# Patient Record
Sex: Male | Born: 1975
Health system: Southern US, Community
[De-identification: ages and names within clinical notes are randomized; demographics above are authoritative.]

## PROBLEM LIST (undated history)

## (undated) DIAGNOSIS — I10 Essential (primary) hypertension: Secondary | ICD-10-CM

## (undated) DIAGNOSIS — G4730 Sleep apnea, unspecified: Secondary | ICD-10-CM

## (undated) DIAGNOSIS — D649 Anemia, unspecified: Secondary | ICD-10-CM

## (undated) DIAGNOSIS — E038 Other specified hypothyroidism: Secondary | ICD-10-CM

## (undated) DIAGNOSIS — I509 Heart failure, unspecified: Secondary | ICD-10-CM

## (undated) DIAGNOSIS — K219 Gastro-esophageal reflux disease without esophagitis: Secondary | ICD-10-CM

## (undated) DIAGNOSIS — E119 Type 2 diabetes mellitus without complications: Secondary | ICD-10-CM

## (undated) DIAGNOSIS — N186 End stage renal disease: Secondary | ICD-10-CM

## (undated) DIAGNOSIS — I480 Paroxysmal atrial fibrillation: Secondary | ICD-10-CM

## (undated) DIAGNOSIS — Z9289 Personal history of other medical treatment: Secondary | ICD-10-CM

## (undated) DIAGNOSIS — N189 Chronic kidney disease, unspecified: Secondary | ICD-10-CM

## (undated) DIAGNOSIS — R7303 Prediabetes: Secondary | ICD-10-CM

## (undated) HISTORY — DX: Morbid (severe) obesity due to excess calories: E66.01

## (undated) HISTORY — PX: KNEE ARTHROSCOPY: SHX127

## (undated) HISTORY — DX: Chronic kidney disease, unspecified: N18.9

## (undated) HISTORY — DX: Paroxysmal atrial fibrillation: I48.0

## (undated) HISTORY — DX: Essential (primary) hypertension: I10

## (undated) HISTORY — DX: Anemia, unspecified: D64.9

## (undated) HISTORY — DX: Type 2 diabetes mellitus without complications: E11.9

## (undated) HISTORY — DX: Other specified hypothyroidism: E03.8

## (undated) HISTORY — DX: Heart failure, unspecified: I50.9

---
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
## 2000-05-05 ENCOUNTER — Encounter: Payer: Self-pay | Admitting: Emergency Medicine

## 2000-05-05 ENCOUNTER — Inpatient Hospital Stay (HOSPITAL_COMMUNITY): Admission: EM | Admit: 2000-05-05 | Discharge: 2000-05-08 | Payer: Self-pay | Admitting: Emergency Medicine

## 2003-03-04 ENCOUNTER — Encounter: Payer: Self-pay | Admitting: Emergency Medicine

## 2003-03-05 ENCOUNTER — Observation Stay (HOSPITAL_COMMUNITY): Admission: AD | Admit: 2003-03-05 | Discharge: 2003-03-05 | Payer: Self-pay | Admitting: Emergency Medicine

## 2003-03-05 ENCOUNTER — Encounter: Payer: Self-pay | Admitting: Specialist

## 2003-09-25 ENCOUNTER — Inpatient Hospital Stay (HOSPITAL_COMMUNITY): Admission: AD | Admit: 2003-09-25 | Discharge: 2003-09-27 | Payer: Self-pay | Admitting: Nephrology

## 2003-10-19 ENCOUNTER — Emergency Department (HOSPITAL_COMMUNITY): Admission: EM | Admit: 2003-10-19 | Discharge: 2003-10-19 | Payer: Self-pay | Admitting: Emergency Medicine

## 2006-06-15 ENCOUNTER — Ambulatory Visit: Payer: Self-pay | Admitting: Emergency Medicine

## 2006-06-15 ENCOUNTER — Inpatient Hospital Stay (HOSPITAL_COMMUNITY): Admission: EM | Admit: 2006-06-15 | Discharge: 2006-06-20 | Payer: Self-pay | Admitting: Emergency Medicine

## 2006-06-19 ENCOUNTER — Encounter: Payer: Self-pay | Admitting: Vascular Surgery

## 2006-06-24 ENCOUNTER — Ambulatory Visit (HOSPITAL_COMMUNITY): Admission: RE | Admit: 2006-06-24 | Discharge: 2006-06-24 | Payer: Self-pay | Admitting: Vascular Surgery

## 2006-08-31 ENCOUNTER — Ambulatory Visit: Payer: Self-pay | Admitting: Internal Medicine

## 2006-10-16 ENCOUNTER — Ambulatory Visit: Payer: Self-pay | Admitting: Surgical

## 2006-10-16 ENCOUNTER — Ambulatory Visit (HOSPITAL_COMMUNITY): Admission: RE | Admit: 2006-10-16 | Discharge: 2006-10-16 | Payer: Self-pay | Admitting: Vascular Surgery

## 2006-10-19 ENCOUNTER — Ambulatory Visit (HOSPITAL_COMMUNITY): Admission: RE | Admit: 2006-10-19 | Discharge: 2006-10-19 | Payer: Self-pay | Admitting: Vascular Surgery

## 2006-11-13 ENCOUNTER — Ambulatory Visit (HOSPITAL_COMMUNITY): Admission: RE | Admit: 2006-11-13 | Discharge: 2006-11-13 | Payer: Self-pay | Admitting: Nephrology

## 2006-12-16 ENCOUNTER — Ambulatory Visit (HOSPITAL_COMMUNITY): Admission: RE | Admit: 2006-12-16 | Discharge: 2006-12-16 | Payer: Self-pay | Admitting: Vascular Surgery

## 2006-12-16 ENCOUNTER — Ambulatory Visit: Payer: Self-pay | Admitting: Vascular Surgery

## 2007-07-31 ENCOUNTER — Ambulatory Visit (HOSPITAL_COMMUNITY): Admission: RE | Admit: 2007-07-31 | Discharge: 2007-07-31 | Payer: Self-pay | Admitting: Vascular Surgery

## 2007-07-31 ENCOUNTER — Ambulatory Visit: Payer: Self-pay | Admitting: Vascular Surgery

## 2007-08-30 ENCOUNTER — Ambulatory Visit: Payer: Self-pay | Admitting: Surgery

## 2007-09-03 ENCOUNTER — Ambulatory Visit (HOSPITAL_COMMUNITY): Admission: RE | Admit: 2007-09-03 | Discharge: 2007-09-03 | Payer: Self-pay | Admitting: Surgery

## 2007-09-03 ENCOUNTER — Ambulatory Visit: Payer: Self-pay | Admitting: Vascular Surgery

## 2007-10-11 ENCOUNTER — Ambulatory Visit (HOSPITAL_COMMUNITY): Admission: RE | Admit: 2007-10-11 | Discharge: 2007-10-11 | Payer: Self-pay | Admitting: Nephrology

## 2007-11-19 ENCOUNTER — Ambulatory Visit (HOSPITAL_COMMUNITY): Admission: RE | Admit: 2007-11-19 | Discharge: 2007-11-19 | Payer: Self-pay | Admitting: Nephrology

## 2007-11-21 ENCOUNTER — Ambulatory Visit: Payer: Self-pay | Admitting: Vascular Surgery

## 2007-11-21 ENCOUNTER — Ambulatory Visit (HOSPITAL_COMMUNITY): Admission: EM | Admit: 2007-11-21 | Discharge: 2007-11-21 | Payer: Self-pay | Admitting: Emergency Medicine

## 2008-02-19 ENCOUNTER — Ambulatory Visit: Payer: Self-pay | Admitting: Vascular Surgery

## 2008-02-19 ENCOUNTER — Ambulatory Visit (HOSPITAL_COMMUNITY): Admission: RE | Admit: 2008-02-19 | Discharge: 2008-02-19 | Payer: Self-pay | Admitting: Vascular Surgery

## 2008-03-15 ENCOUNTER — Ambulatory Visit: Payer: Self-pay | Admitting: Vascular Surgery

## 2008-03-27 ENCOUNTER — Ambulatory Visit: Payer: Self-pay | Admitting: Vascular Surgery

## 2008-03-27 ENCOUNTER — Ambulatory Visit (HOSPITAL_COMMUNITY): Admission: RE | Admit: 2008-03-27 | Discharge: 2008-03-27 | Payer: Self-pay | Admitting: *Deleted

## 2008-04-22 ENCOUNTER — Inpatient Hospital Stay (HOSPITAL_COMMUNITY): Admission: EM | Admit: 2008-04-22 | Discharge: 2008-04-26 | Payer: Self-pay | Admitting: Emergency Medicine

## 2008-05-10 ENCOUNTER — Ambulatory Visit: Payer: Self-pay | Admitting: Vascular Surgery

## 2008-09-27 ENCOUNTER — Ambulatory Visit (HOSPITAL_COMMUNITY): Admission: RE | Admit: 2008-09-27 | Discharge: 2008-09-27 | Payer: Self-pay | Admitting: Vascular Surgery

## 2008-09-27 ENCOUNTER — Ambulatory Visit: Payer: Self-pay | Admitting: Vascular Surgery

## 2008-09-28 ENCOUNTER — Ambulatory Visit (HOSPITAL_COMMUNITY): Admission: RE | Admit: 2008-09-28 | Discharge: 2008-09-28 | Payer: Self-pay | Admitting: Vascular Surgery

## 2008-12-15 ENCOUNTER — Ambulatory Visit (HOSPITAL_COMMUNITY): Admission: RE | Admit: 2008-12-15 | Discharge: 2008-12-15 | Payer: Self-pay | Admitting: Vascular Surgery

## 2008-12-15 ENCOUNTER — Ambulatory Visit: Payer: Self-pay | Admitting: Surgery

## 2008-12-18 ENCOUNTER — Ambulatory Visit (HOSPITAL_COMMUNITY): Admission: RE | Admit: 2008-12-18 | Discharge: 2008-12-18 | Payer: Self-pay | Admitting: *Deleted

## 2009-01-19 ENCOUNTER — Ambulatory Visit: Payer: Self-pay | Admitting: Vascular Surgery

## 2009-01-19 ENCOUNTER — Ambulatory Visit (HOSPITAL_COMMUNITY): Admission: RE | Admit: 2009-01-19 | Discharge: 2009-01-19 | Payer: Self-pay | Admitting: Vascular Surgery

## 2009-02-06 ENCOUNTER — Inpatient Hospital Stay (HOSPITAL_COMMUNITY): Admission: AD | Admit: 2009-02-06 | Discharge: 2009-02-10 | Payer: Self-pay | Admitting: Nephrology

## 2009-02-08 ENCOUNTER — Encounter (INDEPENDENT_AMBULATORY_CARE_PROVIDER_SITE_OTHER): Payer: Self-pay | Admitting: Nephrology

## 2009-02-08 ENCOUNTER — Ambulatory Visit: Payer: Self-pay | Admitting: Surgery

## 2009-03-22 ENCOUNTER — Ambulatory Visit (HOSPITAL_COMMUNITY): Admission: RE | Admit: 2009-03-22 | Discharge: 2009-03-22 | Payer: Self-pay | Admitting: Nephrology

## 2009-04-10 ENCOUNTER — Ambulatory Visit (HOSPITAL_COMMUNITY): Admission: RE | Admit: 2009-04-10 | Discharge: 2009-04-10 | Payer: Self-pay | Admitting: Nephrology

## 2009-05-26 ENCOUNTER — Ambulatory Visit (HOSPITAL_COMMUNITY): Admission: RE | Admit: 2009-05-26 | Discharge: 2009-05-26 | Payer: Self-pay | Admitting: Interventional Radiology

## 2009-07-30 ENCOUNTER — Ambulatory Visit (HOSPITAL_COMMUNITY): Admission: RE | Admit: 2009-07-30 | Discharge: 2009-07-30 | Payer: Self-pay | Admitting: Nephrology

## 2009-09-04 ENCOUNTER — Emergency Department (HOSPITAL_COMMUNITY): Admission: EM | Admit: 2009-09-04 | Discharge: 2009-09-04 | Payer: Self-pay | Admitting: Family Medicine

## 2009-09-28 ENCOUNTER — Ambulatory Visit (HOSPITAL_COMMUNITY): Admission: RE | Admit: 2009-09-28 | Discharge: 2009-09-28 | Payer: Self-pay | Admitting: Nephrology

## 2010-02-17 ENCOUNTER — Ambulatory Visit (HOSPITAL_COMMUNITY)
Admission: RE | Admit: 2010-02-17 | Discharge: 2010-02-17 | Payer: Self-pay | Source: Home / Self Care | Admitting: Nephrology

## 2010-06-17 ENCOUNTER — Ambulatory Visit (HOSPITAL_COMMUNITY)
Admission: RE | Admit: 2010-06-17 | Discharge: 2010-06-17 | Payer: Self-pay | Source: Home / Self Care | Attending: Vascular Surgery | Admitting: Vascular Surgery

## 2010-06-24 LAB — POCT I-STAT 4, (NA,K, GLUC, HGB,HCT)
Glucose, Bld: 92 mg/dL (ref 70–99)
HCT: 47 % (ref 39.0–52.0)
Hemoglobin: 16 g/dL (ref 13.0–17.0)
Potassium: 5.9 mEq/L — ABNORMAL HIGH (ref 3.5–5.1)
Sodium: 129 mEq/L — ABNORMAL LOW (ref 135–145)

## 2010-06-30 ENCOUNTER — Encounter: Payer: Self-pay | Admitting: Nephrology

## 2010-07-01 ENCOUNTER — Encounter: Payer: Self-pay | Admitting: Nephrology

## 2010-07-17 ENCOUNTER — Ambulatory Visit (INDEPENDENT_AMBULATORY_CARE_PROVIDER_SITE_OTHER): Payer: 59 | Admitting: Vascular Surgery

## 2010-07-17 DIAGNOSIS — Z01810 Encounter for preprocedural cardiovascular examination: Secondary | ICD-10-CM

## 2010-07-17 DIAGNOSIS — N186 End stage renal disease: Secondary | ICD-10-CM

## 2010-07-19 NOTE — Assessment & Plan Note (Signed)
OFFICE VISIT  [name], [name] DOB:  January 13, 1976                                       07/17/2010 [id number]  I saw the patient in the office today for evaluation for new access. This is a pleasant 35 year old gentleman who has chronic kidney disease secondary to hypertension.  He has been on dialysis for approximately 3 years.  He has had a previous left forearm AV graft which had several thrombectomies and then ultimately required removal for infection.  He then had an upper arm brachiocephalic fistula on the left, which ultimately failed.  He then had a right brachiocephalic fistula and underwent multiple procedures for this but it has a central venous occlusion on the right and it was felt that no further attempts in the right arm should be done because of this central venous occlusion, which has been recurrent.  Most recently he presented with an occluded right upper arm fistula and on January 9 had placement of a right femoral Diatek catheter.  In reviewing the operative report, it was noted the left internal jugular vein was occluded by a venogram intraoperatively. In addition, it sounds like there was a left brachiocephalic occlusion also.  He had for this reason a right femoral catheter placed.  Of note, he has had no recent uremic symptoms.  Specifically he denies any nausea, vomiting, palpitations, shortness of breath, anorexia or fatigue.  SOCIAL HISTORY:  He is single.  He does not have any children.  He does not use tobacco.  REVIEW OF SYSTEMS:  CARDIOVASCULAR:  He has had no chest pain, chest pressure, palpitations or arrhythmias. PULMONARY:  He has had no productive cough bronchitis, asthma or wheezing.  PHYSICAL EXAMINATION:  This is a pleasant 35 year old gentleman who appears his stated age.  Temperature is 97.5, blood pressure 139/99, heart rate is 113.  Lungs are clear bilaterally to auscultation without rales, rhonchi or  wheezing.  On cardiovascular exam, he has a regular rate and rhythm.  He has palpable femoral and pedal pulses bilaterally. Abdomen is soft and nontender with normal-pitched bowel sounds. Neurologic exam:  There is no focal weakness or paresthesias.  Exam of the extremities reveals chronically-occluded bilateral upper arm clotted fistulas.  I did review his venogram most recently by the interventional radiologist, which shows occlusion of the right brachiocephalic vein. It appeared that the left brachiocephalic vein was patent on a study from June 2000, and I cannot find any more recent studies of the left side.  I did independently interpret his vein mapping today, which shows the basilic vein on the right side appears adequate for a fistula; however, he has the known central venous occlusion on the right.  On the left side the basilic vein does not appear adequate.  In reviewing all the data, I think his only option for further access would be either a graft in the left upper arm if he does not have a central venous occlusion on the left or a thigh graft.  Before proceeding with a thigh graft, I think it is worth obtaining a central venogram to see if the left central veins are patent.  If they are, then we can schedule placement of a left upper arm AV graft.  If not, we will have to schedule him for a thigh graft.  His central venogram has been scheduled for July 22, 2010.  I will make further recommendations pending these results.    Judeth Cornfield. Scot Dock, M.D. Electronically Signed  CSD/MEDQ  D:  07/17/2010  T:  07/18/2010  Job:  [id number]  cc:   [facility]

## 2010-07-22 ENCOUNTER — Ambulatory Visit (HOSPITAL_COMMUNITY)
Admission: RE | Admit: 2010-07-22 | Discharge: 2010-07-22 | Disposition: A | Discharge status: Home / Self Care | Payer: Medicare Other | Source: Ambulatory Visit | Attending: Vascular Surgery | Admitting: Vascular Surgery

## 2010-07-22 DIAGNOSIS — I82B19 Acute embolism and thrombosis of unspecified subclavian vein: Secondary | ICD-10-CM | POA: Insufficient documentation

## 2010-07-22 DIAGNOSIS — N186 End stage renal disease: Secondary | ICD-10-CM | POA: Insufficient documentation

## 2010-07-22 DIAGNOSIS — I120 Hypertensive chronic kidney disease with stage 5 chronic kidney disease or end stage renal disease: Secondary | ICD-10-CM

## 2010-07-22 DIAGNOSIS — T82898A Other specified complication of vascular prosthetic devices, implants and grafts, initial encounter: Secondary | ICD-10-CM

## 2010-07-22 DIAGNOSIS — Z01812 Encounter for preprocedural laboratory examination: Secondary | ICD-10-CM | POA: Insufficient documentation

## 2010-07-22 LAB — POCT I-STAT, CHEM 8
Calcium, Ion: 1.06 mmol/L — ABNORMAL LOW (ref 1.12–1.32)
Creatinine, Ser: 11.3 mg/dL — ABNORMAL HIGH (ref 0.4–1.5)
Glucose, Bld: 65 mg/dL — ABNORMAL LOW (ref 70–99)
Hemoglobin: 12.6 g/dL — ABNORMAL LOW (ref 13.0–17.0)
Sodium: 139 mEq/L (ref 135–145)
TCO2: 30 mmol/L (ref 0–100)

## 2010-07-23 NOTE — Op Note (Signed)
  [name], [name]             ACCOUNT NO.:  [id number]  MEDICAL RECORD NO.:  [id number]           PATIENT TYPE:  O  LOCATION:  SDSC                         FACILITY:  [facility]  PHYSICIAN:  Judeth Cornfield. Scot Dock, M.D.DATE OF BIRTH:  1975/07/06  DATE OF PROCEDURE:  07/22/2010 DATE OF DISCHARGE:  07/22/2010                              OPERATIVE REPORT   PREOPERATIVE DIAGNOSIS:  Chronic kidney disease, rule out left central venous occlusion.  POSTOPERATIVE DIAGNOSIS:  Chronic kidney disease, rule out left central venous occlusion.  PROCEDURE:  Left upper extremity venogram and central venogram.  SURGEON:  Judeth Cornfield. Scot Dock, MD  ANESTHESIA:  None.  INDICATIONS:  This is a 35 year old gentleman with a known right central venous occlusion.  He has had failed fistulas in the left arm and before placing a left arm graft, we elected to obtain a central venogram on the left to be sure there was no central venous occlusion, otherwise the patient would require a thigh graft.  TECHNIQUE:  The patient had an IV started in the left hand.  The patient was taken to the [facility] Lab and contrast was injected through the IV in the left hand.  This demonstrated patency of the brachial vein with no demonstrable basilic vein or cephalic vein in the upper arm on the left. The subclavian vein was occluded.  There was no reconstitution of the brachiocephalic vein on the left.  CONCLUSIONS:  Occluded left subclavian vein and central veins on the left side.     Judeth Cornfield. Scot Dock, M.D.     CSD/MEDQ  D:  07/22/2010  T:  07/22/2010  Job:  [id number]  Electronically Signed by Deitra Mayo M.D. on 07/23/2010 02:20:40 PM

## 2010-07-24 NOTE — Procedures (Unsigned)
CEPHALIC VEIN MAPPING  INDICATION:  End-stage renal disease.  HISTORY: Bilateral history of arteriovenous fistulae, right and left arm. Patient is currently using his right groin area for dialysis access.  EXAM: The right cephalic vein is compressible.  Diameter measurements range from 0.45 to 0.31 cm.  The right basilic vein is compressible.  Diameter measurements range from 0.82 to 0.29 cm.  The left cephalic vein is compressible.  Diameter measurements range from 0.48 to 0.27 cm.  The left basilic vein is compressible.  Diameter measurements range from 0.55 to 0.53 cm.  See attached worksheet for all measurements.  IMPRESSION:  Patent right and left cephalic and basilic veins with diameter measurements as described above.  There is thrombus within bilateral cephalic veins from the antecubital fossa on up.  ___________________________________________ Judeth Cornfield. Scot Dock, M.D.  OD/MEDQ  D:  07/17/2010  T:  07/17/2010  Job:  [id number]

## 2010-08-09 ENCOUNTER — Inpatient Hospital Stay (HOSPITAL_COMMUNITY)
Admission: RE | Admit: 2010-08-09 | Discharge: 2010-08-10 | DRG: 675 | Disposition: A | Discharge status: Home / Self Care | Payer: Medicare Other | Source: Ambulatory Visit | Attending: Vascular Surgery | Admitting: Vascular Surgery

## 2010-08-09 DIAGNOSIS — E669 Obesity, unspecified: Secondary | ICD-10-CM | POA: Diagnosis present

## 2010-08-09 DIAGNOSIS — I120 Hypertensive chronic kidney disease with stage 5 chronic kidney disease or end stage renal disease: Secondary | ICD-10-CM

## 2010-08-09 DIAGNOSIS — N189 Chronic kidney disease, unspecified: Principal | ICD-10-CM | POA: Diagnosis present

## 2010-08-09 DIAGNOSIS — I129 Hypertensive chronic kidney disease with stage 1 through stage 4 chronic kidney disease, or unspecified chronic kidney disease: Secondary | ICD-10-CM | POA: Diagnosis present

## 2010-08-09 DIAGNOSIS — G4733 Obstructive sleep apnea (adult) (pediatric): Secondary | ICD-10-CM | POA: Diagnosis present

## 2010-08-09 DIAGNOSIS — N186 End stage renal disease: Secondary | ICD-10-CM

## 2010-08-09 HISTORY — PX: ARTERIOVENOUS GRAFT PLACEMENT: SUR1029

## 2010-08-09 LAB — SURGICAL PCR SCREEN
MRSA, PCR: NEGATIVE
Staphylococcus aureus: NEGATIVE

## 2010-08-09 LAB — POCT I-STAT 4, (NA,K, GLUC, HGB,HCT)
HCT: 39 % (ref 39.0–52.0)
Hemoglobin: 13.3 g/dL (ref 13.0–17.0)

## 2010-08-10 ENCOUNTER — Inpatient Hospital Stay (HOSPITAL_COMMUNITY)
Admission: RE | Admit: 2010-08-10 | Discharge: 2010-08-10 | Disposition: A | Discharge status: Home / Self Care | Payer: 59 | Source: Ambulatory Visit

## 2010-08-10 LAB — CBC
MCHC: 32.9 g/dL (ref 30.0–36.0)
Platelets: 225 10*3/uL (ref 150–400)
RDW: 15.3 % (ref 11.5–15.5)
WBC: 9.3 10*3/uL (ref 4.0–10.5)

## 2010-08-10 LAB — RENAL FUNCTION PANEL
Albumin: 3.2 g/dL — ABNORMAL LOW (ref 3.5–5.2)
Calcium: 9 mg/dL (ref 8.4–10.5)
Phosphorus: 6.3 mg/dL — ABNORMAL HIGH (ref 2.3–4.6)
Potassium: 5.3 mEq/L — ABNORMAL HIGH (ref 3.5–5.1)
Sodium: 137 mEq/L (ref 135–145)

## 2010-08-12 NOTE — Op Note (Signed)
  [name], [name]             ACCOUNT NO.:  [id number]  MEDICAL RECORD NO.:  [id number]           PATIENT TYPE:  I  LOCATION:  [id number]                         FACILITY:  [facility]  PHYSICIAN:  Judeth Cornfield. Scot Dock, M.D.DATE OF BIRTH:  [birth year]  DATE OF PROCEDURE:  08/09/2010 DATE OF DISCHARGE:                              OPERATIVE REPORT   PREOPERATIVE DIAGNOSIS:  Chronic kidney disease.  POSTOPERATIVE DIAGNOSIS:  Chronic kidney disease.  PROCEDURE:  Placement of a new left thigh arteriovenous graft.  SURGEON:  Judeth Cornfield. Scot Dock, MD  ASSISTANT:  Leta Baptist, PA  ANESTHESIA:  General.  TECHNIQUE:  The patient was taken to the operating room, received a general anesthetic.  The left thigh was prepped and draped in the usual sterile fashion.  The pannus was taped superiorly.  An oblique incision was made in the left groin and through this incision the common femoral artery was dissected free.  It was a descent-sized artery, had no significant plaque, and had a good pulse.  Saphenofemoral junction was then dissected free.  Using one distal counterincision, a 4-7 mm graft was tunneled in a loop fashion in the thigh and the patient was then heparinized.  The common femoral artery was clamped proximally and distally and a longitudinal arteriotomy was made.  A segment of 4 mm end of the graft was excised.  The graft was spatulated and sewn end-to-side to the common femoral artery using continuous 6-0 Prolene suture.  The graft was then pulled to the appropriate length for anastomosis to the saphenofemoral junction.  The vein was ligated distally.  I placed this attempting Satinsky clamp across the saphenofemoral junction and the vein was opened up onto the femoral vein.  The proximal valve was sharply excised.  The graft was then cut to the appropriate length, spatulated, and sewn end to side to the saphenofemoral junction using 2 continuous 6-0 Prolene sutures.  At  completion, there was an excellent thrill in the graft.  Hemostasis was obtained in the wounds and the heparin was partially reversed with protamine.  The counterincision was closed with a deep layer of 3-0 Vicryl and the skin was closed with 4-0 Vicryl.  The groin incision was closed with a deep layer of 2-0 Vicryl. Subcutaneous layer of 2-0 Vicryl and the skin was closed with 4-0 subcuticular stitch.  A sterile dressing was applied.  The patient tolerated the procedure well and was transferred to the recovery room in stable condition.  All needle and sponge counts were correct.    Judeth Cornfield. Scot Dock, M.D.    CSD/MEDQ  D:  08/09/2010  T:  08/09/2010  Job:  [id number]  Electronically Signed by Deitra Mayo M.D. on 08/12/2010 10:41:54 AM

## 2010-08-22 LAB — POTASSIUM: Potassium: 6.4 mEq/L (ref 3.5–5.1)

## 2010-08-30 LAB — CULTURE, ROUTINE-ABSCESS

## 2010-09-05 ENCOUNTER — Ambulatory Visit: Payer: 59

## 2010-09-11 LAB — POTASSIUM: Potassium: 4.9 mEq/L (ref 3.5–5.1)

## 2010-09-12 LAB — POTASSIUM

## 2010-09-13 LAB — CBC
HCT: 33.5 % — ABNORMAL LOW (ref 39.0–52.0)
HCT: 36 % — ABNORMAL LOW (ref 39.0–52.0)
Hemoglobin: 11.3 g/dL — ABNORMAL LOW (ref 13.0–17.0)
Hemoglobin: 12.1 g/dL — ABNORMAL LOW (ref 13.0–17.0)
Hemoglobin: 13.1 g/dL (ref 13.0–17.0)
MCHC: 33.6 g/dL (ref 30.0–36.0)
MCHC: 33.6 g/dL (ref 30.0–36.0)
MCV: 95.9 fL (ref 78.0–100.0)
Platelets: 196 10*3/uL (ref 150–400)
RDW: 15.7 % — ABNORMAL HIGH (ref 11.5–15.5)
RDW: 15.9 % — ABNORMAL HIGH (ref 11.5–15.5)
RDW: 16.2 % — ABNORMAL HIGH (ref 11.5–15.5)
WBC: 10.4 10*3/uL (ref 4.0–10.5)

## 2010-09-13 LAB — COMPREHENSIVE METABOLIC PANEL
CO2: 24 mEq/L (ref 19–32)
Calcium: 8.6 mg/dL (ref 8.4–10.5)
Creatinine, Ser: 17.04 mg/dL — ABNORMAL HIGH (ref 0.4–1.5)
GFR calc non Af Amer: 3 mL/min — ABNORMAL LOW (ref >60)
Glucose, Bld: 85 mg/dL (ref 70–99)
Sodium: 133 mEq/L — ABNORMAL LOW (ref 135–145)

## 2010-09-13 LAB — DIFFERENTIAL
Lymphocytes Relative: 11 % — ABNORMAL LOW (ref 12–46)
Lymphs Abs: 1.1 10*3/uL (ref 0.7–4.0)
Neutrophils Relative %: 73 % (ref 43–77)

## 2010-09-13 LAB — RENAL FUNCTION PANEL
Albumin: 3.1 g/dL — ABNORMAL LOW (ref 3.5–5.2)
BUN: 49 mg/dL — ABNORMAL HIGH (ref 6–23)
BUN: 64 mg/dL — ABNORMAL HIGH (ref 6–23)
CO2: 22 mEq/L (ref 19–32)
Calcium: 8.9 mg/dL (ref 8.4–10.5)
Chloride: 101 mEq/L (ref 96–112)
Creatinine, Ser: 14.06 mg/dL — ABNORMAL HIGH (ref 0.4–1.5)
Creatinine, Ser: 16.23 mg/dL — ABNORMAL HIGH (ref 0.4–1.5)
GFR calc Af Amer: 4 mL/min — ABNORMAL LOW (ref >60)
Glucose, Bld: 82 mg/dL (ref 70–99)

## 2010-09-13 LAB — PHOSPHORUS: Phosphorus: 6.8 mg/dL — ABNORMAL HIGH (ref 2.3–4.6)

## 2010-09-13 LAB — PROTIME-INR: INR: 1.1 (ref 0.00–1.49)

## 2010-09-14 LAB — POCT I-STAT 4, (NA,K, GLUC, HGB,HCT)
Glucose, Bld: 91 mg/dL (ref 70–99)
HCT: 50 % (ref 39.0–52.0)
Hemoglobin: 17 g/dL (ref 13.0–17.0)
Potassium: 4.4 meq/L (ref 3.5–5.1)
Sodium: 134 meq/L — ABNORMAL LOW (ref 135–145)

## 2010-09-14 LAB — RENAL FUNCTION PANEL
Albumin: 3.2 g/dL — ABNORMAL LOW (ref 3.5–5.2)
BUN: 96 mg/dL — ABNORMAL HIGH (ref 6–23)
CO2: 19 meq/L (ref 19–32)
Calcium: 9.1 mg/dL (ref 8.4–10.5)
Chloride: 98 meq/L (ref 96–112)
Creatinine, Ser: 22.04 mg/dL — ABNORMAL HIGH (ref 0.4–1.5)
GFR calc non Af Amer: 2 mL/min — ABNORMAL LOW
Glucose, Bld: 80 mg/dL (ref 70–99)
Phosphorus: 5 mg/dL — ABNORMAL HIGH (ref 2.3–4.6)
Potassium: 6.4 meq/L (ref 3.5–5.1)
Sodium: 135 meq/L (ref 135–145)

## 2010-09-14 LAB — CBC
HCT: 34.5 % — ABNORMAL LOW (ref 39.0–52.0)
MCHC: 33.8 g/dL (ref 30.0–36.0)
MCHC: 34.2 g/dL (ref 30.0–36.0)
MCV: 95 fL (ref 78.0–100.0)
Platelets: 216 10*3/uL (ref 150–400)
Platelets: 81 10*3/uL — ABNORMAL LOW (ref 150–400)
RDW: 16.1 % — ABNORMAL HIGH (ref 11.5–15.5)
RDW: 16.1 % — ABNORMAL HIGH (ref 11.5–15.5)

## 2010-09-14 LAB — PROTIME-INR: INR: 1.9 — ABNORMAL HIGH (ref 0.00–1.49)

## 2010-09-15 LAB — CATH TIP CULTURE: Culture: 10

## 2010-09-15 LAB — POCT I-STAT 4, (NA,K, GLUC, HGB,HCT)
HCT: 43 % (ref 39.0–52.0)
Hemoglobin: 14.6 g/dL (ref 13.0–17.0)
Sodium: 133 mEq/L — ABNORMAL LOW (ref 135–145)

## 2010-09-16 ENCOUNTER — Other Ambulatory Visit (HOSPITAL_COMMUNITY): Payer: Self-pay | Admitting: Nephrology

## 2010-09-16 DIAGNOSIS — N186 End stage renal disease: Secondary | ICD-10-CM

## 2010-09-18 ENCOUNTER — Ambulatory Visit (HOSPITAL_COMMUNITY)
Admission: RE | Admit: 2010-09-18 | Discharge: 2010-09-18 | Disposition: A | Discharge status: Home / Self Care | Payer: Medicare Other | Source: Ambulatory Visit | Attending: Nephrology | Admitting: Nephrology

## 2010-09-18 ENCOUNTER — Encounter (HOSPITAL_COMMUNITY): Payer: 59

## 2010-09-18 ENCOUNTER — Other Ambulatory Visit (HOSPITAL_COMMUNITY): Payer: Self-pay | Admitting: Nephrology

## 2010-09-18 DIAGNOSIS — N186 End stage renal disease: Secondary | ICD-10-CM

## 2010-09-18 DIAGNOSIS — G4730 Sleep apnea, unspecified: Secondary | ICD-10-CM | POA: Insufficient documentation

## 2010-09-18 DIAGNOSIS — T82898A Other specified complication of vascular prosthetic devices, implants and grafts, initial encounter: Secondary | ICD-10-CM | POA: Insufficient documentation

## 2010-09-18 DIAGNOSIS — Y832 Surgical operation with anastomosis, bypass or graft as the cause of abnormal reaction of the patient, or of later complication, without mention of misadventure at the time of the procedure: Secondary | ICD-10-CM | POA: Insufficient documentation

## 2010-09-18 LAB — POCT I-STAT 4, (NA,K, GLUC, HGB,HCT)
Glucose, Bld: 78 mg/dL (ref 70–99)
Potassium: 4.8 mEq/L (ref 3.5–5.1)

## 2010-09-18 MED ORDER — IOHEXOL 300 MG/ML  SOLN
100.0000 mL | Freq: Once | INTRAMUSCULAR | Status: AC | PRN
  Administered 2010-09-18: 35 mL via INTRAVENOUS

## 2010-09-20 MED ORDER — IOHEXOL 300 MG/ML  SOLN
100.0000 mL | Freq: Once | INTRAMUSCULAR | Status: AC | PRN
  Administered 2010-09-20: 40 mL via INTRAVENOUS

## 2010-09-25 ENCOUNTER — Ambulatory Visit (HOSPITAL_COMMUNITY): Payer: Medicare Other | Attending: Vascular Surgery

## 2010-09-25 DIAGNOSIS — N186 End stage renal disease: Secondary | ICD-10-CM | POA: Insufficient documentation

## 2010-09-25 DIAGNOSIS — I120 Hypertensive chronic kidney disease with stage 5 chronic kidney disease or end stage renal disease: Secondary | ICD-10-CM

## 2010-09-25 DIAGNOSIS — Z452 Encounter for adjustment and management of vascular access device: Secondary | ICD-10-CM

## 2010-10-01 ENCOUNTER — Other Ambulatory Visit (HOSPITAL_COMMUNITY): Payer: Self-pay | Admitting: Nephrology

## 2010-10-01 DIAGNOSIS — N186 End stage renal disease: Secondary | ICD-10-CM

## 2010-10-02 ENCOUNTER — Other Ambulatory Visit (HOSPITAL_COMMUNITY): Payer: Self-pay | Admitting: Nephrology

## 2010-10-02 ENCOUNTER — Ambulatory Visit (HOSPITAL_COMMUNITY)
Admission: RE | Admit: 2010-10-02 | Discharge: 2010-10-02 | Disposition: A | Discharge status: Home / Self Care | Payer: Medicare Other | Source: Ambulatory Visit | Attending: Nephrology | Admitting: Nephrology

## 2010-10-02 DIAGNOSIS — N186 End stage renal disease: Secondary | ICD-10-CM

## 2010-10-02 DIAGNOSIS — D649 Anemia, unspecified: Secondary | ICD-10-CM | POA: Insufficient documentation

## 2010-10-02 DIAGNOSIS — E669 Obesity, unspecified: Secondary | ICD-10-CM | POA: Insufficient documentation

## 2010-10-02 DIAGNOSIS — N2581 Secondary hyperparathyroidism of renal origin: Secondary | ICD-10-CM | POA: Insufficient documentation

## 2010-10-02 DIAGNOSIS — I509 Heart failure, unspecified: Secondary | ICD-10-CM | POA: Insufficient documentation

## 2010-10-02 DIAGNOSIS — I4891 Unspecified atrial fibrillation: Secondary | ICD-10-CM | POA: Insufficient documentation

## 2010-10-02 DIAGNOSIS — T82898A Other specified complication of vascular prosthetic devices, implants and grafts, initial encounter: Secondary | ICD-10-CM | POA: Insufficient documentation

## 2010-10-02 DIAGNOSIS — G4733 Obstructive sleep apnea (adult) (pediatric): Secondary | ICD-10-CM | POA: Insufficient documentation

## 2010-10-02 DIAGNOSIS — Y832 Surgical operation with anastomosis, bypass or graft as the cause of abnormal reaction of the patient, or of later complication, without mention of misadventure at the time of the procedure: Secondary | ICD-10-CM | POA: Insufficient documentation

## 2010-10-02 MED ORDER — IOHEXOL 300 MG/ML  SOLN
100.0000 mL | Freq: Once | INTRAMUSCULAR | Status: AC | PRN
  Administered 2010-10-02: 80 mL via INTRAVENOUS

## 2010-10-04 ENCOUNTER — Ambulatory Visit (HOSPITAL_COMMUNITY)
Admission: RE | Admit: 2010-10-04 | Discharge: 2010-10-04 | Disposition: A | Discharge status: Home / Self Care | Payer: Medicare Other | Source: Ambulatory Visit | Attending: Vascular Surgery | Admitting: Vascular Surgery

## 2010-10-04 ENCOUNTER — Ambulatory Visit (HOSPITAL_COMMUNITY): Payer: Medicare Other

## 2010-10-04 DIAGNOSIS — F17200 Nicotine dependence, unspecified, uncomplicated: Secondary | ICD-10-CM | POA: Insufficient documentation

## 2010-10-04 DIAGNOSIS — I120 Hypertensive chronic kidney disease with stage 5 chronic kidney disease or end stage renal disease: Secondary | ICD-10-CM | POA: Insufficient documentation

## 2010-10-04 DIAGNOSIS — I509 Heart failure, unspecified: Secondary | ICD-10-CM | POA: Insufficient documentation

## 2010-10-04 DIAGNOSIS — T82898A Other specified complication of vascular prosthetic devices, implants and grafts, initial encounter: Secondary | ICD-10-CM

## 2010-10-04 DIAGNOSIS — Y832 Surgical operation with anastomosis, bypass or graft as the cause of abnormal reaction of the patient, or of later complication, without mention of misadventure at the time of the procedure: Secondary | ICD-10-CM | POA: Insufficient documentation

## 2010-10-04 DIAGNOSIS — N186 End stage renal disease: Secondary | ICD-10-CM | POA: Insufficient documentation

## 2010-10-04 DIAGNOSIS — G4733 Obstructive sleep apnea (adult) (pediatric): Secondary | ICD-10-CM | POA: Insufficient documentation

## 2010-10-04 LAB — POCT I-STAT 4, (NA,K, GLUC, HGB,HCT)
HCT: 37 % — ABNORMAL LOW (ref 39.0–52.0)
Hemoglobin: 12.6 g/dL — ABNORMAL LOW (ref 13.0–17.0)
Potassium: 4.8 mEq/L (ref 3.5–5.1)
Sodium: 136 mEq/L (ref 135–145)

## 2010-10-05 HISTORY — PX: INSERTION OF DIALYSIS CATHETER: SHX1324

## 2010-10-06 NOTE — Op Note (Signed)
[name], [name]             ACCOUNT NO.:  [id number]  MEDICAL RECORD NO.:  [id number]           PATIENT TYPE:  LOCATION:                                 FACILITY:  PHYSICIAN:  Conrad Cane [place], MD       DATE OF BIRTH:  1975/08/14  DATE OF PROCEDURE: DATE OF DISCHARGE:                              OPERATIVE REPORT   PROCEDURE: right femoral vein cannulation under ultrasound guidance and placement of a right femoral vein tunneled dialysis catheter.  PREOPERATIVE DIAGNOSIS:  End-stage renal disease on hemodialysis and poorly functioning left thigh arteriovenous graft.  POSTOPERATIVE DIAGNOSIS:  End-stage renal disease on hemodialysis and poorly functioning left thigh arteriovenous graft.  SURGEON:  Conrad [place], MD  ANESTHESIA:  General and LMA along with local.  FINDINGS:  Tips of the tunneled dialysis catheter in the IVC/right atrium.  SPECIMENS:  None.  ESTIMATED BLOOD LOSS:  Minimal.  INDICATIONS:  This is a 35 year old patient with previous left thigh arteriovenous graft, apparently was functioning poorly, and went to Interventional Radiology yesterday for a percutaneous thrombectomy and angioplasty of the venous outflow.  Apparently, the patient continued to have poor function in this arteriovenous graft at dialysis today and was not able to complete his dialysis run.  He subsequently was sent to the hospital for placement of a tunneled dialysis catheter.  As I felt that any exposure of a recently angioplastied venous anastomosis in the left groin would be potentially dangerous given the recently angioplastied status of this left femoral vein.  The patient is aware of the risks of this procedure which include potential injury to central venous structures and possible pneumothorax.  The patient is aware of the risks and agreed to proceed forward as such.  DESCRIPTION OF THE OPERATION:  After full informed written consent was obtained from the patient, he was  brought back to the operating room and placed supine upon the operating table.  He was given IV antibiotics prior to induction.  After obtaining adequate anesthesia, he was then prepped and draped in a standard fashion for a femoral catheter placement.  Note, I had looked at his internal jugular veins prior to proceeding with this case and he was noted to have thrombosed internal jugular veins.  His right femoral vein appeared to be open.  After prepping and draping this patient in a standard fashion for a right femoral catheter placement, I interrogated the right groin under ultrasound guidance, and I cannulated the femoral vein with a 18 gauge needle and passed a wire up into the patient's right atrium/inferior vena cava junction.  At this point, I clamped the wire to the drapes.  I then made stab incisions at the cannulation site and also slightly lateral on the thigh for the exit site of this catheter.  Using a metal tunneler, I then dissected from the lateral incision to the cannulation incision and then dilated this with a dilator over this metal tunneler.  At this point, I unclamped the wire and then removed the needle and then serially dilated up to the skin tract and venotomy.  Eventually, I loaded the dilator sheath  over the wire and advanced it into the iliac vein.  At this point, then I took a 55-cm Diatek catheter and it was placed over the wire and under fluoroscopic guidance was advanced up to the level of the right atrium/inferior vena cava.  I then removed the wire and then I broke the sheath and peeled it away while holding the cuff in place. The back end of this catheter was then connected to a metal tunneler and passed through the subcutaneous tunnel and pulled back to catheter so that the cuff was within the centimeter of the exit site.  I then under fluoroscopic guidance checked the position of this catheter tips there in the right atrium/inferior vena cava.  I then  transected the back end of this catheter revealing the two lumens.  The two lumens were docked onto the two ports and then a catheter hub was then screwed into place.  I aspirated each port with no resistance and flushed without any problems. I loaded each port with heparinized saline.  At this point, the catheter was secured in place with two 3-0 nylon stitches tied to the catheter.  The groin incision was then reapproximated with a U stitch of 4-0 Vicryl.  The skin was cleaned, dried, and Dermabond was used to reinforce the groin incision.  At this point, sterile bandages were applied to the incision sites.  Each port of this catheter was then loaded with concentrated heparin (1000 unit/mL). The manufacturer recommended volumes for each port were given.  At this point, the patient was allowed to awaken with plan to discharge home.  COMPLICATIONS:  None.  CONDITION:  Stable.     Conrad [place], MD     BLC/MEDQ  D:  10/04/2010  T:  10/05/2010  Job:  [id number]  Electronically Signed by [name] MD on 10/06/2010 07:04:23 PM

## 2010-10-08 ENCOUNTER — Encounter: Payer: Self-pay | Admitting: Vascular Surgery

## 2010-10-22 NOTE — Assessment & Plan Note (Signed)
OFFICE VISIT   [name], [name]  DOB:  06-24-1975                                       05/10/2008  [id number]   The patient returns for follow-up today.  He had removal of a left  forearm AV graft several weeks ago.  On exam today, the incisions are  well-healed.  He has a palpable left radial pulse.  Hand is warm and  well-perfused.  He has no numbness or tingling.  Sutures were removed  today.  He is currently dialyzing via right-sided Diatek catheter.  He  has had 6 access procedures in the last 5 months and currently is not  interested in any other access procedures.  I discussed with him the  risk of infection with long-term catheter use, he understands this and  wishes to defer any further access procedures for right now.  He will  follow up on an as-needed basis.  Sutures were removed today.   Jessy Oto. Fields, MD  Electronically Signed   CEF/MEDQ  D:  05/10/2008  T:  05/11/2008  Job:  [id number]   cc:   [facility]

## 2010-10-22 NOTE — Op Note (Signed)
NAMEPRIEST, GARG             ACCOUNT NO.:  [id number]   MEDICAL RECORD NO.:  [id number]          PATIENT TYPE:  AMB   LOCATION:  SDS                          FACILITY:  [facility]   PHYSICIAN:  Charles E. Fields, MD  DATE OF BIRTH:  April 29, 1976   DATE OF PROCEDURE:  09/28/2008  DATE OF DISCHARGE:  09/28/2008                               OPERATIVE REPORT   PROCEDURES:  1. Ultrasound of neck  2. Left internal jugular vein Diatek catheter.   PREOPERATIVE DIAGNOSIS:  End-stage renal disease.   POSTOPERATIVE DIAGNOSIS:  End-stage renal disease.   ANESTHESIA:  Local with IV sedation.   ASSISTANT:  Nurse.   OPERATIVE FINDINGS:  A 32-cm Diatek catheter, left internal jugular  vein.   OPERATIVE DETAILS:  After obtaining informed consent, the patient was  taken to the operating room.  The patient was placed in the supine  position on the operating table.  After adequate sedation, the patient's  neck was inspected with an ultrasound.  The left internal jugular vein  was found to be widely patent with normal compressibility and  respiratory variation.  Next, the patient's entire neck and chest were  prepped and draped in usual sterile fashion.  Local anesthesia was  infiltrated over the left internal jugular vein.  Using ultrasound  guidance, the left internal jugular vein was successfully cannulated  with an introducer needle.  A 0.035 J-tip guidewire was then threaded  through the introducer needle down into the left internal jugular vein  across the innominate vein and down into the inferior vena cava under  fluoroscopic guidance.  Next, sequential 12, 14, and 16-French dilators  were placed over the guidewire down in the right atrium.  The dilator  was removed, and a 32-cm Diatek catheter was placed using a weave  technique over the guidewire through the peel-away sheath down into the  right atrium.  The peel-away sheath was then removed.  The guidewire was  also removed.   Catheters were noted to flush and draw easily.  Catheter  was tunneled subcutaneously, cut to length, and hub attached.  Catheter  was then loaded with concentrated heparin solution.  Catheter was  checked under fluoroscopy, found to have its tip in the right atrium.  No kinks throughout its course.  Catheter was sutured to the skin with  nylon sutures.  The neck insertion site was closed with Vicryl stitch.  The patient tolerated the procedure well and there were no  complications.  Instrument, sponge, and needle counts were correct at  the end of the case.  The patient was taken to the recovery room in  stable condition.      Jessy Oto. Fields, MD  Electronically Signed     CEF/MEDQ  D:  09/28/2008  T:  09/29/2008  Job:  [id number]

## 2010-10-22 NOTE — Discharge Summary (Signed)
[name], [name]             ACCOUNT NO.:  [id number]   MEDICAL RECORD NO.:  [id number]          PATIENT TYPE:  INP   LOCATION:  5525                         FACILITY:  [facility]   PHYSICIAN:  Alvin C. Florene Glen, M.D.  DATE OF BIRTH:  [birth year]   DATE OF ADMISSION:  06/15/2006  DATE OF DISCHARGE:  06/20/2006                               DISCHARGE SUMMARY   ADMITTING DIAGNOSES:  1. Malignant hypertension.  2. Chronic kidney disease stage V, secondary to hypertension.  3. Hyperkalemia.  4. Anemia of chronic disease.  5. Secondary hyperparathyroidism.  6. Dilated cardiomyopathy   DISCHARGE DIAGNOSES:  1. New end-stage renal disease secondary to hypertension now on      chronic hemodialysis.  2. Status post placement of right internal jugular Canon II dialysis      catheter, Dr. Ruta Hinds, June 16, 2006.  3. Hypertension, controlled.  4. Anemia of chronic disease.  5. Secondary hyperparathyroidism.  6. Hyperkalemia corrected with dialysis.   1. Status post bilateral upper extremity vein mapping.  2. Possible obstructive sleep apnea for outpatient sleep study.   BRIEF HISTORY:  A 35 year old black male with chronic kidney disease,  malignant uncontrolled hypertension, obesity, and medical compliance was  transferred to the emergency room from [facility] secondary to severe  hypertension and a systolic blood pressure of 220/112.  He presented to  [facility] due to worsening sore throat, shortness of breath, and blurred  vision to the left eye.  He denies fever, chills, or night sweats.  He  complains of a headache since nitroglycerin drip was started in the  emergency room.  He denies cough, hemoptysis, otorrhea, or tinnitus, but  he does have some head congestion.  The patient has not seen a physician  or taken medicines in approximately 6 months.  He is admitted now with  what appears to be end-stage renal disease, uremia, hyperkalemia, and  volume overload.   ADMISSION  LABORATORY:  White count 10,400, hemoglobin 10.7, platelets  271,000.  Sodium 132, potassium 6.6, chloride 106, CO2 21, glucose 97,  BUN 133, creatinine 17.  LFTs within normal limits.   HOSPITAL COURSE:  The patient was given Kayexalate on admission along  with IV Lasix, IV labetalol, oral clonidine, metoprolol, and Norvasc.  His blood pressure was coming down nicely and on June 16, 2006, he  underwent a right IJ Canon II dialysis catheter placement.  There were  no postoperative complications and he had dialysis postoperatively.  He  tolerated dialysis well with approximately 1.6 liters of ultrafiltrate  removed and no drop in blood pressure.  He dialyzed three consecutive  days with the lowest obtained weight of 133.4 kg down approximately 9 kg  from admission.  Blood pressures during the third dialysis treatments  were low with systolics in the [id number].  Antihypertensive medications were  adjusted and titrated downward.  His fluid overload hyperkalemia  resolved with dialysis.  Outpatient arrangements were made for chronic  hemodialysis at the [facility] every Tuesday, Thursday,  Saturday second shift.  Aranesp was given for anemia and EPO will be  continued as an  outpatient.  Intact PTH level returned at 549.4, and for  this vitamin D therapy will be initiated as an outpatient at the [facility].  Vein mapping was done of bilateral upper extremities and all  vessels appeared patent and of adequate size.  Followup will be with  CV[facility] for permanent access placement as an outpatient.   Pulmonary was consulted due to the patient's obstructive sleep apnea.  They arranged for outpatient followup with Dr. Halford Chessman, for a sleep study.   DISCHARGE MEDICATIONS:  1. Nephro-Vite vitamin one daily.  2. Metoprolol 100 mg b.i.d.  3. Clonidine 0.1 mg b.i.d.  4. Norvasc 10 mg q.h.s.  5. Calcium carbonate 500 mg, two with each meal.  6. EPO 10,000 units IV each dialysis.  7. Vitamin D  will be started as an outpatient.   DRY WEIGHT:  Estimated at 133 kg.   FOLLOWUP:  [facility] for a sleep study      Nonah Mattes, P.A.      Darrold Span. Florene Glen, M.D.  Electronically Signed    RRK/MEDQ  D:  10/01/2006  T:  10/01/2006  Job:  [id number]

## 2010-10-22 NOTE — Assessment & Plan Note (Signed)
OFFICE VISIT   NATHANIAL, RIESEN  DOB:  1976-05-11                                       08/30/2007  [id number]   REASON FOR VISIT:  Evaluate dialysis access.   HISTORY:  This is a 35 year old gentleman with end-stage renal disease,  who previously was dialyzing through a left upper arm cephalic vein  fistula, which clotted off approximately one month ago.  He is now  dialyzing through a right-sided catheter.  He comes in today for  permanent access and discussion.   PHYSICAL EXAMINATION:  Blood pressure is 138/87, pulse 68.  This is a  well-appearing, well-developed gentleman in no distress.  He is right-  handed.  He has a palpable left brachial and radial pulse and a  thrombosed left cephalic vein fistula.   I had a lengthy discussion with the patient regarding our options at  this time.  He elected to proceed with continuing dialysis through his  left, nondominant arm.  For that reason, I have discussed placing a left  forearm graft.  I will plan on tying into his brachial or basilic vein.  I discussed the risks of the procedure, including bleeding, infection,  and steal syndrome.  He is aware of this.  Patient is scheduled for a  left forearm graft on Friday, the 27th.   Eldridge Abrahams, MD  Electronically Signed   VWB/MEDQ  D:  08/30/2007  T:  08/31/2007  Job:  [id number]

## 2010-10-22 NOTE — Op Note (Signed)
[name], [name]             ACCOUNT NO.:  [id number]   MEDICAL RECORD NO.:  [id number]          PATIENT TYPE:  AMB   LOCATION:  SDS                          FACILITY:  [facility]   PHYSICIAN:  Charles E. Fields, MD  DATE OF BIRTH:  30-Sep-1975   DATE OF PROCEDURE:  03/27/2008  DATE OF DISCHARGE:                               OPERATIVE REPORT   PROCEDURES:  1. Ultrasound of neck  2. Insertion of right internal jugular vein Diatek catheter.   PREOPERATIVE DIAGNOSIS:  End-stage renal disease.   POSTOPERATIVE DIAGNOSIS:  End-stage renal disease.   ANESTHESIA:  Local with IV sedation.   ASSISTANT:  Nurse.   OPERATIVE FINDINGS:  A 23-cm Diatek catheter, right internal jugular  vein.   OPERATIVE DETAILS:  After obtaining informed consent, the patient was  taken to the operating room.  The patient was placed in the supine  position on the operating table.  After adequate sedation, the patient's  entire neck and chest were prepped and draped in the usual sterile  fashion.  Ultrasound was used to localize the right internal jugular  vein.  Local anesthesia was then infiltrated over the area of the right  internal jugular vein at the base of the neck.  An introducer needle was  then used to cannulate the right internal jugular vein under ultrasound  guidance.  Next, a 0.035 J-tip guidewire was threaded through the right  internal jugular vein, however, this would not advance easily down into  the innominate or superior vena cava system.  Therefore, the J-wire was  pulled back and a 0.035 angled Glidewire was manipulated down into the  superior vena cava and down in the inferior vena cava under fluoroscopic  guidance.  Next, a 5-French H1 catheter was threaded over the Glidewire  down into the inferior vena cava.  The Glidewire was then exchanged back  for the 0.035 J-wire.  The H1 catheter was then removed.  Next,  sequential 12, 14, 16-French dilators with peel-away sheath were  placed  over the guidewire in the right atrium.  Guidewire and dilator were  removed and a 23-cm Diatek catheter was placed through the peel-away  sheath down in the right atrium.  Catheter was then tunneled  subcutaneously, cut to length and hub attached.  The catheter was noted  to flush and draw easily.  The catheter was inspected under fluoroscopy  and found its tip to be in the right atrium, no kinks throughout its  course.  The catheter was sutured to skin with nylon sutures.  The neck  insertion site was closed with Vicryl stitch.  Catheter was then loaded  with concentrated heparin solution.  A dry sterile dressing was applied.  Catheter was again inspected, found its tip to be in the right atrium,  no kinks throughout its course.  The patient tolerated procedure well  and there were no complications.  Instrument, sponge, and needle counts  were correct at the end of the case.  The patient was taken to the  recovery room in stable condition.      Jessy Oto. Oneida Alar, MD  Electronically Signed     CEF/MEDQ  D:  03/27/2008  T:  03/28/2008  Job:  [id number]

## 2010-10-22 NOTE — Op Note (Signed)
NAMEMASONJAMES, SCHIEFFER             ACCOUNT NO.:  [id number]   MEDICAL RECORD NO.:  [id number]          PATIENT TYPE:  AMB   LOCATION:  SDS                          FACILITY:  [facility]   PHYSICIAN:  Judeth Cornfield. Scot Dock, M.D.DATE OF BIRTH:  21-May-1976   DATE OF PROCEDURE:  01/19/2009  DATE OF DISCHARGE:  01/19/2009                               OPERATIVE REPORT   PREOPERATIVE DIAGNOSIS:  Chronic kidney disease.   POSTOPERATIVE DIAGNOSIS:  Chronic kidney disease.   PROCEDURES:  1. Ultrasound-guided placement of a left internal jugular Palindrome      catheter.  2. Removal of right internal jugular Diatek.   SURGEON:  Judeth Cornfield. Scot Dock, MD   ANESTHESIA:  Local with sedation.   TECHNIQUE:  The patient was taken to the operating room and using the  ultrasound scanner, it appeared that the left IJ was patent.  The neck  and upper chest were prepped and draped in usual sterile fashion.  The  old catheter was prepped into the field.  After that, the skin was  anesthetized with 1% lidocaine and with the patient in Trendelenburg,  the left IJ was cannulated, had some difficulty in threading the wire,  although I think he was dry and was also snoring quite a bit, but I was  able to get an angled Glidewire down into the right atrium.  I exchanged  this wire for a stiffer Wholey wire and then the catheter was selected.  We selected a 28 cm Palindrome.  The sheath over the wire was dilated,  and then the dilator and sheath were advanced over the wire and the 28-  cm catheter advanced down into the right atrium.  The exit site for the  catheter was selected, and I was able to tunnel the catheter and then  positioned the catheter in the right atrium fairly far low.  One of the  ports would withdraw easily; however, the other port was not  functioning.  I twisted the catheter and then it was vice versa one port  would retract finding the other one would not.  I pulled the catheter in  several locations but ultimately had to pull the catheter way up in  order to function well.  I, therefore, elected to use a 23-cm catheter.  At the IJ site, the catheter was brought into the wound and then clamped  and divided.  The wire was advanced over the old catheter and the old  catheter was removed.  The tract over this wire was then dilated and  after the 23-cm catheter had been tunneled, the dilator and peel-away  sheath were advanced over the wire and then the wire and dilator removed  and the catheter advanced through the peel-away sheath and positioned  into the right atrium.  At this point, both ports were withdrawing  nicely.  The catheter was flushed with heparinized saline and then it  was secured at its exit site with 3-0 nylon sutures.  The IJ cannulation  site was closed with a 4-0 subcuticular stitch.  A sterile dressing was  applied.  The old catheter was  removed and pressure held for hemostasis.  All needle and sponge counts were correct.      Judeth Cornfield. Scot Dock, M.D.  Electronically Signed    CSD/MEDQ  D:  01/19/2009  T:  01/20/2009  Job:  [id number]

## 2010-10-22 NOTE — Assessment & Plan Note (Signed)
OFFICE VISIT   [name], [name]  DOB:  06-May-1976                                       03/15/2008  [id number]   The patient presents today for evaluation of his left arm AV graft with  possible infection.  He apparently had some drainage from a recent  revision site.  This was revised by Dr. Donnetta Hutching on 02/19/2008.  Culture of  this was taken which showed gram positive cocci which grew out staph  that was sensitive to vanc and oxacillin.  He has been getting Ancef  with hemodialysis.  He states that the drainage stopped over the last 24-  36 hours.  He had been draining from his arm for 10 days prior to that.   PHYSICAL EXAMINATION:  On physical exam today there is an audible bruit  in the graft.  There is a 2 mm sinus tract at the revision site just  adjacent to the antecubital region.  There is no purulent drainage.  There is no erythema.   The patient has no evidence of systemic signs of illness such as fever  at this time.   I believe the patient should continue his antibiotics for a couple more  weeks.  He will follow up with me at that time.  If the graft has more  pointing signs and develops erythema or purulent drainage despite  antibiotics we would need to consider graft removal.  Hopefully, since  this sinus tract has quit draining this is evidence that the antibiotics  may have resolved any bacterial infection.  He will follow up with me in  two weeks' time.   Jessy Oto. Fields, MD  Electronically Signed   CEF/MEDQ  D:  03/15/2008  T:  03/16/2008  Job:  [id number]   cc:   Windy Kalata, M.D.

## 2010-10-22 NOTE — Op Note (Signed)
NAMEGRANTLEY, STAN             ACCOUNT NO.:  [id number]   MEDICAL RECORD NO.:  [id number]          PATIENT TYPE:  OBV   LOCATION:  2550                         FACILITY:  [facility]   PHYSICIAN:  Nelda Severe. Kellie Simmering, M.D.  DATE OF BIRTH:  Apr 30, 1976   DATE OF PROCEDURE:  11/21/2007  DATE OF DISCHARGE:                               OPERATIVE REPORT   PREOPERATIVE DIAGNOSES:  Thrombosed arteriovenous Gore-Tex graft, left  arm with end-stage renal disease.   POSTOPERATIVE DIAGNOSES:  Thrombosed arteriovenous Gore-Tex graft, left  arm with end-stage renal disease.   OPERATION:  Thrombectomy AV Gore-Tex graft, left arm with revision of  venous end with an interposition 6-mm Gore-Tex graft more proximal  brachial vein.   SURGEON:  Nelda Severe. Kellie Simmering, MD.   FIRST ASSISTANT:  Nurse.   ANESTHESIA:  Local.   PROCEDURE:  The patient was taken to the operating room, placed in the  supine position, at which time, the left upper extremity was prepped  with Betadine scrub and solution and draped in routine sterile manner.  After infiltration of 1% Xylocaine, a longitudinal incision was made  over the venous anastomosis in the antecubital area.  The Gore-Tex to  brachial vein anastomosis was dissected free.  The patient had undergone  thrombolysis 2 days earlier unsuccessfully with residual clot in the  vein.  Transverse opening was made in the graft.  The graft itself was  easily thrombectomized, although was not completely incorporated in the  tunnel on the venous half of the loop.  There was injury to the vein and  intimal hyperplasia extending up about 4-5 cm with necessitating  dissecting the vein up more proximally.  At that point, it was 5 mm in  size.  A new piece 6-mm graft anastomosed end-to-end to the old graft.  Fogarty would easily transverse the venous system into the central  veins, and upon return, no further thrombus retrieved.  Five dilator  would easily traverse the venous system.   Graft was spatulated and  anastomosed end-to-side with 6-0 Prolene.  Clamps were then released.  There was excellent pulse and thrill in the graft.  No protamine was  given.  No heparin was given.  Wound was irrigated with saline, closed  in layers with Vicryl in subcuticular fashion.  Sterile dressing  applied.  The patient was taken to recovery room in satisfactory  condition.      Nelda Severe Kellie Simmering, M.D.  Electronically Signed     JDL/MEDQ  D:  11/21/2007  T:  11/21/2007  Job:  [id number]

## 2010-10-22 NOTE — Op Note (Signed)
[name], [name]             ACCOUNT NO.:  [id number]   MEDICAL RECORD NO.:  [id number]          PATIENT TYPE:  AMB   LOCATION:  SDS                          FACILITY:  [facility]   PHYSICIAN:  Charles E. Fields, MD  DATE OF BIRTH:  1975/08/14   DATE OF PROCEDURE:  12/18/2008  DATE OF DISCHARGE:  12/18/2008                               OPERATIVE REPORT   PROCEDURE:  1. Ultrasound of neck  2. Insertion of right internal jugular vein palindrome catheter.   PREOPERATIVE DIAGNOSIS:  End-stage renal disease.   POSTOPERATIVE DIAGNOSIS:  End-stage renal disease.   ANESTHESIA:  Local with IV sedation.   ASSISTANT:  Nurse.   FINDINGS:  A 23-cm palindrome catheter, right internal jugular vein.   OPERATIVE DETAILS:  After obtaining informed consent, the patient was  taken to the operating room.  The patient was placed in the supine  position on the operating table.  After adequate sedation, the patient's  entire neck and chest were prepped and draped in the usual sterile  fashion.  Using ultrasound, the right internal jugular vein was  identified as had normal compressibility and respiratory variation.  Local anesthesia was infiltrated over the right internal jugular vein.  An introducer needle was used to cannulate the right internal jugular  vein.  Several successful cannulations of the vein were made.  However,  the guidewire would not pass centrally.  A central venogram was obtained  which showed extravasation of contrast in the subcutaneous tissues.  Therefore, the needle was removed and an additional cannulation of the  vein was performed.  At this point, the 0.035 J-tip guidewire did thread  into the central venous system without difficulty.  I was able to  advance the J-wire down below the diaphragm into the inferior vena cava.  Next, local anesthesia was infiltrated over the left chest wall.  An 11  blade was used to make a nick in the skin laterally.  The 23-cm  palindrome  catheter was then tunneled subcutaneously up to the level of  neck insertion site.  Sequential 12, 14, 16-French dilators with peel-  away sheath were placed over the guidewire down in the right atrium.  Guidewire and dilator were removed and the 23-cm catheter was placed  through the peel-away sheath into the right atrium.  Catheter tip was  checked under fluoroscopy and found to be in the right atrium, no kinks  throughout its course.  The catheter was sutured to skin with nylon  sutures.  The neck insertion site was closed with Vicryl stitch.  Catheter was noted to flush and draw easily.  Catheter was loaded with  concentrated heparin solution.  The patient tolerated the procedure well  and there were no complications.  Instrument, sponge, and needle counts  were correct at the end of the case.  The patient was taken to the  recovery room in stable condition.     Jessy Oto. Fields, MD  Electronically Signed    CEF/MEDQ  D:  12/18/2008  T:  12/19/2008  Job:  [id number]

## 2010-10-22 NOTE — H&P (Signed)
[name], [name]             ACCOUNT NO.:  [id number]   MEDICAL RECORD NO.:  [id number]          PATIENT TYPE:  INP   LOCATION:  6713                         FACILITY:  [facility]   PHYSICIAN:  Maudie Flakes. Hassell Done, M.D.   DATE OF BIRTH:  [birth year]   DATE OF ADMISSION:  02/06/2009  DATE OF DISCHARGE:                              HISTORY & PHYSICAL   HISTORY OF PRESENT ILLNESS:  The patient is a 35 year old black man with  past medical history as mentioned below.  He was directly admitted to  the [facility] for infected left IJ catheter.  The patient  reports that he started having fevers and chills since yesterday and  also some mild to moderate tenderness over the dialysis catheter site.  The patient went to the hemodialysis center today but did not get the  dialysis as the left IJ catheter site could not giv eadequate blood flow  and was thoughtr to be infected.  Two blood cultures were drawn and the  patient received 1.5 grams of vancomycin and 2 grams of ceftazidime in  the dialysis center and was directly admitted to renal service for  further management.  The patient denies any shortness of breath, cough,  chest pain, nausea, vomiting, diarrhea, abdominal pain.  Thperm cath was  removed and a temporary catheter was placed in the R femoral vein (in  IR) prior to his coming to the floor.   PAST MEDICAL HISTORY:  1. Hypertension.  2. End-stage renal disease on hemodialysis.  3. Obesity.  4. History of paroxysmal atrial fibrillation.  5. Iron deficiency anemia.  6. History of secondary hyperparathyroidism.  7. History of sepsis secondary to nonfunctional left AV graft.  8. History of MSSA from the catheter at left IJ.   FAMILY HISTORY:  Not significant.   SOCIAL HISTORY:  Denies tobacco.  The patient smokes marijuana.   ALLERGIES:  No known drug allergies.   MEDICATIONS:  1. Ativan 25 mg once a day.  2. Metoprolol 50 mg once a day.  3. Nephro-Vite tablets 1 tablet  once a day at bedtime.   REVIEW OF SYSTEMS:  Negative except for HPI.   PHYSICAL EXAMINATION:  VITAL SIGNS:  Temperature 98.4, blood pressure  114/70, pulse rate of 85, respiratory rate 19, oxygen saturation 98% on  room air.  GENERAL:  The patient is not in any acute distress.  The patient is  currently getting hemodialysis.  LUNGS:  Clear to auscultation bilaterally.  Fine bibasilar crackles on  the posterior aspect.  HEART:  S1 and S2.  Regular rate and rhythm.  No murmurs, no rubs, no  gallops.  ABDOMEN:  Soft, nondistended, nontender.  Bowel sounds positive.  EXTREMITIES:  No pedal edema.  NEUROLOGIC:  Nonfocal.   LABORATORY EXAMINATION:  CBC:  White count 13.6, hemoglobin 11.8,  platelet count 81.   IMPRESSION:  1. Infected left internal jugular catheter.  The patient's left      internal jugular catheter has been removed.  Cultures from the      catheter tip are pending.  Blood cultures from the hemodialysis  center are pending.  We will continue vancomycin and ceftazidime      until Gram stains return.  Given the history of methicillin-      sensitive staphylococcus aureus catheter infection earlier this      month, suspect methicillin-sensitive staphylococcus aureus catheter      infection.  The patient currently has temporary right femoral vein      catheter.  The patient had recurrent arteriovenous fistula attempts      in the past, and all of them were unsuccessful.  The patient      currently has no permanent vascular access.  2. Hypertension.  The patient's blood pressure is currently well-      controlled.  3. Hyperphosphatemia.  The patient's phosphorus from the dialysis      center is 9.7.  We will start him on Renvela.  We will follow up      the renal parameters.   PLAN OF ACTION:  Awaiting culture reports.  Continue vancomycin and  ceftazidime until we get the Gram stain reports.  Continue hemodialysis  through temporary right femoral catheter.  Plan  for permcath then  permanent hemodialysis access once infection clears.      Yolonda Kida, MD  Electronically Signed      Maudie Flakes. Hassell Done, M.D.  Electronically Signed    VB/MEDQ  D:  02/06/2009  T:  02/06/2009  Job:  [id number]

## 2010-10-22 NOTE — Op Note (Signed)
NAMEJAKEL, CROUCH             ACCOUNT NO.:  [id number]   MEDICAL RECORD NO.:  [id number]          PATIENT TYPE:  OUT   LOCATION:  XRAY                         FACILITY:  [facility]   PHYSICIAN:  Rosetta Posner, M.D.    DATE OF BIRTH:  24-Nov-1975   DATE OF PROCEDURE:  02/19/2008  DATE OF DISCHARGE:                               OPERATIVE REPORT   PREOPERATIVE DIAGNOSIS:  End-stage renal disease with occluded left  forearm loop arteriovenous Gore-Tex graft.   POSTOPERATIVE DIAGNOSIS:  End-stage renal disease with occluded left  forearm loop arteriovenous Gore-Tex graft.   PROCEDURE:  Thrombectomy and revision of left forearm loop arteriovenous  Gore-Tex graft with conversion from the brachial vein to the basilic  vein of his left arteriovenous Gore-Tex graft.   SURGEON:  Rosetta Posner, MD   ASSISTANT:  Nurse.   ANESTHESIA:  LMA.   COMPLICATIONS:  None.   DISPOSITION:  To recovery room stable.   PROCEDURE IN DETAIL:  The patient was taken to the operating room and  placed in the supine position.  The left arm was prepped and draped in  the usual sterile fashion.  Incision was made over the prior antecubital  incision and carried down to isolate the venous graft.  The patient had  had a venous extension from the brachial vein at the antecubital space  to several centimeters above this.  The graft was opened near the venous  anastomosis and there was tight stenosis at the venous anastomosis and  the vein felt to be sclerotic for several centimeters past this.  The  Gore-Tex graft itself was thrombectomized.  There was some adherent  filmy mural thrombus that was removed and the arterialized plug was  removed.  There was a 4 mm-anastomosis with a 4-7 taper graft.  This was  flushed and heparinized saline and reoccluded.  Next, the ultrasound was  used to visualize the basilic vein and the patient had a large caliber  basilic vein.  This was exposed through the same incision and  was  mobilized proximally and distally.  The old venous anastomosis was  ligated.  The graft was divided and brought into approximation of the  basilic vein.  Basilic vein was occluded proximally and distally and was  opened with an #11 blade and sewn with Potts scissors.  The graft was  spatulated and sewn end-to-side to the vein with running 6-0 Prolene  suture.  Clamps were removed and there still was somewhat poor flow.  The graft was reopened through the prior revision site and some  additional filmy mural thrombus was removed.  Next, the incision was  made to expose the arterial anastomosis.  The patient did have a good  caliber brachial artery that was occluded, was exposed proximally and  distally to the aortic arterial anastomosis.  The artery was occluded  proximally and distally and the graft was opened near the arterial  anastomosis.  No thrombus was noted.  A #4 dilator was passed easily  through the arterial anastomosis.  There was a fair pulse in the native  brachial artery, although it  was a good caliber.  Incision in the graft  was closed with a running 6-0 Prolene suture.  Clamps were removed and  good thrill was noted.  The wound was irrigated with saline.  Hemostasis was obtained with electrocautery.  The patient had been given  5000 units of intravenous venous heparin.  This was not reversed.  Wounds were closed with 3-0 Vicryl in the subcutaneous and subcuticular  tissue.  Benzoin and Steri-Strips were applied.      Rosetta Posner, M.D.  Electronically Signed     TFE/MEDQ  D:  02/19/2008  T:  02/20/2008  Job:  [id number]

## 2010-10-22 NOTE — Discharge Summary (Signed)
NAMEYOLANDA, [name]             ACCOUNT NO.:  [id number]   MEDICAL RECORD NO.:  [id number]          PATIENT TYPE:  INP   LOCATION:  6733                         FACILITY:  [facility]   PHYSICIAN:  Jessy Oto. Fields, MD  DATE OF BIRTH:  22-Jan-1976   DATE OF ADMISSION:  04/22/2008  DATE OF DISCHARGE:  04/26/2008                               DISCHARGE SUMMARY   ADMISSION DIAGNOSES:  1. Nonfunctioning infected left arm arteriovenous Gore-Tex graft.  2. Sepsis most likely related to nonfunctioning infected left arm      arteriovenous Gore-Tex graft.   FINAL DISCHARGE DIAGNOSES:  1. Nonfunctioning infected left arm arteriovenous Gore-Tex graft with      culture showing methicillin-resistant Staphylococcus aureus,      treated with intravenous vancomycin.  2. Sepsis, improved.  3. End-stage renal disease, on chronic hemodialysis (__________      dialysis center).   PAST MEDICAL HISTORY:  1. Hypertension.  2. Congestive heart failure in 2001.  3. Morbid obesity.  4. History of paroxysmal atrial fibrillation.  5. Iron deficiency anemia.  6. History of medical nonadherence.  7. Secondary hypoparathyroidism.  8. Occasional marijuana use in the past.   BRIEF HISTORY:  Mr. Markowitz is a 35 year old male, who on 04/22/2008  presented to the emergency department with fevers over the previous 24  hours, also was noted to have erythema and tenderness around the left  forearm graft, which was nonfunctioning.  The patient is on chronic  hemodialysis and is currently dialyzed via a Diatek catheter.  He was  evaluated by on-call vascular surgeon, Dr. Ruta Hinds and was  febrile with temperature of 102.3.  Heart rate was in the low 100s.  It  was felt that he was becoming septic from his nonfunctioning infected  left arm arteriovenous Gore-Tex graft.  I felt he should be admitted for  removal and IV vancomycin.   CONSULTANTS:  Nephrology.   HOSPITAL COURSE:  Mr. Henegar was admitted to the  hospital on April 22, 2008.  He was taken to the operating room and underwent removal of  his left forearm arteriovenous Gore-Tex graft with patch angioplasty of  his left brachial artery by Dr. Ruta Hinds.  He was placed on IV  vancomycin and Nephrology was consulted to manage his end-stage renal  disease.  Restraints were placed intraoperatively, and over the course  of several days, they were ultimately discontinued.  He was found to  have a 2+ radial pulse postoperatively.  On April 26, 2008, he was  felt appropriate to discharge home and restraints were out and he had  been afebrile.  Heart rate was also back to the 70s.  Blood pressure  124/83, oxygen saturation 98% on room air.  His labs as on April 25, 2008, showed phosphorus of 5.2, potassium 4.6, sodium 137, glucose of  81, BUN of 62, and creatinine of 14.7.  Total protein 5.9, blood albumin  2.8, and calcium 8.8.  Liver function tests were within normal limits  other than mildly decreased alkaline phosphatase at 34.  His white count  is 8.3, hemoglobin 10.8, hematocrit  31.2, and platelet count of 243.   DISPOSITION:  Mr. Accetta was discharged home on April 26, 2008, in a  stable and improved condition.   DISCHARGE MEDICATIONS:  1. Vancomycin 250 mg IV dialysis for a total of 2 weeks.  2. Enteric-coated aspirin 325 mg p.o. daily.  3. Os-Cal 500 mg 3 tablets t.i.d. with meals.  4. Metoprolol 50 mg 1 tablet p.o. b.i.d., hold before hemodialysis.  5. Dialyvite 1 p.o. daily.  6. Percocet 5/325 mg one to two tablets p.o. q.4-6 h. p.r.n. pain.   DISCHARGE INSTRUCTIONS:  Should continue renal-appropriate diet.  Should  have a dry dressing to his left arm with draining.  Otherwise, he can  wash his arm gently with soap and water and should call us if he  develops redness or increased pain in the arm.  Otherwise, Dr. Oneida Alar  will see him in approximately 1 week for suture removal and to discuss  plans for future  permanent hemodialysis access.      Jacinta Shoe, P.A.      Jessy Oto. Fields, MD  Electronically Signed    AWZ/MEDQ  D:  04/26/2008  T:  04/27/2008  Job:  [id number]   cc:   Jessy Oto. Fields, MD  [employer]

## 2010-10-22 NOTE — Op Note (Signed)
[name], [name]             ACCOUNT NO.:  [id number]   MEDICAL RECORD NO.:  [id number]          PATIENT TYPE:  INP   LOCATION:  6733                         FACILITY:  [facility]   PHYSICIAN:  Jessy Oto. Fields, MD  DATE OF BIRTH:  [birth year]   DATE OF PROCEDURE:  04/22/2008  DATE OF DISCHARGE:                               OPERATIVE REPORT   PROCEDURES:  1. Removal of left forearm arteriovenous graft.  2. Patch angioplasty, left brachial artery.   PREOPERATIVE DIAGNOSIS:  Infected left arm arteriovenous graft.   POSTOPERATIVE DIAGNOSIS:  Infected left arm arteriovenous graft.   ANESTHESIA:  General.   ASSISTANT:  Nurse.   FINDINGS:  1. Grossly infected left forearm arteriovenous graft.  2. Bovine pericardial patch.  3. Penrose drain.   SPECIMENS:  Culture of graft.   OPERATIVE DETAILS:  After obtaining informed consent, the patient was  taken to the operating room.  The patient was placed in supine position  on the operating table.  After induction of general anesthesia, the  patient's entire left upper extremity was prepped and draped in usual  sterile fashion.  A longitudinal incision was made through a preexisting  scar in the left arm just above the antecubital crease.  Incision was  carried down through subcutaneous tissues down to the level of the  graft.  There was a large amount of purulent material surrounding the  graft.  The graft was completely unincorporated.  Dissection was carried  down to level of the venous anastomosis.  Graft had been previously  thrombosed.  Venous anastomosis was dissected free circumferentially and  the vein was ligated proximal and distal to the anastomosis with a 3-0  silk tie.  The anastomosis was then completely taken down.  Attention  was then turned to the arterial limb of the graft.  A transverse  incision was made through a preexisting scar in the antecubital area.  Incision was carried down through the subcutaneous tissues  down to the  level of the graft.  Graft was dissected free circumferentially.  Dissection was carried down to the level of the brachial artery  anastomosis.  There was dense scar in this area, dissection was carried  down to the level of the brachial artery and the brachial artery was  dissected free circumferentially proximal and distal to the anastomosis.  The patient was then given 5000 units of intravenous heparin.  Vessel  loops were used to control brachial artery proximally and distally.  The  entire graft was removed all the way down to the brachial artery.  The  edges of the brachial artery were debrided away.  A bovine pericardial  patch was then brought up in the operative field and sewn on as patch  angioplasty using running 6-0 Prolene suture.  Just prior to completion  of anastomosis, it was fore bled, back bled, and thoroughly flushed.  Anastomosis was secured.  Vessel loops were released.  There was  pulsatile flow in the brachial artery immediately.  There was good  Doppler flow in the ulnar, radial, and brachial arteries at this point.  Hemostasis was obtained.  The remainder of the graft was then easily  removed and was poorly incorporated throughout its entire course.  Next,  a Penrose drain was brought out through a separate stab incision on the  ulnar aspect of the arm.  This was placed down through the tunnel where  the venous limb had been.  An additional Penrose drain was then placed  down the tunnel track were the arterial limb had been.  This was done  after irrigating the wounds with 2 L normal saline solution.  A running  3-0 Vicryl suture was used to close the subcutaneous tissues over the  brachial artery repair.  The skin was then closed with interrupted 3-0  nylon sutures.  The venous anastomosis site was also closed with a  running 3-0 Vicryl suture in the subcutaneous sutures and then loosely  closed with nylons and the skin.  The patient tolerated the  procedure  well and there were no complications.  Instrument, sponge, and needle  counts were correct at the end of the case.  The patient was taken to  recovery room in stable condition.  Cultures were sent from the graft  and the area of purulent collections.      Jessy Oto. Fields, MD  Electronically Signed     CEF/MEDQ  D:  04/22/2008  T:  04/22/2008  Job:  [id number]

## 2010-10-22 NOTE — Op Note (Signed)
[name], [name]             ACCOUNT NO.:  [id number]   MEDICAL RECORD NO.:  [id number]          PATIENT TYPE:  AMB   LOCATION:  SDS                          FACILITY:  [facility]   PHYSICIAN:  Nelda Severe. Kellie Simmering, M.D.  DATE OF BIRTH:  Aug 18, 1975   DATE OF PROCEDURE:  10/19/2006  DATE OF DISCHARGE:                               OPERATIVE REPORT   PREOPERATIVE DIAGNOSIS:  End-stage renal disease.   POSTOPERATIVE DIAGNOSIS:  End-stage renal disease.   OPERATION:  1. Bilateral ultrasound localization of internal jugular veins.  2. Insertion Diatek catheter via left internal jugular vein (28 cm).   SURGEON:  Nelda Severe. Kellie Simmering, M.D.   FIRST ASSISTANT:  Nurse.   ANESTHESIA:  Local.   PROCEDURE:  The patient was taken to the operating room and placed in  the supine position, at which time the upper chest and neck were  exposed.  Both internal jugular veins were then imaged using B-mode  ultrasound, both noted to be large, widely patent and normal in  appearance.  After prepping and draping in the routine sterile manner,  isolating the right side, which had recently been infected, the left  internal jugular vein was entered using a supraclavicular approach.  Guidewire passed into the right atrium under fluoroscopic guidance.  Guidewire was exchanged for an Amplatz Super Stiff wire.  After dilating  the tract appropriately, a 28 cm Diatek catheter was passed through a  peel-away sheath, positioned in the right atrium, tunneled peripherally  and secured with nylon sutures.  The wound was closed with Vicryl in a  subcuticular fashion.  Sterile dressing applied.  The patient was taken  to the recovery room in satisfactory condition.      Nelda Severe Kellie Simmering, M.D.  Electronically Signed     JDL/MEDQ  D:  10/19/2006  T:  10/19/2006  Job:  [id number]

## 2010-10-22 NOTE — Op Note (Signed)
[name], [name]             ACCOUNT NO.:  [id number]   MEDICAL RECORD NO.:  [id number]          PATIENT TYPE:  AMB   LOCATION:  SDS                          FACILITY:  [facility]   PHYSICIAN:  Judeth Cornfield. Scot Dock, M.D.DATE OF BIRTH:  20-Feb-1976   DATE OF PROCEDURE:  07/31/2007  DATE OF DISCHARGE:                               OPERATIVE REPORT   PREOPERATIVE DIAGNOSIS:  Chronic renal failure.   POSTOPERATIVE DIAGNOSIS:  Chronic renal failure.   PROCEDURE:  Ultrasound-guided placement of a right IJ Diatek catheter.   SURGEON:  Judeth Cornfield. Scot Dock, M.D.   ANESTHESIA:  Local with sedation.   TECHNIQUE:  The patient was taken to the operating room and sedated by  anesthesia.  The ultrasound scanner was used to mark both internal  jugular veins, both of which appeared to be patent.  The neck and upper  chest were then prepped and draped in the usual sterile fashion.  After  the skin was infiltrated with 1% lidocaine, the right IJ was cannulated.  A guidewire was introduced into the superior vena cava under  fluoroscopic control.  The tract over the wire was dilated, and then the  dilator and peel-away sheath were advanced over the wire, and the wire  and dilator were removed.  The catheter was passed through the peel-away  sheath and positioned in the right atrium.  The exit site for the  catheter was selected, and the skin anesthetized between the two areas.  The catheter was then brought through the tunnel, cut to the appropriate  length, and the distal ports were attached.  Both ports withdrew easily.  We then flushed with heparinized saline, and filled with concentrated  heparin.   The catheter was secured at its exit site with 3-0 nylon suture.  He IJ  cannulation site was closed with a 4-0 subcuticular stitch.  Sterile  dressing was applied.  The patient tolerated the procedure well, and was  transferred to the recovery room in satisfactory condition.  All needle  and  sponge counts were correct.      Judeth Cornfield. Scot Dock, M.D.  Electronically Signed     CSD/MEDQ  D:  07/31/2007  T:  08/01/2007  Job:  [id number]

## 2010-10-22 NOTE — Op Note (Signed)
[name], [name]             ACCOUNT NO.:  [id number]   MEDICAL RECORD NO.:  [id number]          PATIENT TYPE:  AMB   LOCATION:  SDS                          FACILITY:  [facility]   PHYSICIAN:  Nelda Severe. Kellie Simmering, M.D.  DATE OF BIRTH:  02/23/1976   DATE OF PROCEDURE:  09/03/2007  DATE OF DISCHARGE:  09/03/2007                               OPERATIVE REPORT   PREOPERATIVE DIAGNOSIS:  End-stage renal disease.   POSTOPERATIVE DIAGNOSIS:  End-stage renal disease.   OPERATION:  1. Insertion of left forearm arteriovenous Gore-Tex graft from      brachial artery to brachial vein.  2. Takedown of left upper arm arteriovenous fistula, thrombosed.   SURGEON:  Nelda Severe. Kellie Simmering, M.D.   FIRST ASSISTANT:  Chad Cordial, PA.   ANESTHESIA:  Local.   PROCEDURE:  The patient was taken to the operating room and placed in  supine position, at which time the left upper extremity was prepped with  Betadine scrub and solution and draped in routine sterile manner.  After  infiltration with 1% Xylocaine, a transverse incision was made in the  antecubital area through the previous scar where the upper arm fistula  had been created.  This fistulous was thrombosed and not salvageable.  This was dissected free.  Proximal and distal control of the artery was  obtained as well as an adjacent brachial vein, which was 5 mm in size.  The basilic vein was identified and was only 4 mm in size.  A loop-  shaped tunnel was then created after infiltration with 1% Xylocaine and  a 4 x 7-mm stretch Gore-Tex graft delivered through the tunnel.  The  artery was occluded proximally and distally.  The previous cephalic vein  anastomosis was transected and the vein completely excised from the  brachial artery.  The 4-mm end of the graft was excised at the 5 mm  level, spatulating it and anastomosing it end-to-side with 6-0 Prolene.  The 7-mm of the graft was then anastomosed end-to-side to the brachial  vein with 6-0  Prolene, clamps released, and there was a good pulse and  thrill in the graft.  No heparin or protamine was given.  The wounds  were irrigated with saline, closed in layers with Vicryl in subcuticular  fashion, a sterile dressing applied.  The patient taken to the recovery  room in satisfactory condition.      Nelda Severe Kellie Simmering, M.D.  Electronically Signed     JDL/MEDQ  D:  09/03/2007  T:  09/04/2007  Job:  [id number]

## 2010-10-25 NOTE — Consult Note (Signed)
[place]  Patient:    IZAIH, JAGGARD                      MRN: [id number] Proc. Date: 05/07/00 Adm. Date:  [id number] Attending:  Doyce Para CC:         Wenda Low, M.D.   Consultation Report  REFERRING PHYSICIAN:  Wenda Low, M.D.  CHIEF COMPLAINT:  Shortness of breath.  HISTORY OF PRESENT ILLNESS:  This is a 35 year old black male with untreated hypertension, who presented with increasing shortness of breath and severe hypertension.  The patient has a history of increased dyspnea over one month. He also has had some episodes of vague chest pain occurring when he coughs. He also has had a couple episodes of chest pain which are sharp and fleeting pains across his chest, lasting several seconds and then resolving.  He has had a history of cough and bronchitis about a month or a month and a half ago. This seems to also be when his shortness of breath started.  He does have a history of obesity, hypoventilation syndrome and underwent V/Q scanning recently, which was low probability.  Lower extremity Dopplers were negative, and PFTs were consistent with restrictive changes from his obesity.  He was admitted and had 2-D echocardiogram that showed moderate dilated left ventricular with ejection fraction of 25-35% with diffuse hypokinesis, mild mitral regurgitation and moderate left atrial enlargement.  He ruled out for myocardial infarction by serial enzymes.  He was started on ACE inhibitors. His Normodyne was decreased secondary to his low ejection fraction.  Lasix was added.  His ECG shows normal sinus rhythm with no ST-T wave abnormalities.  PAST MEDICAL HISTORY:  Significant for morbid obesity.  PAST SURGICAL HISTORY:  Left knee surgery for an ACL repair many years ago from a sports injury.  ALLERGIES:  No known drug allergies.  MEDICATIONS:  None on admission.  FAMILY HISTORY:  His father and mother live in [place], [place]. No history of diabetes or stroke in the family.  Mother had some blood disorder she is not aware of.  SOCIAL HISTORY:  He is single and smokes marijuana, one cigarette a day.  He also has alcohol abuse and drinks heavily on the weekends with his roommate. He described drinking three cases of beer plus hard liquor.  He has no children.  REVIEW OF SYSTEMS:  As per HPI, otherwise negative.  PHYSICAL EXAMINATION:  VITAL SIGNS:  On physical examination his blood pressure is 148/90, heart rate 70-90.  GENERAL:  A well-developed, well-nourished, obese black male in no acute distress.  HEENT:  Extraocular muscles are intact bilaterally.  Oral mucosa is pink and moist.  NECK:  Supple without lymphadenopathy.  No masses.  LUNGS:  Clear to auscultation bilaterally.  HEART:  Regular rate and rhythm.  No murmurs, rubs or gallops, S1, S2.  ABDOMEN:  Soft, nontender, nondistended with active bowel sounds.  No hepatosplenomegaly.  EXTREMITIES:  No edema.  LABORATORY DATA:  Potassium 5.1.  LFTs within normal limits.  CPK 146 and 134 with MBs 2.5 and 1.6.  Troponin 0.05.  ASSESSMENT AND PLAN: 1. Dilated cardiomyopathy, probably secondary to uncontrolled hypertension.    He does have a history of weekend alcohol abuse so there may be a    contributing factor from alcoholism.  He also smokes marijuana everyday.    He had a recent upper respiratory infection approximately one and a half    months  ago, which also raised the question of a viral etiology.  ECG    is within normal limits, therefore unlikely to have a underlying coronary    disease and his chest pain episodes are very atypical for angina.  Agree    with ACE inhibitors, diuretics, low-dose beta blockers, aspirin.  Would    consider adding Lanoxin. 2. Paroxysmal atrial fibrillation with apparently one run noted with    right bundle-branch aberrancy.  Given his underlying dilated cardiomyopathy    and his paroxysmal  atrial fibrillation it puts him at an increased risk    of cardiac embolic events and would recommended probably anticoagulating    him with Coumadin.  Also, would add Lanoxin to prevent further episodes. DD:  05/07/00 TD:  05/07/00 Job: [id number]

## 2010-10-25 NOTE — Op Note (Signed)
[name], [name]             ACCOUNT NO.:  [id number]   MEDICAL RECORD NO.:  [id number]          PATIENT TYPE:  AMB   LOCATION:  SDS                          FACILITY:  [facility]   PHYSICIAN:  Jessy Oto. Fields, MD  DATE OF BIRTH:  Mar 02, 1976   DATE OF PROCEDURE:  06/24/2006  DATE OF DISCHARGE:  06/24/2006                               OPERATIVE REPORT   PROCEDURE:  Left brachial cephalic arteriovenous fistula.   PREOPERATIVE DIAGNOSIS:  End stage renal disease.   POSTOPERATIVE DIAGNOSIS:  End stage renal disease.   ANESTHESIA:  General.   SURGEON:  Charles E. Fields, M.D.   ASSISTANT:  Suzzanne Cloud, P.A.-C.   OPERATIVE FINDINGS:  3 mm left cephalic vein.   OPERATIVE DETAILS:  After obtaining informed consent, the patient was  taken to the operating room.  The patient was placed in the supine  position on the operating table.  After adequate sedation, the patient's  entire left upper extremity was prepped and draped in the usual sterile  fashion.  The patient became very agitated at this point and general  anesthetic was commenced.  Next, the left arm was prepped and draped in  the usual sterile fashion.  Local anesthesia was infiltrated near the  antecubital crease.  A transverse incision was made in this location and  carried down through the subcutaneous tissues down to the level of the  cephalic vein.  This was approximately 3 mm in diameter.  Small side  branches were ligated and divided between silk ties.  The brachial  artery was then dissected free in the medial portion of the incision.  It was controlled proximally and distally with vessel loops.  The  patient was also given 5000 units of intravenous heparin.  A  longitudinal arteriotomy was then made in the brachial artery.  The  distal cephalic vein was ligated with a 2-0 silk tie, transected, and  swung over the level of the artery.  This was gently flushed with  heparinized saline and clamped with a fine  bulldog clamp.  It was then  sewn end of vein to side of artery using a running 7-0 Prolene suture.  Just prior completion of the anastomosis, it was forward bled, back  bled, and thoroughly flushed.  The anastomosis was secured, clamps were  released. There was some filling of the fistula but the thrill was poor.  At this point, the vessel loops were pulled taut again, the fistula was  taken back down and inspected.  There was no evidence of intimal flap or  thrombus within the vein or the artery.  A new 7-0 Prolene was brought  to the operative field.  This was then sewn end of vein to side of  artery in a similar fashion.  Just prior to completion of the  anastomosis, everything was forward bled, back bled, and thoroughly  flushed.  The anastomosis was secured, vessel loops were released, and  there was now a weakly palpable thrill in the fistula.  The fistula also  had a more full appearance.  Next, hemostasis was obtained.  The  subcutaneous  tissues were reapproximated using running 3-0 Vicryl  suture.  The skin was closed with a 4-0 Vicryl subcuticular stitch.  The  patient tolerated the procedure well and there were no complications.  The instrument, sponge, and needle counts were correct at the end of the  case.  The patient was taken to the recovery room in stable condition.  The patient had a palpable radial pulse at the end of the case.      Jessy Oto. Fields, MD  Electronically Signed     CEF/MEDQ  D:  06/24/2006  T:  06/24/2006  Job:  [id number]

## 2010-10-25 NOTE — Assessment & Plan Note (Signed)
[facility] OFFICE NOTE   [name], [name]                      MRN:          [id number]  DATE:08/31/2006                            DOB:          [birth year]    [name] is a 35 year old African American male who is now undergoing  dialysis 3 times a week for 4 hours at [facility]. He was  hospitalized January 7 to June 20, 2006 with renal failure. He  presented with shortness of breath and edema.   He was also hospitalized in 2001 with congestive heart failure.   PAST MEDICAL HISTORY:  Includes arthroscopic surgery on the left.   He originally had kidney failure in 2006. He states that he had 45%  function left at that time. There was a possibility that it may have  been related to nonsteroidals, specifically Celebrex.   He denies diabetes but has had hypertension.   FAMILY HISTORY:  Negative for cancer, diabetes, heart attack. Maternal  grandmother had hypertension and strokes times 2. Paternal uncle had  stroke, paternal aunt had hypertension.   He smokes and occasional cigar; he rarely drinks beer. Since dialysis he  has been somewhat inactive. 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   Presently he is on a renal diet with low sodium.   He is presently on;  1. Dialyvite daily.  2. Metoprolol 50 mg twice a day.  3. Amlodipine 10 mg at bedtime.  4. Calcium supplementation.   He previously was on lisinopril and Catapres, Toprol and Demodex but he  had been of the medications 4 to 5 months prior to developing kidney  failure in January of this year.   REVIEW OF SYSTEMS:  Completed in toto. He has no active symptomatology.   Specifically he has no chest pain or palpitations, or shortness of  breath at this time.   He has no active musculoskeletal, no GI, or GU symptoms.   He is 6 feet 1inch, weighs 315, pulse is 60 and regular,  respiratory  rate 15, and blood pressure 134/92. He has a beard and a mustache. Otic  canals and nares are patent. There is a single carious R maxillary molar  which  appears to be malaligned . He has no lymphadenopathy of head,  neck or axilla.  Thyroid normal to palpation.   CHEST: Clear to auscultation. He has an S4 with no significant murmurs.   Striae are present over the abdomen. A Port-A-Cath is present over the  right chest.   He has full range of motion of his extremities. There are no significant  Musculoskeletal findings.   He has no edema. Pedal pulses are intact.   GENITOURINARY/RECTAL EXAMS: Are initially deferred as these were  completed during his hospitalization.   An EKG was done, so far no charge will be submitted as he had an EKG in  hospital. This reveals minor interventricular conduction delay, there is  no ischemic change.   I will request copies of his hospital records and copies of the labs  performed at dialysis. If fasting lipids and A1C have not been done then  I would recommend these be preformed because the possibility of  metabolic syndrome clinically.   To facilitate continuity of care, a copy of this will be sent to Dr.  Salem Senate who has served as his nephrologist.     Darrick Penna. Linna Darner, MD,FACP,FCCP  Electronically Signed    [facility]/MedQ  DD: 08/31/2006  DT: 08/31/2006  Job #: [id number]   cc:   Maudie Flakes. Hassell Done, M.D.

## 2010-10-25 NOTE — Op Note (Signed)
NAMEMARGARITA, JAVED NO.:  [id number]   MEDICAL RECORD NO.:  [id number]                   PATIENT TYPE:  OBV   LOCATION:  1828                                 FACILITY:  [facility]   PHYSICIAN:  Jessy Oto, M.D.                DATE OF BIRTH:  [birth year]   DATE OF PROCEDURE:  03/05/2003  DATE OF DISCHARGE:                                 OPERATIVE REPORT   PREOPERATIVE DIAGNOSIS:  Left posterior elbow dislocation.   POSTOPERATIVE DIAGNOSIS:  Left posterior elbow dislocation.   PROCEDURES:  Closed reduction of left posterior elbow dislocation under  general anesthetic.   ESTIMATED BLOOD LOSS:  0 mL.   DRAINS:  None.   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  He apparently fell backwards, landing  on his left arm and sustaining a left elbow injury.  Attempt at reduction by  trainer on the sidelines unsuccessful.  The patient drove from [place],  [place], to [place] to be seen tonight.  Emergency room attempts  at manipulation and reduction using IV conscious sedation 10 mg of both  Versed and morphine unsuccessful in obtaining adequate relaxation to allow  for reduction.  The patient was brought to the operating room.  He has a  history of previous CHF and hypertension.   DESCRIPTION OF PROCEDURE:  After adequate general anesthesia using face mask  anesthesia, an attempt at manipulation of the left elbow was performed using  longitudinal traction across the elbow and then direct force over the tip of  the olecranon process, attempting to force it distally over the distal  humeral condyles.  This appeared unsuccessful.  The C-arm fluoroscopy was  used to carefully ascertain that the position and alignment of the elbow  remain dislocated, but perched ready to be relocated.  With the then  further  longitudinal traction with the elbow in about 30 degrees of flexion was  successful in obtaining reduction.  The elbow was then placed through a  range of motion.  It was able to be flexed nearly 130 degrees and extended  about 20 degrees short of full extension.  There was laxity both medial and  lateral noted.   C-arm microradiographs were obtained which will be used for permanent  documentation purposes.  These demonstrated a concentric reduction of the  left elbow joint.  A small coracoid process fracture was noted.  No other  significant abnormalities.  The patient was then placed into a left long-arm  posterior splint using plaster of Paris material and two 4-inche ACE wraps.  Reactivated, extubated, and returned to the recovery room in satisfactory  condition.  All instrument and sponge counts were correct.  Jessy Oto, M.D.    Liz Malady  D:  03/05/2003  T:  03/06/2003  Job:  [id number]

## 2010-10-25 NOTE — Discharge Summary (Signed)
[name], [name]                         ACCOUNT NO.:  [id number]   MEDICAL RECORD NO.:  [id number]                   PATIENT TYPE:  INP   LOCATION:  5511                                 FACILITY:  [facility]   PHYSICIAN:  Maudie Flakes. Hassell Done, M.D.                DATE OF BIRTH:  Dec 17, 1975   DATE OF ADMISSION:  09/25/2003  DATE OF DISCHARGE:  09/27/2003                                 DISCHARGE SUMMARY   DISCHARGE DIAGNOSES:  1. End-stage renal disease thought to be secondary to chronic uncontrolled     hypertension, but investigation still pending.  2. Chronic uncontrolled hypertension.  3. Obesity.   DISCHARGE MEDICATIONS:  1. Metoprolol 50 mg p.o. b.i.d.  2. Lasix 40 mg p.o. b.i.d.  3. Clonidine 0.1 mg p.o. b.i.d.   PROCEDURES:  1. Renal ultrasound September 26, 2003, showed normal size kidneys right and     left averaging about 11 cm.  No focal renal mass or hydronephrosis.  No     comment on echogenicity.  2. Chest x-ray done on September 26, 2003, showed mild cardiomegaly, no evidence     of pleural effusion, lungs clear, no evidence of mass or adenopathy, no     active disease.   CONSULTATIONS:  None.   CONDITION ON DISCHARGE:  Improved.   ADMISSION HISTORY AND PHYSICAL:  Please see the chart for full details, but  in brief, [name] is a 35 year old African-American male here  because he was seen at [facility] for nausea and vomiting x 3 days and  decreased energy for two weeks.  He was noted to have a blood pressure of  170/120, creatinine 4.  He was given Lisinopril 20 mg p.o. daily an  Tenoretic 50/25, and he returned the following day with creatinine 4.6.  Therefore, an appointment was made to see Dr. Hassell Done in his office on the day  of admission, September 25, 2003.  UA showed on April 15, 0 to 2 white blood  cells, 5 to 20 red blood cells, granular casts.  It was noted that he was  hospitalized in 2001 for partially dilated cardiomyopathy, questionable  relationship to  alcohol.  Creatinine then was 1.6 in November 2001, 1.3 in  June 2003 in Dr. Theodosia Blender office, and in September 2004, ISTAT showed a BUN  of 15, but creatinine was not done.  Therefore, he was admitted for  evaluation and workup of acute increase in creatinine.   PHYSICAL EXAMINATION:  VITAL SIGNS: Temperature 99, pulse 76, respiratory  rate 26, blood pressure 176/84, O2 saturation 98% on room air.  GENERAL:  Obese, pleasant, black male, resting comfortable.  CARDIOVASCULAR:  Regular rate and rhythm.  ABDOMEN:  Positive bowel sounds, soft, obese, nontender, nondistended.  RESPIRATORY:  Clear to auscultation bilateral anterior lung fields.   ADMISSION LABORATORY AND X-RAY DATA:  Sodium 142, potassium 4.3, chloride  108, CO2 of 27, BUN 44,  creatinine 4.5, glucose 108, calcium 9.2, phosphorus  4.3.  CBC shows white blood cell count of 6.9, hemoglobin 13.6, platelet  count 183.  A 24-hour protein showed 2100 g for 24 hours.  Creatinine showed  2690 g for 24 hours.   Chest x-ray showed above.   HOSPITAL COURSE:  #1.  INCREASED CREATININE:  The patient was admitted.  A renal ultrasound  was done.  HIV was obtained which was nonreactive.  Sickle test was negative  as well.  Further workup of this problem, SPEP and UPEP were done.  An ANA  was done also.  C3 and C4 complement levels were ordered.  Those labs are  still pending.  It is thought that, with the acute rise in creatinine, the  patient may benefit from a renal biopsy to help investigate etiology;  however, the lead diagnosis at this time is chronic uncontrolled  hypertension.  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  The patient will follow up with Dr. Lorrene Reid in her  office in two weeks on May 4 at which time they will discuss proceeding with  a  renal biopsy.  At that time also, Dr. Lorrene Reid will follow up on the labs  that were ordered to see if there is any possible other cause for the  patient's renal failure.   #2.  HYPERTENSION:  Difficult to control the patient's blood pressure during  hospitalization. The patient was started on metoprolol with minimal  significant results.  Therefore, Lasix and Clonidine were added on day of  discharge.  At the time fo discharge, the patient's blood pressure had come  down to goal systolic being closer to [id number], and at the time of discharge, he  was 170/103 as the levels of his blood pressure medicines attained steady  state.  His blood pressure will be better controlled with a multiple  medication therapy.  The patient will be followed up in two weeks at Dr.  Sanda Klein office at [facility] for which he can be reevaluated for his  blood pressure and changes in medication can be made based on those results.  The patient was urged in the interval to check his blood pressure if he does  feel dizzy, to make sure that we are not over treating his blood pressure,  and blood pressure may be lower outside of the hospital.   #3.  NAUSEA AND VOMITING:  Etiology of this is unknown; however, it is  resolved at the time of discharge.  It was though this could be secondary to  some azotemia from his increased creatinine.   DISCHARGE LABORATORY AND X-RAY DATA:  White blood cell count 7.1, hemoglobin  14, creatinine 41.6, platelet count 191.  Sodium 136, potassium 4.3,  chloride 101, CO2 28, glucose 90, BUN 34, creatinine 4.2, albumin 3.4,  calcium 9.1, phosphorus 4.4.   FOLLOW UP:  The patient was to follow up with Dr. Lorrene Reid on May 4 at 10:30  a.m.  The patient has an appointment with [facility] on May 10 at 3 p.m. for  evaluation of hemoglobin access.  At the time of patient's followup with Dr.  Lorrene Reid, it is recommended that he be followed for his multiple labs to discover etiology of his kidney failure  and discussed if patient would  benefit from a renal biopsy.  Check the blood pressure and check his  electrolytes to see what affect the antihypertensives have had on his blood  pressure.      Della Goo, MD                         Maudie Flakes. Hassell Done, M.D.    [facility]/MEDQ  D:  09/27/2003  T:  09/29/2003  Job:  [id number]   cc:   [facility]   [facility]

## 2010-10-25 NOTE — Op Note (Signed)
[name], [name]             ACCOUNT NO.:  [id number]   MEDICAL RECORD NO.:  [id number]          PATIENT TYPE:  INP   LOCATION:  3302                         FACILITY:  [facility]   PHYSICIAN:  Jessy Oto. Fields, MD  DATE OF BIRTH:  Dec 05, 1975   DATE OF PROCEDURE:  06/16/2006  DATE OF DISCHARGE:                               OPERATIVE REPORT   PROCEDURE:  1. Ultrasound neck  2. Insertion of right internal jugular vein Diatek catheter.   PREOPERATIVE DIAGNOSIS:  End-stage renal disease.   POSTOPERATIVE DIAGNOSIS:  End-stage renal disease.   ANESTHESIA:  Local with IV sedation.   ASSISTANT:  Nurse.   OPERATIVE FINDINGS:  1. 23 cm Diatek catheter right internal jugular vein.   OPERATIVE DETAILS:  After obtaining informed consent, the patient taken  to the operating room.  The patient placed supine position operating  table.  After adequate sedation, the patient's right neck was inspected  with an ultrasound and the right internal jugular vein was found be  widely patent with normal compressibility and respiratory variation.  Next the patient's entire neck and chest prepped and draped usual  sterile fashion.  Local anesthesia was infiltrated over the right  internal jugular vein.  Small finder needle was used to localize right  internal jugular vein.  A larger introducer needle was used to cannulate  the right internal jugular vein and a 0.035 J-tip guidewire was threaded  through the right internal jugular vein down into the inferior vena cava  under fluoroscopic guidance.  Next, sequential 12, 14, 16-French  dilators placed over the guidewire into the right atrium.  Guidewire  dilator was removed and 23 cm Diatek catheter placed through the peel-  away sheath in the right atrium.  The peel-away sheath was then removed.  Catheter was inspected under fluoroscopy and found with its tip to be in  the right atrium.  The catheter was tunneled subcutaneously, cut to  length and hub  attached.  Catheter was noted to flush and draw easily.  Catheter was then inspected under fluoroscopy and found with its tip to  be in the right atrium, no kinks throughout its course.  Catheter was  then sutured to skin with nylon sutures.  Neck insertion site was closed  with Vicryl stitch.  Catheter was then loaded with concentrated heparin  solution.  The patient tolerated procedure well and there were no  complications.  Instrument, sponge, needle counts correct end the case.  The patient taken to recovery room in stable condition.      Jessy Oto. Fields, MD  Electronically Signed    CE[facility]/MEDQ  D:  06/16/2006  T:  06/16/2006  Job:  [id number]

## 2010-10-25 NOTE — Discharge Summary (Signed)
[facility]  Patient:    [name], [name]                      MRN: [id number] Adm. Date:  [id number] Disc. Date: [id number] Attending:  Doyce Para Dictator:   Doyce Para, M.D. CC:         Wenda Low, M.D.  Fransico Him, M.D.  Christena Deem. Melvyn Novas, M.D. [facility]   Discharge Summary  DISCHARGE DIAGNOSES: 1. Severe uncontrolled hypertension. 2. Atypical chest x-ray, resolved. 3. Congestive heart failure.    a. Ejection fraction 30%. 4. Paroxysmal atrial fibrillation.    a. In normal sinus rhythm upon discharge. 5. Morbid obesity.    a. Probable obesity hypoventilation.       1. Oxygen desaturation to 74% at night. 6. Question obstructive sleep apnea.    a. Sleep study as an outpatient. 7. Status post right knee surgery after ACL tear.  DISCHARGE MEDICATIONS: 1. Coumadin 5 mg q.d. 2. Digoxin 0.25 mg q.d. 3. Lotensin 40 mg q.d. 4. Lasix 40 mg q.d. 5. Labetolol 100 mg p.o. b.i.d. 6. Clonidine 0.1 mg p.o. b.i.d. 7. Tylenol as needed for pain. 8. Oxygen 2 liters via nasal cannula at night while sleeping.  CONDITION ON DISCHARGE:  Stable.  Pain-free.  Oxygen saturations 98% on room air while awake and upright.  ACTIVITY:  No strenuous exercise, but okay to return to work without restrictions.  Cleared through cardiology prior to discharge.  DIET:  Low salt, low fat.  SPECIAL INSTRUCTIONS:  No alcoholic beverages, no tobacco.  No drugs.  FOLLOW-UP: 1. Coumadin Clinic Tuesday, December 4, at 12:30 p.m.  Dawn will see and    draw a pro-time and adjust Coumadin as needed. 2. Monday, December 10, at 1:30 p.m. to see Fransico Him, M.D.  Basic    metabolic panel will be checked at the above appointment, but any    follow-up on blood pressure and concerning this can be discussed. 3. Thursday, December 20, at 10:30 a.m. the patient will see Lamar Sprinkles    at the Congestive heart failure Clinic at the Cardiology office. 4. The patient is to contact  [place]. Melvyn Novas, M.D. [facility], pulmonologist, to    schedule an appointment in two to three weeks. 5. The patient is to contact Wenda Low, M.D. who will be his new    primary care physician to schedule an appointment in approximately two to    three weeks. IN FOLLOW-UP NEEDS TSH CHECKED (DID NOT GET DONE AS ADDED    IN HOSPITAL).  CONSULTING PHYSICIANS: 1. Legrand Como B. Melvyn Novas, M.D. [facility] 2. Fransico Him, M.D.  PROCEDURE: 1. Chest x-ray (November 27): cardiomegaly with asymmetric pulmonary edema,    right greater than left. 2. EKG: normal sinus rhythm.  No significant abnormalities. 3. Lower extremity venous Dopplers: No evidence of DVT, SVT, or Bakers cyst. 4. Two-dimensional echocardiogram (November 28): LV moderately dilated.    Systolic function moderately to markedly decreased with an estimated    ejection fraction of 25 to 35%.  Severe diffuse LV hypokinesis.  Left    atrium moderately dilated. 5. Pulmonary function test.  Moderate restrictive disease.  Diffusion capacity    when corrected for alveolar volume (the patients size). 6. V/Q scan: low probability.  HOSPITAL COURSE:  #1 - Congestive heart failure.  Mr. Delaney is a 35 year old, morbidly obese, African-American gentleman who presents with progressive dyspnea on exertion and a blood pressure of 210/138.  He had been told  he had high blood pressure in the past, but never followed up with a physician.  Initial x-ray does reveal some mild edema.  He is initially placed on Normodyne to control his blood pressure.  Because of some atypical chest pain he had, there were concerns about pulmonary embolus.  V/Q scan was normal to low probability (reviewed with two radiologists) with lower extremity Dopplers.  My suspicion was overall low for this and we proceeded with two-dimensional echocardiogram which did demonstrate a cardiomyopathy with an ejection fraction of 25 to 35%. Fransico Him, M.D., cardiologist, saw the patient with  me and felt that this was most likely related to his severe hypertension.  He received minimal diuresis and his symptoms improved.  Mr. Nankervis does have a history of tobacco as well as alcohol use and we counseled him on the importance of cessation.  He is a very young man to be having such significant problems.  At this point, he will be maintained on a diuretic and an ACE inhibitor with close follow-up by cardiology.  #2 - Paroxysmal atrial fibrillation.  Mr. Yerk had an episode of atrial fibrillation in the hospital.  For this reason, we did not anticoagulate him and put him on Digoxin.  Dr. Radford Pax will follow this up.  #3 - Obesity hypoventilation.  Mr. Ciocca had oxygen saturations go as low as 74% overnight on continuous pulse oximetry.  We did order him home oxygen for use at night.  He will follow with Dr. Melvyn Novas and likely needs a sleep study scheduled in the future.  #4 - Uncontrolled hypertension.  Mr. Lenning was placed on several medications.  We did opt to decrease his Normodyne after we found out his ejection fraction.  At this point, he will be on an ACE inhibitor, Lasix, Normodyne, and Clonidine.  I stressed the importance of compliance to Mr. Cloward and the danger if this progresses.  If his blood pressure is not controlled on maximum doses of three to four medications, I would consider working him up for renal artery stenosis or other possible secondary causes, but at this point he has not failed medication.  His discharge blood pressure was 123456 which is certainly not ideal, but acceptable in this gentleman who had been running in the 200/140 range.  ADDENDUM:  Mr. Pinson did not have insurance during this hospital stay.  He was starting with [employer] and his insurance was to become effective in  approximately five to seven days.  He will notify his physicians and follow-up of his current insurance status at the time of visits.  DISCHARGE LABORATORY DATA:   Sodium 138, potassium 4.2, chloride 102, bicarb 29, BUN 16, creatinine 1.6, glucose 98, INR 1.3.  Hemoglobin 13.9, MCV 79, WBC 9200, platelet count 317.  Urinalysis concentrated with a specific gravity of 1.020 and a protein of 100. Otherwise negative. DD:  05/12/00 TD:  05/12/00 Job: [id number]

## 2010-10-25 NOTE — H&P (Signed)
[name], [name]             ACCOUNT NO.:  [id number]   MEDICAL RECORD NO.:  [id number]          PATIENT TYPE:  INP   LOCATION:  3302                         FACILITY:  [facility]   PHYSICIAN:  Darrold Span. Florene Glen, M.D.  DATE OF BIRTH:  [birth year]   DATE OF ADMISSION:  06/15/2006  DATE OF DISCHARGE:                              HISTORY & PHYSICAL   CHIEF COMPLAINT:  Sore throat, blurred vision left eye, shortness of  breath for two weeks.   HISTORY OF PRESENT ILLNESS:  [name] is a 35 year old African  American male with a past medical history significant for chronic kidney  disease, malignant, uncontrolled hypertension, obesity, and medical  noncompliance.  He was transferred to the emergency room today from  [place] secondary to severe hypertension with systolic blood pressure  [id number].  The patient states that he was seen at [facility] today  secondary to worsening sore throat, shortness of breath, and blurred  vision to the left eye.  The patient states he has had a sore throat for  several weeks but has not had a strep testing or antibiotic treatment.  He denies fever, chills, or night sweats.  He complained of a headache  since nitroglycerin drip was started today but otherwise no previous  headache.  He has had no cough, hemoptysis, otorrhea, or tinnitus.  He  states he has mild congestion.  Blurred vision with rapid onset and no  improvement over the past two weeks.  No discharge, diplopia, blindness  to the eye and no pain.  The patient reports spots in vision in left eye  two years ago with no problem since.  He states he has had shortness of  breath worsening over the past two weeks with increased orthopnea and  PND.  He denies chest pain, palpitations, leg swelling.  He states that  he cannot determine any recent changes in weight.  The patient has  history of noncompliance with MD appointments and medications due to  cost.  He has not seen a physician or taken  medicines in approximately  six months.   ALLERGIES:  None known.   MEDICATIONS:  None.   PAST MEDICAL HISTORY:  1. Hypertension approximately 15 years.  2. Congestive heart failure in 2001.  3. Chronic kidney disease since 2005 with primary nephrologist, Dr.      Jeneen Rinks Deterding.  4. Obesity.  5. History of atrial fibrillation, now in sinus rhythm.  No Coumadin      therapy.  6. Secondary hyperparathyroidism.  7. Anemia of chronic disease.   SOCIAL HISTORY:  The patient works at [employer] at [name].  He has been  able to continue working over the past few weeks.  He denies alcohol or  cigarette use.  He smokes cigars occasionally and admits to marijuana  use, approximately one per day.  He denies other drug use.   FAMILY HISTORY:  Mother is 64 years old with no significant past medical  history.  Father is 2 years old and healthy.  There is no history of  diabetes that the patient is aware of.  He  states his father has kidney  disease but he does not know to what extent.   REVIEW OF SYSTEMS:  Please see HPI.  Otherwise abdominal positive nausea  with occasional vomiting and loose, watery stool for one week.  No  hematemesis or bloody diarrhea.  No abdominal pain or cramping.  GU:  Good urine output. No dysuria or hematuria.  MUSCULOSKELETAL:  Leg  cramping with ambulation.  No joint pain or swelling.  NEUROLOGIC:  No  numbness or tingling in hands or feet.   PHYSICAL EXAMINATION:  VITAL SIGNS:  Temperature 99.2, heart rate 120,  respirations 16.  Blood pressure on arrival 226/147.  Current pressure  is 199/122. Oxygen saturation 95-98% on room air.  GENERAL:  The patient is alert and oriented x3.  No acute disease.  HEENT:  Head is atraumatic, normocephalic.  Visual acuity 20/25 OS plus  OD.  PERRLA.  EOMI.  No nystagmus.  Tympanic membranes clear.  No  erythema.  NECK:  Supple.  Soft.  No lymphadenopathy.  HEART:  Regular rate and rhythm.  Tachycardic.  S1 and S2 present.   No  murmur.  LUNGS:  Mild basilar inspiratory crackles.  Decreased breath sounds  diffusely.  ABDOMEN:  Soft, nontender.  Positive bowel sounds.  EXTREMITIES:  Trace right lower extremity edema.  1+ left lower  extremity edema.  CARDIOVASCULAR:  No JVD.  2+ radial pulses bilaterally.  1+ posterior  tibialis pulses bilaterally.  SKIN:  Warm and dry with multiple skin tags present.   ADMISSION LABORATORY DATA:  WBC 10.4, hemoglobin 10.7, hematocrit 31.9,  platelets 271,000.  Sodium 132, potassium 6.6, BUN 133, creatinine 17.2,  glucose 97.  Admission chest x-ray shows cardiomegaly with pulmonary  congestion and diffuse bibasilar alveolar edema or pneumonia.   ASSESSMENT/PLAN:  1. Malignant hypertension.  The patient will be admitted to the ICU      given his malignant hypertension.  We will discontinue his      nitroglycerin drip in the ER and change to p.o. medications with      p.r.n. labetalol push every 15 minutes p.r.n.  He will be initiated      on Norvasc, clonidine, Lopressor, and lisinopril.  2. Kidney disease secondary to #1.  Push Lasix t.i.d. 200 mg, started      in the ER.  Probable dialysis next 24 hours.  Permanent catheter      placement in the a.m. and dialysis education.  The patient will      need a permanent catheter placed as well.  3. Hyperkalemia.  Give 60 g of oral Kayexalate in the emergency room.      Urgent dialysis tonight if unable to tolerate.  4. Anemia.  Check iron studies.  Note Epogen at this time.  Hemoglobin      is stable.  5. Secondary hyperparathyroidism.  Check PTH level and phosphorus.  6. Dilated cardiomyopathy.  Restart beta blocker at later time once #1      and #2 have improved.      Leafy Kindle, PA      [place] C. Florene Glen, M.D.  Electronically Signed    MY/MEDQ  D:  06/16/2006  T:  06/16/2006  Job:  [id number]

## 2010-10-29 ENCOUNTER — Other Ambulatory Visit (HOSPITAL_COMMUNITY): Payer: Self-pay | Admitting: Nephrology

## 2010-10-29 DIAGNOSIS — N186 End stage renal disease: Secondary | ICD-10-CM

## 2010-10-30 ENCOUNTER — Ambulatory Visit (HOSPITAL_COMMUNITY)
Admission: RE | Admit: 2010-10-30 | Discharge: 2010-10-30 | Disposition: A | Discharge status: Home / Self Care | Payer: Medicare Other | Source: Ambulatory Visit | Attending: Nephrology | Admitting: Nephrology

## 2010-10-30 DIAGNOSIS — Z452 Encounter for adjustment and management of vascular access device: Secondary | ICD-10-CM | POA: Insufficient documentation

## 2010-10-30 DIAGNOSIS — N186 End stage renal disease: Secondary | ICD-10-CM | POA: Insufficient documentation

## 2010-11-01 ENCOUNTER — Ambulatory Visit: Payer: Medicare Other | Admitting: Vascular Surgery

## 2010-11-05 ENCOUNTER — Other Ambulatory Visit (HOSPITAL_COMMUNITY): Payer: Self-pay | Admitting: Nephrology

## 2010-11-05 DIAGNOSIS — N186 End stage renal disease: Secondary | ICD-10-CM

## 2010-11-06 ENCOUNTER — Ambulatory Visit (HOSPITAL_COMMUNITY)
Admission: RE | Admit: 2010-11-06 | Discharge: 2010-11-06 | Disposition: A | Discharge status: Home / Self Care | Payer: Medicare Other | Source: Ambulatory Visit | Attending: Nephrology | Admitting: Nephrology

## 2010-11-06 DIAGNOSIS — Z992 Dependence on renal dialysis: Secondary | ICD-10-CM

## 2010-11-06 DIAGNOSIS — T82598A Other mechanical complication of other cardiac and vascular devices and implants, initial encounter: Secondary | ICD-10-CM | POA: Insufficient documentation

## 2010-11-06 DIAGNOSIS — Y838 Other surgical procedures as the cause of abnormal reaction of the patient, or of later complication, without mention of misadventure at the time of the procedure: Secondary | ICD-10-CM | POA: Insufficient documentation

## 2010-12-10 ENCOUNTER — Other Ambulatory Visit (HOSPITAL_COMMUNITY): Payer: Self-pay | Admitting: Nephrology

## 2010-12-10 ENCOUNTER — Ambulatory Visit (HOSPITAL_COMMUNITY)
Admission: RE | Admit: 2010-12-10 | Discharge: 2010-12-10 | Disposition: A | Discharge status: Home / Self Care | Payer: Medicare Other | Source: Ambulatory Visit | Attending: Nephrology | Admitting: Nephrology

## 2010-12-10 DIAGNOSIS — Z452 Encounter for adjustment and management of vascular access device: Secondary | ICD-10-CM | POA: Insufficient documentation

## 2010-12-10 DIAGNOSIS — N186 End stage renal disease: Secondary | ICD-10-CM | POA: Insufficient documentation

## 2010-12-13 ENCOUNTER — Other Ambulatory Visit: Payer: Self-pay | Admitting: Nephrology

## 2010-12-13 ENCOUNTER — Encounter (HOSPITAL_COMMUNITY): Payer: 59

## 2010-12-13 ENCOUNTER — Ambulatory Visit
Admission: RE | Admit: 2010-12-13 | Discharge: 2010-12-13 | Disposition: A | Discharge status: Home / Self Care | Payer: 59 | Source: Ambulatory Visit | Attending: Nephrology | Admitting: Nephrology

## 2010-12-18 ENCOUNTER — Encounter (HOSPITAL_COMMUNITY)
Admission: RE | Admit: 2010-12-18 | Discharge: 2010-12-18 | Disposition: A | Discharge status: Home / Self Care | Payer: Medicare Other | Source: Ambulatory Visit | Attending: General Surgery | Admitting: General Surgery

## 2010-12-18 LAB — CBC
Platelets: 212 10*3/uL (ref 150–400)
RDW: 15.7 % — ABNORMAL HIGH (ref 11.5–15.5)
WBC: 9.8 10*3/uL (ref 4.0–10.5)

## 2010-12-18 LAB — DIFFERENTIAL
Basophils Absolute: 0.1 10*3/uL (ref 0.0–0.1)
Eosinophils Absolute: 1.8 10*3/uL — ABNORMAL HIGH (ref 0.0–0.7)
Eosinophils Relative: 18 % — ABNORMAL HIGH (ref 0–5)
Lymphocytes Relative: 17 % (ref 12–46)

## 2010-12-20 ENCOUNTER — Ambulatory Visit (HOSPITAL_COMMUNITY)
Admission: RE | Admit: 2010-12-20 | Discharge: 2010-12-21 | Disposition: A | Discharge status: Home / Self Care | Payer: Medicare Other | Source: Ambulatory Visit | Attending: General Surgery | Admitting: General Surgery

## 2010-12-20 DIAGNOSIS — N186 End stage renal disease: Secondary | ICD-10-CM | POA: Insufficient documentation

## 2010-12-20 DIAGNOSIS — Z79899 Other long term (current) drug therapy: Secondary | ICD-10-CM | POA: Insufficient documentation

## 2010-12-20 DIAGNOSIS — Z01812 Encounter for preprocedural laboratory examination: Secondary | ICD-10-CM | POA: Insufficient documentation

## 2010-12-20 DIAGNOSIS — I120 Hypertensive chronic kidney disease with stage 5 chronic kidney disease or end stage renal disease: Secondary | ICD-10-CM | POA: Insufficient documentation

## 2010-12-20 DIAGNOSIS — G4733 Obstructive sleep apnea (adult) (pediatric): Secondary | ICD-10-CM | POA: Insufficient documentation

## 2010-12-20 LAB — BASIC METABOLIC PANEL
CO2: 30 mEq/L (ref 19–32)
Chloride: 91 mEq/L — ABNORMAL LOW (ref 96–112)
Chloride: 94 mEq/L — ABNORMAL LOW (ref 96–112)
GFR calc Af Amer: 7 mL/min — ABNORMAL LOW (ref >60)
Potassium: 5 mEq/L (ref 3.5–5.1)
Sodium: 135 mEq/L (ref 135–145)
Sodium: 136 mEq/L (ref 135–145)

## 2010-12-21 ENCOUNTER — Inpatient Hospital Stay (HOSPITAL_COMMUNITY)
Admission: RE | Admit: 2010-12-21 | Discharge: 2010-12-21 | Disposition: A | Discharge status: Home / Self Care | Payer: Medicare Other | Source: Ambulatory Visit

## 2010-12-21 LAB — BASIC METABOLIC PANEL
CO2: 27 mEq/L (ref 19–32)
GFR calc non Af Amer: 4 mL/min — ABNORMAL LOW (ref >60)
Glucose, Bld: 84 mg/dL (ref 70–99)
Potassium: 5.3 mEq/L — ABNORMAL HIGH (ref 3.5–5.1)
Sodium: 134 mEq/L — ABNORMAL LOW (ref 135–145)

## 2010-12-23 NOTE — Op Note (Signed)
  NAMELINDSAY, GIBEAULT             ACCOUNT NO.:  [id number]  MEDICAL RECORD NO.:  [id number]  LOCATION:  SDSC                         FACILITY:  [facility]  PHYSICIAN:  Marland Kitchen T. Mckenize Mezera, M.D.DATE OF BIRTH:  03-24-1976  DATE OF PROCEDURE:  12/20/2010 DATE OF DISCHARGE:  12/18/2010                              OPERATIVE REPORT   POSTOPERATIVE DIAGNOSIS:  End-stage renal disease.  SURGICAL PROCEDURE:  Laparoscopic placement of peritoneal dialysis catheter.  SURGEON:  Darene Lamer. Elia Keenum, MD  ANESTHESIA:  General.  BRIEF HISTORY:  Mr. Delao is a 35 year old male with end-stage renal disease and poor vascular access.  He desires peritoneal dialysis and has been evaluated at the Dialysis Center and felt to be a good candidate.  We have discussed catheter placement laparoscopically including the nature of the procedure and risks of anesthetic complications, bleeding, infection, intestinal injury and long-term risks of catheter malfunction displacement, peritonitis and exit site infection.  He understands and desires to proceed.  He was brought to the operating room for this procedure.  DESCRIPTION OF OPERATION:  The patient was brought to the operating room and placed in supine position on the operating table and general endotracheal anesthesia was induced.  Of note, it was a somewhat difficult airway.  He received preoperative IV antibiotics.  PAS were in place.  The abdomen was widely sterilely prepped and draped and correct patient procedure were verified.  Access was obtained in the left upper quadrant with a 5-mm Opti-Vu trocar without difficulty and pneumoperitoneum established.  There was no evidence of trocar injury. An 12 mL trocar was then placed just lateral to the umbilicus under direct vision.  A right-sided [place] peritoneal dialysis catheter was introduced through the trocar and the tip of the catheter positioned down into the pelvis.  The Silastic ball was  positioned just deep to the peritoneum with the Teflon cuff just above the peritoneum.  Holding the catheter in this position, the trocar was backed out and catheter was seen to be in good position.  I did place an additional 5-mm trocar to position the coiled tip of the catheter deep into the pelvis.  The abdomen was inspected for hemostasis and there was no evidence of bleeding or trocar injury.  The catheter was in good position.  All CO2 was evacuated and the trocars removed.  The catheter was then tunneled subcutaneously from the 12-mm trocar site to the exit site in the right lower quadrant and the cuff positioned about a centimeter from the exit site.  The extension set was attached to the catheter.  Skin incisions were closed with running 5-0 nylon.  Antibiotic ointment was placed around the exit site and dry sterile dressings were applied.  Sponges and needle counts correct.  The patient taken to recovery in good condition.     Darene Lamer. Isaish Alemu, M.D.     [place]  D:  12/20/2010  T:  12/20/2010  Job:  [id number]  cc:   Windy Kalata, M.D.  Electronically Signed by Excell Seltzer M.D. on 12/23/2010 03:40:17 PM

## 2011-01-16 ENCOUNTER — Encounter (INDEPENDENT_AMBULATORY_CARE_PROVIDER_SITE_OTHER): Payer: Self-pay | Admitting: General Surgery

## 2011-01-17 ENCOUNTER — Ambulatory Visit (INDEPENDENT_AMBULATORY_CARE_PROVIDER_SITE_OTHER): Payer: Medicare Other | Admitting: General Surgery

## 2011-01-17 ENCOUNTER — Encounter (INDEPENDENT_AMBULATORY_CARE_PROVIDER_SITE_OTHER): Payer: Self-pay | Admitting: General Surgery

## 2011-01-17 DIAGNOSIS — Z9889 Other specified postprocedural states: Secondary | ICD-10-CM

## 2011-01-17 NOTE — Progress Notes (Signed)
Patient returns following placement of peritoneal dialysis catheter. He reports no problems and that he is using the catheter and it is functioning well.  On examination his abdomen is soft and nontender. Wounds and exit site looks fine.  He's doing well postoperatively and will return as needed.

## 2011-01-17 NOTE — Patient Instructions (Signed)
Call as needed for problems

## 2011-02-28 LAB — POCT I-STAT 4, (NA,K, GLUC, HGB,HCT)
Glucose, Bld: 79
Hemoglobin: 11.6 — ABNORMAL LOW
Potassium: 5.6 — ABNORMAL HIGH
Sodium: 134 — ABNORMAL LOW

## 2011-02-28 LAB — CBC
HCT: 28.7 — ABNORMAL LOW
Hemoglobin: 9.7 — ABNORMAL LOW
RBC: 3.05 — ABNORMAL LOW
RDW: 15.7 — ABNORMAL HIGH
WBC: 8

## 2011-03-03 LAB — POCT I-STAT 4, (NA,K, GLUC, HGB,HCT): Glucose, Bld: 83

## 2011-03-06 LAB — COMPREHENSIVE METABOLIC PANEL
ALT: 13
AST: 20
CO2: 22
Chloride: 103
GFR calc Af Amer: 4 — ABNORMAL LOW
GFR calc non Af Amer: 3 — ABNORMAL LOW
Glucose, Bld: 81
Sodium: 139
Total Bilirubin: 0.7

## 2011-03-06 LAB — DIFFERENTIAL
Basophils Absolute: 0.1
Basophils Relative: 1
Eosinophils Absolute: 0.2
Eosinophils Relative: 3

## 2011-03-06 LAB — POTASSIUM: Potassium: 4.4

## 2011-03-06 LAB — CBC
Hemoglobin: 12.4 — ABNORMAL LOW
MCV: 92.7
RBC: 3.88 — ABNORMAL LOW
WBC: 5.9

## 2011-03-11 LAB — CATH TIP CULTURE: Culture: >100

## 2011-03-11 LAB — COMPREHENSIVE METABOLIC PANEL
ALT: 18
AST: 19
Albumin: 2.8 — ABNORMAL LOW
CO2: 25
Calcium: 8.8
Chloride: 99
GFR calc Af Amer: 5 — ABNORMAL LOW
GFR calc non Af Amer: 4 — ABNORMAL LOW
Sodium: 137
Total Bilirubin: 0.6

## 2011-03-11 LAB — CBC
HCT: 39.3
Hemoglobin: 12.9 — ABNORMAL LOW
MCHC: 33
MCHC: 33.2
RBC: 3.32 — ABNORMAL LOW
RDW: 16.3 — ABNORMAL HIGH
RDW: 16.3 — ABNORMAL HIGH
WBC: 8.3

## 2011-03-11 LAB — DIFFERENTIAL
Basophils Absolute: 0
Eosinophils Relative: 0
Lymphocytes Relative: 5 — ABNORMAL LOW
Monocytes Absolute: 1.3 — ABNORMAL HIGH

## 2011-03-11 LAB — CULTURE, ROUTINE-ABSCESS

## 2011-03-11 LAB — POCT I-STAT, CHEM 8
BUN: 27 — ABNORMAL HIGH
Calcium, Ion: 1.09 — ABNORMAL LOW
TCO2: 32

## 2011-03-11 LAB — BASIC METABOLIC PANEL
BUN: 35 — ABNORMAL HIGH
CO2: 27
CO2: 29
Calcium: 9.3
Chloride: 96
Creatinine, Ser: 9.9 — ABNORMAL HIGH
GFR calc Af Amer: 7 — ABNORMAL LOW
GFR calc non Af Amer: 8 — ABNORMAL LOW
Glucose, Bld: 102 — ABNORMAL HIGH
Glucose, Bld: 80
Potassium: 4.1
Sodium: 135

## 2011-03-12 LAB — POCT I-STAT 4, (NA,K, GLUC, HGB,HCT)
HCT: 42
Hemoglobin: 14.3
Potassium: 5.2 — ABNORMAL HIGH

## 2011-03-22 ENCOUNTER — Emergency Department (HOSPITAL_COMMUNITY): Payer: Medicare Other

## 2011-03-22 ENCOUNTER — Emergency Department (HOSPITAL_COMMUNITY)
Admission: EM | Admit: 2011-03-22 | Discharge: 2011-03-23 | Disposition: A | Discharge status: Home / Self Care | Payer: Medicare Other | Attending: Emergency Medicine | Admitting: Emergency Medicine

## 2011-03-22 DIAGNOSIS — K659 Peritonitis, unspecified: Secondary | ICD-10-CM | POA: Insufficient documentation

## 2011-03-22 DIAGNOSIS — I120 Hypertensive chronic kidney disease with stage 5 chronic kidney disease or end stage renal disease: Secondary | ICD-10-CM | POA: Insufficient documentation

## 2011-03-22 DIAGNOSIS — R109 Unspecified abdominal pain: Secondary | ICD-10-CM | POA: Insufficient documentation

## 2011-03-22 DIAGNOSIS — Z992 Dependence on renal dialysis: Secondary | ICD-10-CM | POA: Insufficient documentation

## 2011-03-22 DIAGNOSIS — N186 End stage renal disease: Secondary | ICD-10-CM | POA: Insufficient documentation

## 2011-03-22 LAB — COMPREHENSIVE METABOLIC PANEL WITH GFR
BUN: 81 mg/dL — ABNORMAL HIGH (ref 6–23)
Calcium: 10.1 mg/dL (ref 8.4–10.5)
GFR calc Af Amer: 3 mL/min — ABNORMAL LOW (ref >90)
Glucose, Bld: 129 mg/dL — ABNORMAL HIGH (ref 70–99)
Total Protein: 8.1 g/dL (ref 6.0–8.3)

## 2011-03-22 LAB — COMPREHENSIVE METABOLIC PANEL
ALT: 32 U/L (ref 0–53)
AST: 17 U/L (ref 0–37)
Albumin: 3.4 g/dL — ABNORMAL LOW (ref 3.5–5.2)
Alkaline Phosphatase: 53 U/L (ref 39–117)
CO2: 24 mEq/L (ref 19–32)
Chloride: 91 mEq/L — ABNORMAL LOW (ref 96–112)
Creatinine, Ser: 17.97 mg/dL — ABNORMAL HIGH (ref 0.50–1.35)
GFR calc non Af Amer: 3 mL/min — ABNORMAL LOW (ref >90)
Potassium: 4.7 mEq/L (ref 3.5–5.1)
Sodium: 137 mEq/L (ref 135–145)
Total Bilirubin: 0.3 mg/dL (ref 0.3–1.2)

## 2011-03-22 LAB — DIFFERENTIAL
Basophils Absolute: 0 10*3/uL (ref 0.0–0.1)
Basophils Relative: 0 % (ref 0–1)
Eosinophils Absolute: 0 10*3/uL (ref 0.0–0.7)
Eosinophils Relative: 0 % (ref 0–5)
Lymphocytes Relative: 11 % — ABNORMAL LOW (ref 12–46)
Lymphs Abs: 0.4 10*3/uL — ABNORMAL LOW (ref 0.7–4.0)
Monocytes Absolute: 0.2 K/uL (ref 0.1–1.0)
Monocytes Relative: 6 % (ref 3–12)
Neutro Abs: 3.4 K/uL (ref 1.7–7.7)
Neutrophils Relative %: 83 % — ABNORMAL HIGH (ref 43–77)

## 2011-03-22 LAB — BODY FLUID CELL COUNT WITH DIFFERENTIAL
Eos, Fluid: 1 %
Lymphs, Fluid: 5 %
Monocyte-Macrophage-Serous Fluid: 11 % — ABNORMAL LOW (ref 50–90)
Neutrophil Count, Fluid: 83 % — ABNORMAL HIGH (ref 0–25)
Total Nucleated Cell Count, Fluid: 22550 cu mm — ABNORMAL HIGH (ref 0–1000)

## 2011-03-22 LAB — CBC
HCT: 40.1 % (ref 39.0–52.0)
Hemoglobin: 13.3 g/dL (ref 13.0–17.0)
MCH: 31.1 pg (ref 26.0–34.0)
MCHC: 33.2 g/dL (ref 30.0–36.0)
MCV: 93.9 fL (ref 78.0–100.0)
Platelets: 263 10*3/uL (ref 150–400)
RBC: 4.27 MIL/uL (ref 4.22–5.81)
RDW: 15.5 % (ref 11.5–15.5)
WBC: 4.1 10*3/uL (ref 4.0–10.5)

## 2011-03-22 LAB — LIPASE, BLOOD: Lipase: 209 U/L — ABNORMAL HIGH (ref 11–59)

## 2011-03-22 LAB — GRAM STAIN

## 2011-03-24 LAB — PATHOLOGIST SMEAR REVIEW

## 2011-03-25 LAB — BODY FLUID CULTURE

## 2011-04-11 ENCOUNTER — Other Ambulatory Visit: Payer: Self-pay | Admitting: Nephrology

## 2011-04-11 DIAGNOSIS — K650 Generalized (acute) peritonitis: Secondary | ICD-10-CM

## 2011-04-14 ENCOUNTER — Ambulatory Visit
Admission: RE | Admit: 2011-04-14 | Discharge: 2011-04-14 | Disposition: A | Discharge status: Home / Self Care | Payer: Medicare Other | Source: Ambulatory Visit | Attending: Nephrology | Admitting: Nephrology

## 2011-04-14 DIAGNOSIS — K650 Generalized (acute) peritonitis: Secondary | ICD-10-CM

## 2011-04-14 MED ORDER — IOHEXOL 300 MG/ML  SOLN
125.0000 mL | Freq: Once | INTRAMUSCULAR | Status: AC | PRN
  Administered 2011-04-14: 125 mL via INTRAVENOUS

## 2011-04-22 ENCOUNTER — Ambulatory Visit (HOSPITAL_BASED_OUTPATIENT_CLINIC_OR_DEPARTMENT_OTHER): Payer: Medicare Other | Attending: Nephrology | Admitting: Radiology

## 2011-04-22 DIAGNOSIS — G4733 Obstructive sleep apnea (adult) (pediatric): Secondary | ICD-10-CM

## 2011-04-23 ENCOUNTER — Telehealth (INDEPENDENT_AMBULATORY_CARE_PROVIDER_SITE_OTHER): Payer: Self-pay | Admitting: General Surgery

## 2011-04-26 DIAGNOSIS — G4733 Obstructive sleep apnea (adult) (pediatric): Secondary | ICD-10-CM

## 2011-04-26 NOTE — Procedures (Signed)
NAME:  [name], [name]             ACCOUNT NO.:  [id number]  MEDICAL RECORD NO.:  [id number]          PATIENT TYPE:  OUT  LOCATION:  SLEEP CENTER                 FACILITY:  [facility]  PHYSICIAN:  Kavina Cantave D. Annamaria Boots, MD, FCCP, FACPDATE OF BIRTH:  10/19/74  DATE OF STUDY:  04/22/2011                           NOCTURNAL POLYSOMNOGRAM  REFERRING PHYSICIAN:  Donato Heinz, M.D.  INDICATION FOR STUDY:  Hypersomnia with sleep apnea.  EPWORTH SLEEPINESS SCORE:  Epworth sleepiness score 14/24.  BMI 43.7, weight 340 pounds, height 74 inches, neck 22 inches.  Home medications are charted and reviewed.  This study was done as a daytime sleep study to accommodate the patient's usual schedule.  MEDICATIONS:  SLEEP ARCHITECTURE:  Split study protocol.  Lights out at 8:54 a.m., lights on at 3:00 p.m.  During the diagnostic phase, total recording time 120.5 minutes with sleep efficiency 97.6%.  Stage I was 30.3%, stage II 69.3%, stage III absent, REM 0.4% of total sleep time.  Sleep latency 0.  REM latency 104 minutes.  Awake after sleep onset 3 minutes. Arousal index 106.6.  Bedtime medication: None.  RESPIRATORY DATA:  Split study protocol.  Apnea-hypopnea index (AHI) 106.6 per hour.  A total of 214 events was scored including 200 obstructive apneas, 13 mixed apneas, 1 hypopnea.  Events were not positional.  No REM was recorded.  CPAP was titrated to maximum tolerated pressure of 15 CWP, with a residual AHI 90.6 per hour.  It was then changed to bilevel (BiPAP) and titrated to inspiratory pressure of 23, and expiratory pressure of 20 with a residual AHI of 39 per hour. He wore a large ResMed Quattro full-face mask with heated humidifier and Bi-Flex of 3.  The technician was not able to achieve total control on either CPAP or BiPAP.  OXYGEN DATA:  Extremely loud snoring before CPAP with oxygen desaturation to a nadir of 47% on room air.  With CPAP titration, mean oxygen saturation held 87.3%  on room air.  Snoring was not reduced and was still intermittently loud to very loud.  CARDIAC DATA:  Normal sinus rhythm.  MOVEMENT-PARASOMNIA:  No significant movement disturbance.  No bathroom trips.  IMPRESSIONS-RECOMMENDATIONS: 1. Severe obstructive sleep apnea/hypopnea syndrome, AHI 106.6 per     hour with non-positional events, extremely loud snoring, and oxygen     desaturation to a nadir of 47% on room air. 2. Incomplete control with CPAP/BiPAP titration.  CPAP was titrated to     a maximum tolerated pressure of 15 CWP, with a residual AHI of 90.6     per hour.  BiPAP was then titrated to inspiratory pressure 23 and     expiratory pressure of 20 CWP, for a residual AHI of 39 per hour.     He wore a large ResMed Quattro full-face mask with heated     humidifier and Bi-Flex of 3. 3. Significant difficulty controlling obstructive apnea with     CPAP/BiPAP strongly suggests mechanical upper airway obstruction.     Suggest ENT evaluation for correctable airway problems.  Hopefully     anatomy can be improved such that PAP will provide better     therapeutic control. If not, would  encourage weight loss, and try BiPAP 23/20.     Hampton Wixom D. Annamaria Boots, MD, [facility], [place], Tax adviser of Sleep Medicine Electronically Signed    CDY/MEDQ  D:  04/26/2011 10:49:16  T:  04/26/2011 12:42:44  Job:  [id number]

## 2011-04-28 NOTE — Telephone Encounter (Signed)
Appointment scheduled for 05/08/11 with Dr. Excell Seltzer

## 2011-05-05 ENCOUNTER — Telehealth (INDEPENDENT_AMBULATORY_CARE_PROVIDER_SITE_OTHER): Payer: Self-pay

## 2011-05-05 NOTE — Telephone Encounter (Signed)
[name] called from kidney center and asked if patient can be seen sooner due to him being very sick. They need cath pulled as soon as possible due to peritonitis.

## 2011-05-06 ENCOUNTER — Ambulatory Visit (INDEPENDENT_AMBULATORY_CARE_PROVIDER_SITE_OTHER): Payer: Medicare Other | Admitting: General Surgery

## 2011-05-06 ENCOUNTER — Telehealth (INDEPENDENT_AMBULATORY_CARE_PROVIDER_SITE_OTHER): Payer: Self-pay

## 2011-05-06 NOTE — Telephone Encounter (Signed)
Dr Excell Seltzer was paged to call Clarise Cruz regarding patient.

## 2011-05-06 NOTE — Telephone Encounter (Signed)
Per Dr. Excell Seltzer patient need's to be seen today, spoke with [name], patient states he's in dialysis today and can not come to office.  He has been rescheduled from 05/08/11 to 05/07/11 with Dr. Rise Patience for PD Catheter Removal due to Peritonitis.

## 2011-05-07 ENCOUNTER — Encounter (INDEPENDENT_AMBULATORY_CARE_PROVIDER_SITE_OTHER): Payer: Self-pay | Admitting: General Surgery

## 2011-05-07 ENCOUNTER — Encounter (HOSPITAL_COMMUNITY): Payer: Self-pay | Admitting: Pharmacy Technician

## 2011-05-07 ENCOUNTER — Ambulatory Visit (INDEPENDENT_AMBULATORY_CARE_PROVIDER_SITE_OTHER): Payer: 59 | Admitting: General Surgery

## 2011-05-07 VITALS — BP 160/96 | HR 68 | Temp 97.4°F | Resp 24 | Ht 74.0 in | Wt 332.0 lb

## 2011-05-07 DIAGNOSIS — N189 Chronic kidney disease, unspecified: Secondary | ICD-10-CM

## 2011-05-07 DIAGNOSIS — I10 Essential (primary) hypertension: Secondary | ICD-10-CM

## 2011-05-07 DIAGNOSIS — Z992 Dependence on renal dialysis: Secondary | ICD-10-CM

## 2011-05-08 ENCOUNTER — Inpatient Hospital Stay (HOSPITAL_COMMUNITY): Payer: Medicare Other | Admitting: Certified Registered"

## 2011-05-08 ENCOUNTER — Other Ambulatory Visit: Payer: Self-pay

## 2011-05-08 ENCOUNTER — Ambulatory Visit (HOSPITAL_COMMUNITY)
Admission: RE | Admit: 2011-05-08 | Discharge: 2011-05-08 | Disposition: A | Discharge status: Home / Self Care | Payer: Medicare Other | Source: Ambulatory Visit | Attending: General Surgery | Admitting: General Surgery

## 2011-05-08 ENCOUNTER — Encounter (INDEPENDENT_AMBULATORY_CARE_PROVIDER_SITE_OTHER): Payer: Self-pay | Admitting: General Surgery

## 2011-05-08 ENCOUNTER — Encounter (HOSPITAL_COMMUNITY): Payer: Self-pay | Admitting: *Deleted

## 2011-05-08 ENCOUNTER — Encounter (HOSPITAL_COMMUNITY): Payer: Self-pay | Admitting: Certified Registered"

## 2011-05-08 ENCOUNTER — Encounter (INDEPENDENT_AMBULATORY_CARE_PROVIDER_SITE_OTHER): Payer: Medicare Other | Admitting: General Surgery

## 2011-05-08 ENCOUNTER — Encounter (HOSPITAL_COMMUNITY)
Admission: RE | Disposition: A | Discharge status: Home / Self Care | Payer: Self-pay | Source: Ambulatory Visit | Attending: General Surgery

## 2011-05-08 DIAGNOSIS — I120 Hypertensive chronic kidney disease with stage 5 chronic kidney disease or end stage renal disease: Secondary | ICD-10-CM | POA: Insufficient documentation

## 2011-05-08 DIAGNOSIS — B9689 Other specified bacterial agents as the cause of diseases classified elsewhere: Secondary | ICD-10-CM | POA: Insufficient documentation

## 2011-05-08 DIAGNOSIS — F1210 Cannabis abuse, uncomplicated: Secondary | ICD-10-CM | POA: Insufficient documentation

## 2011-05-08 DIAGNOSIS — G4730 Sleep apnea, unspecified: Secondary | ICD-10-CM | POA: Insufficient documentation

## 2011-05-08 DIAGNOSIS — I509 Heart failure, unspecified: Secondary | ICD-10-CM | POA: Insufficient documentation

## 2011-05-08 DIAGNOSIS — R000 Tachycardia, unspecified: Secondary | ICD-10-CM | POA: Insufficient documentation

## 2011-05-08 DIAGNOSIS — T8571XA Infection and inflammatory reaction due to peritoneal dialysis catheter, initial encounter: Secondary | ICD-10-CM | POA: Insufficient documentation

## 2011-05-08 DIAGNOSIS — Z01812 Encounter for preprocedural laboratory examination: Secondary | ICD-10-CM | POA: Insufficient documentation

## 2011-05-08 DIAGNOSIS — Y841 Kidney dialysis as the cause of abnormal reaction of the patient, or of later complication, without mention of misadventure at the time of the procedure: Secondary | ICD-10-CM | POA: Insufficient documentation

## 2011-05-08 DIAGNOSIS — Z01810 Encounter for preprocedural cardiovascular examination: Secondary | ICD-10-CM | POA: Insufficient documentation

## 2011-05-08 DIAGNOSIS — K219 Gastro-esophageal reflux disease without esophagitis: Secondary | ICD-10-CM | POA: Insufficient documentation

## 2011-05-08 DIAGNOSIS — Z992 Dependence on renal dialysis: Secondary | ICD-10-CM | POA: Diagnosis present

## 2011-05-08 DIAGNOSIS — N189 Chronic kidney disease, unspecified: Secondary | ICD-10-CM

## 2011-05-08 DIAGNOSIS — E039 Hypothyroidism, unspecified: Secondary | ICD-10-CM | POA: Insufficient documentation

## 2011-05-08 DIAGNOSIS — N186 End stage renal disease: Secondary | ICD-10-CM | POA: Insufficient documentation

## 2011-05-08 HISTORY — DX: Sleep apnea, unspecified: G47.30

## 2011-05-08 HISTORY — PX: CAPD REMOVAL: SHX5234

## 2011-05-08 HISTORY — DX: Gastro-esophageal reflux disease without esophagitis: K21.9

## 2011-05-08 LAB — AFB CULTURE WITH SMEAR (NOT AT ARMC)

## 2011-05-08 LAB — POTASSIUM: Potassium: 4.4 mEq/L (ref 3.5–5.1)

## 2011-05-08 LAB — SURGICAL PCR SCREEN: Staphylococcus aureus: NEGATIVE

## 2011-05-08 LAB — POCT I-STAT 4, (NA,K, GLUC, HGB,HCT): Sodium: 139 mEq/L (ref 135–145)

## 2011-05-08 SURGERY — CONTINUOUS AMBULATORY PERITONEAL DIALYSIS  (CAPD) CATHETER REMOVAL
Anesthesia: General

## 2011-05-08 MED ORDER — GLYCOPYRROLATE 0.2 MG/ML IJ SOLN
INTRAMUSCULAR | Status: DC | PRN
  Administered 2011-05-08: .8 mg via INTRAVENOUS

## 2011-05-08 MED ORDER — DEXTROSE 5 % IV SOLN
2.0000 g | INTRAVENOUS | Status: DC
  Filled 2011-05-08: qty 2

## 2011-05-08 MED ORDER — MUPIROCIN 2 % EX OINT: TOPICAL_OINTMENT | Freq: Two times a day (BID) | CUTANEOUS | Status: DC

## 2011-05-08 MED ORDER — FENTANYL CITRATE 0.05 MG/ML IJ SOLN
INTRAMUSCULAR | Status: DC | PRN
  Administered 2011-05-08 (×2): 100 ug via INTRAVENOUS

## 2011-05-08 MED ORDER — ONDANSETRON HCL 4 MG/2ML IJ SOLN
INTRAMUSCULAR | Status: DC | PRN
  Administered 2011-05-08: 4 mg via INTRAVENOUS

## 2011-05-08 MED ORDER — ROCURONIUM BROMIDE 100 MG/10ML IV SOLN
INTRAVENOUS | Status: DC | PRN
  Administered 2011-05-08: 30 mg via INTRAVENOUS

## 2011-05-08 MED ORDER — CIPROFLOXACIN IN D5W 400 MG/200ML IV SOLN
INTRAVENOUS | Status: DC | PRN
  Administered 2011-05-08: 400 mg via INTRAVENOUS

## 2011-05-08 MED ORDER — HYDROCODONE-ACETAMINOPHEN 5-325 MG PO TABS
ORAL_TABLET | ORAL | Status: AC
  Filled 2011-05-08: qty 1

## 2011-05-08 MED ORDER — NEOSTIGMINE METHYLSULFATE 1 MG/ML IJ SOLN
INTRAMUSCULAR | Status: DC | PRN
  Administered 2011-05-08: 4 mg via INTRAVENOUS

## 2011-05-08 MED ORDER — FENTANYL CITRATE 0.05 MG/ML IJ SOLN
25.0000 ug | INTRAMUSCULAR | Status: DC | PRN
  Administered 2011-05-08 (×3): 25 ug via INTRAVENOUS

## 2011-05-08 MED ORDER — SODIUM CHLORIDE 0.9 % IV SOLN: INTRAVENOUS | Status: DC

## 2011-05-08 MED ORDER — PROPOFOL 10 MG/ML IV EMUL
INTRAVENOUS | Status: DC | PRN
  Administered 2011-05-08: 250 mg via INTRAVENOUS

## 2011-05-08 MED ORDER — MUPIROCIN 2 % EX OINT
TOPICAL_OINTMENT | CUTANEOUS | Status: AC
  Filled 2011-05-08: qty 22

## 2011-05-08 MED ORDER — HYDROCODONE-ACETAMINOPHEN 5-325 MG PO TABS: 1.0000 | ORAL_TABLET | ORAL | Status: AC | PRN

## 2011-05-08 MED ORDER — SODIUM CHLORIDE 0.9 % IV SOLN
INTRAVENOUS | Status: DC | PRN
  Administered 2011-05-08: 18:00:00 via INTRAVENOUS

## 2011-05-08 MED ORDER — CIPROFLOXACIN IN D5W 400 MG/200ML IV SOLN
INTRAVENOUS | Status: AC
  Filled 2011-05-08: qty 200

## 2011-05-08 MED ORDER — HYDROCODONE-ACETAMINOPHEN 5-325 MG PO TABS: 1.0000 | ORAL_TABLET | ORAL | Status: DC | PRN

## 2011-05-08 MED ORDER — SUCCINYLCHOLINE CHLORIDE 20 MG/ML IJ SOLN
INTRAMUSCULAR | Status: DC | PRN
  Administered 2011-05-08: 160 mg via INTRAVENOUS

## 2011-05-08 SURGICAL SUPPLY — 44 items
BLADE SURG 15 STRL LF DISP TIS (BLADE) ×1 IMPLANT
BLADE SURG 15 STRL SS (BLADE) ×2
BLADE SURG ROTATE 9660 (MISCELLANEOUS) IMPLANT
CANISTER SUCTION 2500CC (MISCELLANEOUS) IMPLANT
CLOTH BEACON ORANGE TIMEOUT ST (SAFETY) ×2 IMPLANT
COVER SURGICAL LIGHT HANDLE (MISCELLANEOUS) ×2 IMPLANT
DECANTER SPIKE VIAL GLASS SM (MISCELLANEOUS) IMPLANT
DRAPE LAPAROTOMY T 102X78X121 (DRAPES) ×2 IMPLANT
ELECT CAUTERY BLADE 6.4 (BLADE) ×2 IMPLANT
ELECT REM PT RETURN 9FT ADLT (ELECTROSURGICAL) ×2
ELECTRODE REM PT RTRN 9FT ADLT (ELECTROSURGICAL) ×1 IMPLANT
GAUZE PACKING IODOFORM 1 (PACKING) IMPLANT
GAUZE SPONGE 4X4 12PLY STRL LF (GAUZE/BANDAGES/DRESSINGS) ×1 IMPLANT
GAUZE SPONGE 4X4 16PLY XRAY LF (GAUZE/BANDAGES/DRESSINGS) ×2 IMPLANT
GLOVE BIO SURGEON STRL SZ7 (GLOVE) ×1 IMPLANT
GLOVE BIO SURGEON STRL SZ7.5 (GLOVE) ×2 IMPLANT
GLOVE BIOGEL PI IND STRL 6.5 (GLOVE) IMPLANT
GLOVE BIOGEL PI INDICATOR 6.5 (GLOVE) ×1
GLOVE ORTHOPEDIC STR SZ6.5 (GLOVE) ×1 IMPLANT
GOWN STRL NON-REIN LRG LVL3 (GOWN DISPOSABLE) ×5 IMPLANT
KIT BASIN OR (CUSTOM PROCEDURE TRAY) ×2 IMPLANT
KIT ROOM TURNOVER OR (KITS) ×2 IMPLANT
NDL HYPO 25GX1X1/2 BEV (NEEDLE) IMPLANT
NEEDLE HYPO 25GX1X1/2 BEV (NEEDLE) IMPLANT
NS IRRIG 1000ML POUR BTL (IV SOLUTION) ×2 IMPLANT
PACK SURGICAL SETUP 50X90 (CUSTOM PROCEDURE TRAY) ×2 IMPLANT
PAD ARMBOARD 7.5X6 YLW CONV (MISCELLANEOUS) ×2 IMPLANT
PENCIL BUTTON HOLSTER BLD 10FT (ELECTRODE) ×2 IMPLANT
SPONGE GAUZE 4X4 12PLY (GAUZE/BANDAGES/DRESSINGS) ×2 IMPLANT
SUT CHROMIC 3 0 SH 27 (SUTURE) ×2 IMPLANT
SUT ETHILON 5 0 PS 2 18 (SUTURE) ×2 IMPLANT
SUT PROLENE 0 CT 1 CR/8 (SUTURE) IMPLANT
SUT PROLENE 2 0 CT2 30 (SUTURE) ×2 IMPLANT
SUT VIC AB 2-0 CT3 27 (SUTURE) ×2 IMPLANT
SWAB COLLECTION DEVICE MRSA (MISCELLANEOUS) IMPLANT
SYR BULB 3OZ (MISCELLANEOUS) ×2 IMPLANT
SYR CONTROL 10ML LL (SYRINGE) IMPLANT
TAPE CLOTH SURG 4X10 WHT LF (GAUZE/BANDAGES/DRESSINGS) ×1 IMPLANT
TOWEL OR 17X24 6PK STRL BLUE (TOWEL DISPOSABLE) ×2 IMPLANT
TOWEL OR 17X26 10 PK STRL BLUE (TOWEL DISPOSABLE) ×2 IMPLANT
TUBE ANAEROBIC SPECIMEN COL (MISCELLANEOUS) IMPLANT
TUBE CONNECTING 12X1/4 (SUCTIONS) IMPLANT
WATER STERILE IRR 1000ML POUR (IV SOLUTION) IMPLANT
YANKAUER SUCT BULB TIP NO VENT (SUCTIONS) IMPLANT

## 2011-05-08 NOTE — Anesthesia Postprocedure Evaluation (Signed)
  Anesthesia Post-op Note  Patient: [name]  Procedure(s) Performed:  CONTINUOUS AMBULATORY PERITONEAL DIALYSIS  (CAPD) CATHETER REMOVAL - Removal of CAPD catheter, Dr. requests to go after 100  Patient Location: PACU  Anesthesia Type: General  Level of Consciousness: awake and alert   Airway and Oxygen Therapy: Patient Spontanous Breathing and Patient connected to nasal cannula oxygen  Post-op Pain: mild  Post-op Assessment: Post-op Vital signs reviewed, Patient's Cardiovascular Status Stable, Respiratory Function Stable, Patent Airway, No signs of Nausea or vomiting and Pain level controlled  Post-op Vital Signs: Reviewed and stable  Complications: No apparent anesthesia complications

## 2011-05-08 NOTE — Anesthesia Preprocedure Evaluation (Addendum)
Anesthesia Evaluation  Patient identified by MRN, date of birth, ID band Patient awake    Reviewed: Allergy & Precautions, H&P , NPO status , Patient's Chart, lab work & pertinent test results, reviewed documented beta blocker date and time   Airway Mallampati: III  Neck ROM: Full    Dental  (+) Teeth Intact   Pulmonary sleep apnea ,  clear to auscultation        Cardiovascular hypertension, +CHF Irregular Tachycardia    Neuro/Psych    GI/Hepatic GERD-  ,(+)     substance abuse  marijuana use,   Endo/Other  Hypothyroidism Morbid obesity  Renal/GU      Musculoskeletal   Abdominal (+) obese,   Peds  Hematology   Anesthesia Other Findings   Reproductive/Obstetrics                        Anesthesia Physical Anesthesia Plan  ASA: III  Anesthesia Plan: General   Post-op Pain Management:    Induction: Intravenous  Airway Management Planned: Oral ETT and Video Laryngoscope Planned  Additional Equipment:   Intra-op Plan:   Post-operative Plan: Extubation in OR  Informed Consent: I have reviewed the patients History and Physical, chart, labs and discussed the procedure including the risks, benefits and alternatives for the proposed anesthesia with the patient or authorized representative who has indicated his/her understanding and acceptance.     Plan Discussed with: CRNA and Surgeon  Anesthesia Plan Comments:         Anesthesia Quick Evaluation

## 2011-05-08 NOTE — Anesthesia Procedure Notes (Signed)
Procedure Name: Intubation Date/Time: 05/08/2011 6:02 PM Performed by: Eligha Bridegroom Pre-anesthesia Checklist: Patient identified, Emergency Drugs available, Suction available, Patient being monitored and Timeout performed Patient Re-evaluated:Patient Re-evaluated prior to inductionOxygen Delivery Method: Circle System Utilized Preoxygenation: Pre-oxygenation with 100% oxygen Intubation Type: IV induction Laryngoscope Size: Mac Grade View: Grade II Tube size: 8.0 mm Number of attempts: 1 Airway Equipment and Method: video-laryngoscopy Placement Confirmation: ETT inserted through vocal cords under direct vision,  positive ETCO2 and breath sounds checked- equal and bilateral Secured at: 23 cm Tube secured with: Tape Dental Injury: Teeth and Oropharynx as per pre-operative assessment  Difficulty Due To: Difficulty was anticipated, Difficult Airway- due to large tongue and Difficult Airway- due to reduced neck mobility

## 2011-05-08 NOTE — Transfer of Care (Signed)
Immediate Anesthesia Transfer of Care Note  Patient: [name]  Procedure(s) Performed:  CONTINUOUS AMBULATORY PERITONEAL DIALYSIS  (CAPD) CATHETER REMOVAL - Removal of CAPD catheter, Dr. requests to go after 100  Patient Location: PACU  Anesthesia Type: General  Level of Consciousness: awake, alert , oriented and patient cooperative  Airway & Oxygen Therapy: Patient Spontanous Breathing and Patient connected to nasal cannula oxygen  Post-op Assessment: Report given to PACU RN, Post -op Vital signs reviewed and stable and Patient moving all extremities  Post vital signs: Reviewed and stable  Complications: No apparent anesthesia complications

## 2011-05-08 NOTE — H&P (Addendum)
[name], MD Physician Signed  Progress Notes 05/08/2011 3:12 PM  Related encounter: Office Visit from 05/07/2011 in [facility], [place]  Subjective:      Patient ID: [name], male   DOB: 04-23-1976, 35 y.o.   MRN: [id number]   HPI list of [name] is a 35 year old black male with end-stage renal disease since 2008 secondary to hypertension the patient has had problems with vascular access and Dr. Hunt [name] was asked to place a CAPD catheter in June of this year patient's predominant medical problems is that he is 310 pounds his blood pressure was mildly elevated when he was originally seen and Dr. Elnoria [name] for place the CAPD can't catheter laparoscopically and patient was switched from hemodialysis to chronic ambulatory peritoneal dialysis. The patient's peritoneal dialysis did not last very long and it was noted that the patient was not adequately cleared with the peritoneal dialysis and he was switched back to hemodialysis sometime in late September October 1 approximately 3 weeks ago and course the patient still has a CAPD catheter but is not engaged he was sitting at home training and had a culture of the fluid and it grew Serratia and he was having abdominal pain he was originally put on intravenous Fortaz with each hemodialysis and then he's been switched to her milligrams daily for last 2 weeks after approximately one week of the IV antibiotics his abdominal pain has subsided and they called Dr. Tobie [name] her to see if a CAPD cath removed Dr. Hunt [name] has been held at [place] long this week the patient's surgery course has been occurring and he asked if I could see the patient and see about removing the catheter hopefully on the patient was in agreement to allow me to remove the CAPD catheter and I called and talked to home training and got the information about his cultures grow insure ischial arrange for him to be on hemodialysis this morning Thursday at 7:30 instead of his usual  time at 10:30 so that hopefully he can get a CAPD cath removed after he had completed his hemodialysis on the sign day and not the following day as we usually schedule him the patient understands that he needs to be n.p.o. and has been added to the OR schedule as an add on for Thursday afternoon, and seizure       Review of Systems No current facility-administered medications for this visit.       No current outpatient prescriptions on file.       Facility-Administered Medications Ordered in Other Visits   Medication  Dose  Route  Frequency  Provider  Last Rate  Last Dose   .  mupirocin ointment (BACTROBAN) 2 %     Nasal  BID  [name], MD          No Known Allergies Past Surgical History   Procedure  Date   .  Arteriovenous graft placement  08/09/10       Left Thigh Graft by Dr. Gae [name]   .  Insertion of dialysis catheter  10/05/10       Right Femoral Cath insertion by Dr. Adele [name].  Pt ahas had several caths inserted.            Objective:    Physical ExamBP 160/96  Pulse 68  Temp(Src) 97.4 F (36.3 C) (Temporal)  Resp 24  Ht 6\' 2"  (1.88 m)  Wt 332 lb (150.594 kg)  BMI 42.63 kg/m2 Plan the patient  was in the office Thursday yesterday he did not have acute tenderness in his abdomen is catheter exits to the right of the umbilicus and there is no evidence of an active infection on the exit site his incision site which was done laparoscopically is just above the exit site and I have reviewed the patient's operative note performed Dr. Hunt [name] to make sure that no clear incision is to be removed this nonfunctioning and infected CAPD catheter area and the patient asked whether he thought he could return to work on Monday antiplatelet can be done we'll small incision that would be possible irritant patient does not plan to stay overnight at times this can unless farther hemodialysis is needed     Assessment:    I should CAPD he did not successfully  dialyzed and for that reason the catheter function and then has gotten a secondarily infected catheter secondary to Serratia there in the 2 months at which time the catheter is no longer being used. Plan removal with general anesthesia as an outpatient today   Plan:    2 schedule her for surgery Thursday afternoon n.p.o. after midnight   Pt han dialysis today tolerated ok NPO for removal capd cath gen anes Brief examination done no change. [name] ordered

## 2011-05-08 NOTE — Brief Op Note (Signed)
05/08/2011  7:15 PM  PATIENT:  [name]  35 y.o. male  PRE-OPERATIVE DIAGNOSIS: infected capd cath crf   POST-OPERATIVE DIAGNOSIS:  Infected CAPD cath, CRF, obesity  PROCEDURE:  Procedure(s): CONTINUOUS AMBULATORY PERITONEAL DIALYSIS  (CAPD) CATHETER REMOVAL  SURGEON:  Surgeon(s): [name], MD  PHYSICIAN ASSISTANT: nurse  ASSISTANTS: none   ANESTHESIA:   general  EBL:   miminal  BLOOD ADMINISTERED:none  DRAINS: none   LOCAL MEDICATIONS USED:  NONE  SPECIMEN:  Source of Specimen:  culture peritoneal fluid aerobic anaerobic  DISPOSITION OF SPECIMEN:  PATHOLOGY  COUNTS:  YES  TOURNIQUET:  * No tourniquets in log *  DICTATION: .Other Dictation: Dictation Number [id number]  PLAN OF CARE: Discharge to home after PACU  PATIENT DISPOSITION:  PACU - hemodynamically stable.   Delay start of Pharmacological VTE agent (>24hrs) due to surgical blood loss or risk of bleeding:  {YES/NO/NOT APPLICABLE:20182

## 2011-05-08 NOTE — Progress Notes (Signed)
Subjective:     Patient ID: [name], male   DOB: 01-08-1976, 35 y.o.   MRN: [id number]  HPI list of [place] is a 35 year old black male with end-stage renal disease since 2008 secondary to hypertension the patient has had problems with vascular access and Dr. [name] was asked to place a CAPD catheter in June of this year patient's predominant medical problems is that he is 310 pounds his blood pressure was mildly elevated when he was originally seen and Dr. [name] for place the CAPD can't catheter laparoscopically and patient was switched from hemodialysis to chronic ambulatory peritoneal dialysis. The patient's peritoneal dialysis did not last very long and it was noted that the patient was not adequately cleared with the peritoneal dialysis and he was switched back to hemodialysis sometime in late September October 1 approximately 3 weeks ago and course the patient still has a CAPD catheter but is not engaged he was sitting at home training and had a culture of the fluid and it grew Serratia and he was having abdominal pain he was originally put on intravenous Fortaz with each hemodialysis and then he's been switched to her milligrams daily for last 2 weeks after approximately one week of the IV antibiotics his abdominal pain has subsided and they called Dr. [name] her to see if a CAPD cath removed Dr. [name] has been held at [facility] long this week the patient's surgery course has been occurring and he asked if I could see the patient and see about removing the catheter hopefully on the patient was in agreement to allow me to remove the CAPD catheter and I called and talked to home training and got the information about his cultures grow insure ischial arrange for him to be on hemodialysis this morning Thursday at 7:30 instead of his usual time at 10:30 so that hopefully he can get a CAPD cath removed after he had completed his hemodialysis on the sign day and not the following day as we usually schedule  him the patient understands that he needs to be n.p.o. and has been added to the OR schedule as an add on for Thursday afternoon, and seizure    Review of Systems No current facility-administered medications for this visit.   No current outpatient prescriptions on file.   Facility-Administered Medications Ordered in Other Visits  Medication Dose Route Frequency Provider Last Rate Last Dose  . mupirocin ointment (BACTROBAN) 2 %   Nasal BID [name], MD       No Known Allergies Past Surgical History  Procedure Date  . Arteriovenous graft placement 08/09/10    Left Thigh Graft by Dr. [name]  . Insertion of dialysis catheter 10/05/10    Right Femoral Cath insertion by Dr. [name].  Pt ahas had several caths inserted.         Objective:   Physical ExamBP 160/96  Pulse 68  Temp(Src) 97.4 F (36.3 C) (Temporal)  Resp 24  Ht 6\' 2"  (1.88 m)  Wt 332 lb (150.594 kg)  BMI 42.63 kg/m2 Plan the patient was in the office Thursday yesterday he did not have acute tenderness in his abdomen is catheter exits to the right of the umbilicus and there is no evidence of an active infection on the exit site his incision site which was done laparoscopically is just above the exit site and I have reviewed the patient's operative note performed Dr. [name] to make sure that no clear incision is to be removed  this nonfunctioning and infected CAPD catheter area and the patient asked whether he thought he could return to work on Monday antiplatelet can be done we'll small incision that would be possible irritant patient does not plan to stay overnight at times this can unless farther hemodialysis is needed     Assessment:     I should CAPD he did not successfully dialyzed and for that reason the catheter function and then has gotten a secondarily infected catheter secondary to Serratia there in the 2 months at which time the catheter is no longer being used. Plan removal with general  anesthesia as an outpatient today    Plan:     2 schedule her for surgery Thursday afternoon n.p.o. after midnight

## 2011-05-08 NOTE — Preoperative (Signed)
Beta Blockers   Reason not to administer Beta Blockers:Not Applicable 

## 2011-05-09 ENCOUNTER — Encounter (HOSPITAL_COMMUNITY): Payer: Self-pay | Admitting: General Surgery

## 2011-05-09 MED FILL — Mupirocin Oint 2%: CUTANEOUS | Qty: 22 | Status: AC

## 2011-05-09 NOTE — Op Note (Signed)
NAMEAMY, Robert NO.:  [name]  MEDICAL RECORD NO.:  [id number]  LOCATION:  [facility]                         FACILITY:  [facility]  PHYSICIAN:  Orson Ape. Ysidro Ramsay, M.D.DATE OF BIRTH:  08/22/1975  DATE OF PROCEDURE:  05/08/2011 DATE OF DISCHARGE:                              OPERATIVE REPORT   PREOPERATIVE DIAGNOSES: 1. Infected CAPD catheter, Serratia in a patient with a chronic renal     failure.  He is presently on hemodialysis. 2. He has got a history of hypertension and exogenous obesity.  HISTORY:  [name] is a 35 year old male who is about 310 pounds, history of hypertension.  She has been on, I think, chronic renal failure for about 5 years, and this summer I think it was June, he had a CAPD catheter placed by Dr. Excell Seltzer when he was having problems with vascular access.  He was only dialyzed about 2 months with CAPD.  It really did not clear his renal failure very well and then he was switched back to hemodialysis, I think sometime about October 1.  He still got a CAPD catheter and then approximately 3 weeks ago had onset of pretty severe abdominal pain.  The nurses at home training flushed his catheter grew Serratia and he has been on [place] and then Cipro orally, having received 1 g of Fortaz with hemodialysis.  The patient states that the abdominal pain subsided over a period of approximately 1 week, and since then, I am trying to flush his catheter.  He has just got purulent discharge and he was not being adequately dialyzed anyway, so his catheter needs to be removed.  Dr. Excell Seltzer __________ this week and he was called and asked if I could see the patient in the office which I did yesterday, and I will plan on removing the catheter today. I talked with Karena Addison at [facility] [employer] today and she said [name] was the best according to the lab cultures even though he appeared to improve when he was on the combination of Fortaz and Cipro and he is  presently on oral Cipro for the past week.  When I saw the patient in the office today or yesterday, he was not acutely tender. I arranged for him to undergo his hemodialysis which is a Tuesday, Thursday, Saturday and he takes about 5 hours for each hemodialysis.  He completed his hemodialysis, was n.p.o. and then came to the hospital afterwards.  An I- STAT today shows a hemoglobin of 12, a hematocrit of 34, his potassium is 3.2.  EKG showed a kind of a sinus tachycardia and he was given 400 mg of Cipro.  The patient was taken back to the operative suite. Induction of general anesthesia, endotracheal tube by Dr. Maryjean Morn and then the abdomen was prepped with Betadine surgical scrub and solution and draped in a sterile manner.  His nose culture is positive I think for MRSA even though we have not cultured any MRSA on his fluid cultures. The patient was positioned such that we could place his hip down a little bit, and then I prepped the abdomen including the catheter and this was placed laparoscopically and I elected to make a little  transverse incision where the 10 mm port had been and of course the catheter has only been in about 4-5 months, so the rectus muscle could be separated from the internal cuff area.  When I made the little transverse incision, I could see the external cuff, but I went ahead and freed that internally and then divided the catheter more proximal to the external cuff and then removed the distal portion through the little exit site and took it off the operative field.  Then, I could use the internal portion as a handle, removing the internal cuff, it did not look infected, but then followed it down and freed up the fascia through a little transverse incision and then with Angela Nevin, the nurse from neurosurgery, retract and could separate the internal cuff then opened the peritoneum along the little silastic ball and then removed the internal catheter portion.  It was just  packed with kind of a fibrous pussy exudate, and at the completion of surgery, I sent a portion of this exudate for anaerobic and aerobic cultures.  The would itself, I closed the small opening in the posterior fascia peritoneum with a running 2-0 Vicryl, and then the anterior fascia was closed transversely with interrupted sutures of 0 Prolene.  The wound was irrigated. Hemostasis was good.  Predominantly, we are using cautery and there was one layer that was sutured with 0 Vicryl and then I put a couple of 3-0 chromics in the deep abdominal layer and then closed the skin loosely with some interrupted 5-0 nylon.  The patient tolerated the procedure well.  He wants to go home this afternoon and I am going to give him a dose of Fortaz in the recovery room and then make arrangements that he receive both the Cipro orally and the Fortaz intravenously with his hemodialysis treatment for approximately 5 days.  I will see him back in the office next week for wound check and hopefully he is not going to develop one in the abdominal wall area.  He does not actually have acute peritonitis now, the little opening was small enough, I did not really explore the abdomen and hopefully since he is nontender, he will not have a relapse of the peritonitis after the antibiotics are completed.     Orson Ape. Rise Patience, M.D.     WJW/MEDQ  D:  05/08/2011  T:  05/08/2011  Job:  [id number]  cc:   Home Training

## 2011-05-11 LAB — BODY FLUID CULTURE

## 2011-05-13 LAB — ANAEROBIC CULTURE

## 2011-05-26 ENCOUNTER — Ambulatory Visit (INDEPENDENT_AMBULATORY_CARE_PROVIDER_SITE_OTHER): Payer: Medicare Other | Admitting: Pulmonary Disease

## 2011-05-26 ENCOUNTER — Encounter: Payer: Self-pay | Admitting: Pulmonary Disease

## 2011-05-26 VITALS — BP 150/120 | HR 103 | Temp 98.5°F | Ht 74.0 in | Wt 343.4 lb

## 2011-05-26 DIAGNOSIS — G4733 Obstructive sleep apnea (adult) (pediatric): Secondary | ICD-10-CM | POA: Insufficient documentation

## 2011-05-26 NOTE — Progress Notes (Signed)
  Subjective:    Patient ID: [name], male    DOB: [birth year], 35 y.o.   MRN: [id number]  HPI The patient is a 35 year old male who I've been asked to see for very severe obstructive sleep apnea.  He has recently had a split night study where he was found to have an AHI of 107 events per hour, and he failed CPAP titration for adequate control.  He was changed over to bilevel, and even on a pressure of 23/20, he continued to have breakthrough events.  The patient has been noted to have very loud snoring and witnessed apneas, and he is not rested in the afternoons when he awakens.  He states that he will doze off anytime he sits down, and is unable to read or watch television.  He does have sleepiness with driving if there is traffic.  The patient states that his weight is up 20 pounds over the last 2 years.  Sleep Questionnaire: What time do you typically go to bed?( Between what hours) 8:00 am to 10:00am How long does it take you to fall asleep? not long How many times during the night do you wake up? 15 What time do you get out of bed to start your day? 1600 Do you drive or operate heavy machinery in your occupation? No How much has your weight changed (up or down) over the past two years? (In pounds) 20 lb (9.072 kg) Have you ever had a sleep study before? Yes If yes, location of study? [facility] If yes, date of study? 04/2011 Do you currently use CPAP? No Do you wear oxygen at any time? No O2 Flow Rate (L/min) 3 L/min     Review of Systems  Constitutional: Negative for fever and unexpected weight change.  HENT: Negative for ear pain, nosebleeds, congestion, sore throat, rhinorrhea, sneezing, trouble swallowing, dental problem, postnasal drip and sinus pressure.   Eyes: Negative for redness and itching.  Respiratory: Positive for shortness of breath. Negative for cough, chest tightness and wheezing.   Cardiovascular: Negative for palpitations and leg swelling.  Gastrointestinal: Negative for  nausea and vomiting.  Genitourinary: Negative for dysuria.  Musculoskeletal: Negative for joint swelling.  Skin: Negative for rash.  Neurological: Negative for headaches.  Hematological: Does not bruise/bleed easily.  Psychiatric/Behavioral: Negative for dysphoric mood. The patient is not nervous/anxious.        Objective:   Physical Exam Constitutional:  Morbidly obese male, no acute distress  HENT:  Nares patent without discharge, but large turbinates  Oropharynx without exudate, palate and uvula thick and elongated, small posterior pharyngeal  space  Eyes:  Perrla, eomi, no scleral icterus  Neck:  No JVD, no TMG  Cardiovascular: mild tachy, regular rhythm, no rubs or gallops.  No murmurs        Intact distal pulses  Pulmonary :  Normal breath sounds, no stridor or respiratory distress, but +UA noise   No rales, rhonchi, or wheezing  Abdominal:  Soft, nondistended, bowel sounds present.  No tenderness noted.   Musculoskeletal:  1+ lower extremity edema noted.  Lymph Nodes:  No cervical lymphadenopathy noted  Skin:  No cyanosis noted  Neurologic:  Very sleepy, appropriate, moves all 4 extremities without obvious deficit.         Assessment & Plan:

## 2011-05-26 NOTE — Assessment & Plan Note (Signed)
The pt has very severe osa that may not be controlled by positive pressure alone.  I am willing to start treatment with bilevel in order to see if things will improve if we can decrease airway edema associated with collapse, but he may ultimately need trach or upper airway surgery to decrease airway obstruction enough.  I have had a long discussion with the pt about sleep apnea, including its impact on QOL and CV health.  I have also reminded him of his moral responsibility to not drive if sleepy.

## 2011-05-26 NOTE — Patient Instructions (Signed)
Will start you on bipap at a moderate pressure level to allow you to adapt.  Will then increase pressure as tolerated. followup with me in 5 weeks, but call if having issues with tolerance. Work on weight loss

## 2011-06-07 DIAGNOSIS — N186 End stage renal disease: Secondary | ICD-10-CM

## 2011-06-07 DIAGNOSIS — G4730 Sleep apnea, unspecified: Secondary | ICD-10-CM

## 2011-06-07 HISTORY — DX: Dependence on renal dialysis: N18.6

## 2011-06-07 HISTORY — DX: Sleep apnea, unspecified: G47.30

## 2011-07-02 ENCOUNTER — Ambulatory Visit: Payer: Medicare Other | Admitting: Pulmonary Disease

## 2011-10-31 ENCOUNTER — Encounter: Payer: Self-pay | Admitting: Pulmonary Disease

## 2011-10-31 ENCOUNTER — Ambulatory Visit (INDEPENDENT_AMBULATORY_CARE_PROVIDER_SITE_OTHER): Payer: 59 | Admitting: Pulmonary Disease

## 2011-10-31 VITALS — BP 120/84 | HR 94 | Temp 97.6°F | Ht 74.0 in | Wt 316.0 lb

## 2011-10-31 DIAGNOSIS — G4733 Obstructive sleep apnea (adult) (pediatric): Secondary | ICD-10-CM

## 2011-10-31 NOTE — Progress Notes (Signed)
  Subjective:    Patient ID: [name], male    DOB: [birth year], 36 y.o.   MRN: [id number]  HPI The patient comes in today for followup of his very severe obstructive sleep apnea.  He was started on bilevel at the last visit, and has been tolerating this fairly well.  The patient states that his compliance has been excellent, and that he does feel he is sleeping better with improved daytime energy.  We started the patient on a moderate pressure level, but he currently is nowhere near his optimal pressure.  He denies any issues with mask fit or leaks.  He is having no trouble with the pressure tolerance.   Review of Systems  Constitutional: Negative for fever and unexpected weight change.  HENT: Negative for ear pain, nosebleeds, congestion, sore throat, rhinorrhea, sneezing, trouble swallowing, dental problem, postnasal drip and sinus pressure.   Eyes: Negative for redness and itching.  Respiratory: Negative for cough, chest tightness, shortness of breath and wheezing.   Cardiovascular: Negative for palpitations and leg swelling.  Gastrointestinal: Negative for nausea and vomiting.  Genitourinary: Negative for dysuria.  Musculoskeletal: Negative for joint swelling.  Skin: Negative for rash.  Neurological: Negative for headaches.  Hematological: Does not bruise/bleed easily.  Psychiatric/Behavioral: Negative for dysphoric mood. The patient is not nervous/anxious.        Objective:   Physical Exam Obese male in no acute distress No skin breakdown or pressure necrosis from the CPAP mask Lower extremities with mild edema, no cyanosis The patient appears mildly sleepy, but very appropriate, and moves all 4 extremities.       Assessment & Plan:

## 2011-10-31 NOTE — Assessment & Plan Note (Signed)
The patient is doing fairly well with bilevel, and is wearing the device fairly compliantly by his history.  He feels that it does improve his sleep and also how he feels during his awake hours.  I have reminded him that he is not at his therapeutic pressure, and we'll therefore increase the bilevel setting to 18/15.  His optimal pressure is higher than this, but he is unlikely to be compliant with the device at this levels.  I've stressed to him the importance of even modest weight loss so that his pressure needs will decrease.

## 2011-10-31 NOTE — Patient Instructions (Signed)
Stay on cpap, but will increase pressure to 18/15. Work on weight loss.  This is key to decreasing your pressure needs.  If you lose enough weight, you can come off machine. followup with me in one year if doing well.

## 2012-02-25 ENCOUNTER — Other Ambulatory Visit (HOSPITAL_COMMUNITY): Payer: Self-pay | Admitting: Nephrology

## 2012-02-25 DIAGNOSIS — N186 End stage renal disease: Secondary | ICD-10-CM

## 2012-02-26 ENCOUNTER — Other Ambulatory Visit (HOSPITAL_COMMUNITY): Payer: Self-pay

## 2012-03-02 ENCOUNTER — Ambulatory Visit (HOSPITAL_COMMUNITY)
Admission: RE | Admit: 2012-03-02 | Discharge: 2012-03-02 | Disposition: A | Discharge status: Home / Self Care | Payer: Medicare Other | Source: Ambulatory Visit | Attending: Nephrology | Admitting: Nephrology

## 2012-03-02 DIAGNOSIS — Y832 Surgical operation with anastomosis, bypass or graft as the cause of abnormal reaction of the patient, or of later complication, without mention of misadventure at the time of the procedure: Secondary | ICD-10-CM | POA: Insufficient documentation

## 2012-03-02 DIAGNOSIS — T82898A Other specified complication of vascular prosthetic devices, implants and grafts, initial encounter: Secondary | ICD-10-CM | POA: Insufficient documentation

## 2012-03-02 DIAGNOSIS — I724 Aneurysm of artery of lower extremity: Secondary | ICD-10-CM | POA: Insufficient documentation

## 2012-03-02 DIAGNOSIS — N186 End stage renal disease: Secondary | ICD-10-CM | POA: Insufficient documentation

## 2012-03-02 MED ORDER — IOHEXOL 300 MG/ML  SOLN
100.0000 mL | Freq: Once | INTRAMUSCULAR | Status: AC | PRN
  Administered 2012-03-02: 32 mL via INTRAVENOUS

## 2012-03-02 NOTE — Procedures (Signed)
Successful LT FEMORAL AVG SHUNTOGRAM NO COMP STABLE NO STENOSIS EVIDENT FULL REPORT IN PACS

## 2012-03-04 ENCOUNTER — Telehealth (HOSPITAL_COMMUNITY): Payer: Self-pay | Admitting: *Deleted

## 2012-03-04 NOTE — Telephone Encounter (Signed)
Radiology Post procedure phone call attempted.  Message left.

## 2012-06-09 HISTORY — PX: KIDNEY TRANSPLANT: SHX239

## 2012-11-02 DIAGNOSIS — T8619 Other complication of kidney transplant: Secondary | ICD-10-CM | POA: Insufficient documentation

## 2012-11-02 DIAGNOSIS — Z992 Dependence on renal dialysis: Secondary | ICD-10-CM

## 2012-11-02 DIAGNOSIS — D899 Disorder involving the immune mechanism, unspecified: Secondary | ICD-10-CM

## 2012-11-02 DIAGNOSIS — Z940 Kidney transplant status: Secondary | ICD-10-CM | POA: Insufficient documentation

## 2012-11-03 ENCOUNTER — Ambulatory Visit: Payer: Medicare Other | Admitting: Pulmonary Disease

## 2012-12-09 DIAGNOSIS — R300 Dysuria: Secondary | ICD-10-CM | POA: Insufficient documentation

## 2012-12-12 DIAGNOSIS — R7881 Bacteremia: Secondary | ICD-10-CM | POA: Insufficient documentation

## 2016-01-17 DIAGNOSIS — Z8639 Personal history of other endocrine, nutritional and metabolic disease: Secondary | ICD-10-CM | POA: Insufficient documentation

## 2016-01-17 DIAGNOSIS — Z0000 Encounter for general adult medical examination without abnormal findings: Secondary | ICD-10-CM | POA: Insufficient documentation

## 2017-01-25 DIAGNOSIS — I10 Essential (primary) hypertension: Secondary | ICD-10-CM | POA: Insufficient documentation

## 2017-11-03 DIAGNOSIS — E1165 Type 2 diabetes mellitus with hyperglycemia: Secondary | ICD-10-CM | POA: Insufficient documentation

## 2017-11-03 DIAGNOSIS — IMO0002 Reserved for concepts with insufficient information to code with codable children: Secondary | ICD-10-CM | POA: Insufficient documentation

## 2018-01-25 ENCOUNTER — Encounter: Payer: Self-pay | Admitting: Family Medicine

## 2018-01-25 ENCOUNTER — Other Ambulatory Visit: Payer: Self-pay

## 2018-01-25 ENCOUNTER — Ambulatory Visit (INDEPENDENT_AMBULATORY_CARE_PROVIDER_SITE_OTHER): Payer: 59 | Admitting: Family Medicine

## 2018-01-25 VITALS — BP 140/100 | HR 79 | Temp 98.4°F | Ht 74.0 in | Wt 351.6 lb

## 2018-01-25 DIAGNOSIS — E119 Type 2 diabetes mellitus without complications: Secondary | ICD-10-CM | POA: Insufficient documentation

## 2018-01-25 DIAGNOSIS — Z13220 Encounter for screening for lipoid disorders: Secondary | ICD-10-CM

## 2018-01-25 DIAGNOSIS — I10 Essential (primary) hypertension: Secondary | ICD-10-CM | POA: Diagnosis not present

## 2018-01-25 DIAGNOSIS — Z940 Kidney transplant status: Secondary | ICD-10-CM | POA: Diagnosis not present

## 2018-01-25 DIAGNOSIS — Z79899 Other long term (current) drug therapy: Secondary | ICD-10-CM | POA: Diagnosis not present

## 2018-01-25 DIAGNOSIS — Z0000 Encounter for general adult medical examination without abnormal findings: Secondary | ICD-10-CM | POA: Diagnosis not present

## 2018-01-25 LAB — LIPID PANEL
CHOL/HDL RATIO: 6
Cholesterol: 255 mg/dL — ABNORMAL HIGH (ref 0–200)
HDL: 46 mg/dL (ref >39.00)
LDL CALC: 179 mg/dL — AB (ref 0–99)
NONHDL: 209.48
Triglycerides: 152 mg/dL — ABNORMAL HIGH (ref 0.0–149.0)
VLDL: 30.4 mg/dL (ref 0.0–40.0)

## 2018-01-25 LAB — POCT URINALYSIS DIPSTICK
Bilirubin, UA: NEGATIVE
GLUCOSE UA: NEGATIVE
Ketones, UA: NEGATIVE
LEUKOCYTES UA: NEGATIVE
Nitrite, UA: NEGATIVE
Protein, UA: POSITIVE — AB
SPEC GRAV UA: 1.02 (ref 1.010–1.025)
UROBILINOGEN UA: 0.2 U/dL
pH, UA: 7 (ref 5.0–8.0)

## 2018-01-25 LAB — COMPREHENSIVE METABOLIC PANEL
ALT: 13 U/L (ref 0–53)
AST: 10 U/L (ref 0–37)
Albumin: 3.5 g/dL (ref 3.5–5.2)
Alkaline Phosphatase: 31 U/L — ABNORMAL LOW (ref 39–117)
BUN: 46 mg/dL — AB (ref 6–23)
CHLORIDE: 108 meq/L (ref 96–112)
CO2: 22 meq/L (ref 19–32)
Calcium: 9.5 mg/dL (ref 8.4–10.5)
Creatinine, Ser: 3.77 mg/dL — ABNORMAL HIGH (ref 0.40–1.50)
GFR: 22.65 mL/min — ABNORMAL LOW (ref >60.00)
GLUCOSE: 97 mg/dL (ref 70–99)
Potassium: 4.1 mEq/L (ref 3.5–5.1)
SODIUM: 139 meq/L (ref 135–145)
Total Bilirubin: 0.4 mg/dL (ref 0.2–1.2)
Total Protein: 6.2 g/dL (ref 6.0–8.3)

## 2018-01-25 LAB — CBC
HEMATOCRIT: 39.8 % (ref 39.0–52.0)
Hemoglobin: 12.7 g/dL — ABNORMAL LOW (ref 13.0–17.0)
MCHC: 32 g/dL (ref 30.0–36.0)
MCV: 84 fl (ref 78.0–100.0)
Platelets: 271 10*3/uL (ref 150.0–400.0)
RBC: 4.73 Mil/uL (ref 4.22–5.81)
RDW: 15 % (ref 11.5–15.5)
WBC: 6.1 10*3/uL (ref 4.0–10.5)

## 2018-01-25 LAB — TSH: TSH: 1.71 u[IU]/mL (ref 0.35–4.50)

## 2018-01-25 LAB — HEMOGLOBIN A1C: HEMOGLOBIN A1C: 6.3 % (ref 4.6–6.5)

## 2018-01-25 NOTE — Assessment & Plan Note (Signed)
Followed by [facility] who is managing current medications.  Creatinine worsened between May-->July.  Rechecking again today.  Discussed importance of keeping BP and diabetes controlled.

## 2018-01-25 NOTE — Assessment & Plan Note (Addendum)
Well adult, chronic issues addressed today as well.  See separate A&P Orders Placed This Encounter  Procedures  . Comp Met (CMET)  . Lipid Profile  . TSH  . CBC  . Protein / creatinine ratio, urine  . HgB A1c  . Tacrolimus,Highly Sensitive,LC/MS/MS  . POCT Urinalysis Dipstick  Screenings:  Lipid Immunizations:  Declines pneumovax, has had prevnar Anticipatory guidance/Risk factor reduction:  Per AVS

## 2018-01-25 NOTE — Assessment & Plan Note (Signed)
BP elevated today, has not taken medications Discussed importance of taking medications. Follow low salt diet.  Medications being managed by transplant/nephrology.

## 2018-01-25 NOTE — Assessment & Plan Note (Signed)
He denies history of diabetes however a1c in 07/2017 was 8.2%. He is already on glipizide, encouraged to monitor glucose at home.  Follow low carb diet He started an exercise program recently, encourage to keep this up.  Labs ordered

## 2018-01-25 NOTE — Progress Notes (Signed)
[name] - 42 y.o. male MRN [id number]  Date of birth: 02-01-1976  Subjective Chief Complaint  Patient presents with  . Establish Care    CPE fasting.    HPI [name] is a 42 y.o. male with history of HTN, T2DM, OSA, CKD 2/2 to HTN s/p transplant here today to establish with new PCP and for CPE. He is followed by transplant team @ [facility].  They are requesting labs today in addition to routing CPE labs.  He has had some worsening of his renal function over the past few months.  In regards to HTN he is treated with amlodipine and losartan.  He did not take this yet today.  He tells me that his BP has been controlled with medication.  He denies a history of diabetes but takes glipizide.  He does not monitor blood glucose at home.  He did recently start a nutrition and exercise program and reports 9lb weight loss over the past couple of weeks.   Review of Systems  Constitutional: Negative for chills, fever, malaise/fatigue and weight loss.  HENT: Negative for congestion, ear pain and sore throat.   Eyes: Negative for blurred vision, double vision and pain.  Respiratory: Negative for cough and shortness of breath.   Cardiovascular: Negative for chest pain and palpitations.  Gastrointestinal: Negative for abdominal pain, blood in stool, constipation, heartburn and nausea.  Genitourinary: Negative for dysuria and urgency.  Musculoskeletal: Negative for joint pain and myalgias.  Neurological: Negative for dizziness and headaches.  Endo/Heme/Allergies: Does not bruise/bleed easily.  Psychiatric/Behavioral: Negative for depression. The patient is not nervous/anxious and does not have insomnia.       No Known Allergies  Past Medical History:  Diagnosis Date  . Anemia    ESRD  . CHF (congestive heart failure)   . Chronic kidney disease 06/07/2011   Hemodialysis-TTS Last hemo today 05/08/2011  . GERD (gastroesophageal reflux disease)   . Hypertension 111/29/2012   pt states he takes no HTN meds  . Hypothyroidism, secondary   . Morbid obesity   . Paroxysmal atrial fibrillation   . Sleep apnea 06/07/2011   Pt had sllep study done 2 weeks ago- Dr. Joellyn Quails.  Does not  have a CPAP at this time.    Past Surgical History:  Procedure Laterality Date  . ARTERIOVENOUS GRAFT PLACEMENT  08/09/10   Left Thigh Graft by Dr. [name]  . CAPD REMOVAL  05/08/2011   Procedure: CONTINUOUS AMBULATORY PERITONEAL DIALYSIS  (CAPD) CATHETER REMOVAL;  Surgeon: [name], MD;  Location: [facility] OR;  Service: General;  Laterality: N/A;  Removal of CAPD catheter, Dr. requests to go after 100  . INSERTION OF DIALYSIS CATHETER  10/05/10   Right Femoral Cath insertion by Dr. [name].  Pt ahas had several caths inserted.    Social History   Socioeconomic History  . Marital status: Single    Spouse name: Not on file  . Number of children: 0  . Years of education: Not on file  . Highest education level: Not on file  Occupational History    Employer: [employer]  . Financial resource strain: Not on file  . Food insecurity:    Worry: Not on file    Inability: Not on file  . Transportation needs:    Medical: Not on file    Non-medical: Not on file  Tobacco Use  . Smoking status: Never Smoker  . Smokeless tobacco: Never Used  Substance  and Sexual Activity  . Alcohol use: No  . Drug use: Yes    Frequency: 5.0 times per week    Types: Marijuana  . Sexual activity: Not on file  Lifestyle  . Physical activity:    Days per week: Not on file    Minutes per session: Not on file  . Stress: Not on file  Relationships  . Social connections:    Talks on phone: Not on file    Gets together: Not on file    Attends religious service: Not on file    Active member of club or organization: Not on file    Attends meetings of clubs or organizations: Not on file    Relationship status: Not on file  Other Topics Concern  . Not on file  Social History  Narrative   Lives alone.     Family History  Problem Relation Age of Onset  . Anesthesia problems Neg Hx     Health Maintenance  Topic Date Due  . HEMOGLOBIN A1C  18-Dec-1974  . PNEUMOCOCCAL POLYSACCHARIDE VACCINE AGE 70-64 HIGH RISK  10/20/1976  . FOOT EXAM  10/20/1984  . OPHTHALMOLOGY EXAM  10/20/1984  . HIV Screening  10/20/1989  . TETANUS/TDAP  10/20/1993  . INFLUENZA VACCINE  01/07/2018    ----------------------------------------------------------------------------------------------------------------------------------------------------------------------------------------------------------------- Physical Exam BP (!) 140/100 (BP Location: Left Arm, Patient Position: Sitting, Cuff Size: Large)   Pulse 79   Temp 98.4 F (36.9 C) (Oral)   Ht '6\' 2"'  (1.88 m)   Wt (!) 351 lb 9.6 oz (159.5 kg)   SpO2 95%   BMI 45.14 kg/m   Physical Exam  Constitutional: He is oriented to person, place, and time. He appears well-nourished. No distress.  HENT:  Head: Normocephalic and atraumatic.  Mouth/Throat: Oropharynx is clear and moist.  Eyes: Pupils are equal, round, and reactive to light. EOM are normal. No scleral icterus.  Neck: Neck supple. No thyromegaly present.  Cardiovascular: Normal rate, regular rhythm, normal heart sounds and intact distal pulses.  Pulses:      Dorsalis pedis pulses are 2+ on the right side, and 2+ on the left side.       Posterior tibial pulses are 2+ on the right side, and 2+ on the left side.  Pulmonary/Chest: Effort normal and breath sounds normal.  Abdominal: Soft. Bowel sounds are normal. He exhibits no distension. There is no tenderness.  Large scar along right side of abdomen.   Musculoskeletal: He exhibits no edema or tenderness.       Right foot: There is normal range of motion and no deformity.       Left foot: There is normal range of motion and no deformity.  Feet:  Right Foot:  Protective Sensation: 4 sites tested. 4 sites sensed.  Skin  Integrity: Negative for ulcer, blister, skin breakdown, erythema, warmth, callus or dry skin.  Left Foot:  Protective Sensation: 4 sites tested. 4 sites sensed.  Skin Integrity: Negative for ulcer, blister, skin breakdown, erythema, warmth, callus or dry skin.  Lymphadenopathy:    He has no cervical adenopathy.  Neurological: He is alert and oriented to person, place, and time. No cranial nerve deficit. Coordination normal.  Skin: Skin is warm and dry.  Psychiatric: He has a normal mood and affect. His behavior is normal.    ------------------------------------------------------------------------------------------------------------------------------------------------------------------------------------------------------------------- Assessment and Plan  Type 2 diabetes mellitus without complication, without long-term current use of insulin ([place]) He denies history of diabetes however a1c in 07/2017 was 8.2%. He  is already on glipizide, encouraged to monitor glucose at home.  Follow low carb diet He started an exercise program recently, encourage to keep this up.  Labs ordered   Kidney replaced by transplant Followed by [facility] who is managing current medications.  Creatinine worsened between May-->July.  Rechecking again today.  Discussed importance of keeping BP and diabetes controlled.   Essential hypertension BP elevated today, has not taken medications Discussed importance of taking medications. Follow low salt diet.  Medications being managed by transplant/nephrology.     Well adult exam Well adult, chronic issues addressed today as well.  See separate A&P Orders Placed This Encounter  Procedures  . Comp Met (CMET)  . Lipid Profile  . TSH  . CBC  . Protein / creatinine ratio, urine  . HgB A1c  . Tacrolimus,Highly Sensitive,LC/MS/MS  . POCT Urinalysis Dipstick  Screenings:  Lipid Immunizations:  Declines pneumovax, has had prevnar Anticipatory guidance/Risk  factor reduction:  Per AVS

## 2018-01-25 NOTE — Patient Instructions (Signed)
Adult Wellness Guidelines   Adult Health - for Ages 19 and Over Preventive care is very important for adults. By making some good basic health choices, women and men can boost their own health and well-being. Some of these positive choices include:   Eat a healthy diet  Get regular exercise  Don't use tobacco products  Limit alcohol use  Strive for a healthy weight  Adult Recommendations Screenings Physical Exam Every year, or as directed by your doctor. Body Mass Index (BMI) Every year. Blood Pressure (BP) At least every two years. Colon Cancer Screening Beginning at age 27 - colonoscopy every 10 years, or flexible sigmoidoscopy every five years or fecal blood test annually. Diabetes Screening Those with high blood pressure or high cholesterol should be screened. Others, especially those who are overweight or have a close family history of diabetes, should consider being screened every three years. Vision Screening Every year.  Immunizations Tetanus, Diphtheria, Pertussis (Td/ Tdap) Get Tdap vaccine once, then a Td booster every 10 years. Influenza (Flu) Every year. Herpes Zoster (Shingles) One dose given at age 40 and over. Varicella (Chicken Pox) Two doses if no evidence of immunity. Pneumococcal (Pneumonia) One or two doses for adults age 10 and older, or one or two doses depending on indication. Measles, Mumps, Rubella (MMR) One or two doses for adults ages 63-55 if no evidence of immunity. Human Papillomavirus (HPV) Three doses for women ages 19-26 if not already given. Three doses for men ages 19-21 if not already given.* Hepatitis A Two or three doses for adults age 26 and over.** Hepatitis B Three doses for ages 25 and over.** * Recommendations may vary. Discuss the start and frequency of screenings with your doctor, especially if you are at increased risk. ** For select populations. Discuss with your doctor if this vaccine is right for you.  Women's Health Women  have their own unique health care needs. To stay well, they should make regular screenings a priority. Women should discuss the recommendations listed on the chart with their doctors. Women's Recommendations Mammogram Every year for women beginning at age 53.* Cholesterol Starting age and frequency of screenings are based on your individual risk factors. Talk with your doctor about what is best for you. Pap Test Women ages 21-65: Pap test every three years. Another option for ages 38-65: Pap test and HPV test every five years. Women who have had a hysterectomy or are over age 65 may not need a Pap test.* Osteoporosis Screening Beginning at age 37, or at age 77 if risk factors are present.* Aspirin Use At ages 35-79, talk with your doctor about the benefits and risks of aspirin use. Pelvic Exam Every year for ages 64 and over. Folic Acid Women planning/capable of pregnancy should take a daily supplement containing .4-.8 mg of folic acid for prevention of neural tube defects.  * Recommendations may vary. Discuss screening options with your doctor, especially if you are at increased risk. Sources: [place] Department of Health and Financial controller and the Centers for Disease Control and Prevention, U.S. Preventive Services Task Force   Men's Health Recommendations Men are encouraged to get care as needed and make smart choices. That includes following a healthy lifestyle and getting recommended preventive care services.  Recommended preventative care services are as follows:  Cholesterol Ages 20-35 should be tested if at high risk. Men age 110 and over should be tested. Prostate Cancer Screening Ages 64 and over, discuss the benefits and risks  of screening with your doctor.* Abdominal Aortic Aneurysm Once between ages 65 and 75 if you have ever smoked.  

## 2018-01-26 LAB — PROTEIN / CREATININE RATIO, URINE
Creatinine, Urine: 79 mg/dL (ref 20–320)
Protein/Creat Ratio: 9177 mg/g creat — ABNORMAL HIGH (ref 22–128)
Total Protein, Urine: 725 mg/dL — ABNORMAL HIGH (ref 5–25)

## 2018-01-27 LAB — TACROLIMUS,HIGHLY SENSITIVE,LC/MS/MS: Tacrolimus Lvl: 5.2 mcg/L

## 2018-01-29 ENCOUNTER — Other Ambulatory Visit: Payer: Self-pay | Admitting: Family Medicine

## 2018-01-29 MED ORDER — ATORVASTATIN CALCIUM 20 MG PO TABS
20.0000 mg | ORAL_TABLET | Freq: Every day | ORAL | 1 refills | Status: DC
Start: 2018-01-29 — End: 2018-11-03

## 2018-05-10 ENCOUNTER — Emergency Department (HOSPITAL_COMMUNITY): Payer: 59

## 2018-05-10 ENCOUNTER — Encounter (HOSPITAL_COMMUNITY): Payer: Self-pay | Admitting: *Deleted

## 2018-05-10 ENCOUNTER — Inpatient Hospital Stay (HOSPITAL_COMMUNITY): Payer: 59

## 2018-05-10 ENCOUNTER — Inpatient Hospital Stay (HOSPITAL_COMMUNITY)
Admission: EM | Admit: 2018-05-10 | Discharge: 2018-05-21 | DRG: 981 | Disposition: A | Discharge status: Home / Self Care | Payer: 59 | Attending: Internal Medicine | Admitting: Internal Medicine

## 2018-05-10 DIAGNOSIS — E8779 Other fluid overload: Secondary | ICD-10-CM | POA: Diagnosis present

## 2018-05-10 DIAGNOSIS — D649 Anemia, unspecified: Secondary | ICD-10-CM

## 2018-05-10 DIAGNOSIS — D631 Anemia in chronic kidney disease: Secondary | ICD-10-CM | POA: Diagnosis present

## 2018-05-10 DIAGNOSIS — Z6841 Body Mass Index (BMI) 40.0 and over, adult: Secondary | ICD-10-CM

## 2018-05-10 DIAGNOSIS — N179 Acute kidney failure, unspecified: Secondary | ICD-10-CM | POA: Diagnosis present

## 2018-05-10 DIAGNOSIS — I161 Hypertensive emergency: Secondary | ICD-10-CM | POA: Diagnosis present

## 2018-05-10 DIAGNOSIS — E872 Acidosis, unspecified: Secondary | ICD-10-CM | POA: Diagnosis present

## 2018-05-10 DIAGNOSIS — I480 Paroxysmal atrial fibrillation: Secondary | ICD-10-CM | POA: Diagnosis present

## 2018-05-10 DIAGNOSIS — Z992 Dependence on renal dialysis: Secondary | ICD-10-CM

## 2018-05-10 DIAGNOSIS — Z833 Family history of diabetes mellitus: Secondary | ICD-10-CM | POA: Diagnosis not present

## 2018-05-10 DIAGNOSIS — D899 Disorder involving the immune mechanism, unspecified: Secondary | ICD-10-CM

## 2018-05-10 DIAGNOSIS — F1290 Cannabis use, unspecified, uncomplicated: Secondary | ICD-10-CM | POA: Diagnosis present

## 2018-05-10 DIAGNOSIS — N186 End stage renal disease: Secondary | ICD-10-CM | POA: Diagnosis present

## 2018-05-10 DIAGNOSIS — Z7982 Long term (current) use of aspirin: Secondary | ICD-10-CM | POA: Diagnosis not present

## 2018-05-10 DIAGNOSIS — N250 Renal osteodystrophy: Secondary | ICD-10-CM | POA: Diagnosis present

## 2018-05-10 DIAGNOSIS — E8770 Fluid overload, unspecified: Secondary | ICD-10-CM | POA: Diagnosis not present

## 2018-05-10 DIAGNOSIS — N2581 Secondary hyperparathyroidism of renal origin: Secondary | ICD-10-CM | POA: Diagnosis present

## 2018-05-10 DIAGNOSIS — K219 Gastro-esophageal reflux disease without esophagitis: Secondary | ICD-10-CM | POA: Diagnosis present

## 2018-05-10 DIAGNOSIS — Z79899 Other long term (current) drug therapy: Secondary | ICD-10-CM | POA: Diagnosis not present

## 2018-05-10 DIAGNOSIS — N185 Chronic kidney disease, stage 5: Secondary | ICD-10-CM | POA: Diagnosis not present

## 2018-05-10 DIAGNOSIS — E039 Hypothyroidism, unspecified: Secondary | ICD-10-CM | POA: Diagnosis present

## 2018-05-10 DIAGNOSIS — Z7952 Long term (current) use of systemic steroids: Secondary | ICD-10-CM

## 2018-05-10 DIAGNOSIS — T8612 Kidney transplant failure: Principal | ICD-10-CM | POA: Diagnosis present

## 2018-05-10 DIAGNOSIS — E119 Type 2 diabetes mellitus without complications: Secondary | ICD-10-CM

## 2018-05-10 DIAGNOSIS — Z01810 Encounter for preprocedural cardiovascular examination: Secondary | ICD-10-CM | POA: Diagnosis not present

## 2018-05-10 DIAGNOSIS — T82898A Other specified complication of vascular prosthetic devices, implants and grafts, initial encounter: Secondary | ICD-10-CM | POA: Diagnosis not present

## 2018-05-10 DIAGNOSIS — Z940 Kidney transplant status: Secondary | ICD-10-CM

## 2018-05-10 DIAGNOSIS — Y830 Surgical operation with transplant of whole organ as the cause of abnormal reaction of the patient, or of later complication, without mention of misadventure at the time of the procedure: Secondary | ICD-10-CM | POA: Diagnosis present

## 2018-05-10 DIAGNOSIS — E785 Hyperlipidemia, unspecified: Secondary | ICD-10-CM | POA: Diagnosis present

## 2018-05-10 DIAGNOSIS — N184 Chronic kidney disease, stage 4 (severe): Secondary | ICD-10-CM | POA: Diagnosis not present

## 2018-05-10 DIAGNOSIS — D849 Immunodeficiency, unspecified: Secondary | ICD-10-CM | POA: Diagnosis present

## 2018-05-10 DIAGNOSIS — I160 Hypertensive urgency: Secondary | ICD-10-CM | POA: Diagnosis present

## 2018-05-10 DIAGNOSIS — N189 Chronic kidney disease, unspecified: Secondary | ICD-10-CM | POA: Diagnosis not present

## 2018-05-10 DIAGNOSIS — I120 Hypertensive chronic kidney disease with stage 5 chronic kidney disease or end stage renal disease: Secondary | ICD-10-CM | POA: Diagnosis present

## 2018-05-10 DIAGNOSIS — G4733 Obstructive sleep apnea (adult) (pediatric): Secondary | ICD-10-CM | POA: Diagnosis present

## 2018-05-10 DIAGNOSIS — R609 Edema, unspecified: Secondary | ICD-10-CM | POA: Diagnosis present

## 2018-05-10 DIAGNOSIS — E1122 Type 2 diabetes mellitus with diabetic chronic kidney disease: Secondary | ICD-10-CM | POA: Diagnosis present

## 2018-05-10 DIAGNOSIS — I10 Essential (primary) hypertension: Secondary | ICD-10-CM | POA: Diagnosis present

## 2018-05-10 DIAGNOSIS — Z01818 Encounter for other preprocedural examination: Secondary | ICD-10-CM | POA: Diagnosis not present

## 2018-05-10 DIAGNOSIS — I5020 Unspecified systolic (congestive) heart failure: Secondary | ICD-10-CM | POA: Diagnosis not present

## 2018-05-10 LAB — URINALYSIS, ROUTINE W REFLEX MICROSCOPIC
BILIRUBIN URINE: NEGATIVE
Glucose, UA: 150 mg/dL — AB
Ketones, ur: NEGATIVE mg/dL
Nitrite: NEGATIVE
Protein, ur: >=300 mg/dL — AB
Specific Gravity, Urine: 1.012 (ref 1.005–1.030)
pH: 7 (ref 5.0–8.0)

## 2018-05-10 LAB — APTT: aPTT: 36 seconds (ref 24–36)

## 2018-05-10 LAB — RENAL FUNCTION PANEL
Albumin: 2.5 g/dL — ABNORMAL LOW (ref 3.5–5.0)
Anion gap: 19 — ABNORMAL HIGH (ref 5–15)
BUN: 126 mg/dL — ABNORMAL HIGH (ref 6–20)
CO2: 15 mmol/L — ABNORMAL LOW (ref 22–32)
Calcium: 7.3 mg/dL — ABNORMAL LOW (ref 8.9–10.3)
Chloride: 105 mmol/L (ref 98–111)
Creatinine, Ser: 17.81 mg/dL — ABNORMAL HIGH (ref 0.61–1.24)
GFR calc non Af Amer: 3 mL/min — ABNORMAL LOW (ref >60)
GFR, EST AFRICAN AMERICAN: 3 mL/min — AB (ref >60)
Glucose, Bld: 102 mg/dL — ABNORMAL HIGH (ref 70–99)
Phosphorus: >12 mg/dL — ABNORMAL HIGH (ref 2.5–4.6)
Potassium: 4.4 mmol/L (ref 3.5–5.1)
Sodium: 139 mmol/L (ref 135–145)

## 2018-05-10 LAB — COMPREHENSIVE METABOLIC PANEL
ALBUMIN: 2.5 g/dL — AB (ref 3.5–5.0)
ALT: 12 U/L (ref 0–44)
AST: 10 U/L — AB (ref 15–41)
Alkaline Phosphatase: 38 U/L (ref 38–126)
Anion gap: 21 — ABNORMAL HIGH (ref 5–15)
BUN: 128 mg/dL — ABNORMAL HIGH (ref 6–20)
CHLORIDE: 101 mmol/L (ref 98–111)
CO2: 16 mmol/L — AB (ref 22–32)
Calcium: 7.5 mg/dL — ABNORMAL LOW (ref 8.9–10.3)
Creatinine, Ser: 18.64 mg/dL — ABNORMAL HIGH (ref 0.61–1.24)
GFR calc non Af Amer: 3 mL/min — ABNORMAL LOW (ref >60)
GFR, EST AFRICAN AMERICAN: 3 mL/min — AB (ref >60)
GLUCOSE: 126 mg/dL — AB (ref 70–99)
Potassium: 4 mmol/L (ref 3.5–5.1)
SODIUM: 138 mmol/L (ref 135–145)
Total Bilirubin: 0.6 mg/dL (ref 0.3–1.2)
Total Protein: 5.8 g/dL — ABNORMAL LOW (ref 6.5–8.1)

## 2018-05-10 LAB — CBC
HCT: 28.2 % — ABNORMAL LOW (ref 39.0–52.0)
Hemoglobin: 8.7 g/dL — ABNORMAL LOW (ref 13.0–17.0)
MCH: 25.5 pg — ABNORMAL LOW (ref 26.0–34.0)
MCHC: 30.9 g/dL (ref 30.0–36.0)
MCV: 82.7 fL (ref 80.0–100.0)
PLATELETS: 168 10*3/uL (ref 150–400)
RBC: 3.41 MIL/uL — ABNORMAL LOW (ref 4.22–5.81)
RDW: 14.9 % (ref 11.5–15.5)
WBC: 7.8 10*3/uL (ref 4.0–10.5)
nRBC: 0 % (ref 0.0–0.2)

## 2018-05-10 LAB — SODIUM, URINE, RANDOM: Sodium, Ur: 49 mmol/L

## 2018-05-10 LAB — RETICULOCYTES
Immature Retic Fract: 8.3 % (ref 2.3–15.9)
RBC.: 3.44 MIL/uL — AB (ref 4.22–5.81)
Retic Count, Absolute: 38.2 10*3/uL (ref 19.0–186.0)
Retic Ct Pct: 1.1 % (ref 0.4–3.1)

## 2018-05-10 LAB — CBC WITH DIFFERENTIAL/PLATELET
ABS IMMATURE GRANULOCYTES: 0.1 10*3/uL — AB (ref 0.00–0.07)
BASOS ABS: 0 10*3/uL (ref 0.0–0.1)
Basophils Relative: 0 %
Eosinophils Absolute: 0.2 10*3/uL (ref 0.0–0.5)
Eosinophils Relative: 2 %
HCT: 26.6 % — ABNORMAL LOW (ref 39.0–52.0)
HEMOGLOBIN: 8.1 g/dL — AB (ref 13.0–17.0)
IMMATURE GRANULOCYTES: 1 %
LYMPHS PCT: 6 %
Lymphs Abs: 0.4 10*3/uL — ABNORMAL LOW (ref 0.7–4.0)
MCH: 25.2 pg — AB (ref 26.0–34.0)
MCHC: 30.5 g/dL (ref 30.0–36.0)
MCV: 82.9 fL (ref 80.0–100.0)
Monocytes Absolute: 0.7 10*3/uL (ref 0.1–1.0)
Monocytes Relative: 9 %
NEUTROS ABS: 6.4 10*3/uL (ref 1.7–7.7)
NEUTROS PCT: 82 %
NRBC: 0 % (ref 0.0–0.2)
PLATELETS: 141 10*3/uL — AB (ref 150–400)
RBC: 3.21 MIL/uL — AB (ref 4.22–5.81)
RDW: 15 % (ref 11.5–15.5)
WBC: 7.8 10*3/uL (ref 4.0–10.5)

## 2018-05-10 LAB — PROTIME-INR
INR: 1.16
Prothrombin Time: 14.7 seconds (ref 11.4–15.2)

## 2018-05-10 LAB — IRON AND TIBC
Iron: 103 ug/dL (ref 45–182)
SATURATION RATIOS: 61 % — AB (ref 17.9–39.5)
TIBC: 168 ug/dL — ABNORMAL LOW (ref 250–450)
UIBC: 65 ug/dL

## 2018-05-10 LAB — FERRITIN: Ferritin: 1154 ng/mL — ABNORMAL HIGH (ref 24–336)

## 2018-05-10 LAB — VITAMIN B12: Vitamin B-12: 198 pg/mL (ref 180–914)

## 2018-05-10 LAB — CREATININE, URINE, RANDOM: Creatinine, Urine: 79.94 mg/dL

## 2018-05-10 LAB — FOLATE: Folate: 9 ng/mL (ref >5.9)

## 2018-05-10 MED ORDER — FUROSEMIDE 10 MG/ML IJ SOLN
120.0000 mg | Freq: Three times a day (TID) | INTRAVENOUS | Status: DC
  Administered 2018-05-10 – 2018-05-12 (×5): 120 mg via INTRAVENOUS
  Filled 2018-05-10: qty 12
  Filled 2018-05-10 (×2): qty 10
  Filled 2018-05-10: qty 12
  Filled 2018-05-10: qty 10
  Filled 2018-05-10: qty 4

## 2018-05-10 MED ORDER — INSULIN ASPART 100 UNIT/ML ~~LOC~~ SOLN
0.0000 [IU] | Freq: Three times a day (TID) | SUBCUTANEOUS | Status: DC
  Administered 2018-05-16 – 2018-05-21 (×3): 1 [IU] via SUBCUTANEOUS

## 2018-05-10 MED ORDER — HYDRALAZINE HCL 20 MG/ML IJ SOLN
10.0000 mg | Freq: Four times a day (QID) | INTRAMUSCULAR | Status: DC | PRN
  Administered 2018-05-12 (×2): 10 mg via INTRAVENOUS
  Filled 2018-05-10 (×2): qty 1

## 2018-05-10 MED ORDER — SODIUM CHLORIDE 0.9 % IV SOLN: 100.0000 mL | INTRAVENOUS | Status: DC | PRN

## 2018-05-10 MED ORDER — NICARDIPINE HCL IN NACL 20-0.86 MG/200ML-% IV SOLN
3.0000 mg/h | INTRAVENOUS | Status: DC
  Administered 2018-05-10: 5 mg/h via INTRAVENOUS
  Administered 2018-05-11 (×2): 9.5 mg/h via INTRAVENOUS
  Filled 2018-05-10 (×3): qty 200

## 2018-05-10 MED ORDER — HEPARIN SODIUM (PORCINE) 1000 UNIT/ML DIALYSIS
1000.0000 [IU] | INTRAMUSCULAR | Status: DC | PRN
  Administered 2018-05-12: 1000 [IU] via INTRAVENOUS_CENTRAL
  Administered 2018-05-13: 5500 [IU] via INTRAVENOUS_CENTRAL

## 2018-05-10 MED ORDER — PENTAFLUOROPROP-TETRAFLUOROETH EX AERO: 1.0000 "application " | INHALATION_SPRAY | CUTANEOUS | Status: DC | PRN

## 2018-05-10 MED ORDER — LIDOCAINE-PRILOCAINE 2.5-2.5 % EX CREA: 1.0000 "application " | TOPICAL_CREAM | CUTANEOUS | Status: DC | PRN

## 2018-05-10 MED ORDER — HYDRALAZINE HCL 20 MG/ML IJ SOLN
10.0000 mg | Freq: Once | INTRAMUSCULAR | Status: AC
  Administered 2018-05-10: 10 mg via INTRAVENOUS
  Filled 2018-05-10: qty 1

## 2018-05-10 MED ORDER — LABETALOL HCL 5 MG/ML IV SOLN
10.0000 mg | Freq: Once | INTRAVENOUS | Status: AC
  Administered 2018-05-10: 10 mg via INTRAVENOUS
  Filled 2018-05-10: qty 4

## 2018-05-10 MED ORDER — CHLORHEXIDINE GLUCONATE CLOTH 2 % EX PADS
6.0000 | MEDICATED_PAD | Freq: Every day | CUTANEOUS | Status: DC
  Administered 2018-05-13 – 2018-05-21 (×7): 6 via TOPICAL

## 2018-05-10 MED ORDER — ALTEPLASE 2 MG IJ SOLR
2.0000 mg | Freq: Once | INTRAMUSCULAR | Status: DC | PRN
  Filled 2018-05-10: qty 2

## 2018-05-10 MED ORDER — FUROSEMIDE 10 MG/ML IJ SOLN
60.0000 mg | Freq: Three times a day (TID) | INTRAMUSCULAR | Status: DC
  Filled 2018-05-10: qty 6

## 2018-05-10 MED ORDER — LIDOCAINE HCL (PF) 1 % IJ SOLN
5.0000 mL | INTRAMUSCULAR | Status: DC | PRN
  Filled 2018-05-10: qty 5

## 2018-05-10 NOTE — ED Notes (Signed)
Iv attempted x 2 without success. 

## 2018-05-10 NOTE — ED Notes (Signed)
Attempted to collect lab work but patient not in room.

## 2018-05-10 NOTE — ED Triage Notes (Signed)
Pt in c/o generalized swelling, worse to his legs and face, for the last few weeks, pt is s/p kidney transplant in 2014 and is concerned his kidney is failing, reports shortness of breath at night when laying flat, no distress on arrival

## 2018-05-10 NOTE — Consult Note (Signed)
Reason for Consult: Renal failure Referring Physician:  Dr. Wonda Olds  Chief Complaint: Leg swelling  Assessment: 1. AKI on CKD4 with a creatinine that was already 4.8 in 03/03/2018 with inadequate tissue from a tx renal bx 11/2017. 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 Likely has progressed to ESRD. -  Plan on Tunneled HD cath tomorrow and initiate HD. - VVS will need to be consulted for a new access; prior lt fem AVG was thrombosed at time of transplant. Multiple failed upper ext accesses. - Will also check a tac trough level; low on diff. - C3C4 were normal at [facility]. - Urine studies requested. - Check bladder scan to r/o obstruction; low on differential. -  Will consider contacting [facility] (Dr. Esaw Dace, transplant nephrology [phone]) in the AM for further guidance; question is whether a renal biopsy would be beneficial. Regardless, BP would need to be better controlled. 2. DDRT with h/o DGF @ [facility] 3. Hypertensive urgency/emergency - Restart home meds; start nicardipine gtt with goal 30% decrease in MAP in 1st 24hrs. 4. DM 5. Secondary HPT -  Check PTH and phos to manage renal osteodystrophy; likely will need binders. 6. Chronic Anemia -  Check iron panels; start ESA. 7. OSA      HPI: [name] is an 42 y.o. male  DM, HTN, DDRT from [facility] in 2014 after having been on HD + short term PD for 7 years initially followed by Dr. Mercy Moore. He has had progressive worsening of renal function to the point when the creatinine was noted to be 4.8 on 03/03/2018 @ [facility].  Of note his creatinine was 2.8 in 07/2017 w/ a UPC of 6 and suspicion for secondary FSGS with a BMI of 48. He also  had a transplant renal biopsy 11/2017 but the specimen was deemed inadequate w/ medullary tissue and one sclerotic glomeruli. He was supposed to get another transplant renal biopsy that has not be performed yet.  He is on Cellcept 1gm BID, Prograf 3/2mg , Prednisone  5mg  for his immunosuppression.  ROS Pertinent items are noted in HPI.  Chemistry and CBC: Creatinine, Ser  Date/Time Value Ref Range Status  05/10/2018 03:21 PM 18.64 (H) 0.61 - 1.24 mg/dL Final  01/25/2018 08:51 AM 3.77 (H) 0.40 - 1.50 mg/dL Final  03/22/2011 03:56 PM 17.97 (H) 0.50 - 1.35 mg/dL Final  12/21/2010 06:30 AM 13.47 (H) 0.50 - 1.35 mg/dL Final    Comment:    **Please note change in reference range.**  12/20/2010 09:30 AM 10.91 (H) 0.50 - 1.35 mg/dL Final    Comment:    **Please note change in reference range.**  12/20/2010 06:06 AM 10.63 (H) 0.50 - 1.35 mg/dL Final    Comment:    **Please note change in reference range.**  08/10/2010 07:12 AM 12.41 (H) 0.4 - 1.5 mg/dL Final  07/22/2010 06:24 AM 11.3 (H) 0.4 - 1.5 mg/dL Final  02/10/2009 05:10 AM 16.23 (H) 0.4 - 1.5 mg/dL Final  02/09/2009 05:07 AM 14.06 (H) 0.4 - 1.5 mg/dL Final  02/08/2009 05:20 AM 17.04 (H) 0.4 - 1.5 mg/dL Final  02/06/2009 05:24 PM 22.04 (H) 0.4 - 1.5 mg/dL Final  04/25/2008 07:12 AM 14.47 (H)  Final  04/23/2008 04:40 AM 9.90 (H)  Final  04/22/2008 03:28 PM 7.8 (H)  Final  04/22/2008 03:05 PM 7.73 (H)  Final  11/21/2007 06:23 AM 16.74 (H)  Final   Recent Labs  Lab 05/10/18 1521  NA 138  K  4.0  CL 101  CO2 16*  GLUCOSE 126*  BUN 128*  CREATININE 18.64*  CALCIUM 7.5*   Recent Labs  Lab 05/10/18 1521  WBC 7.8  NEUTROABS 6.4  HGB 8.1*  HCT 26.6*  MCV 82.9  PLT 141*   Liver Function Tests: Recent Labs  Lab 05/10/18 1521  AST 10*  ALT 12  ALKPHOS 38  BILITOT 0.6  PROT 5.8*  ALBUMIN 2.5*   No results for input(s): LIPASE, AMYLASE in the last 168 hours. No results for input(s): AMMONIA in the last 168 hours. Cardiac Enzymes: No results for input(s): CKTOTAL, CKMB, CKMBINDEX, TROPONINI in the last 168 hours. Iron Studies: No results for input(s): IRON, TIBC, TRANSFERRIN, FERRITIN in the last 72 hours. PT/INR: @LABRCNTIP (inr:5)  Xrays/Other Studies: ) Results for orders  placed or performed during the hospital encounter of 05/10/18 (from the past 48 hour(s))  CBC with Differential     Status: Abnormal   Collection Time: 05/10/18  3:21 PM  Result Value Ref Range   WBC 7.8 4.0 - 10.5 K/uL   RBC 3.21 (L) 4.22 - 5.81 MIL/uL   Hemoglobin 8.1 (L) 13.0 - 17.0 g/dL   HCT 26.6 (L) 39.0 - 52.0 %   MCV 82.9 80.0 - 100.0 fL   MCH 25.2 (L) 26.0 - 34.0 pg   MCHC 30.5 30.0 - 36.0 g/dL   RDW 15.0 11.5 - 15.5 %   Platelets 141 (L) 150 - 400 K/uL   nRBC 0.0 0.0 - 0.2 %   Neutrophils Relative % 82 %   Neutro Abs 6.4 1.7 - 7.7 K/uL   Lymphocytes Relative 6 %   Lymphs Abs 0.4 (L) 0.7 - 4.0 K/uL   Monocytes Relative 9 %   Monocytes Absolute 0.7 0.1 - 1.0 K/uL   Eosinophils Relative 2 %   Eosinophils Absolute 0.2 0.0 - 0.5 K/uL   Basophils Relative 0 %   Basophils Absolute 0.0 0.0 - 0.1 K/uL   Immature Granulocytes 1 %   Abs Immature Granulocytes 0.10 (H) 0.00 - 0.07 K/uL    Comment: Performed at [facility], [address] [place], [place] [address]  Comprehensive metabolic panel     Status: Abnormal   Collection Time: 05/10/18  3:21 PM  Result Value Ref Range   Sodium 138 135 - 145 mmol/L   Potassium 4.0 3.5 - 5.1 mmol/L   Chloride 101 98 - 111 mmol/L   CO2 16 (L) 22 - 32 mmol/L   Glucose, Bld 126 (H) 70 - 99 mg/dL   BUN 128 (H) 6 - 20 mg/dL   Creatinine, Ser 18.64 (H) 0.61 - 1.24 mg/dL   Calcium 7.5 (L) 8.9 - 10.3 mg/dL   Total Protein 5.8 (L) 6.5 - 8.1 g/dL   Albumin 2.5 (L) 3.5 - 5.0 g/dL   AST 10 (L) 15 - 41 U/L   ALT 12 0 - 44 U/L   Alkaline Phosphatase 38 38 - 126 U/L   Total Bilirubin 0.6 0.3 - 1.2 mg/dL   GFR calc non Af Amer 3 (L) >60 mL/min   GFR calc Af Amer 3 (L) >60 mL/min   Anion gap 21 (H) 5 - 15    Comment: Performed at [facility], [place] [address], [place], [place] [address]  Sodium, urine, random     Status: None   Collection Time: 05/10/18  6:11 PM  Result Value Ref Range   Sodium, Ur 49 mmol/L    Comment: Performed at  [facility], [place] [address] [place], [place] [address]  Creatinine, urine, random     Status: None   Collection Time: 05/10/18  6:11 PM  Result Value Ref Range   Creatinine, Urine 79.94 mg/dL    Comment: Performed at [facility] [address], [place], [place] [address]  Urinalysis, Routine w reflex microscopic     Status: Abnormal   Collection Time: 05/10/18  6:11 PM  Result Value Ref Range   Color, Urine STRAW (A) YELLOW   APPearance CLEAR CLEAR   Specific Gravity, Urine 1.012 1.005 - 1.030   pH 7.0 5.0 - 8.0   Glucose, UA 150 (A) NEGATIVE mg/dL   Hgb urine dipstick SMALL (A) NEGATIVE   Bilirubin Urine NEGATIVE NEGATIVE   Ketones, ur NEGATIVE NEGATIVE mg/dL   Protein, ur >=300 (A) NEGATIVE mg/dL   Nitrite NEGATIVE NEGATIVE   Leukocytes, UA TRACE (A) NEGATIVE   RBC / HPF 0-5 0 - 5 RBC/hpf   WBC, UA 6-10 0 - 5 WBC/hpf   Bacteria, UA RARE (A) NONE SEEN   Squamous Epithelial / LPF 0-5 0 - 5    Comment: Performed at [facility], [place] [address] [place], [place] [address]   Dg Chest 2 View  Result Date: 05/10/2018 CLINICAL DATA:  Generalized swelling. EXAM: CHEST - 2 VIEW COMPARISON:  December 13, 2010 FINDINGS: The heart size and mediastinal contours are within normal limits. Both lungs are clear. The visualized skeletal structures are unremarkable. IMPRESSION: No active cardiopulmonary disease. Electronically Signed   By: Dorise Bullion III M.D   On: 05/10/2018 16:16    PMH:   Past Medical History:  Diagnosis Date  . Anemia    ESRD  . CHF (congestive heart failure) ([place])   . Chronic kidney disease 06/07/2011   Hemodialysis-TTS Last hemo today 05/08/2011  . Diabetes mellitus without complication ([place])   . GERD (gastroesophageal reflux disease)   . Hypertension 111/29/2012   pt states he takes no HTN meds  . Hypothyroidism, secondary   . Morbid obesity ([place])   . Paroxysmal atrial fibrillation ([facility])   . Sleep apnea 06/07/2011   Pt had sllep study done 2  weeks ago- Dr. Joellyn Quails.  Does not  have a CPAP at this time.    PSH:   Past Surgical History:  Procedure Laterality Date  . ARTERIOVENOUS GRAFT PLACEMENT  08/09/10   Left Thigh Graft by Dr. [name]  . CAPD REMOVAL  05/08/2011   Procedure: CONTINUOUS AMBULATORY PERITONEAL DIALYSIS  (CAPD) CATHETER REMOVAL;  Surgeon: [name], MD;  Location: [facility] OR;  Service: General;  Laterality: N/A;  Removal of CAPD catheter, Dr. requests to go after 100  . INSERTION OF DIALYSIS CATHETER  10/05/10   Right Femoral Cath insertion by Dr. [name].  Pt ahas had several caths inserted.  [facility] KIDNEY TRANSPLANT  2014    Allergies: No Known Allergies  Medications:   Prior to Admission medications   Medication Sig Start Date End Date Taking? Authorizing Provider  acetaminophen (TYLENOL) 500 MG tablet Take 1,000 mg by mouth every 6 (six) hours as needed for mild pain or headache.   Yes [provider]  amLODipine (NORVASC) 10 MG tablet  12/29/17  Yes [provider]  aspirin EC 81 MG tablet Take 81 mg by mouth daily.   Yes [provider]  carvedilol (COREG) 25 MG tablet Take 12.5 mg by mouth 2 (two) times daily. 04/01/18 04/01/19 Yes  [provider]  Cholecalciferol (VITAMIN D3) 2000 units capsule Take by mouth.   Yes [provider]  mycophenolate (CELLCEPT) 500 MG tablet Take 1,000 mg by mouth 2 (two) times daily.  12/23/17  Yes [provider]  Omega-3 Fatty Acids (FISH OIL) 1000 MG CAPS Take 1,000 mg by mouth daily.   Yes [provider]  predniSONE (DELTASONE) 5 MG tablet Take 5 mg by mouth daily with breakfast.  06/22/17  Yes [provider]  tacrolimus (PROGRAF) 1 MG capsule Take 2-3 mg by mouth See admin instructions. Take three capsules (3mg  total) at 9 AM and two capsules (2mg  total) at 9 PM 12/23/17  Yes [provider]  atorvastatin (LIPITOR) 20 MG tablet Take 1 tablet (20 mg total) by mouth daily. Patient  not taking: Reported on 05/10/2018 01/29/18   Luetta Nutting, DO    Discontinued Meds:   Medications Discontinued During This Encounter  Medication Reason  . folic acid-vitamin b complex-vitamin c-selenium-zinc (DIALYVITE) 3 MG TABS Patient Preference  . losartan (COZAAR) 25 MG tablet Change in therapy  . glipiZIDE (GLUCOTROL XL) 2.5 MG 24 hr tablet Patient Preference  . furosemide (LASIX) injection 60 mg     Social History:  reports that he has never smoked. He has never used smokeless tobacco. He reports that he has current or past drug history. Drug: Marijuana. Frequency: 5.00 times per week. He reports that he does not drink alcohol.  Family History:   Family History  Problem Relation Age of Onset  . Diabetes Father   . Stroke Father   . Anesthesia problems Neg Hx     Blood pressure (!) 226/133, pulse 88, temperature 98 F (36.7 C), temperature source Oral, resp. rate 13, SpO2 100 %. General appearance: alert, cooperative and appears stated age Head: Normocephalic, without obvious abnormality, atraumatic Eyes: negative Neck: no adenopathy, no carotid bruit, supple, symmetrical, trachea midline and thyroid not enlarged, symmetric, no tenderness/mass/nodules Back: symmetric, no curvature. ROM normal. No CVA tenderness. Resp: clear to auscultation bilaterally Chest wall: no tenderness Cardio: regular rate and rhythm, S1, S2 normal, no murmur, click, rub or gallop GI: soft, non-tender; bowel sounds normal; no masses,  no organomegaly and Obese Extremities: edema 2+ Pulses: 2+ and symmetric Skin: Skin color, texture, turgor normal. No rashes or lesions Lymph nodes: Cervical, supraclavicular, and axillary nodes normal. Neurologic: Grossly normal       Lumi Winslett, [name], MD 05/10/2018, 8:00 PM

## 2018-05-10 NOTE — ED Notes (Signed)
Iv team at [facility]

## 2018-05-10 NOTE — H&P (Signed)
History and Physical    [name] OZD:[id number] DOB: 1975-06-16 DOA: 05/10/2018  PCP: Luetta Nutting, DO  Patient coming from: Home  I have personally briefly reviewed patient's old medical records in [place]  Chief Complaint: Facial swelling/leg swelling/I think my kidneys not working  HPI: [name] is a 42 y.o. male with medical history significant of end-stage kidney disease (felt secondary to hypertension on HD), status post kidney transplant 10/31/2012 followed at [facility], poorly controlled hypertension, type 2 diabetes mellitus, history of congestive heart failure, morbid obesity, gastroesophageal reflux disease, obstructive sleep apnea on CPAP who presents to the ED with a one-week history of increasing lower extremity edema, facial swelling, shortness of breath on exertion, nausea, chills, dry heaves, fatigue, generalized weakness.  Patient denies any fevers, no chest pain, no abdominal pain, no diarrhea, no dysuria, no hematemesis, no hematochezia, no melena, no lightheadedness, no syncope.  Patient does endorse compliance with his medications.  Patient states antihypertensive medications were adjusted in September 2019.  Patient does endorse that he still makes urine.  Patient denies any change in his urine output.  ED Course: Patient seen in the ED noted to have a comprehensive metabolic profile with a bicarb of 16, glucose of 126, BUN of 128, creatinine of 18.64, calcium of 7.5, albumin of 2.5, AST of 10, protein of 5.8.  CBC done had a hemoglobin of 8.1 with a platelet count of 141 otherwise was within normal limits.  Urinalysis was not done.  Chest x-ray which was done was negative for any acute cardiopulmonary disease.  EKG done with normal sinus rhythm with low voltage.  Blood pressure was 221/117.  [facility] were called to admit the patient for further evaluation and management.  Review of Systems: As per HPI otherwise 10 point review of systems  negative.   Past Medical History:  Diagnosis Date  . Anemia    ESRD  . CHF (congestive heart failure) ([place])   . Chronic kidney disease 06/07/2011   Hemodialysis-TTS Last hemo today 05/08/2011  . Diabetes mellitus without complication ([place])   . GERD (gastroesophageal reflux disease)   . Hypertension 111/29/2012   pt states he takes no HTN meds  . Hypothyroidism, secondary   . Morbid obesity ([place])   . Paroxysmal atrial fibrillation (HCC)   . Sleep apnea 06/07/2011   Pt had sllep study done 2 weeks ago- Dr. Joellyn Quails.  Does not  have a CPAP at this time.    Past Surgical History:  Procedure Laterality Date  . ARTERIOVENOUS GRAFT PLACEMENT  08/09/10   Left Thigh Graft by Dr. [name]  . CAPD REMOVAL  05/08/2011   Procedure: CONTINUOUS AMBULATORY PERITONEAL DIALYSIS  (CAPD) CATHETER REMOVAL;  Surgeon: [name], MD;  Location: [facility] OR;  Service: General;  Laterality: N/A;  Removal of CAPD catheter, Dr. requests to go after 100  . INSERTION OF DIALYSIS CATHETER  10/05/10   Right Femoral Cath insertion by Dr. [name].  Pt ahas had several caths inserted.  Marland Kitchen KIDNEY TRANSPLANT  2014     reports that he has never smoked. He has never used smokeless tobacco. He reports that he has current or past drug history. Drug: Marijuana. Frequency: 5.00 times per week. He reports that he does not drink alcohol.  No Known Allergies  Family History  Problem Relation Age of Onset  . Diabetes Father   . Stroke Father   . Anesthesia problems Neg Hx    Mother alive age  64 and healthy.  Father alive age 88 with a history of type 2 diabetes.  Prior to Admission medications   Medication Sig Start Date End Date Taking? Authorizing Provider  acetaminophen (TYLENOL) 500 MG tablet Take 1,000 mg by mouth every 6 (six) hours as needed for mild pain or headache.   Yes [provider]  amLODipine (NORVASC) 10 MG tablet  12/29/17  Yes [provider]  aspirin EC 81 MG tablet  Take 81 mg by mouth daily.   Yes [provider]  carvedilol (COREG) 25 MG tablet Take 12.5 mg by mouth 2 (two) times daily. 04/01/18 04/01/19 Yes [provider]  Cholecalciferol (VITAMIN D3) 2000 units capsule Take by mouth.   Yes [provider]  mycophenolate (CELLCEPT) 500 MG tablet Take 1,000 mg by mouth 2 (two) times daily.  12/23/17  Yes [provider]  Omega-3 Fatty Acids (FISH OIL) 1000 MG CAPS Take 1,000 mg by mouth daily.   Yes [provider]  predniSONE (DELTASONE) 5 MG tablet Take 5 mg by mouth daily with breakfast.  06/22/17  Yes [provider]  tacrolimus (PROGRAF) 1 MG capsule Take 2-3 mg by mouth See admin instructions. Take three capsules (3mg  total) at 9 AM and two capsules (2mg  total) at 9 PM 12/23/17  Yes [provider]  atorvastatin (LIPITOR) 20 MG tablet Take 1 tablet (20 mg total) by mouth daily. Patient not taking: Reported on 05/10/2018 01/29/18   Luetta Nutting, DO    Physical Exam: Vitals:   05/10/18 1803 05/10/18 1809 05/10/18 1815 05/10/18 1830  BP:  (!) 200/115 (!) 213/117 (!) 221/117  Pulse: 96 88 86 90  Resp: 16 20 (!) 23 13  Temp:      TempSrc:      SpO2: 97% 100% 100% 100%    Constitutional: NAD, calm, comfortable.  Obese Vitals:   05/10/18 1803 05/10/18 1809 05/10/18 1815 05/10/18 1830  BP:  (!) 200/115 (!) 213/117 (!) 221/117  Pulse: 96 88 86 90  Resp: 16 20 (!) 23 13  Temp:      TempSrc:      SpO2: 97% 100% 100% 100%   Eyes: PERRLA, lids and conjunctivae normal ENMT: Mucous membranes are dry. Posterior pharynx clear of any exudate or lesions.Normal dentition.  Neck: Thick, supple, no masses, no thyromegaly Respiratory: Decreased breath sounds in the bases.  Scattered crackles.  Poor to fair air movement.  No wheezing.  No rhonchi.  No use of accessory muscles of respiration.   Cardiovascular: Regular rate and rhythm, no murmurs / rubs / gallops.  Distant heart sounds.  Thick neck  unable to assess for JVD.  2+ bilateral lower extremity edema. No carotid bruits.  Abdomen: no tenderness, no masses palpated. No hepatosplenomegaly. Bowel sounds positive.  Musculoskeletal: no clubbing / cyanosis. No joint deformity upper and lower extremities. Good ROM, no contractures. Normal muscle tone.  Skin: no rashes, lesions, ulcers. No induration Neurologic: CN 2-12 grossly intact. Sensation intact, DTR normal. Strength 5/5 in all 4.  Psychiatric: Normal judgment and insight. Alert and oriented x 3. Normal mood.   Labs on Admission: I have personally reviewed following labs and imaging studies  CBC: Recent Labs  Lab 05/10/18 1521  WBC 7.8  NEUTROABS 6.4  HGB 8.1*  HCT 26.6*  MCV 82.9  PLT 017*   Basic Metabolic Panel: Recent Labs  Lab 05/10/18 1521  NA 138  K 4.0  CL 101  CO2 16*  GLUCOSE 126*  BUN 128*  CREATININE 18.64*  CALCIUM 7.5*   GFR: CrCl cannot be calculated (Unknown ideal weight.). Liver Function Tests: Recent Labs  Lab 05/10/18 1521  AST 10*  ALT 12  ALKPHOS 38  BILITOT 0.6  PROT 5.8*  ALBUMIN 2.5*   No results for input(s): LIPASE, AMYLASE in the last 168 hours. No results for input(s): AMMONIA in the last 168 hours. Coagulation Profile: No results for input(s): INR, PROTIME in the last 168 hours. Cardiac Enzymes: No results for input(s): CKTOTAL, CKMB, CKMBINDEX, TROPONINI in the last 168 hours. BNP (last 3 results) No results for input(s): PROBNP in the last 8760 hours. HbA1C: No results for input(s): HGBA1C in the last 72 hours. CBG: No results for input(s): GLUCAP in the last 168 hours. Lipid Profile: No results for input(s): CHOL, HDL, LDLCALC, TRIG, CHOLHDL, LDLDIRECT in the last 72 hours. Thyroid Function Tests: No results for input(s): TSH, T4TOTAL, FREET4, T3FREE, THYROIDAB in the last 72 hours. Anemia Panel: No results for input(s): VITAMINB12, FOLATE, FERRITIN, TIBC, IRON, RETICCTPCT in the last 72 hours. Urine  analysis:    Component Value Date/Time   COLORURINE STRAW (A) 05/10/2018 1811   APPEARANCEUR CLEAR 05/10/2018 1811   LABSPEC 1.012 05/10/2018 1811   PHURINE 7.0 05/10/2018 1811   GLUCOSEU 150 (A) 05/10/2018 1811   HGBUR SMALL (A) 05/10/2018 1811   BILIRUBINUR NEGATIVE 05/10/2018 1811   BILIRUBINUR negative 01/25/2018 0932   KETONESUR NEGATIVE 05/10/2018 1811   PROTEINUR >=300 (A) 05/10/2018 1811   UROBILINOGEN 0.2 01/25/2018 0932   NITRITE NEGATIVE 05/10/2018 1811   LEUKOCYTESUR TRACE (A) 05/10/2018 1811    Radiological Exams on Admission: Dg Chest 2 View  Result Date: 05/10/2018 CLINICAL DATA:  Generalized swelling. EXAM: CHEST - 2 VIEW COMPARISON:  December 13, 2010 FINDINGS: The heart size and mediastinal contours are within normal limits. Both lungs are clear. The visualized skeletal structures are unremarkable. IMPRESSION: No active cardiopulmonary disease. Electronically Signed   By: Dorise Bullion III M.D   On: 05/10/2018 16:16    EKG: Independently reviewed.  Normal sinus rhythm.  Decreased voltage.  Assessment/Plan Principal Problem:   Acute kidney injury superimposed on CKD ([place]) Active Problems:   Volume overload   Hypertensive emergency   Hypertensive urgency   OSA (obstructive sleep apnea)   Essential hypertension   Immunosuppression ([place])   Morbid obesity ([place])   Kidney replaced by transplant   Type 2 diabetes mellitus without complication, without long-term current use of insulin (HCC)   Acidosis   Anemia   #1 acute renal failure on chronic kidney disease stage V/status post kidney transplant versus progressive worsening kidney disease status post transplant Patient with prior history of end-stage renal disease on hemodialysis status post kidney transplant in Oct 31, 2012.  Baseline creatinine used to be 1.15 January 2017.  Per care everywhere patient noted to have a creatinine of 2.8 in February 2019.  Last creatinine was 3.77 on 01/25/2018.  Patient presenting  with volume overload, lower extremity edema, facial swelling, some shortness of breath on exertion.  Crackles noted on physical exam.  Creatinine on admission is 18.64.  Patient also noted to be acidotic with a bicarb of 16 and a BUN of 128.  Will admit patient to stepdown unit.  Check a UA with cultures and sensitivities.  Check a urine sodium, check a urine creatinine, check a renal ultrasound of transplanted kidney with Dopplers, tacrolimus level.  Check a bladder scan.  Patient noted to be volume overloaded.  Will  place on Lasix 120 mg IV every 8 hours.  Strict I's and O's.  Daily weights.  Continue home regimen of CellCept, prednisone, tacrolimus.  Patient may need hemodialysis.  Consult with nephrology for further evaluation and management.  2.  Volume overload Patient noted to be volume overloaded on examination.  Patient presented with a one-week history of facial swelling, lower extremity edema and a 20 pound weight gain.  Patient with worsening renal function.  Physical exam patient noted to have some diffuse crackles.  EKG with no ischemic changes noted.  Check a 2D echo.  Place on Lasix 120 mg IV every 8 hours.  Strict I's and O's.  Daily weights.  3.  Obstructive sleep apnea CPAP nightly.  4.  Hypertensive emergency/urgency Patient noted to have systolic blood pressure of 221/117.  Hydralazine 10 mg IV x1.  Patient will be placed on Lasix 120 mg IV every 8 hours.  Will place back on home dose oral antihypertensive medications of Norvasc, Coreg.  Hydralazine 10 mg IV every 6 hours as needed.  If no significant improvement with blood pressure on current regimen may need to place on a labetalol drip.  Would not drop blood pressure more than 25% in the first 24 hours.  Patient noted in the past to have systolic blood pressures in the 170s to the 180s.  Follow.  5.  Anemia Patient with no overt bleeding.  Likely anemia of chronic disease.  Check an anemia panel.  Follow H&H.  Transfusion  threshold hemoglobin less than 7.  6.  Acidosis Secondary to problem #1.  7.  Diabetes mellitus 2 Check a hemoglobin A1c.  Place on sliding scale insulin.  Hold home regimen of oral hypoglycemic agents.  8.  Hyperlipidemia Check a fasting lipid panel.  Resume home dose statin.  DVT prophylaxis: Heparin Code Status: Full Family Communication: Updated patient.  No family at bedside. Disposition Plan: Likely home when clinically improved and per nephrology. Consults called: Nephrology, Dr. Augustin Coupe Admission status: Admit to inpatient/stepdown unit.   Irine Seal MD [facility] Pager [phone]  If 7PM-7AM, please contact night-coverage [email or web address] Password [facility]  05/10/2018, 6:55 PM

## 2018-05-10 NOTE — ED Provider Notes (Signed)
[facility] EMERGENCY DEPARTMENT Provider Note   CSN: [id number] Arrival date & time: 05/10/18  1416     History   Chief Complaint Chief Complaint  Patient presents with  . Facial Swelling  . Leg Swelling    HPI [name] is a 42 y.o. male with PMH hypertension, heart failure and kidney transplant seen at [facility] presenting with three weeks of progressive swelling in his legs, face and arms with mild decrease in urination. Denies dysuria or hematuria. He follows with [facility] since his kidney transplant and states last appointment in Sept his medications were increased 2/2 proteinuria. Endorses 20lb weight gain over the past three weeks. Denies recent illness, endorses SOB, mild productive cough with yellow sputum, denies chest pain, changes in BM. A&Ox3 and without confusion. Endorses decreased appetite the past week.   Coreg 12.5 mg bid recently added to medications in addition to regular losartan and norvasc. Patient brought medications but losartan not in bag, unclear if he is taking this medication.   The history is provided by the patient.    Past Medical History:  Diagnosis Date  . Anemia    ESRD  . CHF (congestive heart failure) ([place])   . Chronic kidney disease 06/07/2011   Hemodialysis-TTS Last hemo today 05/08/2011  . Diabetes mellitus without complication ([place])   . GERD (gastroesophageal reflux disease)   . Hypertension 111/29/2012   pt states he takes no HTN meds  . Hypothyroidism, secondary   . Morbid obesity ([place])   . Paroxysmal atrial fibrillation (HCC)   . Sleep apnea 06/07/2011   Pt had sllep study done 2 weeks ago- Dr. Joellyn Quails.  Does not  have a CPAP at this time.    Patient Active Problem List   Diagnosis Date Noted  . Type 2 diabetes mellitus without complication, without long-term current use of insulin ([place]) 01/25/2018  . Essential hypertension 01/25/2017  . Well adult exam 01/17/2016  . Morbid obesity ([place]) 01/17/2016  .  Delayed graft function of kidney transplant due to reason other than ATN or rejection requiring acute dialysis ([place]) 11/02/2012  . Immunosuppression ([place]) 11/02/2012  . Kidney replaced by transplant 11/02/2012  . OSA (obstructive sleep apnea) 05/26/2011    Past Surgical History:  Procedure Laterality Date  . ARTERIOVENOUS GRAFT PLACEMENT  08/09/10   Left Thigh Graft by Dr. [name]  . CAPD REMOVAL  05/08/2011   Procedure: CONTINUOUS AMBULATORY PERITONEAL DIALYSIS  (CAPD) CATHETER REMOVAL;  Surgeon: [name], MD;  Location: [facility] OR;  Service: General;  Laterality: N/A;  Removal of CAPD catheter, Dr. requests to go after 100  . INSERTION OF DIALYSIS CATHETER  10/05/10   Right Femoral Cath insertion by Dr. [name].  Pt ahas had several caths inserted.  Marland Kitchen KIDNEY TRANSPLANT  2014        Home Medications    Prior to Admission medications   Medication Sig Start Date End Date Taking? Authorizing Provider  acetaminophen (TYLENOL) 500 MG tablet Take 1,000 mg by mouth every 6 (six) hours as needed for mild pain or headache.   Yes [provider]  amLODipine (NORVASC) 10 MG tablet  12/29/17  Yes [provider]  aspirin EC 81 MG tablet Take 81 mg by mouth daily.   Yes [provider]  carvedilol (COREG) 25 MG tablet Take 12.5 mg by mouth 2 (two) times daily. 04/01/18 04/01/19 Yes [provider]  Cholecalciferol (VITAMIN D3) 2000 units capsule Take by mouth.  Yes [provider]  mycophenolate (CELLCEPT) 500 MG tablet Take 1,000 mg by mouth 2 (two) times daily.  12/23/17  Yes [provider]  Omega-3 Fatty Acids (FISH OIL) 1000 MG CAPS Take 1,000 mg by mouth daily.   Yes [provider]  predniSONE (DELTASONE) 5 MG tablet Take 5 mg by mouth daily with breakfast.  06/22/17  Yes [provider]  tacrolimus (PROGRAF) 1 MG capsule Take 2-3 mg by mouth See admin instructions. Take three capsules (3mg  total) at 9 AM  and two capsules (2mg  total) at 9 PM 12/23/17  Yes [provider]  atorvastatin (LIPITOR) 20 MG tablet Take 1 tablet (20 mg total) by mouth daily. Patient not taking: Reported on 05/10/2018 01/29/18   Luetta Nutting, DO    Family History Family History  Problem Relation Age of Onset  . Diabetes Father   . Stroke Father   . Anesthesia problems Neg Hx     Social History Social History   Tobacco Use  . Smoking status: Never Smoker  . Smokeless tobacco: Never Used  Substance Use Topics  . Alcohol use: No  . Drug use: Yes    Frequency: 5.0 times per week    Types: Marijuana     Allergies   Patient has no known allergies.   Review of Systems Review of Systems  Constitutional: Positive for unexpected weight change. Negative for diaphoresis and fever.  HENT: Positive for congestion and facial swelling. Negative for sinus pressure, sinus pain, sneezing and sore throat.   Eyes: Negative for visual disturbance.  Respiratory: Positive for cough and shortness of breath. Negative for chest tightness.   Cardiovascular: Positive for leg swelling. Negative for chest pain.  Gastrointestinal: Positive for constipation. Negative for abdominal distention, abdominal pain, blood in stool and nausea.  Genitourinary: Positive for decreased urine volume. Negative for dysuria, hematuria and urgency.  Neurological: Negative for dizziness and weakness.  Psychiatric/Behavioral: Negative for agitation and confusion.  Ten systems reviewed and are negative for acute change, except as noted in the HPI.    Physical Exam Updated Vital Signs BP (!) 185/94   Pulse 87   Temp 98 F (36.7 C) (Oral)   Resp (!) 7   SpO2 100%   Physical Exam  Constitutional: He is oriented to person, place, and time. He is sleeping. He is easily aroused. No distress.  Eyes: EOM are normal.  Neck: Full passive range of motion without pain. No hepatojugular reflux and no JVD present.  Cardiovascular: Normal rate,  regular rhythm and intact distal pulses. Exam reveals distant heart sounds. Exam reveals no gallop and no friction rub.  No murmur heard. Pulmonary/Chest: Effort normal. Tachypnea noted. No respiratory distress. He has wheezes. He has no rhonchi. He has no rales.  Abdominal: Soft. Bowel sounds are normal. He exhibits no distension. There is no tenderness.  Musculoskeletal: Normal range of motion. He exhibits edema (+1 pitting edema LE b/l).  Neurological: He is alert, oriented to person, place, and time and easily aroused. He is not disoriented.  Skin: Skin is warm and dry. He is not diaphoretic. No erythema.     ED Treatments / Results  Labs (all labs ordered are listed, but only abnormal results are displayed) Labs Reviewed  CBC WITH DIFFERENTIAL/PLATELET - Abnormal; Notable for the following components:      Result Value   RBC 3.21 (*)    Hemoglobin 8.1 (*)    HCT 26.6 (*)    MCH 25.2 (*)  Platelets 141 (*)    Lymphs Abs 0.4 (*)    Abs Immature Granulocytes 0.10 (*)    All other components within normal limits  COMPREHENSIVE METABOLIC PANEL - Abnormal; Notable for the following components:   CO2 16 (*)    Glucose, Bld 126 (*)    BUN 128 (*)    Creatinine, Ser 18.64 (*)    Calcium 7.5 (*)    Total Protein 5.8 (*)    Albumin 2.5 (*)    AST 10 (*)    GFR calc non Af Amer 3 (*)    GFR calc Af Amer 3 (*)    Anion gap 21 (*)    All other components within normal limits  URINALYSIS, ROUTINE W REFLEX MICROSCOPIC    EKG EKG Interpretation  Date/Time:  Monday May 10 2018 14:40:52 EST Ventricular Rate:  87 PR Interval:    QRS Duration: 91 QT Interval:  362 QTC Calculation: 436 R Axis:   -10 Text Interpretation:  Sinus rhythm Low voltage limb leads Confirmed by Lajean Saver [phone]) on 05/10/2018 3:13:56 PM   Radiology Dg Chest 2 View  Result Date: 05/10/2018 CLINICAL DATA:  Generalized swelling. EXAM: CHEST - 2 VIEW COMPARISON:  December 13, 2010 FINDINGS: The heart  size and mediastinal contours are within normal limits. Both lungs are clear. The visualized skeletal structures are unremarkable. IMPRESSION: No active cardiopulmonary disease. Electronically Signed   By: Dorise Bullion III M.D   On: 05/10/2018 16:16    Procedures Procedures (including critical care time)  Medications Ordered in ED Medications - No data to display   Initial Impression / Assessment and Plan / ED Course  I have reviewed the triage vital signs and the nursing notes.  Pertinent labs & imaging results that were available during my care of the patient were reviewed by me and considered in my medical decision making (see chart for details).  Clinical Course as of May 11 1647  Mon May 10, 2018  1645 Recently started on coreg 12.5 mg bid per care everywhere with notes citing likely advancement to ESRD. Creatinine elevated to 18.6 from 4 on notes 04/01/18. GFR 3, BUN 128 and acidosis with positive anion gap. He is alert and oriented and potassium is not elevated. Hemoglobin also decreased from normal ~12 to 8.1, likely secondary to his CKD. No changes in bowel movement except for mild constipation the past couple of days or gross blood in stool.  Hypertensive urgency as well. Unclear if he has been taking his losartan as this medication was not included in the meds he brought to day and has stopped his medications previously due to being unsure what he should be taking.  No urgent dialysis at this time but will place call for unassigned admission.    [JS]    Clinical Course User Index [JS] Yulisa Chirico A, DO      Final Clinical Impressions(s) / ED Diagnoses   Final diagnoses:  Acute renal failure superimposed on stage 5 chronic kidney disease, not on chronic dialysis, unspecified acute renal failure type [facility])  Peripheral edema  Symptomatic anemia    ED Discharge Orders    None       Molli Hazard A, DO 05/10/18 1650    Lajean Saver, MD 05/11/18 [phone]

## 2018-05-11 ENCOUNTER — Other Ambulatory Visit (HOSPITAL_COMMUNITY): Payer: 59

## 2018-05-11 ENCOUNTER — Encounter (HOSPITAL_COMMUNITY): Payer: Self-pay | Admitting: Interventional Radiology

## 2018-05-11 ENCOUNTER — Ambulatory Visit (HOSPITAL_COMMUNITY): Payer: 59

## 2018-05-11 ENCOUNTER — Inpatient Hospital Stay (HOSPITAL_COMMUNITY): Payer: 59

## 2018-05-11 ENCOUNTER — Other Ambulatory Visit: Payer: Self-pay

## 2018-05-11 HISTORY — PX: IR US GUIDE VASC ACCESS RIGHT: IMG2390

## 2018-05-11 HISTORY — PX: IR FLUORO GUIDE CV LINE RIGHT: IMG2283

## 2018-05-11 LAB — COMPREHENSIVE METABOLIC PANEL
ALT: 11 U/L (ref 0–44)
AST: 8 U/L — ABNORMAL LOW (ref 15–41)
Albumin: 2.7 g/dL — ABNORMAL LOW (ref 3.5–5.0)
Alkaline Phosphatase: 36 U/L — ABNORMAL LOW (ref 38–126)
Anion gap: 20 — ABNORMAL HIGH (ref 5–15)
BUN: 135 mg/dL — ABNORMAL HIGH (ref 6–20)
CO2: 14 mmol/L — ABNORMAL LOW (ref 22–32)
Calcium: 7.5 mg/dL — ABNORMAL LOW (ref 8.9–10.3)
Chloride: 106 mmol/L (ref 98–111)
Creatinine, Ser: 18.58 mg/dL — ABNORMAL HIGH (ref 0.61–1.24)
GFR calc Af Amer: 3 mL/min — ABNORMAL LOW (ref >60)
GFR calc non Af Amer: 3 mL/min — ABNORMAL LOW (ref >60)
Glucose, Bld: 133 mg/dL — ABNORMAL HIGH (ref 70–99)
Potassium: 4.1 mmol/L (ref 3.5–5.1)
Sodium: 140 mmol/L (ref 135–145)
Total Bilirubin: 0.6 mg/dL (ref 0.3–1.2)
Total Protein: 5.6 g/dL — ABNORMAL LOW (ref 6.5–8.1)

## 2018-05-11 LAB — CBC
HCT: 27.8 % — ABNORMAL LOW (ref 39.0–52.0)
Hemoglobin: 8.4 g/dL — ABNORMAL LOW (ref 13.0–17.0)
MCH: 25 pg — ABNORMAL LOW (ref 26.0–34.0)
MCHC: 30.2 g/dL (ref 30.0–36.0)
MCV: 82.7 fL (ref 80.0–100.0)
NRBC: 0 % (ref 0.0–0.2)
Platelets: 167 10*3/uL (ref 150–400)
RBC: 3.36 MIL/uL — ABNORMAL LOW (ref 4.22–5.81)
RDW: 14.9 % (ref 11.5–15.5)
WBC: 8.6 10*3/uL (ref 4.0–10.5)

## 2018-05-11 LAB — MRSA PCR SCREENING: MRSA by PCR: NEGATIVE

## 2018-05-11 LAB — CBG MONITORING, ED
GLUCOSE-CAPILLARY: 86 mg/dL (ref 70–99)
GLUCOSE-CAPILLARY: 84 mg/dL (ref 70–99)

## 2018-05-11 LAB — POC OCCULT BLOOD, ED: FECAL OCCULT BLD: NEGATIVE

## 2018-05-11 LAB — URINE CULTURE: Culture: NO GROWTH

## 2018-05-11 LAB — PROTIME-INR
INR: 1.21
Prothrombin Time: 15.2 seconds (ref 11.4–15.2)

## 2018-05-11 LAB — GLUCOSE, CAPILLARY
Glucose-Capillary: 85 mg/dL (ref 70–99)
Glucose-Capillary: 81 mg/dL (ref 70–99)

## 2018-05-11 LAB — TSH: TSH: 2.619 u[IU]/mL (ref 0.350–4.500)

## 2018-05-11 LAB — IRON AND TIBC
Iron: 108 ug/dL (ref 45–182)
Saturation Ratios: 59 % — ABNORMAL HIGH (ref 17.9–39.5)
TIBC: 183 ug/dL — ABNORMAL LOW (ref 250–450)
UIBC: 75 ug/dL

## 2018-05-11 LAB — FERRITIN: Ferritin: 1172 ng/mL — ABNORMAL HIGH (ref 24–336)

## 2018-05-11 LAB — PHOSPHORUS: PHOSPHORUS: 11.2 mg/dL — AB (ref 2.5–4.6)

## 2018-05-11 LAB — HEMOGLOBIN A1C
HEMOGLOBIN A1C: 5.8 % — AB (ref 4.8–5.6)
Mean Plasma Glucose: 119.76 mg/dL

## 2018-05-11 LAB — HIV ANTIBODY (ROUTINE TESTING W REFLEX): HIV Screen 4th Generation wRfx: NONREACTIVE

## 2018-05-11 MED ORDER — ACETAMINOPHEN 325 MG PO TABS
650.0000 mg | ORAL_TABLET | Freq: Four times a day (QID) | ORAL | Status: DC | PRN
  Administered 2018-05-11 – 2018-05-19 (×5): 650 mg via ORAL
  Filled 2018-05-11 (×5): qty 2

## 2018-05-11 MED ORDER — TACROLIMUS 1 MG PO CAPS
3.0000 mg | ORAL_CAPSULE | ORAL | Status: DC
  Administered 2018-05-11 – 2018-05-12 (×2): 3 mg via ORAL
  Filled 2018-05-11 (×2): qty 3

## 2018-05-11 MED ORDER — SODIUM CHLORIDE 0.9% FLUSH
3.0000 mL | Freq: Two times a day (BID) | INTRAVENOUS | Status: DC
  Administered 2018-05-11 – 2018-05-20 (×14): 3 mL via INTRAVENOUS

## 2018-05-11 MED ORDER — FENTANYL CITRATE (PF) 100 MCG/2ML IJ SOLN
INTRAMUSCULAR | Status: AC | PRN
  Administered 2018-05-11: 25 ug via INTRAVENOUS
  Administered 2018-05-11: 50 ug via INTRAVENOUS
  Administered 2018-05-11: 25 ug via INTRAVENOUS

## 2018-05-11 MED ORDER — HYDRALAZINE HCL 50 MG PO TABS
50.0000 mg | ORAL_TABLET | Freq: Three times a day (TID) | ORAL | Status: DC
  Administered 2018-05-11 (×2): 50 mg via ORAL
  Filled 2018-05-11 (×2): qty 2

## 2018-05-11 MED ORDER — LIDOCAINE HCL 1 % IJ SOLN
INTRAMUSCULAR | Status: AC | PRN
  Administered 2018-05-11: 10 mL

## 2018-05-11 MED ORDER — OMEGA-3-ACID ETHYL ESTERS 1 G PO CAPS
1.0000 g | ORAL_CAPSULE | Freq: Every day | ORAL | Status: DC
  Administered 2018-05-11 – 2018-05-21 (×10): 1 g via ORAL
  Filled 2018-05-11 (×11): qty 1

## 2018-05-11 MED ORDER — SODIUM CHLORIDE 0.9% FLUSH: 3.0000 mL | INTRAVENOUS | Status: DC | PRN

## 2018-05-11 MED ORDER — PREDNISONE 5 MG PO TABS
5.0000 mg | ORAL_TABLET | Freq: Every day | ORAL | Status: DC
  Administered 2018-05-11 – 2018-05-21 (×11): 5 mg via ORAL
  Filled 2018-05-11 (×11): qty 1

## 2018-05-11 MED ORDER — ASPIRIN EC 81 MG PO TBEC
81.0000 mg | DELAYED_RELEASE_TABLET | Freq: Every day | ORAL | Status: DC
  Administered 2018-05-11 – 2018-05-21 (×10): 81 mg via ORAL
  Filled 2018-05-11 (×11): qty 1

## 2018-05-11 MED ORDER — ATORVASTATIN CALCIUM 10 MG PO TABS
20.0000 mg | ORAL_TABLET | Freq: Every day | ORAL | Status: DC
  Administered 2018-05-11 – 2018-05-21 (×10): 20 mg via ORAL
  Filled 2018-05-11 (×3): qty 2
  Filled 2018-05-11: qty 1
  Filled 2018-05-11 (×7): qty 2

## 2018-05-11 MED ORDER — CEFAZOLIN SODIUM-DEXTROSE 1-4 GM/50ML-% IV SOLN: 1.0000 g | Freq: Once | INTRAVENOUS | Status: DC

## 2018-05-11 MED ORDER — LIDOCAINE HCL 1 % IJ SOLN
INTRAMUSCULAR | Status: AC
  Filled 2018-05-11: qty 20

## 2018-05-11 MED ORDER — HEPARIN SODIUM (PORCINE) 1000 UNIT/ML IJ SOLN
INTRAMUSCULAR | Status: AC
  Filled 2018-05-11: qty 1

## 2018-05-11 MED ORDER — HYDRALAZINE HCL 50 MG PO TABS
75.0000 mg | ORAL_TABLET | Freq: Three times a day (TID) | ORAL | Status: DC
  Administered 2018-05-11: 75 mg via ORAL
  Filled 2018-05-11 (×2): qty 1

## 2018-05-11 MED ORDER — ACETAMINOPHEN 650 MG RE SUPP: 650.0000 mg | Freq: Four times a day (QID) | RECTAL | Status: DC | PRN

## 2018-05-11 MED ORDER — ONDANSETRON HCL 4 MG/2ML IJ SOLN
4.0000 mg | Freq: Four times a day (QID) | INTRAMUSCULAR | Status: DC | PRN
  Administered 2018-05-12 (×2): 4 mg via INTRAVENOUS
  Filled 2018-05-11 (×2): qty 2

## 2018-05-11 MED ORDER — SODIUM CHLORIDE 0.9 % IV SOLN: 250.0000 mL | INTRAVENOUS | Status: DC | PRN

## 2018-05-11 MED ORDER — POLYETHYLENE GLYCOL 3350 17 G PO PACK: 17.0000 g | PACK | Freq: Every day | ORAL | Status: DC | PRN

## 2018-05-11 MED ORDER — CEFAZOLIN SODIUM-DEXTROSE 1-4 GM/50ML-% IV SOLN
1.0000 g | INTRAVENOUS | Status: AC
  Administered 2018-05-11: 1 g via INTRAVENOUS
  Filled 2018-05-11: qty 50

## 2018-05-11 MED ORDER — MIDAZOLAM HCL 2 MG/2ML IJ SOLN
INTRAMUSCULAR | Status: AC | PRN
  Administered 2018-05-11 (×3): 0.5 mg via INTRAVENOUS

## 2018-05-11 MED ORDER — MYCOPHENOLATE MOFETIL 250 MG PO CAPS
1000.0000 mg | ORAL_CAPSULE | Freq: Two times a day (BID) | ORAL | Status: DC
  Administered 2018-05-11 – 2018-05-21 (×22): 1000 mg via ORAL
  Filled 2018-05-11 (×26): qty 4

## 2018-05-11 MED ORDER — HEPARIN SODIUM (PORCINE) 5000 UNIT/ML IJ SOLN
5000.0000 [IU] | Freq: Three times a day (TID) | INTRAMUSCULAR | Status: DC
  Administered 2018-05-11: 5000 [IU] via SUBCUTANEOUS
  Filled 2018-05-11: qty 1

## 2018-05-11 MED ORDER — AMLODIPINE BESYLATE 10 MG PO TABS
10.0000 mg | ORAL_TABLET | Freq: Every day | ORAL | Status: DC
  Administered 2018-05-11 – 2018-05-14 (×4): 10 mg via ORAL
  Filled 2018-05-11 (×4): qty 1
  Filled 2018-05-11: qty 2

## 2018-05-11 MED ORDER — ORAL CARE MOUTH RINSE
15.0000 mL | Freq: Two times a day (BID) | OROMUCOSAL | Status: DC
  Administered 2018-05-12 – 2018-05-19 (×7): 15 mL via OROMUCOSAL

## 2018-05-11 MED ORDER — CHLORHEXIDINE GLUCONATE 0.12 % MT SOLN
15.0000 mL | Freq: Two times a day (BID) | OROMUCOSAL | Status: DC
  Administered 2018-05-12 – 2018-05-21 (×17): 15 mL via OROMUCOSAL
  Filled 2018-05-11 (×19): qty 15

## 2018-05-11 MED ORDER — CARVEDILOL 12.5 MG PO TABS
12.5000 mg | ORAL_TABLET | Freq: Two times a day (BID) | ORAL | Status: DC
  Administered 2018-05-11 – 2018-05-12 (×4): 12.5 mg via ORAL
  Filled 2018-05-11 (×4): qty 1

## 2018-05-11 MED ORDER — FENTANYL CITRATE (PF) 100 MCG/2ML IJ SOLN
INTRAMUSCULAR | Status: AC
  Filled 2018-05-11: qty 2

## 2018-05-11 MED ORDER — SODIUM CHLORIDE 0.9% FLUSH
3.0000 mL | Freq: Two times a day (BID) | INTRAVENOUS | Status: DC
  Administered 2018-05-11 – 2018-05-20 (×13): 3 mL via INTRAVENOUS

## 2018-05-11 MED ORDER — SORBITOL 70 % SOLN
30.0000 mL | Freq: Every day | Status: DC | PRN
  Filled 2018-05-11: qty 30

## 2018-05-11 MED ORDER — TACROLIMUS 1 MG PO CAPS
2.0000 mg | ORAL_CAPSULE | ORAL | Status: DC
  Administered 2018-05-11: 2 mg via ORAL
  Filled 2018-05-11: qty 2

## 2018-05-11 MED ORDER — MIDAZOLAM HCL 2 MG/2ML IJ SOLN
INTRAMUSCULAR | Status: AC
  Filled 2018-05-11: qty 2

## 2018-05-11 MED ORDER — HEPARIN SODIUM (PORCINE) 5000 UNIT/ML IJ SOLN
5000.0000 [IU] | Freq: Three times a day (TID) | INTRAMUSCULAR | Status: DC
  Administered 2018-05-12 – 2018-05-21 (×24): 5000 [IU] via SUBCUTANEOUS
  Filled 2018-05-11 (×24): qty 1

## 2018-05-11 MED ORDER — ONDANSETRON HCL 4 MG PO TABS: 4.0000 mg | ORAL_TABLET | Freq: Four times a day (QID) | ORAL | Status: DC | PRN

## 2018-05-11 NOTE — ED Notes (Signed)
Pt c/obackpain due to stretcher. Regular hospital bed ordered.

## 2018-05-11 NOTE — ED Notes (Signed)
Post void residual completed, 23 ml resulted.

## 2018-05-11 NOTE — ED Notes (Signed)
Per pharmacy the hospital is on back order for Cardene from the manufacturer, it will need to be replaced with Cleviprex. Spoke with NP Schoor who said titrate down with remaining bag and if the patient's BP remains within parameters she will d/c the Cardene.

## 2018-05-11 NOTE — ED Notes (Signed)
Dr. Grandville Silos contacted re pt bp and states to give 1000 am norvasc and coreg along with now ordered hydralazine

## 2018-05-11 NOTE — ED Notes (Signed)
IV team at bedside 

## 2018-05-11 NOTE — ED Notes (Addendum)
Patient states "I should just go home and then come back in the morning for the procedure. I can sleep better at home and I can deal with my BP there." Explained delay to patient and that we have requested a hospital bed but are waiting for it to be delivered.

## 2018-05-11 NOTE — Progress Notes (Signed)
Shift event note:  Notified by "Katie", RN that pt's BP has remained within parameters while titrating Cardene drip down. Hospital is currently on back order for Cardene drip. Will d/c Cardene drip for now and monitor BP's closely. If BP's start to trend up and drip needs to be restarted will need to use Cleviprex Drip as Cardene not currently available. Rounding MD can re-evaluate pt and BP's this am to decide if current LOC still required. Will continue to monitor closely at current SDU level of care.   Jeryl [place], NP-C [facility] Pager [phone]

## 2018-05-11 NOTE — Progress Notes (Signed)
[name] is a 42 y.o. male patient admitted from ED awake, alert - oriented  X 4 - no acute distress noted.   Orientation to room, and floor completed with information packet given to patient/family.  Patient declined safety video at this time.  Admission INP armband ID verified with patient/family, and in place.   SR up x 2, fall assessment complete, with patient and family able to verbalize understanding of risk associated with falls, and verbalized understanding to call nsg before up out of bed.  Call light within reach, patient able to voice, and demonstrate understanding. Skin, clean-dry- intact without evidence of bruising, or skin tears.   No evidence of skin break down noted on exam.     Will cont to eval and treat per MD orders.  Tama High, RN 05/11/2018 5:11 PM

## 2018-05-11 NOTE — ED Notes (Signed)
Patient sitting on the side of the bed for comfort at this time.

## 2018-05-11 NOTE — Progress Notes (Signed)
When going in to assess pt I noticed blood on sheet coming from femoral hemodialysis catheter. Moderate blood found with clots.I notified charge nurse and we applied gauze dressing to it to monitor bleeding. Internal radiology was called and they stated they were in a code and to contact Sandi Mariscal the Radiologist who placed catheter. When called office was closed. I then contacted the Nephrologist who stated that I need IR to come and look at the site. Will continue to monitor and try and contact IR.

## 2018-05-11 NOTE — Progress Notes (Signed)
Rt. Groin HDC.  Moderate amount of blood on dressing.  Oozing blood at site 2x2 gauze with transparent dressing applied.  4x4 pressure drsg applied on top of this dressing. Will continue to monitor

## 2018-05-11 NOTE — ED Notes (Signed)
Bladder scan completed, 416 ml resulted.

## 2018-05-11 NOTE — ED Notes (Signed)
Pt sleeping at intervalswith intermittent apnea.Satrebound with rousing

## 2018-05-11 NOTE — ED Notes (Signed)
Report Attempted

## 2018-05-11 NOTE — Procedures (Signed)
Echo attempted. Patient not in room.

## 2018-05-11 NOTE — Progress Notes (Signed)
   [facility] Progress Note   Assessment/ Plan:   1.  AKI on CKD4 with a creatinine that was already 4.8 in 03/03/2018 with inadequate tissue from a tx renal bx 11/2017. 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 Likely has progressed to ESRD. -  Plan on Tunneled HD cath tomorrow and initiate HD. - Will consult [facility] for access placement- history of multiple failed accesses. - Will also check a tac trough level; low on diff. - C3C4 were normal at [facility]. - Urine studies requested and are pending. - Check bladder scan to r/o obstruction--> PVR 23 -  Have contacted [facility] transplant- ? Would re-biopsy be beneficial 2. DDRT with h/o DGF @ [facility]- prednisone 5 mg daily, MMF 1000 BID, tac 2 BID 3. Hypertensive urgency/emergency- pressures better on hom meds 4. DM 5. Secondary HPT -  Check PTH and phos to manage renal osteodystrophy; likely will need binders. 6. Chronic Anemia -  Check iron panels; start ESA. 7. OSA  Subjective:    No complaints.  Reports he has multiple failed accesses and was on Coumadin to keep graft patency.  For [facility] today and first dialysis.   Objective:   BP (!) 173/108   Pulse 84   Temp (!) 96.6 F (35.9 C) (Rectal)   Resp (!) 21   SpO2 99%   Physical Exam: Gen: obese, NAD, on gurney CVS: RRR Resp: fine crackles at bases Abd: obese Ext: 2+ LE edema  Labs: BMET Recent Labs  Lab 05/10/18 1521 05/10/18 2119 05/11/18 0344  NA 138 139 140  K 4.0 4.4 4.1  CL 101 105 106  CO2 16* 15* 14*  GLUCOSE 126* 102* 133*  BUN 128* 126* 135*  CREATININE 18.64* 17.81* 18.58*  CALCIUM 7.5* 7.3* 7.5*  PHOS  --  >12.0* 11.2*   CBC Recent Labs  Lab 05/10/18 1521 05/10/18 2119 05/11/18 0344  WBC 7.8 7.8 8.6  NEUTROABS 6.4  --   --   HGB 8.1* 8.7* 8.4*  HCT 26.6* 28.2* 27.8*  MCV 82.9 82.7 82.7  PLT 141* 168 167    @IMGRELPRIORS @ Medications:    . amLODipine  10 mg Oral Daily  . aspirin EC  81 mg Oral Daily  .  atorvastatin  20 mg Oral Daily  . carvedilol  12.5 mg Oral BID  . Chlorhexidine Gluconate Cloth  6 each Topical Q0600  . hydrALAZINE  50 mg Oral Q8H  . insulin aspart  0-9 Units Subcutaneous TID WC  . mycophenolate  1,000 mg Oral BID  . omega-3 acid ethyl esters  1 g Oral Daily  . predniSONE  5 mg Oral Q breakfast  . sodium chloride flush  3 mL Intravenous Q12H  . sodium chloride flush  3 mL Intravenous Q12H  . tacrolimus  2 mg Oral Q24H  . tacrolimus  3 mg Oral Q24H     Madelon Lips, MD 05/11/2018, 1:52 PM

## 2018-05-11 NOTE — Progress Notes (Signed)
PROGRESS NOTE    [name]  LZJ:[id number] DOB: [birth year] DOA: 05/10/2018 PCP: Luetta Nutting, DO    Brief Narrative:  Patient is a 42 year old gentleman history of diabetes, hypertension, DD RT from [facility] in 2014 after having been on HD short-term PD x7 years initially followed by [facility], history of obstructive sleep apnea, poorly controlled hypertension, morbid obesity who has had progressive worsening of renal function with last creatinine noted at 4.8 on 03/03/2018 at [facility].  Patient with history of transplant renal biopsy June 2019 specimen deemed inadequate.  Patient presented to the ED with facial swelling and increasing lower extremity swelling over the past week with a 20 pound weight gain.  Patient seen in the ED with hypertensive urgency/emergency, acute on chronic kidney disease stage IV with creatinine of 18.64.  Patient placed on high-dose IV Lasix, oral antihypertensive medications, nicardipine drip.  Nephrology consulted and patient for tunneled HD catheter in anticipation of hemodialysis.     Assessment & Plan:   Principal Problem:   Acute kidney injury superimposed on CKD ([place]) Active Problems:   Volume overload   Hypertensive emergency   Hypertensive urgency   OSA (obstructive sleep apnea)   Essential hypertension   Immunosuppression ([place])   Morbid obesity ([place])   Kidney replaced by transplant   Type 2 diabetes mellitus without complication, without long-term current use of insulin ([facility])   Acidosis   Anemia  1 acute renal failure on chronic kidney disease stage IV/status post kidney transplant versus progressive worsening kidney disease status post transplant Patient with prior history of end-stage renal disease on hemodialysis status post kidney transplant in Oct 31, 2012.  Baseline creatinine used to be 1.15 January 2017.  Last creatinine of 4.8 on 03/03/2018 with inadequate tissue from renal biopsy 11/2017.  Nephrology strong suspicion for  secondary FSGS from obesity and patient may have likely progressed to end-stage renal disease. Per care everywhere patient noted to have a creatinine of 2.8 in February 2019.  Last creatinine was 3.77 on 01/25/2018.  Patient presenting with volume overload, lower extremity edema, facial swelling, some shortness of breath on exertion.  Crackles noted on physical exam.  Creatinine on admission is 18.64.  Patient also noted to be acidotic with a bicarb of 16 and a BUN of 128.  Patient admitted to stepdown unit.   Creatinine of 18.58 this morning, with worsening bicarb of 14, phosphorus level elevated at 11.2, BUN worsening at 135.  This with 150 of glucose, greater than 312 proteinuria, trace leukocytes, nitrite negative.  Urine sodium of 49.  Urine creatinine of 79.94.  Tacrolimus level pending.  Ultrasound renal transplant with Dopplers with right lower quadrant renal transplant, no hydronephrosis, no acute findings.  7.1 cm complex fluid collection in the right lower quadrant adjacent to the lower pole of the transplanted kidney which is decreased in size when compared to outside report from [facility] ultrasound performed 02/23/2013.  Continue Lasix 120 mg IV every 8 hours.  Strict I's and O's.  Daily weights.  Continue CellCept, prednisone, tacrolimus.  Patient has been seen in consultation by nephrology and patient for tunneled HD cath in anticipation for hemodialysis today.  Nephrology following and appreciate input and recommendations.    2.  Volume overload Patient noted to be volume overloaded on examination on presentation.  Patient presented with a one-week history of facial swelling, lower extremity edema and a 20 pound weight gain.  Patient with worsening renal function.  Physical exam patient noted to have  some diffuse crackles.  EKG with no ischemic changes noted.  2D echo pending.  Continue Lasix 120 mg IV every 8 hours.  Strict I's and O's.  Daily weights.  Patient for HD tunneled catheter today in  anticipation of hemodialysis.  Nephrology following.   3.  Obstructive sleep apnea CPAP nightly.  4.  Hypertensive emergency/urgency Patient noted to have systolic blood pressure of 221/117 on admission.  Hydralazine 10 mg IV x1.  Patient is on Lasix 120 mg IV every 8 hours with not that significant urine output.  Urine output not recorded.  Patient had to be placed on nicardipine drip overnight and drip has finished and per RN no further nicardipine in house.  Continue Lasix 120 mg IV every 8 hours.  Continue home regimen of Norvasc Coreg.  We will start hydralazine 50 mg p.o. every 8 hours and uptitrate as needed for better blood pressure control.  It is noted that patient in the past has had systolic blood pressures in the 170s to 180s which may be his baseline.  Patient will need better blood pressure control and management.  Hydralazine as needed.  Follow.   5.  Anemia Patient with no overt bleeding.  Likely anemia of chronic disease.    Anemia panel consistent with anemia of chronic disease.  Hemoglobin currently at 8.4 and seems stable from admission.  Transfusion threshold hemoglobin less than 7.  May need Aranesp.  Per nephrology.   6.  Acidosis Secondary to problem #1.  Patient to have tunneled HD catheter placed today in anticipation of starting hemodialysis today.  Per nephrology.  7.    Well controlled diabetes mellitus 2 Hemoglobin A1c at 5.8.  CBG of 86 this morning.  Continue to hold home oral hypoglycemic agents.  Continue sliding scale insulin.   8.  Hyperlipidemia Check a fasting lipid panel.    Continue home regimen statin.  9.  Morbid obesity    DVT prophylaxis: Heparin Code Status: Full Family Communication: Updated patient.  No family at bedside. Disposition Plan: Likely home when medically stable clinically improved and per nephrology.   Consultants:   Nephrology: Dr. Augustin Coupe 05/10/2018  Vascular interventional radiology Candiss Norse, PA  05/11/2018  Procedures:   Ultrasound of transplanted kidney 05/10/2018 Chest x-ray 05/10/2018    Antimicrobials:   None   Subjective: Patient sleeping on his side snoring.  Easily arousable.  Denies any chest pain.  Unable to assess shortness of breath as he states he has not been able to ambulate.  States did not have significant urine output despite high-dose IV Lasix.  Per RN patient was on nicardipine drip overnight however drip is finished and per RN no further nicardipine in house.  Objective: Vitals:   05/11/18 1555 05/11/18 1600 05/11/18 1605 05/11/18 1611  BP: (!) 205/102 (!) 187/108 (!) 192/98 (!) 180/102  Pulse: 86 83 84 84  Resp: 15 18 17 12   Temp:      TempSrc:      SpO2: 99% 100% 99% 97%    Intake/Output Summary (Last 24 hours) at 05/11/2018 1709 Last data filed at 05/11/2018 1403 Gross per 24 hour  Intake -  Output 950 ml  Net -950 ml   There were no vitals filed for this visit.  Examination:  General exam: Snoring. Respiratory system: Bibasilar crackles right greater than left.  No wheezing.  No rhonchi.  Normal respiratory effort.  Cardiovascular system: S1 & S2 heard, RRR.  Thick neck unable to assess JVD.  Distant  heart sounds secondary to body habitus.  No murmurs rubs or gallops.  Trace bilateral lower extremity edema.  Gastrointestinal system: Abdomen is obese, soft, nontender, nondistended, positive bowel sounds.  No rebound.  No guarding.  Central nervous system: Alert and oriented. No focal neurological deficits. Extremities: Symmetric 5 x 5 power. Skin: No rashes, lesions or ulcers Psychiatry: Judgement and insight appear normal. Mood & affect appropriate.     Data Reviewed: I have personally reviewed following labs and imaging studies  CBC: Recent Labs  Lab 05/10/18 1521 05/10/18 2119 05/11/18 0344  WBC 7.8 7.8 8.6  NEUTROABS 6.4  --   --   HGB 8.1* 8.7* 8.4*  HCT 26.6* 28.2* 27.8*  MCV 82.9 82.7 82.7  PLT 141* 168 440   Basic  Metabolic Panel: Recent Labs  Lab 05/10/18 1521 05/10/18 2119 05/11/18 0344  NA 138 139 140  K 4.0 4.4 4.1  CL 101 105 106  CO2 16* 15* 14*  GLUCOSE 126* 102* 133*  BUN 128* 126* 135*  CREATININE 18.64* 17.81* 18.58*  CALCIUM 7.5* 7.3* 7.5*  PHOS  --  >12.0* 11.2*   GFR: CrCl cannot be calculated (Unknown ideal weight.). Liver Function Tests: Recent Labs  Lab 05/10/18 1521 05/10/18 2119 05/11/18 0344  AST 10*  --  8*  ALT 12  --  11  ALKPHOS 38  --  36*  BILITOT 0.6  --  0.6  PROT 5.8*  --  5.6*  ALBUMIN 2.5* 2.5* 2.7*   No results for input(s): LIPASE, AMYLASE in the last 168 hours. No results for input(s): AMMONIA in the last 168 hours. Coagulation Profile: Recent Labs  Lab 05/10/18 2119 05/11/18 0344  INR 1.16 1.21   Cardiac Enzymes: No results for input(s): CKTOTAL, CKMB, CKMBINDEX, TROPONINI in the last 168 hours. BNP (last 3 results) No results for input(s): PROBNP in the last 8760 hours. HbA1C: Recent Labs    05/11/18 0235  HGBA1C 5.8*   CBG: Recent Labs  Lab 05/11/18 0812 05/11/18 1210  GLUCAP 86 84   Lipid Profile: No results for input(s): CHOL, HDL, LDLCALC, TRIG, CHOLHDL, LDLDIRECT in the last 72 hours. Thyroid Function Tests: Recent Labs    05/11/18 1308  TSH 2.619   Anemia Panel: Recent Labs    05/10/18 2119 05/11/18 0235  VITAMINB12 198  --   FOLATE 9.0  --   FERRITIN 1,154* 1,172*  TIBC 168* 183*  IRON 103 108  RETICCTPCT 1.1  --    Sepsis Labs: No results for input(s): PROCALCITON, LATICACIDVEN in the last 168 hours.  Recent Results (from the past 240 hour(s))  Urine Culture     Status: None   Collection Time: 05/10/18  6:14 PM  Result Value Ref Range Status   Specimen Description URINE, CLEAN CATCH  Final   Special Requests NONE  Final   Culture   Final    NO GROWTH Performed at [facility], [address], [place], [place] [address]    Report Status 05/11/2018 FINAL  Final         Radiology  Studies: Dg Chest 2 View  Result Date: 05/10/2018 CLINICAL DATA:  Generalized swelling. EXAM: CHEST - 2 VIEW COMPARISON:  December 13, 2010 FINDINGS: The heart size and mediastinal contours are within normal limits. Both lungs are clear. The visualized skeletal structures are unremarkable. IMPRESSION: No active cardiopulmonary disease. Electronically Signed   By: Dorise Bullion III M.D   On: 05/10/2018 16:16   US Renal Transplant  W/doppler  Result Date: 05/10/2018 CLINICAL DATA:  Acute renal failure.  Renal transplant. EXAM: ULTRASOUND OF RENAL TRANSPLANT WITH RENAL DOPPLER ULTRASOUND TECHNIQUE: Ultrasound examination of the renal transplant was performed with gray-scale, color and duplex doppler evaluation. COMPARISON:  None. FINDINGS: Transplant kidney location: Right lower quadrant Transplant Kidney: Renal measurements: 12.6 x 4.8 x 3.8 cm = volume: 118.38mL. Normal in size and parenchymal echogenicity. No evidence of mass or hydronephrosis. Color flow in the main renal artery:  Yes Color flow in the main renal vein:  Yes Duplex Doppler Evaluation: Main Renal Artery Resistive Index: 0.82 Venous waveform in main renal vein:  Present Intrarenal resistive index in upper pole:  0.79 (normal 0.6-0.8; equivocal 0.8-0.9; abnormal >= 0.9) Intrarenal resistive index in lower pole: 0.56 (normal 0.6-0.8; equivocal 0.8-0.9; abnormal >= 0.9) Bladder: Not visualized Other findings: There is a 7.1 x 5.6 x 4.8 complex fluid collection noted in the right lower quadrant adjacent to the lower pole of the transplant kidney. This was noted on prior outside study done at [facility] from 02/23/2013 when this measured up to 8.6 cm. IMPRESSION: Right lower quadrant renal transplant. No acute findings. No hydronephrosis. 7.1 cm complex fluid collection in the right lower quadrant adjacent to the lower pole of the transplant kidney. This has decreased in size when compared to outside report from [facility] ultrasound performed 02/23/2013.  Electronically Signed   By: Rolm Baptise M.D.   On: 05/10/2018 21:47        Scheduled Meds: . amLODipine  10 mg Oral Daily  . aspirin EC  81 mg Oral Daily  . atorvastatin  20 mg Oral Daily  . carvedilol  12.5 mg Oral BID  . Chlorhexidine Gluconate Cloth  6 each Topical Q0600  . heparin      . hydrALAZINE  50 mg Oral Q8H  . insulin aspart  0-9 Units Subcutaneous TID WC  . mycophenolate  1,000 mg Oral BID  . omega-3 acid ethyl esters  1 g Oral Daily  . predniSONE  5 mg Oral Q breakfast  . sodium chloride flush  3 mL Intravenous Q12H  . sodium chloride flush  3 mL Intravenous Q12H  . tacrolimus  2 mg Oral Q24H  . tacrolimus  3 mg Oral Q24H   Continuous Infusions: . sodium chloride    . sodium chloride    . sodium chloride    . furosemide Stopped (05/11/18 1530)     LOS: 1 day    Time spent: 40 minutes    Irine Seal, MD [facility] Pager [phone]  If 7PM-7AM, please contact night-coverage [email or web address] Password [email or web address] 05/11/2018, 5:09 PM

## 2018-05-11 NOTE — ED Notes (Signed)
Dr.Thompson contacted with postvoid residual and vs.

## 2018-05-11 NOTE — ED Notes (Signed)
Patient ambulated to the bathroom with minimal assistance.  

## 2018-05-11 NOTE — Progress Notes (Signed)
PT Cancellation Note  Patient Details Name: [name] MRN: [id number] DOB: 07-05-1975   Cancelled Treatment:    Reason Eval/Treat Not Completed: Patient at procedure or test/unavailable(OTF at this time.)   Duncan Dull 05/11/2018, 3:00 PM

## 2018-05-11 NOTE — Progress Notes (Signed)
Notified Dr. Justin Mend, nephrologist that pt's rt femoral dialysis catheter that was placed today has been bleeding but has almost clotted off. Notified IV team to come to bedside to assess site. Will continue to monitor pt. Ranelle Oyster, RN

## 2018-05-11 NOTE — Progress Notes (Signed)
[place] for Post IR procedure Consult - Pharmacist to review PTA and Inpatient med lists for interrupted anticoagulation / antiplatelet orders. May resume prophylaxis drug orders and therapeutic drug orders with prior protocols.     No Known Allergies  Labs: Recent Labs    05/10/18 1521 05/10/18 1811 05/10/18 2119 05/11/18 0344  WBC 7.8  --  7.8 8.6  HGB 8.1*  --  8.7* 8.4*  HCT 26.6*  --  28.2* 27.8*  PLT 141*  --  168 167  APTT  --   --  36  --   CREATININE 18.64*  --  17.81* 18.58*  LABCREA  --  79.94  --   --   PHOS  --   --  >12.0* 11.2*  ALBUMIN 2.5*  --  2.5* 2.7*  PROT 5.8*  --   --  5.6*  AST 10*  --   --  8*  ALT 12  --   --  11  ALKPHOS 38  --   --  36*  BILITOT 0.6  --   --  0.6   CrCl cannot be calculated (Unknown ideal weight.).    Assessment: S/p placement of tunneled right CFV approach HD catheter by IR today 05/11/18. Exam ended 05/11/18 at 16:25 .  Pharmacy consulted to  review PTA and Inpatient med lists for interrupted anticoagulation / antiplatelet orders. May resume prophylaxis drug orders and therapeutic drug orders with prior protocols.   Patient was not on anticoagulation PTA per review of PTA medication list. Inpatient order for DVT prophylaxis:  Heparin 5000 units SQ q8h was held/ discontinued prior to IR procedure by Candiss Norse, PA-C with radiology.    Plan:  Resume the heparin 5000 units SQ q8h for DVT prophylaxis with next schedule dose tonight at 23:00.  Nicole Cella, RPh Clinical Pharmacist 05/11/2018,6:35 PM

## 2018-05-11 NOTE — Consult Note (Addendum)
CONSULT NOTE   MRN : [id number]  Reason for Consult: AKI on CKD with history of multiple failed accesses  Referring Physician: Dr. Hollie Salk  History of Present Illness: 42 y/o male with AKI on CKD s/p renal transplant and history of multiple failed accesses.  Left arm forearm graft, and upper arm fistula, right arm upper arm fistula, left thigh graft.  We have been consulted to place new permanent access and IR has been asked to place [facility].  He received a right [facility] 05/11/2018 by IR.    He denise pain, los of sensation and loss of motor post previous access procedures.  Past medical history includes kidney transplant, hypertension, type 2 diabetes mellitus, history of congestive heart failure, morbid obesity, gastroesophageal reflux disease, obstructive sleep apnea on CPAP.     Current Facility-Administered Medications  Medication Dose Route Frequency Provider Last Rate Last Dose  . 0.9 %  sodium chloride infusion  250 mL Intravenous PRN Eugenie Filler, MD      . 0.9 %  sodium chloride infusion  100 mL Intravenous PRN Dwana Melena, MD      . 0.9 %  sodium chloride infusion  100 mL Intravenous PRN Dwana Melena, MD      . acetaminophen (TYLENOL) tablet 650 mg  650 mg Oral Q6H PRN Eugenie Filler, MD   650 mg at 05/11/18 0401   Or  . acetaminophen (TYLENOL) suppository 650 mg  650 mg Rectal Q6H PRN Eugenie Filler, MD      . alteplase (CATHFLO ACTIVASE) injection 2 mg  2 mg Intracatheter Once PRN Dwana Melena, MD      . amLODipine (NORVASC) tablet 10 mg  10 mg Oral Daily Eugenie Filler, MD   10 mg at 05/11/18 0803  . aspirin EC tablet 81 mg  81 mg Oral Daily Eugenie Filler, MD   81 mg at 05/11/18 1224  . atorvastatin (LIPITOR) tablet 20 mg  20 mg Oral Daily Eugenie Filler, MD   20 mg at 05/11/18 1226  . carvedilol (COREG) tablet 12.5 mg  12.5 mg Oral BID Eugenie Filler, MD   12.5 mg at 05/11/18 0803  . ceFAZolin (ANCEF) IVPB 1 g/50 mL premix  1 g Intravenous To  SS-Surg Eugenie Filler, MD      . Chlorhexidine Gluconate Cloth 2 % PADS 6 each  6 each Topical Q0600 Dwana Melena, MD      . furosemide (LASIX) 120 mg in dextrose 5 % 50 mL IVPB  120 mg Intravenous Q8H Eugenie Filler, MD 62 mL/hr at 05/11/18 1435 120 mg at 05/11/18 1435  . heparin injection 1,000 Units  1,000 Units Dialysis PRN Dwana Melena, MD      . hydrALAZINE (APRESOLINE) injection 10 mg  10 mg Intravenous Q6H PRN Eugenie Filler, MD      . hydrALAZINE (APRESOLINE) tablet 50 mg  50 mg Oral Q8H Eugenie Filler, MD   50 mg at 05/11/18 1402  . insulin aspart (novoLOG) injection 0-9 Units  0-9 Units Subcutaneous TID WC Eugenie Filler, MD      . lidocaine (PF) (XYLOCAINE) 1 % injection 5 mL  5 mL Intradermal PRN Dwana Melena, MD      . lidocaine-prilocaine (EMLA) cream 1 application  1 application Topical PRN Dwana Melena, MD      . mycophenolate (CELLCEPT) capsule 1,000 mg  1,000 mg Oral BID Eugenie Filler,  MD   1,000 mg at 05/11/18 1224  . omega-3 acid ethyl esters (LOVAZA) capsule 1 g  1 g Oral Daily Eugenie Filler, MD   1 g at 05/11/18 1224  . ondansetron (ZOFRAN) tablet 4 mg  4 mg Oral Q6H PRN Eugenie Filler, MD       Or  . ondansetron [facility]) injection 4 mg  4 mg Intravenous Q6H PRN Eugenie Filler, MD      . pentafluoroprop-tetrafluoroeth Landry Dyke) aerosol 1 application  1 application Topical PRN Dwana Melena, MD      . polyethylene glycol (MIRALAX / Floria Raveling) packet 17 g  17 g Oral Daily PRN Eugenie Filler, MD      . predniSONE (DELTASONE) tablet 5 mg  5 mg Oral Q breakfast Eugenie Filler, MD   5 mg at 05/11/18 1223  . sodium chloride flush (NS) 0.9 % injection 3 mL  3 mL Intravenous Q12H Eugenie Filler, MD   3 mL at 05/11/18 0403  . sodium chloride flush (NS) 0.9 % injection 3 mL  3 mL Intravenous Q12H Eugenie Filler, MD   3 mL at 05/11/18 0404  . sodium chloride flush (NS) 0.9 % injection 3 mL  3 mL Intravenous PRN Eugenie Filler, MD       . sorbitol 70 % solution 30 mL  30 mL Oral Daily PRN Eugenie Filler, MD      . tacrolimus (PROGRAF) capsule 2 mg  2 mg Oral Q24H Eugenie Filler, MD      . tacrolimus (PROGRAF) capsule 3 mg  3 mg Oral Q24H Eugenie Filler, MD   3 mg at 05/11/18 1225    Pt meds include: Statin :Yes Betablocker: No ASA: Yes Other anticoagulants/antiplatelets: none  Past Medical History:  Diagnosis Date  . Anemia    ESRD  . CHF (congestive heart failure) ([place])   . Chronic kidney disease 06/07/2011   Hemodialysis-TTS Last hemo today 05/08/2011  . Diabetes mellitus without complication ([place])   . GERD (gastroesophageal reflux disease)   . Hypertension 111/29/2012   pt states he takes no HTN meds  . Hypothyroidism, secondary   . Morbid obesity ([place])   . Paroxysmal atrial fibrillation (HCC)   . Sleep apnea 06/07/2011   Pt had sllep study done 2 weeks ago- Dr. Joellyn Quails.  Does not  have a CPAP at this time.    Past Surgical History:  Procedure Laterality Date  . ARTERIOVENOUS GRAFT PLACEMENT  08/09/10   Left Thigh Graft by Dr. [name]  . CAPD REMOVAL  05/08/2011   Procedure: CONTINUOUS AMBULATORY PERITONEAL DIALYSIS  (CAPD) CATHETER REMOVAL;  Surgeon: [name], MD;  Location: [facility] OR;  Service: General;  Laterality: N/A;  Removal of CAPD catheter, Dr. requests to go after 100  . INSERTION OF DIALYSIS CATHETER  10/05/10   Right Femoral Cath insertion by Dr. [name].  Pt ahas had several caths inserted.  Marland Kitchen KIDNEY TRANSPLANT  2014    Social History Social History   Tobacco Use  . Smoking status: Never Smoker  . Smokeless tobacco: Never Used  Substance Use Topics  . Alcohol use: No  . Drug use: Yes    Frequency: 5.0 times per week    Types: Marijuana    Family History Family History  Problem Relation Age of Onset  . Diabetes Father   . Stroke Father   . Anesthesia problems Neg Hx     No Known  Allergies   REVIEW OF SYSTEMS  General: [ ]  Weight  loss, [ ]  Fever, [ ]  chills Neurologic: [ ]  Dizziness, [ ]  Blackouts, [ ]  Seizure [ ]  Stroke, [ ]  "Mini stroke", [ ]  Slurred speech, [ ]  Temporary blindness; [ ]  weakness in arms or legs, [ ]  Hoarseness [ ]  Dysphagia Cardiac: [ ]  Chest pain/pressure, [ ]  Shortness of breath at rest [ ]  Shortness of breath with exertion, [ ]  Atrial fibrillation or irregular heartbeat  Vascular: [ ]  Pain in legs with walking, [ ]  Pain in legs at rest, [ ]  Pain in legs at night,  [ ]  Non-healing ulcer, [ ]  Blood clot in vein/DVT,   Pulmonary: [ ]  Home oxygen, [ ]  Productive cough, [ ]  Coughing up blood, [ ]  Asthma,  [ ]  Wheezing [ ]  COPD Musculoskeletal:  [ ]  Arthritis, [ ]  Low back pain, [ ]  Joint pain Hematologic: [ ]  Easy Bruising, [x ] Anemia; [ ]  Hepatitis Gastrointestinal: [ ]  Blood in stool, [ ]  Gastroesophageal Reflux/heartburn, Urinary: [x ] chronic Kidney disease, [ ]  on HD - [ ]  MWF or [ ]  TTHS, [ ]  Burning with urination, [ ]  Difficulty urinating Skin: [ ]  Rashes, [ ]  Wounds Psychological: [ ]  Anxiety, [ ]  Depression  Physical Examination Vitals:   05/11/18 1230 05/11/18 1231 05/11/18 1330 05/11/18 1400  BP: (!) 159/84 (!) 183/101 (!) 173/108 (!) 166/93  Pulse: 86 82 84 88  Resp: 16 14 (!) 21 (!) 23  Temp:      TempSrc:      SpO2: 99% 98% 99% 100%   There is no height or weight on file to calculate BMI.  General:  WDWN in NAD Gait: Normal HENT: WNL, normocephalic Eyes: Pupils equal Pulmonary: normal non-labored breathing , without Rales, rhonchi,  wheezing Cardiac: RRR, without  Murmurs, rubs or gallops; Abdomen: soft, NT, no masses Skin: no rashes, ulcers noted;  no Gangrene , no cellulitis; no open wounds;   Vascular Exam/Pulses:Palapable radial, brachial, right DP, left PT pulses.   Musculoskeletal: no muscle wasting or atrophy; generalized lower ext. edema  Neurologic: A&O X 3; Appropriate Affect ;  SENSATION: normal; MOTOR FUNCTION: 5/5 Symmetric Speech is  fluent/normal   Significant Diagnostic Studies: CBC Lab Results  Component Value Date   WBC 8.6 05/11/2018   HGB 8.4 (L) 05/11/2018   HCT 27.8 (L) 05/11/2018   MCV 82.7 05/11/2018   PLT 167 05/11/2018    BMET    Component Value Date/Time   NA 140 05/11/2018 0344   K 4.1 05/11/2018 0344   CL 106 05/11/2018 0344   CO2 14 (L) 05/11/2018 0344   GLUCOSE 133 (H) 05/11/2018 0344   BUN 135 (H) 05/11/2018 0344   CREATININE 18.58 (H) 05/11/2018 0344   CALCIUM 7.5 (L) 05/11/2018 0344   GFRNONAA 3 (L) 05/11/2018 0344   GFRAA 3 (L) 05/11/2018 0344   CrCl cannot be calculated (Unknown ideal weight.).  COAG Lab Results  Component Value Date   INR 1.21 05/11/2018   INR 1.16 05/10/2018   INR 1.1 02/09/2009     Non-Invasive Vascular Imaging:  Vein mapping pending  ASSESSMENT/PLAN:  AKI on CKD s/p transplant.  He has had multiple access intervention in the past to include left UE x 2, right UE x 1 and left thigh graft prior to his kidney transplant.  We will review the vein mapping, if there are no veins acceptable we may need to perform a venogram.  Roxy Horseman 05/11/2018 3:08 PM   I have independently interviewed and examined patient and agree with PA assessment and plan above.  Patient was evaluated in dialysis.  Has previous failed upper extremity accesses bilaterally.  Does apparently have what would may be suitable cephalic vein on the left for fistula although there is some thrombus in the antecubitum and he currently has an IV in the forearm.  We will plan bilateral upper extremity venogram tomorrow in the [facility] lab.  He will be n.p.o. past midnight.  Formal permanent access to be placed pending tomorrow's results.  Brandon C. Donzetta Matters, MD [facility] Office: [phone] Pager: [phone]

## 2018-05-11 NOTE — Consult Note (Signed)
Chief Complaint: Patient was seen in consultation today for tunneled HD catheter placement  Referring Physician(s): Dr. Otelia Santee  Supervising Physician: Sandi Mariscal  Patient Status: [facility] - ED  History of Present Illness: [name] is a 42 y.o. male with a past medical history significant for anemia, CHF, DM II, poorly controlled HTN, hypothyroidism, paroxysmal a.fib (not on anticoagulation), OSA on CPAP and ESRD s/p kidney transplant 2014 who presented to [facility] ED on 12/2 with complaints of facial and leg swelling x 1 week. He states that he thinks this is due to his kidneys not working. He states he was previously on dialysis and has had several HD catheters placed, however he has not required dialysis since his kidney transplant. CMP shows creatinine 18.64 with previous 3.77 01/25/18. Request has been made to IR for tunneled HD catheter placement for dialysis today.  Patient sleeping upon entry to room, arousable by voice cues but somnolent - unhappy that he can't sleep. He states last PO intake was 11 pm which was a sip of ginger ale with medications. He states he is aware of need for HD and is familiar with HD catheter placement.   Past Medical History:  Diagnosis Date  . Anemia    ESRD  . CHF (congestive heart failure) ([place])   . Chronic kidney disease 06/07/2011   Hemodialysis-TTS Last hemo today 05/08/2011  . Diabetes mellitus without complication ([place])   . GERD (gastroesophageal reflux disease)   . Hypertension 111/29/2012   pt states he takes no HTN meds  . Hypothyroidism, secondary   . Morbid obesity ([place])   . Paroxysmal atrial fibrillation ([facility])   . Sleep apnea 06/07/2011   Pt had sllep study done 2 weeks ago- Dr. Joellyn Quails.  Does not  have a CPAP at this time.    Past Surgical History:  Procedure Laterality Date  . ARTERIOVENOUS GRAFT PLACEMENT  08/09/10   Left Thigh Graft by Dr. [name]  . CAPD REMOVAL  05/08/2011   Procedure: CONTINUOUS AMBULATORY  PERITONEAL DIALYSIS  (CAPD) CATHETER REMOVAL;  Surgeon: [name], MD;  Location: [facility] OR;  Service: General;  Laterality: N/A;  Removal of CAPD catheter, Dr. requests to go after 100  . INSERTION OF DIALYSIS CATHETER  10/05/10   Right Femoral Cath insertion by Dr. [name].  Pt ahas had several caths inserted.  Marland Kitchen KIDNEY TRANSPLANT  2014    Allergies: Patient has no known allergies.  Medications: Prior to Admission medications   Medication Sig Start Date End Date Taking? Authorizing Provider  acetaminophen (TYLENOL) 500 MG tablet Take 1,000 mg by mouth every 6 (six) hours as needed for mild pain or headache.   Yes [provider]  amLODipine (NORVASC) 10 MG tablet  12/29/17  Yes [provider]  aspirin EC 81 MG tablet Take 81 mg by mouth daily.   Yes [provider]  carvedilol (COREG) 25 MG tablet Take 12.5 mg by mouth 2 (two) times daily. 04/01/18 04/01/19 Yes [provider]  Cholecalciferol (VITAMIN D3) 2000 units capsule Take by mouth.   Yes [provider]  mycophenolate (CELLCEPT) 500 MG tablet Take 1,000 mg by mouth 2 (two) times daily.  12/23/17  Yes [provider]  Omega-3 Fatty Acids (FISH OIL) 1000 MG CAPS Take 1,000 mg by mouth daily.   Yes [provider]  predniSONE (DELTASONE) 5 MG tablet Take 5 mg by mouth daily with breakfast.  06/22/17  Yes [provider]  tacrolimus (  PROGRAF) 1 MG capsule Take 2-3 mg by mouth See admin instructions. Take three capsules (3mg  total) at 9 AM and two capsules (2mg  total) at 9 PM 12/23/17  Yes [provider]  atorvastatin (LIPITOR) 20 MG tablet Take 1 tablet (20 mg total) by mouth daily. Patient not taking: Reported on 05/10/2018 01/29/18   Luetta Nutting, DO     Family History  Problem Relation Age of Onset  . Diabetes Father   . Stroke Father   . Anesthesia problems Neg Hx     Social History   Socioeconomic History  . Marital status: Single     Spouse name: Not on file  . Number of children: 0  . Years of education: Not on file  . Highest education level: Not on file  Occupational History    Employer: Short  . Financial resource strain: Not on file  . Food insecurity:    Worry: Not on file    Inability: Not on file  . Transportation needs:    Medical: Not on file    Non-medical: Not on file  Tobacco Use  . Smoking status: Never Smoker  . Smokeless tobacco: Never Used  Substance and Sexual Activity  . Alcohol use: No  . Drug use: Yes    Frequency: 5.0 times per week    Types: Marijuana  . Sexual activity: Not on file  Lifestyle  . Physical activity:    Days per week: Not on file    Minutes per session: Not on file  . Stress: Not on file  Relationships  . Social connections:    Talks on phone: Not on file    Gets together: Not on file    Attends religious service: Not on file    Active member of club or organization: Not on file    Attends meetings of clubs or organizations: Not on file    Relationship status: Not on file  Other Topics Concern  . Not on file  Social History Narrative   Lives alone.      Review of Systems: A 12 point ROS discussed and pertinent positives are indicated in the HPI above.  All other systems are negative.  Review of Systems  Constitutional: Negative for chills and fever.  Respiratory: Negative for cough and shortness of breath.   Cardiovascular: Positive for leg swelling. Negative for chest pain.  Gastrointestinal: Negative for abdominal pain, nausea and vomiting.  Neurological: Negative for dizziness and syncope.  Psychiatric/Behavioral: Negative for confusion.    Vital Signs: BP (!) 181/110   Pulse 85   Temp 98 F (36.7 C) (Oral)   Resp 19   SpO2 99%   Physical Exam  Constitutional: He is oriented to person, place, and time. No distress.  HENT:  Head: Normocephalic.  Cardiovascular: Normal rate, regular rhythm and normal heart sounds.    Pulmonary/Chest: Effort normal.  Abdominal: Soft. There is no tenderness.  Neurological: He is alert and oriented to person, place, and time.  Skin: Skin is warm and dry. He is not diaphoretic.  Psychiatric: He has a normal mood and affect. His behavior is normal. Judgment and thought content normal.  Nursing note and vitals reviewed.    MD Evaluation Airway: WNL Heart: WNL Abdomen: WNL Chest/ Lungs: WNL ASA  Classification: 3 Mallampati/Airway Score: Two   Imaging: Dg Chest 2 View  Result Date: 05/10/2018 CLINICAL DATA:  Generalized swelling. EXAM: CHEST - 2 VIEW COMPARISON:  December 13, 2010 FINDINGS: The heart  size and mediastinal contours are within normal limits. Both lungs are clear. The visualized skeletal structures are unremarkable. IMPRESSION: No active cardiopulmonary disease. Electronically Signed   By: Dorise Bullion III M.D   On: 05/10/2018 16:16   US Renal Transplant W/doppler  Result Date: 05/10/2018 CLINICAL DATA:  Acute renal failure.  Renal transplant. EXAM: ULTRASOUND OF RENAL TRANSPLANT WITH RENAL DOPPLER ULTRASOUND TECHNIQUE: Ultrasound examination of the renal transplant was performed with gray-scale, color and duplex doppler evaluation. COMPARISON:  None. FINDINGS: Transplant kidney location: Right lower quadrant Transplant Kidney: Renal measurements: 12.6 x 4.8 x 3.8 cm = volume: 118.97mL. Normal in size and parenchymal echogenicity. No evidence of mass or hydronephrosis. Color flow in the main renal artery:  Yes Color flow in the main renal vein:  Yes Duplex Doppler Evaluation: Main Renal Artery Resistive Index: 0.82 Venous waveform in main renal vein:  Present Intrarenal resistive index in upper pole:  0.79 (normal 0.6-0.8; equivocal 0.8-0.9; abnormal >= 0.9) Intrarenal resistive index in lower pole: 0.56 (normal 0.6-0.8; equivocal 0.8-0.9; abnormal >= 0.9) Bladder: Not visualized Other findings: There is a 7.1 x 5.6 x 4.8 complex fluid collection noted in the right  lower quadrant adjacent to the lower pole of the transplant kidney. This was noted on prior outside study done at [facility] from 02/23/2013 when this measured up to 8.6 cm. IMPRESSION: Right lower quadrant renal transplant. No acute findings. No hydronephrosis. 7.1 cm complex fluid collection in the right lower quadrant adjacent to the lower pole of the transplant kidney. This has decreased in size when compared to outside report from [facility] ultrasound performed 02/23/2013. Electronically Signed   By: Rolm Baptise M.D.   On: 05/10/2018 21:47    Labs:  CBC: Recent Labs    01/25/18 0851 05/10/18 1521 05/10/18 2119 05/11/18 0344  WBC 6.1 7.8 7.8 8.6  HGB 12.7* 8.1* 8.7* 8.4*  HCT 39.8 26.6* 28.2* 27.8*  PLT 271.0 141* 168 167    COAGS: Recent Labs    05/10/18 2119 05/11/18 0344  INR 1.16 1.21  APTT 36  --     BMP: Recent Labs    01/25/18 0851 05/10/18 1521 05/10/18 2119 05/11/18 0344  NA 139 138 139 140  K 4.1 4.0 4.4 4.1  CL 108 101 105 106  CO2 22 16* 15* 14*  GLUCOSE 97 126* 102* 133*  BUN 46* 128* 126* 135*  CALCIUM 9.5 7.5* 7.3* 7.5*  CREATININE 3.77* 18.64* 17.81* 18.58*  GFRNONAA  --  3* 3* 3*  GFRAA  --  3* 3* 3*    LIVER FUNCTION TESTS: Recent Labs    01/25/18 0851 05/10/18 1521 05/10/18 2119 05/11/18 0344  BILITOT 0.4 0.6  --  0.6  AST 10 10*  --  8*  ALT 13 12  --  11  ALKPHOS 31* 38  --  36*  PROT 6.2 5.8*  --  5.6*  ALBUMIN 3.5 2.5* 2.5* 2.7*    TUMOR MARKERS: No results for input(s): AFPTM, CEA, CA199, CHROMGRNA in the last 8760 hours.  Assessment and Plan:  Patient with history of ESRD s/p kidney transplant 2014 who presents to [facility] ED with complaints of leg/facial swelling. Creatinine 18.64 on initial labs with previous 3.77 (01/25/18). Request has been made to IR for placement of tunneled HD catheter to initiate dialysis today.  Patient has been NPO since 11 pm, he did receive heparin SQ at 6:00 am, he does not take blood thinning medications at  home. He is afebrile, WBC  8.6, hgb 8.4, INR 1.21.   Planned for tunneled HD catheter placement this afternoon due to patient receiving heparin SQ at 6:00 am - patient to remain NPO, no heparin until post-procedure.  Risks and benefits discussed with the patient including, but not limited to bleeding, infection, vascular injury, pneumothorax which may require chest tube placement, air embolism or even death.  All of the patient's questions were answered, patient is agreeable to proceed.  Consent signed and in chart.  Thank you for this interesting consult.  I greatly enjoyed meeting [name] and look forward to participating in their care.  A copy of this report was sent to the requesting provider on this date.  Electronically Signed: Joaquim Nam, PA-C 05/11/2018, 7:42 AM   I spent a total of 40 Minutes in face to face in clinical consultation, greater than 50% of which was counseling/coordinating care for tunneled HD catheter placement.

## 2018-05-11 NOTE — Procedures (Signed)
Pre-procedure Diagnosis: ESRD Post-procedure Diagnosis: Same  Successful placement of tunneled right CFV approach HD catheter with tips terminating within the inferior caval atrial junction.   Complications: None Immediate  EBL: Minimal   The catheter is ready for immediate use.   Ronny Bacon, MD Pager #: [phone]

## 2018-05-11 NOTE — Progress Notes (Signed)
RT NOTE: RT called by RN as patient has CPAP order QHS. RN says patient very sleepy and needs CPAP for rest. RT set patient up on CPAP with pressures of 6 per his home regimen. Vitals are stable and patient is resting comfortably. RT will continue to monitor.

## 2018-05-11 NOTE — ED Notes (Signed)
Pt has removed his cpap

## 2018-05-12 ENCOUNTER — Inpatient Hospital Stay (HOSPITAL_COMMUNITY): Payer: 59

## 2018-05-12 DIAGNOSIS — N185 Chronic kidney disease, stage 5: Secondary | ICD-10-CM

## 2018-05-12 DIAGNOSIS — Z01818 Encounter for other preprocedural examination: Secondary | ICD-10-CM

## 2018-05-12 DIAGNOSIS — I5020 Unspecified systolic (congestive) heart failure: Secondary | ICD-10-CM

## 2018-05-12 DIAGNOSIS — T82898A Other specified complication of vascular prosthetic devices, implants and grafts, initial encounter: Secondary | ICD-10-CM

## 2018-05-12 LAB — CBC
HCT: 27.5 % — ABNORMAL LOW (ref 39.0–52.0)
HEMATOCRIT: 25.9 % — AB (ref 39.0–52.0)
Hemoglobin: 7.7 g/dL — ABNORMAL LOW (ref 13.0–17.0)
Hemoglobin: 8.8 g/dL — ABNORMAL LOW (ref 13.0–17.0)
MCH: 25 pg — ABNORMAL LOW (ref 26.0–34.0)
MCH: 26 pg (ref 26.0–34.0)
MCHC: 29.7 g/dL — ABNORMAL LOW (ref 30.0–36.0)
MCHC: 32 g/dL (ref 30.0–36.0)
MCV: 84.1 fL (ref 80.0–100.0)
MCV: 81.4 fL (ref 80.0–100.0)
Platelets: 164 10*3/uL (ref 150–400)
Platelets: 161 10*3/uL (ref 150–400)
RBC: 3.08 MIL/uL — ABNORMAL LOW (ref 4.22–5.81)
RBC: 3.38 MIL/uL — ABNORMAL LOW (ref 4.22–5.81)
RDW: 15.1 % (ref 11.5–15.5)
RDW: 14.9 % (ref 11.5–15.5)
WBC: 7.9 10*3/uL (ref 4.0–10.5)
WBC: 6.8 10*3/uL (ref 4.0–10.5)
nRBC: 0 % (ref 0.0–0.2)
nRBC: 0 % (ref 0.0–0.2)

## 2018-05-12 LAB — RENAL FUNCTION PANEL
ANION GAP: 18 — AB (ref 5–15)
Albumin: 2.3 g/dL — ABNORMAL LOW (ref 3.5–5.0)
Albumin: 2.4 g/dL — ABNORMAL LOW (ref 3.5–5.0)
Anion gap: 11 (ref 5–15)
BUN: 139 mg/dL — ABNORMAL HIGH (ref 6–20)
BUN: 56 mg/dL — ABNORMAL HIGH (ref 6–20)
CO2: 15 mmol/L — ABNORMAL LOW (ref 22–32)
CO2: 27 mmol/L (ref 22–32)
Calcium: 7.6 mg/dL — ABNORMAL LOW (ref 8.9–10.3)
Calcium: 8 mg/dL — ABNORMAL LOW (ref 8.9–10.3)
Chloride: 107 mmol/L (ref 98–111)
Chloride: 101 mmol/L (ref 98–111)
Creatinine, Ser: 19.3 mg/dL — ABNORMAL HIGH (ref 0.61–1.24)
Creatinine, Ser: 10.58 mg/dL — ABNORMAL HIGH (ref 0.61–1.24)
GFR calc Af Amer: 3 mL/min — ABNORMAL LOW (ref >60)
GFR calc Af Amer: 6 mL/min — ABNORMAL LOW (ref >60)
GFR calc non Af Amer: 3 mL/min — ABNORMAL LOW (ref >60)
GFR calc non Af Amer: 5 mL/min — ABNORMAL LOW (ref >60)
GLUCOSE: 149 mg/dL — AB (ref 70–99)
Glucose, Bld: 148 mg/dL — ABNORMAL HIGH (ref 70–99)
PHOSPHORUS: 14.1 mg/dL — AB (ref 2.5–4.6)
Phosphorus: 7.4 mg/dL — ABNORMAL HIGH (ref 2.5–4.6)
Potassium: 4.3 mmol/L (ref 3.5–5.1)
Potassium: 3.5 mmol/L (ref 3.5–5.1)
Sodium: 140 mmol/L (ref 135–145)
Sodium: 139 mmol/L (ref 135–145)

## 2018-05-12 LAB — ABO/RH: ABO/RH(D): A POS

## 2018-05-12 LAB — GLUCOSE, CAPILLARY
GLUCOSE-CAPILLARY: 87 mg/dL (ref 70–99)
GLUCOSE-CAPILLARY: 149 mg/dL — AB (ref 70–99)
Glucose-Capillary: 117 mg/dL — ABNORMAL HIGH (ref 70–99)
Glucose-Capillary: 107 mg/dL — ABNORMAL HIGH (ref 70–99)

## 2018-05-12 LAB — HEPATITIS B SURFACE ANTIGEN: Hepatitis B Surface Ag: NEGATIVE

## 2018-05-12 LAB — ECHOCARDIOGRAM COMPLETE
Height: 74 in
Weight: 5770.76 oz

## 2018-05-12 LAB — LIPID PANEL
Cholesterol: 266 mg/dL — ABNORMAL HIGH (ref 0–200)
HDL: 45 mg/dL (ref >40)
LDL Cholesterol: 189 mg/dL — ABNORMAL HIGH (ref 0–99)
Total CHOL/HDL Ratio: 5.9 RATIO
Triglycerides: 159 mg/dL — ABNORMAL HIGH (ref <150)
VLDL: 32 mg/dL (ref 0–40)

## 2018-05-12 LAB — VITAMIN D 25 HYDROXY (VIT D DEFICIENCY, FRACTURES): Vit D, 25-Hydroxy: 12.7 ng/mL — ABNORMAL LOW (ref 30.0–100.0)

## 2018-05-12 LAB — PARATHYROID HORMONE, INTACT (NO CA): PTH: 324 pg/mL — AB (ref 15–65)

## 2018-05-12 LAB — ALT: ALT: 10 U/L (ref 0–44)

## 2018-05-12 LAB — PREPARE RBC (CROSSMATCH)

## 2018-05-12 MED ORDER — HEPARIN SODIUM (PORCINE) 1000 UNIT/ML IJ SOLN
INTRAMUSCULAR | Status: AC
  Filled 2018-05-12: qty 6

## 2018-05-12 MED ORDER — HYDRALAZINE HCL 20 MG/ML IJ SOLN
10.0000 mg | Freq: Once | INTRAMUSCULAR | Status: AC
  Administered 2018-05-12: 10 mg via INTRAVENOUS
  Filled 2018-05-12: qty 1

## 2018-05-12 MED ORDER — SODIUM CHLORIDE 0.9% IV SOLUTION: Freq: Once | INTRAVENOUS | Status: DC

## 2018-05-12 MED ORDER — CARVEDILOL 25 MG PO TABS
25.0000 mg | ORAL_TABLET | Freq: Two times a day (BID) | ORAL | Status: DC
  Administered 2018-05-12 – 2018-05-21 (×19): 25 mg via ORAL
  Filled 2018-05-12 (×16): qty 1
  Filled 2018-05-12: qty 2
  Filled 2018-05-12 (×3): qty 1

## 2018-05-12 MED ORDER — TACROLIMUS 1 MG PO CAPS
1.0000 mg | ORAL_CAPSULE | Freq: Two times a day (BID) | ORAL | Status: DC
  Administered 2018-05-12 – 2018-05-14 (×4): 1 mg via ORAL
  Filled 2018-05-12 (×6): qty 1

## 2018-05-12 MED ORDER — CLONIDINE HCL 0.2 MG PO TABS
0.2000 mg | ORAL_TABLET | Freq: Three times a day (TID) | ORAL | Status: DC
  Administered 2018-05-12 – 2018-05-21 (×25): 0.2 mg via ORAL
  Filled 2018-05-12 (×26): qty 1

## 2018-05-12 MED ORDER — HYDRALAZINE HCL 50 MG PO TABS
100.0000 mg | ORAL_TABLET | Freq: Three times a day (TID) | ORAL | Status: DC
  Administered 2018-05-12 – 2018-05-14 (×7): 100 mg via ORAL
  Filled 2018-05-12 (×8): qty 2

## 2018-05-12 MED ORDER — HEPARIN SODIUM (PORCINE) 1000 UNIT/ML IJ SOLN
INTRAMUSCULAR | Status: AC
  Filled 2018-05-12: qty 4

## 2018-05-12 MED ORDER — HYDRALAZINE HCL 50 MG PO TABS
75.0000 mg | ORAL_TABLET | Freq: Three times a day (TID) | ORAL | Status: DC
  Administered 2018-05-12: 75 mg via ORAL
  Filled 2018-05-12 (×2): qty 1

## 2018-05-12 MED ORDER — HYDRALAZINE HCL 20 MG/ML IJ SOLN
10.0000 mg | INTRAMUSCULAR | Status: DC | PRN
  Filled 2018-05-12: qty 1

## 2018-05-12 MED ORDER — CLONIDINE HCL 0.2 MG PO TABS
0.2000 mg | ORAL_TABLET | Freq: Two times a day (BID) | ORAL | Status: DC
  Administered 2018-05-12: 0.2 mg via ORAL
  Filled 2018-05-12 (×2): qty 1

## 2018-05-12 MED ORDER — HEPARIN SODIUM (PORCINE) 1000 UNIT/ML IJ SOLN
INTRAMUSCULAR | Status: AC
  Administered 2018-05-12: 1000 [IU] via INTRAVENOUS_CENTRAL
  Filled 2018-05-12: qty 6

## 2018-05-12 MED ORDER — HYDRALAZINE HCL 20 MG/ML IJ SOLN
INTRAMUSCULAR | Status: AC
  Administered 2018-05-12: 10 mg via INTRAVENOUS
  Filled 2018-05-12: qty 1

## 2018-05-12 MED ORDER — CLONIDINE HCL 0.1 MG PO TABS
0.1000 mg | ORAL_TABLET | Freq: Once | ORAL | Status: AC
  Administered 2018-05-12: 0.1 mg via ORAL
  Filled 2018-05-12: qty 1

## 2018-05-12 MED ORDER — CLONIDINE HCL 0.2 MG PO TABS: 0.2000 mg | ORAL_TABLET | Freq: Two times a day (BID) | ORAL | Status: DC

## 2018-05-12 NOTE — Procedures (Signed)
Patient seen and examined on Hemodialysis. QB 300 mL/ min via fem TDC UF goal 2.5L  Hypertensive emergency--> BP better with UF, monitor closely.  Treatment adjusted as needed.  Madelon Lips MD [facility] pgr [phone] 2:53 PM

## 2018-05-12 NOTE — Progress Notes (Signed)
[facility] Progress Note   Assessment/ Plan:   1.  AKI on CKD4 with a creatinine that was already 4.8 in 03/03/2018 with inadequate tissue from a tx renal bx 11/2017. 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 Likely has progressed to ESRD.  [place] placed with IR 12/3, VVS to place access tomorrow.  Tac trough pending.  Obstruction not likely, PVR 23.  Awaiting callback from [place] transplant, UP/C pending.    2. DDRT with h/o DGF @ [facility]- prednisone 5 mg daily, MMF 1000 BID, tac 3/2 BID--> tac trough pending  4. Hypertensive urgency/emergency- pressures initially better on home meds but now is having recurrence of hypertensive urgency/ emergency.  HD will help this; will increase coreg.  Hydralazine already increased this AM, also on clonidine and amlodipine.  Of note, he was on losartan as OP- hold for now, may need to add back (would do this once we are sure there is no chance for renal recovery)  5. DM  6. Secondary HPT- check PTH  6. Chronic Anemia- getting 1 u pRBCs today during HD, will start ESA   7. OSA- needs to be adherent to CPAP  Subjective:    S/p TDC with IR yesterday, first dialysis.  Tolerated well.  Hwoever, this AM having headaches, vomiting, high BP despite multiple IV hydralazine pushes (30 mg IV so far).     Objective:   BP (!) 197/98   Pulse 90   Temp 97.7 F (36.5 C) (Axillary)   Resp 17   Ht 6\' 2"  (1.88 m)   Wt (!) 163.6 kg   SpO2 97%   BMI 46.31 kg/m   Physical Exam: Gen: obese, appears uncomfortable CVS: RRR Resp: fine crackles at bases Abd: obese Ext: 2+ LE edema  Labs: BMET Recent Labs  Lab 05/10/18 1521 05/10/18 2119 05/11/18 0344 05/12/18 0051  NA 138 139 140 140  K 4.0 4.4 4.1 4.3  CL 101 105 106 107  CO2 16* 15* 14* 15*  GLUCOSE 126* 102* 133* 149*  BUN 128* 126* 135* 139*  CREATININE 18.64* 17.81* 18.58* 19.30*  CALCIUM 7.5* 7.3* 7.5* 7.6*  PHOS  --  >12.0* 11.2* 14.1*   CBC Recent Labs   Lab 05/10/18 1521 05/10/18 2119 05/11/18 0344 05/12/18 0051  WBC 7.8 7.8 8.6 7.9  NEUTROABS 6.4  --   --   --   HGB 8.1* 8.7* 8.4* 7.7*  HCT 26.6* 28.2* 27.8* 25.9*  MCV 82.9 82.7 82.7 84.1  PLT 141* 168 167 164    @IMGRELPRIORS @ Medications:    . sodium chloride   Intravenous Once  . amLODipine  10 mg Oral Daily  . aspirin EC  81 mg Oral Daily  . atorvastatin  20 mg Oral Daily  . carvedilol  12.5 mg Oral BID  . chlorhexidine  15 mL Mouth Rinse BID  . Chlorhexidine Gluconate Cloth  6 each Topical Q0600  . cloNIDine  0.2 mg Oral BID  . heparin  5,000 Units Subcutaneous Q8H  . hydrALAZINE  75 mg Oral Q8H  . insulin aspart  0-9 Units Subcutaneous TID WC  . mouth rinse  15 mL Mouth Rinse q12n4p  . mycophenolate  1,000 mg Oral BID  . omega-3 acid ethyl esters  1 g Oral Daily  . predniSONE  5 mg Oral Q breakfast  . sodium chloride flush  3 mL Intravenous Q12H  . sodium chloride flush  3 mL Intravenous Q12H  . tacrolimus  2 mg Oral Q24H  . tacrolimus  3 mg Oral Q24H     Madelon Lips, MD 05/12/2018, 1:43 PM

## 2018-05-12 NOTE — Progress Notes (Signed)
Upper extremity vein mapping completed. Refer to "CV Proc" under chart review to view preliminary results.  05/12/2018  Maudry Mayhew, MHA, RVT, RDCS, RDMS

## 2018-05-12 NOTE — Progress Notes (Signed)
Pt arrived back to room from dialysis. Will continue to monitor pt. Lutricia Widjaja Thacker, RN 

## 2018-05-12 NOTE — Progress Notes (Signed)
HD tx initiated via HD cath w/o problem Bilat ports: pull/push/flush equally w/o problem VSS Will continue to monitor while on HD tx 

## 2018-05-12 NOTE — Progress Notes (Signed)
Pt placing self on CPAP- Machine at bedside and pt wearing

## 2018-05-12 NOTE — Progress Notes (Signed)
  Echocardiogram 2D Echocardiogram has been performed.  [name] G [name] 05/12/2018, 10:47 AM

## 2018-05-12 NOTE — Progress Notes (Signed)
PROGRESS NOTE    [name]  VEH:[id number] DOB: 1976-04-14 DOA: 05/10/2018 PCP: Luetta Nutting, DO    Brief Narrative:  Patient is a 42 year old gentleman history of diabetes, hypertension, DD RT from [place] in 2014 after having been on HD short-term PD x7 years initially followed by [facility], history of obstructive sleep apnea, poorly controlled hypertension, morbid obesity who has had progressive worsening of renal function with last creatinine noted at 4.8 on 03/03/2018 at [facility].  Patient with history of transplant renal biopsy June 2019 specimen deemed inadequate.  Patient presented to the ED with facial swelling and increasing lower extremity swelling over the past week with a 20 pound weight gain.  Patient seen in the ED with hypertensive urgency/emergency, acute on chronic kidney disease stage IV with creatinine of 18.64.  Patient placed on high-dose IV Lasix, oral antihypertensive medications, nicardipine drip.  Nephrology consulted and patient for tunneled HD catheter in anticipation of hemodialysis.    Subjective: -Patient reports some headache, he had couple episodes of vomiting today as well  Assessment & Plan:   Principal Problem:   Acute kidney injury superimposed on CKD ([place]) Active Problems:   OSA (obstructive sleep apnea)   Essential hypertension   Immunosuppression ([place])   Morbid obesity ([place])   Kidney replaced by transplant   Type 2 diabetes mellitus without complication, without long-term current use of insulin ([facility])   Hypertensive urgency   Acidosis   Volume overload   Anemia   Hypertensive emergency  AKI on CKD stage IV  -Creatinine was 4.8 in September of this year, asked on admission, he was dialyzed overnight, as well achieving another session this afternoon. -Management per renal, back on hemodialysis -Patient with renal biopsy June of this year, inadequate tissue, per renal there is a strong suspicion for secondary FSGS obesity with a  UPC of 6 -Patient had tunneled hemodialysis catheter in right groin per IR, vascular surgery following for permanent access.  Renal transplant patient -Continue with immunosuppressive therapy   Hypertensive emergency/urgency -Reports some headache and vomiting this morning as well, blood pressure significantly uncontrolled, discussed with renal, who did prioritize his dialysis today, despite that blood pressure remains uncontrolled, with amlodipine 10 mg daily, Coreg maximized at 25 mg daily, hydralazine has maxed at 100 mg oral 3 times daily, started on clonidine 0.2 mg twice daily, he is on PRN hydralazine pushes as well, patient reports his blood pressure usually in the 170s to 180s at home, so I would like to avoid significant sudden drop of blood pressure, but if this remains an issue likely will need to be started on nicardipine drip.  Anemia Patient with no overt bleeding.  Hemoglobin down to 7.7 today, he did have some bleed from his hemodialysis catheter yesterday, but appears to be stable currently, transfuse 1 unit PRBC today, likely will need further procedures for access by vascular surgery  Acidosis Secondary to failure.  Patient to have tunneled HD catheter placed today in anticipation of starting hemodialysis today.  Per nephrology.  Well controlled diabetes mellitus 2 Hemoglobin A1c at 5.8.  CBG of 86 this morning.  Continue to hold home oral hypoglycemic agents.  Continue sliding scale insulin.   Hyperlipidemia LDL elevated at 189   Continue home regimen statin.  Morbid obesity  Obstructive sleep apnea CPAP nightly.  DVT prophylaxis: Heparin Code Status: Full Family Communication: Updated patient.  No family at bedside. Disposition Plan: Likely home when medically stable clinically improved and per nephrology.  Consultants:   Nephrology: Dr. Augustin Coupe 05/10/2018  Vascular interventional radiology Candiss Norse, PA 05/11/2018  Procedures:   Ultrasound of  transplanted kidney 05/10/2018 Chest x-ray 05/10/2018    Antimicrobials:   None      Objective: Vitals:   05/12/18 1520 05/12/18 1530 05/12/18 1540 05/12/18 1600  BP: (!) 196/100 (!) 181/114 (!) 158/104 (!) 194/120  Pulse: 92 92 90 89  Resp: 19   19  Temp: (!) 97.3 F (36.3 C)   (!) 97.4 F (36.3 C)  TempSrc: Oral   Oral  SpO2:      Weight:      Height:        Intake/Output Summary (Last 24 hours) at 05/12/2018 1644 Last data filed at 05/12/2018 1600 Gross per 24 hour  Intake 970 ml  Output 3500 ml  Net -2530 ml   Filed Weights   05/12/18 0016 05/12/18 0340 05/12/18 1316  Weight: (!) 166.6 kg (!) 163.6 kg (!) 161.2 kg    Examination:  Awake Alert, Oriented X 3, No new F.N deficits, Normal affect Symmetrical Chest wall movement, Good air movement bilaterally, bibasilar crackles RRR,No Gallops,Rubs or new Murmurs, No Parasternal Heave +ve B.Sounds, Abd Soft, No tenderness, No rebound - guarding or rigidity. No Cyanosis, Clubbing, +1 edema bilaterally  No new Rash or bruise, right groin temporary hematosis catheter, with no bruising or bleeding from site      Data Reviewed: I have personally reviewed following labs and imaging studies  CBC: Recent Labs  Lab 05/10/18 1521 05/10/18 2119 05/11/18 0344 05/12/18 0051  WBC 7.8 7.8 8.6 7.9  NEUTROABS 6.4  --   --   --   HGB 8.1* 8.7* 8.4* 7.7*  HCT 26.6* 28.2* 27.8* 25.9*  MCV 82.9 82.7 82.7 84.1  PLT 141* 168 167 010   Basic Metabolic Panel: Recent Labs  Lab 05/10/18 1521 05/10/18 2119 05/11/18 0344 05/12/18 0051  NA 138 139 140 140  K 4.0 4.4 4.1 4.3  CL 101 105 106 107  CO2 16* 15* 14* 15*  GLUCOSE 126* 102* 133* 149*  BUN 128* 126* 135* 139*  CREATININE 18.64* 17.81* 18.58* 19.30*  CALCIUM 7.5* 7.3* 7.5* 7.6*  PHOS  --  >12.0* 11.2* 14.1*   GFR: Estimated Creatinine Clearance: 7.9 mL/min (A) (by C-G formula based on SCr of 19.3 mg/dL (H)). Liver Function Tests: Recent Labs  Lab  05/10/18 1521 05/10/18 2119 05/11/18 0050 05/11/18 0344 05/12/18 0051  AST 10*  --   --  8*  --   ALT 12  --  10 11  --   ALKPHOS 38  --   --  36*  --   BILITOT 0.6  --   --  0.6  --   PROT 5.8*  --   --  5.6*  --   ALBUMIN 2.5* 2.5*  --  2.7* 2.3*   No results for input(s): LIPASE, AMYLASE in the last 168 hours. No results for input(s): AMMONIA in the last 168 hours. Coagulation Profile: Recent Labs  Lab 05/10/18 2119 05/11/18 0344  INR 1.16 1.21   Cardiac Enzymes: No results for input(s): CKTOTAL, CKMB, CKMBINDEX, TROPONINI in the last 168 hours. BNP (last 3 results) No results for input(s): PROBNP in the last 8760 hours. HbA1C: Recent Labs    05/11/18 0235  HGBA1C 5.8*   CBG: Recent Labs  Lab 05/11/18 1210 05/11/18 1850 05/11/18 2123 05/12/18 0806 05/12/18 1233  GLUCAP 84 85 81 87 117*   Lipid Profile: Recent  Labs    05/12/18 0536  CHOL 266*  HDL 45  LDLCALC 189*  TRIG 159*  CHOLHDL 5.9   Thyroid Function Tests: Recent Labs    05/11/18 1308  TSH 2.619   Anemia Panel: Recent Labs    05/10/18 2119 05/11/18 0235  VITAMINB12 198  --   FOLATE 9.0  --   FERRITIN 1,154* 1,172*  TIBC 168* 183*  IRON 103 108  RETICCTPCT 1.1  --    Sepsis Labs: No results for input(s): PROCALCITON, LATICACIDVEN in the last 168 hours.  Recent Results (from the past 240 hour(s))  Urine Culture     Status: None   Collection Time: 05/10/18  6:14 PM  Result Value Ref Range Status   Specimen Description URINE, CLEAN CATCH  Final   Special Requests NONE  Final   Culture   Final    NO GROWTH Performed at [facility], [address] [place], [place] [address]    Report Status 05/11/2018 FINAL  Final  Culture, blood (Routine X 2) w Reflex to ID Panel     Status: None (Preliminary result)   Collection Time: 05/11/18 11:32 AM  Result Value Ref Range Status   Specimen Description BLOOD BLOOD LEFT FOREARM  Final   Special Requests   Final    BOTTLES DRAWN  AEROBIC ONLY Blood Culture adequate volume   Culture   Final    NO GROWTH < 24 HOURS Performed at [facility], [place] [address] [place], [place] [address]    Report Status PENDING  Incomplete  Culture, blood (Routine X 2) w Reflex to ID Panel     Status: None (Preliminary result)   Collection Time: 05/11/18  6:24 PM  Result Value Ref Range Status   Specimen Description BLOOD LEFT ANTECUBITAL  Final   Special Requests   Final    BOTTLES DRAWN AEROBIC ONLY Blood Culture results may not be optimal due to an inadequate volume of blood received in culture bottles   Culture   Final    NO GROWTH < 24 HOURS Performed at [facility], [place] [address], [place], [place] [address]    Report Status PENDING  Incomplete  MRSA PCR Screening     Status: None   Collection Time: 05/11/18  6:30 PM  Result Value Ref Range Status   MRSA by PCR NEGATIVE NEGATIVE Final    Comment:        The GeneXpert MRSA Assay (FDA approved for NASAL specimens only), is one component of a comprehensive MRSA colonization surveillance program. It is not intended to diagnose MRSA infection nor to guide or monitor treatment for MRSA infections. Performed at [facility], [place] [address] [place], [place] [address]          Radiology Studies: US Renal Transplant W/doppler  Result Date: 05/10/2018 CLINICAL DATA:  Acute renal failure.  Renal transplant. EXAM: ULTRASOUND OF RENAL TRANSPLANT WITH RENAL DOPPLER ULTRASOUND TECHNIQUE: Ultrasound examination of the renal transplant was performed with gray-scale, color and duplex doppler evaluation. COMPARISON:  None. FINDINGS: Transplant kidney location: Right lower quadrant Transplant Kidney: Renal measurements: 12.6 x 4.8 x 3.8 cm = volume: 118.85m. Normal in size and parenchymal echogenicity. No evidence of mass or hydronephrosis. Color flow in the main renal artery:  Yes Color flow in the main renal vein:  Yes Duplex Doppler Evaluation: Main Renal Artery  Resistive Index: 0.82 Venous waveform in main renal vein:  Present Intrarenal resistive index in  upper pole:  0.79 (normal 0.6-0.8; equivocal 0.8-0.9; abnormal >= 0.9) Intrarenal resistive index in lower pole: 0.56 (normal 0.6-0.8; equivocal 0.8-0.9; abnormal >= 0.9) Bladder: Not visualized Other findings: There is a 7.1 x 5.6 x 4.8 complex fluid collection noted in the right lower quadrant adjacent to the lower pole of the transplant kidney. This was noted on prior outside study done at [facility] from 02/23/2013 when this measured up to 8.6 cm. IMPRESSION: Right lower quadrant renal transplant. No acute findings. No hydronephrosis. 7.1 cm complex fluid collection in the right lower quadrant adjacent to the lower pole of the transplant kidney. This has decreased in size when compared to outside report from [facility] ultrasound performed 02/23/2013. Electronically Signed   By: Rolm Baptise M.D.   On: 05/10/2018 21:47   Ir Fluoro Guide Cv Line Right  Result Date: 05/11/2018 INDICATION: History of end-stage renal disease now with failed renal transplant. Patient with multiple previous tunneled dialysis catheters and concern for upper extremity central venous occlusion demonstrated on remote declot performed 02/17/2018 and as patient previously dialyzed via a right femoral approach dialysis catheter prior to receiving his renal transplant. EXAM: TUNNELED CENTRAL VENOUS HEMODIALYSIS CATHETER PLACEMENT WITH ULTRASOUND AND FLUOROSCOPIC GUIDANCE MEDICATIONS: Ancef 1 gm IV . The antibiotic was given in an appropriate time interval prior to skin puncture. ANESTHESIA/SEDATION: Versed 1.5 mg IV; Fentanyl 100 mcg IV; Moderate Sedation Time:  30 minutes The patient was continuously monitored during the procedure by the interventional radiology nurse under my direct supervision. FLUOROSCOPY TIME:  2 minutes, 24 seconds (41 mGy) COMPLICATIONS: None immediate. PROCEDURE: Informed written consent was obtained from the patient after a  discussion of the risks, benefits, and alternatives to treatment. Questions regarding the procedure were encouraged and answered. Given above, decision was made to proceed with a tunneled right common femoral approach dialysis catheter. Sonographic evaluation demonstrated persistent patency of the right common femoral vein. As such, the right groin and superolateral aspect the right thigh was prepped with chlorhexidine in a sterile fashion, and a sterile drape was applied covering the operative field. Maximum barrier sterile technique with sterile gowns and gloves were used for the procedure. A timeout was performed prior to the initiation of the procedure. After creating a small venotomy incision, a micropuncture kit was utilized to access the right common femoral vein. Real-time ultrasound guidance was utilized for vascular access including the acquisition of a permanent ultrasound image documenting patency of the accessed vessel. A stiff glidewire was utilized for measurement purposes Next, the micropuncture sheath was exchanged for a peel-away sheath under fluoroscopic guidance. A asked split tunneled hemodialysis catheter measuring 55 cm from tip to cuff was tunneled in a retrograde fashion from the anterolateral aspect the right thigh to the venotomy incision. The catheter was then placed through the peel-away sheath with tips ultimately positioned within the perihepatic IVC. Given concern for slightly caudal positioning of the tips of the dialysis catheter, the decision was made to create a shorter length subcutaneous track. Note, despite measurement, patient has a large amount of redundant tissue about the thigh which resulted in suboptimal catheter tip positioning. As such, a more proximal location on the patient's anterolateral aspect right thigh was selected and utilized to tunnel a new 55 cm dialysis catheter to the venotomy incision. Existing dialysis catheter was retracted from its access site at the  venotomy site. The peripheral aspect of the dialysis catheter was cut, the peripheral component was removed. A stiff glidewire was utilized to cannulate the remainder  of the remaining portion of the existing dialysis catheter and advanced to the level of the inferior cavoatrial junction. Next, the remaining portion of the initial dialysis catheter was exchanged for a new the low a sheath under fluoroscopic guidance. Finally, the new 55 cm tip to cuff dialysis catheter was inserted through the peel-away sheath with tip ultimately terminating within the inferior cavoatrial junction. Final catheter positioning was confirmed and documented with a spot fluoroscopic images. The catheter aspirates and flushes normally. The catheter was flushed with appropriate volume heparin dwells. The catheter exit site was secured with a 0-Prolene retention suture. The venotomy incision was closed with Dermabond and Steri-strips. Dressings were applied. The patient tolerated the procedure well without immediate post procedural complication. IMPRESSION: Successful placement of 55 cm tip to cuff tunneled hemodialysis catheter via the right common femoral vein vein with tips terminating within the inferior cavoatrial junction. The catheter is ready for immediate use. Electronically Signed   By: Sandi Mariscal M.D.   On: 05/11/2018 17:46   Ir US Guide Vasc Access Right  Result Date: 05/11/2018 INDICATION: History of end-stage renal disease now with failed renal transplant. Patient with multiple previous tunneled dialysis catheters and concern for upper extremity central venous occlusion demonstrated on remote declot performed 02/17/2018 and as patient previously dialyzed via a right femoral approach dialysis catheter prior to receiving his renal transplant. EXAM: TUNNELED CENTRAL VENOUS HEMODIALYSIS CATHETER PLACEMENT WITH ULTRASOUND AND FLUOROSCOPIC GUIDANCE MEDICATIONS: Ancef 1 gm IV . The antibiotic was given in an appropriate time  interval prior to skin puncture. ANESTHESIA/SEDATION: Versed 1.5 mg IV; Fentanyl 100 mcg IV; Moderate Sedation Time:  30 minutes The patient was continuously monitored during the procedure by the interventional radiology nurse under my direct supervision. FLUOROSCOPY TIME:  2 minutes, 24 seconds (41 mGy) COMPLICATIONS: None immediate. PROCEDURE: Informed written consent was obtained from the patient after a discussion of the risks, benefits, and alternatives to treatment. Questions regarding the procedure were encouraged and answered. Given above, decision was made to proceed with a tunneled right common femoral approach dialysis catheter. Sonographic evaluation demonstrated persistent patency of the right common femoral vein. As such, the right groin and superolateral aspect the right thigh was prepped with chlorhexidine in a sterile fashion, and a sterile drape was applied covering the operative field. Maximum barrier sterile technique with sterile gowns and gloves were used for the procedure. A timeout was performed prior to the initiation of the procedure. After creating a small venotomy incision, a micropuncture kit was utilized to access the right common femoral vein. Real-time ultrasound guidance was utilized for vascular access including the acquisition of a permanent ultrasound image documenting patency of the accessed vessel. A stiff glidewire was utilized for measurement purposes Next, the micropuncture sheath was exchanged for a peel-away sheath under fluoroscopic guidance. A asked split tunneled hemodialysis catheter measuring 55 cm from tip to cuff was tunneled in a retrograde fashion from the anterolateral aspect the right thigh to the venotomy incision. The catheter was then placed through the peel-away sheath with tips ultimately positioned within the perihepatic IVC. Given concern for slightly caudal positioning of the tips of the dialysis catheter, the decision was made to create a shorter length  subcutaneous track. Note, despite measurement, patient has a large amount of redundant tissue about the thigh which resulted in suboptimal catheter tip positioning. As such, a more proximal location on the patient's anterolateral aspect right thigh was selected and utilized to tunnel a new 55 cm dialysis catheter  to the venotomy incision. Existing dialysis catheter was retracted from its access site at the venotomy site. The peripheral aspect of the dialysis catheter was cut, the peripheral component was removed. A stiff glidewire was utilized to cannulate the remainder of the remaining portion of the existing dialysis catheter and advanced to the level of the inferior cavoatrial junction. Next, the remaining portion of the initial dialysis catheter was exchanged for a new the low a sheath under fluoroscopic guidance. Finally, the new 55 cm tip to cuff dialysis catheter was inserted through the peel-away sheath with tip ultimately terminating within the inferior cavoatrial junction. Final catheter positioning was confirmed and documented with a spot fluoroscopic images. The catheter aspirates and flushes normally. The catheter was flushed with appropriate volume heparin dwells. The catheter exit site was secured with a 0-Prolene retention suture. The venotomy incision was closed with Dermabond and Steri-strips. Dressings were applied. The patient tolerated the procedure well without immediate post procedural complication. IMPRESSION: Successful placement of 55 cm tip to cuff tunneled hemodialysis catheter via the right common femoral vein vein with tips terminating within the inferior cavoatrial junction. The catheter is ready for immediate use. Electronically Signed   By: Sandi Mariscal M.D.   On: 05/11/2018 17:46   Vas [place] Upper Ext Vein Mapping (pre-op Avf)  Result Date: 05/12/2018 UPPER EXTREMITY VEIN MAPPING  Indications: Pre-access. Limitations: patient body habitus, hypersomnolence Performing Technologist:  Maudry Mayhew MHA, RDMS, RVT, RDCS  Examination Guidelines: A complete evaluation includes B-mode imaging, spectral Doppler, color Doppler, and power Doppler as needed of all accessible portions of each vessel. Bilateral testing is considered an integral part of a complete examination. Limited examinations for reoccurring indications may be performed as noted. +-----------------+-------------+----------+--------------+ Right Cephalic   Diameter (cm)Depth (cm)   Findings    +-----------------+-------------+----------+--------------+ Shoulder             0.24        1.46      thrombus    +-----------------+-------------+----------+--------------+ Prox upper arm       0.25        1.50      thrombus    +-----------------+-------------+----------+--------------+ Mid upper arm        0.37        1.17      thrombus    +-----------------+-------------+----------+--------------+ Dist upper arm       0.27        0.65      thrombus    +-----------------+-------------+----------+--------------+ Antecubital fossa                       not visualized +-----------------+-------------+----------+--------------+ Prox forearm                            not visualized +-----------------+-------------+----------+--------------+ Mid forearm                             not visualized +-----------------+-------------+----------+--------------+ Dist forearm                            not visualized +-----------------+-------------+----------+--------------+ Wrist                                   not visualized +-----------------+-------------+----------+--------------+ +-----------------+-------------+----------+--------------+ Right Basilic    Diameter (cm)Depth (cm)  Findings    +-----------------+-------------+----------+--------------+ Shoulder                                not visualized +-----------------+-------------+----------+--------------+ Prox upper arm                           not visualized +-----------------+-------------+----------+--------------+ Mid upper arm                           not visualized +-----------------+-------------+----------+--------------+ Dist upper arm                          not visualized +-----------------+-------------+----------+--------------+ Antecubital fossa                       not visualized +-----------------+-------------+----------+--------------+ Prox forearm                            not visualized +-----------------+-------------+----------+--------------+ Mid forearm                             not visualized +-----------------+-------------+----------+--------------+ Distal forearm                          not visualized +-----------------+-------------+----------+--------------+ Elbow                                   not visualized +-----------------+-------------+----------+--------------+ Wrist                                   not visualized +-----------------+-------------+----------+--------------+ Varicose veins seen in proximal upper arm +-----------------+-------------+----------+---------+ Left Cephalic    Diameter (cm)Depth (cm)Findings  +-----------------+-------------+----------+---------+ Shoulder             0.31        1.63             +-----------------+-------------+----------+---------+ Prox upper arm       0.36        1.07             +-----------------+-------------+----------+---------+ Mid upper arm        0.34        0.92             +-----------------+-------------+----------+---------+ Dist upper arm       0.21        0.64   branching +-----------------+-------------+----------+---------+ Antecubital fossa    0.55        0.66   thrombus  +-----------------+-------------+----------+---------+ Prox forearm         0.31        0.81             +-----------------+-------------+----------+---------+ Mid forearm           0.37        0.77             +-----------------+-------------+----------+---------+ Dist forearm         0.34        0.58   branching +-----------------+-------------+----------+---------+ Wrist                0.35  0.50             +-----------------+-------------+----------+---------+ +-----------------+-------------+----------+--------------+ Left Basilic     Diameter (cm)Depth (cm)   Findings    +-----------------+-------------+----------+--------------+ Shoulder                                not visualized +-----------------+-------------+----------+--------------+ Prox upper arm                          not visualized +-----------------+-------------+----------+--------------+ Mid upper arm                           not visualized +-----------------+-------------+----------+--------------+ Dist upper arm                          not visualized +-----------------+-------------+----------+--------------+ Antecubital fossa                       not visualized +-----------------+-------------+----------+--------------+ Prox forearm                            not visualized +-----------------+-------------+----------+--------------+ Mid forearm                             not visualized +-----------------+-------------+----------+--------------+ Distal forearm                          not visualized +-----------------+-------------+----------+--------------+ Elbow                                   not visualized +-----------------+-------------+----------+--------------+ Wrist                                   not visualized +-----------------+-------------+----------+--------------+ *See table(s) above for measurements and observations.  Diagnosing physician:    Preliminary         Scheduled Meds: . sodium chloride   Intravenous Once  . amLODipine  10 mg Oral Daily  . aspirin EC  81 mg Oral Daily  . atorvastatin  20 mg Oral Daily   . carvedilol  25 mg Oral BID  . chlorhexidine  15 mL Mouth Rinse BID  . Chlorhexidine Gluconate Cloth  6 each Topical Q0600  . cloNIDine  0.1 mg Oral Once in dialysis  . cloNIDine  0.2 mg Oral BID  . heparin      . heparin  5,000 Units Subcutaneous Q8H  . hydrALAZINE  100 mg Oral Q8H  . insulin aspart  0-9 Units Subcutaneous TID WC  . mouth rinse  15 mL Mouth Rinse q12n4p  . mycophenolate  1,000 mg Oral BID  . omega-3 acid ethyl esters  1 g Oral Daily  . predniSONE  5 mg Oral Q breakfast  . sodium chloride flush  3 mL Intravenous Q12H  . sodium chloride flush  3 mL Intravenous Q12H  . tacrolimus  1 mg Oral BID   Continuous Infusions: . sodium chloride    . sodium chloride    . sodium chloride       LOS: 2 days        Phillips Climes, MD [facility] Pager  [phone] If 7PM-7AM, please contact night-coverage [email or web address] Password [email or web address] 05/12/2018, 4:44 PM

## 2018-05-12 NOTE — Progress Notes (Signed)
OT Cancellation Note  Patient Details Name: [name] MRN: [id number] DOB: 1976-02-16   Cancelled Treatment:    Reason Eval/Treat Not Completed: Medical issues which prohibited therapy  Pt's RN reporting that pt has been vomiting all morning with elevated BP's. OT will hold and continue to f/u with pt acutely as available and appropriate.     [place], Mickel Baas, OT Acute Rehabilitation Services [phone] [phone]    05/12/2018, 11:03 AM

## 2018-05-12 NOTE — Progress Notes (Signed)
PT Cancellation Note  Patient Details Name: [name] MRN: [id number] DOB: [birth year]   Cancelled Treatment:    Reason Eval/Treat Not Completed: Patient at procedure or test/unavailable (HD). PT will continue to follow acutely as available and appropriate.    [place] 05/12/2018, 2:03 PM

## 2018-05-12 NOTE — Progress Notes (Signed)
   05/12/18 0837 05/12/18 0932  Vitals  BP (!) 194/109 (!) 202/100   Upon assessment at 0837, BP 194/109. Scheduled Amlodipine and Coreg administered as well as PRN Hydralazine 10mg  IV.  Around 0930, patient complaining of headache and BP remains elevated at 202/100. Dr. Waldron Labs notified and to put in orders. Will continue to monitor.

## 2018-05-12 NOTE — Progress Notes (Signed)
HD tx completed @ 0335 w/o problem UF goal met Blood rinsed back VSS Report called to Ranelle Oyster, RN

## 2018-05-12 NOTE — Progress Notes (Signed)
PT Cancellation Note  Patient Details Name: [name] MRN: [id number] DOB: 03-27-1976   Cancelled Treatment:    Reason Eval/Treat Not Completed: Medical issues which prohibited therapy. Pt's RN reporting that pt has been vomiting all morning with elevated BP's. PT will hold and continue to f/u with pt acutely as available and appropriate.    [place] 05/12/2018, 11:01 AM

## 2018-05-13 ENCOUNTER — Encounter (HOSPITAL_COMMUNITY)
Admission: EM | Disposition: A | Discharge status: Home / Self Care | Payer: Self-pay | Source: Home / Self Care | Attending: Internal Medicine

## 2018-05-13 ENCOUNTER — Encounter (HOSPITAL_COMMUNITY): Payer: Self-pay | Admitting: Vascular Surgery

## 2018-05-13 HISTORY — PX: UPPER EXTREMITY ANGIOGRAPHY: CATH118270

## 2018-05-13 LAB — TYPE AND SCREEN
ABO/RH(D): A POS
Antibody Screen: NEGATIVE
Unit division: 0

## 2018-05-13 LAB — RENAL FUNCTION PANEL
Albumin: 2.3 g/dL — ABNORMAL LOW (ref 3.5–5.0)
Anion gap: 13 (ref 5–15)
BUN: 62 mg/dL — ABNORMAL HIGH (ref 6–20)
CO2: 24 mmol/L (ref 22–32)
Calcium: 8.2 mg/dL — ABNORMAL LOW (ref 8.9–10.3)
Chloride: 103 mmol/L (ref 98–111)
Creatinine, Ser: 12.24 mg/dL — ABNORMAL HIGH (ref 0.61–1.24)
GFR calc Af Amer: 5 mL/min — ABNORMAL LOW (ref >60)
GFR calc non Af Amer: 4 mL/min — ABNORMAL LOW (ref >60)
Glucose, Bld: 114 mg/dL — ABNORMAL HIGH (ref 70–99)
PHOSPHORUS: 9.8 mg/dL — AB (ref 2.5–4.6)
POTASSIUM: 3.8 mmol/L (ref 3.5–5.1)
Sodium: 140 mmol/L (ref 135–145)

## 2018-05-13 LAB — BPAM RBC
Blood Product Expiration Date: 201912282359
ISSUE DATE / TIME: 201912041503
Unit Type and Rh: 6200

## 2018-05-13 LAB — CBC
HEMATOCRIT: 28.5 % — AB (ref 39.0–52.0)
HEMOGLOBIN: 8.6 g/dL — AB (ref 13.0–17.0)
MCH: 24.9 pg — ABNORMAL LOW (ref 26.0–34.0)
MCHC: 30.2 g/dL (ref 30.0–36.0)
MCV: 82.6 fL (ref 80.0–100.0)
Platelets: 157 10*3/uL (ref 150–400)
RBC: 3.45 MIL/uL — ABNORMAL LOW (ref 4.22–5.81)
RDW: 14.7 % (ref 11.5–15.5)
WBC: 6.5 10*3/uL (ref 4.0–10.5)
nRBC: 0 % (ref 0.0–0.2)

## 2018-05-13 LAB — GLUCOSE, CAPILLARY
GLUCOSE-CAPILLARY: 106 mg/dL — AB (ref 70–99)
Glucose-Capillary: 105 mg/dL — ABNORMAL HIGH (ref 70–99)
Glucose-Capillary: 87 mg/dL (ref 70–99)
Glucose-Capillary: 129 mg/dL — ABNORMAL HIGH (ref 70–99)

## 2018-05-13 LAB — HEPATITIS B SURFACE ANTIBODY,QUALITATIVE: Hep B S Ab: REACTIVE

## 2018-05-13 LAB — TACROLIMUS LEVEL: Tacrolimus (FK506) - LabCorp: 5.2 ng/mL (ref 2.0–20.0)

## 2018-05-13 LAB — HEPATITIS B CORE ANTIBODY, TOTAL: Hep B Core Total Ab: NEGATIVE

## 2018-05-13 SURGERY — ARTERIOVENOUS (AV) FISTULA CREATION
Anesthesia: Monitor Anesthesia Care | Laterality: Left

## 2018-05-13 SURGERY — UPPER EXTREMITY ANGIOGRAPHY
Anesthesia: LOCAL | Laterality: Bilateral

## 2018-05-13 MED ORDER — HEPARIN SODIUM (PORCINE) 1000 UNIT/ML IJ SOLN
INTRAMUSCULAR | Status: AC
  Administered 2018-05-13: 5500 [IU] via INTRAVENOUS_CENTRAL
  Filled 2018-05-13: qty 6

## 2018-05-13 MED ORDER — IODIXANOL 320 MG/ML IV SOLN
INTRAVENOUS | Status: DC | PRN
  Administered 2018-05-13: 35 mL via INTRAVENOUS

## 2018-05-13 SURGICAL SUPPLY — 2 items
STOPCOCK MORSE 400PSI 3WAY (MISCELLANEOUS) ×3 IMPLANT
TUBING CIL FLEX 10 FLL-RA (TUBING) ×2 IMPLANT

## 2018-05-13 NOTE — Progress Notes (Signed)
[facility] Progress Note   Assessment/ Plan:   1.  AKI on CKD4 with a creatinine that was already 4.8 in 03/03/2018 with inadequate tissue from a tx renal bx 11/2017. 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 Likely has progressed to ESRD.  [place] placed with IR 12/3, VVS to place access today and appreciate assistance.  Tac trough pending.  Obstruction not likely, PVR 23.  Awaiting callback from [place] transplant, UP/C pending.    2. DDRT with h/o DGF @ [facility]- prednisone 5 mg daily, MMF 1000 BID, tac 3/2 BID--> tac trough pending, I empirically reduced tac to 1/1 in setting of AKI.  Called [facility] 12/3, will contact again.  4. Hypertensive urgency/emergency- pressures initially better on home meds but had recurrence of hypertensive urgency/ emergency.  HD will help this and suspect a large part d/t progressive vol OL; coreg increased.  Hydralazine increased as well, also on clonidine and amlodipine.  Of note, he was on losartan as OP- hold for now, may need to add back (would do this once we are sure there is no chance for renal recovery)  5. DM  6. Secondary HPT- check PTH  6. Chronic Anemia- getting 1 u pRBCs today during HD, will start ESA   7. OSA- needs to be adherent to CPAP  Subjective:    S/p TDC with IR yesterday, first dialysis.  Tolerated well.  Hwoever, this AM having headaches, vomiting, high BP despite multiple IV hydralazine pushes (30 mg IV so far).     Objective:   BP (!) 153/95 (BP Location: Right Arm)   Pulse 89   Temp (!) 97.3 F (36.3 C) (Oral)   Resp (!) 26   Ht 6\' 2"  (1.88 m)   Wt (!) 159.1 kg   SpO2 (!) 86%   BMI 45.03 kg/m   Physical Exam: Gen: obese, appears uncomfortable CVS: RRR Resp: fine crackles at bases Abd: obese Ext: 2+ LE edema  Labs: BMET Recent Labs  Lab 05/10/18 1521 05/10/18 2119 05/11/18 0344 05/12/18 0051 05/12/18 1910 05/13/18 0754  NA 138 139 140 140 139 140  K 4.0 4.4 4.1 4.3 3.5 3.8  CL  101 105 106 107 101 103  CO2 16* 15* 14* 15* 27 24  GLUCOSE 126* 102* 133* 149* 148* 114*  BUN 128* 126* 135* 139* 56* 62*  CREATININE 18.64* 17.81* 18.58* 19.30* 10.58* 12.24*  CALCIUM 7.5* 7.3* 7.5* 7.6* 8.0* 8.2*  PHOS  --  >12.0* 11.2* 14.1* 7.4* 9.8*   CBC Recent Labs  Lab 05/10/18 1521  05/11/18 0344 05/12/18 0051 05/12/18 1910 05/13/18 0754  WBC 7.8   < > 8.6 7.9 6.8 6.5  NEUTROABS 6.4  --   --   --   --   --   HGB 8.1*   < > 8.4* 7.7* 8.8* 8.6*  HCT 26.6*   < > 27.8* 25.9* 27.5* 28.5*  MCV 82.9   < > 82.7 84.1 81.4 82.6  PLT 141*   < > 167 164 161 157   < > = values in this interval not displayed.    @IMGRELPRIORS @ Medications:    . sodium chloride   Intravenous Once  . amLODipine  10 mg Oral Daily  . aspirin EC  81 mg Oral Daily  . atorvastatin  20 mg Oral Daily  . carvedilol  25 mg Oral BID  . chlorhexidine  15 mL Mouth Rinse BID  . Chlorhexidine Gluconate Cloth  6  each Topical [id number]  . cloNIDine  0.2 mg Oral TID  . heparin  5,000 Units Subcutaneous Q8H  . hydrALAZINE  100 mg Oral Q8H  . insulin aspart  0-9 Units Subcutaneous TID WC  . mouth rinse  15 mL Mouth Rinse q12n4p  . mycophenolate  1,000 mg Oral BID  . omega-3 acid ethyl esters  1 g Oral Daily  . predniSONE  5 mg Oral Q breakfast  . sodium chloride flush  3 mL Intravenous Q12H  . sodium chloride flush  3 mL Intravenous Q12H  . tacrolimus  1 mg Oral BID     Madelon Lips, MD 05/13/2018, 9:50 AM

## 2018-05-13 NOTE — Plan of Care (Signed)
  Problem: Education: Goal: Knowledge of General Education information will improve Description Including pain rating scale, medication(s)/side effects and non-pharmacologic comfort measures Outcome: Progressing   Problem: Clinical Measurements: Goal: Will remain free from infection Outcome: Progressing   Problem: Nutrition: Goal: Adequate nutrition will be maintained Outcome: Progressing   Problem: Coping: Goal: Level of anxiety will decrease Outcome: Progressing   

## 2018-05-13 NOTE — Evaluation (Signed)
Occupational Therapy Evaluation Patient Details Name: [name] MRN: [id number] DOB: 1976/01/28 Today's Date: 05/13/2018    History of Present Illness This 42 y.o. male admitted with facial swelling, and increased LE edema with 20lb weight gain.  He was found to have HTN urgency/emergency, acute on chronic kidney disease stage IV.  PMH includes:  DM, HTN, h/o kidney transplant 2014, OSA, morbid obesity,    Clinical Impression   Patient evaluated by Occupational Therapy with no further acute OT needs identified. All education has been completed and the patient has no further questions. Pt is at or close to baseline, and is able to perform ADLs independently.  See below for any follow-up Occupational Therapy or equipment needs. OT is signing off. Thank you for this referral.        Follow Up Recommendations  No OT follow up    Equipment Recommendations  None recommended by OT    Recommendations for Other Services       Precautions / Restrictions Precautions Precautions: None      Mobility Bed Mobility Overal bed mobility: Independent                Transfers Overall transfer level: Independent                    Balance Overall balance assessment: No apparent balance deficits (not formally assessed)                                         ADL either performed or assessed with clinical judgement   ADL Overall ADL's : Independent                                             Vision Baseline Vision/History: Wears glasses Wears Glasses: At all times Patient Visual Report: No change from baseline       Perception Perception Perception Tested?: Yes   Praxis Praxis Praxis tested?: Within functional limits    Pertinent Vitals/Pain Pain Assessment: No/denies pain     Hand Dominance Right   Extremity/Trunk Assessment Upper Extremity Assessment Upper Extremity Assessment: Overall WFL for tasks assessed    Lower Extremity Assessment Lower Extremity Assessment: Overall WFL for tasks assessed   Cervical / Trunk Assessment Cervical / Trunk Assessment: Normal   Communication Communication Communication: No difficulties   Cognition Arousal/Alertness: Awake/alert Behavior During Therapy: WFL for tasks assessed/performed Overall Cognitive Status: Within Functional Limits for tasks assessed                                     General Comments  02 sats 93 on RA with activity.   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         OT Problem List: Decreased activity tolerance      OT Treatment/Interventions:      OT Goals(Current goals can be found in the care plan section) Acute Rehab OT Goals Patient Stated Goal: to go back to sleep  OT Goal Formulation: All assessment and education complete, DC therapy  OT Frequency:     Barriers to D/C:            Co-evaluation PT/OT/SLP Co-Evaluation/Treatment: Yes Reason for Co-Treatment: For patient/therapist safety(due to pt size)   OT goals addressed during session: ADL's and self-care      AM-PAC OT "6 Clicks" Daily Activity     Outcome Measure Help from another person eating meals?: None Help from another person taking care of personal grooming?: None Help from another person toileting, which includes using toliet, bedpan, or urinal?: None Help from another person bathing (including washing, rinsing, drying)?: None Help from  another person to put on and taking off regular upper body clothing?: None Help from another person to put on and taking off regular lower body clothing?: None 6 Click Score: 24   End of Session Nurse Communication: Mobility status  Activity Tolerance: Patient tolerated treatment well Patient left: in bed;with call bell/phone within reach  OT Visit Diagnosis: Muscle weakness (generalized) (M62.81)                Time: 1245-8099 OT Time Calculation (min): 10 min Charges:  OT General Charges $OT Visit: 1 Visit OT Evaluation $OT Eval Low Complexity: 1 Low  Lucille Passy, OTR/L Acute Rehabilitation Services Pager [phone] Office [phone]   Lucille Passy M 05/13/2018, 1:27 PM

## 2018-05-13 NOTE — Progress Notes (Signed)
Pt declined use of CPAP at this time- machine set up for patient use at bedside.

## 2018-05-13 NOTE — Op Note (Signed)
    OPERATIVE NOTE   PROCEDURE: 1. Bilateral upper extremity venogram  PRE-OPERATIVE DIAGNOSIS: Failed bilateral upper extremity AV access  POST-OPERATIVE DIAGNOSIS: same as above   SURGEON: Marty Heck, MD  ANESTHESIA: local  ESTIMATED BLOOD LOSS: 5 cc  FINDING(S): 1. 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  No further imaging was obtained.  SPECIMEN(S):  None  INDICATIONS: [name] is a 42 y.o. male who  presents with failed bilateral upper extremity access.  Nephrology consulted vascular surgery and asked for evaluation for new access.  We have recommended upper extremity venograms prior to operative planning given failed bilateral upper extremity access and failed left groin access.  The patient is aware the risks include but are not limited to: bleeding, infection, thrombosis of the cannulated access, and possible anaphylactic reaction to the contrast.  The patient is aware of the risks of the procedure and elects to proceed forward.  DESCRIPTION: After full informed written consent was obtained, the patient was brought back to the angiography suite and placed supine upon the angiography table.  The patient was connected to monitoring equipment.  Bilateral upper extremity IV access was obtained.  Initially obtained a right central venogram that showed an abrupt occlusion of the axillary vein with a central venous occlusion and a large collaterals.  We then obtained similar imaging on the left also looking initially at the central structures that also showed a central venous occlusion with abrupt cut off of the axillary vein.  Given bilateral upper extremity central venous occlusions his upper arms are probably not suitable for future access.  As result we will obtain ABIs of his right lower extremity to see about  a right thigh graft.  Patient tolerated the procedure without any immediate complications.  COMPLICATIONS: None  CONDITION: Stable  Marty Heck, MD [facility] Office: [phone] Pager: [phone]  05/13/2018 10:38 AM

## 2018-05-13 NOTE — Evaluation (Signed)
Physical Therapy Evaluation Patient Details Name: [name] MRN: [id number] DOB: [birth year] Today's Date: 05/13/2018   History of Present Illness  This 42 y.o. male admitted with facial swelling, and increased LE edema with 20lb weight gain.  He was found to have HTN urgency/emergency, acute on chronic kidney disease stage IV.  PMH includes:  DM, HTN, h/o kidney transplant 2014, OSA, morbid obesity,   Clinical Impression  Pt is at or close to baseline functioning and should be safe at home alone. There are no further acute PT needs.  Will sign off at this time.     Follow Up Recommendations No PT follow up    Equipment Recommendations  None recommended by PT    Recommendations for Other Services       Precautions / Restrictions Precautions Precautions: None      Mobility  Bed Mobility Overal bed mobility: Independent                Transfers Overall transfer level: Independent                  Ambulation/Gait Ambulation/Gait assistance: Modified independent (Device/Increase time) Gait Distance (Feet): 300 Feet Assistive device: None Gait Pattern/deviations: WFL(Within Functional Limits) Gait velocity: age appropriate Gait velocity interpretation: >2.62 ft/sec, indicative of community ambulatory General Gait Details: Independent able to turn abruptly, back up, scan, change speeds without deviation.  Stairs Stairs: Yes Stairs assistance: Independent Stair Management: No rails;Alternating pattern;Forwards Number of Stairs: 5 General stair comments: safe on the stairs  Wheelchair Mobility    Modified Rankin (Stroke Patients Only)       Balance Overall balance assessment: No apparent balance deficits (not formally assessed)                                           Pertinent Vitals/Pain Pain Assessment: No/denies pain    Home Living Family/patient expects to be discharged to:: Private residence Living Arrangements:  Alone             Home Equipment: None Additional Comments: Pt takes tub baths     Prior Function Level of Independence: Independent         Comments: 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      Hand Dominance   Dominant Hand: Right    Extremity/Trunk Assessment   Upper Extremity Assessment Upper Extremity Assessment: Overall WFL for tasks assessed    Lower Extremity Assessment Lower Extremity Assessment: Overall WFL for tasks assessed    Cervical / Trunk Assessment Cervical / Trunk Assessment: Normal  Communication   Communication: No difficulties  Cognition Arousal/Alertness: Awake/alert Behavior During Therapy: WFL for tasks assessed/performed Overall Cognitive Status: Within Functional Limits for tasks assessed                                        General Comments General comments (skin integrity, edema, etc.): 02 sats 93 on RA with activity.   Pt feels he is close to baseline, he just "feels weak"     Exercises     Assessment/Plan    PT Assessment Patent does not need any further PT services  PT Problem List         PT Treatment Interventions  PT Goals (Current goals can be found in the Care Plan section)  Acute Rehab PT Goals Patient Stated Goal: to go back to sleep  PT Goal Formulation: All assessment and education complete, DC therapy    Frequency     Barriers to discharge        Co-evaluation PT/OT/SLP Co-Evaluation/Treatment: Yes Reason for Co-Treatment: For patient/therapist safety PT goals addressed during session: Mobility/safety with mobility OT goals addressed during session: ADL's and self-care       AM-PAC PT "6 Clicks" Mobility  Outcome Measure Help needed turning from your back to your side while in a flat bed without using bedrails?: None Help needed moving from lying on your back to sitting on the side of a flat bed without using bedrails?: None Help needed  moving to and from a bed to a chair (including a wheelchair)?: None Help needed standing up from a chair using your arms (e.g., wheelchair or bedside chair)?: None Help needed to walk in hospital room?: None Help needed climbing 3-5 steps with a railing? : None 6 Click Score: 24    End of Session   Activity Tolerance: Patient tolerated treatment well Patient left: in bed;with call bell/phone within reach;with bed alarm set   PT Visit Diagnosis: Other abnormalities of gait and mobility (R26.89)    Time: 1202-1217 PT Time Calculation (min) (ACUTE ONLY): 15 min   Charges:   PT Evaluation $PT Eval Low Complexity: 1 Low          05/13/2018  Donnella Sham, PT Acute Rehabilitation Services [phone]  (pager) [phone]  (office)  Tessie Fass Joselyn Edling 05/13/2018, 2:55 PM

## 2018-05-13 NOTE — Progress Notes (Signed)
[facility]  Subjective  - No concerns.   Objective (!) 153/95 89 (!) 97.3 F (36.3 C) (Oral) (!) 26 (!) 86%  Intake/Output Summary (Last 24 hours) at 05/13/2018 1026 Last data filed at 05/13/2018 0748 Gross per 24 hour  Intake 630 ml  Output 2760 ml  Net -2130 ml      Laboratory Lab Results: Recent Labs    05/12/18 1910 05/13/18 0754  WBC 6.8 6.5  HGB 8.8* 8.6*  HCT 27.5* 28.5*  PLT 161 157   BMET Recent Labs    05/12/18 1910 05/13/18 0754  NA 139 140  K 3.5 3.8  CL 101 103  CO2 27 24  GLUCOSE 148* 114*  BUN 56* 62*  CREATININE 10.58* 12.24*  CALCIUM 8.0* 8.2*    COAG Lab Results  Component Value Date   INR 1.21 05/11/2018   INR 1.16 05/10/2018   INR 1.1 02/09/2009   No results found for: PTT  Assessment/Planning: Plan for bilateral upper extremity venogram for AV access planning.  Marty Heck 05/13/2018 10:26 AM --

## 2018-05-13 NOTE — Progress Notes (Signed)
PROGRESS NOTE    [name] Mode  JQB:[id number] DOB: [birth year] DOA: 05/10/2018 PCP: Luetta Nutting, DO    Brief Narrative:   Patient is a 42 year old gentleman history of diabetes, hypertension, DD RT from [place] in 2014 after having been on HD short-term PD x7 years initially followed by [facility], history of obstructive sleep apnea, poorly controlled hypertension, morbid obesity who has had progressive worsening of renal function with last creatinine noted at 4.8 on 03/03/2018 at [facility].  Patient with history of transplant renal biopsy June 2019 specimen deemed inadequate.  Patient presented to the ED with facial swelling and increasing lower extremity swelling over the past week with a 20 pound weight gain.  Patient seen in the ED with hypertensive urgency/emergency, acute on chronic kidney disease stage IV with creatinine of 18.64.  Patient placed on high-dose IV Lasix, oral antihypertensive medications, nicardipine drip.  Nephrology consulted and patient for tunneled HD catheter in anticipation of hemodialysis.    Subjective: -Patient reports some headache, he had couple episodes of vomiting today as well  Assessment & Plan:   Principal Problem:   Acute kidney injury superimposed on CKD ([place]) Active Problems:   OSA (obstructive sleep apnea)   Essential hypertension   Immunosuppression ([place])   Morbid obesity ([place])   Kidney replaced by transplant   Type 2 diabetes mellitus without complication, without long-term current use of insulin ([facility])   Hypertensive urgency   Acidosis   Volume overload   Anemia   Hypertensive emergency  AKI on CKD stage IV  -Creatinine was 4.8 in September of this year,18 on admission, he already received 2 hemodialysis sessions, plan for another dialysis today . -Management per renal, and to dialyze today -Patient with renal biopsy June of this year, inadequate tissue, per renal there is a strong suspicion for secondary FSGS obesity with a  UPC of 6 -Patient had tunneled hemodialysis catheter in right groin per IR, vascular surgery following for permanent access, patient went for upper extremity venogram today to plan further access  Renal transplant patient -Continue with immunosuppressive therapy   Hypertensive emergency/urgency -Blood pressure extremely difficult to control on admission, but it is acceptable on current regimen, I would anticipate it will drop further upon further dialysis, for now continue with current regimen including Norvasc, Coreg, hydralazine, clonidine and PRN hydralazine  Anemia Patient with no overt bleeding.  Received 1 unit PRBC yesterday for hemoglobin of 7.7, with good response, hemoglobin is 8.6 today  Acidosis Secondary to failure.  Patient to have tunneled HD catheter placed today in anticipation of starting hemodialysis today.  Per nephrology.  Well controlled diabetes mellitus 2 Hemoglobin A1c at 5.8.  CBG of 86 this morning.  Continue to hold home oral hypoglycemic agents.  Continue sliding scale insulin.   Hyperlipidemia LDL elevated at 189   Continue home regimen statin.  Morbid obesity  Obstructive sleep apnea CPAP nightly.  DVT prophylaxis: Heparin Code Status: Full Family Communication: Updated patient.  No family at bedside. Disposition Plan: Likely home when medically stable clinically improved and per nephrology.   Consultants:   Nephrology   interventional radiology   Vascular surgery  Procedures:   Ultrasound of transplanted kidney 05/10/2018 Chest x-ray 05/10/2018    Antimicrobials:   None      Objective: Vitals:   05/13/18 1410 05/13/18 1419 05/13/18 1425 05/13/18 1430  BP: (!) 172/88 (!) 150/74 (!) 142/80 (!) 158/83  Pulse: 80 76 74 76  Resp: (!) 22 (!) 23 15  Temp: 97.7 F (36.5 C)     TempSrc: Oral     SpO2:      Weight: (!) 158 kg     Height:        Intake/Output Summary (Last 24 hours) at 05/13/2018 1457 Last data filed at  05/13/2018 0748 Gross per 24 hour  Intake 630 ml  Output 2760 ml  Net -2130 ml   Filed Weights   05/12/18 1316 05/12/18 1630 05/13/18 1410  Weight: (!) 161.2 kg (!) 159.1 kg (!) 158 kg    Examination:  Awake Alert, Oriented X 3, No new F.N deficits, Normal affect Symmetrical Chest wall movement, Good air movement bilaterally, CTAB RRR,No Gallops,Rubs or new Murmurs, No Parasternal Heave +ve B.Sounds, Abd Soft, No tenderness, No rebound - guarding or rigidity. No Cyanosis, Clubbing , lower extremity edema resolved, No new Rash or bruise   Right groin hemodialysis catheter, no bleed or discharge noted from site       Data Reviewed: I have personally reviewed following labs and imaging studies  CBC: Recent Labs  Lab 05/10/18 1521 05/10/18 2119 05/11/18 0344 05/12/18 0051 05/12/18 1910 05/13/18 0754  WBC 7.8 7.8 8.6 7.9 6.8 6.5  NEUTROABS 6.4  --   --   --   --   --   HGB 8.1* 8.7* 8.4* 7.7* 8.8* 8.6*  HCT 26.6* 28.2* 27.8* 25.9* 27.5* 28.5*  MCV 82.9 82.7 82.7 84.1 81.4 82.6  PLT 141* 168 167 164 161 841   Basic Metabolic Panel: Recent Labs  Lab 05/10/18 2119 05/11/18 0344 05/12/18 0051 05/12/18 1910 05/13/18 0754  NA 139 140 140 139 140  K 4.4 4.1 4.3 3.5 3.8  CL 105 106 107 101 103  CO2 15* 14* 15* 27 24  GLUCOSE 102* 133* 149* 148* 114*  BUN 126* 135* 139* 56* 62*  CREATININE 17.81* 18.58* 19.30* 10.58* 12.24*  CALCIUM 7.3* 7.5* 7.6* 8.0* 8.2*  PHOS >12.0* 11.2* 14.1* 7.4* 9.8*   GFR: Estimated Creatinine Clearance: 12.4 mL/min (A) (by C-G formula based on SCr of 12.24 mg/dL (H)). Liver Function Tests: Recent Labs  Lab 05/10/18 1521 05/10/18 2119 05/11/18 0050 05/11/18 0344 05/12/18 0051 05/12/18 1910 05/13/18 0754  AST 10*  --   --  8*  --   --   --   ALT 12  --  10 11  --   --   --   ALKPHOS 38  --   --  36*  --   --   --   BILITOT 0.6  --   --  0.6  --   --   --   PROT 5.8*  --   --  5.6*  --   --   --   ALBUMIN 2.5* 2.5*  --  2.7* 2.3*  2.4* 2.3*   No results for input(s): LIPASE, AMYLASE in the last 168 hours. No results for input(s): AMMONIA in the last 168 hours. Coagulation Profile: Recent Labs  Lab 05/10/18 2119 05/11/18 0344  INR 1.16 1.21   Cardiac Enzymes: No results for input(s): CKTOTAL, CKMB, CKMBINDEX, TROPONINI in the last 168 hours. BNP (last 3 results) No results for input(s): PROBNP in the last 8760 hours. HbA1C: Recent Labs    05/11/18 0235  HGBA1C 5.8*   CBG: Recent Labs  Lab 05/12/18 1233 05/12/18 1715 05/12/18 2007 05/13/18 0813 05/13/18 1155  GLUCAP 117* 107* 149* 105* 106*   Lipid Profile: Recent Labs    05/12/18 0536  CHOL 266*  HDL 45  LDLCALC 189*  TRIG 159*  CHOLHDL 5.9   Thyroid Function Tests: Recent Labs    05/11/18 1308  TSH 2.619   Anemia Panel: Recent Labs    05/10/18 2119 05/11/18 0235  VITAMINB12 198  --   FOLATE 9.0  --   FERRITIN 1,154* 1,172*  TIBC 168* 183*  IRON 103 108  RETICCTPCT 1.1  --    Sepsis Labs: No results for input(s): PROCALCITON, LATICACIDVEN in the last 168 hours.  Recent Results (from the past 240 hour(s))  Urine Culture     Status: None   Collection Time: 05/10/18  6:14 PM  Result Value Ref Range Status   Specimen Description URINE, CLEAN CATCH  Final   Special Requests NONE  Final   Culture   Final    NO GROWTH Performed at [facility], [address], [place], [place] [address]    Report Status 05/11/2018 FINAL  Final  Culture, blood (Routine X 2) w Reflex to ID Panel     Status: None (Preliminary result)   Collection Time: 05/11/18 11:32 AM  Result Value Ref Range Status   Specimen Description BLOOD BLOOD LEFT FOREARM  Final   Special Requests   Final    BOTTLES DRAWN AEROBIC ONLY Blood Culture adequate volume   Culture   Final    NO GROWTH 2 DAYS Performed at [facility], [place] [address] [place], [place] [address]    Report Status PENDING  Incomplete  Culture, blood (Routine X 2) w Reflex to ID  Panel     Status: None (Preliminary result)   Collection Time: 05/11/18  6:24 PM  Result Value Ref Range Status   Specimen Description BLOOD LEFT ANTECUBITAL  Final   Special Requests   Final    BOTTLES DRAWN AEROBIC ONLY Blood Culture results may not be optimal due to an inadequate volume of blood received in culture bottles   Culture   Final    NO GROWTH 2 DAYS Performed at [facility], [place] [address] [place], [place] [address]    Report Status PENDING  Incomplete  MRSA PCR Screening     Status: None   Collection Time: 05/11/18  6:30 PM  Result Value Ref Range Status   MRSA by PCR NEGATIVE NEGATIVE Final    Comment:        The GeneXpert MRSA Assay (FDA approved for NASAL specimens only), is one component of a comprehensive MRSA colonization surveillance program. It is not intended to diagnose MRSA infection nor to guide or monitor treatment for MRSA infections. Performed at [facility], [place] [address], [place], [place] [address]          Radiology Studies: Ir Cyndy Freeze Guide Cv Line Right  Result Date: 05/11/2018 INDICATION: History of end-stage renal disease now with failed renal transplant. Patient with multiple previous tunneled dialysis catheters and concern for upper extremity central venous occlusion demonstrated on remote declot performed 02/17/2018 and as patient previously dialyzed via a right femoral approach dialysis catheter prior to receiving his renal transplant. EXAM: TUNNELED CENTRAL VENOUS HEMODIALYSIS CATHETER PLACEMENT WITH ULTRASOUND AND FLUOROSCOPIC GUIDANCE MEDICATIONS: Ancef 1 gm IV . The antibiotic was given in an appropriate time interval prior to skin puncture. ANESTHESIA/SEDATION: Versed 1.5 mg IV; Fentanyl 100 mcg IV; Moderate Sedation Time:  30 minutes The patient was continuously monitored during the procedure by the interventional radiology nurse under my direct supervision. FLUOROSCOPY TIME:  2 minutes, 24  seconds (41 mGy)  COMPLICATIONS: None immediate. PROCEDURE: Informed written consent was obtained from the patient after a discussion of the risks, benefits, and alternatives to treatment. Questions regarding the procedure were encouraged and answered. Given above, decision was made to proceed with a tunneled right common femoral approach dialysis catheter. Sonographic evaluation demonstrated persistent patency of the right common femoral vein. As such, the right groin and superolateral aspect the right thigh was prepped with chlorhexidine in a sterile fashion, and a sterile drape was applied covering the operative field. Maximum barrier sterile technique with sterile gowns and gloves were used for the procedure. A timeout was performed prior to the initiation of the procedure. After creating a small venotomy incision, a micropuncture kit was utilized to access the right common femoral vein. Real-time ultrasound guidance was utilized for vascular access including the acquisition of a permanent ultrasound image documenting patency of the accessed vessel. A stiff glidewire was utilized for measurement purposes Next, the micropuncture sheath was exchanged for a peel-away sheath under fluoroscopic guidance. A asked split tunneled hemodialysis catheter measuring 55 cm from tip to cuff was tunneled in a retrograde fashion from the anterolateral aspect the right thigh to the venotomy incision. The catheter was then placed through the peel-away sheath with tips ultimately positioned within the perihepatic IVC. Given concern for slightly caudal positioning of the tips of the dialysis catheter, the decision was made to create a shorter length subcutaneous track. Note, despite measurement, patient has a large amount of redundant tissue about the thigh which resulted in suboptimal catheter tip positioning. As such, a more proximal location on the patient's anterolateral aspect right thigh was selected and utilized to tunnel a new 55 cm dialysis  catheter to the venotomy incision. Existing dialysis catheter was retracted from its access site at the venotomy site. The peripheral aspect of the dialysis catheter was cut, the peripheral component was removed. A stiff glidewire was utilized to cannulate the remainder of the remaining portion of the existing dialysis catheter and advanced to the level of the inferior cavoatrial junction. Next, the remaining portion of the initial dialysis catheter was exchanged for a new the low a sheath under fluoroscopic guidance. Finally, the new 55 cm tip to cuff dialysis catheter was inserted through the peel-away sheath with tip ultimately terminating within the inferior cavoatrial junction. Final catheter positioning was confirmed and documented with a spot fluoroscopic images. The catheter aspirates and flushes normally. The catheter was flushed with appropriate volume heparin dwells. The catheter exit site was secured with a 0-Prolene retention suture. The venotomy incision was closed with Dermabond and Steri-strips. Dressings were applied. The patient tolerated the procedure well without immediate post procedural complication. IMPRESSION: Successful placement of 55 cm tip to cuff tunneled hemodialysis catheter via the right common femoral vein vein with tips terminating within the inferior cavoatrial junction. The catheter is ready for immediate use. Electronically Signed   By: Sandi Mariscal M.D.   On: 05/11/2018 17:46   Ir US Guide Vasc Access Right  Result Date: 05/11/2018 INDICATION: History of end-stage renal disease now with failed renal transplant. Patient with multiple previous tunneled dialysis catheters and concern for upper extremity central venous occlusion demonstrated on remote declot performed 02/17/2018 and as patient previously dialyzed via a right femoral approach dialysis catheter prior to receiving his renal transplant. EXAM: TUNNELED CENTRAL VENOUS HEMODIALYSIS CATHETER PLACEMENT WITH ULTRASOUND AND  FLUOROSCOPIC GUIDANCE MEDICATIONS: Ancef 1 gm IV . The antibiotic was given in an appropriate time interval prior  to skin puncture. ANESTHESIA/SEDATION: Versed 1.5 mg IV; Fentanyl 100 mcg IV; Moderate Sedation Time:  30 minutes The patient was continuously monitored during the procedure by the interventional radiology nurse under my direct supervision. FLUOROSCOPY TIME:  2 minutes, 24 seconds (41 mGy) COMPLICATIONS: None immediate. PROCEDURE: Informed written consent was obtained from the patient after a discussion of the risks, benefits, and alternatives to treatment. Questions regarding the procedure were encouraged and answered. Given above, decision was made to proceed with a tunneled right common femoral approach dialysis catheter. Sonographic evaluation demonstrated persistent patency of the right common femoral vein. As such, the right groin and superolateral aspect the right thigh was prepped with chlorhexidine in a sterile fashion, and a sterile drape was applied covering the operative field. Maximum barrier sterile technique with sterile gowns and gloves were used for the procedure. A timeout was performed prior to the initiation of the procedure. After creating a small venotomy incision, a micropuncture kit was utilized to access the right common femoral vein. Real-time ultrasound guidance was utilized for vascular access including the acquisition of a permanent ultrasound image documenting patency of the accessed vessel. A stiff glidewire was utilized for measurement purposes Next, the micropuncture sheath was exchanged for a peel-away sheath under fluoroscopic guidance. A asked split tunneled hemodialysis catheter measuring 55 cm from tip to cuff was tunneled in a retrograde fashion from the anterolateral aspect the right thigh to the venotomy incision. The catheter was then placed through the peel-away sheath with tips ultimately positioned within the perihepatic IVC. Given concern for slightly caudal  positioning of the tips of the dialysis catheter, the decision was made to create a shorter length subcutaneous track. Note, despite measurement, patient has a large amount of redundant tissue about the thigh which resulted in suboptimal catheter tip positioning. As such, a more proximal location on the patient's anterolateral aspect right thigh was selected and utilized to tunnel a new 55 cm dialysis catheter to the venotomy incision. Existing dialysis catheter was retracted from its access site at the venotomy site. The peripheral aspect of the dialysis catheter was cut, the peripheral component was removed. A stiff glidewire was utilized to cannulate the remainder of the remaining portion of the existing dialysis catheter and advanced to the level of the inferior cavoatrial junction. Next, the remaining portion of the initial dialysis catheter was exchanged for a new the low a sheath under fluoroscopic guidance. Finally, the new 55 cm tip to cuff dialysis catheter was inserted through the peel-away sheath with tip ultimately terminating within the inferior cavoatrial junction. Final catheter positioning was confirmed and documented with a spot fluoroscopic images. The catheter aspirates and flushes normally. The catheter was flushed with appropriate volume heparin dwells. The catheter exit site was secured with a 0-Prolene retention suture. The venotomy incision was closed with Dermabond and Steri-strips. Dressings were applied. The patient tolerated the procedure well without immediate post procedural complication. IMPRESSION: Successful placement of 55 cm tip to cuff tunneled hemodialysis catheter via the right common femoral vein vein with tips terminating within the inferior cavoatrial junction. The catheter is ready for immediate use. Electronically Signed   By: Sandi Mariscal M.D.   On: 05/11/2018 17:46   Vas [place] Upper Ext Vein Mapping (pre-op Avf)  Result Date: 05/12/2018 UPPER EXTREMITY VEIN MAPPING   Indications: Pre-access. Limitations: patient body habitus, hypersomnolence Performing Technologist: Maudry Mayhew MHA, RDMS, RVT, RDCS  Examination Guidelines: A complete evaluation includes B-mode imaging, spectral Doppler, color Doppler, and power Doppler  as needed of all accessible portions of each vessel. Bilateral testing is considered an integral part of a complete examination. Limited examinations for reoccurring indications may be performed as noted. +-----------------+-------------+----------+--------------+ Right Cephalic   Diameter (cm)Depth (cm)   Findings    +-----------------+-------------+----------+--------------+ Shoulder             0.24        1.46      thrombus    +-----------------+-------------+----------+--------------+ Prox upper arm       0.25        1.50      thrombus    +-----------------+-------------+----------+--------------+ Mid upper arm        0.37        1.17      thrombus    +-----------------+-------------+----------+--------------+ Dist upper arm       0.27        0.65      thrombus    +-----------------+-------------+----------+--------------+ Antecubital fossa                       not visualized +-----------------+-------------+----------+--------------+ Prox forearm                            not visualized +-----------------+-------------+----------+--------------+ Mid forearm                             not visualized +-----------------+-------------+----------+--------------+ Dist forearm                            not visualized +-----------------+-------------+----------+--------------+ Wrist                                   not visualized +-----------------+-------------+----------+--------------+ +-----------------+-------------+----------+--------------+ Right Basilic    Diameter (cm)Depth (cm)   Findings    +-----------------+-------------+----------+--------------+ Shoulder                                 not visualized +-----------------+-------------+----------+--------------+ Prox upper arm                          not visualized +-----------------+-------------+----------+--------------+ Mid upper arm                           not visualized +-----------------+-------------+----------+--------------+ Dist upper arm                          not visualized +-----------------+-------------+----------+--------------+ Antecubital fossa                       not visualized +-----------------+-------------+----------+--------------+ Prox forearm                            not visualized +-----------------+-------------+----------+--------------+ Mid forearm                             not visualized +-----------------+-------------+----------+--------------+ Distal forearm                          not visualized +-----------------+-------------+----------+--------------+ Elbow  not visualized +-----------------+-------------+----------+--------------+ Wrist                                   not visualized +-----------------+-------------+----------+--------------+ Varicose veins seen in proximal upper arm +-----------------+-------------+----------+---------+ Left Cephalic    Diameter (cm)Depth (cm)Findings  +-----------------+-------------+----------+---------+ Shoulder             0.31        1.63             +-----------------+-------------+----------+---------+ Prox upper arm       0.36        1.07             +-----------------+-------------+----------+---------+ Mid upper arm        0.34        0.92             +-----------------+-------------+----------+---------+ Dist upper arm       0.21        0.64   branching +-----------------+-------------+----------+---------+ Antecubital fossa    0.55        0.66   thrombus  +-----------------+-------------+----------+---------+ Prox forearm         0.31         0.81             +-----------------+-------------+----------+---------+ Mid forearm          0.37        0.77             +-----------------+-------------+----------+---------+ Dist forearm         0.34        0.58   branching +-----------------+-------------+----------+---------+ Wrist                0.35        0.50             +-----------------+-------------+----------+---------+ +-----------------+-------------+----------+--------------+ Left Basilic     Diameter (cm)Depth (cm)   Findings    +-----------------+-------------+----------+--------------+ Shoulder                                not visualized +-----------------+-------------+----------+--------------+ Prox upper arm                          not visualized +-----------------+-------------+----------+--------------+ Mid upper arm                           not visualized +-----------------+-------------+----------+--------------+ Dist upper arm                          not visualized +-----------------+-------------+----------+--------------+ Antecubital fossa                       not visualized +-----------------+-------------+----------+--------------+ Prox forearm                            not visualized +-----------------+-------------+----------+--------------+ Mid forearm                             not visualized +-----------------+-------------+----------+--------------+ Distal forearm                          not visualized +-----------------+-------------+----------+--------------+ Elbow  not visualized +-----------------+-------------+----------+--------------+ Wrist                                   not visualized +-----------------+-------------+----------+--------------+ *See table(s) above for measurements and observations.  Diagnosing physician: Quay Burow MD Electronically signed by Quay Burow MD on 05/12/2018 at 4:49:44  PM.    Final         Scheduled Meds: . sodium chloride   Intravenous Once  . amLODipine  10 mg Oral Daily  . aspirin EC  81 mg Oral Daily  . atorvastatin  20 mg Oral Daily  . carvedilol  25 mg Oral BID  . chlorhexidine  15 mL Mouth Rinse BID  . Chlorhexidine Gluconate Cloth  6 each Topical Q0600  . cloNIDine  0.2 mg Oral TID  . heparin  5,000 Units Subcutaneous Q8H  . hydrALAZINE  100 mg Oral Q8H  . insulin aspart  0-9 Units Subcutaneous TID WC  . mouth rinse  15 mL Mouth Rinse q12n4p  . mycophenolate  1,000 mg Oral BID  . omega-3 acid ethyl esters  1 g Oral Daily  . predniSONE  5 mg Oral Q breakfast  . sodium chloride flush  3 mL Intravenous Q12H  . sodium chloride flush  3 mL Intravenous Q12H  . tacrolimus  1 mg Oral BID   Continuous Infusions: . sodium chloride       LOS: 3 days        Phillips Climes, MD [facility] Pager [phone] If 7PM-7AM, please contact night-coverage [email or web address] Password [email or web address] 05/13/2018, 2:57 PM

## 2018-05-14 ENCOUNTER — Encounter (HOSPITAL_COMMUNITY): Payer: 59

## 2018-05-14 LAB — GLUCOSE, CAPILLARY
GLUCOSE-CAPILLARY: 102 mg/dL — AB (ref 70–99)
Glucose-Capillary: 92 mg/dL (ref 70–99)
Glucose-Capillary: 106 mg/dL — ABNORMAL HIGH (ref 70–99)
Glucose-Capillary: 130 mg/dL — ABNORMAL HIGH (ref 70–99)

## 2018-05-14 LAB — BASIC METABOLIC PANEL
ANION GAP: 14 (ref 5–15)
BUN: 42 mg/dL — ABNORMAL HIGH (ref 6–20)
CO2: 23 mmol/L (ref 22–32)
Calcium: 8.1 mg/dL — ABNORMAL LOW (ref 8.9–10.3)
Chloride: 99 mmol/L (ref 98–111)
Creatinine, Ser: 10.08 mg/dL — ABNORMAL HIGH (ref 0.61–1.24)
GFR calc Af Amer: 7 mL/min — ABNORMAL LOW (ref >60)
GFR calc non Af Amer: 6 mL/min — ABNORMAL LOW (ref >60)
Glucose, Bld: 98 mg/dL (ref 70–99)
Potassium: 3.9 mmol/L (ref 3.5–5.1)
Sodium: 136 mmol/L (ref 135–145)

## 2018-05-14 LAB — CBC
HCT: 27.4 % — ABNORMAL LOW (ref 39.0–52.0)
Hemoglobin: 8.4 g/dL — ABNORMAL LOW (ref 13.0–17.0)
MCH: 25.7 pg — ABNORMAL LOW (ref 26.0–34.0)
MCHC: 30.7 g/dL (ref 30.0–36.0)
MCV: 83.8 fL (ref 80.0–100.0)
NRBC: 0 % (ref 0.0–0.2)
Platelets: 157 10*3/uL (ref 150–400)
RBC: 3.27 MIL/uL — ABNORMAL LOW (ref 4.22–5.81)
RDW: 14.8 % (ref 11.5–15.5)
WBC: 6.5 10*3/uL (ref 4.0–10.5)

## 2018-05-14 MED ORDER — TACROLIMUS 1 MG PO CAPS
1.5000 mg | ORAL_CAPSULE | Freq: Two times a day (BID) | ORAL | Status: DC
  Administered 2018-05-14 – 2018-05-21 (×14): 1.5 mg via ORAL
  Filled 2018-05-14 (×17): qty 1

## 2018-05-14 MED ORDER — HYDRALAZINE HCL 50 MG PO TABS
50.0000 mg | ORAL_TABLET | Freq: Three times a day (TID) | ORAL | Status: DC
  Administered 2018-05-14 – 2018-05-21 (×20): 50 mg via ORAL
  Filled 2018-05-14 (×20): qty 1

## 2018-05-14 NOTE — Progress Notes (Signed)
[facility] Progress Note   Assessment/ Plan:   1. Progressive CKD --> ESRD: failed transplant.  Transplant in 2014 with a creatinine that was already 4.8 in 03/03/2018 with inadequate tissue from a tx renal bx 11/2017. 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 HD cath placed with IR 12/3, VVS to place access, appreciate assistance.  Complicated access history- venogram showing bilateral axillary vein occlusions; our options are in the thighs.  Tac trough 5.2 Obstruction not likely, PVR 23.   2. DDRT with h/o DGF @ [facility]- prednisone 5 mg daily, MMF 1000 BID, tac 3/2 BID--> 5.2, reduced tac in setting of progressive CKD--> ESRD.  D/w Dr. Lissa Merlin, no need for repeat bx.  4. Hypertensive urgency/emergency- pressures initially better on home meds but had recurrence of hypertensive urgency/ emergency.  HD will help this and suspect a large part d/t progressive vol OL; coreg increased.  Hydralazine increased as well, also on clonidine and amlodipine.  Of note, he was on losartan as OP- hold for now, may need to add back (would do this once we are sure there is no chance for renal recovery)  5. DM  6. Secondary HPT- check PTH  6. Chronic Anemia- s/p1 u pRBCs, starting ESA with HD  7. OSA- needs to be adherent to CPAP  Subjective:    S/p HD #3 yesterday with aggressive volume removal.  BP better.  Discussed with Dr. Lissa Merlin of [facility].  No need for another biopsy.     Objective:   BP 103/74 (BP Location: Left Arm)   Pulse 81   Temp 97.7 F (36.5 C) (Oral)   Resp 16   Ht 6\' 2"  (1.88 m)   Wt (!) 154.8 kg   SpO2 97%   BMI 43.82 kg/m   Physical Exam: Gen: obese, sitting up, eating CVS: RRR Resp: clear bilaterally Abd: obese Ext: trace LE edema  Labs: BMET Recent Labs  Lab 05/10/18 1521 05/10/18 2119 05/11/18 0344 05/12/18 0051 05/12/18 1910 05/13/18 0754 05/14/18 0335  NA 138 139 140 140 139 140 136  K 4.0 4.4 4.1 4.3 3.5 3.8 3.9  CL  101 105 106 107 101 103 99  CO2 16* 15* 14* 15* 27 24 23   GLUCOSE 126* 102* 133* 149* 148* 114* 98  BUN 128* 126* 135* 139* 56* 62* 42*  CREATININE 18.64* 17.81* 18.58* 19.30* 10.58* 12.24* 10.08*  CALCIUM 7.5* 7.3* 7.5* 7.6* 8.0* 8.2* 8.1*  PHOS  --  >12.0* 11.2* 14.1* 7.4* 9.8*  --    CBC Recent Labs  Lab 05/10/18 1521  05/12/18 0051 05/12/18 1910 05/13/18 0754 05/14/18 0335  WBC 7.8   < > 7.9 6.8 6.5 6.5  NEUTROABS 6.4  --   --   --   --   --   HGB 8.1*   < > 7.7* 8.8* 8.6* 8.4*  HCT 26.6*   < > 25.9* 27.5* 28.5* 27.4*  MCV 82.9   < > 84.1 81.4 82.6 83.8  PLT 141*   < > 164 161 157 157   < > = values in this interval not displayed.    @IMGRELPRIORS @ Medications:    . sodium chloride   Intravenous Once  . amLODipine  10 mg Oral Daily  . aspirin EC  81 mg Oral Daily  . atorvastatin  20 mg Oral Daily  . carvedilol  25 mg Oral BID  . chlorhexidine  15 mL Mouth Rinse BID  . Chlorhexidine Gluconate  Cloth  6 each Topical [id number]  . cloNIDine  0.2 mg Oral TID  . heparin  5,000 Units Subcutaneous Q8H  . hydrALAZINE  100 mg Oral Q8H  . insulin aspart  0-9 Units Subcutaneous TID WC  . mouth rinse  15 mL Mouth Rinse q12n4p  . mycophenolate  1,000 mg Oral BID  . omega-3 acid ethyl esters  1 g Oral Daily  . predniSONE  5 mg Oral Q breakfast  . sodium chloride flush  3 mL Intravenous Q12H  . sodium chloride flush  3 mL Intravenous Q12H  . tacrolimus  1 mg Oral BID     Madelon Lips, MD 05/14/2018, 2:32 PM

## 2018-05-14 NOTE — Progress Notes (Signed)
   Patient has bilateral axillary vein occlusions with multiple collaterals.  This will leave him with thigh graft he does have a palpable dorsalis pedis pulse on the right.  Unfortunately is a failed thigh graft on the left.  We will plan for Tuesday to place either right femoral graft with left femoral vein tunneled catheter if that is possible or he will need a quick stick right femoral graft that can be cannulated for dialysis the following day.  I will see him again on Monday.  There are issues over the weekend please call vascular surgery on call.  Ajai Harville C. Donzetta Matters, MD [facility] Office: [phone] Pager: [phone]

## 2018-05-14 NOTE — Progress Notes (Signed)
PROGRESS NOTE    [name]  PPI:[id number] DOB: 1975-12-07 DOA: 05/10/2018 PCP: Luetta Nutting, DO    Brief Narrative:   Patient is a 42 year old gentleman history of diabetes, hypertension, DD RT from [facility] in 2014 after having been on HD short-term PD x7 years initially followed by [facility], history of obstructive sleep apnea, poorly controlled hypertension, morbid obesity who has had progressive worsening of renal function with last creatinine noted at 4.8 on 03/03/2018 at [facility].  Patient with history of transplant renal biopsy June 2019 specimen deemed inadequate.  Patient presented to the ED with facial swelling and increasing lower extremity swelling over the past week with a 20 pound weight gain.  Patient seen in the ED with hypertensive urgency/emergency, acute on chronic kidney disease stage IV with creatinine of 18.64.  Patient placed on high-dose IV Lasix, oral antihypertensive medications, nicardipine drip.  Nephrology consulted and patient for tunneled HD catheter in anticipation of hemodialysis.    Subjective: -Patient reports he is feeling better today, denies any complaints  Assessment & Plan:   Principal Problem:   Acute kidney injury superimposed on CKD ([place]) Active Problems:   OSA (obstructive sleep apnea)   Essential hypertension   Immunosuppression ([place])   Morbid obesity ([place])   Kidney replaced by transplant   Type 2 diabetes mellitus without complication, without long-term current use of insulin ([facility])   Hypertensive urgency   Acidosis   Volume overload   Anemia   Hypertensive emergency  Aggressive CKD, transitioning to end-stage renal disease -Creatinine was 4.8 in September of this year,18 on admission, he has a transplant, appears to be failing, only on dialysis, management per renal -Patient with renal biopsy June of this year, inadequate tissue, per renal there is a strong suspicion for secondary FSGS obesity with a UPC of 6 -Patient  had tunneled hemodialysis catheter in right groin per IR, vascular surgery following for permanent access, this post venogram 05/13/2018, showing bilateral axillary vein occlusions, because of vascular surgery, plan for right groin access next Tuesday . -Dialysis per renal   Renal transplant patient -Continue with immunosuppressive therapy  -Discussed with nephrology and do, there is no need to repeat biopsy  Hypertensive emergency/urgency -Blood pressure extremely difficult to control on admission, he is on multiple medication to achieve acceptable control, as well it continues to improve as he receives dialysis and volume status is improving . -Blood pressure is low this afternoon, I will discontinue Norvasc, decrease hydralazine to 50 mg oral 3 times a day, continue with clonidine and Coreg, continue with PRN hydralazine  Anemia of chronic kidney disease -He was transfused 1 unit PRBC -Epogen per renal  Acidosis Secondary to failure.  Patient to have tunneled HD catheter placed today in anticipation of starting hemodialysis today.  Per nephrology.  Well controlled diabetes mellitus 2 Hemoglobin A1c at 5.8.  CBG of 86 this morning.  Continue to hold home oral hypoglycemic agents.  Continue sliding scale insulin.   Hyperlipidemia LDL elevated at 189   Continue home regimen statin.  Morbid obesity  Obstructive sleep apnea CPAP nightly.  DVT prophylaxis: Heparin Code Status: Full Family Communication: Updated patient.  No family at bedside. Disposition Plan: Likely home when medically stable clinically improved and per nephrology.   Consultants:   Nephrology   interventional radiology   Vascular surgery  Procedures:   Ultrasound of transplanted kidney 05/10/2018 Chest x-ray 05/10/2018  Right groin temporary dialysis catheter  Antimicrobials:   None  Objective: Vitals:   05/14/18 0820 05/14/18 1014 05/14/18 1313 05/14/18 1421  BP: (!) 142/85 (!) 152/78  (!) 152/83 103/74  Pulse: 82 100  81  Resp: 20   16  Temp: 97.7 F (36.5 C)     TempSrc: Oral     SpO2: 99% (!) 77%  97%  Weight:      Height:        Intake/Output Summary (Last 24 hours) at 05/14/2018 1505 Last data filed at 05/13/2018 1750 Gross per 24 hour  Intake -  Output 2500 ml  Net -2500 ml   Filed Weights   05/12/18 1630 05/13/18 1410 05/13/18 1750  Weight: (!) 159.1 kg (!) 158 kg (!) 154.8 kg    Examination:  Awake Alert, Oriented X 3, No new F.N deficits, Normal affect Symmetrical Chest wall movement, Good air movement bilaterally, CTAB RRR,No Gallops,Rubs or new Murmurs, No Parasternal Heave +ve B.Sounds, Abd Soft, No tenderness, No rebound - guarding or rigidity. No Cyanosis, Clubbing or edema, No new Rash or bruise          Data Reviewed: I have personally reviewed following labs and imaging studies  CBC: Recent Labs  Lab 05/10/18 1521  05/11/18 0344 05/12/18 0051 05/12/18 1910 05/13/18 0754 05/14/18 0335  WBC 7.8   < > 8.6 7.9 6.8 6.5 6.5  NEUTROABS 6.4  --   --   --   --   --   --   HGB 8.1*   < > 8.4* 7.7* 8.8* 8.6* 8.4*  HCT 26.6*   < > 27.8* 25.9* 27.5* 28.5* 27.4*  MCV 82.9   < > 82.7 84.1 81.4 82.6 83.8  PLT 141*   < > 167 164 161 157 157   < > = values in this interval not displayed.   Basic Metabolic Panel: Recent Labs  Lab 05/10/18 2119 05/11/18 0344 05/12/18 0051 05/12/18 1910 05/13/18 0754 05/14/18 0335  NA 139 140 140 139 140 136  K 4.4 4.1 4.3 3.5 3.8 3.9  CL 105 106 107 101 103 99  CO2 15* 14* 15* 27 24 23   GLUCOSE 102* 133* 149* 148* 114* 98  BUN 126* 135* 139* 56* 62* 42*  CREATININE 17.81* 18.58* 19.30* 10.58* 12.24* 10.08*  CALCIUM 7.3* 7.5* 7.6* 8.0* 8.2* 8.1*  PHOS >12.0* 11.2* 14.1* 7.4* 9.8*  --    GFR: Estimated Creatinine Clearance: 14.9 mL/min (A) (by C-G formula based on SCr of 10.08 mg/dL (H)). Liver Function Tests: Recent Labs  Lab 05/10/18 1521 05/10/18 2119 05/11/18 0050 05/11/18 0344  05/12/18 0051 05/12/18 1910 05/13/18 0754  AST 10*  --   --  8*  --   --   --   ALT 12  --  10 11  --   --   --   ALKPHOS 38  --   --  36*  --   --   --   BILITOT 0.6  --   --  0.6  --   --   --   PROT 5.8*  --   --  5.6*  --   --   --   ALBUMIN 2.5* 2.5*  --  2.7* 2.3* 2.4* 2.3*   No results for input(s): LIPASE, AMYLASE in the last 168 hours. No results for input(s): AMMONIA in the last 168 hours. Coagulation Profile: Recent Labs  Lab 05/10/18 2119 05/11/18 0344  INR 1.16 1.21   Cardiac Enzymes: No results for input(s): CKTOTAL, CKMB, CKMBINDEX, TROPONINI in the  last 168 hours. BNP (last 3 results) No results for input(s): PROBNP in the last 8760 hours. HbA1C: No results for input(s): HGBA1C in the last 72 hours. CBG: Recent Labs  Lab 05/13/18 1155 05/13/18 1825 05/13/18 2201 05/14/18 0812 05/14/18 1148  GLUCAP 106* 87 129* 92 106*   Lipid Profile: Recent Labs    05/12/18 0536  CHOL 266*  HDL 45  LDLCALC 189*  TRIG 159*  CHOLHDL 5.9   Thyroid Function Tests: No results for input(s): TSH, T4TOTAL, FREET4, T3FREE, THYROIDAB in the last 72 hours. Anemia Panel: No results for input(s): VITAMINB12, FOLATE, FERRITIN, TIBC, IRON, RETICCTPCT in the last 72 hours. Sepsis Labs: No results for input(s): PROCALCITON, LATICACIDVEN in the last 168 hours.  Recent Results (from the past 240 hour(s))  Urine Culture     Status: None   Collection Time: 05/10/18  6:14 PM  Result Value Ref Range Status   Specimen Description URINE, CLEAN CATCH  Final   Special Requests NONE  Final   Culture   Final    NO GROWTH Performed at [facility], [address], [place], [place] [address]    Report Status 05/11/2018 FINAL  Final  Culture, blood (Routine X 2) w Reflex to ID Panel     Status: None (Preliminary result)   Collection Time: 05/11/18 11:32 AM  Result Value Ref Range Status   Specimen Description BLOOD BLOOD LEFT FOREARM  Final   Special Requests   Final     BOTTLES DRAWN AEROBIC ONLY Blood Culture adequate volume   Culture   Final    NO GROWTH 3 DAYS Performed at [facility], [place] [address] [place], [place] [address]    Report Status PENDING  Incomplete  Culture, blood (Routine X 2) w Reflex to ID Panel     Status: None (Preliminary result)   Collection Time: 05/11/18  6:24 PM  Result Value Ref Range Status   Specimen Description BLOOD LEFT ANTECUBITAL  Final   Special Requests   Final    BOTTLES DRAWN AEROBIC ONLY Blood Culture results may not be optimal due to an inadequate volume of blood received in culture bottles   Culture   Final    NO GROWTH 3 DAYS Performed at [facility], [place] [address] [place], [place] [address]    Report Status PENDING  Incomplete  MRSA PCR Screening     Status: None   Collection Time: 05/11/18  6:30 PM  Result Value Ref Range Status   MRSA by PCR NEGATIVE NEGATIVE Final    Comment:        The GeneXpert MRSA Assay (FDA approved for NASAL specimens only), is one component of a comprehensive MRSA colonization surveillance program. It is not intended to diagnose MRSA infection nor to guide or monitor treatment for MRSA infections. Performed at [facility], [place] [address], [place], [place] [address]          Radiology Studies: No results found.      Scheduled Meds: . sodium chloride   Intravenous Once  . amLODipine  10 mg Oral Daily  . aspirin EC  81 mg Oral Daily  . atorvastatin  20 mg Oral Daily  . carvedilol  25 mg Oral BID  . chlorhexidine  15 mL Mouth Rinse BID  . Chlorhexidine Gluconate Cloth  6 each Topical Q0600  . cloNIDine  0.2 mg Oral TID  . heparin  5,000 Units Subcutaneous Q8H  . hydrALAZINE  100 mg Oral Q8H  . insulin aspart  0-9 Units Subcutaneous TID WC  . mouth rinse  15 mL Mouth Rinse q12n4p  . mycophenolate  1,000 mg Oral BID  . omega-3 acid ethyl esters  1 g Oral Daily  . predniSONE  5 mg Oral Q breakfast  . sodium chloride flush  3 mL  Intravenous Q12H  . sodium chloride flush  3 mL Intravenous Q12H  . tacrolimus  1.5 mg Oral BID   Continuous Infusions: . sodium chloride       LOS: 4 days        Phillips Climes, MD [facility] Pager [phone] If 7PM-7AM, please contact night-coverage [email or web address] Password [email or web address] 05/14/2018, 3:05 PM

## 2018-05-14 NOTE — Progress Notes (Signed)
ABIs will be completed 05/15/2018.

## 2018-05-15 ENCOUNTER — Encounter (HOSPITAL_COMMUNITY): Payer: 59

## 2018-05-15 LAB — GLUCOSE, CAPILLARY
Glucose-Capillary: 91 mg/dL (ref 70–99)
Glucose-Capillary: 104 mg/dL — ABNORMAL HIGH (ref 70–99)
Glucose-Capillary: 167 mg/dL — ABNORMAL HIGH (ref 70–99)

## 2018-05-15 MED ORDER — HEPARIN SODIUM (PORCINE) 1000 UNIT/ML IJ SOLN
INTRAMUSCULAR | Status: AC
  Filled 2018-05-15: qty 4

## 2018-05-15 NOTE — Progress Notes (Signed)
[facility] Progress Note   Assessment/ Plan:   1. Progressive CKD --> ESRD: failed transplant.  Transplant in 2014 with a creatinine that was already 4.8 in 03/03/2018 with inadequate tissue from a tx renal bx 11/2017. 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 HD cath placed with IR 12/3, VVS to place access, appreciate assistance.  Complicated access history- venogram showing bilateral axillary vein occlusions; our options are in the thighs.  Tac trough 5.2 Obstruction not likely, PVR 23.   2. DDRT with h/o DGF @ [facility]- prednisone 5 mg daily, MMF 1000 BID, tac 3/2 BID--> 5.2, reduced tac in setting of progressive CKD--> ESRD.  D/w Dr. Lissa Merlin, no need for repeat bx.  4. Hypertensive urgency/emergency- pressures initially better on home meds but had recurrence of hypertensive urgency/ emergency.  HD will help this and suspect a large part d/t progressive vol OL; coreg increased.  Hydralazine increased as well, also on clonidine and amlodipine.  Of note, he was on losartan as OP- hold for now, may need to add back (would do this once we are sure there is no chance for renal recovery)  5. DM  6. Secondary HPT- PTH 324, start calcitriol with HD  6. Chronic Anemia- s/p1 u pRBCs, starting ESA with HD  7. OSA- needs to be adherent to CPAP- wearing it  Subjective:    S/p HD today.  Doing well, sleeping on CPAP   Objective:   BP (!) 139/95 (BP Location: Left Arm)   Pulse 81   Temp 98.1 F (36.7 C) (Oral)   Resp 20   Ht 6\' 2"  (1.88 m)   Wt (!) 151.8 kg   SpO2 99%   BMI 42.97 kg/m   Physical Exam: Gen: obese, sitting up, eating CVS: RRR Resp: clear bilaterally Abd: obese Ext: trace LE edema  Labs: BMET Recent Labs  Lab 05/10/18 1521 05/10/18 2119 05/11/18 0344 05/12/18 0051 05/12/18 1910 05/13/18 0754 05/14/18 0335  NA 138 139 140 140 139 140 136  K 4.0 4.4 4.1 4.3 3.5 3.8 3.9  CL 101 105 106 107 101 103 99  CO2 16* 15* 14* 15* 27 24  23   GLUCOSE 126* 102* 133* 149* 148* 114* 98  BUN 128* 126* 135* 139* 56* 62* 42*  CREATININE 18.64* 17.81* 18.58* 19.30* 10.58* 12.24* 10.08*  CALCIUM 7.5* 7.3* 7.5* 7.6* 8.0* 8.2* 8.1*  PHOS  --  >12.0* 11.2* 14.1* 7.4* 9.8*  --    CBC Recent Labs  Lab 05/10/18 1521  05/12/18 0051 05/12/18 1910 05/13/18 0754 05/14/18 0335  WBC 7.8   < > 7.9 6.8 6.5 6.5  NEUTROABS 6.4  --   --   --   --   --   HGB 8.1*   < > 7.7* 8.8* 8.6* 8.4*  HCT 26.6*   < > 25.9* 27.5* 28.5* 27.4*  MCV 82.9   < > 84.1 81.4 82.6 83.8  PLT 141*   < > 164 161 157 157   < > = values in this interval not displayed.    @IMGRELPRIORS @ Medications:    . sodium chloride   Intravenous Once  . aspirin EC  81 mg Oral Daily  . atorvastatin  20 mg Oral Daily  . carvedilol  25 mg Oral BID  . chlorhexidine  15 mL Mouth Rinse BID  . Chlorhexidine Gluconate Cloth  6 each Topical Q0600  . cloNIDine  0.2 mg Oral TID  . heparin      .  heparin  5,000 Units Subcutaneous Q8H  . hydrALAZINE  50 mg Oral Q8H  . insulin aspart  0-9 Units Subcutaneous TID WC  . mouth rinse  15 mL Mouth Rinse q12n4p  . mycophenolate  1,000 mg Oral BID  . omega-3 acid ethyl esters  1 g Oral Daily  . predniSONE  5 mg Oral Q breakfast  . sodium chloride flush  3 mL Intravenous Q12H  . sodium chloride flush  3 mL Intravenous Q12H  . tacrolimus  1.5 mg Oral BID     Madelon Lips, MD 05/15/2018, 5:35 PM

## 2018-05-15 NOTE — Progress Notes (Signed)
Patient places herself on CPAP and will call if any assistance needed

## 2018-05-15 NOTE — Progress Notes (Signed)
PROGRESS NOTE    [name]  DDU:[id number] DOB: 03-31-1976 DOA: 05/10/2018 PCP: Luetta Nutting, DO    Brief Narrative:   Patient is a 42 year old gentleman history of diabetes, hypertension, DD RT from [place] in 2014 after having been on HD short-term PD x7 years initially followed by [facility], history of obstructive sleep apnea, poorly controlled hypertension, morbid obesity who has had progressive worsening of renal function with last creatinine noted at 4.8 on 03/03/2018 at [facility].  Patient with history of transplant renal biopsy June 2019 specimen deemed inadequate.  Patient presented to the ED with facial swelling and increasing lower extremity swelling over the past week with a 20 pound weight gain.  Patient seen in the ED with hypertensive urgency/emergency, acute on chronic kidney disease stage IV with creatinine of 18.64.  Patient placed on high-dose IV Lasix, oral antihypertensive medications, nicardipine drip.  Nephrology consulted and patient for tunneled HD catheter in anticipation of hemodialysis.    Subjective: -Patient reports he is feeling better today, denies any complaints  Assessment & Plan:   Principal Problem:   Acute kidney injury superimposed on CKD ([place]) Active Problems:   OSA (obstructive sleep apnea)   Essential hypertension   Immunosuppression ([place])   Morbid obesity ([place])   Kidney replaced by transplant   Type 2 diabetes mellitus without complication, without long-term current use of insulin ([facility])   Hypertensive urgency   Acidosis   Volume overload   Anemia   Hypertensive emergency  Aggressive CKD, transitioning to end-stage renal disease -Creatinine was 4.8 in September of this year,18 on admission, he has a transplant, appears to be failing, only on dialysis, management per renal -Patient with renal biopsy June of this year, inadequate tissue, per renal there is a strong suspicion for secondary FSGS obesity with a UPC of 6 -Patient  had tunneled hemodialysis catheter in right groin per IR, vascular surgery following for permanent access, this post venogram 05/13/2018, showing bilateral axillary vein occlusions, because of vascular surgery, plan for right groin access next Tuesday . -Dialysis per renal   Renal transplant patient -Continue with immunosuppressive therapy  -Discussed with nephrology and do, there is no need to repeat biopsy  Hypertensive emergency/urgency -Blood pressure extremely difficult to control on admission, he is on multiple medication to achieve acceptable control, as well it continues to improve as he receives dialysis and volume status is improving . -Blood pressure is much better controlled after being started on hemodialysis, had to decrease some of his medications, so blood pressure is currently acceptable on current regimen of hydralazine 50 mg 3 times a day, clonidine, Coreg, on PRN hydralazine.  Anemia of chronic kidney disease -He was transfused 1 unit PRBC -Epogen per renal  Acidosis Secondary to failure.  Patient to have tunneled HD catheter placed today in anticipation of starting hemodialysis today.  Per nephrology.  Well controlled diabetes mellitus 2 Hemoglobin A1c at 5.8.  CBG of 86 this morning.  Continue to hold home oral hypoglycemic agents.  Continue sliding scale insulin.   Hyperlipidemia LDL elevated at 189   Continue home regimen statin.  Morbid obesity  Obstructive sleep apnea CPAP nightly.  DVT prophylaxis: Heparin Code Status: Full Family Communication: Updated patient.  No family at bedside. Disposition Plan: Likely home when medically stable clinically improved and per nephrology.   Consultants:   Nephrology   interventional radiology   Vascular surgery  Procedures:   Ultrasound of transplanted kidney 05/10/2018 Chest x-ray 05/10/2018  Right groin temporary  dialysis catheter  Antimicrobials:   None      Objective: Vitals:   05/15/18  1100 05/15/18 1130 05/15/18 1138 05/15/18 1252  BP: (!) 158/91 (!) 151/92 (!) 149/89 (!) 139/95  Pulse: 81 70 72 81  Resp:   20   Temp:   98.1 F (36.7 C)   TempSrc:   Oral   SpO2:   99%   Weight:   (!) 151.8 kg   Height:        Intake/Output Summary (Last 24 hours) at 05/15/2018 1624 Last data filed at 05/15/2018 1138 Gross per 24 hour  Intake -  Output 3200 ml  Net -3200 ml   Filed Weights   05/15/18 0607 05/15/18 0732 05/15/18 1138  Weight: (!) 155.5 kg (!) 155 kg (!) 151.8 kg    Examination:  Awake Alert, Oriented X 3, No new F.N deficits, Normal affect Symmetrical Chest wall movement, Good air movement bilaterally, CTAB RRR,No Gallops,Rubs or new Murmurs, No Parasternal Heave +ve B.Sounds, Abd Soft, No tenderness, No rebound - guarding or rigidity. No Cyanosis, Clubbing or edema, No new Rash or bruise      Data Reviewed: I have personally reviewed following labs and imaging studies  CBC: Recent Labs  Lab 05/10/18 1521  05/11/18 0344 05/12/18 0051 05/12/18 1910 05/13/18 0754 05/14/18 0335  WBC 7.8   < > 8.6 7.9 6.8 6.5 6.5  NEUTROABS 6.4  --   --   --   --   --   --   HGB 8.1*   < > 8.4* 7.7* 8.8* 8.6* 8.4*  HCT 26.6*   < > 27.8* 25.9* 27.5* 28.5* 27.4*  MCV 82.9   < > 82.7 84.1 81.4 82.6 83.8  PLT 141*   < > 167 164 161 157 157   < > = values in this interval not displayed.   Basic Metabolic Panel: Recent Labs  Lab 05/10/18 2119 05/11/18 0344 05/12/18 0051 05/12/18 1910 05/13/18 0754 05/14/18 0335  NA 139 140 140 139 140 136  K 4.4 4.1 4.3 3.5 3.8 3.9  CL 105 106 107 101 103 99  CO2 15* 14* 15* 27 24 23   GLUCOSE 102* 133* 149* 148* 114* 98  BUN 126* 135* 139* 56* 62* 42*  CREATININE 17.81* 18.58* 19.30* 10.58* 12.24* 10.08*  CALCIUM 7.3* 7.5* 7.6* 8.0* 8.2* 8.1*  PHOS >12.0* 11.2* 14.1* 7.4* 9.8*  --    GFR: Estimated Creatinine Clearance: 14.7 mL/min (A) (by C-G formula based on SCr of 10.08 mg/dL (H)). Liver Function Tests: Recent Labs    Lab 05/10/18 1521 05/10/18 2119 05/11/18 0050 05/11/18 0344 05/12/18 0051 05/12/18 1910 05/13/18 0754  AST 10*  --   --  8*  --   --   --   ALT 12  --  10 11  --   --   --   ALKPHOS 38  --   --  36*  --   --   --   BILITOT 0.6  --   --  0.6  --   --   --   PROT 5.8*  --   --  5.6*  --   --   --   ALBUMIN 2.5* 2.5*  --  2.7* 2.3* 2.4* 2.3*   No results for input(s): LIPASE, AMYLASE in the last 168 hours. No results for input(s): AMMONIA in the last 168 hours. Coagulation Profile: Recent Labs  Lab 05/10/18 2119 05/11/18 0344  INR 1.16 1.21   Cardiac  Enzymes: No results for input(s): CKTOTAL, CKMB, CKMBINDEX, TROPONINI in the last 168 hours. BNP (last 3 results) No results for input(s): PROBNP in the last 8760 hours. HbA1C: No results for input(s): HGBA1C in the last 72 hours. CBG: Recent Labs  Lab 05/14/18 0812 05/14/18 1148 05/14/18 1702 05/14/18 2127 05/15/18 1250  GLUCAP 92 106* 102* 130* 91   Lipid Profile: No results for input(s): CHOL, HDL, LDLCALC, TRIG, CHOLHDL, LDLDIRECT in the last 72 hours. Thyroid Function Tests: No results for input(s): TSH, T4TOTAL, FREET4, T3FREE, THYROIDAB in the last 72 hours. Anemia Panel: No results for input(s): VITAMINB12, FOLATE, FERRITIN, TIBC, IRON, RETICCTPCT in the last 72 hours. Sepsis Labs: No results for input(s): PROCALCITON, LATICACIDVEN in the last 168 hours.  Recent Results (from the past 240 hour(s))  Urine Culture     Status: None   Collection Time: 05/10/18  6:14 PM  Result Value Ref Range Status   Specimen Description URINE, CLEAN CATCH  Final   Special Requests NONE  Final   Culture   Final    NO GROWTH Performed at [facility], [address], [place], [place] [address]    Report Status 05/11/2018 FINAL  Final  Culture, blood (Routine X 2) w Reflex to ID Panel     Status: None (Preliminary result)   Collection Time: 05/11/18 11:32 AM  Result Value Ref Range Status   Specimen Description BLOOD  BLOOD LEFT FOREARM  Final   Special Requests   Final    BOTTLES DRAWN AEROBIC ONLY Blood Culture adequate volume   Culture   Final    NO GROWTH 4 DAYS Performed at [facility], [place] [address] [place], [place] [address]    Report Status PENDING  Incomplete  Culture, blood (Routine X 2) w Reflex to ID Panel     Status: None (Preliminary result)   Collection Time: 05/11/18  6:24 PM  Result Value Ref Range Status   Specimen Description BLOOD LEFT ANTECUBITAL  Final   Special Requests   Final    BOTTLES DRAWN AEROBIC ONLY Blood Culture results may not be optimal due to an inadequate volume of blood received in culture bottles   Culture   Final    NO GROWTH 4 DAYS Performed at [facility], [place] [address] [place], [place] [address]    Report Status PENDING  Incomplete  MRSA PCR Screening     Status: None   Collection Time: 05/11/18  6:30 PM  Result Value Ref Range Status   MRSA by PCR NEGATIVE NEGATIVE Final    Comment:        The GeneXpert MRSA Assay (FDA approved for NASAL specimens only), is one component of a comprehensive MRSA colonization surveillance program. It is not intended to diagnose MRSA infection nor to guide or monitor treatment for MRSA infections. Performed at [facility], [place] [address], [place], [place] [address]          Radiology Studies: No results found.      Scheduled Meds: . sodium chloride   Intravenous Once  . aspirin EC  81 mg Oral Daily  . atorvastatin  20 mg Oral Daily  . carvedilol  25 mg Oral BID  . chlorhexidine  15 mL Mouth Rinse BID  . Chlorhexidine Gluconate Cloth  6 each Topical Q0600  . cloNIDine  0.2 mg Oral TID  . heparin      . heparin  5,000 Units Subcutaneous Q8H  .  hydrALAZINE  50 mg Oral Q8H  . insulin aspart  0-9 Units Subcutaneous TID WC  . mouth rinse  15 mL Mouth Rinse q12n4p  . mycophenolate  1,000 mg Oral BID  . omega-3 acid ethyl esters  1 g Oral Daily  . predniSONE  5 mg Oral Q  breakfast  . sodium chloride flush  3 mL Intravenous Q12H  . sodium chloride flush  3 mL Intravenous Q12H  . tacrolimus  1.5 mg Oral BID   Continuous Infusions: . sodium chloride       LOS: 5 days    Phillips Climes, MD [facility] Pager [phone] If 7PM-7AM, please contact night-coverage [email or web address] Password [facility] 05/15/2018, 4:24 PM

## 2018-05-16 ENCOUNTER — Inpatient Hospital Stay (HOSPITAL_COMMUNITY): Payer: 59

## 2018-05-16 DIAGNOSIS — Z01810 Encounter for preprocedural cardiovascular examination: Secondary | ICD-10-CM

## 2018-05-16 LAB — CBC
HCT: 27.7 % — ABNORMAL LOW (ref 39.0–52.0)
Hemoglobin: 8.4 g/dL — ABNORMAL LOW (ref 13.0–17.0)
MCH: 25.5 pg — ABNORMAL LOW (ref 26.0–34.0)
MCHC: 30.3 g/dL (ref 30.0–36.0)
MCV: 84.2 fL (ref 80.0–100.0)
Platelets: 183 10*3/uL (ref 150–400)
RBC: 3.29 MIL/uL — ABNORMAL LOW (ref 4.22–5.81)
RDW: 14.3 % (ref 11.5–15.5)
WBC: 7.6 10*3/uL (ref 4.0–10.5)
nRBC: 0 % (ref 0.0–0.2)

## 2018-05-16 LAB — RENAL FUNCTION PANEL
Albumin: 2.5 g/dL — ABNORMAL LOW (ref 3.5–5.0)
Anion gap: 14 (ref 5–15)
BUN: 44 mg/dL — ABNORMAL HIGH (ref 6–20)
CHLORIDE: 97 mmol/L — AB (ref 98–111)
CO2: 24 mmol/L (ref 22–32)
Calcium: 8.7 mg/dL — ABNORMAL LOW (ref 8.9–10.3)
Creatinine, Ser: 9.42 mg/dL — ABNORMAL HIGH (ref 0.61–1.24)
GFR calc Af Amer: 7 mL/min — ABNORMAL LOW (ref >60)
GFR calc non Af Amer: 6 mL/min — ABNORMAL LOW (ref >60)
Glucose, Bld: 102 mg/dL — ABNORMAL HIGH (ref 70–99)
Phosphorus: 7.6 mg/dL — ABNORMAL HIGH (ref 2.5–4.6)
Potassium: 4.5 mmol/L (ref 3.5–5.1)
Sodium: 135 mmol/L (ref 135–145)

## 2018-05-16 LAB — CULTURE, BLOOD (ROUTINE X 2)
Culture: NO GROWTH
Culture: NO GROWTH
Special Requests: ADEQUATE

## 2018-05-16 LAB — GLUCOSE, CAPILLARY
GLUCOSE-CAPILLARY: 109 mg/dL — AB (ref 70–99)
Glucose-Capillary: 93 mg/dL (ref 70–99)
Glucose-Capillary: 125 mg/dL — ABNORMAL HIGH (ref 70–99)
Glucose-Capillary: 155 mg/dL — ABNORMAL HIGH (ref 70–99)

## 2018-05-16 MED ORDER — DARBEPOETIN ALFA 100 MCG/0.5ML IJ SOSY
100.0000 ug | PREFILLED_SYRINGE | INTRAMUSCULAR | Status: DC
  Administered 2018-05-18: 100 ug via INTRAVENOUS
  Filled 2018-05-16: qty 0.5

## 2018-05-16 MED ORDER — CALCITRIOL 0.25 MCG PO CAPS
0.5000 ug | ORAL_CAPSULE | ORAL | Status: DC
  Administered 2018-05-18 – 2018-05-20 (×2): 0.5 ug via ORAL

## 2018-05-16 NOTE — Progress Notes (Signed)
[name] Progress Note   Assessment/ Plan:   1. Progressive CKD --> ESRD: failed transplant.  Transplant in 2014 with a creatinine that was already 4.8 in 03/03/2018 with inadequate tissue from a tx renal bx 11/2017. 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 HD cath placed with IR 12/3, VVS to place access, appreciate assistance.  Complicated access history- venogram showing bilateral axillary vein occlusions; our options are in the thighs.  Tac trough 5.2 Obstruction not likely, PVR 23.   2. DDRT with h/o DGF @ [facility]- prednisone 5 mg daily, MMF 1000 BID, tac 3/2 BID--> 5.2, reduced tac in setting of progressive CKD--> ESRD, will place another trough for tomorrow AM.  D/w Dr. Lissa Merlin, no need for repeat bx.  4. Hypertensive urgency/emergency- pressures initially better on home meds but had recurrence of hypertensive urgency/ emergency.  HD will help this and suspect a large part d/t progressive vol OL; coreg increased.  Hydralazine increased as well, also on clonidine and amlodipine.  Of note, he was on losartan as OP- hold for now, may need to add back (would do this once we are sure there is no chance for renal recovery)  5. DM  6. Secondary HPT- PTH 324, start calcitriol with HD  6. Chronic Anemia- s/p1 u pRBCs, starting ESA with HD  7. OSA- needs to be adherent to CPAP- wearing it  Subjective:    HD yesterday and did well.  No complaints.  Watching football   Objective:   BP 139/82   Pulse 79   Temp 98.4 F (36.9 C)   Resp 18   Ht 6\' 2"  (1.88 m)   Wt (!) 151.8 kg   SpO2 98%   BMI 42.97 kg/m   Physical Exam: Gen: obese, sitting up, talking on the phone CVS: RRR Resp: clear bilaterally Abd: obese Ext: trace LE edema ACCESS: R fem HD cath  Labs: BMET Recent Labs  Lab 05/10/18 2119 05/11/18 0344 05/12/18 0051 05/12/18 1910 05/13/18 0754 05/14/18 0335 05/16/18 0423  NA 139 140 140 139 140 136 135  K 4.4 4.1 4.3 3.5 3.8 3.9  4.5  CL 105 106 107 101 103 99 97*  CO2 15* 14* 15* 27 24 23 24   GLUCOSE 102* 133* 149* 148* 114* 98 102*  BUN 126* 135* 139* 56* 62* 42* 44*  CREATININE 17.81* 18.58* 19.30* 10.58* 12.24* 10.08* 9.42*  CALCIUM 7.3* 7.5* 7.6* 8.0* 8.2* 8.1* 8.7*  PHOS >12.0* 11.2* 14.1* 7.4* 9.8*  --  7.6*   CBC Recent Labs  Lab 05/10/18 1521  05/12/18 1910 05/13/18 0754 05/14/18 0335 05/16/18 0423  WBC 7.8   < > 6.8 6.5 6.5 7.6  NEUTROABS 6.4  --   --   --   --   --   HGB 8.1*   < > 8.8* 8.6* 8.4* 8.4*  HCT 26.6*   < > 27.5* 28.5* 27.4* 27.7*  MCV 82.9   < > 81.4 82.6 83.8 84.2  PLT 141*   < > 161 157 157 183   < > = values in this interval not displayed.    @IMGRELPRIORS @ Medications:    . sodium chloride   Intravenous Once  . aspirin EC  81 mg Oral Daily  . atorvastatin  20 mg Oral Daily  . carvedilol  25 mg Oral BID  . chlorhexidine  15 mL Mouth Rinse BID  . Chlorhexidine Gluconate Cloth  6 each Topical Q0600  . cloNIDine  0.2 mg Oral TID  . heparin  5,000 Units Subcutaneous Q8H  . hydrALAZINE  50 mg Oral Q8H  . insulin aspart  0-9 Units Subcutaneous TID WC  . mouth rinse  15 mL Mouth Rinse q12n4p  . mycophenolate  1,000 mg Oral BID  . omega-3 acid ethyl esters  1 g Oral Daily  . predniSONE  5 mg Oral Q breakfast  . sodium chloride flush  3 mL Intravenous Q12H  . sodium chloride flush  3 mL Intravenous Q12H  . tacrolimus  1.5 mg Oral BID     Madelon Lips, MD 05/16/2018, 3:00 PM

## 2018-05-16 NOTE — Progress Notes (Signed)
ABIs completed - Preliminary results can be found in chart review CV Proc. [place] Khairi Garman,RVS 05/16/2018, 10:22 am

## 2018-05-16 NOTE — Progress Notes (Signed)
Patient places self on CPAP.  RT added sterile water to chamber.

## 2018-05-16 NOTE — Progress Notes (Signed)
PROGRESS NOTE    [name]  TMA:[id number] DOB: 1975/10/06 DOA: 05/10/2018 PCP: Luetta Nutting, DO    Brief Narrative:   Patient is a 42 year old gentleman history of diabetes, hypertension, DD RT from [place] in 2014 after having been on HD short-term PD x7 years initially followed by [facility], history of obstructive sleep apnea, poorly controlled hypertension, morbid obesity who has had progressive worsening of renal function , presented to the ED with volume overload, 20 pounds weight gain, creatinine was noted to be 18.6, had temporary dialysis catheter in the right groin, started on hemodialysis, no improvement of renal function, anticipation for ESRD with low need of dialysis, vascular surgery following regarding permanent access, patient with hypertensive urgency on presentation, difficult to control blood pressure initially, but has significantly improved after initiation of dialysis.  Subjective: -Patient any chest pain, shortness of breath, no nausea or vomiting  Assessment & Plan:   Principal Problem:   Acute kidney injury superimposed on CKD ([place]) Active Problems:   OSA (obstructive sleep apnea)   Essential hypertension   Immunosuppression ([place])   Morbid obesity ([place])   Kidney replaced by transplant   Type 2 diabetes mellitus without complication, without long-term current use of insulin ([facility])   Hypertensive urgency   Acidosis   Volume overload   Anemia   Hypertensive emergency  Progressive CKD, transitioning to end-stage renal disease -Creatinine was 4.8 in September of this year,18 on admission, he has a transplant, appears to be failing, only on dialysis, management per renal -Patient with renal biopsy June of this year, inadequate tissue, per renal there is a strong suspicion for secondary FSGS obesity with a UPC of 6 -Patient had temporary hemodialysis catheter in right groin per IR, vascular surgery following for permanent access, status post  venogram 05/13/2018, showing bilateral axillary vein occlusions, discussed with  vascular surgery, plan for right groin access next Tuesday . -Dialysis per renal   Renal transplant patient -Continue with immunosuppressive therapy  -Discussed with nephrology , there is no need to repeat biopsy  Hypertensive emergency/urgency -Blood pressure extremely difficult to control on admission, he is on multiple medication to achieve acceptable control, as well it continues to improve as he receives dialysis and volume status is improving . -Blood pressure is much better controlled after being started on hemodialysis, had to decrease some of his medications, so blood pressure is currently acceptable on current regimen of hydralazine 50 mg 3 times a day, clonidine, Coreg, on PRN hydralazine.  Amlodipine has been stopped  Anemia of chronic kidney disease -He was transfused 1 unit PRBC -Epogen per renal  Acidosis Secondary to failure.  Patient to have tunneled HD catheter placed today in anticipation of starting hemodialysis today.  Per nephrology.  Well controlled diabetes mellitus 2 Hemoglobin A1c at 5.8.  CBG of 86 this morning.  Continue to hold home oral hypoglycemic agents.  Continue sliding scale insulin.   Hyperlipidemia LDL elevated at 189   Continue home regimen statin.  Morbid obesity  Obstructive sleep apnea CPAP nightly.  DVT prophylaxis: Heparin Code Status: Full Family Communication: Updated patient.  No family at bedside. Disposition Plan: Home when stable, awaiting permanent access, and clip   Consultants:   Nephrology   interventional radiology   Vascular surgery  Procedures:   Ultrasound of transplanted kidney 05/10/2018 Chest x-ray 05/10/2018  Right groin temporary dialysis catheter  Antimicrobials:   None      Objective: Vitals:   05/15/18 2114 05/16/18 0457 05/16/18 0925  05/16/18 1258  BP: 131/86 128/81 (!) 162/92 139/82  Pulse: 80 72 75 79    Resp: 18 18    Temp: 98.6 F (37 C) 98.4 F (36.9 C)    TempSrc:      SpO2: 95% 98%    Weight:      Height:        Intake/Output Summary (Last 24 hours) at 05/16/2018 1400 Last data filed at 05/16/2018 0915 Gross per 24 hour  Intake 360 ml  Output -  Net 360 ml   Filed Weights   05/15/18 0607 05/15/18 0732 05/15/18 1138  Weight: (!) 155.5 kg (!) 155 kg (!) 151.8 kg    Examination:  Awake Alert, Oriented X 3, No new F.N deficits, Normal affect Symmetrical Chest wall movement, Good air movement bilaterally, CTAB RRR,No Gallops,Rubs or new Murmurs, No Parasternal Heave +ve B.Sounds, Abd Soft, No tenderness, No rebound - guarding or rigidity. No Cyanosis, Clubbing or edema, No new Rash or bruise       Data Reviewed: I have personally reviewed following labs and imaging studies  CBC: Recent Labs  Lab 05/10/18 1521  05/12/18 0051 05/12/18 1910 05/13/18 0754 05/14/18 0335 05/16/18 0423  WBC 7.8   < > 7.9 6.8 6.5 6.5 7.6  NEUTROABS 6.4  --   --   --   --   --   --   HGB 8.1*   < > 7.7* 8.8* 8.6* 8.4* 8.4*  HCT 26.6*   < > 25.9* 27.5* 28.5* 27.4* 27.7*  MCV 82.9   < > 84.1 81.4 82.6 83.8 84.2  PLT 141*   < > 164 161 157 157 183   < > = values in this interval not displayed.   Basic Metabolic Panel: Recent Labs  Lab 05/11/18 0344 05/12/18 0051 05/12/18 1910 05/13/18 0754 05/14/18 0335 05/16/18 0423  NA 140 140 139 140 136 135  K 4.1 4.3 3.5 3.8 3.9 4.5  CL 106 107 101 103 99 97*  CO2 14* 15* 27 24 23 24   GLUCOSE 133* 149* 148* 114* 98 102*  BUN 135* 139* 56* 62* 42* 44*  CREATININE 18.58* 19.30* 10.58* 12.24* 10.08* 9.42*  CALCIUM 7.5* 7.6* 8.0* 8.2* 8.1* 8.7*  PHOS 11.2* 14.1* 7.4* 9.8*  --  7.6*   GFR: Estimated Creatinine Clearance: 15.7 mL/min (A) (by C-G formula based on SCr of 9.42 mg/dL (H)). Liver Function Tests: Recent Labs  Lab 05/10/18 1521  05/11/18 0050 05/11/18 0344 05/12/18 0051 05/12/18 1910 05/13/18 0754 05/16/18 0423  AST 10*   --   --  8*  --   --   --   --   ALT 12  --  10 11  --   --   --   --   ALKPHOS 38  --   --  36*  --   --   --   --   BILITOT 0.6  --   --  0.6  --   --   --   --   PROT 5.8*  --   --  5.6*  --   --   --   --   ALBUMIN 2.5*   < >  --  2.7* 2.3* 2.4* 2.3* 2.5*   < > = values in this interval not displayed.   No results for input(s): LIPASE, AMYLASE in the last 168 hours. No results for input(s): AMMONIA in the last 168 hours. Coagulation Profile: Recent Labs  Lab 05/10/18 2119 05/11/18  0344  INR 1.16 1.21   Cardiac Enzymes: No results for input(s): CKTOTAL, CKMB, CKMBINDEX, TROPONINI in the last 168 hours. BNP (last 3 results) No results for input(s): PROBNP in the last 8760 hours. HbA1C: No results for input(s): HGBA1C in the last 72 hours. CBG: Recent Labs  Lab 05/15/18 1250 05/15/18 1631 05/15/18 2112 05/16/18 0839 05/16/18 1255  GLUCAP 91 104* 167* 93 125*   Lipid Profile: No results for input(s): CHOL, HDL, LDLCALC, TRIG, CHOLHDL, LDLDIRECT in the last 72 hours. Thyroid Function Tests: No results for input(s): TSH, T4TOTAL, FREET4, T3FREE, THYROIDAB in the last 72 hours. Anemia Panel: No results for input(s): VITAMINB12, FOLATE, FERRITIN, TIBC, IRON, RETICCTPCT in the last 72 hours. Sepsis Labs: No results for input(s): PROCALCITON, LATICACIDVEN in the last 168 hours.  Recent Results (from the past 240 hour(s))  Urine Culture     Status: None   Collection Time: 05/10/18  6:14 PM  Result Value Ref Range Status   Specimen Description URINE, CLEAN CATCH  Final   Special Requests NONE  Final   Culture   Final    NO GROWTH Performed at [facility], [address] [place], [place] [address]    Report Status 05/11/2018 FINAL  Final  Culture, blood (Routine X 2) w Reflex to ID Panel     Status: None (Preliminary result)   Collection Time: 05/11/18 11:32 AM  Result Value Ref Range Status   Specimen Description BLOOD BLOOD LEFT FOREARM  Final   Special  Requests   Final    BOTTLES DRAWN AEROBIC ONLY Blood Culture adequate volume   Culture   Final    NO GROWTH 4 DAYS Performed at [facility], [place] [address], [place], [place] [address]    Report Status PENDING  Incomplete  Culture, blood (Routine X 2) w Reflex to ID Panel     Status: None (Preliminary result)   Collection Time: 05/11/18  6:24 PM  Result Value Ref Range Status   Specimen Description BLOOD LEFT ANTECUBITAL  Final   Special Requests   Final    BOTTLES DRAWN AEROBIC ONLY Blood Culture results may not be optimal due to an inadequate volume of blood received in culture bottles   Culture   Final    NO GROWTH 4 DAYS Performed at [facility], [place] [address] [place], [place] [address]    Report Status PENDING  Incomplete  MRSA PCR Screening     Status: None   Collection Time: 05/11/18  6:30 PM  Result Value Ref Range Status   MRSA by PCR NEGATIVE NEGATIVE Final    Comment:        The GeneXpert MRSA Assay (FDA approved for NASAL specimens only), is one component of a comprehensive MRSA colonization surveillance program. It is not intended to diagnose MRSA infection nor to guide or monitor treatment for MRSA infections. Performed at [facility], [place] [address], [place], [place] [address]          Radiology Studies: Vas [place] Abi With/wo Tbi  Result Date: 05/16/2018 LOWER EXTREMITY DOPPLER STUDY Indications: Pre Operative for Thigh dialysis graft.  Performing Technologist: Birdena Crandall, [place] [facility]  Examination Guidelines: A complete evaluation includes at minimum, Doppler waveform signals and systolic blood pressure reading at the level of bilateral brachial, anterior tibial, and posterior tibial arteries, when vessel segments are accessible. Bilateral testing is considered an integral part of a complete examination. Photoelectric Plethysmograph (PPG) waveforms and toe systolic  pressure readings are included as required and additional duplex testing as  needed. Limited examinations for reoccurring indications may be performed as noted.  ABI Findings: +--------+------------------+-----+---------+--------+ Right   Rt Pressure (mmHg)IndexWaveform Comment  +--------+------------------+-----+---------+--------+ [id number]                    triphasic         +--------+------------------+-----+---------+--------+ PTA     181               1.18 triphasic         +--------+------------------+-----+---------+--------+ DP      152               0.99 triphasic         +--------+------------------+-----+---------+--------+ +--------+------------------+-----+---------+-------+ Left    Lt Pressure (mmHg)IndexWaveform Comment +--------+------------------+-----+---------+-------+ [id number]                    triphasic        +--------+------------------+-----+---------+-------+ PTA     151               0.99 triphasic        +--------+------------------+-----+---------+-------+ DP      148               0.97 triphasic        +--------+------------------+-----+---------+-------+ +-------+-----------+-----------+------------+------------+ ABI/TBIToday's ABIToday's TBIPrevious ABIPrevious TBI +-------+-----------+-----------+------------+------------+ Right  1.18                                           +-------+-----------+-----------+------------+------------+ Left   0.99                                           +-------+-----------+-----------+------------+------------+  Summary: Right: Resting right ankle-brachial index is within normal range. No evidence of significant right lower extremity arterial disease. Left: Resting left ankle-brachial index is within normal range. No evidence of significant left lower extremity arterial disease.  *See table(s) above for measurements and observations.    Preliminary         Scheduled Meds: . sodium chloride   Intravenous Once  . aspirin EC  81 mg Oral Daily  .  atorvastatin  20 mg Oral Daily  . carvedilol  25 mg Oral BID  . chlorhexidine  15 mL Mouth Rinse BID  . Chlorhexidine Gluconate Cloth  6 each Topical Q0600  . cloNIDine  0.2 mg Oral TID  . heparin  5,000 Units Subcutaneous Q8H  . hydrALAZINE  50 mg Oral Q8H  . insulin aspart  0-9 Units Subcutaneous TID WC  . mouth rinse  15 mL Mouth Rinse q12n4p  . mycophenolate  1,000 mg Oral BID  . omega-3 acid ethyl esters  1 g Oral Daily  . predniSONE  5 mg Oral Q breakfast  . sodium chloride flush  3 mL Intravenous Q12H  . sodium chloride flush  3 mL Intravenous Q12H  . tacrolimus  1.5 mg Oral BID   Continuous Infusions: . sodium chloride       LOS: 6 days    Phillips Climes, MD [facility] Pager [phone] If 7PM-7AM, please contact night-coverage [email or web address] Password [email or web address] 05/16/2018, 2:00 PM

## 2018-05-17 LAB — GLUCOSE, CAPILLARY
Glucose-Capillary: 79 mg/dL (ref 70–99)
Glucose-Capillary: 101 mg/dL — ABNORMAL HIGH (ref 70–99)
Glucose-Capillary: 104 mg/dL — ABNORMAL HIGH (ref 70–99)
Glucose-Capillary: 188 mg/dL — ABNORMAL HIGH (ref 70–99)

## 2018-05-17 MED ORDER — DEXTROSE 5 % IV SOLN
3.0000 g | INTRAVENOUS | Status: AC
  Administered 2018-05-18: 3 g via INTRAVENOUS
  Filled 2018-05-17 (×2): qty 3000

## 2018-05-17 NOTE — Progress Notes (Signed)
PROGRESS NOTE    [name] San  OJJ:[id number] DOB: Feb 02, 1976 DOA: 05/10/2018 PCP: Luetta Nutting, DO    Brief Narrative:   Patient is a 42 year old gentleman history of diabetes, hypertension, DD RT from [place] in 2014 after having been on HD short-term PD x7 years initially followed by [facility], history of obstructive sleep apnea, poorly controlled hypertension, morbid obesity who has had progressive worsening of renal function , presented to the ED with volume overload, 20 pounds weight gain, creatinine was noted to be 18.6, had temporary dialysis catheter in the right groin, started on hemodialysis, no improvement of renal function, anticipation for ESRD with low need of dialysis, vascular surgery following regarding permanent access, patient with hypertensive urgency on presentation, difficult to control blood pressure initially, but has significantly improved after initiation of dialysis.  Subjective: -Patient any chest pain, shortness of breath, no nausea or vomiting  Assessment & Plan:   Principal Problem:   Acute kidney injury superimposed on CKD ([place]) Active Problems:   OSA (obstructive sleep apnea)   Essential hypertension   Immunosuppression ([place])   Morbid obesity ([place])   Kidney replaced by transplant   Type 2 diabetes mellitus without complication, without long-term current use of insulin ([facility])   Hypertensive urgency   Acidosis   Volume overload   Anemia   Hypertensive emergency  Progressive CKD, transitioning to end-stage renal disease -Creatinine was 4.8 in September of this year,18 on admission, he has a transplant, appears to be failing, only on dialysis, management per renal -Patient with renal biopsy June of this year, inadequate tissue, per renal there is a strong suspicion for secondary FSGS obesity with a UPC of 6 -Patient had temporary hemodialysis catheter in right groin per IR, vascular surgery following for permanent access, status post  venogram 05/13/2018, showing bilateral axillary vein occlusions, discussed with  vascular surgery, plan for right groin access next Tuesday . -Dialysis per renal   Renal transplant patient -Continue with immunosuppressive therapy  -Discussed with nephrology , there is no need to repeat biopsy  Hypertensive emergency/urgency -Blood pressure extremely difficult to control on admission, he is on multiple medication to achieve acceptable control, as well it continues to improve as he receives dialysis and volume status is improving . -Blood pressure is much better controlled after being started on hemodialysis, had to decrease some of his medications, so blood pressure is currently acceptable on current regimen of hydralazine 50 mg 3 times a day, clonidine, Coreg, on PRN hydralazine.  Amlodipine has been stopped  Anemia of chronic kidney disease -He was transfused 1 unit PRBC -Epogen per renal  Acidosis Secondary to failure.  Patient to have tunneled HD catheter placed today in anticipation of starting hemodialysis today.  Per nephrology.  Well controlled diabetes mellitus 2 Hemoglobin A1c at 5.8.  CBG of 86 this morning.  Continue to hold home oral hypoglycemic agents.  Continue sliding scale insulin.   Hyperlipidemia LDL elevated at 189   Continue home regimen statin.  Morbid obesity  Obstructive sleep apnea CPAP nightly.  DVT prophylaxis: Heparin Code Status: Full Family Communication: Updated patient.  No family at bedside. Disposition Plan: Home when stable, awaiting permanent access, and clip   Consultants:   Nephrology   interventional radiology   Vascular surgery  Procedures:   Ultrasound of transplanted kidney 05/10/2018 Chest x-ray 05/10/2018  Right groin temporary dialysis catheter  Antimicrobials:   None      Objective: Vitals:   05/16/18 1716 05/16/18 2212 05/17/18 9371  05/17/18 0957  BP: 137/82 (!) 143/83 (!) 143/84 138/87  Pulse: 70 75 77 83    Resp: 20 19    Temp: 98.3 F (36.8 C) 97.7 F (36.5 C) 98.6 F (37 C)   TempSrc: Oral     SpO2: 99% 97% 97%   Weight:      Height:        Intake/Output Summary (Last 24 hours) at 05/17/2018 1201 Last data filed at 05/17/2018 0526 Gross per 24 hour  Intake 150 ml  Output -  Net 150 ml   Filed Weights   05/15/18 0607 05/15/18 0732 05/15/18 1138  Weight: (!) 155.5 kg (!) 155 kg (!) 151.8 kg    Examination:  Awake Alert, Oriented X 3, No new F.N deficits, Normal affect Symmetrical Chest wall movement, Good air movement bilaterally, CTAB RRR,No Gallops,Rubs or new Murmurs, No Parasternal Heave +ve B.Sounds, Abd Soft, No tenderness, No rebound - guarding or rigidity. No Cyanosis, Clubbing or edema, No new Rash or bruise        Data Reviewed: I have personally reviewed following labs and imaging studies  CBC: Recent Labs  Lab 05/10/18 1521  05/12/18 0051 05/12/18 1910 05/13/18 0754 05/14/18 0335 05/16/18 0423  WBC 7.8   < > 7.9 6.8 6.5 6.5 7.6  NEUTROABS 6.4  --   --   --   --   --   --   HGB 8.1*   < > 7.7* 8.8* 8.6* 8.4* 8.4*  HCT 26.6*   < > 25.9* 27.5* 28.5* 27.4* 27.7*  MCV 82.9   < > 84.1 81.4 82.6 83.8 84.2  PLT 141*   < > 164 161 157 157 183   < > = values in this interval not displayed.   Basic Metabolic Panel: Recent Labs  Lab 05/11/18 0344 05/12/18 0051 05/12/18 1910 05/13/18 0754 05/14/18 0335 05/16/18 0423  NA 140 140 139 140 136 135  K 4.1 4.3 3.5 3.8 3.9 4.5  CL 106 107 101 103 99 97*  CO2 14* 15* 27 24 23 24   GLUCOSE 133* 149* 148* 114* 98 102*  BUN 135* 139* 56* 62* 42* 44*  CREATININE 18.58* 19.30* 10.58* 12.24* 10.08* 9.42*  CALCIUM 7.5* 7.6* 8.0* 8.2* 8.1* 8.7*  PHOS 11.2* 14.1* 7.4* 9.8*  --  7.6*   GFR: Estimated Creatinine Clearance: 15.7 mL/min (A) (by C-G formula based on SCr of 9.42 mg/dL (H)). Liver Function Tests: Recent Labs  Lab 05/10/18 1521  05/11/18 0050 05/11/18 0344 05/12/18 0051 05/12/18 1910 05/13/18 0754  05/16/18 0423  AST 10*  --   --  8*  --   --   --   --   ALT 12  --  10 11  --   --   --   --   ALKPHOS 38  --   --  36*  --   --   --   --   BILITOT 0.6  --   --  0.6  --   --   --   --   PROT 5.8*  --   --  5.6*  --   --   --   --   ALBUMIN 2.5*   < >  --  2.7* 2.3* 2.4* 2.3* 2.5*   < > = values in this interval not displayed.   No results for input(s): LIPASE, AMYLASE in the last 168 hours. No results for input(s): AMMONIA in the last 168 hours. Coagulation Profile: Recent Labs  Lab 05/10/18 2119 05/11/18 0344  INR 1.16 1.21   Cardiac Enzymes: No results for input(s): CKTOTAL, CKMB, CKMBINDEX, TROPONINI in the last 168 hours. BNP (last 3 results) No results for input(s): PROBNP in the last 8760 hours. HbA1C: No results for input(s): HGBA1C in the last 72 hours. CBG: Recent Labs  Lab 05/16/18 0839 05/16/18 1255 05/16/18 1712 05/16/18 2210 05/17/18 0813  GLUCAP 93 125* 109* 155* 79   Lipid Profile: No results for input(s): CHOL, HDL, LDLCALC, TRIG, CHOLHDL, LDLDIRECT in the last 72 hours. Thyroid Function Tests: No results for input(s): TSH, T4TOTAL, FREET4, T3FREE, THYROIDAB in the last 72 hours. Anemia Panel: No results for input(s): VITAMINB12, FOLATE, FERRITIN, TIBC, IRON, RETICCTPCT in the last 72 hours. Sepsis Labs: No results for input(s): PROCALCITON, LATICACIDVEN in the last 168 hours.  Recent Results (from the past 240 hour(s))  Urine Culture     Status: None   Collection Time: 05/10/18  6:14 PM  Result Value Ref Range Status   Specimen Description URINE, CLEAN CATCH  Final   Special Requests NONE  Final   Culture   Final    NO GROWTH Performed at [facility], [address], [place], [place] [address]    Report Status 05/11/2018 FINAL  Final  Culture, blood (Routine X 2) w Reflex to ID Panel     Status: None   Collection Time: 05/11/18 11:32 AM  Result Value Ref Range Status   Specimen Description BLOOD BLOOD LEFT FOREARM  Final   Special  Requests   Final    BOTTLES DRAWN AEROBIC ONLY Blood Culture adequate volume   Culture   Final    NO GROWTH 5 DAYS Performed at [facility], [place] [address], [place], [place] [address]    Report Status 05/16/2018 FINAL  Final  Culture, blood (Routine X 2) w Reflex to ID Panel     Status: None   Collection Time: 05/11/18  6:24 PM  Result Value Ref Range Status   Specimen Description BLOOD LEFT ANTECUBITAL  Final   Special Requests   Final    BOTTLES DRAWN AEROBIC ONLY Blood Culture results may not be optimal due to an inadequate volume of blood received in culture bottles   Culture   Final    NO GROWTH 5 DAYS Performed at [facility], [place] [address] [place], [place] [address]    Report Status 05/16/2018 FINAL  Final  MRSA PCR Screening     Status: None   Collection Time: 05/11/18  6:30 PM  Result Value Ref Range Status   MRSA by PCR NEGATIVE NEGATIVE Final    Comment:        The GeneXpert MRSA Assay (FDA approved for NASAL specimens only), is one component of a comprehensive MRSA colonization surveillance program. It is not intended to diagnose MRSA infection nor to guide or monitor treatment for MRSA infections. Performed at [facility], [place] [address], [place], [place] [address]          Radiology Studies: Vas [place] Abi With/wo Tbi  Result Date: 05/16/2018 LOWER EXTREMITY DOPPLER STUDY Indications: Pre Operative for Thigh dialysis graft.  Performing Technologist: Birdena Crandall, [place] [facility]  Examination Guidelines: A complete evaluation includes at minimum, Doppler waveform signals and systolic blood pressure reading at the level of bilateral brachial, anterior tibial, and posterior tibial arteries, when vessel segments are accessible. Bilateral testing is considered an integral part of a complete examination. Photoelectric Plethysmograph (PPG) waveforms and  toe systolic pressure readings are included as required and additional duplex testing as needed.  Limited examinations for reoccurring indications may be performed as noted.  ABI Findings: +--------+------------------+-----+---------+--------+ Right   Rt Pressure (mmHg)IndexWaveform Comment  +--------+------------------+-----+---------+--------+ [id number]                    triphasic         +--------+------------------+-----+---------+--------+ PTA     181               1.18 triphasic         +--------+------------------+-----+---------+--------+ DP      152               0.99 triphasic         +--------+------------------+-----+---------+--------+ +--------+------------------+-----+---------+-------+ Left    Lt Pressure (mmHg)IndexWaveform Comment +--------+------------------+-----+---------+-------+ [id number]                    triphasic        +--------+------------------+-----+---------+-------+ PTA     151               0.99 triphasic        +--------+------------------+-----+---------+-------+ DP      148               0.97 triphasic        +--------+------------------+-----+---------+-------+ +-------+-----------+-----------+------------+------------+ ABI/TBIToday's ABIToday's TBIPrevious ABIPrevious TBI +-------+-----------+-----------+------------+------------+ Right  1.18                                           +-------+-----------+-----------+------------+------------+ Left   0.99                                           +-------+-----------+-----------+------------+------------+  Summary: Right: Resting right ankle-brachial index is within normal range. No evidence of significant right lower extremity arterial disease. Left: Resting left ankle-brachial index is within normal range. No evidence of significant left lower extremity arterial disease.  *See table(s) above for measurements and observations.  Electronically signed by Harold Barban MD on 05/16/2018 at 2:45:17 PM.   Final         Scheduled Meds: . sodium chloride    Intravenous Once  . aspirin EC  81 mg Oral Daily  . atorvastatin  20 mg Oral Daily  . [START ON 05/18/2018] calcitRIOL  0.5 mcg Oral Q T,Th,Sa-HD  . carvedilol  25 mg Oral BID  . chlorhexidine  15 mL Mouth Rinse BID  . Chlorhexidine Gluconate Cloth  6 each Topical Q0600  . cloNIDine  0.2 mg Oral TID  . [START ON 05/18/2018] darbepoetin (ARANESP) injection - DIALYSIS  100 mcg Intravenous Q Tue-HD  . heparin  5,000 Units Subcutaneous Q8H  . hydrALAZINE  50 mg Oral Q8H  . insulin aspart  0-9 Units Subcutaneous TID WC  . mouth rinse  15 mL Mouth Rinse q12n4p  . mycophenolate  1,000 mg Oral BID  . omega-3 acid ethyl esters  1 g Oral Daily  . predniSONE  5 mg Oral Q breakfast  . sodium chloride flush  3 mL Intravenous Q12H  . sodium chloride flush  3 mL Intravenous Q12H  . tacrolimus  1.5 mg Oral BID   Continuous Infusions: . sodium chloride    . [START ON 05/18/2018]  ceFAZolin (ANCEF) IV  LOS: 7 days    Phillips Climes, MD [facility] Pager [phone] If 7PM-7AM, please contact night-coverage [email or web address] Password [email or web address] 05/17/2018, 12:01 PM

## 2018-05-17 NOTE — Progress Notes (Signed)
  Progress Note    05/17/2018 1:24 PM 4 Days Post-Op  Subjective: no acute issues  Vitals:   05/17/18 0957 05/17/18 1228  BP: 138/87 (!) 150/92  Pulse: 83 68  Resp:    Temp:  97.6 F (36.4 C)  SpO2:  90%    Physical Exam: aaox3 Non labored respirations Palpable pedal pulses  CBC    Component Value Date/Time   WBC 7.6 05/16/2018 0423   RBC 3.29 (L) 05/16/2018 0423   HGB 8.4 (L) 05/16/2018 0423   HCT 27.7 (L) 05/16/2018 0423   PLT 183 05/16/2018 0423   MCV 84.2 05/16/2018 0423   MCH 25.5 (L) 05/16/2018 0423   MCHC 30.3 05/16/2018 0423   RDW 14.3 05/16/2018 0423   LYMPHSABS 0.4 (L) 05/10/2018 1521   MONOABS 0.7 05/10/2018 1521   EOSABS 0.2 05/10/2018 1521   BASOSABS 0.0 05/10/2018 1521    BMET    Component Value Date/Time   NA 135 05/16/2018 0423   K 4.5 05/16/2018 0423   CL 97 (L) 05/16/2018 0423   CO2 24 05/16/2018 0423   GLUCOSE 102 (H) 05/16/2018 0423   BUN 44 (H) 05/16/2018 0423   CREATININE 9.42 (H) 05/16/2018 0423   CALCIUM 8.7 (L) 05/16/2018 0423   GFRNONAA 6 (L) 05/16/2018 0423   GFRAA 7 (L) 05/16/2018 0423    INR    Component Value Date/Time   INR 1.21 05/11/2018 0344     Intake/Output Summary (Last 24 hours) at 05/17/2018 1324 Last data filed at 05/17/2018 0526 Gross per 24 hour  Intake 150 ml  Output -  Net 150 ml     Assessment:  42 y.o. male is s/p venogram that demonstrated occluded subclavian veins bilaterally.  Plan: OR tomorrow for right femoral AV loop graft.  We will possibly place a left femoral TDC if the vein is patent at the time he will otherwise need a quick stick graft.  I discussed this with the patient including the risk and benefits and he agrees to proceed.  N.p.o. past midnight.  [name] Matters, MD [facility] Office: [phone] Pager: [phone]  05/17/2018 1:24 PM

## 2018-05-17 NOTE — Progress Notes (Signed)
[facility] ROUNDING NOTE   Subjective:   He has been doing well no complaints this morning.  Blood pressure 143/84 pulse 77 temperature 98.7 O2 sats 97%  Labs sodium 135 potassium 4.5 chloride 97 CO2 24 BUN 44 creatinine 9.42 glucose 102 calcium 8.7 phosphorus 7.6 albumin 2.5 WBC 7.6 hemoglobin 8.4 platelets 183  Objective:  Vital signs in last 24 hours:  Temp:  [97.7 F (36.5 C)-98.6 F (37 C)] 98.6 F (37 C) (12/09 0511) Pulse Rate:  [70-83] 83 (12/09 0957) Resp:  [19-20] 19 (12/08 2212) BP: (137-143)/(82-87) 138/87 (12/09 0957) SpO2:  [97 %-99 %] 97 % (12/09 0511)  Weight change:  Filed Weights   05/15/18 0607 05/15/18 0732 05/15/18 1138  Weight: (!) 155.5 kg (!) 155 kg (!) 151.8 kg    Intake/Output: I/O last 3 completed shifts: In: 510 [P.O.:510] Out: -    Intake/Output this shift:  No intake/output data recorded.  CVS- RRR RS- CTA ABD- BS present soft non-distended EXT- no edema   right femoral hemodialysis catheter trace lower extremity edema   Basic Metabolic Panel: Recent Labs  Lab 05/11/18 0344 05/12/18 0051 05/12/18 1910 05/13/18 0754 05/14/18 0335 05/16/18 0423  NA 140 140 139 140 136 135  K 4.1 4.3 3.5 3.8 3.9 4.5  CL 106 107 101 103 99 97*  CO2 14* 15* 27 24 23 24   GLUCOSE 133* 149* 148* 114* 98 102*  BUN 135* 139* 56* 62* 42* 44*  CREATININE 18.58* 19.30* 10.58* 12.24* 10.08* 9.42*  CALCIUM 7.5* 7.6* 8.0* 8.2* 8.1* 8.7*  PHOS 11.2* 14.1* 7.4* 9.8*  --  7.6*    Liver Function Tests: Recent Labs  Lab 05/10/18 1521  05/11/18 0050 05/11/18 0344 05/12/18 0051 05/12/18 1910 05/13/18 0754 05/16/18 0423  AST 10*  --   --  8*  --   --   --   --   ALT 12  --  10 11  --   --   --   --   ALKPHOS 38  --   --  36*  --   --   --   --   BILITOT 0.6  --   --  0.6  --   --   --   --   PROT 5.8*  --   --  5.6*  --   --   --   --   ALBUMIN 2.5*   < >  --  2.7* 2.3* 2.4* 2.3* 2.5*   < > = values in this interval not displayed.    No results for input(s): LIPASE, AMYLASE in the last 168 hours. No results for input(s): AMMONIA in the last 168 hours.  CBC: Recent Labs  Lab 05/10/18 1521  05/12/18 0051 05/12/18 1910 05/13/18 0754 05/14/18 0335 05/16/18 0423  WBC 7.8   < > 7.9 6.8 6.5 6.5 7.6  NEUTROABS 6.4  --   --   --   --   --   --   HGB 8.1*   < > 7.7* 8.8* 8.6* 8.4* 8.4*  HCT 26.6*   < > 25.9* 27.5* 28.5* 27.4* 27.7*  MCV 82.9   < > 84.1 81.4 82.6 83.8 84.2  PLT 141*   < > 164 161 157 157 183   < > = values in this interval not displayed.    Cardiac Enzymes: No results for input(s): CKTOTAL, CKMB, CKMBINDEX, TROPONINI in the last 168 hours.  BNP: Invalid input(s): POCBNP  CBG: Recent Labs  Lab 05/16/18 0839 05/16/18 1255 05/16/18 1712 05/16/18 2210 05/17/18 0813  GLUCAP 93 125* 109* 155* 44    Microbiology: Results for orders placed or performed during the hospital encounter of 05/10/18  Urine Culture     Status: None   Collection Time: 05/10/18  6:14 PM  Result Value Ref Range Status   Specimen Description URINE, CLEAN CATCH  Final   Special Requests NONE  Final   Culture   Final    NO GROWTH Performed at [facility], [place] [address], [place], [place] [address]    Report Status 05/11/2018 FINAL  Final  Culture, blood (Routine X 2) w Reflex to ID Panel     Status: None   Collection Time: 05/11/18 11:32 AM  Result Value Ref Range Status   Specimen Description BLOOD BLOOD LEFT FOREARM  Final   Special Requests   Final    BOTTLES DRAWN AEROBIC ONLY Blood Culture adequate volume   Culture   Final    NO GROWTH 5 DAYS Performed at [facility], [place] [address] [place], [place] [address]    Report Status 05/16/2018 FINAL  Final  Culture, blood (Routine X 2) w Reflex to ID Panel     Status: None   Collection Time: 05/11/18  6:24 PM  Result Value Ref Range Status   Specimen Description BLOOD LEFT ANTECUBITAL  Final   Special Requests   Final    BOTTLES DRAWN AEROBIC  ONLY Blood Culture results may not be optimal due to an inadequate volume of blood received in culture bottles   Culture   Final    NO GROWTH 5 DAYS Performed at [facility], [place] [address] [place], [place] [address]    Report Status 05/16/2018 FINAL  Final  MRSA PCR Screening     Status: None   Collection Time: 05/11/18  6:30 PM  Result Value Ref Range Status   MRSA by PCR NEGATIVE NEGATIVE Final    Comment:        The GeneXpert MRSA Assay (FDA approved for NASAL specimens only), is one component of a comprehensive MRSA colonization surveillance program. It is not intended to diagnose MRSA infection nor to guide or monitor treatment for MRSA infections. Performed at [facility], [place] [address], [place], [place] [address]     Coagulation Studies: No results for input(s): LABPROT, INR in the last 72 hours.  Urinalysis: No results for input(s): COLORURINE, LABSPEC, PHURINE, GLUCOSEU, HGBUR, BILIRUBINUR, KETONESUR, PROTEINUR, UROBILINOGEN, NITRITE, LEUKOCYTESUR in the last 72 hours.  Invalid input(s): APPERANCEUR    Imaging: Vas [place] Abi With/wo Tbi  Result Date: 05/16/2018 LOWER EXTREMITY DOPPLER STUDY Indications: Pre Operative for Thigh dialysis graft.  Performing Technologist: Birdena Crandall, [place] [facility]  Examination Guidelines: A complete evaluation includes at minimum, Doppler waveform signals and systolic blood pressure reading at the level of bilateral brachial, anterior tibial, and posterior tibial arteries, when vessel segments are accessible. Bilateral testing is considered an integral part of a complete examination. Photoelectric Plethysmograph (PPG) waveforms and toe systolic pressure readings are included as required and additional duplex testing as needed. Limited examinations for reoccurring indications may be performed as noted.  ABI Findings: +--------+------------------+-----+---------+--------+ Right   Rt Pressure (mmHg)IndexWaveform Comment   +--------+------------------+-----+---------+--------+ [id number]                    triphasic         +--------+------------------+-----+---------+--------+ PTA     181  1.18 triphasic         +--------+------------------+-----+---------+--------+ DP      152               0.99 triphasic         +--------+------------------+-----+---------+--------+ +--------+------------------+-----+---------+-------+ Left    Lt Pressure (mmHg)IndexWaveform Comment +--------+------------------+-----+---------+-------+ [id number]                    triphasic        +--------+------------------+-----+---------+-------+ PTA     151               0.99 triphasic        +--------+------------------+-----+---------+-------+ DP      148               0.97 triphasic        +--------+------------------+-----+---------+-------+ +-------+-----------+-----------+------------+------------+ ABI/TBIToday's ABIToday's TBIPrevious ABIPrevious TBI +-------+-----------+-----------+------------+------------+ Right  1.18                                           +-------+-----------+-----------+------------+------------+ Left   0.99                                           +-------+-----------+-----------+------------+------------+  Summary: Right: Resting right ankle-brachial index is within normal range. No evidence of significant right lower extremity arterial disease. Left: Resting left ankle-brachial index is within normal range. No evidence of significant left lower extremity arterial disease.  *See table(s) above for measurements and observations.  Electronically signed by Harold Barban MD on 05/16/2018 at 2:45:17 PM.   Final      Medications:   . sodium chloride    . [START ON 05/18/2018]  ceFAZolin (ANCEF) IV     . sodium chloride   Intravenous Once  . aspirin EC  81 mg Oral Daily  . atorvastatin  20 mg Oral Daily  . [START ON 05/18/2018] calcitRIOL  0.5 mcg  Oral Q T,Th,Sa-HD  . carvedilol  25 mg Oral BID  . chlorhexidine  15 mL Mouth Rinse BID  . Chlorhexidine Gluconate Cloth  6 each Topical Q0600  . cloNIDine  0.2 mg Oral TID  . [START ON 05/18/2018] darbepoetin (ARANESP) injection - DIALYSIS  100 mcg Intravenous Q Tue-HD  . heparin  5,000 Units Subcutaneous Q8H  . hydrALAZINE  50 mg Oral Q8H  . insulin aspart  0-9 Units Subcutaneous TID WC  . mouth rinse  15 mL Mouth Rinse q12n4p  . mycophenolate  1,000 mg Oral BID  . omega-3 acid ethyl esters  1 g Oral Daily  . predniSONE  5 mg Oral Q breakfast  . sodium chloride flush  3 mL Intravenous Q12H  . sodium chloride flush  3 mL Intravenous Q12H  . 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  Does have complicated history and options are limited.  We will start the clip process  DDRT with history of DGF at [facility] patient using 5 mg of prednisone  daily MMF 1 g twice daily tacrolimus 1.5 twice daily, recent decreased dose 05/16/2018.  Has been being evaluated and seen at [facility] and not considered a candidate for repeat biopsy of transplant kidney  Hypertension appears to be under better control.  Diabetes controlled  Secondary hyperparathyroidism PTH 324 started Rocaltrol 0.5 mg with hemodialysis  Hyperlipidemia continues on atorvastatin  Anemia transfused and is patient is receiving ESA.  Obstructive sleep apnea using CPAP   LOS: 7 Sherril Croon @TODAY @11 :00 AM

## 2018-05-18 ENCOUNTER — Encounter (HOSPITAL_COMMUNITY)
Admission: EM | Disposition: A | Discharge status: Home / Self Care | Payer: Self-pay | Source: Home / Self Care | Attending: Internal Medicine

## 2018-05-18 ENCOUNTER — Inpatient Hospital Stay (HOSPITAL_COMMUNITY): Payer: 59 | Admitting: Certified Registered Nurse Anesthetist

## 2018-05-18 ENCOUNTER — Encounter (HOSPITAL_COMMUNITY): Payer: Self-pay

## 2018-05-18 HISTORY — PX: AV FISTULA PLACEMENT: SHX1204

## 2018-05-18 LAB — CBC
HCT: 27.7 % — ABNORMAL LOW (ref 39.0–52.0)
HCT: 28.7 % — ABNORMAL LOW (ref 39.0–52.0)
Hemoglobin: 8 g/dL — ABNORMAL LOW (ref 13.0–17.0)
Hemoglobin: 8.5 g/dL — ABNORMAL LOW (ref 13.0–17.0)
MCH: 24.9 pg — AB (ref 26.0–34.0)
MCH: 24.7 pg — ABNORMAL LOW (ref 26.0–34.0)
MCHC: 28.9 g/dL — ABNORMAL LOW (ref 30.0–36.0)
MCHC: 29.6 g/dL — ABNORMAL LOW (ref 30.0–36.0)
MCV: 86.3 fL (ref 80.0–100.0)
MCV: 83.4 fL (ref 80.0–100.0)
NRBC: 0 % (ref 0.0–0.2)
Platelets: 214 10*3/uL (ref 150–400)
Platelets: 230 10*3/uL (ref 150–400)
RBC: 3.21 MIL/uL — ABNORMAL LOW (ref 4.22–5.81)
RBC: 3.44 MIL/uL — ABNORMAL LOW (ref 4.22–5.81)
RDW: 14.3 % (ref 11.5–15.5)
RDW: 14.4 % (ref 11.5–15.5)
WBC: 8.4 10*3/uL (ref 4.0–10.5)
WBC: 14.1 10*3/uL — ABNORMAL HIGH (ref 4.0–10.5)
nRBC: 0 % (ref 0.0–0.2)

## 2018-05-18 LAB — RENAL FUNCTION PANEL
ALBUMIN: 2.8 g/dL — AB (ref 3.5–5.0)
Albumin: 2.6 g/dL — ABNORMAL LOW (ref 3.5–5.0)
Anion gap: 17 — ABNORMAL HIGH (ref 5–15)
Anion gap: 16 — ABNORMAL HIGH (ref 5–15)
BUN: 77 mg/dL — AB (ref 6–20)
BUN: 40 mg/dL — ABNORMAL HIGH (ref 6–20)
CO2: 23 mmol/L (ref 22–32)
CO2: 22 mmol/L (ref 22–32)
Calcium: 8.4 mg/dL — ABNORMAL LOW (ref 8.9–10.3)
Calcium: 8.6 mg/dL — ABNORMAL LOW (ref 8.9–10.3)
Chloride: 97 mmol/L — ABNORMAL LOW (ref 98–111)
Chloride: 97 mmol/L — ABNORMAL LOW (ref 98–111)
Creatinine, Ser: 13.2 mg/dL — ABNORMAL HIGH (ref 0.61–1.24)
Creatinine, Ser: 7.83 mg/dL — ABNORMAL HIGH (ref 0.61–1.24)
GFR calc Af Amer: 5 mL/min — ABNORMAL LOW (ref >60)
GFR calc Af Amer: 9 mL/min — ABNORMAL LOW (ref >60)
GFR calc non Af Amer: 4 mL/min — ABNORMAL LOW (ref >60)
GFR calc non Af Amer: 8 mL/min — ABNORMAL LOW (ref >60)
Glucose, Bld: 94 mg/dL (ref 70–99)
Glucose, Bld: 115 mg/dL — ABNORMAL HIGH (ref 70–99)
Phosphorus: 9.4 mg/dL — ABNORMAL HIGH (ref 2.5–4.6)
Phosphorus: 6.4 mg/dL — ABNORMAL HIGH (ref 2.5–4.6)
Potassium: 4.4 mmol/L (ref 3.5–5.1)
Potassium: 4.6 mmol/L (ref 3.5–5.1)
Sodium: 137 mmol/L (ref 135–145)
Sodium: 135 mmol/L (ref 135–145)

## 2018-05-18 LAB — TACROLIMUS LEVEL: Tacrolimus (FK506) - LabCorp: 4.5 ng/mL (ref 2.0–20.0)

## 2018-05-18 LAB — GLUCOSE, CAPILLARY
Glucose-Capillary: 91 mg/dL (ref 70–99)
Glucose-Capillary: 113 mg/dL — ABNORMAL HIGH (ref 70–99)
Glucose-Capillary: 114 mg/dL — ABNORMAL HIGH (ref 70–99)
Glucose-Capillary: 170 mg/dL — ABNORMAL HIGH (ref 70–99)

## 2018-05-18 SURGERY — INSERTION OF ARTERIOVENOUS (AV) GORE-TEX GRAFT THIGH
Anesthesia: General | Site: Leg Upper | Laterality: Right

## 2018-05-18 MED ORDER — HEPARIN SODIUM (PORCINE) 1000 UNIT/ML IJ SOLN
INTRAMUSCULAR | Status: AC
  Filled 2018-05-18: qty 1

## 2018-05-18 MED ORDER — ROCURONIUM BROMIDE 50 MG/5ML IV SOSY
PREFILLED_SYRINGE | INTRAVENOUS | Status: AC
  Filled 2018-05-18: qty 10

## 2018-05-18 MED ORDER — FENTANYL CITRATE (PF) 250 MCG/5ML IJ SOLN
INTRAMUSCULAR | Status: AC
  Filled 2018-05-18: qty 5

## 2018-05-18 MED ORDER — CALCITRIOL 0.5 MCG PO CAPS
ORAL_CAPSULE | ORAL | Status: AC
  Administered 2018-05-18: 0.5 ug via ORAL
  Filled 2018-05-18: qty 1

## 2018-05-18 MED ORDER — PHENYLEPHRINE 40 MCG/ML (10ML) SYRINGE FOR IV PUSH (FOR BLOOD PRESSURE SUPPORT)
PREFILLED_SYRINGE | INTRAVENOUS | Status: AC
  Filled 2018-05-18: qty 10

## 2018-05-18 MED ORDER — DEXAMETHASONE SODIUM PHOSPHATE 10 MG/ML IJ SOLN
INTRAMUSCULAR | Status: DC | PRN
  Administered 2018-05-18: 8 mg via INTRAVENOUS

## 2018-05-18 MED ORDER — MORPHINE SULFATE (PF) 2 MG/ML IV SOLN: 2.0000 mg | INTRAVENOUS | Status: AC | PRN

## 2018-05-18 MED ORDER — PROTAMINE SULFATE 10 MG/ML IV SOLN
INTRAVENOUS | Status: DC | PRN
  Administered 2018-05-18: 20 mg via INTRAVENOUS

## 2018-05-18 MED ORDER — SODIUM CHLORIDE 0.9 % IV SOLN
INTRAVENOUS | Status: DC | PRN
  Administered 2018-05-18: 50 ug/min via INTRAVENOUS

## 2018-05-18 MED ORDER — HEPARIN SODIUM (PORCINE) 1000 UNIT/ML IJ SOLN
INTRAMUSCULAR | Status: AC
  Administered 2018-05-18: 1000 [IU] via INTRAVENOUS_CENTRAL
  Filled 2018-05-18: qty 6

## 2018-05-18 MED ORDER — DEXAMETHASONE SODIUM PHOSPHATE 10 MG/ML IJ SOLN
INTRAMUSCULAR | Status: AC
  Filled 2018-05-18: qty 1

## 2018-05-18 MED ORDER — LIDOCAINE HCL (PF) 1 % IJ SOLN: 5.0000 mL | INTRAMUSCULAR | Status: DC | PRN

## 2018-05-18 MED ORDER — LIDOCAINE-PRILOCAINE 2.5-2.5 % EX CREA: 1.0000 "application " | TOPICAL_CREAM | CUTANEOUS | Status: DC | PRN

## 2018-05-18 MED ORDER — PROPOFOL 10 MG/ML IV BOLUS
INTRAVENOUS | Status: AC
  Filled 2018-05-18: qty 20

## 2018-05-18 MED ORDER — PENTAFLUOROPROP-TETRAFLUOROETH EX AERO: 1.0000 "application " | INHALATION_SPRAY | CUTANEOUS | Status: DC | PRN

## 2018-05-18 MED ORDER — 0.9 % SODIUM CHLORIDE (POUR BTL) OPTIME
TOPICAL | Status: DC | PRN
  Administered 2018-05-18: 1000 mL

## 2018-05-18 MED ORDER — PROPOFOL 10 MG/ML IV BOLUS
INTRAVENOUS | Status: DC | PRN
  Administered 2018-05-18: 130 mg via INTRAVENOUS

## 2018-05-18 MED ORDER — ONDANSETRON HCL 4 MG/2ML IJ SOLN
INTRAMUSCULAR | Status: DC | PRN
  Administered 2018-05-18: 4 mg via INTRAVENOUS

## 2018-05-18 MED ORDER — DARBEPOETIN ALFA 100 MCG/0.5ML IJ SOSY
PREFILLED_SYRINGE | INTRAMUSCULAR | Status: AC
  Administered 2018-05-18: 100 ug via INTRAVENOUS
  Filled 2018-05-18: qty 0.5

## 2018-05-18 MED ORDER — HEPARIN SODIUM (PORCINE) 1000 UNIT/ML IJ SOLN
INTRAMUSCULAR | Status: DC | PRN
  Administered 2018-05-18: 5000 [IU] via INTRAVENOUS

## 2018-05-18 MED ORDER — CHLORHEXIDINE GLUCONATE CLOTH 2 % EX PADS
6.0000 | MEDICATED_PAD | Freq: Every day | CUTANEOUS | Status: DC
  Administered 2018-05-18 – 2018-05-21 (×4): 6 via TOPICAL

## 2018-05-18 MED ORDER — ONDANSETRON HCL 4 MG/2ML IJ SOLN
INTRAMUSCULAR | Status: AC
  Filled 2018-05-18: qty 2

## 2018-05-18 MED ORDER — LIDOCAINE HCL (PF) 1 % IJ SOLN
INTRAMUSCULAR | Status: AC
  Filled 2018-05-18: qty 30

## 2018-05-18 MED ORDER — HYDROCODONE-ACETAMINOPHEN 5-325 MG PO TABS: 1.0000 | ORAL_TABLET | ORAL | Status: DC | PRN

## 2018-05-18 MED ORDER — LIDOCAINE HCL (CARDIAC) PF 100 MG/5ML IV SOSY
PREFILLED_SYRINGE | INTRAVENOUS | Status: DC | PRN
  Administered 2018-05-18: 60 mg via INTRAVENOUS

## 2018-05-18 MED ORDER — PROTAMINE SULFATE 10 MG/ML IV SOLN
INTRAVENOUS | Status: AC
  Filled 2018-05-18: qty 5

## 2018-05-18 MED ORDER — MIDAZOLAM HCL 2 MG/2ML IJ SOLN
INTRAMUSCULAR | Status: AC
  Filled 2018-05-18: qty 2

## 2018-05-18 MED ORDER — LIDOCAINE 2% (20 MG/ML) 5 ML SYRINGE
INTRAMUSCULAR | Status: AC
  Filled 2018-05-18: qty 5

## 2018-05-18 MED ORDER — PHENYLEPHRINE 40 MCG/ML (10ML) SYRINGE FOR IV PUSH (FOR BLOOD PRESSURE SUPPORT)
PREFILLED_SYRINGE | INTRAVENOUS | Status: DC | PRN
  Administered 2018-05-18: 40 ug via INTRAVENOUS
  Administered 2018-05-18: 120 ug via INTRAVENOUS

## 2018-05-18 MED ORDER — SUCCINYLCHOLINE CHLORIDE 20 MG/ML IJ SOLN
INTRAMUSCULAR | Status: DC | PRN
  Administered 2018-05-18: 100 mg via INTRAVENOUS

## 2018-05-18 MED ORDER — HEMOSTATIC AGENTS (NO CHARGE) OPTIME
TOPICAL | Status: DC | PRN
  Administered 2018-05-18: 1 via TOPICAL

## 2018-05-18 MED ORDER — SODIUM CHLORIDE 0.9 % IV SOLN
INTRAVENOUS | Status: AC
  Filled 2018-05-18: qty 1.2

## 2018-05-18 MED ORDER — SODIUM CHLORIDE 0.9 % IV SOLN
INTRAVENOUS | Status: DC | PRN
  Administered 2018-05-18: 500 mL

## 2018-05-18 MED ORDER — HYDROCODONE-ACETAMINOPHEN 5-325 MG PO TABS: 1.0000 | ORAL_TABLET | Freq: Four times a day (QID) | ORAL | 0 refills | Status: DC | PRN

## 2018-05-18 MED ORDER — SODIUM CHLORIDE 0.9 % IV SOLN: 100.0000 mL | INTRAVENOUS | Status: DC | PRN

## 2018-05-18 MED ORDER — MIDAZOLAM HCL 2 MG/2ML IJ SOLN
INTRAMUSCULAR | Status: DC | PRN
  Administered 2018-05-18 (×2): 1 mg via INTRAVENOUS

## 2018-05-18 MED ORDER — FENTANYL CITRATE (PF) 250 MCG/5ML IJ SOLN
INTRAMUSCULAR | Status: DC | PRN
  Administered 2018-05-18: 50 ug via INTRAVENOUS
  Administered 2018-05-18: 100 ug via INTRAVENOUS
  Administered 2018-05-18 (×3): 50 ug via INTRAVENOUS

## 2018-05-18 MED ORDER — ALTEPLASE 2 MG IJ SOLR: 2.0000 mg | Freq: Once | INTRAMUSCULAR | Status: DC | PRN

## 2018-05-18 MED ORDER — SODIUM CHLORIDE 0.9 % IV SOLN
INTRAVENOUS | Status: DC
  Administered 2018-05-18 (×2): via INTRAVENOUS

## 2018-05-18 MED ORDER — HYDROMORPHONE HCL 1 MG/ML IJ SOLN: 0.2500 mg | INTRAMUSCULAR | Status: DC | PRN

## 2018-05-18 MED ORDER — HEPARIN SODIUM (PORCINE) 1000 UNIT/ML DIALYSIS
1000.0000 [IU] | INTRAMUSCULAR | Status: DC | PRN
  Administered 2018-05-18: 1000 [IU] via INTRAVENOUS_CENTRAL
  Filled 2018-05-18: qty 1

## 2018-05-18 SURGICAL SUPPLY — 58 items
ADH SKN CLS APL DERMABOND .7 (GAUZE/BANDAGES/DRESSINGS) ×2
BAG DECANTER FOR FLEXI CONT (MISCELLANEOUS) ×4 IMPLANT
BIOPATCH RED 1 DISK 7.0 (GAUZE/BANDAGES/DRESSINGS) ×3 IMPLANT
BIOPATCH RED 1IN DISK 7.0MM (GAUZE/BANDAGES/DRESSINGS) ×1
CANISTER SUCT 3000ML PPV (MISCELLANEOUS) ×4 IMPLANT
CATH PALINDROME RT-P 15FX19CM (CATHETERS) IMPLANT
CATH PALINDROME RT-P 15FX23CM (CATHETERS) IMPLANT
CATH PALINDROME RT-P 15FX28CM (CATHETERS) IMPLANT
CATH PALINDROME RT-P 15FX55CM (CATHETERS) IMPLANT
CLIP VESOCCLUDE MED 6/CT (CLIP) ×7 IMPLANT
CLIP VESOCCLUDE SM WIDE 6/CT (CLIP) ×7 IMPLANT
COVER PROBE W GEL 5X96 (DRAPES) ×4 IMPLANT
COVER SURGICAL LIGHT HANDLE (MISCELLANEOUS) ×4 IMPLANT
COVER WAND RF STERILE (DRAPES) ×4 IMPLANT
DERMABOND ADVANCED (GAUZE/BANDAGES/DRESSINGS) ×2
DERMABOND ADVANCED .7 DNX12 (GAUZE/BANDAGES/DRESSINGS) ×2 IMPLANT
DRAPE C-ARM 42X72 X-RAY (DRAPES) ×4 IMPLANT
DRAPE CHEST BREAST 15X10 FENES (DRAPES) ×4 IMPLANT
DRAPE INCISE IOBAN 66X45 STRL (DRAPES) ×4 IMPLANT
ELECT REM PT RETURN 9FT ADLT (ELECTROSURGICAL) ×4
ELECTRODE REM PT RTRN 9FT ADLT (ELECTROSURGICAL) ×2 IMPLANT
GAUZE 4X4 16PLY RFD (DISPOSABLE) ×4 IMPLANT
GLOVE BIO SURGEON STRL SZ7.5 (GLOVE) ×7 IMPLANT
GOWN STRL REUS W/ TWL LRG LVL3 (GOWN DISPOSABLE) ×4 IMPLANT
GOWN STRL REUS W/ TWL XL LVL3 (GOWN DISPOSABLE) ×3 IMPLANT
GOWN STRL REUS W/TWL LRG LVL3 (GOWN DISPOSABLE) ×8
GOWN STRL REUS W/TWL XL LVL3 (GOWN DISPOSABLE) ×8
GRAFT GORETEX STRT 4-7X45 (Vascular Products) ×3 IMPLANT
HEMOSTAT SNOW SURGICEL 2X4 (HEMOSTASIS) ×3 IMPLANT
INSERT FOGARTY SM (MISCELLANEOUS) ×4 IMPLANT
KIT BASIN OR (CUSTOM PROCEDURE TRAY) ×4 IMPLANT
KIT TURNOVER KIT B (KITS) ×4 IMPLANT
LOOP VESSEL MAXI BLUE (MISCELLANEOUS) ×3 IMPLANT
NDL 18GX1X1/2 (RX/OR ONLY) (NEEDLE) ×1 IMPLANT
NDL HYPO 25GX1X1/2 BEV (NEEDLE) ×1 IMPLANT
NEEDLE 18GX1X1/2 (RX/OR ONLY) (NEEDLE) ×4 IMPLANT
NEEDLE HYPO 25GX1X1/2 BEV (NEEDLE) ×4 IMPLANT
NS IRRIG 1000ML POUR BTL (IV SOLUTION) ×4 IMPLANT
PACK CV ACCESS (CUSTOM PROCEDURE TRAY) ×4 IMPLANT
PACK SURGICAL SETUP 50X90 (CUSTOM PROCEDURE TRAY) ×4 IMPLANT
PAD ARMBOARD 7.5X6 YLW CONV (MISCELLANEOUS) ×8 IMPLANT
SOAP 2 % CHG 4 OZ (WOUND CARE) ×4 IMPLANT
SUT ETHILON 3 0 PS 1 (SUTURE) ×4 IMPLANT
SUT MNCRL AB 4-0 PS2 18 (SUTURE) ×4 IMPLANT
SUT PROLENE 6 0 BV (SUTURE) ×5 IMPLANT
SUT VIC AB 2-0 CT1 27 (SUTURE) ×4
SUT VIC AB 2-0 CT1 TAPERPNT 27 (SUTURE) ×1 IMPLANT
SUT VIC AB 3-0 SH 27 (SUTURE) ×8
SUT VIC AB 3-0 SH 27X BRD (SUTURE) ×4 IMPLANT
SYR 10ML LL (SYRINGE) ×4 IMPLANT
SYR 20CC LL (SYRINGE) ×8 IMPLANT
SYR 5ML LL (SYRINGE) ×4 IMPLANT
SYR CONTROL 10ML LL (SYRINGE) ×4 IMPLANT
TOWEL GREEN STERILE (TOWEL DISPOSABLE) ×4 IMPLANT
TOWEL GREEN STERILE FF (TOWEL DISPOSABLE) ×4 IMPLANT
TOWEL OR 17X26 4PK STRL BLUE (TOWEL DISPOSABLE) ×4 IMPLANT
UNDERPAD 30X30 (UNDERPADS AND DIAPERS) ×4 IMPLANT
WATER STERILE IRR 1000ML POUR (IV SOLUTION) ×4 IMPLANT

## 2018-05-18 NOTE — Progress Notes (Signed)
PROGRESS NOTE    [name]  VOH:[id number] DOB: [birth year] DOA: 05/10/2018 PCP: Luetta Nutting, DO    Brief Narrative:   Patient is a 42 year old gentleman history of diabetes, hypertension, DD RT from [place] in 2014 after having been on HD short-term PD x7 years initially followed by [facility], history of obstructive sleep apnea, poorly controlled hypertension, morbid obesity who has had progressive worsening of renal function , presented to the ED with volume overload, 20 pounds weight gain, creatinine was noted to be 18.6, had temporary dialysis catheter in the right groin, started on hemodialysis, no improvement of renal function, anticipation for ESRD with low need of dialysis, vascular surgery following regarding permanent access,  status post venogram 05/13/2018, showing bilateral axillary vein occlusions, he went for left femoral AV loop graft 05/18/2018, patient with hypertensive urgency on presentation, difficult to control blood pressure initially, but has significantly improved after initiation of dialysis, .   Subjective: -Patient any chest pain, shortness of breath, no nausea or vomiting, reports tingling in the left fingers  Assessment & Plan:   Principal Problem:   Acute kidney injury superimposed on CKD ([place]) Active Problems:   OSA (obstructive sleep apnea)   Essential hypertension   Immunosuppression ([place])   Morbid obesity ([place])   Kidney replaced by transplant   Type 2 diabetes mellitus without complication, without long-term current use of insulin ([facility])   Hypertensive urgency   Acidosis   Volume overload   Anemia   Hypertensive emergency  Progressive CKD, transitioning to end-stage renal disease -Creatinine was 4.8 in September of this year,18 on admission, he has a transplant, appears to be failing, only on dialysis, management per renal -Patient with renal biopsy June of this year, inadequate tissue, per renal there is a strong suspicion  for secondary FSGS obesity with a UPC of 6 -Patient had temporary hemodialysis catheter in right groin per IR, vascular surgery following for permanent access, status post venogram 05/13/2018, showing bilateral axillary vein occlusions,he went for left femoral AV loop graft 05/18/2018, -Dialysis per renal   Renal transplant patient -Continue with immunosuppressive therapy  -Discussed with nephrology , there is no need to repeat biopsy  Hypertensive emergency/urgency -Blood pressure extremely difficult to control on admission, he is on multiple medication to achieve acceptable control, as well it continues to improve as he receives dialysis and volume status is improving . -Blood pressure is much better controlled after being started on hemodialysis, had to decrease some of his medications, so blood pressure is currently acceptable on current regimen of hydralazine 50 mg 3 times a day, clonidine, Coreg, on PRN hydralazine.  Amlodipine has been stopped  Anemia of chronic kidney disease -He was transfused 1 unit PRBC -Epogen per renal  Acidosis Secondary to failure.  Patient to have tunneled HD catheter placed today in anticipation of starting hemodialysis today.  Per nephrology.  Well controlled diabetes mellitus 2 Hemoglobin A1c at 5.8.  CBG of 86 this morning.  Continue to hold home oral hypoglycemic agents.  Continue sliding scale insulin.   Hyperlipidemia LDL elevated at 189   Continue home regimen statin.  Morbid obesity  Obstructive sleep apnea CPAP nightly.  He does report tingling at tip of left hand  digits, good capillary refills, good sensation, neurovascular intact, will monitor clinically   DVT prophylaxis: Heparin Code Status: Full Family Communication: Updated patient.  No family at bedside. Disposition Plan: Home when stable, awaiting permanent access, and clip   Consultants:   Nephrology  interventional radiology   Vascular surgery  Procedures:    Ultrasound of transplanted kidney 05/10/2018 Chest x-ray 05/10/2018  Right groin temporary dialysis catheter by IR 05/11/2018        Left femoral AV loop graft with 4-7 mm PTFE by Dr. Donzetta Matters 05/18/2018  Antimicrobials:   None      Objective: Vitals:   05/18/18 1330 05/18/18 1400 05/18/18 1430 05/18/18 1500  BP: (!) 122/57 136/74 (!) 113/52 128/67  Pulse: 72 73 75 74  Resp:      Temp:      TempSrc:      SpO2:      Weight:      Height:        Intake/Output Summary (Last 24 hours) at 05/18/2018 1506 Last data filed at 05/18/2018 1427 Gross per 24 hour  Intake 826 ml  Output 600 ml  Net 226 ml   Filed Weights   05/15/18 1138 05/18/18 0500 05/18/18 1300  Weight: (!) 151.8 kg (!) 154.7 kg (!) 155.3 kg    Examination:  Awake Alert, Oriented X 3, No new F.N deficits, Normal affect Symmetrical Chest wall movement, Good air movement bilaterally, CTAB RRR,No Gallops,Rubs or new Murmurs, No Parasternal Heave +ve B.Sounds, Abd Soft, No tenderness, No rebound - guarding or rigidity. No Cyanosis, Clubbing or edema, No new Rash or bruise ,right groin with no hematoma, bleed at catheter site       Data Reviewed: I have personally reviewed following labs and imaging studies  CBC: Recent Labs  Lab 05/12/18 1910 05/13/18 0754 05/14/18 0335 05/16/18 0423 05/18/18 0456  WBC 6.8 6.5 6.5 7.6 8.4  HGB 8.8* 8.6* 8.4* 8.4* 8.0*  HCT 27.5* 28.5* 27.4* 27.7* 27.7*  MCV 81.4 82.6 83.8 84.2 86.3  PLT 161 157 157 183 326   Basic Metabolic Panel: Recent Labs  Lab 05/12/18 0051 05/12/18 1910 05/13/18 0754 05/14/18 0335 05/16/18 0423 05/18/18 0456  NA 140 139 140 136 135 137  K 4.3 3.5 3.8 3.9 4.5 4.4  CL 107 101 103 99 97* 97*  CO2 15* 27 24 23 24 23   GLUCOSE 149* 148* 114* 98 102* 94  BUN 139* 56* 62* 42* 44* 77*  CREATININE 19.30* 10.58* 12.24* 10.08* 9.42* 13.20*  CALCIUM 7.6* 8.0* 8.2* 8.1* 8.7* 8.4*  PHOS 14.1* 7.4* 9.8*  --  7.6* 9.4*   GFR: Estimated  Creatinine Clearance: 11.4 mL/min (A) (by C-G formula based on SCr of 13.2 mg/dL (H)). Liver Function Tests: Recent Labs  Lab 05/12/18 0051 05/12/18 1910 05/13/18 0754 05/16/18 0423 05/18/18 0456  ALBUMIN 2.3* 2.4* 2.3* 2.5* 2.6*   No results for input(s): LIPASE, AMYLASE in the last 168 hours. No results for input(s): AMMONIA in the last 168 hours. Coagulation Profile: No results for input(s): INR, PROTIME in the last 168 hours. Cardiac Enzymes: No results for input(s): CKTOTAL, CKMB, CKMBINDEX, TROPONINI in the last 168 hours. BNP (last 3 results) No results for input(s): PROBNP in the last 8760 hours. HbA1C: No results for input(s): HGBA1C in the last 72 hours. CBG: Recent Labs  Lab 05/17/18 1206 05/17/18 1702 05/17/18 2125 05/18/18 0810 05/18/18 1215  GLUCAP 101* 104* 188* 91 113*   Lipid Profile: No results for input(s): CHOL, HDL, LDLCALC, TRIG, CHOLHDL, LDLDIRECT in the last 72 hours. Thyroid Function Tests: No results for input(s): TSH, T4TOTAL, FREET4, T3FREE, THYROIDAB in the last 72 hours. Anemia Panel: No results for input(s): VITAMINB12, FOLATE, FERRITIN, TIBC, IRON, RETICCTPCT in the last 72 hours. Sepsis Labs:  No results for input(s): PROCALCITON, LATICACIDVEN in the last 168 hours.  Recent Results (from the past 240 hour(s))  Urine Culture     Status: None   Collection Time: 05/10/18  6:14 PM  Result Value Ref Range Status   Specimen Description URINE, CLEAN CATCH  Final   Special Requests NONE  Final   Culture   Final    NO GROWTH Performed at [facility], [address] [place], [place] [address]    Report Status 05/11/2018 FINAL  Final  Culture, blood (Routine X 2) w Reflex to ID Panel     Status: None   Collection Time: 05/11/18 11:32 AM  Result Value Ref Range Status   Specimen Description BLOOD BLOOD LEFT FOREARM  Final   Special Requests   Final    BOTTLES DRAWN AEROBIC ONLY Blood Culture adequate volume   Culture   Final    NO  GROWTH 5 DAYS Performed at [facility], [place] [address] [place], [place] [address]    Report Status 05/16/2018 FINAL  Final  Culture, blood (Routine X 2) w Reflex to ID Panel     Status: None   Collection Time: 05/11/18  6:24 PM  Result Value Ref Range Status   Specimen Description BLOOD LEFT ANTECUBITAL  Final   Special Requests   Final    BOTTLES DRAWN AEROBIC ONLY Blood Culture results may not be optimal due to an inadequate volume of blood received in culture bottles   Culture   Final    NO GROWTH 5 DAYS Performed at [facility], [place] [address] [place], [place] [address]    Report Status 05/16/2018 FINAL  Final  MRSA PCR Screening     Status: None   Collection Time: 05/11/18  6:30 PM  Result Value Ref Range Status   MRSA by PCR NEGATIVE NEGATIVE Final    Comment:        The GeneXpert MRSA Assay (FDA approved for NASAL specimens only), is one component of a comprehensive MRSA colonization surveillance program. It is not intended to diagnose MRSA infection nor to guide or monitor treatment for MRSA infections. Performed at [facility], [place] [address], [place], [place] [address]          Radiology Studies: No results found.      Scheduled Meds: . sodium chloride   Intravenous Once  . aspirin EC  81 mg Oral Daily  . atorvastatin  20 mg Oral Daily  . calcitRIOL  0.5 mcg Oral Q T,Th,Sa-HD  . carvedilol  25 mg Oral BID  . chlorhexidine  15 mL Mouth Rinse BID  . Chlorhexidine Gluconate Cloth  6 each Topical Q0600  . Chlorhexidine Gluconate Cloth  6 each Topical Q0600  . cloNIDine  0.2 mg Oral TID  . darbepoetin (ARANESP) injection - DIALYSIS  100 mcg Intravenous Q Tue-HD  . heparin  5,000 Units Subcutaneous Q8H  . hydrALAZINE  50 mg Oral Q8H  . insulin aspart  0-9 Units Subcutaneous TID WC  . mouth rinse  15 mL Mouth Rinse q12n4p  . mycophenolate  1,000 mg Oral BID  . omega-3 acid ethyl esters  1 g Oral Daily  . predniSONE  5 mg Oral Q  breakfast  . sodium chloride flush  3 mL Intravenous Q12H  . sodium chloride flush  3 mL Intravenous Q12H  . tacrolimus  1.5 mg Oral BID   Continuous Infusions: . sodium chloride    .  sodium chloride 10 mL/hr at 05/18/18 0839  . sodium chloride    . sodium chloride       LOS: 8 days    Phillips Climes, MD [facility] Pager [phone] If 7PM-7AM, please contact night-coverage [email or web address] Password [email or web address] 05/18/2018, 3:06 PM

## 2018-05-18 NOTE — Discharge Instructions (Signed)
° °  [facility] ° °Discharge Instructions ° °AV Fistula or Graft Surgery for Dialysis Access ° °Please refer to the following instructions for your post-procedure care. Your surgeon or physician assistant will discuss any changes with you. ° °Activity ° °You may drive the day following your surgery, if you are comfortable and no longer taking prescription pain medication. Resume full activity as the soreness in your incision resolves. ° °Bathing/Showering ° °You may shower after you go home. Keep your incision dry for 48 hours. Do not soak in a bathtub, hot tub, or swim until the incision heals completely. You may not shower if you have a hemodialysis catheter. ° °Incision Care ° °Clean your incision with mild soap and water after 48 hours. Pat the area dry with a clean towel. You do not need a bandage unless otherwise instructed. Do not apply any ointments or creams to your incision. You may have skin glue on your incision. Do not peel it off. It will come off on its own in about one week. Your arm may swell a bit after surgery. To reduce swelling use pillows to elevate your arm so it is above your heart. Your doctor will tell you if you need to lightly wrap your arm with an ACE bandage. ° °Diet ° °Resume your normal diet. There are not special food restrictions following this procedure. In order to heal from your surgery, it is CRITICAL to get adequate nutrition. Your body requires vitamins, minerals, and protein. Vegetables are the best source of vitamins and minerals. Vegetables also provide the perfect balance of protein. Processed food has little nutritional value, so try to avoid this. ° °Medications ° °Resume taking all of your medications. If your incision is causing pain, you may take over-the counter pain relievers such as acetaminophen (Tylenol). If you were prescribed a stronger pain medication, please be aware these medications can cause nausea and constipation. Prevent  nausea by taking the medication with a snack or meal. Avoid constipation by drinking plenty of fluids and eating foods with high amount of fiber, such as fruits, vegetables, and grains. Do not take Tylenol if you are taking prescription pain medications. ° ° ° ° °Follow up °Your surgeon may want to see you in the office following your access surgery. If so, this will be arranged at the time of your surgery. ° °Please call us immediately for any of the following conditions: ° °Increased pain, redness, drainage (pus) from your incision site °Fever of 101 degrees or higher °Severe or worsening pain at your incision site °Hand pain or numbness. ° °Reduce your risk of vascular disease: ° °Stop smoking. If you would like help, call [facility] at 1-800-QUIT-NOW (1-800-784-8669) or [place] at 336-586-4000 ° °Manage your cholesterol °Maintain a desired weight °Control your diabetes °Keep your blood pressure down ° °Dialysis ° °It will take several weeks to several months for your new dialysis access to be ready for use. Your surgeon will determine when it is OK to use it. Your nephrologist will continue to direct your dialysis. You can continue to use your Permcath until your new access is ready for use. ° °If you have any questions, please call the office at [phone]. ° °

## 2018-05-18 NOTE — Progress Notes (Signed)
Pt. C/o numbness tingling in left fingertips and hand. Pt. Alert and oriented pt. States this is new and has had this feeling since being in recovery after his OR procedure left thigh AVG placement today. Dr. Waldron Labs paged and made aware no new orders pt. Notified.

## 2018-05-18 NOTE — Op Note (Signed)
Patient name: [name]. DERFLINGER MRN: [id number] DOB: 1975/09/19 Sex: male  05/18/2018 Pre-operative Diagnosis: End-stage renal disease Post-operative diagnosis:  Same Surgeon:  Erlene Quan C. Donzetta Matters, MD Assistant: Arlee Muslim, PA Procedure Performed: Left femoral AV loop graft with 4-7 mm PTFE  Indications: 42 year old male has history of bilateral upper extremity accesses as well as left femoral loop graft that of all failed.  He recently had right lower quadrant renal transplant and that has now failed.  He is currently receiving dialysis via a right femoral tunneled dialysis catheter.  He is indicated for consideration of catheter replacement and femoral AV loop graft.  Findings: The existing site of the tunneled dialysis catheter did have some inflammation and swelling.  The previous left femoral graft was occluded but there was femoral vein patent cephalad to the graft.  We elected to place a femoral loop graft where he previously had a failed graft.  There was strong inflow with no disease in the common femoral artery.  The femoral vein also had no existing disease in the previous graft was significantly inferior to the common femoral vein.  At completion of placing a femoral loop graft we had a strong thrill in the graft good signal in the runoff vein and a strong signal at the dorsalis pedis artery of the foot.   Procedure:  The patient was identified in the holding area and taken to the operating room where is placed supine the operating table and general anesthesia was induced.  He was sterilely prepped and draped in the bilateral groins given antibiotics and a timeout was called.  We initially used ultrasound to evaluate the left femoral vein but to the common femoral vein which was noted to be patent.  I was going to place a tunneled dialysis catheter here but given that he has a tunneled dialysis catheter on the right with some inflammation in the groin I elected to just replace his femoral  loop graft on the left.  We made a transverse incision at the site of his old incision.  On dissecting down we identified the arterial limb of the graft and dissected this out.  We dissected up onto the external iliac artery placed a vessel loop around this.  Side branch was injured and this was suture-ligated.  We dissected out the profunda femoris artery laterally as well as the SFA medially and placed Vesseloops around this.  We then dissected out the common femoral vein enough to get a side-biting clamp on it.  We made a counterincision and tunneled a 4-7 mm PTFE graft.  This was flushed with heparinized saline and clamped on the arterial limb which was 4 mm.  We then administered 5000 units of heparin.  The common femoral vein was clamped with a side-biting clamp and opened longitudinally where there was no disease.  I did flash it demonstrated good antegrade and retrograde bleeding and flushed with heparinized saline.  After re-clamping we then trimmed our graft to size and sewed end-to-side with 5-0 Prolene suture.  After completing this we then flushed through our graft clamped the graft through our counterincision.  We then dissected back on her existing PTFE graft on the common femoral artery.  I clamped the outflow followed by the inflow of the artery ultimately placed a side-biting clamp on the common femoral artery.  I dissected down to the previous site of anastomosis.  I removed an arterial plug out of the graft.  I did have good antegrade and retrograde  bleeding on flashing the side-biting clamp as well as the other clamps and then irrigated with heparinized saline.  I spatulated the 4 mm end of the graft and sewed end-to-side to the site of the previous anastomosis.  Prior to completion this time we flushed antegrade and retrograde through the graft and the artery.  I irrigated with heparinized saline completed the anastomosis.  There was strong thrill in the graft and a strong signal in the runoff  vein and there was a good signal at the dorsalis pedis artery that did not augment with compression of the graft.  Satisfied with this 25 mg of protamine was administered.  We obtained hemostasis irrigated the wound thoroughly and closed in layers with interrupted Vicryl and running 4-0 Monocryl both incision sites.  Dermabond was placed to level the skin.  He was then awakened from anesthesia having tolerated procedure without immediate complication.  All counts were correct at completion.  EBL 300 cc   Noeh Sparacino C. Donzetta Matters, MD [facility] Office: [phone] Pager: [phone]

## 2018-05-18 NOTE — Anesthesia Postprocedure Evaluation (Signed)
Anesthesia Post Note  Patient: [name]  Procedure(s) Performed: INSERTION OF ARTERIOVENOUS (AV) GORE-TEX GRAFT Left THIGH (Right Leg Upper)     Patient location during evaluation: PACU Anesthesia Type: General Level of consciousness: awake and alert Pain management: pain level controlled Vital Signs Assessment: post-procedure vital signs reviewed and stable Respiratory status: spontaneous breathing, nonlabored ventilation, respiratory function stable and patient connected to nasal cannula oxygen Cardiovascular status: blood pressure returned to baseline and stable Postop Assessment: no apparent nausea or vomiting Anesthetic complications: no    Last Vitals:  Vitals:   05/18/18 1254 05/18/18 1330  BP: 127/71 (!) 122/57  Pulse: 73 72  Resp: (!) 22   Temp:    SpO2: 100%     Last Pain:  Vitals:   05/18/18 1252  TempSrc:   PainSc: 0-No pain                 Alissa Pharr,W. EDMOND

## 2018-05-18 NOTE — Anesthesia Procedure Notes (Signed)
Procedure Name: Intubation Date/Time: 05/18/2018 9:15 AM Performed by: Raenette Rover, CRNA Pre-anesthesia Checklist: Patient identified, Emergency Drugs available, Suction available and Patient being monitored Patient Re-evaluated:Patient Re-evaluated prior to induction Oxygen Delivery Method: Circle system utilized Preoxygenation: Pre-oxygenation with 100% oxygen Induction Type: IV induction Ventilation: Mask ventilation without difficulty and Oral airway inserted - appropriate to patient size Laryngoscope Size: Mac and 4 Grade View: Grade I Tube type: Oral Tube size: 8.0 mm Number of attempts: 1 Airway Equipment and Method: Stylet Placement Confirmation: ETT inserted through vocal cords under direct vision,  positive ETCO2,  CO2 detector and breath sounds checked- equal and bilateral Secured at: 24 cm Tube secured with: Tape Dental Injury: Teeth and Oropharynx as per pre-operative assessment

## 2018-05-18 NOTE — Transfer of Care (Signed)
Immediate Anesthesia Transfer of Care Note  Patient: [name]  Procedure(s) Performed: INSERTION OF ARTERIOVENOUS (AV) GORE-TEX GRAFT Left THIGH (Right Leg Upper)  Patient Location: PACU  Anesthesia Type:General  Level of Consciousness: awake, drowsy and patient cooperative  Airway & Oxygen Therapy: Patient Spontanous Breathing and Patient connected to face mask oxygen  Post-op Assessment: Report given to RN and Post -op Vital signs reviewed and stable  Post vital signs: Reviewed and stable  Last Vitals:  Vitals Value Taken Time  BP 135/67 05/18/2018 12:09 PM  Temp    Pulse 78 05/18/2018 12:15 PM  Resp 21 05/18/2018 12:15 PM  SpO2 98 % 05/18/2018 12:15 PM  Vitals shown include unvalidated device data.  Last Pain:  Vitals:   05/18/18 0800  TempSrc:   PainSc: 0-No pain      Patients Stated Pain Goal: 0 (54/98/26 4158)  Complications: No apparent anesthesia complications

## 2018-05-18 NOTE — Progress Notes (Signed)
  Progress Note    05/18/2018 8:36 AM Day of Surgery  Subjective: No overnight issues  Vitals:   05/17/18 2129 05/18/18 0542  BP: (!) 132/92 (!) 145/84  Pulse: 83 86  Resp:    Temp: 98 F (36.7 C) 97.6 F (36.4 C)  SpO2: 100% 99%    Physical Exam: Awake alert oriented Moving all extremities well without limitation Palpable pedal pulses bilaterally  CBC    Component Value Date/Time   WBC 8.4 05/18/2018 0456   RBC 3.21 (L) 05/18/2018 0456   HGB 8.0 (L) 05/18/2018 0456   HCT 27.7 (L) 05/18/2018 0456   PLT 214 05/18/2018 0456   MCV 86.3 05/18/2018 0456   MCH 24.9 (L) 05/18/2018 0456   MCHC 28.9 (L) 05/18/2018 0456   RDW 14.3 05/18/2018 0456   LYMPHSABS 0.4 (L) 05/10/2018 1521   MONOABS 0.7 05/10/2018 1521   EOSABS 0.2 05/10/2018 1521   BASOSABS 0.0 05/10/2018 1521    BMET    Component Value Date/Time   NA 137 05/18/2018 0456   K 4.4 05/18/2018 0456   CL 97 (L) 05/18/2018 0456   CO2 23 05/18/2018 0456   GLUCOSE 94 05/18/2018 0456   BUN 77 (H) 05/18/2018 0456   CREATININE 13.20 (H) 05/18/2018 0456   CALCIUM 8.4 (L) 05/18/2018 0456   GFRNONAA 4 (L) 05/18/2018 0456   GFRAA 5 (L) 05/18/2018 0456    INR    Component Value Date/Time   INR 1.21 05/11/2018 0344     Intake/Output Summary (Last 24 hours) at 05/18/2018 0836 Last data filed at 05/18/2018 0800 Gross per 24 hour  Intake 246 ml  Output 300 ml  Net -54 ml     Assessment:  42 y.o. male is s/p tunneled dialysis catheter placement of right groin  Plan: OR today possible placement tunneled dialysis catheter in the left groin with right femoral graft versus right femoral quick stick graft only.   Brandon C. Donzetta Matters, MD [facility] Office: [phone] Pager: [phone]  05/18/2018 8:36 AM

## 2018-05-18 NOTE — Progress Notes (Signed)
[facility] ROUNDING NOTE   Subjective:   He has been doing well no complaints this morning.  Blood pressure 145/84 pulse 86 temperature 97.6 O2 sats 99% room air  Sodium 137 potassium 4.4 chloride 97 CO2 23 glucose 94 BUN 77 creatinine 13.2 calcium 8.4 phosphorus 9.4 albumin 2.6 WBC 8.4 hemoglobin 8.0 platelets 214  Objective:  Vital signs in last 24 hours:  Temp:  [97.6 F (36.4 C)-98 F (36.7 C)] 97.6 F (36.4 C) (12/10 0542) Pulse Rate:  [68-86] 86 (12/10 0542) BP: (132-150)/(84-92) 145/84 (12/10 0542) SpO2:  [90 %-100 %] 99 % (12/10 0542) Weight:  [154.7 kg] 154.7 kg (12/10 0500)  Weight change:  Filed Weights   05/15/18 0732 05/15/18 1138 05/18/18 0500  Weight: (!) 155 kg (!) 151.8 kg (!) 154.7 kg    Intake/Output: I/O last 3 completed shifts: In: 396 [P.O.:390; I.V.:6] Out: 300 [Urine:300]   Intake/Output this shift:  No intake/output data recorded.  CVS- RRR RS- CTA ABD- BS present soft non-distended EXT- no edema   right femoral hemodialysis catheter trace lower extremity edema   Basic Metabolic Panel: Recent Labs  Lab 05/12/18 0051 05/12/18 1910 05/13/18 0754 05/14/18 0335 05/16/18 0423 05/18/18 0456  NA 140 139 140 136 135 137  K 4.3 3.5 3.8 3.9 4.5 4.4  CL 107 101 103 99 97* 97*  CO2 15* 27 24 23 24 23   GLUCOSE 149* 148* 114* 98 102* 94  BUN 139* 56* 62* 42* 44* 77*  CREATININE 19.30* 10.58* 12.24* 10.08* 9.42* 13.20*  CALCIUM 7.6* 8.0* 8.2* 8.1* 8.7* 8.4*  PHOS 14.1* 7.4* 9.8*  --  7.6* 9.4*    Liver Function Tests: Recent Labs  Lab 05/12/18 0051 05/12/18 1910 05/13/18 0754 05/16/18 0423 05/18/18 0456  ALBUMIN 2.3* 2.4* 2.3* 2.5* 2.6*   No results for input(s): LIPASE, AMYLASE in the last 168 hours. No results for input(s): AMMONIA in the last 168 hours.  CBC: Recent Labs  Lab 05/12/18 1910 05/13/18 0754 05/14/18 0335 05/16/18 0423 05/18/18 0456  WBC 6.8 6.5 6.5 7.6 8.4  HGB 8.8* 8.6* 8.4* 8.4* 8.0*  HCT  27.5* 28.5* 27.4* 27.7* 27.7*  MCV 81.4 82.6 83.8 84.2 86.3  PLT 161 157 157 183 214    Cardiac Enzymes: No results for input(s): CKTOTAL, CKMB, CKMBINDEX, TROPONINI in the last 168 hours.  BNP: Invalid input(s): POCBNP  CBG: Recent Labs  Lab 05/17/18 0813 05/17/18 1206 05/17/18 1702 05/17/18 2125 05/18/18 0810  GLUCAP 79 101* 104* 188* 91    Microbiology: Results for orders placed or performed during the hospital encounter of 05/10/18  Urine Culture     Status: None   Collection Time: 05/10/18  6:14 PM  Result Value Ref Range Status   Specimen Description URINE, CLEAN CATCH  Final   Special Requests NONE  Final   Culture   Final    NO GROWTH Performed at [facility], [place] [address] [place], [place] [address]    Report Status 05/11/2018 FINAL  Final  Culture, blood (Routine X 2) w Reflex to ID Panel     Status: None   Collection Time: 05/11/18 11:32 AM  Result Value Ref Range Status   Specimen Description BLOOD BLOOD LEFT FOREARM  Final   Special Requests   Final    BOTTLES DRAWN AEROBIC ONLY Blood Culture adequate volume   Culture   Final    NO GROWTH 5 DAYS Performed at [facility], [place],  [name] [address]    Report Status 05/16/2018 FINAL  Final  Culture, blood (Routine X 2) w Reflex to ID Panel     Status: None   Collection Time: 05/11/18  6:24 PM  Result Value Ref Range Status   Specimen Description BLOOD LEFT ANTECUBITAL  Final   Special Requests   Final    BOTTLES DRAWN AEROBIC ONLY Blood Culture results may not be optimal due to an inadequate volume of blood received in culture bottles   Culture   Final    NO GROWTH 5 DAYS Performed at [facility], [place] [address], [place], [place] [address]    Report Status 05/16/2018 FINAL  Final  MRSA PCR Screening     Status: None   Collection Time: 05/11/18  6:30 PM  Result Value Ref Range Status   MRSA by PCR NEGATIVE NEGATIVE Final    Comment:        The GeneXpert MRSA  Assay (FDA approved for NASAL specimens only), is one component of a comprehensive MRSA colonization surveillance program. It is not intended to diagnose MRSA infection nor to guide or monitor treatment for MRSA infections. Performed at [facility], [place] [address] [place], [place] [address]     Coagulation Studies: No results for input(s): LABPROT, INR in the last 72 hours.  Urinalysis: No results for input(s): COLORURINE, LABSPEC, PHURINE, GLUCOSEU, HGBUR, BILIRUBINUR, KETONESUR, PROTEINUR, UROBILINOGEN, NITRITE, LEUKOCYTESUR in the last 72 hours.  Invalid input(s): APPERANCEUR    Imaging: Vas [place] Abi With/wo Tbi  Result Date: 05/16/2018 LOWER EXTREMITY DOPPLER STUDY Indications: Pre Operative for Thigh dialysis graft.  Performing Technologist: Birdena Crandall, [place] [facility]  Examination Guidelines: A complete evaluation includes at minimum, Doppler waveform signals and systolic blood pressure reading at the level of bilateral brachial, anterior tibial, and posterior tibial arteries, when vessel segments are accessible. Bilateral testing is considered an integral part of a complete examination. Photoelectric Plethysmograph (PPG) waveforms and toe systolic pressure readings are included as required and additional duplex testing as needed. Limited examinations for reoccurring indications may be performed as noted.  ABI Findings: +--------+------------------+-----+---------+--------+ Right   Rt Pressure (mmHg)IndexWaveform Comment  +--------+------------------+-----+---------+--------+ [id number]                    triphasic         +--------+------------------+-----+---------+--------+ PTA     181               1.18 triphasic         +--------+------------------+-----+---------+--------+ DP      152               0.99 triphasic         +--------+------------------+-----+---------+--------+ +--------+------------------+-----+---------+-------+ Left    Lt Pressure  (mmHg)IndexWaveform Comment +--------+------------------+-----+---------+-------+ [id number]                    triphasic        +--------+------------------+-----+---------+-------+ PTA     151               0.99 triphasic        +--------+------------------+-----+---------+-------+ DP      148               0.97 triphasic        +--------+------------------+-----+---------+-------+ +-------+-----------+-----------+------------+------------+ ABI/TBIToday's ABIToday's TBIPrevious ABIPrevious TBI +-------+-----------+-----------+------------+------------+ Right  1.18                                           +-------+-----------+-----------+------------+------------+  Left   0.99                                           +-------+-----------+-----------+------------+------------+  Summary: Right: Resting right ankle-brachial index is within normal range. No evidence of significant right lower extremity arterial disease. Left: Resting left ankle-brachial index is within normal range. No evidence of significant left lower extremity arterial disease.  *See table(s) above for measurements and observations.  Electronically signed by Harold Barban MD on 05/16/2018 at 2:45:17 PM.   Final      Medications:   . [MAR Hold] sodium chloride    . sodium chloride    . [MAR Hold]  ceFAZolin (ANCEF) IV     . [MAR Hold] sodium chloride   Intravenous Once  . [MAR Hold] aspirin EC  81 mg Oral Daily  . [MAR Hold] atorvastatin  20 mg Oral Daily  . [MAR Hold] calcitRIOL  0.5 mcg Oral Q T,Th,Sa-HD  . [MAR Hold] carvedilol  25 mg Oral BID  . [MAR Hold] chlorhexidine  15 mL Mouth Rinse BID  . [MAR Hold] Chlorhexidine Gluconate Cloth  6 each Topical Q0600  . [MAR Hold] cloNIDine  0.2 mg Oral TID  . [MAR Hold] darbepoetin (ARANESP) injection - DIALYSIS  100 mcg Intravenous Q Tue-HD  . [MAR Hold] heparin  5,000 Units Subcutaneous Q8H  . [MAR Hold] hydrALAZINE  50 mg Oral Q8H  . [MAR  Hold] insulin aspart  0-9 Units Subcutaneous TID WC  . [MAR Hold] mouth rinse  15 mL Mouth Rinse q12n4p  . [MAR Hold] mycophenolate  1,000 mg Oral BID  . [MAR Hold] omega-3 acid ethyl esters  1 g Oral Daily  . [MAR Hold] predniSONE  5 mg Oral Q breakfast  . [MAR Hold] sodium chloride flush  3 mL Intravenous Q12H  . [MAR Hold] sodium chloride flush  3 mL Intravenous Q12H  . [MAR Hold] tacrolimus  1.5 mg Oral BID   [MAR Hold] sodium chloride, [MAR Hold] acetaminophen **OR** [MAR Hold] acetaminophen, [MAR Hold] hydrALAZINE, [MAR Hold] ondansetron **OR** [MAR Hold] ondansetron (ZOFRAN) IV, [MAR Hold] polyethylene glycol, [MAR Hold] sodium chloride flush, [MAR Hold] sorbitol  Assessment/ Plan:   End-stage renal disease with progressive CKD transplant 2014 with probably secondary FSGS now with urine protein creatinine ratio up to 6 hemodialysis cath placed IR 05/11/2018 .  Appreciate assistance from Dr. Servando Snare, plan for AV loop graft right femoral and possible left femoral TDC.  Plan dialysis today 05/18/2018  DDRT with history of DGF at [facility] patient using 5 mg of prednisone daily MMF 1 g twice daily tacrolimus 1.5 twice daily, recent decreased dose 05/16/2018.  Has been being evaluated and seen at [facility] and not considered a candidate for repeat biopsy of transplant kidney  Hypertension appears to be under better control.  Diabetes controlled  Secondary hyperparathyroidism PTH 324 started Rocaltrol 0.5 mg with hemodialysis  Hyperlipidemia continues on atorvastatin  Anemia transfused and is patient is receiving ESA.  Obstructive sleep apnea using CPAP   LOS: 8 Sherril Croon @TODAY @8 :36 AM

## 2018-05-18 NOTE — Anesthesia Preprocedure Evaluation (Addendum)
Anesthesia Evaluation  Patient identified by MRN, date of birth, ID band Patient awake    Reviewed: Allergy & Precautions, H&P , NPO status , Patient's Chart, lab work & pertinent test results  Airway Mallampati: II  TM Distance: >3 FB Neck ROM: Full    Dental no notable dental hx. (+) Teeth Intact, Dental Advisory Given   Pulmonary sleep apnea and Continuous Positive Airway Pressure Ventilation ,    Pulmonary exam normal breath sounds clear to auscultation       Cardiovascular hypertension, Pt. on medications and Pt. on home beta blockers +CHF   Rhythm:Regular Rate:Normal     Neuro/Psych negative neurological ROS  negative psych ROS   GI/Hepatic Neg liver ROS, GERD  Controlled,  Endo/Other  diabetesHypothyroidism Morbid obesity  Renal/GU ESRF and DialysisRenal disease  negative genitourinary   Musculoskeletal   Abdominal   Peds  Hematology negative hematology ROS (+)   Anesthesia Other Findings   Reproductive/Obstetrics negative OB ROS                            Anesthesia Physical Anesthesia Plan  ASA: III  Anesthesia Plan: General   Post-op Pain Management:    Induction: Intravenous  PONV Risk Score and Plan: 3 and Ondansetron, Dexamethasone and Midazolam  Airway Management Planned: Oral ETT  Additional Equipment:   Intra-op Plan:   Post-operative Plan: Extubation in OR  Informed Consent: I have reviewed the patients History and Physical, chart, labs and discussed the procedure including the risks, benefits and alternatives for the proposed anesthesia with the patient or authorized representative who has indicated his/her understanding and acceptance.   Dental advisory given  Plan Discussed with: CRNA  Anesthesia Plan Comments:         Anesthesia Quick Evaluation

## 2018-05-19 ENCOUNTER — Encounter (HOSPITAL_COMMUNITY): Payer: Self-pay | Admitting: Vascular Surgery

## 2018-05-19 ENCOUNTER — Telehealth: Payer: Self-pay | Admitting: Vascular Surgery

## 2018-05-19 DIAGNOSIS — N186 End stage renal disease: Secondary | ICD-10-CM

## 2018-05-19 DIAGNOSIS — I10 Essential (primary) hypertension: Secondary | ICD-10-CM

## 2018-05-19 DIAGNOSIS — D631 Anemia in chronic kidney disease: Secondary | ICD-10-CM

## 2018-05-19 DIAGNOSIS — N184 Chronic kidney disease, stage 4 (severe): Secondary | ICD-10-CM

## 2018-05-19 DIAGNOSIS — G4733 Obstructive sleep apnea (adult) (pediatric): Secondary | ICD-10-CM

## 2018-05-19 LAB — RENAL FUNCTION PANEL
Albumin: 2.5 g/dL — ABNORMAL LOW (ref 3.5–5.0)
Anion gap: 13 (ref 5–15)
BUN: 51 mg/dL — ABNORMAL HIGH (ref 6–20)
CHLORIDE: 98 mmol/L (ref 98–111)
CO2: 25 mmol/L (ref 22–32)
Calcium: 8.8 mg/dL — ABNORMAL LOW (ref 8.9–10.3)
Creatinine, Ser: 9.44 mg/dL — ABNORMAL HIGH (ref 0.61–1.24)
GFR calc Af Amer: 7 mL/min — ABNORMAL LOW (ref >60)
GFR calc non Af Amer: 6 mL/min — ABNORMAL LOW (ref >60)
Glucose, Bld: 101 mg/dL — ABNORMAL HIGH (ref 70–99)
PHOSPHORUS: 8.7 mg/dL — AB (ref 2.5–4.6)
Potassium: 4.7 mmol/L (ref 3.5–5.1)
Sodium: 136 mmol/L (ref 135–145)

## 2018-05-19 LAB — CBC
HCT: 25.9 % — ABNORMAL LOW (ref 39.0–52.0)
Hemoglobin: 7.6 g/dL — ABNORMAL LOW (ref 13.0–17.0)
MCH: 24.9 pg — ABNORMAL LOW (ref 26.0–34.0)
MCHC: 29.3 g/dL — ABNORMAL LOW (ref 30.0–36.0)
MCV: 84.9 fL (ref 80.0–100.0)
NRBC: 0 % (ref 0.0–0.2)
PLATELETS: 238 10*3/uL (ref 150–400)
RBC: 3.05 MIL/uL — ABNORMAL LOW (ref 4.22–5.81)
RDW: 14.6 % (ref 11.5–15.5)
WBC: 10.4 10*3/uL (ref 4.0–10.5)

## 2018-05-19 LAB — GLUCOSE, CAPILLARY
GLUCOSE-CAPILLARY: 100 mg/dL — AB (ref 70–99)
Glucose-Capillary: 105 mg/dL — ABNORMAL HIGH (ref 70–99)
Glucose-Capillary: 132 mg/dL — ABNORMAL HIGH (ref 70–99)
Glucose-Capillary: 130 mg/dL — ABNORMAL HIGH (ref 70–99)

## 2018-05-19 MED ORDER — SUCROFERRIC OXYHYDROXIDE 500 MG PO CHEW
500.0000 mg | CHEWABLE_TABLET | Freq: Three times a day (TID) | ORAL | Status: DC
  Administered 2018-05-19 – 2018-05-21 (×5): 500 mg via ORAL
  Filled 2018-05-19 (×7): qty 1

## 2018-05-19 NOTE — Telephone Encounter (Signed)
-----   Message from Dagoberto Ligas, PA-C sent at 05/18/2018 12:29 PM EST -----  Can you schedule an appt for this pt in 2 weeks with Dr. Donzetta Matters or PA on Dr. Donzetta Matters office day for wound check.  PO L thigh AV graft. Thanks, [facility]

## 2018-05-19 NOTE — Progress Notes (Signed)
[facility] ROUNDING NOTE   Subjective:   Appears to be doing well no complaints.  AV graft revision left groin.  Incision site clean and dry.  Dialysis cath right groin  Blood pressure 1 25-68 pulse 84 temperature 98 O2 sats 99% room air  Sodium 136 potassium 4.7 chloride 98 CO2 25 BUN 51 creatinine 9.44 glucose 101 calcium 8.8 phosphorus 8.7 albumin 2.5 WBC 10.4 hemoglobin 7.6 platelets 238  Objective:  Vital signs in last 24 hours:  Temp:  [97.2 F (36.2 C)-99.1 F (37.3 C)] 98 F (36.7 C) (12/11 0606) Pulse Rate:  [60-86] 84 (12/11 0606) Resp:  [18-23] 20 (12/11 0606) BP: (113-142)/(52-75) 125/68 (12/11 0606) SpO2:  [96 %-100 %] 99 % (12/11 0606) Weight:  [151.5 kg-155.3 kg] 151.5 kg (12/10 1706)  Weight change: 0.6 kg Filed Weights   05/18/18 0500 05/18/18 1300 05/18/18 1706  Weight: (!) 154.7 kg (!) 155.3 kg (!) 151.5 kg    Intake/Output: I/O last 3 completed shifts: In: 28 [P.O.:290; I.V.:536] Out: 3600 [Urine:300; Other:3000; Blood:300]   Intake/Output this shift:  No intake/output data recorded.  CVS- RRR RS- CTA ABD- BS present soft non-distended EXT- no edema   right femoral hemodialysis catheter trace lower extremity edema   Basic Metabolic Panel: Recent Labs  Lab 05/13/18 0754 05/14/18 0335 05/16/18 0423 05/18/18 0456 05/18/18 1915 05/19/18 0524  NA 140 136 135 137 135 136  K 3.8 3.9 4.5 4.4 4.6 4.7  CL 103 99 97* 97* 97* 98  CO2 24 23 24 23 22 25   GLUCOSE 114* 98 102* 94 115* 101*  BUN 62* 42* 44* 77* 40* 51*  CREATININE 12.24* 10.08* 9.42* 13.20* 7.83* 9.44*  CALCIUM 8.2* 8.1* 8.7* 8.4* 8.6* 8.8*  PHOS 9.8*  --  7.6* 9.4* 6.4* 8.7*    Liver Function Tests: Recent Labs  Lab 05/13/18 0754 05/16/18 0423 05/18/18 0456 05/18/18 1915 05/19/18 0524  ALBUMIN 2.3* 2.5* 2.6* 2.8* 2.5*   No results for input(s): LIPASE, AMYLASE in the last 168 hours. No results for input(s): AMMONIA in the last 168 hours.  CBC: Recent  Labs  Lab 05/14/18 0335 05/16/18 0423 05/18/18 0456 05/18/18 1915 05/19/18 0524  WBC 6.5 7.6 8.4 14.1* 10.4  HGB 8.4* 8.4* 8.0* 8.5* 7.6*  HCT 27.4* 27.7* 27.7* 28.7* 25.9*  MCV 83.8 84.2 86.3 83.4 84.9  PLT 157 183 214 230 238    Cardiac Enzymes: No results for input(s): CKTOTAL, CKMB, CKMBINDEX, TROPONINI in the last 168 hours.  BNP: Invalid input(s): POCBNP  CBG: Recent Labs  Lab 05/18/18 0810 05/18/18 1215 05/18/18 1630 05/18/18 2112 05/19/18 0750  GLUCAP 91 113* 114* 170* 105*    Microbiology: Results for orders placed or performed during the hospital encounter of 05/10/18  Urine Culture     Status: None   Collection Time: 05/10/18  6:14 PM  Result Value Ref Range Status   Specimen Description URINE, CLEAN CATCH  Final   Special Requests NONE  Final   Culture   Final    NO GROWTH Performed at [facility], [place] [address] [place], [place] [address]    Report Status 05/11/2018 FINAL  Final  Culture, blood (Routine X 2) w Reflex to ID Panel     Status: None   Collection Time: 05/11/18 11:32 AM  Result Value Ref Range Status   Specimen Description BLOOD BLOOD LEFT FOREARM  Final   Special Requests   Final    BOTTLES DRAWN AEROBIC ONLY Blood Culture  adequate volume   Culture   Final    NO GROWTH 5 DAYS Performed at [facility], [place] [address] [place], [place] [address]    Report Status 05/16/2018 FINAL  Final  Culture, blood (Routine X 2) w Reflex to ID Panel     Status: None   Collection Time: 05/11/18  6:24 PM  Result Value Ref Range Status   Specimen Description BLOOD LEFT ANTECUBITAL  Final   Special Requests   Final    BOTTLES DRAWN AEROBIC ONLY Blood Culture results may not be optimal due to an inadequate volume of blood received in culture bottles   Culture   Final    NO GROWTH 5 DAYS Performed at [facility], [place] [address] [place], [place] [address]    Report Status 05/16/2018 FINAL  Final  MRSA PCR Screening     Status:  None   Collection Time: 05/11/18  6:30 PM  Result Value Ref Range Status   MRSA by PCR NEGATIVE NEGATIVE Final    Comment:        The GeneXpert MRSA Assay (FDA approved for NASAL specimens only), is one component of a comprehensive MRSA colonization surveillance program. It is not intended to diagnose MRSA infection nor to guide or monitor treatment for MRSA infections. Performed at [facility], [place] [address], [place],  [address]     Coagulation Studies: No results for input(s): LABPROT, INR in the last 72 hours.  Urinalysis: No results for input(s): COLORURINE, LABSPEC, PHURINE, GLUCOSEU, HGBUR, BILIRUBINUR, KETONESUR, PROTEINUR, UROBILINOGEN, NITRITE, LEUKOCYTESUR in the last 72 hours.  Invalid input(s): APPERANCEUR    Imaging: No results found.   Medications:   . sodium chloride Stopped (05/18/18 2208)  . sodium chloride Stopped (05/18/18 2208)   . sodium chloride   Intravenous Once  . aspirin EC  81 mg Oral Daily  . atorvastatin  20 mg Oral Daily  . calcitRIOL  0.5 mcg Oral Q T,Th,Sa-HD  . carvedilol  25 mg Oral BID  . chlorhexidine  15 mL Mouth Rinse BID  . Chlorhexidine Gluconate Cloth  6 each Topical Q0600  . Chlorhexidine Gluconate Cloth  6 each Topical Q0600  . cloNIDine  0.2 mg Oral TID  . darbepoetin (ARANESP) injection - DIALYSIS  100 mcg Intravenous Q Tue-HD  . heparin  5,000 Units Subcutaneous Q8H  . hydrALAZINE  50 mg Oral Q8H  . insulin aspart  0-9 Units Subcutaneous TID WC  . mouth rinse  15 mL Mouth Rinse q12n4p  . mycophenolate  1,000 mg Oral BID  . omega-3 acid ethyl esters  1 g Oral Daily  . predniSONE  5 mg Oral Q breakfast  . sodium chloride flush  3 mL Intravenous Q12H  . sodium chloride flush  3 mL Intravenous Q12H  . tacrolimus  1.5 mg Oral BID   sodium chloride, acetaminophen **OR** acetaminophen, hydrALAZINE, HYDROcodone-acetaminophen, morphine injection, ondansetron **OR** ondansetron (ZOFRAN) IV, polyethylene  glycol, sodium chloride flush, sorbitol  Assessment/ Plan:   End-stage renal disease with progressive CKD transplant 2014 with probably secondary FSGS now with urine protein creatinine ratio up to 6 hemodialysis cath placed IR 05/11/2018 .  Appreciate assistance from Dr. Servando Snare, plan for AV loop graft right femoral and possible left femoral TDC.  Plan dialysis today 05/20/2018.  Clip process in place  DDRT with history of DGF at [facility] patient using 5 mg of prednisone daily MMF 1 g twice daily tacrolimus 1.5 twice daily, recent  decreased dose 05/16/2018.  Has been being evaluated and seen at [facility] and not considered a candidate for repeat biopsy of transplant kidney  Hypertension appears to be under better control.  Diabetes controlled  Secondary hyperparathyroidism PTH 324 started Rocaltrol 0.5 mg with hemodialysis    hyperphosphatemia will start Velphoro 500 mg with meals  Hyperlipidemia continues on atorvastatin  Anemia transfused 05/12/2018   darbepoetin.  100 mcg administered 05/18/2018  Obstructive sleep apnea using CPAP  Stable from nephrology standpoint for discharge once dialysis unit ascribed   LOS: [place] @TODAY @9 :54 AM

## 2018-05-19 NOTE — Progress Notes (Signed)
PROGRESS NOTE        PATIENT DETAILS Name: [name] Age: 42 y.o. Sex: male Date of Birth: 1976/04/25 Admit Date: 05/10/2018 Admitting Physician Eugenie Filler, MD [id number], Einar Pheasant, DO  Brief Narrative: Patient is a 42 y.o. male with history of renal transplant on immunosuppressive's-presenting with worsening renal function and significant volume overload-now thought to have ESRD and started on hemodialysis.  See below for further details  Subjective: Lying comfortably in bed-denies any chest pain or shortness of breath.  Awaiting outpatient HD chair.   Assessment/Plan AKI on CKD stage IV-now ESRD: Nephrology following and directing HD care-clip process ongoing for outpatient HD chair.  Per nephrology-not considered a candidate for repeat biopsy of transplant kidney.  Continue Prograf, prednisone and CellCept.  Vascular surgery placed left AV graft thigh on 12/10.  Volume overload due to worsening renal function: Much improved after HD-appears euvolemic.  Hypertension: Controlled-continue hydralazine, clonidine, and Coreg.  Follow and adjust accordingly.  Anemia: Thought to be secondary to worsening renal function-has been transfused 1 unit of PRBC-darbepoetin and IV iron per nephrology.  Dyslipidemia: Continue statin  DM-2: CBG stable with SSI-does not appear to be on oral hypoglycemic agents in the outpatient setting most recent A1c is 5.8.  OSA: CPAP nightly  Morbid obesity  DVT Prophylaxis: Prophylactic Heparin   Code Status: Full code   Family Communication: None at bedside  Disposition Plan: Remain inpatient-awaiting outpatient HD chair before discharge  Antimicrobial agents: Anti-infectives (From admission, onward)   Start     Dose/Rate Route Frequency Ordered Stop   05/18/18 0600  ceFAZolin (ANCEF) 3 g in dextrose 5 % 50 mL IVPB     3 g 100 mL/hr over 30 Minutes Intravenous On call to O.R. 05/17/18 0809 05/18/18 1939   05/11/18 1400  ceFAZolin (ANCEF) IVPB 1 g/50 mL premix     1 g 100 mL/hr over 30 Minutes Intravenous To ShortStay Surgical 05/11/18 1119 05/11/18 1604   05/11/18 1130  ceFAZolin (ANCEF) IVPB 1 g/50 mL premix  Status:  Discontinued     1 g 100 mL/hr over 30 Minutes Intravenous  Once 05/11/18 1115 05/11/18 1119      Procedures: 12/10>> Left femoral AV loop graft with 4-7 mm PTFE  CONSULTS:  nephrology and vascular surgery  Time spent: 25- minutes-Greater than 50% of this time was spent in counseling, explanation of diagnosis, planning of further management, and coordination of care.  MEDICATIONS: Scheduled Meds: . sodium chloride   Intravenous Once  . aspirin EC  81 mg Oral Daily  . atorvastatin  20 mg Oral Daily  . calcitRIOL  0.5 mcg Oral Q T,Th,Sa-HD  . carvedilol  25 mg Oral BID  . chlorhexidine  15 mL Mouth Rinse BID  . Chlorhexidine Gluconate Cloth  6 each Topical Q0600  . Chlorhexidine Gluconate Cloth  6 each Topical Q0600  . cloNIDine  0.2 mg Oral TID  . darbepoetin (ARANESP) injection - DIALYSIS  100 mcg Intravenous Q Tue-HD  . heparin  5,000 Units Subcutaneous Q8H  . hydrALAZINE  50 mg Oral Q8H  . insulin aspart  0-9 Units Subcutaneous TID WC  . mouth rinse  15 mL Mouth Rinse q12n4p  . mycophenolate  1,000 mg Oral BID  . omega-3 acid ethyl esters  1 g Oral Daily  . predniSONE  5 mg Oral Q breakfast  .  sodium chloride flush  3 mL Intravenous Q12H  . sodium chloride flush  3 mL Intravenous Q12H  . sucroferric oxyhydroxide  500 mg Oral TID WC  . tacrolimus  1.5 mg Oral BID   Continuous Infusions: . sodium chloride Stopped (05/18/18 2208)  . sodium chloride Stopped (05/18/18 2208)   PRN Meds:.sodium chloride, acetaminophen **OR** acetaminophen, hydrALAZINE, HYDROcodone-acetaminophen, morphine injection, ondansetron **OR** ondansetron (ZOFRAN) IV, polyethylene glycol, sodium chloride flush, sorbitol   PHYSICAL EXAM: Vital signs: Vitals:   05/18/18 1706 05/18/18  2115 05/19/18 0606 05/19/18 1342  BP: (!) 142/75 123/62 125/68 106/62  Pulse: 76 86 84   Resp: '18 19 20   ' Temp: (!) 97.5 F (36.4 C) 99.1 F (37.3 C) 98 F (36.7 C) 97.9 F (36.6 C)  TempSrc: Oral Oral Oral Oral  SpO2: 100% 97% 99%   Weight: (!) 151.5 kg     Height:       Filed Weights   05/18/18 0500 05/18/18 1300 05/18/18 1706  Weight: (!) 154.7 kg (!) 155.3 kg (!) 151.5 kg   Body mass index is 42.88 kg/m.   General appearance :Awake, alert, not in any distress. Speech Clear.  HEENT: Atraumatic and Normocephalic Resp:Good air entry bilaterally, no added sounds  CVS: S1 S2 regular GI: Bowel sounds present, Non tender and not distended with no gaurding, rigidity or rebound.No organomegaly Extremities: B/L Lower Ext shows no edema, both legs are warm to touch Neurology:  speech clear,Non focal, sensation is grossly intact. Musculoskeletal:No digital cyanosis Skin:No Rash, warm and dry Wounds:N/A  I have personally reviewed following labs and imaging studies  LABORATORY DATA: CBC: Recent Labs  Lab 05/14/18 0335 05/16/18 0423 05/18/18 0456 05/18/18 1915 05/19/18 0524  WBC 6.5 7.6 8.4 14.1* 10.4  HGB 8.4* 8.4* 8.0* 8.5* 7.6*  HCT 27.4* 27.7* 27.7* 28.7* 25.9*  MCV 83.8 84.2 86.3 83.4 84.9  PLT 157 183 214 230 102    Basic Metabolic Panel: Recent Labs  Lab 05/13/18 0754 05/14/18 0335 05/16/18 0423 05/18/18 0456 05/18/18 1915 05/19/18 0524  NA 140 136 135 137 135 136  K 3.8 3.9 4.5 4.4 4.6 4.7  CL 103 99 97* 97* 97* 98  CO2 '24 23 24 23 22 25  ' GLUCOSE 114* 98 102* 94 115* 101*  BUN 62* 42* 44* 77* 40* 51*  CREATININE 12.24* 10.08* 9.42* 13.20* 7.83* 9.44*  CALCIUM 8.2* 8.1* 8.7* 8.4* 8.6* 8.8*  PHOS 9.8*  --  7.6* 9.4* 6.4* 8.7*    GFR: Estimated Creatinine Clearance: 15.7 mL/min (A) (by C-G formula based on SCr of 9.44 mg/dL (H)).  Liver Function Tests: Recent Labs  Lab 05/13/18 0754 05/16/18 0423 05/18/18 0456 05/18/18 1915 05/19/18 0524    ALBUMIN 2.3* 2.5* 2.6* 2.8* 2.5*   No results for input(s): LIPASE, AMYLASE in the last 168 hours. No results for input(s): AMMONIA in the last 168 hours.  Coagulation Profile: No results for input(s): INR, PROTIME in the last 168 hours.  Cardiac Enzymes: No results for input(s): CKTOTAL, CKMB, CKMBINDEX, TROPONINI in the last 168 hours.  BNP (last 3 results) No results for input(s): PROBNP in the last 8760 hours.  HbA1C: No results for input(s): HGBA1C in the last 72 hours.  CBG: Recent Labs  Lab 05/18/18 1215 05/18/18 1630 05/18/18 2112 05/19/18 0750 05/19/18 1234  GLUCAP 113* 114* 170* 105* 100*    Lipid Profile: No results for input(s): CHOL, HDL, LDLCALC, TRIG, CHOLHDL, LDLDIRECT in the last 72 hours.  Thyroid Function Tests: No results  for input(s): TSH, T4TOTAL, FREET4, T3FREE, THYROIDAB in the last 72 hours.  Anemia Panel: No results for input(s): VITAMINB12, FOLATE, FERRITIN, TIBC, IRON, RETICCTPCT in the last 72 hours.  Urine analysis:    Component Value Date/Time   COLORURINE STRAW (A) 05/10/2018 1811   APPEARANCEUR CLEAR 05/10/2018 1811   LABSPEC 1.012 05/10/2018 1811   PHURINE 7.0 05/10/2018 1811   GLUCOSEU 150 (A) 05/10/2018 1811   HGBUR SMALL (A) 05/10/2018 1811   BILIRUBINUR NEGATIVE 05/10/2018 1811   BILIRUBINUR negative 01/25/2018 0932   KETONESUR NEGATIVE 05/10/2018 1811   PROTEINUR >=300 (A) 05/10/2018 1811   UROBILINOGEN 0.2 01/25/2018 0932   NITRITE NEGATIVE 05/10/2018 1811   LEUKOCYTESUR TRACE (A) 05/10/2018 1811    Sepsis Labs: Lactic Acid, Venous No results found for: LATICACIDVEN  MICROBIOLOGY: Recent Results (from the past 240 hour(s))  Urine Culture     Status: None   Collection Time: 05/10/18  6:14 PM  Result Value Ref Range Status   Specimen Description URINE, CLEAN CATCH  Final   Special Requests NONE  Final   Culture   Final    NO GROWTH Performed at [facility], [place] [address], [place], [place] [address]     Report Status 05/11/2018 FINAL  Final  Culture, blood (Routine X 2) w Reflex to ID Panel     Status: None   Collection Time: 05/11/18 11:32 AM  Result Value Ref Range Status   Specimen Description BLOOD BLOOD LEFT FOREARM  Final   Special Requests   Final    BOTTLES DRAWN AEROBIC ONLY Blood Culture adequate volume   Culture   Final    NO GROWTH 5 DAYS Performed at [facility], [place] [employer] [address], [place], [place] [address]    Report Status 05/16/2018 FINAL  Final  Culture, blood (Routine X 2) w Reflex to ID Panel     Status: None   Collection Time: 05/11/18  6:24 PM  Result Value Ref Range Status   Specimen Description BLOOD LEFT ANTECUBITAL  Final   Special Requests   Final    BOTTLES DRAWN AEROBIC ONLY Blood Culture results may not be optimal due to an inadequate volume of blood received in culture bottles   Culture   Final    NO GROWTH 5 DAYS Performed at [facility], [place] [address], [place], [place] [address]    Report Status 05/16/2018 FINAL  Final  MRSA PCR Screening     Status: None   Collection Time: 05/11/18  6:30 PM  Result Value Ref Range Status   MRSA by PCR NEGATIVE NEGATIVE Final    Comment:        The GeneXpert MRSA Assay (FDA approved for NASAL specimens only), is one component of a comprehensive MRSA colonization surveillance program. It is not intended to diagnose MRSA infection nor to guide or monitor treatment for MRSA infections. Performed at [facility], [place] [address] [place], [place] [address]     RADIOLOGY STUDIES/RESULTS: Dg Chest 2 View  Result Date: 05/10/2018 CLINICAL DATA:  Generalized swelling. EXAM: CHEST - 2 VIEW COMPARISON:  December 13, 2010 FINDINGS: The heart size and mediastinal contours are within normal limits. Both lungs are clear. The visualized skeletal structures are unremarkable. IMPRESSION: No active cardiopulmonary disease. Electronically Signed   By: Dorise Bullion III M.D   On: 05/10/2018 16:16   US Renal  Transplant W/doppler  Result Date: 05/10/2018 CLINICAL DATA:  Acute renal failure.  Renal transplant.  EXAM: ULTRASOUND OF RENAL TRANSPLANT WITH RENAL DOPPLER ULTRASOUND TECHNIQUE: Ultrasound examination of the renal transplant was performed with gray-scale, color and duplex doppler evaluation. COMPARISON:  None. FINDINGS: Transplant kidney location: Right lower quadrant Transplant Kidney: Renal measurements: 12.6 x 4.8 x 3.8 cm = volume: 118.64m. Normal in size and parenchymal echogenicity. No evidence of mass or hydronephrosis. Color flow in the main renal artery:  Yes Color flow in the main renal vein:  Yes Duplex Doppler Evaluation: Main Renal Artery Resistive Index: 0.82 Venous waveform in main renal vein:  Present Intrarenal resistive index in upper pole:  0.79 (normal 0.6-0.8; equivocal 0.8-0.9; abnormal >= 0.9) Intrarenal resistive index in lower pole: 0.56 (normal 0.6-0.8; equivocal 0.8-0.9; abnormal >= 0.9) Bladder: Not visualized Other findings: There is a 7.1 x 5.6 x 4.8 complex fluid collection noted in the right lower quadrant adjacent to the lower pole of the transplant kidney. This was noted on [facility] when this measured up to 8.6 cm. IMPRESSION: Right lower quadrant renal transplant. No acute findings. No hydronephrosis. 7.1 cm complex fluid collection in the right lower quadrant adjacent to the lower pole of the transplant kidney. This has decreased in size when [facility] Electronically Signed   By: KRolm BaptiseM.D.   On: 05/10/2018 21:47   Ir Fluoro Guide Cv Line Right  Result Date: 05/11/2018 INDICATION: History of end-stage renal disease now with failed renal transplant. Patient with multiple previous tunneled dialysis catheters and concern for upper extremity central venous occlusion demonstrated on remote declot performed 02/17/2018 and as patient previously dialyzed via a right femoral  approach dialysis catheter prior to receiving his renal transplant. EXAM: TUNNELED CENTRAL VENOUS HEMODIALYSIS CATHETER PLACEMENT WITH ULTRASOUND AND FLUOROSCOPIC GUIDANCE MEDICATIONS: Ancef 1 gm IV . The antibiotic was given in an appropriate time interval prior to skin puncture. ANESTHESIA/SEDATION: Versed 1.5 mg IV; Fentanyl 100 mcg IV; Moderate Sedation Time:  30 minutes The patient was continuously monitored during the procedure by the interventional radiology nurse under my direct supervision. FLUOROSCOPY TIME:  2 minutes, 24 seconds (41 mGy) COMPLICATIONS: None immediate. PROCEDURE: Informed written consent was obtained from the patient after a discussion of the risks, benefits, and alternatives to treatment. Questions regarding the procedure were encouraged and answered. Given above, decision was made to proceed with a tunneled right common femoral approach dialysis catheter. Sonographic evaluation demonstrated persistent patency of the right common femoral vein. As such, the right groin and superolateral aspect the right thigh was prepped with chlorhexidine in a sterile fashion, and a sterile drape was applied covering the operative field. Maximum barrier sterile technique with sterile gowns and gloves were used for the procedure. A timeout was performed prior to the initiation of the procedure. After creating a small venotomy incision, a micropuncture kit was utilized to access the right common femoral vein. Real-time ultrasound guidance was utilized for vascular access including the acquisition of a permanent ultrasound image documenting patency of the accessed vessel. A stiff glidewire was utilized for measurement purposes Next, the micropuncture sheath was exchanged for a peel-away sheath under fluoroscopic guidance. A asked split tunneled hemodialysis catheter measuring 55 cm from tip to cuff was tunneled in a retrograde fashion from the anterolateral aspect the right thigh to the venotomy incision.  The catheter was then placed through the peel-away sheath with tips ultimately positioned within the perihepatic IVC. Given concern for slightly caudal positioning of the tips of the  dialysis catheter, the decision was made to create a shorter length subcutaneous track. Note, despite measurement, patient has a large amount of redundant tissue about the thigh which resulted in suboptimal catheter tip positioning. As such, a more proximal location on the patient's anterolateral aspect right thigh was selected and utilized to tunnel a new 55 cm dialysis catheter to the venotomy incision. Existing dialysis catheter was retracted from its access site at the venotomy site. The peripheral aspect of the dialysis catheter was cut, the peripheral component was removed. A stiff glidewire was utilized to cannulate the remainder of the remaining portion of the existing dialysis catheter and advanced to the level of the inferior cavoatrial junction. Next, the remaining portion of the initial dialysis catheter was exchanged for a new the low a sheath under fluoroscopic guidance. Finally, the new 55 cm tip to cuff dialysis catheter was inserted through the peel-away sheath with tip ultimately terminating within the inferior cavoatrial junction. Final catheter positioning was confirmed and documented with a spot fluoroscopic images. The catheter aspirates and flushes normally. The catheter was flushed with appropriate volume heparin dwells. The catheter exit site was secured with a 0-Prolene retention suture. The venotomy incision was closed with Dermabond and Steri-strips. Dressings were applied. The patient tolerated the procedure well without immediate post procedural complication. IMPRESSION: Successful placement of 55 cm tip to cuff tunneled hemodialysis catheter via the right common femoral vein vein with tips terminating within the inferior cavoatrial junction. The catheter is ready for immediate use. Electronically Signed    By: Sandi Mariscal M.D.   On: 05/11/2018 17:46   Ir US Guide Vasc Access Right  Result Date: 05/11/2018 INDICATION: History of end-stage renal disease now with failed renal transplant. Patient with multiple previous tunneled dialysis catheters and concern for upper extremity central venous occlusion demonstrated on remote declot performed 02/17/2018 and as patient previously dialyzed via a right femoral approach dialysis catheter prior to receiving his renal transplant. EXAM: TUNNELED CENTRAL VENOUS HEMODIALYSIS CATHETER PLACEMENT WITH ULTRASOUND AND FLUOROSCOPIC GUIDANCE MEDICATIONS: Ancef 1 gm IV . The antibiotic was given in an appropriate time interval prior to skin puncture. ANESTHESIA/SEDATION: Versed 1.5 mg IV; Fentanyl 100 mcg IV; Moderate Sedation Time:  30 minutes The patient was continuously monitored during the procedure by the interventional radiology nurse under my direct supervision. FLUOROSCOPY TIME:  2 minutes, 24 seconds (41 mGy) COMPLICATIONS: None immediate. PROCEDURE: Informed written consent was obtained from the patient after a discussion of the risks, benefits, and alternatives to treatment. Questions regarding the procedure were encouraged and answered. Given above, decision was made to proceed with a tunneled right common femoral approach dialysis catheter. Sonographic evaluation demonstrated persistent patency of the right common femoral vein. As such, the right groin and superolateral aspect the right thigh was prepped with chlorhexidine in a sterile fashion, and a sterile drape was applied covering the operative field. Maximum barrier sterile technique with sterile gowns and gloves were used for the procedure. A timeout was performed prior to the initiation of the procedure. After creating a small venotomy incision, a micropuncture kit was utilized to access the right common femoral vein. Real-time ultrasound guidance was utilized for vascular access including the acquisition of a  permanent ultrasound image documenting patency of the accessed vessel. A stiff glidewire was utilized for measurement purposes Next, the micropuncture sheath was exchanged for a peel-away sheath under fluoroscopic guidance. A asked split tunneled hemodialysis catheter measuring 55 cm from tip to cuff was tunneled in a retrograde  fashion from the anterolateral aspect the right thigh to the venotomy incision. The catheter was then placed through the peel-away sheath with tips ultimately positioned within the perihepatic IVC. Given concern for slightly caudal positioning of the tips of the dialysis catheter, the decision was made to create a shorter length subcutaneous track. Note, despite measurement, patient has a large amount of redundant tissue about the thigh which resulted in suboptimal catheter tip positioning. As such, a more proximal location on the patient's anterolateral aspect right thigh was selected and utilized to tunnel a new 55 cm dialysis catheter to the venotomy incision. Existing dialysis catheter was retracted from its access site at the venotomy site. The peripheral aspect of the dialysis catheter was cut, the peripheral component was removed. A stiff glidewire was utilized to cannulate the remainder of the remaining portion of the existing dialysis catheter and advanced to the level of the inferior cavoatrial junction. Next, the remaining portion of the initial dialysis catheter was exchanged for a new the low a sheath under fluoroscopic guidance. Finally, the new 55 cm tip to cuff dialysis catheter was inserted through the peel-away sheath with tip ultimately terminating within the inferior cavoatrial junction. Final catheter positioning was confirmed and documented with a spot fluoroscopic images. The catheter aspirates and flushes normally. The catheter was flushed with appropriate volume heparin dwells. The catheter exit site was secured with a 0-Prolene retention suture. The venotomy  incision was closed with Dermabond and Steri-strips. Dressings were applied. The patient tolerated the procedure well without immediate post procedural complication. IMPRESSION: Successful placement of 55 cm tip to cuff tunneled hemodialysis catheter via the right common femoral vein vein with tips terminating within the inferior cavoatrial junction. The catheter is ready for immediate use. Electronically Signed   By: Sandi Mariscal M.D.   On: 05/11/2018 17:46   Vas [place] Burnard Bunting With/wo Tbi  Result Date: 05/16/2018 LOWER EXTREMITY DOPPLER STUDY Indications: Pre Operative for Thigh dialysis graft.  Performing Technologist: Birdena Crandall, [place] RVS  Examination Guidelines: A complete evaluation includes at minimum, Doppler waveform signals and systolic blood pressure reading at the level of bilateral brachial, anterior tibial, and posterior tibial arteries, when vessel segments are accessible. Bilateral testing is considered an integral part of a complete examination. Photoelectric Plethysmograph (PPG) waveforms and toe systolic pressure readings are included as required and additional duplex testing as needed. Limited examinations for reoccurring indications may be performed as noted.  ABI Findings: +--------+------------------+-----+---------+--------+ Right   Rt Pressure (mmHg)IndexWaveform Comment  +--------+------------------+-----+---------+--------+ [id number]                    triphasic         +--------+------------------+-----+---------+--------+ PTA     181               1.18 triphasic         +--------+------------------+-----+---------+--------+ DP      152               0.99 triphasic         +--------+------------------+-----+---------+--------+ +--------+------------------+-----+---------+-------+ Left    Lt Pressure (mmHg)IndexWaveform Comment +--------+------------------+-----+---------+-------+ [id number]                    triphasic         +--------+------------------+-----+---------+-------+ PTA     151               0.99 triphasic        +--------+------------------+-----+---------+-------+ DP      148  0.97 triphasic        +--------+------------------+-----+---------+-------+ +-------+-----------+-----------+------------+------------+ ABI/TBIToday's ABIToday's TBIPrevious ABIPrevious TBI +-------+-----------+-----------+------------+------------+ Right  1.18                                           +-------+-----------+-----------+------------+------------+ Left   0.99                                           +-------+-----------+-----------+------------+------------+  Summary: Right: Resting right ankle-brachial index is within normal range. No evidence of significant right lower extremity arterial disease. Left: Resting left ankle-brachial index is within normal range. No evidence of significant left lower extremity arterial disease.  *See table(s) above for measurements and observations.  Electronically signed by Harold Barban MD on 05/16/2018 at 2:45:17 PM.   Final    Vas [place] Upper Ext Vein Mapping (pre-op Avf)  Result Date: 05/12/2018 UPPER EXTREMITY VEIN MAPPING  Indications: Pre-access. Limitations: patient body habitus, hypersomnolence Performing Technologist: Maudry Mayhew MHA, RDMS, RVT, RDCS  Examination Guidelines: A complete evaluation includes B-mode imaging, spectral Doppler, color Doppler, and power Doppler as needed of all accessible portions of each vessel. Bilateral testing is considered an integral part of a complete examination. Limited examinations for reoccurring indications may be performed as noted. +-----------------+-------------+----------+--------------+ Right Cephalic   Diameter (cm)Depth (cm)   Findings    +-----------------+-------------+----------+--------------+ Shoulder             0.24        1.46      thrombus     +-----------------+-------------+----------+--------------+ Prox upper arm       0.25        1.50      thrombus    +-----------------+-------------+----------+--------------+ Mid upper arm        0.37        1.17      thrombus    +-----------------+-------------+----------+--------------+ Dist upper arm       0.27        0.65      thrombus    +-----------------+-------------+----------+--------------+ Antecubital fossa                       not visualized +-----------------+-------------+----------+--------------+ Prox forearm                            not visualized +-----------------+-------------+----------+--------------+ Mid forearm                             not visualized +-----------------+-------------+----------+--------------+ Dist forearm                            not visualized +-----------------+-------------+----------+--------------+ Wrist                                   not visualized +-----------------+-------------+----------+--------------+ +-----------------+-------------+----------+--------------+ Right Basilic    Diameter (cm)Depth (cm)   Findings    +-----------------+-------------+----------+--------------+ Shoulder                                not visualized +-----------------+-------------+----------+--------------+ Prox upper  arm                          not visualized +-----------------+-------------+----------+--------------+ Mid upper arm                           not visualized +-----------------+-------------+----------+--------------+ Dist upper arm                          not visualized +-----------------+-------------+----------+--------------+ Antecubital fossa                       not visualized +-----------------+-------------+----------+--------------+ Prox forearm                            not visualized +-----------------+-------------+----------+--------------+ Mid forearm                              not visualized +-----------------+-------------+----------+--------------+ Distal forearm                          not visualized +-----------------+-------------+----------+--------------+ Elbow                                   not visualized +-----------------+-------------+----------+--------------+ Wrist                                   not visualized +-----------------+-------------+----------+--------------+ Varicose veins seen in proximal upper arm +-----------------+-------------+----------+---------+ Left Cephalic    Diameter (cm)Depth (cm)Findings  +-----------------+-------------+----------+---------+ Shoulder             0.31        1.63             +-----------------+-------------+----------+---------+ Prox upper arm       0.36        1.07             +-----------------+-------------+----------+---------+ Mid upper arm        0.34        0.92             +-----------------+-------------+----------+---------+ Dist upper arm       0.21        0.64   branching +-----------------+-------------+----------+---------+ Antecubital fossa    0.55        0.66   thrombus  +-----------------+-------------+----------+---------+ Prox forearm         0.31        0.81             +-----------------+-------------+----------+---------+ Mid forearm          0.37        0.77             +-----------------+-------------+----------+---------+ Dist forearm         0.34        0.58   branching +-----------------+-------------+----------+---------+ Wrist                0.35        0.50             +-----------------+-------------+----------+---------+ +-----------------+-------------+----------+--------------+ Left Basilic     Diameter (cm)Depth (cm)   Findings    +-----------------+-------------+----------+--------------+ Shoulder  not visualized +-----------------+-------------+----------+--------------+  Prox upper arm                          not visualized +-----------------+-------------+----------+--------------+ Mid upper arm                           not visualized +-----------------+-------------+----------+--------------+ Dist upper arm                          not visualized +-----------------+-------------+----------+--------------+ Antecubital fossa                       not visualized +-----------------+-------------+----------+--------------+ Prox forearm                            not visualized +-----------------+-------------+----------+--------------+ Mid forearm                             not visualized +-----------------+-------------+----------+--------------+ Distal forearm                          not visualized +-----------------+-------------+----------+--------------+ Elbow                                   not visualized +-----------------+-------------+----------+--------------+ Wrist                                   not visualized +-----------------+-------------+----------+--------------+ *See table(s) above for measurements and observations.  Diagnosing physician: Quay Burow MD Electronically signed by Quay Burow MD on 05/12/2018 at 4:49:44 PM.    Final      LOS: 9 days   Oren Binet, MD  [facility]  If 7PM-7AM, please contact night-coverage  Please page via [email or web address] on MD name and type text message  05/19/2018, 2:07 PM

## 2018-05-19 NOTE — Telephone Encounter (Signed)
sch appt spk to pt mld ltr 06/04/18 4pm wound check

## 2018-05-19 NOTE — Progress Notes (Addendum)
  Progress Note    05/19/2018 8:16 AM 1 Day Post-Op  Subjective:  Denies rest pain LLE; pain at L groin incision   Vitals:   05/18/18 2115 05/19/18 0606  BP: 123/62 125/68  Pulse: 86 84  Resp: 19 20  Temp: 99.1 F (37.3 C) 98 F (36.7 C)  SpO2: 97% 99%   Physical Exam: Lungs:  Non labored Incisions:  L groin incision and counter incision c/d/i; no palpable hematoma, soft thrill; audible bruit Extremities:  Feet symmetrically warm to touch with god cap refill Abdomen:  Soft Neurologic: A&O  CBC    Component Value Date/Time   WBC 10.4 05/19/2018 0524   RBC 3.05 (L) 05/19/2018 0524   HGB 7.6 (L) 05/19/2018 0524   HCT 25.9 (L) 05/19/2018 0524   PLT 238 05/19/2018 0524   MCV 84.9 05/19/2018 0524   MCH 24.9 (L) 05/19/2018 0524   MCHC 29.3 (L) 05/19/2018 0524   RDW 14.6 05/19/2018 0524   LYMPHSABS 0.4 (L) 05/10/2018 1521   MONOABS 0.7 05/10/2018 1521   EOSABS 0.2 05/10/2018 1521   BASOSABS 0.0 05/10/2018 1521    BMET    Component Value Date/Time   NA 136 05/19/2018 0524   K 4.7 05/19/2018 0524   CL 98 05/19/2018 0524   CO2 25 05/19/2018 0524   GLUCOSE 101 (H) 05/19/2018 0524   BUN 51 (H) 05/19/2018 0524   CREATININE 9.44 (H) 05/19/2018 0524   CALCIUM 8.8 (L) 05/19/2018 0524   GFRNONAA 6 (L) 05/19/2018 0524   GFRAA 7 (L) 05/19/2018 0524    INR    Component Value Date/Time   INR 1.21 05/11/2018 0344     Intake/Output Summary (Last 24 hours) at 05/19/2018 0816 Last data filed at 05/18/2018 1706 Gross per 24 hour  Intake 580 ml  Output 3300 ml  Net -2720 ml     Assessment/Plan:  42 y.o. male is s/p L AV thigh graft 1 Day Post-Op   Patent L thigh AV graft without steal symptoms Educated pt on keeping dry gauze on L groin incision Ok for discharge from vascular standpoint Office will arrange appt in 2 weeks for wound check   Dagoberto Ligas, PA-C [facility] [phone] 05/19/2018 8:16 AM     I have independently  interviewed and examined patient and agree with PA assessment and plan above.   Brandon C. Donzetta Matters, MD [facility] [place] Office: [phone] Pager: [phone]

## 2018-05-20 LAB — GLUCOSE, CAPILLARY
GLUCOSE-CAPILLARY: 141 mg/dL — AB (ref 70–99)
Glucose-Capillary: 102 mg/dL — ABNORMAL HIGH (ref 70–99)
Glucose-Capillary: 94 mg/dL (ref 70–99)

## 2018-05-20 LAB — RENAL FUNCTION PANEL
Albumin: 2.4 g/dL — ABNORMAL LOW (ref 3.5–5.0)
Anion gap: 16 — ABNORMAL HIGH (ref 5–15)
BUN: 66 mg/dL — ABNORMAL HIGH (ref 6–20)
CO2: 23 mmol/L (ref 22–32)
Calcium: 8.5 mg/dL — ABNORMAL LOW (ref 8.9–10.3)
Chloride: 97 mmol/L — ABNORMAL LOW (ref 98–111)
Creatinine, Ser: 11.97 mg/dL — ABNORMAL HIGH (ref 0.61–1.24)
GFR calc non Af Amer: 5 mL/min — ABNORMAL LOW (ref >60)
GFR, EST AFRICAN AMERICAN: 5 mL/min — AB (ref >60)
Glucose, Bld: 157 mg/dL — ABNORMAL HIGH (ref 70–99)
PHOSPHORUS: 9.4 mg/dL — AB (ref 2.5–4.6)
Potassium: 3.8 mmol/L (ref 3.5–5.1)
Sodium: 136 mmol/L (ref 135–145)

## 2018-05-20 LAB — CBC
HCT: 22.5 % — ABNORMAL LOW (ref 39.0–52.0)
HEMOGLOBIN: 6.7 g/dL — AB (ref 13.0–17.0)
MCH: 25.9 pg — ABNORMAL LOW (ref 26.0–34.0)
MCHC: 29.8 g/dL — ABNORMAL LOW (ref 30.0–36.0)
MCV: 86.9 fL (ref 80.0–100.0)
PLATELETS: 194 10*3/uL (ref 150–400)
RBC: 2.59 MIL/uL — ABNORMAL LOW (ref 4.22–5.81)
RDW: 14.5 % (ref 11.5–15.5)
WBC: 5.5 10*3/uL (ref 4.0–10.5)
nRBC: 0 % (ref 0.0–0.2)

## 2018-05-20 LAB — PREPARE RBC (CROSSMATCH)

## 2018-05-20 MED ORDER — HEPARIN SODIUM (PORCINE) 1000 UNIT/ML DIALYSIS
1000.0000 [IU] | INTRAMUSCULAR | Status: DC | PRN
  Administered 2018-05-20: 1000 [IU] via INTRAVENOUS_CENTRAL
  Filled 2018-05-20: qty 1

## 2018-05-20 MED ORDER — HEPARIN SODIUM (PORCINE) 1000 UNIT/ML IJ SOLN
INTRAMUSCULAR | Status: AC
  Administered 2018-05-20: 1000 [IU] via INTRAVENOUS_CENTRAL
  Filled 2018-05-20: qty 6

## 2018-05-20 MED ORDER — SODIUM CHLORIDE 0.9 % IV SOLN: 100.0000 mL | INTRAVENOUS | Status: DC | PRN

## 2018-05-20 MED ORDER — CHLORHEXIDINE GLUCONATE CLOTH 2 % EX PADS
6.0000 | MEDICATED_PAD | Freq: Every day | CUTANEOUS | Status: DC
  Administered 2018-05-20 – 2018-05-21 (×2): 6 via TOPICAL

## 2018-05-20 MED ORDER — SODIUM CHLORIDE 0.9% IV SOLUTION: Freq: Once | INTRAVENOUS | Status: DC

## 2018-05-20 MED ORDER — FUROSEMIDE 10 MG/ML IJ SOLN: 20.0000 mg | Freq: Once | INTRAMUSCULAR | Status: DC

## 2018-05-20 MED ORDER — ACETAMINOPHEN 325 MG PO TABS: 650.0000 mg | ORAL_TABLET | Freq: Once | ORAL | Status: DC

## 2018-05-20 MED ORDER — CALCITRIOL 0.5 MCG PO CAPS
ORAL_CAPSULE | ORAL | Status: AC
  Filled 2018-05-20: qty 1

## 2018-05-20 MED ORDER — DIPHENHYDRAMINE HCL 50 MG/ML IJ SOLN: 25.0000 mg | Freq: Once | INTRAMUSCULAR | Status: DC

## 2018-05-20 NOTE — Progress Notes (Addendum)
[facility] ROUNDING NOTE   Subjective:   Appears to be doing well no complaints.  AV graft revision left groin.  Incision site clean and dry.  Dialysis cath right groin  Blood pressure 131/48 pulse 63 temperature 98.1 O2 sats 100%  Sodium 136 potassium 4.7 chloride 98 CO2 25 BUN 51 creatinine 9.44 glucose 101 calcium 8.8 phosphorus 8.7 albumin 2.5 hemoglobin 7.6 WBC 10.4 platelets 238  Objective:  Vital signs in last 24 hours:  Temp:  [97.9 F (36.6 C)-98.7 F (37.1 C)] 98.1 F (36.7 C) (12/12 0546) Pulse Rate:  [63-126] 63 (12/12 0546) Resp:  [16-23] 16 (12/12 0546) BP: (106-131)/(48-64) 131/48 (12/12 0546) SpO2:  [96 %-100 %] 100 % (12/12 0546)  Weight change:  Filed Weights   05/18/18 0500 05/18/18 1300 05/18/18 1706  Weight: (!) 154.7 kg (!) 155.3 kg (!) 151.5 kg    Intake/Output: I/O last 3 completed shifts: In: 704.7 [P.O.:600; I.V.:104.7] Out: -    Intake/Output this shift:  No intake/output data recorded.  CVS- RRR RS- CTA ABD- BS present soft non-distended EXT- no edema   right femoral hemodialysis catheter trace lower extremity edema   Basic Metabolic Panel: Recent Labs  Lab 05/14/18 0335 05/16/18 0423 05/18/18 0456 05/18/18 1915 05/19/18 0524  NA 136 135 137 135 136  K 3.9 4.5 4.4 4.6 4.7  CL 99 97* 97* 97* 98  CO2 23 24 23 22 25   GLUCOSE 98 102* 94 115* 101*  BUN 42* 44* 77* 40* 51*  CREATININE 10.08* 9.42* 13.20* 7.83* 9.44*  CALCIUM 8.1* 8.7* 8.4* 8.6* 8.8*  PHOS  --  7.6* 9.4* 6.4* 8.7*    Liver Function Tests: Recent Labs  Lab 05/16/18 0423 05/18/18 0456 05/18/18 1915 05/19/18 0524  ALBUMIN 2.5* 2.6* 2.8* 2.5*   No results for input(s): LIPASE, AMYLASE in the last 168 hours. No results for input(s): AMMONIA in the last 168 hours.  CBC: Recent Labs  Lab 05/14/18 0335 05/16/18 0423 05/18/18 0456 05/18/18 1915 05/19/18 0524  WBC 6.5 7.6 8.4 14.1* 10.4  HGB 8.4* 8.4* 8.0* 8.5* 7.6*  HCT 27.4* 27.7* 27.7*  28.7* 25.9*  MCV 83.8 84.2 86.3 83.4 84.9  PLT 157 183 214 230 238    Cardiac Enzymes: No results for input(s): CKTOTAL, CKMB, CKMBINDEX, TROPONINI in the last 168 hours.  BNP: Invalid input(s): POCBNP  CBG: Recent Labs  Lab 05/19/18 0750 05/19/18 1234 05/19/18 1659 05/19/18 2224 05/20/18 0830  GLUCAP 105* 100* 132* 130* 102*    Microbiology: Results for orders placed or performed during the hospital encounter of 05/10/18  Urine Culture     Status: None   Collection Time: 05/10/18  6:14 PM  Result Value Ref Range Status   Specimen Description URINE, CLEAN CATCH  Final   Special Requests NONE  Final   Culture   Final    NO GROWTH Performed at [facility], [place] [address], [place], [place] [address]    Report Status 05/11/2018 FINAL  Final  Culture, blood (Routine X 2) w Reflex to ID Panel     Status: None   Collection Time: 05/11/18 11:32 AM  Result Value Ref Range Status   Specimen Description BLOOD BLOOD LEFT FOREARM  Final   Special Requests   Final    BOTTLES DRAWN AEROBIC ONLY Blood Culture adequate volume   Culture   Final    NO GROWTH 5 DAYS Performed at [facility], [place] [address], [place], [place] [address]  Report Status 05/16/2018 FINAL  Final  Culture, blood (Routine X 2) w Reflex to ID Panel     Status: None   Collection Time: 05/11/18  6:24 PM  Result Value Ref Range Status   Specimen Description BLOOD LEFT ANTECUBITAL  Final   Special Requests   Final    BOTTLES DRAWN AEROBIC ONLY Blood Culture results may not be optimal due to an inadequate volume of blood received in culture bottles   Culture   Final    NO GROWTH 5 DAYS Performed at [facility], [place] [address], [place], [place] [address]    Report Status 05/16/2018 FINAL  Final  MRSA PCR Screening     Status: None   Collection Time: 05/11/18  6:30 PM  Result Value Ref Range Status   MRSA by PCR NEGATIVE NEGATIVE Final    Comment:        The GeneXpert MRSA Assay  (FDA approved for NASAL specimens only), is one component of a comprehensive MRSA colonization surveillance program. It is not intended to diagnose MRSA infection nor to guide or monitor treatment for MRSA infections. Performed at [facility], [place] [address], [place],  [address]     Coagulation Studies: No results for input(s): LABPROT, INR in the last 72 hours.  Urinalysis: No results for input(s): COLORURINE, LABSPEC, PHURINE, GLUCOSEU, HGBUR, BILIRUBINUR, KETONESUR, PROTEINUR, UROBILINOGEN, NITRITE, LEUKOCYTESUR in the last 72 hours.  Invalid input(s): APPERANCEUR    Imaging: No results found.   Medications:   . sodium chloride Stopped (05/18/18 2208)  . sodium chloride Stopped (05/18/18 2208)   . sodium chloride   Intravenous Once  . aspirin EC  81 mg Oral Daily  . atorvastatin  20 mg Oral Daily  . calcitRIOL  0.5 mcg Oral Q T,Th,Sa-HD  . carvedilol  25 mg Oral BID  . chlorhexidine  15 mL Mouth Rinse BID  . Chlorhexidine Gluconate Cloth  6 each Topical Q0600  . Chlorhexidine Gluconate Cloth  6 each Topical Q0600  . cloNIDine  0.2 mg Oral TID  . darbepoetin (ARANESP) injection - DIALYSIS  100 mcg Intravenous Q Tue-HD  . heparin  5,000 Units Subcutaneous Q8H  . hydrALAZINE  50 mg Oral Q8H  . insulin aspart  0-9 Units Subcutaneous TID WC  . mouth rinse  15 mL Mouth Rinse q12n4p  . mycophenolate  1,000 mg Oral BID  . omega-3 acid ethyl esters  1 g Oral Daily  . predniSONE  5 mg Oral Q breakfast  . sodium chloride flush  3 mL Intravenous Q12H  . sodium chloride flush  3 mL Intravenous Q12H  . sucroferric oxyhydroxide  500 mg Oral TID WC  . 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  Appreciate assistance from Dr. Servando Snare, plan for AV loop graft right femoral and possible left femoral TDC.  Plan dialysis 05/20/2018 clip process in place  DDRT with history of DGF at [facility] patient using 5 mg of prednisone daily MMF 1 g twice daily tacrolimus 1.5 twice daily, recent decreased dose 05/16/2018.  Has been being evaluated and seen at [facility] and not considered a candidate for repeat biopsy of transplant kidney  Hypertension  appears to be under better control.  Diabetes controlled  Secondary hyperparathyroidism PTH 324 started Rocaltrol 0.5 mg with hemodialysis    hyperphosphatemia will start Velphoro 500 mg with meals  Hyperlipidemia continues on atorvastatin  Anemia transfused 05/12/2018   darbepoetin.  100 mcg administered 05/18/2018  Obstructive sleep apnea using CPAP  Stable from nephrology standpoint for discharge once dialysis unit ascribed   LOS: [place] @TODAY @10 :23 AM

## 2018-05-20 NOTE — Progress Notes (Signed)
PROGRESS NOTE        PATIENT DETAILS Name: [name] Age: 42 y.o. Sex: male Date of Birth: 10-May-1976 Admit Date: 05/10/2018 Admitting Physician Eugenie Filler, MD [id number], Einar Pheasant, DO  Brief Narrative: Patient is a 42 y.o. male with history of renal transplant on immunosuppressive's-presenting with worsening renal function and significant volume overload-now thought to have ESRD and started on hemodialysis.  See below for further details  Subjective: No major complaints-denies any chest pain or shortness of breath.  Assessment/Plan AKI on CKD stage IV-now ESRD: Nephrology following and directing HD cath, still awaiting outpatient HD chair-In process.  Per nephrology-patient not a candidate for repeat biopsy of transplant kidney.  Vascular surgery placed left AV graft on thigh on 12/10.  Remains on Prograf, prednisone and CellCept which will be managed by nephrology in the outpatient setting.    Volume overload due to worsening renal function: Much improved after initiation of HD-appears euvolemic.   Hypertension: Controlled-continue hydralazine, clonidine and Coreg.  Followed adjust accordingly.    Anemia: Likely secondary to worsening renal function-we will transfuse 1 additional unit of PRBC today (total of 2 units so far this admission).  Dyslipidemia: Continue statin  DM-2: CBGs stable with SSI.  Not on any oral hypoglycemics in the outpatient setting-most recent A1c is 5.8.    OSA: CPAP nightly  Morbid obesity  DVT Prophylaxis: Prophylactic Heparin   Code Status: Full code   Family Communication: None at bedside  Disposition Plan: Remain inpatient-awaiting outpatient HD chair before discharge  Antimicrobial agents: Anti-infectives (From admission, onward)   Start     Dose/Rate Route Frequency Ordered Stop   05/18/18 0600  ceFAZolin (ANCEF) 3 g in dextrose 5 % 50 mL IVPB     3 g 100 mL/hr over 30 Minutes Intravenous On call  to O.R. 05/17/18 0809 05/18/18 1939   05/11/18 1400  ceFAZolin (ANCEF) IVPB 1 g/50 mL premix     1 g 100 mL/hr over 30 Minutes Intravenous To ShortStay Surgical 05/11/18 1119 05/11/18 1604   05/11/18 1130  ceFAZolin (ANCEF) IVPB 1 g/50 mL premix  Status:  Discontinued     1 g 100 mL/hr over 30 Minutes Intravenous  Once 05/11/18 1115 05/11/18 1119      Procedures: 12/10>> Left femoral AV loop graft with 4-7 mm PTFE  CONSULTS:  nephrology and vascular surgery  Time spent: 25- minutes-Greater than 50% of this time was spent in counseling, explanation of diagnosis, planning of further management, and coordination of care.  MEDICATIONS: Scheduled Meds: . sodium chloride   Intravenous Once  . sodium chloride   Intravenous Once  . acetaminophen  650 mg Oral Once  . aspirin EC  81 mg Oral Daily  . atorvastatin  20 mg Oral Daily  . calcitRIOL  0.5 mcg Oral Q T,Th,Sa-HD  . carvedilol  25 mg Oral BID  . chlorhexidine  15 mL Mouth Rinse BID  . Chlorhexidine Gluconate Cloth  6 each Topical Q0600  . Chlorhexidine Gluconate Cloth  6 each Topical Q0600  . Chlorhexidine Gluconate Cloth  6 each Topical Q0600  . cloNIDine  0.2 mg Oral TID  . darbepoetin (ARANESP) injection - DIALYSIS  100 mcg Intravenous Q Tue-HD  . diphenhydrAMINE  25 mg Intravenous Once  . furosemide  20 mg Intravenous Once  . heparin  5,000 Units Subcutaneous Q8H  .  hydrALAZINE  50 mg Oral Q8H  . insulin aspart  0-9 Units Subcutaneous TID WC  . mouth rinse  15 mL Mouth Rinse q12n4p  . mycophenolate  1,000 mg Oral BID  . omega-3 acid ethyl esters  1 g Oral Daily  . predniSONE  5 mg Oral Q breakfast  . sodium chloride flush  3 mL Intravenous Q12H  . sodium chloride flush  3 mL Intravenous Q12H  . sucroferric oxyhydroxide  500 mg Oral TID WC  . tacrolimus  1.5 mg Oral BID   Continuous Infusions: . sodium chloride Stopped (05/18/18 2208)  . sodium chloride Stopped (05/18/18 2208)  . sodium chloride    . sodium  chloride     PRN Meds:.sodium chloride, sodium chloride, sodium chloride, acetaminophen **OR** acetaminophen, heparin, hydrALAZINE, HYDROcodone-acetaminophen, ondansetron **OR** ondansetron (ZOFRAN) IV, polyethylene glycol, sodium chloride flush, sorbitol   PHYSICAL EXAM: Vital signs: Vitals:   05/20/18 1400 05/20/18 1430 05/20/18 1441 05/20/18 1456  BP: (!) 114/55 119/64 127/67 121/69  Pulse: 80 79 77 76  Resp:   16 16  Temp:   98 F (36.7 C) 98 F (36.7 C)  TempSrc:      SpO2:      Weight:      Height:       Filed Weights   05/18/18 1300 05/18/18 1706 05/20/18 1158  Weight: (!) 155.3 kg (!) 151.5 kg (!) 152.7 kg   Body mass index is 43.22 kg/m.   General appearance:Awake, alert, not in any distress.  Eyes:no scleral icterus. HEENT: Atraumatic and Normocephalic Neck: supple, no JVD. Resp:Good air entry bilaterally,no rales or rhonchi CVS: S1 S2 regular, no murmurs.  GI: Bowel sounds present, Non tender and not distended with no gaurding, rigidity or rebound. Extremities: B/L Lower Ext shows no edema, both legs are warm to touch Neurology:  Non focal Psychiatric: Normal judgment and insight. Normal mood. Musculoskeletal:No digital cyanosis Skin:No Rash, warm and dry Wounds:N/A  I have personally reviewed following labs and imaging studies  LABORATORY DATA: CBC: Recent Labs  Lab 05/16/18 0423 05/18/18 0456 05/18/18 1915 05/19/18 0524 05/20/18 1214  WBC 7.6 8.4 14.1* 10.4 5.5  HGB 8.4* 8.0* 8.5* 7.6* 6.7*  HCT 27.7* 27.7* 28.7* 25.9* 22.5*  MCV 84.2 86.3 83.4 84.9 86.9  PLT 183 214 230 238 952    Basic Metabolic Panel: Recent Labs  Lab 05/16/18 0423 05/18/18 0456 05/18/18 1915 05/19/18 0524 05/20/18 1213  NA 135 137 135 136 136  K 4.5 4.4 4.6 4.7 3.8  CL 97* 97* 97* 98 97*  CO2 '24 23 22 25 23  ' GLUCOSE 102* 94 115* 101* 157*  BUN 44* 77* 40* 51* 66*  CREATININE 9.42* 13.20* 7.83* 9.44* 11.97*  CALCIUM 8.7* 8.4* 8.6* 8.8* 8.5*  PHOS 7.6* 9.4*  6.4* 8.7* 9.4*    GFR: Estimated Creatinine Clearance: 12.4 mL/min (A) (by C-G formula based on SCr of 11.97 mg/dL (H)).  Liver Function Tests: Recent Labs  Lab 05/16/18 0423 05/18/18 0456 05/18/18 1915 05/19/18 0524 05/20/18 1213  ALBUMIN 2.5* 2.6* 2.8* 2.5* 2.4*   No results for input(s): LIPASE, AMYLASE in the last 168 hours. No results for input(s): AMMONIA in the last 168 hours.  Coagulation Profile: No results for input(s): INR, PROTIME in the last 168 hours.  Cardiac Enzymes: No results for input(s): CKTOTAL, CKMB, CKMBINDEX, TROPONINI in the last 168 hours.  BNP (last 3 results) No results for input(s): PROBNP in the last 8760 hours.  HbA1C: No results for input(s):  HGBA1C in the last 72 hours.  CBG: Recent Labs  Lab 05/19/18 0750 05/19/18 1234 05/19/18 1659 05/19/18 2224 05/20/18 0830  GLUCAP 105* 100* 132* 130* 102*    Lipid Profile: No results for input(s): CHOL, HDL, LDLCALC, TRIG, CHOLHDL, LDLDIRECT in the last 72 hours.  Thyroid Function Tests: No results for input(s): TSH, T4TOTAL, FREET4, T3FREE, THYROIDAB in the last 72 hours.  Anemia Panel: No results for input(s): VITAMINB12, FOLATE, FERRITIN, TIBC, IRON, RETICCTPCT in the last 72 hours.  Urine analysis:    Component Value Date/Time   COLORURINE STRAW (A) 05/10/2018 1811   APPEARANCEUR CLEAR 05/10/2018 1811   LABSPEC 1.012 05/10/2018 1811   PHURINE 7.0 05/10/2018 1811   GLUCOSEU 150 (A) 05/10/2018 1811   HGBUR SMALL (A) 05/10/2018 1811   BILIRUBINUR NEGATIVE 05/10/2018 1811   BILIRUBINUR negative 01/25/2018 0932   KETONESUR NEGATIVE 05/10/2018 1811   PROTEINUR >=300 (A) 05/10/2018 1811   UROBILINOGEN 0.2 01/25/2018 0932   NITRITE NEGATIVE 05/10/2018 1811   LEUKOCYTESUR TRACE (A) 05/10/2018 1811    Sepsis Labs: Lactic Acid, Venous No results found for: LATICACIDVEN  MICROBIOLOGY: Recent Results (from the past 240 hour(s))  Urine Culture     Status: None   Collection Time:  05/10/18  6:14 PM  Result Value Ref Range Status   Specimen Description URINE, CLEAN CATCH  Final   Special Requests NONE  Final   Culture   Final    NO GROWTH Performed at [facility], [place] [address], [place], [place] [address]    Report Status 05/11/2018 FINAL  Final  Culture, blood (Routine X 2) w Reflex to ID Panel     Status: None   Collection Time: 05/11/18 11:32 AM  Result Value Ref Range Status   Specimen Description BLOOD BLOOD LEFT FOREARM  Final   Special Requests   Final    BOTTLES DRAWN AEROBIC ONLY Blood Culture adequate volume   Culture   Final    NO GROWTH 5 DAYS Performed at [facility], [place] [address], [place], [place] [address]    Report Status 05/16/2018 FINAL  Final  Culture, blood (Routine X 2) w Reflex to ID Panel     Status: None   Collection Time: 05/11/18  6:24 PM  Result Value Ref Range Status   Specimen Description BLOOD LEFT ANTECUBITAL  Final   Special Requests   Final    BOTTLES DRAWN AEROBIC ONLY Blood Culture results may not be optimal due to an inadequate volume of blood received in culture bottles   Culture   Final    NO GROWTH 5 DAYS Performed at [facility], [place] [address] [place], [place] [address]    Report Status 05/16/2018 FINAL  Final  MRSA PCR Screening     Status: None   Collection Time: 05/11/18  6:30 PM  Result Value Ref Range Status   MRSA by PCR NEGATIVE NEGATIVE Final    Comment:        The GeneXpert MRSA Assay (FDA approved for NASAL specimens only), is one component of a comprehensive MRSA colonization surveillance program. It is not intended to diagnose MRSA infection nor to guide or monitor treatment for MRSA infections. Performed at [facility], [place] [address] [place], [place] [address]     RADIOLOGY STUDIES/RESULTS: Dg Chest 2 View  Result Date: 05/10/2018 CLINICAL DATA:  Generalized swelling. EXAM: CHEST - 2 VIEW COMPARISON:  December 13, 2010 FINDINGS: The heart size and mediastinal  contours are within normal limits. Both lungs are clear. The visualized skeletal structures are unremarkable. IMPRESSION: No active cardiopulmonary disease. Electronically Signed   By: Dorise Bullion III M.D   On: 05/10/2018 16:16   US Renal Transplant W/doppler  Result Date: 05/10/2018 CLINICAL DATA:  Acute renal failure.  Renal transplant. EXAM: ULTRASOUND OF RENAL TRANSPLANT WITH RENAL DOPPLER ULTRASOUND TECHNIQUE: Ultrasound examination of the renal transplant was performed with gray-scale, color and duplex doppler evaluation. COMPARISON:  None. FINDINGS: Transplant kidney location: Right lower quadrant Transplant Kidney: Renal measurements: 12.6 x 4.8 x 3.8 cm = volume: 118.44m. Normal in size and parenchymal echogenicity. No evidence of mass or hydronephrosis. Color flow in the main renal artery:  Yes Color flow in the main renal vein:  Yes Duplex Doppler Evaluation: Main Renal Artery Resistive Index: 0.82 Venous waveform in main renal vein:  Present Intrarenal resistive index in upper pole:  0.79 (normal 0.6-0.8; equivocal 0.8-0.9; abnormal >= 0.9) Intrarenal resistive index in lower pole: 0.56 (normal 0.6-0.8; equivocal 0.8-0.9; abnormal >= 0.9) Bladder: Not visualized Other findings: There is a 7.1 x 5.6 x 4.8 complex fluid collection noted in the right lower quadrant adjacent to the lower pole of the transplant kidney. This [facility] this measured up to 8.6 cm. IMPRESSION: Right lower quadrant renal transplant. No acute findings. No hydronephrosis. 7.1 cm complex fluid collection in the right lower quadrant adjacent to the lower pole of the transplant kidney. This has decreased in size when [facility] Electronically Signed   By: KRolm BaptiseM.D.   On: 05/10/2018 21:47   Ir Fluoro Guide Cv Line Right  Result Date: 05/11/2018 INDICATION: History of end-stage renal disease now with  failed renal transplant. Patient with multiple previous tunneled dialysis catheters and concern for upper extremity central venous occlusion demonstrated on remote declot performed 02/17/2018 and as patient previously dialyzed via a right femoral approach dialysis catheter prior to receiving his renal transplant. EXAM: TUNNELED CENTRAL VENOUS HEMODIALYSIS CATHETER PLACEMENT WITH ULTRASOUND AND FLUOROSCOPIC GUIDANCE MEDICATIONS: Ancef 1 gm IV . The antibiotic was given in an appropriate time interval prior to skin puncture. ANESTHESIA/SEDATION: Versed 1.5 mg IV; Fentanyl 100 mcg IV; Moderate Sedation Time:  30 minutes The patient was continuously monitored during the procedure by the interventional radiology nurse under my direct supervision. FLUOROSCOPY TIME:  2 minutes, 24 seconds (41 mGy) COMPLICATIONS: None immediate. PROCEDURE: Informed written consent was obtained from the patient after a discussion of the risks, benefits, and alternatives to treatment. Questions regarding the procedure were encouraged and answered. Given above, decision was made to proceed with a tunneled right common femoral approach dialysis catheter. Sonographic evaluation demonstrated persistent patency of the right common femoral vein. As such, the right groin and superolateral aspect the right thigh was prepped with chlorhexidine in a sterile fashion, and a sterile drape was applied covering the operative field. Maximum barrier sterile technique with sterile gowns and gloves were used for the procedure. A timeout was performed prior to the initiation of the procedure. After creating a small venotomy incision, a micropuncture kit was utilized to access the right common femoral vein. Real-time ultrasound guidance was utilized for vascular access including the acquisition of a permanent ultrasound image documenting patency of the accessed vessel. A stiff glidewire was utilized for measurement purposes Next, the micropuncture sheath was  exchanged for a peel-away sheath under fluoroscopic guidance. A asked split tunneled  hemodialysis catheter measuring 55 cm from tip to cuff was tunneled in a retrograde fashion from the anterolateral aspect the right thigh to the venotomy incision. The catheter was then placed through the peel-away sheath with tips ultimately positioned within the perihepatic IVC. Given concern for slightly caudal positioning of the tips of the dialysis catheter, the decision was made to create a shorter length subcutaneous track. Note, despite measurement, patient has a large amount of redundant tissue about the thigh which resulted in suboptimal catheter tip positioning. As such, a more proximal location on the patient's anterolateral aspect right thigh was selected and utilized to tunnel a new 55 cm dialysis catheter to the venotomy incision. Existing dialysis catheter was retracted from its access site at the venotomy site. The peripheral aspect of the dialysis catheter was cut, the peripheral component was removed. A stiff glidewire was utilized to cannulate the remainder of the remaining portion of the existing dialysis catheter and advanced to the level of the inferior cavoatrial junction. Next, the remaining portion of the initial dialysis catheter was exchanged for a new the low a sheath under fluoroscopic guidance. Finally, the new 55 cm tip to cuff dialysis catheter was inserted through the peel-away sheath with tip ultimately terminating within the inferior cavoatrial junction. Final catheter positioning was confirmed and documented with a spot fluoroscopic images. The catheter aspirates and flushes normally. The catheter was flushed with appropriate volume heparin dwells. The catheter exit site was secured with a 0-Prolene retention suture. The venotomy incision was closed with Dermabond and Steri-strips. Dressings were applied. The patient tolerated the procedure well without immediate post procedural complication.  IMPRESSION: Successful placement of 55 cm tip to cuff tunneled hemodialysis catheter via the right common femoral vein vein with tips terminating within the inferior cavoatrial junction. The catheter is ready for immediate use. Electronically Signed   By: Sandi Mariscal M.D.   On: 05/11/2018 17:46   Ir US Guide Vasc Access Right  Result Date: 05/11/2018 INDICATION: History of end-stage renal disease now with failed renal transplant. Patient with multiple previous tunneled dialysis catheters and concern for upper extremity central venous occlusion demonstrated on remote declot performed 02/17/2018 and as patient previously dialyzed via a right femoral approach dialysis catheter prior to receiving his renal transplant. EXAM: TUNNELED CENTRAL VENOUS HEMODIALYSIS CATHETER PLACEMENT WITH ULTRASOUND AND FLUOROSCOPIC GUIDANCE MEDICATIONS: Ancef 1 gm IV . The antibiotic was given in an appropriate time interval prior to skin puncture. ANESTHESIA/SEDATION: Versed 1.5 mg IV; Fentanyl 100 mcg IV; Moderate Sedation Time:  30 minutes The patient was continuously monitored during the procedure by the interventional radiology nurse under my direct supervision. FLUOROSCOPY TIME:  2 minutes, 24 seconds (41 mGy) COMPLICATIONS: None immediate. PROCEDURE: Informed written consent was obtained from the patient after a discussion of the risks, benefits, and alternatives to treatment. Questions regarding the procedure were encouraged and answered. Given above, decision was made to proceed with a tunneled right common femoral approach dialysis catheter. Sonographic evaluation demonstrated persistent patency of the right common femoral vein. As such, the right groin and superolateral aspect the right thigh was prepped with chlorhexidine in a sterile fashion, and a sterile drape was applied covering the operative field. Maximum barrier sterile technique with sterile gowns and gloves were used for the procedure. A timeout was performed prior  to the initiation of the procedure. After creating a small venotomy incision, a micropuncture kit was utilized to access the right common femoral vein. Real-time ultrasound guidance was utilized for vascular  access including the acquisition of a permanent ultrasound image documenting patency of the accessed vessel. A stiff glidewire was utilized for measurement purposes Next, the micropuncture sheath was exchanged for a peel-away sheath under fluoroscopic guidance. A asked split tunneled hemodialysis catheter measuring 55 cm from tip to cuff was tunneled in a retrograde fashion from the anterolateral aspect the right thigh to the venotomy incision. The catheter was then placed through the peel-away sheath with tips ultimately positioned within the perihepatic IVC. Given concern for slightly caudal positioning of the tips of the dialysis catheter, the decision was made to create a shorter length subcutaneous track. Note, despite measurement, patient has a large amount of redundant tissue about the thigh which resulted in suboptimal catheter tip positioning. As such, a more proximal location on the patient's anterolateral aspect right thigh was selected and utilized to tunnel a new 55 cm dialysis catheter to the venotomy incision. Existing dialysis catheter was retracted from its access site at the venotomy site. The peripheral aspect of the dialysis catheter was cut, the peripheral component was removed. A stiff glidewire was utilized to cannulate the remainder of the remaining portion of the existing dialysis catheter and advanced to the level of the inferior cavoatrial junction. Next, the remaining portion of the initial dialysis catheter was exchanged for a new the low a sheath under fluoroscopic guidance. Finally, the new 55 cm tip to cuff dialysis catheter was inserted through the peel-away sheath with tip ultimately terminating within the inferior cavoatrial junction. Final catheter positioning was confirmed  and documented with a spot fluoroscopic images. The catheter aspirates and flushes normally. The catheter was flushed with appropriate volume heparin dwells. The catheter exit site was secured with a 0-Prolene retention suture. The venotomy incision was closed with Dermabond and Steri-strips. Dressings were applied. The patient tolerated the procedure well without immediate post procedural complication. IMPRESSION: Successful placement of 55 cm tip to cuff tunneled hemodialysis catheter via the right common femoral vein vein with tips terminating within the inferior cavoatrial junction. The catheter is ready for immediate use. Electronically Signed   By: Sandi Mariscal M.D.   On: 05/11/2018 17:46   Vas [place] Burnard Bunting With/wo Tbi  Result Date: 05/16/2018 LOWER EXTREMITY DOPPLER STUDY Indications: Pre Operative for Thigh dialysis graft.  Performing Technologist: Birdena Crandall, [place] RVS  Examination Guidelines: A complete evaluation includes at minimum, Doppler waveform signals and systolic blood pressure reading at the level of bilateral brachial, anterior tibial, and posterior tibial arteries, when vessel segments are accessible. Bilateral testing is considered an integral part of a complete examination. Photoelectric Plethysmograph (PPG) waveforms and toe systolic pressure readings are included as required and additional duplex testing as needed. Limited examinations for reoccurring indications may be performed as noted.  ABI Findings: +--------+------------------+-----+---------+--------+ Right   Rt Pressure (mmHg)IndexWaveform Comment  +--------+------------------+-----+---------+--------+ [id number]                    triphasic         +--------+------------------+-----+---------+--------+ PTA     181               1.18 triphasic         +--------+------------------+-----+---------+--------+ DP      152               0.99 triphasic         +--------+------------------+-----+---------+--------+  +--------+------------------+-----+---------+-------+ Left    Lt Pressure (mmHg)IndexWaveform Comment +--------+------------------+-----+---------+-------+ [id number]  triphasic        +--------+------------------+-----+---------+-------+ PTA     151               0.99 triphasic        +--------+------------------+-----+---------+-------+ DP      148               0.97 triphasic        +--------+------------------+-----+---------+-------+ +-------+-----------+-----------+------------+------------+ ABI/TBIToday's ABIToday's TBIPrevious ABIPrevious TBI +-------+-----------+-----------+------------+------------+ Right  1.18                                           +-------+-----------+-----------+------------+------------+ Left   0.99                                           +-------+-----------+-----------+------------+------------+  Summary: Right: Resting right ankle-brachial index is within normal range. No evidence of significant right lower extremity arterial disease. Left: Resting left ankle-brachial index is within normal range. No evidence of significant left lower extremity arterial disease.  *See table(s) above for measurements and observations.  Electronically signed by Harold Barban MD on 05/16/2018 at 2:45:17 PM.   Final    Vas [place] Upper Ext Vein Mapping (pre-op Avf)  Result Date: 05/12/2018 UPPER EXTREMITY VEIN MAPPING  Indications: Pre-access. Limitations: patient body habitus, hypersomnolence Performing Technologist: Maudry Mayhew MHA, RDMS, RVT, RDCS  Examination Guidelines: A complete evaluation includes B-mode imaging, spectral Doppler, color Doppler, and power Doppler as needed of all accessible portions of each vessel. Bilateral testing is considered an integral part of a complete examination. Limited examinations for reoccurring indications may be performed as noted. +-----------------+-------------+----------+--------------+  Right Cephalic   Diameter (cm)Depth (cm)   Findings    +-----------------+-------------+----------+--------------+ Shoulder             0.24        1.46      thrombus    +-----------------+-------------+----------+--------------+ Prox upper arm       0.25        1.50      thrombus    +-----------------+-------------+----------+--------------+ Mid upper arm        0.37        1.17      thrombus    +-----------------+-------------+----------+--------------+ Dist upper arm       0.27        0.65      thrombus    +-----------------+-------------+----------+--------------+ Antecubital fossa                       not visualized +-----------------+-------------+----------+--------------+ Prox forearm                            not visualized +-----------------+-------------+----------+--------------+ Mid forearm                             not visualized +-----------------+-------------+----------+--------------+ Dist forearm                            not visualized +-----------------+-------------+----------+--------------+ Wrist  not visualized +-----------------+-------------+----------+--------------+ +-----------------+-------------+----------+--------------+ Right Basilic    Diameter (cm)Depth (cm)   Findings    +-----------------+-------------+----------+--------------+ Shoulder                                not visualized +-----------------+-------------+----------+--------------+ Prox upper arm                          not visualized +-----------------+-------------+----------+--------------+ Mid upper arm                           not visualized +-----------------+-------------+----------+--------------+ Dist upper arm                          not visualized +-----------------+-------------+----------+--------------+ Antecubital fossa                       not visualized  +-----------------+-------------+----------+--------------+ Prox forearm                            not visualized +-----------------+-------------+----------+--------------+ Mid forearm                             not visualized +-----------------+-------------+----------+--------------+ Distal forearm                          not visualized +-----------------+-------------+----------+--------------+ Elbow                                   not visualized +-----------------+-------------+----------+--------------+ Wrist                                   not visualized +-----------------+-------------+----------+--------------+ Varicose veins seen in proximal upper arm +-----------------+-------------+----------+---------+ Left Cephalic    Diameter (cm)Depth (cm)Findings  +-----------------+-------------+----------+---------+ Shoulder             0.31        1.63             +-----------------+-------------+----------+---------+ Prox upper arm       0.36        1.07             +-----------------+-------------+----------+---------+ Mid upper arm        0.34        0.92             +-----------------+-------------+----------+---------+ Dist upper arm       0.21        0.64   branching +-----------------+-------------+----------+---------+ Antecubital fossa    0.55        0.66   thrombus  +-----------------+-------------+----------+---------+ Prox forearm         0.31        0.81             +-----------------+-------------+----------+---------+ Mid forearm          0.37        0.77             +-----------------+-------------+----------+---------+ Dist forearm         0.34        0.58   branching +-----------------+-------------+----------+---------+ Wrist  0.35        0.50             +-----------------+-------------+----------+---------+ +-----------------+-------------+----------+--------------+ Left Basilic     Diameter  (cm)Depth (cm)   Findings    +-----------------+-------------+----------+--------------+ Shoulder                                not visualized +-----------------+-------------+----------+--------------+ Prox upper arm                          not visualized +-----------------+-------------+----------+--------------+ Mid upper arm                           not visualized +-----------------+-------------+----------+--------------+ Dist upper arm                          not visualized +-----------------+-------------+----------+--------------+ Antecubital fossa                       not visualized +-----------------+-------------+----------+--------------+ Prox forearm                            not visualized +-----------------+-------------+----------+--------------+ Mid forearm                             not visualized +-----------------+-------------+----------+--------------+ Distal forearm                          not visualized +-----------------+-------------+----------+--------------+ Elbow                                   not visualized +-----------------+-------------+----------+--------------+ Wrist                                   not visualized +-----------------+-------------+----------+--------------+ *See table(s) above for measurements and observations.  Diagnosing physician: Quay Burow MD Electronically signed by Quay Burow MD on 05/12/2018 at 4:49:44 PM.    Final      LOS: 10 days   Oren Binet, MD  [facility]  If 7PM-7AM, please contact night-coverage  Please page via [email or web address]-click on MD name and type text message  05/20/2018, 3:05 PM

## 2018-05-21 DIAGNOSIS — N189 Chronic kidney disease, unspecified: Secondary | ICD-10-CM

## 2018-05-21 DIAGNOSIS — N179 Acute kidney failure, unspecified: Secondary | ICD-10-CM

## 2018-05-21 LAB — GLUCOSE, CAPILLARY
Glucose-Capillary: 102 mg/dL — ABNORMAL HIGH (ref 70–99)
Glucose-Capillary: 121 mg/dL — ABNORMAL HIGH (ref 70–99)
Glucose-Capillary: 116 mg/dL — ABNORMAL HIGH (ref 70–99)
Glucose-Capillary: 109 mg/dL — ABNORMAL HIGH (ref 70–99)

## 2018-05-21 LAB — CBC
HCT: 26.5 % — ABNORMAL LOW (ref 39.0–52.0)
HEMOGLOBIN: 8.1 g/dL — AB (ref 13.0–17.0)
MCH: 26.6 pg (ref 26.0–34.0)
MCHC: 30.6 g/dL (ref 30.0–36.0)
MCV: 86.9 fL (ref 80.0–100.0)
Platelets: 208 10*3/uL (ref 150–400)
RBC: 3.05 MIL/uL — ABNORMAL LOW (ref 4.22–5.81)
RDW: 14.5 % (ref 11.5–15.5)
WBC: 9 10*3/uL (ref 4.0–10.5)
nRBC: 0 % (ref 0.0–0.2)

## 2018-05-21 LAB — RENAL FUNCTION PANEL
Albumin: 2.5 g/dL — ABNORMAL LOW (ref 3.5–5.0)
Anion gap: 13 (ref 5–15)
BUN: 48 mg/dL — ABNORMAL HIGH (ref 6–20)
CO2: 25 mmol/L (ref 22–32)
Calcium: 8.6 mg/dL — ABNORMAL LOW (ref 8.9–10.3)
Chloride: 99 mmol/L (ref 98–111)
Creatinine, Ser: 9.46 mg/dL — ABNORMAL HIGH (ref 0.61–1.24)
GFR calc non Af Amer: 6 mL/min — ABNORMAL LOW (ref >60)
GFR, EST AFRICAN AMERICAN: 7 mL/min — AB (ref >60)
Glucose, Bld: 107 mg/dL — ABNORMAL HIGH (ref 70–99)
Phosphorus: 6.9 mg/dL — ABNORMAL HIGH (ref 2.5–4.6)
Potassium: 4.2 mmol/L (ref 3.5–5.1)
Sodium: 137 mmol/L (ref 135–145)

## 2018-05-21 LAB — BPAM RBC
Blood Product Expiration Date: 202001022359
ISSUE DATE / TIME: 201912121435
UNIT TYPE AND RH: 6200

## 2018-05-21 LAB — TYPE AND SCREEN
ABO/RH(D): A POS
Antibody Screen: NEGATIVE
Unit division: 0

## 2018-05-21 MED ORDER — LIDOCAINE-PRILOCAINE 2.5-2.5 % EX CREA
1.0000 "application " | TOPICAL_CREAM | CUTANEOUS | Status: DC | PRN
  Filled 2018-05-21: qty 5

## 2018-05-21 MED ORDER — HEPARIN SODIUM (PORCINE) 1000 UNIT/ML IJ SOLN
INTRAMUSCULAR | Status: AC
  Administered 2018-05-21: 1000 [IU] via INTRAVENOUS_CENTRAL
  Filled 2018-05-21: qty 6

## 2018-05-21 MED ORDER — HEPARIN SODIUM (PORCINE) 1000 UNIT/ML DIALYSIS
1000.0000 [IU] | INTRAMUSCULAR | Status: DC | PRN
  Administered 2018-05-21: 1000 [IU] via INTRAVENOUS_CENTRAL
  Filled 2018-05-21 (×2): qty 1

## 2018-05-21 MED ORDER — SUCROFERRIC OXYHYDROXIDE 500 MG PO CHEW
1000.0000 mg | CHEWABLE_TABLET | Freq: Three times a day (TID) | ORAL | Status: DC
  Administered 2018-05-21: 1000 mg via ORAL
  Filled 2018-05-21 (×3): qty 2

## 2018-05-21 MED ORDER — ALTEPLASE 2 MG IJ SOLR
2.0000 mg | Freq: Once | INTRAMUSCULAR | Status: DC | PRN
  Filled 2018-05-21: qty 2

## 2018-05-21 MED ORDER — HYDROCODONE-ACETAMINOPHEN 5-325 MG PO TABS: 1.0000 | ORAL_TABLET | Freq: Four times a day (QID) | ORAL | 0 refills | Status: DC | PRN

## 2018-05-21 MED ORDER — SODIUM CHLORIDE 0.9 % IV SOLN: 100.0000 mL | INTRAVENOUS | Status: DC | PRN

## 2018-05-21 MED ORDER — CARVEDILOL 25 MG PO TABS: 25.0000 mg | ORAL_TABLET | Freq: Two times a day (BID) | ORAL | 0 refills | Status: DC

## 2018-05-21 MED ORDER — PENTAFLUOROPROP-TETRAFLUOROETH EX AERO: 1.0000 "application " | INHALATION_SPRAY | CUTANEOUS | Status: DC | PRN

## 2018-05-21 MED ORDER — LIDOCAINE HCL (PF) 1 % IJ SOLN
5.0000 mL | INTRAMUSCULAR | Status: DC | PRN
  Filled 2018-05-21: qty 5

## 2018-05-21 MED ORDER — CHLORHEXIDINE GLUCONATE CLOTH 2 % EX PADS: 6.0000 | MEDICATED_PAD | Freq: Every day | CUTANEOUS | Status: DC

## 2018-05-21 MED ORDER — LIDOCAINE HCL (PF) 1 % IJ SOLN: 5.0000 mL | INTRAMUSCULAR | Status: DC | PRN

## 2018-05-21 MED ORDER — CLONIDINE HCL 0.2 MG PO TABS: 0.2000 mg | ORAL_TABLET | Freq: Three times a day (TID) | ORAL | 0 refills | Status: DC

## 2018-05-21 MED ORDER — HYDRALAZINE HCL 50 MG PO TABS: 50.0000 mg | ORAL_TABLET | Freq: Three times a day (TID) | ORAL | 0 refills | Status: DC

## 2018-05-21 MED ORDER — TACROLIMUS 0.5 MG PO CAPS: 1.5000 mg | ORAL_CAPSULE | Freq: Two times a day (BID) | ORAL | 0 refills | Status: DC

## 2018-05-21 MED ORDER — LIDOCAINE-PRILOCAINE 2.5-2.5 % EX CREA: 1.0000 "application " | TOPICAL_CREAM | CUTANEOUS | Status: DC | PRN

## 2018-05-21 NOTE — Progress Notes (Signed)
Iv line removed all scheduled meds for tonight given pt packed all his belongings and went home with it, drawers and closet checked in the present of patient  non of belongings left behind, went through discharge papers with him all questions answered, transported in a wheel chair to the emergency room parking lot

## 2018-05-21 NOTE — Progress Notes (Signed)
[facility] ROUNDING NOTE   Subjective:   Appears to be doing well no complaints.  AV graft revision left groin.  Incision site clean and dry.  Dialysis cath right groin  Blood pressure 128/57 pulse 83 temperature 98.2 O2 sats 100%  Sodium 136 potassium 3.8 chloride 97 CO2 23 BUN 66 creatinine 11.97 glucose 157 albumin 2.4 phosphorus 9.4 WBC 9.0 hemoglobin 8.1 platelets 208  Objective:  Vital signs in last 24 hours:  Temp:  [97.9 F (36.6 C)-99.5 F (37.5 C)] 99.2 F (37.3 C) (12/13 0608) Pulse Rate:  [76-89] 83 (12/13 0608) Resp:  [16-20] 20 (12/13 4270) BP: (100-151)/(52-76) 128/57 (12/13 0608) SpO2:  [99 %-100 %] 100 % (12/13 0608) Weight:  [149.7 kg-152.7 kg] 152.6 kg (12/13 0500)  Weight change:  Filed Weights   05/20/18 1158 05/20/18 1558 05/21/18 0500  Weight: (!) 152.7 kg (!) 149.7 kg (!) 152.6 kg    Intake/Output: I/O last 3 completed shifts: In: 966.9 [P.O.:600; I.V.:104.7; Blood:262.3] Out: 3000 [Other:3000]   Intake/Output this shift:  No intake/output data recorded.  CVS- RRR RS- CTA ABD- BS present soft non-distended EXT- no edema   right femoral hemodialysis catheter trace lower extremity edema   Basic Metabolic Panel: Recent Labs  Lab 05/16/18 0423 05/18/18 0456 05/18/18 1915 05/19/18 0524 05/20/18 1213  NA 135 137 135 136 136  K 4.5 4.4 4.6 4.7 3.8  CL 97* 97* 97* 98 97*  CO2 24 23 22 25 23   GLUCOSE 102* 94 115* 101* 157*  BUN 44* 77* 40* 51* 66*  CREATININE 9.42* 13.20* 7.83* 9.44* 11.97*  CALCIUM 8.7* 8.4* 8.6* 8.8* 8.5*  PHOS 7.6* 9.4* 6.4* 8.7* 9.4*    Liver Function Tests: Recent Labs  Lab 05/16/18 0423 05/18/18 0456 05/18/18 1915 05/19/18 0524 05/20/18 1213  ALBUMIN 2.5* 2.6* 2.8* 2.5* 2.4*   No results for input(s): LIPASE, AMYLASE in the last 168 hours. No results for input(s): AMMONIA in the last 168 hours.  CBC: Recent Labs  Lab 05/18/18 0456 05/18/18 1915 05/19/18 0524 05/20/18 1214  05/21/18 0410  WBC 8.4 14.1* 10.4 5.5 9.0  HGB 8.0* 8.5* 7.6* 6.7* 8.1*  HCT 27.7* 28.7* 25.9* 22.5* 26.5*  MCV 86.3 83.4 84.9 86.9 86.9  PLT 214 230 238 194 208    Cardiac Enzymes: No results for input(s): CKTOTAL, CKMB, CKMBINDEX, TROPONINI in the last 168 hours.  BNP: Invalid input(s): POCBNP  CBG: Recent Labs  Lab 05/19/18 2224 05/20/18 0830 05/20/18 1633 05/20/18 2203 05/21/18 0811  GLUCAP 130* 102* 94 141* 102*    Microbiology: Results for orders placed or performed during the hospital encounter of 05/10/18  Urine Culture     Status: None   Collection Time: 05/10/18  6:14 PM  Result Value Ref Range Status   Specimen Description URINE, CLEAN CATCH  Final   Special Requests NONE  Final   Culture   Final    NO GROWTH Performed at [facility], [place] [address], [place], [place] [address]    Report Status 05/11/2018 FINAL  Final  Culture, blood (Routine X 2) w Reflex to ID Panel     Status: None   Collection Time: 05/11/18 11:32 AM  Result Value Ref Range Status   Specimen Description BLOOD BLOOD LEFT FOREARM  Final   Special Requests   Final    BOTTLES DRAWN AEROBIC ONLY Blood Culture adequate volume   Culture   Final    NO GROWTH 5 DAYS Performed at [facility]  Lab, [address], [place], [place] [address]    Report Status 05/16/2018 FINAL  Final  Culture, blood (Routine X 2) w Reflex to ID Panel     Status: None   Collection Time: 05/11/18  6:24 PM  Result Value Ref Range Status   Specimen Description BLOOD LEFT ANTECUBITAL  Final   Special Requests   Final    BOTTLES DRAWN AEROBIC ONLY Blood Culture results may not be optimal due to an inadequate volume of blood received in culture bottles   Culture   Final    NO GROWTH 5 DAYS Performed at  Hospital Lab, [place] [address] [place], [place] [address]    Report Status 05/16/2018 FINAL  Final  MRSA PCR Screening     Status: None   Collection Time: 05/11/18  6:30 PM  Result Value Ref Range  Status   MRSA by PCR NEGATIVE NEGATIVE Final    Comment:        The GeneXpert MRSA Assay (FDA approved for NASAL specimens only), is one component of a comprehensive MRSA colonization surveillance program. It is not intended to diagnose MRSA infection nor to guide or monitor treatment for MRSA infections. Performed at [facility], [place] [address] [place], [place] [address]     Coagulation Studies: No results for input(s): LABPROT, INR in the last 72 hours.  Urinalysis: No results for input(s): COLORURINE, LABSPEC, PHURINE, GLUCOSEU, HGBUR, BILIRUBINUR, KETONESUR, PROTEINUR, UROBILINOGEN, NITRITE, LEUKOCYTESUR in the last 72 hours.  Invalid input(s): APPERANCEUR    Imaging: No results found.   Medications:   . sodium chloride Stopped (05/18/18 2208)  . sodium chloride Stopped (05/18/18 2208)  . sodium chloride    . sodium chloride     . sodium chloride   Intravenous Once  . sodium chloride   Intravenous Once  . acetaminophen  650 mg Oral Once  . aspirin EC  81 mg Oral Daily  . atorvastatin  20 mg Oral Daily  . calcitRIOL  0.5 mcg Oral Q T,Th,Sa-HD  . carvedilol  25 mg Oral BID  . chlorhexidine  15 mL Mouth Rinse BID  . Chlorhexidine Gluconate Cloth  6 each Topical Q0600  . Chlorhexidine Gluconate Cloth  6 each Topical Q0600  . Chlorhexidine Gluconate Cloth  6 each Topical Q0600  . cloNIDine  0.2 mg Oral TID  . darbepoetin (ARANESP) injection - DIALYSIS  100 mcg Intravenous Q Tue-HD  . diphenhydrAMINE  25 mg Intravenous Once  . furosemide  20 mg Intravenous Once  . heparin  5,000 Units Subcutaneous Q8H  . hydrALAZINE  50 mg Oral Q8H  . insulin aspart  0-9 Units Subcutaneous TID WC  . mouth rinse  15 mL Mouth Rinse q12n4p  . mycophenolate  1,000 mg Oral BID  . omega-3 acid ethyl esters  1 g Oral Daily  . predniSONE  5 mg Oral Q breakfast  . sodium chloride flush  3 mL Intravenous Q12H  . sodium chloride flush  3 mL Intravenous Q12H  . sucroferric  oxyhydroxide  500 mg Oral TID WC  . 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  Appreciate assistance from Dr. Servando Snare,  plan for AV loop graft right femoral and possible left femoral TDC.  Plan dialysis 05/21/2018 he is to be discharged after dialysis  DDRT with history of [facility]GF at [facility] patient using 5 mg of prednisone daily MMF 1 g twice daily tacrolimus 1.5 twice daily, recent decreased dose 05/16/2018.  Has been being evaluated and seen at [facility] and not considered a candidate for repeat biopsy of transplant kidney  Hypertension appears to be under better control.  Diabetes controlled  Secondary hyperparathyroidism PTH 324 started Rocaltrol 0.5 mg with hemodialysis    hyperphosphatemia increase Velphoro 1 g with meals  Hyperlipidemia continues on atorvastatin  Anemia transfused 05/12/2018   darbepoetin.  100 mcg administered 05/18/2018  Obstructive sleep apnea using CPAP  Stable for discharge dialysis [facility] TTS 5 AM   LOS: [place] @TODAY @10 :41 AM

## 2018-05-21 NOTE — Progress Notes (Signed)
HD tx completed @ 2058 w/o problem UF goal met Blood rinsed back VSS Report called to Bing Plume, RN

## 2018-05-21 NOTE — Discharge Summary (Signed)
PATIENT DETAILS Name: [name] Age: 42 y.o. Sex: male Date of Birth: [birth year] MRN: [id number]. Admitting Physician: Eugenie Filler, MD URK:[id number], Einar Pheasant, DO  Admit Date: 05/10/2018 Discharge date: 05/21/2018  Recommendations for Outpatient Follow-up:  1. Follow up with PCP in 1-2 weeks 2. Please obtain BMP/CBC in one week 3. Please ensure follow-up with nephrology, and outpatient HD center.  Admitted From:  Home  Disposition: [place]: No  Equipment/Devices: None  Discharge Condition: Stable  CODE STATUS: FULL CODE  Diet recommendation:  Heart Healthy / Carb Modified   Brief Summary: See H&P, Labs, Consult and Test reports for all details in brief, Patient is a 42 y.o. male with history of renal transplant on immunosuppressive's-presenting with worsening renal function and significant volume overload-now thought to have ESRD and started on hemodialysis.  See below for further details  Brief Hospital Course: AKI on CKD stage IV-now ESRD: Nephrology following and directing HD cath, outpatient HD Per nephrology-patient not a candidate for repeat biopsy of transplant kidney.  Vascular surgery placed left AV graft on thigh on 12/10.  Remains on Prograf (dose adjusted), prednisone and CellCept which will be managed by nephrology in the outpatient setting.  Has outpatient HD chair now arranged-dialysis schedule is TTS-spoke with Dr. Effie Berkshire will arrange for dialysis today-as outpatient center is not able to start dialysis over the weekend.  He will then follow at outpatient HD center on Tuesday.  Volume overload due to worsening renal function: Much improved after initiation of HD-appears euvolemic.   Hypertension: Controlled-continue hydralazine, clonidine and Coreg.  Followed adjust accordingly.    Anemia: Likely secondary to worsening renal function-been transfused 2 units of PRBC so far-hemoglobin currently stable.  Nephrology reduce  darbepoetin and IV iron in the outpatient setting.  Dyslipidemia: Continue statin  DM-2: CBGs stable with SSI.  Not on any oral hypoglycemics in the outpatient setting-most recent A1c is 5.8.    OSA: CPAP nightly  Morbid obesity  Procedures/Studies: 12/10>>Left femoral AV loop graft with 4-7 mm PTFE  Discharge Diagnoses:  Principal Problem:   Acute kidney injury superimposed on CKD ([place]) Active Problems:   OSA (obstructive sleep apnea)   Essential hypertension   Immunosuppression ([place])   Morbid obesity ([place])   Kidney replaced by transplant   Type 2 diabetes mellitus without complication, without long-term current use of insulin ([facility])   Hypertensive urgency   Acidosis   Volume overload   Anemia   Hypertensive emergency   Discharge Instructions:  Activity:  As tolerated    Discharge Instructions    Call MD for:  difficulty breathing, headache or visual disturbances   Complete by:  As directed    Call MD for:  persistant nausea and vomiting   Complete by:  As directed    Call MD for:  severe uncontrolled pain   Complete by:  As directed    Diet - low sodium heart healthy   Complete by:  As directed    Discharge instructions   Complete by:  As directed    Follow with Primary MD  Luetta Nutting, DO in 1 week  Follow at your hemodialysis center on Tuesdays, Thursdays and Saturdays for hemodialysis.  Please get a complete blood count and chemistry panel checked by your Primary MD at your next visit, and again as instructed by your Primary MD.  Get Medicines reviewed and adjusted: Please take all your medications with you for your next visit with your Primary MD  Laboratory/radiological  data: Please request your Primary MD to go over all hospital tests and procedure/radiological results at the follow up, please ask your Primary MD to get all Hospital records sent to his/her office.  In some cases, they will be blood work, cultures and biopsy results pending at  the time of your discharge. Please request that your primary care M.D. follows up on these results.  Also Note the following: If you experience worsening of your admission symptoms, develop shortness of breath, life threatening emergency, suicidal or homicidal thoughts you must seek medical attention immediately by calling 911 or calling your MD immediately  if symptoms less severe.  You must read complete instructions/literature along with all the possible adverse reactions/side effects for all the Medicines you take and that have been prescribed to you. Take any new Medicines after you have completely understood and accpet all the possible adverse reactions/side effects.   Do not drive when taking Pain medications or sleeping medications (Benzodaizepines)  Do not take more than prescribed Pain, Sleep and Anxiety Medications. It is not advisable to combine anxiety,sleep and pain medications without talking with your primary care practitioner  Special Instructions: If you have smoked or chewed Tobacco  in the last 2 yrs please stop smoking, stop any regular Alcohol  and or any Recreational drug use.  Wear Seat belts while driving.  Please note: You were cared for by a hospitalist during your hospital stay. Once you are discharged, your primary care physician will handle any further medical issues. Please note that NO REFILLS for any discharge medications will be authorized once you are discharged, as it is imperative that you return to your primary care physician (or establish a relationship with a primary care physician if you do not have one) for your post hospital discharge needs so that they can reassess your need for medications and monitor your lab values.   Increase activity slowly   Complete by:  As directed      Allergies as of 05/21/2018   No Known Allergies     Medication List    STOP taking these medications   amLODipine 10 MG tablet Commonly known as:  NORVASC     TAKE  these medications   acetaminophen 500 MG tablet Commonly known as:  TYLENOL Take 1,000 mg by mouth every 6 (six) hours as needed for mild pain or headache.   aspirin EC 81 MG tablet Take 81 mg by mouth daily.   atorvastatin 20 MG tablet Commonly known as:  LIPITOR Take 1 tablet (20 mg total) by mouth daily.   carvedilol 25 MG tablet Commonly known as:  COREG Take 1 tablet (25 mg total) by mouth 2 (two) times daily. What changed:  how much to take   cloNIDine 0.2 MG tablet Commonly known as:  CATAPRES Take 1 tablet (0.2 mg total) by mouth 3 (three) times daily.   Fish Oil 1000 MG Caps Take 1,000 mg by mouth daily.   hydrALAZINE 50 MG tablet Commonly known as:  APRESOLINE Take 1 tablet (50 mg total) by mouth every 8 (eight) hours.   HYDROcodone-acetaminophen 5-325 MG tablet Commonly known as:  NORCO Take 1 tablet by mouth every 6 (six) hours as needed for moderate pain.   mycophenolate 500 MG tablet Commonly known as:  CELLCEPT Take 1,000 mg by mouth 2 (two) times daily.   predniSONE 5 MG tablet Commonly known as:  DELTASONE Take 5 mg by mouth daily with breakfast.   tacrolimus 0.5 MG capsule Commonly  known as:  PROGRAF Take 3 capsules (1.5 mg total) by mouth 2 (two) times daily. What changed:    medication strength  how much to take  when to take this  additional instructions   Vitamin D3 50 MCG (2000 UT) capsule Take by mouth.      Follow-up Information    Luetta Nutting, DO. Schedule an appointment as soon as possible for a visit in 1 week(s).   Specialty:  Family Medicine Contact information: [name] [address] [phone]        Hemodialysis center Follow up.   Why:  On Tuesdays, Thursdays and Saturdays         No Known Allergies  Consultations:   urology  Other Procedures/Studies: Dg Chest 2 View  Result Date: 05/10/2018 CLINICAL DATA:  Generalized swelling. EXAM: CHEST - 2 VIEW COMPARISON:  December 13, 2010 FINDINGS: The heart size and mediastinal contours are within normal limits. Both lungs are clear. The visualized skeletal structures are unremarkable. IMPRESSION: No active cardiopulmonary disease. Electronically Signed   By: Dorise Bullion III M.D   On: 05/10/2018 16:16   US Renal Transplant W/doppler  Result Date: 05/10/2018 CLINICAL DATA:  Acute renal failure.  Renal transplant. EXAM: ULTRASOUND OF RENAL TRANSPLANT WITH RENAL DOPPLER ULTRASOUND TECHNIQUE: Ultrasound examination of the renal transplant was performed with gray-scale, color and duplex doppler evaluation. COMPARISON:  None. FINDINGS: Transplant kidney location: Right lower quadrant Transplant Kidney: Renal measurements: 12.6 x 4.8 x 3.8 cm = volume: 118.50m. Normal in size and parenchymal echogenicity. No evidence of mass or hydronephrosis. Color flow in the main renal artery:  Yes Color flow in the main renal vein:  Yes Duplex Doppler Evaluation: Main Renal Artery Resistive Index: 0.82 Venous waveform in main renal vein:  Present Intrarenal resistive index in upper pole:  0.79 (normal 0.6-0.8; equivocal 0.8-0.9; abnormal >= 0.9) Intrarenal resistive index in lower pole: 0.56 (normal 0.6-0.8; equivocal 0.8-0.9; abnormal >= 0.9) Bladder: Not visualized Other findings: There is a 7.1 x 5.6 x 4.8 complex fluid collection noted in the right lower quadrant adjacent to the lower pole of the transplant kidney. [facility] when this measured up to 8.6 cm. IMPRESSION: Right lower quadrant renal transplant. No acute findings. No hydronephrosis. 7.1 cm complex fluid collection in the right lower quadrant adjacent to the lower pole of the transplant kidney. This has decreased in size when compared to [facility]. Electronically Signed   By: KRolm BaptiseM.D.   On: 05/10/2018 21:47   Ir Fluoro Guide Cv Line Right  Result Date: 05/11/2018 INDICATION:  History of end-stage renal disease now with failed renal transplant. Patient with multiple previous tunneled dialysis catheters and concern for upper extremity central venous occlusion demonstrated on remote declot performed 02/17/2018 and as patient previously dialyzed via a right femoral approach dialysis catheter prior to receiving his renal transplant. EXAM: TUNNELED CENTRAL VENOUS HEMODIALYSIS CATHETER PLACEMENT WITH ULTRASOUND AND FLUOROSCOPIC GUIDANCE MEDICATIONS: Ancef 1 gm IV . The antibiotic was given in an appropriate time interval prior to skin puncture. ANESTHESIA/SEDATION: Versed 1.5 mg IV; Fentanyl 100 mcg IV; Moderate Sedation Time:  30 minutes The patient was continuously monitored during the procedure by the interventional radiology nurse under my direct supervision. FLUOROSCOPY TIME:  2 minutes, 24 seconds (41 mGy) COMPLICATIONS: None immediate. PROCEDURE: Informed written consent was obtained from the patient after a discussion of the risks, benefits,  and alternatives to treatment. Questions regarding the procedure were encouraged and answered. Given above, decision was made to proceed with a tunneled right common femoral approach dialysis catheter. Sonographic evaluation demonstrated persistent patency of the right common femoral vein. As such, the right groin and superolateral aspect the right thigh was prepped with chlorhexidine in a sterile fashion, and a sterile drape was applied covering the operative field. Maximum barrier sterile technique with sterile gowns and gloves were used for the procedure. A timeout was performed prior to the initiation of the procedure. After creating a small venotomy incision, a micropuncture kit was utilized to access the right common femoral vein. Real-time ultrasound guidance was utilized for vascular access including the acquisition of a permanent ultrasound image documenting patency of the accessed vessel. A stiff glidewire was utilized for measurement  purposes Next, the micropuncture sheath was exchanged for a peel-away sheath under fluoroscopic guidance. A asked split tunneled hemodialysis catheter measuring 55 cm from tip to cuff was tunneled in a retrograde fashion from the anterolateral aspect the right thigh to the venotomy incision. The catheter was then placed through the peel-away sheath with tips ultimately positioned within the perihepatic IVC. Given concern for slightly caudal positioning of the tips of the dialysis catheter, the decision was made to create a shorter length subcutaneous track. Note, despite measurement, patient has a large amount of redundant tissue about the thigh which resulted in suboptimal catheter tip positioning. As such, a more proximal location on the patient's anterolateral aspect right thigh was selected and utilized to tunnel a new 55 cm dialysis catheter to the venotomy incision. Existing dialysis catheter was retracted from its access site at the venotomy site. The peripheral aspect of the dialysis catheter was cut, the peripheral component was removed. A stiff glidewire was utilized to cannulate the remainder of the remaining portion of the existing dialysis catheter and advanced to the level of the inferior cavoatrial junction. Next, the remaining portion of the initial dialysis catheter was exchanged for a new the low a sheath under fluoroscopic guidance. Finally, the new 55 cm tip to cuff dialysis catheter was inserted through the peel-away sheath with tip ultimately terminating within the inferior cavoatrial junction. Final catheter positioning was confirmed and documented with a spot fluoroscopic images. The catheter aspirates and flushes normally. The catheter was flushed with appropriate volume heparin dwells. The catheter exit site was secured with a 0-Prolene retention suture. The venotomy incision was closed with Dermabond and Steri-strips. Dressings were applied. The patient tolerated the procedure well without  immediate post procedural complication. IMPRESSION: Successful placement of 55 cm tip to cuff tunneled hemodialysis catheter via the right common femoral vein vein with tips terminating within the inferior cavoatrial junction. The catheter is ready for immediate use. Electronically Signed   By: Sandi Mariscal M.D.   On: 05/11/2018 17:46   Ir US Guide Vasc Access Right  Result Date: 05/11/2018 INDICATION: History of end-stage renal disease now with failed renal transplant. Patient with multiple previous tunneled dialysis catheters and concern for upper extremity central venous occlusion demonstrated on remote declot performed 02/17/2018 and as patient previously dialyzed via a right femoral approach dialysis catheter prior to receiving his renal transplant. EXAM: TUNNELED CENTRAL VENOUS HEMODIALYSIS CATHETER PLACEMENT WITH ULTRASOUND AND FLUOROSCOPIC GUIDANCE MEDICATIONS: Ancef 1 gm IV . The antibiotic was given in an appropriate time interval prior to skin puncture. ANESTHESIA/SEDATION: Versed 1.5 mg IV; Fentanyl 100 mcg IV; Moderate Sedation Time:  30 minutes The patient was continuously monitored  during the procedure by the interventional radiology nurse under my direct supervision. FLUOROSCOPY TIME:  2 minutes, 24 seconds (41 mGy) COMPLICATIONS: None immediate. PROCEDURE: Informed written consent was obtained from the patient after a discussion of the risks, benefits, and alternatives to treatment. Questions regarding the procedure were encouraged and answered. Given above, decision was made to proceed with a tunneled right common femoral approach dialysis catheter. Sonographic evaluation demonstrated persistent patency of the right common femoral vein. As such, the right groin and superolateral aspect the right thigh was prepped with chlorhexidine in a sterile fashion, and a sterile drape was applied covering the operative field. Maximum barrier sterile technique with sterile gowns and gloves were used for the  procedure. A timeout was performed prior to the initiation of the procedure. After creating a small venotomy incision, a micropuncture kit was utilized to access the right common femoral vein. Real-time ultrasound guidance was utilized for vascular access including the acquisition of a permanent ultrasound image documenting patency of the accessed vessel. A stiff glidewire was utilized for measurement purposes Next, the micropuncture sheath was exchanged for a peel-away sheath under fluoroscopic guidance. A asked split tunneled hemodialysis catheter measuring 55 cm from tip to cuff was tunneled in a retrograde fashion from the anterolateral aspect the right thigh to the venotomy incision. The catheter was then placed through the peel-away sheath with tips ultimately positioned within the perihepatic IVC. Given concern for slightly caudal positioning of the tips of the dialysis catheter, the decision was made to create a shorter length subcutaneous track. Note, despite measurement, patient has a large amount of redundant tissue about the thigh which resulted in suboptimal catheter tip positioning. As such, a more proximal location on the patient's anterolateral aspect right thigh was selected and utilized to tunnel a new 55 cm dialysis catheter to the venotomy incision. Existing dialysis catheter was retracted from its access site at the venotomy site. The peripheral aspect of the dialysis catheter was cut, the peripheral component was removed. A stiff glidewire was utilized to cannulate the remainder of the remaining portion of the existing dialysis catheter and advanced to the level of the inferior cavoatrial junction. Next, the remaining portion of the initial dialysis catheter was exchanged for a new the low a sheath under fluoroscopic guidance. Finally, the new 55 cm tip to cuff dialysis catheter was inserted through the peel-away sheath with tip ultimately terminating within the inferior cavoatrial junction.  Final catheter positioning was confirmed and documented with a spot fluoroscopic images. The catheter aspirates and flushes normally. The catheter was flushed with appropriate volume heparin dwells. The catheter exit site was secured with a 0-Prolene retention suture. The venotomy incision was closed with Dermabond and Steri-strips. Dressings were applied. The patient tolerated the procedure well without immediate post procedural complication. IMPRESSION: Successful placement of 55 cm tip to cuff tunneled hemodialysis catheter via the right common femoral vein vein with tips terminating within the inferior cavoatrial junction. The catheter is ready for immediate use. Electronically Signed   By: Sandi Mariscal M.D.   On: 05/11/2018 17:46   Vas [place] Burnard Bunting With/wo Tbi  Result Date: 05/16/2018 LOWER EXTREMITY DOPPLER STUDY Indications: Pre Operative for Thigh dialysis graft.  Performing Technologist: Birdena Crandall, [place] RVS  Examination Guidelines: A complete evaluation includes at minimum, Doppler waveform signals and systolic blood pressure reading at the level of bilateral brachial, anterior tibial, and posterior tibial arteries, when vessel segments are accessible. Bilateral testing is considered an integral part of a complete examination.  Photoelectric Plethysmograph (PPG) waveforms and toe systolic pressure readings are included as required and additional duplex testing as needed. Limited examinations for reoccurring indications may be performed as noted.  ABI Findings: +--------+------------------+-----+---------+--------+ Right   Rt Pressure (mmHg)IndexWaveform Comment  +--------+------------------+-----+---------+--------+ [id number]                    triphasic         +--------+------------------+-----+---------+--------+ PTA     181               1.18 triphasic         +--------+------------------+-----+---------+--------+ DP      152               0.99 triphasic          +--------+------------------+-----+---------+--------+ +--------+------------------+-----+---------+-------+ Left    Lt Pressure (mmHg)IndexWaveform Comment +--------+------------------+-----+---------+-------+ [id number]                    triphasic        +--------+------------------+-----+---------+-------+ PTA     151               0.99 triphasic        +--------+------------------+-----+---------+-------+ DP      148               0.97 triphasic        +--------+------------------+-----+---------+-------+ +-------+-----------+-----------+------------+------------+ ABI/TBIToday's ABIToday's TBIPrevious ABIPrevious TBI +-------+-----------+-----------+------------+------------+ Right  1.18                                           +-------+-----------+-----------+------------+------------+ Left   0.99                                           +-------+-----------+-----------+------------+------------+  Summary: Right: Resting right ankle-brachial index is within normal range. No evidence of significant right lower extremity arterial disease. Left: Resting left ankle-brachial index is within normal range. No evidence of significant left lower extremity arterial disease.  *See table(s) above for measurements and observations.  Electronically signed by Harold Barban MD on 05/16/2018 at 2:45:17 PM.   Final    Vas [place] Upper Ext Vein Mapping (pre-op Avf)  Result Date: 05/12/2018 UPPER EXTREMITY VEIN MAPPING  Indications: Pre-access. Limitations: patient body habitus, hypersomnolence Performing Technologist: Maudry Mayhew MHA, RDMS, RVT, RDCS  Examination Guidelines: A complete evaluation includes B-mode imaging, spectral Doppler, color Doppler, and power Doppler as needed of all accessible portions of each vessel. Bilateral testing is considered an integral part of a complete examination. Limited examinations for reoccurring indications may be performed as noted.  +-----------------+-------------+----------+--------------+ Right Cephalic   Diameter (cm)Depth (cm)   Findings    +-----------------+-------------+----------+--------------+ Shoulder             0.24        1.46      thrombus    +-----------------+-------------+----------+--------------+ Prox upper arm       0.25        1.50      thrombus    +-----------------+-------------+----------+--------------+ Mid upper arm        0.37        1.17      thrombus    +-----------------+-------------+----------+--------------+ Dist upper arm       0.27  0.65      thrombus    +-----------------+-------------+----------+--------------+ Antecubital fossa                       not visualized +-----------------+-------------+----------+--------------+ Prox forearm                            not visualized +-----------------+-------------+----------+--------------+ Mid forearm                             not visualized +-----------------+-------------+----------+--------------+ Dist forearm                            not visualized +-----------------+-------------+----------+--------------+ Wrist                                   not visualized +-----------------+-------------+----------+--------------+ +-----------------+-------------+----------+--------------+ Right Basilic    Diameter (cm)Depth (cm)   Findings    +-----------------+-------------+----------+--------------+ Shoulder                                not visualized +-----------------+-------------+----------+--------------+ Prox upper arm                          not visualized +-----------------+-------------+----------+--------------+ Mid upper arm                           not visualized +-----------------+-------------+----------+--------------+ Dist upper arm                          not visualized +-----------------+-------------+----------+--------------+ Antecubital fossa                        not visualized +-----------------+-------------+----------+--------------+ Prox forearm                            not visualized +-----------------+-------------+----------+--------------+ Mid forearm                             not visualized +-----------------+-------------+----------+--------------+ Distal forearm                          not visualized +-----------------+-------------+----------+--------------+ Elbow                                   not visualized +-----------------+-------------+----------+--------------+ Wrist                                   not visualized +-----------------+-------------+----------+--------------+ Varicose veins seen in proximal upper arm +-----------------+-------------+----------+---------+ Left Cephalic    Diameter (cm)Depth (cm)Findings  +-----------------+-------------+----------+---------+ Shoulder             0.31        1.63             +-----------------+-------------+----------+---------+ Prox upper arm       0.36        1.07             +-----------------+-------------+----------+---------+  Mid upper arm        0.34        0.92             +-----------------+-------------+----------+---------+ Dist upper arm       0.21        0.64   branching +-----------------+-------------+----------+---------+ Antecubital fossa    0.55        0.66   thrombus  +-----------------+-------------+----------+---------+ Prox forearm         0.31        0.81             +-----------------+-------------+----------+---------+ Mid forearm          0.37        0.77             +-----------------+-------------+----------+---------+ Dist forearm         0.34        0.58   branching +-----------------+-------------+----------+---------+ Wrist                0.35        0.50             +-----------------+-------------+----------+---------+ +-----------------+-------------+----------+--------------+  Left Basilic     Diameter (cm)Depth (cm)   Findings    +-----------------+-------------+----------+--------------+ Shoulder                                not visualized +-----------------+-------------+----------+--------------+ Prox upper arm                          not visualized +-----------------+-------------+----------+--------------+ Mid upper arm                           not visualized +-----------------+-------------+----------+--------------+ Dist upper arm                          not visualized +-----------------+-------------+----------+--------------+ Antecubital fossa                       not visualized +-----------------+-------------+----------+--------------+ Prox forearm                            not visualized +-----------------+-------------+----------+--------------+ Mid forearm                             not visualized +-----------------+-------------+----------+--------------+ Distal forearm                          not visualized +-----------------+-------------+----------+--------------+ Elbow                                   not visualized +-----------------+-------------+----------+--------------+ Wrist                                   not visualized +-----------------+-------------+----------+--------------+ *See table(s) above for measurements and observations.  Diagnosing physician: Quay Burow MD Electronically signed by Quay Burow MD on 05/12/2018 at 4:49:44 PM.    Final      TODAY-DAY OF DISCHARGE:  Subjective:   [name] today has no headache,no chest abdominal pain,no new weakness tingling or numbness, feels much  better wants to go home today.   Objective:   Blood pressure (!) 128/57, pulse 83, temperature 99.2 F (37.3 C), temperature source Oral, resp. rate 20, height '6\' 2"'  (1.88 m), weight (!) 152.6 kg, SpO2 100 %.  Intake/Output Summary (Last 24 hours) at 05/21/2018 1117 Last data filed  at 05/20/2018 1558 Gross per 24 hour  Intake 262.25 ml  Output 3000 ml  Net -2737.75 ml   Filed Weights   05/20/18 1158 05/20/18 1558 05/21/18 0500  Weight: (!) 152.7 kg (!) 149.7 kg (!) 152.6 kg    Exam: Awake Alert, Oriented *3, No new F.N deficits, Normal affect [place].AT,PERRAL Supple Neck,No JVD, No cervical lymphadenopathy appriciated.  Symmetrical Chest wall movement, Good air movement bilaterally, CTAB RRR,No Gallops,Rubs or new Murmurs, No Parasternal Heave +ve B.Sounds, Abd Soft, Non tender, No organomegaly appriciated, No rebound -guarding or rigidity. No Cyanosis, Clubbing or edema, No new Rash or bruise   PERTINENT RADIOLOGIC STUDIES: Dg Chest 2 View  Result Date: 05/10/2018 CLINICAL DATA:  Generalized swelling. EXAM: CHEST - 2 VIEW COMPARISON:  December 13, 2010 FINDINGS: The heart size and mediastinal contours are within normal limits. Both lungs are clear. The visualized skeletal structures are unremarkable. IMPRESSION: No active cardiopulmonary disease. Electronically Signed   By: Dorise Bullion III M.D   On: 05/10/2018 16:16   US Renal Transplant W/doppler  Result Date: 05/10/2018 CLINICAL DATA:  Acute renal failure.  Renal transplant. EXAM: ULTRASOUND OF RENAL TRANSPLANT WITH RENAL DOPPLER ULTRASOUND TECHNIQUE: Ultrasound examination of the renal transplant was performed with gray-scale, color and duplex doppler evaluation. COMPARISON:  None. FINDINGS: Transplant kidney location: Right lower quadrant Transplant Kidney: Renal measurements: 12.6 x 4.8 x 3.8 cm = volume: 118.38m. Normal in size and parenchymal echogenicity. No evidence of mass or hydronephrosis. Color flow in the main renal artery:  Yes Color flow in the main renal vein:  Yes Duplex Doppler Evaluation: Main Renal Artery Resistive Index: 0.82 Venous waveform in main renal vein:  Present Intrarenal resistive index in upper pole:  0.79 (normal 0.6-0.8; equivocal 0.8-0.9; abnormal >= 0.9) Intrarenal resistive index in  lower pole: 0.56 (normal 0.6-0.8; equivocal 0.8-0.9; abnormal >= 0.9) Bladder: Not visualized Other findings: There is a 7.1 x 5.6 x 4.8 complex fluid collection noted in the right lower quadrant adjacent to the lower pole of the transplant kidney. [facility] when this measured up to 8.6 cm. IMPRESSION: Right lower quadrant renal transplant. No acute findings. No hydronephrosis. 7.1 cm complex fluid collection in the right lower quadrant adjacent to the lower pole of the transplant kidney. This has decreased in size [facility] Electronically Signed   By: KRolm BaptiseM.D.   On: 05/10/2018 21:47   Ir Fluoro Guide Cv Line Right  Result Date: 05/11/2018 INDICATION: History of end-stage renal disease now with failed renal transplant. Patient with multiple previous tunneled dialysis catheters and concern for upper extremity central venous occlusion demonstrated on remote declot performed 02/17/2018 and as patient previously dialyzed via a right femoral approach dialysis catheter prior to receiving his renal transplant. EXAM: TUNNELED CENTRAL VENOUS HEMODIALYSIS CATHETER PLACEMENT WITH ULTRASOUND AND FLUOROSCOPIC GUIDANCE MEDICATIONS: Ancef 1 gm IV . The antibiotic was given in an appropriate time interval prior to skin puncture. ANESTHESIA/SEDATION: Versed 1.5 mg IV; Fentanyl 100 mcg IV; Moderate Sedation Time:  30 minutes The patient was continuously monitored during the procedure by the interventional  radiology nurse under my direct supervision. FLUOROSCOPY TIME:  2 minutes, 24 seconds (41 mGy) COMPLICATIONS: None immediate. PROCEDURE: Informed written consent was obtained from the patient after a discussion of the risks, benefits, and alternatives to treatment. Questions regarding the procedure were encouraged and answered. Given above, decision was made to proceed with a tunneled right common  femoral approach dialysis catheter. Sonographic evaluation demonstrated persistent patency of the right common femoral vein. As such, the right groin and superolateral aspect the right thigh was prepped with chlorhexidine in a sterile fashion, and a sterile drape was applied covering the operative field. Maximum barrier sterile technique with sterile gowns and gloves were used for the procedure. A timeout was performed prior to the initiation of the procedure. After creating a small venotomy incision, a micropuncture kit was utilized to access the right common femoral vein. Real-time ultrasound guidance was utilized for vascular access including the acquisition of a permanent ultrasound image documenting patency of the accessed vessel. A stiff glidewire was utilized for measurement purposes Next, the micropuncture sheath was exchanged for a peel-away sheath under fluoroscopic guidance. A asked split tunneled hemodialysis catheter measuring 55 cm from tip to cuff was tunneled in a retrograde fashion from the anterolateral aspect the right thigh to the venotomy incision. The catheter was then placed through the peel-away sheath with tips ultimately positioned within the perihepatic IVC. Given concern for slightly caudal positioning of the tips of the dialysis catheter, the decision was made to create a shorter length subcutaneous track. Note, despite measurement, patient has a large amount of redundant tissue about the thigh which resulted in suboptimal catheter tip positioning. As such, a more proximal location on the patient's anterolateral aspect right thigh was selected and utilized to tunnel a new 55 cm dialysis catheter to the venotomy incision. Existing dialysis catheter was retracted from its access site at the venotomy site. The peripheral aspect of the dialysis catheter was cut, the peripheral component was removed. A stiff glidewire was utilized to cannulate the remainder of the remaining portion of the  existing dialysis catheter and advanced to the level of the inferior cavoatrial junction. Next, the remaining portion of the initial dialysis catheter was exchanged for a new the low a sheath under fluoroscopic guidance. Finally, the new 55 cm tip to cuff dialysis catheter was inserted through the peel-away sheath with tip ultimately terminating within the inferior cavoatrial junction. Final catheter positioning was confirmed and documented with a spot fluoroscopic images. The catheter aspirates and flushes normally. The catheter was flushed with appropriate volume heparin dwells. The catheter exit site was secured with a 0-Prolene retention suture. The venotomy incision was closed with Dermabond and Steri-strips. Dressings were applied. The patient tolerated the procedure well without immediate post procedural complication. IMPRESSION: Successful placement of 55 cm tip to cuff tunneled hemodialysis catheter via the right common femoral vein vein with tips terminating within the inferior cavoatrial junction. The catheter is ready for immediate use. Electronically Signed   By: Sandi Mariscal M.D.   On: 05/11/2018 17:46   Ir US Guide Vasc Access Right  Result Date: 05/11/2018 INDICATION: History of end-stage renal disease now with failed renal transplant. Patient with multiple previous tunneled dialysis catheters and concern for upper extremity central venous occlusion demonstrated on remote declot performed 02/17/2018 and as patient previously dialyzed via a right femoral approach dialysis catheter prior to receiving his renal transplant. EXAM: TUNNELED CENTRAL VENOUS HEMODIALYSIS CATHETER PLACEMENT WITH ULTRASOUND AND FLUOROSCOPIC GUIDANCE MEDICATIONS: Ancef 1 gm IV .  The antibiotic was given in an appropriate time interval prior to skin puncture. ANESTHESIA/SEDATION: Versed 1.5 mg IV; Fentanyl 100 mcg IV; Moderate Sedation Time:  30 minutes The patient was continuously monitored during the procedure by the  interventional radiology nurse under my direct supervision. FLUOROSCOPY TIME:  2 minutes, 24 seconds (41 mGy) COMPLICATIONS: None immediate. PROCEDURE: Informed written consent was obtained from the patient after a discussion of the risks, benefits, and alternatives to treatment. Questions regarding the procedure were encouraged and answered. Given above, decision was made to proceed with a tunneled right common femoral approach dialysis catheter. Sonographic evaluation demonstrated persistent patency of the right common femoral vein. As such, the right groin and superolateral aspect the right thigh was prepped with chlorhexidine in a sterile fashion, and a sterile drape was applied covering the operative field. Maximum barrier sterile technique with sterile gowns and gloves were used for the procedure. A timeout was performed prior to the initiation of the procedure. After creating a small venotomy incision, a micropuncture kit was utilized to access the right common femoral vein. Real-time ultrasound guidance was utilized for vascular access including the acquisition of a permanent ultrasound image documenting patency of the accessed vessel. A stiff glidewire was utilized for measurement purposes Next, the micropuncture sheath was exchanged for a peel-away sheath under fluoroscopic guidance. A asked split tunneled hemodialysis catheter measuring 55 cm from tip to cuff was tunneled in a retrograde fashion from the anterolateral aspect the right thigh to the venotomy incision. The catheter was then placed through the peel-away sheath with tips ultimately positioned within the perihepatic IVC. Given concern for slightly caudal positioning of the tips of the dialysis catheter, the decision was made to create a shorter length subcutaneous track. Note, despite measurement, patient has a large amount of redundant tissue about the thigh which resulted in suboptimal catheter tip positioning. As such, a more proximal  location on the patient's anterolateral aspect right thigh was selected and utilized to tunnel a new 55 cm dialysis catheter to the venotomy incision. Existing dialysis catheter was retracted from its access site at the venotomy site. The peripheral aspect of the dialysis catheter was cut, the peripheral component was removed. A stiff glidewire was utilized to cannulate the remainder of the remaining portion of the existing dialysis catheter and advanced to the level of the inferior cavoatrial junction. Next, the remaining portion of the initial dialysis catheter was exchanged for a new the low a sheath under fluoroscopic guidance. Finally, the new 55 cm tip to cuff dialysis catheter was inserted through the peel-away sheath with tip ultimately terminating within the inferior cavoatrial junction. Final catheter positioning was confirmed and documented with a spot fluoroscopic images. The catheter aspirates and flushes normally. The catheter was flushed with appropriate volume heparin dwells. The catheter exit site was secured with a 0-Prolene retention suture. The venotomy incision was closed with Dermabond and Steri-strips. Dressings were applied. The patient tolerated the procedure well without immediate post procedural complication. IMPRESSION: Successful placement of 55 cm tip to cuff tunneled hemodialysis catheter via the right common femoral vein vein with tips terminating within the inferior cavoatrial junction. The catheter is ready for immediate use. Electronically Signed   By: Sandi Mariscal M.D.   On: 05/11/2018 17:46   Vas [place] Burnard Bunting With/wo Tbi  Result Date: 05/16/2018 LOWER EXTREMITY DOPPLER STUDY Indications: Pre Operative for Thigh dialysis graft.  Performing Technologist: Birdena Crandall, [place] RVS  Examination Guidelines: A complete evaluation includes at minimum, Doppler waveform signals  and systolic blood pressure reading at the level of bilateral brachial, anterior tibial, and posterior tibial  arteries, when vessel segments are accessible. Bilateral testing is considered an integral part of a complete examination. Photoelectric Plethysmograph (PPG) waveforms and toe systolic pressure readings are included as required and additional duplex testing as needed. Limited examinations for reoccurring indications may be performed as noted.  ABI Findings: +--------+------------------+-----+---------+--------+ Right   Rt Pressure (mmHg)IndexWaveform Comment  +--------+------------------+-----+---------+--------+ [id number]                    triphasic         +--------+------------------+-----+---------+--------+ PTA     181               1.18 triphasic         +--------+------------------+-----+---------+--------+ DP      152               0.99 triphasic         +--------+------------------+-----+---------+--------+ +--------+------------------+-----+---------+-------+ Left    Lt Pressure (mmHg)IndexWaveform Comment +--------+------------------+-----+---------+-------+ [id number]                    triphasic        +--------+------------------+-----+---------+-------+ PTA     151               0.99 triphasic        +--------+------------------+-----+---------+-------+ DP      148               0.97 triphasic        +--------+------------------+-----+---------+-------+ +-------+-----------+-----------+------------+------------+ ABI/TBIToday's ABIToday's TBIPrevious ABIPrevious TBI +-------+-----------+-----------+------------+------------+ Right  1.18                                           +-------+-----------+-----------+------------+------------+ Left   0.99                                           +-------+-----------+-----------+------------+------------+  Summary: Right: Resting right ankle-brachial index is within normal range. No evidence of significant right lower extremity arterial disease. Left: Resting left ankle-brachial index is  within normal range. No evidence of significant left lower extremity arterial disease.  *See table(s) above for measurements and observations.  Electronically signed by Harold Barban MD on 05/16/2018 at 2:45:17 PM.   Final    Vas [place] Upper Ext Vein Mapping (pre-op Avf)  Result Date: 05/12/2018 UPPER EXTREMITY VEIN MAPPING  Indications: Pre-access. Limitations: patient body habitus, hypersomnolence Performing Technologist: Maudry Mayhew MHA, RDMS, RVT, RDCS  Examination Guidelines: A complete evaluation includes B-mode imaging, spectral Doppler, color Doppler, and power Doppler as needed of all accessible portions of each vessel. Bilateral testing is considered an integral part of a complete examination. Limited examinations for reoccurring indications may be performed as noted. +-----------------+-------------+----------+--------------+ Right Cephalic   Diameter (cm)Depth (cm)   Findings    +-----------------+-------------+----------+--------------+ Shoulder             0.24        1.46      thrombus    +-----------------+-------------+----------+--------------+ Prox upper arm       0.25        1.50      thrombus    +-----------------+-------------+----------+--------------+ Mid upper arm        0.37  1.17      thrombus    +-----------------+-------------+----------+--------------+ Dist upper arm       0.27        0.65      thrombus    +-----------------+-------------+----------+--------------+ Antecubital fossa                       not visualized +-----------------+-------------+----------+--------------+ Prox forearm                            not visualized +-----------------+-------------+----------+--------------+ Mid forearm                             not visualized +-----------------+-------------+----------+--------------+ Dist forearm                            not visualized +-----------------+-------------+----------+--------------+ Wrist                                    not visualized +-----------------+-------------+----------+--------------+ +-----------------+-------------+----------+--------------+ Right Basilic    Diameter (cm)Depth (cm)   Findings    +-----------------+-------------+----------+--------------+ Shoulder                                not visualized +-----------------+-------------+----------+--------------+ Prox upper arm                          not visualized +-----------------+-------------+----------+--------------+ Mid upper arm                           not visualized +-----------------+-------------+----------+--------------+ Dist upper arm                          not visualized +-----------------+-------------+----------+--------------+ Antecubital fossa                       not visualized +-----------------+-------------+----------+--------------+ Prox forearm                            not visualized +-----------------+-------------+----------+--------------+ Mid forearm                             not visualized +-----------------+-------------+----------+--------------+ Distal forearm                          not visualized +-----------------+-------------+----------+--------------+ Elbow                                   not visualized +-----------------+-------------+----------+--------------+ Wrist                                   not visualized +-----------------+-------------+----------+--------------+ Varicose veins seen in proximal upper arm +-----------------+-------------+----------+---------+ Left Cephalic    Diameter (cm)Depth (cm)Findings  +-----------------+-------------+----------+---------+ Shoulder             0.31        1.63             +-----------------+-------------+----------+---------+ Prox upper  arm       0.36        1.07             +-----------------+-------------+----------+---------+ Mid upper arm        0.34         0.92             +-----------------+-------------+----------+---------+ Dist upper arm       0.21        0.64   branching +-----------------+-------------+----------+---------+ Antecubital fossa    0.55        0.66   thrombus  +-----------------+-------------+----------+---------+ Prox forearm         0.31        0.81             +-----------------+-------------+----------+---------+ Mid forearm          0.37        0.77             +-----------------+-------------+----------+---------+ Dist forearm         0.34        0.58   branching +-----------------+-------------+----------+---------+ Wrist                0.35        0.50             +-----------------+-------------+----------+---------+ +-----------------+-------------+----------+--------------+ Left Basilic     Diameter (cm)Depth (cm)   Findings    +-----------------+-------------+----------+--------------+ Shoulder                                not visualized +-----------------+-------------+----------+--------------+ Prox upper arm                          not visualized +-----------------+-------------+----------+--------------+ Mid upper arm                           not visualized +-----------------+-------------+----------+--------------+ Dist upper arm                          not visualized +-----------------+-------------+----------+--------------+ Antecubital fossa                       not visualized +-----------------+-------------+----------+--------------+ Prox forearm                            not visualized +-----------------+-------------+----------+--------------+ Mid forearm                             not visualized +-----------------+-------------+----------+--------------+ Distal forearm                          not visualized +-----------------+-------------+----------+--------------+ Elbow                                   not visualized  +-----------------+-------------+----------+--------------+ Wrist                                   not visualized +-----------------+-------------+----------+--------------+ *See table(s) above for measurements and observations.  Diagnosing physician: Quay Burow MD Electronically signed by Quay Burow MD on 05/12/2018 at 4:49:44 PM.  Final      PERTINENT LAB RESULTS: CBC: Recent Labs    05/20/18 1214 05/21/18 0410  WBC 5.5 9.0  HGB 6.7* 8.1*  HCT 22.5* 26.5*  PLT 194 208   CMET CMP     Component Value Date/Time   NA 136 05/20/2018 1213   K 3.8 05/20/2018 1213   CL 97 (L) 05/20/2018 1213   CO2 23 05/20/2018 1213   GLUCOSE 157 (H) 05/20/2018 1213   BUN 66 (H) 05/20/2018 1213   CREATININE 11.97 (H) 05/20/2018 1213   CALCIUM 8.5 (L) 05/20/2018 1213   PROT 5.6 (L) 05/11/2018 0344   ALBUMIN 2.4 (L) 05/20/2018 1213   AST 8 (L) 05/11/2018 0344   ALT 11 05/11/2018 0344   ALKPHOS 36 (L) 05/11/2018 0344   BILITOT 0.6 05/11/2018 0344   GFRNONAA 5 (L) 05/20/2018 1213   GFRAA 5 (L) 05/20/2018 1213    GFR Estimated Creatinine Clearance: 12.4 mL/min (A) (by C-G formula based on SCr of 11.97 mg/dL (H)). No results for input(s): LIPASE, AMYLASE in the last 72 hours. No results for input(s): CKTOTAL, CKMB, CKMBINDEX, TROPONINI in the last 72 hours. Invalid input(s): POCBNP No results for input(s): DDIMER in the last 72 hours. No results for input(s): HGBA1C in the last 72 hours. No results for input(s): CHOL, HDL, LDLCALC, TRIG, CHOLHDL, LDLDIRECT in the last 72 hours. No results for input(s): TSH, T4TOTAL, T3FREE, THYROIDAB in the last 72 hours.  Invalid input(s): FREET3 No results for input(s): VITAMINB12, FOLATE, FERRITIN, TIBC, IRON, RETICCTPCT in the last 72 hours. Coags: No results for input(s): INR in the last 72 hours.  Invalid input(s): PT Microbiology: Recent Results (from the past 240 hour(s))  Culture, blood (Routine X 2) w Reflex to ID Panel     Status:  None   Collection Time: 05/11/18 11:32 AM  Result Value Ref Range Status   Specimen Description BLOOD BLOOD LEFT FOREARM  Final   Special Requests   Final    BOTTLES DRAWN AEROBIC ONLY Blood Culture adequate volume   Culture   Final    NO GROWTH 5 DAYS Performed at [facility], [address], [place], [place] [address]    Report Status 05/16/2018 FINAL  Final  Culture, blood (Routine X 2) w Reflex to ID Panel     Status: None   Collection Time: 05/11/18  6:24 PM  Result Value Ref Range Status   Specimen Description BLOOD LEFT ANTECUBITAL  Final   Special Requests   Final    BOTTLES DRAWN AEROBIC ONLY Blood Culture results may not be optimal due to an inadequate volume of blood received in culture bottles   Culture   Final    NO GROWTH 5 DAYS Performed at [facility], [place] [address], [place], [place] [address]    Report Status 05/16/2018 FINAL  Final  MRSA PCR Screening     Status: None   Collection Time: 05/11/18  6:30 PM  Result Value Ref Range Status   MRSA by PCR NEGATIVE NEGATIVE Final    Comment:        The GeneXpert MRSA Assay (FDA approved for NASAL specimens only), is one component of a comprehensive MRSA colonization surveillance program. It is not intended to diagnose MRSA infection nor to guide or monitor treatment for MRSA infections. Performed at [facility], [place] [address], [place], [place] [address]     FURTHER DISCHARGE INSTRUCTIONS:  Get Medicines reviewed and adjusted: Please take all  your medications with you for your next visit with your Primary MD  Laboratory/radiological data: Please request your Primary MD to go over all hospital tests and procedure/radiological results at the follow up, please ask your Primary MD to get all Hospital records sent to his/her office.  In some cases, they will be blood work, cultures and biopsy results pending at the time of your discharge. Please request that your primary care M.D. goes  through all the records of your hospital data and follows up on these results.  Also Note the following: If you experience worsening of your admission symptoms, develop shortness of breath, life threatening emergency, suicidal or homicidal thoughts you must seek medical attention immediately by calling 911 or calling your MD immediately  if symptoms less severe.  You must read complete instructions/literature along with all the possible adverse reactions/side effects for all the Medicines you take and that have been prescribed to you. Take any new Medicines after you have completely understood and accpet all the possible adverse reactions/side effects.   Do not drive when taking Pain medications or sleeping medications (Benzodaizepines)  Do not take more than prescribed Pain, Sleep and Anxiety Medications. It is not advisable to combine anxiety,sleep and pain medications without talking with your primary care practitioner  Special Instructions: If you have smoked or chewed Tobacco  in the last 2 yrs please stop smoking, stop any regular Alcohol  and or any Recreational drug use.  Wear Seat belts while driving.  Please note: You were cared for by a hospitalist during your hospital stay. Once you are discharged, your primary care physician will handle any further medical issues. Please note that NO REFILLS for any discharge medications will be authorized once you are discharged, as it is imperative that you return to your primary care physician (or establish a relationship with a primary care physician if you do not have one) for your post hospital discharge needs so that they can reassess your need for medications and monitor your lab values.  Total Time spent coordinating discharge including counseling, education and face to face time equals 35 minutes.  SignedOren Binet 05/21/2018 11:17 AM

## 2018-05-21 NOTE — Plan of Care (Signed)
Patient is a dialysis patient, making progress, no complication, decreased edema, compliance with care, no sign of infection, No acute distress noted.

## 2018-05-21 NOTE — Progress Notes (Signed)
Received pt from [facility], [place] HD tx initiated @ [phone] via HD cath w/o problem, per report Bilat ports: pull/push/flush equally w/o problem, per report VSS, per report Will continue to monitor while on HD tx

## 2018-05-22 NOTE — Progress Notes (Signed)
Discharged papers given to me in an envelop  from the day time nurse [place] said pt can be discharged after dialysis and she has already send patient prescription to his pharmacy

## 2018-05-24 DIAGNOSIS — I480 Paroxysmal atrial fibrillation: Secondary | ICD-10-CM | POA: Insufficient documentation

## 2018-05-24 DIAGNOSIS — E1129 Type 2 diabetes mellitus with other diabetic kidney complication: Secondary | ICD-10-CM | POA: Insufficient documentation

## 2018-05-24 HISTORY — DX: Type 2 diabetes mellitus with other diabetic kidney complication: E11.29

## 2018-05-26 LAB — IRON,TIBC AND FERRITIN PANEL
Ferritin: 1155
Iron: 46
TIBC: 202
UIBC: 156

## 2018-05-26 LAB — VITAMIN D 25 HYDROXY (VIT D DEFICIENCY, FRACTURES): Vit D, 25-Hydroxy: 21

## 2018-05-26 LAB — BASIC METABOLIC PANEL
Creatinine: 13.3 — AB (ref 0.6–1.3)
GLUCOSE: 116
Sodium: 137 (ref 137–147)

## 2018-05-26 LAB — CBC AND DIFFERENTIAL
HCT: 21 — AB (ref 41–53)
Hemoglobin: 7.9 — AB (ref 13.5–17.5)
WBC: 3

## 2018-05-31 ENCOUNTER — Ambulatory Visit: Payer: 59 | Admitting: Family Medicine

## 2018-05-31 ENCOUNTER — Encounter: Payer: Self-pay | Admitting: Family Medicine

## 2018-05-31 VITALS — BP 98/58 | HR 78 | Temp 98.8°F | Ht 74.0 in | Wt 335.0 lb

## 2018-05-31 DIAGNOSIS — N179 Acute kidney failure, unspecified: Secondary | ICD-10-CM

## 2018-05-31 DIAGNOSIS — D631 Anemia in chronic kidney disease: Secondary | ICD-10-CM

## 2018-05-31 DIAGNOSIS — I10 Essential (primary) hypertension: Secondary | ICD-10-CM | POA: Diagnosis not present

## 2018-05-31 DIAGNOSIS — N184 Chronic kidney disease, stage 4 (severe): Secondary | ICD-10-CM | POA: Diagnosis not present

## 2018-05-31 DIAGNOSIS — N189 Chronic kidney disease, unspecified: Secondary | ICD-10-CM

## 2018-05-31 NOTE — Assessment & Plan Note (Addendum)
-  AKI with renal failure now on HD likely due to graft rejection, doing fairly well with this.  -F/u with vascular tomorrow.  -Follows with nephrology.  Has not had recent f/u with [facility] transplant team.

## 2018-05-31 NOTE — Assessment & Plan Note (Signed)
-  Related to CKD, update CBC today.

## 2018-05-31 NOTE — Addendum Note (Signed)
Addended by: Lynnea Ferrier on: 05/31/2018 12:14 PM   Modules accepted: Orders

## 2018-05-31 NOTE — Progress Notes (Signed)
[name] - 42 y.o. male MRN [id number]  Date of birth: [birth year]  Subjective Chief Complaint  Patient presents with  . Hospitalization Follow-up    He has been having intermittent dizziness-and headaches    HPI [name] is a 42 y.o. male with history of HTN, CKD s/p transplant, hypothyroidism, PAF and anemia here today for hospital f/u.  He was recently hospitalized for AKI with renal failure and has re-started HD.  He has been compliant with HD.  He has f/u with vascular surgery tomorrow and continues to follow with nephrology.  He does feel like his BP has been too low as he is having dizziness and feels more fatigued since increase in coreg.  He denies syncope, shortness of breath, edema, palpitations.  ROS:  A comprehensive ROS was completed and negative except as noted per HPI  No Known Allergies  Past Medical History:  Diagnosis Date  . Anemia    ESRD  . CHF (congestive heart failure) ([place])   . Chronic kidney disease 06/07/2011   Hemodialysis-TTS Last hemo today 05/08/2011  . Diabetes mellitus without complication ([place])   . GERD (gastroesophageal reflux disease)   . Hypertension 111/29/2012   pt states he takes no HTN meds  . Hypothyroidism, secondary   . Morbid obesity ([place])   . Paroxysmal atrial fibrillation (H[facility])   . Sleep apnea 06/07/2011   Pt had sllep study done 2 weeks ago- Dr. Joellyn Quails.  Does not  have a CPAP at this time.    Past Surgical History:  Procedure Laterality Date  . ARTERIOVENOUS GRAFT PLACEMENT  08/09/10   Left Thigh Graft by Dr. [name]  . AV FISTULA PLACEMENT Right 05/18/2018   Procedure: INSERTION OF ARTERIOVENOUS (AV) GORE-TEX GRAFT Left THIGH;  Surgeon: Waynetta Sandy, MD;  Location: [facility];  Service: Vascular;  Laterality: Right;  . CAPD REMOVAL  05/08/2011   Procedure: CONTINUOUS AMBULATORY PERITONEAL DIALYSIS  (CAPD) CATHETER REMOVAL;  Surgeon: [name], MD;  [facility];  Service: General;   Laterality: N/A;  Removal of CAPD catheter, Dr. requests to go after 100  . INSERTION OF DIALYSIS CATHETER  10/05/10   Right Femoral Cath insertion by Dr. [name].  Pt ahas had several caths inserted.  . IR FLUORO GUIDE CV LINE RIGHT  05/11/2018  . IR US GUIDE VASC ACCESS RIGHT  05/11/2018  . KIDNEY TRANSPLANT  2014  . UPPER EXTREMITY ANGIOGRAPHY Bilateral 05/13/2018   Procedure: UPPER EXTREMITY ANGIOGRAPHY - bilarteral;  Surgeon: Marty Heck, MD;  Location: [facility];  Service: Cardiovascular;  Laterality: Bilateral;    Social History   Socioeconomic History  . Marital status: Single    Spouse name: Not on file  . Number of children: 0  . Years of education: Not on file  . Highest education level: Not on file  Occupational History    Employer: [employer]  . Financial resource strain: Not on file  . Food insecurity:    Worry: Not on file    Inability: Not on file  . Transportation needs:    Medical: Not on file    Non-medical: Not on file  Tobacco Use  . Smoking status: Never Smoker  . Smokeless tobacco: Never Used  Substance and Sexual Activity  . Alcohol use: No  . Drug use: Yes    Frequency: 5.0 times per week    Types: Marijuana  . Sexual activity: Not on file  Lifestyle  .  Physical activity:    Days per week: Not on file    Minutes per session: Not on file  . Stress: Not on file  Relationships  . Social connections:    Talks on phone: Not on file    Gets together: Not on file    Attends religious service: Not on file    Active member of club or organization: Not on file    Attends meetings of clubs or organizations: Not on file    Relationship status: Not on file  Other Topics Concern  . Not on file  Social History Narrative   Lives alone.     Family History  Problem Relation Age of Onset  . Diabetes Father   . Stroke Father   . Anesthesia problems Neg Hx     Health Maintenance  Topic Date Due  . PNEUMOCOCCAL  POLYSACCHARIDE VACCINE AGE 4-64 HIGH RISK  10/20/1976  . FOOT EXAM  10/20/1984  . OPHTHALMOLOGY EXAM  10/20/1984  . TETANUS/TDAP  10/20/1993  . INFLUENZA VACCINE  09/07/2018 (Originally 01/07/2018)  . HEMOGLOBIN A1C  11/10/2018  . HIV Screening  Completed    ----------------------------------------------------------------------------------------------------------------------------------------------------------------------------------------------------------------- Physical Exam BP (!) 98/58   Pulse 78   Temp 98.8 F (37.1 C) (Oral)   Ht 6\' 2"  (1.88 m)   Wt (!) 335 lb (152 kg)   SpO2 98%   BMI 43.01 kg/m   Physical Exam Constitutional:      Appearance: Normal appearance. He is obese.  HENT:     Head: Normocephalic and atraumatic.     Mouth/Throat:     Mouth: Mucous membranes are moist.  Eyes:     General: No scleral icterus. Neck:     Musculoskeletal: Normal range of motion and neck supple.  Cardiovascular:     Rate and Rhythm: Normal rate and regular rhythm.  Pulmonary:     Effort: Pulmonary effort is normal.     Breath sounds: Normal breath sounds.  Skin:    General: Skin is warm and dry.  Neurological:     General: No focal deficit present.     Mental Status: He is alert.  Psychiatric:        Mood and Affect: Mood normal.     ------------------------------------------------------------------------------------------------------------------------------------------------------------------------------------------------------------------- Assessment and Plan  Essential hypertension -BP is low today and he is having symptoms of hypotension -Reduce coreg to 12.5mg  bid -F/u in 6 weeks.   Acute kidney injury superimposed on CKD ([place]) -AKI with renal failure now on HD likely due to graft rejection, doing fairly well with this.  -F/u with vascular tomorrow.  -Follows with nephrology.  Has not had recent f/u with [facility] transplant team.   Anemia -Related to CKD, update  CBC today.

## 2018-05-31 NOTE — Assessment & Plan Note (Signed)
-  BP is low today and he is having symptoms of hypotension -Reduce coreg to 12.5mg  bid -F/u in 6 weeks.

## 2018-05-31 NOTE — Patient Instructions (Signed)
-  Decrease carvedilol to 1/2 tab twice per day. -If still feeling dizzy with reduction of carvedilol let me know  -We'll be in touch with lab results.

## 2018-06-04 ENCOUNTER — Ambulatory Visit: Payer: 59

## 2018-06-04 ENCOUNTER — Ambulatory Visit (INDEPENDENT_AMBULATORY_CARE_PROVIDER_SITE_OTHER): Payer: Self-pay | Admitting: Physician Assistant

## 2018-06-04 ENCOUNTER — Other Ambulatory Visit: Payer: Self-pay

## 2018-06-04 DIAGNOSIS — Z992 Dependence on renal dialysis: Secondary | ICD-10-CM

## 2018-06-04 DIAGNOSIS — N186 End stage renal disease: Secondary | ICD-10-CM | POA: Insufficient documentation

## 2018-06-04 NOTE — Progress Notes (Signed)
    Postoperative Access Visit   History of Present Illness   [name] is a 42 y.o. year old male who presents for postoperative follow-up for: left thigh arteriovenous graft by Dr. Donzetta Matters 05/18/18.  The patient's wounds are healed.  The patient denies claudication or rest pain L foot.  The patient is able to complete their activities of daily living.  He is currently dialyzing with R femoral TDC on a TTS schedule.  Recent venogram in hospital demonstrated bilateral central venous occlusions.    Physical Examination   Vitals:   06/04/18 1401  BP: 108/66  Pulse: 73  Resp: 20  Temp: 99.2 F (37.3 C)  TempSrc: Oral  SpO2: 97%  Weight: (!) 340 lb 13.3 oz (154.6 kg)  Height: 6\' 2"  (1.88 m)   Body mass index is 43.76 kg/m.  left arm Incisions are healed, palpable L PTA pulse, palpable thrill and audible bruit throughout L thigh AV graft    Medical Decision Making   [name] is a 42 y.o. year old male who presents s/p left thigh arteriovenous graft   Patent L thigh AV graft  This will be ready for use 06/15/17  R femoral TDC can be removed when Nephrology is comfortable with the performance of L thigh AVG  Follow up on prn basis   Robert Ligas PA-C [facility] Office: [phone]

## 2018-06-16 ENCOUNTER — Encounter (INDEPENDENT_AMBULATORY_CARE_PROVIDER_SITE_OTHER): Payer: 59 | Admitting: Vascular Surgery

## 2018-06-29 ENCOUNTER — Encounter: Payer: Self-pay | Admitting: End-Stage Renal Disease (ESRD) Treatment

## 2018-08-13 ENCOUNTER — Encounter: Payer: Self-pay | Admitting: Family Medicine

## 2018-08-13 ENCOUNTER — Ambulatory Visit: Payer: 59 | Admitting: Family Medicine

## 2018-08-13 VITALS — BP 114/60 | HR 94 | Temp 99.1°F | Ht 74.0 in | Wt 336.2 lb

## 2018-08-13 DIAGNOSIS — G5711 Meralgia paresthetica, right lower limb: Secondary | ICD-10-CM | POA: Diagnosis not present

## 2018-08-13 MED ORDER — PREDNISONE 10 MG (21) PO TBPK: ORAL_TABLET | ORAL | 0 refills | Status: DC

## 2018-08-13 NOTE — Progress Notes (Signed)
Acute Office Visit  Subjective:    Patient ID: [name], male    DOB: 1976-01-01, 43 y.o.   MRN: [id number]  Chief Complaint  Patient presents with  . Leg Pain    Patient is here today C/O right upper thigh pain. He shows me the entire right vastus lateralus.  Sx started approximately 12-month-ago. He describes as stabbing pain that is intermittent but severe in nature.  Positive for edema in right knee but temp increase in thigh.  Denies any color change. Denies any injury.    Leg Pain   Associated symptoms include tingling.   Patient is in today for evaluation and treatment of paresthesias he is been experiencing in his right anterior lateral thigh over the last month or so.  There is been no injury.  He has tried no medicines for this.  Significant past medical history of the rejection of a trans-planted kidney from 2014.  He is back on dialysis.  Continues on Prograf and low-dose prednisone.  He works for [employer] in [place].  Patient denies headaches postnasal drip facial pressure teeth pain cough myalgias or arthralgias.  Past Medical History:  Diagnosis Date  . Anemia    ESRD  . CHF (congestive heart failure) ([place])   . Chronic kidney disease 06/07/2011   Hemodialysis-TTS Last hemo today 05/08/2011  . Diabetes mellitus without complication ([place])   . GERD (gastroesophageal reflux disease)   . Hypertension 111/29/2012   pt states he takes no HTN meds  . Hypothyroidism, secondary   . Morbid obesity ([place])   . Paroxysmal atrial fibrillation (H[facility])   . Sleep apnea 06/07/2011   Pt had sllep study done 2 weeks ago- Dr. Joellyn Quails.  Does not  have a CPAP at this time.    Past Surgical History:  Procedure Laterality Date  . ARTERIOVENOUS GRAFT PLACEMENT  08/09/10   Left Thigh Graft by Dr. [name]  . AV FISTULA PLACEMENT Right 05/18/2018   Procedure: INSERTION OF ARTERIOVENOUS (AV) GORE-TEX GRAFT Left THIGH;  Surgeon: Waynetta Sandy, MD;  Location: [facility];  Service: Vascular;  Laterality: Right;  . CAPD REMOVAL  05/08/2011   Procedure: CONTINUOUS AMBULATORY PERITONEAL DIALYSIS  (CAPD) CATHETER REMOVAL;  Surgeon: [name], MD;  [facility];  Service: General;  Laterality: N/A;  Removal of CAPD catheter, Dr. requests to go after 100  . INSERTION OF DIALYSIS CATHETER  10/05/10   Right Femoral Cath insertion by Dr. [name].  Pt ahas had several caths inserted.  . IR FLUORO GUIDE CV LINE RIGHT  05/11/2018  . IR US GUIDE VASC ACCESS RIGHT  05/11/2018  . KIDNEY TRANSPLANT  2014  . UPPER EXTREMITY ANGIOGRAPHY Bilateral 05/13/2018   Procedure: UPPER EXTREMITY ANGIOGRAPHY - bilarteral;  Surgeon: Marty Heck, MD;  Location: [facility];  Service: Cardiovascular;  Laterality: Bilateral;    Family History  Problem Relation Age of Onset  . Diabetes Father   . Stroke Father   . Anesthesia problems Neg Hx     Social History   Socioeconomic History  . Marital status: Single    Spouse name: Not on file  . Number of children: 0  . Years of education: Not on file  . Highest education level: Not on file  Occupational History    Employer: [employer]  . Financial resource strain: Not on file  . Food insecurity:    Worry: Not on file    Inability: Not  on file  . Transportation needs:    Medical: Not on file    Non-medical: Not on file  Tobacco Use  . Smoking status: Never Smoker  . Smokeless tobacco: Never Used  Substance and Sexual Activity  . Alcohol use: No  . Drug use: Yes    Frequency: 5.0 times per week    Types: Marijuana  . Sexual activity: Not on file  Lifestyle  . Physical activity:    Days per week: Not on file    Minutes per session: Not on file  . Stress: Not on file  Relationships  . Social connections:    Talks on phone: Not on file    Gets together: Not on file    Attends religious service: Not on file    Active member of club or organization: Not on file    Attends meetings of  clubs or organizations: Not on file    Relationship status: Not on file  . Intimate partner violence:    Fear of current or ex partner: Not on file    Emotionally abused: Not on file    Physically abused: Not on file    Forced sexual activity: Not on file  Other Topics Concern  . Not on file  Social History Narrative   Lives alone.     Outpatient Medications Prior to Visit  Medication Sig Dispense Refill  . acetaminophen (TYLENOL) 500 MG tablet Take 1,000 mg by mouth every 6 (six) hours as needed for mild pain or headache.    . Cholecalciferol (VITAMIN D3) 2000 units capsule Take by mouth.    . Omega-3 Fatty Acids (FISH OIL) 1000 MG CAPS Take 1,000 mg by mouth daily.    . predniSONE (DELTASONE) 5 MG tablet Take 5 mg by mouth daily with breakfast.     . tacrolimus (PROGRAF) 0.5 MG capsule Take 3 capsules (1.5 mg total) by mouth 2 (two) times daily. (Patient taking differently: Take 1 mg by mouth 2 (two) times daily. ) 180 capsule 0  . atorvastatin (LIPITOR) 20 MG tablet Take 1 tablet (20 mg total) by mouth daily. (Patient not taking: Reported on 08/13/2018) 90 tablet 1  . aspirin EC 81 MG tablet Take 81 mg by mouth daily.    . carvedilol (COREG) 25 MG tablet Take 1 tablet (25 mg total) by mouth 2 (two) times daily. 60 tablet 0  . cloNIDine (CATAPRES) 0.2 MG tablet Take 1 tablet (0.2 mg total) by mouth 3 (three) times daily. (Patient taking differently: Take 0.1 mg by mouth 3 (three) times daily. ) 90 tablet 0  . HYDROcodone-acetaminophen (NORCO) 5-325 MG tablet Take 1 tablet by mouth every 6 (six) hours as needed for moderate pain. 10 tablet 0  . mycophenolate (CELLCEPT) 500 MG tablet Take 1,000 mg by mouth 2 (two) times daily.      No facility-administered medications prior to visit.     No Known Allergies  Review of Systems  Constitutional: Negative for chills, fever, malaise/fatigue and weight loss.  HENT: Negative.   Eyes: Negative.   Respiratory: Negative.   Cardiovascular:  Negative.   Gastrointestinal: Negative.   Musculoskeletal: Negative for myalgias.  Skin: Negative.   Neurological: Positive for tingling. Negative for headaches.  Psychiatric/Behavioral: Negative.        Objective:    Physical Exam  Constitutional: He is oriented to person, place, and time. He appears well-developed. No distress.  HENT:  Head: Normocephalic and atraumatic.  Right Ear: External ear normal.  Left  Ear: External ear normal.  Eyes: Right eye exhibits no discharge. Left eye exhibits no discharge. No scleral icterus.  Neck: No JVD present. No tracheal deviation present.  Pulmonary/Chest: Effort normal. No stridor.  Musculoskeletal:     Lumbar back: He exhibits normal range of motion, no tenderness and no bony tenderness.       Legs:  Neurological: He is alert and oriented to person, place, and time. A sensory deficit is present.  Skin: Skin is warm and dry. He is not diaphoretic.  Psychiatric: He has a normal mood and affect. His behavior is normal.    BP 114/60 (BP Location: Left Arm, Patient Position: Sitting, Cuff Size: Normal)   Pulse 94   Temp 99.1 F (37.3 C) (Oral)   Ht 6\' 2"  (1.88 m)   Wt (!) 336 lb 3.2 oz (152.5 kg)   SpO2 96%   BMI 43.17 kg/m  Wt Readings from Last 3 Encounters:  08/13/18 (!) 336 lb 3.2 oz (152.5 kg)  06/04/18 (!) 340 lb 13.3 oz (154.6 kg)  05/31/18 (!) 335 lb (152 kg)    There are no preventive care reminders to display for this patient.  There are no preventive care reminders to display for this patient.   Lab Results  Component Value Date   TSH 2.619 05/11/2018   Lab Results  Component Value Date   WBC 3.0 05/26/2018   HGB 7.9 (A) 05/26/2018   HCT 21 (A) 05/26/2018   MCV 86.9 05/21/2018   PLT 208 05/21/2018   Lab Results  Component Value Date   NA 137 05/26/2018   K 4.2 05/21/2018   CO2 25 05/21/2018   GLUCOSE 107 (H) 05/21/2018   BUN 48 (H) 05/21/2018   CREATININE 13.3 (A) 05/26/2018   BILITOT 0.6 05/11/2018    ALKPHOS 36 (L) 05/11/2018   AST 8 (L) 05/11/2018   ALT 11 05/11/2018   PROT 5.6 (L) 05/11/2018   ALBUMIN 2.5 (L) 05/21/2018   CALCIUM 8.6 (L) 05/21/2018   ANIONGAP 13 05/21/2018   GFR 22.65 (L) 01/25/2018   Lab Results  Component Value Date   CHOL 266 (H) 05/12/2018   Lab Results  Component Value Date   HDL 45 05/12/2018   Lab Results  Component Value Date   LDLCALC 189 (H) 05/12/2018   Lab Results  Component Value Date   TRIG 159 (H) 05/12/2018   Lab Results  Component Value Date   CHOLHDL 5.9 05/12/2018   Lab Results  Component Value Date   HGBA1C 5.8 (H) 05/11/2018       Assessment & Plan:   Problem List Items Addressed This Visit      Nervous and Auditory   Meralgia paresthetica of right side - Primary   Relevant Medications   predniSONE (STERAPRED UNI-PAK 21 TAB) 10 MG (21) TBPK tablet   Other Relevant Orders   Ambulatory referral to Sports Medicine       Meds ordered this encounter  Medications  . predniSONE (STERAPRED UNI-PAK 21 TAB) 10 MG (21) TBPK tablet    Sig: Take 6 today, 5 tomorrow, 4 the next day and then 3, 2, 1 and stop    Dispense:  21 tablet    Refill:  0   Discussed the self-limited nature of this process.  Patient was advised to lose weight as he can and avoid tight fitting clothing.  Due to the length of symptoms we will ask for a sports medicine referral for additional insight.  Patient will  start a 6-day taper today and then continue his 5 mg 7 daily.  Libby Maw, MD

## 2018-08-30 ENCOUNTER — Telehealth: Payer: Self-pay

## 2018-08-30 NOTE — Telephone Encounter (Signed)
Questions for Screening COVID-19  Symptom onset:n/a  Travel or Contacts: No travel, no contact with anyone with covid-19 that he's aware  During this illness, did/does the patient experience any of the following symptoms? Fever >100.20F []   Yes [x]   No []   Unknown Subjective fever (felt feverish) []   Yes [x]   No []   Unknown Chills []   Yes [x]   No []   Unknown Muscle aches (myalgia) []   Yes [x]   No []   Unknown Runny nose (rhinorrhea) []   Yes [x]   No []   Unknown Sore throat []   Yes [x]   No []   Unknown Cough (new onset or worsening of chronic cough) []   Yes [x]   No []   Unknown Shortness of breath (dyspnea) []   Yes [x]   No []   Unknown Nausea or vomiting []   Yes [x]   No []   Unknown Headache []   Yes [x]   No []   Unknown Abdominal pain  []   Yes [x]   No []   Unknown Diarrhea (?3 loose/looser than normal stools/24hr period) []   Yes [x]   No []   Unknown Other, specify:  Patient risk factors: Smoker? []   Current []   Former [x]   Never If male, currently pregnant? []   Yes []   No  Patient Active Problem List   Diagnosis Date Noted  . Meralgia paresthetica of right side 08/13/2018  . ESRD on dialysis ([place]) 06/04/2018  . Acute kidney injury superimposed on CKD ([place]) 05/10/2018  . Acidosis 05/10/2018  . Volume overload 05/10/2018  . Anemia 05/10/2018  . Acute renal failure superimposed on stage 5 chronic kidney disease, not on chronic dialysis ([place])   . Type 2 diabetes mellitus without complication, without long-term current use of insulin ([place]) 01/25/2018  . Essential hypertension 01/25/2017  . Well adult exam 01/17/2016  . Morbid obesity ([place]) 01/17/2016  . Delayed graft function of kidney transplant due to reason other than ATN or rejection requiring acute dialysis ([place]) 11/02/2012  . Immunosuppression ([place]) 11/02/2012  . Kidney replaced by transplant 11/02/2012  . OSA (obstructive sleep apnea) 05/26/2011    Plan:  []   High risk for COVID-19 with red flags go to ED (with CP, SOB,  weak/lightheaded, or fever > 101.5). Call ahead.  []   High risk for COVID-19 but stable will have car visit. Inform provider and coordinate time. Will be completed in afternoon. *Consider myChart, telephone, or WebEx visit if available at site. []   No red flags but URI signs or symptoms will go through side door and be seen in dedicated room.  Note: Referral to telemedicine is an appropriate alternative disposition for higher risk but stable. [facility]/e-Visit: [phone].

## 2018-08-31 ENCOUNTER — Telehealth (INDEPENDENT_AMBULATORY_CARE_PROVIDER_SITE_OTHER): Payer: 59 | Admitting: Family Medicine

## 2018-08-31 ENCOUNTER — Other Ambulatory Visit: Payer: Self-pay

## 2018-08-31 DIAGNOSIS — G5711 Meralgia paresthetica, right lower limb: Secondary | ICD-10-CM

## 2018-08-31 MED ORDER — DICLOFENAC SODIUM 2 % TD SOLN: 1.0000 "application " | Freq: Two times a day (BID) | TRANSDERMAL | 3 refills | Status: DC

## 2018-08-31 NOTE — Progress Notes (Signed)
Virtual Visit via Telephone Note  I connected with [name] on 08/31/18 at  1:00 PM EDT by telephone and verified that I am speaking with the correct person using two identifiers.   I discussed the limitations, risks, security and privacy concerns of performing an evaluation and management service by telephone and the availability of in person appointments. I also discussed with the patient that there may be a patient responsible charge related to this service. The patient expressed understanding and agreed to proceed.   History of Present Illness:  Patient is at home.  I am calling from the office.  He was referred by Dr. Abelino Derrick.  He is presenting with right lateral upper thigh pain and altered sensation.  The symptoms been ongoing for 1 month.  He had some improvement with the prednisone.  He denies any significant physical activity at work, such as bending over.  Denies wearing tight clothing or a tight belt.  Feels like his symptoms are staying the same.  He cannot associated with any specific time of day.  No history of surgery in the area.  Does not go below the knee.  Does not wrap around posteriorly.  Symptoms are moderate to severe.   Observations/Objective:  No changes in strength. No reported mechanical symptoms. No deficiencies  Assessment and Plan:  Symptoms suggest mastalgia paresthetica.  Less likely that the joint be involved.  Does not appear to be any form of sciatica.  His weight could be contributing. - pennsaid. Counseled to contact his nephrologist before using. -Counseled on loosefitting clothing. -If no improvement may need to consider an injection of this area.  Could consider further imaging when available.  Follow Up Instructions:    I discussed the assessment and treatment plan with the patient. The patient was provided an opportunity to ask questions and all were answered. The patient agreed with the plan and demonstrated an understanding of  the instructions.   The patient was advised to call back or seek an in-person evaluation if the symptoms worsen or if the condition fails to improve as anticipated.  I provided 12 minutes of non-face-to-face time during this encounter.   Clearance Coots, MD

## 2018-08-31 NOTE — Assessment & Plan Note (Signed)
Seems to be consistent with his symptoms that his reporting - pennsaid.  Advised to consult with his nephrologist before taking. -Counseled on loosefitting clothing. -If no improvement can consider injection.

## 2018-11-03 ENCOUNTER — Encounter: Payer: Self-pay | Admitting: Family Medicine

## 2018-11-03 ENCOUNTER — Ambulatory Visit (INDEPENDENT_AMBULATORY_CARE_PROVIDER_SITE_OTHER): Payer: 59 | Admitting: Family Medicine

## 2018-11-03 ENCOUNTER — Other Ambulatory Visit: Payer: 59

## 2018-11-03 ENCOUNTER — Other Ambulatory Visit (HOSPITAL_COMMUNITY)
Admission: RE | Admit: 2018-11-03 | Discharge: 2018-11-03 | Disposition: A | Discharge status: Home / Self Care | Payer: 59 | Source: Ambulatory Visit | Attending: Family Medicine | Admitting: Family Medicine

## 2018-11-03 VITALS — BP 156/110 | HR 94 | Temp 98.2°F | Ht 74.0 in | Wt 325.6 lb

## 2018-11-03 DIAGNOSIS — R319 Hematuria, unspecified: Secondary | ICD-10-CM | POA: Insufficient documentation

## 2018-11-03 DIAGNOSIS — A630 Anogenital (venereal) warts: Secondary | ICD-10-CM | POA: Diagnosis not present

## 2018-11-03 LAB — POCT URINALYSIS DIPSTICK
Bilirubin, UA: NEGATIVE
Glucose, UA: NEGATIVE
Ketones, UA: NEGATIVE
Nitrite, UA: NEGATIVE
Protein, UA: POSITIVE — AB
Spec Grav, UA: 1.01 (ref 1.010–1.025)
Urobilinogen, UA: 0.2 E.U./dL
pH, UA: 8.5 — AB (ref 5.0–8.0)

## 2018-11-03 MED ORDER — IMIQUIMOD 5 % EX CREA: TOPICAL_CREAM | CUTANEOUS | 1 refills | Status: DC

## 2018-11-03 NOTE — Progress Notes (Signed)
Please add urine culture, orders entered.

## 2018-11-03 NOTE — Assessment & Plan Note (Signed)
-  Consistent with condyloma -Discussed cryo treatment vs imiquimod, would prefer to try imiquimod.  -Not currently on immunosuppressants, reviewed side effects.

## 2018-11-03 NOTE — Progress Notes (Signed)
[name] - 43 y.o. male MRN [id number]  Date of birth: 25-Jul-1975   This visit type was conducted due to national recommendations for restrictions regarding the COVID-19 Pandemic (e.g. social distancing).  This format is felt to be most appropriate for this patient at this time.  All issues noted in this document were discussed and addressed.  No physical exam was performed (except for noted visual exam findings with Video Visits).  I discussed the limitations of evaluation and management by telemedicine and the availability of in person appointments. The patient expressed understanding and agreed to proceed.  I connected with@ on 11/03/18 at 11:30 AM EDT by a video enabled telemedicine application and verified that I am speaking with the correct person using two identifiers.   Patient Location: [place] [address]   Provider location:   [facility]  Chief Complaint  Patient presents with  . urinary bleeding    urinary bleeding onset 1 wk,dark brown color, no discharge , gentinal warts-New - raise on inner thigh,skin intact,bumps on anus - onset: 4 wks    HPI  [name] is a 43 y.o. male who presents via [employer], civil (consulting) for a telehealth visit today.  Has complaint of genital warts as well as blood in urine.  He has history of CKD s/p renal transplant which unfortunately has failed.  He is now back on HD and only makes a small amount of urine each day.  Recently has noticed bright red blood in urine.  Denies pain with urination, penile discharge, flank pain, frequency, fever or chills. He could have possible had STD exposure.   He also reports several raised bumps on inner thigh and inner buttock area, that appear wart like.  He denies pain, swelling or bleeding of these areas.    ROS:  A comprehensive ROS was completed and negative except as noted per HPI  Past Medical History:  Diagnosis Date  . Anemia    ESRD  . CHF  (congestive heart failure) ([facility])   . Chronic kidney disease 06/07/2011   Hemodialysis-TTS Last hemo today 05/08/2011  . Diabetes mellitus without complication ([facility])   . GERD (gastroesophageal reflux disease)   . Hypertension 111/29/2012   pt states he takes no HTN meds  . Hypothyroidism, secondary   . Morbid obesity ([facility])   . Paroxysmal atrial fibrillation ([facility])   . Sleep apnea 06/07/2011   Pt had sllep study done 2 weeks ago- Dr. Joellyn Quails.  Does not  have a CPAP at this time.    Past Surgical History:  Procedure Laterality Date  . ARTERIOVENOUS GRAFT PLACEMENT  08/09/10   Left Thigh Graft by Dr. [name]  . AV FISTULA PLACEMENT Right 05/18/2018   Procedure: INSERTION OF ARTERIOVENOUS (AV) GORE-TEX GRAFT Left THIGH;  Surgeon: Waynetta Sandy, MD;  Location: [facility];  Service: Vascular;  Laterality: Right;  . CAPD REMOVAL  05/08/2011   Procedure: CONTINUOUS AMBULATORY PERITONEAL DIALYSIS  (CAPD) CATHETER REMOVAL;  Surgeon: [name], MD;  [facility];  Service: General;  Laterality: N/A;  Removal of CAPD catheter, Dr. requests to go after 100  . INSERTION OF DIALYSIS CATHETER  10/05/10   Right Femoral Cath insertion by Dr. [name].  Pt ahas had several caths inserted.  . IR FLUORO GUIDE CV LINE RIGHT  05/11/2018  . IR US GUIDE VASC ACCESS RIGHT  05/11/2018  . KIDNEY TRANSPLANT  2014  . UPPER EXTREMITY ANGIOGRAPHY Bilateral 05/13/2018  Procedure: UPPER EXTREMITY ANGIOGRAPHY - bilarteral;  Surgeon: Marty Heck, MD;  Location: [facility];  Service: Cardiovascular;  Laterality: Bilateral;    Family History  Problem Relation Age of Onset  . Diabetes Father   . Stroke Father   . Anesthesia problems Neg Hx     Social History   Socioeconomic History  . Marital status: Single    Spouse name: Not on file  . Number of children: 0  . Years of education: Not on file  . Highest education level: Not on file  Occupational History     Employer: [employer]  . Financial resource strain: Not on file  . Food insecurity:    Worry: Not on file    Inability: Not on file  . Transportation needs:    Medical: Not on file    Non-medical: Not on file  Tobacco Use  . Smoking status: Never Smoker  . Smokeless tobacco: Never Used  Substance and Sexual Activity  . Alcohol use: No  . Drug use: Yes    Frequency: 5.0 times per week    Types: Marijuana  . Sexual activity: Not on file  Lifestyle  . Physical activity:    Days per week: Not on file    Minutes per session: Not on file  . Stress: Not on file  Relationships  . Social connections:    Talks on phone: Not on file    Gets together: Not on file    Attends religious service: Not on file    Active member of club or organization: Not on file    Attends meetings of clubs or organizations: Not on file    Relationship status: Not on file  . Intimate partner violence:    Fear of current or ex partner: Not on file    Emotionally abused: Not on file    Physically abused: Not on file    Forced sexual activity: Not on file  Other Topics Concern  . Not on file  Social History Narrative   Lives alone.      Current Outpatient Medications:  .  imiquimod (ALDARA) 5 % cream, Apply thin layer to affected areas three times per week prior to bed., Disp: 12 each, Rfl: 1  EXAM:  VITALS per patient if applicable: BP (!) 681/275   Pulse 94   Temp 98.2 F (36.8 C) (Oral)   Ht 6\' 2"  (1.88 m)   Wt (!) 325 lb 9.9 oz (147.7 kg)   BMI 41.81 kg/m   GENERAL: alert, oriented, appears well and in no acute distress  HEENT: atraumatic, conjunttiva clear, no obvious abnormalities on inspection of external nose and ears  NECK: normal movements of the head and neck  LUNGS: on inspection no signs of respiratory distress, breathing rate appears normal, no obvious gross SOB, gasping or wheezing  CV: no obvious cyanosis  MS: moves all visible extremities without noticeable  abnormality  PSYCH/NEURO: pleasant and cooperative, no obvious depression or anxiety, speech and thought processing grossly intact  ASSESSMENT AND PLAN:  Discussed the following assessment and plan:  Hematuria -He will stop by office this afternoon for UA and GC/Chlamydia testing   Genital warts -Consistent with condyloma -Discussed cryo treatment vs imiquimod, would prefer to try imiquimod.  -Not currently on immunosuppressants, reviewed side effects.         I discussed the assessment and treatment plan with the patient. The patient was provided an opportunity to ask questions and all  were answered. The patient agreed with the plan and demonstrated an understanding of the instructions.   The patient was advised to call back or seek an in-person evaluation if the symptoms worsen or if the condition fails to improve as anticipated.     Luetta Nutting, DO

## 2018-11-03 NOTE — Addendum Note (Signed)
Addended by: Perlie Mayo on: 11/03/2018 03:52 PM   Modules accepted: Orders

## 2018-11-03 NOTE — Assessment & Plan Note (Signed)
-  He will stop by office this afternoon for UA and GC/Chlamydia testing

## 2018-11-03 NOTE — Addendum Note (Signed)
Addended by: Lynnea Ferrier on: 11/03/2018 03:08 PM   Modules accepted: Orders

## 2018-11-04 LAB — URINE CULTURE
MICRO NUMBER:: 510064
Result:: NO GROWTH
SPECIMEN QUALITY:: ADEQUATE

## 2018-11-04 NOTE — Progress Notes (Signed)
Added on per your request.

## 2018-11-05 LAB — URINE CYTOLOGY ANCILLARY ONLY
Chlamydia: NEGATIVE
Neisseria Gonorrhea: NEGATIVE

## 2018-11-09 NOTE — Progress Notes (Signed)
Urine culture, Gc/chlamydia are negative.  Would recommend he discuss blood in urine with nephrologist as well.  If continues he may need to see urology.

## 2018-11-30 ENCOUNTER — Inpatient Hospital Stay (HOSPITAL_COMMUNITY): Payer: 59

## 2018-11-30 ENCOUNTER — Encounter (HOSPITAL_COMMUNITY): Payer: Self-pay | Admitting: Emergency Medicine

## 2018-11-30 ENCOUNTER — Emergency Department (HOSPITAL_COMMUNITY): Payer: 59

## 2018-11-30 ENCOUNTER — Inpatient Hospital Stay (HOSPITAL_COMMUNITY)
Admission: EM | Admit: 2018-11-30 | Discharge: 2018-12-13 | DRG: 870 | Disposition: A | Discharge status: Rehabilitation Facility | Payer: 59 | Attending: Family Medicine | Admitting: Family Medicine

## 2018-11-30 ENCOUNTER — Other Ambulatory Visit: Payer: Self-pay

## 2018-11-30 DIAGNOSIS — E1122 Type 2 diabetes mellitus with diabetic chronic kidney disease: Secondary | ICD-10-CM | POA: Diagnosis present

## 2018-11-30 DIAGNOSIS — R0902 Hypoxemia: Secondary | ICD-10-CM

## 2018-11-30 DIAGNOSIS — R509 Fever, unspecified: Secondary | ICD-10-CM | POA: Diagnosis present

## 2018-11-30 DIAGNOSIS — R0602 Shortness of breath: Secondary | ICD-10-CM | POA: Diagnosis not present

## 2018-11-30 DIAGNOSIS — D631 Anemia in chronic kidney disease: Secondary | ICD-10-CM | POA: Diagnosis present

## 2018-11-30 DIAGNOSIS — D638 Anemia in other chronic diseases classified elsewhere: Secondary | ICD-10-CM | POA: Diagnosis not present

## 2018-11-30 DIAGNOSIS — J690 Pneumonitis due to inhalation of food and vomit: Secondary | ICD-10-CM | POA: Diagnosis present

## 2018-11-30 DIAGNOSIS — E039 Hypothyroidism, unspecified: Secondary | ICD-10-CM | POA: Diagnosis present

## 2018-11-30 DIAGNOSIS — I959 Hypotension, unspecified: Secondary | ICD-10-CM

## 2018-11-30 DIAGNOSIS — G92 Toxic encephalopathy: Secondary | ICD-10-CM | POA: Diagnosis not present

## 2018-11-30 DIAGNOSIS — T8612 Kidney transplant failure: Secondary | ICD-10-CM | POA: Diagnosis not present

## 2018-11-30 DIAGNOSIS — Z833 Family history of diabetes mellitus: Secondary | ICD-10-CM | POA: Diagnosis not present

## 2018-11-30 DIAGNOSIS — Z6841 Body Mass Index (BMI) 40.0 and over, adult: Secondary | ICD-10-CM

## 2018-11-30 DIAGNOSIS — L8990 Pressure ulcer of unspecified site, unspecified stage: Secondary | ICD-10-CM | POA: Insufficient documentation

## 2018-11-30 DIAGNOSIS — N186 End stage renal disease: Secondary | ICD-10-CM | POA: Diagnosis present

## 2018-11-30 DIAGNOSIS — E1169 Type 2 diabetes mellitus with other specified complication: Secondary | ICD-10-CM

## 2018-11-30 DIAGNOSIS — J80 Acute respiratory distress syndrome: Secondary | ICD-10-CM | POA: Diagnosis present

## 2018-11-30 DIAGNOSIS — K7200 Acute and subacute hepatic failure without coma: Secondary | ICD-10-CM | POA: Diagnosis not present

## 2018-11-30 DIAGNOSIS — R6521 Severe sepsis with septic shock: Secondary | ICD-10-CM | POA: Diagnosis present

## 2018-11-30 DIAGNOSIS — I160 Hypertensive urgency: Secondary | ICD-10-CM | POA: Diagnosis present

## 2018-11-30 DIAGNOSIS — I10 Essential (primary) hypertension: Secondary | ICD-10-CM

## 2018-11-30 DIAGNOSIS — E875 Hyperkalemia: Secondary | ICD-10-CM | POA: Diagnosis present

## 2018-11-30 DIAGNOSIS — A419 Sepsis, unspecified organism: Principal | ICD-10-CM | POA: Diagnosis present

## 2018-11-30 DIAGNOSIS — E119 Type 2 diabetes mellitus without complications: Secondary | ICD-10-CM | POA: Diagnosis not present

## 2018-11-30 DIAGNOSIS — I248 Other forms of acute ischemic heart disease: Secondary | ICD-10-CM | POA: Diagnosis not present

## 2018-11-30 DIAGNOSIS — J9601 Acute respiratory failure with hypoxia: Secondary | ICD-10-CM

## 2018-11-30 DIAGNOSIS — J9690 Respiratory failure, unspecified, unspecified whether with hypoxia or hypercapnia: Secondary | ICD-10-CM

## 2018-11-30 DIAGNOSIS — T4275XA Adverse effect of unspecified antiepileptic and sedative-hypnotic drugs, initial encounter: Secondary | ICD-10-CM | POA: Diagnosis not present

## 2018-11-30 DIAGNOSIS — R7989 Other specified abnormal findings of blood chemistry: Secondary | ICD-10-CM

## 2018-11-30 DIAGNOSIS — T402X5A Adverse effect of other opioids, initial encounter: Secondary | ICD-10-CM | POA: Diagnosis not present

## 2018-11-30 DIAGNOSIS — L89892 Pressure ulcer of other site, stage 2: Secondary | ICD-10-CM

## 2018-11-30 DIAGNOSIS — I5032 Chronic diastolic (congestive) heart failure: Secondary | ICD-10-CM | POA: Diagnosis present

## 2018-11-30 DIAGNOSIS — E874 Mixed disorder of acid-base balance: Secondary | ICD-10-CM | POA: Diagnosis not present

## 2018-11-30 DIAGNOSIS — E46 Unspecified protein-calorie malnutrition: Secondary | ICD-10-CM | POA: Diagnosis present

## 2018-11-30 DIAGNOSIS — J9602 Acute respiratory failure with hypercapnia: Secondary | ICD-10-CM | POA: Diagnosis not present

## 2018-11-30 DIAGNOSIS — K219 Gastro-esophageal reflux disease without esophagitis: Secondary | ICD-10-CM | POA: Diagnosis present

## 2018-11-30 DIAGNOSIS — Z20828 Contact with and (suspected) exposure to other viral communicable diseases: Secondary | ICD-10-CM | POA: Diagnosis not present

## 2018-11-30 DIAGNOSIS — G9340 Encephalopathy, unspecified: Secondary | ICD-10-CM

## 2018-11-30 DIAGNOSIS — J9600 Acute respiratory failure, unspecified whether with hypoxia or hypercapnia: Secondary | ICD-10-CM

## 2018-11-30 DIAGNOSIS — J811 Chronic pulmonary edema: Secondary | ICD-10-CM | POA: Diagnosis not present

## 2018-11-30 DIAGNOSIS — Z9119 Patient's noncompliance with other medical treatment and regimen: Secondary | ICD-10-CM | POA: Diagnosis not present

## 2018-11-30 DIAGNOSIS — E669 Obesity, unspecified: Secondary | ICD-10-CM | POA: Diagnosis not present

## 2018-11-30 DIAGNOSIS — K5903 Drug induced constipation: Secondary | ICD-10-CM | POA: Diagnosis not present

## 2018-11-30 DIAGNOSIS — R5381 Other malaise: Secondary | ICD-10-CM | POA: Diagnosis not present

## 2018-11-30 DIAGNOSIS — I480 Paroxysmal atrial fibrillation: Secondary | ICD-10-CM | POA: Diagnosis present

## 2018-11-30 DIAGNOSIS — J189 Pneumonia, unspecified organism: Secondary | ICD-10-CM

## 2018-11-30 DIAGNOSIS — Z823 Family history of stroke: Secondary | ICD-10-CM | POA: Diagnosis not present

## 2018-11-30 DIAGNOSIS — Z992 Dependence on renal dialysis: Secondary | ICD-10-CM | POA: Diagnosis not present

## 2018-11-30 DIAGNOSIS — G4733 Obstructive sleep apnea (adult) (pediatric): Secondary | ICD-10-CM | POA: Diagnosis present

## 2018-11-30 DIAGNOSIS — G9341 Metabolic encephalopathy: Secondary | ICD-10-CM | POA: Diagnosis not present

## 2018-11-30 DIAGNOSIS — N185 Chronic kidney disease, stage 5: Secondary | ICD-10-CM | POA: Diagnosis not present

## 2018-11-30 DIAGNOSIS — Z91199 Patient's noncompliance with other medical treatment and regimen due to unspecified reason: Secondary | ICD-10-CM

## 2018-11-30 DIAGNOSIS — R7309 Other abnormal glucose: Secondary | ICD-10-CM | POA: Diagnosis not present

## 2018-11-30 DIAGNOSIS — Z452 Encounter for adjustment and management of vascular access device: Secondary | ICD-10-CM

## 2018-11-30 HISTORY — DX: Sepsis, unspecified organism: A41.9

## 2018-11-30 HISTORY — DX: Acute respiratory failure with hypoxia: J96.01

## 2018-11-30 LAB — POCT I-STAT 7, (LYTES, BLD GAS, ICA,H+H)
Acid-base deficit: 4 mmol/L — ABNORMAL HIGH (ref 0.0–2.0)
Acid-base deficit: 1 mmol/L (ref 0.0–2.0)
Acid-base deficit: 1 mmol/L (ref 0.0–2.0)
Acid-base deficit: 3 mmol/L — ABNORMAL HIGH (ref 0.0–2.0)
Bicarbonate: 23.9 mmol/L (ref 20.0–28.0)
Bicarbonate: 23.5 mmol/L (ref 20.0–28.0)
Bicarbonate: 25.4 mmol/L (ref 20.0–28.0)
Bicarbonate: 23.2 mmol/L (ref 20.0–28.0)
Calcium, Ion: 1.09 mmol/L — ABNORMAL LOW (ref 1.15–1.40)
Calcium, Ion: 1.07 mmol/L — ABNORMAL LOW (ref 1.15–1.40)
Calcium, Ion: 1.13 mmol/L — ABNORMAL LOW (ref 1.15–1.40)
Calcium, Ion: 1.07 mmol/L — ABNORMAL LOW (ref 1.15–1.40)
HCT: 30 % — ABNORMAL LOW (ref 39.0–52.0)
HCT: 28 % — ABNORMAL LOW (ref 39.0–52.0)
HCT: 27 % — ABNORMAL LOW (ref 39.0–52.0)
HCT: 31 % — ABNORMAL LOW (ref 39.0–52.0)
Hemoglobin: 10.2 g/dL — ABNORMAL LOW (ref 13.0–17.0)
Hemoglobin: 9.5 g/dL — ABNORMAL LOW (ref 13.0–17.0)
Hemoglobin: 9.2 g/dL — ABNORMAL LOW (ref 13.0–17.0)
Hemoglobin: 10.5 g/dL — ABNORMAL LOW (ref 13.0–17.0)
O2 Saturation: 57 %
O2 Saturation: 84 %
O2 Saturation: 81 %
O2 Saturation: 86 %
Patient temperature: 101.1
Patient temperature: 98.6
Potassium: 4.5 mmol/L (ref 3.5–5.1)
Potassium: 4.4 mmol/L (ref 3.5–5.1)
Potassium: 4.2 mmol/L (ref 3.5–5.1)
Potassium: 4 mmol/L (ref 3.5–5.1)
Sodium: 136 mmol/L (ref 135–145)
Sodium: 136 mmol/L (ref 135–145)
Sodium: 136 mmol/L (ref 135–145)
Sodium: 136 mmol/L (ref 135–145)
TCO2: 26 mmol/L (ref 22–32)
TCO2: 25 mmol/L (ref 22–32)
TCO2: 27 mmol/L (ref 22–32)
TCO2: 25 mmol/L (ref 22–32)
pCO2 arterial: 54.9 mmHg — ABNORMAL HIGH (ref 32.0–48.0)
pCO2 arterial: 38.9 mmHg (ref 32.0–48.0)
pCO2 arterial: 47.7 mmHg (ref 32.0–48.0)
pCO2 arterial: 47.1 mmHg (ref 32.0–48.0)
pH, Arterial: 7.247 — ABNORMAL LOW (ref 7.350–7.450)
pH, Arterial: 7.395 (ref 7.350–7.450)
pH, Arterial: 7.335 — ABNORMAL LOW (ref 7.350–7.450)
pH, Arterial: 7.3 — ABNORMAL LOW (ref 7.350–7.450)
pO2, Arterial: 35 mmHg — CL (ref 83.0–108.0)
pO2, Arterial: 53 mmHg — ABNORMAL LOW (ref 83.0–108.0)
pO2, Arterial: 48 mmHg — ABNORMAL LOW (ref 83.0–108.0)
pO2, Arterial: 57 mmHg — ABNORMAL LOW (ref 83.0–108.0)

## 2018-11-30 LAB — BLOOD GAS, ARTERIAL
Acid-base deficit: 2.5 mmol/L — ABNORMAL HIGH (ref 0.0–2.0)
Bicarbonate: 22.7 mmol/L (ref 20.0–28.0)
Drawn by: 560021
FIO2: 100
O2 Saturation: 77.4 %
PEEP: 15 cmH2O
Patient temperature: 98.6
Pressure control: 22 cmH2O
RATE: 16 resp/min
pCO2 arterial: 45.3 mmHg (ref 32.0–48.0)
pH, Arterial: 7.319 — ABNORMAL LOW (ref 7.350–7.450)
pO2, Arterial: 51.8 mmHg — ABNORMAL LOW (ref 83.0–108.0)

## 2018-11-30 LAB — CBC
HCT: 27.3 % — ABNORMAL LOW (ref 39.0–52.0)
Hemoglobin: 8.3 g/dL — ABNORMAL LOW (ref 13.0–17.0)
MCH: 26.9 pg (ref 26.0–34.0)
MCHC: 30.4 g/dL (ref 30.0–36.0)
MCV: 88.3 fL (ref 80.0–100.0)
Platelets: 356 10*3/uL (ref 150–400)
RBC: 3.09 MIL/uL — ABNORMAL LOW (ref 4.22–5.81)
RDW: 16.7 % — ABNORMAL HIGH (ref 11.5–15.5)
WBC: 10.4 10*3/uL (ref 4.0–10.5)
nRBC: 0 % (ref 0.0–0.2)

## 2018-11-30 LAB — SARS CORONAVIRUS 2 BY RT PCR (HOSPITAL ORDER, PERFORMED IN ~~LOC~~ HOSPITAL LAB): SARS Coronavirus 2: NEGATIVE

## 2018-11-30 LAB — MRSA PCR SCREENING: MRSA by PCR: NEGATIVE

## 2018-11-30 LAB — COMPREHENSIVE METABOLIC PANEL
ALT: 12 U/L (ref 0–44)
AST: 12 U/L — ABNORMAL LOW (ref 15–41)
Albumin: 2.8 g/dL — ABNORMAL LOW (ref 3.5–5.0)
Alkaline Phosphatase: 42 U/L (ref 38–126)
Anion gap: 18 — ABNORMAL HIGH (ref 5–15)
BUN: 74 mg/dL — ABNORMAL HIGH (ref 6–20)
CO2: 21 mmol/L — ABNORMAL LOW (ref 22–32)
Calcium: 8.3 mg/dL — ABNORMAL LOW (ref 8.9–10.3)
Chloride: 100 mmol/L (ref 98–111)
Creatinine, Ser: 17.47 mg/dL — ABNORMAL HIGH (ref 0.61–1.24)
GFR calc Af Amer: 3 mL/min — ABNORMAL LOW (ref >60)
GFR calc non Af Amer: 3 mL/min — ABNORMAL LOW (ref >60)
Glucose, Bld: 111 mg/dL — ABNORMAL HIGH (ref 70–99)
Potassium: 4.6 mmol/L (ref 3.5–5.1)
Sodium: 139 mmol/L (ref 135–145)
Total Bilirubin: 1.1 mg/dL (ref 0.3–1.2)
Total Protein: 6.9 g/dL (ref 6.5–8.1)

## 2018-11-30 LAB — CBC WITH DIFFERENTIAL/PLATELET
Abs Immature Granulocytes: 0.1 10*3/uL — ABNORMAL HIGH (ref 0.00–0.07)
Basophils Absolute: 0.1 10*3/uL (ref 0.0–0.1)
Basophils Relative: 1 %
Eosinophils Absolute: 0.1 10*3/uL (ref 0.0–0.5)
Eosinophils Relative: 1 %
HCT: 25.7 % — ABNORMAL LOW (ref 39.0–52.0)
Hemoglobin: 8.1 g/dL — ABNORMAL LOW (ref 13.0–17.0)
Immature Granulocytes: 1 %
Lymphocytes Relative: 6 %
Lymphs Abs: 0.7 10*3/uL (ref 0.7–4.0)
MCH: 27.4 pg (ref 26.0–34.0)
MCHC: 31.5 g/dL (ref 30.0–36.0)
MCV: 86.8 fL (ref 80.0–100.0)
Monocytes Absolute: 1 10*3/uL (ref 0.1–1.0)
Monocytes Relative: 9 %
Neutro Abs: 9.7 10*3/uL — ABNORMAL HIGH (ref 1.7–7.7)
Neutrophils Relative %: 82 %
Platelets: 327 10*3/uL (ref 150–400)
RBC: 2.96 MIL/uL — ABNORMAL LOW (ref 4.22–5.81)
RDW: 16.6 % — ABNORMAL HIGH (ref 11.5–15.5)
WBC: 11.7 10*3/uL — ABNORMAL HIGH (ref 4.0–10.5)
nRBC: 0 % (ref 0.0–0.2)

## 2018-11-30 LAB — LACTIC ACID, PLASMA
Lactic Acid, Venous: 1.2 mmol/L (ref 0.5–1.9)
Lactic Acid, Venous: 2.6 mmol/L (ref 0.5–1.9)

## 2018-11-30 LAB — PHOSPHORUS
Phosphorus: 6.4 mg/dL — ABNORMAL HIGH (ref 2.5–4.6)
Phosphorus: 8.2 mg/dL — ABNORMAL HIGH (ref 2.5–4.6)

## 2018-11-30 LAB — BASIC METABOLIC PANEL
Anion gap: 17 — ABNORMAL HIGH (ref 5–15)
BUN: 69 mg/dL — ABNORMAL HIGH (ref 6–20)
CO2: 23 mmol/L (ref 22–32)
Calcium: 8.7 mg/dL — ABNORMAL LOW (ref 8.9–10.3)
Chloride: 96 mmol/L — ABNORMAL LOW (ref 98–111)
Creatinine, Ser: 15.23 mg/dL — ABNORMAL HIGH (ref 0.61–1.24)
GFR calc Af Amer: 4 mL/min — ABNORMAL LOW (ref >60)
GFR calc non Af Amer: 3 mL/min — ABNORMAL LOW (ref >60)
Glucose, Bld: 106 mg/dL — ABNORMAL HIGH (ref 70–99)
Potassium: 4.8 mmol/L (ref 3.5–5.1)
Sodium: 136 mmol/L (ref 135–145)

## 2018-11-30 LAB — GLUCOSE, CAPILLARY
Glucose-Capillary: 64 mg/dL — ABNORMAL LOW (ref 70–99)
Glucose-Capillary: 81 mg/dL (ref 70–99)

## 2018-11-30 LAB — MAGNESIUM
Magnesium: 2.4 mg/dL (ref 1.7–2.4)
Magnesium: 2.4 mg/dL (ref 1.7–2.4)

## 2018-11-30 LAB — CORTISOL: Cortisol, Plasma: 20 ug/dL

## 2018-11-30 LAB — PROCALCITONIN: Procalcitonin: 16.32 ng/mL

## 2018-11-30 LAB — PROTIME-INR
INR: 1.5 — ABNORMAL HIGH (ref 0.8–1.2)
Prothrombin Time: 18.3 seconds — ABNORMAL HIGH (ref 11.4–15.2)

## 2018-11-30 LAB — TROPONIN I (HIGH SENSITIVITY): Troponin I (High Sensitivity): 66 ng/L — ABNORMAL HIGH (ref <18)

## 2018-11-30 LAB — APTT: aPTT: 31 seconds (ref 24–36)

## 2018-11-30 MED ORDER — SODIUM CHLORIDE 0.9 % IV SOLN
2.0000 g | INTRAVENOUS | Status: DC
  Administered 2018-11-30: 2 g via INTRAVENOUS
  Filled 2018-11-30: qty 2

## 2018-11-30 MED ORDER — SODIUM CHLORIDE 0.9 % IV SOLN
2.0000 g | Freq: Once | INTRAVENOUS | Status: AC
  Administered 2018-11-30: 2 g via INTRAVENOUS
  Filled 2018-11-30: qty 2

## 2018-11-30 MED ORDER — DEXMEDETOMIDINE HCL IN NACL 400 MCG/100ML IV SOLN
0.4000 ug/kg/h | INTRAVENOUS | Status: DC
  Administered 2018-11-30: 1 ug/kg/h via INTRAVENOUS
  Filled 2018-11-30 (×2): qty 100

## 2018-11-30 MED ORDER — FENTANYL CITRATE (PF) 100 MCG/2ML IJ SOLN
50.0000 ug | Freq: Once | INTRAMUSCULAR | Status: AC
  Administered 2018-11-30: 50 ug via INTRAVENOUS

## 2018-11-30 MED ORDER — FENTANYL 2500MCG IN NS 250ML (10MCG/ML) PREMIX INFUSION
50.0000 ug/h | INTRAVENOUS | Status: DC
  Administered 2018-11-30: 50 ug/h via INTRAVENOUS
  Filled 2018-11-30: qty 250

## 2018-11-30 MED ORDER — MIDAZOLAM HCL 2 MG/2ML IJ SOLN
INTRAMUSCULAR | Status: AC
  Filled 2018-11-30: qty 2

## 2018-11-30 MED ORDER — DEXTROSE 50 % IV SOLN
25.0000 mL | Freq: Once | INTRAVENOUS | Status: AC
  Administered 2018-11-30: 25 mL via INTRAVENOUS

## 2018-11-30 MED ORDER — MIDAZOLAM HCL 5 MG/5ML IJ SOLN
INTRAMUSCULAR | Status: AC | PRN
  Administered 2018-11-30: 2 mg via INTRAVENOUS

## 2018-11-30 MED ORDER — IPRATROPIUM-ALBUTEROL 0.5-2.5 (3) MG/3ML IN SOLN: 3.0000 mL | RESPIRATORY_TRACT | Status: DC | PRN

## 2018-11-30 MED ORDER — DEXTROSE 10 % IV SOLN: INTRAVENOUS | Status: DC | PRN

## 2018-11-30 MED ORDER — SUCROFERRIC OXYHYDROXIDE 500 MG PO CHEW
1000.0000 mg | CHEWABLE_TABLET | Freq: Three times a day (TID) | ORAL | Status: DC
  Administered 2018-12-01 – 2018-12-10 (×21): 1000 mg via ORAL
  Filled 2018-11-30 (×30): qty 2

## 2018-11-30 MED ORDER — FENTANYL 2500MCG IN NS 250ML (10MCG/ML) PREMIX INFUSION
0.0000 ug/h | INTRAVENOUS | Status: DC
  Administered 2018-11-30: 50 ug/h via INTRAVENOUS
  Administered 2018-12-01 – 2018-12-02 (×2): 200 ug/h via INTRAVENOUS
  Administered 2018-12-02: 12:00:00 150 ug/h via INTRAVENOUS
  Filled 2018-11-30 (×3): qty 250

## 2018-11-30 MED ORDER — PROPOFOL 1000 MG/100ML IV EMUL: 5.0000 ug/kg/min | INTRAVENOUS | Status: DC

## 2018-11-30 MED ORDER — ATORVASTATIN CALCIUM 10 MG PO TABS
20.0000 mg | ORAL_TABLET | Freq: Every day | ORAL | Status: DC
  Administered 2018-12-01: 10:00:00 20 mg via ORAL
  Filled 2018-11-30: qty 2

## 2018-11-30 MED ORDER — FENTANYL CITRATE (PF) 100 MCG/2ML IJ SOLN
INTRAMUSCULAR | Status: AC | PRN
  Administered 2018-11-30: 100 ug via INTRAVENOUS

## 2018-11-30 MED ORDER — ALBUTEROL SULFATE (2.5 MG/3ML) 0.083% IN NEBU: 2.5000 mg | INHALATION_SOLUTION | RESPIRATORY_TRACT | Status: DC | PRN

## 2018-11-30 MED ORDER — VANCOMYCIN HCL 10 G IV SOLR
2500.0000 mg | Freq: Once | INTRAVENOUS | Status: AC
  Administered 2018-11-30: 2500 mg via INTRAVENOUS
  Filled 2018-11-30: qty 2500

## 2018-11-30 MED ORDER — ALBUTEROL SULFATE (2.5 MG/3ML) 0.083% IN NEBU
2.5000 mg | INHALATION_SOLUTION | RESPIRATORY_TRACT | Status: AC
  Administered 2018-11-30: 2.5 mg via RESPIRATORY_TRACT
  Filled 2018-11-30: qty 3

## 2018-11-30 MED ORDER — FENTANYL CITRATE (PF) 100 MCG/2ML IJ SOLN: 50.0000 ug | Freq: Once | INTRAMUSCULAR | Status: DC

## 2018-11-30 MED ORDER — ACETAMINOPHEN 325 MG PO TABS
650.0000 mg | ORAL_TABLET | Freq: Four times a day (QID) | ORAL | Status: DC | PRN
  Administered 2018-11-30 – 2018-12-05 (×2): 650 mg via ORAL
  Filled 2018-11-30 (×2): qty 2

## 2018-11-30 MED ORDER — INSULIN REGULAR(HUMAN) IN NACL 100-0.9 UT/100ML-% IV SOLN: INTRAVENOUS | Status: DC

## 2018-11-30 MED ORDER — FENTANYL BOLUS VIA INFUSION: 50.0000 ug | INTRAVENOUS | Status: DC | PRN

## 2018-11-30 MED ORDER — CHLORHEXIDINE GLUCONATE CLOTH 2 % EX PADS
6.0000 | MEDICATED_PAD | Freq: Every day | CUTANEOUS | Status: DC
  Administered 2018-12-01 – 2018-12-04 (×3): 6 via TOPICAL

## 2018-11-30 MED ORDER — PROPOFOL 1000 MG/100ML IV EMUL
0.0000 ug/kg/min | INTRAVENOUS | Status: DC
  Administered 2018-11-30: 30 ug/kg/min via INTRAVENOUS
  Administered 2018-11-30: 10 ug/kg/min via INTRAVENOUS
  Filled 2018-11-30: qty 100

## 2018-11-30 MED ORDER — SODIUM CHLORIDE 0.9 % IV SOLN
0.5000 ug/kg/min | INTRAVENOUS | Status: DC
  Administered 2018-11-30: 3 ug/kg/min via INTRAVENOUS
  Administered 2018-12-01: 1 ug/kg/min via INTRAVENOUS
  Filled 2018-11-30 (×5): qty 20

## 2018-11-30 MED ORDER — ARTIFICIAL TEARS OPHTHALMIC OINT
1.0000 "application " | TOPICAL_OINTMENT | Freq: Three times a day (TID) | OPHTHALMIC | Status: DC
  Administered 2018-11-30 – 2018-12-02 (×5): 1 via OPHTHALMIC
  Filled 2018-11-30: qty 3.5

## 2018-11-30 MED ORDER — GUAIFENESIN ER 600 MG PO TB12
600.0000 mg | ORAL_TABLET | Freq: Two times a day (BID) | ORAL | Status: DC
  Administered 2018-12-01: 10:00:00 600 mg via ORAL
  Filled 2018-11-30 (×4): qty 1

## 2018-11-30 MED ORDER — ARTIFICIAL TEARS OPHTHALMIC OINT: 1.0000 "application " | TOPICAL_OINTMENT | Freq: Three times a day (TID) | OPHTHALMIC | Status: DC

## 2018-11-30 MED ORDER — MIDAZOLAM HCL 2 MG/2ML IJ SOLN
2.0000 mg | Freq: Once | INTRAMUSCULAR | Status: AC
  Administered 2018-11-30: 2 mg via INTRAVENOUS

## 2018-11-30 MED ORDER — FENTANYL 2500MCG IN NS 250ML (10MCG/ML) PREMIX INFUSION: 50.0000 ug/h | INTRAVENOUS | Status: DC

## 2018-11-30 MED ORDER — FENTANYL CITRATE (PF) 100 MCG/2ML IJ SOLN
INTRAMUSCULAR | Status: AC
  Filled 2018-11-30: qty 2

## 2018-11-30 MED ORDER — HYDRALAZINE HCL 20 MG/ML IJ SOLN: 10.0000 mg | INTRAMUSCULAR | Status: DC | PRN

## 2018-11-30 MED ORDER — INSULIN ASPART 100 UNIT/ML ~~LOC~~ SOLN: 1.0000 [IU] | SUBCUTANEOUS | Status: DC

## 2018-11-30 MED ORDER — SODIUM CHLORIDE 0.9 % IV SOLN: 0.0000 ug/kg/min | INTRAVENOUS | Status: DC

## 2018-11-30 MED ORDER — PROPOFOL 1000 MG/100ML IV EMUL
INTRAVENOUS | Status: AC
  Filled 2018-11-30: qty 100

## 2018-11-30 MED ORDER — ROCURONIUM BROMIDE 50 MG/5ML IV SOLN
INTRAVENOUS | Status: AC | PRN
  Administered 2018-11-30: 50 mg via INTRAVENOUS

## 2018-11-30 MED ORDER — ACETAMINOPHEN 650 MG RE SUPP: 650.0000 mg | Freq: Four times a day (QID) | RECTAL | Status: DC | PRN

## 2018-11-30 MED ORDER — ONDANSETRON HCL 4 MG/2ML IJ SOLN
4.0000 mg | Freq: Four times a day (QID) | INTRAMUSCULAR | Status: DC | PRN
  Administered 2018-12-04: 4 mg via INTRAVENOUS
  Filled 2018-11-30: qty 2

## 2018-11-30 MED ORDER — PANTOPRAZOLE SODIUM 40 MG PO PACK
40.0000 mg | PACK | Freq: Every day | ORAL | Status: DC
  Filled 2018-11-30 (×2): qty 20

## 2018-11-30 MED ORDER — NOREPINEPHRINE 4 MG/250ML-% IV SOLN
INTRAVENOUS | Status: AC
  Administered 2018-11-30: 5 ug/min via INTRAVENOUS
  Filled 2018-11-30: qty 250

## 2018-11-30 MED ORDER — ORAL CARE MOUTH RINSE
15.0000 mL | OROMUCOSAL | Status: DC
  Administered 2018-11-30 – 2018-12-06 (×60): 15 mL via OROMUCOSAL

## 2018-11-30 MED ORDER — ONDANSETRON HCL 4 MG PO TABS: 4.0000 mg | ORAL_TABLET | Freq: Four times a day (QID) | ORAL | Status: DC | PRN

## 2018-11-30 MED ORDER — CHLORHEXIDINE GLUCONATE 0.12% ORAL RINSE (MEDLINE KIT)
15.0000 mL | Freq: Two times a day (BID) | OROMUCOSAL | Status: DC
  Administered 2018-11-30 – 2018-12-06 (×12): 15 mL via OROMUCOSAL

## 2018-11-30 MED ORDER — HEPARIN SODIUM (PORCINE) 5000 UNIT/ML IJ SOLN
5000.0000 [IU] | Freq: Three times a day (TID) | INTRAMUSCULAR | Status: DC
  Administered 2018-11-30 – 2018-12-01 (×3): 5000 [IU] via SUBCUTANEOUS
  Filled 2018-11-30 (×3): qty 1

## 2018-11-30 MED ORDER — CISATRACURIUM BESYLATE (PF) 10 MG/5ML IV SOLN
0.1000 mg/kg | Freq: Once | INTRAVENOUS | Status: AC
  Administered 2018-11-30: 14.8 mg via INTRAVENOUS
  Filled 2018-11-30: qty 7.4

## 2018-11-30 MED ORDER — INSULIN ASPART 100 UNIT/ML ~~LOC~~ SOLN: 2.0000 [IU] | SUBCUTANEOUS | Status: DC

## 2018-11-30 MED ORDER — NOREPINEPHRINE 4 MG/250ML-% IV SOLN
0.0000 ug/min | INTRAVENOUS | Status: DC
  Administered 2018-11-30: 20:00:00 5 ug/min via INTRAVENOUS

## 2018-11-30 MED ORDER — ETOMIDATE 2 MG/ML IV SOLN
INTRAVENOUS | Status: AC
  Administered 2018-11-30: 17:00:00 20 mg
  Filled 2018-11-30: qty 10

## 2018-11-30 MED ORDER — MIDAZOLAM 50MG/50ML (1MG/ML) PREMIX INFUSION
2.0000 mg/h | INTRAVENOUS | Status: DC
  Administered 2018-11-30: 2 mg/h via INTRAVENOUS
  Administered 2018-12-01 (×3): 5 mg/h via INTRAVENOUS
  Administered 2018-12-02: 07:00:00 6 mg/h via INTRAVENOUS
  Administered 2018-12-02: 23:00:00 2 mg/h via INTRAVENOUS
  Filled 2018-11-30 (×7): qty 50

## 2018-11-30 MED ORDER — VASOPRESSIN 20 UNIT/ML IV SOLN
0.0400 [IU]/min | INTRAVENOUS | Status: DC
  Administered 2018-11-30: 0.03 [IU]/min via INTRAVENOUS
  Administered 2018-12-01 – 2018-12-03 (×3): 0.04 [IU]/min via INTRAVENOUS
  Filled 2018-11-30 (×5): qty 2

## 2018-11-30 MED ORDER — ACETAMINOPHEN 500 MG PO TABS
1000.0000 mg | ORAL_TABLET | Freq: Once | ORAL | Status: AC
  Administered 2018-11-30: 1000 mg via ORAL
  Filled 2018-11-30: qty 2

## 2018-11-30 MED ORDER — NOREPINEPHRINE 16 MG/250ML-% IV SOLN
0.0000 ug/min | INTRAVENOUS | Status: DC
  Administered 2018-11-30 – 2018-12-01 (×2): 40 ug/min via INTRAVENOUS
  Administered 2018-12-01 (×2): 60 ug/min via INTRAVENOUS
  Administered 2018-12-02: 09:00:00 4 ug/min via INTRAVENOUS
  Filled 2018-11-30 (×6): qty 250

## 2018-11-30 MED ORDER — VANCOMYCIN HCL IN DEXTROSE 1-5 GM/200ML-% IV SOLN
1000.0000 mg | INTRAVENOUS | Status: DC
  Filled 2018-11-30: qty 200

## 2018-11-30 MED ORDER — DEXTROSE 50 % IV SOLN
INTRAVENOUS | Status: AC
  Administered 2018-11-30: 25 mL via INTRAVENOUS
  Filled 2018-11-30: qty 50

## 2018-11-30 MED ORDER — FENTANYL CITRATE (PF) 100 MCG/2ML IJ SOLN
100.0000 ug | Freq: Once | INTRAMUSCULAR | Status: AC
  Administered 2018-11-30: 100 ug via INTRAVENOUS

## 2018-11-30 MED ORDER — FENTANYL BOLUS VIA INFUSION
50.0000 ug | INTRAVENOUS | Status: DC | PRN
  Administered 2018-11-30: 50 ug via INTRAVENOUS
  Filled 2018-11-30: qty 50

## 2018-11-30 MED ORDER — MIDAZOLAM 50MG/50ML (1MG/ML) PREMIX INFUSION: 2.0000 mg/h | INTRAVENOUS | Status: DC

## 2018-11-30 MED ORDER — MIDAZOLAM BOLUS VIA INFUSION
1.0000 mg | INTRAVENOUS | Status: DC | PRN
  Administered 2018-11-30: 2 mg via INTRAVENOUS
  Administered 2018-12-02: 1 mg via INTRAVENOUS
  Administered 2018-12-02 – 2018-12-03 (×3): 2 mg via INTRAVENOUS
  Filled 2018-11-30: qty 2

## 2018-11-30 MED ORDER — INSULIN DETEMIR 100 UNIT/ML ~~LOC~~ SOLN: 5.0000 [IU] | Freq: Two times a day (BID) | SUBCUTANEOUS | Status: DC

## 2018-11-30 MED ORDER — NITROGLYCERIN 2 % TD OINT
0.5000 [in_us] | TOPICAL_OINTMENT | TRANSDERMAL | Status: AC
  Filled 2018-11-30: qty 30

## 2018-11-30 MED ORDER — ALBUTEROL SULFATE HFA 108 (90 BASE) MCG/ACT IN AERS
2.0000 | INHALATION_SPRAY | Freq: Once | RESPIRATORY_TRACT | Status: AC
  Administered 2018-11-30: 2 via RESPIRATORY_TRACT
  Filled 2018-11-30: qty 6.7

## 2018-11-30 MED ORDER — FENTANYL CITRATE (PF) 100 MCG/2ML IJ SOLN
INTRAMUSCULAR | Status: AC
  Administered 2018-11-30: 100 ug
  Filled 2018-11-30: qty 2

## 2018-11-30 MED ORDER — FENTANYL BOLUS VIA INFUSION
50.0000 ug | INTRAVENOUS | Status: DC | PRN
  Administered 2018-12-02 – 2018-12-03 (×4): 50 ug via INTRAVENOUS
  Filled 2018-11-30: qty 50

## 2018-11-30 MED ORDER — CISATRACURIUM BOLUS VIA INFUSION
0.1000 mg/kg | Freq: Once | INTRAVENOUS | Status: AC
  Administered 2018-11-30: 14.7 mg via INTRAVENOUS
  Filled 2018-11-30: qty 15

## 2018-11-30 MED ORDER — FENTANYL CITRATE (PF) 100 MCG/2ML IJ SOLN
50.0000 ug | Freq: Once | INTRAMUSCULAR | Status: DC
  Filled 2018-11-30: qty 2

## 2018-11-30 MED ORDER — MIDAZOLAM BOLUS VIA INFUSION: 1.0000 mg | INTRAVENOUS | Status: DC | PRN

## 2018-11-30 MED ORDER — ONDANSETRON HCL 4 MG/2ML IJ SOLN
4.0000 mg | Freq: Once | INTRAMUSCULAR | Status: AC
  Administered 2018-11-30: 4 mg via INTRAVENOUS
  Filled 2018-11-30: qty 2

## 2018-11-30 MED ORDER — PROMETHAZINE HCL 25 MG/ML IJ SOLN
12.5000 mg | Freq: Once | INTRAMUSCULAR | Status: DC
  Filled 2018-11-30: qty 1

## 2018-11-30 MED ORDER — CISATRACURIUM BOLUS VIA INFUSION: 0.1000 mg/kg | Freq: Once | INTRAVENOUS | Status: DC

## 2018-11-30 MED ORDER — MIDAZOLAM HCL 2 MG/2ML IJ SOLN
INTRAMUSCULAR | Status: AC
  Filled 2018-11-30: qty 4

## 2018-11-30 MED ORDER — SUCCINYLCHOLINE CHLORIDE 20 MG/ML IJ SOLN
INTRAMUSCULAR | Status: AC | PRN
  Administered 2018-11-30: 120 mg via INTRAVENOUS

## 2018-11-30 MED ORDER — MIDAZOLAM HCL 2 MG/2ML IJ SOLN
INTRAMUSCULAR | Status: AC
  Administered 2018-11-30: 20:00:00 2 mg via INTRAVENOUS
  Filled 2018-11-30: qty 2

## 2018-11-30 MED ORDER — ALBUMIN HUMAN 25 % IV SOLN
25.0000 g | Freq: Once | INTRAVENOUS | Status: AC
  Administered 2018-11-30: 21:00:00 25 g via INTRAVENOUS
  Filled 2018-11-30: qty 50

## 2018-11-30 MED ORDER — ETOMIDATE 2 MG/ML IV SOLN
INTRAVENOUS | Status: AC | PRN
  Administered 2018-11-30: 20 mg via INTRAVENOUS

## 2018-11-30 NOTE — Code Documentation (Signed)
Critical care at bedside. Intubating pt

## 2018-11-30 NOTE — ED Notes (Signed)
RN navigator spoke with patients father. Updated him that patient will be admitted to ICU due to respiratory failure. Please keep family informed with updates.

## 2018-11-30 NOTE — ED Notes (Signed)
Paged admitting-renal- per RN Kathlee Nations

## 2018-11-30 NOTE — Consult Note (Signed)
Renal Service Consult Note [facility]  [facility] 11/30/2018 Sol Blazing Requesting Physician:  Dr Har[place]  Reason for Consult:  ESRD pt dypsnea, fevers HPI: The patient is a 43 y.o. year-old w hx of obestiy, PAF, HTN, DM2 and ESRD on HD TTS presenting w/ < 24 hr of severe SOB. Sent from dialysis, SpO2 72% on arrival to ED.  Doing a little better now on nasal O2.  Didn't tolerate attempt at bipap. CXR w/ RLL consolidation and bilat probable pulm edema. BP's very high in ED.  Temp 100.4.  No GI symptoms.  Nonprod cough. Asked to see for dialysis.   Per patient he is HD TTS but last HD was Friday because he was going out of town.  He thinks he has too much fluid on.  No N/V, no diarrhea.  No access issues.   Started HD in 2006, got transplant in 2014 lasted until Dec 2019 now back on HD.  Has L thigh AVG as of Jun 2020.  Failed PD in the past due to recur infection.    ROS  denies CP  no joint pain   no HA  no blurry vision  no rash  no diarrhea  no nausea/ vomiting   Past Medical History  Past Medical History:  Diagnosis Date  . Anemia    ESRD  . CHF (congestive heart failure) ([facility])   . Chronic kidney disease 06/07/2011   Hemodialysis-TTS Last hemo today 05/08/2011  . Diabetes mellitus without complication ([place])   . GERD (gastroesophageal reflux disease)   . Hypertension 111/29/2012   pt states he takes no HTN meds  . Hypothyroidism, secondary   . Morbid obesity ([place])   . Paroxysmal atrial fibrillation ([facility])   . Sleep apnea 06/07/2011   Pt had sllep study done 2 weeks ago- Dr. Joellyn Quails.  Does not  have a CPAP at this time.   Past Surgical History  Past Surgical History:  Procedure Laterality Date  . ARTERIOVENOUS GRAFT PLACEMENT  08/09/10   Left Thigh Graft by Dr. [name]  . AV FISTULA PLACEMENT Right 05/18/2018   Procedure: INSERTION OF ARTERIOVENOUS (AV) GORE-TEX GRAFT Left THIGH;  Surgeon: Waynetta Sandy, MD;   Location: [facility];  Service: Vascular;  Laterality: Right;  . CAPD REMOVAL  05/08/2011   Procedure: CONTINUOUS AMBULATORY PERITONEAL DIALYSIS  (CAPD) CATHETER REMOVAL;  Surgeon: [name], MD;  [facility];  Service: General;  Laterality: N/A;  Removal of CAPD catheter, Dr. requests to go after 100  . INSERTION OF DIALYSIS CATHETER  10/05/10   Right Femoral Cath insertion by Dr. [name].  Pt ahas had several caths inserted.  . IR FLUORO GUIDE CV LINE RIGHT  05/11/2018  . IR US GUIDE VASC ACCESS RIGHT  05/11/2018  . KIDNEY TRANSPLANT  2014  . UPPER EXTREMITY ANGIOGRAPHY Bilateral 05/13/2018   Procedure: UPPER EXTREMITY ANGIOGRAPHY - bilarteral;  Surgeon: Marty Heck, MD;  Location: [facility];  Service: Cardiovascular;  Laterality: Bilateral;   Family History  Family History  Problem Relation Age of Onset  . Diabetes Father   . Stroke Father   . Anesthesia problems Neg Hx    Social History  reports that he has never smoked. He has never used smokeless tobacco. He reports current drug use. Frequency: 5.00 times per week. Drug: Marijuana. He reports that he does not drink alcohol. Allergies No Known Allergies Home medications Prior to Admission medications   Medication  Sig Start Date End Date Taking? Authorizing Provider  acetaminophen (TYLENOL) 500 MG tablet Take 1,000 mg by mouth every 6 (six) hours as needed for mild pain or headache.   Yes [provider]  VELPHORO 500 MG chewable tablet Chew 1,000 mg by mouth 3 (three) times daily with meals. 11/18/18  Yes [provider]  atorvastatin (LIPITOR) 20 MG tablet Take 20 mg by mouth daily. 11/23/18   [provider]  imiquimod (ALDARA) 5 % cream Apply thin layer to affected areas three times per week prior to bed. Patient not taking: Reported on 11/30/2018 11/03/18   Luetta Nutting, DO   Liver Function Tests No results for input(s): AST, ALT, ALKPHOS, BILITOT, PROT, ALBUMIN in the last  168 hours. No results for input(s): LIPASE, AMYLASE in the last 168 hours. CBC Recent Labs  Lab 11/30/18 0855  WBC 11.7*  NEUTROABS 9.7*  HGB 8.1*  HCT 25.7*  MCV 86.8  PLT 916   Basic Metabolic Panel Recent Labs  Lab 11/30/18 0855  NA 136  K 4.8  CL 96*  CO2 23  GLUCOSE 106*  BUN 69*  CREATININE 15.23*  CALCIUM 8.7*  PHOS 6.4*   Iron/TIBC/Ferritin/ %Sat    Component Value Date/Time   IRON 46 05/26/2018   TIBC 202 05/26/2018   FERRITIN 1,155 05/26/2018   IRONPCTSAT 59 (H) 05/11/2018 0235    Vitals:   11/30/18 1115 11/30/18 1130 11/30/18 1145 11/30/18 1209  BP: (!) 194/118 (!) 199/127 (!) 210/127 (!) 206/139  Pulse: (!) 101 (!) 102 99 99  Resp:    (!) 25  Temp:      TempSrc:      SpO2: 100% 100% 100% 100%  Weight:      Height:        Exam Gen obese adult AAM, mild SOB not in distress, coughing up phlegm into a bag No rash, cyanosis or gangrene Sclera anicteric, throat clear  No jvd or bruits Chest bibasilar crackles, no wheezing RRR no MRG Abd soft ntnd no mass or ascites +bs GU normal male MS no joint effusions or deformity Ext 1+ pretib edema, no wounds or ulcers Neuro is alert, Ox 3 , nf    Outpt HD: TTS [place]  4.5h  500/1.5  146kg   2/2.25 bath  Hep 11400   L thigh AVG - parsabiv  - mircera 150 q 2wks - hectorol 4 ug     Assessment/ Plan: 1. SOB/ hypoxemia - pulm edema and possilbe RLL PNA as well. Last HD 4 days ago. Plan HD upstairs then next slot opens up.  O2 support. Will follow.  2. Fever - prob PNA, started on IV abx 3. H/o failed renal Tx 4. ESRD on HD TTS 5. Morbid obesity 6. HTN severe - prn IV meds, home meds 7. DM2      Kelly Splinter  MD 11/30/2018, 1:29 PM

## 2018-11-30 NOTE — ED Notes (Signed)
Spoke with dialysis. Was informed they do not allow intubated patients. Will send someone to his room.

## 2018-11-30 NOTE — ED Notes (Signed)
ED TO INPATIENT HANDOFF REPORT  ED Nurse Name and Phone #: Kathlee Nations [phone]  S Name/Age/Gender [name] 43 y.o. male Room/Bed: 025C/025C  Code Status   Code Status: Full Code  Home/SNF/Other Home Patient oriented to: self, place, time and situation Is this baseline? Yes   Triage Complete: Triage complete  Chief Complaint Low Stats; Fever; Tachy  Triage Note Pt came from dialysis. Unalble toi start d/t fever upon arrival and SOB. Pt hypoxic SpO2  72%. Pt placed on NRB.  HR 122 Temp 100.4 axillary    Allergies No Known Allergies  Level of Care/Admitting Diagnosis ED Disposition    ED Disposition Condition [facility] Area: [place] [100100]  Level of Care: Progressive [102]  Covid Evaluation: Screening Protocol (No Symptoms)  Diagnosis: Sepsis [facility]) [[id number]]  Admitting Physician: Norval Morton [[id number]]  Attending Physician: Norval Morton [[id number]]  Estimated length of stay: past midnight tomorrow  Certification:: I certify this patient will need inpatient services for at least 2 midnights  PT Class (Do Not Modify): Inpatient [101]  PT Acc Code (Do Not Modify): Private [1]       B Medical/Surgery History Past Medical History:  Diagnosis Date  . Anemia    ESRD  . CHF (congestive heart failure) ([place])   . Chronic kidney disease 06/07/2011   Hemodialysis-TTS Last hemo today 05/08/2011  . Diabetes mellitus without complication ([place])   . GERD (gastroesophageal reflux disease)   . Hypertension 111/29/2012   pt states he takes no HTN meds  . Hypothyroidism, secondary   . Morbid obesity ([place])   . Paroxysmal atrial fibrillation ([facility])   . Sleep apnea 06/07/2011   Pt had sllep study done 2 weeks ago- Dr. Joellyn Quails.  Does not  have a CPAP at this time.   Past Surgical History:  Procedure Laterality Date  . ARTERIOVENOUS GRAFT PLACEMENT  08/09/10   Left Thigh Graft by Dr. [name]  . AV FISTULA PLACEMENT Right  05/18/2018   Procedure: INSERTION OF ARTERIOVENOUS (AV) GORE-TEX GRAFT Left THIGH;  Surgeon: Waynetta Sandy, MD;  Location: [facility];  Service: Vascular;  Laterality: Right;  . CAPD REMOVAL  05/08/2011   Procedure: CONTINUOUS AMBULATORY PERITONEAL DIALYSIS  (CAPD) CATHETER REMOVAL;  Surgeon: [name], MD;  Location: [facility] OR;  Service: General;  Laterality: N/A;  Removal of CAPD catheter, Dr. requests to go after 100  . INSERTION OF DIALYSIS CATHETER  10/05/10   Right Femoral Cath insertion by Dr. [name].  Pt ahas had several caths inserted.  . IR FLUORO GUIDE CV LINE RIGHT  05/11/2018  . IR US GUIDE VASC ACCESS RIGHT  05/11/2018  . KIDNEY TRANSPLANT  2014  . UPPER EXTREMITY ANGIOGRAPHY Bilateral 05/13/2018   Procedure: UPPER EXTREMITY ANGIOGRAPHY - bilarteral;  Surgeon: Marty Heck, MD;  Location: [facility];  Service: Cardiovascular;  Laterality: Bilateral;     A IV Location/Drains/Wounds Patient Lines/Drains/Airways Status   Active Line/Drains/Airways    Name:   Placement date:   Placement time:   Site:   Days:   Peripheral IV 11/30/18 Right Hand   11/30/18    0912    Hand   less than 1   Fistula / Graft Left Thigh Arteriovenous vein graft   05/18/18    1044    Thigh   196   Hemodialysis Catheter Right Femoral vein Double-lumen;Permanent   05/11/18    1547    Femoral vein  203   Incision (Closed) 05/18/18 Thigh Left   05/18/18    1045     196          Intake/Output Last 24 hours  Intake/Output Summary (Last 24 hours) at 11/30/2018 1321 Last data filed at 11/30/2018 1300 Gross per 24 hour  Intake 600 ml  Output -  Net 600 ml    Labs/Imaging Results for orders placed or performed during the hospital encounter of 11/30/18 (from the past 48 hour(s))  SARS Coronavirus 2 (CEPHEID- Performed in [facility] [place] hospital lab), Hosp Order     Status: None   Collection Time: 11/30/18  8:35 AM   Specimen: Nasopharyngeal Swab  Result Value Ref Range    SARS Coronavirus 2 NEGATIVE NEGATIVE    Comment: (NOTE) If result is NEGATIVE SARS-CoV-2 target nucleic acids are NOT DETECTED. The SARS-CoV-2 RNA is generally detectable in upper and lower  respiratory specimens during the acute phase of infection. The lowest  concentration of SARS-CoV-2 viral copies this assay can detect is 250  copies / mL. A negative result does not preclude SARS-CoV-2 infection  and should not be used as the sole basis for treatment or other  patient management decisions.  A negative result may occur with  improper specimen collection / handling, submission of specimen other  than nasopharyngeal swab, presence of viral mutation(s) within the  areas targeted by this assay, and inadequate number of viral copies  (<250 copies / mL). A negative result must be combined with clinical  observations, patient history, and epidemiological information. If result is POSITIVE SARS-CoV-2 target nucleic acids are DETECTED. The SARS-CoV-2 RNA is generally detectable in upper and lower  respiratory specimens dur ing the acute phase of infection.  Positive  results are indicative of active infection with SARS-CoV-2.  Clinical  correlation with patient history and other diagnostic information is  necessary to determine patient infection status.  Positive results do  not rule out bacterial infection or co-infection with other viruses. If result is PRESUMPTIVE POSTIVE SARS-CoV-2 nucleic acids MAY BE PRESENT.   A presumptive positive result was obtained on the submitted specimen  and confirmed on repeat testing.  While 2019 novel coronavirus  (SARS-CoV-2) nucleic acids may be present in the submitted sample  additional confirmatory testing may be necessary for epidemiological  and / or clinical management purposes  to differentiate between  SARS-CoV-2 and other Sarbecovirus currently known to infect humans.  If clinically indicated additional testing with an alternate test   methodology [phone]) is advised. The SARS-CoV-2 RNA is generally  detectable in upper and lower respiratory sp ecimens during the acute  phase of infection. The expected result is Negative. Fact Sheet for Patients:  StrictlyIdeas.no Fact Sheet for Healthcare Providers: BankingDealers.co.za This test is not yet approved or cleared by the [place] FDA and has been authorized for detection and/or diagnosis of SARS-CoV-2 by FDA under an Emergency Use Authorization (EUA).  This EUA will remain in effect (meaning this test can be used) for the duration of the COVID-19 declaration under Section 564(b)(1) of the Act, 21 U.S.C. section 360bbb-3(b)(1), unless the authorization is terminated or revoked sooner. Performed at [facility], [place] [address], [place], [place] [address]   Basic metabolic panel     Status: Abnormal   Collection Time: 11/30/18  8:55 AM  Result Value Ref Range   Sodium 136 135 - 145 mmol/L   Potassium 4.8 3.5 - 5.1 mmol/L   Chloride 96 (L) 98 -  111 mmol/L   CO2 23 22 - 32 mmol/L   Glucose, Bld 106 (H) 70 - 99 mg/dL   BUN 69 (H) 6 - 20 mg/dL   Creatinine, Ser 15.23 (H) 0.61 - 1.24 mg/dL   Calcium 8.7 (L) 8.9 - 10.3 mg/dL   GFR calc non Af Amer 3 (L) >60 mL/min   GFR calc Af Amer 4 (L) >60 mL/min   Anion gap 17 (H) 5 - 15    Comment: Performed at [facility] [address] [place], [place] [address]  CBC with Differential     Status: Abnormal   Collection Time: 11/30/18  8:55 AM  Result Value Ref Range   WBC 11.7 (H) 4.0 - 10.5 K/uL   RBC 2.96 (L) 4.22 - 5.81 MIL/uL   Hemoglobin 8.1 (L) 13.0 - 17.0 g/dL   HCT 25.7 (L) 39.0 - 52.0 %   MCV 86.8 80.0 - 100.0 fL   MCH 27.4 26.0 - 34.0 pg   MCHC 31.5 30.0 - 36.0 g/dL   RDW 16.6 (H) 11.5 - 15.5 %   Platelets 327 150 - 400 K/uL   nRBC 0.0 0.0 - 0.2 %   Neutrophils Relative % 82 %   Neutro Abs 9.7 (H) 1.7 - 7.7 K/uL   Lymphocytes Relative 6 %    Lymphs Abs 0.7 0.7 - 4.0 K/uL   Monocytes Relative 9 %   Monocytes Absolute 1.0 0.1 - 1.0 K/uL   Eosinophils Relative 1 %   Eosinophils Absolute 0.1 0.0 - 0.5 K/uL   Basophils Relative 1 %   Basophils Absolute 0.1 0.0 - 0.1 K/uL   Immature Granulocytes 1 %   Abs Immature Granulocytes 0.10 (H) 0.00 - 0.07 K/uL    Comment: Performed at [facility] [address], [place], [place] [address]  Magnesium     Status: None   Collection Time: 11/30/18  8:55 AM  Result Value Ref Range   Magnesium 2.4 1.7 - 2.4 mg/dL    Comment: Performed at [facility] [address], [place], [place] [address]  Phosphorus     Status: Abnormal   Collection Time: 11/30/18  8:55 AM  Result Value Ref Range   Phosphorus 6.4 (H) 2.5 - 4.6 mg/dL    Comment: Performed at [facility] [address], [place], [name] [address]  Lactic acid, plasma     Status: None   Collection Time: 11/30/18  8:55 AM  Result Value Ref Range   Lactic Acid, Venous 1.2 0.5 - 1.9 mmol/L    Comment: Performed at [facility] [address] [place], [place] [address]   Dg Chest Portable 1 View  Result Date: 11/30/2018 CLINICAL DATA:  Fever and hypoxia EXAM: PORTABLE CHEST 1 VIEW COMPARISON:  May 10, 2018 FINDINGS: There is airspace consolidation in the right base region. There is more subtle airspace opacity in the left upper lobe. Heart is enlarged with mild pulmonary venous hypertension. No adenopathy. There is aortic atherosclerosis. No bone lesions. IMPRESSION: Areas of airspace opacity felt to represent pneumonia in the right base and left upper lobe regions. Consolidation is greatest in the right base region. There is cardiomegaly with a degree of pulmonary vascular congestion. No adenopathy evident. Aortic Atherosclerosis (ICD10-I70.0). Electronically Signed   By: Lowella Grip III M.D.   On: 11/30/2018 09:09    Pending Labs [employer] (From admission, onward)    Start     Ordered  12/01/18 0500  CBC  Tomorrow morning,   R     11/30/18 1315   12/01/18 0500  Renal function panel  Tomorrow morning,   R     11/30/18 1315   11/30/18 1319  Procalcitonin  Add-on,   AD     11/30/18 1318   11/30/18 1315  Strep pneumoniae urinary antigen  Once,   STAT     11/30/18 1315   11/30/18 1315  Legionella Pneumophila Serogp 1 Ur Ag  Once,   STAT     11/30/18 1315   11/30/18 1315  Culture, sputum-assessment  Once,   R     11/30/18 1315   11/30/18 0833  Lactic acid, plasma  Now then every 2 hours,   STAT     11/30/18 0833   11/30/18 0833  Blood culture (routine x 2)  BLOOD CULTURE X 2,   STAT     11/30/18 0833          Vitals/Pain Today's Vitals   11/30/18 1130 11/30/18 1145 11/30/18 1209 11/30/18 1210  BP: (!) 199/127 (!) 210/127 (!) 206/139   Pulse: (!) 102 99 99   Resp:   (!) 25   Temp:      TempSrc:      SpO2: 100% 100% 100%   Weight:      Height:      PainSc:   0-No pain 0-No pain    Isolation Precautions Droplet and Contact precautions  Medications Medications  vancomycin (VANCOCIN) IVPB 1000 mg/200 mL premix (has no administration in time range)  ceFEPIme (MAXIPIME) 2 g in sodium chloride 0.9 % 100 mL IVPB (has no administration in time range)  atorvastatin (LIPITOR) tablet 20 mg (has no administration in time range)  sucroferric oxyhydroxide (VELPHORO) chewable tablet 1,000 mg (has no administration in time range)  heparin injection 5,000 Units (has no administration in time range)  ondansetron (ZOFRAN) tablet 4 mg (has no administration in time range)    Or  ondansetron (ZOFRAN) injection 4 mg (has no administration in time range)  acetaminophen (TYLENOL) tablet 650 mg (has no administration in time range)    Or  acetaminophen (TYLENOL) suppository 650 mg (has no administration in time range)  ipratropium-albuterol (DUONEB) 0.5-2.5 (3) MG/3ML nebulizer solution 3 mL (has no administration in time range)  nitroGLYCERIN (NITROGLYN) 2 % ointment 0.5 inch  (has no administration in time range)  hydrALAZINE (APRESOLINE) injection 10 mg (has no administration in time range)  guaiFENesin (MUCINEX) 12 hr tablet 600 mg (has no administration in time range)  acetaminophen (TYLENOL) tablet 1,000 mg (1,000 mg Oral Given 11/30/18 0907)  albuterol (VENTOLIN HFA) 108 (90 Base) MCG/ACT inhaler 2 puff (2 puffs Inhalation Given 11/30/18 0854)  vancomycin (VANCOCIN) 2,500 mg in sodium chloride 0.9 % 500 mL IVPB (0 mg Intravenous Stopped 11/30/18 1300)  ceFEPIme (MAXIPIME) 2 g in sodium chloride 0.9 % 100 mL IVPB (0 g Intravenous Stopped 11/30/18 0953)  ondansetron (ZOFRAN) injection 4 mg (4 mg Intravenous Given 11/30/18 1311)    Mobility walks Low fall risk   Focused Assessments Renal Assessment Handoff:  Hemodialysis Schedule: Hemodialysis Schedule: Tuesday/Thursday/Saturday Last Hemodialysis date and time: Friday   Restricted appendage: left leg     R Recommendations: See Admitting Provider Note  Report given to:   Additional Notes:

## 2018-11-30 NOTE — Procedures (Signed)
Central Venous Catheter Insertion Procedure Note [name] [id number] [birth year]  Procedure: Insertion of Central Venous Catheter Indications: Assessment of intravascular volume and Drug and/or fluid administration  Procedure Details Consent: Unable to obtain consent because of emergent medical necessity. Time Out: Verified patient identification, verified procedure, site/side was marked, verified correct patient position, special equipment/implants available, medications/allergies/relevent history reviewed, required imaging and test results available.  Performed  Maximum sterile technique was used including antiseptics, cap, gloves, gown, hand hygiene, mask and sheet. Skin prep: Chlorhexidine; local anesthetic administered  Attempted Left IJ. Although wire was visualized in vessel, unable to pass wire. R femoral was also attempted however unable to pass wire.  [name] 11/30/2018, 6:16 PM

## 2018-11-30 NOTE — Procedures (Signed)
Central Venous Catheter Insertion Procedure Note [name] [id number] [birth year]  Procedure: Insertion of Central Venous Catheter Indications: Assessment of intravascular volume, Drug and/or fluid administration and Frequent blood sampling  Procedure Details Consent: Unable to obtain consent because of emergent medical necessity. Time Out: Verified patient identification, verified procedure, site/side was marked, verified correct patient position, special equipment/implants available, medications/allergies/relevent history reviewed, required imaging and test results available.  Performed  Maximum sterile technique was used including antiseptics, cap, gloves, gown, hand hygiene, mask and sheet. Skin prep: Chlorhexidine; local anesthetic administered A antimicrobial bonded/coated triple lumen catheter was placed in the left subclavian vein using the Seldinger technique.  Evaluation Blood flow good Complications: No apparent complications Patient did tolerate procedure well. Chest X-ray ordered to verify placement.  CXR: pending.  U/S used in placement.  Attempted right femoral without success and Dr. Loanne Drilling attempted L IJ central and right femoral without success.  [name] 11/30/2018, 6:19 PM

## 2018-11-30 NOTE — Progress Notes (Signed)
eLink Physician-Brief Progress Note Patient Name: [name] DOB: Feb 06, 1976 MRN: [id number]   Date of Service  11/30/2018  HPI/Events of Note  Hypotension on dialysis  eICU Interventions  Add Vasopressin infusion, Albumin 25 % 25 gm iv bolus x 1, titrate down dose of Propofol infusion        Fayez Sturgell U Ibraham Levi 11/30/2018, 8:45 PM

## 2018-11-30 NOTE — H&P (Signed)
History and Physical    [name] JYN:[id number] DOB: Mar 16, 1976 DOA: 11/30/2018  Referring MD/NP/PA: Wilson Singer PCP: Luetta Nutting, DO  Patient coming from: Via EMS  Chief Complaint: Shortness of breath  I have personally briefly reviewed patient's old medical records in [place]   HPI: [name] is a 43 y.o. male with medical history significant of ESRD on HD, s/p kidney transplant and 10/2012 followed at [facility], poorly controlled hypertension, DM type II, CHF, morbid obesity, OSA on CPAP, and GERD; who presented with complaints of shortness of breath for last day.  He has been coughing up thick sputum.  He reports missing hemodialysis yesterday.  Other associated symptoms include chest discomfort secondary to coughing.  Denies any note of any recent sick contacts to his knowledge.  He is coughing so much that it is causing him to vomit.  Emesis is noted to be nonbloody in appearance.  History is limited due to respiratory distress.   ED Course: Upon admission to the emergency department patient was noted to be febrile up to 100.4 F, tachycardic, tachypneic, blood pressures elevated to 214/122, and O2 saturations as low as 72% improving to 100% on BiPAP.  WBC 11.7, hemoglobin 8.1, potassium 4.8, BUN 69, creatinine 15.23, lactic acid 1.2 COVID-19 screening negative.  Chest x-ray showed areas of aspects opacity thought to represent pneumonia in the right base and left upper lobes as well as cardiomegaly with pulmonary vascular congestion.  Patient reported inability to tolerate BiPAP removed it and has been vomiting.  Review of Systems  Unable to perform ROS: Severe respiratory distress  Respiratory: Positive for cough, sputum production and shortness of breath.   Gastrointestinal: Positive for vomiting.    Past Medical History:  Diagnosis Date   Anemia    ESRD   CHF (congestive heart failure) ([facility])    Chronic kidney disease 06/07/2011   Hemodialysis-TTS Last hemo  today 05/08/2011   Diabetes mellitus without complication (HCC)    GERD (gastroesophageal reflux disease)    Hypertension 111/29/2012   pt states he takes no HTN meds   Hypothyroidism, secondary    Morbid obesity (HCC)    Paroxysmal atrial fibrillation (HCC)    Sleep apnea 06/07/2011   Pt had sllep study done 2 weeks ago- Dr. Joellyn Quails.  Does not  have a CPAP at this time.    Past Surgical History:  Procedure Laterality Date   ARTERIOVENOUS GRAFT PLACEMENT  08/09/10   Left Thigh Graft by Dr. [name]   AV FISTULA PLACEMENT Right 05/18/2018   Procedure: INSERTION OF ARTERIOVENOUS (AV) GORE-TEX GRAFT Left THIGH;  Surgeon: Waynetta Sandy, MD;  Location: [facility];  Service: Vascular;  Laterality: Right;   CAPD REMOVAL  05/08/2011   Procedure: CONTINUOUS AMBULATORY PERITONEAL DIALYSIS  (CAPD) CATHETER REMOVAL;  Surgeon: [name], MD;  Location: [facility];  Service: General;  Laterality: N/A;  Removal of CAPD catheter, Dr. requests to go after 100   [place]  10/05/10   Right Femoral Cath insertion by Dr. [name].  Pt ahas had several caths inserted.   IR FLUORO GUIDE CV LINE RIGHT  05/11/2018   IR US GUIDE VASC ACCESS RIGHT  05/11/2018   KIDNEY TRANSPLANT  2014   UPPER EXTREMITY ANGIOGRAPHY Bilateral 05/13/2018   Procedure: UPPER EXTREMITY ANGIOGRAPHY - bilarteral;  Surgeon: Marty Heck, MD;  Location: [facility];  Service: Cardiovascular;  Laterality: Bilateral;     reports that he has never  smoked. He has never used smokeless tobacco. He reports current drug use. Frequency: 5.00 times per week. Drug: Marijuana. He reports that he does not drink alcohol.  No Known Allergies  Family History  Problem Relation Age of Onset   Diabetes Father    Stroke Father    Anesthesia problems Neg Hx     Prior to Admission medications   Medication Sig Start Date End Date Taking? Authorizing Provider  acetaminophen  (TYLENOL) 500 MG tablet Take 1,000 mg by mouth every 6 (six) hours as needed for mild pain or headache.   Yes [provider]  VELPHORO 500 MG chewable tablet Chew 1,000 mg by mouth 3 (three) times daily with meals. 11/18/18  Yes [provider]  atorvastatin (LIPITOR) 20 MG tablet Take 20 mg by mouth daily. 11/23/18   [provider]  imiquimod (ALDARA) 5 % cream Apply thin layer to affected areas three times per week prior to bed. Patient not taking: Reported on 11/30/2018 11/03/18   Luetta Nutting, DO    Physical Exam:  Constitutional: Middle-aged male who appears to be in significant respiratory distress Vitals:   11/30/18 1100 11/30/18 1115 11/30/18 1130 11/30/18 1209  BP: (!) 214/122 (!) 194/118 (!) 199/127 (!) 206/139  Pulse: (!) 102 (!) 101 (!) 102 99  Resp:    (!) 25  Temp:      TempSrc:      SpO2: 100% 100% 100% 100%  Weight:      Height:       Eyes: PERRL, lids and conjunctivae normal ENMT: Mucous membranes are moist. Posterior pharynx clear of any exudate or lesions.  Neck: normal, supple, no masses, no thyromegaly Respiratory: Tachypneic with rhonchi appreciated in both lung fields.  Accessory muscle usage noted.  Patient using accessory muscle use currently on 10L of nasal cannula oxygen Cardiovascular: Tachycardic, no murmurs / rubs / gallops.  Trace bilateral lower extremity edema. 2+ pedal pulses. No carotid bruits.   Abdomen: no tenderness, no masses palpated. No hepatosplenomegaly. Bowel sounds positive.  Musculoskeletal: No joint deformity upper and lower extremities. Good ROM, no contractures. Normal muscle tone.  Skin: no rashes, lesions, ulcers. No induration Neurologic: CN 2-12 grossly intact. Sensation intact, DTR normal. Strength 5/5 in all 4.  Psychiatric: Normal judgment and insight. Alert and oriented x 3.  Anxious mood.     Labs on Admission: I have personally reviewed following labs and imaging studies  CBC: Recent Labs  Lab  11/30/18 0855  WBC 11.7*  NEUTROABS 9.7*  HGB 8.1*  HCT 25.7*  MCV 86.8  PLT 539   Basic Metabolic Panel: Recent Labs  Lab 11/30/18 0855  NA 136  K 4.8  CL 96*  CO2 23  GLUCOSE 106*  BUN 69*  CREATININE 15.23*  CALCIUM 8.7*  MG 2.4  PHOS 6.4*   GFR: Estimated Creatinine Clearance: 9.5 mL/min (A) (by C-G formula based on SCr of 15.23 mg/dL (H)). Liver Function Tests: No results for input(s): AST, ALT, ALKPHOS, BILITOT, PROT, ALBUMIN in the last 168 hours. No results for input(s): LIPASE, AMYLASE in the last 168 hours. No results for input(s): AMMONIA in the last 168 hours. Coagulation Profile: No results for input(s): INR, PROTIME in the last 168 hours. Cardiac Enzymes: No results for input(s): CKTOTAL, CKMB, CKMBINDEX, TROPONINI in the last 168 hours. BNP (last 3 results) No results for input(s): PROBNP in the last 8760 hours. HbA1C: No results for input(s): HGBA1C in the last 72 hours. CBG: No results for  input(s): GLUCAP in the last 168 hours. Lipid Profile: No results for input(s): CHOL, HDL, LDLCALC, TRIG, CHOLHDL, LDLDIRECT in the last 72 hours. Thyroid Function Tests: No results for input(s): TSH, T4TOTAL, FREET4, T3FREE, THYROIDAB in the last 72 hours. Anemia Panel: No results for input(s): VITAMINB12, FOLATE, FERRITIN, TIBC, IRON, RETICCTPCT in the last 72 hours. Urine analysis:    Component Value Date/Time   COLORURINE STRAW (A) 05/10/2018 1811   APPEARANCEUR CLEAR 05/10/2018 1811   LABSPEC 1.012 05/10/2018 1811   PHURINE 7.0 05/10/2018 1811   GLUCOSEU 150 (A) 05/10/2018 1811   HGBUR SMALL (A) 05/10/2018 1811   BILIRUBINUR NEGATIVE 11/03/2018 1515   KETONESUR NEGATIVE 05/10/2018 1811   PROTEINUR Positive (A) 11/03/2018 1515   PROTEINUR >=300 (A) 05/10/2018 1811   UROBILINOGEN 0.2 11/03/2018 1515   NITRITE NEGATIVE 11/03/2018 1515   NITRITE NEGATIVE 05/10/2018 1811   LEUKOCYTESUR Small (1+) (A) 11/03/2018 1515   Sepsis Labs: Recent Results  (from the past 240 hour(s))  SARS Coronavirus 2 (CEPHEID- Performed in [place] hospital lab), Hosp Order     Status: None   Collection Time: 11/30/18  8:35 AM   Specimen: Nasopharyngeal Swab  Result Value Ref Range Status   SARS Coronavirus 2 NEGATIVE NEGATIVE Final    Comment: (NOTE) If result is NEGATIVE SARS-CoV-2 target nucleic acids are NOT DETECTED. The SARS-CoV-2 RNA is generally detectable in upper and lower  respiratory specimens during the acute phase of infection. The lowest  concentration of SARS-CoV-2 viral copies this assay can detect is 250  copies / mL. A negative result does not preclude SARS-CoV-2 infection  and should not be used as the sole basis for treatment or other  patient management decisions.  A negative result may occur with  improper specimen collection / handling, submission of specimen other  than nasopharyngeal swab, presence of viral mutation(s) within the  areas targeted by this assay, and inadequate number of viral copies  (<250 copies / mL). A negative result must be combined with clinical  observations, patient history, and epidemiological information. If result is POSITIVE SARS-CoV-2 target nucleic acids are DETECTED. The SARS-CoV-2 RNA is generally detectable in upper and lower  respiratory specimens dur ing the acute phase of infection.  Positive  results are indicative of active infection with SARS-CoV-2.  Clinical  correlation with patient history and other diagnostic information is  necessary to determine patient infection status.  Positive results do  not rule out bacterial infection or co-infection with other viruses. If result is PRESUMPTIVE POSTIVE SARS-CoV-2 nucleic acids MAY BE PRESENT.   A presumptive positive result was obtained on the submitted specimen  and confirmed on repeat testing.  While 2019 novel coronavirus  (SARS-CoV-2) nucleic acids may be present in the submitted sample  additional confirmatory testing may be  necessary for epidemiological  and / or clinical management purposes  to differentiate between  SARS-CoV-2 and other Sarbecovirus currently known to infect humans.  If clinically indicated additional testing with an alternate test  methodology [phone]) is advised. The SARS-CoV-2 RNA is generally  detectable in upper and lower respiratory sp ecimens during the acute  phase of infection. The expected result is Negative. Fact Sheet for Patients:  StrictlyIdeas.no Fact Sheet for Healthcare Providers: BankingDealers.co.za This test is not yet approved or cleared by the [place] FDA and has been authorized for detection and/or diagnosis of SARS-CoV-2 by FDA under an Emergency Use Authorization (EUA).  This EUA will remain in effect (meaning this test can be  used) for the duration of the COVID-19 declaration under Section 564(b)(1) of the Act, 21 U.S.C. section 360bbb-3(b)(1), unless the authorization is terminated or revoked sooner. Performed at [facility], [place] [address] [place], [place] [address]      Radiological Exams on Admission: Dg Chest Portable 1 View  Result Date: 11/30/2018 CLINICAL DATA:  Fever and hypoxia EXAM: PORTABLE CHEST 1 VIEW COMPARISON:  May 10, 2018 FINDINGS: There is airspace consolidation in the right base region. There is more subtle airspace opacity in the left upper lobe. Heart is enlarged with mild pulmonary venous hypertension. No adenopathy. There is aortic atherosclerosis. No bone lesions. IMPRESSION: Areas of airspace opacity felt to represent pneumonia in the right base and left upper lobe regions. Consolidation is greatest in the right base region. There is cardiomegaly with a degree of pulmonary vascular congestion. No adenopathy evident. Aortic Atherosclerosis (ICD10-I70.0). Electronically Signed   By: Lowella Grip III M.D.   On: 11/30/2018 09:09    EKG: Independently reviewed.  Sinus  tachycardia 122 bpm.  Assessment/Plan Sepsis secondary to community-acquired pneumonia: Patient presents with progressively worsening shortness of breath and cough.  Found to be febrile up to 104 F with tachycardia and tachypnea.  WBC 11.7 and lactic acid reassuring at 1.2.  Chest x-ray showing airspace opacities in the right lower lung base and left upper lobe concerning for pneumonia.   -Admit to a progressive bed -Follow-up sputum and blood cultures -Add on procalcitonin -Continue empiric antibiotics of vancomycin and cefepime for now  -Albuterol nebs as needed for shortness of breath/wheezing -Mucinex   Acute respiratory failure with hypoxia: O2 saturations noted to be as low as 72% on nasal cannula oxygen.  Patient was initially placed on BiPAP, but did not tolerate this well.   of patient being fluid overloaded given recently missed hemodialysis session. -Continuous pulse oximetry with nasal cannula oxygen as needed -DuoNebs every 4 hours as needed for shortness of breath/wheezing -Respiratory therapy consult -PCCM consulted due to respiratory distress as O2 saturations into the 70s despite being on nonrebreather currently. . ESRD on HD: Patient normally dialyzes Monday, Wednesday, and Friday.  Last received dialysis on 6/19.  Potassium 4.8, BUN 69, creatinine 15.23. -Dr. Jonnie Finner of nephrology consulted for need of dialysis   Hypertensive urgency: Blood pressures elevated up to 214/122 on admission. -Hydralazine IV prn elevated blood pressures     DVT prophylaxis: Heparin Code Status: Full Family Communication: No family present at bedside. Disposition Plan: Possible discharge home in 2 to 3 days Consults called: Nephrology Admission status: Inpatient  Norval Morton MD [facility] Pager [phone]   If 7PM-7AM, please contact night-coverage [email or web address] Password [facility]  11/30/2018, 12:46 PM

## 2018-11-30 NOTE — ED Notes (Signed)
Contacted Dr Melvia Heaps concerning pt SpO2 in the 75-80. Made aware change (drop).in SpO2 . MD stated pt is next on list for HD.   This RN contacted Dr. Tamala Julian to make aware of pt SpO2 decline

## 2018-11-30 NOTE — Progress Notes (Signed)
Renal Navigator notified OP HD unit/SW of patient's admission to ICU after intubation and negative COVID 19 rapid test result in order to provide continuity of care.  Alphonzo Cruise, [place] Renal Navigator [phone]

## 2018-11-30 NOTE — ED Triage Notes (Signed)
Pt came from dialysis. Unalble toi start d/t fever upon arrival and SOB. Pt hypoxic SpO2  72%. Pt placed on NRB.  HR 122 Temp 100.4 axillary

## 2018-11-30 NOTE — Procedures (Signed)
Arterial Catheter Insertion Procedure Note [name] [name] [id number] 01-07-1976  Procedure: Insertion of Arterial Catheter  Indications: Blood pressure monitoring and Frequent blood sampling  Procedure Details Consent: Unable to obtain consent because of emergent medical necessity. Time Out: Verified patient identification, verified procedure, site/side was marked, verified correct patient position, special equipment/implants available, medications/allergies/relevent history reviewed, required imaging and test results available.  Performed  Maximum sterile technique was used including antiseptics, cap, gloves, gown, hand hygiene, mask and sheet. Skin prep: Chlorhexidine; local anesthetic administered 20 gauge catheter was inserted into right radial artery using the Seldinger technique. ULTRASOUND GUIDANCE USED: NO Evaluation Blood flow good; BP tracing good. Complications: No apparent complications.   [name] 11/30/2018

## 2018-11-30 NOTE — Progress Notes (Signed)
Pharmacy Antibiotic Note  [name] is a 43 y.o. male admitted on 11/30/2018 with SOB and fever.  Patient arrived at HD center and then transferred to [facility] before starting HD session.  Pharmacy has been consulted for vancomycin and cefepime dosing for sepsis.  Tmax 100.4.  Plan: Vanc 2500mg  IV x 1, then 1gm IV qHD TTS Cefepime 2gm IV qHD TTS Monitor HD schedule/tolerance, clinical progress, vanc level as indicated  Height: 6\' 2"  (188 cm) Weight: (!) 325 lb (147.4 kg) IBW/kg (Calculated) : 82.2  Temp (24hrs), Avg:100.4 F (38 C), Min:100.4 F (38 C), Max:100.4 F (38 C)  No results for input(s): WBC, CREATININE, LATICACIDVEN, VANCOTROUGH, VANCOPEAK, VANCORANDOM, GENTTROUGH, GENTPEAK, GENTRANDOM, TOBRATROUGH, TOBRAPEAK, TOBRARND, AMIKACINPEAK, AMIKACINTROU, AMIKACIN in the last 168 hours.  CrCl cannot be calculated (Patient's most recent lab result is older than the maximum 21 days allowed.).    No Known Allergies  Vanc 6/23 >> Cefepime 6/23 >>  6/23 covid -  6/23 BCx -   Shakthi Scipio D. Mina Marble, PharmD, BCPS, [place] 11/30/2018, 8:46 AM

## 2018-11-30 NOTE — Progress Notes (Addendum)
Increased PC from 15 to 22 to achieve 8cc.  Spoke to Dr. Lucile Shutters about settings and he is ok with this settings. Verbal order to change set I:E ratio to 1:1.5. recheck an ABG in 30 minutes and if no improvement in Pa02 to change I:E ratio to 1:1.2. will recheck gas and make changes if needed.

## 2018-11-30 NOTE — Procedures (Signed)
Intubation Procedure Note [name] [id number] Feb 02, 1976  Procedure: Intubation Indications: Respiratory insufficiency  Procedure Details Consent: Unable to obtain consent because of emergent medical necessity. Time Out: Verified patient identification, verified procedure, site/side was marked, verified correct patient position, special equipment/implants available, medications/allergies/relevent history reviewed, required imaging and test results available.  Performed  Maximum sterile technique was used including gloves, hand hygiene and mask.  MAC    Evaluation Hemodynamic Status: BP stable throughout; O2 sats: stable throughout Patient's Current Condition: stable Complications: No apparent complications Patient did tolerate procedure well. Chest X-ray ordered to verify placement.  CXR: pending.   [name] 11/30/2018

## 2018-11-30 NOTE — Progress Notes (Signed)
Helping ED RT, pt transported to ICU via vent w/ no apparent resp. Complications.  UNIT RT aware.

## 2018-11-30 NOTE — Consult Note (Addendum)
NAME:  [name], MRN:  [id number], DOB:  1975-12-23, LOS: 0 ADMISSION DATE:  11/30/2018, CONSULTATION DATE:  11/30/18 REFERRING MD:  Wilson Singer  CHIEF COMPLAINT:  SOB   Brief History   [name] is a 43 y.o. male who presented to [facility] ED 6/23 with respiratory distress and hypoxic respiratory failure presumed due to acute pulmonary edema after missing HD. Also possible HCAP. Required intubation in ED due to extremis.  History of present illness   Pt is encephelopathic; therefore, this HPI is obtained from chart review. [name] is a 43 y.o. male who has a PMH including but not limited to ESRD on HD TTS after kidney transplant failure (transplanted May 2014 and failed Dec 2019 - was being followed at [facility]), HTN, DM, CHF, morbid obesity, OSA on CPAP (see "past medical history" for rest).  He presented to [facility] ED 6/23 with SOB x 1 day.  He had missed HD on 6/18 so had a session 6/19 then [place] the beach.  Went for his scheduled session 6/23 and was found to have mild fever (100.4) along with worsening SOB; therefore, was sent to ED for further evaluation.   In ED, CXR prior to intubation demonstrated vascular congestion and possible basilar infiltrate.  He was in extremis and failed BiPAP due to vomiting.  He required emergent intubation due to desaturations as low as 50's.  No recent fevers/chills/sweats, chest pain, myalgias, exposures to known sick contacts.  COVID negative.   Past Medical History  OSA on CPAP, PAF, HTN, combined CHF (echo from 2019 with EF 55-60%, G2DD), ESRD on HD s/p renal transplant 2014, DM, hypothyroidism.  Significant Hospital Events   6/23 > admit.  Consults:  Nephrology.  Procedures:  ETT 6/23 >   Significant Diagnostic Tests:  CXR 6/23 > vascular congestion, basilar consolidation R > L.  Micro Data:  Blood 6/23 >  Sputum 6/23 >  COVID 6/23 > negative.  Antimicrobials:  Vanc 6/23 >  Cefepime 6/23 >   Interim history/subjective:  Just  intubated.  Objective:  Blood pressure (!) 169/130, pulse (!) 169, temperature 99.2 F (37.3 C), temperature source Axillary, resp. rate (!) 40, height 6\' 2"  (1.88 m), weight (!) 147.4 kg, SpO2 (!) 78 %.    FiO2 (%):  [100 %] 100 %   Intake/Output Summary (Last 24 hours) at 11/30/2018 1502 Last data filed at 11/30/2018 1300 Gross per 24 hour  Intake 600 ml  Output -  Net 600 ml   Filed Weights   11/30/18 0826  Weight: (!) 147.4 kg    Examination: General: Morbidly obese male, just intubated. Neuro: Sedated. HEENT: [place]/AT. Sclerae anicteric.  ETT In place. Cardiovascular: Tachy, regular, no M/R/G.  Lungs: Respirations even and unlabored on vent.  Coarse crackles bilaterally. Abdomen: Obese, BS x 4, soft, NT/ND.  Musculoskeletal: No gross deformities, no edema.  Skin: Intact, warm, no rashes.  Assessment & Plan:   Acute hypoxic respiratory failure requiring intubation - 2/2 acute pulmonary edema in setting of missed HD.  - Full vent support. - Volume removal per HD. - SBT in AM for hopeful extubation. - Bronchial hygiene. - Follow CXR.  Probable HCAP. - Assess blood and sputum cultures. - Empiric vanc / cefepime for now.  ESRD on HD after failed renal transplant. - Nephrology following.  Hx Hypertension. - Hydralazine PRN.  Anemia of chronic disease. - Transfuse for Hgb < 7.  Hx DM. - SSI.  Best Practice:  Diet: NPO.  Pain/Anxiety/Delirium protocol (if indicated): Propofol gtt / Fentanyl gtt.  RASS goal -1. VAP protocol (if indicated): In place. DVT prophylaxis: SCD's / Heparin. GI prophylaxis: PPI. Glucose control: SSI. Mobility: Bedrest. Code Status: Full. Family Communication: Family updated by ED RN just after intubation. Disposition: ICU.  Labs   CBC: Recent Labs  Lab 11/30/18 0855  WBC 11.7*  NEUTROABS 9.7*  HGB 8.1*  HCT 25.7*  MCV 86.8  PLT 417   Basic Metabolic Panel: Recent Labs  Lab 11/30/18 0855  NA 136  K 4.8  CL 96*  CO2 23   GLUCOSE 106*  BUN 69*  CREATININE 15.23*  CALCIUM 8.7*  MG 2.4  PHOS 6.4*   GFR: Estimated Creatinine Clearance: 9.5 mL/min (A) (by C-G formula based on SCr of 15.23 mg/dL (H)). Recent Labs  Lab 11/30/18 0855  WBC 11.7*  LATICACIDVEN 1.2   Liver Function Tests: No results for input(s): AST, ALT, ALKPHOS, BILITOT, PROT, ALBUMIN in the last 168 hours. No results for input(s): LIPASE, AMYLASE in the last 168 hours. No results for input(s): AMMONIA in the last 168 hours. ABG    Component Value Date/Time   TCO2 30 07/22/2010 0624    Coagulation Profile: No results for input(s): INR, PROTIME in the last 168 hours. Cardiac Enzymes: No results for input(s): CKTOTAL, CKMB, CKMBINDEX, TROPONINI in the last 168 hours. HbA1C: Hgb A1c MFr Bld  Date/Time Value Ref Range Status  05/11/2018 02:35 AM 5.8 (H) 4.8 - 5.6 % Final    Comment:    (NOTE) Pre diabetes:          5.7%-6.4% Diabetes:              >6.4% Glycemic control for   <7.0% adults with diabetes   01/25/2018 08:51 AM 6.3 4.6 - 6.5 % Final    Comment:    Glycemic Control Guidelines for People with Diabetes:Non Diabetic:  <6%Goal of Therapy: <7%Additional Action Suggested:  >8%    CBG: No results for input(s): GLUCAP in the last 168 hours.  Review of Systems:   Unable to obtain as pt is encephalopathic / sedated.  Past medical history  He,  has a past medical history of Anemia, CHF (congestive heart failure) ([place]), Diabetes mellitus without complication ([place]), ESRD on hemodialysis ([place]) (06/07/2011), GERD (gastroesophageal reflux disease), Hypertension (111/29/2012), Hypothyroidism, secondary, Morbid obesity ([place]), Paroxysmal atrial fibrillation ([place]), and Sleep apnea (06/07/2011).   Surgical History    Past Surgical History:  Procedure Laterality Date  . ARTERIOVENOUS GRAFT PLACEMENT  08/09/10   Left Thigh Graft by Dr. [name]  . AV FISTULA PLACEMENT Right 05/18/2018   Procedure: INSERTION OF ARTERIOVENOUS  (AV) GORE-TEX GRAFT Left THIGH;  Surgeon: Waynetta Sandy, MD;  Location: [facility];  Service: Vascular;  Laterality: Right;  . CAPD REMOVAL  05/08/2011   Procedure: CONTINUOUS AMBULATORY PERITONEAL DIALYSIS  (CAPD) CATHETER REMOVAL;  Surgeon: [name], MD;  Location: [facility];  Service: General;  Laterality: N/A;  Removal of CAPD catheter, Dr. requests to go after 100  . INSERTION OF DIALYSIS CATHETER  10/05/10   Right Femoral Cath insertion by Dr. [name].  Pt ahas had several caths inserted.  . IR FLUORO GUIDE CV LINE RIGHT  05/11/2018  . IR US GUIDE VASC ACCESS RIGHT  05/11/2018  . KIDNEY TRANSPLANT  2014  . UPPER EXTREMITY ANGIOGRAPHY Bilateral 05/13/2018   Procedure: UPPER EXTREMITY ANGIOGRAPHY - bilarteral;  Surgeon: Marty Heck, MD;  Location: [facility];  Service: Cardiovascular;  Laterality: Bilateral;     Social History   reports that he has never smoked. He has never used smokeless tobacco. He reports current drug use. Frequency: 5.00 times per week. Drug: Marijuana. He reports that he does not drink alcohol.   Family history   His family history includes Diabetes in his father; Stroke in his father. There is no history of Anesthesia problems.   Allergies No Known Allergies   Home meds  Prior to Admission medications   Medication Sig Start Date End Date Taking? Authorizing Provider  acetaminophen (TYLENOL) 500 MG tablet Take 1,000 mg by mouth every 6 (six) hours as needed for mild pain or headache.   Yes [provider]  VELPHORO 500 MG chewable tablet Chew 1,000 mg by mouth 3 (three) times daily with meals. 11/18/18  Yes [provider]  atorvastatin (LIPITOR) 20 MG tablet Take 20 mg by mouth daily. 11/23/18   [provider]  imiquimod (ALDARA) 5 % cream Apply thin layer to affected areas three times per week prior to bed. Patient not taking: Reported on 11/30/2018 11/03/18   Luetta Nutting, DO    Critical care time:  45 min.    Montey Hora, [place] [facility] Pager: [phone].  If no answer, [phone] 11/30/2018, 3:02 PM  Attending Note:  43 year old male with failed renal transplant and severe non-compliance who presents to the ED from dialysis center with respiratory failure.  PCCM consulted.  Upon evaluating the patient he was non-verbal and very agitated in clear respiratory failure with diffuse crackles.  I reviewed CXR myself, pulmonary edema and ?RLL infiltrate.  Fever and elevated WBC.  Will proceed with intubation, full vent support.  ABG and adjust vent accordingly.  Pan culture.  Cefepime/vanc.  Renal consulted for HD now.  Fentanyl and propofol drips ordered.  Check PCT.  ISS and CBGs.  Family updated by RN.   The patient is critically ill with multiple organ systems failure and requires high complexity decision making for assessment and support, frequent evaluation and titration of therapies, application of advanced monitoring technologies and extensive interpretation of multiple databases.   Critical Care Time devoted to patient care services described in this note is  50  Minutes. This time reflects time of care of this signee Dr [name]. This critical care time does not reflect procedure time, or teaching time or supervisory time of PA/NP/Med student/Med Resident etc but could involve care discussion time.  Rush Farmer, M.D. [facility] Pulmonary/Critical Care Medicine. Pager: [phone]. After hours pager: [phone].

## 2018-11-30 NOTE — Progress Notes (Signed)
Central line is subclavian going up to the EJ but that is the only access that is available.  Will use for now.  Rush Farmer, M.D. [facility] Pulmonary/Critical Care Medicine. Pager: [phone]. After hours pager: [phone]

## 2018-11-30 NOTE — Progress Notes (Signed)
eLink Physician-Brief Progress Note Patient Name: [name] DOB: [birth year] MRN: [id number]   Date of Service  11/30/2018  HPI/Events of Note  Pt continues to be dis-synchronous on the vent with Nimbex wearing off. He's also hypotensive on Diprivan  eICU Interventions  Will paralyze patient with Nimbex and start a Versed infusion. Will d/c Propofol since its contributing to his hypotension.        Kerry Kass Ogan 11/30/2018, 9:09 PM

## 2018-11-30 NOTE — Progress Notes (Signed)
Critical ABG results show to Dr Chuck Hint.

## 2018-11-30 NOTE — ED Notes (Signed)
Attempted to transition pt from NRB to [place]. Pt de sated very quickly from 100% down to 83% [place] 6 liters.  Pt placed back on NRB

## 2018-11-30 NOTE — Progress Notes (Signed)
Spoke to Dr. Lucile Shutters with ABG results.  Verbal order to change I:E ration to 1.2:1 and recheck an ABG in 1hr.

## 2018-11-30 NOTE — Code Documentation (Signed)
Pt intubated at this time. 23 at the lip

## 2018-11-30 NOTE — ED Notes (Signed)
Pt diaphoretic vomiting with sats in 70s. Placed on NRB at 15 L with sats in upper 80s-low 90s. Dr. Wilson Singer aware. Respiratory at bedside.

## 2018-11-30 NOTE — Progress Notes (Signed)
Called in patient is hypotensive, dyssynchronous and desaturating.  Changed to APRV, placed TLC and a-line.  Stabilized BP without pressors.  Spoke with Dr. Jonnie Finner, will prioritize for HD with 3.5 liters of volume out.  Pressors ordered as needed.  Sedation ordered.  The patient is critically ill with multiple organ systems failure and requires high complexity decision making for assessment and support, frequent evaluation and titration of therapies, application of advanced monitoring technologies and extensive interpretation of multiple databases.   Critical Care Time devoted to patient care services described in this note is  90  Minutes. This time reflects time of care of this signee Dr [name]. This critical care time does not reflect procedure time, or teaching time or supervisory time of PA/NP/Med student/Med Resident etc but could involve care discussion time.  Rush Farmer, M.D. [facility] Pulmonary/Critical Care Medicine. Pager: [phone]. After hours pager: [phone].

## 2018-11-30 NOTE — Progress Notes (Signed)
Pt removed from bipap by RN due to vomiting.  At this time his o2 saturation is 95% on nasal cannula.

## 2018-11-30 NOTE — ED Notes (Signed)
Gave 100 fentanyl per ok critical care

## 2018-11-30 NOTE — ED Provider Notes (Signed)
[facility] EMERGENCY DEPARTMENT Provider Note   CSN: [id number] Arrival date & time: 11/30/18  8182     History   Chief Complaint No chief complaint on file.   HPI [name] is a 43 y.o. male.     HPI   43yM with fever and dyspnea. Symptoms began yesterday and constant/progressive since then.  Stage renal disease.  Last dialysis was Friday.  Cannot attend dialysis today because of fever and dyspnea.  Referred to the emergency room.  Denies any acute pain.  States that he does not make significant urine.  Past Medical History:  Diagnosis Date   Anemia    ESRD   CHF (congestive heart failure) ([place])    Chronic kidney disease 06/07/2011   Hemodialysis-TTS Last hemo today 05/08/2011   Diabetes mellitus without complication (HCC)    GERD (gastroesophageal reflux disease)    Hypertension 111/29/2012   pt states he takes no HTN meds   Hypothyroidism, secondary    Morbid obesity (HCC)    Paroxysmal atrial fibrillation (HCC)    Sleep apnea 06/07/2011   Pt had sllep study done 2 weeks ago- Dr. Joellyn Quails.  Does not  have a CPAP at this time.    Patient Active Problem List   Diagnosis Date Noted   Hematuria 11/03/2018   Genital warts 11/03/2018   Meralgia paresthetica of right side 08/13/2018   ESRD on dialysis ([place]) 06/04/2018   Acute kidney injury superimposed on CKD ([place]) 05/10/2018   Acidosis 05/10/2018   Anemia 05/10/2018   Acute renal failure superimposed on stage 5 chronic kidney disease, not on chronic dialysis ([place])    Type 2 diabetes mellitus without complication, without long-term current use of insulin ([place]) 01/25/2018   Essential hypertension 01/25/2017   Well adult exam 01/17/2016   Morbid obesity ([place]) 01/17/2016   Delayed graft function of kidney transplant due to reason other than ATN or rejection requiring acute dialysis ([place]) 11/02/2012   Immunosuppression ([place]) 11/02/2012   Kidney replaced by transplant  11/02/2012   OSA (obstructive sleep apnea) 05/26/2011    Past Surgical History:  Procedure Laterality Date   ARTERIOVENOUS GRAFT PLACEMENT  08/09/10   Left Thigh Graft by Dr. [name]   AV FISTULA PLACEMENT Right 05/18/2018   Procedure: INSERTION OF ARTERIOVENOUS (AV) GORE-TEX GRAFT Left THIGH;  Surgeon: Waynetta Sandy, MD;  Location: [facility];  Service: Vascular;  Laterality: Right;   CAPD REMOVAL  05/08/2011   Procedure: CONTINUOUS AMBULATORY PERITONEAL DIALYSIS  (CAPD) CATHETER REMOVAL;  Surgeon: [name], MD;  Location: [facility];  Service: General;  Laterality: N/A;  Removal of CAPD catheter, Dr. requests to go after 100   INSERTION OF DIALYSIS CATHETER  10/05/10   Right Femoral Cath insertion by Dr. [name].  Pt ahas had several caths inserted.   IR FLUORO GUIDE CV LINE RIGHT  05/11/2018   IR US GUIDE VASC ACCESS RIGHT  05/11/2018   KIDNEY TRANSPLANT  2014   UPPER EXTREMITY ANGIOGRAPHY Bilateral 05/13/2018   Procedure: UPPER EXTREMITY ANGIOGRAPHY - bilarteral;  Surgeon: Marty Heck, MD;  Location: [facility];  Service: Cardiovascular;  Laterality: Bilateral;        Home Medications    Prior to Admission medications   Medication Sig Start Date End Date Taking? Authorizing Provider  imiquimod [place]) 5 % cream Apply thin layer to affected areas three times per week prior to bed. 11/03/18   Luetta Nutting, DO    Family  History Family History  Problem Relation Age of Onset   Diabetes Father    Stroke Father    Anesthesia problems Neg Hx     Social History Social History   Tobacco Use   Smoking status: Never Smoker   Smokeless tobacco: Never Used  Substance Use Topics   Alcohol use: No   Drug use: Yes    Frequency: 5.0 times per week    Types: Marijuana     Allergies   Patient has no known allergies.   Review of Systems Review of Systems  All systems reviewed and negative, other than as noted in  HPI.  Physical Exam Updated Vital Signs BP (!) 206/116    Pulse (!) 106    Temp 99.2 F (37.3 C) (Axillary)    Resp (!) 25    Ht 6\' 2"  (1.88 m)    Wt (!) 147.4 kg    SpO2 100%    BMI 41.73 kg/m   Physical Exam Vitals signs and nursing note reviewed.  Constitutional:      Appearance: He is well-developed. He is obese. He is ill-appearing.  HENT:     Head: Normocephalic and atraumatic.  Eyes:     General:        Right eye: No discharge.        Left eye: No discharge.     Conjunctiva/sclera: Conjunctivae normal.  Neck:     Musculoskeletal: Neck supple.  Cardiovascular:     Rate and Rhythm: Regular rhythm. Tachycardia present.     Heart sounds: Normal heart sounds. No murmur. No friction rub. No gallop.   Pulmonary:     Comments: Respiratory distress. coarse breath sounds bilaterally. Abdominal:     General: There is no distension.     Palpations: Abdomen is soft.     Tenderness: There is no abdominal tenderness.  Musculoskeletal:        General: No tenderness.  Skin:    General: Skin is warm and dry.  Neurological:     Mental Status: He is alert.  Psychiatric:        Behavior: Behavior normal.        Thought Content: Thought content normal.      ED Treatments / Results  Labs (all labs ordered are listed, but only abnormal results are displayed) Labs Reviewed  BASIC METABOLIC PANEL - Abnormal; Notable for the following components:      Result Value   Chloride 96 (*)    Glucose, Bld 106 (*)    BUN 69 (*)    Creatinine, Ser 15.23 (*)    Calcium 8.7 (*)    GFR calc non Af Amer 3 (*)    GFR calc Af Amer 4 (*)    Anion gap 17 (*)    All other components within normal limits  CBC WITH DIFFERENTIAL/PLATELET - Abnormal; Notable for the following components:   WBC 11.7 (*)    RBC 2.96 (*)    Hemoglobin 8.1 (*)    HCT 25.7 (*)    RDW 16.6 (*)    Neutro Abs 9.7 (*)    Abs Immature Granulocytes 0.10 (*)    All other components within normal limits  PHOSPHORUS -  Abnormal; Notable for the following components:   Phosphorus 6.4 (*)    All other components within normal limits  SARS CORONAVIRUS 2 (HOSPITAL ORDER, [facility])  CULTURE, BLOOD (ROUTINE X 2)  CULTURE, BLOOD (ROUTINE X 2)  MAGNESIUM  LACTIC  ACID, PLASMA  LACTIC ACID, PLASMA    EKG EKG Interpretation  Date/Time:  Tuesday November 30 2018 08:23:19 EDT Ventricular Rate:  122 PR Interval:    QRS Duration: 87 QT Interval:  321 QTC Calculation: 458 R Axis:   -18 Text Interpretation:  Sinus tachycardia Borderline left axis deviation Confirmed by Virgel Manifold [phone]) on 11/30/2018 9:33:09 AM   Radiology Dg Chest Portable 1 View  Result Date: 11/30/2018 CLINICAL DATA:  Fever and hypoxia EXAM: PORTABLE CHEST 1 VIEW COMPARISON:  May 10, 2018 FINDINGS: There is airspace consolidation in the right base region. There is more subtle airspace opacity in the left upper lobe. Heart is enlarged with mild pulmonary venous hypertension. No adenopathy. There is aortic atherosclerosis. No bone lesions. IMPRESSION: Areas of airspace opacity felt to represent pneumonia in the right base and left upper lobe regions. Consolidation is greatest in the right base region. There is cardiomegaly with a degree of pulmonary vascular congestion. No adenopathy evident. Aortic Atherosclerosis (ICD10-I70.0). Electronically Signed   By: Lowella Grip III M.D.   On: 11/30/2018 09:09    Procedures Procedures (including critical care time)  CRITICAL CARE Performed by: Virgel Manifold Total critical care time: 35 minutes Critical care time was exclusive of separately billable procedures and treating other patients. Critical care was necessary to treat or prevent imminent or life-threatening deterioration. Critical care was time spent personally by me on the following activities: development of treatment plan with patient and/or surrogate as well as nursing, discussions with consultants,  evaluation of patient's response to treatment, examination of patient, obtaining history from patient or surrogate, ordering and performing treatments and interventions, ordering and review of laboratory studies, ordering and review of radiographic studies, pulse oximetry and re-evaluation of patient's condition.  Medications Ordered in ED Medications  vancomycin (VANCOCIN) 2,500 mg in sodium chloride 0.9 % 500 mL IVPB (2,500 mg Intravenous New Bag/Given 11/30/18 1006)  vancomycin (VANCOCIN) IVPB 1000 mg/200 mL premix (has no administration in time range)  ceFEPIme (MAXIPIME) 2 g in sodium chloride 0.9 % 100 mL IVPB (has no administration in time range)  acetaminophen (TYLENOL) tablet 1,000 mg (1,000 mg Oral Given 11/30/18 0907)  albuterol (VENTOLIN HFA) 108 (90 Base) MCG/ACT inhaler 2 puff (2 puffs Inhalation Given 11/30/18 0854)  ceFEPIme (MAXIPIME) 2 g in sodium chloride 0.9 % 100 mL IVPB (2 g Intravenous New Bag/Given 11/30/18 4270)     Initial Impression / Assessment and Plan / ED Course  I have reviewed the triage vital signs and the nursing notes.  Pertinent labs & imaging results that were available during my care of the patient were reviewed by me and considered in my medical decision making (see chart for details).        2:40 PM Pt increasingly hypoxic. Had to take off of bipap because of vomiting. Can't get O2 sats higher than 80s. He is becoming somewhat agitated and keeps taking off NRB and is too dyspneic to speak.  He was given intubate.  Critical care than at bedside.  They are to intubate and assume care.  Final Clinical Impressions(s) / ED Diagnoses   Final diagnoses:  \  Oxygen desaturation  Respiratory failure ([place])  Acute respiratory failure with hypoxia (HCC)  Elevated LFTs  Acute respiratory failure [facility])    ED Discharge Orders    None       Virgel Manifold, MD 12/05/18 1145

## 2018-11-30 NOTE — Progress Notes (Signed)
Called Dr. Lucile Shutters with ABG results.  Verbal order given to change I:E to 1.5:1.

## 2018-11-30 NOTE — Progress Notes (Signed)
eLink Physician-Brief Progress Note Patient Name: [name] DOB: Jan 12, 1976 MRN: [id number]   Date of Service  11/30/2018  HPI/Events of Note  Pt with extreme ventilator dis-synchrony despite max dose Precedex  eICU Interventions  Discontinue Precedex, Versed 2 mg iv x 1, Propofol infusion, Nimbex 0.1 mg/kg iv x 1        [name] U Maelin Kurkowski 11/30/2018, 7:57 PM

## 2018-12-01 ENCOUNTER — Inpatient Hospital Stay (HOSPITAL_COMMUNITY): Payer: 59

## 2018-12-01 DIAGNOSIS — I248 Other forms of acute ischemic heart disease: Secondary | ICD-10-CM

## 2018-12-01 DIAGNOSIS — R0602 Shortness of breath: Secondary | ICD-10-CM

## 2018-12-01 DIAGNOSIS — N185 Chronic kidney disease, stage 5: Secondary | ICD-10-CM

## 2018-12-01 LAB — POCT I-STAT 7, (LYTES, BLD GAS, ICA,H+H)
Acid-base deficit: 9 mmol/L — ABNORMAL HIGH (ref 0.0–2.0)
Acid-base deficit: 8 mmol/L — ABNORMAL HIGH (ref 0.0–2.0)
Acid-base deficit: 9 mmol/L — ABNORMAL HIGH (ref 0.0–2.0)
Acid-base deficit: 8 mmol/L — ABNORMAL HIGH (ref 0.0–2.0)
Bicarbonate: 18.5 mmol/L — ABNORMAL LOW (ref 20.0–28.0)
Bicarbonate: 20.6 mmol/L (ref 20.0–28.0)
Bicarbonate: 18.4 mmol/L — ABNORMAL LOW (ref 20.0–28.0)
Bicarbonate: 20.1 mmol/L (ref 20.0–28.0)
Calcium, Ion: 1.02 mmol/L — ABNORMAL LOW (ref 1.15–1.40)
Calcium, Ion: 1.04 mmol/L — ABNORMAL LOW (ref 1.15–1.40)
Calcium, Ion: 0.95 mmol/L — ABNORMAL LOW (ref 1.15–1.40)
Calcium, Ion: 0.89 mmol/L — CL (ref 1.15–1.40)
HCT: 28 % — ABNORMAL LOW (ref 39.0–52.0)
HCT: 30 % — ABNORMAL LOW (ref 39.0–52.0)
HCT: 31 % — ABNORMAL LOW (ref 39.0–52.0)
HCT: 30 % — ABNORMAL LOW (ref 39.0–52.0)
Hemoglobin: 9.5 g/dL — ABNORMAL LOW (ref 13.0–17.0)
Hemoglobin: 10.2 g/dL — ABNORMAL LOW (ref 13.0–17.0)
Hemoglobin: 10.5 g/dL — ABNORMAL LOW (ref 13.0–17.0)
Hemoglobin: 10.2 g/dL — ABNORMAL LOW (ref 13.0–17.0)
O2 Saturation: 91 %
O2 Saturation: 91 %
O2 Saturation: 99 %
O2 Saturation: 99 %
Patient temperature: 99.6
Patient temperature: 100.3
Patient temperature: 99.6
Patient temperature: 100.3
Potassium: 5.6 mmol/L — ABNORMAL HIGH (ref 3.5–5.1)
Potassium: 6.7 mmol/L (ref 3.5–5.1)
Potassium: 6 mmol/L — ABNORMAL HIGH (ref 3.5–5.1)
Potassium: 5.5 mmol/L — ABNORMAL HIGH (ref 3.5–5.1)
Sodium: 137 mmol/L (ref 135–145)
Sodium: 134 mmol/L — ABNORMAL LOW (ref 135–145)
Sodium: 133 mmol/L — ABNORMAL LOW (ref 135–145)
Sodium: 135 mmol/L (ref 135–145)
TCO2: 20 mmol/L — ABNORMAL LOW (ref 22–32)
TCO2: 22 mmol/L (ref 22–32)
TCO2: 20 mmol/L — ABNORMAL LOW (ref 22–32)
TCO2: 22 mmol/L (ref 22–32)
pCO2 arterial: 50 mmHg — ABNORMAL HIGH (ref 32.0–48.0)
pCO2 arterial: 60.7 mmHg — ABNORMAL HIGH (ref 32.0–48.0)
pCO2 arterial: 44.1 mmHg (ref 32.0–48.0)
pCO2 arterial: 52.3 mmHg — ABNORMAL HIGH (ref 32.0–48.0)
pH, Arterial: 7.179 — CL (ref 7.350–7.450)
pH, Arterial: 7.144 — CL (ref 7.350–7.450)
pH, Arterial: 7.231 — ABNORMAL LOW (ref 7.350–7.450)
pH, Arterial: 7.197 — CL (ref 7.350–7.450)
pO2, Arterial: 77 mmHg — ABNORMAL LOW (ref 83.0–108.0)
pO2, Arterial: 83 mmHg (ref 83.0–108.0)
pO2, Arterial: 180 mmHg — ABNORMAL HIGH (ref 83.0–108.0)
pO2, Arterial: 161 mmHg — ABNORMAL HIGH (ref 83.0–108.0)

## 2018-12-01 LAB — CBC
HCT: 31.9 % — ABNORMAL LOW (ref 39.0–52.0)
HCT: 29.8 % — ABNORMAL LOW (ref 39.0–52.0)
Hemoglobin: 9.2 g/dL — ABNORMAL LOW (ref 13.0–17.0)
Hemoglobin: 8.6 g/dL — ABNORMAL LOW (ref 13.0–17.0)
MCH: 27 pg (ref 26.0–34.0)
MCH: 27 pg (ref 26.0–34.0)
MCHC: 28.8 g/dL — ABNORMAL LOW (ref 30.0–36.0)
MCHC: 28.9 g/dL — ABNORMAL LOW (ref 30.0–36.0)
MCV: 93.5 fL (ref 80.0–100.0)
MCV: 93.7 fL (ref 80.0–100.0)
Platelets: 369 10*3/uL (ref 150–400)
Platelets: 343 10*3/uL (ref 150–400)
RBC: 3.41 MIL/uL — ABNORMAL LOW (ref 4.22–5.81)
RBC: 3.18 MIL/uL — ABNORMAL LOW (ref 4.22–5.81)
RDW: 16.7 % — ABNORMAL HIGH (ref 11.5–15.5)
RDW: 16.6 % — ABNORMAL HIGH (ref 11.5–15.5)
WBC: 11 10*3/uL — ABNORMAL HIGH (ref 4.0–10.5)
WBC: 12.2 10*3/uL — ABNORMAL HIGH (ref 4.0–10.5)
nRBC: 1.5 % — ABNORMAL HIGH (ref 0.0–0.2)
nRBC: 2.1 % — ABNORMAL HIGH (ref 0.0–0.2)

## 2018-12-01 LAB — GLUCOSE, CAPILLARY
Glucose-Capillary: 108 mg/dL — ABNORMAL HIGH (ref 70–99)
Glucose-Capillary: 111 mg/dL — ABNORMAL HIGH (ref 70–99)
Glucose-Capillary: 115 mg/dL — ABNORMAL HIGH (ref 70–99)
Glucose-Capillary: 138 mg/dL — ABNORMAL HIGH (ref 70–99)
Glucose-Capillary: 147 mg/dL — ABNORMAL HIGH (ref 70–99)
Glucose-Capillary: 148 mg/dL — ABNORMAL HIGH (ref 70–99)
Glucose-Capillary: 149 mg/dL — ABNORMAL HIGH (ref 70–99)
Glucose-Capillary: 155 mg/dL — ABNORMAL HIGH (ref 70–99)
Glucose-Capillary: 19 mg/dL — CL (ref 70–99)
Glucose-Capillary: 31 mg/dL — CL (ref 70–99)
Glucose-Capillary: 89 mg/dL (ref 70–99)

## 2018-12-01 LAB — RENAL FUNCTION PANEL
Albumin: 2.9 g/dL — ABNORMAL LOW (ref 3.5–5.0)
Albumin: 2.8 g/dL — ABNORMAL LOW (ref 3.5–5.0)
Anion gap: 20 — ABNORMAL HIGH (ref 5–15)
Anion gap: 25 — ABNORMAL HIGH (ref 5–15)
BUN: 60 mg/dL — ABNORMAL HIGH (ref 6–20)
BUN: 62 mg/dL — ABNORMAL HIGH (ref 6–20)
CO2: 20 mmol/L — ABNORMAL LOW (ref 22–32)
CO2: 17 mmol/L — ABNORMAL LOW (ref 22–32)
Calcium: 8.2 mg/dL — ABNORMAL LOW (ref 8.9–10.3)
Calcium: 7.4 mg/dL — ABNORMAL LOW (ref 8.9–10.3)
Chloride: 98 mmol/L (ref 98–111)
Chloride: 96 mmol/L — ABNORMAL LOW (ref 98–111)
Creatinine, Ser: 13.43 mg/dL — ABNORMAL HIGH (ref 0.61–1.24)
Creatinine, Ser: 13.96 mg/dL — ABNORMAL HIGH (ref 0.61–1.24)
GFR calc Af Amer: 5 mL/min — ABNORMAL LOW (ref >60)
GFR calc Af Amer: 4 mL/min — ABNORMAL LOW (ref >60)
GFR calc non Af Amer: 4 mL/min — ABNORMAL LOW (ref >60)
GFR calc non Af Amer: 4 mL/min — ABNORMAL LOW (ref >60)
Glucose, Bld: 89 mg/dL (ref 70–99)
Glucose, Bld: 164 mg/dL — ABNORMAL HIGH (ref 70–99)
Phosphorus: 9.3 mg/dL — ABNORMAL HIGH (ref 2.5–4.6)
Phosphorus: 9.1 mg/dL — ABNORMAL HIGH (ref 2.5–4.6)
Potassium: 6.7 mmol/L (ref 3.5–5.1)
Potassium: 5.9 mmol/L — ABNORMAL HIGH (ref 3.5–5.1)
Sodium: 138 mmol/L (ref 135–145)
Sodium: 138 mmol/L (ref 135–145)

## 2018-12-01 LAB — HEPATIC FUNCTION PANEL
ALT: 2757 U/L — ABNORMAL HIGH (ref 0–44)
AST: 7277 U/L — ABNORMAL HIGH (ref 15–41)
Albumin: 2.7 g/dL — ABNORMAL LOW (ref 3.5–5.0)
Alkaline Phosphatase: 57 U/L (ref 38–126)
Bilirubin, Direct: 0.8 mg/dL — ABNORMAL HIGH (ref 0.0–0.2)
Indirect Bilirubin: 0.9 mg/dL (ref 0.3–0.9)
Total Bilirubin: 1.7 mg/dL — ABNORMAL HIGH (ref 0.3–1.2)
Total Protein: 6.7 g/dL (ref 6.5–8.1)

## 2018-12-01 LAB — ECHOCARDIOGRAM COMPLETE
Height: 74 in
Weight: 5220.49 oz

## 2018-12-01 LAB — TROPONIN I (HIGH SENSITIVITY)
Troponin I (High Sensitivity): 2449 ng/L (ref <18)
Troponin I (High Sensitivity): 3298 ng/L (ref <18)

## 2018-12-01 LAB — POTASSIUM
Potassium: 5.8 mmol/L — ABNORMAL HIGH (ref 3.5–5.1)
Potassium: 6.4 mmol/L (ref 3.5–5.1)

## 2018-12-01 LAB — MAGNESIUM: Magnesium: 2.4 mg/dL (ref 1.7–2.4)

## 2018-12-01 LAB — HEPARIN LEVEL (UNFRACTIONATED): Heparin Unfractionated: <0.1 IU/mL — ABNORMAL LOW (ref 0.30–0.70)

## 2018-12-01 LAB — TRIGLYCERIDES: Triglycerides: 146 mg/dL (ref <150)

## 2018-12-01 MED ORDER — ALBUTEROL SULFATE (2.5 MG/3ML) 0.083% IN NEBU
10.0000 mg | INHALATION_SOLUTION | Freq: Once | RESPIRATORY_TRACT | Status: AC
  Administered 2018-12-01: 09:00:00 10 mg via RESPIRATORY_TRACT
  Filled 2018-12-01: qty 12

## 2018-12-01 MED ORDER — DEXTROSE 50 % IV SOLN
1.0000 | Freq: Once | INTRAVENOUS | Status: AC
  Administered 2018-12-01: 50 mL via INTRAVENOUS
  Filled 2018-12-01: qty 50

## 2018-12-01 MED ORDER — SODIUM BICARBONATE 8.4 % IV SOLN
INTRAVENOUS | Status: DC
  Administered 2018-12-01 – 2018-12-02 (×3): via INTRAVENOUS
  Filled 2018-12-01 (×5): qty 150

## 2018-12-01 MED ORDER — PRISMASOL BGK 4/2.5 32-4-2.5 MEQ/L IV SOLN: INTRAVENOUS | Status: DC

## 2018-12-01 MED ORDER — PRISMASOL BGK 4/2.5 32-4-2.5 MEQ/L REPLACEMENT SOLN: Status: DC

## 2018-12-01 MED ORDER — SODIUM BICARBONATE 8.4 % IV SOLN: 50.0000 meq | Freq: Once | INTRAVENOUS | Status: AC

## 2018-12-01 MED ORDER — EPINEPHRINE 1 MG/10ML IJ SOSY
0.0500 mg | PREFILLED_SYRINGE | Freq: Once | INTRAMUSCULAR | Status: AC
  Administered 2018-12-01: 0.05 mg via INTRAVENOUS

## 2018-12-01 MED ORDER — DEXTROSE 50 % IV SOLN
1.0000 | Freq: Once | INTRAVENOUS | Status: AC
  Administered 2018-12-01: 50 mL via INTRAVENOUS

## 2018-12-01 MED ORDER — PRISMASOL BGK 0/2.5 32-2.5 MEQ/L IV SOLN
INTRAVENOUS | Status: DC
  Administered 2018-12-01 – 2018-12-02 (×10): via INTRAVENOUS_CENTRAL
  Filled 2018-12-01 (×17): qty 5000

## 2018-12-01 MED ORDER — DEXTROSE-NACL 5-0.45 % IV SOLN
INTRAVENOUS | Status: DC
  Administered 2018-12-01: 05:00:00 via INTRAVENOUS

## 2018-12-01 MED ORDER — SODIUM BICARBONATE 8.4 % IV SOLN
INTRAVENOUS | Status: AC
  Administered 2018-12-01: 06:00:00 100 meq via INTRAVENOUS
  Filled 2018-12-01: qty 50

## 2018-12-01 MED ORDER — EPINEPHRINE 1 MG/10ML IJ SOSY
PREFILLED_SYRINGE | INTRAMUSCULAR | Status: AC
  Administered 2018-12-01: 0.05 mg via INTRAVENOUS
  Filled 2018-12-01: qty 10

## 2018-12-01 MED ORDER — SODIUM BICARBONATE 8.4 % IV SOLN
100.0000 meq | Freq: Once | INTRAVENOUS | Status: AC
  Administered 2018-12-01: 100 meq via INTRAVENOUS

## 2018-12-01 MED ORDER — HEPARIN (PORCINE) 25000 UT/250ML-% IV SOLN
1700.0000 [IU]/h | INTRAVENOUS | Status: DC
  Administered 2018-12-01: 1500 [IU]/h via INTRAVENOUS
  Administered 2018-12-02: 02:00:00 1700 [IU]/h via INTRAVENOUS
  Filled 2018-12-01 (×2): qty 250

## 2018-12-01 MED ORDER — SODIUM POLYSTYRENE SULFONATE 15 GM/60ML PO SUSP
30.0000 g | Freq: Once | ORAL | Status: AC
  Administered 2018-12-01: 09:00:00 30 g via ORAL
  Filled 2018-12-01: qty 120

## 2018-12-01 MED ORDER — SODIUM CHLORIDE 0.9 % IV SOLN
2.0000 g | Freq: Two times a day (BID) | INTRAVENOUS | Status: DC
  Administered 2018-12-02 – 2018-12-03 (×3): 2 g via INTRAVENOUS
  Filled 2018-12-01 (×3): qty 2

## 2018-12-01 MED ORDER — PRISMASOL BGK 0/2.5 32-2.5 MEQ/L IV SOLN
INTRAVENOUS | Status: DC
  Administered 2018-12-01: 16:00:00 via INTRAVENOUS_CENTRAL
  Filled 2018-12-01 (×2): qty 5000

## 2018-12-01 MED ORDER — VANCOMYCIN HCL 10 G IV SOLR
1500.0000 mg | INTRAVENOUS | Status: DC
  Administered 2018-12-02: 17:00:00 1500 mg via INTRAVENOUS
  Filled 2018-12-01 (×2): qty 1500

## 2018-12-01 MED ORDER — EPINEPHRINE 1 MG/10ML IJ SOSY
PREFILLED_SYRINGE | INTRAMUSCULAR | Status: AC
  Administered 2018-12-01: 0.1 mg via INTRAVENOUS
  Filled 2018-12-01: qty 10

## 2018-12-01 MED ORDER — DEXTROSE 50 % IV SOLN
INTRAVENOUS | Status: AC
  Filled 2018-12-01: qty 50

## 2018-12-01 MED ORDER — ALTEPLASE 2 MG IJ SOLR
2.0000 mg | Freq: Once | INTRAMUSCULAR | Status: DC | PRN
  Filled 2018-12-01: qty 2

## 2018-12-01 MED ORDER — FAMOTIDINE IN NACL 20-0.9 MG/50ML-% IV SOLN
20.0000 mg | INTRAVENOUS | Status: DC
  Administered 2018-12-01 – 2018-12-04 (×4): 20 mg via INTRAVENOUS
  Filled 2018-12-01 (×4): qty 50

## 2018-12-01 MED ORDER — DEXTROSE 50 % IV SOLN
1.0000 | Freq: Once | INTRAVENOUS | Status: AC
  Administered 2018-12-01: 06:00:00 50 mL via INTRAVENOUS

## 2018-12-01 MED ORDER — SODIUM CHLORIDE 0.9 % FOR CRRT
INTRAVENOUS_CENTRAL | Status: DC | PRN
  Filled 2018-12-01: qty 1000

## 2018-12-01 MED ORDER — SODIUM BICARBONATE 8.4 % IV SOLN
INTRAVENOUS | Status: AC
  Filled 2018-12-01: qty 50

## 2018-12-01 MED ORDER — INSULIN ASPART 100 UNIT/ML IV SOLN
5.0000 [IU] | Freq: Once | INTRAVENOUS | Status: AC
  Administered 2018-12-01: 09:00:00 5 [IU] via INTRAVENOUS

## 2018-12-01 MED ORDER — EPINEPHRINE 1 MG/10ML IJ SOSY
0.1000 mg | PREFILLED_SYRINGE | Freq: Once | INTRAMUSCULAR | Status: AC
  Administered 2018-12-01: 0.1 mg via INTRAVENOUS

## 2018-12-01 MED ORDER — CHLORHEXIDINE GLUCONATE CLOTH 2 % EX PADS
6.0000 | MEDICATED_PAD | Freq: Every day | CUTANEOUS | Status: DC
  Administered 2018-12-02 – 2018-12-03 (×3): 6 via TOPICAL

## 2018-12-01 MED ORDER — SODIUM BICARBONATE 8.4 % IV SOLN
INTRAVENOUS | Status: AC
  Administered 2018-12-01: 50 meq
  Filled 2018-12-01: qty 100

## 2018-12-01 MED ORDER — EPINEPHRINE PF 1 MG/ML IJ SOLN
0.5000 ug/min | INTRAVENOUS | Status: DC
  Administered 2018-12-01: 06:00:00 10 ug/min via INTRAVENOUS
  Administered 2018-12-01: 16:00:00 6 ug/min via INTRAVENOUS
  Filled 2018-12-01 (×3): qty 4

## 2018-12-01 MED ORDER — HEPARIN SODIUM (PORCINE) 1000 UNIT/ML DIALYSIS
1000.0000 [IU] | INTRAMUSCULAR | Status: DC | PRN
  Administered 2018-12-03: 2800 [IU] via INTRAVENOUS_CENTRAL
  Filled 2018-12-01 (×2): qty 6

## 2018-12-01 MED ORDER — SODIUM BICARBONATE 8.4 % IV SOLN
50.0000 meq | Freq: Once | INTRAVENOUS | Status: AC
  Administered 2018-12-01: 50 meq via INTRAVENOUS
  Filled 2018-12-01: qty 50

## 2018-12-01 MED ORDER — SODIUM BICARBONATE 8.4 % IV SOLN: 100.0000 meq | Freq: Once | INTRAVENOUS | Status: AC

## 2018-12-01 MED ORDER — DEXTROSE 50 % IV SOLN
INTRAVENOUS | Status: AC
  Administered 2018-12-01: 07:00:00 50 mL via INTRAVENOUS
  Filled 2018-12-01: qty 50

## 2018-12-01 MED ORDER — HEPARIN BOLUS VIA INFUSION
4000.0000 [IU] | Freq: Once | INTRAVENOUS | Status: AC
  Administered 2018-12-01: 4000 [IU] via INTRAVENOUS
  Filled 2018-12-01: qty 4000

## 2018-12-01 MED ORDER — PRISMASOL BGK 0/2.5 32-2.5 MEQ/L IV SOLN
INTRAVENOUS | Status: DC
  Administered 2018-12-01 – 2018-12-02 (×2): via INTRAVENOUS_CENTRAL
  Filled 2018-12-01 (×4): qty 5000

## 2018-12-01 MED ORDER — HYDROCORTISONE NA SUCCINATE PF 100 MG IJ SOLR
100.0000 mg | Freq: Three times a day (TID) | INTRAMUSCULAR | Status: DC
  Administered 2018-12-01 – 2018-12-03 (×7): 100 mg via INTRAVENOUS
  Filled 2018-12-01 (×7): qty 2

## 2018-12-01 NOTE — Progress Notes (Signed)
ANTICOAGULATION CONSULT NOTE - Initial Consult  Pharmacy Consult for heparin Indication: chest pain/ACS  No Known Allergies  Patient Measurements: Height: 6' 2" (188 cm) Weight: (!) 326 lb 4.5 oz (148 kg) IBW/kg (Calculated) : 82.2 Heparin Dosing Weight: 116.2 kg  Vital Signs: Temp: 100.5 F (38.1 C) (06/24 1100) Temp Source: Axillary (06/24 1100) Pulse Rate: 108 (06/24 1100)  Labs: Recent Labs    11/30/18 0855  11/30/18 1710  12/01/18 0413 12/01/18 0532 12/01/18 0636  HGB 8.1*   < > 8.3*   < > 9.2* 10.2* 8.6*  HCT 25.7*   < > 27.3*   < > 31.9* 30.0* 29.8*  PLT 327  --  356  --  369  --  343  APTT  --   --  31  --   --   --   --   LABPROT  --   --  18.3*  --   --   --   --   INR  --   --  1.5*  --   --   --   --   CREATININE 15.23*  --  17.47*  --  13.43*  --   --    < > = values in this interval not displayed.    Estimated Creatinine Clearance: 10.8 mL/min (A) (by C-G formula based on SCr of 13.43 mg/dL (H)).   Medical History: Past Medical History:  Diagnosis Date  . Anemia    ESRD  . CHF (congestive heart failure) ([place])   . Diabetes mellitus without complication ([place])   . ESRD on hemodialysis ([place]) 06/07/2011   TTS [employer]. Started HD in 2006, got transplant in 2014 lasted until Dec 2019 then went back on HD.  Has L thigh AVG as of Jun 2020.  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   Marland Kitchen GERD (gastroesophageal reflux disease)   . Hypertension 111/29/2012   pt states he takes no HTN meds  . Hypothyroidism, secondary   . Morbid obesity ([place])   . Paroxysmal atrial fibrillation ([facility])   . Sleep apnea 06/07/2011   Pt had sllep study done 2 weeks ago- Dr. Joellyn Quails.  Does not  have a CPAP at this time.    Medications:  Scheduled:  . artificial tears  1 application Both Eyes W2H  . chlorhexidine gluconate (MEDLINE KIT)  15 mL Mouth Rinse BID  . Chlorhexidine Gluconate Cloth  6 each Topical Q0600  . Chlorhexidine Gluconate Cloth  6 each Topical  Daily  . guaiFENesin  600 mg Oral BID  . heparin  4,000 Units Intravenous Once  . hydrocortisone sod succinate (SOLU-CORTEF) inj  100 mg Intravenous Q8H  . mouth rinse  15 mL Mouth Rinse 10 times per day  . nitroGLYCERIN  0.5 inch Topical STAT  . promethazine  12.5 mg Intravenous Once  . sucroferric oxyhydroxide  1,000 mg Oral TID WC   Infusions:  . ceFEPime (MAXIPIME) IV Stopped (11/30/18 2357)  . cisatracurium (NIMBEX) infusion 1 mcg/kg/min (12/01/18 1051)  . dextrose 5 % and 0.45% NaCl Stopped (12/01/18 8527)  . epinephrine 2 mcg/min (12/01/18 1000)  . famotidine (PEPCID) IV 20 mg (12/01/18 1036)  . fentaNYL infusion INTRAVENOUS 150 mcg/hr (12/01/18 1000)  . heparin    . midazolam 5 mg/hr (12/01/18 1000)  . norepinephrine (LEVOPHED) Adult infusion 60 mcg/min (12/01/18 1041)  .  sodium bicarbonate  infusion 1000 mL 125 mL/hr at 12/01/18 1110  . vancomycin    . vasopressin (  PITRESSIN) infusion - *FOR SHOCK* 0.04 Units/min (12/01/18 1000)    Assessment: 43 yo M admitted for respiratory distress after missing HD. High-sensitivity troponin rise from [phone]. Pharmacy consulted to start IV heparin for ACS. Patient was receiving heparin 5,000 units [place] with the last dose given around 0600 this AM. Not on anticoagulation PTA.  Hgb 8.6, pltc wnl Baseline aPTT 31, INR 1.5  Goal of Therapy:  Heparin level 0.3-0.7 units/ml Monitor platelets by anticoagulation protocol: Yes   Plan:  Heparin IV bolus 4,000 units x 1 Start heparin infusion at 1500 units/hr Check 8 hour heparin level Daily heparin level and CBC Monitor for s/sx of bleeding  Vertis Kelch, PharmD PGY1 Pharmacy Resident Phone [phone] 12/01/2018       12:19 PM

## 2018-12-01 NOTE — Progress Notes (Signed)
CRITICAL VALUE ALERT  Critical Value:  K 6.4  Date & Time Notied:  1898 12/01/2018  Provider Notified: Eliseo Gum, NP  Orders Received/Actions taken: orders for CRRT already placed. No new orders at this time.

## 2018-12-01 NOTE — Progress Notes (Signed)
Critical abg results called to Dr. Lucile Shutters.  Order given to increase RR to 22.

## 2018-12-01 NOTE — Progress Notes (Signed)
ANTICOAGULATION CONSULT NOTE  Pharmacy Consult for heparin Indication: chest pain/ACS  No Known Allergies  Patient Measurements: Height: 6\' 2"  (188 cm) Weight: (!) 326 lb 4.5 oz (148 kg) IBW/kg (Calculated) : 82.2 Heparin Dosing Weight: 116.2 kg  Vital Signs: Temp: 99 F (37.2 C) (06/24 2000) Temp Source: Oral (06/24 2000) BP: 134/79 (06/24 2321) Pulse Rate: 87 (06/24 2321)  Labs: Recent Labs    11/30/18 1710  12/01/18 0413  12/01/18 0636 12/01/18 1302 12/01/18 1656 12/01/18 1752 12/01/18 2116  HGB 8.3*   < > 9.2*   < > 8.6* 10.5* 10.2*  --   --   HCT 27.3*   < > 31.9*   < > 29.8* 31.0* 30.0*  --   --   PLT 356  --  369  --  343  --   --   --   --   APTT 31  --   --   --   --   --   --   --   --   LABPROT 18.3*  --   --   --   --   --   --   --   --   INR 1.5*  --   --   --   --   --   --   --   --   HEPARINUNFRC  --   --   --   --   --   --   --   --  <0.10*  CREATININE 17.47*  --  13.43*  --   --   --   --  13.96*  --    < > = values in this interval not displayed.    Estimated Creatinine Clearance: 10.4 mL/min (A) (by C-G formula based on SCr of 13.96 mg/dL (H)).  Assessment: 43 yo M admitted for respiratory distress after missing HD. High-sensitivity troponin rise from [phone]. Pharmacy consulted to start IV heparin for ACS. Patient was receiving heparin 5,000 units [place] with the last dose given around 0600 this AM. Not on anticoagulation PTA.  Heparin level <0.1 units/ml  Goal of Therapy:  Heparin level 0.3-0.7 units/ml Monitor platelets by anticoagulation protocol: Yes   Plan:  Increase heparin infusion at 1500 units/hr Check 8 hour heparin level Daily heparin level and CBC Monitor for s/sx of bleeding  Thanks for allowing pharmacy to be a part of this patient's care.  Excell Seltzer, PharmD Clinical Pharmacist

## 2018-12-01 NOTE — Progress Notes (Signed)
[facility] Progress Note  Subjective: intubated and on pressors now, CXR's worsening severe bilat infiltrates.   Vitals:   12/01/18 0900 12/01/18 1000 12/01/18 1100 12/01/18 1110  BP:      Pulse: (!) 115 (!) 110 (!) 108   Resp: 16 (!) 22 20   Temp:   (!) 100.5 F (38.1 C)   TempSrc:   Axillary   SpO2: (!) 88% (!) 88% (!) 85% (!) 87%  Weight:      Height:        Inpatient medications: . artificial tears  1 application Both Eyes E3T  . chlorhexidine gluconate (MEDLINE KIT)  15 mL Mouth Rinse BID  . Chlorhexidine Gluconate Cloth  6 each Topical Q0600  . Chlorhexidine Gluconate Cloth  6 each Topical Daily  . guaiFENesin  600 mg Oral BID  . hydrocortisone sod succinate (SOLU-CORTEF) inj  100 mg Intravenous Q8H  . mouth rinse  15 mL Mouth Rinse 10 times per day  . nitroGLYCERIN  0.5 inch Topical STAT  . promethazine  12.5 mg Intravenous Once  . sucroferric oxyhydroxide  1,000 mg Oral TID WC   . ceFEPime (MAXIPIME) IV Stopped (11/30/18 2357)  . cisatracurium (NIMBEX) infusion 1 mcg/kg/min (12/01/18 1230)  . dextrose 5 % and 0.45% NaCl Stopped (12/01/18 5320)  . epinephrine 2 mcg/min (12/01/18 1000)  . famotidine (PEPCID) IV 20 mg (12/01/18 1036)  . fentaNYL infusion INTRAVENOUS 150 mcg/hr (12/01/18 1000)  . heparin 1,500 Units/hr (12/01/18 1238)  . midazolam 5 mg/hr (12/01/18 1227)  . norepinephrine (LEVOPHED) Adult infusion 60 mcg/min (12/01/18 1041)  .  sodium bicarbonate  infusion 1000 mL 125 mL/hr at 12/01/18 1110  . vancomycin    . vasopressin (PITRESSIN) infusion - *FOR SHOCK* 0.04 Units/min (12/01/18 1000)   acetaminophen **OR** acetaminophen, albuterol, fentaNYL, midazolam, ondansetron **OR** ondansetron (ZOFRAN) IV   Exam: Intubated on vent  No jvd  Chest clear ant/ lat  Cor tachy no RG  ABd obese dec'd BS  ext 1+ LE edema  Neuro on vent, sedated   Outpt HD: TTS [facility]  4.5h  500/1.5  146kg   2/2.25 bath  Hep 11400   L thigh AVG -  parsabiv  - mircera 150 q 2wks - hectorol 4 ug    Assessment/ Plan: 1. SOB/ hypoxemia - now on vent, severe bilat infiltrates worsened since yesterday. +fevers, ARDS vs vol related or ischemia related 2. Fever - prob PNA, started on IV abx 3. H/o failed renal Tx 4. ESRD on HD TTS. Too sick for iHD, will start CRRT. Get volume down as tolerated.  5. Morbid obesity 6. HTN severe - now in shock on pressors 7. DM2   [facility] 12/01/2018, 1:27 PM  Iron/TIBC/Ferritin/ %Sat    Component Value Date/Time   IRON 46 05/26/2018   TIBC 202 05/26/2018   FERRITIN 1,155 05/26/2018   IRONPCTSAT 59 (H) 05/11/2018 0235   Recent Labs  Lab 11/30/18 1710  12/01/18 0413  12/01/18 1302  NA 139   < > 138   < > 133*  K 4.6   < > 6.7*   < > 6.0*  CL 100  --  98  --   --   CO2 21*  --  20*  --   --   GLUCOSE 111*  --  89  --   --   BUN 74*  --  60*  --   --   CREATININE 17.47*  --  13.43*  --   --   CALCIUM 8.3*  --  8.2*  --   --   PHOS 8.2*  --  9.3*  --   --   ALBUMIN 2.8*  --  2.9*  --   --   INR 1.5*  --   --   --   --    < > = values in this interval not displayed.   Recent Labs  Lab 11/30/18 1710  AST 12*  ALT 12  ALKPHOS 42  BILITOT 1.1  PROT 6.9   Recent Labs  Lab 12/01/18 0636 12/01/18 1302  WBC 12.2*  --   HGB 8.6* 10.5*  HCT 29.8* 31.0*  PLT 343  --

## 2018-12-01 NOTE — Progress Notes (Signed)
eLink Physician-Brief Progress Note Patient Name: [name] DOB: Nov 21, 1975 MRN: [id number]   Date of Service  12/01/2018  HPI/Events of Note  Respiratory acidosis, metabolic acidosis, improved oxygenation  eICU Interventions  Increase respiratory rate to 20, Sodium bicarbonate 1 AMP iv bolus, wean Fio2 as tolerated for sats 92 % or better        [place] U Ogan 12/01/2018, 3:38 AM

## 2018-12-01 NOTE — Consult Note (Addendum)
Cardiology Consultation:   Patient ID: [name] MRN: [id number]; DOB: December 25, 1975  Admit date: 11/30/2018 Date of Consult: 12/01/2018  Primary Care Provider: Luetta Nutting, DO Primary Cardiologist: New Primary Electrophysiologist:  None    Patient Profile:   [name] is a 43 y.o. male with a hx of chronic diastolic HF, ESRD s/p renal transplant in 2014 at [place] that lasted until 2019 and placed back on HD, T2DM, HTN, morbid obesity and OSA who is being seen today for the evaluation of elevated troponin at the request of Dr. Nelda Marseille, Pulmonary Critical Care Medicine.   History of Present Illness:   [name] is a 43 y.o. male with a hx of chronic diastolic HF, ESRD s/p renal transplant in 2014 at [place] that lasted until 2019 and placed back on HD, T2DM, HTN, morbid obesity and OSA who is being seen today for the evaluation of elevated troponin at the request of Dr. Nelda Marseille, Pulmonary Critical Care Medicine. No other past cardiac conditions, other than diastolic HF. He had a 2D echo in 2019 that showed normal EF and G2DD. No significant valvular disease. Per chart review, he had a stress echo at [facility] in 2009 and again in 2011, both were normal. His family is present at bedside. They deny any family h/o MI or SCD.   Pt presented to the [facility] ED on 6/23 w/ CC of severe SOB and productive cough. This was in the setting of a missed HD cession the day prior. No chest pain at the time. In the ED. He was tachycardic, tachypneic, febrile w/ temp of 100.4 F and severely hypertensive w/ BP of 214/122 and hypoxic with O2 sats as low as 72%. He was placed on BiPAP. WBC 11.7, hemoglobin 8.1, potassium 4.8, BUN 69, creatinine 15.23, lactic acid 1.2 COVID-19 screening negative. No BNP lab ordered. CXR showed cardiomegaly with pulmonary vascular congestion. Also with findings concerning for PNA. Troponin is elevated 66>>>2,449>>>3,298. He decompensated in the ED and required intubation. Remains on  vent. Requiring pressors, on vasopressin and Levophed. 2D echo pending.    Heart Pathway Score:     Past Medical History:  Diagnosis Date  . Anemia    ESRD  . CHF (congestive heart failure) ([place])   . Diabetes mellitus without complication ([place])   . ESRD on hemodialysis ([place]) 06/07/2011   TTS [employer]. Started HD in 2006, got transplant in 2014 lasted until Dec 2019 then went back on HD.  Has L thigh AVG as of Jun 2020.  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   Marland Kitchen GERD (gastroesophageal reflux disease)   . Hypertension 111/29/2012   pt states he takes no HTN meds  . Hypothyroidism, secondary   . Morbid obesity ([place])   . Paroxysmal atrial fibrillation (H[facility])   . Sleep apnea 06/07/2011   Pt had sllep study done 2 weeks ago- Dr. Joellyn Quails.  Does not  have a CPAP at this time.    Past Surgical History:  Procedure Laterality Date  . ARTERIOVENOUS GRAFT PLACEMENT  08/09/10   Left Thigh Graft by Dr. [name]  . AV FISTULA PLACEMENT Right 05/18/2018   Procedure: INSERTION OF ARTERIOVENOUS (AV) GORE-TEX GRAFT Left THIGH;  Surgeon: Waynetta Sandy, MD;  Location: [place];  Service: Vascular;  Laterality: Right;  . CAPD REMOVAL  05/08/2011   Procedure: CONTINUOUS AMBULATORY PERITONEAL DIALYSIS  (CAPD) CATHETER REMOVAL;  Surgeon: [name], MD;  Location: [place];  Service: General;  Laterality:  N/A;  Removal of CAPD catheter, Dr. requests to go after 100  . INSERTION OF DIALYSIS CATHETER  10/05/10   Right Femoral Cath insertion by Dr. [name].  Pt ahas had several caths inserted.  . IR FLUORO GUIDE CV LINE RIGHT  05/11/2018  . IR US GUIDE VASC ACCESS RIGHT  05/11/2018  . KIDNEY TRANSPLANT  2014  . UPPER EXTREMITY ANGIOGRAPHY Bilateral 05/13/2018   Procedure: UPPER EXTREMITY ANGIOGRAPHY - bilarteral;  Surgeon: Marty Heck, MD;  Location: [facility];  Service: Cardiovascular;  Laterality: Bilateral;     Home Medications:  Prior to Admission  medications   Medication Sig Start Date End Date Taking? Authorizing Provider  acetaminophen (TYLENOL) 500 MG tablet Take 1,000 mg by mouth every 6 (six) hours as needed for mild pain or headache.   Yes [provider]  VELPHORO 500 MG chewable tablet Chew 1,000 mg by mouth 3 (three) times daily with meals. 11/18/18  Yes [provider]  atorvastatin (LIPITOR) 20 MG tablet Take 20 mg by mouth daily. 11/23/18   [provider]  imiquimod (ALDARA) 5 % cream Apply thin layer to affected areas three times per week prior to bed. Patient not taking: Reported on 11/30/2018 11/03/18   Luetta Nutting, DO    Inpatient Medications: Scheduled Meds: . artificial tears  1 application Both Eyes N5A  . chlorhexidine gluconate (MEDLINE KIT)  15 mL Mouth Rinse BID  . Chlorhexidine Gluconate Cloth  6 each Topical Q0600  . Chlorhexidine Gluconate Cloth  6 each Topical Daily  . guaiFENesin  600 mg Oral BID  . heparin  5,000 Units Subcutaneous Q8H  . hydrocortisone sod succinate (SOLU-CORTEF) inj  100 mg Intravenous Q8H  . mouth rinse  15 mL Mouth Rinse 10 times per day  . nitroGLYCERIN  0.5 inch Topical STAT  . promethazine  12.5 mg Intravenous Once  . sucroferric oxyhydroxide  1,000 mg Oral TID WC   Continuous Infusions: . ceFEPime (MAXIPIME) IV Stopped (11/30/18 2357)  . cisatracurium (NIMBEX) infusion 1 mcg/kg/min (12/01/18 1051)  . dextrose 5 % and 0.45% NaCl Stopped (12/01/18 2130)  . epinephrine 2 mcg/min (12/01/18 1000)  . famotidine (PEPCID) IV 20 mg (12/01/18 1036)  . fentaNYL infusion INTRAVENOUS 150 mcg/hr (12/01/18 1000)  . midazolam 5 mg/hr (12/01/18 1000)  . norepinephrine (LEVOPHED) Adult infusion 60 mcg/min (12/01/18 1041)  .  sodium bicarbonate  infusion 1000 mL 125 mL/hr at 12/01/18 1110  . vancomycin    . vasopressin (PITRESSIN) infusion - *FOR SHOCK* 0.04 Units/min (12/01/18 1000)   PRN Meds: acetaminophen **OR** acetaminophen, albuterol, fentaNYL,  midazolam, ondansetron **OR** ondansetron (ZOFRAN) IV  Allergies:   No Known Allergies  Social History:   Social History   Socioeconomic History  . Marital status: Single    Spouse name: Not on file  . Number of children: 0  . Years of education: Not on file  . Highest education level: Not on file  Occupational History    Employer: [employer]  . Financial resource strain: Not on file  . Food insecurity    Worry: Not on file    Inability: Not on file  . Transportation needs    Medical: Not on file    Non-medical: Not on file  Tobacco Use  . Smoking status: Never Smoker  . Smokeless tobacco: Never Used  Substance and Sexual Activity  . Alcohol use: No  . Drug use: Yes    Frequency: 5.0 times per week  Types: Marijuana  . Sexual activity: Not on file  Lifestyle  . Physical activity    Days per week: Not on file    Minutes per session: Not on file  . Stress: Not on file  Relationships  . Social Herbalist on phone: Not on file    Gets together: Not on file    Attends religious service: Not on file    Active member of club or organization: Not on file    Attends meetings of clubs or organizations: Not on file    Relationship status: Not on file  . Intimate partner violence    Fear of current or ex partner: Not on file    Emotionally abused: Not on file    Physically abused: Not on file    Forced sexual activity: Not on file  Other Topics Concern  . Not on file  Social History Narrative   Lives alone.     Family History:    Family History  Problem Relation Age of Onset  . Diabetes Father   . Stroke Father   . Anesthesia problems Neg Hx      ROS:  Please see the history of present illness.   All other ROS reviewed and negative.     Physical Exam/Data:   Vitals:   12/01/18 0900 12/01/18 1000 12/01/18 1100 12/01/18 1110  BP:      Pulse: (!) 115 (!) 110 (!) 108   Resp: 16 (!) 22 20   Temp:   (!) 100.5 F (38.1 C)   TempSrc:    Axillary   SpO2: (!) 88% (!) 88% (!) 85% (!) 87%  Weight:      Height:        Intake/Output Summary (Last 24 hours) at 12/01/2018 1137 Last data filed at 12/01/2018 1000 Gross per 24 hour  Intake 2765.86 ml  Output 1618 ml  Net 1147.86 ml   Last 3 Weights 12/01/2018 11/30/2018 11/03/2018  Weight (lbs) 326 lb 4.5 oz 325 lb 325 lb 9.9 oz  Weight (kg) 148 kg 147.419 kg 147.7 kg     Body mass index is 41.89 kg/m.  General:  Critically ill, obese AAM, intubated and sedated HEENT: normal Lymph: no adenopathy Neck: no JVD Endocrine:  No thryomegaly Vascular: No carotid bruits; FA pulses 2+ bilaterally without bruits  Cardiac:  normal S1, S2; RRR; no murmur  Lungs: intubated, clear to auscultation bilaterally, no wheezing, rhonchi or rales  Abd: soft, nontender, no hepatomegaly  Ext: no edema Musculoskeletal:  No deformities, BUE and BLE strength normal and equal Skin: warm and dry  Neuro:  Intubated and sedated Psych:  Intubated and sedated  EKG:  The EKG was personally reviewed and demonstrates:  Sinus tachycardia 115 bpm, nonspecific twave abnormality, TWI in AvL Telemetry:  Telemetry was personally reviewed and demonstrates:  Sinus tach 110 bpm, 5 beat run of NSVT   Relevant CV Studies: 2D Echo (old - 2019) Study Conclusions  - Left ventricle: The cavity size was normal. Wall thickness was   increased in a pattern of mild LVH. Systolic function was normal.   The estimated ejection fraction was in the range of 55% to 60%.   Wall motion was normal; there were no regional wall motion   abnormalities. Features are consistent with a pseudonormal left   ventricular filling pattern, with concomitant abnormal relaxation   and increased filling pressure (grade 2 diastolic dysfunction). - Mitral valve: There was no significant regurgitation. - Left  atrium: The atrium was moderately dilated. - Right ventricle: Systolic function was normal. - Tricuspid valve: There was no significant  regurgitation. - Pulmonic valve: There was trivial regurgitation.  Impressions:  - Exam truncated due to patient emesis, no subcostal views   obtained. Normal LV EF without wall motion abnormalities. Grade 2   diastolic dysfunction. No significant valvular disease.   Laboratory Data:  High Sensitivity Troponin:   Recent Labs  Lab 11/30/18 1710 12/01/18 0636 12/01/18 0843  TROPONINIHS 66* 2,449* 3,298*     Cardiac EnzymesNo results for input(s): TROPONINI in the last 168 hours. No results for input(s): TROPIPOC in the last 168 hours.  Chemistry Recent Labs  Lab 11/30/18 0855  11/30/18 1710  12/01/18 0309 12/01/18 0413 12/01/18 0532 12/01/18 0843  NA 136   < > 139   < > 137 138 134*  --   K 4.8   < > 4.6   < > 5.6* 6.7* 6.7* 5.8*  CL 96*  --  100  --   --  98  --   --   CO2 23  --  21*  --   --  20*  --   --   GLUCOSE 106*  --  111*  --   --  89  --   --   BUN 69*  --  74*  --   --  60*  --   --   CREATININE 15.23*  --  17.47*  --   --  13.43*  --   --   CALCIUM 8.7*  --  8.3*  --   --  8.2*  --   --   GFRNONAA 3*  --  3*  --   --  4*  --   --   GFRAA 4*  --  3*  --   --  5*  --   --   ANIONGAP 17*  --  18*  --   --  20*  --   --    < > = values in this interval not displayed.    Recent Labs  Lab 11/30/18 1710 12/01/18 0413  PROT 6.9  --   ALBUMIN 2.8* 2.9*  AST 12*  --   ALT 12  --   ALKPHOS 42  --   BILITOT 1.1  --    Hematology Recent Labs  Lab 11/30/18 1710  12/01/18 0413 12/01/18 0532 12/01/18 0636  WBC 10.4  --  11.0*  --  12.2*  RBC 3.09*  --  3.41*  --  3.18*  HGB 8.3*   < > 9.2* 10.2* 8.6*  HCT 27.3*   < > 31.9* 30.0* 29.8*  MCV 88.3  --  93.5  --  93.7  MCH 26.9  --  27.0  --  27.0  MCHC 30.4  --  28.8*  --  28.9*  RDW 16.7*  --  16.7*  --  16.6*  PLT 356  --  369  --  343   < > = values in this interval not displayed.   BNPNo results for input(s): BNP, PROBNP in the last 168 hours.  DDimer No results for input(s): DDIMER in the last  168 hours.   Radiology/Studies:  Dg Chest Port 1 View  Result Date: 12/01/2018 CLINICAL DATA:  Oxygen desaturation EXAM: PORTABLE CHEST 1 VIEW COMPARISON:  Yesterday FINDINGS: Diffuse airspace opacity which appears somewhat less dense than before, likely related to better lung volumes. Endotracheal tube tip is at  the clavicular heads. The orogastric tube reaches the stomach. Left subclavian catheter rises in the IJ, tip not seen. This finding is known to the service based on prior documented communication. IMPRESSION: Stable hardware positioning including left subclavian catheter terminating in the left IJ. Mildly improved lung volumes but continued extensive airspace disease. Electronically Signed   By: Monte Fantasia M.D.   On: 12/01/2018 07:10   Dg Chest Port 1 View  Result Date: 11/30/2018 CLINICAL DATA:  43 year old male status post central line placement. EXAM: PORTABLE CHEST 1 VIEW COMPARISON:  Earlier radiograph dated 11/30/2018 FINDINGS: A left subclavian central line extends superiorly in the left internal jugular vein. Recommend removal and repositioning. Endotracheal tube remains above the carina and enteric tube extends inferiorly beyond the inferior margin of the image. Bilateral confluent airspace opacities with overall interval worsening since the prior radiograph. No pneumothorax. Small pleural effusions may be present. Stable cardiomegaly. No acute osseous pathology. IMPRESSION: 1. Left subclavian central line extends superiorly in the left internal jugular vein. Recommend removal and repositioning. 2. Interval worsening of the bilateral airspace opacities. 3. No pneumothorax. These results were called by telephone at the time of interpretation on 11/30/2018 at 7:07 pm to Dr. [name] , who verbally acknowledged these results. Electronically Signed   By: Anner Crete M.D.   On: 11/30/2018 19:20   Dg Chest Portable 1 View  Result Date: 11/30/2018 CLINICAL DATA:  Intubation.   ESRD EXAM: PORTABLE CHEST 1 VIEW COMPARISON:  11/30/2018 FINDINGS: Endotracheal tube in good position.  NG tube enters the stomach. Diffuse bilateral airspace disease with progression from earlier today. No significant pleural effusion. IMPRESSION: Endotracheal tube in good position Significant progression of diffuse bilateral airspace disease which may represent edema or pneumonia. Electronically Signed   By: Franchot Gallo M.D.   On: 11/30/2018 15:23   Dg Chest Portable 1 View  Result Date: 11/30/2018 CLINICAL DATA:  Fever and hypoxia EXAM: PORTABLE CHEST 1 VIEW COMPARISON:  May 10, 2018 FINDINGS: There is airspace consolidation in the right base region. There is more subtle airspace opacity in the left upper lobe. Heart is enlarged with mild pulmonary venous hypertension. No adenopathy. There is aortic atherosclerosis. No bone lesions. IMPRESSION: Areas of airspace opacity felt to represent pneumonia in the right base and left upper lobe regions. Consolidation is greatest in the right base region. There is cardiomegaly with a degree of pulmonary vascular congestion. No adenopathy evident. Aortic Atherosclerosis (ICD10-I70.0). Electronically Signed   By: Lowella Grip III M.D.   On: 11/30/2018 09:09    Assessment and Plan:   LAMAJ METOYER is a 43 y.o. male with a hx of chronic diastolic HF, ESRD s/p renal transplant in 2014 at [place] that lasted until 2019 and placed back on HD, T2DM, HTN, morbid obesity and OSA who was admitted for acute hypoxic respiratory failure, requiring intubation, in the setting of acute CHF and possible PNA, after a missed HD session.  He is in multiorgan failure and requiring pressor support. He is being seen today for the evaluation of elevated troponin at the request of Dr. Nelda Marseille, Pulmonary Critical Care Medicine.   1. Elevated Troponin: high sensitivity troponin abnormal at 66>>2,449, 3,298. He does have ESRD. Based on the level of enzyme elevation, suspect this  is demand ischemia in the setting of critical illness/ acute hypoxic respiratory failure. 2D echo pending. Will assess LVEF and wall motion to determine need for any additional w/u but do not suspect ACS.   2.  Acute CHF: in the setting of missed HD session. 2D echo pending. Will continue volume control through HD.   3. Acute Hypoxic Respiratory Failure: intubated. Vent management per PCCM. COVID negative. Multifactoral, including acute CHF, ? PNA. ?ARDS (may need RHC).   4. Possible PNA: antibiotics per primary team.  5. ESRD: HD. Management per nephrology.   6. Hyperkalemia: HD per HD.    For questions or updates, please contact [place] Please consult [email or web address] for contact info under   Signed, Lyda Jester, PA-C  12/01/2018 11:37 AM  History and all data above reviewed.  Patient examined.  I agree with the findings as above.   The patient is intubated and sedated and presents with respiratory failure progressing to ventilation.  He has shock requiring pressor support.   hsTrop is elevated.   The patient exam reveals COR:RRR  ,  Lungs: Decreased breath sounds  ,  Abd: Absent bowel sounds, Ext No edema  .  All available labs, radiology testing, previous records reviewed. Agree with documented assessment and plan. Patient with respiratory failure with infiltrate on CXR suggestive of pneumonia.  Being treated for pneumonia and possible sepsis.  No acute EKG changes.  Prelim echo results with well preserved LVEF.  There is dilatation and hypokinesis of the RV but I am not suspecting an acute RV infarct.  The hsTrop in this situation is consistent with demand ischemia and would be consistent with a secondary phenomenon rather than the primary contributor.  I think it is reasonable to continue to trend the enzymes to see how they go up and to continue to treat with heparin.  However, I don't think that this is any indication for left and right heart cath with angiography at this point.   It might be necessary to do right heart if we have trouble with diuresis via dialysis and continued hypotension.  He might have some component of hypoventilation obesity or pulmonary hypertension that make diuresis more difficult in this situation.  However, for now I would continue the current support including CRRT per nephrology.   Jeneen Rinks Maylin [place]  1:45 PM  12/01/2018

## 2018-12-01 NOTE — Progress Notes (Signed)
eLink Physician-Brief Progress Note Patient Name: [name] DOB: [birth year] MRN: [id number]   Date of Service  12/01/2018  HPI/Events of Note  Pt in profound shock with SBP of 66 mmHg despite Levophed and Vasopressin  eICU Interventions  Sodium bicarbonate x total of 3 amps then bicarb infusion, Epinephrine iv bolus of 100 mcg x 1 followed by repeat dose of 50 mcg, Epinephrine infusion started, Father [name] called and informed of son's grave prognosis, I confirmed full code status, I let them know that they are welcome to come to the hospital to be with him, Solucortef 100 mg iv Q 8 hours, stat CBC        Merek Niu U Thurl Boen 12/01/2018, 6:25 AM

## 2018-12-01 NOTE — Progress Notes (Signed)
NAME:  [name], MRN:  [id number], DOB:  1976-03-11, LOS: 1 ADMISSION DATE:  11/30/2018, CONSULTATION DATE:  11/30/18 REFERRING MD:  Wilson Singer  CHIEF COMPLAINT:  SOB   Brief History   [name] is a 43 y.o. male who presented to [facility] ED 6/23 with respiratory distress and hypoxic respiratory failure presumed due to acute pulmonary edema after missing HD. Also possible HCAP. Required intubation in ED due to extremis.  History of present illness   Pt is encephelopathic; therefore, this HPI is obtained from chart review. [name] is a 43 y.o. male who has a PMH including but not limited to ESRD on HD TTS after kidney transplant failure (transplanted May 2014 and failed Dec 2019 - was being followed at [facility]), HTN, DM, CHF, morbid obesity, OSA on CPAP (see "past medical history" for rest).  He presented to [facility] ED 6/23 with SOB x 1 day.  He had missed HD on 6/18 so had a session 6/19 then went [place] the beach.  Went for his scheduled session 6/23 and was found to have mild fever (100.4) along with worsening SOB; therefore, was sent to ED for further evaluation.   In ED, CXR prior to intubation demonstrated vascular congestion and possible basilar infiltrate.  He was in extremis and failed BiPAP due to vomiting.  He required emergent intubation due to desaturations as low as 50's.  No recent fevers/chills/sweats, chest pain, myalgias, exposures to known sick contacts.  COVID negative.   Past Medical History  OSA on CPAP, PAF, HTN, combined CHF (echo from 2019 with EF 55-60%, G2DD), ESRD on HD s/p renal transplant 2014, DM, hypothyroidism.  Significant Hospital Events   6/23 > admit.  Consults:  Nephrology. Cardiology Vascular Surgery   Procedures:  ETT 6/23 >  L [place] CVC 6/23>    Significant Diagnostic Tests:  CXR 6/23 > vascular congestion, basilar consolidation R > L. CXR 6/24> some improvement in lung volumes, diffuse bilateral ASD  ECHO 6/24>>>   Micro Data:  Blood 6/23  >  Sputum 6/23 > no organisms seen  COVID 6/23 > negative.  Antimicrobials:  Vanc 6/23 >  Cefepime 6/23 >   Interim history/subjective:  Overnight with worsening hypoxia as well as worsening shock, started on nimbex gtt, propofol dc for fent and midaz gtt. On NE, Vaso, Epi for shock  Oxygenation with some improvement to 88% following nimbex gtt  Remains hyperkalemic following dialysis, have ordered kayexalate, insulin/dextrose, bicarb, albuterol   Objective:  Blood pressure 134/60, pulse (!) 114, temperature 99.1 F (37.3 C), temperature source Axillary, resp. rate (!) 22, height 6\' 2"  (1.88 m), weight (!) 148 kg, SpO2 (!) 89 %.    Vent Mode: PCV FiO2 (%):  [100 %] 100 % Set Rate:  [16 bmp-22 bmp] 22 bmp Vt Set:  [650 mL] 650 mL PEEP:  [15 cmH20-20 cmH20] 15 cmH20 Plateau Pressure:  [32 cmH20-38 cmH20] 32 cmH20   Intake/Output Summary (Last 24 hours) at 12/01/2018 0752 Last data filed at 12/01/2018 0600 Gross per 24 hour  Intake 1876.55 ml  Output 1618 ml  Net 258.55 ml   Filed Weights   11/30/18 0826 12/01/18 0444  Weight: (!) 147.4 kg (!) 148 kg    Examination: General: Obese adult male, critically ill appearing, intubated and chemically paralyzed/sedated, supine in bed  Neuro: Sedated and chemically paralyzed.  HEENT: NCAT ETT OGT secure. Pink mmm, trachea midline.  Cardiovascular: tachycardia, s1s2 no rgm. Capillary refill < 3 seconds  Lungs: Diminished lung sound with some scattered crackles bilaterally. Synchronous with vent. Symmetrical chest expansion  Abdomen: Obese, soft, hypoactive bowel sounds x4  Musculoskeletal: BUE BLE soft compartments. R groin dressing c/d/i.  Skin: clean, dry, warm, intact without rash   Assessment & Plan:   Acute hypoxic respiratory failure requiring intubation -pulmonary edema in setting of missed iHD -some improvement in lung volume on CXR 6/24 however persistent diffuse ASD  -started nimbex gtt overnight  P - Continue full  vent support, SpO2 goal > 92.  - Continue nimbex gtt at this time as we are seeing some improvement in oxygenation - AM CXR -Continue pulm hygiene -ABG 6/24 at 12:00   Shock - Septic vs cardiogenic - Tracheal aspirate 6/24 reveals no organisms  P - Continue empiric vanc/cefepime  - MAP goal > 65  - BCx pending  - Continue NE, Vaso, Epi for MAP goal  - angio II not suitable option as cannot rule out ACS  - ACS workup as below  - Check LFTs   Possible ACS -troponin > 2000 on 6/24 P -stat ECHO -Cardiology consult  - Would like to start heparin gtt however patient also needing vascular access as below. Vascular Surgery to see patient this morning, will plan to start heparin gtt after   ESRD after failed renal transplant, on OP dialysis  - Persistent hyperkalemia following dialysis, now with shock requiring pressors P - Anticipate he will need CRRT given severity of shock - Have consulted vascular surgery for access - Nephrology following for RRT management  - For hyperkalemia, has received bicarb, insulin/dextrose ordered, kayexalate ordered, albuterol ordered  - trend K+   Hx Hypertension. - holding antihypertensives in setting of shock   Anemia of chronic disease. - Transfuse for Hgb < 7.  Hx DM. -acute hypoglycemia P -Continue Dextrose gtt  -SSI has been discontinued   Goals of Care: -Father advised by overnight team (6/24 approx 0600) that the patient's condition is worsening and advised that family come to hospital. Family is planning to arrive 6/24 approximately 1100. They have been advised of hospital visitation regulations pertaining to COVID-19.  -He endorses full code status at this time   Best Practice:  Diet: NPO  Pain/Anxiety/Delirium protocol (if indicated): Nimbex gtt, Fent gtt, Midaz gtt  VAP protocol (if indicated): In place. DVT prophylaxis: SCD's / SQ Heparin. GI prophylaxis: Pepcid  Glucose control: Acute hypoglycemia on dextrose gtt  Mobility:  Bedrest. Code Status: Full. Family Communication: Father updated via phone 6/24  Disposition: ICU.  Labs   CBC: Recent Labs  Lab 11/30/18 0855  11/30/18 1710  11/30/18 2248 12/01/18 0309 12/01/18 0413 12/01/18 0532 12/01/18 0636  WBC 11.7*  --  10.4  --   --   --  11.0*  --  12.2*  NEUTROABS 9.7*  --   --   --   --   --   --   --   --   HGB 8.1*   < > 8.3*   < > 10.5* 9.5* 9.2* 10.2* 8.6*  HCT 25.7*   < > 27.3*   < > 31.0* 28.0* 31.9* 30.0* 29.8*  MCV 86.8  --  88.3  --   --   --  93.5  --  93.7  PLT 327  --  356  --   --   --  369  --  343   < > = values in this interval not displayed.   Basic Metabolic Panel: Recent Labs  Lab 11/30/18 0855  11/30/18 1710  11/30/18 2028 11/30/18 2248 12/01/18 0309 12/01/18 0413 12/01/18 0532  NA 136   < > 139   < > 136 136 137 138 134*  K 4.8   < > 4.6   < > 4.2 4.0 5.6* 6.7* 6.7*  CL 96*  --  100  --   --   --   --  98  --   CO2 23  --  21*  --   --   --   --  20*  --   GLUCOSE 106*  --  111*  --   --   --   --  89  --   BUN 69*  --  74*  --   --   --   --  60*  --   CREATININE 15.23*  --  17.47*  --   --   --   --  13.43*  --   CALCIUM 8.7*  --  8.3*  --   --   --   --  8.2*  --   MG 2.4  --  2.4  --   --   --   --  2.4  --   PHOS 6.4*  --  8.2*  --   --   --   --  9.3*  --    < > = values in this interval not displayed.   GFR: Estimated Creatinine Clearance: 10.8 mL/min (A) (by C-G formula based on SCr of 13.43 mg/dL (H)). Recent Labs  Lab 11/30/18 0855 11/30/18 1710 12/01/18 0413 12/01/18 0636  PROCALCITON  --  16.32  --   --   WBC 11.7* 10.4 11.0* 12.2*  LATICACIDVEN 1.2 2.6*  --   --    Liver Function Tests: Recent Labs  Lab 11/30/18 1710 12/01/18 0413  AST 12*  --   ALT 12  --   ALKPHOS 42  --   BILITOT 1.1  --   PROT 6.9  --   ALBUMIN 2.8* 2.9*   No results for input(s): LIPASE, AMYLASE in the last 168 hours. No results for input(s): AMMONIA in the last 168 hours. ABG    Component Value Date/Time    PHART 7.144 (LL) 12/01/2018 0532   PCO2ART 60.7 (H) 12/01/2018 0532   PO2ART 83.0 12/01/2018 0532   HCO3 20.6 12/01/2018 0532   TCO2 22 12/01/2018 0532   ACIDBASEDEF 8.0 (H) 12/01/2018 0532   O2SAT 91.0 12/01/2018 0532    Coagulation Profile: Recent Labs  Lab 11/30/18 1710  INR 1.5*   Cardiac Enzymes: No results for input(s): CKTOTAL, CKMB, CKMBINDEX, TROPONINI in the last 168 hours. HbA1C: Hgb A1c MFr Bld  Date/Time Value Ref Range Status  05/11/2018 02:35 AM 5.8 (H) 4.8 - 5.6 % Final    Comment:    (NOTE) Pre diabetes:          5.7%-6.4% Diabetes:              >6.4% Glycemic control for   <7.0% adults with diabetes   01/25/2018 08:51 AM 6.3 4.6 - 6.5 % Final    Comment:    Glycemic Control Guidelines for People with Diabetes:Non Diabetic:  <6%Goal of Therapy: <7%Additional Action Suggested:  >8%    CBG: Recent Labs  Lab 12/01/18 0349 12/01/18 0410 12/01/18 0625 12/01/18 0642 12/01/18 0733  GLUCAP 19* 89 31* 115* 111*     Critical care time: 70 minutes     Eliseo Gum  MSN, AGACNP-BC [place] [phone] If no answer, [phone] 12/01/2018, 7:53 AM

## 2018-12-01 NOTE — Progress Notes (Signed)
Critical ABG results shown to Dr Vaughan Browner.  Changes made per verbal order

## 2018-12-01 NOTE — Progress Notes (Signed)
CRITICAL VALUE ALERT  Critical Value:  K 6.7  Date & Time Notified:  12/01/18 0453  Provider Notified: E-Link RN  Orders Received/Actions taken: Will inform E-Link Md. No new orders at this time

## 2018-12-01 NOTE — Progress Notes (Signed)
eLink Physician-Brief Progress Note Patient Name: ALEXES LAMARQUE DOB: 1976/01/15 MRN: [id number]   Date of Service  12/01/2018  HPI/Events of Note  Hypoglycemia, needs restraint order renewed  eICU Interventions  Pt received 1 amp of D50 W, will start D5 0.45 saline infusion at 60 ml/hr, will renew restraint order        Frederik Pear 12/01/2018, 4:16 AM

## 2018-12-01 NOTE — Progress Notes (Signed)
CRITICAL VALUE ALERT  Critical Value:  Trop 3298  Date & Time Notied: 0945  Provider Notified: Eliseo Gum, NP  Orders Received/Actions taken: cardiology consulted. CCM aware. No new orders at this time.

## 2018-12-01 NOTE — Op Note (Signed)
    Patient name: [name] MRN: [id number] DOB: 05-12-1976 Sex: male   Pre-operative Diagnosis: ESRD Post-operative diagnosis:  Same Surgeon:  Annamarie Major Procedure:   Ultrasound-guided placement of right femoral temporary dialysis catheter Blood Loss: None  Indications: The patient is critically ill in the ICU.  He has limited options for access and is being considered for continuous dialysis and so a catheter needed to be placed.  Procedure: The patient was correctly identified in his ICU room.  He was intubated sedated and paralyzed.  He was prepped and draped in the usual fashion.  Ultrasound was used to evaluate the right common femoral vein which was widely patent and easily compressible.  The right common femoral vein was then cannulated under ultrasound guidance with an 18-gauge needle.  A 035 wire was then advanced without resistance.  Sequential dilators were used to dilate the subcutaneous tract and a trialysis catheter was inserted without difficulty.  All 3 ports flushed and aspirated without difficulty.  The catheter was sutured into position with 3-0 nylon.  Sterile dressings were applied.  There were no immediate complications.     Theotis Burrow, M.D., [facility] [place] Office: [phone] Pager:  [phone]

## 2018-12-01 NOTE — Progress Notes (Signed)
PHARMACY NOTE:  ANTIMICROBIAL RENAL DOSAGE ADJUSTMENT  Current antimicrobial regimen includes a mismatch between antimicrobial dosage and estimated renal function.  As per policy approved by the Pharmacy & Therapeutics and Medical Executive Committees, the antimicrobial dosage will be adjusted accordingly.  Current antimicrobial dosage:   Vancomycin IV 1 G qHD TTS Cefepime IV 2 G qHD TTS   Indication: Sepsis  Renal Function:  Estimated Creatinine Clearance: 10.8 mL/min (A) (by C-G formula based on SCr of 13.43 mg/dL (H)). []      On intermittent HD, scheduled: [x]      On CRRT - to begin 06/24 PM    Antimicrobial dosage has been changed to:  Vancomycin IV 1500 mg Q24H -first dose to be given 24 hours after starting CRRT Cefepime IV 2 G Q12H  -first dose to be given 12 hours after starting CRRT   Additional comments:    Thank you for allowing pharmacy to be a part of this patient's care.  Lenon Oms, [place] 12/01/2018 2:43 PM

## 2018-12-02 ENCOUNTER — Inpatient Hospital Stay (HOSPITAL_COMMUNITY): Payer: 59

## 2018-12-02 LAB — RENAL FUNCTION PANEL
Albumin: 2.8 g/dL — ABNORMAL LOW (ref 3.5–5.0)
Albumin: 2.7 g/dL — ABNORMAL LOW (ref 3.5–5.0)
Anion gap: 21 — ABNORMAL HIGH (ref 5–15)
Anion gap: 18 — ABNORMAL HIGH (ref 5–15)
BUN: 50 mg/dL — ABNORMAL HIGH (ref 6–20)
BUN: 48 mg/dL — ABNORMAL HIGH (ref 6–20)
CO2: 20 mmol/L — ABNORMAL LOW (ref 22–32)
CO2: 23 mmol/L (ref 22–32)
Calcium: 7.5 mg/dL — ABNORMAL LOW (ref 8.9–10.3)
Calcium: 8 mg/dL — ABNORMAL LOW (ref 8.9–10.3)
Chloride: 97 mmol/L — ABNORMAL LOW (ref 98–111)
Chloride: 97 mmol/L — ABNORMAL LOW (ref 98–111)
Creatinine, Ser: 9.5 mg/dL — ABNORMAL HIGH (ref 0.61–1.24)
Creatinine, Ser: 7.93 mg/dL — ABNORMAL HIGH (ref 0.61–1.24)
GFR calc Af Amer: 7 mL/min — ABNORMAL LOW (ref >60)
GFR calc Af Amer: 9 mL/min — ABNORMAL LOW (ref >60)
GFR calc non Af Amer: 6 mL/min — ABNORMAL LOW (ref >60)
GFR calc non Af Amer: 7 mL/min — ABNORMAL LOW (ref >60)
Glucose, Bld: 168 mg/dL — ABNORMAL HIGH (ref 70–99)
Glucose, Bld: 140 mg/dL — ABNORMAL HIGH (ref 70–99)
Phosphorus: 6.4 mg/dL — ABNORMAL HIGH (ref 2.5–4.6)
Phosphorus: 5.1 mg/dL — ABNORMAL HIGH (ref 2.5–4.6)
Potassium: 4.9 mmol/L (ref 3.5–5.1)
Potassium: 4.5 mmol/L (ref 3.5–5.1)
Sodium: 138 mmol/L (ref 135–145)
Sodium: 138 mmol/L (ref 135–145)

## 2018-12-02 LAB — HEPATIC FUNCTION PANEL
ALT: 3595 U/L — ABNORMAL HIGH (ref 0–44)
AST: 7556 U/L — ABNORMAL HIGH (ref 15–41)
Albumin: 2.8 g/dL — ABNORMAL LOW (ref 3.5–5.0)
Alkaline Phosphatase: 81 U/L (ref 38–126)
Bilirubin, Direct: 0.7 mg/dL — ABNORMAL HIGH (ref 0.0–0.2)
Indirect Bilirubin: 1 mg/dL — ABNORMAL HIGH (ref 0.3–0.9)
Total Bilirubin: 1.7 mg/dL — ABNORMAL HIGH (ref 0.3–1.2)
Total Protein: 7 g/dL (ref 6.5–8.1)

## 2018-12-02 LAB — POCT I-STAT 7, (LYTES, BLD GAS, ICA,H+H)
Acid-Base Excess: 2 mmol/L (ref 0.0–2.0)
Bicarbonate: 26.9 mmol/L (ref 20.0–28.0)
Calcium, Ion: 0.93 mmol/L — ABNORMAL LOW (ref 1.15–1.40)
HCT: 27 % — ABNORMAL LOW (ref 39.0–52.0)
Hemoglobin: 9.2 g/dL — ABNORMAL LOW (ref 13.0–17.0)
O2 Saturation: 100 %
Patient temperature: 97.4
Potassium: 4.6 mmol/L (ref 3.5–5.1)
Sodium: 136 mmol/L (ref 135–145)
TCO2: 28 mmol/L (ref 22–32)
pCO2 arterial: 38.7 mmHg (ref 32.0–48.0)
pH, Arterial: 7.447 (ref 7.350–7.450)
pO2, Arterial: 305 mmHg — ABNORMAL HIGH (ref 83.0–108.0)

## 2018-12-02 LAB — TRIGLYCERIDES: Triglycerides: 177 mg/dL — ABNORMAL HIGH (ref <150)

## 2018-12-02 LAB — CBC
HCT: 29.6 % — ABNORMAL LOW (ref 39.0–52.0)
Hemoglobin: 8.9 g/dL — ABNORMAL LOW (ref 13.0–17.0)
MCH: 27 pg (ref 26.0–34.0)
MCHC: 30.1 g/dL (ref 30.0–36.0)
MCV: 89.7 fL (ref 80.0–100.0)
Platelets: 258 10*3/uL (ref 150–400)
RBC: 3.3 MIL/uL — ABNORMAL LOW (ref 4.22–5.81)
RDW: 16.7 % — ABNORMAL HIGH (ref 11.5–15.5)
WBC: 10.6 10*3/uL — ABNORMAL HIGH (ref 4.0–10.5)
nRBC: 2.3 % — ABNORMAL HIGH (ref 0.0–0.2)

## 2018-12-02 LAB — GLUCOSE, CAPILLARY
Glucose-Capillary: 171 mg/dL — ABNORMAL HIGH (ref 70–99)
Glucose-Capillary: 172 mg/dL — ABNORMAL HIGH (ref 70–99)
Glucose-Capillary: 158 mg/dL — ABNORMAL HIGH (ref 70–99)
Glucose-Capillary: 127 mg/dL — ABNORMAL HIGH (ref 70–99)

## 2018-12-02 LAB — TROPONIN I (HIGH SENSITIVITY)
Troponin I (High Sensitivity): 7724 ng/L (ref <18)
Troponin I (High Sensitivity): 7035 ng/L (ref <18)
Troponin I (High Sensitivity): 6118 ng/L (ref <18)

## 2018-12-02 LAB — HEPARIN LEVEL (UNFRACTIONATED)
Heparin Unfractionated: <0.1 IU/mL — ABNORMAL LOW (ref 0.30–0.70)
Heparin Unfractionated: <0.1 IU/mL — ABNORMAL LOW (ref 0.30–0.70)

## 2018-12-02 LAB — POTASSIUM
Potassium: 4.5 mmol/L (ref 3.5–5.1)
Potassium: 5.6 mmol/L — ABNORMAL HIGH (ref 3.5–5.1)

## 2018-12-02 LAB — MAGNESIUM: Magnesium: 2.3 mg/dL (ref 1.7–2.4)

## 2018-12-02 LAB — APTT: aPTT: 51 seconds — ABNORMAL HIGH (ref 24–36)

## 2018-12-02 MED ORDER — PRO-STAT SUGAR FREE PO LIQD
60.0000 mL | Freq: Three times a day (TID) | ORAL | Status: DC
  Administered 2018-12-02 – 2018-12-06 (×13): 60 mL
  Filled 2018-12-02 (×11): qty 60

## 2018-12-02 MED ORDER — PRISMASOL BGK 4/2.5 32-4-2.5 MEQ/L REPLACEMENT SOLN
Status: DC
  Administered 2018-12-02: 17:00:00 via INTRAVENOUS_CENTRAL
  Filled 2018-12-02 (×2): qty 5000

## 2018-12-02 MED ORDER — B COMPLEX-C PO TABS
1.0000 | ORAL_TABLET | Freq: Every day | ORAL | Status: DC
  Administered 2018-12-02 – 2018-12-13 (×11): 1
  Filled 2018-12-02 (×12): qty 1

## 2018-12-02 MED ORDER — PRISMASOL BGK 4/2.5 32-4-2.5 MEQ/L REPLACEMENT SOLN
Status: DC
  Administered 2018-12-02 – 2018-12-03 (×2): via INTRAVENOUS_CENTRAL
  Filled 2018-12-02 (×4): qty 5000

## 2018-12-02 MED ORDER — SENNOSIDES-DOCUSATE SODIUM 8.6-50 MG PO TABS
1.0000 | ORAL_TABLET | Freq: Every day | ORAL | Status: DC
  Administered 2018-12-02 – 2018-12-13 (×10): 1
  Filled 2018-12-02 (×11): qty 1

## 2018-12-02 MED ORDER — INSULIN ASPART 100 UNIT/ML ~~LOC~~ SOLN
0.0000 [IU] | SUBCUTANEOUS | Status: DC
  Administered 2018-12-02: 2 [IU] via SUBCUTANEOUS
  Administered 2018-12-02: 1 [IU] via SUBCUTANEOUS
  Administered 2018-12-02: 16:00:00 2 [IU] via SUBCUTANEOUS
  Administered 2018-12-03: 1 [IU] via SUBCUTANEOUS
  Administered 2018-12-03: 2 [IU] via SUBCUTANEOUS
  Administered 2018-12-03: 1 [IU] via SUBCUTANEOUS
  Administered 2018-12-03 (×2): 2 [IU] via SUBCUTANEOUS
  Administered 2018-12-03: 1 [IU] via SUBCUTANEOUS
  Administered 2018-12-03: 2 [IU] via SUBCUTANEOUS
  Administered 2018-12-04 – 2018-12-10 (×11): 1 [IU] via SUBCUTANEOUS

## 2018-12-02 MED ORDER — PRISMASOL BGK 4/2.5 32-4-2.5 MEQ/L IV SOLN
INTRAVENOUS | Status: DC
  Administered 2018-12-02 – 2018-12-03 (×7): via INTRAVENOUS_CENTRAL
  Filled 2018-12-02 (×21): qty 5000

## 2018-12-02 MED ORDER — SODIUM CHLORIDE 0.9 % IV SOLN
INTRAVENOUS | Status: DC
  Administered 2018-12-02 – 2018-12-07 (×4): 250 mL via INTRAVENOUS

## 2018-12-02 MED ORDER — HEPARIN SODIUM (PORCINE) 5000 UNIT/ML IJ SOLN
5000.0000 [IU] | Freq: Three times a day (TID) | INTRAMUSCULAR | Status: DC
  Administered 2018-12-02 – 2018-12-13 (×33): 5000 [IU] via SUBCUTANEOUS
  Filled 2018-12-02 (×33): qty 1

## 2018-12-02 MED ORDER — BISACODYL 10 MG RE SUPP
10.0000 mg | Freq: Every day | RECTAL | Status: DC | PRN
  Administered 2018-12-05: 10 mg via RECTAL
  Filled 2018-12-02: qty 1

## 2018-12-02 MED ORDER — VITAL AF 1.2 CAL PO LIQD
1000.0000 mL | ORAL | Status: DC
  Administered 2018-12-02: 18:00:00 1000 mL

## 2018-12-02 MED ORDER — CALCIUM GLUCONATE-NACL 1-0.675 GM/50ML-% IV SOLN
1.0000 g | Freq: Once | INTRAVENOUS | Status: AC
  Administered 2018-12-02: 1000 mg via INTRAVENOUS
  Filled 2018-12-02: qty 50

## 2018-12-02 NOTE — Progress Notes (Signed)
CRITICAL VALUE ALERT  Critical Value:  High SensitivityTroponin 7.24  Date & Time Notied:  12/02/2018 9:41 AM  (Was not notified by the lab)  Provider Notified: Yes  Orders Received/Actions taken: Awaiting orders at this time

## 2018-12-02 NOTE — Progress Notes (Signed)
Progress Note  Patient Name: [name] Date of Encounter: 12/02/2018  Primary Cardiologist:   No primary care provider on file.   Subjective   Intubated and sedated.   Inpatient Medications    Scheduled Meds: . artificial tears  1 application Both Eyes N9G  . chlorhexidine gluconate (MEDLINE KIT)  15 mL Mouth Rinse BID  . Chlorhexidine Gluconate Cloth  6 each Topical Q0600  . Chlorhexidine Gluconate Cloth  6 each Topical Daily  . guaiFENesin  600 mg Oral BID  . hydrocortisone sod succinate (SOLU-CORTEF) inj  100 mg Intravenous Q8H  . mouth rinse  15 mL Mouth Rinse 10 times per day  . promethazine  12.5 mg Intravenous Once  . sucroferric oxyhydroxide  1,000 mg Oral TID WC   Continuous Infusions: . ceFEPime (MAXIPIME) IV Stopped (12/02/18 0452)  . cisatracurium (NIMBEX) infusion 1 mcg/kg/min (12/02/18 0800)  . epinephrine Stopped (12/01/18 1902)  . famotidine (PEPCID) IV Stopped (12/01/18 1107)  . fentaNYL infusion INTRAVENOUS 225 mcg/hr (12/02/18 0800)  . heparin 1,700 Units/hr (12/02/18 0800)  . midazolam 7 mg/hr (12/02/18 0800)  . norepinephrine (LEVOPHED) Adult infusion 4 mcg/min (12/02/18 0800)  . prismasol BGK 2/2.5 dialysis solution 2,000 mL/hr at 12/02/18 0721  . prismasol BGK 2/2.5 replacement solution 400 mL/hr at 12/02/18 0453  . prismasol BGK 2/2.5 replacement solution 200 mL/hr at 12/01/18 1539  .  sodium bicarbonate  infusion 1000 mL 125 mL/hr at 12/02/18 0800  . vancomycin    . vasopressin (PITRESSIN) infusion - *FOR SHOCK* 0.04 Units/min (12/02/18 0800)   PRN Meds: acetaminophen **OR** acetaminophen, albuterol, alteplase, fentaNYL, heparin, midazolam, ondansetron **OR** ondansetron (ZOFRAN) IV, sodium chloride   Vital Signs    Vitals:   12/02/18 0718 12/02/18 0730 12/02/18 0745 12/02/18 0800  BP: 115/67     Pulse: 69     Resp: (!) 35 (!) 0 (!) 0 (!) 5  Temp:      TempSrc:      SpO2: 100%   100%  Weight:      Height:         Intake/Output Summary (Last 24 hours) at 12/02/2018 0815 Last data filed at 12/02/2018 0800 Gross per 24 hour  Intake 5779.79 ml  Output 4921 ml  Net 858.79 ml   Filed Weights   11/30/18 0826 12/01/18 0444 12/02/18 0401  Weight: (!) 147.4 kg (!) 148 kg (!) 150.3 kg    Telemetry    NSR - Personally Reviewed  ECG    NA - Personally Reviewed  Physical Exam   GEN:   Critically ill appearing Neck:    Difficult to assess JVD Cardiac: RRR, no murmurs, rubs, or gallops.  Respiratory:    Decreased breath sounds GI: Soft, nontender, non-distended  MS:   Diffuse edema; No deformity. Neuro:  Nonfocal  Psych: Normal affect   Labs    Chemistry Recent Labs  Lab 11/30/18 1710  12/01/18 0413  12/01/18 1250  12/01/18 1752 12/01/18 2332 12/02/18 0404 12/02/18 0652  NA 139   < > 138   < >  --    < > 138  --  138 136  K 4.6   < > 6.7*   < > 6.4*   < > 5.9* 5.6* 4.9 4.6  CL 100  --  98  --   --   --  96*  --  97*  --   CO2 21*  --  20*  --   --   --  17*  --  20*  --   GLUCOSE 111*  --  89  --   --   --  164*  --  168*  --   BUN 74*  --  60*  --   --   --  62*  --  50*  --   CREATININE 17.47*  --  13.43*  --   --   --  13.96*  --  9.50*  --   CALCIUM 8.3*  --  8.2*  --   --   --  7.4*  --  7.5*  --   PROT 6.9  --   --   --  6.7  --   --   --  7.0  --   ALBUMIN 2.8*  --  2.9*  --  2.7*  --  2.8*  --  2.8*  2.8*  --   AST 12*  --   --   --  7,277*  --   --   --  7,556*  --   ALT 12  --   --   --  2,757*  --   --   --  3,595*  --   ALKPHOS 42  --   --   --  57  --   --   --  81  --   BILITOT 1.1  --   --   --  1.7*  --   --   --  1.7*  --   GFRNONAA 3*  --  4*  --   --   --  4*  --  6*  --   GFRAA 3*  --  5*  --   --   --  4*  --  7*  --   ANIONGAP 18*  --  20*  --   --   --  25*  --  21*  --    < > = values in this interval not displayed.     Hematology Recent Labs  Lab 12/01/18 0413  12/01/18 0636  12/01/18 1656 12/02/18 0404 12/02/18 0652  WBC 11.0*  --  12.2*  --    --  10.6*  --   RBC 3.41*  --  3.18*  --   --  3.30*  --   HGB 9.2*   < > 8.6*   < > 10.2* 8.9* 9.2*  HCT 31.9*   < > 29.8*   < > 30.0* 29.6* 27.0*  MCV 93.5  --  93.7  --   --  89.7  --   MCH 27.0  --  27.0  --   --  27.0  --   MCHC 28.8*  --  28.9*  --   --  30.1  --   RDW 16.7*  --  16.6*  --   --  16.7*  --   PLT 369  --  343  --   --  258  --    < > = values in this interval not displayed.    Cardiac EnzymesNo results for input(s): TROPONINI in the last 168 hours. No results for input(s): TROPIPOC in the last 168 hours.   BNPNo results for input(s): BNP, PROBNP in the last 168 hours.   DDimer No results for input(s): DDIMER in the last 168 hours.   Radiology    Dg Chest Port 1 View  Result Date: 12/02/2018 CLINICAL DATA:  Respiratory failure. EXAM: PORTABLE CHEST 1 VIEW COMPARISON:  Radiograph of December 01, 2018. FINDINGS: Stable cardiomediastinal silhouette. Endotracheal and nasogastric tubes are unchanged in position. Slightly improved bilateral lung opacities are noted suggesting improving pneumonia. Dominant opacity remains in right lung base. Stable position of left subclavian catheter which is directed into left internal jugular vein. Bony thorax is unremarkable. No pneumothorax or pleural effusion is noted. IMPRESSION: Mildly improved bilateral lung opacities are noted suggesting improving pneumonia. Electronically Signed   By: Marijo Conception M.D.   On: 12/02/2018 07:47   Dg Chest Port 1 View  Result Date: 12/01/2018 CLINICAL DATA:  Oxygen desaturation EXAM: PORTABLE CHEST 1 VIEW COMPARISON:  Yesterday FINDINGS: Diffuse airspace opacity which appears somewhat less dense than before, likely related to better lung volumes. Endotracheal tube tip is at the clavicular heads. The orogastric tube reaches the stomach. Left subclavian catheter rises in the IJ, tip not seen. This finding is known to the service based on prior documented communication. IMPRESSION: Stable hardware  positioning including left subclavian catheter terminating in the left IJ. Mildly improved lung volumes but continued extensive airspace disease. Electronically Signed   By: Monte Fantasia M.D.   On: 12/01/2018 07:10   Dg Chest Port 1 View  Result Date: 11/30/2018 CLINICAL DATA:  43 year old male status post central line placement. EXAM: PORTABLE CHEST 1 VIEW COMPARISON:  Earlier radiograph dated 11/30/2018 FINDINGS: A left subclavian central line extends superiorly in the left internal jugular vein. Recommend removal and repositioning. Endotracheal tube remains above the carina and enteric tube extends inferiorly beyond the inferior margin of the image. Bilateral confluent airspace opacities with overall interval worsening since the prior radiograph. No pneumothorax. Small pleural effusions may be present. Stable cardiomegaly. No acute osseous pathology. IMPRESSION: 1. Left subclavian central line extends superiorly in the left internal jugular vein. Recommend removal and repositioning. 2. Interval worsening of the bilateral airspace opacities. 3. No pneumothorax. These results were called by telephone at the time of interpretation on 11/30/2018 at 7:07 pm to Dr. [name] , who verbally acknowledged these results. Electronically Signed   By: Anner Crete M.D.   On: 11/30/2018 19:20   Dg Chest Portable 1 View  Result Date: 11/30/2018 CLINICAL DATA:  Intubation.  ESRD EXAM: PORTABLE CHEST 1 VIEW COMPARISON:  11/30/2018 FINDINGS: Endotracheal tube in good position.  NG tube enters the stomach. Diffuse bilateral airspace disease with progression from earlier today. No significant pleural effusion. IMPRESSION: Endotracheal tube in good position Significant progression of diffuse bilateral airspace disease which may represent edema or pneumonia. Electronically Signed   By: Franchot Gallo M.D.   On: 11/30/2018 15:23   Dg Chest Portable 1 View  Result Date: 11/30/2018 CLINICAL DATA:  Fever and hypoxia  EXAM: PORTABLE CHEST 1 VIEW COMPARISON:  May 10, 2018 FINDINGS: There is airspace consolidation in the right base region. There is more subtle airspace opacity in the left upper lobe. Heart is enlarged with mild pulmonary venous hypertension. No adenopathy. There is aortic atherosclerosis. No bone lesions. IMPRESSION: Areas of airspace opacity felt to represent pneumonia in the right base and left upper lobe regions. Consolidation is greatest in the right base region. There is cardiomegaly with a degree of pulmonary vascular congestion. No adenopathy evident. Aortic Atherosclerosis (ICD10-I70.0). Electronically Signed   By: Lowella Grip III M.D.   On: 11/30/2018 09:09    Cardiac Studies   ECHO:   1. The left ventricle has normal systolic function, with an ejection fraction of 55-60%. The cavity size was normal. There is moderately  increased left ventricular wall thickness. Left ventricular diastolic Doppler parameters are consistent with  impaired relaxation. No evidence of left ventricular regional wall motion abnormalities. D-shaped interventricular septum suggestive of RV pressure/volume overload.  2. Right atrial size was mildly dilated.  3. Trivial pericardial effusion is present.  4. The aortic valve is tricuspid. Mild calcification of the aortic valve. No stenosis of the aortic valve.  5. The aortic root is normal in size and structure.  6. There is mild dilatation of the ascending aorta measuring 42 mm.  7. The right ventricle has severely reduced systolic function. The cavity was moderately enlarged. There is no increase in right ventricular wall thickness.  8. The inferior vena cava was dilated in size with <50% respiratory variability. PA systolic pressure 45 mmHg.  9. No evidence of mitral valve stenosis. No significant mitral regurgitation.  Patient Profile     43 y.o. male with a hx of chronic diastolic HF, ESRD s/p renal transplant in 2014 at [facility] that lasted until 2019  and placed back on HD, T2DM, HTN, morbid obesity and OSA who is being seen for the evaluation of elevated troponin at the request of Dr. Nelda Marseille, Pulmonary Critical Care Medicine.   Assessment & Plan    Elevated Troponin:   Demand ischemia.   Check another hsTrop to see trend.  No indication for acute invasive evaluation.    I will check another hsTrop.   If the hsTrop is not continuing to trend up significantly I would suggest stopping heparin.  He might be auto anticoagulated with his shock liver.  (See below).   RV Dysfuntion.   Able to wean pressors.   I suspect his RV is coming back down as the acute illness improved.    HYPERKALEMIA:  Per nephrology.    ELEVATED LIVER ENZYMES:  Shock liver.  Possibly some component of passive congestion with RV dysfunction.  Follow enzymes.    Acute Hypoxic Respiratory Failure:       CXR mildly improved.  Vent support and management for possible pneumonia per CCM.      ESRD:    Per nephrology.    For questions or updates, please contact [place] Please consult [email or web address] for contact info under Cardiology/STEMI.   Signed, Minus Breeding, MD  12/02/2018, 8:15 AM

## 2018-12-02 NOTE — Progress Notes (Signed)
CRITICAL VALUE ALERT  Critical Value:  High Sensitivity troponin 6.1  Date & Time Notied:  12/02/2018. 4:20 PM  (Lab did not call)  Provider Notified: Yes  Orders Received/Actions taken: none at this time

## 2018-12-02 NOTE — Progress Notes (Signed)
Initial Nutrition Assessment   RD working remotely.   DOCUMENTATION CODES:   Morbid obesity  INTERVENTION:   Discussed nutrition poc with CCM and received verbal order to initiate trial of trickle TF  Tube Feeding:  Initiate Vital AF 1.2 at 20 ml/hr Goal: Vital AF 1.2 at 50 ml/hr with Pro-Stat 60 mL TID Goal rate provides 2040 kcals, 180 g of protein and 972 mL of free water  Add B-complex with C; change to Rena-Vit when pt able to take po  Consider holding phosphorus binder therapy (Velphoro TID with meals) while on CRRT  NUTRITION DIAGNOSIS:   Inadequate oral intake related to acute illness as evidenced by NPO status.  GOAL:   Patient will meet greater than or equal to 90% of their needs  MONITOR:   Labs, Weight trends, TF tolerance, Skin  REASON FOR ASSESSMENT:   Ventilator    ASSESSMENT:   43 yo male admitted with hypoxic respiratory failure with pulmonary edema, ARDS, possible pneumonia requiring intubation and multiorgan failure after missed HD, in severe shock on unknown etiology, elevated LFTs with shock liver. PMH includes ESRD on HD, hx of kidney transplant, HTN, DM, CHF, GERD   6/23 Admit, intubated 6/24 CRRT initiated  Pt remains on CRRT with UF as tolerated 75-150 ml/hr Patient is currently intubated on ventilator support, pressor requirements improving (off epinehprine, levophed trending down, vasopressin), pt remains chemically paralyzed on nimbex MV: 17.7 L/min Temp (24hrs), Avg:98.6 F (37 C), Min:97.4 F (36.3 C), Max:100.3 F (37.9 C)  Propofol: NONE  Unable to obtain diet and weight history at this time  EDW 146 kg per Outpatient records Admission wt 147.4 kg; current wt 150.3 kg. Net + 1.3 L per I/O flow sheet.   Pt is anuric  Hyperphosphatemia likely improved post initiation of CRRT, no new labs   Labs: potassium wdl, phosphorus 6.4 (5/25 prior to initiation of CRRT) Meds: velphoro with meals, ss novolog, sodium bicarb  drip  NUTRITION - FOCUSED PHYSICAL EXAM:  Unable to assess  Diet Order:   Diet Order            Diet NPO time specified  Diet effective now              EDUCATION NEEDS:   Not appropriate for education at this time  Skin:  Skin Assessment: Reviewed RN Assessment  Last BM:  no documented BM  Height:   Ht Readings from Last 1 Encounters:  12/01/18 6\' 2"  (1.88 m)    Weight:   Wt Readings from Last 1 Encounters:  12/02/18 (!) 150.3 kg    Ideal Body Weight:  86.3 kg  BMI:  Body mass index is 42.54 kg/m.  Estimated Nutritional Needs:   Kcal:  0626-9485 kcals  Protein:  173-216 g of protein  Fluid:  1000 mL plus UOP   [employer] MS, RDN, LDN, CNSC [phone] Pager  [phone] Weekend/On-Call Pager

## 2018-12-02 NOTE — Progress Notes (Signed)
PCCM brief communication note   I spoke with the patient's father, Carloyn Manner on the phone. I provided daily updates regarding current plan of care.  In common terms, we discussed CRRT, respiratory failure with improving pulmonary edema and improving oxygenation, elevated troponins, transaminitis, shock of unknown etiology. We discussed the patient's pain and sedation. We discussed the discontinuation of nimbex. All questions answered.    Eliseo Gum MSN, AGACNP-BC [place] [phone] If no answer, [phone] 12/02/2018, 1:36 PM

## 2018-12-02 NOTE — Progress Notes (Signed)
[facility] Progress Note  Subjective: on CRRT overnight, cXR looks better. Was + 1L yest and - 600 cc today so far.    Vitals:   12/02/18 1215 12/02/18 1230 12/02/18 1245 12/02/18 1300  BP:   (!) 96/42   Pulse: 71 76 78 75  Resp: (!) 35 (!) 31 (!) 0 (!) 5  Temp:      TempSrc:      SpO2: 100% 100% 100% 100%  Weight:      Height:        Inpatient medications: . B-complex with vitamin C  1 tablet Per Tube Daily  . chlorhexidine gluconate (MEDLINE KIT)  15 mL Mouth Rinse BID  . Chlorhexidine Gluconate Cloth  6 each Topical Q0600  . Chlorhexidine Gluconate Cloth  6 each Topical Daily  . feeding supplement (PRO-STAT SUGAR FREE 64)  60 mL Per Tube TID  . hydrocortisone sod succinate (SOLU-CORTEF) inj  100 mg Intravenous Q8H  . insulin aspart  0-9 Units Subcutaneous Q4H  . mouth rinse  15 mL Mouth Rinse 10 times per day  . promethazine  12.5 mg Intravenous Once  . senna-docusate  1 tablet Per Tube Daily  . sucroferric oxyhydroxide  1,000 mg Oral TID WC   . sodium chloride Stopped (12/02/18 1216)  . ceFEPime (MAXIPIME) IV Stopped (12/02/18 0452)  . famotidine (PEPCID) IV Stopped (12/02/18 9373)  . feeding supplement (VITAL AF 1.2 CAL)    . fentaNYL infusion INTRAVENOUS 150 mcg/hr (12/02/18 1300)  . heparin 1,700 Units/hr (12/02/18 1300)  . midazolam Stopped (12/02/18 1141)  . norepinephrine (LEVOPHED) Adult infusion Stopped (12/02/18 1253)  . prismasol BGK 2/2.5 dialysis solution 2,000 mL/hr at 12/02/18 1215  . prismasol BGK 2/2.5 replacement solution 400 mL/hr at 12/02/18 0453  . prismasol BGK 2/2.5 replacement solution 200 mL/hr at 12/01/18 1539  .  sodium bicarbonate  infusion 1000 mL Stopped (12/02/18 0953)  . vancomycin    . vasopressin (PITRESSIN) infusion - *FOR SHOCK* 0.04 Units/min (12/02/18 1300)   acetaminophen **OR** acetaminophen, albuterol, alteplase, bisacodyl, fentaNYL, heparin, midazolam, ondansetron **OR** ondansetron (ZOFRAN) IV, sodium chloride    Exam: Intubated on vent  No jvd  Chest clear ant/ lat  Cor tachy no RG  ABd obese dec'd BS  ext 1+ LE edema  Neuro on vent, sedated   Outpt HD: TTS [place]  4.5h  500/1.5  146kg   2/2.25 bath  Hep 11400   L thigh AVG - parsabiv  - mircera 150 q 2wks - hectorol 4 ug    Assessment/ Plan: 1. SOB/ hypoxemia/ acute resp failure: improving CXR 2. Shock - pressors weaning down 3. ESRD on HD TTS. Cont CRRT, UF as tolerated 75- 150 cc/hr 4. Fever - ? PNA, started on IV abx 5. H/o failed renal Tx 6. Morbid obesity 7. H/o HTN 8. DM2   [facility] 12/02/2018, 1:31 PM  Iron/TIBC/Ferritin/ %Sat    Component Value Date/Time   IRON 46 05/26/2018   TIBC 202 05/26/2018   FERRITIN 1,155 05/26/2018   IRONPCTSAT 59 (H) 05/11/2018 0235   Recent Labs  Lab 11/30/18 1710  12/02/18 0404 12/02/18 0652 12/02/18 0754  NA 139   < > 138 136  --   K 4.6   < > 4.9 4.6 4.5  CL 100   < > 97*  --   --   CO2 21*   < > 20*  --   --   GLUCOSE 111*   < >  168*  --   --   BUN 74*   < > 50*  --   --   CREATININE 17.47*   < > 9.50*  --   --   CALCIUM 8.3*   < > 7.5*  --   --   PHOS 8.2*   < > 6.4*  --   --   ALBUMIN 2.8*   < > 2.8*  2.8*  --   --   INR 1.5*  --   --   --   --    < > = values in this interval not displayed.   Recent Labs  Lab 12/02/18 0404  AST 7,556*  ALT 3,595*  ALKPHOS 81  BILITOT 1.7*  PROT 7.0   Recent Labs  Lab 12/02/18 0404 12/02/18 0652  WBC 10.6*  --   HGB 8.9* 9.2*  HCT 29.6* 27.0*  PLT 258  --

## 2018-12-02 NOTE — Progress Notes (Signed)
CRITICAL VALUE ALERT  Critical Value:  High Sensitivity Troponin 7.0  Date & Time Notied:  12/02/2018 12:54 PM    Provider Notified: Yes  Orders Received/Actions taken: none at this time

## 2018-12-02 NOTE — Progress Notes (Signed)
NAME:  [name], MRN:  [id number], DOB:  08-25-1975, LOS: 2 ADMISSION DATE:  11/30/2018, CONSULTATION DATE:  11/30/18 REFERRING MD:  Wilson Singer  CHIEF COMPLAINT:  SOB   Brief History   [name] is a 43 y.o. male who presented to [facility] ED 6/23 with respiratory distress and hypoxic respiratory failure presumed due to acute pulmonary edema after missing HD. Also possible HCAP. Required intubation in ED due to extremis.  History of present illness   Pt is encephelopathic; therefore, this HPI is obtained from chart review. [name] is a 43 y.o. male who has a PMH including but not limited to ESRD on HD TTS after kidney transplant failure (transplanted May 2014 and failed Dec 2019 - was being followed at [facility] HTN, DM, CHF, morbid obesity, OSA on CPAP (see "past medical history" for rest).  He presented to [facility] ED 6/23 with SOB x 1 day.  He had missed HD on 6/18 so had a session 6/19 then [place] the beach.  Went for his scheduled session 6/23 and was found to have mild fever (100.4) along with worsening SOB; therefore, was sent to ED for further evaluation.   In ED, CXR prior to intubation demonstrated vascular congestion and possible basilar infiltrate.  He was in extremis and failed BiPAP due to vomiting.  He required emergent intubation due to desaturations as low as 50's.  No recent fevers/chills/sweats, chest pain, myalgias, exposures to known sick contacts.  COVID negative.   Past Medical History  OSA on CPAP, PAF, HTN, combined CHF (echo from 2019 with EF 55-60%, G2DD), ESRD on HD s/p renal transplant 2014, DM, hypothyroidism.  Significant Hospital Events   6/23 > admit. 6/24> worsening shock, CRRT initiated 6/25> shock improving however troponins continue to climb, LFTs remain elevated. Continues on CRRt. CXr with some improvement   Consults:  Nephrology. Cardiology Vascular Surgery   Procedures:  ETT 6/23 >  L [place] CVC 6/23>   R fem trialysis catheter 6/24>   Significant Diagnostic Tests:  CXR 6/23 > vascular congestion, basilar consolidation R > L. CXR 6/24> some improvement in lung volumes, diffuse bilateral ASD  ECHO 6/24> LVEF 55-60%, RV D-shaped interventricular septum suggests pressure/volume overload. Detailed report available in CV procedure.  CXR 6/25> improving bilateral ASD with persisting bibasilar opacities, R>L  Micro Data:  Blood 6/23 > no growth  Sputum 6/23 > no organisms seen  COVID 6/23 > negative.  Antimicrobials:  Vanc 6/23 >  Cefepime 6/23 >   Interim history/subjective:  Started on CRRT yesterday and has been tolerating well Off Epi gtt and continuing to wean NE. Vaso remains at consistent dose  Weaning FiO2 on vent this morning, but does continue on cis.   Objective:  Blood pressure 115/67, pulse 69, temperature (!) 97.4 F (36.3 C), temperature source Axillary, resp. rate (!) 5, height 6\' 2"  (1.88 m), weight (!) 150.3 kg, SpO2 100 %.    Vent Mode: PRVC FiO2 (%):  [60 %-100 %] 60 % Set Rate:  [30 bmp-35 bmp] 35 bmp Vt Set:  [490 mL-560 mL] 560 mL PEEP:  [16 cmH20-18 cmH20] 16 cmH20 Plateau Pressure:  [29 cmH20-33 cmH20] 29 cmH20   Intake/Output Summary (Last 24 hours) at 12/02/2018 0908 Last data filed at 12/02/2018 0800 Gross per 24 hour  Intake 5696.55 ml  Output 4921 ml  Net 775.55 ml   Filed Weights   11/30/18 0826 12/01/18 0444 12/02/18 0401  Weight: (!) 147.4 kg (!) 148 kg Marland Kitchen)  150.3 kg    Examination:  General: Obese adult male, intubated, sedated, chemically paralyzed, on CRRT, supine in bed Neuro: Sedated and chemically paralyzed  HEENT: NCAT, ETT secure OGT secure, trachea midline, pink mmm Cardiovascular: RRR s1s2 no rgm, capillary refill < 3  Lungs: Diminished bibasilar breath sounds, scattered bilateral rhonchi. Symmetrical chest expansion  Abdomen: Obese soft round, non-distended. Hypoactive x4  Musculoskeletal: Symmetrical bulk and tone. Anasarca appreciated. No cyanosis, no clubbing   Skin: clean dry cool, without rash.   Assessment & Plan:   Acute hypoxic respiratory failure requiring intubation -pulmonary edema in setting of missed iHD -6/25 interval improvement in bilateral ASD, some persistent bibasilar opacities  -ABG markedly improved this morning, weaning vent settings  P - Continue mechanical ventilatory support  - AM ABG with much improvement-- 7.44/38.7/305/28 (FiO2 weaned after this gas to 60%)  - Will dc bicarb gtt  -Wean FiO2/ PEEP for SpO2  - Consider dc nimbex, will discuss with PCCM attending  - AM CXR - Continue pulm hygiene   Shock, improving  -etiology unknown. No infectious source identified and cardiac workup does not support cardiogenic etiology  - Tracheal aspirate 6/24 reveals no organisms  - Bcx 6/24 no growth  - SARS CoV2 negative  P - Continue empiric vanc/cefepime. Re-evaluate tomorrow (6/26) and if still no growth on Bcx, can possibly evaluate for dc.  - MAP goal >65 - Wean pressors as able for MAP goal - Will dc epi as patient now weaned off  Transaminitis -likely shock liver in setting of above, possible contributing factor acute RV dysfunction causing hepatic congestion  P -Continue to trend LFTs  -Hope to see some improvement as shock improves  Elevated troponin, likely demand ischemia -troponin > 3000 on 6/24, 7724 on 6/25  - New troponin algorithm indicates that 1,000 to <10,000 can be seen in critical illness, cardiomyopathy. Troponin >10,000 is more indicative of AMI  P - cardiology following - Per cardiology note, if follow up trop down trending, can dc heparin gtt. Follow up troponin is >7700 which is greater than prior  - Will continue heparin gtt for now per follow up with cardiology  - Recheck trop   RV dysfunction -revealed on ECHO 6/24, suggestive of volume overload  P Fluid removal per CRRT RV dysfunction likely in setting of acute critical illness, continue to wean pressors as able   ESRD on OP HD  following failed renal transplant  - iHD initially inpatient however given shock was transitioned to CRRT  P - Nephrology following appreciate CRRT management - BID renal function panels    Hx Hypertension. - holding antihypertensives in setting of shock   Anemia of chronic disease. - Transfuse for Hgb < 7.  DM P -SSI   Hypocalcemia P 1g calcium gluconate  Constipation -in setting of opioids, decreased mobility, critical illness P  PRN suppository   Inadequate PO intake P Currently holding EN as patient chemically paralyzed, favor initiating trickle feeds when cis is discontinued   Goals of Care: -Family to bedside 6/24 and spoke with Dr. Vaughan Browner. Remains full code, full scope of medical interventions   Best Practice:  Diet: NPO  Pain/Anxiety/Delirium protocol (if indicated): Fent gtt midaz gtt  VAP protocol (if indicated): In place. DVT prophylaxis: Heparin gtt  GI prophylaxis: Pepcid  Glucose control: SSI Mobility: Bedrest. Code Status: Full. Family Communication:Family update pending 6/25  Disposition: ICU.  Labs   CBC: Recent Labs  Lab 11/30/18 0855  11/30/18 1710  12/01/18 0413  12/01/18 0636 12/01/18 1302 12/01/18 1656 12/02/18 0404 12/02/18 0652  WBC 11.7*  --  10.4  --  11.0*  --  12.2*  --   --  10.6*  --   NEUTROABS 9.7*  --   --   --   --   --   --   --   --   --   --   HGB 8.1*   < > 8.3*   < > 9.2*   < > 8.6* 10.5* 10.2* 8.9* 9.2*  HCT 25.7*   < > 27.3*   < > 31.9*   < > 29.8* 31.0* 30.0* 29.6* 27.0*  MCV 86.8  --  88.3  --  93.5  --  93.7  --   --  89.7  --   PLT 327  --  356  --  369  --  343  --   --  258  --    < > = values in this interval not displayed.   Basic Metabolic Panel: Recent Labs  Lab 11/30/18 0855  11/30/18 1710  12/01/18 0413  12/01/18 1302 12/01/18 1656 12/01/18 1752 12/01/18 2332 12/02/18 0404 12/02/18 0652 12/02/18 0754  NA 136   < > 139   < > 138   < > 133* 135 138  --  138 136  --   K 4.8   < > 4.6   < >  6.7*   < > 6.0* 5.5* 5.9* 5.6* 4.9 4.6 4.5  CL 96*  --  100  --  98  --   --   --  96*  --  97*  --   --   CO2 23  --  21*  --  20*  --   --   --  17*  --  20*  --   --   GLUCOSE 106*  --  111*  --  89  --   --   --  164*  --  168*  --   --   BUN 69*  --  74*  --  60*  --   --   --  62*  --  50*  --   --   CREATININE 15.23*  --  17.47*  --  13.43*  --   --   --  13.96*  --  9.50*  --   --   CALCIUM 8.7*  --  8.3*  --  8.2*  --   --   --  7.4*  --  7.5*  --   --   MG 2.4  --  2.4  --  2.4  --   --   --   --   --  2.3  --   --   PHOS 6.4*  --  8.2*  --  9.3*  --   --   --  9.1*  --  6.4*  --   --    < > = values in this interval not displayed.   GFR: Estimated Creatinine Clearance: 15.4 mL/min (A) (by C-G formula based on SCr of 9.5 mg/dL (H)). Recent Labs  Lab 11/30/18 0855 11/30/18 1710 12/01/18 0413 12/01/18 0636 12/02/18 0404  PROCALCITON  --  16.32  --   --   --   WBC 11.7* 10.4 11.0* 12.2* 10.6*  LATICACIDVEN 1.2 2.6*  --   --   --    Liver Function Tests: Recent Labs  Lab 11/30/18 1710 12/01/18 0413  12/01/18 1250 12/01/18 1752 12/02/18 0404  AST 12*  --  7,277*  --  7,556*  ALT 12  --  2,757*  --  3,595*  ALKPHOS 42  --  57  --  81  BILITOT 1.1  --  1.7*  --  1.7*  PROT 6.9  --  6.7  --  7.0  ALBUMIN 2.8* 2.9* 2.7* 2.8* 2.8*  2.8*   No results for input(s): LIPASE, AMYLASE in the last 168 hours. No results for input(s): AMMONIA in the last 168 hours. ABG    Component Value Date/Time   PHART 7.447 12/02/2018 0652   PCO2ART 38.7 12/02/2018 0652   PO2ART 305.0 (H) 12/02/2018 0652   HCO3 26.9 12/02/2018 0652   TCO2 28 12/02/2018 0652   ACIDBASEDEF 8.0 (H) 12/01/2018 1656   O2SAT 100.0 12/02/2018 0652    Coagulation Profile: Recent Labs  Lab 11/30/18 1710  INR 1.5*   Cardiac Enzymes: No results for input(s): CKTOTAL, CKMB, CKMBINDEX, TROPONINI in the last 168 hours. HbA1C: Hgb A1c MFr Bld  Date/Time Value Ref Range Status  05/11/2018 02:35 AM 5.8 (H)  4.8 - 5.6 % Final    Comment:    (NOTE) Pre diabetes:          5.7%-6.4% Diabetes:              >6.4% Glycemic control for   <7.0% adults with diabetes   01/25/2018 08:51 AM 6.3 4.6 - 6.5 % Final    Comment:    Glycemic Control Guidelines for People with Diabetes:Non Diabetic:  <6%Goal of Therapy: <7%Additional Action Suggested:  >8%    CBG: Recent Labs  Lab 12/01/18 1508 12/01/18 1939 12/01/18 2343 12/02/18 0336 12/02/18 0736  GLUCAP 147* 138* 149* 171* 172*     Critical care time: 55 minutes     Eliseo Gum MSN, AGACNP-BC [place] [phone] If no answer, [phone] 12/02/2018, 9:08 AM

## 2018-12-03 ENCOUNTER — Inpatient Hospital Stay (HOSPITAL_COMMUNITY): Payer: 59

## 2018-12-03 LAB — TRIGLYCERIDES: Triglycerides: 195 mg/dL — ABNORMAL HIGH (ref <150)

## 2018-12-03 LAB — CULTURE, RESPIRATORY W GRAM STAIN: Culture: NO GROWTH

## 2018-12-03 LAB — CBC
HCT: 25.1 % — ABNORMAL LOW (ref 39.0–52.0)
Hemoglobin: 7.7 g/dL — ABNORMAL LOW (ref 13.0–17.0)
MCH: 26.8 pg (ref 26.0–34.0)
MCHC: 30.7 g/dL (ref 30.0–36.0)
MCV: 87.5 fL (ref 80.0–100.0)
Platelets: 196 10*3/uL (ref 150–400)
RBC: 2.87 MIL/uL — ABNORMAL LOW (ref 4.22–5.81)
RDW: 16.6 % — ABNORMAL HIGH (ref 11.5–15.5)
WBC: 9.5 10*3/uL (ref 4.0–10.5)
nRBC: 5.7 % — ABNORMAL HIGH (ref 0.0–0.2)

## 2018-12-03 LAB — POCT I-STAT 7, (LYTES, BLD GAS, ICA,H+H)
Acid-Base Excess: 3 mmol/L — ABNORMAL HIGH (ref 0.0–2.0)
Acid-base deficit: 1 mmol/L (ref 0.0–2.0)
Bicarbonate: 23.6 mmol/L (ref 20.0–28.0)
Bicarbonate: 26.8 mmol/L (ref 20.0–28.0)
Calcium, Ion: 1.07 mmol/L — ABNORMAL LOW (ref 1.15–1.40)
Calcium, Ion: 1.14 mmol/L — ABNORMAL LOW (ref 1.15–1.40)
HCT: 29 % — ABNORMAL LOW (ref 39.0–52.0)
HCT: 26 % — ABNORMAL LOW (ref 39.0–52.0)
Hemoglobin: 9.9 g/dL — ABNORMAL LOW (ref 13.0–17.0)
Hemoglobin: 8.8 g/dL — ABNORMAL LOW (ref 13.0–17.0)
O2 Saturation: 88 %
O2 Saturation: 98 %
Patient temperature: 98.6
Potassium: 4.4 mmol/L (ref 3.5–5.1)
Potassium: 4.4 mmol/L (ref 3.5–5.1)
Sodium: 137 mmol/L (ref 135–145)
Sodium: 137 mmol/L (ref 135–145)
TCO2: 25 mmol/L (ref 22–32)
TCO2: 28 mmol/L (ref 22–32)
pCO2 arterial: 37 mmHg (ref 32.0–48.0)
pCO2 arterial: 39 mmHg (ref 32.0–48.0)
pH, Arterial: 7.413 (ref 7.350–7.450)
pH, Arterial: 7.445 (ref 7.350–7.450)
pO2, Arterial: 54 mmHg — ABNORMAL LOW (ref 83.0–108.0)
pO2, Arterial: 109 mmHg — ABNORMAL HIGH (ref 83.0–108.0)

## 2018-12-03 LAB — RENAL FUNCTION PANEL
Albumin: 2.6 g/dL — ABNORMAL LOW (ref 3.5–5.0)
Anion gap: 14 (ref 5–15)
BUN: 51 mg/dL — ABNORMAL HIGH (ref 6–20)
CO2: 22 mmol/L (ref 22–32)
Calcium: 8.4 mg/dL — ABNORMAL LOW (ref 8.9–10.3)
Chloride: 102 mmol/L (ref 98–111)
Creatinine, Ser: 6.58 mg/dL — ABNORMAL HIGH (ref 0.61–1.24)
GFR calc Af Amer: 11 mL/min — ABNORMAL LOW (ref >60)
GFR calc non Af Amer: 9 mL/min — ABNORMAL LOW (ref >60)
Glucose, Bld: 160 mg/dL — ABNORMAL HIGH (ref 70–99)
Phosphorus: 4.2 mg/dL (ref 2.5–4.6)
Potassium: 4.5 mmol/L (ref 3.5–5.1)
Sodium: 138 mmol/L (ref 135–145)

## 2018-12-03 LAB — GLUCOSE, CAPILLARY
Glucose-Capillary: 127 mg/dL — ABNORMAL HIGH (ref 70–99)
Glucose-Capillary: 128 mg/dL — ABNORMAL HIGH (ref 70–99)
Glucose-Capillary: 132 mg/dL — ABNORMAL HIGH (ref 70–99)
Glucose-Capillary: 135 mg/dL — ABNORMAL HIGH (ref 70–99)
Glucose-Capillary: 145 mg/dL — ABNORMAL HIGH (ref 70–99)
Glucose-Capillary: 149 mg/dL — ABNORMAL HIGH (ref 70–99)
Glucose-Capillary: 161 mg/dL — ABNORMAL HIGH (ref 70–99)
Glucose-Capillary: 168 mg/dL — ABNORMAL HIGH (ref 70–99)

## 2018-12-03 LAB — MAGNESIUM: Magnesium: 2.5 mg/dL — ABNORMAL HIGH (ref 1.7–2.4)

## 2018-12-03 LAB — PHOSPHORUS: Phosphorus: 4 mg/dL (ref 2.5–4.6)

## 2018-12-03 MED ORDER — VITAL AF 1.2 CAL PO LIQD
1000.0000 mL | ORAL | Status: DC
  Administered 2018-12-03: 1000 mL

## 2018-12-03 MED ORDER — ASPIRIN 81 MG PO CHEW
81.0000 mg | CHEWABLE_TABLET | Freq: Once | ORAL | Status: AC
  Administered 2018-12-03: 09:00:00 81 mg
  Filled 2018-12-03: qty 1

## 2018-12-03 MED ORDER — MIDODRINE HCL 5 MG PO TABS
10.0000 mg | ORAL_TABLET | Freq: Three times a day (TID) | ORAL | Status: DC
  Administered 2018-12-03 – 2018-12-05 (×6): 10 mg via ORAL
  Filled 2018-12-03 (×6): qty 2

## 2018-12-03 MED ORDER — SODIUM CHLORIDE 0.9 % IV SOLN
INTRAVENOUS | Status: DC | PRN
  Administered 2018-12-03: 250 mL via INTRAVENOUS

## 2018-12-03 MED ORDER — SODIUM CHLORIDE 0.9 % IV SOLN
1.0000 g | INTRAVENOUS | Status: AC
  Administered 2018-12-04 – 2018-12-06 (×3): 1 g via INTRAVENOUS
  Filled 2018-12-03 (×3): qty 1

## 2018-12-03 NOTE — Progress Notes (Addendum)
Pharmacy Antibiotic Note  [name] is a 43 y.o. male admitted on 11/30/2018 with SOB and fever.  Patient arrived at HD center and then transferred to [facility] before starting HD session.  Pharmacy has been consulted for cefepime dosing for sepsis/pneumonia. Tmax 98.3. WBC 9.5.  Plan: Cefepime 1 g IV Q24H Monitor HD schedule/tolerance. Continue to monitor WBC, fever curve, and culture data.  Height: 6\' 2"  (188 cm) Weight: (!) 324 lb 1.2 oz (147 kg) IBW/kg (Calculated) : 82.2  Temp (24hrs), Avg:97.6 F (36.4 C), Min:96.4 F (35.8 C), Max:98.3 F (36.8 C)  Recent Labs  Lab 11/30/18 0855 11/30/18 1710 12/01/18 0413 12/01/18 0636 12/01/18 1752 12/02/18 0404 12/02/18 1500 12/03/18 0404  WBC 11.7* 10.4 11.0* 12.2*  --  10.6*  --  9.5  CREATININE 15.23* 17.47* 13.43*  --  13.96* 9.50* 7.93* 6.58*  LATICACIDVEN 1.2 2.6*  --   --   --   --   --   --     Estimated Creatinine Clearance: 21.9 mL/min (A) (by C-G formula based on SCr of 6.58 mg/dL (H)).    No Known Allergies  Vanc 6/23 >>6/26 Cefepime 6/23 >>  6/23 covid - neg 6/23 MRSA- neg 6/23 Bcx: ngtd 6/24 Resp cx: no organism seen, Final  Vassie Moselle, PharmD Candidate 12/03/2018, 2:34 PM

## 2018-12-03 NOTE — Progress Notes (Signed)
NAME:  [name], MRN:  [id number], DOB:  1976/04/21, LOS: 3 ADMISSION DATE:  11/30/2018, CONSULTATION DATE:  11/30/18 REFERRING MD:  Wilson Singer  CHIEF COMPLAINT:  SOB   Brief History   [name] is a 43 y.o. male who presented to [facility] ED 6/23 with respiratory distress and hypoxic respiratory failure presumed due to acute pulmonary edema after missing HD. Also possible HCAP. Required intubation in ED due to extremis.  History of present illness   Pt is encephelopathic; therefore, this HPI is obtained from chart review. JAHAAN [name] is a 43 y.o. male who has a PMH including but not limited to ESRD on HD TTS after kidney transplant failure (transplanted May 2014 and failed Dec 2019 - was being followed at [facility]), HTN, DM, CHF, morbid obesity, OSA on CPAP .  He presented to [facility] ED 6/23 with SOB x 1 day.  He had missed HD on 6/18 so had a session 6/19 then [place] the beach.  Went for his scheduled session 6/23 and was found to have mild fever (100.4) along with worsening SOB; therefore, was sent to ED for further evaluation.   In ED, CXR prior to intubation demonstrated vascular congestion and possible basilar infiltrate.  He was in extremis and failed BiPAP due to vomiting.  He required emergent intubation due to desaturations as low as 50's.  No recent fevers/chills/sweats, chest pain, myalgias, exposures to known sick contacts.  COVID negative.   Past Medical History  OSA on CPAP, PAF, HTN, combined CHF (echo from 2019 with EF 55-60%, G2DD), ESRD on HD s/p renal transplant 2014, DM, hypothyroidism.  Significant Hospital Events   6/23 > admit. 6/24> worsening shock, CRRT initiated 6/25> shock improving however troponins continue to climb, LFTs remain elevated. Continues on CRRt. CXr with some improvement  6/26 >>Off Epi gtt and continuing to wean NE. Vaso remains at consistent dose   Consults:  Nephrology. Cardiology Vascular Surgery   Procedures:  ETT 6/23 >  L [place] CVC 6/23>    R fem trialysis catheter 6/24>  Significant Diagnostic Tests:  ECHO 6/24> LVEF 55-60%, RV D-shaped interventricular septum suggests pressure/volume overload. 6/25 RUQ [place] >> neg  Micro Data:  Blood 6/23 > no growth  Sputum 6/23 > no organisms seen  COVID 6/23 > negative.  Antimicrobials:  Vanc 6/23 > 6/26 Cefepime 6/23 >   Interim history/subjective:   Remains critically ill, off Levophed, remains on vasopressin Remains on PEEP of 14, FiO2 50% Deeply sedated Episode this morning where mucous plugs were suctioned out   Objective:  Blood pressure (!) 97/48, pulse 70, temperature (!) 96.4 F (35.8 C), temperature source Axillary, resp. rate (!) 35, height 6\' 2"  (1.88 m), weight (!) 147 kg, SpO2 100 %.    Vent Mode: PRVC FiO2 (%):  [50 %-60 %] 50 % Set Rate:  [35 bmp] 35 bmp Vt Set:  [560 mL] 560 mL PEEP:  [14 cmH20] 14 cmH20 Plateau Pressure:  [16 cmH20-27 cmH20] 16 cmH20   Intake/Output Summary (Last 24 hours) at 12/03/2018 0539 Last data filed at 12/03/2018 0800 Gross per 24 hour  Intake 3009.05 ml  Output 6250 ml  Net -3240.95 ml   Filed Weights   12/01/18 0444 12/02/18 0401 12/03/18 0500  Weight: (!) 148 kg (!) 150.3 kg (!) 147 kg    Examination:  General: Obese adult male, intubated, sedated,  on CRRT, supine in bed Neuro: Sedated , unresponsive HEENT: NCAT, ETT secure OGT secure, trachea midline,  pink mmm Cardiovascular: RRR s1s2 no rgm Lungs: Diminished bibasilar breath sounds, scattered bilateral rhonchi. Symmetrical chest expansion  Abdomen: Obese soft round, non-distended. Hypoactive x4  Musculoskeletal: Symmetrical bulk and tone. Anasarca appreciated. No cyanosis, no clubbing  Skin: clean dry cool, without rash.    Wrist x-ray 6/26 personally reviewed which shows much improved bilateral infiltrates  Assessment & Plan:   Acute hypoxic respiratory failure requiring intubation -pulmonary edema in setting of missed iHD + aspiration -Much improved P  -Lower PEEP gradually to 8 -Lower respiratory rate to 28 or lower to avoid auto PEEP Discontinue Versed drip , goal RA SS 0 to -1 -Obtain ABG on above settings -Start spontaneous breathing trials when more awake - Continue pulm hygiene   Shock, improving  -Favor septic shock due to aspiration - Tracheal aspirate 6/24 reveals no organisms  - Bcx 6/24 no growth  - SARS CoV2 negative  P - Continue cefepime.  Discontinue vancomycin - MAP goal >65 - Wean pressors as able for MAP goal -Off Levophed, can discontinue vasopressin soon  Transaminitis -likely shock liver in setting of above, possible contributing factor acute RV dysfunction causing hepatic congestion  P -Continue to trend LFTs  -Increase tube feeds to 30/hour  Elevated troponin, likely demand ischemia -troponin > 3000 on 6/24, 7724 on 6/25   P - cardiology following -DC heparin  RV dysfunction -likely related to ARDS but presumed pre-existing PH -revealed on ECHO 6/24, suggestive of volume overload  P Will need repeat echo after acute issues resolved   ESRD on OP HD following failed renal transplant  - iHD initially inpatient however given shock was transitioned to CRRT  P - Nephrology following appreciate CRRT management, aiming for -150/hour -Can transition to intermittent dialysis soon  Hx Hypertension. - holding antihypertensives in setting of shock   Anemia of chronic disease. - Transfuse for Hgb < 7.  DM P -SSI    Constipation -in setting of opioids, decreased mobility, critical illness P  PRN suppository    Summary-prognosis improving  Best Practice:  Diet: NPO  Pain/Anxiety/Delirium protocol (if indicated): Fent gtt midaz gtt  VAP protocol (if indicated): In place. DVT prophylaxis: Heparin gtt  GI prophylaxis: Pepcid  Glucose control: SSI Mobility: Bedrest. Code Status: Full. Family Communication: 6/25  Disposition: ICU.   The patient is critically ill with multiple organ  systems failure and requires high complexity decision making for assessment and support, frequent evaluation and titration of therapies, application of advanced monitoring technologies and extensive interpretation of multiple databases. Critical Care Time devoted to patient care services described in this note independent of APP/resident  time is 35 minutes.    Kara Mead MD. Sh[place] Flood. [facility] Pager [phone] If no response call [phone]    12/03/2018, 8:32 AM

## 2018-12-03 NOTE — Progress Notes (Signed)
[facility] Progress Note  Subjective: 3 L off overnight, CXR clearing up very well  Vitals:   12/03/18 0815 12/03/18 0830 12/03/18 0845 12/03/18 0900  BP: (!) 117/57 (!) 120/57 (!) 120/50   Pulse:      Resp: (!) 28 (!) 30 (!) 28 (!) 28  Temp:      TempSrc:      SpO2:      Weight:      Height:        Inpatient medications: . B-complex with vitamin C  1 tablet Per Tube Daily  . chlorhexidine gluconate (MEDLINE KIT)  15 mL Mouth Rinse BID  . Chlorhexidine Gluconate Cloth  6 each Topical Q0600  . Chlorhexidine Gluconate Cloth  6 each Topical Daily  . feeding supplement (PRO-STAT SUGAR FREE 64)  60 mL Per Tube TID  . heparin injection (subcutaneous)  5,000 Units Subcutaneous Q8H  . insulin aspart  0-9 Units Subcutaneous Q4H  . mouth rinse  15 mL Mouth Rinse 10 times per day  . promethazine  12.5 mg Intravenous Once  . senna-docusate  1 tablet Per Tube Daily  . sucroferric oxyhydroxide  1,000 mg Oral TID WC   .  prismasol BGK 4/2.5 400 mL/hr at 12/03/18 0620  .  prismasol BGK 4/2.5 200 mL/hr at 12/02/18 1726  . sodium chloride Stopped (12/03/18 0856)  . ceFEPime (MAXIPIME) IV Stopped (12/03/18 0430)  . famotidine (PEPCID) IV 100 mL/hr at 12/03/18 0900  . feeding supplement (VITAL AF 1.2 CAL) 30 mL/hr at 12/03/18 0902  . fentaNYL infusion INTRAVENOUS 100 mcg/hr (12/03/18 0900)  . norepinephrine (LEVOPHED) Adult infusion Stopped (12/03/18 0417)  . prismasol BGK 4/2.5 2,000 mL/hr at 12/03/18 0850  . vasopressin (PITRESSIN) infusion - *FOR SHOCK* Stopped (12/03/18 0854)   acetaminophen **OR** acetaminophen, albuterol, alteplase, bisacodyl, fentaNYL, heparin, midazolam, ondansetron **OR** ondansetron (ZOFRAN) IV, sodium chloride   Exam: Intubated on vent  No jvd  Chest clear ant/ lat  Cor tachy no RG  ABd obese dec'd BS  ext 1+ LE edema  Neuro on vent, sedated   Outpt HD: TTS [place]  4.5h  500/1.5  146kg   2/2.25 bath  Hep 11400   L thigh AVG - parsabiv   - mircera 150 q 2wks - hectorol 4 ug    Assessment/ Plan: 1. SOB/ hypoxemia/ acute resp failure: pulm edema + aspiration per CCM. Improving CXR, much better 2. Shock - pressors low dose 3. ESRD on HD TTS. Close to dry wt, will plan DC CRRT when clots or later today  4. H/o failed renal Tx 5. Morbid obesity 6. H/o HTN 7. DM2   [facility] 12/03/2018, 9:50 AM  Iron/TIBC/Ferritin/ %Sat    Component Value Date/Time   IRON 46 05/26/2018   TIBC 202 05/26/2018   FERRITIN 1,155 05/26/2018   IRONPCTSAT 59 (H) 05/11/2018 0235   Recent Labs  Lab 11/30/18 1710  12/03/18 0404  NA 139   < > 138  K 4.6   < > 4.5  CL 100   < > 102  CO2 21*   < > 22  GLUCOSE 111*   < > 160*  BUN 74*   < > 51*  CREATININE 17.47*   < > 6.58*  CALCIUM 8.3*   < > 8.4*  PHOS 8.2*   < > 4.0  4.2  ALBUMIN 2.8*   < > 2.6*  INR 1.5*  --   --    < > =  values in this interval not displayed.   Recent Labs  Lab 12/02/18 0404  AST 7,556*  ALT 3,595*  ALKPHOS 81  BILITOT 1.7*  PROT 7.0   Recent Labs  Lab 12/03/18 0404  WBC 9.5  HGB 7.7*  HCT 25.1*  PLT 196

## 2018-12-03 NOTE — Progress Notes (Signed)
Progress Note  Patient Name: [name] Date of Encounter: 12/03/2018  Primary Cardiologist:   No primary care provider on file.   Subjective    Intubated and sedated.   Inpatient Medications    Scheduled Meds: . B-complex with vitamin C  1 tablet Per Tube Daily  . chlorhexidine gluconate (MEDLINE KIT)  15 mL Mouth Rinse BID  . Chlorhexidine Gluconate Cloth  6 each Topical Q0600  . Chlorhexidine Gluconate Cloth  6 each Topical Daily  . feeding supplement (PRO-STAT SUGAR FREE 64)  60 mL Per Tube TID  . heparin injection (subcutaneous)  5,000 Units Subcutaneous Q8H  . hydrocortisone sod succinate (SOLU-CORTEF) inj  100 mg Intravenous Q8H  . insulin aspart  0-9 Units Subcutaneous Q4H  . mouth rinse  15 mL Mouth Rinse 10 times per day  . promethazine  12.5 mg Intravenous Once  . senna-docusate  1 tablet Per Tube Daily  . sucroferric oxyhydroxide  1,000 mg Oral TID WC   Continuous Infusions: .  prismasol BGK 4/2.5 400 mL/hr at 12/03/18 0620  .  prismasol BGK 4/2.5 200 mL/hr at 12/02/18 1726  . sodium chloride 5 mL/hr at 12/03/18 0700  . ceFEPime (MAXIPIME) IV Stopped (12/03/18 0430)  . famotidine (PEPCID) IV Stopped (12/02/18 1941)  . feeding supplement (VITAL AF 1.2 CAL) 1,000 mL (12/02/18 1805)  . fentaNYL infusion INTRAVENOUS 100 mcg/hr (12/03/18 0700)  . midazolam 2 mg/hr (12/03/18 0700)  . norepinephrine (LEVOPHED) Adult infusion Stopped (12/03/18 0417)  . prismasol BGK 4/2.5 2,000 mL/hr at 12/03/18 0620  .  sodium bicarbonate  infusion 1000 mL Stopped (12/02/18 0953)  . vancomycin Stopped (12/02/18 1919)  . vasopressin (PITRESSIN) infusion - *FOR SHOCK* 0.03 Units/min (12/03/18 0700)   PRN Meds: acetaminophen **OR** acetaminophen, albuterol, alteplase, bisacodyl, fentaNYL, heparin, midazolam, ondansetron **OR** ondansetron (ZOFRAN) IV, sodium chloride   Vital Signs    Vitals:   12/03/18 0602 12/03/18 0700 12/03/18 0715 12/03/18 0730  BP: 125/66  (!) 119/95    Pulse:   62   Resp: (!) 23 (!) 35 (!) 35 (!) 22  Temp:  (!) 96.4 F (35.8 C)    TempSrc:  Axillary    SpO2:   98% 99%  Weight:      Height:        Intake/Output Summary (Last 24 hours) at 12/03/2018 0734 Last data filed at 12/03/2018 0700 Gross per 24 hour  Intake 3125.31 ml  Output 6350 ml  Net -3224.69 ml   Filed Weights   12/01/18 0444 12/02/18 0401 12/03/18 0500  Weight: (!) 148 kg (!) 150.3 kg (!) 147 kg    Telemetry    NSR, sinus tach with first degree AV block, PVCs - Personally Reviewed  ECG    NA - Personally Reviewed  Physical Exam   GEN:    Critically ill appearing Neck:   Unable to assess JVD Cardiac: RRR, no murmurs, rubs, or gallops.  Respiratory:    Decreased breath sounds GI: Soft, nontender, non-distended, decreased bowel sounds MS:  Mild diffuse edema; No deformity. Neuro:   Nonfocal  Psych:   Intubated and sedated.    Labs    Chemistry Recent Labs  Lab 11/30/18 1710  12/01/18 1250  12/02/18 0404 12/02/18 7408 12/02/18 0754 12/02/18 1500 12/03/18 0404  NA 139   < >  --    < > 138 136  --  138 138  K 4.6   < > 6.4*   < > 4.9 4.6 4.5  4.5 4.5  CL 100   < >  --    < > 97*  --   --  97* 102  CO2 21*   < >  --    < > 20*  --   --  23 22  GLUCOSE 111*   < >  --    < > 168*  --   --  140* 160*  BUN 74*   < >  --    < > 50*  --   --  48* 51*  CREATININE 17.47*   < >  --    < > 9.50*  --   --  7.93* 6.58*  CALCIUM 8.3*   < >  --    < > 7.5*  --   --  8.0* 8.4*  PROT 6.9  --  6.7  --  7.0  --   --   --   --   ALBUMIN 2.8*   < > 2.7*   < > 2.8*  2.8*  --   --  2.7* 2.6*  AST 12*  --  7,277*  --  7,556*  --   --   --   --   ALT 12  --  2,757*  --  3,595*  --   --   --   --   ALKPHOS 42  --  57  --  81  --   --   --   --   BILITOT 1.1  --  1.7*  --  1.7*  --   --   --   --   GFRNONAA 3*   < >  --    < > 6*  --   --  7* 9*  GFRAA 3*   < >  --    < > 7*  --   --  9* 11*  ANIONGAP 18*   < >  --    < > 21*  --   --  18* 14   < > = values in  this interval not displayed.     Hematology Recent Labs  Lab 12/01/18 0636  12/02/18 0404 12/02/18 0652 12/03/18 0404  WBC 12.2*  --  10.6*  --  9.5  RBC 3.18*  --  3.30*  --  2.87*  HGB 8.6*   < > 8.9* 9.2* 7.7*  HCT 29.8*   < > 29.6* 27.0* 25.1*  MCV 93.7  --  89.7  --  87.5  MCH 27.0  --  27.0  --  26.8  MCHC 28.9*  --  30.1  --  30.7  RDW 16.6*  --  16.7*  --  16.6*  PLT 343  --  258  --  196   < > = values in this interval not displayed.    Cardiac EnzymesNo results for input(s): TROPONINI in the last 168 hours. No results for input(s): TROPIPOC in the last 168 hours.   BNPNo results for input(s): BNP, PROBNP in the last 168 hours.   DDimer No results for input(s): DDIMER in the last 168 hours.   Radiology    Dg Chest Port 1 View  Result Date: 12/02/2018 CLINICAL DATA:  Respiratory failure. EXAM: PORTABLE CHEST 1 VIEW COMPARISON:  Radiograph of December 01, 2018. FINDINGS: Stable cardiomediastinal silhouette. Endotracheal and nasogastric tubes are unchanged in position. Slightly improved bilateral lung opacities are noted suggesting improving pneumonia. Dominant opacity remains in right lung base. Stable position of left  subclavian catheter which is directed into left internal jugular vein. Bony thorax is unremarkable. No pneumothorax or pleural effusion is noted. IMPRESSION: Mildly improved bilateral lung opacities are noted suggesting improving pneumonia. Electronically Signed   By: Marijo Conception M.D.   On: 12/02/2018 07:47   US Abdomen Limited Ruq  Result Date: 12/02/2018 CLINICAL DATA:  Elevated LFTs. EXAM: ULTRASOUND ABDOMEN LIMITED RIGHT UPPER QUADRANT COMPARISON:  None. FINDINGS: Gallbladder: No gallstones or gallbladder wall thickening. No pericholecystic fluid. Common bile duct: Diameter: 5 mm Liver: Liver appears echogenic suggesting fatty deposition. Portal vein is patent on color Doppler imaging with normal direction of blood flow towards the liver. Other: Trace  fluid noted adjacent to the liver. IMPRESSION: Patient is on a ventilator which limits assessment due to inability to reposition as normal part of the exam. Within this limitation, gallbladder is unremarkable and there is no evidence for biliary dilatation. Increased echogenicity of liver parenchyma suggests fatty deposition. Trace fluid adjacent to the liver. Electronically Signed   By: Misty Stanley M.D.   On: 12/02/2018 19:31    Cardiac Studies   ECHO:   1. The left ventricle has normal systolic function, with an ejection fraction of 55-60%. The cavity size was normal. There is moderately increased left ventricular wall thickness. Left ventricular diastolic Doppler parameters are consistent with  impaired relaxation. No evidence of left ventricular regional wall motion abnormalities. D-shaped interventricular septum suggestive of RV pressure/volume overload.  2. Right atrial size was mildly dilated.  3. Trivial pericardial effusion is present.  4. The aortic valve is tricuspid. Mild calcification of the aortic valve. No stenosis of the aortic valve.  5. The aortic root is normal in size and structure.  6. There is mild dilatation of the ascending aorta measuring 42 mm.  7. The right ventricle has severely reduced systolic function. The cavity was moderately enlarged. There is no increase in right ventricular wall thickness.  8. The inferior vena cava was dilated in size with <50% respiratory variability. PA systolic pressure 45 mmHg.  9. No evidence of mitral valve stenosis. No significant mitral regurgitation.  Patient Profile     43 y.o. male with a hx of chronic diastolic HF, ESRD s/p renal transplant in 2014 at [place] that lasted until 2019 and placed back on HD, T2DM, HTN, morbid obesity and OSA who is being seen for the evaluation of elevated troponin at the request of Dr. Nelda Marseille, Pulmonary Critical Care Medicine.   Assessment & Plan    Elevated Troponin:   Demand ischemia.   hsTrop  treniding down.  Eventually might need non invasive evaluation.  LV EF OK as above.   Suggest ASA per tube.    HYPERKALEMIA:   Corrected  ELEVATED LIVER ENZYMES:  Shock liver.  Possibly some component of passive congestion with RV dysfunction.  Follow enzymes per CCM team.   Abdominal US with fatty liver but no gallbladder abnormalities.     Acute Hypoxic Respiratory Failure:      Vent support and management for possible pneumonia per CCM.      ESRD:    Per nephrology.    For questions or updates, please contact [place] Please consult [email or web address] for contact info under Cardiology/STEMI.   Signed, Minus Breeding, MD  12/03/2018, 7:34 AM

## 2018-12-04 ENCOUNTER — Inpatient Hospital Stay (HOSPITAL_COMMUNITY): Payer: 59

## 2018-12-04 DIAGNOSIS — N186 End stage renal disease: Secondary | ICD-10-CM

## 2018-12-04 DIAGNOSIS — Z992 Dependence on renal dialysis: Secondary | ICD-10-CM

## 2018-12-04 DIAGNOSIS — J80 Acute respiratory distress syndrome: Secondary | ICD-10-CM

## 2018-12-04 LAB — COMPREHENSIVE METABOLIC PANEL
ALT: 2036 U/L — ABNORMAL HIGH (ref 0–44)
AST: 1217 U/L — ABNORMAL HIGH (ref 15–41)
Albumin: 2.5 g/dL — ABNORMAL LOW (ref 3.5–5.0)
Alkaline Phosphatase: 98 U/L (ref 38–126)
Anion gap: 16 — ABNORMAL HIGH (ref 5–15)
BUN: 95 mg/dL — ABNORMAL HIGH (ref 6–20)
CO2: 26 mmol/L (ref 22–32)
Calcium: 8.4 mg/dL — ABNORMAL LOW (ref 8.9–10.3)
Chloride: 99 mmol/L (ref 98–111)
Creatinine, Ser: 8.38 mg/dL — ABNORMAL HIGH (ref 0.61–1.24)
GFR calc Af Amer: 8 mL/min — ABNORMAL LOW (ref >60)
GFR calc non Af Amer: 7 mL/min — ABNORMAL LOW (ref >60)
Glucose, Bld: 133 mg/dL — ABNORMAL HIGH (ref 70–99)
Potassium: 4.1 mmol/L (ref 3.5–5.1)
Sodium: 141 mmol/L (ref 135–145)
Total Bilirubin: 1.1 mg/dL (ref 0.3–1.2)
Total Protein: 6.5 g/dL (ref 6.5–8.1)

## 2018-12-04 LAB — GLUCOSE, CAPILLARY
Glucose-Capillary: 102 mg/dL — ABNORMAL HIGH (ref 70–99)
Glucose-Capillary: 105 mg/dL — ABNORMAL HIGH (ref 70–99)
Glucose-Capillary: 117 mg/dL — ABNORMAL HIGH (ref 70–99)
Glucose-Capillary: 122 mg/dL — ABNORMAL HIGH (ref 70–99)
Glucose-Capillary: 135 mg/dL — ABNORMAL HIGH (ref 70–99)
Glucose-Capillary: 94 mg/dL (ref 70–99)

## 2018-12-04 LAB — CBC
HCT: 27.6 % — ABNORMAL LOW (ref 39.0–52.0)
Hemoglobin: 8.3 g/dL — ABNORMAL LOW (ref 13.0–17.0)
MCH: 26.9 pg (ref 26.0–34.0)
MCHC: 30.1 g/dL (ref 30.0–36.0)
MCV: 89.6 fL (ref 80.0–100.0)
Platelets: 193 10*3/uL (ref 150–400)
RBC: 3.08 MIL/uL — ABNORMAL LOW (ref 4.22–5.81)
RDW: 16.7 % — ABNORMAL HIGH (ref 11.5–15.5)
WBC: 15.5 10*3/uL — ABNORMAL HIGH (ref 4.0–10.5)
nRBC: 8.6 % — ABNORMAL HIGH (ref 0.0–0.2)

## 2018-12-04 MED ORDER — CHLORHEXIDINE GLUCONATE CLOTH 2 % EX PADS
6.0000 | MEDICATED_PAD | Freq: Every day | CUTANEOUS | Status: DC
  Administered 2018-12-05 – 2018-12-06 (×2): 6 via TOPICAL

## 2018-12-04 MED ORDER — VITAL AF 1.2 CAL PO LIQD
1000.0000 mL | ORAL | Status: DC
  Administered 2018-12-04 – 2018-12-06 (×2): 1000 mL

## 2018-12-04 MED ORDER — FENTANYL CITRATE (PF) 100 MCG/2ML IJ SOLN
25.0000 ug | INTRAMUSCULAR | Status: DC | PRN
  Administered 2018-12-04 – 2018-12-06 (×10): 100 ug via INTRAVENOUS
  Filled 2018-12-04 (×11): qty 2

## 2018-12-04 NOTE — Progress Notes (Signed)
[facility] Progress Note  Subjective: remains on vent, Cr 8.3 and BN 95 today, I/O +880 yest  Vitals:   12/04/18 0500 12/04/18 0719 12/04/18 0750 12/04/18 0800  BP: 138/76  (!) 82/64 134/70  Pulse: 86  90 89  Resp: (!) 27  (!) 31 12  Temp:  99.3 F (37.4 C)    TempSrc:  Axillary    SpO2: 100%  100% 99%  Weight: (!) 145 kg     Height:        Inpatient medications: . B-complex with vitamin C  1 tablet Per Tube Daily  . chlorhexidine gluconate (MEDLINE KIT)  15 mL Mouth Rinse BID  . Chlorhexidine Gluconate Cloth  6 each Topical Q0600  . Chlorhexidine Gluconate Cloth  6 each Topical Daily  . feeding supplement (PRO-STAT SUGAR FREE 64)  60 mL Per Tube TID  . heparin injection (subcutaneous)  5,000 Units Subcutaneous Q8H  . insulin aspart  0-9 Units Subcutaneous Q4H  . mouth rinse  15 mL Mouth Rinse 10 times per day  . midodrine  10 mg Oral TID WC  . promethazine  12.5 mg Intravenous Once  . senna-docusate  1 tablet Per Tube Daily  . sucroferric oxyhydroxide  1,000 mg Oral TID WC   . sodium chloride Stopped (12/03/18 1906)  . sodium chloride 10 mL/hr at 12/04/18 0600  . ceFEPime (MAXIPIME) IV    . famotidine (PEPCID) IV Stopped (12/03/18 0926)  . feeding supplement (VITAL AF 1.2 CAL)    . norepinephrine (LEVOPHED) Adult infusion 4 mcg/min (12/04/18 0600)   sodium chloride, acetaminophen **OR** acetaminophen, albuterol, bisacodyl, fentaNYL (SUBLIMAZE) injection, midazolam, ondansetron **OR** ondansetron (ZOFRAN) IV   Exam: Intubated on vent  No jvd  Chest clear ant/ lat  Cor tachy no RG  ABd obese dec'd BS  ext 1+ LE edema  Neuro on vent, sedated   Outpt HD: TTS [place]  4.5h  500/1.5  146kg   2/2.25 bath  Hep 11400   L thigh AVG - parsabiv  - mircera 150 q 2wks - hectorol 4 ug    Assessment/ Plan: 1. SOB/ hypoxemia/ acute resp failure: pulm edema + aspiration per CCM. Improving CXR, much better 2. Shock - better 3. ESRD on HD TTS. Under  dry, plan HD later today in ICU 4. H/o failed renal Tx 5. Morbid obesity 6. H/o HTN 7. DM2   [facility] 12/04/2018, 9:20 AM  Iron/TIBC/Ferritin/ %Sat    Component Value Date/Time   IRON 46 05/26/2018   TIBC 202 05/26/2018   FERRITIN 1,155 05/26/2018   IRONPCTSAT 59 (H) 05/11/2018 0235   Recent Labs  Lab 11/30/18 1710  12/03/18 0404  12/04/18 0639  NA 139   < > 138   < > 141  K 4.6   < > 4.5   < > 4.1  CL 100   < > 102  --  99  CO2 21*   < > 22  --  26  GLUCOSE 111*   < > 160*  --  133*  BUN 74*   < > 51*  --  95*  CREATININE 17.47*   < > 6.58*  --  8.38*  CALCIUM 8.3*   < > 8.4*  --  8.4*  PHOS 8.2*   < > 4.0  4.2  --   --   ALBUMIN 2.8*   < > 2.6*  --  2.5*  INR 1.5*  --   --   --   --    < > =  values in this interval not displayed.   Recent Labs  Lab 12/04/18 0639  AST 1,217*  ALT 2,036*  ALKPHOS 98  BILITOT 1.1  PROT 6.5   Recent Labs  Lab 12/04/18 0639  WBC 15.5*  HGB 8.3*  HCT 27.6*  PLT 193

## 2018-12-04 NOTE — Progress Notes (Signed)
NAME:  [name], MRN:  [id number], DOB:  [birth year], LOS: 4 ADMISSION DATE:  11/30/2018, CONSULTATION DATE:  11/30/18 REFERRING MD:  Wilson Singer  CHIEF COMPLAINT:  SOB   Brief History   [name] is a 43 y.o. male who presented to [facility] ED 6/23 with respiratory distress and hypoxic respiratory failure presumed due to acute pulmonary edema after missing HD. Also possible HCAP. Required intubation in ED due to extremis. Developed ARDS and florid septic shock requiring epinephrine drip and multiorgan failure    Past Medical History  OSA on CPAP, PAF, HTN, combined CHF (echo from 2019 with EF 55-60%, G2DD), ESRD on HD s/p renal transplant 2014, DM, hypothyroidism.  Significant Hospital Events   6/23 > admit. 6/24> worsening shock, CRRT initiated 6/25> shock improving however troponins continue to climb, LFTs remain elevated. Continues on CRRt. CXr with some improvement  6/26 >>Off Epi gtt and continuing to wean NE. Vaso remains at consistent dose   Consults:  Nephrology. Cardiology Vascular Surgery   Procedures:  ETT 6/23 >  L [place] CVC 6/23>   R fem trialysis catheter 6/24>  Significant Diagnostic Tests:  ECHO 6/24> LVEF 55-60%, RV D-shaped interventricular septum suggests pressure/volume overload. 6/25 RUQ [place] >> neg  Micro Data:  Blood 6/23 > no growth  Sputum 6/23 > no organisms seen  COVID 6/23 > negative.  Antimicrobials:  Vanc 6/23 > 6/26 Cefepime 6/23 >   Interim history/subjective:   He is critically ill, intubated, on low-dose Levophed drip PEEP down to 5, FiO2 at 50% Afebrile   Objective:  Blood pressure 134/70, pulse 89, temperature 99.3 F (37.4 C), temperature source Axillary, resp. rate 12, height 6\' 2"  (1.88 m), weight (!) 145 kg, SpO2 99 %.    Vent Mode: PRVC FiO2 (%):  [40 %-50 %] 50 % Set Rate:  [25 bmp-28 bmp] 25 bmp Vt Set:  [560 mL] 560 mL PEEP:  [5 cmH20-10 cmH20] 5 cmH20 Plateau Pressure:  [19 cmH20-21 cmH20] 21 cmH20    Intake/Output Summary (Last 24 hours) at 12/04/2018 0836 Last data filed at 12/04/2018 0600 Gross per 24 hour  Intake 1600.99 ml  Output 729 ml  Net 871.99 ml   Filed Weights   12/02/18 0401 12/03/18 0500 12/04/18 0500  Weight: (!) 150.3 kg (!) 147 kg (!) 145 kg    Examination:  General: Obese adult male, intubated, sedated,  on CRRT, supine in bed Neuro: Sedated , unresponsive-flickers eyes, spontaneous movement of right upper and lower extremity, does not follow commands HEENT: NCAT, ETT secure OGT secure, trachea midline, pink mmm Cardiovascular: RRR s1s2 no rgm Lungs: Diminished bibasilar breath sounds, scattered bilateral rhonchi. Symmetrical chest expansion  Abdomen: Obese soft round, non-distended. Hypoactive x4  Musculoskeletal: Symmetrical bulk and tone. Anasarca appreciated. No cyanosis, no clubbing  Skin: clean dry cool, without rash.    Chest x-ray 6/27 shows market improvement in bilateral infiltrates with mild bibasal persisting, left subclavian axis going into IJ  Assessment & Plan:   ARDS -pulmonary edema in setting of missed iHD + aspiration -Much improved P -Lower PEEP to 5 -Lower respiratory rate to 20 , start spontaneous breathing trials  Acute encephalopathy-likely due to residual sedation Discontinued Versed drip 6/26 , goal RA SS 0 to -1 Can use Precedex if agitation   Shock, improving  -Favor septic shock due to aspiration - Tracheal aspirate 6/24 reveals no organisms  - Bcx 6/24 no growth  - SARS CoV2 negative  P - Continue cefepime.  Plan 7 to 10 days - MAP goal >65, okay to use low-dose Levophed  Transaminitis -likely shock liver in setting of above, possible contributing factor acute RV dysfunction causing hepatic congestion -improved P -Continue to trend LFTs  -Increase tube feeds to 40/hour  Elevated troponin, likely demand ischemia -troponin > 3000 on 6/24, 7724 on 6/25   P - cardiology following -DC heparin  RV dysfunction  -likely related to ARDS but presumed pre-existing PH -revealed on ECHO 6/24, suggestive of volume overload  P Will need repeat echo after acute issues resolved   ESRD on OP HD following failed renal transplant  - iHD initially inpatient however given shock was transitioned to CRRT  P - Nephrology following, off CRRT  -Can transition to intermittent dialysis  Hx Hypertension. - holding antihypertensives in setting of shock   Anemia of chronic disease. - Transfuse for Hgb < 7.  DM P -SSI    Constipation -in setting of opioids, decreased mobility, critical illness P  PRN suppository    Summary-prognosis improving, hoping to start spontaneous breathing trials and extubate once he wakes up fully  Best Practice:  Diet: NPO , TFs Pain/Anxiety/Delirium protocol (if indicated): Fent gtt midaz gtt  VAP protocol (if indicated): In place. DVT prophylaxis: Heparin sq GI prophylaxis: Pepcid  Glucose control: SSI Mobility: Bedrest. Code Status: Full. Family Communication: 6/25  Disposition: ICU.  The patient is critically ill with multiple organ systems failure and requires high complexity decision making for assessment and support, frequent evaluation and titration of therapies, application of advanced monitoring technologies and extensive interpretation of multiple databases. Critical Care Time devoted to patient care services described in this note independent of APP/resident  time is 35 minutes.     Kara Mead MD. Shade Flood. [place] [facility] Pager [phone] If no response call [phone]    12/04/2018, 8:36 AM

## 2018-12-04 NOTE — Progress Notes (Signed)
Alerted floor RN Johnnye Sima patient hemodialysis treatment will be on 12/05/18.

## 2018-12-05 ENCOUNTER — Inpatient Hospital Stay (HOSPITAL_COMMUNITY): Payer: 59

## 2018-12-05 LAB — CULTURE, BLOOD (ROUTINE X 2)
Culture: NO GROWTH
Culture: NO GROWTH
Special Requests: ADEQUATE

## 2018-12-05 LAB — CBC
HCT: 25.3 % — ABNORMAL LOW (ref 39.0–52.0)
Hemoglobin: 7.7 g/dL — ABNORMAL LOW (ref 13.0–17.0)
MCH: 27.2 pg (ref 26.0–34.0)
MCHC: 30.4 g/dL (ref 30.0–36.0)
MCV: 89.4 fL (ref 80.0–100.0)
Platelets: 148 10*3/uL — ABNORMAL LOW (ref 150–400)
RBC: 2.83 MIL/uL — ABNORMAL LOW (ref 4.22–5.81)
RDW: 17 % — ABNORMAL HIGH (ref 11.5–15.5)
WBC: 16 10*3/uL — ABNORMAL HIGH (ref 4.0–10.5)
nRBC: 3.4 % — ABNORMAL HIGH (ref 0.0–0.2)

## 2018-12-05 LAB — BASIC METABOLIC PANEL
Anion gap: 13 (ref 5–15)
BUN: 137 mg/dL — ABNORMAL HIGH (ref 6–20)
CO2: 23 mmol/L (ref 22–32)
Calcium: 8.6 mg/dL — ABNORMAL LOW (ref 8.9–10.3)
Chloride: 103 mmol/L (ref 98–111)
Creatinine, Ser: 10.59 mg/dL — ABNORMAL HIGH (ref 0.61–1.24)
GFR calc Af Amer: 6 mL/min — ABNORMAL LOW (ref >60)
GFR calc non Af Amer: 5 mL/min — ABNORMAL LOW (ref >60)
Glucose, Bld: 116 mg/dL — ABNORMAL HIGH (ref 70–99)
Potassium: 4.1 mmol/L (ref 3.5–5.1)
Sodium: 139 mmol/L (ref 135–145)

## 2018-12-05 LAB — GLUCOSE, CAPILLARY
Glucose-Capillary: 103 mg/dL — ABNORMAL HIGH (ref 70–99)
Glucose-Capillary: 106 mg/dL — ABNORMAL HIGH (ref 70–99)
Glucose-Capillary: 108 mg/dL — ABNORMAL HIGH (ref 70–99)
Glucose-Capillary: 121 mg/dL — ABNORMAL HIGH (ref 70–99)
Glucose-Capillary: 123 mg/dL — ABNORMAL HIGH (ref 70–99)
Glucose-Capillary: 127 mg/dL — ABNORMAL HIGH (ref 70–99)

## 2018-12-05 MED ORDER — FAMOTIDINE 40 MG/5ML PO SUSR
20.0000 mg | Freq: Every day | ORAL | Status: DC
  Administered 2018-12-05 – 2018-12-06 (×2): 20 mg
  Filled 2018-12-05 (×2): qty 2.5

## 2018-12-05 MED ORDER — HEPARIN SODIUM (PORCINE) 1000 UNIT/ML DIALYSIS
5000.0000 [IU] | INTRAMUSCULAR | Status: AC | PRN
  Administered 2018-12-05 – 2018-12-07 (×2): 5000 [IU] via INTRAVENOUS_CENTRAL
  Filled 2018-12-05 (×4): qty 5

## 2018-12-05 MED ORDER — MIDAZOLAM HCL 2 MG/2ML IJ SOLN
1.0000 mg | INTRAMUSCULAR | Status: DC | PRN
  Administered 2018-12-05: 01:00:00 1 mg via INTRAVENOUS
  Filled 2018-12-05 (×2): qty 2

## 2018-12-05 MED ORDER — DEXMEDETOMIDINE HCL IN NACL 400 MCG/100ML IV SOLN
0.4000 ug/kg/h | INTRAVENOUS | Status: DC
  Administered 2018-12-05 (×2): 1 ug/kg/h via INTRAVENOUS
  Administered 2018-12-05: 15:00:00 0.4 ug/kg/h via INTRAVENOUS
  Administered 2018-12-05 (×2): 0.8 ug/kg/h via INTRAVENOUS
  Administered 2018-12-05: 08:00:00 0.4 ug/kg/h via INTRAVENOUS
  Administered 2018-12-06 (×4): 1.2 ug/kg/h via INTRAVENOUS
  Administered 2018-12-06: 04:00:00 1 ug/kg/h via INTRAVENOUS
  Administered 2018-12-06: 06:00:00 0.8 ug/kg/h via INTRAVENOUS
  Administered 2018-12-06: 17:00:00 1 ug/kg/h via INTRAVENOUS
  Administered 2018-12-06: 0.601 ug/kg/h via INTRAVENOUS
  Administered 2018-12-07 (×2): 1.2 ug/kg/h via INTRAVENOUS
  Administered 2018-12-07: 07:00:00 0.8 ug/kg/h via INTRAVENOUS
  Filled 2018-12-05 (×11): qty 100
  Filled 2018-12-05: qty 200
  Filled 2018-12-05 (×4): qty 100

## 2018-12-05 NOTE — Progress Notes (Signed)
Notified ELink about ETT cuff blown. Deterding, MD said she will page ground team to come to bedside. Patient is stable. O2 sats 100%. Will continue to monitor.

## 2018-12-05 NOTE — Progress Notes (Signed)
NAME:  [name], MRN:  [id number], DOB:  1975-11-30, LOS: 5 ADMISSION DATE:  11/30/2018, CONSULTATION DATE:  11/30/18 REFERRING MD:  Wilson Singer  CHIEF COMPLAINT:  SOB   Brief History   [name] is a 43 y.o. male who presented to [facility] ED 6/23 with respiratory distress and hypoxic respiratory failure presumed due to acute pulmonary edema after missing HD. Also possible HCAP. Required intubation in ED due to extremis. Developed ARDS and florid septic shock requiring epinephrine drip and multiorgan failure    Past Medical History  OSA on CPAP, PAF, HTN, combined CHF (echo from 2019 with EF 55-60%, G2DD), ESRD on HD s/p renal transplant 2014, DM, hypothyroidism.  Significant Hospital Events   6/23 > admit. 6/24> worsening shock, CRRT initiated 6/25> shock improving however troponins continue to climb, LFTs remain elevated. Continues on CRRt. CXr with some improvement  6/26 >>Off Epi gtt and continuing to wean NE. Vaso remains at consistent dose   Consults:  Nephrology. Cardiology Vascular Surgery   Procedures:  ETT 6/23 >  L [place] CVC 6/23>   R fem trialysis catheter 6/24>  Significant Diagnostic Tests:  ECHO 6/24> LVEF 55-60%, RV D-shaped interventricular septum suggests pressure/volume overload. 6/25 RUQ [place] >> neg  Micro Data:  Blood 6/23 > no growth  Sputum 6/23 > no organisms seen  COVID 6/23 > negative.  Antimicrobials:  Vanc 6/23 > 6/26 Cefepime 6/23 >   Interim history/subjective:   He is critically ill, intubated Now off Levophed drip Concerns for cuff leak overnight, changed to SIMV pressure settings Afebrile Examined on dialysis mild agitation   Objective:  Blood pressure 116/65, pulse 88, temperature 98.2 F (36.8 C), temperature source Axillary, resp. rate (!) 30, height 6\' 2"  (1.88 m), weight (!) 145 kg, SpO2 98 %.    Vent Mode: SIMV;PSV;PRVC FiO2 (%):  [40 %-50 %] 40 % Set Rate:  [12 bmp-20 bmp] 12 bmp Vt Set:  [560 mL] 560 mL PEEP:  [5  cmH20] 5 cmH20 Pressure Support:  [10 cmH20] 10 cmH20 Plateau Pressure:  [13 cmH20-21 cmH20] 13 cmH20   Intake/Output Summary (Last 24 hours) at 12/05/2018 4193 Last data filed at 12/05/2018 0745 Gross per 24 hour  Intake 959.57 ml  Output -  Net 959.57 ml   Filed Weights   12/03/18 0500 12/04/18 0500 12/05/18 0332  Weight: (!) 147 kg (!) 145 kg (!) 145 kg    Examination:  General: Obese adult male, intubated, supine in bed Neuro: Thrashing around, not directable, spontaneous movement of all extremities, does not follow commands HEENT: NCAT, ETT secure OGT secure, trachea midline, pink mmm Cardiovascular: RRR s1s2 no rgm Lungs: Diminished bibasilar breath sounds, scattered bilateral rhonchi. Symmetrical chest expansion  Abdomen: Obese soft round, non-distended. Hypoactive x4  Musculoskeletal: Symmetrical bulk and tone. Anasarca appreciated. No cyanosis, no clubbing  Skin: clean dry cool, without rash.    Chest x-ray 6/28 personally reviewed, ET tube in position, bibasilar infiltrate/atelectasis, left subclavian malposition as prior  Assessment & Plan:   ARDS -Acute pulmonary edema in setting of missed iHD + aspiration -Much improved P - ct spontaneous breathing trials , mental status limiting factor for extubation -Cuff leak 6/28, may have to change ET tube if not extubated   Acute encephalopathy-likely due to residual sedation  goal RA SS 0 to -1 -use Precedex gtt + intermittent fentanyl   Septic shock due to aspiration, improving - Tracheal aspirate 6/24 reveals no organisms  - Bcx 6/24 no growth  -  SARS CoV2 negative  P - Continue cefepime.  Plan until 6/29 - MAP goal >65, Off Levophed, DC midodrine  Transaminitis -likely shock liver in setting of above, possible contributing factor acute RV dysfunction causing hepatic congestion -improved P -Continue to trend LFTs  -Increase tube feeds to goal  Elevated troponin, likely demand ischemia -troponin > 3000 on  6/24, 7724 on 6/25   P - cardiology following -off heparin  RV dysfunction -likely related to ARDS but presumed pre-existing PH -revealed on ECHO 6/24, suggestive of volume overload  P Will need repeat echo after acute issues resolved   ESRD on OP HD following failed renal transplant  - iHD initially inpatient however given shock was transitioned to CRRT  P - Nephrology following, off CRRT  -Transitioned to intermittent dialysis, session today  Hx Hypertension. - holding antihypertensives in setting of shock   Anemia of chronic disease. - Transfuse for Hgb < 7.  DM P -SSI    Constipation -in setting of opioids, decreased mobility, critical illness P  PRN suppository    Summary-prognosis improving, hopeful that his mental status will turn around in the next 1 to days for [place] to extubate  Best Practice:  Diet: NPO , TFs Pain/Anxiety/Delirium protocol (if indicated): Fent gtt midaz gtt  VAP protocol (if indicated): In place. DVT prophylaxis: Heparin sq GI prophylaxis: Pepcid  Glucose control: SSI Mobility: Bedrest. Code Status: Full. Family Communication: 6/27 sister  Disposition: ICU.  The patient is critically ill with multiple organ systems failure and requires high complexity decision making for assessment and support, frequent evaluation and titration of therapies, application of advanced monitoring technologies and extensive interpretation of multiple databases. Critical Care Time devoted to patient care services described in this note independent of APP/resident  time is 35 minutes.      Kara Mead MD. Shade Flood. [place] [facility] Pager [phone] If no response call [phone]    12/05/2018, 8:21 AM

## 2018-12-05 NOTE — Progress Notes (Signed)
MD states that since patient is stable there is no need to extubate until in the morning if criteria is met. Patient is adequately sedated at this time w/o any hemodynamic compromise. Cuff at this time is taking excessive pressures in order to maintain a seal. Seal/cuff pressures will gradually began to decrease over time. RN aware and MD Carson Myrtle was made aware. RRT will continue to monitor patient clinical status.

## 2018-12-05 NOTE — Progress Notes (Signed)
[facility] Progress Note  Subjective: remains on vent, on HD this am, I/O+ 900 yest, pt agitated but not following commands  Vitals:   12/05/18 0800 12/05/18 0815 12/05/18 0830 12/05/18 0845  BP:  (!) 155/81 (!) 133/58 108/67  Pulse: 93 (!) 101 (!) 103 74  Resp: 13 (!) 23 19 (!) 30  Temp:      TempSrc:      SpO2: 98% 95% 98% (P) 100%  Weight:      Height:        Inpatient medications: . B-complex with vitamin C  1 tablet Per Tube Daily  . chlorhexidine gluconate (MEDLINE KIT)  15 mL Mouth Rinse BID  . Chlorhexidine Gluconate Cloth  6 each Topical Q0600  . famotidine  20 mg Per Tube Daily  . feeding supplement (PRO-STAT SUGAR FREE 64)  60 mL Per Tube TID  . heparin injection (subcutaneous)  5,000 Units Subcutaneous Q8H  . insulin aspart  0-9 Units Subcutaneous Q4H  . mouth rinse  15 mL Mouth Rinse 10 times per day  . promethazine  12.5 mg Intravenous Once  . senna-docusate  1 tablet Per Tube Daily  . sucroferric oxyhydroxide  1,000 mg Oral TID WC   . sodium chloride Stopped (12/05/18 0032)  . sodium chloride Stopped (12/04/18 0930)  . ceFEPime (MAXIPIME) IV Stopped (12/04/18 2247)  . dexmedetomidine (PRECEDEX) IV infusion 0.4 mcg/kg/hr (12/05/18 0825)  . feeding supplement (VITAL AF 1.2 CAL) 1,000 mL (12/04/18 2217)   sodium chloride, acetaminophen **OR** acetaminophen, albuterol, bisacodyl, fentaNYL (SUBLIMAZE) injection, heparin, ondansetron **OR** ondansetron (ZOFRAN) IV   Exam: Intubated on vent  No jvd  Chest clear ant/ lat  Cor tachy no RG  ABd obese dec'd BS  ext no LE edema  Neuro on vent, sedated   Outpt HD: TTS [place]  4.5h  500/1.5  146kg   2/2.25 bath  Hep 11400   L thigh AVG - parsabiv  - mircera 150 q 2wks - hectorol 4 ug    Assessment/ Plan: 1. SOB/ hypoxemia/ acute resp failure: pulm edema + aspiration per CCM. CXR cleared  2. VDRF: agitation preventing wean from vent, per CCM 3. Shock - resolved 4. ESRD on HD TTS. HD  today, carryover from Sat. UF to dry wt 2L 5. H/o failed renal Tx 6. Morbid obesity 7. BP/volume - 2kg up today, no edema, BP's ok, no BP meds at home 8. DM2   [facility] 12/05/2018, 8:58 AM  Iron/TIBC/Ferritin/ %Sat    Component Value Date/Time   IRON 46 05/26/2018   TIBC 202 05/26/2018   FERRITIN 1,155 05/26/2018   IRONPCTSAT 59 (H) 05/11/2018 0235   Recent Labs  Lab 11/30/18 1710  12/03/18 0404  12/04/18 0639 12/05/18 0526  NA 139   < > 138   < > 141 139  K 4.6   < > 4.5   < > 4.1 4.1  CL 100   < > 102  --  99 103  CO2 21*   < > 22  --  26 23  GLUCOSE 111*   < > 160*  --  133* 116*  BUN 74*   < > 51*  --  95* 137*  CREATININE 17.47*   < > 6.58*  --  8.38* 10.59*  CALCIUM 8.3*   < > 8.4*  --  8.4* 8.6*  PHOS 8.2*   < > 4.0  4.2  --   --   --  ALBUMIN 2.8*   < > 2.6*  --  2.5*  --   INR 1.5*  --   --   --   --   --    < > = values in this interval not displayed.   Recent Labs  Lab 12/04/18 0639  AST 1,217*  ALT 2,036*  ALKPHOS 98  BILITOT 1.1  PROT 6.5   Recent Labs  Lab 12/05/18 0526  WBC 16.0*  HGB 7.7*  HCT 25.3*  PLT 148*

## 2018-12-05 NOTE — Progress Notes (Signed)
[facility] C[facility] CRITICAL CARE PROGRESS NOTE  CTSP re: cuff leak.  On exam, there is a considerable cuff leak.  There may be a small leak, as the ETT cuff appears to hold pressure.  The patient is approaching liberation from mechanical ventilatory support.  However, the patient was recently given additional sedation for bathing and is not currently awake enough to extubate.  At this point, I believe that it would be prudent to wean off sedation in anticipation of liberation from mechanical ventilatory support.  If he fails his weaning trial, then the ETT may be changed over bougie.  However, no specific intervention is required at this time.  Renee Pain, MD Board Certified by the ABIM, [place]

## 2018-12-05 NOTE — Progress Notes (Signed)
Patient is clinically stable at this time. SATs are stable. Assessing patient ETT, at first thought I believed it was the subglottic that was making the leaking sounds around his tube. But upon looking at the vent and seeing that patient is not getting proper returning (expiratory) volumes I checked cuff pressure and noticed that patient is losing cuff pressures. I believe patient pilot balloon is possibly blown. RN notified.

## 2018-12-05 NOTE — Progress Notes (Signed)
MD Carson Myrtle at bedside.

## 2018-12-05 NOTE — Progress Notes (Signed)
ETT had slid out and was at 22cm at the lip. Patient was pushed back in and is measured at 24 at the lip.

## 2018-12-06 ENCOUNTER — Inpatient Hospital Stay (HOSPITAL_COMMUNITY): Payer: 59

## 2018-12-06 LAB — COMPREHENSIVE METABOLIC PANEL
ALT: 1167 U/L — ABNORMAL HIGH (ref 0–44)
AST: 348 U/L — ABNORMAL HIGH (ref 15–41)
Albumin: 2.4 g/dL — ABNORMAL LOW (ref 3.5–5.0)
Alkaline Phosphatase: 101 U/L (ref 38–126)
Anion gap: 12 (ref 5–15)
BUN: 112 mg/dL — ABNORMAL HIGH (ref 6–20)
CO2: 25 mmol/L (ref 22–32)
Calcium: 8.9 mg/dL (ref 8.9–10.3)
Chloride: 102 mmol/L (ref 98–111)
Creatinine, Ser: 9.15 mg/dL — ABNORMAL HIGH (ref 0.61–1.24)
GFR calc Af Amer: 7 mL/min — ABNORMAL LOW (ref >60)
GFR calc non Af Amer: 6 mL/min — ABNORMAL LOW (ref >60)
Glucose, Bld: 136 mg/dL — ABNORMAL HIGH (ref 70–99)
Potassium: 4.3 mmol/L (ref 3.5–5.1)
Sodium: 139 mmol/L (ref 135–145)
Total Bilirubin: 1.3 mg/dL — ABNORMAL HIGH (ref 0.3–1.2)
Total Protein: 6.8 g/dL (ref 6.5–8.1)

## 2018-12-06 LAB — CBC
HCT: 28.6 % — ABNORMAL LOW (ref 39.0–52.0)
Hemoglobin: 8.6 g/dL — ABNORMAL LOW (ref 13.0–17.0)
MCH: 26.9 pg (ref 26.0–34.0)
MCHC: 30.1 g/dL (ref 30.0–36.0)
MCV: 89.4 fL (ref 80.0–100.0)
Platelets: 171 10*3/uL (ref 150–400)
RBC: 3.2 MIL/uL — ABNORMAL LOW (ref 4.22–5.81)
RDW: 17.4 % — ABNORMAL HIGH (ref 11.5–15.5)
WBC: 12.9 10*3/uL — ABNORMAL HIGH (ref 4.0–10.5)
nRBC: 1.2 % — ABNORMAL HIGH (ref 0.0–0.2)

## 2018-12-06 LAB — IRON AND TIBC
Iron: 34 ug/dL — ABNORMAL LOW (ref 45–182)
Saturation Ratios: 19 % (ref 17.9–39.5)
TIBC: 176 ug/dL — ABNORMAL LOW (ref 250–450)
UIBC: 142 ug/dL

## 2018-12-06 LAB — POCT I-STAT 7, (LYTES, BLD GAS, ICA,H+H)
Acid-Base Excess: 2 mmol/L (ref 0.0–2.0)
Bicarbonate: 25.2 mmol/L (ref 20.0–28.0)
Bicarbonate: 26.5 mmol/L (ref 20.0–28.0)
Calcium, Ion: 1.21 mmol/L (ref 1.15–1.40)
Calcium, Ion: 1.18 mmol/L (ref 1.15–1.40)
HCT: 28 % — ABNORMAL LOW (ref 39.0–52.0)
HCT: 27 % — ABNORMAL LOW (ref 39.0–52.0)
Hemoglobin: 9.5 g/dL — ABNORMAL LOW (ref 13.0–17.0)
Hemoglobin: 9.2 g/dL — ABNORMAL LOW (ref 13.0–17.0)
O2 Saturation: 93 %
O2 Saturation: 98 %
Patient temperature: 99.2
Potassium: 4.3 mmol/L (ref 3.5–5.1)
Potassium: 4.5 mmol/L (ref 3.5–5.1)
Sodium: 138 mmol/L (ref 135–145)
Sodium: 137 mmol/L (ref 135–145)
TCO2: 26 mmol/L (ref 22–32)
TCO2: 28 mmol/L (ref 22–32)
pCO2 arterial: 41.7 mmHg (ref 32.0–48.0)
pCO2 arterial: 41.3 mmHg (ref 32.0–48.0)
pH, Arterial: 7.39 (ref 7.350–7.450)
pH, Arterial: 7.415 (ref 7.350–7.450)
pO2, Arterial: 69 mmHg — ABNORMAL LOW (ref 83.0–108.0)
pO2, Arterial: 102 mmHg (ref 83.0–108.0)

## 2018-12-06 LAB — GLUCOSE, CAPILLARY
Glucose-Capillary: 135 mg/dL — ABNORMAL HIGH (ref 70–99)
Glucose-Capillary: 111 mg/dL — ABNORMAL HIGH (ref 70–99)
Glucose-Capillary: 131 mg/dL — ABNORMAL HIGH (ref 70–99)
Glucose-Capillary: 132 mg/dL — ABNORMAL HIGH (ref 70–99)
Glucose-Capillary: 124 mg/dL — ABNORMAL HIGH (ref 70–99)

## 2018-12-06 LAB — PATHOLOGIST SMEAR REVIEW

## 2018-12-06 LAB — PHOSPHORUS: Phosphorus: 5.2 mg/dL — ABNORMAL HIGH (ref 2.5–4.6)

## 2018-12-06 MED ORDER — QUETIAPINE FUMARATE 50 MG PO TABS: 100.0000 mg | ORAL_TABLET | Freq: Every day | ORAL | Status: DC

## 2018-12-06 MED ORDER — ORAL CARE MOUTH RINSE: 15.0000 mL | Freq: Two times a day (BID) | OROMUCOSAL | Status: DC

## 2018-12-06 MED ORDER — CHLORHEXIDINE GLUCONATE 0.12 % MT SOLN: 15.0000 mL | Freq: Two times a day (BID) | OROMUCOSAL | Status: DC

## 2018-12-06 MED ORDER — ORAL CARE MOUTH RINSE
15.0000 mL | Freq: Two times a day (BID) | OROMUCOSAL | Status: DC
  Administered 2018-12-07 (×2): 15 mL via OROMUCOSAL

## 2018-12-06 MED ORDER — DARBEPOETIN ALFA 100 MCG/0.5ML IJ SOSY
100.0000 ug | PREFILLED_SYRINGE | INTRAMUSCULAR | Status: DC
  Administered 2018-12-07: 100 ug via INTRAVENOUS
  Filled 2018-12-06 (×2): qty 0.5

## 2018-12-06 MED ORDER — MIDAZOLAM HCL 2 MG/2ML IJ SOLN
INTRAMUSCULAR | Status: AC
  Administered 2018-12-06: 06:00:00 2 mg
  Filled 2018-12-06: qty 2

## 2018-12-06 MED ORDER — HYDRALAZINE HCL 20 MG/ML IJ SOLN
10.0000 mg | INTRAMUSCULAR | Status: DC | PRN
  Administered 2018-12-06 – 2018-12-09 (×2): 20 mg via INTRAVENOUS
  Filled 2018-12-06 (×2): qty 1

## 2018-12-06 MED ORDER — CHLORHEXIDINE GLUCONATE CLOTH 2 % EX PADS
6.0000 | MEDICATED_PAD | Freq: Every day | CUTANEOUS | Status: DC
  Administered 2018-12-07 – 2018-12-12 (×5): 6 via TOPICAL

## 2018-12-06 MED ORDER — MIDAZOLAM HCL 2 MG/2ML IJ SOLN: 2.0000 mg | Freq: Once | INTRAMUSCULAR | Status: AC

## 2018-12-06 MED ORDER — HALOPERIDOL LACTATE 5 MG/ML IJ SOLN
1.0000 mg | INTRAMUSCULAR | Status: DC | PRN
  Administered 2018-12-06: 16:00:00 3 mg via INTRAVENOUS
  Administered 2018-12-06: 11:00:00 2 mg via INTRAVENOUS
  Filled 2018-12-06 (×2): qty 1

## 2018-12-06 MED ORDER — CHLORHEXIDINE GLUCONATE 0.12 % MT SOLN
15.0000 mL | Freq: Two times a day (BID) | OROMUCOSAL | Status: DC
  Administered 2018-12-06 – 2018-12-07 (×2): 15 mL via OROMUCOSAL

## 2018-12-06 NOTE — Progress Notes (Signed)
At start of shift patient found on PRVC mode [phone].  Tolerating well. No cuff leak noted. Maintained all shift.  Patient was previously switched to SIMV/PS/PRVC due to possible cuff leak. No issues with PRVC mode, no changes made.

## 2018-12-06 NOTE — Progress Notes (Signed)
[facility] ROUNDING NOTE   Subjective:   This is a 43 year old gentleman was admitted with pulmonary edema and acute hypoxic respiratory failure with aspiration and shock requiring a brief treatment with CRRT 12/01/2018 - 12/03/2018.  End-stage renal disease Tuesday Thursday Saturday dialysis received dialysis  12/05/2018 with ultrafiltration 2 L.  He has a history of failed renal transplant 10/2012 followed by [facility], not on any immunosuppressant drugs.  He has a history of hypertension diabetes mellitus type 2 and congestive heart failure with morbid obesity and obstructive sleep apnea on CPAP.  He also has a history of gastroesophageal reflux disease.  Blood pressure 132/76 pulse 72 temperature 99 O2 sats 89% FiO2 30% ventilator  ABG pH 7.39 PCO2 41 PO2 69 sodium 139 potassium 4.3 chloride 102 CO2 25 BUN 112 creatinine 9.1 glucose 136 calcium 8.9 albumin 2.4 AST 348 ALT 1167, hemoglobin 9.5 WBC 12.9 platelets 171  Pepcid 20 mg p.o., pro-stat 60 cc 3 times daily, insulin sliding scale  Cefepime 1 g every 24 hours  Feeding supplement vital AF 1 L at 40 cc an hour  Chest x-ray 12/06/2018 endotracheal tube and NG tube in stable position left IJ stable cardiomegaly  Ultrasound abdomen 12/02/2018 echogenicity of liver no other acute pathology unremarkable globe gallbladder with no evidence of dilation  2D echo EF 60 to 65% 12/01/2018   Objective:  Vital signs in last 24 hours:  Temp:  [97.8 F (36.6 C)-101.7 F (38.7 C)] 99 F (37.2 C) (06/29 0700) Pulse Rate:  [72-83] 73 (06/29 0745) Resp:  [18-35] 30 (06/29 0745) BP: (103-163)/(52-83) 133/72 (06/29 0745) SpO2:  [87 %-100 %] 94 % (06/29 0747) FiO2 (%):  [30 %-40 %] 30 % (06/29 0747) Weight:  [143.9 kg-146 kg] 143.9 kg (06/29 0414)  Weight change: 3 kg Filed Weights   12/05/18 0730 12/05/18 1115 12/06/18 0414  Weight: (!) 148 kg (!) 146 kg (!) 143.9 kg    Intake/Output: I/O last 3 completed shifts: In: 2846.3  [I.V.:836.3; NG/GT:1810; IV Piggyback:200] Out: 2000 [Other:2000]   Intake/Output this shift:  Total I/O In: 150 [NG/GT:150] Out: -    Agitated CVS- RRR no JVP faint systolic murmur RS- CTA intubated and ventilator rhonchorous breath sounds ABD- BS present soft non-distended EXT-1+ peripheral edema   Basic Metabolic Panel: Recent Labs  Lab 11/30/18 0855  11/30/18 1710  12/01/18 0413  12/01/18 1752  12/02/18 0404  12/02/18 1500 12/03/18 0404 12/03/18 1050 12/04/18 0639 12/05/18 0526 12/06/18 0631 12/06/18 0708  NA 136   < > 139   < > 138   < > 138  --  138   < > 138 138 137 141 139 139 138  K 4.8   < > 4.6   < > 6.7*   < > 5.9*   < > 4.9   < > 4.5 4.5 4.4 4.1 4.1 4.3 4.3  CL 96*  --  100  --  98  --  96*  --  97*  --  97* 102  --  99 103 102  --   CO2 23  --  21*  --  20*  --  17*  --  20*  --  23 22  --  '26 23 25  ' --   GLUCOSE 106*  --  111*  --  89  --  164*  --  168*  --  140* 160*  --  133* 116* 136*  --   BUN 69*  --  74*  --  60*  --  62*  --  50*  --  48* 51*  --  95* 137* 112*  --   CREATININE 15.23*  --  17.47*  --  13.43*  --  13.96*  --  9.50*  --  7.93* 6.58*  --  8.38* 10.59* 9.15*  --   CALCIUM 8.7*  --  8.3*  --  8.2*  --  7.4*  --  7.5*  --  8.0* 8.4*  --  8.4* 8.6* 8.9  --   MG 2.4  --  2.4  --  2.4  --   --   --  2.3  --   --  2.5*  --   --   --   --   --   PHOS 6.4*  --  8.2*  --  9.3*  --  9.1*  --  6.4*  --  5.1* 4.0  4.2  --   --   --   --   --    < > = values in this interval not displayed.    Liver Function Tests: Recent Labs  Lab 11/30/18 1710  12/01/18 1250  12/02/18 0404 12/02/18 1500 12/03/18 0404 12/04/18 0639 12/06/18 0631  AST 12*  --  7,277*  --  7,556*  --   --  1,217* 348*  ALT 12  --  2,757*  --  3,595*  --   --  2,036* 1,167*  ALKPHOS 42  --  57  --  81  --   --  98 101  BILITOT 1.1  --  1.7*  --  1.7*  --   --  1.1 1.3*  PROT 6.9  --  6.7  --  7.0  --   --  6.5 6.8  ALBUMIN 2.8*   < > 2.7*   < > 2.8*  2.8* 2.7* 2.6* 2.5*  2.4*   < > = values in this interval not displayed.   No results for input(s): LIPASE, AMYLASE in the last 168 hours. No results for input(s): AMMONIA in the last 168 hours.  CBC: Recent Labs  Lab 11/30/18 0855  12/02/18 0404  12/03/18 0404 12/03/18 1050 12/04/18 0639 12/05/18 0526 12/06/18 0631 12/06/18 0708  WBC 11.7*   < > 10.6*  --  9.5  --  15.5* 16.0* 12.9*  --   NEUTROABS 9.7*  --   --   --   --   --   --   --   --   --   HGB 8.1*   < > 8.9*   < > 7.7* 8.8* 8.3* 7.7* 8.6* 9.5*  HCT 25.7*   < > 29.6*   < > 25.1* 26.0* 27.6* 25.3* 28.6* 28.0*  MCV 86.8   < > 89.7  --  87.5  --  89.6 89.4 89.4  --   PLT 327   < > 258  --  196  --  193 148* 171  --    < > = values in this interval not displayed.    Cardiac Enzymes: No results for input(s): CKTOTAL, CKMB, CKMBINDEX, TROPONINI in the last 168 hours.  BNP: Invalid input(s): POCBNP  CBG: Recent Labs  Lab 12/05/18 1525 12/05/18 1936 12/05/18 2330 12/06/18 0404 12/06/18 0723  GLUCAP 103* 127* 123* 135* 111*    Microbiology: Results for orders placed or performed during the hospital encounter of 11/30/18  SARS Coronavirus 2 (CEPHEID- Performed in [facility] hospital lab), [facility]  Status: None   Collection Time: 11/30/18  8:35 AM   Specimen: Nasopharyngeal Swab  Result Value Ref Range Status   SARS Coronavirus 2 NEGATIVE NEGATIVE Final    Comment: (NOTE) If result is NEGATIVE SARS-CoV-2 target nucleic acids are NOT DETECTED. The SARS-CoV-2 RNA is generally detectable in upper and lower  respiratory specimens during the acute phase of infection. The lowest  concentration of SARS-CoV-2 viral copies this assay can detect is 250  copies / mL. A negative result does not preclude SARS-CoV-2 infection  and should not be used as the sole basis for treatment or other  patient management decisions.  A negative result may occur with  improper specimen collection / handling, submission of specimen other  than  nasopharyngeal swab, presence of viral mutation(s) within the  areas targeted by this assay, and inadequate number of viral copies  (<250 copies / mL). A negative result must be combined with clinical  observations, patient history, and epidemiological information. If result is POSITIVE SARS-CoV-2 target nucleic acids are DETECTED. The SARS-CoV-2 RNA is generally detectable in upper and lower  respiratory specimens dur ing the acute phase of infection.  Positive  results are indicative of active infection with SARS-CoV-2.  Clinical  correlation with patient history and other diagnostic information is  necessary to determine patient infection status.  Positive results do  not rule out bacterial infection or co-infection with other viruses. If result is PRESUMPTIVE POSTIVE SARS-CoV-2 nucleic acids MAY BE PRESENT.   A presumptive positive result was obtained on the submitted specimen  and confirmed on repeat testing.  While 2019 novel coronavirus  (SARS-CoV-2) nucleic acids may be present in the submitted sample  additional confirmatory testing may be necessary for epidemiological  and / or clinical management purposes  to differentiate between  SARS-CoV-2 and other Sarbecovirus currently known to infect humans.  If clinically indicated additional testing with an alternate test  methodology [phone]) is advised. The SARS-CoV-2 RNA is generally  detectable in upper and lower respiratory sp ecimens during the acute  phase of infection. The expected result is Negative. Fact Sheet for Patients:  StrictlyIdeas.no Fact Sheet for Healthcare Providers: BankingDealers.co.za This test is not yet approved or cleared by the [place] FDA and has been authorized for detection and/or diagnosis of SARS-CoV-2 by FDA under an Emergency Use Authorization (EUA).  This EUA will remain in effect (meaning this test can be used) for the duration of  the COVID-19 declaration under Section 564(b)(1) of the Act, 21 U.S.C. section 360bbb-3(b)(1), unless the authorization is terminated or revoked sooner. Performed at [facility], [place] [address], [place], [place] [address]   Blood culture (routine x 2)     Status: None   Collection Time: 11/30/18  8:57 AM   Specimen: BLOOD RIGHT HAND  Result Value Ref Range Status   Specimen Description BLOOD RIGHT HAND  Final   Special Requests   Final    BOTTLES DRAWN AEROBIC AND ANAEROBIC Blood Culture adequate volume   Culture   Final    NO GROWTH 5 DAYS Performed at [facility], [place] [address] [place], [place] [address]    Report Status 12/05/2018 FINAL  Final  Culture, blood (routine x 2)     Status: None   Collection Time: 11/30/18  5:10 PM   Specimen: BLOOD LEFT HAND  Result Value Ref Range Status   Specimen Description BLOOD LEFT HAND  Final   Special Requests   Final  BOTTLES DRAWN AEROBIC ONLY Blood Culture results may not be optimal due to an inadequate volume of blood received in culture bottles   Culture   Final    NO GROWTH 5 DAYS Performed at [facility], [place] [address], [place], [place] [address]    Report Status 12/05/2018 FINAL  Final  MRSA PCR Screening     Status: None   Collection Time: 11/30/18  5:20 PM   Specimen: Nasal Mucosa; Nasopharyngeal  Result Value Ref Range Status   MRSA by PCR NEGATIVE NEGATIVE Final    Comment:        The GeneXpert MRSA Assay (FDA approved for NASAL specimens only), is one component of a comprehensive MRSA colonization surveillance program. It is not intended to diagnose MRSA infection nor to guide or monitor treatment for MRSA infections. Performed at [facility], [place] [address] [place], [place] [address]   Culture, respiratory (tracheal aspirate)     Status: None   Collection Time: 12/01/18  3:16 AM   Specimen: Tracheal Aspirate; Respiratory  Result Value Ref Range Status   Specimen Description  TRACHEAL ASPIRATE  Final   Special Requests NONE  Final   Gram Stain   Final    RARE WBC PRESENT,BOTH PMN AND MONONUCLEAR NO ORGANISMS SEEN    Culture   Final    NO GROWTH 2 DAYS Performed at [facility], [address], [place], [place] [address]    Report Status 12/03/2018 FINAL  Final    Coagulation Studies: No results for input(s): LABPROT, INR in the last 72 hours.  Urinalysis: No results for input(s): COLORURINE, LABSPEC, PHURINE, GLUCOSEU, HGBUR, BILIRUBINUR, KETONESUR, PROTEINUR, UROBILINOGEN, NITRITE, LEUKOCYTESUR in the last 72 hours.  Invalid input(s): APPERANCEUR    Imaging: Dg Chest Port 1 View  Result Date: 12/06/2018 CLINICAL DATA:  Acute respiratory failure. EXAM: PORTABLE CHEST 1 VIEW COMPARISON:  12/05/2018. FINDINGS: Endotracheal tube, NG tube noted stable position. Left subclavian line noted coursing up the left IJ. This is in unchanged position. Stable cardiomegaly. Stable bibasilar atelectasis. IMPRESSION: 1. Endotracheal tube and NG tube stable position. Left subclavian line courses up the left IJ in unchanged position. 2.  Stable cardiomegaly. 3.  Stable bibasilar atelectasis. Electronically Signed   By: Marcello Moores  Register   On: 12/06/2018 06:39   Dg Chest Port 1 View  Result Date: 12/05/2018 CLINICAL DATA:  Acute respiratory failure EXAM: PORTABLE CHEST 1 VIEW COMPARISON:  Chest x-rays dated 12/04/2018 and 12/03/2018 FINDINGS: Stable cardiomegaly. Endotracheal tube is adequately positioned with tip approximately 5 cm above the carina. Enteric tube passes below the diaphragm. LEFT subclavian central line with tip again directed upwards into the internal jugular vein. Streaky bibasilar opacities, most likely atelectasis. No new lung findings. No pleural effusion or pneumothorax seen. IMPRESSION: 1. Endotracheal tube adequately positioned with tip approximately 5 cm above the carina. 2. Stable cardiomegaly. 3. Streaky bibasilar opacities, most likely  atelectasis. 4. LEFT subclavian central line with tip directed upwards into the LEFT internal jugular vein, as previously described. Electronically Signed   By: Franki Cabot M.D.   On: 12/05/2018 05:33     Medications:   . sodium chloride 10 mL/hr at 12/06/18 0700  . sodium chloride Stopped (12/04/18 0930)  . ceFEPime (MAXIPIME) IV Stopped (12/05/18 2242)  . dexmedetomidine (PRECEDEX) IV infusion 1.2 mcg/kg/hr (12/06/18 0839)  . feeding supplement (VITAL AF 1.2 CAL) 1,000 mL (12/06/18 0353)   . B-complex with vitamin C  1 tablet Per Tube  Daily  . chlorhexidine gluconate (MEDLINE KIT)  15 mL Mouth Rinse BID  . Chlorhexidine Gluconate Cloth  6 each Topical Q0600  . famotidine  20 mg Per Tube Daily  . feeding supplement (PRO-STAT SUGAR FREE 64)  60 mL Per Tube TID  . heparin injection (subcutaneous)  5,000 Units Subcutaneous Q8H  . insulin aspart  0-9 Units Subcutaneous Q4H  . mouth rinse  15 mL Mouth Rinse 10 times per day  . promethazine  12.5 mg Intravenous Once  . senna-docusate  1 tablet Per Tube Daily  . sucroferric oxyhydroxide  1,000 mg Oral TID WC   sodium chloride, acetaminophen **OR** acetaminophen, albuterol, bisacodyl, fentaNYL (SUBLIMAZE) injection, heparin, ondansetron **OR** ondansetron (ZOFRAN) IV  Assessment/ Plan:   ESRD-Tuesday Thursday Saturday dialysis.  Did receive dialysis Sunday due to high volume Saturday.  He had ultrafiltration of 2 L.  He received a brief period of CRRT on admission due to hypotension and shock.  Anemia appears stable last hemoglobin 9.5 we will check iron studies.  And add darbepoetin.  Bones will check phosphorus and PTH.  Does not appear to be on vitamin D analogs or binders at this time.  Hypotension/shock off pressors appears to have resolved  Ventilator dependent respiratory failure possible extubation defer to CCM service.  Admitted with volume overload pulmonary edema  Diastolic dysfunction stable  Diabetes mellitus insulin  sliding scale  Malnutrition continues on Protostat feeding supplements.   LOS: [place] '@TODAY' '@9' :05 AM

## 2018-12-06 NOTE — Progress Notes (Signed)
eLink Physician-Brief Progress Note Patient Name: DEWON MENDIZABAL DOB: 11-30-1975 MRN: [id number]   Date of Service  12/06/2018  HPI/Events of Note  Camera: Bed side 2 RN holding his arm. Trying to pull ET tubes. Received 100 mcg fenta 10  Ins ago. VS stable. sats OK. Not following commands. Obese.   Data reviewed.  Cuff leak yesterday , Vt/Ve ok.  eICU Interventions  - stat Versed 2 mg IV once - get ABG stat.  - consider going on fenta gtt. On maxed out on precedex gtt.      Intervention Category Intermediate Interventions: Respiratory distress - evaluation and management  Elmer Sow 12/06/2018, 6:25 AM

## 2018-12-06 NOTE — Progress Notes (Signed)
Patient extremely agitated. Went to get PRN sedation and patient found to have ETT pulled out. Md notified. Patient placed on 15L NRB. Patient is 98% O2 sat.  Will continue to monitor.

## 2018-12-06 NOTE — Progress Notes (Signed)
RT called to patient room due to patient self-extubating.  Upon arrival patient was being placed on non-rebreather mask by RNs.  Sats improved to 100%.  Patient trying to reach for mask off of face.  MD was also present in room.  Will continue to monitor.

## 2018-12-06 NOTE — Progress Notes (Signed)
Pt didn't tolerate wean at this time. Pt became extremely agitated with a high RR. Pt was placed on previous mode and is stable at this time.

## 2018-12-06 NOTE — Progress Notes (Signed)
RT NOTES:  Patient on 100% Nonrebreather sating 95% with labored breathing. Per Dr Elsworth Soho, placed patient on BIPAP (NIV PCV/IP 10/16/+5/50%).

## 2018-12-06 NOTE — Progress Notes (Signed)
NAME:  KONNOR VONDRASEK, MRN:  [id number], DOB:  July 31, 1975, LOS: 6 ADMISSION DATE:  11/30/2018, CONSULTATION DATE:  11/30/18 REFERRING MD:  Wilson Singer  CHIEF COMPLAINT:  SOB   Brief History   JOHNOTHAN BASCOMB is a 43 y.o. male who presented to [facility] ED 6/23 with respiratory distress and hypoxic respiratory failure presumed due to acute pulmonary edema after missing HD. Also possible HCAP. Required intubation in ED due to extremis. Developed ARDS and florid septic shock requiring epinephrine drip and multiorgan failure    Past Medical History  OSA on CPAP, PAF, HTN, combined CHF (echo from 2019 with EF 55-60%, G2DD), ESRD on HD s/p renal transplant 2014, DM, hypothyroidism.  Significant Hospital Events   6/23 > admit. 6/24> worsening shock, CRRT initiated 6/25> shock improving however troponins continue to climb, LFTs remain elevated. Continues on CRRt. CXr with some improvement  6/26 >>Off Epi gtt and continuing to wean NE. Vaso remains at consistent dose  6/28 >> Concerns for cuff leak overnight, changed to SIMV pressure settings, leak resolved after pushing ET tube to 25 cm at lip  Consults:  Nephrology. Cardiology Vascular Surgery   Procedures:  ETT 6/23 >  L [place] CVC 6/23>   R fem trialysis catheter 6/24>  Significant Diagnostic Tests:  ECHO 6/24> LVEF 55-60%, RV D-shaped interventricular septum suggests pressure/volume overload. 6/25 RUQ [place] >> neg  Micro Data:  Blood 6/23 > no growth  Sputum 6/23 > no organisms seen  COVID 6/23 > negative.  Antimicrobials:  Vanc 6/23 > 6/26 Cefepime 6/23 >   Interim history/subjective:   He is critically ill, intubated - off Levophed drip Febrile 101 overnight Extreme agitation this morning requiring Versed after frequent doses of fentanyl, Precedex at max dose    Objective:  Blood pressure 132/76, pulse 72, temperature 99 F (37.2 C), temperature source Axillary, resp. rate (!) 21, height 6\' 2"  (1.88 m), weight (!) 143.9 kg,  SpO2 (!) 88 %.    Vent Mode: PRVC FiO2 (%):  [30 %-40 %] 30 % Set Rate:  [12 bmp-20 bmp] 20 bmp Vt Set:  [560 mL] 560 mL PEEP:  [5 cmH20] 5 cmH20 Pressure Support:  [8 cmH20-10 cmH20] 10 cmH20 Plateau Pressure:  [19 cmH20-25 cmH20] 23 cmH20   Intake/Output Summary (Last 24 hours) at 12/06/2018 0925 Last data filed at 12/06/2018 0900 Gross per 24 hour  Intake 2471.93 ml  Output 2000 ml  Net 471.93 ml   Filed Weights   12/05/18 0730 12/05/18 1115 12/06/18 0414  Weight: (!) 148 kg (!) 146 kg (!) 143.9 kg    Examination:  General: Obese adult male, intubated, supine in bed Neuro: Sedated, RA SS -3 does not follow commands HEENT: NCAT, ETT secure OGT secure, trachea midline, pink mmm Cardiovascular: RRR s1s2 no rgm Lungs: Diminished bibasilar breath sounds Symmetrical chest expansion  Abdomen: Obese soft round, non-distended.  Musculoskeletal: Symmetrical bulk and tone. No cyanosis, no clubbing  Skin: clean dry cool, without rash.    CX-ray 6/29 personally reviewed shows devices in position except malpositioned left subclavian catheter and bibasal atelectasis  Assessment & Plan:   ARDS -Acute pulmonary edema in setting of missed iHD + aspiration -Much improved -Cuff leak 6/28 -resolved after pushing ET tube to 25 cm P - ct spontaneous breathing trials , mental status limiting factor for extubation   Acute encephalopathy-likely due to residual sedation, Versed drip turned off 6/26, may also be ICU delirium  goal RA SS 0 to -1 -use  Precedex gtt + intermittent fentanyl -Use Versed only for breakthrough -Add Seroquel 100 daily   Septic shock due to aspiration, improving - Tracheal aspirate 6/24 reveals no organisms  - Bcx 6/24 no growth  - SARS CoV2 negative  P - Continue cefepime.  Okay to stop today even though he had a fever overnight - MAP goal >65, Off Levophed, DC midodrine   RV dysfunction -likely related to ARDS but presumed pre-existing PH -revealed on ECHO  6/24, suggestive of volume overload  P Will need repeat echo after acute issues resolved   Transaminitis -likely shock liver in setting of above, possible contributing factor acute RV dysfunction causing hepatic congestion -improved P -Continue to trend LFTs  - tube feeds to goal   ESRD on OP HD following failed renal transplant  -Transient CRRT  P - Nephrology following, off CRRT  -Transitioned to intermittent dialysis, session last 6/28   Hx Hypertension. - holding antihypertensives in setting of shock   Anemia of chronic disease. - Transfuse for Hgb < 7.  DM P -SSI    Constipation -in setting of opioids, decreased mobility, critical illness P  PRN suppository    Resolved problems- Elevated troponin, likely demand ischemia -troponin > peak 7724 on 6/25   P - cardiology seen  -off heparin  Summary-prognosis improving, mental status main limiting factor to extubation now  Best Practice:  Diet: NPO , TFs Pain/Anxiety/Delirium protocol (if indicated): Fent int, precedex gtt VAP protocol (if indicated): In place. DVT prophylaxis: Heparin sq GI prophylaxis: Pepcid  Glucose control: SSI Mobility: Bedrest. Code Status: Full. Family Communication: 6/27 sister  Disposition: ICU.  The patient is critically ill with multiple organ systems failure and requires high complexity decision making for assessment and support, frequent evaluation and titration of therapies, application of advanced monitoring technologies and extensive interpretation of multiple databases. Critical Care Time devoted to patient care services described in this note independent of APP/resident  time is 34 minutes.       Kara Mead MD. Shade Flood. [place] Pager [phone] If no response call [phone]    12/06/2018, 9:25 AM

## 2018-12-06 NOTE — Progress Notes (Signed)
Assisted tele visit to patient with family member.  Thomas, Lajeana Strough Renee, RN   

## 2018-12-07 ENCOUNTER — Inpatient Hospital Stay (HOSPITAL_COMMUNITY): Payer: 59

## 2018-12-07 LAB — GLUCOSE, CAPILLARY
Glucose-Capillary: 102 mg/dL — ABNORMAL HIGH (ref 70–99)
Glucose-Capillary: 104 mg/dL — ABNORMAL HIGH (ref 70–99)
Glucose-Capillary: 104 mg/dL — ABNORMAL HIGH (ref 70–99)
Glucose-Capillary: 106 mg/dL — ABNORMAL HIGH (ref 70–99)
Glucose-Capillary: 111 mg/dL — ABNORMAL HIGH (ref 70–99)
Glucose-Capillary: 118 mg/dL — ABNORMAL HIGH (ref 70–99)
Glucose-Capillary: 96 mg/dL (ref 70–99)

## 2018-12-07 LAB — CBC
HCT: 26.7 % — ABNORMAL LOW (ref 39.0–52.0)
Hemoglobin: 8.2 g/dL — ABNORMAL LOW (ref 13.0–17.0)
MCH: 27.3 pg (ref 26.0–34.0)
MCHC: 30.7 g/dL (ref 30.0–36.0)
MCV: 89 fL (ref 80.0–100.0)
Platelets: 162 10*3/uL (ref 150–400)
RBC: 3 MIL/uL — ABNORMAL LOW (ref 4.22–5.81)
RDW: 18.4 % — ABNORMAL HIGH (ref 11.5–15.5)
WBC: 11.2 10*3/uL — ABNORMAL HIGH (ref 4.0–10.5)
nRBC: 0.2 % (ref 0.0–0.2)

## 2018-12-07 LAB — BASIC METABOLIC PANEL
Anion gap: 14 (ref 5–15)
BUN: 145 mg/dL — ABNORMAL HIGH (ref 6–20)
CO2: 24 mmol/L (ref 22–32)
Calcium: 8.9 mg/dL (ref 8.9–10.3)
Chloride: 101 mmol/L (ref 98–111)
Creatinine, Ser: 11.08 mg/dL — ABNORMAL HIGH (ref 0.61–1.24)
GFR calc Af Amer: 6 mL/min — ABNORMAL LOW (ref >60)
GFR calc non Af Amer: 5 mL/min — ABNORMAL LOW (ref >60)
Glucose, Bld: 128 mg/dL — ABNORMAL HIGH (ref 70–99)
Potassium: 5.1 mmol/L (ref 3.5–5.1)
Sodium: 139 mmol/L (ref 135–145)

## 2018-12-07 LAB — PARATHYROID HORMONE, INTACT (NO CA): PTH: 136 pg/mL — ABNORMAL HIGH (ref 15–65)

## 2018-12-07 MED ORDER — HEPARIN SODIUM (PORCINE) 1000 UNIT/ML IJ SOLN
INTRAMUSCULAR | Status: AC
  Administered 2018-12-07: 14:00:00 5000 [IU] via INTRAVENOUS_CENTRAL
  Filled 2018-12-07: qty 5

## 2018-12-07 MED ORDER — ORAL CARE MOUTH RINSE
15.0000 mL | Freq: Two times a day (BID) | OROMUCOSAL | Status: DC
  Administered 2018-12-07 – 2018-12-12 (×9): 15 mL via OROMUCOSAL

## 2018-12-07 MED ORDER — DARBEPOETIN ALFA 100 MCG/0.5ML IJ SOSY
PREFILLED_SYRINGE | INTRAMUSCULAR | Status: AC
  Administered 2018-12-07: 14:00:00 100 ug via INTRAVENOUS
  Filled 2018-12-07: qty 0.5

## 2018-12-07 NOTE — Progress Notes (Signed)
Assisted tele visit to patient with mother.  Cleotis Nipper, RN

## 2018-12-07 NOTE — Progress Notes (Signed)
Spoke with Dr. Justin Mend regarding removal of HD catheter. MD states no disapproval of removal of the catheter. Will update IV team when dialysis is finished.

## 2018-12-07 NOTE — Progress Notes (Signed)
Rehab Admissions Coordinator Note:  Per PT recommendation, patient was screened by Michel Santee for appropriateness for an Inpatient Acute Rehab Consult.  Will continue to follow to determine if CIR remains appropriate as pt able to participate more with therapy.   Michel Santee 12/07/2018, 12:42 PM  I can be reached at [phone].

## 2018-12-07 NOTE — Progress Notes (Signed)
Pt was taken off and placed on 6 L HFNC. Pt is stable at this time. RT will continue to monitor.

## 2018-12-07 NOTE — Progress Notes (Signed)
NAME:  [name], MRN:  [id number], DOB:  Jul 10, 1975, LOS: 7 ADMISSION DATE:  11/30/2018, CONSULTATION DATE:  11/30/18 REFERRING MD:  Wilson Singer  CHIEF COMPLAINT:  SOB   Brief History   [name] is a 43 y.o. male who presented to [facility] ED 6/23 with respiratory distress and hypoxic respiratory failure presumed due to acute pulmonary edema after missing HD. Also possible HCAP. Required intubation in ED due to extremis. Developed ARDS and florid septic shock requiring epinephrine drip and multiorgan failure  Past Medical History  OSA on CPAP, PAF, HTN, combined CHF (echo from 2019 with EF 55-60%, G2DD), ESRD on HD s/p renal transplant 2014, DM, hypothyroidism.  Significant Hospital Events   6/23 > admit. 6/24> worsening shock, CRRT initiated 6/25> shock improving however troponins continue to climb, LFTs remain elevated. Continues on CRRt. CXr with some improvement  6/26 >Off Epi gtt and continuing to wean NE. Vaso remains at consistent dose  6/28 > Concerns for cuff leak overnight, changed to SIMV pressure settings, leak resolved after pushing ET tube to 25 cm at lip 6/29 > Self-Extubated   Consults:  Nephrology. Cardiology Vascular Surgery   Procedures:  ETT 6/23 >6/29 L [place] CVC 6/23>   R fem trialysis catheter 6/24>  Significant Diagnostic Tests:  ECHO 6/24> LVEF 55-60%, RV D-shaped interventricular septum suggests pressure/volume overload. 6/25 RUQ [place] > neg  Micro Data:  Blood 6/23 > no growth  Sputum 6/23 > no organisms seen  COVID 6/23 > negative.  Antimicrobials:  Vanc 6/23 > 6/26 Cefepime 6/23 > 6/29  Interim history/subjective:  Self-Extubated overnight. Remains encephalopathic, on Percedex gtt   Objective:  Blood pressure (!) 142/87, pulse 69, temperature (!) 97.4 F (36.3 C), temperature source Axillary, resp. rate 16, height 6\' 2"  (1.88 m), weight (!) 144 kg, SpO2 96 %.    Vent Mode: BIPAP;PCV FiO2 (%):  [50 %] 50 % Set Rate:  [16 bmp] 16 bmp  PEEP:  [5 cmH20] 5 cmH20 Plateau Pressure:  [16 cmH20] 16 cmH20   Intake/Output Summary (Last 24 hours) at 12/07/2018 1129 Last data filed at 12/07/2018 0900 Gross per 24 hour  Intake 1139.72 ml  Output -  Net 1139.72 ml   Filed Weights   12/05/18 1115 12/06/18 0414 12/07/18 0500  Weight: (!) 146 kg (!) 143.9 kg (!) 144 kg    Examination:  General: Obese adult male, no distress, lying in bed  Neuro: eyes opens, wiggles toes to command, disoriented, slow to respond  HEENT: MMM Cardiovascular: RRR, no MRG Lungs: Clear breath sounds, no wheeze/crackles  Abdomen: Obese soft round, non-distended.  Musculoskeletal: Symmetrical bulk and tone. No cyanosis, no clubbing  Skin: clean dry cool, without rash.   Assessment & Plan:   ARDS in setting of pulmonary edema due to missed HD, concern for aspiration  Plan -Wean Supplemental Oxygen to maintain saturation >92 (currenly on 6L) -Pulmonary Hygiene   Acute encephalopathy likely due to residual sedation vs ICU delirium  Plan -Titrate Precedex gtt to maintain RASS 0  - Seroquel 100 daily (on hold until taking PO medications)   Septic shock due to aspiration, improving - Tracheal aspirate 6/24 reveals no organisms  - Bcx 6/24 no growth  - SARS CoV2 negative  Plan - Cefepime stopped 6/29 - Trend WBC and Fever Curve   RV dysfunction -likely related to ARDS but presumed pre-existing PH -revealed on ECHO 6/24, suggestive of volume overload  Plan Will need repeat echo after acute issues resolved  Transaminitis -likely shock liver in setting of above, possible contributing factor acute RV dysfunction causing hepatic congestion -improved Plan -Continue to trend LFTs  -NPO until mentation improves   ESRD on OP HD following failed renal transplant  -Transient CRRT  Plan - Nephrology following -Transitioned to iHD >> TTRS, HD planned today   Hx Hypertension. Plan - holding antihypertensives in setting of shock   Anemia of  chronic disease. Plan - Transfuse for Hgb < 7.  DM Plan -Trend glucose  -SSI   Constipation -in setting of opioids, decreased mobility, critical illness Plan  PRN suppository   Best Practice:  Diet: NPO  DVT prophylaxis: Heparin sq GI prophylaxis: Pepcid  Glucose control: SSI Mobility: Bedrest. Code Status: Full. Family Communication: updated 6/30 Disposition: ICU.   [name], AGACNP-BC [facility]  PCCM Pgr: [phone]

## 2018-12-07 NOTE — Progress Notes (Signed)
Spoke with Autumn, RN regarding removal of [place].  At this time patient is still on CRRT therefore cannot be removed.  Autumn to follow up with nephrology as well to get approval to remove [facility] and notify [facility]AST when line can be removed.  Carolee Rota, RN [facility]

## 2018-12-07 NOTE — Progress Notes (Signed)
SLP Cancellation Note  Patient Details Name: [name] MRN: [id number] DOB: 04/17/1976   Cancelled treatment:       Reason Eval/Treat Not Completed: Patient not medically ready. Mentation not appropriate for swallow eval. Pt is not managing secretions. Will follow for readiness.    Bryon Parker, Katherene Ponto 12/07/2018, 10:44 AM

## 2018-12-07 NOTE — Progress Notes (Signed)
[facility] ROUNDING NOTE   Subjective:   This is a 43 year old gentleman was admitted with pulmonary edema and acute hypoxic respiratory failure with aspiration and shock requiring a brief treatment with CRRT 12/01/2018 - 12/03/2018.  End-stage renal disease Tuesday Thursday Saturday dialysis received dialysis  12/05/2018 with ultrafiltration 2 L.  He has a history of failed renal transplant 10/2012 followed by [facility], not on any immunosuppressant drugs.  He has a history of hypertension diabetes mellitus type 2 and congestive heart failure with morbid obesity and obstructive sleep apnea on CPAP.  He also has a history of gastroesophageal reflux disease.  Patient confused and combative once off BiPAP and sedation  Blood pressure 131/87 pulse 87 temperature 98 O2 sats 93% high flow nasal cannula 6 L    Sodium 139 potassium 5.1 chloride 101 CO2 24 BUN 145 creatinine 11 glucose 128 calcium 8.9 last phosphorus 5.2   pro-stat 60 cc 3 times daily, insulin sliding scale  Cefepime 1 g every 24 hours  Feeding supplement vital AF 1 L at 40 cc an hour  Chest x-ray 12/06/2018 endotracheal tube and NG tube in stable position left IJ stable cardiomegaly  Ultrasound abdomen 12/02/2018 echogenicity of liver no other acute pathology unremarkable globe gallbladder with no evidence of dilation  2D echo EF 60 to 65% 12/01/2018   Objective:  Vital signs in last 24 hours:  Temp:  [98 F (36.7 C)-99.3 F (37.4 C)] 98 F (36.7 C) (06/30 0719) Pulse Rate:  [65-92] 69 (06/30 0800) Resp:  [16-32] 16 (06/30 0800) BP: (124-188)/(62-96) 142/87 (06/30 0800) SpO2:  [93 %-100 %] 96 % (06/30 0800) FiO2 (%):  [50 %] 50 % (06/30 0400) Weight:  [144 kg] 144 kg (06/30 0500)  Weight change: -4 kg Filed Weights   12/05/18 1115 12/06/18 0414 12/07/18 0500  Weight: (!) 146 kg (!) 143.9 kg (!) 144 kg    Intake/Output: I/O last 3 completed shifts: In: 2795.7 [I.V.:1665.7; NG/GT:930; IV Piggyback:200] Out:  -    Intake/Output this shift:  Total I/O In: 60.9 [I.V.:60.9] Out: -    Agitated CVS- RRR no JVP faint systolic murmur RS- CTA intubated and ventilator rhonchorous breath sounds ABD- BS present soft non-distended EXT-1+ peripheral edema   Basic Metabolic Panel: Recent Labs  Lab 11/30/18 1710  12/01/18 0413  12/01/18 1752  12/02/18 0404  12/02/18 1500 12/03/18 0404  12/04/18 9678 12/05/18 0526 12/06/18 0631 12/06/18 0708 12/06/18 0933 12/06/18 1420 12/07/18 0544  NA 139   < > 138   < > 138  --  138   < > 138 138   < > 141 139 139 138  --  137 139  K 4.6   < > 6.7*   < > 5.9*   < > 4.9   < > 4.5 4.5   < > 4.1 4.1 4.3 4.3  --  4.5 5.1  CL 100  --  98  --  96*  --  97*  --  97* 102  --  99 103 102  --   --   --  101  CO2 21*  --  20*  --  17*  --  20*  --  23 22  --  26 23 25   --   --   --  24  GLUCOSE 111*  --  89  --  164*  --  168*  --  140* 160*  --  133* 116* 136*  --   --   --  128*  BUN 74*  --  60*  --  62*  --  50*  --  48* 51*  --  95* 137* 112*  --   --   --  145*  CREATININE 17.47*  --  13.43*  --  13.96*  --  9.50*  --  7.93* 6.58*  --  8.38* 10.59* 9.15*  --   --   --  11.08*  CALCIUM 8.3*  --  8.2*  --  7.4*  --  7.5*  --  8.0* 8.4*  --  8.4* 8.6* 8.9  --   --   --  8.9  MG 2.4  --  2.4  --   --   --  2.3  --   --  2.5*  --   --   --   --   --   --   --   --   PHOS 8.2*  --  9.3*  --  9.1*  --  6.4*  --  5.1* 4.0  4.2  --   --   --   --   --  5.2*  --   --    < > = values in this interval not displayed.    Liver Function Tests: Recent Labs  Lab 11/30/18 1710  12/01/18 1250  12/02/18 0404 12/02/18 1500 12/03/18 0404 12/04/18 0639 12/06/18 0631  AST 12*  --  7,277*  --  7,556*  --   --  1,217* 348*  ALT 12  --  2,757*  --  3,595*  --   --  2,036* 1,167*  ALKPHOS 42  --  57  --  81  --   --  98 101  BILITOT 1.1  --  1.7*  --  1.7*  --   --  1.1 1.3*  PROT 6.9  --  6.7  --  7.0  --   --  6.5 6.8  ALBUMIN 2.8*   < > 2.7*   < > 2.8*  2.8* 2.7* 2.6*  2.5* 2.4*   < > = values in this interval not displayed.   No results for input(s): LIPASE, AMYLASE in the last 168 hours. No results for input(s): AMMONIA in the last 168 hours.  CBC: Recent Labs  Lab 12/03/18 0404  12/04/18 0923 12/05/18 0526 12/06/18 0631 12/06/18 0708 12/06/18 1420 12/07/18 0544  WBC 9.5  --  15.5* 16.0* 12.9*  --   --  11.2*  HGB 7.7*   < > 8.3* 7.7* 8.6* 9.5* 9.2* 8.2*  HCT 25.1*   < > 27.6* 25.3* 28.6* 28.0* 27.0* 26.7*  MCV 87.5  --  89.6 89.4 89.4  --   --  89.0  PLT 196  --  193 148* 171  --   --  162   < > = values in this interval not displayed.    Cardiac Enzymes: No results for input(s): CKTOTAL, CKMB, CKMBINDEX, TROPONINI in the last 168 hours.  BNP: Invalid input(s): POCBNP  CBG: Recent Labs  Lab 12/06/18 1540 12/06/18 2015 12/07/18 0026 12/07/18 0450 12/07/18 0718  GLUCAP 132* 124* 106* 111* 118*    Microbiology: Results for orders placed or performed during the hospital encounter of 11/30/18  SARS Coronavirus 2 (CEPHEID- Performed in [facility] hospital lab), [facility] Order     Status: None   Collection Time: 11/30/18  8:35 AM   Specimen: Nasopharyngeal Swab  Result Value Ref Range Status   SARS Coronavirus 2 NEGATIVE NEGATIVE  Final    Comment: (NOTE) If result is NEGATIVE SARS-CoV-2 target nucleic acids are NOT DETECTED. The SARS-CoV-2 RNA is generally detectable in upper and lower  respiratory specimens during the acute phase of infection. The lowest  concentration of SARS-CoV-2 viral copies this assay can detect is 250  copies / mL. A negative result does not preclude SARS-CoV-2 infection  and should not be used as the sole basis for treatment or other  patient management decisions.  A negative result may occur with  improper specimen collection / handling, submission of specimen other  than nasopharyngeal swab, presence of viral mutation(s) within the  areas targeted by this assay, and inadequate number of viral copies   (<250 copies / mL). A negative result must be combined with clinical  observations, patient history, and epidemiological information. If result is POSITIVE SARS-CoV-2 target nucleic acids are DETECTED. The SARS-CoV-2 RNA is generally detectable in upper and lower  respiratory specimens dur ing the acute phase of infection.  Positive  results are indicative of active infection with SARS-CoV-2.  Clinical  correlation with patient history and other diagnostic information is  necessary to determine patient infection status.  Positive results do  not rule out bacterial infection or co-infection with other viruses. If result is PRESUMPTIVE POSTIVE SARS-CoV-2 nucleic acids MAY BE PRESENT.   A presumptive positive result was obtained on the submitted specimen  and confirmed on repeat testing.  While 2019 novel coronavirus  (SARS-CoV-2) nucleic acids may be present in the submitted sample  additional confirmatory testing may be necessary for epidemiological  and / or clinical management purposes  to differentiate between  SARS-CoV-2 and other Sarbecovirus currently known to infect humans.  If clinically indicated additional testing with an alternate test  methodology [phone]) is advised. The SARS-CoV-2 RNA is generally  detectable in upper and lower respiratory sp ecimens during the acute  phase of infection. The expected result is Negative. Fact Sheet for Patients:  StrictlyIdeas.no Fact Sheet for Healthcare Providers: BankingDealers.co.za This test is not yet approved or cleared by the [place] FDA and has been authorized for detection and/or diagnosis of SARS-CoV-2 by FDA under an Emergency Use Authorization (EUA).  This EUA will remain in effect (meaning this test can be used) for the duration of the COVID-19 declaration under Section 564(b)(1) of the Act, 21 U.S.C. section 360bbb-3(b)(1), unless the authorization is terminated  or revoked sooner. Performed at [facility], [place] [address], [place], [place] [address]   Blood culture (routine x 2)     Status: None   Collection Time: 11/30/18  8:57 AM   Specimen: BLOOD RIGHT HAND  Result Value Ref Range Status   Specimen Description BLOOD RIGHT HAND  Final   Special Requests   Final    BOTTLES DRAWN AEROBIC AND ANAEROBIC Blood Culture adequate volume   Culture   Final    NO GROWTH 5 DAYS Performed at [facility], [place] [address], [place], [place] [address]    Report Status 12/05/2018 FINAL  Final  Culture, blood (routine x 2)     Status: None   Collection Time: 11/30/18  5:10 PM   Specimen: BLOOD LEFT HAND  Result Value Ref Range Status   Specimen Description BLOOD LEFT HAND  Final   Special Requests   Final    BOTTLES DRAWN AEROBIC ONLY Blood Culture results may not be optimal due to an inadequate volume of blood received in culture bottles   Culture  Final    NO GROWTH 5 DAYS Performed at [facility], [place] [address] [place], [place] [address]    Report Status 12/05/2018 FINAL  Final  MRSA PCR Screening     Status: None   Collection Time: 11/30/18  5:20 PM   Specimen: Nasal Mucosa; Nasopharyngeal  Result Value Ref Range Status   MRSA by PCR NEGATIVE NEGATIVE Final    Comment:        The GeneXpert MRSA Assay (FDA approved for NASAL specimens only), is one component of a comprehensive MRSA colonization surveillance program. It is not intended to diagnose MRSA infection nor to guide or monitor treatment for MRSA infections. Performed at [facility], [place] [address] [place], [place] [address]   Culture, respiratory (tracheal aspirate)     Status: None   Collection Time: 12/01/18  3:16 AM   Specimen: Tracheal Aspirate; Respiratory  Result Value Ref Range Status   Specimen Description TRACHEAL ASPIRATE  Final   Special Requests NONE  Final   Gram Stain   Final    RARE WBC PRESENT,BOTH PMN AND MONONUCLEAR NO ORGANISMS  SEEN    Culture   Final    NO GROWTH 2 DAYS Performed at [facility], [address], [place], [place] [address]    Report Status 12/03/2018 FINAL  Final    Coagulation Studies: No results for input(s): LABPROT, INR in the last 72 hours.  Urinalysis: No results for input(s): COLORURINE, LABSPEC, PHURINE, GLUCOSEU, HGBUR, BILIRUBINUR, KETONESUR, PROTEINUR, UROBILINOGEN, NITRITE, LEUKOCYTESUR in the last 72 hours.  Invalid input(s): APPERANCEUR    Imaging: Dg Chest Port 1 View  Result Date: 12/07/2018 CLINICAL DATA:  Respiratory failure. EXAM: PORTABLE CHEST 1 VIEW COMPARISON:  12/06/2018. FINDINGS: Interim extubation removal of NG tube. Left subclavian line again noted coursing up the left IJ. Cardiomegaly with mild pulmonary venous congestion again noted. Stable bibasilar atelectasis/infiltrates. No pleural effusion or pneumothorax. IMPRESSION: 1. Interim extubation removal of NG tube. Left subclavian line again noted coursing up the left IJ. 2. Cardiomegaly with mild pulmonary venous congestion again noted. No interim change. 3. Stable bibasilar atelectasis/infiltrates. No pleural effusion or pneumothorax noted. Electronically Signed   By: Marcello Moores  Register   On: 12/07/2018 06:37   Dg Chest Port 1 View  Result Date: 12/06/2018 CLINICAL DATA:  Acute respiratory failure. EXAM: PORTABLE CHEST 1 VIEW COMPARISON:  12/05/2018. FINDINGS: Endotracheal tube, NG tube noted stable position. Left subclavian line noted coursing up the left IJ. This is in unchanged position. Stable cardiomegaly. Stable bibasilar atelectasis. IMPRESSION: 1. Endotracheal tube and NG tube stable position. Left subclavian line courses up the left IJ in unchanged position. 2.  Stable cardiomegaly. 3.  Stable bibasilar atelectasis. Electronically Signed   By: Marcello Moores  Register   On: 12/06/2018 06:39     Medications:   . sodium chloride 10 mL/hr at 12/07/18 0900  . sodium chloride Stopped (12/06/18 1615)  .  dexmedetomidine (PRECEDEX) IV infusion 0.5 mcg/kg/hr (12/07/18 0900)   . B-complex with vitamin C  1 tablet Per Tube Daily  . chlorhexidine  15 mL Mouth Rinse BID  . Chlorhexidine Gluconate Cloth  6 each Topical Q0600  . darbepoetin (ARANESP) injection - DIALYSIS  100 mcg Intravenous Q Tue-HD  . heparin injection (subcutaneous)  5,000 Units Subcutaneous Q8H  . insulin aspart  0-9 Units Subcutaneous Q4H  . mouth rinse  15 mL Mouth Rinse q12n4p  . promethazine  12.5 mg Intravenous Once  . senna-docusate  1  tablet Per Tube Daily  . sucroferric oxyhydroxide  1,000 mg Oral TID WC   sodium chloride, acetaminophen **OR** acetaminophen, albuterol, bisacodyl, haloperidol lactate, heparin, hydrALAZINE, ondansetron **OR** ondansetron (ZOFRAN) IV  Assessment/ Plan:   ESRD-Tuesday Thursday Saturday dialysis.  Did receive dialysis Sunday due to high volume Saturday.  He had ultrafiltration of 2 L.  He received a brief period of CRRT on admission due to hypotension and shock.  Anemia appears stable last hemoglobin 9.5 we will check iron studies.  And add darbepoetin.  Bones will check phosphorus and PTH.  Does not appear to be on vitamin D analogs or binders at this time.  Hypotension/shock off pressors appears to have resolved  Ventilator dependent respiratory failure possible extubation defer to CCM service.  Admitted with volume overload pulmonary edema  Diastolic dysfunction stable  Diabetes mellitus insulin sliding scale  Malnutrition continues on Protostat feeding supplements.  Hyperkalemia dialysis planned 12/07/2018   LOS: [place] @TODAY @9 :17 AM

## 2018-12-07 NOTE — Evaluation (Signed)
Physical Therapy Evaluation Patient Details Name: [name] MRN: [id number] DOB: 23-Feb-1976 Today's Date: 12/07/2018   History of Present Illness  43 y.o. male admitted on 11/30/18 for SOB, COVID testing (-).  Pt dx with sepsis due to CAP, acute respiratory failure with hypoxia initally on BiPap, but ultimately intubated 11/30/18 and self extubated 12/06/18.  Pt normally on HD T, TH, Sat, but had to be on CRRT 6/24-6/26/20. Pt dx with ARDS, acute encepholopathy, septic shock due to aspiration,  RV dysfunction (related to ARDS), transaminitis, anemia of chronic disease, and elevated troponin (thought to be demand ischemia).  Pt with other significant PMH of DM, failed kidney transplant at [facility], PAF, morbid obesity, HTN, DM, CHF, sleep apnea.    Clinical Impression  Limited bed level evaluation complete today.  Limited by two factors: 1. Pt's cognition, 2. R femoral temporary HD line.  Pt is very confused and currently very weak, only following ~10% of basic commands, not vocalizing, and moving all 4 extremities spontaneously, yet very weakly against gravity.  He is a very sick mand and will likely have significant post acute rehab needs prior to returning home.   PT to follow acutely for deficits listed below.     Follow Up Recommendations CIR    Equipment Recommendations  Rolling walker with 5" wheels;3in1 (PT)    Recommendations for Other Services Rehab consult     Precautions / Restrictions Precautions Precautions: Fall;Other (comment) Precaution Comments: monitor vitals closely      Mobility  Bed Mobility               General bed mobility comments: NT, but pt attempting to roll to his side and got ~ 1/2 way over before collapsing back to his back due to weakness.   Transfers                 General transfer comment: NT as pt has temp femoral HD line in R groin               Pertinent Vitals/Pain Pain Assessment: No/denies pain    Home Living  Family/patient expects to be discharged to:: Private residence Living Arrangements: Alone               Additional Comments: Pt able to nod to some questions, but not clear if answers are accurate.  Not talking currently.     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 (from chart review)     Hand Dominance   Dominant Hand: Right    Extremity/Trunk Assessment   Upper Extremity Assessment Upper Extremity Assessment: Defer to OT evaluation    Lower Extremity Assessment Lower Extremity Assessment: Generalized weakness(unable to lift against gravity when asked)    Cervical / Trunk Assessment Cervical / Trunk Assessment: Normal  Communication   Communication: Expressive difficulties(not currently vocalizing.)  Cognition Arousal/Alertness: Lethargic Behavior During Therapy: Restless Overall Cognitive Status: Impaired/Different from baseline Area of Impairment: Orientation;Attention;Following commands;Safety/judgement;Awareness;Problem solving                 Orientation Level: Disoriented to;Place;Time;Situation Current Attention Level: Focused   Following Commands: Follows one step commands consistently;Follows one step commands with increased time(followed ~10% of commands) Safety/Judgement: Decreased awareness of safety;Decreased awareness of deficits Awareness: Intellectual Problem Solving: Slow processing;Decreased initiation;Difficulty sequencing;Requires verbal cues;Requires tactile cues General Comments: Pt attempting to follow  basic commands, but not consistant with with significant delay, at times spontaneously moving extremities (all 4) without awareness of edge of bed or safety/fall risk.  Lethargic, needing significant stiumulation for arousal throughtout.              Assessment/Plan    PT Assessment Patient needs continued PT services  PT Problem  List Decreased strength;Decreased activity tolerance;Decreased balance;Decreased mobility;Decreased cognition;Decreased knowledge of use of DME;Decreased safety awareness;Decreased knowledge of precautions;Cardiopulmonary status limiting activity;Obesity       PT Treatment Interventions DME instruction;Gait training;Stair training;Functional mobility training;Therapeutic activities;Therapeutic exercise;Balance training;Neuromuscular re-education;Cognitive remediation;Patient/family education;Wheelchair mobility training;Manual techniques    PT Goals (Current goals can be found in the Care Plan section)  Acute Rehab PT Goals Patient Stated Goal: unable to state PT Goal Formulation: Patient unable to participate in goal setting Time For Goal Achievement: 12/21/18 Potential to Achieve Goals: Good    Frequency Min 3X/week   Barriers to discharge Inaccessible home environment;Decreased caregiver support unsure of his help at discharge, he did indicate he had family nearby.       AM-PAC PT "6 Clicks" Mobility  Outcome Measure Help needed turning from your back to your side while in a flat bed without using bedrails?: Total Help needed moving from lying on your back to sitting on the side of a flat bed without using bedrails?: Total Help needed moving to and from a bed to a chair (including a wheelchair)?: Total Help needed standing up from a chair using your arms (e.g., wheelchair or bedside chair)?: Total Help needed to walk in hospital room?: Total Help needed climbing 3-5 steps with a railing? : Total 6 Click Score: 6    End of Session Equipment Utilized During Treatment: Oxygen(6L O2 [place]) Activity Tolerance: Patient limited by fatigue;Patient limited by lethargy Patient left: in bed;with call bell/phone within reach Nurse Communication: Mobility status PT Visit Diagnosis: Muscle weakness (generalized) (M62.81);Difficulty in walking, not elsewhere classified (R26.2);Other symptoms and  signs involving the nervous system (B63.893)    Time: 7342-8768 PT Time Calculation (min) (ACUTE ONLY): 10 min   Charges:        Wells Guiles B. Dicie Edelen, PT, DPT  Acute Rehabilitation [phone] pager [phone]) [phone] office  @ [facility]: [phone]   PT Evaluation $PT Eval Moderate Complexity: 1 Mod         12/07/2018, 12:03 PM

## 2018-12-08 LAB — GLUCOSE, CAPILLARY
Glucose-Capillary: 100 mg/dL — ABNORMAL HIGH (ref 70–99)
Glucose-Capillary: 102 mg/dL — ABNORMAL HIGH (ref 70–99)
Glucose-Capillary: 104 mg/dL — ABNORMAL HIGH (ref 70–99)
Glucose-Capillary: 113 mg/dL — ABNORMAL HIGH (ref 70–99)
Glucose-Capillary: 83 mg/dL (ref 70–99)
Glucose-Capillary: 93 mg/dL (ref 70–99)
Glucose-Capillary: 98 mg/dL (ref 70–99)

## 2018-12-08 LAB — CBC
HCT: 26.6 % — ABNORMAL LOW (ref 39.0–52.0)
Hemoglobin: 8.2 g/dL — ABNORMAL LOW (ref 13.0–17.0)
MCH: 27.7 pg (ref 26.0–34.0)
MCHC: 30.8 g/dL (ref 30.0–36.0)
MCV: 89.9 fL (ref 80.0–100.0)
Platelets: 179 10*3/uL (ref 150–400)
RBC: 2.96 MIL/uL — ABNORMAL LOW (ref 4.22–5.81)
RDW: 19 % — ABNORMAL HIGH (ref 11.5–15.5)
WBC: 9.8 10*3/uL (ref 4.0–10.5)
nRBC: 0.2 % (ref 0.0–0.2)

## 2018-12-08 LAB — PHOSPHORUS: Phosphorus: 6.3 mg/dL — ABNORMAL HIGH (ref 2.5–4.6)

## 2018-12-08 LAB — BASIC METABOLIC PANEL
Anion gap: 18 — ABNORMAL HIGH (ref 5–15)
BUN: 110 mg/dL — ABNORMAL HIGH (ref 6–20)
CO2: 22 mmol/L (ref 22–32)
Calcium: 9 mg/dL (ref 8.9–10.3)
Chloride: 99 mmol/L (ref 98–111)
Creatinine, Ser: 9.41 mg/dL — ABNORMAL HIGH (ref 0.61–1.24)
GFR calc Af Amer: 7 mL/min — ABNORMAL LOW (ref >60)
GFR calc non Af Amer: 6 mL/min — ABNORMAL LOW (ref >60)
Glucose, Bld: 98 mg/dL (ref 70–99)
Potassium: 4.1 mmol/L (ref 3.5–5.1)
Sodium: 139 mmol/L (ref 135–145)

## 2018-12-08 LAB — MAGNESIUM: Magnesium: 2.7 mg/dL — ABNORMAL HIGH (ref 1.7–2.4)

## 2018-12-08 MED ORDER — SODIUM CHLORIDE 0.9 % IV SOLN
125.0000 mg | INTRAVENOUS | Status: DC
  Administered 2018-12-09 – 2018-12-11 (×2): 125 mg via INTRAVENOUS
  Filled 2018-12-08 (×3): qty 10

## 2018-12-08 MED ORDER — CHLORHEXIDINE GLUCONATE CLOTH 2 % EX PADS
6.0000 | MEDICATED_PAD | Freq: Every day | CUTANEOUS | Status: DC
  Administered 2018-12-10 – 2018-12-12 (×2): 6 via TOPICAL

## 2018-12-08 NOTE — Progress Notes (Signed)
Rehab Admissions Coordinator Note:  Per OT documentation, pt able to participate more today in therapy.  At this time, we are recommending Inpatient Rehab consult.  Please place IP Rehab MD consult order.   Michel Santee 12/08/2018, 12:52 PM  I can be reached at [phone].

## 2018-12-08 NOTE — Evaluation (Signed)
Clinical/Bedside Swallow Evaluation Patient Details  Name: [name] MRN: [id number] Date of Birth: [birth year]  Today's Date: 12/08/2018 Time: SLP Start Time (ACUTE ONLY): 0930 SLP Stop Time (ACUTE ONLY): 1008 SLP Time Calculation (min) (ACUTE ONLY): 38 min  Past Medical History:  Past Medical History:  Diagnosis Date  . Anemia    ESRD  . CHF (congestive heart failure) ([place])   . Diabetes mellitus without complication ([place])   . ESRD on hemodialysis ([place]) 06/07/2011   TTS [facility]. Started HD in 2006, got transplant in 2014 lasted until Dec 2019 then went back on HD.  Has L thigh AVG as of Jun 2020.  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   Marland Kitchen GERD (gastroesophageal reflux disease)   . Hypertension 111/29/2012   pt states he takes no HTN meds  . Hypothyroidism, secondary   . Morbid obesity ([place])   . Paroxysmal atrial fibrillation ([facility])   . Sleep apnea 06/07/2011   Pt had sllep study done 2 weeks ago- Dr. Joellyn Quails.  Does not  have a CPAP at this time.   Past Surgical History:  Past Surgical History:  Procedure Laterality Date  . ARTERIOVENOUS GRAFT PLACEMENT  08/09/10   Left Thigh Graft by Dr. [name]  . AV FISTULA PLACEMENT Right 05/18/2018   Procedure: INSERTION OF ARTERIOVENOUS (AV) GORE-TEX GRAFT Left THIGH;  Surgeon: Waynetta Sandy, MD;  Location: [facility];  Service: Vascular;  Laterality: Right;  . CAPD REMOVAL  05/08/2011   Procedure: CONTINUOUS AMBULATORY PERITONEAL DIALYSIS  (CAPD) CATHETER REMOVAL;  Surgeon: [name], MD;  Location: [facility] OR;  Service: General;  Laterality: N/A;  Removal of CAPD catheter, Dr. requests to go after 100  . INSERTION OF DIALYSIS CATHETER  10/05/10   Right Femoral Cath insertion by Dr. [name].  Pt ahas had several caths inserted.  . IR FLUORO GUIDE CV LINE RIGHT  05/11/2018  . IR US GUIDE VASC ACCESS RIGHT  05/11/2018  . KIDNEY TRANSPLANT  2014  . UPPER EXTREMITY ANGIOGRAPHY Bilateral 05/13/2018   Procedure: UPPER EXTREMITY ANGIOGRAPHY - bilarteral;  Surgeon: Marty Heck, MD;  Location: [place] CV [facility];  Service: Cardiovascular;  Laterality: Bilateral;   HPI:  43 y.o. male admitted on 11/30/18 for SOB, COVID testing (-).  Pt dx with sepsis due to CAP, acute respiratory failure with hypoxia initally on BiPap, but ultimately intubated 11/30/18 and self extubated 12/06/18.  Pt normally on HD T, TH, Sat, but had to be on CRRT 6/24-6/26/20. Pt dx with ARDS, acute encepholopathy, septic shock due to aspiration,  RV dysfunction (related to ARDS), transaminitis, anemia of chronic disease, and elevated troponin (thought to be demand ischemia).  Pt with other significant PMH of DM, failed kidney transplant at [facility], PAF, morbid obesity, HTN, DM, CHF, sleep apnea.     Assessment / Plan / Recommendation Clinical Impression  Pt participated in clinical swallow evaluation in conjunction with OT to optimize positioning and participation.  Pt completed oral care seated at EOB prior to administration of POs.  He presented with functional swallow s/p six-day intubation.  Voice/cough are stronger today per chart review; pt with improved mentation, following commands, not impulsive.  Demonstrated adequate oral attention, mastication; brisk swallow response.  Consumed three oz water without concern for aspiration, then mixed consistencies of regular solids/liquids with adequate airway protection.  Recommend initiating a regular diet, thin liquids; meds whole in liquid.  No SLP f/u is needed with regard to  swallowing.   SLP Visit Diagnosis: Dysphagia, unspecified (R13.10)    Aspiration Risk  No limitations    Diet Recommendation   carb modified; thin liquids  Medication Administration: Whole meds with liquid    Other  Recommendations Oral Care Recommendations: Oral care BID   Follow up Recommendations None      Frequency and Duration            Prognosis        Swallow Study   General  Date of Onset: 11/30/18 HPI: 43 y.o. male admitted on 11/30/18 for SOB, COVID testing (-).  Pt dx with sepsis due to CAP, acute respiratory failure with hypoxia initally on BiPap, but ultimately intubated 11/30/18 and self extubated 12/06/18.  Pt normally on HD T, TH, Sat, but had to be on CRRT 6/24-6/26/20. Pt dx with ARDS, acute encepholopathy, septic shock due to aspiration,  RV dysfunction (related to ARDS), transaminitis, anemia of chronic disease, and elevated troponin (thought to be demand ischemia).  Pt with other significant PMH of DM, failed kidney transplant at [facility], PAF, morbid obesity, HTN, DM, CHF, sleep apnea.   Type of Study: Bedside Swallow Evaluation Previous Swallow Assessment: no Diet Prior to this Study: NPO Temperature Spikes Noted: No Respiratory Status: Nasal cannula History of Recent Intubation: Yes Length of Intubations (days): 6 days Date extubated: 12/06/18 Behavior/Cognition: Alert;Pleasant mood Oral Cavity Assessment: Within Functional Limits Oral Care Completed by SLP: Other (Comment)(completed by patient) Oral Cavity - Dentition: Adequate natural dentition Vision: Functional for self-feeding Self-Feeding Abilities: Needs assist Patient Positioning: Other (comment)(sitting EOB)    Oral/Motor/Sensory Function Overall Oral Motor/Sensory Function: Within functional limits   Ice Chips Ice chips: Within functional limits   Thin Liquid Thin Liquid: Within functional limits    Nectar Thick Nectar Thick Liquid: Not tested   Honey Thick Honey Thick Liquid: Not tested   Puree Puree: Within functional limits   Solid     Solid: Within functional limits      [name] [name] 12/08/2018,10:26 AM   [name] [name], [name] Office number [phone] Pager [phone]

## 2018-12-08 NOTE — Progress Notes (Signed)
[facility] ROUNDING NOTE   Subjective:   This is a 43 year old gentleman was admitted with pulmonary edema and acute hypoxic respiratory failure with aspiration and shock requiring a brief treatment with CRRT 12/01/2018 - 12/03/2018.  End-stage renal disease Tuesday Thursday Saturday dialysis received dialysis  12/05/2018 with ultrafiltration 2 L.  He has a history of failed renal transplant 10/2012 followed by [facility], not on any immunosuppressant drugs.  He has a history of hypertension diabetes mellitus type 2 and congestive heart failure with morbid obesity and obstructive sleep apnea on CPAP.  He also has a history of gastroesophageal reflux disease.  Patient appears to be much better he is calm.  Blood pressure 146/89 pulse 95 temperature 98.2 O2 sats 100% high flow nasal cannula 6 L   Sodium 139 potassium 4.1 chloride 99 CO2 22 BUN 100 creatinine 9.4 glucose 98 calcium 9.0 phosphorus 6.3 magnesium 2.7 WBC 9.8 hemoglobin 8.2 platelets 179   pro-stat 60 cc 3 times daily, insulin sliding scale, Velphoro 1 g 3 times daily with meals, darbepoetin 100 mcg q. Tuesday    Feeding supplement vital AF 1 L at 40 cc an hour  Chest x-ray 12/06/2018 endotracheal tube and NG tube in stable position left IJ stable cardiomegaly  Ultrasound abdomen 12/02/2018 echogenicity of liver no other acute pathology unremarkable globe gallbladder with no evidence of dilation  2D echo EF 60 to 65% 12/01/2018   Objective:  Vital signs in last 24 hours:  Temp:  [97.4 F (36.3 C)-98.5 F (36.9 C)] 98.2 F (36.8 C) (07/01 0719) Pulse Rate:  [68-96] 95 (07/01 0600) Resp:  [16-35] 34 (07/01 0600) BP: (125-149)/(60-94) 146/89 (07/01 0600) SpO2:  [91 %-100 %] 95 % (07/01 0600) FiO2 (%):  [50 %] 50 % (06/30 1246) Weight:  [140.2 kg-144.2 kg] 140.2 kg (07/01 0500)  Weight change: 0.2 kg Filed Weights   12/07/18 1215 12/07/18 1525 12/08/18 0500  Weight: (!) 144.2 kg (!) 141.7 kg (!) 140.2 kg     Intake/Output: I/O last 3 completed shifts: In: 1110.2 [I.V.:1010.2; IV Piggyback:100] Out: 2500 [Other:2500]   Intake/Output this shift:  No intake/output data recorded.   Agitated CVS- RRR no JVP faint systolic murmur RS- CTA intubated and ventilator rhonchorous breath sounds ABD- BS present soft non-distended EXT-1+ peripheral edema   Basic Metabolic Panel: Recent Labs  Lab 12/02/18 0404  12/02/18 1500 12/03/18 0404  12/04/18 4008 12/05/18 0526 12/06/18 0631 12/06/18 0708 12/06/18 0933 12/06/18 1420 12/07/18 0544 12/08/18 0313  NA 138   < > 138 138   < > 141 139 139 138  --  137 139 139  K 4.9   < > 4.5 4.5   < > 4.1 4.1 4.3 4.3  --  4.5 5.1 4.1  CL 97*  --  97* 102  --  99 103 102  --   --   --  101 99  CO2 20*  --  23 22  --  26 23 25   --   --   --  24 22  GLUCOSE 168*  --  140* 160*  --  133* 116* 136*  --   --   --  128* 98  BUN 50*  --  48* 51*  --  95* 137* 112*  --   --   --  145* 110*  CREATININE 9.50*  --  7.93* 6.58*  --  8.38* 10.59* 9.15*  --   --   --  11.08* 9.41*  CALCIUM 7.5*  --  8.0* 8.4*  --  8.4* 8.6* 8.9  --   --   --  8.9 9.0  MG 2.3  --   --  2.5*  --   --   --   --   --   --   --   --  2.7*  PHOS 6.4*  --  5.1* 4.0  4.2  --   --   --   --   --  5.2*  --   --  6.3*   < > = values in this interval not displayed.    Liver Function Tests: Recent Labs  Lab 12/01/18 1250  12/02/18 0404 12/02/18 1500 12/03/18 0404 12/04/18 0639 12/06/18 0631  AST 7,277*  --  7,556*  --   --  1,217* 348*  ALT 2,757*  --  3,595*  --   --  2,036* 1,167*  ALKPHOS 57  --  81  --   --  98 101  BILITOT 1.7*  --  1.7*  --   --  1.1 1.3*  PROT 6.7  --  7.0  --   --  6.5 6.8  ALBUMIN 2.7*   < > 2.8*  2.8* 2.7* 2.6* 2.5* 2.4*   < > = values in this interval not displayed.   No results for input(s): LIPASE, AMYLASE in the last 168 hours. No results for input(s): AMMONIA in the last 168 hours.  CBC: Recent Labs  Lab 12/04/18 0639 12/05/18 0526  12/06/18 0631 12/06/18 0708 12/06/18 1420 12/07/18 0544 12/08/18 0313  WBC 15.5* 16.0* 12.9*  --   --  11.2* 9.8  HGB 8.3* 7.7* 8.6* 9.5* 9.2* 8.2* 8.2*  HCT 27.6* 25.3* 28.6* 28.0* 27.0* 26.7* 26.6*  MCV 89.6 89.4 89.4  --   --  89.0 89.9  PLT 193 148* 171  --   --  162 179    Cardiac Enzymes: No results for input(s): CKTOTAL, CKMB, CKMBINDEX, TROPONINI in the last 168 hours.  BNP: Invalid input(s): POCBNP  CBG: Recent Labs  Lab 12/07/18 1522 12/07/18 1933 12/07/18 2346 12/08/18 0356 12/08/18 0711  GLUCAP 102* 104* 96 102* 93    Microbiology: Results for orders placed or performed during the hospital encounter of 11/30/18  SARS Coronavirus 2 (CEPHEID- Performed in [facility] hospital lab), Hosp Order     Status: None   Collection Time: 11/30/18  8:35 AM   Specimen: Nasopharyngeal Swab  Result Value Ref Range Status   SARS Coronavirus 2 NEGATIVE NEGATIVE Final    Comment: (NOTE) If result is NEGATIVE SARS-CoV-2 target nucleic acids are NOT DETECTED. The SARS-CoV-2 RNA is generally detectable in upper and lower  respiratory specimens during the acute phase of infection. The lowest  concentration of SARS-CoV-2 viral copies this assay can detect is 250  copies / mL. A negative result does not preclude SARS-CoV-2 infection  and should not be used as the sole basis for treatment or other  patient management decisions.  A negative result may occur with  improper specimen collection / handling, submission of specimen other  than nasopharyngeal swab, presence of viral mutation(s) within the  areas targeted by this assay, and inadequate number of viral copies  (<250 copies / mL). A negative result must be combined with clinical  observations, patient history, and epidemiological information. If result is POSITIVE SARS-CoV-2 target nucleic acids are DETECTED. The SARS-CoV-2 RNA is generally detectable in upper and lower  respiratory specimens dur ing the acute phase of  infection.  Positive  results are indicative of active infection with SARS-CoV-2.  Clinical  correlation with patient history and other diagnostic information is  necessary to determine patient infection status.  Positive results do  not rule out bacterial infection or co-infection with other viruses. If result is PRESUMPTIVE POSTIVE SARS-CoV-2 nucleic acids MAY BE PRESENT.   A presumptive positive result was obtained on the submitted specimen  and confirmed on repeat testing.  While 2019 novel coronavirus  (SARS-CoV-2) nucleic acids may be present in the submitted sample  additional confirmatory testing may be necessary for epidemiological  and / or clinical management purposes  to differentiate between  SARS-CoV-2 and other Sarbecovirus currently known to infect humans.  If clinically indicated additional testing with an alternate test  methodology [id number]) is advised. The SARS-CoV-2 RNA is generally  detectable in upper and lower respiratory sp ecimens during the acute  phase of infection. The expected result is Negative. Fact Sheet for Patients:  StrictlyIdeas.no Fact Sheet for Healthcare Providers: BankingDealers.co.za This test is not yet approved or cleared by the [place] FDA and has been authorized for detection and/or diagnosis of SARS-CoV-2 by FDA under an Emergency Use Authorization (EUA).  This EUA will remain in effect (meaning this test can be used) for the duration of the COVID-19 declaration under Section 564(b)(1) of the Act, 21 U.S.C. section 360bbb-3(b)(1), unless the authorization is terminated or revoked sooner. Performed at [facility], [place] [address], [place], [place] [address]   Blood culture (routine x 2)     Status: None   Collection Time: 11/30/18  8:57 AM   Specimen: BLOOD RIGHT HAND  Result Value Ref Range Status   Specimen Description BLOOD RIGHT HAND  Final   Special Requests   Final     BOTTLES DRAWN AEROBIC AND ANAEROBIC Blood Culture adequate volume   Culture   Final    NO GROWTH 5 DAYS Performed at [facility], [place] [address], [place], [place] [address]    Report Status 12/05/2018 FINAL  Final  Culture, blood (routine x 2)     Status: None   Collection Time: 11/30/18  5:10 PM   Specimen: BLOOD LEFT HAND  Result Value Ref Range Status   Specimen Description BLOOD LEFT HAND  Final   Special Requests   Final    BOTTLES DRAWN AEROBIC ONLY Blood Culture results may not be optimal due to an inadequate volume of blood received in culture bottles   Culture   Final    NO GROWTH 5 DAYS Performed at [facility], [place] [address], [place], [place] [address]    Report Status 12/05/2018 FINAL  Final  MRSA PCR Screening     Status: None   Collection Time: 11/30/18  5:20 PM   Specimen: Nasal Mucosa; Nasopharyngeal  Result Value Ref Range Status   MRSA by PCR NEGATIVE NEGATIVE Final    Comment:        The GeneXpert MRSA Assay (FDA approved for NASAL specimens only), is one component of a comprehensive MRSA colonization surveillance program. It is not intended to diagnose MRSA infection nor to guide or monitor treatment for MRSA infections. Performed at [facility], [place] [address] [place], [place] [address]   Culture, respiratory (tracheal aspirate)     Status: None   Collection Time: 12/01/18  3:16 AM   Specimen: Tracheal Aspirate; Respiratory  Result Value Ref Range Status   Specimen Description TRACHEAL ASPIRATE  Final  Special Requests NONE  Final   Gram Stain   Final    RARE WBC PRESENT,BOTH PMN AND MONONUCLEAR NO ORGANISMS SEEN    Culture   Final    NO GROWTH 2 DAYS Performed at [facility], [address], [place], [place] [address]    Report Status 12/03/2018 FINAL  Final    Coagulation Studies: No results for input(s): LABPROT, INR in the last 72 hours.  Urinalysis: No results for input(s): COLORURINE, LABSPEC, PHURINE,  GLUCOSEU, HGBUR, BILIRUBINUR, KETONESUR, PROTEINUR, UROBILINOGEN, NITRITE, LEUKOCYTESUR in the last 72 hours.  Invalid input(s): APPERANCEUR    Imaging: Dg Chest Port 1 View  Result Date: 12/07/2018 CLINICAL DATA:  Respiratory failure. EXAM: PORTABLE CHEST 1 VIEW COMPARISON:  12/06/2018. FINDINGS: Interim extubation removal of NG tube. Left subclavian line again noted coursing up the left IJ. Cardiomegaly with mild pulmonary venous congestion again noted. Stable bibasilar atelectasis/infiltrates. No pleural effusion or pneumothorax. IMPRESSION: 1. Interim extubation removal of NG tube. Left subclavian line again noted coursing up the left IJ. 2. Cardiomegaly with mild pulmonary venous congestion again noted. No interim change. 3. Stable bibasilar atelectasis/infiltrates. No pleural effusion or pneumothorax noted. Electronically Signed   By: Marcello Moores  Register   On: 12/07/2018 06:37     Medications:   . sodium chloride 10 mL/hr at 12/08/18 0600  . sodium chloride 10 mL/hr at 12/07/18 0938  . dexmedetomidine (PRECEDEX) IV infusion Stopped (12/07/18 1238)   . B-complex with vitamin C  1 tablet Per Tube Daily  . Chlorhexidine Gluconate Cloth  6 each Topical Q0600  . darbepoetin (ARANESP) injection - DIALYSIS  100 mcg Intravenous Q Tue-HD  . heparin injection (subcutaneous)  5,000 Units Subcutaneous Q8H  . insulin aspart  0-9 Units Subcutaneous Q4H  . mouth rinse  15 mL Mouth Rinse BID  . senna-docusate  1 tablet Per Tube Daily  . sucroferric oxyhydroxide  1,000 mg Oral TID WC   sodium chloride, acetaminophen **OR** acetaminophen, albuterol, bisacodyl, haloperidol lactate, hydrALAZINE, ondansetron **OR** ondansetron (ZOFRAN) IV  Assessment/ Plan:   ESRD-Tuesday Thursday Saturday dialysis.  Did receive dialysis Sunday due to high volume Saturday.  Ultrafiltration 2.5 L 12/07/2018 he received a brief period of CRRT on admission due to hypotension and shock.  Anemia appears stable last  hemoglobin 9.5 we will check iron studies.  And add darbepoetin, T sats 19% we will treat with some IV iron..  Bones last PTH 12/06/2018 was 136 continues on Velphoro 1 g 3 times daily with meals  Hypotension/shock off pressors appears to have resolved removal of 2.5 L  Ventilator dependent respiratory failure possible extubation defer to CCM service.  Admitted with volume overload pulmonary edema  Diastolic dysfunction stable  Diabetes mellitus insulin sliding scale  Malnutrition continues on Protostat feeding supplements.  Dialysis planned 12/09/2018   LOS: [place] @TODAY @7 :42 AM

## 2018-12-08 NOTE — Progress Notes (Signed)
Bipap has PRN order.  Not needed at this time.

## 2018-12-08 NOTE — Evaluation (Signed)
Occupational Therapy Evaluation Patient Details Name: [name] MRN: [id number] DOB: 12/09/1975 Today's Date: 12/08/2018    History of Present Illness 43 y.o. male admitted on 11/30/18 for SOB, COVID testing (-).  Pt dx with sepsis due to CAP, acute respiratory failure with hypoxia initally on BiPap, but ultimately intubated 11/30/18 and self extubated 12/06/18.  Pt normally on HD T, TH, Sat, but had to be on CRRT 6/24-6/26/20. Pt dx with ARDS, acute encepholopathy, septic shock due to aspiration,  RV dysfunction (related to ARDS), transaminitis, anemia of chronic disease, and elevated troponin (thought to be demand ischemia).  Pt with other significant PMH of DM, failed kidney transplant at [facility], PAF, morbid obesity, HTN, DM, CHF, sleep apnea.     Clinical Impression   PT admitted with sepsis due to CAP and respiratory failure. Pt currently with functional limitiations due to the deficits listed below (see OT problem list). Pt currently requires MAx (A) bed mobility to roll L side and able to squat from EOB total +2 Max elevated surface. Pt engaged in all therapy session and feel that CIR could progress him to independent level of care in 14 days.  Pt will benefit from skilled OT to increase their independence and safety with adls and balance to allow discharge CIR.     Follow Up Recommendations  CIR    Equipment Recommendations  3 in 1 bedside commode(bariatric)    Recommendations for Other Services Rehab consult     Precautions / Restrictions Precautions Precautions: Fall;Other (comment) Precaution Comments: monitor vitals closely Restrictions Weight Bearing Restrictions: No      Mobility Bed Mobility Overal bed mobility: Needs Assistance Bed Mobility: Rolling Rolling: Max assist         General bed mobility comments: pt rolling to the R mod (A) and max (A) to the left. pt able to exit the bed on Right side with mod (A) and pulling on rail with L  UE  Transfers Overall transfer level: Needs assistance   Transfers: Sit to/from Stand Sit to Stand: +2 physical assistance;Max assist;From elevated surface         General transfer comment: pt able to clear buttock x2 but unable to sustain     Balance Overall balance assessment: Mild deficits observed, not formally tested                                         ADL either performed or assessed with clinical judgement   ADL Overall ADL's : Needs assistance/impaired Eating/Feeding: Minimal assistance;Sitting Eating/Feeding Details (indicate cue type and reason): requires use of L hand to self feed. requires (A) to open containers and maintain grasp on containers Grooming: Oral care;Sitting Grooming Details (indicate cue type and reason): sitting eob unsupported with L lateral lean spilling and decr lip closure  Upper Body Bathing: Maximal assistance   Lower Body Bathing: Total assistance   Upper Body Dressing : Maximal assistance   Lower Body Dressing: Total assistance Lower Body Dressing Details (indicate cue type and reason): pt does attempt to lift leg to help don socks   Toilet Transfer Details (indicate cue type and reason): completes bed pan use rolling tot he Right side. pt voiding appropraite iwth bed pan use Toileting- Clothing Manipulation and Hygiene: Total assistance         General ADL Comments: pt remained EOB for >10 minutes min guard (A). pt  with decr rolling toward the L side iwth bed mobility,      Vision Baseline Vision/History: Wears glasses       Perception     Praxis      Pertinent Vitals/Pain Pain Assessment: Faces Faces Pain Scale: Hurts whole lot Pain Location: R wrist Pain Descriptors / Indicators: Grimacing;Guarding;Sore Pain Intervention(s): Monitored during session;Repositioned     Hand Dominance Right   Extremity/Trunk Assessment Upper Extremity Assessment Upper Extremity Assessment: RUE deficits/detail RUE  Deficits / Details: decr shoulder flexion < 50 degrees, pain with wrist flexion/ extension, edema at digits. pt noted to have mittens in room from previous use   Lower Extremity Assessment Lower Extremity Assessment: Defer to PT evaluation   Cervical / Trunk Assessment Cervical / Trunk Assessment: Normal   Communication Communication Communication: Other (comment)(soft speech but able to speak)   Cognition Arousal/Alertness: Awake/alert Behavior During Therapy: Restless Overall Cognitive Status: Impaired/Different from baseline Area of Impairment: Awareness                           Awareness: Emergent Problem Solving: Slow processing General Comments: RN reports that patient has require reminders all morning to remain in bed.    General Comments  sacral rednes due to prolonged positionign and voiding of diarrhea. stool this session very dark. 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         OT Problem List: Decreased strength;Decreased range of motion;Decreased activity tolerance;Impaired balance (sitting and/or standing);Decreased coordination;Decreased cognition;Decreased safety awareness;Decreased knowledge of precautions;Decreased knowledge of use of DME or AE;Cardiopulmonary status limiting activity;Obesity;Impaired UE functional use;Pain;Increased edema      OT Treatment/Interventions: Self-care/ADL training;Neuromuscular education;Therapeutic exercise;Energy conservation;DME and/or AE instruction;Manual therapy;Modalities;Therapeutic activities;Cognitive remediation/compensation;Patient/family  education;Balance training    OT Goals(Current goals can be found in the care plan section) Acute Rehab OT Goals Patient Stated Goal: ginger ale to drink OT Goal Formulation: With patient Time For Goal Achievement: 12/22/18 Potential to Achieve Goals: Good  OT Frequency: Min 3X/week   Barriers to D/C:            Co-evaluation   Reason for Co-Treatment: Complexity of the patient's impairments (multi-system involvement);For patient/therapist safety     SLP goals addressed during session: Swallowing    AM-PAC OT "6 Clicks" Daily Activity     Outcome Measure Help from another person eating meals?: A Little Help from another person taking care of personal grooming?: A Little Help from another person toileting, which includes using toliet, bedpan, or urinal?: A Lot Help from another person bathing (including washing, rinsing, drying)?: A Lot Help from another person to put on and taking off regular upper body clothing?: A Lot Help from another person to put on and taking off regular lower body clothing?: Total 6 Click Score: 13   End of Session Equipment Utilized During Treatment: Gait belt;Oxygen Nurse Communication: Mobility status;Precautions  Activity Tolerance:  Patient tolerated treatment well Patient left: in bed;with call bell/phone within reach;with bed alarm set  OT Visit Diagnosis: Unsteadiness on feet (R26.81);Muscle weakness (generalized) (M62.81)                Time: 8421-0312 OT Time Calculation (min): 40 min Charges:  OT General Charges $OT Visit: 1 Visit OT Evaluation $OT Eval Moderate Complexity: 1 Mod OT Treatments $Self Care/Home Management : 8-22 mins   Jeri Modena, OTR/L  Acute Rehabilitation Services Pager: [phone] Office: [phone] .   Jeri Modena 12/08/2018, 11:46 AM

## 2018-12-08 NOTE — Progress Notes (Addendum)
NAME:  [name], MRN:  [id number], DOB:  1975-10-14, LOS: 8 ADMISSION DATE:  11/30/2018, CONSULTATION DATE:  11/30/18 REFERRING MD:  Wilson Singer  CHIEF COMPLAINT:  SOB   Brief History   [name] is a 43 y.o. male who presented to [facility] ED 6/23 with respiratory distress and hypoxic respiratory failure presumed due to acute pulmonary edema after missing HD. Also possible HCAP. Required intubation in ED due to extremis. Developed ARDS and florid septic shock requiring epinephrine drip and multiorgan failure  Past Medical History  OSA on CPAP, PAF, HTN, combined CHF (echo from 2019 with EF 55-60%, G2DD), ESRD on HD s/p renal transplant 2014, DM, hypothyroidism.  Significant Hospital Events   6/23 > admit. 6/24> worsening shock, CRRT initiated 6/25> shock improving however troponins continue to climb, LFTs remain elevated. Continues on CRRt. CXr with some improvement  6/26 >Off Epi gtt and continuing to wean NE. Vaso remains at consistent dose  6/28 > Concerns for cuff leak overnight, changed to SIMV pressure settings, leak resolved after pushing ET tube to 25 cm at lip 6/29 > Self-Extubated  7/1> will receive iHD. Will dc CVC. HDS   Consults:  Nephrology. Cardiology Vascular Surgery   Procedures:  ETT 6/23 >6/29 L [place] CVC 6/23>   R fem trialysis catheter 6/24>6/30  Significant Diagnostic Tests:  ECHO 6/24> LVEF 55-60%, RV D-shaped interventricular septum suggests pressure/volume overload. 6/25 RUQ [place] > neg  Micro Data:  Blood 6/23 > no growth  Sputum 6/23 > no organisms seen  COVID 6/23 > negative.  Antimicrobials:  Vanc 6/23 > 6/26 Cefepime 6/23 > 6/29  Interim history/subjective:  NAEO Improving mental status.  HDS   Objective:  Blood pressure (!) 146/89, pulse 95, temperature 98.2 F (36.8 C), temperature source Oral, resp. rate (!) 34, height 6\' 2"  (1.88 m), weight (!) 140.2 kg, SpO2 95 %.    FiO2 (%):  [50 %] 50 %   Intake/Output Summary (Last 24 hours)  at 12/08/2018 0853 Last data filed at 12/08/2018 0600 Gross per 24 hour  Intake 304.17 ml  Output 2500 ml  Net -2195.83 ml   Filed Weights   12/07/18 1215 12/07/18 1525 12/08/18 0500  Weight: (!) 144.2 kg (!) 141.7 kg (!) 140.2 kg    Examination:  General: Obese adult male, reclined in bed, NAD on 4LNC  Neuro: AAOx2, CAM ICU +, following commands. PERRL  HEENT: NCAT pink mmm patent nares with [place] in place, trachea midline  Cardiovascular: RRR s1s2 no rgm Capillary refill < 3 seconds BUE BLE  Lungs: CTA bilaterally. No accessory muscle recruitment on 4LNC, symmetrical chest expansion  Abdomen: Obese, soft, round ndnt, hypoactive x4  Musculoskeletal: Symmetrical bulk and tone. No obvious joint deformity. No cyanosis no clubbing  Skin: clean dry warm, without rash   Assessment & Plan:   ARDS, pulmonary edema in setting of missed iHD- improved Concern for aspiration, s/p abx  Plan -Wean supplemental O2 for SpO2 > 92 -Currently weaned from [facility] to [facility]  Acute encephalopathy (toxic metabolic, improving  ICU Delirium, improving  Acute  Plan -All sedation dc -RN delirium precautions  -Anticipate ongoing improvement of toxic metabolic encephalopathy with iHD   ESRD, on OP HD.  -failed renal transplant  -Transient CRRT now off  Plan - Nephrology following appreciate iHD management - IV iron with dialysis per nephrology   Septic shock, improved - Tracheal aspirate 6/24 reveals no organisms  - Bcx 6/24 no growth  - SARS CoV2  negative  Plan - s/p abx course - Continue to trend WBC, fever curve - Discontinue L subclavian CVC today (7/1)  RV dysfunction -likely related to ARDS but presumed pre-existing PH -revealed on ECHO 6/24, suggestive of volume overload  Plan Will need repeat echo after acute issues resolved  Transaminitis, improving  -likely shock liver in setting of above, possible contributing factor acute RV dysfunction causing hepatic congestion -improved Plan  -Check LFTs 7/2 AM to ensure continuing to down-trend   HTN Plan -PRN hydralazine  -Continue tele  -Plan to add PO medications when no longer NPO   Anemia of chronic disease. Plan - Transfuse for Hgb < 7. - IV iron, aranesp per nephrology   DM Plan -Trend glucose  -SSI   Constipation, improving -in setting of opioids, decreased mobility, critical illness Plan  PRN suppository   Inadequate PO intake P NPO at this time SLP swallow study as patient self-extubated Advance diet following swallow study   Deconditioning -in setting of critical illness P PT/OT evaluation  Likely will need Inpatient Rehab   Best Practice:  Diet: NPO  DVT prophylaxis: Heparin sq GI prophylaxis: Pepcid  Glucose control: SSI Mobility: PT/OT Code Status: Full. Family Communication: pending 7/1 Disposition: Patient is HDS and acute ICU needs have improved. Will plan to transfer out of unit with [facility] to take over care and PCCM to sign off 7/2  Robert Gum MSN, AGACNP-BC [place] [phone] If no answer, [phone] 12/08/2018, 8:53 AM  Attending Note:  43 year old male with PMH of ESRD on HD who presented after missing dialysis presenting with respiratory failure from pulmonary edema.  No events overnight.  On exam, clear lungs, patient alert and interactive, moving all ext to command.  I reviewed CXR myself, pulmonary edema noted.  Discussed with PCCM-NP.  Acute respiratory failure:  - Extubated  - Titrate O2 for sat of 88-92%  - Ambulate  Acute encephalopathy:  - D/C all anti-psychotics  - Minimize sedation  Acute pulmonary edema:  - HD in AM  - Minimize fluid intake as able  Non-compliance:  - Stressed need for following dialysis regiment  Transfer to progressive care and to [facility] with PCCM off 7/2  Patient seen and examined, agree with above note.  I dictated the care and orders written for this patient under my direction.  Rush Farmer,  [place]

## 2018-12-08 NOTE — Progress Notes (Signed)
PCCM Communication Note  Attempted to reach the patient's father, [name], to provide daily updates but was directed to voicemail.    Eliseo Gum MSN, AGACNP-BC [place] [phone] If no answer, [phone] 12/08/2018, 12:17 PM

## 2018-12-09 DIAGNOSIS — J9602 Acute respiratory failure with hypercapnia: Secondary | ICD-10-CM

## 2018-12-09 LAB — GLUCOSE, CAPILLARY
Glucose-Capillary: 96 mg/dL (ref 70–99)
Glucose-Capillary: 101 mg/dL — ABNORMAL HIGH (ref 70–99)
Glucose-Capillary: 111 mg/dL — ABNORMAL HIGH (ref 70–99)
Glucose-Capillary: 96 mg/dL (ref 70–99)
Glucose-Capillary: 107 mg/dL — ABNORMAL HIGH (ref 70–99)
Glucose-Capillary: 101 mg/dL — ABNORMAL HIGH (ref 70–99)

## 2018-12-09 LAB — RENAL FUNCTION PANEL
Albumin: 2.7 g/dL — ABNORMAL LOW (ref 3.5–5.0)
Anion gap: 17 — ABNORMAL HIGH (ref 5–15)
BUN: 128 mg/dL — ABNORMAL HIGH (ref 6–20)
CO2: 21 mmol/L — ABNORMAL LOW (ref 22–32)
Calcium: 8.9 mg/dL (ref 8.9–10.3)
Chloride: 98 mmol/L (ref 98–111)
Creatinine, Ser: 12.05 mg/dL — ABNORMAL HIGH (ref 0.61–1.24)
GFR calc Af Amer: 5 mL/min — ABNORMAL LOW (ref >60)
GFR calc non Af Amer: 4 mL/min — ABNORMAL LOW (ref >60)
Glucose, Bld: 113 mg/dL — ABNORMAL HIGH (ref 70–99)
Phosphorus: 7.3 mg/dL — ABNORMAL HIGH (ref 2.5–4.6)
Potassium: 4.3 mmol/L (ref 3.5–5.1)
Sodium: 136 mmol/L (ref 135–145)

## 2018-12-09 LAB — CBC
HCT: 26.8 % — ABNORMAL LOW (ref 39.0–52.0)
Hemoglobin: 8.4 g/dL — ABNORMAL LOW (ref 13.0–17.0)
MCH: 27.5 pg (ref 26.0–34.0)
MCHC: 31.3 g/dL (ref 30.0–36.0)
MCV: 87.6 fL (ref 80.0–100.0)
Platelets: 230 10*3/uL (ref 150–400)
RBC: 3.06 MIL/uL — ABNORMAL LOW (ref 4.22–5.81)
RDW: 19.9 % — ABNORMAL HIGH (ref 11.5–15.5)
WBC: 10.8 10*3/uL — ABNORMAL HIGH (ref 4.0–10.5)
nRBC: 0.4 % — ABNORMAL HIGH (ref 0.0–0.2)

## 2018-12-09 LAB — PHOSPHORUS: Phosphorus: 7.4 mg/dL — ABNORMAL HIGH (ref 2.5–4.6)

## 2018-12-09 LAB — MAGNESIUM: Magnesium: 3.1 mg/dL — ABNORMAL HIGH (ref 1.7–2.4)

## 2018-12-09 MED ORDER — LABETALOL HCL 5 MG/ML IV SOLN
10.0000 mg | Freq: Four times a day (QID) | INTRAVENOUS | Status: DC | PRN
  Administered 2018-12-09: 10 mg via INTRAVENOUS
  Filled 2018-12-09: qty 4

## 2018-12-09 MED ORDER — LIDOCAINE-PRILOCAINE 2.5-2.5 % EX CREA: 1.0000 "application " | TOPICAL_CREAM | CUTANEOUS | Status: DC | PRN

## 2018-12-09 MED ORDER — PENTAFLUOROPROP-TETRAFLUOROETH EX AERO: 1.0000 "application " | INHALATION_SPRAY | CUTANEOUS | Status: DC | PRN

## 2018-12-09 MED ORDER — SODIUM CHLORIDE 0.9 % IV SOLN: 100.0000 mL | INTRAVENOUS | Status: DC | PRN

## 2018-12-09 MED ORDER — ALTEPLASE 2 MG IJ SOLR: 2.0000 mg | Freq: Once | INTRAMUSCULAR | Status: DC | PRN

## 2018-12-09 MED ORDER — PRO-STAT SUGAR FREE PO LIQD
30.0000 mL | Freq: Two times a day (BID) | ORAL | Status: DC
  Administered 2018-12-09 – 2018-12-13 (×8): 30 mL via ORAL
  Filled 2018-12-09 (×8): qty 30

## 2018-12-09 MED ORDER — HEPARIN SODIUM (PORCINE) 1000 UNIT/ML DIALYSIS: 1000.0000 [IU] | INTRAMUSCULAR | Status: DC | PRN

## 2018-12-09 MED ORDER — LIDOCAINE HCL (PF) 1 % IJ SOLN: 5.0000 mL | INTRAMUSCULAR | Status: DC | PRN

## 2018-12-09 NOTE — Progress Notes (Signed)
Nutrition Follow-up  DOCUMENTATION CODES:   Morbid obesity  INTERVENTION:   -Continue Prostat liquid protein PO 30 ml BID with meals, each supplement provides 100 kcal, 15 grams protein. -Recommend Rena-vit in place of B complex vitamin  NUTRITION DIAGNOSIS:   Inadequate oral intake related to acute illness as evidenced by NPO status.  Now on CHO modified diet.  GOAL:   Patient will meet greater than or equal to 90% of their needs  Progressing.  MONITOR:   Labs, Weight trends, TF tolerance, Skin  ASSESSMENT:   43 yo male admitted with hypoxic respiratory failure with pulmonary edema, ARDS, possible pneumonia requiring intubation and multiorgan failure after missed HD, in severe shock on unknown etiology, elevated LFTs with shock liver. PMH includes ESRD on HD, hx of kidney transplant, HTN, DM, CHF, GERD 6/23 Admit, intubated 6/24 CRRT initiated  **RD working remotely**  Patient was extubated 6/29. Pt now on CHO modified diet since 7/1, was NPO prior to that. No PO intakes have been documented since diet advanced. Will continue Prostat supplements orally. Patient currently receiving HD today.  EDW per outpatient records is 146 kg. Pt now weighs less than this, at 141 kg. Per I/Os: +118 ml since admit.  Medications: B complex w/ Vitamin C tablet daily Labs reviewed: CBGs: 101-111 Phos: 7.3  Diet Order:   Diet Order            Diet Carb Modified Fluid consistency: Thin; Room service appropriate? Yes  Diet effective now              EDUCATION NEEDS:   Not appropriate for education at this time  Skin:  Skin Assessment: Reviewed RN Assessment  Last BM:  7/1  Height:   Ht Readings from Last 1 Encounters:  12/01/18 6\' 2"  (1.88 m)    Weight:   Wt Readings from Last 1 Encounters:  12/09/18 (!) 141.7 kg    Ideal Body Weight:  86.3 kg  BMI:  Body mass index is 40.11 kg/m.  Estimated Nutritional Needs:   Kcal:  2800-3000  Protein:   140-150g  Fluid:  1L + UOP  Clayton Bibles, MS, RD, LDN [place] Dietitian Pager: [phone] After Hours Pager: [phone]

## 2018-12-09 NOTE — Progress Notes (Signed)
[facility] ROUNDING NOTE   Subjective:   This is a 43 year old gentleman was admitted with pulmonary edema and acute hypoxic respiratory failure with aspiration and shock requiring a brief treatment with CRRT 12/01/2018 - 12/03/2018.  End-stage renal disease Tuesday Thursday Saturday dialysis received dialysis 12/07/2018 with ultrafiltration 2.5 L.  He has a history of failed renal transplant 10/2012 followed by [facility], not on any immunosuppressant drugs.  He has a history of hypertension diabetes mellitus type 2 and congestive heart failure with morbid obesity and obstructive sleep apnea on CPAP.  He also has a history of gastroesophageal reflux disease.  Patient appears to be much better he is calm.  Currently on BiPAP  Blood pressure 190/96 pulse 102 temperature 97.7 O2 sats 96% high flow nasal cannula 2 L  No labs at this time    pro-stat 60 cc 3 times daily, insulin sliding scale, Velphoro 1 g 3 times daily with meals, darbepoetin 100 mcg q. Tuesday  Feeding supplement vital AF 1 L at 40 cc an hour  Chest x-ray 12/06/2018 endotracheal tube and NG tube in stable position left IJ stable cardiomegaly  Ultrasound abdomen 12/02/2018 echogenicity of liver no other acute pathology unremarkable globe gallbladder with no evidence of dilation  2D echo EF 60 to 65% 12/01/2018   Objective:  Vital signs in last 24 hours:  Temp:  [97.7 F (36.5 C)-98.5 F (36.9 C)] 97.7 F (36.5 C) (07/02 0803) Pulse Rate:  [90-102] 102 (07/02 0805) Resp:  [0-34] 25 (07/02 0803) BP: (128-190)/(72-108) 190/96 (07/02 0805) SpO2:  [96 %-100 %] 96 % (07/02 0805) Weight:  [140.9 kg] 140.9 kg (07/02 0500)  Weight change: -3.3 kg Filed Weights   12/07/18 1525 12/08/18 0500 12/09/18 0500  Weight: (!) 141.7 kg (!) 140.2 kg (!) 140.9 kg    Intake/Output: I/O last 3 completed shifts: In: 119.9 [I.V.:119.9] Out: -    Intake/Output this shift:  No intake/output data recorded.   Agitated CVS- RRR no  JVP faint systolic murmur RS-some fine inspiratory crackles heard at the bases ABD- BS present soft non-distended EXT-1+ peripheral edema   Basic Metabolic Panel: Recent Labs  Lab 12/02/18 1500 12/03/18 0404  12/04/18 0639 12/05/18 0526 12/06/18 0631 12/06/18 0708 12/06/18 0933 12/06/18 1420 12/07/18 0544 12/08/18 0313  NA 138 138   < > 141 139 139 138  --  137 139 139  K 4.5 4.5   < > 4.1 4.1 4.3 4.3  --  4.5 5.1 4.1  CL 97* 102  --  99 103 102  --   --   --  101 99  CO2 23 22  --  26 23 25   --   --   --  24 22  GLUCOSE 140* 160*  --  133* 116* 136*  --   --   --  128* 98  BUN 48* 51*  --  95* 137* 112*  --   --   --  145* 110*  CREATININE 7.93* 6.58*  --  8.38* 10.59* 9.15*  --   --   --  11.08* 9.41*  CALCIUM 8.0* 8.4*  --  8.4* 8.6* 8.9  --   --   --  8.9 9.0  MG  --  2.5*  --   --   --   --   --   --   --   --  2.7*  PHOS 5.1* 4.0  4.2  --   --   --   --   --  5.2*  --   --  6.3*   < > = values in this interval not displayed.    Liver Function Tests: Recent Labs  Lab 12/02/18 1500 12/03/18 0404 12/04/18 0639 12/06/18 0631  AST  --   --  1,217* 348*  ALT  --   --  2,036* 1,167*  ALKPHOS  --   --  98 101  BILITOT  --   --  1.1 1.3*  PROT  --   --  6.5 6.8  ALBUMIN 2.7* 2.6* 2.5* 2.4*   No results for input(s): LIPASE, AMYLASE in the last 168 hours. No results for input(s): AMMONIA in the last 168 hours.  CBC: Recent Labs  Lab 12/04/18 0639 12/05/18 0526 12/06/18 0631 12/06/18 0708 12/06/18 1420 12/07/18 0544 12/08/18 0313  WBC 15.5* 16.0* 12.9*  --   --  11.2* 9.8  HGB 8.3* 7.7* 8.6* 9.5* 9.2* 8.2* 8.2*  HCT 27.6* 25.3* 28.6* 28.0* 27.0* 26.7* 26.6*  MCV 89.6 89.4 89.4  --   --  89.0 89.9  PLT 193 148* 171  --   --  162 179    Cardiac Enzymes: No results for input(s): CKTOTAL, CKMB, CKMBINDEX, TROPONINI in the last 168 hours.  BNP: Invalid input(s): POCBNP  CBG: Recent Labs  Lab 12/08/18 1644 12/08/18 1957 12/08/18 2332 12/09/18 0453  12/09/18 0731  GLUCAP 100* 104* 113* 96 101*    Microbiology: Results for orders placed or performed during the hospital encounter of 11/30/18  SARS Coronavirus 2 (CEPHEID- Performed in [facility] hospital lab), Hosp Order     Status: None   Collection Time: 11/30/18  8:35 AM   Specimen: Nasopharyngeal Swab  Result Value Ref Range Status   SARS Coronavirus 2 NEGATIVE NEGATIVE Final    Comment: (NOTE) If result is NEGATIVE SARS-CoV-2 target nucleic acids are NOT DETECTED. The SARS-CoV-2 RNA is generally detectable in upper and lower  respiratory specimens during the acute phase of infection. The lowest  concentration of SARS-CoV-2 viral copies this assay can detect is 250  copies / mL. A negative result does not preclude SARS-CoV-2 infection  and should not be used as the sole basis for treatment or other  patient management decisions.  A negative result may occur with  improper specimen collection / handling, submission of specimen other  than nasopharyngeal swab, presence of viral mutation(s) within the  areas targeted by this assay, and inadequate number of viral copies  (<250 copies / mL). A negative result must be combined with clinical  observations, patient history, and epidemiological information. If result is POSITIVE SARS-CoV-2 target nucleic acids are DETECTED. The SARS-CoV-2 RNA is generally detectable in upper and lower  respiratory specimens dur ing the acute phase of infection.  Positive  results are indicative of active infection with SARS-CoV-2.  Clinical  correlation with patient history and other diagnostic information is  necessary to determine patient infection status.  Positive results do  not rule out bacterial infection or co-infection with other viruses. If result is PRESUMPTIVE POSTIVE SARS-CoV-2 nucleic acids MAY BE PRESENT.   A presumptive positive result was obtained on the submitted specimen  and confirmed on repeat testing.  While 2019 novel  coronavirus  (SARS-CoV-2) nucleic acids may be present in the submitted sample  additional confirmatory testing may be necessary for epidemiological  and / or clinical management purposes  to differentiate between  SARS-CoV-2 and other Sarbecovirus currently known to infect humans.  If clinically indicated additional testing with an alternate  test  methodology [phone]) is advised. The SARS-CoV-2 RNA is generally  detectable in upper and lower respiratory sp ecimens during the acute  phase of infection. The expected result is Negative. Fact Sheet for Patients:  StrictlyIdeas.no Fact Sheet for Healthcare Providers: BankingDealers.co.za This test is not yet approved or cleared by the [place] FDA and has been authorized for detection and/or diagnosis of SARS-CoV-2 by FDA under an Emergency Use Authorization (EUA).  This EUA will remain in effect (meaning this test can be used) for the duration of the COVID-19 declaration under Section 564(b)(1) of the Act, 21 U.S.C. section 360bbb-3(b)(1), unless the authorization is terminated or revoked sooner. Performed at [facility], [place] [address] [place], [place] [address]   Blood culture (routine x 2)     Status: None   Collection Time: 11/30/18  8:57 AM   Specimen: BLOOD RIGHT HAND  Result Value Ref Range Status   Specimen Description BLOOD RIGHT HAND  Final   Special Requests   Final    BOTTLES DRAWN AEROBIC AND ANAEROBIC Blood Culture adequate volume   Culture   Final    NO GROWTH 5 DAYS Performed at [facility], [place] [address] [place], [place] [address]    Report Status 12/05/2018 FINAL  Final  Culture, blood (routine x 2)     Status: None   Collection Time: 11/30/18  5:10 PM   Specimen: BLOOD LEFT HAND  Result Value Ref Range Status   Specimen Description BLOOD LEFT HAND  Final   Special Requests   Final    BOTTLES DRAWN AEROBIC ONLY Blood Culture results may not  be optimal due to an inadequate volume of blood received in culture bottles   Culture   Final    NO GROWTH 5 DAYS Performed at [facility], [place] [address] [place], [place] [address]    Report Status 12/05/2018 FINAL  Final  MRSA PCR Screening     Status: None   Collection Time: 11/30/18  5:20 PM   Specimen: Nasal Mucosa; Nasopharyngeal  Result Value Ref Range Status   MRSA by PCR NEGATIVE NEGATIVE Final    Comment:        The GeneXpert MRSA Assay (FDA approved for NASAL specimens only), is one component of a comprehensive MRSA colonization surveillance program. It is not intended to diagnose MRSA infection nor to guide or monitor treatment for MRSA infections. Performed at [facility], [place] [address] [place], [place] [address]   Culture, respiratory (tracheal aspirate)     Status: None   Collection Time: 12/01/18  3:16 AM   Specimen: Tracheal Aspirate; Respiratory  Result Value Ref Range Status   Specimen Description TRACHEAL ASPIRATE  Final   Special Requests NONE  Final   Gram Stain   Final    RARE WBC PRESENT,BOTH PMN AND MONONUCLEAR NO ORGANISMS SEEN    Culture   Final    NO GROWTH 2 DAYS Performed at [facility], [address] [place], [place] [address]    Report Status 12/03/2018 FINAL  Final    Coagulation Studies: No results for input(s): LABPROT, INR in the last 72 hours.  Urinalysis: No results for input(s): COLORURINE, LABSPEC, PHURINE, GLUCOSEU, HGBUR, BILIRUBINUR, KETONESUR, PROTEINUR, UROBILINOGEN, NITRITE, LEUKOCYTESUR in the last 72 hours.  Invalid input(s): APPERANCEUR    Imaging: No results found.   Medications:   . sodium chloride 10 mL/hr at 12/08/18 0600  . sodium chloride 10 mL/hr  at 12/07/18 8115  . ferric gluconate (FERRLECIT/NULECIT) IV     . B-complex with vitamin C  1 tablet Per Tube Daily  . Chlorhexidine Gluconate Cloth  6 each Topical Q0600  . Chlorhexidine Gluconate Cloth  6 each Topical Q0600  .  darbepoetin (ARANESP) injection - DIALYSIS  100 mcg Intravenous Q Tue-HD  . heparin injection (subcutaneous)  5,000 Units Subcutaneous Q8H  . insulin aspart  0-9 Units Subcutaneous Q4H  . mouth rinse  15 mL Mouth Rinse BID  . senna-docusate  1 tablet Per Tube Daily  . sucroferric oxyhydroxide  1,000 mg Oral TID WC   sodium chloride, acetaminophen **OR** acetaminophen, albuterol, bisacodyl, haloperidol lactate, hydrALAZINE, ondansetron **OR** ondansetron (ZOFRAN) IV  Assessment/ Plan:   ESRD-Tuesday Thursday Saturday dialysis.  Did receive dialysis Sunday due to high volume Saturday.  he received a brief period of CRRT on admission due to hypotension and shock.  Scheduled dialysis 12/09/2018  Anemia appears stable last hemoglobin 9.5 we will check iron studies.  And add darbepoetin, T sats 19% we will treat with some IV iron..  Bones last PTH 12/06/2018 was 136 continues on Velphoro 1 g 3 times daily with meals  Hypotension/shock off pressors appears to have resolved removal of 2.5 L  Ventilator dependent respiratory failure possible extubation defer to CCM service.  Admitted with volume overload pulmonary edema  Diastolic dysfunction stable  Diabetes mellitus insulin sliding scale  Malnutrition continues on Protostat feeding supplements.  Dialysis planned 12/09/2018   LOS: Mona @TODAY @8 :39 AM

## 2018-12-09 NOTE — Progress Notes (Signed)
PROGRESS NOTE  [name] QHU:[id number] DOB: 1976-03-13 DOA: 11/30/2018 PCP: Luetta Nutting, DO  HPI/Recap of past 24 hours: [name] is a 43 y.o. male  with past medical history significant for OSA on CPAP, paroxysmal A. fib, hypertension, chronic diastolic CHF with EF 55 to 60% with grade 2 diastolic dysfunction, ESRD on HD Tuesday Thursday and Saturday, failed renal transplant in 2014, type 2 diabetes, hypothyroidism who presented to [facility] ED 11/30/18 with respiratory distress and hypoxic respiratory failure presumed due to acute pulmonary edema after missing HD. Also possible HCAP. Required intubation in ED due to extremis. Developed ARDS and florid septic shock requiring epinephrine drip and multiorgan failure.  Transferred to [facility] service on 12/09/2018.  12/09/18: Patient seen and examined at his bedside.  He is somnolent but easily arousable to voices.  He denies chest pain or palpitations.  He is on oxygen by nasal cannula wean down to 2 L and saturating greater than 93%.  States he uses CPAP at home.  Plan for hemodialysis today.  Assessment/Plan: Principal Problem:   Sepsis ([place]) Active Problems:   Essential hypertension   Acute respiratory failure with hypoxia (HCC)   Acute encephalopathy   Septic shock (HCC)  ARDS, pulmonary edema in the setting of missed hemodialysis Self extubated on 12/06/2018\ Currently on 2 L oxygen by nasal cannula and saturating greater than 93% Independently reviewed chest x-ray done at admission which showed cardiomegaly with increase in pulmonary vascularity suggestive of pulmonary edema Volume status will be addressed with hemodialysis Plan hemodialysis today 12/09/2018 On renal diet with fluid restriction Continue fluid restriction  ESRD on HD Tuesday Thursday Saturday Nephrology following Plan for HD today  Anemia of chronic disease Hemoglobin stable at 8.2 No sign of overt bleeding Aranesp per Nephrology  Resolved acute metabolic  encephalopathy Continue to hold off on antipsychotics Reorient as needed Minimize sedation  History of noncompliance Education on the importance of compliance with hemodialysis  Morbid obesity BMI 40 Recommend weight loss outpatient with healthy dieting and regular physical activity.    OSA Continue CPAP at night  Physical debility/ambulatory dysfunction PT OT to assess CIR consult for possible placement   Code Status: Full code  Family Communication: None at bedside  Disposition Plan: Possible discharge home in 1 to 2 days when nephrology signs off and patient is hemodynamically stable.   Consultants:  Nephrology  Procedures:  Hemodialysis  Antimicrobials:  None  DVT prophylaxis: Subcu heparin 3 times daily   Objective: Vitals:   12/09/18 0805 12/09/18 1110 12/09/18 1115 12/09/18 1120  BP: (!) 190/96 (!) 188/101 (!) 183/103 (!) 177/113  Pulse: (!) 102 (!) 108 (!) 108 (!) 107  Resp:  (!) 36 (!) 26 (!) 29  Temp:  98.6 F (37 C)    TempSrc:  Oral    SpO2: 96% 97%    Weight:  (!) 141.7 kg    Height:       No intake or output data in the 24 hours ending 12/09/18 1138 Filed Weights   12/08/18 0500 12/09/18 0500 12/09/18 1110  Weight: (!) 140.2 kg (!) 140.9 kg (!) 141.7 kg    Exam:  . General: 43 y.o. year-old male morbidly obese in no acute distress.  Somnolent but easily arousable to voices. . Cardiovascular: Regular rate and rhythm with no rubs or gallops.  No thyromegaly or JVD noted.   Marland Kitchen Respiratory: Clear to auscultation with no wheezes or rales. Good inspiratory effort. . Abdomen: Soft nontender nondistended with  normal bowel sounds x4 quadrants. . Musculoskeletal: No lower extremity edema. 2/4 pulses in all 4 extremities. Marland Kitchen Psychiatry: Mood is appropriate for condition and setting   Data Reviewed: CBC: Recent Labs  Lab 12/04/18 0639 12/05/18 0526 12/06/18 0631 12/06/18 0708 12/06/18 1420 12/07/18 0544 12/08/18 0313  WBC 15.5* 16.0*  12.9*  --   --  11.2* 9.8  HGB 8.3* 7.7* 8.6* 9.5* 9.2* 8.2* 8.2*  HCT 27.6* 25.3* 28.6* 28.0* 27.0* 26.7* 26.6*  MCV 89.6 89.4 89.4  --   --  89.0 89.9  PLT 193 148* 171  --   --  162 818   Basic Metabolic Panel: Recent Labs  Lab 12/02/18 1500 12/03/18 0404  12/04/18 0639 12/05/18 0526 12/06/18 0631 12/06/18 0708 12/06/18 0933 12/06/18 1420 12/07/18 0544 12/08/18 0313  NA 138 138   < > 141 139 139 138  --  137 139 139  K 4.5 4.5   < > 4.1 4.1 4.3 4.3  --  4.5 5.1 4.1  CL 97* 102  --  99 103 102  --   --   --  101 99  CO2 23 22  --  26 23 25   --   --   --  24 22  GLUCOSE 140* 160*  --  133* 116* 136*  --   --   --  128* 98  BUN 48* 51*  --  95* 137* 112*  --   --   --  145* 110*  CREATININE 7.93* 6.58*  --  8.38* 10.59* 9.15*  --   --   --  11.08* 9.41*  CALCIUM 8.0* 8.4*  --  8.4* 8.6* 8.9  --   --   --  8.9 9.0  MG  --  2.5*  --   --   --   --   --   --   --   --  2.7*  PHOS 5.1* 4.0  4.2  --   --   --   --   --  5.2*  --   --  6.3*   < > = values in this interval not displayed.   GFR: Estimated Creatinine Clearance: 15 mL/min (A) (by C-G formula based on SCr of 9.41 mg/dL (H)). Liver Function Tests: Recent Labs  Lab 12/02/18 1500 12/03/18 0404 12/04/18 0639 12/06/18 0631  AST  --   --  1,217* 348*  ALT  --   --  2,036* 1,167*  ALKPHOS  --   --  98 101  BILITOT  --   --  1.1 1.3*  PROT  --   --  6.5 6.8  ALBUMIN 2.7* 2.6* 2.5* 2.4*   No results for input(s): LIPASE, AMYLASE in the last 168 hours. No results for input(s): AMMONIA in the last 168 hours. Coagulation Profile: No results for input(s): INR, PROTIME in the last 168 hours. Cardiac Enzymes: No results for input(s): CKTOTAL, CKMB, CKMBINDEX, TROPONINI in the last 168 hours. BNP (last 3 results) No results for input(s): PROBNP in the last 8760 hours. HbA1C: No results for input(s): HGBA1C in the last 72 hours. CBG: Recent Labs  Lab 12/08/18 1957 12/08/18 2332 12/09/18 0453 12/09/18 0731 12/09/18  1045  GLUCAP 104* 113* 96 101* 111*   Lipid Profile: No results for input(s): CHOL, HDL, LDLCALC, TRIG, CHOLHDL, LDLDIRECT in the last 72 hours. Thyroid Function Tests: No results for input(s): TSH, T4TOTAL, FREET4, T3FREE, THYROIDAB in the last 72 hours. Anemia Panel: No results for  input(s): VITAMINB12, FOLATE, FERRITIN, TIBC, IRON, RETICCTPCT in the last 72 hours. Urine analysis:    Component Value Date/Time   COLORURINE STRAW (A) 05/10/2018 1811   APPEARANCEUR CLEAR 05/10/2018 1811   LABSPEC 1.012 05/10/2018 1811   PHURINE 7.0 05/10/2018 1811   GLUCOSEU 150 (A) 05/10/2018 1811   HGBUR SMALL (A) 05/10/2018 1811   BILIRUBINUR NEGATIVE 11/03/2018 1515   KETONESUR NEGATIVE 05/10/2018 1811   PROTEINUR Positive (A) 11/03/2018 1515   PROTEINUR >=300 (A) 05/10/2018 1811   UROBILINOGEN 0.2 11/03/2018 1515   NITRITE NEGATIVE 11/03/2018 1515   NITRITE NEGATIVE 05/10/2018 1811   LEUKOCYTESUR Small (1+) (A) 11/03/2018 1515   Sepsis Labs: @LABRCNTIP (procalcitonin:4,lacticidven:4)  ) Recent Results (from the past 240 hour(s))  SARS Coronavirus 2 (CEPHEID- Performed in [facility] hospital lab), Hosp Order     Status: None   Collection Time: 11/30/18  8:35 AM   Specimen: Nasopharyngeal Swab  Result Value Ref Range Status   SARS Coronavirus 2 NEGATIVE NEGATIVE Final    Comment: (NOTE) If result is NEGATIVE SARS-CoV-2 target nucleic acids are NOT DETECTED. The SARS-CoV-2 RNA is generally detectable in upper and lower  respiratory specimens during the acute phase of infection. The lowest  concentration of SARS-CoV-2 viral copies this assay can detect is 250  copies / mL. A negative result does not preclude SARS-CoV-2 infection  and should not be used as the sole basis for treatment or other  patient management decisions.  A negative result may occur with  improper specimen collection / handling, submission of specimen other  than nasopharyngeal swab, presence of viral mutation(s)  within the  areas targeted by this assay, and inadequate number of viral copies  (<250 copies / mL). A negative result must be combined with clinical  observations, patient history, and epidemiological information. If result is POSITIVE SARS-CoV-2 target nucleic acids are DETECTED. The SARS-CoV-2 RNA is generally detectable in upper and lower  respiratory specimens dur ing the acute phase of infection.  Positive  results are indicative of active infection with SARS-CoV-2.  Clinical  correlation with patient history and other diagnostic information is  necessary to determine patient infection status.  Positive results do  not rule out bacterial infection or co-infection with other viruses. If result is PRESUMPTIVE POSTIVE SARS-CoV-2 nucleic acids MAY BE PRESENT.   A presumptive positive result was obtained on the submitted specimen  and confirmed on repeat testing.  While 2019 novel coronavirus  (SARS-CoV-2) nucleic acids may be present in the submitted sample  additional confirmatory testing may be necessary for epidemiological  and / or clinical management purposes  to differentiate between  SARS-CoV-2 and other Sarbecovirus currently known to infect humans.  If clinically indicated additional testing with an alternate test  methodology [phone]) is advised. The SARS-CoV-2 RNA is generally  detectable in upper and lower respiratory sp ecimens during the acute  phase of infection. The expected result is Negative. Fact Sheet for Patients:  StrictlyIdeas.no Fact Sheet for Healthcare Providers: BankingDealers.co.za This test is not yet approved or cleared by the [place] FDA and has been authorized for detection and/or diagnosis of SARS-CoV-2 by FDA under an Emergency Use Authorization (EUA).  This EUA will remain in effect (meaning this test can be used) for the duration of the COVID-19 declaration under Section 564(b)(1) of the Act,  21 U.S.C. section 360bbb-3(b)(1), unless the authorization is terminated or revoked sooner. Performed at [facility], [place] [address], [place], [place] [address]   Blood culture (  routine x 2)     Status: None   Collection Time: 11/30/18  8:57 AM   Specimen: BLOOD RIGHT HAND  Result Value Ref Range Status   Specimen Description BLOOD RIGHT HAND  Final   Special Requests   Final    BOTTLES DRAWN AEROBIC AND ANAEROBIC Blood Culture adequate volume   Culture   Final    NO GROWTH 5 DAYS Performed at [facility], [address], [place], [place] [address]    Report Status 12/05/2018 FINAL  Final  Culture, blood (routine x 2)     Status: None   Collection Time: 11/30/18  5:10 PM   Specimen: BLOOD LEFT HAND  Result Value Ref Range Status   Specimen Description BLOOD LEFT HAND  Final   Special Requests   Final    BOTTLES DRAWN AEROBIC ONLY Blood Culture results may not be optimal due to an inadequate volume of blood received in culture bottles   Culture   Final    NO GROWTH 5 DAYS Performed at [facility], [place] [address], [place], [place] [address]    Report Status 12/05/2018 FINAL  Final  MRSA PCR Screening     Status: None   Collection Time: 11/30/18  5:20 PM   Specimen: Nasal Mucosa; Nasopharyngeal  Result Value Ref Range Status   MRSA by PCR NEGATIVE NEGATIVE Final    Comment:        The GeneXpert MRSA Assay (FDA approved for NASAL specimens only), is one component of a comprehensive MRSA colonization surveillance program. It is not intended to diagnose MRSA infection nor to guide or monitor treatment for MRSA infections. Performed at [facility], [place] [address], [place], [place] [address]   Culture, respiratory (tracheal aspirate)     Status: None   Collection Time: 12/01/18  3:16 AM   Specimen: Tracheal Aspirate; Respiratory  Result Value Ref Range Status   Specimen Description TRACHEAL ASPIRATE  Final   Special Requests NONE  Final   Gram  Stain   Final    RARE WBC PRESENT,BOTH PMN AND MONONUCLEAR NO ORGANISMS SEEN    Culture   Final    NO GROWTH 2 DAYS Performed at [facility], [address], [place],  [address]    Report Status 12/03/2018 FINAL  Final      Studies: No results found.  Scheduled Meds: . B-complex with vitamin C  1 tablet Per Tube Daily  . Chlorhexidine Gluconate Cloth  6 each Topical Q0600  . Chlorhexidine Gluconate Cloth  6 each Topical Q0600  . darbepoetin (ARANESP) injection - DIALYSIS  100 mcg Intravenous Q Tue-HD  . heparin injection (subcutaneous)  5,000 Units Subcutaneous Q8H  . insulin aspart  0-9 Units Subcutaneous Q4H  . mouth rinse  15 mL Mouth Rinse BID  . senna-docusate  1 tablet Per Tube Daily  . sucroferric oxyhydroxide  1,000 mg Oral TID WC    Continuous Infusions: . sodium chloride 10 mL/hr at 12/08/18 0600  . sodium chloride 10 mL/hr at 12/07/18 0938  . sodium chloride    . sodium chloride    . ferric gluconate (FERRLECIT/NULECIT) IV       LOS: 9 days     Kayleen Memos, MD [facility] Pager [phone]  If 7PM-7AM, please contact night-coverage [email or web address] Password [facility] 12/09/2018, 11:38 AM

## 2018-12-10 LAB — CBC WITH DIFFERENTIAL/PLATELET
Abs Immature Granulocytes: 0.78 10*3/uL — ABNORMAL HIGH (ref 0.00–0.07)
Basophils Absolute: 0.1 10*3/uL (ref 0.0–0.1)
Basophils Relative: 1 %
Eosinophils Absolute: 0.3 10*3/uL (ref 0.0–0.5)
Eosinophils Relative: 3 %
HCT: 25.4 % — ABNORMAL LOW (ref 39.0–52.0)
Hemoglobin: 8.1 g/dL — ABNORMAL LOW (ref 13.0–17.0)
Immature Granulocytes: 6 %
Lymphocytes Relative: 9 %
Lymphs Abs: 1.2 10*3/uL (ref 0.7–4.0)
MCH: 27.6 pg (ref 26.0–34.0)
MCHC: 31.9 g/dL (ref 30.0–36.0)
MCV: 86.4 fL (ref 80.0–100.0)
Monocytes Absolute: 1.9 10*3/uL — ABNORMAL HIGH (ref 0.1–1.0)
Monocytes Relative: 14 %
Neutro Abs: 9.4 10*3/uL — ABNORMAL HIGH (ref 1.7–7.7)
Neutrophils Relative %: 67 %
Platelets: 231 10*3/uL (ref 150–400)
RBC: 2.94 MIL/uL — ABNORMAL LOW (ref 4.22–5.81)
RDW: 20.1 % — ABNORMAL HIGH (ref 11.5–15.5)
WBC: 13.7 10*3/uL — ABNORMAL HIGH (ref 4.0–10.5)
nRBC: 0.6 % — ABNORMAL HIGH (ref 0.0–0.2)

## 2018-12-10 LAB — COMPREHENSIVE METABOLIC PANEL
ALT: 267 U/L — ABNORMAL HIGH (ref 0–44)
AST: 36 U/L (ref 15–41)
Albumin: 2.6 g/dL — ABNORMAL LOW (ref 3.5–5.0)
Alkaline Phosphatase: 91 U/L (ref 38–126)
Anion gap: 16 — ABNORMAL HIGH (ref 5–15)
BUN: 85 mg/dL — ABNORMAL HIGH (ref 6–20)
CO2: 22 mmol/L (ref 22–32)
Calcium: 9.2 mg/dL (ref 8.9–10.3)
Chloride: 99 mmol/L (ref 98–111)
Creatinine, Ser: 10.17 mg/dL — ABNORMAL HIGH (ref 0.61–1.24)
GFR calc Af Amer: 6 mL/min — ABNORMAL LOW (ref >60)
GFR calc non Af Amer: 6 mL/min — ABNORMAL LOW (ref >60)
Glucose, Bld: 87 mg/dL (ref 70–99)
Potassium: 4.4 mmol/L (ref 3.5–5.1)
Sodium: 137 mmol/L (ref 135–145)
Total Bilirubin: 1.2 mg/dL (ref 0.3–1.2)
Total Protein: 6.5 g/dL (ref 6.5–8.1)

## 2018-12-10 LAB — GLUCOSE, CAPILLARY
Glucose-Capillary: 104 mg/dL — ABNORMAL HIGH (ref 70–99)
Glucose-Capillary: 98 mg/dL (ref 70–99)
Glucose-Capillary: 143 mg/dL — ABNORMAL HIGH (ref 70–99)
Glucose-Capillary: 94 mg/dL (ref 70–99)
Glucose-Capillary: 135 mg/dL — ABNORMAL HIGH (ref 70–99)
Glucose-Capillary: 85 mg/dL (ref 70–99)

## 2018-12-10 LAB — PHOSPHORUS: Phosphorus: 6.9 mg/dL — ABNORMAL HIGH (ref 2.5–4.6)

## 2018-12-10 MED ORDER — LIDOCAINE-PRILOCAINE 2.5-2.5 % EX CREA
1.0000 "application " | TOPICAL_CREAM | CUTANEOUS | Status: DC | PRN
  Filled 2018-12-10: qty 5

## 2018-12-10 MED ORDER — SODIUM CHLORIDE 0.9 % IV SOLN: 100.0000 mL | INTRAVENOUS | Status: DC | PRN

## 2018-12-10 MED ORDER — METOPROLOL TARTRATE 12.5 MG HALF TABLET: 12.5000 mg | ORAL_TABLET | Freq: Two times a day (BID) | ORAL | Status: DC

## 2018-12-10 MED ORDER — SUCROFERRIC OXYHYDROXIDE 500 MG PO CHEW
2000.0000 mg | CHEWABLE_TABLET | Freq: Three times a day (TID) | ORAL | Status: DC
  Administered 2018-12-10 – 2018-12-13 (×7): 2000 mg via ORAL
  Filled 2018-12-10 (×12): qty 4

## 2018-12-10 MED ORDER — PENTAFLUOROPROP-TETRAFLUOROETH EX AERO: 1.0000 "application " | INHALATION_SPRAY | CUTANEOUS | Status: DC | PRN

## 2018-12-10 MED ORDER — LIDOCAINE HCL (PF) 1 % IJ SOLN: 5.0000 mL | INTRAMUSCULAR | Status: DC | PRN

## 2018-12-10 MED ORDER — CHLORHEXIDINE GLUCONATE CLOTH 2 % EX PADS
6.0000 | MEDICATED_PAD | Freq: Every day | CUTANEOUS | Status: DC
  Administered 2018-12-11 – 2018-12-13 (×3): 6 via TOPICAL

## 2018-12-10 MED ORDER — METOPROLOL TARTRATE 12.5 MG HALF TABLET
12.5000 mg | ORAL_TABLET | Freq: Two times a day (BID) | ORAL | Status: DC
  Administered 2018-12-10 – 2018-12-11 (×3): 12.5 mg via ORAL
  Filled 2018-12-10 (×3): qty 1

## 2018-12-10 MED ORDER — ALTEPLASE 2 MG IJ SOLR: 2.0000 mg | Freq: Once | INTRAMUSCULAR | Status: DC | PRN

## 2018-12-10 MED ORDER — HEPARIN SODIUM (PORCINE) 1000 UNIT/ML DIALYSIS
1000.0000 [IU] | INTRAMUSCULAR | Status: DC | PRN
  Filled 2018-12-10: qty 1

## 2018-12-10 NOTE — Progress Notes (Addendum)
Physical Therapy Treatment Patient Details Name: [name] MRN: [id number] DOB: Feb 26, 1976 Today's Date: 12/10/2018    History of Present Illness 43 y.o. male admitted on 11/30/18 for SOB, COVID testing (-).  Pt dx with sepsis due to CAP, acute respiratory failure with hypoxia initally on BiPap, but ultimately intubated 11/30/18 and self extubated 12/06/18.  Pt normally on HD T, TH, Sat, but had to be on CRRT 6/24-6/26/20. Pt dx with ARDS, acute encepholopathy, septic shock due to aspiration,  RV dysfunction (related to ARDS), transaminitis, anemia of chronic disease, and elevated troponin (thought to be demand ischemia).  Pt with other significant PMH of DM, failed kidney transplant at [facility], PAF, morbid obesity, HTN, DM, CHF, sleep apnea.      PT Comments    Pt making good progress with mobility. Motivated and participatory during session although responses slowed. Expect he will do very well with CIR.    Follow Up Recommendations  CIR     Equipment Recommendations  Rolling walker with 5" wheels;3in1 (PT)    Recommendations for Other Services Rehab consult     Precautions / Restrictions Precautions Precautions: Fall Restrictions Weight Bearing Restrictions: No    Mobility  Bed Mobility Overal bed mobility: Needs Assistance Bed Mobility: Sit to Supine       Sit to supine: Min guard   General bed mobility comments: Assist for lines  Transfers Overall transfer level: Needs assistance Equipment used: Rolling walker (2 wheeled) Transfers: Sit to/from [facility] Sit to Stand: +2 physical assistance;Min assist Stand pivot transfers: Min assist;+2 safety/equipment       General transfer comment: Assist to bring hips up and for balance. Verbal cues for hand placement. Pt with rapid descent to seat surface when returning to sitting.  Ambulation/Gait Ambulation/Gait assistance: Min assist;+2 safety/equipment Gait Distance (Feet): 15 Feet(5' x 1, 15'  x 1) Assistive device: Rolling walker (2 wheeled) Gait Pattern/deviations: Step-through pattern;Decreased step length - right;Decreased step length - left Gait velocity: decr Gait velocity interpretation: <1.31 ft/sec, indicative of household ambulator General Gait Details: Assist for balance and support. Legs weak but no overt buckling. Close chair follow.   Stairs             Wheelchair Mobility    Modified Rankin (Stroke Patients Only)       Balance Overall balance assessment: Needs assistance Sitting-balance support: No upper extremity supported;Feet supported Sitting balance-Leahy Scale: Fair     Standing balance support: Bilateral upper extremity supported Standing balance-Leahy Scale: Poor Standing balance comment: walker and min assist for static standing                            Cognition Arousal/Alertness: Awake/alert Behavior During Therapy: WFL for tasks assessed/performed Overall Cognitive Status: Impaired/Different from baseline Area of Impairment: Problem solving                       Following Commands: Follows multi-step commands with increased time(followed ~10% of commands)   Awareness: Emergent Problem Solving: Slow processing;Decreased initiation;Requires verbal cues General Comments: Pt with slowed response time but motivated and engaged throughout session.       Exercises      General Comments        Pertinent Vitals/Pain Pain Assessment: No/denies pain    Home Living  Prior Function            PT Goals (current goals can now be found in the care plan section) Acute Rehab PT Goals Patient Stated Goal: get stronger PT Goal Formulation: With patient Time For Goal Achievement: 12/24/18 Potential to Achieve Goals: Good Progress towards PT goals: Goals met and updated - see care plan    Frequency    Min 3X/week      PT Plan Current plan remains appropriate     Co-evaluation              AM-PAC PT "6 Clicks" Mobility   Outcome Measure  Help needed turning from your back to your side while in a flat bed without using bedrails?: A Little Help needed moving from lying on your back to sitting on the side of a flat bed without using bedrails?: Total Help needed moving to and from a bed to a chair (including a wheelchair)?: A Little Help needed standing up from a chair using your arms (e.g., wheelchair or bedside chair)?: A Lot Help needed to walk in hospital room?: A Little Help needed climbing 3-5 steps with a railing? : Total 6 Click Score: 13    End of Session Equipment Utilized During Treatment: Oxygen Activity Tolerance: Patient tolerated treatment well Patient left: in bed;with call bell/phone within reach;with bed alarm set Nurse Communication: Mobility status PT Visit Diagnosis: Muscle weakness (generalized) (M62.81);Difficulty in walking, not elsewhere classified (R26.2);Other symptoms and signs involving the nervous system (R29.898)     Time: 5217-4715 PT Time Calculation (min) (ACUTE ONLY): 22 min  Charges:  $Gait Training: 8-22 mins                     George West Pager [phone] Office [place] 12/10/2018, 2:07 PM

## 2018-12-10 NOTE — Progress Notes (Signed)
Pt sats dropping down to 60's while sleeping then returning to 90's multiple times. Pt continued to refuse to wear nighttime CPAP per order. Desat's continued - pt continued to be educated on adverse effects of not wearing CPAP. Pt agreed to try it.

## 2018-12-10 NOTE — Progress Notes (Signed)
Inpatient Rehabilitation Admissions Coordinator  Inpatient Rehab Consult received. I met with patient at the bedside for rehabilitation assessment. Patient very sleepy sitting up in the recliner. With encouragement, patient able to interact with my discussion. We discussed goals and expectations of an inpatient rehab admission.  I will follow up on Monday to continue to assess most appropriate rehab venue.   Danne Baxter, RN, MSN Rehab Admissions Coordinator [phone] 12/10/2018 12:27 PM

## 2018-12-10 NOTE — Progress Notes (Signed)
PROGRESS NOTE  [name] OXB:[id number] DOB: Aug 18, 1975 DOA: 11/30/2018 PCP: Luetta Nutting, DO  HPI/Recap of past 24 hours: [name] is a 43 y.o. male  with past medical history significant for OSA on CPAP, paroxysmal A. fib, hypertension, chronic diastolic CHF with EF 55 to 60% with grade 2 diastolic dysfunction, ESRD on HD Tuesday Thursday and Saturday, failed renal transplant in 2014, type 2 diabetes, hypothyroidism who presented to [facility] ED 11/30/18 with respiratory distress and hypoxic respiratory failure presumed due to acute pulmonary edema after missing HD. Also possible HCAP. Required intubation in ED due to extremis. Developed ARDS and florid septic shock requiring epinephrine drip and multiorgan failure.  Transferred to [facility] service on 12/09/2018.  12/10/18: Patient seen and examined at his bedside.  He is somnolent on nasal cannula but easily arousable to voices he denies any chest pain or dyspnea at rest.  Blood pressure persistently elevated tachycardia.  Obtained twelve-lead EKG and started on Lopressor 12.5 mg twice daily.  Hemodialysis was on 12/09/2018.  Plan for hemodialysis tomorrow.   Assessment/Plan: Principal Problem:   Sepsis ([place]) Active Problems:   Essential hypertension   Acute respiratory failure with hypoxia (HCC)   Acute encephalopathy   Septic shock (HCC)  ARDS, pulmonary edema in the setting of missed hemodialysis post extubation on 12/06/2018 Self extubated on 12/06/2018\ Currently on 2 L oxygen by nasal cannula and saturating greater than 93% Independently reviewed chest x-ray done at admission which showed cardiomegaly with increase in pulmonary vascularity suggestive of pulmonary edema Volume status will be addressed with hemodialysis Completed hemodialysis on 12/09/2018 Plan hemodialysis tomorrow 12/11/2018 Continue renal diet with fluid restriction  ESRD on HD Tuesday Thursday Saturday Nephrology following Plan for hemodialysis tomorrow  12/11/2018  Uncontrolled hypertension Blood pressure persistently elevated Not on antihypertensive medications at home Also persistently tachycardic Started on Lopressor 12.5 mg twice daily  Sinus tachycardia, unclear etiology Per H&P patient uses [facility] No UDS done in admission Obtain twelve-lead EKG  OSA on CPAP CPAP nightly  Anemia of chronic disease Hemoglobin stable at 8.4 No sign of overt bleeding Aranesp per Nephrology IV iron supplement per nephrology  Resolved acute metabolic encephalopathy Continue to hold off on antipsychotics Reorient as needed Minimize sedation  History of noncompliance Education on the importance of compliance with hemodialysis  Morbid obesity BMI 40 Recommend weight loss outpatient with healthy dieting and regular physical activity.    Physical debility/ambulatory dysfunction PT OT assessed and recommended CIR CIR consult for possible placement 3 in 1 bedside commode Fall precautions   Code Status: Full code  Family Communication: None at bedside  Disposition Plan: Discharge possibly to CIR when hemodynamically stable and nephrology has signed off.  Consultants:  Nephrology  Procedures:  Hemodialysis  Antimicrobials:  None  DVT prophylaxis: Subcu heparin 3 times daily   Objective: Vitals:   12/09/18 1921 12/09/18 2326 12/10/18 0331 12/10/18 0755  BP: (!) 185/105 (!) 144/88 132/70 (!) 149/88  Pulse: 99 (!) 107 (!) 107 (!) 103  Resp: (!) 21 (!) 22 (!) 25 (!) 21  Temp: 99.5 F (37.5 C) 98.7 F (37.1 C) 98.9 F (37.2 C) 98.4 F (36.9 C)  TempSrc: Oral Oral Oral Oral  SpO2: 99% 98% 97% 93%  Weight:      Height:        Intake/Output Summary (Last 24 hours) at 12/10/2018 0928 Last data filed at 12/10/2018 0300 Gross per 24 hour  Intake 147.97 ml  Output 2900 ml  Net -2752.03  ml   Filed Weights   12/09/18 0500 12/09/18 1110 12/09/18 1416  Weight: (!) 140.9 kg (!) 141.7 kg (!) 139 kg    Exam:  . General: 43  y.o. year-old male [name] obese in no acute distress.  Somnolent but easily arousable to voices . Cardiovascular: Rate and rhythm with no rubs or gallops no JVD or thyromegaly.  Marland Kitchen Respiratory: Clear to auscultation no wheezes noted.  Poor inspiratory effort. . Abdomen: Obese nontender nondistended normal bowel sounds present. . Musculoskeletal: Trace lower extremity edema.  2 out of 4 pulses in all 4 extremities.     Data Reviewed: CBC: Recent Labs  Lab 12/05/18 0526 12/06/18 0631 12/06/18 0708 12/06/18 1420 12/07/18 0544 12/08/18 0313 12/09/18 1500  WBC 16.0* 12.9*  --   --  11.2* 9.8 10.8*  HGB 7.7* 8.6* 9.5* 9.2* 8.2* 8.2* 8.4*  HCT 25.3* 28.6* 28.0* 27.0* 26.7* 26.6* 26.8*  MCV 89.4 89.4  --   --  89.0 89.9 87.6  PLT 148* 171  --   --  162 179 073   Basic Metabolic Panel: Recent Labs  Lab 12/05/18 0526 12/06/18 0631 12/06/18 0708 12/06/18 0933 12/06/18 1420 12/07/18 0544 12/08/18 0313 12/09/18 1119 12/09/18 1500  NA 139 139 138  --  137 139 139 136  --   K 4.1 4.3 4.3  --  4.5 5.1 4.1 4.3  --   CL 103 102  --   --   --  101 99 98  --   CO2 23 25  --   --   --  24 22 21*  --   GLUCOSE 116* 136*  --   --   --  128* 98 113*  --   BUN 137* 112*  --   --   --  145* 110* 128*  --   CREATININE 10.59* 9.15*  --   --   --  11.08* 9.41* 12.05*  --   CALCIUM 8.6* 8.9  --   --   --  8.9 9.0 8.9  --   MG  --   --   --   --   --   --  2.7*  --  3.1*  PHOS  --   --   --  5.2*  --   --  6.3* 7.3* 7.4*   GFR: Estimated Creatinine Clearance: 11.6 mL/min (A) (by C-G formula based on SCr of 12.05 mg/dL (H)). Liver Function Tests: Recent Labs  Lab 12/04/18 0639 12/06/18 0631 12/09/18 1119  AST 1,217* 348*  --   ALT 2,036* 1,167*  --   ALKPHOS 98 101  --   BILITOT 1.1 1.3*  --   PROT 6.5 6.8  --   ALBUMIN 2.5* 2.4* 2.7*   No results for input(s): LIPASE, AMYLASE in the last 168 hours. No results for input(s): AMMONIA in the last 168 hours. Coagulation Profile: No results  for input(s): INR, PROTIME in the last 168 hours. Cardiac Enzymes: No results for input(s): CKTOTAL, CKMB, CKMBINDEX, TROPONINI in the last 168 hours. BNP (last 3 results) No results for input(s): PROBNP in the last 8760 hours. HbA1C: No results for input(s): HGBA1C in the last 72 hours. CBG: Recent Labs  Lab 12/09/18 1649 12/09/18 1917 12/09/18 2324 12/10/18 0329 12/10/18 0723  GLUCAP 96 107* 101* 104* 98   Lipid Profile: No results for input(s): CHOL, HDL, LDLCALC, TRIG, CHOLHDL, LDLDIRECT in the last 72 hours. Thyroid Function Tests: No results for input(s): TSH, T4TOTAL, FREET4,  T3FREE, THYROIDAB in the last 72 hours. Anemia Panel: No results for input(s): VITAMINB12, FOLATE, FERRITIN, TIBC, IRON, RETICCTPCT in the last 72 hours. Urine analysis:    Component Value Date/Time   COLORURINE STRAW (A) 05/10/2018 1811   APPEARANCEUR CLEAR 05/10/2018 1811   LABSPEC 1.012 05/10/2018 1811   PHURINE 7.0 05/10/2018 1811   GLUCOSEU 150 (A) 05/10/2018 1811   HGBUR SMALL (A) 05/10/2018 1811   BILIRUBINUR NEGATIVE 11/03/2018 1515   KETONESUR NEGATIVE 05/10/2018 1811   PROTEINUR Positive (A) 11/03/2018 1515   PROTEINUR >=300 (A) 05/10/2018 1811   UROBILINOGEN 0.2 11/03/2018 1515   NITRITE NEGATIVE 11/03/2018 1515   NITRITE NEGATIVE 05/10/2018 1811   LEUKOCYTESUR Small (1+) (A) 11/03/2018 1515   Sepsis Labs: @LABRCNTIP (procalcitonin:4,lacticidven:4)  ) Recent Results (from the past 240 hour(s))  Culture, blood (routine x 2)     Status: None   Collection Time: 11/30/18  5:10 PM   Specimen: BLOOD LEFT HAND  Result Value Ref Range Status   Specimen Description BLOOD LEFT HAND  Final   Special Requests   Final    BOTTLES DRAWN AEROBIC ONLY Blood Culture results may not be optimal due to an inadequate volume of blood received in culture bottles   Culture   Final    NO GROWTH 5 DAYS Performed at [facility], [place] [address], [place], [place] [address]    Report Status  12/05/2018 FINAL  Final  MRSA PCR Screening     Status: None   Collection Time: 11/30/18  5:20 PM   Specimen: Nasal Mucosa; Nasopharyngeal  Result Value Ref Range Status   MRSA by PCR NEGATIVE NEGATIVE Final    Comment:        The GeneXpert MRSA Assay (FDA approved for NASAL specimens only), is one component of a comprehensive MRSA colonization surveillance program. It is not intended to diagnose MRSA infection nor to guide or monitor treatment for MRSA infections. Performed at [facility], [place] [address], [place], [place] [address]   Culture, respiratory (tracheal aspirate)     Status: None   Collection Time: 12/01/18  3:16 AM   Specimen: Tracheal Aspirate; Respiratory  Result Value Ref Range Status   Specimen Description TRACHEAL ASPIRATE  Final   Special Requests NONE  Final   Gram Stain   Final    RARE WBC PRESENT,BOTH PMN AND MONONUCLEAR NO ORGANISMS SEEN    Culture   Final    NO GROWTH 2 DAYS Performed at [facility], [address] [place], [place] [address]    Report Status 12/03/2018 FINAL  Final      Studies: No results found.  Scheduled Meds: . B-complex with vitamin C  1 tablet Per Tube Daily  . Chlorhexidine Gluconate Cloth  6 each Topical Q0600  . Chlorhexidine Gluconate Cloth  6 each Topical Q0600  . Chlorhexidine Gluconate Cloth  6 each Topical Q0600  . darbepoetin (ARANESP) injection - DIALYSIS  100 mcg Intravenous Q Tue-HD  . feeding supplement (PRO-STAT SUGAR FREE 64)  30 mL Oral BID  . heparin injection (subcutaneous)  5,000 Units Subcutaneous Q8H  . insulin aspart  0-9 Units Subcutaneous Q4H  . mouth rinse  15 mL Mouth Rinse BID  . metoprolol tartrate  12.5 mg Oral BID  . senna-docusate  1 tablet Per Tube Daily  . sucroferric oxyhydroxide  2,000 mg Oral TID WC    Continuous Infusions: . sodium chloride 10 mL/hr at 12/08/18 0600  . sodium chloride  10 mL/hr at 12/07/18 0938  . sodium chloride    . sodium chloride    . ferric  gluconate (FERRLECIT/NULECIT) IV Stopped (12/09/18 1555)     LOS: 10 days     Kayleen Memos, MD [facility] Pager [phone]  If 7PM-7AM, please contact night-coverage [email or web address] Password [email or web address] 12/10/2018, 9:28 AM

## 2018-12-10 NOTE — Progress Notes (Signed)
Inpatient Rehabilitation Admissions Coordinator  I will begin authorization with [employer] for a possible inpt admit next week pending insurance approval when medically ready.  Danne Baxter, RN, MSN Rehab Admissions Coordinator [phone] 12/10/2018 2:12 PM

## 2018-12-10 NOTE — Progress Notes (Signed)
[facility] ROUNDING NOTE   Subjective:   This is a 43 year old gentleman was admitted with pulmonary edema and acute hypoxic respiratory failure with aspiration and shock requiring a brief treatment with CRRT 12/01/2018 - 12/03/2018.  End-stage renal disease Tuesday Thursday Saturday dialysis  He has a history of failed renal transplant 10/2012 followed by [facility], not on any immunosuppressant drugs.  He has a history of hypertension diabetes mellitus type 2 and congestive heart failure with morbid obesity and obstructive sleep apnea on CPAP.  He also has a history of gastroesophageal reflux disease.  Patient appears to be much better he is calm.  He states he feels weak this morning.  Last dialysis treatment 12/09/2018 ultrafiltration 2.9 L next dialysis treatment planned for 12/11/2018  Blood pressure 149/88 pulse 107 temperature 98.4 O2 sats 93% 2 L HFC  Sodium 136 potassium 4.3 chloride 98 CO2 21 BUN 128 creatinine 12.  Calcium 8.9 phosphorus 7.4 magnesium 3.1 WBC 10.8 hemoglobin 8.4 platelets 230 Last T sats 19% 12/06/2018   pro-stat 60 cc 3 times daily, insulin sliding scale, Velphoro 1 g 3 times daily with meals, darbepoetin 100 mcg q. Tuesday      IV ferric gluconate 125 mg Tuesday Thursday Saturday.  Lopressor 12.5 mg twice daily  Feeding supplement vital AF 1 L at 40 cc an hour  Chest x-ray 12/06/2018 endotracheal tube and NG tube in stable position left IJ stable cardiomegaly  Ultrasound abdomen 12/02/2018 echogenicity of liver no other acute pathology unremarkable globe gallbladder with no evidence of dilation  2D echo EF 60 to 65% 12/01/2018   Objective:  Vital signs in last 24 hours:  Temp:  [98.4 F (36.9 C)-99.5 F (37.5 C)] 98.4 F (36.9 C) (07/03 0755) Pulse Rate:  [99-119] 103 (07/03 0755) Resp:  [21-36] 21 (07/03 0755) BP: (132-190)/(70-113) 149/88 (07/03 0755) SpO2:  [93 %-100 %] 93 % (07/03 0755) Weight:  [139 kg-141.7 kg] 139 kg (07/02 1416)  Weight change:  0.8 kg Filed Weights   12/09/18 0500 12/09/18 1110 12/09/18 1416  Weight: (!) 140.9 kg (!) 141.7 kg (!) 139 kg    Intake/Output: I/O last 3 completed shifts: In: 148 [I.V.:38; IV Piggyback:110] Out: 2900 [Other:2900]   Intake/Output this shift:  No intake/output data recorded.   Agitated CVS- RRR no JVP faint systolic murmur RS-some fine inspiratory crackles heard at the bases ABD- BS present soft non-distended EXT-1+ peripheral edema   Basic Metabolic Panel: Recent Labs  Lab 12/05/18 0526 12/06/18 0631 12/06/18 0708 12/06/18 0933 12/06/18 1420 12/07/18 0544 12/08/18 0313 12/09/18 1119 12/09/18 1500  NA 139 139 138  --  137 139 139 136  --   K 4.1 4.3 4.3  --  4.5 5.1 4.1 4.3  --   CL 103 102  --   --   --  101 99 98  --   CO2 23 25  --   --   --  24 22 21*  --   GLUCOSE 116* 136*  --   --   --  128* 98 113*  --   BUN 137* 112*  --   --   --  145* 110* 128*  --   CREATININE 10.59* 9.15*  --   --   --  11.08* 9.41* 12.05*  --   CALCIUM 8.6* 8.9  --   --   --  8.9 9.0 8.9  --   MG  --   --   --   --   --   --  2.7*  --  3.1*  PHOS  --   --   --  5.2*  --   --  6.3* 7.3* 7.4*    Liver Function Tests: Recent Labs  Lab 12/04/18 0639 12/06/18 0631 12/09/18 1119  AST 1,217* 348*  --   ALT 2,036* 1,167*  --   ALKPHOS 98 101  --   BILITOT 1.1 1.3*  --   PROT 6.5 6.8  --   ALBUMIN 2.5* 2.4* 2.7*   No results for input(s): LIPASE, AMYLASE in the last 168 hours. No results for input(s): AMMONIA in the last 168 hours.  CBC: Recent Labs  Lab 12/05/18 0526 12/06/18 0631 12/06/18 0708 12/06/18 1420 12/07/18 0544 12/08/18 0313 12/09/18 1500  WBC 16.0* 12.9*  --   --  11.2* 9.8 10.8*  HGB 7.7* 8.6* 9.5* 9.2* 8.2* 8.2* 8.4*  HCT 25.3* 28.6* 28.0* 27.0* 26.7* 26.6* 26.8*  MCV 89.4 89.4  --   --  89.0 89.9 87.6  PLT 148* 171  --   --  162 179 230    Cardiac Enzymes: No results for input(s): CKTOTAL, CKMB, CKMBINDEX, TROPONINI in the last 168  hours.  BNP: Invalid input(s): POCBNP  CBG: Recent Labs  Lab 12/09/18 1649 12/09/18 1917 12/09/18 2324 12/10/18 0329 12/10/18 0723  GLUCAP 96 107* 101* 104* 98    Microbiology: Results for orders placed or performed during the hospital encounter of 11/30/18  SARS Coronavirus 2 (CEPHEID- Performed in [place] hospital lab), Hosp Order     Status: None   Collection Time: 11/30/18  8:35 AM   Specimen: Nasopharyngeal Swab  Result Value Ref Range Status   SARS Coronavirus 2 NEGATIVE NEGATIVE Final    Comment: (NOTE) If result is NEGATIVE SARS-CoV-2 target nucleic acids are NOT DETECTED. The SARS-CoV-2 RNA is generally detectable in upper and lower  respiratory specimens during the acute phase of infection. The lowest  concentration of SARS-CoV-2 viral copies this assay can detect is 250  copies / mL. A negative result does not preclude SARS-CoV-2 infection  and should not be used as the sole basis for treatment or other  patient management decisions.  A negative result may occur with  improper specimen collection / handling, submission of specimen other  than nasopharyngeal swab, presence of viral mutation(s) within the  areas targeted by this assay, and inadequate number of viral copies  (<250 copies / mL). A negative result must be combined with clinical  observations, patient history, and epidemiological information. If result is POSITIVE SARS-CoV-2 target nucleic acids are DETECTED. The SARS-CoV-2 RNA is generally detectable in upper and lower  respiratory specimens dur ing the acute phase of infection.  Positive  results are indicative of active infection with SARS-CoV-2.  Clinical  correlation with patient history and other diagnostic information is  necessary to determine patient infection status.  Positive results do  not rule out bacterial infection or co-infection with other viruses. If result is PRESUMPTIVE POSTIVE SARS-CoV-2 nucleic acids MAY BE PRESENT.   A  presumptive positive result was obtained on the submitted specimen  and confirmed on repeat testing.  While 2019 novel coronavirus  (SARS-CoV-2) nucleic acids may be present in the submitted sample  additional confirmatory testing may be necessary for epidemiological  and / or clinical management purposes  to differentiate between  SARS-CoV-2 and other Sarbecovirus currently known to infect humans.  If clinically indicated additional testing with an alternate test  methodology [id number]) is advised. The SARS-CoV-2 RNA is generally  detectable in upper and lower respiratory sp ecimens during the acute  phase of infection. The expected result is Negative. Fact Sheet for Patients:  StrictlyIdeas.no Fact Sheet for Healthcare Providers: BankingDealers.co.za This test is not yet approved or cleared by the [place] FDA and has been authorized for detection and/or diagnosis of SARS-CoV-2 by FDA under an Emergency Use Authorization (EUA).  This EUA will remain in effect (meaning this test can be used) for the duration of the COVID-19 declaration under Section 564(b)(1) of the Act, 21 U.S.C. section 360bbb-3(b)(1), unless the authorization is terminated or revoked sooner. Performed at [facility], [place] [address], [place], [place] [address]   Blood culture (routine x 2)     Status: None   Collection Time: 11/30/18  8:57 AM   Specimen: BLOOD RIGHT HAND  Result Value Ref Range Status   Specimen Description BLOOD RIGHT HAND  Final   Special Requests   Final    BOTTLES DRAWN AEROBIC AND ANAEROBIC Blood Culture adequate volume   Culture   Final    NO GROWTH 5 DAYS Performed at [facility], [place] [address] [place], [place] [address]    Report Status 12/05/2018 FINAL  Final  Culture, blood (routine x 2)     Status: None   Collection Time: 11/30/18  5:10 PM   Specimen: BLOOD LEFT HAND  Result Value Ref Range Status   Specimen  Description BLOOD LEFT HAND  Final   Special Requests   Final    BOTTLES DRAWN AEROBIC ONLY Blood Culture results may not be optimal due to an inadequate volume of blood received in culture bottles   Culture   Final    NO GROWTH 5 DAYS Performed at [facility], [place] [address] [place], [place] [address]    Report Status 12/05/2018 FINAL  Final  MRSA PCR Screening     Status: None   Collection Time: 11/30/18  5:20 PM   Specimen: Nasal Mucosa; Nasopharyngeal  Result Value Ref Range Status   MRSA by PCR NEGATIVE NEGATIVE Final    Comment:        The GeneXpert MRSA Assay (FDA approved for NASAL specimens only), is one component of a comprehensive MRSA colonization surveillance program. It is not intended to diagnose MRSA infection nor to guide or monitor treatment for MRSA infections. Performed at [facility], [place] [address], [place], [place] [address]   Culture, respiratory (tracheal aspirate)     Status: None   Collection Time: 12/01/18  3:16 AM   Specimen: Tracheal Aspirate; Respiratory  Result Value Ref Range Status   Specimen Description TRACHEAL ASPIRATE  Final   Special Requests NONE  Final   Gram Stain   Final    RARE WBC PRESENT,BOTH PMN AND MONONUCLEAR NO ORGANISMS SEEN    Culture   Final    NO GROWTH 2 DAYS Performed at [facility], [address] [place],  [address]    Report Status 12/03/2018 FINAL  Final    Coagulation Studies: No results for input(s): LABPROT, INR in the last 72 hours.  Urinalysis: No results for input(s): COLORURINE, LABSPEC, PHURINE, GLUCOSEU, HGBUR, BILIRUBINUR, KETONESUR, PROTEINUR, UROBILINOGEN, NITRITE, LEUKOCYTESUR in the last 72 hours.  Invalid input(s): APPERANCEUR    Imaging: No results found.   Medications:   . sodium chloride 10 mL/hr at 12/08/18 0600  . sodium chloride 10 mL/hr at 12/07/18 0938  . ferric gluconate (FERRLECIT/NULECIT) IV Stopped (12/09/18 1555)   .  B-complex with vitamin  C  1 tablet Per Tube Daily  . Chlorhexidine Gluconate Cloth  6 each Topical Q0600  . Chlorhexidine Gluconate Cloth  6 each Topical Q0600  . darbepoetin (ARANESP) injection - DIALYSIS  100 mcg Intravenous Q Tue-HD  . feeding supplement (PRO-STAT SUGAR FREE 64)  30 mL Oral BID  . heparin injection (subcutaneous)  5,000 Units Subcutaneous Q8H  . insulin aspart  0-9 Units Subcutaneous Q4H  . mouth rinse  15 mL Mouth Rinse BID  . metoprolol tartrate  12.5 mg Oral BID  . senna-docusate  1 tablet Per Tube Daily  . sucroferric oxyhydroxide  1,000 mg Oral TID WC   sodium chloride, acetaminophen **OR** acetaminophen, albuterol, bisacodyl, haloperidol lactate, labetalol, ondansetron **OR** ondansetron (ZOFRAN) IV  Assessment/ Plan:   ESRD-Tuesday Thursday Saturday dialysis.  Did receive dialysis Sunday due to high volume Saturday.  he received a brief period of CRRT on admission due to hypotension and shock.  Scheduled dialysis 12/11/2018  Anemia appears stable .  Darbepoetin 100 mcg q. Thursday, T sats 19% patient is receiving IV iron  Bones last PTH 12/06/2018 was 136 continues on Velphoro 1 g 3 times daily with meals.  We will increase Velphoro to 2 g with meals.  Hypotension/shock off pressors appears to have resolved appears to tolerate fluid removal with dialysis  Ventilator dependent respiratory failure extubated on BiPAP and high flow nasal cannula  Diastolic dysfunction stable  Diabetes mellitus insulin sliding scale  Malnutrition continues on Protostat feeding supplements.    LOS: [place] @TODAY @8 :14 AM

## 2018-12-11 LAB — GLUCOSE, CAPILLARY
Glucose-Capillary: 109 mg/dL — ABNORMAL HIGH (ref 70–99)
Glucose-Capillary: 109 mg/dL — ABNORMAL HIGH (ref 70–99)
Glucose-Capillary: 79 mg/dL (ref 70–99)
Glucose-Capillary: 80 mg/dL (ref 70–99)
Glucose-Capillary: 89 mg/dL (ref 70–99)
Glucose-Capillary: 95 mg/dL (ref 70–99)

## 2018-12-11 LAB — RENAL FUNCTION PANEL
Albumin: 2.4 g/dL — ABNORMAL LOW (ref 3.5–5.0)
Anion gap: 17 — ABNORMAL HIGH (ref 5–15)
BUN: 103 mg/dL — ABNORMAL HIGH (ref 6–20)
CO2: 23 mmol/L (ref 22–32)
Calcium: 9 mg/dL (ref 8.9–10.3)
Chloride: 95 mmol/L — ABNORMAL LOW (ref 98–111)
Creatinine, Ser: 12.09 mg/dL — ABNORMAL HIGH (ref 0.61–1.24)
GFR calc Af Amer: 5 mL/min — ABNORMAL LOW (ref >60)
GFR calc non Af Amer: 4 mL/min — ABNORMAL LOW (ref >60)
Glucose, Bld: 118 mg/dL — ABNORMAL HIGH (ref 70–99)
Phosphorus: 8.4 mg/dL — ABNORMAL HIGH (ref 2.5–4.6)
Potassium: 4.6 mmol/L (ref 3.5–5.1)
Sodium: 135 mmol/L (ref 135–145)

## 2018-12-11 MED ORDER — METOPROLOL TARTRATE 25 MG PO TABS
25.0000 mg | ORAL_TABLET | Freq: Two times a day (BID) | ORAL | Status: DC
  Administered 2018-12-11 – 2018-12-13 (×4): 25 mg via ORAL
  Filled 2018-12-11 (×4): qty 1

## 2018-12-11 NOTE — Progress Notes (Signed)
Pt removed CPAP mask several times during night - would desat to 58. Mask replaced each time - sats up to high 90's.

## 2018-12-11 NOTE — Progress Notes (Signed)
[facility] ROUNDING NOTE   Subjective:   This is a 43 year old gentleman was admitted with pulmonary edema and acute hypoxic respiratory failure with aspiration and shock requiring a brief treatment with CRRT 12/01/2018 - 12/03/2018.  End-stage renal disease Tuesday Thursday Saturday dialysis  He has a history of failed renal transplant 10/2012 followed by [facility], not on any immunosuppressant drugs.  He has a history of hypertension diabetes mellitus type 2 and congestive heart failure with morbid obesity and obstructive sleep apnea on CPAP.  He also has a history of gastroesophageal reflux disease.  Patient appears to be much better he is calm.  He states he feels weak this morning.  Last dialysis treatment 12/09/2018 ultrafiltration 2.9 L.  Dialysis is planned for 12/11/2018  Blood pressure 150/98 pulse 85 temperature 98.9 O2 sats 98% CPAP  Sodium 137 potassium 4.4 chloride 99 CO2 22 BUN 85 creatinine 10 glucose 87 calcium 9.2 albumin 2.6 AST 36 ALT 267 phosphorus 6.9 WBC 13.7 hemoglobin 8.1 platelets 231 Last T sats 19% 12/06/2018   pro-stat 60 cc 3 times daily, insulin sliding scale, Velphoro 1 g 3 times daily with meals, darbepoetin 100 mcg q. Tuesday      IV ferric gluconate 125 mg Tuesday Thursday Saturday.  Lopressor 12.5 mg twice daily  Feeding supplement vital AF 1 L at 40 cc an hour  Chest x-ray 12/06/2018 endotracheal tube and NG tube in stable position left IJ stable cardiomegaly  Ultrasound abdomen 12/02/2018 echogenicity of liver no other acute pathology unremarkable globe gallbladder with no evidence of dilation  2D echo EF 60 to 65% 12/01/2018   Objective:  Vital signs in last 24 hours:  Temp:  [98.2 F (36.8 C)-98.9 F (37.2 C)] 98.9 F (37.2 C) (07/04 0421) Pulse Rate:  [85-103] 85 (07/04 0421) Resp:  [16-23] 21 (07/04 0421) BP: (137-175)/(85-98) 151/98 (07/04 0421) SpO2:  [90 %-100 %] 96 % (07/04 0421) Weight:  [137.4 kg] 137.4 kg (07/04 0500)  Weight change:  -4.3 kg Filed Weights   12/09/18 1110 12/09/18 1416 12/11/18 0500  Weight: (!) 141.7 kg (!) 139 kg (!) 137.4 kg    Intake/Output: I/O last 3 completed shifts: In: 628 [P.O.:480; I.V.:38; IV Piggyback:110] Out: -    Intake/Output this shift:  No intake/output data recorded.   Agitated CVS- RRR no JVP faint systolic murmur RS-some fine inspiratory crackles heard at the bases ABD- BS present soft non-distended EXT-1+ peripheral edema   Basic Metabolic Panel: Recent Labs  Lab 12/06/18 0631  12/06/18 0933 12/06/18 1420 12/07/18 0544 12/08/18 0313 12/09/18 1119 12/09/18 1500 12/10/18 0835 12/10/18 0855  NA 139   < >  --  137 139 139 136  --   --  137  K 4.3   < >  --  4.5 5.1 4.1 4.3  --   --  4.4  CL 102  --   --   --  101 99 98  --   --  99  CO2 25  --   --   --  24 22 21*  --   --  22  GLUCOSE 136*  --   --   --  128* 98 113*  --   --  87  BUN 112*  --   --   --  145* 110* 128*  --   --  85*  CREATININE 9.15*  --   --   --  11.08* 9.41* 12.05*  --   --  10.17*  CALCIUM  8.9  --   --   --  8.9 9.0 8.9  --   --  9.2  MG  --   --   --   --   --  2.7*  --  3.1*  --   --   PHOS  --   --  5.2*  --   --  6.3* 7.3* 7.4* 6.9*  --    < > = values in this interval not displayed.    Liver Function Tests: Recent Labs  Lab 12/06/18 0631 12/09/18 1119 12/10/18 0855  AST 348*  --  36  ALT 1,167*  --  267*  ALKPHOS 101  --  91  BILITOT 1.3*  --  1.2  PROT 6.8  --  6.5  ALBUMIN 2.4* 2.7* 2.6*   No results for input(s): LIPASE, AMYLASE in the last 168 hours. No results for input(s): AMMONIA in the last 168 hours.  CBC: Recent Labs  Lab 12/06/18 0631  12/06/18 1420 12/07/18 0544 12/08/18 0313 12/09/18 1500 12/10/18 0855  WBC 12.9*  --   --  11.2* 9.8 10.8* 13.7*  NEUTROABS  --   --   --   --   --   --  9.4*  HGB 8.6*   < > 9.2* 8.2* 8.2* 8.4* 8.1*  HCT 28.6*   < > 27.0* 26.7* 26.6* 26.8* 25.4*  MCV 89.4  --   --  89.0 89.9 87.6 86.4  PLT 171  --   --  162 179 230  231   < > = values in this interval not displayed.    Cardiac Enzymes: No results for input(s): CKTOTAL, CKMB, CKMBINDEX, TROPONINI in the last 168 hours.  BNP: Invalid input(s): POCBNP  CBG: Recent Labs  Lab 12/10/18 1213 12/10/18 1635 12/10/18 2004 12/10/18 2326 12/11/18 0417  GLUCAP 143* 94 135* 85 95    Microbiology: Results for orders placed or performed during the hospital encounter of 11/30/18  SARS Coronavirus 2 (CEPHEID- Performed in [place] hospital lab), Hosp Order     Status: None   Collection Time: 11/30/18  8:35 AM   Specimen: Nasopharyngeal Swab  Result Value Ref Range Status   SARS Coronavirus 2 NEGATIVE NEGATIVE Final    Comment: (NOTE) If result is NEGATIVE SARS-CoV-2 target nucleic acids are NOT DETECTED. The SARS-CoV-2 RNA is generally detectable in upper and lower  respiratory specimens during the acute phase of infection. The lowest  concentration of SARS-CoV-2 viral copies this assay can detect is 250  copies / mL. A negative result does not preclude SARS-CoV-2 infection  and should not be used as the sole basis for treatment or other  patient management decisions.  A negative result may occur with  improper specimen collection / handling, submission of specimen other  than nasopharyngeal swab, presence of viral mutation(s) within the  areas targeted by this assay, and inadequate number of viral copies  (<250 copies / mL). A negative result must be combined with clinical  observations, patient history, and epidemiological information. If result is POSITIVE SARS-CoV-2 target nucleic acids are DETECTED. The SARS-CoV-2 RNA is generally detectable in upper and lower  respiratory specimens dur ing the acute phase of infection.  Positive  results are indicative of active infection with SARS-CoV-2.  Clinical  correlation with patient history and other diagnostic information is  necessary to determine patient infection status.  Positive results do   not rule out bacterial infection or co-infection with other viruses. If result is  PRESUMPTIVE POSTIVE SARS-CoV-2 nucleic acids MAY BE PRESENT.   A presumptive positive result was obtained on the submitted specimen  and confirmed on repeat testing.  While 2019 novel coronavirus  (SARS-CoV-2) nucleic acids may be present in the submitted sample  additional confirmatory testing may be necessary for epidemiological  and / or clinical management purposes  to differentiate between  SARS-CoV-2 and other Sarbecovirus currently known to infect humans.  If clinically indicated additional testing with an alternate test  methodology [phone]) is advised. The SARS-CoV-2 RNA is generally  detectable in upper and lower respiratory sp ecimens during the acute  phase of infection. The expected result is Negative. Fact Sheet for Patients:  StrictlyIdeas.no Fact Sheet for Healthcare Providers: BankingDealers.co.za This test is not yet approved or cleared by the [place] FDA and has been authorized for detection and/or diagnosis of SARS-CoV-2 by FDA under an Emergency Use Authorization (EUA).  This EUA will remain in effect (meaning this test can be used) for the duration of the COVID-19 declaration under Section 564(b)(1) of the Act, 21 U.S.C. section 360bbb-3(b)(1), unless the authorization is terminated or revoked sooner. Performed at [facility], [place] [address], [place], [place] [address]   Blood culture (routine x 2)     Status: None   Collection Time: 11/30/18  8:57 AM   Specimen: BLOOD RIGHT HAND  Result Value Ref Range Status   Specimen Description BLOOD RIGHT HAND  Final   Special Requests   Final    BOTTLES DRAWN AEROBIC AND ANAEROBIC Blood Culture adequate volume   Culture   Final    NO GROWTH 5 DAYS Performed at [facility], [place] [address], [place], [place] [address]    Report Status 12/05/2018 FINAL  Final  Culture,  blood (routine x 2)     Status: None   Collection Time: 11/30/18  5:10 PM   Specimen: BLOOD LEFT HAND  Result Value Ref Range Status   Specimen Description BLOOD LEFT HAND  Final   Special Requests   Final    BOTTLES DRAWN AEROBIC ONLY Blood Culture results may not be optimal due to an inadequate volume of blood received in culture bottles   Culture   Final    NO GROWTH 5 DAYS Performed at [facility], [place] [address], [place], [place] [address]    Report Status 12/05/2018 FINAL  Final  MRSA PCR Screening     Status: None   Collection Time: 11/30/18  5:20 PM   Specimen: Nasal Mucosa; Nasopharyngeal  Result Value Ref Range Status   MRSA by PCR NEGATIVE NEGATIVE Final    Comment:        The GeneXpert MRSA Assay (FDA approved for NASAL specimens only), is one component of a comprehensive MRSA colonization surveillance program. It is not intended to diagnose MRSA infection nor to guide or monitor treatment for MRSA infections. Performed at [facility], [place] [address] [place], [place] [address]   Culture, respiratory (tracheal aspirate)     Status: None   Collection Time: 12/01/18  3:16 AM   Specimen: Tracheal Aspirate; Respiratory  Result Value Ref Range Status   Specimen Description TRACHEAL ASPIRATE  Final   Special Requests NONE  Final   Gram Stain   Final    RARE WBC PRESENT,BOTH PMN AND MONONUCLEAR NO ORGANISMS SEEN    Culture   Final    NO GROWTH 2 DAYS Performed at [facility], [address], [place], [place] [address]    Report Status 12/03/2018 FINAL  Final    Coagulation Studies: No results for input(s): LABPROT, INR in the last 72 hours.  Urinalysis: No results for input(s): COLORURINE, LABSPEC, PHURINE, GLUCOSEU, HGBUR, BILIRUBINUR, KETONESUR, PROTEINUR, UROBILINOGEN, NITRITE, LEUKOCYTESUR in the last 72 hours.  Invalid input(s): APPERANCEUR    Imaging: No results found.   Medications:   . sodium chloride 10 mL/hr at 12/08/18  0600  . sodium chloride 10 mL/hr at 12/07/18 0938  . sodium chloride    . sodium chloride    . ferric gluconate (FERRLECIT/NULECIT) IV Stopped (12/09/18 1555)   . B-complex with vitamin C  1 tablet Per Tube Daily  . Chlorhexidine Gluconate Cloth  6 each Topical Q0600  . Chlorhexidine Gluconate Cloth  6 each Topical Q0600  . Chlorhexidine Gluconate Cloth  6 each Topical Q0600  . darbepoetin (ARANESP) injection - DIALYSIS  100 mcg Intravenous Q Tue-HD  . feeding supplement (PRO-STAT SUGAR FREE 64)  30 mL Oral BID  . heparin injection (subcutaneous)  5,000 Units Subcutaneous Q8H  . insulin aspart  0-9 Units Subcutaneous Q4H  . mouth rinse  15 mL Mouth Rinse BID  . metoprolol tartrate  12.5 mg Oral BID  . senna-docusate  1 tablet Per Tube Daily  . sucroferric oxyhydroxide  2,000 mg Oral TID WC   sodium chloride, sodium chloride, sodium chloride, acetaminophen **OR** acetaminophen, albuterol, alteplase, bisacodyl, haloperidol lactate, heparin, labetalol, lidocaine (PF), lidocaine-prilocaine, ondansetron **OR** ondansetron (ZOFRAN) IV, pentafluoroprop-tetrafluoroeth  Assessment/ Plan:   ESRD-Tuesday Thursday Saturday dialysis.  Did receive dialysis Sunday due to high volume Saturday.  he received a brief period of CRRT on admission due to hypotension and shock.  Scheduled dialysis 12/11/2018  Anemia appears stable .  Darbepoetin 100 mcg q. Thursday, T sats 19% patient is receiving IV iron  Bones last PTH 12/06/2018 was 136 continues on Velphoro 1 g 3 times daily with meals.  We will increase Velphoro to 2 g with meals.  Hypotension/shock off pressors appears to have resolved appears to tolerate fluid removal with dialysis  Ventilator dependent respiratory failure extubated on BiPAP and high flow nasal cannula  Diastolic dysfunction stable  Diabetes mellitus insulin sliding scale  Malnutrition continues on Protostat feeding supplements  History of paroxysmal atrial fibrillation .     LOS: [place] @TODAY @7 :35 AM

## 2018-12-11 NOTE — Progress Notes (Signed)
PROGRESS NOTE  [name] TLX:[id number] DOB: [birth year] DOA: 11/30/2018 PCP: Luetta Nutting, DO  HPI/Recap of past 24 hours: [name] is a 43 y.o. male  with past medical history significant for OSA on CPAP, paroxysmal A. fib, hypertension, chronic diastolic CHF with EF 55 to 60% with grade 2 diastolic dysfunction, ESRD on HD Tuesday Thursday and Saturday, failed renal transplant in 2014, type 2 diabetes, hypothyroidism who presented to [facility] ED 11/30/18 with respiratory distress and hypoxic respiratory failure presumed due to acute pulmonary edema after missing HD. Also possible HCAP. Required intubation in ED due to extremis. Developed ARDS and florid septic shock requiring epinephrine drip and multiorgan failure.  Transferred to [employer] [facility] [employer] service on 12/09/2018.  12/11/18: Patient seen and examined at his bedside.  He is on CPAP.  He has no new complaints.  He denies chest pain or dyspnea at rest.  Plan for hemodialysis today and possible transfer to CIR on Monday. Net I&O -2.1 L since admission.   Assessment/Plan: Principal Problem:   Sepsis ([place]) Active Problems:   Essential hypertension   Acute respiratory failure with hypoxia (HCC)   Acute encephalopathy   Septic shock (HCC)  Resolving ARDS, pulmonary edema in the setting of missed hemodialysis post extubation on 12/06/2018 Self extubated on 12/06/2018\ Currently on 2 L oxygen by nasal cannula and saturating greater than 93% Independently reviewed chest x-ray done at admission which showed cardiomegaly with increase in pulmonary vascularity suggestive of pulmonary edema Volume status will be addressed with hemodialysis Completed hemodialysis on 12/09/2018 Plan hemodialysis today 12/11/2018 Continue renal diet with fluid restriction  ESRD on HD Tuesday Thursday Saturday Nephrology following Plan for hemodialysis today 12/11/2018  Uncontrolled hypertension/tachycardia Not on antihypertensive medications at home During this  admission was started on Lopressor 12.5 mg twice daily will increase to 25 mg twice daily due to persistently elevated blood pressure. Closely monitor vital signs  Resolved sinus tachycardia now on beta-blocker, unclear etiology Per H&P patient uses [facility] No UDS done in admission  OSA on CPAP CPAP nightly  Anemia of chronic disease Hemoglobin stable at 8.4 No sign of overt bleeding Aranesp per Nephrology IV iron supplement per nephrology  Resolved acute metabolic encephalopathy Continue to minimize sedation  History of noncompliance Education on the importance of compliance with hemodialysis and CPAP machine  Morbid obesity BMI 40 Recommend weight loss outpatient with healthy dieting and regular physical activity.    Physical debility/ambulatory dysfunction PT OT assessed and recommended CIR CIR consult for possible placement 3 in 1 bedside commode Fall precautions   Code Status: Full code  Family Communication: None at bedside  Disposition Plan: Discharge possibly to CIR when hemodynamically stable and nephrology has signed off possibly tomorrow 12/12/18 or 12/13/18 pending bed availability.  Consultants:  Nephrology  Procedures:  Hemodialysis  Antimicrobials:  None  DVT prophylaxis: Subcu heparin 3 times daily   Objective: Vitals:   12/11/18 0421 12/11/18 0500 12/11/18 0809 12/11/18 0821  BP: (!) 151/98  (!) 168/99   Pulse: 85   80  Resp: (!) 21   20  Temp: 98.9 F (37.2 C)  98 F (36.7 C)   TempSrc: Axillary  Oral   SpO2: 96%     Weight:  (!) 137.4 kg    Height:        Intake/Output Summary (Last 24 hours) at 12/11/2018 0944 Last data filed at 12/10/2018 1200 Gross per 24 hour  Intake 480 ml  Output -  Net 480 ml  Filed Weights   12/09/18 1110 12/09/18 1416 12/11/18 0500  Weight: (!) 141.7 kg (!) 139 kg (!) 137.4 kg    Exam:  . General: 43 y.o. year-old male morbidly obese in no acute distress.  Somnolent but easily arousable to voices.    . Cardiovascular: Regular rate and rhythm with no rubs or gallops no JVD or thyromegaly noted. Marland Kitchen Respiratory: Clear to auscultation with no wheezes or rales.  Poor inspiratory effort. . Abdomen: Obese nontender nondistended with normal bowel sounds x4. . Musculoskeletal: Trace lower extremity edema.  2 out of 4 pulses in all 4 extremities.  Data Reviewed: CBC: Recent Labs  Lab 12/06/18 0631  12/06/18 1420 12/07/18 0544 12/08/18 0313 12/09/18 1500 12/10/18 0855  WBC 12.9*  --   --  11.2* 9.8 10.8* 13.7*  NEUTROABS  --   --   --   --   --   --  9.4*  HGB 8.6*   < > 9.2* 8.2* 8.2* 8.4* 8.1*  HCT 28.6*   < > 27.0* 26.7* 26.6* 26.8* 25.4*  MCV 89.4  --   --  89.0 89.9 87.6 86.4  PLT 171  --   --  162 179 230 231   < > = values in this interval not displayed.   Basic Metabolic Panel: Recent Labs  Lab 12/06/18 0631  12/06/18 0933 12/06/18 1420 12/07/18 0544 12/08/18 0313 12/09/18 1119 12/09/18 1500 12/10/18 0835 12/10/18 0855  NA 139   < >  --  137 139 139 136  --   --  137  K 4.3   < >  --  4.5 5.1 4.1 4.3  --   --  4.4  CL 102  --   --   --  101 99 98  --   --  99  CO2 25  --   --   --  24 22 21*  --   --  22  GLUCOSE 136*  --   --   --  128* 98 113*  --   --  87  BUN 112*  --   --   --  145* 110* 128*  --   --  85*  CREATININE 9.15*  --   --   --  11.08* 9.41* 12.05*  --   --  10.17*  CALCIUM 8.9  --   --   --  8.9 9.0 8.9  --   --  9.2  MG  --   --   --   --   --  2.7*  --  3.1*  --   --   PHOS  --   --  5.2*  --   --  6.3* 7.3* 7.4* 6.9*  --    < > = values in this interval not displayed.   GFR: Estimated Creatinine Clearance: 13.7 mL/min (A) (by C-G formula based on SCr of 10.17 mg/dL (H)). Liver Function Tests: Recent Labs  Lab 12/06/18 0631 12/09/18 1119 12/10/18 0855  AST 348*  --  36  ALT 1,167*  --  267*  ALKPHOS 101  --  91  BILITOT 1.3*  --  1.2  PROT 6.8  --  6.5  ALBUMIN 2.4* 2.7* 2.6*   No results for input(s): LIPASE, AMYLASE in the last 168  hours. No results for input(s): AMMONIA in the last 168 hours. Coagulation Profile: No results for input(s): INR, PROTIME in the last 168 hours. Cardiac Enzymes: No results for input(s): CKTOTAL, CKMB,  CKMBINDEX, TROPONINI in the last 168 hours. BNP (last 3 results) No results for input(s): PROBNP in the last 8760 hours. HbA1C: No results for input(s): HGBA1C in the last 72 hours. CBG: Recent Labs  Lab 12/10/18 1635 12/10/18 2004 12/10/18 2326 12/11/18 0417 12/11/18 0804  GLUCAP 94 135* 85 95 79   Lipid Profile: No results for input(s): CHOL, HDL, LDLCALC, TRIG, CHOLHDL, LDLDIRECT in the last 72 hours. Thyroid Function Tests: No results for input(s): TSH, T4TOTAL, FREET4, T3FREE, THYROIDAB in the last 72 hours. Anemia Panel: No results for input(s): VITAMINB12, FOLATE, FERRITIN, TIBC, IRON, RETICCTPCT in the last 72 hours. Urine analysis:    Component Value Date/Time   COLORURINE STRAW (A) 05/10/2018 1811   APPEARANCEUR CLEAR 05/10/2018 1811   LABSPEC 1.012 05/10/2018 1811   PHURINE 7.0 05/10/2018 1811   GLUCOSEU 150 (A) 05/10/2018 1811   HGBUR SMALL (A) 05/10/2018 1811   BILIRUBINUR NEGATIVE 11/03/2018 1515   KETONESUR NEGATIVE 05/10/2018 1811   PROTEINUR Positive (A) 11/03/2018 1515   PROTEINUR >=300 (A) 05/10/2018 1811   UROBILINOGEN 0.2 11/03/2018 1515   NITRITE NEGATIVE 11/03/2018 1515   NITRITE NEGATIVE 05/10/2018 1811   LEUKOCYTESUR Small (1+) (A) 11/03/2018 1515   Sepsis Labs: @LABRCNTIP (procalcitonin:4,lacticidven:4)  ) No results found for this or any previous visit (from the past 240 hour(s)).    Studies: No results found.  Scheduled Meds: . B-complex with vitamin C  1 tablet Per Tube Daily  . Chlorhexidine Gluconate Cloth  6 each Topical Q0600  . Chlorhexidine Gluconate Cloth  6 each Topical Q0600  . Chlorhexidine Gluconate Cloth  6 each Topical Q0600  . darbepoetin (ARANESP) injection - DIALYSIS  100 mcg Intravenous Q Tue-HD  . feeding  supplement (PRO-STAT SUGAR FREE 64)  30 mL Oral BID  . heparin injection (subcutaneous)  5,000 Units Subcutaneous Q8H  . insulin aspart  0-9 Units Subcutaneous Q4H  . mouth rinse  15 mL Mouth Rinse BID  . metoprolol tartrate  12.5 mg Oral BID  . senna-docusate  1 tablet Per Tube Daily  . sucroferric oxyhydroxide  2,000 mg Oral TID WC    Continuous Infusions: . sodium chloride 10 mL/hr at 12/08/18 0600  . sodium chloride 10 mL/hr at 12/07/18 0938  . sodium chloride    . sodium chloride    . ferric gluconate (FERRLECIT/NULECIT) IV Stopped (12/09/18 1555)     LOS: 11 days     Kayleen Memos, MD [facility] Pager [phone]  If 7PM-7AM, please contact night-coverage [email or web address] Password [facility] 12/11/2018, 9:44 AM

## 2018-12-12 DIAGNOSIS — L8990 Pressure ulcer of unspecified site, unspecified stage: Secondary | ICD-10-CM | POA: Insufficient documentation

## 2018-12-12 LAB — COMPREHENSIVE METABOLIC PANEL
ALT: 151 U/L — ABNORMAL HIGH (ref 0–44)
AST: 25 U/L (ref 15–41)
Albumin: 2.6 g/dL — ABNORMAL LOW (ref 3.5–5.0)
Alkaline Phosphatase: 81 U/L (ref 38–126)
Anion gap: 13 (ref 5–15)
BUN: 79 mg/dL — ABNORMAL HIGH (ref 6–20)
CO2: 25 mmol/L (ref 22–32)
Calcium: 9 mg/dL (ref 8.9–10.3)
Chloride: 95 mmol/L — ABNORMAL LOW (ref 98–111)
Creatinine, Ser: 10.55 mg/dL — ABNORMAL HIGH (ref 0.61–1.24)
GFR calc Af Amer: 6 mL/min — ABNORMAL LOW (ref >60)
GFR calc non Af Amer: 5 mL/min — ABNORMAL LOW (ref >60)
Glucose, Bld: 104 mg/dL — ABNORMAL HIGH (ref 70–99)
Potassium: 4.3 mmol/L (ref 3.5–5.1)
Sodium: 133 mmol/L — ABNORMAL LOW (ref 135–145)
Total Bilirubin: 1.1 mg/dL (ref 0.3–1.2)
Total Protein: 6.9 g/dL (ref 6.5–8.1)

## 2018-12-12 LAB — GLUCOSE, CAPILLARY
Glucose-Capillary: 109 mg/dL — ABNORMAL HIGH (ref 70–99)
Glucose-Capillary: 112 mg/dL — ABNORMAL HIGH (ref 70–99)
Glucose-Capillary: 115 mg/dL — ABNORMAL HIGH (ref 70–99)
Glucose-Capillary: 89 mg/dL (ref 70–99)
Glucose-Capillary: 97 mg/dL (ref 70–99)
Glucose-Capillary: 97 mg/dL (ref 70–99)

## 2018-12-12 MED ORDER — AMLODIPINE BESYLATE 5 MG PO TABS
5.0000 mg | ORAL_TABLET | Freq: Every day | ORAL | Status: DC
  Administered 2018-12-12 – 2018-12-13 (×2): 5 mg via ORAL
  Filled 2018-12-12 (×2): qty 1

## 2018-12-12 NOTE — Progress Notes (Signed)
Pt will call if and when he wants to be placed on CPAP

## 2018-12-12 NOTE — Plan of Care (Signed)
  Problem: Nutrition: Goal: Adequate nutrition will be maintained Outcome: Progressing   Problem: Pain Managment: Goal: General experience of comfort will improve Outcome: Progressing   Problem: Education: Goal: Knowledge of General Education information will improve Description: Including pain rating scale, medication(s)/side effects and non-pharmacologic comfort measures Outcome: Not Progressing   Problem: Health Behavior/Discharge Planning: Goal: Ability to manage health-related needs will improve Outcome: Not Progressing   Problem: Activity: Goal: Risk for activity intolerance will decrease Outcome: Not Progressing

## 2018-12-12 NOTE — Progress Notes (Signed)
PROGRESS NOTE  [name] PJA:[id number] DOB: [birth year] DOA: 11/30/2018 PCP: Luetta Nutting, DO  HPI/Recap of past 24 hours: [name] is a 43 y.o. male  with past medical history significant for OSA on CPAP, paroxysmal A. fib, hypertension, chronic diastolic CHF with EF 55 to 60% with grade 2 diastolic dysfunction, ESRD on HD Tuesday Thursday and Saturday, failed renal transplant in 2014, type 2 diabetes, hypothyroidism who presented to [facility] ED 11/30/18 with respiratory distress and hypoxic respiratory failure presumed due to acute pulmonary edema after missing HD. Also possible HCAP. Required intubation in ED due to extremis. Developed ARDS and florid septic shock requiring epinephrine drip and multiorgan failure.  Transferred to [facility] service on 12/09/2018.  12/12/18:  Patient seen and examined.  He has no complaints.  He is not on any oxygen.  Assessment/Plan: Principal Problem:   Sepsis ([place]) Active Problems:   Essential hypertension   Acute respiratory failure with hypoxia (HCC)   Acute encephalopathy   Septic shock (HCC)   Pressure injury of skin  Resolving ARDS, pulmonary edema in the setting of missed hemodialysis post extubation on 12/06/2018 Self extubated on 12/06/2018 Currently on room air and saturating more than 92%. Continue renal diet with fluid restriction.  Nephrology on board and he is getting his intermittent hemodialysis scheduled on TTS schedule.  ESRD on HD Tuesday Thursday Saturday Nephrology following.  Continue dialysis as scheduled on TTS.  Uncontrolled hypertension/tachycardia Not on antihypertensive medications at home.  He was started on Lopressor 12.5 mg here which was increased to 25 mg twice daily due to increased blood pressure.  Blood pressure still elevated.  I will start him on amlodipine 5 mg today.  Continue PRN medications.  Resolved sinus tachycardia now on beta-blocker, unclear etiology Per H&P patient uses [facility] No UDS done in  admission  OSA on CPAP CPAP nightly  Anemia of chronic disease Hemoglobin stable at 8.4 No sign of overt bleeding Aranesp per Nephrology IV iron supplement per nephrology  Resolved acute metabolic encephalopathy Continue to minimize sedation  History of noncompliance Education on the importance of compliance with hemodialysis and CPAP machine  Morbid obesity BMI 40 Recommend weight loss outpatient with healthy dieting and regular physical activity.    Physical debility/ambulatory dysfunction PT OT assessed and recommended CIR. he has been accepted for CIR pending insurance authorization.   Code Status: Full code  Family Communication: None at bedside  Disposition Plan: Discharge possibly to CIR next week when insurance authorization done.  He is medically cleared today.  Consultants:  Nephrology  Procedures:  Hemodialysis  Antimicrobials:  None  DVT prophylaxis: Subcu heparin 3 times daily   Objective: Vitals:   12/11/18 2348 12/12/18 0230 12/12/18 0612 12/12/18 0758  BP: (!) 152/96 (!) 166/87  (!) 174/99  Pulse: 97 88  95  Resp: (!) 22 18  (!) 21  Temp: 98.9 F (37.2 C) 98.7 F (37.1 C)  98.4 F (36.9 C)  TempSrc: Oral Oral  Oral  SpO2: 97% 94%    Weight:   135.5 kg   Height:   6\' 2"  (1.88 m)     Intake/Output Summary (Last 24 hours) at 12/12/2018 0843 Last data filed at 12/11/2018 1824 Gross per 24 hour  Intake -  Output 3000 ml  Net -3000 ml   Filed Weights   12/11/18 1508 12/11/18 1824 12/12/18 0612  Weight: (!) 137.8 kg 134.8 kg 135.5 kg    Exam:  General exam: Appears calm and comfortable, morbidly  obese Respiratory system: Clear to auscultation. Respiratory effort normal. Cardiovascular system: S1 & S2 heard, RRR. No JVD, murmurs, rubs, gallops or clicks. No pedal edema. Gastrointestinal system: Abdomen is nondistended, soft and nontender. No organomegaly or masses felt. Normal bowel sounds heard. Central nervous system: Alert and  oriented. No focal neurological deficits. Extremities: Symmetric 5 x 5 power. Skin: No rashes, lesions or ulcers Psychiatry: Judgement and insight appear normal. Mood & affect appropriate.   Data Reviewed: CBC: Recent Labs  Lab 12/06/18 0631  12/06/18 1420 12/07/18 0544 12/08/18 0313 12/09/18 1500 12/10/18 0855  WBC 12.9*  --   --  11.2* 9.8 10.8* 13.7*  NEUTROABS  --   --   --   --   --   --  9.4*  HGB 8.6*   < > 9.2* 8.2* 8.2* 8.4* 8.1*  HCT 28.6*   < > 27.0* 26.7* 26.6* 26.8* 25.4*  MCV 89.4  --   --  89.0 89.9 87.6 86.4  PLT 171  --   --  162 179 230 231   < > = values in this interval not displayed.   Basic Metabolic Panel: Recent Labs  Lab 12/07/18 0544 12/08/18 0313 12/09/18 1119 12/09/18 1500 12/10/18 0835 12/10/18 0855 12/11/18 1605  NA 139 139 136  --   --  137 135  K 5.1 4.1 4.3  --   --  4.4 4.6  CL 101 99 98  --   --  99 95*  CO2 24 22 21*  --   --  22 23  GLUCOSE 128* 98 113*  --   --  87 118*  BUN 145* 110* 128*  --   --  85* 103*  CREATININE 11.08* 9.41* 12.05*  --   --  10.17* 12.09*  CALCIUM 8.9 9.0 8.9  --   --  9.2 9.0  MG  --  2.7*  --  3.1*  --   --   --   PHOS  --  6.3* 7.3* 7.4* 6.9*  --  8.4*   GFR: Estimated Creatinine Clearance: 11.4 mL/min (A) (by C-G formula based on SCr of 12.09 mg/dL (H)). Liver Function Tests: Recent Labs  Lab 12/06/18 0631 12/09/18 1119 12/10/18 0855 12/11/18 1605  AST 348*  --  36  --   ALT 1,167*  --  267*  --   ALKPHOS 101  --  91  --   BILITOT 1.3*  --  1.2  --   PROT 6.8  --  6.5  --   ALBUMIN 2.4* 2.7* 2.6* 2.4*   No results for input(s): LIPASE, AMYLASE in the last 168 hours. No results for input(s): AMMONIA in the last 168 hours. Coagulation Profile: No results for input(s): INR, PROTIME in the last 168 hours. Cardiac Enzymes: No results for input(s): CKTOTAL, CKMB, CKMBINDEX, TROPONINI in the last 168 hours. BNP (last 3 results) No results for input(s): PROBNP in the last 8760 hours. HbA1C:  No results for input(s): HGBA1C in the last 72 hours. CBG: Recent Labs  Lab 12/11/18 1443 12/11/18 1852 12/11/18 2347 12/12/18 0233 12/12/18 0735  GLUCAP 109* 89 109* 109* 97   Lipid Profile: No results for input(s): CHOL, HDL, LDLCALC, TRIG, CHOLHDL, LDLDIRECT in the last 72 hours. Thyroid Function Tests: No results for input(s): TSH, T4TOTAL, FREET4, T3FREE, THYROIDAB in the last 72 hours. Anemia Panel: No results for input(s): VITAMINB12, FOLATE, FERRITIN, TIBC, IRON, RETICCTPCT in the last 72 hours. Urine analysis:    Component  Value Date/Time   COLORURINE STRAW (A) 05/10/2018 1811   APPEARANCEUR CLEAR 05/10/2018 1811   LABSPEC 1.012 05/10/2018 1811   PHURINE 7.0 05/10/2018 1811   GLUCOSEU 150 (A) 05/10/2018 1811   HGBUR SMALL (A) 05/10/2018 1811   BILIRUBINUR NEGATIVE 11/03/2018 1515   KETONESUR NEGATIVE 05/10/2018 1811   PROTEINUR Positive (A) 11/03/2018 1515   PROTEINUR >=300 (A) 05/10/2018 1811   UROBILINOGEN 0.2 11/03/2018 1515   NITRITE NEGATIVE 11/03/2018 1515   NITRITE NEGATIVE 05/10/2018 1811   LEUKOCYTESUR Small (1+) (A) 11/03/2018 1515   Sepsis Labs: @LABRCNTIP (procalcitonin:4,lacticidven:4)  ) No results found for this or any previous visit (from the past 240 hour(s)).    Studies: No results found.  Scheduled Meds: . B-complex with vitamin C  1 tablet Per Tube Daily  . Chlorhexidine Gluconate Cloth  6 each Topical Q0600  . Chlorhexidine Gluconate Cloth  6 each Topical Q0600  . Chlorhexidine Gluconate Cloth  6 each Topical Q0600  . darbepoetin (ARANESP) injection - DIALYSIS  100 mcg Intravenous Q Tue-HD  . feeding supplement (PRO-STAT SUGAR FREE 64)  30 mL Oral BID  . heparin injection (subcutaneous)  5,000 Units Subcutaneous Q8H  . insulin aspart  0-9 Units Subcutaneous Q4H  . mouth rinse  15 mL Mouth Rinse BID  . metoprolol tartrate  25 mg Oral BID  . senna-docusate  1 tablet Per Tube Daily  . sucroferric oxyhydroxide  2,000 mg Oral TID WC     Continuous Infusions: . sodium chloride 10 mL/hr at 12/08/18 0600  . sodium chloride 10 mL/hr at 12/07/18 0938  . ferric gluconate (FERRLECIT/NULECIT) IV Stopped (12/11/18 1905)     LOS: 12 days     Darliss Cheney, MD [facility] Pager [phone]  If 7PM-7AM, please contact night-coverage [email or web address] Password [facility] 12/12/2018, 8:43 AM

## 2018-12-12 NOTE — Progress Notes (Signed)
[facility] ROUNDING NOTE   Subjective:   This is a 43 year old gentleman was admitted with pulmonary edema and acute hypoxic respiratory failure with aspiration and shock requiring a brief treatment with CRRT 12/01/2018 - 12/03/2018.  End-stage renal disease Tuesday Thursday Saturday dialysis  He has a history of failed renal transplant 10/2012 followed by [facility], not on any immunosuppressant drugs.  He has a history of hypertension diabetes mellitus type 2 and congestive heart failure with morbid obesity and obstructive sleep apnea on CPAP.  He also has a history of gastroesophageal reflux disease.  Patient appears to be much better he is calm.  He states he feels weak this morning.  Last dialysis treatment 12/11/2018 with removal of 3 L  Blood pressure 166/87 pulse 88 temperature 98.7 O2 sats 99% room air Sodium 135 potassium 4.6 chloride 95 CO2 23 BUN 103 creatinine 12 glucose 118 calcium 9.0 phosphorus 8.4 albumin 2.4.  WBC 13.7 hemoglobin 8.1 platelets 231 Last T sats 19% 12/06/2018   pro-stat 60 cc 3 times daily, insulin sliding scale, Velphoro 2 g 3 times daily with meals, darbepoetin 100 mcg q. Tuesday      IV ferric gluconate 125 mg Tuesday Thursday Saturday.  Lopressor 25 mg twice daily  Feeding supplement vital AF 1 L at 40 cc an hour  Chest x-ray 12/06/2018 endotracheal tube and NG tube in stable position left IJ stable cardiomegaly  Ultrasound abdomen 12/02/2018 echogenicity of liver no other acute pathology unremarkable globe gallbladder with no evidence of dilation  2D echo EF 60 to 65% 12/01/2018   Objective:  Vital signs in last 24 hours:  Temp:  [97.5 F (36.4 C)-98.9 F (37.2 C)] 98.7 F (37.1 C) (07/05 0230) Pulse Rate:  [80-107] 88 (07/05 0230) Resp:  [9-34] 18 (07/05 0230) BP: (124-176)/(82-99) 166/87 (07/05 0230) SpO2:  [93 %-97 %] 94 % (07/05 0230) Weight:  [134.8 kg-137.8 kg] 135.5 kg (07/05 0612)  Weight change: 0.4 kg Filed Weights   12/11/18 1508  12/11/18 1824 12/12/18 0612  Weight: (!) 137.8 kg 134.8 kg 135.5 kg    Intake/Output: I/O last 3 completed shifts: In: -  Out: 3000 [Other:3000]   Intake/Output this shift:  No intake/output data recorded.   Agitated CVS- RRR no JVP faint systolic murmur RS-some fine inspiratory crackles heard at the bases ABD- BS present soft non-distended EXT-1+ peripheral edema   Basic Metabolic Panel: Recent Labs  Lab 12/07/18 0544 12/08/18 0313 12/09/18 1119 12/09/18 1500 12/10/18 0835 12/10/18 0855 12/11/18 1605  NA 139 139 136  --   --  137 135  K 5.1 4.1 4.3  --   --  4.4 4.6  CL 101 99 98  --   --  99 95*  CO2 24 22 21*  --   --  22 23  GLUCOSE 128* 98 113*  --   --  87 118*  BUN 145* 110* 128*  --   --  85* 103*  CREATININE 11.08* 9.41* 12.05*  --   --  10.17* 12.09*  CALCIUM 8.9 9.0 8.9  --   --  9.2 9.0  MG  --  2.7*  --  3.1*  --   --   --   PHOS  --  6.3* 7.3* 7.4* 6.9*  --  8.4*    Liver Function Tests: Recent Labs  Lab 12/06/18 0631 12/09/18 1119 12/10/18 0855 12/11/18 1605  AST 348*  --  36  --   ALT 1,167*  --  267*  --   ALKPHOS 101  --  91  --   BILITOT 1.3*  --  1.2  --   PROT 6.8  --  6.5  --   ALBUMIN 2.4* 2.7* 2.6* 2.4*   No results for input(s): LIPASE, AMYLASE in the last 168 hours. No results for input(s): AMMONIA in the last 168 hours.  CBC: Recent Labs  Lab 12/06/18 0631  12/06/18 1420 12/07/18 0544 12/08/18 0313 12/09/18 1500 12/10/18 0855  WBC 12.9*  --   --  11.2* 9.8 10.8* 13.7*  NEUTROABS  --   --   --   --   --   --  9.4*  HGB 8.6*   < > 9.2* 8.2* 8.2* 8.4* 8.1*  HCT 28.6*   < > 27.0* 26.7* 26.6* 26.8* 25.4*  MCV 89.4  --   --  89.0 89.9 87.6 86.4  PLT 171  --   --  162 179 230 231   < > = values in this interval not displayed.    Cardiac Enzymes: No results for input(s): CKTOTAL, CKMB, CKMBINDEX, TROPONINI in the last 168 hours.  BNP: Invalid input(s): POCBNP  CBG: Recent Labs  Lab 12/11/18 1230 12/11/18 1443  12/11/18 1852 12/11/18 2347 12/12/18 0233  GLUCAP 80 109* 89 109* 109*    Microbiology: Results for orders placed or performed during the hospital encounter of 11/30/18  SARS Coronavirus 2 (CEPHEID- Performed in [place] hospital lab), Hosp Order     Status: None   Collection Time: 11/30/18  8:35 AM   Specimen: Nasopharyngeal Swab  Result Value Ref Range Status   SARS Coronavirus 2 NEGATIVE NEGATIVE Final    Comment: (NOTE) If result is NEGATIVE SARS-CoV-2 target nucleic acids are NOT DETECTED. The SARS-CoV-2 RNA is generally detectable in upper and lower  respiratory specimens during the acute phase of infection. The lowest  concentration of SARS-CoV-2 viral copies this assay can detect is 250  copies / mL. A negative result does not preclude SARS-CoV-2 infection  and should not be used as the sole basis for treatment or other  patient management decisions.  A negative result may occur with  improper specimen collection / handling, submission of specimen other  than nasopharyngeal swab, presence of viral mutation(s) within the  areas targeted by this assay, and inadequate number of viral copies  (<250 copies / mL). A negative result must be combined with clinical  observations, patient history, and epidemiological information. If result is POSITIVE SARS-CoV-2 target nucleic acids are DETECTED. The SARS-CoV-2 RNA is generally detectable in upper and lower  respiratory specimens dur ing the acute phase of infection.  Positive  results are indicative of active infection with SARS-CoV-2.  Clinical  correlation with patient history and other diagnostic information is  necessary to determine patient infection status.  Positive results do  not rule out bacterial infection or co-infection with other viruses. If result is PRESUMPTIVE POSTIVE SARS-CoV-2 nucleic acids MAY BE PRESENT.   A presumptive positive result was obtained on the submitted specimen  and confirmed on repeat  testing.  While 2019 novel coronavirus  (SARS-CoV-2) nucleic acids may be present in the submitted sample  additional confirmatory testing may be necessary for epidemiological  and / or clinical management purposes  to differentiate between  SARS-CoV-2 and other Sarbecovirus currently known to infect humans.  If clinically indicated additional testing with an alternate test  methodology [phone]) is advised. The SARS-CoV-2 RNA is generally  detectable in upper and  lower respiratory sp ecimens during the acute  phase of infection. The expected result is Negative. Fact Sheet for Patients:  StrictlyIdeas.no Fact Sheet for Healthcare Providers: BankingDealers.co.za This test is not yet approved or cleared by the [place] FDA and has been authorized for detection and/or diagnosis of SARS-CoV-2 by FDA under an Emergency Use Authorization (EUA).  This EUA will remain in effect (meaning this test can be used) for the duration of the COVID-19 declaration under Section 564(b)(1) of the Act, 21 U.S.C. section 360bbb-3(b)(1), unless the authorization is terminated or revoked sooner. Performed at [facility], [place] [address] [place], [place] [address]   Blood culture (routine x 2)     Status: None   Collection Time: 11/30/18  8:57 AM   Specimen: BLOOD RIGHT HAND  Result Value Ref Range Status   Specimen Description BLOOD RIGHT HAND  Final   Special Requests   Final    BOTTLES DRAWN AEROBIC AND ANAEROBIC Blood Culture adequate volume   Culture   Final    NO GROWTH 5 DAYS Performed at [facility], [place] [address] [place], [place] [address]    Report Status 12/05/2018 FINAL  Final  Culture, blood (routine x 2)     Status: None   Collection Time: 11/30/18  5:10 PM   Specimen: BLOOD LEFT HAND  Result Value Ref Range Status   Specimen Description BLOOD LEFT HAND  Final   Special Requests   Final    BOTTLES DRAWN AEROBIC ONLY  Blood Culture results may not be optimal due to an inadequate volume of blood received in culture bottles   Culture   Final    NO GROWTH 5 DAYS Performed at [facility], [place] [address], [place], [place] [address]    Report Status 12/05/2018 FINAL  Final  MRSA PCR Screening     Status: None   Collection Time: 11/30/18  5:20 PM   Specimen: Nasal Mucosa; Nasopharyngeal  Result Value Ref Range Status   MRSA by PCR NEGATIVE NEGATIVE Final    Comment:        The GeneXpert MRSA Assay (FDA approved for NASAL specimens only), is one component of a comprehensive MRSA colonization surveillance program. It is not intended to diagnose MRSA infection nor to guide or monitor treatment for MRSA infections. Performed at [facility], [place] [address], [place], [place] [address]   Culture, respiratory (tracheal aspirate)     Status: None   Collection Time: 12/01/18  3:16 AM   Specimen: Tracheal Aspirate; Respiratory  Result Value Ref Range Status   Specimen Description TRACHEAL ASPIRATE  Final   Special Requests NONE  Final   Gram Stain   Final    RARE WBC PRESENT,BOTH PMN AND MONONUCLEAR NO ORGANISMS SEEN    Culture   Final    NO GROWTH 2 DAYS Performed at [facility], [address], [place], [place] [address]    Report Status 12/03/2018 FINAL  Final    Coagulation Studies: No results for input(s): LABPROT, INR in the last 72 hours.  Urinalysis: No results for input(s): COLORURINE, LABSPEC, PHURINE, GLUCOSEU, HGBUR, BILIRUBINUR, KETONESUR, PROTEINUR, UROBILINOGEN, NITRITE, LEUKOCYTESUR in the last 72 hours.  Invalid input(s): APPERANCEUR    Imaging: No results found.   Medications:   . sodium chloride 10 mL/hr at 12/08/18 0600  . sodium chloride 10 mL/hr at 12/07/18 0938  . ferric gluconate (FERRLECIT/NULECIT) IV Stopped (12/11/18 1905)   . B-complex  with vitamin C  1 tablet Per Tube Daily  . Chlorhexidine Gluconate Cloth  6 each Topical Q0600  .  Chlorhexidine Gluconate Cloth  6 each Topical Q0600  . Chlorhexidine Gluconate Cloth  6 each Topical Q0600  . darbepoetin (ARANESP) injection - DIALYSIS  100 mcg Intravenous Q Tue-HD  . feeding supplement (PRO-STAT SUGAR FREE 64)  30 mL Oral BID  . heparin injection (subcutaneous)  5,000 Units Subcutaneous Q8H  . insulin aspart  0-9 Units Subcutaneous Q4H  . mouth rinse  15 mL Mouth Rinse BID  . metoprolol tartrate  25 mg Oral BID  . senna-docusate  1 tablet Per Tube Daily  . sucroferric oxyhydroxide  2,000 mg Oral TID WC   sodium chloride, acetaminophen **OR** acetaminophen, albuterol, bisacodyl, haloperidol lactate, labetalol, ondansetron **OR** ondansetron (ZOFRAN) IV  Assessment/ Plan:   ESRD-Tuesday Thursday Saturday dialysis.  Did receive dialysis Sunday due to high volume Saturday.  he received a brief period of CRRT on admission due to hypotension and shock.  Underwent successful dialysis 12/11/2018 with ultrafiltration of 3 L.  Next dialysis treatment will be scheduled for 12/14/2018  Anemia appears stable .  Darbepoetin 100 mcg q. Thursday, T sats 19% patient is receiving IV iron 3 times a week on dialysis  Bones last PTH 12/06/2018 was 136 continues on Velphoro 1 g 3 times daily with meals.  We will increase Velphoro to 2 g with meals.  Hypotension/shock off pressors appears to have resolved appears to tolerate fluid removal with dialysis.  Noted increase in Lopressor to 25 mg twice daily.  Continue to challenge dry weight in order to lower blood pressure.  Ventilator dependent respiratory failure extubated on BiPAP and high flow nasal cannula  Diastolic dysfunction stable  Diabetes mellitus insulin sliding scale  Malnutrition continues on Protostat feeding supplements  History of paroxysmal atrial fibrillation .    LOS: [place] @TODAY @7 :35 AM

## 2018-12-13 ENCOUNTER — Encounter (HOSPITAL_COMMUNITY): Payer: Self-pay

## 2018-12-13 ENCOUNTER — Inpatient Hospital Stay (HOSPITAL_COMMUNITY)
Admission: RE | Admit: 2018-12-13 | Discharge: 2018-12-17 | DRG: 945 | Disposition: A | Discharge status: Home / Self Care | Payer: 59 | Source: Intra-hospital | Attending: Physical Medicine & Rehabilitation | Admitting: Physical Medicine & Rehabilitation

## 2018-12-13 ENCOUNTER — Other Ambulatory Visit: Payer: Self-pay

## 2018-12-13 DIAGNOSIS — R7309 Other abnormal glucose: Secondary | ICD-10-CM

## 2018-12-13 DIAGNOSIS — Z992 Dependence on renal dialysis: Secondary | ICD-10-CM

## 2018-12-13 DIAGNOSIS — Z9119 Patient's noncompliance with other medical treatment and regimen: Secondary | ICD-10-CM

## 2018-12-13 DIAGNOSIS — S31809A Unspecified open wound of unspecified buttock, initial encounter: Secondary | ICD-10-CM | POA: Diagnosis present

## 2018-12-13 DIAGNOSIS — Z833 Family history of diabetes mellitus: Secondary | ICD-10-CM | POA: Diagnosis not present

## 2018-12-13 DIAGNOSIS — N2581 Secondary hyperparathyroidism of renal origin: Secondary | ICD-10-CM | POA: Diagnosis present

## 2018-12-13 DIAGNOSIS — I5032 Chronic diastolic (congestive) heart failure: Secondary | ICD-10-CM | POA: Diagnosis present

## 2018-12-13 DIAGNOSIS — D638 Anemia in other chronic diseases classified elsewhere: Secondary | ICD-10-CM

## 2018-12-13 DIAGNOSIS — R5381 Other malaise: Principal | ICD-10-CM | POA: Diagnosis present

## 2018-12-13 DIAGNOSIS — J811 Chronic pulmonary edema: Secondary | ICD-10-CM | POA: Diagnosis not present

## 2018-12-13 DIAGNOSIS — E1169 Type 2 diabetes mellitus with other specified complication: Secondary | ICD-10-CM

## 2018-12-13 DIAGNOSIS — E039 Hypothyroidism, unspecified: Secondary | ICD-10-CM | POA: Diagnosis present

## 2018-12-13 DIAGNOSIS — Z940 Kidney transplant status: Secondary | ICD-10-CM

## 2018-12-13 DIAGNOSIS — Z6839 Body mass index (BMI) 39.0-39.9, adult: Secondary | ICD-10-CM | POA: Diagnosis not present

## 2018-12-13 DIAGNOSIS — D631 Anemia in chronic kidney disease: Secondary | ICD-10-CM | POA: Diagnosis present

## 2018-12-13 DIAGNOSIS — Z91199 Patient's noncompliance with other medical treatment and regimen due to unspecified reason: Secondary | ICD-10-CM

## 2018-12-13 DIAGNOSIS — I132 Hypertensive heart and chronic kidney disease with heart failure and with stage 5 chronic kidney disease, or end stage renal disease: Secondary | ICD-10-CM | POA: Diagnosis present

## 2018-12-13 DIAGNOSIS — E1122 Type 2 diabetes mellitus with diabetic chronic kidney disease: Secondary | ICD-10-CM | POA: Diagnosis present

## 2018-12-13 DIAGNOSIS — Z79899 Other long term (current) drug therapy: Secondary | ICD-10-CM | POA: Diagnosis not present

## 2018-12-13 DIAGNOSIS — X58XXXA Exposure to other specified factors, initial encounter: Secondary | ICD-10-CM | POA: Diagnosis present

## 2018-12-13 DIAGNOSIS — J80 Acute respiratory distress syndrome: Secondary | ICD-10-CM | POA: Diagnosis not present

## 2018-12-13 DIAGNOSIS — G4733 Obstructive sleep apnea (adult) (pediatric): Secondary | ICD-10-CM | POA: Diagnosis present

## 2018-12-13 DIAGNOSIS — I480 Paroxysmal atrial fibrillation: Secondary | ICD-10-CM | POA: Diagnosis present

## 2018-12-13 DIAGNOSIS — N186 End stage renal disease: Secondary | ICD-10-CM | POA: Diagnosis present

## 2018-12-13 DIAGNOSIS — Z823 Family history of stroke: Secondary | ICD-10-CM | POA: Diagnosis not present

## 2018-12-13 DIAGNOSIS — E669 Obesity, unspecified: Secondary | ICD-10-CM

## 2018-12-13 DIAGNOSIS — K219 Gastro-esophageal reflux disease without esophagitis: Secondary | ICD-10-CM | POA: Diagnosis present

## 2018-12-13 DIAGNOSIS — G9341 Metabolic encephalopathy: Secondary | ICD-10-CM | POA: Diagnosis not present

## 2018-12-13 LAB — CBC WITH DIFFERENTIAL/PLATELET
Abs Immature Granulocytes: 0.1 10*3/uL — ABNORMAL HIGH (ref 0.00–0.07)
Basophils Absolute: 0.1 10*3/uL (ref 0.0–0.1)
Basophils Relative: 1 %
Eosinophils Absolute: 0.5 10*3/uL (ref 0.0–0.5)
Eosinophils Relative: 5 %
HCT: 26.9 % — ABNORMAL LOW (ref 39.0–52.0)
Hemoglobin: 8.2 g/dL — ABNORMAL LOW (ref 13.0–17.0)
Immature Granulocytes: 1 %
Lymphocytes Relative: 13 %
Lymphs Abs: 1.3 10*3/uL (ref 0.7–4.0)
MCH: 26.8 pg (ref 26.0–34.0)
MCHC: 30.5 g/dL (ref 30.0–36.0)
MCV: 87.9 fL (ref 80.0–100.0)
Monocytes Absolute: 1.7 10*3/uL — ABNORMAL HIGH (ref 0.1–1.0)
Monocytes Relative: 17 %
Neutro Abs: 6.4 10*3/uL (ref 1.7–7.7)
Neutrophils Relative %: 63 %
Platelets: 327 10*3/uL (ref 150–400)
RBC: 3.06 MIL/uL — ABNORMAL LOW (ref 4.22–5.81)
RDW: 19.2 % — ABNORMAL HIGH (ref 11.5–15.5)
WBC: 10 10*3/uL (ref 4.0–10.5)
nRBC: 0 % (ref 0.0–0.2)

## 2018-12-13 LAB — MAGNESIUM: Magnesium: 2.8 mg/dL — ABNORMAL HIGH (ref 1.7–2.4)

## 2018-12-13 LAB — GLUCOSE, CAPILLARY
Glucose-Capillary: 95 mg/dL (ref 70–99)
Glucose-Capillary: 71 mg/dL (ref 70–99)
Glucose-Capillary: 94 mg/dL (ref 70–99)
Glucose-Capillary: 95 mg/dL (ref 70–99)
Glucose-Capillary: 96 mg/dL (ref 70–99)

## 2018-12-13 MED ORDER — HEPARIN SODIUM (PORCINE) 1000 UNIT/ML DIALYSIS
5000.0000 [IU] | INTRAMUSCULAR | Status: DC | PRN
  Filled 2018-12-13: qty 5

## 2018-12-13 MED ORDER — ACETAMINOPHEN 325 MG PO TABS: 650.0000 mg | ORAL_TABLET | Freq: Four times a day (QID) | ORAL | Status: DC | PRN

## 2018-12-13 MED ORDER — ACETAMINOPHEN 650 MG RE SUPP: 650.0000 mg | Freq: Four times a day (QID) | RECTAL | Status: DC | PRN

## 2018-12-13 MED ORDER — METOPROLOL TARTRATE 25 MG PO TABS
25.0000 mg | ORAL_TABLET | Freq: Two times a day (BID) | ORAL | Status: DC
  Administered 2018-12-13 – 2018-12-17 (×6): 25 mg via ORAL
  Filled 2018-12-13 (×7): qty 1

## 2018-12-13 MED ORDER — SENNOSIDES-DOCUSATE SODIUM 8.6-50 MG PO TABS
1.0000 | ORAL_TABLET | Freq: Every day | ORAL | Status: DC
  Administered 2018-12-14 – 2018-12-17 (×4): 1
  Filled 2018-12-13 (×4): qty 1

## 2018-12-13 MED ORDER — COLLAGENASE 250 UNIT/GM EX OINT
TOPICAL_OINTMENT | Freq: Every day | CUTANEOUS | Status: DC
  Administered 2018-12-13: 13:00:00 via TOPICAL
  Filled 2018-12-13: qty 30

## 2018-12-13 MED ORDER — HEPARIN SODIUM (PORCINE) 5000 UNIT/ML IJ SOLN: 5000.0000 [IU] | Freq: Three times a day (TID) | INTRAMUSCULAR | Status: DC

## 2018-12-13 MED ORDER — ALBUTEROL SULFATE (2.5 MG/3ML) 0.083% IN NEBU: 2.5000 mg | INHALATION_SOLUTION | RESPIRATORY_TRACT | Status: DC | PRN

## 2018-12-13 MED ORDER — ONDANSETRON HCL 4 MG PO TABS: 4.0000 mg | ORAL_TABLET | Freq: Four times a day (QID) | ORAL | Status: DC | PRN

## 2018-12-13 MED ORDER — HEPARIN SODIUM (PORCINE) 5000 UNIT/ML IJ SOLN
5000.0000 [IU] | Freq: Three times a day (TID) | INTRAMUSCULAR | Status: DC
  Administered 2018-12-13 – 2018-12-17 (×9): 5000 [IU] via SUBCUTANEOUS
  Filled 2018-12-13 (×9): qty 1

## 2018-12-13 MED ORDER — PRO-STAT SUGAR FREE PO LIQD
30.0000 mL | Freq: Two times a day (BID) | ORAL | Status: DC
  Administered 2018-12-13 – 2018-12-17 (×8): 30 mL via ORAL
  Filled 2018-12-13 (×8): qty 30

## 2018-12-13 MED ORDER — AMLODIPINE BESYLATE 5 MG PO TABS: 5.0000 mg | ORAL_TABLET | Freq: Every day | ORAL | 0 refills | Status: DC

## 2018-12-13 MED ORDER — INSULIN ASPART 100 UNIT/ML ~~LOC~~ SOLN: 0.0000 [IU] | SUBCUTANEOUS | Status: DC

## 2018-12-13 MED ORDER — CHLORHEXIDINE GLUCONATE CLOTH 2 % EX PADS: 6.0000 | MEDICATED_PAD | Freq: Every day | CUTANEOUS | Status: DC

## 2018-12-13 MED ORDER — ONDANSETRON HCL 4 MG/2ML IJ SOLN: 4.0000 mg | Freq: Four times a day (QID) | INTRAMUSCULAR | Status: DC | PRN

## 2018-12-13 MED ORDER — SORBITOL 70 % SOLN: 30.0000 mL | Freq: Every day | Status: DC | PRN

## 2018-12-13 MED ORDER — B COMPLEX-C PO TABS
1.0000 | ORAL_TABLET | Freq: Every day | ORAL | Status: DC
  Administered 2018-12-14: 1
  Filled 2018-12-13: qty 1

## 2018-12-13 MED ORDER — METOPROLOL TARTRATE 25 MG PO TABS: 25.0000 mg | ORAL_TABLET | Freq: Two times a day (BID) | ORAL | 0 refills | Status: DC

## 2018-12-13 MED ORDER — SUCROFERRIC OXYHYDROXIDE 500 MG PO CHEW
2000.0000 mg | CHEWABLE_TABLET | Freq: Three times a day (TID) | ORAL | Status: DC
  Administered 2018-12-14 – 2018-12-17 (×8): 2000 mg via ORAL
  Filled 2018-12-13 (×12): qty 4

## 2018-12-13 MED ORDER — BISACODYL 10 MG RE SUPP: 10.0000 mg | Freq: Every day | RECTAL | Status: DC | PRN

## 2018-12-13 MED ORDER — DARBEPOETIN ALFA 100 MCG/0.5ML IJ SOSY
100.0000 ug | PREFILLED_SYRINGE | INTRAMUSCULAR | Status: DC
  Filled 2018-12-13: qty 0.5

## 2018-12-13 MED ORDER — AMLODIPINE BESYLATE 5 MG PO TABS
5.0000 mg | ORAL_TABLET | Freq: Every day | ORAL | Status: DC
  Administered 2018-12-15 – 2018-12-17 (×2): 5 mg via ORAL
  Filled 2018-12-13 (×3): qty 1

## 2018-12-13 MED ORDER — COLLAGENASE 250 UNIT/GM EX OINT
TOPICAL_OINTMENT | Freq: Every day | CUTANEOUS | Status: DC
  Administered 2018-12-14 – 2018-12-17 (×6): via TOPICAL
  Filled 2018-12-13 (×3): qty 30

## 2018-12-13 NOTE — Consult Note (Signed)
[facility] Nurse wound consult note Reason for Consult: perineum  Wound type: moisture vs pressure Pressure Injury POA: Yes/No/NA Measurement: 11cm x 2cm x 0.1cm  Wound bed:90% yellow/soft grey/10% pink  Drainage (amount, consistency, odor) minimal with slight odor, yellow Periwound:intact  Dressing procedure/placement/frequency: Enzymatic debridement to the gluteal cleft wound, apply daily.  ABD pad deep into the cleft to absorb moisture.    [place] Nurse team will follow with you and see patient within 10 days for wound assessments.  Please notify [facility] nurses of any acute changes in the wounds or any new areas of concern [place] MSN, [place], [place], [place]

## 2018-12-13 NOTE — Progress Notes (Signed)
Patient placed on a CPAP of 6 since patient states this is his home settings. Patient is tolerating well; will continue to monitor.

## 2018-12-13 NOTE — Progress Notes (Signed)
Inpatient Rehabilitation Admissions Coordinator  I met with patient at bedside. He remains on CPAP this am for he is sleeping. 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 I await [place] determination for a possible inpt rehab admission today. RN is made aware.  Danne Baxter, RN, MSN Rehab Admissions Coordinator [phone] 12/13/2018 11:01 AM

## 2018-12-13 NOTE — Progress Notes (Signed)
Inpatient Rehabilitation Admissions Coordinator  I have received approval form [facility] for CIR admit. I contacted Dr. Doristine Bosworth for d/c order and RN I aware. I will make the arrangements to admit today. I contacted hemodialysis, [facility], and they are aware of admit to CIR.  Danne Baxter, RN, MSN Rehab Admissions Coordinator [phone] 12/13/2018 4:28 PM

## 2018-12-13 NOTE — PMR Pre-admission (Signed)
PMR Admission Coordinator Pre-Admission Assessment  Patient: [name] is an 43 y.o., male MRN: [id number] DOB: 1975/10/17 Height: _0  (188 cm) Weight: 135.5 kg  Insurance Information HMO:   PPO: yes     PCP:      IPA:      80/20:      OTHER:  PRIMARY: [place]      Policy#: [id number]      Subscriber: pt CM Name: Andee Poles      Phone#: [phone]     Fax#: [phone] Pre-Cert#: [id number] for 7 days with f/u Dorthula Nettles  Phone [phone] fax [phone]    Employer: [employer] Benefits:  Phone #: [phone]     Name:  Eff. Date: 06/09/2018     Deduct: $2000      Out of Pocket Max: $4000      Life Max: none CIR: 80%      SNF: 80% Outpatient: $25 to $50 co pay per visit     Co-Pay: 25 visits each discipline Home Health: 80%      Co-Pay: 120 visits per year DME: 80%     Co-Pay: 20% Providers: in network  SECONDARY: none       Medicaid Application Date:       Case Manager:  Disability Application Date:       Case Worker:   The "Data Collection Information Summary" for patients in Inpatient Rehabilitation Facilities with attached "Privacy Act [place] Records" was provided and verbally reviewed with: N/A  Emergency Contact Information Contact Information    Name Relation Home Work Mobile   [place] Father [place] Mother [phone]        Current Medical History  Patient Admitting Diagnosis: debility  History of Present Illness: 43 year old right-handed male with history of end-stage renal disease with hemodialysis status post kidney transplant in May 2014 followed at [facility], poorly controlled hypertension, diabetes mellitus, diastolic congestive heart failure, morbid obesity with BMI of 40, obstructive sleep apnea on CPAP.   Presented 11/30/2018 with increasing shortness of breath including chest discomfort and cough after missing one outpt dialysis treatment for going out of town for  the weekend. He did have episode of emesis that was non bloody.  Noted low-grade fever 100.4, blood pressure 214/122, oxygen saturation 72%, hemoglobin 8.1, creatinine 15.23, lactic acid 1.2, COVID screening negative, urine drug screen negative.  Chest x-ray showed areas of aspects of opacity thought to represent pneumonia in the right base and left upper lobes.  Patient did require intubation and self extubated 12/06/2018.  Oxygen saturations 92% on room air.  Patient initially with bouts of delirium did receive Haldol.  Hemodialysis ongoing as per renal services.  Hospital course bouts of sinus tachycardia resolved with low-dose beta-blocker.  Subcutaneous heparin for DVT prophylaxis.  Tolerating a regular diet.    Patient's medical record from [facility]  has been reviewed by the rehabilitation admission coordinator and physician.  Past Medical History  Past Medical History:  Diagnosis Date  . Anemia    ESRD  . CHF (congestive heart failure) ([place])   . Diabetes mellitus without complication ([place])   . ESRD on hemodialysis ([place]) 06/07/2011   TTS [employer]. Started HD in 2006, got transplant in 2014 lasted until Dec 2019 then went back on HD.  Has L thigh AVG as of Jun 2020.  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   Marland Kitchen GERD (gastroesophageal reflux disease)   .  Hypertension 111/29/2012   pt states he takes no HTN meds  . Hypothyroidism, secondary   . Morbid obesity ([place])   . Paroxysmal atrial fibrillation (H[facility])   . Sleep apnea 06/07/2011   Pt had sllep study done 2 weeks ago- Dr. Joellyn Quails.  Does not  have a CPAP at this time.    Family History   family history includes Diabetes in his father; Stroke in his father.  Prior Rehab/Hospitalizations Has the patient had prior rehab or hospitalizations prior to admission? Yes  Has the patient had major surgery during 100 days prior to admission? No   Current Medications  Current Facility-Administered Medications:  .  0.9  %  sodium chloride infusion, , Intravenous, Continuous, Mannam, Praveen, MD, Last Rate: 10 mL/hr at 12/08/18 0600 .  0.9 %  sodium chloride infusion, , Intravenous, PRN, Rigoberto Noel, MD, Last Rate: 10 mL/hr at 12/07/18 0938 .  acetaminophen (TYLENOL) tablet 650 mg, 650 mg, Oral, Q6H PRN, 650 mg at 12/05/18 2022 **OR** acetaminophen (TYLENOL) suppository 650 mg, 650 mg, Rectal, Q6H PRN, Smith, Rondell A, MD .  albuterol (PROVENTIL) (2.5 MG/3ML) 0.083% nebulizer solution 2.5 mg, 2.5 mg, Nebulization, Q4H PRN, Smith, Rondell A, MD .  amLODipine (NORVASC) tablet 5 mg, 5 mg, Oral, Daily, Pahwani, Ravi, MD, 5 mg at 12/13/18 1010 .  B-complex with vitamin C tablet 1 tablet, 1 tablet, Per Tube, Daily, Bowser, Laurel Dimmer, NP, 1 tablet at 12/13/18 1010 .  bisacodyl (DULCOLAX) suppository 10 mg, 10 mg, Rectal, Daily PRN, Bowser, Laurel Dimmer, NP, 10 mg at 12/05/18 1808 .  Chlorhexidine Gluconate Cloth 2 % PADS 6 each, 6 each, Topical, Q0600, Edrick Oh, MD, 6 each at 12/13/18 [phone] .  Chlorhexidine Gluconate Cloth 2 % PADS 6 each, 6 each, Topical, Q0600, Roney Jaffe, MD .  collagenase (SANTYL) ointment, , Topical, Daily, Pahwani, Ravi, MD .  Darbepoetin Alfa (ARANESP) injection 100 mcg, 100 mcg, Intravenous, Samuel Germany, MD, 100 mcg at 12/07/18 1341 .  feeding supplement (PRO-STAT SUGAR FREE 64) liquid 30 mL, 30 mL, Oral, BID, Hall, Carole N, DO, 30 mL at 12/13/18 1010 .  ferric gluconate (NULECIT) 125 mg in sodium chloride 0.9 % 100 mL IVPB, 125 mg, Intravenous, Q T,Th,Sa-HD, Edrick Oh, MD, Stopped at 12/11/18 1905 .  haloperidol lactate (HALDOL) injection 1-4 mg, 1-4 mg, Intravenous, Q3H PRN, Rigoberto Noel, MD, 3 mg at 12/06/18 1615 .  heparin injection 5,000 Units, 5,000 Units, Subcutaneous, Q8H, Minus Breeding, MD, 5,000 Units at 12/13/18 1325 .  insulin aspart (novoLOG) injection 0-9 Units, 0-9 Units, Subcutaneous, Q4H, Bowser, Laurel Dimmer, NP, 1 Units at 12/10/18 2016 .  labetalol (NORMODYNE)  injection 10 mg, 10 mg, Intravenous, Q6H PRN, Kayleen Memos, DO, 10 mg at 12/09/18 1907 .  MEDLINE mouth rinse, 15 mL, Mouth Rinse, BID, Rigoberto Noel, MD, 15 mL at 12/12/18 0956 .  metoprolol tartrate (LOPRESSOR) tablet 25 mg, 25 mg, Oral, BID, Hall, Carole N, DO, 25 mg at 12/13/18 1010 .  ondansetron (ZOFRAN) tablet 4 mg, 4 mg, Oral, Q6H PRN **OR** ondansetron (ZOFRAN) injection 4 mg, 4 mg, Intravenous, Q6H PRN, Tamala Julian, Rondell A, MD, 4 mg at 12/04/18 2027 .  senna-docusate (Senokot-S) tablet 1 tablet, 1 tablet, Per Tube, Daily, Bowser, Laurel Dimmer, NP, 1 tablet at 12/13/18 1010 .  sucroferric oxyhydroxide (VELPHORO) chewable tablet 2,000 mg, 2,000 mg, Oral, TID WC, Edrick Oh, MD, 2,000 mg at 12/13/18 1325  Patients Current Diet:  Diet Order  Diet Carb Modified Fluid consistency: Thin; Room service appropriate? No  Diet effective now              Precautions / Restrictions Precautions Precautions: Fall Precaution Comments: monitor vitals closely Restrictions Weight Bearing Restrictions: No   Has the patient had 2 or more falls or a fall with injury in the past year? No  Prior Activity Level Community (5-7x/wk): independent; works night shift and drives self to outpt dialysis  Prior Functional Level Self Care: Did the patient need help bathing, dressing, using the toilet or eating? Independent  Indoor Mobility: Did the patient need assistance with walking from room to room (with or without device)? Independent  Stairs: Did the patient need assistance with internal or external stairs (with or without device)? Independent  Functional Cognition: Did the patient need help planning regular tasks such as shopping or remembering to take medications? Independent  Home Assistive Devices / Equipment Home Assistive Devices/Equipment: None  Prior Device Use: Indicate devices/aids used by the patient prior to current illness, exacerbation or injury? None of the above  Current  Functional Level Cognition  Overall Cognitive Status: Within Functional Limits for tasks assessed Current Attention Level: Focused Orientation Level: Oriented X4 Following Commands: Follows multi-step commands with increased time(followed ~10% of commands) Safety/Judgement: Decreased awareness of safety, Decreased awareness of deficits General Comments: Pt with slowed response time but motivated and engaged throughout session.     Extremity Assessment (includes Sensation/Coordination)  Upper Extremity Assessment: RUE deficits/detail RUE Deficits / Details: decr shoulder flexion < 50 degrees, pain with wrist flexion/ extension, edema at digits. pt noted to have mittens in room from previous use  Lower Extremity Assessment: Defer to PT evaluation    ADLs  Overall ADL's : Needs assistance/impaired Eating/Feeding: Minimal assistance, Sitting Eating/Feeding Details (indicate cue type and reason): requires use of L hand to self feed. requires (A) to open containers and maintain grasp on containers Grooming: Oral care, Sitting Grooming Details (indicate cue type and reason): sitting eob unsupported with L lateral lean spilling and decr lip closure  Upper Body Bathing: Maximal assistance Lower Body Bathing: Total assistance Upper Body Dressing : Maximal assistance Lower Body Dressing: Total assistance Lower Body Dressing Details (indicate cue type and reason): pt does attempt to lift leg to help don socks Toilet Transfer Details (indicate cue type and reason): completes bed pan use rolling tot he Right side. pt voiding appropraite iwth bed pan use Toileting- Clothing Manipulation and Hygiene: Total assistance General ADL Comments: pt declined need to perform ADL this session, agreeable to UE HEP/ROM as he has just returned to bed from sitting up in recliner    Mobility  Overal bed mobility: Needs Assistance Bed Mobility: Supine to Sit Rolling: Max assist Supine to sit: Min guard, HOB  elevated Sit to supine: Min guard General bed mobility comments: pt able to reposition himself in bed with supervision    Transfers  Overall transfer level: Needs assistance Equipment used: Rolling walker (2 wheeled) Transfers: Sit to/from Stand Sit to Stand: Min assist Stand pivot transfers: Min assist, +2 safety/equipment General transfer comment: Assist to bring hips up and for balance. Verbal cues for hand placement.     Ambulation / Gait / Stairs / Wheelchair Mobility  Ambulation/Gait Ambulation/Gait assistance: Herbalist (Feet): 125 Feet Assistive device: Rolling walker (2 wheeled) Gait Pattern/deviations: Step-through pattern, Decreased step length - right, Decreased step length - left, Trunk flexed General Gait Details: Assist for balance and support. Verbal cues  to stand more erect and look up Gait velocity: decr Gait velocity interpretation: 1.31 - 2.62 ft/sec, indicative of limited community ambulator    Posture / Balance Balance Overall balance assessment: Needs assistance Sitting-balance support: No upper extremity supported, Feet supported Sitting balance-Leahy Scale: Fair Standing balance support: Bilateral upper extremity supported Standing balance-Leahy Scale: Poor Standing balance comment: walker and min guard for static standing    Special needs/care consideration BiPAP/CPAP  Yes pta CPM  N/a Continuous Drip IV  N/a Dialysis outpt hemodialysis TTHSat [place]; drives self Life Vest  N/a Oxygen  N/a Special Bed  N/a Trach Size  N/a Wound Vac n/a Skin  Date of Service:  12/13/2018 11:06 AM          Incomplete         Show:Clear all _0 Manual_1 Template_2 Copied  Added by: _3 Nadara Mode, RN  _4 Hover for details WOC Nurse wound consult note Reason for Consult: perineum  Wound type: moisture vs pressure Pressure Injury POA: Yes/No/NA Measurement: 11cm x 2cm x 0.1cm  Wound bed:90% yellow/soft grey/10% pink  Drainage  (amount, consistency, odor) minimal with slight odor, yellow Peri wound:intact  Dressing procedure/placement/frequency: Enzymatic debridement to the gluteal cleft wound, apply daily.  ABD pad deep into the cleft to absorb moisture.    [facility] Nurse team will follow with you and see patient within 10 days for wound assessments.  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   Previous Home Environment  Living Arrangements: Alone  Lives With: Alone Available Help at Discharge: Family, Available PRN/intermittently(parents in [place] may come to assist) Type of Home: Mobile home Home Layout: One level Home Access: Stairs to enter Entrance Stairs-Rails: Right, Left, Can reach both Entrance Stairs-Number of Steps: 5 Bathroom Shower/Tub: Tub/shower unit, Architectural technologist: Standard Bathroom Accessibility: Yes How Accessible: Accessible via walker [place]: No Additional Comments: pt clarified PLO and home living situation 0n 7/6 clearly  Discharge Living Setting Plans for Discharge Living Setting: Patient's home, Alone, Mobile Home Type of Home at Discharge: Mobile home Discharge Home Layout: One level Discharge Home Access: Stairs to enter Entrance Stairs-Rails: Right, Left, Can reach both Entrance Stairs-Number of Steps: 5 Discharge Bathroom Shower/Tub: Tub/shower unit, Curtain Discharge Bathroom Toilet: Standard Discharge Bathroom Accessibility: Yes How Accessible: Accessible via walker Does the patient have any problems obtaining your medications?: No  Social/Family/Support Systems 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 Information: parents  in [place] Anticipated Caregiver: parents prn only Anticipated Ambulance person Information: see above Ability/Limitations of Caregiver: none, but live out of town Caregiver Availability: Intermittent Discharge Plan Discussed with Primary Caregiver: No Is Caregiver In Agreement with Plan?: No Does Caregiver/Family have Issues with Lodging/Transportation while Pt is in Rehab?: No  Goals/Additional Needs Patient/Family Goal for Rehab: Mod I with PT and OT Expected length of stay: ELOS 7 to 10 days Special Service Needs: ESRD on hemodialysis TTHSat in [place]; drives self Pt/Family Agrees to Admission and willing to participate: Yes Program Orientation Provided & Reviewed with Pt/Caregiver Including Roles  & Responsibilities: Yes  Decrease burden of Care through IP rehab admission: n/a  Possible need for SNF placement upon discharge: not anticipated  Patient Condition: I have reviewed medical records from [facility] , spoken with  patient. I met with patient at the bedside for inpatient rehabilitation assessment.  Patient will benefit from ongoing PT and OT, can actively participate in 3 hours of therapy a day 5 days of the week, and can make measurable gains during the admission.  Patient will also benefit from the coordinated team approach during an Inpatient Acute Rehabilitation admission.  The patient will receive intensive therapy as well as Rehabilitation physician, nursing, social worker, and care management interventions.  Due to bladder management, bowel management, safety, skin/wound care, disease management, medication administration, pain management and patient education the patient requires 24 hour a day rehabilitation nursing.  The patient is currently min assist with mobility and basic ADLs.  Discharge setting and therapy post discharge at home with home health is anticipated.  Patient has agreed to participate in the Acute Inpatient Rehabilitation Program and will  admit today.  Preadmission Screen Completed By:  Cleatrice Burke, 12/13/2018 4:29 PM ______________________________________________________________________   Discussed status with Dr. Posey Pronto  on 12/13/2018 at  78 and received approval for admission today.  Admission Coordinator:  Cleatrice Burke, RN MSN time  1630 Date  12/13/2018   Assessment/Plan: Diagnosis: debility  1. Does the need for close, 24 hr/day Medical supervision in concert with the patient's rehab needs make it unreasonable for this patient to be served in a less intensive setting? Potentially  2. Co-Morbidities requiring supervision/potential complications: end-stage renal disease with hemodialysis status post kidney transplant in May 2014 followed at [facility], poorly controlled hypertension, diabetes mellitus, diastolic congestive heart failure, morbid obesity with BMI of 40, obstructive sleep apnea on CPAP 3. Due to bowel management, safety, disease management and patient education, does the patient require 24 hr/day rehab nursing? Potentially 4. Does the patient require coordinated care of a physician, rehab nurse, PT (1-2 hrs/day, 5 days/week) and OT (1-2 hrs/day, 5 days/week) to address physical and functional deficits in the context of the above medical diagnosis(es)? Potentially Addressing deficits in the following areas: balance, endurance, locomotion, strength, transferring, bathing, dressing, toileting and psychosocial support 5. Can the patient actively participate in an intensive therapy program of at least 3 hrs of therapy 5 days a week? Yes 6. The potential for patient to make measurable gains while on inpatient rehab is excellent 7. Anticipated functional outcomes upon discharge from inpatients are: modified independent PT, modified independent OT, n/a SLP 8. Estimated rehab length of stay to reach the above functional goals is: 5-7 days. 9. Anticipated D/C setting: Home 10. Anticipated post  D/C treatments: HH therapy and Home excercise program 11. Overall Rehab/Functional Prognosis: excellent  MD Signature: Delice Lesch, MD, ABPMR

## 2018-12-13 NOTE — H&P (Signed)
Physical Medicine and Rehabilitation Admission H&P    Chief complaint.  Weakness: HPI: [name] is a 43 year old right-handed male with history of end-stage renal disease with hemodialysis status post kidney transplant in May 2014 followed at [facility], poorly controlled hypertension, diabetes mellitus, diastolic congestive heart failure, morbid obesity with BMI of 40, obstructive sleep apnea on CPAP.  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  Presented 11/30/2018 with SOB, cough, and chest pain. He did have episode of emesis that was nonbloody.  Noted low-grade fever 100.4, severe HTN with blood pressure 214/122, oxygen saturation 72%, hemoglobin 8.1, creatinine 15.23, lactic acid 1.2, COVID screening negative, urine drug screen negative.  Chest x-ray showed areas of aspects of opacity thought to represent pneumonia in the right base and left upper lobes.  Patient did require intubation and self extubated 12/06/2018.  Oxygen saturations 92% on room air.  Patient initially with episodes of delirium and received Haldol. Hemodialysis ongoing as per renal services.  Hospital course further complicated by sinus tachycardia, which resolved with addition of beta blocker. Subcutaneous heparin for DVT prophylaxis.  Patient with gluteal cleft wound with Santyl dressing applied changed daily.  Tolerating a regular diet.  Therapy evaluation completed and patient was admitted for a comprehensive rehab program.  Review of Systems  Constitutional: Negative for chills.  HENT: Negative for hearing loss and tinnitus.   Eyes: Negative for blurred vision.  Respiratory: Positive for shortness of breath.   Cardiovascular: Positive for leg swelling.  Gastrointestinal: Positive for constipation and vomiting. Negative for abdominal pain, heartburn and nausea.       GERD  Genitourinary: Negative for dysuria, flank  pain and hematuria.  Musculoskeletal: Positive for myalgias.  Skin: Negative for rash.  Psychiatric/Behavioral: The patient has insomnia.   All other systems reviewed and are negative.  Past Medical History:  Diagnosis Date   Anemia    ESRD   CHF (congestive heart failure) ([place])    Diabetes mellitus without complication ([place])    ESRD on hemodialysis ([place]) 06/07/2011   TTS [facility]. Started HD in 2006, got transplant in 2014 lasted until Dec 2019 then went back on HD.  Has L thigh AVG as of Jun 2020.  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    GERD (gastroesophageal reflux disease)    Hypertension 111/29/2012   pt states he takes no HTN meds   Hypothyroidism, secondary    Morbid obesity ([facility])    Paroxysmal atrial fibrillation (HCC)    Sleep apnea 06/07/2011   Pt had sllep study done 2 weeks ago- Dr. Joellyn Quails.  Does not  have a CPAP at this time.   Past Surgical History:  Procedure Laterality Date   ARTERIOVENOUS GRAFT PLACEMENT  08/09/10   Left Thigh Graft by Dr. [name]   AV FISTULA PLACEMENT Right 05/18/2018   Procedure: INSERTION OF ARTERIOVENOUS (AV) GORE-TEX GRAFT Left THIGH;  Surgeon: Waynetta Sandy, MD;  Location: [facility];  Service: Vascular;  Laterality: Right;   CAPD REMOVAL  05/08/2011   Procedure: CONTINUOUS AMBULATORY PERITONEAL DIALYSIS  (CAPD) CATHETER REMOVAL;  Surgeon: [name], MD;  Location: [facility];  Service: General;  Laterality: N/A;  Removal of CAPD catheter, Dr. requests to go after 100   [place]  10/05/10   Right Femoral Cath insertion by Dr. [name].  Pt ahas had several  caths inserted.   IR FLUORO GUIDE CV LINE RIGHT  05/11/2018   IR US GUIDE VASC ACCESS RIGHT  05/11/2018   KIDNEY TRANSPLANT  2014   UPPER EXTREMITY ANGIOGRAPHY Bilateral 05/13/2018   Procedure: UPPER EXTREMITY ANGIOGRAPHY - bilarteral;  Surgeon: Marty Heck, MD;  Location: [facility];  Service:  Cardiovascular;  Laterality: Bilateral;   Family History  Problem Relation Age of Onset   Diabetes Father    Stroke Father    Anesthesia problems Neg Hx    Social History:  reports that he has never smoked. He has never used smokeless tobacco. He reports current drug use. Frequency: 5.00 times per week. Drug: Marijuana. He reports that he does not drink alcohol. Allergies: No Known Allergies Medications Prior to Admission  Medication Sig Dispense Refill   acetaminophen (TYLENOL) 500 MG tablet Take 1,000 mg by mouth every 6 (six) hours as needed for mild pain or headache.     VELPHORO 500 MG chewable tablet Chew 1,000 mg by mouth 3 (three) times daily with meals.     atorvastatin (LIPITOR) 20 MG tablet Take 20 mg by mouth daily.     imiquimod (ALDARA) 5 % cream Apply thin layer to affected areas three times per week prior to bed. (Patient not taking: Reported on 11/30/2018) 12 each 1    Drug Regimen Review Drug regimen was reviewed and remains appropriate with no significant issues identified  Home: Home Living Family/patient expects to be discharged to:: Inpatient rehab Living Arrangements: Alone Additional Comments: Pt able to nod to some questions, but not clear if answers are accurate.  Not talking currently.    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   Functional Status:  Mobility: Bed Mobility Overal bed mobility: Needs Assistance Bed Mobility: Sit to Supine Rolling: Max assist Sit to supine: Min guard General bed mobility comments: Assist for lines Transfers Overall transfer level: Needs assistance Equipment used: Rolling walker (2 wheeled) Transfers: Sit to/from Stand, [employer] Transfers Sit to Stand: +2 physical assistance, Min assist Stand pivot transfers: Min assist, +2 safety/equipment General transfer comment: Assist to bring hips up and for  balance. Verbal cues for hand placement. Pt with rapid descent to seat surface when returning to sitting. Ambulation/Gait Ambulation/Gait assistance: Min assist, +2 safety/equipment Gait Distance (Feet): 15 Feet(5' x 1, 15' x 1) Assistive device: Rolling walker (2 wheeled) Gait Pattern/deviations: Step-through pattern, Decreased step length - right, Decreased step length - left General Gait Details: Assist for balance and support. Legs weak but no overt buckling. Close chair follow. Gait velocity: decr Gait velocity interpretation: <1.31 ft/sec, indicative of household ambulator    ADL: ADL Overall ADL's : Needs assistance/impaired Eating/Feeding: Minimal assistance, Sitting Eating/Feeding Details (indicate cue type and reason): requires use of L hand to self feed. requires (A) to open containers and maintain grasp on containers Grooming: Oral care, Sitting Grooming Details (indicate cue type and reason): sitting eob unsupported with L lateral lean spilling and decr lip closure  Upper Body Bathing: Maximal assistance Lower Body Bathing: Total assistance Upper Body Dressing : Maximal assistance Lower Body Dressing: Total assistance Lower Body Dressing Details (indicate cue type and reason): pt does attempt to lift leg to help don socks Toilet Transfer Details (indicate cue type and reason): completes bed pan use rolling tot he Right side. pt voiding appropraite iwth bed pan use Toileting- Clothing Manipulation and  Hygiene: Total assistance General ADL Comments: pt remained EOB for >10 minutes min guard (A). pt with decr rolling toward the L side iwth bed mobility,   Cognition: Cognition Overall Cognitive Status: Impaired/Different from baseline Orientation Level: Oriented X4 Cognition Arousal/Alertness: Awake/alert Behavior During Therapy: WFL for tasks assessed/performed Overall Cognitive Status: Impaired/Different from baseline Area of Impairment: Problem solving Orientation  Level: Disoriented to, Place, Time, Situation Current Attention Level: Focused Following Commands: Follows multi-step commands with increased time(followed ~10% of commands) Safety/Judgement: Decreased awareness of safety, Decreased awareness of deficits Awareness: Emergent Problem Solving: Slow processing, Decreased initiation, Requires verbal cues General Comments: Pt with slowed response time but motivated and engaged throughout session.   Physical Exam: Blood pressure (!) 165/95, pulse 90, temperature 97.8 F (36.6 C), resp. rate 16, height 6\' 2"  (1.88 m), weight 135.5 kg, SpO2 100 %. Physical Exam  Vitals reviewed. Constitutional: He appears well-developed.  Morbid obesity  HENT:  Head: Normocephalic and atraumatic.  Eyes: EOM are normal. Right eye exhibits no discharge. Left eye exhibits no discharge.  Respiratory: Effort normal. No respiratory distress.  GI: He exhibits no distension.  Musculoskeletal:     Comments: LE edema  Neurological: He is alert.  Patient is alert in no acute distress. Follow commands fair medical historian Motor: Grossly 4+/5 throughout  Skin: Skin is warm and dry.  Psychiatric: He has a normal mood and affect. His behavior is normal.   Results for orders placed or performed during the hospital encounter of 11/30/18 (from the past 48 hour(s))  Glucose, capillary     Status: None   Collection Time: 12/11/18 12:30 PM  Result Value Ref Range   Glucose-Capillary 80 70 - 99 mg/dL  Glucose, capillary     Status: Abnormal   Collection Time: 12/11/18  2:43 PM  Result Value Ref Range   Glucose-Capillary 109 (H) 70 - 99 mg/dL  Renal function panel     Status: Abnormal   Collection Time: 12/11/18  4:05 PM  Result Value Ref Range   Sodium 135 135 - 145 mmol/L   Potassium 4.6 3.5 - 5.1 mmol/L   Chloride 95 (L) 98 - 111 mmol/L   CO2 23 22 - 32 mmol/L   Glucose, Bld 118 (H) 70 - 99 mg/dL   BUN 103 (H) 6 - 20 mg/dL   Creatinine, Ser 12.09 (H) 0.61 - 1.24  mg/dL   Calcium 9.0 8.9 - 10.3 mg/dL   Phosphorus 8.4 (H) 2.5 - 4.6 mg/dL   Albumin 2.4 (L) 3.5 - 5.0 g/dL   GFR calc non Af Amer 4 (L) >60 mL/min   GFR calc Af Amer 5 (L) >60 mL/min   Anion gap 17 (H) 5 - 15    Comment: Performed at [facility], [address], [place], [name] [address]  Glucose, capillary     Status: None   Collection Time: 12/11/18  6:52 PM  Result Value Ref Range   Glucose-Capillary 89 70 - 99 mg/dL  Glucose, capillary     Status: Abnormal   Collection Time: 12/11/18 11:47 PM  Result Value Ref Range   Glucose-Capillary 109 (H) 70 - 99 mg/dL  Glucose, capillary     Status: Abnormal   Collection Time: 12/12/18  2:33 AM  Result Value Ref Range   Glucose-Capillary 109 (H) 70 - 99 mg/dL  Glucose, capillary     Status: None   Collection Time: 12/12/18  7:35 AM  Result Value Ref Range   Glucose-Capillary 97 70 -  99 mg/dL  Comprehensive metabolic panel     Status: Abnormal   Collection Time: 12/12/18 10:33 AM  Result Value Ref Range   Sodium 133 (L) 135 - 145 mmol/L   Potassium 4.3 3.5 - 5.1 mmol/L   Chloride 95 (L) 98 - 111 mmol/L   CO2 25 22 - 32 mmol/L   Glucose, Bld 104 (H) 70 - 99 mg/dL   BUN 79 (H) 6 - 20 mg/dL   Creatinine, Ser 10.55 (H) 0.61 - 1.24 mg/dL   Calcium 9.0 8.9 - 10.3 mg/dL   Total Protein 6.9 6.5 - 8.1 g/dL   Albumin 2.6 (L) 3.5 - 5.0 g/dL   AST 25 15 - 41 U/L   ALT 151 (H) 0 - 44 U/L   Alkaline Phosphatase 81 38 - 126 U/L   Total Bilirubin 1.1 0.3 - 1.2 mg/dL   GFR calc non Af Amer 5 (L) >60 mL/min   GFR calc Af Amer 6 (L) >60 mL/min   Anion gap 13 5 - 15    Comment: Performed at [facility], [place] [address], [place], [place] [address]  Glucose, capillary     Status: Abnormal   Collection Time: 12/12/18 11:26 AM  Result Value Ref Range   Glucose-Capillary 112 (H) 70 - 99 mg/dL  Glucose, capillary     Status: None   Collection Time: 12/12/18  4:28 PM  Result Value Ref Range   Glucose-Capillary 97 70 - 99 mg/dL    Glucose, capillary     Status: Abnormal   Collection Time: 12/12/18  7:45 PM  Result Value Ref Range   Glucose-Capillary 115 (H) 70 - 99 mg/dL  Glucose, capillary     Status: None   Collection Time: 12/12/18 11:31 PM  Result Value Ref Range   Glucose-Capillary 89 70 - 99 mg/dL  Glucose, capillary     Status: None   Collection Time: 12/13/18  3:23 AM  Result Value Ref Range   Glucose-Capillary 95 70 - 99 mg/dL  CBC with Differential/Platelet     Status: Abnormal   Collection Time: 12/13/18  5:44 AM  Result Value Ref Range   WBC 10.0 4.0 - 10.5 K/uL   RBC 3.06 (L) 4.22 - 5.81 MIL/uL   Hemoglobin 8.2 (L) 13.0 - 17.0 g/dL   HCT 26.9 (L) 39.0 - 52.0 %   MCV 87.9 80.0 - 100.0 fL   MCH 26.8 26.0 - 34.0 pg   MCHC 30.5 30.0 - 36.0 g/dL   RDW 19.2 (H) 11.5 - 15.5 %   Platelets 327 150 - 400 K/uL   nRBC 0.0 0.0 - 0.2 %   Neutrophils Relative % 63 %   Neutro Abs 6.4 1.7 - 7.7 K/uL   Lymphocytes Relative 13 %   Lymphs Abs 1.3 0.7 - 4.0 K/uL   Monocytes Relative 17 %   Monocytes Absolute 1.7 (H) 0.1 - 1.0 K/uL   Eosinophils Relative 5 %   Eosinophils Absolute 0.5 0.0 - 0.5 K/uL   Basophils Relative 1 %   Basophils Absolute 0.1 0.0 - 0.1 K/uL   Immature Granulocytes 1 %   Abs Immature Granulocytes 0.10 (H) 0.00 - 0.07 K/uL    Comment: Performed at [facility], [address] [place], [place] [address]  Magnesium     Status: Abnormal   Collection Time: 12/13/18  5:44 AM  Result Value Ref Range   Magnesium 2.8 (H) 1.7 - 2.4 mg/dL    Comment: Performed at  [facility], [place] [address] [place], [place] [address]  Glucose, capillary     Status: None   Collection Time: 12/13/18  7:31 AM  Result Value Ref Range   Glucose-Capillary 71 70 - 99 mg/dL   No results found.     Medical Problem List and Plan: 1.  Debility secondary to ARDS/pulmonary edema with acute metabolic encephalopathy  Admit to CIR 2.  Antithrombotics: -DVT/anticoagulation: Subcutaneous heparin.   Monitor for any bleeding episodes  -antiplatelet therapy: N/A 3. Pain Management: Tylenol as needed 4. Mood: Provide emotional support  -antipsychotic agents: N/A 5. Neuropsych: This patient is capable of making decisions on his own behalf. 6. Skin/Wound Care: Santyl to gluteal cleft wound daily.  Routine skin checks 7. Fluids/Electrolytes/Nutrition: Routine in and outs. Follow up CMP tomorrow.   8.  Uncontrolled hypertension as well as tachycardia.  Patient with history of medical noncompliance.  Lopressor 25 mg twice daily, Norvasc 5 mg daily.  Follow cardiology services as needed. Monitor with increased mobility. 9.  End-stage renal disease.  Hemodialysis as directed 10.  Diabetes mellitus poor medical compliance.  SSI.  Check blood sugars before meals and at bedtime.  Diabetic teaching. Monitor with increased mobility.  11.  Morbid obesity.  BMI 38.35.  Dietary follow-up.. Encourage weight loss. 12.  OSA.  CPAP as directed.  Post Admission Physician Evaluation: 1. Preadmission assessment reviewed and changes made below. 2. Functional deficits secondary  to debility. 3. Patient is admitted to receive collaborative, interdisciplinary care between the physiatrist, rehab nursing staff, and therapy team. 4. Patient's level of medical complexity and substantial therapy needs in context of that medical necessity cannot be provided at a lesser intensity of care such as a SNF. 5. Patient has experienced substantial functional loss from his/her baseline which was documented above under the "Functional History" and "Functional Status" headings.  Judging by the patient's diagnosis, physical exam, and functional history, the patient has potential for functional progress which will result in measurable gains while on inpatient rehab.  These gains will be of substantial and practical use upon discharge  in facilitating mobility and self-care at the household level. 6. Physiatrist will provide 24 hour  management of medical needs as well as oversight of the therapy plan/treatment and provide guidance as appropriate regarding the interaction of the two. 7. 24 hour rehab nursing will assist with safety, skin/wound care, disease management and patient education  and help integrate therapy concepts, techniques,education, etc. 8. PT will assess and treat for/with: Lower extremity strength, range of motion, stamina, balance, functional mobility, safety, adaptive techniques and equipment, wound care, coping skills, pain control, education. Goals are: Mod I. 9. OT will assess and treat for/with: ADL's, functional mobility, safety, upper extremity strength, adaptive techniques and equipment, wound mgt, ego support, and community reintegration.   Goals are: Mod I. Therapy may proceed with showering this patient. 10. Case Management and Social Worker will assess and treat for psychological issues and discharge planning. 11. Team conference will be held weekly to assess progress toward goals and to determine barriers to discharge. 12. Patient will receive at least 3 hours of therapy per day at least 5 days per week. 13. ELOS: 4-7 days.       14. Prognosis:  excellent  I have personally performed a face to face diagnostic evaluation, including, but not limited to relevant history and physical exam findings, of this patient and developed relevant assessment and plan.  Additionally, I have reviewed and concur with the  physician assistant's documentation above.  Delice Lesch, MD, ABPMR [facility]on Paganini Angiulli, PA-C 12/13/2018

## 2018-12-13 NOTE — H&P (Signed)
Physical Medicine and Rehabilitation Admission H&P    Chief complaint.  Weakness: HPI: [name] is a 43 year old right-handed male with history of end-stage renal disease with hemodialysis status post kidney transplant in May 2014 followed at [facility], poorly controlled hypertension, diabetes mellitus, diastolic congestive heart failure, morbid obesity with BMI of 40, obstructive sleep apnea on CPAP.  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  Presented 11/30/2018 with SOB, cough, and chest pain. He did have episode of emesis that was nonbloody.  Noted low-grade fever 100.4, severe HTN with blood pressure 214/122, oxygen saturation 72%, hemoglobin 8.1, creatinine 15.23, lactic acid 1.2, COVID screening negative, urine drug screen negative.  Chest x-ray showed areas of aspects of opacity thought to represent pneumonia in the right base and left upper lobes.  Patient did require intubation and self extubated 12/06/2018.  Oxygen saturations 92% on room air.  Patient initially with episodes of delirium and received Haldol. Hemodialysis ongoing as per renal services.  Hospital course further complicated by sinus tachycardia, which resolved with addition of beta blocker. Subcutaneous heparin for DVT prophylaxis.  Patient with gluteal cleft wound with Santyl dressing applied changed daily.  Tolerating a regular diet.  Therapy evaluation completed and patient was admitted for a comprehensive rehab program.  Review of Systems  Constitutional: Negative for chills.  HENT: Negative for hearing loss and tinnitus.   Eyes: Negative for blurred vision.  Respiratory: Positive for shortness of breath.   Cardiovascular: Positive for leg swelling.  Gastrointestinal: Positive for constipation and vomiting. Negative for abdominal pain, heartburn and nausea.       GERD  Genitourinary: Negative for dysuria, flank  pain and hematuria.  Musculoskeletal: Positive for myalgias.  Skin: Negative for rash.  Psychiatric/Behavioral: The patient has insomnia.   All other systems reviewed and are negative.  Past Medical History:  Diagnosis Date  . Anemia    ESRD  . CHF (congestive heart failure) ([place])   . Diabetes mellitus without complication ([place])   . ESRD on hemodialysis ([place]) 06/07/2011   TTS [employer]. Started HD in 2006, got transplant in 2014 lasted until Dec 2019 then went back on HD.  Has L thigh AVG as of Jun 2020.  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   Marland Kitchen GERD (gastroesophageal reflux disease)   . Hypertension 111/29/2012   pt states he takes no HTN meds  . Hypothyroidism, secondary   . Morbid obesity ([place])   . Paroxysmal atrial fibrillation ([facility])   . Sleep apnea 06/07/2011   Pt had sllep study done 2 weeks ago- Dr. Joellyn Quails.  Does not  have a CPAP at this time.   Past Surgical History:  Procedure Laterality Date  . ARTERIOVENOUS GRAFT PLACEMENT  08/09/10   Left Thigh Graft by Dr. [name]  . AV FISTULA PLACEMENT Right 05/18/2018   Procedure: INSERTION OF ARTERIOVENOUS (AV) GORE-TEX GRAFT Left THIGH;  Surgeon: Waynetta Sandy, MD;  Location: [facility];  Service: Vascular;  Laterality: Right;  . CAPD REMOVAL  05/08/2011   Procedure: CONTINUOUS AMBULATORY PERITONEAL DIALYSIS  (CAPD) CATHETER REMOVAL;  Surgeon: [name], MD;  Location: [facility];  Service: General;  Laterality: N/A;  Removal of CAPD catheter, Dr. requests to go after 100  . INSERTION OF DIALYSIS CATHETER  10/05/10   Right Femoral Cath insertion by Dr. [name].  Pt ahas had several  caths inserted.  . IR FLUORO GUIDE CV LINE RIGHT  05/11/2018  . IR US GUIDE VASC ACCESS RIGHT  05/11/2018  . KIDNEY TRANSPLANT  2014  . UPPER EXTREMITY ANGIOGRAPHY Bilateral 05/13/2018   Procedure: UPPER EXTREMITY ANGIOGRAPHY - bilarteral;  Surgeon: Marty Heck, MD;  Location: [facility] [place] [facility];  Service:  Cardiovascular;  Laterality: Bilateral;   Family History  Problem Relation Age of Onset  . Diabetes Father   . Stroke Father   . Anesthesia problems Neg Hx    Social History:  reports that he has never smoked. He has never used smokeless tobacco. He reports current drug use. Frequency: 5.00 times per week. Drug: Marijuana. He reports that he does not drink alcohol. Allergies: No Known Allergies Medications Prior to Admission  Medication Sig Dispense Refill  . acetaminophen (TYLENOL) 500 MG tablet Take 1,000 mg by mouth every 6 (six) hours as needed for mild pain or headache.    . VELPHORO 500 MG chewable tablet Chew 1,000 mg by mouth 3 (three) times daily with meals.    Marland Kitchen atorvastatin (LIPITOR) 20 MG tablet Take 20 mg by mouth daily.    . imiquimod (ALDARA) 5 % cream Apply thin layer to affected areas three times per week prior to bed. (Patient not taking: Reported on 11/30/2018) 12 each 1    Drug Regimen Review Drug regimen was reviewed and remains appropriate with no significant issues identified  Home: Home Living Family/patient expects to be discharged to:: Inpatient rehab Living Arrangements: Alone Additional Comments: Pt able to nod to some questions, but not clear if answers are accurate.  Not talking currently.    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   Functional Status:  Mobility: Bed Mobility Overal bed mobility: Needs Assistance Bed Mobility: Sit to Supine Rolling: Max assist Sit to supine: Min guard General bed mobility comments: Assist for lines Transfers Overall transfer level: Needs assistance Equipment used: Rolling walker (2 wheeled) Transfers: Sit to/from Stand, [employer] Transfers Sit to Stand: +2 physical assistance, Min assist Stand pivot transfers: Min assist, +2 safety/equipment General transfer comment: Assist to bring hips up and for  balance. Verbal cues for hand placement. Pt with rapid descent to seat surface when returning to sitting. Ambulation/Gait Ambulation/Gait assistance: Min assist, +2 safety/equipment Gait Distance (Feet): 15 Feet(5' x 1, 15' x 1) Assistive device: Rolling walker (2 wheeled) Gait Pattern/deviations: Step-through pattern, Decreased step length - right, Decreased step length - left General Gait Details: Assist for balance and support. Legs weak but no overt buckling. Close chair follow. Gait velocity: decr Gait velocity interpretation: <1.31 ft/sec, indicative of household ambulator    ADL: ADL Overall ADL's : Needs assistance/impaired Eating/Feeding: Minimal assistance, Sitting Eating/Feeding Details (indicate cue type and reason): requires use of L hand to self feed. requires (A) to open containers and maintain grasp on containers Grooming: Oral care, Sitting Grooming Details (indicate cue type and reason): sitting eob unsupported with L lateral lean spilling and decr lip closure  Upper Body Bathing: Maximal assistance Lower Body Bathing: Total assistance Upper Body Dressing : Maximal assistance Lower Body Dressing: Total assistance Lower Body Dressing Details (indicate cue type and reason): pt does attempt to lift leg to help don socks Toilet Transfer Details (indicate cue type and reason): completes bed pan use rolling tot he Right side. pt voiding appropraite iwth bed pan use Toileting- Clothing Manipulation and  Hygiene: Total assistance General ADL Comments: pt remained EOB for >10 minutes min guard (A). pt with decr rolling toward the L side iwth bed mobility,   Cognition: Cognition Overall Cognitive Status: Impaired/Different from baseline Orientation Level: Oriented X4 Cognition Arousal/Alertness: Awake/alert Behavior During Therapy: WFL for tasks assessed/performed Overall Cognitive Status: Impaired/Different from baseline Area of Impairment: Problem solving Orientation  Level: Disoriented to, Place, Time, Situation Current Attention Level: Focused Following Commands: Follows multi-step commands with increased time(followed ~10% of commands) Safety/Judgement: Decreased awareness of safety, Decreased awareness of deficits Awareness: Emergent Problem Solving: Slow processing, Decreased initiation, Requires verbal cues General Comments: Pt with slowed response time but motivated and engaged throughout session.   Physical Exam: Blood pressure (!) 165/95, pulse 90, temperature 97.8 F (36.6 C), resp. rate 16, height 6\' 2"  (1.88 m), weight 135.5 kg, SpO2 100 %. Physical Exam  Vitals reviewed. Constitutional: He appears well-developed.  Morbid obesity  HENT:  Head: Normocephalic and atraumatic.  Eyes: EOM are normal. Right eye exhibits no discharge. Left eye exhibits no discharge.  Respiratory: Effort normal. No respiratory distress.  GI: He exhibits no distension.  Musculoskeletal:     Comments: LE edema  Neurological: He is alert.  Patient is alert in no acute distress. Follow commands fair medical historian Motor: Grossly 4+/5 throughout  Skin: Skin is warm and dry.  Psychiatric: He has a normal mood and affect. His behavior is normal.   Results for orders placed or performed during the hospital encounter of 11/30/18 (from the past 48 hour(s))  Glucose, capillary     Status: None   Collection Time: 12/11/18 12:30 PM  Result Value Ref Range   Glucose-Capillary 80 70 - 99 mg/dL  Glucose, capillary     Status: Abnormal   Collection Time: 12/11/18  2:43 PM  Result Value Ref Range   Glucose-Capillary 109 (H) 70 - 99 mg/dL  Renal function panel     Status: Abnormal   Collection Time: 12/11/18  4:05 PM  Result Value Ref Range   Sodium 135 135 - 145 mmol/L   Potassium 4.6 3.5 - 5.1 mmol/L   Chloride 95 (L) 98 - 111 mmol/L   CO2 23 22 - 32 mmol/L   Glucose, Bld 118 (H) 70 - 99 mg/dL   BUN 103 (H) 6 - 20 mg/dL   Creatinine, Ser 12.09 (H) 0.61 - 1.24  mg/dL   Calcium 9.0 8.9 - 10.3 mg/dL   Phosphorus 8.4 (H) 2.5 - 4.6 mg/dL   Albumin 2.4 (L) 3.5 - 5.0 g/dL   GFR calc non Af Amer 4 (L) >60 mL/min   GFR calc Af Amer 5 (L) >60 mL/min   Anion gap 17 (H) 5 - 15    Comment: Performed at [facility], [address], [place], [name] [address]  Glucose, capillary     Status: None   Collection Time: 12/11/18  6:52 PM  Result Value Ref Range   Glucose-Capillary 89 70 - 99 mg/dL  Glucose, capillary     Status: Abnormal   Collection Time: 12/11/18 11:47 PM  Result Value Ref Range   Glucose-Capillary 109 (H) 70 - 99 mg/dL  Glucose, capillary     Status: Abnormal   Collection Time: 12/12/18  2:33 AM  Result Value Ref Range   Glucose-Capillary 109 (H) 70 - 99 mg/dL  Glucose, capillary     Status: None   Collection Time: 12/12/18  7:35 AM  Result Value Ref Range   Glucose-Capillary 97 70 -  99 mg/dL  Comprehensive metabolic panel     Status: Abnormal   Collection Time: 12/12/18 10:33 AM  Result Value Ref Range   Sodium 133 (L) 135 - 145 mmol/L   Potassium 4.3 3.5 - 5.1 mmol/L   Chloride 95 (L) 98 - 111 mmol/L   CO2 25 22 - 32 mmol/L   Glucose, Bld 104 (H) 70 - 99 mg/dL   BUN 79 (H) 6 - 20 mg/dL   Creatinine, Ser 10.55 (H) 0.61 - 1.24 mg/dL   Calcium 9.0 8.9 - 10.3 mg/dL   Total Protein 6.9 6.5 - 8.1 g/dL   Albumin 2.6 (L) 3.5 - 5.0 g/dL   AST 25 15 - 41 U/L   ALT 151 (H) 0 - 44 U/L   Alkaline Phosphatase 81 38 - 126 U/L   Total Bilirubin 1.1 0.3 - 1.2 mg/dL   GFR calc non Af Amer 5 (L) >60 mL/min   GFR calc Af Amer 6 (L) >60 mL/min   Anion gap 13 5 - 15    Comment: Performed at [facility], [place] [address], [place], [place] [address]  Glucose, capillary     Status: Abnormal   Collection Time: 12/12/18 11:26 AM  Result Value Ref Range   Glucose-Capillary 112 (H) 70 - 99 mg/dL  Glucose, capillary     Status: None   Collection Time: 12/12/18  4:28 PM  Result Value Ref Range   Glucose-Capillary 97 70 - 99 mg/dL   Glucose, capillary     Status: Abnormal   Collection Time: 12/12/18  7:45 PM  Result Value Ref Range   Glucose-Capillary 115 (H) 70 - 99 mg/dL  Glucose, capillary     Status: None   Collection Time: 12/12/18 11:31 PM  Result Value Ref Range   Glucose-Capillary 89 70 - 99 mg/dL  Glucose, capillary     Status: None   Collection Time: 12/13/18  3:23 AM  Result Value Ref Range   Glucose-Capillary 95 70 - 99 mg/dL  CBC with Differential/Platelet     Status: Abnormal   Collection Time: 12/13/18  5:44 AM  Result Value Ref Range   WBC 10.0 4.0 - 10.5 K/uL   RBC 3.06 (L) 4.22 - 5.81 MIL/uL   Hemoglobin 8.2 (L) 13.0 - 17.0 g/dL   HCT 26.9 (L) 39.0 - 52.0 %   MCV 87.9 80.0 - 100.0 fL   MCH 26.8 26.0 - 34.0 pg   MCHC 30.5 30.0 - 36.0 g/dL   RDW 19.2 (H) 11.5 - 15.5 %   Platelets 327 150 - 400 K/uL   nRBC 0.0 0.0 - 0.2 %   Neutrophils Relative % 63 %   Neutro Abs 6.4 1.7 - 7.7 K/uL   Lymphocytes Relative 13 %   Lymphs Abs 1.3 0.7 - 4.0 K/uL   Monocytes Relative 17 %   Monocytes Absolute 1.7 (H) 0.1 - 1.0 K/uL   Eosinophils Relative 5 %   Eosinophils Absolute 0.5 0.0 - 0.5 K/uL   Basophils Relative 1 %   Basophils Absolute 0.1 0.0 - 0.1 K/uL   Immature Granulocytes 1 %   Abs Immature Granulocytes 0.10 (H) 0.00 - 0.07 K/uL    Comment: Performed at [facility], [address] [place], [place] [address]  Magnesium     Status: Abnormal   Collection Time: 12/13/18  5:44 AM  Result Value Ref Range   Magnesium 2.8 (H) 1.7 - 2.4 mg/dL    Comment: Performed at [facility]  [facility], [place] [address], [place], [place] [address]  Glucose, capillary     Status: None   Collection Time: 12/13/18  7:31 AM  Result Value Ref Range   Glucose-Capillary 71 70 - 99 mg/dL   No results found.     Medical Problem List and Plan: 1.  Debility secondary to ARDS/pulmonary edema with acute metabolic encephalopathy  Admit to CIR 2.  Antithrombotics: -DVT/anticoagulation: Subcutaneous heparin.   Monitor for any bleeding episodes  -antiplatelet therapy: N/A 3. Pain Management: Tylenol as needed 4. Mood: Provide emotional support  -antipsychotic agents: N/A 5. Neuropsych: This patient is capable of making decisions on his own behalf. 6. Skin/Wound Care: Santyl to gluteal cleft wound daily.  Routine skin checks 7. Fluids/Electrolytes/Nutrition: Routine in and outs. Follow up CMP tomorrow.   8.  Uncontrolled hypertension as well as tachycardia.  Patient with history of medical noncompliance.  Lopressor 25 mg twice daily, Norvasc 5 mg daily.  Follow cardiology services as needed. Monitor with increased mobility. 9.  End-stage renal disease.  Hemodialysis as directed 10.  Diabetes mellitus poor medical compliance.  SSI.  Check blood sugars before meals and at bedtime.  Diabetic teaching. Monitor with increased mobility.  11.  Morbid obesity.  BMI 38.35.  Dietary follow-up.. Encourage weight loss. 12.  OSA.  CPAP as directed.  Lavon Paganini Angiulli, PA-C 12/13/2018

## 2018-12-13 NOTE — Progress Notes (Signed)
Pt placed on CPAP 10 and a full face mask. Pt tol well at this time. Will monitor.

## 2018-12-13 NOTE — Progress Notes (Signed)
Occupational Therapy Treatment Patient Details Name: [name] MRN: [id number] DOB: [birth year] Today's Date: 12/13/2018    History of present illness 43 y.o. male admitted on 11/30/18 for SOB, COVID testing (-).  Pt dx with sepsis due to CAP, acute respiratory failure with hypoxia initally on BiPap, but ultimately intubated 11/30/18 and self extubated 12/06/18.  Pt normally on HD T, TH, Sat, but had to be on CRRT 6/24-6/26/20. Pt dx with ARDS, acute encepholopathy, septic shock due to aspiration,  RV dysfunction (related to ARDS), transaminitis, anemia of chronic disease, and elevated troponin (thought to be demand ischemia).  Pt with other significant PMH of DM, failed kidney transplant at [facility], PAF, morbid obesity, HTN, DM, CHF, sleep apnea.     OT comments  Pt presents supine in bed, reports having just returned to bed from being up in recliner. Pt continues to have limitations due to UE weakness and pain, with pain most notable today in R shoulder. Pt engaged in A/AAROM and gentle stretching to bil UE for continued strengthening and edema management. VSS throughout session. Pt hopeful for discharge to CIR soon. Feel dispo recommendation remains appropriate at this time. Will continue to follow acutely.   Follow Up Recommendations  CIR    Equipment Recommendations  3 in 1 bedside commode(TBD in next venue; will require bariatric)          Precautions / Restrictions Precautions Precautions: Fall Restrictions Weight Bearing Restrictions: No       Mobility Bed Mobility Overal bed mobility: Needs Assistance Bed Mobility: Supine to Sit     Supine to sit: Min guard;HOB elevated     General bed mobility comments: pt able to reposition himself in bed with supervision  Transfers Overall transfer level: Needs assistance Equipment used: Rolling walker (2 wheeled) Transfers: Sit to/from Stand Sit to Stand: Min assist         General transfer comment: Assist to bring  hips up and for balance. Verbal cues for hand placement.     Balance Overall balance assessment: Needs assistance Sitting-balance support: No upper extremity supported;Feet supported Sitting balance-Leahy Scale: Fair     Standing balance support: Bilateral upper extremity supported Standing balance-Leahy Scale: Poor Standing balance comment: walker and min guard for static standing                           ADL either performed or assessed with clinical judgement   ADL Overall ADL's : Needs assistance/impaired                                       General ADL Comments: pt declined need to perform ADL this session, agreeable to UE HEP/ROM as he has just returned to bed from sitting up in recliner                       Cognition Arousal/Alertness: Awake/alert Behavior During Therapy: WFL for tasks assessed/performed Overall Cognitive Status: Within Functional Limits for tasks assessed                                          Exercises Exercises: General Upper Extremity;Other exercises General Exercises - Upper Extremity Shoulder Flexion: AAROM;Left;10 reps;Supine Elbow Flexion: AROM;Both;10 reps Elbow Extension: AROM;Both;10  reps Wrist Flexion: AROM;Both;10 reps Wrist Extension: AROM;Both;10 reps Digit Composite Flexion: AROM;Both;10 reps Composite Extension: AROM;Both;10 reps Other Exercises Other Exercises: shoulder rolls/shrugs x10 Other Exercises: scapular retraction and hold x3 sec, x10 reps, bil UE Other Exercises: modified lap slide RUE AAROM, x10 (limited due to pain)  Other Exercises: PNF diagonals LUE AAROM x10    Shoulder Instructions       General Comments VSS    Pertinent Vitals/ Pain       Pain Assessment: Faces Faces Pain Scale: Hurts even more Pain Location: R shoulder with certain ROM  Home Living     Available Help at Discharge: Family;Available PRN/intermittently(parents in [place] may  come to assist) Type of Home: Mobile home Home Access: Stairs to enter Entrance Stairs-Number of Steps: 5 Entrance Stairs-Rails: Right;Left;Can reach both Home Layout: One level     Bathroom Shower/Tub: Corporate investment banker: Standard Bathroom Accessibility: Yes How Accessible: Accessible via walker     Additional Comments: pt clarified PLO and home living situation 0n 7/6 clearly  Lives With: Alone    Prior Functioning/Environment              Frequency  Min 3X/week        Progress Toward Goals  OT Goals(current goals can now be found in the care plan section)  Progress towards OT goals: Progressing toward goals  Acute Rehab OT Goals Patient Stated Goal: get stronger OT Goal Formulation: With patient Time For Goal Achievement: 12/22/18 Potential to Achieve Goals: Good  Plan Discharge plan remains appropriate    Co-evaluation                 AM-PAC OT "6 Clicks" Daily Activity     Outcome Measure   Help from another person eating meals?: A Little Help from another person taking care of personal grooming?: A Little Help from another person toileting, which includes using toliet, bedpan, or urinal?: A Lot Help from another person bathing (including washing, rinsing, drying)?: A Lot Help from another person to put on and taking off regular upper body clothing?: A Lot Help from another person to put on and taking off regular lower body clothing?: Total 6 Click Score: 13    End of Session    OT Visit Diagnosis: Unsteadiness on feet (R26.81);Muscle weakness (generalized) (M62.81)   Activity Tolerance Patient tolerated treatment well   Patient Left in bed;with call bell/phone within reach   Nurse Communication Mobility status        Time: 9518-8416 OT Time Calculation (min): 11 min  Charges: OT General Charges $OT Visit: 1 Visit OT Treatments $Therapeutic Activity: 8-22 mins  Lou Cal, [place] Pager [phone] Office [phone]   Raymondo Band 12/13/2018, 4:27 PM

## 2018-12-13 NOTE — Progress Notes (Signed)
Renal Navigator notified OP HD clinic/SW of patient's plan for discharge to CIR today to provide continuity of care.  Alphonzo Cruise, [place] Renal Navigator  [phone]

## 2018-12-13 NOTE — Discharge Instructions (Signed)

## 2018-12-13 NOTE — Progress Notes (Signed)
[facility] ROUNDING NOTE   Subjective:   This is a 43 year old gentleman was admitted with pulmonary edema and acute hypoxic respiratory failure with aspiration and shock requiring a brief treatment with CRRT 12/01/2018 - 12/03/2018.  End-stage renal disease Tuesday Thursday Saturday dialysis  He has a history of failed renal transplant 10/2012 followed by [facility], not on any immunosuppressant drugs.  He has a history of hypertension diabetes mellitus type 2 and congestive heart failure with morbid obesity and obstructive sleep apnea on CPAP.  He also has a history of gastroesophageal reflux disease.  Patient appears to be much better he is calm.  He states he feels weak this morning.  Last dialysis treatment 12/11/2018 with removal of 3 L  No new c/o's today, seen in pt's room, in good spirits   pro-stat 60 cc 3 times daily, insulin sliding scale, Velphoro 2 g 3 times daily with meals, darbepoetin 100 mcg q. Tuesday      IV ferric gluconate 125 mg Tuesday Thursday Saturday.  Lopressor 25 mg twice daily  Chest x-ray 12/06/2018 endotracheal tube and NG tube in stable position left IJ stable cardiomegaly  Ultrasound abdomen 12/02/2018 echogenicity of liver no other acute pathology unremarkable globe gallbladder with no evidence of dilation  2D echo EF 60 to 65% 12/01/2018   Objective:  Vital signs in last 24 hours:  Temp:  [97.5 F (36.4 C)-99 F (37.2 C)] 97.8 F (36.6 C) (07/06 0826) Pulse Rate:  [80-91] 90 (07/06 0826) Resp:  [12-25] 22 (07/06 1012) BP: (132-170)/(87-101) 140/101 (07/06 1012) SpO2:  [92 %-100 %] 97 % (07/06 1012)  Weight change:  Filed Weights   12/11/18 1508 12/11/18 1824 12/12/18 0612  Weight: (!) 137.8 kg 134.8 kg 135.5 kg    Intake/Output: I/O last 3 completed shifts: In: 200 [P.O.:200] Out: -    Intake/Output this shift:  No intake/output data recorded.  Exam Alert and calm, seen in room CVS- RRR no JVP faint systolic murmur RS-some fine  inspiratory crackles heard at the bases ABD- BS present soft non-distended EXT- no peripheral edema  L thigh AVG+bruit  Outpt HD:TTS [place] 4.5h 500/1.5 146kg 2/2.25 bath Hep 11400 L thigh AVG - parsabiv  - mircera 150 q 2wks - hectorol 4 ug   Assessment/ Plan:   ESRD-Tuesday Thursday Saturday dialysis.  Did receive dialysis Sunday due to high volume Saturday.  He received a brief period of CRRT on admission due to hypotension and shock.  Underwent successful dialysis 12/11/2018 with ultrafiltration of 3 L.  Next dialysis treatment will be scheduled for Tuesday.   Anemia appears stable .  Darbepoetin 100 mcg q. Thursday, T sats 19% patient is receiving IV iron 3 times a week on dialysis  Bones last PTH 12/06/2018 was 136 continues on Velphoro 1 g 3 times daily with meals.  We will increase Velphoro to 2 g with meals.  Vent dependent resp failure/ shock / aspiration PNA - resolved  Diastolic dysfunction stable  Diabetes mellitus insulin sliding scale  History of paroxysmal atrial fibrillation   Kelly Splinter, MD 12/13/2018, 11:08 AM    Basic Metabolic Panel: Recent Labs  Lab 12/08/18 0313 12/09/18 1119 12/09/18 1500 12/10/18 0835 12/10/18 0855 12/11/18 1605 12/12/18 1033 12/13/18 0544  NA 139 136  --   --  137 135 133*  --   K 4.1 4.3  --   --  4.4 4.6 4.3  --   CL 99 98  --   --  99  95* 95*  --   CO2 22 21*  --   --  22 23 25   --   GLUCOSE 98 113*  --   --  87 118* 104*  --   BUN 110* 128*  --   --  85* 103* 79*  --   CREATININE 9.41* 12.05*  --   --  10.17* 12.09* 10.55*  --   CALCIUM 9.0 8.9  --   --  9.2 9.0 9.0  --   MG 2.7*  --  3.1*  --   --   --   --  2.8*  PHOS 6.3* 7.3* 7.4* 6.9*  --  8.4*  --   --     Liver Function Tests: Recent Labs  Lab 12/09/18 1119 12/10/18 0855 12/11/18 1605 12/12/18 1033  AST  --  36  --  25  ALT  --  267*  --  151*  ALKPHOS  --  91  --  81  BILITOT  --  1.2  --  1.1  PROT  --  6.5  --  6.9  ALBUMIN 2.7* 2.6*  2.4* 2.6*   No results for input(s): LIPASE, AMYLASE in the last 168 hours. No results for input(s): AMMONIA in the last 168 hours.  CBC: Recent Labs  Lab 12/07/18 0544 12/08/18 0313 12/09/18 1500 12/10/18 0855 12/13/18 0544  WBC 11.2* 9.8 10.8* 13.7* 10.0  NEUTROABS  --   --   --  9.4* 6.4  HGB 8.2* 8.2* 8.4* 8.1* 8.2*  HCT 26.7* 26.6* 26.8* 25.4* 26.9*  MCV 89.0 89.9 87.6 86.4 87.9  PLT 162 179 230 231 327    Cardiac Enzymes: No results for input(s): CKTOTAL, CKMB, CKMBINDEX, TROPONINI in the last 168 hours.  Medications:   . sodium chloride 10 mL/hr at 12/08/18 0600  . sodium chloride 10 mL/hr at 12/07/18 0938  . ferric gluconate (FERRLECIT/NULECIT) IV Stopped (12/11/18 1905)   . amLODipine  5 mg Oral Daily  . B-complex with vitamin C  1 tablet Per Tube Daily  . Chlorhexidine Gluconate Cloth  6 each Topical Q0600  . darbepoetin (ARANESP) injection - DIALYSIS  100 mcg Intravenous Q Tue-HD  . feeding supplement (PRO-STAT SUGAR FREE 64)  30 mL Oral BID  . heparin injection (subcutaneous)  5,000 Units Subcutaneous Q8H  . insulin aspart  0-9 Units Subcutaneous Q4H  . mouth rinse  15 mL Mouth Rinse BID  . metoprolol tartrate  25 mg Oral BID  . senna-docusate  1 tablet Per Tube Daily  . sucroferric oxyhydroxide  2,000 mg Oral TID WC   sodium chloride, acetaminophen **OR** acetaminophen, albuterol, bisacodyl, haloperidol lactate, labetalol, ondansetron **OR** ondansetron (ZOFRAN) IV

## 2018-12-13 NOTE — Progress Notes (Signed)
PROGRESS NOTE  [name] KWI:[id number] DOB: 06-Apr-1976 DOA: 11/30/2018 PCP: Luetta Nutting, DO  HPI/Recap of past 24 hours: [name] is a 43 y.o. male  with past medical history significant for OSA on CPAP, paroxysmal A. fib, hypertension, chronic diastolic CHF with EF 55 to 60% with grade 2 diastolic dysfunction, ESRD on HD Tuesday Thursday and Saturday, failed renal transplant in 2014, type 2 diabetes, hypothyroidism who presented to [facility] ED 11/30/18 with respiratory distress and hypoxic respiratory failure presumed due to acute pulmonary edema after missing HD. Also possible HCAP. Required intubation in ED. Developed ARDS and florid septic shock requiring epinephrine drip and multiorgan failure.  Transferred to [facility] service on 12/09/2018.  12/13/18:  Patient seen and examined.  He has no complaints at all.  Assessment/Plan: Principal Problem:   Sepsis ([place]) Active Problems:   Essential hypertension   Acute respiratory failure with hypoxia (HCC)   Acute encephalopathy   Septic shock (HCC)   Pressure injury of skin  Resolving ARDS, pulmonary edema in the setting of missed hemodialysis post extubation on 12/06/2018 Self extubated on 12/06/2018 Currently on room air and saturating more than 92%. Continue renal diet with fluid restriction.  Nephrology on board and he is getting his intermittent hemodialysis scheduled on TTS schedule.  ESRD on HD Tuesday Thursday Saturday Nephrology following.  Continue dialysis as scheduled on TTS.  Uncontrolled hypertension/tachycardia Not on antihypertensive medications at home.  He was started on Lopressor 12.5 mg here which was increased to 25 mg twice daily due to increased blood pressure.  Blood pressure was still elevated so I started him on amlodipine yesterday on 12/12/2018 and now his blood pressure is much better so we will continue this management.  Resolved sinus tachycardia now on beta-blocker, unclear etiology Per H&P patient  uses THC No UDS done in admission  OSA on CPAP CPAP nightly  Anemia of chronic disease Hemoglobin stable  No sign of overt bleeding Aranesp per Nephrology IV iron supplement per nephrology  Resolved acute metabolic encephalopathy Continue to minimize sedation  History of noncompliance Education on the importance of compliance with hemodialysis and CPAP machine  Morbid obesity BMI 40 Recommend weight loss outpatient with healthy dieting and regular physical activity.    Physical debility/ambulatory dysfunction PT OT assessed and recommended CIR. he has been accepted for CIR pending insurance authorization.   Code Status: Full code  Family Communication: None at bedside  Disposition Plan: Discharged to CIR.  He has been accepted however insurance authorization is pending.  He is medically stable.  Consultants:  Nephrology  Procedures:  Hemodialysis  Antimicrobials:  None  DVT prophylaxis: Subcu heparin 3 times daily   Objective: Vitals:   12/13/18 0320 12/13/18 0826 12/13/18 1012 12/13/18 1218  BP: 132/87 (!) 165/95 (!) 140/101 (!) 151/104  Pulse: 84 90  78  Resp: (!) 22 16 (!) 22 18  Temp: 97.8 F (36.6 C) 97.8 F (36.6 C)  98.4 F (36.9 C)  TempSrc: Axillary     SpO2: 96% 100% 97% 100%  Weight:      Height:        Intake/Output Summary (Last 24 hours) at 12/13/2018 1611 Last data filed at 12/13/2018 1010 Gross per 24 hour  Intake 320 ml  Output -  Net 320 ml   Filed Weights   12/11/18 1508 12/11/18 1824 12/12/18 0612  Weight: (!) 137.8 kg 134.8 kg 135.5 kg    Exam: General exam: Appears calm and comfortable, morbidly obese Respiratory  system: Clear to auscultation. Respiratory effort normal. Cardiovascular system: S1 & S2 heard, RRR. No JVD, murmurs, rubs, gallops or clicks. No pedal edema. Gastrointestinal system: Abdomen is nondistended, soft and nontender. No organomegaly or masses felt. Normal bowel sounds heard. Central nervous system:  Alert and oriented. No focal neurological deficits. Extremities: Symmetric 5 x 5 power. Skin: No rashes, lesions or ulcers Psychiatry: Judgement and insight appear normal. Mood & affect appropriate.   Data Reviewed: CBC: Recent Labs  Lab 12/07/18 0544 12/08/18 0313 12/09/18 1500 12/10/18 0855 12/13/18 0544  WBC 11.2* 9.8 10.8* 13.7* 10.0  NEUTROABS  --   --   --  9.4* 6.4  HGB 8.2* 8.2* 8.4* 8.1* 8.2*  HCT 26.7* 26.6* 26.8* 25.4* 26.9*  MCV 89.0 89.9 87.6 86.4 87.9  PLT 162 179 230 231 270   Basic Metabolic Panel: Recent Labs  Lab 12/08/18 0313 12/09/18 1119 12/09/18 1500 12/10/18 0835 12/10/18 0855 12/11/18 1605 12/12/18 1033 12/13/18 0544  NA 139 136  --   --  137 135 133*  --   K 4.1 4.3  --   --  4.4 4.6 4.3  --   CL 99 98  --   --  99 95* 95*  --   CO2 22 21*  --   --  22 23 25   --   GLUCOSE 98 113*  --   --  87 118* 104*  --   BUN 110* 128*  --   --  85* 103* 79*  --   CREATININE 9.41* 12.05*  --   --  10.17* 12.09* 10.55*  --   CALCIUM 9.0 8.9  --   --  9.2 9.0 9.0  --   MG 2.7*  --  3.1*  --   --   --   --  2.8*  PHOS 6.3* 7.3* 7.4* 6.9*  --  8.4*  --   --    GFR: Estimated Creatinine Clearance: 13.1 mL/min (A) (by C-G formula based on SCr of 10.55 mg/dL (H)). Liver Function Tests: Recent Labs  Lab 12/09/18 1119 12/10/18 0855 12/11/18 1605 12/12/18 1033  AST  --  36  --  25  ALT  --  267*  --  151*  ALKPHOS  --  91  --  81  BILITOT  --  1.2  --  1.1  PROT  --  6.5  --  6.9  ALBUMIN 2.7* 2.6* 2.4* 2.6*   No results for input(s): LIPASE, AMYLASE in the last 168 hours. No results for input(s): AMMONIA in the last 168 hours. Coagulation Profile: No results for input(s): INR, PROTIME in the last 168 hours. Cardiac Enzymes: No results for input(s): CKTOTAL, CKMB, CKMBINDEX, TROPONINI in the last 168 hours. BNP (last 3 results) No results for input(s): PROBNP in the last 8760 hours. HbA1C: No results for input(s): HGBA1C in the last 72 hours. CBG:  Recent Labs  Lab 12/12/18 1945 12/12/18 2331 12/13/18 0323 12/13/18 0731 12/13/18 1216  GLUCAP 115* 89 95 71 94   Lipid Profile: No results for input(s): CHOL, HDL, LDLCALC, TRIG, CHOLHDL, LDLDIRECT in the last 72 hours. Thyroid Function Tests: No results for input(s): TSH, T4TOTAL, FREET4, T3FREE, THYROIDAB in the last 72 hours. Anemia Panel: No results for input(s): VITAMINB12, FOLATE, FERRITIN, TIBC, IRON, RETICCTPCT in the last 72 hours. Urine analysis:    Component Value Date/Time   COLORURINE STRAW (A) 05/10/2018 1811   APPEARANCEUR CLEAR 05/10/2018 1811   LABSPEC 1.012 05/10/2018 1811  PHURINE 7.0 05/10/2018 1811   GLUCOSEU 150 (A) 05/10/2018 1811   HGBUR SMALL (A) 05/10/2018 1811   BILIRUBINUR NEGATIVE 11/03/2018 1515   KETONESUR NEGATIVE 05/10/2018 1811   PROTEINUR Positive (A) 11/03/2018 1515   PROTEINUR >=300 (A) 05/10/2018 1811   UROBILINOGEN 0.2 11/03/2018 1515   NITRITE NEGATIVE 11/03/2018 1515   NITRITE NEGATIVE 05/10/2018 1811   LEUKOCYTESUR Small (1+) (A) 11/03/2018 1515   Sepsis Labs: @LABRCNTIP (procalcitonin:4,lacticidven:4)  ) No results found for this or any previous visit (from the past 240 hour(s)).    Studies: No results found.  Scheduled Meds: . amLODipine  5 mg Oral Daily  . B-complex with vitamin C  1 tablet Per Tube Daily  . Chlorhexidine Gluconate Cloth  6 each Topical Q0600  . Chlorhexidine Gluconate Cloth  6 each Topical Q0600  . collagenase   Topical Daily  . darbepoetin (ARANESP) injection - DIALYSIS  100 mcg Intravenous Q Tue-HD  . feeding supplement (PRO-STAT SUGAR FREE 64)  30 mL Oral BID  . heparin injection (subcutaneous)  5,000 Units Subcutaneous Q8H  . insulin aspart  0-9 Units Subcutaneous Q4H  . mouth rinse  15 mL Mouth Rinse BID  . metoprolol tartrate  25 mg Oral BID  . senna-docusate  1 tablet Per Tube Daily  . sucroferric oxyhydroxide  2,000 mg Oral TID WC    Continuous Infusions: . sodium chloride 10 mL/hr at  12/08/18 0600  . sodium chloride 10 mL/hr at 12/07/18 0938  . ferric gluconate (FERRLECIT/NULECIT) IV Stopped (12/11/18 1905)     LOS: 13 days     Darliss Cheney, MD [facility] Pager [phone]  If 7PM-7AM, please contact night-coverage [email or web address] Password [email or web address] 12/13/2018, 4:11 PM

## 2018-12-13 NOTE — Progress Notes (Signed)
Physical Therapy Treatment Patient Details Name: [name] MRN: [id number] DOB: 1976-05-20 Today's Date: 12/13/2018    History of Present Illness 43 y.o. male admitted on 11/30/18 for SOB, COVID testing (-).  Pt dx with sepsis due to CAP, acute respiratory failure with hypoxia initally on BiPap, but ultimately intubated 11/30/18 and self extubated 12/06/18.  Pt normally on HD T, TH, Sat, but had to be on CRRT 6/24-6/26/20. Pt dx with ARDS, acute encepholopathy, septic shock due to aspiration,  RV dysfunction (related to ARDS), transaminitis, anemia of chronic disease, and elevated troponin (thought to be demand ischemia).  Pt with other significant PMH of DM, failed kidney transplant at [facility], PAF, morbid obesity, HTN, DM, CHF, sleep apnea.      PT Comments    Pt making good progress. Continue to recommend CIR for further therapy so pt can return home and eventually back to work.    Follow Up Recommendations  CIR     Equipment Recommendations  Rolling walker with 5" wheels;3in1 (PT)    Recommendations for Other Services Rehab consult     Precautions / Restrictions Precautions Precautions: Fall Restrictions Weight Bearing Restrictions: No    Mobility  Bed Mobility Overal bed mobility: Needs Assistance Bed Mobility: Supine to Sit     Supine to sit: Min guard;HOB elevated     General bed mobility comments: Assist for safety and lines  Transfers Overall transfer level: Needs assistance Equipment used: Rolling walker (2 wheeled) Transfers: Sit to/from Stand Sit to Stand: Min assist         General transfer comment: Assist to bring hips up and for balance. Verbal cues for hand placement.   Ambulation/Gait Ambulation/Gait assistance: Min assist Gait Distance (Feet): 125 Feet Assistive device: Rolling walker (2 wheeled) Gait Pattern/deviations: Step-through pattern;Decreased step length - right;Decreased step length - left;Trunk flexed Gait velocity: decr Gait  velocity interpretation: 1.31 - 2.62 ft/sec, indicative of limited community ambulator General Gait Details: Assist for balance and support. Verbal cues to stand more erect and look up   Stairs             Wheelchair Mobility    Modified Rankin (Stroke Patients Only)       Balance Overall balance assessment: Needs assistance Sitting-balance support: No upper extremity supported;Feet supported Sitting balance-Leahy Scale: Fair     Standing balance support: Bilateral upper extremity supported Standing balance-Leahy Scale: Poor Standing balance comment: walker and min guard for static standing                            Cognition Arousal/Alertness: Awake/alert Behavior During Therapy: WFL for tasks assessed/performed Overall Cognitive Status: Within Functional Limits for tasks assessed                                        Exercises      General Comments General comments (skin integrity, edema, etc.): Pt on RA for amb with SpO2 >94%      Pertinent Vitals/Pain Pain Assessment: No/denies pain    Home Living                      Prior Function            PT Goals (current goals can now be found in the care plan section) Acute Rehab PT Goals Patient Stated  Goal: get stronger Progress towards PT goals: Progressing toward goals    Frequency    Min 3X/week      PT Plan Current plan remains appropriate    Co-evaluation              AM-PAC PT "6 Clicks" Mobility   Outcome Measure  Help needed turning from your back to your side while in a flat bed without using bedrails?: A Little Help needed moving from lying on your back to sitting on the side of a flat bed without using bedrails?: A Little Help needed moving to and from a bed to a chair (including a wheelchair)?: A Little Help needed standing up from a chair using your arms (e.g., wheelchair or bedside chair)?: A Little Help needed to walk in hospital  room?: A Little Help needed climbing 3-5 steps with a railing? : A Lot 6 Click Score: 17    End of Session   Activity Tolerance: Patient tolerated treatment well Patient left: with call bell/phone within reach;in chair Nurse Communication: Mobility status PT Visit Diagnosis: Muscle weakness (generalized) (M62.81);Difficulty in walking, not elsewhere classified (R26.2);Other symptoms and signs involving the nervous system (R29.898)     Time: 7414-2395 PT Time Calculation (min) (ACUTE ONLY): 17 min  Charges:  $Gait Training: 8-22 mins                     Dale Pager [phone] Office [place] 12/13/2018, 1:59 PM

## 2018-12-13 NOTE — Discharge Summary (Signed)
Physician Discharge Summary  [name] WCB:[id number] DOB: 1976-05-09 DOA: 11/30/2018  PCP: Luetta Nutting, DO  Admit date: 11/30/2018 Discharge date: 12/13/2018  Admitted From: Home Disposition: CIR  Recommendations for Outpatient Follow-up:  1. Follow up with PCP in 1-2 weeks 2. Please obtain BMP/CBC in one week 3. Please follow up on the following pending results:  Home Health: None Equipment/Devices: None  Discharge Condition: Stable CODE STATUS: Full code Diet recommendation: Annual diet  Subjective: Patient seen and examined.  He has no complaints.  Brief/Interim Summary: Robert Millea Whittedis a 43 y.o.male with past medical history significant for OSA on CPAP, paroxysmal A. fib, hypertension, chronic diastolic CHF with EF 55 to 60% with grade 2 diastolic dysfunction, ESRD on HD Tuesday Thursday and Saturday, failed renal transplant in 2014, type 2 diabetes, hypothyroidism who presented to [facility] ED 11/30/18 with respiratory distress and hypoxic respiratory failure presumed due to acute pulmonary edema after missing HD. Also possible HCAP. Required intubation in ED. Developed ARDS and florid septic shock requiring epinephrine drip and multiorgan failure.  He was started on IV antibiotics and completed his course of antibiotics during this hospitalization.  He was then transferred under [facility] on medical floor starting 12/09/2018.  He remained medically stable since then.  Received his scheduled dialysis.  He was evaluated by PT OT and they recommended discharging to acute rehab.  He was evaluated by acute rehab and was accepted and finally we received his insurance authorization today so he will be discharged.  Discharge Diagnoses:  Principal Problem:   Sepsis ([place]) Active Problems:   Essential hypertension   Acute respiratory failure with hypoxia (HCC)   Acute encephalopathy   Septic shock (HCC)   Pressure injury of skin    Discharge Instructions  Discharge Instructions     Discharge patient   Complete by: As directed    Discharge disposition: [facility]-Rehab Facility   Discharge patient date: 12/13/2018     Allergies as of 12/13/2018   No Known Allergies     Medication List    TAKE these medications   acetaminophen 500 MG tablet Commonly known as: TYLENOL Take 1,000 mg by mouth every 6 (six) hours as needed for mild pain or headache.   amLODipine 5 MG tablet Commonly known as: NORVASC Take 1 tablet (5 mg total) by mouth daily. Start taking on: December 14, 2018   atorvastatin 20 MG tablet Commonly known as: LIPITOR Take 20 mg by mouth daily.   imiquimod 5 % cream Commonly known as: Aldara Apply thin layer to affected areas three times per week prior to bed.   metoprolol tartrate 25 MG tablet Commonly known as: LOPRESSOR Take 1 tablet (25 mg total) by mouth 2 (two) times daily.   Velphoro 500 MG chewable tablet Generic drug: sucroferric oxyhydroxide Chew 1,000 mg by mouth 3 (three) times daily with meals.      Follow-up Information    Luetta Nutting, DO Follow up in 1 week(s).   Specialty: Family Medicine Contact information: [name] [address] [phone]          No Known Allergies  Consultations: Nephrology   Procedures/Studies: Dg Chest Port 1 View  Result Date: 12/07/2018 CLINICAL DATA:  Respiratory failure. EXAM: PORTABLE CHEST 1 VIEW COMPARISON:  12/06/2018. FINDINGS: Interim extubation removal of NG tube. Left subclavian line again noted coursing up the left IJ. Cardiomegaly with mild pulmonary venous congestion again noted. Stable bibasilar atelectasis/infiltrates. No pleural effusion or pneumothorax. IMPRESSION: 1. Interim extubation  removal of NG tube. Left subclavian line again noted coursing up the left IJ. 2. Cardiomegaly with mild pulmonary venous congestion again noted. No interim change. 3. Stable bibasilar atelectasis/infiltrates. No pleural effusion or pneumothorax noted. Electronically Signed    By: Marcello Moores  Register   On: 12/07/2018 06:37   Dg Chest Port 1 View  Result Date: 12/06/2018 CLINICAL DATA:  Acute respiratory failure. EXAM: PORTABLE CHEST 1 VIEW COMPARISON:  12/05/2018. FINDINGS: Endotracheal tube, NG tube noted stable position. Left subclavian line noted coursing up the left IJ. This is in unchanged position. Stable cardiomegaly. Stable bibasilar atelectasis. IMPRESSION: 1. Endotracheal tube and NG tube stable position. Left subclavian line courses up the left IJ in unchanged position. 2.  Stable cardiomegaly. 3.  Stable bibasilar atelectasis. Electronically Signed   By: Marcello Moores  Register   On: 12/06/2018 06:39   Dg Chest Port 1 View  Result Date: 12/05/2018 CLINICAL DATA:  Acute respiratory failure EXAM: PORTABLE CHEST 1 VIEW COMPARISON:  Chest x-rays dated 12/04/2018 and 12/03/2018 FINDINGS: Stable cardiomegaly. Endotracheal tube is adequately positioned with tip approximately 5 cm above the carina. Enteric tube passes below the diaphragm. LEFT subclavian central line with tip again directed upwards into the internal jugular vein. Streaky bibasilar opacities, most likely atelectasis. No new lung findings. No pleural effusion or pneumothorax seen. IMPRESSION: 1. Endotracheal tube adequately positioned with tip approximately 5 cm above the carina. 2. Stable cardiomegaly. 3. Streaky bibasilar opacities, most likely atelectasis. 4. LEFT subclavian central line with tip directed upwards into the LEFT internal jugular vein, as previously described. Electronically Signed   By: Franki Cabot M.D.   On: 12/05/2018 05:33   Dg Chest Port 1 View  Result Date: 12/04/2018 CLINICAL DATA:  Acute respiratory failure EXAM: PORTABLE CHEST 1 VIEW COMPARISON:  Chest x-rays dated 12/03/2018 and 12/02/2018. FINDINGS: Endotracheal tube remains well positioned with tip above the level of the carina. Enteric tube passes below the diaphragm. Grossly stable cardiomegaly. LEFT-sided subclavian line is directed  into the LEFT internal jugular vein. Today's study is hypoinspiratory. Associated mild atelectasis at the RIGHT lung base. Additional opacity at the LEFT lung base, most likely atelectasis and/or small pleural effusion. IMPRESSION: 1. Support apparatus appears stable in position. LEFT subclavian catheter is directed into the LEFT internal jugular vein and would consider repositioning. 2. Low lung volumes. Bibasilar opacities, LEFT greater than right, most likely atelectasis and/or small pleural effusion. If febrile, pneumonia could not be excluded. These results will be called to the ordering clinician or representative by the Radiologist Assistant, and communication documented in the PACS or zVision Dashboard. Electronically Signed   By: Franki Cabot M.D.   On: 12/04/2018 07:04   Dg Chest Port 1 View  Result Date: 12/03/2018 CLINICAL DATA:  Respiratory failure.  Hypoxia. EXAM: PORTABLE CHEST 1 VIEW COMPARISON:  12/02/2018. FINDINGS: Endotracheal tube and NG tube in stable position. Left subclavian catheter is again noted projected over the left internal jugular vein. Cardiomegaly. Interim partial clearing of bilateral pulmonary interstitial infiltrates/edema. No pleural effusion or pneumothorax. IMPRESSION: 1.  Lines and tubes in unchanged position. 2. Cardiomegaly. Interim partial clearing of bilateral pulmonary interstitial infiltrates/edema. Electronically Signed   By: Marcello Moores  Register   On: 12/03/2018 08:43   Dg Chest Port 1 View  Result Date: 12/02/2018 CLINICAL DATA:  Respiratory failure. EXAM: PORTABLE CHEST 1 VIEW COMPARISON:  Radiograph of December 01, 2018. FINDINGS: Stable cardiomediastinal silhouette. Endotracheal and nasogastric tubes are unchanged in position. Slightly improved bilateral lung opacities are noted suggesting  improving pneumonia. Dominant opacity remains in right lung base. Stable position of left subclavian catheter which is directed into left internal jugular vein. Bony thorax is  unremarkable. No pneumothorax or pleural effusion is noted. IMPRESSION: Mildly improved bilateral lung opacities are noted suggesting improving pneumonia. Electronically Signed   By: Marijo Conception M.D.   On: 12/02/2018 07:47   Dg Chest Port 1 View  Result Date: 12/01/2018 CLINICAL DATA:  Oxygen desaturation EXAM: PORTABLE CHEST 1 VIEW COMPARISON:  Yesterday FINDINGS: Diffuse airspace opacity which appears somewhat less dense than before, likely related to better lung volumes. Endotracheal tube tip is at the clavicular heads. The orogastric tube reaches the stomach. Left subclavian catheter rises in the IJ, tip not seen. This finding is known to the service based on prior documented communication. IMPRESSION: Stable hardware positioning including left subclavian catheter terminating in the left IJ. Mildly improved lung volumes but continued extensive airspace disease. Electronically Signed   By: Monte Fantasia M.D.   On: 12/01/2018 07:10   Dg Chest Port 1 View  Result Date: 11/30/2018 CLINICAL DATA:  43 year old male status post central line placement. EXAM: PORTABLE CHEST 1 VIEW COMPARISON:  Earlier radiograph dated 11/30/2018 FINDINGS: A left subclavian central line extends superiorly in the left internal jugular vein. Recommend removal and repositioning. Endotracheal tube remains above the carina and enteric tube extends inferiorly beyond the inferior margin of the image. Bilateral confluent airspace opacities with overall interval worsening since the prior radiograph. No pneumothorax. Small pleural effusions may be present. Stable cardiomegaly. No acute osseous pathology. IMPRESSION: 1. Left subclavian central line extends superiorly in the left internal jugular vein. Recommend removal and repositioning. 2. Interval worsening of the bilateral airspace opacities. 3. No pneumothorax. These results were called by telephone at the time of interpretation on 11/30/2018 at 7:07 pm to Dr. [name] , who  verbally acknowledged these results. Electronically Signed   By: Anner Crete M.D.   On: 11/30/2018 19:20   Dg Chest Portable 1 View  Result Date: 11/30/2018 CLINICAL DATA:  Intubation.  ESRD EXAM: PORTABLE CHEST 1 VIEW COMPARISON:  11/30/2018 FINDINGS: Endotracheal tube in good position.  NG tube enters the stomach. Diffuse bilateral airspace disease with progression from earlier today. No significant pleural effusion. IMPRESSION: Endotracheal tube in good position Significant progression of diffuse bilateral airspace disease which may represent edema or pneumonia. Electronically Signed   By: Franchot Gallo M.D.   On: 11/30/2018 15:23   Dg Chest Portable 1 View  Result Date: 11/30/2018 CLINICAL DATA:  Fever and hypoxia EXAM: PORTABLE CHEST 1 VIEW COMPARISON:  May 10, 2018 FINDINGS: There is airspace consolidation in the right base region. There is more subtle airspace opacity in the left upper lobe. Heart is enlarged with mild pulmonary venous hypertension. No adenopathy. There is aortic atherosclerosis. No bone lesions. IMPRESSION: Areas of airspace opacity felt to represent pneumonia in the right base and left upper lobe regions. Consolidation is greatest in the right base region. There is cardiomegaly with a degree of pulmonary vascular congestion. No adenopathy evident. Aortic Atherosclerosis (ICD10-I70.0). Electronically Signed   By: Lowella Grip III M.D.   On: 11/30/2018 09:09   US Abdomen Limited Ruq  Result Date: 12/02/2018 CLINICAL DATA:  Elevated LFTs. EXAM: ULTRASOUND ABDOMEN LIMITED RIGHT UPPER QUADRANT COMPARISON:  None. FINDINGS: Gallbladder: No gallstones or gallbladder wall thickening. No pericholecystic fluid. Common bile duct: Diameter: 5 mm Liver: Liver appears echogenic suggesting fatty deposition. Portal vein is patent on color Doppler imaging  with normal direction of blood flow towards the liver. Other: Trace fluid noted adjacent to the liver. IMPRESSION: Patient is on  a ventilator which limits assessment due to inability to reposition as normal part of the exam. Within this limitation, gallbladder is unremarkable and there is no evidence for biliary dilatation. Increased echogenicity of liver parenchyma suggests fatty deposition. Trace fluid adjacent to the liver. Electronically Signed   By: Misty Stanley M.D.   On: 12/02/2018 19:31      Discharge Exam: Vitals:   12/13/18 1012 12/13/18 1218  BP: (!) 140/101 (!) 151/104  Pulse:  78  Resp: (!) 22 18  Temp:  98.4 F (36.9 C)  SpO2: 97% 100%   Vitals:   12/13/18 0320 12/13/18 0826 12/13/18 1012 12/13/18 1218  BP: 132/87 (!) 165/95 (!) 140/101 (!) 151/104  Pulse: 84 90  78  Resp: (!) 22 16 (!) 22 18  Temp: 97.8 F (36.6 C) 97.8 F (36.6 C)  98.4 F (36.9 C)  TempSrc: Axillary     SpO2: 96% 100% 97% 100%  Weight:      Height:        General: Pt is alert, awake, not in acute distress, morbidly obese Cardiovascular: RRR, S1/S2 +, no rubs, no gallops Respiratory: CTA bilaterally, no wheezing, no rhonchi Abdominal: Soft, NT, ND, bowel sounds + Extremities: no edema, no cyanosis    The results of significant diagnostics from this hospitalization (including imaging, microbiology, ancillary and laboratory) are listed below for reference.     Microbiology: No results found for this or any previous visit (from the past 240 hour(s)).   Labs: BNP (last 3 results) No results for input(s): BNP in the last 8760 hours. Basic Metabolic Panel: Recent Labs  Lab 12/08/18 0313 12/09/18 1119 12/09/18 1500 12/10/18 0835 12/10/18 0855 12/11/18 1605 12/12/18 1033 12/13/18 0544  NA 139 136  --   --  137 135 133*  --   K 4.1 4.3  --   --  4.4 4.6 4.3  --   CL 99 98  --   --  99 95* 95*  --   CO2 22 21*  --   --  22 23 25   --   GLUCOSE 98 113*  --   --  87 118* 104*  --   BUN 110* 128*  --   --  85* 103* 79*  --   CREATININE 9.41* 12.05*  --   --  10.17* 12.09* 10.55*  --   CALCIUM 9.0 8.9  --   --   9.2 9.0 9.0  --   MG 2.7*  --  3.1*  --   --   --   --  2.8*  PHOS 6.3* 7.3* 7.4* 6.9*  --  8.4*  --   --    Liver Function Tests: Recent Labs  Lab 12/09/18 1119 12/10/18 0855 12/11/18 1605 12/12/18 1033  AST  --  36  --  25  ALT  --  267*  --  151*  ALKPHOS  --  91  --  81  BILITOT  --  1.2  --  1.1  PROT  --  6.5  --  6.9  ALBUMIN 2.7* 2.6* 2.4* 2.6*   No results for input(s): LIPASE, AMYLASE in the last 168 hours. No results for input(s): AMMONIA in the last 168 hours. CBC: Recent Labs  Lab 12/07/18 0544 12/08/18 0313 12/09/18 1500 12/10/18 0855 12/13/18 0544  WBC 11.2* 9.8 10.8* 13.7* 10.0  NEUTROABS  --   --   --  9.4* 6.4  HGB 8.2* 8.2* 8.4* 8.1* 8.2*  HCT 26.7* 26.6* 26.8* 25.4* 26.9*  MCV 89.0 89.9 87.6 86.4 87.9  PLT 162 179 230 231 327   Cardiac Enzymes: No results for input(s): CKTOTAL, CKMB, CKMBINDEX, TROPONINI in the last 168 hours. BNP: Invalid input(s): POCBNP CBG: Recent Labs  Lab 12/12/18 1945 12/12/18 2331 12/13/18 0323 12/13/18 0731 12/13/18 1216  GLUCAP 115* 89 95 71 94   D-Dimer No results for input(s): DDIMER in the last 72 hours. Hgb A1c No results for input(s): HGBA1C in the last 72 hours. Lipid Profile No results for input(s): CHOL, HDL, LDLCALC, TRIG, CHOLHDL, LDLDIRECT in the last 72 hours. Thyroid function studies No results for input(s): TSH, T4TOTAL, T3FREE, THYROIDAB in the last 72 hours.  Invalid input(s): FREET3 Anemia work up No results for input(s): VITAMINB12, FOLATE, FERRITIN, TIBC, IRON, RETICCTPCT in the last 72 hours. Urinalysis    Component Value Date/Time   COLORURINE STRAW (A) 05/10/2018 1811   APPEARANCEUR CLEAR 05/10/2018 1811   LABSPEC 1.012 05/10/2018 1811   PHURINE 7.0 05/10/2018 1811   GLUCOSEU 150 (A) 05/10/2018 1811   HGBUR SMALL (A) 05/10/2018 1811   BILIRUBINUR NEGATIVE 11/03/2018 1515   KETONESUR NEGATIVE 05/10/2018 1811   PROTEINUR Positive (A) 11/03/2018 1515   PROTEINUR >=300 (A)  05/10/2018 1811   UROBILINOGEN 0.2 11/03/2018 1515   NITRITE NEGATIVE 11/03/2018 1515   NITRITE NEGATIVE 05/10/2018 1811   LEUKOCYTESUR Small (1+) (A) 11/03/2018 1515   Sepsis Labs Invalid input(s): PROCALCITONIN,  WBC,  LACTICIDVEN Microbiology No results found for this or any previous visit (from the past 240 hour(s)).   Time coordinating discharge: Over 30 minutes  SIGNED:   Darliss Cheney, MD  [facility] 12/13/2018, 4:21 PM Pager [phone]  If 7PM-7AM, please contact night-coverage [email or web address] Password [email or web address]

## 2018-12-14 ENCOUNTER — Inpatient Hospital Stay (HOSPITAL_COMMUNITY): Payer: 59 | Admitting: Rehabilitation

## 2018-12-14 ENCOUNTER — Inpatient Hospital Stay (HOSPITAL_COMMUNITY): Payer: 59

## 2018-12-14 DIAGNOSIS — D638 Anemia in other chronic diseases classified elsewhere: Secondary | ICD-10-CM

## 2018-12-14 DIAGNOSIS — E1169 Type 2 diabetes mellitus with other specified complication: Secondary | ICD-10-CM

## 2018-12-14 DIAGNOSIS — G4733 Obstructive sleep apnea (adult) (pediatric): Secondary | ICD-10-CM

## 2018-12-14 DIAGNOSIS — N186 End stage renal disease: Secondary | ICD-10-CM

## 2018-12-14 DIAGNOSIS — Z9119 Patient's noncompliance with other medical treatment and regimen: Secondary | ICD-10-CM

## 2018-12-14 DIAGNOSIS — R5381 Other malaise: Principal | ICD-10-CM

## 2018-12-14 DIAGNOSIS — E669 Obesity, unspecified: Secondary | ICD-10-CM

## 2018-12-14 DIAGNOSIS — Z992 Dependence on renal dialysis: Secondary | ICD-10-CM

## 2018-12-14 DIAGNOSIS — I10 Essential (primary) hypertension: Secondary | ICD-10-CM

## 2018-12-14 LAB — CBC
HCT: 24.5 % — ABNORMAL LOW (ref 39.0–52.0)
Hemoglobin: 7.7 g/dL — ABNORMAL LOW (ref 13.0–17.0)
MCH: 27.3 pg (ref 26.0–34.0)
MCHC: 31.4 g/dL (ref 30.0–36.0)
MCV: 86.9 fL (ref 80.0–100.0)
Platelets: 284 10*3/uL (ref 150–400)
RBC: 2.82 MIL/uL — ABNORMAL LOW (ref 4.22–5.81)
RDW: 18.8 % — ABNORMAL HIGH (ref 11.5–15.5)
WBC: 8.3 10*3/uL (ref 4.0–10.5)
nRBC: 0 % (ref 0.0–0.2)

## 2018-12-14 LAB — RENAL FUNCTION PANEL
Albumin: 2.5 g/dL — ABNORMAL LOW (ref 3.5–5.0)
Anion gap: 18 — ABNORMAL HIGH (ref 5–15)
BUN: 119 mg/dL — ABNORMAL HIGH (ref 6–20)
CO2: 21 mmol/L — ABNORMAL LOW (ref 22–32)
Calcium: 9 mg/dL (ref 8.9–10.3)
Chloride: 91 mmol/L — ABNORMAL LOW (ref 98–111)
Creatinine, Ser: 14.95 mg/dL — ABNORMAL HIGH (ref 0.61–1.24)
GFR calc Af Amer: 4 mL/min — ABNORMAL LOW (ref >60)
GFR calc non Af Amer: 3 mL/min — ABNORMAL LOW (ref >60)
Glucose, Bld: 100 mg/dL — ABNORMAL HIGH (ref 70–99)
Phosphorus: 10.3 mg/dL — ABNORMAL HIGH (ref 2.5–4.6)
Potassium: 4.8 mmol/L (ref 3.5–5.1)
Sodium: 130 mmol/L — ABNORMAL LOW (ref 135–145)

## 2018-12-14 LAB — GLUCOSE, CAPILLARY
Glucose-Capillary: 110 mg/dL — ABNORMAL HIGH (ref 70–99)
Glucose-Capillary: 130 mg/dL — ABNORMAL HIGH (ref 70–99)
Glucose-Capillary: 77 mg/dL (ref 70–99)
Glucose-Capillary: 78 mg/dL (ref 70–99)
Glucose-Capillary: 82 mg/dL (ref 70–99)
Glucose-Capillary: 84 mg/dL (ref 70–99)

## 2018-12-14 MED ORDER — DARBEPOETIN ALFA 150 MCG/0.3ML IJ SOSY: 150.0000 ug | PREFILLED_SYRINGE | INTRAMUSCULAR | Status: DC

## 2018-12-14 MED ORDER — INSULIN ASPART 100 UNIT/ML ~~LOC~~ SOLN
0.0000 [IU] | Freq: Three times a day (TID) | SUBCUTANEOUS | Status: DC
  Administered 2018-12-14: 1 [IU] via SUBCUTANEOUS

## 2018-12-14 NOTE — IPOC Note (Signed)
Individualized overall Plan of Care [facility]) Patient Details Name: [name] MRN: [id number] DOB: [birth year]  Admitting Diagnosis: [facility] Problems: Active Problems:   Debility   Anemia of chronic disease     Functional Problem List: Nursing Behavior, Bowel, Edema, Endurance, Medication Management, Pain, Safety, Skin Integrity  PT Balance, Endurance, Motor, Safety, Skin Integrity  OT Balance, Pain, Safety, Endurance, Motor  SLP    TR         Basic ADL's: OT Bathing, Dressing, Toileting     Advanced  ADL's: OT Simple Meal Preparation     Transfers: PT Bed Mobility, Bed to Chair, Car, Manufacturing systems engineer, Metallurgist: PT Ambulation, Stairs     Additional Impairments: OT Fuctional Use of Upper Extremity  SLP        TR      Anticipated Outcomes Item Anticipated Outcome  Self Feeding no goal set  Swallowing      Basic self-care  mod I  Toileting  mod I   Bathroom Transfers mod I  Bowel/Bladder  Anuric, Min assist with bowel  Transfers  mod I with AD  Locomotion  mod I with AD  Communication     Cognition     Pain  <3 on a 0-10 scale  Safety/Judgment  1 assist with min assist from staff   Therapy Plan: PT Intensity: Minimum of 1-2 x/day ,45 to 90 minutes PT Frequency: 5 out of 7 days PT Duration Estimated Length of Stay: 7-9 days OT Intensity: Minimum of 1-2 x/day, 45 to 90 minutes OT Frequency: 5 out of 7 days OT Duration/Estimated Length of Stay: 5-7 days      Team Interventions: Nursing Interventions Patient/Family Education, Skin Care/Wound Management, Bowel Management, Discharge Planning, Disease Management/Prevention, Pain Management, Psychosocial Support, Medication Management  PT interventions Ambulation/gait training, Training and development officer, Community reintegration, Discharge planning, DME/adaptive equipment instruction, Functional mobility training, Neuromuscular re-education, Patient/family  education, Skin care/wound management, Stair training, Therapeutic Activities, Therapeutic Exercise, UE/LE Strength taining/ROM, Wheelchair propulsion/positioning(w/c propulsion for therex)  OT Interventions Balance/vestibular training, Discharge planning, Pain management, Self Care/advanced ADL retraining, Therapeutic Activities, UE/LE Coordination activities, Therapeutic Exercise, Skin care/wound managment, Patient/family education, Functional mobility training, Disease mangement/prevention, Community reintegration, Engineer, drilling, Psychosocial support, UE/LE Strength taining/ROM  SLP Interventions    TR Interventions    SW/CM Interventions     Barriers to Discharge MD  Medical stability, Weight, Hemodialysis and Medication compliance  Nursing Decreased caregiver support, Medical stability, Wound Care, Nutrition means, Lack of/limited family support, Medication compliance, Hemodialysis    PT Medical stability, Inaccessible home environment Question whether he will be able to ambulate/exercise due to decreased room in home.  OT Decreased caregiver support    SLP      SW       Team Discharge Planning: Destination: PT-Home ,OT- Home , SLP-  Projected Follow-up: PT-Outpatient PT, OT-  Outpatient OT, SLP-  Projected Equipment Needs: PT-Rolling walker with 5" wheels, OT- To be determined, SLP-  Equipment Details: PT- , OT-  Patient/family involved in discharge planning: PT- Patient,  OT-Patient, SLP-   MD ELOS: 5-7 days. Medical Rehab Prognosis:  Excellent Assessment: 43 year old right-handed male with history of end-stage renal disease with hemodialysis status post kidney transplant in May 2014 followed at [facility], poorly controlled hypertension, diabetes mellitus, diastolic congestive heart failure, morbid obesity with BMI of 40, obstructive sleep apnea on CPAP.  Presented 11/30/2018 with SOB, cough, and chest pain. He did have  episode of emesis that was  nonbloody.  Noted low-grade fever 100.4, severe HTN with blood pressure 214/122, oxygen saturation 72%, hemoglobin 8.1, creatinine 15.23, lactic acid 1.2, COVID screening negative, urine drug screen negative.  Chest x-ray showed areas of aspects of opacity thought to represent pneumonia in the right base and left upper lobes.  Patient did require intubation and self extubated 12/06/2018.  Oxygen saturations 92% on room air.  Patient initially with episodes of delirium and received Haldol. Hemodialysis ongoing as per renal services.  Hospital course further complicated by sinus tachycardia, which resolved with addition of beta blocker. Subcutaneous heparin for DVT prophylaxis.  Patient with gluteal cleft wound with Santyl dressing applied changed daily.  Tolerating a regular diet.  Patient with resulting functional deficits with mobility, endurance, self-care.  Will set goals for Mod I with PT/OT.  Due to the current state of emergency, patients may not be receiving their 3-hours of Medicare-mandated therapy.  See Team Conference Notes for weekly updates to the plan of care

## 2018-12-14 NOTE — Progress Notes (Addendum)
PHYSICAL MEDICINE & REHABILITATION PROGRESS NOTE  Subjective/Complaints: Patient seen sitting up in bed this AM.  He states he did not sleep well overnight (works night shift).  He is ready to begin therapies.   ROS: Denies CP, SOB, N/V/D  Objective: Vital Signs: Blood pressure (!) 143/81, pulse 96, temperature 98 F (36.7 C), resp. rate 17, height 6\' 2"  (1.88 m), weight (!) 138.6 kg, SpO2 97 %. No results found. Recent Labs    12/13/18 0544  WBC 10.0  HGB 8.2*  HCT 26.9*  PLT 327   Recent Labs    12/11/18 1605 12/12/18 1033  NA 135 133*  K 4.6 4.3  CL 95* 95*  CO2 23 25  GLUCOSE 118* 104*  BUN 103* 79*  CREATININE 12.09* 10.55*  CALCIUM 9.0 9.0    Physical Exam: BP (!) 143/81   Pulse 96   Temp 98 F (36.7 C)   Resp 17   Ht 6\' 2"  (1.88 m)   Wt (!) 138.6 kg   SpO2 97%   BMI 39.23 kg/m  Constitutional: No distress . Vital signs reviewed. HENT: Normocephalic.  Atraumatic. Eyes: EOMI. No discharge. Cardiovascular: No JVD. Respiratory: Normal effort. GI: Non-distended. Musc: No edema or tenderness in extremities Neurological: He is alert.  Patient is alert in no acute distress. Follow commands fair medical historian Motor: Grossly 4+/5 throughout, unchanged  Skin: Skin is warm and dry.  Psychiatric: He has a normal mood and affect. His behavior is normal.    Assessment/Plan: 1. Functional deficits secondary to debility which require 3+ hours per day of interdisciplinary therapy in a comprehensive inpatient rehab setting.  Physiatrist is providing close team supervision and 24 hour management of active medical problems listed below.  Physiatrist and rehab team continue to assess barriers to discharge/monitor patient progress toward functional and medical goals  Care Tool:  Bathing              Bathing assist       Upper Body Dressing/Undressing Upper body dressing        Upper body assist      Lower Body  Dressing/Undressing Lower body dressing            Lower body assist       Toileting Toileting Toileting Activity did not occur (Clothing management and hygiene only): N/A (no void or bm)  Toileting assist       Transfers Chair/bed transfer  Transfers assist           Locomotion Ambulation   Ambulation assist              Walk 10 feet activity   Assist           Walk 50 feet activity   Assist           Walk 150 feet activity   Assist           Walk 10 feet on uneven surface  activity   Assist           Wheelchair     Assist               Wheelchair 50 feet with 2 turns activity    Assist            Wheelchair 150 feet activity     Assist            Medical Problem List and Plan: 1.  Debility secondary to ARDS/pulmonary edema with acute  metabolic encephalopathy  Begin CIR evaluations  Team conference today to discuss current and goals and coordination of care, home and environmental barriers, and discharge planning with nursing, case manager, and therapies.  2.  Antithrombotics: -DVT/anticoagulation: Subcutaneous heparin.  Monitor for any bleeding episodes             -antiplatelet therapy: N/A 3. Pain Management: Tylenol as needed 4. Mood: Provide emotional support             -antipsychotic agents: N/A 5. Neuropsych: This patient is capable of making decisions on his own behalf. 6. Skin/Wound Care: Santyl to gluteal cleft wound daily.  Routine skin checks 7. Fluids/Electrolytes/Nutrition: Routine in and outs.  8.    Essential hypertension:  Uncontrolled at baseline. Patient with history of medical noncompliance.    Lopressor 25 mg twice daily, Norvasc 5 mg daily.  Follow cardiology services as needed.   Monitor with increased mobility 9.  End-stage renal disease.  Hemodialysis as directed 10.  Diabetes mellitus poor medical compliance.  SSI.  Check blood sugars before meals and at  bedtime.  Diabetic teaching.   Monitor with increased mobility 11. Morbid obesity:  BMI 38.35.  Dietary follow-up.  Encourage weight loss. 12. OSA.  CPAP as directed. 13. Anemia of chronic disease  Hemoglobin 8.2 on 7/6  Continue to monitor  LOS: 1 days A FACE TO FACE EVALUATION WAS PERFORMED  Mariellen Blaney Lorie Phenix 12/14/2018, 10:17 AM

## 2018-12-14 NOTE — Plan of Care (Signed)
Eval completed and goals set.

## 2018-12-14 NOTE — Evaluation (Signed)
Physical Therapy Assessment and Plan  Patient Details  Name: [name] MRN: [id number] Date of Birth: December 09, 1975  PT Diagnosis: Abnormal posture, Difficulty walking and Muscle weakness Rehab Potential: Good ELOS: 7-9 days   Today's Date: 12/14/2018 PT Individual Time: 8119-1478 PT Individual Time Calculation (min): 57 min    Problem List:  Patient Active Problem List   Diagnosis Date Noted  . Debility 12/13/2018  . Diabetes mellitus type 2 in obese ([place])   . Medically noncompliant   . Pressure injury of skin 12/12/2018  . Sepsis ([place]) 11/30/2018  . Acute respiratory failure with hypoxia ([place]) 11/30/2018  . Acute encephalopathy   . Septic shock ([place])   . Hematuria 11/03/2018  . Genital warts 11/03/2018  . Meralgia paresthetica of right side 08/13/2018  . ESRD on dialysis ([place]) 06/04/2018  . Acute kidney injury superimposed on CKD ([place]) 05/10/2018  . Acidosis 05/10/2018  . Anemia 05/10/2018  . Acute renal failure superimposed on stage 5 chronic kidney disease, not on chronic dialysis ([place])   . Type 2 diabetes mellitus without complication, without long-term current use of insulin ([place]) 01/25/2018  . Essential hypertension 01/25/2017  . Well adult exam 01/17/2016  . Morbid obesity ([place]) 01/17/2016  . Delayed graft function of kidney transplant due to reason other than ATN or rejection requiring acute dialysis ([place]) 11/02/2012  . Immunosuppression ([place]) 11/02/2012  . Kidney replaced by transplant 11/02/2012  . OSA (obstructive sleep apnea) 05/26/2011    Past Medical History:  Past Medical History:  Diagnosis Date  . Anemia    ESRD  . CHF (congestive heart failure) ([place])   . Diabetes mellitus without complication ([place])   . ESRD on hemodialysis ([place]) 06/07/2011   TTS [employer]. Started HD in 2006, got transplant in 2014 lasted until Dec 2019 then went back on HD.  Has L thigh AVG as of Jun 2020.  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   Marland Kitchen GERD  (gastroesophageal reflux disease)   . Hypertension 111/29/2012   pt states he takes no HTN meds  . Hypothyroidism, secondary   . Morbid obesity ([place])   . Paroxysmal atrial fibrillation ([facility])   . Sleep apnea 06/07/2011   Pt had sllep study done 2 weeks ago- Dr. Joellyn Quails.  Does not  have a CPAP at this time.   Past Surgical History:  Past Surgical History:  Procedure Laterality Date  . ARTERIOVENOUS GRAFT PLACEMENT  08/09/10   Left Thigh Graft by Dr. [name]  . AV FISTULA PLACEMENT Right 05/18/2018   Procedure: INSERTION OF ARTERIOVENOUS (AV) GORE-TEX GRAFT Left THIGH;  Surgeon: Waynetta Sandy, MD;  Location: [facility];  Service: Vascular;  Laterality: Right;  . CAPD REMOVAL  05/08/2011   Procedure: CONTINUOUS AMBULATORY PERITONEAL DIALYSIS  (CAPD) CATHETER REMOVAL;  Surgeon: [name], MD;  Location: [facility];  Service: General;  Laterality: N/A;  Removal of CAPD catheter, Dr. requests to go after 100  . INSERTION OF DIALYSIS CATHETER  10/05/10   Right Femoral Cath insertion by Dr. [name].  Pt ahas had several caths inserted.  . IR FLUORO GUIDE CV LINE RIGHT  05/11/2018  . IR US GUIDE VASC ACCESS RIGHT  05/11/2018  . KIDNEY TRANSPLANT  2014  . UPPER EXTREMITY ANGIOGRAPHY Bilateral 05/13/2018   Procedure: UPPER EXTREMITY ANGIOGRAPHY - bilarteral;  Surgeon: Marty Heck, MD;  Location: [facility];  Service: Cardiovascular;  Laterality: Bilateral;    Assessment & Plan Clinical Impression:  Patient is a 43 y.o. year old male with recent admission to the hospital with history of end-stage renal disease with hemodialysis status post kidney transplant in May 2014 followed at [facility], poorly controlled hypertension, diabetes mellitus, diastolic congestive heart failure, morbid obesity with BMI of 40, obstructive sleep apnea on CPAP.  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  Presented 11/30/2018 with SOB, cough, and chest pain. He did have episode of emesis that was nonbloody.  Noted low-grade fever 100.4, severe HTN with blood pressure 214/122, oxygen saturation 72%, hemoglobin 8.1, creatinine 15.23, lactic acid 1.2, COVID screening negative, urine drug screen negative.  Chest x-ray showed areas of aspects of opacity thought to represent pneumonia in the right base and left upper lobes.  Patient did require intubation and self extubated 12/06/2018.  Oxygen saturations 92% on room air.  Patient initially with episodes of delirium and received Haldol. Hemodialysis ongoing as per renal services.  Hospital course further complicated by sinus tachycardia, which resolved with addition of beta blocker. Subcutaneous heparin for DVT prophylaxis.  Patient with gluteal cleft wound with Santyl dressing applied changed daily.  Tolerating a regular diet.  Therapy evaluation completed and patient was admitted for a comprehensive rehab program..  Patient transferred to CIR on 12/13/2018 .   Patient currently requires min with mobility secondary to muscle weakness and decreased cardiorespiratoy endurance.  Prior to hospitalization, patient was independent  with mobility and lived with Other (Comment)(mother) in a Mobile home home.  Home access is 5Stairs to enter.  Patient will benefit from skilled PT intervention to maximize safe functional mobility, minimize fall risk and decrease caregiver burden for planned discharge home with 24 hour supervision.  Anticipate patient will benefit from follow up OP at discharge.  PT - End of Session Activity Tolerance: Tolerates < 10 min activity, no significant change in vital signs Endurance Deficit: Yes Endurance Deficit Description: Pt needing multiple rest breaks following w/c propulsion (following 150' and needed to sit and rest following 150' gait, unable to ambulate more during session due to fatigue). PT Assessment Rehab Potential  (ACUTE/IP ONLY): Good PT Barriers to Discharge: Medical stability;Inaccessible home environment PT Barriers to Discharge Comments: Question whether he will be able to ambulate/exercise due to decreased room in home. PT Patient demonstrates impairments in the following area(s): Balance;Endurance;Motor;Safety;Skin Integrity PT Transfers Functional Problem(s): Bed Mobility;Bed to Chair;Car;Furniture PT Locomotion Functional Problem(s): Ambulation;Stairs PT Plan PT Intensity: Minimum of 1-2 x/day ,45 to 90 minutes PT Frequency: 5 out of 7 days PT Duration Estimated Length of Stay: 7-9 days PT Treatment/Interventions: Ambulation/gait training;Balance/vestibular training;Community reintegration;Discharge planning;DME/adaptive equipment instruction;Functional mobility training;Neuromuscular re-education;Patient/family education;Skin care/wound management;Stair training;Therapeutic Activities;Therapeutic Exercise;UE/LE Strength taining/ROM;Wheelchair propulsion/positioning(w/c propulsion for therex) PT Transfers Anticipated Outcome(s): mod I with AD PT Locomotion Anticipated Outcome(s): mod I with AD PT Recommendation Follow Up Recommendations: Outpatient PT Patient destination: Home Equipment Recommended: Rolling walker with 5" wheels  Skilled Therapeutic Intervention  Evaluation completed as below.  Also provided education on proper use of RW to maintain posture and forward gaze and safe distance from RW during gait and turns.  Provided educated on ELOS and expected outcomes as well as follow up therapy.  Due to skin integrity issues, educated on pressure relief while in w/c and recliner to maintain blood flow to area and allow wound to heal.  Discussed that PT would assist with setting up HEP. Pt verbalized understanding of education.  Assisted back to room and left in recliner with alarm set and all needs in reach.   PT Evaluation Precautions/Restrictions Precautions Precautions:  Fall Restrictions Weight Bearing Restrictions: No General Chart Reviewed: Yes Vital Signs Pain Pain Assessment Pain Scale: 0-10 Pain Score: 0-No pain Home Living/Prior Functioning Home Living Available Help at Discharge: Family;Available 24 hours/day Type of Home: Mobile home Home Access: Stairs to enter Entrance Stairs-Number of Steps: 5 Entrance Stairs-Rails: Right;Left;Can reach both Home Layout: One level Bathroom Shower/Tub: Product/process development scientist: Standard Bathroom Accessibility: Yes Additional Comments: Mother will be living with patient upon D/C  Lives With: Other (Comment)(mother) Prior Function Level of Independence: Independent with basic ADLs;Independent with gait  Able to Take Stairs?: Yes Driving: Yes Vocation: Full time employment Vocation Requirements: 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) Vision/Perception     Cognition Overall Cognitive Status: Within Functional Limits for tasks assessed Arousal/Alertness: Lethargic Orientation Level: Oriented X4 Attention: Selective Selective Attention: Appears intact Memory: Appears intact Awareness: Appears intact Problem Solving: Appears intact Safety/Judgment: Appears intact Sensation Sensation Light Touch: Appears Intact Hot/Cold: Appears Intact Coordination Gross Motor Movements are Fluid and Coordinated: Yes Fine Motor Movements are Fluid and Coordinated: Yes Heel Shin Test: [facility] Motor  Motor Motor: Other (comment) Motor - Skilled Clinical Observations: Generalized weakness  Mobility Bed Mobility Bed Mobility: Rolling Right;Rolling Left;Supine to Sit;Sit to Supine Rolling Right: Independent Rolling Left: Independent Supine to Sit: Independent Sit to Supine: Independent Transfers Transfers: Sit to Stand;Stand to Sit;Stand Pivot Transfers Sit to Stand: Contact Guard/Touching assist Stand to Sit: Contact Guard/Touching  assist Stand Pivot Transfers: Contact Guard/Touching assist Transfer (Assistive device): None(none and RW) Locomotion  Gait Ambulation: Yes Gait Assistance: Minimal Assistance - Patient > 75%(did short distance x 20' with AD, +2 safety/mod A) Gait Distance (Feet): 150 Feet(20' without AD) Assistive device: Rolling walker Gait Assistance Details: Verbal cues for technique;Verbal cues for precautions/safety;Verbal cues for gait pattern;Verbal cues for safe use of DME/AE Gait Assistance Details: Provided cues for upright posture and forward gaze, as well as correct positioning of RW esp when turning. Gait Gait: Yes Gait Pattern: Decreased stride length;Decreased hip/knee flexion - right;Decreased hip/knee flexion - left;Shuffle;Trunk flexed Gait velocity: decreased Stairs / Additional Locomotion Stairs: Yes Stairs Assistance: Minimal Assistance - Patient > 75% Stair Management Technique: Two rails;Step to pattern;Forwards Number of Stairs: 4 Height of Stairs: 6 Ramp: Minimal Assistance - Patient >75% Wheelchair Mobility Wheelchair Mobility: Yes Wheelchair Assistance: Chartered loss adjuster: Both upper extremities;Both lower extermities(intermittent use of BLEs) Wheelchair Parts Management: Needs assistance Distance: 200  Trunk/Postural Assessment  Cervical Assessment Cervical Assessment: Exceptions to [facility] Cervical AROM Overall Cervical AROM Comments: Forward head Thoracic Assessment Thoracic Assessment: Exceptions to [facility] Thoracic AROM Overall Thoracic AROM Comments: rounded shoulders Lumbar Assessment Lumbar Assessment: Exceptions to [facility] Lumbar AROM Overall Lumbar AROM Comments: posterior pelvic tilt Postural Control Postural Control: Within Functional Limits  Balance Balance Balance Assessed: Yes Static Sitting Balance Static Sitting - Level of Assistance: 7: Independent Dynamic Sitting Balance Dynamic Sitting - Level of Assistance: 5: Stand by  assistance Static Standing Balance Static Standing - Level of Assistance: 5: Stand by assistance Dynamic Standing Balance Dynamic Standing - Level of Assistance: 4: Min assist;3: Mod assist(depending on UE support) Extremity Assessment      RLE Assessment General Strength Comments: hip flex 3+/5, all others grossly 4/5 LLE Assessment General Strength Comments: hip flex 3+/5, all others grossly 4/5    Refer  to Care Plan for Long Term Goals  Recommendations for other services: None   Discharge Criteria: Patient will be discharged from PT if patient refuses treatment 3 consecutive times without medical reason, if treatment goals not met, if there is a change in medical status, if patient makes no progress towards goals or if patient is discharged from hospital.  The above assessment, treatment plan, treatment alternatives and goals were discussed and mutually agreed upon: by patient  Denice Bors 12/14/2018, 10:57 AM
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
## 2018-12-14 NOTE — Progress Notes (Signed)
Patient places self on CPAP.  RT added sterile water to water chamber and placed within reach of patient.  RT will continue to monitor.

## 2018-12-14 NOTE — Progress Notes (Signed)
Physical Therapy Session Note  Patient Details  Name: [name] MRN: [id number] Date of Birth: Jan 12, 1976  Today's Date: 12/14/2018 PT Individual Time: 1105-1200 PT Individual Time Calculation (min): 55 min   Short Term Goals: Week 1:     Skilled Therapeutic Interventions/Progress Updates:  Pt received sitting in recliner in room, agreeable to second session with this PT.  Pt agreeable to ambulate same distance as before x 150' with RW at [facility] A level with continued cues for upright posture, less pressure through UEs, shoulder depression and increasing BOS.  Note that RLE weaker especially with increased gait as R knee seemed to demonstrate hyperextension and hip weakness became more pronounced with Trendelenburg type pattern.  Pt assisted remainder of distance to therapy gym.  Session focused on supine and standing therex for proximal hip strengthening, core and quad strengthening.  See below for details.  Also performed marching forwards/backwards in // bars at end of session x 1 rep.  Pt very fatigued and needing seated rest break.  Agreeable to self propel w/c part of way back to room x 50' at S level for UE strengthening.  Assisted pt remainder of distance to room and transferred back to bed without AD at [facility] level.  Left on EOB with bed alarm set and all needs in reach.    Access Code: [email or web address]  URL: [email or web address]  Date: 12/14/2018  Prepared by: Cameron Sprang   Exercises Supine Active Straight Leg Raise - 10 reps - 1 sets - 2x daily - 7x weekly Sidelying Hip Abduction - 10 reps - 1 sets - 2x daily - 7x weekly Supine Bridge - 10 reps - 1 sets - 1x daily - 7x weekly Standing Hip Abduction with Counter Support - 10 reps - 1 sets - 1x daily - 7x weekly Standing Hip Flexion - 10 reps - 1 sets - 1x daily - 7x weekly Sit to Stand without Arm Support - 5 reps - 2 sets - 1x daily - 7x weekly Therapy Documentation Precautions:  Precautions Precautions:  Fall Restrictions Weight Bearing Restrictions: No   Therapy/Group: Individual Therapy  Denice Bors 12/14/2018, 11:42 AM

## 2018-12-14 NOTE — Progress Notes (Signed)
Patient arrives from 2W with patient belongings. Patient oriented to room, rehab process, rehab schedule. Patient resting comfortably in bed, call bell at side , and no complaints of pain.

## 2018-12-14 NOTE — Progress Notes (Signed)
Jamse Arn, MD    Jamse Arn, MD  Physician  Physical Medicine and Rehabilitation     PMR Pre-admission  Signed     Date of Service:  12/13/2018  2:04 PM        Related encounter: ED to Hosp-Admission (Discharged) from 11/30/2018 in [facility]             Signed                    Expand widget buttonCollapse widget button    Show:Clear all   ManualTemplateCopied  Added by:     Cristina Gong, RN  Jamse Arn, MD   Hover for detailscustomization button                                                                                                                                                                                                       PMR Admission Coordinator Pre-Admission Assessment     Patient: [name] is an 43 y.o., male  MRN: [id number]  DOB: [birth year]  Height: '6\' 2"'  (188 cm)  Weight: 135.5 kg     Insurance Information  HMO:   PPO: yes     PCP:      IPA:      80/20:      OTHER:   PRIMARY: [place]      Policy#: [id number]      Subscriber: pt  CM Name: Andee Poles      Phone#: [phone]     Fax#: [phone]  Pre-Cert#: [id number] for 7 days with f/u Dorthula Nettles  Phone [phone] fax [phone]    Employer: [employer]  Benefits:  Phone #: [phone]     Name:   Eff. Date: 06/09/2018     Deduct: $2000      Out of Pocket Max: $4000      Life Max: none  CIR: 80%      SNF: 80%  Outpatient: $25 to $50 co pay per visit     Co-Pay: 25 visits each discipline  Home Health: 80%      Co-Pay: 120 visits per year  DME: 80%     Co-Pay: 20%  Providers: in network   SECONDARY: none          Medicaid  Application Date:       Case Manager:   Disability Application Date:  Case Worker:      The "Data Collection Information Summary" for patients in Inpatient Rehabilitation Facilities with attached "Privacy Act [place] Records" was provided and verbally reviewed with: N/A     Emergency Contact Information           Contact Information            Name    Relation    Home    Work    Mobile         Trindade,Roy   Father   [place]   Mother   [phone]                        Current Medical History   Patient Admitting Diagnosis: debility     History of Present Illness: 43 year old right-handed male with history of end-stage renal disease with hemodialysis status post kidney transplant in May 2014 followed at [facility], poorly controlled hypertension, diabetes mellitus, diastolic congestive heart failure, morbid obesity with BMI of 40, obstructive sleep apnea on CPAP.   Presented 11/30/2018 with increasing shortness of breath including chest discomfort and cough after missing one outpt dialysis treatment for going out of town for the weekend. He did have episode of emesis that was non bloody.  Noted low-grade fever 100.4, blood pressure 214/122, oxygen saturation 72%, hemoglobin 8.1, creatinine 15.23, lactic acid 1.2, COVID screening negative, urine drug screen negative.  Chest x-ray showed areas of aspects of opacity thought to represent pneumonia in the right base and left upper lobes.  Patient did require intubation and self extubated 12/06/2018.  Oxygen saturations 92% on room air.  Patient initially with bouts of delirium did receive Haldol.  Hemodialysis ongoing as per renal services.  Hospital course bouts of sinus tachycardia resolved with low-dose beta-blocker.  Subcutaneous heparin for DVT prophylaxis.  Tolerating a regular diet.       Patient's medical  record from [facility]  has been reviewed by the rehabilitation admission coordinator and physician.     Past Medical History        Past Medical History:    Diagnosis   Date    .   Anemia            ESRD    .   CHF (congestive heart failure) ([place])        .   Diabetes mellitus without complication ([place])        .   ESRD on hemodialysis ([place])   06/07/2011        TTS [facility] [employer]. Started HD in 2006, got transplant in 2014 lasted until Dec 2019 then went back on HD.  Has L thigh AVG as of Jun 2020.  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     Marland Kitchen   GERD (gastroesophageal reflux disease)        .   Hypertension   111/29/2012        pt states he takes no HTN meds    .   Hypothyroidism, secondary        .   Morbid obesity ([place])        .   Paroxysmal atrial fibrillation (H[facility])        .   Sleep apnea   06/07/2011        Pt had sllep study done 2 weeks ago- Dr. Joellyn Quails.  Does not  have a CPAP at this time.         Family History    family history includes Diabetes in his father; Stroke in his father.     Prior Rehab/Hospitalizations  Has the patient had prior rehab or hospitalizations prior to admission? Yes     Has the patient had major surgery during 100 days prior to admission? No                 Current Medications    Current Facility-Administered Medications:   .  0.9 %  sodium chloride infusion, , Intravenous, Continuous, Mannam, Praveen, MD, Last Rate: 10 mL/hr at 12/08/18 0600  .  0.9 %  sodium chloride infusion, , Intravenous, PRN, Rigoberto Noel, MD, Last Rate: 10 mL/hr at 12/07/18 0938  .  acetaminophen (TYLENOL) tablet 650 mg, 650 mg, Oral, Q6H PRN, 650 mg at 12/05/18 2022 **OR** acetaminophen (TYLENOL) suppository 650 mg, 650 mg, Rectal, Q6H PRN, Smith, Rondell A, MD  .  albuterol (PROVENTIL) (2.5 MG/3ML) 0.083% nebulizer solution 2.5 mg, 2.5 mg, Nebulization,  Q4H PRN, Smith, Rondell A, MD  .  amLODipine (NORVASC) tablet 5 mg, 5 mg, Oral, Daily, Pahwani, Ravi, MD, 5 mg at 12/13/18 1010  .  B-complex with vitamin C tablet 1 tablet, 1 tablet, Per Tube, Daily, Bowser, Laurel Dimmer, NP, 1 tablet at 12/13/18 1010  .  bisacodyl (DULCOLAX) suppository 10 mg, 10 mg, Rectal, Daily PRN, Bowser, Laurel Dimmer, NP, 10 mg at 12/05/18 1808  .  Chlorhexidine Gluconate Cloth 2 % PADS 6 each, 6 each, Topical, Q0600, Edrick Oh, MD, 6 each at 12/13/18 [phone]  .  Chlorhexidine Gluconate Cloth 2 % PADS 6 each, 6 each, Topical, Q0600, Roney Jaffe, MD  .  collagenase (SANTYL) ointment, , Topical, Daily, Pahwani, Ravi, MD  .  Darbepoetin Alfa (ARANESP) injection 100 mcg, 100 mcg, Intravenous, Samuel Germany, MD, 100 mcg at 12/07/18 1341  .  feeding supplement (PRO-STAT SUGAR FREE 64) liquid 30 mL, 30 mL, Oral, BID, Hall, Carole N, DO, 30 mL at 12/13/18 1010  .  ferric gluconate (NULECIT) 125 mg in sodium chloride 0.9 % 100 mL IVPB, 125 mg, Intravenous, Q T,Th,Sa-HD, Edrick Oh, MD, Stopped at 12/11/18 1905  .  haloperidol lactate (HALDOL) injection 1-4 mg, 1-4 mg, Intravenous, Q3H PRN, Rigoberto Noel, MD, 3 mg at 12/06/18 1615  .  heparin injection 5,000 Units, 5,000 Units, Subcutaneous, Q8H, Minus Breeding, MD, 5,000 Units at 12/13/18 1325  .  insulin aspart (novoLOG) injection 0-9 Units, 0-9 Units, Subcutaneous, Q4H, Bowser, Laurel Dimmer, NP, 1 Units at 12/10/18 2016  .  labetalol (NORMODYNE) injection 10 mg, 10 mg, Intravenous, Q6H PRN, Kayleen Memos, DO, 10 mg at 12/09/18 1907  .  MEDLINE mouth rinse, 15 mL, Mouth Rinse, BID, Rigoberto Noel, MD, 15 mL at 12/12/18 0956  .  metoprolol tartrate (LOPRESSOR) tablet 25 mg, 25 mg, Oral, BID, Hall, Carole N, DO, 25 mg at 12/13/18 1010  .  ondansetron (ZOFRAN) tablet 4 mg, 4 mg, Oral, Q6H PRN **OR** ondansetron (ZOFRAN) injection 4 mg, 4 mg, Intravenous, Q6H PRN, Tamala Julian, Rondell A, MD, 4 mg at 12/04/18 2027  .   senna-docusate (Senokot-S) tablet 1 tablet, 1 tablet, Per Tube, Daily, Bowser, Laurel Dimmer, NP, 1 tablet at 12/13/18 1010  .  sucroferric oxyhydroxide (VELPHORO) chewable tablet 2,000 mg, 2,000 mg, Oral, TID WC, Edrick Oh, MD, 2,000 mg at 12/13/18 1325     Patients  Current Diet:       Diet Order                                       Diet Carb Modified Fluid consistency: Thin; Room service appropriate? No  Diet effective now                              Precautions / Restrictions  Precautions  Precautions: Fall  Precaution Comments: monitor vitals closely  Restrictions  Weight Bearing Restrictions: No      Has the patient had 2 or more falls or a fall with injury in the past year? No     Prior Activity Level  Community (5-7x/wk): independent; works night shift and drives self to outpt dialysis     Prior Functional Level  Self Care: Did the patient need help bathing, dressing, using the toilet or eating? Independent     Indoor Mobility: Did the patient need assistance with walking from room to room (with or without device)? Independent     Stairs: Did the patient need assistance with internal or external stairs (with or without device)? Independent     Functional Cognition: Did the patient need help planning regular tasks such as shopping or remembering to take medications? Independent     Home Assistive Devices / Equipment  Home Assistive Devices/Equipment: None     Prior Device Use: Indicate devices/aids used by the patient prior to current illness, exacerbation or injury? None of the above     Current Functional Level   Cognition      Overall Cognitive Status: Within Functional Limits for tasks assessed  Current Attention Level: Focused  Orientation Level: Oriented X4  Following Commands: Follows multi-step commands with increased time(followed ~10% of commands)  Safety/Judgement: Decreased awareness  of safety, Decreased awareness of deficits  General Comments: Pt with slowed response time but motivated and engaged throughout session.        Extremity Assessment  (includes Sensation/Coordination)      Upper Extremity Assessment: RUE deficits/detail  RUE Deficits / Details: decr shoulder flexion < 50 degrees, pain with wrist flexion/ extension, edema at digits. pt noted to have mittens in room from previous use   Lower Extremity Assessment: Defer to PT evaluation        ADLs      Overall ADL's : Needs assistance/impaired  Eating/Feeding: Minimal assistance, Sitting  Eating/Feeding Details (indicate cue type and reason): requires use of L hand to self feed. requires (A) to open containers and maintain grasp on containers  Grooming: Oral care, Sitting  Grooming Details (indicate cue type and reason): sitting eob unsupported with L lateral lean spilling and decr lip closure   Upper Body Bathing: Maximal assistance  Lower Body Bathing: Total assistance  Upper Body Dressing : Maximal assistance  Lower Body Dressing: Total assistance  Lower Body Dressing Details (indicate cue type and reason): pt does attempt to lift leg to help don socks  Toilet Transfer Details (indicate cue type and reason): completes bed pan use rolling tot he Right side. pt voiding appropraite iwth bed pan use  Toileting- Clothing Manipulation and Hygiene: Total assistance  General ADL Comments: pt declined need to perform ADL this session, agreeable to UE HEP/ROM as he has just returned to bed from sitting up in recliner  Mobility      Overal bed mobility: Needs Assistance  Bed Mobility: Supine to Sit  Rolling: Max assist  Supine to sit: Min guard, HOB elevated  Sit to supine: Min guard  General bed mobility comments: pt able to reposition himself in bed with supervision        Transfers      Overall transfer level: Needs assistance  Equipment used: Rolling  walker (2 wheeled)  Transfers: Sit to/from Stand  Sit to Stand: Min assist  Stand pivot transfers: Min assist, +2 safety/equipment  General transfer comment: Assist to bring hips up and for balance. Verbal cues for hand placement.         Ambulation / Gait / Stairs / Wheelchair Mobility      Ambulation/Gait  Ambulation/Gait assistance: Air cabin crew (Feet): 125 Feet  Assistive device: Rolling walker (2 wheeled)  Gait Pattern/deviations: Step-through pattern, Decreased step length - right, Decreased step length - left, Trunk flexed  General Gait Details: Assist for balance and support. Verbal cues to stand more erect and look up  Gait velocity: decr  Gait velocity interpretation: 1.31 - 2.62 ft/sec, indicative of limited community ambulator        Posture / Balance   Balance  Overall balance assessment: Needs assistance  Sitting-balance support: No upper extremity supported, Feet supported  Sitting balance-Leahy Scale: Fair  Standing balance support: Bilateral upper extremity supported  Standing balance-Leahy Scale: Poor  Standing balance comment: walker and min guard for static standing        Special needs/care consideration   BiPAP/CPAP  Yes pta  CPM  N/a  Continuous Drip IV  N/a  Dialysis outpt hemodialysis TTHSat [place]; drives self  Life Vest  N/a  Oxygen  N/a  Special Bed  N/a  Trach Size  N/a  Wound Vac n/a  Skin     Date of Service:  12/13/2018 11:06 AM                        Incomplete                 untitled imageuntitled image  Show:Clear all  ?Manual?Template?Copied     Added by:  ?Nadara Mode, RN     ?Hover for detailsuntitled image     [place] Nurse wound consult note  Reason for Consult: perineum   Wound type: moisture vs pressure  Pressure Injury POA: Yes/No/NA  Measurement: 11cm x 2cm x 0.1cm   Wound bed:90% yellow/soft grey/10% pink   Drainage  (amount, consistency, odor) minimal with slight odor, yellow  Peri wound:intact   Dressing procedure/placement/frequency:  Enzymatic debridement to the gluteal cleft wound, apply daily.   ABD pad deep into the cleft to absorb moisture.           [place] Nurse team will follow with you and see patient within 10 days for wound assessments.  Please notify [place] nurses of any acute changes in the wounds or any new areas of concern  [facility] MSN, RN,CWOCN, CNS, CWON-AP  [phone]                      Left thigh AV graft  Bowel mgmt:  Incontinent 7/5  Bladder mgmt:   Diabetic mgmt: n/a  Behavioral consideration  N/a  Chemo/radiation  N/a  Patient works night shift 5 pm until 6 am as [employer]  Living Arrangements: Alone   Lives With: Alone  Available Help at Discharge: Family, Available PRN/intermittently(parents in [place] may come to assist)  Type of Home: Mobile home  Home Layout: One level  Home Access: Stairs to enter  Entrance Stairs-Rails: Right, Left, Can reach both  Entrance Stairs-Number of Steps: 5  Bathroom Shower/Tub: Tub/shower unit, Artist: Standard  Bathroom Accessibility: Yes  How Accessible: Accessible via walker  [place]: No  Additional Comments: pt clarified PLO and home living situation 0n 7/6 clearly     Discharge Living Setting  Plans for Discharge Living Setting: Patient's home, Alone, Mobile Home  Type of Home at Discharge: Mobile home  Discharge Home Layout: One level  Discharge Home Access: Stairs to enter  Entrance Stairs-Rails: Right, Left, Can reach both  Entrance Stairs-Number of Steps: 5  Discharge Bathroom Shower/Tub: Tub/shower unit, Curtain  Discharge Bathroom Toilet: Standard  Discharge Bathroom Accessibility: Yes  How Accessible: Accessible via walker  Does the patient have any problems obtaining your  medications?: No     Social/Family/Support Systems  Patient Roles: (works fulltime nights)  Sport and exercise psychologist Information: parents in [place]  Anticipated Caregiver: parents prn only  Anticipated Ambulance person Information: see above  Ability/Limitations of Caregiver: none, but live out of town  Caregiver Availability: Intermittent  Discharge Plan Discussed with Primary Caregiver: No  Is Caregiver In Agreement with Plan?: No  Does Caregiver/Family have Issues with Lodging/Transportation while Pt is in Rehab?: No     Goals/Additional Needs  Patient/Family Goal for Rehab: Mod I with PT and OT  Expected length of stay: ELOS 7 to 10 days  Special Service Needs: ESRD on hemodialysis TTHSat in [place]; drives self  Pt/Family Agrees to Admission and willing to participate: Yes  Program Orientation Provided & Reviewed with Pt/Caregiver Including Roles  & Responsibilities: Yes     Decrease burden of Care through IP rehab admission: n/a     Possible need for SNF placement upon discharge: not anticipated     Patient Condition: I have reviewed medical records [facility] , spoken with  patient. I met with patient at the bedside for inpatient rehabilitation assessment.  Patient will benefit from ongoing PT and OT, can actively participate in 3 hours of therapy a day 5 days of the week, and can make measurable gains during the admission.  Patient will also benefit from the coordinated team approach during an Inpatient Acute Rehabilitation admission.  The patient will receive intensive therapy as well as Rehabilitation physician, nursing, social worker, and care management interventions.  Due to bladder management, bowel management, safety, skin/wound care, disease management, medication administration, pain management and patient education the patient requires 24 hour a day rehabilitation nursing.  The patient is currently min assist with mobility and basic ADLs.   Discharge setting and therapy post discharge at home with home health is anticipated.  Patient has agreed to participate in the Acute Inpatient Rehabilitation Program and will admit today.     Preadmission Screen Completed By:  Cleatrice Burke, 12/13/2018 4:29 PM  ______________________________________________________________________    Discussed status with Dr. Posey Pronto  on 12/13/2018 at  77 and received approval for admission today.     Admission Coordinator:  Cleatrice Burke, RN MSN time  1630 Date  12/13/2018      Assessment/Plan:  Diagnosis: debility     1.Does the need for close, 24 hr/day Medical supervision in concert with the patient's rehab needs make it unreasonable for this  patient to be served in a less intensive setting? Potentially    2.Co-Morbidities requiring supervision/potential complications: end-stage renal disease with hemodialysis status post kidney transplant in May 2014 followed at [facility], poorly controlled hypertension, diabetes mellitus, diastolic congestive heart failure, morbid obesity with BMI of 40, obstructive sleep apnea on CPAP   3.Due to bowel management, safety, disease management and patient education, does the patient require 24 hr/day rehab nursing? Potentially   4.Does the patient require coordinated care of a physician, rehab nurse, PT (1-2 hrs/day, 5 days/week) and OT (1-2 hrs/day, 5 days/week) to address physical and functional deficits in the context of the above medical diagnosis(es)? Potentially Addressing deficits in the following areas: balance, endurance, locomotion, strength, transferring, bathing, dressing, toileting and psychosocial support   5.Can the patient actively participate in an intensive therapy program of at least 3 hrs of therapy 5 days a week? Yes   6.The potential for patient to make measurable gains while on inpatient rehab is excellent   7.Anticipated functional outcomes upon discharge  from inpatients are: modified independent PT, modified independent OT, n/a SLP   8.Estimated rehab length of stay to reach the above functional goals is: 5-7 days.   9.Anticipated D/C setting: Home   10.Anticipated post D/C treatments: HH therapy and Home excercise program   11.Overall Rehab/Functional Prognosis: excellent      MD Signature:  Delice Lesch, MD, ABPMR               Revision History

## 2018-12-14 NOTE — Progress Notes (Addendum)
[facility] Progress Note   Subjective: Lying in bed, no complaints. Says he feels better.     Objective Vitals:   12/13/18 1855 12/14/18 0438 12/14/18 0439  BP: (!) 143/95  (!) 143/81  Pulse: 99  96  Resp:   17  Temp: 99 F (37.2 C)  98 F (36.7 C)  TempSrc: Oral    SpO2: 99%  97%  Weight:  (!) 138.6 kg   Height:  6\' 2"  (1.88 m)    Physical Exam General: Obese male in NAD Heart: HS distant 1/6 systolic M No JVD Lungs: bilateral breath sounds decreased in bases Abdomen: S, NT Extremities: trace BLE edema Dialysis Access: L thigh AVG + bruit   Additional Objective Labs: Basic Metabolic Panel: Recent Labs  Lab 12/09/18 1500 12/10/18 0835 12/10/18 0855 12/11/18 1605 12/12/18 1033  NA  --   --  137 135 133*  K  --   --  4.4 4.6 4.3  CL  --   --  99 95* 95*  CO2  --   --  22 23 25   GLUCOSE  --   --  87 118* 104*  BUN  --   --  85* 103* 79*  CREATININE  --   --  10.17* 12.09* 10.55*  CALCIUM  --   --  9.2 9.0 9.0  PHOS 7.4* 6.9*  --  8.4*  --    Liver Function Tests: Recent Labs  Lab 12/10/18 0855 12/11/18 1605 12/12/18 1033  AST 36  --  25  ALT 267*  --  151*  ALKPHOS 91  --  81  BILITOT 1.2  --  1.1  PROT 6.5  --  6.9  ALBUMIN 2.6* 2.4* 2.6*   No results for input(s): LIPASE, AMYLASE in the last 168 hours. CBC: Recent Labs  Lab 12/08/18 0313 12/09/18 1500 12/10/18 0855 12/13/18 0544  WBC 9.8 10.8* 13.7* 10.0  NEUTROABS  --   --  9.4* 6.4  HGB 8.2* 8.4* 8.1* 8.2*  HCT 26.6* 26.8* 25.4* 26.9*  MCV 89.9 87.6 86.4 87.9  PLT 179 230 231 327   Blood Culture    Component Value Date/Time   SDES TRACHEAL ASPIRATE 12/01/2018 0316   SPECREQUEST NONE 12/01/2018 0316   CULT  12/01/2018 0316    NO GROWTH 2 DAYS Performed at [facility], [place] [address] [place], [place] [address]    REPTSTATUS 12/03/2018 FINAL 12/01/2018 0316    Cardiac Enzymes: No results for input(s): CKTOTAL, CKMB, CKMBINDEX, TROPONINI in the last 168  hours. CBG: Recent Labs  Lab 12/13/18 1625 12/13/18 2138 12/14/18 0001 12/14/18 0435 12/14/18 0751  GLUCAP 95 96 82 84 110*   Iron Studies: No results for input(s): IRON, TIBC, TRANSFERRIN, FERRITIN in the last 72 hours. @lablastinr3 @ Studies/Results: No results found. Medications:  . amLODipine  5 mg Oral Daily  . B-complex with vitamin C  1 tablet Per Tube Daily  . collagenase   Topical Daily  . darbepoetin (ARANESP) injection - DIALYSIS  100 mcg Intravenous Q Tue-HD  . feeding supplement (PRO-STAT SUGAR FREE 64)  30 mL Oral BID  . heparin  5,000 Units Subcutaneous Q8H  . insulin aspart  0-9 Units Subcutaneous TID AC & HS  . metoprolol tartrate  25 mg Oral BID  . senna-docusate  1 tablet Per Tube Daily  . sucroferric oxyhydroxide  2,000 mg Oral TID WC   HD orders: [place] T,Th,S 4.5 hrs 180NRe 500/Autoflow 1.5 146 kg 2.0K/2.25  Ca L AVG -Heparin 11400 units IV TIW -Parsabiv 5 mg IV TIW (not on hospital formulary) -Hectorol 4 mcg IV TIW -Mircera 150 mcg IV q 2 weeks  Assessment/Plan: 1. Resolved ARDS/aspiration pneumonitis/ pulmonary edema: in setting of missed HD. Extubated 12/06/18.  2. ESRD -T,Th, S HD today on schedule. K+4.3 Usual heparin  3. Anemia - HGB 8.2 Due for ESA today. Dose increased 4. Secondary hyperparathyroidism -Hyperphosphatemia noted. Poor binder compliance? Not on renal diet-orders changed. C Ca 10.1 Cont holding VDRA.  5. HTN/volume - HTN better controlled. Drastically under EDW. Continue lowering volume as tolerated.  6. Nutrition - Albumin 2.6 Changed  Renal/Carb mod diet. Prostat, renal vits 7. DM- per primary 8. OSA-CPAP 9. Debility-on CIRC  [employer] NP-C 12/14/2018, 12:02 PM  [facility] [phone]  Pt seen, examined and agree w A/P as above.  Kelly Splinter  MD 12/14/2018, 2:38 PM

## 2018-12-14 NOTE — Progress Notes (Signed)
Inpatient Rehabilitation  Patient information reviewed and entered into eRehab system by Verlie Hellenbrand M. Marianela Mandrell, M.A., CCC/SLP, PPS Coordinator.  Information including medical coding, functional ability and quality indicators will be reviewed and updated through discharge.    

## 2018-12-15 ENCOUNTER — Inpatient Hospital Stay (HOSPITAL_COMMUNITY): Payer: 59

## 2018-12-15 ENCOUNTER — Inpatient Hospital Stay (HOSPITAL_COMMUNITY): Payer: 59 | Admitting: Physical Therapy

## 2018-12-15 DIAGNOSIS — R7309 Other abnormal glucose: Secondary | ICD-10-CM

## 2018-12-15 LAB — GLUCOSE, CAPILLARY
Glucose-Capillary: 82 mg/dL (ref 70–99)
Glucose-Capillary: 85 mg/dL (ref 70–99)
Glucose-Capillary: 81 mg/dL (ref 70–99)
Glucose-Capillary: 100 mg/dL — ABNORMAL HIGH (ref 70–99)

## 2018-12-15 MED ORDER — CHLORHEXIDINE GLUCONATE CLOTH 2 % EX PADS: 6.0000 | MEDICATED_PAD | Freq: Every day | CUTANEOUS | Status: DC

## 2018-12-15 NOTE — Progress Notes (Signed)
[place] PHYSICAL MEDICINE & REHABILITATION PROGRESS NOTE  Subjective/Complaints: Patient seen laying in bed this morning.  States he did not sleep well overnight, but he will not until he is in his own bed.  He has questions regarding need for IV access.  ROS: Denies CP, SOB, N/V/D  Objective: Vital Signs: Blood pressure (!) 150/78, pulse 99, temperature 99.2 F (37.3 C), temperature source Oral, resp. rate 19, height 6\' 2"  (1.88 m), weight (!) 137 kg, SpO2 94 %. No results found. Recent Labs    12/13/18 0544 12/14/18 1603  WBC 10.0 8.3  HGB 8.2* 7.7*  HCT 26.9* 24.5*  PLT 327 284   Recent Labs    12/14/18 1603  NA 130*  K 4.8  CL 91*  CO2 21*  GLUCOSE 100*  BUN 119*  CREATININE 14.95*  CALCIUM 9.0    Physical Exam: BP (!) 150/78 (BP Location: Left Arm)   Pulse 99   Temp 99.2 F (37.3 C) (Oral)   Resp 19   Ht 6\' 2"  (1.88 m)   Wt (!) 137 kg   SpO2 94%   BMI 38.78 kg/m  Constitutional: No distress . Vital signs reviewed. HENT: Normocephalic.  Atraumatic. Eyes: EOMI.  No discharge. Cardiovascular: No JVD. Respiratory: Normal effort. GI: Non-distended. Musc: No edema or tenderness in extremities Neurological: He is alert.  Patient is alert in no acute distress. Follow commands fair medical historian Motor: Grossly 4+/5 throughout, stable Skin: Skin is warm and dry.  Psychiatric: He has a normal mood and affect. His behavior is normal.    Assessment/Plan: 1. Functional deficits secondary to debility which require 3+ hours per day of interdisciplinary therapy in a comprehensive inpatient rehab setting.  Physiatrist is providing close team supervision and 24 hour management of active medical problems listed below.  Physiatrist and rehab team continue to assess barriers to discharge/monitor patient progress toward functional and medical goals  Care Tool:  Bathing    Body parts bathed by patient: Right arm, Left upper leg, Right lower leg, Left arm,  Chest, Abdomen, Left lower leg, Face, Front perineal area, Buttocks, Right upper leg         Bathing assist Assist Level: Minimal Assistance - Patient > 75%     Upper Body Dressing/Undressing Upper body dressing   What is the patient wearing?: Pull over shirt    Upper body assist Assist Level: Supervision/Verbal cueing    Lower Body Dressing/Undressing Lower body dressing      What is the patient wearing?: Pants, Underwear/pull up     Lower body assist Assist for lower body dressing: Minimal Assistance - Patient > 75%     Toileting Toileting Toileting Activity did not occur (Clothing management and hygiene only): N/A (no void or bm)  Toileting assist Assist for toileting: Contact Guard/Touching assist     Transfers Chair/bed transfer  Transfers assist     Chair/bed transfer assist level: Contact Guard/Touching assist     Locomotion Ambulation   Ambulation assist      Assist level: 2 helpers(For safety, min/mod A overall) Assistive device: Hand held assist Max distance: 20(without RW, 150' with RW)   Walk 10 feet activity   Assist     Assist level: 2 helpers(for safety, without RW) Assistive device: Hand held assist   Walk 50 feet activity   Assist Walk 50 feet with 2 turns activity did not occur: Safety/medical concerns         Walk 150 feet activity   Assist  Walk 150 feet activity did not occur: Safety/medical concerns         Walk 10 feet on uneven surface  activity   Assist     Assist level: Minimal Assistance - Patient > 75% Assistive device: Aeronautical engineer Will patient use wheelchair at discharge?: No Type of Wheelchair: Manual    Wheelchair assist level: Supervision/Verbal cueing Max wheelchair distance: 200    Wheelchair 50 feet with 2 turns activity    Assist        Assist Level: Supervision/Verbal cueing   Wheelchair 150 feet activity     Assist     Assist Level:  Supervision/Verbal cueing      Medical Problem List and Plan: 1.  Debility secondary to ARDS/pulmonary edema with acute metabolic encephalopathy  Continue CIR   DC IV access  2.  Antithrombotics: -DVT/anticoagulation: Subcutaneous heparin.  Monitor for any bleeding episodes             -antiplatelet therapy: N/A 3. Pain Management: Tylenol as needed 4. Mood: Provide emotional support             -antipsychotic agents: N/A 5. Neuropsych: This patient is capable of making decisions on his own behalf. 6. Skin/Wound Care: Santyl to gluteal cleft wound daily.  Routine skin checks 7. Fluids/Electrolytes/Nutrition: Routine in and outs.  8.    Essential hypertension:  Uncontrolled at baseline. Patient with history of medical noncompliance.    Lopressor 25 mg twice daily, Norvasc 5 mg daily.  Follow cardiology services as needed.   Slightly elevated on 7/8  Monitor with increased mobility 9.    ESRD.  Hemodialysis as directed 10.  Diabetes mellitus poor medical compliance.  SSI.  Check blood sugars before meals and at bedtime.  Diabetic teaching.   Slightly labile on 7/8  Monitor with increased mobility 11. Morbid obesity:  BMI 38.35.  Dietary follow-up.  Encourage weight loss. 12. OSA.  CPAP as directed. 13. Anemia of chronic disease  Hemoglobin 7.7 on 7/7  Continue to monitor  LOS: 2 days A FACE TO FACE EVALUATION WAS PERFORMED   Lorie Phenix 12/15/2018, 12:53 PM

## 2018-12-15 NOTE — Progress Notes (Signed)
Occupational Therapy Session Note  Patient Details  Name: [name] MRN: [id number] Date of Birth: [birth year]  Today's Date: 12/15/2018 OT Individual Time: 5188-4166 OT Individual Time Calculation (min): 60 min   Session 2: OT Individual Time: 1300-1415 OT Individual Time Calculation (min): 75 min    Short Term Goals: Week 1:  OT Short Term Goal 1 (Week 1): STG= LTG d/t ELOS  Skilled Therapeutic Interventions/Progress Updates:    Session 1: Pt received supine with no c/o pain, agreeable to therapy. Pt donned teds and socks EOB with set up assistance only. Pt completed 150 ft of functional mobility to therapy gym with close (S) using RW. Pt sat EOM and completed multiple sit <> stands with (S). Pt completed functional stepping activity with a focus on increasing functional activity tolerance. Pt completed brief single leg stance activity with several minor LOB, requiring min A to correct 3x10 trials. Pt required intermittent rest breaks throughout session. Pt then completed 3x5 weighted sit <> stands with no more than CGA but increased exertion and required extended rest break between each trial. Pt used agility ladder to improve BLE coordination and dynamic standing balance. (S) provided throughout as well as demonstration. Cueing for positioning provided. Pt returned to room and was left sitting up in the recliner with all needs met, chair alarm set.   Session 2: Pt received sitting up in recliner with no c/o pain. Pt completed ambulatory transfer to bathroom with (S) without any AD. Assistance required to remove bandage from buttocks for pt to have BM. Pt able to remain standing during dressing removal with no LOB. Pt completed toileting tasks with (S). He required cueing for doffing LB clothing seated. He transferred into walk in shower with TTB facing out 2/2 body habitus. Pt washed all UB and LB with (S). Pt required x2 attempts to power up into standing from TTB but was able to do so  without assistance. Pt transferred out of shower and to EOB with (S). He donned shirt and pants with (S). Discussed wound on gluteal cleft with pt to ensure carryover of seriousness and need for follow up. Also discussed OPOT recommendation for [facility]. Pt then completed 250 ft of functional mobility to ADL apartment. Pt required increased rest break following. Pt completed bathtub transfer to simulate preferred method of bathing. Pt required mod A to push up out of tub. Pt described experience as "eye opening" to his deficits and how much more "work he has to do". Encouragement provided as well as honest conversation re deficits. Pt returned to his room. A roho cushion was placed on recliner to increase pressure relief from gluteal cleft wound. Pt left sitting up with RN present.    Therapy Documentation Precautions:  Precautions Precautions: Fall Restrictions Weight Bearing Restrictions: No   Therapy/Group: Individual Therapy  [name] 12/15/2018, 7:11 AM

## 2018-12-15 NOTE — Progress Notes (Signed)
Patient places self on CPAP machine.  RT placed CPAP within reach of patient and checked water chamber.  RT will continue to monitor.

## 2018-12-15 NOTE — Progress Notes (Signed)
Physical Therapy Session Note  Patient Details  Name: [name] MRN: [id number] Date of Birth: 23-Jan-1976  Today's Date: 12/15/2018 PT Individual Time: 1600-1700 PT Individual Time Calculation (min): 60 min   Short Term Goals: Week 1:  PT Short Term Goal 1 (Week 1): STGs=LTGs due to ELOS  Skilled Therapeutic Interventions/Progress Updates:   Pt received supine in bed and agreeable to PT. Supine>sit transfer without assist or cues. Stand pivot transfer to and from [facility] with RW and supervision assist throughout treatment. Transported to rehab gym in [facility].   Gait training with RW x 183f +20102fand supervision assist for safety in turns. Gait without AD x 20034fith CGA for safety. Dynamic gait training with AD to weave through 8 cones x 2 with CGA. Pt also instructed in dynamic gait through 6 cones and over 2 canes with CGA and no AD. Mild R hip instability noted with increasing trendelenburg gait pattern with fatigue.   Dynamic balance training to perform corn hall toss while standing on airex pad. X 8 Bil with CGA for safety. Pt then picked bean bags off ground with CGA for safety.    Nustep reciprocal endurance training x 8 min level 7>5. Multiple short and prolonged therapeutic rest breaks due to BLE and BUE fatigue. Cues from PT for proper speed to improve strengthening aspect of movements.   [facility] returned to room and [facility] and all needs met.          Therapy Documentation Precautions:  Precautions Precautions: Fall Restrictions Weight Bearing Restrictions: No Vital Signs: Therapy Vitals Temp: 98.7 F (37.1 C) Temp Source: Oral Pulse Rate: 93 Resp: 17 BP: (!) 160/90 Patient Position (if appropriate): Sitting Oxygen Therapy SpO2: 100 % O2 Device: Room Air Pain: denies   Therapy/Group: Individual Therapy  AusLorie Phenix8/2020, 5:05 PM

## 2018-12-16 ENCOUNTER — Inpatient Hospital Stay (HOSPITAL_COMMUNITY): Payer: 59 | Admitting: Rehabilitation

## 2018-12-16 ENCOUNTER — Inpatient Hospital Stay (HOSPITAL_COMMUNITY): Payer: 59

## 2018-12-16 DIAGNOSIS — J811 Chronic pulmonary edema: Secondary | ICD-10-CM

## 2018-12-16 DIAGNOSIS — G9341 Metabolic encephalopathy: Secondary | ICD-10-CM

## 2018-12-16 DIAGNOSIS — E119 Type 2 diabetes mellitus without complications: Secondary | ICD-10-CM

## 2018-12-16 DIAGNOSIS — J80 Acute respiratory distress syndrome: Secondary | ICD-10-CM

## 2018-12-16 LAB — RENAL FUNCTION PANEL
Albumin: 2.7 g/dL — ABNORMAL LOW (ref 3.5–5.0)
Anion gap: 18 — ABNORMAL HIGH (ref 5–15)
BUN: 82 mg/dL — ABNORMAL HIGH (ref 6–20)
CO2: 22 mmol/L (ref 22–32)
Calcium: 9 mg/dL (ref 8.9–10.3)
Chloride: 91 mmol/L — ABNORMAL LOW (ref 98–111)
Creatinine, Ser: 13.08 mg/dL — ABNORMAL HIGH (ref 0.61–1.24)
GFR calc Af Amer: 5 mL/min — ABNORMAL LOW (ref >60)
GFR calc non Af Amer: 4 mL/min — ABNORMAL LOW (ref >60)
Glucose, Bld: 106 mg/dL — ABNORMAL HIGH (ref 70–99)
Phosphorus: 8.7 mg/dL — ABNORMAL HIGH (ref 2.5–4.6)
Potassium: 4.6 mmol/L (ref 3.5–5.1)
Sodium: 131 mmol/L — ABNORMAL LOW (ref 135–145)

## 2018-12-16 LAB — CBC
HCT: 23.7 % — ABNORMAL LOW (ref 39.0–52.0)
Hemoglobin: 7.4 g/dL — ABNORMAL LOW (ref 13.0–17.0)
MCH: 27.3 pg (ref 26.0–34.0)
MCHC: 31.2 g/dL (ref 30.0–36.0)
MCV: 87.5 fL (ref 80.0–100.0)
Platelets: 256 10*3/uL (ref 150–400)
RBC: 2.71 MIL/uL — ABNORMAL LOW (ref 4.22–5.81)
RDW: 18.6 % — ABNORMAL HIGH (ref 11.5–15.5)
WBC: 6.5 10*3/uL (ref 4.0–10.5)
nRBC: 0 % (ref 0.0–0.2)

## 2018-12-16 LAB — GLUCOSE, CAPILLARY
Glucose-Capillary: 79 mg/dL (ref 70–99)
Glucose-Capillary: 80 mg/dL (ref 70–99)
Glucose-Capillary: 73 mg/dL (ref 70–99)
Glucose-Capillary: 105 mg/dL — ABNORMAL HIGH (ref 70–99)

## 2018-12-16 MED ORDER — SENNOSIDES-DOCUSATE SODIUM 8.6-50 MG PO TABS: 1.0000 | ORAL_TABLET | Freq: Every day | ORAL | Status: DC

## 2018-12-16 MED ORDER — LIDOCAINE-PRILOCAINE 2.5-2.5 % EX CREA: 1.0000 "application " | TOPICAL_CREAM | CUTANEOUS | Status: DC | PRN

## 2018-12-16 MED ORDER — METOPROLOL TARTRATE 25 MG PO TABS: 25.0000 mg | ORAL_TABLET | Freq: Two times a day (BID) | ORAL | 0 refills | Status: DC

## 2018-12-16 MED ORDER — ACETAMINOPHEN 325 MG PO TABS: 650.0000 mg | ORAL_TABLET | Freq: Four times a day (QID) | ORAL | Status: DC | PRN

## 2018-12-16 MED ORDER — PENTAFLUOROPROP-TETRAFLUOROETH EX AERO: 1.0000 "application " | INHALATION_SPRAY | CUTANEOUS | Status: DC | PRN

## 2018-12-16 MED ORDER — DARBEPOETIN ALFA 200 MCG/0.4ML IJ SOSY
PREFILLED_SYRINGE | INTRAMUSCULAR | Status: AC
  Administered 2018-12-16: 18:00:00 200 ug via INTRAVENOUS
  Filled 2018-12-16: qty 0.4

## 2018-12-16 MED ORDER — DARBEPOETIN ALFA 200 MCG/0.4ML IJ SOSY
200.0000 ug | PREFILLED_SYRINGE | INTRAMUSCULAR | Status: DC
  Administered 2018-12-16: 18:00:00 200 ug via INTRAVENOUS

## 2018-12-16 MED ORDER — AMLODIPINE BESYLATE 5 MG PO TABS: 5.0000 mg | ORAL_TABLET | Freq: Every day | ORAL | 0 refills | Status: DC

## 2018-12-16 MED ORDER — DARBEPOETIN ALFA 200 MCG/0.4ML IJ SOSY: 200.0000 ug | PREFILLED_SYRINGE | INTRAMUSCULAR | Status: DC

## 2018-12-16 MED ORDER — SODIUM CHLORIDE 0.9 % IV SOLN: 100.0000 mL | INTRAVENOUS | Status: DC | PRN

## 2018-12-16 MED ORDER — HEPARIN SODIUM (PORCINE) 1000 UNIT/ML DIALYSIS
11400.0000 [IU] | Freq: Once | INTRAMUSCULAR | Status: AC
  Administered 2018-12-16: 11400 [IU] via INTRAVENOUS_CENTRAL
  Filled 2018-12-16: qty 12

## 2018-12-16 MED ORDER — LIDOCAINE HCL (PF) 1 % IJ SOLN
5.0000 mL | INTRAMUSCULAR | Status: DC | PRN
  Filled 2018-12-16: qty 5

## 2018-12-16 MED ORDER — VELPHORO 500 MG PO CHEW: 2000.0000 mg | CHEWABLE_TABLET | Freq: Three times a day (TID) | ORAL | 0 refills | Status: DC

## 2018-12-16 MED ORDER — HEPARIN SODIUM (PORCINE) 1000 UNIT/ML IJ SOLN
INTRAMUSCULAR | Status: AC
  Filled 2018-12-16: qty 12

## 2018-12-16 NOTE — Progress Notes (Signed)
Physical Therapy Session Note  Patient Details  Name: [name] MRN: [id number] Date of Birth: [birth year]  Today's Date: 12/16/2018 PT Individual Time: 0935-1030 PT Individual Time Calculation (min): 55 min   Short Term Goals: Week 1:  PT Short Term Goal 1 (Week 1): STGs=LTGs due to ELOS  Skilled Therapeutic Interventions/Progress Updates:   Pt received sitting in recliner in room, agreeable to therapy.  Ambulated x 100' with RW at S level to day room.  Performed seated nustep x 6 mins at level 5 without stopping using BUEs/LEs with cues to maintain rpms in 70's throughout.  No SOB noted throughout.  Ambulated to main gym as above x 85' with improvement noted in posture and heel to toe strike.  Once in therapy gym, performed series of high level balance exercises to challenge multi-sensory systems as well as ankle, hip and stepping strategy; step up/downs to aerobic step x 15 reps each direction then side to side x 15 reps all without stopping, standing on rocker board biased horizontally maintaining balance x 2 sets of 30 secs, biased vertically maintaining balance x 2 sets of 30 secs progressing to moving from posterior weight shift to anterior weight shift without UE support for hip strategy.  Pt needing intermittent support, but progressed to being able to do at S level.  Standing on foam airex performing heel taps forward x 20 reps, toe taps forward x 20 reps with intermittent UE support as needed.  Remainder of session focused on gait without device and initiating gait training with use of SPC.  Ambulated without AD x 150' with CGA and min cues for posture and improving heel strike and stride length.  Once in ortho gym, PT retrieved [facility] and had pt use initially in R hand (pt right handed) x 100', however continue to note overt R Trendelenburg, therefore had pt place cane in L hand with noted improvement.  Pt ambulated x 30' then another 150' back to room with [facility] to S.  Pt left with  all needs in reach and chair alarm set.   Therapy Documentation Precautions:  Precautions Precautions: Fall Restrictions Weight Bearing Restrictions: No   Pain: Pain Assessment Pain Scale: 0-10 Pain Score: 0-No pain    Therapy/Group: Individual Therapy  Denice Bors 12/16/2018, 9:49 AM

## 2018-12-16 NOTE — Progress Notes (Addendum)
[facility] Progress Note   Subjective: On HD watching movie on phone. No C/Os.   Objective Vitals:   12/16/18 1400 12/16/18 1430 12/16/18 1500 12/16/18 1530  BP: (!) 149/90 (!) 149/95 (!) 163/90 (!) 166/82  Pulse: 92 92 98 (!) 101  Resp: 16 16 18 18   Temp:      TempSrc:      SpO2:      Weight:      Height:           Physical Exam General: Obese male in NAD Heart: HS distant 1/6 systolic M No JVD Lungs: bilateral breath sounds decreased in bases Abdomen: S, NT Extremities: trace BLE edema Dialysis Access: L thigh AVG blood lines connected   Additional Objective Labs: Basic Metabolic Panel: Recent Labs  Lab 12/11/18 1605 12/12/18 1033 12/14/18 1603 12/16/18 1350  NA 135 133* 130* 131*  K 4.6 4.3 4.8 4.6  CL 95* 95* 91* 91*  CO2 23 25 21* 22  GLUCOSE 118* 104* 100* 106*  BUN 103* 79* 119* 82*  CREATININE 12.09* 10.55* 14.95* 13.08*  CALCIUM 9.0 9.0 9.0 9.0  PHOS 8.4*  --  10.3* 8.7*   Liver Function Tests: Recent Labs  Lab 12/10/18 0855  12/12/18 1033 12/14/18 1603 12/16/18 1350  AST 36  --  25  --   --   ALT 267*  --  151*  --   --   ALKPHOS 91  --  81  --   --   BILITOT 1.2  --  1.1  --   --   PROT 6.5  --  6.9  --   --   ALBUMIN 2.6*   < > 2.6* 2.5* 2.7*   < > = values in this interval not displayed.   No results for input(s): LIPASE, AMYLASE in the last 168 hours. CBC: Recent Labs  Lab 12/10/18 0855 12/13/18 0544 12/14/18 1603 12/16/18 1350  WBC 13.7* 10.0 8.3 6.5  NEUTROABS 9.4* 6.4  --   --   HGB 8.1* 8.2* 7.7* 7.4*  HCT 25.4* 26.9* 24.5* 23.7*  MCV 86.4 87.9 86.9 87.5  PLT 231 327 284 256   Blood Culture    Component Value Date/Time   SDES TRACHEAL ASPIRATE 12/01/2018 0316   SPECREQUEST NONE 12/01/2018 0316   CULT  12/01/2018 0316    NO GROWTH 2 DAYS Performed at [facility], [place] [address], [place], [place] [address]    REPTSTATUS 12/03/2018 FINAL 12/01/2018 0316    Cardiac Enzymes: No results for  input(s): CKTOTAL, CKMB, CKMBINDEX, TROPONINI in the last 168 hours. CBG: Recent Labs  Lab 12/15/18 1137 12/15/18 1713 12/15/18 2159 12/16/18 0625 12/16/18 1111  GLUCAP 85 81 100* 79 80   Iron Studies: No results for input(s): IRON, TIBC, TRANSFERRIN, FERRITIN in the last 72 hours. @lablastinr3 @ Studies/Results: No results found. Medications: . sodium chloride    . sodium chloride     . amLODipine  5 mg Oral Daily  . Chlorhexidine Gluconate Cloth  6 each Topical Q0600  . collagenase   Topical Daily  . [START ON 12/21/2018] darbepoetin (ARANESP) injection - DIALYSIS  150 mcg Intravenous Q Tue-HD  . feeding supplement (PRO-STAT SUGAR FREE 64)  30 mL Oral BID  . heparin  11,400 Units Dialysis Once in dialysis  . heparin  5,000 Units Subcutaneous Q8H  . insulin aspart  0-9 Units Subcutaneous TID AC & HS  . metoprolol tartrate  25 mg Oral BID  .  senna-docusate  1 tablet Per Tube Daily  . sucroferric oxyhydroxide  2,000 mg Oral TID WC     HD orders: [place] T,Th,S 4.5 hrs 180NRe 500/Autoflow 1.5 146 kg 2.0K/2.25 Ca L AVG -Heparin 11400 units IV TIW -Parsabiv 5 mg IV TIW (not on hospital formulary) -Hectorol 4 mcg IV TIW -Mircera 150 mcg IV q 2 weeks  Assessment/Plan: 1. Resolved ARDS/aspiration pneumonitis/ pulmonary edema: in setting of missed HD. Extubated 12/06/18.  2. ESRD -T,Th, S HD today on schedule. K+4.6. Usual heparin. SCr elevated 13.08-14.95 BUN 82-119. Azotemic so will ^inpt HD to 4h. Discussed with HD staff.  3. Anemia - HGB 7.4. Steadily declining. Check FOBT. Was supposed to get ESA 12/14/18 however when order changed, it changed date to be given to next week. Give Aranesp 200 mcg IV today.  4. Secondary hyperparathyroidism -Hyperphosphatemia noted.  Phos 8.7 today. Poor binder compliance? Not on renal diet-orders changed. C Ca 10.0 Cont holding VDRA.  5. HTN/volume - HTN better controlled. Drastically under EDW. Continue lowering volume as tolerated. Pre wt 135.9  UFG 3.0 liters.  6. Nutrition - Albumin 2.6 Changed  Renal/Carb mod diet. Prostat, renal vits 7. DM- per primary 8. OSA-CPAP 9. Debility-on CIRC  [employer] Brown NP-C 12/16/2018, 3:55 PM  [facility] [phone]  Pt seen, examined and agree w A/P as above.  Robert Splinter  MD 12/17/2018, 8:02 AM

## 2018-12-16 NOTE — Progress Notes (Signed)
Occupational Therapy Session Note  Patient Details  Name: [name] MRN: [id number] Date of Birth: 06/03/1976  Today's Date: 12/16/2018 OT Individual Time: 0700-0815 OT Individual Time Calculation (min): 75 min    Short Term Goals: Week 1:  OT Short Term Goal 1 (Week 1): STG= LTG d/t ELOS  Skilled Therapeutic Interventions/Progress Updates:    1;1. Pt received in bed with no c/o pain. Pt requesting to eat breakfast quickly prior to dressing. Pt and OT disuss CLOF tub transfers and current deficits. Pt dresses at sit to stand level from EOB with S. Pt grooms with S standing at sink. Pt declines bathing this session. Pt ambulates to tx gym with S and VC for standing tall/not putting as much weight into UEs on RW. Pt completes shoulder flex/ext, horizontal ab/adduct, and ab/adduct towel glides on table 2x10 for BUE strengthening with 2# wrist weights and lengthening of muscles as well as standing tolerance during activity with supervision. Pt completes 2x10 bicep curl and scapular elevation. 2x5 scapular push ups and regular push ups agains high low table at highest setting. Pt provided with handout with all exercises to complete at home. Exited session with pt seated in recliner call light inreach, exit alarm on and all needs met  Therapy Documentation Precautions:  Precautions Precautions: Fall Restrictions Weight Bearing Restrictions: No General:   Vital Signs: Therapy Vitals Temp: 97.8 F (36.6 C) Temp Source: Oral Pulse Rate: 87 Resp: 18 BP: (!) 151/90 Patient Position (if appropriate): Lying Oxygen Therapy SpO2: 99 % O2 Device: Room Air Pain:   ADL: ADL Eating: Independent Where Assessed-Eating: Edge of bed Grooming: Supervision/safety Where Assessed-Grooming: Standing at sink Upper Body Bathing: Supervision/safety Where Assessed-Upper Body Bathing: Shower Lower Body Bathing: Contact guard Where Assessed-Lower Body Bathing: Shower Upper Body Dressing:  Supervision/safety Where Assessed-Upper Body Dressing: Edge of bed Lower Body Dressing: Contact guard Where Assessed-Lower Body Dressing: Edge of bed Toileting: Contact guard Where Assessed-Toileting: Toilet Toilet Transfer: Contact guard Toilet Transfer Method: Ambulating Walk-In Shower Transfer: Contact guard, Minimal cueing Walk-In Shower Transfer Method: Ambulating Walk-In Shower Equipment: Transfer tub bench Vision   Perception    Praxis   Exercises:   Other Treatments:     Therapy/Group: Individual Therapy   M  12/16/2018, 7:07 AM 

## 2018-12-16 NOTE — Progress Notes (Signed)
Social Work Assessment and Plan   Patient Details  Name: GUERIN LASHOMB MRN: [id number] Date of Birth: Mar 28, 1976  Today's Date: 12/15/2018  Problem List:  Patient Active Problem List   Diagnosis Date Noted  . Labile blood glucose   . Anemia of chronic disease   . Debility 12/13/2018  . Diabetes mellitus type 2 in obese ([place])   . Medically noncompliant   . Pressure injury of skin 12/12/2018  . Sepsis ([place]) 11/30/2018  . Acute respiratory failure with hypoxia ([place]) 11/30/2018  . Acute encephalopathy   . Septic shock ([place])   . Hematuria 11/03/2018  . Genital warts 11/03/2018  . Meralgia paresthetica of right side 08/13/2018  . ESRD on dialysis ([place]) 06/04/2018  . Acute kidney injury superimposed on CKD ([place]) 05/10/2018  . Acidosis 05/10/2018  . Anemia 05/10/2018  . Acute renal failure superimposed on stage 5 chronic kidney disease, not on chronic dialysis ([place])   . Type 2 diabetes mellitus without complication, without long-term current use of insulin ([place]) 01/25/2018  . Essential hypertension 01/25/2017  . Well adult exam 01/17/2016  . Morbid obesity ([place]) 01/17/2016  . Delayed graft function of kidney transplant due to reason other than ATN or rejection requiring acute dialysis ([place]) 11/02/2012  . Immunosuppression ([place]) 11/02/2012  . Kidney replaced by transplant 11/02/2012  . OSA (obstructive sleep apnea) 05/26/2011   Past Medical History:  Past Medical History:  Diagnosis Date  . Anemia    ESRD  . CHF (congestive heart failure) ([place])   . Diabetes mellitus without complication ([place])   . ESRD on hemodialysis ([place]) 06/07/2011   TTS [employer]. Started HD in 2006, got transplant in 2014 lasted until Dec 2019 then went back on HD.  Has L thigh AVG as of Jun 2020.  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   Marland Kitchen GERD (gastroesophageal reflux disease)   . Hypertension 111/29/2012   pt states he takes no HTN meds  . Hypothyroidism, secondary   . Morbid obesity  ([place])   . Paroxysmal atrial fibrillation ([facility])   . Sleep apnea 06/07/2011   Pt had sllep study done 2 weeks ago- Dr. Joellyn Quails.  Does not  have a CPAP at this time.   Past Surgical History:  Past Surgical History:  Procedure Laterality Date  . ARTERIOVENOUS GRAFT PLACEMENT  08/09/10   Left Thigh Graft by Dr. [name]  . AV FISTULA PLACEMENT Right 05/18/2018   Procedure: INSERTION OF ARTERIOVENOUS (AV) GORE-TEX GRAFT Left THIGH;  Surgeon: Waynetta Sandy, MD;  Location: [facility];  Service: Vascular;  Laterality: Right;  . CAPD REMOVAL  05/08/2011   Procedure: CONTINUOUS AMBULATORY PERITONEAL DIALYSIS  (CAPD) CATHETER REMOVAL;  Surgeon: [name], MD;  Location: [facility];  Service: General;  Laterality: N/A;  Removal of CAPD catheter, Dr. requests to go after 100  . INSERTION OF DIALYSIS CATHETER  10/05/10   Right Femoral Cath insertion by Dr. [name].  Pt ahas had several caths inserted.  . IR FLUORO GUIDE CV LINE RIGHT  05/11/2018  . IR US GUIDE VASC ACCESS RIGHT  05/11/2018  . KIDNEY TRANSPLANT  2014  . UPPER EXTREMITY ANGIOGRAPHY Bilateral 05/13/2018   Procedure: UPPER EXTREMITY ANGIOGRAPHY - bilarteral;  Surgeon: Marty Heck, MD;  Location: [facility];  Service: Cardiovascular;  Laterality: Bilateral;   Social History:  reports that he has never smoked. He has never used smokeless tobacco. He reports current drug use. Frequency: 5.00 times  per week. Drug: Marijuana. He reports that he does not drink alcohol.  Family / Support Systems Marital Status: Single Patient Roles: (son) Other Supports: parents Saul Fordyce):  [place] @ [phone] and Genia Hotter @ [phone] Anticipated Caregiver: parents prn only Ability/Limitations of Caregiver: none, but live out of town Caregiver Availability: Intermittent Family Dynamics: Pt notes good support and "they will help if I need it"  Social History Preferred language: English Religion:  Christian Cultural Background: NA Read: Yes Write: Yes Employment Status: Employed Name of Employer: [employer] Return to Work Plans: when he is medically cleared to do so Public relations account executive Issues: None Guardian/Conservator: None - per MD, pt is capable of making decisions on his own behalf.   Abuse/Neglect Abuse/Neglect Assessment Can Be Completed: Yes Physical Abuse: Denies Verbal Abuse: Denies Sexual Abuse: Denies Exploitation of patient/patient's resources: Denies Self-Neglect: Denies  Emotional Status Pt's affect, behavior and adjustment status: Pt sitting up in w/c, very pleasant and completes assessment interview without difficulty.  He denies any significant emotional distress - will monitor. Recent Psychosocial Issues: None Psychiatric History: None Substance Abuse History: None  Patient / Family Perceptions, Expectations & Goals Pt/Family understanding of illness & functional limitations: Pt with good understanding of his level of debility and need for CIR. Premorbid pt/family roles/activities: completely independent and working f/t Anticipated changes in roles/activities/participation: little change if able to reach independent goals. Pt/family expectations/goals: to regain prior independent level of fxn  [employer]: Other (Comment)(HD) Premorbid Home Care/DME Agencies: None Transportation available at discharge: yes  Discharge Planning Living Arrangements: Alone Support Systems: Parent Type of Residence: Private residence Insurance Resources: Multimedia programmer (specify)(UHC) Financial Resources: Employment Financial Screen Referred: No Living Expenses: Education officer, community Management: Patient Does the patient have any problems obtaining your medications?: No Home Management: pt Patient/Family Preliminary Plans: Pt to return home alone to his trailer Social Work Anticipated Follow Up Needs: HH/OP Expected length of stay: ELOS 7 to 10  days  Clinical Impression Pleasant gentleman here for debility following pna with resp failure.  Making good progress toward mod ind goals and plan is to return home alone.  Will follow for d/c planning needs.  Dezmen Alcock 12/15/2018, 4:14 PM

## 2018-12-16 NOTE — Progress Notes (Signed)
Physical Therapy Session Note  Patient Details  Name: [name] MRN: [id number] Date of Birth: 1975/06/26  Today's Date: 12/16/2018 PT Individual Time: 1118-1200 PT Individual Time Calculation (min): 42 min   Short Term Goals: Week 1:  PT Short Term Goal 1 (Week 1): STGs=LTGs due to ELOS  Skilled Therapeutic Interventions/Progress Updates:   Pt received sitting in recliner, agreeable to second PT session.  Pt ambulated to/from therapy gym with SPC in L hand at close S to CGA level with cues for posture, heel strike and increase stride length.  Once in therapy gym, focused on going over HEP.  Performed all standing exercises as below with cues for decreasing UE support to increase challenge and performing slower for increased muscle contraction and also to increase balance challenge.  Discussed that he could also perform exercises at home on pillow to further increase balance challenge, but did not perform during session.  Also performed supine exercises as below with increased challenge with bridging by elevating leg while in bridge.  Pt making excellent progress and feel that he can D/C tomorrow after therapies.  OT agrees, Education officer, museum made aware and MD notified.      Access Code: [email or web address]  URL: [email or web address]  Date: 12/16/2018  Prepared by: Cameron Sprang   Exercises Supine Active Straight Leg Raise - 10 reps - 1 sets - 2x daily - 7x weekly Sidelying Hip Abduction - 10 reps - 1 sets - 2x daily - 7x weekly Standing Hip Abduction with Counter Support - 10 reps - 1 sets - 1x daily - 7x weekly Standing Hip Flexion - 10 reps - 1 sets - 1x daily - 7x weekly Single Leg Bridge - 5 reps - 2 sets - 1x daily - 7x weekly Sit to Stand without Arm Support - 5 reps - 2 sets - 1x daily - 7x weekly  Therapy Documentation Precautions:  Precautions Precautions: Fall Restrictions Weight Bearing Restrictions: No   Pain: Pain Assessment Pain Scale: 0-10 Pain Score:  0-No pain    Therapy/Group: Individual Therapy  Denice Bors 12/16/2018, 11:49 AM

## 2018-12-16 NOTE — Discharge Summary (Signed)
Physician Discharge Summary  Patient ID: [name] MRN: [id number] DOB/AGE: April 27, 1976 43 y.o.  Admit date: 12/13/2018 Discharge date: 12/17/2018  Discharge Diagnoses:  Active Problems:   Debility   Anemia of chronic disease   Labile blood glucose DVT prophylaxis Hypertension End-stage renal disease hemodialysis Diabetes mellitus Morbid obesity Obstructive sleep apnea Anemia of chronic disease Gluteal cleft wound  Discharged Condition: Stable  Significant Diagnostic Studies: Dg Chest Port 1 View  Result Date: 12/07/2018 CLINICAL DATA:  Respiratory failure. EXAM: PORTABLE CHEST 1 VIEW COMPARISON:  12/06/2018. FINDINGS: Interim extubation removal of NG tube. Left subclavian line again noted coursing up the left IJ. Cardiomegaly with mild pulmonary venous congestion again noted. Stable bibasilar atelectasis/infiltrates. No pleural effusion or pneumothorax. IMPRESSION: 1. Interim extubation removal of NG tube. Left subclavian line again noted coursing up the left IJ. 2. Cardiomegaly with mild pulmonary venous congestion again noted. No interim change. 3. Stable bibasilar atelectasis/infiltrates. No pleural effusion or pneumothorax noted. Electronically Signed   By: Marcello Moores  Register   On: 12/07/2018 06:37   Dg Chest Port 1 View  Result Date: 12/06/2018 CLINICAL DATA:  Acute respiratory failure. EXAM: PORTABLE CHEST 1 VIEW COMPARISON:  12/05/2018. FINDINGS: Endotracheal tube, NG tube noted stable position. Left subclavian line noted coursing up the left IJ. This is in unchanged position. Stable cardiomegaly. Stable bibasilar atelectasis. IMPRESSION: 1. Endotracheal tube and NG tube stable position. Left subclavian line courses up the left IJ in unchanged position. 2.  Stable cardiomegaly. 3.  Stable bibasilar atelectasis. Electronically Signed   By: Marcello Moores  Register   On: 12/06/2018 06:39   Dg Chest Port 1 View  Result Date: 12/05/2018 CLINICAL DATA:  Acute respiratory failure EXAM:  PORTABLE CHEST 1 VIEW COMPARISON:  Chest x-rays dated 12/04/2018 and 12/03/2018 FINDINGS: Stable cardiomegaly. Endotracheal tube is adequately positioned with tip approximately 5 cm above the carina. Enteric tube passes below the diaphragm. LEFT subclavian central line with tip again directed upwards into the internal jugular vein. Streaky bibasilar opacities, most likely atelectasis. No new lung findings. No pleural effusion or pneumothorax seen. IMPRESSION: 1. Endotracheal tube adequately positioned with tip approximately 5 cm above the carina. 2. Stable cardiomegaly. 3. Streaky bibasilar opacities, most likely atelectasis. 4. LEFT subclavian central line with tip directed upwards into the LEFT internal jugular vein, as previously described. Electronically Signed   By: Franki Cabot M.D.   On: 12/05/2018 05:33   Dg Chest Port 1 View  Result Date: 12/04/2018 CLINICAL DATA:  Acute respiratory failure EXAM: PORTABLE CHEST 1 VIEW COMPARISON:  Chest x-rays dated 12/03/2018 and 12/02/2018. FINDINGS: Endotracheal tube remains well positioned with tip above the level of the carina. Enteric tube passes below the diaphragm. Grossly stable cardiomegaly. LEFT-sided subclavian line is directed into the LEFT internal jugular vein. Today's study is hypoinspiratory. Associated mild atelectasis at the RIGHT lung base. Additional opacity at the LEFT lung base, most likely atelectasis and/or small pleural effusion. IMPRESSION: 1. Support apparatus appears stable in position. LEFT subclavian catheter is directed into the LEFT internal jugular vein and would consider repositioning. 2. Low lung volumes. Bibasilar opacities, LEFT greater than right, most likely atelectasis and/or small pleural effusion. If febrile, pneumonia could not be excluded. These results will be called to the ordering clinician or representative by the Radiologist Assistant, and communication documented in the PACS or zVision Dashboard. Electronically Signed    By: Franki Cabot M.D.   On: 12/04/2018 07:04   Dg Chest Port 1 View  Result Date: 12/03/2018 CLINICAL  DATA:  Respiratory failure.  Hypoxia. EXAM: PORTABLE CHEST 1 VIEW COMPARISON:  12/02/2018. FINDINGS: Endotracheal tube and NG tube in stable position. Left subclavian catheter is again noted projected over the left internal jugular vein. Cardiomegaly. Interim partial clearing of bilateral pulmonary interstitial infiltrates/edema. No pleural effusion or pneumothorax. IMPRESSION: 1.  Lines and tubes in unchanged position. 2. Cardiomegaly. Interim partial clearing of bilateral pulmonary interstitial infiltrates/edema. Electronically Signed   By: Marcello Moores  Register   On: 12/03/2018 08:43   Dg Chest Port 1 View  Result Date: 12/02/2018 CLINICAL DATA:  Respiratory failure. EXAM: PORTABLE CHEST 1 VIEW COMPARISON:  Radiograph of December 01, 2018. FINDINGS: Stable cardiomediastinal silhouette. Endotracheal and nasogastric tubes are unchanged in position. Slightly improved bilateral lung opacities are noted suggesting improving pneumonia. Dominant opacity remains in right lung base. Stable position of left subclavian catheter which is directed into left internal jugular vein. Bony thorax is unremarkable. No pneumothorax or pleural effusion is noted. IMPRESSION: Mildly improved bilateral lung opacities are noted suggesting improving pneumonia. Electronically Signed   By: Marijo Conception M.D.   On: 12/02/2018 07:47   Dg Chest Port 1 View  Result Date: 12/01/2018 CLINICAL DATA:  Oxygen desaturation EXAM: PORTABLE CHEST 1 VIEW COMPARISON:  Yesterday FINDINGS: Diffuse airspace opacity which appears somewhat less dense than before, likely related to better lung volumes. Endotracheal tube tip is at the clavicular heads. The orogastric tube reaches the stomach. Left subclavian catheter rises in the IJ, tip not seen. This finding is known to the service based on prior documented communication. IMPRESSION: Stable hardware  positioning including left subclavian catheter terminating in the left IJ. Mildly improved lung volumes but continued extensive airspace disease. Electronically Signed   By: Monte Fantasia M.D.   On: 12/01/2018 07:10   Dg Chest Port 1 View  Result Date: 11/30/2018 CLINICAL DATA:  43 year old male status post central line placement. EXAM: PORTABLE CHEST 1 VIEW COMPARISON:  Earlier radiograph dated 11/30/2018 FINDINGS: A left subclavian central line extends superiorly in the left internal jugular vein. Recommend removal and repositioning. Endotracheal tube remains above the carina and enteric tube extends inferiorly beyond the inferior margin of the image. Bilateral confluent airspace opacities with overall interval worsening since the prior radiograph. No pneumothorax. Small pleural effusions may be present. Stable cardiomegaly. No acute osseous pathology. IMPRESSION: 1. Left subclavian central line extends superiorly in the left internal jugular vein. Recommend removal and repositioning. 2. Interval worsening of the bilateral airspace opacities. 3. No pneumothorax. These results were called by telephone at the time of interpretation on 11/30/2018 at 7:07 pm to Dr. [name] , who verbally acknowledged these results. Electronically Signed   By: Anner Crete M.D.   On: 11/30/2018 19:20   Dg Chest Portable 1 View  Result Date: 11/30/2018 CLINICAL DATA:  Intubation.  ESRD EXAM: PORTABLE CHEST 1 VIEW COMPARISON:  11/30/2018 FINDINGS: Endotracheal tube in good position.  NG tube enters the stomach. Diffuse bilateral airspace disease with progression from earlier today. No significant pleural effusion. IMPRESSION: Endotracheal tube in good position Significant progression of diffuse bilateral airspace disease which may represent edema or pneumonia. Electronically Signed   By: Franchot Gallo M.D.   On: 11/30/2018 15:23   Dg Chest Portable 1 View  Result Date: 11/30/2018 CLINICAL DATA:  Fever and hypoxia  EXAM: PORTABLE CHEST 1 VIEW COMPARISON:  May 10, 2018 FINDINGS: There is airspace consolidation in the right base region. There is more subtle airspace opacity in the left upper lobe. Heart  is enlarged with mild pulmonary venous hypertension. No adenopathy. There is aortic atherosclerosis. No bone lesions. IMPRESSION: Areas of airspace opacity felt to represent pneumonia in the right base and left upper lobe regions. Consolidation is greatest in the right base region. There is cardiomegaly with a degree of pulmonary vascular congestion. No adenopathy evident. Aortic Atherosclerosis (ICD10-I70.0). Electronically Signed   By: Lowella Grip III M.D.   On: 11/30/2018 09:09   US Abdomen Limited Ruq  Result Date: 12/02/2018 CLINICAL DATA:  Elevated LFTs. EXAM: ULTRASOUND ABDOMEN LIMITED RIGHT UPPER QUADRANT COMPARISON:  None. FINDINGS: Gallbladder: No gallstones or gallbladder wall thickening. No pericholecystic fluid. Common bile duct: Diameter: 5 mm Liver: Liver appears echogenic suggesting fatty deposition. Portal vein is patent on color Doppler imaging with normal direction of blood flow towards the liver. Other: Trace fluid noted adjacent to the liver. IMPRESSION: Patient is on a ventilator which limits assessment due to inability to reposition as normal part of the exam. Within this limitation, gallbladder is unremarkable and there is no evidence for biliary dilatation. Increased echogenicity of liver parenchyma suggests fatty deposition. Trace fluid adjacent to the liver. Electronically Signed   By: Misty Stanley M.D.   On: 12/02/2018 19:31    Labs:  Basic Metabolic Panel: Recent Labs  Lab 12/09/18 1500 12/10/18 0835 12/10/18 0855 12/11/18 1605 12/12/18 1033 12/13/18 0544 12/14/18 1603  NA  --   --  137 135 133*  --  130*  K  --   --  4.4 4.6 4.3  --  4.8  CL  --   --  99 95* 95*  --  91*  CO2  --   --  22 23 25   --  21*  GLUCOSE  --   --  87 118* 104*  --  100*  BUN  --   --  85*  103* 79*  --  119*  CREATININE  --   --  10.17* 12.09* 10.55*  --  14.95*  CALCIUM  --   --  9.2 9.0 9.0  --  9.0  MG 3.1*  --   --   --   --  2.8*  --   PHOS 7.4* 6.9*  --  8.4*  --   --  10.3*    CBC: Recent Labs  Lab 12/10/18 0855 12/13/18 0544 12/14/18 1603  WBC 13.7* 10.0 8.3  NEUTROABS 9.4* 6.4  --   HGB 8.1* 8.2* 7.7*  HCT 25.4* 26.9* 24.5*  MCV 86.4 87.9 86.9  PLT 231 327 284    CBG: Recent Labs  Lab 12/15/18 1137 12/15/18 1713 12/15/18 2159 12/16/18 0625 12/16/18 1111  GLUCAP 85 81 100* 79 80   Family history.  Father with diabetes and CVA.  Negative colon cancer  Brief HPI:   [name] is a 43 year old right-handed male history of end-stage renal disease with hemodialysis status post kidney transplant in May 2014 [facility], poorly controlled hypertension, diabetes mellitus, diastolic congestive heart failure, morbid obesity with BMI of 40, obstructive sleep apnea.  Lives alone independent prior to admission works as a Financial trader.  Presented 11/30/2018 with shortness of breath cough and chest pain.  He did have episode of emesis that was nonbloody.  Fever 100.4, severe hypertension with blood pressure 214/122, oxygen saturation 72%, hemoglobin 8.1, creatinine 15.23, lactic acid 1.2, COVID negative, urine drug screen negative.  Chest x-ray showed areas of aspects of opacity thought to represent pneumonia in the  right base and left upper lobes.  He did require intubation and self extubated 12/06/2018.  Oxygen saturations 92% room air.  Episodes of delirium he did receive Haldol.  Hemodialysis ongoing.  Subcutaneous heparin for DVT prophylaxis.  Patient with gluteal cleft wound with Santyl dressing applied change daily.  Therapy evaluations completed and patient was admitted for a comprehensive rehab program  Hospital Course: [name] was admitted to rehab 12/13/2018 for inpatient therapies to consist of PT, ST and OT at  least three hours five days a week. Past admission physiatrist, therapy team and rehab RN have worked together to provide customized collaborative inpatient rehab.  Pertaining to patient's debility secondary to ARDS pulmonary edema he continued progress nicely attending full therapies.  Subcutaneous heparin for DVT prophylaxis no bleeding episodes.  Blood pressures controlled and monitored noted history of medical noncompliance patient did receive counts regards to maintaining medical regimen.  He would follow-up with PCP.  Hemodialysis ongoing as per renal services.  Blood sugars overall controlled again patient with poor medical compliance receiving full diabetic teaching and blood sugars running from 79-100.  Morbid obesity BMI 30.35 with dietary follow-up.  Obstructive sleep apnea he was using CPAP.  Anemia of chronic disease 7.7 no bleeding episodes.  Patient with gluteal cleft wound dressing changes as advised family education provided to mother.  Physical exam.  Blood pressure 165/95 pulse 90 temperature 97.8 respirations 16 oxygen saturations 100% room air Constitutional.  Well-developed HEENT Head.  Normocephalic and atraumatic Eyes.  Pupils round and reactive to light without discharge no nystagmus Respiratory effort normal no respiratory distress without wheeze GI.  Soft nontender good bowel sounds without rebound Musculoskeletal.  Alert oriented follows commands fair medical historian strength grossly graded 4+ out of 5 throughout.  Rehab course: During patient's stay in rehab weekly team conferences were held to monitor patient's progress, set goals and discuss barriers to discharge. At admission, patient required minimal assist sit to stand, minimal assist 15 feet rolling walker, minimal guard sit to stand.  Max assist upper body bathing total assist lower body bathing max assist upper body dressing total assist lower body dressing  He  has had improvement in activity tolerance, balance,  postural control as well as ability to compensate for deficits. He has had improvement in functional use RUE/LUE  and RLE/LLE as well as improvement in awareness.  Patient ambulating 200 feet supervision without assistive device contact-guard for safety.  Dynamic gait training with assistive device to weave through obstacles.  Patient dresses sit to stand level edge of bed supervision Grooms with supervision standing at sink.  Ambulates to the bathroom.  Full family teaching was completed plan discharge to home.  Outpatient therapies have been arranged       Disposition: Discharge to home    Diet: Diabetic renal diet  Continue dialysis as directed  Special Instructions: No smoking driving or alcohol   Apply Santyl to the gluteal cleft wound 1/4 inch thick layer in place ABD into cleft change daily  Medications at discharge. 1.  Tylenol as needed 2.  Norvasc 5 mg daily 3.  Lopressor 25 mg twice daily 4.  Senokot S1 tablet daily 5.velphoro 2000 mg 3 times daily 6.Lipitor 20 mg daily  Follow-up Information    Meredith Staggers, MD Follow up.   Specialty: Physical Medicine and Rehabilitation Why: As directed Contact information: [address] [name] [address] [phone]           Signed:  Lavon Paganini [place] 12/16/2018, 12:47 PM

## 2018-12-16 NOTE — Progress Notes (Signed)
RT arrived to place pt on CPAP dream station for the night and pt stated he was not ready at this time but could place himself on when he is ready. RT added more sterile H2O to humidification reservoir for pt. Pt respiratory status stable at this time. RT will continue to monitor.

## 2018-12-16 NOTE — Progress Notes (Signed)
[place] [facility] PHYSICAL MEDICINE & REHABILITATION PROGRESS NOTE  Subjective/Complaints: Patient seen working with therapy this morning.  He states he slept fairly overnight.  He notes limited range of motion in shoulders.  ROS: Denies CP, SOB, N/V/D  Objective: Vital Signs: Blood pressure (!) 149/95, pulse 92, temperature 98 F (36.7 C), temperature source Oral, resp. rate 16, height 6\' 2"  (1.88 m), weight 135.9 kg, SpO2 99 %. No results found. Recent Labs    12/14/18 1603 12/16/18 1350  WBC 8.3 6.5  HGB 7.7* 7.4*  HCT 24.5* 23.7*  PLT 284 256   Recent Labs    12/14/18 1603 12/16/18 1350  NA 130* 131*  K 4.8 4.6  CL 91* 91*  CO2 21* 22  GLUCOSE 100* 106*  BUN 119* 82*  CREATININE 14.95* 13.08*  CALCIUM 9.0 9.0    Physical Exam: BP (!) 149/95   Pulse 92   Temp 98 F (36.7 C) (Oral)   Resp 16   Ht 6\' 2"  (1.88 m)   Wt 135.9 kg   SpO2 99%   BMI 38.47 kg/m  Constitutional: No distress . Vital signs reviewed. HENT: Normocephalic.  Atraumatic. Eyes: EOMI.  No discharge. Cardiovascular: No JVD. Respiratory: Normal effort. GI: Non-distended. Musc: No edema or tenderness in extremities Neurological: He is alert.  Follow commands fair medical historian Motor: Grossly 4+/5 throughout, unchanged Skin: Skin is warm and dry.  Psychiatric: He has a normal mood and affect. His behavior is normal.    Assessment/Plan: 1. Functional deficits secondary to debility which require 3+ hours per day of interdisciplinary therapy in a comprehensive inpatient rehab setting.  Physiatrist is providing close team supervision and 24 hour management of active medical problems listed below.  Physiatrist and rehab team continue to assess barriers to discharge/monitor patient progress toward functional and medical goals  Care Tool:  Bathing    Body parts bathed by patient: Right arm, Left upper leg, Right lower leg, Left arm, Chest, Abdomen, Left lower leg, Face, Front perineal area,  Buttocks, Right upper leg         Bathing assist Assist Level: Supervision/Verbal cueing     Upper Body Dressing/Undressing Upper body dressing   What is the patient wearing?: Pull over shirt    Upper body assist Assist Level: Supervision/Verbal cueing    Lower Body Dressing/Undressing Lower body dressing      What is the patient wearing?: Pants, Underwear/pull up     Lower body assist Assist for lower body dressing: Supervision/Verbal cueing     Toileting Toileting Toileting Activity did not occur (Clothing management and hygiene only): N/A (no void or bm)  Toileting assist Assist for toileting: Supervision/Verbal cueing     Transfers Chair/bed transfer  Transfers assist     Chair/bed transfer assist level: Supervision/Verbal cueing     Locomotion Ambulation   Ambulation assist      Assist level: Contact Guard/Touching assist Assistive device: No Device Max distance: 200   Walk 10 feet activity   Assist     Assist level: Contact Guard/Touching assist Assistive device: Hand held assist   Walk 50 feet activity   Assist Walk 50 feet with 2 turns activity did not occur: Safety/medical concerns  Assist level: Contact Guard/Touching assist      Walk 150 feet activity   Assist Walk 150 feet activity did not occur: Safety/medical concerns  Assist level: Contact Guard/Touching assist      Walk 10 feet on uneven surface  activity   Assist  Assist level: Minimal Assistance - Patient > 75% Assistive device: Aeronautical engineer Will patient use wheelchair at discharge?: No Type of Wheelchair: Manual    Wheelchair assist level: Supervision/Verbal cueing Max wheelchair distance: 200    Wheelchair 50 feet with 2 turns activity    Assist        Assist Level: Supervision/Verbal cueing   Wheelchair 150 feet activity     Assist     Assist Level: Supervision/Verbal cueing      Medical  Problem List and Plan: 1.  Debility secondary to ARDS/pulmonary edema with acute metabolic encephalopathy  Continue CIR   Plan for DC tomorrow 2.  Antithrombotics: -DVT/anticoagulation: Subcutaneous heparin.  Monitor for any bleeding episodes             -antiplatelet therapy: N/A 3. Pain Management: Tylenol as needed 4. Mood: Provide emotional support             -antipsychotic agents: N/A 5. Neuropsych: This patient is capable of making decisions on his own behalf. 6. Skin/Wound Care: Santyl to gluteal cleft wound daily.  Routine skin checks 7. Fluids/Electrolytes/Nutrition: Routine in and outs.  8. Essential hypertension:  Uncontrolled at baseline. Patient with history of medical noncompliance.    Lopressor 25 mg twice daily, Norvasc 5 mg daily.  Follow cardiology services as needed.   Elevated on 7/9, further adjustments per nephrology  Monitor with increased mobility 9.    ESRD.  Hemodialysis as directed 10.  Diabetes mellitus poor medical compliance.  SSI.  Check blood sugars before meals and at bedtime.  Diabetic teaching.   Controlled on 7/9  Monitor with increased mobility 11. Morbid obesity:  BMI 38.35.  Dietary follow-up.  Encourage weight loss. 12. OSA.  CPAP as directed. 13. Anemia of chronic disease  Hemoglobin 7.17/9  Continue to monitor  LOS: 3 days A FACE TO FACE EVALUATION WAS PERFORMED  Georgina Krist Lorie Phenix 12/16/2018, 3:08 PM

## 2018-12-16 NOTE — Progress Notes (Signed)
Slept fairly well throughout the night without incident. No prn medications given. No complaints noted. Dressing change complete to gluteal folds.

## 2018-12-16 NOTE — Patient Care Conference (Signed)
Inpatient RehabilitationTeam Conference and Plan of Care Update Date: 12/14/2018   Time: 10:35 AM    Patient Name: [name]      Medical Record Number: [id number]  Date of Birth: Jan 01, 1976 Sex: Male         Room/Bed: 4W02C/4W02C-01 Payor Info: Payor: Theme park manager / Plan: Theme park manager OTHER / Product Type: *No Product type* /    Admitting Diagnosis: 2. TBI Team Debility, malaise; 12-13days  Admit Date/Time:  12/13/2018  7:01 PM Admission Comments: No comment available   Primary Diagnosis:  <principal problem not specified> Principal Problem: <principal problem not specified>  Patient Active Problem List   Diagnosis Date Noted  . Labile blood glucose   . Anemia of chronic disease   . Debility 12/13/2018  . Diabetes mellitus type 2 in obese ([place])   . Medically noncompliant   . Pressure injury of skin 12/12/2018  . Sepsis ([place]) 11/30/2018  . Acute respiratory failure with hypoxia ([place]) 11/30/2018  . Acute encephalopathy   . Septic shock ([place])   . Hematuria 11/03/2018  . Genital warts 11/03/2018  . Meralgia paresthetica of right side 08/13/2018  . ESRD on dialysis ([place]) 06/04/2018  . Acute kidney injury superimposed on CKD ([place]) 05/10/2018  . Acidosis 05/10/2018  . Anemia 05/10/2018  . Acute renal failure superimposed on stage 5 chronic kidney disease, not on chronic dialysis ([place])   . Type 2 diabetes mellitus without complication, without long-term current use of insulin ([place]) 01/25/2018  . Essential hypertension 01/25/2017  . Well adult exam 01/17/2016  . Morbid obesity ([place]) 01/17/2016  . Delayed graft function of kidney transplant due to reason other than ATN or rejection requiring acute dialysis ([place]) 11/02/2012  . Immunosuppression ([place]) 11/02/2012  . Kidney replaced by transplant 11/02/2012  . OSA (obstructive sleep apnea) 05/26/2011    Expected Discharge Date: Expected Discharge Date: 12/17/18(after therapies)  Team Members Present: Physician  leading conference: Dr. Delice Lesch Social Worker Present: Lennart Pall, LCSW Nurse Present: Dorthula Nettles, RN PT Present: Donnamae Jude, PT OT Present: Laverle Hobby, OT SLP Present: Weston Anna, SLP     Current Status/Progress Goal Weekly Team Focus  Medical   Debility secondary to ARDS/pulmonary edema with acute metabolic encephalopathy  Improve mobility, DM/HTN, medical complaince  See above   Bowel/Bladder   Patient anuric, Continent of bowel LBM 12/12/18  Remain continent of bowel  assess tolieting needs qshift/prn   Swallow/Nutrition/ Hydration             ADL's   min A transfers, CGA ADLs, limited functional activity tolerance  Mod I overall  ADL transfers, ADL retraining, functional activity tolerance, pressure relief, d/c planning   Mobility   independent with bed mobility, CGA for transfers with or without AD, min A with RW for up to 150', min A stairs B rails  Mod I overall with RW  Gait training, BLE strengthening, pt education on pressure relief, standing balance, endurance   Communication             Safety/Cognition/ Behavioral Observations            Pain   no complaints of pain  remain pain free 0/10  assess pain q shift/prn   Skin   Left thigh fistula, Stage two on sacrum, Santyl ABD pad daily dressing applied. MASD around abdominal folds.  remains intact, free from infection and breakdown  assess skin issues qshift and prn    Rehab Goals Patient on target to meet rehab  goals: Yes *See Care Plan and progress notes for long and short-term goals.     Barriers to Discharge  Current Status/Progress Possible Resolutions Date Resolved   Physician    Medical stability;Weight;Medication compliance;Hemodialysis     See above  Therapies, optimize DM/HTN meds, encourage compliance      Nursing  Decreased caregiver support;Medical stability;Wound Care;Nutrition means;Lack of/limited family support;Medication compliance;Hemodialysis               PT  Medical  stability;Inaccessible home environment  Question whether he will be able to ambulate/exercise due to decreased room in home.              OT Decreased caregiver support                SLP                SW                Discharge Planning/Teaching Needs:  Returning home alone with assist from parents prn  NA   Team Discussion:  New eval;  adjusting BP;  HD and sleep apnea.  Min assist tfs; CG with ADLs.  PT eval pending.  Goals at mod independent  Revisions to Treatment Plan:  NA    Continued Need for Acute Rehabilitation Level of Care: The patient requires daily medical management by a physician with specialized training in physical medicine and rehabilitation for the following conditions: Daily direction of a multidisciplinary physical rehabilitation program to ensure safe treatment while eliciting the highest outcome that is of practical value to the patient.: Yes Daily medical management of patient stability for increased activity during participation in an intensive rehabilitation regime.: Yes Daily analysis of laboratory values and/or radiology reports with any subsequent need for medication adjustment of medical intervention for : Diabetes problems;Blood pressure problems;Renal problems   I attest that I was present, lead the team conference, and concur with the assessment and plan of the team.   Samhitha Rosen 12/16/2018, 4:16 PM    Team conference was held via web/ teleconference due to [place] - 19

## 2018-12-16 NOTE — Progress Notes (Signed)
Renal Navigator has notified OP HD clinic/SW of patient's plan for discharge from CIR tomorrow, 7/10 to provide continuity of care.  Alphonzo Cruise, [place] Renal Navigator [phone]

## 2018-12-16 NOTE — Care Management (Signed)
Inpatient [facility] Individual Statement of Services  Patient Name:  [name]  Date:  12/16/2018  Welcome to the [facility].  Our goal is to provide you with an individualized program based on your diagnosis and situation, designed to meet your specific needs.  With this comprehensive rehabilitation program, you will be expected to participate in at least 3 hours of rehabilitation therapies Monday-Friday, with modified therapy programming on the weekends.  Your rehabilitation program will include the following services:  Physical Therapy (PT), Occupational Therapy (OT), 24 hour per day rehabilitation nursing, Case Management (Social Worker), Rehabilitation Medicine, Nutrition Services and Pharmacy Services  Weekly team conferences will be held on Tuesdays to discuss your progress.  Your Social Worker will talk with you frequently to get your input and to update you on team discussions.  Team conferences with you and your family in attendance may also be held.  Expected length of stay: 5-7 days   Overall anticipated outcome: modified independent  Depending on your progress and recovery, your program may change. Your Social Worker will coordinate services and will keep you informed of any changes. Your Social Worker's name and contact numbers are listed  below.  The following services may also be recommended but are not provided by the [facility] will be made to provide these services after discharge if needed.  Arrangements include referral to agencies that provide these services.  Your insurance has been verified to be:  [facility] Your primary doctor is:  Zigmund Daniel  Pertinent information will be shared with your doctor and your insurance company.  Social Worker:  [place], [place] or  ([phone]   Information discussed with and copy given to patient by: Lennart Pall, 12/16/2018, 4:15 PM

## 2018-12-17 ENCOUNTER — Inpatient Hospital Stay (HOSPITAL_COMMUNITY): Payer: 59 | Admitting: Occupational Therapy

## 2018-12-17 ENCOUNTER — Inpatient Hospital Stay (HOSPITAL_COMMUNITY): Payer: 59 | Admitting: Physical Therapy

## 2018-12-17 LAB — GLUCOSE, CAPILLARY
Glucose-Capillary: 84 mg/dL (ref 70–99)
Glucose-Capillary: 95 mg/dL (ref 70–99)

## 2018-12-17 NOTE — Progress Notes (Signed)
Social Work Discharge Note   The overall goal for the admission was met for:   Discharge location: Yes - returning to his own home with mother to stay several days   Length of Stay: Yes - 4 days  Discharge activity level: Yes - modified independent  Home/community participation: Yes  Services provided included: MD, RD, PT, OT, RN, Pharmacy and [place]: Private Insurance: [facility]  Follow-up services arranged: Outpatient: PT via [facility] @ [address], DME: rolling walker via [place] and Patient/Family has no preference for HH/DME agencies  Comments (or additional information):        Contact info:  Pt @ [phone]     Mother, Genia Hotter @ [phone]  Patient/Family verbalized understanding of follow-up arrangements: Yes  Individual responsible for coordination of the follow-up plan: pt  Confirmed correct DME delivered: Lennart Pall 12/17/2018    Davidson Palmieri, [place]

## 2018-12-17 NOTE — Discharge Instructions (Signed)
Inpatient Rehab Discharge Instructions  [facility] Discharge date and time: No discharge date for patient encounter.   Activities/Precautions/ Functional Status: Activity: activity as tolerated Diet: renal diet Wound Care: keep wound clean and dry Functional status:  ___ No restrictions     ___ Walk up steps independently ___ 24/7 supervision/assistance   ___ Walk up steps with assistance ___ Intermittent supervision/assistance  ___ Bathe/dress independently ___ Walk with walker     _x__ Bathe/dress with assistance ___ Walk Independently    ___ Shower independently ___ Walk with assistance    ___ Shower with assistance ___ No alcohol     ___ Return to work/school ________     COMMUNITY REFERRALS UPON DISCHARGE:    Outpatient: PT                    Agency:  [facility] @ [place] [phone]  [place], [place]  [id number])      Phone:  [phone]                Appointment Date/Time:  12/22/18 @ 5:00 pm (please arrive 15 mins prior to appt)   Medical Equipment/Items Ordered: rolling walker                                                    Agency/Supplier:  [facility] @ [phone]    Special Instructions: Continue dialysis as directed  No smoking drinking or driving   Apply SANTYL to gluteal cleft wound 1/4 inch thick layer and placed ABD into cleft.  Change daily   My questions have been answered and I understand these instructions. I will adhere to these goals and the provided educational materials after my discharge from the hospital.  Patient/Caregiver Signature _______________________________ Date __________  Clinician Signature _______________________________________ Date __________  Please bring this form and your medication list with you to all your follow-up doctor's appointments.

## 2018-12-17 NOTE — Progress Notes (Signed)
Parent of patient demonstrated understanding of dressing change. Patient discharged with parent, all belongings accounted for, no further questions at this time.

## 2018-12-17 NOTE — Progress Notes (Signed)
Occupational Therapy Discharge Summary  Patient Details  Name: AITHAN FARRELLY MRN: [id number] Date of Birth: 1976/04/10  Today's Date: 12/17/2018 OT Individual Time: 1000-1030 OT Individual Time Calculation (min): 30 min   Patient seated at edge of bed, states that he is ready and anxious to go home.  Reviewed safety plan for transition to home, he completed adl tasks, reviewed bilateral shoulder ROM and HEP.  Overall he is functioning at a mod I level and demonstrates good understanding of his current endurance deficits.   Patient has met 9 of 9 long term goals due to improved activity tolerance and improved balance.  Patient to discharge at overall Modified Independent level.   Reasons goals not met: na  Recommendation:  Patient will benefit from ongoing skilled OT services in outpatient setting to continue to advance functional skills in the area of BADL.  Equipment: No equipment provided  Reasons for discharge: treatment goals met  Patient/family agrees with progress made and goals achieved: Yes  OT Discharge Precautions/Restrictions  Precautions Precautions: Fall Restrictions Weight Bearing Restrictions: No General   Vital Signs  Pain Pain Assessment Pain Scale: 0-10 Pain Score: 0-No pain ADL ADL Eating: Independent Where Assessed-Eating: Edge of bed Grooming: Independent Where Assessed-Grooming: Standing at sink Upper Body Bathing: Independent Where Assessed-Upper Body Bathing: Shower Lower Body Bathing: Modified independent Where Assessed-Lower Body Bathing: Shower Upper Body Dressing: Independent Where Assessed-Upper Body Dressing: Edge of bed Lower Body Dressing: Independent Where Assessed-Lower Body Dressing: Edge of bed Toileting: Independent Where Assessed-Toileting: Glass blower/designer: Diplomatic Services operational officer Method: Counselling psychologist: Energy manager: Child psychotherapist Method: Heritage manager: Radio broadcast assistant Vision Baseline Vision/History: Wears glasses Wears Glasses: At all times Patient Visual Report: No change from baseline Vision Assessment?: No apparent visual deficits Perception  Perception: Within Functional Limits Praxis Praxis: Intact Cognition Overall Cognitive Status: Within Functional Limits for tasks assessed Orientation Level: Oriented X4 Sensation Sensation Light Touch: Appears Intact Hot/Cold: Appears Intact Coordination Gross Motor Movements are Fluid and Coordinated: Yes Fine Motor Movements are Fluid and Coordinated: Yes Finger Nose Finger Test: Limited by shoulder AROM Motor    Mobility  Bed Mobility Supine to Sit: Independent Sit to Supine: Independent Transfers Sit to Stand: Independent Stand to Sit: Independent  Trunk/Postural Assessment  Postural Control Postural Control: Within Functional Limits  Balance Static Sitting Balance Static Sitting - Balance Support: Feet supported Static Sitting - Level of Assistance: 7: Independent Dynamic Sitting Balance Dynamic Sitting - Balance Support: During functional activity Dynamic Sitting - Level of Assistance: 7: Independent Static Standing Balance Static Standing - Balance Support: During functional activity Static Standing - Level of Assistance: 6: Modified independent (Device/Increase time) Dynamic Standing Balance Dynamic Standing - Balance Support: During functional activity Dynamic Standing - Level of Assistance: 6: Modified independent (Device/Increase time) Extremity/Trunk Assessment RUE Assessment Passive Range of Motion (PROM) Comments: WFL General Strength Comments: no pain, shoulder flexion less than 20 degrees, abd 45, with UE positioned in ER and 90 of abd he is able to extend and hold arm OH, ER WFL, distal AROM WNL LUE Assessment Passive Range of Motion (PROM) Comments: [facility] General Strength Comments: left shoulder  with extreme elevation of scapula with attempts to flex or abduct, no pain with PROM, less than 20 degrees of abd, less than 10 degrees of flexion, distal WNL   Kaedan Richert A Trent Gabler 12/17/2018, 10:48 AM

## 2018-12-17 NOTE — Plan of Care (Signed)
  Problem: Consults Goal: RH GENERAL PATIENT EDUCATION Description: See Patient Education module for education specifics. Outcome: Progressing   Problem: Consults Goal: Skin Care Protocol Initiated - if Braden Score 18 or less Description: If consults are not indicated, leave blank or document N/A Outcome: Progressing   Problem: RH SAFETY Goal: RH STG ADHERE TO SAFETY PRECAUTIONS W/ASSISTANCE/DEVICE Description: STG Adhere to Safety Precautions With Assistance/Device. Mod I Outcome: Progressing   Problem: RH SKIN INTEGRITY Goal: RH STG SKIN FREE OF INFECTION/BREAKDOWN Outcome: Progressing

## 2018-12-17 NOTE — Plan of Care (Signed)
  Problem: RH BOWEL ELIMINATION Goal: RH STG MANAGE BOWEL W/MEDICATION W/ASSISTANCE Description: STG Manage Bowel with Medication with mod I Assistance. Outcome: Adequate for Discharge   Problem: Consults Goal: Nutrition Consult-if indicated Outcome: Adequate for Discharge   Problem: Consults Goal: Skin Care Protocol Initiated - if Braden Score 18 or less Description: If consults are not indicated, leave blank or document N/A 12/17/2018 1038 by Dolores Hoose, RN Outcome: Adequate for Discharge 12/17/2018 0749 by Dolores Hoose, RN Outcome: Progressing   Problem: Consults Goal: RH GENERAL PATIENT EDUCATION Description: See Patient Education module for education specifics. 12/17/2018 1038 by Dolores Hoose, RN Outcome: Adequate for Discharge

## 2018-12-17 NOTE — Progress Notes (Addendum)
Physical Therapy Discharge Summary  Patient Details  Name: [name] MRN: [id number] Date of Birth: 11-10-1975  Today's Date: 12/17/2018 PT Individual Time: 0905-1000 PT Individual Time Calculation (min): 55 min    Patient has met 7 of 8 long term goals due to improved activity tolerance, improved balance, improved postural control and increased strength.  Patient to discharge at an ambulatory level mod I with RW.     Reasons goals not met: slightly impaired balance when negotiating stairs  Recommendation:  Patient will benefit from ongoing skilled PT services in outpatient setting to continue to advance safe functional mobility, address ongoing impairments in balance, endurance, progress gait with LRAD, and minimize fall risk.  Equipment: RW  Reasons for discharge: treatment goals met and discharge from hospital  Patient/family agrees with progress made and goals achieved: Yes  Skilled PT Treatment: Pt received in bed & agreeable to tx, no c/o pain reported. Pt transfers to EOB & dons teds & non skid socks with set up assist. Pt reports need to use restroom and ambulates in room/bathroom with RW & mod I, completing toilet transfer with mod I & peri hygiene independently after a BM. RN changes dressing & pt denies any concerns re: d/c home today. Pt ambulates around unit with RW & mod I, completes car transfer at SUV simulated height with mod I, negotiates ramp with RW & mod I, uneven surface with RW & supervision, and negotiates 24 steps (6" + 3") with B rails and distant supervision. Pt completes TUG in average of 13.08 seconds without AD; educated pt on test. Pt utilized nu-step on level 4 x 6 minutes with all four extremities with focus on global strengthening & endurance training. At end of session pt left sitting on EOB awaiting arrival of OT.  PT Discharge Precautions/Restrictions Precautions Precautions: None Restrictions Weight Bearing Restrictions: No  Pain No c/o  pain.  Vision/Perception  Pt wears glasses at all times at baseline. No changes in baseline vision. Perception Perception: Within Functional Limits Praxis Praxis: Intact   Cognition Overall Cognitive Status: Within Functional Limits for tasks assessed Arousal/Alertness: Awake/alert Orientation Level: Oriented X4 Memory: Appears intact Awareness: Appears intact Problem Solving: Appears intact Safety/Judgment: Appears intact  Sensation Sensation Light Touch: Not tested Proprioception: Appears Intact Coordination Gross Motor Movements are Fluid and Coordinated: Yes Fine Motor Movements are Fluid and Coordinated: Yes  Motor  Motor Motor: Abnormal postural alignment and control Motor - Discharge Observations: generalized deconditioning/weakness   Mobility Bed Mobility Supine to Sit: Independent Sit to Supine: Independent Transfers Sit to Stand: Independent Stand to Sit: Independent Stand Pivot Transfers: Independent with assistive device Transfer (Assistive device): Rolling walker  Locomotion  Gait Ambulation: Yes Gait Assistance: Independent with assistive device Gait Distance (Feet): 200 Feet Assistive device: Rolling walker Gait Gait: Yes Gait Pattern: Decreased stride length;Narrow base of support Gait velocity: decreased Stairs / Additional Locomotion Stairs: Yes Stairs Assistance: Supervision/Verbal cueing Stair Management Technique: Two rails Number of Stairs: 24 Height of Stairs: (6" + 3") Ramp: Independent with assistive device(ambulatory with RW) Wheelchair Mobility Wheelchair Mobility: No   Trunk/Postural Assessment  Cervical Assessment Cervical Assessment: Exceptions to [facility] Cervical AROM Overall Cervical AROM Comments: Forward head Thoracic Assessment Thoracic Assessment: Exceptions to [facility] Thoracic AROM Overall Thoracic AROM Comments: rounded shoulders Lumbar Assessment Lumbar Assessment: Exceptions to [place] Lumbar AROM Overall Lumbar AROM  Comments: posterior pelvic tilt Postural Control Postural Control: Deficits on evaluation Righting Reactions: slightly delayed 2/2 impaired balance, weakness   Balance Balance  Balance Assessed: Yes Standardized Balance Assessment Standardized Balance Assessment: Timed Up and Go Test Timed Up and Go Test TUG: Normal TUG Normal TUG (seconds): 13.08(no AD, average of 3 trials) Dynamic Standing Balance Dynamic Standing - Balance Support: During functional activity;No upper extremity supported Dynamic Standing - Level of Assistance: 6: Modified independent (Device/Increase time)  Extremity Assessment  RUE Assessment RUE Assessment: Within Functional Limits LUE Assessment LUE Assessment: Within Functional Limits  RLE Assessment RLE Assessment: Within Functional Limits LLE Assessment LLE Assessment: Within Functional Limits    Waunita Schooner 12/17/2018, 1:00 PM

## 2018-12-17 NOTE — Progress Notes (Signed)
[place] PHYSICAL MEDICINE & REHABILITATION PROGRESS NOTE  Subjective/Complaints: Patient seen laying in bed this morning.  He states he slept fairly overnight.  He is ready for discharge after therapies today.  ROS: Denies CP, SOB, N/V/D  Objective: Vital Signs: Blood pressure (!) 166/79, pulse (!) 103, temperature 98.7 F (37.1 C), temperature source Oral, resp. rate 18, height 6\' 2"  (1.88 m), weight 133.8 kg, SpO2 100 %. No results found. Recent Labs    12/14/18 1603 12/16/18 1350  WBC 8.3 6.5  HGB 7.7* 7.4*  HCT 24.5* 23.7*  PLT 284 256   Recent Labs    12/14/18 1603 12/16/18 1350  NA 130* 131*  K 4.8 4.6  CL 91* 91*  CO2 21* 22  GLUCOSE 100* 106*  BUN 119* 82*  CREATININE 14.95* 13.08*  CALCIUM 9.0 9.0    Physical Exam: BP (!) 166/79 (BP Location: Left Arm)   Pulse (!) 103   Temp 98.7 F (37.1 C) (Oral)   Resp 18   Ht 6\' 2"  (1.88 m)   Wt 133.8 kg   SpO2 100%   BMI 37.87 kg/m  Constitutional: No distress . Vital signs reviewed. HENT: Normocephalic.  Atraumatic. Eyes: EOMI.  No discharge. Cardiovascular: No JVD. Respiratory: Normal effort. GI: Non-distended. Musc: No edema or tenderness in extremities Neurological: He is alert.  Follow commands fair medical historian Motor: Grossly 4+/5 throughout, stable Skin: Skin is warm and dry.  Psychiatric: He has a normal mood and affect. His behavior is normal.    Assessment/Plan: 1. Functional deficits secondary to debility which require 3+ hours per day of interdisciplinary therapy in a comprehensive inpatient rehab setting.  Physiatrist is providing close team supervision and 24 hour management of active medical problems listed below.  Physiatrist and rehab team continue to assess barriers to discharge/monitor patient progress toward functional and medical goals  Care Tool:  Bathing    Body parts bathed by patient: Right arm, Left upper leg, Right lower leg, Left arm, Chest, Abdomen, Left lower  leg, Face, Front perineal area, Buttocks, Right upper leg         Bathing assist Assist Level: Independent with assistive device     Upper Body Dressing/Undressing Upper body dressing   What is the patient wearing?: Pull over shirt    Upper body assist Assist Level: Independent    Lower Body Dressing/Undressing Lower body dressing      What is the patient wearing?: Underwear/pull up, Pants     Lower body assist Assist for lower body dressing: Independent     Toileting Toileting Toileting Activity did not occur (Clothing management and hygiene only): N/A (no void or bm)  Toileting assist Assist for toileting: Supervision/Verbal cueing     Transfers Chair/bed transfer  Transfers assist     Chair/bed transfer assist level: Supervision/Verbal cueing     Locomotion Ambulation   Ambulation assist      Assist level: Supervision/Verbal cueing Assistive device: Walker-rolling Max distance: 200   Walk 10 feet activity   Assist     Assist level: Supervision/Verbal cueing Assistive device: Walker-rolling   Walk 50 feet activity   Assist Walk 50 feet with 2 turns activity did not occur: Safety/medical concerns  Assist level: Supervision/Verbal cueing Assistive device: Walker-rolling    Walk 150 feet activity   Assist Walk 150 feet activity did not occur: Safety/medical concerns  Assist level: Supervision/Verbal cueing Assistive device: Walker-rolling    Walk 10 feet on uneven surface  activity   Assist  Assist level: Minimal Assistance - Patient > 75% Assistive device: Aeronautical engineer Will patient use wheelchair at discharge?: No Type of Wheelchair: Manual    Wheelchair assist level: Supervision/Verbal cueing Max wheelchair distance: 200    Wheelchair 50 feet with 2 turns activity    Assist        Assist Level: Supervision/Verbal cueing   Wheelchair 150 feet activity     Assist      Assist Level: Supervision/Verbal cueing      Medical Problem List and Plan: 1.  Debility secondary to ARDS/pulmonary edema with acute metabolic encephalopathy  DC today after therapies 2.  Antithrombotics: -DVT/anticoagulation: Subcutaneous heparin.  Monitor for any bleeding episodes             -antiplatelet therapy: N/A 3. Pain Management: Tylenol as needed 4. Mood: Provide emotional support             -antipsychotic agents: N/A 5. Neuropsych: This patient is capable of making decisions on his own behalf. 6. Skin/Wound Care: Santyl to gluteal cleft wound daily.  Routine skin checks 7. Fluids/Electrolytes/Nutrition: Routine in and outs.  8. Essential hypertension:  Uncontrolled at baseline. Patient with history of medical noncompliance.    Lopressor 25 mg twice daily, Norvasc 5 mg daily.  Follow cardiology services as needed.   Labile on 7/10, further adjustments per nephrology  Monitor with increased mobility 9.    ESRD.  Hemodialysis as directed 10.  Diabetes mellitus poor medical compliance.  SSI.  Check blood sugars before meals and at bedtime.  Diabetic teaching.   Controlled on 7/10  Monitor with increased mobility 11. Morbid obesity:  BMI 38.35.  Dietary follow-up.  Encourage weight loss. 12. OSA.  CPAP as directed. 13. Anemia of chronic disease  Hemoglobin 7.4 on 7/9  Continue to monitor  LOS: 4 days A FACE TO FACE EVALUATION WAS PERFORMED  Ankit Lorie Phenix 12/17/2018, 11:04 AM

## 2018-12-20 ENCOUNTER — Telehealth: Payer: Self-pay | Admitting: Behavioral Health

## 2018-12-20 NOTE — Telephone Encounter (Signed)
Patient declined TCM/Hospital Follow-up at this time. He voiced that "right now he would like to see how things go with rehab & then reach out to PCP as needed".

## 2018-12-22 ENCOUNTER — Other Ambulatory Visit: Payer: Self-pay

## 2018-12-22 ENCOUNTER — Encounter: Payer: Self-pay | Admitting: Physical Therapy

## 2018-12-22 ENCOUNTER — Ambulatory Visit: Payer: 59 | Attending: Physical Medicine and Rehabilitation | Admitting: Physical Therapy

## 2018-12-22 DIAGNOSIS — R262 Difficulty in walking, not elsewhere classified: Secondary | ICD-10-CM

## 2018-12-22 DIAGNOSIS — M6281 Muscle weakness (generalized): Secondary | ICD-10-CM

## 2018-12-22 NOTE — Therapy (Signed)
[place], [name], [address] Phone: [phone]   Fax:  [phone]  Physical Therapy Evaluation  Patient Details  Name: [name] MRN: [id number] Date of Birth: [birth year] Referring Provider (PT): Vickey Sages Date: 12/22/2018  PT End of Session - 12/22/18 3559    Visit Number  1    Number of Visits  25    Date for PT Re-Evaluation  02/22/19    PT Start Time  7416    PT Stop Time  1738    PT Time Calculation (min)  43 min    Activity Tolerance  Patient tolerated treatment well    Behavior During Therapy  [facility] for tasks assessed/performed       Past Medical History:  Diagnosis Date  . Anemia    ESRD  . CHF (congestive heart failure) ([place])   . Diabetes mellitus without complication ([place])   . ESRD on hemodialysis ([place]) 06/07/2011   TTS [employer]. Started HD in 2006, got transplant in 2014 lasted until Dec 2019 then went back on HD.  Has L thigh AVG as of Jun 2020.  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   [place] GERD (gastroesophageal reflux disease)   . Hypertension 111/29/2012   pt states he takes no HTN meds  . Hypothyroidism, secondary   . Morbid obesity ([place])   . Paroxysmal atrial fibrillation ([facility])   . Sleep apnea 06/07/2011   Pt had sllep study done 2 weeks ago- Dr. Joellyn Quails.  Does not  have a CPAP at this time.    Past Surgical History:  Procedure Laterality Date  . ARTERIOVENOUS GRAFT PLACEMENT  08/09/10   Left Thigh Graft by Dr. [name]  . AV FISTULA PLACEMENT Right 05/18/2018   Procedure: INSERTION OF ARTERIOVENOUS (AV) GORE-TEX GRAFT Left THIGH;  Surgeon: Waynetta Sandy, MD;  Location: [facility];  Service: Vascular;  Laterality: Right;  . CAPD REMOVAL  05/08/2011   Procedure: CONTINUOUS AMBULATORY PERITONEAL DIALYSIS  (CAPD) CATHETER REMOVAL;  Surgeon: [name], MD;  Location: [facility] OR;  Service: General;  Laterality: N/A;  Removal  of CAPD catheter, Dr. requests to go after 100  . INSERTION OF DIALYSIS CATHETER  10/05/10   Right Femoral Cath insertion by Dr. [name].  Pt ahas had several caths inserted.  . IR FLUORO GUIDE CV LINE RIGHT  05/11/2018  . IR US GUIDE VASC ACCESS RIGHT  05/11/2018  . KIDNEY TRANSPLANT  2014  . UPPER EXTREMITY ANGIOGRAPHY Bilateral 05/13/2018   Procedure: UPPER EXTREMITY ANGIOGRAPHY - bilarteral;  Surgeon: Marty Heck, MD;  Location: [facility];  Service: Cardiovascular;  Laterality: Bilateral;    There were no vitals filed for this visit.   Subjective Assessment - 12/22/18 1702    Subjective  Patient was admitted the the hospital June 23rd, with a fever and pneumonia, he became septic, reports that he was on a ventilator for 3 days.  He was discharged from the hospital on July 10th.  He has a complicated medical history with ESRD.  He reports that he is very weak and has difficulty.  He reports that prior to hospital admission he was doing fine except for dialysis 3x/week    Limitations  Walking    How long can you stand comfortably?  4-5 minutes    How long can you walk comfortably?  300 feet    Patient Stated Goals  would like to be able to get back to work, be able to be strong again    Currently in Pain?  Yes    Pain Score  3     Pain Location  Shoulder    Pain Orientation  Right;Left    Pain Descriptors / Indicators  Tightness    Pain Type  Acute pain    Pain Radiating Towards  right thumb numbness    Pain Onset  1 to 4 weeks ago    Pain Frequency  Constant    Aggravating Factors   I just feel really weak    Effect of Pain on Daily Activities  difficulty with all ADL's         [facility] PT Assessment - 12/22/18 0001      Assessment   Medical Diagnosis  Debility, weakness    Referring Provider (PT)  Naaman Plummer    Onset Date/Surgical Date  11/30/18      Precautions   Precautions  None      Balance Screen   Has the patient fallen in the past 6 months  No     Has the patient had a decrease in activity level because of a fear of falling?   No    Is the patient reluctant to leave their home because of a fear of falling?   No      Home Environment   Additional Comments  a few steps, prior to this episode was doing housework and yardwork      Prior Function   Level of Independence  Independent    Vocation  Full time employment    Vocation Requirements  12 hours standing, lifting under 40#    Leisure  was doing light body weight exercises prior to this episode      ROM / Strength   AROM / PROM / Strength  Strength      AROM   Right Shoulder Flexion  90 Degrees    Right Shoulder ABduction  95 Degrees    Left Shoulder Flexion  40 Degrees    Left Shoulder ABduction  40 Degrees      Strength   Overall Strength Comments  shoulder IR 4-/5 , ER 3/5, flexion and abduction 2/5, hips 3+/5 , knees 3+/5, ankles 4-/5      Palpation   Palpation comment  non tender, deltoid seems to activate, the Suprispinatius and the infra activate as well, he just cannot lift arms against gravity      Ambulation/Gait   Gait Comments  in the hospital he was walking with a walker, today no deivce, slow,       6 Minute Walk- Baseline   6 Minute Walk- Baseline  yes    HR (bpm)  105    02 Sat (%RA)  97 %      6 Minute walk- Post Test   6 Minute Walk Post Test  yes    HR (bpm)  130    02 Sat (%RA)  98 %      6 minute walk test results    Aerobic Endurance Distance Walked  750    Endurance additional comments  5 minutes      Standardized Balance Assessment   Standardized Balance Assessment  Timed Up and Go Test      Timed Up and Go Test   Normal TUG (seconds)  15                Objective measurements completed  on examination: See above findings.                   PT Long Term Goals - 12/22/18 1752      PT LONG TERM GOAL #1   Title  increase shoulder ROM to 140 degrees flexion    Time  8    Period  Weeks    Status  New      PT  LONG TERM GOAL #2   Title  increase LE strength to 4+/5    Time  8    Period  Weeks    Status  New      PT LONG TERM GOAL #3   Title  walk .75 miles    Time  8    Period  Weeks    Status  New      PT LONG TERM GOAL #4   Title  decrease TUG time to 11 seconds    Time  8    Period  Weeks    Status  New             Plan - 12/22/18 1749    Clinical Impression Statement  Patietn was in the hospital for about 3 weeks due to pneumonia, he became septic, reports he was on a ventilator for 10 days.  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    Stability/Clinical Decision Making  Evolving/Moderate complexity    Clinical Decision Making  Moderate    Rehab Potential  Good    PT Frequency  2x / week    PT Duration  8 weeks    PT Treatment/Interventions  ADLs/Self Care Home Management;Gait training;Stair training;Functional mobility training;Neuromuscular re-education;Balance training;Therapeutic exercise;Therapeutic activities;Patient/family education;Manual techniques    PT Next Visit Plan  start exercises, allow rest as needed, work on strength , careful with shoulders    Consulted and Agree with Plan of Care  Patient       Patient will benefit from skilled therapeutic intervention in order to improve the following deficits and impairments:  Postural dysfunction, Cardiopulmonary status limiting activity, Decreased activity tolerance, Decreased endurance, Decreased range of motion, Decreased strength, Impaired UE functional use, Difficulty walking, Decreased balance  Visit Diagnosis: 1. Muscle weakness (generalized)   2. Difficulty in walking, not elsewhere classified        Problem List Patient Active Problem List   Diagnosis Date Noted  . Labile blood glucose   . Anemia of chronic disease   . Debility 12/13/2018  . Diabetes  mellitus type 2 in obese ([place])   . Medically noncompliant   . Pressure injury of skin 12/12/2018  . Sepsis ([place]) 11/30/2018  . Acute respiratory failure with hypoxia ([place]) 11/30/2018  . Acute encephalopathy   . Septic shock ([place])   . Hematuria 11/03/2018  . Genital warts 11/03/2018  . Meralgia paresthetica of right side 08/13/2018  . ESRD on dialysis ([place]) 06/04/2018  . Acute kidney injury superimposed on CKD ([place]) 05/10/2018  . Acidosis 05/10/2018  . Anemia 05/10/2018  . Acute renal failure superimposed on stage 5 chronic kidney disease, not on chronic dialysis ([place])   . Type 2 diabetes mellitus without complication, without long-term current use of insulin ([place]) 01/25/2018  . Essential hypertension 01/25/2017  . Well adult exam 01/17/2016  . Morbid obesity ([place]) 01/17/2016  . Delayed  graft function of kidney transplant due to reason other than ATN or rejection requiring acute dialysis ([place]) 11/02/2012  . Immunosuppression ([place]) 11/02/2012  . Kidney replaced by transplant 11/02/2012  . OSA (obstructive sleep apnea) 05/26/2011    Sumner Boast., PT 12/22/2018, 5:56 PM  [place], [name], [address] Phone: [phone]   Fax:  [phone]  Name: ZYEN TRIGGS MRN: [id number] Date of Birth: 1976/05/10

## 2018-12-27 ENCOUNTER — Ambulatory Visit: Payer: 59 | Admitting: Physical Therapy

## 2018-12-27 ENCOUNTER — Other Ambulatory Visit: Payer: Self-pay

## 2018-12-27 ENCOUNTER — Encounter: Payer: Self-pay | Admitting: Physical Therapy

## 2018-12-27 DIAGNOSIS — M6281 Muscle weakness (generalized): Secondary | ICD-10-CM | POA: Diagnosis not present

## 2018-12-27 DIAGNOSIS — R262 Difficulty in walking, not elsewhere classified: Secondary | ICD-10-CM

## 2018-12-27 NOTE — Therapy (Signed)
[place], [name], [address] Phone: [phone]   Fax:  [phone]  Physical Therapy Treatment  Patient Details  Name: [name] MRN: [id number] Date of Birth: [birth year] Referring Provider (PT): Vickey Sages Date: 12/27/2018  PT End of Session - 12/27/18 1507    Visit Number  2    Number of Visits  25    Date for PT Re-Evaluation  02/22/19    PT Start Time  1430    PT Stop Time  1510    PT Time Calculation (min)  40 min    Activity Tolerance  Patient tolerated treatment well    Behavior During Therapy  [facility] for tasks assessed/performed       Past Medical History:  Diagnosis Date  . Anemia    ESRD  . CHF (congestive heart failure) ([place])   . Diabetes mellitus without complication ([place])   . ESRD on hemodialysis ([place]) 06/07/2011   TTS [employer]. Started HD in 2006, got transplant in 2014 lasted until Dec 2019 then went back on HD.  Has L thigh AVG as of Jun 2020.  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   Marland Kitchen GERD (gastroesophageal reflux disease)   . Hypertension 111/29/2012   pt states he takes no HTN meds  . Hypothyroidism, secondary   . Morbid obesity ([place])   . Paroxysmal atrial fibrillation (HCC)   . Sleep apnea 06/07/2011   Pt had sllep study done 2 weeks ago- Dr. Joellyn Quails.  Does not  have a CPAP at this time.    Past Surgical History:  Procedure Laterality Date  . ARTERIOVENOUS GRAFT PLACEMENT  08/09/10   Left Thigh Graft by Dr. [name]  . AV FISTULA PLACEMENT Right 05/18/2018   Procedure: INSERTION OF ARTERIOVENOUS (AV) GORE-TEX GRAFT Left THIGH;  Surgeon: Waynetta Sandy, MD;  Location: [facility];  Service: Vascular;  Laterality: Right;  . CAPD REMOVAL  05/08/2011   Procedure: CONTINUOUS AMBULATORY PERITONEAL DIALYSIS  (CAPD) CATHETER REMOVAL;  Surgeon: [name], MD;  [facility];  Service: General;  Laterality: N/A;  Removal  of CAPD catheter, Dr. requests to go after 100  . INSERTION OF DIALYSIS CATHETER  10/05/10   Right Femoral Cath insertion by Dr. [name].  Pt ahas had several caths inserted.  . IR FLUORO GUIDE CV LINE RIGHT  05/11/2018  . IR US GUIDE VASC ACCESS RIGHT  05/11/2018  . KIDNEY TRANSPLANT  2014  . UPPER EXTREMITY ANGIOGRAPHY Bilateral 05/13/2018   Procedure: UPPER EXTREMITY ANGIOGRAPHY - bilarteral;  Surgeon: Marty Heck, MD;  Location: [facility];  Service: Cardiovascular;  Laterality: Bilateral;    There were no vitals filed for this visit.  Subjective Assessment - 12/27/18 1434    Subjective  "Im a whole lot better, feels awkward when I walk, I couldn't stand up a few weeks ago"    Patient Stated Goals  would like to be able to get back to work, be able to be strong again    Currently in Pain?  No/denies                       [facility] Adult PT Treatment/Exercise - 12/27/18 0001      Exercises   Exercises  Lumbar;Knee/Hip      Lumbar Exercises: Aerobic   Nustep  L4 x 6 min  Lumbar Exercises: Standing   Shoulder Extension  Theraband;20 reps;Both;Strengthening    Theraband Level (Shoulder Extension)  Level 2 (Red)      Lumbar Exercises: Seated   Sit to Stand  5 reps   x 2 Airex on mat table    Other Seated Lumbar Exercises  Tband Rows Green 2x15      Knee/Hip Exercises: Seated   Long Arc Quad  Strengthening;Both;2 sets;10 reps;Weights    Long Arc Quad Weight  3 lbs.    Ball Squeeze  x10 3 sec hold      Marching  Strengthening;1 set;10 reps    Marching Weights  3 lbs.    Hamstring Curl  Strengthening;Both;3 sets;10 reps    Hamstring Limitations  green                   PT Long Term Goals - 12/22/18 1752      PT LONG TERM GOAL #1   Title  increase shoulder ROM to 140 degrees flexion    Time  8    Period  Weeks    Status  New      PT LONG TERM GOAL #2   Title  increase LE strength to 4+/5    Time  8    Period  Weeks     Status  New      PT LONG TERM GOAL #3   Title  walk .75 miles    Time  8    Period  Weeks    Status  New      PT LONG TERM GOAL #4   Title  decrease TUG time to 11 seconds    Time  8    Period  Weeks    Status  New            Plan - 12/27/18 1507    Clinical Impression Statement  Pt tolerated an initial progression to TE well. No reports of pain only fatigue. Postural cues needed throughout treatment. Cues needed to engage core with all exercises. Ft fatigue with little activity requiring frequent rest.    Stability/Clinical Decision Making  Evolving/Moderate complexity    Rehab Potential  Good    PT Frequency  2x / week    PT Treatment/Interventions  ADLs/Self Care Home Management;Gait training;Stair training;Functional mobility training;Neuromuscular re-education;Balance training;Therapeutic exercise;Therapeutic activities;Patient/family education;Manual techniques    PT Next Visit Plan  start exercises, allow rest as needed, work on strength , careful with shoulders       Patient will benefit from skilled therapeutic intervention in order to improve the following deficits and impairments:  Postural dysfunction, Cardiopulmonary status limiting activity, Decreased activity tolerance, Decreased endurance, Decreased range of motion, Decreased strength, Impaired UE functional use, Difficulty walking, Decreased balance  Visit Diagnosis: 1. Difficulty in walking, not elsewhere classified   2. Muscle weakness (generalized)        Problem List Patient Active Problem List   Diagnosis Date Noted  . Labile blood glucose   . Anemia of chronic disease   . Debility 12/13/2018  . Diabetes mellitus type 2 in obese ([place])   . Medically noncompliant   . Pressure injury of skin 12/12/2018  . Sepsis ([place]) 11/30/2018  . Acute respiratory failure with hypoxia ([place]) 11/30/2018  . Acute encephalopathy   . Septic shock ([place])   . Hematuria 11/03/2018  . Genital warts 11/03/2018  .  Meralgia paresthetica of right side 08/13/2018  . ESRD on dialysis ([place]) 06/04/2018  . Acute  kidney injury superimposed on CKD ([place]) 05/10/2018  . Acidosis 05/10/2018  . Anemia 05/10/2018  . Acute renal failure superimposed on stage 5 chronic kidney disease, not on chronic dialysis ([place])   . Type 2 diabetes mellitus without complication, without long-term current use of insulin ([place]) 01/25/2018  . Essential hypertension 01/25/2017  . Well adult exam 01/17/2016  . Morbid obesity ([place]) 01/17/2016  . Delayed graft function of kidney transplant due to reason other than ATN or rejection requiring acute dialysis ([place]) 11/02/2012  . Immunosuppression ([place]) 11/02/2012  . Kidney replaced by transplant 11/02/2012  . OSA (obstructive sleep apnea) 05/26/2011    Scot Jun, PTA 12/27/2018, 3:12 PM  [place], [name], [address] Phone: [phone]   Fax:  [phone]  Name: [name] MRN: [id number] Date of Birth: 07-30-1975

## 2018-12-29 ENCOUNTER — Encounter: Payer: Self-pay | Admitting: Physical Therapy

## 2018-12-29 ENCOUNTER — Other Ambulatory Visit: Payer: Self-pay

## 2018-12-29 ENCOUNTER — Ambulatory Visit: Payer: 59 | Admitting: Physical Therapy

## 2018-12-29 DIAGNOSIS — M6281 Muscle weakness (generalized): Secondary | ICD-10-CM | POA: Diagnosis not present

## 2018-12-29 DIAGNOSIS — R262 Difficulty in walking, not elsewhere classified: Secondary | ICD-10-CM

## 2018-12-29 NOTE — Therapy (Signed)
[place], [name], [address] Phone: [phone]   Fax:  [phone]  Physical Therapy Treatment  Patient Details  Name: [name] MRN: [id number] Date of Birth: 05-28-1976 Referring Provider (PT): Vickey Sages Date: 12/29/2018  PT End of Session - 12/29/18 1512    Visit Number  3    Number of Visits  25    Date for PT Re-Evaluation  02/22/19    PT Start Time  1430    Activity Tolerance  Patient tolerated treatment well    Behavior During Therapy  [facility] for tasks assessed/performed       Past Medical History:  Diagnosis Date  . Anemia    ESRD  . CHF (congestive heart failure) ([place])   . Diabetes mellitus without complication ([place])   . ESRD on hemodialysis ([place]) 06/07/2011   TTS [employer]. Started HD in 2006, got transplant in 2014 lasted until Dec 2019 then went back on HD.  Has L thigh AVG as of Jun 2020.  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   Marland Kitchen GERD (gastroesophageal reflux disease)   . Hypertension 111/29/2012   pt states he takes no HTN meds  . Hypothyroidism, secondary   . Morbid obesity ([place])   . Paroxysmal atrial fibrillation (H[facility])   . Sleep apnea 06/07/2011   Pt had sllep study done 2 weeks ago- Dr. Joellyn Quails.  Does not  have a CPAP at this time.    Past Surgical History:  Procedure Laterality Date  . ARTERIOVENOUS GRAFT PLACEMENT  08/09/10   Left Thigh Graft by Dr. [name]  . AV FISTULA PLACEMENT Right 05/18/2018   Procedure: INSERTION OF ARTERIOVENOUS (AV) GORE-TEX GRAFT Left THIGH;  Surgeon: Waynetta Sandy, MD;  Location: [facility];  Service: Vascular;  Laterality: Right;  . CAPD REMOVAL  05/08/2011   Procedure: CONTINUOUS AMBULATORY PERITONEAL DIALYSIS  (CAPD) CATHETER REMOVAL;  Surgeon: [name], MD;  [facility];  Service: General;  Laterality: N/A;  Removal of CAPD catheter, Dr. requests to go after 100  . INSERTION  OF DIALYSIS CATHETER  10/05/10   Right Femoral Cath insertion by Dr. [name].  Pt ahas had several caths inserted.  . IR FLUORO GUIDE CV LINE RIGHT  05/11/2018  . IR US GUIDE VASC ACCESS RIGHT  05/11/2018  . KIDNEY TRANSPLANT  2014  . UPPER EXTREMITY ANGIOGRAPHY Bilateral 05/13/2018   Procedure: UPPER EXTREMITY ANGIOGRAPHY - bilarteral;  Surgeon: Marty Heck, MD;  Location: [facility];  Service: Cardiovascular;  Laterality: Bilateral;    There were no vitals filed for this visit.  Subjective Assessment - 12/29/18 1433    Subjective  "I am pretty good" No pain or soreness after last session    How long can you stand comfortably?  4-5 minutes    Patient Stated Goals  would like to be able to get back to work, be able to be strong again    Currently in Pain?  No/denies    Pain Score  0-No pain                       [facility] Adult PT Treatment/Exercise - 12/29/18 0001      Lumbar Exercises: Aerobic   Nustep  L5 x 6 min       Lumbar Exercises: Machines for Strengthening   Other Lumbar Machine Exercise  Rows 25lb 2x10  Other Lumbar Machine Exercise  Lats 45lb 2x10      Lumbar Exercises: Standing   Row  Theraband;15 reps;Both   x2   Theraband Level (Row)  Level 3 (Green)    Shoulder Extension  Theraband;20 reps;Both;Strengthening    Theraband Level (Shoulder Extension)  Level 3 (Green)    Other Standing Lumbar Exercises  Shoulder ER AROM 2x10     Other Standing Lumbar Exercises  Shoulder flex x5, Abd x5       Lumbar Exercises: Seated   Sit to Stand  10 reps      Knee/Hip Exercises: Aerobic   Recumbent Bike  L1 x4 min       Knee/Hip Exercises: Machines for Strengthening   Cybex Knee Extension  10lb 2x10     Cybex Knee Flexion  35lb 2x10      Knee/Hip Exercises: Standing   Heel Raises  Both;2 sets;10 reps;2 seconds      Knee/Hip Exercises: Seated   Sit to Sand  2 sets;10 reps;without UE support   Airex on mat table holding yellow ball                   PT Long Term Goals - 12/29/18 1512      PT LONG TERM GOAL #1   Title  increase shoulder ROM to 140 degrees flexion    Status  On-going      PT LONG TERM GOAL #2   Title  increase LE strength to 4+/5    Status  On-going      PT LONG TERM GOAL #3   Title  walk .75 miles      PT LONG TERM GOAL #4   Status  On-going            Plan - 12/29/18 1513    Clinical Impression Statement  Pt did well with a progressed therapy session. No reports of pain only fatigue with activity. Cues needed with sit to stands to keep LE even. Bilateral shoulder elevation with both flex and abduction. Good effort with all interventions.    Stability/Clinical Decision Making  Evolving/Moderate complexity    Rehab Potential  Good    PT Frequency  2x / week    PT Duration  8 weeks    PT Treatment/Interventions  ADLs/Self Care Home Management;Gait training;Stair training;Functional mobility training;Neuromuscular re-education;Balance training;Therapeutic exercise;Therapeutic activities;Patient/family education;Manual techniques    PT Next Visit Plan  allow rest as needed, work on strength , careful with shoulders       Patient will benefit from skilled therapeutic intervention in order to improve the following deficits and impairments:  Postural dysfunction, Cardiopulmonary status limiting activity, Decreased activity tolerance, Decreased endurance, Decreased range of motion, Decreased strength, Impaired UE functional use, Difficulty walking, Decreased balance  Visit Diagnosis: 1. Difficulty in walking, not elsewhere classified   2. Muscle weakness (generalized)        Problem List Patient Active Problem List   Diagnosis Date Noted  . Labile blood glucose   . Anemia of chronic disease   . Debility 12/13/2018  . Diabetes mellitus type 2 in obese ([place])   . Medically noncompliant   . Pressure injury of skin 12/12/2018  . Sepsis ([place]) 11/30/2018  . Acute respiratory  failure with hypoxia ([place]) 11/30/2018  . Acute encephalopathy   . Septic shock ([place])   . Hematuria 11/03/2018  . Genital warts 11/03/2018  . Meralgia paresthetica of right side 08/13/2018  . ESRD on dialysis ([place]) 06/04/2018  .  Acute kidney injury superimposed on CKD ([place]) 05/10/2018  . Acidosis 05/10/2018  . Anemia 05/10/2018  . Acute renal failure superimposed on stage 5 chronic kidney disease, not on chronic dialysis ([place])   . Type 2 diabetes mellitus without complication, without long-term current use of insulin ([place]) 01/25/2018  . Essential hypertension 01/25/2017  . Well adult exam 01/17/2016  . Morbid obesity ([place]) 01/17/2016  . Delayed graft function of kidney transplant due to reason other than ATN or rejection requiring acute dialysis ([place]) 11/02/2012  . Immunosuppression ([place]) 11/02/2012  . Kidney replaced by transplant 11/02/2012  . OSA (obstructive sleep apnea) 05/26/2011    Scot Jun, PTA 12/29/2018, 3:16 PM  [place], [name], [address] Phone: [phone]   Fax:  [phone]  Name: [name] MRN: [id number] Date of Birth: [birth year]

## 2019-01-05 ENCOUNTER — Other Ambulatory Visit: Payer: Self-pay

## 2019-01-05 ENCOUNTER — Encounter: Payer: Self-pay | Admitting: Physical Therapy

## 2019-01-05 ENCOUNTER — Ambulatory Visit: Payer: 59 | Admitting: Physical Therapy

## 2019-01-05 DIAGNOSIS — M6281 Muscle weakness (generalized): Secondary | ICD-10-CM | POA: Diagnosis not present

## 2019-01-05 DIAGNOSIS — R262 Difficulty in walking, not elsewhere classified: Secondary | ICD-10-CM

## 2019-01-05 NOTE — Therapy (Signed)
[place], [name], [address] Phone: [phone]   Fax:  [phone]  Physical Therapy Treatment  Patient Details  Name: [name] MRN: [id number] Date of Birth: 20-Dec-1975 Referring Provider (PT): Vickey Sages Date: 01/05/2019  PT End of Session - 01/05/19 1343    Visit Number  4    Number of Visits  25    Date for PT Re-Evaluation  02/22/19    PT Start Time  1300    PT Stop Time  1343    PT Time Calculation (min)  43 min       Past Medical History:  Diagnosis Date  . Anemia    ESRD  . CHF (congestive heart failure) ([place])   . Diabetes mellitus without complication ([place])   . ESRD on hemodialysis ([place]) 06/07/2011   TTS [employer]. Started HD in 2006, got transplant in 2014 lasted until Dec 2019 then went back on HD.  Has L thigh AVG as of [name] 2020.  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   Marland Kitchen GERD (gastroesophageal reflux disease)   . Hypertension 111/29/2012   pt states he takes no HTN meds  . Hypothyroidism, secondary   . Morbid obesity ([place])   . Paroxysmal atrial fibrillation (H[facility])   . Sleep apnea 06/07/2011   Pt had sllep study done 2 weeks ago- Dr. Joellyn Quails.  Does not  have a CPAP at this time.    Past Surgical History:  Procedure Laterality Date  . ARTERIOVENOUS GRAFT PLACEMENT  08/09/10   Left Thigh Graft by Dr. [name]  . AV FISTULA PLACEMENT Right 05/18/2018   Procedure: INSERTION OF ARTERIOVENOUS (AV) GORE-TEX GRAFT Left THIGH;  Surgeon: Waynetta Sandy, MD;  Location: [facility];  Service: Vascular;  Laterality: Right;  . CAPD REMOVAL  05/08/2011   Procedure: CONTINUOUS AMBULATORY PERITONEAL DIALYSIS  (CAPD) CATHETER REMOVAL;  Surgeon: [name], MD;  [facility];  Service: General;  Laterality: N/A;  Removal of CAPD catheter, Dr. requests to go after 100  . INSERTION OF DIALYSIS CATHETER  10/05/10   Right Femoral Cath  insertion by Dr. [name].  Pt ahas had several caths inserted.  . IR FLUORO GUIDE CV LINE RIGHT  05/11/2018  . IR US GUIDE VASC ACCESS RIGHT  05/11/2018  . KIDNEY TRANSPLANT  2014  . UPPER EXTREMITY ANGIOGRAPHY Bilateral 05/13/2018   Procedure: UPPER EXTREMITY ANGIOGRAPHY - bilarteral;  Surgeon: Marty Heck, MD;  Location: [facility];  Service: Cardiovascular;  Laterality: Bilateral;    There were no vitals filed for this visit.  Subjective Assessment - 01/05/19 1258    Subjective  "Pretty good"    Limitations  Walking    How long can you stand comfortably?  4-5 minutes    Patient Stated Goals  would like to be able to get back to work, be able to be strong again    Currently in Pain?  No/denies         [facility] PT Assessment - 01/05/19 0001      AROM   Right Shoulder Flexion  74 Degrees    Right Shoulder ABduction  50 Degrees    Left Shoulder Flexion  174 Degrees    Left Shoulder ABduction  142 Degrees                   [facility] Adult PT Treatment/Exercise - 01/05/19 0001  Lumbar Exercises: Aerobic   Nustep  L5 x 6 min       Lumbar Exercises: Machines for Strengthening   Leg Press  40lb 2x15     Other Lumbar Machine Exercise  Rows 35lb 2x10; Chest press20lb 2x10     Other Lumbar Machine Exercise  Lats 45lb 2x10      Lumbar Exercises: Standing   Row  Theraband;20 reps;Both    Theraband Level (Row)  Level 4 (Blue)    Shoulder Extension  Theraband;20 reps;Both;Strengthening    Theraband Level (Shoulder Extension)  Level 4 (Blue)      Lumbar Exercises: Supine   Other Supine Lumbar Exercises  RUE flex      Knee/Hip Exercises: Machines for Strengthening   Cybex Knee Extension  10lb x15, SL 5lb 2x10 each    Cybex Knee Flexion  35lb 2x15      Knee/Hip Exercises: Standing   Heel Raises  Both;2 sets;2 seconds;15 reps    Forward Step Up  Both;1 set;10 reps;Hand Hold: 0      Manual Therapy   Manual Therapy  Passive ROM    Passive ROM  R  shoulder ROM.                   PT Long Term Goals - 12/29/18 1512      PT LONG TERM GOAL #1   Title  increase shoulder ROM to 140 degrees flexion    Status  On-going      PT LONG TERM GOAL #2   Title  increase LE strength to 4+/5    Status  On-going      PT LONG TERM GOAL #3   Title  walk .75 miles      PT LONG TERM GOAL #4   Status  On-going            Plan - 01/05/19 1344    Clinical Impression Statement  Pt has improved with L shoulder AROM. R shoulder motion is very limited. No issues with a progression in today's exercises. Pt reports a past injury that could be a limiting factor in his R shoulder motion. Pt has full R shoulder PROM, but reports a pinch when transitioning from flexion to scaption. Pt did well with all LE exercises.    Stability/Clinical Decision Making  Evolving/Moderate complexity    PT Duration  8 weeks    PT Treatment/Interventions  ADLs/Self Care Home Management;Gait training;Stair training;Functional mobility training;Neuromuscular re-education;Balance training;Therapeutic exercise;Therapeutic activities;Patient/family education;Manual techniques    PT Next Visit Plan  allow rest as needed, work on strength , careful with shoulders       Patient will benefit from skilled therapeutic intervention in order to improve the following deficits and impairments:  Postural dysfunction, Cardiopulmonary status limiting activity, Decreased activity tolerance, Decreased endurance, Decreased range of motion, Decreased strength, Impaired UE functional use, Difficulty walking, Decreased balance  Visit Diagnosis: 1. Muscle weakness (generalized)   2. Difficulty in walking, not elsewhere classified        Problem List Patient Active Problem List   Diagnosis Date Noted  . Labile blood glucose   . Anemia of chronic disease   . Debility 12/13/2018  . Diabetes mellitus type 2 in obese ([place])   . Medically noncompliant   . Pressure injury of skin  12/12/2018  . Sepsis ([place]) 11/30/2018  . Acute respiratory failure with hypoxia ([place]) 11/30/2018  . Acute encephalopathy   . Septic shock ([place])   . Hematuria 11/03/2018  .  Genital warts 11/03/2018  . Meralgia paresthetica of right side 08/13/2018  . ESRD on dialysis ([place]) 06/04/2018  . Acute kidney injury superimposed on CKD ([place]) 05/10/2018  . Acidosis 05/10/2018  . Anemia 05/10/2018  . Acute renal failure superimposed on stage 5 chronic kidney disease, not on chronic dialysis ([place])   . Type 2 diabetes mellitus without complication, without long-term current use of insulin ([place]) 01/25/2018  . Essential hypertension 01/25/2017  . Well adult exam 01/17/2016  . Morbid obesity ([place]) 01/17/2016  . Delayed graft function of kidney transplant due to reason other than ATN or rejection requiring acute dialysis ([place]) 11/02/2012  . Immunosuppression ([place]) 11/02/2012  . Kidney replaced by transplant 11/02/2012  . OSA (obstructive sleep apnea) 05/26/2011    [name], PTA 01/05/2019, 1:46 PM  [place], [name], [address] Phone: [phone]   Fax:  [phone]  Name: TAESEAN RETH MRN: [id number] Date of Birth: 1976-05-01

## 2019-01-07 ENCOUNTER — Ambulatory Visit: Payer: 59 | Admitting: Physical Therapy

## 2019-01-07 ENCOUNTER — Other Ambulatory Visit: Payer: Self-pay

## 2019-01-07 ENCOUNTER — Encounter: Payer: Self-pay | Admitting: Physical Therapy

## 2019-01-07 DIAGNOSIS — R262 Difficulty in walking, not elsewhere classified: Secondary | ICD-10-CM

## 2019-01-07 DIAGNOSIS — M6281 Muscle weakness (generalized): Secondary | ICD-10-CM | POA: Diagnosis not present

## 2019-01-07 NOTE — Therapy (Signed)
[place], [name], [address] Phone: [phone]   Fax:  [phone]  Physical Therapy Treatment  Patient Details  Name: [name] MRN: [id number] Date of Birth: Oct 30, 1975 Referring Provider (PT): Vickey Sages Date: 01/07/2019  PT End of Session - 01/07/19 1147    Visit Number  5    Date for PT Re-Evaluation  02/22/19    PT Start Time  1100    PT Stop Time  1145    PT Time Calculation (min)  45 min    Activity Tolerance  Patient tolerated treatment well    Behavior During Therapy  [facility] for tasks assessed/performed       Past Medical History:  Diagnosis Date  . Anemia    ESRD  . CHF (congestive heart failure) ([place])   . Diabetes mellitus without complication ([place])   . ESRD on hemodialysis ([place]) 06/07/2011   TTS [employer]. Started HD in 2006, got transplant in 2014 lasted until Dec 2019 then went back on HD.  Has L thigh AVG as of Jun 2020.  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   Marland Kitchen GERD (gastroesophageal reflux disease)   . Hypertension 111/29/2012   pt states he takes no HTN meds  . Hypothyroidism, secondary   . Morbid obesity ([place])   . Paroxysmal atrial fibrillation (HCC)   . Sleep apnea 06/07/2011   Pt had sllep study done 2 weeks ago- Dr. Joellyn Quails.  Does not  have a CPAP at this time.    Past Surgical History:  Procedure Laterality Date  . ARTERIOVENOUS GRAFT PLACEMENT  08/09/10   Left Thigh Graft by Dr. [name]  . AV FISTULA PLACEMENT Right 05/18/2018   Procedure: INSERTION OF ARTERIOVENOUS (AV) GORE-TEX GRAFT Left THIGH;  Surgeon: Waynetta Sandy, MD;  Location: [facility];  Service: Vascular;  Laterality: Right;  . CAPD REMOVAL  05/08/2011   Procedure: CONTINUOUS AMBULATORY PERITONEAL DIALYSIS  (CAPD) CATHETER REMOVAL;  Surgeon: [name], MD;  [facility];  Service: General;  Laterality: N/A;  Removal of CAPD catheter, Dr.  requests to go after 100  . INSERTION OF DIALYSIS CATHETER  10/05/10   Right Femoral Cath insertion by Dr. [name].  Pt ahas had several caths inserted.  . IR FLUORO GUIDE CV LINE RIGHT  05/11/2018  . IR US GUIDE VASC ACCESS RIGHT  05/11/2018  . KIDNEY TRANSPLANT  2014  . UPPER EXTREMITY ANGIOGRAPHY Bilateral 05/13/2018   Procedure: UPPER EXTREMITY ANGIOGRAPHY - bilarteral;  Surgeon: Marty Heck, MD;  Location: [facility];  Service: Cardiovascular;  Laterality: Bilateral;    There were no vitals filed for this visit.  Subjective Assessment - 01/07/19 1104    Subjective  "Good"    Currently in Pain?  No/denies                       [facility] Adult PT Treatment/Exercise - 01/07/19 0001      Ambulation/Gait   Stairs  Yes    Stairs Assistance  6: Modified independent (Device/Increase time)    Stair Management Technique  No rails;One rail Right;Alternating pattern    Number of Stairs  48    Height of Stairs  6    Gait Comments  2 flights      Lumbar Exercises: Aerobic   Elliptical  I5 R3 x2 min     Nustep  L6 x 6 min       Lumbar Exercises: Machines for Strengthening   Leg Press  50lb 2x10, SL 30llb 2x10     Other Lumbar Machine Exercise  Rows 45lb 3x10; Chest press 20lb 3x10     Other Lumbar Machine Exercise  Lats 45lb 2x10      Lumbar Exercises: Standing   Shoulder Extension  Theraband;20 reps;Both;Strengthening    Shoulder Extension Limitations  10      Knee/Hip Exercises: Machines for Strengthening   Cybex Knee Extension  10lb x15, SL 5lb 2x10 each    Cybex Knee Flexion  35lb 2x15                  PT Long Term Goals - 12/29/18 1512      PT LONG TERM GOAL #1   Title  increase shoulder ROM to 140 degrees flexion    Status  On-going      PT LONG TERM GOAL #2   Title  increase LE strength to 4+/5    Status  On-going      PT LONG TERM GOAL #3   Title  walk .75 miles      PT LONG TERM GOAL #4   Status  On-going             Plan - 01/07/19 1147    Clinical Impression Statement  Pt was able to tolerated increase load and or rep with machine interventions. He progressed to stair negotiation both with and without rail maintain an alternating pattern. Stair negotiation really fatigued patient. Tactile postural cues needed with seated rows and standing shoulder extensions.    Stability/Clinical Decision Making  Evolving/Moderate complexity    Rehab Potential  Good    PT Frequency  2x / week    PT Duration  8 weeks    PT Treatment/Interventions  ADLs/Self Care Home Management;Gait training;Stair training;Functional mobility training;Neuromuscular re-education;Balance training;Therapeutic exercise;Therapeutic activities;Patient/family education;Manual techniques    PT Next Visit Plan  allow rest as needed, work on strength , careful with shoulders       Patient will benefit from skilled therapeutic intervention in order to improve the following deficits and impairments:  Postural dysfunction, Cardiopulmonary status limiting activity, Decreased activity tolerance, Decreased endurance, Decreased range of motion, Decreased strength, Impaired UE functional use, Difficulty walking, Decreased balance  Visit Diagnosis: 1. Muscle weakness (generalized)   2. Difficulty in walking, not elsewhere classified        Problem List Patient Active Problem List   Diagnosis Date Noted  . Labile blood glucose   . Anemia of chronic disease   . Debility 12/13/2018  . Diabetes mellitus type 2 in obese ([place])   . Medically noncompliant   . Pressure injury of skin 12/12/2018  . Sepsis ([place]) 11/30/2018  . Acute respiratory failure with hypoxia ([place]) 11/30/2018  . Acute encephalopathy   . Septic shock ([place])   . Hematuria 11/03/2018  . Genital warts 11/03/2018  . Meralgia paresthetica of right side 08/13/2018  . ESRD on dialysis ([place]) 06/04/2018  . Acute kidney injury superimposed on CKD ([place]) 05/10/2018  . Acidosis  05/10/2018  . Anemia 05/10/2018  . Acute renal failure superimposed on stage 5 chronic kidney disease, not on chronic dialysis ([place])   . Type 2 diabetes mellitus without complication, without long-term current use of insulin ([place]) 01/25/2018  . Essential hypertension 01/25/2017  . Well adult exam 01/17/2016  . Morbid obesity ([place]) 01/17/2016  . Delayed graft function of kidney  transplant due to reason other than ATN or rejection requiring acute dialysis ([place]) 11/02/2012  . Immunosuppression ([place]) 11/02/2012  . Kidney replaced by transplant 11/02/2012  . OSA (obstructive sleep apnea) 05/26/2011    Scot Jun, PTA 01/07/2019, 11:50 AM  [place], [name], [address] Phone: [phone]   Fax:  [phone]  Name: [name] MRN: [id number] Date of Birth: 11-Oct-1975

## 2019-01-10 ENCOUNTER — Ambulatory Visit: Payer: 59 | Attending: Physical Medicine and Rehabilitation | Admitting: Physical Therapy

## 2019-01-10 ENCOUNTER — Other Ambulatory Visit: Payer: Self-pay

## 2019-01-10 ENCOUNTER — Encounter: Payer: Self-pay | Admitting: Physical Therapy

## 2019-01-10 DIAGNOSIS — M6281 Muscle weakness (generalized): Secondary | ICD-10-CM | POA: Diagnosis present

## 2019-01-10 DIAGNOSIS — R262 Difficulty in walking, not elsewhere classified: Secondary | ICD-10-CM | POA: Diagnosis present

## 2019-01-10 NOTE — Therapy (Signed)
[place], [name], [address] Phone: [phone]   Fax:  [phone]  Physical Therapy Treatment  Patient Details  Name: [name] MRN: [id number] Date of Birth: 05/06/1976 Referring Provider (PT): Vickey Sages Date: 01/10/2019  PT End of Session - 01/10/19 8250    Visit Number  6    Date for PT Re-Evaluation  02/22/19    PT Start Time  1430    PT Stop Time  1515    PT Time Calculation (min)  45 min    Activity Tolerance  Patient tolerated treatment well    Behavior During Therapy  [facility] for tasks assessed/performed       Past Medical History:  Diagnosis Date  . Anemia    ESRD  . CHF (congestive heart failure) ([place])   . Diabetes mellitus without complication ([employer])   . ESRD on hemodialysis ([place]) 06/07/2011   TTS [employer]. Started HD in 2006, got transplant in 2014 lasted until Dec 2019 then went back on HD.  Has L thigh AVG as of Jun 2020.  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   Marland Kitchen GERD (gastroesophageal reflux disease)   . Hypertension 111/29/2012   pt states he takes no HTN meds  . Hypothyroidism, secondary   . Morbid obesity ([place])   . Paroxysmal atrial fibrillation (HCC)   . Sleep apnea 06/07/2011   Pt had sllep study done 2 weeks ago- Dr. Joellyn Quails.  Does not  have a CPAP at this time.    Past Surgical History:  Procedure Laterality Date  . ARTERIOVENOUS GRAFT PLACEMENT  08/09/10   Left Thigh Graft by Dr. [name]  . AV FISTULA PLACEMENT Right 05/18/2018   Procedure: INSERTION OF ARTERIOVENOUS (AV) GORE-TEX GRAFT Left THIGH;  Surgeon: Waynetta Sandy, MD;  Location: [facility];  Service: Vascular;  Laterality: Right;  . CAPD REMOVAL  05/08/2011   Procedure: CONTINUOUS AMBULATORY PERITONEAL DIALYSIS  (CAPD) CATHETER REMOVAL;  Surgeon: [name], MD;  [facility];  Service: General;  Laterality: N/A;  Removal of CAPD catheter, Dr.  requests to go after 100  . INSERTION OF DIALYSIS CATHETER  10/05/10   Right Femoral Cath insertion by Dr. [name].  Pt ahas had several caths inserted.  . IR FLUORO GUIDE CV LINE RIGHT  05/11/2018  . IR US GUIDE VASC ACCESS RIGHT  05/11/2018  . KIDNEY TRANSPLANT  2014  . UPPER EXTREMITY ANGIOGRAPHY Bilateral 05/13/2018   Procedure: UPPER EXTREMITY ANGIOGRAPHY - bilarteral;  Surgeon: Marty Heck, MD;  Location: [facility];  Service: Cardiovascular;  Laterality: Bilateral;    There were no vitals filed for this visit.  Subjective Assessment - 01/10/19 1433    Subjective  "Same old Same old" Pt does report that he is doing better, ADL are getting easier    Currently in Pain?  No/denies                       [facility] Adult PT Treatment/Exercise - 01/10/19 0001      Ambulation/Gait   Stairs  Yes    Stairs Assistance  6: Modified independent (Device/Increase time)    Stair Management Technique  No rails;One rail Right;Alternating pattern    Number of Stairs  72    Height of Stairs  6    Gait Comments  3 flights , standing rest break in between  Lumbar Exercises: Aerobic   Elliptical  I5 R3 x2 min     Nustep  L6 x 6 min       Lumbar Exercises: Machines for Strengthening   Other Lumbar Machine Exercise  Rows 45lb 3x10; Chest press 35lb 2x10     Other Lumbar Machine Exercise  Lats 45lb 3x10      Lumbar Exercises: Standing   Shoulder Extension  Theraband;20 reps;Both;Strengthening    Shoulder Extension Limitations  10      Knee/Hip Exercises: Machines for Strengthening   Cybex Knee Extension  15lb 2x15    Cybex Knee Flexion  45lb 2x15                  PT Long Term Goals - 12/29/18 1512      PT LONG TERM GOAL #1   Title  increase shoulder ROM to 140 degrees flexion    Status  On-going      PT LONG TERM GOAL #2   Title  increase LE strength to 4+/5    Status  On-going      PT LONG TERM GOAL #3   Title  walk .75 miles      PT  LONG TERM GOAL #4   Status  On-going            Plan - 01/10/19 1518    Clinical Impression Statement  Overall pt did well with today's treatment. He did ave increase fatigue throughout. Standing rest break needed between each flight of stairs. Pt did well with the additional sets with seated rows and lats. Increase resistance tolerated with ER interventions.    Stability/Clinical Decision Making  Evolving/Moderate complexity    Rehab Potential  Good    PT Frequency  2x / week    PT Duration  8 weeks    PT Treatment/Interventions  ADLs/Self Care Home Management;Gait training;Stair training;Functional mobility training;Neuromuscular re-education;Balance training;Therapeutic exercise;Therapeutic activities;Patient/family education;Manual techniques    PT Next Visit Plan  allow rest as needed, work on strength , careful with shoulders       Patient will benefit from skilled therapeutic intervention in order to improve the following deficits and impairments:  Postural dysfunction, Cardiopulmonary status limiting activity, Decreased activity tolerance, Decreased endurance, Decreased range of motion, Decreased strength, Impaired UE functional use, Difficulty walking, Decreased balance  Visit Diagnosis: 1. Difficulty in walking, not elsewhere classified   2. Muscle weakness (generalized)        Problem List Patient Active Problem List   Diagnosis Date Noted  . Labile blood glucose   . Anemia of chronic disease   . Debility 12/13/2018  . Diabetes mellitus type 2 in obese ([place])   . Medically noncompliant   . Pressure injury of skin 12/12/2018  . Sepsis ([place]) 11/30/2018  . Acute respiratory failure with hypoxia ([place]) 11/30/2018  . Acute encephalopathy   . Septic shock ([place])   . Hematuria 11/03/2018  . Genital warts 11/03/2018  . Meralgia paresthetica of right side 08/13/2018  . ESRD on dialysis ([place]) 06/04/2018  . Acute kidney injury superimposed on CKD ([place]) 05/10/2018  .  Acidosis 05/10/2018  . Anemia 05/10/2018  . Acute renal failure superimposed on stage 5 chronic kidney disease, not on chronic dialysis ([place])   . Type 2 diabetes mellitus without complication, without long-term current use of insulin ([place]) 01/25/2018  . Essential hypertension 01/25/2017  . Well adult exam 01/17/2016  . Morbid obesity ([place]) 01/17/2016  . Delayed graft function of kidney transplant due  to reason other than ATN or rejection requiring acute dialysis ([place]) 11/02/2012  . Immunosuppression ([place]) 11/02/2012  . Kidney replaced by transplant 11/02/2012  . OSA (obstructive sleep apnea) 05/26/2011    Scot Jun, PTA 01/10/2019, 3:24 PM  [place], [name], [address] Phone: [phone]   Fax:  [phone]  Name: [name] MRN: [id number] Date of Birth: 1976-05-31

## 2019-01-12 ENCOUNTER — Other Ambulatory Visit: Payer: Self-pay

## 2019-01-12 ENCOUNTER — Encounter: Payer: Self-pay | Admitting: Physical Therapy

## 2019-01-12 ENCOUNTER — Ambulatory Visit: Payer: 59 | Admitting: Physical Therapy

## 2019-01-12 DIAGNOSIS — M6281 Muscle weakness (generalized): Secondary | ICD-10-CM

## 2019-01-12 DIAGNOSIS — R262 Difficulty in walking, not elsewhere classified: Secondary | ICD-10-CM | POA: Diagnosis not present

## 2019-01-12 NOTE — Therapy (Signed)
[place], [name], [address] Phone: [phone]   Fax:  [phone]  Physical Therapy Treatment  Patient Details  Name: [name] MRN: [id number] Date of Birth: [birth year] Referring Provider (PT): Vickey Sages Date: 01/12/2019  PT End of Session - 01/12/19 1523    Visit Number  7    Number of Visits  25    Date for PT Re-Evaluation  02/22/19    PT Start Time  1450    PT Stop Time  1530    PT Time Calculation (min)  40 min    Activity Tolerance  Patient tolerated treatment well    Behavior During Therapy  [facility] for tasks assessed/performed       Past Medical History:  Diagnosis Date  . Anemia    ESRD  . CHF (congestive heart failure) ([place])   . Diabetes mellitus without complication ([place])   . ESRD on hemodialysis ([place]) 06/07/2011   TTS [employer]. Started HD in 2006, got transplant in 2014 lasted until Dec 2019 then went back on HD.  Has L thigh AVG as of Jun 2020.  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   Marland Kitchen GERD (gastroesophageal reflux disease)   . Hypertension 111/29/2012   pt states he takes no HTN meds  . Hypothyroidism, secondary   . Morbid obesity ([place])   . Paroxysmal atrial fibrillation (H[facility])   . Sleep apnea 06/07/2011   Pt had sllep study done 2 weeks ago- Dr. Joellyn Quails.  Does not  have a CPAP at this time.    Past Surgical History:  Procedure Laterality Date  . ARTERIOVENOUS GRAFT PLACEMENT  08/09/10   Left Thigh Graft by Dr. [name]  . AV FISTULA PLACEMENT Right 05/18/2018   Procedure: INSERTION OF ARTERIOVENOUS (AV) GORE-TEX GRAFT Left THIGH;  Surgeon: Waynetta Sandy, MD;  Location: [facility];  Service: Vascular;  Laterality: Right;  . CAPD REMOVAL  05/08/2011   Procedure: CONTINUOUS AMBULATORY PERITONEAL DIALYSIS  (CAPD) CATHETER REMOVAL;  Surgeon: [name], MD;  [facility];  Service: General;  Laterality: N/A;  Removal  of CAPD catheter, Dr. requests to go after 100  . INSERTION OF DIALYSIS CATHETER  10/05/10   Right Femoral Cath insertion by Dr. [name].  Pt ahas had several caths inserted.  . IR FLUORO GUIDE CV LINE RIGHT  05/11/2018  . IR US GUIDE VASC ACCESS RIGHT  05/11/2018  . KIDNEY TRANSPLANT  2014  . UPPER EXTREMITY ANGIOGRAPHY Bilateral 05/13/2018   Procedure: UPPER EXTREMITY ANGIOGRAPHY - bilarteral;  Surgeon: Marty Heck, MD;  Location: [facility];  Service: Cardiovascular;  Laterality: Bilateral;    There were no vitals filed for this visit.  Subjective Assessment - 01/12/19 1453    Subjective  I had to take a nap after the last session, I am feeling stronger    Currently in Pain?  No/denies                       [facility] Adult PT Treatment/Exercise - 01/12/19 0001      Lumbar Exercises: Aerobic   Elliptical  I10 R6 x2 min     Nustep  L6 x 6 min     Other Aerobic Exercise  resisted gait all directions      Lumbar Exercises: Machines for Strengthening   Other Lumbar Machine Exercise  Rows 45lb 3x10; Chest  press 35lb 2x10     Other Lumbar Machine Exercise  Lats 45lb 3x10, 20# AR press had to stop AR poress due to shoulder strength and pain      Lumbar Exercises: Seated   Other Seated Lumbar Exercises  physio ball in lap shoulders pressing down for core activation 2x15      Knee/Hip Exercises: Machines for Strengthening   Cybex Knee Extension  15lb 2x15    Cybex Knee Flexion  45lb 2x15                  PT Long Term Goals - 01/12/19 1556      PT LONG TERM GOAL #1   Title  increase shoulder ROM to 140 degrees flexion    Status  On-going      PT LONG TERM GOAL #3   Title  walk .75 miles    Status  On-going            Plan - 01/12/19 1524    Clinical Impression Statement  Patient with some increase of fatigue, he was unable to do the AR press due to the poor shoulder strength, this caused pain, he is tolerating more activities but  still needs rest breaks, his job requires standing all day    PT Next Visit Plan  continue to push endurance and strength as tolerated    Consulted and Agree with Plan of Care  Patient       Patient will benefit from skilled therapeutic intervention in order to improve the following deficits and impairments:  Postural dysfunction, Cardiopulmonary status limiting activity, Decreased activity tolerance, Decreased endurance, Decreased range of motion, Decreased strength, Impaired UE functional use, Difficulty walking, Decreased balance  Visit Diagnosis: 1. Difficulty in walking, not elsewhere classified   2. Muscle weakness (generalized)        Problem List Patient Active Problem List   Diagnosis Date Noted  . Labile blood glucose   . Anemia of chronic disease   . Debility 12/13/2018  . Diabetes mellitus type 2 in obese ([place])   . Medically noncompliant   . Pressure injury of skin 12/12/2018  . Sepsis ([place]) 11/30/2018  . Acute respiratory failure with hypoxia ([place]) 11/30/2018  . Acute encephalopathy   . Septic shock ([place])   . Hematuria 11/03/2018  . Genital warts 11/03/2018  . Meralgia paresthetica of right side 08/13/2018  . ESRD on dialysis ([place] ) 06/04/2018  . Acute kidney injury superimposed on CKD ([place]) 05/10/2018  . Acidosis 05/10/2018  . Anemia 05/10/2018  . Acute renal failure superimposed on stage 5 chronic kidney disease, not on chronic dialysis ([place])   . Type 2 diabetes mellitus without complication, without long-term current use of insulin ([place]) 01/25/2018  . Essential hypertension 01/25/2017  . Well adult exam 01/17/2016  . Morbid obesity ([place]) 01/17/2016  . Delayed graft function of kidney transplant due to reason other than ATN or rejection requiring acute dialysis ([place]) 11/02/2012  . Immunosuppression ([place]) 11/02/2012  . Kidney replaced by transplant 11/02/2012  . OSA (obstructive sleep apnea) 05/26/2011    Sumner Boast., PT 01/12/2019, 3:56 PM  [place], [name], [address] Phone: [phone]   Fax:  [phone]  Name: [name] MRN: [id number] Date of Birth: 1976-02-03

## 2019-01-17 ENCOUNTER — Ambulatory Visit: Payer: 59 | Admitting: Physical Therapy

## 2019-01-19 ENCOUNTER — Ambulatory Visit: Payer: 59 | Admitting: Physical Therapy

## 2019-01-19 ENCOUNTER — Other Ambulatory Visit: Payer: Self-pay

## 2019-01-19 ENCOUNTER — Encounter: Payer: Self-pay | Admitting: Physical Therapy

## 2019-01-19 DIAGNOSIS — M6281 Muscle weakness (generalized): Secondary | ICD-10-CM

## 2019-01-19 DIAGNOSIS — R262 Difficulty in walking, not elsewhere classified: Secondary | ICD-10-CM | POA: Diagnosis not present

## 2019-01-19 NOTE — Therapy (Signed)
[place], [name], [address] Phone: [phone]   Fax:  [phone]  Physical Therapy Treatment  Patient Details  Name: [name] MRN: [id number] Date of Birth: [birth year] Referring Provider (PT): Vickey Sages Date: 01/19/2019  PT End of Session - 01/19/19 1509    Visit Number  8    Date for PT Re-Evaluation  02/22/19    PT Start Time  1432    PT Stop Time  1515    PT Time Calculation (min)  43 min    Activity Tolerance  Patient tolerated treatment well    Behavior During Therapy  [facility] for tasks assessed/performed       Past Medical History:  Diagnosis Date  . Anemia    ESRD  . CHF (congestive heart failure) ([place])   . Diabetes mellitus without complication ([place])   . ESRD on hemodialysis ([place]) 06/07/2011   TTS [employer]. Started HD in 2006, got transplant in 2014 lasted until Dec 2019 then went back on HD.  Has L thigh AVG as of Jun 2020.  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   Marland Kitchen GERD (gastroesophageal reflux disease)   . Hypertension 111/29/2012   pt states he takes no HTN meds  . Hypothyroidism, secondary   . Morbid obesity ([place])   . Paroxysmal atrial fibrillation (HCC)   . Sleep apnea 06/07/2011   Pt had sllep study done 2 weeks ago- Dr. Joellyn Quails.  Does not  have a CPAP at this time.    Past Surgical History:  Procedure Laterality Date  . ARTERIOVENOUS GRAFT PLACEMENT  08/09/10   Left Thigh Graft by Dr. [name]  . AV FISTULA PLACEMENT Right 05/18/2018   Procedure: INSERTION OF ARTERIOVENOUS (AV) GORE-TEX GRAFT Left THIGH;  Surgeon: Waynetta Sandy, MD;  Location: [place];  Service: Vascular;  Laterality: Right;  . CAPD REMOVAL  05/08/2011   Procedure: CONTINUOUS AMBULATORY PERITONEAL DIALYSIS  (CAPD) CATHETER REMOVAL;  Surgeon: [name], MD;  [facility];  Service: General;  Laterality: N/A;  Removal of CAPD catheter, Dr.  requests to go after 100  . INSERTION OF DIALYSIS CATHETER  10/05/10   Right Femoral Cath insertion by Dr. [name].  Pt ahas had several caths inserted.  . IR FLUORO GUIDE CV LINE RIGHT  05/11/2018  . IR US GUIDE VASC ACCESS RIGHT  05/11/2018  . KIDNEY TRANSPLANT  2014  . UPPER EXTREMITY ANGIOGRAPHY Bilateral 05/13/2018   Procedure: UPPER EXTREMITY ANGIOGRAPHY - bilarteral;  Surgeon: Marty Heck, MD;  Location: [place] [facility];  Service: Cardiovascular;  Laterality: Bilateral;    There were no vitals filed for this visit.  Subjective Assessment - 01/19/19 1437    Subjective  Overall pt reports that he is feeling a little better, but his shoulder is the same    Currently in Pain?  No/denies                       [facility] Adult PT Treatment/Exercise - 01/19/19 0001      Ambulation/Gait   Stairs  Yes    Stairs Assistance  6: Modified independent (Device/Increase time)    Stair Management Technique  No rails;One rail Right;Alternating pattern    Number of Stairs  72    Height of Stairs  6    Gait Comments  3 flights , standing rest break in between  Exercises   Exercises  Shoulder      Lumbar Exercises: Aerobic   Elliptical  I10 R6 x2 min     Nustep  L6 x 6 min       Lumbar Exercises: Machines for Strengthening   Other Lumbar Machine Exercise  Rows 55lb 3x10; Chest press 35lb 2x10     Other Lumbar Machine Exercise  Lats 45lb 3x10      Knee/Hip Exercises: Machines for Strengthening   Cybex Knee Extension  15lb 2x15    Cybex Knee Flexion  55lb 2x10      Shoulder Exercises: Standing   External Rotation  Theraband;20 reps;Right    Theraband Level (Shoulder External Rotation)  Level 2 (Red)    Internal Rotation  Theraband;20 reps;Right;Strengthening    Theraband Level (Shoulder Internal Rotation)  Level 2 (Red)                  PT Long Term Goals - 01/12/19 1556      PT LONG TERM GOAL #1   Title  increase shoulder ROM to 140 degrees  flexion    Status  On-going      PT LONG TERM GOAL #3   Title  walk .75 miles    Status  On-going            Plan - 01/19/19 1510    Clinical Impression Statement  increase fatigue with elliptical and stair negotiation. Increase in weight tolerated with rows and lats. RUE external rotation was very difficult for patient, tactile cues for proper form.    Stability/Clinical Decision Making  Evolving/Moderate complexity    Rehab Potential  Good    PT Frequency  2x / week    PT Duration  8 weeks    PT Treatment/Interventions  ADLs/Self Care Home Management;Gait training;Stair training;Functional mobility training;Neuromuscular re-education;Balance training;Therapeutic exercise;Therapeutic activities;Patient/family education;Manual techniques    PT Next Visit Plan  continue to push endurance and strength as tolerated       Patient will benefit from skilled therapeutic intervention in order to improve the following deficits and impairments:  Postural dysfunction, Cardiopulmonary status limiting activity, Decreased activity tolerance, Decreased endurance, Decreased range of motion, Decreased strength, Impaired UE functional use, Difficulty walking, Decreased balance  Visit Diagnosis: 1. Muscle weakness (generalized)   2. Difficulty in walking, not elsewhere classified        Problem List Patient Active Problem List   Diagnosis Date Noted  . Labile blood glucose   . Anemia of chronic disease   . Debility 12/13/2018  . Diabetes mellitus type 2 in obese ([place])   . Medically noncompliant   . Pressure injury of skin 12/12/2018  . Sepsis ([place]) 11/30/2018  . Acute respiratory failure with hypoxia ([place]) 11/30/2018  . Acute encephalopathy   . Septic shock ([place])   . Hematuria 11/03/2018  . Genital warts 11/03/2018  . Meralgia paresthetica of right side 08/13/2018  . ESRD on dialysis ([place]) 06/04/2018  . Acute kidney injury superimposed on CKD ([place]) 05/10/2018  . Acidosis 05/10/2018   . Anemia 05/10/2018  . Acute renal failure superimposed on stage 5 chronic kidney disease, not on chronic dialysis ([place])   . Type 2 diabetes mellitus without complication, without long-term current use of insulin ([place]) 01/25/2018  . Essential hypertension 01/25/2017  . Well adult exam 01/17/2016  . Morbid obesity ([place]) 01/17/2016  . Delayed graft function of kidney transplant due to reason other than ATN or rejection requiring acute dialysis ([place]) 11/02/2012  .  Immunosuppression ([place]) 11/02/2012  . Kidney replaced by transplant 11/02/2012  . OSA (obstructive sleep apnea) 05/26/2011    Scot Jun, PTA 01/19/2019, 3:12 PM  [place], [name], [address] Phone: [phone]   Fax:  [phone]  Name: AHMARI DUERSON MRN: [id number] Date of Birth: February 18, 1976

## 2019-01-24 ENCOUNTER — Encounter: Payer: Self-pay | Admitting: Physical Therapy

## 2019-01-24 ENCOUNTER — Other Ambulatory Visit: Payer: Self-pay

## 2019-01-24 ENCOUNTER — Ambulatory Visit: Payer: 59 | Admitting: Physical Therapy

## 2019-01-24 DIAGNOSIS — R262 Difficulty in walking, not elsewhere classified: Secondary | ICD-10-CM | POA: Diagnosis not present

## 2019-01-24 DIAGNOSIS — M6281 Muscle weakness (generalized): Secondary | ICD-10-CM

## 2019-01-24 NOTE — Therapy (Signed)
[place], [name], [address] Phone: [phone]   Fax:  [phone]  Physical Therapy Treatment  Patient Details  Name: [name] MRN: [id number] Date of Birth: 1975-06-27 Referring Provider (PT): Vickey Sages Date: 01/24/2019  PT End of Session - 01/24/19 1423    Visit Number  9    Number of Visits  26    Date for PT Re-Evaluation  02/22/19    PT Start Time  5329    PT Stop Time  1427    PT Time Calculation (min)  42 min    Activity Tolerance  Patient tolerated treatment well    Behavior During Therapy  [facility] for tasks assessed/performed       Past Medical History:  Diagnosis Date  . Anemia    ESRD  . CHF (congestive heart failure) ([place])   . Diabetes mellitus without complication ([place])   . ESRD on hemodialysis ([place]) 06/07/2011   TTS [employer]. Started HD in 2006, got transplant in 2014 lasted until Dec 2019 then went back on HD.  Has L thigh AVG as of Jun 2020.  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   Marland Kitchen GERD (gastroesophageal reflux disease)   . Hypertension 111/29/2012   pt states he takes no HTN meds  . Hypothyroidism, secondary   . Morbid obesity ([place])   . Paroxysmal atrial fibrillation (HCC)   . Sleep apnea 06/07/2011   Pt had sllep study done 2 weeks ago- Dr. Joellyn Quails.  Does not  have a CPAP at this time.    Past Surgical History:  Procedure Laterality Date  . ARTERIOVENOUS GRAFT PLACEMENT  08/09/10   Left Thigh Graft by Dr. [name]  . AV FISTULA PLACEMENT Right 05/18/2018   Procedure: INSERTION OF ARTERIOVENOUS (AV) GORE-TEX GRAFT Left THIGH;  Surgeon: Waynetta Sandy, MD;  Location: [place];  Service: Vascular;  Laterality: Right;  . CAPD REMOVAL  05/08/2011   Procedure: CONTINUOUS AMBULATORY PERITONEAL DIALYSIS  (CAPD) CATHETER REMOVAL;  Surgeon: [name], MD;  [facility];  Service: General;  Laterality: N/A;  Removal  of CAPD catheter, Dr. requests to go after 100  . INSERTION OF DIALYSIS CATHETER  10/05/10   Right Femoral Cath insertion by Dr. [name].  Pt ahas had several caths inserted.  . IR FLUORO GUIDE CV LINE RIGHT  05/11/2018  . IR US GUIDE VASC ACCESS RIGHT  05/11/2018  . KIDNEY TRANSPLANT  2014  . UPPER EXTREMITY ANGIOGRAPHY Bilateral 05/13/2018   Procedure: UPPER EXTREMITY ANGIOGRAPHY - bilarteral;  Surgeon: Marty Heck, MD;  Location: [place] [facility];  Service: Cardiovascular;  Laterality: Bilateral;    There were no vitals filed for this visit.  Subjective Assessment - 01/24/19 1352    Subjective  Still improving, patient reported difficulty with shoulder abduction and scaption     No pain                   [facility] Adult PT Treatment/Exercise - 01/24/19 0001      Ambulation/Gait   Stairs  Yes    Stairs Assistance  6: Modified independent (Device/Increase time)    Stair Management Technique  No rails;One rail Right;Alternating pattern    Number of Stairs  36    Height of Stairs  6    Gait Comments  1 flight then back down, outside, gait up hill  Lumbar Exercises: Aerobic   Elliptical  I10 R6 x2 min     Nustep  L6 x 6 min       Lumbar Exercises: Machines for Strengthening   Leg Press  60lb 3x10    Other Lumbar Machine Exercise  Rows 55lb 2x15; Chest press 35lb 2x10     Other Lumbar Machine Exercise  Lats 45lb 2x15      Knee/Hip Exercises: Machines for Strengthening   Cybex Knee Extension  20lb 2x10    Cybex Knee Flexion  55lb 2x10      Shoulder Exercises: Standing   Flexion  Both;Strengthening;20 reps;Weights    Shoulder Flexion Weight (lbs)  1    ABduction  AROM;Strengthening;Both;20 reps    Extension  Both;20 reps;Strengthening;Weights    Extension Weight (lbs)  10                  PT Long Term Goals - 01/12/19 1556      PT LONG TERM GOAL #1   Title  increase shoulder ROM to 140 degrees flexion    Status  On-going      PT  LONG TERM GOAL #3   Title  walk .75 miles    Status  On-going            Plan - 01/24/19 1424    Clinical Impression Statement  patient persevered through exercises despite visible fatigue. able to increase weight with Upper and lower extremity. limited ROM with light resistance with flexion and abduction. Compensated with shoulder elevation, extension of back, lateral bending of the trunk towards unaffected shoulder    Stability/Clinical Decision Making  Evolving/Moderate complexity    Rehab Potential  Good    PT Frequency  2x / week    PT Duration  8 weeks    PT Treatment/Interventions  ADLs/Self Care Home Management;Gait training;Stair training;Functional mobility training;Neuromuscular re-education;Balance training;Therapeutic exercise;Therapeutic activities;Patient/family education;Manual techniques    PT Next Visit Plan  continue to push endurance and strength as tolerated       Patient will benefit from skilled therapeutic intervention in order to improve the following deficits and impairments:  Postural dysfunction, Cardiopulmonary status limiting activity, Decreased activity tolerance, Decreased endurance, Decreased range of motion, Decreased strength, Impaired UE functional use, Difficulty walking, Decreased balance  Visit Diagnosis: 1. Muscle weakness (generalized)   2. Difficulty in walking, not elsewhere classified        Problem List Patient Active Problem List   Diagnosis Date Noted  . Labile blood glucose   . Anemia of chronic disease   . Debility 12/13/2018  . Diabetes mellitus type 2 in obese ([place])   . Medically noncompliant   . Pressure injury of skin 12/12/2018  . Sepsis ([place]) 11/30/2018  . Acute respiratory failure with hypoxia ([place]) 11/30/2018  . Acute encephalopathy   . Septic shock ([place])   . Hematuria 11/03/2018  . Genital warts 11/03/2018  . Meralgia paresthetica of right side 08/13/2018  . ESRD on dialysis ([place]) 06/04/2018  . Acute kidney  injury superimposed on CKD ([place]) 05/10/2018  . Acidosis 05/10/2018  . Anemia 05/10/2018  . Acute renal failure superimposed on stage 5 chronic kidney disease, not on chronic dialysis ([place])   . Type 2 diabetes mellitus without complication, without long-term current use of insulin ([place]) 01/25/2018  . Essential hypertension 01/25/2017  . Well adult exam 01/17/2016  . Morbid obesity ([place]) 01/17/2016  . Delayed graft function of kidney transplant due to reason other than ATN or rejection  requiring acute dialysis ([place]) 11/02/2012  . Immunosuppression ([place]) 11/02/2012  . Kidney replaced by transplant 11/02/2012  . OSA (obstructive sleep apnea) 05/26/2011    Scot Jun, PTA 01/24/2019, 2:29 PM  [place], [name], [address] Phone: [phone]   Fax:  [phone]  Name: [name] MRN: [id number] Date of Birth: 1975-09-12

## 2019-01-26 ENCOUNTER — Ambulatory Visit: Payer: 59 | Admitting: Physical Therapy

## 2019-01-26 ENCOUNTER — Other Ambulatory Visit: Payer: Self-pay

## 2019-01-26 ENCOUNTER — Encounter: Payer: Self-pay | Admitting: Physical Therapy

## 2019-01-26 DIAGNOSIS — R262 Difficulty in walking, not elsewhere classified: Secondary | ICD-10-CM | POA: Diagnosis not present

## 2019-01-26 DIAGNOSIS — M6281 Muscle weakness (generalized): Secondary | ICD-10-CM

## 2019-01-26 NOTE — Therapy (Addendum)
[place], [name], [address] Phone: [phone]   Fax:  [phone]  Physical Therapy Treatment  Patient Details  Name: [name] MRN: [id number] Date of Birth: 1975/12/16 Referring Provider (PT): Vickey Sages Date: 01/26/2019  PT End of Session - 01/26/19 1426    Visit Number  10    Number of Visits  26    Date for PT Re-Evaluation  02/22/19    PT Start Time  6761    PT Stop Time  1426    PT Time Calculation (min)  31 min    Activity Tolerance  Patient tolerated treatment well    Behavior During Therapy  [facility] for tasks assessed/performed       Past Medical History:  Diagnosis Date  . Anemia    ESRD  . CHF (congestive heart failure) ([place])   . Diabetes mellitus without complication ([place])   . ESRD on hemodialysis ([place]) 06/07/2011   TTS [employer]. Started HD in 2006, got transplant in 2014 lasted until Dec 2019 then went back on HD.  Has L thigh AVG as of Jun 2020.  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   Marland Kitchen GERD (gastroesophageal reflux disease)   . Hypertension 111/29/2012   pt states he takes no HTN meds  . Hypothyroidism, secondary   . Morbid obesity ([place])   . Paroxysmal atrial fibrillation (H[facility])   . Sleep apnea 06/07/2011   Pt had sllep study done 2 weeks ago- Dr. Joellyn Quails.  Does not  have a CPAP at this time.    Past Surgical History:  Procedure Laterality Date  . ARTERIOVENOUS GRAFT PLACEMENT  08/09/10   Left Thigh Graft by Dr. [name]  . AV FISTULA PLACEMENT Right 05/18/2018   Procedure: INSERTION OF ARTERIOVENOUS (AV) GORE-TEX GRAFT Left THIGH;  Surgeon: Waynetta Sandy, MD;  Location: [place];  Service: Vascular;  Laterality: Right;  . CAPD REMOVAL  05/08/2011   Procedure: CONTINUOUS AMBULATORY PERITONEAL DIALYSIS  (CAPD) CATHETER REMOVAL;  Surgeon: [name], MD;  [facility];  Service: General;  Laterality: N/A;  Removal  of CAPD catheter, Dr. requests to go after 100  . INSERTION OF DIALYSIS CATHETER  10/05/10   Right Femoral Cath insertion by Dr. [name].  Pt ahas had several caths inserted.  . IR FLUORO GUIDE CV LINE RIGHT  05/11/2018  . IR US GUIDE VASC ACCESS RIGHT  05/11/2018  . KIDNEY TRANSPLANT  2014  . UPPER EXTREMITY ANGIOGRAPHY Bilateral 05/13/2018   Procedure: UPPER EXTREMITY ANGIOGRAPHY - bilarteral;  Surgeon: Marty Heck, MD;  Location: [place] [facility];  Service: Cardiovascular;  Laterality: Bilateral;    There were no vitals filed for this visit.  Subjective Assessment - 01/26/19 1357    Subjective  shoulder still the same with pain during abduction at end range.      No pain                 [facility] Adult PT Treatment/Exercise - 01/26/19 0001      Ambulation/Gait   Stairs  Yes    Stairs Assistance  6: Modified independent (Device/Increase time)    Stair Management Technique  No rails;One rail Right;Alternating pattern    Number of Stairs  72    Height of Stairs  6    Gait Comments  2 flight then back down, outside, gait up hill  Lumbar Exercises: Aerobic   Elliptical  I7 R4 x4 min       Knee/Hip Exercises: Machines for Strengthening   Cybex Knee Extension  25lb 2x10    Cybex Knee Flexion  55lb 2x10    Cybex Leg Press  40# 3x10      Shoulder Exercises: Seated   Row  Strengthening;Both;10 reps;20 reps;Weights    Row Weight (lbs)  45#      Shoulder Exercises: Standing   Flexion  Both;Strengthening;20 reps;Weights    Shoulder Flexion Weight (lbs)  1    Flexion Limitations  focused on eccentric control of abudction                  PT Long Term Goals - 01/12/19 1556      PT LONG TERM GOAL #1   Title  increase shoulder ROM to 140 degrees flexion    Status  On-going      PT LONG TERM GOAL #3   Title  walk .75 miles    Status  On-going            Plan - 01/26/19 1428    Clinical Impression Statement Pt ~ 10 minutes late for  today's PT session. Excessive R shoulder elevation with flexion and abduction focus on eccentric control shoulder abductors to address R shoulder limited ROM and pain from previous injury. General exercises to address fatigue.  Visible fatigue after stairs and ambulation uphill however able to complete intervention.   Stability/Clinical Decision Making  Evolving/Moderate complexity    Rehab Potential  Good    PT Frequency  2x / week    PT Duration  8 weeks    PT Treatment/Interventions  ADLs/Self Care Home Management;Gait training;Stair training;Functional mobility training;Neuromuscular re-education;Balance training;Therapeutic exercise;Therapeutic activities;Patient/family education;Manual techniques    PT Next Visit Plan  continue to push endurance and strength as tolerated       Patient will benefit from skilled therapeutic intervention in order to improve the following deficits and impairments:  Postural dysfunction, Cardiopulmonary status limiting activity, Decreased activity tolerance, Decreased endurance, Decreased range of motion, Decreased strength, Impaired UE functional use, Difficulty walking, Decreased balance  Visit Diagnosis: 1. Muscle weakness (generalized)   2. Difficulty in walking, not elsewhere classified        Problem List Patient Active Problem List   Diagnosis Date Noted  . Labile blood glucose   . Anemia of chronic disease   . Debility 12/13/2018  . Diabetes mellitus type 2 in obese ([place])   . Medically noncompliant   . Pressure injury of skin 12/12/2018  . Sepsis ([place]) 11/30/2018  . Acute respiratory failure with hypoxia ([place]) 11/30/2018  . Acute encephalopathy   . Septic shock ([place])   . Hematuria 11/03/2018  . Genital warts 11/03/2018  . Meralgia paresthetica of right side 08/13/2018  . ESRD on dialysis ([place]) 06/04/2018  . Acute kidney injury superimposed on CKD ([place]) 05/10/2018  . Acidosis 05/10/2018  . Anemia 05/10/2018  . Acute renal failure  superimposed on stage 5 chronic kidney disease, not on chronic dialysis ([place])   . Type 2 diabetes mellitus without complication, without long-term current use of insulin ([place]) 01/25/2018  . Essential hypertension 01/25/2017  . Well adult exam 01/17/2016  . Morbid obesity ([place]) 01/17/2016  . Delayed graft function of kidney transplant due to reason other than ATN or rejection requiring acute dialysis ([place]) 11/02/2012  . Immunosuppression ([place]) 11/02/2012  . Kidney replaced by transplant 11/02/2012  . OSA (obstructive sleep  apnea) 05/26/2011   Dylan Riggers, SPTA Scot Jun, PTA 01/26/2019, 2:30 PM  [place], [name], [address] Phone: [phone]   Fax:  [phone]  Name: [name] MRN: [id number] Date of Birth: 12-16-1975

## 2019-01-31 ENCOUNTER — Encounter: Payer: Self-pay | Admitting: Physical Therapy

## 2019-01-31 ENCOUNTER — Ambulatory Visit: Payer: 59 | Admitting: Physical Therapy

## 2019-01-31 ENCOUNTER — Other Ambulatory Visit: Payer: Self-pay

## 2019-01-31 DIAGNOSIS — R262 Difficulty in walking, not elsewhere classified: Secondary | ICD-10-CM | POA: Diagnosis not present

## 2019-01-31 DIAGNOSIS — M6281 Muscle weakness (generalized): Secondary | ICD-10-CM

## 2019-01-31 NOTE — Therapy (Addendum)
[place], [name], [address] Phone: [phone]   Fax:  [phone]  Physical Therapy Treatment  Patient Details  Name: [name] MRN: [id number] Date of Birth: 09-05-1975 Referring Provider (PT): Vickey Sages Date: 01/31/2019  PT End of Session - 01/31/19 1427    Visit Number  11    Number of Visits  26    Date for PT Re-Evaluation  02/22/19    PT Start Time  3875    PT Stop Time  1426    PT Time Calculation (min)  41 min       Past Medical History:  Diagnosis Date  . Anemia    ESRD  . CHF (congestive heart failure) ([place])   . Diabetes mellitus without complication ([place])   . ESRD on hemodialysis ([place]) 06/07/2011   TTS [employer]. Started HD in 2006, got transplant in 2014 lasted until Dec 2019 then went back on HD.  Has L thigh AVG as of Jun 2020.  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   Marland Kitchen GERD (gastroesophageal reflux disease)   . Hypertension 111/29/2012   pt states he takes no HTN meds  . Hypothyroidism, secondary   . Morbid obesity ([place])   . Paroxysmal atrial fibrillation ([facility])   . Sleep apnea 06/07/2011   Pt had sllep study done 2 weeks ago- Dr. Joellyn Quails.  Does not  have a CPAP at this time.    Past Surgical History:  Procedure Laterality Date  . ARTERIOVENOUS GRAFT PLACEMENT  08/09/10   Left Thigh Graft by Dr. [name]  . AV FISTULA PLACEMENT Right 05/18/2018   Procedure: INSERTION OF ARTERIOVENOUS (AV) GORE-TEX GRAFT Left THIGH;  Surgeon: Waynetta Sandy, MD;  Location: [facility];  Service: Vascular;  Laterality: Right;  . CAPD REMOVAL  05/08/2011   Procedure: CONTINUOUS AMBULATORY PERITONEAL DIALYSIS  (CAPD) CATHETER REMOVAL;  Surgeon: [name], MD;  [facility];  Service: General;  Laterality: N/A;  Removal of CAPD catheter, Dr. requests to go after 100  . INSERTION OF DIALYSIS CATHETER  10/05/10   Right Femoral Cath  insertion by Dr. [name].  Pt ahas had several caths inserted.  . IR FLUORO GUIDE CV LINE RIGHT  05/11/2018  . IR US GUIDE VASC ACCESS RIGHT  05/11/2018  . KIDNEY TRANSPLANT  2014  . UPPER EXTREMITY ANGIOGRAPHY Bilateral 05/13/2018   Procedure: UPPER EXTREMITY ANGIOGRAPHY - bilarteral;  Surgeon: Marty Heck, MD;  Location: [facility];  Service: Cardiovascular;  Laterality: Bilateral;    There were no vitals filed for this visit.  Subjective Assessment - 01/31/19 1346    Subjective  cant complain, shoulder no better but no worse.    Pain Score  0-No pain                       [facility] Adult PT Treatment/Exercise - 01/31/19 0001      Ambulation/Gait   Stairs  Yes    Stairs Assistance  6: Modified independent (Device/Increase time)    Stair Management Technique  No rails;One rail Right;Alternating pattern    Number of Stairs  96    Height of Stairs  6    Gait Comments  outside and gait up the hill      Lumbar Exercises: Aerobic   Elliptical  I7 R4 x5 min       Lumbar Exercises:  Machines for Strengthening   Leg Press  60lb 3x15      Knee/Hip Exercises: Machines for Strengthening   Cybex Knee Extension  25lb 2x15    Cybex Knee Flexion  55lb 2x15      Shoulder Exercises: Seated   Row  Strengthening;Both;Weights   2x15   Row Weight (lbs)  45#    Row Limitations  2x15    Other Seated Exercises  lat pulldown BIL 45# 2x15      Shoulder Exercises: Standing   External Rotation  Strengthening;Right;20 reps;Theraband    Theraband Level (Shoulder External Rotation)  Level 2 (Red)    External Rotation Limitations  tactile and verbal cues proper form    Internal Rotation  Strengthening;Right;20 reps;Theraband    Theraband Level (Shoulder Internal Rotation)  Level 2 (Red)    Flexion  Both;Strengthening;20 reps;Weights    Shoulder Flexion Weight (lbs)  2    ABduction  Strengthening;Both;20 reps;Weights    Shoulder ABduction Weight (lbs)  2    Other  Standing Exercises  Ext. 10# BIL 2x15      Manual Therapy   Passive ROM  R shoulder flexion                  PT Long Term Goals - 01/12/19 1556      PT LONG TERM GOAL #1   Title  increase shoulder ROM to 140 degrees flexion    Status  On-going      PT LONG TERM GOAL #3   Title  walk .75 miles    Status  On-going            Plan - 01/31/19 1428    Clinical Impression Statement  patient was able to participate in exercises without being as fatigued even with increasing reps and distance. R shoulder still limited due to injury.    PT Treatment/Interventions  ADLs/Self Care Home Management;Gait training;Stair training;Functional mobility training;Neuromuscular re-education;Balance training;Therapeutic exercise;Therapeutic activities;Patient/family education;Manual techniques       Patient will benefit from skilled therapeutic intervention in order to improve the following deficits and impairments:  Postural dysfunction, Cardiopulmonary status limiting activity, Decreased activity tolerance, Decreased endurance, Decreased range of motion, Decreased strength, Impaired UE functional use, Difficulty walking, Decreased balance  Visit Diagnosis: Muscle weakness (generalized)  Difficulty in walking, not elsewhere classified     Problem List Patient Active Problem List   Diagnosis Date Noted  . Labile blood glucose   . Anemia of chronic disease   . Debility 12/13/2018  . Diabetes mellitus type 2 in obese ([place])   . Medically noncompliant   . Pressure injury of skin 12/12/2018  . Sepsis ([place]) 11/30/2018  . Acute respiratory failure with hypoxia ([place]) 11/30/2018  . Acute encephalopathy   . Septic shock ([place])   . Hematuria 11/03/2018  . Genital warts 11/03/2018  . Meralgia paresthetica of right side 08/13/2018  . ESRD on dialysis ([place]) 06/04/2018  . Acute kidney injury superimposed on CKD ([place]) 05/10/2018  . Acidosis 05/10/2018  . Anemia 05/10/2018  . Acute  renal failure superimposed on stage 5 chronic kidney disease, not on chronic dialysis ([place])   . Type 2 diabetes mellitus without complication, without long-term current use of insulin ([place]) 01/25/2018  . Essential hypertension 01/25/2017  . Well adult exam 01/17/2016  . Morbid obesity ([place]) 01/17/2016  . Delayed graft function of kidney transplant due to reason other than ATN or rejection requiring acute dialysis ([place]) 11/02/2012  . Immunosuppression ([place]) 11/02/2012  .  Kidney replaced by transplant 11/02/2012  . OSA (obstructive sleep apnea) 05/26/2011   During this treatment session, the therapist was present, participating in and directing the treatment.   Vickii Chafe 01/31/2019, 2:39 PM  [place], [name], [address] Phone: [phone]   Fax:  [phone]  Name: ALOK MINSHALL MRN: [id number] Date of Birth: 09/23/1975

## 2019-02-02 ENCOUNTER — Ambulatory Visit: Payer: 59 | Admitting: Physical Therapy

## 2019-02-16 ENCOUNTER — Encounter: Payer: Self-pay | Admitting: Physical Therapy

## 2019-02-16 ENCOUNTER — Other Ambulatory Visit: Payer: Self-pay

## 2019-02-16 ENCOUNTER — Ambulatory Visit: Payer: 59 | Attending: Physical Medicine and Rehabilitation | Admitting: Physical Therapy

## 2019-02-16 DIAGNOSIS — R262 Difficulty in walking, not elsewhere classified: Secondary | ICD-10-CM | POA: Insufficient documentation

## 2019-02-16 DIAGNOSIS — M6281 Muscle weakness (generalized): Secondary | ICD-10-CM | POA: Insufficient documentation

## 2019-02-16 NOTE — Therapy (Signed)
[place], [name], [address] Phone: [phone]   Fax:  [phone]  Physical Therapy Treatment  Patient Details  Name: [name] MRN: [id number] Date of Birth: [birth year] Referring Provider (PT): Vickey Sages Date: 02/16/2019  PT End of Session - 02/16/19 1340    Visit Number  12    Number of Visits  26    Date for PT Re-Evaluation  02/22/19    PT Start Time  1300    PT Stop Time  1340    PT Time Calculation (min)  40 min    Activity Tolerance  Patient tolerated treatment well    Behavior During Therapy  [facility] for tasks assessed/performed       Past Medical History:  Diagnosis Date  . Anemia    ESRD  . CHF (congestive heart failure) ([place])   . Diabetes mellitus without complication ([place])   . ESRD on hemodialysis ([place]) 06/07/2011   TTS [employer]. Started HD in 2006, got transplant in 2014 lasted until Dec 2019 then went back on HD.  Has L thigh AVG as of Jun 2020.  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   Marland Kitchen GERD (gastroesophageal reflux disease)   . Hypertension 111/29/2012   pt states he takes no HTN meds  . Hypothyroidism, secondary   . Morbid obesity ([place])   . Paroxysmal atrial fibrillation ([facility])   . Sleep apnea 06/07/2011   Pt had sllep study done 2 weeks ago- Dr. Joellyn Quails.  Does not  have a CPAP at this time.    Past Surgical History:  Procedure Laterality Date  . ARTERIOVENOUS GRAFT PLACEMENT  08/09/10   Left Thigh Graft by Dr. [name]  . AV FISTULA PLACEMENT Right 05/18/2018   Procedure: INSERTION OF ARTERIOVENOUS (AV) GORE-TEX GRAFT Left THIGH;  Surgeon: Waynetta Sandy, MD;  Location: [facility];  Service: Vascular;  Laterality: Right;  . CAPD REMOVAL  05/08/2011   Procedure: CONTINUOUS AMBULATORY PERITONEAL DIALYSIS  (CAPD) CATHETER REMOVAL;  Surgeon: [name], MD;  [facility];  Service: General;  Laterality: N/A;  Removal  of CAPD catheter, Dr. requests to go after 100  . INSERTION OF DIALYSIS CATHETER  10/05/10   Right Femoral Cath insertion by Dr. [name].  Pt ahas had several caths inserted.  . IR FLUORO GUIDE CV LINE RIGHT  05/11/2018  . IR US GUIDE VASC ACCESS RIGHT  05/11/2018  . KIDNEY TRANSPLANT  2014  . UPPER EXTREMITY ANGIOGRAPHY Bilateral 05/13/2018   Procedure: UPPER EXTREMITY ANGIOGRAPHY - bilarteral;  Surgeon: Marty Heck, MD;  Location: [facility];  Service: Cardiovascular;  Laterality: Bilateral;    There were no vitals filed for this visit.  Subjective Assessment - 02/16/19 1259    Subjective  shoulder doesnt hurt as bad, been doing HEP but not a smuch as he should, has been walking more consistently.    Pain Score  6     Pain Location  Shoulder    Pain Orientation  Right                       [facility] Adult PT Treatment/Exercise - 02/16/19 0001      Ambulation/Gait   Stairs  Yes    Stairs Assistance  6: Modified independent (Device/Increase time)    Stair Management Technique  No rails;One rail Right;Alternating pattern    Number of  Stairs  72    Height of Stairs  6    Gait Comments  outside and gait up the hill; patient visibly fatigued after stairs, ankle instability and improper eccentric control of L knee      Lumbar Exercises: Aerobic   Elliptical  I7 R7 x5 min       Lumbar Exercises: Machines for Strengthening   Leg Press  60lb 3x15      Lumbar Exercises: Seated   Other Seated Lumbar Exercises  STS 2x8 with ball OHP      Shoulder Exercises: Seated   Row  Strengthening;Both;20 reps;Weights    Row Weight (lbs)  55#    Other Seated Exercises  lat pulldown BIL 55# 2x10      Shoulder Exercises: Standing   External Rotation  Strengthening;Right;20 reps;Theraband    Theraband Level (Shoulder External Rotation)  Level 2 (Red)    Internal Rotation  Strengthening;Right;20 reps;Theraband    Theraband Level (Shoulder Internal Rotation)  Level 2  (Red)    Flexion  Both;Strengthening;20 reps;Weights    Shoulder Flexion Weight (lbs)  2    Flexion Limitations  shlder elevation of RT.     ABduction  Strengthening;Both;20 reps;Weights    Shoulder ABduction Weight (lbs)  2    ABduction Limitations  Rt shldr elevation    Extension  Both;20 reps;Strengthening;Weights    Extension Weight (lbs)  15                  PT Long Term Goals - 02/16/19 1305      PT LONG TERM GOAL #1   Title  increase shoulder ROM to 140 degrees flexion    Baseline  100    Status  On-going            Plan - 02/16/19 1341    Clinical Impression Statement  patient able to participate throughout exercises, fatigue was more prominent but to be expected after a two week gap. R shoulder still limited in ROM and will continue to benefit from PT to further address Rt shldr ROM, strength, and overall functional endurance.    PT Treatment/Interventions  ADLs/Self Care Home Management;Gait training;Stair training;Functional mobility training;Neuromuscular re-education;Balance training;Therapeutic exercise;Therapeutic activities;Patient/family education;Manual techniques    PT Next Visit Plan  continue to push endurance and strength as tolerated       Patient will benefit from skilled therapeutic intervention in order to improve the following deficits and impairments:  Postural dysfunction, Cardiopulmonary status limiting activity, Decreased activity tolerance, Decreased endurance, Decreased range of motion, Decreased strength, Impaired UE functional use, Difficulty walking, Decreased balance  Visit Diagnosis: Muscle weakness (generalized)  Difficulty in walking, not elsewhere classified     Problem List Patient Active Problem List   Diagnosis Date Noted  . Labile blood glucose   . Anemia of chronic disease   . Debility 12/13/2018  . Diabetes mellitus type 2 in obese ([place])   . Medically noncompliant   . Pressure injury of skin 12/12/2018  .  Sepsis ([place]) 11/30/2018  . Acute respiratory failure with hypoxia ([place]) 11/30/2018  . Acute encephalopathy   . Septic shock ([place])   . Hematuria 11/03/2018  . Genital warts 11/03/2018  . Meralgia paresthetica of right side 08/13/2018  . ESRD on dialysis ([place]) 06/04/2018  . Acute kidney injury superimposed on CKD ([place]) 05/10/2018  . Acidosis 05/10/2018  . Anemia 05/10/2018  . Acute renal failure superimposed on stage 5 chronic kidney disease, not on chronic dialysis ([place])   .  Type 2 diabetes mellitus without complication, without long-term current use of insulin ([place]) 01/25/2018  . Essential hypertension 01/25/2017  . Well adult exam 01/17/2016  . Morbid obesity ([place]) 01/17/2016  . Delayed graft function of kidney transplant due to reason other than ATN or rejection requiring acute dialysis ([place]) 11/02/2012  . Immunosuppression ([place]) 11/02/2012  . Kidney replaced by transplant 11/02/2012  . OSA (obstructive sleep apnea) 05/26/2011    Dylan Bridgid Printz, SPTA 02/16/2019, 1:43 PM  [place], [name], [address] Phone: [phone]   Fax:  [phone]  Name: CASSON CATENA MRN: [id number] Date of Birth: Dec 26, 1975

## 2019-02-21 ENCOUNTER — Other Ambulatory Visit: Payer: Self-pay

## 2019-02-21 ENCOUNTER — Encounter: Payer: Self-pay | Admitting: Physical Therapy

## 2019-02-21 ENCOUNTER — Ambulatory Visit: Payer: 59 | Admitting: Physical Therapy

## 2019-02-21 DIAGNOSIS — R262 Difficulty in walking, not elsewhere classified: Secondary | ICD-10-CM

## 2019-02-21 DIAGNOSIS — M6281 Muscle weakness (generalized): Secondary | ICD-10-CM

## 2019-02-21 NOTE — Therapy (Signed)
[place], [name], [address] Phone: [phone]   Fax:  [phone]  Physical Therapy Treatment  Patient Details  Name: [name] MRN: [id number] Date of Birth: [birth year] Referring Provider (PT): Vickey Sages Date: 02/21/2019  PT End of Session - 02/21/19 1339    Visit Number  13    Number of Visits  26    Date for PT Re-Evaluation  02/22/19    PT Start Time  1300    PT Stop Time  1342    PT Time Calculation (min)  42 min       Past Medical History:  Diagnosis Date  . Anemia    ESRD  . CHF (congestive heart failure) ([place])   . Diabetes mellitus without complication ([place])   . ESRD on hemodialysis ([place]) 06/07/2011   TTS [employer]. Started HD in 2006, got transplant in 2014 lasted until Dec 2019 then went back on HD.  Has L thigh AVG as of Jun 2020.  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   Marland Kitchen GERD (gastroesophageal reflux disease)   . Hypertension 111/29/2012   pt states he takes no HTN meds  . Hypothyroidism, secondary   . Morbid obesity ([place])   . Paroxysmal atrial fibrillation ([facility])   . Sleep apnea 06/07/2011   Pt had sllep study done 2 weeks ago- Dr. Joellyn Quails.  Does not  have a CPAP at this time.    Past Surgical History:  Procedure Laterality Date  . ARTERIOVENOUS GRAFT PLACEMENT  08/09/10   Left Thigh Graft by Dr. [name]  . AV FISTULA PLACEMENT Right 05/18/2018   Procedure: INSERTION OF ARTERIOVENOUS (AV) GORE-TEX GRAFT Left THIGH;  Surgeon: Waynetta Sandy, MD;  Location: [facility];  Service: Vascular;  Laterality: Right;  . CAPD REMOVAL  05/08/2011   Procedure: CONTINUOUS AMBULATORY PERITONEAL DIALYSIS  (CAPD) CATHETER REMOVAL;  Surgeon: [name], MD;  [facility];  Service: General;  Laterality: N/A;  Removal of CAPD catheter, Dr. requests to go after 100  . INSERTION OF DIALYSIS CATHETER  10/05/10   Right Femoral Cath  insertion by Dr. [name].  Pt ahas had several caths inserted.  . IR FLUORO GUIDE CV LINE RIGHT  05/11/2018  . IR US GUIDE VASC ACCESS RIGHT  05/11/2018  . KIDNEY TRANSPLANT  2014  . UPPER EXTREMITY ANGIOGRAPHY Bilateral 05/13/2018   Procedure: UPPER EXTREMITY ANGIOGRAPHY - bilarteral;  Surgeon: Marty Heck, MD;  Location: [facility];  Service: Cardiovascular;  Laterality: Bilateral;    There were no vitals filed for this visit.  Subjective Assessment - 02/21/19 1303    Subjective  R shoulder feels fine at rest hurts during abduction and flexion. still walking and performing HEP more    Pain Score  0-No pain    Pain Location  Shoulder    Pain Orientation  Right                       [facility] Adult PT Treatment/Exercise - 02/21/19 0001      Ambulation/Gait   Stairs  Yes    Stairs Assistance  7: Independent    Stair Management Technique  No rails;One rail Right;Alternating pattern    Number of Stairs  96    Gait Comments  outside and gait up the hill; patient visibly fatigued after stairs, ankle instability and improper eccentric control of  L knee      Lumbar Exercises: Aerobic   Elliptical  I7 R7 x5 min       Lumbar Exercises: Machines for Strengthening   Leg Press  80# 2x15      Lumbar Exercises: Seated   Other Seated Lumbar Exercises  STS 2x10 with ball OHP      Shoulder Exercises: Seated   Row  Strengthening;Both;20 reps;Weights    Row Weight (lbs)  65#    Other Seated Exercises  lat pulldown BIL 65# 2x10      Shoulder Exercises: Standing   External Rotation  Strengthening;Right;Theraband   2x10   Theraband Level (Shoulder External Rotation)  Level 3 (Green)    Internal Rotation  Strengthening;Right;Theraband   2x15   Theraband Level (Shoulder Internal Rotation)  Level 3 (Green)    Flexion  Both;Strengthening;Weights   2x8   Shoulder Flexion Weight (lbs)  3    Flexion Limitations  shlder elevation of RT.     Extension   Both;Strengthening;Weights   2x15   Extension Weight (lbs)  15      Shoulder Exercises: ROM/Strengthening   UBE (Upper Arm Bike)  lvl 4 55fwd/2bck                  PT Long Term Goals - 02/16/19 1305      PT LONG TERM GOAL #1   Title  increase shoulder ROM to 140 degrees flexion    Baseline  100    Status  On-going            Plan - 02/21/19 1340    Clinical Impression Statement  pt able to participate in all exercises despite fatigue. fatigue evident during ellipitcal, stairs, and STS with OHP. although patient was fatigued he completed exercises. Rt. shoulder abduction avoided today due to pain intolerance of pt. pt will continue to benefit from PT to further address functional endurance Rt shoulder strength    PT Treatment/Interventions  ADLs/Self Care Home Management;Gait training;Stair training;Functional mobility training;Neuromuscular re-education;Balance training;Therapeutic exercise;Therapeutic activities;Patient/family education;Manual techniques    PT Next Visit Plan  continue to push endurance and strength as tolerated       Patient will benefit from skilled therapeutic intervention in order to improve the following deficits and impairments:  Postural dysfunction, Cardiopulmonary status limiting activity, Decreased activity tolerance, Decreased endurance, Decreased range of motion, Decreased strength, Impaired UE functional use, Difficulty walking, Decreased balance  Visit Diagnosis: Muscle weakness (generalized)  Difficulty in walking, not elsewhere classified     Problem List Patient Active Problem List   Diagnosis Date Noted  . Labile blood glucose   . Anemia of chronic disease   . Debility 12/13/2018  . Diabetes mellitus type 2 in obese ([place])   . Medically noncompliant   . Pressure injury of skin 12/12/2018  . Sepsis ([place]) 11/30/2018  . Acute respiratory failure with hypoxia ([place]) 11/30/2018  . Acute encephalopathy   . Septic shock ([place])    . Hematuria 11/03/2018  . Genital warts 11/03/2018  . Meralgia paresthetica of right side 08/13/2018  . ESRD on dialysis ([place]) 06/04/2018  . Acute kidney injury superimposed on CKD ([place]) 05/10/2018  . Acidosis 05/10/2018  . Anemia 05/10/2018  . Acute renal failure superimposed on stage 5 chronic kidney disease, not on chronic dialysis ([place])   . Type 2 diabetes mellitus without complication, without long-term current use of insulin ([place]) 01/25/2018  . Essential hypertension 01/25/2017  . Well adult exam 01/17/2016  . Morbid obesity (  [place]) 01/17/2016  . Delayed graft function of kidney transplant due to reason other than ATN or rejection requiring acute dialysis ([place]) 11/02/2012  . Immunosuppression ([place]) 11/02/2012  . Kidney replaced by transplant 11/02/2012  . OSA (obstructive sleep apnea) 05/26/2011    Dylan Aina Rossbach,SPTA 02/21/2019, 1:43 PM  [place], [name], [address] Phone: [phone]   Fax:  [phone]  Name: SKYY MCKNIGHT MRN: [id number] Date of Birth: 1976/04/26

## 2019-02-23 ENCOUNTER — Encounter: Payer: Self-pay | Admitting: Physical Therapy

## 2019-02-23 ENCOUNTER — Other Ambulatory Visit: Payer: Self-pay

## 2019-02-23 ENCOUNTER — Ambulatory Visit: Payer: 59 | Admitting: Physical Therapy

## 2019-02-23 DIAGNOSIS — R262 Difficulty in walking, not elsewhere classified: Secondary | ICD-10-CM

## 2019-02-23 DIAGNOSIS — M6281 Muscle weakness (generalized): Secondary | ICD-10-CM

## 2019-02-23 NOTE — Therapy (Signed)
[place] [address] [place], [name], [address] Phone: [phone]   Fax:  [phone]  Physical Therapy Treatment  Patient Details  Name: [name] MRN: [id number] Date of Birth: March 07, 1976 Referring Provider (PT): Vickey Sages Date: 02/23/2019  PT End of Session - 02/23/19 1338    Visit Number  14    Number of Visits  26    Date for PT Re-Evaluation  02/22/19    PT Start Time  1300    PT Stop Time  1341    PT Time Calculation (min)  41 min    Activity Tolerance  Patient tolerated treatment well    Behavior During Therapy  [facility] for tasks assessed/performed       Past Medical History:  Diagnosis Date  . Anemia    ESRD  . CHF (congestive heart failure) ([place])   . Diabetes mellitus without complication ([place])   . ESRD on hemodialysis ([place]) 06/07/2011   TTS [employer]. Started HD in 2006, got transplant in 2014 lasted until Dec 2019 then went back on HD.  Has L thigh AVG as of Jun 2020.  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   Marland Kitchen GERD (gastroesophageal reflux disease)   . Hypertension 111/29/2012   pt states he takes no HTN meds  . Hypothyroidism, secondary   . Morbid obesity ([place])   . Paroxysmal atrial fibrillation (HCC)   . Sleep apnea 06/07/2011   Pt had sllep study done 2 weeks ago- Dr. Joellyn Quails.  Does not  have a CPAP at this time.    Past Surgical History:  Procedure Laterality Date  . ARTERIOVENOUS GRAFT PLACEMENT  08/09/10   Left Thigh Graft by Dr. [name]  . AV FISTULA PLACEMENT Right 05/18/2018   Procedure: INSERTION OF ARTERIOVENOUS (AV) GORE-TEX GRAFT Left THIGH;  Surgeon: Waynetta Sandy, MD;  Location: [place];  Service: Vascular;  Laterality: Right;  . CAPD REMOVAL  05/08/2011   Procedure: CONTINUOUS AMBULATORY PERITONEAL DIALYSIS  (CAPD) CATHETER REMOVAL;  Surgeon: [name], MD;  [facility];  Service: General;  Laterality: N/A;  Removal  of CAPD catheter, Dr. requests to go after 100  . INSERTION OF DIALYSIS CATHETER  10/05/10   Right Femoral Cath insertion by Dr. [name].  Pt ahas had several caths inserted.  . IR FLUORO GUIDE CV LINE RIGHT  05/11/2018  . IR US GUIDE VASC ACCESS RIGHT  05/11/2018  . KIDNEY TRANSPLANT  2014  . UPPER EXTREMITY ANGIOGRAPHY Bilateral 05/13/2018   Procedure: UPPER EXTREMITY ANGIOGRAPHY - bilarteral;  Surgeon: Marty Heck, MD;  Location: [place] [facility];  Service: Cardiovascular;  Laterality: Bilateral;    There were no vitals filed for this visit.  Subjective Assessment - 02/23/19 1302    Subjective  feels a little tired, was sore after last treat. shoulder still feels the same    Currently in Pain?  No/denies    Pain Score  0-No pain                       [facility] Adult PT Treatment/Exercise - 02/23/19 0001      Ambulation/Gait   Stairs  Yes    Stairs Assistance  7: Independent    Stair Management Technique  No rails;One rail Right;Alternating pattern    Number of Stairs  96   running up and down steps.   Gait Comments  full lap around the outside of the clinic      Lumbar Exercises: Aerobic   Elliptical  I10 R5 x5 min       Lumbar Exercises: Machines for Strengthening   Leg Press  80# 2x15      Lumbar Exercises: Seated   Other Seated Lumbar Exercises  STS 2x10 with ball OHP      Shoulder Exercises: Seated   Other Seated Exercises  Seated rows, lat pulldown BIL 65# 2x10      Shoulder Exercises: Standing   External Rotation  Strengthening;Right;Theraband;20 reps    Theraband Level (Shoulder External Rotation)  Level 4 ([place])    Internal Rotation  Strengthening;Right;Theraband;20 reps    Theraband Level (Shoulder Internal Rotation)  Level 4 ([place])    Flexion  Both;Strengthening;Weights;10 reps    Shoulder Flexion Weight (lbs)  3    Flexion Limitations  shlder elevation of RT.     Extension  Both;Strengthening;Weights;20 reps   fatigue afterwards    Extension Weight (lbs)  15      Shoulder Exercises: ROM/Strengthening   UBE (Upper Arm Bike)  lvl 4 32fwd/2bck                  PT Long Term Goals - 02/23/19 1304      PT LONG TERM GOAL #3   Title  walk .75 miles    Baseline  pt reported walking two laps [place] equating 2 miles at least.    Status  Achieved      PT LONG TERM GOAL #4   Title  decrease TUG time to 11 seconds    Status  Achieved            Plan - 02/23/19 1339    Clinical Impression Statement  pt continuing to make progress toward goals and completing two,although fatigue is still present throughout the session pt able to continue to push himself and complete all exercises. Rt shoulder no better, intolerance of activites involving shoulder flxn and abd persists.    PT Treatment/Interventions  ADLs/Self Care Home Management;Gait training;Stair training;Functional mobility training;Neuromuscular re-education;Balance training;Therapeutic exercise;Therapeutic activities;Patient/family education;Manual techniques    PT Next Visit Plan  continue to push endurance and strength as tolerated       Patient will benefit from skilled therapeutic intervention in order to improve the following deficits and impairments:  Postural dysfunction, Cardiopulmonary status limiting activity, Decreased activity tolerance, Decreased endurance, Decreased range of motion, Decreased strength, Impaired UE functional use, Difficulty walking, Decreased balance  Visit Diagnosis: Muscle weakness (generalized)  Difficulty in walking, not elsewhere classified     Problem List Patient Active Problem List   Diagnosis Date Noted  . Labile blood glucose   . Anemia of chronic disease   . Debility 12/13/2018  . Diabetes mellitus type 2 in obese ([place])   . Medically noncompliant   . Pressure injury of skin 12/12/2018  . Sepsis ([place]) 11/30/2018  . Acute respiratory failure with hypoxia ([place]) 11/30/2018  . Acute  encephalopathy   . Septic shock ([place])   . Hematuria 11/03/2018  . Genital warts 11/03/2018  . Meralgia paresthetica of right side 08/13/2018  . ESRD on dialysis ([place]) 06/04/2018  . Acute kidney injury superimposed on CKD ([place]) 05/10/2018  . Acidosis 05/10/2018  . Anemia 05/10/2018  . Acute renal failure superimposed on stage 5 chronic kidney disease, not on chronic dialysis ([place])   . Type 2 diabetes mellitus without complication, without long-term current use of insulin ([place])  01/25/2018  . Essential hypertension 01/25/2017  . Well adult exam 01/17/2016  . Morbid obesity ([place]) 01/17/2016  . Delayed graft function of kidney transplant due to reason other than ATN or rejection requiring acute dialysis ([place]) 11/02/2012  . Immunosuppression ([place]) 11/02/2012  . Kidney replaced by transplant 11/02/2012  . OSA (obstructive sleep apnea) 05/26/2011    Dylan Shatia Sindoni,SPTA 02/23/2019, 1:42 PM  [place], [name], [address] Phone: [phone]   Fax:  [phone]  Name: [name] MRN: [id number] Date of Birth: [birth year]

## 2019-02-28 ENCOUNTER — Other Ambulatory Visit: Payer: Self-pay

## 2019-02-28 ENCOUNTER — Encounter: Payer: Self-pay | Admitting: Physical Therapy

## 2019-02-28 ENCOUNTER — Ambulatory Visit: Payer: 59 | Admitting: Physical Therapy

## 2019-02-28 DIAGNOSIS — M6281 Muscle weakness (generalized): Secondary | ICD-10-CM | POA: Diagnosis not present

## 2019-02-28 DIAGNOSIS — R262 Difficulty in walking, not elsewhere classified: Secondary | ICD-10-CM

## 2019-02-28 NOTE — Therapy (Signed)
[place] [place], [name], [address] Phone: [phone]   Fax:  [phone]  Physical Therapy Treatment  Patient Details  Name: [name] MRN: [id number] Date of Birth: [birth year] Referring Provider (PT): Vickey Sages Date: 02/28/2019  PT End of Session - 02/28/19 1342    Visit Number  15    Date for PT Re-Evaluation  02/22/19    PT Start Time  1300    PT Stop Time  1343    PT Time Calculation (min)  43 min    Activity Tolerance  Patient tolerated treatment well    Behavior During Therapy  [facility] for tasks assessed/performed       Past Medical History:  Diagnosis Date  . Anemia    ESRD  . CHF (congestive heart failure) ([place])   . Diabetes mellitus without complication ([place])   . ESRD on hemodialysis ([place]) 06/07/2011   TTS [employer]. Started HD in 2006, got transplant in 2014 lasted until Dec 2019 then went back on HD.  Has L thigh AVG as of Jun 2020.  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   Marland Kitchen GERD (gastroesophageal reflux disease)   . Hypertension 111/29/2012   pt states he takes no HTN meds  . Hypothyroidism, secondary   . Morbid obesity ([place])   . Paroxysmal atrial fibrillation (HCC)   . Sleep apnea 06/07/2011   Pt had sllep study done 2 weeks ago- Dr. Joellyn Quails.  Does not  have a CPAP at this time.    Past Surgical History:  Procedure Laterality Date  . ARTERIOVENOUS GRAFT PLACEMENT  08/09/10   Left Thigh Graft by Dr. [name]  . AV FISTULA PLACEMENT Right 05/18/2018   Procedure: INSERTION OF ARTERIOVENOUS (AV) GORE-TEX GRAFT Left THIGH;  Surgeon: Waynetta Sandy, MD;  Location: [place];  Service: Vascular;  Laterality: Right;  . CAPD REMOVAL  05/08/2011   Procedure: CONTINUOUS AMBULATORY PERITONEAL DIALYSIS  (CAPD) CATHETER REMOVAL;  Surgeon: [name], MD;  [facility];  Service: General;  Laterality: N/A;  Removal of CAPD catheter, Dr.  requests to go after 100  . INSERTION OF DIALYSIS CATHETER  10/05/10   Right Femoral Cath insertion by Dr. [name].  Pt ahas had several caths inserted.  . IR FLUORO GUIDE CV LINE RIGHT  05/11/2018  . IR US GUIDE VASC ACCESS RIGHT  05/11/2018  . KIDNEY TRANSPLANT  2014  . UPPER EXTREMITY ANGIOGRAPHY Bilateral 05/13/2018   Procedure: UPPER EXTREMITY ANGIOGRAPHY - bilarteral;  Surgeon: Marty Heck, MD;  Location: [place] [facility];  Service: Cardiovascular;  Laterality: Bilateral;    There were no vitals filed for this visit.  Subjective Assessment - 02/28/19 1302    Subjective  "Just wonderful"    Patient Stated Goals  would like to be able to get back to work, be able to be strong again    Currently in Pain?  No/denies    Pain Score  0-No pain                       [facility] Adult PT Treatment/Exercise - 02/28/19 0001      Ambulation/Gait   Stairs  Yes    Stair Management Technique  No rails    Number of Stairs  72    Gait Comments  10lb dumbbells farmers carry       Lumbar Exercises: Aerobic  Elliptical  I10 R5 x5 min       Lumbar Exercises: Machines for Strengthening   Leg Press  80# 2x15      Lumbar Exercises: Standing   Shoulder Extension  Theraband;20 reps;Both;Strengthening    Shoulder Extension Limitations  15      Lumbar Exercises: Seated   Other Seated Lumbar Exercises  STS 2x10 with blue 8lb ball OHP    Other Seated Lumbar Exercises  ab sets 3sec 2x10       Shoulder Exercises: Seated   Other Seated Exercises  Seated rows, lat pulldown BIL 65# 2x10      Shoulder Exercises: Standing   External Rotation  Strengthening;Right;Theraband;20 reps    Theraband Level (Shoulder External Rotation)  Level 2 ([place])    Internal Rotation  Strengthening;Right;Theraband;20 reps    Theraband Level (Shoulder Internal Rotation)  Level 4 ([place])      Shoulder Exercises: ROM/Strengthening   Other ROM/Strengthening Exercises  Chest press 35lb 2x15                   PT Long Term Goals - 02/28/19 1343      PT LONG TERM GOAL #1   Title  increase shoulder ROM to 140 degrees flexion    Status  On-going      PT LONG TERM GOAL #2   Title  increase LE strength to 4+/5    Status  On-going      PT LONG TERM GOAL #3   Title  walk .75 miles    Status  Achieved      PT LONG TERM GOAL #4   Title  decrease TUG time to 11 seconds    Status  Achieved            Plan - 02/28/19 1343    Clinical Impression Statement  Pt did well overall, He did fatigue  more functional interventions such as stair negotiation and with sit to stands. No reports of pain. Pt is very weak with R shoulder external rotation. He does state tat he thinks his shoulder is shot from a previous injury.    Stability/Clinical Decision Making  Evolving/Moderate complexity    Rehab Potential  Good    PT Frequency  2x / week    PT Duration  8 weeks    PT Treatment/Interventions  ADLs/Self Care Home Management;Gait training;Stair training;Functional mobility training;Neuromuscular re-education;Balance training;Therapeutic exercise;Therapeutic activities;Patient/family education;Manual techniques    PT Next Visit Plan  continue to push endurance and strength as tolerated       Patient will benefit from skilled therapeutic intervention in order to improve the following deficits and impairments:  Postural dysfunction, Cardiopulmonary status limiting activity, Decreased activity tolerance, Decreased endurance, Decreased range of motion, Decreased strength, Impaired UE functional use, Difficulty walking, Decreased balance  Visit Diagnosis: Difficulty in walking, not elsewhere classified  Muscle weakness (generalized)     Problem List Patient Active Problem List   Diagnosis Date Noted  . Labile blood glucose   . Anemia of chronic disease   . Debility 12/13/2018  . Diabetes mellitus type 2 in obese ([place])   . Medically noncompliant   . Pressure injury of  skin 12/12/2018  . Sepsis ([place]) 11/30/2018  . Acute respiratory failure with hypoxia ([place]) 11/30/2018  . Acute encephalopathy   . Septic shock ([place])   . Hematuria 11/03/2018  . Genital warts 11/03/2018  . Meralgia paresthetica of right side 08/13/2018  . ESRD on dialysis ([place]) 06/04/2018  . Acute  kidney injury superimposed on CKD ([place]) 05/10/2018  . Acidosis 05/10/2018  . Anemia 05/10/2018  . Acute renal failure superimposed on stage 5 chronic kidney disease, not on chronic dialysis ([place])   . Type 2 diabetes mellitus without complication, without long-term current use of insulin ([place]) 01/25/2018  . Essential hypertension 01/25/2017  . Well adult exam 01/17/2016  . Morbid obesity ([place]) 01/17/2016  . Delayed graft function of kidney transplant due to reason other than ATN or rejection requiring acute dialysis ([place]) 11/02/2012  . Immunosuppression ([place]) 11/02/2012  . Kidney replaced by transplant 11/02/2012  . OSA (obstructive sleep apnea) 05/26/2011    Scot Jun, PTA 02/28/2019, 1:45 PM  [place], [name], [address] Phone: [phone]   Fax:  [phone]  Name: [name] MRN: [id number] Date of Birth: [birth year]

## 2019-03-02 ENCOUNTER — Encounter: Payer: Self-pay | Admitting: Physical Therapy

## 2019-03-02 ENCOUNTER — Other Ambulatory Visit: Payer: Self-pay

## 2019-03-02 ENCOUNTER — Ambulatory Visit: Payer: 59 | Admitting: Physical Therapy

## 2019-03-02 DIAGNOSIS — M6281 Muscle weakness (generalized): Secondary | ICD-10-CM | POA: Diagnosis not present

## 2019-03-02 DIAGNOSIS — R262 Difficulty in walking, not elsewhere classified: Secondary | ICD-10-CM

## 2019-03-02 NOTE — Therapy (Signed)
[place], [name], [address] Phone: [phone]   Fax:  [phone]  Physical Therapy Treatment  Patient Details  Name: [name] MRN: [id number] Date of Birth: [birth year] Referring Provider (PT): Vickey Sages Date: 03/02/2019  PT End of Session - 03/02/19 1418    Visit Number  16    Date for PT Re-Evaluation  02/22/19    PT Start Time  1345    PT Stop Time  1425    PT Time Calculation (min)  40 min    Activity Tolerance  Patient tolerated treatment well    Behavior During Therapy  [facility] for tasks assessed/performed       Past Medical History:  Diagnosis Date  . Anemia    ESRD  . CHF (congestive heart failure) ([place])   . Diabetes mellitus without complication ([place])   . ESRD on hemodialysis ([place]) 06/07/2011   TTS [employer]. Started HD in 2006, got transplant in 2014 lasted until Dec 2019 then went back on HD.  Has L thigh AVG as of Jun 2020.  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   Marland Kitchen GERD (gastroesophageal reflux disease)   . Hypertension 111/29/2012   pt states he takes no HTN meds  . Hypothyroidism, secondary   . Morbid obesity ([place])   . Paroxysmal atrial fibrillation (HCC)   . Sleep apnea 06/07/2011   Pt had sllep study done 2 weeks ago- Dr. Joellyn Quails.  Does not  have a CPAP at this time.    Past Surgical History:  Procedure Laterality Date  . ARTERIOVENOUS GRAFT PLACEMENT  08/09/10   Left Thigh Graft by Dr. [name]  . AV FISTULA PLACEMENT Right 05/18/2018   Procedure: INSERTION OF ARTERIOVENOUS (AV) GORE-TEX GRAFT Left THIGH;  Surgeon: Waynetta Sandy, MD;  Location: [place] [facility];  Service: Vascular;  Laterality: Right;  . CAPD REMOVAL  05/08/2011   Procedure: CONTINUOUS AMBULATORY PERITONEAL DIALYSIS  (CAPD) CATHETER REMOVAL;  Surgeon: [name], MD;  [facility];  Service: General;  Laterality: N/A;  Removal of CAPD catheter, Dr.  requests to go after 100  . INSERTION OF DIALYSIS CATHETER  10/05/10   Right Femoral Cath insertion by Dr. [name].  Pt ahas had several caths inserted.  . IR FLUORO GUIDE CV LINE RIGHT  05/11/2018  . IR US GUIDE VASC ACCESS RIGHT  05/11/2018  . KIDNEY TRANSPLANT  2014  . UPPER EXTREMITY ANGIOGRAPHY Bilateral 05/13/2018   Procedure: UPPER EXTREMITY ANGIOGRAPHY - bilarteral;  Surgeon: Marty Heck, MD;  Location: [facility];  Service: Cardiovascular;  Laterality: Bilateral;    There were no vitals filed for this visit.  Subjective Assessment - 03/02/19 1348    Subjective  "Tired, I went walking this morning"    Currently in Pain?  No/denies    Pain Score  0-No pain         [facility] PT Assessment - 03/02/19 0001      Strength   Overall Strength Comments  hips 3+/5 , knees 4-/5, ankles 4/5                   [facility] Adult PT Treatment/Exercise - 03/02/19 0001      Ambulation/Gait   Stairs  Yes    Stair Management Technique  No rails    Number of Stairs  36    Gait Comments  5lb dumbbells farmers carry,  outside up hill bicepts curls, downhall OHP       Lumbar Exercises: Aerobic   Elliptical  I13 R7 x6 min       Lumbar Exercises: Machines for Strengthening   Leg Press  100lb 3x10       Knee/Hip Exercises: Machines for Strengthening   Cybex Knee Extension  25lb 2x15    Cybex Knee Flexion  55lb 2x15      Shoulder Exercises: Seated   Other Seated Exercises  Seated rows, lat pulldown BIL 65# 2x10    Other Seated Exercises  chest press 25lb 2x10                   PT Long Term Goals - 03/02/19 1426      PT LONG TERM GOAL #1   Title  increase shoulder ROM to 140 degrees flexion    Status  On-going      PT LONG TERM GOAL #2   Title  increase LE strength to 4+/5    Status  Partially Met      PT LONG TERM GOAL #3   Title  walk .75 miles    Status  Achieved      PT LONG TERM GOAL #4   Title  decrease TUG time to 11 seconds    Status   Achieved            Plan - 03/02/19 1423    Clinical Impression Statement  Despite reports of fatigue pt was able to complete all of today interventions. Some R shoulder pain reported with chest press. Ascending stairs and uphill ambulation was really taxing on pt. Increase weight tolerated on leg press but rest needed in each set.    Stability/Clinical Decision Making  Evolving/Moderate complexity    Rehab Potential  Good    PT Frequency  2x / week    PT Duration  8 weeks    PT Treatment/Interventions  ADLs/Self Care Home Management;Gait training;Stair training;Functional mobility training;Neuromuscular re-education;Balance training;Therapeutic exercise;Therapeutic activities;Patient/family education;Manual techniques       Patient will benefit from skilled therapeutic intervention in order to improve the following deficits and impairments:  Postural dysfunction, Cardiopulmonary status limiting activity, Decreased activity tolerance, Decreased endurance, Decreased range of motion, Decreased strength, Impaired UE functional use, Difficulty walking, Decreased balance  Visit Diagnosis: Difficulty in walking, not elsewhere classified  Muscle weakness (generalized)     Problem List Patient Active Problem List   Diagnosis Date Noted  . Labile blood glucose   . Anemia of chronic disease   . Debility 12/13/2018  . Diabetes mellitus type 2 in obese ([place])   . Medically noncompliant   . Pressure injury of skin 12/12/2018  . Sepsis ([place]) 11/30/2018  . Acute respiratory failure with hypoxia ([place]) 11/30/2018  . Acute encephalopathy   . Septic shock ([place])   . Hematuria 11/03/2018  . Genital warts 11/03/2018  . Meralgia paresthetica of right side 08/13/2018  . ESRD on dialysis ([place]) 06/04/2018  . Acute kidney injury superimposed on CKD ([place]) 05/10/2018  . Acidosis 05/10/2018  . Anemia 05/10/2018  . Acute renal failure superimposed on stage 5 chronic kidney disease, not on chronic  dialysis ([place])   . Type 2 diabetes mellitus without complication, without long-term current use of insulin ([place]) 01/25/2018  . Essential hypertension 01/25/2017  . Well adult exam 01/17/2016  . Morbid obesity ([place]) 01/17/2016  . Delayed graft function of kidney transplant due to reason other than ATN or rejection requiring acute  dialysis ([place]) 11/02/2012  . Immunosuppression ([place]) 11/02/2012  . Kidney replaced by transplant 11/02/2012  . OSA (obstructive sleep apnea) 05/26/2011    Scot Jun 03/02/2019, 2:29 PM  [place], [name], [address] Phone: [phone]   Fax:  [phone]  Name: [name] MRN: [id number] Date of Birth: 15-Nov-1975

## 2019-03-02 NOTE — Addendum Note (Signed)
Addended by: Sumner Boast on: 03/02/2019 05:31 PM   Modules accepted: Orders

## 2019-03-07 ENCOUNTER — Ambulatory Visit: Payer: 59 | Admitting: Physical Therapy

## 2019-03-07 ENCOUNTER — Encounter: Payer: Self-pay | Admitting: Physical Therapy

## 2019-03-07 ENCOUNTER — Other Ambulatory Visit: Payer: Self-pay

## 2019-03-07 DIAGNOSIS — M6281 Muscle weakness (generalized): Secondary | ICD-10-CM | POA: Diagnosis not present

## 2019-03-07 DIAGNOSIS — R262 Difficulty in walking, not elsewhere classified: Secondary | ICD-10-CM

## 2019-03-07 NOTE — Therapy (Signed)
[place], [name], [address] Phone: [phone]   Fax:  [phone]  Physical Therapy Treatment  Patient Details  Name: [name] MRN: [id number] Date of Birth: September 10, 1975 Referring Provider (PT): Vickey Sages Date: 03/07/2019  PT End of Session - 03/07/19 1339    Visit Number  17    Number of Visits  26    Date for PT Re-Evaluation  04/01/19    PT Start Time  2595    PT Stop Time  1341    PT Time Calculation (min)  42 min    Activity Tolerance  Patient tolerated treatment well    Behavior During Therapy  [facility] for tasks assessed/performed       Past Medical History:  Diagnosis Date  . Anemia    ESRD  . CHF (congestive heart failure) ([place])   . Diabetes mellitus without complication ([place])   . ESRD on hemodialysis ([place]) 06/07/2011   TTS [employer]. Started HD in 2006, got transplant in 2014 lasted until Dec 2019 then went back on HD.  Has L thigh AVG as of Jun 2020.  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   Marland Kitchen GERD (gastroesophageal reflux disease)   . Hypertension 111/29/2012   pt states he takes no HTN meds  . Hypothyroidism, secondary   . Morbid obesity ([place])   . Paroxysmal atrial fibrillation (HCC)   . Sleep apnea 06/07/2011   Pt had sllep study done 2 weeks ago- Dr. Joellyn Quails.  Does not  have a CPAP at this time.    Past Surgical History:  Procedure Laterality Date  . ARTERIOVENOUS GRAFT PLACEMENT  08/09/10   Left Thigh Graft by Dr. [name]  . AV FISTULA PLACEMENT Right 05/18/2018   Procedure: INSERTION OF ARTERIOVENOUS (AV) GORE-TEX GRAFT Left THIGH;  Surgeon: Waynetta Sandy, MD;  Location: [facility];  Service: Vascular;  Laterality: Right;  . CAPD REMOVAL  05/08/2011   Procedure: CONTINUOUS AMBULATORY PERITONEAL DIALYSIS  (CAPD) CATHETER REMOVAL;  Surgeon: [name], MD;  [facility];  Service: General;  Laterality: N/A;  Removal  of CAPD catheter, Dr. requests to go after 100  . INSERTION OF DIALYSIS CATHETER  10/05/10   Right Femoral Cath insertion by Dr. [name].  Pt ahas had several caths inserted.  . IR FLUORO GUIDE CV LINE RIGHT  05/11/2018  . IR US GUIDE VASC ACCESS RIGHT  05/11/2018  . KIDNEY TRANSPLANT  2014  . UPPER EXTREMITY ANGIOGRAPHY Bilateral 05/13/2018   Procedure: UPPER EXTREMITY ANGIOGRAPHY - bilarteral;  Surgeon: Marty Heck, MD;  Location: [facility];  Service: Cardiovascular;  Laterality: Bilateral;    There were no vitals filed for this visit.  Subjective Assessment - 03/07/19 1303    Subjective  "It is coming" "I am just weak"    Currently in Pain?  No/denies                       [facility] Adult PT Treatment/Exercise - 03/07/19 0001      Ambulation/Gait   Ambulation/Gait  Yes    Stairs  Yes    Stair Management Technique  No rails    Number of Stairs  48    Gait Comments  2 flights of stairs 2 laps around building.       Lumbar Exercises: Aerobic   Nustep  L6 x 6 min  Lumbar Exercises: Machines for Strengthening   Leg Press  100lb 3x15       Knee/Hip Exercises: Standing   Forward Step Up  Both;2 sets;10 reps;Hand Hold: 0;Step Height: 8"      Shoulder Exercises: Seated   Other Seated Exercises  Seated rows, lat pulldown BIL 75# 2x15    Other Seated Exercises  chest press 25lb 2x10       Shoulder Exercises: Standing   Flexion  Both;Strengthening;Weights;10 reps    Shoulder Flexion Weight (lbs)  3    Other Standing Exercises  OHP 4lb 2x10    Other Standing Exercises  triceps ext 45lb 2x15, Biceps curls 25lb 2x15                   PT Long Term Goals - 03/02/19 1426      PT LONG TERM GOAL #1   Title  increase shoulder ROM to 140 degrees flexion    Status  On-going      PT LONG TERM GOAL #2   Title  increase LE strength to 4+/5    Status  Partially Met      PT LONG TERM GOAL #3   Title  walk .75 miles    Status  Achieved       PT LONG TERM GOAL #4   Title  decrease TUG time to 11 seconds    Status  Achieved            Plan - 03/07/19 1340    Clinical Impression Statement  Pt with good effort with today's interventions. He was able to complete all of the activities but does fatigue quick. He reports that he would not be able to return to work at his current functional status. R shoulder is very weak and limited with motion. Again pt reports that this is from previous injury that went untreated    Stability/Clinical Decision Making  Evolving/Moderate complexity    Rehab Potential  Good    PT Frequency  2x / week    PT Duration  8 weeks    PT Treatment/Interventions  ADLs/Self Care Home Management;Gait training;Stair training;Functional mobility training;Neuromuscular re-education;Balance training;Therapeutic exercise;Therapeutic activities;Patient/family education;Manual techniques    PT Next Visit Plan  continue to push endurance and strength as tolerated       Patient will benefit from skilled therapeutic intervention in order to improve the following deficits and impairments:  Postural dysfunction, Cardiopulmonary status limiting activity, Decreased activity tolerance, Decreased endurance, Decreased range of motion, Decreased strength, Impaired UE functional use, Difficulty walking, Decreased balance  Visit Diagnosis: Muscle weakness (generalized)  Difficulty in walking, not elsewhere classified     Problem List Patient Active Problem List   Diagnosis Date Noted  . Labile blood glucose   . Anemia of chronic disease   . Debility 12/13/2018  . Diabetes mellitus type 2 in obese ([place])   . Medically noncompliant   . Pressure injury of skin 12/12/2018  . Sepsis ([place]) 11/30/2018  . Acute respiratory failure with hypoxia ([place]) 11/30/2018  . Acute encephalopathy   . Septic shock ([place])   . Hematuria 11/03/2018  . Genital warts 11/03/2018  . Meralgia paresthetica of right side 08/13/2018  . ESRD  on dialysis ([place]) 06/04/2018  . Acute kidney injury superimposed on CKD ([place]) 05/10/2018  . Acidosis 05/10/2018  . Anemia 05/10/2018  . Acute renal failure superimposed on stage 5 chronic kidney disease, not on chronic dialysis ([place])   . Type 2 diabetes mellitus without  complication, without long-term current use of insulin ([place]) 01/25/2018  . Essential hypertension 01/25/2017  . Well adult exam 01/17/2016  . Morbid obesity ([place]) 01/17/2016  . Delayed graft function of kidney transplant due to reason other than ATN or rejection requiring acute dialysis ([place]) 11/02/2012  . Immunosuppression ([place]) 11/02/2012  . Kidney replaced by transplant 11/02/2012  . OSA (obstructive sleep apnea) 05/26/2011    Scot Jun, PTA 03/07/2019, 1:44 PM  [place], [name], [address] Phone: [phone]   Fax:  [phone]  Name: JORDEN MINCHEY MRN: [id number] Date of Birth: 10-02-1975

## 2019-03-09 ENCOUNTER — Ambulatory Visit: Payer: 59 | Admitting: Physical Therapy

## 2019-03-09 ENCOUNTER — Other Ambulatory Visit: Payer: Self-pay

## 2019-03-09 ENCOUNTER — Encounter: Payer: Self-pay | Admitting: Physical Therapy

## 2019-03-09 DIAGNOSIS — R262 Difficulty in walking, not elsewhere classified: Secondary | ICD-10-CM

## 2019-03-09 DIAGNOSIS — M6281 Muscle weakness (generalized): Secondary | ICD-10-CM | POA: Diagnosis not present

## 2019-03-09 NOTE — Therapy (Signed)
[name], [name], [address] Phone: [phone]   Fax:  [phone]  Physical Therapy Treatment  Patient Details  Name: [name] MRN: [id number] Date of Birth: [birth year] Referring Provider (PT): Vickey Sages Date: 03/09/2019  PT End of Session - 03/09/19 1428    Visit Number  18    Date for PT Re-Evaluation  04/01/19    PT Start Time  1350    PT Stop Time  1430    PT Time Calculation (min)  40 min    Activity Tolerance  Patient tolerated treatment well    Behavior During Therapy  [facility] for tasks assessed/performed       Past Medical History:  Diagnosis Date  . Anemia    ESRD  . CHF (congestive heart failure) ([place])   . Diabetes mellitus without complication ([place])   . ESRD on hemodialysis ([place]) 06/07/2011   TTS [employer]. Started HD in 2006, got transplant in 2014 lasted until Dec 2019 then went back on HD.  Has L thigh AVG as of Jun 2020.  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   Marland Kitchen GERD (gastroesophageal reflux disease)   . Hypertension 111/29/2012   pt states he takes no HTN meds  . Hypothyroidism, secondary   . Morbid obesity ([place])   . Paroxysmal atrial fibrillation (HCC)   . Sleep apnea 06/07/2011   Pt had sllep study done 2 weeks ago- Dr. Joellyn Quails.  Does not  have a CPAP at this time.    Past Surgical History:  Procedure Laterality Date  . ARTERIOVENOUS GRAFT PLACEMENT  08/09/10   Left Thigh Graft by Dr. [name]  . AV FISTULA PLACEMENT Right 05/18/2018   Procedure: INSERTION OF ARTERIOVENOUS (AV) GORE-TEX GRAFT Left THIGH;  Surgeon: Waynetta Sandy, MD;  Location: [facility];  Service: Vascular;  Laterality: Right;  . CAPD REMOVAL  05/08/2011   Procedure: CONTINUOUS AMBULATORY PERITONEAL DIALYSIS  (CAPD) CATHETER REMOVAL;  Surgeon: [name], MD;  [facility];  Service: General;  Laterality: N/A;  Removal of CAPD catheter, Dr.  requests to go after 100  . INSERTION OF DIALYSIS CATHETER  10/05/10   Right Femoral Cath insertion by Dr. [name].  Pt ahas had several caths inserted.  . IR FLUORO GUIDE CV LINE RIGHT  05/11/2018  . IR US GUIDE VASC ACCESS RIGHT  05/11/2018  . KIDNEY TRANSPLANT  2014  . UPPER EXTREMITY ANGIOGRAPHY Bilateral 05/13/2018   Procedure: UPPER EXTREMITY ANGIOGRAPHY - bilarteral;  Surgeon: Marty Heck, MD;  Location: [facility];  Service: Cardiovascular;  Laterality: Bilateral;    There were no vitals filed for this visit.  Subjective Assessment - 03/09/19 1354    Subjective  "It is going"    Currently in Pain?  No/denies                       [facility] Adult PT Treatment/Exercise - 03/09/19 0001      Ambulation/Gait   Stairs  Yes    Stair Management Technique  No rails    Number of Stairs  48    Gait Comments  2 flights of stairs skipping a step on the way up      Lumbar Exercises: Aerobic   Elliptical  I13 R7 x4 min       Lumbar Exercises: Machines for Strengthening   Leg Press  100lb  3x15       Shoulder Exercises: Seated   Other Seated Exercises  Seated rows, lat pulldown BIL 75# 2x15    Other Seated Exercises  chest press 25lb 2x15      Shoulder Exercises: Standing   Other Standing Exercises  Bilat flex cane 2lb cuff 2x10    Other Standing Exercises  triceps ext 45lb 2x15, Biceps curls 25lb 2x15       Shoulder Exercises: ROM/Strengthening   UBE (Upper Arm Bike)  lvl 4 24fd/2bck                  PT Long Term Goals - 03/09/19 1429      PT LONG TERM GOAL #1   Title  increase shoulder ROM to 140 degrees flexion    Status  On-going      PT LONG TERM GOAL #2   Title  increase LE strength to 4+/5    Status  Partially Met      PT LONG TERM GOAL #3   Title  walk .75 miles    Status  Achieved      PT LONG TERM GOAL #4   Title  decrease TUG time to 11 seconds    Status  Achieved            Plan - 03/09/19 1430     Clinical Impression Statement  Pt demos signs of fatigue with today's interventions. He does report that his shoulder is the main limiting factor keeping him from doing his job. He did do better with bilat shoulder flexion using cane. Every other step ascending stairs was very taxing on pt, using heavy forward lean.    Stability/Clinical Decision Making  Evolving/Moderate complexity    Rehab Potential  Good    PT Frequency  2x / week    PT Duration  8 weeks    PT Treatment/Interventions  ADLs/Self Care Home Management;Gait training;Stair training;Functional mobility training;Neuromuscular re-education;Balance training;Therapeutic exercise;Therapeutic activities;Patient/family education;Manual techniques    PT Next Visit Plan  continue to push endurance and strength as tolerated       Patient will benefit from skilled therapeutic intervention in order to improve the following deficits and impairments:  Postural dysfunction, Cardiopulmonary status limiting activity, Decreased activity tolerance, Decreased endurance, Decreased range of motion, Decreased strength, Impaired UE functional use, Difficulty walking, Decreased balance  Visit Diagnosis: Difficulty in walking, not elsewhere classified  Muscle weakness (generalized)     Problem List Patient Active Problem List   Diagnosis Date Noted  . Labile blood glucose   . Anemia of chronic disease   . Debility 12/13/2018  . Diabetes mellitus type 2 in obese ([place]   . Medically noncompliant   . Pressure injury of skin 12/12/2018  . Sepsis ([place] 11/30/2018  . Acute respiratory failure with hypoxia ([place] 11/30/2018  . Acute encephalopathy   . Septic shock ([place]   . Hematuria 11/03/2018  . Genital warts 11/03/2018  . Meralgia paresthetica of right side 08/13/2018  . ESRD on dialysis ([place] 06/04/2018  . Acute kidney injury superimposed on CKD ([place] 05/10/2018  . Acidosis 05/10/2018  . Anemia 05/10/2018  . Acute renal failure superimposed  on stage 5 chronic kidney disease, not on chronic dialysis ([place]   . Type 2 diabetes mellitus without complication, without long-term current use of insulin ([place] 01/25/2018  . Essential hypertension 01/25/2017  . Well adult exam 01/17/2016  . Morbid obesity ([place] 01/17/2016  . Delayed graft function of kidney transplant due to reason  other than ATN or rejection requiring acute dialysis ([place]) 11/02/2012  . Immunosuppression ([place]) 11/02/2012  . Kidney replaced by transplant 11/02/2012  . OSA (obstructive sleep apnea) 05/26/2011    Scot Jun, PTA 03/09/2019, 2:32 PM  [place], [name], [address] Phone: [phone]   Fax:  [phone]  Name: ELLA GUILLOTTE MRN: [id number] Date of Birth: [birth year]

## 2019-03-14 ENCOUNTER — Other Ambulatory Visit: Payer: Self-pay

## 2019-03-14 ENCOUNTER — Encounter: Payer: Self-pay | Admitting: Physical Therapy

## 2019-03-14 ENCOUNTER — Ambulatory Visit: Payer: 59 | Attending: Physical Medicine and Rehabilitation | Admitting: Physical Therapy

## 2019-03-14 DIAGNOSIS — R262 Difficulty in walking, not elsewhere classified: Secondary | ICD-10-CM | POA: Diagnosis not present

## 2019-03-14 DIAGNOSIS — M6281 Muscle weakness (generalized): Secondary | ICD-10-CM | POA: Diagnosis present

## 2019-03-14 NOTE — Therapy (Signed)
[name], [name], [address] Phone: [phone]   Fax:  [phone]  Physical Therapy Treatment  Patient Details  Name: [name] MRN: [id number] Date of Birth: June 01, 1976 Referring Provider (PT): Vickey Sages Date: 03/14/2019  PT End of Session - 03/14/19 1331    Visit Number  19    Number of Visits  26    Date for PT Re-Evaluation  04/01/19    PT Start Time  1300    PT Stop Time  1330    PT Time Calculation (min)  30 min    Activity Tolerance  Patient tolerated treatment well    Behavior During Therapy  [facility] for tasks assessed/performed       Past Medical History:  Diagnosis Date  . Anemia    ESRD  . CHF (congestive heart failure) ([place])   . Diabetes mellitus without complication ([place])   . ESRD on hemodialysis ([place]) 06/07/2011   TTS [employer]. Started HD in 2006, got transplant in 2014 lasted until Dec 2019 then went back on HD.  Has L thigh AVG as of Jun 2020.  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   Marland Kitchen GERD (gastroesophageal reflux disease)   . Hypertension 111/29/2012   pt states he takes no HTN meds  . Hypothyroidism, secondary   . Morbid obesity ([place])   . Paroxysmal atrial fibrillation (H[facility])   . Sleep apnea 06/07/2011   Pt had sllep study done 2 weeks ago- Dr. Joellyn Quails.  Does not  have a CPAP at this time.    Past Surgical History:  Procedure Laterality Date  . ARTERIOVENOUS GRAFT PLACEMENT  08/09/10   Left Thigh Graft by Dr. [name]  . AV FISTULA PLACEMENT Right 05/18/2018   Procedure: INSERTION OF ARTERIOVENOUS (AV) GORE-TEX GRAFT Left THIGH;  Surgeon: Waynetta Sandy, MD;  Location: [facility];  Service: Vascular;  Laterality: Right;  . CAPD REMOVAL  05/08/2011   Procedure: CONTINUOUS AMBULATORY PERITONEAL DIALYSIS  (CAPD) CATHETER REMOVAL;  Surgeon: [name], MD;  [facility];  Service: General;  Laterality: N/A;  Removal  of CAPD catheter, Dr. requests to go after 100  . INSERTION OF DIALYSIS CATHETER  10/05/10   Right Femoral Cath insertion by Dr. [name].  Pt ahas had several caths inserted.  . IR FLUORO GUIDE CV LINE RIGHT  05/11/2018  . IR US GUIDE VASC ACCESS RIGHT  05/11/2018  . KIDNEY TRANSPLANT  2014  . UPPER EXTREMITY ANGIOGRAPHY Bilateral 05/13/2018   Procedure: UPPER EXTREMITY ANGIOGRAPHY - bilarteral;  Surgeon: Marty Heck, MD;  Location: [facility];  Service: Cardiovascular;  Laterality: Bilateral;    There were no vitals filed for this visit.  Subjective Assessment - 03/14/19 1302    Subjective  "Let's just get this over with"    Currently in Pain?  No/denies    Pain Score  0-No pain                       [facility] Adult PT Treatment/Exercise - 03/14/19 0001      Lumbar Exercises: Aerobic   Elliptical  I13 R7 x3 min each way       Lumbar Exercises: Seated   Other Seated Lumbar Exercises  STS 2x10 with yellow ball chest press      Knee/Hip Exercises: Machines for Strengthening   Cybex Knee Extension  25lb 2x15  Cybex Knee Flexion  55lb 2x15      Shoulder Exercises: Seated   Other Seated Exercises  Seated rows 75lb, lat pulldown 65lb  BIL 2x15    Other Seated Exercises  chest press 25lb 2x15      Shoulder Exercises: Standing   Flexion  Both;AROM;15 reps    Other Standing Exercises  Functional box rotations, siimulating work activity 2x10     Other Standing Exercises  Bilat Flex with cane 2lb 2x10                  PT Long Term Goals - 03/09/19 1429      PT LONG TERM GOAL #1   Title  increase shoulder ROM to 140 degrees flexion    Status  On-going      PT LONG TERM GOAL #2   Title  increase LE strength to 4+/5    Status  Partially Met      PT LONG TERM GOAL #3   Title  walk .75 miles    Status  Achieved      PT LONG TERM GOAL #4   Title  decrease TUG time to 11 seconds    Status  Achieved            Plan - 03/14/19  1337    Clinical Impression Statement  Pt enters clinic reporting increase fatigue. Shoulder treatment session due to pt "not really feeling it today". His RUE remained his most limiting factor. Progressed to some work simulation exercises, he was able to complete but had some R shoulder pain. His LE did fatigue quick today.    Stability/Clinical Decision Making  Evolving/Moderate complexity    Rehab Potential  Good    PT Frequency  2x / week    PT Duration  8 weeks    PT Treatment/Interventions  ADLs/Self Care Home Management;Gait training;Stair training;Functional mobility training;Neuromuscular re-education;Balance training;Therapeutic exercise;Therapeutic activities;Patient/family education;Manual techniques    PT Next Visit Plan  continue to push endurance and strength as tolerated, Work simulation       Patient will benefit from skilled therapeutic intervention in order to improve the following deficits and impairments:  Postural dysfunction, Cardiopulmonary status limiting activity, Decreased activity tolerance, Decreased endurance, Decreased range of motion, Decreased strength, Impaired UE functional use, Difficulty walking, Decreased balance  Visit Diagnosis: Difficulty in walking, not elsewhere classified  Muscle weakness (generalized)     Problem List Patient Active Problem List   Diagnosis Date Noted  . Labile blood glucose   . Anemia of chronic disease   . Debility 12/13/2018  . Diabetes mellitus type 2 in obese ([place])   . Medically noncompliant   . Pressure injury of skin 12/12/2018  . Sepsis ([place]) 11/30/2018  . Acute respiratory failure with hypoxia ([place]) 11/30/2018  . Acute encephalopathy   . Septic shock ([place])   . Hematuria 11/03/2018  . Genital warts 11/03/2018  . Meralgia paresthetica of right side 08/13/2018  . ESRD on dialysis ([place]) 06/04/2018  . Acute kidney injury superimposed on CKD ([place]) 05/10/2018  . Acidosis 05/10/2018  . Anemia 05/10/2018  . Acute  renal failure superimposed on stage 5 chronic kidney disease, not on chronic dialysis ([place])   . Type 2 diabetes mellitus without complication, without long-term current use of insulin ([place]) 01/25/2018  . Essential hypertension 01/25/2017  . Well adult exam 01/17/2016  . Morbid obesity ([place]) 01/17/2016  . Delayed graft function of kidney transplant due to reason other than ATN or rejection requiring  acute dialysis ([place]) 11/02/2012  . Immunosuppression ([place]) 11/02/2012  . Kidney replaced by transplant 11/02/2012  . OSA (obstructive sleep apnea) 05/26/2011    Scot Jun 03/14/2019, 1:38 PM  [place], [name], [address] Phone: [phone]   Fax:  [phone]  Name: [name] MRN: [id number] Date of Birth: 1975/12/10

## 2019-03-16 ENCOUNTER — Encounter: Payer: Self-pay | Admitting: Physical Therapy

## 2019-03-16 ENCOUNTER — Ambulatory Visit: Payer: 59 | Admitting: Physical Therapy

## 2019-03-16 ENCOUNTER — Other Ambulatory Visit: Payer: Self-pay

## 2019-03-16 DIAGNOSIS — M6281 Muscle weakness (generalized): Secondary | ICD-10-CM

## 2019-03-16 DIAGNOSIS — R262 Difficulty in walking, not elsewhere classified: Secondary | ICD-10-CM | POA: Diagnosis not present

## 2019-03-16 NOTE — Therapy (Signed)
[facility] [place], [name], [address] Phone: [phone]   Fax:  [phone]  Physical Therapy Treatment  Patient Details  Name: [name] MRN: [id number] Date of Birth: Nov 27, 1975 Referring Provider (PT): Vickey Sages Date: 03/16/2019  PT End of Session - 03/16/19 1344    Visit Number  20    Date for PT Re-Evaluation  04/01/19    PT Start Time  1300    PT Stop Time  1345    PT Time Calculation (min)  45 min       Past Medical History:  Diagnosis Date  . Anemia    ESRD  . CHF (congestive heart failure) ([place])   . Diabetes mellitus without complication ([place])   . ESRD on hemodialysis ([place]) 06/07/2011   TTS [employer]. Started HD in 2006, got transplant in 2014 lasted until Dec 2019 then went back on HD.  Has L thigh AVG as of Jun 2020.  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   Marland Kitchen GERD (gastroesophageal reflux disease)   . Hypertension 111/29/2012   pt states he takes no HTN meds  . Hypothyroidism, secondary   . Morbid obesity ([place])   . Paroxysmal atrial fibrillation ([facility])   . Sleep apnea 06/07/2011   Pt had sllep study done 2 weeks ago- Dr. Joellyn Quails.  Does not  have a CPAP at this time.    Past Surgical History:  Procedure Laterality Date  . ARTERIOVENOUS GRAFT PLACEMENT  08/09/10   Left Thigh Graft by Dr. [name]  . AV FISTULA PLACEMENT Right 05/18/2018   Procedure: INSERTION OF ARTERIOVENOUS (AV) GORE-TEX GRAFT Left THIGH;  Surgeon: Waynetta Sandy, MD;  Location: [facility];  Service: Vascular;  Laterality: Right;  . CAPD REMOVAL  05/08/2011   Procedure: CONTINUOUS AMBULATORY PERITONEAL DIALYSIS  (CAPD) CATHETER REMOVAL;  Surgeon: [name], MD;  [facility];  Service: General;  Laterality: N/A;  Removal of CAPD catheter, Dr. requests to go after 100  . INSERTION OF DIALYSIS CATHETER  10/05/10   Right Femoral Cath insertion by Dr. [name].   Pt ahas had several caths inserted.  . IR FLUORO GUIDE CV LINE RIGHT  05/11/2018  . IR US GUIDE VASC ACCESS RIGHT  05/11/2018  . KIDNEY TRANSPLANT  2014  . UPPER EXTREMITY ANGIOGRAPHY Bilateral 05/13/2018   Procedure: UPPER EXTREMITY ANGIOGRAPHY - bilarteral;  Surgeon: Marty Heck, MD;  Location: [facility];  Service: Cardiovascular;  Laterality: Bilateral;    There were no vitals filed for this visit.  Subjective Assessment - 03/16/19 1304    Subjective  "Doing all right"    Currently in Pain?  No/denies         [facility] PT Assessment - 03/16/19 0001      AROM   Right Shoulder Flexion  180 Degrees    Right Shoulder ABduction  157 Degrees                   [facility] Adult PT Treatment/Exercise - 03/16/19 0001      Ambulation/Gait   Gait Comments  One flight of stairs then back down outside 1.5 laps around building up and down slope       Lumbar Exercises: Aerobic   Elliptical  I10 R5 x2 min each way     Recumbent Bike  L3 x 4 min       Lumbar Exercises: Machines  for Strengthening   Leg Press  100lb 3x10      Knee/Hip Exercises: Machines for Strengthening   Cybex Knee Extension  25lb 2x15    Cybex Knee Flexion  55lb 2x15      Knee/Hip Exercises: Standing   Lateral Step Up  Both;1 set;10 reps;Hand Hold: 0;Step Height: 8"      Shoulder Exercises: Seated   Other Seated Exercises  Seated rows 75lb, lat pulldown 65lb  BIL 2x15    Other Seated Exercises  chest press 25lb 2x15      Shoulder Exercises: Standing   ABduction  Strengthening;Both;20 reps;Weights;AROM                  PT Long Term Goals - 03/16/19 1345      PT LONG TERM GOAL #1   Title  increase shoulder ROM to 140 degrees flexion    Status  Achieved      PT LONG TERM GOAL #2   Title  increase LE strength to 4+/5    Status  Partially Met            Plan - 03/16/19 1345    Clinical Impression Statement  Pt has progressed increasing his R shoulder AROM, but with  pain. He reports that he has pain with his L shoulder but it is tolerable. He did really well with all of today's interventions. Pt demo ed a better tolerance to stair negotiation and outdoor ambulation.    Stability/Clinical Decision Making  Evolving/Moderate complexity    Rehab Potential  Good    PT Frequency  2x / week    PT Duration  8 weeks    PT Treatment/Interventions  ADLs/Self Care Home Management;Gait training;Stair training;Functional mobility training;Neuromuscular re-education;Balance training;Therapeutic exercise;Therapeutic activities;Patient/family education;Manual techniques    PT Next Visit Plan  continue to push endurance and strength as tolerated, Work simulation       Patient will benefit from skilled therapeutic intervention in order to improve the following deficits and impairments:  Postural dysfunction, Cardiopulmonary status limiting activity, Decreased activity tolerance, Decreased endurance, Decreased range of motion, Decreased strength, Impaired UE functional use, Difficulty walking, Decreased balance  Visit Diagnosis: Difficulty in walking, not elsewhere classified  Muscle weakness (generalized)     Problem List Patient Active Problem List   Diagnosis Date Noted  . Labile blood glucose   . Anemia of chronic disease   . Debility 12/13/2018  . Diabetes mellitus type 2 in obese ([place])   . Medically noncompliant   . Pressure injury of skin 12/12/2018  . Sepsis ([place]) 11/30/2018  . Acute respiratory failure with hypoxia ([place]) 11/30/2018  . Acute encephalopathy   . Septic shock ([place])   . Hematuria 11/03/2018  . Genital warts 11/03/2018  . Meralgia paresthetica of right side 08/13/2018  . ESRD on dialysis ([place]) 06/04/2018  . Acute kidney injury superimposed on CKD ([place]) 05/10/2018  . Acidosis 05/10/2018  . Anemia 05/10/2018  . Acute renal failure superimposed on stage 5 chronic kidney disease, not on chronic dialysis ([place])   . Type 2 diabetes mellitus  without complication, without long-term current use of insulin ([place]) 01/25/2018  . Essential hypertension 01/25/2017  . Well adult exam 01/17/2016  . Morbid obesity ([place]) 01/17/2016  . Delayed graft function of kidney transplant due to reason other than ATN or rejection requiring acute dialysis ([place]) 11/02/2012  . Immunosuppression ([place]) 11/02/2012  . Kidney replaced by transplant 11/02/2012  . OSA (obstructive sleep apnea) 05/26/2011    Jori Moll  G Twisha Vanpelt,PTA 03/16/2019, 1:53 PM  [place], [name], [address] Phone: [phone]   Fax:  [phone]  Name: TICO CROTTEAU MRN: [id number] Date of Birth: 1976-05-22

## 2019-03-21 ENCOUNTER — Ambulatory Visit: Payer: 59 | Admitting: Physical Therapy

## 2019-03-21 ENCOUNTER — Other Ambulatory Visit: Payer: Self-pay

## 2019-03-21 ENCOUNTER — Encounter: Payer: Self-pay | Admitting: Physical Therapy

## 2019-03-21 DIAGNOSIS — R262 Difficulty in walking, not elsewhere classified: Secondary | ICD-10-CM

## 2019-03-21 DIAGNOSIS — M6281 Muscle weakness (generalized): Secondary | ICD-10-CM

## 2019-03-21 NOTE — Therapy (Signed)
[place], [name], [address] Phone: [phone]   Fax:  [phone]  Physical Therapy Treatment  Patient Details  Name: [name] MRN: [id number] Date of Birth: 1976-01-12 Referring Provider (PT): Vickey Sages Date: 03/21/2019  PT End of Session - 03/21/19 1345    Visit Number  21    Date for PT Re-Evaluation  04/01/19    PT Start Time  1602    PT Stop Time  1345    PT Time Calculation (min)  1303 min       Past Medical History:  Diagnosis Date  . Anemia    ESRD  . CHF (congestive heart failure) ([place])   . Diabetes mellitus without complication ([place])   . ESRD on hemodialysis ([facility]) 06/07/2011   TTS [employer]. Started HD in 2006, got transplant in 2014 lasted until Dec 2019 then went back on HD.  Has L thigh AVG as of Jun 2020.  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   Marland Kitchen GERD (gastroesophageal reflux disease)   . Hypertension 111/29/2012   pt states he takes no HTN meds  . Hypothyroidism, secondary   . Morbid obesity ([place])   . Paroxysmal atrial fibrillation ([facility])   . Sleep apnea 06/07/2011   Pt had sllep study done 2 weeks ago- Dr. Joellyn Quails.  Does not  have a CPAP at this time.    Past Surgical History:  Procedure Laterality Date  . ARTERIOVENOUS GRAFT PLACEMENT  08/09/10   Left Thigh Graft by Dr. [name]  . AV FISTULA PLACEMENT Right 05/18/2018   Procedure: INSERTION OF ARTERIOVENOUS (AV) GORE-TEX GRAFT Left THIGH;  Surgeon: Waynetta Sandy, MD;  Location: [facility];  Service: Vascular;  Laterality: Right;  . CAPD REMOVAL  05/08/2011   Procedure: CONTINUOUS AMBULATORY PERITONEAL DIALYSIS  (CAPD) CATHETER REMOVAL;  Surgeon: [name], MD;  [facility];  Service: General;  Laterality: N/A;  Removal of CAPD catheter, Dr. requests to go after 100  . INSERTION OF DIALYSIS CATHETER  10/05/10   Right Femoral Cath insertion by Dr. [name].  Pt ahas had several caths inserted.  . IR FLUORO GUIDE CV LINE RIGHT  05/11/2018  . IR US GUIDE VASC ACCESS RIGHT  05/11/2018  . KIDNEY TRANSPLANT  2014  . UPPER EXTREMITY ANGIOGRAPHY Bilateral 05/13/2018   Procedure: UPPER EXTREMITY ANGIOGRAPHY - bilarteral;  Surgeon: Marty Heck, MD;  Location: [facility];  Service: Cardiovascular;  Laterality: Bilateral;    There were no vitals filed for this visit.  Subjective Assessment - 03/21/19 1307    Subjective  Patient reports that he is doing all right. "I only have a couple more days with you"    Currently in Pain?  No/denies                       [facility] Adult PT Treatment/Exercise - 03/21/19 0001      Lumbar Exercises: Aerobic   Elliptical  I10 R5 x2 min each way     Nustep  L6 4 min       Knee/Hip Exercises: Machines for Strengthening   Cybex Knee Extension  25lb 2x15    Cybex Knee Flexion  55lb 2x15      Knee/Hip Exercises: Standing   Lateral Step Up  Both;1 set;10 reps;Hand Hold: 0;Step Height: 8"   7lb dumbbellsa  Shoulder Exercises: Seated   Other Seated Exercises  Seated rows 75lb, lat pulldown 65lb  BIL 2x15    Other Seated Exercises  chest press 35lb 2x10      Shoulder Exercises: Standing   Flexion  Both;AROM;15 reps    ABduction  Strengthening;Both;AROM;15 reps    Other Standing Exercises  Functional box rotations, siimulating work activity 2x5 wit 2.5lb in box    Other Standing Exercises  Bilat Flex with cane 2lb 2x10; Triceps ext 45lb 2x15; biceps 35lb 2x15       Shoulder Exercises: ROM/Strengthening   Wall Wash  3x5                   PT Long Term Goals - 03/16/19 1345      PT LONG TERM GOAL #1   Title  increase shoulder ROM to 140 degrees flexion    Status  Achieved      PT LONG TERM GOAL #2   Title  increase LE strength to 4+/5    Status  Partially Met            Plan - 03/21/19 1346    Clinical Impression Statement  No issues with today's  interventions. He does have some weakness with wall push ups. Difficulty with work simulation box lifts with 2.5lb weight added due to R shoulder limitations. Some L knee pain reported with lateral step ups. -    Stability/Clinical Decision Making  Evolving/Moderate complexity    Rehab Potential  Good    PT Frequency  2x / week    PT Duration  8 weeks    PT Treatment/Interventions  ADLs/Self Care Home Management;Gait training;Stair training;Functional mobility training;Neuromuscular re-education;Balance training;Therapeutic exercise;Therapeutic activities;Patient/family education;Manual techniques    PT Next Visit Plan  continue to push endurance and strength as tolerated, Work simulation       Patient will benefit from skilled therapeutic intervention in order to improve the following deficits and impairments:  Postural dysfunction, Cardiopulmonary status limiting activity, Decreased activity tolerance, Decreased endurance, Decreased range of motion, Decreased strength, Impaired UE functional use, Difficulty walking, Decreased balance  Visit Diagnosis: Muscle weakness (generalized)  Difficulty in walking, not elsewhere classified     Problem List Patient Active Problem List   Diagnosis Date Noted  . Labile blood glucose   . Anemia of chronic disease   . Debility 12/13/2018  . Diabetes mellitus type 2 in obese ([place])   . Medically noncompliant   . Pressure injury of skin 12/12/2018  . Sepsis ([place]) 11/30/2018  . Acute respiratory failure with hypoxia ([place]) 11/30/2018  . Acute encephalopathy   . Septic shock ([place])   . Hematuria 11/03/2018  . Genital warts 11/03/2018  . Meralgia paresthetica of right side 08/13/2018  . ESRD on dialysis ([place]) 06/04/2018  . Acute kidney injury superimposed on CKD ([place]) 05/10/2018  . Acidosis 05/10/2018  . Anemia 05/10/2018  . Acute renal failure superimposed on stage 5 chronic kidney disease, not on chronic dialysis ([place])   . Type 2 diabetes  mellitus without complication, without long-term current use of insulin ([place]) 01/25/2018  . Essential hypertension 01/25/2017  . Well adult exam 01/17/2016  . Morbid obesity ([place]) 01/17/2016  . Delayed graft function of kidney transplant due to reason other than ATN or rejection requiring acute dialysis ([place]) 11/02/2012  . Immunosuppression ([place]) 11/02/2012  . Kidney replaced by transplant 11/02/2012  . OSA (obstructive sleep apnea) 05/26/2011    Scot Jun, PTA 03/21/2019, 1:48 PM  [facility]  [place], [name], [address] Phone: [phone]   Fax:  [phone]  Name: [name] MRN: [id number] Date of Birth: 1975/12/05

## 2019-03-28 ENCOUNTER — Ambulatory Visit: Payer: 59 | Admitting: Physical Therapy

## 2019-03-30 ENCOUNTER — Encounter: Payer: Self-pay | Admitting: Physical Therapy

## 2019-03-30 ENCOUNTER — Other Ambulatory Visit: Payer: Self-pay

## 2019-03-30 ENCOUNTER — Ambulatory Visit: Payer: 59 | Admitting: Physical Therapy

## 2019-03-30 ENCOUNTER — Telehealth: Payer: Self-pay | Admitting: Physical Medicine & Rehabilitation

## 2019-03-30 DIAGNOSIS — M6281 Muscle weakness (generalized): Secondary | ICD-10-CM

## 2019-03-30 DIAGNOSIS — R262 Difficulty in walking, not elsewhere classified: Secondary | ICD-10-CM

## 2019-03-30 NOTE — Telephone Encounter (Signed)
Patient calling to make an appt with Dr. Naaman Plummer, he was discharged back in July.  Patient is needing a note to return back to work and stated his primary doctor wouldn't do it.  Do you want [place] to make patient a hospital follow up with you?

## 2019-03-30 NOTE — Therapy (Signed)
[place], [name], [address] Phone: [phone]   Fax:  [phone]  Physical Therapy Treatment  Patient Details  Name: [name] MRN: [id number] Date of Birth: 22-Aug-1975 Referring Provider (PT): Vickey Sages Date: 03/30/2019  PT End of Session - 03/30/19 1346    Visit Number  22    Number of Visits  26    Date for PT Re-Evaluation  04/01/19    PT Start Time  5456    PT Stop Time  1345    PT Time Calculation (min)  39 min    Activity Tolerance  Patient tolerated treatment well    Behavior During Therapy  [facility] for tasks assessed/performed       Past Medical History:  Diagnosis Date  . Anemia    ESRD  . CHF (congestive heart failure) ([place])   . Diabetes mellitus without complication ([place])   . ESRD on hemodialysis ([place]) 06/07/2011   TTS [employer]. Started HD in 2006, got transplant in 2014 lasted until Dec 2019 then went back on HD.  Has L thigh AVG as of Jun 2020.  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   Marland Kitchen GERD (gastroesophageal reflux disease)   . Hypertension 111/29/2012   pt states he takes no HTN meds  . Hypothyroidism, secondary   . Morbid obesity ([place])   . Paroxysmal atrial fibrillation ([facility])   . Sleep apnea 06/07/2011   Pt had sllep study done 2 weeks ago- Dr. Joellyn Quails.  Does not  have a CPAP at this time.    Past Surgical History:  Procedure Laterality Date  . ARTERIOVENOUS GRAFT PLACEMENT  08/09/10   Left Thigh Graft by Dr. [name]  . AV FISTULA PLACEMENT Right 05/18/2018   Procedure: INSERTION OF ARTERIOVENOUS (AV) GORE-TEX GRAFT Left THIGH;  Surgeon: Waynetta Sandy, MD;  Location: [facility];  Service: Vascular;  Laterality: Right;  . CAPD REMOVAL  05/08/2011   Procedure: CONTINUOUS AMBULATORY PERITONEAL DIALYSIS  (CAPD) CATHETER REMOVAL;  Surgeon: [name], MD;  [facility];  Service: General;  Laterality: N/A;   Removal of CAPD catheter, Dr. requests to go after 100  . INSERTION OF DIALYSIS CATHETER  10/05/10   Right Femoral Cath insertion by Dr. [name].  Pt ahas had several caths inserted.  . IR FLUORO GUIDE CV LINE RIGHT  05/11/2018  . IR US GUIDE VASC ACCESS RIGHT  05/11/2018  . KIDNEY TRANSPLANT  2014  . UPPER EXTREMITY ANGIOGRAPHY Bilateral 05/13/2018   Procedure: UPPER EXTREMITY ANGIOGRAPHY - bilarteral;  Surgeon: Marty Heck, MD;  Location: [facility];  Service: Cardiovascular;  Laterality: Bilateral;    There were no vitals filed for this visit.  Subjective Assessment - 03/30/19 1308    Subjective  "Im all right, just weak"    Currently in Pain?  No/denies                       [facility] Adult PT Treatment/Exercise - 03/30/19 0001      Ambulation/Gait   Stairs  Yes    Stair Management Technique  No rails    Number of Stairs  72    Height of Stairs  6    Gait Comments  3 flights of stairs then back down 1.5 laps around building      Lumbar Exercises: Aerobic   Elliptical  I13 R7 x2  min each way     UBE (Upper Arm Bike)  L6 2 min each way      Lumbar Exercises: Machines for Strengthening   Leg Press  100lb 3x10    Other Lumbar Machine Exercise  Rows 75lb 2x15, Lat 55lb 2x15      Lumbar Exercises: Seated   Other Seated Lumbar Exercises  Sit to stand with forward press blue ball 2x10       Shoulder Exercises: ROM/Strengthening   Other ROM/Strengthening Exercises  Chest press 35lb 2x15                  PT Long Term Goals - 03/30/19 1347      PT LONG TERM GOAL #1   Title  increase shoulder ROM to 140 degrees flexion    Status  Achieved      PT LONG TERM GOAL #2   Title  increase LE strength to 4+/5    Status  Partially Met      PT LONG TERM GOAL #3   Title  walk .75 miles    Status  Achieved      PT LONG TERM GOAL #4   Title  decrease TUG time to 11 seconds    Status  Achieved            Plan - 03/30/19 1348     Clinical Impression Statement  pt ~ 6 minutes late for today's treatment session. He voices concern about his ability to return to work, reporting that he could not do his job at this point. He was able to complete all of today's exercises. Visual signs of fatigue noted with stair negotiation and outdoor ambulation. Some L shoulder elevated noted with forward presses with weighted ball.    Stability/Clinical Decision Making  Evolving/Moderate complexity    Rehab Potential  Good    PT Frequency  2x / week    PT Duration  8 weeks    PT Treatment/Interventions  ADLs/Self Care Home Management;Gait training;Stair training;Functional mobility training;Neuromuscular re-education;Balance training;Therapeutic exercise;Therapeutic activities;Patient/family education;Manual techniques    PT Next Visit Plan  continue to push endurance and strength as tolerated, Work simulation       Patient will benefit from skilled therapeutic intervention in order to improve the following deficits and impairments:  Postural dysfunction, Cardiopulmonary status limiting activity, Decreased activity tolerance, Decreased endurance, Decreased range of motion, Decreased strength, Impaired UE functional use, Difficulty walking, Decreased balance  Visit Diagnosis: Muscle weakness (generalized)  Difficulty in walking, not elsewhere classified     Problem List Patient Active Problem List   Diagnosis Date Noted  . Labile blood glucose   . Anemia of chronic disease   . Debility 12/13/2018  . Diabetes mellitus type 2 in obese ([place])   . Medically noncompliant   . Pressure injury of skin 12/12/2018  . Sepsis ([place]) 11/30/2018  . Acute respiratory failure with hypoxia ([place]) 11/30/2018  . Acute encephalopathy   . Septic shock ([place])   . Hematuria 11/03/2018  . Genital warts 11/03/2018  . Meralgia paresthetica of right side 08/13/2018  . ESRD on dialysis ([place]) 06/04/2018  . Acute kidney injury superimposed on CKD ([place])  05/10/2018  . Acidosis 05/10/2018  . Anemia 05/10/2018  . Acute renal failure superimposed on stage 5 chronic kidney disease, not on chronic dialysis ([place])   . Type 2 diabetes mellitus without complication, without long-term current use of insulin ([place]) 01/25/2018  . Essential hypertension 01/25/2017  . Well adult exam 01/17/2016  .  Morbid obesity ([place]) 01/17/2016  . Delayed graft function of kidney transplant due to reason other than ATN or rejection requiring acute dialysis ([place]) 11/02/2012  . Immunosuppression ([place]) 11/02/2012  . Kidney replaced by transplant 11/02/2012  . OSA (obstructive sleep apnea) 05/26/2011    Scot Jun, PTA 03/30/2019, 1:53 PM  [place], [name], [address] Phone: [phone]   Fax:  [phone]  Name: [name] MRN: [id number] Date of Birth: Jun 30, 1975

## 2019-03-31 NOTE — Telephone Encounter (Signed)
That's fine, he was Dr. Naaman Plummer patient, I was likely covering. Thanks.

## 2019-03-31 NOTE — Telephone Encounter (Signed)
I never saw this patient. [name] is the only one I saw on record

## 2019-03-31 NOTE — Telephone Encounter (Signed)
Dr. Posey Pronto would you want to see this patient?  Please see message that I sent to Dr. Naaman Plummer.  Patient told me he saw Dr. Naaman Plummer.  Thanks.

## 2019-04-04 ENCOUNTER — Other Ambulatory Visit: Payer: Self-pay

## 2019-04-04 ENCOUNTER — Encounter: Payer: Self-pay | Admitting: Physical Therapy

## 2019-04-04 ENCOUNTER — Ambulatory Visit: Payer: 59 | Admitting: Physical Therapy

## 2019-04-04 DIAGNOSIS — R262 Difficulty in walking, not elsewhere classified: Secondary | ICD-10-CM | POA: Diagnosis not present

## 2019-04-04 DIAGNOSIS — M6281 Muscle weakness (generalized): Secondary | ICD-10-CM

## 2019-04-04 NOTE — Therapy (Signed)
[place], [name], [address] Phone: [phone]   Fax:  [phone]  Physical Therapy Treatment  Patient Details  Name: [name] MRN: [id number] Date of Birth: 12/29/1975 Referring Provider (PT): Vickey Sages Date: 04/04/2019  PT End of Session - 04/04/19 1325    Visit Number  23    Date for PT Re-Evaluation  04/01/19    PT Start Time  1300    PT Stop Time  1325    PT Time Calculation (min)  25 min    Activity Tolerance  Patient tolerated treatment well    Behavior During Therapy  [facility] for tasks assessed/performed       Past Medical History:  Diagnosis Date  . Anemia    ESRD  . CHF (congestive heart failure) ([place])   . Diabetes mellitus without complication ([place])   . ESRD on hemodialysis ([place]) 06/07/2011   TTS [employer]. Started HD in 2006, got transplant in 2014 lasted until Dec 2019 then went back on HD.  Has L thigh AVG as of Jun 2020.  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   Marland Kitchen GERD (gastroesophageal reflux disease)   . Hypertension 111/29/2012   pt states he takes no HTN meds  . Hypothyroidism, secondary   . Morbid obesity ([place])   . Paroxysmal atrial fibrillation ([facility])   . Sleep apnea 06/07/2011   Pt had sllep study done 2 weeks ago- Dr. Joellyn Quails.  Does not  have a CPAP at this time.    Past Surgical History:  Procedure Laterality Date  . ARTERIOVENOUS GRAFT PLACEMENT  08/09/10   Left Thigh Graft by Dr. [name]  . AV FISTULA PLACEMENT Right 05/18/2018   Procedure: INSERTION OF ARTERIOVENOUS (AV) GORE-TEX GRAFT Left THIGH;  Surgeon: Waynetta Sandy, MD;  Location: [place];  Service: Vascular;  Laterality: Right;  . CAPD REMOVAL  05/08/2011   Procedure: CONTINUOUS AMBULATORY PERITONEAL DIALYSIS  (CAPD) CATHETER REMOVAL;  Surgeon: [name], MD;  [facility];  Service: General;  Laterality: N/A;  Removal of CAPD catheter, Dr.  requests to go after 100  . INSERTION OF DIALYSIS CATHETER  10/05/10   Right Femoral Cath insertion by Dr. [name].  Pt ahas had several caths inserted.  . IR FLUORO GUIDE CV LINE RIGHT  05/11/2018  . IR US GUIDE VASC ACCESS RIGHT  05/11/2018  . KIDNEY TRANSPLANT  2014  . UPPER EXTREMITY ANGIOGRAPHY Bilateral 05/13/2018   Procedure: UPPER EXTREMITY ANGIOGRAPHY - bilarteral;  Surgeon: Marty Heck, MD;  Location: [place] [facility];  Service: Cardiovascular;  Laterality: Bilateral;    There were no vitals filed for this visit.  Subjective Assessment - 04/04/19 1302    Subjective  "I cant complain"    Currently in Pain?  No/denies         [facility] PT Assessment - 04/04/19 0001      AROM   Right Shoulder Flexion  180 Degrees   uses momentum    Right Shoulder ABduction  65 Degrees   painful oday                   [facility] Adult PT Treatment/Exercise - 04/04/19 0001      Lumbar Exercises: Aerobic   Elliptical  I15 R7 x2 min each way     UBE (Upper Arm Bike)  L6 2 min each way  Lumbar Exercises: Machines for Strengthening   Other Lumbar Machine Exercise  Rows 75lb 2x15, Lat 55lb 2x15      Knee/Hip Exercises: Standing   Other Standing Knee Exercises  Attempted 3 level cabinet reches with 1lb weight but was unable to complete      Shoulder Exercises: Standing   Other Standing Exercises  Functional box rotations, simulating work activity 2x5 wit 2.5lb in box      Shoulder Exercises: ROM/Strengthening   Other ROM/Strengthening Exercises  Chest press 35lb 2x15                  PT Long Term Goals - 04/04/19 1325      PT LONG TERM GOAL #1   Title  increase shoulder ROM to 140 degrees flexion    Status  Achieved      PT LONG TERM GOAL #2   Title  increase LE strength to 4+/5    Status  Achieved      PT LONG TERM GOAL #3   Title  walk .75 miles    Status  Achieved      PT LONG TERM GOAL #4   Title  decrease TUG time to 11 seconds    Status   Achieved            Plan - 04/04/19 1326    Clinical Impression Statement  Pt has continued difficulty performing any shoulder activity at shoulder height and above. Pt was unable to reach shoulder level shelves and above holding 1lb resistance. He was able to complete work simulation box flips but with pain and a lot of trunk lean to avoid RUE use. Unable abduct RUE to the same degrees as previous measurement due to pain and weakness. Pt R shoulder is his only limiting factor at this point. He has previous reported a R shoulder injury that's went untreated, recent hospital stay made those R shoulder issues worst to the point he does not feel like he could do his job.    Stability/Clinical Decision Making  Evolving/Moderate complexity    Rehab Potential  Good    PT Frequency  2x / week    PT Duration  8 weeks    PT Treatment/Interventions  ADLs/Self Care Home Management;Gait training;Stair training;Functional mobility training;Neuromuscular re-education;Balance training;Therapeutic exercise;Therapeutic activities;Patient/family education;Manual techniques    PT Next Visit Plan  D/C Pt       Patient will benefit from skilled therapeutic intervention in order to improve the following deficits and impairments:  Postural dysfunction, Cardiopulmonary status limiting activity, Decreased activity tolerance, Decreased endurance, Decreased range of motion, Decreased strength, Impaired UE functional use, Difficulty walking, Decreased balance  Visit Diagnosis: Muscle weakness (generalized)  Difficulty in walking, not elsewhere classified     Problem List Patient Active Problem List   Diagnosis Date Noted  . Labile blood glucose   . Anemia of chronic disease   . Debility 12/13/2018  . Diabetes mellitus type 2 in obese ([place])   . Medically noncompliant   . Pressure injury of skin 12/12/2018  . Sepsis ([place]) 11/30/2018  . Acute respiratory failure with hypoxia ([place]) 11/30/2018  . Acute  encephalopathy   . Septic shock ([place])   . Hematuria 11/03/2018  . Genital warts 11/03/2018  . Meralgia paresthetica of right side 08/13/2018  . ESRD on dialysis ([place]) 06/04/2018  . Acute kidney injury superimposed on CKD ([place]) 05/10/2018  . Acidosis 05/10/2018  . Anemia 05/10/2018  . Acute renal failure superimposed on stage 5 chronic  kidney disease, not on chronic dialysis ([place])   . Type 2 diabetes mellitus without complication, without long-term current use of insulin ([place]) 01/25/2018  . Essential hypertension 01/25/2017  . Well adult exam 01/17/2016  . Morbid obesity ([place]) 01/17/2016  . Delayed graft function of kidney transplant due to reason other than ATN or rejection requiring acute dialysis ([place]) 11/02/2012  . Immunosuppression ([place]) 11/02/2012  . Kidney replaced by transplant 11/02/2012  . OSA (obstructive sleep apnea) 05/26/2011   PHYSICAL THERAPY DISCHARGE SUMMARY  Visits from Start of Care: 23   Remaining deficits: Decrease R shoulder ROM and strength.     Plan: Patient agrees to discharge.  Patient goals were partially met. Patient is being discharged due to lack of progress.  ?????     Scot Jun , PTA 04/04/2019, 1:33 PM  [place], [name], [address] Phone: [phone]   Fax:  [phone]  Name: [name] MRN: [id number] Date of Birth: 1975/08/11

## 2019-04-06 ENCOUNTER — Ambulatory Visit: Payer: 59 | Admitting: Physical Therapy

## 2019-04-11 ENCOUNTER — Encounter: Payer: 59 | Attending: Physical Medicine & Rehabilitation | Admitting: Physical Medicine & Rehabilitation

## 2019-04-11 ENCOUNTER — Encounter: Payer: Self-pay | Admitting: Physical Medicine & Rehabilitation

## 2019-04-11 ENCOUNTER — Other Ambulatory Visit: Payer: Self-pay

## 2019-04-11 VITALS — BP 127/87 | HR 95 | Temp 97.7°F | Ht 74.0 in | Wt 308.0 lb

## 2019-04-11 DIAGNOSIS — M7581 Other shoulder lesions, right shoulder: Secondary | ICD-10-CM | POA: Diagnosis present

## 2019-04-11 DIAGNOSIS — Z9119 Patient's noncompliance with other medical treatment and regimen: Secondary | ICD-10-CM | POA: Diagnosis present

## 2019-04-11 DIAGNOSIS — G4733 Obstructive sleep apnea (adult) (pediatric): Secondary | ICD-10-CM | POA: Insufficient documentation

## 2019-04-11 DIAGNOSIS — Z91199 Patient's noncompliance with other medical treatment and regimen due to unspecified reason: Secondary | ICD-10-CM

## 2019-04-11 DIAGNOSIS — R5381 Other malaise: Secondary | ICD-10-CM | POA: Diagnosis present

## 2019-04-11 DIAGNOSIS — N186 End stage renal disease: Secondary | ICD-10-CM | POA: Diagnosis present

## 2019-04-11 DIAGNOSIS — Z992 Dependence on renal dialysis: Secondary | ICD-10-CM

## 2019-04-11 MED ORDER — DICLOFENAC SODIUM 1 % TD GEL: 2.0000 g | Freq: Four times a day (QID) | TRANSDERMAL | 1 refills | Status: DC

## 2019-04-11 NOTE — Progress Notes (Signed)
Subjective:    Patient ID: [name], male    DOB: 1976-03-28, 43 y.o.   MRN: [id number]  HPI Right-handed male history of end-stage renal disease with hemodialysis status post kidney transplant in May 2014 followed at [facility], poorly controlled hypertension, diabetes mellitus, diastolic congestive heart failure, morbid obesity with BMI of 40, obstructive sleep apnea presents for hospital follow up.   He was supposed to follow up after discharge in July, but did not.  He presents today. He states his sacral ulcer has healed.  BP is controlled. CBGs have been controlled. He is not using his CPAP, no reason. He completed therapies last week. Denies falls.   Pain Inventory Average Pain 0 Pain Right Now 0 My pain is no pain  In the last 24 hours, has pain interfered with the following? General activity 0 Relation with others 0 Enjoyment of life 0 What TIME of day is your pain at its worst? no pain Sleep (in general) Fair  Pain is worse with: no pain Pain improves with: no pain Relief from Meds: no pain  Mobility walk without assistance how many minutes can you walk? 60 ability to climb steps?  yes do you drive?  yes Do you have any goals in this area?  no  Function disabled: date disabled .  Neuro/Psych No problems in this area  Prior Studies hospital f/u  Physicians involved in your care hospital f/u   Family History  Problem Relation Age of Onset  . Diabetes Father   . Stroke Father   . Anesthesia problems Neg Hx    Social History   Socioeconomic History  . Marital status: Single    Spouse name: Not on file  . Number of children: 0  . Years of education: Not on file  . Highest education level: Not on file  Occupational History    Employer: [employer]  . Financial resource strain: Not on file  . Food insecurity    Worry: Not on file    Inability: Not on file  . Transportation needs    Medical: Not on file    Non-medical:  Not on file  Tobacco Use  . Smoking status: Never Smoker  . Smokeless tobacco: Never Used  Substance and Sexual Activity  . Alcohol use: No  . Drug use: Yes    Frequency: 5.0 times per week    Types: Marijuana  . Sexual activity: Not on file  Lifestyle  . Physical activity    Days per week: Not on file    Minutes per session: Not on file  . Stress: Not on file  Relationships  . Social Herbalist on phone: Not on file    Gets together: Not on file    Attends religious service: Not on file    Active member of club or organization: Not on file    Attends meetings of clubs or organizations: Not on file    Relationship status: Not on file  Other Topics Concern  . Not on file  Social History Narrative   Lives alone.    Past Surgical History:  Procedure Laterality Date  . ARTERIOVENOUS GRAFT PLACEMENT  08/09/10   Left Thigh Graft by Dr. [name]  . AV FISTULA PLACEMENT Right 05/18/2018   Procedure: INSERTION OF ARTERIOVENOUS (AV) GORE-TEX GRAFT Left THIGH;  Surgeon: Waynetta Sandy, MD;  Location: [facility];  Service: Vascular;  Laterality: Right;  . CAPD REMOVAL  05/08/2011   Procedure: CONTINUOUS AMBULATORY PERITONEAL DIALYSIS  (CAPD) CATHETER REMOVAL;  Surgeon: [name], MD;  [facility];  Service: General;  Laterality: N/A;  Removal of CAPD catheter, Dr. requests to go after 100  . INSERTION OF DIALYSIS CATHETER  10/05/10   Right Femoral Cath insertion by Dr. [name].  Pt ahas had several caths inserted.  . IR FLUORO GUIDE CV LINE RIGHT  05/11/2018  . IR US GUIDE VASC ACCESS RIGHT  05/11/2018  . KIDNEY TRANSPLANT  2014  . UPPER EXTREMITY ANGIOGRAPHY Bilateral 05/13/2018   Procedure: UPPER EXTREMITY ANGIOGRAPHY - bilarteral;  Surgeon: Marty Heck, MD;  Location: [place] [facility];  Service: Cardiovascular;  Laterality: Bilateral;   Past Medical History:  Diagnosis Date  . Anemia    ESRD  . CHF (congestive heart failure) ([place])    . Diabetes mellitus without complication ([place])   . ESRD on hemodialysis ([place]) 06/07/2011   TTS [employer]. Started HD in 2006, got transplant in 2014 lasted until Dec 2019 then went back on HD.  Has L thigh AVG as of Jun 2020.  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   Marland Kitchen GERD (gastroesophageal reflux disease)   . Hypertension 111/29/2012   pt states he takes no HTN meds  . Hypothyroidism, secondary   . Morbid obesity ([place])   . Paroxysmal atrial fibrillation (H[facility])   . Sleep apnea 06/07/2011   Pt had sllep study done 2 weeks ago- Dr. Joellyn Quails.  Does not  have a CPAP at this time.   BP 127/87   Pulse 95   Temp 97.7 F (36.5 C)   Ht 6\' 2"  (1.88 m)   Wt (!) 308 lb (139.7 kg)   SpO2 98%   BMI 39.54 kg/m   Opioid Risk Score:   Fall Risk Score:  `1  Depression screen PHQ 2/9  Depression screen PHQ 2/9 01/25/2018  Decreased Interest 0  Down, Depressed, Hopeless 0  PHQ - 2 Score 0   Review of Systems  Constitutional: Negative.   HENT: Negative.   Eyes: Negative.   Respiratory: Positive for apnea.   Cardiovascular: Negative.   Gastrointestinal: Positive for vomiting.  Endocrine: Negative.   Genitourinary: Negative.   Musculoskeletal: Positive for arthralgias.  Skin: Negative.   Allergic/Immunologic: Negative.   Neurological: Negative.   Psychiatric/Behavioral: Negative.       Objective:   Physical Exam  Constitutional: No distress . Vital signs reviewed. HENT: Normocephalic.  Atraumatic. Eyes: EOMI. No discharge. Cardiovascular: No JVD. Respiratory: Normal effort.  No stridor. GI: Non-distended. Skin: Warm and dry.  Intact. Psych: Normal mood.  Normal behavior. Musc: No edema in extremities.  No tenderness in extremities. +Neer's on right +Empty can on right Neuro: Alert Motor: Grossly 4+/5 throughout, some right shoulder pain inhibition    Assessment & Plan:  Right-handed male history of end-stage renal disease with hemodialysis status post kidney  transplant in May 2014 followed at [facility], poorly controlled hypertension, diabetes mellitus, diastolic congestive heart failure, morbid obesity with BMI of 40, obstructive sleep apnea presents for hospital follow up.   1.  Debility secondary to ARDS/pulmonary edema with acute metabolic encephalopathy  Cognitive issues have resolved.  Patient with disability forms to be filled out  2. Right shoulder pain - likely rotator cuff tendenosis  Encouraged ice/heat  Voltaren gel ordered  3. HTN  Cont meds  Controlled at present  4.  ESRD.    Cont recs per  Nephro, pt notes compliance  5.  Diabetes mellitus poor medical compliance.    States CBGs have been controlled  6. Morbid obesity:    No weight loss  7. OSA.    Noncompliant with CPAP, encouraged compliance

## 2019-05-23 ENCOUNTER — Encounter: Payer: 59 | Attending: Physical Medicine & Rehabilitation | Admitting: Physical Medicine & Rehabilitation

## 2019-05-23 DIAGNOSIS — M7581 Other shoulder lesions, right shoulder: Secondary | ICD-10-CM | POA: Insufficient documentation

## 2019-05-23 DIAGNOSIS — G4733 Obstructive sleep apnea (adult) (pediatric): Secondary | ICD-10-CM | POA: Insufficient documentation

## 2019-05-23 DIAGNOSIS — Z992 Dependence on renal dialysis: Secondary | ICD-10-CM | POA: Insufficient documentation

## 2019-05-23 DIAGNOSIS — N186 End stage renal disease: Secondary | ICD-10-CM | POA: Insufficient documentation

## 2019-05-23 DIAGNOSIS — R5381 Other malaise: Secondary | ICD-10-CM | POA: Insufficient documentation

## 2019-05-23 DIAGNOSIS — Z9119 Patient's noncompliance with other medical treatment and regimen: Secondary | ICD-10-CM | POA: Insufficient documentation

## 2019-08-15 ENCOUNTER — Other Ambulatory Visit: Payer: Self-pay | Admitting: Nephrology

## 2019-08-17 ENCOUNTER — Other Ambulatory Visit: Payer: Self-pay | Admitting: Nephrology

## 2019-08-17 DIAGNOSIS — Z01818 Encounter for other preprocedural examination: Secondary | ICD-10-CM

## 2019-08-24 ENCOUNTER — Other Ambulatory Visit: Payer: 59

## 2020-03-08 ENCOUNTER — Other Ambulatory Visit (HOSPITAL_COMMUNITY): Payer: Self-pay | Admitting: Nephrology

## 2020-03-08 DIAGNOSIS — Z7682 Awaiting organ transplant status: Secondary | ICD-10-CM

## 2020-03-21 ENCOUNTER — Other Ambulatory Visit (HOSPITAL_COMMUNITY): Payer: Self-pay

## 2020-03-21 ENCOUNTER — Ambulatory Visit (HOSPITAL_COMMUNITY)
Admission: RE | Admit: 2020-03-21 | Discharge: 2020-03-21 | Disposition: A | Discharge status: Home / Self Care | Payer: BC Managed Care – PPO | Source: Ambulatory Visit | Attending: Nephrology | Admitting: Nephrology

## 2020-03-21 ENCOUNTER — Other Ambulatory Visit: Payer: Self-pay

## 2020-03-21 DIAGNOSIS — I509 Heart failure, unspecified: Secondary | ICD-10-CM | POA: Insufficient documentation

## 2020-03-21 DIAGNOSIS — N186 End stage renal disease: Secondary | ICD-10-CM | POA: Diagnosis not present

## 2020-03-21 DIAGNOSIS — Z7682 Awaiting organ transplant status: Secondary | ICD-10-CM | POA: Diagnosis not present

## 2020-03-21 DIAGNOSIS — Z992 Dependence on renal dialysis: Secondary | ICD-10-CM | POA: Insufficient documentation

## 2020-03-21 DIAGNOSIS — I132 Hypertensive heart and chronic kidney disease with heart failure and with stage 5 chronic kidney disease, or end stage renal disease: Secondary | ICD-10-CM | POA: Diagnosis not present

## 2020-03-21 DIAGNOSIS — Z01818 Encounter for other preprocedural examination: Secondary | ICD-10-CM | POA: Insufficient documentation

## 2020-03-21 MED ORDER — PERFLUTREN LIPID MICROSPHERE
1.0000 mL | INTRAVENOUS | Status: AC | PRN
  Administered 2020-03-21: 4 mL via INTRAVENOUS
  Filled 2020-03-21: qty 10

## 2020-03-21 NOTE — Progress Notes (Signed)
  Echocardiogram Echocardiogram Stress Test with definity has been performed.  Darlina Sicilian M 03/21/2020, 9:37 AM

## 2020-03-21 NOTE — Progress Notes (Signed)
      [name] presented for a stress echocardiogram test today.  No immediate complications.  Stress imaging is pending at this time.  Preliminary EKG findings may be listed in the chart, but the stress test result will not be finalized until perfusion imaging is complete.  Dr Gardiner Rhyme to read.  Rosaria Ferries, PA-C 03/21/2020, 8:58 AM

## 2020-05-20 IMAGING — DX PORTABLE CHEST - 1 VIEW
1 series · 1 of 1 positions shown · non-contrast
Comparison: Yesterday

CLINICAL DATA: Oxygen desaturation

EXAM:
PORTABLE CHEST 1 VIEW

[chest ap]
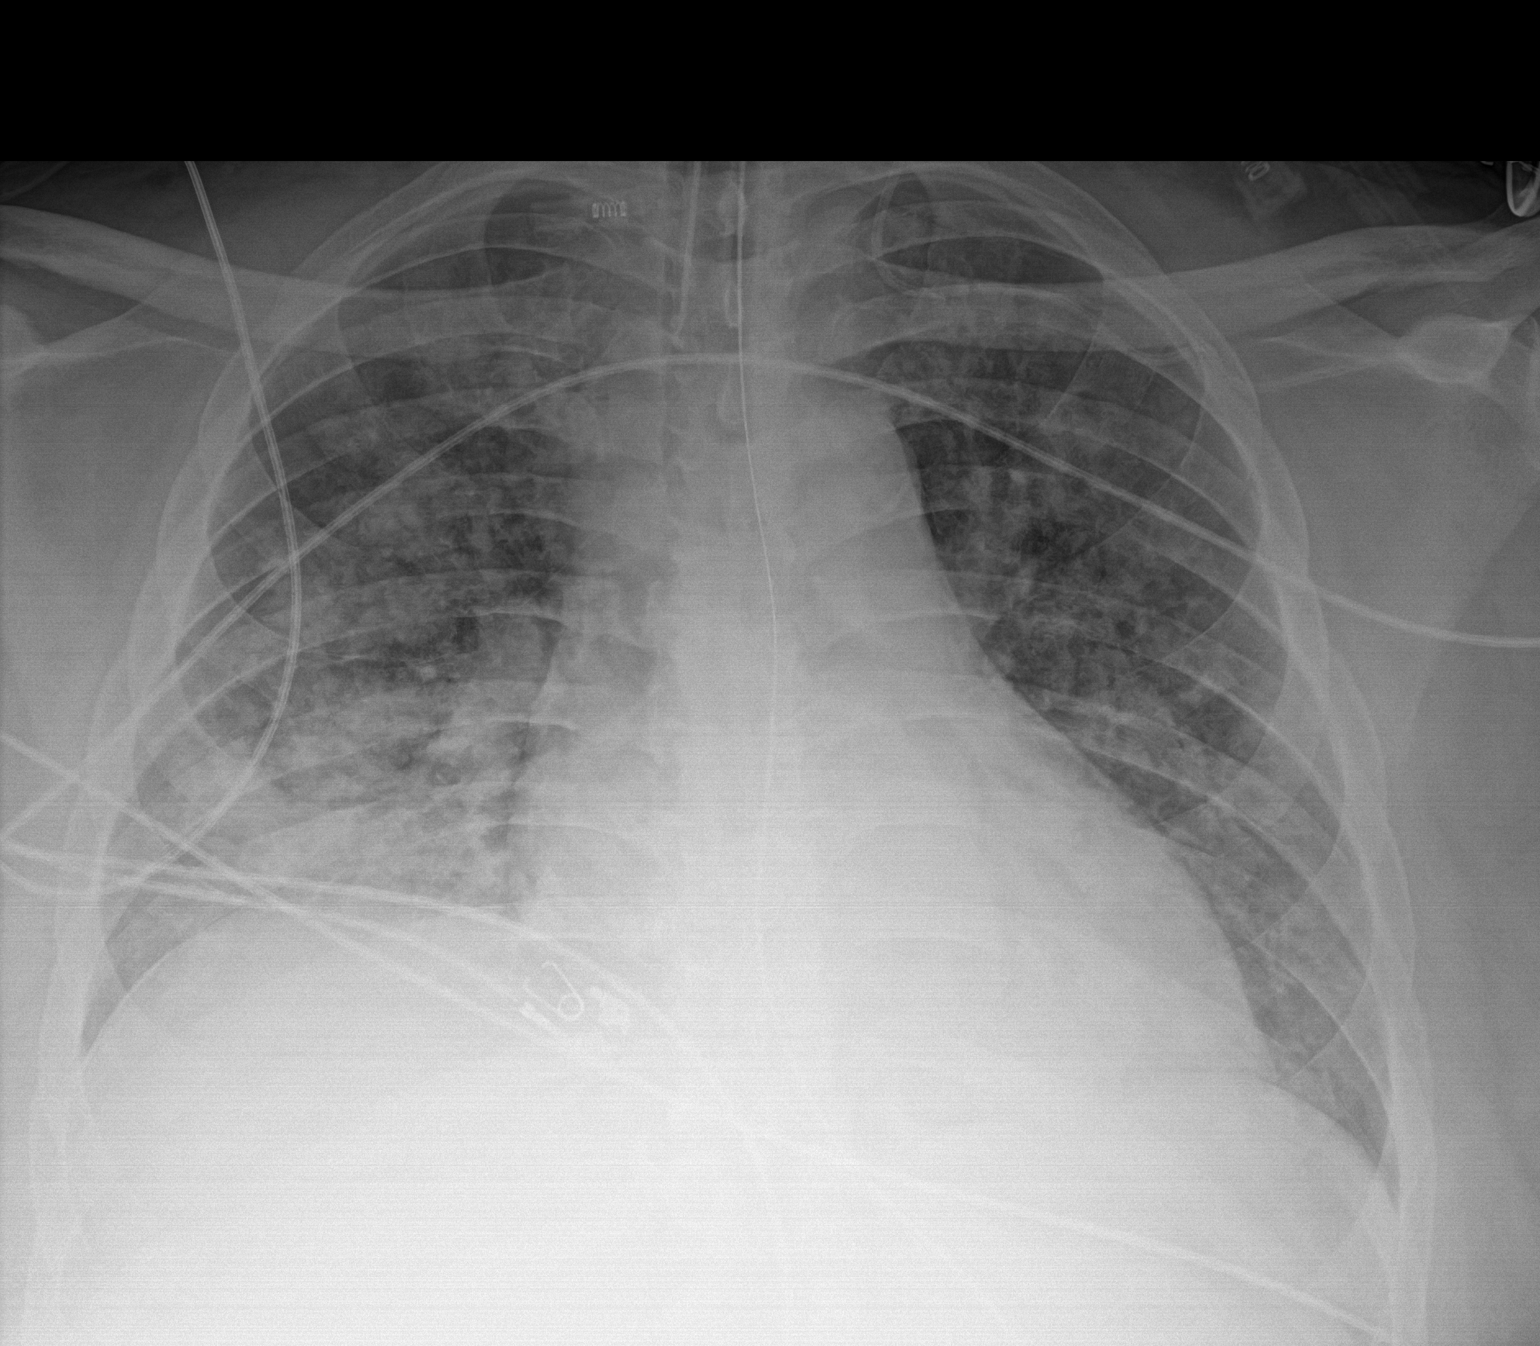

[1 of 1 positions shown; findings below may reference images not displayed]

FINDINGS: Diffuse airspace opacity which appears somewhat less dense than
before, likely related to better lung volumes. Endotracheal tube tip
is at the clavicular heads. The orogastric tube reaches the stomach.
Left subclavian catheter rises in the IJ, tip not seen. This finding
is known to the service based on prior documented communication.
IMPRESSION: Stable hardware positioning including left subclavian catheter
terminating in the left IJ.

Mildly improved lung volumes but continued extensive airspace
disease.

## 2020-05-21 IMAGING — US ULTRASOUND ABDOMEN LIMITED
1 series · 14 of 25 positions shown · non-contrast
Comparison: None.

CLINICAL DATA: Elevated LFTs.

EXAM:
ULTRASOUND ABDOMEN LIMITED RIGHT UPPER QUADRANT

[Series 1: ultrasound abdomen limited · 14 of 35 slices shown]
[im 1/35]
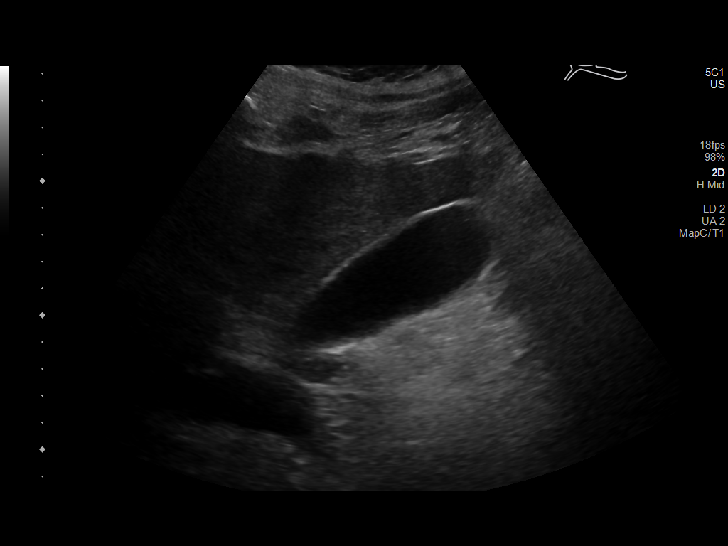
[im 3/35]
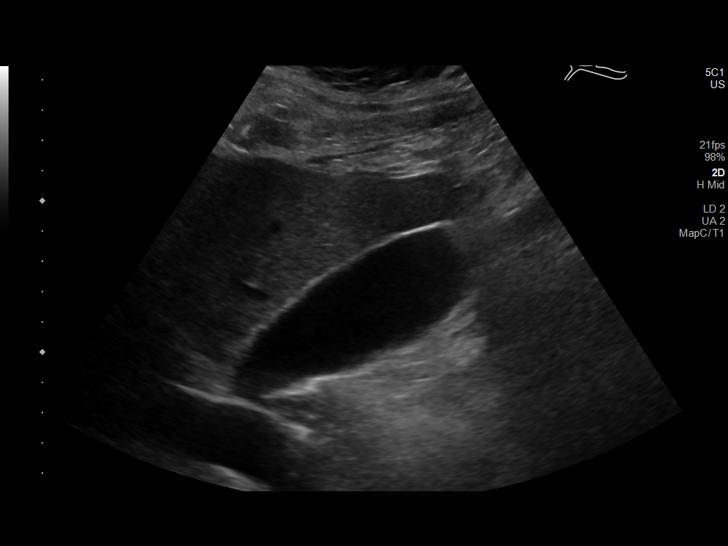
[im 6/35]
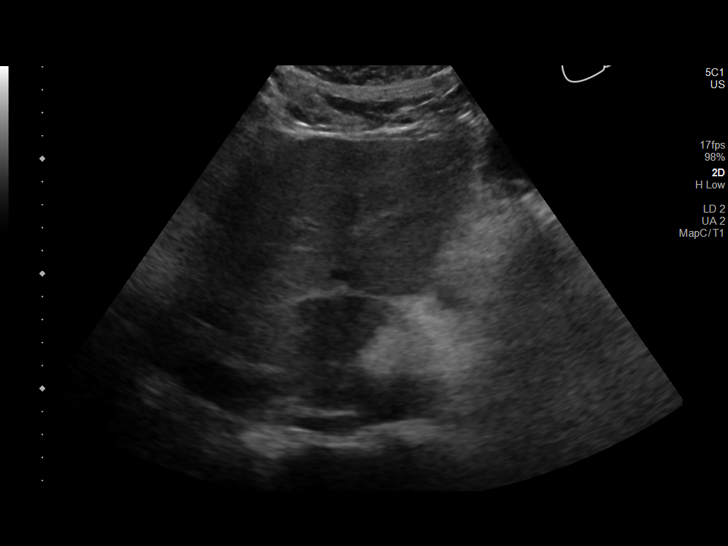
[im 9/35]
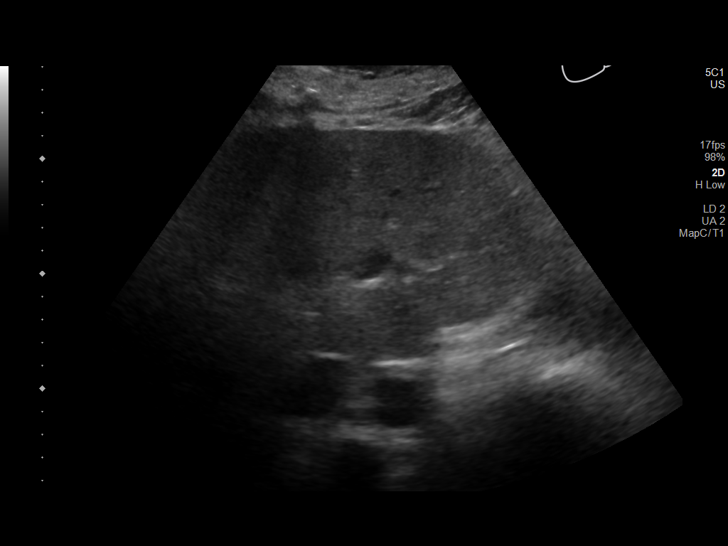
[im 12/35]
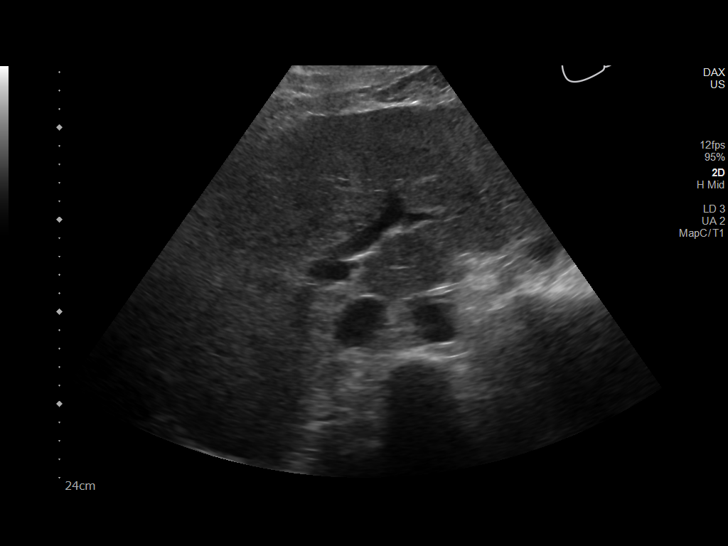
[im 13/35]
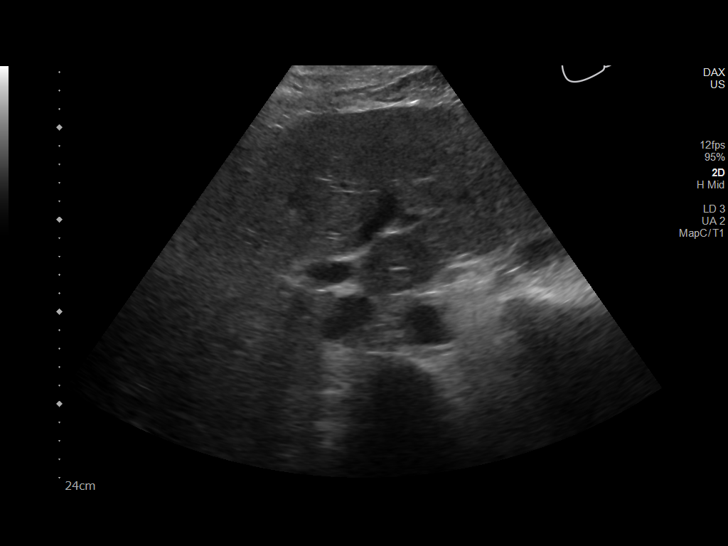
[im 16/35]
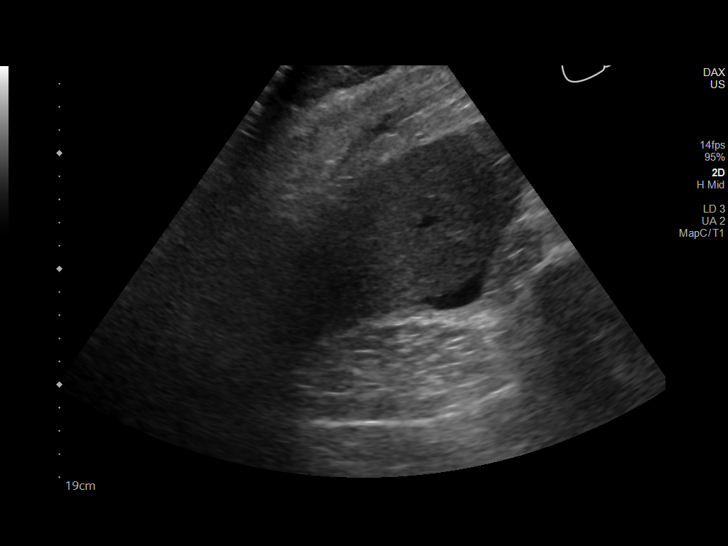
[im 19/35]
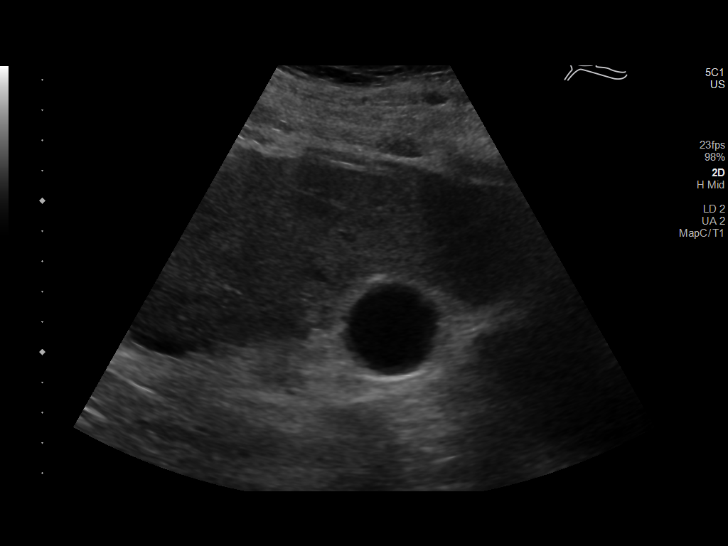
[im 22/35]
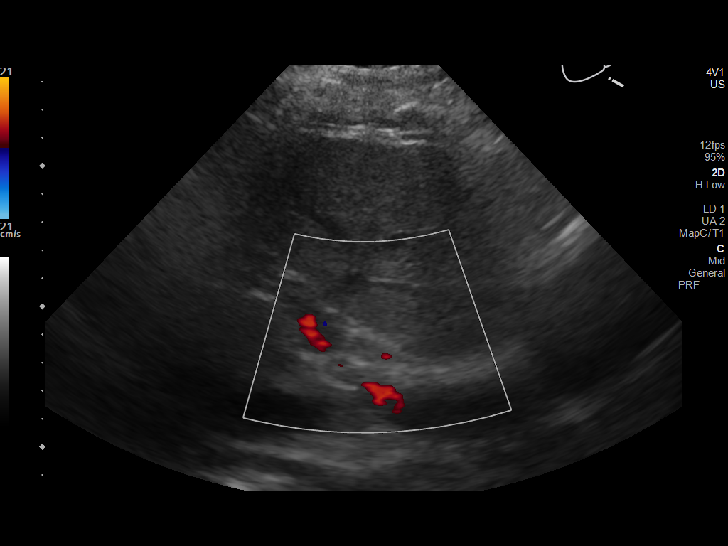
[im 23/35]
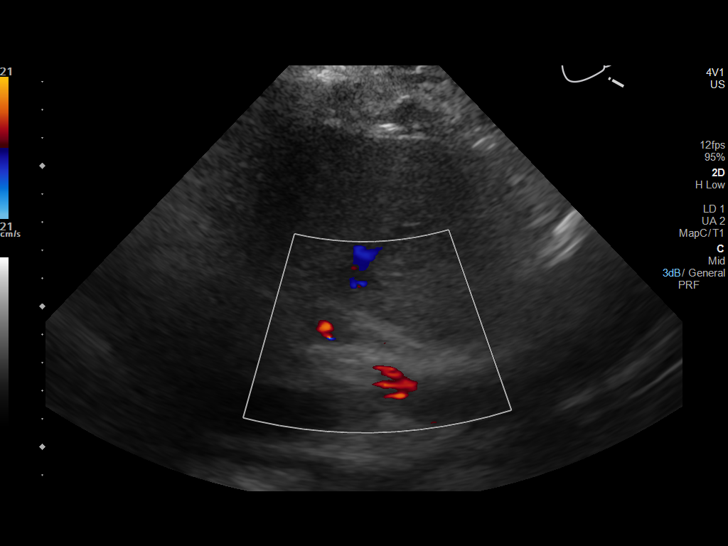
[im 26/35]
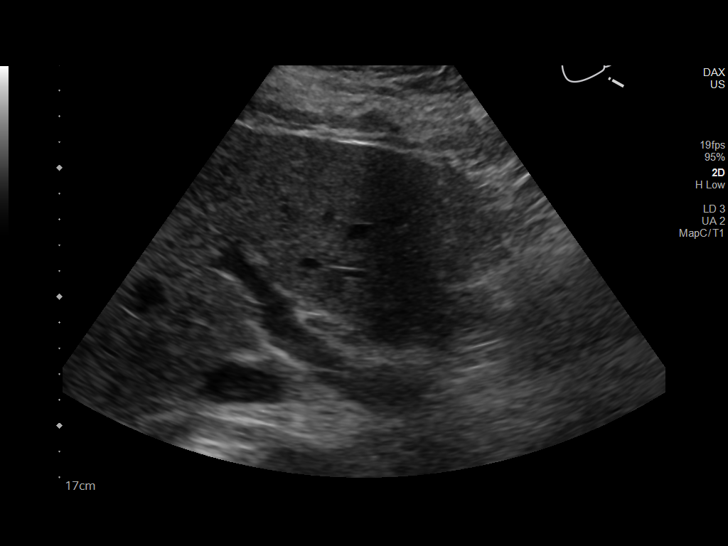
[im 29/35]
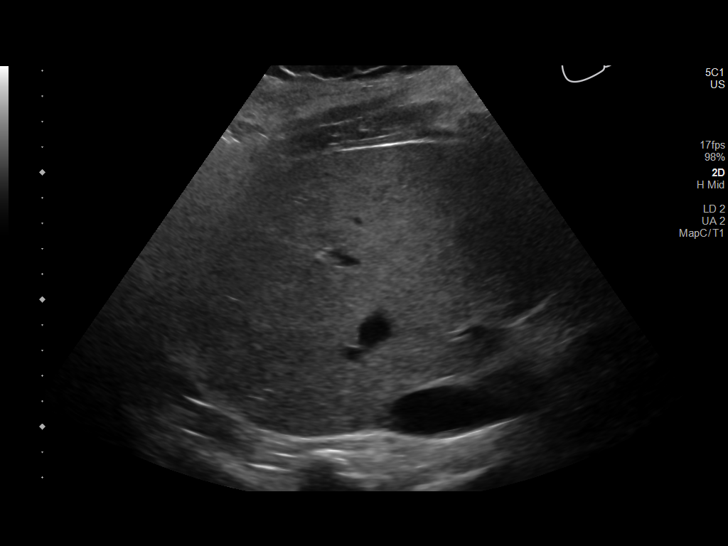
[im 32/35]
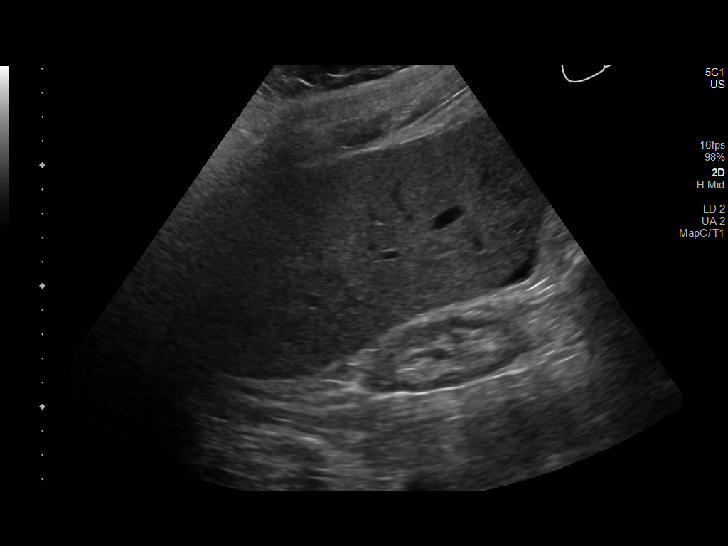
[im 35/35]
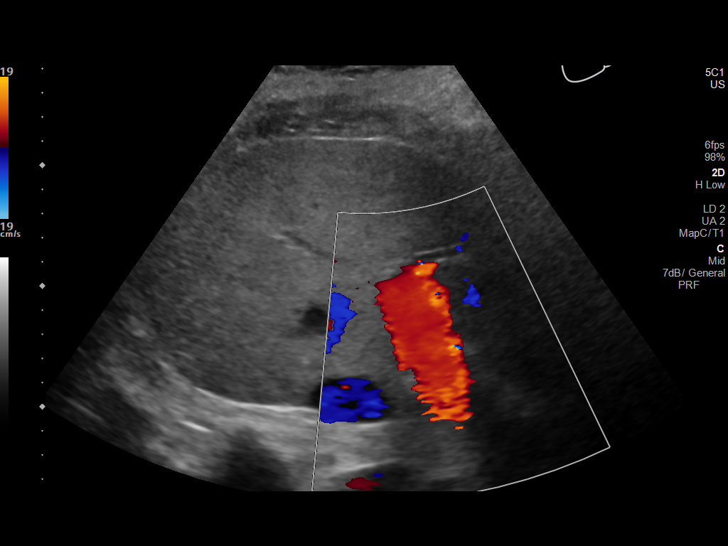

[14 of 25 positions shown; findings below may reference images not displayed]

FINDINGS: Gallbladder:

No gallstones or gallbladder wall thickening. No pericholecystic
fluid.

Common bile duct:

Diameter: 5 mm

Liver:

Liver appears echogenic suggesting fatty deposition. Portal vein is
patent on color Doppler imaging with normal direction of blood flow
towards the liver.

Other: Trace fluid noted adjacent to the liver.
IMPRESSION: Patient is on a ventilator which limits assessment due to inability
to reposition as normal part of the exam. Within this limitation,
gallbladder is unremarkable and there is no evidence for biliary
dilatation.

Increased echogenicity of liver parenchyma suggests fatty
deposition.

Trace fluid adjacent to the liver.

## 2020-05-22 IMAGING — DX PORTABLE CHEST - 1 VIEW
1 series · 1 of 1 positions shown · non-contrast
Comparison: 12/02/2018.

CLINICAL DATA: Respiratory failure.  Hypoxia.

EXAM:
PORTABLE CHEST 1 VIEW

[chest ap]
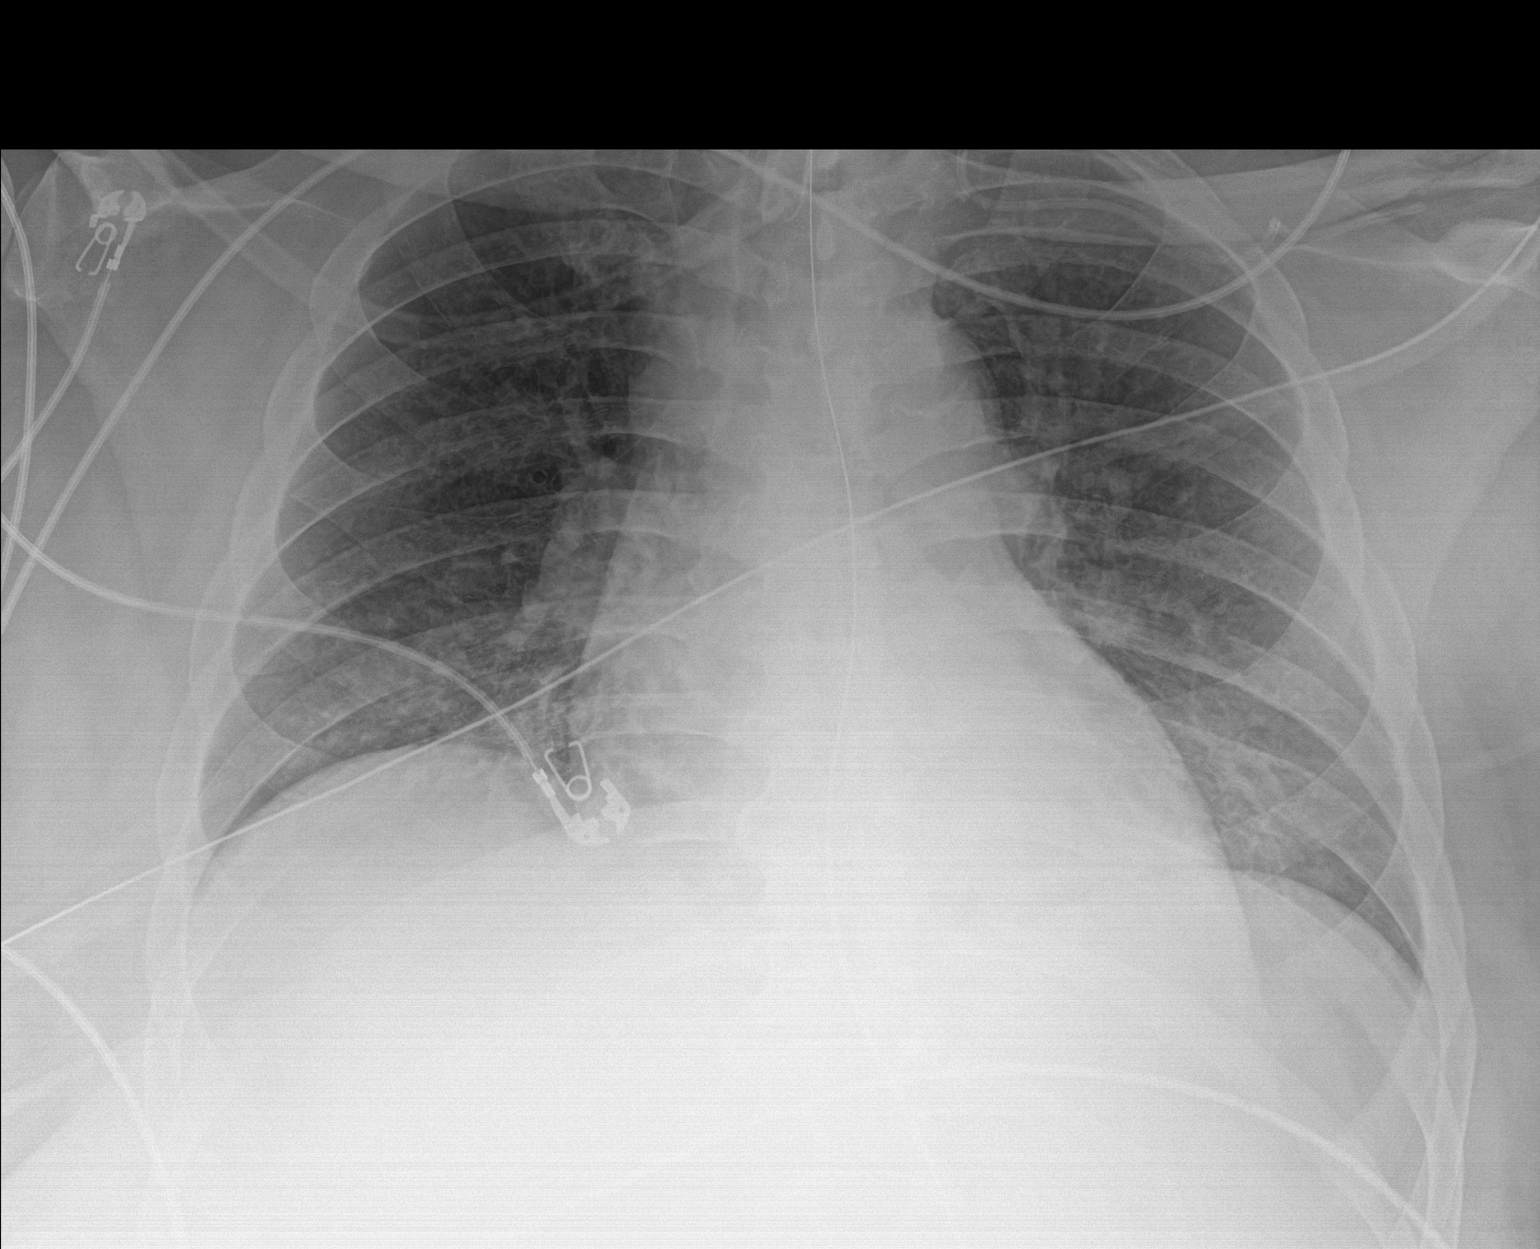

[1 of 1 positions shown; findings below may reference images not displayed]

FINDINGS: Endotracheal tube and NG tube in stable position. Left subclavian
catheter is again noted projected over the left internal jugular
vein. Cardiomegaly. Interim partial clearing of bilateral pulmonary
interstitial infiltrates/edema. No pleural effusion or pneumothorax.
IMPRESSION: 1.  Lines and tubes in unchanged position.

2. Cardiomegaly. Interim partial clearing of bilateral pulmonary
interstitial infiltrates/edema.

## 2020-05-24 IMAGING — DX PORTABLE CHEST - 1 VIEW
1 series · 1 of 1 positions shown · non-contrast
Comparison: Chest x-rays dated 12/04/2018 and 12/03/2018

CLINICAL DATA: Acute respiratory failure

EXAM:
PORTABLE CHEST 1 VIEW

[chest ap]
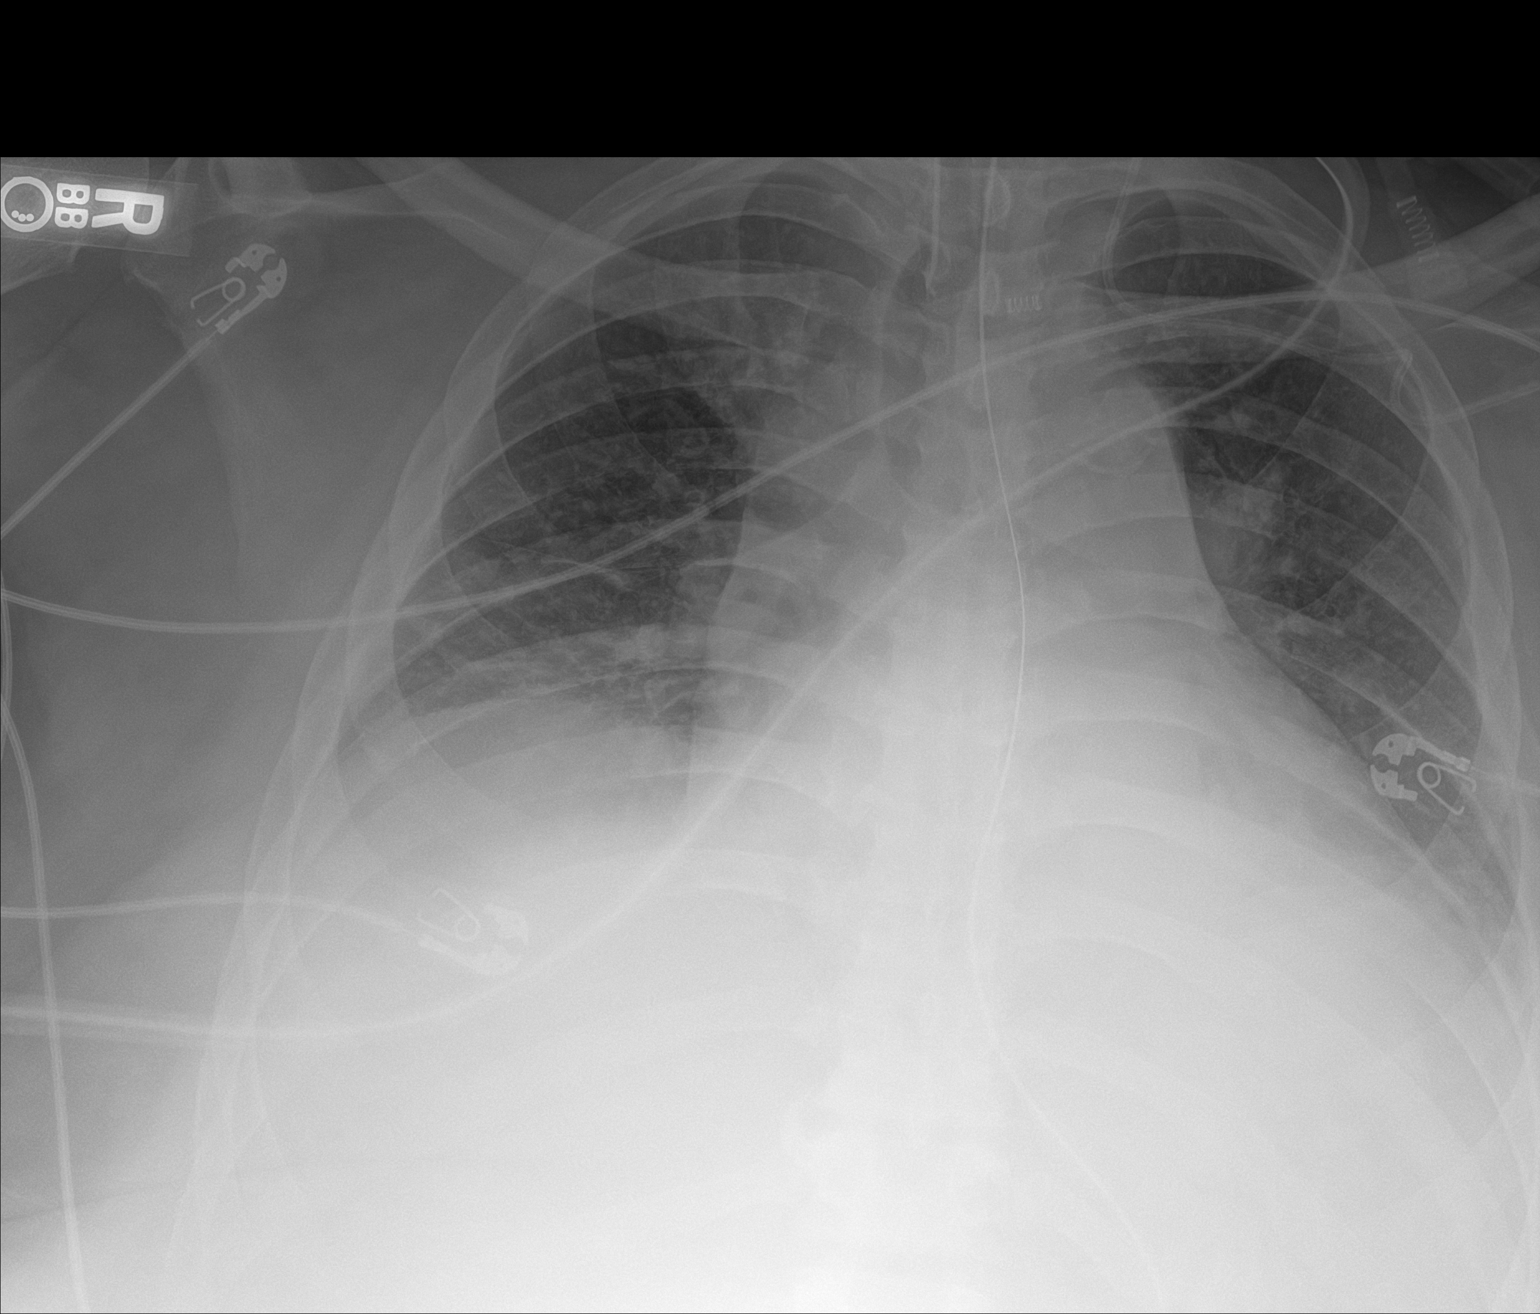

[1 of 1 positions shown; findings below may reference images not displayed]

FINDINGS: Stable cardiomegaly. Endotracheal tube is adequately positioned with
tip approximately 5 cm above the carina. Enteric tube passes below
the diaphragm. LEFT subclavian central line with tip again directed
upwards into the internal jugular vein.

Streaky bibasilar opacities, most likely atelectasis. No new lung
findings. No pleural effusion or pneumothorax seen.
IMPRESSION: 1. Endotracheal tube adequately positioned with tip approximately 5
cm above the carina.
2. Stable cardiomegaly.
3. Streaky bibasilar opacities, most likely atelectasis.
4. LEFT subclavian central line with tip directed upwards into the
LEFT internal jugular vein, as previously described.

## 2020-05-26 IMAGING — DX PORTABLE CHEST - 1 VIEW
1 series · 1 of 1 positions shown · non-contrast
Comparison: 12/06/2018.

CLINICAL DATA: Respiratory failure.

EXAM:
PORTABLE CHEST 1 VIEW

[chest ap]
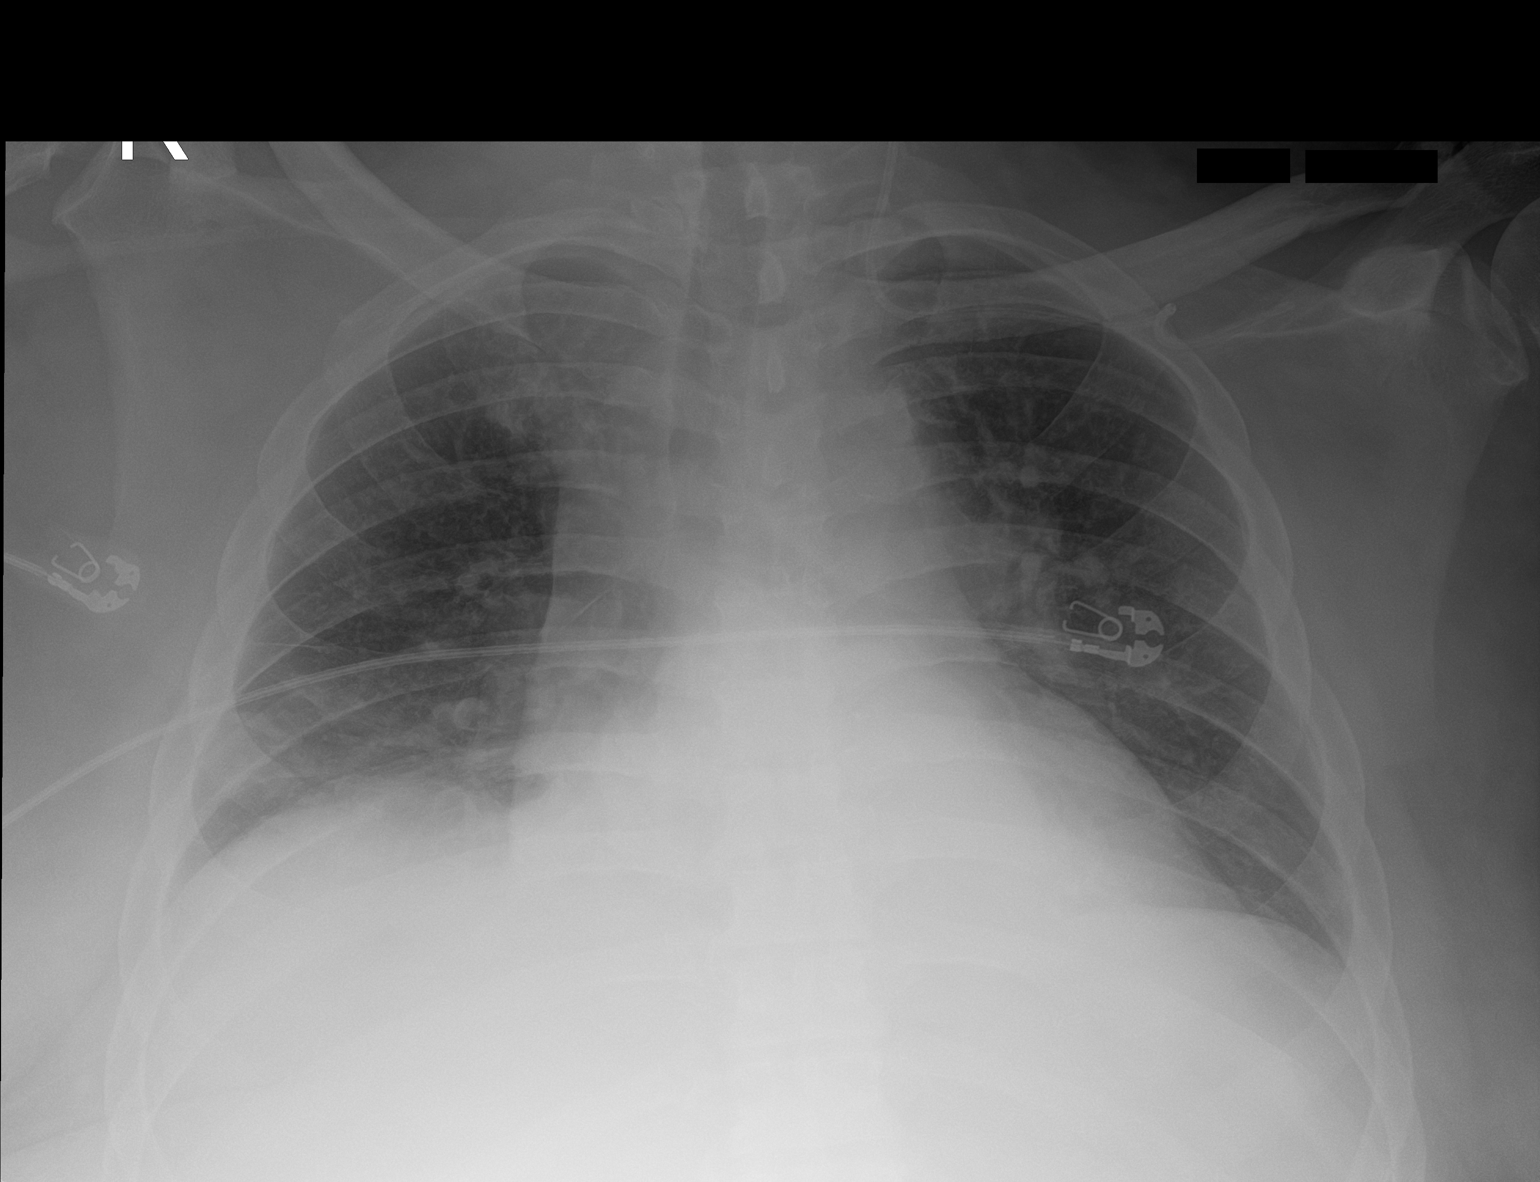

[1 of 1 positions shown; findings below may reference images not displayed]

FINDINGS: Interim extubation removal of NG tube. Left subclavian line again
noted coursing up the left IJ. Cardiomegaly with mild pulmonary
venous congestion again noted. Stable bibasilar
atelectasis/infiltrates. No pleural effusion or pneumothorax.
IMPRESSION: 1. Interim extubation removal of NG tube. Left subclavian line again
noted coursing up the left IJ.

2. Cardiomegaly with mild pulmonary venous congestion again noted.
No interim change.

3. Stable bibasilar atelectasis/infiltrates. No pleural effusion or
pneumothorax noted.

## 2020-12-05 ENCOUNTER — Other Ambulatory Visit: Payer: Self-pay

## 2020-12-05 ENCOUNTER — Ambulatory Visit (INDEPENDENT_AMBULATORY_CARE_PROVIDER_SITE_OTHER): Payer: Medicare Other | Admitting: Family Medicine

## 2020-12-05 ENCOUNTER — Telehealth: Payer: Self-pay

## 2020-12-05 ENCOUNTER — Encounter: Payer: Self-pay | Admitting: Family Medicine

## 2020-12-05 VITALS — BP 100/64 | HR 87 | Temp 96.9°F | Ht 74.0 in | Wt 296.4 lb

## 2020-12-05 DIAGNOSIS — Z720 Tobacco use: Secondary | ICD-10-CM

## 2020-12-05 DIAGNOSIS — Z992 Dependence on renal dialysis: Secondary | ICD-10-CM

## 2020-12-05 DIAGNOSIS — F1290 Cannabis use, unspecified, uncomplicated: Secondary | ICD-10-CM

## 2020-12-05 DIAGNOSIS — I5032 Chronic diastolic (congestive) heart failure: Secondary | ICD-10-CM

## 2020-12-05 DIAGNOSIS — E1129 Type 2 diabetes mellitus with other diabetic kidney complication: Secondary | ICD-10-CM

## 2020-12-05 DIAGNOSIS — D638 Anemia in other chronic diseases classified elsewhere: Secondary | ICD-10-CM

## 2020-12-05 DIAGNOSIS — I509 Heart failure, unspecified: Secondary | ICD-10-CM | POA: Insufficient documentation

## 2020-12-05 DIAGNOSIS — N186 End stage renal disease: Secondary | ICD-10-CM

## 2020-12-05 DIAGNOSIS — E039 Hypothyroidism, unspecified: Secondary | ICD-10-CM

## 2020-12-05 DIAGNOSIS — I959 Hypotension, unspecified: Secondary | ICD-10-CM | POA: Insufficient documentation

## 2020-12-05 DIAGNOSIS — I953 Hypotension of hemodialysis: Secondary | ICD-10-CM | POA: Insufficient documentation

## 2020-12-05 LAB — COMPREHENSIVE METABOLIC PANEL
ALT: 10 U/L (ref 0–53)
AST: 11 U/L (ref 0–37)
Albumin: 4.2 g/dL (ref 3.5–5.2)
Alkaline Phosphatase: 38 U/L — ABNORMAL LOW (ref 39–117)
BUN: 41 mg/dL — ABNORMAL HIGH (ref 6–23)
CO2: 31 mEq/L (ref 19–32)
Calcium: 9.3 mg/dL (ref 8.4–10.5)
Chloride: 93 mEq/L — ABNORMAL LOW (ref 96–112)
Creatinine, Ser: 11.36 mg/dL (ref 0.40–1.50)
GFR: 4.9 mL/min — CL (ref >60.00)
Glucose, Bld: 93 mg/dL (ref 70–99)
Potassium: 4.4 mEq/L (ref 3.5–5.1)
Sodium: 138 mEq/L (ref 135–145)
Total Bilirubin: 0.5 mg/dL (ref 0.2–1.2)
Total Protein: 7.4 g/dL (ref 6.0–8.3)

## 2020-12-05 LAB — LIPID PANEL
Cholesterol: 215 mg/dL — ABNORMAL HIGH (ref 0–200)
HDL: 35.3 mg/dL — ABNORMAL LOW (ref >39.00)
LDL Cholesterol: 146 mg/dL — ABNORMAL HIGH (ref 0–99)
NonHDL: 179.57
Total CHOL/HDL Ratio: 6
Triglycerides: 168 mg/dL — ABNORMAL HIGH (ref 0.0–149.0)
VLDL: 33.6 mg/dL (ref 0.0–40.0)

## 2020-12-05 LAB — CBC
HCT: 45.6 % (ref 39.0–52.0)
Hemoglobin: 15.2 g/dL (ref 13.0–17.0)
MCHC: 33.4 g/dL (ref 30.0–36.0)
MCV: 95.4 fl (ref 78.0–100.0)
Platelets: 207 10*3/uL (ref 150.0–400.0)
RBC: 4.78 Mil/uL (ref 4.22–5.81)
RDW: 17.1 % — ABNORMAL HIGH (ref 11.5–15.5)
WBC: 5.5 10*3/uL (ref 4.0–10.5)

## 2020-12-05 LAB — HEMOGLOBIN A1C: Hgb A1c MFr Bld: 6.5 % (ref 4.6–6.5)

## 2020-12-05 LAB — TSH: TSH: 1.9 u[IU]/mL (ref 0.35–4.50)

## 2020-12-05 LAB — PHOSPHORUS: Phosphorus: 6 mg/dL — ABNORMAL HIGH (ref 2.3–4.6)

## 2020-12-05 NOTE — Progress Notes (Signed)
[facility] [address] [name] [address] Dept: [phone] Dept Fax: [phone]  Transfer of Care Office Visit  Subjective:    Patient ID: [name], male    DOB: 1976/04/25, 45 y.o..   MRN: [id number]  Chief Complaint  Patient presents with   Establish Care    NP- establish care.  C/o having low BP due to being on dialysis. 95/59 a month ago.     History of Present Illness:  Patient is in today to establish care. Mr. Aragones is originally from [place], [place]. His family moved to [facility] when he was about 89 years old (his parents were [place] natives). He is single and has no children. He is not currently employed. He admits to smoking occasional cigarettes  (about 1 every 3-4 days). He admits to drinking socially. He admits to daily marijuana use, smoking 1-2 joints per day.  Mr. Bowlby has a history of kidney failure since ~ 2008, secondary to malignant hypertension. He was initially treated with continuous peritoneal dialysis. In May 2014, he underwent kidney transplant at [facility]. About 5 years after his transplant, he developed renal failure again and was started on hemodialysis. He had attempts at creating functioning AV grafts in both UEs, which apparently failed. He now has an AV graft in the left groin. He currently has HD done 3 days a week at [facility]. He had been on the transplant list for another kidney. However, Mr. Dierks had developed issues with hypotension. The transplant team removed him from the list until such time as the blood pressure issues could be evaluated and/or resolved. Apparently, he was placed on midodrine for blood pressure support earlier this year, though he is not on this currently.  Mr. Auman chart indicates that he has a history of Type 2 diabetes and a history of hypothyroidism. He states that he doesn't believe he has either condition. He speculates that these may have been added during  one or more of his prior admissions to the hospital. He notes that he is not on medication for either.  Mr. Berkovich chart indicates he has atrial fibrillation. He notes that this was an issue in ~ 2005, but that this resolved. He attributes the resolution of this and several other health issues to having lost a considerable amount of weight. He states at one point, his weight was near 400 lbs.  Mr. Hausen has a history of OSA. he notes this improved with his weight loss. He no longer uses a CPAP at night.  Mr. Dobratz chart indicates a history of acute respiratory failure with hypoxia. He notes this occurred in 2020 when he had a severe pneumonia. At that point, he was placed on a ventilator. He denies any respiratory complaints at this point. His chart also indicates a prior issue with an acute encephalopathy. He is not aware of what that might refer to.  Past Medical History: Patient Active Problem List   Diagnosis Date Noted   Hemodialysis-associated hypotension 12/05/2020   CHF (congestive heart failure) (HCC)    Tobacco use    Marijuana use, continuous    Labile blood glucose    Anemia of chronic disease    Debility 12/13/2018   Medically noncompliant    Acute encephalopathy    Genital warts 11/03/2018   Meralgia paresthetica of right side 08/13/2018   ESRD on dialysis ([place]) 06/04/2018   Type 2 diabetes mellitus with other diabetic kidney complication ([place]) 09/98/3382   Paroxysmal  atrial fibrillation ([place]) 05/24/2018   Hypothyroidism, unspecified 05/24/2018   Essential hypertension 01/25/2017   Morbid obesity ([place]) 01/17/2016   Delayed graft function of kidney transplant due to reason other than ATN or rejection requiring acute dialysis ([place]) 11/02/2012   S/P kidney transplant 11/02/2012   OSA (obstructive sleep apnea) 05/26/2011   Past Surgical History:  Procedure Laterality Date   ARTERIOVENOUS GRAFT PLACEMENT  08/09/2010   Left Thigh Graft by Dr. [name]   AV  FISTULA PLACEMENT Right 05/18/2018   Procedure: INSERTION OF ARTERIOVENOUS (AV) GORE-TEX GRAFT Left THIGH;  Surgeon: Waynetta Sandy, MD;  Location: [facility];  Service: Vascular;  Laterality: Right;   CAPD REMOVAL  05/08/2011   Procedure: CONTINUOUS AMBULATORY PERITONEAL DIALYSIS  (CAPD) CATHETER REMOVAL;  Surgeon: [name], MD;  Location: [facility];  Service: General;  Laterality: N/A;  Removal of CAPD catheter, Dr. requests to go after 100   INSERTION OF DIALYSIS CATHETER  10/05/2010   Right Femoral Cath insertion by Dr. [name].  Pt ahas had several caths inserted.   IR FLUORO GUIDE CV LINE RIGHT  05/11/2018   IR US GUIDE VASC ACCESS RIGHT  05/11/2018   KIDNEY TRANSPLANT  2014   KNEE ARTHROSCOPY Left    UPPER EXTREMITY ANGIOGRAPHY Bilateral 05/13/2018   Procedure: UPPER EXTREMITY ANGIOGRAPHY - bilarteral;  Surgeon: Marty Heck, MD;  Location: [facility];  Service: Cardiovascular;  Laterality: Bilateral;   Family History  Problem Relation Age of Onset   Diabetes Father    Stroke Father    Stroke Maternal Grandmother    Anesthesia problems Neg Hx    Outpatient Medications Prior to Visit  Medication Sig Dispense Refill   ferric citrate (AURYXIA) 1 GM 210 MG(Fe) tablet Takes 3 tablets with each meal     acetaminophen (TYLENOL) 325 MG tablet Take 2 tablets (650 mg total) by mouth every 6 (six) hours as needed for mild pain (or Fever >/= 101). (Patient not taking: No sig reported)     atorvastatin (LIPITOR) 20 MG tablet Take 20 mg by mouth daily. (Patient not taking: Reported on 12/05/2020)     diclofenac sodium (VOLTAREN) 1 % GEL Apply 2 g topically 4 (four) times daily. (Patient not taking: Reported on 12/05/2020) 350 g 1   imiquimod (ALDARA) 5 % cream Apply thin layer to affected areas three times per week prior to bed. (Patient not taking: No sig reported) 12 each 1   sucroferric oxyhydroxide (VELPHORO) 500 MG chewable tablet Chew 4 tablets (2,000 mg total)  by mouth 3 (three) times daily with meals. (Patient not taking: Reported on 12/05/2020) 90 tablet 0   No facility-administered medications prior to visit.   No Known Allergies    Objective:   Today's Vitals   12/05/20 0934  BP: 100/64  Pulse: 87  Temp: (!) 96.9 F (36.1 C)  TempSrc: Temporal  SpO2: 99%  Weight: 296 lb 6.4 oz (134.4 kg)  Height: 6\' 2"  (1.88 m)   Body mass index is 38.06 kg/m.   General: Well developed, well nourished. No acute distress. Psych: Alert and oriented. Normal mood and affect.  Health Maintenance Due  Topic Date Due   COVID-19 Vaccine (1) Never done   Pneumococcal Vaccine 107-82 Years old (1 - PCV) Never done   FOOT EXAM  Never done   URINE MICROALBUMIN  Never done   Hepatitis C Screening  Never done   HEMOGLOBIN A1C  11/10/2018   OPHTHALMOLOGY EXAM  05/19/2019  COLONOSCOPY (Pts 45-78yrs Insurance coverage will need to be confirmed)  Never done   Imaging: Stress Echo (04/08/2020) IMPRESSIONS   1. Fair exercise capacity, achieved 8.6 METS   2. Hypertensive response to exercise   3. Upsloping ST depression with exercise, no evidence of ischemia.   4. This is a negative stress echocardiogram for ischemia.   5. This is a low risk study.   Echocardiogram (12/01/2018) IMPRESSIONS   1. The left ventricle has normal systolic function, with an ejection fraction of 55-60%. The cavity size was normal. There is moderately increased left ventricular wall thickness. Left ventricular diastolic  Doppler parameters are consistent with impaired relaxation. No evidence of left ventricular regional wall motion abnormalities. D-shaped interventricular septum suggestive of RV pressure/volume overload.   2. Right atrial size was mildly dilated.   3. Trivial pericardial effusion is present.   4. The aortic valve is tricuspid. Mild calcification of the aortic valve. No stenosis of the aortic valve.   5. The aortic root is normal in size and structure.   6. There is  mild dilatation of the ascending aorta measuring 42 mm.   7. The right ventricle has severely reduced systolic function. The cavity was moderately enlarged. There is no increase in right ventricular wall thickness.   8. The inferior vena cava was dilated in size with <50% respiratory variability. PA systolic pressure 45 mmHg.   9. No evidence of mitral valve stenosis. No significant mitral regurgitation.     Assessment & Plan:   1. ESRD on dialysis [facility]) Mr. Agostino is currently receiving hemodialysis three days a week. I will check some labs for monitoring, so I can be aware of his status related to this therapy.  - Comprehensive metabolic panel - Phosphorus - PTH, intact (no Ca)  2. Hemodialysis-associated hypotension Hypotension can be a common finding for patients on hemodialysis.  It is not clear to me why the transplant team removed him form the transplant list without also initiating some evaluation for the hypotension and addressing this. Mr. Hoban notes that his nephrologist that supervises his HD does not take time to address such issues. I am uncertain what avenue to take to try and help him address this barrier to his transplantation, but will start with a cardiology referral.  - Ambulatory referral to Cardiology  3. Type 2 diabetes mellitus with other diabetic kidney complication (HCC) To help confirm or refute the diagnosis of diabetes, I will check labs today.  - Hemoglobin A1c - Lipid panel  4. Hypothyroidism, unspecified type Likewise, I will check a TSH to see if there is evidence of hypothyroidism.  - TSH  5. Morbid obesity ([place]) By history, Mr. Bottger has had a near 100 lb. weight loss. This certainly would have had benefits related to severalof his health conditions.  6. Anemia of chronic disease By history, Mr. Locklin has had issues with anemia. I will check a CBC to determine the current status of this.  - CBC  7. Chronic diastolic congestive heart  failure (HCC) Historically, this was noted, but his last echocardiogram does not show evidence of current heart failure.  8. Tobacco use Recommend he stop using, even his low level of tobacco use.  9. Marijuana use, continuous Potentially, this would be a concern for his transplant suitability.  I spent 120 minutes reviewing extensive past medical reports, labs, and consult notes, taking the patient's medical history, devising a treatment plan and documenting his encounter.   Haydee Salter, MD

## 2020-12-06 ENCOUNTER — Encounter: Payer: Self-pay | Admitting: Family Medicine

## 2020-12-06 DIAGNOSIS — E785 Hyperlipidemia, unspecified: Secondary | ICD-10-CM | POA: Insufficient documentation

## 2020-12-06 LAB — PARATHYROID HORMONE, INTACT (NO CA): PTH: 114 pg/mL — ABNORMAL HIGH (ref 16–77)

## 2020-12-07 ENCOUNTER — Encounter: Payer: Self-pay | Admitting: Family Medicine

## 2020-12-07 DIAGNOSIS — N2581 Secondary hyperparathyroidism of renal origin: Secondary | ICD-10-CM | POA: Insufficient documentation

## 2020-12-13 NOTE — Telephone Encounter (Signed)
Pt critical value   Critical Value  [facility]: [name], [name]  [birth year]  [id number]  Critical Creatinine:11.36  Critical: GFR: 4.90  1:10 pm

## 2020-12-26 ENCOUNTER — Encounter: Payer: Self-pay | Admitting: Cardiovascular Disease

## 2020-12-26 ENCOUNTER — Ambulatory Visit (INDEPENDENT_AMBULATORY_CARE_PROVIDER_SITE_OTHER): Payer: Medicare Other | Admitting: Cardiovascular Disease

## 2020-12-26 ENCOUNTER — Other Ambulatory Visit: Payer: Self-pay

## 2020-12-26 VITALS — BP 88/58 | HR 106 | Ht 74.0 in | Wt 292.8 lb

## 2020-12-26 DIAGNOSIS — E785 Hyperlipidemia, unspecified: Secondary | ICD-10-CM

## 2020-12-26 DIAGNOSIS — I700 Atherosclerosis of aorta: Secondary | ICD-10-CM

## 2020-12-26 DIAGNOSIS — R0989 Other specified symptoms and signs involving the circulatory and respiratory systems: Secondary | ICD-10-CM

## 2020-12-26 DIAGNOSIS — N186 End stage renal disease: Secondary | ICD-10-CM | POA: Diagnosis not present

## 2020-12-26 DIAGNOSIS — Z8679 Personal history of other diseases of the circulatory system: Secondary | ICD-10-CM

## 2020-12-26 DIAGNOSIS — Z6837 Body mass index (BMI) 37.0-37.9, adult: Secondary | ICD-10-CM

## 2020-12-26 DIAGNOSIS — I959 Hypotension, unspecified: Secondary | ICD-10-CM

## 2020-12-26 DIAGNOSIS — I5032 Chronic diastolic (congestive) heart failure: Secondary | ICD-10-CM

## 2020-12-26 DIAGNOSIS — E1122 Type 2 diabetes mellitus with diabetic chronic kidney disease: Secondary | ICD-10-CM

## 2020-12-26 DIAGNOSIS — Z992 Dependence on renal dialysis: Secondary | ICD-10-CM

## 2020-12-26 DIAGNOSIS — G4733 Obstructive sleep apnea (adult) (pediatric): Secondary | ICD-10-CM

## 2020-12-26 NOTE — Progress Notes (Signed)
Cardiology Office Note:    Date:  12/27/2020   ID:  [name], DOB May 02, 1976, MRN [id number]  PCP:  Haydee Salter, MD   [facility] Providers Cardiologist:  Sanda Klein, MD [name] is a 45 y.o. male who is being seen today for the evaluation of hypotension at the request of [name], Lillette Boxer, MD.     Referring MD: Haydee Salter, MD   Chief Complaint  Patient presents with   Hypotension    History of Present Illness:    [name] is a 45 y.o. male with a hx of longstanding end-stage renal disease since 2006, attributable to malignant hypertension at a young age.  For a while he was on peritoneal dialysis, but his peritoneal catheter has been removed. He received a transplanted kidney in 2008 (DDKT), that eventually failed in 2014 and he has been back on dialysis ever since.   He has had numerous AV fistulas placed in both upper extremities over the years that have repeatedly failed and he is now being dialyzed through a left thigh AV graft.  He is trying to get another kidney transplant through [facility], but has been rejected due to persistent problems with low blood pressure.  He does not qualify for transplantation if his blood pressure is elevated with medication such as midodrine.  He has a history of morbid obesity but has lost over 100 pounds from his peak weight over 400 pounds.  He has lost 70 pounds in just the last 12 months.  His blood pressure has steadily improved with weight loss.  He reports that he initially presented with congestive heart failure and paroxysmal atrial fibrillation in 2001, when he was only 45 years old, but has not had any cardiac issues since he started hemodialysis.  He has a history of obstructive sleep apnea but the symptoms of this completely resolved after weight loss.  He does not use CPAP.  He is repeatedly hypotensive at dialysis (TTS, Dr. Moshe Cipro), but denies ever feeling bad when he was  told that he is hypotensive.  His blood pressure has been as low as 76/50 and he did not feel dizziness or presyncope.  He never interrupts hemodialysis and is able to continue removing fluid.  His highest blood pressure is usually around 100/64, checked in both arms.  He is recently started exercising and running even to help with his weight loss and feels well.  He denies exertional angina or dyspnea.  He does not have palpitations.  He only gets dizzy if he jumps out of a chair too quickly and this resolves promptly.  He did very well on his first echocardiogram in October 2021 when he was able to exercise to 8.6 METS and had a "hypertensive response" to exercise, without ECG changes or echocardiographic abnormalities.  His transthoracic echo from June 2020 reports normal left ventricular systolic function with moderate LVH, aortic valve sclerosis without stenosis and mild dilation of the ascending aorta at 42 mm.  The right atrium was described as mildly dilated, the right ventricle was moderately enlarged and had severely reduced systolic function and the estimated PA systolic pressure was 45 mmHg.  In 2020 he had acute respiratory failure requiring intubation due to a severe pneumonia.  Despite his impressive weight loss, he remains severely obese.  He has diabetes mellitus but is not taking any medications with a recent hemoglobin A1c of 6.5%.  He has dyslipidemia (LDL 146, HDL 35), not  on medications.  He has incidentally noted aortic atherosclerosis on chest x-ray from June 2020, asymptomatic. Past Medical History:  Diagnosis Date   Acute respiratory failure with hypoxia ([place]) 11/30/2018   Anemia    ESRD   CHF (congestive heart failure) ([place])    Diabetes mellitus without complication ([place])    ESRD on hemodialysis ([place]) 06/07/2011   TTS [place]. Started HD in 2006, got transplant in 2014 lasted until Dec 2019 then went back on HD.  Has L thigh AVG as of Jun 2020.  Failed PD in the past due to  recurrent infection.    GERD (gastroesophageal reflux disease)    Hypertension 111/29/2012   pt states he takes no HTN meds   Hypothyroidism, secondary    Morbid obesity (HCC)    Paroxysmal atrial fibrillation (HCC)    Sepsis ([place]) 11/30/2018   Sleep apnea 06/07/2011   Pt had sllep study done 2 weeks ago- Dr. Joellyn Quails.  Does not  have a CPAP at this time.    Past Surgical History:  Procedure Laterality Date   ARTERIOVENOUS GRAFT PLACEMENT  08/09/2010   Left Thigh Graft by Dr. [name]   AV FISTULA PLACEMENT Right 05/18/2018   Procedure: INSERTION OF ARTERIOVENOUS (AV) GORE-TEX GRAFT Left THIGH;  Surgeon: Waynetta Sandy, MD;  Location[place];  Service: Vascular;  Laterality: Right;   CAPD REMOVAL  05/08/2011   Procedure: CONTINUOUS AMBULATORY PERITONEAL DIALYSIS  (CAPD) CATHETER REMOVAL;  Surgeon: [name], MD;  Location: [place];  Service: General;  Laterality: N/A;  Removal of CAPD catheter, Dr. requests to go after 100   [place]  10/05/2010   Right Femoral Cath insertion by Dr. [name].  Pt ahas had several caths inserted.   IR FLUORO GUIDE CV LINE RIGHT  05/11/2018   IR US GUIDE VASC ACCESS RIGHT  05/11/2018   KIDNEY TRANSPLANT  2014   KNEE ARTHROSCOPY Left    UPPER EXTREMITY ANGIOGRAPHY Bilateral 05/13/2018   Procedure: UPPER EXTREMITY ANGIOGRAPHY - bilarteral;  Surgeon: Marty Heck, MD;  Location: [place] CV [facility];  Service: Cardiovascular;  Laterality: Bilateral;    Current Medications: Current Meds  Medication Sig   ferric citrate (AURYXIA) 1 GM 210 MG(Fe) tablet Takes 3 tablets with each meal     Allergies:   Patient has no known allergies.   Social History   Socioeconomic History   Marital status: Single    Spouse name: Not on file   Number of children: 0   Years of education: Not on file   Highest education level: Not on file  Occupational History   Not on file  Tobacco Use   Smoking status: Some  Days    Types: Cigarettes   Smokeless tobacco: Never   Tobacco comments:    Smokes a cigarette about every 3-4 days.  Vaping Use   Vaping Use: Never used  Substance and Sexual Activity   Alcohol use: Yes    Comment: Socially   Drug use: Yes    Frequency: 10.0 times per week    Types: Marijuana    Comment: 1-2 joints per day   Sexual activity: Yes  Other Topics Concern   Not on file  Social History Narrative   Lives alone.    Social Determinants of Health   Financial Resource Strain: Not on file  Food Insecurity: Not on file  Transportation Needs: Not on file  Physical Activity: Not on file  Stress: Not on  file  Social Connections: Not on file     Family History: The patient's family history includes Diabetes in his father; Stroke in his father and maternal grandmother. There is no history of Anesthesia problems.  ROS:   Please see the history of present illness.     All other systems reviewed and are negative.  EKGs/Labs/Other Studies Reviewed:    The following studies were reviewed today: Stress Echo (04/08/2020) IMPRESSIONS   1. Fair exercise capacity, achieved 8.6 METS   2. Hypertensive response to exercise   3. Upsloping ST depression with exercise, no evidence of ischemia.   4. This is a negative stress echocardiogram for ischemia.   5. This is a low risk study.    Echocardiogram (12/01/2018) Note that this study was performed during hospitalization with acute respiratory failure due to pneumonia IMPRESSIONS   1. The left ventricle has normal systolic function, with an ejection fraction of 55-60%. The cavity size was normal. There is moderately increased left ventricular wall thickness. Left ventricular diastolic  Doppler parameters are consistent with impaired relaxation. No evidence of left ventricular regional wall motion abnormalities. D-shaped interventricular septum suggestive of RV pressure/volume overload.   2. Right atrial size was mildly dilated.    3. Trivial pericardial effusion is present.   4. The aortic valve is tricuspid. Mild calcification of the aortic valve. No stenosis of the aortic valve.   5. The aortic root is normal in size and structure.   6. There is mild dilatation of the ascending aorta measuring 42 mm.   7. The right ventricle has severely reduced systolic function. The cavity was moderately enlarged. There is no increase in right ventricular wall thickness.   8. The inferior vena cava was dilated in size with <50% respiratory variability. PA systolic pressure 45 mmHg.   9. No evidence of mitral valve stenosis. No significant mitral regurgitation.      EKG:  EKG is ordered today.  The ekg ordered today demonstrates mild sinus tachycardia, generalized low voltage, left anterior fascicular block.  QTC borderline 464 ms  Recent Labs: 12/05/2020: ALT 10; BUN 41; Creatinine, Ser 11.36; Hemoglobin 15.2; Platelets 207.0; Potassium 4.4; Sodium 138; TSH 1.90  Recent Lipid Panel    Component Value Date/Time   CHOL 215 (H) 12/05/2020 1021   TRIG 168.0 (H) 12/05/2020 1021   HDL 35.30 (L) 12/05/2020 1021   CHOLHDL 6 12/05/2020 1021   VLDL 33.6 12/05/2020 1021   LDLCALC 146 (H) 12/05/2020 1021     Risk Assessment/Calculations:           Physical Exam:    VS:  BP (!) 88/58 (BP Location: Left Arm, Patient Position: Sitting, Cuff Size: Large)   Pulse (!) 106   Ht 6\' 2"  (1.88 m)   Wt 292 lb 12.8 oz (132.8 kg)   SpO2 96%   BMI 37.59 kg/m     I rechecked his blood pressure myself in both arms at 82/51 mmHg.  Wt Readings from Last 3 Encounters:  12/26/20 292 lb 12.8 oz (132.8 kg)  12/05/20 296 lb 6.4 oz (134.4 kg)  04/11/19 (!) 308 lb (139.7 kg)     GEN: Severely obese well nourished, well developed in no acute distress HEENT: Normal NECK: No JVD; No carotid bruits LYMPHATICS: No lymphadenopathy CARDIAC: Mildly tachycardic RRR, no murmurs, rubs, gallops.  Radial pulses are weak in both upper extremities.  The  blood pressure is equal in the right left upper extremity.  Scars of multiple previous AV  fistula/grafts are seen in both upper extremities.  He has a right groin AV graft with good thrill/bruit.  He has 2+ pedal pulses bilaterally.  There are no audible carotid bruits or subclavian bruits RESPIRATORY:  Clear to auscultation without rales, wheezing or rhonchi  ABDOMEN: Soft, non-tender, non-distended MUSCULOSKELETAL:  No edema; No deformity  SKIN: Warm and dry NEUROLOGIC:  Alert and oriented x 3 PSYCHIATRIC:  Normal affect   ASSESSMENT:    1. Hypotension, unspecified hypotension type   2. Pulse weakness   3. ESRD on dialysis ([place])   4. History of malignant hypertension   5. History of atrial fibrillation   6. Chronic diastolic congestive heart failure ([place])   7. OSA (obstructive sleep apnea)   8. Aortic atherosclerosis ([place])   9. Dyslipidemia (high LDL; low HDL)   10. Type 2 diabetes mellitus with end-stage renal disease ([place])   11. Class 2 severe obesity due to excess calories with serious comorbidity and body mass index (BMI) of 37.0 to 37.9 in adult [facility])    PLAN:    In order of problems listed above:  Hypotension: There is a striking discrepancy between the severity of his hypotension and the almost complete absence of symptoms of hypotension.  He has good pulses in his feet, but unfortunately I do not have the appropriate equipment to check the blood pressure or ABIs today in his legs.  I wonder whether his hypotension is artifactual.  There may have been a significant injury to his upper extremity arterial supply during repeated attempts to gain appropriate AV access for dialysis.  We will schedule him for formal ABI analysis. ESRD on HD: He tolerates dialysis without complaints, despite the fact that his blood pressure is reportedly severely decreased. HTN: Reportedly kidney failure is due to malignant hypertension.  This makes his current issues with low blood pressure readings  even more puzzling. Remote history of paroxysmal atrial fibrillation: This is mentioned in his diagnoses but have not been able to find any clear confirmation of this in the chart.  I looked through the [place] at our situation I find no ECGs with atrial fibrillation going back all the way to 2004.  He believes this only happened when he had his initial presentation with heart failure in 2001. History of congestive heart failure: He has moderate LVH by echo and evidence of impaired relaxation, but has not had overt heart failure in years.  His echocardiogram from 2019 did show supranormal filling, consistent with elevated left atrial filling pressures, but as far as I know this was asymptomatic. OSA: He has lost 100 pounds or more and believes that this is "cured".  He denies daytime hypersomnolence.  He is no longer using CPAP.  His echocardiogram from 2020 showed severe right ventricular dysfunction, but at that time he had acute cor pulmonale due to hypoxic respiratory failure/pneumonia.  Echocardiogram from 2019 showed normal right ventricular size and systolic function and reported a normal size right atrium.  I have reviewed both echocardiograms and agree with the interpretation on both.  It appears that he had transient right heart failure/dilation due to the acute illness, rather than chronic right heart failure from sleep apnea. Aortic atherosclerosis: Asymptomatic.  Mildly dilated ascending aorta per the echocardiogram, but he is very tall, broad shouldered and for him 4.2 cm probably a very mild degree of dilation.  Consider reevaluating this with a CT angiogram. HLP: He has dyslipidemia with elevated LDL and low HDL.  Even just with  a diagnosis of diabetes mellitus his target LDL is less than 100, if we acknowledge the aortic atherosclerosis as being an equivalent to CAD/PAD his target LDL should be less than 70.  I have strongly encouraged him to consider taking lipid-lowering medications  (statins).  This has already been suggested to him by his primary care provider and he states that he will probably go ahead with a recommendation. DM: Adequate control without medications, recent hemoglobin A1c 6.5%.  He has lost a lot of weight.  He plans to continue losing weight. Severe obesity: He is to be congratulated for the excellent job he is done losing weight.  He remains in the severely obese range.       Medication Adjustments/Labs and Tests Ordered: Current medicines are reviewed at length with the patient today.  Concerns regarding medicines are outlined above.  Orders Placed This Encounter  Procedures   EKG 12-Lead   VAS [place] ABI WITH/WO TBI   No orders of the defined types were placed in this encounter.   Patient Instructions  Medication Instructions:  No changes *If you need a refill on your cardiac medications before your next appointment, please call your pharmacy*   Lab Work: None ordered If you have labs (blood work) drawn today and your tests are completely normal, you will receive your results only by: [place] (if you have MyChart) OR A paper copy in the mail If you have any lab test that is abnormal or we need to change your treatment, we will call you to review the results.   Testing/Procedures: Your physician has requested that you have an ankle brachial index (ABI). During this test an ultrasound and blood pressure cuff are used to evaluate the arteries that supply the arms and legs with blood. Allow thirty minutes for this exam. There are no restrictions or special instructions. This will take place at [place], [address].   Follow-Up: At [facility], you and your health needs are our priority.  As part of our continuing mission to provide you with exceptional heart care, we have created designated Provider Care Teams.  These Care Teams include your primary Cardiologist (physician) and Advanced Practice Providers (APPs -  Physician  Assistants and Nurse Practitioners) who all work together to provide you with the care you need, when you need it.  We recommend signing up for the patient portal called "MyChart".  Sign up information is provided on this After Visit Summary.  MyChart is used to connect with patients for Virtual Visits (Telemedicine).  Patients are able to view lab/test results, encounter notes, upcoming appointments, etc.  Non-urgent messages can be sent to your provider as well.   To learn more about what you can do with MyChart, go to [email or web address].    Your next appointment:   6 month(s)  The format for your next appointment:   In Person  Provider:   Sanda Klein, MD     Signed, Sanda Klein, MD  12/27/2020 2:44 PM    [place]

## 2020-12-26 NOTE — Patient Instructions (Signed)
Medication Instructions:  No changes *If you need a refill on your cardiac medications before your next appointment, please call your pharmacy*   Lab Work: None ordered If you have labs (blood work) drawn today and your tests are completely normal, you will receive your results only by: [place] (if you have MyChart) OR A paper copy in the mail If you have any lab test that is abnormal or we need to change your treatment, we will call you to review the results.   Testing/Procedures: Your physician has requested that you have an ankle brachial index (ABI). During this test an ultrasound and blood pressure cuff are used to evaluate the arteries that supply the arms and legs with blood. Allow thirty minutes for this exam. There are no restrictions or special instructions. This will take place at [place], [address].   Follow-Up: At [facility], you and your health needs are our priority.  As part of our continuing mission to provide you with exceptional heart care, we have created designated Provider Care Teams.  These Care Teams include your primary Cardiologist (physician) and Advanced Practice Providers (APPs -  Physician Assistants and Nurse Practitioners) who all work together to provide you with the care you need, when you need it.  We recommend signing up for the patient portal called "MyChart".  Sign up information is provided on this After Visit Summary.  MyChart is used to connect with patients for Virtual Visits (Telemedicine).  Patients are able to view lab/test results, encounter notes, upcoming appointments, etc.  Non-urgent messages can be sent to your provider as well.   To learn more about what you can do with MyChart, go to [email or web address].    Your next appointment:   6 month(s)  The format for your next appointment:   In Person  Provider:   Sanda Klein, MD

## 2020-12-28 ENCOUNTER — Ambulatory Visit (HOSPITAL_COMMUNITY)
Admission: RE | Admit: 2020-12-28 | Discharge: 2020-12-28 | Disposition: A | Discharge status: Home / Self Care | Payer: Medicare Other | Source: Ambulatory Visit | Attending: Cardiovascular Disease | Admitting: Cardiovascular Disease

## 2020-12-28 ENCOUNTER — Other Ambulatory Visit: Payer: Self-pay

## 2020-12-28 DIAGNOSIS — R0989 Other specified symptoms and signs involving the circulatory and respiratory systems: Secondary | ICD-10-CM

## 2021-01-04 ENCOUNTER — Other Ambulatory Visit: Payer: Self-pay

## 2021-01-07 ENCOUNTER — Ambulatory Visit: Payer: BC Managed Care – PPO | Admitting: Family Medicine

## 2021-01-14 ENCOUNTER — Telehealth: Payer: Self-pay

## 2021-01-14 NOTE — Telephone Encounter (Signed)
Amy from [place] [phone]), called saying pt called reporting no thrill or bruit in thigh graft.  Amy wants him to be seen today before he dialyzes tomorrow at 05:30am. Per Dr. Trula Slade, since 2008 we do not deal with occluded thigh grafts outside of 30 days.  Go to IR.  She can also call Dr. Otelia Santee.  Amy verbalized understanding.

## 2021-02-01 ENCOUNTER — Other Ambulatory Visit: Payer: Self-pay

## 2021-02-01 ENCOUNTER — Ambulatory Visit (INDEPENDENT_AMBULATORY_CARE_PROVIDER_SITE_OTHER): Payer: Medicare Other | Admitting: Physician Assistant

## 2021-02-01 VITALS — BP 100/73 | HR 103 | Temp 97.7°F | Resp 20 | Ht 74.0 in | Wt 288.2 lb

## 2021-02-01 DIAGNOSIS — N186 End stage renal disease: Secondary | ICD-10-CM | POA: Diagnosis not present

## 2021-02-01 DIAGNOSIS — Z992 Dependence on renal dialysis: Secondary | ICD-10-CM | POA: Diagnosis not present

## 2021-02-01 NOTE — H&P (View-Only) (Signed)
[facility] OF [place] HISTORY AND PHYSICAL   History of Present Illness:  Patient is a 45 y.o. year old male who presents for placement of a permanent hemodialysis access.  He has had multiple UE access's that failed with time, a kidney transplant in the past and 2 left femoral loop grafts.  The last left femoral loop graft became occluded 2 weeks ago.  CK vascular attempted salvage and ended up placing a right femoral TDC.  We have   Past Medical History:  Diagnosis Date   Acute respiratory failure with hypoxia ([place]) 11/30/2018   Anemia    ESRD   CHF (congestive heart failure) ([place])    Diabetes mellitus without complication ([place])    ESRD on hemodialysis ([place]) 06/07/2011   TTS [facility]. Started HD in 2006, got transplant in 2014 lasted until Dec 2019 then went back on HD.  Has L thigh AVG as of Jun 2020.  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    GERD (gastroesophageal reflux disease)    Hypertension 111/29/2012   pt states he takes no HTN meds   Hypothyroidism, secondary    Morbid obesity (HCC)    Paroxysmal atrial fibrillation (HCC)    Sepsis ([place]) 11/30/2018   Sleep apnea 06/07/2011   Pt had sllep study done 2 weeks ago- Dr. Joellyn Quails.  Does not  have a CPAP at this time.    Past Surgical History:  Procedure Laterality Date   ARTERIOVENOUS GRAFT PLACEMENT  08/09/2010   Left Thigh Graft by Dr. [name]   AV FISTULA PLACEMENT Right 05/18/2018   Procedure: INSERTION OF ARTERIOVENOUS (AV) GORE-TEX GRAFT Left THIGH;  Surgeon: Waynetta Sandy, MD;  Location: [facility];  Service: Vascular;  Laterality: Right;   CAPD REMOVAL  05/08/2011   Procedure: CONTINUOUS AMBULATORY PERITONEAL DIALYSIS  (CAPD) CATHETER REMOVAL;  Surgeon: [name], MD;  Location: [facility];  Service: General;  Laterality: N/A;  Removal of CAPD catheter, Dr. requests to go after 100   [place]  10/05/2010   Right Femoral Cath insertion by Dr. [name].  Pt ahas had several caths inserted.   IR FLUORO GUIDE CV LINE RIGHT  05/11/2018   IR US GUIDE VASC ACCESS RIGHT  05/11/2018   KIDNEY TRANSPLANT  2014   KNEE ARTHROSCOPY Left    UPPER EXTREMITY ANGIOGRAPHY Bilateral 05/13/2018   Procedure: UPPER EXTREMITY ANGIOGRAPHY - bilarteral;  Surgeon: Marty Heck, MD;  Location: [facility];  Service: Cardiovascular;  Laterality: Bilateral;     Social History Social History   Tobacco Use   Smoking status: Some Days    Types: Cigarettes   Smokeless tobacco: Never   Tobacco comments:    Smokes a cigarette about every 3-4 days.  Vaping Use   Vaping Use: Never used  Substance Use Topics   Alcohol use: Yes    Comment: Socially   Drug use: Yes    Frequency: 10.0 times per week    Types: Marijuana    Comment: 1-2 joints per day    Family History Family History  Problem Relation Age of Onset   Diabetes Father    Stroke Father    Stroke Maternal Grandmother    Anesthesia problems Neg Hx     Allergies  No Known Allergies   Current Outpatient Medications  Medication Sig Dispense Refill   ferric citrate (AURYXIA) 1 GM 210 MG(Fe) tablet Takes 3 tablets with each meal  No current facility-administered medications for this visit.    ROS:   General:  No weight loss, Fever, chills  HEENT: No recent headaches, no nasal bleeding, no visual changes, no sore throat  Neurologic: No dizziness, blackouts, seizures. No recent symptoms of stroke or mini- stroke. No recent episodes of slurred speech, or temporary blindness.  Cardiac: No recent episodes of chest pain/pressure, no shortness of breath at rest.  No shortness of breath with exertion.  Denies history of atrial fibrillation or irregular heartbeat  Vascular: No history of rest pain in feet.  No history of claudication.  No history of non-healing ulcer, No history of DVT   Pulmonary: No home oxygen, no productive cough, no hemoptysis,  No asthma or  wheezing  Musculoskeletal:  [ ]  Arthritis, [ ]  Low back pain,  [ ]  Joint pain  Hematologic:No history of hypercoagulable state.  No history of easy bleeding.  No history of anemia  Gastrointestinal: No hematochezia or melena,  No gastroesophageal reflux, no trouble swallowing  Urinary: [ ]  chronic Kidney disease, [ ]  on HD - [ ]  MWF or [ ]  TTHS, [ ]  Burning with urination, [ ]  Frequent urination, [ ]  Difficulty urinating;   Skin: No rashes  Psychological: No history of anxiety,  No history of depression   Physical Examination  Vitals:   02/01/21 0933  BP: 100/73  Pulse: (!) 103  Resp: 20  Temp: 97.7 F (36.5 C)  TempSrc: Temporal  SpO2: 98%  Weight: 288 lb 3.2 oz (130.7 kg)  Height: 6\' 2"  (1.88 m)    Body mass index is 37 kg/m.  General:  Alert and oriented, no acute distress HEENT: Normal Neck: No bruit or JVD Pulmonary: Clear to auscultation bilaterally Cardiac: Regular Rate and Rhythm without murmur Gastrointestinal: Soft, non-tender, non-distended, no mass, no scars Skin: No rash Extremity Pulses:  2+ radial, brachial pulses bilaterally Musculoskeletal: No deformity or edema  Neurologic: Upper and lower extremity motor 5/5 and symmetric  DATA:     ABI Findings:  +---------+------------------+-----+--------+--------+  Right    Rt Pressure (mmHg)IndexWaveformComment   +---------+------------------+-----+--------+--------+  Brachial 94                                       +---------+------------------+-----+--------+--------+  ATA      115               1.22 biphasic          +---------+------------------+-----+--------+--------+  PTA      117               1.24 biphasic          +---------+------------------+-----+--------+--------+  PERO     108               1.15 biphasic          +---------+------------------+-----+--------+--------+  Great Toe62                0.66 Abnormal           +---------+------------------+-----+--------+--------+   +---------+------------------+-----+---------+-------+  Left     Lt Pressure (mmHg)IndexWaveform Comment  +---------+------------------+-----+---------+-------+  Brachial 87                                       +---------+------------------+-----+---------+-------+  ATA      96  1.02 biphasic          +---------+------------------+-----+---------+-------+  PTA      109               1.16 triphasic         +---------+------------------+-----+---------+-------+  PERO     91                0.97 triphasic         +---------+------------------+-----+---------+-------+  Great Toe109               1.16 Abnormal          +---------+------------------+-----+---------+-------+   +-------+-----------+-----------+------------+------------+  ABI/TBIToday's ABIToday's TBIPrevious ABIPrevious TBI  +-------+-----------+-----------+------------+------------+  Right  1.24       .66                                  +-------+-----------+-----------+------------+------------+  Left   1.16       1.16                                 +-------+-----------+-----------+------------+------------+   ASSESSMENT/Plan: ESRD He had ABI's in July which show biphasic inflow with calcified vessels B LE.  Plan will be to place right chest [facility]( Dr. Donzetta Matters looked at the central veins with duplex today in clinic.) and right femoral loop graft.       Roxy Horseman PA-C [facility] [place] Office: [phone]  MD in clinic [place]

## 2021-02-01 NOTE — Progress Notes (Addendum)
[facility] OF [place] HISTORY AND PHYSICAL   History of Present Illness:  Patient is a 45 y.o. year old male who presents for placement of a permanent hemodialysis access.  He has had multiple UE access's that failed with time, a kidney transplant in the past and 2 left femoral loop grafts.  The last left femoral loop graft became occluded 2 weeks ago.  CK vascular attempted salvage and ended up placing a right femoral TDC.  We have   Past Medical History:  Diagnosis Date   Acute respiratory failure with hypoxia ([place]) 11/30/2018   Anemia    ESRD   CHF (congestive heart failure) ([place])    Diabetes mellitus without complication ([place])    ESRD on hemodialysis ([place]) 06/07/2011   TTS [facility]. Started HD in 2006, got transplant in 2014 lasted until Dec 2019 then went back on HD.  Has L thigh AVG as of Jun 2020.  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    GERD (gastroesophageal reflux disease)    Hypertension 111/29/2012   pt states he takes no HTN meds   Hypothyroidism, secondary    Morbid obesity (HCC)    Paroxysmal atrial fibrillation (HCC)    Sepsis ([place]) 11/30/2018   Sleep apnea 06/07/2011   Pt had sllep study done 2 weeks ago- Dr. Joellyn Quails.  Does not  have a CPAP at this time.    Past Surgical History:  Procedure Laterality Date   ARTERIOVENOUS GRAFT PLACEMENT  08/09/2010   Left Thigh Graft by Dr. [name]   AV FISTULA PLACEMENT Right 05/18/2018   Procedure: INSERTION OF ARTERIOVENOUS (AV) GORE-TEX GRAFT Left THIGH;  Surgeon: Waynetta Sandy, MD;  Location: [facility];  Service: Vascular;  Laterality: Right;   CAPD REMOVAL  05/08/2011   Procedure: CONTINUOUS AMBULATORY PERITONEAL DIALYSIS  (CAPD) CATHETER REMOVAL;  Surgeon: [name], MD;  Location: [facility];  Service: General;  Laterality: N/A;  Removal of CAPD catheter, Dr. requests to go after 100   [place]  10/05/2010   Right Femoral Cath insertion by Dr. [name].  Pt ahas had several caths inserted.   IR FLUORO GUIDE CV LINE RIGHT  05/11/2018   IR US GUIDE VASC ACCESS RIGHT  05/11/2018   KIDNEY TRANSPLANT  2014   KNEE ARTHROSCOPY Left    UPPER EXTREMITY ANGIOGRAPHY Bilateral 05/13/2018   Procedure: UPPER EXTREMITY ANGIOGRAPHY - bilarteral;  Surgeon: Marty Heck, MD;  Location: [facility];  Service: Cardiovascular;  Laterality: Bilateral;     Social History Social History   Tobacco Use   Smoking status: Some Days    Types: Cigarettes   Smokeless tobacco: Never   Tobacco comments:    Smokes a cigarette about every 3-4 days.  Vaping Use   Vaping Use: Never used  Substance Use Topics   Alcohol use: Yes    Comment: Socially   Drug use: Yes    Frequency: 10.0 times per week    Types: Marijuana    Comment: 1-2 joints per day    Family History Family History  Problem Relation Age of Onset   Diabetes Father    Stroke Father    Stroke Maternal Grandmother    Anesthesia problems Neg Hx     Allergies  No Known Allergies   Current Outpatient Medications  Medication Sig Dispense Refill   ferric citrate (AURYXIA) 1 GM 210 MG(Fe) tablet Takes 3 tablets with each meal  No current facility-administered medications for this visit.    ROS:   General:  No weight loss, Fever, chills  HEENT: No recent headaches, no nasal bleeding, no visual changes, no sore throat  Neurologic: No dizziness, blackouts, seizures. No recent symptoms of stroke or mini- stroke. No recent episodes of slurred speech, or temporary blindness.  Cardiac: No recent episodes of chest pain/pressure, no shortness of breath at rest.  No shortness of breath with exertion.  Denies history of atrial fibrillation or irregular heartbeat  Vascular: No history of rest pain in feet.  No history of claudication.  No history of non-healing ulcer, No history of DVT   Pulmonary: No home oxygen, no productive cough, no hemoptysis,  No asthma or  wheezing  Musculoskeletal:  [ ]  Arthritis, [ ]  Low back pain,  [ ]  Joint pain  Hematologic:No history of hypercoagulable state.  No history of easy bleeding.  No history of anemia  Gastrointestinal: No hematochezia or melena,  No gastroesophageal reflux, no trouble swallowing  Urinary: [ ]  chronic Kidney disease, [ ]  on HD - [ ]  MWF or [ ]  TTHS, [ ]  Burning with urination, [ ]  Frequent urination, [ ]  Difficulty urinating;   Skin: No rashes  Psychological: No history of anxiety,  No history of depression   Physical Examination  Vitals:   02/01/21 0933  BP: 100/73  Pulse: (!) 103  Resp: 20  Temp: 97.7 F (36.5 C)  TempSrc: Temporal  SpO2: 98%  Weight: 288 lb 3.2 oz (130.7 kg)  Height: 6\' 2"  (1.88 m)    Body mass index is 37 kg/m.  General:  Alert and oriented, no acute distress HEENT: Normal Neck: No bruit or JVD Pulmonary: Clear to auscultation bilaterally Cardiac: Regular Rate and Rhythm without murmur Gastrointestinal: Soft, non-tender, non-distended, no mass, no scars Skin: No rash Extremity Pulses:  2+ radial, brachial pulses bilaterally Musculoskeletal: No deformity or edema  Neurologic: Upper and lower extremity motor 5/5 and symmetric  DATA:     ABI Findings:  +---------+------------------+-----+--------+--------+  Right    Rt Pressure (mmHg)IndexWaveformComment   +---------+------------------+-----+--------+--------+  Brachial 94                                       +---------+------------------+-----+--------+--------+  ATA      115               1.22 biphasic          +---------+------------------+-----+--------+--------+  PTA      117               1.24 biphasic          +---------+------------------+-----+--------+--------+  PERO     108               1.15 biphasic          +---------+------------------+-----+--------+--------+  Great Toe62                0.66 Abnormal           +---------+------------------+-----+--------+--------+   +---------+------------------+-----+---------+-------+  Left     Lt Pressure (mmHg)IndexWaveform Comment  +---------+------------------+-----+---------+-------+  Brachial 87                                       +---------+------------------+-----+---------+-------+  ATA      96  1.02 biphasic          +---------+------------------+-----+---------+-------+  PTA      109               1.16 triphasic         +---------+------------------+-----+---------+-------+  PERO     91                0.97 triphasic         +---------+------------------+-----+---------+-------+  Great Toe109               1.16 Abnormal          +---------+------------------+-----+---------+-------+   +-------+-----------+-----------+------------+------------+  ABI/TBIToday's ABIToday's TBIPrevious ABIPrevious TBI  +-------+-----------+-----------+------------+------------+  Right  1.24       .66                                  +-------+-----------+-----------+------------+------------+  Left   1.16       1.16                                 +-------+-----------+-----------+------------+------------+   ASSESSMENT/Plan: ESRD He had ABI's in July which show biphasic inflow with calcified vessels B LE.  Plan will be to place right chest [facility]( Dr. Donzetta Matters looked at the central veins with duplex today in clinic.) and right femoral loop graft.       Roxy Horseman PA-C [facility] [place] Office: [phone]  MD in clinic [place]

## 2021-02-05 ENCOUNTER — Other Ambulatory Visit: Payer: Self-pay

## 2021-02-06 ENCOUNTER — Other Ambulatory Visit: Payer: Self-pay

## 2021-02-06 ENCOUNTER — Encounter: Payer: Self-pay | Admitting: Family Medicine

## 2021-02-06 ENCOUNTER — Ambulatory Visit (INDEPENDENT_AMBULATORY_CARE_PROVIDER_SITE_OTHER): Payer: Medicare Other

## 2021-02-06 ENCOUNTER — Ambulatory Visit (INDEPENDENT_AMBULATORY_CARE_PROVIDER_SITE_OTHER): Payer: Medicare Other | Admitting: Family Medicine

## 2021-02-06 VITALS — BP 82/60 | HR 99 | Temp 97.1°F | Ht 74.0 in | Wt 295.8 lb

## 2021-02-06 DIAGNOSIS — Z992 Dependence on renal dialysis: Secondary | ICD-10-CM

## 2021-02-06 DIAGNOSIS — E782 Mixed hyperlipidemia: Secondary | ICD-10-CM

## 2021-02-06 DIAGNOSIS — L97522 Non-pressure chronic ulcer of other part of left foot with fat layer exposed: Secondary | ICD-10-CM

## 2021-02-06 DIAGNOSIS — N186 End stage renal disease: Secondary | ICD-10-CM | POA: Diagnosis not present

## 2021-02-06 DIAGNOSIS — G63 Polyneuropathy in diseases classified elsewhere: Secondary | ICD-10-CM

## 2021-02-06 DIAGNOSIS — E1129 Type 2 diabetes mellitus with other diabetic kidney complication: Secondary | ICD-10-CM

## 2021-02-06 DIAGNOSIS — M869 Osteomyelitis, unspecified: Secondary | ICD-10-CM

## 2021-02-06 DIAGNOSIS — G629 Polyneuropathy, unspecified: Secondary | ICD-10-CM | POA: Insufficient documentation

## 2021-02-06 LAB — CBC WITH DIFFERENTIAL/PLATELET
Basophils Absolute: 0.1 10*3/uL (ref 0.0–0.1)
Basophils Relative: 1.1 % (ref 0.0–3.0)
Eosinophils Absolute: 0.4 10*3/uL (ref 0.0–0.7)
Eosinophils Relative: 6.2 % — ABNORMAL HIGH (ref 0.0–5.0)
HCT: 39.7 % (ref 39.0–52.0)
Hemoglobin: 13.1 g/dL (ref 13.0–17.0)
Lymphocytes Relative: 22 % (ref 12.0–46.0)
Lymphs Abs: 1.5 10*3/uL (ref 0.7–4.0)
MCHC: 33.1 g/dL (ref 30.0–36.0)
MCV: 97.5 fl (ref 78.0–100.0)
Monocytes Absolute: 0.7 10*3/uL (ref 0.1–1.0)
Monocytes Relative: 10.6 % (ref 3.0–12.0)
Neutro Abs: 4.1 10*3/uL (ref 1.4–7.7)
Neutrophils Relative %: 60.1 % (ref 43.0–77.0)
Platelets: 192 10*3/uL (ref 150.0–400.0)
RBC: 4.07 Mil/uL — ABNORMAL LOW (ref 4.22–5.81)
RDW: 17.1 % — ABNORMAL HIGH (ref 11.5–15.5)
WBC: 6.7 10*3/uL (ref 4.0–10.5)

## 2021-02-06 LAB — SEDIMENTATION RATE: Sed Rate: 75 mm/hr — ABNORMAL HIGH (ref 0–15)

## 2021-02-06 LAB — C-REACTIVE PROTEIN: CRP: 2.8 mg/dL (ref 0.5–20.0)

## 2021-02-06 MED ORDER — ATORVASTATIN CALCIUM 40 MG PO TABS: 40.0000 mg | ORAL_TABLET | Freq: Every day | ORAL | 3 refills | Status: DC

## 2021-02-06 NOTE — Progress Notes (Addendum)
[facility] [address] [address] Dept: [phone] Dept Fax: [phone]  Office Visit  Subjective:    Patient ID: [name], male    DOB: 11-May-1976, 45 y.o..   MRN: [id number]  Chief Complaint  Patient presents with   Wound Check    Pt c/o of wound from cut on left toe will not heal x 23mo.     History of Present Illness:  Patient is in today for evaluation of a chronic left foot ulcer. He notes this started under the great tow about 3-4 months ago. He notes that he had been working at local wound care, including cleaning this with alcohol, but the wound has not resolved. He admits that he feels no pain associated with this and is surprised at that.  [name] a history of ESRD on dialysis and Type 2 diabetes. His most recent HbA1c was 6.5 and he is not on therapy. It is unclear how significant the diabetes amy have been in the past. He had previously been on the transplant list for his kidney failure. He was taken off, apparently due to hypotension. He was seen by Dr. CSallyanne Kuster(cardiology), who found his blood pressure readings in his arms to be inaccurate. He has normal pressures when this is checked in the legs. Dr. CRecardo Evangelisthas advised his transplant doctors of this assessment.  At his last visit, [name] found to have significantly elevated lipid levels. He notes that the cardiologist recommended he speak with his PCP regarding treatment for this.  Past Medical History: Patient Active Problem List   Diagnosis Date Noted   Secondary hyperparathyroidism of renal origin ([facility] 12/07/2020   Hyperlipidemia 12/06/2020   Hemodialysis-associated hypotension 12/05/2020   CHF (congestive heart failure) ([facility]    Tobacco use    Marijuana use, continuous    Debility 12/13/2018   Medically noncompliant    Acute encephalopathy    Genital warts 11/03/2018   Meralgia paresthetica of right side  08/13/2018   ESRD on dialysis ([facility] 06/04/2018   Type 2 diabetes mellitus with other diabetic kidney complication ([facility] 129/93/7169  Paroxysmal atrial fibrillation ([place] 05/24/2018   Essential hypertension 01/25/2017   Morbid obesity ([facility] 01/17/2016   Delayed graft function of kidney transplant due to reason other than ATN or rejection requiring acute dialysis ([facility] 11/02/2012   S/P kidney transplant 11/02/2012   OSA (obstructive sleep apnea) 05/26/2011    Past Surgical History:  Procedure Laterality Date   ARTERIOVENOUS GRAFT PLACEMENT  08/09/2010   Left Thigh Graft by Dr. CGae Gallop  AV FISTULA PLACEMENT Right 05/18/2018   Procedure: INSERTION OF ARTERIOVENOUS (AV) GORE-TEX GRAFT Left THIGH;  Surgeon: CWaynetta Sandy MD;  [facility]  Service: Vascular;  Laterality: Right;   CAPD REMOVAL  05/08/2011   Procedure: CONTINUOUS AMBULATORY PERITONEAL DIALYSIS  (CAPD) CATHETER REMOVAL;  Surgeon: WWilley Blade MD;  [facility]  Service: General;  Laterality: N/A;  Removal of CAPD catheter, Dr. requests to go after 100   INSERTION OF DIALYSIS CATHETER  10/05/2010   Right Femoral Cath insertion by Dr. BAdele Barthel  Pt ahas had several caths inserted.   IR FLUORO GUIDE CV LINE RIGHT  05/11/2018   IR [place] VASC ACCESS RIGHT  05/11/2018   KIDNEY TRANSPLANT  2014   KNEE ARTHROSCOPY Left    UPPER EXTREMITY ANGIOGRAPHY Bilateral 05/13/2018   Procedure: UPPER EXTREMITY ANGIOGRAPHY - bilarteral;  Surgeon: CCarlis Abbott  Gwenyth Allegra, MD;  Location: [facility];  Service: Cardiovascular;  Laterality: Bilateral;   Family History  Problem Relation Age of Onset   Diabetes Father    Stroke Father    Stroke Maternal Grandmother    Anesthesia problems Neg Hx    Outpatient Medications Prior to Visit  Medication Sig Dispense Refill   ferric citrate (AURYXIA) 1 GM 210 MG(Fe) tablet Takes 3 tablets with each meal     No facility-administered medications prior to visit.    No Known Allergies    Objective:   Today's Vitals   02/06/21 1006  BP: (!) 82/60  Pulse: 99  Temp: (!) 97.1 F (36.2 C)  TempSrc: Temporal  SpO2: 99%  Weight: 295 lb 12.8 oz (134.2 kg)  Height: _0  (1.88 m)   Body mass index is 37.98 kg/m.   General: Well developed, well nourished. No acute distress. Foot: There is a 1 cm ulcer over the plantar aspect of the left great toe, near the crease at the IP joint.   The wound base had a small amount of fibrinous material that was removed with a cotton swab. There   is some mild undermining of the wound edges and the skin is thickened at the edges. No purulent fluid   noted and no fluctuance. There is a hallux valgus deformity of the 1st MTP joint. Patient has no   sensation in the wound or the the toe. Psych: Alert and oriented. Normal mood and affect.  Health Maintenance Due  Topic Date Due   COVID-19 Vaccine (1) Never done   FOOT EXAM  Never done   URINE MICROALBUMIN  Never done   Hepatitis C Screening  Never done   Pneumococcal Vaccine 74-30 Years old (3 - PPSV23 or PCV20) 04/08/2017   OPHTHALMOLOGY EXAM  05/19/2019   COLONOSCOPY (Pts 45-52yr Insurance coverage will need to be confirmed)  Never done   INFLUENZA VACCINE  01/07/2021   Imaging: Left great toe- There is a small erosion on the medial aspect of the distal 1st metatarsal. There is hallux valgus present. There is a small osteophyte at the IP joint medially. It is unclear if this could represent a bone infection.    Lab results: Lab Results  Component Value Date   CHOL 215 (H) 12/05/2020   HDL 35.30 (L) 12/05/2020   LDLCALC 146 (H) 12/05/2020   TRIG 168.0 (H) 12/05/2020   CHOLHDL 6 12/05/2020    Assessment & Plan:   1. Skin ulcer of left great toe with fat layer exposed (HCecilia I advised [name] of my concern regarding his x-ray. I will await radiology's rad on this. I will check a CBC, ESR, and CRP to assess for possible infection. I dressed the wound  with gauze and Tegaderm. I will refer him to Wound Management for ongoing wound care.  - DG Toe Great Left; Future - Sedimentation rate - C-reactive protein - CBC with Differential/Platelet - AMB referral to wound care center - DG Toe Great Left  2. Type 2 diabetes mellitus with other diabetic kidney complication (HCC) Stable off meds.  3. ESRD on dialysis ([facility] Continue dialysis.  4. Mixed hyperlipidemia We will start Mr. Vera on a moderate dose statin and recheck lipids in 3 months.  - atorvastatin (LIPITOR) 40 MG tablet; Take 1 tablet (40 mg total) by mouth daily.  Dispense: 90 tablet; Refill: 3  5. Polyneuropathy associated with underlying disease ([facility] Patient apparently has peripheral neuropathy. It is unclear  if this is from diabetes or some other etiology. This was a risk factor for development of his current foot ulcer.   Haydee Salter, MD  Addendum:  Received radiologist read on x-ray of left great toe: IMPRESSION: 1. Findings suggestive of subacute to chronic osteomyelitis involving the medial aspect of the great toe distal phalanx. 2. Well-defined marginal erosion along the medial aspect of the first metatarsal head with overlying soft tissue thickening. Appearance is more suggestive of a crystalline arthropathy such as gout.  Lab Results  Component Value Date   WBC 6.7 02/06/2021   HGB 13.1 02/06/2021   HCT 39.7 02/06/2021   MCV 97.5 02/06/2021   PLT 192.0 02/06/2021   Component Ref Range & Units 2 d ago   CRP 0.5 - 20.0 mg/dL 2.8    Component Ref Range & Units 2 d ago   Sed Rate 0 - 15 mm/hr 75 High     I called and discussed the findings of osteomyelitis of the distal left 1st phalanx. I will refer him to orthopedics for urgent consultation and likely operative debridement followed by antibiotics.

## 2021-02-08 ENCOUNTER — Telehealth: Payer: Self-pay

## 2021-02-08 ENCOUNTER — Ambulatory Visit: Payer: BC Managed Care – PPO | Admitting: Cardiovascular Disease

## 2021-02-08 ENCOUNTER — Encounter: Payer: Self-pay | Admitting: Family Medicine

## 2021-02-08 DIAGNOSIS — M869 Osteomyelitis, unspecified: Secondary | ICD-10-CM | POA: Insufficient documentation

## 2021-02-08 NOTE — Addendum Note (Signed)
Addended by: Haydee Salter on: 02/08/2021 02:10 PM   Modules accepted: Orders

## 2021-02-08 NOTE — Telephone Encounter (Signed)
Received a call from [place] @  [facility] for the foot xray.  Report in chart for review.   Please review and advise.  Thanks. Dm/cma

## 2021-02-14 ENCOUNTER — Ambulatory Visit (INDEPENDENT_AMBULATORY_CARE_PROVIDER_SITE_OTHER): Payer: Medicare Other | Admitting: Physician Assistant

## 2021-02-14 ENCOUNTER — Other Ambulatory Visit: Payer: Self-pay

## 2021-02-14 ENCOUNTER — Encounter (HOSPITAL_COMMUNITY): Payer: Self-pay | Admitting: Vascular Surgery

## 2021-02-14 DIAGNOSIS — M869 Osteomyelitis, unspecified: Secondary | ICD-10-CM

## 2021-02-14 MED ORDER — DOXYCYCLINE HYCLATE 100 MG PO TABS: 100.0000 mg | ORAL_TABLET | Freq: Two times a day (BID) | ORAL | 0 refills | Status: DC

## 2021-02-14 NOTE — Progress Notes (Signed)
Spoke with pt. Given pre-op instructions. During health history and pre-op interview pt would not answer all the questions asked.

## 2021-02-14 NOTE — Progress Notes (Signed)
Office Visit Note   Patient: [name]           Date of Birth: 12/15/1975           MRN: [id number] Visit Date: 02/14/2021              Requested by: Haydee Salter, MD [address] [place],  [place] [address] PCP: Haydee Salter, MD  Chief Complaint  Patient presents with   Left Foot - Pain      HPI: Patient is a pleasant 45 year old gentleman with a history of diabetes and end-stage renal disease.  He reports a several month history of an ulcer that will not heal beneath the medial side of his left great toe.  He does not have any pain but has a history of diabetic neuropathy.  He says this area happened because he was trying to take a callus down himself.  He denies any fever or chills.  He was supposed to get to wound care but they cannot see him until October he says it really has not changed at all in the several months he has had it he has had ABIs recently of the left lower extremity but results are not in his chart.  Assessment & Plan: Visit Diagnoses: No diagnosis found.  Plan: Ulcer base of great toe medially.  While the x-rays suggest a chronic osteo at the base of the medial phalanx it also suggests a gouty picture at the MTP joint.  We will follow him up in 2 weeks and have the results of the ABIs to review.  I will place him in a postop shoe with a doughnut beneath the area to relieve some of the pressure.  Does not have the appearance of acute infection at this time.  Patient knows to call us immediately if anything changes I will review the patient's x-rays with Dr. Sharol Given upon his return.will place him on doxycycline  Follow-Up Instructions: No follow-ups on file.   Ortho Exam  Patient is alert, oriented, no adenopathy, well-dressed, normal affect, normal respiratory effort. Examination of his foot he has a biphasic dorsalis pedis pulse and a biphasic posterior tibial tendon pulse by Doppler.  He has no cellulitis no erythema.  On the medial  plantar surface of his great toe he has a thickened callus with central ulceration.  This measures 1 cm x 1 cm.  Millimeter deep.  Does not probe to bone.  No purulent drainage.  No tunneling.  No evidence of an acute infection.  After verbal consent doubt callus around the ulcer was trimmed to bleeding surfaces which was touched with a silver nitrate stick.  Imaging: No results found. No images are attached to the encounter.  Labs: Lab Results  Component Value Date   HGBA1C 6.5 12/05/2020   HGBA1C 5.8 (H) 05/11/2018   HGBA1C 6.3 01/25/2018   ESRSEDRATE 75 (H) 02/06/2021   CRP 2.8 02/06/2021   REPTSTATUS 12/03/2018 FINAL 12/01/2018   GRAMSTAIN  12/01/2018    RARE WBC PRESENT,BOTH PMN AND MONONUCLEAR NO ORGANISMS SEEN    CULT  12/01/2018    NO GROWTH 2 DAYS Performed at [facility], [place] [address], [place], [place] [address]    LABORGA SERRATIA MARCESCENS 05/08/2011     Lab Results  Component Value Date   ALBUMIN 4.2 12/05/2020   ALBUMIN 2.7 (L) 12/16/2018   ALBUMIN 2.5 (L) 12/14/2018    Lab Results  Component Value Date   MG 2.8 (  H) 12/13/2018   MG 3.1 (H) 12/09/2018   MG 2.7 (H) 12/08/2018   Lab Results  Component Value Date   VD25OH 21.0 05/26/2018   VD25OH 12.7 (L) 05/11/2018    No results found for: PREALBUMIN CBC EXTENDED Latest Ref Rng & Units 02/06/2021 12/05/2020 12/16/2018  WBC 4.0 - 10.5 K/uL 6.7 5.5 6.5  RBC 4.22 - 5.81 Mil/uL 4.07(L) 4.78 2.71(L)  HGB 13.0 - 17.0 g/dL 13.1 15.2 7.4(L)  HCT 39.0 - 52.0 % 39.7 45.6 23.7(L)  PLT 150.0 - 400.0 K/uL 192.0 207.0 256  NEUTROABS 1.4 - 7.7 K/uL 4.1 - -  LYMPHSABS 0.7 - 4.0 K/uL 1.5 - -     There is no height or weight on file to calculate BMI.  Orders:  No orders of the defined types were placed in this encounter.  No orders of the defined types were placed in this encounter.    Procedures: No procedures performed  Clinical Data: No additional findings.  ROS:  All other systems negative,  except as noted in the HPI. Review of Systems  Objective: Vital Signs: There were no vitals taken for this visit.  Specialty Comments:  No specialty comments available.  PMFS History: Patient Active Problem List   Diagnosis Date Noted   Osteomyelitis of great toe of left foot ([place] [facility]) 02/08/2021   Peripheral neuropathy 02/06/2021   Secondary hyperparathyroidism of renal origin ([place]) 12/07/2020   Hyperlipidemia 12/06/2020   Hemodialysis-associated hypotension 12/05/2020   CHF (congestive heart failure) ([place])    Tobacco use    Marijuana use, continuous    Debility 12/13/2018   Medically noncompliant    Acute encephalopathy    Genital warts 11/03/2018   Meralgia paresthetica of right side 08/13/2018   ESRD on dialysis ([place] [facility]) 06/04/2018   Type 2 diabetes mellitus with other diabetic kidney complication ([facility]) 46/27/0350   Paroxysmal atrial fibrillation ([facility]) 05/24/2018   Essential hypertension 01/25/2017   Morbid obesity ([place] [facility]) 01/17/2016   Delayed graft function of kidney transplant due to reason other than ATN or rejection requiring acute dialysis ([facility]) 11/02/2012   S/P kidney transplant 11/02/2012   OSA (obstructive sleep apnea) 05/26/2011   Past Medical History:  Diagnosis Date   Acute respiratory failure with hypoxia ([place]) 11/30/2018   Anemia    ESRD   CHF (congestive heart failure) ([facility])    Diabetes mellitus without complication ([facility])    ESRD on hemodialysis ([place]) 06/07/2011   TTS [facility]. Started HD in 2006, got transplant in 2014 lasted until Dec 2019 then went back on HD.  Has L thigh AVG as of Jun 2020.  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    GERD (gastroesophageal reflux disease)    Hypertension 111/29/2012   pt states he takes no HTN meds   Hypothyroidism, secondary    Morbid obesity ([facility])    Paroxysmal atrial fibrillation ([facility])    Sepsis ([facility] [facility]) 11/30/2018   Sleep apnea 06/07/2011   Pt had sllep study done 2 weeks ago- Dr. Joellyn Quails.  Does not  have  a CPAP at this time.    Family History  Problem Relation Age of Onset   Diabetes Father    Stroke Father    Stroke Maternal Grandmother    Anesthesia problems Neg Hx     Past Surgical History:  Procedure Laterality Date   ARTERIOVENOUS GRAFT PLACEMENT  08/09/2010   Left Thigh Graft by Dr. [name]   AV FISTULA PLACEMENT Right 05/18/2018   Procedure: INSERTION  OF ARTERIOVENOUS (AV) GORE-TEX GRAFT Left THIGH;  Surgeon: Waynetta Sandy, MD;  Location: [facility];  Service: Vascular;  Laterality: Right;   CAPD REMOVAL  05/08/2011   Procedure: CONTINUOUS AMBULATORY PERITONEAL DIALYSIS  (CAPD) CATHETER REMOVAL;  Surgeon: [name], MD;  Location: [facility];  Service: General;  Laterality: N/A;  Removal of CAPD catheter, Dr. requests to go after [place]  10/05/2010   Right Femoral Cath insertion by Dr. [name].  Pt ahas had several caths inserted.   IR FLUORO GUIDE CV LINE RIGHT  05/11/2018   IR US GUIDE VASC ACCESS RIGHT  05/11/2018   KIDNEY TRANSPLANT  2014   KNEE ARTHROSCOPY Left    UPPER EXTREMITY ANGIOGRAPHY Bilateral 05/13/2018   Procedure: UPPER EXTREMITY ANGIOGRAPHY - bilarteral;  Surgeon: Marty Heck, MD;  Location: [facility];  Service: Cardiovascular;  Laterality: Bilateral;   Social History   Occupational History   Not on file  Tobacco Use   Smoking status: Some Days    Types: Cigarettes   Smokeless tobacco: Never   Tobacco comments:    Smokes a cigarette about every 3-4 days.  Vaping Use   Vaping Use: Never used  Substance and Sexual Activity   Alcohol use: Yes    Comment: Socially   Drug use: Yes    Frequency: 10.0 times per week    Types: Marijuana    Comment: 1-2 joints per day   Sexual activity: Yes

## 2021-02-15 ENCOUNTER — Encounter (HOSPITAL_COMMUNITY)
Admission: RE | Disposition: A | Discharge status: Home / Self Care | Payer: Self-pay | Source: Home / Self Care | Attending: Vascular Surgery

## 2021-02-15 ENCOUNTER — Observation Stay (HOSPITAL_COMMUNITY): Payer: Medicare Other

## 2021-02-15 ENCOUNTER — Encounter (HOSPITAL_COMMUNITY): Payer: Self-pay | Admitting: Vascular Surgery

## 2021-02-15 ENCOUNTER — Ambulatory Visit (HOSPITAL_COMMUNITY): Payer: Medicare Other

## 2021-02-15 ENCOUNTER — Ambulatory Visit (HOSPITAL_COMMUNITY): Payer: Medicare Other | Admitting: Anesthesiology

## 2021-02-15 ENCOUNTER — Other Ambulatory Visit: Payer: Self-pay

## 2021-02-15 ENCOUNTER — Observation Stay (HOSPITAL_COMMUNITY)
Admission: RE | Admit: 2021-02-15 | Discharge: 2021-02-16 | Disposition: A | Discharge status: Home / Self Care | Payer: Medicare Other | Attending: Vascular Surgery | Admitting: Vascular Surgery

## 2021-02-15 DIAGNOSIS — I132 Hypertensive heart and chronic kidney disease with heart failure and with stage 5 chronic kidney disease, or end stage renal disease: Secondary | ICD-10-CM | POA: Insufficient documentation

## 2021-02-15 DIAGNOSIS — E1122 Type 2 diabetes mellitus with diabetic chronic kidney disease: Secondary | ICD-10-CM | POA: Diagnosis not present

## 2021-02-15 DIAGNOSIS — F17210 Nicotine dependence, cigarettes, uncomplicated: Secondary | ICD-10-CM | POA: Diagnosis not present

## 2021-02-15 DIAGNOSIS — T82868A Thrombosis of vascular prosthetic devices, implants and grafts, initial encounter: Principal | ICD-10-CM | POA: Insufficient documentation

## 2021-02-15 DIAGNOSIS — I480 Paroxysmal atrial fibrillation: Secondary | ICD-10-CM | POA: Diagnosis not present

## 2021-02-15 DIAGNOSIS — Z940 Kidney transplant status: Secondary | ICD-10-CM | POA: Diagnosis not present

## 2021-02-15 DIAGNOSIS — Z992 Dependence on renal dialysis: Secondary | ICD-10-CM | POA: Insufficient documentation

## 2021-02-15 DIAGNOSIS — Z79899 Other long term (current) drug therapy: Secondary | ICD-10-CM | POA: Diagnosis not present

## 2021-02-15 DIAGNOSIS — I509 Heart failure, unspecified: Secondary | ICD-10-CM | POA: Insufficient documentation

## 2021-02-15 DIAGNOSIS — Y841 Kidney dialysis as the cause of abnormal reaction of the patient, or of later complication, without mention of misadventure at the time of the procedure: Secondary | ICD-10-CM | POA: Diagnosis not present

## 2021-02-15 DIAGNOSIS — Z95828 Presence of other vascular implants and grafts: Secondary | ICD-10-CM

## 2021-02-15 DIAGNOSIS — N186 End stage renal disease: Secondary | ICD-10-CM

## 2021-02-15 DIAGNOSIS — Z419 Encounter for procedure for purposes other than remedying health state, unspecified: Secondary | ICD-10-CM

## 2021-02-15 DIAGNOSIS — E039 Hypothyroidism, unspecified: Secondary | ICD-10-CM | POA: Insufficient documentation

## 2021-02-15 HISTORY — PX: INSERTION OF DIALYSIS CATHETER: SHX1324

## 2021-02-15 HISTORY — PX: AV FISTULA PLACEMENT: SHX1204

## 2021-02-15 LAB — GLUCOSE, CAPILLARY: Glucose-Capillary: 123 mg/dL — ABNORMAL HIGH (ref 70–99)

## 2021-02-15 LAB — POCT I-STAT, CHEM 8
BUN: 69 mg/dL — ABNORMAL HIGH (ref 6–20)
Calcium, Ion: 0.99 mmol/L — ABNORMAL LOW (ref 1.15–1.40)
Chloride: 104 mmol/L (ref 98–111)
Creatinine, Ser: >18 mg/dL — ABNORMAL HIGH (ref 0.61–1.24)
Glucose, Bld: 114 mg/dL — ABNORMAL HIGH (ref 70–99)
HCT: 36 % — ABNORMAL LOW (ref 39.0–52.0)
Hemoglobin: 12.2 g/dL — ABNORMAL LOW (ref 13.0–17.0)
Potassium: 5.2 mmol/L — ABNORMAL HIGH (ref 3.5–5.1)
Sodium: 136 mmol/L (ref 135–145)
TCO2: 24 mmol/L (ref 22–32)

## 2021-02-15 SURGERY — INSERTION OF DIALYSIS CATHETER
Anesthesia: General | Laterality: Right

## 2021-02-15 MED ORDER — ONDANSETRON HCL 4 MG/2ML IJ SOLN
4.0000 mg | Freq: Once | INTRAMUSCULAR | Status: DC | PRN
Start: 2021-02-15 — End: 2021-02-15

## 2021-02-15 MED ORDER — CEFAZOLIN IN SODIUM CHLORIDE 3-0.9 GM/100ML-% IV SOLN
3.0000 g | INTRAVENOUS | Status: AC
  Administered 2021-02-15: 3 g via INTRAVENOUS
  Filled 2021-02-15: qty 100

## 2021-02-15 MED ORDER — CHLORHEXIDINE GLUCONATE 0.12 % MT SOLN: 15.0000 mL | Freq: Once | OROMUCOSAL | Status: AC

## 2021-02-15 MED ORDER — METOPROLOL TARTRATE 5 MG/5ML IV SOLN: 2.0000 mg | INTRAVENOUS | Status: DC | PRN

## 2021-02-15 MED ORDER — LIDOCAINE 2% (20 MG/ML) 5 ML SYRINGE
INTRAMUSCULAR | Status: DC | PRN
  Administered 2021-02-15: 60 mg via INTRAVENOUS

## 2021-02-15 MED ORDER — PHENYLEPHRINE HCL-NACL 20-0.9 MG/250ML-% IV SOLN
INTRAVENOUS | Status: DC | PRN
  Administered 2021-02-15: 25 ug/min via INTRAVENOUS

## 2021-02-15 MED ORDER — MIDAZOLAM HCL 2 MG/2ML IJ SOLN
INTRAMUSCULAR | Status: AC
  Filled 2021-02-15: qty 2

## 2021-02-15 MED ORDER — CHLORHEXIDINE GLUCONATE 4 % EX LIQD: 60.0000 mL | Freq: Once | CUTANEOUS | Status: DC

## 2021-02-15 MED ORDER — OXYCODONE HCL 5 MG PO TABS
5.0000 mg | ORAL_TABLET | Freq: Once | ORAL | Status: AC | PRN
  Administered 2021-02-15: 5 mg via ORAL

## 2021-02-15 MED ORDER — HYDRALAZINE HCL 20 MG/ML IJ SOLN
5.0000 mg | INTRAMUSCULAR | Status: DC | PRN
Start: 2021-02-15 — End: 2021-02-16

## 2021-02-15 MED ORDER — IODIXANOL 320 MG/ML IV SOLN
INTRAVENOUS | Status: DC | PRN
  Administered 2021-02-15: 47 mL via INTRAVENOUS

## 2021-02-15 MED ORDER — CHLORHEXIDINE GLUCONATE CLOTH 2 % EX PADS
6.0000 | MEDICATED_PAD | Freq: Every day | CUTANEOUS | Status: DC
  Administered 2021-02-16 (×2): 6 via TOPICAL

## 2021-02-15 MED ORDER — FENTANYL CITRATE (PF) 250 MCG/5ML IJ SOLN
INTRAMUSCULAR | Status: AC
  Filled 2021-02-15: qty 5

## 2021-02-15 MED ORDER — ACETAMINOPHEN 500 MG PO TABS: 1000.0000 mg | ORAL_TABLET | Freq: Once | ORAL | Status: DC

## 2021-02-15 MED ORDER — HEPARIN SODIUM (PORCINE) 1000 UNIT/ML IJ SOLN
INTRAMUSCULAR | Status: AC
  Filled 2021-02-15: qty 1

## 2021-02-15 MED ORDER — HEPARIN 6000 UNIT IRRIGATION SOLUTION
Status: AC
  Filled 2021-02-15: qty 500

## 2021-02-15 MED ORDER — ACETAMINOPHEN 325 MG PO TABS: 325.0000 mg | ORAL_TABLET | ORAL | Status: DC | PRN

## 2021-02-15 MED ORDER — ACETAMINOPHEN 650 MG RE SUPP: 325.0000 mg | RECTAL | Status: DC | PRN

## 2021-02-15 MED ORDER — HEPARIN SODIUM (PORCINE) 1000 UNIT/ML IJ SOLN
INTRAMUSCULAR | Status: DC | PRN
  Administered 2021-02-15: 4200 [IU]

## 2021-02-15 MED ORDER — PHENYLEPHRINE HCL (PRESSORS) 10 MG/ML IV SOLN
INTRAVENOUS | Status: DC | PRN
Start: 2021-02-15 — End: 2021-02-15
  Administered 2021-02-15: 120 ug via INTRAVENOUS
  Administered 2021-02-15: 80 ug via INTRAVENOUS

## 2021-02-15 MED ORDER — PROTAMINE SULFATE 10 MG/ML IV SOLN
INTRAVENOUS | Status: DC | PRN
  Administered 2021-02-15: 25 mg via INTRAVENOUS

## 2021-02-15 MED ORDER — CHLORHEXIDINE GLUCONATE 0.12 % MT SOLN
OROMUCOSAL | Status: AC
  Administered 2021-02-15: 15 mL via OROMUCOSAL
  Filled 2021-02-15: qty 15

## 2021-02-15 MED ORDER — LABETALOL HCL 5 MG/ML IV SOLN: 10.0000 mg | INTRAVENOUS | Status: DC | PRN

## 2021-02-15 MED ORDER — ORAL CARE MOUTH RINSE: 15.0000 mL | Freq: Once | OROMUCOSAL | Status: AC

## 2021-02-15 MED ORDER — LIDOCAINE-EPINEPHRINE (PF) 1 %-1:200000 IJ SOLN
INTRAMUSCULAR | Status: AC
  Filled 2021-02-15: qty 30

## 2021-02-15 MED ORDER — ALBUMIN HUMAN 5 % IV SOLN: INTRAVENOUS | Status: DC | PRN

## 2021-02-15 MED ORDER — OXYCODONE HCL 5 MG/5ML PO SOLN: 5.0000 mg | Freq: Once | ORAL | Status: AC | PRN

## 2021-02-15 MED ORDER — GUAIFENESIN-DM 100-10 MG/5ML PO SYRP
15.0000 mL | ORAL_SOLUTION | ORAL | Status: DC | PRN
  Filled 2021-02-15: qty 15

## 2021-02-15 MED ORDER — HYDROMORPHONE HCL 1 MG/ML IJ SOLN
0.5000 mg | INTRAMUSCULAR | Status: DC | PRN
  Administered 2021-02-15: 1 mg via INTRAVENOUS
  Filled 2021-02-15: qty 1

## 2021-02-15 MED ORDER — HEMOSTATIC AGENTS (NO CHARGE) OPTIME
TOPICAL | Status: DC | PRN
  Administered 2021-02-15: 1 via TOPICAL

## 2021-02-15 MED ORDER — OXYCODONE-ACETAMINOPHEN 5-325 MG PO TABS
1.0000 | ORAL_TABLET | ORAL | Status: DC | PRN
  Administered 2021-02-15: 1 via ORAL
  Filled 2021-02-15: qty 1

## 2021-02-15 MED ORDER — FERRIC CITRATE 1 GM 210 MG(FE) PO TABS
630.0000 mg | ORAL_TABLET | Freq: Three times a day (TID) | ORAL | Status: DC
  Administered 2021-02-16: 630 mg via ORAL
  Filled 2021-02-15 (×2): qty 3

## 2021-02-15 MED ORDER — PANTOPRAZOLE SODIUM 40 MG PO TBEC
40.0000 mg | DELAYED_RELEASE_TABLET | Freq: Every day | ORAL | Status: DC
  Administered 2021-02-16: 40 mg via ORAL
  Filled 2021-02-15 (×2): qty 1

## 2021-02-15 MED ORDER — HEPARIN SODIUM (PORCINE) 1000 UNIT/ML IJ SOLN
INTRAMUSCULAR | Status: DC | PRN
  Administered 2021-02-15: 5000 [IU] via INTRAVENOUS

## 2021-02-15 MED ORDER — FENTANYL CITRATE (PF) 250 MCG/5ML IJ SOLN
INTRAMUSCULAR | Status: DC | PRN
  Administered 2021-02-15 (×8): 25 ug via INTRAVENOUS
  Administered 2021-02-15: 50 ug via INTRAVENOUS
  Administered 2021-02-15 (×4): 25 ug via INTRAVENOUS

## 2021-02-15 MED ORDER — FENTANYL CITRATE (PF) 100 MCG/2ML IJ SOLN
25.0000 ug | INTRAMUSCULAR | Status: DC | PRN
  Administered 2021-02-15 (×2): 50 ug via INTRAVENOUS

## 2021-02-15 MED ORDER — MIDAZOLAM HCL 2 MG/2ML IJ SOLN
INTRAMUSCULAR | Status: DC | PRN
Start: 2021-02-15 — End: 2021-02-15
  Administered 2021-02-15: 1 mg via INTRAVENOUS

## 2021-02-15 MED ORDER — OXYCODONE HCL 5 MG PO TABS
ORAL_TABLET | ORAL | Status: AC
  Filled 2021-02-15: qty 1

## 2021-02-15 MED ORDER — 0.9 % SODIUM CHLORIDE (POUR BTL) OPTIME
TOPICAL | Status: DC | PRN
  Administered 2021-02-15: 1000 mL

## 2021-02-15 MED ORDER — DEXAMETHASONE SODIUM PHOSPHATE 10 MG/ML IJ SOLN
INTRAMUSCULAR | Status: DC | PRN
Start: 2021-02-15 — End: 2021-02-15
  Administered 2021-02-15: 10 mg via INTRAVENOUS

## 2021-02-15 MED ORDER — PROPOFOL 10 MG/ML IV BOLUS
INTRAVENOUS | Status: AC
  Filled 2021-02-15: qty 40

## 2021-02-15 MED ORDER — PHENOL 1.4 % MT LIQD: 1.0000 | OROMUCOSAL | Status: DC | PRN

## 2021-02-15 MED ORDER — EPHEDRINE SULFATE 50 MG/ML IJ SOLN
INTRAMUSCULAR | Status: DC | PRN
  Administered 2021-02-15 (×2): 5 mg via INTRAVENOUS

## 2021-02-15 MED ORDER — HEPARIN 6000 UNIT IRRIGATION SOLUTION
Status: DC | PRN
  Administered 2021-02-15: 1

## 2021-02-15 MED ORDER — SODIUM CHLORIDE 0.9 % IV SOLN: INTRAVENOUS | Status: DC

## 2021-02-15 MED ORDER — DOXYCYCLINE HYCLATE 100 MG PO TABS
100.0000 mg | ORAL_TABLET | Freq: Two times a day (BID) | ORAL | Status: DC
  Administered 2021-02-15 – 2021-02-16 (×2): 100 mg via ORAL
  Filled 2021-02-15 (×2): qty 1

## 2021-02-15 MED ORDER — ATORVASTATIN CALCIUM 40 MG PO TABS
40.0000 mg | ORAL_TABLET | Freq: Every day | ORAL | Status: DC
  Administered 2021-02-15 – 2021-02-16 (×2): 40 mg via ORAL
  Filled 2021-02-15 (×2): qty 1

## 2021-02-15 MED ORDER — PROPOFOL 10 MG/ML IV BOLUS
INTRAVENOUS | Status: DC | PRN
  Administered 2021-02-15: 250 mg via INTRAVENOUS

## 2021-02-15 MED ORDER — ONDANSETRON HCL 4 MG/2ML IJ SOLN: 4.0000 mg | Freq: Four times a day (QID) | INTRAMUSCULAR | Status: DC | PRN

## 2021-02-15 MED ORDER — FENTANYL CITRATE (PF) 100 MCG/2ML IJ SOLN
INTRAMUSCULAR | Status: AC
  Filled 2021-02-15: qty 2

## 2021-02-15 SURGICAL SUPPLY — 72 items
ADH SKN CLS APL DERMABOND .7 (GAUZE/BANDAGES/DRESSINGS) ×3
BAG COUNTER SPONGE SURGICOUNT (BAG) ×1 IMPLANT
BAG DECANTER FOR FLEXI CONT (MISCELLANEOUS) ×1 IMPLANT
BAG SPNG CNTER NS LX DISP (BAG)
BIOPATCH RED 1 DISK 7.0 (GAUZE/BANDAGES/DRESSINGS) ×2 IMPLANT
CANISTER SUCT 3000ML PPV (MISCELLANEOUS) ×2 IMPLANT
CATH BEACON 5 .035 40 KMP TP (CATHETERS) IMPLANT
CATH BEACON 5 .038 40 KMP TP (CATHETERS) ×2
CATH PALINDROME-P 19CM W/VT (CATHETERS) ×1 IMPLANT
CATH PALINDROME-P 23CM W/VT (CATHETERS) IMPLANT
CATH PALINDROME-P 28CM W/VT (CATHETERS) ×1 IMPLANT
CLIP LIGATING EXTRA MED SLVR (CLIP) ×2 IMPLANT
CLIP LIGATING EXTRA SM BLUE (MISCELLANEOUS) ×2 IMPLANT
CLIP VESOCCLUDE MED 6/CT (CLIP) ×2 IMPLANT
CLIP VESOCCLUDE SM WIDE 6/CT (CLIP) ×2 IMPLANT
COVER PROBE W GEL 5X96 (DRAPES) ×3 IMPLANT
COVER SURGICAL LIGHT HANDLE (MISCELLANEOUS) ×1 IMPLANT
DERMABOND ADVANCED (GAUZE/BANDAGES/DRESSINGS) ×3
DERMABOND ADVANCED .7 DNX12 (GAUZE/BANDAGES/DRESSINGS) ×1 IMPLANT
DRAPE C-ARM 42X72 X-RAY (DRAPES) ×2 IMPLANT
DRAPE CHEST BREAST 15X10 FENES (DRAPES) ×2 IMPLANT
DRAPE INCISE IOBAN 66X45 STRL (DRAPES) ×2 IMPLANT
DRAPE ORTHO SPLIT 77X108 STRL (DRAPES) ×2
DRAPE SURG ORHT 6 SPLT 77X108 (DRAPES) IMPLANT
DRSG COVADERM 4X6 (GAUZE/BANDAGES/DRESSINGS) ×1 IMPLANT
ELECT REM PT RETURN 9FT ADLT (ELECTROSURGICAL) ×2
ELECTRODE REM PT RTRN 9FT ADLT (ELECTROSURGICAL) ×1 IMPLANT
GAUZE 4X4 16PLY ~~LOC~~+RFID DBL (SPONGE) ×2 IMPLANT
GLOVE SURG ENC MOIS LTX SZ7.5 (GLOVE) ×3 IMPLANT
GLOVE SURG POLYISO LF SZ6.5 (GLOVE) ×3 IMPLANT
GLOVE SURG POLYISO LF SZ7 (GLOVE) ×1 IMPLANT
GOWN STRL REUS W/ TWL LRG LVL3 (GOWN DISPOSABLE) ×2 IMPLANT
GOWN STRL REUS W/ TWL XL LVL3 (GOWN DISPOSABLE) ×1 IMPLANT
GOWN STRL REUS W/TWL LRG LVL3 (GOWN DISPOSABLE) ×6
GOWN STRL REUS W/TWL XL LVL3 (GOWN DISPOSABLE) ×4
GRAFT GORETEX STRT 4-7X45 (Vascular Products) ×1 IMPLANT
GUIDEWIRE ANGLED .035X150CM (WIRE) ×1 IMPLANT
HEMOSTAT SNOW SURGICEL 2X4 (HEMOSTASIS) IMPLANT
INSERT FOGARTY SM (MISCELLANEOUS) ×2 IMPLANT
KIT BASIN OR (CUSTOM PROCEDURE TRAY) ×2 IMPLANT
KIT PALINDROME-P 55CM (CATHETERS) IMPLANT
KIT TURNOVER KIT B (KITS) ×2 IMPLANT
NDL 18GX1X1/2 (RX/OR ONLY) (NEEDLE) ×1 IMPLANT
NDL HYPO 25GX1X1/2 BEV (NEEDLE) ×1 IMPLANT
NEEDLE 18GX1X1/2 (RX/OR ONLY) (NEEDLE) ×4 IMPLANT
NEEDLE HYPO 25GX1X1/2 BEV (NEEDLE) IMPLANT
NS IRRIG 1000ML POUR BTL (IV SOLUTION) ×2 IMPLANT
PACK CV ACCESS (CUSTOM PROCEDURE TRAY) ×2 IMPLANT
PACK SURGICAL SETUP 50X90 (CUSTOM PROCEDURE TRAY) ×1 IMPLANT
PAD ARMBOARD 7.5X6 YLW CONV (MISCELLANEOUS) ×4 IMPLANT
POWDER SURGICEL 3.0 GRAM (HEMOSTASIS) ×1 IMPLANT
SET MICROPUNCTURE 5F STIFF (MISCELLANEOUS) ×3 IMPLANT
SHEATH BRITE TIP 8FR 23CM (SHEATH) ×1 IMPLANT
SHEATH PINNACLE 5F 10CM (SHEATH) ×1 IMPLANT
SOAP 2 % CHG 4 OZ (WOUND CARE) ×2 IMPLANT
SPONGE T-LAP 18X18 ~~LOC~~+RFID (SPONGE) ×1 IMPLANT
SUT ETHILON 3 0 PS 1 (SUTURE) ×2 IMPLANT
SUT MNCRL AB 4-0 PS2 18 (SUTURE) ×4 IMPLANT
SUT PROLENE 5 0 C 1 24 (SUTURE) ×2 IMPLANT
SUT PROLENE 6 0 BV (SUTURE) ×4 IMPLANT
SUT VIC AB 3-0 SH 27 (SUTURE) ×4
SUT VIC AB 3-0 SH 27X BRD (SUTURE) ×2 IMPLANT
SYR 10ML LL (SYRINGE) ×1 IMPLANT
SYR 20ML LL LF (SYRINGE) ×3 IMPLANT
SYR 5ML LL (SYRINGE) ×1 IMPLANT
SYR CONTROL 10ML LL (SYRINGE) ×2 IMPLANT
TOWEL GREEN STERILE (TOWEL DISPOSABLE) ×2 IMPLANT
TOWEL GREEN STERILE FF (TOWEL DISPOSABLE) ×4 IMPLANT
UNDERPAD 30X36 HEAVY ABSORB (UNDERPADS AND DIAPERS) ×1 IMPLANT
WATER STERILE IRR 1000ML POUR (IV SOLUTION) ×2 IMPLANT
WIRE AMPLATZ SS-J .035X180CM (WIRE) ×3 IMPLANT
WIRE TORQFLEX AUST .018X40CM (WIRE) ×2 IMPLANT

## 2021-02-15 NOTE — Transfer of Care (Signed)
Immediate Anesthesia Transfer of Care Note  Patient: [name]  Procedure(s) Performed: Attempted INSERTION OF RIGHT and Left Internal Jugular DIALYSIS CATHETER, Insertion of Left Femoral Vein Dialysis Catheter (Right) INSERTION OF ARTERIOVENOUS (AV) GORE-TEX LOOP GRAFT RIGHT THIGH (Right)  Patient Location: PACU  Anesthesia Type:General  Level of Consciousness: awake, alert  and oriented  Airway & Oxygen Therapy: Patient Spontanous Breathing  Post-op Assessment: Report given to RN and Post -op Vital signs reviewed and stable  Post vital signs: Reviewed and stable  Last Vitals:  Vitals Value Taken Time  BP 133/85 02/15/21 1158  Temp    Pulse 97 02/15/21 1201  Resp 18 02/15/21 1201  SpO2 94 % 02/15/21 1201  Vitals shown include unvalidated device data.  Last Pain:  Vitals:   02/15/21 0602  TempSrc: Oral  PainSc: 0-No pain      Patients Stated Pain Goal: 2 (95/32/02 3343)  Complications: No notable events documented.

## 2021-02-15 NOTE — Interval H&P Note (Signed)
History and Physical Interval Note:  02/15/2021 7:16 AM  [name]  has presented today for surgery, with the diagnosis of ESRD.  The various methods of treatment have been discussed with the patient and family. After consideration of risks, benefits and other options for treatment, the patient has consented to  Procedure(s): INSERTION OF RIGHT TUNNELLED DIALYSIS CATHETER (Right) INSERTION OF ARTERIOVENOUS (AV) GORE-TEX LOOP GRAFT RIGHT THIGH (Right) as a surgical intervention.  The patient's history has been reviewed, patient examined, no change in status, stable for surgery.  I have reviewed the patient's chart and labs.  Questions were answered to the patient's satisfaction.     Servando Snare

## 2021-02-15 NOTE — Anesthesia Procedure Notes (Signed)
Procedure Name: Intubation Date/Time: 02/15/2021 7:37 AM Performed by: Clearnce Sorrel, CRNA Pre-anesthesia Checklist: Patient identified, Emergency Drugs available, Suction available and Patient being monitored Patient Re-evaluated:Patient Re-evaluated prior to induction Oxygen Delivery Method: Circle System Utilized Preoxygenation: Pre-oxygenation with 100% oxygen Induction Type: IV induction Ventilation: Mask ventilation without difficulty LMA Size: 5.0 Tube type: Oral Number of attempts: 1 Airway Equipment and Method: Stylet and Oral airway Placement Confirmation: ETT inserted through vocal cords under direct vision, positive ETCO2 and breath sounds checked- equal and bilateral Tube secured with: Tape Dental Injury: Teeth and Oropharynx as per pre-operative assessment

## 2021-02-15 NOTE — Op Note (Addendum)
Patient name: [name] MRN: [id number] DOB: Sep 21, 1975 Sex: male  02/15/2021 Pre-operative Diagnosis: End-stage renal disease Post-operative diagnosis:  Same Surgeon:  Erlene Quan C. Donzetta Matters, MD Assistant: Risa Grill, PA Procedure Performed: 1.  Ultrasound-guided cannulation bilateral internal jugular veins with venography 2.  Placement of left femoral 28 cm tunneled dialysis catheter with ultrasound and fluoroscopic guidance 3.  Left iliofemoral venogram 4.  Right femoral arteriovenous loop graft  Indications: 45 year old male with history of end-stage renal disease.  He has exhausted bilateral upper extremity access procedures.  He has a right femoral TDC which he is currently dialyzing via.  He has previous thigh graft on the left which was thrombosed.  He is indicated for placement of tunneled dialysis catheter and right femoral AV graft placement.  An assistant was necessary to facilitate exposure of the femoral vessels and expedite the case.   Findings: I was able to cannulate the bilateral internal jugular veins perform venogram there was no connection to the central system.  On the left groin I was able to cannulate the femoral vein and get into a collateral into the IVC and confirmed intraluminal access.  I then placed a 20 cm catheter that initiated in the left femoral vein terminating in the left common iliac vein.  At completion of placement of the catheter I performed venography through both ports and there was good patency and I did flush and withdraw easily and was locked with concentrated heparin in both ports.  I then placed a right femoral loop graft.  The common femoral artery was healthy.  We ended up placing the graft onto the access site for the existing tunneled dialysis catheter.  At completion there was a very strong thrill in the graft.   Procedure:  The patient was identified in the holding area and taken to the operating was placed supine on upper table and LMA  anesthesia was induced.  He was sterilely prepped and draped in the bilateral neck and chest in usual fashion, antibiotics were minister timeout was called.  I use ultrasound I initially identified a right internal jugular vein which was large was minimally compressible.  This was cannulated initially with 18-gauge needle but a wire would not pass centrally.  I then cannulated with micropuncture needle could not pass a small wire centrally but was able to get a sheath in place and performed venography this demonstrated no communication to the central system.  I then identified the left side was identified in the internal jugular vein which were again was large but not compressible.  This was cannulated with a micropuncture needle and I could not pass a wire centrally but again got a sheath in place perform venogram there was no communication to the central system I elected to abort.  We then prepped out the right groin for catheter placement.  I use ultrasound identified common femoral vein and the femoral vein.  I was able to cannulate initially the common femoral vein perform venogram on the left which demonstrated filling via collaterals.  I then cannulated distally into the femoral vein with a micropuncture needle followed by wire and sheath.  I was able to get a Glidewire to traverse up into the collateral I placed a 5 [place] sheath initially.  I was able to use a bare catheter performed central venogram and then placed an Amplatz wire.  I placed a long 8 [place] sheath.  I then got 2 wires in place attempted to place a 23 cm  tunneled dialysis catheter via tunneled incision.  Unfortunately could not get at the tunnel.  I then backed out and placed the introducer sheath over one of the wires.  Through the introducer sheath I placed a long 8 French sheath was able to get a second Amplatz wire up into the IVC fluoroscopically.  I then placed a 28 cm catheter through the introducer sheath over both wires was able to  get this in the left common iliac vein.  I performed venography through both ports which demonstrated patency.  I did flush and withdraw easily.  It was locked with concentrated heparin to the manufacturer's recommendation.  Was affixed to the skin with 3-0 nylon suture.  Attention was then turned to the right groin.  This was sterilely prepped and draped in the usual fashion albeit with a catheter in place we prepped the ports of the catheter out.  Transverse incision was made we dissected down we identified the tunneled dialysis catheter this was transected and clamped.  We dissected down to the common femoral artery this was free of disease a vessel loop was placed around this.  We then dissected down to the area where the [facility] appeared to be entering the femoral vein.  We were able to get a side-biting clamp on the femoral vein.  5000 units of heparin was administered.  We then placed a side-biting clamp to remove the tunneled dialysis catheter.  4-7 mm graft was then tunneled via a counterincision.  We turned our attention first to the vein.  We beveled the graft and sewed in place end-to-side to the femoral vein with 5-0 Prolene suture.  This was a very difficult anastomosis.  At completion we flushed with heparinized saline and did flush easily.  We then clamped the femoral artery distally and proximally opened on the anterior surface.  We put a slight bevel in the 4 mm end of the graft and sewed end-to-side with 6-0 Prolene suture.  Prior to completion we allowed flushing all directions.  Point lesion there was a very strong thrill in the graft there was good runoff in the femoral vein via Doppler.  There was good signal in the distal common femoral artery distal to the anastomosis.  Satisfied with this we administered 25 mg of protamine.  We obtained hemostasis meticulously in the wounds.  We then closed the counterincision with 4-0 Monocryl in the groin incision with Vicryl and Monocryl.  Dermabond is  placed at both incision sites.  He was awakened from anesthesia having tolerated seizure without any complication.  All counts were correct at completion.  EBL: 200cc   Oree Hislop C. Donzetta Matters, MD [facility] Office: [phone] Pager: [phone]

## 2021-02-15 NOTE — Progress Notes (Signed)
Pt receives out-pt HD at [facility] ([facility] on TTS at 5:30am. Spoke to [place] at clinic who is agreeable to pt coming to clinic in the am for regular HD treatment. Pt will need to arrive at clinic by 12:00. Update provided to Risa Grill, [place].  Melven Sartorius Renal Navigator  [phone]

## 2021-02-15 NOTE — Anesthesia Preprocedure Evaluation (Addendum)
Anesthesia Evaluation  Patient identified by MRN, date of birth, ID band Patient awake    Reviewed: Allergy & Precautions, NPO status , Patient's Chart, lab work & pertinent test results  Airway Mallampati: III  TM Distance: >3 FB Neck ROM: Full   Comment: Thick beard Dental no notable dental hx. (+) Teeth Intact, Dental Advisory Given   Pulmonary sleep apnea (does not use CPAP) , former smoker,    Pulmonary exam normal breath sounds clear to auscultation       Cardiovascular +CHF  Normal cardiovascular exam+ dysrhythmias Atrial Fibrillation  Rhythm:Regular Rate:Normal  Echo 2020 1. The left ventricle has normal systolic function, with an ejection  fraction of 55-60%. The cavity size was normal. There is moderately  increased left ventricular wall thickness. Left ventricular diastolic  Doppler parameters are consistent with  impaired relaxation. No evidence of left ventricular regional wall motion  abnormalities. D-shaped interventricular septum suggestive of RV  pressure/volume overload.  2. Right atrial size was mildly dilated.  3. Trivial pericardial effusion is present.  4. The aortic valve is tricuspid. Mild calcification of the aortic valve.  No stenosis of the aortic valve.  5. The aortic root is normal in size and structure.  6. There is mild dilatation of the ascending aorta measuring 42 mm.  7. The right ventricle has severely reduced systolic function. The cavity  was moderately enlarged. There is no increase in right ventricular wall  thickness.  8. The inferior vena cava was dilated in size with <50% respiratory  variability. PA systolic pressure 45 mmHg.  9. No evidence of mitral valve stenosis. No significant mitral  regurgitation.    Neuro/Psych negative neurological ROS  negative psych ROS   GI/Hepatic negative GI ROS, (+)     substance abuse  marijuana use,   Endo/Other  diabetes, Well  Controlled, Type 2Morbid obesityBMI 40 a1c 6.5  Renal/GU ESRF and DialysisRenal diseaseHD TTS K 5.2 this AM Last HD 6d ago through R fem HD cath  negative genitourinary   Musculoskeletal negative musculoskeletal ROS (+)   Abdominal (+) + obese,   Peds negative pediatric ROS (+)  Hematology  (+) Blood dyscrasia, anemia , hct 36, plt 192   Anesthesia Other Findings   Reproductive/Obstetrics negative OB ROS                            Anesthesia Physical Anesthesia Plan  ASA: 3  Anesthesia Plan: General   Post-op Pain Management:    Induction:   PONV Risk Score and Plan: Ondansetron, Treatment may vary due to age or medical condition, Midazolam and Dexamethasone  Airway Management Planned: LMA  Additional Equipment: None  Intra-op Plan:   Post-operative Plan: Extubation in OR  Informed Consent: I have reviewed the patients History and Physical, chart, labs and discussed the procedure including the risks, benefits and alternatives for the proposed anesthesia with the patient or authorized representative who has indicated his/her understanding and acceptance.     Dental advisory given  Plan Discussed with: CRNA  Anesthesia Plan Comments:        Anesthesia Quick Evaluation

## 2021-02-15 NOTE — Anesthesia Postprocedure Evaluation (Signed)
Anesthesia Post Note  Patient: [name]  Procedure(s) Performed: Attempted INSERTION OF RIGHT and Left Internal Jugular DIALYSIS CATHETER, Insertion of Left Femoral Vein Dialysis Catheter (Right) INSERTION OF ARTERIOVENOUS (AV) GORE-TEX LOOP GRAFT RIGHT THIGH (Right)     Patient location during evaluation: PACU Anesthesia Type: General Level of consciousness: awake and alert, oriented and patient cooperative Pain management: pain level controlled Vital Signs Assessment: post-procedure vital signs reviewed and stable Respiratory status: spontaneous breathing, nonlabored ventilation and respiratory function stable Cardiovascular status: blood pressure returned to baseline and stable Postop Assessment: no apparent nausea or vomiting Anesthetic complications: no   No notable events documented.  Last Vitals:  Vitals:   02/15/21 1215 02/15/21 1300  BP: (!) 135/96 (!) 132/97  Pulse: 93 90  Resp: 15 19  Temp:  36.4 C  SpO2: 93% 100%    Last Pain:  Vitals:   02/15/21 1300  TempSrc:   PainSc: [place]

## 2021-02-15 NOTE — Progress Notes (Addendum)
S/p attempt at right IJ access, s/p left femoral TDC placement and placement of right thigh AVG. Admit for overnight observation. Nephrology notified. Renal navigators Marya Amsler [place], Terri Piedra and Melven Sartorius) contacted regarding chair time tomorrow with anticipated DC tomorrow morning. Dr. Jonnie Finner aware. (Records indicate TTS schedule at [facility] location.)  [employer] [employer] can accept patient for dialysis treatment tomorrow, 9/10 and request patient arrive by 12 noon to complete full treatment.

## 2021-02-16 DIAGNOSIS — T82868A Thrombosis of vascular prosthetic devices, implants and grafts, initial encounter: Secondary | ICD-10-CM | POA: Diagnosis not present

## 2021-02-16 MED ORDER — OXYCODONE-ACETAMINOPHEN 5-325 MG PO TABS: 1.0000 | ORAL_TABLET | Freq: Four times a day (QID) | ORAL | 0 refills | Status: DC | PRN

## 2021-02-16 NOTE — Discharge Instructions (Addendum)
[facility] Vein Specialists of [facility]  Discharge Instructions  AV Fistula or Graft Surgery for Dialysis Access  Please refer to the following instructions for your post-procedure care. Your surgeon or physician assistant will discuss any changes with you.  Activity  You may drive the day following your surgery, if you are comfortable and no longer taking prescription pain medication. Resume full activity as the soreness in your incision resolves.  Bathing/Showering  You may shower after you go home. Keep your incision dry for 48 hours. Do not soak in a bathtub, hot tub, or swim until the incision heals completely. You may not shower if you have a hemodialysis catheter.  Incision Care  Clean your incision with mild soap and water after 48 hours. Pat the area dry with a clean towel. You do not need a bandage unless otherwise instructed. Do not apply any ointments or creams to your incision. You may have skin glue on your incision. Do not peel it off. It will come off on its own in about one week. Your arm may swell a bit after surgery. To reduce swelling use pillows to elevate your arm so it is above your heart. Your doctor will tell you if you need to lightly wrap your arm with an ACE bandage.  Wash the groin wound with soap and water daily and pat dry. (No tub bath-only shower)  Then put a dry gauze or washcloth there to keep this area dry daily and as needed.  Do not use Vaseline or neosporin on your incisions.  Only use soap and water on your incisions and then protect and keep dry.   Diet  Resume your normal diet. There are not special food restrictions following this procedure. In order to heal from your surgery, it is CRITICAL to get adequate nutrition. Your body requires vitamins, minerals, and protein. Vegetables are the best source of vitamins and minerals. Vegetables also provide the perfect balance of protein. Processed food has little nutritional value, so try to avoid  this.  Medications  Resume taking all of your medications. If your incision is causing pain, you may take over-the counter pain relievers such as acetaminophen (Tylenol). If you were prescribed a stronger pain medication, please be aware these medications can cause nausea and constipation. Prevent nausea by taking the medication with a snack or meal. Avoid constipation by drinking plenty of fluids and eating foods with high amount of fiber, such as fruits, vegetables, and grains.  Do not take Tylenol if you are taking prescription pain medications.  Follow up Your surgeon may want to see you in the office following your access surgery. If so, this will be arranged at the time of your surgery.  Please call us immediately for any of the following conditions:  Increased pain, redness, drainage (pus) from your incision site Fever of 101 degrees or higher Severe or worsening pain at your incision site Hand pain or numbness.  Reduce your risk of vascular disease:  Stop smoking. If you would like help, call [facility] at 1-800-QUIT-NOW 636-851-5353) or [place] at [place] your cholesterol Maintain a desired weight Control your diabetes Keep your blood pressure down  Dialysis  It will take several weeks to several months for your new dialysis access to be ready for use. Your surgeon will determine when it is okay to use it. Your nephrologist will continue to direct your dialysis. You can continue to use your Permcath until your new access is ready for use.  02/16/2021 [name] [id number] September 05, 1975  Surgeon(s): Waynetta Sandy, MD  Procedure(s): Attempted INSERTION OF RIGHT and Left Internal Jugular DIALYSIS CATHETER, Insertion of Left Femoral Vein Dialysis Catheter INSERTION OF ARTERIOVENOUS (AV) GORE-TEX LOOP GRAFT RIGHT THIGH  x Do not stick graft for 4 weeks    If you have any questions, please call the office at [phone].

## 2021-02-16 NOTE — Discharge Summary (Signed)
Discharge Summary    [name] [birth year] 45 y.o. male  [id number]  Admission Date: 02/15/2021  Discharge Date: 02/16/2021   Physician: Thomes Lolling*  Admission Diagnosis: ESRD on dialysis [facility]) [N18.6, Z99.2]   HPI:   This is a 45 y.o. male who presents for placement of a permanent hemodialysis access.  He has had multiple UE access's that failed with time, a kidney transplant in the past and 2 left femoral loop grafts.  The last left femoral loop graft became occluded 2 weeks ago.  [facility] vascular attempted salvage and ended up placing a right femoral TDC.   Hospital Course:  The patient was admitted to the hospital and taken to the operating room on 02/15/2021 and underwent: 1.  Ultrasound-guided cannulation bilateral internal jugular veins with venography 2.  Placement of left femoral 28 cm tunneled dialysis catheter with ultrasound and fluoroscopic guidance 3.  Left iliofemoral venogram 4.  Right femoral arteriovenous loop graft    Findings:  I was able to cannulate the bilateral internal jugular veins perform venogram there was no connection to the central system.  On the left groin I was able to cannulate the femoral vein and get into a collateral into the IVC and confirmed intraluminal access.  I then placed a 20 cm catheter that initiated in the left femoral vein terminating in the left common iliac vein.  At completion of placement of the catheter I performed venography through both ports and there was good patency and I did flush and withdraw easily and was locked with concentrated heparin in both ports.  I then placed a right femoral loop graft.  The common femoral artery was healthy.  We ended up placing the graft onto the access site for the existing tunneled dialysis catheter.  At completion there was a very strong thrill in the graft.  The pt tolerated the procedure well and was transported to the PACU in good condition.  He was kept overnight for  observation.    POD 1, pt doing well and is discharged so that he can make his outpatient HD appt.     CBC    Component Value Date/Time   WBC 6.7 02/06/2021 1033   RBC 4.07 (L) 02/06/2021 1033   HGB 12.2 (L) 02/15/2021 0604   HCT 36.0 (L) 02/15/2021 0604   PLT 192.0 02/06/2021 1033   MCV 97.5 02/06/2021 1033   MCH 27.3 12/16/2018 1350   MCHC 33.1 02/06/2021 1033   RDW 17.1 (H) 02/06/2021 1033   LYMPHSABS 1.5 02/06/2021 1033   MONOABS 0.7 02/06/2021 1033   EOSABS 0.4 02/06/2021 1033   BASOSABS 0.1 02/06/2021 1033    BMET    Component Value Date/Time   NA 136 02/15/2021 0604   NA 137 05/26/2018 0000   K 5.2 (H) 02/15/2021 0604   CL 104 02/15/2021 0604   CO2 31 12/05/2020 1021   GLUCOSE 114 (H) 02/15/2021 0604   BUN 69 (H) 02/15/2021 0604   CREATININE >18.00 (H) 02/15/2021 0604   CALCIUM 9.3 12/05/2020 1021   GFRNONAA 4 (L) 12/16/2018 1350   GFRAA 5 (L) 12/16/2018 1350      Discharge Instructions     Discharge patient   Complete by: As directed    Discharge disposition: 01-Home or Self Care   Discharge patient date: 02/16/2021       Discharge Diagnosis:  ESRD on dialysis [facility]) [N18.6, Z99.2]  Secondary Diagnosis: Patient Active Problem List   Diagnosis Date Noted   Osteomyelitis  of great toe of left foot ([place]) 02/08/2021   Peripheral neuropathy 02/06/2021   Secondary hyperparathyroidism of renal origin ([place]) 12/07/2020   Hyperlipidemia 12/06/2020   Hemodialysis-associated hypotension 12/05/2020   CHF (congestive heart failure) ([place])    Tobacco use    Marijuana use, continuous    Debility 12/13/2018   Medically noncompliant    Acute encephalopathy    Genital warts 11/03/2018   Meralgia paresthetica of right side 08/13/2018   ESRD on dialysis ([place]) 06/04/2018   Type 2 diabetes mellitus with other diabetic kidney complication ([place]) 64/33/2951   Paroxysmal atrial fibrillation ([place]) 05/24/2018   Essential hypertension 01/25/2017   Morbid obesity ([place])  01/17/2016   Delayed graft function of kidney transplant due to reason other than ATN or rejection requiring acute dialysis ([place]) 11/02/2012   S/P kidney transplant 11/02/2012   OSA (obstructive sleep apnea) 05/26/2011   Past Medical History:  Diagnosis Date   Acute respiratory failure with hypoxia ([place]) 11/30/2018   Anemia    ESRD   ESRD on hemodialysis ([place]) 06/07/2011   TTS [place]. Started HD in 2006, got transplant in 2014 lasted until Dec 2019 then went back on HD.  Has L thigh AVG as of Jun 2020.  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    Hypertension    Morbid obesity ([place])    Paroxysmal atrial fibrillation ([place])    Sepsis ([place]) 11/30/2018   Sleep apnea      Allergies as of 02/16/2021   No Known Allergies      Medication List     TAKE these medications    atorvastatin 40 MG tablet Commonly known as: LIPITOR Take 1 tablet (40 mg total) by mouth daily.   doxycycline 100 MG tablet Commonly known as: VIBRA-TABS Take 1 tablet (100 mg total) by mouth 2 (two) times daily.   ferric citrate 1 GM 210 MG(Fe) tablet Commonly known as: AURYXIA Take 630 mg by mouth 3 (three) times daily with meals.   oxyCODONE-acetaminophen 5-325 MG tablet Commonly known as: Percocet Take 1 tablet by mouth every 6 (six) hours as needed.         Instructions: Vascular and Vein Specialists of [facility] Discharge Instructions AV Fistula or Graft Surgery for Dialysis Access  Please refer to the following instructions for your post-procedure care. Your surgeon or physician assistant will discuss any changes with you.  Activity  You may drive the day following your surgery, if you are comfortable and no longer taking prescription pain medication. Resume full activity as the soreness in your incision resolves.  Bathing/Showering  You may shower after you go home. Keep your incision dry for 48 hours. Do not soak in a bathtub, hot tub, or swim until the incision heals  completely. You may not shower if you have a hemodialysis catheter.  Incision Care  Clean your incision with mild soap and water after 48 hours. Pat the area dry with a clean towel. You do not need a bandage unless otherwise instructed. Do not apply any ointments or creams to your incision. You may have skin glue on your incision. Do not peel it off. It will come off on its own in about one week. Your arm may swell a bit after surgery. To reduce swelling use pillows to elevate your arm so it is above your heart. Your doctor will tell you if you need to lightly wrap your arm with an ACE bandage.  Wash the groin wound with soap and  water daily and pat dry. (No tub bath-only shower)  Then put a dry gauze or washcloth there to keep this area dry daily and as needed.  Do not use Vaseline or neosporin on your incisions.  Only use soap and water on your incisions and then protect and keep dry.   Diet  Resume your normal diet. There are not special food restrictions following this procedure. In order to heal from your surgery, it is CRITICAL to get adequate nutrition. Your body requires vitamins, minerals, and protein. Vegetables are the best source of vitamins and minerals. Vegetables also provide the perfect balance of protein. Processed food has little nutritional value, so try to avoid this.  Medications  Resume taking all of your medications. If your incision is causing pain, you may take over-the counter pain relievers such as acetaminophen (Tylenol). If you were prescribed a stronger pain medication, please be aware these medications can cause nausea and constipation. Prevent nausea by taking the medication with a snack or meal. Avoid constipation by drinking plenty of fluids and eating foods with high amount of fiber, such as fruits, vegetables, and grains. Do not take Tylenol if you are taking prescription pain medications.  Follow up Your surgeon may want to see you in the office following your  access surgery. If so, this will be arranged at the time of your surgery.  Please call us immediately for any of the following conditions:  Increased pain, redness, drainage (pus) from your incision site Fever of 101 degrees or higher Severe or worsening pain at your incision site Hand pain or numbness.  Reduce your risk of vascular disease:  Stop smoking. If you would like help, call [facility] at 1-800-QUIT-NOW 279-214-8714) or [place] at [place] your cholesterol Maintain a desired weight Control your diabetes Keep your blood pressure down  Dialysis  It will take several weeks to several months for your new dialysis access to be ready for use. Your surgeon will determine when it is OK to use it. Your nephrologist will continue to direct your dialysis. You can continue to use your Permcath until your new access is ready for use.   02/16/2021 [name] [id number] 1975/09/10  Surgeon(s): Waynetta Sandy, MD  Procedure(s): Attempted INSERTION OF RIGHT and Left Internal Jugular DIALYSIS CATHETER, Insertion of Left Femoral Vein Dialysis Catheter INSERTION OF ARTERIOVENOUS (AV) GORE-TEX LOOP GRAFT RIGHT THIGH  x Do not stick graft for 4 weeks    If you have any questions, please call the office at [phone].  Prescriptions given: Roxicet #20 No Refill  Disposition: home  Patient's condition: is Good  Follow up: 1. [facility] in 2 weeks on Dr. Donzetta Matters clinic day   Leontine Locket, PA-C [facility] [phone] 02/16/2021  9:23 AM

## 2021-02-16 NOTE — Progress Notes (Signed)
VASCULAR AND VEIN SPECIALISTS OF [place] PROGRESS NOTE  ASSESSMENT / PLAN: [name] is a 45 y.o. male status post left femoral tunneled dialysis catheter and right femoral loop arteriovenous graft 02/15/2021.  Ready for discharge today.  Follow-up with [facility] in 2 to 3 weeks for wound check.  SUBJECTIVE: No complaints.  Tolerated diet.  Ready to go home.  OBJECTIVE: BP 129/90 (BP Location: Left Arm)   Pulse 90   Temp 98.4 F (36.9 C) (Oral)   Resp 17   Ht 6\' 2"  (1.88 m)   Wt (!) 140.6 kg   SpO2 100%   BMI 39.80 kg/m   Intake/Output Summary (Last 24 hours) at 02/16/2021 0955 Last data filed at 02/15/2021 1125 Gross per 24 hour  Intake 600 ml  Output 75 ml  Net 525 ml    No acute distress Left groin tunneled dialysis catheter in place. Right groin clean dry femoral graft incisions.  CBC Latest Ref Rng & Units 02/15/2021 02/06/2021 12/05/2020  WBC 4.0 - 10.5 K/uL - 6.7 5.5  Hemoglobin 13.0 - 17.0 g/dL 12.2(L) 13.1 15.2  Hematocrit 39.0 - 52.0 % 36.0(L) 39.7 45.6  Platelets 150.0 - 400.0 K/uL - 192.0 207.0     CMP Latest Ref Rng & Units 02/15/2021 12/05/2020 12/16/2018  Glucose 70 - 99 mg/dL 114(H) 93 106(H)  BUN 6 - 20 mg/dL 69(H) 41(H) 82(H)  Creatinine 0.61 - 1.24 mg/dL >18.00(H) 11.36(HH) 13.08(H)  Sodium 135 - 145 mmol/L 136 138 131(L)  Potassium 3.5 - 5.1 mmol/L 5.2(H) 4.4 4.6  Chloride 98 - 111 mmol/L 104 93(L) 91(L)  CO2 19 - 32 mEq/L - 31 22  Calcium 8.4 - 10.5 mg/dL - 9.3 9.0  Total Protein 6.0 - 8.3 g/dL - 7.4 -  Total Bilirubin 0.2 - 1.2 mg/dL - 0.5 -  Alkaline Phos 39 - 117 U/L - 38(L) -  AST 0 - 37 U/L - 11 -  ALT 0 - 53 U/L - 10 -    CrCl cannot be calculated (This lab value cannot be used to calculate CrCl because it is not a number: >18.00).  [name]. Robert Breed, MD Vascular and Vein Specialists of [facility] Phone Number: [phone] 02/16/2021 9:55 AM

## 2021-02-17 ENCOUNTER — Telehealth: Payer: Self-pay | Admitting: Nephrology

## 2021-02-17 NOTE — Telephone Encounter (Signed)
Transition of Care Contact from [place]  Date of Discharge: 02/16/21  Date of Contact: 02/17/21 Method of contact: phone - attempted  Attempted to contact patient to discuss transition of care from inpatient admission.  Patient did not answer the phone.  Message was left on patient's voicemail informing them we would attempt to call them again and if unable to reach will follow up at dialysis.  Jen Mow, PA-C [facility] Pager: [phone]

## 2021-02-18 ENCOUNTER — Encounter (HOSPITAL_COMMUNITY): Payer: Self-pay | Admitting: Vascular Surgery

## 2021-02-18 DIAGNOSIS — T829XXA Unspecified complication of cardiac and vascular prosthetic device, implant and graft, initial encounter: Secondary | ICD-10-CM | POA: Insufficient documentation

## 2021-02-28 ENCOUNTER — Ambulatory Visit (INDEPENDENT_AMBULATORY_CARE_PROVIDER_SITE_OTHER): Payer: Medicare Other | Admitting: Orthopedic Surgery

## 2021-02-28 DIAGNOSIS — M869 Osteomyelitis, unspecified: Secondary | ICD-10-CM | POA: Diagnosis not present

## 2021-03-04 ENCOUNTER — Ambulatory Visit (INDEPENDENT_AMBULATORY_CARE_PROVIDER_SITE_OTHER): Payer: Medicare Other | Admitting: Family Medicine

## 2021-03-04 ENCOUNTER — Other Ambulatory Visit: Payer: Self-pay

## 2021-03-04 ENCOUNTER — Encounter: Payer: Self-pay | Admitting: Family Medicine

## 2021-03-04 VITALS — BP 100/70 | HR 80 | Temp 97.1°F | Ht 74.0 in | Wt 302.6 lb

## 2021-03-04 DIAGNOSIS — Z992 Dependence on renal dialysis: Secondary | ICD-10-CM

## 2021-03-04 DIAGNOSIS — N186 End stage renal disease: Secondary | ICD-10-CM

## 2021-03-04 DIAGNOSIS — L97522 Non-pressure chronic ulcer of other part of left foot with fat layer exposed: Secondary | ICD-10-CM | POA: Diagnosis not present

## 2021-03-04 DIAGNOSIS — E1129 Type 2 diabetes mellitus with other diabetic kidney complication: Secondary | ICD-10-CM | POA: Diagnosis not present

## 2021-03-04 DIAGNOSIS — I10 Essential (primary) hypertension: Secondary | ICD-10-CM | POA: Diagnosis not present

## 2021-03-04 NOTE — Progress Notes (Signed)
[facility] [address] [place] [address] Dept: [phone] Dept Fax: [phone]  Chronic Care Office Visit  Subjective:    Patient ID: [name], male    DOB: Apr 22, 1976, 45 y.o..   MRN: [id number]  Chief Complaint  Patient presents with   Follow-up    4 wk f/u   History of Present Illness:  Patient is in today for reassessment of chronic medical issues.  Mr. Hufford has a history of a left foot ulcer. He was seen by orthopedics, who did not feel there was any osteomyelitis present. He was fitted with a cast shoe with a toe pad to take pressure of this area. He id doing home wound care to try and heal this.  Mr. Furio has a history of ESRD on dialysis and Type 2 diabetes. He is not on therapy for diabetes and has been at goal in the past. It is unclear how significant the diabetes may have been in the past. He had previously been on the transplant list for his kidney failure. He was taken off, apparently due to hypotension. He was seen by Dr. Sallyanne Kuster (cardiology), who found his blood pressure readings in his arms to be inaccurate. He has normal pressures when this is checked in the legs. Dr. Recardo Evangelist has advised his transplant doctors of this assessment. Mr. Marszalek has still not heard back from the transplant team on his status.   Mr. Metoyer has hyperlipidemia and was recently started on atorvastatin.  Past Medical History: Patient Active Problem List   Diagnosis Date Noted   Complication of vascular dialysis catheter 02/18/2021   Peripheral neuropathy 02/06/2021   Secondary hyperparathyroidism of renal origin ([place]) 12/07/2020   Hyperlipidemia 12/06/2020   CHF (congestive heart failure) ([place])    Tobacco use    Marijuana use, continuous    Debility 12/13/2018   Medically noncompliant    Acute encephalopathy    Genital warts 11/03/2018   Meralgia paresthetica of right side 08/13/2018   ESRD on dialysis  ([place]) 06/04/2018   Type 2 diabetes mellitus with other diabetic kidney complication ([place]) 14/97/0263   Paroxysmal atrial fibrillation ([place]) 05/24/2018   Essential hypertension 01/25/2017   Morbid obesity ([place]) 01/17/2016   Delayed graft function of kidney transplant due to reason other than ATN or rejection requiring acute dialysis ([place]) 11/02/2012   S/P kidney transplant 11/02/2012   OSA (obstructive sleep apnea) 05/26/2011   Past Surgical History:  Procedure Laterality Date   ARTERIOVENOUS GRAFT PLACEMENT  08/09/2010   Left Thigh Graft by Dr. [name]   AV FISTULA PLACEMENT Right 05/18/2018   Procedure: INSERTION OF ARTERIOVENOUS (AV) GORE-TEX GRAFT Left THIGH;  Surgeon: Waynetta Sandy, MD;  Location: [facility];  Service: Vascular;  Laterality: Right;   AV FISTULA PLACEMENT Right 02/15/2021   Procedure: INSERTION OF ARTERIOVENOUS (AV) GORE-TEX LOOP GRAFT RIGHT THIGH;  Surgeon: Waynetta Sandy, MD;  Location: [facility];  Service: Vascular;  Laterality: Right;   CAPD REMOVAL  05/08/2011   Procedure: CONTINUOUS AMBULATORY PERITONEAL DIALYSIS  (CAPD) CATHETER REMOVAL;  Surgeon: [name], MD;  Location: [facility];  Service: General;  Laterality: N/A;  Removal of CAPD catheter, Dr. requests to go after 100   INSERTION OF DIALYSIS CATHETER  10/05/2010   Right Femoral Cath insertion by Dr. [name].  Pt ahas had several caths inserted.   INSERTION OF DIALYSIS CATHETER Right 02/15/2021   Procedure: Attempted INSERTION OF RIGHT and Left Internal Jugular  DIALYSIS CATHETER, Insertion of Left Femoral Vein Dialysis Catheter;  Surgeon: Waynetta Sandy, MD;  Location: [facility];  Service: Vascular;  Laterality: Right;   IR FLUORO GUIDE CV LINE RIGHT  05/11/2018   IR US GUIDE VASC ACCESS RIGHT  05/11/2018   KIDNEY TRANSPLANT  2014   KNEE ARTHROSCOPY Left    UPPER EXTREMITY ANGIOGRAPHY Bilateral 05/13/2018   Procedure: UPPER EXTREMITY ANGIOGRAPHY - bilarteral;  Surgeon:  Marty Heck, MD;  Location: [facility];  Service: Cardiovascular;  Laterality: Bilateral;   Family History  Problem Relation Age of Onset   Diabetes Father    Stroke Father    Stroke Maternal Grandmother    Anesthesia problems Neg Hx    Outpatient Medications Prior to Visit  Medication Sig Dispense Refill   atorvastatin (LIPITOR) 40 MG tablet Take 1 tablet (40 mg total) by mouth daily. 90 tablet 3   doxycycline (VIBRA-TABS) 100 MG tablet Take 1 tablet (100 mg total) by mouth 2 (two) times daily. 30 tablet 0   ferric citrate (AURYXIA) 1 GM 210 MG(Fe) tablet Take 630 mg by mouth 3 (three) times daily with meals.     oxyCODONE-acetaminophen (PERCOCET) 5-325 MG tablet Take 1 tablet by mouth every 6 (six) hours as needed. 20 tablet 0   No facility-administered medications prior to visit.   No Known Allergies    Objective:   Today's Vitals   03/04/21 1327  BP: 100/70  Pulse: 80  Temp: (!) 97.1 F (36.2 C)  TempSrc: Temporal  SpO2: 98%  Weight: (!) 302 lb 9.6 oz (137.3 kg)  Height: 6\' 2"  (1.88 m)   Body mass index is 38.85 kg/m.   General: Well developed, well nourished. No acute distress. Foot: Prior ulcer has had debridement of prominent borders. There remains a small central erosion with fat present in base. Woudn is clean with no sign of purulent drainage. Psych: Alert and oriented x3. Normal mood and affect.  Health Maintenance Due  Topic Date Due   COVID-19 Vaccine (1) Never done   FOOT EXAM  Never done   URINE MICROALBUMIN  Never done   Hepatitis C Screening  Never done   OPHTHALMOLOGY EXAM  05/19/2019   COLONOSCOPY (Pts 45-74yrs Insurance coverage will need to be confirmed)  Never done   INFLUENZA VACCINE  01/07/2021   Lab Results Lab Results  Component Value Date   HGBA1C 6.5 12/05/2020     Assessment & Plan:   1. Type 2 diabetes mellitus with other diabetic kidney complication (HCC) No longer needing medication due to ESRD. I will check an  HbA1c with his next renal lab draw. I will plan to see him back in 3 months.  - Hemoglobin A1c; Future  2. Essential hypertension Blood pressure is stable off meds.  3. ESRD on dialysis [facility]) Continue dialysis and follow-up with transplant team.  4. Ulcer of left foot with fat layer exposed ([place]) Ulcer is mildly improved. Patient due for follow-up with orthopedics.  Haydee Salter, MD

## 2021-03-17 ENCOUNTER — Encounter: Payer: Self-pay | Admitting: Orthopedic Surgery

## 2021-03-17 NOTE — Progress Notes (Signed)
Office Visit Note   Patient: [name]           Date of Birth: April 27, 1976           MRN: [id number] Visit Date: 02/28/2021              Requested by: Haydee Salter, MD [address] [place],  [place] [address] PCP: Haydee Salter, MD  Chief Complaint  Patient presents with   Left Foot - Follow-up    GT ulcer       HPI: Patient is a 45 year old gentleman who presents in follow-up for ulcer left great toe plantar aspect IP joint.  Patient states he does have some drainage he is currently in regular shoewear radiographs were obtained at the last visit.  Patient is currently on doxycycline ankle-brachial indices in July.  Patient's most recent hemoglobin A1c is 6.5 he does have end-stage renal disease and is on dialysis.  Assessment & Plan: Visit Diagnoses:  1. Osteomyelitis of great toe of left foot ([place])     Plan: Patient was given a postoperative shoe with a felt relieving donut.  Discussed that we may need to consider a dorsiflexion closing wedge osteotomy for the first metatarsal and possible gastrocnemius recession.  Follow-Up Instructions: Return in about 4 weeks (around 03/28/2021).   Ortho Exam  Patient is alert, oriented, no adenopathy, well-dressed, normal affect, normal respiratory effort. Examination patient has strongly palpable pulses bilaterally.  He has an ulcer on the plantar aspect of the IP joint of the left great toe this is 10 mm in diameter there is healthy granulation tissue at the base there is no cellulitis no drainage no exposed bone or tendon.  Imaging: No results found. No images are attached to the encounter.  Labs: Lab Results  Component Value Date   HGBA1C 6.5 12/05/2020   HGBA1C 5.8 (H) 05/11/2018   HGBA1C 6.3 01/25/2018   ESRSEDRATE 75 (H) 02/06/2021   CRP 2.8 02/06/2021   REPTSTATUS 12/03/2018 FINAL 12/01/2018   GRAMSTAIN  12/01/2018    RARE WBC PRESENT,BOTH PMN AND MONONUCLEAR NO ORGANISMS SEEN    CULT   12/01/2018    NO GROWTH 2 DAYS Performed at [facility], [place] [address] [place], [place] [address]    LABORGA SERRATIA MARCESCENS 05/08/2011     Lab Results  Component Value Date   ALBUMIN 4.2 12/05/2020   ALBUMIN 2.7 (L) 12/16/2018   ALBUMIN 2.5 (L) 12/14/2018    Lab Results  Component Value Date   MG 2.8 (H) 12/13/2018   MG 3.1 (H) 12/09/2018   MG 2.7 (H) 12/08/2018   Lab Results  Component Value Date   VD25OH 21.0 05/26/2018   VD25OH 12.7 (L) 05/11/2018    No results found for: PREALBUMIN CBC EXTENDED Latest Ref Rng & Units 02/15/2021 02/06/2021 12/05/2020  WBC 4.0 - 10.5 K/uL - 6.7 5.5  RBC 4.22 - 5.81 Mil/uL - 4.07(L) 4.78  HGB 13.0 - 17.0 g/dL 12.2(L) 13.1 15.2  HCT 39.0 - 52.0 % 36.0(L) 39.7 45.6  PLT 150.0 - 400.0 K/uL - 192.0 207.0  NEUTROABS 1.4 - 7.7 K/uL - 4.1 -  LYMPHSABS 0.7 - 4.0 K/uL - 1.5 -     There is no height or weight on file to calculate BMI.  Orders:  No orders of the defined types were placed in this encounter.  No orders of the defined types were placed in this encounter.    Procedures: No procedures performed  Clinical Data: No additional findings.  ROS:  All other systems negative, except as noted in the HPI. Review of Systems  Objective: Vital Signs: There were no vitals taken for this visit.  Specialty Comments:  No specialty comments available.  PMFS History: Patient Active Problem List   Diagnosis Date Noted   Complication of vascular dialysis catheter 02/18/2021   Peripheral neuropathy 02/06/2021   Secondary hyperparathyroidism of renal origin ([place]) 12/07/2020   Hyperlipidemia 12/06/2020   CHF (congestive heart failure) ([place])    Tobacco use    Marijuana use, continuous    Debility 12/13/2018   Medically noncompliant    Acute encephalopathy    Genital warts 11/03/2018   Meralgia paresthetica of right side 08/13/2018   ESRD on dialysis ([place]) 06/04/2018   Type 2 diabetes mellitus with other diabetic  kidney complication ([place]) 11/30/7626   Paroxysmal atrial fibrillation ([place]) 05/24/2018   Essential hypertension 01/25/2017   Morbid obesity ([place]) 01/17/2016   Delayed graft function of kidney transplant due to reason other than ATN or rejection requiring acute dialysis ([place]) 11/02/2012   S/P kidney transplant 11/02/2012   OSA (obstructive sleep apnea) 05/26/2011   Past Medical History:  Diagnosis Date   Acute respiratory failure with hypoxia ([place]) 11/30/2018   Anemia    ESRD   ESRD on hemodialysis ([place]) 06/07/2011   TTS [place]. Started HD in 2006, got transplant in 2014 lasted until Dec 2019 then went back on HD.  Has L thigh AVG as of Jun 2020.  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    Hypertension    Morbid obesity ([place])    Paroxysmal atrial fibrillation ([place])    Sepsis ([place]) 11/30/2018   Sleep apnea     Family History  Problem Relation Age of Onset   Diabetes Father    Stroke Father    Stroke Maternal Grandmother    Anesthesia problems Neg Hx     Past Surgical History:  Procedure Laterality Date   ARTERIOVENOUS GRAFT PLACEMENT  08/09/2010   Left Thigh Graft by Dr. [name]   AV FISTULA PLACEMENT Right 05/18/2018   Procedure: INSERTION OF ARTERIOVENOUS (AV) GORE-TEX GRAFT Left THIGH;  Surgeon: Waynetta Sandy, MD;  Location: [facility];  Service: Vascular;  Laterality: Right;   AV FISTULA PLACEMENT Right 02/15/2021   Procedure: INSERTION OF ARTERIOVENOUS (AV) GORE-TEX LOOP GRAFT RIGHT THIGH;  Surgeon: Waynetta Sandy, MD;  Location: [facility];  Service: Vascular;  Laterality: Right;   CAPD REMOVAL  05/08/2011   Procedure: CONTINUOUS AMBULATORY PERITONEAL DIALYSIS  (CAPD) CATHETER REMOVAL;  Surgeon: [name], MD;  Location: [facility];  Service: General;  Laterality: N/A;  Removal of CAPD catheter, Dr. requests to go after [place]  10/05/2010   Right Femoral Cath insertion by Dr. [name].  Pt ahas had several  caths inserted.   INSERTION OF DIALYSIS CATHETER Right 02/15/2021   Procedure: Attempted INSERTION OF RIGHT and Left Internal Jugular DIALYSIS CATHETER, Insertion of Left Femoral Vein Dialysis Catheter;  Surgeon: Waynetta Sandy, MD;  Location: [facility];  Service: Vascular;  Laterality: Right;   IR FLUORO GUIDE CV LINE RIGHT  05/11/2018   IR US GUIDE VASC ACCESS RIGHT  05/11/2018   KIDNEY TRANSPLANT  2014   KNEE ARTHROSCOPY Left    UPPER EXTREMITY ANGIOGRAPHY Bilateral 05/13/2018   Procedure: UPPER EXTREMITY ANGIOGRAPHY - bilarteral;  Surgeon: Marty Heck, MD;  Location: [facility];  Service: Cardiovascular;  Laterality: Bilateral;   Social History   Occupational History   Not on file  Tobacco Use   Smoking status: Former    Types: Cigarettes    Quit date: 11/08/2015    Years since quitting: 5.3   Smokeless tobacco: Never   Tobacco comments:    Smokes a cigarette about every 3-4 days.  Vaping Use   Vaping Use: Never used  Substance and Sexual Activity   Alcohol use: Yes    Comment: Socially   Drug use: Yes    Frequency: 10.0 times per week    Types: Marijuana    Comment: 1-2 joints per day   Sexual activity: Yes

## 2021-03-25 ENCOUNTER — Encounter: Payer: Self-pay | Admitting: Orthopedic Surgery

## 2021-03-25 ENCOUNTER — Ambulatory Visit (INDEPENDENT_AMBULATORY_CARE_PROVIDER_SITE_OTHER): Payer: Medicare Other | Admitting: Orthopedic Surgery

## 2021-03-25 DIAGNOSIS — L97529 Non-pressure chronic ulcer of other part of left foot with unspecified severity: Secondary | ICD-10-CM

## 2021-03-25 DIAGNOSIS — E11621 Type 2 diabetes mellitus with foot ulcer: Secondary | ICD-10-CM

## 2021-03-25 NOTE — Progress Notes (Signed)
Office Visit Note   Patient: [name]           Date of Birth: [birth year]           MRN: [id number] Visit Date: 03/25/2021              Requested by: Haydee Salter, MD [address] [place],  [place] [address] PCP: Haydee Salter, MD  Chief Complaint  Patient presents with   Left Foot - Open Wound, Follow-up    Left great toe ulcer      HPI: Patient is a 45 year old gentleman with diabetic insensate neuropathy ulceration IP joint left great toe currently wearing a postoperative shoe and they felt relieving donut.  He has completed his antibiotics.  Assessment & Plan: Visit Diagnoses:  1. Ulcer of left great toe due to diabetes mellitus ([place])     Plan: Continue with a felt donut and the postoperative shoe anticipate advancing to regular shoewear at follow-up.  Follow-Up Instructions: Return in about 4 weeks (around 04/22/2021).   Ortho Exam  Patient is alert, oriented, no adenopathy, well-dressed, normal affect, normal respiratory effort. Examination there is no swelling no cellulitis no pain no drainage the ulcer is about 5 mm in diameter.  After informed consent a 10 blade knife was used to pare the ulcer back to healthy viable granulation tissue which is 10 mm in diameter 1 mm deep with no exposed bone or tendon.  Imaging: No results found. No images are attached to the encounter.  Labs: Lab Results  Component Value Date   HGBA1C 6.5 12/05/2020   HGBA1C 5.8 (H) 05/11/2018   HGBA1C 6.3 01/25/2018   ESRSEDRATE 75 (H) 02/06/2021   CRP 2.8 02/06/2021   REPTSTATUS 12/03/2018 FINAL 12/01/2018   GRAMSTAIN  12/01/2018    RARE WBC PRESENT,BOTH PMN AND MONONUCLEAR NO ORGANISMS SEEN    CULT  12/01/2018    NO GROWTH 2 DAYS Performed at [facility], [place] [address] [place], [place] [address]    LABORGA SERRATIA MARCESCENS 05/08/2011     Lab Results  Component Value Date   ALBUMIN 4.2 12/05/2020   ALBUMIN 2.7 (L) 12/16/2018   ALBUMIN  2.5 (L) 12/14/2018    Lab Results  Component Value Date   MG 2.8 (H) 12/13/2018   MG 3.1 (H) 12/09/2018   MG 2.7 (H) 12/08/2018   Lab Results  Component Value Date   VD25OH 21.0 05/26/2018   VD25OH 12.7 (L) 05/11/2018    No results found for: PREALBUMIN CBC EXTENDED Latest Ref Rng & Units 02/15/2021 02/06/2021 12/05/2020  WBC 4.0 - 10.5 K/uL - 6.7 5.5  RBC 4.22 - 5.81 Mil/uL - 4.07(L) 4.78  HGB 13.0 - 17.0 g/dL 12.2(L) 13.1 15.2  HCT 39.0 - 52.0 % 36.0(L) 39.7 45.6  PLT 150.0 - 400.0 K/uL - 192.0 207.0  NEUTROABS 1.4 - 7.7 K/uL - 4.1 -  LYMPHSABS 0.7 - 4.0 K/uL - 1.5 -     There is no height or weight on file to calculate BMI.  Orders:  No orders of the defined types were placed in this encounter.  No orders of the defined types were placed in this encounter.    Procedures: No procedures performed  Clinical Data: No additional findings.  ROS:  All other systems negative, except as noted in the HPI. Review of Systems  Objective: Vital Signs: There were no vitals taken for this visit.  Specialty Comments:  No specialty comments available.  [place]  History: Patient Active Problem List   Diagnosis Date Noted   Complication of vascular dialysis catheter 02/18/2021   Peripheral neuropathy 02/06/2021   Secondary hyperparathyroidism of renal origin ([place]) 12/07/2020   Hyperlipidemia 12/06/2020   CHF (congestive heart failure) ([place])    Tobacco use    Marijuana use, continuous    Debility 12/13/2018   Medically noncompliant    Acute encephalopathy    Genital warts 11/03/2018   Meralgia paresthetica of right side 08/13/2018   ESRD on dialysis ([place]) 06/04/2018   Type 2 diabetes mellitus with other diabetic kidney complication ([place]) 32/20/2542   Paroxysmal atrial fibrillation ([place]) 05/24/2018   Essential hypertension 01/25/2017   Morbid obesity ([place]) 01/17/2016   Delayed graft function of kidney transplant due to reason other than ATN or rejection requiring acute  dialysis ([place]) 11/02/2012   S/P kidney transplant 11/02/2012   OSA (obstructive sleep apnea) 05/26/2011   Past Medical History:  Diagnosis Date   Acute respiratory failure with hypoxia ([place]) 11/30/2018   Anemia    ESRD   ESRD on hemodialysis ([place]) 06/07/2011   TTS [facility]. Started HD in 2006, got transplant in 2014 lasted until Dec 2019 then went back on HD.  Has L thigh AVG as of Jun 2020.  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    Hypertension    Morbid obesity ([place])    Paroxysmal atrial fibrillation ([place])    Sepsis ([place]) 11/30/2018   Sleep apnea     Family History  Problem Relation Age of Onset   Diabetes Father    Stroke Father    Stroke Maternal Grandmother    Anesthesia problems Neg Hx     Past Surgical History:  Procedure Laterality Date   ARTERIOVENOUS GRAFT PLACEMENT  08/09/2010   Left Thigh Graft by Dr. [name]   AV FISTULA PLACEMENT Right 05/18/2018   Procedure: INSERTION OF ARTERIOVENOUS (AV) GORE-TEX GRAFT Left THIGH;  Surgeon: Waynetta Sandy, MD;  Location: [facility];  Service: Vascular;  Laterality: Right;   AV FISTULA PLACEMENT Right 02/15/2021   Procedure: INSERTION OF ARTERIOVENOUS (AV) GORE-TEX LOOP GRAFT RIGHT THIGH;  Surgeon: Waynetta Sandy, MD;  Location: [facility];  Service: Vascular;  Laterality: Right;   CAPD REMOVAL  05/08/2011   Procedure: CONTINUOUS AMBULATORY PERITONEAL DIALYSIS  (CAPD) CATHETER REMOVAL;  Surgeon: [name], MD;  Location: [facility];  Service: General;  Laterality: N/A;  Removal of CAPD catheter, Dr. requests to go after [place]  10/05/2010   Right Femoral Cath insertion by Dr. [name].  Pt ahas had several caths inserted.   INSERTION OF DIALYSIS CATHETER Right 02/15/2021   Procedure: Attempted INSERTION OF RIGHT and Left Internal Jugular DIALYSIS CATHETER, Insertion of Left Femoral Vein Dialysis Catheter;  Surgeon: Waynetta Sandy, MD;  Location: [facility];   Service: Vascular;  Laterality: Right;   IR FLUORO GUIDE CV LINE RIGHT  05/11/2018   IR US GUIDE VASC ACCESS RIGHT  05/11/2018   KIDNEY TRANSPLANT  2014   KNEE ARTHROSCOPY Left    UPPER EXTREMITY ANGIOGRAPHY Bilateral 05/13/2018   Procedure: UPPER EXTREMITY ANGIOGRAPHY - bilarteral;  Surgeon: Marty Heck, MD;  Location: [facility];  Service: Cardiovascular;  Laterality: Bilateral;   Social History   Occupational History   Not on file  Tobacco Use   Smoking status: Former    Types: Cigarettes    Quit date: 11/08/2015    Years since  quitting: 5.3   Smokeless tobacco: Never   Tobacco comments:    Smokes a cigarette about every 3-4 days.  Vaping Use   Vaping Use: Never used  Substance and Sexual Activity   Alcohol use: Yes    Comment: Socially   Drug use: Yes    Frequency: 10.0 times per week    Types: Marijuana    Comment: 1-2 joints per day   Sexual activity: Yes

## 2021-04-22 ENCOUNTER — Ambulatory Visit: Payer: Medicare Other | Admitting: Orthopedic Surgery

## 2021-04-26 ENCOUNTER — Encounter (HOSPITAL_COMMUNITY): Payer: Self-pay | Admitting: Vascular Surgery

## 2021-04-26 ENCOUNTER — Ambulatory Visit (HOSPITAL_COMMUNITY): Payer: Medicare Other | Admitting: Certified Registered"

## 2021-04-26 ENCOUNTER — Ambulatory Visit (HOSPITAL_COMMUNITY): Payer: Medicare Other

## 2021-04-26 ENCOUNTER — Other Ambulatory Visit: Payer: Self-pay

## 2021-04-26 ENCOUNTER — Inpatient Hospital Stay (HOSPITAL_COMMUNITY)
Admission: RE | Admit: 2021-04-26 | Discharge: 2021-04-27 | DRG: 252 | Disposition: A | Discharge status: Home / Self Care | Payer: Medicare Other | Source: Ambulatory Visit | Attending: Vascular Surgery | Admitting: Vascular Surgery

## 2021-04-26 ENCOUNTER — Encounter (HOSPITAL_COMMUNITY)
Admission: RE | Disposition: A | Discharge status: Home / Self Care | Payer: Self-pay | Source: Ambulatory Visit | Attending: Vascular Surgery

## 2021-04-26 DIAGNOSIS — I120 Hypertensive chronic kidney disease with stage 5 chronic kidney disease or end stage renal disease: Secondary | ICD-10-CM | POA: Diagnosis present

## 2021-04-26 DIAGNOSIS — Z20822 Contact with and (suspected) exposure to covid-19: Secondary | ICD-10-CM | POA: Diagnosis present

## 2021-04-26 DIAGNOSIS — Z940 Kidney transplant status: Secondary | ICD-10-CM

## 2021-04-26 DIAGNOSIS — Z79899 Other long term (current) drug therapy: Secondary | ICD-10-CM

## 2021-04-26 DIAGNOSIS — N186 End stage renal disease: Secondary | ICD-10-CM | POA: Diagnosis present

## 2021-04-26 DIAGNOSIS — Z992 Dependence on renal dialysis: Secondary | ICD-10-CM

## 2021-04-26 DIAGNOSIS — G4733 Obstructive sleep apnea (adult) (pediatric): Secondary | ICD-10-CM | POA: Diagnosis present

## 2021-04-26 DIAGNOSIS — Y838 Other surgical procedures as the cause of abnormal reaction of the patient, or of later complication, without mention of misadventure at the time of the procedure: Secondary | ICD-10-CM | POA: Diagnosis present

## 2021-04-26 DIAGNOSIS — T82868A Thrombosis of vascular prosthetic devices, implants and grafts, initial encounter: Principal | ICD-10-CM | POA: Diagnosis present

## 2021-04-26 DIAGNOSIS — Z823 Family history of stroke: Secondary | ICD-10-CM | POA: Diagnosis not present

## 2021-04-26 DIAGNOSIS — Z87891 Personal history of nicotine dependence: Secondary | ICD-10-CM

## 2021-04-26 DIAGNOSIS — T827XXA Infection and inflammatory reaction due to other cardiac and vascular devices, implants and grafts, initial encounter: Secondary | ICD-10-CM | POA: Diagnosis not present

## 2021-04-26 DIAGNOSIS — T82591A Other mechanical complication of surgically created arteriovenous shunt, initial encounter: Secondary | ICD-10-CM | POA: Insufficient documentation

## 2021-04-26 HISTORY — PX: THROMBECTOMY W/ EMBOLECTOMY: SHX2507

## 2021-04-26 HISTORY — PX: INSERTION OF DIALYSIS CATHETER: SHX1324

## 2021-04-26 LAB — GLUCOSE, CAPILLARY: Glucose-Capillary: 112 mg/dL — ABNORMAL HIGH (ref 70–99)

## 2021-04-26 LAB — CBC
HCT: 35.3 % — ABNORMAL LOW (ref 39.0–52.0)
Hemoglobin: 11.6 g/dL — ABNORMAL LOW (ref 13.0–17.0)
MCH: 32.2 pg (ref 26.0–34.0)
MCHC: 32.9 g/dL (ref 30.0–36.0)
MCV: 98.1 fL (ref 80.0–100.0)
Platelets: 190 10*3/uL (ref 150–400)
RBC: 3.6 MIL/uL — ABNORMAL LOW (ref 4.22–5.81)
RDW: 15.2 % (ref 11.5–15.5)
WBC: 8.3 10*3/uL (ref 4.0–10.5)
nRBC: 0 % (ref 0.0–0.2)

## 2021-04-26 LAB — POCT I-STAT, CHEM 8
BUN: 86 mg/dL — ABNORMAL HIGH (ref 6–20)
Calcium, Ion: 0.9 mmol/L — ABNORMAL LOW (ref 1.15–1.40)
Chloride: 98 mmol/L (ref 98–111)
Creatinine, Ser: >18 mg/dL — ABNORMAL HIGH (ref 0.61–1.24)
Glucose, Bld: 82 mg/dL (ref 70–99)
HCT: 43 % (ref 39.0–52.0)
Hemoglobin: 14.6 g/dL (ref 13.0–17.0)
Potassium: 5.8 mmol/L — ABNORMAL HIGH (ref 3.5–5.1)
Sodium: 137 mmol/L (ref 135–145)
TCO2: 29 mmol/L (ref 22–32)

## 2021-04-26 LAB — TYPE AND SCREEN
ABO/RH(D): A POS
Antibody Screen: NEGATIVE

## 2021-04-26 LAB — CREATININE, SERUM
Creatinine, Ser: 18 mg/dL — ABNORMAL HIGH (ref 0.61–1.24)
GFR, Estimated: 3 mL/min — ABNORMAL LOW (ref >60)

## 2021-04-26 LAB — SURGICAL PCR SCREEN
MRSA, PCR: NEGATIVE
Staphylococcus aureus: NEGATIVE

## 2021-04-26 LAB — HIV ANTIBODY (ROUTINE TESTING W REFLEX): HIV Screen 4th Generation wRfx: NONREACTIVE

## 2021-04-26 SURGERY — INSERTION OF DIALYSIS CATHETER
Anesthesia: General | Site: Leg Upper | Laterality: Right

## 2021-04-26 MED ORDER — FERRIC CITRATE 1 GM 210 MG(FE) PO TABS
630.0000 mg | ORAL_TABLET | Freq: Three times a day (TID) | ORAL | Status: DC
  Administered 2021-04-27 (×3): 630 mg via ORAL
  Filled 2021-04-26 (×3): qty 3

## 2021-04-26 MED ORDER — NEOSTIGMINE METHYLSULFATE 3 MG/3ML IV SOSY
PREFILLED_SYRINGE | INTRAVENOUS | Status: AC
  Filled 2021-04-26: qty 6

## 2021-04-26 MED ORDER — HEPARIN SODIUM (PORCINE) 5000 UNIT/ML IJ SOLN: 5000.0000 [IU] | Freq: Three times a day (TID) | INTRAMUSCULAR | Status: DC

## 2021-04-26 MED ORDER — PROPOFOL 10 MG/ML IV BOLUS
INTRAVENOUS | Status: AC
  Filled 2021-04-26: qty 40

## 2021-04-26 MED ORDER — ORAL CARE MOUTH RINSE: 15.0000 mL | Freq: Once | OROMUCOSAL | Status: AC

## 2021-04-26 MED ORDER — SODIUM CHLORIDE 0.9% FLUSH
3.0000 mL | Freq: Two times a day (BID) | INTRAVENOUS | Status: DC
  Administered 2021-04-26 – 2021-04-27 (×2): 3 mL via INTRAVENOUS

## 2021-04-26 MED ORDER — LIDOCAINE 2% (20 MG/ML) 5 ML SYRINGE
INTRAMUSCULAR | Status: DC | PRN
  Administered 2021-04-26: 100 mg via INTRAVENOUS

## 2021-04-26 MED ORDER — CEFAZOLIN SODIUM-DEXTROSE 2-4 GM/100ML-% IV SOLN
2.0000 g | INTRAVENOUS | Status: AC
  Administered 2021-04-26: 2 g via INTRAVENOUS

## 2021-04-26 MED ORDER — METOPROLOL TARTRATE 5 MG/5ML IV SOLN: 2.0000 mg | INTRAVENOUS | Status: DC | PRN

## 2021-04-26 MED ORDER — SODIUM CHLORIDE 0.9 % IV SOLN: INTRAVENOUS | Status: DC

## 2021-04-26 MED ORDER — HYDRALAZINE HCL 20 MG/ML IJ SOLN: 5.0000 mg | INTRAMUSCULAR | Status: DC | PRN

## 2021-04-26 MED ORDER — GLYCOPYRROLATE PF 0.2 MG/ML IJ SOSY
PREFILLED_SYRINGE | INTRAMUSCULAR | Status: AC
  Filled 2021-04-26: qty 2

## 2021-04-26 MED ORDER — HYDROMORPHONE HCL 1 MG/ML IJ SOLN: 0.5000 mg | INTRAMUSCULAR | Status: DC | PRN

## 2021-04-26 MED ORDER — DOCUSATE SODIUM 100 MG PO CAPS
100.0000 mg | ORAL_CAPSULE | Freq: Two times a day (BID) | ORAL | Status: DC
  Administered 2021-04-26 – 2021-04-27 (×2): 100 mg via ORAL
  Filled 2021-04-26 (×2): qty 1

## 2021-04-26 MED ORDER — PHENOL 1.4 % MT LIQD: 1.0000 | OROMUCOSAL | Status: DC | PRN

## 2021-04-26 MED ORDER — ATORVASTATIN CALCIUM 40 MG PO TABS
40.0000 mg | ORAL_TABLET | Freq: Every day | ORAL | Status: DC
  Administered 2021-04-26 – 2021-04-27 (×2): 40 mg via ORAL
  Filled 2021-04-26 (×2): qty 1

## 2021-04-26 MED ORDER — CHLORHEXIDINE GLUCONATE 0.12 % MT SOLN: 15.0000 mL | Freq: Once | OROMUCOSAL | Status: AC

## 2021-04-26 MED ORDER — FENTANYL CITRATE (PF) 250 MCG/5ML IJ SOLN
INTRAMUSCULAR | Status: DC | PRN
  Administered 2021-04-26: 25 ug via INTRAVENOUS
  Administered 2021-04-26 (×2): 50 ug via INTRAVENOUS

## 2021-04-26 MED ORDER — GLYCOPYRROLATE PF 0.2 MG/ML IJ SOSY
PREFILLED_SYRINGE | INTRAMUSCULAR | Status: AC
  Filled 2021-04-26: qty 1

## 2021-04-26 MED ORDER — FENTANYL CITRATE (PF) 250 MCG/5ML IJ SOLN
INTRAMUSCULAR | Status: AC
  Filled 2021-04-26: qty 5

## 2021-04-26 MED ORDER — GUAIFENESIN-DM 100-10 MG/5ML PO SYRP: 15.0000 mL | ORAL_SOLUTION | ORAL | Status: DC | PRN

## 2021-04-26 MED ORDER — GLYCOPYRROLATE PF 0.2 MG/ML IJ SOSY
PREFILLED_SYRINGE | INTRAMUSCULAR | Status: DC | PRN
  Administered 2021-04-26 (×2): .2 mg via INTRAVENOUS

## 2021-04-26 MED ORDER — CHLORHEXIDINE GLUCONATE 0.12 % MT SOLN
OROMUCOSAL | Status: AC
  Administered 2021-04-26: 15 mL via OROMUCOSAL
  Filled 2021-04-26: qty 15

## 2021-04-26 MED ORDER — LABETALOL HCL 5 MG/ML IV SOLN: 10.0000 mg | INTRAVENOUS | Status: DC | PRN

## 2021-04-26 MED ORDER — MIDAZOLAM HCL 2 MG/2ML IJ SOLN
INTRAMUSCULAR | Status: AC
  Filled 2021-04-26: qty 2

## 2021-04-26 MED ORDER — CHLORHEXIDINE GLUCONATE 4 % EX LIQD: 60.0000 mL | Freq: Once | CUTANEOUS | Status: DC

## 2021-04-26 MED ORDER — PANTOPRAZOLE SODIUM 40 MG PO TBEC
40.0000 mg | DELAYED_RELEASE_TABLET | Freq: Every day | ORAL | Status: DC
  Administered 2021-04-26 – 2021-04-27 (×2): 40 mg via ORAL
  Filled 2021-04-26 (×2): qty 1

## 2021-04-26 MED ORDER — DEXAMETHASONE SODIUM PHOSPHATE 10 MG/ML IJ SOLN
INTRAMUSCULAR | Status: AC
  Filled 2021-04-26: qty 1

## 2021-04-26 MED ORDER — HEPARIN SODIUM (PORCINE) 1000 UNIT/ML IJ SOLN
INTRAMUSCULAR | Status: DC | PRN
  Administered 2021-04-26: 1000 [IU] via INTRAVENOUS

## 2021-04-26 MED ORDER — ONDANSETRON HCL 4 MG/2ML IJ SOLN
INTRAMUSCULAR | Status: DC | PRN
  Administered 2021-04-26: 4 mg via INTRAVENOUS

## 2021-04-26 MED ORDER — PROMETHAZINE HCL 25 MG/ML IJ SOLN: 6.2500 mg | INTRAMUSCULAR | Status: DC | PRN

## 2021-04-26 MED ORDER — SENNOSIDES-DOCUSATE SODIUM 8.6-50 MG PO TABS: 1.0000 | ORAL_TABLET | Freq: Every evening | ORAL | Status: DC | PRN

## 2021-04-26 MED ORDER — SODIUM CHLORIDE 0.9 % IV SOLN: 250.0000 mL | INTRAVENOUS | Status: DC | PRN

## 2021-04-26 MED ORDER — ALUM & MAG HYDROXIDE-SIMETH 200-200-20 MG/5ML PO SUSP: 15.0000 mL | ORAL | Status: DC | PRN

## 2021-04-26 MED ORDER — ONDANSETRON HCL 4 MG/2ML IJ SOLN
INTRAMUSCULAR | Status: AC
  Filled 2021-04-26: qty 2

## 2021-04-26 MED ORDER — SODIUM CHLORIDE 0.9% FLUSH: 3.0000 mL | INTRAVENOUS | Status: DC | PRN

## 2021-04-26 MED ORDER — FENTANYL CITRATE (PF) 100 MCG/2ML IJ SOLN
INTRAMUSCULAR | Status: AC
  Filled 2021-04-26: qty 2

## 2021-04-26 MED ORDER — PHENYLEPHRINE 40 MCG/ML (10ML) SYRINGE FOR IV PUSH (FOR BLOOD PRESSURE SUPPORT)
PREFILLED_SYRINGE | INTRAVENOUS | Status: DC | PRN
  Administered 2021-04-26: 20 ug via INTRAVENOUS

## 2021-04-26 MED ORDER — DEXAMETHASONE SODIUM PHOSPHATE 10 MG/ML IJ SOLN
INTRAMUSCULAR | Status: DC | PRN
  Administered 2021-04-26: 5 mg via INTRAVENOUS

## 2021-04-26 MED ORDER — ROCURONIUM BROMIDE 100 MG/10ML IV SOLN
INTRAVENOUS | Status: DC | PRN
  Administered 2021-04-26: 70 mg via INTRAVENOUS

## 2021-04-26 MED ORDER — NEOSTIGMINE METHYLSULFATE 10 MG/10ML IV SOLN
INTRAVENOUS | Status: DC | PRN
  Administered 2021-04-26: 1 mg via INTRAVENOUS
  Administered 2021-04-26: 2 mg via INTRAVENOUS
  Administered 2021-04-26: 3 mg via INTRAVENOUS

## 2021-04-26 MED ORDER — POTASSIUM CHLORIDE CRYS ER 20 MEQ PO TBCR
20.0000 meq | EXTENDED_RELEASE_TABLET | Freq: Once | ORAL | Status: DC
Start: 2021-04-26 — End: 2021-04-26

## 2021-04-26 MED ORDER — 0.9 % SODIUM CHLORIDE (POUR BTL) OPTIME
TOPICAL | Status: DC | PRN
  Administered 2021-04-26: 1000 mL

## 2021-04-26 MED ORDER — PHENYLEPHRINE HCL-NACL 20-0.9 MG/250ML-% IV SOLN
INTRAVENOUS | Status: DC | PRN
  Administered 2021-04-26: 5 ug/min via INTRAVENOUS

## 2021-04-26 MED ORDER — ONDANSETRON HCL 4 MG/2ML IJ SOLN: 4.0000 mg | Freq: Four times a day (QID) | INTRAMUSCULAR | Status: DC | PRN

## 2021-04-26 MED ORDER — PROPOFOL 10 MG/ML IV BOLUS
INTRAVENOUS | Status: DC | PRN
  Administered 2021-04-26: 200 mg via INTRAVENOUS
  Administered 2021-04-26: 100 mg via INTRAVENOUS

## 2021-04-26 MED ORDER — HEPARIN 6000 UNIT IRRIGATION SOLUTION
Status: DC | PRN
  Administered 2021-04-26: 1

## 2021-04-26 MED ORDER — FENTANYL CITRATE (PF) 100 MCG/2ML IJ SOLN
25.0000 ug | INTRAMUSCULAR | Status: DC | PRN
  Administered 2021-04-26 (×2): 50 ug via INTRAVENOUS

## 2021-04-26 MED ORDER — CEFAZOLIN SODIUM-DEXTROSE 2-4 GM/100ML-% IV SOLN
INTRAVENOUS | Status: AC
  Filled 2021-04-26: qty 100

## 2021-04-26 MED ORDER — MEPERIDINE HCL 25 MG/ML IJ SOLN: 6.2500 mg | INTRAMUSCULAR | Status: DC | PRN

## 2021-04-26 MED ORDER — IODIXANOL 320 MG/ML IV SOLN
INTRAVENOUS | Status: DC | PRN
  Administered 2021-04-26: 5 mL

## 2021-04-26 MED ORDER — OXYCODONE-ACETAMINOPHEN 5-325 MG PO TABS
1.0000 | ORAL_TABLET | ORAL | Status: DC | PRN
  Administered 2021-04-26: 2 via ORAL
  Filled 2021-04-26: qty 2

## 2021-04-26 MED ORDER — BISACODYL 5 MG PO TBEC: 5.0000 mg | DELAYED_RELEASE_TABLET | Freq: Every day | ORAL | Status: DC | PRN

## 2021-04-26 SURGICAL SUPPLY — 75 items
ADH SKN CLS APL DERMABOND .7 (GAUZE/BANDAGES/DRESSINGS) ×2
APL PRP STRL LF DISP 70% ISPRP (MISCELLANEOUS) ×4
APL SKNCLS STERI-STRIP NONHPOA (GAUZE/BANDAGES/DRESSINGS) ×2
ARMBAND PINK RESTRICT EXTREMIT (MISCELLANEOUS) ×4 IMPLANT
BAG COUNTER SPONGE SURGICOUNT (BAG) ×3 IMPLANT
BAG DECANTER FOR FLEXI CONT (MISCELLANEOUS) ×2 IMPLANT
BAG SPNG CNTER NS LX DISP (BAG) ×2
BENZOIN TINCTURE PRP APPL 2/3 (GAUZE/BANDAGES/DRESSINGS) ×3 IMPLANT
BIOPATCH RED 1 DISK 7.0 (GAUZE/BANDAGES/DRESSINGS) ×3 IMPLANT
CANISTER SUCT 3000ML PPV (MISCELLANEOUS) ×4 IMPLANT
CATH PALINDROME-P 19CM W/VT (CATHETERS) IMPLANT
CATH PALINDROME-P 23CM W/VT (CATHETERS) IMPLANT
CATH PALINDROME-P 28CM W/VT (CATHETERS) IMPLANT
CHLORAPREP W/TINT 26 (MISCELLANEOUS) ×4 IMPLANT
CLIP VESOCCLUDE MED 6/CT (CLIP) ×3 IMPLANT
CLIP VESOCCLUDE SM WIDE 6/CT (CLIP) ×3 IMPLANT
CONT SPEC 4OZ CLIKSEAL STRL BL (MISCELLANEOUS) ×1 IMPLANT
COVER PROBE W GEL 5X96 (DRAPES) ×3 IMPLANT
COVER SURGICAL LIGHT HANDLE (MISCELLANEOUS) ×3 IMPLANT
DERMABOND ADVANCED (GAUZE/BANDAGES/DRESSINGS) ×1
DERMABOND ADVANCED .7 DNX12 (GAUZE/BANDAGES/DRESSINGS) IMPLANT
DRAPE CHEST BREAST 15X10 FENES (DRAPES) ×3 IMPLANT
DRSG COVADERM 4X8 (GAUZE/BANDAGES/DRESSINGS) ×1 IMPLANT
ELECT REM PT RETURN 9FT ADLT (ELECTROSURGICAL) ×3
ELECTRODE REM PT RTRN 9FT ADLT (ELECTROSURGICAL) ×2 IMPLANT
GAUZE 4X4 16PLY ~~LOC~~+RFID DBL (SPONGE) ×3 IMPLANT
GAUZE SPONGE 4X4 12PLY STRL (GAUZE/BANDAGES/DRESSINGS) ×3 IMPLANT
GLOVE SURG POLYISO LF SZ8 (GLOVE) ×3 IMPLANT
GOWN STRL REUS W/ TWL LRG LVL3 (GOWN DISPOSABLE) ×4 IMPLANT
GOWN STRL REUS W/ TWL XL LVL3 (GOWN DISPOSABLE) ×2 IMPLANT
GOWN STRL REUS W/TWL LRG LVL3 (GOWN DISPOSABLE) ×6
GOWN STRL REUS W/TWL XL LVL3 (GOWN DISPOSABLE) ×3
INSERT FOGARTY SM (MISCELLANEOUS) ×2 IMPLANT
KIT BASIN OR (CUSTOM PROCEDURE TRAY) ×3 IMPLANT
KIT PALINDROME-P 55CM (CATHETERS) ×1 IMPLANT
KIT TURNOVER KIT B (KITS) ×3 IMPLANT
NDL 18GX1X1/2 (RX/OR ONLY) (NEEDLE) ×2 IMPLANT
NDL HYPO 25GX1X1/2 BEV (NEEDLE) ×2 IMPLANT
NEEDLE 18GX1X1/2 (RX/OR ONLY) (NEEDLE) IMPLANT
NEEDLE HYPO 25GX1X1/2 BEV (NEEDLE) IMPLANT
NS IRRIG 1000ML POUR BTL (IV SOLUTION) ×3 IMPLANT
PACK CV ACCESS (CUSTOM PROCEDURE TRAY) ×3 IMPLANT
PACK SURGICAL SETUP 50X90 (CUSTOM PROCEDURE TRAY) ×2 IMPLANT
PAD ARMBOARD 7.5X6 YLW CONV (MISCELLANEOUS) ×5 IMPLANT
SET MICROPUNCTURE 5F STIFF (MISCELLANEOUS) ×2 IMPLANT
SLING ARM FOAM STRAP LRG (SOFTGOODS) IMPLANT
SOAP 2 % CHG 4 OZ (WOUND CARE) ×2 IMPLANT
SPONGE INTESTINAL PEANUT (DISPOSABLE) ×3 IMPLANT
SPONGE T-LAP 18X18 ~~LOC~~+RFID (SPONGE) ×2 IMPLANT
SPONGE TONSIL 1.5 RFD TRIPLE (SPONGE) ×2 IMPLANT
STAPLER SKIN 35 WIDE (STAPLE) ×1 IMPLANT
STRIP CLOSURE SKIN 1/2X4 (GAUZE/BANDAGES/DRESSINGS) ×2 IMPLANT
SUT ETHILON 3 0 PS 1 (SUTURE) ×7 IMPLANT
SUT MNCRL AB 4-0 PS2 18 (SUTURE) ×3 IMPLANT
SUT PROLENE 4 0 RB 1 (SUTURE) ×3
SUT PROLENE 4 0 SH DA (SUTURE) ×1 IMPLANT
SUT PROLENE 4-0 RB1 .5 CRCL 36 (SUTURE) IMPLANT
SUT PROLENE 5 0 C 1 24 (SUTURE) ×3 IMPLANT
SUT PROLENE 6 0 BV (SUTURE) ×5 IMPLANT
SUT SILK 2 0 SH (SUTURE) ×2 IMPLANT
SUT SILK 3 0 SH CR/8 (SUTURE) ×1 IMPLANT
SUT VIC AB 2-0 CT1 27 (SUTURE) ×3
SUT VIC AB 2-0 CT1 36 (SUTURE) ×1 IMPLANT
SUT VIC AB 2-0 CT1 TAPERPNT 27 (SUTURE) IMPLANT
SUT VIC AB 3-0 SH 27 (SUTURE) ×3
SUT VIC AB 3-0 SH 27X BRD (SUTURE) ×4 IMPLANT
SYR 10ML LL (SYRINGE) ×2 IMPLANT
SYR 20ML LL LF (SYRINGE) ×4 IMPLANT
SYR 5ML LL (SYRINGE) ×2 IMPLANT
SYR CONTROL 10ML LL (SYRINGE) ×2 IMPLANT
TOWEL GREEN STERILE (TOWEL DISPOSABLE) ×3 IMPLANT
TOWEL GREEN STERILE FF (TOWEL DISPOSABLE) ×6 IMPLANT
TUBE CONNECTING 12X1/4 (SUCTIONS) ×1 IMPLANT
UNDERPAD 30X36 HEAVY ABSORB (UNDERPADS AND DIAPERS) ×2 IMPLANT
WATER STERILE IRR 1000ML POUR (IV SOLUTION) ×3 IMPLANT

## 2021-04-26 NOTE — Op Note (Signed)
DATE OF SERVICE: 04/26/2021  PATIENT:  [name]  45 y.o. male  PRE-OPERATIVE DIAGNOSIS:  ESRD, thrombosed right thigh graft  POST-OPERATIVE DIAGNOSIS:  Same, concern for right thigh graft infection  PROCEDURE:   1) re-exposure of right femoral vessels >1 month 2) thrombectomy common femoral vein 3) direct repair of right common femoral venotomy 4) direct repair of right common femoral arteriotomy 5) excision of right femoral arteriovenous loop graft 6) placement of right external iliac tunneled dialysis catheter (55cm)  SURGEON:  Surgeon(s) and Role:    * Cherre Robins, MD - Primary  ASSISTANT: Gerri Lins, PA-C  An assistant was required to facilitate exposure and expedite the case.  MODIFIER: a 22 modifier is appropriate in this instance given the life-threatening nature of intraoperative findings, the amount of work required, and the heavily scarred field.   ANESTHESIA:   general  EBL: 788mL  BLOOD ADMINISTERED:none  DRAINS: none   LOCAL MEDICATIONS USED:  NONE  SPECIMEN:  none  COUNTS: confirmed correct.  TOURNIQUET:  none  PATIENT DISPOSITION:  PACU - hemodynamically stable.   Delay start of Pharmacological VTE agent (>24hrs) due to surgical blood loss or risk of bleeding: no  INDICATION FOR PROCEDURE: [name] is a 45 y.o. male with ESRD dialyzing via right thigh arteriovenous graft. This thombosed earlier this week. Attempted percutaneous thrombectomy was not successful. After careful discussion of risks, benefits, and alternatives the patient was offered thrombectomy. The patient understood and wished to proceed.  OPERATIVE FINDINGS: The oblique incision in its subcutaneous tissue was heavily scarred making her exposure very difficult.  The venous anastomosis was disrupted from the vein and dehisced upon exposure.  After some difficulty, the venotomy was repaired directly.  I then skeletonized the common femoral artery and excised the  arterial anastomosis; directly repairing the arteriotomy.  The graft was poorly incorporated in the subcutaneous tissue, raising the question of infection.  I sent the entire graft for culture and gram stain.  Dialysis catheter was inserted under direct vision into the external iliac vein.  The patient has no good remaining options for hemodialysis access.  DESCRIPTION OF PROCEDURE: After identification of the patient in the pre-operative holding area, the patient was transferred to the operating room. The patient was positioned supine on the operating room table. Anesthesia was induced. The abdomen, groins, thighs were prepped and draped in standard fashion. A surgical pause was performed confirming correct patient, procedure, and operative location.  The oblique incision in the groin was reopened sharply.  Dense scar was encountered.  Bovie electrocautery was used to carve through the scar until 1 loop of the graft was identified.  This was traced back to the common femoral artery with great difficulty.  I then looked for the venous loop using duplex ultrasound.  I was able to identify it and follow-up back to its anastomosis.  The suture line at the anastomosis appeared disrupted.  It was difficult to see the anastomosis well because of the dense surrounding scar tissue.  I attempted to skeletonized the external iliac vein and distal femoral veins, but the graft became disrupted.  Thankfully the plug of thrombus left the venotomy hemostatic.  I continued my exposure, but made several inadvertent venotomies.  I clamped proximally and distally on the femoral veins.  I performed thrombectomy manually.  This resulted in torrential venous bleeding.  I suspect that the profunda femoris vein was not controlled in my exposure.  The dense scar made further exposure to  dangerous to attempt.  Used sponge sticks to obtain proximal and distal control on the venotomy and repaired it primarily using 2-0 silk suture.  The  other inadvertent venotomies were repaired similarly.  Given the disruption of the venous anastomosis, I became concerned that the graft was infected.  I skeletonized the arterial anastomosis.  This was thankfully a bit easier than the venous anastomosis.  I was able to slot a Cooley clamp across the arterial anastomosis.  The clamp was applied.  The graft was sharply excised, leaving no prosthetic material behind.  The arteriotomy was repaired primarily using 5-0 Prolene suture in 2 layers.  The clamp was released.  Hemostasis was achieved.  I pulled on the graft from the arterial anastomosis site.  The graft slid fairly easily most of the way out of the exposure.  The remainder the graft came with a brisk tug.  I passed this graft material off the field for culture and gram stain.  The right external iliac vein was directly punctured with an 18-gauge needle.  A central venogram was performed through the needle.  This showed patency from the external iliac vein to the IVC.  I threaded a J-wire into the IVC.  The needle was removed and over the wire a peel-away sheath was introduced into the IVC.  A 55 cm tunneled dialysis catheter was tunneled from the lateral thigh into our exposure.  The catheter was then delivered through the peel-away sheath.  The sheath was removed.  Fluoroscopy was used to confirm adequate position in the IVC near the sinocaval junction.  The catheter was secured in place.  The catheter was tested and found to flush and aspirate heparinized saline easily.  The catheter was heparin locked.  The right groin was closed in layers using 2-0 Vicryl, 3-0 nylon, and a surgical stapler.  Sterile bandages were applied.  Upon completion of the case instrument and sharps counts were confirmed correct. The patient was transferred to the PACU in good condition. I was present for all portions of the procedure.  Yevonne Aline. Stanford Breed, MD [facility] Phone  Number: [phone] 04/26/2021 6:28 PM

## 2021-04-26 NOTE — Anesthesia Procedure Notes (Signed)
Procedure Name: LMA Insertion Date/Time: 04/26/2021 3:24 PM Performed by: Rande Brunt, CRNA Pre-anesthesia Checklist: Patient identified, Emergency Drugs available, Suction available and Patient being monitored Patient Re-evaluated:Patient Re-evaluated prior to induction Oxygen Delivery Method: Circle System Utilized Preoxygenation: Pre-oxygenation with 100% oxygen Induction Type: IV induction Ventilation: Mask ventilation without difficulty LMA: LMA inserted LMA Size: 4.0 Number of attempts: 1 Airway Equipment and Method: Bite block Placement Confirmation: positive ETCO2 Tube secured with: Tape Dental Injury: Teeth and Oropharynx as per pre-operative assessment

## 2021-04-26 NOTE — Consult Note (Signed)
[facility] [place]  ASSESSMENT / PLAN: 45 y.o. male with thrombosed right thigh arteriovenous graft. Percutaneous attempt at thrombectomy unsuccessful. Plan open thrombectomy / venous anastomotic revision in OR today. If unable to get graft working will need to place tunneled dialysis catheter.   CHIEF COMPLAINT: thrombosed R thigh AVG  HISTORY OF PRESENT ILLNESS: [name] is a 45 y.o. male with extensive surgical history related to hemodialysis access. He has no other good options for dialysis beside his right thigh arteriovenous graft. This thrombosed on Thursday. He presented to [facility] today for a percutaneous thrombectomy, which was unsuccessful at crossing the venous anastomosis. He presents for open thrombectomy and venous anastomotic revision.  Past Medical History:  Diagnosis Date   Acute respiratory failure with hypoxia ([place]) 11/30/2018   Anemia    ESRD   ESRD on hemodialysis ([place]) 06/07/2011   TTS [place] [facility]. Started HD in 2006, got transplant in 2014 lasted until Dec 2019 then went back on HD.  Has L thigh AVG as of Jun 2020.  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    Hypertension    Morbid obesity ([place])    Paroxysmal atrial fibrillation ([place])    Sepsis ([place]) 11/30/2018   Sleep apnea     Past Surgical History:  Procedure Laterality Date   ARTERIOVENOUS GRAFT PLACEMENT  08/09/2010   Left Thigh Graft by Dr. [name]   AV FISTULA PLACEMENT Right 05/18/2018   Procedure: INSERTION OF ARTERIOVENOUS (AV) GORE-TEX GRAFT Left THIGH;  Surgeon: Waynetta Sandy, MD;  Location: [facility];  Service: Vascular;  Laterality: Right;   AV FISTULA PLACEMENT Right 02/15/2021   Procedure: INSERTION OF ARTERIOVENOUS (AV) GORE-TEX LOOP GRAFT RIGHT THIGH;  Surgeon: Waynetta Sandy, MD;  Location: [facility];  Service: Vascular;  Laterality: Right;   CAPD REMOVAL  05/08/2011   Procedure: CONTINUOUS AMBULATORY PERITONEAL DIALYSIS   (CAPD) CATHETER REMOVAL;  Surgeon: [name], MD;  Location: [facility];  Service: General;  Laterality: N/A;  Removal of CAPD catheter, Dr. requests to go after [place]  10/05/2010   Right Femoral Cath insertion by Dr. [name].  Pt ahas had several caths inserted.   INSERTION OF DIALYSIS CATHETER Right 02/15/2021   Procedure: Attempted INSERTION OF RIGHT and Left Internal Jugular DIALYSIS CATHETER, Insertion of Left Femoral Vein Dialysis Catheter;  Surgeon: Waynetta Sandy, MD;  Location: [facility];  Service: Vascular;  Laterality: Right;   IR FLUORO GUIDE CV LINE RIGHT  05/11/2018   IR US GUIDE VASC ACCESS RIGHT  05/11/2018   KIDNEY TRANSPLANT  2014   KNEE ARTHROSCOPY Left    UPPER EXTREMITY ANGIOGRAPHY Bilateral 05/13/2018   Procedure: UPPER EXTREMITY ANGIOGRAPHY - bilarteral;  Surgeon: Marty Heck, MD;  Location: [facility];  Service: Cardiovascular;  Laterality: Bilateral;    Family History  Problem Relation Age of Onset   Diabetes Father    Stroke Father    Stroke Maternal Grandmother    Anesthesia problems Neg Hx     Social History   Socioeconomic History   Marital status: Single    Spouse name: Not on file   Number of children: 0   Years of education: Not on file   Highest education level: Not on file  Occupational History   Not on file  Tobacco Use   Smoking status: Former    Types: Cigarettes    Quit date: 11/08/2015  Years since quitting: 5.4   Smokeless tobacco: Never   Tobacco comments:    Smokes a cigarette about every 3-4 days.  Vaping Use   Vaping Use: Never used  Substance and Sexual Activity   Alcohol use: Yes    Comment: Socially   Drug use: Yes    Frequency: 10.0 times per week    Types: Marijuana    Comment: 1-2 joints per day   Sexual activity: Yes  Other Topics Concern   Not on file  Social History Narrative   Lives alone.    Social Determinants of Health   Financial Resource  Strain: Not on file  Food Insecurity: Not on file  Transportation Needs: Not on file  Physical Activity: Not on file  Stress: Not on file  Social Connections: Not on file  Intimate Partner Violence: Not on file    No Known Allergies  Current Facility-Administered Medications  Medication Dose Route Frequency Provider Last Rate Last Admin   0.9 %  sodium chloride infusion   Intravenous Continuous Cherre Robins, MD       0.9 %  sodium chloride infusion   Intravenous Continuous Nolon Nations, MD 10 mL/hr at 04/26/21 1254 New Bag at 04/26/21 1254   ceFAZolin (ANCEF) 2-4 GM/100ML-% IVPB            ceFAZolin (ANCEF) IVPB 2g/100 mL premix  2 g Intravenous 30 min Pre-Op Cherre Robins, MD       chlorhexidine (HIBICLENS) 4 % liquid 4 application  60 mL Topical Once Cherre Robins, MD       And   [START ON 04/27/2021] chlorhexidine (HIBICLENS) 4 % liquid 4 application  60 mL Topical Once Cherre Robins, MD        REVIEW OF SYSTEMS:  [X]  denotes positive finding, [ ]  denotes negative finding Cardiac  Comments:  Chest pain or chest pressure:    Shortness of breath upon exertion:    Short of breath when lying flat:    Irregular heart rhythm:        Vascular    Pain in calf, thigh, or hip brought on by ambulation:    Pain in feet at night that wakes you up from your sleep:     Blood clot in your veins:    Leg swelling:         Pulmonary    Oxygen at home:    Productive cough:     Wheezing:         Neurologic    Sudden weakness in arms or legs:     Sudden numbness in arms or legs:     Sudden onset of difficulty speaking or slurred speech:    Temporary loss of vision in one eye:     Problems with dizziness:         Gastrointestinal    Blood in stool:     Vomited blood:         Genitourinary    Burning when urinating:     Blood in urine:        Psychiatric    Major depression:         Hematologic    Bleeding problems:    Problems with blood clotting too easily:         Skin    Rashes or ulcers:        Constitutional    Fever or chills:      PHYSICAL EXAM Vitals:   04/26/21  1223 04/26/21 1232  BP: (!) 124/104 132/89  Pulse: 75   Resp: 18   Temp: 97.9 F (36.6 C)   TempSrc: Oral   SpO2: 98%     Constitutional: chronically ill, appearing older than stated age. No distress. Appears well nourished.  Neurologic: CN intact. no focal findings. no sensory loss. Psychiatric:  Mood and affect symmetric and appropriate. Eyes:  No icterus. No conjunctival pallor. Ears, nose, throat:  mucous membranes moist. Midline trachea.  Cardiac: regular rate and rhythm.  Respiratory:  unlabored. Abdominal:  soft, non-tender, non-distended.  Peripheral vascular: R thigh graft thrombosed Extremity: No edema. No cyanosis. No pallor.  Lymphatic: No Stemmer's sign. No palpable lymphadenopathy.  PERTINENT LABORATORY AND RADIOLOGIC DATA  Most recent CBC CBC Latest Ref Rng & Units 04/26/2021 02/15/2021 02/06/2021  WBC 4.0 - 10.5 K/uL - - 6.7  Hemoglobin 13.0 - 17.0 g/dL 14.6 12.2(L) 13.1  Hematocrit 39.0 - 52.0 % 43.0 36.0(L) 39.7  Platelets 150.0 - 400.0 K/uL - - 192.0     Most recent CMP CMP Latest Ref Rng & Units 04/26/2021 02/15/2021 12/05/2020  Glucose 70 - 99 mg/dL 82 114(H) 93  BUN 6 - 20 mg/dL 86(H) 69(H) 41(H)  Creatinine 0.61 - 1.24 mg/dL >18.00(H) >18.00(H) 11.36(HH)  Sodium 135 - 145 mmol/L 137 136 138  Potassium 3.5 - 5.1 mmol/L 5.8(H) 5.2(H) 4.4  Chloride 98 - 111 mmol/L 98 104 93(L)  CO2 19 - 32 mEq/L - - 31  Calcium 8.4 - 10.5 mg/dL - - 9.3  Total Protein 6.0 - 8.3 g/dL - - 7.4  Total Bilirubin 0.2 - 1.2 mg/dL - - 0.5  Alkaline Phos 39 - 117 U/L - - 38(L)  AST 0 - 37 U/L - - 11  ALT 0 - 53 U/L - - 10    Renal function CrCl cannot be calculated (This lab value cannot be used to calculate CrCl because it is not a number: >18.00).  Hgb A1c MFr Bld (%)  Date Value  12/05/2020 6.5    LDL Cholesterol  Date Value Ref Range Status   12/05/2020 146 (H) 0 - 99 mg/dL Final     Yevonne Aline. Stanford Breed, MD [facility] Phone Number: [phone] 04/26/2021 2:44 PM  Total time spent on preparing this encounter including chart review, data review, collecting history, examining the patient, coordinating care for this established patient, 40 minutes.  Portions of this report may have been transcribed using voice recognition software.  Every effort has been made to ensure accuracy; however, inadvertent computerized transcription errors may still be present.

## 2021-04-26 NOTE — Anesthesia Postprocedure Evaluation (Signed)
Anesthesia Post Note  Patient: [name]  Procedure(s) Performed: INSERTION OF TUNNELED 55cm PALIDROME PRECISION CHRONIC DIALYSIS CATHETER (Right: Groin) REMOVAL OF RIGHT THIGH ARTERIOVENOUS GORE-TEX GRAFT AND REPAIR OF RIGHT COMMON FEMORAL ARTERY (Right: Leg Upper)     Patient location during evaluation: PACU Anesthesia Type: General Level of consciousness: awake Pain management: pain level controlled Vital Signs Assessment: post-procedure vital signs reviewed and stable Respiratory status: spontaneous breathing, nonlabored ventilation, respiratory function stable and patient connected to nasal cannula oxygen Cardiovascular status: blood pressure returned to baseline and stable Postop Assessment: no apparent nausea or vomiting Anesthetic complications: no   No notable events documented.  Last Vitals:  Vitals:   04/26/21 2000 04/26/21 2032  BP:  133/89  Pulse: (!) 57 (!) 55  Resp: 11 11  Temp:    SpO2: 100% 100%    Last Pain:  Vitals:   04/26/21 1940  TempSrc:   PainSc: 3                  Brindley Madarang P Emilija Bohman

## 2021-04-26 NOTE — Progress Notes (Signed)
Report from [place], [place]. Admitted to room 4e15 from PACU. Upon completing assessment, right groin site noted to have bled through dressing. When dressing removed, observed bleeding was coming from loosened staple. Pressure dressing applied x 2 due to saturation. Dr. Stanford Breed on call and alerted. Advised to keep dressing in place/reinforce if necessary and monitor bleeding.  On admission, patient agitated. Refusing to answer any questions or participate in care. Stated that "why should I, it does not even matter:" Patient encouraged to verbalize his feelings. Again, asking "why because there ain't nothing anybody can do about it. Plan of care discussed with patient and patient verbalizes understanding

## 2021-04-26 NOTE — Anesthesia Preprocedure Evaluation (Addendum)
Anesthesia Evaluation    Reviewed: Allergy & Precautions, Patient's Chart, lab work & pertinent test results, Unable to perform ROS - Chart review only  Airway Mallampati: III  TM Distance: >3 FB Neck ROM: Full   Comment: Thick beard Dental  (+) Dental Advisory Given, Teeth Intact   Pulmonary sleep apnea (does not use CPAP) , former smoker,    Pulmonary exam normal breath sounds clear to auscultation       Cardiovascular hypertension, +CHF  Normal cardiovascular exam+ dysrhythmias Atrial Fibrillation  Rhythm:Regular Rate:Normal  Echo 2020 1. The left ventricle has normal systolic function, with an ejection  fraction of 55-60%. The cavity size was normal. There is moderately  increased left ventricular wall thickness. Left ventricular diastolic  Doppler parameters are consistent with  impaired relaxation. No evidence of left ventricular regional wall motion  abnormalities. D-shaped interventricular septum suggestive of RV  pressure/volume overload.  2. Right atrial size was mildly dilated.  3. Trivial pericardial effusion is present.  4. The aortic valve is tricuspid. Mild calcification of the aortic valve.  No stenosis of the aortic valve.  5. The aortic root is normal in size and structure.  6. There is mild dilatation of the ascending aorta measuring 42 mm.  7. The right ventricle has severely reduced systolic function. The cavity  was moderately enlarged. There is no increase in right ventricular wall  thickness.  8. The inferior vena cava was dilated in size with <50% respiratory  variability. PA systolic pressure 45 mmHg.  9. No evidence of mitral valve stenosis. No significant mitral  regurgitation.    Neuro/Psych negative neurological ROS  negative psych ROS   GI/Hepatic negative GI ROS, (+)     substance abuse  marijuana use,   Endo/Other  diabetes, Well Controlled, Type 2Morbid obesityBMI 40 a1c 6.5   Renal/GU ESRF and DialysisRenal diseaseHD TTS K 5.2 this AM Last HD 6d ago through R fem HD cath  negative genitourinary   Musculoskeletal negative musculoskeletal ROS (+)   Abdominal (+) + obese,   Peds negative pediatric ROS (+)  Hematology  (+) Blood dyscrasia, anemia , hct 36, plt 192   Anesthesia Other Findings   Reproductive/Obstetrics negative OB ROS                            Anesthesia Physical  Anesthesia Plan  ASA: 3  Anesthesia Plan: General   Post-op Pain Management: Minimal or no pain anticipated   Induction:   PONV Risk Score and Plan: 2 and Ondansetron, Treatment may vary due to age or medical condition, Midazolam and Dexamethasone  Airway Management Planned: LMA  Additional Equipment: None  Intra-op Plan:   Post-operative Plan: Extubation in OR  Informed Consent: I have reviewed the patients History and Physical, chart, labs and discussed the procedure including the risks, benefits and alternatives for the proposed anesthesia with the patient or authorized representative who has indicated his/her understanding and acceptance.     Dental advisory given  Plan Discussed with: CRNA  Anesthesia Plan Comments:        Anesthesia Quick Evaluation

## 2021-04-26 NOTE — Anesthesia Procedure Notes (Signed)
Procedure Name: Intubation Date/Time: 04/26/2021 5:33 PM Performed by: Rande Brunt, CRNA Pre-anesthesia Checklist: Patient identified, Emergency Drugs available, Suction available and Patient being monitored Patient Re-evaluated:Patient Re-evaluated prior to induction Oxygen Delivery Method: Circle System Utilized Preoxygenation: Pre-oxygenation with 100% oxygen Induction Type: IV induction Ventilation: Mask ventilation without difficulty Laryngoscope Size: Glidescope and 3 Grade View: Grade II Tube type: Oral Tube size: 7.5 mm Number of attempts: 1 Airway Equipment and Method: Stylet and Oral airway Placement Confirmation: ETT inserted through vocal cords under direct vision, positive ETCO2 and breath sounds checked- equal and bilateral Secured at: 25 cm Tube secured with: Tape Dental Injury: Teeth and Oropharynx as per pre-operative assessment  Comments: Dr. Lissa Hoard placed ETT tube

## 2021-04-26 NOTE — Progress Notes (Signed)
Pacu RN Report to floor given  Gave report to Diava RN. [phone]. Discussed surgery, meds given in OR and Pacu, VS, IV fluids given, EBL, urine output, pain and other pertinent information. Also discussed if pt had any family or friends here or belongings with them.   Discussed VS, meds given, new HD R groin placed.    Pt exits my care.

## 2021-04-26 NOTE — Progress Notes (Signed)
Abnormal labs in short stay: Creatinine > 18.00; K 5.8. Dr. Lissa Hoard notified.

## 2021-04-26 NOTE — Transfer of Care (Signed)
Immediate Anesthesia Transfer of Care Note  Patient: [name]  Procedure(s) Performed: INSERTION OF TUNNELED 55cm PALIDROME PRECISION CHRONIC DIALYSIS CATHETER (Right: Groin) REMOVAL OF RIGHT THIGH ARTERIOVENOUS GORE-TEX GRAFT AND REPAIR OF RIGHT COMMON FEMORAL ARTERY (Right: Leg Upper)  Patient Location: PACU  Anesthesia Type:General  Level of Consciousness: drowsy, patient cooperative and responds to stimulation  Airway & Oxygen Therapy: Patient Spontanous Breathing and Patient connected to face mask oxygen  Post-op Assessment: Report given to RN, Post -op Vital signs reviewed and stable and Patient moving all extremities X 4  Post vital signs: Reviewed and stable  Last Vitals:  Vitals Value Taken Time  BP 110/75 04/26/21 1846  Temp    Pulse 62 04/26/21 1851  Resp 19 04/26/21 1851  SpO2 98 % 04/26/21 1851  Vitals shown include unvalidated device data.  Last Pain:  Vitals:   04/26/21 1223  TempSrc: Oral  PainSc: 0-No pain         Complications: No notable events documented.

## 2021-04-27 ENCOUNTER — Encounter (HOSPITAL_COMMUNITY): Payer: Self-pay | Admitting: Vascular Surgery

## 2021-04-27 DIAGNOSIS — Z9889 Other specified postprocedural states: Secondary | ICD-10-CM

## 2021-04-27 LAB — RESP PANEL BY RT-PCR (FLU A&B, COVID) ARPGX2
Influenza A by PCR: NEGATIVE
Influenza B by PCR: NEGATIVE
SARS Coronavirus 2 by RT PCR: NEGATIVE

## 2021-04-27 LAB — HEPATITIS B SURFACE ANTIGEN: Hepatitis B Surface Ag: NONREACTIVE

## 2021-04-27 LAB — HEPATITIS B SURFACE ANTIBODY,QUALITATIVE: Hep B S Ab: REACTIVE — AB

## 2021-04-27 MED ORDER — LIDOCAINE-PRILOCAINE 2.5-2.5 % EX CREA: 1.0000 "application " | TOPICAL_CREAM | CUTANEOUS | Status: DC | PRN

## 2021-04-27 MED ORDER — CHLORHEXIDINE GLUCONATE CLOTH 2 % EX PADS
6.0000 | MEDICATED_PAD | Freq: Every day | CUTANEOUS | Status: DC
  Administered 2021-04-27: 6 via TOPICAL

## 2021-04-27 MED ORDER — VANCOMYCIN HCL 2000 MG/400ML IV SOLN
2000.0000 mg | INTRAVENOUS | Status: AC
  Administered 2021-04-27: 2000 mg via INTRAVENOUS
  Filled 2021-04-27: qty 400

## 2021-04-27 MED ORDER — OXYCODONE-ACETAMINOPHEN 5-325 MG PO TABS: 1.0000 | ORAL_TABLET | Freq: Four times a day (QID) | ORAL | 0 refills | Status: DC | PRN

## 2021-04-27 MED ORDER — VANCOMYCIN HCL IN DEXTROSE 1-5 GM/200ML-% IV SOLN: 1000.0000 mg | INTRAVENOUS | Status: DC

## 2021-04-27 MED ORDER — HEPARIN SODIUM (PORCINE) 1000 UNIT/ML DIALYSIS
1000.0000 [IU] | INTRAMUSCULAR | Status: DC | PRN
  Administered 2021-04-27: 1000 [IU] via INTRAVENOUS_CENTRAL
  Filled 2021-04-27: qty 1

## 2021-04-27 MED ORDER — LIDOCAINE HCL (PF) 1 % IJ SOLN: 5.0000 mL | INTRAMUSCULAR | Status: DC | PRN

## 2021-04-27 MED ORDER — SODIUM CHLORIDE 0.9 % IV SOLN: 100.0000 mL | INTRAVENOUS | Status: DC | PRN

## 2021-04-27 MED ORDER — PENTAFLUOROPROP-TETRAFLUOROETH EX AERO: 1.0000 "application " | INHALATION_SPRAY | CUTANEOUS | Status: DC | PRN

## 2021-04-27 MED ORDER — ALTEPLASE 2 MG IJ SOLR: 2.0000 mg | Freq: Once | INTRAMUSCULAR | Status: DC | PRN

## 2021-04-27 NOTE — Progress Notes (Signed)
Pharmacy Antibiotic Note  [name] is a 45 y.o. male admitted on 04/26/2021 with  infected AV graft .  Pharmacy has been consulted for vancomycin dosing.  Scheduled for percutaneous thrombectomy as outpatient yesterday that was unsuccessful for thrombectomy so now s/p open thrombectomy 11/18. Concern for AVG infection. WBC 8.3, afeb. Undergoing HD now - on TTS schedule.   Plan: Vancomycin 2g IV now once then 1g IV qHD Plan for 2 weeks of therapy - will order levels prior to 3rd maintenance session of HD if in hospital Monitor HD schedule, cx results, clinical pic, and vanc levels as appropriate  Weight: (!) 143.2 kg (315 lb 11.2 oz)  Temp (24hrs), Avg:97.9 F (36.6 C), Min:97.6 F (36.4 C), Max:98.3 F (36.8 C)  Recent Labs  Lab 04/26/21 1211 04/26/21 1930  WBC  --  8.3  CREATININE >18.00* 18.00*    Estimated Creatinine Clearance: 7.7 mL/min (A) (by C-G formula based on SCr of 18 mg/dL (H)).    No Known Allergies  Antimicrobials this admission: Vancomycin 11/19 >> [12/3] Cefazolin 11/18 x1  Dose adjustments this admission: N/A  Microbiology results: 11/18 Tissue Cx: ngtd 11/19 COVID/Flu PCR: neg  11/18 MRSA PCR: neg  Thank you for allowing pharmacy to be a part of this patient's care.  Antonietta Jewel, PharmD, [place] Clinical Pharmacist  Phone: [phone] 04/27/2021 3:25 PM  Please check AMION for all [place] phone numbers After 10:00 PM, call [place] [phone]

## 2021-04-27 NOTE — Procedures (Signed)
   I was present at this dialysis session, have reviewed the session itself and made  appropriate changes Kelly Splinter MD [place] pager [phone]   04/27/2021, 3:15 PM

## 2021-04-27 NOTE — Consult Note (Signed)
Renal Service Consult Note [facility]  Rainer Mounce [facility] 04/27/2021 Sol Blazing, MD Requesting Physician: Dr. Stanford Breed  Reason for Consult: ESRD pt w/ clotted thigh graft HPI: The patient is a 45 y.o. year-old w/ hx of ESRD on HD (started 2006), failed renal transplant (2014 > 2019), PAF, sepsis, OSA, HTN who presented for clotted thigh AVG to [facility] (outpatient, [facility]) 11/18 for percutaneous thrombectomy. They were not successful at thrombectomy so pt was sent to hospital. Per [facility]VS pt has an extensive surgical history related to HD access. [facility]VS took pt to OR for open thrombectomy yesterday 11/18. The AVG could not be salvaged and required AVG resection and placement of a R thigh TDC.  The AVG "slippe out" and the surgeon's are concerned the AVG may be infected. The excised graft was sent to the lab for culture, and IV Ancef was given 2 gm. We are asked to see the patient for HD. Pt's usual HD is TTS, did not get HD on Thursday. Creat 18.  Pt w/o complaints, wouldn't agree to HD initially, then was accepting of 3 hr session today.  He denies any LE swelling or SOB or orthopnea.  No recent fevers.    ROS - denies CP, no joint pain, no HA, no blurry vision, no rash, no diarrhea, no nausea/ vomiting   Past Medical History  Past Medical History:  Diagnosis Date   Acute respiratory failure with hypoxia ([place]) 11/30/2018   Anemia    ESRD   ESRD on hemodialysis ([place]) 06/07/2011   TTS [place]. Started HD in 2006, got transplant in 2014 lasted until Dec 2019 then went back on HD.  Has L thigh AVG as of Jun 2020.  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    Hypertension    Morbid obesity ([place])    Paroxysmal atrial fibrillation ([place])    Sepsis ([place]) 11/30/2018   Sleep apnea    Past Surgical History  Past Surgical History:  Procedure Laterality Date   ARTERIOVENOUS GRAFT PLACEMENT  08/09/2010   Left Thigh Graft by Dr. [name]   AV FISTULA PLACEMENT Right  05/18/2018   Procedure: INSERTION OF ARTERIOVENOUS (AV) GORE-TEX GRAFT Left THIGH;  Surgeon: Waynetta Sandy, MD;  Location: [place];  Service: Vascular;  Laterality: Right;   AV FISTULA PLACEMENT Right 02/15/2021   Procedure: INSERTION OF ARTERIOVENOUS (AV) GORE-TEX LOOP GRAFT RIGHT THIGH;  Surgeon: Waynetta Sandy, MD;  Location: [place];  Service: Vascular;  Laterality: Right;   CAPD REMOVAL  05/08/2011   Procedure: CONTINUOUS AMBULATORY PERITONEAL DIALYSIS  (CAPD) CATHETER REMOVAL;  Surgeon: [name], MD;  Location: [place];  Service: General;  Laterality: N/A;  Removal of CAPD catheter, Dr. requests to go after [place]  10/05/2010   Right Femoral Cath insertion by Dr. [name].  Pt ahas had several caths inserted.   INSERTION OF DIALYSIS CATHETER Right 02/15/2021   Procedure: Attempted INSERTION OF RIGHT and Left Internal Jugular DIALYSIS CATHETER, Insertion of Left Femoral Vein Dialysis Catheter;  Surgeon: Waynetta Sandy, MD;  Location: [place];  Service: Vascular;  Laterality: Right;   INSERTION OF DIALYSIS CATHETER Right 04/26/2021   Procedure: INSERTION OF TUNNELED 55cm PALIDROME PRECISION CHRONIC DIALYSIS CATHETER;  Surgeon: Cherre Robins, MD;  [facility];  Service: Vascular;  Laterality: Right;   IR FLUORO GUIDE CV LINE RIGHT  05/11/2018   IR US GUIDE VASC ACCESS RIGHT  05/11/2018   KIDNEY TRANSPLANT  2014   KNEE ARTHROSCOPY Left    THROMBECTOMY W/ EMBOLECTOMY Right 04/26/2021   Procedure: REMOVAL OF RIGHT THIGH ARTERIOVENOUS GORE-TEX GRAFT AND REPAIR OF RIGHT COMMON FEMORAL ARTERY;  Surgeon: Cherre Robins, MD;  Location: [place];  Service: Vascular;  Laterality: Right;   UPPER EXTREMITY ANGIOGRAPHY Bilateral 05/13/2018   Procedure: UPPER EXTREMITY ANGIOGRAPHY - bilarteral;  Surgeon: Marty Heck, MD;  Location: [place] [facility];  Service: Cardiovascular;  Laterality: Bilateral;   Family History  Family  History  Problem Relation Age of Onset   Diabetes Father    Stroke Father    Stroke Maternal Grandmother    Anesthesia problems Neg Hx    Social History  reports that he quit smoking about 5 years ago. His smoking use included cigarettes. He has never used smokeless tobacco. He reports current alcohol use. He reports current drug use. Frequency: 10.00 times per week. Drug: Marijuana. Allergies No Known Allergies Home medications Prior to Admission medications   Medication Sig Start Date End Date Taking? Authorizing Provider  atorvastatin (LIPITOR) 40 MG tablet Take 1 tablet (40 mg total) by mouth daily. 02/06/21  Yes Haydee Salter, MD  ferric citrate (AURYXIA) 1 GM 210 MG(Fe) tablet Take 630 mg by mouth 3 (three) times daily with meals. 09/06/19  Yes [provider]  doxycycline (VIBRA-TABS) 100 MG tablet Take 1 tablet (100 mg total) by mouth 2 (two) times daily. Patient not taking: Reported on 04/27/2021 02/14/21   Persons, Bevely Palmer, [place]  oxyCODONE-acetaminophen (PERCOCET) 5-325 MG tablet Take 1 tablet by mouth every 6 (six) hours as needed. Patient not taking: Reported on 04/27/2021 02/16/21   Gabriel Earing, PA-C  oxyCODONE-acetaminophen (PERCOCET/ROXICET) 5-325 MG tablet Take 1 tablet by mouth every 6 (six) hours as needed for moderate pain. 04/27/21   Ulyses Amor, PA-C     Vitals:   04/26/21 2316 04/27/21 0327 04/27/21 0815 04/27/21 1149  BP: 129/87 127/89 (!) 133/99 128/90  Pulse: 84 67 74 92  Resp: 12 12 13 16   Temp: 98.3 F (36.8 C) 97.9 F (36.6 C) 97.8 F (36.6 C) 97.6 F (36.4 C)  TempSrc: Oral Oral Oral Oral  SpO2: 94% 100% 100% 100%  Weight:  (!) 143.2 kg     Exam Gen alert, no distress No rash, cyanosis or gangrene Sclera anicteric, throat clear  No jvd or bruits Chest clear bilat to bases, no rales/ wheezing RRR no MRG Abd soft ntnd no mass or ascites +bs GU normal MS no joint effusions or deformity Ext no LE or UE edema, no wounds or  ulcers Neuro is alert, Ox 3 , nf  R thigh TDC accessed presently     Home meds include - lipitor, curyxia, doxycycline, percocet prn     OP HD: AF TTS  4.5h  500/500    132.5kg  2/2 bath  R thigh TDC/ (thigh AVG resected 11/19) hep 11,400u bolus  - parsabiv 5  - mircera 75 q2, last 11/10  - auryxia 3 ac tid   Assessment/ Plan: Clotted AVG - pt w/ extensive surgical access history, does not have other sites per [facility]VS notes. Clotted AVG could not be declotted in OP setting per [facility]. As inpatient also the AVG could not be salvaged and was resected yesterday w/ suspicion of infection.  Graft was sent to labs for culture and IV Ancef 2 gm was given. A TDC was placed in the R thigh position  in OR as well yesterday by [facility].  Pt would only agree to a shortened HD today. HD is underway. No fever/ ^WBC here. Will plan empiric IV vanc today post HD and as outpt until cx's are back.   ESRD - on HD TTS, since 2006 (had transplant 2014- 2019). Missed last HD. Good compliance.  Volume - no gross edema on exam, 3 L UF goal today.  HTN - not on any BP lowering meds MBD ckd - cont binder      Rob Slayter Moorhouse  MD 04/27/2021, 2:55 PM  Recent Labs  Lab 04/26/21 1211 04/26/21 1930  WBC  --  8.3  HGB 14.6 11.6*   Recent Labs  Lab 04/26/21 1211 04/26/21 1930  K 5.8*  --   BUN 86*  --   CREATININE >18.00* 18.00*

## 2021-04-27 NOTE — Progress Notes (Signed)
Pt came back from dialysis. VSS.  Lavenia Atlas, RN

## 2021-04-27 NOTE — Progress Notes (Addendum)
[facility] [place]  Subjective  - Wants to go home   Objective (!) 133/99 74 97.8 F (36.6 C) (Oral) 13 100%  Intake/Output Summary (Last 24 hours) at 04/27/2021 1008 Last data filed at 04/26/2021 1852 Gross per 24 hour  Intake 850 ml  Output 1350 ml  Net -500 ml    Right groin with SS drainage dressing changed.  No hematoma. Femoral catheter in place Lungs non labored breathing Heart RRR  Assessment/Planning: POD # 1 PROCEDURE:   1) re-exposure of right femoral vessels >1 month 2) thrombectomy common femoral vein 3) direct repair of right common femoral venotomy 4) direct repair of right common femoral arteriotomy 5) excision of right femoral arteriovenous loop graft 6) placement of right external iliac tunneled dialysis catheter (55cm)  Plan will be for discharge today He will receive HD via catheter today.  He will be discharged with dressing supplies for home.  He states he can mange this on his own. F/U in 1 week for incision check He has no other HD access options.   Roxy Horseman 04/27/2021 10:08 AM --  Laboratory Lab Results: Recent Labs    04/26/21 1211 04/26/21 1930  WBC  --  8.3  HGB 14.6 11.6*  HCT 43.0 35.3*  PLT  --  190   BMET Recent Labs    04/26/21 1211 04/26/21 1930  NA 137  --   K 5.8*  --   CL 98  --   GLUCOSE 82  --   BUN 86*  --   CREATININE >18.00* 18.00*    COAG Lab Results  Component Value Date   INR 1.5 (H) 11/30/2018   INR 1.21 05/11/2018   INR 1.16 05/10/2018   No results found for: PTT  VASCULAR STAFF ADDENDUM: I have independently interviewed and examined the patient. I agree with the above.  Reviewed operative findings. No good options remain for patient. Will be dependent on [facility] going forward. Follow up in 2 weeks for wound check.  Yevonne Aline. Stanford Breed, MD [facility] Phone Number: [phone] 04/27/2021 10:54 AM

## 2021-04-28 ENCOUNTER — Telehealth: Payer: Self-pay | Admitting: Nephrology

## 2021-04-28 NOTE — Telephone Encounter (Signed)
Transition of Care Contact from [place]   Date of Discharge: 04/27/2021 Date of Contact: 04/28/21 Method of contact: phone Talked to patient   Patient contacted to discuss transition of care form recent hospitaliztion. Patient was admitted to [facility] from 11/18 to 11/19 with the discharge diagnosis of Thrombosed AVG with possible infection s/p removal and TDC placement.     Medication changes were reviewed.  Patient will follow up with is outpatient dialysis center 04/30/21.  Other follow up needs include none identified   Jen Mow, PA-C [facility] Pager: [phone]

## 2021-04-29 LAB — HEPATITIS B SURFACE ANTIBODY, QUANTITATIVE: Hep B S AB Quant (Post): 60.8 m[IU]/mL (ref >9.9)

## 2021-05-01 NOTE — Discharge Summary (Signed)
[facility] Discharge Summary   Patient ID:  [name] MRN: [id number] DOB/AGE: [birth year] 45 y.o.  Admit date: 04/26/2021 Discharge date: 04/27/21 Date of Surgery: 04/26/2021 Surgeon: Surgeon(s): Cherre Robins, MD  Admission Diagnosis: ESRD (end stage renal disease) [facility]) [N18.6]  Discharge Diagnoses:  ESRD (end stage renal disease) ([place]) [N18.6]  Secondary Diagnoses: Past Medical History:  Diagnosis Date   Acute respiratory failure with hypoxia ([place]) 11/30/2018   Anemia    ESRD   ESRD on hemodialysis ([place]) 06/07/2011   TTS [place] [facility]. Started HD in 2006, got transplant in 2014 lasted until Dec 2019 then went back on HD.  Has L thigh AVG as of Jun 2020.  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    Hypertension    Morbid obesity ([place])    Paroxysmal atrial fibrillation ([facility])    Sepsis ([place]) 11/30/2018   Sleep apnea     Procedure(s): INSERTION OF TUNNELED 55cm PALIDROME PRECISION CHRONIC DIALYSIS CATHETER REMOVAL OF RIGHT THIGH ARTERIOVENOUS GORE-TEX GRAFT AND REPAIR OF RIGHT COMMON FEMORAL ARTERY  Discharged Condition: stable  HPI: 45 y.o. male with thrombosed right thigh arteriovenous graft. Percutaneous attempt at thrombectomy unsuccessful.  Plan for open surgical thrombectoy verse femoral HD catheter placement.   Hospital Course:  IZIK BINGMAN is a 45 y.o. male is S/P Right Procedure(s): INSERTION OF TUNNELED 55cm PALIDROME PRECISION CHRONIC DIALYSIS CATHETER REMOVAL OF RIGHT THIGH ARTERIOVENOUS GORE-TEX GRAFT AND REPAIR OF RIGHT COMMON FEMORAL ARTERY He had an uneventful stay over night.  Nephrology was consult for HD.  Post HD the patient requested he be discharged home.  He was instructed to do dry dressing change QD/PRN to the right groin until there is no drainage.  He was afebrile and pain was well controlled prior to discharge.  Consults:  Treatment Team:  Roney Jaffe, MD  Significant Diagnostic  Studies: CBC Lab Results  Component Value Date   WBC 8.3 04/26/2021   HGB 11.6 (L) 04/26/2021   HCT 35.3 (L) 04/26/2021   MCV 98.1 04/26/2021   PLT 190 04/26/2021    BMET    Component Value Date/Time   NA 137 04/26/2021 1211   NA 137 05/26/2018 0000   K 5.8 (H) 04/26/2021 1211   CL 98 04/26/2021 1211   CO2 31 12/05/2020 1021   GLUCOSE 82 04/26/2021 1211   BUN 86 (H) 04/26/2021 1211   CREATININE 18.00 (H) 04/26/2021 1930   CALCIUM 9.3 12/05/2020 1021   GFRNONAA 3 (L) 04/26/2021 1930   GFRAA 5 (L) 12/16/2018 1350   COAG Lab Results  Component Value Date   INR 1.5 (H) 11/30/2018   INR 1.21 05/11/2018   INR 1.16 05/10/2018     Disposition:  Discharge to :Home Discharge Instructions     Call MD for:  redness, tenderness, or signs of infection (pain, swelling, bleeding, redness, odor or green/yellow discharge around incision site)   Complete by: As directed    Call MD for:  severe or increased pain, loss or decreased feeling  in affected limb(s)   Complete by: As directed    Call MD for:  temperature >100.5   Complete by: As directed    Resume previous diet   Complete by: As directed       Allergies as of 04/27/2021   No Known Allergies      Medication List     TAKE these medications    atorvastatin 40 MG tablet Commonly known as: LIPITOR Take  1 tablet (40 mg total) by mouth daily.   doxycycline 100 MG tablet Commonly known as: VIBRA-TABS Take 1 tablet (100 mg total) by mouth 2 (two) times daily.   ferric citrate 1 GM 210 MG(Fe) tablet Commonly known as: AURYXIA Take 630 mg by mouth 3 (three) times daily with meals.   oxyCODONE-acetaminophen 5-325 MG tablet Commonly known as: Percocet Take 1 tablet by mouth every 6 (six) hours as needed. What changed: Another medication with the same name was added. Make sure you understand how and when to take each.   oxyCODONE-acetaminophen 5-325 MG tablet Commonly known as: PERCOCET/ROXICET Take 1 tablet by  mouth every 6 (six) hours as needed for moderate pain. What changed: You were already taking a medication with the same name, and this prescription was added. Make sure you understand how and when to take each.       Verbal and written Discharge instructions given to the patient. Wound care per Discharge AVS  Follow-up Information     Cherre Robins, MD Follow up in 1 week(s).   Specialties: Vascular Surgery, Interventional Cardiology Why: Office will call you to arrange your appt (sent) Contact information: [address] [place] [address] [phone]                 Signed: Roxy Horseman 05/01/2021, 10:14 AM

## 2021-05-03 LAB — AEROBIC/ANAEROBIC CULTURE W GRAM STAIN (SURGICAL/DEEP WOUND): Gram Stain: NONE SEEN

## 2021-05-07 ENCOUNTER — Ambulatory Visit (INDEPENDENT_AMBULATORY_CARE_PROVIDER_SITE_OTHER): Payer: BC Managed Care – PPO | Admitting: Physician Assistant

## 2021-05-07 ENCOUNTER — Other Ambulatory Visit: Payer: Self-pay

## 2021-05-07 VITALS — BP 115/75 | HR 92 | Temp 98.6°F | Resp 20 | Ht 74.0 in | Wt 315.8 lb

## 2021-05-07 DIAGNOSIS — Z992 Dependence on renal dialysis: Secondary | ICD-10-CM

## 2021-05-07 DIAGNOSIS — N186 End stage renal disease: Secondary | ICD-10-CM

## 2021-05-07 NOTE — Progress Notes (Signed)
  POST OPERATIVE OFFICE NOTE    CC:  F/u for surgery  HPI:  This is a 45 y.o. male who is s/p  1) re-exposure of right femoral vessels >1 month 2) thrombectomy common femoral vein 3) direct repair of right common femoral venotomy 4) direct repair of right common femoral arteriotomy 5) excision of right femoral arteriovenous loop graft 6) placement of right external iliac tunneled dialysis catheter (55cm)  on 04/26/2021 by Dr. Stanford Breed.    Pt returns today for follow up.  Pt states his incision is doing well.  Denies fever.  Says he has been getting abx at HD.   He has not had any drainage from his incision.  He states that his [facility] is working well.    The pt is on dialysis TTS at [facility] location.   No Known Allergies  Current Outpatient Medications  Medication Sig Dispense Refill   atorvastatin (LIPITOR) 40 MG tablet Take 1 tablet (40 mg total) by mouth daily. 90 tablet 3   doxycycline (VIBRA-TABS) 100 MG tablet Take 1 tablet (100 mg total) by mouth 2 (two) times daily. 30 tablet 0   ferric citrate (AURYXIA) 1 GM 210 MG(Fe) tablet Take 630 mg by mouth 3 (three) times daily with meals.     heparin 1000 unit/mL SOLN injection Heparin Sodium (Porcine) 1,000 Units/mL Catheter Lock Arterial     oxyCODONE-acetaminophen (PERCOCET) 5-325 MG tablet Take 1 tablet by mouth every 6 (six) hours as needed. 20 tablet 0   oxyCODONE-acetaminophen (PERCOCET/ROXICET) 5-325 MG tablet Take 1 tablet by mouth every 6 (six) hours as needed for moderate pain. 30 tablet 0   No current facility-administered medications for this visit.     ROS:  See HPI  Physical Exam:  Today's Vitals   05/07/21 1331  BP: 115/75  Pulse: 92  Resp: 20  Temp: 98.6 F (37 C)  TempSrc: Temporal  SpO2: 96%  Weight: (!) 315 lb 12.8 oz (143.2 kg)  Height: 6\' 2"  (1.88 m)   Body mass index is 40.55 kg/m.   Incision:  healed nicely with staples and nylon sutures in place Extremities:   Right DP pulse is palpable.     Assessment/Plan:  This is a 45 y.o. male who is s/p: 1) re-exposure of right femoral vessels >1 month 2) thrombectomy common femoral vein 3) direct repair of right common femoral venotomy 4) direct repair of right common femoral arteriotomy 5) excision of right femoral arteriovenous loop graft 6) placement of right external iliac tunneled dialysis catheter (55cm)  on 04/26/2021 by Dr. Stanford Breed.   -the pt incision has healed nicely and his staples and sutures are removed today and steri strips placed.  -the graft was sent to microbiology when removed and grew rare staph lugdunensis - pt states he has been receiving antibiotics at dialysis.   -pt does not have any good options for new dialysis access.  Discussed with Dr. Stanford Breed and will refer pt to [facility] to see if they have any options to offer the pt.  Pt may be catheter dependent in the future.  -pt will continue meticulous incisional care. -the pt will follow up with [facility] as needed.    Leontine Locket, [facility] [phone]  Clinic MD:  Stanford Breed

## 2021-06-11 DIAGNOSIS — N186 End stage renal disease: Secondary | ICD-10-CM | POA: Diagnosis not present

## 2021-06-11 DIAGNOSIS — Z992 Dependence on renal dialysis: Secondary | ICD-10-CM | POA: Diagnosis not present

## 2021-06-11 DIAGNOSIS — N2581 Secondary hyperparathyroidism of renal origin: Secondary | ICD-10-CM | POA: Diagnosis not present

## 2021-06-13 DIAGNOSIS — N186 End stage renal disease: Secondary | ICD-10-CM | POA: Diagnosis not present

## 2021-06-13 DIAGNOSIS — Z992 Dependence on renal dialysis: Secondary | ICD-10-CM | POA: Diagnosis not present

## 2021-06-13 DIAGNOSIS — N2581 Secondary hyperparathyroidism of renal origin: Secondary | ICD-10-CM | POA: Diagnosis not present

## 2021-06-15 DIAGNOSIS — Z992 Dependence on renal dialysis: Secondary | ICD-10-CM | POA: Diagnosis not present

## 2021-06-15 DIAGNOSIS — N186 End stage renal disease: Secondary | ICD-10-CM | POA: Diagnosis not present

## 2021-06-15 DIAGNOSIS — N2581 Secondary hyperparathyroidism of renal origin: Secondary | ICD-10-CM | POA: Diagnosis not present

## 2021-06-18 DIAGNOSIS — N2581 Secondary hyperparathyroidism of renal origin: Secondary | ICD-10-CM | POA: Diagnosis not present

## 2021-06-18 DIAGNOSIS — N186 End stage renal disease: Secondary | ICD-10-CM | POA: Diagnosis not present

## 2021-06-18 DIAGNOSIS — Z992 Dependence on renal dialysis: Secondary | ICD-10-CM | POA: Diagnosis not present

## 2021-06-19 ENCOUNTER — Ambulatory Visit (INDEPENDENT_AMBULATORY_CARE_PROVIDER_SITE_OTHER): Payer: Medicare HMO | Admitting: Cardiovascular Disease

## 2021-06-19 ENCOUNTER — Encounter: Payer: Self-pay | Admitting: Cardiovascular Disease

## 2021-06-19 ENCOUNTER — Other Ambulatory Visit: Payer: Self-pay

## 2021-06-19 VITALS — BP 118/64 | HR 90 | Ht 74.0 in | Wt 302.6 lb

## 2021-06-19 DIAGNOSIS — Z8679 Personal history of other diseases of the circulatory system: Secondary | ICD-10-CM

## 2021-06-19 DIAGNOSIS — I5032 Chronic diastolic (congestive) heart failure: Secondary | ICD-10-CM | POA: Diagnosis not present

## 2021-06-19 DIAGNOSIS — N186 End stage renal disease: Secondary | ICD-10-CM | POA: Diagnosis not present

## 2021-06-19 DIAGNOSIS — I959 Hypotension, unspecified: Secondary | ICD-10-CM | POA: Diagnosis not present

## 2021-06-19 DIAGNOSIS — I700 Atherosclerosis of aorta: Secondary | ICD-10-CM

## 2021-06-19 DIAGNOSIS — Z6837 Body mass index (BMI) 37.0-37.9, adult: Secondary | ICD-10-CM

## 2021-06-19 DIAGNOSIS — E1122 Type 2 diabetes mellitus with diabetic chronic kidney disease: Secondary | ICD-10-CM

## 2021-06-19 DIAGNOSIS — E785 Hyperlipidemia, unspecified: Secondary | ICD-10-CM

## 2021-06-19 DIAGNOSIS — G4733 Obstructive sleep apnea (adult) (pediatric): Secondary | ICD-10-CM | POA: Diagnosis not present

## 2021-06-19 DIAGNOSIS — Z992 Dependence on renal dialysis: Secondary | ICD-10-CM

## 2021-06-19 DIAGNOSIS — I4891 Unspecified atrial fibrillation: Secondary | ICD-10-CM | POA: Diagnosis not present

## 2021-06-19 NOTE — Progress Notes (Signed)
Cardiology Office Note:    Date:  06/19/2021   ID:  MAKSYMILIAN MABEY, DOB [birth year], MRN [id number]  PCP:  Haydee Salter, MD   [facility] Providers Cardiologist:  Johncarlo Maalouf     Referring MD: Haydee Salter, MD   Chief Complaint  Patient presents with   Follow-up  Hypotension  History of Present Illness:    [name] is a 46 y.o. male with a hx of longstanding end-stage renal disease since 2006, attributable to malignant hypertension at a young age.  For a while he was on peritoneal dialysis, but his peritoneal catheter has been removed. He received a transplanted kidney in 2008 (DDKT), that eventually failed in 2014 and he has been back on dialysis ever since.   He has had numerous AV fistulas placed in both upper extremities over the years that have repeatedly failed and he is now being dialyzed through a left thigh AV graft.  He is trying to get another kidney transplant through [facility], but has been rejected due to persistent problems with low blood pressure.  He does not qualify for transplantation if his blood pressure is elevated with medication such as midodrine.   His weight has varied quite a bit over the last few months.  When he was dialyzed down to 286 pounds he had frequent spells of hypotension, but at his current weight of around 300 pounds hypotension has not been an issue and he has not had either edema or exertional dyspnea..  His episodes of hypotension have not been symptomatic.  He underwent an arterial Doppler study on 12/28/2020.  This shows that there is broad discrepancy between blood pressure recorded in his 4 limbs.  Systolic blood pressure was highest in his right calf (117 mmHg), similar in his left leg at 109 mmHg (he is currently being dialyzed via a left femoral AV graft) and was substantially lower in his upper extremities (94 on the right, 87 on the left).  This may explain why his episodes of "hypotension" are not  symptomatic since by checking blood pressure in his upper extremities, we may be underestimating his real blood pressure by as much as 25-30 mmHg.  He has a longstanding history of morbid obesity with a peak weight that was over 400 pounds.  He has lost about 100 pounds since then, 70 of those just in the last year.  He initially presented with congestive heart failure, malignant hypertension and paroxysmal atrial fibrillation 2001.  He has not had problems with atrial fibrillation since that initial time.  He is not on anticoagulants.  He reports that his symptoms of daytime hypersomnolence completely resolved after weight loss of the 100 pounds and he is not currently using CPAP.  He denies problems with chest pain or shortness of breath either at rest or with activity.  He tries to exercise by running or lifting light weights, without problems of dizziness or syncope.  He denies palpitations.  He has not had falls or bleeding problems.  He denies orthopnea, PND or lower extremity edema.  He is anuric.  He did very well on his stress echo in October 2021 when he was able to exercise to 8.6 METS and had a "hypertensive response" to exercise, without ECG changes or echocardiographic abnormalities.  His transthoracic echo from June 2020 reports normal left ventricular systolic function with moderate LVH, aortic valve sclerosis without stenosis and mild dilation of the ascending aorta at 42 mm.  The right atrium  was described as mildly dilated, the right ventricle was moderately enlarged and had severely reduced systolic function and the estimated PA systolic pressure was 45 mmHg.  In 2020 he had acute respiratory failure requiring intubation due to a severe pneumonia.  Despite his impressive weight loss, he remains severely obese.  He has diabetes mellitus but is not taking any medications with a recent hemoglobin A1c of 6.5%.  He has dyslipidemia (LDL 146, HDL 35), not on medications.  He has incidentally  noted aortic atherosclerosis on chest x-ray from June 2020, asymptomatic.  Past Medical History:  Diagnosis Date   Acute respiratory failure with hypoxia ([place]) 11/30/2018   Anemia    ESRD   ESRD on hemodialysis ([place]) 06/07/2011   TTS [place]. Started HD in 2006, got transplant in 2014 lasted until Dec 2019 then went back on HD.  Has L thigh AVG as of Jun 2020.  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    Hypertension    Morbid obesity ([place])    Paroxysmal atrial fibrillation ([place])    Sepsis ([place]) 11/30/2018   Sleep apnea     Past Surgical History:  Procedure Laterality Date   ARTERIOVENOUS GRAFT PLACEMENT  08/09/2010   Left Thigh Graft by Dr. [name]   AV FISTULA PLACEMENT Right 05/18/2018   Procedure: INSERTION OF ARTERIOVENOUS (AV) GORE-TEX GRAFT Left THIGH;  Surgeon: Waynetta Sandy, MD;  Location: [facility];  Service: Vascular;  Laterality: Right;   AV FISTULA PLACEMENT Right 02/15/2021   Procedure: INSERTION OF ARTERIOVENOUS (AV) GORE-TEX LOOP GRAFT RIGHT THIGH;  Surgeon: Waynetta Sandy, MD;  Location: [facility];  Service: Vascular;  Laterality: Right;   CAPD REMOVAL  05/08/2011   Procedure: CONTINUOUS AMBULATORY PERITONEAL DIALYSIS  (CAPD) CATHETER REMOVAL;  Surgeon: [name], MD;  Location: [place];  Service: General;  Laterality: N/A;  Removal of CAPD catheter, Dr. requests to go after [place]  10/05/2010   Right Femoral Cath insertion by Dr. [name].  Pt ahas had several caths inserted.   INSERTION OF DIALYSIS CATHETER Right 02/15/2021   Procedure: Attempted INSERTION OF RIGHT and Left Internal Jugular DIALYSIS CATHETER, Insertion of Left Femoral Vein Dialysis Catheter;  Surgeon: Waynetta Sandy, MD;  Location: [facility];  Service: Vascular;  Laterality: Right;   INSERTION OF DIALYSIS CATHETER Right 04/26/2021   Procedure: INSERTION OF TUNNELED 55cm PALIDROME PRECISION CHRONIC DIALYSIS CATHETER;  Surgeon:  Cherre Robins, MD;  Location: [facility];  Service: Vascular;  Laterality: Right;   IR FLUORO GUIDE CV LINE RIGHT  05/11/2018   IR US GUIDE VASC ACCESS RIGHT  05/11/2018   KIDNEY TRANSPLANT  2014   KNEE ARTHROSCOPY Left    THROMBECTOMY W/ EMBOLECTOMY Right 04/26/2021   Procedure: REMOVAL OF RIGHT THIGH ARTERIOVENOUS GORE-TEX GRAFT AND REPAIR OF RIGHT COMMON FEMORAL ARTERY;  Surgeon: Cherre Robins, MD;  Location: [facility];  Service: Vascular;  Laterality: Right;   UPPER EXTREMITY ANGIOGRAPHY Bilateral 05/13/2018   Procedure: UPPER EXTREMITY ANGIOGRAPHY - bilarteral;  Surgeon: Marty Heck, MD;  Location: [facility];  Service: Cardiovascular;  Laterality: Bilateral;    Current Medications: Current Meds  Medication Sig   atorvastatin (LIPITOR) 40 MG tablet Take 1 tablet (40 mg total) by mouth daily.   ferric citrate (AURYXIA) 1 GM 210 MG(Fe) tablet Take 630 mg by mouth 3 (three) times daily with meals.   heparin 1000 unit/mL SOLN injection Heparin Sodium (  Porcine) 1,000 Units/mL Catheter Lock Arterial   oxyCODONE-acetaminophen (PERCOCET) 5-325 MG tablet Take 1 tablet by mouth every 6 (six) hours as needed.     Allergies:   Patient has no known allergies.   Social History   Socioeconomic History   Marital status: Single    Spouse name: Not on file   Number of children: 0   Years of education: Not on file   Highest education level: Not on file  Occupational History   Not on file  Tobacco Use   Smoking status: Former    Types: Cigarettes    Quit date: 11/08/2015    Years since quitting: 5.6   Smokeless tobacco: Never   Tobacco comments:    Smokes a cigarette about every 3-4 days.  Vaping Use   Vaping Use: Never used  Substance and Sexual Activity   Alcohol use: Yes    Comment: Socially   Drug use: Yes    Frequency: 10.0 times per week    Types: Marijuana    Comment: 1-2 joints per day   Sexual activity: Yes  Other Topics Concern   Not on file  Social History  Narrative   Lives alone.    Social Determinants of Health   Financial Resource Strain: Not on file  Food Insecurity: Not on file  Transportation Needs: Not on file  Physical Activity: Not on file  Stress: Not on file  Social Connections: Not on file     Family History: The patient's family history includes Diabetes in his father; Stroke in his father and maternal grandmother. There is no history of Anesthesia problems.  ROS:   Please see the history of present illness.     All other systems reviewed and are negative.  EKGs/Labs/Other Studies Reviewed:    The following studies were reviewed today: Stress Echo (04/08/2020) IMPRESSIONS   1. Fair exercise capacity, achieved 8.6 METS   2. Hypertensive response to exercise   3. Upsloping ST depression with exercise, no evidence of ischemia.   4. This is a negative stress echocardiogram for ischemia.   5. This is a low risk study.    Echocardiogram (12/01/2018) Note that this study was performed during hospitalization with acute respiratory failure due to pneumonia IMPRESSIONS   1. The left ventricle has normal systolic function, with an ejection fraction of 55-60%. The cavity size was normal. There is moderately increased left ventricular wall thickness. Left ventricular diastolic  Doppler parameters are consistent with impaired relaxation. No evidence of left ventricular regional wall motion abnormalities. D-shaped interventricular septum suggestive of RV pressure/volume overload.   2. Right atrial size was mildly dilated.   3. Trivial pericardial effusion is present.   4. The aortic valve is tricuspid. Mild calcification of the aortic valve. No stenosis of the aortic valve.   5. The aortic root is normal in size and structure.   6. There is mild dilatation of the ascending aorta measuring 42 mm.   7. The right ventricle has severely reduced systolic function. The cavity was moderately enlarged. There is no increase in right  ventricular wall thickness.   8. The inferior vena cava was dilated in size with <50% respiratory variability. PA systolic pressure 45 mmHg.   9. No evidence of mitral valve stenosis. No significant mitral regurgitation.      EKG:  EKG is ordered today.  It shows normal sinus rhythm, left axis deviation, generalized low voltage due to obesity, otherwise normal tracing.  QTc 430 ms  Recent  Labs: 12/05/2020: ALT 10; TSH 1.90 04/26/2021: BUN 86; Creatinine, Ser 18.00; Hemoglobin 11.6; Platelets 190; Potassium 5.8; Sodium 137  Recent Lipid Panel    Component Value Date/Time   CHOL 215 (H) 12/05/2020 1021   TRIG 168.0 (H) 12/05/2020 1021   HDL 35.30 (L) 12/05/2020 1021   CHOLHDL 6 12/05/2020 1021   VLDL 33.6 12/05/2020 1021   LDLCALC 146 (H) 12/05/2020 1021     Risk Assessment/Calculations:           Physical Exam:    VS:  BP 118/64    Pulse 90    Ht 6\' 2"  (1.88 m)    Wt (!) 302 lb 9.6 oz (137.3 kg)    SpO2 98%    BMI 38.85 kg/m     Wt Readings from Last 3 Encounters:  06/19/21 (!) 302 lb 9.6 oz (137.3 kg)  05/07/21 (!) 315 lb 12.8 oz (143.2 kg)  04/27/21 (!) 317 lb 0.7 oz (143.8 kg)      General: Alert, oriented x3, no distress, severely obese. Head: no evidence of trauma, PERRL, EOMI, no exophtalmos or lid lag, no myxedema, no xanthelasma; normal ears, nose and oropharynx Neck: normal jugular venous pulsations and no hepatojugular reflux; brisk carotid pulses without delay and no carotid bruits Chest: clear to auscultation, no signs of consolidation by percussion or palpation, normal fremitus, symmetrical and full respiratory excursions Cardiovascular: normal position and quality of the apical impulse, regular rhythm, normal first and second heart sounds, no murmurs, rubs or gallops Abdomen: no tenderness or distention, no masses by palpation, no abnormal pulsatility or arterial bruits, normal bowel sounds, no hepatosplenomegaly Extremities: no clubbing, cyanosis or edema;  bilateral upper extremity occluded AV fistula sites.  Right femoral AV graft with good thrill/bruit. Neurological: grossly nonfocal Psych: Normal mood and affect  ASSESSMENT:    1. Hypotension, unspecified hypotension type   2. ESRD on dialysis ([place])   3. History of malignant hypertension   4. History of atrial fibrillation   5. Chronic diastolic congestive heart failure ([place])   6. OSA (obstructive sleep apnea)   7. Aortic atherosclerosis ([place])   8. Dyslipidemia (high LDL; low HDL)   9. Type 2 diabetes mellitus with end-stage renal disease ([place])   10. Class 2 severe obesity due to excess calories with serious comorbidity and body mass index (BMI) of 37.0 to 37.9 in adult [facility])     PLAN:    In order of problems listed above:  Hypotension: At least to some degree this has been an artifactual diagnosis.  I suspect that the blood pressure is upper extremity is diminished due to injury to the arterial system after multiple AV graft placement surgeries.  This would explain why his episodes of hypotension have been asymptomatic. Would use the blood pressure in his left leg as the "standard".  ESRD on HD: He tolerates dialysis without complaints, despite the fact that his blood pressure is reportedly severely decreased.  He is running out of dialysis access sites.  I think his best option is to get back on the kidney transplant list. HTN: Reportedly kidney failure is due to malignant hypertension.  His blood pressure is currently in acceptable range, even when adjusting for the 20-30 mmHg difference between arms and legs.  Accounting for this difference, I would suggest that his ideal systolic blood pressure is around 100 mmHg when checked in his upper extremities.   Remote history of paroxysmal atrial fibrillation: This is mentioned in his diagnoses but have not  been able to find any clear confirmation of this in the chart.  I looked through the [place] at our situation I find no ECGs with  atrial fibrillation going back all the way to 2004.  He believes this only happened when he had his initial presentation with heart failure in 2001. History of congestive heart failure: He has moderate LVH by echo and evidence of impaired relaxation, but has not had overt heart failure in years.  His echocardiogram from 2019 did show supranormal filling, consistent with elevated left atrial filling pressures, but as far as I know this was asymptomatic. OSA: He has lost 100 pounds or more and believes that this is "cured".  He denies daytime hypersomnolence.  He is no longer using CPAP.  His echocardiogram from 2020 showed severe right ventricular dysfunction, but at that time he had acute cor pulmonale due to hypoxic respiratory failure/pneumonia.  Echocardiogram from 2019 showed normal right ventricular size and systolic function and reported a normal size right atrium.  I have reviewed both echocardiograms and agree with the interpretation on both.  It appears that he had transient right heart failure/dilation due to the acute illness, rather than chronic right heart failure from sleep apnea. Aortic atherosclerosis: Asymptomatic.  Mildly dilated ascending aorta per the echocardiogram, but he is very tall, broad shouldered and for him 4.2 cm probably a very mild degree of dilation.  Consider reevaluating this with a CT angiogram. HLP: Target LDL less than 70.  He has started treatment with atorvastatin.  Needs to have a repeat lipid profile on medication.  He will do this with his PCP. DM: He has managed to control his blood glucose levels with exercise and weight loss and has an acceptable hemoglobin A1c without medications.  He plans to continue losing weight. Severe obesity: Encouraged him to continue losing weight.  He still in the severely obese range.       Medication Adjustments/Labs and Tests Ordered: Current medicines are reviewed at length with the patient today.  Concerns regarding medicines  are outlined above.  Orders Placed This Encounter  Procedures   EKG 12-Lead   No orders of the defined types were placed in this encounter.    Patient Instructions  Medication Instructions:  No changes *If you need a refill on your cardiac medications before your next appointment, please call your pharmacy*   Lab Work: None ordered If you have labs (blood work) drawn today and your tests are completely normal, you will receive your results only by: [place] (if you have MyChart) OR A paper copy in the mail If you have any lab test that is abnormal or we need to change your treatment, we will call you to review the results.   Testing/Procedures: None ordered   Follow-Up: At [facility], you and your health needs are our priority.  As part of our continuing mission to provide you with exceptional heart care, we have created designated Provider Care Teams.  These Care Teams include your primary Cardiologist (physician) and Advanced Practice Providers (APPs -  Physician Assistants and Nurse Practitioners) who all work together to provide you with the care you need, when you need it.  We recommend signing up for the patient portal called "MyChart".  Sign up information is provided on this After Visit Summary.  MyChart is used to connect with patients for Virtual Visits (Telemedicine).  Patients are able to view lab/test results, encounter notes, upcoming appointments, etc.  Non-urgent messages can be sent  to your provider as well.   To learn more about what you can do with MyChart, go to [email or web address].    Your next appointment:   12 month(s)  The format for your next appointment:   In Person  Provider:   None       Signed, Sanda Klein, MD  06/19/2021 4:25 PM    [place]

## 2021-06-19 NOTE — Patient Instructions (Signed)
Medication Instructions:  No changes *If you need a refill on your cardiac medications before your next appointment, please call your pharmacy*   Lab Work: None ordered If you have labs (blood work) drawn today and your tests are completely normal, you will receive your results only by: MyChart Message (if you have MyChart) OR A paper copy in the mail If you have any lab test that is abnormal or we need to change your treatment, we will call you to review the results.   Testing/Procedures: None ordered   Follow-Up: At [facility], you and your health needs are our priority.  As part of our continuing mission to provide you with exceptional heart care, we have created designated Provider Care Teams.  These Care Teams include your primary Cardiologist (physician) and Advanced Practice Providers (APPs -  Physician Assistants and Nurse Practitioners) who all work together to provide you with the care you need, when you need it.  We recommend signing up for the patient portal called "MyChart".  Sign up information is provided on this After Visit Summary.  MyChart is used to connect with patients for Virtual Visits (Telemedicine).  Patients are able to view lab/test results, encounter notes, upcoming appointments, etc.  Non-urgent messages can be sent to your provider as well.   To learn more about what you can do with MyChart, go to [email or web address].    Your next appointment:   12 month(s)  The format for your next appointment:   In Person  Provider:   None      

## 2021-06-20 DIAGNOSIS — N2581 Secondary hyperparathyroidism of renal origin: Secondary | ICD-10-CM | POA: Diagnosis not present

## 2021-06-20 DIAGNOSIS — N186 End stage renal disease: Secondary | ICD-10-CM | POA: Diagnosis not present

## 2021-06-20 DIAGNOSIS — Z992 Dependence on renal dialysis: Secondary | ICD-10-CM | POA: Diagnosis not present

## 2021-06-22 DIAGNOSIS — Z992 Dependence on renal dialysis: Secondary | ICD-10-CM | POA: Diagnosis not present

## 2021-06-22 DIAGNOSIS — N186 End stage renal disease: Secondary | ICD-10-CM | POA: Diagnosis not present

## 2021-06-22 DIAGNOSIS — N2581 Secondary hyperparathyroidism of renal origin: Secondary | ICD-10-CM | POA: Diagnosis not present

## 2021-06-25 DIAGNOSIS — N186 End stage renal disease: Secondary | ICD-10-CM | POA: Diagnosis not present

## 2021-06-25 DIAGNOSIS — N2581 Secondary hyperparathyroidism of renal origin: Secondary | ICD-10-CM | POA: Diagnosis not present

## 2021-06-25 DIAGNOSIS — Z992 Dependence on renal dialysis: Secondary | ICD-10-CM | POA: Diagnosis not present

## 2021-06-27 DIAGNOSIS — Z992 Dependence on renal dialysis: Secondary | ICD-10-CM | POA: Diagnosis not present

## 2021-06-27 DIAGNOSIS — N2581 Secondary hyperparathyroidism of renal origin: Secondary | ICD-10-CM | POA: Diagnosis not present

## 2021-06-27 DIAGNOSIS — N186 End stage renal disease: Secondary | ICD-10-CM | POA: Diagnosis not present

## 2021-06-29 DIAGNOSIS — N186 End stage renal disease: Secondary | ICD-10-CM | POA: Diagnosis not present

## 2021-06-29 DIAGNOSIS — Z992 Dependence on renal dialysis: Secondary | ICD-10-CM | POA: Diagnosis not present

## 2021-06-29 DIAGNOSIS — N2581 Secondary hyperparathyroidism of renal origin: Secondary | ICD-10-CM | POA: Diagnosis not present

## 2021-07-02 ENCOUNTER — Other Ambulatory Visit (HOSPITAL_COMMUNITY): Payer: Self-pay | Admitting: Nephrology

## 2021-07-02 ENCOUNTER — Other Ambulatory Visit: Payer: Self-pay | Admitting: Radiology

## 2021-07-02 DIAGNOSIS — N2581 Secondary hyperparathyroidism of renal origin: Secondary | ICD-10-CM | POA: Diagnosis not present

## 2021-07-02 DIAGNOSIS — N186 End stage renal disease: Secondary | ICD-10-CM | POA: Diagnosis not present

## 2021-07-02 DIAGNOSIS — Z992 Dependence on renal dialysis: Secondary | ICD-10-CM | POA: Diagnosis not present

## 2021-07-03 ENCOUNTER — Other Ambulatory Visit (HOSPITAL_COMMUNITY): Payer: Self-pay | Admitting: Nephrology

## 2021-07-03 ENCOUNTER — Other Ambulatory Visit: Payer: Self-pay

## 2021-07-03 ENCOUNTER — Ambulatory Visit (HOSPITAL_COMMUNITY)
Admission: RE | Admit: 2021-07-03 | Discharge: 2021-07-03 | Disposition: A | Discharge status: Home / Self Care | Payer: Medicare HMO | Source: Ambulatory Visit | Attending: Nephrology | Admitting: Nephrology

## 2021-07-03 DIAGNOSIS — Y832 Surgical operation with anastomosis, bypass or graft as the cause of abnormal reaction of the patient, or of later complication, without mention of misadventure at the time of the procedure: Secondary | ICD-10-CM | POA: Diagnosis not present

## 2021-07-03 DIAGNOSIS — T8249XA Other complication of vascular dialysis catheter, initial encounter: Secondary | ICD-10-CM | POA: Insufficient documentation

## 2021-07-03 DIAGNOSIS — T82828A Fibrosis of vascular prosthetic devices, implants and grafts, initial encounter: Secondary | ICD-10-CM | POA: Diagnosis not present

## 2021-07-03 DIAGNOSIS — N186 End stage renal disease: Secondary | ICD-10-CM

## 2021-07-03 HISTORY — PX: IR PTA ADDL CENTRAL DIALYSIS SEG THRU DIALY CIRCUIT RIGHT: IMG6121

## 2021-07-03 HISTORY — PX: IR FLUORO GUIDE CV LINE RIGHT: IMG2283

## 2021-07-03 MED ORDER — LIDOCAINE HCL 1 % IJ SOLN
INTRAMUSCULAR | Status: DC | PRN
  Administered 2021-07-03: 20 mL

## 2021-07-03 MED ORDER — CHLORHEXIDINE GLUCONATE 4 % EX LIQD
CUTANEOUS | Status: AC
  Filled 2021-07-03: qty 15

## 2021-07-03 MED ORDER — HEPARIN SODIUM (PORCINE) 1000 UNIT/ML IJ SOLN
INTRAMUSCULAR | Status: AC
  Filled 2021-07-03: qty 10

## 2021-07-03 MED ORDER — IOHEXOL 300 MG/ML  SOLN
100.0000 mL | Freq: Once | INTRAMUSCULAR | Status: AC | PRN
  Administered 2021-07-03: 17:00:00 25 mL via INTRAVENOUS

## 2021-07-03 MED ORDER — LIDOCAINE HCL 1 % IJ SOLN
INTRAMUSCULAR | Status: AC
  Filled 2021-07-03: qty 20

## 2021-07-03 MED ORDER — ONDANSETRON HCL 4 MG/2ML IJ SOLN
4.0000 mg | Freq: Once | INTRAMUSCULAR | Status: AC
  Administered 2021-07-03: 17:00:00 4 mg via INTRAVENOUS

## 2021-07-03 MED ORDER — ONDANSETRON HCL 4 MG/2ML IJ SOLN
INTRAMUSCULAR | Status: AC
  Filled 2021-07-03: qty 2

## 2021-07-03 MED ORDER — HEPARIN SODIUM (PORCINE) 1000 UNIT/ML IJ SOLN
INTRAMUSCULAR | Status: DC | PRN
  Administered 2021-07-03: 1000 [IU] via INTRAVENOUS

## 2021-07-03 MED ORDER — CEFAZOLIN SODIUM-DEXTROSE 2-4 GM/100ML-% IV SOLN
INTRAVENOUS | Status: AC
  Administered 2021-07-03: 17:00:00 2000 mg
  Filled 2021-07-03: qty 100

## 2021-07-03 MED ORDER — CEFAZOLIN (ANCEF) 1 G IV SOLR: 2.0000 g | INTRAVENOUS | Status: DC

## 2021-07-03 NOTE — Procedures (Signed)
Interventional Radiology Procedure Note   History: 46 year old male with renal failure.  Prior right femoral loop graft excision for nonfunction, with placement of right femoral tunneled HD 04/26/21.   Nonfunction last week, with the patient reporting replacement last week at outpatient lab.  This is flowing slowly, reported by the patient.   Procedure:  Replacement of the right femoral tunneled HD catheter.  New 55cm split cath, terminating at the hepatic IVC.  Angiogram revealing fibrin sheath, with 70m balloon angioplasty of the fibrin sheath at the hepatic IVC.   Findings: Excellent flow upon completion.  .  Complications: None  Recommendations:  - OK to use - OK for DC now when goals met - routine catheter care  Signed,  JDulcy Fanny WEarleen Newport DO

## 2021-07-04 DIAGNOSIS — N186 End stage renal disease: Secondary | ICD-10-CM | POA: Diagnosis not present

## 2021-07-04 DIAGNOSIS — Z992 Dependence on renal dialysis: Secondary | ICD-10-CM | POA: Diagnosis not present

## 2021-07-04 DIAGNOSIS — N2581 Secondary hyperparathyroidism of renal origin: Secondary | ICD-10-CM | POA: Diagnosis not present

## 2021-07-06 DIAGNOSIS — N186 End stage renal disease: Secondary | ICD-10-CM | POA: Diagnosis not present

## 2021-07-06 DIAGNOSIS — Z992 Dependence on renal dialysis: Secondary | ICD-10-CM | POA: Diagnosis not present

## 2021-07-06 DIAGNOSIS — N2581 Secondary hyperparathyroidism of renal origin: Secondary | ICD-10-CM | POA: Diagnosis not present

## 2021-07-08 DIAGNOSIS — N186 End stage renal disease: Secondary | ICD-10-CM | POA: Diagnosis not present

## 2021-07-08 DIAGNOSIS — Z992 Dependence on renal dialysis: Secondary | ICD-10-CM | POA: Diagnosis not present

## 2021-07-08 DIAGNOSIS — Z01810 Encounter for preprocedural cardiovascular examination: Secondary | ICD-10-CM | POA: Diagnosis not present

## 2021-07-09 DIAGNOSIS — Z992 Dependence on renal dialysis: Secondary | ICD-10-CM | POA: Diagnosis not present

## 2021-07-09 DIAGNOSIS — N2581 Secondary hyperparathyroidism of renal origin: Secondary | ICD-10-CM | POA: Diagnosis not present

## 2021-07-09 DIAGNOSIS — N186 End stage renal disease: Secondary | ICD-10-CM | POA: Diagnosis not present

## 2021-07-09 DIAGNOSIS — N041 Nephrotic syndrome with focal and segmental glomerular lesions: Secondary | ICD-10-CM | POA: Diagnosis not present

## 2021-07-10 DIAGNOSIS — N186 End stage renal disease: Secondary | ICD-10-CM | POA: Diagnosis not present

## 2021-07-10 DIAGNOSIS — Z992 Dependence on renal dialysis: Secondary | ICD-10-CM | POA: Diagnosis not present

## 2021-07-10 DIAGNOSIS — T8249XA Other complication of vascular dialysis catheter, initial encounter: Secondary | ICD-10-CM | POA: Diagnosis not present

## 2021-07-11 DIAGNOSIS — Z992 Dependence on renal dialysis: Secondary | ICD-10-CM | POA: Diagnosis not present

## 2021-07-11 DIAGNOSIS — N186 End stage renal disease: Secondary | ICD-10-CM | POA: Diagnosis not present

## 2021-07-11 DIAGNOSIS — N2581 Secondary hyperparathyroidism of renal origin: Secondary | ICD-10-CM | POA: Diagnosis not present

## 2021-07-15 ENCOUNTER — Observation Stay (HOSPITAL_COMMUNITY)
Admission: EM | Admit: 2021-07-15 | Discharge: 2021-07-16 | Discharge status: Against Medical Advice | Payer: Medicare HMO | Attending: Internal Medicine | Admitting: Internal Medicine

## 2021-07-15 ENCOUNTER — Other Ambulatory Visit: Payer: Self-pay

## 2021-07-15 ENCOUNTER — Encounter (HOSPITAL_COMMUNITY): Payer: Self-pay

## 2021-07-15 DIAGNOSIS — Z5321 Procedure and treatment not carried out due to patient leaving prior to being seen by health care provider: Secondary | ICD-10-CM | POA: Diagnosis not present

## 2021-07-15 DIAGNOSIS — E875 Hyperkalemia: Secondary | ICD-10-CM

## 2021-07-15 DIAGNOSIS — N186 End stage renal disease: Secondary | ICD-10-CM | POA: Diagnosis not present

## 2021-07-15 DIAGNOSIS — T8241XA Breakdown (mechanical) of vascular dialysis catheter, initial encounter: Secondary | ICD-10-CM | POA: Diagnosis not present

## 2021-07-15 DIAGNOSIS — Z992 Dependence on renal dialysis: Secondary | ICD-10-CM

## 2021-07-15 DIAGNOSIS — Z6841 Body Mass Index (BMI) 40.0 and over, adult: Secondary | ICD-10-CM | POA: Insufficient documentation

## 2021-07-15 DIAGNOSIS — I120 Hypertensive chronic kidney disease with stage 5 chronic kidney disease or end stage renal disease: Secondary | ICD-10-CM | POA: Diagnosis not present

## 2021-07-15 DIAGNOSIS — I10 Essential (primary) hypertension: Secondary | ICD-10-CM | POA: Diagnosis not present

## 2021-07-15 DIAGNOSIS — Z79899 Other long term (current) drug therapy: Secondary | ICD-10-CM | POA: Insufficient documentation

## 2021-07-15 DIAGNOSIS — T82898A Other specified complication of vascular prosthetic devices, implants and grafts, initial encounter: Secondary | ICD-10-CM

## 2021-07-15 DIAGNOSIS — Z20822 Contact with and (suspected) exposure to covid-19: Secondary | ICD-10-CM | POA: Diagnosis not present

## 2021-07-15 DIAGNOSIS — E1122 Type 2 diabetes mellitus with diabetic chronic kidney disease: Secondary | ICD-10-CM | POA: Diagnosis not present

## 2021-07-15 DIAGNOSIS — Y841 Kidney dialysis as the cause of abnormal reaction of the patient, or of later complication, without mention of misadventure at the time of the procedure: Secondary | ICD-10-CM | POA: Diagnosis not present

## 2021-07-15 DIAGNOSIS — T82868A Thrombosis of vascular prosthetic devices, implants and grafts, initial encounter: Secondary | ICD-10-CM | POA: Diagnosis not present

## 2021-07-15 DIAGNOSIS — G4733 Obstructive sleep apnea (adult) (pediatric): Secondary | ICD-10-CM | POA: Diagnosis not present

## 2021-07-15 DIAGNOSIS — I82220 Acute embolism and thrombosis of inferior vena cava: Secondary | ICD-10-CM | POA: Diagnosis not present

## 2021-07-15 DIAGNOSIS — R9431 Abnormal electrocardiogram [ECG] [EKG]: Secondary | ICD-10-CM | POA: Diagnosis not present

## 2021-07-15 DIAGNOSIS — E1129 Type 2 diabetes mellitus with other diabetic kidney complication: Secondary | ICD-10-CM | POA: Diagnosis not present

## 2021-07-15 LAB — CBC WITH DIFFERENTIAL/PLATELET
Abs Immature Granulocytes: 0.02 10*3/uL (ref 0.00–0.07)
Basophils Absolute: 0.1 10*3/uL (ref 0.0–0.1)
Basophils Relative: 1 %
Eosinophils Absolute: 0.4 10*3/uL (ref 0.0–0.5)
Eosinophils Relative: 7 %
HCT: 39.5 % (ref 39.0–52.0)
Hemoglobin: 12.6 g/dL — ABNORMAL LOW (ref 13.0–17.0)
Immature Granulocytes: 0 %
Lymphocytes Relative: 23 %
Lymphs Abs: 1.3 10*3/uL (ref 0.7–4.0)
MCH: 30.1 pg (ref 26.0–34.0)
MCHC: 31.9 g/dL (ref 30.0–36.0)
MCV: 94.3 fL (ref 80.0–100.0)
Monocytes Absolute: 0.7 10*3/uL (ref 0.1–1.0)
Monocytes Relative: 13 %
Neutro Abs: 3.1 10*3/uL (ref 1.7–7.7)
Neutrophils Relative %: 56 %
Platelets: 194 10*3/uL (ref 150–400)
RBC: 4.19 MIL/uL — ABNORMAL LOW (ref 4.22–5.81)
RDW: 16.2 % — ABNORMAL HIGH (ref 11.5–15.5)
WBC: 5.5 10*3/uL (ref 4.0–10.5)
nRBC: 0 % (ref 0.0–0.2)

## 2021-07-15 LAB — CBG MONITORING, ED: Glucose-Capillary: 116 mg/dL — ABNORMAL HIGH (ref 70–99)

## 2021-07-15 LAB — BASIC METABOLIC PANEL
Anion gap: 18 — ABNORMAL HIGH (ref 5–15)
BUN: 83 mg/dL — ABNORMAL HIGH (ref 6–20)
CO2: 23 mmol/L (ref 22–32)
Calcium: 9 mg/dL (ref 8.9–10.3)
Chloride: 97 mmol/L — ABNORMAL LOW (ref 98–111)
Creatinine, Ser: 23.55 mg/dL — ABNORMAL HIGH (ref 0.61–1.24)
GFR, Estimated: 2 mL/min — ABNORMAL LOW (ref >60)
Glucose, Bld: 75 mg/dL (ref 70–99)
Potassium: 5.7 mmol/L — ABNORMAL HIGH (ref 3.5–5.1)
Sodium: 138 mmol/L (ref 135–145)

## 2021-07-15 LAB — RESP PANEL BY RT-PCR (FLU A&B, COVID) ARPGX2
Influenza A by PCR: NEGATIVE
Influenza B by PCR: NEGATIVE
SARS Coronavirus 2 by RT PCR: NEGATIVE

## 2021-07-15 MED ORDER — MELATONIN 5 MG PO TABS
10.0000 mg | ORAL_TABLET | Freq: Every evening | ORAL | Status: DC | PRN
  Filled 2021-07-15: qty 2

## 2021-07-15 MED ORDER — INSULIN ASPART 100 UNIT/ML IJ SOLN: 0.0000 [IU] | Freq: Three times a day (TID) | INTRAMUSCULAR | Status: DC

## 2021-07-15 MED ORDER — ACETAMINOPHEN 325 MG PO TABS: 650.0000 mg | ORAL_TABLET | Freq: Four times a day (QID) | ORAL | Status: DC | PRN

## 2021-07-15 MED ORDER — ONDANSETRON HCL 4 MG/2ML IJ SOLN: 4.0000 mg | Freq: Four times a day (QID) | INTRAMUSCULAR | Status: DC | PRN

## 2021-07-15 MED ORDER — ONDANSETRON HCL 4 MG PO TABS: 4.0000 mg | ORAL_TABLET | Freq: Four times a day (QID) | ORAL | Status: DC | PRN

## 2021-07-15 MED ORDER — INSULIN ASPART 100 UNIT/ML IJ SOLN: 0.0000 [IU] | Freq: Every day | INTRAMUSCULAR | Status: DC

## 2021-07-15 MED ORDER — ACETAMINOPHEN 650 MG RE SUPP: 650.0000 mg | Freq: Four times a day (QID) | RECTAL | Status: DC | PRN

## 2021-07-15 MED ORDER — ATORVASTATIN CALCIUM 40 MG PO TABS
40.0000 mg | ORAL_TABLET | Freq: Every day | ORAL | Status: DC
Start: 2021-07-16 — End: 2021-07-17

## 2021-07-15 MED ORDER — SODIUM ZIRCONIUM CYCLOSILICATE 10 G PO PACK
10.0000 g | PACK | Freq: Two times a day (BID) | ORAL | Status: DC
  Administered 2021-07-15: 10 g via ORAL
  Filled 2021-07-15: qty 1

## 2021-07-15 NOTE — Assessment & Plan Note (Signed)
Given dose of lokelma in ER.

## 2021-07-15 NOTE — Assessment & Plan Note (Signed)
Stable. Add SSI. 

## 2021-07-15 NOTE — Assessment & Plan Note (Signed)
Observation telemetry bed. IR to exchange out right femoral HD catheter. Pt is a vasculopathy and does not have any other vascular access options.

## 2021-07-15 NOTE — Assessment & Plan Note (Signed)
Has not had HD since last Thursday. Will need HD tomorrow. Please call nephrology tomorrow morning to arrange for HD. Pt receives HD at [facility] and is followed by [facility].

## 2021-07-15 NOTE — ED Provider Notes (Signed)
[facility] EMERGENCY DEPARTMENT Provider Note   CSN: [id number] Arrival date & time: 07/15/21  1426     History  Chief Complaint  Patient presents with   Vascular Access Problem    [name] is a 46 y.o. male here w/ clotted access site to dialysis, right femoral tunneled port, most recently exchanged 1/25 by IR.  Patient unable to complete dialysis on Saturday.  He does not make urine.  No respiratory distress.  HPI     Home Medications Prior to Admission medications   Medication Sig Start Date End Date Taking? Authorizing Provider  atorvastatin (LIPITOR) 40 MG tablet Take 1 tablet (40 mg total) by mouth daily. 02/06/21  Yes Haydee Salter, MD  ferric citrate (AURYXIA) 1 GM 210 MG(Fe) tablet Take 630 mg by mouth 3 (three) times daily with meals. 09/06/19  Yes [provider]  doxycycline (VIBRA-TABS) 100 MG tablet Take 1 tablet (100 mg total) by mouth 2 (two) times daily. Patient not taking: Reported on 06/19/2021 02/14/21   Persons, Bevely Palmer, [place]  heparin 1000 unit/mL SOLN injection Heparin Sodium (Porcine) 1,000 Units/mL Catheter Lock Arterial Patient not taking: Reported on 07/15/2021 05/02/21 04/29/22  [provider]  oxyCODONE-acetaminophen (PERCOCET) 5-325 MG tablet Take 1 tablet by mouth every 6 (six) hours as needed. Patient not taking: Reported on 07/15/2021 02/16/21   Gabriel Earing, PA-C      Allergies    Patient has no known allergies.    Review of Systems   Review of Systems  Physical Exam Updated Vital Signs BP (!) 138/97    Pulse 66    Temp 98.4 F (36.9 C) (Oral)    Resp 15    Ht 6\' 2"  (1.88 m)    Wt (!) 142 kg    SpO2 99%    BMI 40.19 kg/m  Physical Exam Constitutional:      General: He is not in acute distress. HENT:     Head: Normocephalic and atraumatic.  Eyes:     Conjunctiva/sclera: Conjunctivae normal.     Pupils: Pupils are equal, round, and reactive to light.  Cardiovascular:     Rate and Rhythm:  Normal rate and regular rhythm.  Pulmonary:     Effort: Pulmonary effort is normal. No respiratory distress.  Abdominal:     General: There is no distension.     Tenderness: There is no abdominal tenderness.  Skin:    General: Skin is warm and dry.  Neurological:     General: No focal deficit present.     Mental Status: He is alert. Mental status is at baseline.  Psychiatric:        Mood and Affect: Mood normal.        Behavior: Behavior normal.    ED Results / Procedures / Treatments   Labs (all labs ordered are listed, but only abnormal results are displayed) Labs Reviewed  BASIC METABOLIC PANEL - Abnormal; Notable for the following components:      Result Value   Potassium 5.7 (*)    Chloride 97 (*)    BUN 83 (*)    Creatinine, Ser 23.55 (*)    GFR, Estimated 2 (*)    Anion gap 18 (*)    All other components within normal limits  CBC WITH DIFFERENTIAL/PLATELET - Abnormal; Notable for the following components:   RBC 4.19 (*)    Hemoglobin 12.6 (*)    RDW 16.2 (*)    All other components  within normal limits  RESP PANEL BY RT-PCR (FLU A&B, COVID) [id number]    EKG EKG Interpretation  Date/Time:  Monday July 15 2021 18:21:18 EST Ventricular Rate:  58 PR Interval:  206 QRS Duration: 106 QT Interval:  448 QTC Calculation: 440 R Axis:   -30 Text Interpretation: Sinus rhythm Borderline prolonged PR interval Left axis deviation Low voltage, precordial leads Confirmed by Octaviano Glow [phone]) on 07/15/2021 8:02:03 PM  Radiology No results found.  Procedures Procedures    Medications Ordered in ED Medications  sodium zirconium cyclosilicate (LOKELMA) packet 10 g (10 g Oral Given 07/15/21 2125)    ED Course/ Medical Decision Making/ A&P Clinical Course as of 07/15/21 2126  Mon Jul 15, 2021  2040 I spoke to Dr Jacqulynn Cadet IR who advises admitting patient overnight for IR tunneled cath exchange tomorrow - NPO at midnight  Patient stable; not requiring emergent  dialysis, [MT]  2122 Admitted to hospitalist [MT]    Clinical Course User Index [MT] Wyvonnia Dusky, MD                           Medical Decision Making Risk Prescription drug management. Decision regarding hospitalization.   Patient has an unfortunate history of multiple vascular occlusions, per my review of external records including Dr. Luan Pulling last consult note, his right femoral groin appears to be his only available remaining dialysis access site.  He had his tunneled catheter replaced by IR 2 weeks ago.  I reconsulted IR as noted in ED course -we will plan for catheter exchange tomorrow.  Patient be made n.p.o. at midnight.  Dr Laurence Ferrari requested the medicine team contact the IR team in the morning again to confirm schedule, but he will add patient name to their list.  I reviewed the patient's labs.  He has some mild hyperkalemia, without acute hyperkalemic changes on his EKG.  I have ordered some oral Lokelma.  There may be consideration of dialysis tomorrow after his catheter exchange, versus close outpatient dialysis and follow-up the next day.  He is in no respiratory distress at this time.        Final Clinical Impression(s) / ED Diagnoses Final diagnoses:  Problem with dialysis access, initial encounter [facility])    Rx / DC Orders ED Discharge Orders     None         Wyvonnia Dusky, MD 07/15/21 2126

## 2021-07-15 NOTE — H&P (Signed)
History and Physical    [name] OIZ:[id number] DOB: 1975/09/16 DOA: 07/15/2021  DOS: the patient was seen and examined on 07/15/2021  PCP: Haydee Salter, MD   Patient coming from: Home  I have personally briefly reviewed patient's old medical records in [place]  CC: malfunction HD catheter HPI: 46 year old African-American male with a history of type 2 diabetes, hypertension, end-stage renal disease on hemodialysis since 2008, vasculopathy with limited venous access presents to the ER today with malfunctioning hemodialysis catheter.  Patient states that his last dialysis was Thursday.  He when he went to tried to get dialysis on Saturday, his dialysis catheter would not function.  He presented to the ER today for evaluation.  Patient's hemodialysis cath would not flush.  EDP is contacted interventional radiology will attempt to try to change catheter over wire tomorrow.  Patient has a long history of vasculopathy.  He is ready had both AV fistulas and AV grafts in his upper extremities which have all failed.  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  Right femoral tunneled catheter placed about 2 years ago.  This has consistently for been a problem for him and he has had to have exchanges performed frequently.  He was seen recently by vascular surgery at [facility].  By Dr. Wenda Overland.  There is a plan to try to image his venous system better with either MRI or CT.  [facility] hospitalist contacted for overnight admission so he can get his dialysis catheter exchange.  Patient will need hemodialysis tomorrow as he has not had dialysis since last Thursday.    ED Course: given lokelma due to k of 5.7  Review of Systems:  Review of Systems  Constitutional: Negative.   HENT: Negative.    Eyes: Negative.   Respiratory: Negative.    Cardiovascular: Negative.   Gastrointestinal: Negative.   Genitourinary: Negative.   Musculoskeletal: Negative.   Skin: Negative.    Neurological: Negative.   Endo/Heme/Allergies: Negative.   Psychiatric/Behavioral: Negative.    All other systems reviewed and are negative.  Past Medical History:  Diagnosis Date   Acute respiratory failure with hypoxia ([place]) 11/30/2018   Anemia    ESRD   ESRD on hemodialysis ([place]) 06/07/2011   TTS [place] [facility]. Started HD in 2006, got transplant in 2014 lasted until Dec 2019 then went back on HD.  Has L thigh AVG as of Jun 2020.  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    Hypertension    Morbid obesity ([place])    Paroxysmal atrial fibrillation ([place])    Sepsis ([place]) 11/30/2018   Sleep apnea     Past Surgical History:  Procedure Laterality Date   ARTERIOVENOUS GRAFT PLACEMENT  08/09/2010   Left Thigh Graft by Dr. [name]   AV FISTULA PLACEMENT Right 05/18/2018   Procedure: INSERTION OF ARTERIOVENOUS (AV) GORE-TEX GRAFT Left THIGH;  Surgeon: Waynetta Sandy, MD;  Location: [facility];  Service: Vascular;  Laterality: Right;   AV FISTULA PLACEMENT Right 02/15/2021   Procedure: INSERTION OF ARTERIOVENOUS (AV) GORE-TEX LOOP GRAFT RIGHT THIGH;  Surgeon: Waynetta Sandy, MD;  Location: [facility];  Service: Vascular;  Laterality: Right;   CAPD REMOVAL  05/08/2011   Procedure: CONTINUOUS AMBULATORY PERITONEAL DIALYSIS  (CAPD) CATHETER REMOVAL;  Surgeon: [name], MD;  Location: [facility];  Service: General;  Laterality: N/A;  Removal of CAPD catheter, Dr. requests to go after 100   INSERTION OF  DIALYSIS CATHETER  10/05/2010   Right Femoral Cath insertion by Dr. [name].  Pt ahas had several caths inserted.   INSERTION OF DIALYSIS CATHETER Right 02/15/2021   Procedure: Attempted INSERTION OF RIGHT and Left Internal Jugular DIALYSIS CATHETER, Insertion of Left Femoral Vein Dialysis Catheter;  Surgeon: Waynetta Sandy, MD;  Location: [facility];  Service: Vascular;  Laterality: Right;   INSERTION OF DIALYSIS CATHETER Right 04/26/2021   Procedure: INSERTION  OF TUNNELED 55cm PALIDROME PRECISION CHRONIC DIALYSIS CATHETER;  Surgeon: Cherre Robins, MD;  Location: [facility];  Service: Vascular;  Laterality: Right;   IR FLUORO GUIDE CV LINE RIGHT  05/11/2018   IR FLUORO GUIDE CV LINE RIGHT  07/03/2021   IR PTA ADDL CENTRAL DIALYSIS SEG THRU DIALY CIRCUIT RIGHT Right 07/03/2021   IR US GUIDE VASC ACCESS RIGHT  05/11/2018   KIDNEY TRANSPLANT  2014   KNEE ARTHROSCOPY Left    THROMBECTOMY W/ EMBOLECTOMY Right 04/26/2021   Procedure: REMOVAL OF RIGHT THIGH ARTERIOVENOUS GORE-TEX GRAFT AND REPAIR OF RIGHT COMMON FEMORAL ARTERY;  Surgeon: Cherre Robins, MD;  Location: [facility];  Service: Vascular;  Laterality: Right;   UPPER EXTREMITY ANGIOGRAPHY Bilateral 05/13/2018   Procedure: UPPER EXTREMITY ANGIOGRAPHY - bilarteral;  Surgeon: Marty Heck, MD;  Location: [facility];  Service: Cardiovascular;  Laterality: Bilateral;     reports that he quit smoking about 5 years ago. His smoking use included cigarettes. He has never used smokeless tobacco. He reports current alcohol use. He reports current drug use. Frequency: 10.00 times per week. Drug: Marijuana.  No Known Allergies  Family History  Problem Relation Age of Onset   Diabetes Father    Stroke Father    Stroke Maternal Grandmother    Anesthesia problems Neg Hx     Prior to Admission medications   Medication Sig Start Date End Date Taking? Authorizing Provider  atorvastatin (LIPITOR) 40 MG tablet Take 1 tablet (40 mg total) by mouth daily. 02/06/21  Yes Haydee Salter, MD  ferric citrate (AURYXIA) 1 GM 210 MG(Fe) tablet Take 630 mg by mouth 3 (three) times daily with meals. 09/06/19  Yes [provider]  doxycycline (VIBRA-TABS) 100 MG tablet Take 1 tablet (100 mg total) by mouth 2 (two) times daily. Patient not taking: Reported on 06/19/2021 02/14/21   Persons, Bevely Palmer, [place]  heparin 1000 unit/mL SOLN injection Heparin Sodium (Porcine) 1,000 Units/mL Catheter Lock Arterial Patient  not taking: Reported on 07/15/2021 05/02/21 04/29/22  [provider]  oxyCODONE-acetaminophen (PERCOCET) 5-325 MG tablet Take 1 tablet by mouth every 6 (six) hours as needed. Patient not taking: Reported on 07/15/2021 02/16/21   Gabriel Earing, PA-C    Physical Exam: Vitals:   07/15/21 1915 07/15/21 2000 07/15/21 2030 07/15/21 2103  BP: 117/82 128/80 (!) 138/97 (!) 138/97  Pulse: 66 64  66  Resp: (!) 21 19 14 15   Temp:    98.4 F (36.9 C)  TempSrc:    Oral  SpO2: 98% 99%  99%  Weight:      Height:        Physical Exam Vitals and nursing note reviewed.  Constitutional:      General: He is not in acute distress.    Appearance: He is obese. He is not ill-appearing, toxic-appearing or diaphoretic.  HENT:     Head: Normocephalic and atraumatic.     Nose: Nose normal.  Eyes:     General: No scleral icterus. Cardiovascular:  Rate and Rhythm: Normal rate and regular rhythm.  Pulmonary:     Effort: Pulmonary effort is normal. No respiratory distress.     Breath sounds: Normal breath sounds. No wheezing.  Abdominal:     General: Bowel sounds are normal. There is no distension.     Tenderness: There is no abdominal tenderness. There is no guarding.  Musculoskeletal:     Right lower leg: No edema.     Left lower leg: No edema.  Skin:    General: Skin is warm and dry.     Capillary Refill: Capillary refill takes less than 2 seconds.  Neurological:     General: No focal deficit present.     Mental Status: He is alert and oriented to person, place, and time.     Labs on Admission: I have personally reviewed following labs and imaging studies  CBC: Recent Labs  Lab 07/15/21 1829  WBC 5.5  NEUTROABS 3.1  HGB 12.6*  HCT 39.5  MCV 94.3  PLT 264   Basic Metabolic Panel: Recent Labs  Lab 07/15/21 1829  NA 138  K 5.7*  CL 97*  CO2 23  GLUCOSE 75  BUN 83*  CREATININE 23.55*  CALCIUM 9.0   GFR: Estimated Creatinine Clearance: 5.9 mL/min (A) (by C-G  formula based on SCr of 23.55 mg/dL (H)). Liver Function Tests: No results for input(s): AST, ALT, ALKPHOS, BILITOT, PROT, ALBUMIN in the last 168 hours. No results for input(s): LIPASE, AMYLASE in the last 168 hours. No results for input(s): AMMONIA in the last 168 hours. Coagulation Profile: No results for input(s): INR, PROTIME in the last 168 hours. Cardiac Enzymes: No results for input(s): CKTOTAL, CKMB, CKMBINDEX, TROPONINI in the last 168 hours. BNP (last 3 results) No results for input(s): PROBNP in the last 8760 hours. HbA1C: No results for input(s): HGBA1C in the last 72 hours. CBG: No results for input(s): GLUCAP in the last 168 hours. Lipid Profile: No results for input(s): CHOL, HDL, LDLCALC, TRIG, CHOLHDL, LDLDIRECT in the last 72 hours. Thyroid Function Tests: No results for input(s): TSH, T4TOTAL, FREET4, T3FREE, THYROIDAB in the last 72 hours. Anemia Panel: No results for input(s): VITAMINB12, FOLATE, FERRITIN, TIBC, IRON, RETICCTPCT in the last 72 hours. Urine analysis:    Component Value Date/Time   COLORURINE STRAW (A) 05/10/2018 1811   APPEARANCEUR CLEAR 05/10/2018 1811   LABSPEC 1.012 05/10/2018 1811   PHURINE 7.0 05/10/2018 1811   GLUCOSEU 150 (A) 05/10/2018 1811   HGBUR SMALL (A) 05/10/2018 1811   BILIRUBINUR NEGATIVE 11/03/2018 1515   KETONESUR NEGATIVE 05/10/2018 1811   PROTEINUR Positive (A) 11/03/2018 1515   PROTEINUR >=300 (A) 05/10/2018 1811   UROBILINOGEN 0.2 11/03/2018 1515   NITRITE NEGATIVE 11/03/2018 1515   NITRITE NEGATIVE 05/10/2018 1811   LEUKOCYTESUR Small (1+) (A) 11/03/2018 1515    Radiological Exams on Admission: I have personally reviewed images No results found.  EKG: I have personally reviewed EKG: NSR    Assessment/Plan Principal Problem:   Thrombus of venous dialysis catheter, initial encounter [facility]) Active Problems:   Hyperkalemia   ESRD on dialysis ([place])   OSA (obstructive sleep apnea)   Essential hypertension    Morbid obesity ([place])   Type 2 diabetes mellitus with other diabetic kidney complication (HCC)    Assessment and Plan: * Thrombus of venous dialysis catheter, initial encounter [facility])- (present on admission) Observation telemetry bed. IR to exchange out right femoral HD catheter. Pt is a vasculopathy and does not have any other  vascular access options.  Hyperkalemia Given dose of lokelma in ER.  ESRD on dialysis [facility]) Has not had HD since last Thursday. Will need HD tomorrow. Please call nephrology tomorrow morning to arrange for HD. Pt receives HD at [facility] and is followed by [employer].  Type 2 diabetes mellitus with other diabetic kidney complication ([place])- (present on admission) Stable. Add SSI.  Morbid obesity ([place])- (present on admission) Chronic.  Essential hypertension- (present on admission) stable  OSA (obstructive sleep apnea)- (present on admission) stable   DVT prophylaxis: SCDs Code Status: Full Code Family Communication: no family at bedside  Disposition Plan: return home  Consults called: IR consulted by EDP  Admission status: Observation, Telemetry bed   Kristopher Oppenheim, DO [facility] 07/15/2021, 9:46 PM

## 2021-07-15 NOTE — ED Triage Notes (Signed)
Pt arrives POV for eval of dialysis access not working. Pt is Tues/Thurs/Sat dialysis pt, went on sat and was unable to be dialyzed d/t issues w/ access being "clogged". Access is in R femoral

## 2021-07-15 NOTE — ED Notes (Signed)
Unable to get labs ?

## 2021-07-15 NOTE — Subjective & Objective (Signed)
CC: malfunction HD catheter HPI: 46 year old African-American male with a history of type 2 diabetes, hypertension, end-stage renal disease on hemodialysis since 2008, vasculopathy with limited venous access presents to the ER today with malfunctioning hemodialysis catheter.  Patient states that his last dialysis was Thursday.  He when he went to tried to get dialysis on Saturday, his dialysis catheter would not function.  He presented to the ER today for evaluation.  Patient's hemodialysis cath would not flush.  EDP is contacted interventional radiology will attempt to try to change catheter over wire tomorrow.  Patient has a long history of vasculopathy.  He is ready had both AV fistulas and AV grafts in his upper extremities which have all failed.  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  Right femoral tunneled catheter placed about 2 years ago.  This has consistently for been a problem for him and he has had to have exchanges performed frequently.  He was seen recently by vascular surgery at [facility].  By Dr. Wenda Overland.  There is a plan to try to image his venous system better with either MRI or CT.  [facility] hospitalist contacted for overnight admission so he can get his dialysis catheter exchange.  Patient will need hemodialysis tomorrow as he has not had dialysis since last Thursday.

## 2021-07-15 NOTE — Assessment & Plan Note (Signed)
stable °

## 2021-07-15 NOTE — Assessment & Plan Note (Signed)
Chronic. 

## 2021-07-15 NOTE — ED Provider Triage Note (Signed)
Emergency Medicine Provider Triage Evaluation Note  AIDEN HELZER , a 46 y.o. male  was evaluated in triage.  Pt complains of missed dialysis and vascular access problem. His last dialysis session was Thursday.  When he went on Saturday he had a clotted line and they were unable to dialyze him.  He states that he was instructed to come here but declined as he had to work. He denies any fevers.    Physical Exam  BP 136/83 (BP Location: Right Arm)    Pulse 60    Temp 98.3 F (36.8 C) (Oral)    Resp 17    Ht 6\' 2"  (1.88 m)    Wt (!) 142 kg    SpO2 98%    BMI 40.19 kg/m  Gen:   Awake, no distress   Resp:  Normal effort  MSK:   Moves extremities without difficulty  Other:  Normal speech  Medical Decision Making  Medically screening exam initiated at 5:36 PM.  Appropriate orders placed.  Erasto Sleight Walko was informed that the remainder of the evaluation will be completed by another provider, this initial triage assessment does not replace that evaluation, and the importance of remaining in the ED until their evaluation is complete.  As patient missed dialysis and has had a prolonged time since with 1 issues we will check basic labs, and EKG for possible hyperkalemia.   Lorin Glass, [place] 07/15/21 1738

## 2021-07-16 ENCOUNTER — Observation Stay (HOSPITAL_COMMUNITY): Payer: Medicare HMO

## 2021-07-16 DIAGNOSIS — T8241XA Breakdown (mechanical) of vascular dialysis catheter, initial encounter: Secondary | ICD-10-CM | POA: Diagnosis not present

## 2021-07-16 DIAGNOSIS — T82868A Thrombosis of vascular prosthetic devices, implants and grafts, initial encounter: Secondary | ICD-10-CM | POA: Diagnosis not present

## 2021-07-16 HISTORY — PX: IR FLUORO GUIDE CV LINE RIGHT: IMG2283

## 2021-07-16 HISTORY — PX: IR VENOCAVAGRAM IVC: IMG678

## 2021-07-16 LAB — COMPREHENSIVE METABOLIC PANEL
ALT: 6 U/L (ref 0–44)
AST: 9 U/L — ABNORMAL LOW (ref 15–41)
Albumin: 3.4 g/dL — ABNORMAL LOW (ref 3.5–5.0)
Alkaline Phosphatase: 29 U/L — ABNORMAL LOW (ref 38–126)
Anion gap: 21 — ABNORMAL HIGH (ref 5–15)
BUN: 87 mg/dL — ABNORMAL HIGH (ref 6–20)
CO2: 21 mmol/L — ABNORMAL LOW (ref 22–32)
Calcium: 8.7 mg/dL — ABNORMAL LOW (ref 8.9–10.3)
Chloride: 96 mmol/L — ABNORMAL LOW (ref 98–111)
Creatinine, Ser: 24.1 mg/dL — ABNORMAL HIGH (ref 0.61–1.24)
GFR, Estimated: 2 mL/min — ABNORMAL LOW (ref >60)
Glucose, Bld: 81 mg/dL (ref 70–99)
Potassium: 5.4 mmol/L — ABNORMAL HIGH (ref 3.5–5.1)
Sodium: 138 mmol/L (ref 135–145)
Total Bilirubin: 0.4 mg/dL (ref 0.3–1.2)
Total Protein: 7 g/dL (ref 6.5–8.1)

## 2021-07-16 LAB — MAGNESIUM: Magnesium: 2.9 mg/dL — ABNORMAL HIGH (ref 1.7–2.4)

## 2021-07-16 MED ORDER — HEPARIN BOLUS VIA INFUSION
5000.0000 [IU] | Freq: Once | INTRAVENOUS | Status: DC
  Filled 2021-07-16: qty 5000

## 2021-07-16 MED ORDER — ALTEPLASE 2 MG IJ SOLR: 2.0000 mg | Freq: Once | INTRAMUSCULAR | Status: DC | PRN

## 2021-07-16 MED ORDER — CEFAZOLIN SODIUM-DEXTROSE 2-4 GM/100ML-% IV SOLN
INTRAVENOUS | Status: AC | PRN
  Administered 2021-07-16: 2 g via INTRAVENOUS

## 2021-07-16 MED ORDER — LIDOCAINE HCL 1 % IJ SOLN
INTRAMUSCULAR | Status: AC
  Filled 2021-07-16: qty 20

## 2021-07-16 MED ORDER — CEFAZOLIN SODIUM-DEXTROSE 2-4 GM/100ML-% IV SOLN
INTRAVENOUS | Status: AC
  Filled 2021-07-16: qty 100

## 2021-07-16 MED ORDER — CHLORHEXIDINE GLUCONATE 4 % EX LIQD
CUTANEOUS | Status: AC
  Filled 2021-07-16: qty 15

## 2021-07-16 MED ORDER — FENTANYL CITRATE (PF) 100 MCG/2ML IJ SOLN
INTRAMUSCULAR | Status: AC | PRN
  Administered 2021-07-16: 25 ug via INTRAVENOUS

## 2021-07-16 MED ORDER — PENTAFLUOROPROP-TETRAFLUOROETH EX AERO: 1.0000 "application " | INHALATION_SPRAY | CUTANEOUS | Status: DC | PRN

## 2021-07-16 MED ORDER — CHLORHEXIDINE GLUCONATE CLOTH 2 % EX PADS: 6.0000 | MEDICATED_PAD | Freq: Every day | CUTANEOUS | Status: DC

## 2021-07-16 MED ORDER — HEPARIN (PORCINE) 25000 UT/250ML-% IV SOLN
1600.0000 [IU]/h | INTRAVENOUS | Status: DC
  Filled 2021-07-16: qty 250

## 2021-07-16 MED ORDER — LIDOCAINE HCL (PF) 1 % IJ SOLN: 5.0000 mL | INTRAMUSCULAR | Status: DC | PRN

## 2021-07-16 MED ORDER — LIDOCAINE-PRILOCAINE 2.5-2.5 % EX CREA: 1.0000 "application " | TOPICAL_CREAM | CUTANEOUS | Status: DC | PRN

## 2021-07-16 MED ORDER — SODIUM CHLORIDE 0.9 % IV SOLN: 100.0000 mL | INTRAVENOUS | Status: DC | PRN

## 2021-07-16 MED ORDER — HEPARIN SODIUM (PORCINE) 1000 UNIT/ML IJ SOLN
INTRAMUSCULAR | Status: AC
  Filled 2021-07-16: qty 10

## 2021-07-16 MED ORDER — MIDAZOLAM HCL 2 MG/2ML IJ SOLN
INTRAMUSCULAR | Status: AC | PRN
  Administered 2021-07-16: 1 mg via INTRAVENOUS

## 2021-07-16 MED ORDER — MIDAZOLAM HCL 2 MG/2ML IJ SOLN
INTRAMUSCULAR | Status: AC
  Filled 2021-07-16: qty 2

## 2021-07-16 MED ORDER — HEPARIN SODIUM (PORCINE) 1000 UNIT/ML DIALYSIS
1000.0000 [IU] | INTRAMUSCULAR | Status: DC | PRN
  Administered 2021-07-16: 6.2 [IU] via INTRAVENOUS_CENTRAL
  Filled 2021-07-16: qty 1

## 2021-07-16 MED ORDER — HEPARIN SODIUM (PORCINE) 1000 UNIT/ML DIALYSIS
6000.0000 [IU] | INTRAMUSCULAR | Status: DC | PRN
  Filled 2021-07-16 (×2): qty 6

## 2021-07-16 MED ORDER — HEPARIN SODIUM (PORCINE) 1000 UNIT/ML IJ SOLN
INTRAMUSCULAR | Status: AC | PRN
  Administered 2021-07-16: 6200 [IU] via INTRAVENOUS

## 2021-07-16 MED ORDER — CEFAZOLIN SODIUM-DEXTROSE 2-4 GM/100ML-% IV SOLN: 2.0000 g | Freq: Once | INTRAVENOUS | Status: DC

## 2021-07-16 MED ORDER — FENTANYL CITRATE (PF) 100 MCG/2ML IJ SOLN
INTRAMUSCULAR | Status: AC
  Filled 2021-07-16: qty 2

## 2021-07-16 NOTE — Progress Notes (Signed)
Patient is leaving AMA. He stated that he was told that he could leave after the completion of dialysis. MD notified. IV removed patient has all of his belongings .

## 2021-07-16 NOTE — ED Notes (Signed)
Dialysis consent at bedside

## 2021-07-16 NOTE — Progress Notes (Signed)
[facility] for heparin Indication:  thrombus within R CIV and peripheral IVC  Heparin Dosing Weight: 114.5 kg  Labs: Recent Labs    07/15/21 1829 07/16/21 0500  HGB 12.6*  --   HCT 39.5  --   PLT 194  --   CREATININE 23.55* 24.10*    Assessment: 26 yom with hx ESRD on HD presenting with thrombus within R CIV and peripheral IVC. Pharmacy consulted to dose heparin. Ok to bolus per Dr. Kurtis Bushman. Patient is not on anticoagulation PTA. Hg 12.6, plt WNL. No bleed issues reported. Patient currently in HD, first HD session since last Thursday due to access issues.  Goal of Therapy:  Heparin level 0.3-0.7 units/ml Monitor platelets by anticoagulation protocol: Yes   Plan:  Give heparin 5000 unit bolus Start heparin at 1600 units/hr Check 6hr heparin level Monitor daily CBC, s/sx bleeding   Arturo Morton, PharmD, BCPS Clinical Pharmacist 07/16/2021 4:49 PM

## 2021-07-16 NOTE — Procedures (Addendum)
Vascular and Interventional Radiology Procedure Note  Patient: [name] DOB: 1976/04/11 Medical Record Number: [id number] Note Date/Time: 07/16/21 2:53 PM   Performing Physician: Michaelle Birks, MD Assistant(s): None  Diagnosis: ESRD requiring Hemodialysis and malfunctioning femoral HD catheter  Procedure:  TUNNELED HEMODIALYSIS CATHETER REPLACEMENT  Anesthesia: Conscious Sedation Complications: None Estimated Blood Loss: Minimal Specimens:  None  Findings:  Low lying R femoral vein HD catheter, with tip at the R CIV Successful replacement for a  55  (tip-to-cuff), tunneled hemodialysis catheter with the tip of the catheter in the hepatic segment of IVC. Thrombus vs fibrin sheath within R CIV and peripheral IVC. *will contact Hospitalist Team to begin [facility]*  Plan: Catheter ready for use.  See detailed procedure note with images in PACS. The patient tolerated the procedure well without incident or complication and was returned to Floor Bed in stable condition.    Michaelle Birks, MD [facility] Radiology   Pager. Drain

## 2021-07-16 NOTE — Progress Notes (Signed)
PROGRESS NOTE    [name]  FMB:[id number] DOB: February 03, 1976 DOA: 07/15/2021 PCP: Haydee Salter, MD    Brief Narrative:  46 year old African-American male with a history of type 2 diabetes, hypertension, end-stage renal disease on hemodialysis since 2008, vasculopathy with limited venous access presents to the ER today with malfunctioning hemodialysis catheter.  Patient states that his last dialysis was Thursday.  He when he went to tried to get dialysis on Saturday, his dialysis catheter would not function.  He presented to the ER today for evaluation.  Patient's hemodialysis cath would not flush.  EDP is contacted interventional radiology will attempt to try to change catheter over wire tomorrow.  Patient has a long history of vasculopathy.  He is ready had both AV fistulas and AV grafts in his upper extremities which have all failed.  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  Right femoral tunneled catheter placed about 2 years ago.  This has consistently for been a problem for him and he has had to have exchanges performed frequently.  He was seen recently by vascular surgery at [facility].  By Dr. Wenda Overland.  There is a plan to try to image his venous system better with either MRI or CT.  [facility] contacted for overnight admission so he can get his dialysis catheter exchange.  Patient will need hemodialysis tomorrow as he has not had dialysis since last Thursday  2/7 IR was consulted, status post replacement of femoral HD catheter  Consultants:  Nephrology, IR  Procedures:   Antimicrobials:      Subjective: Has no complaints post IR procedure. Denies shortness of breath, chest pain, dizziness or lightheadedness  Objective: Vitals:   07/16/21 1544 07/16/21 1550 07/16/21 1600 07/16/21 1630  BP: (P) 132/79 (P) 132/85  129/81  Pulse: (P) 61 (P) 64 64 65  Resp:   11 14  Temp: (P) 98 F (36.7 C)     TempSrc:      SpO2:   99% 99%  Weight:      Height:        No intake or output data in the 24 hours ending 07/16/21 1706 Filed Weights   07/15/21 1515  Weight: (!) 142 kg    Examination: Calm, NAD Cta no w/r Reg s1/s2 no gallop Soft benign +bs No edema Aaoxox3  Mood and affect appropriate in current setting     Data Reviewed: I have personally reviewed following labs and imaging studies  CBC: Recent Labs  Lab 07/15/21 1829  WBC 5.5  NEUTROABS 3.1  HGB 12.6*  HCT 39.5  MCV 94.3  PLT 701   Basic Metabolic Panel: Recent Labs  Lab 07/15/21 1829 07/16/21 0500  NA 138 138  K 5.7* 5.4*  CL 97* 96*  CO2 23 21*  GLUCOSE 75 81  BUN 83* 87*  CREATININE 23.55* 24.10*  CALCIUM 9.0 8.7*  MG  --  2.9*   GFR: Estimated Creatinine Clearance: 5.7 mL/min (A) (by C-G formula based on SCr of 24.1 mg/dL (H)). Liver Function Tests: Recent Labs  Lab 07/16/21 0500  AST 9*  ALT 6  ALKPHOS 29*  BILITOT 0.4  PROT 7.0  ALBUMIN 3.4*   No results for input(s): LIPASE, AMYLASE in the last 168 hours. No results for input(s): AMMONIA in the last 168 hours. Coagulation Profile: No results for input(s): INR, PROTIME in the last 168 hours. Cardiac Enzymes: No results for input(s): CKTOTAL, CKMB, CKMBINDEX, TROPONINI in the last 168  hours. BNP (last 3 results) No results for input(s): PROBNP in the last 8760 hours. HbA1C: No results for input(s): HGBA1C in the last 72 hours. CBG: Recent Labs  Lab 07/15/21 2211  GLUCAP 116*   Lipid Profile: No results for input(s): CHOL, HDL, LDLCALC, TRIG, CHOLHDL, LDLDIRECT in the last 72 hours. Thyroid Function Tests: No results for input(s): TSH, T4TOTAL, FREET4, T3FREE, THYROIDAB in the last 72 hours. Anemia Panel: No results for input(s): VITAMINB12, FOLATE, FERRITIN, TIBC, IRON, RETICCTPCT in the last 72 hours. Sepsis Labs: No results for input(s): PROCALCITON, LATICACIDVEN in the last 168 hours.  Recent Results (from the past 240 hour(s))  Resp Panel by RT-PCR (Flu A&B, Covid)  Nasopharyngeal Swab     Status: None   Collection Time: 07/15/21  8:42 PM   Specimen: Nasopharyngeal Swab; Nasopharyngeal(NP) swabs in vial transport medium  Result Value Ref Range Status   SARS Coronavirus 2 by RT PCR NEGATIVE NEGATIVE Final    Comment: (NOTE) SARS-CoV-2 target nucleic acids are NOT DETECTED.  The SARS-CoV-2 RNA is generally detectable in upper respiratory specimens during the acute phase of infection. The lowest concentration of SARS-CoV-2 viral copies this assay can detect is 138 copies/mL. A negative result does not preclude SARS-Cov-2 infection and should not be used as the sole basis for treatment or other patient management decisions. A negative result may occur with  improper specimen collection/handling, submission of specimen other than nasopharyngeal swab, presence of viral mutation(s) within the areas targeted by this assay, and inadequate number of viral copies(<138 copies/mL). A negative result must be combined with clinical observations, patient history, and epidemiological information. The expected result is Negative.  Fact Sheet for Patients:  EntrepreneurPulse.com.au  Fact Sheet for Healthcare Providers:  IncredibleEmployment.be  This test is no t yet approved or cleared by the [place] FDA and  has been authorized for detection and/or diagnosis of SARS-CoV-2 by FDA under an Emergency Use Authorization (EUA). This EUA will remain  in effect (meaning this test can be used) for the duration of the COVID-19 declaration under Section 564(b)(1) of the Act, 21 U.S.C.section 360bbb-3(b)(1), unless the authorization is terminated  or revoked sooner.       Influenza A by PCR NEGATIVE NEGATIVE Final   Influenza B by PCR NEGATIVE NEGATIVE Final    Comment: (NOTE) The Xpert Xpress SARS-CoV-2/FLU/RSV plus assay is intended as an aid in the diagnosis of influenza from Nasopharyngeal swab specimens and should not be  used as a sole basis for treatment. Nasal washings and aspirates are unacceptable for Xpert Xpress SARS-CoV-2/FLU/RSV testing.  Fact Sheet for Patients: EntrepreneurPulse.com.au  Fact Sheet for Healthcare Providers: IncredibleEmployment.be  This test is not yet approved or cleared by the [place] FDA and has been authorized for detection and/or diagnosis of SARS-CoV-2 by FDA under an Emergency Use Authorization (EUA). This EUA will remain in effect (meaning this test can be used) for the duration of the COVID-19 declaration under Section 564(b)(1) of the Act, 21 U.S.C. section 360bbb-3(b)(1), unless the authorization is terminated or revoked.  Performed at [facility], [place] [address], [place], [place] [address]          Radiology Studies: IR Venocavagram Ivc  Result Date: 07/16/2021 INDICATION: ESRD requiring HD.  Femoral HD catheter malfunction. EXAM: Procedures: 1. INFERIOR VENA CAVAGRAM 2. TUNNELED CENTRAL VENOUS HEMODIALYSIS CATHETER REPLACEMENT WITH FLUOROSCOPIC GUIDANCE MEDICATIONS: Ancef 2 g IV FLUOROSCOPY TIME:  1 minutes 24 seconds Fluoroscopic dose: 83 mGy COMPLICATIONS: None immediate. PROCEDURE: Informed  written consent was obtained from the patient and/or patient's representative after a discussion of the risks, benefits, and alternatives to treatment. Questions regarding the procedure were encouraged and answered. The skin and external portion of the existing hemodialysis catheter was prepped with chlorhexidine in a sterile fashion, and a sterile drape was applied covering the operative field. Maximum barrier sterile technique with sterile gowns and gloves were used for the procedure. A timeout was performed prior to the initiation of the procedure. The indwelling heparin was aspirated, then through one of the lumens the RIGHT femoral hemodialysis catheter was injected and a central (IVC) venogram was performed. The  hemodialysis catheter was cannulated with a stiff Glidewire and advanced to the level of the right atrium. Under intermittent fluoroscopic guidance, the existing 33 cm dialysis catheter was exchanged for a new 55 cm tip to cuff palindrome dialysis catheter with tips ultimately terminating within the hepatic IVC segment . Final catheter positioning was confirmed and documented with a spot radiographic image. The catheter aspirates and flushes normally. The catheter was flushed with appropriate volume heparin dwells. The catheter exit site was secured with a 0-Prolene retention sutures. Both lumens were heparinized. A dressing was placed. The patient tolerated the procedure well without immediate post procedural complication. FINDINGS: 1. Central venogram demonstrating a low-positioned RIGHT femoral vein dialysis catheter, with tip at the distal common iliac vein. 2. Unexpected finding of thrombus within the RIGHT CIV and peripheral IVC, terminating distal to the renal veins. See key image 3. Question central IVC narrowing, proximal to the eustachian valve. 4. Technically successful replacement for a 55 cm hemodialysis catheter, with tip positioned at the hepatic IVC. IMPRESSION: 1. Central venogram with unexpected finding of RIGHT CIV and IVC thrombus. 2. Successful replacement for a 55 cm tip to cuff tunneled hemodialysis catheter with tips terminating at the level of the hepatic veins. The catheter is ready for immediate use. These results were called by telephone at the time of interpretation on 07/16/2021 to provider Dr. Tera Mater, who verbally acknowledged these results. PLAN: 1. The patient will require a non-emergent, outpatient CT CAP venogram "HERO" protocol to evaluate for peripheral and central venous patency for future access planning. 2. He will be seen in interval clinic for access and venous thrombus follow-up by my colleague Dr. Damita Dunnings, who is also aware of the above findings. Michaelle Birks, MD  [facility] Radiology Electronically Signed   By: Michaelle Birks M.D.   On: 07/16/2021 16:25   IR Fluoro Guide CV Line Right  Result Date: 07/16/2021 INDICATION: ESRD requiring HD.  Femoral HD catheter malfunction. EXAM: Procedures: 1. INFERIOR VENA CAVAGRAM 2. TUNNELED CENTRAL VENOUS HEMODIALYSIS CATHETER REPLACEMENT WITH FLUOROSCOPIC GUIDANCE MEDICATIONS: Ancef 2 g IV FLUOROSCOPY TIME:  1 minutes 24 seconds Fluoroscopic dose: 83 mGy COMPLICATIONS: None immediate. PROCEDURE: Informed written consent was obtained from the patient and/or patient's representative after a discussion of the risks, benefits, and alternatives to treatment. Questions regarding the procedure were encouraged and answered. The skin and external portion of the existing hemodialysis catheter was prepped with chlorhexidine in a sterile fashion, and a sterile drape was applied covering the operative field. Maximum barrier sterile technique with sterile gowns and gloves were used for the procedure. A timeout was performed prior to the initiation of the procedure. The indwelling heparin was aspirated, then through one of the lumens the RIGHT femoral hemodialysis catheter was injected and a central (IVC) venogram was performed. The hemodialysis catheter was cannulated with  a stiff Glidewire and advanced to the level of the right atrium. Under intermittent fluoroscopic guidance, the existing 33 cm dialysis catheter was exchanged for a new 55 cm tip to cuff palindrome dialysis catheter with tips ultimately terminating within the hepatic IVC segment . Final catheter positioning was confirmed and documented with a spot radiographic image. The catheter aspirates and flushes normally. The catheter was flushed with appropriate volume heparin dwells. The catheter exit site was secured with a 0-Prolene retention sutures. Both lumens were heparinized. A dressing was placed. The patient tolerated the procedure  well without immediate post procedural complication. FINDINGS: 1. Central venogram demonstrating a low-positioned RIGHT femoral vein dialysis catheter, with tip at the distal common iliac vein. 2. Unexpected finding of thrombus within the RIGHT CIV and peripheral IVC, terminating distal to the renal veins. See key image 3. Question central IVC narrowing, proximal to the eustachian valve. 4. Technically successful replacement for a 55 cm hemodialysis catheter, with tip positioned at the hepatic IVC. IMPRESSION: 1. Central venogram with unexpected finding of RIGHT CIV and IVC thrombus. 2. Successful replacement for a 55 cm tip to cuff tunneled hemodialysis catheter with tips terminating at the level of the hepatic veins. The catheter is ready for immediate use. These results were called by telephone at the time of interpretation on 07/16/2021 to provider Dr. Tera Mater, who verbally acknowledged these results. PLAN: 1. The patient will require a non-emergent, outpatient CT CAP venogram "HERO" protocol to evaluate for peripheral and central venous patency for future access planning. 2. He will be seen in interval clinic for access and venous thrombus follow-up by my colleague Dr. Damita Dunnings, who is also aware of the above findings. Michaelle Birks, MD [facility] Radiology Electronically Signed   By: Michaelle Birks M.D.   On: 07/16/2021 16:25        Scheduled Meds:  atorvastatin  40 mg Oral Daily   chlorhexidine       Chlorhexidine Gluconate Cloth  6 each Topical Q0600   fentaNYL       heparin  5,000 Units Intravenous Once   heparin sodium (porcine)       insulin aspart  0-5 Units Subcutaneous QHS   insulin aspart  0-9 Units Subcutaneous TID WC   lidocaine       midazolam       sodium zirconium cyclosilicate  10 g Oral BID   Continuous Infusions:  sodium chloride     sodium chloride     ceFAZolin      ceFAZolin (ANCEF) IV     heparin      Assessment  & Plan:   Principal Problem:   Thrombus of venous dialysis catheter, initial encounter ([place]) Active Problems:   OSA (obstructive sleep apnea)   Essential hypertension   Morbid obesity ([place])   ESRD on dialysis ([place])   Type 2 diabetes mellitus with other diabetic kidney complication ([facility])   Hyperkalemia   Thrombus of venous dialysis catheter, initial encounter ([place])- (present on admission) S/p HD exchange today Per vascular via chat, thrombus within R CIV and peripheral IVC.  Patient needs initiation with heparin drip and transition to anticoagulation. Since ESRD can DC on Eliquis We will need to follow-up with Dr. Damita Dunnings on discharge. Pharmacy consulted for initiation of hep. Gtt    Hyperkalemia Was given Lokelma x1 in ER Today going for dialysis   ESRD on dialysis [facility]) Has not had HD since last Thursday.  2/7 plan  for dialysis today      Type 2 diabetes mellitus with other diabetic kidney complication ([place])- (present on admission) BG stable continue with R-ISS   Morbid obesity ([place])- (present on admission) Chronic. May consider weight loss therapy Complicates overall px    Essential hypertension- (present on admission) stable   OSA (obstructive sleep apnea)- (present on admission) stable   DVT prophylaxis: Heparin Code Status: Full Family Communication: None at bedside Disposition Plan: Home Status is: Observation The patient remains OBS appropriate and will d/c before 2 midnights.              LOS: 0 days   Time spent: 35 minutes with more than 50% on COC    Nolberto Hanlon, MD [facility] Pager [phone]  If 7PM-7AM, please contact night-coverage 07/16/2021, 5:06 PM

## 2021-07-16 NOTE — Progress Notes (Signed)
[place] BRIEF PROGRESS NOTE  [name] 12/16/1975 [id number]  Patient is 46 year old male with ESRD on HD TTS at [facility].   Aware of patient in the ED due to non functioning tunneled dialysis catheter.  Last dialysis on 07/11/21.  Patient is catheter dependent with past history of multiple issues with dialysis catheters.  Pertinent labs include K 5.4, SCr 24, BUN 87.  Outpatient HD orders:  [facility] TTS 4.5hrs 133kg 500/500 2K 2Ca  Hect 5 qHD Parsabiv 5 qHD Hep 11400  Plan for catheter exchange today by IR.  Will write orders for HD to follow.    Jen Mow, PA-C [facility] Pager: [phone]

## 2021-07-16 NOTE — Consult Note (Addendum)
Chief Complaint: Patient was seen in consultation today for right femoral tunneled hemodialysis catheter exchange with possible intervention Chief Complaint  Patient presents with   Vascular Access Problem   at the request of L Penninger  Supervising Physician: Michaelle Birks  Patient Status: [facility] - ED  History of Present Illness: [name] is a 46 y.o. male  ESRD Last dialysis 07/11/21-- complete Attempted dialysis 2/4- unable to get any flow  Last cath exchange/intervention: 07/03/21  IMPRESSION: Status post image guided exchange of tunneled right femoral hemodialysis catheter, as well as balloon angioplasty to disrupt fibrin sheath, and placement of a new 55 cm split tip palindrome catheter. PLAN: If the current catheter does not function well and the patient returns for additional exchange, would consider use of IV thus, moderate sedation, as well as the possibility of more advanced steps such as recanalization of the upper extremity venous system.  Pt is in ED for exchange and possible intervention Has not eaten since 9 pm last night  Scheduled now for right femoral tunneled HD catheter evaluation; exchange; possible intervention   Past Medical History:  Diagnosis Date   Acute respiratory failure with hypoxia ([place]) 11/30/2018   Anemia    ESRD   ESRD on hemodialysis ([place]) 06/07/2011   TTS [place]. Started HD in 2006, got transplant in 2014 lasted until Dec 2019 then went back on HD.  Has L thigh AVG as of Jun 2020.  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    Hypertension    Morbid obesity ([place])    Paroxysmal atrial fibrillation ([place])    Sepsis ([place]) 11/30/2018   Sleep apnea     Past Surgical History:  Procedure Laterality Date   ARTERIOVENOUS GRAFT PLACEMENT  08/09/2010   Left Thigh Graft by Dr. [name]   AV FISTULA PLACEMENT Right 05/18/2018   Procedure: INSERTION OF ARTERIOVENOUS (AV) GORE-TEX GRAFT Left THIGH;  Surgeon: Waynetta Sandy, MD;  Location: [place];  Service: Vascular;  Laterality: Right;   AV FISTULA PLACEMENT Right 02/15/2021   Procedure: INSERTION OF ARTERIOVENOUS (AV) GORE-TEX LOOP GRAFT RIGHT THIGH;  Surgeon: Waynetta Sandy, MD;  Location: [place];  Service: Vascular;  Laterality: Right;   CAPD REMOVAL  05/08/2011   Procedure: CONTINUOUS AMBULATORY PERITONEAL DIALYSIS  (CAPD) CATHETER REMOVAL;  Surgeon: [name], MD;  Location: [place];  Service: General;  Laterality: N/A;  Removal of CAPD catheter, Dr. requests to go after [place]  10/05/2010   Right Femoral Cath insertion by Dr. [name].  Pt ahas had several caths inserted.   INSERTION OF DIALYSIS CATHETER Right 02/15/2021   Procedure: Attempted INSERTION OF RIGHT and Left Internal Jugular DIALYSIS CATHETER, Insertion of Left Femoral Vein Dialysis Catheter;  Surgeon: Waynetta Sandy, MD;  Location: [place];  Service: Vascular;  Laterality: Right;   INSERTION OF DIALYSIS CATHETER Right 04/26/2021   Procedure: INSERTION OF TUNNELED 55cm PALIDROME PRECISION CHRONIC DIALYSIS CATHETER;  Surgeon: Cherre Robins, MD;  Location: [place];  Service: Vascular;  Laterality: Right;   IR FLUORO GUIDE CV LINE RIGHT  05/11/2018   IR FLUORO GUIDE CV LINE RIGHT  07/03/2021   IR PTA ADDL CENTRAL DIALYSIS SEG THRU DIALY CIRCUIT RIGHT Right 07/03/2021   IR US GUIDE VASC ACCESS RIGHT  05/11/2018   KIDNEY TRANSPLANT  2014   KNEE ARTHROSCOPY Left    THROMBECTOMY W/ EMBOLECTOMY Right 04/26/2021   Procedure: REMOVAL OF  RIGHT THIGH ARTERIOVENOUS GORE-TEX GRAFT AND REPAIR OF RIGHT COMMON FEMORAL ARTERY;  Surgeon: Cherre Robins, MD;  Location: [place];  Service: Vascular;  Laterality: Right;   UPPER EXTREMITY ANGIOGRAPHY Bilateral 05/13/2018   Procedure: UPPER EXTREMITY ANGIOGRAPHY - bilarteral;  Surgeon: Marty Heck, MD;  Location: [facility];  Service: Cardiovascular;  Laterality: Bilateral;     Allergies: Patient has no known allergies.  Medications: Prior to Admission medications   Medication Sig Start Date End Date Taking? Authorizing Provider  atorvastatin (LIPITOR) 40 MG tablet Take 1 tablet (40 mg total) by mouth daily. 02/06/21  Yes Haydee Salter, MD  ferric citrate (AURYXIA) 1 GM 210 MG(Fe) tablet Take 630 mg by mouth 3 (three) times daily with meals. 09/06/19  Yes [provider]     Family History  Problem Relation Age of Onset   Diabetes Father    Stroke Father    Stroke Maternal Grandmother    Anesthesia problems Neg Hx     Social History   Socioeconomic History   Marital status: Single    Spouse name: Not on file   Number of children: 0   Years of education: Not on file   Highest education level: Not on file  Occupational History   Not on file  Tobacco Use   Smoking status: Former    Types: Cigarettes    Quit date: 11/08/2015    Years since quitting: 5.6   Smokeless tobacco: Never   Tobacco comments:    Smokes a cigarette about every 3-4 days.  Vaping Use   Vaping Use: Never used  Substance and Sexual Activity   Alcohol use: Yes    Comment: Socially   Drug use: Yes    Frequency: 10.0 times per week    Types: Marijuana    Comment: 1-2 joints per day   Sexual activity: Yes  Other Topics Concern   Not on file  Social History Narrative   Lives alone.    Social Determinants of Health   Financial Resource Strain: Not on file  Food Insecurity: Not on file  Transportation Needs: Not on file  Physical Activity: Not on file  Stress: Not on file  Social Connections: Not on file    Review of Systems: A 12 point ROS discussed and pertinent positives are indicated in the HPI above.  All other systems are negative.  Vital Signs: BP (!) 127/92    Pulse 83    Temp 98.4 F (36.9 C) (Oral)    Resp 17    Ht 6\' 2"  (1.88 m)    Wt (!) 313 lb 0.9 oz (142 kg)    SpO2 100%    BMI 40.19 kg/m   Physical Exam Vitals reviewed.  HENT:      Mouth/Throat:     Mouth: Mucous membranes are moist.  Cardiovascular:     Rate and Rhythm: Normal rate and regular rhythm.     Heart sounds: Normal heart sounds.  Pulmonary:     Effort: Pulmonary effort is normal.     Breath sounds: Normal breath sounds. No wheezing.  Abdominal:     Palpations: Abdomen is soft.  Musculoskeletal:        General: Normal range of motion.  Skin:    General: Skin is warm.     Comments: Rt femoral dialysis catheter intact  Neurological:     Mental Status: He is alert and oriented to person, place, and time.  Psychiatric:  Behavior: Behavior normal.    Imaging: IR Fluoro Guide CV Line Right  Result Date: 07/03/2021 INDICATION: 46 year old male with longstanding history of renal failure, multiple prior access. Recently in November 2022 the patient had excision of femoral loop graft and placement of a tunneled right femoral hemodialysis catheter. At the time, he was declared with no additional sites of possible access. The catheter had worked until last week when function decreased. The catheter was exchanged last week for a right femoral 55 cm tunneled hemodialysis catheter. Now the catheter is not functioning well with slow flow. He presents for outpatient exchange EXAM: IMAGE GUIDED EXCHANGE OF RIGHT FEMORAL TUNNELED HEMODIALYSIS CATHETER BALLOON ANGIOPLASTY OF FIBRIN SHEATH AND IVC CAVOGRAM MEDICATIONS: 2 g Ancef. The antibiotic was given in an appropriate time interval prior to skin puncture. 4 mg IV Zofran ANESTHESIA/SEDATION: Moderate (conscious) sedation was not employed during this procedure. Total intra-service moderate Sedation Time: 0 minutes. The patient's level of consciousness and vital signs were monitored continuously by radiology nursing throughout the procedure under my direct supervision. FLUOROSCOPY TIME:  Fluoroscopy Time: 2 minutes 36 seconds (270 mGy). COMPLICATIONS: None PROCEDURE: Informed written consent was obtained from the patient  after a discussion of the risks, benefits, and alternatives to treatment. Questions regarding the procedure were encouraged and answered. The neck and chest were prepped with chlorhexidine in a sterile fashion, and a sterile drape was applied covering the operative field. Maximum barrier sterile technique with sterile gowns and gloves were used for the procedure. A timeout was performed prior to the initiation of the procedure. Patient was positioned supine position under the image intensifier. The right thigh including the indwelling graft were prepped and draped in the usual sterile fashion. 1% lidocaine was used for local anesthesia. The heparin dwell of both of the lumen of the indwelling catheter were aspirated. Contrast was injected through the indwelling catheter demonstrating a partially occlusive fibrin sheath at the tip of the catheter with slow flow into the right atrium and reflux into the a Paddock veins. The tip of the catheter appears to terminate at the right hepatic vein right atrium confluence. A stiff Glidewire was advanced through the catheter and the catheter was removed on the wire. A new 55 cm catheter was advanced on the wire. Into position at the a [facility] IVC. The catheter would not function on aspiration. Catheter was removed on a stiff Glidewire and a 14 French sheath was placed on the wire. The introducer was removed and additional cavagram was performed demonstrating changes of additional fibrin sheath. A 14 mm balloon was then inflated at the level of the diaphragm to profile and then withdrawn through the IVC in attempt to disrupt the fibrin sheath. Balloon was removed through the 14 French sheath. Repeat Keefer g demonstrates no evidence of residual fibrin sheath at the hepatic IVC. A new 55 cm split tip catheter was placed on the Glidewire, hubbed at the skin. The red and the blue lumen were both aspirated confirming excellent function. Final catheter positioning was confirmed and  documented with a spot radiographic image. The catheter aspirates and flushes normally. The catheter was flushed with appropriate volume heparin dwells. The catheter exit site was secured with a 0-Prolene retention suture. Patient tolerated the procedure well and remained hemodynamically stable throughout. No complications were encountered and no significant blood loss encountered. IMPRESSION: Status post image guided exchange of tunneled right femoral hemodialysis catheter, as well as balloon angioplasty to disrupt fibrin sheath, and placement of a new 55  cm split tip palindrome catheter. Signed, Dulcy Fanny. Dellia Nims, RPVI [facility] Radiology PLAN: If the current catheter does not function well and the patient returns for additional exchange, would consider use of IV thus, moderate sedation, as well as the possibility of more advanced steps such as recanalization of the upper extremity venous system. Electronically Signed   By: Corrie Mckusick D.O.   On: 07/03/2021 17:28   IR PTA ADDL CENTRAL DIALYSIS SEG THRU DIALY CIRCUIT RIGHT  Result Date: 07/03/2021 INDICATION: 46 year old male with longstanding history of renal failure, multiple prior access. Recently in November 2022 the patient had excision of femoral loop graft and placement of a tunneled right femoral hemodialysis catheter. At the time, he was declared with no additional sites of possible access. The catheter had worked until last week when function decreased. The catheter was exchanged last week for a right femoral 55 cm tunneled hemodialysis catheter. Now the catheter is not functioning well with slow flow. He presents for outpatient exchange EXAM: IMAGE GUIDED EXCHANGE OF RIGHT FEMORAL TUNNELED HEMODIALYSIS CATHETER BALLOON ANGIOPLASTY OF FIBRIN SHEATH AND IVC CAVOGRAM MEDICATIONS: 2 g Ancef. The antibiotic was given in an appropriate time interval prior to skin puncture. 4 mg IV Zofran  ANESTHESIA/SEDATION: Moderate (conscious) sedation was not employed during this procedure. Total intra-service moderate Sedation Time: 0 minutes. The patient's level of consciousness and vital signs were monitored continuously by radiology nursing throughout the procedure under my direct supervision. FLUOROSCOPY TIME:  Fluoroscopy Time: 2 minutes 36 seconds (270 mGy). COMPLICATIONS: None PROCEDURE: Informed written consent was obtained from the patient after a discussion of the risks, benefits, and alternatives to treatment. Questions regarding the procedure were encouraged and answered. The neck and chest were prepped with chlorhexidine in a sterile fashion, and a sterile drape was applied covering the operative field. Maximum barrier sterile technique with sterile gowns and gloves were used for the procedure. A timeout was performed prior to the initiation of the procedure. Patient was positioned supine position under the image intensifier. The right thigh including the indwelling graft were prepped and draped in the usual sterile fashion. 1% lidocaine was used for local anesthesia. The heparin dwell of both of the lumen of the indwelling catheter were aspirated. Contrast was injected through the indwelling catheter demonstrating a partially occlusive fibrin sheath at the tip of the catheter with slow flow into the right atrium and reflux into the a Paddock veins. The tip of the catheter appears to terminate at the right hepatic vein right atrium confluence. A stiff Glidewire was advanced through the catheter and the catheter was removed on the wire. A new 55 cm catheter was advanced on the wire. Into position at the a [facility] IVC. The catheter would not function on aspiration. Catheter was removed on a stiff Glidewire and a 14 French sheath was placed on the wire. The introducer was removed and additional cavagram was performed demonstrating changes of additional fibrin sheath. A 14 mm balloon was then inflated  at the level of the diaphragm to profile and then withdrawn through the IVC in attempt to disrupt the fibrin sheath. Balloon was removed through the 14 French sheath. Repeat Keefer g demonstrates no evidence of residual fibrin sheath at the hepatic IVC. A new 55 cm split tip catheter was placed on the Glidewire, hubbed at the skin. The red and the blue lumen were both aspirated confirming excellent function. Final catheter positioning was confirmed and documented with a spot radiographic image.  The catheter aspirates and flushes normally. The catheter was flushed with appropriate volume heparin dwells. The catheter exit site was secured with a 0-Prolene retention suture. Patient tolerated the procedure well and remained hemodynamically stable throughout. No complications were encountered and no significant blood loss encountered. IMPRESSION: Status post image guided exchange of tunneled right femoral hemodialysis catheter, as well as balloon angioplasty to disrupt fibrin sheath, and placement of a new 55 cm split tip palindrome catheter. Signed, Dulcy Fanny. Dellia Nims, RPVI [facility] Radiology PLAN: If the current catheter does not function well and the patient returns for additional exchange, would consider use of IV thus, moderate sedation, as well as the possibility of more advanced steps such as recanalization of the upper extremity venous system. Electronically Signed   By: Corrie Mckusick D.O.   On: 07/03/2021 17:28    Labs:  CBC: Recent Labs    12/05/20 1021 02/06/21 1033 02/15/21 0604 04/26/21 1211 04/26/21 1930 07/15/21 1829  WBC 5.5 6.7  --   --  8.3 5.5  HGB 15.2 13.1 12.2* 14.6 11.6* 12.6*  HCT 45.6 39.7 36.0* 43.0 35.3* 39.5  PLT 207.0 192.0  --   --  190 194    COAGS: No results for input(s): INR, APTT in the last 8760 hours.  BMP: Recent Labs    12/05/20 1021 02/15/21 0604 04/26/21 1211 04/26/21 1930 07/15/21 1829  07/16/21 0500  NA 138 136 137  --  138 138  K 4.4 5.2* 5.8*  --  5.7* 5.4*  CL 93* 104 98  --  97* 96*  CO2 31  --   --   --  23 21*  GLUCOSE 93 114* 82  --  75 81  BUN 41* 69* 86*  --  83* 87*  CALCIUM 9.3  --   --   --  9.0 8.7*  CREATININE 11.36* >18.00* >18.00* 18.00* 23.55* 24.10*  GFRNONAA  --   --   --  3* 2* 2*    LIVER FUNCTION TESTS: Recent Labs    12/05/20 1021 07/16/21 0500  BILITOT 0.5 0.4  AST 11 9*  ALT 10 6  ALKPHOS 38* 29*  PROT 7.4 7.0  ALBUMIN 4.2 3.4*    TUMOR MARKERS: No results for input(s): AFPTM, CEA, CA199, CHROMGRNA in the last 8760 hours.  Assessment and Plan:  Rt femoral tunneled dialysis catheter malfunction Scheduled for evaluation/exchange/intervention Pt is aware of procedure benefits and risks including but not limited to Infection; bleeding; vessel damage Agreeable to proceed Consent signed and in IR  Thank you for this interesting consult.  I greatly enjoyed meeting ARSH FEUTZ and look forward to participating in their care.  A copy of this report was sent to the requesting provider on this date.  Electronically Signed: Lavonia Drafts, PA-C 07/16/2021, 8:39 AM   I spent a total of 20 Minutes    in face to face in clinical consultation, greater than 50% of which was counseling/coordinating care for tunneled hd cath evaluation/exchange

## 2021-07-16 NOTE — ED Notes (Addendum)
Pt refused blood sugar check stating he is not diabetic.

## 2021-07-16 NOTE — ED Notes (Signed)
CBG check refused. Pt states he is not a diabetic.

## 2021-07-16 NOTE — ED Notes (Signed)
Pt transported to IR 

## 2021-07-18 DIAGNOSIS — I871 Compression of vein: Secondary | ICD-10-CM | POA: Diagnosis not present

## 2021-07-18 DIAGNOSIS — I82290 Acute embolism and thrombosis of other thoracic veins: Secondary | ICD-10-CM | POA: Diagnosis not present

## 2021-07-18 DIAGNOSIS — N186 End stage renal disease: Secondary | ICD-10-CM | POA: Diagnosis not present

## 2021-07-18 DIAGNOSIS — N189 Chronic kidney disease, unspecified: Secondary | ICD-10-CM | POA: Diagnosis not present

## 2021-07-18 DIAGNOSIS — I82210 Acute embolism and thrombosis of superior vena cava: Secondary | ICD-10-CM | POA: Diagnosis not present

## 2021-07-18 DIAGNOSIS — I2510 Atherosclerotic heart disease of native coronary artery without angina pectoris: Secondary | ICD-10-CM | POA: Diagnosis not present

## 2021-07-19 DIAGNOSIS — N2581 Secondary hyperparathyroidism of renal origin: Secondary | ICD-10-CM | POA: Diagnosis not present

## 2021-07-19 DIAGNOSIS — N186 End stage renal disease: Secondary | ICD-10-CM | POA: Diagnosis not present

## 2021-07-19 DIAGNOSIS — Z992 Dependence on renal dialysis: Secondary | ICD-10-CM | POA: Diagnosis not present

## 2021-07-20 DIAGNOSIS — N2581 Secondary hyperparathyroidism of renal origin: Secondary | ICD-10-CM | POA: Diagnosis not present

## 2021-07-20 DIAGNOSIS — Z992 Dependence on renal dialysis: Secondary | ICD-10-CM | POA: Diagnosis not present

## 2021-07-20 DIAGNOSIS — N186 End stage renal disease: Secondary | ICD-10-CM | POA: Diagnosis not present

## 2021-07-24 DIAGNOSIS — T8249XA Other complication of vascular dialysis catheter, initial encounter: Secondary | ICD-10-CM | POA: Diagnosis not present

## 2021-07-24 DIAGNOSIS — N186 End stage renal disease: Secondary | ICD-10-CM | POA: Diagnosis not present

## 2021-07-24 DIAGNOSIS — N2581 Secondary hyperparathyroidism of renal origin: Secondary | ICD-10-CM | POA: Diagnosis not present

## 2021-07-24 DIAGNOSIS — Z992 Dependence on renal dialysis: Secondary | ICD-10-CM | POA: Diagnosis not present

## 2021-07-25 DIAGNOSIS — N186 End stage renal disease: Secondary | ICD-10-CM | POA: Diagnosis not present

## 2021-07-25 DIAGNOSIS — N2581 Secondary hyperparathyroidism of renal origin: Secondary | ICD-10-CM | POA: Diagnosis not present

## 2021-07-25 DIAGNOSIS — Z992 Dependence on renal dialysis: Secondary | ICD-10-CM | POA: Diagnosis not present

## 2021-07-27 DIAGNOSIS — N2581 Secondary hyperparathyroidism of renal origin: Secondary | ICD-10-CM | POA: Diagnosis not present

## 2021-07-27 DIAGNOSIS — N186 End stage renal disease: Secondary | ICD-10-CM | POA: Diagnosis not present

## 2021-07-27 DIAGNOSIS — Z992 Dependence on renal dialysis: Secondary | ICD-10-CM | POA: Diagnosis not present

## 2021-07-28 NOTE — Discharge Summary (Signed)
[name] FWY:[id number] DOB: [birth year] DOA: 07/15/2021  PCP: Haydee Salter, MD  Admit date: 07/15/2021 Discharge date: 07/16/21  Admitted From: home Disposition:  signed out AMA at night time    Discharge Condition:Stable CODE STATUS:full  Brief/Interim Summary: 46 year old African-American male with a history of type 2 diabetes, hypertension, end-stage renal disease on hemodialysis since 2008, vasculopathy with limited venous access presents to the ER today with malfunctioning hemodialysis catheter.  Patient states that his last dialysis was Thursday.  He when he went to tried to get dialysis on Saturday, his dialysis catheter would not function.  He presented to the ER today for evaluation.  Patient's hemodialysis cath would not flush.  EDP is contacted interventional radiology will attempt to try to change catheter over wire tomorrow.  Patient has a long history of vasculopathy.  He is ready had both AV fistulas and AV grafts in his upper extremities which have all failed.  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  Right femoral tunneled catheter placed about 2 years ago.  This has consistently for been a problem for him and he has had to have exchanges performed frequently.  He was seen recently by vascular surgery at [facility].  By Dr. Wenda Overland.  There is a plan to try to image his venous system better with either MRI or CT.  [facility] contacted for overnight admission so he can get his dialysis catheter exchange.  Patient will need hemodialysis tomorrow as he has not had dialysis since last Thursday   IR was consulted, status post replacement of femoral HD catheter.IR had consern about thrombus within R CIV and peripheral IVC . Recommended heparin gtt with transition to po a/c.  Later in the evening, patient apparently signed out AMA .  Discharge Diagnoses:  Principal Problem:   Thrombus of venous dialysis catheter, initial encounter [facility]) Active  Problems:   OSA (obstructive sleep apnea)   Essential hypertension   Morbid obesity ([place])   ESRD on dialysis ([place])   Type 2 diabetes mellitus with other diabetic kidney complication ([place])   Hyperkalemia    Discharge Instructions   Allergies as of 07/16/2021   No Known Allergies      Medication List     ASK your doctor about these medications    atorvastatin 40 MG tablet Commonly known as: LIPITOR Take 1 tablet (40 mg total) by mouth daily.   ferric citrate 1 GM 210 MG(Fe) tablet Commonly known as: AURYXIA Take 630 mg by mouth 3 (three) times daily with meals.        No Known Allergies  Consultations: IR , nephrology   Procedures/Studies: IR Venocavagram Ivc  Result Date: 07/16/2021 INDICATION: ESRD requiring HD.  Femoral HD catheter malfunction. EXAM: Procedures: 1. INFERIOR VENA CAVAGRAM 2. TUNNELED CENTRAL VENOUS HEMODIALYSIS CATHETER REPLACEMENT WITH FLUOROSCOPIC GUIDANCE MEDICATIONS: Ancef 2 g IV FLUOROSCOPY TIME:  1 minutes 24 seconds Fluoroscopic dose: 83 mGy COMPLICATIONS: None immediate. PROCEDURE: Informed written consent was obtained from the patient and/or patient's representative after a discussion of the risks, benefits, and alternatives to treatment. Questions regarding the procedure were encouraged and answered. The skin and external portion of the existing hemodialysis catheter was prepped with chlorhexidine in a sterile fashion, and a sterile drape was applied covering the operative field. Maximum barrier sterile technique with sterile gowns and gloves were used for the procedure. A timeout was performed prior to the initiation of the procedure. The indwelling heparin was aspirated, then through one  of the lumens the RIGHT femoral hemodialysis catheter was injected and a central (IVC) venogram was performed. The hemodialysis catheter was cannulated with a stiff Glidewire and advanced to the level of the right atrium. Under intermittent fluoroscopic guidance,  the existing 33 cm dialysis catheter was exchanged for a new 55 cm tip to cuff palindrome dialysis catheter with tips ultimately terminating within the hepatic IVC segment . Final catheter positioning was confirmed and documented with a spot radiographic image. The catheter aspirates and flushes normally. The catheter was flushed with appropriate volume heparin dwells. The catheter exit site was secured with a 0-Prolene retention sutures. Both lumens were heparinized. A dressing was placed. The patient tolerated the procedure well without immediate post procedural complication. FINDINGS: 1. Central venogram demonstrating a low-positioned RIGHT femoral vein dialysis catheter, with tip at the distal common iliac vein. 2. Unexpected finding of thrombus within the RIGHT CIV and peripheral IVC, terminating distal to the renal veins. See key image 3. Question central IVC narrowing, proximal to the eustachian valve. 4. Technically successful replacement for a 55 cm hemodialysis catheter, with tip positioned at the hepatic IVC. IMPRESSION: 1. Central venogram with unexpected finding of RIGHT CIV and IVC thrombus. 2. Successful replacement for a 55 cm tip to cuff tunneled hemodialysis catheter with tips terminating at the level of the hepatic veins. The catheter is ready for immediate use. These results were called by telephone at the time of interpretation on 07/16/2021 to provider Dr. Tera Mater, who verbally acknowledged these results. PLAN: 1. The patient will require a non-emergent, outpatient CT CAP venogram "HERO" protocol to evaluate for peripheral and central venous patency for future access planning. 2. He will be seen in interval clinic for access and venous thrombus follow-up by my colleague Dr. Damita Dunnings, who is also aware of the above findings. Michaelle Birks, MD [facility] Radiology Electronically Signed   By: Michaelle Birks M.D.   On: 07/16/2021 16:25   IR  Fluoro Guide CV Line Right  Result Date: 07/16/2021 INDICATION: ESRD requiring HD.  Femoral HD catheter malfunction. EXAM: Procedures: 1. INFERIOR VENA CAVAGRAM 2. TUNNELED CENTRAL VENOUS HEMODIALYSIS CATHETER REPLACEMENT WITH FLUOROSCOPIC GUIDANCE MEDICATIONS: Ancef 2 g IV FLUOROSCOPY TIME:  1 minutes 24 seconds Fluoroscopic dose: 83 mGy COMPLICATIONS: None immediate. PROCEDURE: Informed written consent was obtained from the patient and/or patient's representative after a discussion of the risks, benefits, and alternatives to treatment. Questions regarding the procedure were encouraged and answered. The skin and external portion of the existing hemodialysis catheter was prepped with chlorhexidine in a sterile fashion, and a sterile drape was applied covering the operative field. Maximum barrier sterile technique with sterile gowns and gloves were used for the procedure. A timeout was performed prior to the initiation of the procedure. The indwelling heparin was aspirated, then through one of the lumens the RIGHT femoral hemodialysis catheter was injected and a central (IVC) venogram was performed. The hemodialysis catheter was cannulated with a stiff Glidewire and advanced to the level of the right atrium. Under intermittent fluoroscopic guidance, the existing 33 cm dialysis catheter was exchanged for a new 55 cm tip to cuff palindrome dialysis catheter with tips ultimately terminating within the hepatic IVC segment . Final catheter positioning was confirmed and documented with a spot radiographic image. The catheter aspirates and flushes normally. The catheter was flushed with appropriate volume heparin dwells. The catheter exit site was secured with a 0-Prolene retention sutures. Both lumens were heparinized. A dressing was  placed. The patient tolerated the procedure well without immediate post procedural complication. FINDINGS: 1. Central venogram demonstrating a low-positioned RIGHT femoral vein dialysis  catheter, with tip at the distal common iliac vein. 2. Unexpected finding of thrombus within the RIGHT CIV and peripheral IVC, terminating distal to the renal veins. See key image 3. Question central IVC narrowing, proximal to the eustachian valve. 4. Technically successful replacement for a 55 cm hemodialysis catheter, with tip positioned at the hepatic IVC. IMPRESSION: 1. Central venogram with unexpected finding of RIGHT CIV and IVC thrombus. 2. Successful replacement for a 55 cm tip to cuff tunneled hemodialysis catheter with tips terminating at the level of the hepatic veins. The catheter is ready for immediate use. These results were called by telephone at the time of interpretation on 07/16/2021 to provider Dr. Tera Mater, who verbally acknowledged these results. PLAN: 1. The patient will require a non-emergent, outpatient CT CAP venogram "HERO" protocol to evaluate for peripheral and central venous patency for future access planning. 2. He will be seen in interval clinic for access and venous thrombus follow-up by my colleague Dr. Damita Dunnings, who is also aware of the above findings. Michaelle Birks, MD [facility] Radiology Electronically Signed   By: Michaelle Birks M.D.   On: 07/16/2021 16:25   IR Fluoro Guide CV Line Right  Result Date: 07/03/2021 INDICATION: 46 year old male with longstanding history of renal failure, multiple prior access. Recently in November 2022 the patient had excision of femoral loop graft and placement of a tunneled right femoral hemodialysis catheter. At the time, he was declared with no additional sites of possible access. The catheter had worked until last week when function decreased. The catheter was exchanged last week for a right femoral 55 cm tunneled hemodialysis catheter. Now the catheter is not functioning well with slow flow. He presents for outpatient exchange EXAM: IMAGE GUIDED EXCHANGE OF RIGHT FEMORAL TUNNELED  HEMODIALYSIS CATHETER BALLOON ANGIOPLASTY OF FIBRIN SHEATH AND IVC CAVOGRAM MEDICATIONS: 2 g Ancef. The antibiotic was given in an appropriate time interval prior to skin puncture. 4 mg IV Zofran ANESTHESIA/SEDATION: Moderate (conscious) sedation was not employed during this procedure. Total intra-service moderate Sedation Time: 0 minutes. The patient's level of consciousness and vital signs were monitored continuously by radiology nursing throughout the procedure under my direct supervision. FLUOROSCOPY TIME:  Fluoroscopy Time: 2 minutes 36 seconds (270 mGy). COMPLICATIONS: None PROCEDURE: Informed written consent was obtained from the patient after a discussion of the risks, benefits, and alternatives to treatment. Questions regarding the procedure were encouraged and answered. The neck and chest were prepped with chlorhexidine in a sterile fashion, and a sterile drape was applied covering the operative field. Maximum barrier sterile technique with sterile gowns and gloves were used for the procedure. A timeout was performed prior to the initiation of the procedure. Patient was positioned supine position under the image intensifier. The right thigh including the indwelling graft were prepped and draped in the usual sterile fashion. 1% lidocaine was used for local anesthesia. The heparin dwell of both of the lumen of the indwelling catheter were aspirated. Contrast was injected through the indwelling catheter demonstrating a partially occlusive fibrin sheath at the tip of the catheter with slow flow into the right atrium and reflux into the a Padd[place] veins. The tip of the catheter appears to terminate at the right hepatic vein right atrium confluence. A stiff Glidewire was advanced through the catheter and the catheter was removed on the wire.  A new 55 cm catheter was advanced on the wire. Into position at the a [facility] IVC. The catheter would not function on aspiration. Catheter was removed on a stiff Glidewire  and a 14 French sheath was placed on the wire. The introducer was removed and additional cavagram was performed demonstrating changes of additional fibrin sheath. A 14 mm balloon was then inflated at the level of the diaphragm to profile and then withdrawn through the IVC in attempt to disrupt the fibrin sheath. Balloon was removed through the 14 French sheath. Repeat Keefer g demonstrates no evidence of residual fibrin sheath at the hepatic IVC. A new 55 cm split tip catheter was placed on the Glidewire, hubbed at the skin. The red and the blue lumen were both aspirated confirming excellent function. Final catheter positioning was confirmed and documented with a spot radiographic image. The catheter aspirates and flushes normally. The catheter was flushed with appropriate volume heparin dwells. The catheter exit site was secured with a 0-Prolene retention suture. Patient tolerated the procedure well and remained hemodynamically stable throughout. No complications were encountered and no significant blood loss encountered. IMPRESSION: Status post image guided exchange of tunneled right femoral hemodialysis catheter, as well as balloon angioplasty to disrupt fibrin sheath, and placement of a new 55 cm split tip palindrome catheter. Signed, Dulcy Fanny. Dellia Nims, RPVI [facility] PLAN: If the current catheter does not function well and the patient returns for additional exchange, would consider use of IV thus, moderate sedation, as well as the possibility of more advanced steps such as recanalization of the upper extremity venous system. Electronically Signed   By: Corrie Mckusick D.O.   On: 07/03/2021 17:28   IR PTA ADDL CENTRAL DIALYSIS SEG THRU DIALY CIRCUIT RIGHT  Result Date: 07/03/2021 INDICATION: 46 year old male with longstanding history of renal failure, multiple prior access. Recently in November 2022 the patient had excision of femoral loop graft  and placement of a tunneled right femoral hemodialysis catheter. At the time, he was declared with no additional sites of possible access. The catheter had worked until last week when function decreased. The catheter was exchanged last week for a right femoral 55 cm tunneled hemodialysis catheter. Now the catheter is not functioning well with slow flow. He presents for outpatient exchange EXAM: IMAGE GUIDED EXCHANGE OF RIGHT FEMORAL TUNNELED HEMODIALYSIS CATHETER BALLOON ANGIOPLASTY OF FIBRIN SHEATH AND IVC CAVOGRAM MEDICATIONS: 2 g Ancef. The antibiotic was given in an appropriate time interval prior to skin puncture. 4 mg IV Zofran ANESTHESIA/SEDATION: Moderate (conscious) sedation was not employed during this procedure. Total intra-service moderate Sedation Time: 0 minutes. The patient's level of consciousness and vital signs were monitored continuously by radiology nursing throughout the procedure under my direct supervision. FLUOROSCOPY TIME:  Fluoroscopy Time: 2 minutes 36 seconds (270 mGy). COMPLICATIONS: None PROCEDURE: Informed written consent was obtained from the patient after a discussion of the risks, benefits, and alternatives to treatment. Questions regarding the procedure were encouraged and answered. The neck and chest were prepped with chlorhexidine in a sterile fashion, and a sterile drape was applied covering the operative field. Maximum barrier sterile technique with sterile gowns and gloves were used for the procedure. A timeout was performed prior to the initiation of the procedure. Patient was positioned supine position under the image intensifier. The right thigh including the indwelling graft were prepped and draped in the usual sterile fashion. 1% lidocaine was used for local anesthesia. The heparin dwell of both of  the lumen of the indwelling catheter were aspirated. Contrast was injected through the indwelling catheter demonstrating a partially occlusive fibrin sheath at the tip of the  catheter with slow flow into the right atrium and reflux into the a Paddock veins. The tip of the catheter appears to terminate at the right hepatic vein right atrium confluence. A stiff Glidewire was advanced through the catheter and the catheter was removed on the wire. A new 55 cm catheter was advanced on the wire. Into position at the a [facility] IVC. The catheter would not function on aspiration. Catheter was removed on a stiff Glidewire and a 14 French sheath was placed on the wire. The introducer was removed and additional cavagram was performed demonstrating changes of additional fibrin sheath. A 14 mm balloon was then inflated at the level of the diaphragm to profile and then withdrawn through the IVC in attempt to disrupt the fibrin sheath. Balloon was removed through the 14 French sheath. Repeat Keefer g demonstrates no evidence of residual fibrin sheath at the hepatic IVC. A new 55 cm split tip catheter was placed on the Glidewire, hubbed at the skin. The red and the blue lumen were both aspirated confirming excellent function. Final catheter positioning was confirmed and documented with a spot radiographic image. The catheter aspirates and flushes normally. The catheter was flushed with appropriate volume heparin dwells. The catheter exit site was secured with a 0-Prolene retention suture. Patient tolerated the procedure well and remained hemodynamically stable throughout. No complications were encountered and no significant blood loss encountered. IMPRESSION: Status post image guided exchange of tunneled right femoral hemodialysis catheter, as well as balloon angioplasty to disrupt fibrin sheath, and placement of a new 55 cm split tip palindrome catheter. Signed, Dulcy Fanny. Dellia Nims, RPVI [facility] Radiology PLAN: If the current catheter does not function well and the patient returns for additional exchange, would consider use of IV thus, moderate  sedation, as well as the possibility of more advanced steps such as recanalization of the upper extremity venous system. Electronically Signed   By: Corrie Mckusick D.O.   On: 07/03/2021 17:28      Subjective: Left ama  Discharge Exam: Vitals:   07/16/21 1930 07/16/21 1950  BP: (!) 144/87 (!) 144/85  Pulse: 67 70  Resp: 17 16  Temp:    SpO2: 99% 98%   Vitals:   07/16/21 1830 07/16/21 1900 07/16/21 1930 07/16/21 1950  BP: (!) 103/53 (!) 157/92 (!) 144/87 (!) 144/85  Pulse: 81 66 67 70  Resp: 13 11 17 16   Temp:      TempSrc:      SpO2: 99% 98% 99% 98%  Weight:      Height:       Was examined ealier that day    The results of significant diagnostics from this hospitalization (including imaging, microbiology, ancillary and laboratory) are listed below for reference.     Microbiology: No results found for this or any previous visit (from the past 240 hour(s)).   Labs: BNP (last 3 results) No results for input(s): BNP in the last 8760 hours. Basic Metabolic Panel: No results for input(s): NA, K, CL, CO2, GLUCOSE, BUN, CREATININE, CALCIUM, MG, PHOS in the last 168 hours. Liver Function Tests: No results for input(s): AST, ALT, ALKPHOS, BILITOT, PROT, ALBUMIN in the last 168 hours. No results for input(s): LIPASE, AMYLASE in the last 168 hours. No results for input(s): AMMONIA in the last 168 hours. CBC:  No results for input(s): WBC, NEUTROABS, HGB, HCT, MCV, PLT in the last 168 hours. Cardiac Enzymes: No results for input(s): CKTOTAL, CKMB, CKMBINDEX, TROPONINI in the last 168 hours. BNP: Invalid input(s): POCBNP CBG: No results for input(s): GLUCAP in the last 168 hours. D-Dimer No results for input(s): DDIMER in the last 72 hours. Hgb A1c No results for input(s): HGBA1C in the last 72 hours. Lipid Profile No results for input(s): CHOL, HDL, LDLCALC, TRIG, CHOLHDL, LDLDIRECT in the last 72 hours. Thyroid function studies No results for input(s): TSH, T4TOTAL,  T3FREE, THYROIDAB in the last 72 hours.  Invalid input(s): FREET3 Anemia work up No results for input(s): VITAMINB12, FOLATE, FERRITIN, TIBC, IRON, RETICCTPCT in the last 72 hours. Urinalysis    Component Value Date/Time   COLORURINE STRAW (A) 05/10/2018 1811   APPEARANCEUR CLEAR 05/10/2018 1811   LABSPEC 1.012 05/10/2018 1811   PHURINE 7.0 05/10/2018 1811   GLUCOSEU 150 (A) 05/10/2018 1811   HGBUR SMALL (A) 05/10/2018 1811   BILIRUBINUR NEGATIVE 11/03/2018 1515   KETONESUR NEGATIVE 05/10/2018 1811   PROTEINUR Positive (A) 11/03/2018 1515   PROTEINUR >=300 (A) 05/10/2018 1811   UROBILINOGEN 0.2 11/03/2018 1515   NITRITE NEGATIVE 11/03/2018 1515   NITRITE NEGATIVE 05/10/2018 1811   LEUKOCYTESUR Small (1+) (A) 11/03/2018 1515   Sepsis Labs Invalid input(s): PROCALCITONIN,  WBC,  LACTICIDVEN Microbiology No results found for this or any previous visit (from the past 240 hour(s)).   Time coordinating discharge: Over 30 minutes  SIGNED:   Nolberto Hanlon, MD  [facility] 07/28/2021, 4:59 PM Pager   If 7PM-7AM, please contact night-coverage [email or web address] Password [email or web address]

## 2021-07-29 DIAGNOSIS — N186 End stage renal disease: Secondary | ICD-10-CM | POA: Diagnosis not present

## 2021-07-30 DIAGNOSIS — N186 End stage renal disease: Secondary | ICD-10-CM | POA: Diagnosis not present

## 2021-07-30 DIAGNOSIS — Z992 Dependence on renal dialysis: Secondary | ICD-10-CM | POA: Diagnosis not present

## 2021-07-30 DIAGNOSIS — N2581 Secondary hyperparathyroidism of renal origin: Secondary | ICD-10-CM | POA: Diagnosis not present

## 2021-08-01 DIAGNOSIS — N186 End stage renal disease: Secondary | ICD-10-CM | POA: Diagnosis not present

## 2021-08-01 DIAGNOSIS — N2581 Secondary hyperparathyroidism of renal origin: Secondary | ICD-10-CM | POA: Diagnosis not present

## 2021-08-01 DIAGNOSIS — Z992 Dependence on renal dialysis: Secondary | ICD-10-CM | POA: Diagnosis not present

## 2021-08-03 DIAGNOSIS — N186 End stage renal disease: Secondary | ICD-10-CM | POA: Diagnosis not present

## 2021-08-03 DIAGNOSIS — Z992 Dependence on renal dialysis: Secondary | ICD-10-CM | POA: Diagnosis not present

## 2021-08-03 DIAGNOSIS — N2581 Secondary hyperparathyroidism of renal origin: Secondary | ICD-10-CM | POA: Diagnosis not present

## 2021-08-06 DIAGNOSIS — N186 End stage renal disease: Secondary | ICD-10-CM | POA: Diagnosis not present

## 2021-08-06 DIAGNOSIS — N041 Nephrotic syndrome with focal and segmental glomerular lesions: Secondary | ICD-10-CM | POA: Diagnosis not present

## 2021-08-06 DIAGNOSIS — Z992 Dependence on renal dialysis: Secondary | ICD-10-CM | POA: Diagnosis not present

## 2021-08-08 DIAGNOSIS — T8249XA Other complication of vascular dialysis catheter, initial encounter: Secondary | ICD-10-CM | POA: Diagnosis not present

## 2021-08-08 DIAGNOSIS — N186 End stage renal disease: Secondary | ICD-10-CM | POA: Diagnosis not present

## 2021-08-08 DIAGNOSIS — N2581 Secondary hyperparathyroidism of renal origin: Secondary | ICD-10-CM | POA: Diagnosis not present

## 2021-08-08 DIAGNOSIS — Z992 Dependence on renal dialysis: Secondary | ICD-10-CM | POA: Diagnosis not present

## 2021-08-10 DIAGNOSIS — N2581 Secondary hyperparathyroidism of renal origin: Secondary | ICD-10-CM | POA: Diagnosis not present

## 2021-08-10 DIAGNOSIS — N186 End stage renal disease: Secondary | ICD-10-CM | POA: Diagnosis not present

## 2021-08-10 DIAGNOSIS — Z992 Dependence on renal dialysis: Secondary | ICD-10-CM | POA: Diagnosis not present

## 2021-08-13 DIAGNOSIS — Z992 Dependence on renal dialysis: Secondary | ICD-10-CM | POA: Diagnosis not present

## 2021-08-13 DIAGNOSIS — N2581 Secondary hyperparathyroidism of renal origin: Secondary | ICD-10-CM | POA: Diagnosis not present

## 2021-08-13 DIAGNOSIS — N186 End stage renal disease: Secondary | ICD-10-CM | POA: Diagnosis not present

## 2021-08-14 ENCOUNTER — Ambulatory Visit (INDEPENDENT_AMBULATORY_CARE_PROVIDER_SITE_OTHER): Payer: Medicare HMO

## 2021-08-14 DIAGNOSIS — Z0000 Encounter for general adult medical examination without abnormal findings: Secondary | ICD-10-CM | POA: Diagnosis not present

## 2021-08-14 NOTE — Patient Instructions (Signed)
[name] , Thank you for taking time to come for your Medicare Wellness Visit. I appreciate your ongoing commitment to your health goals. Please review the following plan we discussed and let me know if I can assist you in the future.   Screening recommendations/referrals: Colonoscopy: patient declines at this time  Recommended yearly ophthalmology/optometry visit for glaucoma screening and checkup Recommended yearly dental visit for hygiene and checkup  Vaccinations: Influenza vaccine: declined  Pneumococcal vaccine: declined  Tdap vaccine: due  Shingles vaccine: not of age     Advanced directives: none   Conditions/risks identified: none   Next appointment: none   Preventive Care 62-64 Years, Male Preventive care refers to lifestyle choices and visits with your health care provider that can promote health and wellness. What does preventive care include? A yearly physical exam. This is also called an annual well check. Dental exams once or twice a year. Routine eye exams. Ask your health care provider how often you should have your eyes checked. Personal lifestyle choices, including: Daily care of your teeth and gums. Regular physical activity. Eating a healthy diet. Avoiding tobacco and drug use. Limiting alcohol use. Practicing safe sex. Taking low-dose aspirin every day starting at age 79. What happens during an annual well check? The services and screenings done by your health care provider during your annual well check will depend on your age, overall health, lifestyle risk factors, and family history of disease. Counseling  Your health care provider may ask you questions about your: Alcohol use. Tobacco use. Drug use. Emotional well-being. Home and relationship well-being. Sexual activity. Eating habits. Work and work Statistician. Screening  You may have the following tests or measurements: Height, weight, and BMI. Blood pressure. Lipid and cholesterol  levels. These may be checked every 5 years, or more frequently if you are over 57 years old. Skin check. Lung cancer screening. You may have this screening every year starting at age 6 if you have a 30-pack-year history of smoking and currently smoke or have quit within the past 15 years. Fecal occult blood test (FOBT) of the stool. You may have this test every year starting at age 68. Flexible sigmoidoscopy or colonoscopy. You may have a sigmoidoscopy every 5 years or a colonoscopy every 10 years starting at age 20. Prostate cancer screening. Recommendations will vary depending on your family history and other risks. Hepatitis C blood test. Hepatitis B blood test. Sexually transmitted disease (STD) testing. Diabetes screening. This is done by checking your blood sugar (glucose) after you have not eaten for a while (fasting). You may have this done every 1-3 years. Discuss your test results, treatment options, and if necessary, the need for more tests with your health care provider. Vaccines  Your health care provider may recommend certain vaccines, such as: Influenza vaccine. This is recommended every year. Tetanus, diphtheria, and acellular pertussis (Tdap, Td) vaccine. You may need a Td booster every 10 years. Zoster vaccine. You may need this after age 39. Pneumococcal 13-valent conjugate (PCV13) vaccine. You may need this if you have certain conditions and have not been vaccinated. Pneumococcal polysaccharide (PPSV23) vaccine. You may need one or two doses if you smoke cigarettes or if you have certain conditions. Talk to your health care provider about which screenings and vaccines you need and how often you need them. This information is not intended to replace advice given to you by your health care provider. Make sure you discuss any questions you have with your health care  provider. Document Released: 06/22/2015 Document Revised: 02/13/2016 Document Reviewed: 03/27/2015 Elsevier  Interactive Patient Education  2017 [place] Prevention in the Home Falls can cause injuries. They can happen to people of all ages. There are many things you can do to make your home safe and to help prevent falls. What can I do on the outside of my home? Regularly fix the edges of walkways and driveways and fix any cracks. Remove anything that might make you trip as you walk through a door, such as a raised step or threshold. Trim any bushes or trees on the path to your home. Use bright outdoor lighting. Clear any walking paths of anything that might make someone trip, such as rocks or tools. Regularly check to see if handrails are loose or broken. Make sure that both sides of any steps have handrails. Any raised decks and porches should have guardrails on the edges. Have any leaves, snow, or ice cleared regularly. Use sand or salt on walking paths during winter. Clean up any spills in your garage right away. This includes oil or grease spills. What can I do in the bathroom? Use night lights. Install grab bars by the toilet and in the tub and shower. Do not use towel bars as grab bars. Use non-skid mats or decals in the tub or shower. If you need to sit down in the shower, use a plastic, non-slip stool. Keep the floor dry. Clean up any water that spills on the floor as soon as it happens. Remove soap buildup in the tub or shower regularly. Attach bath mats securely with double-sided non-slip rug tape. Do not have throw rugs and other things on the floor that can make you trip. What can I do in the bedroom? Use night lights. Make sure that you have a light by your bed that is easy to reach. Do not use any sheets or blankets that are too big for your bed. They should not hang down onto the floor. Have a firm chair that has side arms. You can use this for support while you get dressed. Do not have throw rugs and other things on the floor that can make you trip. What can I do  in the kitchen? Clean up any spills right away. Avoid walking on wet floors. Keep items that you use a lot in easy-to-reach places. If you need to reach something above you, use a strong step stool that has a grab bar. Keep electrical cords out of the way. Do not use floor polish or wax that makes floors slippery. If you must use wax, use non-skid floor wax. Do not have throw rugs and other things on the floor that can make you trip. What can I do with my stairs? Do not leave any items on the stairs. Make sure that there are handrails on both sides of the stairs and use them. Fix handrails that are broken or loose. Make sure that handrails are as long as the stairways. Check any carpeting to make sure that it is firmly attached to the stairs. Fix any carpet that is loose or worn. Avoid having throw rugs at the top or bottom of the stairs. If you do have throw rugs, attach them to the floor with carpet tape. Make sure that you have a light switch at the top of the stairs and the bottom of the stairs. If you do not have them, ask someone to add them for you. What else can I do  to help prevent falls? Wear shoes that: Do not have high heels. Have rubber bottoms. Are comfortable and fit you well. Are closed at the toe. Do not wear sandals. If you use a stepladder: Make sure that it is fully opened. Do not climb a closed stepladder. Make sure that both sides of the stepladder are locked into place. Ask someone to hold it for you, if possible. Clearly mark and make sure that you can see: Any grab bars or handrails. First and last steps. Where the edge of each step is. Use tools that help you move around (mobility aids) if they are needed. These include: Canes. Walkers. Scooters. Crutches. Turn on the lights when you go into a dark area. Replace any light bulbs as soon as they burn out. Set up your furniture so you have a clear path. Avoid moving your furniture around. If any of your  floors are uneven, fix them. If there are any pets around you, be aware of where they are. Review your medicines with your doctor. Some medicines can make you feel dizzy. This can increase your chance of falling. Ask your doctor what other things that you can do to help prevent falls. This information is not intended to replace advice given to you by your health care provider. Make sure you discuss any questions you have with your health care provider. Document Released: 03/22/2009 Document Revised: 11/01/2015 Document Reviewed: 06/30/2014 Elsevier Interactive Patient Education  2017 [employer].

## 2021-08-14 NOTE — Progress Notes (Signed)
Subjective:   [name] is a 46 y.o. male who presents for an Initial Medicare Annual Wellness Visit.   I connected with [employer]  today by telephone and verified that I am speaking with the correct person using two identifiers. Location patient: home Location provider: work Persons participating in the virtual visit: patient, provider.   I discussed the limitations, risks, security and privacy concerns of performing an evaluation and management service by telephone and the availability of in person appointments. I also discussed with the patient that there may be a patient responsible charge related to this service. The patient expressed understanding and verbally consented to this telephonic visit.    Interactive audio and video telecommunications were attempted between this provider and patient, however failed, due to patient having technical difficulties OR patient did not have access to video capability.  We continued and completed visit with audio only.    Review of Systems     Cardiac Risk Factors include: male gender;hypertension     Objective:    Today's Vitals   There is no height or weight on file to calculate BMI.  Advanced Directives 08/14/2021 07/16/2021 07/16/2021 04/26/2021 02/15/2021 12/13/2018 11/30/2018  Does Patient Have a Medical Advance Directive? No No No No No No No  Would patient like information on creating a medical advance directive? No - Patient declined No - Patient declined - No - Patient declined No - Patient declined No - Patient declined No - Patient declined    Current Medications (verified) Outpatient Encounter Medications as of 08/14/2021  Medication Sig   atorvastatin (LIPITOR) 40 MG tablet Take 1 tablet (40 mg total) by mouth daily.   ferric citrate (AURYXIA) 1 GM 210 MG(Fe) tablet Take 630 mg by mouth 3 (three) times daily with meals.   No facility-administered encounter medications on file as of 08/14/2021.    Allergies  (verified) Patient has no known allergies.   History: Past Medical History:  Diagnosis Date   Acute respiratory failure with hypoxia ([place]) 11/30/2018   Anemia    ESRD   ESRD on hemodialysis ([place]) 06/07/2011   TTS [place]. Started HD in 2006, got transplant in 2014 lasted until Dec 2019 then went back on HD.  Has L thigh AVG as of Jun 2020.  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    Hypertension    Morbid obesity ([place])    Paroxysmal atrial fibrillation ([place])    Sepsis ([place]) 11/30/2018   Sleep apnea    Past Surgical History:  Procedure Laterality Date   ARTERIOVENOUS GRAFT PLACEMENT  08/09/2010   Left Thigh Graft by Dr. [name]   AV FISTULA PLACEMENT Right 05/18/2018   Procedure: INSERTION OF ARTERIOVENOUS (AV) GORE-TEX GRAFT Left THIGH;  Surgeon: Waynetta Sandy, MD;  Location: [facility];  Service: Vascular;  Laterality: Right;   AV FISTULA PLACEMENT Right 02/15/2021   Procedure: INSERTION OF ARTERIOVENOUS (AV) GORE-TEX LOOP GRAFT RIGHT THIGH;  Surgeon: Waynetta Sandy, MD;  Location: [facility];  Service: Vascular;  Laterality: Right;   CAPD REMOVAL  05/08/2011   Procedure: CONTINUOUS AMBULATORY PERITONEAL DIALYSIS  (CAPD) CATHETER REMOVAL;  Surgeon: [name], MD;  Location: [facility];  Service: General;  Laterality: N/A;  Removal of CAPD catheter, Dr. requests to go after [place]  10/05/2010   Right Femoral Cath insertion by Dr. [name].  Pt ahas had several caths inserted.   INSERTION OF DIALYSIS CATHETER  Right 02/15/2021   Procedure: Attempted INSERTION OF RIGHT and Left Internal Jugular DIALYSIS CATHETER, Insertion of Left Femoral Vein Dialysis Catheter;  Surgeon: Waynetta Sandy, MD;  Location: [facility];  Service: Vascular;  Laterality: Right;   INSERTION OF DIALYSIS CATHETER Right 04/26/2021   Procedure: INSERTION OF TUNNELED 55cm PALIDROME PRECISION CHRONIC DIALYSIS CATHETER;  Surgeon: Cherre Robins,  MD;  Location: [facility];  Service: Vascular;  Laterality: Right;   IR FLUORO GUIDE CV LINE RIGHT  05/11/2018   IR FLUORO GUIDE CV LINE RIGHT  07/03/2021   IR FLUORO GUIDE CV LINE RIGHT  07/16/2021   IR PTA ADDL CENTRAL DIALYSIS SEG THRU DIALY CIRCUIT RIGHT Right 07/03/2021   IR US GUIDE VASC ACCESS RIGHT  05/11/2018   IR VENOCAVAGRAM IVC  07/16/2021   KIDNEY TRANSPLANT  2014   KNEE ARTHROSCOPY Left    THROMBECTOMY W/ EMBOLECTOMY Right 04/26/2021   Procedure: REMOVAL OF RIGHT THIGH ARTERIOVENOUS GORE-TEX GRAFT AND REPAIR OF RIGHT COMMON FEMORAL ARTERY;  Surgeon: Cherre Robins, MD;  Location: [facility];  Service: Vascular;  Laterality: Right;   UPPER EXTREMITY ANGIOGRAPHY Bilateral 05/13/2018   Procedure: UPPER EXTREMITY ANGIOGRAPHY - bilarteral;  Surgeon: Marty Heck, MD;  Location: [facility];  Service: Cardiovascular;  Laterality: Bilateral;   Family History  Problem Relation Age of Onset   Diabetes Father    Stroke Father    Stroke Maternal Grandmother    Anesthesia problems Neg Hx    Social History   Socioeconomic History   Marital status: Single    Spouse name: Not on file   Number of children: 0   Years of education: Not on file   Highest education level: Not on file  Occupational History   Not on file  Tobacco Use   Smoking status: Former    Types: Cigarettes    Quit date: 11/08/2015    Years since quitting: 5.7   Smokeless tobacco: Never   Tobacco comments:    Smokes a cigarette about every 3-4 days.  Vaping Use   Vaping Use: Never used  Substance and Sexual Activity   Alcohol use: Yes    Comment: Socially   Drug use: Yes    Frequency: 10.0 times per week    Types: Marijuana    Comment: 1-2 joints per day   Sexual activity: Yes  Other Topics Concern   Not on file  Social History Narrative   Lives alone.    Social Determinants of Health   Financial Resource Strain: Low Risk    Difficulty of Paying Living Expenses: Not hard at all  Food Insecurity:  No Food Insecurity   Worried About Charity fundraiser in the Last Year: Never true   [place] in the Last Year: Never true  Transportation Needs: No Transportation Needs   Lack of Transportation (Medical): No   Lack of Transportation (Non-Medical): No  Physical Activity: Inactive   Days of Exercise per Week: 0 days   Minutes of Exercise per Session: 0 min  Stress: No Stress Concern Present   Feeling of Stress : Not at all  Social Connections: Socially Isolated   Frequency of Communication with Friends and Family: Twice a week   Frequency of Social Gatherings with Friends and Family: Twice a week   Attends Religious Services: Never   Marine scientist or Organizations: No   Attends Archivist Meetings: Never   Marital Status: Never married  Tobacco Counseling Counseling given: Not Answered Tobacco comments: Smokes a cigarette about every 3-4 days.   Clinical Intake:  Pre-visit preparation completed: Yes  Pain : No/denies pain     Nutritional Risks: None Diabetes: No  How often do you need to have someone help you when you read instructions, pamphlets, or other written materials from your doctor or pharmacy?: 1 - Never What is the last grade level you completed in school?: [place]?: No  Information entered by :: l.Kaniesha Barile,LPN   Activities of Daily Living In your present state of health, do you have any difficulty performing the following activities: 08/14/2021 07/16/2021  Hearing? N N  Vision? N N  Difficulty concentrating or making decisions? N N  Walking or climbing stairs? N N  Dressing or bathing? N N  Doing errands, shopping? N N  Preparing Food and eating ? N -  Using the Toilet? N -  In the past six months, have you accidently leaked urine? N -  Do you have problems with loss of bowel control? N -  Managing your Medications? N -  Managing your Finances? N -  Housekeeping or managing your  Housekeeping? N -  Some recent data might be hidden    Patient Care Team: Haydee Salter, MD as PCP - General (Family Medicine) Sanda Klein, MD as PCP - Cardiology (Cardiology) Azzie Glatter, MD (Internal Medicine) [facility], [facility], [place] Dom, MD as Consulting Physician (Vascular Surgery) Croitoru, Dani Gobble, MD as Consulting Physician (Cardiology)  Indicate any recent Medical Services you may have received from other than [facility] providers in the past year (date may be approximate).     Assessment:   This is a routine wellness examination for Maksymilian.  Hearing/Vision screen Vision Screening - Comments:: Annual eye exams wear glasses   Dietary issues and exercise activities discussed: Current Exercise Habits: The patient does not participate in regular exercise at present, Exercise limited by: Other - see comments (on dyalisis)   Goals Addressed   None    Depression Screen PHQ 2/9 Scores 08/14/2021 08/14/2021 12/05/2020 01/25/2018  PHQ - 2 Score 0 0 0 0    Fall Risk Fall Risk  08/14/2021 04/11/2019 01/25/2018  Falls in the past year? 0 0 No  Number falls in past yr: 0 - -  Injury with Fall? 0 - -  Risk for fall due to : No Fall Risks - -  Follow up Falls evaluation completed - -    FALL RISK PREVENTION PERTAINING TO THE HOME:  Any stairs in or around the home? No  If so, are there any without handrails? No  Home free of loose throw rugs in walkways, pet beds, electrical cords, etc? Yes  Adequate lighting in your home to reduce risk of falls? Yes   ASSISTIVE DEVICES UTILIZED TO PREVENT FALLS:  Life alert? No  Use of a cane, walker or w/c? No  Grab bars in the bathroom? No  Shower chair or bench in shower? No  Elevated toilet seat or a handicapped toilet? No     Cognitive Function:    Normal cognitive status assessed by direct observation by this Nurse Health Advisor. No abnormalities found.      Immunizations Immunization History   Administered Date(s) Administered   Influenza Split 03/10/2011   Influenza, Seasonal, Injecte, Preservative Fre 03/17/2013   Influenza,inj,Quad PF,6+ Mos 04/20/2014   Influenza-Unspecified 03/22/2012   Pneumococcal Conjugate-13 04/20/2014   Pneumococcal Polysaccharide-23 04/08/2012  TDAP status: Due, Education has been provided regarding the importance of this vaccine. Advised may receive this vaccine at local pharmacy or Health Dept. Aware to provide a copy of the vaccination record if obtained from local pharmacy or Health Dept. Verbalized acceptance and understanding.  Flu Vaccine status: Due, Education has been provided regarding the importance of this vaccine. Advised may receive this vaccine at local pharmacy or Health Dept. Aware to provide a copy of the vaccination record if obtained from local pharmacy or Health Dept. Verbalized acceptance and understanding.  Pneumococcal vaccine status: Due, Education has been provided regarding the importance of this vaccine. Advised may receive this vaccine at local pharmacy or Health Dept. Aware to provide a copy of the vaccination record if obtained from local pharmacy or Health Dept. Verbalized acceptance and understanding.  Covid-19 vaccine status: Declined, Education has been provided regarding the importance of this vaccine but patient still declined. Advised may receive this vaccine at local pharmacy or Health Dept.or vaccine clinic. Aware to provide a copy of the vaccination record if obtained from local pharmacy or Health Dept. Verbalized acceptance and understanding.  Qualifies for Shingles Vaccine? No   Zostavax completed No   Shingrix Completed?: No.    Education has been provided regarding the importance of this vaccine. Patient has been advised to call insurance company to determine out of pocket expense if they have not yet received this vaccine. Advised may also receive vaccine at local pharmacy or Health Dept. Verbalized acceptance  and understanding.  Screening Tests Health Maintenance  Topic Date Due   COVID-19 Vaccine (1) Never done   FOOT EXAM  Never done   URINE MICROALBUMIN  Never done   Hepatitis C Screening  Never done   OPHTHALMOLOGY EXAM  05/19/2019   COLONOSCOPY (Pts 45-54yr Insurance coverage will need to be confirmed)  Never done   INFLUENZA VACCINE  01/07/2021   HEMOGLOBIN A1C  06/06/2021   TETANUS/TDAP  05/23/2028   HIV Screening  Completed   HPV VACCINES  Aged Out    Health Maintenance  Health Maintenance Due  Topic Date Due   COVID-19 Vaccine (1) Never done   FOOT EXAM  Never done   URINE MICROALBUMIN  Never done   Hepatitis C Screening  Never done   OPHTHALMOLOGY EXAM  05/19/2019   COLONOSCOPY (Pts 45-494yrInsurance coverage will need to be confirmed)  Never done   INFLUENZA VACCINE  01/07/2021   HEMOGLOBIN A1C  06/06/2021    Colorectal cancer screening: No longer required.   Lung Cancer Screening: (Low Dose CT Chest recommended if Age 47-80ears, 30 pack-year currently smoking OR have quit w/in 15years.) does not qualify.   Lung Cancer Screening Referral: n/a  Additional Screening:  Hepatitis C Screening: does not qualify;  Vision Screening: Recommended annual ophthalmology exams for early detection of glaucoma and other disorders of the eye. Is the patient up to date with their annual eye exam?  No  Who is the provider or what is the name of the office in which the patient attends annual eye exams? Patient will call and schedule  If pt is not established with a provider, would they like to be referred to a provider to establish care? No .   Dental Screening: Recommended annual dental exams for proper oral hygiene  Community Resource Referral / Chronic Care Management: CRR required this visit?  No   CCM required this visit?  No      Plan:     I  have personally reviewed and noted the following in the patients chart:   Medical and social history Use of alcohol,  tobacco or illicit drugs  Current medications and supplements including opioid prescriptions. Patient is not currently taking opioid prescriptions. Functional ability and status Nutritional status Physical activity Advanced directives List of other physicians Hospitalizations, surgeries, and ER visits in previous 12 months Vitals Screenings to include cognitive, depression, and falls Referrals and appointments  In addition, I have reviewed and discussed with patient certain preventive protocols, quality metrics, and best practice recommendations. A written personalized care plan for preventive services as well as general preventive health recommendations were provided to patient.     Randel Pigg, LPN   08/15/1015   Nurse Notes: none

## 2021-08-15 DIAGNOSIS — Z992 Dependence on renal dialysis: Secondary | ICD-10-CM | POA: Diagnosis not present

## 2021-08-15 DIAGNOSIS — N2581 Secondary hyperparathyroidism of renal origin: Secondary | ICD-10-CM | POA: Diagnosis not present

## 2021-08-15 DIAGNOSIS — N186 End stage renal disease: Secondary | ICD-10-CM | POA: Diagnosis not present

## 2021-08-17 DIAGNOSIS — Z992 Dependence on renal dialysis: Secondary | ICD-10-CM | POA: Diagnosis not present

## 2021-08-17 DIAGNOSIS — N186 End stage renal disease: Secondary | ICD-10-CM | POA: Diagnosis not present

## 2021-08-17 DIAGNOSIS — N2581 Secondary hyperparathyroidism of renal origin: Secondary | ICD-10-CM | POA: Diagnosis not present

## 2021-08-20 DIAGNOSIS — N2581 Secondary hyperparathyroidism of renal origin: Secondary | ICD-10-CM | POA: Diagnosis not present

## 2021-08-20 DIAGNOSIS — N186 End stage renal disease: Secondary | ICD-10-CM | POA: Diagnosis not present

## 2021-08-20 DIAGNOSIS — Z992 Dependence on renal dialysis: Secondary | ICD-10-CM | POA: Diagnosis not present

## 2021-08-22 DIAGNOSIS — N2581 Secondary hyperparathyroidism of renal origin: Secondary | ICD-10-CM | POA: Diagnosis not present

## 2021-08-22 DIAGNOSIS — Z992 Dependence on renal dialysis: Secondary | ICD-10-CM | POA: Diagnosis not present

## 2021-08-22 DIAGNOSIS — N186 End stage renal disease: Secondary | ICD-10-CM | POA: Diagnosis not present

## 2021-08-24 DIAGNOSIS — N186 End stage renal disease: Secondary | ICD-10-CM | POA: Diagnosis not present

## 2021-08-24 DIAGNOSIS — N2581 Secondary hyperparathyroidism of renal origin: Secondary | ICD-10-CM | POA: Diagnosis not present

## 2021-08-24 DIAGNOSIS — Z992 Dependence on renal dialysis: Secondary | ICD-10-CM | POA: Diagnosis not present

## 2021-08-27 DIAGNOSIS — N2581 Secondary hyperparathyroidism of renal origin: Secondary | ICD-10-CM | POA: Diagnosis not present

## 2021-08-27 DIAGNOSIS — N186 End stage renal disease: Secondary | ICD-10-CM | POA: Diagnosis not present

## 2021-08-27 DIAGNOSIS — Z992 Dependence on renal dialysis: Secondary | ICD-10-CM | POA: Diagnosis not present

## 2021-08-29 DIAGNOSIS — N2581 Secondary hyperparathyroidism of renal origin: Secondary | ICD-10-CM | POA: Diagnosis not present

## 2021-08-29 DIAGNOSIS — Z992 Dependence on renal dialysis: Secondary | ICD-10-CM | POA: Diagnosis not present

## 2021-08-29 DIAGNOSIS — N186 End stage renal disease: Secondary | ICD-10-CM | POA: Diagnosis not present

## 2021-08-31 DIAGNOSIS — Z992 Dependence on renal dialysis: Secondary | ICD-10-CM | POA: Diagnosis not present

## 2021-08-31 DIAGNOSIS — N2581 Secondary hyperparathyroidism of renal origin: Secondary | ICD-10-CM | POA: Diagnosis not present

## 2021-08-31 DIAGNOSIS — N186 End stage renal disease: Secondary | ICD-10-CM | POA: Diagnosis not present

## 2021-09-03 DIAGNOSIS — N2581 Secondary hyperparathyroidism of renal origin: Secondary | ICD-10-CM | POA: Diagnosis not present

## 2021-09-03 DIAGNOSIS — N186 End stage renal disease: Secondary | ICD-10-CM | POA: Diagnosis not present

## 2021-09-03 DIAGNOSIS — Z992 Dependence on renal dialysis: Secondary | ICD-10-CM | POA: Diagnosis not present

## 2021-09-04 DIAGNOSIS — N186 End stage renal disease: Secondary | ICD-10-CM | POA: Diagnosis not present

## 2021-09-04 DIAGNOSIS — Z992 Dependence on renal dialysis: Secondary | ICD-10-CM | POA: Diagnosis not present

## 2021-09-04 DIAGNOSIS — T8249XA Other complication of vascular dialysis catheter, initial encounter: Secondary | ICD-10-CM | POA: Diagnosis not present

## 2021-09-05 DIAGNOSIS — N2581 Secondary hyperparathyroidism of renal origin: Secondary | ICD-10-CM | POA: Diagnosis not present

## 2021-09-05 DIAGNOSIS — N186 End stage renal disease: Secondary | ICD-10-CM | POA: Diagnosis not present

## 2021-09-05 DIAGNOSIS — Z992 Dependence on renal dialysis: Secondary | ICD-10-CM | POA: Diagnosis not present

## 2021-09-06 DIAGNOSIS — Z992 Dependence on renal dialysis: Secondary | ICD-10-CM | POA: Diagnosis not present

## 2021-09-06 DIAGNOSIS — E669 Obesity, unspecified: Secondary | ICD-10-CM | POA: Diagnosis not present

## 2021-09-06 DIAGNOSIS — I120 Hypertensive chronic kidney disease with stage 5 chronic kidney disease or end stage renal disease: Secondary | ICD-10-CM | POA: Diagnosis not present

## 2021-09-06 DIAGNOSIS — E1122 Type 2 diabetes mellitus with diabetic chronic kidney disease: Secondary | ICD-10-CM | POA: Diagnosis not present

## 2021-09-06 DIAGNOSIS — N186 End stage renal disease: Secondary | ICD-10-CM | POA: Diagnosis not present

## 2021-09-06 DIAGNOSIS — Z6839 Body mass index (BMI) 39.0-39.9, adult: Secondary | ICD-10-CM | POA: Diagnosis not present

## 2021-09-06 DIAGNOSIS — I10 Essential (primary) hypertension: Secondary | ICD-10-CM | POA: Diagnosis not present

## 2021-09-06 DIAGNOSIS — G4730 Sleep apnea, unspecified: Secondary | ICD-10-CM | POA: Diagnosis not present

## 2021-09-06 DIAGNOSIS — T82590A Other mechanical complication of surgically created arteriovenous fistula, initial encounter: Secondary | ICD-10-CM | POA: Diagnosis not present

## 2021-09-06 DIAGNOSIS — G4733 Obstructive sleep apnea (adult) (pediatric): Secondary | ICD-10-CM | POA: Diagnosis not present

## 2021-09-06 DIAGNOSIS — N041 Nephrotic syndrome with focal and segmental glomerular lesions: Secondary | ICD-10-CM | POA: Diagnosis not present

## 2021-09-07 DIAGNOSIS — N2581 Secondary hyperparathyroidism of renal origin: Secondary | ICD-10-CM | POA: Diagnosis not present

## 2021-09-07 DIAGNOSIS — N186 End stage renal disease: Secondary | ICD-10-CM | POA: Diagnosis not present

## 2021-09-07 DIAGNOSIS — Z992 Dependence on renal dialysis: Secondary | ICD-10-CM | POA: Diagnosis not present

## 2021-09-10 DIAGNOSIS — Z992 Dependence on renal dialysis: Secondary | ICD-10-CM | POA: Diagnosis not present

## 2021-09-10 DIAGNOSIS — N186 End stage renal disease: Secondary | ICD-10-CM | POA: Diagnosis not present

## 2021-09-10 DIAGNOSIS — N2581 Secondary hyperparathyroidism of renal origin: Secondary | ICD-10-CM | POA: Diagnosis not present

## 2021-09-12 DIAGNOSIS — Z992 Dependence on renal dialysis: Secondary | ICD-10-CM | POA: Diagnosis not present

## 2021-09-12 DIAGNOSIS — N2581 Secondary hyperparathyroidism of renal origin: Secondary | ICD-10-CM | POA: Diagnosis not present

## 2021-09-12 DIAGNOSIS — N186 End stage renal disease: Secondary | ICD-10-CM | POA: Diagnosis not present

## 2021-09-16 DIAGNOSIS — N186 End stage renal disease: Secondary | ICD-10-CM | POA: Diagnosis not present

## 2021-09-16 DIAGNOSIS — T8249XA Other complication of vascular dialysis catheter, initial encounter: Secondary | ICD-10-CM | POA: Diagnosis not present

## 2021-09-16 DIAGNOSIS — Z992 Dependence on renal dialysis: Secondary | ICD-10-CM | POA: Diagnosis not present

## 2021-09-17 DIAGNOSIS — N186 End stage renal disease: Secondary | ICD-10-CM | POA: Diagnosis not present

## 2021-09-17 DIAGNOSIS — N2581 Secondary hyperparathyroidism of renal origin: Secondary | ICD-10-CM | POA: Diagnosis not present

## 2021-09-17 DIAGNOSIS — Z992 Dependence on renal dialysis: Secondary | ICD-10-CM | POA: Diagnosis not present

## 2021-09-19 DIAGNOSIS — Z992 Dependence on renal dialysis: Secondary | ICD-10-CM | POA: Diagnosis not present

## 2021-09-19 DIAGNOSIS — N186 End stage renal disease: Secondary | ICD-10-CM | POA: Diagnosis not present

## 2021-09-19 DIAGNOSIS — N2581 Secondary hyperparathyroidism of renal origin: Secondary | ICD-10-CM | POA: Diagnosis not present

## 2021-09-21 DIAGNOSIS — N186 End stage renal disease: Secondary | ICD-10-CM | POA: Diagnosis not present

## 2021-09-21 DIAGNOSIS — N2581 Secondary hyperparathyroidism of renal origin: Secondary | ICD-10-CM | POA: Diagnosis not present

## 2021-09-21 DIAGNOSIS — Z992 Dependence on renal dialysis: Secondary | ICD-10-CM | POA: Diagnosis not present

## 2021-09-24 DIAGNOSIS — N186 End stage renal disease: Secondary | ICD-10-CM | POA: Diagnosis not present

## 2021-09-24 DIAGNOSIS — Z992 Dependence on renal dialysis: Secondary | ICD-10-CM | POA: Diagnosis not present

## 2021-09-24 DIAGNOSIS — N2581 Secondary hyperparathyroidism of renal origin: Secondary | ICD-10-CM | POA: Diagnosis not present

## 2021-09-27 DIAGNOSIS — T8249XA Other complication of vascular dialysis catheter, initial encounter: Secondary | ICD-10-CM | POA: Diagnosis not present

## 2021-09-27 DIAGNOSIS — Z992 Dependence on renal dialysis: Secondary | ICD-10-CM | POA: Diagnosis not present

## 2021-09-27 DIAGNOSIS — N186 End stage renal disease: Secondary | ICD-10-CM | POA: Diagnosis not present

## 2021-09-28 DIAGNOSIS — N186 End stage renal disease: Secondary | ICD-10-CM | POA: Diagnosis not present

## 2021-09-28 DIAGNOSIS — N2581 Secondary hyperparathyroidism of renal origin: Secondary | ICD-10-CM | POA: Diagnosis not present

## 2021-09-28 DIAGNOSIS — Z992 Dependence on renal dialysis: Secondary | ICD-10-CM | POA: Diagnosis not present

## 2021-10-01 DIAGNOSIS — E875 Hyperkalemia: Secondary | ICD-10-CM | POA: Diagnosis not present

## 2021-10-01 DIAGNOSIS — T8241XA Breakdown (mechanical) of vascular dialysis catheter, initial encounter: Secondary | ICD-10-CM | POA: Diagnosis not present

## 2021-10-01 DIAGNOSIS — E872 Acidosis, unspecified: Secondary | ICD-10-CM | POA: Diagnosis not present

## 2021-10-01 DIAGNOSIS — Z452 Encounter for adjustment and management of vascular access device: Secondary | ICD-10-CM | POA: Diagnosis not present

## 2021-10-01 DIAGNOSIS — T827XXS Infection and inflammatory reaction due to other cardiac and vascular devices, implants and grafts, sequela: Secondary | ICD-10-CM | POA: Diagnosis not present

## 2021-10-01 DIAGNOSIS — T80211A Bloodstream infection due to central venous catheter, initial encounter: Secondary | ICD-10-CM | POA: Diagnosis not present

## 2021-10-01 DIAGNOSIS — Y832 Surgical operation with anastomosis, bypass or graft as the cause of abnormal reaction of the patient, or of later complication, without mention of misadventure at the time of the procedure: Secondary | ICD-10-CM | POA: Diagnosis not present

## 2021-10-01 DIAGNOSIS — A4102 Sepsis due to Methicillin resistant Staphylococcus aureus: Secondary | ICD-10-CM | POA: Diagnosis not present

## 2021-10-01 DIAGNOSIS — R7881 Bacteremia: Secondary | ICD-10-CM | POA: Diagnosis not present

## 2021-10-01 DIAGNOSIS — Z8639 Personal history of other endocrine, nutritional and metabolic disease: Secondary | ICD-10-CM | POA: Diagnosis not present

## 2021-10-01 DIAGNOSIS — T8612 Kidney transplant failure: Secondary | ICD-10-CM | POA: Diagnosis not present

## 2021-10-01 DIAGNOSIS — B9562 Methicillin resistant Staphylococcus aureus infection as the cause of diseases classified elsewhere: Secondary | ICD-10-CM | POA: Diagnosis not present

## 2021-10-01 DIAGNOSIS — N041 Nephrotic syndrome with focal and segmental glomerular lesions: Secondary | ICD-10-CM | POA: Diagnosis not present

## 2021-10-01 DIAGNOSIS — T82898A Other specified complication of vascular prosthetic devices, implants and grafts, initial encounter: Secondary | ICD-10-CM | POA: Diagnosis not present

## 2021-10-01 DIAGNOSIS — Z940 Kidney transplant status: Secondary | ICD-10-CM | POA: Diagnosis not present

## 2021-10-01 DIAGNOSIS — T827XXA Infection and inflammatory reaction due to other cardiac and vascular devices, implants and grafts, initial encounter: Secondary | ICD-10-CM | POA: Diagnosis not present

## 2021-10-01 DIAGNOSIS — N186 End stage renal disease: Secondary | ICD-10-CM | POA: Diagnosis not present

## 2021-10-01 DIAGNOSIS — T8249XA Other complication of vascular dialysis catheter, initial encounter: Secondary | ICD-10-CM | POA: Diagnosis not present

## 2021-10-01 DIAGNOSIS — Z4901 Encounter for fitting and adjustment of extracorporeal dialysis catheter: Secondary | ICD-10-CM | POA: Diagnosis not present

## 2021-10-01 DIAGNOSIS — I120 Hypertensive chronic kidney disease with stage 5 chronic kidney disease or end stage renal disease: Secondary | ICD-10-CM | POA: Diagnosis not present

## 2021-10-01 DIAGNOSIS — D631 Anemia in chronic kidney disease: Secondary | ICD-10-CM | POA: Diagnosis not present

## 2021-10-01 DIAGNOSIS — Z992 Dependence on renal dialysis: Secondary | ICD-10-CM | POA: Diagnosis not present

## 2021-10-02 DIAGNOSIS — T827XXS Infection and inflammatory reaction due to other cardiac and vascular devices, implants and grafts, sequela: Secondary | ICD-10-CM | POA: Diagnosis not present

## 2021-10-02 DIAGNOSIS — N186 End stage renal disease: Secondary | ICD-10-CM | POA: Diagnosis not present

## 2021-10-02 DIAGNOSIS — Z992 Dependence on renal dialysis: Secondary | ICD-10-CM | POA: Diagnosis not present

## 2021-10-02 DIAGNOSIS — E875 Hyperkalemia: Secondary | ICD-10-CM | POA: Diagnosis not present

## 2021-10-02 DIAGNOSIS — T8249XA Other complication of vascular dialysis catheter, initial encounter: Secondary | ICD-10-CM | POA: Diagnosis not present

## 2021-10-02 DIAGNOSIS — R7881 Bacteremia: Secondary | ICD-10-CM | POA: Diagnosis not present

## 2021-10-02 DIAGNOSIS — B9562 Methicillin resistant Staphylococcus aureus infection as the cause of diseases classified elsewhere: Secondary | ICD-10-CM | POA: Diagnosis not present

## 2021-10-02 DIAGNOSIS — A4102 Sepsis due to Methicillin resistant Staphylococcus aureus: Secondary | ICD-10-CM | POA: Diagnosis not present

## 2021-10-02 DIAGNOSIS — Z8639 Personal history of other endocrine, nutritional and metabolic disease: Secondary | ICD-10-CM | POA: Diagnosis not present

## 2021-10-02 DIAGNOSIS — T827XXA Infection and inflammatory reaction due to other cardiac and vascular devices, implants and grafts, initial encounter: Secondary | ICD-10-CM | POA: Diagnosis not present

## 2021-10-03 DIAGNOSIS — A4102 Sepsis due to Methicillin resistant Staphylococcus aureus: Secondary | ICD-10-CM | POA: Diagnosis not present

## 2021-10-03 DIAGNOSIS — T8249XA Other complication of vascular dialysis catheter, initial encounter: Secondary | ICD-10-CM | POA: Diagnosis not present

## 2021-10-03 DIAGNOSIS — R7881 Bacteremia: Secondary | ICD-10-CM | POA: Diagnosis not present

## 2021-10-03 DIAGNOSIS — Z992 Dependence on renal dialysis: Secondary | ICD-10-CM | POA: Diagnosis not present

## 2021-10-03 DIAGNOSIS — N186 End stage renal disease: Secondary | ICD-10-CM | POA: Diagnosis not present

## 2021-10-03 DIAGNOSIS — T827XXS Infection and inflammatory reaction due to other cardiac and vascular devices, implants and grafts, sequela: Secondary | ICD-10-CM | POA: Diagnosis not present

## 2021-10-03 DIAGNOSIS — B9562 Methicillin resistant Staphylococcus aureus infection as the cause of diseases classified elsewhere: Secondary | ICD-10-CM | POA: Diagnosis not present

## 2021-10-03 DIAGNOSIS — Z8639 Personal history of other endocrine, nutritional and metabolic disease: Secondary | ICD-10-CM | POA: Diagnosis not present

## 2021-10-03 DIAGNOSIS — E875 Hyperkalemia: Secondary | ICD-10-CM | POA: Diagnosis not present

## 2021-10-04 DIAGNOSIS — B9562 Methicillin resistant Staphylococcus aureus infection as the cause of diseases classified elsewhere: Secondary | ICD-10-CM | POA: Diagnosis not present

## 2021-10-04 DIAGNOSIS — Z452 Encounter for adjustment and management of vascular access device: Secondary | ICD-10-CM | POA: Diagnosis not present

## 2021-10-04 DIAGNOSIS — N186 End stage renal disease: Secondary | ICD-10-CM | POA: Diagnosis not present

## 2021-10-04 DIAGNOSIS — Z992 Dependence on renal dialysis: Secondary | ICD-10-CM | POA: Diagnosis not present

## 2021-10-04 DIAGNOSIS — T827XXA Infection and inflammatory reaction due to other cardiac and vascular devices, implants and grafts, initial encounter: Secondary | ICD-10-CM | POA: Diagnosis not present

## 2021-10-04 DIAGNOSIS — E875 Hyperkalemia: Secondary | ICD-10-CM | POA: Diagnosis not present

## 2021-10-04 DIAGNOSIS — T827XXS Infection and inflammatory reaction due to other cardiac and vascular devices, implants and grafts, sequela: Secondary | ICD-10-CM | POA: Diagnosis not present

## 2021-10-04 DIAGNOSIS — R7881 Bacteremia: Secondary | ICD-10-CM | POA: Diagnosis not present

## 2021-10-04 DIAGNOSIS — T8249XA Other complication of vascular dialysis catheter, initial encounter: Secondary | ICD-10-CM | POA: Diagnosis not present

## 2021-10-05 DIAGNOSIS — Z992 Dependence on renal dialysis: Secondary | ICD-10-CM | POA: Diagnosis not present

## 2021-10-05 DIAGNOSIS — N186 End stage renal disease: Secondary | ICD-10-CM | POA: Diagnosis not present

## 2021-10-05 DIAGNOSIS — R7881 Bacteremia: Secondary | ICD-10-CM | POA: Diagnosis not present

## 2021-10-05 DIAGNOSIS — T82898A Other specified complication of vascular prosthetic devices, implants and grafts, initial encounter: Secondary | ICD-10-CM | POA: Diagnosis not present

## 2021-10-05 DIAGNOSIS — E875 Hyperkalemia: Secondary | ICD-10-CM | POA: Diagnosis not present

## 2021-10-05 DIAGNOSIS — B9562 Methicillin resistant Staphylococcus aureus infection as the cause of diseases classified elsewhere: Secondary | ICD-10-CM | POA: Diagnosis not present

## 2021-10-05 DIAGNOSIS — T8249XA Other complication of vascular dialysis catheter, initial encounter: Secondary | ICD-10-CM | POA: Diagnosis not present

## 2021-10-05 DIAGNOSIS — Z940 Kidney transplant status: Secondary | ICD-10-CM | POA: Diagnosis not present

## 2021-10-06 DIAGNOSIS — R7881 Bacteremia: Secondary | ICD-10-CM | POA: Diagnosis not present

## 2021-10-06 DIAGNOSIS — N041 Nephrotic syndrome with focal and segmental glomerular lesions: Secondary | ICD-10-CM | POA: Diagnosis not present

## 2021-10-06 DIAGNOSIS — T8249XA Other complication of vascular dialysis catheter, initial encounter: Secondary | ICD-10-CM | POA: Diagnosis not present

## 2021-10-06 DIAGNOSIS — B9562 Methicillin resistant Staphylococcus aureus infection as the cause of diseases classified elsewhere: Secondary | ICD-10-CM | POA: Diagnosis not present

## 2021-10-06 DIAGNOSIS — E875 Hyperkalemia: Secondary | ICD-10-CM | POA: Diagnosis not present

## 2021-10-06 DIAGNOSIS — T82898A Other specified complication of vascular prosthetic devices, implants and grafts, initial encounter: Secondary | ICD-10-CM | POA: Diagnosis not present

## 2021-10-06 DIAGNOSIS — Z940 Kidney transplant status: Secondary | ICD-10-CM | POA: Diagnosis not present

## 2021-10-06 DIAGNOSIS — N186 End stage renal disease: Secondary | ICD-10-CM | POA: Diagnosis not present

## 2021-10-06 DIAGNOSIS — Z992 Dependence on renal dialysis: Secondary | ICD-10-CM | POA: Diagnosis not present

## 2021-10-07 DIAGNOSIS — T8249XA Other complication of vascular dialysis catheter, initial encounter: Secondary | ICD-10-CM | POA: Diagnosis not present

## 2021-10-07 DIAGNOSIS — Z992 Dependence on renal dialysis: Secondary | ICD-10-CM | POA: Diagnosis not present

## 2021-10-07 DIAGNOSIS — E875 Hyperkalemia: Secondary | ICD-10-CM | POA: Diagnosis not present

## 2021-10-07 DIAGNOSIS — Z940 Kidney transplant status: Secondary | ICD-10-CM | POA: Diagnosis not present

## 2021-10-07 DIAGNOSIS — N186 End stage renal disease: Secondary | ICD-10-CM | POA: Diagnosis not present

## 2021-10-07 DIAGNOSIS — Z8639 Personal history of other endocrine, nutritional and metabolic disease: Secondary | ICD-10-CM | POA: Diagnosis not present

## 2021-10-07 DIAGNOSIS — T82898A Other specified complication of vascular prosthetic devices, implants and grafts, initial encounter: Secondary | ICD-10-CM | POA: Diagnosis not present

## 2021-10-07 DIAGNOSIS — R7881 Bacteremia: Secondary | ICD-10-CM | POA: Diagnosis not present

## 2021-10-08 DIAGNOSIS — Z992 Dependence on renal dialysis: Secondary | ICD-10-CM | POA: Diagnosis not present

## 2021-10-08 DIAGNOSIS — N186 End stage renal disease: Secondary | ICD-10-CM | POA: Diagnosis not present

## 2021-10-08 DIAGNOSIS — T8249XA Other complication of vascular dialysis catheter, initial encounter: Secondary | ICD-10-CM | POA: Diagnosis not present

## 2021-10-08 DIAGNOSIS — B9562 Methicillin resistant Staphylococcus aureus infection as the cause of diseases classified elsewhere: Secondary | ICD-10-CM | POA: Diagnosis not present

## 2021-10-08 DIAGNOSIS — T82898A Other specified complication of vascular prosthetic devices, implants and grafts, initial encounter: Secondary | ICD-10-CM | POA: Diagnosis not present

## 2021-10-08 DIAGNOSIS — Z940 Kidney transplant status: Secondary | ICD-10-CM | POA: Diagnosis not present

## 2021-10-08 DIAGNOSIS — E875 Hyperkalemia: Secondary | ICD-10-CM | POA: Diagnosis not present

## 2021-10-08 DIAGNOSIS — R7881 Bacteremia: Secondary | ICD-10-CM | POA: Diagnosis not present

## 2021-10-09 DIAGNOSIS — Z992 Dependence on renal dialysis: Secondary | ICD-10-CM | POA: Diagnosis not present

## 2021-10-09 DIAGNOSIS — Z8639 Personal history of other endocrine, nutritional and metabolic disease: Secondary | ICD-10-CM | POA: Diagnosis not present

## 2021-10-09 DIAGNOSIS — E875 Hyperkalemia: Secondary | ICD-10-CM | POA: Diagnosis not present

## 2021-10-09 DIAGNOSIS — R7881 Bacteremia: Secondary | ICD-10-CM | POA: Diagnosis not present

## 2021-10-09 DIAGNOSIS — T8249XA Other complication of vascular dialysis catheter, initial encounter: Secondary | ICD-10-CM | POA: Diagnosis not present

## 2021-10-09 DIAGNOSIS — T82898A Other specified complication of vascular prosthetic devices, implants and grafts, initial encounter: Secondary | ICD-10-CM | POA: Diagnosis not present

## 2021-10-09 DIAGNOSIS — Z940 Kidney transplant status: Secondary | ICD-10-CM | POA: Diagnosis not present

## 2021-10-09 DIAGNOSIS — N186 End stage renal disease: Secondary | ICD-10-CM | POA: Diagnosis not present

## 2021-10-10 DIAGNOSIS — E875 Hyperkalemia: Secondary | ICD-10-CM | POA: Diagnosis not present

## 2021-10-10 DIAGNOSIS — Z8639 Personal history of other endocrine, nutritional and metabolic disease: Secondary | ICD-10-CM | POA: Diagnosis not present

## 2021-10-10 DIAGNOSIS — N186 End stage renal disease: Secondary | ICD-10-CM | POA: Diagnosis not present

## 2021-10-10 DIAGNOSIS — Z940 Kidney transplant status: Secondary | ICD-10-CM | POA: Diagnosis not present

## 2021-10-10 DIAGNOSIS — Z992 Dependence on renal dialysis: Secondary | ICD-10-CM | POA: Diagnosis not present

## 2021-10-10 DIAGNOSIS — T8249XA Other complication of vascular dialysis catheter, initial encounter: Secondary | ICD-10-CM | POA: Diagnosis not present

## 2021-10-10 DIAGNOSIS — R7881 Bacteremia: Secondary | ICD-10-CM | POA: Diagnosis not present

## 2021-10-10 DIAGNOSIS — T82898A Other specified complication of vascular prosthetic devices, implants and grafts, initial encounter: Secondary | ICD-10-CM | POA: Diagnosis not present

## 2021-10-11 DIAGNOSIS — N186 End stage renal disease: Secondary | ICD-10-CM | POA: Diagnosis not present

## 2021-10-11 DIAGNOSIS — R7881 Bacteremia: Secondary | ICD-10-CM | POA: Diagnosis not present

## 2021-10-11 DIAGNOSIS — I120 Hypertensive chronic kidney disease with stage 5 chronic kidney disease or end stage renal disease: Secondary | ICD-10-CM | POA: Diagnosis not present

## 2021-10-11 DIAGNOSIS — Z940 Kidney transplant status: Secondary | ICD-10-CM | POA: Diagnosis not present

## 2021-10-11 DIAGNOSIS — A4102 Sepsis due to Methicillin resistant Staphylococcus aureus: Secondary | ICD-10-CM | POA: Insufficient documentation

## 2021-10-11 DIAGNOSIS — T8612 Kidney transplant failure: Secondary | ICD-10-CM | POA: Diagnosis not present

## 2021-10-11 DIAGNOSIS — Z9989 Dependence on other enabling machines and devices: Secondary | ICD-10-CM | POA: Diagnosis not present

## 2021-10-11 DIAGNOSIS — T8249XA Other complication of vascular dialysis catheter, initial encounter: Secondary | ICD-10-CM | POA: Diagnosis not present

## 2021-10-11 DIAGNOSIS — E1122 Type 2 diabetes mellitus with diabetic chronic kidney disease: Secondary | ICD-10-CM | POA: Diagnosis not present

## 2021-10-11 DIAGNOSIS — T82898A Other specified complication of vascular prosthetic devices, implants and grafts, initial encounter: Secondary | ICD-10-CM | POA: Diagnosis not present

## 2021-10-11 DIAGNOSIS — I871 Compression of vein: Secondary | ICD-10-CM | POA: Diagnosis not present

## 2021-10-11 DIAGNOSIS — T82868A Thrombosis of vascular prosthetic devices, implants and grafts, initial encounter: Secondary | ICD-10-CM | POA: Diagnosis not present

## 2021-10-11 DIAGNOSIS — Z7901 Long term (current) use of anticoagulants: Secondary | ICD-10-CM | POA: Diagnosis not present

## 2021-10-11 DIAGNOSIS — B9562 Methicillin resistant Staphylococcus aureus infection as the cause of diseases classified elsewhere: Secondary | ICD-10-CM | POA: Diagnosis not present

## 2021-10-11 DIAGNOSIS — Z992 Dependence on renal dialysis: Secondary | ICD-10-CM | POA: Diagnosis not present

## 2021-10-11 DIAGNOSIS — G4733 Obstructive sleep apnea (adult) (pediatric): Secondary | ICD-10-CM | POA: Diagnosis not present

## 2021-10-11 DIAGNOSIS — Z8639 Personal history of other endocrine, nutritional and metabolic disease: Secondary | ICD-10-CM | POA: Diagnosis not present

## 2021-10-11 DIAGNOSIS — D631 Anemia in chronic kidney disease: Secondary | ICD-10-CM | POA: Diagnosis not present

## 2021-10-11 DIAGNOSIS — E872 Acidosis, unspecified: Secondary | ICD-10-CM | POA: Diagnosis not present

## 2021-10-11 DIAGNOSIS — I5189 Other ill-defined heart diseases: Secondary | ICD-10-CM | POA: Diagnosis not present

## 2021-10-12 DIAGNOSIS — R7881 Bacteremia: Secondary | ICD-10-CM | POA: Diagnosis not present

## 2021-10-12 DIAGNOSIS — T82898A Other specified complication of vascular prosthetic devices, implants and grafts, initial encounter: Secondary | ICD-10-CM | POA: Diagnosis not present

## 2021-10-12 DIAGNOSIS — N186 End stage renal disease: Secondary | ICD-10-CM | POA: Diagnosis not present

## 2021-10-12 DIAGNOSIS — B9562 Methicillin resistant Staphylococcus aureus infection as the cause of diseases classified elsewhere: Secondary | ICD-10-CM | POA: Diagnosis not present

## 2021-10-12 DIAGNOSIS — Z940 Kidney transplant status: Secondary | ICD-10-CM | POA: Diagnosis not present

## 2021-10-13 DIAGNOSIS — R7881 Bacteremia: Secondary | ICD-10-CM | POA: Diagnosis not present

## 2021-10-13 DIAGNOSIS — N186 End stage renal disease: Secondary | ICD-10-CM | POA: Diagnosis not present

## 2021-10-13 DIAGNOSIS — B9562 Methicillin resistant Staphylococcus aureus infection as the cause of diseases classified elsewhere: Secondary | ICD-10-CM | POA: Diagnosis not present

## 2021-10-13 DIAGNOSIS — Z940 Kidney transplant status: Secondary | ICD-10-CM | POA: Diagnosis not present

## 2021-10-13 DIAGNOSIS — T82898A Other specified complication of vascular prosthetic devices, implants and grafts, initial encounter: Secondary | ICD-10-CM | POA: Diagnosis not present

## 2021-10-14 DIAGNOSIS — T82898A Other specified complication of vascular prosthetic devices, implants and grafts, initial encounter: Secondary | ICD-10-CM | POA: Diagnosis not present

## 2021-10-14 DIAGNOSIS — Z940 Kidney transplant status: Secondary | ICD-10-CM | POA: Diagnosis not present

## 2021-10-14 DIAGNOSIS — G4733 Obstructive sleep apnea (adult) (pediatric): Secondary | ICD-10-CM | POA: Diagnosis not present

## 2021-10-14 DIAGNOSIS — N186 End stage renal disease: Secondary | ICD-10-CM | POA: Diagnosis not present

## 2021-10-14 DIAGNOSIS — Z9989 Dependence on other enabling machines and devices: Secondary | ICD-10-CM | POA: Diagnosis not present

## 2021-10-14 DIAGNOSIS — I5189 Other ill-defined heart diseases: Secondary | ICD-10-CM | POA: Diagnosis not present

## 2021-10-14 DIAGNOSIS — R7881 Bacteremia: Secondary | ICD-10-CM | POA: Diagnosis not present

## 2021-10-14 DIAGNOSIS — B9562 Methicillin resistant Staphylococcus aureus infection as the cause of diseases classified elsewhere: Secondary | ICD-10-CM | POA: Diagnosis not present

## 2021-10-15 DIAGNOSIS — R7881 Bacteremia: Secondary | ICD-10-CM | POA: Diagnosis not present

## 2021-10-15 DIAGNOSIS — Z940 Kidney transplant status: Secondary | ICD-10-CM | POA: Diagnosis not present

## 2021-10-15 DIAGNOSIS — N186 End stage renal disease: Secondary | ICD-10-CM | POA: Diagnosis not present

## 2021-10-15 DIAGNOSIS — B9562 Methicillin resistant Staphylococcus aureus infection as the cause of diseases classified elsewhere: Secondary | ICD-10-CM | POA: Diagnosis not present

## 2021-10-15 DIAGNOSIS — T82898A Other specified complication of vascular prosthetic devices, implants and grafts, initial encounter: Secondary | ICD-10-CM | POA: Diagnosis not present

## 2021-10-17 DIAGNOSIS — N186 End stage renal disease: Secondary | ICD-10-CM | POA: Diagnosis not present

## 2021-10-17 DIAGNOSIS — N2581 Secondary hyperparathyroidism of renal origin: Secondary | ICD-10-CM | POA: Diagnosis not present

## 2021-10-17 DIAGNOSIS — Z992 Dependence on renal dialysis: Secondary | ICD-10-CM | POA: Diagnosis not present

## 2021-10-19 DIAGNOSIS — N186 End stage renal disease: Secondary | ICD-10-CM | POA: Diagnosis not present

## 2021-10-19 DIAGNOSIS — N2581 Secondary hyperparathyroidism of renal origin: Secondary | ICD-10-CM | POA: Diagnosis not present

## 2021-10-19 DIAGNOSIS — Z992 Dependence on renal dialysis: Secondary | ICD-10-CM | POA: Diagnosis not present

## 2021-10-22 DIAGNOSIS — Z992 Dependence on renal dialysis: Secondary | ICD-10-CM | POA: Diagnosis not present

## 2021-10-22 DIAGNOSIS — N186 End stage renal disease: Secondary | ICD-10-CM | POA: Diagnosis not present

## 2021-10-22 DIAGNOSIS — N2581 Secondary hyperparathyroidism of renal origin: Secondary | ICD-10-CM | POA: Diagnosis not present

## 2021-10-24 DIAGNOSIS — N2581 Secondary hyperparathyroidism of renal origin: Secondary | ICD-10-CM | POA: Diagnosis not present

## 2021-10-24 DIAGNOSIS — N186 End stage renal disease: Secondary | ICD-10-CM | POA: Diagnosis not present

## 2021-10-24 DIAGNOSIS — Z992 Dependence on renal dialysis: Secondary | ICD-10-CM | POA: Diagnosis not present

## 2021-10-26 DIAGNOSIS — N186 End stage renal disease: Secondary | ICD-10-CM | POA: Diagnosis not present

## 2021-10-26 DIAGNOSIS — Z992 Dependence on renal dialysis: Secondary | ICD-10-CM | POA: Diagnosis not present

## 2021-10-26 DIAGNOSIS — N2581 Secondary hyperparathyroidism of renal origin: Secondary | ICD-10-CM | POA: Diagnosis not present

## 2021-10-29 DIAGNOSIS — Z992 Dependence on renal dialysis: Secondary | ICD-10-CM | POA: Diagnosis not present

## 2021-10-29 DIAGNOSIS — N186 End stage renal disease: Secondary | ICD-10-CM | POA: Diagnosis not present

## 2021-10-29 DIAGNOSIS — N2581 Secondary hyperparathyroidism of renal origin: Secondary | ICD-10-CM | POA: Diagnosis not present

## 2021-10-31 DIAGNOSIS — Z992 Dependence on renal dialysis: Secondary | ICD-10-CM | POA: Diagnosis not present

## 2021-10-31 DIAGNOSIS — N186 End stage renal disease: Secondary | ICD-10-CM | POA: Diagnosis not present

## 2021-10-31 DIAGNOSIS — N2581 Secondary hyperparathyroidism of renal origin: Secondary | ICD-10-CM | POA: Diagnosis not present

## 2021-11-01 DIAGNOSIS — N186 End stage renal disease: Secondary | ICD-10-CM | POA: Diagnosis not present

## 2021-11-01 DIAGNOSIS — Z992 Dependence on renal dialysis: Secondary | ICD-10-CM | POA: Diagnosis not present

## 2021-11-02 DIAGNOSIS — N2581 Secondary hyperparathyroidism of renal origin: Secondary | ICD-10-CM | POA: Diagnosis not present

## 2021-11-02 DIAGNOSIS — Z992 Dependence on renal dialysis: Secondary | ICD-10-CM | POA: Diagnosis not present

## 2021-11-02 DIAGNOSIS — N186 End stage renal disease: Secondary | ICD-10-CM | POA: Diagnosis not present

## 2021-11-05 DIAGNOSIS — N2581 Secondary hyperparathyroidism of renal origin: Secondary | ICD-10-CM | POA: Diagnosis not present

## 2021-11-05 DIAGNOSIS — N186 End stage renal disease: Secondary | ICD-10-CM | POA: Diagnosis not present

## 2021-11-05 DIAGNOSIS — Z992 Dependence on renal dialysis: Secondary | ICD-10-CM | POA: Diagnosis not present

## 2021-11-06 DIAGNOSIS — N186 End stage renal disease: Secondary | ICD-10-CM | POA: Diagnosis not present

## 2021-11-06 DIAGNOSIS — N041 Nephrotic syndrome with focal and segmental glomerular lesions: Secondary | ICD-10-CM | POA: Diagnosis not present

## 2021-11-06 DIAGNOSIS — Z992 Dependence on renal dialysis: Secondary | ICD-10-CM | POA: Diagnosis not present

## 2021-11-07 DIAGNOSIS — N186 End stage renal disease: Secondary | ICD-10-CM | POA: Diagnosis not present

## 2021-11-07 DIAGNOSIS — N2581 Secondary hyperparathyroidism of renal origin: Secondary | ICD-10-CM | POA: Diagnosis not present

## 2021-11-07 DIAGNOSIS — Z992 Dependence on renal dialysis: Secondary | ICD-10-CM | POA: Diagnosis not present

## 2021-11-09 DIAGNOSIS — N186 End stage renal disease: Secondary | ICD-10-CM | POA: Diagnosis not present

## 2021-11-09 DIAGNOSIS — Z992 Dependence on renal dialysis: Secondary | ICD-10-CM | POA: Diagnosis not present

## 2021-11-09 DIAGNOSIS — N2581 Secondary hyperparathyroidism of renal origin: Secondary | ICD-10-CM | POA: Diagnosis not present

## 2021-11-12 DIAGNOSIS — Z992 Dependence on renal dialysis: Secondary | ICD-10-CM | POA: Diagnosis not present

## 2021-11-12 DIAGNOSIS — N2581 Secondary hyperparathyroidism of renal origin: Secondary | ICD-10-CM | POA: Diagnosis not present

## 2021-11-12 DIAGNOSIS — N186 End stage renal disease: Secondary | ICD-10-CM | POA: Diagnosis not present

## 2021-11-14 DIAGNOSIS — N2581 Secondary hyperparathyroidism of renal origin: Secondary | ICD-10-CM | POA: Diagnosis not present

## 2021-11-14 DIAGNOSIS — N186 End stage renal disease: Secondary | ICD-10-CM | POA: Diagnosis not present

## 2021-11-14 DIAGNOSIS — Z992 Dependence on renal dialysis: Secondary | ICD-10-CM | POA: Diagnosis not present

## 2021-11-16 DIAGNOSIS — N2581 Secondary hyperparathyroidism of renal origin: Secondary | ICD-10-CM | POA: Diagnosis not present

## 2021-11-16 DIAGNOSIS — Z992 Dependence on renal dialysis: Secondary | ICD-10-CM | POA: Diagnosis not present

## 2021-11-16 DIAGNOSIS — N186 End stage renal disease: Secondary | ICD-10-CM | POA: Diagnosis not present

## 2021-11-19 DIAGNOSIS — Z992 Dependence on renal dialysis: Secondary | ICD-10-CM | POA: Diagnosis not present

## 2021-11-19 DIAGNOSIS — N186 End stage renal disease: Secondary | ICD-10-CM | POA: Diagnosis not present

## 2021-11-19 DIAGNOSIS — N2581 Secondary hyperparathyroidism of renal origin: Secondary | ICD-10-CM | POA: Diagnosis not present

## 2021-11-22 DIAGNOSIS — I871 Compression of vein: Secondary | ICD-10-CM | POA: Diagnosis not present

## 2021-11-22 DIAGNOSIS — N2581 Secondary hyperparathyroidism of renal origin: Secondary | ICD-10-CM | POA: Diagnosis not present

## 2021-11-22 DIAGNOSIS — N186 End stage renal disease: Secondary | ICD-10-CM | POA: Diagnosis not present

## 2021-11-22 DIAGNOSIS — Z992 Dependence on renal dialysis: Secondary | ICD-10-CM | POA: Diagnosis not present

## 2021-11-22 DIAGNOSIS — T8249XA Other complication of vascular dialysis catheter, initial encounter: Secondary | ICD-10-CM | POA: Diagnosis not present

## 2021-11-23 DIAGNOSIS — N186 End stage renal disease: Secondary | ICD-10-CM | POA: Diagnosis not present

## 2021-11-23 DIAGNOSIS — N2581 Secondary hyperparathyroidism of renal origin: Secondary | ICD-10-CM | POA: Diagnosis not present

## 2021-11-23 DIAGNOSIS — Z992 Dependence on renal dialysis: Secondary | ICD-10-CM | POA: Diagnosis not present

## 2021-11-26 DIAGNOSIS — N2581 Secondary hyperparathyroidism of renal origin: Secondary | ICD-10-CM | POA: Diagnosis not present

## 2021-11-26 DIAGNOSIS — Z992 Dependence on renal dialysis: Secondary | ICD-10-CM | POA: Diagnosis not present

## 2021-11-26 DIAGNOSIS — N186 End stage renal disease: Secondary | ICD-10-CM | POA: Diagnosis not present

## 2021-11-28 DIAGNOSIS — Z992 Dependence on renal dialysis: Secondary | ICD-10-CM | POA: Diagnosis not present

## 2021-11-28 DIAGNOSIS — N186 End stage renal disease: Secondary | ICD-10-CM | POA: Diagnosis not present

## 2021-11-28 DIAGNOSIS — N2581 Secondary hyperparathyroidism of renal origin: Secondary | ICD-10-CM | POA: Diagnosis not present

## 2021-12-03 DIAGNOSIS — Z992 Dependence on renal dialysis: Secondary | ICD-10-CM | POA: Diagnosis not present

## 2021-12-03 DIAGNOSIS — N2581 Secondary hyperparathyroidism of renal origin: Secondary | ICD-10-CM | POA: Diagnosis not present

## 2021-12-03 DIAGNOSIS — I871 Compression of vein: Secondary | ICD-10-CM | POA: Diagnosis not present

## 2021-12-03 DIAGNOSIS — T8249XA Other complication of vascular dialysis catheter, initial encounter: Secondary | ICD-10-CM | POA: Diagnosis not present

## 2021-12-03 DIAGNOSIS — N186 End stage renal disease: Secondary | ICD-10-CM | POA: Diagnosis not present

## 2021-12-05 DIAGNOSIS — Z992 Dependence on renal dialysis: Secondary | ICD-10-CM | POA: Diagnosis not present

## 2021-12-05 DIAGNOSIS — N2581 Secondary hyperparathyroidism of renal origin: Secondary | ICD-10-CM | POA: Diagnosis not present

## 2021-12-05 DIAGNOSIS — N186 End stage renal disease: Secondary | ICD-10-CM | POA: Diagnosis not present

## 2021-12-06 DIAGNOSIS — N041 Nephrotic syndrome with focal and segmental glomerular lesions: Secondary | ICD-10-CM | POA: Diagnosis not present

## 2021-12-06 DIAGNOSIS — Z992 Dependence on renal dialysis: Secondary | ICD-10-CM | POA: Diagnosis not present

## 2021-12-06 DIAGNOSIS — N186 End stage renal disease: Secondary | ICD-10-CM | POA: Diagnosis not present

## 2021-12-07 DIAGNOSIS — N186 End stage renal disease: Secondary | ICD-10-CM | POA: Diagnosis not present

## 2021-12-07 DIAGNOSIS — N2581 Secondary hyperparathyroidism of renal origin: Secondary | ICD-10-CM | POA: Diagnosis not present

## 2021-12-07 DIAGNOSIS — Z992 Dependence on renal dialysis: Secondary | ICD-10-CM | POA: Diagnosis not present

## 2021-12-10 DIAGNOSIS — Z992 Dependence on renal dialysis: Secondary | ICD-10-CM | POA: Diagnosis not present

## 2021-12-10 DIAGNOSIS — N186 End stage renal disease: Secondary | ICD-10-CM | POA: Diagnosis not present

## 2021-12-10 DIAGNOSIS — N2581 Secondary hyperparathyroidism of renal origin: Secondary | ICD-10-CM | POA: Diagnosis not present

## 2021-12-11 ENCOUNTER — Encounter: Payer: Self-pay | Admitting: Family Medicine

## 2021-12-11 ENCOUNTER — Ambulatory Visit (INDEPENDENT_AMBULATORY_CARE_PROVIDER_SITE_OTHER): Payer: Medicare HMO | Admitting: Family Medicine

## 2021-12-11 VITALS — BP 100/70 | HR 79 | Temp 97.0°F | Ht 74.0 in | Wt 326.4 lb

## 2021-12-11 DIAGNOSIS — Z992 Dependence on renal dialysis: Secondary | ICD-10-CM

## 2021-12-11 DIAGNOSIS — I10 Essential (primary) hypertension: Secondary | ICD-10-CM | POA: Diagnosis not present

## 2021-12-11 DIAGNOSIS — I5032 Chronic diastolic (congestive) heart failure: Secondary | ICD-10-CM

## 2021-12-11 DIAGNOSIS — E039 Hypothyroidism, unspecified: Secondary | ICD-10-CM

## 2021-12-11 DIAGNOSIS — Z1211 Encounter for screening for malignant neoplasm of colon: Secondary | ICD-10-CM

## 2021-12-11 DIAGNOSIS — I480 Paroxysmal atrial fibrillation: Secondary | ICD-10-CM

## 2021-12-11 DIAGNOSIS — N186 End stage renal disease: Secondary | ICD-10-CM | POA: Diagnosis not present

## 2021-12-11 LAB — TSH: TSH: 3.4 u[IU]/mL (ref 0.35–5.50)

## 2021-12-11 LAB — HEMOGLOBIN A1C: Hgb A1c MFr Bld: 5.3 % (ref 4.6–6.5)

## 2021-12-11 NOTE — Progress Notes (Signed)
[facility] [address] [place] [address] Dept: [phone] Dept Fax: [phone]  Chronic Care Office Visit  Subjective:    Patient ID: [name], male    DOB: November 22, 1975, 46 y.o..   MRN: [id number]  Chief Complaint  Patient presents with   Follow-up    A1C f/u. Fasting.  Wants colonoscopy. Will schedule an eye exam this week.    History of Present Illness:  Patient is in today for reassessment of chronic medical issues.  Mr. Kuhrt has a history of ESRD on dialysis and Type 2 diabetes. He is not on therapy for diabetes and has been at goal in the past. He feels that he never had diabetes, but this diagnosis was based on having developed kidney failure. It is unclear how significant the diabetes may have been in the past. Mr. Schildt had a prior kidney transplant, but then rejected that kidney. He continues on hemodialysis, but has had recurrent issues with his catheter. He also had an MRSA infection in early May for which he was on IV Vancomycin for. He was more recently, engaged back with the kidney transplant team. He notes he is need of a colonosocpy as part of his assessment for suitability to be back on the transplant list.   Past Medical History: Patient Active Problem List   Diagnosis Date Noted   Sepsis due to methicillin resistant Staphylococcus aureus ([place]) 10/11/2021   Thrombus of venous dialysis catheter, initial encounter ([place]) 07/15/2021   Hyperkalemia 07/15/2021   Other mechanical complication of surgically created arteriovenous shunt, initial encounter ([place]) 20/94/7096   Complication of vascular dialysis catheter 02/18/2021   Peripheral neuropathy 02/06/2021   Secondary hyperparathyroidism of renal origin ([place]) 12/07/2020   Hyperlipidemia 12/06/2020   CHF (congestive heart failure) ([place])    Tobacco use    Marijuana use, continuous    Debility 12/13/2018   Medically noncompliant    Genital  warts 11/03/2018   Meralgia paresthetica of right side 08/13/2018   ESRD on dialysis ([place]) 06/04/2018   Type 2 diabetes mellitus with other diabetic kidney complication ([place]) 28/36/6294   Paroxysmal atrial fibrillation ([place]) 05/24/2018   Hypothyroidism, unspecified 05/24/2018   Essential hypertension 01/25/2017   Morbid obesity ([place]) 01/17/2016   Delayed graft function of kidney transplant due to reason other than ATN or rejection requiring acute dialysis ([place]) 11/02/2012   S/P kidney transplant 11/02/2012   OSA (obstructive sleep apnea) 05/26/2011   Past Surgical History:  Procedure Laterality Date   ARTERIOVENOUS GRAFT PLACEMENT  08/09/2010   Left Thigh Graft by Dr. [name]   AV FISTULA PLACEMENT Right 05/18/2018   Procedure: INSERTION OF ARTERIOVENOUS (AV) GORE-TEX GRAFT Left THIGH;  Surgeon: Waynetta Sandy, MD;  Location: [facility];  Service: Vascular;  Laterality: Right;   AV FISTULA PLACEMENT Right 02/15/2021   Procedure: INSERTION OF ARTERIOVENOUS (AV) GORE-TEX LOOP GRAFT RIGHT THIGH;  Surgeon: Waynetta Sandy, MD;  Location: [facility];  Service: Vascular;  Laterality: Right;   CAPD REMOVAL  05/08/2011   Procedure: CONTINUOUS AMBULATORY PERITONEAL DIALYSIS  (CAPD) CATHETER REMOVAL;  Surgeon: [name], MD;  Location: [facility];  Service: General;  Laterality: N/A;  Removal of CAPD catheter, Dr. requests to go after 100   INSERTION OF DIALYSIS CATHETER  10/05/2010   Right Femoral Cath insertion by Dr. [name].  Pt ahas had several caths inserted.   INSERTION OF DIALYSIS CATHETER Right 02/15/2021   Procedure: Attempted INSERTION OF RIGHT  and Left Internal Jugular DIALYSIS CATHETER, Insertion of Left Femoral Vein Dialysis Catheter;  Surgeon: Waynetta Sandy, MD;  Location: [facility];  Service: Vascular;  Laterality: Right;   INSERTION OF DIALYSIS CATHETER Right 04/26/2021   Procedure: INSERTION OF TUNNELED 55cm PALIDROME PRECISION CHRONIC DIALYSIS  CATHETER;  Surgeon: Cherre Robins, MD;  Location: [facility];  Service: Vascular;  Laterality: Right;   IR FLUORO GUIDE CV LINE RIGHT  05/11/2018   IR FLUORO GUIDE CV LINE RIGHT  07/03/2021   IR FLUORO GUIDE CV LINE RIGHT  07/16/2021   IR PTA ADDL CENTRAL DIALYSIS SEG THRU DIALY CIRCUIT RIGHT Right 07/03/2021   IR US GUIDE VASC ACCESS RIGHT  05/11/2018   IR VENOCAVAGRAM IVC  07/16/2021   KIDNEY TRANSPLANT  2014   KNEE ARTHROSCOPY Left    THROMBECTOMY W/ EMBOLECTOMY Right 04/26/2021   Procedure: REMOVAL OF RIGHT THIGH ARTERIOVENOUS GORE-TEX GRAFT AND REPAIR OF RIGHT COMMON FEMORAL ARTERY;  Surgeon: Cherre Robins, MD;  Location: [facility];  Service: Vascular;  Laterality: Right;   UPPER EXTREMITY ANGIOGRAPHY Bilateral 05/13/2018   Procedure: UPPER EXTREMITY ANGIOGRAPHY - bilarteral;  Surgeon: Marty Heck, MD;  Location: [facility];  Service: Cardiovascular;  Laterality: Bilateral;   Family History  Problem Relation Age of Onset   Diabetes Father    Stroke Father    Stroke Maternal Grandmother    Anesthesia problems Neg Hx    Outpatient Medications Prior to Visit  Medication Sig Dispense Refill   apixaban (ELIQUIS) 5 MG TABS tablet Take by mouth.     atorvastatin (LIPITOR) 40 MG tablet Take 1 tablet (40 mg total) by mouth daily. 90 tablet 3   ferric citrate (AURYXIA) 1 GM 210 MG(Fe) tablet Take 630 mg by mouth 3 (three) times daily with meals.     No facility-administered medications prior to visit.   No Known Allergies    Objective:   Today's Vitals   12/11/21 1029  BP: 100/70  Pulse: 79  Temp: (!) 97 F (36.1 C)  TempSrc: Temporal  SpO2: 98%  Weight: (!) 326 lb 6.4 oz (148.1 kg)  Height: '6\' 2"'$  (1.88 m)   Body mass index is 41.91 kg/m.   General: Well developed, well nourished. No acute distress. CV: Regular rate and rhythm without murmur or rubs. Extremities: No edema noted. Psych: Alert and oriented. Normal mood and affect.  Health Maintenance Due  Topic  Date Due   FOOT EXAM  Never done   Hepatitis C Screening  Never done   OPHTHALMOLOGY EXAM  05/19/2019   COLONOSCOPY (Pts 45-11yr Insurance coverage will need to be confirmed)  Never done   HEMOGLOBIN A1C  06/06/2021     Assessment & Plan:   1. Essential hypertension Blood pressure has been in good control, no longer requiring medication..   2. Paroxysmal atrial fibrillation (HCC) A. fib is well controlled. Managed on Eliquis 5 mg daily for anticoagulation.  3. Chronic diastolic congestive heart failure ([place] Compensated. Fluid overload is typically managed through is dialysis.  4. Hypothyroidism, unspecified type Since his last visit in Sept., a diagnosis of hypothyroidism was added to his problem list. However, Mr. WPurdyis not on thyroid replacement. I will check a TSH to assess.  - TSH  5. ESRD on dialysis ([facility] Patient has renal failure, apparently due to prior hypertension issues. However, his problem list has included Type 2 diabetes. I will check an A1c to assess. If this remains normal, I iwll consider removign  this from his problem list.  - Hemoglobin A1c  6. Screening for colon cancer  - Ambulatory referral to Gastroenterology  Return in about 6 months (around 06/13/2022) for Reassessment.   Haydee Salter, MD

## 2021-12-12 DIAGNOSIS — N2581 Secondary hyperparathyroidism of renal origin: Secondary | ICD-10-CM | POA: Diagnosis not present

## 2021-12-12 DIAGNOSIS — N186 End stage renal disease: Secondary | ICD-10-CM | POA: Diagnosis not present

## 2021-12-12 DIAGNOSIS — Z992 Dependence on renal dialysis: Secondary | ICD-10-CM | POA: Diagnosis not present

## 2021-12-14 DIAGNOSIS — Z992 Dependence on renal dialysis: Secondary | ICD-10-CM | POA: Diagnosis not present

## 2021-12-14 DIAGNOSIS — N2581 Secondary hyperparathyroidism of renal origin: Secondary | ICD-10-CM | POA: Diagnosis not present

## 2021-12-14 DIAGNOSIS — N186 End stage renal disease: Secondary | ICD-10-CM | POA: Diagnosis not present

## 2021-12-17 DIAGNOSIS — N2581 Secondary hyperparathyroidism of renal origin: Secondary | ICD-10-CM | POA: Diagnosis not present

## 2021-12-17 DIAGNOSIS — N186 End stage renal disease: Secondary | ICD-10-CM | POA: Diagnosis not present

## 2021-12-17 DIAGNOSIS — Z992 Dependence on renal dialysis: Secondary | ICD-10-CM | POA: Diagnosis not present

## 2021-12-18 ENCOUNTER — Other Ambulatory Visit: Payer: Self-pay

## 2021-12-18 DIAGNOSIS — E782 Mixed hyperlipidemia: Secondary | ICD-10-CM

## 2021-12-18 MED ORDER — ATORVASTATIN CALCIUM 40 MG PO TABS: 40.0000 mg | ORAL_TABLET | Freq: Every day | ORAL | 3 refills | Status: DC

## 2021-12-19 DIAGNOSIS — N2581 Secondary hyperparathyroidism of renal origin: Secondary | ICD-10-CM | POA: Diagnosis not present

## 2021-12-19 DIAGNOSIS — Z992 Dependence on renal dialysis: Secondary | ICD-10-CM | POA: Diagnosis not present

## 2021-12-19 DIAGNOSIS — N186 End stage renal disease: Secondary | ICD-10-CM | POA: Diagnosis not present

## 2021-12-20 ENCOUNTER — Other Ambulatory Visit: Payer: Self-pay

## 2021-12-20 DIAGNOSIS — E782 Mixed hyperlipidemia: Secondary | ICD-10-CM

## 2021-12-20 MED ORDER — ATORVASTATIN CALCIUM 40 MG PO TABS: 40.0000 mg | ORAL_TABLET | Freq: Every day | ORAL | 3 refills | Status: DC

## 2021-12-24 DIAGNOSIS — N2581 Secondary hyperparathyroidism of renal origin: Secondary | ICD-10-CM | POA: Diagnosis not present

## 2021-12-24 DIAGNOSIS — T8249XA Other complication of vascular dialysis catheter, initial encounter: Secondary | ICD-10-CM | POA: Diagnosis not present

## 2021-12-24 DIAGNOSIS — N186 End stage renal disease: Secondary | ICD-10-CM | POA: Diagnosis not present

## 2021-12-24 DIAGNOSIS — I871 Compression of vein: Secondary | ICD-10-CM | POA: Diagnosis not present

## 2021-12-24 DIAGNOSIS — Z992 Dependence on renal dialysis: Secondary | ICD-10-CM | POA: Diagnosis not present

## 2021-12-25 ENCOUNTER — Encounter (HOSPITAL_COMMUNITY): Payer: Self-pay | Admitting: Emergency Medicine

## 2021-12-25 ENCOUNTER — Inpatient Hospital Stay (HOSPITAL_COMMUNITY)
Admission: EM | Admit: 2021-12-25 | Discharge: 2021-12-26 | DRG: 673 | Discharge status: Against Medical Advice | Payer: Medicare HMO | Attending: Family Medicine | Admitting: Family Medicine

## 2021-12-25 DIAGNOSIS — N186 End stage renal disease: Secondary | ICD-10-CM | POA: Diagnosis present

## 2021-12-25 DIAGNOSIS — N185 Chronic kidney disease, stage 5: Secondary | ICD-10-CM | POA: Diagnosis not present

## 2021-12-25 DIAGNOSIS — Z86718 Personal history of other venous thrombosis and embolism: Secondary | ICD-10-CM

## 2021-12-25 DIAGNOSIS — I10 Essential (primary) hypertension: Secondary | ICD-10-CM

## 2021-12-25 DIAGNOSIS — Z87891 Personal history of nicotine dependence: Secondary | ICD-10-CM | POA: Diagnosis not present

## 2021-12-25 DIAGNOSIS — I871 Compression of vein: Secondary | ICD-10-CM | POA: Diagnosis not present

## 2021-12-25 DIAGNOSIS — Y712 Prosthetic and other implants, materials and accessory cardiovascular devices associated with adverse incidents: Secondary | ICD-10-CM | POA: Diagnosis present

## 2021-12-25 DIAGNOSIS — I120 Hypertensive chronic kidney disease with stage 5 chronic kidney disease or end stage renal disease: Secondary | ICD-10-CM | POA: Diagnosis present

## 2021-12-25 DIAGNOSIS — Z992 Dependence on renal dialysis: Secondary | ICD-10-CM | POA: Diagnosis not present

## 2021-12-25 DIAGNOSIS — F1290 Cannabis use, unspecified, uncomplicated: Secondary | ICD-10-CM | POA: Diagnosis not present

## 2021-12-25 DIAGNOSIS — Z6841 Body Mass Index (BMI) 40.0 and over, adult: Secondary | ICD-10-CM | POA: Diagnosis not present

## 2021-12-25 DIAGNOSIS — Z79899 Other long term (current) drug therapy: Secondary | ICD-10-CM

## 2021-12-25 DIAGNOSIS — E875 Hyperkalemia: Secondary | ICD-10-CM | POA: Diagnosis present

## 2021-12-25 DIAGNOSIS — Z7901 Long term (current) use of anticoagulants: Secondary | ICD-10-CM

## 2021-12-25 DIAGNOSIS — G4730 Sleep apnea, unspecified: Secondary | ICD-10-CM | POA: Diagnosis present

## 2021-12-25 DIAGNOSIS — E1122 Type 2 diabetes mellitus with diabetic chronic kidney disease: Secondary | ICD-10-CM | POA: Diagnosis present

## 2021-12-25 DIAGNOSIS — I9589 Other hypotension: Secondary | ICD-10-CM | POA: Diagnosis present

## 2021-12-25 DIAGNOSIS — Z833 Family history of diabetes mellitus: Secondary | ICD-10-CM | POA: Diagnosis not present

## 2021-12-25 DIAGNOSIS — I509 Heart failure, unspecified: Secondary | ICD-10-CM | POA: Diagnosis not present

## 2021-12-25 DIAGNOSIS — T829XXA Unspecified complication of cardiac and vascular prosthetic device, implant and graft, initial encounter: Secondary | ICD-10-CM

## 2021-12-25 DIAGNOSIS — T8241XA Breakdown (mechanical) of vascular dialysis catheter, initial encounter: Principal | ICD-10-CM | POA: Diagnosis present

## 2021-12-25 DIAGNOSIS — I132 Hypertensive heart and chronic kidney disease with heart failure and with stage 5 chronic kidney disease, or end stage renal disease: Secondary | ICD-10-CM | POA: Diagnosis not present

## 2021-12-25 DIAGNOSIS — T8242XA Displacement of vascular dialysis catheter, initial encounter: Secondary | ICD-10-CM

## 2021-12-25 DIAGNOSIS — F1210 Cannabis abuse, uncomplicated: Secondary | ICD-10-CM | POA: Diagnosis present

## 2021-12-25 DIAGNOSIS — Z86711 Personal history of pulmonary embolism: Secondary | ICD-10-CM

## 2021-12-25 DIAGNOSIS — I480 Paroxysmal atrial fibrillation: Secondary | ICD-10-CM | POA: Diagnosis present

## 2021-12-25 DIAGNOSIS — T8612 Kidney transplant failure: Secondary | ICD-10-CM | POA: Diagnosis present

## 2021-12-25 DIAGNOSIS — D631 Anemia in chronic kidney disease: Secondary | ICD-10-CM | POA: Diagnosis not present

## 2021-12-25 LAB — CBC WITH DIFFERENTIAL/PLATELET
Abs Immature Granulocytes: 0.07 10*3/uL (ref 0.00–0.07)
Basophils Absolute: 0 10*3/uL (ref 0.0–0.1)
Basophils Relative: 1 %
Eosinophils Absolute: 0.3 10*3/uL (ref 0.0–0.5)
Eosinophils Relative: 6 %
HCT: 35.3 % — ABNORMAL LOW (ref 39.0–52.0)
Hemoglobin: 10.9 g/dL — ABNORMAL LOW (ref 13.0–17.0)
Immature Granulocytes: 2 %
Lymphocytes Relative: 21 %
Lymphs Abs: 1 10*3/uL (ref 0.7–4.0)
MCH: 29.2 pg (ref 26.0–34.0)
MCHC: 30.9 g/dL (ref 30.0–36.0)
MCV: 94.6 fL (ref 80.0–100.0)
Monocytes Absolute: 0.8 10*3/uL (ref 0.1–1.0)
Monocytes Relative: 17 %
Neutro Abs: 2.5 10*3/uL (ref 1.7–7.7)
Neutrophils Relative %: 53 %
Platelets: 165 10*3/uL (ref 150–400)
RBC: 3.73 MIL/uL — ABNORMAL LOW (ref 4.22–5.81)
RDW: 17.5 % — ABNORMAL HIGH (ref 11.5–15.5)
WBC: 4.6 10*3/uL (ref 4.0–10.5)
nRBC: 0 % (ref 0.0–0.2)

## 2021-12-25 LAB — COMPREHENSIVE METABOLIC PANEL
ALT: 10 U/L (ref 0–44)
AST: 11 U/L — ABNORMAL LOW (ref 15–41)
Albumin: 3.8 g/dL (ref 3.5–5.0)
Alkaline Phosphatase: 40 U/L (ref 38–126)
Anion gap: 23 — ABNORMAL HIGH (ref 5–15)
BUN: 122 mg/dL — ABNORMAL HIGH (ref 6–20)
CO2: 16 mmol/L — ABNORMAL LOW (ref 22–32)
Calcium: 9.3 mg/dL (ref 8.9–10.3)
Chloride: 99 mmol/L (ref 98–111)
Creatinine, Ser: 23.19 mg/dL — ABNORMAL HIGH (ref 0.61–1.24)
GFR, Estimated: 2 mL/min — ABNORMAL LOW (ref >60)
Glucose, Bld: 93 mg/dL (ref 70–99)
Potassium: 5.7 mmol/L — ABNORMAL HIGH (ref 3.5–5.1)
Sodium: 138 mmol/L (ref 135–145)
Total Bilirubin: 0.6 mg/dL (ref 0.3–1.2)
Total Protein: 7.6 g/dL (ref 6.5–8.1)

## 2021-12-25 MED ORDER — ATORVASTATIN CALCIUM 40 MG PO TABS
40.0000 mg | ORAL_TABLET | Freq: Every day | ORAL | Status: DC
  Filled 2021-12-25: qty 1

## 2021-12-25 MED ORDER — SODIUM ZIRCONIUM CYCLOSILICATE 10 G PO PACK
10.0000 g | PACK | Freq: Once | ORAL | Status: AC
  Administered 2021-12-25: 10 g via ORAL
  Filled 2021-12-25: qty 1

## 2021-12-25 MED ORDER — APIXABAN 5 MG PO TABS
5.0000 mg | ORAL_TABLET | Freq: Two times a day (BID) | ORAL | Status: DC
  Administered 2021-12-25: 5 mg via ORAL
  Filled 2021-12-25: qty 1

## 2021-12-25 MED ORDER — MELATONIN 5 MG PO TABS: 10.0000 mg | ORAL_TABLET | Freq: Every evening | ORAL | Status: DC | PRN

## 2021-12-25 MED ORDER — ONDANSETRON HCL 4 MG/2ML IJ SOLN: 4.0000 mg | Freq: Four times a day (QID) | INTRAMUSCULAR | Status: DC | PRN

## 2021-12-25 MED ORDER — ACETAMINOPHEN 325 MG PO TABS: 650.0000 mg | ORAL_TABLET | Freq: Four times a day (QID) | ORAL | Status: DC | PRN

## 2021-12-25 MED ORDER — ONDANSETRON HCL 4 MG PO TABS: 4.0000 mg | ORAL_TABLET | Freq: Four times a day (QID) | ORAL | Status: DC | PRN

## 2021-12-25 MED ORDER — ACETAMINOPHEN 650 MG RE SUPP: 650.0000 mg | Freq: Four times a day (QID) | RECTAL | Status: DC | PRN

## 2021-12-25 NOTE — ED Provider Triage Note (Signed)
Emergency Medicine Provider Triage Evaluation Note  JAMARIE MUSSA , a 46 y.o. male  was evaluated in triage.  Pt complains of pulled out his catheter.  Patient states that he just recently had this catheter placed 2 days ago.  Overnight last night he succeeded in pulling out his catheter unconsciously.  He has been on dialysis since 2008 and has failed multiple fistulas and now is catheter dependent to receive dialysis.  He receives dialysis Tuesday Thursday Saturday.  He was sent by his vascular clinic here since insurance would not cover replacement of the catheter in such a short timeframe.  He denies any change in symptoms from baseline but is only here because he was sent here for catheter placement..  Review of Systems  Positive:  Negative: See above  Physical Exam  BP 104/70 (BP Location: Right Arm)   Pulse 73   Temp 98.2 F (36.8 C) (Oral)   Resp 16   SpO2 100%  Gen:   Awake, no distress   Resp:  Normal effort  MSK:   Moves extremities without difficulty  Other:    Medical Decision Making  Medically screening exam initiated at 6:19 PM.  Appropriate orders placed.  Akshith Moncus Wienke was informed that the remainder of the evaluation will be completed by another provider, this initial triage assessment does not replace that evaluation, and the importance of remaining in the ED until their evaluation is complete.     Wilnette Kales, [place] 12/25/21 1830

## 2021-12-25 NOTE — Assessment & Plan Note (Signed)
Chronic. 

## 2021-12-25 NOTE — Assessment & Plan Note (Signed)
Chronic.  Dialyzes on Tuesday Thursday Saturday.  Nephrology aware of the patient's admission.

## 2021-12-25 NOTE — Assessment & Plan Note (Signed)
Chronic.  Patient smokes marijuana almost every day.

## 2021-12-25 NOTE — Assessment & Plan Note (Signed)
Observation MedSurg bed.  Nephrology aware of the patient's need for vascular access.  N.p.o. after midnight in case he needs a surgically placed catheter.

## 2021-12-25 NOTE — Assessment & Plan Note (Addendum)
Stable.Patient does not take any antihypertensives.

## 2021-12-25 NOTE — Assessment & Plan Note (Signed)
Patient given 10 mg of p.o. Lokelma in the ER.  Repeat chemistry in the morning.  Patient has a history of hyperkalemia.

## 2021-12-25 NOTE — ED Triage Notes (Signed)
Patient sent to ED from vascular clinic, patient woke this morning and he had accidentally pulled out his femoral dialysis catheter while asleep. Patient has no other complaints, denies pain, denies bleeding. Patient states vascular center sent him to the ED because he was told they could not do the procedure again due to insurance not paying a second time.

## 2021-12-25 NOTE — Assessment & Plan Note (Signed)
Stable Continue Eliquis 

## 2021-12-25 NOTE — ED Notes (Signed)
Bandage to R thigh with no bleeding noted. Pt reports he took the catheter to the vascular center and they advised it was intact after it was pulled out.

## 2021-12-25 NOTE — ED Provider Notes (Signed)
[facility] Provider Note   CSN: [id number] Arrival date & time: 12/25/21  1712     History  No chief complaint on file.   [name] is a 46 y.o. male.  46 year old male with prior medical history detailed below presents for evaluation. Patient is on dialysis.  Patient reports that his access for dialysis was in his right groin.  Line was changed yesterday by [facility].  Apparently overnight, the line "fell out."  After line change yesterday patient had 2 hours of dialysis session.  His last complete dialysis session was sometime last week per report.  Patient was sent to the ED for admission from [facility].  Patient's primary nephrologist is Dr. Moshe Cipro.  Patient is denying shortness of breath, fever, significant bleeding, pain.  The history is provided by the patient and medical records.       Home Medications Prior to Admission medications   Medication Sig Start Date End Date Taking? Authorizing Provider  apixaban (ELIQUIS) 5 MG TABS tablet Take by mouth. 10/10/21   [provider]  atorvastatin (LIPITOR) 40 MG tablet Take 1 tablet (40 mg total) by mouth daily. 12/20/21   Haydee Salter, MD  ferric citrate (AURYXIA) 1 GM 210 MG(Fe) tablet Take 630 mg by mouth 3 (three) times daily with meals. 09/06/19   [provider]      Allergies    Patient has no known allergies.    Review of Systems   Review of Systems  All other systems reviewed and are negative.   Physical Exam Updated Vital Signs BP 104/70 (BP Location: Right Arm)   Pulse 73   Temp 98.2 F (36.8 C) (Oral)   Resp 16   SpO2 100%  Physical Exam Vitals and nursing note reviewed.  Constitutional:      General: He is not in acute distress.    Appearance: Normal appearance. He is well-developed.  HENT:     Head: Normocephalic and atraumatic.  Eyes:     Conjunctiva/sclera: Conjunctivae normal.     Pupils: Pupils are equal, round,  and reactive to light.  Cardiovascular:     Rate and Rhythm: Normal rate and regular rhythm.     Heart sounds: Normal heart sounds.  Pulmonary:     Effort: Pulmonary effort is normal. No respiratory distress.     Breath sounds: Normal breath sounds.  Abdominal:     General: There is no distension.     Palpations: Abdomen is soft.     Tenderness: There is no abdominal tenderness.  Musculoskeletal:        General: No deformity. Normal range of motion.     Cervical back: Normal range of motion and neck supple.     Comments: Hematoma noted to right groin at recent catheter site.  No active bleeding noted.  Skin:    General: Skin is warm and dry.  Neurological:     General: No focal deficit present.     Mental Status: He is alert and oriented to person, place, and time.     ED Results / Procedures / Treatments   Labs (all labs ordered are listed, but only abnormal results are displayed) Labs Reviewed  COMPREHENSIVE METABOLIC PANEL - Abnormal; Notable for the following components:      Result Value   Potassium 5.7 (*)    CO2 16 (*)    BUN 122 (*)    Creatinine, Ser 23.19 (*)    AST 11 (*)  GFR, Estimated 2 (*)    Anion gap 23 (*)    All other components within normal limits  CBC WITH DIFFERENTIAL/PLATELET - Abnormal; Notable for the following components:   RBC 3.73 (*)    Hemoglobin 10.9 (*)    HCT 35.3 (*)    RDW 17.5 (*)    All other components within normal limits    EKG EKG Interpretation  Date/Time:  Wednesday December 25 2021 19:19:14 EDT Ventricular Rate:  63 PR Interval:  195 QRS Duration: 101 QT Interval:  403 QTC Calculation: 413 R Axis:   -22 Text Interpretation: Sinus rhythm Borderline left axis deviation Low voltage, precordial leads Confirmed by Dene Gentry [phone]) on 12/25/2021 7:20:45 PM  Radiology No results found.  Procedures Procedures    Medications Ordered in ED Medications  sodium zirconium cyclosilicate (LOKELMA) packet 10 g (has no  administration in time range)    ED Course/ Medical Decision Making/ A&P                           Medical Decision Making Risk Prescription drug management. Decision regarding hospitalization.    Medical Screen Complete  This patient presented to the ED with complaint of missed dialysis, loss of dialysis access.  This complaint involves an extensive number of treatment options. The initial differential diagnosis includes, but is not limited to, metabolic abnormality, etc.  This presentation is: Acute, Chronic, Self-Limited, Previously Undiagnosed, Uncertain Prognosis, Complicated, Systemic Symptoms, and Threat to Life/Bodily Function  Patient with longstanding history of dialysis.  Patient now without dialysis access after having had his right groin line fall out last night.  Patient with mildly elevated potassium.  Patient to be admitted for access placement.  Dr. Joelyn Oms with nephrology is aware of case.  Hospitalist service will evaluate for admission.  Co morbidities that complicated the patient's evaluation  ESRD   Additional history obtained:  External records from outside sources obtained and reviewed including prior ED visits and prior Inpatient records.    Lab Tests:  I ordered and personally interpreted labs.  The pertinent results include: CBC, CMP  Cardiac Monitoring:  The patient was maintained on a cardiac monitor.  I personally viewed and interpreted the cardiac monitor which showed an underlying rhythm of: NSR   Medicines ordered:  I ordered medication including the [facility] for Hyperkalemia  Reevaluation of the patient after these medicines showed that the patient: improved  Problem List / ED Course:  ESRD, hyperkalemia   Reevaluation:  After the interventions noted above, I reevaluated the patient and found that they have: improved   Disposition:  After consideration of the diagnostic results and the patients response to treatment,  I feel that the patent would benefit from admission.          Final Clinical Impression(s) / ED Diagnoses Final diagnoses:  ESRD (end stage renal disease) ([place])    Rx / DC Orders ED Discharge Orders     None         Valarie Merino, MD 12/25/21 2044

## 2021-12-25 NOTE — H&P (Signed)
History and Physical    [name] XLK:[id number] DOB: March 25, 1976 DOA: 12/25/2021  DOS: the patient was seen and examined on 12/25/2021  PCP: Haydee Salter, MD   Patient coming from: Home  I have personally briefly reviewed patient's old medical records in [facility]  Chief complaint: Dialysis catheter fell out History present illness: 46 year old African-American male history of morbid obesity, end-stage renal disease on hemodialysis on Tuesday Thursday Saturday via right femoral Vas-Cath, chronic marijuana abuse, presents to the ER today after he woke up finding his right femoral dialysis catheter in his bed.  He states that it was placed by vascular surgery yesterday.  He states that he went to hemodialysis.  He states that during his session, he was given IV iron and a clotted his catheter off.  He only had a 2-hour session.  He went home.  He woke up this morning and found his dialysis catheter in his bed.  There was minimal bleeding.  He states that he needs to have his catheter changed out almost once a month due to a clotting off.  Patient called his vascular surgeon who instructed him to go to the ER since he just had his catheter placed.  [facility] hospitalist contacted for admission.  Patient is otherwise asymptomatic.  EDP is contacted nephrology.  Patient given 10 mg of Lokelma due to a potassium of 5.7.   ED Course: EDP notified nephrology of pt's need for vascular access. Pt give 10 mg po lokelma for serum potassium of 5.7  Review of Systems:  Review of Systems  Constitutional: Negative.   HENT: Negative.    Eyes: Negative.   Respiratory: Negative.    Cardiovascular: Negative.   Gastrointestinal: Negative.   Genitourinary: Negative.   Musculoskeletal: Negative.   Skin: Negative.   Neurological: Negative.   Endo/Heme/Allergies:        Minimal bleeding from vascath site in right femoral/groin  Psychiatric/Behavioral: Negative.    All other systems  reviewed and are negative.   Past Medical History:  Diagnosis Date   Acute respiratory failure with hypoxia ([place]) 11/30/2018   Anemia    ESRD   ESRD on hemodialysis ([place]) 06/07/2011   TTS [place]. Started HD in 2006, got transplant in 2014 lasted until Dec 2019 then went back on HD.  Has L thigh AVG as of Jun 2020.  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    Hypertension    Morbid obesity ([place])    Paroxysmal atrial fibrillation ([place])    Sepsis ([place]) 11/30/2018   Sleep apnea    Type 2 diabetes mellitus with other diabetic kidney complication ([place]) 36/64/4034    Past Surgical History:  Procedure Laterality Date   ARTERIOVENOUS GRAFT PLACEMENT  08/09/2010   Left Thigh Graft by Dr. [name]   AV FISTULA PLACEMENT Right 05/18/2018   Procedure: INSERTION OF ARTERIOVENOUS (AV) GORE-TEX GRAFT Left THIGH;  Surgeon: Waynetta Sandy, MD;  Location: [place];  Service: Vascular;  Laterality: Right;   AV FISTULA PLACEMENT Right 02/15/2021   Procedure: INSERTION OF ARTERIOVENOUS (AV) GORE-TEX LOOP GRAFT RIGHT THIGH;  Surgeon: Waynetta Sandy, MD;  Location: [place];  Service: Vascular;  Laterality: Right;   CAPD REMOVAL  05/08/2011   Procedure: CONTINUOUS AMBULATORY PERITONEAL DIALYSIS  (CAPD) CATHETER REMOVAL;  Surgeon: [name], MD;  Location: [place];  Service: General;  Laterality: N/A;  Removal of CAPD catheter, Dr. requests to go after 100   INSERTION OF  DIALYSIS CATHETER  10/05/2010   Right Femoral Cath insertion by Dr. [name].  Pt ahas had several caths inserted.   INSERTION OF DIALYSIS CATHETER Right 02/15/2021   Procedure: Attempted INSERTION OF RIGHT and Left Internal Jugular DIALYSIS CATHETER, Insertion of Left Femoral Vein Dialysis Catheter;  Surgeon: Waynetta Sandy, MD;  Location: [place];  Service: Vascular;  Laterality: Right;   INSERTION OF DIALYSIS CATHETER Right 04/26/2021   Procedure: INSERTION OF TUNNELED 55cm PALIDROME  PRECISION CHRONIC DIALYSIS CATHETER;  Surgeon: Cherre Robins, MD;  Location: [place];  Service: Vascular;  Laterality: Right;   IR FLUORO GUIDE CV LINE RIGHT  05/11/2018   IR FLUORO GUIDE CV LINE RIGHT  07/03/2021   IR FLUORO GUIDE CV LINE RIGHT  07/16/2021   IR PTA ADDL CENTRAL DIALYSIS SEG THRU DIALY CIRCUIT RIGHT Right 07/03/2021   IR US GUIDE VASC ACCESS RIGHT  05/11/2018   IR VENOCAVAGRAM IVC  07/16/2021   KIDNEY TRANSPLANT  2014   KNEE ARTHROSCOPY Left    THROMBECTOMY W/ EMBOLECTOMY Right 04/26/2021   Procedure: REMOVAL OF RIGHT THIGH ARTERIOVENOUS GORE-TEX GRAFT AND REPAIR OF RIGHT COMMON FEMORAL ARTERY;  Surgeon: Cherre Robins, MD;  Location: [place];  Service: Vascular;  Laterality: Right;   UPPER EXTREMITY ANGIOGRAPHY Bilateral 05/13/2018   Procedure: UPPER EXTREMITY ANGIOGRAPHY - bilarteral;  Surgeon: Marty Heck, MD;  Location: [place] [facility];  Service: Cardiovascular;  Laterality: Bilateral;     reports that he quit smoking about 6 years ago. His smoking use included cigarettes. He has never used smokeless tobacco. He reports current alcohol use. He reports current drug use. Frequency: 10.00 times per week. Drug: Marijuana.  No Known Allergies  Family History  Problem Relation Age of Onset   Diabetes Father    Stroke Father    Stroke Maternal Grandmother    Anesthesia problems Neg Hx     Prior to Admission medications   Medication Sig Start Date End Date Taking? Authorizing Provider  apixaban (ELIQUIS) 5 MG TABS tablet Take 5 mg by mouth 2 (two) times daily. 10/10/21  Yes [provider]  atorvastatin (LIPITOR) 40 MG tablet Take 1 tablet (40 mg total) by mouth daily. 12/20/21  Yes Haydee Salter, MD  ferric citrate (AURYXIA) 1 GM 210 MG(Fe) tablet Take 630 mg by mouth 3 (three) times daily with meals. 09/06/19  Yes [provider]    Physical Exam: Vitals:   12/25/21 1807  BP: 104/70  Pulse: 73  Resp: 16  Temp: 98.2 F (36.8 C)  TempSrc:  Oral  SpO2: 100%    Physical Exam Vitals and nursing note reviewed.  Constitutional:      General: He is not in acute distress.    Appearance: Normal appearance. He is obese. He is not ill-appearing, toxic-appearing or diaphoretic.  HENT:     Head: Normocephalic and atraumatic.     Nose: Nose normal.  Eyes:     General: No scleral icterus. Cardiovascular:     Rate and Rhythm: Normal rate and regular rhythm.  Pulmonary:     Effort: Pulmonary effort is normal. No respiratory distress.     Breath sounds: Normal breath sounds. No wheezing.  Abdominal:     General: Bowel sounds are normal. There is no distension.     Tenderness: There is no abdominal tenderness.  Musculoskeletal:     Right lower leg: No edema.     Left lower leg: No edema.     Comments:  Prior right femoral vascath site without active bleeding.   Skin:    General: Skin is warm and dry.     Capillary Refill: Capillary refill takes less than 2 seconds.  Neurological:     General: No focal deficit present.     Mental Status: He is alert and oriented to person, place, and time.      Labs on Admission: I have personally reviewed following labs and imaging studies  CBC: Recent Labs  Lab 12/25/21 1831  WBC 4.6  NEUTROABS 2.5  HGB 10.9*  HCT 35.3*  MCV 94.6  PLT 921   Basic Metabolic Panel: Recent Labs  Lab 12/25/21 1831  NA 138  K 5.7*  CL 99  CO2 16*  GLUCOSE 93  BUN 122*  CREATININE 23.19*  CALCIUM 9.3   GFR: CrCl cannot be calculated (Unknown ideal weight.). Liver Function Tests: Recent Labs  Lab 12/25/21 1831  AST 11*  ALT 10  ALKPHOS 40  BILITOT 0.6  PROT 7.6  ALBUMIN 3.8   No results for input(s): "LIPASE", "AMYLASE" in the last 168 hours. No results for input(s): "AMMONIA" in the last 168 hours. Coagulation Profile: No results for input(s): "INR", "PROTIME" in the last 168 hours. Cardiac Enzymes: No results for input(s): "CKTOTAL", "CKMB", "CKMBINDEX", "TROPONINI",  "TROPONINIHS" in the last 168 hours. BNP (last 3 results) No results for input(s): "PROBNP" in the last 8760 hours. HbA1C: No results for input(s): "HGBA1C" in the last 72 hours. CBG: No results for input(s): "GLUCAP" in the last 168 hours. Lipid Profile: No results for input(s): "CHOL", "HDL", "LDLCALC", "TRIG", "CHOLHDL", "LDLDIRECT" in the last 72 hours. Thyroid Function Tests: No results for input(s): "TSH", "T4TOTAL", "FREET4", "T3FREE", "THYROIDAB" in the last 72 hours. Anemia Panel: No results for input(s): "VITAMINB12", "FOLATE", "FERRITIN", "TIBC", "IRON", "RETICCTPCT" in the last 72 hours. Urine analysis:    Component Value Date/Time   COLORURINE STRAW (A) 05/10/2018 1811   APPEARANCEUR CLEAR 05/10/2018 1811   LABSPEC 1.012 05/10/2018 1811   PHURINE 7.0 05/10/2018 1811   GLUCOSEU 150 (A) 05/10/2018 1811   HGBUR SMALL (A) 05/10/2018 1811   BILIRUBINUR NEGATIVE 11/03/2018 1515   KETONESUR NEGATIVE 05/10/2018 1811   PROTEINUR Positive (A) 11/03/2018 1515   PROTEINUR >=300 (A) 05/10/2018 1811   UROBILINOGEN 0.2 11/03/2018 1515   NITRITE NEGATIVE 11/03/2018 1515   NITRITE NEGATIVE 05/10/2018 1811   LEUKOCYTESUR Small (1+) (A) 11/03/2018 1515    Radiological Exams on Admission: I have personally reviewed images No results found.  EKG: My personal interpretation of EKG shows: shows NSR.      Assessment/Plan Principal Problem:   Complication of vascular dialysis catheter Active Problems:   Hyperkalemia   Essential hypertension   Morbid obesity ([place])   ESRD on dialysis ([place])   Paroxysmal atrial fibrillation ([place])   Marijuana use, continuous    Assessment and Plan: * Complication of vascular dialysis catheter Observation MedSurg bed.  Nephrology aware of the patient's need for vascular access.  N.p.o. after midnight in case he needs a surgically placed catheter.  Hyperkalemia Patient given 10 mg of p.o. Lokelma in the ER.  Repeat chemistry in the morning.   Patient has a history of hyperkalemia.  Marijuana use, continuous Chronic.  Patient smokes marijuana almost every day.  Paroxysmal atrial fibrillation ([facility]) Stable.  Continue Eliquis.  ESRD on dialysis ([place]) Chronic.  Dialyzes on Tuesday Thursday Saturday.  Nephrology aware of the patient's admission.  Morbid obesity ([facility]) Chronic.  Essential hypertension Stable.Patient does not take  any antihypertensives.   DVT prophylaxis: Eliquis Code Status: Full Code Family Communication: no family at bedside  Disposition Plan: return home  Consults called: EDP has consulted nephrology  Admission status: Observation, Med-Surg   Kristopher Oppenheim, DO [facility] 12/25/2021, 8:28 PM

## 2021-12-25 NOTE — Subjective & Objective (Signed)
Chief complaint: Dialysis catheter fell out History present illness: 46 year old African-American male history of morbid obesity, end-stage renal disease on hemodialysis on Tuesday Thursday Saturday via right femoral Vas-Cath, chronic marijuana abuse, presents to the ER today after he woke up finding his right femoral dialysis catheter in his bed.  He states that it was placed by vascular surgery yesterday.  He states that he went to hemodialysis.  He states that during his session, he was given IV iron and a clotted his catheter off.  He only had a 2-hour session.  He went home.  He woke up this morning and found his dialysis catheter in his bed.  There was minimal bleeding.  He states that he needs to have his catheter changed out almost once a month due to a clotting off.  Patient called his vascular surgeon who instructed him to go to the ER since he just had his catheter placed.  [facility] hospitalist contacted for admission.  Patient is otherwise asymptomatic.  EDP is contacted nephrology.  Patient given 10 mg of Lokelma due to a potassium of 5.7.

## 2021-12-26 ENCOUNTER — Observation Stay (HOSPITAL_COMMUNITY): Payer: Medicare HMO | Admitting: Certified Registered Nurse Anesthetist

## 2021-12-26 ENCOUNTER — Observation Stay (HOSPITAL_COMMUNITY): Payer: Medicare HMO

## 2021-12-26 ENCOUNTER — Other Ambulatory Visit: Payer: Self-pay

## 2021-12-26 ENCOUNTER — Encounter (HOSPITAL_COMMUNITY)
Admission: EM | Discharge status: Against Medical Advice | Payer: Self-pay | Source: Home / Self Care | Attending: Family Medicine

## 2021-12-26 ENCOUNTER — Encounter (HOSPITAL_COMMUNITY): Payer: Self-pay | Admitting: Internal Medicine

## 2021-12-26 DIAGNOSIS — Y712 Prosthetic and other implants, materials and accessory cardiovascular devices associated with adverse incidents: Secondary | ICD-10-CM | POA: Diagnosis present

## 2021-12-26 DIAGNOSIS — I871 Compression of vein: Secondary | ICD-10-CM

## 2021-12-26 DIAGNOSIS — Z6841 Body Mass Index (BMI) 40.0 and over, adult: Secondary | ICD-10-CM | POA: Diagnosis not present

## 2021-12-26 DIAGNOSIS — F1210 Cannabis abuse, uncomplicated: Secondary | ICD-10-CM | POA: Diagnosis present

## 2021-12-26 DIAGNOSIS — Z86718 Personal history of other venous thrombosis and embolism: Secondary | ICD-10-CM | POA: Diagnosis not present

## 2021-12-26 DIAGNOSIS — Z992 Dependence on renal dialysis: Secondary | ICD-10-CM

## 2021-12-26 DIAGNOSIS — T8241XA Breakdown (mechanical) of vascular dialysis catheter, initial encounter: Secondary | ICD-10-CM | POA: Diagnosis present

## 2021-12-26 DIAGNOSIS — N186 End stage renal disease: Secondary | ICD-10-CM

## 2021-12-26 DIAGNOSIS — Z86711 Personal history of pulmonary embolism: Secondary | ICD-10-CM | POA: Diagnosis not present

## 2021-12-26 DIAGNOSIS — Z79899 Other long term (current) drug therapy: Secondary | ICD-10-CM | POA: Diagnosis not present

## 2021-12-26 DIAGNOSIS — I9589 Other hypotension: Secondary | ICD-10-CM | POA: Diagnosis present

## 2021-12-26 DIAGNOSIS — I480 Paroxysmal atrial fibrillation: Secondary | ICD-10-CM | POA: Diagnosis present

## 2021-12-26 DIAGNOSIS — I132 Hypertensive heart and chronic kidney disease with heart failure and with stage 5 chronic kidney disease, or end stage renal disease: Secondary | ICD-10-CM | POA: Diagnosis not present

## 2021-12-26 DIAGNOSIS — I120 Hypertensive chronic kidney disease with stage 5 chronic kidney disease or end stage renal disease: Secondary | ICD-10-CM | POA: Diagnosis present

## 2021-12-26 DIAGNOSIS — N185 Chronic kidney disease, stage 5: Secondary | ICD-10-CM | POA: Diagnosis not present

## 2021-12-26 DIAGNOSIS — E1122 Type 2 diabetes mellitus with diabetic chronic kidney disease: Secondary | ICD-10-CM

## 2021-12-26 DIAGNOSIS — T8612 Kidney transplant failure: Secondary | ICD-10-CM | POA: Diagnosis present

## 2021-12-26 DIAGNOSIS — E875 Hyperkalemia: Secondary | ICD-10-CM | POA: Diagnosis present

## 2021-12-26 DIAGNOSIS — Z7901 Long term (current) use of anticoagulants: Secondary | ICD-10-CM | POA: Diagnosis not present

## 2021-12-26 DIAGNOSIS — T8242XA Displacement of vascular dialysis catheter, initial encounter: Secondary | ICD-10-CM

## 2021-12-26 DIAGNOSIS — G4730 Sleep apnea, unspecified: Secondary | ICD-10-CM | POA: Diagnosis present

## 2021-12-26 DIAGNOSIS — I509 Heart failure, unspecified: Secondary | ICD-10-CM

## 2021-12-26 DIAGNOSIS — Z833 Family history of diabetes mellitus: Secondary | ICD-10-CM | POA: Diagnosis not present

## 2021-12-26 DIAGNOSIS — Z87891 Personal history of nicotine dependence: Secondary | ICD-10-CM | POA: Diagnosis not present

## 2021-12-26 HISTORY — PX: INSERTION OF DIALYSIS CATHETER: SHX1324

## 2021-12-26 LAB — HEPATITIS B SURFACE ANTIGEN: Hepatitis B Surface Ag: NONREACTIVE

## 2021-12-26 LAB — COMPREHENSIVE METABOLIC PANEL
ALT: 8 U/L (ref 0–44)
AST: 7 U/L — ABNORMAL LOW (ref 15–41)
Albumin: 3.5 g/dL (ref 3.5–5.0)
Alkaline Phosphatase: 33 U/L — ABNORMAL LOW (ref 38–126)
Anion gap: 25 — ABNORMAL HIGH (ref 5–15)
BUN: 130 mg/dL — ABNORMAL HIGH (ref 6–20)
CO2: 16 mmol/L — ABNORMAL LOW (ref 22–32)
Calcium: 8.9 mg/dL (ref 8.9–10.3)
Chloride: 97 mmol/L — ABNORMAL LOW (ref 98–111)
Creatinine, Ser: 24.58 mg/dL — ABNORMAL HIGH (ref 0.61–1.24)
GFR, Estimated: 2 mL/min — ABNORMAL LOW (ref >60)
Glucose, Bld: 74 mg/dL (ref 70–99)
Potassium: 5.9 mmol/L — ABNORMAL HIGH (ref 3.5–5.1)
Sodium: 138 mmol/L (ref 135–145)
Total Bilirubin: 0.8 mg/dL (ref 0.3–1.2)
Total Protein: 7.4 g/dL (ref 6.5–8.1)

## 2021-12-26 LAB — SURGICAL PCR SCREEN
MRSA, PCR: POSITIVE — AB
Staphylococcus aureus: POSITIVE — AB

## 2021-12-26 LAB — GLUCOSE, CAPILLARY: Glucose-Capillary: 83 mg/dL (ref 70–99)

## 2021-12-26 LAB — HEPATITIS B SURFACE ANTIBODY,QUALITATIVE: Hep B S Ab: REACTIVE — AB

## 2021-12-26 SURGERY — INSERTION OF DIALYSIS CATHETER
Anesthesia: General | Site: Groin | Laterality: Left

## 2021-12-26 MED ORDER — OXYCODONE HCL 5 MG/5ML PO SOLN: 5.0000 mg | Freq: Once | ORAL | Status: DC | PRN

## 2021-12-26 MED ORDER — CEFAZOLIN SODIUM-DEXTROSE 2-3 GM-%(50ML) IV SOLR
INTRAVENOUS | Status: DC | PRN
  Administered 2021-12-26: 2 g via INTRAVENOUS

## 2021-12-26 MED ORDER — PHENYLEPHRINE HCL-NACL 20-0.9 MG/250ML-% IV SOLN
INTRAVENOUS | Status: DC | PRN
  Administered 2021-12-26: 40 ug/min via INTRAVENOUS

## 2021-12-26 MED ORDER — DOXERCALCIFEROL 4 MCG/2ML IV SOLN: 5.0000 ug | INTRAVENOUS | Status: DC

## 2021-12-26 MED ORDER — LIDOCAINE-EPINEPHRINE 1 %-1:100000 IJ SOLN
INTRAMUSCULAR | Status: DC | PRN
  Administered 2021-12-26: 6 mL via INTRADERMAL

## 2021-12-26 MED ORDER — LIDOCAINE HCL (PF) 1 % IJ SOLN
INTRAMUSCULAR | Status: AC
  Filled 2021-12-26: qty 30

## 2021-12-26 MED ORDER — CHLORHEXIDINE GLUCONATE CLOTH 2 % EX PADS: 6.0000 | MEDICATED_PAD | Freq: Every day | CUTANEOUS | Status: DC

## 2021-12-26 MED ORDER — ALTEPLASE 2 MG IJ SOLR: 2.0000 mg | Freq: Once | INTRAMUSCULAR | Status: DC | PRN

## 2021-12-26 MED ORDER — CHLORHEXIDINE GLUCONATE 0.12 % MT SOLN
OROMUCOSAL | Status: AC
  Administered 2021-12-26: 15 mL via OROMUCOSAL
  Filled 2021-12-26: qty 15

## 2021-12-26 MED ORDER — VASOPRESSIN 20 UNIT/ML IV SOLN
INTRAVENOUS | Status: AC
  Filled 2021-12-26: qty 1

## 2021-12-26 MED ORDER — PROPOFOL 10 MG/ML IV BOLUS
INTRAVENOUS | Status: DC | PRN
  Administered 2021-12-26: 200 mg via INTRAVENOUS

## 2021-12-26 MED ORDER — FENTANYL CITRATE (PF) 250 MCG/5ML IJ SOLN
INTRAMUSCULAR | Status: DC | PRN
  Administered 2021-12-26 (×2): 50 ug via INTRAVENOUS
  Administered 2021-12-26 (×2): 25 ug via INTRAVENOUS

## 2021-12-26 MED ORDER — ONDANSETRON HCL 4 MG/2ML IJ SOLN: 4.0000 mg | Freq: Once | INTRAMUSCULAR | Status: DC | PRN

## 2021-12-26 MED ORDER — CEFAZOLIN SODIUM 1 G IJ SOLR
INTRAMUSCULAR | Status: AC
  Filled 2021-12-26: qty 20

## 2021-12-26 MED ORDER — SODIUM CHLORIDE 0.9 % IV SOLN: INTRAVENOUS | Status: DC

## 2021-12-26 MED ORDER — DARBEPOETIN ALFA 60 MCG/0.3ML IJ SOSY: 60.0000 ug | PREFILLED_SYRINGE | INTRAMUSCULAR | Status: DC

## 2021-12-26 MED ORDER — HEPARIN SODIUM (PORCINE) 1000 UNIT/ML IJ SOLN
INTRAMUSCULAR | Status: DC | PRN
  Administered 2021-12-26: 4200 [IU]

## 2021-12-26 MED ORDER — HEPARIN 6000 UNIT IRRIGATION SOLUTION
Status: AC
  Filled 2021-12-26: qty 500

## 2021-12-26 MED ORDER — ONDANSETRON HCL 4 MG/2ML IJ SOLN
INTRAMUSCULAR | Status: DC | PRN
  Administered 2021-12-26: 4 mg via INTRAVENOUS

## 2021-12-26 MED ORDER — CHLORHEXIDINE GLUCONATE 0.12 % MT SOLN: 15.0000 mL | Freq: Once | OROMUCOSAL | Status: AC

## 2021-12-26 MED ORDER — LIDOCAINE-EPINEPHRINE (PF) 1 %-1:200000 IJ SOLN
INTRAMUSCULAR | Status: AC
  Filled 2021-12-26: qty 30

## 2021-12-26 MED ORDER — HEPARIN SODIUM (PORCINE) 1000 UNIT/ML IJ SOLN
INTRAMUSCULAR | Status: AC
  Filled 2021-12-26: qty 10

## 2021-12-26 MED ORDER — MEPERIDINE HCL 25 MG/ML IJ SOLN: 6.2500 mg | INTRAMUSCULAR | Status: DC | PRN

## 2021-12-26 MED ORDER — OXYCODONE HCL 5 MG PO TABS: 5.0000 mg | ORAL_TABLET | Freq: Once | ORAL | Status: DC | PRN

## 2021-12-26 MED ORDER — PHENYLEPHRINE 80 MCG/ML (10ML) SYRINGE FOR IV PUSH (FOR BLOOD PRESSURE SUPPORT)
PREFILLED_SYRINGE | INTRAVENOUS | Status: DC | PRN
  Administered 2021-12-26 (×2): 80 ug via INTRAVENOUS
  Administered 2021-12-26: 160 ug via INTRAVENOUS

## 2021-12-26 MED ORDER — ACETAMINOPHEN 325 MG PO TABS: 325.0000 mg | ORAL_TABLET | ORAL | Status: DC | PRN

## 2021-12-26 MED ORDER — FENTANYL CITRATE (PF) 250 MCG/5ML IJ SOLN
INTRAMUSCULAR | Status: AC
  Filled 2021-12-26: qty 5

## 2021-12-26 MED ORDER — LIDOCAINE 2% (20 MG/ML) 5 ML SYRINGE
INTRAMUSCULAR | Status: DC | PRN
  Administered 2021-12-26: 100 mg via INTRAVENOUS

## 2021-12-26 MED ORDER — PROPOFOL 10 MG/ML IV BOLUS
INTRAVENOUS | Status: AC
  Filled 2021-12-26: qty 20

## 2021-12-26 MED ORDER — ACETAMINOPHEN 160 MG/5ML PO SOLN: 325.0000 mg | ORAL | Status: DC | PRN

## 2021-12-26 MED ORDER — 0.9 % SODIUM CHLORIDE (POUR BTL) OPTIME
TOPICAL | Status: DC | PRN
  Administered 2021-12-26: 1000 mL

## 2021-12-26 MED ORDER — HEPARIN SODIUM (PORCINE) 1000 UNIT/ML DIALYSIS
1000.0000 [IU] | INTRAMUSCULAR | Status: DC | PRN
  Filled 2021-12-26: qty 1

## 2021-12-26 MED ORDER — ORAL CARE MOUTH RINSE
15.0000 mL | Freq: Once | OROMUCOSAL | Status: AC
Start: 2021-12-26 — End: 2021-12-26

## 2021-12-26 MED ORDER — MUPIROCIN 2 % EX OINT: 1.0000 | TOPICAL_OINTMENT | Freq: Two times a day (BID) | CUTANEOUS | Status: DC

## 2021-12-26 MED ORDER — FENTANYL CITRATE (PF) 100 MCG/2ML IJ SOLN: 25.0000 ug | INTRAMUSCULAR | Status: DC | PRN

## 2021-12-26 MED ORDER — HEPARIN 6000 UNIT IRRIGATION SOLUTION
Status: DC | PRN
  Administered 2021-12-26: 1

## 2021-12-26 MED ORDER — IODIXANOL 320 MG/ML IV SOLN
INTRAVENOUS | Status: DC | PRN
  Administered 2021-12-26: 90 mL via INTRAVENOUS

## 2021-12-26 SURGICAL SUPPLY — 43 items
APL SKNCLS STERI-STRIP NONHPOA (GAUZE/BANDAGES/DRESSINGS)
BAG DECANTER FOR FLEXI CONT (MISCELLANEOUS) ×3 IMPLANT
BENZOIN TINCTURE PRP APPL 2/3 (GAUZE/BANDAGES/DRESSINGS) ×2 IMPLANT
BIOPATCH BLUE 3/4IN DISK W/1.5 (GAUZE/BANDAGES/DRESSINGS) ×1 IMPLANT
BIOPATCH RED 1 DISK 7.0 (GAUZE/BANDAGES/DRESSINGS) ×3 IMPLANT
CATH ANGIO 5F BER2 65CM (CATHETERS) ×1 IMPLANT
CATH PALINDROME-P 28CM W/VT (CATHETERS) ×1 IMPLANT
COVER PROBE W GEL 5X96 (DRAPES) ×3 IMPLANT
COVER SURGICAL LIGHT HANDLE (MISCELLANEOUS) ×3 IMPLANT
DRAPE C-ARM 42X72 X-RAY (DRAPES) ×3 IMPLANT
DRSG IV TEGADERM 3.5X4.5 STRL (GAUZE/BANDAGES/DRESSINGS) ×1 IMPLANT
DRSG TEGADERM 2-3/8X2-3/4 SM (GAUZE/BANDAGES/DRESSINGS) ×1 IMPLANT
GAUZE 4X4 16PLY ~~LOC~~+RFID DBL (SPONGE) ×3 IMPLANT
GAUZE SPONGE 4X4 12PLY STRL (GAUZE/BANDAGES/DRESSINGS) ×3 IMPLANT
GLIDEWIRE ADV .035X180CM (WIRE) ×1 IMPLANT
GLOVE BIO SURGEON STRL SZ8 (GLOVE) ×3 IMPLANT
GOWN STRL REUS W/ TWL LRG LVL3 (GOWN DISPOSABLE) ×2 IMPLANT
GOWN STRL REUS W/ TWL XL LVL3 (GOWN DISPOSABLE) ×2 IMPLANT
GOWN STRL REUS W/TWL LRG LVL3 (GOWN DISPOSABLE) ×2
GOWN STRL REUS W/TWL XL LVL3 (GOWN DISPOSABLE) ×2
GUIDEWIRE ANGLED .035X150CM (WIRE) ×1 IMPLANT
KIT BASIN OR (CUSTOM PROCEDURE TRAY) ×3 IMPLANT
KIT PALINDROME-P 55CM (CATHETERS) ×1 IMPLANT
KIT TURNOVER KIT B (KITS) ×3 IMPLANT
NDL 18GX1X1/2 (RX/OR ONLY) (NEEDLE) ×2 IMPLANT
NDL HYPO 25GX1X1/2 BEV (NEEDLE) ×2 IMPLANT
NEEDLE 18GX1X1/2 (RX/OR ONLY) (NEEDLE) ×2 IMPLANT
NEEDLE HYPO 25GX1X1/2 BEV (NEEDLE) ×2 IMPLANT
NS IRRIG 1000ML POUR BTL (IV SOLUTION) ×3 IMPLANT
PACK SURGICAL SETUP 50X90 (CUSTOM PROCEDURE TRAY) ×3 IMPLANT
PAD ARMBOARD 7.5X6 YLW CONV (MISCELLANEOUS) ×6 IMPLANT
SET MICROPUNCTURE 5F STIFF (MISCELLANEOUS) ×5 IMPLANT
SHEATH PINNACLE 5F 10CM (SHEATH) ×1 IMPLANT
SUT ETHILON 3 0 PS 1 (SUTURE) ×3 IMPLANT
SUT MNCRL AB 4-0 PS2 18 (SUTURE) ×3 IMPLANT
SYR 10ML LL (SYRINGE) ×3 IMPLANT
SYR 20ML LL LF (SYRINGE) ×6 IMPLANT
SYR CONTROL 10ML LL (SYRINGE) ×3 IMPLANT
TOWEL GREEN STERILE (TOWEL DISPOSABLE) ×3 IMPLANT
TOWEL GREEN STERILE FF (TOWEL DISPOSABLE) ×6 IMPLANT
WATER STERILE IRR 1000ML POUR (IV SOLUTION) ×3 IMPLANT
WIRE BENTSON .035X145CM (WIRE) ×1 IMPLANT
WIRE TORQFLEX AUST .018X40CM (WIRE) ×1 IMPLANT

## 2021-12-26 NOTE — Transfer of Care (Signed)
Immediate Anesthesia Transfer of Care Note  Patient: [name]  Procedure(s) Performed: INSERTION OF TUNNELED  DIALYSIS CATHETER LEFT FEMORAL ARTERY (Left: Groin)  Patient Location: PACU  Anesthesia Type:General  Level of Consciousness: awake, patient cooperative and responds to stimulation  Airway & Oxygen Therapy: Patient Spontanous Breathing and Patient connected to nasal cannula oxygen  Post-op Assessment: Report given to RN and Post -op Vital signs reviewed and stable  Post vital signs: Reviewed and stable  Last Vitals:  Vitals Value Taken Time  BP 125/75 12/26/21 1235  Temp    Pulse 71 12/26/21 1239  Resp 11 12/26/21 1239  SpO2 97 % 12/26/21 1239  Vitals shown include unvalidated device data.  Last Pain:  Vitals:   12/26/21 0948  TempSrc: Oral  PainSc: 0-No pain         Complications: No notable events documented.

## 2021-12-26 NOTE — Progress Notes (Signed)
Pt receives out-pt HD at [facility] on TTS. Will assist as needed.   Melven Sartorius Renal Navigator [phone]

## 2021-12-26 NOTE — Op Note (Signed)
    Patient name: [name] MRN: [id number] DOB: Sep 01, 1975 Sex: male  12/26/2021 Pre-operative Diagnosis: End-stage renal disease Post-operative diagnosis:  Same Surgeon:  Erlene Quan C. Donzetta Matters, MD Procedure Performed: 1.  Ultrasound-guided cannulation right common femoral vein with limited right lower extremity venography 2.  Ultrasound-guided cannulation left common femoral vein 3.  Central venography 4.  Placement of 28 cm left femoral vein tunneled dialysis catheter with ultrasound and fluoroscopic guidance  Indications: 46 year old male on dialysis via catheter that has now become dislodged and he is now indicated for replacement of catheter.  Findings: The right common femoral vein was subtotally occluded there was no runoff to the external iliac vein on the right and flow crossed over to the left side via collateralization and no catheter could be replaced on the right.  On the left side the external iliac vein and common iliac veins were patent the IVC was occluded and gave rise to the left renal vein but nothing led back to the right atrium.  If this catheter does not work patient does not have any further standard dialysis access procedures available.   Procedure:  The patient was identified in the holding area and taken to to the operating room was placed supine on the operative table and p general anesthesia was induced and he sterilely prepped draped bilateral groins in usual fashion, antibiotics were minister timeout was called.  We began with ultrasound-guided cannulation of the right common femoral vein.  Unfortunately could not get a wire to pass ultimately I was able to get a micropuncture sheath in place performed venogram which demonstrated crossover to the left side.  I then used ultrasound to cannulate the left common femoral vein.  I was able to get a Bentson wire to pass centrally and then placed a 5 [place] sheath.  I used a Berenstein catheter to perform central  venography this demonstrated occlusion of the suprarenal IVC.  I elected to place a shorter catheter.  I serially dilated over an Amplatz wire and then placed the introducer sheath.  I tunneled a 28 cm catheter from lateral and then placed this up to the level of the left common iliac vein.  This was affixed to the skin with 3-0 nylon suture.  The catheter was flushed and locked with 2.1 cc of concentrated heparin in either port.  Dermabond is placed at both sites and a sterile dressing was applied.  He was awakened from anesthesia having tolerated procedure without any complication.  Counts were correct at completion  EBL: 50cc    Pascuala Klutts C. Donzetta Matters, MD [facility] [place] Office: [phone] Pager: [phone]

## 2021-12-26 NOTE — H&P (View-Only) (Signed)
Hospital Consult    Reason for Consult: Dialysis access Requesting Physician: ED MRN #:  [id number]  History of Present Illness: This is a 46 y.o. male with past medical history significant for end-stage renal disease on hemodialysis.  He has been dialyzing via right femoral vein TDC.  He has had several dialysis access procedures in the past however has become catheter dependent over the years.  His last completed dialysis treatment was on Tuesday of this week 7/18.  Patient states his catheter completely came out from his right groin yesterday while laying in bed.  He denies any significant bleeding.  He is on Eliquis for atrial fibrillation.  Last dose of Eliquis was yesterday evening.  Past Medical History:  Diagnosis Date   Acute respiratory failure with hypoxia ([place]) 11/30/2018   Anemia    ESRD   ESRD on hemodialysis ([place]) 06/07/2011   TTS [place]. Started HD in 2006, got transplant in 2014 lasted until Dec 2019 then went back on HD.  Has L thigh AVG as of Jun 2020.  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    Hypertension    Morbid obesity ([place])    Paroxysmal atrial fibrillation ([place])    Sepsis ([place]) 11/30/2018   Sleep apnea    Type 2 diabetes mellitus with other diabetic kidney complication ([place]) 03/47/4259    Past Surgical History:  Procedure Laterality Date   ARTERIOVENOUS GRAFT PLACEMENT  08/09/2010   Left Thigh Graft by Dr. [name]   AV FISTULA PLACEMENT Right 05/18/2018   Procedure: INSERTION OF ARTERIOVENOUS (AV) GORE-TEX GRAFT Left THIGH;  Surgeon: Waynetta Sandy, MD;  Location: [place];  Service: Vascular;  Laterality: Right;   AV FISTULA PLACEMENT Right 02/15/2021   Procedure: INSERTION OF ARTERIOVENOUS (AV) GORE-TEX LOOP GRAFT RIGHT THIGH;  Surgeon: Waynetta Sandy, MD;  Location: [place];  Service: Vascular;  Laterality: Right;   CAPD REMOVAL  05/08/2011   Procedure: CONTINUOUS AMBULATORY PERITONEAL DIALYSIS  (CAPD) CATHETER  REMOVAL;  Surgeon: [name], MD;  Location: [place];  Service: General;  Laterality: N/A;  Removal of CAPD catheter, Dr. requests to go after [place]  10/05/2010   Right Femoral Cath insertion by Dr. [name].  Pt ahas had several caths inserted.   INSERTION OF DIALYSIS CATHETER Right 02/15/2021   Procedure: Attempted INSERTION OF RIGHT and Left Internal Jugular DIALYSIS CATHETER, Insertion of Left Femoral Vein Dialysis Catheter;  Surgeon: Waynetta Sandy, MD;  Location: [place];  Service: Vascular;  Laterality: Right;   INSERTION OF DIALYSIS CATHETER Right 04/26/2021   Procedure: INSERTION OF TUNNELED 55cm PALIDROME PRECISION CHRONIC DIALYSIS CATHETER;  Surgeon: Cherre Robins, MD;  Location: [place];  Service: Vascular;  Laterality: Right;   IR FLUORO GUIDE CV LINE RIGHT  05/11/2018   IR FLUORO GUIDE CV LINE RIGHT  07/03/2021   IR FLUORO GUIDE CV LINE RIGHT  07/16/2021   IR PTA ADDL CENTRAL DIALYSIS SEG THRU DIALY CIRCUIT RIGHT Right 07/03/2021   IR US GUIDE VASC ACCESS RIGHT  05/11/2018   IR VENOCAVAGRAM IVC  07/16/2021   KIDNEY TRANSPLANT  2014   KNEE ARTHROSCOPY Left    THROMBECTOMY W/ EMBOLECTOMY Right 04/26/2021   Procedure: REMOVAL OF RIGHT THIGH ARTERIOVENOUS GORE-TEX GRAFT AND REPAIR OF RIGHT COMMON FEMORAL ARTERY;  Surgeon: Cherre Robins, MD;  Location: [place];  Service: Vascular;  Laterality: Right;   UPPER EXTREMITY ANGIOGRAPHY Bilateral 05/13/2018   Procedure:  UPPER EXTREMITY ANGIOGRAPHY - bilarteral;  Surgeon: Marty Heck, MD;  Location: [place] [facility];  Service: Cardiovascular;  Laterality: Bilateral;    No Known Allergies  Prior to Admission medications   Medication Sig Start Date End Date Taking? Authorizing Provider  apixaban (ELIQUIS) 5 MG TABS tablet Take 5 mg by mouth 2 (two) times daily. 10/10/21  Yes [provider]  atorvastatin (LIPITOR) 40 MG tablet Take 1 tablet (40 mg total) by mouth daily. 12/20/21   Yes Haydee Salter, MD  ferric citrate (AURYXIA) 1 GM 210 MG(Fe) tablet Take 630 mg by mouth 3 (three) times daily with meals. 09/06/19  Yes [provider]    Social History   Socioeconomic History   Marital status: Single    Spouse name: Not on file   Number of children: 0   Years of education: Not on file   Highest education level: Not on file  Occupational History   Not on file  Tobacco Use   Smoking status: Former    Types: Cigarettes    Quit date: 11/08/2015    Years since quitting: 6.1   Smokeless tobacco: Never   Tobacco comments:    Smokes a cigarette about every 3-4 days.  Vaping Use   Vaping Use: Never used  Substance and Sexual Activity   Alcohol use: Yes    Comment: Socially   Drug use: Yes    Frequency: 10.0 times per week    Types: Marijuana    Comment: 1-2 joints per day   Sexual activity: Yes  Other Topics Concern   Not on file  Social History Narrative   Lives alone.    Social Determinants of Health   Financial Resource Strain: Low Risk  (08/14/2021)   Overall Financial Resource Strain (CARDIA)    Difficulty of Paying Living Expenses: Not hard at all  Food Insecurity: No Food Insecurity (08/14/2021)   Hunger Vital Sign    Worried About Running Out of Food in the Last Year: Never true    Ran Out of Food in the Last Year: Never true  Transportation Needs: No Transportation Needs (08/14/2021)   PRAPARE - Hydrologist (Medical): No    Lack of Transportation (Non-Medical): No  Physical Activity: Inactive (08/14/2021)   Exercise Vital Sign    Days of Exercise per Week: 0 days    Minutes of Exercise per Session: 0 min  Stress: No Stress Concern Present (08/14/2021)   [place]    Feeling of Stress : Not at all  Social Connections: Socially Isolated (08/14/2021)   Social Connection and Isolation Panel [NHANES]    Frequency of Communication with Friends and  Family: Twice a week    Frequency of Social Gatherings with Friends and Family: Twice a week    Attends Religious Services: Never    Marine scientist or Organizations: No    Attends Archivist Meetings: Never    Marital Status: Never married  Intimate Partner Violence: Not At Risk (08/14/2021)   Humiliation, Afraid, Rape, and Kick questionnaire    Fear of Current or Ex-Partner: No    Emotionally Abused: No    Physically Abused: No    Sexually Abused: No     Family History  Problem Relation Age of Onset   Diabetes Father    Stroke Father    Stroke Maternal Grandmother    Anesthesia problems Neg Hx  ROS: Otherwise negative unless mentioned in HPI  Physical Examination  Vitals:   12/26/21 0219 12/26/21 0832  BP: 106/73 102/71  Pulse: 71 67  Resp: 16 18  Temp: 97.7 F (36.5 C) 98.5 F (36.9 C)  SpO2: 100% 98%   There is no height or weight on file to calculate BMI.  General:  WDWN in NAD Gait: Not observed HENT: WNL, normocephalic Pulmonary: normal non-labored breathing, without Rales, rhonchi,  wheezing Cardiac: regular Abdomen:  soft, NT/ND, no masses Skin: without rashes Extremities: No firm hematoma noted in right groin Musculoskeletal: no muscle wasting or atrophy  Neurologic: A&O X 3;  No focal weakness or paresthesias are detected; speech is fluent/normal Psychiatric:  The pt has Normal affect. Lymph:  Unremarkable  CBC    Component Value Date/Time   WBC 4.6 12/25/2021 1831   RBC 3.73 (L) 12/25/2021 1831   HGB 10.9 (L) 12/25/2021 1831   HCT 35.3 (L) 12/25/2021 1831   PLT 165 12/25/2021 1831   MCV 94.6 12/25/2021 1831   MCH 29.2 12/25/2021 1831   MCHC 30.9 12/25/2021 1831   RDW 17.5 (H) 12/25/2021 1831   LYMPHSABS 1.0 12/25/2021 1831   MONOABS 0.8 12/25/2021 1831   EOSABS 0.3 12/25/2021 1831   BASOSABS 0.0 12/25/2021 1831    BMET    Component Value Date/Time   NA 138 12/25/2021 1831   NA 137 05/26/2018 0000   K 5.7 (H)  12/25/2021 1831   CL 99 12/25/2021 1831   CO2 16 (L) 12/25/2021 1831   GLUCOSE 93 12/25/2021 1831   BUN 122 (H) 12/25/2021 1831   CREATININE 23.19 (H) 12/25/2021 1831   CALCIUM 9.3 12/25/2021 1831   GFRNONAA 2 (L) 12/25/2021 1831   GFRAA 5 (L) 12/16/2018 1350    COAGS: Lab Results  Component Value Date   INR 1.5 (H) 11/30/2018   INR 1.21 05/11/2018   INR 1.16 05/10/2018       ASSESSMENT/PLAN: This is a 46 y.o. male without dialysis access; right femoral TDC was accidentally removed  -Plan will be for replacement of right femoral vein TDC today in the operating room by Dr. Stanford Breed -Continue n.p.o. status -Consent ordered -Dr. Stanford Breed also involved in the evaluation and management plan of this patient   Dagoberto Ligas PA-C [facility] [phone]   VASCULAR STAFF ADDENDUM: I have independently interviewed and examined the patient. I agree with the above.  Plan femoral tunneled dialysis catheter today in the OR.  Yevonne Aline. Stanford Breed, MD [facility] Phone Number: [phone] 12/26/2021 9:28 AM

## 2021-12-26 NOTE — Plan of Care (Signed)

## 2021-12-26 NOTE — Progress Notes (Signed)
PROGRESS NOTE   [name]  TTS:[id number] DOB: [birth year] DOA: 12/25/2021 PCP: Haydee Salter, MD  Brief Narrative:  46 year old male home dwelling DM TY 2 Super morbid obesity BMI 148 kg Remote history of paroxysmal A-fib currently in sinus LVEF suggestive of HFpEF Prior left great toe ulceration secondary to DM TY 2 ESRD/HD 2008-transplant kidney DDKT which failed in 2014-vasculopath with limited access-Long history of vasculopathy with AV fistulas and AV grafts to extremity which have failed as well as central venous stenosis veins in neck Has been following both locally and at [facility] by vascular surgery Dr. Wenda Overland Recent admission 2/6 through 07/16/2021 and had replacement of femoral HD cath-he signed out AMA at the time  Hospital-Problem based course  Displaced femoral catheter right side Patient is a vasculopath and has poor access with multiple failed fistulas and grafts-IR contacted me today and have informed me that they may not be able to get to him so we will ask vascular surgery who seen him before to consider placement of a temporary catheter He is hemodynamically stable and there is no significant bleeding from the area ESRD since 2008 2/2 HTN? Mild hyperkalemia on admission He has chronic hypotension at baseline Defer to renal for further planning-we will need to resume TTS schedule and may need to catch up HD on 7/20 or 7/21 if needed Given dosing of Lokelma on 7/19-repeat labs when HD is performed Not on binders?  May need outpatient check phosphorus, iron stores Borderline A1c Last A1c 12/11/2021 was 5.3 Needs outpatient supervision Prior DVT/PE Continues on Eliquis-I have held this today as may need operative management Prior right great toe ulceration BMI 148  DVT prophylaxis: Eliquis-->SCD Code Status: Presumed full Family Communication: None Disposition:  Status is: Observation The patient will require care spanning > 2 midnights and should  be moved to inpatient because:   Needs catheter placement   Consultants:  Vascular  Procedures: None currently  Antimicrobials: No   Subjective: No groin pain no overt severe bleeding No chest pain  Objective: Vitals:   12/25/21 2000 12/25/21 2100 12/25/21 2200 12/26/21 0219  BP: 95/73 107/79 116/85 106/73  Pulse: 67 82 61 71  Resp: 13 (!) '23 16 16  '$ Temp:   98.1 F (36.7 C) 97.7 F (36.5 C)  TempSrc:   Oral Axillary  SpO2: 100% 100% 100% 100%   No intake or output data in the 24 hours ending 12/26/21 0710 There were no vitals filed for this visit.  Examination:  Coherent alert no distress EOMI NCAT no focal deficit Chest is clear no rales rhonchi Abdomen is soft-groin exam shows bandage over the area with dark stained dried blood-no overt bleeding-wound not examined No lower extremity edema  Data Reviewed: personally reviewed   CBC    Component Value Date/Time   WBC 4.6 12/25/2021 1831   RBC 3.73 (L) 12/25/2021 1831   HGB 10.9 (L) 12/25/2021 1831   HCT 35.3 (L) 12/25/2021 1831   PLT 165 12/25/2021 1831   MCV 94.6 12/25/2021 1831   MCH 29.2 12/25/2021 1831   MCHC 30.9 12/25/2021 1831   RDW 17.5 (H) 12/25/2021 1831   LYMPHSABS 1.0 12/25/2021 1831   MONOABS 0.8 12/25/2021 1831   EOSABS 0.3 12/25/2021 1831   BASOSABS 0.0 12/25/2021 1831      Latest Ref Rng & Units 12/25/2021    6:31 PM 07/16/2021    5:00 AM 07/15/2021    6:29 PM  CMP  Glucose 70 - 99  mg/dL 93  81  75   BUN 6 - 20 mg/dL 122  87  83   Creatinine 0.61 - 1.24 mg/dL 23.19  24.10  23.55   Sodium 135 - 145 mmol/L 138  138  138   Potassium 3.5 - 5.1 mmol/L 5.7  5.4  5.7   Chloride 98 - 111 mmol/L 99  96  97   CO2 22 - 32 mmol/L '16  21  23   '$ Calcium 8.9 - 10.3 mg/dL 9.3  8.7  9.0   Total Protein 6.5 - 8.1 g/dL 7.6  7.0    Total Bilirubin 0.3 - 1.2 mg/dL 0.6  0.4    Alkaline Phos 38 - 126 U/L 40  29    AST 15 - 41 U/L 11  9    ALT 0 - 44 U/L 10  6       Radiology Studies: No results  found.   Scheduled Meds:  apixaban  5 mg Oral BID   atorvastatin  40 mg Oral Daily   Continuous Infusions:   LOS: 0 days   Time spent: 63  Nita Sells, MD [facility] To contact the attending provider between 7A-7P or the covering provider during after hours 7P-7A, please log into the web site [email or web address] and access using universal [place] password for that web site. If you do not have the password, please call the hospital operator.  12/26/2021, 7:10 AM

## 2021-12-26 NOTE — Progress Notes (Addendum)
Brief nephrology note  Complicated vascular access undergoes dialysis Tuesday Thursday Saturday at [place].  Says he got a new dialysis catheter Tuesday but was not stitched inappropriately.  Got a couple hours of dialysis yesterday but then his dialysis catheter came out.  Dialysis catheter is out completely at this time.  Bleeding has been minimal.  He has required multiple catheter exchanges and failed access creations. VVS consulted for help. Has gotten minimal HD over last two weeks.  Patient was seen and evaluated.  No acute issues at this time.  Mild hyperkalemia and received Lokelma.  BUN fairly high.  Possible mild uremic symptoms.  Seen as observation.  We will plan for dialysis once dialysis access has been obtained.  OP prescription: TTS, F250, BFR 500, DFR 500, EDW 122.5, 2K, 2Ca, Mircera 67mg q2 weeks (last given 7/11), hectorol 582m w/ treatment, parsabiv 7.'5mg'$  with treatments.

## 2021-12-26 NOTE — Interval H&P Note (Signed)
History and Physical Interval Note:  12/26/2021 10:23 AM  [name]  has presented today for surgery, with the diagnosis of ESRD.  The various methods of treatment have been discussed with the patient and family. After consideration of risks, benefits and other options for treatment, the patient has consented to  Procedure(s): INSERTION OF TUNNELED  DIALYSIS CATHETER (N/A) as a surgical intervention.  The patient's history has been reviewed, patient examined, no change in status, stable for surgery.  I have reviewed the patient's chart and labs.  Questions were answered to the patient's satisfaction.     Servando Snare

## 2021-12-26 NOTE — Anesthesia Procedure Notes (Signed)
Procedure Name: LMA Insertion Date/Time: 12/26/2021 11:07 AM  Performed by: Betha Loa, CRNAPre-anesthesia Checklist: Patient identified, Emergency Drugs available, Suction available and Patient being monitored Patient Re-evaluated:Patient Re-evaluated prior to induction Oxygen Delivery Method: Circle System Utilized Preoxygenation: Pre-oxygenation with 100% oxygen Induction Type: IV induction Ventilation: Mask ventilation without difficulty LMA: LMA inserted LMA Size: 5.0 Number of attempts: 1 Airway Equipment and Method: Bite block Placement Confirmation: positive ETCO2 Tube secured with: Tape Dental Injury: Teeth and Oropharynx as per pre-operative assessment

## 2021-12-26 NOTE — Progress Notes (Signed)
When pt arrived at the unit, the patient asked if he would be discharged this evening. According to pt, Dr. Stanford Breed told him he would go home after dialysis this evening. This Probation officer contacted the hospitalist on call, Dr. Hal Hope, who stated that he would not recommend d/c tonight, and the attending's note does not recommend discharge of pt. RN advised the patient to remain in the hospital until tomorrow for closer monitoring, and he will see the attending medical doctor in the morning. Pt decided to leave AMA, and he verbalized an understanding of the risks of leaving the hospital against medical advice. Pt signed leaving the hospital against the medical advice form. Pt's questions were addressed

## 2021-12-26 NOTE — Anesthesia Preprocedure Evaluation (Addendum)
Anesthesia Evaluation  Patient identified by MRN, date of birth, ID band Patient awake    Reviewed: Allergy & Precautions, H&P , NPO status , Patient's Chart, lab work & pertinent test results, reviewed documented beta blocker date and time   Airway Mallampati: I  TM Distance: >3 FB Neck ROM: Full    Dental no notable dental hx. (+) Poor Dentition, Dental Advisory Given, Chipped, Missing   Pulmonary neg pulmonary ROS, sleep apnea , former smoker,    Pulmonary exam normal breath sounds clear to auscultation       Cardiovascular Exercise Tolerance: Good hypertension, Pt. on medications +CHF  Normal cardiovascular exam+ dysrhythmias Atrial Fibrillation  Rhythm:Regular Rate:Normal  Dobutamine 21' 1. Fair exercise capacity, achieved 8.6 METS  2. Hypertensive response to exercise  3. Upsloping ST depression with exercise, no evidence of ischemia.  4. This is a negative stress echocardiogram for ischemia.  5. This is a low risk study.   ECHO 20' EF nl with normal valves.   Neuro/Psych  Neuromuscular disease negative psych ROS   GI/Hepatic negative GI ROS, Neg liver ROS,   Endo/Other  diabetes, Type 2Morbid obesity  Renal/GU ESRF and DialysisRenal disease  negative genitourinary   Musculoskeletal negative musculoskeletal ROS (+)   Abdominal   Peds negative pediatric ROS (+)  Hematology negative hematology ROS (+) Blood dyscrasia, anemia ,   Anesthesia Other Findings   Reproductive/Obstetrics negative OB ROS                            Anesthesia Physical Anesthesia Plan  ASA: 4  Anesthesia Plan: General   Post-op Pain Management: Minimal or no pain anticipated   Induction: Intravenous  PONV Risk Score and Plan: 2 and Ondansetron and Treatment may vary due to age or medical condition  Airway Management Planned: LMA  Additional Equipment: None  Intra-op Plan:   Post-operative  Plan: Extubation in OR  Informed Consent: I have reviewed the patients History and Physical, chart, labs and discussed the procedure including the risks, benefits and alternatives for the proposed anesthesia with the patient or authorized representative who has indicated his/her understanding and acceptance.     Dental Advisory Given  Plan Discussed with: CRNA and Anesthesiologist  Anesthesia Plan Comments: (Chief complaint: Dialysis catheter fell out History present illness: 46 year old African-American male history of morbid obesity, end-stage renal disease on hemodialysis on Tuesday Thursday Saturday via right femoral Vas-Cath, chronic marijuana abuse, presents to the ER today after he woke up finding his right femoral dialysis catheter in his bed.  He states that it was placed by vascular surgery yesterday.  He states that he went to hemodialysis.  He states that during his session, he was given IV iron and a clotted his catheter off.  He only had a 2-hour session.  He went home.  He woke up this morning and found his dialysis catheter in his bed.  There was minimal bleeding.  He states that he needs to have his catheter changed out almost once a month due to a clotting off.)       Anesthesia Quick Evaluation

## 2021-12-26 NOTE — Consult Note (Addendum)
Hospital Consult    Reason for Consult: Dialysis access Requesting Physician: ED MRN #:  [id number]  History of Present Illness: This is a 46 y.o. male with past medical history significant for end-stage renal disease on hemodialysis.  He has been dialyzing via right femoral vein TDC.  He has had several dialysis access procedures in the past however has become catheter dependent over the years.  His last completed dialysis treatment was on Tuesday of this week 7/18.  Patient states his catheter completely came out from his right groin yesterday while laying in bed.  He denies any significant bleeding.  He is on Eliquis for atrial fibrillation.  Last dose of Eliquis was yesterday evening.  Past Medical History:  Diagnosis Date   Acute respiratory failure with hypoxia ([place]) 11/30/2018   Anemia    ESRD   ESRD on hemodialysis ([place]) 06/07/2011   TTS [place]. Started HD in 2006, got transplant in 2014 lasted until Dec 2019 then went back on HD.  Has L thigh AVG as of Jun 2020.  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    Hypertension    Morbid obesity ([place])    Paroxysmal atrial fibrillation ([place])    Sepsis ([place]) 11/30/2018   Sleep apnea    Type 2 diabetes mellitus with other diabetic kidney complication ([place]) 69/48/5462    Past Surgical History:  Procedure Laterality Date   ARTERIOVENOUS GRAFT PLACEMENT  08/09/2010   Left Thigh Graft by Dr. [name]   AV FISTULA PLACEMENT Right 05/18/2018   Procedure: INSERTION OF ARTERIOVENOUS (AV) GORE-TEX GRAFT Left THIGH;  Surgeon: Waynetta Sandy, MD;  Location: [place];  Service: Vascular;  Laterality: Right;   AV FISTULA PLACEMENT Right 02/15/2021   Procedure: INSERTION OF ARTERIOVENOUS (AV) GORE-TEX LOOP GRAFT RIGHT THIGH;  Surgeon: Waynetta Sandy, MD;  Location: [place];  Service: Vascular;  Laterality: Right;   CAPD REMOVAL  05/08/2011   Procedure: CONTINUOUS AMBULATORY PERITONEAL DIALYSIS  (CAPD) CATHETER  REMOVAL;  Surgeon: [name], MD;  Location: [facility];  Service: General;  Laterality: N/A;  Removal of CAPD catheter, Dr. requests to go after [place]  10/05/2010   Right Femoral Cath insertion by Dr. [name].  Pt ahas had several caths inserted.   INSERTION OF DIALYSIS CATHETER Right 02/15/2021   Procedure: Attempted INSERTION OF RIGHT and Left Internal Jugular DIALYSIS CATHETER, Insertion of Left Femoral Vein Dialysis Catheter;  Surgeon: Waynetta Sandy, MD;  Location: [place];  Service: Vascular;  Laterality: Right;   INSERTION OF DIALYSIS CATHETER Right 04/26/2021   Procedure: INSERTION OF TUNNELED 55cm PALIDROME PRECISION CHRONIC DIALYSIS CATHETER;  Surgeon: Cherre Robins, MD;  Location: [place];  Service: Vascular;  Laterality: Right;   IR FLUORO GUIDE CV LINE RIGHT  05/11/2018   IR FLUORO GUIDE CV LINE RIGHT  07/03/2021   IR FLUORO GUIDE CV LINE RIGHT  07/16/2021   IR PTA ADDL CENTRAL DIALYSIS SEG THRU DIALY CIRCUIT RIGHT Right 07/03/2021   IR US GUIDE VASC ACCESS RIGHT  05/11/2018   IR VENOCAVAGRAM IVC  07/16/2021   KIDNEY TRANSPLANT  2014   KNEE ARTHROSCOPY Left    THROMBECTOMY W/ EMBOLECTOMY Right 04/26/2021   Procedure: REMOVAL OF RIGHT THIGH ARTERIOVENOUS GORE-TEX GRAFT AND REPAIR OF RIGHT COMMON FEMORAL ARTERY;  Surgeon: Cherre Robins, MD;  Location: [place];  Service: Vascular;  Laterality: Right;   UPPER EXTREMITY ANGIOGRAPHY Bilateral 05/13/2018   Procedure:  UPPER EXTREMITY ANGIOGRAPHY - bilarteral;  Surgeon: Marty Heck, MD;  Location: [facility];  Service: Cardiovascular;  Laterality: Bilateral;    No Known Allergies  Prior to Admission medications   Medication Sig Start Date End Date Taking? Authorizing Provider  apixaban (ELIQUIS) 5 MG TABS tablet Take 5 mg by mouth 2 (two) times daily. 10/10/21  Yes [provider]  atorvastatin (LIPITOR) 40 MG tablet Take 1 tablet (40 mg total) by mouth daily. 12/20/21   Yes Haydee Salter, MD  ferric citrate (AURYXIA) 1 GM 210 MG(Fe) tablet Take 630 mg by mouth 3 (three) times daily with meals. 09/06/19  Yes [provider]    Social History   Socioeconomic History   Marital status: Single    Spouse name: Not on file   Number of children: 0   Years of education: Not on file   Highest education level: Not on file  Occupational History   Not on file  Tobacco Use   Smoking status: Former    Types: Cigarettes    Quit date: 11/08/2015    Years since quitting: 6.1   Smokeless tobacco: Never   Tobacco comments:    Smokes a cigarette about every 3-4 days.  Vaping Use   Vaping Use: Never used  Substance and Sexual Activity   Alcohol use: Yes    Comment: Socially   Drug use: Yes    Frequency: 10.0 times per week    Types: Marijuana    Comment: 1-2 joints per day   Sexual activity: Yes  Other Topics Concern   Not on file  Social History Narrative   Lives alone.    Social Determinants of Health   Financial Resource Strain: Low Risk  (08/14/2021)   Overall Financial Resource Strain (CARDIA)    Difficulty of Paying Living Expenses: Not hard at all  Food Insecurity: No Food Insecurity (08/14/2021)   Hunger Vital Sign    Worried About Running Out of Food in the Last Year: Never true    Ran Out of Food in the Last Year: Never true  Transportation Needs: No Transportation Needs (08/14/2021)   PRAPARE - Hydrologist (Medical): No    Lack of Transportation (Non-Medical): No  Physical Activity: Inactive (08/14/2021)   Exercise Vital Sign    Days of Exercise per Week: 0 days    Minutes of Exercise per Session: 0 min  Stress: No Stress Concern Present (08/14/2021)   [place]    Feeling of Stress : Not at all  Social Connections: Socially Isolated (08/14/2021)   Social Connection and Isolation Panel [NHANES]    Frequency of Communication with Friends and  Family: Twice a week    Frequency of Social Gatherings with Friends and Family: Twice a week    Attends Religious Services: Never    Marine scientist or Organizations: No    Attends Archivist Meetings: Never    Marital Status: Never married  Intimate Partner Violence: Not At Risk (08/14/2021)   Humiliation, Afraid, Rape, and Kick questionnaire    Fear of Current or Ex-Partner: No    Emotionally Abused: No    Physically Abused: No    Sexually Abused: No     Family History  Problem Relation Age of Onset   Diabetes Father    Stroke Father    Stroke Maternal Grandmother    Anesthesia problems Neg Hx  ROS: Otherwise negative unless mentioned in HPI  Physical Examination  Vitals:   12/26/21 0219 12/26/21 0832  BP: 106/73 102/71  Pulse: 71 67  Resp: 16 18  Temp: 97.7 F (36.5 C) 98.5 F (36.9 C)  SpO2: 100% 98%   There is no height or weight on file to calculate BMI.  General:  WDWN in NAD Gait: Not observed HENT: WNL, normocephalic Pulmonary: normal non-labored breathing, without Rales, rhonchi,  wheezing Cardiac: regular Abdomen:  soft, NT/ND, no masses Skin: without rashes Extremities: No firm hematoma noted in right groin Musculoskeletal: no muscle wasting or atrophy  Neurologic: A&O X 3;  No focal weakness or paresthesias are detected; speech is fluent/normal Psychiatric:  The pt has Normal affect. Lymph:  Unremarkable  CBC    Component Value Date/Time   WBC 4.6 12/25/2021 1831   RBC 3.73 (L) 12/25/2021 1831   HGB 10.9 (L) 12/25/2021 1831   HCT 35.3 (L) 12/25/2021 1831   PLT 165 12/25/2021 1831   MCV 94.6 12/25/2021 1831   MCH 29.2 12/25/2021 1831   MCHC 30.9 12/25/2021 1831   RDW 17.5 (H) 12/25/2021 1831   LYMPHSABS 1.0 12/25/2021 1831   MONOABS 0.8 12/25/2021 1831   EOSABS 0.3 12/25/2021 1831   BASOSABS 0.0 12/25/2021 1831    BMET    Component Value Date/Time   NA 138 12/25/2021 1831   NA 137 05/26/2018 0000   K 5.7 (H)  12/25/2021 1831   CL 99 12/25/2021 1831   CO2 16 (L) 12/25/2021 1831   GLUCOSE 93 12/25/2021 1831   BUN 122 (H) 12/25/2021 1831   CREATININE 23.19 (H) 12/25/2021 1831   CALCIUM 9.3 12/25/2021 1831   GFRNONAA 2 (L) 12/25/2021 1831   GFRAA 5 (L) 12/16/2018 1350    COAGS: Lab Results  Component Value Date   INR 1.5 (H) 11/30/2018   INR 1.21 05/11/2018   INR 1.16 05/10/2018       ASSESSMENT/PLAN: This is a 46 y.o. male without dialysis access; right femoral TDC was accidentally removed  -Plan will be for replacement of right femoral vein TDC today in the operating room by Dr. Stanford Breed -Continue n.p.o. status -Consent ordered -Dr. Stanford Breed also involved in the evaluation and management plan of this patient   Dagoberto Ligas PA-C [facility] [phone]   VASCULAR STAFF ADDENDUM: I have independently interviewed and examined the patient. I agree with the above.  Plan femoral tunneled dialysis catheter today in the OR.  Yevonne Aline. Stanford Breed, MD [facility] Phone Number: [phone] 12/26/2021 9:28 AM

## 2021-12-27 ENCOUNTER — Encounter (HOSPITAL_COMMUNITY): Payer: Self-pay | Admitting: Vascular Surgery

## 2021-12-27 LAB — HEPATITIS B SURFACE ANTIBODY, QUANTITATIVE: Hep B S AB Quant (Post): 85.6 m[IU]/mL (ref >9.9)

## 2021-12-27 NOTE — Anesthesia Postprocedure Evaluation (Signed)
Anesthesia Post Note  Patient: [name]  Procedure(s) Performed: INSERTION OF TUNNELED  DIALYSIS CATHETER LEFT FEMORAL ARTERY (Left: Groin)     Patient location during evaluation: PACU Anesthesia Type: General Level of consciousness: awake and alert Pain management: pain level controlled Vital Signs Assessment: post-procedure vital signs reviewed and stable Respiratory status: spontaneous breathing, nonlabored ventilation, respiratory function stable and patient connected to nasal cannula oxygen Cardiovascular status: blood pressure returned to baseline and stable Postop Assessment: no apparent nausea or vomiting Anesthetic complications: no   No notable events documented.  Last Vitals:  Vitals:   12/26/21 2113 12/26/21 2136  BP: 117/77 116/74  Pulse: 83 89  Resp: 14 18  Temp: 36.9 C 37.3 C  SpO2: 99% 100%    Last Pain:  Vitals:   12/26/21 2136  TempSrc: Oral  PainSc:                  [name]

## 2021-12-27 NOTE — Progress Notes (Signed)
Late Note Entry:  Contacted [facility] SW this morning to advise clinic pt left AMA last evening and should resume care tomorrow.   Melven Sartorius Renal Navigator [phone]

## 2021-12-28 ENCOUNTER — Telehealth: Payer: Self-pay | Admitting: Nurse Practitioner

## 2021-12-28 DIAGNOSIS — Z992 Dependence on renal dialysis: Secondary | ICD-10-CM | POA: Diagnosis not present

## 2021-12-28 DIAGNOSIS — N186 End stage renal disease: Secondary | ICD-10-CM | POA: Diagnosis not present

## 2021-12-28 DIAGNOSIS — N2581 Secondary hyperparathyroidism of renal origin: Secondary | ICD-10-CM | POA: Diagnosis not present

## 2021-12-28 NOTE — Telephone Encounter (Signed)
Transition of care contact from inpatient facility  Date of discharge: 12/26/2021 Date of contact: 12/28/2021 Method: Phone Spoke to: Patient  Patient contacted to discuss transition of care from recent inpatient hospitalization. Patient was admitted to [facility] from  12/26/2021 with discharge diagnosis of Failed tunneled dialysis catheter. Talked to patient while he is at HD unit. TDC is working so far. Dr. Donzetta Matters has said this is last available HD access. Patient left hospital AMA prior to using [facility].   Medication changes were reviewed.  Patient will follow up with his/her outpatient HD unit on: 12/28/2021

## 2021-12-31 ENCOUNTER — Other Ambulatory Visit: Payer: Self-pay

## 2021-12-31 DIAGNOSIS — N186 End stage renal disease: Secondary | ICD-10-CM | POA: Diagnosis not present

## 2021-12-31 DIAGNOSIS — Z992 Dependence on renal dialysis: Secondary | ICD-10-CM | POA: Diagnosis not present

## 2021-12-31 DIAGNOSIS — N2581 Secondary hyperparathyroidism of renal origin: Secondary | ICD-10-CM | POA: Diagnosis not present

## 2021-12-31 NOTE — Discharge Summary (Signed)
Physician Discharge Summary  [name] CBJ:[id number] DOB: 28-Dec-1975 DOA: 12/25/2021  PCP: Haydee Salter, MD  Admit date: 12/25/2021 Discharge date: 12/31/2021  Time spent: 10 minutes  Recommendations for Outpatient Follow-up:  None  Discharge Diagnoses:  MAIN problem for hospitalization   Malfunctioning dialysis catheter  Please see below for itemized issues addressed in [place]- refer to other progress notes for clarity if needed  Discharge Condition: Guarded  Diet recommendation: Renal, diabetic  Filed Weights   12/26/21 0948 12/26/21 1609  Weight: 133.5 kg (!) 144.7 kg    History of present illness:  46 year old male home dwelling DM TY 2 Super morbid obesity BMI 148 kg Remote history of paroxysmal A-fib currently in sinus LVEF suggestive of HFpEF Prior left great toe ulceration secondary to DM TY 2 ESRD/HD 2008-transplant kidney DDKT which failed in 2014-vasculopath with limited access-Long history of vasculopathy with AV fistulas and AV grafts to extremity which have failed as well as central venous stenosis veins in neck Has been following both locally and at [facility] by vascular surgery Dr. Wenda Overland Recent admission 2/6 through 07/16/2021 and had replacement of femoral HD cath-he signed out AMA at the time   He was readmitted with displaced femoral catheter and has poor access Dr. Gwenlyn Saran performed left femoral vein tunneled dialysis catheter placement 28 cm on 7/20  He elected to leave on 12/27/2018 3 PM and signed [place] No medications were given no further instructions were given to the patient and he was not examined prior to discharge    Discharge Exam: Vitals:   12/26/21 2113 12/26/21 2136  BP: 117/77 116/74  Pulse: 83 89  Resp: 14 18  Temp: 98.5 F (36.9 C) 99.2 F (37.3 C)  SpO2: 99% 100%    Subj on day of d/c   Did not see  General Exam on discharge  No exam performed patient left AMA  Discharge  Instructions    Allergies as of 12/26/2021   No Known Allergies      Medication List     ASK your doctor about these medications    apixaban 5 MG Tabs tablet Commonly known as: ELIQUIS Take 5 mg by mouth 2 (two) times daily.   atorvastatin 40 MG tablet Commonly known as: LIPITOR Take 1 tablet (40 mg total) by mouth daily.   ferric citrate 1 GM 210 MG(Fe) tablet Commonly known as: AURYXIA Take 630 mg by mouth 3 (three) times daily with meals.       No Known Allergies    The results of significant diagnostics from this hospitalization (including imaging, microbiology, ancillary and laboratory) are listed below for reference.    Significant Diagnostic Studies: DG C-Arm 1-60 Min  Result Date: 12/26/2021 CLINICAL DATA:  Attempted right femoral dialysis catheter placement, ultimately with placement of a left femoral approach dialysis catheter. EXAM: DG C-ARM 1-60 MIN CONTRAST:  Not provided. FLUOROSCOPY: 8 minutes, 48 seconds (287 mGy) COMPARISON:  Right common femoral vein approach dialysis catheter placement-07/16/2021 FINDINGS: Multiple intraoperative images of the lower pelvis and abdomen are provided for review. Initial image demonstrates apparent selective cannulation of the right common femoral vein with venogram demonstrating long segment narrowing/subtotal occlusion of the right common and external iliac veins with collateral hypertrophied venous supply from the right to left pelvic venous system. Subsequent images demonstrate apparent selective cannulation of the left femoral venous system, ultimately with placement of a left femoral approach dialysis catheter with tip terminating within the central aspect of the  left common iliac vein/caudal aspect of the IVC. IMPRESSION: 1. Left femoral approach dialysis catheter placement. 2. Long segment narrowing/subtotal occlusion of the right common and external iliac veins with hypertrophied trans pelvic venous collaterals.  Electronically Signed   By: Sandi Mariscal M.D.   On: 12/26/2021 14:29   DG C-Arm 1-60 Min-No Report  Result Date: 12/26/2021 Fluoroscopy was utilized by the requesting physician.  No radiographic interpretation.    Microbiology: Recent Results (from the past 240 hour(s))  Surgical pcr screen     Status: Abnormal   Collection Time: 12/26/21  9:42 AM   Specimen: Nasal Mucosa; Nasal Swab  Result Value Ref Range Status   MRSA, PCR POSITIVE (A) NEGATIVE Final    Comment: RESULT CALLED TO, READ BACK BY AND VERIFIED WITH: RN S. BAE [phone] '@1338'$  FH    Staphylococcus aureus POSITIVE (A) NEGATIVE Final    Comment: (NOTE) The Xpert SA Assay (FDA approved for NASAL specimens in patients 69 years of age and older), is one component of a comprehensive surveillance program. It is not intended to diagnose infection nor to guide or monitor treatment. Performed at [facility], [place] [address] [place], [place] [address]      Labs: Basic Metabolic Panel: Recent Labs  Lab 12/25/21 1831 12/26/21 1115  NA 138 138  K 5.7* 5.9*  CL 99 97*  CO2 16* 16*  GLUCOSE 93 74  BUN 122* 130*  CREATININE 23.19* 24.58*  CALCIUM 9.3 8.9   Liver Function Tests: Recent Labs  Lab 12/25/21 1831 12/26/21 1115  AST 11* 7*  ALT 10 8  ALKPHOS 40 33*  BILITOT 0.6 0.8  PROT 7.6 7.4  ALBUMIN 3.8 3.5   No results for input(s): "LIPASE", "AMYLASE" in the last 168 hours. No results for input(s): "AMMONIA" in the last 168 hours. CBC: Recent Labs  Lab 12/25/21 1831  WBC 4.6  NEUTROABS 2.5  HGB 10.9*  HCT 35.3*  MCV 94.6  PLT 165   Cardiac Enzymes: No results for input(s): "CKTOTAL", "CKMB", "CKMBINDEX", "TROPONINI" in the last 168 hours. BNP: BNP (last 3 results) No results for input(s): "BNP" in the last 8760 hours.  ProBNP (last 3 results) No results for input(s): "PROBNP" in the last 8760 hours.  CBG: Recent Labs  Lab 12/26/21 1234  GLUCAP 83       Signed:  Nita Sells MD   [facility] 12/31/2021, 2:53 PM

## 2021-12-31 NOTE — Patient Outreach (Signed)
[facility]) Care Management  12/31/2021  [name] [facility] Jul 12, 1975 [id number]   Telephone call to patient for nurse call. Discussed [facility] services. Patient agreeable to nurse outreach.  Care Plan : RN CM Plan of Care  Updates made by Jon Billings, RN since 12/31/2021 12:00 AM     Problem: Chronic Disease Management and Care Coordination Needs Hyperlipidemia   Priority: High     Long-Range Goal: Development of Plan of Care for Management of Hyperlipidemia   Start Date: 12/31/2021  Expected End Date: 06/08/2022  Priority: High  Note:   Current Barriers:  Chronic Disease Management support and education needs related to HLD   RNCM Clinical Goal(s):  Patient will verbalize basic understanding of  HLD disease process and self health management plan as evidenced by total cholesterol less than 200  through collaboration with RN Care manager, provider, and care team.   Interventions: Education and support related to hyperlipidemia Inter-disciplinary care team collaboration (see longitudinal plan of care) Evaluation of current treatment plan related to  self management and patient's adherence to plan as established by provider   Hyperlipidemia Interventions:  (Status:  New goal.) Long Term Goal Medication review performed; medication list updated in electronic medical record.  Counseled on importance of regular laboratory monitoring as prescribed Reviewed importance of limiting foods high in cholesterol  Patient Goals/Self-Care Activities: Take all medications as prescribed Attend all scheduled provider appointments take all medications exactly as prescribed call doctor with any symptoms you believe are related to your medicine call doctor when you experience any new symptoms  Follow Up Plan:  Telephone follow up appointment with care management team member scheduled for:  October The patient has been provided with contact information for the care management  team and has been advised to call with any health related questions or concerns.      Plan: RN CM will provide ongoing education and support to patient through phone calls.   RN CM will send welcome packet with consent to patient.   RN CM will send initial barriers letter, assessment, and care plan to primary care physician.   RN CM will contact patient October and patient agrees to next contact and care plan.  Jone Baseman, RN, MSN [facility] Care Management Care Management Coordinator Direct Line [phone] Toll Free: [phone]  Fax: [phone]

## 2021-12-31 NOTE — Patient Instructions (Signed)
Patient Goals/Self-Care Activities: Take all medications as prescribed Attend all scheduled provider appointments take all medications exactly as prescribed call doctor with any symptoms you believe are related to your medicine call doctor when you experience any new symptoms

## 2021-12-31 NOTE — Patient Outreach (Signed)
[facility]) Care Management  12/31/2021  Bodhi Moradi [facility] [birth year] [id number]   Telephone call to patient for nurse call.  No answer.  HIPAA compliant voice message left.    Plan: RN CM will attempt again within 4 business days and send letter.  Jone Baseman, RN, MSN [facility] Care Management Care Management Coordinator Direct Line [phone] Toll Free: [phone]  Fax: [phone]

## 2022-01-02 DIAGNOSIS — Z992 Dependence on renal dialysis: Secondary | ICD-10-CM | POA: Diagnosis not present

## 2022-01-02 DIAGNOSIS — N2581 Secondary hyperparathyroidism of renal origin: Secondary | ICD-10-CM | POA: Diagnosis not present

## 2022-01-02 DIAGNOSIS — N186 End stage renal disease: Secondary | ICD-10-CM | POA: Diagnosis not present

## 2022-01-04 DIAGNOSIS — Z992 Dependence on renal dialysis: Secondary | ICD-10-CM | POA: Diagnosis not present

## 2022-01-04 DIAGNOSIS — N186 End stage renal disease: Secondary | ICD-10-CM | POA: Diagnosis not present

## 2022-01-04 DIAGNOSIS — N2581 Secondary hyperparathyroidism of renal origin: Secondary | ICD-10-CM | POA: Diagnosis not present

## 2022-01-06 ENCOUNTER — Encounter (HOSPITAL_COMMUNITY): Payer: Self-pay | Admitting: Vascular Surgery

## 2022-01-06 DIAGNOSIS — N041 Nephrotic syndrome with focal and segmental glomerular lesions: Secondary | ICD-10-CM | POA: Diagnosis not present

## 2022-01-06 DIAGNOSIS — Z992 Dependence on renal dialysis: Secondary | ICD-10-CM | POA: Diagnosis not present

## 2022-01-06 DIAGNOSIS — N186 End stage renal disease: Secondary | ICD-10-CM | POA: Diagnosis not present

## 2022-01-07 DIAGNOSIS — Z992 Dependence on renal dialysis: Secondary | ICD-10-CM | POA: Diagnosis not present

## 2022-01-07 DIAGNOSIS — N041 Nephrotic syndrome with focal and segmental glomerular lesions: Secondary | ICD-10-CM | POA: Diagnosis not present

## 2022-01-07 DIAGNOSIS — N186 End stage renal disease: Secondary | ICD-10-CM | POA: Diagnosis not present

## 2022-01-07 DIAGNOSIS — N2581 Secondary hyperparathyroidism of renal origin: Secondary | ICD-10-CM | POA: Diagnosis not present

## 2022-01-09 DIAGNOSIS — N186 End stage renal disease: Secondary | ICD-10-CM | POA: Diagnosis not present

## 2022-01-09 DIAGNOSIS — Z992 Dependence on renal dialysis: Secondary | ICD-10-CM | POA: Diagnosis not present

## 2022-01-09 DIAGNOSIS — N2581 Secondary hyperparathyroidism of renal origin: Secondary | ICD-10-CM | POA: Diagnosis not present

## 2022-01-11 DIAGNOSIS — N186 End stage renal disease: Secondary | ICD-10-CM | POA: Diagnosis not present

## 2022-01-11 DIAGNOSIS — Z992 Dependence on renal dialysis: Secondary | ICD-10-CM | POA: Diagnosis not present

## 2022-01-11 DIAGNOSIS — N2581 Secondary hyperparathyroidism of renal origin: Secondary | ICD-10-CM | POA: Diagnosis not present

## 2022-01-14 DIAGNOSIS — N186 End stage renal disease: Secondary | ICD-10-CM | POA: Diagnosis not present

## 2022-01-14 DIAGNOSIS — N2581 Secondary hyperparathyroidism of renal origin: Secondary | ICD-10-CM | POA: Diagnosis not present

## 2022-01-14 DIAGNOSIS — Z992 Dependence on renal dialysis: Secondary | ICD-10-CM | POA: Diagnosis not present

## 2022-01-16 DIAGNOSIS — N2581 Secondary hyperparathyroidism of renal origin: Secondary | ICD-10-CM | POA: Diagnosis not present

## 2022-01-16 DIAGNOSIS — Z992 Dependence on renal dialysis: Secondary | ICD-10-CM | POA: Diagnosis not present

## 2022-01-16 DIAGNOSIS — N186 End stage renal disease: Secondary | ICD-10-CM | POA: Diagnosis not present

## 2022-01-18 DIAGNOSIS — N186 End stage renal disease: Secondary | ICD-10-CM | POA: Diagnosis not present

## 2022-01-18 DIAGNOSIS — N2581 Secondary hyperparathyroidism of renal origin: Secondary | ICD-10-CM | POA: Diagnosis not present

## 2022-01-18 DIAGNOSIS — Z992 Dependence on renal dialysis: Secondary | ICD-10-CM | POA: Diagnosis not present

## 2022-01-21 DIAGNOSIS — N2581 Secondary hyperparathyroidism of renal origin: Secondary | ICD-10-CM | POA: Diagnosis not present

## 2022-01-21 DIAGNOSIS — Z992 Dependence on renal dialysis: Secondary | ICD-10-CM | POA: Diagnosis not present

## 2022-01-21 DIAGNOSIS — N186 End stage renal disease: Secondary | ICD-10-CM | POA: Diagnosis not present

## 2022-01-23 DIAGNOSIS — N186 End stage renal disease: Secondary | ICD-10-CM | POA: Diagnosis not present

## 2022-01-23 DIAGNOSIS — Z992 Dependence on renal dialysis: Secondary | ICD-10-CM | POA: Diagnosis not present

## 2022-01-23 DIAGNOSIS — N2581 Secondary hyperparathyroidism of renal origin: Secondary | ICD-10-CM | POA: Diagnosis not present

## 2022-01-25 DIAGNOSIS — N186 End stage renal disease: Secondary | ICD-10-CM | POA: Diagnosis not present

## 2022-01-25 DIAGNOSIS — Z992 Dependence on renal dialysis: Secondary | ICD-10-CM | POA: Diagnosis not present

## 2022-01-25 DIAGNOSIS — N2581 Secondary hyperparathyroidism of renal origin: Secondary | ICD-10-CM | POA: Diagnosis not present

## 2022-01-28 DIAGNOSIS — N186 End stage renal disease: Secondary | ICD-10-CM | POA: Diagnosis not present

## 2022-01-28 DIAGNOSIS — Z992 Dependence on renal dialysis: Secondary | ICD-10-CM | POA: Diagnosis not present

## 2022-01-28 DIAGNOSIS — N2581 Secondary hyperparathyroidism of renal origin: Secondary | ICD-10-CM | POA: Diagnosis not present

## 2022-01-30 DIAGNOSIS — Z992 Dependence on renal dialysis: Secondary | ICD-10-CM | POA: Diagnosis not present

## 2022-01-30 DIAGNOSIS — N186 End stage renal disease: Secondary | ICD-10-CM | POA: Diagnosis not present

## 2022-01-30 DIAGNOSIS — N2581 Secondary hyperparathyroidism of renal origin: Secondary | ICD-10-CM | POA: Diagnosis not present

## 2022-01-31 DIAGNOSIS — L03116 Cellulitis of left lower limb: Secondary | ICD-10-CM | POA: Diagnosis not present

## 2022-01-31 DIAGNOSIS — L0889 Other specified local infections of the skin and subcutaneous tissue: Secondary | ICD-10-CM | POA: Diagnosis not present

## 2022-02-01 DIAGNOSIS — Z992 Dependence on renal dialysis: Secondary | ICD-10-CM | POA: Diagnosis not present

## 2022-02-01 DIAGNOSIS — N2581 Secondary hyperparathyroidism of renal origin: Secondary | ICD-10-CM | POA: Diagnosis not present

## 2022-02-01 DIAGNOSIS — N186 End stage renal disease: Secondary | ICD-10-CM | POA: Diagnosis not present

## 2022-02-04 DIAGNOSIS — Z992 Dependence on renal dialysis: Secondary | ICD-10-CM | POA: Diagnosis not present

## 2022-02-04 DIAGNOSIS — N186 End stage renal disease: Secondary | ICD-10-CM | POA: Diagnosis not present

## 2022-02-04 DIAGNOSIS — N2581 Secondary hyperparathyroidism of renal origin: Secondary | ICD-10-CM | POA: Diagnosis not present

## 2022-02-06 ENCOUNTER — Other Ambulatory Visit: Payer: Self-pay

## 2022-02-06 DIAGNOSIS — N2581 Secondary hyperparathyroidism of renal origin: Secondary | ICD-10-CM | POA: Diagnosis not present

## 2022-02-06 DIAGNOSIS — Z992 Dependence on renal dialysis: Secondary | ICD-10-CM | POA: Diagnosis not present

## 2022-02-06 DIAGNOSIS — N186 End stage renal disease: Secondary | ICD-10-CM | POA: Diagnosis not present

## 2022-02-06 NOTE — Patient Outreach (Signed)
[facility]) Care Management  02/06/2022  Greg Cratty [facility] 03-01-1976 [id number]   RN CM closing case:  pt will be followed for care coordination by [place] Management.  Jone Baseman, RN, MSN [facility] Care Management Care Management Coordinator Direct Line [phone] Toll Free: [phone]  Fax: [phone]

## 2022-02-07 DIAGNOSIS — N186 End stage renal disease: Secondary | ICD-10-CM | POA: Diagnosis not present

## 2022-02-07 DIAGNOSIS — N041 Nephrotic syndrome with focal and segmental glomerular lesions: Secondary | ICD-10-CM | POA: Diagnosis not present

## 2022-02-07 DIAGNOSIS — Z992 Dependence on renal dialysis: Secondary | ICD-10-CM | POA: Diagnosis not present

## 2022-02-08 DIAGNOSIS — N2581 Secondary hyperparathyroidism of renal origin: Secondary | ICD-10-CM | POA: Diagnosis not present

## 2022-02-08 DIAGNOSIS — Z992 Dependence on renal dialysis: Secondary | ICD-10-CM | POA: Diagnosis not present

## 2022-02-08 DIAGNOSIS — N186 End stage renal disease: Secondary | ICD-10-CM | POA: Diagnosis not present

## 2022-02-11 DIAGNOSIS — Z992 Dependence on renal dialysis: Secondary | ICD-10-CM | POA: Diagnosis not present

## 2022-02-11 DIAGNOSIS — N2581 Secondary hyperparathyroidism of renal origin: Secondary | ICD-10-CM | POA: Diagnosis not present

## 2022-02-11 DIAGNOSIS — N186 End stage renal disease: Secondary | ICD-10-CM | POA: Diagnosis not present

## 2022-02-13 DIAGNOSIS — N186 End stage renal disease: Secondary | ICD-10-CM | POA: Diagnosis not present

## 2022-02-13 DIAGNOSIS — Z992 Dependence on renal dialysis: Secondary | ICD-10-CM | POA: Diagnosis not present

## 2022-02-13 DIAGNOSIS — N2581 Secondary hyperparathyroidism of renal origin: Secondary | ICD-10-CM | POA: Diagnosis not present

## 2022-02-15 DIAGNOSIS — Z992 Dependence on renal dialysis: Secondary | ICD-10-CM | POA: Diagnosis not present

## 2022-02-15 DIAGNOSIS — N186 End stage renal disease: Secondary | ICD-10-CM | POA: Diagnosis not present

## 2022-02-15 DIAGNOSIS — N2581 Secondary hyperparathyroidism of renal origin: Secondary | ICD-10-CM | POA: Diagnosis not present

## 2022-02-18 DIAGNOSIS — N2581 Secondary hyperparathyroidism of renal origin: Secondary | ICD-10-CM | POA: Diagnosis not present

## 2022-02-18 DIAGNOSIS — Z992 Dependence on renal dialysis: Secondary | ICD-10-CM | POA: Diagnosis not present

## 2022-02-18 DIAGNOSIS — N186 End stage renal disease: Secondary | ICD-10-CM | POA: Diagnosis not present

## 2022-02-19 ENCOUNTER — Ambulatory Visit (INDEPENDENT_AMBULATORY_CARE_PROVIDER_SITE_OTHER): Payer: Medicare HMO | Admitting: Vascular Surgery

## 2022-02-19 ENCOUNTER — Encounter: Payer: Self-pay | Admitting: Vascular Surgery

## 2022-02-19 VITALS — BP 84/65 | HR 85 | Temp 98.0°F | Resp 20 | Ht 74.0 in | Wt 296.0 lb

## 2022-02-19 DIAGNOSIS — N186 End stage renal disease: Secondary | ICD-10-CM

## 2022-02-19 NOTE — H&P (View-Only) (Signed)
Patient ID: [name], male   DOB: Dec 16, 1975, 46 y.o.   MRN: [id number]  Reason for Consult: Follow-up   Referred by Corliss Parish, MD  Subjective:     HPI:  [name] is a 46 y.o. male has a history of end-stage renal disease currently on dialysis via left femoral tunneled dialysis catheter which terminates in the infrarenal IVC.  Catheter continues to work.  He is on Eliquis for previous catheter that thrombosed multiple times.  He is now here to discuss further options including peritoneal dialysis.  He was previously on peritoneal dialysis 10 years ago catheter was removed from transplant was placed.  The transplant and other peritoneal dialysis catheter he does not have any other abdominal surgeries.  Currently on dialysis Tuesdays, Thursdays and Saturdays.  Past Medical History:  Diagnosis Date   Acute respiratory failure with hypoxia ([place]) 11/30/2018   Anemia    ESRD   ESRD on hemodialysis ([place]) 06/07/2011   TTS [place]. Started HD in 2006, got transplant in 2014 lasted until Dec 2019 then went back on HD.  Has L thigh AVG as of Jun 2020.  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    Hypertension    Morbid obesity ([place])    Paroxysmal atrial fibrillation ([place])    Sepsis ([place]) 11/30/2018   Sleep apnea    Type 2 diabetes mellitus with other diabetic kidney complication ([place]) 22/97/9892   Family History  Problem Relation Age of Onset   Diabetes Father    Stroke Father    Stroke Maternal Grandmother    Anesthesia problems Neg Hx    Past Surgical History:  Procedure Laterality Date   ARTERIOVENOUS GRAFT PLACEMENT  08/09/2010   Left Thigh Graft by Dr. [name]   AV FISTULA PLACEMENT Right 05/18/2018   Procedure: INSERTION OF ARTERIOVENOUS (AV) GORE-TEX GRAFT Left THIGH;  Surgeon: Waynetta Sandy, MD;  Location: [place];  Service: Vascular;  Laterality: Right;   AV FISTULA PLACEMENT Right 02/15/2021   Procedure: INSERTION OF  ARTERIOVENOUS (AV) GORE-TEX LOOP GRAFT RIGHT THIGH;  Surgeon: Waynetta Sandy, MD;  Location: [place];  Service: Vascular;  Laterality: Right;   CAPD REMOVAL  05/08/2011   Procedure: CONTINUOUS AMBULATORY PERITONEAL DIALYSIS  (CAPD) CATHETER REMOVAL;  Surgeon: [name], MD;  Location: [place];  Service: General;  Laterality: N/A;  Removal of CAPD catheter, Dr. requests to go after 100   INSERTION OF DIALYSIS CATHETER  10/05/2010   Right Femoral Cath insertion by Dr. [name].  Pt ahas had several caths inserted.   INSERTION OF DIALYSIS CATHETER Right 02/15/2021   Procedure: Attempted INSERTION OF RIGHT and Left Internal Jugular DIALYSIS CATHETER, Insertion of Left Femoral Vein Dialysis Catheter;  Surgeon: Waynetta Sandy, MD;  Location: [place];  Service: Vascular;  Laterality: Right;   INSERTION OF DIALYSIS CATHETER Right 04/26/2021   Procedure: INSERTION OF TUNNELED 55cm PALIDROME PRECISION CHRONIC DIALYSIS CATHETER;  Surgeon: Cherre Robins, MD;  Location: [place];  Service: Vascular;  Laterality: Right;   INSERTION OF DIALYSIS CATHETER Left 12/26/2021   Procedure: INSERTION OF TUNNELED  DIALYSIS CATHETER LEFT FEMORAL ARTERY;  Surgeon: Waynetta Sandy, MD;  Location: [place];  Service: Vascular;  Laterality: Left;   IR FLUORO GUIDE CV LINE RIGHT  05/11/2018   IR FLUORO GUIDE CV LINE RIGHT  07/03/2021   IR FLUORO GUIDE CV LINE RIGHT  07/16/2021   IR PTA ADDL CENTRAL DIALYSIS SEG  THRU DIALY CIRCUIT RIGHT Right 07/03/2021   IR US GUIDE VASC ACCESS RIGHT  05/11/2018   IR VENOCAVAGRAM IVC  07/16/2021   KIDNEY TRANSPLANT  2014   KNEE ARTHROSCOPY Left    THROMBECTOMY W/ EMBOLECTOMY Right 04/26/2021   Procedure: REMOVAL OF RIGHT THIGH ARTERIOVENOUS GORE-TEX GRAFT AND REPAIR OF RIGHT COMMON FEMORAL ARTERY;  Surgeon: Cherre Robins, MD;  Location: [place];  Service: Vascular;  Laterality: Right;   UPPER EXTREMITY ANGIOGRAPHY Bilateral 05/13/2018   Procedure: UPPER EXTREMITY  ANGIOGRAPHY - bilarteral;  Surgeon: Marty Heck, MD;  Location: [place] [facility];  Service: Cardiovascular;  Laterality: Bilateral;    Short Social History:  Social History   Tobacco Use   Smoking status: Former    Types: Cigarettes    Quit date: 11/08/2015    Years since quitting: 6.2   Smokeless tobacco: Never   Tobacco comments:    Smokes a cigarette about every 3-4 days.  Substance Use Topics   Alcohol use: Yes    Comment: Socially    No Known Allergies  Current Outpatient Medications  Medication Sig Dispense Refill   apixaban (ELIQUIS) 5 MG TABS tablet Take 5 mg by mouth 2 (two) times daily.     atorvastatin (LIPITOR) 40 MG tablet Take 1 tablet (40 mg total) by mouth daily. 90 tablet 3   B Complex-C-Zn-Folic Acid (DIALYVITE/ZINC) TABS      ferric citrate (AURYXIA) 1 GM 210 MG(Fe) tablet Take 630 mg by mouth 3 (three) times daily with meals.     No current facility-administered medications for this visit.    Review of Systems  Constitutional:  Constitutional negative. HENT: HENT negative.  Eyes: Eyes negative.  Respiratory: Respiratory negative.  Cardiovascular: Cardiovascular negative.  GI: Gastrointestinal negative.  Musculoskeletal: Musculoskeletal negative.  Skin: Skin negative.  Neurological: Neurological negative. Hematologic: Hematologic/lymphatic negative.  Psychiatric: Psychiatric negative.        Objective:  Objective   Vitals:   02/19/22 0925  BP: (!) 84/65  Pulse: 85  Resp: 20  Temp: 98 F (36.7 C)  SpO2: 99%  Weight: 296 lb (134.3 kg)  Height: '6\' 2"'$  (1.88 m)   Body mass index is 38 kg/m.  Physical Exam HENT:     Nose: Nose normal.  Eyes:     Pupils: Pupils are equal, round, and reactive to light.  Pulmonary:     Effort: Pulmonary effort is normal.  Abdominal:     General: Abdomen is flat.     Palpations: Abdomen is soft.  Musculoskeletal:     Comments: Wearing a boot on left foot  Neurological:     General: No focal  deficit present.     Mental Status: He is alert.     Data: No studies today     Assessment/Plan:     46 year old male currently the left femoral tunneled dialysis catheter that unfortunately terminates in his IVC and then the IVC is occluded above this and we will consider this at his last access option for hemodialysis other than lumbar access to be placed possibly by interventional radiology.  He is now here to discuss peritoneal dialysis.  He does have a previous catheter as well as renal transplant catheter was removed when the transplant was placed.  He does not have any other abdominal surgeries.  We have discussed proceeding with continuous ambulatory peritoneal dialysis catheter placement and also discussed with him the high risk nature of this including the risk for injury to surrounding structures in  his abdomen as well as the risk of malfunction of the catheter.  He demonstrates good understanding we will get him scheduled on a nondialysis day in the near future and he will need to hold Eliquis 72 hours prior to procedure     Waynetta Sandy MD [facility]

## 2022-02-19 NOTE — Progress Notes (Signed)
Patient ID: [name], male   DOB: 10-May-1976, 46 y.o.   MRN: [id number]  Reason for Consult: Follow-up   Referred by Corliss Parish, MD  Subjective:     HPI:  [name] is a 46 y.o. male has a history of end-stage renal disease currently on dialysis via left femoral tunneled dialysis catheter which terminates in the infrarenal IVC.  Catheter continues to work.  He is on Eliquis for previous catheter that thrombosed multiple times.  He is now here to discuss further options including peritoneal dialysis.  He was previously on peritoneal dialysis 10 years ago catheter was removed from transplant was placed.  The transplant and other peritoneal dialysis catheter he does not have any other abdominal surgeries.  Currently on dialysis Tuesdays, Thursdays and Saturdays.  Past Medical History:  Diagnosis Date   Acute respiratory failure with hypoxia ([place]) 11/30/2018   Anemia    ESRD   ESRD on hemodialysis ([place]) 06/07/2011   TTS [place]. Started HD in 2006, got transplant in 2014 lasted until Dec 2019 then went back on HD.  Has L thigh AVG as of Jun 2020.  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    Hypertension    Morbid obesity ([place])    Paroxysmal atrial fibrillation ([place])    Sepsis ([place]) 11/30/2018   Sleep apnea    Type 2 diabetes mellitus with other diabetic kidney complication ([place]) 51/88/4166   Family History  Problem Relation Age of Onset   Diabetes Father    Stroke Father    Stroke Maternal Grandmother    Anesthesia problems Neg Hx    Past Surgical History:  Procedure Laterality Date   ARTERIOVENOUS GRAFT PLACEMENT  08/09/2010   Left Thigh Graft by Dr. [name]   AV FISTULA PLACEMENT Right 05/18/2018   Procedure: INSERTION OF ARTERIOVENOUS (AV) GORE-TEX GRAFT Left THIGH;  Surgeon: Waynetta Sandy, MD;  Location: [place];  Service: Vascular;  Laterality: Right;   AV FISTULA PLACEMENT Right 02/15/2021   Procedure: INSERTION OF  ARTERIOVENOUS (AV) GORE-TEX LOOP GRAFT RIGHT THIGH;  Surgeon: Waynetta Sandy, MD;  Location: [place];  Service: Vascular;  Laterality: Right;   CAPD REMOVAL  05/08/2011   Procedure: CONTINUOUS AMBULATORY PERITONEAL DIALYSIS  (CAPD) CATHETER REMOVAL;  Surgeon: [name], MD;  Location: [place];  Service: General;  Laterality: N/A;  Removal of CAPD catheter, Dr. requests to go after 100   INSERTION OF DIALYSIS CATHETER  10/05/2010   Right Femoral Cath insertion by Dr. [name].  Pt ahas had several caths inserted.   INSERTION OF DIALYSIS CATHETER Right 02/15/2021   Procedure: Attempted INSERTION OF RIGHT and Left Internal Jugular DIALYSIS CATHETER, Insertion of Left Femoral Vein Dialysis Catheter;  Surgeon: Waynetta Sandy, MD;  Location: [place];  Service: Vascular;  Laterality: Right;   INSERTION OF DIALYSIS CATHETER Right 04/26/2021   Procedure: INSERTION OF TUNNELED 55cm PALIDROME PRECISION CHRONIC DIALYSIS CATHETER;  Surgeon: Cherre Robins, MD;  Location: [place];  Service: Vascular;  Laterality: Right;   INSERTION OF DIALYSIS CATHETER Left 12/26/2021   Procedure: INSERTION OF TUNNELED  DIALYSIS CATHETER LEFT FEMORAL ARTERY;  Surgeon: Waynetta Sandy, MD;  Location: [place];  Service: Vascular;  Laterality: Left;   IR FLUORO GUIDE CV LINE RIGHT  05/11/2018   IR FLUORO GUIDE CV LINE RIGHT  07/03/2021   IR FLUORO GUIDE CV LINE RIGHT  07/16/2021   IR PTA ADDL CENTRAL DIALYSIS SEG  THRU DIALY CIRCUIT RIGHT Right 07/03/2021   IR US GUIDE VASC ACCESS RIGHT  05/11/2018   IR VENOCAVAGRAM IVC  07/16/2021   KIDNEY TRANSPLANT  2014   KNEE ARTHROSCOPY Left    THROMBECTOMY W/ EMBOLECTOMY Right 04/26/2021   Procedure: REMOVAL OF RIGHT THIGH ARTERIOVENOUS GORE-TEX GRAFT AND REPAIR OF RIGHT COMMON FEMORAL ARTERY;  Surgeon: Cherre Robins, MD;  Location: [place];  Service: Vascular;  Laterality: Right;   UPPER EXTREMITY ANGIOGRAPHY Bilateral 05/13/2018   Procedure: UPPER EXTREMITY  ANGIOGRAPHY - bilarteral;  Surgeon: Marty Heck, MD;  Location: [place] [facility];  Service: Cardiovascular;  Laterality: Bilateral;    Short Social History:  Social History   Tobacco Use   Smoking status: Former    Types: Cigarettes    Quit date: 11/08/2015    Years since quitting: 6.2   Smokeless tobacco: Never   Tobacco comments:    Smokes a cigarette about every 3-4 days.  Substance Use Topics   Alcohol use: Yes    Comment: Socially    No Known Allergies  Current Outpatient Medications  Medication Sig Dispense Refill   apixaban (ELIQUIS) 5 MG TABS tablet Take 5 mg by mouth 2 (two) times daily.     atorvastatin (LIPITOR) 40 MG tablet Take 1 tablet (40 mg total) by mouth daily. 90 tablet 3   B Complex-C-Zn-Folic Acid (DIALYVITE/ZINC) TABS      ferric citrate (AURYXIA) 1 GM 210 MG(Fe) tablet Take 630 mg by mouth 3 (three) times daily with meals.     No current facility-administered medications for this visit.    Review of Systems  Constitutional:  Constitutional negative. HENT: HENT negative.  Eyes: Eyes negative.  Respiratory: Respiratory negative.  Cardiovascular: Cardiovascular negative.  GI: Gastrointestinal negative.  Musculoskeletal: Musculoskeletal negative.  Skin: Skin negative.  Neurological: Neurological negative. Hematologic: Hematologic/lymphatic negative.  Psychiatric: Psychiatric negative.        Objective:  Objective   Vitals:   02/19/22 0925  BP: (!) 84/65  Pulse: 85  Resp: 20  Temp: 98 F (36.7 C)  SpO2: 99%  Weight: 296 lb (134.3 kg)  Height: '6\' 2"'$  (1.88 m)   Body mass index is 38 kg/m.  Physical Exam HENT:     Nose: Nose normal.  Eyes:     Pupils: Pupils are equal, round, and reactive to light.  Pulmonary:     Effort: Pulmonary effort is normal.  Abdominal:     General: Abdomen is flat.     Palpations: Abdomen is soft.  Musculoskeletal:     Comments: Wearing a boot on left foot  Neurological:     General: No focal  deficit present.     Mental Status: He is alert.     Data: No studies today     Assessment/Plan:     46 year old male currently the left femoral tunneled dialysis catheter that unfortunately terminates in his IVC and then the IVC is occluded above this and we will consider this at his last access option for hemodialysis other than lumbar access to be placed possibly by interventional radiology.  He is now here to discuss peritoneal dialysis.  He does have a previous catheter as well as renal transplant catheter was removed when the transplant was placed.  He does not have any other abdominal surgeries.  We have discussed proceeding with continuous ambulatory peritoneal dialysis catheter placement and also discussed with him the high risk nature of this including the risk for injury to surrounding structures in  his abdomen as well as the risk of malfunction of the catheter.  He demonstrates good understanding we will get him scheduled on a nondialysis day in the near future and he will need to hold Eliquis 72 hours prior to procedure     Waynetta Sandy MD [facility]

## 2022-02-20 DIAGNOSIS — N2581 Secondary hyperparathyroidism of renal origin: Secondary | ICD-10-CM | POA: Diagnosis not present

## 2022-02-20 DIAGNOSIS — Z992 Dependence on renal dialysis: Secondary | ICD-10-CM | POA: Diagnosis not present

## 2022-02-20 DIAGNOSIS — N186 End stage renal disease: Secondary | ICD-10-CM | POA: Diagnosis not present

## 2022-02-22 DIAGNOSIS — N2581 Secondary hyperparathyroidism of renal origin: Secondary | ICD-10-CM | POA: Diagnosis not present

## 2022-02-22 DIAGNOSIS — N186 End stage renal disease: Secondary | ICD-10-CM | POA: Diagnosis not present

## 2022-02-22 DIAGNOSIS — Z992 Dependence on renal dialysis: Secondary | ICD-10-CM | POA: Diagnosis not present

## 2022-02-24 ENCOUNTER — Other Ambulatory Visit: Payer: Self-pay

## 2022-02-24 DIAGNOSIS — N186 End stage renal disease: Secondary | ICD-10-CM

## 2022-02-25 DIAGNOSIS — N186 End stage renal disease: Secondary | ICD-10-CM | POA: Diagnosis not present

## 2022-02-25 DIAGNOSIS — Z992 Dependence on renal dialysis: Secondary | ICD-10-CM | POA: Diagnosis not present

## 2022-02-25 DIAGNOSIS — N2581 Secondary hyperparathyroidism of renal origin: Secondary | ICD-10-CM | POA: Diagnosis not present

## 2022-02-27 DIAGNOSIS — Z992 Dependence on renal dialysis: Secondary | ICD-10-CM | POA: Diagnosis not present

## 2022-02-27 DIAGNOSIS — N2581 Secondary hyperparathyroidism of renal origin: Secondary | ICD-10-CM | POA: Diagnosis not present

## 2022-02-27 DIAGNOSIS — N186 End stage renal disease: Secondary | ICD-10-CM | POA: Diagnosis not present

## 2022-03-01 DIAGNOSIS — N186 End stage renal disease: Secondary | ICD-10-CM | POA: Diagnosis not present

## 2022-03-01 DIAGNOSIS — Z992 Dependence on renal dialysis: Secondary | ICD-10-CM | POA: Diagnosis not present

## 2022-03-01 DIAGNOSIS — N2581 Secondary hyperparathyroidism of renal origin: Secondary | ICD-10-CM | POA: Diagnosis not present

## 2022-03-03 DIAGNOSIS — S91302A Unspecified open wound, left foot, initial encounter: Secondary | ICD-10-CM | POA: Diagnosis not present

## 2022-03-03 DIAGNOSIS — L03116 Cellulitis of left lower limb: Secondary | ICD-10-CM | POA: Diagnosis not present

## 2022-03-04 DIAGNOSIS — Z992 Dependence on renal dialysis: Secondary | ICD-10-CM | POA: Diagnosis not present

## 2022-03-04 DIAGNOSIS — N186 End stage renal disease: Secondary | ICD-10-CM | POA: Diagnosis not present

## 2022-03-04 DIAGNOSIS — N2581 Secondary hyperparathyroidism of renal origin: Secondary | ICD-10-CM | POA: Diagnosis not present

## 2022-03-05 ENCOUNTER — Other Ambulatory Visit: Payer: Self-pay

## 2022-03-05 ENCOUNTER — Encounter (HOSPITAL_COMMUNITY): Payer: Self-pay | Admitting: Vascular Surgery

## 2022-03-05 ENCOUNTER — Other Ambulatory Visit (HOSPITAL_COMMUNITY): Payer: Self-pay | Admitting: Physician Assistant

## 2022-03-05 ENCOUNTER — Other Ambulatory Visit (HOSPITAL_COMMUNITY): Payer: Self-pay | Admitting: Nephrology

## 2022-03-05 ENCOUNTER — Telehealth: Payer: Self-pay

## 2022-03-05 DIAGNOSIS — T829XXA Unspecified complication of cardiac and vascular prosthetic device, implant and graft, initial encounter: Secondary | ICD-10-CM

## 2022-03-05 DIAGNOSIS — Z01818 Encounter for other preprocedural examination: Secondary | ICD-10-CM

## 2022-03-05 DIAGNOSIS — N186 End stage renal disease: Secondary | ICD-10-CM

## 2022-03-05 NOTE — Progress Notes (Signed)
PCP - Dr. Gena Fray with [facility] - Denies EKG - 12/26/21 Chest x-ray - 02/15/21 ECHO - 03/21/20 Cardiac Cath - Denies CPAP - hasnt used in 2 years has lost 60 lbs Fasting Blood Sugar:  No DM Blood Thinner Instructions: Eliquis holding today and tomorrow's dose Aspirin Instructions: Denies  Anesthesia review: No  -------------  SDW INSTRUCTIONS:  Your procedure is scheduled on 03/06/22 Thursday . Please report to [facility] Main Entrance "A" at 730 A.M., and check in at the Admitting office. Call this number if you have problems the morning of surgery: [phone]   Remember: Do not eat or drink after midnight the night before your surgery    Medications to take morning of surgery with a sip of water include: Lipitor, Doxycycline  As of today, STOP taking any Aspirin (unless otherwise instructed by your surgeon), Aleve, Naproxen, Ibuprofen, Motrin, Advil, Goody's, BC's, all herbal medications, fish oil, and all vitamins.    The Morning of Surgery Do not wear jewelry Do not wear lotions, colognes, or deodorant Do not bring valuables to the hospital. [facility] is not responsible for any belongings or valuables.  If you are a smoker, DO NOT Smoke 24 hours prior to surgery  If you wear a CPAP at night please bring your mask the morning of surgery   Remember that you must have someone to transport you home after your surgery, and remain with you for 24 hours if you are discharged the same day.  Please bring cases for contacts, glasses, hearing aids, dentures or bridgework because it cannot be worn into surgery.   Patients discharged the day of surgery will not be allowed to drive home.   Please shower the NIGHT BEFORE/MORNING OF SURGERY (use antibacterial soap like DIAL soap if possible). Wear comfortable clothes the morning of surgery. Oral Hygiene is also important to reduce your risk of infection.  Remember - BRUSH YOUR TEETH THE MORNING OF SURGERY WITH YOUR REGULAR  TOOTHPASTE  Patient denies shortness of breath, fever, cough and chest pain.

## 2022-03-05 NOTE — Telephone Encounter (Signed)
Received a call from [place] at [place] reporting that patient's [facility] is dislodged and inquires if he can be scheduled for an exchange today or tomorrow due to patient is scheduled to have HD on tomorrow. Informed Dr. Donzetta Matters of situation and agrees with plan for surgery on tomorrow. Instructions provided to Anderson Malta- pt to arrive at [facility] admissions at 0700, NPO after MN, hold remaining doses of Eliquis today and tomorrow prior to surgery. Preadmission will contact pt to review remaining medications and any additional instructions. She verbalized understanding.

## 2022-03-06 ENCOUNTER — Encounter (HOSPITAL_COMMUNITY): Payer: Self-pay | Admitting: Vascular Surgery

## 2022-03-06 ENCOUNTER — Other Ambulatory Visit: Payer: Self-pay | Admitting: Radiology

## 2022-03-06 ENCOUNTER — Other Ambulatory Visit: Payer: Self-pay

## 2022-03-06 ENCOUNTER — Ambulatory Visit (HOSPITAL_BASED_OUTPATIENT_CLINIC_OR_DEPARTMENT_OTHER): Payer: Medicare HMO | Admitting: Anesthesiology

## 2022-03-06 ENCOUNTER — Ambulatory Visit (HOSPITAL_COMMUNITY)
Admission: RE | Admit: 2022-03-06 | Discharge: 2022-03-06 | Disposition: A | Discharge status: Home / Self Care | Payer: Medicare HMO | Source: Ambulatory Visit | Attending: Vascular Surgery | Admitting: Vascular Surgery

## 2022-03-06 ENCOUNTER — Ambulatory Visit (HOSPITAL_COMMUNITY): Payer: Medicare HMO | Admitting: Anesthesiology

## 2022-03-06 ENCOUNTER — Encounter (HOSPITAL_COMMUNITY)
Admission: RE | Disposition: A | Discharge status: Home / Self Care | Payer: Self-pay | Source: Ambulatory Visit | Attending: Vascular Surgery

## 2022-03-06 DIAGNOSIS — Z940 Kidney transplant status: Secondary | ICD-10-CM | POA: Diagnosis not present

## 2022-03-06 DIAGNOSIS — Z992 Dependence on renal dialysis: Secondary | ICD-10-CM

## 2022-03-06 DIAGNOSIS — Y841 Kidney dialysis as the cause of abnormal reaction of the patient, or of later complication, without mention of misadventure at the time of the procedure: Secondary | ICD-10-CM | POA: Diagnosis not present

## 2022-03-06 DIAGNOSIS — Z452 Encounter for adjustment and management of vascular access device: Secondary | ICD-10-CM | POA: Diagnosis not present

## 2022-03-06 DIAGNOSIS — T8249XA Other complication of vascular dialysis catheter, initial encounter: Secondary | ICD-10-CM | POA: Diagnosis not present

## 2022-03-06 DIAGNOSIS — I120 Hypertensive chronic kidney disease with stage 5 chronic kidney disease or end stage renal disease: Secondary | ICD-10-CM

## 2022-03-06 DIAGNOSIS — T829XXA Unspecified complication of cardiac and vascular prosthetic device, implant and graft, initial encounter: Secondary | ICD-10-CM

## 2022-03-06 DIAGNOSIS — N185 Chronic kidney disease, stage 5: Secondary | ICD-10-CM | POA: Diagnosis not present

## 2022-03-06 DIAGNOSIS — Z7901 Long term (current) use of anticoagulants: Secondary | ICD-10-CM | POA: Diagnosis not present

## 2022-03-06 DIAGNOSIS — N2581 Secondary hyperparathyroidism of renal origin: Secondary | ICD-10-CM | POA: Diagnosis not present

## 2022-03-06 DIAGNOSIS — N186 End stage renal disease: Secondary | ICD-10-CM

## 2022-03-06 DIAGNOSIS — Z87891 Personal history of nicotine dependence: Secondary | ICD-10-CM | POA: Diagnosis not present

## 2022-03-06 HISTORY — PX: INSERTION OF DIALYSIS CATHETER: SHX1324

## 2022-03-06 LAB — POCT I-STAT, CHEM 8
BUN: 39 mg/dL — ABNORMAL HIGH (ref 6–20)
Calcium, Ion: 0.97 mmol/L — ABNORMAL LOW (ref 1.15–1.40)
Chloride: 98 mmol/L (ref 98–111)
Creatinine, Ser: 15.3 mg/dL — ABNORMAL HIGH (ref 0.61–1.24)
Glucose, Bld: 95 mg/dL (ref 70–99)
HCT: 43 % (ref 39.0–52.0)
Hemoglobin: 14.6 g/dL (ref 13.0–17.0)
Potassium: 4.5 mmol/L (ref 3.5–5.1)
Sodium: 134 mmol/L — ABNORMAL LOW (ref 135–145)
TCO2: 26 mmol/L (ref 22–32)

## 2022-03-06 SURGERY — INSERTION OF DIALYSIS CATHETER
Anesthesia: General | Site: Groin | Laterality: Left

## 2022-03-06 MED ORDER — DEXAMETHASONE SODIUM PHOSPHATE 10 MG/ML IJ SOLN
INTRAMUSCULAR | Status: AC
  Filled 2022-03-06: qty 1

## 2022-03-06 MED ORDER — PROPOFOL 10 MG/ML IV BOLUS
INTRAVENOUS | Status: AC
  Filled 2022-03-06: qty 20

## 2022-03-06 MED ORDER — HEPARIN SODIUM (PORCINE) 1000 UNIT/ML IJ SOLN
INTRAMUSCULAR | Status: DC | PRN
  Administered 2022-03-06: 4200 [IU] via INTRAVENOUS

## 2022-03-06 MED ORDER — LIDOCAINE 2% (20 MG/ML) 5 ML SYRINGE
INTRAMUSCULAR | Status: DC | PRN
  Administered 2022-03-06: 40 mg via INTRAVENOUS

## 2022-03-06 MED ORDER — OXYCODONE HCL 5 MG/5ML PO SOLN: 5.0000 mg | Freq: Once | ORAL | Status: AC | PRN

## 2022-03-06 MED ORDER — ONDANSETRON HCL 4 MG/2ML IJ SOLN
INTRAMUSCULAR | Status: AC
  Filled 2022-03-06: qty 2

## 2022-03-06 MED ORDER — CEFAZOLIN SODIUM-DEXTROSE 2-4 GM/100ML-% IV SOLN
INTRAVENOUS | Status: AC
  Filled 2022-03-06: qty 100

## 2022-03-06 MED ORDER — MIDAZOLAM HCL 2 MG/2ML IJ SOLN
INTRAMUSCULAR | Status: DC | PRN
  Administered 2022-03-06 (×2): 1 mg via INTRAVENOUS

## 2022-03-06 MED ORDER — ROCURONIUM BROMIDE 10 MG/ML (PF) SYRINGE
PREFILLED_SYRINGE | INTRAVENOUS | Status: DC | PRN
  Administered 2022-03-06: 30 mg via INTRAVENOUS

## 2022-03-06 MED ORDER — SODIUM CHLORIDE 0.9 % IV SOLN: INTRAVENOUS | Status: DC

## 2022-03-06 MED ORDER — ROCURONIUM BROMIDE 10 MG/ML (PF) SYRINGE
PREFILLED_SYRINGE | INTRAVENOUS | Status: AC
  Filled 2022-03-06: qty 10

## 2022-03-06 MED ORDER — FENTANYL CITRATE (PF) 100 MCG/2ML IJ SOLN
25.0000 ug | INTRAMUSCULAR | Status: DC | PRN
  Administered 2022-03-06: 25 ug via INTRAVENOUS

## 2022-03-06 MED ORDER — ALBUMIN HUMAN 5 % IV SOLN
INTRAVENOUS | Status: AC
  Filled 2022-03-06: qty 250

## 2022-03-06 MED ORDER — FENTANYL CITRATE (PF) 250 MCG/5ML IJ SOLN
INTRAMUSCULAR | Status: DC | PRN
  Administered 2022-03-06 (×2): 25 ug via INTRAVENOUS

## 2022-03-06 MED ORDER — CHLORHEXIDINE GLUCONATE 0.12 % MT SOLN
15.0000 mL | OROMUCOSAL | Status: AC
  Filled 2022-03-06: qty 15

## 2022-03-06 MED ORDER — PHENYLEPHRINE 80 MCG/ML (10ML) SYRINGE FOR IV PUSH (FOR BLOOD PRESSURE SUPPORT)
PREFILLED_SYRINGE | INTRAVENOUS | Status: DC | PRN
  Administered 2022-03-06 (×2): 240 ug via INTRAVENOUS
  Administered 2022-03-06 (×4): 160 ug via INTRAVENOUS

## 2022-03-06 MED ORDER — OXYCODONE HCL 5 MG PO TABS
5.0000 mg | ORAL_TABLET | Freq: Once | ORAL | Status: AC | PRN
  Administered 2022-03-06: 5 mg via ORAL

## 2022-03-06 MED ORDER — FENTANYL CITRATE (PF) 250 MCG/5ML IJ SOLN
INTRAMUSCULAR | Status: AC
  Filled 2022-03-06: qty 5

## 2022-03-06 MED ORDER — CEFAZOLIN IN SODIUM CHLORIDE 3-0.9 GM/100ML-% IV SOLN
3.0000 g | INTRAVENOUS | Status: AC
  Administered 2022-03-06: 2 g via INTRAVENOUS

## 2022-03-06 MED ORDER — PROPOFOL 10 MG/ML IV BOLUS
INTRAVENOUS | Status: DC | PRN
  Administered 2022-03-06: 150 mg via INTRAVENOUS
  Administered 2022-03-06: 50 mg via INTRAVENOUS

## 2022-03-06 MED ORDER — DEXAMETHASONE SODIUM PHOSPHATE 10 MG/ML IJ SOLN
INTRAMUSCULAR | Status: DC | PRN
  Administered 2022-03-06: 10 mg via INTRAVENOUS

## 2022-03-06 MED ORDER — IODIXANOL 320 MG/ML IV SOLN
INTRAVENOUS | Status: DC | PRN
  Administered 2022-03-06: 20 mL

## 2022-03-06 MED ORDER — ONDANSETRON HCL 4 MG/2ML IJ SOLN
INTRAMUSCULAR | Status: DC | PRN
  Administered 2022-03-06: 4 mg via INTRAVENOUS

## 2022-03-06 MED ORDER — SUCCINYLCHOLINE CHLORIDE 200 MG/10ML IV SOSY
PREFILLED_SYRINGE | INTRAVENOUS | Status: DC | PRN
  Administered 2022-03-06: 140 mg via INTRAVENOUS

## 2022-03-06 MED ORDER — CHLORHEXIDINE GLUCONATE 0.12 % MT SOLN
OROMUCOSAL | Status: AC
  Administered 2022-03-06: 15 mL via OROMUCOSAL
  Filled 2022-03-06: qty 15

## 2022-03-06 MED ORDER — PHENYLEPHRINE 80 MCG/ML (10ML) SYRINGE FOR IV PUSH (FOR BLOOD PRESSURE SUPPORT)
PREFILLED_SYRINGE | INTRAVENOUS | Status: AC
  Filled 2022-03-06: qty 20

## 2022-03-06 MED ORDER — PHENYLEPHRINE HCL-NACL 20-0.9 MG/250ML-% IV SOLN
INTRAVENOUS | Status: DC | PRN
  Administered 2022-03-06: 100 ug/min via INTRAVENOUS
  Administered 2022-03-06: 75 ug/min via INTRAVENOUS
  Administered 2022-03-06: 50 ug/min via INTRAVENOUS

## 2022-03-06 MED ORDER — SUGAMMADEX SODIUM 500 MG/5ML IV SOLN
INTRAVENOUS | Status: DC | PRN
  Administered 2022-03-06: 300 mg via INTRAVENOUS

## 2022-03-06 MED ORDER — 0.9 % SODIUM CHLORIDE (POUR BTL) OPTIME
TOPICAL | Status: DC | PRN
  Administered 2022-03-06: 1000 mL

## 2022-03-06 MED ORDER — MIDAZOLAM HCL 2 MG/2ML IJ SOLN
INTRAMUSCULAR | Status: AC
  Filled 2022-03-06: qty 2

## 2022-03-06 MED ORDER — SUGAMMADEX SODIUM 500 MG/5ML IV SOLN
INTRAVENOUS | Status: AC
  Filled 2022-03-06: qty 5

## 2022-03-06 MED ORDER — ALBUMIN HUMAN 5 % IV SOLN
12.5000 g | Freq: Once | INTRAVENOUS | Status: AC
  Administered 2022-03-06: 12.5 g via INTRAVENOUS

## 2022-03-06 MED ORDER — CHLORHEXIDINE GLUCONATE 4 % EX LIQD: 60.0000 mL | Freq: Once | CUTANEOUS | Status: DC

## 2022-03-06 MED ORDER — MIDAZOLAM HCL 2 MG/2ML IJ SOLN: 0.5000 mg | Freq: Once | INTRAMUSCULAR | Status: DC | PRN

## 2022-03-06 MED ORDER — FENTANYL CITRATE (PF) 100 MCG/2ML IJ SOLN
INTRAMUSCULAR | Status: AC
  Filled 2022-03-06: qty 2

## 2022-03-06 MED ORDER — HEPARIN SODIUM (PORCINE) 1000 UNIT/ML IJ SOLN
INTRAMUSCULAR | Status: AC
  Filled 2022-03-06: qty 10

## 2022-03-06 MED ORDER — OXYCODONE HCL 5 MG PO TABS
ORAL_TABLET | ORAL | Status: AC
  Filled 2022-03-06: qty 1

## 2022-03-06 MED ORDER — PROMETHAZINE HCL 25 MG/ML IJ SOLN: 6.2500 mg | INTRAMUSCULAR | Status: DC | PRN

## 2022-03-06 MED ORDER — HEPARIN 6000 UNIT IRRIGATION SOLUTION
Status: DC | PRN
  Administered 2022-03-06: 1

## 2022-03-06 MED ORDER — SUCCINYLCHOLINE CHLORIDE 200 MG/10ML IV SOSY
PREFILLED_SYRINGE | INTRAVENOUS | Status: AC
  Filled 2022-03-06: qty 10

## 2022-03-06 MED ORDER — HEPARIN 6000 UNIT IRRIGATION SOLUTION
Status: AC
  Filled 2022-03-06: qty 500

## 2022-03-06 MED ORDER — LIDOCAINE 2% (20 MG/ML) 5 ML SYRINGE
INTRAMUSCULAR | Status: AC
  Filled 2022-03-06: qty 5

## 2022-03-06 SURGICAL SUPPLY — 48 items
ADH SKN CLS APL DERMABOND .7 (GAUZE/BANDAGES/DRESSINGS) ×2
BAG COUNTER SPONGE SURGICOUNT (BAG) ×3 IMPLANT
BAG DECANTER FOR FLEXI CONT (MISCELLANEOUS) ×3 IMPLANT
BAG SPNG CNTER NS LX DISP (BAG) ×2
BIOPATCH RED 1 DISK 7.0 (GAUZE/BANDAGES/DRESSINGS) ×3 IMPLANT
CATH ANGIO BERNSTEIN 5X40X.035 (CATHETERS) ×1 IMPLANT
CATH PALINDROME-P 19CM W/VT (CATHETERS) IMPLANT
CATH PALINDROME-P 23CM W/VT (CATHETERS) IMPLANT
CATH PALINDROME-P 28CM W/VT (CATHETERS) ×1 IMPLANT
CLIP LIGATING EXTRA MED SLVR (CLIP) ×3 IMPLANT
CLIP LIGATING EXTRA SM BLUE (MISCELLANEOUS) ×3 IMPLANT
COVER PROBE W GEL 5X96 (DRAPES) ×3 IMPLANT
COVER SURGICAL LIGHT HANDLE (MISCELLANEOUS) ×3 IMPLANT
DERMABOND ADVANCED .7 DNX12 (GAUZE/BANDAGES/DRESSINGS) ×3 IMPLANT
DRAPE C-ARM 42X72 X-RAY (DRAPES) ×3 IMPLANT
DRAPE CHEST BREAST 15X10 FENES (DRAPES) ×3 IMPLANT
GAUZE 4X4 16PLY ~~LOC~~+RFID DBL (SPONGE) ×3 IMPLANT
GAUZE SPONGE 2X2 8PLY STRL LF (GAUZE/BANDAGES/DRESSINGS) IMPLANT
GAUZE SPONGE 4X4 12PLY STRL (GAUZE/BANDAGES/DRESSINGS) ×1 IMPLANT
GLOVE BIO SURGEON STRL SZ7.5 (GLOVE) ×3 IMPLANT
GOWN STRL REUS W/ TWL LRG LVL3 (GOWN DISPOSABLE) ×3 IMPLANT
GOWN STRL REUS W/ TWL XL LVL3 (GOWN DISPOSABLE) ×3 IMPLANT
GOWN STRL REUS W/TWL LRG LVL3 (GOWN DISPOSABLE) ×2
GOWN STRL REUS W/TWL XL LVL3 (GOWN DISPOSABLE) ×2
KIT BASIN OR (CUSTOM PROCEDURE TRAY) ×3 IMPLANT
KIT PALINDROME-P 55CM (CATHETERS) IMPLANT
KIT TURNOVER KIT B (KITS) ×3 IMPLANT
NDL 18GX1X1/2 (RX/OR ONLY) (NEEDLE) ×2 IMPLANT
NDL HYPO 25GX1X1/2 BEV (NEEDLE) ×2 IMPLANT
NEEDLE 18GX1X1/2 (RX/OR ONLY) (NEEDLE) ×4 IMPLANT
NEEDLE HYPO 25GX1X1/2 BEV (NEEDLE) ×2 IMPLANT
NS IRRIG 1000ML POUR BTL (IV SOLUTION) ×3 IMPLANT
PACK BASIC III (CUSTOM PROCEDURE TRAY) ×2
PACK SRG BSC III STRL LF ECLPS (CUSTOM PROCEDURE TRAY) ×3 IMPLANT
PAD ARMBOARD 7.5X6 YLW CONV (MISCELLANEOUS) ×6 IMPLANT
SET MICROPUNCTURE 5F STIFF (MISCELLANEOUS) ×1 IMPLANT
SHEATH PINNACLE 5F 10CM (SHEATH) ×1 IMPLANT
SOAP 2 % CHG 4 OZ (WOUND CARE) ×3 IMPLANT
SUT ETHILON 3 0 PS 1 (SUTURE) ×3 IMPLANT
SUT MNCRL AB 4-0 PS2 18 (SUTURE) ×3 IMPLANT
SYR 10ML LL (SYRINGE) ×3 IMPLANT
SYR 20ML LL LF (SYRINGE) ×6 IMPLANT
SYR 5ML LL (SYRINGE) ×4 IMPLANT
SYR CONTROL 10ML LL (SYRINGE) ×3 IMPLANT
TOWEL GREEN STERILE (TOWEL DISPOSABLE) ×3 IMPLANT
WATER STERILE IRR 1000ML POUR (IV SOLUTION) ×3 IMPLANT
WIRE AMPLATZ SS-J .035X180CM (WIRE) ×1 IMPLANT
WIRE BENTSON .035X145CM (WIRE) ×1 IMPLANT

## 2022-03-06 NOTE — Op Note (Signed)
    Patient name: [name] MRN: [id number] DOB: 26-Jan-1976 Sex: male  03/06/2022 Pre-operative Diagnosis: End-stage renal disease, need for dialysis access Post-operative diagnosis:  Same Surgeon:  Eda Paschal. Donzetta Matters, MD Procedure Performed: 1.  Ultrasound-guided cannulation of left common femoral vein 2.  IVC venogram 3.  Placement of left common femoral 28 cm tunneled dialysis catheter with fluoroscopic guidance   Indications: 46 year old male with end-stage renal disease with very difficult access was on dialysis via left femoral catheter that terminated and an occluded IVC with collateralization via the left renal vein.  Unfortunately the catheter was accidentally removed and he is now indicated for new catheter placement.  Of note he is planned for peritoneal dialysis access in the near future.  Findings: IVC again was noted to be occluded with collateralization mostly via the left renal vein.  The catheter was placed into the infrarenal IVC and at completion it did flush and withdraw easily.   Procedure:  The patient was identified in the holding area and taken to the operating room where is placed supine on the operative when general anesthesia was induced.  He was sterilely prepped draped in the bilateral groins in usual fashion, antibiotics were administered and a timeout was called.  Ultrasound was used to identify what was a very deep common femoral vein on the left it was compressible although there was significant scar tissue this was cannulated with a micropuncture needle followed by wire and the sheath under fluoroscopic guidance.  I then placed a Bentson wire followed by 5 [place] sheath and then a Kumpe catheter and performed IVC venogram with the above findings.  We then placed an Amplatz wire.  I tunneled a 28 cm catheter from a counterincision.  I then serially dilated the Amplatz wire tract and placed the introducer sheath and the wire was removed and the catheter was placed  into the terminal IVC.  I did perform venogram through the catheter which confirmed it was in good location.  It did flush and withdrawal blood easily.  It was affixed to the skin in 2 places and the groin incision was closed with 4 Monocryl and Dermabond was placed at both sites.  Sterile dressing was then applied.  Patient was awakened from anesthesia having tolerated procedure well without immediate complication.  All counts were correct at completion.  EBL: 20 cc  Contrast: 20 cc   Fritz Cauthon C. Donzetta Matters, MD [facility] Office: [phone] Pager: [phone]

## 2022-03-06 NOTE — Transfer of Care (Signed)
Immediate Anesthesia Transfer of Care Note  Patient: [name]  Procedure(s) Performed: INSERTION OF DIALYSIS CATHETER (Left: Groin)  Patient Location: PACU  Anesthesia Type:General  Level of Consciousness: awake, alert  and oriented  Airway & Oxygen Therapy: Patient Spontanous Breathing  Post-op Assessment: Report given to RN, Post -op Vital signs reviewed and stable and Patient moving all extremities X 4  Post vital signs: Reviewed and stable  Last Vitals:  Vitals Value Taken Time  BP 90/58 03/06/22 1030  Temp 36.6 C 03/06/22 1030  Pulse 80 03/06/22 1035  Resp 15 03/06/22 1035  SpO2 100 % 03/06/22 1035  Vitals shown include unvalidated device data.  Last Pain:  Vitals:   03/06/22 0813  TempSrc:   PainSc: 0-No pain      Patients Stated Pain Goal: 0 (58/34/62 1948)  Complications: No notable events documented.

## 2022-03-06 NOTE — Anesthesia Procedure Notes (Signed)
Procedure Name: Intubation Date/Time: 03/06/2022 9:48 AM  Performed by: Harden Mo, CRNAPre-anesthesia Checklist: Patient identified, Emergency Drugs available, Suction available and Patient being monitored Patient Re-evaluated:Patient Re-evaluated prior to induction Oxygen Delivery Method: Circle System Utilized Preoxygenation: Pre-oxygenation with 100% oxygen Induction Type: IV induction Ventilation: Mask ventilation without difficulty Laryngoscope Size: Miller and 2 Grade View: Grade I Tube type: Oral Tube size: 7.5 mm Number of attempts: 1 Airway Equipment and Method: Stylet and Oral airway Placement Confirmation: ETT inserted through vocal cords under direct vision, positive ETCO2 and breath sounds checked- equal and bilateral Secured at: 24 cm Tube secured with: Tape Dental Injury: Teeth and Oropharynx as per pre-operative assessment

## 2022-03-06 NOTE — Anesthesia Postprocedure Evaluation (Signed)
Anesthesia Post Note  Patient: [name]  Procedure(s) Performed: INSERTION OF DIALYSIS CATHETER (Left: Groin)     Patient location during evaluation: PACU Anesthesia Type: General Level of consciousness: awake and alert, patient cooperative and oriented Pain management: pain level controlled Vital Signs Assessment: post-procedure vital signs reviewed and stable Respiratory status: spontaneous breathing, nonlabored ventilation and respiratory function stable Cardiovascular status: blood pressure returned to baseline and stable Postop Assessment: no apparent nausea or vomiting Anesthetic complications: no   No notable events documented.  Last Vitals:  Vitals:   03/06/22 1120 03/06/22 1130  BP: 92/67 103/78  Pulse: 84 74  Resp: 18 12  Temp:  36.6 C  SpO2: 99% 100%    Last Pain:  Vitals:   03/06/22 1130  TempSrc:   PainSc: 3                  Khris Jansson,E. Leoni Goodness

## 2022-03-06 NOTE — Interval H&P Note (Signed)
History and Physical Interval Note:  03/06/2022 8:59 AM  [name]  has presented today for surgery, with the diagnosis of Complication associated with dialysis catheter.  The various methods of treatment have been discussed with the patient and family. After consideration of risks, benefits and other options for treatment, the patient has consented to  Procedure(s): EXCHANGE OF A DIALYSIS CATHETER (N/A) as a surgical intervention.  The patient's history has been reviewed, patient examined, no change in status, stable for surgery.  I have reviewed the patient's chart and labs.  Questions were answered to the patient's satisfaction.     Servando Snare

## 2022-03-06 NOTE — Anesthesia Preprocedure Evaluation (Addendum)
Anesthesia Evaluation  Patient identified by MRN, date of birth, ID band Patient awake    Reviewed: Allergy & Precautions, NPO status , Patient's Chart, lab work & pertinent test results  History of Anesthesia Complications Negative for: history of anesthetic complications  Airway Mallampati: II  TM Distance: >3 FB Neck ROM: Full    Dental  (+) Dental Advisory Given   Pulmonary sleep apnea (does not use CPAP) , former smoker,    breath sounds clear to auscultation       Cardiovascular hypertension (no meds presently), + dysrhythmias Atrial Fibrillation  Rhythm:Regular Rate:Normal  '21 stress ECHO: Baseline regional wall motion abnormalities were not present. There were no stress-induced wall motion abnormalities. This is a  negative stress echocardiogram for ischemia.   '20 ECHO: EF 55-60%, normal LVF, RV has severely reduced systolic function. The cavity was moderately enlarged, no significant valvular abnormalities   Neuro/Psych negative neurological ROS     GI/Hepatic negative GI ROS, (+)     substance abuse  marijuana use,   Endo/Other  Morbid obesityBMI 37.9  Renal/GU ESRF and DialysisRenal disease (TuThSa)K+ 4.5     Musculoskeletal   Abdominal (+) + obese,   Peds  Hematology eliquis   Anesthesia Other Findings   Reproductive/Obstetrics                            Anesthesia Physical Anesthesia Plan  ASA: 3  Anesthesia Plan: General   Post-op Pain Management: Tylenol PO (pre-op)*   Induction: Intravenous  PONV Risk Score and Plan: 2 and Ondansetron and Dexamethasone  Airway Management Planned: LMA  Additional Equipment: None  Intra-op Plan:   Post-operative Plan:   Informed Consent: I have reviewed the patients History and Physical, chart, labs and discussed the procedure including the risks, benefits and alternatives for the proposed anesthesia with the patient or  authorized representative who has indicated his/her understanding and acceptance.     Dental advisory given  Plan Discussed with: CRNA and Surgeon  Anesthesia Plan Comments:        Anesthesia Quick Evaluation

## 2022-03-07 ENCOUNTER — Ambulatory Visit (HOSPITAL_COMMUNITY): Admission: RE | Admit: 2022-03-07 | Payer: Medicare HMO | Source: Ambulatory Visit

## 2022-03-07 ENCOUNTER — Encounter (HOSPITAL_COMMUNITY): Payer: Self-pay | Admitting: Vascular Surgery

## 2022-03-07 ENCOUNTER — Encounter (HOSPITAL_COMMUNITY): Payer: Self-pay

## 2022-03-08 DIAGNOSIS — N186 End stage renal disease: Secondary | ICD-10-CM | POA: Diagnosis not present

## 2022-03-08 DIAGNOSIS — N2581 Secondary hyperparathyroidism of renal origin: Secondary | ICD-10-CM | POA: Diagnosis not present

## 2022-03-08 DIAGNOSIS — Z992 Dependence on renal dialysis: Secondary | ICD-10-CM | POA: Diagnosis not present

## 2022-03-11 DIAGNOSIS — N186 End stage renal disease: Secondary | ICD-10-CM | POA: Diagnosis not present

## 2022-03-11 DIAGNOSIS — N2581 Secondary hyperparathyroidism of renal origin: Secondary | ICD-10-CM | POA: Diagnosis not present

## 2022-03-11 DIAGNOSIS — Z992 Dependence on renal dialysis: Secondary | ICD-10-CM | POA: Diagnosis not present

## 2022-03-13 DIAGNOSIS — Z992 Dependence on renal dialysis: Secondary | ICD-10-CM | POA: Diagnosis not present

## 2022-03-13 DIAGNOSIS — N2581 Secondary hyperparathyroidism of renal origin: Secondary | ICD-10-CM | POA: Diagnosis not present

## 2022-03-13 DIAGNOSIS — N186 End stage renal disease: Secondary | ICD-10-CM | POA: Diagnosis not present

## 2022-03-14 ENCOUNTER — Encounter: Payer: Self-pay | Admitting: Gastroenterology

## 2022-03-14 ENCOUNTER — Telehealth: Payer: Self-pay

## 2022-03-14 ENCOUNTER — Ambulatory Visit (INDEPENDENT_AMBULATORY_CARE_PROVIDER_SITE_OTHER): Payer: Medicare HMO | Admitting: Gastroenterology

## 2022-03-14 VITALS — HR 89 | Ht 74.0 in | Wt 299.1 lb

## 2022-03-14 DIAGNOSIS — N186 End stage renal disease: Secondary | ICD-10-CM

## 2022-03-14 DIAGNOSIS — Z992 Dependence on renal dialysis: Secondary | ICD-10-CM

## 2022-03-14 DIAGNOSIS — Z1211 Encounter for screening for malignant neoplasm of colon: Secondary | ICD-10-CM

## 2022-03-14 DIAGNOSIS — I480 Paroxysmal atrial fibrillation: Secondary | ICD-10-CM

## 2022-03-14 DIAGNOSIS — I4891 Unspecified atrial fibrillation: Secondary | ICD-10-CM

## 2022-03-14 DIAGNOSIS — G4733 Obstructive sleep apnea (adult) (pediatric): Secondary | ICD-10-CM

## 2022-03-14 NOTE — Patient Instructions (Signed)
You have been scheduled for an endoscopy. Please follow written instructions given to you at your visit today. If you use inhalers (even only as needed), please bring them with you on the day of your procedure.   You will be contaced by our office prior to your procedure for directions on holding your Eliquis.  If you do not hear from our office 1 week prior to your scheduled procedure, please call [phone] to discuss.   Due to recent changes in healthcare laws, you may see the results of your imaging and laboratory studies on MyChart before your provider has had a chance to review them.  We understand that in some cases there may be results that are confusing or concerning to you. Not all laboratory results come back in the same time frame and the provider may be waiting for multiple results in order to interpret others.  Please give [place] 48 hours in order for your provider to thoroughly review all the results before contacting the office for clarification of your results.     Thank you for choosing me and [facility].  Vito Cirigliano, D.O.

## 2022-03-14 NOTE — Telephone Encounter (Signed)
   Request for Surgical Clearance     Procedure:   COLONOSCOPY   Date of Surgery:  05/20/22                                 Surgeon:  DR. Bryan Lemma Surgeon's Group or Practice Name:  [facility] Phone number:  [phone] Fax number:  [phone] ATTN; Abi Shoults   Type of Clearance Requested:   - Pharmacy : Holding Eliquis for 2 days   Type of Anesthesia:   PROPOFOL   Sincerely,   [facility]

## 2022-03-14 NOTE — Progress Notes (Signed)
Chief Complaint: Colon cancer screening, medication management   Referring Provider:     Haydee Salter, MD    HPI:     [name] is a 46 y.o. male with a history of ESRD on HD (s/p kidney transplant which failed in 2014), vasculopathy, obesity (BMI 38), paroxysmal A-fib (on Eliquis), history of occluded IVC with collateralization via left renal vein, HTN, OSA, anemia of chronic disease, referred to the Gastroenterology Clinic for evaluation of high risk colon cancer screening and medication management in the perioperative setting.  Did have recent placement of left common femoral tunneled dialysis catheter on 03/06/2022.  Does have occluded IVC with collateralization mostly in the left renal vein. No issue with sedation with HD cath placement last week. He held his Eliquis for that procedure without issue.   Currently being evaluated at [facility] for renal transplant.   No recent hematochezia, melena, change in bowel habits.   No prior colonoscopy.   No known family history of CRC, GI malignancy, liver disease, pancreatic disease, or IBD.     Past Medical History:  Diagnosis Date   Acute respiratory failure with hypoxia ([place]) 11/30/2018   Anemia    ESRD   ESRD on hemodialysis ([place]) 06/07/2011   TTS [facility]. Started HD in 2006, got transplant in 2014 lasted until Dec 2019 then went back on HD.  Has L thigh AVG as of Jun 2020.  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    Hypertension    Morbid obesity ([place])    Paroxysmal atrial fibrillation ([place])    Sepsis ([place]) 11/30/2018   Sleep apnea      Past Surgical History:  Procedure Laterality Date   ARTERIOVENOUS GRAFT PLACEMENT  08/09/2010   Left Thigh Graft by Dr. [name]   AV FISTULA PLACEMENT Right 05/18/2018   Procedure: INSERTION OF ARTERIOVENOUS (AV) GORE-TEX GRAFT Left THIGH;  Surgeon: Waynetta Sandy, MD;  Location: [place];  Service: Vascular;  Laterality: Right;   AV FISTULA  PLACEMENT Right 02/15/2021   Procedure: INSERTION OF ARTERIOVENOUS (AV) GORE-TEX LOOP GRAFT RIGHT THIGH;  Surgeon: Waynetta Sandy, MD;  Location: [place];  Service: Vascular;  Laterality: Right;   CAPD REMOVAL  05/08/2011   Procedure: CONTINUOUS AMBULATORY PERITONEAL DIALYSIS  (CAPD) CATHETER REMOVAL;  Surgeon: [name], MD;  Location: [place];  Service: General;  Laterality: N/A;  Removal of CAPD catheter, Dr. requests to go after [place]  10/05/2010   Right Femoral Cath insertion by Dr. [name].  Pt ahas had several caths inserted.   INSERTION OF DIALYSIS CATHETER Right 02/15/2021   Procedure: Attempted INSERTION OF RIGHT and Left Internal Jugular DIALYSIS CATHETER, Insertion of Left Femoral Vein Dialysis Catheter;  Surgeon: Waynetta Sandy, MD;  Location: [facility];  Service: Vascular;  Laterality: Right;   INSERTION OF DIALYSIS CATHETER Right 04/26/2021   Procedure: INSERTION OF TUNNELED 55cm PALIDROME PRECISION CHRONIC DIALYSIS CATHETER;  Surgeon: Cherre Robins, MD;  Location: [place];  Service: Vascular;  Laterality: Right;   INSERTION OF DIALYSIS CATHETER Left 12/26/2021   Procedure: INSERTION OF TUNNELED  DIALYSIS CATHETER LEFT FEMORAL ARTERY;  Surgeon: Waynetta Sandy, MD;  Location: [place];  Service: Vascular;  Laterality: Left;   INSERTION OF DIALYSIS CATHETER Left 03/06/2022   Procedure: INSERTION OF DIALYSIS CATHETER;  Surgeon: Waynetta Sandy, MD;  Location: [facility]  OR;  Service: Vascular;  Laterality: Left;   IR FLUORO GUIDE CV LINE RIGHT  05/11/2018   IR FLUORO GUIDE CV LINE RIGHT  07/03/2021   IR FLUORO GUIDE CV LINE RIGHT  07/16/2021   IR PTA ADDL CENTRAL DIALYSIS SEG THRU DIALY CIRCUIT RIGHT Right 07/03/2021   IR US GUIDE VASC ACCESS RIGHT  05/11/2018   IR VENOCAVAGRAM IVC  07/16/2021   KIDNEY TRANSPLANT  2014   KNEE ARTHROSCOPY Left    THROMBECTOMY W/ EMBOLECTOMY Right 04/26/2021   Procedure: REMOVAL OF RIGHT THIGH  ARTERIOVENOUS GORE-TEX GRAFT AND REPAIR OF RIGHT COMMON FEMORAL ARTERY;  Surgeon: Cherre Robins, MD;  Location: [place];  Service: Vascular;  Laterality: Right;   UPPER EXTREMITY ANGIOGRAPHY Bilateral 05/13/2018   Procedure: UPPER EXTREMITY ANGIOGRAPHY - bilarteral;  Surgeon: Marty Heck, MD;  Location: [place] CV LAB;  Service: Cardiovascular;  Laterality: Bilateral;   Family History  Problem Relation Age of Onset   Diabetes Father    Stroke Father    Stroke Maternal Grandmother    Anesthesia problems Neg Hx    Social History   Tobacco Use   Smoking status: Former    Types: Cigarettes    Quit date: 11/08/2015    Years since quitting: 6.3   Smokeless tobacco: Never   Tobacco comments:    Smokes a cigarette about every 3-4 days.  Vaping Use   Vaping Use: Never used  Substance Use Topics   Alcohol use: Not Currently   Drug use: Yes    Frequency: 10.0 times per week    Types: Marijuana    Comment: 1-2 joints per day   Current Outpatient Medications  Medication Sig Dispense Refill   apixaban (ELIQUIS) 5 MG TABS tablet Take 5 mg by mouth 2 (two) times daily.     atorvastatin (LIPITOR) 40 MG tablet Take 1 tablet (40 mg total) by mouth daily. 90 tablet 3   B Complex-C-Zn-Folic Acid (DIALYVITE/ZINC) TABS Take 1 tablet by mouth daily.     doxycycline (VIBRAMYCIN) 100 MG capsule Take 100 mg by mouth 2 (two) times daily.     ferric citrate (AURYXIA) 1 GM 210 MG(Fe) tablet Take 630 mg by mouth 3 (three) times daily with meals.     No current facility-administered medications for this visit.   No Known Allergies   Review of Systems: All systems reviewed and negative except where noted in HPI.     Physical Exam:    Wt Readings from Last 3 Encounters:  03/14/22 299 lb 2 oz (135.7 kg)  03/06/22 295 lb (133.8 kg)  02/19/22 296 lb (134.3 kg)    Pulse 89   Ht '6\' 2"'$  (1.88 m)   Wt 299 lb 2 oz (135.7 kg)   BMI 38.41 kg/m  Constitutional:  Pleasant, in no acute  distress. Psychiatric: Normal mood and affect. Behavior is normal. Cardiovascular: Normal rate, regular rhythm. No edema Pulmonary/chest: Effort normal and breath sounds normal. No wheezing, rales or rhonchi. Abdominal: Soft, nondistended, nontender. Bowel sounds active throughout. There are no masses palpable. No hepatomegaly. Neurological: Alert and oriented to person place and time. Skin: Skin is warm and dry. No rashes noted.   ASSESSMENT AND PLAN;   1) Colon cancer screening Due for age-appropriate, average risk CRC screening - Schedule colonoscopy at [facility] due to elevated periprocedural risks  2) ESRD 3) Atrial fibrillation 4) Chronic anticoagulation 5) Obesity 6) OSA - As above, procedures to be scheduled at [facility] 2  days before procedure - will instruct when and how to resume after procedure. Low but real risk of cardiovascular event such as heart attack, stroke, embolism, thrombosis or ischemia/infarct of other organs off Eliquis explained and need to seek urgent help if this occurs. The patient consents to proceed. Will communicate by phone or EMR with patient's prescribing provider to confirm that holding Eliquis is reasonable in this case    Lavena Bullion, DO, FACG  03/14/2022, 10:43 AM   Gena Fray, Lillette Boxer, MD

## 2022-03-15 DIAGNOSIS — Z992 Dependence on renal dialysis: Secondary | ICD-10-CM | POA: Diagnosis not present

## 2022-03-15 DIAGNOSIS — N2581 Secondary hyperparathyroidism of renal origin: Secondary | ICD-10-CM | POA: Diagnosis not present

## 2022-03-15 DIAGNOSIS — N186 End stage renal disease: Secondary | ICD-10-CM | POA: Diagnosis not present

## 2022-03-18 DIAGNOSIS — N2581 Secondary hyperparathyroidism of renal origin: Secondary | ICD-10-CM | POA: Diagnosis not present

## 2022-03-18 DIAGNOSIS — Z992 Dependence on renal dialysis: Secondary | ICD-10-CM | POA: Diagnosis not present

## 2022-03-18 DIAGNOSIS — N186 End stage renal disease: Secondary | ICD-10-CM | POA: Diagnosis not present

## 2022-03-19 DIAGNOSIS — S91302A Unspecified open wound, left foot, initial encounter: Secondary | ICD-10-CM | POA: Diagnosis not present

## 2022-03-20 ENCOUNTER — Other Ambulatory Visit: Payer: Self-pay

## 2022-03-20 ENCOUNTER — Encounter (HOSPITAL_COMMUNITY): Payer: Self-pay | Admitting: Vascular Surgery

## 2022-03-20 DIAGNOSIS — N2581 Secondary hyperparathyroidism of renal origin: Secondary | ICD-10-CM | POA: Diagnosis not present

## 2022-03-20 DIAGNOSIS — Z992 Dependence on renal dialysis: Secondary | ICD-10-CM | POA: Diagnosis not present

## 2022-03-20 DIAGNOSIS — N186 End stage renal disease: Secondary | ICD-10-CM | POA: Diagnosis not present

## 2022-03-20 NOTE — Progress Notes (Signed)
[name] denies chest pain or shortness of  breath. Patient denies having any s/s of Covid in his household, also denies any known exposure to Covid.   [name] PCP is Dr Arlester Marker, cardiologist is Dr. Sanda Klein,

## 2022-03-20 NOTE — Anesthesia Preprocedure Evaluation (Addendum)
Anesthesia Evaluation  Patient identified by MRN, date of birth, ID band Patient awake    Reviewed: Allergy & Precautions, NPO status , Patient's Chart, lab work & pertinent test results  History of Anesthesia Complications Negative for: history of anesthetic complications  Airway Mallampati: III  TM Distance: >3 FB Neck ROM: Full    Dental no notable dental hx. (+) Dental Advisory Given   Pulmonary sleep apnea (does not use CPAP) , former smoker   Pulmonary exam normal        Cardiovascular hypertension, Normal cardiovascular exam+ dysrhythmias Atrial Fibrillation   '21 stress ECHO: Baseline regional wall motion abnormalities were not present. There were no stress-induced wall motion abnormalities. This is a  negative stress echocardiogram for ischemia.   '20 ECHO: EF 55-60%, normal LVF, RV has severely reduced systolic function. The cavity was moderately enlarged, no significant valvular abnormalities   Neuro/Psych negative neurological ROS     GI/Hepatic negative GI ROS,,,(+)     substance abuse  marijuana use  Endo/Other    Morbid obesityBMI 37.9  Renal/GU ESRF and DialysisRenal disease (TuThSa)K+ 4.5     Musculoskeletal   Abdominal   Peds  Hematology eliquis   Anesthesia Other Findings   Reproductive/Obstetrics                             Anesthesia Physical  Anesthesia Plan  ASA: 3  Anesthesia Plan: General   Post-op Pain Management: Tylenol PO (pre-op)*   Induction: Intravenous  PONV Risk Score and Plan: 3 and Ondansetron, Dexamethasone and Midazolam  Airway Management Planned: Oral ETT  Additional Equipment: None  Intra-op Plan:   Post-operative Plan: Extubation in OR  Informed Consent: I have reviewed the patients History and Physical, chart, labs and discussed the procedure including the risks, benefits and alternatives for the proposed anesthesia with the  patient or authorized representative who has indicated his/her understanding and acceptance.     Dental advisory given  Plan Discussed with: Anesthesiologist and CRNA  Anesthesia Plan Comments:         Anesthesia Quick Evaluation

## 2022-03-21 ENCOUNTER — Ambulatory Visit (HOSPITAL_COMMUNITY)
Admission: RE | Admit: 2022-03-21 | Discharge: 2022-03-21 | Disposition: A | Discharge status: Home / Self Care | Payer: Medicare HMO | Source: Ambulatory Visit | Attending: Vascular Surgery | Admitting: Vascular Surgery

## 2022-03-21 ENCOUNTER — Other Ambulatory Visit: Payer: Self-pay

## 2022-03-21 ENCOUNTER — Ambulatory Visit (HOSPITAL_COMMUNITY): Payer: Medicare HMO | Admitting: Certified Registered"

## 2022-03-21 ENCOUNTER — Encounter (HOSPITAL_COMMUNITY): Payer: Self-pay | Admitting: Vascular Surgery

## 2022-03-21 ENCOUNTER — Encounter (HOSPITAL_COMMUNITY)
Admission: RE | Disposition: A | Discharge status: Home / Self Care | Payer: Self-pay | Source: Ambulatory Visit | Attending: Vascular Surgery

## 2022-03-21 DIAGNOSIS — E1122 Type 2 diabetes mellitus with diabetic chronic kidney disease: Secondary | ICD-10-CM | POA: Diagnosis not present

## 2022-03-21 DIAGNOSIS — Z7901 Long term (current) use of anticoagulants: Secondary | ICD-10-CM | POA: Diagnosis not present

## 2022-03-21 DIAGNOSIS — I959 Hypotension, unspecified: Secondary | ICD-10-CM | POA: Diagnosis not present

## 2022-03-21 DIAGNOSIS — Z539 Procedure and treatment not carried out, unspecified reason: Secondary | ICD-10-CM | POA: Diagnosis not present

## 2022-03-21 DIAGNOSIS — I120 Hypertensive chronic kidney disease with stage 5 chronic kidney disease or end stage renal disease: Secondary | ICD-10-CM | POA: Insufficient documentation

## 2022-03-21 DIAGNOSIS — N186 End stage renal disease: Secondary | ICD-10-CM | POA: Insufficient documentation

## 2022-03-21 DIAGNOSIS — Z992 Dependence on renal dialysis: Secondary | ICD-10-CM | POA: Insufficient documentation

## 2022-03-21 HISTORY — DX: Personal history of other medical treatment: Z92.89

## 2022-03-21 LAB — POCT I-STAT, CHEM 8
BUN: 47 mg/dL — ABNORMAL HIGH (ref 6–20)
Calcium, Ion: 0.97 mmol/L — ABNORMAL LOW (ref 1.15–1.40)
Chloride: 101 mmol/L (ref 98–111)
Creatinine, Ser: 14.6 mg/dL — ABNORMAL HIGH (ref 0.61–1.24)
Glucose, Bld: 108 mg/dL — ABNORMAL HIGH (ref 70–99)
HCT: 46 % (ref 39.0–52.0)
Hemoglobin: 15.6 g/dL (ref 13.0–17.0)
Potassium: 5.3 mmol/L — ABNORMAL HIGH (ref 3.5–5.1)
Sodium: 136 mmol/L (ref 135–145)
TCO2: 24 mmol/L (ref 22–32)

## 2022-03-21 LAB — SURGICAL PCR SCREEN
MRSA, PCR: NEGATIVE
Staphylococcus aureus: NEGATIVE

## 2022-03-21 SURGERY — CANCELLED PROCEDURE
Anesthesia: General

## 2022-03-21 MED ORDER — PROPOFOL 10 MG/ML IV BOLUS
INTRAVENOUS | Status: AC
  Filled 2022-03-21: qty 20

## 2022-03-21 MED ORDER — PHENYLEPHRINE 80 MCG/ML (10ML) SYRINGE FOR IV PUSH (FOR BLOOD PRESSURE SUPPORT)
PREFILLED_SYRINGE | INTRAVENOUS | Status: DC | PRN
  Administered 2022-03-21: 160 ug via INTRAVENOUS

## 2022-03-21 MED ORDER — FENTANYL CITRATE (PF) 250 MCG/5ML IJ SOLN
INTRAMUSCULAR | Status: DC | PRN
  Administered 2022-03-21: 100 ug via INTRAVENOUS

## 2022-03-21 MED ORDER — ACETAMINOPHEN 500 MG PO TABS
1000.0000 mg | ORAL_TABLET | Freq: Once | ORAL | Status: AC
  Administered 2022-03-21: 1000 mg via ORAL
  Filled 2022-03-21: qty 2

## 2022-03-21 MED ORDER — HEPARIN 6000 UNIT IRRIGATION SOLUTION
Status: AC
  Filled 2022-03-21: qty 500

## 2022-03-21 MED ORDER — CHLORHEXIDINE GLUCONATE 4 % EX LIQD: 60.0000 mL | Freq: Once | CUTANEOUS | Status: DC

## 2022-03-21 MED ORDER — ALBUMIN HUMAN 5 % IV SOLN: INTRAVENOUS | Status: DC | PRN

## 2022-03-21 MED ORDER — MIDAZOLAM HCL 2 MG/2ML IJ SOLN
INTRAMUSCULAR | Status: AC
  Filled 2022-03-21: qty 2

## 2022-03-21 MED ORDER — 0.9 % SODIUM CHLORIDE (POUR BTL) OPTIME
TOPICAL | Status: DC | PRN
  Administered 2022-03-21: 1000 mL

## 2022-03-21 MED ORDER — FENTANYL CITRATE (PF) 250 MCG/5ML IJ SOLN
INTRAMUSCULAR | Status: AC
  Filled 2022-03-21: qty 5

## 2022-03-21 MED ORDER — BUPIVACAINE-EPINEPHRINE (PF) 0.25% -1:200000 IJ SOLN
INTRAMUSCULAR | Status: AC
  Filled 2022-03-21: qty 30

## 2022-03-21 MED ORDER — CEFAZOLIN IN SODIUM CHLORIDE 3-0.9 GM/100ML-% IV SOLN
3.0000 g | INTRAVENOUS | Status: DC
  Filled 2022-03-21: qty 100

## 2022-03-21 MED ORDER — PHENYLEPHRINE HCL-NACL 20-0.9 MG/250ML-% IV SOLN
INTRAVENOUS | Status: DC | PRN
  Administered 2022-03-21: 35 ug/min via INTRAVENOUS
  Administered 2022-03-21: 65 ug/min via INTRAVENOUS
  Administered 2022-03-21: 75 ug via INTRAVENOUS
  Administered 2022-03-21: 25 ug/min via INTRAVENOUS
  Administered 2022-03-21: 50 ug/min via INTRAVENOUS

## 2022-03-21 MED ORDER — CHLORHEXIDINE GLUCONATE 0.12 % MT SOLN
OROMUCOSAL | Status: AC
  Administered 2022-03-21: 15 mL via OROMUCOSAL
  Filled 2022-03-21: qty 15

## 2022-03-21 MED ORDER — SODIUM CHLORIDE 0.9 % IV SOLN: INTRAVENOUS | Status: DC

## 2022-03-21 MED ORDER — CHLORHEXIDINE GLUCONATE 0.12 % MT SOLN: 15.0000 mL | Freq: Once | OROMUCOSAL | Status: AC

## 2022-03-21 MED ORDER — ORAL CARE MOUTH RINSE: 15.0000 mL | Freq: Once | OROMUCOSAL | Status: AC

## 2022-03-21 MED ORDER — BUPIVACAINE-EPINEPHRINE 0.25% -1:200000 IJ SOLN
INTRAMUSCULAR | Status: DC | PRN
  Administered 2022-03-21: 30 mL

## 2022-03-21 MED ORDER — MIDAZOLAM HCL 2 MG/2ML IJ SOLN
INTRAMUSCULAR | Status: DC | PRN
  Administered 2022-03-21: 2 mg via INTRAVENOUS

## 2022-03-21 NOTE — H&P (Signed)
HPI:   [name] is a 46 y.o. male has a history of end-stage renal disease currently on dialysis via left femoral tunneled dialysis catheter which terminates in the infrarenal IVC.  Catheter continues to work.  He is on Eliquis for previous catheter that thrombosed multiple times.  He is now here to discuss further options including peritoneal dialysis.  He was previously on peritoneal dialysis 10 years ago catheter was removed from transplant was placed.  The transplant and other peritoneal dialysis catheter he does not have any other abdominal surgeries.  Currently on dialysis Tuesdays, Thursdays and Saturdays.       Past Medical History:  Diagnosis Date   Acute respiratory failure with hypoxia ([place]) 11/30/2018   Anemia      ESRD   ESRD on hemodialysis ([place]) 06/07/2011    TTS [facility]. Started HD in 2006, got transplant in 2014 lasted until Dec 2019 then went back on HD.  Has L thigh AVG as of Jun 2020.  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    Hypertension     Morbid obesity ([place])     Paroxysmal atrial fibrillation ([place])     Sepsis ([place]) 11/30/2018   Sleep apnea     Type 2 diabetes mellitus with other diabetic kidney complication ([place]) 09/73/5329         Family History  Problem Relation Age of Onset   Diabetes Father     Stroke Father     Stroke Maternal Grandmother     Anesthesia problems Neg Hx           Past Surgical History:  Procedure Laterality Date   ARTERIOVENOUS GRAFT PLACEMENT   08/09/2010    Left Thigh Graft by Dr. [name]   AV FISTULA PLACEMENT Right 05/18/2018    Procedure: INSERTION OF ARTERIOVENOUS (AV) GORE-TEX GRAFT Left THIGH;  Surgeon: Waynetta Sandy, MD;  Location: [facility];  Service: Vascular;  Laterality: Right;   AV FISTULA PLACEMENT Right 02/15/2021    Procedure: INSERTION OF ARTERIOVENOUS (AV) GORE-TEX LOOP GRAFT RIGHT THIGH;  Surgeon: Waynetta Sandy, MD;  Location: [facility];  Service: Vascular;   Laterality: Right;   CAPD REMOVAL   05/08/2011    Procedure: CONTINUOUS AMBULATORY PERITONEAL DIALYSIS  (CAPD) CATHETER REMOVAL;  Surgeon: [name], MD;  Location: [facility];  Service: General;  Laterality: N/A;  Removal of CAPD catheter, Dr. requests to go after 100   INSERTION OF DIALYSIS CATHETER   10/05/2010    Right Femoral Cath insertion by Dr. [name].  Pt ahas had several caths inserted.   INSERTION OF DIALYSIS CATHETER Right 02/15/2021    Procedure: Attempted INSERTION OF RIGHT and Left Internal Jugular DIALYSIS CATHETER, Insertion of Left Femoral Vein Dialysis Catheter;  Surgeon: Waynetta Sandy, MD;  Location: [place];  Service: Vascular;  Laterality: Right;   INSERTION OF DIALYSIS CATHETER Right 04/26/2021    Procedure: INSERTION OF TUNNELED 55cm PALIDROME PRECISION CHRONIC DIALYSIS CATHETER;  Surgeon: Cherre Robins, MD;  Location: [facility];  Service: Vascular;  Laterality: Right;   INSERTION OF DIALYSIS CATHETER Left 12/26/2021    Procedure: INSERTION OF TUNNELED  DIALYSIS CATHETER LEFT FEMORAL ARTERY;  Surgeon: Waynetta Sandy, MD;  Location: [facility];  Service: Vascular;  Laterality: Left;   IR FLUORO GUIDE CV LINE RIGHT   05/11/2018   IR FLUORO GUIDE CV LINE RIGHT   07/03/2021   IR FLUORO GUIDE CV LINE  RIGHT   07/16/2021   IR PTA ADDL CENTRAL DIALYSIS SEG THRU DIALY CIRCUIT RIGHT Right 07/03/2021   IR US GUIDE VASC ACCESS RIGHT   05/11/2018   IR VENOCAVAGRAM IVC   07/16/2021   KIDNEY TRANSPLANT   2014   KNEE ARTHROSCOPY Left     THROMBECTOMY W/ EMBOLECTOMY Right 04/26/2021    Procedure: REMOVAL OF RIGHT THIGH ARTERIOVENOUS GORE-TEX GRAFT AND REPAIR OF RIGHT COMMON FEMORAL ARTERY;  Surgeon: Cherre Robins, MD;  Location: [facility];  Service: Vascular;  Laterality: Right;   UPPER EXTREMITY ANGIOGRAPHY Bilateral 05/13/2018    Procedure: UPPER EXTREMITY ANGIOGRAPHY - bilarteral;  Surgeon: Marty Heck, MD;  Location: [facility];  Service:  Cardiovascular;  Laterality: Bilateral;      Short Social History:  Social History         Tobacco Use   Smoking status: Former      Types: Cigarettes      Quit date: 11/08/2015      Years since quitting: 6.2   Smokeless tobacco: Never   Tobacco comments:      Smokes a cigarette about every 3-4 days.  Substance Use Topics   Alcohol use: Yes      Comment: Socially      No Known Allergies         Current Outpatient Medications  Medication Sig Dispense Refill   apixaban (ELIQUIS) 5 MG TABS tablet Take 5 mg by mouth 2 (two) times daily.       atorvastatin (LIPITOR) 40 MG tablet Take 1 tablet (40 mg total) by mouth daily. 90 tablet 3   B Complex-C-Zn-Folic Acid (DIALYVITE/ZINC) TABS         ferric citrate (AURYXIA) 1 GM 210 MG(Fe) tablet Take 630 mg by mouth 3 (three) times daily with meals.        No current facility-administered medications for this visit.      Review of Systems  Constitutional:  Constitutional negative. HENT: HENT negative.  Eyes: Eyes negative.  Respiratory: Respiratory negative.  Cardiovascular: Cardiovascular negative.  GI: Gastrointestinal negative.  Musculoskeletal: Musculoskeletal negative.  Skin: Skin negative.  Neurological: Neurological negative. Hematologic: Hematologic/lymphatic negative.  Psychiatric: Psychiatric negative.          Objective:    Vitals:   03/21/22 0607 03/21/22 0609  BP:  (!) 71/48  Pulse: (!) 105   Resp: 20   Temp: 98 F (36.7 C)   SpO2: 96%       Physical Exam HENT:     Nose: Nose normal.  Eyes:     Pupils: Pupils are equal, round, and reactive to light.  Pulmonary:     Effort: Pulmonary effort is normal.  Abdominal:     General: Abdomen is flat.     Palpations: Abdomen is soft.  Neurological:     General: No focal deficit present.     Mental Status: He is alert.           Assessment/Plan:    46 year old male currently the left femoral tunneled dialysis catheter that unfortunately terminates  in his IVC and then the IVC is occluded above this and we will consider this at his last access option for hemodialysis other than lumbar access to be placed possibly by interventional radiology.  He is now here to discuss peritoneal dialysis.  He does have a previous catheter as well as renal transplant catheter was removed when the transplant was placed.  He does not have any other abdominal surgeries.  We have discussed proceeding with continuous ambulatory peritoneal dialysis catheter placement and also discussed with him the high risk nature of this including the risk for injury to surrounding structures in his abdomen as well as the risk of malfunction of the catheter.  He demonstrates good understanding and will plan for capd today in OR.         Waynetta Sandy MD [facility]

## 2022-03-21 NOTE — Progress Notes (Signed)
    Patient was taken to the operating room with plans for continuous ambulatory peritoneal dialysis catheter placement.  Unfortunately when placed on the table the patient became extremely hypotensive requiring vasopressor support and in discussion with anesthesia we elected not to induce general anesthesia.  At that time the case was canceled and will need to be rescheduled in the future.  August Longest C. Donzetta Matters, MD [facility] Office: [phone] Pager: [phone]

## 2022-03-21 NOTE — Progress Notes (Signed)
Patient discharge he is awake and respirations even unlabored skin warm and dry

## 2022-03-21 NOTE — Transfer of Care (Signed)
Immediate Anesthesia Transfer of Care Note  Patient: [name]  Procedure(s) Performed: CANCELLED PROCEDURE  Patient Location: PACU  Anesthesia Type:General  Level of Consciousness: awake, alert  and oriented  Airway & Oxygen Therapy: Patient Spontanous Breathing and Patient connected to face mask oxygen  Post-op Assessment: Report given to RN and Post -op Vital signs reviewed and unstable, Anesthesiologist notified  Post vital signs: Reviewed and unstable  Last Vitals:  Vitals Value Taken Time  BP 79/23 03/21/22 0836  Temp 36.7 C 03/21/22 0804  Pulse 68 03/21/22 0836  Resp 15 03/21/22 0838  SpO2 94 % 03/21/22 0836  Vitals shown include unvalidated device data.  Last Pain:  Vitals:   03/21/22 0804  TempSrc:   PainSc: 0-No pain         Complications: low blood pressure, surgery aborted

## 2022-03-22 DIAGNOSIS — Z992 Dependence on renal dialysis: Secondary | ICD-10-CM | POA: Diagnosis not present

## 2022-03-22 DIAGNOSIS — N2581 Secondary hyperparathyroidism of renal origin: Secondary | ICD-10-CM | POA: Diagnosis not present

## 2022-03-22 DIAGNOSIS — N186 End stage renal disease: Secondary | ICD-10-CM | POA: Diagnosis not present

## 2022-03-24 ENCOUNTER — Telehealth: Payer: Self-pay

## 2022-03-24 NOTE — Telephone Encounter (Signed)
Transition Care Management Unsuccessful Follow-up Telephone Call  Date of discharge and from where:  03/21/22 [name]  Attempts:  1st Attempt  Reason for unsuccessful TCM follow-up call:  Left voice message

## 2022-03-25 DIAGNOSIS — N2581 Secondary hyperparathyroidism of renal origin: Secondary | ICD-10-CM | POA: Diagnosis not present

## 2022-03-25 DIAGNOSIS — Z992 Dependence on renal dialysis: Secondary | ICD-10-CM | POA: Diagnosis not present

## 2022-03-25 DIAGNOSIS — N186 End stage renal disease: Secondary | ICD-10-CM | POA: Diagnosis not present

## 2022-03-27 DIAGNOSIS — N2581 Secondary hyperparathyroidism of renal origin: Secondary | ICD-10-CM | POA: Diagnosis not present

## 2022-03-27 DIAGNOSIS — Z992 Dependence on renal dialysis: Secondary | ICD-10-CM | POA: Diagnosis not present

## 2022-03-27 DIAGNOSIS — N186 End stage renal disease: Secondary | ICD-10-CM | POA: Diagnosis not present

## 2022-03-28 NOTE — Telephone Encounter (Signed)
Transition Care Management Follow-up Telephone Call Date of discharge and from where: 03/21/22 How have you been since you were released from the hospital? I'm ok. Any questions or concerns? No  Items Reviewed: Did the pt receive and understand the discharge instructions provided? No  Medications obtained and verified? No  Other? No  Any new allergies since your discharge? No  Dietary orders reviewed? No Do you have support at home? Yes   Home Care and Equipment/Supplies: Were home health services ordered? not applicable If so, what is the name of the agency? N/a  Has the agency set up a time to come to the patient's home? not applicable Were any new equipment or medical supplies ordered?  No What is the name of the medical supply agency? N/a Were you able to get the supplies/equipment? not applicable Do you have any questions related to the use of the equipment or supplies? No  Functional Questionnaire: (I = Independent and D = Dependent) ADLs: I  Bathing/Dressing- I  Meal Prep- I  Eating- I  Maintaining continence- I  Transferring/Ambulation- I  Managing Meds- I  Follow up appointments reviewed:  PCP Hospital f/u appt confirmed? No  Scheduled to see n/a on n/a @ n/a. [facility] f/u appt confirmed? No  Scheduled to see n/a on n/a @ n/a. Are transportation arrangements needed? No  If their condition worsens, is the pt aware to call PCP or go to the Emergency Dept.? Yes Was the patient provided with contact information for the PCP's office or ED? Yes Was to pt encouraged to call back with questions or concerns? Yes  Angeline Slim, RN, BSN

## 2022-03-29 DIAGNOSIS — Z992 Dependence on renal dialysis: Secondary | ICD-10-CM | POA: Diagnosis not present

## 2022-03-29 DIAGNOSIS — N186 End stage renal disease: Secondary | ICD-10-CM | POA: Diagnosis not present

## 2022-03-29 DIAGNOSIS — N2581 Secondary hyperparathyroidism of renal origin: Secondary | ICD-10-CM | POA: Diagnosis not present

## 2022-03-31 ENCOUNTER — Telehealth: Payer: Self-pay

## 2022-03-31 NOTE — Patient Instructions (Signed)
Visit Information  Thank you for taking time to visit with me today. Please don't hesitate to contact me if I can be of assistance to you.   Following are the goals we discussed today:   Goals Addressed             This Visit's Progress    Health Maintainance       Care Coordination Interventions: Evaluation of current treatment plan related to ESRD management and patient's adherence to plan as established by provider Discussed plans with patient for ongoing care management follow up and provided patient with direct contact information for care management team  Patient planning to do Peritoneal dialysis as he has no further options for hemodialysis.  Peritoneal access procedure cancelled recently due to low blood pressure.  Patient waiting for reschedule.          Our next appointment is by telephone on 04/28/22 at 10am  Please call the care guide team at [phone] if you need to cancel or reschedule your appointment.   If you are experiencing a Mental Health or [place] or need someone to talk to, please call the Suicide and Crisis Lifeline: 988   Patient verbalizes understanding of instructions and care plan provided today and agrees to view in [place]. Active MyChart status and patient understanding of how to access instructions and care plan via MyChart confirmed with patient.     Telephone follow up appointment with care management team member scheduled for: November  Dao Memmott J Armandina Iman, RN, MSN [place] [facility] Care Management Coordinator Direct Line [phone]

## 2022-03-31 NOTE — Patient Outreach (Signed)
  Care Coordination   Follow Up Visit Note   03/31/2022 Name: JACQUESE HACKMAN MRN: [id number] DOB: Apr 28, 1976  Valentino Nose Nolden is a 46 y.o. year old male who sees Rudd, Lillette Boxer, MD for primary care. I spoke with  Valentino Nose Marsch by phone today.  What matters to the patients health and wellness today?  Getting peritoneal dialysis access    Goals Addressed             This Visit's Progress    Health Maintainance       Care Coordination Interventions: Evaluation of current treatment plan related to ESRD management and patient's adherence to plan as established by provider Discussed plans with patient for ongoing care management follow up and provided patient with direct contact information for care management team  Patient planning to do Peritoneal dialysis as he has no further options for hemodialysis.  Peritoneal access procedure cancelled recently due to low blood pressure.  Patient waiting for reschedule.          SDOH assessments and interventions completed:  Yes     Care Coordination Interventions Activated:  Yes  Care Coordination Interventions:  Yes, provided   Follow up plan: Follow up call scheduled for November    Encounter Outcome:  Pt. Visit Completed   Jone Baseman, RN, MSN [place] [facility] Care Management Coordinator Direct Line [phone]

## 2022-04-01 DIAGNOSIS — Z992 Dependence on renal dialysis: Secondary | ICD-10-CM | POA: Diagnosis not present

## 2022-04-01 DIAGNOSIS — N186 End stage renal disease: Secondary | ICD-10-CM | POA: Diagnosis not present

## 2022-04-01 DIAGNOSIS — N2581 Secondary hyperparathyroidism of renal origin: Secondary | ICD-10-CM | POA: Diagnosis not present

## 2022-04-01 NOTE — Telephone Encounter (Signed)
Received surgical clearance from Dr. Moshe Cipro. Patient is informed to hold Eliquis 2 days prior to colonoscopy.

## 2022-04-03 DIAGNOSIS — N2581 Secondary hyperparathyroidism of renal origin: Secondary | ICD-10-CM | POA: Diagnosis not present

## 2022-04-03 DIAGNOSIS — Z992 Dependence on renal dialysis: Secondary | ICD-10-CM | POA: Diagnosis not present

## 2022-04-03 DIAGNOSIS — N186 End stage renal disease: Secondary | ICD-10-CM | POA: Diagnosis not present

## 2022-04-05 DIAGNOSIS — Z992 Dependence on renal dialysis: Secondary | ICD-10-CM | POA: Diagnosis not present

## 2022-04-05 DIAGNOSIS — N186 End stage renal disease: Secondary | ICD-10-CM | POA: Diagnosis not present

## 2022-04-05 DIAGNOSIS — N2581 Secondary hyperparathyroidism of renal origin: Secondary | ICD-10-CM | POA: Diagnosis not present

## 2022-04-08 DIAGNOSIS — N2581 Secondary hyperparathyroidism of renal origin: Secondary | ICD-10-CM | POA: Diagnosis not present

## 2022-04-08 DIAGNOSIS — N186 End stage renal disease: Secondary | ICD-10-CM | POA: Diagnosis not present

## 2022-04-08 DIAGNOSIS — N041 Nephrotic syndrome with focal and segmental glomerular lesions: Secondary | ICD-10-CM | POA: Diagnosis not present

## 2022-04-08 DIAGNOSIS — Z992 Dependence on renal dialysis: Secondary | ICD-10-CM | POA: Diagnosis not present

## 2022-04-10 DIAGNOSIS — N186 End stage renal disease: Secondary | ICD-10-CM | POA: Diagnosis not present

## 2022-04-10 DIAGNOSIS — N2581 Secondary hyperparathyroidism of renal origin: Secondary | ICD-10-CM | POA: Diagnosis not present

## 2022-04-10 DIAGNOSIS — Z992 Dependence on renal dialysis: Secondary | ICD-10-CM | POA: Diagnosis not present

## 2022-04-12 DIAGNOSIS — N2581 Secondary hyperparathyroidism of renal origin: Secondary | ICD-10-CM | POA: Diagnosis not present

## 2022-04-12 DIAGNOSIS — N186 End stage renal disease: Secondary | ICD-10-CM | POA: Diagnosis not present

## 2022-04-12 DIAGNOSIS — Z992 Dependence on renal dialysis: Secondary | ICD-10-CM | POA: Diagnosis not present

## 2022-04-15 DIAGNOSIS — N186 End stage renal disease: Secondary | ICD-10-CM | POA: Diagnosis not present

## 2022-04-15 DIAGNOSIS — Z992 Dependence on renal dialysis: Secondary | ICD-10-CM | POA: Diagnosis not present

## 2022-04-15 DIAGNOSIS — N2581 Secondary hyperparathyroidism of renal origin: Secondary | ICD-10-CM | POA: Diagnosis not present

## 2022-04-17 DIAGNOSIS — Z992 Dependence on renal dialysis: Secondary | ICD-10-CM | POA: Diagnosis not present

## 2022-04-17 DIAGNOSIS — N186 End stage renal disease: Secondary | ICD-10-CM | POA: Diagnosis not present

## 2022-04-17 DIAGNOSIS — N2581 Secondary hyperparathyroidism of renal origin: Secondary | ICD-10-CM | POA: Diagnosis not present

## 2022-04-19 DIAGNOSIS — N2581 Secondary hyperparathyroidism of renal origin: Secondary | ICD-10-CM | POA: Diagnosis not present

## 2022-04-19 DIAGNOSIS — Z992 Dependence on renal dialysis: Secondary | ICD-10-CM | POA: Diagnosis not present

## 2022-04-19 DIAGNOSIS — N186 End stage renal disease: Secondary | ICD-10-CM | POA: Diagnosis not present

## 2022-04-22 ENCOUNTER — Other Ambulatory Visit: Payer: Self-pay | Admitting: Student

## 2022-04-23 ENCOUNTER — Ambulatory Visit (HOSPITAL_COMMUNITY)
Admission: RE | Admit: 2022-04-23 | Discharge: 2022-04-23 | Disposition: A | Discharge status: Home / Self Care | Payer: Medicare HMO | Source: Ambulatory Visit | Attending: Nephrology | Admitting: Nephrology

## 2022-04-23 ENCOUNTER — Encounter (HOSPITAL_COMMUNITY): Payer: Self-pay

## 2022-04-23 ENCOUNTER — Other Ambulatory Visit: Payer: Self-pay

## 2022-04-23 ENCOUNTER — Other Ambulatory Visit (HOSPITAL_COMMUNITY): Payer: Self-pay | Admitting: Nephrology

## 2022-04-23 DIAGNOSIS — I82220 Acute embolism and thrombosis of inferior vena cava: Secondary | ICD-10-CM | POA: Diagnosis not present

## 2022-04-23 DIAGNOSIS — Y848 Other medical procedures as the cause of abnormal reaction of the patient, or of later complication, without mention of misadventure at the time of the procedure: Secondary | ICD-10-CM | POA: Insufficient documentation

## 2022-04-23 DIAGNOSIS — N186 End stage renal disease: Secondary | ICD-10-CM | POA: Insufficient documentation

## 2022-04-23 DIAGNOSIS — Z4901 Encounter for fitting and adjustment of extracorporeal dialysis catheter: Secondary | ICD-10-CM | POA: Diagnosis not present

## 2022-04-23 DIAGNOSIS — Z992 Dependence on renal dialysis: Secondary | ICD-10-CM | POA: Insufficient documentation

## 2022-04-23 DIAGNOSIS — T82898A Other specified complication of vascular prosthetic devices, implants and grafts, initial encounter: Secondary | ICD-10-CM | POA: Insufficient documentation

## 2022-04-23 HISTORY — PX: IR FLUORO GUIDE CV LINE LEFT: IMG2282

## 2022-04-23 HISTORY — PX: IR VENO/EXT/UNI LEFT: IMG675

## 2022-04-23 HISTORY — PX: IR US GUIDE VASC ACCESS LEFT: IMG2389

## 2022-04-23 MED ORDER — FENTANYL CITRATE (PF) 100 MCG/2ML IJ SOLN
INTRAMUSCULAR | Status: AC
  Filled 2022-04-23: qty 2

## 2022-04-23 MED ORDER — HEPARIN SODIUM (PORCINE) 1000 UNIT/ML IJ SOLN
INTRAMUSCULAR | Status: AC
  Filled 2022-04-23: qty 10

## 2022-04-23 MED ORDER — ACETAMINOPHEN 325 MG PO TABS
650.0000 mg | ORAL_TABLET | Freq: Once | ORAL | Status: AC
  Administered 2022-04-23: 650 mg via ORAL
  Filled 2022-04-23: qty 2

## 2022-04-23 MED ORDER — FENTANYL CITRATE (PF) 100 MCG/2ML IJ SOLN
INTRAMUSCULAR | Status: AC | PRN
  Administered 2022-04-23: 50 ug via INTRAVENOUS

## 2022-04-23 MED ORDER — MIDAZOLAM HCL 2 MG/2ML IJ SOLN
INTRAMUSCULAR | Status: AC | PRN
  Administered 2022-04-23: 1 mg via INTRAVENOUS

## 2022-04-23 MED ORDER — CEFAZOLIN SODIUM-DEXTROSE 2-4 GM/100ML-% IV SOLN
INTRAVENOUS | Status: AC
  Filled 2022-04-23: qty 100

## 2022-04-23 MED ORDER — CEFAZOLIN IN SODIUM CHLORIDE 3-0.9 GM/100ML-% IV SOLN
3.0000 g | INTRAVENOUS | Status: AC
  Administered 2022-04-23: 3 g via INTRAVENOUS
  Filled 2022-04-23: qty 100

## 2022-04-23 MED ORDER — IOHEXOL 300 MG/ML  SOLN
50.0000 mL | Freq: Once | INTRAMUSCULAR | Status: AC | PRN
  Administered 2022-04-23: 25 mL via INTRAVENOUS

## 2022-04-23 MED ORDER — MIDAZOLAM HCL 2 MG/2ML IJ SOLN
INTRAMUSCULAR | Status: AC
  Filled 2022-04-23: qty 2

## 2022-04-23 MED ORDER — SODIUM CHLORIDE 0.9 % IV SOLN: INTRAVENOUS | Status: DC

## 2022-04-23 MED ORDER — LIDOCAINE HCL 1 % IJ SOLN
INTRAMUSCULAR | Status: AC
  Administered 2022-04-23: 10 mL
  Filled 2022-04-23: qty 20

## 2022-04-23 NOTE — H&P (Signed)
Chief Complaint: Patient was seen in consultation today for hemodialysis catheter placement at the request of [place]  Referring Physician(s): [place],Kellie  Supervising Physician: Sandi Mariscal  Patient Status: [facility] - Out-pt  History of Present Illness: [name] is a 46 y.o. male   ESRD Last dialysis 04/19/22-- complete Left femoral cath placed by Dr Donzetta Matters 02/2022 Had attempted PD cath placement 03/21/22 in OR--- unable to proceed secondary hypotension Pt still hoping for PD one day Hx renal transplant from 2014 - 2019; then back to hemodialysis  Stitch came out at Left femoral site and catheter dislodged 11/13 Scheduled now for replacement/new placement   Past Medical History:  Diagnosis Date   Acute respiratory failure with hypoxia ([place]) 11/30/2018   Anemia    ESRD   ESRD on hemodialysis ([place]) 06/07/2011   TTS [facility]. Started HD in 2006, got transplant in 2014 lasted until Dec 2019 then went back on HD.  Has L thigh AVG as of Jun 2020.  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    History of blood transfusion    Hypertension    03/19/22- has not had high blood pressure in 5 years.   Morbid obesity ([place])    Paroxysmal atrial fibrillation ([place])    Sepsis ([place]) 11/30/2018   Sleep apnea     Past Surgical History:  Procedure Laterality Date   ARTERIOVENOUS GRAFT PLACEMENT  08/09/2010   Left Thigh Graft by Dr. [name]   AV FISTULA PLACEMENT Right 05/18/2018   Procedure: INSERTION OF ARTERIOVENOUS (AV) GORE-TEX GRAFT Left THIGH;  Surgeon: Waynetta Sandy, MD;  Location: [place] [facility];  Service: Vascular;  Laterality: Right;   AV FISTULA PLACEMENT Right 02/15/2021   Procedure: INSERTION OF ARTERIOVENOUS (AV) GORE-TEX LOOP GRAFT RIGHT THIGH;  Surgeon: Waynetta Sandy, MD;  Location: [facility];  Service: Vascular;  Laterality: Right;   CAPD REMOVAL  05/08/2011   Procedure: CONTINUOUS AMBULATORY PERITONEAL DIALYSIS  (CAPD)  CATHETER REMOVAL;  Surgeon: [name], MD;  Location: [facility];  Service: General;  Laterality: N/A;  Removal of CAPD catheter, Dr. requests to go after [place]  10/05/2010   Right Femoral Cath insertion by Dr. [name].  Pt ahas had several caths inserted.   INSERTION OF DIALYSIS CATHETER Right 02/15/2021   Procedure: Attempted INSERTION OF RIGHT and Left Internal Jugular DIALYSIS CATHETER, Insertion of Left Femoral Vein Dialysis Catheter;  Surgeon: Waynetta Sandy, MD;  Location: [place];  Service: Vascular;  Laterality: Right;   INSERTION OF DIALYSIS CATHETER Right 04/26/2021   Procedure: INSERTION OF TUNNELED 55cm PALIDROME PRECISION CHRONIC DIALYSIS CATHETER;  Surgeon: Cherre Robins, MD;  Location: [place];  Service: Vascular;  Laterality: Right;   INSERTION OF DIALYSIS CATHETER Left 12/26/2021   Procedure: INSERTION OF TUNNELED  DIALYSIS CATHETER LEFT FEMORAL ARTERY;  Surgeon: Waynetta Sandy, MD;  Location: [place];  Service: Vascular;  Laterality: Left;   INSERTION OF DIALYSIS CATHETER Left 03/06/2022   Procedure: INSERTION OF DIALYSIS CATHETER;  Surgeon: Waynetta Sandy, MD;  Location: [place];  Service: Vascular;  Laterality: Left;   IR FLUORO GUIDE CV LINE RIGHT  05/11/2018   IR FLUORO GUIDE CV LINE RIGHT  07/03/2021   IR FLUORO GUIDE CV LINE RIGHT  07/16/2021   IR PTA ADDL CENTRAL DIALYSIS SEG THRU DIALY CIRCUIT RIGHT Right 07/03/2021   IR US GUIDE VASC ACCESS RIGHT  05/11/2018   IR VENOCAVAGRAM IVC  07/16/2021   KIDNEY TRANSPLANT  2014   KNEE ARTHROSCOPY Left    THROMBECTOMY W/ EMBOLECTOMY Right 04/26/2021   Procedure: REMOVAL OF RIGHT THIGH ARTERIOVENOUS GORE-TEX GRAFT AND REPAIR OF RIGHT COMMON FEMORAL ARTERY;  Surgeon: Cherre Robins, MD;  Location: [place];  Service: Vascular;  Laterality: Right;   UPPER EXTREMITY ANGIOGRAPHY Bilateral 05/13/2018   Procedure: UPPER EXTREMITY ANGIOGRAPHY - bilarteral;  Surgeon: Marty Heck, MD;  Location: [facility];  Service: Cardiovascular;  Laterality: Bilateral;    Allergies: Patient has no known allergies.  Medications: Prior to Admission medications   Medication Sig Start Date End Date Taking? Authorizing Provider  apixaban (ELIQUIS) 5 MG TABS tablet Take 5 mg by mouth 2 (two) times daily. 10/10/21  Yes [provider]  atorvastatin (LIPITOR) 40 MG tablet Take 1 tablet (40 mg total) by mouth daily. 12/20/21  Yes Haydee Salter, MD  Calcium Carb-Cholecalciferol (CALCIUM 600 + D PO) Take 2 tablets by mouth daily.   Yes [provider]  ferric citrate (AURYXIA) 1 GM 210 MG(Fe) tablet Take 210-630 mg by mouth See admin instructions. Take 630 mg with each meal and 210 mg with each snack 09/06/19  Yes [provider]  ibuprofen (ADVIL) 800 MG tablet Take 800 mg by mouth every 8 (eight) hours as needed for moderate pain.   Yes [provider]  mupirocin ointment (BACTROBAN) 2 % Apply 1 Application topically 2 (two) times daily. 01/31/22  Yes [provider]  B Complex-C-Zn-Folic Acid (DIALYVITE/ZINC) TABS Take 1 tablet by mouth daily. 01/16/22   [provider]     Family History  Problem Relation Age of Onset   Diabetes Father    Stroke Father    Stroke Maternal Grandmother    Anesthesia problems Neg Hx     Social History   Socioeconomic History   Marital status: Single    Spouse name: Not on file   Number of children: 0   Years of education: Not on file   Highest education level: Not on file  Occupational History   Not on file  Tobacco Use   Smoking status: Former    Types: Cigarettes    Quit date: 11/08/2015    Years since quitting: 6.4   Smokeless tobacco: Never  Vaping Use   Vaping Use: Never used  Substance and Sexual Activity   Alcohol use: Not Currently   Drug use: Not Currently    Types: Marijuana   Sexual activity: Yes  Other Topics Concern   Not on file  Social History  Narrative   Lives alone.    Social Determinants of Health   Financial Resource Strain: Low Risk  (08/14/2021)   Overall Financial Resource Strain (CARDIA)    Difficulty of Paying Living Expenses: Not hard at all  Food Insecurity: No Food Insecurity (08/14/2021)   Hunger Vital Sign    Worried About Running Out of Food in the Last Year: Never true    Ran Out of Food in the Last Year: Never true  Transportation Needs: No Transportation Needs (12/31/2021)   PRAPARE - Hydrologist (Medical): No    Lack of Transportation (Non-Medical): No  Physical Activity: Inactive (08/14/2021)   Exercise Vital Sign    Days of Exercise per Week: 0 days    Minutes of Exercise per Session: 0 min  Stress: No Stress Concern Present (08/14/2021)   [place]  Feeling of Stress : Not at all  Social Connections: Socially Isolated (08/14/2021)   Social Connection and Isolation Panel [NHANES]    Frequency of Communication with Friends and Family: Twice a week    Frequency of Social Gatherings with Friends and Family: Twice a week    Attends Religious Services: Never    Marine scientist or Organizations: No    Attends Music therapist: Never    Marital Status: Never married    Review of Systems: A 12 point ROS discussed and pertinent positives are indicated in the HPI above.  All other systems are negative.  Review of Systems  Constitutional:  Negative for activity change, fatigue and fever.  Respiratory:  Negative for cough and shortness of breath.   Cardiovascular:  Negative for chest pain.  Gastrointestinal:  Negative for abdominal pain.  Neurological:  Negative for weakness.  Psychiatric/Behavioral:  Negative for behavioral problems and confusion.     Vital Signs: BP 99/76   Pulse 84   Temp 97.6 F (36.4 C) (Temporal)   Resp 18   Ht '6\' 2"'$  (1.88 m)   Wt (!) 302 lb 0.5 oz (137 kg)   SpO2 99%    BMI 38.78 kg/m     Physical Exam Vitals reviewed.  HENT:     Mouth/Throat:     Mouth: Mucous membranes are moist.  Cardiovascular:     Rate and Rhythm: Normal rate and regular rhythm.     Heart sounds: Normal heart sounds.  Pulmonary:     Effort: Pulmonary effort is normal.     Breath sounds: Normal breath sounds.  Abdominal:     Palpations: Abdomen is soft.  Musculoskeletal:        General: Normal range of motion.  Skin:    General: Skin is warm.  Neurological:     Mental Status: He is alert and oriented to person, place, and time.  Psychiatric:        Behavior: Behavior normal.     Imaging: No results found.  Labs:  CBC: Recent Labs    04/26/21 1930 07/15/21 1829 12/25/21 1831 03/06/22 0826 03/21/22 0619  WBC 8.3 5.5 4.6  --   --   HGB 11.6* 12.6* 10.9* 14.6 15.6  HCT 35.3* 39.5 35.3* 43.0 46.0  PLT 190 194 165  --   --     COAGS: No results for input(s): "INR", "APTT" in the last 8760 hours.  BMP: Recent Labs    07/15/21 1829 07/16/21 0500 12/25/21 1831 12/26/21 1115 03/06/22 0826 03/21/22 0619  NA 138 138 138 138 134* 136  K 5.7* 5.4* 5.7* 5.9* 4.5 5.3*  CL 97* 96* 99 97* 98 101  CO2 23 21* 16* 16*  --   --   GLUCOSE 75 81 93 74 95 108*  BUN 83* 87* 122* 130* 39* 47*  CALCIUM 9.0 8.7* 9.3 8.9  --   --   CREATININE 23.55* 24.10* 23.19* 24.58* 15.30* 14.60*  GFRNONAA 2* 2* 2* 2*  --   --     LIVER FUNCTION TESTS: Recent Labs    07/16/21 0500 12/25/21 1831 12/26/21 1115  BILITOT 0.4 0.6 0.8  AST 9* 11* 7*  ALT '6 10 8  '$ ALKPHOS 29* 40 33*  PROT 7.0 7.6 7.4  ALBUMIN 3.4* 3.8 3.5    TUMOR MARKERS: No results for input(s): "AFPTM", "CEA", "CA199", "CHROMGRNA" in the last 8760 hours.  Assessment and Plan:  ESRD Left femoral catheter for dialysis dislodged  04/21/22 Placed by Dr Donzetta Matters 02/2022 Scheduled now for replacement/new catheter placement Risks and benefits discussed with the patient including, but not limited to bleeding,  infection, vascular injury, air embolism or even death  All of the patient's questions were answered, patient is agreeable to proceed. Consent signed and in chart.  Thank you for this interesting consult.  I greatly enjoyed meeting [name] and look forward to participating in their care.  A copy of this report was sent to the requesting provider on this date.  Electronically Signed: Lavonia Drafts, PA-C 04/23/2022, 7:22 AM   I spent a total of    25 Minutes in face to face in clinical consultation, greater than 50% of which was counseling/coordinating care for hemodialysis catheter placement

## 2022-04-23 NOTE — Sedation Documentation (Signed)
Dr Pascal Lux will give sedation drugs when femoral access obtained

## 2022-04-23 NOTE — Progress Notes (Signed)
Patient was given discharge instructions. He verbalized understanding. 

## 2022-04-23 NOTE — Procedures (Signed)
Pre-procedure Diagnosis: ESRD Post-procedure Diagnosis: Same  Successful placement of left common femoral vein approach tunneled HD catheter with tips terminating within the central aspect of the left common iliac vein, as was done previously. HD catheter is ready for immediate use.  Complications: None Immediate EBL: Minimal   Ronny Bacon, MD Pager #: [phone]

## 2022-04-23 NOTE — Progress Notes (Signed)
VAST consult received to obtain IV access. Upon arrival at bedside, this RN was notified not to start IV as OR staff are ready for patient and will take care of IV access.

## 2022-04-24 DIAGNOSIS — N186 End stage renal disease: Secondary | ICD-10-CM | POA: Diagnosis not present

## 2022-04-24 DIAGNOSIS — Z992 Dependence on renal dialysis: Secondary | ICD-10-CM | POA: Diagnosis not present

## 2022-04-24 DIAGNOSIS — N2581 Secondary hyperparathyroidism of renal origin: Secondary | ICD-10-CM | POA: Diagnosis not present

## 2022-04-26 DIAGNOSIS — Z992 Dependence on renal dialysis: Secondary | ICD-10-CM | POA: Diagnosis not present

## 2022-04-26 DIAGNOSIS — N2581 Secondary hyperparathyroidism of renal origin: Secondary | ICD-10-CM | POA: Diagnosis not present

## 2022-04-26 DIAGNOSIS — N186 End stage renal disease: Secondary | ICD-10-CM | POA: Diagnosis not present

## 2022-04-28 ENCOUNTER — Ambulatory Visit: Payer: Self-pay

## 2022-04-28 DIAGNOSIS — N186 End stage renal disease: Secondary | ICD-10-CM | POA: Diagnosis not present

## 2022-04-28 DIAGNOSIS — N2581 Secondary hyperparathyroidism of renal origin: Secondary | ICD-10-CM | POA: Diagnosis not present

## 2022-04-28 DIAGNOSIS — Z992 Dependence on renal dialysis: Secondary | ICD-10-CM | POA: Diagnosis not present

## 2022-04-28 NOTE — Patient Outreach (Signed)
  Care Coordination   04/28/2022 Name: [name] MRN: [id number] DOB: 1975/09/08   Care Coordination Outreach Attempts:  An unsuccessful telephone outreach was attempted today to offer the patient information about available care coordination services as a benefit of their health plan.   Follow Up Plan:  Additional outreach attempts will be made to offer the patient care coordination information and services.   Encounter Outcome:  Pt. Request to Call Back Patient at dialysis due to holiday.  Agreeable to call back another time. THN Tip this will not be part of the note when signed-REQUIRED REPORT FIELD DO NOT DELETE (Optional):[id number]} Care Coordination Interventions Activated:  No   Care Coordination Interventions:  No, not indicated    Jone Baseman, RN, MSN [place] [facility] Care Management Coordinator Direct Line [phone]

## 2022-04-30 DIAGNOSIS — N2581 Secondary hyperparathyroidism of renal origin: Secondary | ICD-10-CM | POA: Diagnosis not present

## 2022-04-30 DIAGNOSIS — Z992 Dependence on renal dialysis: Secondary | ICD-10-CM | POA: Diagnosis not present

## 2022-04-30 DIAGNOSIS — N186 End stage renal disease: Secondary | ICD-10-CM | POA: Diagnosis not present

## 2022-05-03 DIAGNOSIS — N186 End stage renal disease: Secondary | ICD-10-CM | POA: Diagnosis not present

## 2022-05-03 DIAGNOSIS — Z992 Dependence on renal dialysis: Secondary | ICD-10-CM | POA: Diagnosis not present

## 2022-05-03 DIAGNOSIS — N2581 Secondary hyperparathyroidism of renal origin: Secondary | ICD-10-CM | POA: Diagnosis not present

## 2022-05-05 ENCOUNTER — Ambulatory Visit: Payer: Self-pay

## 2022-05-05 NOTE — Patient Outreach (Signed)
  Care Coordination   05/05/2022 Name: [name] MRN: [id number] DOB: 1976/05/13   Care Coordination Outreach Attempts:  An unsuccessful telephone outreach was attempted today to offer the patient information about available care coordination services as a benefit of their health plan.   Follow Up Plan:  Additional outreach attempts will be made to offer the patient care coordination information and services.   Encounter Outcome:  No Answer   Care Coordination Interventions:  No, not indicated    Jone Baseman, RN, MSN [place] [facility] Care Management Coordinator Direct Line [phone]

## 2022-05-06 DIAGNOSIS — Z992 Dependence on renal dialysis: Secondary | ICD-10-CM | POA: Diagnosis not present

## 2022-05-06 DIAGNOSIS — N2581 Secondary hyperparathyroidism of renal origin: Secondary | ICD-10-CM | POA: Diagnosis not present

## 2022-05-06 DIAGNOSIS — N186 End stage renal disease: Secondary | ICD-10-CM | POA: Diagnosis not present

## 2022-05-08 DIAGNOSIS — N041 Nephrotic syndrome with focal and segmental glomerular lesions: Secondary | ICD-10-CM | POA: Diagnosis not present

## 2022-05-08 DIAGNOSIS — N186 End stage renal disease: Secondary | ICD-10-CM | POA: Diagnosis not present

## 2022-05-08 DIAGNOSIS — N2581 Secondary hyperparathyroidism of renal origin: Secondary | ICD-10-CM | POA: Diagnosis not present

## 2022-05-08 DIAGNOSIS — Z992 Dependence on renal dialysis: Secondary | ICD-10-CM | POA: Diagnosis not present

## 2022-05-09 ENCOUNTER — Telehealth: Payer: Self-pay

## 2022-05-09 ENCOUNTER — Other Ambulatory Visit: Payer: Self-pay

## 2022-05-09 DIAGNOSIS — N186 End stage renal disease: Secondary | ICD-10-CM

## 2022-05-09 NOTE — Telephone Encounter (Signed)
Per MD, pt's surgery can be r/s. I have notified pt of his pre op instructions and HD center has been made aware of his surgery date.

## 2022-05-09 NOTE — Progress Notes (Signed)
Orders re entered for new case number.

## 2022-05-10 DIAGNOSIS — Z992 Dependence on renal dialysis: Secondary | ICD-10-CM | POA: Diagnosis not present

## 2022-05-10 DIAGNOSIS — N2581 Secondary hyperparathyroidism of renal origin: Secondary | ICD-10-CM | POA: Diagnosis not present

## 2022-05-10 DIAGNOSIS — N186 End stage renal disease: Secondary | ICD-10-CM | POA: Diagnosis not present

## 2022-05-13 ENCOUNTER — Encounter (HOSPITAL_COMMUNITY): Payer: Self-pay | Admitting: Gastroenterology

## 2022-05-13 DIAGNOSIS — N186 End stage renal disease: Secondary | ICD-10-CM | POA: Diagnosis not present

## 2022-05-13 DIAGNOSIS — N2581 Secondary hyperparathyroidism of renal origin: Secondary | ICD-10-CM | POA: Diagnosis not present

## 2022-05-13 DIAGNOSIS — Z992 Dependence on renal dialysis: Secondary | ICD-10-CM | POA: Diagnosis not present

## 2022-05-13 NOTE — Progress Notes (Signed)
Attempted to obtain medical history via telephone, unable to reach at this time. HIPAA compliant voicemail message left requesting return call to pre surgical testing department. 

## 2022-05-15 DIAGNOSIS — N2581 Secondary hyperparathyroidism of renal origin: Secondary | ICD-10-CM | POA: Diagnosis not present

## 2022-05-15 DIAGNOSIS — N186 End stage renal disease: Secondary | ICD-10-CM | POA: Diagnosis not present

## 2022-05-15 DIAGNOSIS — Z992 Dependence on renal dialysis: Secondary | ICD-10-CM | POA: Diagnosis not present

## 2022-05-17 DIAGNOSIS — N186 End stage renal disease: Secondary | ICD-10-CM | POA: Diagnosis not present

## 2022-05-17 DIAGNOSIS — N2581 Secondary hyperparathyroidism of renal origin: Secondary | ICD-10-CM | POA: Diagnosis not present

## 2022-05-17 DIAGNOSIS — Z992 Dependence on renal dialysis: Secondary | ICD-10-CM | POA: Diagnosis not present

## 2022-05-19 ENCOUNTER — Ambulatory Visit: Payer: Self-pay

## 2022-05-19 DIAGNOSIS — N186 End stage renal disease: Secondary | ICD-10-CM | POA: Diagnosis not present

## 2022-05-19 DIAGNOSIS — Z992 Dependence on renal dialysis: Secondary | ICD-10-CM | POA: Diagnosis not present

## 2022-05-19 DIAGNOSIS — N2581 Secondary hyperparathyroidism of renal origin: Secondary | ICD-10-CM | POA: Diagnosis not present

## 2022-05-19 NOTE — Patient Outreach (Signed)
  Care Coordination   Follow Up Visit Note   05/19/2022 Name: [name] MRN: [id number] DOB: [birth year]  [name] is a 46 y.o. year old male who sees Rudd, Lillette Boxer, MD for primary care. I spoke with  [name] by phone today.  What matters to the patients health and wellness today?  None   Goals Addressed             This Visit's Progress    Health Maintainance       Care Coordination Interventions: Evaluation of current treatment plan related to ESRD management and patient's adherence to plan as established by provider Discussed plans with patient for ongoing care management follow up and provided patient with direct contact information for care management team  Patient rescheduled for Peritoneal dialysis catheter in January.  Patient voices no concerns at this time.            SDOH assessments and interventions completed:  Yes     Care Coordination Interventions:  Yes, provided   Follow up plan: Follow up call scheduled for February    Encounter Outcome:  Pt. Visit Completed   Jone Baseman, RN, MSN [place] [facility] Care Management Coordinator Direct Line [phone]

## 2022-05-19 NOTE — Anesthesia Preprocedure Evaluation (Signed)
Anesthesia Evaluation  Patient identified by MRN, date of birth, ID band Patient awake    Reviewed: Allergy & Precautions, Patient's Chart, lab work & pertinent test results  Airway Mallampati: II  TM Distance: >3 FB Neck ROM: Full    Dental no notable dental hx. (+) Teeth Intact, Dental Advisory Given   Pulmonary sleep apnea , former smoker   Pulmonary exam normal breath sounds clear to auscultation       Cardiovascular hypertension, + Peripheral Vascular Disease  Normal cardiovascular exam(-) dysrhythmias  Rhythm:Regular Rate:Normal     Neuro/Psych negative neurological ROS  negative psych ROS   GI/Hepatic   Endo/Other    Renal/GU Dialysis and ESRFRenal diseaseS/P failed renal Transplant now on Dialysis MWF  K+ 4.6     Musculoskeletal   Abdominal  (+) + obese (BMI 37.5)  Peds  Hematology Lab Results      Component                Value               Date                             HGB                      15.6                03/21/2022                HCT                      46.0                03/21/2022                Anesthesia Other Findings   Reproductive/Obstetrics                             Anesthesia Physical Anesthesia Plan  ASA: 4  Anesthesia Plan: MAC   Post-op Pain Management:    Induction: Intravenous  PONV Risk Score and Plan: 2 and Treatment may vary due to age or medical condition, Ondansetron and TIVA  Airway Management Planned: Nasal Cannula and Natural Airway  Additional Equipment: ClearSight  Intra-op Plan:   Post-operative Plan:   Informed Consent: I have reviewed the patients History and Physical, chart, labs and discussed the procedure including the risks, benefits and alternatives for the proposed anesthesia with the patient or authorized representative who has indicated his/her understanding and acceptance.     Dental advisory given  Plan  Discussed with: CRNA, Anesthesiologist and Surgeon  Anesthesia Plan Comments: (Screening Colonoscopy)        Anesthesia Quick Evaluation

## 2022-05-19 NOTE — Patient Instructions (Signed)
Visit Information  Thank you for taking time to visit with me today. Please don't hesitate to contact me if I can be of assistance to you.   Following are the goals we discussed today:   Goals Addressed             This Visit's Progress    Health Maintainance       Care Coordination Interventions: Evaluation of current treatment plan related to ESRD management and patient's adherence to plan as established by provider Discussed plans with patient for ongoing care management follow up and provided patient with direct contact information for care management team  Patient rescheduled for Peritoneal dialysis catheter in January.  Patient voices no concerns at this time.            Our next appointment is by telephone on 05/21/23 at 1000  Please call the care guide team at [phone] if you need to cancel or reschedule your appointment.   If you are experiencing a Mental Health or [place] or need someone to talk to, please call the Suicide and Crisis Lifeline: 988   Patient verbalizes understanding of instructions and care plan provided today and agrees to view in [place]. Active MyChart status and patient understanding of how to access instructions and care plan via MyChart confirmed with patient.       Jone Baseman, RN, MSN [facility] [facility] Care Management Coordinator Direct Line [phone]

## 2022-05-20 ENCOUNTER — Other Ambulatory Visit: Payer: Self-pay

## 2022-05-20 ENCOUNTER — Encounter (HOSPITAL_COMMUNITY): Payer: Self-pay | Admitting: Gastroenterology

## 2022-05-20 ENCOUNTER — Encounter (HOSPITAL_COMMUNITY)
Admission: RE | Disposition: A | Discharge status: Home / Self Care | Payer: Self-pay | Source: Home / Self Care | Attending: Gastroenterology

## 2022-05-20 ENCOUNTER — Ambulatory Visit (HOSPITAL_COMMUNITY): Payer: Medicare HMO | Admitting: Certified Registered Nurse Anesthetist

## 2022-05-20 ENCOUNTER — Ambulatory Visit (HOSPITAL_BASED_OUTPATIENT_CLINIC_OR_DEPARTMENT_OTHER): Payer: Medicare HMO | Admitting: Certified Registered Nurse Anesthetist

## 2022-05-20 ENCOUNTER — Ambulatory Visit (HOSPITAL_COMMUNITY)
Admission: RE | Admit: 2022-05-20 | Discharge: 2022-05-20 | Disposition: A | Discharge status: Home / Self Care | Payer: Medicare HMO | Attending: Gastroenterology | Admitting: Gastroenterology

## 2022-05-20 DIAGNOSIS — Z1211 Encounter for screening for malignant neoplasm of colon: Secondary | ICD-10-CM

## 2022-05-20 DIAGNOSIS — D127 Benign neoplasm of rectosigmoid junction: Secondary | ICD-10-CM

## 2022-05-20 DIAGNOSIS — I120 Hypertensive chronic kidney disease with stage 5 chronic kidney disease or end stage renal disease: Secondary | ICD-10-CM | POA: Diagnosis not present

## 2022-05-20 DIAGNOSIS — K6389 Other specified diseases of intestine: Secondary | ICD-10-CM

## 2022-05-20 DIAGNOSIS — K648 Other hemorrhoids: Secondary | ICD-10-CM

## 2022-05-20 DIAGNOSIS — K621 Rectal polyp: Secondary | ICD-10-CM

## 2022-05-20 DIAGNOSIS — Z87891 Personal history of nicotine dependence: Secondary | ICD-10-CM | POA: Diagnosis not present

## 2022-05-20 DIAGNOSIS — Z7901 Long term (current) use of anticoagulants: Secondary | ICD-10-CM | POA: Insufficient documentation

## 2022-05-20 DIAGNOSIS — Z992 Dependence on renal dialysis: Secondary | ICD-10-CM | POA: Insufficient documentation

## 2022-05-20 DIAGNOSIS — G4733 Obstructive sleep apnea (adult) (pediatric): Secondary | ICD-10-CM | POA: Insufficient documentation

## 2022-05-20 DIAGNOSIS — D128 Benign neoplasm of rectum: Secondary | ICD-10-CM

## 2022-05-20 DIAGNOSIS — G4730 Sleep apnea, unspecified: Secondary | ICD-10-CM | POA: Diagnosis not present

## 2022-05-20 DIAGNOSIS — I10 Essential (primary) hypertension: Secondary | ICD-10-CM | POA: Diagnosis not present

## 2022-05-20 DIAGNOSIS — N186 End stage renal disease: Secondary | ICD-10-CM | POA: Insufficient documentation

## 2022-05-20 DIAGNOSIS — Z940 Kidney transplant status: Secondary | ICD-10-CM | POA: Insufficient documentation

## 2022-05-20 DIAGNOSIS — I739 Peripheral vascular disease, unspecified: Secondary | ICD-10-CM | POA: Insufficient documentation

## 2022-05-20 DIAGNOSIS — K6289 Other specified diseases of anus and rectum: Secondary | ICD-10-CM | POA: Diagnosis not present

## 2022-05-20 DIAGNOSIS — K640 First degree hemorrhoids: Secondary | ICD-10-CM

## 2022-05-20 HISTORY — PX: POLYPECTOMY: SHX5525

## 2022-05-20 HISTORY — PX: BIOPSY: SHX5522

## 2022-05-20 HISTORY — PX: COLONOSCOPY WITH PROPOFOL: SHX5780

## 2022-05-20 LAB — POCT I-STAT, CHEM 8
BUN: 34 mg/dL — ABNORMAL HIGH (ref 6–20)
Calcium, Ion: 1.11 mmol/L — ABNORMAL LOW (ref 1.15–1.40)
Chloride: 94 mmol/L — ABNORMAL LOW (ref 98–111)
Creatinine, Ser: 11.4 mg/dL — ABNORMAL HIGH (ref 0.61–1.24)
Glucose, Bld: 89 mg/dL (ref 70–99)
HCT: 44 % (ref 39.0–52.0)
Hemoglobin: 15 g/dL (ref 13.0–17.0)
Potassium: 4.6 mmol/L (ref 3.5–5.1)
Sodium: 135 mmol/L (ref 135–145)
TCO2: 28 mmol/L (ref 22–32)

## 2022-05-20 SURGERY — COLONOSCOPY WITH PROPOFOL
Anesthesia: Monitor Anesthesia Care

## 2022-05-20 MED ORDER — VASOPRESSIN 20 UNIT/ML IV SOLN
INTRAVENOUS | Status: DC | PRN
  Administered 2022-05-20: 1 [IU] via INTRAVENOUS
  Administered 2022-05-20 (×2): 2 [IU] via INTRAVENOUS
  Administered 2022-05-20: 1 [IU] via INTRAVENOUS
  Administered 2022-05-20: 2 [IU] via INTRAVENOUS

## 2022-05-20 MED ORDER — VASOPRESSIN 20 UNIT/ML IV SOLN
INTRAVENOUS | Status: AC
  Filled 2022-05-20: qty 1

## 2022-05-20 MED ORDER — SODIUM CHLORIDE 0.9 % IV SOLN: INTRAVENOUS | Status: DC | PRN

## 2022-05-20 MED ORDER — PROPOFOL 500 MG/50ML IV EMUL
INTRAVENOUS | Status: DC | PRN
  Administered 2022-05-20: 25 ug/kg/min via INTRAVENOUS

## 2022-05-20 MED ORDER — MIDAZOLAM HCL 5 MG/5ML IJ SOLN
INTRAMUSCULAR | Status: DC | PRN
  Administered 2022-05-20: .5 mg via INTRAVENOUS
  Administered 2022-05-20: 1.5 mg via INTRAVENOUS

## 2022-05-20 MED ORDER — MIDAZOLAM HCL 2 MG/2ML IJ SOLN
INTRAMUSCULAR | Status: AC
  Filled 2022-05-20: qty 2

## 2022-05-20 MED ORDER — PROPOFOL 10 MG/ML IV BOLUS
INTRAVENOUS | Status: DC | PRN
  Administered 2022-05-20: 10 mg via INTRAVENOUS

## 2022-05-20 MED ORDER — ALBUMIN HUMAN 5 % IV SOLN: INTRAVENOUS | Status: DC | PRN

## 2022-05-20 MED ORDER — PHENYLEPHRINE HCL-NACL 20-0.9 MG/250ML-% IV SOLN
INTRAVENOUS | Status: DC | PRN
  Administered 2022-05-20: 100 ug/min via INTRAVENOUS

## 2022-05-20 MED ORDER — ALBUMIN HUMAN 5 % IV SOLN
INTRAVENOUS | Status: AC
  Filled 2022-05-20: qty 500

## 2022-05-20 SURGICAL SUPPLY — 22 items

## 2022-05-20 NOTE — Anesthesia Procedure Notes (Signed)
Procedure Name: MAC Date/Time: 05/20/2022 8:47 AM  Performed by: West Pugh, CRNAPre-anesthesia Checklist: Patient identified, Emergency Drugs available, Suction available and Patient being monitored Patient Re-evaluated:Patient Re-evaluated prior to induction Oxygen Delivery Method: Simple face mask Preoxygenation: Pre-oxygenation with 100% oxygen Induction Type: IV induction Placement Confirmation: positive ETCO2 and breath sounds checked- equal and bilateral Dental Injury: Teeth and Oropharynx as per pre-operative assessment

## 2022-05-20 NOTE — Interval H&P Note (Signed)
History and Physical Interval Note:  05/20/2022 7:39 AM  [name]  has presented today for surgery, with the diagnosis of colon cancer screening, afib.  The various methods of treatment have been discussed with the patient and family. After consideration of risks, benefits and other options for treatment, the patient has consented to  Procedure(s): COLONOSCOPY WITH PROPOFOL (N/A) as a surgical intervention.  The patient's history has been reviewed, patient examined, no change in status, stable for surgery.  I have reviewed the patient's chart and labs.  Questions were answered to the patient's satisfaction.     Dominic Pea Genise Strack

## 2022-05-20 NOTE — Op Note (Signed)
[facility] Patient Name: [name] Procedure Date: 05/20/2022 MRN: [id number] Attending MD: Gerrit Heck , MD, [id number] Date of Birth: January 16, 1976 CSN: [id number] Age: 46 Admit Type: Outpatient Procedure:                Colonoscopy with biopsies and snare polypectomy Indications:              Screening for colorectal malignant neoplasm, This                            is the patient's first colonoscopy Providers:                Gerrit Heck, MD, Daine Gravel, RN, Cherylynn Ridges, Technician, Christell Faith, CRNA, Andres Shad, MD (Anesthesiologist) Referring MD:              Medicines:                Monitored Anesthesia Care. Due to hypotension prior                            to and during the case, was treated with light                            sedation to include boluses of versed and Propofol,                            along with pressor support. Complications:            No immediate complications. Estimated Blood Loss:     Estimated blood loss was minimal. Procedure:                Pre-Anesthesia Assessment:                           - Prior to the procedure, a History and Physical                            was performed, and patient medications and                            allergies were reviewed. The patient's tolerance of                            previous anesthesia was also reviewed. The risks                            and benefits of the procedure and the sedation                            options and risks were discussed with the patient.  All questions were answered, and informed consent                            was obtained. Prior Anticoagulants: The patient has                            taken Eliquis (apixaban), last dose was 2 days                            prior to procedure. ASA Grade Assessment: III - A                            patient with  severe systemic disease. After                            reviewing the risks and benefits, the patient was                            deemed in satisfactory condition to undergo the                            procedure.                           After obtaining informed consent, the colonoscope                            was passed under direct vision. Throughout the                            procedure, the patient's blood pressure, pulse, and                            oxygen saturations were monitored continuously. The                            CF-HQ190L ([id number]) Olympus colonoscope was                            introduced through the anus and advanced to the the                            cecum, identified by appendiceal orifice and                            ileocecal valve. The colonoscopy was performed                            without difficulty. The patient tolerated the                            procedure well. The quality of the bowel  preparation was good. The ileocecal valve,                            appendiceal orifice, and rectum were photographed. Scope In: 8:57:41 AM Scope Out: 9:20:58 AM Scope Withdrawal Time: 0 hours 17 minutes 50 seconds  Total Procedure Duration: 0 hours 23 minutes 17 seconds  Findings:      The perianal and digital rectal examinations were normal.      Two sessile polyps were found in the rectum and recto-sigmoid colon. The       polyps were 2 to 4 mm in size. These polyps were removed with a cold       snare. Resection and retrieval were complete. Estimated blood loss was       minimal.      Diffuse melanosis was found in the entire colon. Biopsies were taken       with a cold forceps for histology. Estimated blood loss was minimal.      Non-bleeding internal hemorrhoids were found during retroflexion. The       hemorrhoids were small. Impression:               - Two 2 to 4 mm polyps in the rectum and at the                             recto-sigmoid colon, removed with a cold snare.                            Resected and retrieved.                           - Benign appering melanosis noted throughout the                            colon. Biopsied.                           - Non-bleeding internal hemorrhoids. Moderate Sedation:      Not Applicable - Patient had care per Anesthesia. Recommendation:           - Patient has a contact number available for                            emergencies. The signs and symptoms of potential                            delayed complications were discussed with the                            patient. Return to normal activities tomorrow.                            Written discharge instructions were provided to the                            patient.                           -  Resume previous diet.                           - Continue present medications.                           - Resume Eliquis (apixaban) at prior dose tomorrow.                           - Await pathology results.                           - Repeat colonoscopy in 5-10 years for surveillance                            based on pathology results.                           - Return to GI clinic PRN. Procedure Code(s):        --- Professional ---                           [phone], Colonoscopy, flexible; with removal of                            tumor(s), polyp(s), or other lesion(s) by snare                            technique                           45380, 57, Colonoscopy, flexible; with biopsy,                            single or multiple Diagnosis Code(s):        --- Professional ---                           Z12.11, Encounter for screening for malignant                            neoplasm of colon                           D12.8, Benign neoplasm of rectum                           D12.7, Benign neoplasm of rectosigmoid junction                           K63.89, Other specified diseases  of intestine                           K64.8, Other hemorrhoids CPT copyright 2022 American Medical Association. All rights reserved. The codes documented in this report are preliminary and upon coder review may  be revised to meet current compliance requirements. Gerrit Heck, MD 05/20/2022 9:27:59 AM Number of Addenda:  0 

## 2022-05-20 NOTE — Transfer of Care (Signed)
Immediate Anesthesia Transfer of Care Note  Patient: [name]  Procedure(s) Performed: COLONOSCOPY WITH PROPOFOL BIOPSY POLYPECTOMY  Patient Location: PACU  Anesthesia Type:MAC  Level of Consciousness: awake, alert , oriented, and patient cooperative  Airway & Oxygen Therapy: Patient Spontanous Breathing and Patient connected to face mask oxygen  Post-op Assessment: Report given to RN and Post -op Vital signs reviewed and stable  Post vital signs: Reviewed and stable  Last Vitals:  Vitals Value Taken Time  BP 99/68 05/20/22 0940  Temp    Pulse 78 05/20/22 0940  Resp 14 05/20/22 0941  SpO2 94 % 05/20/22 0940  Vitals shown include unvalidated device data.  Last Pain:  Vitals:   05/20/22 0734  TempSrc: Temporal  PainSc: 0-No pain         Complications: No notable events documented.

## 2022-05-20 NOTE — Anesthesia Postprocedure Evaluation (Signed)
Anesthesia Post Note  Patient: [name]  Procedure(s) Performed: COLONOSCOPY WITH PROPOFOL BIOPSY POLYPECTOMY     Patient location during evaluation: Endoscopy Anesthesia Type: MAC Level of consciousness: awake and alert Pain management: pain level controlled Vital Signs Assessment: post-procedure vital signs reviewed and stable Respiratory status: spontaneous breathing, nonlabored ventilation, respiratory function stable and patient connected to nasal cannula oxygen Cardiovascular status: blood pressure returned to baseline and stable Postop Assessment: no apparent nausea or vomiting Anesthetic complications: no  No notable events documented.  Last Vitals:  Vitals:   05/20/22 1030 05/20/22 1039  BP: (!) 94/56 (!) 94/56  Pulse:    Resp: 17   Temp:  (!) 36.4 C  SpO2:  94%    Last Pain:  Vitals:   05/20/22 1039  TempSrc:   PainSc: 0-No pain                 Barnet Glasgow

## 2022-05-20 NOTE — H&P (Signed)
GASTROENTEROLOGY PROCEDURE H&P NOTE   Primary Care Physician: Haydee Salter, MD    Reason for Procedure:  Colon cancer screening  Plan:    Colonoscopy  Patient is appropriate for endoscopic procedure(s) at [facility] Endoscopy unit.  The nature of the procedure, as well as the risks, benefits, and alternatives were carefully and thoroughly reviewed with the patient. Ample time for discussion and questions allowed. The patient understood, was satisfied, and agreed to proceed.     HPI: [name] is a 46 y.o. male who presents for colonoscopy for initial CRC screening.  No previous colonoscopy.  Procedure scheduled at [facility] Endoscopy unit due to elevated periprocedural risks from underlying comorbidities, to include ESRD on HD, atrial fibrillation (on Eliquis), history of occluded IVC with collateralization, OSA.  Has been holding his Eliquis x 2 days for procedure today.  Past Medical History:  Diagnosis Date   Acute respiratory failure with hypoxia ([place]) 11/30/2018   Anemia    ESRD   ESRD on hemodialysis ([place]) 06/07/2011   TTS [place]. Started HD in 2006, got transplant in 2014 lasted until Dec 2019 then went back on HD.  Has L thigh AVG as of Jun 2020.  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    History of blood transfusion    Hypertension    03/19/22- has not had high blood pressure in 5 years.   Morbid obesity ([place])    Paroxysmal atrial fibrillation ([place])    Sepsis ([place]) 11/30/2018   Sleep apnea     Past Surgical History:  Procedure Laterality Date   ARTERIOVENOUS GRAFT PLACEMENT  08/09/2010   Left Thigh Graft by Dr. [name]   AV FISTULA PLACEMENT Right 05/18/2018   Procedure: INSERTION OF ARTERIOVENOUS (AV) GORE-TEX GRAFT Left THIGH;  Surgeon: Waynetta Sandy, MD;  Location: [place];  Service: Vascular;  Laterality: Right;   AV FISTULA PLACEMENT Right 02/15/2021   Procedure: INSERTION OF ARTERIOVENOUS (AV) GORE-TEX LOOP  GRAFT RIGHT THIGH;  Surgeon: Waynetta Sandy, MD;  Location: [place];  Service: Vascular;  Laterality: Right;   CAPD REMOVAL  05/08/2011   Procedure: CONTINUOUS AMBULATORY PERITONEAL DIALYSIS  (CAPD) CATHETER REMOVAL;  Surgeon: [name], MD;  Location: [place];  Service: General;  Laterality: N/A;  Removal of CAPD catheter, Dr. requests to go after [place]  10/05/2010   Right Femoral Cath insertion by Dr. [name].  Pt ahas had several caths inserted.   INSERTION OF DIALYSIS CATHETER Right 02/15/2021   Procedure: Attempted INSERTION OF RIGHT and Left Internal Jugular DIALYSIS CATHETER, Insertion of Left Femoral Vein Dialysis Catheter;  Surgeon: Waynetta Sandy, MD;  Location: [place];  Service: Vascular;  Laterality: Right;   INSERTION OF DIALYSIS CATHETER Right 04/26/2021   Procedure: INSERTION OF TUNNELED 55cm PALIDROME PRECISION CHRONIC DIALYSIS CATHETER;  Surgeon: Cherre Robins, MD;  Location: [facility];  Service: Vascular;  Laterality: Right;   INSERTION OF DIALYSIS CATHETER Left 12/26/2021   Procedure: INSERTION OF TUNNELED  DIALYSIS CATHETER LEFT FEMORAL ARTERY;  Surgeon: Waynetta Sandy, MD;  Location: [place];  Service: Vascular;  Laterality: Left;   INSERTION OF DIALYSIS CATHETER Left 03/06/2022   Procedure: INSERTION OF DIALYSIS CATHETER;  Surgeon: Waynetta Sandy, MD;  Location: [place];  Service: Vascular;  Laterality: Left;   IR FLUORO GUIDE CV LINE LEFT  04/23/2022   IR FLUORO GUIDE CV LINE RIGHT  05/11/2018   IR FLUORO GUIDE CV LINE RIGHT  07/03/2021   IR FLUORO GUIDE CV LINE RIGHT  07/16/2021   IR PTA ADDL CENTRAL DIALYSIS SEG THRU DIALY CIRCUIT RIGHT Right 07/03/2021   IR US GUIDE VASC ACCESS LEFT  04/23/2022   IR US GUIDE VASC ACCESS RIGHT  05/11/2018   IR VENO/EXT/UNI LEFT  04/23/2022   IR VENOCAVAGRAM IVC  07/16/2021   KIDNEY TRANSPLANT  2014   KNEE ARTHROSCOPY Left    THROMBECTOMY W/ EMBOLECTOMY Right  04/26/2021   Procedure: REMOVAL OF RIGHT THIGH ARTERIOVENOUS GORE-TEX GRAFT AND REPAIR OF RIGHT COMMON FEMORAL ARTERY;  Surgeon: Cherre Robins, MD;  Location: [place];  Service: Vascular;  Laterality: Right;   UPPER EXTREMITY ANGIOGRAPHY Bilateral 05/13/2018   Procedure: UPPER EXTREMITY ANGIOGRAPHY - bilarteral;  Surgeon: Marty Heck, MD;  Location: [place] [facility];  Service: Cardiovascular;  Laterality: Bilateral;    Prior to Admission medications   Medication Sig Start Date End Date Taking? Authorizing Provider  apixaban (ELIQUIS) 5 MG TABS tablet Take 5 mg by mouth 2 (two) times daily. 10/10/21  Yes [provider]  atorvastatin (LIPITOR) 40 MG tablet Take 1 tablet (40 mg total) by mouth daily. 12/20/21  Yes Haydee Salter, MD  B Complex-C-Zn-Folic Acid (DIALYVITE/ZINC) TABS Take 1 tablet by mouth daily. 01/16/22  Yes [provider]  Calcium Carb-Cholecalciferol (CALCIUM 600 + D PO) Take 2 tablets by mouth daily.   Yes [provider]  ferric citrate (AURYXIA) 1 GM 210 MG(Fe) tablet Take 210-630 mg by mouth See admin instructions. Take 630 mg with each meal and 210 mg with each snack 09/06/19  Yes [provider]  mupirocin ointment (BACTROBAN) 2 % Apply 1 Application topically 2 (two) times daily as needed (wound care). 01/31/22  Yes [provider]    No current facility-administered medications for this encounter.    Allergies as of 03/14/2022   (No Known Allergies)    Family History  Problem Relation Age of Onset   Diabetes Father    Stroke Father    Stroke Maternal Grandmother    Anesthesia problems Neg Hx     Social History   Socioeconomic History   Marital status: Single    Spouse name: Not on file   Number of children: 0   Years of education: Not on file   Highest education level: Not on file  Occupational History   Not on file  Tobacco Use   Smoking status: Former    Types: Cigarettes    Quit date: 11/08/2015     Years since quitting: 6.5   Smokeless tobacco: Never  Vaping Use   Vaping Use: Never used  Substance and Sexual Activity   Alcohol use: Not Currently   Drug use: Not Currently    Types: Marijuana   Sexual activity: Yes  Other Topics Concern   Not on file  Social History Narrative   Lives alone.    Social Determinants of Health   Financial Resource Strain: Low Risk  (08/14/2021)   Overall Financial Resource Strain (CARDIA)    Difficulty of Paying Living Expenses: Not hard at all  Food Insecurity: No Food Insecurity (08/14/2021)   Hunger Vital Sign    Worried About Running Out of Food in the Last Year: Never true    Ran Out of Food in the Last Year: Never true  Transportation Needs: No Transportation Needs (12/31/2021)   PRAPARE - Transportation    Lack of Transportation (  Medical): No    Lack of Transportation (Non-Medical): No  Physical Activity: Inactive (08/14/2021)   Exercise Vital Sign    Days of Exercise per Week: 0 days    Minutes of Exercise per Session: 0 min  Stress: No Stress Concern Present (08/14/2021)   [place]    Feeling of Stress : Not at all  Social Connections: Socially Isolated (08/14/2021)   Social Connection and Isolation Panel [NHANES]    Frequency of Communication with Friends and Family: Twice a week    Frequency of Social Gatherings with Friends and Family: Twice a week    Attends Religious Services: Never    Marine scientist or Organizations: No    Attends Archivist Meetings: Never    Marital Status: Never married  Intimate Partner Violence: Not At Risk (08/14/2021)   Humiliation, Afraid, Rape, and Kick questionnaire    Fear of Current or Ex-Partner: No    Emotionally Abused: No    Physically Abused: No    Sexually Abused: No    Physical Exam: Vital signs in last 24 hours: '@Pulse'$  (!) 106   Temp (!) 97.3 F (36.3 C) (Temporal)   Resp 18   Ht '6\' 2"'$  (1.88 m)   Wt  132.5 kg   SpO2 100%   BMI 37.50 kg/m  GEN: NAD EYE: Sclerae anicteric ENT: MMM CV: Non-tachycardic Pulm: CTA b/l GI: Soft, NT/ND NEURO:  Alert & Oriented x 3   Gerrit Heck, DO [facility]   05/20/2022 7:37 AM

## 2022-05-21 LAB — SURGICAL PATHOLOGY

## 2022-05-22 DIAGNOSIS — N186 End stage renal disease: Secondary | ICD-10-CM | POA: Diagnosis not present

## 2022-05-22 DIAGNOSIS — Z992 Dependence on renal dialysis: Secondary | ICD-10-CM | POA: Diagnosis not present

## 2022-05-22 DIAGNOSIS — N2581 Secondary hyperparathyroidism of renal origin: Secondary | ICD-10-CM | POA: Diagnosis not present

## 2022-05-24 DIAGNOSIS — N186 End stage renal disease: Secondary | ICD-10-CM | POA: Diagnosis not present

## 2022-05-24 DIAGNOSIS — Z992 Dependence on renal dialysis: Secondary | ICD-10-CM | POA: Diagnosis not present

## 2022-05-24 DIAGNOSIS — N2581 Secondary hyperparathyroidism of renal origin: Secondary | ICD-10-CM | POA: Diagnosis not present

## 2022-05-25 ENCOUNTER — Encounter (HOSPITAL_COMMUNITY): Payer: Self-pay | Admitting: Gastroenterology

## 2022-05-27 DIAGNOSIS — N186 End stage renal disease: Secondary | ICD-10-CM | POA: Diagnosis not present

## 2022-05-27 DIAGNOSIS — N2581 Secondary hyperparathyroidism of renal origin: Secondary | ICD-10-CM | POA: Diagnosis not present

## 2022-05-27 DIAGNOSIS — Z992 Dependence on renal dialysis: Secondary | ICD-10-CM | POA: Diagnosis not present

## 2022-05-29 DIAGNOSIS — N186 End stage renal disease: Secondary | ICD-10-CM | POA: Diagnosis not present

## 2022-05-29 DIAGNOSIS — Z992 Dependence on renal dialysis: Secondary | ICD-10-CM | POA: Diagnosis not present

## 2022-05-29 DIAGNOSIS — N2581 Secondary hyperparathyroidism of renal origin: Secondary | ICD-10-CM | POA: Diagnosis not present

## 2022-05-31 DIAGNOSIS — N186 End stage renal disease: Secondary | ICD-10-CM | POA: Diagnosis not present

## 2022-05-31 DIAGNOSIS — Z992 Dependence on renal dialysis: Secondary | ICD-10-CM | POA: Diagnosis not present

## 2022-05-31 DIAGNOSIS — N2581 Secondary hyperparathyroidism of renal origin: Secondary | ICD-10-CM | POA: Diagnosis not present

## 2022-06-03 DIAGNOSIS — N2581 Secondary hyperparathyroidism of renal origin: Secondary | ICD-10-CM | POA: Diagnosis not present

## 2022-06-03 DIAGNOSIS — Z992 Dependence on renal dialysis: Secondary | ICD-10-CM | POA: Diagnosis not present

## 2022-06-03 DIAGNOSIS — N186 End stage renal disease: Secondary | ICD-10-CM | POA: Diagnosis not present

## 2022-06-05 DIAGNOSIS — Z992 Dependence on renal dialysis: Secondary | ICD-10-CM | POA: Diagnosis not present

## 2022-06-05 DIAGNOSIS — N186 End stage renal disease: Secondary | ICD-10-CM | POA: Diagnosis not present

## 2022-06-05 DIAGNOSIS — T8249XA Other complication of vascular dialysis catheter, initial encounter: Secondary | ICD-10-CM | POA: Diagnosis not present

## 2022-06-06 DIAGNOSIS — N186 End stage renal disease: Secondary | ICD-10-CM | POA: Diagnosis not present

## 2022-06-06 DIAGNOSIS — N2581 Secondary hyperparathyroidism of renal origin: Secondary | ICD-10-CM | POA: Diagnosis not present

## 2022-06-06 DIAGNOSIS — Z992 Dependence on renal dialysis: Secondary | ICD-10-CM | POA: Diagnosis not present

## 2022-06-07 DIAGNOSIS — Z992 Dependence on renal dialysis: Secondary | ICD-10-CM | POA: Diagnosis not present

## 2022-06-07 DIAGNOSIS — N186 End stage renal disease: Secondary | ICD-10-CM | POA: Diagnosis not present

## 2022-06-07 DIAGNOSIS — N2581 Secondary hyperparathyroidism of renal origin: Secondary | ICD-10-CM | POA: Diagnosis not present

## 2022-06-08 DIAGNOSIS — N186 End stage renal disease: Secondary | ICD-10-CM | POA: Diagnosis not present

## 2022-06-08 DIAGNOSIS — Z992 Dependence on renal dialysis: Secondary | ICD-10-CM | POA: Diagnosis not present

## 2022-06-08 DIAGNOSIS — N041 Nephrotic syndrome with focal and segmental glomerular lesions: Secondary | ICD-10-CM | POA: Diagnosis not present

## 2022-06-10 DIAGNOSIS — N186 End stage renal disease: Secondary | ICD-10-CM | POA: Diagnosis not present

## 2022-06-10 DIAGNOSIS — N2581 Secondary hyperparathyroidism of renal origin: Secondary | ICD-10-CM | POA: Diagnosis not present

## 2022-06-10 DIAGNOSIS — Z992 Dependence on renal dialysis: Secondary | ICD-10-CM | POA: Diagnosis not present

## 2022-06-12 ENCOUNTER — Encounter (HOSPITAL_COMMUNITY): Payer: Self-pay | Admitting: Vascular Surgery

## 2022-06-12 DIAGNOSIS — N186 End stage renal disease: Secondary | ICD-10-CM | POA: Diagnosis not present

## 2022-06-12 DIAGNOSIS — Z992 Dependence on renal dialysis: Secondary | ICD-10-CM | POA: Diagnosis not present

## 2022-06-12 DIAGNOSIS — N2581 Secondary hyperparathyroidism of renal origin: Secondary | ICD-10-CM | POA: Diagnosis not present

## 2022-06-12 NOTE — Progress Notes (Signed)
PCP - Dr Arlester Marker Cardiologist - Dr Dani Gobble Croitoru  Chest x-ray - n/a EKG - 12/25/21 Stress Test - 02/21/22 ECHO -12/01/18 Cardiac Cath - n/a  ICD Pacemaker/Loop - n/a  Sleep Study -  Yes CPAP - does not use CPAP, lost weight  Blood Thinner Instructions:  Follow your surgeon's instructions on when to stop Eliquis prior to surgery.  Last dose was on 06/11/22.  Anesthesia review: Yes  STOP now taking any Aspirin (unless otherwise instructed by your surgeon), Aleve, Naproxen, Ibuprofen, Motrin, Advil, Goody's, BC's, all herbal medications, fish oil, and all vitamins.   Coronavirus Screening Do you have any of the following symptoms:  Cough yes/no: No Fever (>100.47F)  yes/no: No Runny nose yes/no: No Sore throat yes/no: No Difficulty breathing/shortness of breath  yes/no: No  Have you traveled in the last 14 days and where? yes/no: No  Patient verbalized understanding of instructions that were given via phone.

## 2022-06-13 ENCOUNTER — Other Ambulatory Visit: Payer: Self-pay

## 2022-06-13 ENCOUNTER — Encounter (HOSPITAL_COMMUNITY)
Admission: RE | Disposition: A | Discharge status: Home / Self Care | Payer: Self-pay | Source: Ambulatory Visit | Attending: Vascular Surgery

## 2022-06-13 ENCOUNTER — Ambulatory Visit (HOSPITAL_COMMUNITY)
Admission: RE | Admit: 2022-06-13 | Discharge: 2022-06-13 | Disposition: A | Discharge status: Home / Self Care | Payer: Medicare HMO | Source: Ambulatory Visit | Attending: Vascular Surgery | Admitting: Vascular Surgery

## 2022-06-13 ENCOUNTER — Ambulatory Visit (HOSPITAL_COMMUNITY): Payer: Medicare HMO | Admitting: Physician Assistant

## 2022-06-13 ENCOUNTER — Encounter (HOSPITAL_COMMUNITY): Payer: Self-pay | Admitting: Vascular Surgery

## 2022-06-13 ENCOUNTER — Ambulatory Visit (HOSPITAL_BASED_OUTPATIENT_CLINIC_OR_DEPARTMENT_OTHER): Payer: Medicare HMO | Admitting: Physician Assistant

## 2022-06-13 DIAGNOSIS — I132 Hypertensive heart and chronic kidney disease with heart failure and with stage 5 chronic kidney disease, or end stage renal disease: Secondary | ICD-10-CM | POA: Insufficient documentation

## 2022-06-13 DIAGNOSIS — M1990 Unspecified osteoarthritis, unspecified site: Secondary | ICD-10-CM | POA: Diagnosis not present

## 2022-06-13 DIAGNOSIS — Z7901 Long term (current) use of anticoagulants: Secondary | ICD-10-CM | POA: Diagnosis not present

## 2022-06-13 DIAGNOSIS — G4730 Sleep apnea, unspecified: Secondary | ICD-10-CM | POA: Insufficient documentation

## 2022-06-13 DIAGNOSIS — I120 Hypertensive chronic kidney disease with stage 5 chronic kidney disease or end stage renal disease: Secondary | ICD-10-CM | POA: Diagnosis present

## 2022-06-13 DIAGNOSIS — Z87891 Personal history of nicotine dependence: Secondary | ICD-10-CM | POA: Diagnosis not present

## 2022-06-13 DIAGNOSIS — Z992 Dependence on renal dialysis: Secondary | ICD-10-CM | POA: Insufficient documentation

## 2022-06-13 DIAGNOSIS — N186 End stage renal disease: Secondary | ICD-10-CM | POA: Diagnosis not present

## 2022-06-13 DIAGNOSIS — I480 Paroxysmal atrial fibrillation: Secondary | ICD-10-CM | POA: Insufficient documentation

## 2022-06-13 DIAGNOSIS — Z940 Kidney transplant status: Secondary | ICD-10-CM | POA: Insufficient documentation

## 2022-06-13 DIAGNOSIS — Z6838 Body mass index (BMI) 38.0-38.9, adult: Secondary | ICD-10-CM | POA: Insufficient documentation

## 2022-06-13 DIAGNOSIS — K660 Peritoneal adhesions (postprocedural) (postinfection): Secondary | ICD-10-CM | POA: Insufficient documentation

## 2022-06-13 DIAGNOSIS — D631 Anemia in chronic kidney disease: Secondary | ICD-10-CM | POA: Diagnosis not present

## 2022-06-13 DIAGNOSIS — I509 Heart failure, unspecified: Secondary | ICD-10-CM | POA: Insufficient documentation

## 2022-06-13 DIAGNOSIS — N185 Chronic kidney disease, stage 5: Secondary | ICD-10-CM | POA: Diagnosis not present

## 2022-06-13 HISTORY — PX: CAPD INSERTION: SHX5233

## 2022-06-13 HISTORY — DX: Prediabetes: R73.03

## 2022-06-13 HISTORY — PX: LAPAROSCOPIC LYSIS OF ADHESIONS: SHX5905

## 2022-06-13 LAB — POCT I-STAT, CHEM 8
BUN: 86 mg/dL — ABNORMAL HIGH (ref 6–20)
Calcium, Ion: 0.95 mmol/L — ABNORMAL LOW (ref 1.15–1.40)
Chloride: 103 mmol/L (ref 98–111)
Creatinine, Ser: 16.9 mg/dL — ABNORMAL HIGH (ref 0.61–1.24)
Glucose, Bld: 88 mg/dL (ref 70–99)
HCT: 43 % (ref 39.0–52.0)
Hemoglobin: 14.6 g/dL (ref 13.0–17.0)
Potassium: 7.2 mmol/L (ref 3.5–5.1)
Sodium: 135 mmol/L (ref 135–145)
TCO2: 24 mmol/L (ref 22–32)

## 2022-06-13 LAB — POTASSIUM: Potassium: 5 mmol/L (ref 3.5–5.1)

## 2022-06-13 SURGERY — LAPAROSCOPIC INSERTION CONTINUOUS AMBULATORY PERITONEAL DIALYSIS  (CAPD) CATHETER
Anesthesia: General | Site: Abdomen

## 2022-06-13 MED ORDER — MIDAZOLAM HCL 2 MG/2ML IJ SOLN
INTRAMUSCULAR | Status: AC
  Filled 2022-06-13: qty 2

## 2022-06-13 MED ORDER — PROPOFOL 10 MG/ML IV BOLUS
INTRAVENOUS | Status: DC | PRN
  Administered 2022-06-13: 200 mg via INTRAVENOUS

## 2022-06-13 MED ORDER — DEXAMETHASONE SODIUM PHOSPHATE 10 MG/ML IJ SOLN
INTRAMUSCULAR | Status: DC | PRN
  Administered 2022-06-13: 10 mg via INTRAVENOUS

## 2022-06-13 MED ORDER — LIDOCAINE 2% (20 MG/ML) 5 ML SYRINGE
INTRAMUSCULAR | Status: DC | PRN
  Administered 2022-06-13: 60 mg via INTRAVENOUS

## 2022-06-13 MED ORDER — SODIUM CHLORIDE 0.9 % IR SOLN
Status: DC | PRN
  Administered 2022-06-13: 1000 mL
  Administered 2022-06-13: 1

## 2022-06-13 MED ORDER — MIDAZOLAM HCL 2 MG/2ML IJ SOLN
INTRAMUSCULAR | Status: DC | PRN
  Administered 2022-06-13: 2 mg via INTRAVENOUS

## 2022-06-13 MED ORDER — FENTANYL CITRATE (PF) 250 MCG/5ML IJ SOLN
INTRAMUSCULAR | Status: AC
  Filled 2022-06-13: qty 5

## 2022-06-13 MED ORDER — ACETAMINOPHEN 10 MG/ML IV SOLN
1000.0000 mg | Freq: Once | INTRAVENOUS | Status: DC | PRN
  Administered 2022-06-13: 1000 mg via INTRAVENOUS

## 2022-06-13 MED ORDER — ACETAMINOPHEN 10 MG/ML IV SOLN
INTRAVENOUS | Status: AC
  Filled 2022-06-13: qty 100

## 2022-06-13 MED ORDER — CEFAZOLIN IN SODIUM CHLORIDE 3-0.9 GM/100ML-% IV SOLN
3.0000 g | INTRAVENOUS | Status: AC
  Administered 2022-06-13: 3 g via INTRAVENOUS
  Filled 2022-06-13: qty 100

## 2022-06-13 MED ORDER — ONDANSETRON HCL 4 MG/2ML IJ SOLN
INTRAMUSCULAR | Status: DC | PRN
  Administered 2022-06-13: 4 mg via INTRAVENOUS

## 2022-06-13 MED ORDER — 0.9 % SODIUM CHLORIDE (POUR BTL) OPTIME
TOPICAL | Status: DC | PRN
  Administered 2022-06-13: 1000 mL

## 2022-06-13 MED ORDER — OXYCODONE-ACETAMINOPHEN 5-325 MG PO TABS: 1.0000 | ORAL_TABLET | Freq: Four times a day (QID) | ORAL | 0 refills | Status: DC | PRN

## 2022-06-13 MED ORDER — VASOPRESSIN 20 UNIT/ML IV SOLN
INTRAVENOUS | Status: DC | PRN
  Administered 2022-06-13 (×2): 1 [IU] via INTRAVENOUS

## 2022-06-13 MED ORDER — ORAL CARE MOUTH RINSE: 15.0000 mL | Freq: Once | OROMUCOSAL | Status: AC

## 2022-06-13 MED ORDER — ROCURONIUM BROMIDE 10 MG/ML (PF) SYRINGE
PREFILLED_SYRINGE | INTRAVENOUS | Status: DC | PRN
  Administered 2022-06-13 (×2): 50 mg via INTRAVENOUS

## 2022-06-13 MED ORDER — VASOPRESSIN 20 UNIT/ML IV SOLN
INTRAVENOUS | Status: AC
  Filled 2022-06-13: qty 1

## 2022-06-13 MED ORDER — BUPIVACAINE HCL (PF) 0.5 % IJ SOLN
INTRAMUSCULAR | Status: AC
  Filled 2022-06-13: qty 30

## 2022-06-13 MED ORDER — SUGAMMADEX SODIUM 200 MG/2ML IV SOLN
INTRAVENOUS | Status: DC | PRN
  Administered 2022-06-13: 200 mg via INTRAVENOUS

## 2022-06-13 MED ORDER — HEPARIN 6000 UNIT IRRIGATION SOLUTION
Status: AC
  Filled 2022-06-13: qty 500

## 2022-06-13 MED ORDER — CHLORHEXIDINE GLUCONATE 4 % EX LIQD: 60.0000 mL | Freq: Once | CUTANEOUS | Status: DC

## 2022-06-13 MED ORDER — FENTANYL CITRATE (PF) 100 MCG/2ML IJ SOLN
25.0000 ug | INTRAMUSCULAR | Status: DC | PRN
  Administered 2022-06-13 (×2): 25 ug via INTRAVENOUS
  Administered 2022-06-13: 50 ug via INTRAVENOUS

## 2022-06-13 MED ORDER — PHENYLEPHRINE HCL-NACL 20-0.9 MG/250ML-% IV SOLN
INTRAVENOUS | Status: DC | PRN
  Administered 2022-06-13: 80 ug/min via INTRAVENOUS

## 2022-06-13 MED ORDER — FENTANYL CITRATE (PF) 250 MCG/5ML IJ SOLN
INTRAMUSCULAR | Status: DC | PRN
  Administered 2022-06-13: 100 ug via INTRAVENOUS
  Administered 2022-06-13 (×2): 50 ug via INTRAVENOUS

## 2022-06-13 MED ORDER — CHLORHEXIDINE GLUCONATE 0.12 % MT SOLN
15.0000 mL | Freq: Once | OROMUCOSAL | Status: AC
  Administered 2022-06-13: 15 mL via OROMUCOSAL
  Filled 2022-06-13: qty 15

## 2022-06-13 MED ORDER — HEPARIN 6000 UNIT IRRIGATION SOLUTION
Status: DC | PRN
  Administered 2022-06-13: 1

## 2022-06-13 MED ORDER — FENTANYL CITRATE (PF) 100 MCG/2ML IJ SOLN
INTRAMUSCULAR | Status: AC
  Filled 2022-06-13: qty 2

## 2022-06-13 MED ORDER — BUPIVACAINE-EPINEPHRINE 0.5% -1:200000 IJ SOLN
INTRAMUSCULAR | Status: DC | PRN
  Administered 2022-06-13: 5 mL

## 2022-06-13 MED ORDER — BUPIVACAINE-EPINEPHRINE (PF) 0.5% -1:200000 IJ SOLN
INTRAMUSCULAR | Status: AC
  Filled 2022-06-13: qty 30

## 2022-06-13 MED ORDER — SODIUM CHLORIDE 0.9 % IV SOLN: INTRAVENOUS | Status: DC

## 2022-06-13 MED ORDER — PHENYLEPHRINE 80 MCG/ML (10ML) SYRINGE FOR IV PUSH (FOR BLOOD PRESSURE SUPPORT)
PREFILLED_SYRINGE | INTRAVENOUS | Status: DC | PRN
  Administered 2022-06-13 (×2): 240 ug via INTRAVENOUS

## 2022-06-13 SURGICAL SUPPLY — 52 items
ADAPTER TITANIUM MEDIONICS (MISCELLANEOUS) ×3 IMPLANT
ADH SKN CLS APL DERMABOND .7 (GAUZE/BANDAGES/DRESSINGS) ×2
ADPR DLYS CATH STRL LF DISP (MISCELLANEOUS) ×2
APL PRP STRL LF DISP 70% ISPRP (MISCELLANEOUS) ×2
BIOPATCH RED 1 DISK 7.0 (GAUZE/BANDAGES/DRESSINGS) ×1 IMPLANT
BLADE CLIPPER SURG (BLADE) ×1 IMPLANT
BLADE SURG 11 STRL SS (BLADE) ×2 IMPLANT
CATH EXTENDED DIALYSIS (CATHETERS) IMPLANT
CHLORAPREP W/TINT 26 (MISCELLANEOUS) ×3 IMPLANT
COVER SURGICAL LIGHT HANDLE (MISCELLANEOUS) ×3 IMPLANT
DERMABOND ADVANCED .7 DNX12 (GAUZE/BANDAGES/DRESSINGS) ×3 IMPLANT
DEVICE TROCAR PUNCTURE CLOSURE (ENDOMECHANICALS) ×3 IMPLANT
DRSG TEGADERM 4X4.75 (GAUZE/BANDAGES/DRESSINGS) ×9 IMPLANT
ELECT REM PT RETURN 9FT ADLT (ELECTROSURGICAL) ×2
ELECTRODE REM PT RTRN 9FT ADLT (ELECTROSURGICAL) ×3 IMPLANT
GAUZE SPONGE 4X4 12PLY STRL (GAUZE/BANDAGES/DRESSINGS) ×1 IMPLANT
GLOVE INDICATOR 6.5 STRL GRN (GLOVE) ×3 IMPLANT
GLOVE SURG UNDER LTX SZ7.5 (GLOVE) ×3 IMPLANT
GOWN STRL REUS W/ TWL LRG LVL3 (GOWN DISPOSABLE) ×6 IMPLANT
GOWN STRL REUS W/ TWL XL LVL3 (GOWN DISPOSABLE) ×3 IMPLANT
GOWN STRL REUS W/TWL LRG LVL3 (GOWN DISPOSABLE) ×4
GOWN STRL REUS W/TWL XL LVL3 (GOWN DISPOSABLE) ×2
GRASPER SUT TROCAR 14GX15 (MISCELLANEOUS) ×2 IMPLANT
IRRIG SUCT STRYKERFLOW 2 WTIP (MISCELLANEOUS) ×2
IRRIGATION SUCT STRKRFLW 2 WTP (MISCELLANEOUS) ×1 IMPLANT
IV NS 1000ML (IV SOLUTION) ×4
IV NS 1000ML BAXH (IV SOLUTION) ×4 IMPLANT
KIT BASIN OR (CUSTOM PROCEDURE TRAY) ×3 IMPLANT
KIT TURNOVER KIT B (KITS) ×3 IMPLANT
NDL INSUFFLATION 14GA 120MM (NEEDLE) ×2 IMPLANT
NEEDLE INSUFFLATION 14GA 120MM (NEEDLE) ×2 IMPLANT
NS IRRIG 1000ML POUR BTL (IV SOLUTION) ×3 IMPLANT
PAD ARMBOARD 7.5X6 YLW CONV (MISCELLANEOUS) ×6 IMPLANT
SCISSORS LAP 5X35 DISP (ENDOMECHANICALS) ×1 IMPLANT
SET CYSTO W/LG BORE CLAMP LF (SET/KITS/TRAYS/PACK) ×3 IMPLANT
SET EXT 12IN DIALYSIS STAY-SAF (MISCELLANEOUS) ×3 IMPLANT
SET TUBE SMOKE EVAC HIGH FLOW (TUBING) ×3 IMPLANT
SLEEVE Z-THREAD 5X100MM (TROCAR) ×2 IMPLANT
STYLET FALLER (MISCELLANEOUS) ×3 IMPLANT
STYLET FALLER MEDIONICS (MISCELLANEOUS) ×3 IMPLANT
SUT MNCRL AB 4-0 PS2 18 (SUTURE) ×4 IMPLANT
SUT PROLENE 0 SH 30 (SUTURE) ×6 IMPLANT
SYR BULB IRRIG 60ML STRL (SYRINGE) ×1 IMPLANT
TAPE CLOTH SURG 6X10 WHT LF (GAUZE/BANDAGES/DRESSINGS) ×1 IMPLANT
TOWEL GREEN STERILE (TOWEL DISPOSABLE) ×3 IMPLANT
TOWEL GREEN STERILE FF (TOWEL DISPOSABLE) ×3 IMPLANT
TRAY LAPAROSCOPIC MC (CUSTOM PROCEDURE TRAY) ×3 IMPLANT
TROCAR 11X100 Z THREAD (TROCAR) IMPLANT
TROCAR 5MMX150MM (TROCAR) ×1 IMPLANT
TROCAR XCEL NON-BLD 5MMX100MML (ENDOMECHANICALS) ×1 IMPLANT
TROCAR Z-THREAD OPTICAL 5X100M (TROCAR) ×1 IMPLANT
WATER STERILE IRR 1000ML POUR (IV SOLUTION) ×3 IMPLANT

## 2022-06-13 NOTE — Anesthesia Procedure Notes (Signed)
Procedure Name: Intubation Date/Time: 06/13/2022 9:50 AM  Performed by: Minerva Ends, CRNAPre-anesthesia Checklist: Patient identified, Emergency Drugs available, Suction available and Patient being monitored Patient Re-evaluated:Patient Re-evaluated prior to induction Oxygen Delivery Method: Circle system utilized Preoxygenation: Pre-oxygenation with 100% oxygen Induction Type: IV induction Ventilation: Mask ventilation without difficulty Laryngoscope Size: Mac and 3 Grade View: Grade II Tube type: Oral Tube size: 7.0 mm Number of attempts: 1 Airway Equipment and Method: Stylet and Oral airway Placement Confirmation: ETT inserted through vocal cords under direct vision, positive ETCO2 and breath sounds checked- equal and bilateral Secured at: 22 cm Tube secured with: Tape Dental Injury: Teeth and Oropharynx as per pre-operative assessment

## 2022-06-13 NOTE — H&P (Signed)
HPI:   ALECXANDER [name] is a 47 y.o. male has a history of end-stage renal disease currently on dialysis via left femoral tunneled dialysis catheter which terminates in the infrarenal IVC.  Catheter continues to work.  He is on Eliquis for previous catheter that thrombosed multiple times.  He is now here to discuss further options including peritoneal dialysis.  He was previously on peritoneal dialysis 10 years ago catheter was removed from transplant was placed.  The transplant and other peritoneal dialysis catheter he does not have any other abdominal surgeries.  Currently on dialysis Tuesdays, Thursdays and Saturdays.          Past Medical History:  Diagnosis Date   Acute respiratory failure with hypoxia ([place]) 11/30/2018   Anemia      ESRD   ESRD on hemodialysis ([place]) 06/07/2011    TTS [place] [facility]. Started HD in 2006, got transplant in 2014 lasted until Dec 2019 then went back on HD.  Has L thigh AVG as of Jun 2020.  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    Hypertension     Morbid obesity ([place])     Paroxysmal atrial fibrillation ([place])     Sepsis ([place]) 11/30/2018   Sleep apnea     Type 2 diabetes mellitus with other diabetic kidney complication ([place]) 76/16/0737             Family History  Problem Relation Age of Onset   Diabetes Father     Stroke Father     Stroke Maternal Grandmother     Anesthesia problems Neg Hx               Past Surgical History:  Procedure Laterality Date   ARTERIOVENOUS GRAFT PLACEMENT   08/09/2010    Left Thigh Graft by Dr. [name]   AV FISTULA PLACEMENT Right 05/18/2018    Procedure: INSERTION OF ARTERIOVENOUS (AV) GORE-TEX GRAFT Left THIGH;  Surgeon: Waynetta Sandy, MD;  Location: [facility];  Service: Vascular;  Laterality: Right;   AV FISTULA PLACEMENT Right 02/15/2021    Procedure: INSERTION OF ARTERIOVENOUS (AV) GORE-TEX LOOP GRAFT RIGHT THIGH;  Surgeon: Waynetta Sandy, MD;  Location: [facility];  Service: Vascular;   Laterality: Right;   CAPD REMOVAL   05/08/2011    Procedure: CONTINUOUS AMBULATORY PERITONEAL DIALYSIS  (CAPD) CATHETER REMOVAL;  Surgeon: [name], MD;  Location: [facility];  Service: General;  Laterality: N/A;  Removal of CAPD catheter, Dr. requests to go after 100   INSERTION OF DIALYSIS CATHETER   10/05/2010    Right Femoral Cath insertion by Dr. [name].  Pt ahas had several caths inserted.   INSERTION OF DIALYSIS CATHETER Right 02/15/2021    Procedure: Attempted INSERTION OF RIGHT and Left Internal Jugular DIALYSIS CATHETER, Insertion of Left Femoral Vein Dialysis Catheter;  Surgeon: Waynetta Sandy, MD;  Location: [facility];  Service: Vascular;  Laterality: Right;   INSERTION OF DIALYSIS CATHETER Right 04/26/2021    Procedure: INSERTION OF TUNNELED 55cm PALIDROME PRECISION CHRONIC DIALYSIS CATHETER;  Surgeon: Cherre Robins, MD;  Location: [facility];  Service: Vascular;  Laterality: Right;   INSERTION OF DIALYSIS CATHETER Left 12/26/2021    Procedure: INSERTION OF TUNNELED  DIALYSIS CATHETER LEFT FEMORAL ARTERY;  Surgeon: Waynetta Sandy, MD;  Location: [facility];  Service: Vascular;  Laterality: Left;   IR FLUORO GUIDE CV LINE RIGHT   05/11/2018   IR FLUORO GUIDE CV LINE RIGHT  07/03/2021   IR FLUORO GUIDE CV LINE RIGHT   07/16/2021   IR PTA ADDL CENTRAL DIALYSIS SEG THRU DIALY CIRCUIT RIGHT Right 07/03/2021   IR US GUIDE VASC ACCESS RIGHT   05/11/2018   IR VENOCAVAGRAM IVC   07/16/2021   KIDNEY TRANSPLANT   2014   KNEE ARTHROSCOPY Left     THROMBECTOMY W/ EMBOLECTOMY Right 04/26/2021    Procedure: REMOVAL OF RIGHT THIGH ARTERIOVENOUS GORE-TEX GRAFT AND REPAIR OF RIGHT COMMON FEMORAL ARTERY;  Surgeon: Cherre Robins, MD;  Location: [facility];  Service: Vascular;  Laterality: Right;   UPPER EXTREMITY ANGIOGRAPHY Bilateral 05/13/2018    Procedure: UPPER EXTREMITY ANGIOGRAPHY - bilarteral;  Surgeon: Marty Heck, MD;  Location: [facility];  Service:  Cardiovascular;  Laterality: Bilateral;      Short Social History:  Social History             Tobacco Use   Smoking status: Former      Types: Cigarettes      Quit date: 11/08/2015      Years since quitting: 6.2   Smokeless tobacco: Never   Tobacco comments:      Smokes a cigarette about every 3-4 days.  Substance Use Topics   Alcohol use: Yes      Comment: Socially      No Known Allergies              Current Outpatient Medications  Medication Sig Dispense Refill   apixaban (ELIQUIS) 5 MG TABS tablet Take 5 mg by mouth 2 (two) times daily.       atorvastatin (LIPITOR) 40 MG tablet Take 1 tablet (40 mg total) by mouth daily. 90 tablet 3   B Complex-C-Zn-Folic Acid (DIALYVITE/ZINC) TABS         ferric citrate (AURYXIA) 1 GM 210 MG(Fe) tablet Take 630 mg by mouth 3 (three) times daily with meals.        No current facility-administered medications for this visit.      Review of Systems  Constitutional:  Constitutional negative. HENT: HENT negative.  Eyes: Eyes negative.  Respiratory: Respiratory negative.  Cardiovascular: Cardiovascular negative.  GI: Gastrointestinal negative.  Musculoskeletal: Musculoskeletal negative.  Skin: Skin negative.  Neurological: Neurological negative. Hematologic: Hematologic/lymphatic negative.  Psychiatric: Psychiatric negative.          Objective:     Vitals:   06/13/22 0743 06/13/22 0754  BP: (!) 75/46 (!) 70/53  Pulse:    Resp:    Temp:    SpO2:        Physical Exam HENT:     Nose: Nose normal.  Eyes:     Pupils: Pupils are equal, round, and reactive to light.  Pulmonary:     Effort: Pulmonary effort is normal.  Abdominal:     General: Abdomen is flat.     Palpations: Abdomen is soft.  Neurological:     General: No focal deficit present.     Mental Status: He is alert.           Assessment/Plan:    47 year old male currently the left femoral tunneled dialysis catheter that unfortunately terminates in his  IVC and then the IVC is occluded above this and we will consider this at his last access option for hemodialysis other than lumbar access to be placed possibly by interventional radiology.  He is now here to discuss peritoneal dialysis.  He does have a previous catheter as well as renal transplant catheter was  removed when the transplant was placed.  He does not have any other abdominal surgeries.  We have discussed proceeding with continuous ambulatory peritoneal dialysis catheter placement and also discussed with him the high risk nature of this including the risk for injury to surrounding structures in his abdomen as well as the risk of malfunction of the catheter.  He demonstrates good understanding and will plan for capd today in OR so long as he tolerates general anesthesia.       Autry Droege C. Donzetta Matters, MD [facility] Office: [phone] Pager: [phone]

## 2022-06-13 NOTE — Discharge Instructions (Signed)
Peritoneal Dialysis Catheter Placement, Care After The following information offers guidance on how to care for yourself after your procedure. Your health care provider may also give you more specific instructions. If you have problems or questions, contact your health care provider. What can I expect after the procedure? After the procedure, it is common to have some pain or discomfort in your abdomen and your incision area. You may need to wait 2 weeks after your procedure before you can start peritoneal dialysis treatment. If you need dialysis before that time, your health care provider may begin peritoneal dialysis treatment early or offer kidney dialysis treatments (hemodialysis) until you heal. Follow these instructions at home: Incision care  Follow instructions from your health care provider about how to take care of your incision or incisions. Make sure you: Wash your hands with soap and water for at least 20 seconds before and after you change your bandage (dressing). If soap and water are not available, use hand sanitizer. Change your dressing only as told by your health care provider. Your health care provider may tell you not to touch or change your dressing. Leave stitches (sutures), staples, skin glue, or adhesive strips in place. These skin closures may need to stay in place for 2 weeks or longer. If adhesive strip edges start to loosen and curl up, you may trim the loose edges. Do not remove adhesive strips completely unless your health care provider tells you to do that. Check your incision areas every day for signs of infection. If you were instructed not to touch or change your dressing, look at your dressing for signs of infection. Check for: Redness, swelling, or more pain. Fluid or blood. Warmth. Pus or a bad smell. Medicines Take over-the-counter and prescription medicines only as told by your health care provider. If you were prescribed an antibiotic medicine, use it as  told by your health care provider. Do not stop using the antibiotic even if you start to feel better. Ask your health care provider if the medicine prescribed to you requires you to avoid driving or using machinery. Driving Do not drive or ride in a car until your health care provider approves. Your seat belt could move the catheter out of position or cause irritation by rubbing on your incision. Activity  Rest and limit your activity. Do not lift anything that is heavier than 10 lb (4.5 kg), or the limit that you are told, until your health care provider says that it is safe. Return to your normal activities as told by your health care provider. Ask your health care provider what activities are safe for you. Managing constipation Your condition may cause constipation. To prevent or treat constipation, you may need to: Drink enough fluid to keep your urine pale yellow. Take over-the-counter or prescription medicines. Eat foods that are high in fiber, such as beans, whole grains, and fresh fruits and vegetables. Limit foods that are high in fat and processed sugars, such as fried or sweet foods. General instructions Do not use any products that contain nicotine or tobacco. These products include cigarettes, chewing tobacco, and vaping devices, such as e-cigarettes. If you need help quitting, ask your health care provider. Follow instructions from your health care provider about eating or drinking restrictions. Do not take baths, swim, or use a hot tub until your health care provider approves. Ask your health care provider if you may take showers. You may only be allowed to take sponge baths. Wear loose-fitting clothing that keeps  the catheter covered so that it cannot get caught on something. Keep your catheter clean and dry. Keep all follow-up visits. This is important. Contact a health care provider if: You have a fever or chills. You have warmth, redness, swelling, or more pain around an  incision. You have fluid or blood coming from an incision. You have pus or a bad smell coming from an incision. You cannot eat or drink without vomiting. Get help right away if: You have problems breathing. You are confused. You have trouble speaking. You have severe pain in your abdomen that does not get better with treatment. You have bright red blood in your stool (feces), or your stool is dark black and looks like tar. These symptoms may represent a serious problem that is an emergency. Do not wait to see if the symptoms will go away. Get medical help right away. Call your local emergency services (911 in the [place] Do not drive yourself to the hospital. Summary After the procedure, it is common to have some pain or discomfort in your abdomen, your incision area, or both. You may have to wait 2 weeks after your procedure before you can start peritoneal dialysis treatment. Check your incision area every day for signs of infection. Get medical help right away if you have severe pain in your abdomen that does not get better with treatment. This information is not intended to replace advice given to you by your health care provider. Make sure you discuss any questions you have with your health care provider. Document Revised: 01/12/2020 Document Reviewed: 01/12/2020 Elsevier Patient Education  [place].

## 2022-06-13 NOTE — Op Note (Signed)
Patient name: [name] MRN: [id number] DOB: [birth year] Sex: male  06/13/2022 Pre-operative Diagnosis: End-stage renal disease, need for peritoneal dialysis catheter Post-operative diagnosis:  Same Surgeon:  Eda Paschal. Donzetta Matters, MD Assistant: Leontine Locket, PA Procedure Performed: 1.  Laparoscopic lysis of intra-abdominal adhesions greater than 30 minutes 2.  Laparoscopic placement of continuous ambulatory peritoneal dialysis catheter   Indications: 47 year old male who has exhausted most of his hemodialysis access options currently has a femoral tunneled dialysis catheter ending in an occluded IVC above this.  He also has occlusions in the upper extremities.  He is now indicated for possible peritoneal dialysis catheter placement.  He does have a previous peritoneal dialysis catheter and also has history of renal transplant in the right lower quadrant.  Findings: Upon entering the abdomen there were significant abdominal adhesions mostly involving the omentum.  Multiple lesions were taken down for at least 30 minutes until we are able to get below the pubic symphysis and the catheter was then placed there.  We were able to get 600 cc of fluid to instill and withdrawal with the patient and reversed Trendelenburg and the catheter was locked with heparinized saline at completion.   Procedure:  The patient was identified in the holding area and taken to the operating room supine operative when general anesthesia was induced.  He was sterilely prepped draped in the abdomen in the usual fashion, antibiotics were ministered a timeout was called.  We began using Veress needle in the left upper quadrant we were able to get access and confirmed access with fluid flowing easily.  Patient was then hooked to insufflation and the abdomen was insufflated to a pressure of 15 atm.  Using an Optiview trocar in the right upper quadrant with a 0 degree scope we then gained access 2 cm below the costal margin.   Upon entering I noted significant intra-abdominal adhesions.  A 30 degree scope was used to fully inspect the abdomen there were no injuries from placement of Veress needle.  I then went back to the left upper quadrant and the varies needle was I extended this incision remove the needle and placed a second 5 mm trocar through that area.  I then began taking down adhesions in the epigastrium and up in the right upper quadrant using laparoscopic scissors.  I was able to get enough adhesions down to get a second working port just below the right upper quadrant port and a third 5 mm port was placed.  We then began lysing adhesions for approximately 45 minutes until we reached the pubic symphysis.  I did identify significant small bowel and colon adhered down into the pelvis.  With this I elected the safest bet would be to attempt peritoneal dialysis catheter placement that level.  I then made a 10 mm incision next the umbilicus after measuring out the catheter.  I tunneled a long 5 mm trocar and entered the peritoneum just above the pubic symphysis.  The catheter was then placed down into the pelvis as far as we could get it.  I then tunneled this via counterincision up to the costal margin and then out laterally.  Catheter was then used to instill 6 cc of saline and the bag was dropped and patient is placed in Trendelenburg and most of once was returned.  This was done with abdomen desufflated.  I then reinsufflated the abdomen and further inspected.  I flushed the catheter with heparinized saline and locked this.  I then  performed suction irrigation of the abdomen due to the clotting that was there from takedown of adhesions.  Again we inspected the abdomen there were no injuries to intra-abdominal contents.  The abdomen was then desufflated and all ports were removed.  Ports sites were closed with 4-0 Monocryl and Dermabond was placed above that.  Patient was then awakened from anesthesia having tolerated procedure  without immediate complication.  All counts were correct at completion.  EBL: 50 cc    Nicholas Ossa C. Donzetta Matters, MD [facility] Office: [phone] Pager: [phone]

## 2022-06-13 NOTE — Anesthesia Postprocedure Evaluation (Signed)
Anesthesia Post Note  Patient: [name]  Procedure(s) Performed: LAPAROSCOPIC INSERTION CONTINUOUS AMBULATORY PERITONEAL DIALYSIS  (CAPD) CATHETER (Abdomen) LAPAROSCOPIC LYSIS OF ADHESIONS (Abdomen)     Patient location during evaluation: PACU Anesthesia Type: General Level of consciousness: awake and alert Pain management: pain level controlled Vital Signs Assessment: post-procedure vital signs reviewed and stable Respiratory status: spontaneous breathing, nonlabored ventilation, respiratory function stable and patient connected to nasal cannula oxygen Cardiovascular status: blood pressure returned to baseline and stable Postop Assessment: no apparent nausea or vomiting Anesthetic complications: no   No notable events documented.  Last Vitals:  Vitals:   06/13/22 1233 06/13/22 1245  BP:  97/72  Pulse: 79 72  Resp: (!) 6 11  Temp:  36.4 C  SpO2: 100% 95%    Last Pain:  Vitals:   06/13/22 1245  TempSrc:   PainSc: Asleep                 Belenda Cruise P Marquise Lambson

## 2022-06-13 NOTE — Progress Notes (Signed)
Patient alert talkative up to wheel chair awaiting friend to pick up to transport home

## 2022-06-13 NOTE — Anesthesia Preprocedure Evaluation (Signed)
Anesthesia Evaluation  Patient identified by MRN, date of birth, ID band Patient awake    Reviewed: Allergy & Precautions, NPO status , Patient's Chart, lab work & pertinent test results  Airway Mallampati: III  TM Distance: >3 FB Neck ROM: Full    Dental no notable dental hx.    Pulmonary sleep apnea , former smoker   Pulmonary exam normal        Cardiovascular hypertension, +CHF  + dysrhythmias Atrial Fibrillation  Rhythm:Regular Rate:Normal     Neuro/Psych negative neurological ROS  negative psych ROS   GI/Hepatic negative GI ROS, Neg liver ROS,,,  Endo/Other    Morbid obesity  Renal/GU ESRF and DialysisRenal disease  negative genitourinary   Musculoskeletal  (+) Arthritis ,    Abdominal Normal abdominal exam  (+)   Peds  Hematology  (+) Blood dyscrasia, anemia   Anesthesia Other Findings   Reproductive/Obstetrics                             Anesthesia Physical Anesthesia Plan  ASA: 3  Anesthesia Plan: General   Post-op Pain Management:    Induction: Intravenous  PONV Risk Score and Plan: 2 and Ondansetron, Dexamethasone, Midazolam and Treatment may vary due to age or medical condition  Airway Management Planned: Mask and Oral ETT  Additional Equipment: None  Intra-op Plan:   Post-operative Plan: Extubation in OR  Informed Consent: I have reviewed the patients History and Physical, chart, labs and discussed the procedure including the risks, benefits and alternatives for the proposed anesthesia with the patient or authorized representative who has indicated his/her understanding and acceptance.     Dental advisory given  Plan Discussed with: CRNA  Anesthesia Plan Comments: (Lab Results      Component                Value               Date                      WBC                      4.6                 12/25/2021                HGB                      14.6                 06/13/2022                HCT                      43.0                06/13/2022                MCV                      94.6                12/25/2021                PLT  165                 12/25/2021             Lab Results      Component                Value               Date                      NA                       135                 06/13/2022                K                        5.0                 06/13/2022                CO2                      16 (L)              12/26/2021                GLUCOSE                  88                  06/13/2022                BUN                      86 (H)              06/13/2022                CREATININE               16.90 (H)           06/13/2022                CALCIUM                  8.9                 12/26/2021                GFRNONAA                 2 (L)               12/26/2021           )       Anesthesia Quick Evaluation

## 2022-06-13 NOTE — Transfer of Care (Signed)
Immediate Anesthesia Transfer of Care Note  Patient: [name]  Procedure(s) Performed: LAPAROSCOPIC INSERTION CONTINUOUS AMBULATORY PERITONEAL DIALYSIS  (CAPD) CATHETER (Abdomen) LAPAROSCOPIC LYSIS OF ADHESIONS (Abdomen)  Patient Location: PACU  Anesthesia Type:General  Level of Consciousness: awake, alert , and oriented  Airway & Oxygen Therapy: Patient Spontanous Breathing  Post-op Assessment: Report given to RN and Post -op Vital signs reviewed and stable  Post vital signs: Reviewed and stable  Last Vitals:  Vitals Value Taken Time  BP 97/71 06/13/22 1152  Temp 98   Pulse 81 06/13/22 1154  Resp 12 06/13/22 1154  SpO2 96 % 06/13/22 1154  Vitals shown include unvalidated device data.  Last Pain:  Vitals:   06/13/22 0729  TempSrc: Oral         Complications: No notable events documented.

## 2022-06-13 NOTE — Progress Notes (Signed)
Dr. Gloris Manchester made aware of patient's BP readings this morning. 74/56, 75/46, and 70/53. Per the patient, his baseline systolic BP runs low and he is not symptomatic. Per patient, Dr. Corliss Parish said to contact her if there are any questions about patient's BP readings as this is his normal. Potassium result on ISTAT was 7.2. Dr. Gloris Manchester made aware. Order for potassium received. Lab sent down.

## 2022-06-14 ENCOUNTER — Encounter (HOSPITAL_COMMUNITY): Payer: Self-pay | Admitting: Vascular Surgery

## 2022-06-14 DIAGNOSIS — Z992 Dependence on renal dialysis: Secondary | ICD-10-CM | POA: Diagnosis not present

## 2022-06-14 DIAGNOSIS — N186 End stage renal disease: Secondary | ICD-10-CM | POA: Diagnosis not present

## 2022-06-14 DIAGNOSIS — N2581 Secondary hyperparathyroidism of renal origin: Secondary | ICD-10-CM | POA: Diagnosis not present

## 2022-06-17 DIAGNOSIS — Z992 Dependence on renal dialysis: Secondary | ICD-10-CM | POA: Diagnosis not present

## 2022-06-17 DIAGNOSIS — N186 End stage renal disease: Secondary | ICD-10-CM | POA: Diagnosis not present

## 2022-06-17 DIAGNOSIS — N2581 Secondary hyperparathyroidism of renal origin: Secondary | ICD-10-CM | POA: Diagnosis not present

## 2022-06-19 DIAGNOSIS — Z992 Dependence on renal dialysis: Secondary | ICD-10-CM | POA: Diagnosis not present

## 2022-06-19 DIAGNOSIS — N186 End stage renal disease: Secondary | ICD-10-CM | POA: Diagnosis not present

## 2022-06-19 DIAGNOSIS — N2581 Secondary hyperparathyroidism of renal origin: Secondary | ICD-10-CM | POA: Diagnosis not present

## 2022-06-21 DIAGNOSIS — Z992 Dependence on renal dialysis: Secondary | ICD-10-CM | POA: Diagnosis not present

## 2022-06-21 DIAGNOSIS — N2581 Secondary hyperparathyroidism of renal origin: Secondary | ICD-10-CM | POA: Diagnosis not present

## 2022-06-21 DIAGNOSIS — N186 End stage renal disease: Secondary | ICD-10-CM | POA: Diagnosis not present

## 2022-06-24 DIAGNOSIS — Z992 Dependence on renal dialysis: Secondary | ICD-10-CM | POA: Diagnosis not present

## 2022-06-24 DIAGNOSIS — N186 End stage renal disease: Secondary | ICD-10-CM | POA: Diagnosis not present

## 2022-06-24 DIAGNOSIS — N2581 Secondary hyperparathyroidism of renal origin: Secondary | ICD-10-CM | POA: Diagnosis not present

## 2022-06-26 DIAGNOSIS — N2581 Secondary hyperparathyroidism of renal origin: Secondary | ICD-10-CM | POA: Diagnosis not present

## 2022-06-26 DIAGNOSIS — N186 End stage renal disease: Secondary | ICD-10-CM | POA: Diagnosis not present

## 2022-06-26 DIAGNOSIS — Z992 Dependence on renal dialysis: Secondary | ICD-10-CM | POA: Diagnosis not present

## 2022-06-28 DIAGNOSIS — N2581 Secondary hyperparathyroidism of renal origin: Secondary | ICD-10-CM | POA: Diagnosis not present

## 2022-06-28 DIAGNOSIS — Z992 Dependence on renal dialysis: Secondary | ICD-10-CM | POA: Diagnosis not present

## 2022-06-28 DIAGNOSIS — N186 End stage renal disease: Secondary | ICD-10-CM | POA: Diagnosis not present

## 2022-06-30 DIAGNOSIS — N186 End stage renal disease: Secondary | ICD-10-CM | POA: Diagnosis not present

## 2022-06-30 DIAGNOSIS — N2581 Secondary hyperparathyroidism of renal origin: Secondary | ICD-10-CM | POA: Diagnosis not present

## 2022-06-30 DIAGNOSIS — Z992 Dependence on renal dialysis: Secondary | ICD-10-CM | POA: Diagnosis not present

## 2022-07-01 DIAGNOSIS — N186 End stage renal disease: Secondary | ICD-10-CM | POA: Diagnosis not present

## 2022-07-01 DIAGNOSIS — Z992 Dependence on renal dialysis: Secondary | ICD-10-CM | POA: Diagnosis not present

## 2022-07-01 DIAGNOSIS — N2581 Secondary hyperparathyroidism of renal origin: Secondary | ICD-10-CM | POA: Diagnosis not present

## 2022-07-02 DIAGNOSIS — Z992 Dependence on renal dialysis: Secondary | ICD-10-CM | POA: Diagnosis not present

## 2022-07-02 DIAGNOSIS — N186 End stage renal disease: Secondary | ICD-10-CM | POA: Diagnosis not present

## 2022-07-02 DIAGNOSIS — N2581 Secondary hyperparathyroidism of renal origin: Secondary | ICD-10-CM | POA: Diagnosis not present

## 2022-07-03 DIAGNOSIS — N186 End stage renal disease: Secondary | ICD-10-CM | POA: Diagnosis not present

## 2022-07-03 DIAGNOSIS — N2581 Secondary hyperparathyroidism of renal origin: Secondary | ICD-10-CM | POA: Diagnosis not present

## 2022-07-03 DIAGNOSIS — Z992 Dependence on renal dialysis: Secondary | ICD-10-CM | POA: Diagnosis not present

## 2022-07-04 DIAGNOSIS — N186 End stage renal disease: Secondary | ICD-10-CM | POA: Diagnosis not present

## 2022-07-04 DIAGNOSIS — N2581 Secondary hyperparathyroidism of renal origin: Secondary | ICD-10-CM | POA: Diagnosis not present

## 2022-07-04 DIAGNOSIS — Z992 Dependence on renal dialysis: Secondary | ICD-10-CM | POA: Diagnosis not present

## 2022-07-07 DIAGNOSIS — N2581 Secondary hyperparathyroidism of renal origin: Secondary | ICD-10-CM | POA: Diagnosis not present

## 2022-07-07 DIAGNOSIS — N186 End stage renal disease: Secondary | ICD-10-CM | POA: Diagnosis not present

## 2022-07-07 DIAGNOSIS — Z992 Dependence on renal dialysis: Secondary | ICD-10-CM | POA: Diagnosis not present

## 2022-07-08 DIAGNOSIS — Z992 Dependence on renal dialysis: Secondary | ICD-10-CM | POA: Diagnosis not present

## 2022-07-08 DIAGNOSIS — N186 End stage renal disease: Secondary | ICD-10-CM | POA: Diagnosis not present

## 2022-07-08 DIAGNOSIS — N2581 Secondary hyperparathyroidism of renal origin: Secondary | ICD-10-CM | POA: Diagnosis not present

## 2022-07-09 DIAGNOSIS — Z992 Dependence on renal dialysis: Secondary | ICD-10-CM | POA: Diagnosis not present

## 2022-07-09 DIAGNOSIS — N041 Nephrotic syndrome with focal and segmental glomerular lesions: Secondary | ICD-10-CM | POA: Diagnosis not present

## 2022-07-09 DIAGNOSIS — N186 End stage renal disease: Secondary | ICD-10-CM | POA: Diagnosis not present

## 2022-07-10 ENCOUNTER — Telehealth (HOSPITAL_COMMUNITY): Payer: Self-pay | Admitting: *Deleted

## 2022-07-10 DIAGNOSIS — N2581 Secondary hyperparathyroidism of renal origin: Secondary | ICD-10-CM | POA: Diagnosis not present

## 2022-07-10 DIAGNOSIS — Z992 Dependence on renal dialysis: Secondary | ICD-10-CM | POA: Diagnosis not present

## 2022-07-10 DIAGNOSIS — N186 End stage renal disease: Secondary | ICD-10-CM | POA: Diagnosis not present

## 2022-07-10 NOTE — Telephone Encounter (Signed)
Received fax from Dr Pearson Grippe requesting PD cath removal. Will give to [facility].

## 2022-07-11 DIAGNOSIS — N186 End stage renal disease: Secondary | ICD-10-CM | POA: Diagnosis not present

## 2022-07-11 DIAGNOSIS — Z992 Dependence on renal dialysis: Secondary | ICD-10-CM | POA: Diagnosis not present

## 2022-07-11 DIAGNOSIS — N2581 Secondary hyperparathyroidism of renal origin: Secondary | ICD-10-CM | POA: Diagnosis not present

## 2022-07-14 DIAGNOSIS — N186 End stage renal disease: Secondary | ICD-10-CM | POA: Diagnosis not present

## 2022-07-14 DIAGNOSIS — Z992 Dependence on renal dialysis: Secondary | ICD-10-CM | POA: Diagnosis not present

## 2022-07-14 DIAGNOSIS — N2581 Secondary hyperparathyroidism of renal origin: Secondary | ICD-10-CM | POA: Diagnosis not present

## 2022-07-15 DIAGNOSIS — Z992 Dependence on renal dialysis: Secondary | ICD-10-CM | POA: Diagnosis not present

## 2022-07-15 DIAGNOSIS — N2581 Secondary hyperparathyroidism of renal origin: Secondary | ICD-10-CM | POA: Diagnosis not present

## 2022-07-15 DIAGNOSIS — N186 End stage renal disease: Secondary | ICD-10-CM | POA: Diagnosis not present

## 2022-07-16 ENCOUNTER — Ambulatory Visit (INDEPENDENT_AMBULATORY_CARE_PROVIDER_SITE_OTHER): Payer: Medicare HMO | Admitting: Podiatry

## 2022-07-16 VITALS — BP 138/78

## 2022-07-16 DIAGNOSIS — Z992 Dependence on renal dialysis: Secondary | ICD-10-CM

## 2022-07-16 DIAGNOSIS — I999 Unspecified disorder of circulatory system: Secondary | ICD-10-CM

## 2022-07-16 DIAGNOSIS — L97522 Non-pressure chronic ulcer of other part of left foot with fat layer exposed: Secondary | ICD-10-CM | POA: Diagnosis not present

## 2022-07-16 DIAGNOSIS — N186 End stage renal disease: Secondary | ICD-10-CM | POA: Diagnosis not present

## 2022-07-16 MED ORDER — DOXYCYCLINE HYCLATE 100 MG PO TABS: 100.0000 mg | ORAL_TABLET | Freq: Two times a day (BID) | ORAL | 0 refills | Status: AC

## 2022-07-16 NOTE — Telephone Encounter (Signed)
Per Dr. Donzetta Matters, [place] to schedule PD cath removal.   Contacted patient to schedule surgery. Patient stated he was busy and hung up phone.

## 2022-07-16 NOTE — Progress Notes (Signed)
Subjective:  Patient ID: [name], male    DOB: 1975-09-12,  MRN: [id number]  Chief Complaint  Patient presents with   Toe Pain    Pt stated that his toe got infected about a month ago and its never healed     47 y.o. male presents with the above complaint.  Patient presents with complaint left hallux ulceration.  Patient states it got infected about a month ago out of nowhere.  He is a dialysis patient and never healed.  He is not a diabetic.  He wanted to get it evaluated.  He has not been doing anything for it and keeping it somewhat covered.  He has taken antibiotics from his primary care physician which has not helped.  He has not had a recent vascular study done.   Review of Systems: Negative except as noted in the HPI. Denies N/V/F/Ch.  Past Medical History:  Diagnosis Date   Acute respiratory failure with hypoxia ([place]) 11/30/2018   Anemia    ESRD   ESRD on hemodialysis ([place]) 06/07/2011   TTS [facility]. Started HD in 2006, got transplant in 2014 lasted until Dec 2019 then went back on HD.  Has L thigh AVG as of Jun 2020.  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    History of blood transfusion    Hypertension    03/19/22- has not had high blood pressure in 5 years.   Morbid obesity ([place])    Paroxysmal atrial fibrillation ([place])    Pre-diabetes    no meds   Sepsis ([place]) 11/30/2018   Sleep apnea    lost weight, does not use CPAP    Current Outpatient Medications:    doxycycline (VIBRA-TABS) 100 MG tablet, Take 1 tablet (100 mg total) by mouth 2 (two) times daily., Disp: 60 tablet, Rfl: 0   apixaban (ELIQUIS) 5 MG TABS tablet, Take 5 mg by mouth 2 (two) times daily., Disp: , Rfl:    atorvastatin (LIPITOR) 40 MG tablet, Take 1 tablet (40 mg total) by mouth daily., Disp: 90 tablet, Rfl: 3   B Complex-C-Zn-Folic Acid (DIALYVITE/ZINC) TABS, Take 1 tablet by mouth daily., Disp: , Rfl:    Calcium Carb-Cholecalciferol (CALCIUM 600 + D PO), Take 2 tablets by mouth  daily., Disp: , Rfl:    ferric citrate (AURYXIA) 1 GM 210 MG(Fe) tablet, Take 210-630 mg by mouth See admin instructions. Take 630 mg with each meal and 210 mg with each snack, Disp: , Rfl:    mupirocin ointment (BACTROBAN) 2 %, Apply 1 Application topically 2 (two) times daily as needed (wound care)., Disp: , Rfl:    oxyCODONE-acetaminophen (PERCOCET) 5-325 MG tablet, Take 1 tablet by mouth every 6 (six) hours as needed for severe pain., Disp: 15 tablet, Rfl: 0  Social History   Tobacco Use  Smoking Status Former   Types: Cigarettes   Quit date: 11/08/2015   Years since quitting: 6.6  Smokeless Tobacco Never    No Known Allergies Objective:   Vitals:   07/16/22 0827  BP: 138/78   There is no height or weight on file to calculate BMI. Constitutional Well developed. Well nourished.  Vascular Dorsalis pedis pulses palpable bilaterally. Posterior tibial pulses palpable bilaterally. Capillary refill normal to all digits.  No cyanosis or clubbing noted. Pedal hair growth normal.  Neurologic Normal speech. Oriented to person, place, and time. Epicritic sensation to light touch grossly present bilaterally.  Dermatologic Left hallux ulceration with fat layer exposed probing  down to deep tissue not bone no purulent drainage noted.  No redness noted.  Orthopedic: Normal joint ROM without pain or crepitus bilaterally. No visible deformities. No bony tenderness.   Radiographs: None Assessment:   1. Vascular abnormality   2. Skin ulcer of great toe with fat layer exposed, left ([name])   3. ESRD on dialysis [facility])    Plan:  Patient was evaluated and treated and all questions answered.  Left hallux ulceration fat layer exposed on dialysis -All questions and concerns were discussed with the patient in extensive detail. -Minimal debridement was carried out -He will benefit from 30 days of doxycycline to help keep the infection at [place]. -He will do Betadine wet-to-dry dressing changes  daily -He will place himself back in the surgical shoe -I will order ABIs PVRs to reassess the blood flow.  No follow-ups on file.

## 2022-07-17 ENCOUNTER — Other Ambulatory Visit: Payer: Self-pay

## 2022-07-17 DIAGNOSIS — N186 End stage renal disease: Secondary | ICD-10-CM

## 2022-07-17 DIAGNOSIS — N2581 Secondary hyperparathyroidism of renal origin: Secondary | ICD-10-CM | POA: Diagnosis not present

## 2022-07-17 DIAGNOSIS — Z992 Dependence on renal dialysis: Secondary | ICD-10-CM | POA: Diagnosis not present

## 2022-07-17 NOTE — Telephone Encounter (Signed)
Spoke with patient and scheduled for PD cath removal on 08/08/22. Instructions provided and patient verbalized understanding.

## 2022-07-18 ENCOUNTER — Telehealth (HOSPITAL_COMMUNITY): Payer: Self-pay | Admitting: *Deleted

## 2022-07-18 NOTE — Telephone Encounter (Signed)
Received fax from [facility] requesting PD catheter removal. Will give to [facility]

## 2022-07-19 DIAGNOSIS — N186 End stage renal disease: Secondary | ICD-10-CM | POA: Diagnosis not present

## 2022-07-19 DIAGNOSIS — N2581 Secondary hyperparathyroidism of renal origin: Secondary | ICD-10-CM | POA: Diagnosis not present

## 2022-07-19 DIAGNOSIS — Z992 Dependence on renal dialysis: Secondary | ICD-10-CM | POA: Diagnosis not present

## 2022-07-21 ENCOUNTER — Ambulatory Visit: Payer: Self-pay

## 2022-07-21 NOTE — Patient Outreach (Signed)
  Care Coordination   Follow Up Visit Note   07/21/2022 Name: [name] MRN: [id number] DOB: [birth year]  [name] is a 47 y.o. year old male who sees Rudd, Lillette Boxer, MD for primary care. I spoke with  [name] by phone today.  What matters to the patients health and wellness today?  none    Goals Addressed             This Visit's Progress    Health Maintainance       Care Coordination Interventions: Evaluation of current treatment plan related to ESRD management and patient's adherence to plan as established by provider Discussed plans with patient for ongoing care management follow up and provided patient with direct contact information for care management team  Patient reports doing okay. Currently doing hemodialysis.  No concerns.          SDOH assessments and interventions completed:  Yes  SDOH Interventions Today    Flowsheet Row Most Recent Value  SDOH Interventions   Food Insecurity Interventions Intervention Not Indicated  Housing Interventions Intervention Not Indicated        Care Coordination Interventions:  Yes, provided   Follow up plan: Follow up call scheduled for April    Encounter Outcome:  Pt. Visit Completed   Jone Baseman, RN, MSN [facility] Care Management Coordinator Direct Line [phone]

## 2022-07-21 NOTE — Patient Instructions (Signed)
Visit Information  Thank you for taking time to visit with me today. Please don't hesitate to contact me if I can be of assistance to you.   Following are the goals we discussed today:   Goals Addressed             This Visit's Progress    Health Maintainance       Care Coordination Interventions: Evaluation of current treatment plan related to ESRD management and patient's adherence to plan as established by provider Discussed plans with patient for ongoing care management follow up and provided patient with direct contact information for care management team  Patient reports doing okay. Currently doing hemodialysis.  No concerns.          Our next appointment is by telephone on 09/15/22 at 1000  Please call the care guide team at [phone] if you need to cancel or reschedule your appointment.   If you are experiencing a Mental Health or [place] or need someone to talk to, please call the Suicide and Crisis Lifeline: 988   Patient verbalizes understanding of instructions and care plan provided today and agrees to view in [place]. Active MyChart status and patient understanding of how to access instructions and care plan via MyChart confirmed with patient.     The patient has been provided with contact information for the care management team and has been advised to call with any health related questions or concerns.   Jone Baseman, RN, MSN [facility] Care Management Coordinator Direct Line [phone]

## 2022-07-22 DIAGNOSIS — Z992 Dependence on renal dialysis: Secondary | ICD-10-CM | POA: Diagnosis not present

## 2022-07-22 DIAGNOSIS — N2581 Secondary hyperparathyroidism of renal origin: Secondary | ICD-10-CM | POA: Diagnosis not present

## 2022-07-22 DIAGNOSIS — N186 End stage renal disease: Secondary | ICD-10-CM | POA: Diagnosis not present

## 2022-07-24 DIAGNOSIS — N2581 Secondary hyperparathyroidism of renal origin: Secondary | ICD-10-CM | POA: Diagnosis not present

## 2022-07-24 DIAGNOSIS — Z992 Dependence on renal dialysis: Secondary | ICD-10-CM | POA: Diagnosis not present

## 2022-07-24 DIAGNOSIS — N186 End stage renal disease: Secondary | ICD-10-CM | POA: Diagnosis not present

## 2022-07-25 ENCOUNTER — Ambulatory Visit (HOSPITAL_COMMUNITY)
Admission: RE | Admit: 2022-07-25 | Discharge: 2022-07-25 | Disposition: A | Discharge status: Home / Self Care | Payer: Medicare HMO | Source: Ambulatory Visit | Attending: Podiatry | Admitting: Podiatry

## 2022-07-25 DIAGNOSIS — I999 Unspecified disorder of circulatory system: Secondary | ICD-10-CM | POA: Insufficient documentation

## 2022-07-25 LAB — VAS US ABI WITH/WO TBI
Left ABI: 1.14
Right ABI: 1.13

## 2022-07-26 DIAGNOSIS — N2581 Secondary hyperparathyroidism of renal origin: Secondary | ICD-10-CM | POA: Diagnosis not present

## 2022-07-26 DIAGNOSIS — N186 End stage renal disease: Secondary | ICD-10-CM | POA: Diagnosis not present

## 2022-07-26 DIAGNOSIS — Z992 Dependence on renal dialysis: Secondary | ICD-10-CM | POA: Diagnosis not present

## 2022-07-29 DIAGNOSIS — Z992 Dependence on renal dialysis: Secondary | ICD-10-CM | POA: Diagnosis not present

## 2022-07-29 DIAGNOSIS — N186 End stage renal disease: Secondary | ICD-10-CM | POA: Diagnosis not present

## 2022-07-29 DIAGNOSIS — N2581 Secondary hyperparathyroidism of renal origin: Secondary | ICD-10-CM | POA: Diagnosis not present

## 2022-07-31 DIAGNOSIS — N2581 Secondary hyperparathyroidism of renal origin: Secondary | ICD-10-CM | POA: Diagnosis not present

## 2022-07-31 DIAGNOSIS — Z992 Dependence on renal dialysis: Secondary | ICD-10-CM | POA: Diagnosis not present

## 2022-07-31 DIAGNOSIS — N186 End stage renal disease: Secondary | ICD-10-CM | POA: Diagnosis not present

## 2022-08-01 ENCOUNTER — Telehealth: Payer: Self-pay | Admitting: Family Medicine

## 2022-08-01 NOTE — Telephone Encounter (Signed)
Contacted [facility] to schedule their annual wellness visit. Patient declined to schedule AWV at this time.  [facility] AWV direct phone # [phone]   Spoke to patient he wanted ca call back after 2

## 2022-08-02 DIAGNOSIS — N186 End stage renal disease: Secondary | ICD-10-CM | POA: Diagnosis not present

## 2022-08-02 DIAGNOSIS — N2581 Secondary hyperparathyroidism of renal origin: Secondary | ICD-10-CM | POA: Diagnosis not present

## 2022-08-02 DIAGNOSIS — Z992 Dependence on renal dialysis: Secondary | ICD-10-CM | POA: Diagnosis not present

## 2022-08-05 DIAGNOSIS — N186 End stage renal disease: Secondary | ICD-10-CM | POA: Diagnosis not present

## 2022-08-05 DIAGNOSIS — Z992 Dependence on renal dialysis: Secondary | ICD-10-CM | POA: Diagnosis not present

## 2022-08-05 DIAGNOSIS — N2581 Secondary hyperparathyroidism of renal origin: Secondary | ICD-10-CM | POA: Diagnosis not present

## 2022-08-06 ENCOUNTER — Ambulatory Visit: Payer: Medicare HMO | Admitting: Podiatry

## 2022-08-06 ENCOUNTER — Encounter (HOSPITAL_COMMUNITY): Payer: Self-pay | Admitting: Vascular Surgery

## 2022-08-06 ENCOUNTER — Ambulatory Visit (INDEPENDENT_AMBULATORY_CARE_PROVIDER_SITE_OTHER): Payer: Medicare HMO

## 2022-08-06 ENCOUNTER — Other Ambulatory Visit: Payer: Self-pay

## 2022-08-06 DIAGNOSIS — L97521 Non-pressure chronic ulcer of other part of left foot limited to breakdown of skin: Secondary | ICD-10-CM | POA: Diagnosis not present

## 2022-08-06 DIAGNOSIS — L97522 Non-pressure chronic ulcer of other part of left foot with fat layer exposed: Secondary | ICD-10-CM

## 2022-08-06 DIAGNOSIS — I999 Unspecified disorder of circulatory system: Secondary | ICD-10-CM | POA: Diagnosis not present

## 2022-08-06 DIAGNOSIS — M869 Osteomyelitis, unspecified: Secondary | ICD-10-CM

## 2022-08-06 NOTE — Progress Notes (Signed)
Subjective:  Patient ID: [name], male    DOB: 05-31-1976,  MRN: [id number]  Chief Complaint  Patient presents with   Vascular abnormality    47 y.o. male presents with the above complaint.  Patient presents with follow-up to left hallux ulceration.  Patient states looks about the same has been doing Betadine wet-to-dry dressing.  Taking his antibiotics denies any other acute complaints   Review of Systems: Negative except as noted in the HPI. Denies N/V/F/Ch.  Past Medical History:  Diagnosis Date   Acute respiratory failure with hypoxia ([place]) 11/30/2018   Anemia    ESRD   ESRD on hemodialysis ([place]) 06/07/2011   TTS [facility]. Started HD in 2006, got transplant in 2014 lasted until Dec 2019 then went back on HD.  Has L thigh AVG as of Jun 2020.  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    History of blood transfusion    Hypertension    03/19/22- has not had high blood pressure in 5 years.   Morbid obesity ([place])    Paroxysmal atrial fibrillation ([place])    Pre-diabetes    no meds   Sepsis ([place]) 11/30/2018   Sleep apnea    lost weight, does not use CPAP    Current Outpatient Medications:    apixaban (ELIQUIS) 5 MG TABS tablet, Take 5 mg by mouth 2 (two) times daily., Disp: , Rfl:    atorvastatin (LIPITOR) 40 MG tablet, Take 1 tablet (40 mg total) by mouth daily., Disp: 90 tablet, Rfl: 3   B Complex-C-Zn-Folic Acid (DIALYVITE/ZINC) TABS, Take 1 tablet by mouth daily. (Patient not taking: Reported on 08/05/2022), Disp: , Rfl:    Calcium Carb-Cholecalciferol (CALCIUM 600 + D PO), Take 2 tablets by mouth daily. (Patient not taking: Reported on 08/05/2022), Disp: , Rfl:    doxycycline (VIBRA-TABS) 100 MG tablet, Take 1 tablet (100 mg total) by mouth 2 (two) times daily., Disp: 60 tablet, Rfl: 0   ferric citrate (AURYXIA) 1 GM 210 MG(Fe) tablet, Take 210-630 mg by mouth See admin instructions. Take 630 mg with each meal and 210 mg with each snack, Disp: , Rfl:    Social History   Tobacco Use  Smoking Status Former   Types: Cigarettes   Quit date: 11/08/2015   Years since quitting: 6.7  Smokeless Tobacco Never    No Known Allergies Objective:   There were no vitals filed for this visit.  There is no height or weight on file to calculate BMI. Constitutional Well developed. Well nourished.  Vascular Dorsalis pedis pulses palpable bilaterally. Posterior tibial pulses palpable bilaterally. Capillary refill normal to all digits.  No cyanosis or clubbing noted. Pedal hair growth normal.  Neurologic Normal speech. Oriented to person, place, and time. Epicritic sensation to light touch grossly present bilaterally.  Dermatologic Left hallux ulceration with fat layer exposed probing down to deep tissue not bone no purulent drainage noted.  No redness noted.  Orthopedic: Normal joint ROM without pain or crepitus bilaterally. No visible deformities. No bony tenderness.   Radiographs: None Assessment:   1. Skin ulcer of great toe with fat layer exposed, left ([place])   2. Vascular abnormality   3. Osteomyelitis of great toe of left foot ([place])    Plan:  Patient was evaluated and treated and all questions answered.  Left hallux ulceration fat layer exposed on dialysis regressing -All questions and concerns were discussed with the patient in extensive detail -X-rays show radiographic evidence and evidence  of osteomyelitis.  I discussed with the patient given the findings of osteomyelitis and severe destruction is present patient will benefit from  elective amputation partial hallux likely versus total hallux amputation of the left side.  I discussed with patient he states understanding he will go to the [facility] emergency room to be admitted for IV antibiotics MRI and likely surgical intervention. -ABIs were reviewed with the patient which shows normal flow to the lower extremity. -Betadine wet-to-dry -Continue wearing surgical shoe -He is  currently on p.o. doxycycline. No follow-ups on file.

## 2022-08-06 NOTE — Progress Notes (Signed)
PCP - Arlester Marker, MD Cardiologist - Croitoru, Dani Gobble, MD  PPM/ICD - denies  Chest x-ray - 02/15/21 EKG - 12/26/21 ECHO - 12/01/18  CPAP - not using  - lost weight  Fasting Blood Sugar - n/a  Blood Thinner Instructions: Eliquis - last dose - 08/05/22 per patient Patient was instructed: As of today, STOP taking any Aspirin (unless otherwise instructed by your surgeon) Aleve, Naproxen, Ibuprofen, Motrin, Advil, Goody's, BC's, all herbal medications, fish oil, and all vitamins.  ERAS Protcol - n/a  COVID TEST- n/a  Anesthesia review: yes  Patient verbally denies any shortness of breath, fever, cough and chest pain during phone call   -------------  SDW INSTRUCTIONS given:  Your procedure is scheduled on Friday, March 1st, 2024.  Report to [facility] Main Entrance "A" at 05:30 A.M., and check in at the Admitting office.  Call this number if you have problems the morning of surgery:  [phone]   Remember:  Do not eat or drink after midnight the night before your surgery    Take these medicines the morning of surgery with A SIP OF WATER: Lipitor, Doxycycline    The day of surgery:                    Do not wear jewelry,             Do not wear lotions, powders, colognes, or deodorant.            Men may shave face and neck.            Do not bring valuables to the hospital.            [facility] is not responsible for any belongings or valuables.  Do NOT Smoke (Tobacco/Vaping) 24 hours prior to your procedure If you use a CPAP at night, you may bring all equipment for your overnight stay.   Contacts, glasses, dentures or bridgework may not be worn into surgery.      For patients admitted to the hospital, discharge time will be determined by your treatment team.   Patients discharged the day of surgery will not be allowed to drive home, and someone needs to stay with them for 24 hours.    Special instructions:   [place]- Preparing For Surgery  Before  surgery, you can play an important role. Because skin is not sterile, your skin needs to be as free of germs as possible. You can reduce the number of germs on your skin by washing with CHG (chlorahexidine gluconate) Soap before surgery.  CHG is an antiseptic cleaner which kills germs and bonds with the skin to continue killing germs even after washing.    Oral Hygiene is also important to reduce your risk of infection.  Remember - BRUSH YOUR TEETH THE MORNING OF SURGERY WITH YOUR REGULAR TOOTHPASTE  Please do not use if you have an allergy to CHG or antibacterial soaps. If your skin becomes reddened/irritated stop using the CHG.  Do not shave (including legs and underarms) for at least 48 hours prior to first CHG shower. It is OK to shave your face.  Please follow these instructions carefully.   Shower the NIGHT BEFORE SURGERY and the MORNING OF SURGERY with DIAL Soap.   Pat yourself dry with a CLEAN TOWEL.  Wear CLEAN PAJAMAS to bed the night before surgery  Place CLEAN SHEETS on your bed the night of your first shower and DO NOT SLEEP WITH PETS.  Day of Surgery: Please shower morning of surgery  Wear Clean/Comfortable clothing the morning of surgery Do not apply any deodorants/lotions.   Remember to brush your teeth WITH YOUR REGULAR TOOTHPASTE.   Questions were answered. Patient verbalized understanding of instructions.

## 2022-08-07 DIAGNOSIS — N041 Nephrotic syndrome with focal and segmental glomerular lesions: Secondary | ICD-10-CM | POA: Diagnosis not present

## 2022-08-07 DIAGNOSIS — N186 End stage renal disease: Secondary | ICD-10-CM | POA: Diagnosis not present

## 2022-08-07 DIAGNOSIS — Z992 Dependence on renal dialysis: Secondary | ICD-10-CM | POA: Diagnosis not present

## 2022-08-07 DIAGNOSIS — N2581 Secondary hyperparathyroidism of renal origin: Secondary | ICD-10-CM | POA: Diagnosis not present

## 2022-08-07 NOTE — Anesthesia Preprocedure Evaluation (Addendum)
Anesthesia Evaluation  Patient identified by MRN, date of birth, ID band Patient awake    Reviewed: Allergy & Precautions, NPO status , Patient's Chart, lab work & pertinent test results  Airway Mallampati: II  TM Distance: >3 FB Neck ROM: Full    Dental no notable dental hx.    Pulmonary sleep apnea , Patient abstained from smoking., former smoker   Pulmonary exam normal        Cardiovascular hypertension, +CHF   Rhythm:Regular Rate:Normal     Neuro/Psych negative neurological ROS  negative psych ROS   GI/Hepatic negative GI ROS, Neg liver ROS,,,  Endo/Other    Morbid obesity  Renal/GU ESRF and DialysisRenal disease     Musculoskeletal negative musculoskeletal ROS (+)    Abdominal Normal abdominal exam  (+)   Peds  Hematology  (+) Blood dyscrasia, anemia   Anesthesia Other Findings   Reproductive/Obstetrics                             Anesthesia Physical Anesthesia Plan  ASA: 3  Anesthesia Plan: General   Post-op Pain Management:    Induction: Intravenous  PONV Risk Score and Plan: 2 and Ondansetron, Dexamethasone, Midazolam and Treatment may vary due to age or medical condition  Airway Management Planned: Mask and Oral ETT  Additional Equipment: None  Intra-op Plan:   Post-operative Plan: Extubation in OR  Informed Consent: I have reviewed the patients History and Physical, chart, labs and discussed the procedure including the risks, benefits and alternatives for the proposed anesthesia with the patient or authorized representative who has indicated his/her understanding and acceptance.     Dental advisory given  Plan Discussed with: CRNA  Anesthesia Plan Comments:        Anesthesia Quick Evaluation

## 2022-08-08 ENCOUNTER — Encounter (HOSPITAL_COMMUNITY): Payer: Self-pay | Admitting: Vascular Surgery

## 2022-08-08 ENCOUNTER — Other Ambulatory Visit: Payer: Self-pay

## 2022-08-08 ENCOUNTER — Ambulatory Visit (HOSPITAL_COMMUNITY)
Admission: RE | Admit: 2022-08-08 | Discharge: 2022-08-08 | Disposition: A | Discharge status: Home / Self Care | Payer: Medicare HMO | Source: Ambulatory Visit | Attending: Vascular Surgery | Admitting: Vascular Surgery

## 2022-08-08 ENCOUNTER — Encounter (HOSPITAL_COMMUNITY)
Admission: RE | Disposition: A | Discharge status: Home / Self Care | Payer: Self-pay | Source: Ambulatory Visit | Attending: Vascular Surgery

## 2022-08-08 ENCOUNTER — Ambulatory Visit (HOSPITAL_BASED_OUTPATIENT_CLINIC_OR_DEPARTMENT_OTHER): Payer: Medicare HMO | Admitting: Certified Registered Nurse Anesthetist

## 2022-08-08 ENCOUNTER — Ambulatory Visit (HOSPITAL_COMMUNITY): Payer: Medicare HMO | Admitting: Certified Registered Nurse Anesthetist

## 2022-08-08 DIAGNOSIS — Z7901 Long term (current) use of anticoagulants: Secondary | ICD-10-CM | POA: Diagnosis not present

## 2022-08-08 DIAGNOSIS — N186 End stage renal disease: Secondary | ICD-10-CM

## 2022-08-08 DIAGNOSIS — I132 Hypertensive heart and chronic kidney disease with heart failure and with stage 5 chronic kidney disease, or end stage renal disease: Secondary | ICD-10-CM | POA: Insufficient documentation

## 2022-08-08 DIAGNOSIS — I509 Heart failure, unspecified: Secondary | ICD-10-CM | POA: Diagnosis not present

## 2022-08-08 DIAGNOSIS — N2581 Secondary hyperparathyroidism of renal origin: Secondary | ICD-10-CM | POA: Diagnosis not present

## 2022-08-08 DIAGNOSIS — D631 Anemia in chronic kidney disease: Secondary | ICD-10-CM | POA: Diagnosis not present

## 2022-08-08 DIAGNOSIS — N041 Nephrotic syndrome with focal and segmental glomerular lesions: Secondary | ICD-10-CM | POA: Diagnosis not present

## 2022-08-08 DIAGNOSIS — Z87891 Personal history of nicotine dependence: Secondary | ICD-10-CM

## 2022-08-08 DIAGNOSIS — N185 Chronic kidney disease, stage 5: Secondary | ICD-10-CM | POA: Diagnosis not present

## 2022-08-08 DIAGNOSIS — Z992 Dependence on renal dialysis: Secondary | ICD-10-CM | POA: Diagnosis not present

## 2022-08-08 DIAGNOSIS — G4730 Sleep apnea, unspecified: Secondary | ICD-10-CM | POA: Diagnosis not present

## 2022-08-08 DIAGNOSIS — T85691A Other mechanical complication of intraperitoneal dialysis catheter, initial encounter: Secondary | ICD-10-CM

## 2022-08-08 DIAGNOSIS — Z6841 Body Mass Index (BMI) 40.0 and over, adult: Secondary | ICD-10-CM | POA: Diagnosis not present

## 2022-08-08 HISTORY — PX: CAPD REMOVAL: SHX5234

## 2022-08-08 SURGERY — LAPAROSCOPIC REMOVAL CONTINUOUS AMBULATORY PERITONEAL DIALYSIS  (CAPD) CATHETER
Anesthesia: General | Site: Abdomen

## 2022-08-08 MED ORDER — ACETAMINOPHEN 10 MG/ML IV SOLN
1000.0000 mg | Freq: Once | INTRAVENOUS | Status: DC | PRN
  Administered 2022-08-08: 1000 mg via INTRAVENOUS

## 2022-08-08 MED ORDER — LIDOCAINE 2% (20 MG/ML) 5 ML SYRINGE
INTRAMUSCULAR | Status: DC | PRN
  Administered 2022-08-08: 80 mg via INTRAVENOUS

## 2022-08-08 MED ORDER — ORAL CARE MOUTH RINSE: 15.0000 mL | Freq: Once | OROMUCOSAL | Status: AC

## 2022-08-08 MED ORDER — OXYCODONE HCL 5 MG PO TABS
5.0000 mg | ORAL_TABLET | Freq: Once | ORAL | Status: AC
  Administered 2022-08-08: 5 mg via ORAL
  Filled 2022-08-08: qty 1

## 2022-08-08 MED ORDER — CHLORHEXIDINE GLUCONATE 0.12 % MT SOLN
OROMUCOSAL | Status: AC
  Administered 2022-08-08: 15 mL via OROMUCOSAL
  Filled 2022-08-08: qty 15

## 2022-08-08 MED ORDER — ONDANSETRON HCL 4 MG/2ML IJ SOLN
INTRAMUSCULAR | Status: DC | PRN
  Administered 2022-08-08: 4 mg via INTRAVENOUS

## 2022-08-08 MED ORDER — CEFAZOLIN IN SODIUM CHLORIDE 3-0.9 GM/100ML-% IV SOLN
3.0000 g | INTRAVENOUS | Status: AC
  Administered 2022-08-08: 3 g via INTRAVENOUS
  Filled 2022-08-08: qty 100

## 2022-08-08 MED ORDER — CHLORHEXIDINE GLUCONATE 4 % EX LIQD: 60.0000 mL | Freq: Once | CUTANEOUS | Status: DC

## 2022-08-08 MED ORDER — CHLORHEXIDINE GLUCONATE 0.12 % MT SOLN: 15.0000 mL | Freq: Once | OROMUCOSAL | Status: AC

## 2022-08-08 MED ORDER — FENTANYL CITRATE (PF) 250 MCG/5ML IJ SOLN
INTRAMUSCULAR | Status: DC | PRN
  Administered 2022-08-08 (×3): 50 ug via INTRAVENOUS

## 2022-08-08 MED ORDER — FENTANYL CITRATE (PF) 100 MCG/2ML IJ SOLN
INTRAMUSCULAR | Status: AC
  Filled 2022-08-08: qty 2

## 2022-08-08 MED ORDER — SODIUM CHLORIDE 0.9 % IV SOLN: INTRAVENOUS | Status: DC

## 2022-08-08 MED ORDER — OXYCODONE HCL 5 MG PO TABS
ORAL_TABLET | ORAL | Status: AC
  Filled 2022-08-08: qty 1

## 2022-08-08 MED ORDER — MIDAZOLAM HCL 2 MG/2ML IJ SOLN
INTRAMUSCULAR | Status: DC | PRN
  Administered 2022-08-08: 2 mg via INTRAVENOUS

## 2022-08-08 MED ORDER — LIDOCAINE HCL (PF) 1 % IJ SOLN
INTRAMUSCULAR | Status: AC
  Filled 2022-08-08: qty 30

## 2022-08-08 MED ORDER — SODIUM CHLORIDE 0.9 % IR SOLN
Status: DC | PRN
  Administered 2022-08-08: 1

## 2022-08-08 MED ORDER — ALTEPLASE 2 MG IJ SOLR
2.0000 mg | Freq: Once | INTRAMUSCULAR | Status: AC
  Administered 2022-08-08: 2 mg
  Filled 2022-08-08: qty 2

## 2022-08-08 MED ORDER — PHENYLEPHRINE HCL (PRESSORS) 10 MG/ML IV SOLN
INTRAVENOUS | Status: DC | PRN
  Administered 2022-08-08 (×2): 80 ug via INTRAVENOUS

## 2022-08-08 MED ORDER — SUGAMMADEX SODIUM 200 MG/2ML IV SOLN
INTRAVENOUS | Status: DC | PRN
  Administered 2022-08-08: 300 mg via INTRAVENOUS

## 2022-08-08 MED ORDER — PROPOFOL 10 MG/ML IV BOLUS
INTRAVENOUS | Status: AC
  Filled 2022-08-08: qty 20

## 2022-08-08 MED ORDER — DEXAMETHASONE SODIUM PHOSPHATE 10 MG/ML IJ SOLN
INTRAMUSCULAR | Status: DC | PRN
  Administered 2022-08-08: 5 mg via INTRAVENOUS

## 2022-08-08 MED ORDER — PROPOFOL 10 MG/ML IV BOLUS
INTRAVENOUS | Status: DC | PRN
  Administered 2022-08-08: 150 mg via INTRAVENOUS

## 2022-08-08 MED ORDER — OXYCODONE-ACETAMINOPHEN 5-325 MG PO TABS: 1.0000 | ORAL_TABLET | Freq: Four times a day (QID) | ORAL | 0 refills | Status: DC | PRN

## 2022-08-08 MED ORDER — PHENYLEPHRINE HCL-NACL 20-0.9 MG/250ML-% IV SOLN
INTRAVENOUS | Status: DC | PRN
  Administered 2022-08-08: 25 ug/min via INTRAVENOUS

## 2022-08-08 MED ORDER — ROCURONIUM BROMIDE 10 MG/ML (PF) SYRINGE
PREFILLED_SYRINGE | INTRAVENOUS | Status: DC | PRN
  Administered 2022-08-08: 60 mg via INTRAVENOUS

## 2022-08-08 MED ORDER — FENTANYL CITRATE (PF) 250 MCG/5ML IJ SOLN
INTRAMUSCULAR | Status: AC
  Filled 2022-08-08: qty 5

## 2022-08-08 MED ORDER — ALBUMIN HUMAN 5 % IV SOLN: INTRAVENOUS | Status: DC | PRN

## 2022-08-08 MED ORDER — FENTANYL CITRATE (PF) 100 MCG/2ML IJ SOLN
25.0000 ug | INTRAMUSCULAR | Status: DC | PRN
  Administered 2022-08-08 (×2): 50 ug via INTRAVENOUS

## 2022-08-08 MED ORDER — MIDAZOLAM HCL 2 MG/2ML IJ SOLN
INTRAMUSCULAR | Status: AC
  Filled 2022-08-08: qty 2

## 2022-08-08 SURGICAL SUPPLY — 42 items
ADH SKN CLS APL DERMABOND .7 (GAUZE/BANDAGES/DRESSINGS) ×1
APL PRP STRL LF DISP 70% ISPRP (MISCELLANEOUS) ×1
BAG COUNTER SPONGE SURGICOUNT (BAG) ×2 IMPLANT
BAG SPNG CNTER NS LX DISP (BAG) ×1
BLADE CLIPPER SURG (BLADE) IMPLANT
CANISTER SUCT 3000ML PPV (MISCELLANEOUS) IMPLANT
CHLORAPREP W/TINT 26 (MISCELLANEOUS) ×2 IMPLANT
COVER PROBE W GEL 5X96 (DRAPES) IMPLANT
COVER SURGICAL LIGHT HANDLE (MISCELLANEOUS) ×2 IMPLANT
DERMABOND ADVANCED .7 DNX12 (GAUZE/BANDAGES/DRESSINGS) ×2 IMPLANT
DISSECTOR BLUNT TIP ENDO 5MM (MISCELLANEOUS) IMPLANT
DRAPE LAPAROSCOPIC ABDOMINAL (DRAPES) IMPLANT
ELECT REM PT RETURN 9FT ADLT (ELECTROSURGICAL) ×1
ELECTRODE REM PT RTRN 9FT ADLT (ELECTROSURGICAL) ×2 IMPLANT
GAUZE SPONGE 2X2 8PLY STRL LF (GAUZE/BANDAGES/DRESSINGS) IMPLANT
GLOVE BIOGEL PI IND STRL 7.5 (GLOVE) ×2 IMPLANT
GOWN STRL REUS W/ TWL LRG LVL3 (GOWN DISPOSABLE) ×4 IMPLANT
GOWN STRL REUS W/ TWL XL LVL3 (GOWN DISPOSABLE) ×2 IMPLANT
GOWN STRL REUS W/TWL LRG LVL3 (GOWN DISPOSABLE) ×2
GOWN STRL REUS W/TWL XL LVL3 (GOWN DISPOSABLE) ×1
IRRIG SUCT STRYKERFLOW 2 WTIP (MISCELLANEOUS)
IRRIGATION SUCT STRKRFLW 2 WTP (MISCELLANEOUS) IMPLANT
KIT BASIN OR (CUSTOM PROCEDURE TRAY) ×2 IMPLANT
KIT TURNOVER KIT B (KITS) ×2 IMPLANT
NS IRRIG 1000ML POUR BTL (IV SOLUTION) ×2 IMPLANT
PACK GENERAL/GYN (CUSTOM PROCEDURE TRAY) ×2 IMPLANT
PAD ARMBOARD 7.5X6 YLW CONV (MISCELLANEOUS) ×2 IMPLANT
SET TUBE SMOKE EVAC HIGH FLOW (TUBING) ×2 IMPLANT
SLEEVE Z-THREAD 5X100MM (TROCAR) IMPLANT
SPIKE FLUID TRANSFER (MISCELLANEOUS) ×2 IMPLANT
SUT ETHILON 5 0 PS 2 18 (SUTURE) IMPLANT
SUT MNCRL AB 4-0 PS2 18 (SUTURE) IMPLANT
SUT MON AB 5-0 PS2 18 (SUTURE) IMPLANT
SUT VIC AB 3-0 SH 27 (SUTURE) ×2
SUT VIC AB 3-0 SH 27X BRD (SUTURE) ×4 IMPLANT
SUT VICRYL 0 UR6 27IN ABS (SUTURE) ×4 IMPLANT
SUT VICRYL 4-0 PS2 18IN ABS (SUTURE) ×4 IMPLANT
TOWEL GREEN STERILE (TOWEL DISPOSABLE) ×2 IMPLANT
TOWEL GREEN STERILE FF (TOWEL DISPOSABLE) ×2 IMPLANT
TRAY LAPAROSCOPIC MC (CUSTOM PROCEDURE TRAY) ×2 IMPLANT
TROCAR Z THREAD OPTICAL 12X100 (TROCAR) ×2 IMPLANT
WATER STERILE IRR 1000ML POUR (IV SOLUTION) ×2 IMPLANT

## 2022-08-08 NOTE — Anesthesia Postprocedure Evaluation (Signed)
Anesthesia Post Note  Patient: [name]  Procedure(s) Performed: REMOVAL CONTINUOUS AMBULATORY PERITONEAL DIALYSIS  (CAPD) CATHETER (Abdomen)     Patient location during evaluation: PACU Anesthesia Type: General Level of consciousness: awake and alert Pain management: pain level controlled Vital Signs Assessment: post-procedure vital signs reviewed and stable Respiratory status: spontaneous breathing, nonlabored ventilation, respiratory function stable and patient connected to nasal cannula oxygen Cardiovascular status: blood pressure returned to baseline and stable Postop Assessment: no apparent nausea or vomiting Anesthetic complications: no   No notable events documented.  Last Vitals:  Vitals:   08/08/22 0837 08/08/22 0845  BP: (!) 129/111 134/82  Pulse: 90   Resp: (!) 22   Temp: 36.7 C   SpO2: 99%     Last Pain:  Vitals:   08/08/22 0917  TempSrc:   PainSc: Asleep                 [name]

## 2022-08-08 NOTE — Transfer of Care (Signed)
Immediate Anesthesia Transfer of Care Note  Patient: [name]  Procedure(s) Performed: REMOVAL CONTINUOUS AMBULATORY PERITONEAL DIALYSIS  (CAPD) CATHETER (Abdomen)  Patient Location: PACU  Anesthesia Type:General  Level of Consciousness: awake, alert , and oriented  Airway & Oxygen Therapy: Patient Spontanous Breathing  Post-op Assessment: Report given to RN and Post -op Vital signs reviewed and stable  Post vital signs: Reviewed and stable  Last Vitals:  Vitals Value Taken Time  BP    Temp    Pulse    Resp    SpO2      Last Pain:  Vitals:   08/08/22 0604  TempSrc: Oral  PainSc:          Complications: No notable events documented.

## 2022-08-08 NOTE — Anesthesia Procedure Notes (Signed)
Procedure Name: Intubation Date/Time: 08/08/2022 7:38 AM  Performed by: Clearnce Sorrel, CRNAPre-anesthesia Checklist: Patient identified, Emergency Drugs available, Suction available and Patient being monitored Patient Re-evaluated:Patient Re-evaluated prior to induction Oxygen Delivery Method: Circle System Utilized Preoxygenation: Pre-oxygenation with 100% oxygen Induction Type: IV induction Ventilation: Mask ventilation without difficulty Laryngoscope Size: Mac and 4 Grade View: Grade I Tube type: Oral Tube size: 7.5 mm Number of attempts: 1 Airway Equipment and Method: Stylet and Oral airway Placement Confirmation: ETT inserted through vocal cords under direct vision, positive ETCO2 and breath sounds checked- equal and bilateral Secured at: 24 cm Tube secured with: Tape Dental Injury: Teeth and Oropharynx as per pre-operative assessment

## 2022-08-08 NOTE — H&P (Signed)
HPI:   [name] is a 47 y.o. male has a history of end-stage renal disease currently on dialysis via left femoral tunneled dialysis catheter which terminates in the infrarenal IVC.  Catheter is working but planning on exchange next week.  He is on Eliquis for previous catheter that thrombosed multiple times.  He has a pd catheter that was difficult to place and now is non functional.          Past Medical History:  Diagnosis Date   Acute respiratory failure with hypoxia ([place]) 11/30/2018   Anemia      ESRD   ESRD on hemodialysis ([place]) 06/07/2011    TTS [place]. Started HD in 2006, got transplant in 2014 lasted until Dec 2019 then went back on HD.  Has L thigh AVG as of Jun 2020.  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    Hypertension     Morbid obesity ([place])     Paroxysmal atrial fibrillation ([place])     Sepsis ([place]) 11/30/2018   Sleep apnea     Type 2 diabetes mellitus with other diabetic kidney complication ([place]) [id number]             Family History  Problem Relation Age of Onset   Diabetes Father     Stroke Father     Stroke Maternal Grandmother     Anesthesia problems Neg Hx               Past Surgical History:  Procedure Laterality Date   ARTERIOVENOUS GRAFT PLACEMENT   08/09/2010    Left Thigh Graft by Dr. Gae [name]   AV FISTULA PLACEMENT Right 05/18/2018    Procedure: INSERTION OF ARTERIOVENOUS (AV) GORE-TEX GRAFT Left THIGH;  Surgeon: Robert Sandy, MD;  Location: [facility];  Service: Vascular;  Laterality: Right;   AV FISTULA PLACEMENT Right 02/15/2021    Procedure: INSERTION OF ARTERIOVENOUS (AV) GORE-TEX LOOP GRAFT RIGHT THIGH;  Surgeon: Robert Sandy, MD;  Location: [facility];  Service: Vascular;  Laterality: Right;   CAPD REMOVAL   05/08/2011    Procedure: CONTINUOUS AMBULATORY PERITONEAL DIALYSIS  (CAPD) CATHETER REMOVAL;  Surgeon: Robert Blade, MD;  Location: [facility];  Service: General;  Laterality: N/A;  Removal of  CAPD catheter, Dr. requests to go after 100   INSERTION OF DIALYSIS CATHETER   10/05/2010    Right Femoral Cath insertion by Dr. Adele [name].  Pt ahas had several caths inserted.   INSERTION OF DIALYSIS CATHETER Right 02/15/2021    Procedure: Attempted INSERTION OF RIGHT and Left Internal Jugular DIALYSIS CATHETER, Insertion of Left Femoral Vein Dialysis Catheter;  Surgeon: Robert Sandy, MD;  Location: [place] [facility];  Service: Vascular;  Laterality: Right;   INSERTION OF DIALYSIS CATHETER Right 04/26/2021    Procedure: INSERTION OF TUNNELED 55cm PALIDROME PRECISION CHRONIC DIALYSIS CATHETER;  Surgeon: Robert Robins, MD;  Location: [facility];  Service: Vascular;  Laterality: Right;   INSERTION OF DIALYSIS CATHETER Left 12/26/2021    Procedure: INSERTION OF TUNNELED  DIALYSIS CATHETER LEFT FEMORAL ARTERY;  Surgeon: Robert Sandy, MD;  Location: [facility];  Service: Vascular;  Laterality: Left;   IR FLUORO GUIDE CV LINE RIGHT   05/11/2018   IR FLUORO GUIDE CV LINE RIGHT   07/03/2021   IR FLUORO GUIDE CV LINE RIGHT   07/16/2021   IR PTA ADDL CENTRAL DIALYSIS SEG THRU DIALY CIRCUIT RIGHT Right 07/03/2021   IR US GUIDE  VASC ACCESS RIGHT   05/11/2018   IR VENOCAVAGRAM IVC   07/16/2021   KIDNEY TRANSPLANT   2014   KNEE ARTHROSCOPY Left     THROMBECTOMY W/ EMBOLECTOMY Right 04/26/2021    Procedure: REMOVAL OF RIGHT THIGH ARTERIOVENOUS GORE-TEX GRAFT AND REPAIR OF RIGHT COMMON FEMORAL ARTERY;  Surgeon: Robert Robins, MD;  Location: [facility];  Service: Vascular;  Laterality: Right;   UPPER EXTREMITY ANGIOGRAPHY Bilateral 05/13/2018    Procedure: UPPER EXTREMITY ANGIOGRAPHY - bilarteral;  Surgeon: Robert Heck, MD;  Location: [facility];  Service: Cardiovascular;  Laterality: Bilateral;      Short Social History:  Social History             Tobacco Use   Smoking status: Former      Types: Cigarettes      Quit date: 11/08/2015      Years since quitting: 6.2   Smokeless tobacco:  Never   Tobacco comments:      Smokes a cigarette about every 3-4 days.  Substance Use Topics   Alcohol use: Yes      Comment: Socially      No Known Allergies              Current Outpatient Medications  Medication Sig Dispense Refill   apixaban (ELIQUIS) 5 MG TABS tablet Take 5 mg by mouth 2 (two) times daily.       atorvastatin (LIPITOR) 40 MG tablet Take 1 tablet (40 mg total) by mouth daily. 90 tablet 3   B Complex-C-Zn-Folic Acid (DIALYVITE/ZINC) TABS         ferric citrate (AURYXIA) 1 GM 210 MG(Fe) tablet Take 630 mg by mouth 3 (three) times daily with meals.        No current facility-administered medications for this visit.      Review of Systems  Constitutional:  Constitutional negative. HENT: HENT negative.  Eyes: Eyes negative.  Respiratory: Respiratory negative.  Cardiovascular: Cardiovascular negative.  GI: Gastrointestinal negative.  Musculoskeletal: Musculoskeletal negative.  Skin: Skin negative.  Neurological: Neurological negative. Hematologic: Hematologic/lymphatic negative.  Psychiatric: Psychiatric negative.          Objective:    Vitals:   08/08/22 0604  BP: (!) 88/57  Pulse: 93  Resp: 18  Temp: 99.1 F (37.3 C)  SpO2: 98%      Physical Exam HENT:     Nose: Nose normal.  Eyes:     Pupils: Pupils are equal, round, and reactive to light.  Pulmonary:     Effort: Pulmonary effort is normal.  Abdominal:     General: Abdomen is flat.     Palpations: Abdomen is soft.  Neurological:     General: No focal deficit present.     Mental Status: He is alert.           Assessment/Plan:    47 year old male currently the left femoral tunneled dialysis catheter that unfortunately terminates in his IVC and then the IVC is occluded above this and we will consider this at his last access option for hemodialysis other than lumbar access to be placed possibly by interventional radiology.  he has had a pd catheter but is non functional. Plan for pd  catheter removal today in OR.      Robert Spelman C. Donzetta Matters, MD [facility] Office: [phone] Pager: [phone]

## 2022-08-08 NOTE — Op Note (Signed)
    Patient name: [name] MRN: [id number] DOB: 06/25/1975 Sex: male  08/08/2022 Pre-operative Diagnosis: ESRD Post-operative diagnosis:  Same Surgeon:  Erlene Quan C. Donzetta Matters, MD Assistant: Leontine Locket, PA Procedure Performed:  Removal of pd catheter    Indications: 47 year old gentleman with history of end-stage renal disease currently dialyzing via tunneled dialysis catheter that terminates in an occluded IVC.  He has undergone peritoneal dialysis catheter but unfortunately this is nonfunctional and he is indicated for removal.  Findings: Catheter was removed and all ports were identified.  Fascia was too deep for closure.   Procedure:  The patient was identified in the holding area and taken to the operating room where he was placed supine operative when general anesthesia was induced.  He was sterilely prepped and draped in the abdomen in usual fashion, antibiotics were minister timeout was called.  Ultrasound was used to identify the 3 cuffs on the catheter and 2 incisions were created.  We dissected out the cuffs entirely remove them from the fascia I elected not to close the fascia as this was too deep for identification.  Catheter was transected and all was removed.  All incisions were irrigated.  The 2 large incisions were closed with Vicryl Monocryl and Dermabond was placed at the skin level.  The exit site was closed with an interrupted Monocryl that will dissolve in the future.  He tolerated procedure without any complication.  All counts were correct at completion.  An experienced assistant was necessary to facilitate exposure and removal of the catheter.  EBL: 50cc  Shanvi Moyd C. Donzetta Matters, MD [facility] Office: [phone] Pager: [phone]

## 2022-08-09 ENCOUNTER — Encounter (HOSPITAL_COMMUNITY): Payer: Self-pay | Admitting: Vascular Surgery

## 2022-08-09 DIAGNOSIS — N186 End stage renal disease: Secondary | ICD-10-CM | POA: Diagnosis not present

## 2022-08-09 DIAGNOSIS — Z992 Dependence on renal dialysis: Secondary | ICD-10-CM | POA: Diagnosis not present

## 2022-08-09 DIAGNOSIS — N2581 Secondary hyperparathyroidism of renal origin: Secondary | ICD-10-CM | POA: Diagnosis not present

## 2022-08-11 DIAGNOSIS — I871 Compression of vein: Secondary | ICD-10-CM | POA: Diagnosis not present

## 2022-08-11 DIAGNOSIS — T8249XA Other complication of vascular dialysis catheter, initial encounter: Secondary | ICD-10-CM | POA: Diagnosis not present

## 2022-08-11 DIAGNOSIS — Z992 Dependence on renal dialysis: Secondary | ICD-10-CM | POA: Diagnosis not present

## 2022-08-11 DIAGNOSIS — N186 End stage renal disease: Secondary | ICD-10-CM | POA: Diagnosis not present

## 2022-08-12 DIAGNOSIS — N2581 Secondary hyperparathyroidism of renal origin: Secondary | ICD-10-CM | POA: Diagnosis not present

## 2022-08-12 DIAGNOSIS — Z992 Dependence on renal dialysis: Secondary | ICD-10-CM | POA: Diagnosis not present

## 2022-08-12 DIAGNOSIS — N186 End stage renal disease: Secondary | ICD-10-CM | POA: Diagnosis not present

## 2022-08-14 DIAGNOSIS — N2581 Secondary hyperparathyroidism of renal origin: Secondary | ICD-10-CM | POA: Diagnosis not present

## 2022-08-14 DIAGNOSIS — N186 End stage renal disease: Secondary | ICD-10-CM | POA: Diagnosis not present

## 2022-08-14 DIAGNOSIS — Z992 Dependence on renal dialysis: Secondary | ICD-10-CM | POA: Diagnosis not present

## 2022-08-16 DIAGNOSIS — Z992 Dependence on renal dialysis: Secondary | ICD-10-CM | POA: Diagnosis not present

## 2022-08-16 DIAGNOSIS — N2581 Secondary hyperparathyroidism of renal origin: Secondary | ICD-10-CM | POA: Diagnosis not present

## 2022-08-16 DIAGNOSIS — N186 End stage renal disease: Secondary | ICD-10-CM | POA: Diagnosis not present

## 2022-08-19 DIAGNOSIS — N2581 Secondary hyperparathyroidism of renal origin: Secondary | ICD-10-CM | POA: Diagnosis not present

## 2022-08-19 DIAGNOSIS — Z992 Dependence on renal dialysis: Secondary | ICD-10-CM | POA: Diagnosis not present

## 2022-08-19 DIAGNOSIS — N186 End stage renal disease: Secondary | ICD-10-CM | POA: Diagnosis not present

## 2022-08-21 DIAGNOSIS — N186 End stage renal disease: Secondary | ICD-10-CM | POA: Diagnosis not present

## 2022-08-21 DIAGNOSIS — N2581 Secondary hyperparathyroidism of renal origin: Secondary | ICD-10-CM | POA: Diagnosis not present

## 2022-08-21 DIAGNOSIS — Z992 Dependence on renal dialysis: Secondary | ICD-10-CM | POA: Diagnosis not present

## 2022-08-23 DIAGNOSIS — Z992 Dependence on renal dialysis: Secondary | ICD-10-CM | POA: Diagnosis not present

## 2022-08-23 DIAGNOSIS — N2581 Secondary hyperparathyroidism of renal origin: Secondary | ICD-10-CM | POA: Diagnosis not present

## 2022-08-23 DIAGNOSIS — N186 End stage renal disease: Secondary | ICD-10-CM | POA: Diagnosis not present

## 2022-08-26 DIAGNOSIS — N186 End stage renal disease: Secondary | ICD-10-CM | POA: Diagnosis not present

## 2022-08-26 DIAGNOSIS — Z992 Dependence on renal dialysis: Secondary | ICD-10-CM | POA: Diagnosis not present

## 2022-08-26 DIAGNOSIS — N2581 Secondary hyperparathyroidism of renal origin: Secondary | ICD-10-CM | POA: Diagnosis not present

## 2022-08-28 DIAGNOSIS — N2581 Secondary hyperparathyroidism of renal origin: Secondary | ICD-10-CM | POA: Diagnosis not present

## 2022-08-28 DIAGNOSIS — N186 End stage renal disease: Secondary | ICD-10-CM | POA: Diagnosis not present

## 2022-08-28 DIAGNOSIS — Z992 Dependence on renal dialysis: Secondary | ICD-10-CM | POA: Diagnosis not present

## 2022-08-30 DIAGNOSIS — N2581 Secondary hyperparathyroidism of renal origin: Secondary | ICD-10-CM | POA: Diagnosis not present

## 2022-08-30 DIAGNOSIS — Z992 Dependence on renal dialysis: Secondary | ICD-10-CM | POA: Diagnosis not present

## 2022-08-30 DIAGNOSIS — N186 End stage renal disease: Secondary | ICD-10-CM | POA: Diagnosis not present

## 2022-09-02 DIAGNOSIS — Z992 Dependence on renal dialysis: Secondary | ICD-10-CM | POA: Diagnosis not present

## 2022-09-02 DIAGNOSIS — N2581 Secondary hyperparathyroidism of renal origin: Secondary | ICD-10-CM | POA: Diagnosis not present

## 2022-09-02 DIAGNOSIS — N186 End stage renal disease: Secondary | ICD-10-CM | POA: Diagnosis not present

## 2022-09-04 DIAGNOSIS — N2581 Secondary hyperparathyroidism of renal origin: Secondary | ICD-10-CM | POA: Diagnosis not present

## 2022-09-04 DIAGNOSIS — Z992 Dependence on renal dialysis: Secondary | ICD-10-CM | POA: Diagnosis not present

## 2022-09-04 DIAGNOSIS — N186 End stage renal disease: Secondary | ICD-10-CM | POA: Diagnosis not present

## 2022-09-06 DIAGNOSIS — Z992 Dependence on renal dialysis: Secondary | ICD-10-CM | POA: Diagnosis not present

## 2022-09-06 DIAGNOSIS — N186 End stage renal disease: Secondary | ICD-10-CM | POA: Diagnosis not present

## 2022-09-06 DIAGNOSIS — N2581 Secondary hyperparathyroidism of renal origin: Secondary | ICD-10-CM | POA: Diagnosis not present

## 2022-09-09 DIAGNOSIS — T8249XA Other complication of vascular dialysis catheter, initial encounter: Secondary | ICD-10-CM | POA: Diagnosis not present

## 2022-09-09 DIAGNOSIS — N186 End stage renal disease: Secondary | ICD-10-CM | POA: Diagnosis not present

## 2022-09-09 DIAGNOSIS — Z992 Dependence on renal dialysis: Secondary | ICD-10-CM | POA: Diagnosis not present

## 2022-09-10 DIAGNOSIS — Z992 Dependence on renal dialysis: Secondary | ICD-10-CM | POA: Diagnosis not present

## 2022-09-10 DIAGNOSIS — N2581 Secondary hyperparathyroidism of renal origin: Secondary | ICD-10-CM | POA: Diagnosis not present

## 2022-09-10 DIAGNOSIS — N186 End stage renal disease: Secondary | ICD-10-CM | POA: Diagnosis not present

## 2022-09-11 DIAGNOSIS — Z992 Dependence on renal dialysis: Secondary | ICD-10-CM | POA: Diagnosis not present

## 2022-09-11 DIAGNOSIS — N186 End stage renal disease: Secondary | ICD-10-CM | POA: Diagnosis not present

## 2022-09-11 DIAGNOSIS — N2581 Secondary hyperparathyroidism of renal origin: Secondary | ICD-10-CM | POA: Diagnosis not present

## 2022-09-13 DIAGNOSIS — N186 End stage renal disease: Secondary | ICD-10-CM | POA: Diagnosis not present

## 2022-09-13 DIAGNOSIS — Z992 Dependence on renal dialysis: Secondary | ICD-10-CM | POA: Diagnosis not present

## 2022-09-13 DIAGNOSIS — N2581 Secondary hyperparathyroidism of renal origin: Secondary | ICD-10-CM | POA: Diagnosis not present

## 2022-09-15 ENCOUNTER — Ambulatory Visit: Payer: Self-pay

## 2022-09-15 NOTE — Patient Outreach (Signed)
  Care Coordination   Follow Up Visit Note   09/15/2022 Name: [name] MRN: [id number] DOB: [birth year]  [name] is a 47 y.o. year old male who sees Rudd, Bertram Millard, MD for primary care. I spoke with  [name] by phone today.  What matters to the patients health and wellness today?  Health Maintenance    Goals Addressed             This Visit's Progress    Health Maintainance       Care Coordination Interventions: Evaluation of current treatment plan related to ESRD management and patient's adherence to plan as established by provider Discussed plans with patient for ongoing care management follow up and provided patient with direct contact information for care management team  Patient reports doing okay. Currently doing hemodialysis.  Patient currently has femoral port. No concerns.   Interventions Today    Flowsheet Row Most Recent Value  Chronic Disease   Chronic disease during today's visit Chronic Kidney Disease/End Stage Renal Disease (ESRD)  General Interventions   General Interventions Discussed/Reviewed General Interventions Reviewed  Education Interventions   Education Provided Provided Education  Provided Verbal Education On Nutrition, Blood Sugar Monitoring, Other  [Discussed dialysis access as patient has femoral port at this time]  Safety Interventions   Safety Discussed/Reviewed Safety Discussed             SDOH assessments and interventions completed:  Yes  SDOH Interventions Today    Flowsheet Row Most Recent Value  SDOH Interventions   Food Insecurity Interventions Intervention Not Indicated  Transportation Interventions Intervention Not Indicated        Care Coordination Interventions:  Yes, provided   Follow up plan: Follow up call scheduled for June    Encounter Outcome:  Pt. Visit Completed   Bary Leriche, RN, MSN [facility] Care Management Care Management Coordinator Direct Line [phone]

## 2022-09-15 NOTE — Patient Instructions (Signed)
Visit Information  Thank you for taking time to visit with me today. Please don't hesitate to contact me if I can be of assistance to you.   Following are the goals we discussed today:   Goals Addressed             This Visit's Progress    Health Maintainance       Care Coordination Interventions: Evaluation of current treatment plan related to ESRD management and patient's adherence to plan as established by provider Discussed plans with patient for ongoing care management follow up and provided patient with direct contact information for care management team  Patient reports doing okay. Currently doing hemodialysis.  Patient currently has femoral port. No concerns.   Interventions Today    Flowsheet Row Most Recent Value  Chronic Disease   Chronic disease during today's visit Chronic Kidney Disease/End Stage Renal Disease (ESRD)  General Interventions   General Interventions Discussed/Reviewed General Interventions Reviewed  Education Interventions   Education Provided Provided Education  Provided Verbal Education On Nutrition, Blood Sugar Monitoring, Other  [Discussed dialysis access as patient has femoral port at this time]  Safety Interventions   Safety Discussed/Reviewed Safety Discussed             Our next appointment is by telephone on 11/17/22 at 1030  Please call the care guide team at [phone] if you need to cancel or reschedule your appointment.   If you are experiencing a Mental Health or Behavioral Health Crisis or need someone to talk to, please call the Suicide and Crisis Lifeline: 988   Patient verbalizes understanding of instructions and care plan provided today and agrees to view in MyChart. Active MyChart status and patient understanding of how to access instructions and care plan via MyChart confirmed with patient.     The patient has been provided with contact information for the care management team and has been advised to call with any health  related questions or concerns.   Bary Leriche, RN, MSN [facility] Care Management Care Management Coordinator Direct Line [phone]

## 2022-09-16 DIAGNOSIS — Z992 Dependence on renal dialysis: Secondary | ICD-10-CM | POA: Diagnosis not present

## 2022-09-16 DIAGNOSIS — N2581 Secondary hyperparathyroidism of renal origin: Secondary | ICD-10-CM | POA: Diagnosis not present

## 2022-09-16 DIAGNOSIS — N186 End stage renal disease: Secondary | ICD-10-CM | POA: Diagnosis not present

## 2022-09-18 DIAGNOSIS — N186 End stage renal disease: Secondary | ICD-10-CM | POA: Diagnosis not present

## 2022-09-18 DIAGNOSIS — Z992 Dependence on renal dialysis: Secondary | ICD-10-CM | POA: Diagnosis not present

## 2022-09-18 DIAGNOSIS — N2581 Secondary hyperparathyroidism of renal origin: Secondary | ICD-10-CM | POA: Diagnosis not present

## 2022-09-20 DIAGNOSIS — N2581 Secondary hyperparathyroidism of renal origin: Secondary | ICD-10-CM | POA: Diagnosis not present

## 2022-09-20 DIAGNOSIS — N186 End stage renal disease: Secondary | ICD-10-CM | POA: Diagnosis not present

## 2022-09-20 DIAGNOSIS — Z992 Dependence on renal dialysis: Secondary | ICD-10-CM | POA: Diagnosis not present

## 2022-09-23 DIAGNOSIS — Z992 Dependence on renal dialysis: Secondary | ICD-10-CM | POA: Diagnosis not present

## 2022-09-23 DIAGNOSIS — N2581 Secondary hyperparathyroidism of renal origin: Secondary | ICD-10-CM | POA: Diagnosis not present

## 2022-09-23 DIAGNOSIS — N186 End stage renal disease: Secondary | ICD-10-CM | POA: Diagnosis not present

## 2022-09-25 DIAGNOSIS — Z992 Dependence on renal dialysis: Secondary | ICD-10-CM | POA: Diagnosis not present

## 2022-09-25 DIAGNOSIS — N2581 Secondary hyperparathyroidism of renal origin: Secondary | ICD-10-CM | POA: Diagnosis not present

## 2022-09-25 DIAGNOSIS — N186 End stage renal disease: Secondary | ICD-10-CM | POA: Diagnosis not present

## 2022-09-27 DIAGNOSIS — N2581 Secondary hyperparathyroidism of renal origin: Secondary | ICD-10-CM | POA: Diagnosis not present

## 2022-09-27 DIAGNOSIS — Z992 Dependence on renal dialysis: Secondary | ICD-10-CM | POA: Diagnosis not present

## 2022-09-27 DIAGNOSIS — N186 End stage renal disease: Secondary | ICD-10-CM | POA: Diagnosis not present

## 2022-09-30 DIAGNOSIS — Z992 Dependence on renal dialysis: Secondary | ICD-10-CM | POA: Diagnosis not present

## 2022-09-30 DIAGNOSIS — N186 End stage renal disease: Secondary | ICD-10-CM | POA: Diagnosis not present

## 2022-09-30 DIAGNOSIS — N2581 Secondary hyperparathyroidism of renal origin: Secondary | ICD-10-CM | POA: Diagnosis not present

## 2022-10-02 ENCOUNTER — Telehealth: Payer: Self-pay | Admitting: Family Medicine

## 2022-10-02 DIAGNOSIS — Z992 Dependence on renal dialysis: Secondary | ICD-10-CM | POA: Diagnosis not present

## 2022-10-02 DIAGNOSIS — N2581 Secondary hyperparathyroidism of renal origin: Secondary | ICD-10-CM | POA: Diagnosis not present

## 2022-10-02 DIAGNOSIS — N186 End stage renal disease: Secondary | ICD-10-CM | POA: Diagnosis not present

## 2022-10-02 NOTE — Telephone Encounter (Signed)
Called patient to schedule Medicare Annual Wellness Visit (AWV). Left message for patient to call back and schedule Medicare Annual Wellness Visit (AWV).  Last date of AWV: 08/14/21  Please schedule an appointment at any time with [facility].  If any questions, please contact me at [phone].  Thank you ,  Rudell Cobb AWV direct phone # [phone]

## 2022-10-04 DIAGNOSIS — Z992 Dependence on renal dialysis: Secondary | ICD-10-CM | POA: Diagnosis not present

## 2022-10-04 DIAGNOSIS — N2581 Secondary hyperparathyroidism of renal origin: Secondary | ICD-10-CM | POA: Diagnosis not present

## 2022-10-04 DIAGNOSIS — N186 End stage renal disease: Secondary | ICD-10-CM | POA: Diagnosis not present

## 2022-10-07 DIAGNOSIS — N041 Nephrotic syndrome with focal and segmental glomerular lesions: Secondary | ICD-10-CM | POA: Diagnosis not present

## 2022-10-07 DIAGNOSIS — N186 End stage renal disease: Secondary | ICD-10-CM | POA: Diagnosis not present

## 2022-10-07 DIAGNOSIS — Z992 Dependence on renal dialysis: Secondary | ICD-10-CM | POA: Diagnosis not present

## 2022-10-07 DIAGNOSIS — N2581 Secondary hyperparathyroidism of renal origin: Secondary | ICD-10-CM | POA: Diagnosis not present

## 2022-10-08 DIAGNOSIS — Z992 Dependence on renal dialysis: Secondary | ICD-10-CM | POA: Diagnosis not present

## 2022-10-08 DIAGNOSIS — I871 Compression of vein: Secondary | ICD-10-CM | POA: Diagnosis not present

## 2022-10-08 DIAGNOSIS — N186 End stage renal disease: Secondary | ICD-10-CM | POA: Diagnosis not present

## 2022-10-09 DIAGNOSIS — N2581 Secondary hyperparathyroidism of renal origin: Secondary | ICD-10-CM | POA: Diagnosis not present

## 2022-10-09 DIAGNOSIS — Z992 Dependence on renal dialysis: Secondary | ICD-10-CM | POA: Diagnosis not present

## 2022-10-09 DIAGNOSIS — N186 End stage renal disease: Secondary | ICD-10-CM | POA: Diagnosis not present

## 2022-10-11 ENCOUNTER — Other Ambulatory Visit: Payer: Self-pay | Admitting: Family Medicine

## 2022-10-11 DIAGNOSIS — N186 End stage renal disease: Secondary | ICD-10-CM | POA: Diagnosis not present

## 2022-10-11 DIAGNOSIS — E782 Mixed hyperlipidemia: Secondary | ICD-10-CM

## 2022-10-11 DIAGNOSIS — Z992 Dependence on renal dialysis: Secondary | ICD-10-CM | POA: Diagnosis not present

## 2022-10-11 DIAGNOSIS — N2581 Secondary hyperparathyroidism of renal origin: Secondary | ICD-10-CM | POA: Diagnosis not present

## 2022-10-14 DIAGNOSIS — N2581 Secondary hyperparathyroidism of renal origin: Secondary | ICD-10-CM | POA: Diagnosis not present

## 2022-10-14 DIAGNOSIS — N186 End stage renal disease: Secondary | ICD-10-CM | POA: Diagnosis not present

## 2022-10-14 DIAGNOSIS — Z992 Dependence on renal dialysis: Secondary | ICD-10-CM | POA: Diagnosis not present

## 2022-10-16 DIAGNOSIS — N186 End stage renal disease: Secondary | ICD-10-CM | POA: Diagnosis not present

## 2022-10-16 DIAGNOSIS — Z992 Dependence on renal dialysis: Secondary | ICD-10-CM | POA: Diagnosis not present

## 2022-10-16 DIAGNOSIS — N2581 Secondary hyperparathyroidism of renal origin: Secondary | ICD-10-CM | POA: Diagnosis not present

## 2022-10-18 DIAGNOSIS — N2581 Secondary hyperparathyroidism of renal origin: Secondary | ICD-10-CM | POA: Diagnosis not present

## 2022-10-18 DIAGNOSIS — N186 End stage renal disease: Secondary | ICD-10-CM | POA: Diagnosis not present

## 2022-10-18 DIAGNOSIS — Z992 Dependence on renal dialysis: Secondary | ICD-10-CM | POA: Diagnosis not present

## 2022-10-21 DIAGNOSIS — N2581 Secondary hyperparathyroidism of renal origin: Secondary | ICD-10-CM | POA: Diagnosis not present

## 2022-10-21 DIAGNOSIS — N186 End stage renal disease: Secondary | ICD-10-CM | POA: Diagnosis not present

## 2022-10-21 DIAGNOSIS — Z992 Dependence on renal dialysis: Secondary | ICD-10-CM | POA: Diagnosis not present

## 2022-10-23 DIAGNOSIS — N2581 Secondary hyperparathyroidism of renal origin: Secondary | ICD-10-CM | POA: Diagnosis not present

## 2022-10-23 DIAGNOSIS — Z992 Dependence on renal dialysis: Secondary | ICD-10-CM | POA: Diagnosis not present

## 2022-10-23 DIAGNOSIS — N186 End stage renal disease: Secondary | ICD-10-CM | POA: Diagnosis not present

## 2022-10-25 DIAGNOSIS — Z992 Dependence on renal dialysis: Secondary | ICD-10-CM | POA: Diagnosis not present

## 2022-10-25 DIAGNOSIS — N2581 Secondary hyperparathyroidism of renal origin: Secondary | ICD-10-CM | POA: Diagnosis not present

## 2022-10-25 DIAGNOSIS — N186 End stage renal disease: Secondary | ICD-10-CM | POA: Diagnosis not present

## 2022-10-28 DIAGNOSIS — N186 End stage renal disease: Secondary | ICD-10-CM | POA: Diagnosis not present

## 2022-10-28 DIAGNOSIS — Z992 Dependence on renal dialysis: Secondary | ICD-10-CM | POA: Diagnosis not present

## 2022-10-28 DIAGNOSIS — N2581 Secondary hyperparathyroidism of renal origin: Secondary | ICD-10-CM | POA: Diagnosis not present

## 2022-10-30 DIAGNOSIS — N186 End stage renal disease: Secondary | ICD-10-CM | POA: Diagnosis not present

## 2022-10-30 DIAGNOSIS — Z992 Dependence on renal dialysis: Secondary | ICD-10-CM | POA: Diagnosis not present

## 2022-10-30 DIAGNOSIS — N2581 Secondary hyperparathyroidism of renal origin: Secondary | ICD-10-CM | POA: Diagnosis not present

## 2022-11-01 DIAGNOSIS — Z992 Dependence on renal dialysis: Secondary | ICD-10-CM | POA: Diagnosis not present

## 2022-11-01 DIAGNOSIS — N186 End stage renal disease: Secondary | ICD-10-CM | POA: Diagnosis not present

## 2022-11-01 DIAGNOSIS — N2581 Secondary hyperparathyroidism of renal origin: Secondary | ICD-10-CM | POA: Diagnosis not present

## 2022-11-04 DIAGNOSIS — N2581 Secondary hyperparathyroidism of renal origin: Secondary | ICD-10-CM | POA: Diagnosis not present

## 2022-11-04 DIAGNOSIS — Z992 Dependence on renal dialysis: Secondary | ICD-10-CM | POA: Diagnosis not present

## 2022-11-04 DIAGNOSIS — N186 End stage renal disease: Secondary | ICD-10-CM | POA: Diagnosis not present

## 2022-11-07 DIAGNOSIS — N2581 Secondary hyperparathyroidism of renal origin: Secondary | ICD-10-CM | POA: Diagnosis not present

## 2022-11-07 DIAGNOSIS — Z992 Dependence on renal dialysis: Secondary | ICD-10-CM | POA: Diagnosis not present

## 2022-11-07 DIAGNOSIS — N041 Nephrotic syndrome with focal and segmental glomerular lesions: Secondary | ICD-10-CM | POA: Diagnosis not present

## 2022-11-07 DIAGNOSIS — N186 End stage renal disease: Secondary | ICD-10-CM | POA: Diagnosis not present

## 2022-11-08 DIAGNOSIS — Z992 Dependence on renal dialysis: Secondary | ICD-10-CM | POA: Diagnosis not present

## 2022-11-08 DIAGNOSIS — N2581 Secondary hyperparathyroidism of renal origin: Secondary | ICD-10-CM | POA: Diagnosis not present

## 2022-11-08 DIAGNOSIS — E8770 Fluid overload, unspecified: Secondary | ICD-10-CM | POA: Diagnosis not present

## 2022-11-08 DIAGNOSIS — N186 End stage renal disease: Secondary | ICD-10-CM | POA: Diagnosis not present

## 2022-11-11 DIAGNOSIS — N186 End stage renal disease: Secondary | ICD-10-CM | POA: Diagnosis not present

## 2022-11-11 DIAGNOSIS — N2581 Secondary hyperparathyroidism of renal origin: Secondary | ICD-10-CM | POA: Diagnosis not present

## 2022-11-11 DIAGNOSIS — Z992 Dependence on renal dialysis: Secondary | ICD-10-CM | POA: Diagnosis not present

## 2022-11-11 DIAGNOSIS — E8770 Fluid overload, unspecified: Secondary | ICD-10-CM | POA: Diagnosis not present

## 2022-11-12 DIAGNOSIS — Z992 Dependence on renal dialysis: Secondary | ICD-10-CM | POA: Diagnosis not present

## 2022-11-12 DIAGNOSIS — I82291 Chronic embolism and thrombosis of other thoracic veins: Secondary | ICD-10-CM | POA: Diagnosis not present

## 2022-11-12 DIAGNOSIS — I82210 Acute embolism and thrombosis of superior vena cava: Secondary | ICD-10-CM | POA: Diagnosis not present

## 2022-11-12 DIAGNOSIS — R1110 Vomiting, unspecified: Secondary | ICD-10-CM | POA: Diagnosis not present

## 2022-11-12 DIAGNOSIS — N186 End stage renal disease: Secondary | ICD-10-CM | POA: Diagnosis not present

## 2022-11-13 DIAGNOSIS — Z992 Dependence on renal dialysis: Secondary | ICD-10-CM | POA: Diagnosis not present

## 2022-11-13 DIAGNOSIS — N2581 Secondary hyperparathyroidism of renal origin: Secondary | ICD-10-CM | POA: Diagnosis not present

## 2022-11-13 DIAGNOSIS — N186 End stage renal disease: Secondary | ICD-10-CM | POA: Diagnosis not present

## 2022-11-13 DIAGNOSIS — E8770 Fluid overload, unspecified: Secondary | ICD-10-CM | POA: Diagnosis not present

## 2022-11-17 ENCOUNTER — Ambulatory Visit: Payer: Self-pay

## 2022-11-17 DIAGNOSIS — N186 End stage renal disease: Secondary | ICD-10-CM | POA: Diagnosis not present

## 2022-11-17 DIAGNOSIS — Z992 Dependence on renal dialysis: Secondary | ICD-10-CM | POA: Diagnosis not present

## 2022-11-17 NOTE — Patient Outreach (Signed)
  Care Coordination   11/17/2022 Name: [name] MRN: [id number] DOB: Jun 14, 1975   Care Coordination Outreach Attempts:  An unsuccessful telephone outreach was attempted today to offer the patient information about available care coordination services.  Follow Up Plan:  Additional outreach attempts will be made to offer the patient care coordination information and services.   Encounter Outcome:  No Answer   Care Coordination Interventions:  No, not indicated    Bary Leriche, RN, MSN [facility] Care Management Care Management Coordinator Direct Line [phone]

## 2022-11-18 DIAGNOSIS — N2581 Secondary hyperparathyroidism of renal origin: Secondary | ICD-10-CM | POA: Diagnosis not present

## 2022-11-18 DIAGNOSIS — Z992 Dependence on renal dialysis: Secondary | ICD-10-CM | POA: Diagnosis not present

## 2022-11-18 DIAGNOSIS — N186 End stage renal disease: Secondary | ICD-10-CM | POA: Diagnosis not present

## 2022-11-18 DIAGNOSIS — E8770 Fluid overload, unspecified: Secondary | ICD-10-CM | POA: Diagnosis not present

## 2022-11-19 DIAGNOSIS — E8770 Fluid overload, unspecified: Secondary | ICD-10-CM | POA: Diagnosis not present

## 2022-11-19 DIAGNOSIS — Z992 Dependence on renal dialysis: Secondary | ICD-10-CM | POA: Diagnosis not present

## 2022-11-19 DIAGNOSIS — N186 End stage renal disease: Secondary | ICD-10-CM | POA: Diagnosis not present

## 2022-11-19 DIAGNOSIS — N2581 Secondary hyperparathyroidism of renal origin: Secondary | ICD-10-CM | POA: Diagnosis not present

## 2022-11-20 DIAGNOSIS — N186 End stage renal disease: Secondary | ICD-10-CM | POA: Diagnosis not present

## 2022-11-20 DIAGNOSIS — Z992 Dependence on renal dialysis: Secondary | ICD-10-CM | POA: Diagnosis not present

## 2022-11-20 DIAGNOSIS — E8770 Fluid overload, unspecified: Secondary | ICD-10-CM | POA: Diagnosis not present

## 2022-11-20 DIAGNOSIS — N2581 Secondary hyperparathyroidism of renal origin: Secondary | ICD-10-CM | POA: Diagnosis not present

## 2022-11-22 DIAGNOSIS — E8770 Fluid overload, unspecified: Secondary | ICD-10-CM | POA: Diagnosis not present

## 2022-11-22 DIAGNOSIS — Z992 Dependence on renal dialysis: Secondary | ICD-10-CM | POA: Diagnosis not present

## 2022-11-22 DIAGNOSIS — N186 End stage renal disease: Secondary | ICD-10-CM | POA: Diagnosis not present

## 2022-11-22 DIAGNOSIS — N2581 Secondary hyperparathyroidism of renal origin: Secondary | ICD-10-CM | POA: Diagnosis not present

## 2022-11-25 ENCOUNTER — Telehealth: Payer: Self-pay | Admitting: *Deleted

## 2022-11-25 DIAGNOSIS — E8770 Fluid overload, unspecified: Secondary | ICD-10-CM | POA: Diagnosis not present

## 2022-11-25 DIAGNOSIS — N186 End stage renal disease: Secondary | ICD-10-CM | POA: Diagnosis not present

## 2022-11-25 DIAGNOSIS — N2581 Secondary hyperparathyroidism of renal origin: Secondary | ICD-10-CM | POA: Diagnosis not present

## 2022-11-25 DIAGNOSIS — Z992 Dependence on renal dialysis: Secondary | ICD-10-CM | POA: Diagnosis not present

## 2022-11-25 NOTE — Progress Notes (Signed)
  Care Coordination Note  11/25/2022 Name: [name] MRN: [id number] DOB: 1975-11-14  [name] is a 47 y.o. year old male who is a primary care patient of [name], MD and is actively engaged with the care management team. I reached out to [employer] by phone today to assist with re-scheduling a follow up visit with the RN Case Manager  Follow up plan: Unsuccessful telephone outreach attempt made. A HIPAA compliant phone message was left for the patient providing contact information and requesting a return call.   Burman Nieves, CCMA Care Coordination Care Guide Direct Dial: [phone]

## 2022-11-25 NOTE — Progress Notes (Signed)
  Care Coordination Note  11/25/2022 Name: [name] MRN: [id number] DOB: February 17, 1976  [name] is a 47 y.o. year old male who is a primary care patient of [name], MD and is actively engaged with the care management team. I reached out to Kathyrn Drown by phone today to assist with re-scheduling a follow up visit with the RN Case Manager  Follow up plan: Telephone appointment with care management team member scheduled for: 12/03/2022  [name], [name] Care Coordination Care Guide Direct Dial: [phone]

## 2022-11-27 DIAGNOSIS — N2581 Secondary hyperparathyroidism of renal origin: Secondary | ICD-10-CM | POA: Diagnosis not present

## 2022-11-27 DIAGNOSIS — N186 End stage renal disease: Secondary | ICD-10-CM | POA: Diagnosis not present

## 2022-11-27 DIAGNOSIS — Z992 Dependence on renal dialysis: Secondary | ICD-10-CM | POA: Diagnosis not present

## 2022-11-27 DIAGNOSIS — E8770 Fluid overload, unspecified: Secondary | ICD-10-CM | POA: Diagnosis not present

## 2022-11-29 DIAGNOSIS — N186 End stage renal disease: Secondary | ICD-10-CM | POA: Diagnosis not present

## 2022-11-29 DIAGNOSIS — E8770 Fluid overload, unspecified: Secondary | ICD-10-CM | POA: Diagnosis not present

## 2022-11-29 DIAGNOSIS — N2581 Secondary hyperparathyroidism of renal origin: Secondary | ICD-10-CM | POA: Diagnosis not present

## 2022-11-29 DIAGNOSIS — Z992 Dependence on renal dialysis: Secondary | ICD-10-CM | POA: Diagnosis not present

## 2022-12-02 DIAGNOSIS — N186 End stage renal disease: Secondary | ICD-10-CM | POA: Diagnosis not present

## 2022-12-02 DIAGNOSIS — E8770 Fluid overload, unspecified: Secondary | ICD-10-CM | POA: Diagnosis not present

## 2022-12-02 DIAGNOSIS — N2581 Secondary hyperparathyroidism of renal origin: Secondary | ICD-10-CM | POA: Diagnosis not present

## 2022-12-02 DIAGNOSIS — Z992 Dependence on renal dialysis: Secondary | ICD-10-CM | POA: Diagnosis not present

## 2022-12-03 ENCOUNTER — Ambulatory Visit: Payer: Self-pay

## 2022-12-03 NOTE — Patient Instructions (Signed)
Visit Information  Thank you for taking time to visit with me today. Please don't hesitate to contact me if I can be of assistance to you.   Following are the goals we discussed today:   Goals Addressed             This Visit's Progress    Health Maintainance       Care Coordination Interventions: Evaluation of current treatment plan related to ESRD management and patient's adherence to plan as established by provider Discussed plans with patient for ongoing care management follow up and provided patient with direct contact information for care management team  Patient reports doing okay. Continuing with hemodialysis.  No concerns.             Our next appointment is by telephone on 02/25/23 at 1100 am  Please call the care guide team at [phone] if you need to cancel or reschedule your appointment.   If you are experiencing a Mental Health or Behavioral Health Crisis or need someone to talk to, please call the Suicide and Crisis Lifeline: 988   Patient verbalizes understanding of instructions and care plan provided today and agrees to view in MyChart. Active MyChart status and patient understanding of how to access instructions and care plan via MyChart confirmed with patient.     The patient has been provided with contact information for the care management team and has been advised to call with any health related questions or concerns.   Bary Leriche, RN, MSN [facility] Care Management Care Management Coordinator Direct Line [phone]

## 2022-12-03 NOTE — Patient Outreach (Signed)
  Care Coordination   Follow Up Visit Note   12/03/2022 Name: CAHLIL SATTAR MRN: [id number] DOB: [birth year]  Verdene Lennert Gaus is a 47 y.o. year old male who sees Rudd, Bertram Millard, MD for primary care. I spoke with  Verdene Lennert Semel by phone today.  What matters to the patients health and wellness today?  Maintain health    Goals Addressed             This Visit's Progress    Health Maintainance       Care Coordination Interventions: Evaluation of current treatment plan related to ESRD management and patient's adherence to plan as established by provider Discussed plans with patient for ongoing care management follow up and provided patient with direct contact information for care management team  Patient reports doing okay. Continuing with hemodialysis.  No concerns.             SDOH assessments and interventions completed:  Yes  SDOH Interventions Today    Flowsheet Row Most Recent Value  SDOH Interventions   Housing Interventions Intervention Not Indicated  Transportation Interventions Intervention Not Indicated        Care Coordination Interventions:  Yes, provided   Follow up plan: Follow up call scheduled for September    Encounter Outcome:  Pt. Visit Completed    Bary Leriche, RN, MSN [facility] Care Management Care Management Coordinator Direct Line [phone]

## 2022-12-04 DIAGNOSIS — N186 End stage renal disease: Secondary | ICD-10-CM | POA: Diagnosis not present

## 2022-12-04 DIAGNOSIS — Z992 Dependence on renal dialysis: Secondary | ICD-10-CM | POA: Diagnosis not present

## 2022-12-04 DIAGNOSIS — E8770 Fluid overload, unspecified: Secondary | ICD-10-CM | POA: Diagnosis not present

## 2022-12-04 DIAGNOSIS — N2581 Secondary hyperparathyroidism of renal origin: Secondary | ICD-10-CM | POA: Diagnosis not present

## 2022-12-05 DIAGNOSIS — E8770 Fluid overload, unspecified: Secondary | ICD-10-CM | POA: Diagnosis not present

## 2022-12-05 DIAGNOSIS — N186 End stage renal disease: Secondary | ICD-10-CM | POA: Diagnosis not present

## 2022-12-05 DIAGNOSIS — N2581 Secondary hyperparathyroidism of renal origin: Secondary | ICD-10-CM | POA: Diagnosis not present

## 2022-12-05 DIAGNOSIS — Z992 Dependence on renal dialysis: Secondary | ICD-10-CM | POA: Diagnosis not present

## 2022-12-06 ENCOUNTER — Emergency Department (HOSPITAL_COMMUNITY): Payer: Medicare HMO

## 2022-12-06 ENCOUNTER — Inpatient Hospital Stay (HOSPITAL_COMMUNITY): Payer: Medicare HMO

## 2022-12-06 ENCOUNTER — Inpatient Hospital Stay (HOSPITAL_COMMUNITY)
Admission: EM | Admit: 2022-12-06 | Discharge: 2022-12-16 | DRG: 853 | Disposition: A | Discharge status: Home / Self Care | Payer: Medicare HMO | Attending: Internal Medicine | Admitting: Internal Medicine

## 2022-12-06 ENCOUNTER — Encounter (HOSPITAL_COMMUNITY): Payer: Self-pay

## 2022-12-06 DIAGNOSIS — N2581 Secondary hyperparathyroidism of renal origin: Secondary | ICD-10-CM | POA: Diagnosis not present

## 2022-12-06 DIAGNOSIS — I480 Paroxysmal atrial fibrillation: Secondary | ICD-10-CM | POA: Diagnosis not present

## 2022-12-06 DIAGNOSIS — I5032 Chronic diastolic (congestive) heart failure: Secondary | ICD-10-CM | POA: Diagnosis present

## 2022-12-06 DIAGNOSIS — M86171 Other acute osteomyelitis, right ankle and foot: Secondary | ICD-10-CM | POA: Diagnosis not present

## 2022-12-06 DIAGNOSIS — L03031 Cellulitis of right toe: Secondary | ICD-10-CM | POA: Diagnosis not present

## 2022-12-06 DIAGNOSIS — I2729 Other secondary pulmonary hypertension: Secondary | ICD-10-CM | POA: Diagnosis present

## 2022-12-06 DIAGNOSIS — Z87891 Personal history of nicotine dependence: Secondary | ICD-10-CM

## 2022-12-06 DIAGNOSIS — L089 Local infection of the skin and subcutaneous tissue, unspecified: Secondary | ICD-10-CM | POA: Diagnosis not present

## 2022-12-06 DIAGNOSIS — L97519 Non-pressure chronic ulcer of other part of right foot with unspecified severity: Secondary | ICD-10-CM

## 2022-12-06 DIAGNOSIS — L02619 Cutaneous abscess of unspecified foot: Secondary | ICD-10-CM | POA: Diagnosis not present

## 2022-12-06 DIAGNOSIS — L02611 Cutaneous abscess of right foot: Secondary | ICD-10-CM | POA: Diagnosis not present

## 2022-12-06 DIAGNOSIS — R5381 Other malaise: Secondary | ICD-10-CM | POA: Diagnosis present

## 2022-12-06 DIAGNOSIS — E1169 Type 2 diabetes mellitus with other specified complication: Secondary | ICD-10-CM | POA: Diagnosis present

## 2022-12-06 DIAGNOSIS — Z992 Dependence on renal dialysis: Secondary | ICD-10-CM

## 2022-12-06 DIAGNOSIS — N041 Nephrotic syndrome with focal and segmental glomerular lesions: Secondary | ICD-10-CM | POA: Diagnosis not present

## 2022-12-06 DIAGNOSIS — M25511 Pain in right shoulder: Secondary | ICD-10-CM | POA: Diagnosis not present

## 2022-12-06 DIAGNOSIS — E8770 Fluid overload, unspecified: Secondary | ICD-10-CM | POA: Diagnosis not present

## 2022-12-06 DIAGNOSIS — M7731 Calcaneal spur, right foot: Secondary | ICD-10-CM | POA: Diagnosis not present

## 2022-12-06 DIAGNOSIS — Z89411 Acquired absence of right great toe: Secondary | ICD-10-CM | POA: Diagnosis not present

## 2022-12-06 DIAGNOSIS — A419 Sepsis, unspecified organism: Secondary | ICD-10-CM | POA: Diagnosis not present

## 2022-12-06 DIAGNOSIS — Z833 Family history of diabetes mellitus: Secondary | ICD-10-CM

## 2022-12-06 DIAGNOSIS — E1122 Type 2 diabetes mellitus with diabetic chronic kidney disease: Secondary | ICD-10-CM | POA: Diagnosis present

## 2022-12-06 DIAGNOSIS — R000 Tachycardia, unspecified: Secondary | ICD-10-CM | POA: Diagnosis not present

## 2022-12-06 DIAGNOSIS — E872 Acidosis, unspecified: Secondary | ICD-10-CM | POA: Diagnosis present

## 2022-12-06 DIAGNOSIS — I444 Left anterior fascicular block: Secondary | ICD-10-CM | POA: Diagnosis present

## 2022-12-06 DIAGNOSIS — B957 Other staphylococcus as the cause of diseases classified elsewhere: Secondary | ICD-10-CM | POA: Diagnosis present

## 2022-12-06 DIAGNOSIS — I739 Peripheral vascular disease, unspecified: Secondary | ICD-10-CM | POA: Diagnosis not present

## 2022-12-06 DIAGNOSIS — M7989 Other specified soft tissue disorders: Secondary | ICD-10-CM | POA: Diagnosis not present

## 2022-12-06 DIAGNOSIS — Z89429 Acquired absence of other toe(s), unspecified side: Secondary | ICD-10-CM | POA: Diagnosis not present

## 2022-12-06 DIAGNOSIS — M86671 Other chronic osteomyelitis, right ankle and foot: Secondary | ICD-10-CM | POA: Diagnosis not present

## 2022-12-06 DIAGNOSIS — Z5329 Procedure and treatment not carried out because of patient's decision for other reasons: Secondary | ICD-10-CM | POA: Diagnosis present

## 2022-12-06 DIAGNOSIS — L03115 Cellulitis of right lower limb: Secondary | ICD-10-CM | POA: Diagnosis not present

## 2022-12-06 DIAGNOSIS — E1151 Type 2 diabetes mellitus with diabetic peripheral angiopathy without gangrene: Secondary | ICD-10-CM | POA: Diagnosis present

## 2022-12-06 DIAGNOSIS — I10 Essential (primary) hypertension: Secondary | ICD-10-CM | POA: Diagnosis present

## 2022-12-06 DIAGNOSIS — R6520 Severe sepsis without septic shock: Secondary | ICD-10-CM | POA: Diagnosis not present

## 2022-12-06 DIAGNOSIS — Z6841 Body Mass Index (BMI) 40.0 and over, adult: Secondary | ICD-10-CM

## 2022-12-06 DIAGNOSIS — B9562 Methicillin resistant Staphylococcus aureus infection as the cause of diseases classified elsewhere: Secondary | ICD-10-CM | POA: Diagnosis present

## 2022-12-06 DIAGNOSIS — Z940 Kidney transplant status: Secondary | ICD-10-CM | POA: Diagnosis not present

## 2022-12-06 DIAGNOSIS — E11621 Type 2 diabetes mellitus with foot ulcer: Secondary | ICD-10-CM | POA: Diagnosis present

## 2022-12-06 DIAGNOSIS — M2011 Hallux valgus (acquired), right foot: Secondary | ICD-10-CM | POA: Diagnosis not present

## 2022-12-06 DIAGNOSIS — M25474 Effusion, right foot: Secondary | ICD-10-CM | POA: Diagnosis not present

## 2022-12-06 DIAGNOSIS — I132 Hypertensive heart and chronic kidney disease with heart failure and with stage 5 chronic kidney disease, or end stage renal disease: Secondary | ICD-10-CM | POA: Diagnosis present

## 2022-12-06 DIAGNOSIS — I9589 Other hypotension: Secondary | ICD-10-CM | POA: Diagnosis present

## 2022-12-06 DIAGNOSIS — I2781 Cor pulmonale (chronic): Secondary | ICD-10-CM | POA: Diagnosis present

## 2022-12-06 DIAGNOSIS — I959 Hypotension, unspecified: Secondary | ICD-10-CM | POA: Diagnosis not present

## 2022-12-06 DIAGNOSIS — N186 End stage renal disease: Secondary | ICD-10-CM

## 2022-12-06 DIAGNOSIS — Z7901 Long term (current) use of anticoagulants: Secondary | ICD-10-CM

## 2022-12-06 DIAGNOSIS — I700 Atherosclerosis of aorta: Secondary | ICD-10-CM | POA: Diagnosis not present

## 2022-12-06 DIAGNOSIS — I509 Heart failure, unspecified: Secondary | ICD-10-CM | POA: Diagnosis not present

## 2022-12-06 DIAGNOSIS — L97512 Non-pressure chronic ulcer of other part of right foot with fat layer exposed: Secondary | ICD-10-CM | POA: Diagnosis not present

## 2022-12-06 DIAGNOSIS — I130 Hypertensive heart and chronic kidney disease with heart failure and stage 1 through stage 4 chronic kidney disease, or unspecified chronic kidney disease: Secondary | ICD-10-CM | POA: Diagnosis not present

## 2022-12-06 DIAGNOSIS — N250 Renal osteodystrophy: Secondary | ICD-10-CM | POA: Diagnosis not present

## 2022-12-06 DIAGNOSIS — Z79899 Other long term (current) drug therapy: Secondary | ICD-10-CM

## 2022-12-06 DIAGNOSIS — L84 Corns and callosities: Secondary | ICD-10-CM | POA: Diagnosis present

## 2022-12-06 DIAGNOSIS — M79671 Pain in right foot: Secondary | ICD-10-CM | POA: Diagnosis not present

## 2022-12-06 DIAGNOSIS — Z823 Family history of stroke: Secondary | ICD-10-CM

## 2022-12-06 DIAGNOSIS — E11649 Type 2 diabetes mellitus with hypoglycemia without coma: Secondary | ICD-10-CM | POA: Diagnosis not present

## 2022-12-06 DIAGNOSIS — D631 Anemia in chronic kidney disease: Secondary | ICD-10-CM | POA: Diagnosis not present

## 2022-12-06 DIAGNOSIS — E875 Hyperkalemia: Secondary | ICD-10-CM | POA: Diagnosis present

## 2022-12-06 DIAGNOSIS — G4733 Obstructive sleep apnea (adult) (pediatric): Secondary | ICD-10-CM | POA: Diagnosis present

## 2022-12-06 DIAGNOSIS — E11628 Type 2 diabetes mellitus with other skin complications: Secondary | ICD-10-CM | POA: Diagnosis present

## 2022-12-06 DIAGNOSIS — I272 Pulmonary hypertension, unspecified: Secondary | ICD-10-CM | POA: Diagnosis not present

## 2022-12-06 DIAGNOSIS — R0989 Other specified symptoms and signs involving the circulatory and respiratory systems: Secondary | ICD-10-CM | POA: Diagnosis not present

## 2022-12-06 DIAGNOSIS — E785 Hyperlipidemia, unspecified: Secondary | ICD-10-CM | POA: Diagnosis not present

## 2022-12-06 LAB — CBC WITH DIFFERENTIAL/PLATELET
Abs Immature Granulocytes: 0.2 10*3/uL — ABNORMAL HIGH (ref 0.00–0.07)
Basophils Absolute: 0.1 10*3/uL (ref 0.0–0.1)
Basophils Relative: 1 %
Eosinophils Absolute: 0 10*3/uL (ref 0.0–0.5)
Eosinophils Relative: 0 %
HCT: 39.8 % (ref 39.0–52.0)
Hemoglobin: 12.6 g/dL — ABNORMAL LOW (ref 13.0–17.0)
Immature Granulocytes: 1 %
Lymphocytes Relative: 6 %
Lymphs Abs: 0.8 10*3/uL (ref 0.7–4.0)
MCH: 29.4 pg (ref 26.0–34.0)
MCHC: 31.7 g/dL (ref 30.0–36.0)
MCV: 92.8 fL (ref 80.0–100.0)
Monocytes Absolute: 1.9 10*3/uL — ABNORMAL HIGH (ref 0.1–1.0)
Monocytes Relative: 13 %
Neutro Abs: 11.3 10*3/uL — ABNORMAL HIGH (ref 1.7–7.7)
Neutrophils Relative %: 79 %
Platelets: 189 10*3/uL (ref 150–400)
RBC: 4.29 MIL/uL (ref 4.22–5.81)
RDW: 18.7 % — ABNORMAL HIGH (ref 11.5–15.5)
WBC: 14.4 10*3/uL — ABNORMAL HIGH (ref 4.0–10.5)
nRBC: 0 % (ref 0.0–0.2)

## 2022-12-06 LAB — COMPREHENSIVE METABOLIC PANEL
ALT: 31 U/L (ref 0–44)
AST: 26 U/L (ref 15–41)
Albumin: 3.8 g/dL (ref 3.5–5.0)
Alkaline Phosphatase: 58 U/L (ref 38–126)
Anion gap: 19 — ABNORMAL HIGH (ref 5–15)
BUN: 34 mg/dL — ABNORMAL HIGH (ref 6–20)
CO2: 23 mmol/L (ref 22–32)
Calcium: 9 mg/dL (ref 8.9–10.3)
Chloride: 91 mmol/L — ABNORMAL LOW (ref 98–111)
Creatinine, Ser: 12.51 mg/dL — ABNORMAL HIGH (ref 0.61–1.24)
GFR, Estimated: 4 mL/min — ABNORMAL LOW (ref >60)
Glucose, Bld: 126 mg/dL — ABNORMAL HIGH (ref 70–99)
Potassium: 5 mmol/L (ref 3.5–5.1)
Sodium: 133 mmol/L — ABNORMAL LOW (ref 135–145)
Total Bilirubin: 1.1 mg/dL (ref 0.3–1.2)
Total Protein: 8.5 g/dL — ABNORMAL HIGH (ref 6.5–8.1)

## 2022-12-06 LAB — MRSA NEXT GEN BY PCR, NASAL: MRSA by PCR Next Gen: DETECTED — AB

## 2022-12-06 LAB — LACTIC ACID, PLASMA
Lactic Acid, Venous: 3.2 mmol/L (ref 0.5–1.9)
Lactic Acid, Venous: 2.7 mmol/L (ref 0.5–1.9)

## 2022-12-06 LAB — GLUCOSE, CAPILLARY
Glucose-Capillary: 101 mg/dL — ABNORMAL HIGH (ref 70–99)
Glucose-Capillary: 151 mg/dL — ABNORMAL HIGH (ref 70–99)
Glucose-Capillary: 97 mg/dL (ref 70–99)

## 2022-12-06 LAB — HIV ANTIBODY (ROUTINE TESTING W REFLEX): HIV Screen 4th Generation wRfx: NONREACTIVE

## 2022-12-06 MED ORDER — SODIUM CHLORIDE 0.9 % IV BOLUS
500.0000 mL | Freq: Once | INTRAVENOUS | Status: AC
  Administered 2022-12-06: 500 mL via INTRAVENOUS

## 2022-12-06 MED ORDER — MIDODRINE HCL 5 MG PO TABS
10.0000 mg | ORAL_TABLET | Freq: Three times a day (TID) | ORAL | Status: DC
  Administered 2022-12-06 (×2): 10 mg via ORAL
  Filled 2022-12-06 (×2): qty 2

## 2022-12-06 MED ORDER — MIDODRINE HCL 5 MG PO TABS
10.0000 mg | ORAL_TABLET | Freq: Three times a day (TID) | ORAL | Status: DC
  Administered 2022-12-06 – 2022-12-07 (×2): 10 mg via ORAL
  Filled 2022-12-06 (×2): qty 2

## 2022-12-06 MED ORDER — SODIUM CHLORIDE 0.9 % IV SOLN: 250.0000 mL | INTRAVENOUS | Status: DC

## 2022-12-06 MED ORDER — MORPHINE SULFATE (PF) 4 MG/ML IV SOLN: 4.0000 mg | Freq: Once | INTRAVENOUS | Status: DC

## 2022-12-06 MED ORDER — ONDANSETRON HCL 4 MG/2ML IJ SOLN: 4.0000 mg | Freq: Four times a day (QID) | INTRAMUSCULAR | Status: DC | PRN

## 2022-12-06 MED ORDER — HEPARIN SODIUM (PORCINE) 1000 UNIT/ML IJ SOLN
4.6000 mL | Freq: Once | INTRAMUSCULAR | Status: AC
  Administered 2022-12-06: 4.6 mL
  Filled 2022-12-06: qty 4.6
  Filled 2022-12-06: qty 5

## 2022-12-06 MED ORDER — ALBUMIN HUMAN 25 % IV SOLN
50.0000 g | Freq: Four times a day (QID) | INTRAVENOUS | Status: DC
  Administered 2022-12-06 – 2022-12-07 (×3): 50 g via INTRAVENOUS
  Filled 2022-12-06 (×4): qty 200

## 2022-12-06 MED ORDER — SODIUM CHLORIDE 0.9 % IV BOLUS: 500.0000 mL | Freq: Once | INTRAVENOUS | Status: DC

## 2022-12-06 MED ORDER — CHLORHEXIDINE GLUCONATE CLOTH 2 % EX PADS
6.0000 | MEDICATED_PAD | Freq: Every day | CUTANEOUS | Status: DC
  Administered 2022-12-06 – 2022-12-15 (×9): 6 via TOPICAL

## 2022-12-06 MED ORDER — POLYETHYLENE GLYCOL 3350 17 G PO PACK: 17.0000 g | PACK | Freq: Every day | ORAL | Status: DC | PRN

## 2022-12-06 MED ORDER — APIXABAN 5 MG PO TABS
5.0000 mg | ORAL_TABLET | Freq: Two times a day (BID) | ORAL | Status: DC
  Administered 2022-12-06 – 2022-12-10 (×9): 5 mg via ORAL
  Filled 2022-12-06 (×9): qty 1

## 2022-12-06 MED ORDER — VANCOMYCIN HCL 1500 MG/300ML IV SOLN
1500.0000 mg | INTRAVENOUS | Status: DC
  Administered 2022-12-09 – 2022-12-11 (×2): 1500 mg via INTRAVENOUS
  Filled 2022-12-06 (×3): qty 300

## 2022-12-06 MED ORDER — HEPARIN SODIUM (PORCINE) 5000 UNIT/ML IJ SOLN: 5000.0000 [IU] | Freq: Three times a day (TID) | INTRAMUSCULAR | Status: DC

## 2022-12-06 MED ORDER — SODIUM CHLORIDE 0.9 % IV SOLN
1.0000 g | INTRAVENOUS | Status: DC
  Administered 2022-12-06 – 2022-12-08 (×3): 1 g via INTRAVENOUS
  Filled 2022-12-06 (×4): qty 10

## 2022-12-06 MED ORDER — NOREPINEPHRINE 4 MG/250ML-% IV SOLN
2.0000 ug/min | INTRAVENOUS | Status: DC
  Administered 2022-12-06: 2 ug/min via INTRAVENOUS
  Administered 2022-12-07: 7 ug/min via INTRAVENOUS
  Filled 2022-12-06 (×2): qty 250

## 2022-12-06 MED ORDER — ATORVASTATIN CALCIUM 40 MG PO TABS
40.0000 mg | ORAL_TABLET | Freq: Every day | ORAL | Status: DC
  Administered 2022-12-06 – 2022-12-15 (×10): 40 mg via ORAL
  Filled 2022-12-06 (×10): qty 1

## 2022-12-06 MED ORDER — INSULIN ASPART 100 UNIT/ML IJ SOLN
0.0000 [IU] | INTRAMUSCULAR | Status: DC
  Administered 2022-12-06: 1 [IU] via SUBCUTANEOUS

## 2022-12-06 MED ORDER — ACETAMINOPHEN 325 MG PO TABS
650.0000 mg | ORAL_TABLET | Freq: Once | ORAL | Status: AC
  Administered 2022-12-06: 650 mg via ORAL
  Filled 2022-12-06: qty 2

## 2022-12-06 MED ORDER — ACETAMINOPHEN 325 MG PO TABS
650.0000 mg | ORAL_TABLET | ORAL | Status: DC | PRN
  Administered 2022-12-06 – 2022-12-16 (×11): 650 mg via ORAL
  Filled 2022-12-06 (×11): qty 2

## 2022-12-06 MED ORDER — HYDROMORPHONE HCL 1 MG/ML IJ SOLN
0.5000 mg | INTRAMUSCULAR | Status: DC | PRN
  Administered 2022-12-07: 0.5 mg via INTRAVENOUS
  Filled 2022-12-06: qty 1

## 2022-12-06 MED ORDER — ONDANSETRON HCL 4 MG PO TABS: 4.0000 mg | ORAL_TABLET | Freq: Four times a day (QID) | ORAL | Status: DC | PRN

## 2022-12-06 MED ORDER — VANCOMYCIN HCL 1500 MG/300ML IV SOLN
1500.0000 mg | Freq: Once | INTRAVENOUS | Status: AC
  Administered 2022-12-06: 1500 mg via INTRAVENOUS
  Filled 2022-12-06: qty 300

## 2022-12-06 MED ORDER — MIDODRINE HCL 5 MG PO TABS: 2.5000 mg | ORAL_TABLET | Freq: Three times a day (TID) | ORAL | Status: DC

## 2022-12-06 MED ORDER — VANCOMYCIN HCL IN DEXTROSE 1-5 GM/200ML-% IV SOLN
1000.0000 mg | Freq: Once | INTRAVENOUS | Status: AC
  Administered 2022-12-06: 1000 mg via INTRAVENOUS
  Filled 2022-12-06: qty 200

## 2022-12-06 MED ORDER — DOCUSATE SODIUM 100 MG PO CAPS: 100.0000 mg | ORAL_CAPSULE | Freq: Two times a day (BID) | ORAL | Status: DC | PRN

## 2022-12-06 MED ORDER — LORAZEPAM 1 MG PO TABS: 0.5000 mg | ORAL_TABLET | Freq: Four times a day (QID) | ORAL | Status: DC | PRN

## 2022-12-06 MED ORDER — MUPIROCIN 2 % EX OINT
1.0000 | TOPICAL_OINTMENT | Freq: Two times a day (BID) | CUTANEOUS | Status: AC
  Administered 2022-12-07 – 2022-12-11 (×5): 1 via NASAL
  Filled 2022-12-06 (×2): qty 22

## 2022-12-06 NOTE — Progress Notes (Signed)
eLink Physician-Brief Progress Note Patient Name: [name] DOB: Oct 29, 1975 MRN: [id number]   Date of Service  12/06/2022  HPI/Events of Note  47 year old male with end-stage renal failure on hemodialysis and chronic hypotension.  Blood pressure is low even though patient is asymptomatic.  Bedside RN wants to know if patient needs on a line and whether or not to start norepinephrine.  eICU Interventions  Patient seen and examined on camera.  He is awake alert and oriented.  He is able to sit up in bed without feeling lightheaded.  Blood pressure at the time I am seeing him this 82/60 but he is on 2 mcg/min of norepinephrine.  He is chronically hypotensive but has been trying to stay off midodrine to get on the transplant list.  Midodrine restarted this evening.  I will monitor him off pressors and not treat unless he is symptomatic.  Of note, he was able to tolerate intermittent hemodialysis this morning.     Intervention Category Intermediate Interventions: Hypotension - evaluation and management  Carilyn Goodpasture 12/06/2022, 9:14 PM

## 2022-12-06 NOTE — ED Notes (Signed)
PT back from radiology

## 2022-12-06 NOTE — ED Notes (Signed)
Bp cuff switched to forearm. BP 77/63 (67)

## 2022-12-06 NOTE — ED Notes (Signed)
CCM at bedside 

## 2022-12-06 NOTE — H&P (Signed)
History and Physical    [name] ZOX:[id number] DOB: [birth year] DOA: 12/06/2022  PCP: Robert Mast, MD (Confirm with patient/family/NH records and if not entered, this has to be entered at [facility] point of entry) Patient coming from: Home  I have personally briefly reviewed patient's old medical records in [facility]  Chief Complaint: Right foot pain  HPI: [name] is a 47 y.o. male with medical history significant of ESRD on HD, HTN, PAF on Eliquis, recurrent HD catheter associated thrombosis on Eliquis, chronic HFpEF/right-sided CHF with moderate pulm hypertension and cor pulmonale, HD associated orthostatic hypotension, presented with worsening of right foot pain, ulcer drainage and fever  Patient started noticed a small ulcer on the right foot bottom about  2 months ago with intermittent thin purulent discharge which he does wound care himself at home use nail cutter, and OTC meds but was never evaluated by medical professionals and was never taking antibiotics.  1 week ago, he noticed a callus develop covering the ulcer and he used nail cutter to remove the callus with copious amounts of pus come out.  2 days ago, patient started to feel severe pain of right foot extending to mid shin, associated with subjective fever and chills.  The pain became unbearable today ED Course: Fever 102.7, tachycardia parameters showed elevated lactic acid 3.2, creatinine 12.5, K5.0, WBC 14.4  Patient was given 500 mL of IV bolus and started IV antibiotics cefepime and vancomycin.  X-ray showed soft tissue swelling but no bony erosions.  Review of Systems: As per HPI otherwise 14 point review of systems negative.    Past Medical History:  Diagnosis Date   Acute respiratory failure with hypoxia (HCC) 11/30/2018   Anemia    ESRD   ESRD on hemodialysis (HCC) 06/07/2011   TTS [place]. Started HD in 2006, got transplant in 2014 lasted until Dec 2019 then went back on HD.  Has L thigh  AVG as of Jun 2020.  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    History of blood transfusion    Hypertension    03/19/22- has not had high blood pressure in 5 years.   Morbid obesity (HCC)    Paroxysmal atrial fibrillation (HCC)    Pre-diabetes    no meds   Sepsis (HCC) 11/30/2018   Sleep apnea    lost weight, does not use CPAP    Past Surgical History:  Procedure Laterality Date   ARTERIOVENOUS GRAFT PLACEMENT  08/09/2010   Left Thigh Graft by Dr. Cari Caraway   AV FISTULA PLACEMENT Right 05/18/2018   Procedure: INSERTION OF ARTERIOVENOUS (AV) GORE-TEX GRAFT Left THIGH;  Surgeon: Maeola Harman, MD;  Location: [facility] OR;  Service: Vascular;  Laterality: Right;   AV FISTULA PLACEMENT Right 02/15/2021   Procedure: INSERTION OF ARTERIOVENOUS (AV) GORE-TEX LOOP GRAFT RIGHT THIGH;  Surgeon: Maeola Harman, MD;  Location: [facility] OR;  Service: Vascular;  Laterality: Right;   BIOPSY  05/20/2022   Procedure: BIOPSY;  Surgeon: Shellia Cleverly, DO;  Location: [facility];  Service: Gastroenterology;;   CAPD INSERTION N/A 06/13/2022   Procedure: LAPAROSCOPIC INSERTION CONTINUOUS AMBULATORY PERITONEAL DIALYSIS  (CAPD) CATHETER;  Surgeon: Maeola Harman, MD;  Location: [facility] OR;  Service: Vascular;  Laterality: N/A;   CAPD REMOVAL  05/08/2011   Procedure: CONTINUOUS AMBULATORY PERITONEAL DIALYSIS  (CAPD) CATHETER REMOVAL;  Surgeon: Iona Coach, MD;  Location: [facility] OR;  Service: General;  Laterality: N/A;  Removal  of CAPD catheter, Dr. requests to go after 100   CAPD REMOVAL N/A 08/08/2022   Procedure: REMOVAL CONTINUOUS AMBULATORY PERITONEAL DIALYSIS  (CAPD) CATHETER;  Surgeon: Maeola Harman, MD;  Location: [facility] OR;  Service: Vascular;  Laterality: N/A;   COLONOSCOPY WITH PROPOFOL N/A 05/20/2022   Procedure: COLONOSCOPY WITH PROPOFOL;  Surgeon: Shellia Cleverly, DO;  Location: [facility];  Service: Gastroenterology;  Laterality: N/A;   INSERTION OF  DIALYSIS CATHETER  10/05/2010   Right Femoral Cath insertion by Dr. Leonides Sake.  Pt ahas had several caths inserted.   INSERTION OF DIALYSIS CATHETER Right 02/15/2021   Procedure: Attempted INSERTION OF RIGHT and Left Internal Jugular DIALYSIS CATHETER, Insertion of Left Femoral Vein Dialysis Catheter;  Surgeon: Maeola Harman, MD;  Location: [facility] OR;  Service: Vascular;  Laterality: Right;   INSERTION OF DIALYSIS CATHETER Right 04/26/2021   Procedure: INSERTION OF TUNNELED 55cm PALIDROME PRECISION CHRONIC DIALYSIS CATHETER;  Surgeon: Leonie Douglas, MD;  Location: [facility] OR;  Service: Vascular;  Laterality: Right;   INSERTION OF DIALYSIS CATHETER Left 12/26/2021   Procedure: INSERTION OF TUNNELED  DIALYSIS CATHETER LEFT FEMORAL ARTERY;  Surgeon: Maeola Harman, MD;  Location: [facility] OR;  Service: Vascular;  Laterality: Left;   INSERTION OF DIALYSIS CATHETER Left 03/06/2022   Procedure: INSERTION OF DIALYSIS CATHETER;  Surgeon: Maeola Harman, MD;  Location: [facility] OR;  Service: Vascular;  Laterality: Left;   IR FLUORO GUIDE CV LINE LEFT  04/23/2022   IR FLUORO GUIDE CV LINE RIGHT  05/11/2018   IR FLUORO GUIDE CV LINE RIGHT  07/03/2021   IR FLUORO GUIDE CV LINE RIGHT  07/16/2021   IR PTA ADDL CENTRAL DIALYSIS SEG THRU DIALY CIRCUIT RIGHT Right 07/03/2021   IR US GUIDE VASC ACCESS LEFT  04/23/2022   IR US GUIDE VASC ACCESS RIGHT  05/11/2018   IR VENO/EXT/UNI LEFT  04/23/2022   IR VENOCAVAGRAM IVC  07/16/2021   KIDNEY TRANSPLANT  2014   KNEE ARTHROSCOPY Left    LAPAROSCOPIC LYSIS OF ADHESIONS N/A 06/13/2022   Procedure: LAPAROSCOPIC LYSIS OF ADHESIONS;  Surgeon: Maeola Harman, MD;  Location: [facility] OR;  Service: Vascular;  Laterality: N/A;   POLYPECTOMY  05/20/2022   Procedure: POLYPECTOMY;  Surgeon: Shellia Cleverly, DO;  Location: [facility];  Service: Gastroenterology;;   THROMBECTOMY W/ EMBOLECTOMY Right 04/26/2021   Procedure: REMOVAL OF RIGHT THIGH ARTERIOVENOUS  GORE-TEX GRAFT AND REPAIR OF RIGHT COMMON FEMORAL ARTERY;  Surgeon: Leonie Douglas, MD;  Location: [facility] OR;  Service: Vascular;  Laterality: Right;   UPPER EXTREMITY ANGIOGRAPHY Bilateral 05/13/2018   Procedure: UPPER EXTREMITY ANGIOGRAPHY - bilarteral;  Surgeon: Cephus Shelling, MD;  Location: [facility];  Service: Cardiovascular;  Laterality: Bilateral;     reports that he quit smoking about 7 years ago. His smoking use included cigarettes. He has never used smokeless tobacco. He reports that he does not currently use alcohol. He reports that he does not currently use drugs after having used the following drugs: Marijuana.  No Known Allergies  Family History  Problem Relation Age of Onset   Diabetes Father    Stroke Father    Stroke Maternal Grandmother    Anesthesia problems Neg Hx      Prior to Admission medications   Medication Sig Start Date End Date Taking? Authorizing Provider  apixaban (ELIQUIS) 5 MG TABS tablet Take 5 mg by mouth 2 (two) times daily. 10/10/21   [provider]  atorvastatin (LIPITOR)  40 MG tablet Take 1 tablet (40 mg total) by mouth daily. 12/20/21   Robert Mast, MD  B Complex-C-Zn-Folic Acid (DIALYVITE/ZINC) TABS Take 1 tablet by mouth daily. Patient not taking: Reported on 08/05/2022 01/16/22   [provider]  Calcium Carb-Cholecalciferol (CALCIUM 600 + D PO) Take 2 tablets by mouth daily. Patient not taking: Reported on 08/05/2022    [provider]  ferric citrate (AURYXIA) 1 GM 210 MG(Fe) tablet Take 210-630 mg by mouth See admin instructions. Take 630 mg with each meal and 210 mg with each snack 09/06/19   [provider]  oxyCODONE-acetaminophen (PERCOCET) 5-325 MG tablet Take 1 tablet by mouth every 6 (six) hours as needed for severe pain. 08/08/22   Dara Lords, PA-C    Physical Exam: Vitals:   12/06/22 1044 12/06/22 1215 12/06/22 1246  BP: (!) 142/112    Pulse: (!) 128    Resp: 20    Temp: 98.4  F (36.9 C) (!) 102.7 F (39.3 C)   TempSrc: Oral Rectal   SpO2: 99%    Weight:   (!) 139.7 kg    Constitutional: NAD, calm, comfortable Vitals:   12/06/22 1044 12/06/22 1215 12/06/22 1246  BP: (!) 142/112    Pulse: (!) 128    Resp: 20    Temp: 98.4 F (36.9 C) (!) 102.7 F (39.3 C)   TempSrc: Oral Rectal   SpO2: 99%    Weight:   (!) 139.7 kg   Eyes: PERRL, lids and conjunctivae normal ENMT: Mucous membranes are moist. Posterior pharynx clear of any exudate or lesions.Normal dentition.  Neck: normal, supple, no masses, no thyromegaly Respiratory: clear to auscultation bilaterally, no wheezing, no crackles. Normal respiratory effort. No accessory muscle use.  Cardiovascular: Regular rate and rhythm, no murmurs / rubs / gallops. No extremity edema. 2+ pedal pulses. No carotid bruits.  Abdomen: no tenderness, no masses palpated. No hepatosplenomegaly. Bowel sounds positive.  Musculoskeletal: no clubbing / cyanosis. No joint deformity upper and lower extremities. Good ROM, no contractures. Normal muscle tone.  Skin: Small ulcer about 1 cm in diameter on the bottom of the foot with hay colored discharge, severe tenderness and warmth to touch compared to left side Neurologic: CN 2-12 grossly intact. Sensation intact, DTR normal. Strength 5/5 in all 4.  Psychiatric: Normal judgment and insight. Alert and oriented x 3. Normal mood.     Labs on Admission: I have personally reviewed following labs and imaging studies  CBC: Recent Labs  Lab 12/06/22 1053  WBC 14.4*  NEUTROABS 11.3*  HGB 12.6*  HCT 39.8  MCV 92.8  PLT 189   Basic Metabolic Panel: Recent Labs  Lab 12/06/22 1053  NA 133*  K 5.0  CL 91*  CO2 23  GLUCOSE 126*  BUN 34*  CREATININE 12.51*  CALCIUM 9.0   GFR: Estimated Creatinine Clearance: 10.7 mL/min (A) (by C-G formula based on SCr of 12.51 mg/dL (H)). Liver Function Tests: Recent Labs  Lab 12/06/22 1053  AST 26  ALT 31  ALKPHOS 58  BILITOT 1.1   PROT 8.5*  ALBUMIN 3.8   No results for input(s): "LIPASE", "AMYLASE" in the last 168 hours. No results for input(s): "AMMONIA" in the last 168 hours. Coagulation Profile: No results for input(s): "INR", "PROTIME" in the last 168 hours. Cardiac Enzymes: No results for input(s): "CKTOTAL", "CKMB", "CKMBINDEX", "TROPONINI" in the last 168 hours. BNP (last 3 results) No results for input(s): "PROBNP" in the last 8760 hours. HbA1C: No  results for input(s): "HGBA1C" in the last 72 hours. CBG: No results for input(s): "GLUCAP" in the last 168 hours. Lipid Profile: No results for input(s): "CHOL", "HDL", "LDLCALC", "TRIG", "CHOLHDL", "LDLDIRECT" in the last 72 hours. Thyroid Function Tests: No results for input(s): "TSH", "T4TOTAL", "FREET4", "T3FREE", "THYROIDAB" in the last 72 hours. Anemia Panel: No results for input(s): "VITAMINB12", "FOLATE", "FERRITIN", "TIBC", "IRON", "RETICCTPCT" in the last 72 hours. Urine analysis:    Component Value Date/Time   COLORURINE STRAW (A) 05/10/2018 1811   APPEARANCEUR CLEAR 05/10/2018 1811   LABSPEC 1.012 05/10/2018 1811   PHURINE 7.0 05/10/2018 1811   GLUCOSEU 150 (A) 05/10/2018 1811   HGBUR SMALL (A) 05/10/2018 1811   BILIRUBINUR NEGATIVE 11/03/2018 1515   KETONESUR NEGATIVE 05/10/2018 1811   PROTEINUR Positive (A) 11/03/2018 1515   PROTEINUR >=300 (A) 05/10/2018 1811   UROBILINOGEN 0.2 11/03/2018 1515   NITRITE NEGATIVE 11/03/2018 1515   NITRITE NEGATIVE 05/10/2018 1811   LEUKOCYTESUR Small (1+) (A) 11/03/2018 1515    Radiological Exams on Admission: DG Shoulder Right  Result Date: 12/06/2022 CLINICAL DATA:  pain and possible infection EXAM: RIGHT SHOULDER - 2+ VIEW COMPARISON:  None Available. FINDINGS: Borderline widening of the [facility] joint. No fracture or dislocation. Normal mineralization. No significant osseous degenerative change. IMPRESSION: Borderline widening of the [facility] joint. No fracture or other acute findings. Electronically  Signed   By: Corlis Leak M.D.   On: 12/06/2022 11:42   DG Foot Complete Right  Result Date: 12/06/2022 CLINICAL DATA:  pain and possible infection EXAM: RIGHT FOOT COMPLETE - 3+ VIEW COMPARISON:  None Available. FINDINGS: No fracture or dislocation. Normal mineralization. Mild hallux valgus deformity. Small calcaneal spurs. No subcutaneous gas or radiodense foreign body. IMPRESSION: 1. No acute findings. 2. Mild hallux valgus. Electronically Signed   By: Corlis Leak M.D.   On: 12/06/2022 11:37   DG Chest 2 View  Result Date: 12/06/2022 CLINICAL DATA:  16109 Infection 60454 EXAM: CHEST - 2 VIEW COMPARISON:  02/15/2021 FINDINGS: Low lung volumes with some crowding of bronchovascular structures in the lung bases. No focal infiltrate or overt edema. Heart size and mediastinal contours are within normal limits. Aortic Atherosclerosis (ICD10-170.0). No effusion. Visualized bones unremarkable. IMPRESSION: Low lung volumes. No acute findings. Electronically Signed   By: Corlis Leak M.D.   On: 12/06/2022 11:35    EKG: Independently reviewed. Sinus tachy, no acute ST changes.  Assessment/Plan Active Problems:   * No active hospital problems. *  (please populate well all problems here in Problem List. (For example, if patient is on BP meds at home and you resume or decide to hold them, it is a problem that needs to be her. Same for CAD, COPD, HLD and so on)   Right foot infected ulcer Right foot infection Right foot and right shin cellulitis -Rule out osteomyelitis -MRI ordered, depends on the result will consider podiatry consultation. -Continue vancomycin and cefepime -Recheck ABI.  May need vascular surgeon intervention.  Noticed that patient had a abnormal ABI study of right lower extremity in 2022.  Sepsis -Evidenced by new onset of fever tachycardia leukocytosis, source of infection is right foot infection -Will give a 500+500 mL IV bolus.  Closely monitor volume status clinically and repeat  x-ray tomorrow morning, as patient is on HD -Start midodrine 2.5 mg 3 times daily  Question of PVD -ABI study back in 2022.  Patient does have frequent cramping like calf pain when on HD, but denies any history of claudication -  ABI ordered  Hx of PAF Hx of recurrent HD catheter thrombosis -In sinus rhythm -Hold off Eliquis for possible incoming surgical intervention -Start DVT prophylaxis with heparin subcu  ESRD on HD TTS -Completed HD today and next HD will be Tuesday.   DVT prophylaxis: Heparin subcu Code Status: Full code Family Communication: None at bedside Disposition Plan: Patient is sick with right foot infection and sepsis requiring IV antibiotics and inpatient podiatry consultation, expect more than 2 midnight hospital stay Consults called: None Admission status: PCU   Emeline General MD [facility] Pager [phone]  12/06/2022, 12:47 PM

## 2022-12-06 NOTE — ED Notes (Signed)
Hospitalist at bedside 

## 2022-12-06 NOTE — ED Triage Notes (Addendum)
Pt c/o R foot pain and swelling r/t wound on ball of foot.  Pain score 10/10.  Hx of dialysis.  Pt reports previous wounds on L foot which [facility]    Pt would like R should assessed.  Sts he has flared up and old football injury.

## 2022-12-06 NOTE — Progress Notes (Signed)
Pharmacy Antibiotic Note  [name] is a 47 y.o. male admitted on 12/06/2022 with  wound infection . Pt has ESRD and is on HD TTS. Last HD on 12/06/22 AM. Pharmacy has been consulted for Cefepime and Vancomycin dosing.  WBC 14.4, Tmax 102.7 LA 3.2 Patient received IV bolus of LR x2. No further fluids due to ESRD  Plan: Start Cefepime 1g IV q24h Initiate loading dose of Vancomycin 2500mg  IV x 1, followed by  Vancomycin 1500mg  IV every TTS with HD     > Goal vancomycin trough level: 10-15 mcg/mL    > Check vancomycin levels at steady state  F/u for any changes in HD plan per nephrology recs Monitor daily CBC, temp, SCr, and for clinical signs of improvement  F/u cultures and de-escalate antibiotics as able     Temp (24hrs), Avg:98.4 F (36.9 C), Min:98.4 F (36.9 C), Max:98.4 F (36.9 C)  Recent Labs  Lab 12/06/22 1052 12/06/22 1053  WBC  --  14.4*  CREATININE  --  12.51*  LATICACIDVEN 3.2*  --     CrCl cannot be calculated (Unknown ideal weight.).    No Known Allergies  Antimicrobials this admission: Cefepime 6/29 >>  Vancomycin 6/29 >>   Dose adjustments this admission: N/A  Microbiology results: 6/29 BCx: sent   Thank you for allowing pharmacy to be a part of this patient's care.  Wilburn Cornelia, PharmD, BCPS Clinical Pharmacist 12/06/2022 12:00 PM   Please refer to [facility] for pharmacy phone number

## 2022-12-06 NOTE — ED Provider Notes (Signed)
[facility] EMERGENCY DEPARTMENT AT [facility] Provider Note   CSN: [id number] Arrival date & time: 12/06/22  1041     History  Chief Complaint  Patient presents with   Wound Check   Foot Pain    [name] is a 47 y.o. male.  The history is provided by the patient and medical records. No language interpreter was used.  Wound Check  Foot Pain     [name] is a 47 year old male who presents to the ED with chief complaint of foot pain x 2 days. The patient has had an ulcer on the ball of his right foot for 2-3 months, but states the pain has acutely worsened over the last few days. The pain radiates from the bottom of his foot up his leg and he states the top of his foot is tender to the touch. Walking is incredibly painful and he typically has to limp to get around. The patient has been using a steroid cream he had from a previous skin issue, but states this has not improved the ulcer. He has not previously sought treatment for this ulcer and states he has never had anything like this before.   Home Medications Prior to Admission medications   Medication Sig Start Date End Date Taking? Authorizing Provider  apixaban (ELIQUIS) 5 MG TABS tablet Take 5 mg by mouth 2 (two) times daily. 10/10/21   [provider]  atorvastatin (LIPITOR) 40 MG tablet Take 1 tablet (40 mg total) by mouth daily. 12/20/21   [name], MD  B Complex-C-Zn-Folic Acid (DIALYVITE/ZINC) TABS Take 1 tablet by mouth daily. Patient not taking: Reported on 08/05/2022 01/16/22   [provider]  Calcium Carb-Cholecalciferol (CALCIUM 600 + D PO) Take 2 tablets by mouth daily. Patient not taking: Reported on 08/05/2022    [provider]  ferric citrate (AURYXIA) 1 GM 210 MG(Fe) tablet Take 210-630 mg by mouth See admin instructions. Take 630 mg with each meal and 210 mg with each snack 09/06/19   [provider]  oxyCODONE-acetaminophen (PERCOCET) 5-325 MG tablet  Take 1 tablet by mouth every 6 (six) hours as needed for severe pain. 08/08/22   Dara Lords, PA-C      Allergies    Patient has no known allergies.    Review of Systems   Review of Systems  All other systems reviewed and are negative.   Physical Exam Updated Vital Signs BP (!) 142/112 (BP Location: Right Arm)   Pulse (!) 128   Temp 98.4 F (36.9 C) (Oral)   Resp 20   SpO2 99%  Physical Exam Vitals and nursing note reviewed.  Constitutional:      General: He is not in acute distress.    Appearance: He is well-developed. He is obese.  HENT:     Head: Atraumatic.  Eyes:     Conjunctiva/sclera: Conjunctivae normal.  Cardiovascular:     Rate and Rhythm: Tachycardia present.     Pulses: Normal pulses.     Heart sounds: Normal heart sounds.  Pulmonary:     Effort: Pulmonary effort is normal.     Breath sounds: Normal breath sounds.  Abdominal:     Palpations: Abdomen is soft.  Musculoskeletal:        General: Tenderness (Right lower extremity: Moderate ED edema and warmth noted to the dorsum and the sole of the foot extending towards the mid shin.  There is a callus with a ulceration  noted to the ball of the foot oozing out some fluid.  It is tender to palpation.) present.     Cervical back: Neck supple.  Skin:    Findings: No rash.  Neurological:     Mental Status: He is alert.     ED Results / Procedures / Treatments   Labs (all labs ordered are listed, but only abnormal results are displayed) Labs Reviewed  LACTIC ACID, PLASMA - Abnormal; Notable for the following components:      Result Value   Lactic Acid, Venous 3.2 (*)    All other components within normal limits  COMPREHENSIVE METABOLIC PANEL - Abnormal; Notable for the following components:   Sodium 133 (*)    Chloride 91 (*)    Glucose, Bld 126 (*)    BUN 34 (*)    Creatinine, Ser 12.51 (*)    Total Protein 8.5 (*)    GFR, Estimated 4 (*)    Anion gap 19 (*)    All other components within normal  limits  CBC WITH DIFFERENTIAL/PLATELET - Abnormal; Notable for the following components:   WBC 14.4 (*)    Hemoglobin 12.6 (*)    RDW 18.7 (*)    Neutro Abs 11.3 (*)    Monocytes Absolute 1.9 (*)    Abs Immature Granulocytes 0.20 (*)    All other components within normal limits  CULTURE, BLOOD (ROUTINE X 2)  CULTURE, BLOOD (ROUTINE X 2)  LACTIC ACID, PLASMA  URINALYSIS, ROUTINE W REFLEX MICROSCOPIC    EKG EKG Interpretation Date/Time:  Saturday December 06 2022 11:44:14 EDT Ventricular Rate:  127 PR Interval:  169 QRS Duration:  102 QT Interval:  295 QTC Calculation: 429 R Axis:   -60  Text Interpretation: Sinus tachycardia Left anterior fascicular block Low voltage, precordial leads Confirmed by Alvino Blood ([id number]) on 12/06/2022 12:02:26 PM  Radiology DG Shoulder Right  Result Date: 12/06/2022 CLINICAL DATA:  pain and possible infection EXAM: RIGHT SHOULDER - 2+ VIEW COMPARISON:  None Available. FINDINGS: Borderline widening of the [facility] joint. No fracture or dislocation. Normal mineralization. No significant osseous degenerative change. IMPRESSION: Borderline widening of the [facility] joint. No fracture or other acute findings. Electronically Signed   By: Corlis Leak M.D.   On: 12/06/2022 11:42   DG Foot Complete Right  Result Date: 12/06/2022 CLINICAL DATA:  pain and possible infection EXAM: RIGHT FOOT COMPLETE - 3+ VIEW COMPARISON:  None Available. FINDINGS: No fracture or dislocation. Normal mineralization. Mild hallux valgus deformity. Small calcaneal spurs. No subcutaneous gas or radiodense foreign body. IMPRESSION: 1. No acute findings. 2. Mild hallux valgus. Electronically Signed   By: Corlis Leak M.D.   On: 12/06/2022 11:37   DG Chest 2 View  Result Date: 12/06/2022 CLINICAL DATA:  [id number] Infection [id number] EXAM: CHEST - 2 VIEW COMPARISON:  02/15/2021 FINDINGS: Low lung volumes with some crowding of bronchovascular structures in the lung bases. No focal infiltrate or overt edema.  Heart size and mediastinal contours are within normal limits. Aortic Atherosclerosis (ICD10-170.0). No effusion. Visualized bones unremarkable. IMPRESSION: Low lung volumes. No acute findings. Electronically Signed   By: Corlis Leak M.D.   On: 12/06/2022 11:35    Procedures .Critical Care  Performed by: Fayrene Helper, PA-C Authorized by: Fayrene Helper, PA-C   Critical care provider statement:    Critical care time (minutes):  30   Critical care was time spent personally by me on the following activities:  Development of treatment plan with patient or surrogate,  discussions with consultants, evaluation of patient's response to treatment, examination of patient, ordering and review of laboratory studies, ordering and review of radiographic studies, ordering and performing treatments and interventions, pulse oximetry, re-evaluation of patient's condition and review of old charts     Medications Ordered in ED Medications  morphine (PF) 4 MG/ML injection 4 mg (has no administration in time range)  sodium chloride 0.9 % bolus 500 mL (has no administration in time range)  ceFEPIme (MAXIPIME) 1 g in sodium chloride 0.9 % 100 mL IVPB (has no administration in time range)  vancomycin (VANCOCIN) IVPB 1000 mg/200 mL premix (has no administration in time range)    Followed by  vancomycin (VANCOREADY) IVPB 1500 mg/300 mL (has no administration in time range)    ED Course/ Medical Decision Making/ A&P                             Medical Decision Making Amount and/or Complexity of Data Reviewed Labs: ordered. Radiology: ordered. ECG/medicine tests: ordered.  Risk OTC drugs. Prescription drug management. Decision regarding hospitalization.   BP (!) 142/112 (BP Location: Right Arm)   Pulse (!) 128   Temp 98.4 F (36.9 C) (Oral)   Resp 20   SpO2 99%   11:50 AM [name] is a 47 year old male who presents to the ED with chief complaint of foot pain x 2 days. The patient has had an ulcer on  the ball of his right foot for 2-3 months, but states the pain has acutely worsened over the last few days. The pain radiates from the bottom of his foot up his leg and he states the top of his foot is tender to the touch. Walking is incredibly painful and he typically has to limp to get around. The patient has been using a steroid cream he had from a previous skin issue, but states this has not improved the ulcer. He has not previously sought treatment for this ulcer and states he has never had anything like this before.   On exam this is an obese man laying in bed appears uncomfortable.  He is tachycardic, lungs without overt wheezes rales or rhonchi heard.  Right lower extremity is edematous erythematous with warmth noted throughout the lower extremity extending from the foot up to the mid calf.  He has a callus that was shaving now oozing out some discharge which was noted on the ball of his foot.  It is tender to palpation without any crepitance.  Palpable pedal pulse.  Skin of his leg is quite dry.  Vital signs notable for heart rate of 128.  Oral temperature is 98.4, no hypoxia, current blood pressure is 142/112.  Patient also reports that he tried shaving calluses on his right foot about a week ago and has noticed fluid oozing out since.  Patient received hemodialysis on Tuesday, Thursday, Saturday, last dialyzed today.   -Labs ordered, independently viewed and interpreted by me.  Labs remarkable for WBC 14.4, lactic acid 3.2.   -The patient was maintained on a cardiac monitor.  I personally viewed and interpreted the cardiac monitored which showed an underlying rhythm of: sinus tachycardia -Imaging independently viewed and interpreted by me and I agree with radiologist's interpretation.  Result remarkable for R foot xray unremarkable.  R shoulder with brderline widening of the [facility] joint without other acute finding.  CXR unremarkable -This patient presents to the ED for concern of foot pain,  this  involves an extensive number of treatment options, and is a complaint that carries with it a high risk of complications and morbidity.  The differential diagnosis includes cellulitis, foot ulcer, leg ischemia, abscess, osteomyelitis, DVT -Co morbidities that complicate the patient evaluation includes HTN, ESRD, CHF,  -Treatment includes sepsis protocol including gentle fluid due to hx of ESRD, and broad spectrum abx.  Pain medication given.  -Reevaluation of the patient after these medicines showed that the patient improved -PCP office notes or outside notes reviewed -Discussion with specialist [facility] Dr. Chipper Herb who agrees to see and will admit pt -Escalation to admission/observation considered: patient is agreeable with admission         Final Clinical Impression(s) / ED Diagnoses Final diagnoses:  Ulcer of right foot, unspecified ulcer stage (HCC)  Sepsis without acute organ dysfunction, due to unspecified organism [facility])    Rx / DC Orders ED Discharge Orders     None         Fayrene Helper, PA-C 12/06/22 1342    Lonell Grandchild, MD 12/06/22 [phone]

## 2022-12-06 NOTE — Progress Notes (Signed)
An USGPIV (ultrasound guided PIV) has been placed for short-term vasopressor infusion. A correctly placed ivWatch must be used when administering Vasopressors. Should this treatment be needed beyond 72 hours, central line access should be obtained.  It will be the responsibility of the bedside nurse to follow best practice to prevent extravasations.   

## 2022-12-06 NOTE — Progress Notes (Signed)
eLINk following for sepsis protocol.

## 2022-12-06 NOTE — Progress Notes (Signed)
Came bedside for echo, but nurse says he is going to MRI in 15 mins.

## 2022-12-06 NOTE — Consult Note (Signed)
NAME:  [name], MRN:  [id number], DOB:  [birth year], LOS: 0 ADMISSION DATE:  12/06/2022, CONSULTATION DATE:  12/06/22 REFERRING MD:  Dr. Tanya Nones, CHIEF COMPLAINT:  R foot pain   History of Present Illness:   47 year old male with PMH significant for ESRD on TTS iHD via R femoral TDC, on Eliquis for recurrent catheter/ clotted grafts and PAF, HFpEF/ cor pulmonale, OSA not on CPAP.  He denies hx of DM or heart problems.  Has had a right foot ulcer for 2 months.  Tried shaving the callus about a week ago and using a steroid cream at home but since started draining with increasing pain, warmth and redness.  Developed chills yesterday but denies fever.  Had full dialysis treatment this morning with ~1.5kg removed per pt with dry wt 140 kg but states his blood pressure has been dropping for "months" during dialysis.  His baseline SBP is 90 per pt, trying to stay off midodrine to be on transplant list.  One episode of N/V in ER but otherwise denies SOB, dizziness, syncope, CP, abd pain, or current N/V.  Additionally reports a dry cough for several weeks and right shoulder pain, thought he possibly re-injured from old football injury. Last dose Eliquis 6/28.   In ER, initially normotensive but since with SBP readings from 50-80.  Given NS 500 ml x 2 in ER.  Febrile 102.7 s/p tylenol, tachycardic, with normoxia, no respiratory distress. Labs significant for WBC 14.4, lactic 3.2> 2.7, K 5, bicarb 23.  CXR showing low lung volumes, R  shoulder XR borderline widening of AC joint otherwise negative for acute injury, right foot XR without acute findings, neg for subcutaneous gas.  Treated with vancomycin and cefepime.  Given persistent low blood pressure, PCCM consulted.   Pertinent  Medical History  ESRD on TTS iHD, PAF on Eliquis, HFpEF/ cor pulmonale, OSA not on CPAP, HTN  Denies ETOH, +THC, denies tobacco abuse  Significant Hospital Events: Including procedures, antibiotic start and stop dates in  addition to other pertinent events   6/29 admitted with sepsis > RLE foot cellulitis   Interim History / Subjective:   Objective   Blood pressure (!) 77/63, pulse (!) 124, temperature (!) 101.3 F (38.5 C), temperature source Oral, resp. rate 18, weight (!) 139.7 kg, SpO2 93 %.        Intake/Output Summary (Last 24 hours) at 12/06/2022 1431 Last data filed at 12/06/2022 1412 Gross per 24 hour  Intake 1091.8 ml  Output --  Net 1091.8 ml   Filed Weights   12/06/22 1246  Weight: (!) 139.7 kg    Examination: General:  Adult male lying in bed in NAD HEENT: MM pink/moist Neuro: alert, oriented x 4, MAE with SBP cuff reading 50-60's CV: rr, ST, distant heart sounds PULM:  non labored, clear, diminished in bases, room air 94% GI: obese, +bs, NT Extremities: warm/dry, TDC left femoral- site cdi, +1 BLE edema, some erythema and tenderness (more to dorsal) of right foot with lateral callus and round open ulceration to ball of foot, no current drainage Skin: no rashes  Resolved Hospital Problem list    Assessment & Plan:   Severe sepsis from right foot cellulitis - R foot XR neg  P:  - given variable BP's, admit to ICU for close monitoring.  Obtaining accurate BP measurement will be difficult given ESRD/ vasculopath, suspected PVD with multiple prior failed grafts/ catheters.  Goal SBP > 75, MAP > 55 WITH intact mental  status.  More accurate BP's obtaining on R forearm - peripheral NE prn - start midodrine 10mg  TID - albumin 25% x 1 now.  Would avoid further fluid boluses given hx of RV failure, could worsen hypotension.  - cont broad spectrum abx, vanc/ cefepime - follow cultures  - lactic acid down trending - pending MRI to rule out osteomyelitis  - podiatry consult pending MRI  - check ABI's - multimodal pain management/ bowel regimen - check HgbA1c/ SSI prn for goal CBG 140-180   HFpEF/ cor pulmonale  - prior TTE 11/2018> EF 55-60%, D-shaped septum suggestive of RV  pressure/ overload - currently at his dry wt but with some mild LE edema.  CXR neg - repeat echo  - volume removal w/ iHD  OSA not on CPAP - needs repeat study/ CPAP - CPAP prn at bedtime   ESRD on TTS iHD via R femoral TDC (last access option per pt)  - non urgent Nephrology consult> no urgent needs  - last iHD 6/29 am - trend renal indices   PAF on Eliquis  - currently ST.  Not on any rate controlling meds pta.  - cont eliquis   Morbid obesity, BMI 39.5 kg/m2 - rec outpt wt loss counseling   Best Practice (right click and "Reselect all SmartList Selections" daily)   Diet/type: Regular consistency (see orders) DVT prophylaxis: DOAC GI prophylaxis: N/A Lines: Dialysis Catheter Foley:  N/A Code Status:  full code Last date of multidisciplinary goals of care discussion [6/29]  Pt updated on plan of care.  NOK would be either parent.   Labs   CBC: Recent Labs  Lab 12/06/22 1053  WBC 14.4*  NEUTROABS 11.3*  HGB 12.6*  HCT 39.8  MCV 92.8  PLT 189    Basic Metabolic Panel: Recent Labs  Lab 12/06/22 1053  NA 133*  K 5.0  CL 91*  CO2 23  GLUCOSE 126*  BUN 34*  CREATININE 12.51*  CALCIUM 9.0   GFR: Estimated Creatinine Clearance: 10.7 mL/min (A) (by C-G formula based on SCr of 12.51 mg/dL (H)). Recent Labs  Lab 12/06/22 1052 12/06/22 1053 12/06/22 1253  WBC  --  14.4*  --   LATICACIDVEN 3.2*  --  2.7*    Liver Function Tests: Recent Labs  Lab 12/06/22 1053  AST 26  ALT 31  ALKPHOS 58  BILITOT 1.1  PROT 8.5*  ALBUMIN 3.8   No results for input(s): "LIPASE", "AMYLASE" in the last 168 hours. No results for input(s): "AMMONIA" in the last 168 hours.  ABG    Component Value Date/Time   PHART 7.415 12/06/2018 1420   PCO2ART 41.3 12/06/2018 1420   PO2ART 102.0 12/06/2018 1420   HCO3 26.5 12/06/2018 1420   TCO2 24 06/13/2022 0744   ACIDBASEDEF 8.0 (H) 12/01/2018 1656   O2SAT 98.0 12/06/2018 1420     Coagulation Profile: No results for  input(s): "INR", "PROTIME" in the last 168 hours.  Cardiac Enzymes: No results for input(s): "CKTOTAL", "CKMB", "CKMBINDEX", "TROPONINI" in the last 168 hours.  HbA1C: Hgb A1c MFr Bld  Date/Time Value Ref Range Status  12/11/2021 10:58 AM 5.3 4.6 - 6.5 % Final    Comment:    Glycemic Control Guidelines for People with Diabetes:Non Diabetic:  <6%Goal of Therapy: <7%Additional Action Suggested:  >8%   12/05/2020 10:21 AM 6.5 4.6 - 6.5 % Final    Comment:    Glycemic Control Guidelines for People with Diabetes:Non Diabetic:  <6%Goal of Therapy: <7%Additional Action Suggested:  >  8%     CBG: No results for input(s): "GLUCAP" in the last 168 hours.  Review of Systems:   Review of Systems  Constitutional:  Positive for chills. Negative for fever.  Respiratory:  Positive for cough. Negative for hemoptysis, sputum production, shortness of breath and wheezing.   Cardiovascular:  Negative for chest pain and leg swelling.  Gastrointestinal:  Positive for nausea and vomiting. Negative for abdominal pain, blood in stool, constipation and diarrhea.  Musculoskeletal:        +R shoulder and R foot pain  Neurological:  Negative for dizziness, focal weakness and loss of consciousness.   Past Medical History:  He,  has a past medical history of Acute respiratory failure with hypoxia (HCC) (11/30/2018), Anemia, ESRD on hemodialysis (HCC) (06/07/2011), History of blood transfusion, Hypertension, Morbid obesity (HCC), Paroxysmal atrial fibrillation (HCC), Pre-diabetes, Sepsis (HCC) (11/30/2018), and Sleep apnea.   Surgical History:   Past Surgical History:  Procedure Laterality Date   ARTERIOVENOUS GRAFT PLACEMENT  08/09/2010   Left Thigh Graft by Dr. Cari Caraway   AV FISTULA PLACEMENT Right 05/18/2018   Procedure: INSERTION OF ARTERIOVENOUS (AV) GORE-TEX GRAFT Left THIGH;  Surgeon: Maeola Harman, MD;  Location: [facility] OR;  Service: Vascular;  Laterality: Right;   AV FISTULA PLACEMENT  Right 02/15/2021   Procedure: INSERTION OF ARTERIOVENOUS (AV) GORE-TEX LOOP GRAFT RIGHT THIGH;  Surgeon: Maeola Harman, MD;  Location: [facility] OR;  Service: Vascular;  Laterality: Right;   BIOPSY  05/20/2022   Procedure: BIOPSY;  Surgeon: Shellia Cleverly, DO;  Location: [facility];  Service: Gastroenterology;;   CAPD INSERTION N/A 06/13/2022   Procedure: LAPAROSCOPIC INSERTION CONTINUOUS AMBULATORY PERITONEAL DIALYSIS  (CAPD) CATHETER;  Surgeon: Maeola Harman, MD;  Location: [facility] OR;  Service: Vascular;  Laterality: N/A;   CAPD REMOVAL  05/08/2011   Procedure: CONTINUOUS AMBULATORY PERITONEAL DIALYSIS  (CAPD) CATHETER REMOVAL;  Surgeon: Iona Coach, MD;  Location: [facility] OR;  Service: General;  Laterality: N/A;  Removal of CAPD catheter, Dr. requests to go after 100   CAPD REMOVAL N/A 08/08/2022   Procedure: REMOVAL CONTINUOUS AMBULATORY PERITONEAL DIALYSIS  (CAPD) CATHETER;  Surgeon: Maeola Harman, MD;  Location: [facility] OR;  Service: Vascular;  Laterality: N/A;   COLONOSCOPY WITH PROPOFOL N/A 05/20/2022   Procedure: COLONOSCOPY WITH PROPOFOL;  Surgeon: Shellia Cleverly, DO;  Location: [facility];  Service: Gastroenterology;  Laterality: N/A;   INSERTION OF DIALYSIS CATHETER  10/05/2010   Right Femoral Cath insertion by Dr. Leonides Sake.  Pt ahas had several caths inserted.   INSERTION OF DIALYSIS CATHETER Right 02/15/2021   Procedure: Attempted INSERTION OF RIGHT and Left Internal Jugular DIALYSIS CATHETER, Insertion of Left Femoral Vein Dialysis Catheter;  Surgeon: Maeola Harman, MD;  Location: [facility] OR;  Service: Vascular;  Laterality: Right;   INSERTION OF DIALYSIS CATHETER Right 04/26/2021   Procedure: INSERTION OF TUNNELED 55cm PALIDROME PRECISION CHRONIC DIALYSIS CATHETER;  Surgeon: Leonie Douglas, MD;  Location: [facility] OR;  Service: Vascular;  Laterality: Right;   INSERTION OF DIALYSIS CATHETER Left 12/26/2021   Procedure: INSERTION OF TUNNELED  DIALYSIS  CATHETER LEFT FEMORAL ARTERY;  Surgeon: Maeola Harman, MD;  Location: [facility] OR;  Service: Vascular;  Laterality: Left;   INSERTION OF DIALYSIS CATHETER Left 03/06/2022   Procedure: INSERTION OF DIALYSIS CATHETER;  Surgeon: Maeola Harman, MD;  Location: [facility] OR;  Service: Vascular;  Laterality: Left;   IR FLUORO GUIDE CV LINE LEFT  04/23/2022   IR FLUORO  GUIDE CV LINE RIGHT  05/11/2018   IR FLUORO GUIDE CV LINE RIGHT  07/03/2021   IR FLUORO GUIDE CV LINE RIGHT  07/16/2021   IR PTA ADDL CENTRAL DIALYSIS SEG THRU DIALY CIRCUIT RIGHT Right 07/03/2021   IR US GUIDE VASC ACCESS LEFT  04/23/2022   IR US GUIDE VASC ACCESS RIGHT  05/11/2018   IR VENO/EXT/UNI LEFT  04/23/2022   IR VENOCAVAGRAM IVC  07/16/2021   KIDNEY TRANSPLANT  2014   KNEE ARTHROSCOPY Left    LAPAROSCOPIC LYSIS OF ADHESIONS N/A 06/13/2022   Procedure: LAPAROSCOPIC LYSIS OF ADHESIONS;  Surgeon: Maeola Harman, MD;  Location: [facility] OR;  Service: Vascular;  Laterality: N/A;   POLYPECTOMY  05/20/2022   Procedure: POLYPECTOMY;  Surgeon: Shellia Cleverly, DO;  Location: [facility];  Service: Gastroenterology;;   THROMBECTOMY W/ EMBOLECTOMY Right 04/26/2021   Procedure: REMOVAL OF RIGHT THIGH ARTERIOVENOUS GORE-TEX GRAFT AND REPAIR OF RIGHT COMMON FEMORAL ARTERY;  Surgeon: Leonie Douglas, MD;  Location: [facility] OR;  Service: Vascular;  Laterality: Right;   UPPER EXTREMITY ANGIOGRAPHY Bilateral 05/13/2018   Procedure: UPPER EXTREMITY ANGIOGRAPHY - bilarteral;  Surgeon: Cephus Shelling, MD;  Location: [facility] INVAS[facility] CV LAB;  Service: Cardiovascular;  Laterality: Bilateral;     Social History:   reports that he quit smoking about 7 years ago. His smoking use included cigarettes. He has never used smokeless tobacco. He reports that he does not currently use alcohol. He reports that he does not currently use drugs after having used the following drugs: Marijuana.   Family History:  His family history includes Diabetes in  his father; Stroke in his father and maternal grandmother. There is no history of Anesthesia problems.   Allergies No Known Allergies   Home Medications  Prior to Admission medications   Medication Sig Start Date End Date Taking? Authorizing Provider  apixaban (ELIQUIS) 5 MG TABS tablet Take 5 mg by mouth 2 (two) times daily. 10/10/21  Yes [provider]  atorvastatin (LIPITOR) 40 MG tablet Take 1 tablet (40 mg total) by mouth daily. 12/20/21  Yes [name], MD  B Complex-C-Zn-Folic Acid (DIALYVITE/ZINC) TABS Take 1 tablet by mouth daily. 01/16/22  Yes [provider]  Calcium Carb-Cholecalciferol (CALCIUM 600 + D PO) Take 2 tablets by mouth daily.   Yes [provider]  ferric citrate (AURYXIA) 1 GM 210 MG(Fe) tablet Take 630 mg by mouth in the morning, at noon, and at bedtime. 09/06/19  Yes [provider]  oxyCODONE-acetaminophen (PERCOCET) 5-325 MG tablet Take 1 tablet by mouth every 6 (six) hours as needed for severe pain. Patient not taking: Reported on 12/06/2022 08/08/22   Dara Lords, PA-C     Critical care time: 55 mins     Posey Boyer, MSN, NP, AG-ACNP-BC [facility] 12/06/2022, 2:31 PM  See Amion for pager If no response to pager , please call [phone] until 7pm After 7:00 pm call Elink  [phone]

## 2022-12-07 ENCOUNTER — Inpatient Hospital Stay (HOSPITAL_COMMUNITY): Payer: Medicare HMO

## 2022-12-07 ENCOUNTER — Encounter (HOSPITAL_COMMUNITY): Payer: Self-pay | Admitting: Pulmonary Disease

## 2022-12-07 DIAGNOSIS — I10 Essential (primary) hypertension: Secondary | ICD-10-CM

## 2022-12-07 DIAGNOSIS — A419 Sepsis, unspecified organism: Secondary | ICD-10-CM | POA: Diagnosis not present

## 2022-12-07 DIAGNOSIS — L03031 Cellulitis of right toe: Secondary | ICD-10-CM

## 2022-12-07 DIAGNOSIS — Z992 Dependence on renal dialysis: Secondary | ICD-10-CM | POA: Diagnosis not present

## 2022-12-07 DIAGNOSIS — N186 End stage renal disease: Secondary | ICD-10-CM | POA: Diagnosis not present

## 2022-12-07 DIAGNOSIS — M86171 Other acute osteomyelitis, right ankle and foot: Secondary | ICD-10-CM

## 2022-12-07 DIAGNOSIS — N041 Nephrotic syndrome with focal and segmental glomerular lesions: Secondary | ICD-10-CM | POA: Diagnosis not present

## 2022-12-07 DIAGNOSIS — L02611 Cutaneous abscess of right foot: Secondary | ICD-10-CM

## 2022-12-07 LAB — BLOOD CULTURE ID PANEL (REFLEXED) - BCID2

## 2022-12-07 LAB — CBC
HCT: 32.9 % — ABNORMAL LOW (ref 39.0–52.0)
Hemoglobin: 10.5 g/dL — ABNORMAL LOW (ref 13.0–17.0)
MCH: 30.2 pg (ref 26.0–34.0)
MCHC: 31.9 g/dL (ref 30.0–36.0)
MCV: 94.5 fL (ref 80.0–100.0)
Platelets: 182 10*3/uL (ref 150–400)
RBC: 3.48 MIL/uL — ABNORMAL LOW (ref 4.22–5.81)
RDW: 18.6 % — ABNORMAL HIGH (ref 11.5–15.5)
WBC: 16.1 10*3/uL — ABNORMAL HIGH (ref 4.0–10.5)
nRBC: 0.2 % (ref 0.0–0.2)

## 2022-12-07 LAB — ECHOCARDIOGRAM COMPLETE
Area-P 1/2: 3.87 cm2
Height: 74 in
S' Lateral: 2.7 cm
Weight: 5001.8 oz

## 2022-12-07 LAB — BASIC METABOLIC PANEL
Anion gap: 17 — ABNORMAL HIGH (ref 5–15)
BUN: 49 mg/dL — ABNORMAL HIGH (ref 6–20)
CO2: 23 mmol/L (ref 22–32)
Calcium: 9.3 mg/dL (ref 8.9–10.3)
Chloride: 92 mmol/L — ABNORMAL LOW (ref 98–111)
Creatinine, Ser: 14.47 mg/dL — ABNORMAL HIGH (ref 0.61–1.24)
GFR, Estimated: 4 mL/min — ABNORMAL LOW (ref >60)
Glucose, Bld: 125 mg/dL — ABNORMAL HIGH (ref 70–99)
Potassium: 4.9 mmol/L (ref 3.5–5.1)
Sodium: 132 mmol/L — ABNORMAL LOW (ref 135–145)

## 2022-12-07 LAB — CULTURE, BLOOD (ROUTINE X 2)

## 2022-12-07 LAB — GLUCOSE, CAPILLARY
Glucose-Capillary: 105 mg/dL — ABNORMAL HIGH (ref 70–99)
Glucose-Capillary: 114 mg/dL — ABNORMAL HIGH (ref 70–99)
Glucose-Capillary: 126 mg/dL — ABNORMAL HIGH (ref 70–99)
Glucose-Capillary: 128 mg/dL — ABNORMAL HIGH (ref 70–99)
Glucose-Capillary: 79 mg/dL (ref 70–99)
Glucose-Capillary: 90 mg/dL (ref 70–99)

## 2022-12-07 LAB — MAGNESIUM: Magnesium: 2.2 mg/dL (ref 1.7–2.4)

## 2022-12-07 LAB — PHOSPHORUS: Phosphorus: 6.3 mg/dL — ABNORMAL HIGH (ref 2.5–4.6)

## 2022-12-07 MED ORDER — OXYCODONE HCL 5 MG PO TABS
5.0000 mg | ORAL_TABLET | Freq: Four times a day (QID) | ORAL | Status: DC | PRN
  Administered 2022-12-08 – 2022-12-16 (×16): 5 mg via ORAL
  Filled 2022-12-07 (×16): qty 1

## 2022-12-07 MED ORDER — MIDODRINE HCL 5 MG PO TABS
5.0000 mg | ORAL_TABLET | Freq: Once | ORAL | Status: AC
  Administered 2022-12-07: 5 mg via ORAL
  Filled 2022-12-07: qty 1

## 2022-12-07 MED ORDER — FERRIC CITRATE 1 GM 210 MG(FE) PO TABS
630.0000 mg | ORAL_TABLET | Freq: Three times a day (TID) | ORAL | Status: DC
  Administered 2022-12-08 – 2022-12-15 (×16): 630 mg via ORAL
  Filled 2022-12-07 (×28): qty 3

## 2022-12-07 MED ORDER — MIDODRINE HCL 5 MG PO TABS
15.0000 mg | ORAL_TABLET | Freq: Three times a day (TID) | ORAL | Status: DC
  Administered 2022-12-07 – 2022-12-16 (×28): 15 mg via ORAL
  Filled 2022-12-07 (×27): qty 3

## 2022-12-07 MED ORDER — HYDROMORPHONE HCL 1 MG/ML IJ SOLN
0.5000 mg | INTRAMUSCULAR | Status: DC | PRN
  Administered 2022-12-07 – 2022-12-08 (×4): 1 mg via INTRAVENOUS
  Administered 2022-12-09 – 2022-12-10 (×3): 0.5 mg via INTRAVENOUS
  Administered 2022-12-12 – 2022-12-16 (×9): 1 mg via INTRAVENOUS
  Filled 2022-12-07: qty 0.5
  Filled 2022-12-07 (×10): qty 1
  Filled 2022-12-07: qty 0.5
  Filled 2022-12-07: qty 1
  Filled 2022-12-07: qty 0.5
  Filled 2022-12-07 (×2): qty 1

## 2022-12-07 MED ORDER — SODIUM CHLORIDE 0.9 % IV BOLUS
250.0000 mL | Freq: Once | INTRAVENOUS | Status: AC
  Administered 2022-12-07: 250 mL via INTRAVENOUS

## 2022-12-07 MED ORDER — ORAL CARE MOUTH RINSE: 15.0000 mL | OROMUCOSAL | Status: DC | PRN

## 2022-12-07 MED ORDER — DOXERCALCIFEROL 4 MCG/2ML IV SOLN
6.0000 ug | INTRAVENOUS | Status: DC
  Administered 2022-12-09 – 2022-12-11 (×2): 6 ug via INTRAVENOUS
  Administered 2022-12-13: 3 ug via INTRAVENOUS
  Administered 2022-12-16: 6 ug via INTRAVENOUS
  Filled 2022-12-07 (×9): qty 4

## 2022-12-07 NOTE — Progress Notes (Signed)
Pts BP consistently reading 60s/40s-40s/30s with a MAP in the 30s-40s. BP consistent even when patient is awake. Patient A&O x4. Levo gtt started at 2325. Increased to 5 mcg/min. Patsy Lager PA at bedside. Patient is refusing a-line at this time.

## 2022-12-07 NOTE — Progress Notes (Signed)
Patient complaining that the doctors are not doing anything to treat his foot infection and pain. Patient stated " I might as well leave and go to an outpatient clinic to get antibiotics if I'm going to be just sitting around all day." I explained that pain medication have a chance of bottoming out his blood pressure and with his vasculature its hard to get an accurate reading. Patient wishes to speak with the provider. Elink was notified.   Beatrix Shipper

## 2022-12-07 NOTE — Consult Note (Signed)
PODIATRY CONSULTATION  NAME [name] MRN [id number] DOB January 05, 1976 DOA 12/06/2022   Reason for consult:  Chief Complaint  Patient presents with   Wound Check   Foot Pain    Consulting physician: Selmer Dominion NP  History of present illness: 47 y.o. male PMHx ESRD on hemodialysis, chronic hypotension associated to HD, PAF on Eliquis, CHF admitted for worsening infection right foot.  Last seen in the podiatry office 08/06/2022 for unrelated ulcer to the left great toe.  Last documented A1c 01/22/2022 was 5.4.  Noticed the right foot ulcer for about 2 months.  Worsening onset about a week ago.  He tried to debride the lesion himself and noticed purulent drainage.  Increased pain with fever and chills over the last 48 hours.  ED Course: Fever 102.7, tachycardia parameters showed elevated lactic acid 3.2, creatinine 12.5, K5.0, WBC 14.4.   Patient was given 500 mL of IV bolus and started IV antibiotics cefepime and vancomycin.  X-ray showed soft tissue swelling but no bony erosions.  Past Medical History:  Diagnosis Date   Acute respiratory failure with hypoxia (HCC) 11/30/2018   Anemia    ESRD   ESRD on hemodialysis (HCC) 06/07/2011   TTS [place]. Started HD in 2006, got transplant in 2014 lasted until Dec 2019 then went back on HD.  Has L thigh AVG as of Jun 2020.  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    History of blood transfusion    Hypertension    03/19/22- has not had high blood pressure in 5 years.   Morbid obesity (HCC)    Paroxysmal atrial fibrillation (HCC)    Pre-diabetes    no meds   Sepsis (HCC) 11/30/2018   Sleep apnea    lost weight, does not use CPAP       Latest Ref Rng & Units 12/07/2022    6:46 AM 12/06/2022   10:53 AM 06/13/2022    7:44 AM  CBC  WBC 4.0 - 10.5 K/uL 16.1  14.4    Hemoglobin 13.0 - 17.0 g/dL 40.9  81.1  91.4   Hematocrit 39.0 - 52.0 % 32.9  39.8  43.0   Platelets 150 - 400 K/uL 182  189         Latest Ref Rng &  Units 12/07/2022    6:46 AM 12/06/2022   10:53 AM 06/13/2022    7:50 AM  BMP  Glucose 70 - 99 mg/dL 782  956    BUN 6 - 20 mg/dL 49  34    Creatinine 2.13 - 1.24 mg/dL 08.65  78.46    Sodium 135 - 145 mmol/L 132  133    Potassium 3.5 - 5.1 mmol/L 4.9  5.0  5.0   Chloride 98 - 111 mmol/L 92  91    CO2 22 - 32 mmol/L 23  23    Calcium 8.9 - 10.3 mg/dL 9.3  9.0      LT great toe  RT foot   Physical Exam: General: The patient is alert and oriented x3 in no acute distress.   Dermatology: The hyperkeratotic callus and ulcer to the left great toe appear stable and noncontributory to the patient's systemic sepsis Callus with plantar foot ulcer noted to the first MTP of the right foot.  No appreciable drainage today.  It appears very dry and stable.   Heavy edema with erythema noted to the foot extending into the ankle. Pain with palpation throughout the foot.  No appreciable crepitus with palpation of the foot.  Vascular:  VAS [place] ABI W/WO TBI 07/25/2022  ABI Findings:  +---------+------------------+-----+---------+--------+  Right   Rt Pressure (mmHg)IndexWaveform Comment   +---------+------------------+-----+---------+--------+  Brachial 94                                        +---------+------------------+-----+---------+--------+  PTA     111               1.13 triphasic          +---------+------------------+-----+---------+--------+  DP      103               1.05 triphasic          +---------+------------------+-----+---------+--------+  Great Toe103               1.05                    +---------+------------------+-----+---------+--------+   +---------+------------------+-----+---------------------------+-------+  Left    Lt Pressure (mmHg)IndexWaveform                   Comment  +---------+------------------+-----+---------------------------+-------+  Brachial 98                                                          +---------+------------------+-----+---------------------------+-------+  PTA     112               1.14 triphasic                           +---------+------------------+-----+---------------------------+-------+  DP      99                1.01 triphasic                           +---------+------------------+-----+---------------------------+-------+  Great Toe                       2nd toe waveform is present         +---------+------------------+-----+---------------------------+-------+   +-------+-----------+--------------------------------+------------+--------  ----+  ABI/TBIToday's ABIToday's TBI                     Previous  ABIPrevious TBI  +-------+-----------+--------------------------------+------------+--------  ----+  Right 1.13       1.05                            1.24        0.66           +-------+-----------+--------------------------------+------------+--------  ----+  Left  1.14       great toe ulcer/2nd toe         1.16        1.16                             waveforms present.                                         +-------+-----------+--------------------------------+------------+--------  ----+  Previous ABI at [facility] on 12/28/20.  Summary:  Right: Resting right ankle-brachial index is within normal range. The right toe-brachial index is normal.  Left: Resting left ankle-brachial index is within normal range. Great toe ulcer. Second toe waveform is present.   Neurological: Grossly intact via light touch  Musculoskeletal Exam: Hallux valgus bilateral.  No prior amputations.  Patient ambulatory  DG FOOT COMPLETE RIGHT 12/06/2022 FINDINGS: No fracture or dislocation. Normal mineralization. Mild hallux valgus deformity. Small calcaneal spurs. No subcutaneous gas or radiodense foreign body.   IMPRESSION: 1. No acute findings. 2. Mild hallux valgus.  MR FOOT RIGHT WO CONTRAST 12/06/2022 IMPRESSION: 1. Small  plantar skin ulcer at the level of the great toe metatarsophalangeal joint with associated soft tissue edema and swelling about the medial greater than lateral aspects of the great toe and medial greater than lateral aspects of the midfoot consistent with cellulitis. There are regions of coalescing fluid medial to the great toe metatarsophalangeal joints and dorsal medial to the length of the proximal phalanx of the great toe suspicious for abscesses. 2. No cortical erosion or marrow edema is seen to indicate MRI evidence of acute osteomyelitis.   ASSESSMENT/PLAN OF CARE Cellulitis with possible multiple small abscesses right foot  -Patient seen at bedside.  -Continue cefepime 1g Q24H as ordered.  -If there is significant improvement of the cellulitis and erythema and edema of the foot we will hold off on surgical intervention and observe. -Informed the patient that if there is not significant improvement of the cellulitis and erythema of the foot over the next 24-48 hours he may benefit from incision and drainage of the possibly suspected small abscesses within the dorsum of the right foot. -Will plan to reevaluate tomorrow.  If no improvement may proceed with incision and drainage tomorrow, 12/08/2022, after 5 PM. Just in case we decide for incision and drainage, will place the patient NPO after full breakfast.     Thank you for the consult.  Please contact me directly via secure chat with any questions or concerns.     Felecia Shelling, DPM [facility]  Dr. Felecia Shelling, DPM    [address] [place], [place] [address]                Office [phone]  Fax [phone]

## 2022-12-07 NOTE — Progress Notes (Signed)
Echocardiogram 2D Echocardiogram has been performed.  Warren Lacy Jusitn Salsgiver RDCS 12/07/2022, 3:19 PM

## 2022-12-07 NOTE — Progress Notes (Signed)
   12/07/22 2311  BiPAP/CPAP/SIPAP  BiPAP/CPAP/SIPAP Pt Type Adult  Reason BIPAP/CPAP not in use Non-compliant

## 2022-12-07 NOTE — Progress Notes (Signed)
Pt's baseline SBP is normally 70-90 in outpt setting after discussing with Nephrology.  Stop NE, continue midodrine for now.  Monitor for any altered mental status with low Bps, thus far has and continues to be asymptomatic.       Posey Boyer, MSN, NP, AG-ACNP-BC [facility] 12/07/2022, 3:28 PM  See Amion for pager If no response to pager , please call [phone] until 7pm After 7:00 pm call Elink  [phone]

## 2022-12-07 NOTE — Progress Notes (Addendum)
PHARMACY - PHYSICIAN COMMUNICATION CRITICAL VALUE ALERT - BLOOD CULTURE IDENTIFICATION (BCID)  [name] is an 47 y.o. male who presented to [facility] on 12/06/2022 with a chief complaint of sepsis secondary to cellulitis. Podiatry is consulted.  Assessment:  1 of 3 blood cultures with staph epi - mecA/C resistance  Name of physician (or Provider) Contacted: Icard, DO  Current antibiotics: Vancomycin + Cefepime  Changes to prescribed antibiotics recommended:  Continue vancomycin + cefepime given concern for abscess. F/u podiatry recommendations and if I&D occcurs.  Results for orders placed or performed during the hospital encounter of 12/06/22  Blood Culture ID Panel (Reflexed) (Collected: 12/06/2022 12:45 PM)  Result Value Ref Range   Enterococcus faecalis NOT DETECTED NOT DETECTED   Enterococcus Faecium NOT DETECTED NOT DETECTED   Listeria monocytogenes NOT DETECTED NOT DETECTED   Staphylococcus species DETECTED (A) NOT DETECTED   Staphylococcus aureus (BCID) NOT DETECTED NOT DETECTED   Staphylococcus epidermidis DETECTED (A) NOT DETECTED   Staphylococcus lugdunensis NOT DETECTED NOT DETECTED   Streptococcus species NOT DETECTED NOT DETECTED   Streptococcus agalactiae NOT DETECTED NOT DETECTED   Streptococcus pneumoniae NOT DETECTED NOT DETECTED   Streptococcus pyogenes NOT DETECTED NOT DETECTED   A.calcoaceticus-baumannii NOT DETECTED NOT DETECTED   Bacteroides fragilis NOT DETECTED NOT DETECTED   Enterobacterales NOT DETECTED NOT DETECTED   Enterobacter cloacae complex NOT DETECTED NOT DETECTED   Escherichia coli NOT DETECTED NOT DETECTED   Klebsiella aerogenes NOT DETECTED NOT DETECTED   Klebsiella oxytoca NOT DETECTED NOT DETECTED   Klebsiella pneumoniae NOT DETECTED NOT DETECTED   Proteus species NOT DETECTED NOT DETECTED   Salmonella species NOT DETECTED NOT DETECTED   Serratia marcescens NOT DETECTED NOT DETECTED   Haemophilus influenzae NOT DETECTED NOT  DETECTED   Neisseria meningitidis NOT DETECTED NOT DETECTED   Pseudomonas aeruginosa NOT DETECTED NOT DETECTED   Stenotrophomonas maltophilia NOT DETECTED NOT DETECTED   Candida albicans NOT DETECTED NOT DETECTED   Candida auris NOT DETECTED NOT DETECTED   Candida glabrata NOT DETECTED NOT DETECTED   Candida krusei NOT DETECTED NOT DETECTED   Candida parapsilosis NOT DETECTED NOT DETECTED   Candida tropicalis NOT DETECTED NOT DETECTED   Cryptococcus neoformans/gattii NOT DETECTED NOT DETECTED   Methicillin resistance mecA/C DETECTED (A) NOT DETECTED    Ellis Savage, PharmD Clinical Pharmacist 12/07/2022  6:08 PM

## 2022-12-07 NOTE — Consult Note (Signed)
Renal Service Consult Note [facility]  Muad Kirch [facility] 12/07/2022 Maree Krabbe, MD Requesting Physician: Dr Tonia Brooms  Reason for Consult: ESRD pt w/ R foot infection and hypotension HPI: The patient is a 47 y.o. year-old w/ PMH as below who presented to ED yesterday 6/29 am with c/o R foot pain and swelling 10/10. Pt had his HD earlier in the day yesterday (TTS dialysis). In ED BP's were low in the 70s- 80s, temp 103 deg F, HR 110-124, RR 18-26 per min. WBC 16K, Hb 12.6, alb 3.8 , K+ 5.0, Na 133, BUN 34, creat 12.5. Pt was given IV cefepime and vancomycin. Pt was admitted to [facility] team but then was moved to ICU / CCM due to hypotension. Started on low-dose pressors.  We are asked to see for esrd.    Patient seen in ICU room.  He states that his usual BP's at OP HD are in the 70s-80s- 90s.  Occasionally drops in the 60s or bumps up to the 100s. He is not symptomatic w/ these pressures.   Pt currently w/o any complaints. Looks like he had a renal transplant in 2014, and went back on dialysis in around 2022. He went back on HD around then. He had PD catheter placed in Jan 2024 and it was removed in march 2024.    Patient has hx of pulm HTN and RV failure.  Also HFpEF.    ROS - denies CP, no joint pain, no HA, no blurry vision, no rash, no diarrhea, no nausea/ vomiting   Past Medical History  Past Medical History:  Diagnosis Date   Acute respiratory failure with hypoxia (HCC) 11/30/2018   Anemia    ESRD   ESRD on hemodialysis (HCC) 06/07/2011   TTS [place]. Started HD in 2006, got transplant in 2014 lasted until Dec 2019 then went back on HD.  Has L thigh AVG as of Jun 2020.  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    History of blood transfusion    Hypertension    03/19/22- has not had high blood pressure in 5 years.   Morbid obesity (HCC)    Paroxysmal atrial fibrillation (HCC)    Pre-diabetes    no meds   Sepsis (HCC) 11/30/2018   Sleep apnea     lost weight, does not use CPAP   Past Surgical History  Past Surgical History:  Procedure Laterality Date   ARTERIOVENOUS GRAFT PLACEMENT  08/09/2010   Left Thigh Graft by Dr. Cari Caraway   AV FISTULA PLACEMENT Right 05/18/2018   Procedure: INSERTION OF ARTERIOVENOUS (AV) GORE-TEX GRAFT Left THIGH;  Surgeon: Maeola Harman, MD;  Location: [facility] OR;  Service: Vascular;  Laterality: Right;   AV FISTULA PLACEMENT Right 02/15/2021   Procedure: INSERTION OF ARTERIOVENOUS (AV) GORE-TEX LOOP GRAFT RIGHT THIGH;  Surgeon: Maeola Harman, MD;  Location: [facility] OR;  Service: Vascular;  Laterality: Right;   BIOPSY  05/20/2022   Procedure: BIOPSY;  Surgeon: Shellia Cleverly, DO;  Location: [facility] ENDOSCOPY;  Service: Gastroenterology;;   CAPD INSERTION N/A 06/13/2022   Procedure: LAPAROSCOPIC INSERTION CONTINUOUS AMBULATORY PERITONEAL DIALYSIS  (CAPD) CATHETER;  Surgeon: Maeola Harman, MD;  Location: [facility] OR;  Service: Vascular;  Laterality: N/A;   CAPD REMOVAL  05/08/2011   Procedure: CONTINUOUS AMBULATORY PERITONEAL DIALYSIS  (CAPD) CATHETER REMOVAL;  Surgeon: Iona Coach, MD;  Location: [facility] OR;  Service: General;  Laterality: N/A;  Removal of CAPD catheter, Dr. requests to  go after 100   CAPD REMOVAL N/A 08/08/2022   Procedure: REMOVAL CONTINUOUS AMBULATORY PERITONEAL DIALYSIS  (CAPD) CATHETER;  Surgeon: Maeola Harman, MD;  Location: [facility] OR;  Service: Vascular;  Laterality: N/A;   COLONOSCOPY WITH PROPOFOL N/A 05/20/2022   Procedure: COLONOSCOPY WITH PROPOFOL;  Surgeon: Shellia Cleverly, DO;  Location: [facility];  Service: Gastroenterology;  Laterality: N/A;   INSERTION OF DIALYSIS CATHETER  10/05/2010   Right Femoral Cath insertion by Dr. Leonides Sake.  Pt ahas had several caths inserted.   INSERTION OF DIALYSIS CATHETER Right 02/15/2021   Procedure: Attempted INSERTION OF RIGHT and Left Internal Jugular DIALYSIS CATHETER, Insertion of Left Femoral Vein Dialysis  Catheter;  Surgeon: Maeola Harman, MD;  Location: [facility] OR;  Service: Vascular;  Laterality: Right;   INSERTION OF DIALYSIS CATHETER Right 04/26/2021   Procedure: INSERTION OF TUNNELED 55cm PALIDROME PRECISION CHRONIC DIALYSIS CATHETER;  Surgeon: Leonie Douglas, MD;  Location: [facility] OR;  Service: Vascular;  Laterality: Right;   INSERTION OF DIALYSIS CATHETER Left 12/26/2021   Procedure: INSERTION OF TUNNELED  DIALYSIS CATHETER LEFT FEMORAL ARTERY;  Surgeon: Maeola Harman, MD;  Location: [facility] OR;  Service: Vascular;  Laterality: Left;   INSERTION OF DIALYSIS CATHETER Left 03/06/2022   Procedure: INSERTION OF DIALYSIS CATHETER;  Surgeon: Maeola Harman, MD;  Location: [facility] OR;  Service: Vascular;  Laterality: Left;   IR FLUORO GUIDE CV LINE LEFT  04/23/2022   IR FLUORO GUIDE CV LINE RIGHT  05/11/2018   IR FLUORO GUIDE CV LINE RIGHT  07/03/2021   IR FLUORO GUIDE CV LINE RIGHT  07/16/2021   IR PTA ADDL CENTRAL DIALYSIS SEG THRU DIALY CIRCUIT RIGHT Right 07/03/2021   IR US GUIDE VASC ACCESS LEFT  04/23/2022   IR US GUIDE VASC ACCESS RIGHT  05/11/2018   IR VENO/EXT/UNI LEFT  04/23/2022   IR VENOCAVAGRAM IVC  07/16/2021   KIDNEY TRANSPLANT  2014   KNEE ARTHROSCOPY Left    LAPAROSCOPIC LYSIS OF ADHESIONS N/A 06/13/2022   Procedure: LAPAROSCOPIC LYSIS OF ADHESIONS;  Surgeon: Maeola Harman, MD;  Location: [facility] OR;  Service: Vascular;  Laterality: N/A;   POLYPECTOMY  05/20/2022   Procedure: POLYPECTOMY;  Surgeon: Shellia Cleverly, DO;  Location: [facility];  Service: Gastroenterology;;   THROMBECTOMY W/ EMBOLECTOMY Right 04/26/2021   Procedure: REMOVAL OF RIGHT THIGH ARTERIOVENOUS GORE-TEX GRAFT AND REPAIR OF RIGHT COMMON FEMORAL ARTERY;  Surgeon: Leonie Douglas, MD;  Location: [facility] OR;  Service: Vascular;  Laterality: Right;   UPPER EXTREMITY ANGIOGRAPHY Bilateral 05/13/2018   Procedure: UPPER EXTREMITY ANGIOGRAPHY - bilarteral;  Surgeon: Cephus Shelling, MD;   Location: [facility] INVASIVE CV LAB;  Service: Cardiovascular;  Laterality: Bilateral;   Family History  Family History  Problem Relation Age of Onset   Diabetes Father    Stroke Father    Stroke Maternal Grandmother    Anesthesia problems Neg Hx    Social History  reports that he quit smoking about 7 years ago. His smoking use included cigarettes. He has never used smokeless tobacco. He reports that he does not currently use alcohol. He reports that he does not currently use drugs after having used the following drugs: Marijuana. Allergies No Known Allergies Home medications Prior to Admission medications   Medication Sig Start Date End Date Taking? Authorizing Provider  apixaban (ELIQUIS) 5 MG TABS tablet Take 5 mg by mouth 2 (two) times daily. 10/10/21  Yes [provider]  atorvastatin (LIPITOR) 40 MG tablet  Take 1 tablet (40 mg total) by mouth daily. 12/20/21  Yes [name], MD  B Complex-C-Zn-Folic Acid (DIALYVITE/ZINC) TABS Take 1 tablet by mouth daily. 01/16/22  Yes [provider]  Calcium Carb-Cholecalciferol (CALCIUM 600 + D PO) Take 2 tablets by mouth daily.   Yes [provider]  ferric citrate (AURYXIA) 1 GM 210 MG(Fe) tablet Take 630 mg by mouth in the morning, at noon, and at bedtime. 09/06/19  Yes [provider]  oxyCODONE-acetaminophen (PERCOCET) 5-325 MG tablet Take 1 tablet by mouth every 6 (six) hours as needed for severe pain. Patient not taking: Reported on 12/06/2022 08/08/22   Dara Lords, PA-C     Vitals:   12/07/22 1115 12/07/22 1130 12/07/22 1145 12/07/22 1200  BP: (!) 84/57 (!) 87/56 (!) 88/64 90/69  Pulse: 82 82 76 78  Resp: (!) 0 (!) 5 (!) 7 (!) 9  Temp: 98.1 F (36.7 C)     TempSrc: Oral     SpO2: 99% 97% 96% 100%  Weight:      Height:       Exam Gen alert, no distress, pleasant  No rash, cyanosis or gangrene Sclera anicteric, throat clear  No jvd or bruits Chest clear bilat to bases, no rales/ wheezing RRR  no MRG Abd soft ntnd no mass or ascites +bs GU normal male MS no joint effusions or deformity Ext no LE or UE edema Neuro is alert, Ox 3 , nf    L femoral TDC intact, clean exit site    Home meds include - eliquis, lipitor, dialyvite, auryxia 3 ac tid, percocet prn, prns/ vits/ supps     OP HD: TTS HD  4.5h   500/ 500  140kg   2/2 bath  TDC  Hep 5000+ - hectorol 6 mcg IV three times per week - parsabiv 7.5 mg IV three times per week - no esa   Assessment/ Plan: Sepsis - from R foot cellulitis, possible abscess. Getting IV abx, per pmd Hypotension - not sure acute and/ or chronic. One of our staff knows him well and his baseline BP's are usually in the 70s-80s, sometime 60s or 90s. In past has refused to take midodrine because he thought it would reduce his chance of getting a transplant. Today pt is alert and showing no signs of symptoms from hypotension. Start midodrine if pt will agree.  ESRD - on HD TTS. Has HD yesterday (6/29). Next HD 7/02.   Volume - exam looks euvolemic and only up 1-2 kg by wts.  Anemia esrd - Hb 10-12 here, follow.  MBD ckd - CCa in range, phos up a bit. Cont binders. Cont IV vdra.  PAF - on eliquis Morbid obesity HFpEF/ cor pulmonale      Vinson Moselle  MD [facility] 12/07/2022, 2:39 PM  Recent Labs  Lab 12/06/22 1053 12/07/22 0646  HGB 12.6* 10.5*  ALBUMIN 3.8  --   CALCIUM 9.0 9.3  PHOS  --  6.3*  CREATININE 12.51* 14.47*  K 5.0 4.9   Inpatient medications:  apixaban  5 mg Oral BID   atorvastatin  40 mg Oral Daily   Chlorhexidine Gluconate Cloth  6 each Topical Daily   insulin aspart  0-6 Units Subcutaneous Q4H   midodrine  15 mg Oral Q8H   mupirocin ointment  1 Application Nasal BID    sodium chloride     ceFEPime (MAXIPIME) IV Stopped (12/07/22 1255)   [START ON 12/09/2022] vancomycin  acetaminophen, docusate sodium, HYDROmorphone (DILAUDID) injection, ondansetron (ZOFRAN) IV, ondansetron **OR** [DISCONTINUED] ondansetron  (ZOFRAN) IV, mouth rinse, oxyCODONE, polyethylene glycol

## 2022-12-07 NOTE — Progress Notes (Signed)
eLink Physician-Brief Progress Note Patient Name: [name] DOB: [birth year] MRN: [id number]   Date of Service  12/07/2022  HPI/Events of Note  HD patient with chronic hypotension. Renal states normal systolic pressures 70's-80-90. Patient is complaining of foot wound pain but BSRN feels uncomfortable giving any PRN's on MAR concern for further dropping blood pressure and Tylenol is not helping. Current BP 78/50(59) HR 83  Norepinephrine was discontinued yesterday and midodrine started Most recent lactic acid 2.5 Patient refusing aline  eICU Interventions  Give 250 cc NS bolus Check lactic acid in AM OK to give pain meds and re-start pressors as needed. Monitor closely for infiltration and persistently needing pressors then will need a central line BSRN to speak with patient again regarding aline     Intervention Category Intermediate Interventions: Hypotension - evaluation and management  Darl Pikes 12/07/2022, 8:33 PM

## 2022-12-07 NOTE — Progress Notes (Signed)
NAME:  [name], MRN:  [id number], DOB:  03-14-1976, LOS: 1 ADMISSION DATE:  12/06/2022, CONSULTATION DATE:  12/06/22 REFERRING MD:  Dr. Tanya Nones, CHIEF COMPLAINT:  R foot pain   History of Present Illness:   47 year old male with PMH significant for ESRD on TTS iHD via R femoral TDC, on Eliquis for recurrent catheter/ clotted grafts and PAF, HFpEF/ cor pulmonale, OSA not on CPAP.  He denies hx of DM or heart problems.  Has had a right foot ulcer for 2 months.  Tried shaving the callus about a week ago and using a steroid cream at home but since started draining with increasing pain, warmth and redness.  Developed chills yesterday but denies fever.  Had full dialysis treatment this morning with ~1.5kg removed per pt with dry wt 140 kg but states his blood pressure has been dropping for "months" during dialysis.  His baseline SBP is 90 per pt, trying to stay off midodrine to be on transplant list.  One episode of N/V in ER but otherwise denies SOB, dizziness, syncope, CP, abd pain, or current N/V.  Additionally reports a dry cough for several weeks and right shoulder pain, thought he possibly re-injured from old football injury. Last dose Eliquis 6/28.   In ER, initially normotensive but since with SBP readings from 50-80.  Given NS 500 ml x 2 in ER.  Febrile 102.7 s/p tylenol, tachycardic, with normoxia, no respiratory distress. Labs significant for WBC 14.4, lactic 3.2> 2.7, K 5, bicarb 23.  CXR showing low lung volumes, R  shoulder XR borderline widening of AC joint otherwise negative for acute injury, right foot XR without acute findings, neg for subcutaneous gas.  Treated with vancomycin and cefepime.  Given persistent low blood pressure, PCCM consulted.   Pertinent  Medical History  ESRD on TTS iHD, PAF on Eliquis, HFpEF/ cor pulmonale, OSA not on CPAP, HTN  Denies ETOH, +THC, denies tobacco abuse  Significant Hospital Events: Including procedures, antibiotic start and stop dates in  addition to other pertinent events   6/29 admitted with sepsis > RLE foot cellulitis, hypotension with low SBP but no mental status changes, MRI   Interim History / Subjective:  Placed on NE overnight for SBP 60's, MAPs 30-40 without mental status changes.  Refused Aline.  Frustrated this morning because he reports his blood pressure is always low and he doesn't need the medicines.  MRI of right foot neg for osteomyelitis but suspicious for abscessess in the great toe metatarsophalangeal and proximal phalanx joints  Objective   Blood pressure 126/76, pulse 80, temperature 98.1 F (36.7 C), temperature source Oral, resp. rate 15, height 6\' 2"  (1.88 m), weight (!) 141.8 kg, SpO2 (!) 80 %.        Intake/Output Summary (Last 24 hours) at 12/07/2022 1008 Last data filed at 12/07/2022 0600 Gross per 24 hour  Intake 2090.7 ml  Output --  Net 2090.7 ml    Filed Weights   12/06/22 1246 12/06/22 1551 12/07/22 0500  Weight: (!) 139.7 kg (!) 141.3 kg (!) 141.8 kg    Examination: General:  Adult male sitting upright in bed in NAD HEENT: MM pink/moist Neuro: Aox 3, MAE CV: rr, NSR, left femoral TDC PULM:  non labored, CTA GI: obese, +bs, NT  Extremities: warm/dry, trace LE edema, improving erythema and sensitivity to RLE, ulceration to ball of right foot, no drainage Skin: no rashes   MRI R foot > 1. Small plantar skin ulcer at the  level of the great toe metatarsophalangeal joint with associated soft tissue edema and swelling about the medial greater than lateral aspects of the great toe and medial greater than lateral aspects of the midfoot consistent with cellulitis. There are regions of coalescing fluid medial to the great toe metatarsophalangeal joints and dorsal medial to the length of the proximal phalanx of the great toe suspicious for abscesses. 2. No cortical erosion or marrow edema is seen to indicate MRI evidence of acute osteomyelitis.  Resolved Hospital Problem list     Assessment & Plan:   Severe sepsis from right foot cellulitis, possible abscess  - R foot XR neg  - MRI as above, neg for osteo, possible R great abscess P:  - cont broad spectrum abx for now - Podiatry consulted, appreciate recommendations.  Will continue eliquis for now pending further recs - follow cultures - pain control w/ bowel management - pending ABIs - pending HA1c  Hypotension, chronic P:  - pt reports low normal SBP 90's but may be lower.  Nephrology to review outpt dialysis records for more accurate baseline.  Has not been symptomatic with SBP cuff reading into the 50's.  Suspect his BP is much better, just inaccurate readings given his PVD. Refused Aline.  - SBP goal > 70, MAP > 50 for now with intact mental status - wean NE for these goals - increase midodrine to 15mg  TID  HFpEF/ cor pulmonale  - prior TTE 11/2018> EF 55-60%, D-shaped septum suggestive of RV pressure/ overload - pending TTE   - volume removal w/ iHD  OSA not on CPAP - needs repeat study/ CPAP outpt - CPAP prn at bedtime, refusing  ESRD on TTS iHD via R femoral TDC (last access option per pt)  - appreciate Nephrology consult - last iHD 6/29 am - trend renal indices   PAF on Eliquis  - currently NSR.  Not on any rate controlling meds pta.  - cont eliquis pending further podiatry recs   Morbid obesity, BMI 39.5 kg/m2 - f/u outpt   Best Practice (right click and "Reselect all SmartList Selections" daily)   Diet/type: Regular consistency (see orders) DVT prophylaxis: DOAC GI prophylaxis: N/A Lines: Dialysis Catheter Foley:  N/A Code Status:  full code Last date of multidisciplinary goals of care discussion [6/29]  Pt updated on plan of care.   Labs   CBC: Recent Labs  Lab 12/06/22 1053 12/07/22 0646  WBC 14.4* 16.1*  NEUTROABS 11.3*  --   HGB 12.6* 10.5*  HCT 39.8 32.9*  MCV 92.8 94.5  PLT 189 182     Basic Metabolic Panel: Recent Labs  Lab 12/06/22 1053  12/07/22 0646  NA 133* 132*  K 5.0 4.9  CL 91* 92*  CO2 23 23  GLUCOSE 126* 125*  BUN 34* 49*  CREATININE 12.51* 14.47*  CALCIUM 9.0 9.3  MG  --  2.2  PHOS  --  6.3*    GFR: Estimated Creatinine Clearance: 9.4 mL/min (A) (by C-G formula based on SCr of 14.47 mg/dL (H)). Recent Labs  Lab 12/06/22 1052 12/06/22 1053 12/06/22 1253 12/07/22 0646  WBC  --  14.4*  --  16.1*  LATICACIDVEN 3.2*  --  2.7*  --      Liver Function Tests: Recent Labs  Lab 12/06/22 1053  AST 26  ALT 31  ALKPHOS 58  BILITOT 1.1  PROT 8.5*  ALBUMIN 3.8    No results for input(s): "LIPASE", "AMYLASE" in the last 168 hours. No  results for input(s): "AMMONIA" in the last 168 hours.  ABG    Component Value Date/Time   PHART 7.415 12/06/2018 1420   PCO2ART 41.3 12/06/2018 1420   PO2ART 102.0 12/06/2018 1420   HCO3 26.5 12/06/2018 1420   TCO2 24 06/13/2022 0744   ACIDBASEDEF 8.0 (H) 12/01/2018 1656   O2SAT 98.0 12/06/2018 1420     Coagulation Profile: No results for input(s): "INR", "PROTIME" in the last 168 hours.  Cardiac Enzymes: No results for input(s): "CKTOTAL", "CKMB", "CKMBINDEX", "TROPONINI" in the last 168 hours.  HbA1C: Hgb A1c MFr Bld  Date/Time Value Ref Range Status  12/11/2021 10:58 AM 5.3 4.6 - 6.5 % Final    Comment:    Glycemic Control Guidelines for People with Diabetes:Non Diabetic:  <6%Goal of Therapy: <7%Additional Action Suggested:  >8%   12/05/2020 10:21 AM 6.5 4.6 - 6.5 % Final    Comment:    Glycemic Control Guidelines for People with Diabetes:Non Diabetic:  <6%Goal of Therapy: <7%Additional Action Suggested:  >8%     CBG: Recent Labs  Lab 12/06/22 1619 12/06/22 1935 12/06/22 2329 12/07/22 0331 12/07/22 0714  GLUCAP 101* 151* 97 126* 105*   Critical care time: 32 mins     Posey Boyer, MSN, NP, AG-ACNP-BC [facility] 12/07/2022, 10:08 AM  See Amion for pager If no response to pager , please call [phone] until  7pm After 7:00 pm call Elink  [phone]

## 2022-12-07 NOTE — Progress Notes (Signed)
   12/07/22 0000  BiPAP/CPAP/SIPAP  BiPAP/CPAP/SIPAP Pt Type Adult  Reason BIPAP/CPAP not in use Non-compliant   States he does not wear one at home.

## 2022-12-08 ENCOUNTER — Inpatient Hospital Stay (HOSPITAL_COMMUNITY): Payer: Medicare HMO | Admitting: Certified Registered"

## 2022-12-08 ENCOUNTER — Inpatient Hospital Stay (HOSPITAL_COMMUNITY): Payer: Medicare HMO

## 2022-12-08 ENCOUNTER — Other Ambulatory Visit: Payer: Self-pay

## 2022-12-08 ENCOUNTER — Encounter (HOSPITAL_COMMUNITY): Payer: Self-pay | Admitting: Pulmonary Disease

## 2022-12-08 ENCOUNTER — Encounter (HOSPITAL_COMMUNITY)
Admission: EM | Disposition: A | Discharge status: Home / Self Care | Payer: Self-pay | Source: Home / Self Care | Attending: Internal Medicine

## 2022-12-08 DIAGNOSIS — L089 Local infection of the skin and subcutaneous tissue, unspecified: Secondary | ICD-10-CM | POA: Diagnosis not present

## 2022-12-08 DIAGNOSIS — Z992 Dependence on renal dialysis: Secondary | ICD-10-CM

## 2022-12-08 DIAGNOSIS — L97519 Non-pressure chronic ulcer of other part of right foot with unspecified severity: Secondary | ICD-10-CM | POA: Diagnosis not present

## 2022-12-08 DIAGNOSIS — I132 Hypertensive heart and chronic kidney disease with heart failure and with stage 5 chronic kidney disease, or end stage renal disease: Secondary | ICD-10-CM | POA: Diagnosis not present

## 2022-12-08 DIAGNOSIS — L02611 Cutaneous abscess of right foot: Secondary | ICD-10-CM

## 2022-12-08 DIAGNOSIS — M86171 Other acute osteomyelitis, right ankle and foot: Secondary | ICD-10-CM | POA: Diagnosis not present

## 2022-12-08 DIAGNOSIS — N186 End stage renal disease: Secondary | ICD-10-CM

## 2022-12-08 DIAGNOSIS — I509 Heart failure, unspecified: Secondary | ICD-10-CM | POA: Diagnosis not present

## 2022-12-08 DIAGNOSIS — Z87891 Personal history of nicotine dependence: Secondary | ICD-10-CM

## 2022-12-08 DIAGNOSIS — I10 Essential (primary) hypertension: Secondary | ICD-10-CM | POA: Diagnosis not present

## 2022-12-08 DIAGNOSIS — L97512 Non-pressure chronic ulcer of other part of right foot with fat layer exposed: Secondary | ICD-10-CM | POA: Diagnosis not present

## 2022-12-08 DIAGNOSIS — L03031 Cellulitis of right toe: Secondary | ICD-10-CM

## 2022-12-08 DIAGNOSIS — A419 Sepsis, unspecified organism: Secondary | ICD-10-CM | POA: Diagnosis not present

## 2022-12-08 HISTORY — PX: I & D EXTREMITY: SHX5045

## 2022-12-08 LAB — POCT I-STAT, CHEM 8
BUN: 64 mg/dL — ABNORMAL HIGH (ref 6–20)
Calcium, Ion: 1.01 mmol/L — ABNORMAL LOW (ref 1.15–1.40)
Chloride: 97 mmol/L — ABNORMAL LOW (ref 98–111)
Creatinine, Ser: 16.9 mg/dL — ABNORMAL HIGH (ref 0.61–1.24)
Glucose, Bld: 101 mg/dL — ABNORMAL HIGH (ref 70–99)
HCT: 37 % — ABNORMAL LOW (ref 39.0–52.0)
Hemoglobin: 12.6 g/dL — ABNORMAL LOW (ref 13.0–17.0)
Potassium: 6.1 mmol/L — ABNORMAL HIGH (ref 3.5–5.1)
Sodium: 127 mmol/L — ABNORMAL LOW (ref 135–145)
TCO2: 21 mmol/L — ABNORMAL LOW (ref 22–32)

## 2022-12-08 LAB — CULTURE, BLOOD (ROUTINE X 2)

## 2022-12-08 LAB — GLUCOSE, CAPILLARY
Glucose-Capillary: 89 mg/dL (ref 70–99)
Glucose-Capillary: 93 mg/dL (ref 70–99)
Glucose-Capillary: 90 mg/dL (ref 70–99)
Glucose-Capillary: 65 mg/dL — ABNORMAL LOW (ref 70–99)
Glucose-Capillary: 110 mg/dL — ABNORMAL HIGH (ref 70–99)
Glucose-Capillary: 67 mg/dL — ABNORMAL LOW (ref 70–99)

## 2022-12-08 LAB — BASIC METABOLIC PANEL
Anion gap: 18 — ABNORMAL HIGH (ref 5–15)
Anion gap: 17 — ABNORMAL HIGH (ref 5–15)
BUN: 61 mg/dL — ABNORMAL HIGH (ref 6–20)
BUN: 69 mg/dL — ABNORMAL HIGH (ref 6–20)
CO2: 20 mmol/L — ABNORMAL LOW (ref 22–32)
CO2: 22 mmol/L (ref 22–32)
Calcium: 9.2 mg/dL (ref 8.9–10.3)
Calcium: 9.1 mg/dL (ref 8.9–10.3)
Chloride: 92 mmol/L — ABNORMAL LOW (ref 98–111)
Chloride: 91 mmol/L — ABNORMAL LOW (ref 98–111)
Creatinine, Ser: 15.72 mg/dL — ABNORMAL HIGH (ref 0.61–1.24)
Creatinine, Ser: 16.85 mg/dL — ABNORMAL HIGH (ref 0.61–1.24)
GFR, Estimated: 3 mL/min — ABNORMAL LOW (ref >60)
GFR, Estimated: 3 mL/min — ABNORMAL LOW (ref >60)
Glucose, Bld: 99 mg/dL (ref 70–99)
Glucose, Bld: 108 mg/dL — ABNORMAL HIGH (ref 70–99)
Potassium: 5.5 mmol/L — ABNORMAL HIGH (ref 3.5–5.1)
Potassium: 5.4 mmol/L — ABNORMAL HIGH (ref 3.5–5.1)
Sodium: 130 mmol/L — ABNORMAL LOW (ref 135–145)
Sodium: 130 mmol/L — ABNORMAL LOW (ref 135–145)

## 2022-12-08 LAB — HEMOGLOBIN A1C
Hgb A1c MFr Bld: 5.7 % — ABNORMAL HIGH (ref 4.8–5.6)
Mean Plasma Glucose: 117 mg/dL

## 2022-12-08 LAB — HEPATITIS B SURFACE ANTIGEN: Hepatitis B Surface Ag: NONREACTIVE

## 2022-12-08 LAB — LACTIC ACID, PLASMA: Lactic Acid, Venous: 1 mmol/L (ref 0.5–1.9)

## 2022-12-08 SURGERY — IRRIGATION AND DEBRIDEMENT EXTREMITY
Anesthesia: General | Site: Foot | Laterality: Right

## 2022-12-08 MED ORDER — DEXTROSE 50 % IV SOLN
INTRAVENOUS | Status: AC
  Administered 2022-12-08: 12.5 g via INTRAVENOUS
  Filled 2022-12-08: qty 50

## 2022-12-08 MED ORDER — FENTANYL CITRATE (PF) 250 MCG/5ML IJ SOLN
INTRAMUSCULAR | Status: AC
  Filled 2022-12-08: qty 5

## 2022-12-08 MED ORDER — CHLORHEXIDINE GLUCONATE 0.12 % MT SOLN
OROMUCOSAL | Status: AC
  Administered 2022-12-08: 15 mL via OROMUCOSAL
  Filled 2022-12-08: qty 15

## 2022-12-08 MED ORDER — BUPIVACAINE HCL (PF) 0.5 % IJ SOLN
INTRAMUSCULAR | Status: DC | PRN
  Administered 2022-12-08: 10 mL

## 2022-12-08 MED ORDER — CHLORHEXIDINE GLUCONATE CLOTH 2 % EX PADS
6.0000 | MEDICATED_PAD | Freq: Every day | CUTANEOUS | Status: DC
  Administered 2022-12-09: 6 via TOPICAL

## 2022-12-08 MED ORDER — PROPOFOL 10 MG/ML IV BOLUS
INTRAVENOUS | Status: AC
  Filled 2022-12-08: qty 20

## 2022-12-08 MED ORDER — LIDOCAINE HCL 2 % IJ SOLN
INTRAMUSCULAR | Status: DC | PRN
  Administered 2022-12-08: 10 mL

## 2022-12-08 MED ORDER — MIDAZOLAM HCL 2 MG/2ML IJ SOLN
INTRAMUSCULAR | Status: AC
  Filled 2022-12-08: qty 2

## 2022-12-08 MED ORDER — CHLORHEXIDINE GLUCONATE 0.12 % MT SOLN: 15.0000 mL | Freq: Once | OROMUCOSAL | Status: AC

## 2022-12-08 MED ORDER — SODIUM ZIRCONIUM CYCLOSILICATE 10 G PO PACK
10.0000 g | PACK | Freq: Once | ORAL | Status: AC
  Administered 2022-12-08: 10 g via ORAL
  Filled 2022-12-08: qty 1

## 2022-12-08 MED ORDER — INSULIN ASPART 100 UNIT/ML IJ SOLN
INTRAMUSCULAR | Status: AC
  Administered 2022-12-08: 3 [IU] via INTRAVENOUS
  Filled 2022-12-08: qty 1

## 2022-12-08 MED ORDER — ROCURONIUM BROMIDE 10 MG/ML (PF) SYRINGE
PREFILLED_SYRINGE | INTRAVENOUS | Status: AC
  Filled 2022-12-08: qty 10

## 2022-12-08 MED ORDER — DEXTROSE 50 % IV SOLN
12.5000 g | INTRAVENOUS | Status: AC
  Filled 2022-12-08: qty 50

## 2022-12-08 MED ORDER — INSULIN ASPART 100 UNIT/ML IJ SOLN: 3.0000 [IU] | INTRAMUSCULAR | Status: AC

## 2022-12-08 MED ORDER — PHENYLEPHRINE 80 MCG/ML (10ML) SYRINGE FOR IV PUSH (FOR BLOOD PRESSURE SUPPORT)
PREFILLED_SYRINGE | INTRAVENOUS | Status: DC | PRN
  Administered 2022-12-08 (×3): 160 ug via INTRAVENOUS

## 2022-12-08 MED ORDER — SODIUM CHLORIDE 0.9 % IR SOLN
Status: DC | PRN
  Administered 2022-12-08: 3000 mL

## 2022-12-08 MED ORDER — MIDAZOLAM HCL 2 MG/2ML IJ SOLN
INTRAMUSCULAR | Status: DC | PRN
  Administered 2022-12-08 (×3): 1 mg via INTRAVENOUS
  Administered 2022-12-08 (×2): .5 mg via INTRAVENOUS

## 2022-12-08 MED ORDER — KETAMINE HCL 10 MG/ML IJ SOLN
INTRAMUSCULAR | Status: DC | PRN
  Administered 2022-12-08: 10 mg via INTRAVENOUS
  Administered 2022-12-08: 5 mg via INTRAVENOUS

## 2022-12-08 MED ORDER — PHENYLEPHRINE HCL-NACL 20-0.9 MG/250ML-% IV SOLN
INTRAVENOUS | Status: DC | PRN
  Administered 2022-12-08: 40 ug/min via INTRAVENOUS

## 2022-12-08 MED ORDER — ORAL CARE MOUTH RINSE: 15.0000 mL | Freq: Once | OROMUCOSAL | Status: AC

## 2022-12-08 MED ORDER — 0.9 % SODIUM CHLORIDE (POUR BTL) OPTIME
TOPICAL | Status: DC | PRN
  Administered 2022-12-08: 1000 mL

## 2022-12-08 MED ORDER — LIDOCAINE 2% (20 MG/ML) 5 ML SYRINGE
INTRAMUSCULAR | Status: AC
  Filled 2022-12-08: qty 5

## 2022-12-08 MED ORDER — LACTATED RINGERS IV SOLN: INTRAVENOUS | Status: DC

## 2022-12-08 MED ORDER — DEXTROSE 50 % IV SOLN
12.5000 g | Freq: Once | INTRAVENOUS | Status: AC
  Administered 2022-12-08: 12.5 g via INTRAVENOUS

## 2022-12-08 MED ORDER — FENTANYL CITRATE (PF) 250 MCG/5ML IJ SOLN
INTRAMUSCULAR | Status: DC | PRN
  Administered 2022-12-08 (×2): 25 ug via INTRAVENOUS
  Administered 2022-12-08: 50 ug via INTRAVENOUS

## 2022-12-08 MED ORDER — ONDANSETRON HCL 4 MG/2ML IJ SOLN
INTRAMUSCULAR | Status: AC
  Filled 2022-12-08: qty 2

## 2022-12-08 MED ORDER — BUPIVACAINE HCL (PF) 0.5 % IJ SOLN
INTRAMUSCULAR | Status: AC
  Filled 2022-12-08: qty 30

## 2022-12-08 MED ORDER — KETAMINE HCL 50 MG/5ML IJ SOSY
PREFILLED_SYRINGE | INTRAMUSCULAR | Status: AC
  Filled 2022-12-08: qty 5

## 2022-12-08 MED ORDER — CHLORHEXIDINE GLUCONATE CLOTH 2 % EX PADS
6.0000 | MEDICATED_PAD | Freq: Once | CUTANEOUS | Status: DC
  Administered 2022-12-08: 6 via TOPICAL

## 2022-12-08 MED ORDER — LIDOCAINE HCL 2 % IJ SOLN
INTRAMUSCULAR | Status: AC
  Filled 2022-12-08: qty 20

## 2022-12-08 MED ORDER — SODIUM CHLORIDE 0.9 % IV SOLN: INTRAVENOUS | Status: DC

## 2022-12-08 SURGICAL SUPPLY — 61 items
APL PRP STRL LF DISP 70% ISPRP (MISCELLANEOUS) ×1
BAG COUNTER SPONGE SURGICOUNT (BAG) ×2 IMPLANT
BAG SPNG CNTER NS LX DISP (BAG)
BLADE LONG MED 31X9 (MISCELLANEOUS) IMPLANT
BLADE SAW SGTL 83.5X18.5 (BLADE) IMPLANT
BNDG CMPR 5X6 CHSV STRCH STRL (GAUZE/BANDAGES/DRESSINGS)
BNDG CMPR 9X4 STRL LF SNTH (GAUZE/BANDAGES/DRESSINGS)
BNDG CMPR STD VLCR NS LF 5.8X4 (GAUZE/BANDAGES/DRESSINGS) ×1
BNDG COHESIVE 6X5 TAN ST LF (GAUZE/BANDAGES/DRESSINGS) IMPLANT
BNDG ELASTIC 4X5.8 VLCR NS LF (GAUZE/BANDAGES/DRESSINGS) IMPLANT
BNDG ELASTIC 4X5.8 VLCR STR LF (GAUZE/BANDAGES/DRESSINGS) IMPLANT
BNDG ESMARK 4X9 LF (GAUZE/BANDAGES/DRESSINGS) IMPLANT
BNDG GAUZE DERMACEA FLUFF 4 (GAUZE/BANDAGES/DRESSINGS) IMPLANT
BNDG GZE DERMACEA 4 6PLY (GAUZE/BANDAGES/DRESSINGS) ×1
CHLORAPREP W/TINT 26 (MISCELLANEOUS) ×2 IMPLANT
CNTNR URN SCR LID CUP LEK RST (MISCELLANEOUS) IMPLANT
CONT SPEC 4OZ STRL OR WHT (MISCELLANEOUS) ×1
COVER SURGICAL LIGHT HANDLE (MISCELLANEOUS) ×2 IMPLANT
CUFF TOURN SGL QUICK 18X4 (TOURNIQUET CUFF) IMPLANT
CUFF TOURN SGL QUICK 34 (TOURNIQUET CUFF)
CUFF TRNQT CYL 34X4.125X (TOURNIQUET CUFF) IMPLANT
DRAPE OEC MINIVIEW 54X84 (DRAPES) IMPLANT
ELECT CAUTERY BLADE 6.4 (BLADE) ×2 IMPLANT
ELECT REM PT RETURN 9FT ADLT (ELECTROSURGICAL) ×1
ELECTRODE REM PT RTRN 9FT ADLT (ELECTROSURGICAL) ×2 IMPLANT
GAUZE PACKING IODOFORM 1/2INX (GAUZE/BANDAGES/DRESSINGS) IMPLANT
GAUZE PACKING IODOFORM 1/4X15 (PACKING) IMPLANT
GAUZE PAD ABD 8X10 STRL (GAUZE/BANDAGES/DRESSINGS) IMPLANT
GAUZE SPONGE 4X4 12PLY STRL (GAUZE/BANDAGES/DRESSINGS) IMPLANT
GAUZE XEROFORM 1X8 LF (GAUZE/BANDAGES/DRESSINGS) IMPLANT
GLOVE BIO SURGEON STRL SZ7.5 (GLOVE) ×2 IMPLANT
GLOVE BIOGEL PI IND STRL 7.5 (GLOVE) ×2 IMPLANT
GOWN STRL REUS W/ TWL LRG LVL3 (GOWN DISPOSABLE) ×4 IMPLANT
GOWN STRL REUS W/TWL LRG LVL3 (GOWN DISPOSABLE) ×2
HANDPIECE INTERPULSE COAX TIP (DISPOSABLE)
KIT BASIN OR (CUSTOM PROCEDURE TRAY) ×2 IMPLANT
KIT TURNOVER KIT B (KITS) ×2 IMPLANT
MANIFOLD NEPTUNE II (INSTRUMENTS) ×2 IMPLANT
NDL HYPO 25GX1X1/2 BEV (NEEDLE) ×2 IMPLANT
NEEDLE HYPO 25GX1X1/2 BEV (NEEDLE) ×1 IMPLANT
NS IRRIG 1000ML POUR BTL (IV SOLUTION) ×2 IMPLANT
PACK ORTHO EXTREMITY (CUSTOM PROCEDURE TRAY) ×2 IMPLANT
PAD ARMBOARD 7.5X6 YLW CONV (MISCELLANEOUS) ×4 IMPLANT
PAD CAST 4YDX4 CTTN HI CHSV (CAST SUPPLIES) IMPLANT
PADDING CAST COTTON 4X4 STRL (CAST SUPPLIES)
PADDING CAST COTTON 6X4 STRL (CAST SUPPLIES) IMPLANT
SET CYSTO W/LG BORE CLAMP LF (SET/KITS/TRAYS/PACK) IMPLANT
SET HNDPC FAN SPRY TIP SCT (DISPOSABLE) IMPLANT
SOL PREP POV-IOD 4OZ 10% (MISCELLANEOUS) ×2 IMPLANT
SOL SCRUB PVP POV-IOD 4OZ 7.5% (MISCELLANEOUS) ×1
SOLUTION SCRB POV-IOD 4OZ 7.5% (MISCELLANEOUS) ×2 IMPLANT
STAPLER VISISTAT 35W (STAPLE) IMPLANT
SUT ETHILON 3 0 FSL (SUTURE) IMPLANT
SUT PROLENE 3 0 PS 2 (SUTURE) IMPLANT
SWAB COLLECTION DEVICE MRSA (MISCELLANEOUS) ×2 IMPLANT
SWAB CULTURE ESWAB REG 1ML (MISCELLANEOUS) ×2 IMPLANT
SYR CONTROL 10ML LL (SYRINGE) ×2 IMPLANT
TOWEL GREEN STERILE (TOWEL DISPOSABLE) ×2 IMPLANT
TOWEL GREEN STERILE FF (TOWEL DISPOSABLE) ×2 IMPLANT
TUBE CONNECTING 12X1/4 (SUCTIONS) ×2 IMPLANT
YANKAUER SUCT BULB TIP NO VENT (SUCTIONS) ×2 IMPLANT

## 2022-12-08 NOTE — Progress Notes (Signed)
PT Cancellation Note  Patient Details Name: [name] MRN: [id number] DOB: 1975-07-14   Cancelled Treatment:    Reason Eval/Treat Not Completed: Patient at procedure or test/unavailable  Patient just left ICU to be transported to his new room on progressive unit.   [name]    Jerolyn Center, PT Acute Rehabilitation Services  Office [phone]   Zena Amos 12/08/2022, 3:20 PM

## 2022-12-08 NOTE — Brief Op Note (Signed)
12/06/2022 - 12/08/2022  4:41 PM  PATIENT:  [name]  47 y.o. male  PRE-OPERATIVE DIAGNOSIS:  Right Foot Infection  POST-OPERATIVE DIAGNOSIS:  * No post-op diagnosis entered *  PROCEDURE:  Procedure(s): IRRIGATION AND DEBRIDEMENT RIGHT FOOT (Right)  SURGEON:  Surgeon(s) and Role:    * Louann Sjogren, DPM - Primary  PHYSICIAN ASSISTANT:   ASSISTANTS: none   ANESTHESIA:   MAC, local block   EBL: 40 cc   BLOOD ADMINISTERED:none  DRAINS: none   LOCAL MEDICATIONS USED:  MARCAINE   , LIDOCAINE , and Amount: 20 ml  SPECIMEN:  Source of Specimen:  Right foot wound purulence for culture. Culture of right first metatarsal for pathology and culture.   DISPOSITION OF SPECIMEN:  Culture for wound swab and first metatarsal bone biopsy. Portion of first metatarsal bone on right for pathology.   COUNTS:  YES  TOURNIQUET:  * No tourniquets in log *  DICTATION: .Note written in EPIC  PLAN OF CARE: Admit to inpatient   PATIENT DISPOSITION:  PACU - hemodynamically stable.   Delay start of Pharmacological VTE agent (>24hrs) due to surgical blood loss or risk of bleeding: no

## 2022-12-08 NOTE — H&P (View-Only) (Signed)
Subjective:  Patient ID: [name], male    DOB: 05/21/1976,  MRN: [id number]  Patient seen this afternoon for right foot infection. Relates not too much improvement in his foot today and still having a lot of pain.   Past Medical History:  Diagnosis Date   Acute respiratory failure with hypoxia (HCC) 11/30/2018   Anemia    ESRD   ESRD on hemodialysis (HCC) 06/07/2011   TTS [facility]. Started HD in 2006, got transplant in 2014 lasted until Dec 2019 then went back on HD.  Has L thigh AVG as of Jun 2020.  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    History of blood transfusion    Hypertension    03/19/22- has not had high blood pressure in 5 years.   Morbid obesity (HCC)    Paroxysmal atrial fibrillation (HCC)    Pre-diabetes    no meds   Sepsis (HCC) 11/30/2018   Sleep apnea    lost weight, does not use CPAP     Past Surgical History:  Procedure Laterality Date   ARTERIOVENOUS GRAFT PLACEMENT  08/09/2010   Left Thigh Graft by Dr. Chris Dickson   AV FISTULA PLACEMENT Right 05/18/2018   Procedure: INSERTION OF ARTERIOVENOUS (AV) GORE-TEX GRAFT Left THIGH;  Surgeon: Cain, Brandon Christopher, MD;  Location: [facility];  Service: Vascular;  Laterality: Right;   AV FISTULA PLACEMENT Right 02/15/2021   Procedure: INSERTION OF ARTERIOVENOUS (AV) GORE-TEX LOOP GRAFT RIGHT THIGH;  Surgeon: Cain, Brandon Christopher, MD;  Location: [facility];  Service: Vascular;  Laterality: Right;   BIOPSY  05/20/2022   Procedure: BIOPSY;  Surgeon: Cirigliano, Vito V, DO;  Location: [facility];  Service: Gastroenterology;;   CAPD INSERTION N/A 06/13/2022   Procedure: LAPAROSCOPIC INSERTION CONTINUOUS AMBULATORY PERITONEAL DIALYSIS  (CAPD) CATHETER;  Surgeon: Cain, Brandon Christopher, MD;  Location: [facility];  Service: Vascular;  Laterality: N/A;   CAPD REMOVAL  05/08/2011   Procedure: CONTINUOUS AMBULATORY PERITONEAL DIALYSIS  (CAPD) CATHETER REMOVAL;  Surgeon: William J Weatherly, MD;  Location: [facility];  Service: General;  Laterality: N/A;  Removal of CAPD catheter, Dr. requests to go after 100   CAPD REMOVAL N/A 08/08/2022   Procedure: REMOVAL CONTINUOUS AMBULATORY PERITONEAL DIALYSIS  (CAPD) CATHETER;  Surgeon: Cain, Brandon Christopher, MD;  [facility];  Service: Vascular;  Laterality: N/A;   COLONOSCOPY WITH PROPOFOL N/A 05/20/2022   Procedure: COLONOSCOPY WITH PROPOFOL;  Surgeon: Cirigliano, Vito V, DO;  [facility]: [facility];  Service: Gastroenterology;  Laterality: N/A;   INSERTION OF DIALYSIS CATHETER  10/05/2010   Right Femoral Cath insertion by Dr. Brian Chen.  Pt ahas had several caths inserted.   INSERTION OF DIALYSIS CATHETER Right 02/15/2021   Procedure: Attempted INSERTION OF RIGHT and Left Internal Jugular DIALYSIS CATHETER, Insertion of Left Femoral Vein Dialysis Catheter;  Surgeon: Cain, Brandon Christopher, MD;  [facility];  Service: Vascular;  Laterality: Right;   INSERTION OF DIALYSIS CATHETER Right 04/26/2021   Procedure: INSERTION OF TUNNELED 55cm PALIDROME PRECISION CHRONIC DIALYSIS CATHETER;  Surgeon: Hawken, Thomas N, MD;  [facility];  Service: Vascular;  Laterality: Right;   INSERTION OF DIALYSIS CATHETER Left 12/26/2021   Procedure: INSERTION OF TUNNELED  DIALYSIS CATHETER LEFT FEMORAL ARTERY;  Surgeon: Cain, Brandon Christopher, MD;  [facility];  Service: Vascular;  Laterality: Left;   INSERTION OF DIALYSIS CATHETER Left 03/06/2022   Procedure: INSERTION OF DIALYSIS CATHETER;  Surgeon: Cain, Brandon Christopher, MD;  [facility];  Service: Vascular;  Laterality: Left;   IR FLUORO GUIDE CV LINE LEFT  04/23/2022   IR FLUORO GUIDE CV LINE RIGHT  05/11/2018   IR FLUORO GUIDE CV LINE RIGHT  07/03/2021   IR FLUORO GUIDE CV LINE RIGHT  07/16/2021   IR PTA ADDL CENTRAL DIALYSIS SEG THRU DIALY CIRCUIT RIGHT Right 07/03/2021   IR US GUIDE VASC ACCESS LEFT  04/23/2022   IR US GUIDE VASC ACCESS RIGHT  05/11/2018   IR VENO/EXT/UNI LEFT  04/23/2022   IR  VENOCAVAGRAM IVC  07/16/2021   KIDNEY TRANSPLANT  2014   KNEE ARTHROSCOPY Left    LAPAROSCOPIC LYSIS OF ADHESIONS N/A 06/13/2022   Procedure: LAPAROSCOPIC LYSIS OF ADHESIONS;  Surgeon: Cain, Brandon Christopher, MD;  [facility];  Service: Vascular;  Laterality: N/A;   POLYPECTOMY  05/20/2022   Procedure: POLYPECTOMY;  Surgeon: Cirigliano, Vito V, DO;  [facility];  Service: Gastroenterology;;   THROMBECTOMY W/ EMBOLECTOMY Right 04/26/2021   Procedure: REMOVAL OF RIGHT THIGH ARTERIOVENOUS GORE-TEX GRAFT AND REPAIR OF RIGHT COMMON FEMORAL ARTERY;  Surgeon: Hawken, Thomas N, MD;  [facility];  Service: Vascular;  Laterality: Right;   UPPER EXTREMITY ANGIOGRAPHY Bilateral 05/13/2018   Procedure: UPPER EXTREMITY ANGIOGRAPHY - bilarteral;  Surgeon: Clark, Christopher J, MD;  [facility];  Service: Cardiovascular;  Laterality: Bilateral;       Latest Ref Rng & Units 12/07/2022    6:46 AM 12/06/2022   10:53 AM 06/13/2022    7:44 AM  CBC  WBC 4.0 - 10.5 K/uL 16.1  14.4    Hemoglobin 13.0 - 17.0 g/dL 10.5  12.6  14.6   Hematocrit 39.0 - 52.0 % 32.9  39.8  43.0   Platelets 150 - 400 K/uL 182  189         Latest Ref Rng & Units 12/08/2022   12:49 AM 12/07/2022    6:46 AM 12/06/2022   10:53 AM  BMP  Glucose 70 - 99 mg/dL 99  125  126   BUN 6 - 20 mg/dL 61  49  34   Creatinine 0.61 - 1.24 mg/dL 15.72  14.47  12.51   Sodium 135 - 145 mmol/L 130  132  133   Potassium 3.5 - 5.1 mmol/L 5.5  4.9  5.0   Chloride 98 - 111 mmol/L 92  92  91   CO2 22 - 32 mmol/L 20  23  23   Calcium 8.9 - 10.3 mg/dL 9.2  9.3  9.0      Objective:   Vitals:   12/08/22 0845 12/08/22 1127  BP: (!) 65/45   Pulse: 80   Resp: 15   Temp:  99.4 F (37.4 C)  SpO2: 97%     General:AA&O x 3. Normal mood and affect   Vascular: DP and PT pulses 2/4 bilateral. Brisk capillary refill to all digits. Pedal hair present   Neruological. Epicritic sensation grossly intact.   Derm: The  hyperkeratotic callus and ulcer to the left great toe appear stable and noncontributory to the patient's systemic sepsis Callus with plantar foot ulcer noted to the first MTP of the right foot.  No appreciable drainage today.  It appears very dry and stable.   Heavy edema with erythema noted to the foot extending into the ankle. Pain with palpation throughout the foot.  No appreciable crepitus with palpation of the foot.  MSK: MMT 5/5 in dorsiflexion, plantar flexion, inversion and eversion. Normal joint ROM   without pain or crepitus.    Vascular:  VAS US ABI W/WO TBI 07/25/2022  ABI Findings:  +---------+------------------+-----+---------+--------+  Right   Rt Pressure (mmHg)IndexWaveform Comment   +---------+------------------+-----+---------+--------+  Brachial 94                                        +---------+------------------+-----+---------+--------+  PTA     111               1.13 triphasic          +---------+------------------+-----+---------+--------+  DP      103               1.05 triphasic          +---------+------------------+-----+---------+--------+  Great Toe103               1.05                    +---------+------------------+-----+---------+--------+   +---------+------------------+-----+---------------------------+-------+  Left    Lt Pressure (mmHg)IndexWaveform                   Comment  +---------+------------------+-----+---------------------------+-------+  Brachial 98                                                         +---------+------------------+-----+---------------------------+-------+  PTA     112               1.14 triphasic                           +---------+------------------+-----+---------------------------+-------+  DP      99                1.01 triphasic                           +---------+------------------+-----+---------------------------+-------+  Great Toe                        2nd toe waveform is present         +---------+------------------+-----+---------------------------+-------+   +-------+-----------+--------------------------------+------------+--------  ----+  ABI/TBIToday's ABIToday's TBI                     Previous  ABIPrevious TBI  +-------+-----------+--------------------------------+------------+--------  ----+  Right 1.13       1.05                            1.24        0.66           +-------+-----------+--------------------------------+------------+--------  ----+  Left  1.14       great toe ulcer/2nd toe         1.16        1.16                             waveforms present.                                         +-------+-----------+--------------------------------+------------+--------  ----+    Previous ABI at [facility] on 12/28/20.  Summary:  Right: Resting right ankle-brachial index is within normal range. The right toe-brachial index is normal.  Left: Resting left ankle-brachial index is within normal range. Great toe ulcer. Second toe waveform is present   DG FOOT COMPLETE RIGHT 12/06/2022 FINDINGS: No fracture or dislocation. Normal mineralization. Mild hallux valgus deformity. Small calcaneal spurs. No subcutaneous gas or radiodense foreign body.   IMPRESSION: 1. No acute findings. 2. Mild hallux valgus.   MR FOOT RIGHT WO CONTRAST 12/06/2022 IMPRESSION: 1. Small plantar skin ulcer at the level of the great toe metatarsophalangeal joint with associated soft tissue edema and swelling about the medial greater than lateral aspects of the great toe and medial greater than lateral aspects of the midfoot consistent with cellulitis. There are regions of coalescing fluid medial to the great toe metatarsophalangeal joints and dorsal medial to the length of the proximal phalanx of the great toe suspicious for abscesses. 2. No cortical erosion or marrow edema is seen to indicate MRI evidence of acute  osteomyelitis.  Assessment & Plan:  Patient was evaluated and treated and all questions answered.  DX: Right foot abscess and diabetic foot infection Antibiotics: Continue IV cefepime  Discussed with patient diagnosis and treatment options.  Given no improvement will continue with plan to take to the OR this afternoon/evening for irrigation and debridement of right foot infection. Continue NPO Patient in agreement with plan and all questions answered.   Landon Truax, DPM  Accessible via secure chat for questions or concerns.  

## 2022-12-08 NOTE — Op Note (Signed)
   OPERATIVE REPORT Patient name: [name] MRN: [id number] DOB: 08-Mar-1976  DOS:  12/08/22  Preop Dx: Ulcer of right foot, unspecified ulcer stage (HCC)  Sepsis without acute organ dysfunction, due to unspecified organism (HCC) Postop Dx: same  Procedure:  1. Right foot incision and drainage of abscess  Surgeon: Louann Sjogren, DPM  Anesthesia: 50-50 mixture of 2% lidocaine plain with 0.5% Marcaine plain totaling 20 infiltrated in the patient's right lower extremity  Hemostasis: No TQ necessary   EBL: 40 mL Materials: N/a Injectables: as above Pathology: culture swab of purulent tissue  Condition: The patient tolerated the procedure and anesthesia well. No complications noted or reported   Justification for procedure: The patient is a 47 y.o.  male who presents today for surgical treatment of right foot infection. All conservative modalities of been unsuccessful in providing any sort of satisfactory alleviation of symptoms with the patient. The patient was told benefits as well as possible side effects of the surgery. The patient consented for surgical correction. The patient consent form was reviewed. All patient questions were answered. No guarantees were expressed or implied. The patient and the surgeon both signed the patient consent form with the witness present and placed in the patient's chart.   Procedure in Detail: The patient was brought to the operating room, placed in the operating table in the supine position at which time an aseptic scrub and drape were performed about the patient's respective lower extremity after anesthesia was induced as described above. Attention was then directed to the surgical area where procedure number one commenced.  Procedure #1:   Attention was then directed to the dorsal right great toe. A 6 cm incision was made over the dorsomedial MPJ extending to the IPJ.  About 10 cc of purulence was expressed. Using hemostats purulence was  expressed tracking distally to IPJ of the hallux and proximally to the midshaft of the first metatarsal and plantarly to the first metatarsal wound.  Purulence appeared to remain in the subcutaneous tissue and did not track deep to fascia and periosteum. The wound was irrigated with 3L normal saline copiously. The incision was then deepened down to bone bluntly . A bone biopsy was then preformed with a rongeur. The bone extracted was then sent for pathology and culture. The area was irrigated copiously again  The skin was then closed with 3-0 nylon  with the central aspect of the incision was left open and packed with 1/2 inch iodoform packing.    Dry sterile compressive dressings were then applied to all previously mentioned incision sites and wounds about the patient's lower extremity. The patient was then transferred from the operating room to the recovery room having tolerated the procedure and anesthesia well. All vital signs are stable. After a brief stay in the recovery room the patient was readmitted to the floor.    Disposition:  Patient to be transferred back to the floor. Will follow cultures and continue IV antitbiotics.    Louann Sjogren, DPM [facility]  Dr. Louann Sjogren, DPM    [address] [place], [place] [address]                Office [phone]  Fax [phone]

## 2022-12-08 NOTE — Progress Notes (Signed)
   12/08/22 2342  BiPAP/CPAP/SIPAP  $ Non-Invasive Home Ventilator  Initial  $ Face Mask Large  Yes  BiPAP/CPAP/SIPAP Pt Type Adult  BiPAP/CPAP/SIPAP Resmed  Mask Type Full face mask  Mask Size Large  Respiratory Rate 16 breaths/min  FiO2 (%) 21 %  Patient Home Equipment No  Auto Titrate Yes (7-18)  CPAP/SIPAP surface wiped down Yes  BiPAP/CPAP /SiPAP Vitals  Pulse Rate (!) 112  Resp 17  SpO2 96 %  Bilateral Breath Sounds Clear  MEWS Score/Color  MEWS Score 2  MEWS Score Color Yellow

## 2022-12-08 NOTE — Progress Notes (Addendum)
Pt CBG=65. Called Pre-op to make aware as pt in route to OR. RN verbalized understanding and will address. Pt arrived from 80M without consent signed or preop orders released. Pre-op checklist started and consent signed as transporter arrived to take pt to OR. Thomas Hoff, RN

## 2022-12-08 NOTE — Hospital Course (Addendum)
47 year old male with past medical history of  ESRD on TTS iHD via R femoral TDC, on Eliquis for recurrent catheter/ clotted grafts and PAF, HFpEF/ cor pulmonale, OSA not on CPAP presented to the hospital with right foot ulcer for 2 months.  1 week prior to presentation he tried to shave a callus and applied steroid cream an since then started draining with increasing pain, warmth and redness.  He also had chills without any fever.  He did go to dialysis but his blood pressure was very low and he was trying to stay off midodrine to be on transplant list.  In ER, patient was hypotensive and febrile with tachycardia.  Labs showed leukocytosis with WBC 14.4, lactic 3.2> 2.7, K 5, bicarb 23.  CXR showed low lung volumes, R  shoulder xray with borderline widening of AC joint otherwise negative for acute injury, right foot Xray without acute findings, neg for subcutaneous gas.  Patient was initially treated with vancomycin and cefepime.  Given persistent low blood pressure, PCCM consulted and was treated in the ICU.  Assessment and plan.  Severe sepsis from right foot cellulitis, possible abscess  Right foot x-ray was negative.  MRI was negative for osteomyelitis but possible right great toe abscess.  Podiatry was consulted.  Continue broad-spectrum antibiotic.  Podiatry to reassess the situation and decide.  Check ABI, pending hemoglobin A1c.  Currently on vancomycin and cefepime.   Hypotension, chronic Reports blood pressure in the 90s at home.  Suspect his BP is much better, just inaccurate readings given his PVD.  Patient had refused Aline. - SBP goal > 70, MAP > 50 for now with intact mental status On the increased dose of midodrine to 15 mg 3 times daily.  HFpEF/ cor pulmonale  2D echocardiogram shows LV ejection fraction of 55 to 60%.   OSA not on CPAP Needs a repeat sleep study as outpatient.  Patient had been refusing CPAP at nighttime.   ESRD on TTS iHD via R femoral TDC (last access option per  pt)  Nephrology on board for dialysis needs.   PAF on Eliquis  Currently normal sinus rhythm.  Continue Eliquis.  Not on any rate controlling medication.   Morbid obesity, BMI 39.5 kg/m2 Would benefit from weight loss as outpatient.

## 2022-12-08 NOTE — Anesthesia Preprocedure Evaluation (Addendum)
Anesthesia Evaluation  Patient identified by MRN, date of birth, ID band Patient awake    Reviewed: Allergy & Precautions, H&P , NPO status , Patient's Chart, lab work & pertinent test results  Airway Mallampati: I  TM Distance: >3 FB Neck ROM: Full    Dental no notable dental hx. (+) Teeth Intact, Dental Advisory Given   Pulmonary neg pulmonary ROS, sleep apnea , Patient abstained from smoking., former smoker   Pulmonary exam normal breath sounds clear to auscultation       Cardiovascular hypertension, Pt. on medications +CHF  + dysrhythmias Atrial Fibrillation  Rhythm:Regular Rate:Normal  ECHO 6/24  1. Left ventricular ejection fraction, by estimation, is 55 to 60%. The  left ventricle has normal function. The left ventricle has no regional  wall motion abnormalities. Left ventricular diastolic parameters were  normal.   2. Right ventricular systolic function was not well visualized. The right  ventricular size is normal. Tricuspid regurgitation signal is inadequate  for assessing PA pressure.   3. The mitral valve is grossly normal. No evidence of mitral valve  regurgitation. No evidence of mitral stenosis.   4. The aortic valve was not well visualized. Aortic valve regurgitation  is not visualized. No aortic stenosis is present.   5. The inferior vena cava is normal in size with greater than 50%  respiratory variability, suggesting right atrial pressure of 3 mmHg.     Neuro/Psych negative neurological ROS  negative psych ROS   GI/Hepatic negative GI ROS, Neg liver ROS,,,  Endo/Other  negative endocrine ROS  Morbid obesity  Renal/GU ESRF and DialysisRenal diseasenegative Renal ROS  negative genitourinary   Musculoskeletal negative musculoskeletal ROS (+)    Abdominal   Peds negative pediatric ROS (+)  Hematology negative hematology ROS (+) Blood dyscrasia, anemia   Anesthesia Other Findings    Reproductive/Obstetrics negative OB ROS                             Anesthesia Physical Anesthesia Plan  ASA: 3 and emergent  Anesthesia Plan: General   Post-op Pain Management: Minimal or no pain anticipated   Induction: Intravenous and Cricoid pressure planned  PONV Risk Score and Plan: 2 and Ondansetron, Dexamethasone and Treatment may vary due to age or medical condition  Airway Management Planned: Oral ETT  Additional Equipment: None  Intra-op Plan:   Post-operative Plan: Extubation in OR  Informed Consent: I have reviewed the patients History and Physical, chart, labs and discussed the procedure including the risks, benefits and alternatives for the proposed anesthesia with the patient or authorized representative who has indicated his/her understanding and acceptance.     Dental advisory given  Plan Discussed with: CRNA and Anesthesiologist  Anesthesia Plan Comments:        Anesthesia Quick Evaluation

## 2022-12-08 NOTE — Progress Notes (Signed)
Dr. Tacy Dura with anesthesia made aware of the I-Stat K+- 6.1, and repeat Glucose- 101.

## 2022-12-08 NOTE — Interval H&P Note (Signed)
History and Physical Interval Note:  12/08/2022 4:39 PM  [name]  has presented today for surgery, with the diagnosis of Right Foot Infection.  The various methods of treatment have been discussed with the patient and family. After consideration of risks, benefits and other options for treatment, the patient has consented to  Procedure(s): IRRIGATION AND DEBRIDEMENT RIGHT FOOT (Right) as a surgical intervention.  The patient's history has been reviewed, patient examined, no change in status, glucose 65 and K 6.1 will confer with anesthesia on plan but can limit sedation and do a local block.  I have reviewed the patient's chart and labs.  Questions were answered to the patient's satisfaction.     Louann Sjogren

## 2022-12-08 NOTE — Transfer of Care (Signed)
Immediate Anesthesia Transfer of Care Note  Patient: [name]  Procedure(s) Performed: IRRIGATION AND DEBRIDEMENT RIGHT FOOT (Right: Foot)  Patient Location: PACU  Anesthesia Type:MAC  Level of Consciousness: oriented and drowsy  Airway & Oxygen Therapy: Patient connected to nasal cannula oxygen  Post-op Assessment: Report given to RN, Post -op Vital signs reviewed and stable, and Patient moving all extremities  Post vital signs: stable  Last Vitals:  Vitals Value Taken Time  BP 73/53 12/08/22 1739  Temp 36.7 C 12/08/22 1735  Pulse 79 12/08/22 1743  Resp 25 12/08/22 1743  SpO2 95 % 12/08/22 1743  Vitals shown include unvalidated device data.  Last Pain:  Vitals:   12/08/22 1735  TempSrc:   PainSc: 0-No pain      Patients Stated Pain Goal: 2 (12/08/22 1611)  Complications: No notable events documented.

## 2022-12-08 NOTE — Consult Note (Signed)
WOC Nurse Consult Note: Reason for Consult: Cellulitis to right foot. Plantar foot wound near right great toe head. Began as patient tried to pare a callous on the bottom of his foot.  He has been using an antibiotic cream he was prescribed at home for the left great toe wound. Unsure of name.  He wears compression garments at home.  Left great toe wound, nearly healed, scant drainage from open linear segment of wound edge.  Wound type: neuropathic Pressure Injury POA: NA Measurement: Right plantar foot 1 cm round nonintact lesion with ruddy red wound bed LEft medial great toe with chronic nonhealing wound that remain open in one small area. (Appears as a slit at this time)  Scant drainage from this area.  Wound bed: see above Drainage (amount, consistency, odor) serosanguinous  no odor Periwound: dry skin, chronic edema wears compression at home Dressing procedure/placement/frequency: cleanse bilateral legs with soap and water and pat dry. Apply aquacel to open wounds on right plantar foot at great toe and left great toe.  [facility] # [id number])   cover with gauze.  ORtho tech to wrap in Unna boots for compression.  CHange twice weekly.  Patient understands we will remove unna boots at discharge and he will resume his home compression garment.  Will not follow at this time.  Please re-consult if needed.  Mike Gip MSN, RN, FNP-BC CWON Wound, Ostomy, Continence Nurse [facility] [phone] Pager [phone]

## 2022-12-08 NOTE — Progress Notes (Addendum)
[name]  Assessment/ Plan: Pt is a 47 y.o. yo male with a past medical history significant for ESRD on HD, chronic hypotension, A-fib, CHF admitted  with sepsis and a right foot infection.  OP HD: TTS HD  4.5h   500/ 500  140kg   2/2 bath  TDC  Hep 5000+ - hectorol 6 mcg IV three times per week - parsabiv 7.5 mg IV three times per week - no esa  # Sepsis due to right foot infection/possible abscess: Currently on broad-spectrum antibiotics, vancomycin and cefepime.  It seems like the podiatrist was consulted.  # ESRD TTS: Plan for regular dialysis tomorrow.  UF as tolerated.  # Chronic hypotension: Per patient he has chronic hypotension with SBP running around 70-80s, asymptomatic.  Currently on midodrine.  # Anemia of CKD: Hemoglobin at goal.  Continue to monitor.  # CKD-MBD: Continue Auryxia, Hectorol.  Monitor lab.  # Mild hyperkalemia: Order a dose of Lokelma.  HD tomorrow.  Subjective: Seen and examined at bedside.  He denies nausea, vomiting, chest pain, shortness of breath.  No headache, dizziness.  He states that his blood pressure always runs low without any symptoms. Objective Vital signs in last 24 hours: Vitals:   12/08/22 0812 12/08/22 0815 12/08/22 0830 12/08/22 0845  BP:  (!) 69/45 (!) 76/48 (!) 65/45  Pulse:  81 83 80  Resp:  17 12 15   Temp: 97.7 F (36.5 C)     TempSrc: Oral     SpO2:  96% 92% 97%  Weight:      Height:       Weight change: 5.8 kg  Intake/Output Summary (Last 24 hours) at 12/08/2022 1124 Last data filed at 12/07/2022 2200 Gross per 24 hour  Intake 706.19 ml  Output --  Net 706.19 ml       Labs: RENAL PANEL Recent Labs  Lab 12/06/22 1053 12/07/22 0646 12/08/22 0049  NA 133* 132* 130*  K 5.0 4.9 5.5*  CL 91* 92* 92*  CO2 23 23 20*  GLUCOSE 126* 125* 99  BUN 34* 49* 61*  CREATININE 12.51* 14.47* 15.72*  CALCIUM 9.0 9.3 9.2  MG  --  2.2  --   PHOS  --  6.3*  --    ALBUMIN 3.8  --   --     Liver Function Tests: Recent Labs  Lab 12/06/22 1053  AST 26  ALT 31  ALKPHOS 58  BILITOT 1.1  PROT 8.5*  ALBUMIN 3.8   No results for input(s): "LIPASE", "AMYLASE" in the last 168 hours. No results for input(s): "AMMONIA" in the last 168 hours. CBC: Recent Labs    03/21/22 0619 05/20/22 0759 06/13/22 0744 12/06/22 1053 12/07/22 0646  HGB 15.6 15.0 14.6 12.6* 10.5*  MCV  --   --   --  92.8 94.5    Cardiac Enzymes: No results for input(s): "CKTOTAL", "CKMB", "CKMBINDEX", "TROPONINI" in the last 168 hours. CBG: Recent Labs  Lab 12/07/22 1513 12/07/22 2003 12/07/22 2338 12/08/22 0327 12/08/22 0810  GLUCAP 79 128* 90 89 93    Iron Studies: No results for input(s): "IRON", "TIBC", "TRANSFERRIN", "FERRITIN" in the last 72 hours. Studies/Results: ECHOCARDIOGRAM COMPLETE  Result Date: 12/07/2022    ECHOCARDIOGRAM REPORT   Patient Name:   [name] Date of Exam: 12/07/2022 Medical Rec #:  [id number]         Height:       74.0 in Accession #:  [id number]        Weight:       312.6 lb Date of Birth:  April 21, 1976         BSA:          2.628 m Patient Age:    47 years          BP:           82/52 mmHg Patient Gender: M                 HR:           90 bpm. Exam Location:  Inpatient Procedure: 2D Echo, Color Doppler and Cardiac Doppler Indications:    Fever  History:        Patient has prior history of Echocardiogram examinations, most                 recent 03/21/2020. CHF, Arrythmias:Atrial Fibrillation; Risk                 Factors:Hypertension, Dyslipidemia and Sleep Apnea.  Sonographer:    Irving Burton Senior RDCS Referring Phys: [id number] Josephine Igo  Sonographer Comments: Technically difficult due to patient body habitus. IMPRESSIONS  1. Left ventricular ejection fraction, by estimation, is 55 to 60%. The left ventricle has normal function. The left ventricle has no regional wall motion abnormalities. Left ventricular diastolic parameters were normal.   2. Right ventricular systolic function was not well visualized. The right ventricular size is normal. Tricuspid regurgitation signal is inadequate for assessing PA pressure.  3. The mitral valve is grossly normal. No evidence of mitral valve regurgitation. No evidence of mitral stenosis.  4. The aortic valve was not well visualized. Aortic valve regurgitation is not visualized. No aortic stenosis is present.  5. The inferior vena cava is normal in size with greater than 50% respiratory variability, suggesting right atrial pressure of 3 mmHg. FINDINGS  Left Ventricle: Left ventricular ejection fraction, by estimation, is 55 to 60%. The left ventricle has normal function. The left ventricle has no regional wall motion abnormalities. The left ventricular internal cavity size was normal in size. There is  no left ventricular hypertrophy. Left ventricular diastolic parameters were normal. Right Ventricle: The right ventricular size is normal. No increase in right ventricular wall thickness. Right ventricular systolic function was not well visualized. Tricuspid regurgitation signal is inadequate for assessing PA pressure. Left Atrium: Left atrial size was normal in size. Right Atrium: Right atrial size was normal in size. Pericardium: There is no evidence of pericardial effusion. Mitral Valve: The mitral valve is grossly normal. No evidence of mitral valve regurgitation. No evidence of mitral valve stenosis. Tricuspid Valve: The tricuspid valve is grossly normal. Tricuspid valve regurgitation is not demonstrated. No evidence of tricuspid stenosis. Aortic Valve: The aortic valve was not well visualized. Aortic valve regurgitation is not visualized. No aortic stenosis is present. Pulmonic Valve: The pulmonic valve was not well visualized. Pulmonic valve regurgitation is not visualized. No evidence of pulmonic stenosis. Aorta: The aortic root and ascending aorta are structurally normal, with no evidence of dilitation.  Venous: The inferior vena cava is normal in size with greater than 50% respiratory variability, suggesting right atrial pressure of 3 mmHg. IAS/Shunts: No atrial level shunt detected by color flow Doppler.  LEFT VENTRICLE PLAX 2D LVIDd:         3.70 cm   Diastology LVIDs:         2.70 cm   LV e' medial:  7.29 cm/s LV PW:         1.00 cm   LV E/e' medial:  9.6 LV IVS:        0.90 cm   LV e' lateral:   9.36 cm/s LVOT diam:     2.30 cm   LV E/e' lateral: 7.5 LV SV:         71 LV SV Index:   27 LVOT Area:     4.15 cm  RIGHT VENTRICLE RV S prime:     8.70 cm/s TAPSE (M-mode): 2.0 cm LEFT ATRIUM             Index        RIGHT ATRIUM           Index LA diam:        3.00 cm 1.14 cm/m   RA Area:     13.40 cm LA Vol (A2C):   33.5 ml 12.75 ml/m  RA Volume:   29.10 ml  11.07 ml/m LA Vol (A4C):   41.2 ml 15.68 ml/m LA Biplane Vol: 38.6 ml 14.69 ml/m  AORTIC VALVE LVOT Vmax:   99.50 cm/s LVOT Vmean:  70.700 cm/s LVOT VTI:    0.170 m  AORTA Ao Root diam: 3.20 cm Ao Asc diam:  3.10 cm MITRAL VALVE MV Area (PHT): 3.87 cm    SHUNTS MV Decel Time: 196 msec    Systemic VTI:  0.17 m MV E velocity: 70.00 cm/s  Systemic Diam: 2.30 cm MV A velocity: 62.90 cm/s MV E/A ratio:  1.11 Vishnu Priya Mallipeddi Electronically signed by Winfield Rast Mallipeddi Signature Date/Time: 12/07/2022/4:06:07 PM    Final    DG Chest 1 View  Result Date: 12/07/2022 CLINICAL DATA:  47 year old male history of sepsis. EXAM: CHEST  1 VIEW COMPARISON:  Chest x-ray 12/06/2022. FINDINGS: Lung volumes are normal. No consolidative airspace disease. No pleural effusions. No pneumothorax. No pulmonary nodule or mass noted. Pulmonary vasculature and the cardiomediastinal silhouette are within normal limits. Atherosclerosis in the thoracic aorta. IMPRESSION: 1.  No radiographic evidence of acute cardiopulmonary disease. 2. Aortic atherosclerosis. Electronically Signed   By: Trudie Reed M.D.   On: 12/07/2022 05:26   MR FOOT RIGHT WO CONTRAST  Result  Date: 12/06/2022 CLINICAL DATA:  End-stage renal disease, congestive heart failure with worsening right foot pain. Ulcer drainage. Fever. Patient noticed small ulcer on plantar right foot 2 months ago with intermittent 10 purulent discharge. 1 week ago patient noticed a callus develop covering the ulcer and use pain nail cutter to remove the callus with copious amounts of pus coming out. 2 days ago patient started to feel severe pain of right foot extending to mid shin. Elevated white blood cell count. Evaluate for osteomyelitis. EXAM: MRI OF THE RIGHT FOREFOOT WITHOUT CONTRAST TECHNIQUE: Multiplanar, multisequence MR imaging of the right foot radiographs 12/06/2022 was performed. No intravenous contrast was administered. COMPARISON:  None Available. FINDINGS: Bones/Joint/Cartilage Mild to moderate hallux valgus. Moderate great toe metatarsophalangeal cartilage thinning and peripheral osteophytosis. Mild great toe metatarsophalangeal joint effusion. No cortical erosion or marrow edema is seen to indicate MRI evidence of acute osteomyelitis. Ligaments The great toe metatarsophalangeal and interphalangeal collateral ligaments appear intact. The Lisfranc ligament complex is intact. Muscles and Tendons There is diffuse edema seen throughout the plantar foot musculature. Soft tissues There is mild irregularity of the plantar skin at the level of the great toe metatarsophalangeal joint (sagittal series 9, image 10 and axial series 6, image 35) likely representing  a small ulcer/wound. Deep to this, there is decreased T1 increased T2 signal edema throughout the plantar great toe subcutaneous fat that also extends throughout the subcutaneous fat medial to the great toe and slightly superomedial to the great toe. There are tiny possibly communicating regions of coalescing fluid seen measuring up to 4 mm in craniocaudal thickness within the subcutaneous fat bordering the dorsal medial base of the proximal phalanx of the great  toe (axial series 6, image 33 and coronal series 7, image 19) and measuring up to 5 mm in craniocaudal thickness dorsal medial to the majority of the shaft of the proximal phalanx of the great toe (coronal series 7 images 21 through 25 and axial series 6 images 34 through 41). These are suspicious for small abscesses. There is diffuse moderate medial greater than lateral great toe and medial greater than lateral dorsal midfoot subcutaneous fat edema and swelling. IMPRESSION: 1. Small plantar skin ulcer at the level of the great toe metatarsophalangeal joint with associated soft tissue edema and swelling about the medial greater than lateral aspects of the great toe and medial greater than lateral aspects of the midfoot consistent with cellulitis. There are regions of coalescing fluid medial to the great toe metatarsophalangeal joints and dorsal medial to the length of the proximal phalanx of the great toe suspicious for abscesses. 2. No cortical erosion or marrow edema is seen to indicate MRI evidence of acute osteomyelitis. Electronically Signed   By: Neita Garnet M.D.   On: 12/06/2022 20:14    Medications: Infusions:  sodium chloride     ceFEPime (MAXIPIME) IV Stopped (12/07/22 1255)   [START ON 12/09/2022] vancomycin      Scheduled Medications:  apixaban  5 mg Oral BID   atorvastatin  40 mg Oral Daily   Chlorhexidine Gluconate Cloth  6 each Topical Daily   [START ON 12/09/2022] doxercalciferol  6 mcg Intravenous Q T,Th,Sa-HD   ferric citrate  630 mg Oral TID WC   insulin aspart  0-6 Units Subcutaneous Q4H   midodrine  15 mg Oral Q8H   mupirocin ointment  1 Application Nasal BID    have reviewed scheduled and prn medications.  Physical Exam: General:NAD, comfortable Heart:RRR, s1s2 nl Lungs:clear b/l, no crackle Abdomen:soft, Non-tender, non-distended Extremities:No edema Dialysis Access: Left femoral TDC in place.  Yarithza Mink Elpidio Anis Irean Kendricks 12/08/2022,11:24 AM  LOS: 2 days

## 2022-12-08 NOTE — Progress Notes (Addendum)
PROGRESS NOTE    [name]  ZOX:[id number] DOB: 03-Jul-1975 DOA: 12/06/2022 PCP: Robert Mast, MD    Brief Narrative:   47 year old male with past medical history of  ESRD on TTS iHD via R femoral TDC, on Eliquis for recurrent catheter/ clotted grafts and PAF, HFpEF/ cor pulmonale, OSA not on CPAP presented to the hospital with right foot ulcer for 2 months.  1 week prior to presentation he tried to shave a callus and applied steroid cream an since then started draining with increasing pain, warmth and redness.  He also had chills without any fever.  He did go to dialysis but his blood pressure was very low and he was trying to stay off midodrine to be on transplant list.  In ER, patient was hypotensive and febrile with tachycardia.  Labs showed leukocytosis with WBC 14.4, lactic 3.2> 2.7, K 5, bicarb 23.  CXR showed low lung volumes, Right  shoulder xray with borderline widening of AC joint otherwise negative for acute injury, right foot Xray without acute findings, neg for subcutaneous gas.  Patient was initially treated with vancomycin and cefepime.  Given persistent low blood pressure, PCCM consulted and was treated in the ICU.  Subsequently, patient was considered stable for transfer out of the ICU.  Assessment and plan.  Severe sepsis from right foot cellulitis, possible abscess  Right foot x-ray was negative.  MRI was negative for osteomyelitis but possible right great toe abscess.  Podiatry was consulted.  Continue broad-spectrum antibiotic.  Check ABI, pending hemoglobin A1c.  Currently on vancomycin and cefepime.Podiatry to reassess the situation and decide today.     Hypotension, chronic Reports blood pressure in the 90s at home.  Suspect his BP is much better, just inaccurate readings given his PVD.  Patient had refused Aline. - SBP goal > 70, MAP > 50 for now with intact mental status. On increased dose of midodrine to 15 mg 3 times daily.  Hyperkalemia.  Can be adjusted  with hemodialysis.  HFpEF/ cor pulmonale  2D echocardiogram shows LV ejection fraction of 55 to 60%.   OSA not on CPAP Needs a repeat sleep study as outpatient.  Patient had been refusing CPAP at nighttime.   ESRD on TTS iHD via R femoral TDC (last access option per pt)  Nephrology on board for dialysis needs.   PAF on Eliquis  Currently normal sinus rhythm.  Continue Eliquis.  Not on any rate controlling medication.   Morbid obesity, BMI 39.5 kg/m2 Would benefit from weight loss as outpatient.    DVT prophylaxis: SCDs Start: 12/06/22 1433 apixaban (ELIQUIS) tablet 5 mg   Code Status:     Code Status: Full Code  Disposition: Likely home with home health in 2 to 3 days  Status is: Inpatient  Remains inpatient appropriate because: Need for IV antibiotic, possible need for surgical intervention, hypertension   Family Communication: None at bedside  Consultants:  PCCM Podiatry Nephrology  Procedures:  Hemodialysis  Antimicrobials:  Vancomycin and cefepime  Anti-infectives (From admission, onward)    Start     Dose/Rate Route Frequency Ordered Stop   12/09/22 1200  vancomycin (VANCOREADY) IVPB 1500 mg/300 mL        1,500 mg 150 mL/hr over 120 Minutes Intravenous Every T-Th-Sa (Hemodialysis) 12/06/22 1710     12/06/22 1200  ceFEPIme (MAXIPIME) 1 g in sodium chloride 0.9 % 100 mL IVPB        1 g 200 mL/hr over 30 Minutes  Intravenous Every 24 hours 12/06/22 1157     12/06/22 1200  vancomycin (VANCOCIN) IVPB 1000 mg/200 mL premix       See Hyperspace for full Linked Orders Report.   1,000 mg 200 mL/hr over 60 Minutes Intravenous  Once 12/06/22 1157 12/06/22 1442   12/06/22 1200  vancomycin (VANCOREADY) IVPB 1500 mg/300 mL       See Hyperspace for full Linked Orders Report.   1,500 mg 150 mL/hr over 120 Minutes Intravenous  Once 12/06/22 1157 12/06/22 1650      Subjective: Today, patient was seen and examined at bedside.  Complains of foot pain and discomfort.   Denies any dizziness, lightheadedness, shortness of breath or chest pain.  Objective: Vitals:   12/08/22 0600 12/08/22 0615 12/08/22 0630 12/08/22 0812  BP: (!) 76/55 (!) 72/54 (!) 80/51   Pulse: 89 79 82   Resp: (!) 22 14 (!) 4   Temp:    97.7 F (36.5 C)  TempSrc:    Oral  SpO2: 97% 96% 98%   Weight:      Height:        Intake/Output Summary (Last 24 hours) at 12/08/2022 0852 Last data filed at 12/07/2022 2200 Gross per 24 hour  Intake 884.84 ml  Output --  Net 884.84 ml   Filed Weights   12/06/22 1551 12/07/22 0500 12/08/22 0500  Weight: (!) 141.3 kg (!) 141.8 kg (!) 145.5 kg    Physical Examination: Body mass index is 41.18 kg/m.   General: Obese built, not in obvious distress HENT:   No scleral pallor or icterus noted. Oral mucosa is moist.  Chest:  Clear breath sounds.  Diminished breath sounds bilaterally. No crackles or wheezes.  CVS: S1 &S2 heard. No murmur.  Regular rate and rhythm. Abdomen: Soft, nontender, nondistended.  Bowel sounds are heard.   Extremities: Left thigh AV graft , right femoral AV graft, hemodialysis catheter left groin.  Right foot with edema tenderness and ulceration with purulent discharge. Psych: Alert, awake and oriented, normal mood CNS:  No cranial nerve deficits.  Power equal in all extremities.   Skin: Warm and dry.  Right foot swelling edema ulceration.  Data Reviewed:   CBC: Recent Labs  Lab 12/06/22 1053 12/07/22 0646  WBC 14.4* 16.1*  NEUTROABS 11.3*  --   HGB 12.6* 10.5*  HCT 39.8 32.9*  MCV 92.8 94.5  PLT 189 182    Basic Metabolic Panel: Recent Labs  Lab 12/06/22 1053 12/07/22 0646 12/08/22 0049  NA 133* 132* 130*  K 5.0 4.9 5.5*  CL 91* 92* 92*  CO2 23 23 20*  GLUCOSE 126* 125* 99  BUN 34* 49* 61*  CREATININE 12.51* 14.47* 15.72*  CALCIUM 9.0 9.3 9.2  MG  --  2.2  --   PHOS  --  6.3*  --     Liver Function Tests: Recent Labs  Lab 12/06/22 1053  AST 26  ALT 31  ALKPHOS 58  BILITOT 1.1  PROT  8.5*  ALBUMIN 3.8     Radiology Studies: ECHOCARDIOGRAM COMPLETE  Result Date: 12/07/2022    ECHOCARDIOGRAM REPORT   Patient Name:   [name] Date of Exam: 12/07/2022 Medical Rec #:  [id number]         Height:       74.0 in Accession #:    [id number]        Weight:       312.6 lb Date of Birth:  1976-02-11  BSA:          2.628 m Patient Age:    47 years          BP:           82/52 mmHg Patient Gender: M                 HR:           90 bpm. Exam Location:  Inpatient Procedure: 2D Echo, Color Doppler and Cardiac Doppler Indications:    Fever  History:        Patient has prior history of Echocardiogram examinations, most                 recent 03/21/2020. CHF, Arrythmias:Atrial Fibrillation; Risk                 Factors:Hypertension, Dyslipidemia and Sleep Apnea.  Sonographer:    Irving Burton Senior RDCS Referring Phys: [id number] Josephine Igo  Sonographer Comments: Technically difficult due to patient body habitus. IMPRESSIONS  1. Left ventricular ejection fraction, by estimation, is 55 to 60%. The left ventricle has normal function. The left ventricle has no regional wall motion abnormalities. Left ventricular diastolic parameters were normal.  2. Right ventricular systolic function was not well visualized. The right ventricular size is normal. Tricuspid regurgitation signal is inadequate for assessing PA pressure.  3. The mitral valve is grossly normal. No evidence of mitral valve regurgitation. No evidence of mitral stenosis.  4. The aortic valve was not well visualized. Aortic valve regurgitation is not visualized. No aortic stenosis is present.  5. The inferior vena cava is normal in size with greater than 50% respiratory variability, suggesting right atrial pressure of 3 mmHg. FINDINGS  Left Ventricle: Left ventricular ejection fraction, by estimation, is 55 to 60%. The left ventricle has normal function. The left ventricle has no regional wall motion abnormalities. The left ventricular internal  cavity size was normal in size. There is  no left ventricular hypertrophy. Left ventricular diastolic parameters were normal. Right Ventricle: The right ventricular size is normal. No increase in right ventricular wall thickness. Right ventricular systolic function was not well visualized. Tricuspid regurgitation signal is inadequate for assessing PA pressure. Left Atrium: Left atrial size was normal in size. Right Atrium: Right atrial size was normal in size. Pericardium: There is no evidence of pericardial effusion. Mitral Valve: The mitral valve is grossly normal. No evidence of mitral valve regurgitation. No evidence of mitral valve stenosis. Tricuspid Valve: The tricuspid valve is grossly normal. Tricuspid valve regurgitation is not demonstrated. No evidence of tricuspid stenosis. Aortic Valve: The aortic valve was not well visualized. Aortic valve regurgitation is not visualized. No aortic stenosis is present. Pulmonic Valve: The pulmonic valve was not well visualized. Pulmonic valve regurgitation is not visualized. No evidence of pulmonic stenosis. Aorta: The aortic root and ascending aorta are structurally normal, with no evidence of dilitation. Venous: The inferior vena cava is normal in size with greater than 50% respiratory variability, suggesting right atrial pressure of 3 mmHg. IAS/Shunts: No atrial level shunt detected by color flow Doppler.  LEFT VENTRICLE PLAX 2D LVIDd:         3.70 cm   Diastology LVIDs:         2.70 cm   LV e' medial:    7.29 cm/s LV PW:         1.00 cm   LV E/e' medial:  9.6 LV IVS:  0.90 cm   LV e' lateral:   9.36 cm/s LVOT diam:     2.30 cm   LV E/e' lateral: 7.5 LV SV:         71 LV SV Index:   27 LVOT Area:     4.15 cm  RIGHT VENTRICLE RV S prime:     8.70 cm/s TAPSE (M-mode): 2.0 cm LEFT ATRIUM             Index        RIGHT ATRIUM           Index LA diam:        3.00 cm 1.14 cm/m   RA Area:     13.40 cm LA Vol (A2C):   33.5 ml 12.75 ml/m  RA Volume:   29.10 ml   11.07 ml/m LA Vol (A4C):   41.2 ml 15.68 ml/m LA Biplane Vol: 38.6 ml 14.69 ml/m  AORTIC VALVE LVOT Vmax:   99.50 cm/s LVOT Vmean:  70.700 cm/s LVOT VTI:    0.170 m  AORTA Ao Root diam: 3.20 cm Ao Asc diam:  3.10 cm MITRAL VALVE MV Area (PHT): 3.87 cm    SHUNTS MV Decel Time: 196 msec    Systemic VTI:  0.17 m MV E velocity: 70.00 cm/s  Systemic Diam: 2.30 cm MV A velocity: 62.90 cm/s MV E/A ratio:  1.11 Vishnu Priya Mallipeddi Electronically signed by Winfield Rast Mallipeddi Signature Date/Time: 12/07/2022/4:06:07 PM    Final    DG Chest 1 View  Result Date: 12/07/2022 CLINICAL DATA:  47 year old male history of sepsis. EXAM: CHEST  1 VIEW COMPARISON:  Chest x-ray 12/06/2022. FINDINGS: Lung volumes are normal. No consolidative airspace disease. No pleural effusions. No pneumothorax. No pulmonary nodule or mass noted. Pulmonary vasculature and the cardiomediastinal silhouette are within normal limits. Atherosclerosis in the thoracic aorta. IMPRESSION: 1.  No radiographic evidence of acute cardiopulmonary disease. 2. Aortic atherosclerosis. Electronically Signed   By: Trudie Reed M.D.   On: 12/07/2022 05:26   MR FOOT RIGHT WO CONTRAST  Result Date: 12/06/2022 CLINICAL DATA:  End-stage renal disease, congestive heart failure with worsening right foot pain. Ulcer drainage. Fever. Patient noticed small ulcer on plantar right foot 2 months ago with intermittent 10 purulent discharge. 1 week ago patient noticed a callus develop covering the ulcer and use pain nail cutter to remove the callus with copious amounts of pus coming out. 2 days ago patient started to feel severe pain of right foot extending to mid shin. Elevated white blood cell count. Evaluate for osteomyelitis. EXAM: MRI OF THE RIGHT FOREFOOT WITHOUT CONTRAST TECHNIQUE: Multiplanar, multisequence MR imaging of the right foot radiographs 12/06/2022 was performed. No intravenous contrast was administered. COMPARISON:  None Available. FINDINGS:  Bones/Joint/Cartilage Mild to moderate hallux valgus. Moderate great toe metatarsophalangeal cartilage thinning and peripheral osteophytosis. Mild great toe metatarsophalangeal joint effusion. No cortical erosion or marrow edema is seen to indicate MRI evidence of acute osteomyelitis. Ligaments The great toe metatarsophalangeal and interphalangeal collateral ligaments appear intact. The Lisfranc ligament complex is intact. Muscles and Tendons There is diffuse edema seen throughout the plantar foot musculature. Soft tissues There is mild irregularity of the plantar skin at the level of the great toe metatarsophalangeal joint (sagittal series 9, image 10 and axial series 6, image 35) likely representing a small ulcer/wound. Deep to this, there is decreased T1 increased T2 signal edema throughout the plantar great toe subcutaneous fat that also extends throughout the subcutaneous fat medial  to the great toe and slightly superomedial to the great toe. There are tiny possibly communicating regions of coalescing fluid seen measuring up to 4 mm in craniocaudal thickness within the subcutaneous fat bordering the dorsal medial base of the proximal phalanx of the great toe (axial series 6, image 33 and coronal series 7, image 19) and measuring up to 5 mm in craniocaudal thickness dorsal medial to the majority of the shaft of the proximal phalanx of the great toe (coronal series 7 images 21 through 25 and axial series 6 images 34 through 41). These are suspicious for small abscesses. There is diffuse moderate medial greater than lateral great toe and medial greater than lateral dorsal midfoot subcutaneous fat edema and swelling. IMPRESSION: 1. Small plantar skin ulcer at the level of the great toe metatarsophalangeal joint with associated soft tissue edema and swelling about the medial greater than lateral aspects of the great toe and medial greater than lateral aspects of the midfoot consistent with cellulitis. There are  regions of coalescing fluid medial to the great toe metatarsophalangeal joints and dorsal medial to the length of the proximal phalanx of the great toe suspicious for abscesses. 2. No cortical erosion or marrow edema is seen to indicate MRI evidence of acute osteomyelitis. Electronically Signed   By: Neita Garnet M.D.   On: 12/06/2022 20:14   DG Shoulder Right  Result Date: 12/06/2022 CLINICAL DATA:  pain and possible infection EXAM: RIGHT SHOULDER - 2+ VIEW COMPARISON:  None Available. FINDINGS: Borderline widening of the [facility] joint. No fracture or dislocation. Normal mineralization. No significant osseous degenerative change. IMPRESSION: Borderline widening of the [facility] joint. No fracture or other acute findings. Electronically Signed   By: Corlis Leak M.D.   On: 12/06/2022 11:42   DG Foot Complete Right  Result Date: 12/06/2022 CLINICAL DATA:  pain and possible infection EXAM: RIGHT FOOT COMPLETE - 3+ VIEW COMPARISON:  None Available. FINDINGS: No fracture or dislocation. Normal mineralization. Mild hallux valgus deformity. Small calcaneal spurs. No subcutaneous gas or radiodense foreign body. IMPRESSION: 1. No acute findings. 2. Mild hallux valgus. Electronically Signed   By: Corlis Leak M.D.   On: 12/06/2022 11:37   DG Chest 2 View  Result Date: 12/06/2022 CLINICAL DATA:  16109 Infection [id number] EXAM: CHEST - 2 VIEW COMPARISON:  02/15/2021 FINDINGS: Low lung volumes with some crowding of bronchovascular structures in the lung bases. No focal infiltrate or overt edema. Heart size and mediastinal contours are within normal limits. Aortic Atherosclerosis (ICD10-170.0). No effusion. Visualized bones unremarkable. IMPRESSION: Low lung volumes. No acute findings. Electronically Signed   By: Corlis Leak M.D.   On: 12/06/2022 11:35      LOS: 2 days    Joycelyn Das, MD [facility] Available via Epic secure chat 7am-7pm After these hours, please refer to coverage provider listed on  [email or web address] 12/08/2022, 8:52 AM

## 2022-12-08 NOTE — Progress Notes (Signed)
Subjective:  Patient ID: [name], male    DOB: September 22, 1975,  MRN: [id number]  Patient seen this afternoon for right foot infection. Relates not too much improvement in his foot today and still having a lot of pain.   Past Medical History:  Diagnosis Date   Acute respiratory failure with hypoxia (HCC) 11/30/2018   Anemia    ESRD   ESRD on hemodialysis (HCC) 06/07/2011   TTS [place]. Started HD in 2006, got transplant in 2014 lasted until Dec 2019 then went back on HD.  Has L thigh AVG as of Jun 2020.  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    History of blood transfusion    Hypertension    03/19/22- has not had high blood pressure in 5 years.   Morbid obesity (HCC)    Paroxysmal atrial fibrillation (HCC)    Pre-diabetes    no meds   Sepsis (HCC) 11/30/2018   Sleep apnea    lost weight, does not use CPAP     Past Surgical History:  Procedure Laterality Date   ARTERIOVENOUS GRAFT PLACEMENT  08/09/2010   Left Thigh Graft by Dr. Cari Caraway   AV FISTULA PLACEMENT Right 05/18/2018   Procedure: INSERTION OF ARTERIOVENOUS (AV) GORE-TEX GRAFT Left THIGH;  Surgeon: Maeola Harman, MD;  Location: [facility] OR;  Service: Vascular;  Laterality: Right;   AV FISTULA PLACEMENT Right 02/15/2021   Procedure: INSERTION OF ARTERIOVENOUS (AV) GORE-TEX LOOP GRAFT RIGHT THIGH;  Surgeon: Maeola Harman, MD;  Location: [facility] OR;  Service: Vascular;  Laterality: Right;   BIOPSY  05/20/2022   Procedure: BIOPSY;  Surgeon: Shellia Cleverly, DO;  Location: [facility];  Service: Gastroenterology;;   CAPD INSERTION N/A 06/13/2022   Procedure: LAPAROSCOPIC INSERTION CONTINUOUS AMBULATORY PERITONEAL DIALYSIS  (CAPD) CATHETER;  Surgeon: Maeola Harman, MD;  Location: [facility] OR;  Service: Vascular;  Laterality: N/A;   CAPD REMOVAL  05/08/2011   Procedure: CONTINUOUS AMBULATORY PERITONEAL DIALYSIS  (CAPD) CATHETER REMOVAL;  Surgeon: Iona Coach, MD;  Location: [facility]  OR;  Service: General;  Laterality: N/A;  Removal of CAPD catheter, Dr. requests to go after 100   CAPD REMOVAL N/A 08/08/2022   Procedure: REMOVAL CONTINUOUS AMBULATORY PERITONEAL DIALYSIS  (CAPD) CATHETER;  Surgeon: Maeola Harman, MD;  Location: [facility] OR;  Service: Vascular;  Laterality: N/A;   COLONOSCOPY WITH PROPOFOL N/A 05/20/2022   Procedure: COLONOSCOPY WITH PROPOFOL;  Surgeon: Shellia Cleverly, DO;  Location: [facility];  Service: Gastroenterology;  Laterality: N/A;   INSERTION OF DIALYSIS CATHETER  10/05/2010   Right Femoral Cath insertion by Dr. Leonides Sake.  Pt ahas had several caths inserted.   INSERTION OF DIALYSIS CATHETER Right 02/15/2021   Procedure: Attempted INSERTION OF RIGHT and Left Internal Jugular DIALYSIS CATHETER, Insertion of Left Femoral Vein Dialysis Catheter;  Surgeon: Maeola Harman, MD;  Location: [facility] OR;  Service: Vascular;  Laterality: Right;   INSERTION OF DIALYSIS CATHETER Right 04/26/2021   Procedure: INSERTION OF TUNNELED 55cm PALIDROME PRECISION CHRONIC DIALYSIS CATHETER;  Surgeon: Leonie Douglas, MD;  Location: [facility] OR;  Service: Vascular;  Laterality: Right;   INSERTION OF DIALYSIS CATHETER Left 12/26/2021   Procedure: INSERTION OF TUNNELED  DIALYSIS CATHETER LEFT FEMORAL ARTERY;  Surgeon: Maeola Harman, MD;  Location: [facility] OR;  Service: Vascular;  Laterality: Left;   INSERTION OF DIALYSIS CATHETER Left 03/06/2022   Procedure: INSERTION OF DIALYSIS CATHETER;  Surgeon: Maeola Harman, MD;  Location: [facility]  OR;  Service: Vascular;  Laterality: Left;   IR FLUORO GUIDE CV LINE LEFT  04/23/2022   IR FLUORO GUIDE CV LINE RIGHT  05/11/2018   IR FLUORO GUIDE CV LINE RIGHT  07/03/2021   IR FLUORO GUIDE CV LINE RIGHT  07/16/2021   IR PTA ADDL CENTRAL DIALYSIS SEG THRU DIALY CIRCUIT RIGHT Right 07/03/2021   IR US GUIDE VASC ACCESS LEFT  04/23/2022   IR US GUIDE VASC ACCESS RIGHT  05/11/2018   IR VENO/EXT/UNI LEFT  04/23/2022   IR  VENOCAVAGRAM IVC  07/16/2021   KIDNEY TRANSPLANT  2014   KNEE ARTHROSCOPY Left    LAPAROSCOPIC LYSIS OF ADHESIONS N/A 06/13/2022   Procedure: LAPAROSCOPIC LYSIS OF ADHESIONS;  Surgeon: Maeola Harman, MD;  Location: [facility] OR;  Service: Vascular;  Laterality: N/A;   POLYPECTOMY  05/20/2022   Procedure: POLYPECTOMY;  Surgeon: Shellia Cleverly, DO;  Location: [facility];  Service: Gastroenterology;;   THROMBECTOMY W/ EMBOLECTOMY Right 04/26/2021   Procedure: REMOVAL OF RIGHT THIGH ARTERIOVENOUS GORE-TEX GRAFT AND REPAIR OF RIGHT COMMON FEMORAL ARTERY;  Surgeon: Leonie Douglas, MD;  Location: [facility] OR;  Service: Vascular;  Laterality: Right;   UPPER EXTREMITY ANGIOGRAPHY Bilateral 05/13/2018   Procedure: UPPER EXTREMITY ANGIOGRAPHY - bilarteral;  Surgeon: Cephus Shelling, MD;  Location: [facility] INVASIVE CV LAB;  Service: Cardiovascular;  Laterality: Bilateral;       Latest Ref Rng & Units 12/07/2022    6:46 AM 12/06/2022   10:53 AM 06/13/2022    7:44 AM  CBC  WBC 4.0 - 10.5 K/uL 16.1  14.4    Hemoglobin 13.0 - 17.0 g/dL 30.8  65.7  84.6   Hematocrit 39.0 - 52.0 % 32.9  39.8  43.0   Platelets 150 - 400 K/uL 182  189         Latest Ref Rng & Units 12/08/2022   12:49 AM 12/07/2022    6:46 AM 12/06/2022   10:53 AM  BMP  Glucose 70 - 99 mg/dL 99  962  952   BUN 6 - 20 mg/dL 61  49  34   Creatinine 0.61 - 1.24 mg/dL 84.13  24.40  10.27   Sodium 135 - 145 mmol/L 130  132  133   Potassium 3.5 - 5.1 mmol/L 5.5  4.9  5.0   Chloride 98 - 111 mmol/L 92  92  91   CO2 22 - 32 mmol/L 20  23  23    Calcium 8.9 - 10.3 mg/dL 9.2  9.3  9.0      Objective:   Vitals:   12/08/22 0845 12/08/22 1127  BP: (!) 65/45   Pulse: 80   Resp: 15   Temp:  99.4 F (37.4 C)  SpO2: 97%     General:AA&O x 3. Normal mood and affect   Vascular: DP and PT pulses 2/4 bilateral. Brisk capillary refill to all digits. Pedal hair present   Neruological. Epicritic sensation grossly intact.   Derm: The  hyperkeratotic callus and ulcer to the left great toe appear stable and noncontributory to the patient's systemic sepsis Callus with plantar foot ulcer noted to the first MTP of the right foot.  No appreciable drainage today.  It appears very dry and stable.   Heavy edema with erythema noted to the foot extending into the ankle. Pain with palpation throughout the foot.  No appreciable crepitus with palpation of the foot.  MSK: MMT 5/5 in dorsiflexion, plantar flexion, inversion and eversion. Normal joint ROM  without pain or crepitus.    Vascular:  VAS [place] ABI W/WO TBI 07/25/2022  ABI Findings:  +---------+------------------+-----+---------+--------+  Right   Rt Pressure (mmHg)IndexWaveform Comment   +---------+------------------+-----+---------+--------+  Brachial 94                                        +---------+------------------+-----+---------+--------+  PTA     111               1.13 triphasic          +---------+------------------+-----+---------+--------+  DP      103               1.05 triphasic          +---------+------------------+-----+---------+--------+  Great Toe103               1.05                    +---------+------------------+-----+---------+--------+   +---------+------------------+-----+---------------------------+-------+  Left    Lt Pressure (mmHg)IndexWaveform                   Comment  +---------+------------------+-----+---------------------------+-------+  Brachial 98                                                         +---------+------------------+-----+---------------------------+-------+  PTA     112               1.14 triphasic                           +---------+------------------+-----+---------------------------+-------+  DP      99                1.01 triphasic                           +---------+------------------+-----+---------------------------+-------+  Great Toe                        2nd toe waveform is present         +---------+------------------+-----+---------------------------+-------+   +-------+-----------+--------------------------------+------------+--------  ----+  ABI/TBIToday's ABIToday's TBI                     Previous  ABIPrevious TBI  +-------+-----------+--------------------------------+------------+--------  ----+  Right 1.13       1.05                            1.24        0.66           +-------+-----------+--------------------------------+------------+--------  ----+  Left  1.14       great toe ulcer/2nd toe         1.16        1.16                             waveforms present.                                         +-------+-----------+--------------------------------+------------+--------  ----+  Previous ABI at [facility] on 12/28/20.  Summary:  Right: Resting right ankle-brachial index is within normal range. The right toe-brachial index is normal.  Left: Resting left ankle-brachial index is within normal range. Great toe ulcer. Second toe waveform is present   DG FOOT COMPLETE RIGHT 12/06/2022 FINDINGS: No fracture or dislocation. Normal mineralization. Mild hallux valgus deformity. Small calcaneal spurs. No subcutaneous gas or radiodense foreign body.   IMPRESSION: 1. No acute findings. 2. Mild hallux valgus.   MR FOOT RIGHT WO CONTRAST 12/06/2022 IMPRESSION: 1. Small plantar skin ulcer at the level of the great toe metatarsophalangeal joint with associated soft tissue edema and swelling about the medial greater than lateral aspects of the great toe and medial greater than lateral aspects of the midfoot consistent with cellulitis. There are regions of coalescing fluid medial to the great toe metatarsophalangeal joints and dorsal medial to the length of the proximal phalanx of the great toe suspicious for abscesses. 2. No cortical erosion or marrow edema is seen to indicate MRI evidence of acute  osteomyelitis.  Assessment & Plan:  Patient was evaluated and treated and all questions answered.  DX: Right foot abscess and diabetic foot infection Antibiotics: Continue IV cefepime  Discussed with patient diagnosis and treatment options.  Given no improvement will continue with plan to take to the OR this afternoon/evening for irrigation and debridement of right foot infection. Continue NPO Patient in agreement with plan and all questions answered.   Louann Sjogren, DPM  Accessible via secure chat for questions or concerns.

## 2022-12-09 ENCOUNTER — Encounter (HOSPITAL_COMMUNITY): Payer: Self-pay | Admitting: Podiatry

## 2022-12-09 ENCOUNTER — Encounter (HOSPITAL_COMMUNITY): Payer: Medicare HMO

## 2022-12-09 DIAGNOSIS — M86171 Other acute osteomyelitis, right ankle and foot: Secondary | ICD-10-CM | POA: Diagnosis not present

## 2022-12-09 DIAGNOSIS — N186 End stage renal disease: Secondary | ICD-10-CM | POA: Diagnosis not present

## 2022-12-09 DIAGNOSIS — I10 Essential (primary) hypertension: Secondary | ICD-10-CM | POA: Diagnosis not present

## 2022-12-09 DIAGNOSIS — L97519 Non-pressure chronic ulcer of other part of right foot with unspecified severity: Secondary | ICD-10-CM | POA: Diagnosis not present

## 2022-12-09 LAB — BASIC METABOLIC PANEL
Anion gap: 20 — ABNORMAL HIGH (ref 5–15)
BUN: 76 mg/dL — ABNORMAL HIGH (ref 6–20)
CO2: 20 mmol/L — ABNORMAL LOW (ref 22–32)
Calcium: 9.5 mg/dL (ref 8.9–10.3)
Chloride: 90 mmol/L — ABNORMAL LOW (ref 98–111)
Creatinine, Ser: 17.52 mg/dL — ABNORMAL HIGH (ref 0.61–1.24)
GFR, Estimated: 3 mL/min — ABNORMAL LOW (ref >60)
Glucose, Bld: 106 mg/dL — ABNORMAL HIGH (ref 70–99)
Potassium: 5.5 mmol/L — ABNORMAL HIGH (ref 3.5–5.1)
Sodium: 130 mmol/L — ABNORMAL LOW (ref 135–145)

## 2022-12-09 LAB — AEROBIC/ANAEROBIC CULTURE W GRAM STAIN (SURGICAL/DEEP WOUND)

## 2022-12-09 LAB — CBC
HCT: 30 % — ABNORMAL LOW (ref 39.0–52.0)
Hemoglobin: 9.6 g/dL — ABNORMAL LOW (ref 13.0–17.0)
MCH: 29.4 pg (ref 26.0–34.0)
MCHC: 32 g/dL (ref 30.0–36.0)
MCV: 91.7 fL (ref 80.0–100.0)
Platelets: 218 10*3/uL (ref 150–400)
RBC: 3.27 MIL/uL — ABNORMAL LOW (ref 4.22–5.81)
RDW: 18.1 % — ABNORMAL HIGH (ref 11.5–15.5)
WBC: 15 10*3/uL — ABNORMAL HIGH (ref 4.0–10.5)
nRBC: 0 % (ref 0.0–0.2)

## 2022-12-09 LAB — GLUCOSE, CAPILLARY
Glucose-Capillary: 82 mg/dL (ref 70–99)
Glucose-Capillary: 112 mg/dL — ABNORMAL HIGH (ref 70–99)
Glucose-Capillary: 94 mg/dL (ref 70–99)

## 2022-12-09 LAB — MAGNESIUM: Magnesium: 2.1 mg/dL (ref 1.7–2.4)

## 2022-12-09 LAB — HEPATITIS B SURFACE ANTIBODY, QUANTITATIVE: Hep B S AB Quant (Post): 73.8 m[IU]/mL

## 2022-12-09 MED ORDER — ANTICOAGULANT SODIUM CITRATE 4% (200MG/5ML) IV SOLN: 5.0000 mL | Status: DC | PRN

## 2022-12-09 MED ORDER — HEPARIN SODIUM (PORCINE) 1000 UNIT/ML DIALYSIS
1000.0000 [IU] | INTRAMUSCULAR | Status: DC | PRN
  Administered 2022-12-09: 4600 [IU]

## 2022-12-09 MED ORDER — ALTEPLASE 2 MG IJ SOLR: 2.0000 mg | Freq: Once | INTRAMUSCULAR | Status: DC | PRN

## 2022-12-09 MED ORDER — SODIUM CHLORIDE 0.9 % IV SOLN
2.0000 g | INTRAVENOUS | Status: DC
  Administered 2022-12-09: 2 g via INTRAVENOUS
  Filled 2022-12-09: qty 12.5

## 2022-12-09 MED ORDER — HEPARIN SODIUM (PORCINE) 1000 UNIT/ML DIALYSIS
2500.0000 [IU] | INTRAMUSCULAR | Status: DC | PRN
  Administered 2022-12-09: 2500 [IU] via INTRAVENOUS_CENTRAL
  Filled 2022-12-09: qty 3

## 2022-12-09 MED ORDER — HEPARIN SODIUM (PORCINE) 1000 UNIT/ML IJ SOLN: 2000.0000 [IU] | Freq: Once | INTRAMUSCULAR | Status: DC

## 2022-12-09 MED ORDER — HEPARIN SODIUM (PORCINE) 1000 UNIT/ML DIALYSIS
5000.0000 [IU] | Freq: Once | INTRAMUSCULAR | Status: AC
  Administered 2022-12-09: 5000 [IU] via INTRAVENOUS_CENTRAL
  Filled 2022-12-09 (×2): qty 5

## 2022-12-09 NOTE — Progress Notes (Signed)
Dear Doctor: This patient has been identified as a candidate for CVC (no PICC or mldine d/t renal function) for the following reason (s): IV therapy over 48 hours, poor veins/poor circulatory system (CHF, COPD, emphysema, diabetes, steroid use, IV drug abuse, etc.), and restarts due to phlebitis and infiltration in 24 hours (bilateral arms have been assessed for PIV placement with [place] vein branching or to deep for placement) If you agree, please write an order for the indicated device.   Thank you for supporting the early vascular access assessment program.

## 2022-12-09 NOTE — Evaluation (Signed)
Physical Therapy Evaluation Patient Details Name: [name] MRN: [id number] DOB: Jan 28, 1976 Today's Date: 12/09/2022  History of Present Illness  47 y.o. male presented 12/06/22 with worsening of right foot pain, ulcer drainage and fever. Foot cellulitis; MRI rt foot negative for osteomyelitis. S/p I&D RLE on 7/1. 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   Clinical Impression  Upon evaluation, patient received supine with HOB elevated alert and oriented x4. Prior to admission, patient endorsed independence with functional mobility and ADLs. Currently, the patient is independent with bed mobility and transfers. Required assistance with step pivot transfer to sink in order to clean face. Verbally cued patient for WB restrictions on RLE; pt unable to maintain heel restriction for RLE with minor LOB on step pivot transfer. Recommend next session to progress gait and stair training with post-op boot; PT to reinforce WB precautions for RLE. PT called regarding pending status on post-op boot order. Anticipating patient to return home with additional skilled rehab in order to improve functional mobility, independence and promote safety.       Assistance Recommended at Discharge Intermittent Supervision/Assistance  If plan is discharge home, recommend the following:  Can travel by private vehicle  A little help with walking and/or transfers;A little help with bathing/dressing/bathroom;Assist for transportation;Help with stairs or ramp for entrance        Equipment Recommendations Rolling walker (2 wheels)  Recommendations for Other Services       Functional Status Assessment Patient has had a recent decline in their functional status and demonstrates the ability to make significant improvements in function in  a reasonable and predictable amount of time.     Precautions / Restrictions Precautions Precautions: Fall Required Braces or Orthoses: Other Brace Restrictions Weight Bearing Restrictions: Yes Other Position/Activity Restrictions: heel WB in surgical shoe (surgical shoe not delivered, PT called ortho tech. PT and SPT encouraged heel WB vs NWB if unable to maintain and limited mobiity to step pivot only given waiting for surgical shoe)      Mobility  Bed Mobility Overal bed mobility: Modified Independent             General bed mobility comments: Patient able to perform without physical assistance; increased time and HOB elevated    Transfers Overall transfer level: Needs assistance Equipment used: Rolling walker (2 wheels) Transfers: Sit to/from Stand, Bed to chair/wheelchair/BSC Sit to Stand: Independent   Step pivot transfers: Min guard       General transfer comment: Transferred from EOB without physical assistance; strong power up with bilat. UE support; Increased time for step pivot transfer to sink. VC provided for WB precautions; pt inconsistently maintained WB precautions on RLE. Minor LOB with pivot to sink requiring min G for recovery.    Ambulation/Gait               General Gait Details: Deferred until next session, patient to receive post-op boot  Stairs            Wheelchair Mobility     Tilt Bed    Modified Rankin (Stroke Patients Only)       Balance Overall balance assessment: Needs assistance Sitting-balance support: No upper extremity supported, Feet supported Sitting balance-Leahy Scale: Good Sitting balance - Comments: Sitting EOB without UE support   Standing balance support: Single extremity supported, During functional activity, Reliant on assistive  device for balance Standing balance-Leahy Scale: Poor Standing balance comment: Required RW for single LE balance; patient able to use single or bilateral UE on RW support at  sink.                             Pertinent Vitals/Pain Pain Assessment Pain Assessment: No/denies pain    Home Living Family/patient expects to be discharged to:: Private residence Living Arrangements: Alone Available Help at Discharge: Family;Friend(s);Available 24 hours/day Type of Home: Mobile home Home Access: Stairs to enter Entrance Stairs-Rails: Left Entrance Stairs-Number of Steps: 3   Home Layout: One level Home Equipment: None      Prior Function Prior Level of Function : Independent/Modified Independent;Driving                     Hand Dominance   Dominant Hand: Right    Extremity/Trunk Assessment   Upper Extremity Assessment Upper Extremity Assessment: Overall WFL for tasks assessed    Lower Extremity Assessment Lower Extremity Assessment: Overall WFL for tasks assessed    Cervical / Trunk Assessment Cervical / Trunk Assessment: Normal  Communication   Communication: No difficulties  Cognition Arousal/Alertness: Awake/alert Behavior During Therapy: WFL for tasks assessed/performed Overall Cognitive Status: Within Functional Limits for tasks assessed                                 General Comments: A&Ox4; soft spoken        General Comments      Exercises     Assessment/Plan    PT Assessment Patient needs continued PT services  PT Problem List Decreased activity tolerance;Decreased range of motion;Decreased strength;Decreased balance;Decreased mobility;Decreased safety awareness;Decreased knowledge of precautions       PT Treatment Interventions DME instruction;Gait training;Stair training;Functional mobility training;Balance training;Neuromuscular re-education;Patient/family education;Therapeutic activities;Therapeutic exercise    PT Goals (Current goals can be found in the Care Plan section)  Acute Rehab PT Goals Patient Stated Goal: Patient would like to return home with less pain PT Goal  Formulation: With patient Time For Goal Achievement: 12/23/22 Potential to Achieve Goals: Good    Frequency Min 1X/week     Co-evaluation               AM-PAC PT "6 Clicks" Mobility  Outcome Measure Help needed turning from your back to your side while in a flat bed without using bedrails?: None Help needed moving from lying on your back to sitting on the side of a flat bed without using bedrails?: None Help needed moving to and from a bed to a chair (including a wheelchair)?: None Help needed standing up from a chair using your arms (e.g., wheelchair or bedside chair)?: None Help needed to walk in hospital room?: A Little Help needed climbing 3-5 steps with a railing? : A Little 6 Click Score: 22    End of Session Equipment Utilized During Treatment: Gait belt Activity Tolerance: Patient tolerated treatment well Patient left: in bed;with call bell/phone within reach;with nursing/sitter in room Nurse Communication: Mobility status PT Visit Diagnosis: Other abnormalities of gait and mobility (R26.89);Difficulty in walking, not elsewhere classified (R26.2);Pain Pain - Right/Left: Right Pain - part of body: Ankle and joints of foot    Time: 1520-1539 PT Time Calculation (min) (ACUTE ONLY): 19 min   Charges:   PT Evaluation $PT Eval Low Complexity: 1 Low   PT  General Charges $$ ACUTE PT VISIT: 1 Visit         Christene Lye, SPT Acute Rehabilitation Services [phone] Secure chat preferred    Christene Lye 12/09/2022, 4:56 PM

## 2022-12-09 NOTE — Procedures (Signed)
HD Note:  Some information was entered later than the data was gathered due to patient care needs. The stated time with the data is accurate.  Received patient in bed to unit.  Alert and oriented.  Informed consent signed and in chart.   TX duration: 4.25 hours, time was added to complete antibiotic administration.  See [facility]  Patient goal decreased from 2000 ml to 1500 ml during the first half hour of treatment related to BP.   Transported back to the room  Alert, without acute distress.  Hand-off given to patient's nurse.   Access used: Left femoral HD catheter Access issues: Lines had to be reversed. BFR had to be reduced to 350 to stay WDL  Total UF removed: 1500 ml   [name]

## 2022-12-09 NOTE — Progress Notes (Signed)
Orthopedic Tech Progress Note Patient Details:  [name] [facility] 02/10/1976 [id number]  Ortho Devices Type of Ortho Device: Darco shoe Ortho Device/Splint Location: RLE Ortho Device/Splint Interventions: Ordered   Post Interventions Patient Tolerated: Well Instructions Provided: Care of device  [name] 12/09/2022, 6:19 PM

## 2022-12-09 NOTE — Progress Notes (Signed)
Subjective:  Patient ID: [name], male    DOB: March 31, 1976,  MRN: [id number]  Patient POD#1 s/p right foot Incision and drainage. Patient relates continued pain and not sure if any better. Denies n/v/f/c.   Past Medical History:  Diagnosis Date   Acute respiratory failure with hypoxia (HCC) 11/30/2018   Anemia    ESRD   ESRD on hemodialysis (HCC) 06/07/2011   TTS [place] [facility]. Started HD in 2006, got transplant in 2014 lasted until Dec 2019 then went back on HD.  Has L thigh AVG as of Jun 2020.  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    History of blood transfusion    Hypertension    03/19/22- has not had high blood pressure in 5 years.   Morbid obesity (HCC)    Paroxysmal atrial fibrillation (HCC)    Pre-diabetes    no meds   Sepsis (HCC) 11/30/2018   Sleep apnea    lost weight, does not use CPAP     Past Surgical History:  Procedure Laterality Date   ARTERIOVENOUS GRAFT PLACEMENT  08/09/2010   Left Thigh Graft by Dr. Cari Caraway   AV FISTULA PLACEMENT Right 05/18/2018   Procedure: INSERTION OF ARTERIOVENOUS (AV) GORE-TEX GRAFT Left THIGH;  Surgeon: Maeola Harman, MD;  Location: [facility] OR;  Service: Vascular;  Laterality: Right;   AV FISTULA PLACEMENT Right 02/15/2021   Procedure: INSERTION OF ARTERIOVENOUS (AV) GORE-TEX LOOP GRAFT RIGHT THIGH;  Surgeon: Maeola Harman, MD;  Location: [facility] OR;  Service: Vascular;  Laterality: Right;   BIOPSY  05/20/2022   Procedure: BIOPSY;  Surgeon: Shellia Cleverly, DO;  Location: [facility];  Service: Gastroenterology;;   CAPD INSERTION N/A 06/13/2022   Procedure: LAPAROSCOPIC INSERTION CONTINUOUS AMBULATORY PERITONEAL DIALYSIS  (CAPD) CATHETER;  Surgeon: Maeola Harman, MD;  Location: [facility] OR;  Service: Vascular;  Laterality: N/A;   CAPD REMOVAL  05/08/2011   Procedure: CONTINUOUS AMBULATORY PERITONEAL DIALYSIS  (CAPD) CATHETER REMOVAL;  Surgeon: Iona Coach, MD;  Location: [facility] OR;   Service: General;  Laterality: N/A;  Removal of CAPD catheter, Dr. requests to go after 100   CAPD REMOVAL N/A 08/08/2022   Procedure: REMOVAL CONTINUOUS AMBULATORY PERITONEAL DIALYSIS  (CAPD) CATHETER;  Surgeon: Maeola Harman, MD;  Location: [facility] OR;  Service: Vascular;  Laterality: N/A;   COLONOSCOPY WITH PROPOFOL N/A 05/20/2022   Procedure: COLONOSCOPY WITH PROPOFOL;  Surgeon: Shellia Cleverly, DO;  Location: [facility];  Service: Gastroenterology;  Laterality: N/A;   INSERTION OF DIALYSIS CATHETER  10/05/2010   Right Femoral Cath insertion by Dr. Leonides Sake.  Pt ahas had several caths inserted.   INSERTION OF DIALYSIS CATHETER Right 02/15/2021   Procedure: Attempted INSERTION OF RIGHT and Left Internal Jugular DIALYSIS CATHETER, Insertion of Left Femoral Vein Dialysis Catheter;  Surgeon: Maeola Harman, MD;  Location: [facility] OR;  Service: Vascular;  Laterality: Right;   INSERTION OF DIALYSIS CATHETER Right 04/26/2021   Procedure: INSERTION OF TUNNELED 55cm PALIDROME PRECISION CHRONIC DIALYSIS CATHETER;  Surgeon: Leonie Douglas, MD;  Location: [facility] OR;  Service: Vascular;  Laterality: Right;   INSERTION OF DIALYSIS CATHETER Left 12/26/2021   Procedure: INSERTION OF TUNNELED  DIALYSIS CATHETER LEFT FEMORAL ARTERY;  Surgeon: Maeola Harman, MD;  Location: [facility] OR;  Service: Vascular;  Laterality: Left;   INSERTION OF DIALYSIS CATHETER Left 03/06/2022   Procedure: INSERTION OF DIALYSIS CATHETER;  Surgeon: Maeola Harman, MD;  Location: [facility] OR;  Service: Vascular;  Laterality: Left;   IR FLUORO GUIDE CV LINE LEFT  04/23/2022   IR FLUORO GUIDE CV LINE RIGHT  05/11/2018   IR FLUORO GUIDE CV LINE RIGHT  07/03/2021   IR FLUORO GUIDE CV LINE RIGHT  07/16/2021   IR PTA ADDL CENTRAL DIALYSIS SEG THRU DIALY CIRCUIT RIGHT Right 07/03/2021   IR US GUIDE VASC ACCESS LEFT  04/23/2022   IR US GUIDE VASC ACCESS RIGHT  05/11/2018   IR VENO/EXT/UNI LEFT  04/23/2022   IR  VENOCAVAGRAM IVC  07/16/2021   KIDNEY TRANSPLANT  2014   KNEE ARTHROSCOPY Left    LAPAROSCOPIC LYSIS OF ADHESIONS N/A 06/13/2022   Procedure: LAPAROSCOPIC LYSIS OF ADHESIONS;  Surgeon: Maeola Harman, MD;  Location: [facility] OR;  Service: Vascular;  Laterality: N/A;   POLYPECTOMY  05/20/2022   Procedure: POLYPECTOMY;  Surgeon: Shellia Cleverly, DO;  Location: [facility];  Service: Gastroenterology;;   THROMBECTOMY W/ EMBOLECTOMY Right 04/26/2021   Procedure: REMOVAL OF RIGHT THIGH ARTERIOVENOUS GORE-TEX GRAFT AND REPAIR OF RIGHT COMMON FEMORAL ARTERY;  Surgeon: Leonie Douglas, MD;  Location: [facility] OR;  Service: Vascular;  Laterality: Right;   UPPER EXTREMITY ANGIOGRAPHY Bilateral 05/13/2018   Procedure: UPPER EXTREMITY ANGIOGRAPHY - bilarteral;  Surgeon: Cephus Shelling, MD;  Location: [facility];  Service: Cardiovascular;  Laterality: Bilateral;       Latest Ref Rng & Units 12/09/2022    1:27 AM 12/08/2022    4:33 PM 12/07/2022    6:46 AM  CBC  WBC 4.0 - 10.5 K/uL 15.0   16.1   Hemoglobin 13.0 - 17.0 g/dL 9.6  60.4  54.0   Hematocrit 39.0 - 52.0 % 30.0  37.0  32.9   Platelets 150 - 400 K/uL 218   182        Latest Ref Rng & Units 12/09/2022    1:27 AM 12/08/2022    5:44 PM 12/08/2022    4:33 PM  BMP  Glucose 70 - 99 mg/dL 981  191  478   BUN 6 - 20 mg/dL 76  69  64   Creatinine 0.61 - 1.24 mg/dL 29.56  21.30  86.57   Sodium 135 - 145 mmol/L 130  130  127   Potassium 3.5 - 5.1 mmol/L 5.5  5.4  6.1   Chloride 98 - 111 mmol/L 90  91  97   CO2 22 - 32 mmol/L 20  22    Calcium 8.9 - 10.3 mg/dL 9.5  9.1       Objective:   Vitals:   12/09/22 0326 12/09/22 0726  BP: (!) 107/58 96/63  Pulse: 80 67  Resp: 19 11  Temp: 98 F (36.7 C) 98 F (36.7 C)  SpO2: 98% 98%    General:AA&O x 3. Normal mood and affect   Vascular: DP and PT pulses 2/4 bilateral. Brisk capillary refill to all digits. Pedal hair present   Neruological. Epicritic sensation grossly intact.    Derm: Surgical wound with no purulence noted and mildly improved edema and erythema surrounding plantar wound and dorsal foot. Still very tender.   MSK: MMT 5/5 in dorsiflexion, plantar flexion, inversion and eversion. Normal joint ROM without pain or crepitus.    Vascular:  VAS [place] ABI W/WO TBI 07/25/2022  ABI Findings:  +---------+------------------+-----+---------+--------+  Right   Rt Pressure (mmHg)IndexWaveform Comment   +---------+------------------+-----+---------+--------+  Brachial 94                                        +---------+------------------+-----+---------+--------+  PTA     111               1.13 triphasic          +---------+------------------+-----+---------+--------+  DP      103               1.05 triphasic          +---------+------------------+-----+---------+--------+  Great Toe103               1.05                    +---------+------------------+-----+---------+--------+   +---------+------------------+-----+---------------------------+-------+  Left    Lt Pressure (mmHg)IndexWaveform                   Comment  +---------+------------------+-----+---------------------------+-------+  Brachial 98                                                         +---------+------------------+-----+---------------------------+-------+  PTA     112               1.14 triphasic                           +---------+------------------+-----+---------------------------+-------+  DP      99                1.01 triphasic                           +---------+------------------+-----+---------------------------+-------+  Great Toe                       2nd toe waveform is present         +---------+------------------+-----+---------------------------+-------+   +-------+-----------+--------------------------------+------------+--------  ----+  ABI/TBIToday's ABIToday's TBI                     Previous   ABIPrevious TBI  +-------+-----------+--------------------------------+------------+--------  ----+  Right 1.13       1.05                            1.24        0.66           +-------+-----------+--------------------------------+------------+--------  ----+  Left  1.14       great toe ulcer/2nd toe         1.16        1.16                             waveforms present.                                         +-------+-----------+--------------------------------+------------+--------  ----+  Previous ABI at [facility] on 12/28/20.  Summary:  Right: Resting right ankle-brachial index is within normal range. The right toe-brachial index is normal.  Left: Resting left ankle-brachial index is within normal range. Great toe ulcer. Second toe waveform is present   DG FOOT COMPLETE RIGHT 12/06/2022 FINDINGS: No fracture or dislocation. Normal mineralization. Mild hallux valgus deformity. Small  calcaneal spurs. No subcutaneous gas or radiodense foreign body.   IMPRESSION: 1. No acute findings. 2. Mild hallux valgus.   MR FOOT RIGHT WO CONTRAST 12/06/2022 IMPRESSION: 1. Small plantar skin ulcer at the level of the great toe metatarsophalangeal joint with associated soft tissue edema and swelling about the medial greater than lateral aspects of the great toe and medial greater than lateral aspects of the midfoot consistent with cellulitis. There are regions of coalescing fluid medial to the great toe metatarsophalangeal joints and dorsal medial to the length of the proximal phalanx of the great toe suspicious for abscesses. 2. No cortical erosion or marrow edema is seen to indicate MRI evidence of acute osteomyelitis.  Assessment & Plan:  Patient was evaluated and treated and all questions answered.  DX: Right foot abscess and diabetic foot infection Dressing and packing changed today at bedside. No signs of purulence currently. Will plan for daily dressing  changes.  Antibiotics: Continue IV cefepime. Will follow culture results.  Patient may be heel weight bearing to operative extremity in surgical shoe.  Will continue to monitor for next 24-48 hours for improvement.  Podiatry to continue to follow   Louann Sjogren, DPM  Accessible via secure chat for questions or concerns.

## 2022-12-09 NOTE — Anesthesia Postprocedure Evaluation (Signed)
Anesthesia Post Note  Patient: Daequan Viggiano Grigoryan  Procedure(s) Performed: IRRIGATION AND DEBRIDEMENT RIGHT FOOT (Right: Foot)     Patient location during evaluation: PACU Anesthesia Type: General Level of consciousness: awake and alert Pain management: pain level controlled Vital Signs Assessment: post-procedure vital signs reviewed and stable Respiratory status: spontaneous breathing, nonlabored ventilation, respiratory function stable and patient connected to nasal cannula oxygen Cardiovascular status: blood pressure returned to baseline and stable Postop Assessment: no apparent nausea or vomiting Anesthetic complications: no   No notable events documented.  Last Vitals:  Vitals:   12/09/22 1328 12/09/22 1619  BP: (!) 74/51 (!) 91/56  Pulse: 77 78  Resp: 16 17  Temp: 37.6 C 37.3 C  SpO2: 99% 96%    Last Pain:  Vitals:   12/09/22 1619  TempSrc: Oral  PainSc:    Pain Goal: Patients Stated Pain Goal: 2 (12/08/22 1611)                 Billye Nydam

## 2022-12-09 NOTE — Progress Notes (Signed)
Pt receives out-pt HD at [facility] on TTS. Will assist as needed.    [facility] [facility]al Navigator [phone] 

## 2022-12-09 NOTE — Progress Notes (Signed)
PROGRESS NOTE    [name]  WUJ:[id number] DOB: [birth year] DOA: 12/06/2022 PCP: [name], MD    Brief Narrative:   47 year old male with past medical history of  ESRD on TTS iHD via Right femoral TDC, on Eliquis for recurrent catheter/ clotted grafts and PAF, HFpEF/ cor pulmonale, OSA not on CPAP presented to the hospital with right foot ulcer for 2 months.  1 week prior to presentation he tried to shave a callus and applied steroid cream and since then started draining locally with increasing pain, warmth and redness.  He also had chills without any fever.  He did go to dialysis but his blood pressure was very low and he was trying to stay off midodrine to be on transplant list.  In ER, patient was hypotensive and febrile with tachycardia.  Labs showed leukocytosis with WBC 14.4, lactic 3.2> 2.7, K 5, bicarb 23.  CXR showed low lung volumes, Right  shoulder xray with borderline widening of AC joint otherwise negative for acute injury, right foot Xray without acute findings, neg for subcutaneous gas.  Patient was initially treated with vancomycin and cefepime.  Given persistently low blood pressure, PCCM was consulted and was treated in the ICU.  Subsequently, patient was considered stable for transfer out of the ICU.  At this time, patient has undergone I& D of the foot abscess.  Awaiting for culture and sensitivity.  Assessment and plan.  Severe sepsis from right foot cellulitis,/ abscess  Right foot x-ray was negative.  MRI was negative for osteomyelitis but possible right great toe abscess.  Podiatry was consulted and underwent incision and drainage of the right foot abscess on 12/08/2022. Hemoglobin A1c of 5.7.  Currently on vancomycin and cefepime. Check ABI.  Initial blood cultures showed staph epidermidis.  Operative culture with no WBC or organisms so far.  Will continue to follow.   Hypotension, chronic Reports blood pressure in the 90s at home.  Suspect his BP is much better,  just inaccurate readings given his PVD.  Patient had refused Aline. - SBP goal > 70, MAP > 50 for now with intact mental status. On increased dose of midodrine to 15 mg 3 times daily.  Hyperkalemia.  Can be adjusted with hemodialysis.  Patient undergoing hemodialysis today.  HFpEF/ cor pulmonale  2D echocardiogram shows LV ejection fraction of 55 to 60%.   OSA not on CPAP Needs a repeat sleep study as outpatient.  Patient had been refusing CPAP at nighttime.   ESRD on TTS iHD via R femoral TDC (last access option per pt)  Nephrology on board for dialysis needs.   PAF on Eliquis  Currently normal sinus rhythm.  Continue Eliquis.  Not on any rate controlling medications.   Morbid obesity, BMI 39.5 kg/m2. Would benefit from weight loss as outpatient.    DVT prophylaxis: SCDs Start: 12/06/22 1433 apixaban (ELIQUIS) tablet 5 mg   Code Status:     Code Status: Full Code  Disposition:  Likely home with home health in 2 to 3 days  Status is: Inpatient  Remains inpatient appropriate because: IV antibiotic, status post surgical intervention, hypotension   Family Communication: None at bedside  Consultants:  PCCM Podiatry Nephrology  Procedures:  Hemodialysis  Antimicrobials:  Vancomycin and cefepime  Anti-infectives (From admission, onward)    Start     Dose/Rate Route Frequency Ordered Stop   12/09/22 1200  vancomycin (VANCOREADY) IVPB 1500 mg/300 mL        1,500 mg  150 mL/hr over 120 Minutes Intravenous Every T-Th-Sa (Hemodialysis) 12/06/22 1710     12/06/22 1200  ceFEPIme (MAXIPIME) 1 g in sodium chloride 0.9 % 100 mL IVPB        1 g 200 mL/hr over 30 Minutes Intravenous Every 24 hours 12/06/22 1157     12/06/22 1200  vancomycin (VANCOCIN) IVPB 1000 mg/200 mL premix       See Hyperspace for full Linked Orders Report.   1,000 mg 200 mL/hr over 60 Minutes Intravenous  Once 12/06/22 1157 12/06/22 1442   12/06/22 1200  vancomycin (VANCOREADY) IVPB 1500 mg/300 mL        See Hyperspace for full Linked Orders Report.   1,500 mg 150 mL/hr over 120 Minutes Intravenous  Once 12/06/22 1157 12/06/22 1650      Subjective:  Today, patient was seen and examined at bedside.  Seen during hemodialysis.  Feels slightly sleepy.  Denies overt foot pain but had not taken medications this morning.   Objective: Vitals:   12/09/22 0840 12/09/22 0900 12/09/22 0930 12/09/22 1000  BP: (!) 97/55 (!) 76/58 (!) 87/56 (!) 80/52  Pulse: 71 87 73 75  Resp: 18 (!) 25 16 19   Temp:      TempSrc:      SpO2: 93% 94% 92% 96%  Weight:      Height:        Intake/Output Summary (Last 24 hours) at 12/09/2022 1034 Last data filed at 12/09/2022 0318 Gross per 24 hour  Intake 439.89 ml  Output 50 ml  Net 389.89 ml    Filed Weights   12/08/22 0500 12/09/22 0622 12/09/22 0726  Weight: (!) 145.5 kg (!) 146.2 kg (!) 145.9 kg    Physical Examination: Body mass index is 41.3 kg/m.   General: Morbidly obese built, not in obvious distress HENT:   No scleral pallor or icterus noted. Oral mucosa is moist.  Chest:  Clear breath sounds.  No crackles or wheezes.  CVS: S1 &S2 heard. No murmur.  Regular rate and rhythm. Abdomen: Soft, nontender, nondistended.  Bowel sounds are heard.   Extremities: Left thigh AV graft , right femoral AV graft, hemodialysis catheter left groin.  Right foot  on elastic compression bandage status post debridement.Marland Kitchen Psych: Alert, awake and oriented, normal mood CNS:  No cranial nerve deficits.  Power equal in all extremities.   Skin: Warm and dry.  Right foot and elastic compression bandage.  Data Reviewed:   CBC: Recent Labs  Lab 12/06/22 1053 12/07/22 0646 12/08/22 1633 12/09/22 0127  WBC 14.4* 16.1*  --  15.0*  NEUTROABS 11.3*  --   --   --   HGB 12.6* 10.5* 12.6* 9.6*  HCT 39.8 32.9* 37.0* 30.0*  MCV 92.8 94.5  --  91.7  PLT 189 182  --  218     Basic Metabolic Panel: Recent Labs  Lab 12/06/22 1053 12/07/22 0646 12/08/22 0049  12/08/22 1633 12/08/22 1744 12/09/22 0127  NA 133* 132* 130* 127* 130* 130*  K 5.0 4.9 5.5* 6.1* 5.4* 5.5*  CL 91* 92* 92* 97* 91* 90*  CO2 23 23 20*  --  22 20*  GLUCOSE 126* 125* 99 101* 108* 106*  BUN 34* 49* 61* 64* 69* 76*  CREATININE 12.51* 14.47* 15.72* 16.90* 16.85* 17.52*  CALCIUM 9.0 9.3 9.2  --  9.1 9.5  MG  --  2.2  --   --   --  2.1  PHOS  --  6.3*  --   --   --   --  Liver Function Tests: Recent Labs  Lab 12/06/22 1053  AST 26  ALT 31  ALKPHOS 58  BILITOT 1.1  PROT 8.5*  ALBUMIN 3.8      Radiology Studies: DG Foot 2 Views Right  Result Date: 12/08/2022 CLINICAL DATA:  Diabetic foot ulcer EXAM: RIGHT FOOT - 2 VIEW COMPARISON:  MRI 12/06/2022, radiograph 12/06/2022 potentially due to surgical change if there was intervention here, otherwise gas-forming infection FINDINGS: No fracture or malalignment. Vascular calcifications. Moderate plantar calcaneal spur. Soft tissue edema. Mild hallux valgus deformity first MTP joint. No obvious osseous destructive change. Small gas within the soft tissues at the level of first MTP but also gas collection medial and dorsal aspect at the level of the midfoot. IMPRESSION: 1. No definite acute osseous abnormality. 2. Diffuse soft tissue edema. Small amount of gas at the level of first MTP presumably postsurgical. Additional foci of gas collection medial and dorsal aspect of the midfoot, possible postoperative gas though slightly distant from reported surgical site at first MTP and would correlate with the patient's physical exam. Electronically Signed   By: Jasmine Pang M.D.   On: 12/08/2022 22:06   ECHOCARDIOGRAM COMPLETE  Result Date: 12/07/2022    ECHOCARDIOGRAM REPORT   Patient Name:   DRAELYN SERVICE Date of Exam: 12/07/2022 Medical Rec #:  [id number]         Height:       74.0 in Accession #:    [id number]        Weight:       312.6 lb Date of Birth:  Sep 27, 1975         BSA:          2.628 m Patient Age:    47 years           BP:           82/52 mmHg Patient Gender: M                 HR:           90 bpm. Exam Location:  Inpatient Procedure: 2D Echo, Color Doppler and Cardiac Doppler Indications:    Fever  History:        Patient has prior history of Echocardiogram examinations, most                 recent 03/21/2020. CHF, Arrythmias:Atrial Fibrillation; Risk                 Factors:Hypertension, Dyslipidemia and Sleep Apnea.  Sonographer:    Irving Burton Senior RDCS Referring Phys: [id number] Josephine Igo  Sonographer Comments: Technically difficult due to patient body habitus. IMPRESSIONS  1. Left ventricular ejection fraction, by estimation, is 55 to 60%. The left ventricle has normal function. The left ventricle has no regional wall motion abnormalities. Left ventricular diastolic parameters were normal.  2. Right ventricular systolic function was not well visualized. The right ventricular size is normal. Tricuspid regurgitation signal is inadequate for assessing PA pressure.  3. The mitral valve is grossly normal. No evidence of mitral valve regurgitation. No evidence of mitral stenosis.  4. The aortic valve was not well visualized. Aortic valve regurgitation is not visualized. No aortic stenosis is present.  5. The inferior vena cava is normal in size with greater than 50% respiratory variability, suggesting right atrial pressure of 3 mmHg. FINDINGS  Left Ventricle: Left ventricular ejection fraction, by estimation, is 55 to 60%. The left ventricle has normal function. The left ventricle has  no regional wall motion abnormalities. The left ventricular internal cavity size was normal in size. There is  no left ventricular hypertrophy. Left ventricular diastolic parameters were normal. Right Ventricle: The right ventricular size is normal. No increase in right ventricular wall thickness. Right ventricular systolic function was not well visualized. Tricuspid regurgitation signal is inadequate for assessing PA pressure. Left Atrium: Left  atrial size was normal in size. Right Atrium: Right atrial size was normal in size. Pericardium: There is no evidence of pericardial effusion. Mitral Valve: The mitral valve is grossly normal. No evidence of mitral valve regurgitation. No evidence of mitral valve stenosis. Tricuspid Valve: The tricuspid valve is grossly normal. Tricuspid valve regurgitation is not demonstrated. No evidence of tricuspid stenosis. Aortic Valve: The aortic valve was not well visualized. Aortic valve regurgitation is not visualized. No aortic stenosis is present. Pulmonic Valve: The pulmonic valve was not well visualized. Pulmonic valve regurgitation is not visualized. No evidence of pulmonic stenosis. Aorta: The aortic root and ascending aorta are structurally normal, with no evidence of dilitation. Venous: The inferior vena cava is normal in size with greater than 50% respiratory variability, suggesting right atrial pressure of 3 mmHg. IAS/Shunts: No atrial level shunt detected by color flow Doppler.  LEFT VENTRICLE PLAX 2D LVIDd:         3.70 cm   Diastology LVIDs:         2.70 cm   LV e' medial:    7.29 cm/s LV PW:         1.00 cm   LV E/e' medial:  9.6 LV IVS:        0.90 cm   LV e' lateral:   9.36 cm/s LVOT diam:     2.30 cm   LV E/e' lateral: 7.5 LV SV:         71 LV SV Index:   27 LVOT Area:     4.15 cm  RIGHT VENTRICLE RV S prime:     8.70 cm/s TAPSE (M-mode): 2.0 cm LEFT ATRIUM             Index        RIGHT ATRIUM           Index LA diam:        3.00 cm 1.14 cm/m   RA Area:     13.40 cm LA Vol (A2C):   33.5 ml 12.75 ml/m  RA Volume:   29.10 ml  11.07 ml/m LA Vol (A4C):   41.2 ml 15.68 ml/m LA Biplane Vol: 38.6 ml 14.69 ml/m  AORTIC VALVE LVOT Vmax:   99.50 cm/s LVOT Vmean:  70.700 cm/s LVOT VTI:    0.170 m  AORTA Ao Root diam: 3.20 cm Ao Asc diam:  3.10 cm MITRAL VALVE MV Area (PHT): 3.87 cm    SHUNTS MV Decel Time: 196 msec    Systemic VTI:  0.17 m MV E velocity: 70.00 cm/s  Systemic Diam: 2.30 cm MV A velocity:  62.90 cm/s MV E/A ratio:  1.11 Vishnu Priya Mallipeddi Electronically signed by Winfield Rast Mallipeddi Signature Date/Time: 12/07/2022/4:06:07 PM    Final       LOS: 3 days    Joycelyn Das, MD [facility] Available via Epic secure chat 7am-7pm After these hours, please refer to coverage provider listed on [email or web address] 12/09/2022, 10:34 AM

## 2022-12-09 NOTE — Progress Notes (Signed)
OT Cancellation Note  Patient Details Name: AMARI HAWN MRN: [id number] DOB: [birth year]   Cancelled Treatment:    Reason Eval/Treat Not Completed: Patient at procedure or test/ unavailable  Patient at dialysis when attempted for OT evaluation.  Will re-attempt as able   Denice Paradise 12/09/2022, 3:20 PM

## 2022-12-09 NOTE — Inpatient Diabetes Management (Signed)
Inpatient Diabetes Program Recommendations  AACE/ADA: New Consensus Statement on Inpatient Glycemic Control (2015)  Target Ranges:  Prepandial:   less than 140 mg/dL      Peak postprandial:   less than 180 mg/dL (1-2 hours)      Critically ill patients:  140 - 180 mg/dL   Lab Results  Component Value Date   GLUCAP 67 (L) 12/08/2022   HGBA1C 5.7 (H) 12/06/2022    Review of Glycemic Control  Latest Reference Range & Units 12/08/22 08:10 12/08/22 11:25 12/08/22 15:52 12/08/22 16:50 12/08/22 17:38 12/08/22 19:13  Glucose-Capillary 70 - 99 mg/dL 93 90 65 (L) Novolog 3 units 110 (H) 67 (L)  (L): Data is abnormally low (H): Data is abnormally high  Diabetes history: DM2, ESRD Outpatient Diabetes medications: NONE Current orders for Inpatient glycemic control: Novolog 0-6 units Q4H  Inpatient Diabetes Program Recommendations:    Please consider discontinuing Novolog 0-6 units TID.    Will continue to follow while inpatient.  Thank you, Dulce Sellar, MSN, CDCES Diabetes Coordinator Inpatient Diabetes Program [phone] (team pager from 8a-5p)

## 2022-12-09 NOTE — Procedures (Signed)
Patient was seen on dialysis and the procedure was supervised.  BFR 400  Via TDC BP is  80/52. Chronic hypotension, asymptomatic, lower dialysate temp.  Patient appears to be tolerating treatment well. Check lab in am. D/w HD nurse.  [name] [name] 12/09/2022

## 2022-12-10 ENCOUNTER — Inpatient Hospital Stay (HOSPITAL_COMMUNITY): Payer: Medicare HMO

## 2022-12-10 DIAGNOSIS — I739 Peripheral vascular disease, unspecified: Secondary | ICD-10-CM

## 2022-12-10 DIAGNOSIS — Z992 Dependence on renal dialysis: Secondary | ICD-10-CM | POA: Diagnosis not present

## 2022-12-10 DIAGNOSIS — N186 End stage renal disease: Secondary | ICD-10-CM | POA: Diagnosis not present

## 2022-12-10 DIAGNOSIS — M86171 Other acute osteomyelitis, right ankle and foot: Secondary | ICD-10-CM | POA: Diagnosis not present

## 2022-12-10 LAB — VAS US ABI WITH/WO TBI
Left ABI: 0.96
Right ABI: 1.06

## 2022-12-10 LAB — CBC
HCT: 30.1 % — ABNORMAL LOW (ref 39.0–52.0)
Hemoglobin: 9.6 g/dL — ABNORMAL LOW (ref 13.0–17.0)
MCH: 28.7 pg (ref 26.0–34.0)
MCHC: 31.9 g/dL (ref 30.0–36.0)
MCV: 89.9 fL (ref 80.0–100.0)
Platelets: 231 10*3/uL (ref 150–400)
RBC: 3.35 MIL/uL — ABNORMAL LOW (ref 4.22–5.81)
RDW: 18.4 % — ABNORMAL HIGH (ref 11.5–15.5)
WBC: 13.7 10*3/uL — ABNORMAL HIGH (ref 4.0–10.5)
nRBC: 0 % (ref 0.0–0.2)

## 2022-12-10 LAB — GLUCOSE, CAPILLARY
Glucose-Capillary: 99 mg/dL (ref 70–99)
Glucose-Capillary: 99 mg/dL (ref 70–99)
Glucose-Capillary: 86 mg/dL (ref 70–99)
Glucose-Capillary: 98 mg/dL (ref 70–99)

## 2022-12-10 LAB — BASIC METABOLIC PANEL
Anion gap: 16 — ABNORMAL HIGH (ref 5–15)
BUN: 58 mg/dL — ABNORMAL HIGH (ref 6–20)
CO2: 22 mmol/L (ref 22–32)
Calcium: 9.5 mg/dL (ref 8.9–10.3)
Chloride: 91 mmol/L — ABNORMAL LOW (ref 98–111)
Creatinine, Ser: 13.67 mg/dL — ABNORMAL HIGH (ref 0.61–1.24)
GFR, Estimated: 4 mL/min — ABNORMAL LOW (ref >60)
Glucose, Bld: 88 mg/dL (ref 70–99)
Potassium: 5.1 mmol/L (ref 3.5–5.1)
Sodium: 129 mmol/L — ABNORMAL LOW (ref 135–145)

## 2022-12-10 LAB — MAGNESIUM: Magnesium: 2.1 mg/dL (ref 1.7–2.4)

## 2022-12-10 LAB — SURGICAL PATHOLOGY

## 2022-12-10 LAB — CULTURE, BLOOD (ROUTINE X 2): Culture: NO GROWTH

## 2022-12-10 MED ORDER — DARBEPOETIN ALFA 40 MCG/0.4ML IJ SOSY
40.0000 ug | PREFILLED_SYRINGE | INTRAMUSCULAR | Status: DC
  Filled 2022-12-10: qty 0.4

## 2022-12-10 MED ORDER — DARBEPOETIN ALFA 40 MCG/0.4ML IJ SOSY: 40.0000 ug | PREFILLED_SYRINGE | INTRAMUSCULAR | Status: DC

## 2022-12-10 NOTE — Progress Notes (Signed)
ABI has been completed.    Results can be found under chart review under CV PROC. 12/10/2022 2:21 PM Nahuel Wilbert RVT, RDMS

## 2022-12-10 NOTE — Progress Notes (Signed)
Subjective:  Patient ID: [name], male    DOB: 05/04/1976,  MRN: [id number]  Patient POD#2  s/p right foot Incision and drainage. Patient relates pain has improved but still present when trying to ambulate. Denies n/v/f/c.   Past Medical History:  Diagnosis Date   Acute respiratory failure with hypoxia (HCC) 11/30/2018   Anemia    ESRD   ESRD on hemodialysis (HCC) 06/07/2011   TTS [place]. Started HD in 2006, got transplant in 2014 lasted until Dec 2019 then went back on HD.  Has L thigh AVG as of Jun 2020.  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    History of blood transfusion    Hypertension    03/19/22- has not had high blood pressure in 5 years.   Morbid obesity (HCC)    Paroxysmal atrial fibrillation (HCC)    Pre-diabetes    no meds   Sepsis (HCC) 11/30/2018   Sleep apnea    lost weight, does not use CPAP     Past Surgical History:  Procedure Laterality Date   ARTERIOVENOUS GRAFT PLACEMENT  08/09/2010   Left Thigh Graft by Dr. Cari Caraway   AV FISTULA PLACEMENT Right 05/18/2018   Procedure: INSERTION OF ARTERIOVENOUS (AV) GORE-TEX GRAFT Left THIGH;  Surgeon: Maeola Harman, MD;  Location: [facility] OR;  Service: Vascular;  Laterality: Right;   AV FISTULA PLACEMENT Right 02/15/2021   Procedure: INSERTION OF ARTERIOVENOUS (AV) GORE-TEX LOOP GRAFT RIGHT THIGH;  Surgeon: Maeola Harman, MD;  Location: [facility] OR;  Service: Vascular;  Laterality: Right;   BIOPSY  05/20/2022   Procedure: BIOPSY;  Surgeon: Shellia Cleverly, DO;  Location: [facility];  Service: Gastroenterology;;   CAPD INSERTION N/A 06/13/2022   Procedure: LAPAROSCOPIC INSERTION CONTINUOUS AMBULATORY PERITONEAL DIALYSIS  (CAPD) CATHETER;  Surgeon: Maeola Harman, MD;  Location: [facility] OR;  Service: Vascular;  Laterality: N/A;   CAPD REMOVAL  05/08/2011   Procedure: CONTINUOUS AMBULATORY PERITONEAL DIALYSIS  (CAPD) CATHETER REMOVAL;  Surgeon: Iona Coach, MD;   Location: [facility] OR;  Service: General;  Laterality: N/A;  Removal of CAPD catheter, Dr. requests to go after 100   CAPD REMOVAL N/A 08/08/2022   Procedure: REMOVAL CONTINUOUS AMBULATORY PERITONEAL DIALYSIS  (CAPD) CATHETER;  Surgeon: Maeola Harman, MD;  Location: [facility] OR;  Service: Vascular;  Laterality: N/A;   COLONOSCOPY WITH PROPOFOL N/A 05/20/2022   Procedure: COLONOSCOPY WITH PROPOFOL;  Surgeon: Shellia Cleverly, DO;  Location: [facility];  Service: Gastroenterology;  Laterality: N/A;   I & D EXTREMITY Right 12/08/2022   Procedure: IRRIGATION AND DEBRIDEMENT RIGHT FOOT;  Surgeon: Louann Sjogren, DPM;  Location: [facility] OR;  Service: Orthopedics/Podiatry;  Laterality: Right;   INSERTION OF DIALYSIS CATHETER  10/05/2010   Right Femoral Cath insertion by Dr. Leonides Sake.  Pt ahas had several caths inserted.   INSERTION OF DIALYSIS CATHETER Right 02/15/2021   Procedure: Attempted INSERTION OF RIGHT and Left Internal Jugular DIALYSIS CATHETER, Insertion of Left Femoral Vein Dialysis Catheter;  Surgeon: Maeola Harman, MD;  Location: [facility] OR;  Service: Vascular;  Laterality: Right;   INSERTION OF DIALYSIS CATHETER Right 04/26/2021   Procedure: INSERTION OF TUNNELED 55cm PALIDROME PRECISION CHRONIC DIALYSIS CATHETER;  Surgeon: Leonie Douglas, MD;  Location: [facility] OR;  Service: Vascular;  Laterality: Right;   INSERTION OF DIALYSIS CATHETER Left 12/26/2021   Procedure: INSERTION OF TUNNELED  DIALYSIS CATHETER LEFT FEMORAL ARTERY;  Surgeon: Maeola Harman, MD;  Location: [facility] OR;  Service: Vascular;  Laterality: Left;   INSERTION OF DIALYSIS CATHETER Left 03/06/2022   Procedure: INSERTION OF DIALYSIS CATHETER;  Surgeon: Maeola Harman, MD;  Location: [facility] OR;  Service: Vascular;  Laterality: Left;   IR FLUORO GUIDE CV LINE LEFT  04/23/2022   IR FLUORO GUIDE CV LINE RIGHT  05/11/2018   IR FLUORO GUIDE CV LINE RIGHT  07/03/2021   IR FLUORO GUIDE CV LINE RIGHT  07/16/2021   IR PTA  ADDL CENTRAL DIALYSIS SEG THRU DIALY CIRCUIT RIGHT Right 07/03/2021   IR US GUIDE VASC ACCESS LEFT  04/23/2022   IR US GUIDE VASC ACCESS RIGHT  05/11/2018   IR VENO/EXT/UNI LEFT  04/23/2022   IR VENOCAVAGRAM IVC  07/16/2021   KIDNEY TRANSPLANT  2014   KNEE ARTHROSCOPY Left    LAPAROSCOPIC LYSIS OF ADHESIONS N/A 06/13/2022   Procedure: LAPAROSCOPIC LYSIS OF ADHESIONS;  Surgeon: Maeola Harman, MD;  Location: [facility] OR;  Service: Vascular;  Laterality: N/A;   POLYPECTOMY  05/20/2022   Procedure: POLYPECTOMY;  Surgeon: Shellia Cleverly, DO;  Location: [facility];  Service: Gastroenterology;;   THROMBECTOMY W/ EMBOLECTOMY Right 04/26/2021   Procedure: REMOVAL OF RIGHT THIGH ARTERIOVENOUS GORE-TEX GRAFT AND REPAIR OF RIGHT COMMON FEMORAL ARTERY;  Surgeon: Leonie Douglas, MD;  Location: [facility] OR;  Service: Vascular;  Laterality: Right;   UPPER EXTREMITY ANGIOGRAPHY Bilateral 05/13/2018   Procedure: UPPER EXTREMITY ANGIOGRAPHY - bilarteral;  Surgeon: Cephus Shelling, MD;  Location: [facility] INV[facility] LAB;  Service: Cardiovascular;  Laterality: Bilateral;       Latest Ref Rng & Units 12/10/2022   12:46 AM 12/09/2022    1:27 AM 12/08/2022    4:33 PM  CBC  WBC 4.0 - 10.5 K/uL 13.7  15.0    Hemoglobin 13.0 - 17.0 g/dL 9.6  9.6  60.4   Hematocrit 39.0 - 52.0 % 30.1  30.0  37.0   Platelets 150 - 400 K/uL 231  218         Latest Ref Rng & Units 12/10/2022   12:46 AM 12/09/2022    1:27 AM 12/08/2022    5:44 PM  BMP  Glucose 70 - 99 mg/dL 88  540  981   BUN 6 - 20 mg/dL 58  76  69   Creatinine 0.61 - 1.24 mg/dL 19.14  78.29  56.21   Sodium 135 - 145 mmol/L 129  130  130   Potassium 3.5 - 5.1 mmol/L 5.1  5.5  5.4   Chloride 98 - 111 mmol/L 91  90  91   CO2 22 - 32 mmol/L 22  20  22    Calcium 8.9 - 10.3 mg/dL 9.5  9.5  9.1      Objective:   Vitals:   12/10/22 0315 12/10/22 0742  BP: (!) 86/56 (!) 82/59  Pulse:  61  Resp: 20 10  Temp:  98 F (36.7 C)  SpO2:      General:AA&O x 3.  Normal mood and affect   Vascular: DP and PT pulses 2/4 bilateral. Brisk capillary refill to all digits. Pedal hair present   Neruological. Epicritic sensation grossly intact.   Derm: Surgical wound with scant purulence noted today and mildly improved edema and erythema surrounding plantar wound and dorsal foot. Still very tender.   MSK: MMT 5/5 in dorsiflexion, plantar flexion, inversion and eversion. Normal joint ROM without pain or crepitus.    Vascular:  VAS [place] ABI W/WO TBI 07/25/2022  ABI Findings:  +---------+------------------+-----+---------+--------+  Right   Rt Pressure (mmHg)IndexWaveform Comment   +---------+------------------+-----+---------+--------+  Brachial 94                                        +---------+------------------+-----+---------+--------+  PTA     111               1.13 triphasic          +---------+------------------+-----+---------+--------+  DP      103               1.05 triphasic          +---------+------------------+-----+---------+--------+  Great Toe103               1.05                    +---------+------------------+-----+---------+--------+   +---------+------------------+-----+---------------------------+-------+  Left    Lt Pressure (mmHg)IndexWaveform                   Comment  +---------+------------------+-----+---------------------------+-------+  Brachial 98                                                         +---------+------------------+-----+---------------------------+-------+  PTA     112               1.14 triphasic                           +---------+------------------+-----+---------------------------+-------+  DP      99                1.01 triphasic                           +---------+------------------+-----+---------------------------+-------+  Great Toe                       2nd toe waveform is present          +---------+------------------+-----+---------------------------+-------+   +-------+-----------+--------------------------------+------------+--------  ----+  ABI/TBIToday's ABIToday's TBI                     Previous  ABIPrevious TBI  +-------+-----------+--------------------------------+------------+--------  ----+  Right 1.13       1.05                            1.24        0.66           +-------+-----------+--------------------------------+------------+--------  ----+  Left  1.14       great toe ulcer/2nd toe         1.16        1.16                             waveforms present.                                         +-------+-----------+--------------------------------+------------+--------  ----+  Previous ABI at [facility] on 12/28/20.  Summary:  Right: Resting right ankle-brachial index is within normal range. The right toe-brachial index is normal.  Left: Resting left ankle-brachial index is within normal range. Great toe ulcer. Second toe waveform is present   DG FOOT COMPLETE RIGHT 12/06/2022 FINDINGS: No fracture or dislocation. Normal mineralization. Mild hallux valgus deformity. Small calcaneal spurs. No subcutaneous gas or radiodense foreign body.   IMPRESSION: 1. No acute findings. 2. Mild hallux valgus.   MR FOOT RIGHT WO CONTRAST 12/06/2022 IMPRESSION: 1. Small plantar skin ulcer at the level of the great toe metatarsophalangeal joint with associated soft tissue edema and swelling about the medial greater than lateral aspects of the great toe and medial greater than lateral aspects of the midfoot consistent with cellulitis. There are regions of coalescing fluid medial to the great toe metatarsophalangeal joints and dorsal medial to the length of the proximal phalanx of the great toe suspicious for abscesses. 2. No cortical erosion or marrow edema is seen to indicate MRI evidence of acute osteomyelitis.  Assessment & Plan:   Patient was evaluated and treated and all questions answered.  DX: Right foot abscess and diabetic foot infection Dressing and packing changed today at bedside. There was a scant amount of purulence present on todays exam that was expressed and washed out.  Antibiotics: Continue IV cefepime. Cutlures growing staph aureus in first metatrsal bone. Discussed with patient concern for possible bone infection and options. Discussed partial first ray amputation vs long term antibtioics with patient. Will come by tomorrow to evaluate again and see if any improvement in next 24 hours.   Patient may be heel weight bearing to operative extremity in surgical shoe.  Recommend ID consultation for possible long term antibiotics.  Podiatry to continue to follow   Louann Sjogren, DPM  Accessible via secure chat for questions or concerns.

## 2022-12-10 NOTE — Progress Notes (Signed)
Physical Therapy Treatment Patient Details Name: [name] MRN: [id number] DOB: Sep 20, 1975 Today's Date: 12/10/2022   History of Present Illness 47 y.o. male presented 12/06/22 with worsening of right foot pain, ulcer drainage and fever. Foot cellulitis; MRI rt foot negative for osteomyelitis. S/p I&D RLE on 7/1. 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    PT Comments  Pt supine in bed on arrival.  He seems to be in poor spirits given his situation.  He is understandably worried about his pain with foor in dependent position.  He was able to perform short bout of gt training with darco shoe donned.  Plan for use of his L shoe next session to even out limb length.  Plan for HHPT remains appropriate at this time.        Assistance Recommended at Discharge Intermittent Supervision/Assistance  If plan is discharge home, recommend the following:  Can travel by private vehicle    A little help with walking and/or transfers;A little help with bathing/dressing/bathroom;Assist for transportation;Help with stairs or ramp for entrance      Equipment Recommendations  Rolling walker (2 wheels)    Recommendations for Other Services       Precautions / Restrictions Precautions Precautions: Fall Required Braces or Orthoses: Other Brace Other Brace: darco shoe Restrictions Weight Bearing Restrictions: No Other Position/Activity Restrictions: heel WB in surgical shoe     Mobility  Bed Mobility Overal bed mobility: Modified Independent             General bed mobility comments: Patient able to perform without physical assistance; increased time and HOB elevated    Transfers Overall transfer level: Needs assistance Equipment used: Rolling walker (2 wheels) Transfers: Sit to/from Stand Sit to  Stand: Modified independent (Device/Increase time)           General transfer comment: Pt slightly impulsive rising into standing this session.  He returns to seated position with poor eccentric load.    Ambulation/Gait Ambulation/Gait assistance: Min guard Gait Distance (Feet): 16 Feet Assistive device: Rolling walker (2 wheels) Gait Pattern/deviations: Step-through pattern, Trunk flexed, Antalgic       General Gait Details: Cues for Heel weight bearing.  He has a difficult time keeping his weight rocked back on his heel.  Recommended bringing in a shoe for stability to even out limb length and promote heel weight bearing.  Pt reports pain post session.   Stairs             Wheelchair Mobility     Tilt Bed    Modified Rankin (Stroke Patients Only)       Balance Overall balance assessment: Needs assistance   Sitting balance-Leahy Scale: Good       Standing balance-Leahy Scale: Poor                              Cognition Arousal/Alertness: Lethargic, Suspect due to medications Behavior During Therapy: WFL for tasks assessed/performed Overall Cognitive Status: Within Functional Limits for tasks assessed                                 General Comments: A&Ox4; soft spoken        Exercises      General Comments  Pertinent Vitals/Pain Pain Assessment Pain Assessment: Faces Faces Pain Scale: Hurts even more Pain Location: R foot, reports heel>toe Pain Descriptors / Indicators: Discomfort, Guarding Pain Intervention(s): Monitored during session, Repositioned, Patient requesting pain meds-RN notified    Home Living                          Prior Function            PT Goals (current goals can now be found in the care plan section) Acute Rehab PT Goals Patient Stated Goal: Patient would like to return home with less pain Potential to Achieve Goals: Good Progress towards PT goals: Progressing toward  goals    Frequency    Min 1X/week      PT Plan Current plan remains appropriate    Co-evaluation              AM-PAC PT "6 Clicks" Mobility   Outcome Measure  Help needed turning from your back to your side while in a flat bed without using bedrails?: None Help needed moving from lying on your back to sitting on the side of a flat bed without using bedrails?: None Help needed moving to and from a bed to a chair (including a wheelchair)?: A Little Help needed standing up from a chair using your arms (e.g., wheelchair or bedside chair)?: A Little Help needed to walk in hospital room?: A Little Help needed climbing 3-5 steps with a railing? : A Little 6 Click Score: 20    End of Session Equipment Utilized During Treatment: Gait belt Activity Tolerance: Patient tolerated treatment well Patient left: in bed;with call bell/phone within reach;with nursing/sitter in room Nurse Communication: Mobility status PT Visit Diagnosis: Other abnormalities of gait and mobility (R26.89);Difficulty in walking, not elsewhere classified (R26.2);Pain Pain - Right/Left: Right Pain - part of body: Ankle and joints of foot     Time: 4098-1191 PT Time Calculation (min) (ACUTE ONLY): 17 min  Charges:    $Gait Training: 8-22 mins PT General Charges $$ ACUTE PT VISIT: 1 Visit                     Bonney Leitz , PTA Acute Rehabilitation Services Office [phone]    Florestine Avers 12/10/2022, 3:33 PM

## 2022-12-10 NOTE — Progress Notes (Signed)
   12/10/22 0302  BiPAP/CPAP/SIPAP  BiPAP/CPAP/SIPAP Pt Type Adult  BiPAP/CPAP/SIPAP Resmed  Reason BIPAP/CPAP not in use Non-compliant   Patient refused.

## 2022-12-10 NOTE — Progress Notes (Addendum)
[facility] Progress Note   Subjective:  Seen in room - no CP/dyspnea. Says had terrible foot pain overnight, slept awful.  Objective Vitals:   12/09/22 1619 12/09/22 2309 12/10/22 0315 12/10/22 0742  BP: (!) 91/56  (!) 86/56 (!) 82/59  Pulse: 78   61  Resp: 17  20 10   Temp: 99.2 F (37.3 C) 99.1 F (37.3 C)  98 F (36.7 C)  TempSrc: Oral Oral  Oral  SpO2: 96%   97%  Weight:      Height:       Physical Exam General: Well appearing man, NAD. Room air Heart: RRR; no murmur Lungs: CTA anteriorly Abdomen: soft Extremities: no LE edema, R foot bandaged Dialysis Access: L femoral [facility]  Additional Objective Labs: Basic Metabolic Panel: Recent Labs  Lab 12/07/22 0646 12/08/22 0049 12/08/22 1744 12/09/22 0127 12/10/22 0046  NA 132*   < > 130* 130* 129*  K 4.9   < > 5.4* 5.5* 5.1  CL 92*   < > 91* 90* 91*  CO2 23   < > 22 20* 22  GLUCOSE 125*   < > 108* 106* 88  BUN 49*   < > 69* 76* 58*  CREATININE 14.47*   < > 16.85* 17.52* 13.67*  CALCIUM 9.3   < > 9.1 9.5 9.5  PHOS 6.3*  --   --   --   --    < > = values in this interval not displayed.   Liver Function Tests: Recent Labs  Lab 12/06/22 1053  AST 26  ALT 31  ALKPHOS 58  BILITOT 1.1  PROT 8.5*  ALBUMIN 3.8   CBC: Recent Labs  Lab 12/06/22 1053 12/07/22 0646 12/08/22 1633 12/09/22 0127 12/10/22 0046  WBC 14.4* 16.1*  --  15.0* 13.7*  NEUTROABS 11.3*  --   --   --   --   HGB 12.6* 10.5* 12.6* 9.6* 9.6*  HCT 39.8 32.9* 37.0* 30.0* 30.1*  MCV 92.8 94.5  --  91.7 89.9  PLT 189 182  --  218 231   Blood Culture    Component Value Date/Time   SDES WOUND 12/08/2022 1721   SPECREQUEST FIRST METATARSAL 12/08/2022 1721   CULT FEW METHICILLIN RESISTANT STAPHYLOCOCCUS AUREUS 12/08/2022 1721   REPTSTATUS PENDING 12/08/2022 1721   Studies/Results: DG Foot 2 Views Right  Result Date: 12/08/2022 CLINICAL DATA:  Diabetic foot ulcer EXAM: RIGHT FOOT - 2 VIEW COMPARISON:  MRI 12/06/2022,  radiograph 12/06/2022 potentially due to surgical change if there was intervention here, otherwise gas-forming infection FINDINGS: No fracture or malalignment. Vascular calcifications. Moderate plantar calcaneal spur. Soft tissue edema. Mild hallux valgus deformity first MTP joint. No obvious osseous destructive change. Small gas within the soft tissues at the level of first MTP but also gas collection medial and dorsal aspect at the level of the midfoot. IMPRESSION: 1. No definite acute osseous abnormality. 2. Diffuse soft tissue edema. Small amount of gas at the level of first MTP presumably postsurgical. Additional foci of gas collection medial and dorsal aspect of the midfoot, possible postoperative gas though slightly distant from reported surgical site at first MTP and would correlate with the patient's physical exam. Electronically Signed   By: Jasmine Pang M.D.   On: 12/08/2022 22:06    Medications:  sodium chloride Stopped (12/08/22 1726)   ceFEPime (MAXIPIME) IV 2 g (12/09/22 1610)   vancomycin Stopped (12/09/22 1323)    apixaban  5 mg Oral BID   atorvastatin  40 mg Oral Daily   Chlorhexidine Gluconate Cloth  6 each Topical Daily   doxercalciferol  6 mcg Intravenous Q T,Th,Sa-HD   ferric citrate  630 mg Oral TID WC   insulin aspart  0-6 Units Subcutaneous Q4H   midodrine  15 mg Oral Q8H   mupirocin ointment  1 Application Nasal BID   Assessment/ Plan: Pt is a 47 y.o. yo male with a past medical history significant for ESRD on HD, chronic hypotension, A-fib, CHF admitted with sepsis and a right foot infection.   OP HD: TTS HD  4.5h   500/ 500  140kg   2/2 bath  TDC  Hep 5000+ - hectorol 6 mcg IV three times per week - parsabiv 7.5 mg IV three times per week - no esa   # Sepsis due to right foot infection/possible abscess: Currently on broad-spectrum antibiotics, vancomycin and cefepime. S/p R foot I&D 7/1 -> Cx growing MRSA, abx per primary.  # ESRD TTS: Next HD  tomorrow (7/4) - UF as tolerated.   # Chronic hypotension: SBP typically running around 70-80s per patient, asymptomatic.  Currently on midodrine 15mg  TID.   # Anemia of CKD: Hgb down to 9.6 - will add Aranesp to be given today.   # CKD-MBD: Continue Auryxia, Hectorol. Parsabiv not on formulary here.  Monitor labs.   # Mild hyperkalemia: Improved with HD, monitor.  # A-Fib: On Eliquis  Lily Peer 12/10/2022, 9:52 AM  [facility]  Nephrology attending: I have personally seen and examined the patient.  Chart reviewed.  I agree with the assessment and plan as outlined above. Chronic hypotension, asymptomatic, plan for dialysis tomorrow.  Getting wound care and antibiotics for foot infection. [name], [facility].

## 2022-12-10 NOTE — Care Management Important Message (Signed)
Important Message  Patient Details  Name: [name] MRN: [id number] Date of Birth: May 26, 1976   Medicare Important Message Given:  Yes     [name] 12/10/2022, 8:38 AM

## 2022-12-10 NOTE — TOC CM/SW Note (Signed)
Transition of Care [facility]) - Inpatient Brief Assessment   Patient Details  Name: [name] MRN: [id number] Date of Birth: [birth year]  Transition of Care [facility]) CM/SW Contact:    Darrold Span, RN Phone Number: 12/10/2022, 12:18 PM   Clinical Narrative: Pt s/p right foot Incision and drainage. Per provider note cultures still pending- may need LT abx- pt goes to HD-TTS.  Also noted pt may need to return to OR for further amputation     Transition of Care Asessment: Insurance and Status: Insurance coverage has been reviewed Patient has primary care physician: Yes Home environment has been reviewed: home/mobile home Prior level of function:: self care Prior/Current Home Services: No current home services Social Determinants of Health Reivew: SDOH reviewed no interventions necessary Readmission risk has been reviewed: Yes Transition of care needs: transition of care needs identified, TOC will continue to follow

## 2022-12-10 NOTE — Evaluation (Signed)
Occupational Therapy Evaluation Patient Details Name: [name] MRN: [id number] DOB: [birth year] Today's Date: 12/10/2022   History of Present Illness 47 y.o. male presented 12/06/22 with worsening of right foot pain, ulcer drainage and fever. Foot cellulitis; MRI rt foot negative for osteomyelitis. S/p I&D RLE on 7/1. 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   Clinical Impression   Patient is s/p R LE I&D for ulcer drainage surgery resulting in functional limitations due to the deficits listed below (see OT problem list). Pt at baseline lives alone and indep. Pt with mother recently visiting 2 weeks ago to assist the patient. Pt currently is min guard (A) for stand pivot transfer due to pain in R LE and dependence on RW. Pt keeping R LE nwb with transfers with cues to place heel down.  Patient will benefit from skilled OT acutely to increase independence and safety with ADLS to allow discharge HHOT.       Recommendations for follow up therapy are one component of a multi-disciplinary discharge planning process, led by the attending physician.  Recommendations may be updated based on patient status, additional functional criteria and insurance authorization.   Assistance Recommended at Discharge PRN  Patient can return home with the following Assist for transportation    Functional Status Assessment  Patient has had a recent decline in their functional status and demonstrates the ability to make significant improvements in function in a reasonable and predictable amount of time.  Equipment Recommendations  Other (comment) (RW- pt declines shower seat or BSC during session discussion)    Recommendations for Other Services       Precautions / Restrictions Precautions Precautions: Fall Required  Braces or Orthoses: Other Brace Other Brace: darco shoe Restrictions Weight Bearing Restrictions: Yes Other Position/Activity Restrictions: heel WB in surgical shoe      Mobility Bed Mobility Overal bed mobility: Modified Independent                  Transfers Overall transfer level: Needs assistance Equipment used: Rolling walker (2 wheels) Transfers: Sit to/from Stand Sit to Stand: Modified independent (Device/Increase time), From elevated surface     Step pivot transfers: Min guard     General transfer comment: elevated surface and cues to stay inside the RW with bil LE      Balance Overall balance assessment: Needs assistance   Sitting balance-Leahy Scale: Good     Standing balance support: Bilateral upper extremity supported, During functional activity, Reliant on assistive device for balance Standing balance-Leahy Scale: Poor                             ADL either performed or assessed with clinical judgement   ADL Overall ADL's : Needs assistance/impaired Eating/Feeding: Independent   Grooming: Wash/dry hands;Modified independent;Sitting Grooming Details (indicate cue type and reason): provided wash cloth Upper Body Bathing: Modified independent;Sitting Upper Body Bathing Details (indicate cue type and reason): discussed where pt could sit in his bathroom to do a sponge bath. pt states "i keep an extra chair in my bathroom." pt expressed baseline sponge bathing but never answered direct question about chair with multiple questions asked regarding chair Lower Body Bathing: Min guard;Sit to/from stand       Lower Body Dressing: Min guard;Sit to/from stand Lower Body Dressing Details (  indicate cue type and reason): pt able to hip hike onto the bed to bring LE toward body to don sock and darco shoe. pt educated on strap adjustment for toe area to make shoe fit. Toilet Transfer: Dealer Details (indicate cue type  and reason): simulated oob to chair           General ADL Comments: pt with increased pain immediately with upright posture and transfer to chair. pt stomping the ground with LLE due to discomfort in R foot. pt asking if he is getting up on his RLE too early. pt educated the MD ordered therapy and to weight bear at the heel. pt keeping R LE in NWB     Vision Baseline Vision/History: 1 Wears glasses Ability to See in Adequate Light: 0 Adequate Vision Assessment?: No apparent visual deficits     Perception     Praxis      Pertinent Vitals/Pain       Hand Dominance Right   Extremity/Trunk Assessment Upper Extremity Assessment Upper Extremity Assessment: RUE deficits/detail RUE Deficits / Details: old football injury - shoulders pain with shoulder flexion, pt reports last week unable to raise and improved this week. pt is able to abduct shoulder and externally rotation (pour out cup ) without pain in supine   Lower Extremity Assessment Lower Extremity Assessment: Defer to PT evaluation   Cervical / Trunk Assessment Cervical / Trunk Assessment: Normal   Communication Communication Communication: No difficulties   Cognition Arousal/Alertness: Lethargic, Suspect due to medications Behavior During Therapy: WFL for tasks assessed/performed Overall Cognitive Status: Within Functional Limits for tasks assessed                                 General Comments: A&Ox4; soft spoken     General Comments  dressing on R LE intact and good positioning. pt with darco shoe fit to the patient properly. pt noted to have small skin cut to the L LE great down on medial side with don of sock    Exercises     Shoulder Instructions      Home Living Family/patient expects to be discharged to:: Private residence Living Arrangements: Alone Available Help at Discharge: Family;Friend(s);Available 24 hours/day Type of Home: Mobile home Home Access: Stairs to enter Entrance  Stairs-Number of Steps: 3 Entrance Stairs-Rails: Left Home Layout: One level     Bathroom Shower/Tub: Chief Strategy Officer: Standard Bathroom Accessibility: Yes How Accessible: Accessible via walker;Accessible via wheelchair     Additional Comments: mother came 2 weeks prior and will return. She stays with patient every few months      Prior Functioning/Environment Prior Level of Function : Independent/Modified Independent;Driving                        OT Problem List: Impaired balance (sitting and/or standing);Decreased activity tolerance;Decreased safety awareness;Decreased knowledge of use of DME or AE;Decreased knowledge of precautions;Obesity;Pain      OT Treatment/Interventions: Self-care/ADL training;DME and/or AE instruction;Energy conservation;Therapeutic activities;Patient/family education;Balance training;Therapeutic exercise    OT Goals(Current goals can be found in the care plan section) Acute Rehab OT Goals Patient Stated Goal: to understand why he has pressure that feels like his bladder OT Goal Formulation: With patient Time For Goal Achievement: 12/24/22 Potential to Achieve Goals: Good  OT Frequency: Min 2X/week    Co-evaluation  AM-PAC OT "6 Clicks" Daily Activity     Outcome Measure Help from another person eating meals?: None Help from another person taking care of personal grooming?: None Help from another person toileting, which includes using toliet, bedpan, or urinal?: None Help from another person bathing (including washing, rinsing, drying)?: A Little Help from another person to put on and taking off regular upper body clothing?: None Help from another person to put on and taking off regular lower body clothing?: A Little 6 Click Score: 22   End of Session Equipment Utilized During Treatment: Rolling walker (2 wheels) Nurse Communication: Mobility status;Precautions;Weight bearing status  Activity  Tolerance: Patient tolerated treatment well Patient left: in chair;with call bell/phone within reach  OT Visit Diagnosis: Unsteadiness on feet (R26.81);Muscle weakness (generalized) (M62.81)                Time: 1191-4782 OT Time Calculation (min): 21 min Charges:  OT General Charges $OT Visit: 1 Visit OT Evaluation $OT Eval Moderate Complexity: 1 Mod   Brynn, OTR/L  Acute Rehabilitation Services Office: [phone] .   Mateo Flow 12/10/2022, 9:33 AM

## 2022-12-10 NOTE — Progress Notes (Deleted)
Patient describes feeling lethargic and states he feels his blood sugar is low. Patient blood glucose is 89. Treated NPO patient is amp of D50 for symptomatic low blood sugar level. Will continue to monitor.

## 2022-12-10 NOTE — Progress Notes (Signed)
PROGRESS NOTE    [name]  ZOX:[id number] DOB: 1975-11-02 DOA: 12/06/2022 PCP: [name], MD    Brief Narrative:  47 year old ESRD on hemodialysis TTS with right femoral TDC, chronic Eliquis therapy for paroxysmal A-fib and clot radiographs, sleep apnea not using CPAP presented to the hospital with 2 months of right foot ulcer not healing.  In the emergency room initially with low blood pressures, admitted to ICU, subsequently started on midodrine and transferred out of ICU.  Patient remains in the hospital for surgical intervention and IV antibiotics.   Assessment & Plan:   Severe sepsis from right foot ulcer/abscess and osteomyelitis: Right foot x-ray was negative for osteomyelitis. MRI negative for osteomyelitis but patient has right great toe abscess. Blood cultures on admission Staph epidermidis Incision and drainage of foot abscess on 7/1, surgical cultures with MRSA.  Currently on vancomycin and cefepime. Discontinue cefepime.  Continue vancomycin renally dosed with hemodialysis. Patient will probably need further debridement, surgery to decide. Continue local wound care.  Chronic hypotension: Asymptomatic.  On midodrine 15 mg 3 times daily today.  ESRD on hemodialysis, hyperkalemia: Improved.  Chronic diastolic heart failure: Euvolemic.  OSA not on CPAP: Refusing CPAP at night.  PAF on Eliquis: Normal sinus rhythm.  Not on any rate control medications.   DVT prophylaxis: SCDs Start: 12/06/22 1433 apixaban (ELIQUIS) tablet 5 mg   Code Status: Full code.  Family Communication: None at the bedside Disposition Plan: Status is: Inpatient Remains inpatient appropriate because: Further surgical interventions planned     Consultants:  Podiatry Nephrology  Procedures:  I&D of the right foot  Antimicrobials:  Vancomycin cefepime 7/1   Subjective: Patient seen in the morning rounds.  He does have occasional pain on his right foot but denies any other  complaints.  Objective: Vitals:   12/10/22 1000 12/10/22 1100 12/10/22 1112 12/10/22 1200  BP:   (!) 84/46   Pulse:   86   Resp: 18 15 16  (!) 25  Temp:   99 F (37.2 C)   TempSrc:   Oral   SpO2:   99%   Weight:      Height:        Intake/Output Summary (Last 24 hours) at 12/10/2022 1310 Last data filed at 12/10/2022 0400 Gross per 24 hour  Intake 400.02 ml  Output --  Net 400.02 ml   Filed Weights   12/09/22 0622 12/09/22 0726 12/09/22 1229  Weight: (!) 146.2 kg (!) 145.9 kg (!) 144.4 kg    Examination:  General: Morbidly obese.  Not in any distress.  On room air. Cardiovascular: S1-S2 normal.  Regular rate rate rhythm. Respiratory: Bilateral clear.  No added sounds. Gastrointestinal: Soft.  Nontender. Ext: Right femoral AV graft, hemodialysis catheter left groin. Right foot on dressing, not removed by me. Neuro: Alert awake and oriented. Musculoskeletal: No deformities.     Data Reviewed: I have personally reviewed following labs and imaging studies  CBC: Recent Labs  Lab 12/06/22 1053 12/07/22 0646 12/08/22 1633 12/09/22 0127 12/10/22 0046  WBC 14.4* 16.1*  --  15.0* 13.7*  NEUTROABS 11.3*  --   --   --   --   HGB 12.6* 10.5* 12.6* 9.6* 9.6*  HCT 39.8 32.9* 37.0* 30.0* 30.1*  MCV 92.8 94.5  --  91.7 89.9  PLT 189 182  --  218 231   Basic Metabolic Panel: Recent Labs  Lab 12/07/22 0646 12/08/22 0049 12/08/22 1633 12/08/22 1744 12/09/22 0127 12/10/22 0046  NA  132* 130* 127* 130* 130* 129*  K 4.9 5.5* 6.1* 5.4* 5.5* 5.1  CL 92* 92* 97* 91* 90* 91*  CO2 23 20*  --  22 20* 22  GLUCOSE 125* 99 101* 108* 106* 88  BUN 49* 61* 64* 69* 76* 58*  CREATININE 14.47* 15.72* 16.90* 16.85* 17.52* 13.67*  CALCIUM 9.3 9.2  --  9.1 9.5 9.5  MG 2.2  --   --   --  2.1 2.1  PHOS 6.3*  --   --   --   --   --    GFR: Estimated Creatinine Clearance: 10 mL/min (A) (by C-G formula based on SCr of 13.67 mg/dL (H)). Liver Function Tests: Recent Labs  Lab  12/06/22 1053  AST 26  ALT 31  ALKPHOS 58  BILITOT 1.1  PROT 8.5*  ALBUMIN 3.8   No results for input(s): "LIPASE", "AMYLASE" in the last 168 hours. No results for input(s): "AMMONIA" in the last 168 hours. Coagulation Profile: No results for input(s): "INR", "PROTIME" in the last 168 hours. Cardiac Enzymes: No results for input(s): "CKTOTAL", "CKMB", "CKMBINDEX", "TROPONINI" in the last 168 hours. BNP (last 3 results) No results for input(s): "PROBNP" in the last 8760 hours. HbA1C: No results for input(s): "HGBA1C" in the last 72 hours. CBG: Recent Labs  Lab 12/09/22 1330 12/09/22 1623 12/09/22 2109 12/10/22 0601 12/10/22 1117  GLUCAP 82 112* 94 99 99   Lipid Profile: No results for input(s): "CHOL", "HDL", "LDLCALC", "TRIG", "CHOLHDL", "LDLDIRECT" in the last 72 hours. Thyroid Function Tests: No results for input(s): "TSH", "T4TOTAL", "FREET4", "T3FREE", "THYROIDAB" in the last 72 hours. Anemia Panel: No results for input(s): "VITAMINB12", "FOLATE", "FERRITIN", "TIBC", "IRON", "RETICCTPCT" in the last 72 hours. Sepsis Labs: Recent Labs  Lab 12/06/22 1052 12/06/22 1253 12/08/22 0049  LATICACIDVEN 3.2* 2.7* 1.0    Recent Results (from the past 240 hour(s))  Blood culture (routine x 2)     Status: None (Preliminary result)   Collection Time: 12/06/22 12:20 PM   Specimen: BLOOD LEFT HAND  Result Value Ref Range Status   Specimen Description BLOOD LEFT HAND  Final   Special Requests   Final    BOTTLES DRAWN AEROBIC ONLY Blood Culture results may not be optimal due to an inadequate volume of blood received in culture bottles   Culture   Final    NO GROWTH 4 DAYS Performed at [facility], [address] [place], [place] [address]    Report Status PENDING  Incomplete  Blood culture (routine x 2)     Status: Abnormal   Collection Time: 12/06/22 12:45 PM   Specimen: BLOOD  Result Value Ref Range Status   Specimen Description BLOOD SITE NOT SPECIFIED  Final    Special Requests   Final    BOTTLES DRAWN AEROBIC AND ANAEROBIC Blood Culture results may not be optimal due to an inadequate volume of blood received in culture bottles   Culture  Setup Time   Final    GRAM POSITIVE COCCI AEROBIC BOTTLE ONLY CRITICAL RESULT CALLED TO, READ BACK BY AND VERIFIED WITH: PHARMD LAUREN BELL [id number] AT 1730 BY EC    Culture (A)  Final    STAPHYLOCOCCUS EPIDERMIDIS THE SIGNIFICANCE OF ISOLATING THIS ORGANISM FROM A SINGLE VENIPUNCTURE CANNOT BE PREDICTED WITHOUT FURTHER CLINICAL AND CULTURE CORRELATION. SUSCEPTIBILITIES AVAILABLE ONLY ON REQUEST. Performed at [facility], [address] [place], [place] [address]    Report Status 12/08/2022 FINAL  Final  Blood  Culture ID Panel (Reflexed)     Status: Abnormal   Collection Time: 12/06/22 12:45 PM  Result Value Ref Range Status   Enterococcus faecalis NOT DETECTED NOT DETECTED Final   Enterococcus Faecium NOT DETECTED NOT DETECTED Final   Listeria monocytogenes NOT DETECTED NOT DETECTED Final   Staphylococcus species DETECTED (A) NOT DETECTED Final    Comment: CRITICAL RESULT CALLED TO, READ BACK BY AND VERIFIED WITH: PHARMD LAUREN BELL [id number] AT 1730 BY EC    Staphylococcus aureus (BCID) NOT DETECTED NOT DETECTED Final   Staphylococcus epidermidis DETECTED (A) NOT DETECTED Final    Comment: Methicillin (oxacillin) resistant coagulase negative staphylococcus. Possible blood culture contaminant (unless isolated from more than one blood culture draw or clinical case suggests pathogenicity). No antibiotic treatment is indicated for blood  culture contaminants. REPEAT WITH NEW VIAL OF CTRL PHARMD LAUREN BELL [id number] AT 1730 BY EC    Staphylococcus lugdunensis NOT DETECTED NOT DETECTED Final   Streptococcus species NOT DETECTED NOT DETECTED Final   Streptococcus agalactiae NOT DETECTED NOT DETECTED Final   Streptococcus pneumoniae NOT DETECTED NOT DETECTED Final   Streptococcus pyogenes NOT DETECTED  NOT DETECTED Final   A.calcoaceticus-baumannii NOT DETECTED NOT DETECTED Final   Bacteroides fragilis NOT DETECTED NOT DETECTED Final   Enterobacterales NOT DETECTED NOT DETECTED Final   Enterobacter cloacae complex NOT DETECTED NOT DETECTED Final   Escherichia coli NOT DETECTED NOT DETECTED Final   Klebsiella aerogenes NOT DETECTED NOT DETECTED Final   Klebsiella oxytoca NOT DETECTED NOT DETECTED Final   Klebsiella pneumoniae NOT DETECTED NOT DETECTED Final   Proteus species NOT DETECTED NOT DETECTED Final   Salmonella species NOT DETECTED NOT DETECTED Final   Serratia marcescens NOT DETECTED NOT DETECTED Final   Haemophilus influenzae NOT DETECTED NOT DETECTED Final   Neisseria meningitidis NOT DETECTED NOT DETECTED Final   Pseudomonas aeruginosa NOT DETECTED NOT DETECTED Final   Stenotrophomonas maltophilia NOT DETECTED NOT DETECTED Final   Candida albicans NOT DETECTED NOT DETECTED Final   Candida auris NOT DETECTED NOT DETECTED Final   Candida glabrata NOT DETECTED NOT DETECTED Final   Candida krusei NOT DETECTED NOT DETECTED Final   Candida parapsilosis NOT DETECTED NOT DETECTED Final   Candida tropicalis NOT DETECTED NOT DETECTED Final   Cryptococcus neoformans/gattii NOT DETECTED NOT DETECTED Final   Methicillin resistance mecA/C DETECTED (A) NOT DETECTED Final    Comment: CRITICAL RESULT CALLED TO, READ BACK BY AND VERIFIED WITH: Chestine Spore [id number] AT 1730 BY EC Performed at [facility], [address], [place], [place] [address]   MRSA Next Gen by PCR, Nasal     Status: Abnormal   Collection Time: 12/06/22  4:33 PM   Specimen: Nasal Mucosa; Nasal Swab  Result Value Ref Range Status   MRSA by PCR Next Gen DETECTED (A) NOT DETECTED Final    Comment: RESULT CALLED TO, READ BACK BY AND VERIFIED WITH: 12/06/2022 AT 1836 TO RN Donney Rankins., ADC (NOTE) The GeneXpert MRSA Assay (FDA approved for NASAL specimens only), is one component of a comprehensive MRSA  colonization surveillance program. It is not intended to diagnose MRSA infection nor to guide or monitor treatment for MRSA infections. Test performance is not FDA approved in patients less than 45 years old. Performed at [facility], [address], [place], [place] [address]   Aerobic/Anaerobic Culture w Gram Stain (surgical/deep wound)     Status: None (Preliminary result)   Collection Time: 12/08/22  5:12 PM  Specimen: Path fluid; Tissue  Result Value Ref Range Status   Specimen Description WOUND  Final   Special Requests RIGHT FOOT  Final   Gram Stain   Final    RARE WBC PRESENT, PREDOMINANTLY PMN NO ORGANISMS SEEN    Culture   Final    MODERATE STAPHYLOCOCCUS AUREUS CULTURE REINCUBATED FOR BETTER GROWTH SUSCEPTIBILITIES TO FOLLOW HOLDING FOR POSSIBLE ANAEROBE Performed at [facility], [address] [place], [place] [address]    Report Status PENDING  Incomplete  Aerobic/Anaerobic Culture w Gram Stain (surgical/deep wound)     Status: None (Preliminary result)   Collection Time: 12/08/22  5:21 PM   Specimen: Path fluid; Tissue  Result Value Ref Range Status   Specimen Description WOUND  Final   Special Requests FIRST METATARSAL  Final   Gram Stain NO WBC SEEN NO ORGANISMS SEEN   Final   Culture   Final    FEW METHICILLIN RESISTANT STAPHYLOCOCCUS AUREUS HOLDING FOR POSSIBLE ANAEROBE Performed at [facility], [address] [place], [place] [address]    Report Status PENDING  Incomplete   Organism ID, Bacteria METHICILLIN RESISTANT STAPHYLOCOCCUS AUREUS  Final      Susceptibility   Methicillin resistant staphylococcus aureus - MIC*    CIPROFLOXACIN <=0.5 SENSITIVE Sensitive     ERYTHROMYCIN >=8 RESISTANT Resistant     GENTAMICIN <=0.5 SENSITIVE Sensitive     OXACILLIN >=4 RESISTANT Resistant     TETRACYCLINE <=1 SENSITIVE Sensitive     VANCOMYCIN 1 SENSITIVE Sensitive     TRIMETH/SULFA <=10 SENSITIVE Sensitive     CLINDAMYCIN <=0.25  SENSITIVE Sensitive     RIFAMPIN <=0.5 SENSITIVE Sensitive     Inducible Clindamycin NEGATIVE Sensitive     LINEZOLID 2 SENSITIVE Sensitive     * FEW METHICILLIN RESISTANT STAPHYLOCOCCUS AUREUS         Radiology Studies: DG Foot 2 Views Right  Result Date: 12/08/2022 CLINICAL DATA:  Diabetic foot ulcer EXAM: RIGHT FOOT - 2 VIEW COMPARISON:  MRI 12/06/2022, radiograph 12/06/2022 potentially due to surgical change if there was intervention here, otherwise gas-forming infection FINDINGS: No fracture or malalignment. Vascular calcifications. Moderate plantar calcaneal spur. Soft tissue edema. Mild hallux valgus deformity first MTP joint. No obvious osseous destructive change. Small gas within the soft tissues at the level of first MTP but also gas collection medial and dorsal aspect at the level of the midfoot. IMPRESSION: 1. No definite acute osseous abnormality. 2. Diffuse soft tissue edema. Small amount of gas at the level of first MTP presumably postsurgical. Additional foci of gas collection medial and dorsal aspect of the midfoot, possible postoperative gas though slightly distant from reported surgical site at first MTP and would correlate with the patient's physical exam. Electronically Signed   By: Jasmine Pang M.D.   On: 12/08/2022 22:06        Scheduled Meds:  apixaban  5 mg Oral BID   atorvastatin  40 mg Oral Daily   Chlorhexidine Gluconate Cloth  6 each Topical Daily   [START ON 12/11/2022] darbepoetin (ARANESP) injection - DIALYSIS  40 mcg Subcutaneous Q Thu-1800   doxercalciferol  6 mcg Intravenous Q T,Th,Sa-HD   ferric citrate  630 mg Oral TID WC   insulin aspart  0-6 Units Subcutaneous Q4H   midodrine  15 mg Oral Q8H   mupirocin ointment  1 Application Nasal BID   Continuous Infusions:  sodium chloride Stopped (12/08/22 1726)   vancomycin Stopped (12/09/22 1323)  LOS: 4 days    Time spent: 35 minutes    [name], MD [facility]

## 2022-12-10 NOTE — Progress Notes (Signed)
Pharmacy Antibiotic Note  [name] is a 47 y.o. male admitted on 12/06/2022 with RLE abscess and DFI s/p I&D. Pt has ESRD and is on HD TTS.  Pharmacy has been consulted for Cefepime and Vancomycin dosing.   Wound culture growing MRSA and possible anaerobe. Considering amputation. Consider stopping cefepime.   Plan: Cefepime 2g post HD TTS Vancomycin 1500mg  Post HD TTS Monitor cultures, clinical status, HD schedule,vancomycin level if continuing Narrow abx as able and f/u duration    Height: 6\' 2"  (188 cm) Weight: (!) 144.4 kg (318 lb 5.5 oz) IBW/kg (Calculated) : 82.2  Temp (24hrs), Avg:99 F (37.2 C), Min:98 F (36.7 C), Max:99.6 F (37.6 C)  Recent Labs  Lab 12/06/22 1052 12/06/22 1053 12/06/22 1053 12/06/22 1253 12/07/22 0646 12/08/22 0049 12/08/22 1633 12/08/22 1744 12/09/22 0127 12/10/22 0046  WBC  --  14.4*  --   --  16.1*  --   --   --  15.0* 13.7*  CREATININE  --  12.51*   < >  --  14.47* 15.72* 16.90* 16.85* 17.52* 13.67*  LATICACIDVEN 3.2*  --   --  2.7*  --  1.0  --   --   --   --    < > = values in this interval not displayed.     Estimated Creatinine Clearance: 10 mL/min (A) (by C-G formula based on SCr of 13.67 mg/dL (H)).    No Known Allergies  Antimicrobials this admission: Cefepime 6/29 >>  Vancomycin 6/29 >>   Microbiology results: 6/29 BCx: 1/2 staph epi- likely contaminant  7/1 OR wound cx + MRSA, possible anaerobe   Thank you for allowing pharmacy to be a part of this patient's care. Alphia Moh, PharmD, BCPS, BCCP Clinical Pharmacist  Please check AMION for all [facility] Pharmacy phone numbers After 10:00 PM, call Main Pharmacy [phone]

## 2022-12-11 DIAGNOSIS — I509 Heart failure, unspecified: Secondary | ICD-10-CM

## 2022-12-11 DIAGNOSIS — Z992 Dependence on renal dialysis: Secondary | ICD-10-CM | POA: Diagnosis not present

## 2022-12-11 DIAGNOSIS — I272 Pulmonary hypertension, unspecified: Secondary | ICD-10-CM

## 2022-12-11 DIAGNOSIS — Z7901 Long term (current) use of anticoagulants: Secondary | ICD-10-CM

## 2022-12-11 DIAGNOSIS — L02619 Cutaneous abscess of unspecified foot: Secondary | ICD-10-CM

## 2022-12-11 DIAGNOSIS — N186 End stage renal disease: Secondary | ICD-10-CM | POA: Diagnosis not present

## 2022-12-11 DIAGNOSIS — M86171 Other acute osteomyelitis, right ankle and foot: Secondary | ICD-10-CM | POA: Diagnosis not present

## 2022-12-11 DIAGNOSIS — I480 Paroxysmal atrial fibrillation: Secondary | ICD-10-CM

## 2022-12-11 LAB — CBC
HCT: 29.3 % — ABNORMAL LOW (ref 39.0–52.0)
Hemoglobin: 9.4 g/dL — ABNORMAL LOW (ref 13.0–17.0)
MCH: 29.6 pg (ref 26.0–34.0)
MCHC: 32.1 g/dL (ref 30.0–36.0)
MCV: 92.1 fL (ref 80.0–100.0)
Platelets: 295 10*3/uL (ref 150–400)
RBC: 3.18 MIL/uL — ABNORMAL LOW (ref 4.22–5.81)
RDW: 18 % — ABNORMAL HIGH (ref 11.5–15.5)
WBC: 13.9 10*3/uL — ABNORMAL HIGH (ref 4.0–10.5)
nRBC: 0 % (ref 0.0–0.2)

## 2022-12-11 LAB — RENAL FUNCTION PANEL
Albumin: 2.5 g/dL — ABNORMAL LOW (ref 3.5–5.0)
Anion gap: 18 — ABNORMAL HIGH (ref 5–15)
BUN: 80 mg/dL — ABNORMAL HIGH (ref 6–20)
CO2: 22 mmol/L (ref 22–32)
Calcium: 9.8 mg/dL (ref 8.9–10.3)
Chloride: 91 mmol/L — ABNORMAL LOW (ref 98–111)
Creatinine, Ser: 15.98 mg/dL — ABNORMAL HIGH (ref 0.61–1.24)
GFR, Estimated: 3 mL/min — ABNORMAL LOW (ref >60)
Glucose, Bld: 103 mg/dL — ABNORMAL HIGH (ref 70–99)
Phosphorus: 7.4 mg/dL — ABNORMAL HIGH (ref 2.5–4.6)
Potassium: 5.2 mmol/L — ABNORMAL HIGH (ref 3.5–5.1)
Sodium: 131 mmol/L — ABNORMAL LOW (ref 135–145)

## 2022-12-11 LAB — CULTURE, BLOOD (ROUTINE X 2)

## 2022-12-11 LAB — AEROBIC/ANAEROBIC CULTURE W GRAM STAIN (SURGICAL/DEEP WOUND): Gram Stain: NONE SEEN

## 2022-12-11 LAB — GLUCOSE, CAPILLARY
Glucose-Capillary: 76 mg/dL (ref 70–99)
Glucose-Capillary: 87 mg/dL (ref 70–99)
Glucose-Capillary: 91 mg/dL (ref 70–99)

## 2022-12-11 MED ORDER — OXYBUTYNIN CHLORIDE 5 MG PO TABS
5.0000 mg | ORAL_TABLET | Freq: Three times a day (TID) | ORAL | Status: DC
  Administered 2022-12-11 – 2022-12-16 (×14): 5 mg via ORAL
  Filled 2022-12-11 (×18): qty 1

## 2022-12-11 MED ORDER — METRONIDAZOLE 500 MG PO TABS
500.0000 mg | ORAL_TABLET | Freq: Two times a day (BID) | ORAL | Status: DC
  Administered 2022-12-11 – 2022-12-16 (×11): 500 mg via ORAL
  Filled 2022-12-11 (×12): qty 1

## 2022-12-11 MED ORDER — HEPARIN SODIUM (PORCINE) 5000 UNIT/ML IJ SOLN
5000.0000 [IU] | Freq: Three times a day (TID) | INTRAMUSCULAR | Status: AC
  Filled 2022-12-11 (×2): qty 1

## 2022-12-11 MED ORDER — DARBEPOETIN ALFA 60 MCG/0.3ML IJ SOSY
60.0000 ug | PREFILLED_SYRINGE | INTRAMUSCULAR | Status: DC
  Filled 2022-12-11: qty 0.3

## 2022-12-11 MED ORDER — HEPARIN SODIUM (PORCINE) 1000 UNIT/ML IJ SOLN
INTRAMUSCULAR | Status: AC
  Administered 2022-12-11: 4600 [IU]
  Filled 2022-12-11: qty 11

## 2022-12-11 MED ORDER — HEPARIN SODIUM (PORCINE) 1000 UNIT/ML DIALYSIS: 7000.0000 [IU] | Freq: Once | INTRAMUSCULAR | Status: DC

## 2022-12-11 NOTE — Progress Notes (Signed)
   12/10/22 2319  BiPAP/CPAP/SIPAP  Reason BIPAP/CPAP not in use Non-compliant  BiPAP/CPAP /SiPAP Vitals  Temp 98.4 F (36.9 C)  Pulse Rate 83  Resp 19  BP (!) 96/54  SpO2 99 %  MEWS Score/Color  MEWS Score 1  MEWS Score Color Green   Resmed at bedside

## 2022-12-11 NOTE — Procedures (Signed)
Patient was seen on dialysis and the procedure was supervised.  BFR 400  Via TDC BP is  88/59. Chronic hypotension, asymptomatic.   Patient appears to be tolerating treatment well.  [name] [name] 12/11/2022

## 2022-12-11 NOTE — Progress Notes (Signed)
   12/11/22 2048  BiPAP/CPAP/SIPAP  Reason BIPAP/CPAP not in use Non-compliant

## 2022-12-11 NOTE — Progress Notes (Signed)
[name] Progress Note   Subjective:  Seen on HD - 4L UFG and tolerating. Initially had problems getting femoral TDC to run - now able to get it going -> seems to be very positional. + foot pain, no CP/dyspnea. Per note - going for 1st ray amputation tomorrow.  Objective Vitals:   12/11/22 0329 12/11/22 0823 12/11/22 0913 12/11/22 1000  BP: (!) 91/47 (!) 79/56 (!) 86/57 (!) 89/54  Pulse: 63 63 60 (!) 54  Resp: 13 16 18  (!) 80  Temp: 98 F (36.7 C) 98.5 F (36.9 C) 97.8 F (36.6 C)   TempSrc: Oral Oral    SpO2: 99% 100% 99% 94%  Weight: (!) 143.7 kg  (!) 145.8 kg   Height:       Physical Exam General: Well appearing man, NAD. Room air Heart: RRR; no murmur Lungs: CTA anteriorly Abdomen: soft Extremities: no LE edema, R foot bandaged Dialysis Access: L femoral [facility]  Additional Objective Labs: Basic Metabolic Panel: Recent Labs  Lab 12/07/22 0646 12/08/22 0049 12/09/22 0127 12/10/22 0046 12/11/22 0915  NA 132*   < > 130* 129* 131*  K 4.9   < > 5.5* 5.1 5.2*  CL 92*   < > 90* 91* 91*  CO2 23   < > 20* 22 22  GLUCOSE 125*   < > 106* 88 103*  BUN 49*   < > 76* 58* 80*  CREATININE 14.47*   < > 17.52* 13.67* 15.98*  CALCIUM 9.3   < > 9.5 9.5 9.8  PHOS 6.3*  --   --   --  7.4*   < > = values in this interval not displayed.   Liver Function Tests: Recent Labs  Lab 12/06/22 1053 12/11/22 0915  AST 26  --   ALT 31  --   ALKPHOS 58  --   BILITOT 1.1  --   PROT 8.5*  --   ALBUMIN 3.8 2.5*   CBC: Recent Labs  Lab 12/06/22 1053 12/07/22 0646 12/08/22 1633 12/09/22 0127 12/10/22 0046 12/11/22 0915  WBC 14.4* 16.1*  --  15.0* 13.7* 13.9*  NEUTROABS 11.3*  --   --   --   --   --   HGB 12.6* 10.5*   < > 9.6* 9.6* 9.4*  HCT 39.8 32.9*   < > 30.0* 30.1* 29.3*  MCV 92.8 94.5  --  91.7 89.9 92.1  PLT 189 182  --  218 231 295   < > = values in this interval not displayed.   Blood Culture    Component Value Date/Time   SDES WOUND 12/08/2022  1721   SPECREQUEST FIRST METATARSAL 12/08/2022 1721   CULT  12/08/2022 1721    FEW METHICILLIN RESISTANT STAPHYLOCOCCUS AUREUS HOLDING FOR POSSIBLE ANAEROBE Performed at [facility], [address] [place], [place] [address]    REPTSTATUS PENDING 12/08/2022 1721   Studies/Results: VAS [place] ABI WITH/WO TBI  Result Date: 12/10/2022  LOWER EXTREMITY DOPPLER STUDY Patient Name:  [name]  Date of Exam:   12/10/2022 Medical Rec #: [id number]          Accession #:    [id number] Date of Birth: 03/04/1976          Patient Gender: M Patient Age:   47 years Exam Location:  [facility] Procedure:      VAS [place] ABI WITH/WO TBI Referring Phys: REBECCA SIKORA --------------------------------------------------------------------------------  Indications: Cellultis/RT foot infection, LT great toe ulceration  High Risk Factors: Hypertension, hyperlipidemia, past history of smoking. Other Factors: ESRD (HD- LLE), CHF, Afib.  Limitations: Today's exam was limited due to bandages and skin texture              (thickened, hard), venous interference. Comparison Study: Previous exam on 07/15/2022 was WNL Performing Technologist: Ernestene Mention RVT, RDMS  Examination Guidelines: A complete evaluation includes at minimum, Doppler waveform signals and systolic blood pressure reading at the level of bilateral brachial, anterior tibial, and posterior tibial arteries, when vessel segments are accessible. Bilateral testing is considered an integral part of a complete examination. Photoelectric Plethysmograph (PPG) waveforms and toe systolic pressure readings are included as required and additional duplex testing as needed. Limited examinations for reoccurring indications may be performed as noted.  ABI Findings: +--------+------------------+-----+-----------+-----------------+ Right   Rt Pressure (mmHg)IndexWaveform   Comment           +--------+------------------+-----+-----------+-----------------+ Brachial105                     triphasic                    +--------+------------------+-----+-----------+-----------------+ PTA     111               1.06 multiphasic                  +--------+------------------+-----+-----------+-----------------+ DP      104               0.99 triphasic  audibly triphasic +--------+------------------+-----+-----------+-----------------+ +---------+------------------+-----+---------+-----------------+ Left     Lt Pressure (mmHg)IndexWaveform Comment           +---------+------------------+-----+---------+-----------------+ Brachial 84                     triphasic                  +---------+------------------+-----+---------+-----------------+ PTA      101               0.96 triphasic                  +---------+------------------+-----+---------+-----------------+ DP       97                0.92 triphasicaudibly triphasic +---------+------------------+-----+---------+-----------------+ Great Toe120               1.14 Normal                     +---------+------------------+-----+---------+-----------------+ +-------+-----------+-----------+------------+------------+ ABI/TBIToday's ABIToday's TBIPrevious ABIPrevious TBI +-------+-----------+-----------+------------+------------+ Right  1.06       bandaged   1.18                     +-------+-----------+-----------+------------+------------+ Left   0.96       1.14       0.99                     +-------+-----------+-----------+------------+------------+  Very difficult obtaining doppler signals due to venous interference.  Summary: Right: Resting right ankle-brachial index is within normal range. Unable to obtain TBI due to wound/bandage. Left: Resting left ankle-brachial index is within normal range. The left toe-brachial index is normal. Great toe too large for cuff, second toe used. *See table(s) above for measurements and observations.  Electronically signed by Coral Else MD  on 12/10/2022 at 9:19:21 PM.    Final     Medications:  sodium chloride Stopped (  12/08/22 1726)   vancomycin Stopped (12/09/22 1323)    atorvastatin  40 mg Oral Daily   Chlorhexidine Gluconate Cloth  6 each Topical Daily   darbepoetin (ARANESP) injection - DIALYSIS  40 mcg Subcutaneous Q Thu-1800   doxercalciferol  6 mcg Intravenous Q T,Th,Sa-HD   ferric citrate  630 mg Oral TID WC   heparin  5,000 Units Subcutaneous Q8H   heparin  7,000 Units Dialysis Once in dialysis   heparin sodium (porcine)       insulin aspart  0-6 Units Subcutaneous Q4H   midodrine  15 mg Oral Q8H   mupirocin ointment  1 Application Nasal BID   Assessment/ Plan: Pt is a 47 y.o. yo male with a past medical history significant for ESRD on HD, chronic hypotension, A-fib, CHF admitted with sepsis and a right foot infection.   OP HD: TTS HD  4.5h   500/ 500  140kg   2/2 bath  TDC  Hep 5000+ - hectorol 6 mcg IV three times per week - parsabiv 7.5 mg IV three times per week - no esa   # Sepsis due to right foot infection/possible abscess: Started on Vanc/Cefepime -> now Vanc alone. S/p R foot I&D 7/1 -> Cx growing MRSA. For 1st ray amputation 7/5, per notes.   # ESRD TTS: HD now, 4L UFG.   # Chronic hypotension: SBP runs 70-80 range per patient, asymptomatic. Currently on midodrine 15mg  TID.   # Anemia of CKD: Hgb 9.4 - Aranesp ordered to start today (and then weekly).   # CKD-MBD: Continue Auryxia, Hectorol. Parsabiv not on formulary here.  Monitor labs.   # Mild hyperkalemia: Improved with HD, monitor.   # A-Fib: On Eliquis  Lily Peer 12/11/2022, 10:24 AM  [employer]

## 2022-12-11 NOTE — Progress Notes (Signed)
Patient received on 4E from dialysis.    12/11/22 1459  Vitals  Temp 97.8 F (36.6 C)  Temp Source Oral  BP (!) 81/56  MAP (mmHg) 65  BP Location Right Arm  BP Method Automatic  Patient Position (if appropriate) Lying  Pulse Rate 89  Pulse Rate Source Monitor  ECG Heart Rate 89  Resp 14  Level of Consciousness  Level of Consciousness Alert  MEWS COLOR  MEWS Score Color Green  Oxygen Therapy  SpO2 100 %  O2 Device Room Air  MEWS Score  MEWS Temp 0  MEWS Systolic 1  MEWS Pulse 0  MEWS RR 0  MEWS LOC 0  MEWS Score 1

## 2022-12-11 NOTE — Progress Notes (Signed)
Subjective:  Patient ID: [name], male    DOB: 1976-02-23,  MRN: [id number]  Patient POD#3  s/p right foot Incision and drainage. Patient relates pain still and maybe worse. Patient is nervous and frustrated Denies n/v/f/c.   Past Medical History:  Diagnosis Date   Acute respiratory failure with hypoxia (HCC) 11/30/2018   Anemia    ESRD   ESRD on hemodialysis (HCC) 06/07/2011   TTS [place] [facility]. Started HD in 2006, got transplant in 2014 lasted until Dec 2019 then went back on HD.  Has L thigh AVG as of Jun 2020.  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    History of blood transfusion    Hypertension    03/19/22- has not had high blood pressure in 5 years.   Morbid obesity (HCC)    Paroxysmal atrial fibrillation (HCC)    Pre-diabetes    no meds   Sepsis (HCC) 11/30/2018   Sleep apnea    lost weight, does not use CPAP     Past Surgical History:  Procedure Laterality Date   ARTERIOVENOUS GRAFT PLACEMENT  08/09/2010   Left Thigh Graft by Dr. Cari Caraway   AV FISTULA PLACEMENT Right 05/18/2018   Procedure: INSERTION OF ARTERIOVENOUS (AV) GORE-TEX GRAFT Left THIGH;  Surgeon: Maeola Harman, MD;  Location: [facility] OR;  Service: Vascular;  Laterality: Right;   AV FISTULA PLACEMENT Right 02/15/2021   Procedure: INSERTION OF ARTERIOVENOUS (AV) GORE-TEX LOOP GRAFT RIGHT THIGH;  Surgeon: Maeola Harman, MD;  Location: [facility] OR;  Service: Vascular;  Laterality: Right;   BIOPSY  05/20/2022   Procedure: BIOPSY;  Surgeon: Shellia Cleverly, DO;  Location: [facility];  Service: Gastroenterology;;   CAPD INSERTION N/A 06/13/2022   Procedure: LAPAROSCOPIC INSERTION CONTINUOUS AMBULATORY PERITONEAL DIALYSIS  (CAPD) CATHETER;  Surgeon: Maeola Harman, MD;  Location: [facility] OR;  Service: Vascular;  Laterality: N/A;   CAPD REMOVAL  05/08/2011   Procedure: CONTINUOUS AMBULATORY PERITONEAL DIALYSIS  (CAPD) CATHETER REMOVAL;  Surgeon: Iona Coach, MD;   Location: [facility] OR;  Service: General;  Laterality: N/A;  Removal of CAPD catheter, Dr. requests to go after 100   CAPD REMOVAL N/A 08/08/2022   Procedure: REMOVAL CONTINUOUS AMBULATORY PERITONEAL DIALYSIS  (CAPD) CATHETER;  Surgeon: Maeola Harman, MD;  Location: [facility] OR;  Service: Vascular;  Laterality: N/A;   COLONOSCOPY WITH PROPOFOL N/A 05/20/2022   Procedure: COLONOSCOPY WITH PROPOFOL;  Surgeon: Shellia Cleverly, DO;  Location: [facility];  Service: Gastroenterology;  Laterality: N/A;   I & D EXTREMITY Right 12/08/2022   Procedure: IRRIGATION AND DEBRIDEMENT RIGHT FOOT;  Surgeon: Louann Sjogren, DPM;  Location: [facility] OR;  Service: Orthopedics/Podiatry;  Laterality: Right;   INSERTION OF DIALYSIS CATHETER  10/05/2010   Right Femoral Cath insertion by Dr. Leonides Sake.  Pt ahas had several caths inserted.   INSERTION OF DIALYSIS CATHETER Right 02/15/2021   Procedure: Attempted INSERTION OF RIGHT and Left Internal Jugular DIALYSIS CATHETER, Insertion of Left Femoral Vein Dialysis Catheter;  Surgeon: Maeola Harman, MD;  Location: [facility] OR;  Service: Vascular;  Laterality: Right;   INSERTION OF DIALYSIS CATHETER Right 04/26/2021   Procedure: INSERTION OF TUNNELED 55cm PALIDROME PRECISION CHRONIC DIALYSIS CATHETER;  Surgeon: Leonie Douglas, MD;  Location: [facility] OR;  Service: Vascular;  Laterality: Right;   INSERTION OF DIALYSIS CATHETER Left 12/26/2021   Procedure: INSERTION OF TUNNELED  DIALYSIS CATHETER LEFT FEMORAL ARTERY;  Surgeon: Maeola Harman, MD;  Location: [facility] OR;  Service: Vascular;  Laterality: Left;   INSERTION OF DIALYSIS CATHETER Left 03/06/2022   Procedure: INSERTION OF DIALYSIS CATHETER;  Surgeon: Maeola Harman, MD;  Location: [facility] OR;  Service: Vascular;  Laterality: Left;   IR FLUORO GUIDE CV LINE LEFT  04/23/2022   IR FLUORO GUIDE CV LINE RIGHT  05/11/2018   IR FLUORO GUIDE CV LINE RIGHT  07/03/2021   IR FLUORO GUIDE CV LINE RIGHT  07/16/2021   IR PTA  ADDL CENTRAL DIALYSIS SEG THRU DIALY CIRCUIT RIGHT Right 07/03/2021   IR US GUIDE VASC ACCESS LEFT  04/23/2022   IR US GUIDE VASC ACCESS RIGHT  05/11/2018   IR VENO/EXT/UNI LEFT  04/23/2022   IR VENOCAVAGRAM IVC  07/16/2021   KIDNEY TRANSPLANT  2014   KNEE ARTHROSCOPY Left    LAPAROSCOPIC LYSIS OF ADHESIONS N/A 06/13/2022   Procedure: LAPAROSCOPIC LYSIS OF ADHESIONS;  Surgeon: Maeola Harman, MD;  Location: [facility] OR;  Service: Vascular;  Laterality: N/A;   POLYPECTOMY  05/20/2022   Procedure: POLYPECTOMY;  Surgeon: Shellia Cleverly, DO;  Location: [facility];  Service: Gastroenterology;;   THROMBECTOMY W/ EMBOLECTOMY Right 04/26/2021   Procedure: REMOVAL OF RIGHT THIGH ARTERIOVENOUS GORE-TEX GRAFT AND REPAIR OF RIGHT COMMON FEMORAL ARTERY;  Surgeon: Leonie Douglas, MD;  Location: [facility] OR;  Service: Vascular;  Laterality: Right;   UPPER EXTREMITY ANGIOGRAPHY Bilateral 05/13/2018   Procedure: UPPER EXTREMITY ANGIOGRAPHY - bilarteral;  Surgeon: Cephus Shelling, MD;  Location: [facility] INV[facility] LAB;  Service: Cardiovascular;  Laterality: Bilateral;       Latest Ref Rng & Units 12/10/2022   12:46 AM 12/09/2022    1:27 AM 12/08/2022    4:33 PM  CBC  WBC 4.0 - 10.5 K/uL 13.7  15.0    Hemoglobin 13.0 - 17.0 g/dL 9.6  9.6  16.1   Hematocrit 39.0 - 52.0 % 30.1  30.0  37.0   Platelets 150 - 400 K/uL 231  218         Latest Ref Rng & Units 12/10/2022   12:46 AM 12/09/2022    1:27 AM 12/08/2022    5:44 PM  BMP  Glucose 70 - 99 mg/dL 88  096  045   BUN 6 - 20 mg/dL 58  76  69   Creatinine 0.61 - 1.24 mg/dL 40.98  11.91  47.82   Sodium 135 - 145 mmol/L 129  130  130   Potassium 3.5 - 5.1 mmol/L 5.1  5.5  5.4   Chloride 98 - 111 mmol/L 91  90  91   CO2 22 - 32 mmol/L 22  20  22    Calcium 8.9 - 10.3 mg/dL 9.5  9.5  9.1      Objective:   Vitals:   12/11/22 0329 12/11/22 0823  BP: (!) 91/47 (!) 79/56  Pulse: 63 63  Resp: 13 16  Temp: 98 F (36.7 C) 98.5 F (36.9 C)  SpO2: 99% 100%     General:AA&O x 3. Normal mood and affect   Vascular: DP and PT pulses 2/4 bilateral. Brisk capillary refill to all digits. Pedal hair present   Neruological. Epicritic sensation grossly intact.   Derm: Surgical wound with purulence noted today and  edema and erythema surrounding plantar wound and dorsal foot. Still very tender.   MSK: MMT 5/5 in dorsiflexion, plantar flexion, inversion and eversion. Normal joint ROM without pain or crepitus.    Vascular:  VAS [place] ABI W/WO TBI 07/25/2022  ABI Findings:  +---------+------------------+-----+---------+--------+  Right   Rt Pressure (mmHg)IndexWaveform Comment   +---------+------------------+-----+---------+--------+  Brachial 94                                        +---------+------------------+-----+---------+--------+  PTA     111               1.13 triphasic          +---------+------------------+-----+---------+--------+  DP      103               1.05 triphasic          +---------+------------------+-----+---------+--------+  Great Toe103               1.05                    +---------+------------------+-----+---------+--------+   +---------+------------------+-----+---------------------------+-------+  Left    Lt Pressure (mmHg)IndexWaveform                   Comment  +---------+------------------+-----+---------------------------+-------+  Brachial 98                                                         +---------+------------------+-----+---------------------------+-------+  PTA     112               1.14 triphasic                           +---------+------------------+-----+---------------------------+-------+  DP      99                1.01 triphasic                           +---------+------------------+-----+---------------------------+-------+  Great Toe                       2nd toe waveform is present          +---------+------------------+-----+---------------------------+-------+   +-------+-----------+--------------------------------+------------+--------  ----+  ABI/TBIToday's ABIToday's TBI                     Previous  ABIPrevious TBI  +-------+-----------+--------------------------------+------------+--------  ----+  Right 1.13       1.05                            1.24        0.66           +-------+-----------+--------------------------------+------------+--------  ----+  Left  1.14       great toe ulcer/2nd toe         1.16        1.16                             waveforms present.                                         +-------+-----------+--------------------------------+------------+--------  ----+  Previous ABI at [facility] on 12/28/20.  Summary:  Right: Resting right ankle-brachial index is within normal range. The right toe-brachial index is normal.  Left: Resting left ankle-brachial index is within normal range. Great toe ulcer. Second toe waveform is present   DG FOOT COMPLETE RIGHT 12/06/2022 FINDINGS: No fracture or dislocation. Normal mineralization. Mild hallux valgus deformity. Small calcaneal spurs. No subcutaneous gas or radiodense foreign body.   IMPRESSION: 1. No acute findings. 2. Mild hallux valgus.   MR FOOT RIGHT WO CONTRAST 12/06/2022 IMPRESSION: 1. Small plantar skin ulcer at the level of the great toe metatarsophalangeal joint with associated soft tissue edema and swelling about the medial greater than lateral aspects of the great toe and medial greater than lateral aspects of the midfoot consistent with cellulitis. There are regions of coalescing fluid medial to the great toe metatarsophalangeal joints and dorsal medial to the length of the proximal phalanx of the great toe suspicious for abscesses. 2. No cortical erosion or marrow edema is seen to indicate MRI evidence of acute osteomyelitis.  Assessment & Plan:   Patient was evaluated and treated and all questions answered.  DX: Right foot abscess and diabetic foot infection Dressing and packing changed today at bedside. There was a scant amount of purulence present on todays exam that was expressed and washed out.  Antibiotics: Continue IV cefepime. Cutlures growing staph aureus in first metatrsal bone and pathology positive for osteomyelitis . Discussed with patient concern for bone infection and foot has not improved overnight so will plan for amputation of the right first ray tomorrow.  NPO after midnight.  Patient may be heel weight bearing to operative extremity in surgical shoe.  Patient in agreement with plan  Podiatry to continue to follow   Louann Sjogren, DPM  Accessible via secure chat for questions or concerns.

## 2022-12-11 NOTE — Consult Note (Signed)
Date of Admission:  12/06/2022          Reason for Consult: Osteomyelitis    Referring Provider: [name], MD   Assessment:  Foot abscess with concern for osteomyelitis with MRSA and bacteroides isolated ESRD on HD PAF on anticoagulation CHF Pulmonary HTN   Plan:  I will add metronidazole to vancomycin I think a regimen of 4 weeks of IV vancomycin with HD at dialysis center and BID metronidazole is reasonable Check ESR, CRP  Active Problems:   Essential hypertension   ESRD on dialysis [facility])   Acute osteomyelitis of right foot (HCC)   Sepsis (HCC)   Ulcer of right foot (HCC)   Cellulitis and abscess of toe of right foot   Scheduled Meds:  atorvastatin  40 mg Oral Daily   Chlorhexidine Gluconate Cloth  6 each Topical Daily   darbepoetin (ARANESP) injection - DIALYSIS  60 mcg Subcutaneous Q Thu-1800   doxercalciferol  6 mcg Intravenous Q T,Th,Sa-HD   ferric citrate  630 mg Oral TID WC   heparin  5,000 Units Subcutaneous Q8H   heparin  7,000 Units Dialysis Once in dialysis   heparin sodium (porcine)       insulin aspart  0-6 Units Subcutaneous Q4H   metroNIDAZOLE  500 mg Oral Q12H   midodrine  15 mg Oral Q8H   mupirocin ointment  1 Application Nasal BID   Continuous Infusions:  sodium chloride Stopped (12/08/22 1726)   vancomycin Stopped (12/09/22 1323)   PRN Meds:.acetaminophen, docusate sodium, heparin sodium (porcine), HYDROmorphone (DILAUDID) injection, ondansetron (ZOFRAN) IV, ondansetron **OR** [DISCONTINUED] ondansetron (ZOFRAN) IV, mouth rinse, oxyCODONE, polyethylene glycol  HPI: ZYTAVIOUS [name] is a 47 y.o. male with past medical history significant hypertension paroxysmal atrial fibrillation end-stage renal disease on hemodialysis with catheter associated thrombosis on Eliquis, chronic heart failure pulmonary hypertension admitted with worsening drainage of fluid from an ulcer in his right foot that been present for 2 months.  He also  developed fevers and malaise.  The ER he was tachycardic and febrile to 102.7.  Blood cultures were taken which have fortunately been negative.  He has been on vancomycin and cefepime.  MRI of the foot showed the plantar ulcer at the great toe with coalescing abscess medial to the great toe metatarsophalangeal joint and dorsal medial to the length of the proximal phalanx.  There is no overt osteomyelitis seen on imaging.  Patient was taken the operating by Dr. Ralene Cork and underwent I&D of abscess on the left first 2024.  Cultures have yielded MRSA as well as beta-lactamase producing Bacteroides fragilis.  There remains concern for osteomyelitis and ray amputation is planned for tomorrow.   I have personally spent 82 minutes involved in face-to-face and non-face-to-face activities for this patient on the day of the visit. Professional time spent includes the following activities: Preparing to see the patient (review of tests), Obtaining and/or reviewing separately obtained history (admission/discharge record), Performing a medically appropriate examination and/or evaluation , Ordering medications/tests/procedures, referring and communicating with other health care professionals, Documenting clinical information in the EMR, Independently interpreting results (not separately reported), Communicating results to the patient/family/caregiver, Counseling and educating the patient/family/caregiver and Care coordination (not separately reported).     Review of Systems: Review of Systems  Constitutional:  Positive for chills, fever and malaise/fatigue. Negative for weight loss.  HENT:  Negative for congestion and sore throat.   Eyes:  Negative for blurred vision and photophobia.  Respiratory:  Negative for cough, shortness of breath and wheezing.   Cardiovascular:  Negative for chest pain, palpitations and leg swelling.  Gastrointestinal:  Positive for nausea. Negative for abdominal pain, blood in  stool, constipation, diarrhea, heartburn, melena and vomiting.  Genitourinary:  Negative for dysuria, flank pain and hematuria.  Musculoskeletal:  Positive for myalgias. Negative for back pain, falls and joint pain.  Skin:  Negative for itching and rash.  Neurological:  Positive for weakness. Negative for dizziness, focal weakness, loss of consciousness and headaches.  Endo/Heme/Allergies:  Does not bruise/bleed easily.  Psychiatric/Behavioral:  Negative for depression and suicidal ideas. The patient does not have insomnia.     Past Medical History:  Diagnosis Date   Acute respiratory failure with hypoxia (HCC) 11/30/2018   Anemia    ESRD   ESRD on hemodialysis (HCC) 06/07/2011   TTS [facility]. Started HD in 2006, got transplant in 2014 lasted until Dec 2019 then went back on HD.  Has L thigh AVG as of Jun 2020.  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    History of blood transfusion    Hypertension    03/19/22- has not had high blood pressure in 5 years.   Morbid obesity (HCC)    Paroxysmal atrial fibrillation (HCC)    Pre-diabetes    no meds   Sepsis (HCC) 11/30/2018   Sleep apnea    lost weight, does not use CPAP    Social History   Tobacco Use   Smoking status: Former    Types: Cigarettes    Quit date: 11/08/2015    Years since quitting: 7.0   Smokeless tobacco: Never  Vaping Use   Vaping Use: Never used  Substance Use Topics   Alcohol use: Not Currently   Drug use: Not Currently    Types: Marijuana    Comment: Last use was in 04/2022    Family History  Problem Relation Age of Onset   Diabetes Father    Stroke Father    Stroke Maternal Grandmother    Anesthesia problems Neg Hx    No Known Allergies  OBJECTIVE: Blood pressure (!) 78/57, pulse 94, temperature 97.8 F (36.6 C), resp. rate 15, height 6\' 2"  (1.88 m), weight (!) 145.8 kg, SpO2 98 %.  Physical Exam Constitutional:      Appearance: He is well-developed.  HENT:     Head: Normocephalic  and atraumatic.  Eyes:     Conjunctiva/sclera: Conjunctivae normal.  Cardiovascular:     Rate and Rhythm: Normal rate and regular rhythm.  Pulmonary:     Effort: Pulmonary effort is normal. No respiratory distress.     Breath sounds: No wheezing.  Abdominal:     General: There is no distension.     Palpations: Abdomen is soft.  Musculoskeletal:     Cervical back: Normal range of motion and neck supple.  Skin:    General: Skin is warm and dry.     Coloration: Skin is not pale.     Findings: No erythema or rash.  Neurological:     General: No focal deficit present.     Mental Status: He is alert and oriented to person, place, and time.  Psychiatric:        Mood and Affect: Mood normal.        Behavior: Behavior normal.        Thought Content: Thought content normal.        Judgment: Judgment normal.    Foot bandaged  Lab Results Lab Results  Component Value Date   WBC 13.9 (H) 12/11/2022   HGB 9.4 (L) 12/11/2022   HCT 29.3 (L) 12/11/2022   MCV 92.1 12/11/2022   PLT 295 12/11/2022    Lab Results  Component Value Date   CREATININE 15.98 (H) 12/11/2022   BUN 80 (H) 12/11/2022   NA 131 (L) 12/11/2022   K 5.2 (H) 12/11/2022   CL 91 (L) 12/11/2022   CO2 22 12/11/2022    Lab Results  Component Value Date   ALT 31 12/06/2022   AST 26 12/06/2022   ALKPHOS 58 12/06/2022   BILITOT 1.1 12/06/2022     Microbiology: Recent Results (from the past 240 hour(s))  Blood culture (routine x 2)     Status: None   Collection Time: 12/06/22 12:20 PM   Specimen: BLOOD LEFT HAND  Result Value Ref Range Status   Specimen Description BLOOD LEFT HAND  Final   Special Requests   Final    BOTTLES DRAWN AEROBIC ONLY Blood Culture results may not be optimal due to an inadequate volume of blood received in culture bottles   Culture   Final    NO GROWTH 5 DAYS Performed at [facility], [address] [place], [place] [address]    Report Status 12/11/2022 FINAL  Final  Blood  culture (routine x 2)     Status: Abnormal   Collection Time: 12/06/22 12:45 PM   Specimen: BLOOD  Result Value Ref Range Status   Specimen Description BLOOD SITE NOT SPECIFIED  Final   Special Requests   Final    BOTTLES DRAWN AEROBIC AND ANAEROBIC Blood Culture results may not be optimal due to an inadequate volume of blood received in culture bottles   Culture  Setup Time   Final    GRAM POSITIVE COCCI AEROBIC BOTTLE ONLY CRITICAL RESULT CALLED TO, READ BACK BY AND VERIFIED WITH: PHARMD LAUREN BELL [id number] AT 1730 BY EC    Culture (A)  Final    STAPHYLOCOCCUS EPIDERMIDIS THE SIGNIFICANCE OF ISOLATING THIS ORGANISM FROM A SINGLE VENIPUNCTURE CANNOT BE PREDICTED WITHOUT FURTHER CLINICAL AND CULTURE CORRELATION. SUSCEPTIBILITIES AVAILABLE ONLY ON REQUEST. Performed at [facility], [address], [place], [place] [address]    Report Status 12/08/2022 FINAL  Final  Blood Culture ID Panel (Reflexed)     Status: Abnormal   Collection Time: 12/06/22 12:45 PM  Result Value Ref Range Status   Enterococcus faecalis NOT DETECTED NOT DETECTED Final   Enterococcus Faecium NOT DETECTED NOT DETECTED Final   Listeria monocytogenes NOT DETECTED NOT DETECTED Final   Staphylococcus species DETECTED (A) NOT DETECTED Final    Comment: CRITICAL RESULT CALLED TO, READ BACK BY AND VERIFIED WITH: PHARMD LAUREN BELL [id number] AT 1730 BY EC    Staphylococcus aureus (BCID) NOT DETECTED NOT DETECTED Final   Staphylococcus epidermidis DETECTED (A) NOT DETECTED Final    Comment: Methicillin (oxacillin) resistant coagulase negative staphylococcus. Possible blood culture contaminant (unless isolated from more than one blood culture draw or clinical case suggests pathogenicity). No antibiotic treatment is indicated for blood  culture contaminants. REPEAT WITH NEW VIAL OF CTRL PHARMD LAUREN BELL [id number] AT 1730 BY EC    Staphylococcus lugdunensis NOT DETECTED NOT DETECTED Final   Streptococcus species  NOT DETECTED NOT DETECTED Final   Streptococcus agalactiae NOT DETECTED NOT DETECTED Final   Streptococcus pneumoniae NOT DETECTED NOT DETECTED Final   Streptococcus pyogenes NOT DETECTED NOT DETECTED Final   A.calcoaceticus-baumannii  NOT DETECTED NOT DETECTED Final   Bacteroides fragilis NOT DETECTED NOT DETECTED Final   Enterobacterales NOT DETECTED NOT DETECTED Final   Enterobacter cloacae complex NOT DETECTED NOT DETECTED Final   Escherichia coli NOT DETECTED NOT DETECTED Final   Klebsiella aerogenes NOT DETECTED NOT DETECTED Final   Klebsiella oxytoca NOT DETECTED NOT DETECTED Final   Klebsiella pneumoniae NOT DETECTED NOT DETECTED Final   Proteus species NOT DETECTED NOT DETECTED Final   Salmonella species NOT DETECTED NOT DETECTED Final   Serratia marcescens NOT DETECTED NOT DETECTED Final   Haemophilus influenzae NOT DETECTED NOT DETECTED Final   Neisseria meningitidis NOT DETECTED NOT DETECTED Final   Pseudomonas aeruginosa NOT DETECTED NOT DETECTED Final   Stenotrophomonas maltophilia NOT DETECTED NOT DETECTED Final   Candida albicans NOT DETECTED NOT DETECTED Final   Candida auris NOT DETECTED NOT DETECTED Final   Candida glabrata NOT DETECTED NOT DETECTED Final   Candida krusei NOT DETECTED NOT DETECTED Final   Candida parapsilosis NOT DETECTED NOT DETECTED Final   Candida tropicalis NOT DETECTED NOT DETECTED Final   Cryptococcus neoformans/gattii NOT DETECTED NOT DETECTED Final   Methicillin resistance mecA/C DETECTED (A) NOT DETECTED Final    Comment: CRITICAL RESULT CALLED TO, READ BACK BY AND VERIFIED WITH: Chestine Spore [id number] AT 1730 BY EC Performed at [facility], [address], [place], [place] [address]   MRSA Next Gen by PCR, Nasal     Status: Abnormal   Collection Time: 12/06/22  4:33 PM   Specimen: Nasal Mucosa; Nasal Swab  Result Value Ref Range Status   MRSA by PCR Next Gen DETECTED (A) NOT DETECTED Final    Comment: RESULT CALLED TO, READ  BACK BY AND VERIFIED WITH: 12/06/2022 AT 1836 TO RN Donney Rankins., ADC (NOTE) The GeneXpert MRSA Assay (FDA approved for NASAL specimens only), is one component of a comprehensive MRSA colonization surveillance program. It is not intended to diagnose MRSA infection nor to guide or monitor treatment for MRSA infections. Test performance is not FDA approved in patients less than 60 years old. Performed at  [facility], [address], [place], [place] [address]   Aerobic/Anaerobic Culture w Gram Stain (surgical/deep wound)     Status: None   Collection Time: 12/08/22  5:12 PM   Specimen: Path fluid; Tissue  Result Value Ref Range Status   Specimen Description WOUND  Final   Special Requests RIGHT FOOT  Final   Gram Stain   Final    RARE WBC PRESENT, PREDOMINANTLY PMN NO ORGANISMS SEEN    Culture   Final    MODERATE METHICILLIN RESISTANT STAPHYLOCOCCUS AUREUS MODERATE BACTEROIDES FRAGILIS BETA LACTAMASE POSITIVE Performed at [facility], [address], [place], [place] [address]    Report Status 12/11/2022 FINAL  Final   Organism ID, Bacteria METHICILLIN RESISTANT STAPHYLOCOCCUS AUREUS  Final      Susceptibility   Methicillin resistant staphylococcus aureus - MIC*    CIPROFLOXACIN <=0.5 SENSITIVE Sensitive     ERYTHROMYCIN >=8 RESISTANT Resistant     GENTAMICIN <=0.5 SENSITIVE Sensitive     OXACILLIN >=4 RESISTANT Resistant     TETRACYCLINE <=1 SENSITIVE Sensitive     VANCOMYCIN 1 SENSITIVE Sensitive     TRIMETH/SULFA <=10 SENSITIVE Sensitive     CLINDAMYCIN <=0.25 SENSITIVE Sensitive     RIFAMPIN <=0.5 SENSITIVE Sensitive     Inducible Clindamycin NEGATIVE Sensitive     LINEZOLID 2 SENSITIVE Sensitive     *  MODERATE METHICILLIN RESISTANT STAPHYLOCOCCUS AUREUS  Aerobic/Anaerobic Culture w Gram Stain (surgical/deep wound)     Status: None   Collection Time: 12/08/22  5:21 PM   Specimen: Path fluid; Tissue  Result Value Ref Range Status   Specimen Description  WOUND  Final   Special Requests FIRST METATARSAL  Final   Gram Stain NO WBC SEEN NO ORGANISMS SEEN   Final   Culture   Final    FEW METHICILLIN RESISTANT STAPHYLOCOCCUS AUREUS FEW BACTEROIDES FRAGILIS BETA LACTAMASE POSITIVE Performed at [facility], [address], [place], [place] [address]    Report Status 12/11/2022 FINAL  Final   Organism ID, Bacteria METHICILLIN RESISTANT STAPHYLOCOCCUS AUREUS  Final      Susceptibility   Methicillin resistant staphylococcus aureus - MIC*    CIPROFLOXACIN <=0.5 SENSITIVE Sensitive     ERYTHROMYCIN >=8 RESISTANT Resistant     GENTAMICIN <=0.5 SENSITIVE Sensitive     OXACILLIN >=4 RESISTANT Resistant     TETRACYCLINE <=1 SENSITIVE Sensitive     VANCOMYCIN 1 SENSITIVE Sensitive     TRIMETH/SULFA <=10 SENSITIVE Sensitive     CLINDAMYCIN <=0.25 SENSITIVE Sensitive     RIFAMPIN <=0.5 SENSITIVE Sensitive     Inducible Clindamycin NEGATIVE Sensitive     LINEZOLID 2 SENSITIVE Sensitive     * FEW METHICILLIN RESISTANT STAPHYLOCOCCUS AUREUS    Acey Lav, MD [facility] [phone] pager  12/11/2022, 2:29 PM

## 2022-12-11 NOTE — H&P (View-Only) (Signed)
Subjective:  Patient ID: [name], male    DOB: 08/23/1975,  MRN: [id number]  Patient POD#3  s/p right foot Incision and drainage. Patient relates pain still and maybe worse. Patient is nervous and frustrated Denies n/v/f/c.   Past Medical History:  Diagnosis Date   Acute respiratory failure with hypoxia (HCC) 11/30/2018   Anemia    ESRD   ESRD on hemodialysis (HCC) 06/07/2011   TTS [facility]. Started HD in 2006, got transplant in 2014 lasted until Dec 2019 then went back on HD.  Has L thigh AVG as of Jun 2020.  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    History of blood transfusion    Hypertension    03/19/22- has not had high blood pressure in 5 years.   Morbid obesity (HCC)    Paroxysmal atrial fibrillation (HCC)    Pre-diabetes    no meds   Sepsis (HCC) 11/30/2018   Sleep apnea    lost weight, does not use CPAP     Past Surgical History:  Procedure Laterality Date   ARTERIOVENOUS GRAFT PLACEMENT  08/09/2010   Left Thigh Graft by Dr. Chris Dickson   AV FISTULA PLACEMENT Right 05/18/2018   Procedure: INSERTION OF ARTERIOVENOUS (AV) GORE-TEX GRAFT Left THIGH;  Surgeon: Cain, Brandon Christopher, MD;  Location: [facility];  Service: Vascular;  Laterality: Right;   AV FISTULA PLACEMENT Right 02/15/2021   Procedure: INSERTION OF ARTERIOVENOUS (AV) GORE-TEX LOOP GRAFT RIGHT THIGH;  Surgeon: Cain, Brandon Christopher, MD;  Location: [facility];  Service: Vascular;  Laterality: Right;   BIOPSY  05/20/2022   Procedure: BIOPSY;  Surgeon: Cirigliano, Vito V, DO;  Location: [facility];  Service: Gastroenterology;;   CAPD INSERTION N/A 06/13/2022   Procedure: LAPAROSCOPIC INSERTION CONTINUOUS AMBULATORY PERITONEAL DIALYSIS  (CAPD) CATHETER;  Surgeon: Cain, Brandon Christopher, MD;  Location: [facility];  Service: Vascular;  Laterality: N/A;   CAPD REMOVAL  05/08/2011   Procedure: CONTINUOUS AMBULATORY PERITONEAL DIALYSIS  (CAPD) CATHETER REMOVAL;  Surgeon: William J Weatherly, MD;   Location: [facility];  Service: General;  Laterality: N/A;  Removal of CAPD catheter, Dr. requests to go after 100   CAPD REMOVAL N/A 08/08/2022   Procedure: REMOVAL CONTINUOUS AMBULATORY PERITONEAL DIALYSIS  (CAPD) CATHETER;  Surgeon: Cain, Brandon Christopher, MD;  [facility]: [facility];  Service: Vascular;  Laterality: N/A;   COLONOSCOPY WITH PROPOFOL N/A 05/20/2022   Procedure: COLONOSCOPY WITH PROPOFOL;  Surgeon: Cirigliano, Vito V, DO;  Location: [facility];  Service: Gastroenterology;  Laterality: N/A;   I & D EXTREMITY Right 12/08/2022   Procedure: IRRIGATION AND DEBRIDEMENT RIGHT FOOT;  Surgeon: Carston Riedl, DPM;  Location: [facility];  Service: Orthopedics/Podiatry;  Laterality: Right;   INSERTION OF DIALYSIS CATHETER  10/05/2010   Right Femoral Cath insertion by Dr. Brian Chen.  Pt ahas had several caths inserted.   INSERTION OF DIALYSIS CATHETER Right 02/15/2021   Procedure: Attempted INSERTION OF RIGHT and Left Internal Jugular DIALYSIS CATHETER, Insertion of Left Femoral Vein Dialysis Catheter;  Surgeon: Cain, Brandon Christopher, MD;  [facility]: [facility];  Service: Vascular;  Laterality: Right;   INSERTION OF DIALYSIS CATHETER Right 04/26/2021   Procedure: INSERTION OF TUNNELED 55cm PALIDROME PRECISION CHRONIC DIALYSIS CATHETER;  Surgeon: Hawken, Thomas N, MD;  [facility]: [facility];  Service: Vascular;  Laterality: Right;   INSERTION OF DIALYSIS CATHETER Left 12/26/2021   Procedure: INSERTION OF TUNNELED  DIALYSIS CATHETER LEFT FEMORAL ARTERY;  Surgeon: Cain, Brandon Christopher, MD;  [facility];    Service: Vascular;  Laterality: Left;   INSERTION OF DIALYSIS CATHETER Left 03/06/2022   Procedure: INSERTION OF DIALYSIS CATHETER;  Surgeon: Cain, Brandon Christopher, MD;  [facility];  Service: Vascular;  Laterality: Left;   IR FLUORO GUIDE CV LINE LEFT  04/23/2022   IR FLUORO GUIDE CV LINE RIGHT  05/11/2018   IR FLUORO GUIDE CV LINE RIGHT  07/03/2021   IR FLUORO GUIDE CV LINE RIGHT  07/16/2021   IR PTA  ADDL CENTRAL DIALYSIS SEG THRU DIALY CIRCUIT RIGHT Right 07/03/2021   IR US GUIDE VASC ACCESS LEFT  04/23/2022   IR US GUIDE VASC ACCESS RIGHT  05/11/2018   IR VENO/EXT/UNI LEFT  04/23/2022   IR VENOCAVAGRAM IVC  07/16/2021   KIDNEY TRANSPLANT  2014   KNEE ARTHROSCOPY Left    LAPAROSCOPIC LYSIS OF ADHESIONS N/A 06/13/2022   Procedure: LAPAROSCOPIC LYSIS OF ADHESIONS;  Surgeon: Cain, Brandon Christopher, MD;  [facility];  Service: Vascular;  Laterality: N/A;   POLYPECTOMY  05/20/2022   Procedure: POLYPECTOMY;  Surgeon: Cirigliano, Vito V, DO;  Location: [facility];  Service: Gastroenterology;;   THROMBECTOMY W/ EMBOLECTOMY Right 04/26/2021   Procedure: REMOVAL OF RIGHT THIGH ARTERIOVENOUS GORE-TEX GRAFT AND REPAIR OF RIGHT COMMON FEMORAL ARTERY;  Surgeon: Hawken, Thomas N, MD;  [facility];  Service: Vascular;  Laterality: Right;   UPPER EXTREMITY ANGIOGRAPHY Bilateral 05/13/2018   Procedure: UPPER EXTREMITY ANGIOGRAPHY - bilarteral;  Surgeon: Clark, Christopher J, MD;  [facility];  Service: Cardiovascular;  Laterality: Bilateral;       Latest Ref Rng & Units 12/10/2022   12:46 AM 12/09/2022    1:27 AM 12/08/2022    4:33 PM  CBC  WBC 4.0 - 10.5 K/uL 13.7  15.0    Hemoglobin 13.0 - 17.0 g/dL 9.6  9.6  12.6   Hematocrit 39.0 - 52.0 % 30.1  30.0  37.0   Platelets 150 - 400 K/uL 231  218         Latest Ref Rng & Units 12/10/2022   12:46 AM 12/09/2022    1:27 AM 12/08/2022    5:44 PM  BMP  Glucose 70 - 99 mg/dL 88  106  108   BUN 6 - 20 mg/dL 58  76  69   Creatinine 0.61 - 1.24 mg/dL 13.67  17.52  16.85   Sodium 135 - 145 mmol/L 129  130  130   Potassium 3.5 - 5.1 mmol/L 5.1  5.5  5.4   Chloride 98 - 111 mmol/L 91  90  91   CO2 22 - 32 mmol/L 22  20  22   Calcium 8.9 - 10.3 mg/dL 9.5  9.5  9.1      Objective:   Vitals:   12/11/22 0329 12/11/22 0823  BP: (!) 91/47 (!) 79/56  Pulse: 63 63  Resp: 13 16  Temp: 98 F (36.7 C) 98.5 F (36.9 C)  SpO2: 99% 100%     General:AA&O x 3. Normal mood and affect   Vascular: DP and PT pulses 2/4 bilateral. Brisk capillary refill to all digits. Pedal hair present   Neruological. Epicritic sensation grossly intact.   Derm: Surgical wound with purulence noted today and  edema and erythema surrounding plantar wound and dorsal foot. Still very tender.   MSK: MMT 5/5 in dorsiflexion, plantar flexion, inversion and eversion. Normal joint ROM without pain or crepitus.    Vascular:  VAS US ABI W/WO TBI 07/25/2022  ABI Findings:  +---------+------------------+-----+---------+--------+    Right   Rt Pressure (mmHg)IndexWaveform Comment   +---------+------------------+-----+---------+--------+  Brachial 94                                        +---------+------------------+-----+---------+--------+  PTA     111               1.13 triphasic          +---------+------------------+-----+---------+--------+  DP      103               1.05 triphasic          +---------+------------------+-----+---------+--------+  Great Toe103               1.05                    +---------+------------------+-----+---------+--------+   +---------+------------------+-----+---------------------------+-------+  Left    Lt Pressure (mmHg)IndexWaveform                   Comment  +---------+------------------+-----+---------------------------+-------+  Brachial 98                                                         +---------+------------------+-----+---------------------------+-------+  PTA     112               1.14 triphasic                           +---------+------------------+-----+---------------------------+-------+  DP      99                1.01 triphasic                           +---------+------------------+-----+---------------------------+-------+  Great Toe                       2nd toe waveform is present          +---------+------------------+-----+---------------------------+-------+   +-------+-----------+--------------------------------+------------+--------  ----+  ABI/TBIToday's ABIToday's TBI                     Previous  ABIPrevious TBI  +-------+-----------+--------------------------------+------------+--------  ----+  Right 1.13       1.05                            1.24        0.66           +-------+-----------+--------------------------------+------------+--------  ----+  Left  1.14       great toe ulcer/2nd toe         1.16        1.16                             waveforms present.                                         +-------+-----------+--------------------------------+------------+--------  ----+  Previous ABI at [facility] on 12/28/20.    Summary:  Right: Resting right ankle-brachial index is within normal range. The right toe-brachial index is normal.  Left: Resting left ankle-brachial index is within normal range. Great toe ulcer. Second toe waveform is present   DG FOOT COMPLETE RIGHT 12/06/2022 FINDINGS: No fracture or dislocation. Normal mineralization. Mild hallux valgus deformity. Small calcaneal spurs. No subcutaneous gas or radiodense foreign body.   IMPRESSION: 1. No acute findings. 2. Mild hallux valgus.   MR FOOT RIGHT WO CONTRAST 12/06/2022 IMPRESSION: 1. Small plantar skin ulcer at the level of the great toe metatarsophalangeal joint with associated soft tissue edema and swelling about the medial greater than lateral aspects of the great toe and medial greater than lateral aspects of the midfoot consistent with cellulitis. There are regions of coalescing fluid medial to the great toe metatarsophalangeal joints and dorsal medial to the length of the proximal phalanx of the great toe suspicious for abscesses. 2. No cortical erosion or marrow edema is seen to indicate MRI evidence of acute osteomyelitis.  Assessment & Plan:   Patient was evaluated and treated and all questions answered.  DX: Right foot abscess and diabetic foot infection Dressing and packing changed today at bedside. There was a scant amount of purulence present on todays exam that was expressed and washed out.  Antibiotics: Continue IV cefepime. Cutlures growing staph aureus in first metatrsal bone and pathology positive for osteomyelitis . Discussed with patient concern for bone infection and foot has not improved overnight so will plan for amputation of the right first ray tomorrow.  NPO after midnight.  Patient may be heel weight bearing to operative extremity in surgical shoe.  Patient in agreement with plan  Podiatry to continue to follow   Lilyanah Celestin, DPM  Accessible via secure chat for questions or concerns. 

## 2022-12-11 NOTE — Anesthesia Preprocedure Evaluation (Addendum)
Anesthesia Evaluation  Patient identified by MRN, date of birth, ID band Patient awake    Reviewed: Allergy & Precautions, NPO status , Patient's Chart, lab work & pertinent test results  History of Anesthesia Complications Negative for: history of anesthetic complications  Airway Mallampati: III  TM Distance: >3 FB Neck ROM: Full    Dental  (+) Dental Advisory Given   Pulmonary sleep apnea , former smoker   Pulmonary exam normal        Cardiovascular hypertension, Normal cardiovascular exam+ dysrhythmias Atrial Fibrillation    '24 TTE - EF 55-60%, no valve disorders identified    Neuro/Psych negative neurological ROS  negative psych ROS   GI/Hepatic negative GI ROS,,,(+)     substance abuse  marijuana use  Endo/Other    Morbid obesity Pre-DM Na 131 K 5.2 Cl 91  Renal/GU ESRF and DialysisRenal disease Hx renal transplant s/p failure      Musculoskeletal negative musculoskeletal ROS (+)    Abdominal  (+) + obese  Peds  Hematology  (+) Blood dyscrasia, anemia  On eliquis    Anesthesia Other Findings Noncompliant   Reproductive/Obstetrics                             Anesthesia Physical Anesthesia Plan  ASA: 3  Anesthesia Plan: MAC   Post-op Pain Management: Tylenol PO (pre-op)*   Induction:   PONV Risk Score and Plan: 1 and Propofol infusion and Treatment may vary due to age or medical condition  Airway Management Planned: Natural Airway and Simple Face Mask  Additional Equipment: None  Intra-op Plan:   Post-operative Plan:   Informed Consent: I have reviewed the patients History and Physical, chart, labs and discussed the procedure including the risks, benefits and alternatives for the proposed anesthesia with the patient or authorized representative who has indicated his/her understanding and acceptance.       Plan Discussed with: CRNA and  Anesthesiologist  Anesthesia Plan Comments:         Anesthesia Quick Evaluation

## 2022-12-11 NOTE — Progress Notes (Signed)
Pt has blanket over his face. Pt educated that blanket cannot cover his face and that its not negotiable due to safety concerns and cannot run tx like that. Pt non compliant and wont remove blanket off his face.

## 2022-12-11 NOTE — Progress Notes (Signed)
PROGRESS NOTE    [name]  WUJ:[id number] DOB: [birth year] DOA: 12/06/2022 PCP: [name], MD    Brief Narrative:  47 year old ESRD on hemodialysis TTS with right femoral TDC, chronic Eliquis therapy for paroxysmal A-fib and clot radiographs, sleep apnea not using CPAP presented to the hospital with 2 months of right foot ulcer not healing.  In the emergency room initially with low blood pressures, admitted to ICU, subsequently started on midodrine and transferred out of ICU.  Patient remains in the hospital for surgical intervention and IV antibiotics.   Assessment & Plan:   Severe sepsis from right foot ulcer/abscess and osteomyelitis: Right foot x-ray was negative for osteomyelitis. MRI negative for osteomyelitis but patient has right great toe abscess. Blood cultures on admission Staph epidermidis Incision and drainage of foot abscess on 7/1, surgical cultures with MRSA.  Currently on vancomycin. Continue vancomycin renally dosed with hemodialysis. Patient is still with purulence, right great toe abscess and osteomyelitis.  For ray amputation tomorrow. Continue local wound care. Given significant infection, he may need IV antibiotics even after partial amputation.  Will discuss with infectious disease for antibiotic management.  Chronic hypotension: Asymptomatic.  On midodrine 15 mg 3 times daily.  ESRD on hemodialysis, hyperkalemia: Improved.  Getting dialysis today.  Chronic diastolic heart failure: Euvolemic.  OSA not on CPAP: Refusing CPAP at night.  PAF on Eliquis: Normal sinus rhythm.  Not on any rate control medications.  On hold for surgery tomorrow.   DVT prophylaxis: heparin injection 5,000 Units Start: 12/11/22 0915 SCDs Start: 12/06/22 1433   Code Status: Full code.  Family Communication: None at the bedside Disposition Plan: Status is: Inpatient Remains inpatient appropriate because: Further surgical interventions planned     Consultants:   Podiatry Nephrology  Procedures:  I&D of the right foot  Antimicrobials:  Vancomycin  7/1   Subjective:  Patient seen in the morning rounds with surgeon at the bedside.  Wound is grossly purulent.  Patient is anxious about the further surgery but agrees with recommendations.  Pain relieved with oral pain medications.  Afebrile.  Objective: Vitals:   12/11/22 0913 12/11/22 1000 12/11/22 1030 12/11/22 1100  BP: (!) 86/57 (!) 89/54 (!) 88/59   Pulse: 60 (!) 54 (!) 58 64  Resp: 18 18 (!) 22 (!) 23  Temp: 97.8 F (36.6 C)     TempSrc:      SpO2: 99% 94% 95% (!) 86%  Weight: (!) 145.8 kg     Height:        Intake/Output Summary (Last 24 hours) at 12/11/2022 1122 Last data filed at 12/11/2022 0824 Gross per 24 hour  Intake 120 ml  Output --  Net 120 ml    Filed Weights   12/09/22 1229 12/11/22 0329 12/11/22 0913  Weight: (!) 144.4 kg (!) 143.7 kg (!) 145.8 kg    Examination:  General: Appropriately anxious.  Not in any distress.  On room air. Cardiovascular: S1-S2 normal.  Regular rate rate rhythm. Respiratory: Bilateral clear.  No added sounds. Gastrointestinal: Soft.  Nontender. Ext: Right femoral AV graft, hemodialysis catheter left groin. Right foot with open incision on the medial aspect.  Grossly purulent drainage.  Pictures in the chart. Neuro: Alert awake and oriented. Musculoskeletal: No deformities.      Data Reviewed: I have personally reviewed following labs and imaging studies  CBC: Recent Labs  Lab 12/06/22 1053 12/07/22 0646 12/08/22 1633 12/09/22 0127 12/10/22 0046 12/11/22 0915  WBC 14.4* 16.1*  --  15.0* 13.7* 13.9*  NEUTROABS 11.3*  --   --   --   --   --   HGB 12.6* 10.5* 12.6* 9.6* 9.6* 9.4*  HCT 39.8 32.9* 37.0* 30.0* 30.1* 29.3*  MCV 92.8 94.5  --  91.7 89.9 92.1  PLT 189 182  --  218 231 295    Basic Metabolic Panel: Recent Labs  Lab 12/07/22 0646 12/08/22 0049 12/08/22 1633 12/08/22 1744 12/09/22 0127 12/10/22 0046  12/11/22 0915  NA 132* 130* 127* 130* 130* 129* 131*  K 4.9 5.5* 6.1* 5.4* 5.5* 5.1 5.2*  CL 92* 92* 97* 91* 90* 91* 91*  CO2 23 20*  --  22 20* 22 22  GLUCOSE 125* 99 101* 108* 106* 88 103*  BUN 49* 61* 64* 69* 76* 58* 80*  CREATININE 14.47* 15.72* 16.90* 16.85* 17.52* 13.67* 15.98*  CALCIUM 9.3 9.2  --  9.1 9.5 9.5 9.8  MG 2.2  --   --   --  2.1 2.1  --   PHOS 6.3*  --   --   --   --   --  7.4*    GFR: Estimated Creatinine Clearance: 8.6 mL/min (A) (by C-G formula based on SCr of 15.98 mg/dL (H)). Liver Function Tests: Recent Labs  Lab 12/06/22 1053 12/11/22 0915  AST 26  --   ALT 31  --   ALKPHOS 58  --   BILITOT 1.1  --   PROT 8.5*  --   ALBUMIN 3.8 2.5*    No results for input(s): "LIPASE", "AMYLASE" in the last 168 hours. No results for input(s): "AMMONIA" in the last 168 hours. Coagulation Profile: No results for input(s): "INR", "PROTIME" in the last 168 hours. Cardiac Enzymes: No results for input(s): "CKTOTAL", "CKMB", "CKMBINDEX", "TROPONINI" in the last 168 hours. BNP (last 3 results) No results for input(s): "PROBNP" in the last 8760 hours. HbA1C: No results for input(s): "HGBA1C" in the last 72 hours. CBG: Recent Labs  Lab 12/10/22 0601 12/10/22 1117 12/10/22 1611 12/10/22 2130 12/11/22 0555  GLUCAP 99 99 86 98 76    Lipid Profile: No results for input(s): "CHOL", "HDL", "LDLCALC", "TRIG", "CHOLHDL", "LDLDIRECT" in the last 72 hours. Thyroid Function Tests: No results for input(s): "TSH", "T4TOTAL", "FREET4", "T3FREE", "THYROIDAB" in the last 72 hours. Anemia Panel: No results for input(s): "VITAMINB12", "FOLATE", "FERRITIN", "TIBC", "IRON", "RETICCTPCT" in the last 72 hours. Sepsis Labs: Recent Labs  Lab 12/06/22 1052 12/06/22 1253 12/08/22 0049  LATICACIDVEN 3.2* 2.7* 1.0     Recent Results (from the past 240 hour(s))  Blood culture (routine x 2)     Status: None (Preliminary result)   Collection Time: 12/06/22 12:20 PM   Specimen:  BLOOD LEFT HAND  Result Value Ref Range Status   Specimen Description BLOOD LEFT HAND  Final   Special Requests   Final    BOTTLES DRAWN AEROBIC ONLY Blood Culture results may not be optimal due to an inadequate volume of blood received in culture bottles   Culture   Final    NO GROWTH 4 DAYS Performed at [facility], [address], [place], [place] [address]    Report Status PENDING  Incomplete  Blood culture (routine x 2)     Status: Abnormal   Collection Time: 12/06/22 12:45 PM   Specimen: BLOOD  Result Value Ref Range Status   Specimen Description BLOOD SITE NOT SPECIFIED  Final   Special Requests   Final    BOTTLES DRAWN  AEROBIC AND ANAEROBIC Blood Culture results may not be optimal due to an inadequate volume of blood received in culture bottles   Culture  Setup Time   Final    GRAM POSITIVE COCCI AEROBIC BOTTLE ONLY CRITICAL RESULT CALLED TO, READ BACK BY AND VERIFIED WITH: PHARMD LAUREN BELL [id number] AT 1730 BY EC    Culture (A)  Final    STAPHYLOCOCCUS EPIDERMIDIS THE SIGNIFICANCE OF ISOLATING THIS ORGANISM FROM A SINGLE VENIPUNCTURE CANNOT BE PREDICTED WITHOUT FURTHER CLINICAL AND CULTURE CORRELATION. SUSCEPTIBILITIES AVAILABLE ONLY ON REQUEST. Performed at [facility], [address], [place], [place] [address]    Report Status 12/08/2022 FINAL  Final  Blood Culture ID Panel (Reflexed)     Status: Abnormal   Collection Time: 12/06/22 12:45 PM  Result Value Ref Range Status   Enterococcus faecalis NOT DETECTED NOT DETECTED Final   Enterococcus Faecium NOT DETECTED NOT DETECTED Final   Listeria monocytogenes NOT DETECTED NOT DETECTED Final   Staphylococcus species DETECTED (A) NOT DETECTED Final    Comment: CRITICAL RESULT CALLED TO, READ BACK BY AND VERIFIED WITH: PHARMD LAUREN BELL [id number] AT 1730 BY EC    Staphylococcus aureus (BCID) NOT DETECTED NOT DETECTED Final   Staphylococcus epidermidis DETECTED (A) NOT DETECTED Final    Comment: Methicillin  (oxacillin) resistant coagulase negative staphylococcus. Possible blood culture contaminant (unless isolated from more than one blood culture draw or clinical case suggests pathogenicity). No antibiotic treatment is indicated for blood  culture contaminants. REPEAT WITH NEW VIAL OF CTRL PHARMD LAUREN BELL [id number] AT 1730 BY EC    Staphylococcus lugdunensis NOT DETECTED NOT DETECTED Final   Streptococcus species NOT DETECTED NOT DETECTED Final   Streptococcus agalactiae NOT DETECTED NOT DETECTED Final   Streptococcus pneumoniae NOT DETECTED NOT DETECTED Final   Streptococcus pyogenes NOT DETECTED NOT DETECTED Final   A.calcoaceticus-baumannii NOT DETECTED NOT DETECTED Final   Bacteroides fragilis NOT DETECTED NOT DETECTED Final   Enterobacterales NOT DETECTED NOT DETECTED Final   Enterobacter cloacae complex NOT DETECTED NOT DETECTED Final   Escherichia coli NOT DETECTED NOT DETECTED Final   Klebsiella aerogenes NOT DETECTED NOT DETECTED Final   Klebsiella oxytoca NOT DETECTED NOT DETECTED Final   Klebsiella pneumoniae NOT DETECTED NOT DETECTED Final   Proteus species NOT DETECTED NOT DETECTED Final   Salmonella species NOT DETECTED NOT DETECTED Final   Serratia marcescens NOT DETECTED NOT DETECTED Final   Haemophilus influenzae NOT DETECTED NOT DETECTED Final   Neisseria meningitidis NOT DETECTED NOT DETECTED Final   Pseudomonas aeruginosa NOT DETECTED NOT DETECTED Final   Stenotrophomonas maltophilia NOT DETECTED NOT DETECTED Final   Candida albicans NOT DETECTED NOT DETECTED Final   Candida auris NOT DETECTED NOT DETECTED Final   Candida glabrata NOT DETECTED NOT DETECTED Final   Candida krusei NOT DETECTED NOT DETECTED Final   Candida parapsilosis NOT DETECTED NOT DETECTED Final   Candida tropicalis NOT DETECTED NOT DETECTED Final   Cryptococcus neoformans/gattii NOT DETECTED NOT DETECTED Final   Methicillin resistance mecA/C DETECTED (A) NOT DETECTED Final    Comment: CRITICAL  RESULT CALLED TO, READ BACK BY AND VERIFIED WITH: Chestine Spore [id number] AT 1730 BY EC Performed at [facility], [address] [place], [place] [address]   MRSA Next Gen by PCR, Nasal     Status: Abnormal   Collection Time: 12/06/22  4:33 PM   Specimen: Nasal Mucosa; Nasal Swab  Result Value Ref Range Status   MRSA by PCR  Next Gen DETECTED (A) NOT DETECTED Final    Comment: RESULT CALLED TO, READ BACK BY AND VERIFIED WITH: 12/06/2022 AT 1836 TO RN Donney Rankins., ADC (NOTE) The GeneXpert MRSA Assay (FDA approved for NASAL specimens only), is one component of a comprehensive MRSA colonization surveillance program. It is not intended to diagnose MRSA infection nor to guide or monitor treatment for MRSA infections. Test performance is not FDA approved in patients less than 69 years old. Performed at [facility], [address] [place], [place] [address]   Aerobic/Anaerobic Culture w Gram Stain (surgical/deep wound)     Status: None   Collection Time: 12/08/22  5:12 PM   Specimen: Path fluid; Tissue  Result Value Ref Range Status   Specimen Description WOUND  Final   Special Requests RIGHT FOOT  Final   Gram Stain   Final    RARE WBC PRESENT, PREDOMINANTLY PMN NO ORGANISMS SEEN    Culture   Final    MODERATE METHICILLIN RESISTANT STAPHYLOCOCCUS AUREUS MODERATE BACTEROIDES FRAGILIS BETA LACTAMASE POSITIVE Performed at [facility], [address] [place], [place] [address]    Report Status 12/11/2022 FINAL  Final   Organism ID, Bacteria METHICILLIN RESISTANT STAPHYLOCOCCUS AUREUS  Final      Susceptibility   Methicillin resistant staphylococcus aureus - MIC*    CIPROFLOXACIN <=0.5 SENSITIVE Sensitive     ERYTHROMYCIN >=8 RESISTANT Resistant     GENTAMICIN <=0.5 SENSITIVE Sensitive     OXACILLIN >=4 RESISTANT Resistant     TETRACYCLINE <=1 SENSITIVE Sensitive     VANCOMYCIN 1 SENSITIVE Sensitive     TRIMETH/SULFA <=10 SENSITIVE Sensitive      CLINDAMYCIN <=0.25 SENSITIVE Sensitive     RIFAMPIN <=0.5 SENSITIVE Sensitive     Inducible Clindamycin NEGATIVE Sensitive     LINEZOLID 2 SENSITIVE Sensitive     * MODERATE METHICILLIN RESISTANT STAPHYLOCOCCUS AUREUS  Aerobic/Anaerobic Culture w Gram Stain (surgical/deep wound)     Status: None   Collection Time: 12/08/22  5:21 PM   Specimen: Path fluid; Tissue  Result Value Ref Range Status   Specimen Description WOUND  Final   Special Requests FIRST METATARSAL  Final   Gram Stain NO WBC SEEN NO ORGANISMS SEEN   Final   Culture   Final    FEW METHICILLIN RESISTANT STAPHYLOCOCCUS AUREUS FEW BACTEROIDES FRAGILIS BETA LACTAMASE POSITIVE Performed at [facility], [address], [place], [place] [address]    Report Status 12/11/2022 FINAL  Final   Organism ID, Bacteria METHICILLIN RESISTANT STAPHYLOCOCCUS AUREUS  Final      Susceptibility   Methicillin resistant staphylococcus aureus - MIC*    CIPROFLOXACIN <=0.5 SENSITIVE Sensitive     ERYTHROMYCIN >=8 RESISTANT Resistant     GENTAMICIN <=0.5 SENSITIVE Sensitive     OXACILLIN >=4 RESISTANT Resistant     TETRACYCLINE <=1 SENSITIVE Sensitive     VANCOMYCIN 1 SENSITIVE Sensitive     TRIMETH/SULFA <=10 SENSITIVE Sensitive     CLINDAMYCIN <=0.25 SENSITIVE Sensitive     RIFAMPIN <=0.5 SENSITIVE Sensitive     Inducible Clindamycin NEGATIVE Sensitive     LINEZOLID 2 SENSITIVE Sensitive     * FEW METHICILLIN RESISTANT STAPHYLOCOCCUS AUREUS         Radiology Studies: VAS [place] ABI WITH/WO TBI  Result Date: 12/10/2022  LOWER EXTREMITY DOPPLER STUDY Patient Name:  MACALEB SWINEA  Date of Exam:   12/10/2022 Medical Rec #: [id number]  Accession #:    [id number] Date of Birth: 1976/01/22          Patient Gender: M Patient Age:   41 years Exam Location:  [facility] Procedure:      VAS [place] ABI WITH/WO TBI Referring Phys: REBECCA SIKORA --------------------------------------------------------------------------------   Indications: Cellultis/RT foot infection, LT great toe ulceration High Risk Factors: Hypertension, hyperlipidemia, past history of smoking. Other Factors: ESRD (HD- LLE), CHF, Afib.  Limitations: Today's exam was limited due to bandages and skin texture              (thickened, hard), venous interference. Comparison Study: Previous exam on 07/15/2022 was WNL Performing Technologist: Ernestene Mention RVT, RDMS  Examination Guidelines: A complete evaluation includes at minimum, Doppler waveform signals and systolic blood pressure reading at the level of bilateral brachial, anterior tibial, and posterior tibial arteries, when vessel segments are accessible. Bilateral testing is considered an integral part of a complete examination. Photoelectric Plethysmograph (PPG) waveforms and toe systolic pressure readings are included as required and additional duplex testing as needed. Limited examinations for reoccurring indications may be performed as noted.  ABI Findings: +--------+------------------+-----+-----------+-----------------+ Right   Rt Pressure (mmHg)IndexWaveform   Comment           +--------+------------------+-----+-----------+-----------------+ Brachial105                    triphasic                    +--------+------------------+-----+-----------+-----------------+ PTA     111               1.06 multiphasic                  +--------+------------------+-----+-----------+-----------------+ DP      104               0.99 triphasic  audibly triphasic +--------+------------------+-----+-----------+-----------------+ +---------+------------------+-----+---------+-----------------+ Left     Lt Pressure (mmHg)IndexWaveform Comment           +---------+------------------+-----+---------+-----------------+ Brachial 84                     triphasic                  +---------+------------------+-----+---------+-----------------+ PTA      101               0.96 triphasic                   +---------+------------------+-----+---------+-----------------+ DP       97                0.92 triphasicaudibly triphasic +---------+------------------+-----+---------+-----------------+ Great Toe120               1.14 Normal                     +---------+------------------+-----+---------+-----------------+ +-------+-----------+-----------+------------+------------+ ABI/TBIToday's ABIToday's TBIPrevious ABIPrevious TBI +-------+-----------+-----------+------------+------------+ Right  1.06       bandaged   1.18                     +-------+-----------+-----------+------------+------------+ Left   0.96       1.14       0.99                     +-------+-----------+-----------+------------+------------+  Very difficult obtaining doppler signals due to venous interference.  Summary: Right: Resting right ankle-brachial index is within normal range. Unable to obtain TBI  due to wound/bandage. Left: Resting left ankle-brachial index is within normal range. The left toe-brachial index is normal. Great toe too large for cuff, second toe used. *See table(s) above for measurements and observations.  Electronically signed by Coral Else MD on 12/10/2022 at 9:19:21 PM.    Final         Scheduled Meds:  atorvastatin  40 mg Oral Daily   Chlorhexidine Gluconate Cloth  6 each Topical Daily   darbepoetin (ARANESP) injection - DIALYSIS  60 mcg Subcutaneous Q Thu-1800   doxercalciferol  6 mcg Intravenous Q T,Th,Sa-HD   ferric citrate  630 mg Oral TID WC   heparin  5,000 Units Subcutaneous Q8H   heparin  7,000 Units Dialysis Once in dialysis   heparin sodium (porcine)       insulin aspart  0-6 Units Subcutaneous Q4H   midodrine  15 mg Oral Q8H   mupirocin ointment  1 Application Nasal BID   Continuous Infusions:  sodium chloride Stopped (12/08/22 1726)   vancomycin Stopped (12/09/22 1323)     LOS: 5 days    Time spent: 35 minutes    [name], MD [facility]

## 2022-12-12 ENCOUNTER — Encounter (HOSPITAL_COMMUNITY): Payer: Self-pay | Admitting: Pulmonary Disease

## 2022-12-12 ENCOUNTER — Inpatient Hospital Stay (HOSPITAL_COMMUNITY): Payer: Medicare HMO | Admitting: Anesthesiology

## 2022-12-12 ENCOUNTER — Other Ambulatory Visit: Payer: Self-pay

## 2022-12-12 ENCOUNTER — Inpatient Hospital Stay (HOSPITAL_COMMUNITY): Payer: Medicare HMO

## 2022-12-12 ENCOUNTER — Encounter (HOSPITAL_COMMUNITY)
Admission: EM | Disposition: A | Discharge status: Home / Self Care | Payer: Self-pay | Source: Home / Self Care | Attending: Internal Medicine

## 2022-12-12 DIAGNOSIS — I132 Hypertensive heart and chronic kidney disease with heart failure and with stage 5 chronic kidney disease, or end stage renal disease: Secondary | ICD-10-CM

## 2022-12-12 DIAGNOSIS — Z992 Dependence on renal dialysis: Secondary | ICD-10-CM

## 2022-12-12 DIAGNOSIS — M86171 Other acute osteomyelitis, right ankle and foot: Secondary | ICD-10-CM

## 2022-12-12 DIAGNOSIS — N186 End stage renal disease: Secondary | ICD-10-CM

## 2022-12-12 DIAGNOSIS — I509 Heart failure, unspecified: Secondary | ICD-10-CM

## 2022-12-12 DIAGNOSIS — L02611 Cutaneous abscess of right foot: Secondary | ICD-10-CM | POA: Diagnosis not present

## 2022-12-12 DIAGNOSIS — L03031 Cellulitis of right toe: Secondary | ICD-10-CM | POA: Diagnosis not present

## 2022-12-12 DIAGNOSIS — Z89429 Acquired absence of other toe(s), unspecified side: Secondary | ICD-10-CM

## 2022-12-12 HISTORY — PX: AMPUTATION: SHX166

## 2022-12-12 LAB — GLUCOSE, CAPILLARY
Glucose-Capillary: 74 mg/dL (ref 70–99)
Glucose-Capillary: 48 mg/dL — ABNORMAL LOW (ref 70–99)
Glucose-Capillary: 79 mg/dL (ref 70–99)
Glucose-Capillary: 110 mg/dL — ABNORMAL HIGH (ref 70–99)
Glucose-Capillary: 113 mg/dL — ABNORMAL HIGH (ref 70–99)

## 2022-12-12 LAB — AEROBIC/ANAEROBIC CULTURE W GRAM STAIN (SURGICAL/DEEP WOUND)

## 2022-12-12 SURGERY — AMPUTATION, FOOT, RAY
Anesthesia: Monitor Anesthesia Care | Laterality: Right

## 2022-12-12 MED ORDER — PHENYLEPHRINE 80 MCG/ML (10ML) SYRINGE FOR IV PUSH (FOR BLOOD PRESSURE SUPPORT)
PREFILLED_SYRINGE | INTRAVENOUS | Status: AC
  Filled 2022-12-12: qty 10

## 2022-12-12 MED ORDER — LIDOCAINE 2% (20 MG/ML) 5 ML SYRINGE
INTRAMUSCULAR | Status: DC | PRN
  Administered 2022-12-12: 100 mg via INTRAVENOUS

## 2022-12-12 MED ORDER — PHENYLEPHRINE 80 MCG/ML (10ML) SYRINGE FOR IV PUSH (FOR BLOOD PRESSURE SUPPORT)
PREFILLED_SYRINGE | INTRAVENOUS | Status: DC | PRN
  Administered 2022-12-12 (×2): 160 ug via INTRAVENOUS
  Administered 2022-12-12 (×2): 240 ug via INTRAVENOUS

## 2022-12-12 MED ORDER — PHENYLEPHRINE HCL-NACL 20-0.9 MG/250ML-% IV SOLN
INTRAVENOUS | Status: DC | PRN
  Administered 2022-12-12: 25 ug/min via INTRAVENOUS

## 2022-12-12 MED ORDER — MIDAZOLAM HCL 2 MG/2ML IJ SOLN
INTRAMUSCULAR | Status: DC | PRN
  Administered 2022-12-12: 2 mg via INTRAVENOUS

## 2022-12-12 MED ORDER — CHLORHEXIDINE GLUCONATE CLOTH 2 % EX PADS
6.0000 | MEDICATED_PAD | Freq: Once | CUTANEOUS | Status: AC
  Administered 2022-12-12: 6 via TOPICAL

## 2022-12-12 MED ORDER — FENTANYL CITRATE (PF) 100 MCG/2ML IJ SOLN
INTRAMUSCULAR | Status: AC
  Administered 2022-12-12: 25 ug via INTRAVENOUS
  Filled 2022-12-12: qty 2

## 2022-12-12 MED ORDER — ALBUMIN HUMAN 5 % IV SOLN: 12.5000 g | Freq: Once | INTRAVENOUS | Status: AC

## 2022-12-12 MED ORDER — LIDOCAINE HCL 1 % IJ SOLN
INTRAMUSCULAR | Status: AC
  Filled 2022-12-12: qty 20

## 2022-12-12 MED ORDER — OXYCODONE HCL 5 MG/5ML PO SOLN: 5.0000 mg | Freq: Once | ORAL | Status: DC | PRN

## 2022-12-12 MED ORDER — BUPIVACAINE HCL (PF) 0.25 % IJ SOLN
INTRAMUSCULAR | Status: AC
  Filled 2022-12-12: qty 30

## 2022-12-12 MED ORDER — MIDAZOLAM HCL 2 MG/2ML IJ SOLN
INTRAMUSCULAR | Status: AC
  Filled 2022-12-12: qty 2

## 2022-12-12 MED ORDER — 0.9 % SODIUM CHLORIDE (POUR BTL) OPTIME
TOPICAL | Status: DC | PRN
  Administered 2022-12-12: 1000 mL

## 2022-12-12 MED ORDER — SODIUM CHLORIDE 0.9 % IR SOLN
Status: DC | PRN
  Administered 2022-12-12: 3000 mL

## 2022-12-12 MED ORDER — BUPIVACAINE HCL (PF) 0.5 % IJ SOLN
INTRAMUSCULAR | Status: AC
  Filled 2022-12-12: qty 30

## 2022-12-12 MED ORDER — BUPIVACAINE HCL (PF) 0.5 % IJ SOLN
INTRAMUSCULAR | Status: DC | PRN
  Administered 2022-12-12: 10 mL

## 2022-12-12 MED ORDER — LIDOCAINE 2% (20 MG/ML) 5 ML SYRINGE
INTRAMUSCULAR | Status: AC
  Filled 2022-12-12: qty 5

## 2022-12-12 MED ORDER — LIDOCAINE HCL 2 % IJ SOLN
INTRAMUSCULAR | Status: DC | PRN
  Administered 2022-12-12: 10 mL

## 2022-12-12 MED ORDER — OXYCODONE HCL 5 MG PO TABS: 5.0000 mg | ORAL_TABLET | Freq: Once | ORAL | Status: DC | PRN

## 2022-12-12 MED ORDER — PROPOFOL 500 MG/50ML IV EMUL
INTRAVENOUS | Status: DC | PRN
  Administered 2022-12-12: 150 ug/kg/min via INTRAVENOUS

## 2022-12-12 MED ORDER — PROPOFOL 10 MG/ML IV BOLUS
INTRAVENOUS | Status: AC
  Filled 2022-12-12: qty 20

## 2022-12-12 MED ORDER — DEXTROSE 50 % IV SOLN: 1.0000 | Freq: Once | INTRAVENOUS | Status: DC

## 2022-12-12 MED ORDER — ALBUMIN HUMAN 5 % IV SOLN
INTRAVENOUS | Status: AC
  Administered 2022-12-12: 12.5 g via INTRAVENOUS
  Filled 2022-12-12: qty 250

## 2022-12-12 MED ORDER — DEXTROSE 5 % IV BOLUS
500.0000 mL | Freq: Once | INTRAVENOUS | Status: AC
  Administered 2022-12-12: 500 mL via INTRAVENOUS

## 2022-12-12 MED ORDER — FENTANYL CITRATE (PF) 250 MCG/5ML IJ SOLN
INTRAMUSCULAR | Status: AC
  Filled 2022-12-12: qty 5

## 2022-12-12 MED ORDER — PROMETHAZINE HCL 25 MG/ML IJ SOLN: 6.2500 mg | INTRAMUSCULAR | Status: DC | PRN

## 2022-12-12 MED ORDER — LIDOCAINE-EPINEPHRINE 1 %-1:100000 IJ SOLN
INTRAMUSCULAR | Status: AC
  Filled 2022-12-12: qty 1

## 2022-12-12 MED ORDER — FENTANYL CITRATE (PF) 100 MCG/2ML IJ SOLN: 25.0000 ug | INTRAMUSCULAR | Status: DC | PRN

## 2022-12-12 SURGICAL SUPPLY — 57 items
APL PRP STRL LF DISP 70% ISPRP (MISCELLANEOUS) ×1
BAG COUNTER SPONGE SURGICOUNT (BAG) ×2 IMPLANT
BAG SPNG CNTER NS LX DISP (BAG) ×1
BLADE LONG MED 31X9 (MISCELLANEOUS) IMPLANT
BLADE SAW SGTL 83.5X18.5 (BLADE) IMPLANT
BNDG CMPR 5X6 CHSV STRCH STRL (GAUZE/BANDAGES/DRESSINGS)
BNDG CMPR 9X4 STRL LF SNTH (GAUZE/BANDAGES/DRESSINGS)
BNDG COHESIVE 6X5 TAN NS LF (GAUZE/BANDAGES/DRESSINGS) IMPLANT
BNDG COHESIVE 6X5 TAN ST LF (GAUZE/BANDAGES/DRESSINGS) IMPLANT
BNDG ELASTIC 4X5.8 VLCR STR LF (GAUZE/BANDAGES/DRESSINGS) IMPLANT
BNDG ESMARK 4X9 LF (GAUZE/BANDAGES/DRESSINGS) IMPLANT
BNDG GAUZE DERMACEA FLUFF 4 (GAUZE/BANDAGES/DRESSINGS) IMPLANT
BNDG GZE DERMACEA 4 6PLY (GAUZE/BANDAGES/DRESSINGS)
CHLORAPREP W/TINT 26 (MISCELLANEOUS) ×2 IMPLANT
COVER SURGICAL LIGHT HANDLE (MISCELLANEOUS) ×2 IMPLANT
CUFF TOURN SGL QUICK 18X4 (TOURNIQUET CUFF) IMPLANT
ELECT REM PT RETURN 9FT ADLT (ELECTROSURGICAL) ×1
ELECTRODE REM PT RTRN 9FT ADLT (ELECTROSURGICAL) ×2 IMPLANT
GAUZE PACKING IODOFORM 1/2INX (GAUZE/BANDAGES/DRESSINGS) IMPLANT
GAUZE PAD ABD 8X10 STRL (GAUZE/BANDAGES/DRESSINGS) IMPLANT
GAUZE SPONGE 4X4 12PLY STRL (GAUZE/BANDAGES/DRESSINGS) IMPLANT
GAUZE SPONGE 4X4 12PLY STRL LF (GAUZE/BANDAGES/DRESSINGS) IMPLANT
GAUZE XEROFORM 1X8 LF (GAUZE/BANDAGES/DRESSINGS) IMPLANT
GAUZE XEROFORM 5X9 LF (GAUZE/BANDAGES/DRESSINGS) IMPLANT
GLOVE BIO SURGEON STRL SZ7.5 (GLOVE) ×2 IMPLANT
GLOVE BIOGEL PI IND STRL 7.5 (GLOVE) ×2 IMPLANT
GOWN STRL REUS W/ TWL LRG LVL3 (GOWN DISPOSABLE) ×4 IMPLANT
GOWN STRL REUS W/TWL LRG LVL3 (GOWN DISPOSABLE) ×2
HANDPIECE INTERPULSE COAX TIP (DISPOSABLE) ×1
KIT BASIN OR (CUSTOM PROCEDURE TRAY) ×2 IMPLANT
KIT TURNOVER KIT B (KITS) ×2 IMPLANT
MANIFOLD NEPTUNE II (INSTRUMENTS) ×2 IMPLANT
NDL HYPO 25GX1X1/2 BEV (NEEDLE) ×2 IMPLANT
NEEDLE HYPO 25GX1X1/2 BEV (NEEDLE) ×1 IMPLANT
NS IRRIG 1000ML POUR BTL (IV SOLUTION) ×2 IMPLANT
PACK ORTHO EXTREMITY (CUSTOM PROCEDURE TRAY) ×2 IMPLANT
PAD ABD 8X10 STRL (GAUZE/BANDAGES/DRESSINGS) IMPLANT
PAD ARMBOARD 7.5X6 YLW CONV (MISCELLANEOUS) ×4 IMPLANT
PAD CAST 4YDX4 CTTN HI CHSV (CAST SUPPLIES) IMPLANT
PADDING CAST ABS COTTON 4X4 ST (CAST SUPPLIES) IMPLANT
PADDING CAST COTTON 4X4 STRL (CAST SUPPLIES)
PADDING CAST COTTON 6X4 STRL (CAST SUPPLIES) IMPLANT
SET HNDPC FAN SPRY TIP SCT (DISPOSABLE) IMPLANT
SOL PREP POV-IOD 4OZ 10% (MISCELLANEOUS) ×2 IMPLANT
SOL SCRUB PVP POV-IOD 4OZ 7.5% (MISCELLANEOUS) ×1
SOLUTION SCRB POV-IOD 4OZ 7.5% (MISCELLANEOUS) ×2 IMPLANT
STAPLER VISISTAT 35W (STAPLE) IMPLANT
SUT ETHILON 3 0 FSL (SUTURE) IMPLANT
SUT ETHILON 3 0 PS 1 (SUTURE) IMPLANT
SUT PROLENE 3 0 PS 2 (SUTURE) IMPLANT
SWAB COLLECTION DEVICE MRSA (MISCELLANEOUS) ×2 IMPLANT
SWAB CULTURE ESWAB REG 1ML (MISCELLANEOUS) ×2 IMPLANT
SYR CONTROL 10ML LL (SYRINGE) ×2 IMPLANT
TOWEL GREEN STERILE (TOWEL DISPOSABLE) ×2 IMPLANT
TOWEL GREEN STERILE FF (TOWEL DISPOSABLE) ×2 IMPLANT
TUBE CONNECTING 12X1/4 (SUCTIONS) ×2 IMPLANT
YANKAUER SUCT BULB TIP NO VENT (SUCTIONS) ×2 IMPLANT

## 2022-12-12 NOTE — Transfer of Care (Signed)
Immediate Anesthesia Transfer of Care Note  Patient: [name]  Procedure(s) Performed: AMPUTATION RAY (Right)  Patient Location: PACU  Anesthesia Type:MAC  Level of Consciousness: lethargic  Airway & Oxygen Therapy: Patient Spontanous Breathing  Post-op Assessment: Report given to RN  Post vital signs: Reviewed  Last Vitals:  Vitals Value Taken Time  BP 69/52 12/12/22 0839  Temp    Pulse 49 12/12/22 0841  Resp 25 12/12/22 0841  SpO2 96 % 12/12/22 0841  Vitals shown include unvalidated device data.  Last Pain:  Vitals:   12/12/22 0500  TempSrc: Oral  PainSc:       Patients Stated Pain Goal: 2 (12/08/22 1611)  Complications: No notable events documented.

## 2022-12-12 NOTE — Progress Notes (Signed)
Subjective:  He is complaining of pain and bleeding at the operative site  Antibiotics:  Anti-infectives (From admission, onward)    Start     Dose/Rate Route Frequency Ordered Stop   12/11/22 1245  metroNIDAZOLE (FLAGYL) tablet 500 mg        500 mg Oral Every 12 hours 12/11/22 1235     12/09/22 1800  ceFEPIme (MAXIPIME) 2 g in sodium chloride 0.9 % 100 mL IVPB  Status:  Discontinued        2 g 200 mL/hr over 30 Minutes Intravenous Once per day on Tue Thu Sat 12/09/22 1327 12/10/22 1309   12/09/22 1200  vancomycin (VANCOREADY) IVPB 1500 mg/300 mL        1,500 mg 150 mL/hr over 120 Minutes Intravenous Every T-Th-Sa (Hemodialysis) 12/06/22 1710     12/06/22 1200  ceFEPIme (MAXIPIME) 1 g in sodium chloride 0.9 % 100 mL IVPB  Status:  Discontinued        1 g 200 mL/hr over 30 Minutes Intravenous Every 24 hours 12/06/22 1157 12/09/22 1327   12/06/22 1200  vancomycin (VANCOCIN) IVPB 1000 mg/200 mL premix       See Hyperspace for full Linked Orders Report.   1,000 mg 200 mL/hr over 60 Minutes Intravenous  Once 12/06/22 1157 12/06/22 1442   12/06/22 1200  vancomycin (VANCOREADY) IVPB 1500 mg/300 mL       See Hyperspace for full Linked Orders Report.   1,500 mg 150 mL/hr over 120 Minutes Intravenous  Once 12/06/22 1157 12/06/22 1650       Medications: Scheduled Meds:  atorvastatin  40 mg Oral Daily   Chlorhexidine Gluconate Cloth  6 each Topical Daily   darbepoetin (ARANESP) injection - DIALYSIS  60 mcg Subcutaneous Q Thu-1800   dextrose  1 ampule Intravenous Once   doxercalciferol  6 mcg Intravenous Q T,Th,Sa-HD   ferric citrate  630 mg Oral TID WC   insulin aspart  0-6 Units Subcutaneous Q4H   metroNIDAZOLE  500 mg Oral Q12H   midodrine  15 mg Oral Q8H   oxybutynin  5 mg Oral TID   Continuous Infusions:  sodium chloride 0  (12/08/22 1726)   vancomycin 1,500 mg (12/11/22 1728)   PRN Meds:.acetaminophen, docusate sodium, HYDROmorphone (DILAUDID) injection,  ondansetron (ZOFRAN) IV, ondansetron **OR** [DISCONTINUED] ondansetron (ZOFRAN) IV, mouth rinse, oxyCODONE, polyethylene glycol    Objective: Weight change: 2.1 kg  Intake/Output Summary (Last 24 hours) at 12/12/2022 1833 Last data filed at 12/12/2022 1400 Gross per 24 hour  Intake 460 ml  Output 100 ml  Net 360 ml   Blood pressure (!) 91/59, pulse 76, temperature 98.7 F (37.1 C), temperature source Oral, resp. rate 20, height 6\' 2"  (1.88 m), weight (!) 142.1 kg, SpO2 98 %. Temp:  [97.6 F (36.4 C)-99.5 F (37.5 C)] 98.7 F (37.1 C) (07/05 1630) Pulse Rate:  [48-82] 76 (07/05 1721) Resp:  [13-21] 20 (07/05 1721) BP: (69-95)/(34-64) 91/59 (07/05 1721) SpO2:  [90 %-100 %] 98 % (07/05 1721) Weight:  [142.1 kg] 142.1 kg (07/05 0500)  Physical Exam: Physical Exam Constitutional:      Appearance: He is well-developed.  HENT:     Head: Normocephalic and atraumatic.  Eyes:     Conjunctiva/sclera: Conjunctivae normal.  Cardiovascular:     Rate and Rhythm: Normal rate and regular rhythm.  Pulmonary:     Effort: Pulmonary effort is normal. No respiratory distress.     Breath sounds: No  wheezing.  Abdominal:     General: There is no distension.     Palpations: Abdomen is soft.  Musculoskeletal:     Cervical back: Normal range of motion and neck supple.  Skin:    General: Skin is warm and dry.     Findings: No erythema or rash.  Neurological:     General: No focal deficit present.     Mental Status: He is alert and oriented to person, place, and time.  Psychiatric:        Mood and Affect: Mood normal.        Behavior: Behavior normal.        Thought Content: Thought content normal.        Judgment: Judgment normal.    Right foot bandaged  CBC:    BMET Recent Labs    12/10/22 0046 12/11/22 0915  NA 129* 131*  K 5.1 5.2*  CL 91* 91*  CO2 22 22  GLUCOSE 88 103*  BUN 58* 80*  CREATININE 13.67* 15.98*  CALCIUM 9.5 9.8     Liver Panel  Recent Labs     12/11/22 0915  ALBUMIN 2.5*       Sedimentation Rate No results for input(s): "ESRSEDRATE" in the last 72 hours. C-Reactive Protein No results for input(s): "CRP" in the last 72 hours.  Micro Results: Recent Results (from the past 720 hour(s))  Blood culture (routine x 2)     Status: None   Collection Time: 12/06/22 12:20 PM   Specimen: BLOOD LEFT HAND  Result Value Ref Range Status   Specimen Description BLOOD LEFT HAND  Final   Special Requests   Final    BOTTLES DRAWN AEROBIC ONLY Blood Culture results may not be optimal due to an inadequate volume of blood received in culture bottles   Culture   Final    NO GROWTH 5 DAYS Performed at [facility], [address], [place], [place] [address]    Report Status 12/11/2022 FINAL  Final  Blood culture (routine x 2)     Status: Abnormal   Collection Time: 12/06/22 12:45 PM   Specimen: BLOOD  Result Value Ref Range Status   Specimen Description BLOOD SITE NOT SPECIFIED  Final   Special Requests   Final    BOTTLES DRAWN AEROBIC AND ANAEROBIC Blood Culture results may not be optimal due to an inadequate volume of blood received in culture bottles   Culture  Setup Time   Final    GRAM POSITIVE COCCI AEROBIC BOTTLE ONLY CRITICAL RESULT CALLED TO, READ BACK BY AND VERIFIED WITH: PHARMD LAUREN BELL [id number] AT 1730 BY EC    Culture (A)  Final    STAPHYLOCOCCUS EPIDERMIDIS THE SIGNIFICANCE OF ISOLATING THIS ORGANISM FROM A SINGLE VENIPUNCTURE CANNOT BE PREDICTED WITHOUT FURTHER CLINICAL AND CULTURE CORRELATION. SUSCEPTIBILITIES AVAILABLE ONLY ON REQUEST. Performed at [facility], [address], [place], [place] [address]    Report Status 12/08/2022 FINAL  Final  Blood Culture ID Panel (Reflexed)     Status: Abnormal   Collection Time: 12/06/22 12:45 PM  Result Value Ref Range Status   Enterococcus faecalis NOT DETECTED NOT DETECTED Final   Enterococcus Faecium NOT DETECTED NOT DETECTED Final   Listeria  monocytogenes NOT DETECTED NOT DETECTED Final   Staphylococcus species DETECTED (A) NOT DETECTED Final    Comment: CRITICAL RESULT CALLED TO, READ BACK BY AND VERIFIED WITH: PHARMD LAUREN BELL [id number] AT 1730 BY EC    Staphylococcus  aureus (BCID) NOT DETECTED NOT DETECTED Final   Staphylococcus epidermidis DETECTED (A) NOT DETECTED Final    Comment: Methicillin (oxacillin) resistant coagulase negative staphylococcus. Possible blood culture contaminant (unless isolated from more than one blood culture draw or clinical case suggests pathogenicity). No antibiotic treatment is indicated for blood  culture contaminants. REPEAT WITH NEW VIAL OF CTRL PHARMD LAUREN BELL [id number] AT 1730 BY EC    Staphylococcus lugdunensis NOT DETECTED NOT DETECTED Final   Streptococcus species NOT DETECTED NOT DETECTED Final   Streptococcus agalactiae NOT DETECTED NOT DETECTED Final   Streptococcus pneumoniae NOT DETECTED NOT DETECTED Final   Streptococcus pyogenes NOT DETECTED NOT DETECTED Final   A.calcoaceticus-baumannii NOT DETECTED NOT DETECTED Final   Bacteroides fragilis NOT DETECTED NOT DETECTED Final   Enterobacterales NOT DETECTED NOT DETECTED Final   Enterobacter cloacae complex NOT DETECTED NOT DETECTED Final   Escherichia coli NOT DETECTED NOT DETECTED Final   Klebsiella aerogenes NOT DETECTED NOT DETECTED Final   Klebsiella oxytoca NOT DETECTED NOT DETECTED Final   Klebsiella pneumoniae NOT DETECTED NOT DETECTED Final   Proteus species NOT DETECTED NOT DETECTED Final   Salmonella species NOT DETECTED NOT DETECTED Final   Serratia marcescens NOT DETECTED NOT DETECTED Final   Haemophilus influenzae NOT DETECTED NOT DETECTED Final   Neisseria meningitidis NOT DETECTED NOT DETECTED Final   Pseudomonas aeruginosa NOT DETECTED NOT DETECTED Final   Stenotrophomonas maltophilia NOT DETECTED NOT DETECTED Final   Candida albicans NOT DETECTED NOT DETECTED Final   Candida auris NOT DETECTED NOT DETECTED  Final   Candida glabrata NOT DETECTED NOT DETECTED Final   Candida krusei NOT DETECTED NOT DETECTED Final   Candida parapsilosis NOT DETECTED NOT DETECTED Final   Candida tropicalis NOT DETECTED NOT DETECTED Final   Cryptococcus neoformans/gattii NOT DETECTED NOT DETECTED Final   Methicillin resistance mecA/C DETECTED (A) NOT DETECTED Final    Comment: CRITICAL RESULT CALLED TO, READ BACK BY AND VERIFIED WITH: Chestine Spore [id number] AT 1730 BY EC Performed at [facility], [address], [place], [place] [address]   MRSA Next Gen by PCR, Nasal     Status: Abnormal   Collection Time: 12/06/22  4:33 PM   Specimen: Nasal Mucosa; Nasal Swab  Result Value Ref Range Status   MRSA by PCR Next Gen DETECTED (A) NOT DETECTED Final    Comment: RESULT CALLED TO, READ BACK BY AND VERIFIED WITH: 12/06/2022 AT 1836 TO RN Donney Rankins., ADC (NOTE) The GeneXpert MRSA Assay (FDA approved for NASAL specimens only), is one component of a comprehensive MRSA colonization surveillance program. It is not intended to diagnose MRSA infection nor to guide or monitor treatment for MRSA infections. Test performance is not FDA approved in patients less than 38 years old. Performed at [facility], [address] [place], [place] [address]   Aerobic/Anaerobic Culture w Gram Stain (surgical/deep wound)     Status: None   Collection Time: 12/08/22  5:12 PM   Specimen: Path fluid; Tissue  Result Value Ref Range Status   Specimen Description WOUND  Final   Special Requests RIGHT FOOT  Final   Gram Stain   Final    RARE WBC PRESENT, PREDOMINANTLY PMN NO ORGANISMS SEEN    Culture   Final    MODERATE METHICILLIN RESISTANT STAPHYLOCOCCUS AUREUS MODERATE BACTEROIDES FRAGILIS BETA LACTAMASE POSITIVE Performed at [facility], [address], [place], [place] [address]    Report Status 12/11/2022 FINAL  Final  Organism ID, Bacteria METHICILLIN RESISTANT STAPHYLOCOCCUS AUREUS  Final       Susceptibility   Methicillin resistant staphylococcus aureus - MIC*    CIPROFLOXACIN <=0.5 SENSITIVE Sensitive     ERYTHROMYCIN >=8 RESISTANT Resistant     GENTAMICIN <=0.5 SENSITIVE Sensitive     OXACILLIN >=4 RESISTANT Resistant     TETRACYCLINE <=1 SENSITIVE Sensitive     VANCOMYCIN 1 SENSITIVE Sensitive     TRIMETH/SULFA <=10 SENSITIVE Sensitive     CLINDAMYCIN <=0.25 SENSITIVE Sensitive     RIFAMPIN <=0.5 SENSITIVE Sensitive     Inducible Clindamycin NEGATIVE Sensitive     LINEZOLID 2 SENSITIVE Sensitive     * MODERATE METHICILLIN RESISTANT STAPHYLOCOCCUS AUREUS  Aerobic/Anaerobic Culture w Gram Stain (surgical/deep wound)     Status: None   Collection Time: 12/08/22  5:21 PM   Specimen: Path fluid; Tissue  Result Value Ref Range Status   Specimen Description WOUND  Final   Special Requests FIRST METATARSAL  Final   Gram Stain NO WBC SEEN NO ORGANISMS SEEN   Final   Culture   Final    FEW METHICILLIN RESISTANT STAPHYLOCOCCUS AUREUS FEW BACTEROIDES FRAGILIS BETA LACTAMASE POSITIVE Performed at [facility], [address], [place], [place] [address]    Report Status 12/11/2022 FINAL  Final   Organism ID, Bacteria METHICILLIN RESISTANT STAPHYLOCOCCUS AUREUS  Final      Susceptibility   Methicillin resistant staphylococcus aureus - MIC*    CIPROFLOXACIN <=0.5 SENSITIVE Sensitive     ERYTHROMYCIN >=8 RESISTANT Resistant     GENTAMICIN <=0.5 SENSITIVE Sensitive     OXACILLIN >=4 RESISTANT Resistant     TETRACYCLINE <=1 SENSITIVE Sensitive     VANCOMYCIN 1 SENSITIVE Sensitive     TRIMETH/SULFA <=10 SENSITIVE Sensitive     CLINDAMYCIN <=0.25 SENSITIVE Sensitive     RIFAMPIN <=0.5 SENSITIVE Sensitive     Inducible Clindamycin NEGATIVE Sensitive     LINEZOLID 2 SENSITIVE Sensitive     * FEW METHICILLIN RESISTANT STAPHYLOCOCCUS AUREUS  Aerobic/Anaerobic Culture w Gram Stain (surgical/deep wound)     Status: None (Preliminary result)   Collection Time: 12/12/22  9:28 AM    Specimen: Foot, Right; Amputation  Result Value Ref Range Status   Specimen Description TOE  Final   Special Requests RT FOOT  Final   Gram Stain   Final    NO WBC SEEN NO ORGANISMS SEEN Performed at [facility], [address] [place], [place] [address]    Culture PENDING  Incomplete   Report Status PENDING  Incomplete    Studies/Results: DG Foot Complete Right  Result Date: 12/12/2022 CLINICAL DATA:  Osteomyelitis of right foot.  PACU wound check. EXAM: RIGHT FOOT COMPLETE - 3+ VIEW COMPARISON:  Right foot radiographs 12/08/2022; MRI right forefoot 11/26/2022 FINDINGS: Interval amputation of the first ray to the level of the mid shaft. There is subcutaneous air within the postsurgical distal surgical site, likely within normal limits recent surgery. The first metatarsal amputation margin is sharp. Mild plantar calcaneal heel spur. IMPRESSION: Interval amputation of the first ray to the level of the mid shaft. No complication is seen. Electronically Signed   By: Neita Garnet M.D.   On: 12/12/2022 11:29      Assessment/Plan:  INTERVAL HISTORY: Patient is status post first ray amputation with new cultures obtained   Active Problems:   Essential hypertension   ESRD on dialysis [facility])   Acute osteomyelitis of right foot (HCC)  Sepsis (HCC)   Ulcer of right foot (HCC)   Cellulitis and abscess of toe of right foot    [name] is a 47 y.o. male with end-stage renal disease on hemodialysis Tuesday Thursday Saturday paroxysmal atrial fibrillation catheter associated thrombosis on Eliquis chronic heart failure pulmonary hypertension admitted with foot abscess status post I&D with MRSA and Bacteroides fragilis isolated.  While imaging not show osteomyelitis there was concern for this and he underwent first ray amputation today.  New cultures have been obtained  #1 Foot abscess with possible osteomyelitis status post first ray amputation:  Unless new pathogens are  identified not covered by the current antibiotics I would recommend that he get vancomycin with dialysis x [redacted] weeks along with oral metronidazole twice daily.  I have personally spent 52 minutes involved in face-to-face and non-face-to-face activities for this patient on the day of the visit. Professional time spent includes the following activities: Preparing to see the patient (review of tests), Obtaining and/or reviewing separately obtained history (admission/discharge record), Performing a medically appropriate examination and/or evaluation , Ordering medications/tests/procedures, referring and communicating with other health care professionals, Documenting clinical information in the EMR, Independently interpreting results (not separately reported), Communicating results to the patient/family/caregiver, Counseling and educating the patient/family/caregiver and Care coordination (not separately reported).     Kyreese Ansell Luecke has an appointment on 01/13/2023 with Dr. Luciana Axe at [facility] at  [facility], which  is located in the [facility] at  [address] in [place].  [address], which is located to the left of the elevators.  Phone: [phone]  Fax: [phone]  [email or web address]  The patient should arrive 30 minutes prior to their appoitment.    LOS: 6 days   Acey Lav 12/12/2022, 6:33 PM

## 2022-12-12 NOTE — Progress Notes (Signed)
   12/12/22 1430  Assess: MEWS Score  BP (!) 72/44  MAP (mmHg) (!) 54  Pulse Rate 80  ECG Heart Rate 81  Resp 18  Level of Consciousness Alert  SpO2 95 %  O2 Device Nasal Cannula  O2 Flow Rate (L/min) 2 L/min  Assess: MEWS Score  MEWS Temp 0  MEWS Systolic 2  MEWS Pulse 0  MEWS RR 0  MEWS LOC 0  MEWS Score 2  MEWS Score Color Yellow  Assess: if the MEWS score is Yellow or Red  Were vital signs taken at a resting state? Yes  Focused Assessment No change from prior assessment  Does the patient meet 2 or more of the SIRS criteria? No  MEWS guidelines implemented  Yes, yellow  Treat  MEWS Interventions Considered administering scheduled or prn medications/treatments as ordered  Take Vital Signs  Increase Vital Sign Frequency  Yellow: Q2hr x1, continue Q4hrs until patient remains green for 12hrs  Escalate  MEWS: Escalate Yellow: Discuss with charge nurse and consider notifying provider and/or RRT  Notify: Charge Nurse/RN  Name of Charge Nurse/RN Notified Bryan  Assess: SIRS CRITERIA  SIRS Temperature  0  SIRS Pulse 0  SIRS Respirations  0  SIRS WBC 0  SIRS Score Sum  0

## 2022-12-12 NOTE — Progress Notes (Signed)
[facility] Progress Note   Subjective:  Seen in room. Completed dialysis yesterday with 3.3L UF. Went to OR this morning for right 1st ray amputation. Feels "horrible" c/o foot pain/throbbing. This am is hypotensive as well, says not unusual for him.   Objective Vitals:   12/12/22 1045 12/12/22 1100 12/12/22 1201 12/12/22 1216  BP: (!) 82/43 (!) 90/52 (!) 70/34 (!) 77/62  Pulse: (!) 54 (!) 48 68   Resp: 15 16 16  (!) 21  Temp:      TempSrc:      SpO2: 94% 100% 100% 100%  Weight:      Height:       Physical Exam General: Well appearing man, NAD. Room air Heart: RRR; no murmur Lungs: CTA anteriorly Abdomen: soft Extremities: Trace LE edema, R foot bandaged Dialysis Access: L femoral [facility]  Additional Objective Labs: Basic Metabolic Panel: Recent Labs  Lab 12/07/22 0646 12/08/22 0049 12/09/22 0127 12/10/22 0046 12/11/22 0915  NA 132*   < > 130* 129* 131*  K 4.9   < > 5.5* 5.1 5.2*  CL 92*   < > 90* 91* 91*  CO2 23   < > 20* 22 22  GLUCOSE 125*   < > 106* 88 103*  BUN 49*   < > 76* 58* 80*  CREATININE 14.47*   < > 17.52* 13.67* 15.98*  CALCIUM 9.3   < > 9.5 9.5 9.8  PHOS 6.3*  --   --   --  7.4*   < > = values in this interval not displayed.    Liver Function Tests: Recent Labs  Lab 12/06/22 1053 12/11/22 0915  AST 26  --   ALT 31  --   ALKPHOS 58  --   BILITOT 1.1  --   PROT 8.5*  --   ALBUMIN 3.8 2.5*    CBC: Recent Labs  Lab 12/06/22 1053 12/07/22 0646 12/08/22 1633 12/09/22 0127 12/10/22 0046 12/11/22 0915  WBC 14.4* 16.1*  --  15.0* 13.7* 13.9*  NEUTROABS 11.3*  --   --   --   --   --   HGB 12.6* 10.5*   < > 9.6* 9.6* 9.4*  HCT 39.8 32.9*   < > 30.0* 30.1* 29.3*  MCV 92.8 94.5  --  91.7 89.9 92.1  PLT 189 182  --  218 231 295   < > = values in this interval not displayed.    Blood Culture    Component Value Date/Time   SDES WOUND 12/08/2022 1721   SPECREQUEST FIRST METATARSAL 12/08/2022 1721   CULT  12/08/2022 1721     FEW METHICILLIN RESISTANT STAPHYLOCOCCUS AUREUS FEW BACTEROIDES FRAGILIS BETA LACTAMASE POSITIVE Performed at [facility], [address], [place], [place] [address]    REPTSTATUS 12/11/2022 FINAL 12/08/2022 1721   Studies/Results: DG Foot Complete Right  Result Date: 12/12/2022 CLINICAL DATA:  Osteomyelitis of right foot.  PACU wound check. EXAM: RIGHT FOOT COMPLETE - 3+ VIEW COMPARISON:  Right foot radiographs 12/08/2022; MRI right forefoot 11/26/2022 FINDINGS: Interval amputation of the first ray to the level of the mid shaft. There is subcutaneous air within the postsurgical distal surgical site, likely within normal limits recent surgery. The first metatarsal amputation margin is sharp. Mild plantar calcaneal heel spur. IMPRESSION: Interval amputation of the first ray to the level of the mid shaft. No complication is seen. Electronically Signed   By: Neita Garnet M.D.   On: 12/12/2022 11:29  VAS [place] ABI WITH/WO TBI  Result Date: 12/10/2022  LOWER EXTREMITY DOPPLER STUDY Patient Name:  [name]  Date of Exam:   12/10/2022 Medical Rec #: [id number]          Accession #:    [id number] Date of Birth: June 11, 1975          Patient Gender: M Patient Age:   47 years Exam Location:  [facility] Procedure:      VAS [place] ABI WITH/WO TBI Referring Phys: REBECCA SIKORA --------------------------------------------------------------------------------  Indications: Cellultis/RT foot infection, LT great toe ulceration High Risk Factors: Hypertension, hyperlipidemia, past history of smoking. Other Factors: ESRD (HD- LLE), CHF, Afib.  Limitations: Today's exam was limited due to bandages and skin texture              (thickened, hard), venous interference. Comparison Study: Previous exam on 07/15/2022 was WNL Performing Technologist: Ernestene Mention RVT, RDMS  Examination Guidelines: A complete evaluation includes at minimum, Doppler waveform signals and systolic blood pressure reading at the level of  bilateral brachial, anterior tibial, and posterior tibial arteries, when vessel segments are accessible. Bilateral testing is considered an integral part of a complete examination. Photoelectric Plethysmograph (PPG) waveforms and toe systolic pressure readings are included as required and additional duplex testing as needed. Limited examinations for reoccurring indications may be performed as noted.  ABI Findings: +--------+------------------+-----+-----------+-----------------+ Right   Rt Pressure (mmHg)IndexWaveform   Comment           +--------+------------------+-----+-----------+-----------------+ [id number]                    triphasic                    +--------+------------------+-----+-----------+-----------------+ PTA     111               1.06 multiphasic                  +--------+------------------+-----+-----------+-----------------+ DP      104               0.99 triphasic  audibly triphasic +--------+------------------+-----+-----------+-----------------+ +---------+------------------+-----+---------+-----------------+ Left     Lt Pressure (mmHg)IndexWaveform Comment           +---------+------------------+-----+---------+-----------------+ Brachial 84                     triphasic                  +---------+------------------+-----+---------+-----------------+ PTA      101               0.96 triphasic                  +---------+------------------+-----+---------+-----------------+ DP       97                0.92 triphasicaudibly triphasic +---------+------------------+-----+---------+-----------------+ Great Toe120               1.14 Normal                     +---------+------------------+-----+---------+-----------------+ +-------+-----------+-----------+------------+------------+ ABI/TBIToday's ABIToday's TBIPrevious ABIPrevious TBI +-------+-----------+-----------+------------+------------+ Right  1.06       bandaged   1.18                      +-------+-----------+-----------+------------+------------+ Left   0.96       1.14       0.99                     +-------+-----------+-----------+------------+------------+  Very difficult obtaining doppler signals due to venous interference.  Summary: Right: Resting right ankle-brachial index is within normal range. Unable to obtain TBI due to wound/bandage. Left: Resting left ankle-brachial index is within normal range. The left toe-brachial index is normal. Great toe too large for cuff, second toe used. *See table(s) above for measurements and observations.  Electronically signed by Coral Else MD on 12/10/2022 at 9:19:21 PM.    Final     Medications:  sodium chloride 0  (12/08/22 1726)   vancomycin 1,500 mg (12/11/22 1728)    atorvastatin  40 mg Oral Daily   Chlorhexidine Gluconate Cloth  6 each Topical Daily   darbepoetin (ARANESP) injection - DIALYSIS  60 mcg Subcutaneous Q Thu-1800   dextrose  1 ampule Intravenous Once   doxercalciferol  6 mcg Intravenous Q T,Th,Sa-HD   ferric citrate  630 mg Oral TID WC   insulin aspart  0-6 Units Subcutaneous Q4H   metroNIDAZOLE  500 mg Oral Q12H   midodrine  15 mg Oral Q8H   oxybutynin  5 mg Oral TID   Assessment/ Plan: Pt is a 47 y.o. yo male with a past medical history significant for ESRD on HD, chronic hypotension, A-fib, CHF admitted with sepsis and a right foot infection.   OP HD: TTS HD  4.5h   500/ 500  140kg   2/2 bath  TDC  Hep 5000+ - hectorol 6 mcg IV three times per week - parsabiv 7.5 mg IV three times per week - no esa   # Sepsis due to right foot infection/possible abscess: Started on Vanc/Cefepime -> now Vanc + metronidazole. S/p R foot I&D 7/1 -> Cx growing MRSA. S/p 1st ray amputation 7/5. Needs 4 weeks of vancomycin per ID.    # ESRD TTS: Next HD 7/6    # Chronic hypotension: SBP runs 70-80 range per patient, asymptomatic. Currently on midodrine 15mg  TID.   # Anemia of CKD: Hgb 9.4 -  Aranesp q Thurs (last 7/4).    # CKD-MBD: Continue Auryxia, Hectorol. Parsabiv not on formulary here.  Monitor labs.   # Mild hyperkalemia: Improved with HD, monitor.   # A-Fib: On Eliquis  Clydia Nieves Susann Givens PA-C [facility] 12/12/2022,12:21 PM

## 2022-12-12 NOTE — Progress Notes (Signed)
PT Cancellation Note  Patient Details Name: [name] MRN: [id number] DOB: Dec 13, 1975   Cancelled Treatment:    Reason Eval/Treat Not Completed: Patient at procedure or test/unavailable   [name] [name] 12/12/2022, 7:04 AM Merryl Hacker, PT Acute Rehabilitation Services Office: [phone]

## 2022-12-12 NOTE — Progress Notes (Signed)
   12/12/22 1030  Assess: MEWS Score  Temp 97.6 F (36.4 C)  BP (!) 76/52  MAP (mmHg) (!) 61  Pulse Rate 72  ECG Heart Rate (!) 58  Resp 17  Level of Consciousness Alert  SpO2 97 %  O2 Device Nasal Cannula  O2 Flow Rate (L/min) 2 L/min  Assess: MEWS Score  MEWS Temp 0  MEWS Systolic 2  MEWS Pulse 0  MEWS RR 0  MEWS LOC 0  MEWS Score 2  MEWS Score Color Yellow  Assess: if the MEWS score is Yellow or Red  Were vital signs taken at a resting state? Yes  Focused Assessment No change from prior assessment  Does the patient meet 2 or more of the SIRS criteria? No  MEWS guidelines implemented  Yes, yellow  Treat  MEWS Interventions Considered administering scheduled or prn medications/treatments as ordered  Take Vital Signs  Increase Vital Sign Frequency  Yellow: Q2hr x1, continue Q4hrs until patient remains green for 12hrs  Escalate  MEWS: Escalate Yellow: Discuss with charge nurse and consider notifying provider and/or RRT  Notify: Charge Nurse/RN  Name of Charge Nurse/RN Notified Bryan  Assess: SIRS CRITERIA  SIRS Temperature  0  SIRS Pulse 0  SIRS Respirations  0  SIRS WBC 0  SIRS Score Sum  0

## 2022-12-12 NOTE — Progress Notes (Signed)
   12/12/22 1100  Assess: MEWS Score  BP (!) 90/52  MAP (mmHg) (!) 64  Pulse Rate (!) 48  ECG Heart Rate (!) 50  Resp 16  Level of Consciousness Alert  SpO2 100 %  O2 Device Nasal Cannula  O2 Flow Rate (L/min) 2 L/min  Assess: MEWS Score  MEWS Temp 0  MEWS Systolic 1  MEWS Pulse 1  MEWS RR 0  MEWS LOC 0  MEWS Score 2  MEWS Score Color Yellow  Assess: if the MEWS score is Yellow or Red  Were vital signs taken at a resting state? Yes  Focused Assessment No change from prior assessment  Does the patient meet 2 or more of the SIRS criteria? No  MEWS guidelines implemented  Yes, yellow  Treat  MEWS Interventions Considered administering scheduled or prn medications/treatments as ordered  Take Vital Signs  Increase Vital Sign Frequency  Yellow: Q2hr x1, continue Q4hrs until patient remains green for 12hrs  Escalate  MEWS: Escalate Yellow: Discuss with charge nurse and consider notifying provider and/or RRT  Notify: Charge Nurse/RN  Name of Charge Nurse/RN Notified Bryan  Assess: SIRS CRITERIA  SIRS Temperature  0  SIRS Pulse 0  SIRS Respirations  0  SIRS WBC 0  SIRS Score Sum  0

## 2022-12-12 NOTE — Op Note (Signed)
OPERATIVE REPORT Patient name: [name] MRN: [id number] DOB: 08-19-1975  DOS:  12/12/22  Preop Dx: Ulcer of right foot, unspecified ulcer stage (HCC)  Sepsis without acute organ dysfunction, due to unspecified organism (HCC) Postop Dx: same  Procedure:  1. Right partial first ray amputation   Surgeon: Louann Sjogren, DPM  Anesthesia: 50-50 mixture of 2% lidocaine plain with 0.5% Marcaine plain totaling 20cc infiltrated in the patient's right lower extremity  Hemostasis: No TQ necessary   EBL: Materials: n/a Injectables: as above Pathology: Right partial first ray for pathology and residual metatarsal for culture   Condition: The patient tolerated the procedure and anesthesia well. No complications noted or reported   Justification for procedure: The patient is a 47 y.o. male who presents today for surgical treatment of right foot osteomyelitis . All conservative modalities of been unsuccessful in providing any sort of satisfactory alleviation of symptoms with the patient. The patient was told benefits as well as possible side effects of the surgery. The patient consented for surgical correction. The patient consent form was reviewed. All patient questions were answered. No guarantees were expressed or implied. The patient and the surgeon both signed the patient consent form with the witness present and placed in the patient's chart.   Procedure in Detail: The patient was brought to the operating room, placed in the operating table in the supine position at which time an aseptic scrub and drape were performed about the patient's respective lower extremity after anesthesia was induced as described above. Attention was then directed to the surgical area where procedure number one commenced.  Procedure #1:   Attention was directed to the right first digit were an incision was preformed encircling the base of the toe and extending to the previous medial dorsal incision  over the first metatarsophalangeal joint. Incision was made down to bone and digit was amputated at the level of the metatarsophalangeal joint. After the toe was disarticulated it was passed off to the back table to be sent to pathology for further evaluation. The first metatarsal head was resected back to healthy bone and resected with sagital saw.  The extensor and flexor tendons were identified and resected as far proximally as possible. Any necrotic tissue was removed to healthy bleeding tissue. About 20 cc of purulence was noted tracking proximally to the midfoot. This was expressed and hemostats used to open up additional areas and exsanguinate residual purulence.  The residual first metatarsal was noted to be healthy and hard. . The area was irrigated copiously sterile saline and pulse lavage and residual bone was sent to microbiology. Any bleeders noted were cauterized as necessary. The skin was re-approximated utilizing 3-0 nylon suture leaving a 1 cm opening proximally that was packed with 1/2 iodoform packing.    Dry sterile compressive dressings were then applied to all previously mentioned incision sites about the patient's lower extremity. The patient was then transferred from the operating room to the recovery room having tolerated the procedure and anesthesia well. All vital signs are stable. After a brief stay in the recovery room the patient was readmitted to the floor.    Disposition:  Podiatry will follow cultures and progress.    Louann Sjogren, DPM [facility]  Dr. Louann Sjogren, DPM    [address]  [place], [place] [address]                Office [phone]  Fax [phone]

## 2022-12-12 NOTE — Interval H&P Note (Signed)
History and Physical Interval Note:  12/12/2022 7:16 AM  [name]  has presented today for surgery, with the diagnosis of Right foot osteomyeltiis.  The various methods of treatment have been discussed with the patient and family. After consideration of risks, benefits and other options for treatment, the patient has consented to  Procedure(s) with comments: AMPUTATION RAY (Right) - surgical team to do local block as a surgical intervention.  The patient's history has been reviewed, patient examined, no change in status, stable for surgery.  I have reviewed the patient's chart and labs.  Questions were answered to the patient's satisfaction.     Louann Sjogren

## 2022-12-12 NOTE — Anesthesia Postprocedure Evaluation (Signed)
Anesthesia Post Note  Patient: [name]  Procedure(s) Performed: AMPUTATION RAY (Right)     Patient location during evaluation: PACU Anesthesia Type: MAC Level of consciousness: awake and alert Pain management: pain level controlled Vital Signs Assessment: post-procedure vital signs reviewed and stable Respiratory status: spontaneous breathing, nonlabored ventilation and respiratory function stable Cardiovascular status: blood pressure returned to baseline Anesthetic complications: no   No notable events documented.  Last Vitals:  Vitals:   12/12/22 1045 12/12/22 1100  BP: (!) 82/43 (!) 90/52  Pulse: (!) 54 (!) 48  Resp: 15 16  Temp:    SpO2: 94% 100%    Last Pain:  Vitals:   12/12/22 1124  TempSrc:   PainSc: Asleep                 Beryle Lathe

## 2022-12-12 NOTE — Progress Notes (Signed)
   12/12/22 1400  Assess: MEWS Score  BP (!) 75/59  MAP (mmHg) 65  Pulse Rate 81  ECG Heart Rate 80  Resp 18  SpO2 99 %  O2 Device Nasal Cannula  O2 Flow Rate (L/min) 2 L/min  Assess: MEWS Score  MEWS Temp 0  MEWS Systolic 2  MEWS Pulse 0  MEWS RR 0  MEWS LOC 0  MEWS Score 2  MEWS Score Color Yellow  Assess: if the MEWS score is Yellow or Red  Were vital signs taken at a resting state? Yes  Focused Assessment No change from prior assessment  Does the patient meet 2 or more of the SIRS criteria? No  MEWS guidelines implemented  Yes, yellow  Treat  MEWS Interventions Considered administering scheduled or prn medications/treatments as ordered  Take Vital Signs  Increase Vital Sign Frequency  Yellow: Q2hr x1, continue Q4hrs until patient remains green for 12hrs  Escalate  MEWS: Escalate Yellow: Discuss with charge nurse and consider notifying provider and/or RRT  Notify: Charge Nurse/RN  Name of Charge Nurse/RN Notified Bryan  Assess: SIRS CRITERIA  SIRS Temperature  0  SIRS Pulse 0  SIRS Respirations  0  SIRS WBC 0  SIRS Score Sum  0

## 2022-12-12 NOTE — Progress Notes (Signed)
   12/12/22 1216  Assess: MEWS Score  BP (!) 77/62  MAP (mmHg) 67  ECG Heart Rate 64  Resp (!) 21  SpO2 100 %  O2 Device Nasal Cannula  O2 Flow Rate (L/min) 2 L/min  Assess: MEWS Score  MEWS Temp 0  MEWS Systolic 2  MEWS Pulse 0  MEWS RR 1  MEWS LOC 0  MEWS Score 3  MEWS Score Color Yellow  Assess: if the MEWS score is Yellow or Red  Were vital signs taken at a resting state? Yes  Focused Assessment No change from prior assessment  Does the patient meet 2 or more of the SIRS criteria? No  MEWS guidelines implemented  Yes, yellow  Treat  MEWS Interventions Considered administering scheduled or prn medications/treatments as ordered  Take Vital Signs  Increase Vital Sign Frequency  Yellow: Q2hr x1, continue Q4hrs until patient remains green for 12hrs  Escalate  MEWS: Escalate Yellow: Discuss with charge nurse and consider notifying provider and/or RRT  Notify: Charge Nurse/RN  Name of Charge Nurse/RN Notified Bryan  Assess: SIRS CRITERIA  SIRS Temperature  0  SIRS Pulse 0  SIRS Respirations  1  SIRS WBC 0  SIRS Score Sum  1

## 2022-12-12 NOTE — Progress Notes (Signed)
   12/12/22 1201  Assess: MEWS Score  BP (!) 70/34  MAP (mmHg) (!) 43  Pulse Rate 68  ECG Heart Rate 66  Resp 16  Level of Consciousness Alert  SpO2 100 %  O2 Device Nasal Cannula  O2 Flow Rate (L/min) 2 L/min  Assess: MEWS Score  MEWS Temp 0  MEWS Systolic 2  MEWS Pulse 0  MEWS RR 0  MEWS LOC 0  MEWS Score 2  MEWS Score Color Yellow  Assess: if the MEWS score is Yellow or Red  Were vital signs taken at a resting state? Yes  Focused Assessment No change from prior assessment  Does the patient meet 2 or more of the SIRS criteria? No  MEWS guidelines implemented  Yes, yellow  Treat  MEWS Interventions Considered administering scheduled or prn medications/treatments as ordered  Take Vital Signs  Increase Vital Sign Frequency  Yellow: Q2hr x1, continue Q4hrs until patient remains green for 12hrs  Escalate  MEWS: Escalate Yellow: Discuss with charge nurse and consider notifying provider and/or RRT  Notify: Charge Nurse/RN  Name of Charge Nurse/RN Notified Bryan  Assess: SIRS CRITERIA  SIRS Temperature  0  SIRS Pulse 0  SIRS Respirations  0  SIRS WBC 0  SIRS Score Sum  0

## 2022-12-12 NOTE — Brief Op Note (Signed)
12/06/2022 - 12/12/2022  7:18 AM  PATIENT:  [name]  47 y.o. male  PRE-OPERATIVE DIAGNOSIS:  Right foot osteomyeltiis  POST-OPERATIVE DIAGNOSIS:  * No post-op diagnosis entered *  PROCEDURE:  Procedure(s) with comments: AMPUTATION RAY (Right) - surgical team to do local block  SURGEON:  Surgeon(s) and Role:    * Louann Sjogren, DPM - Primary  PHYSICIAN ASSISTANT:   ASSISTANTS: none   ANESTHESIA:   local and MAC  EBL:  100 cc    BLOOD ADMINISTERED:none  DRAINS: none   LOCAL MEDICATIONS USED:  MARCAINE   , LIDOCAINE , and Amount: 20 ml  SPECIMEN:  Source of Specimen:  Right partial first ray for pathology and residual first metatarsal for culture.   DISPOSITION OF SPECIMEN:   Pathology and culture   COUNTS:  YES  TOURNIQUET:  * No tourniquets in log *  DICTATION: .Note written in EPIC  PLAN OF CARE: Admit to inpatient   PATIENT DISPOSITION:  PACU - hemodynamically stable.   Delay start of Pharmacological VTE agent (>24hrs) due to surgical blood loss or risk of bleeding: no

## 2022-12-12 NOTE — Plan of Care (Signed)
  Problem: Education: Goal: Knowledge of General Education information will improve Description: Including pain rating scale, medication(s)/side effects and non-pharmacologic comfort measures Outcome: Progressing   Problem: Clinical Measurements: Goal: Respiratory complications will improve Outcome: Progressing   Problem: Activity: Goal: Risk for activity intolerance will decrease Outcome: Progressing   

## 2022-12-12 NOTE — Anesthesia Procedure Notes (Signed)
Procedure Name: MAC Date/Time: 12/12/2022 7:40 AM  Performed by: De Nurse, CRNAPre-anesthesia Checklist: Patient identified, Emergency Drugs available, Suction available, Patient being monitored and Timeout performed Patient Re-evaluated:Patient Re-evaluated prior to induction Oxygen Delivery Method: Simple face mask

## 2022-12-12 NOTE — Progress Notes (Signed)
PROGRESS NOTE    [name]  ZOX:[id number] DOB: 12/11/1975 DOA: 12/06/2022 PCP: [name], MD    Brief Narrative:  47 year old ESRD on hemodialysis TTS with right femoral TDC, chronic Eliquis therapy for paroxysmal A-fib and clot radiographs, sleep apnea not using CPAP presented to the hospital with 2 months of right foot ulcer not healing.  In the emergency room initially with low blood pressures, admitted to ICU, subsequently started on midodrine and transferred out of ICU.  Patient remains in the hospital for surgical intervention and IV antibiotics.   Assessment & Plan:   Severe sepsis from right foot ulcer/abscess and osteomyelitis: Right foot x-ray was negative for osteomyelitis. MRI negative for osteomyelitis but patient has right great toe abscess. Blood cultures on admission Staph epidermidis Incision and drainage of foot abscess on 7/1, surgical cultures with MRSA.  Currently on vancomycin. Persistent purulence in the great toe abscess. Underwent right great toe ray amputation 7/5.  Wound closed. On vancomycin and metronidazole.  Followed by ID.  Chronic hypotension: Asymptomatic.  On midodrine 15 mg 3 times daily.  500 mL bolus postsurgery today.  ESRD on hemodialysis, hyperkalemia: Improved.  Getting dialysis on his schedule.  Chronic diastolic heart failure: Euvolemic.  OSA not on CPAP: Refusing CPAP at night.  PAF on Eliquis: Normal sinus rhythm.  Not on any rate control medications.  On hold for surgery .  Hypoglycemia: Due to prolonged n.p.o. status.  On dextrose infusion, regular diet.   DVT prophylaxis: heparin injection 5,000 Units Start: 12/11/22 0915 SCDs Start: 12/06/22 1433   Code Status: Full code.  Family Communication: None at the bedside Disposition Plan: Status is: Inpatient Remains inpatient appropriate because: Active surgical interventions.     Consultants:  Podiatry Nephrology  Procedures:  I&D of the right  foot  Antimicrobials:  Vancomycin  7/1   Subjective:  Came back from surgery.  Feels bad.  Blood pressures low but denies any dizziness.  Blood sugars are low, now he is able to eat regular diet.  Objective: Vitals:   12/12/22 1015 12/12/22 1030 12/12/22 1045 12/12/22 1100  BP: (!) 88/50 (!) 76/52 (!) 82/43 (!) 90/52  Pulse: 60 72 (!) 54 (!) 48  Resp: 17 17 15 16   Temp:  97.6 F (36.4 C)    TempSrc:  Oral    SpO2: 99% 97% 94% 100%  Weight:      Height:        Intake/Output Summary (Last 24 hours) at 12/12/2022 1129 Last data filed at 12/12/2022 1013 Gross per 24 hour  Intake 340 ml  Output 3400 ml  Net -3060 ml    Filed Weights   12/11/22 0913 12/11/22 1430 12/12/22 0500  Weight: (!) 145.8 kg (!) 141 kg (!) 142.1 kg    Examination:  General: Slightly anxious.  Not in any distress.  On room air. Cardiovascular: S1-S2 normal.  Regular rate rate rhythm. Respiratory: Bilateral clear.  No added sounds. Gastrointestinal: Soft.  Nontender. Ext: Right femoral AV graft, hemodialysis catheter left groin. Right foot with immediate postop surgical dressing. Neuro: Alert awake and oriented. Musculoskeletal: No deformities.      Data Reviewed: I have personally reviewed following labs and imaging studies  CBC: Recent Labs  Lab 12/06/22 1053 12/07/22 0646 12/08/22 1633 12/09/22 0127 12/10/22 0046 12/11/22 0915  WBC 14.4* 16.1*  --  15.0* 13.7* 13.9*  NEUTROABS 11.3*  --   --   --   --   --   HGB 12.6*  10.5* 12.6* 9.6* 9.6* 9.4*  HCT 39.8 32.9* 37.0* 30.0* 30.1* 29.3*  MCV 92.8 94.5  --  91.7 89.9 92.1  PLT 189 182  --  218 231 295    Basic Metabolic Panel: Recent Labs  Lab 12/07/22 0646 12/08/22 0049 12/08/22 1633 12/08/22 1744 12/09/22 0127 12/10/22 0046 12/11/22 0915  NA 132* 130* 127* 130* 130* 129* 131*  K 4.9 5.5* 6.1* 5.4* 5.5* 5.1 5.2*  CL 92* 92* 97* 91* 90* 91* 91*  CO2 23 20*  --  22 20* 22 22  GLUCOSE 125* 99 101* 108* 106* 88 103*  BUN  49* 61* 64* 69* 76* 58* 80*  CREATININE 14.47* 15.72* 16.90* 16.85* 17.52* 13.67* 15.98*  CALCIUM 9.3 9.2  --  9.1 9.5 9.5 9.8  MG 2.2  --   --   --  2.1 2.1  --   PHOS 6.3*  --   --   --   --   --  7.4*    GFR: Estimated Creatinine Clearance: 8.5 mL/min (A) (by C-G formula based on SCr of 15.98 mg/dL (H)). Liver Function Tests: Recent Labs  Lab 12/06/22 1053 12/11/22 0915  AST 26  --   ALT 31  --   ALKPHOS 58  --   BILITOT 1.1  --   PROT 8.5*  --   ALBUMIN 3.8 2.5*    No results for input(s): "LIPASE", "AMYLASE" in the last 168 hours. No results for input(s): "AMMONIA" in the last 168 hours. Coagulation Profile: No results for input(s): "INR", "PROTIME" in the last 168 hours. Cardiac Enzymes: No results for input(s): "CKTOTAL", "CKMB", "CKMBINDEX", "TROPONINI" in the last 168 hours. BNP (last 3 results) No results for input(s): "PROBNP" in the last 8760 hours. HbA1C: No results for input(s): "HGBA1C" in the last 72 hours. CBG: Recent Labs  Lab 12/10/22 2130 12/11/22 0555 12/11/22 1709 12/11/22 2101 12/12/22 0729  GLUCAP 98 76 87 91 74    Lipid Profile: No results for input(s): "CHOL", "HDL", "LDLCALC", "TRIG", "CHOLHDL", "LDLDIRECT" in the last 72 hours. Thyroid Function Tests: No results for input(s): "TSH", "T4TOTAL", "FREET4", "T3FREE", "THYROIDAB" in the last 72 hours. Anemia Panel: No results for input(s): "VITAMINB12", "FOLATE", "FERRITIN", "TIBC", "IRON", "RETICCTPCT" in the last 72 hours. Sepsis Labs: Recent Labs  Lab 12/06/22 1052 12/06/22 1253 12/08/22 0049  LATICACIDVEN 3.2* 2.7* 1.0     Recent Results (from the past 240 hour(s))  Blood culture (routine x 2)     Status: None   Collection Time: 12/06/22 12:20 PM   Specimen: BLOOD LEFT HAND  Result Value Ref Range Status   Specimen Description BLOOD LEFT HAND  Final   Special Requests   Final    BOTTLES DRAWN AEROBIC ONLY Blood Culture results may not be optimal due to an inadequate volume  of blood received in culture bottles   Culture   Final    NO GROWTH 5 DAYS Performed at [facility], [address], [place], [place] [address]    Report Status 12/11/2022 FINAL  Final  Blood culture (routine x 2)     Status: Abnormal   Collection Time: 12/06/22 12:45 PM   Specimen: BLOOD  Result Value Ref Range Status   Specimen Description BLOOD SITE NOT SPECIFIED  Final   Special Requests   Final    BOTTLES DRAWN AEROBIC AND ANAEROBIC Blood Culture results may not be optimal due to an inadequate volume of blood received in culture bottles   Culture  Setup Time   Final    GRAM POSITIVE COCCI AEROBIC BOTTLE ONLY CRITICAL RESULT CALLED TO, READ BACK BY AND VERIFIED WITH: PHARMD LAUREN BELL [id number] AT 1730 BY EC    Culture (A)  Final    STAPHYLOCOCCUS EPIDERMIDIS THE SIGNIFICANCE OF ISOLATING THIS ORGANISM FROM A SINGLE VENIPUNCTURE CANNOT BE PREDICTED WITHOUT FURTHER CLINICAL AND CULTURE CORRELATION. SUSCEPTIBILITIES AVAILABLE ONLY ON REQUEST. Performed at [facility], [address], [place], [place] [address]    Report Status 12/08/2022 FINAL  Final  Blood Culture ID Panel (Reflexed)     Status: Abnormal   Collection Time: 12/06/22 12:45 PM  Result Value Ref Range Status   Enterococcus faecalis NOT DETECTED NOT DETECTED Final   Enterococcus Faecium NOT DETECTED NOT DETECTED Final   Listeria monocytogenes NOT DETECTED NOT DETECTED Final   Staphylococcus species DETECTED (A) NOT DETECTED Final    Comment: CRITICAL RESULT CALLED TO, READ BACK BY AND VERIFIED WITH: PHARMD LAUREN BELL [id number] AT 1730 BY EC    Staphylococcus aureus (BCID) NOT DETECTED NOT DETECTED Final   Staphylococcus epidermidis DETECTED (A) NOT DETECTED Final    Comment: Methicillin (oxacillin) resistant coagulase negative staphylococcus. Possible blood culture contaminant (unless isolated from more than one blood culture draw or clinical case suggests pathogenicity). No antibiotic treatment is  indicated for blood  culture contaminants. REPEAT WITH NEW VIAL OF CTRL PHARMD LAUREN BELL [id number] AT 1730 BY EC    Staphylococcus lugdunensis NOT DETECTED NOT DETECTED Final   Streptococcus species NOT DETECTED NOT DETECTED Final   Streptococcus agalactiae NOT DETECTED NOT DETECTED Final   Streptococcus pneumoniae NOT DETECTED NOT DETECTED Final   Streptococcus pyogenes NOT DETECTED NOT DETECTED Final   A.calcoaceticus-baumannii NOT DETECTED NOT DETECTED Final   Bacteroides fragilis NOT DETECTED NOT DETECTED Final   Enterobacterales NOT DETECTED NOT DETECTED Final   Enterobacter cloacae complex NOT DETECTED NOT DETECTED Final   Escherichia coli NOT DETECTED NOT DETECTED Final   Klebsiella aerogenes NOT DETECTED NOT DETECTED Final   Klebsiella oxytoca NOT DETECTED NOT DETECTED Final   Klebsiella pneumoniae NOT DETECTED NOT DETECTED Final   Proteus species NOT DETECTED NOT DETECTED Final   Salmonella species NOT DETECTED NOT DETECTED Final   Serratia marcescens NOT DETECTED NOT DETECTED Final   Haemophilus influenzae NOT DETECTED NOT DETECTED Final   Neisseria meningitidis NOT DETECTED NOT DETECTED Final   Pseudomonas aeruginosa NOT DETECTED NOT DETECTED Final   Stenotrophomonas maltophilia NOT DETECTED NOT DETECTED Final   Candida albicans NOT DETECTED NOT DETECTED Final   Candida auris NOT DETECTED NOT DETECTED Final   Candida glabrata NOT DETECTED NOT DETECTED Final   Candida krusei NOT DETECTED NOT DETECTED Final   Candida parapsilosis NOT DETECTED NOT DETECTED Final   Candida tropicalis NOT DETECTED NOT DETECTED Final   Cryptococcus neoformans/gattii NOT DETECTED NOT DETECTED Final   Methicillin resistance mecA/C DETECTED (A) NOT DETECTED Final    Comment: CRITICAL RESULT CALLED TO, READ BACK BY AND VERIFIED WITH: Chestine Spore [id number] AT 1730 BY EC Performed at [facility], [address], [place], [place] [address]   MRSA Next Gen by PCR, Nasal     Status:  Abnormal   Collection Time: 12/06/22  4:33 PM   Specimen: Nasal Mucosa; Nasal Swab  Result Value Ref Range Status   MRSA by PCR Next Gen DETECTED (A) NOT DETECTED Final    Comment: RESULT CALLED TO, READ BACK BY AND VERIFIED WITH: 12/06/2022 AT 1836 TO RN  BRIANNA B., ADC (NOTE) The GeneXpert MRSA Assay (FDA approved for NASAL specimens only), is one component of a comprehensive MRSA colonization surveillance program. It is not intended to diagnose MRSA infection nor to guide or monitor treatment for MRSA infections. Test performance is not FDA approved in patients less than 82 years old. Performed at [facility], [address] [place], [place] [address]   Aerobic/Anaerobic Culture w Gram Stain (surgical/deep wound)     Status: None   Collection Time: 12/08/22  5:12 PM   Specimen: Path fluid; Tissue  Result Value Ref Range Status   Specimen Description WOUND  Final   Special Requests RIGHT FOOT  Final   Gram Stain   Final    RARE WBC PRESENT, PREDOMINANTLY PMN NO ORGANISMS SEEN    Culture   Final    MODERATE METHICILLIN RESISTANT STAPHYLOCOCCUS AUREUS MODERATE BACTEROIDES FRAGILIS BETA LACTAMASE POSITIVE Performed at [facility], [address], [place], [place] [address]    Report Status 12/11/2022 FINAL  Final   Organism ID, Bacteria METHICILLIN RESISTANT STAPHYLOCOCCUS AUREUS  Final      Susceptibility   Methicillin resistant staphylococcus aureus - MIC*    CIPROFLOXACIN <=0.5 SENSITIVE Sensitive     ERYTHROMYCIN >=8 RESISTANT Resistant     GENTAMICIN <=0.5 SENSITIVE Sensitive     OXACILLIN >=4 RESISTANT Resistant     TETRACYCLINE <=1 SENSITIVE Sensitive     VANCOMYCIN 1 SENSITIVE Sensitive     TRIMETH/SULFA <=10 SENSITIVE Sensitive     CLINDAMYCIN <=0.25 SENSITIVE Sensitive     RIFAMPIN <=0.5 SENSITIVE Sensitive     Inducible Clindamycin NEGATIVE Sensitive     LINEZOLID 2 SENSITIVE Sensitive     * MODERATE METHICILLIN RESISTANT STAPHYLOCOCCUS AUREUS   Aerobic/Anaerobic Culture w Gram Stain (surgical/deep wound)     Status: None   Collection Time: 12/08/22  5:21 PM   Specimen: Path fluid; Tissue  Result Value Ref Range Status   Specimen Description WOUND  Final   Special Requests FIRST METATARSAL  Final   Gram Stain NO WBC SEEN NO ORGANISMS SEEN   Final   Culture   Final    FEW METHICILLIN RESISTANT STAPHYLOCOCCUS AUREUS FEW BACTEROIDES FRAGILIS BETA LACTAMASE POSITIVE Performed at [facility], [address], [place], [place] [address]    Report Status 12/11/2022 FINAL  Final   Organism ID, Bacteria METHICILLIN RESISTANT STAPHYLOCOCCUS AUREUS  Final      Susceptibility   Methicillin resistant staphylococcus aureus - MIC*    CIPROFLOXACIN <=0.5 SENSITIVE Sensitive     ERYTHROMYCIN >=8 RESISTANT Resistant     GENTAMICIN <=0.5 SENSITIVE Sensitive     OXACILLIN >=4 RESISTANT Resistant     TETRACYCLINE <=1 SENSITIVE Sensitive     VANCOMYCIN 1 SENSITIVE Sensitive     TRIMETH/SULFA <=10 SENSITIVE Sensitive     CLINDAMYCIN <=0.25 SENSITIVE Sensitive     RIFAMPIN <=0.5 SENSITIVE Sensitive     Inducible Clindamycin NEGATIVE Sensitive     LINEZOLID 2 SENSITIVE Sensitive     * FEW METHICILLIN RESISTANT STAPHYLOCOCCUS AUREUS         Radiology Studies: VAS [place] ABI WITH/WO TBI  Result Date: 12/10/2022  LOWER EXTREMITY DOPPLER STUDY Patient Name:  [name]  Date of Exam:   12/10/2022 Medical Rec #: [id number]          Accession #:    [id number] Date of Birth: July 12, 1975          Patient Gender: M Patient  Age:   16 years Exam Location:  [facility] Procedure:      VAS [place] ABI WITH/WO TBI Referring Phys: REBECCA SIKORA --------------------------------------------------------------------------------  Indications: Cellultis/RT foot infection, LT great toe ulceration High Risk Factors: Hypertension, hyperlipidemia, past history of smoking. Other Factors: ESRD (HD- LLE), CHF, Afib.  Limitations: Today's exam was limited due to  bandages and skin texture              (thickened, hard), venous interference. Comparison Study: Previous exam on 07/15/2022 was WNL Performing Technologist: Ernestene Mention RVT, RDMS  Examination Guidelines: A complete evaluation includes at minimum, Doppler waveform signals and systolic blood pressure reading at the level of bilateral brachial, anterior tibial, and posterior tibial arteries, when vessel segments are accessible. Bilateral testing is considered an integral part of a complete examination. Photoelectric Plethysmograph (PPG) waveforms and toe systolic pressure readings are included as required and additional duplex testing as needed. Limited examinations for reoccurring indications may be performed as noted.  ABI Findings: +--------+------------------+-----+-----------+-----------------+ Right   Rt Pressure (mmHg)IndexWaveform   Comment           +--------+------------------+-----+-----------+-----------------+ Brachial105                    triphasic                    +--------+------------------+-----+-----------+-----------------+ PTA     111               1.06 multiphasic                  +--------+------------------+-----+-----------+-----------------+ DP      104               0.99 triphasic  audibly triphasic +--------+------------------+-----+-----------+-----------------+ +---------+------------------+-----+---------+-----------------+ Left     Lt Pressure (mmHg)IndexWaveform Comment           +---------+------------------+-----+---------+-----------------+ Brachial 84                     triphasic                  +---------+------------------+-----+---------+-----------------+ PTA      101               0.96 triphasic                  +---------+------------------+-----+---------+-----------------+ DP       97                0.92 triphasicaudibly triphasic +---------+------------------+-----+---------+-----------------+ Great Toe120                1.14 Normal                     +---------+------------------+-----+---------+-----------------+ +-------+-----------+-----------+------------+------------+ ABI/TBIToday's ABIToday's TBIPrevious ABIPrevious TBI +-------+-----------+-----------+------------+------------+ Right  1.06       bandaged   1.18                     +-------+-----------+-----------+------------+------------+ Left   0.96       1.14       0.99                     +-------+-----------+-----------+------------+------------+  Very difficult obtaining doppler signals due to venous interference.  Summary: Right: Resting right ankle-brachial index is within normal range. Unable to obtain TBI due to wound/bandage. Left: Resting left ankle-brachial index is within normal range. The left toe-brachial index is normal. Great toe too large for  cuff, second toe used. *See table(s) above for measurements and observations.  Electronically signed by Coral Else MD on 12/10/2022 at 9:19:21 PM.    Final         Scheduled Meds:  atorvastatin  40 mg Oral Daily   Chlorhexidine Gluconate Cloth  6 each Topical Daily   darbepoetin (ARANESP) injection - DIALYSIS  60 mcg Subcutaneous Q Thu-1800   doxercalciferol  6 mcg Intravenous Q T,Th,Sa-HD   ferric citrate  630 mg Oral TID WC   insulin aspart  0-6 Units Subcutaneous Q4H   metroNIDAZOLE  500 mg Oral Q12H   midodrine  15 mg Oral Q8H   oxybutynin  5 mg Oral TID   Continuous Infusions:  sodium chloride 0  (12/08/22 1726)   vancomycin 1,500 mg (12/11/22 1728)     LOS: 6 days    Time spent: 35 minutes    [name], MD [facility]

## 2022-12-13 ENCOUNTER — Encounter (HOSPITAL_COMMUNITY): Payer: Self-pay | Admitting: Podiatry

## 2022-12-13 DIAGNOSIS — M86171 Other acute osteomyelitis, right ankle and foot: Secondary | ICD-10-CM | POA: Diagnosis not present

## 2022-12-13 DIAGNOSIS — Z992 Dependence on renal dialysis: Secondary | ICD-10-CM | POA: Diagnosis not present

## 2022-12-13 DIAGNOSIS — N186 End stage renal disease: Secondary | ICD-10-CM | POA: Diagnosis not present

## 2022-12-13 LAB — GLUCOSE, CAPILLARY
Glucose-Capillary: 104 mg/dL — ABNORMAL HIGH (ref 70–99)
Glucose-Capillary: 118 mg/dL — ABNORMAL HIGH (ref 70–99)
Glucose-Capillary: 80 mg/dL (ref 70–99)
Glucose-Capillary: 88 mg/dL (ref 70–99)
Glucose-Capillary: 99 mg/dL (ref 70–99)

## 2022-12-13 LAB — VANCOMYCIN, RANDOM: Vancomycin Rm: 38 ug/mL

## 2022-12-13 MED ORDER — ANTICOAGULANT SODIUM CITRATE 4% (200MG/5ML) IV SOLN: 5.0000 mL | Status: DC | PRN

## 2022-12-13 MED ORDER — ALTEPLASE 2 MG IJ SOLR
2.0000 mg | Freq: Once | INTRAMUSCULAR | Status: AC | PRN
  Administered 2022-12-13: 2 mg
  Filled 2022-12-13 (×2): qty 2

## 2022-12-13 MED ORDER — HEPARIN SODIUM (PORCINE) 1000 UNIT/ML DIALYSIS
1000.0000 [IU] | INTRAMUSCULAR | Status: DC | PRN
  Administered 2022-12-13: 2500 [IU]
  Filled 2022-12-13: qty 1

## 2022-12-13 MED ORDER — LIDOCAINE HCL (PF) 1 % IJ SOLN: 5.0000 mL | INTRAMUSCULAR | Status: DC | PRN

## 2022-12-13 MED ORDER — APIXABAN 5 MG PO TABS
5.0000 mg | ORAL_TABLET | Freq: Two times a day (BID) | ORAL | Status: DC
  Administered 2022-12-13 – 2022-12-16 (×7): 5 mg via ORAL
  Filled 2022-12-13 (×7): qty 1

## 2022-12-13 MED ORDER — HEPARIN SODIUM (PORCINE) 1000 UNIT/ML DIALYSIS
5000.0000 [IU] | INTRAMUSCULAR | Status: DC | PRN
  Administered 2022-12-13: 5000 [IU] via INTRAVENOUS_CENTRAL
  Filled 2022-12-13: qty 5

## 2022-12-13 MED ORDER — VANCOMYCIN VARIABLE DOSE PER UNSTABLE RENAL FUNCTION (PHARMACIST DOSING): Status: DC

## 2022-12-13 MED ORDER — ALTEPLASE 2 MG IJ SOLR
4.0000 mg | Freq: Once | INTRAMUSCULAR | Status: AC
  Administered 2022-12-13: 4 mg
  Filled 2022-12-13: qty 4

## 2022-12-13 MED ORDER — LIDOCAINE-PRILOCAINE 2.5-2.5 % EX CREA: 1.0000 | TOPICAL_CREAM | CUTANEOUS | Status: DC | PRN

## 2022-12-13 MED ORDER — VANCOMYCIN HCL 1500 MG/300ML IV SOLN
1500.0000 mg | INTRAVENOUS | Status: DC
  Filled 2022-12-13: qty 300

## 2022-12-13 MED ORDER — PENTAFLUOROPROP-TETRAFLUOROETH EX AERO: 1.0000 | INHALATION_SPRAY | CUTANEOUS | Status: DC | PRN

## 2022-12-13 NOTE — Progress Notes (Signed)
Received patient in bed to unit.  Alert and oriented.  Informed consent signed and in chart.   TX duration:4  Patient tolerated well.  Transported back to the room  Alert, without acute distress.  Hand-off given to patient's nurse.   Access used: left femoral HD cath Access issues: didn't run well  Total UF removed: 4L Medication(s) given: hectorol, cathflow dwell in catheter   12/13/22 1222  Vitals  Temp 98.2 F (36.8 C)  Temp Source Oral  BP (!) 76/43  MAP (mmHg) (!) 53  BP Location Left Wrist  BP Method Automatic  Patient Position (if appropriate) Lying  Pulse Rate (!) 55  Pulse Rate Source Monitor  ECG Heart Rate 89  Resp 15  Oxygen Therapy  SpO2 99 %  O2 Device Room Air  During Treatment Monitoring  HD Safety Checks Performed Yes  Intra-Hemodialysis Comments Tx completed;Tolerated well (catheter didnt run to well)  Dialysis Fluid Bolus Normal Saline  Bolus Amount (mL) 300 mL      Tyshay Adee [facility]

## 2022-12-13 NOTE — Progress Notes (Signed)
   12/13/22 2055  BiPAP/CPAP/SIPAP  $ Non-Invasive Home Ventilator  Subsequent  BiPAP/CPAP/SIPAP Pt Type Adult  BiPAP/CPAP/SIPAP Resmed  Reason BIPAP/CPAP not in use Non-compliant  Mask Type Full face mask  Mask Size Large  EPAP 7 cmH2O  FiO2 (%) 21 %  Patient Home Equipment No  CPAP/SIPAP surface wiped down Yes  BiPAP/CPAP /SiPAP Vitals  SpO2 97 %

## 2022-12-13 NOTE — Progress Notes (Addendum)
Pharmacy Antibiotic Note  [name] is a 47 y.o. male admitted on 12/06/2022 with RLE abscess and DFI s/p I&D and first ray amputation. Pt has ESRD and is on HD TTS.  Pharmacy has been consulted for Vancomycin dosing.   Pre-HD vancomycin level resulted at 38. Scheduled for 4h dialysis session @ BFR 324mL/min. Estimated postHD level is ~22 which is therapeutic (target level 15- 25 mcg/mL).  Plan: Hold dose of vancomycin today to prevent supratherapeutic postHD levels Continue metronidazole 500mg  PO q12 hours  Would recheck vancomycin level prior to HD on 7/9 ID following - planning 4 weeks of antibiotics   Height: 6\' 2"  (188 cm) Weight: (!) 142.6 kg (314 lb 6 oz) IBW/kg (Calculated) : 82.2  Temp (24hrs), Avg:98.1 F (36.7 C), Min:97.6 F (36.4 C), Max:98.7 F (37.1 C)  Recent Labs  Lab 12/06/22 1052 12/06/22 1053 12/06/22 1053 12/06/22 1253 12/07/22 0646 12/08/22 0049 12/08/22 1633 12/08/22 1744 12/09/22 0127 12/10/22 0046 12/11/22 0915 12/13/22 0806  WBC  --  14.4*  --   --  16.1*  --   --   --  15.0* 13.7* 13.9*  --   CREATININE  --  12.51*   < >  --  14.47* 15.72* 16.90* 16.85* 17.52* 13.67* 15.98*  --   LATICACIDVEN 3.2*  --   --  2.7*  --  1.0  --   --   --   --   --   --   VANCORANDOM  --   --   --   --   --   --   --   --   --   --   --  38   < > = values in this interval not displayed.     Estimated Creatinine Clearance: 8.5 mL/min (A) (by C-G formula based on SCr of 15.98 mg/dL (H)).    No Known Allergies  Antimicrobials this admission: Cefepime 6/29 >> 7/2 Vancomycin 6/29 >>   Microbiology results: 6/29 BCx: 1/2 staph epi- likely contaminant  7/1 OR wound cx + MRSA, B.fragilis (beta lactamase +)  Rexford Maus, PharmD, BCPS 12/13/2022 9:46 AM

## 2022-12-13 NOTE — Progress Notes (Signed)
[facility] Progress Note   Subjective:  Seen in dialysis unit-trouble with cathter this morning. Sluggish flow, BFR 200. May need cathflo dwell, discussed with RN.    Objective Vitals:   12/13/22 0756 12/13/22 0830 12/13/22 0900 12/13/22 0930  BP: (!) 83/40 (!) 72/43 (!) 88/54 (!) 79/45  Pulse: (!) 54 94 72 78  Resp: (!) 24 18 (!) 21 14  Temp:      TempSrc:      SpO2: 94% 95% 100% 100%  Weight:      Height:       Physical Exam General: Well appearing man, NAD. Room air Heart: RRR; no murmur Lungs: CTA anteriorly Abdomen: soft Extremities: Trace LE edema, R foot bandaged Dialysis Access: L femoral [facility]  Additional Objective Labs: Basic Metabolic Panel: Recent Labs  Lab 12/07/22 0646 12/08/22 0049 12/09/22 0127 12/10/22 0046 12/11/22 0915  NA 132*   < > 130* 129* 131*  K 4.9   < > 5.5* 5.1 5.2*  CL 92*   < > 90* 91* 91*  CO2 23   < > 20* 22 22  GLUCOSE 125*   < > 106* 88 103*  BUN 49*   < > 76* 58* 80*  CREATININE 14.47*   < > 17.52* 13.67* 15.98*  CALCIUM 9.3   < > 9.5 9.5 9.8  PHOS 6.3*  --   --   --  7.4*   < > = values in this interval not displayed.    Liver Function Tests: Recent Labs  Lab 12/06/22 1053 12/11/22 0915  AST 26  --   ALT 31  --   ALKPHOS 58  --   BILITOT 1.1  --   PROT 8.5*  --   ALBUMIN 3.8 2.5*    CBC: Recent Labs  Lab 12/06/22 1053 12/07/22 0646 12/08/22 1633 12/09/22 0127 12/10/22 0046 12/11/22 0915  WBC 14.4* 16.1*  --  15.0* 13.7* 13.9*  NEUTROABS 11.3*  --   --   --   --   --   HGB 12.6* 10.5*   < > 9.6* 9.6* 9.4*  HCT 39.8 32.9*   < > 30.0* 30.1* 29.3*  MCV 92.8 94.5  --  91.7 89.9 92.1  PLT 189 182  --  218 231 295   < > = values in this interval not displayed.    Blood Culture    Component Value Date/Time   SDES TOE 12/12/2022 0928   SPECREQUEST RT FOOT 12/12/2022 0928   CULT PENDING 12/12/2022 0928   REPTSTATUS PENDING 12/12/2022 1610   Studies/Results: DG Foot Complete  Right  Result Date: 12/12/2022 CLINICAL DATA:  Osteomyelitis of right foot.  PACU wound check. EXAM: RIGHT FOOT COMPLETE - 3+ VIEW COMPARISON:  Right foot radiographs 12/08/2022; MRI right forefoot 11/26/2022 FINDINGS: Interval amputation of the first ray to the level of the mid shaft. There is subcutaneous air within the postsurgical distal surgical site, likely within normal limits recent surgery. The first metatarsal amputation margin is sharp. Mild plantar calcaneal heel spur. IMPRESSION: Interval amputation of the first ray to the level of the mid shaft. No complication is seen. Electronically Signed   By: Neita Garnet M.D.   On: 12/12/2022 11:29    Medications:  sodium chloride 0  (12/08/22 1726)   anticoagulant sodium citrate     vancomycin      atorvastatin  40 mg Oral Daily   Chlorhexidine Gluconate Cloth  6 each Topical Daily   darbepoetin (ARANESP)  injection - DIALYSIS  60 mcg Subcutaneous Q Thu-1800   dextrose  1 ampule Intravenous Once   doxercalciferol  6 mcg Intravenous Q T,Th,Sa-HD   ferric citrate  630 mg Oral TID WC   insulin aspart  0-6 Units Subcutaneous Q4H   metroNIDAZOLE  500 mg Oral Q12H   midodrine  15 mg Oral Q8H   oxybutynin  5 mg Oral TID   Assessment/ Plan: Pt is a 47 y.o. yo male with a past medical history significant for ESRD on HD, chronic hypotension, A-fib, CHF admitted with sepsis and a right foot infection.   OP HD: TTS HD  4.5h   500/ 500  140kg   2/2 bath  TDC  Hep 5000+ - hectorol 6 mcg IV three times per week - parsabiv 7.5 mg IV three times per week - no esa   # Sepsis due to right foot infection/possible abscess: Started on Vanc/Cefepime -> now Vanc + metronidazole. S/p R foot I&D 7/1 -> Cx growing MRSA. S/p 1st ray amputation 7/5. Needs 4 weeks of vancomycin per ID.    # ESRD TTS: Next HD 7/6    # Chronic hypotension: SBP runs 70-80 range per patient, asymptomatic. Currently on midodrine 15mg  TID.   # Anemia of CKD: Hgb 9.4 -  Aranesp q Thurs (last 7/4).    # CKD-MBD: Continue Auryxia, Hectorol. Parsabiv not on formulary here.  Monitor labs.   # Mild hyperkalemia: Improved with HD, monitor.   # A-Fib: On Eliquis  Purcell Jungbluth Susann Givens PA-C [facility] 12/13/2022,9:46 AM

## 2022-12-13 NOTE — Progress Notes (Signed)
Subjective:  Patient ID: [name], male    DOB: 1976-01-20,  MRN: [id number]  Patient POD#1  s/p right foot partial ray amputation. Patient relates doing ok and pain minimal but nervous to see his foot.  Denies n/v/f/c.   Past Medical History:  Diagnosis Date   Acute respiratory failure with hypoxia (HCC) 11/30/2018   Anemia    ESRD   ESRD on hemodialysis (HCC) 06/07/2011   TTS [place]. Started HD in 2006, got transplant in 2014 lasted until Dec 2019 then went back on HD.  Has L thigh AVG as of Jun 2020.  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    History of blood transfusion    Hypertension    03/19/22- has not had high blood pressure in 5 years.   Morbid obesity (HCC)    Paroxysmal atrial fibrillation (HCC)    Pre-diabetes    no meds   Sepsis (HCC) 11/30/2018   Sleep apnea    lost weight, does not use CPAP     Past Surgical History:  Procedure Laterality Date   AMPUTATION Right 12/12/2022   Procedure: AMPUTATION RAY;  Surgeon: Louann Sjogren, DPM;  Location: [facility] OR;  Service: Orthopedics/Podiatry;  Laterality: Right;  surgical team to do local block   ARTERIOVENOUS GRAFT PLACEMENT  08/09/2010   Left Thigh Graft by Dr. Cari Caraway   AV FISTULA PLACEMENT Right 05/18/2018   Procedure: INSERTION OF ARTERIOVENOUS (AV) GORE-TEX GRAFT Left THIGH;  Surgeon: Maeola Harman, MD;  Location: [facility] OR;  Service: Vascular;  Laterality: Right;   AV FISTULA PLACEMENT Right 02/15/2021   Procedure: INSERTION OF ARTERIOVENOUS (AV) GORE-TEX LOOP GRAFT RIGHT THIGH;  Surgeon: Maeola Harman, MD;  Location: [facility] OR;  Service: Vascular;  Laterality: Right;   BIOPSY  05/20/2022   Procedure: BIOPSY;  Surgeon: Shellia Cleverly, DO;  Location: [facility];  Service: Gastroenterology;;   CAPD INSERTION N/A 06/13/2022   Procedure: LAPAROSCOPIC INSERTION CONTINUOUS AMBULATORY PERITONEAL DIALYSIS  (CAPD) CATHETER;  Surgeon: Maeola Harman, MD;  Location: [facility]  OR;  Service: Vascular;  Laterality: N/A;   CAPD REMOVAL  05/08/2011   Procedure: CONTINUOUS AMBULATORY PERITONEAL DIALYSIS  (CAPD) CATHETER REMOVAL;  Surgeon: Iona Coach, MD;  Location: [facility] OR;  Service: General;  Laterality: N/A;  Removal of CAPD catheter, Dr. requests to go after 100   CAPD REMOVAL N/A 08/08/2022   Procedure: REMOVAL CONTINUOUS AMBULATORY PERITONEAL DIALYSIS  (CAPD) CATHETER;  Surgeon: Maeola Harman, MD;  Location: [facility] OR;  Service: Vascular;  Laterality: N/A;   COLONOSCOPY WITH PROPOFOL N/A 05/20/2022   Procedure: COLONOSCOPY WITH PROPOFOL;  Surgeon: Shellia Cleverly, DO;  Location: [facility];  Service: Gastroenterology;  Laterality: N/A;   I & D EXTREMITY Right 12/08/2022   Procedure: IRRIGATION AND DEBRIDEMENT RIGHT FOOT;  Surgeon: Louann Sjogren, DPM;  Location: [facility] OR;  Service: Orthopedics/Podiatry;  Laterality: Right;   INSERTION OF DIALYSIS CATHETER  10/05/2010   Right Femoral Cath insertion by Dr. Leonides Sake.  Pt ahas had several caths inserted.   INSERTION OF DIALYSIS CATHETER Right 02/15/2021   Procedure: Attempted INSERTION OF RIGHT and Left Internal Jugular DIALYSIS CATHETER, Insertion of Left Femoral Vein Dialysis Catheter;  Surgeon: Maeola Harman, MD;  Location: [facility] OR;  Service: Vascular;  Laterality: Right;   INSERTION OF DIALYSIS CATHETER Right 04/26/2021   Procedure: INSERTION OF TUNNELED 55cm PALIDROME PRECISION CHRONIC DIALYSIS CATHETER;  Surgeon: Leonie Douglas, MD;  Location: [facility] OR;  Service:  Vascular;  Laterality: Right;   INSERTION OF DIALYSIS CATHETER Left 12/26/2021   Procedure: INSERTION OF TUNNELED  DIALYSIS CATHETER LEFT FEMORAL ARTERY;  Surgeon: Maeola Harman, MD;  Location: [facility] OR;  Service: Vascular;  Laterality: Left;   INSERTION OF DIALYSIS CATHETER Left 03/06/2022   Procedure: INSERTION OF DIALYSIS CATHETER;  Surgeon: Maeola Harman, MD;  Location: [facility] OR;  Service: Vascular;  Laterality: Left;    IR FLUORO GUIDE CV LINE LEFT  04/23/2022   IR FLUORO GUIDE CV LINE RIGHT  05/11/2018   IR FLUORO GUIDE CV LINE RIGHT  07/03/2021   IR FLUORO GUIDE CV LINE RIGHT  07/16/2021   IR PTA ADDL CENTRAL DIALYSIS SEG THRU DIALY CIRCUIT RIGHT Right 07/03/2021   IR US GUIDE VASC ACCESS LEFT  04/23/2022   IR US GUIDE VASC ACCESS RIGHT  05/11/2018   IR VENO/EXT/UNI LEFT  04/23/2022   IR VENOCAVAGRAM IVC  07/16/2021   KIDNEY TRANSPLANT  2014   KNEE ARTHROSCOPY Left    LAPAROSCOPIC LYSIS OF ADHESIONS N/A 06/13/2022   Procedure: LAPAROSCOPIC LYSIS OF ADHESIONS;  Surgeon: Maeola Harman, MD;  Location: [facility] OR;  Service: Vascular;  Laterality: N/A;   POLYPECTOMY  05/20/2022   Procedure: POLYPECTOMY;  Surgeon: Shellia Cleverly, DO;  Location: [facility];  Service: Gastroenterology;;   THROMBECTOMY W/ EMBOLECTOMY Right 04/26/2021   Procedure: REMOVAL OF RIGHT THIGH ARTERIOVENOUS GORE-TEX GRAFT AND REPAIR OF RIGHT COMMON FEMORAL ARTERY;  Surgeon: Leonie Douglas, MD;  Location: [facility] OR;  Service: Vascular;  Laterality: Right;   UPPER EXTREMITY ANGIOGRAPHY Bilateral 05/13/2018   Procedure: UPPER EXTREMITY ANGIOGRAPHY - bilarteral;  Surgeon: Cephus Shelling, MD;  Location: [facility];  Service: Cardiovascular;  Laterality: Bilateral;       Latest Ref Rng & Units 12/11/2022    9:15 AM 12/10/2022   12:46 AM 12/09/2022    1:27 AM  CBC  WBC 4.0 - 10.5 K/uL 13.9  13.7  15.0   Hemoglobin 13.0 - 17.0 g/dL 9.4  9.6  9.6   Hematocrit 39.0 - 52.0 % 29.3  30.1  30.0   Platelets 150 - 400 K/uL 295  231  218        Latest Ref Rng & Units 12/11/2022    9:15 AM 12/10/2022   12:46 AM 12/09/2022    1:27 AM  BMP  Glucose 70 - 99 mg/dL 161  88  096   BUN 6 - 20 mg/dL 80  58  76   Creatinine 0.61 - 1.24 mg/dL 04.54  09.81  19.14   Sodium 135 - 145 mmol/L 131  129  130   Potassium 3.5 - 5.1 mmol/L 5.2  5.1  5.5   Chloride 98 - 111 mmol/L 91  91  90   CO2 22 - 32 mmol/L 22  22  20    Calcium 8.9 - 10.3  mg/dL 9.8  9.5  9.5      Objective:   Vitals:   12/13/22 0752 12/13/22 0756  BP: (!) 96/59 (!) 83/40  Pulse: (!) 54 (!) 54  Resp: 11 (!) 24  Temp: 97.7 F (36.5 C)   SpO2: 100% 94%    General:AA&O x 3. Normal mood and affect   Vascular: DP and PT pulses 2/4 bilateral. Brisk capillary refill to all digits. Pedal hair present   Neruological. Epicritic sensation grossly intact.   Derm: Surgical incision well coapted with distal and proximal inciision with small opening for drainage. No purulence noted  Edema and erythema surrounding plantar wound and dorsal foot improved.    MSK: MMT 5/5 in dorsiflexion, plantar flexion, inversion and eversion. Normal joint ROM without pain or crepitus.    Vascular:  VAS [place] ABI W/WO TBI 07/25/2022  ABI Findings:  +---------+------------------+-----+---------+--------+  Right   Rt Pressure (mmHg)IndexWaveform Comment   +---------+------------------+-----+---------+--------+  Brachial 94                                        +---------+------------------+-----+---------+--------+  PTA     111               1.13 triphasic          +---------+------------------+-----+---------+--------+  DP      103               1.05 triphasic          +---------+------------------+-----+---------+--------+  Great Toe103               1.05                    +---------+------------------+-----+---------+--------+   +---------+------------------+-----+---------------------------+-------+  Left    Lt Pressure (mmHg)IndexWaveform                   Comment  +---------+------------------+-----+---------------------------+-------+  Brachial 98                                                         +---------+------------------+-----+---------------------------+-------+  PTA     112               1.14 triphasic                           +---------+------------------+-----+---------------------------+-------+  DP       99                1.01 triphasic                           +---------+------------------+-----+---------------------------+-------+  Great Toe                       2nd toe waveform is present         +---------+------------------+-----+---------------------------+-------+   +-------+-----------+--------------------------------+------------+--------  ----+  ABI/TBIToday's ABIToday's TBI                     Previous  ABIPrevious TBI  +-------+-----------+--------------------------------+------------+--------  ----+  Right 1.13       1.05                            1.24        0.66           +-------+-----------+--------------------------------+------------+--------  ----+  Left  1.14       great toe ulcer/2nd toe         1.16        1.16                             waveforms present.                                         +-------+-----------+--------------------------------+------------+--------  ----+  Previous ABI at [facility] on 12/28/20.  Summary:  Right: Resting right ankle-brachial index is within normal range. The right toe-brachial index is normal.  Left: Resting left ankle-brachial index is within normal range. Great toe ulcer. Second toe waveform is present   DG FOOT COMPLETE RIGHT 12/06/2022 FINDINGS: No fracture or dislocation. Normal mineralization. Mild hallux valgus deformity. Small calcaneal spurs. No subcutaneous gas or radiodense foreign body.   IMPRESSION: 1. No acute findings. 2. Mild hallux valgus.   MR FOOT RIGHT WO CONTRAST 12/06/2022 IMPRESSION: 1. Small plantar skin ulcer at the level of the great toe metatarsophalangeal joint with associated soft tissue edema and swelling about the medial greater than lateral aspects of the great toe and medial greater than lateral aspects of the midfoot consistent with cellulitis. There are regions of coalescing fluid medial to the great toe metatarsophalangeal joints and dorsal  medial to the length of the proximal phalanx of the great toe suspicious for abscesses. 2. No cortical erosion or marrow edema is seen to indicate MRI evidence of acute osteomyelitis.  Assessment & Plan:  Patient was evaluated and treated and all questions answered.  DX: Right foot abscess and diabetic foot infection Dressing changed and packing pulled today at bedside. No purulence or signs of infection noted.  Antibiotics: Continue IV cefepime. Will follow cultures. Appreciate ID reccs.  Patient may be heel weight bearing to operative extremity in surgical shoe.  Will observe for next 24 hours and if remains stable will be ok for discharge from podiatry standpoint.   Louann Sjogren, DPM  Accessible via secure chat for questions or concerns.

## 2022-12-13 NOTE — Progress Notes (Signed)
PROGRESS NOTE    [name]  ZOX:[id number] DOB: 04/17/1976 DOA: 12/06/2022 PCP: [name], MD    Brief Narrative:  47 year old ESRD on hemodialysis TTS with right femoral TDC, chronic Eliquis therapy for paroxysmal A-fib and clotted fistulas, sleep apnea not using CPAP presented to the hospital with 2 months of right foot ulcer not healing.  In the emergency room initially with low blood pressures, admitted to ICU, subsequently started on midodrine and transferred out of ICU.  Patient remains in the hospital for surgical intervention and IV antibiotics. He does have chronically low blood pressures and mostly asymptomatic.    Assessment & Plan:   Severe sepsis from right foot ulcer/abscess and osteomyelitis: Incision and drainage 7/1, surgical cultures with MRSA Persistent infection, status post right great toe ray amputation 7/5.  Surgical cultures pending. Blood cultures with Staph epidermidis. Surgical cultures with MRSA. Seen by ID.  Recommended vancomycin for 4 weeks with hemodialysis, metronidazole 500 mg twice daily for 4 weeks. Surgical shoe.  Weightbearing with heel.  Chronic hypotension: Asymptomatic.  On midodrine 15 mg 3 times daily.  500 mL bolus postsurgery given.  ESRD on hemodialysis, hyperkalemia: Improved.  Getting dialysis on his schedule.  Chronic diastolic heart failure: Euvolemic.  OSA not on CPAP: Refusing CPAP at night.  PAF on Eliquis: Normal sinus rhythm.  Not on any rate control medications.  Resume Eliquis today.  Hypoglycemia: Due to prolonged n.p.o. status.  On dextrose infusion, regular diet.  Stable.  Improved.   DVT prophylaxis: SCDs Start: 12/06/22 1433 apixaban (ELIQUIS) tablet 5 mg   Code Status: Full code.  Family Communication: None at the bedside Disposition Plan: Status is: Inpatient Remains inpatient appropriate because: Active surgical interventions.  Admitted postop.     Consultants:   Podiatry Nephrology Infectious disease.  Procedures:  I&D of the right foot  Antimicrobials:  Vancomycin  7/1--- Flagyl 7/4---   Subjective:  Patient seen in the dialysis unit.  Pain is controlled.  He still has some suprapubic discomfort and cramping and had some relief with oxybutynin.  No urine output.  No urine on bladder scan. Receiving hemodialysis.  Blood pressure is low but denies any nausea vomiting or dizziness or lightheadedness.   Objective: Vitals:   12/13/22 1007 12/13/22 1030 12/13/22 1105 12/13/22 1132  BP: (!) 81/45 (!) 72/51 (!) 84/55 (!) 77/50  Pulse: 80 82 81 84  Resp: 14 13 13 13   Temp:      TempSrc:      SpO2: 98% 91% 100% 100%  Weight:      Height:        Intake/Output Summary (Last 24 hours) at 12/13/2022 1138 Last data filed at 12/12/2022 1800 Gross per 24 hour  Intake 660 ml  Output --  Net 660 ml   Filed Weights   12/11/22 1430 12/12/22 0500 12/13/22 0658  Weight: (!) 141 kg (!) 142.1 kg (!) 142.6 kg    Examination:  General: Slightly anxious.  Not in any distress.  On room air.  Getting hemodialysis. Cardiovascular: S1-S2 normal.  Regular rate rate rhythm. Respiratory: Bilateral clear.  No added sounds. Gastrointestinal: Soft.  Nontender. Ext: Right femoral AV graft, hemodialysis catheter left groin. Right foot with postop dressing, recently changed and not removed by me. Neuro: Alert awake and oriented. Musculoskeletal: No deformities.      Data Reviewed: I have personally reviewed following labs and imaging studies  CBC: Recent Labs  Lab 12/07/22 0646 12/08/22 1633 12/09/22 0127 12/10/22 0046 12/11/22  0915  WBC 16.1*  --  15.0* 13.7* 13.9*  HGB 10.5* 12.6* 9.6* 9.6* 9.4*  HCT 32.9* 37.0* 30.0* 30.1* 29.3*  MCV 94.5  --  91.7 89.9 92.1  PLT 182  --  218 231 295   Basic Metabolic Panel: Recent Labs  Lab 12/07/22 0646 12/08/22 0049 12/08/22 1633 12/08/22 1744 12/09/22 0127 12/10/22 0046 12/11/22 0915  NA 132*  130* 127* 130* 130* 129* 131*  K 4.9 5.5* 6.1* 5.4* 5.5* 5.1 5.2*  CL 92* 92* 97* 91* 90* 91* 91*  CO2 23 20*  --  22 20* 22 22  GLUCOSE 125* 99 101* 108* 106* 88 103*  BUN 49* 61* 64* 69* 76* 58* 80*  CREATININE 14.47* 15.72* 16.90* 16.85* 17.52* 13.67* 15.98*  CALCIUM 9.3 9.2  --  9.1 9.5 9.5 9.8  MG 2.2  --   --   --  2.1 2.1  --   PHOS 6.3*  --   --   --   --   --  7.4*   GFR: Estimated Creatinine Clearance: 8.5 mL/min (A) (by C-G formula based on SCr of 15.98 mg/dL (H)). Liver Function Tests: Recent Labs  Lab 12/11/22 0915  ALBUMIN 2.5*   No results for input(s): "LIPASE", "AMYLASE" in the last 168 hours. No results for input(s): "AMMONIA" in the last 168 hours. Coagulation Profile: No results for input(s): "INR", "PROTIME" in the last 168 hours. Cardiac Enzymes: No results for input(s): "CKTOTAL", "CKMB", "CKMBINDEX", "TROPONINI" in the last 168 hours. BNP (last 3 results) No results for input(s): "PROBNP" in the last 8760 hours. HbA1C: No results for input(s): "HGBA1C" in the last 72 hours. CBG: Recent Labs  Lab 12/12/22 1247 12/12/22 1632 12/12/22 2006 12/13/22 0008 12/13/22 0443  GLUCAP 79 110* 113* 99 88   Lipid Profile: No results for input(s): "CHOL", "HDL", "LDLCALC", "TRIG", "CHOLHDL", "LDLDIRECT" in the last 72 hours. Thyroid Function Tests: No results for input(s): "TSH", "T4TOTAL", "FREET4", "T3FREE", "THYROIDAB" in the last 72 hours. Anemia Panel: No results for input(s): "VITAMINB12", "FOLATE", "FERRITIN", "TIBC", "IRON", "RETICCTPCT" in the last 72 hours. Sepsis Labs: Recent Labs  Lab 12/06/22 1253 12/08/22 0049  LATICACIDVEN 2.7* 1.0    Recent Results (from the past 240 hour(s))  Blood culture (routine x 2)     Status: None   Collection Time: 12/06/22 12:20 PM   Specimen: BLOOD LEFT HAND  Result Value Ref Range Status   Specimen Description BLOOD LEFT HAND  Final   Special Requests   Final    BOTTLES DRAWN AEROBIC ONLY Blood Culture  results may not be optimal due to an inadequate volume of blood received in culture bottles   Culture   Final    NO GROWTH 5 DAYS Performed at [facility], [address], [place], [place] [address]    Report Status 12/11/2022 FINAL  Final  Blood culture (routine x 2)     Status: Abnormal   Collection Time: 12/06/22 12:45 PM   Specimen: BLOOD  Result Value Ref Range Status   Specimen Description BLOOD SITE NOT SPECIFIED  Final   Special Requests   Final    BOTTLES DRAWN AEROBIC AND ANAEROBIC Blood Culture results may not be optimal due to an inadequate volume of blood received in culture bottles   Culture  Setup Time   Final    GRAM POSITIVE COCCI AEROBIC BOTTLE ONLY CRITICAL RESULT CALLED TO, READ BACK BY AND VERIFIED WITH: PHARMD LAUREN BELL [id number] AT 1730 BY EC  Culture (A)  Final    STAPHYLOCOCCUS EPIDERMIDIS THE SIGNIFICANCE OF ISOLATING THIS ORGANISM FROM A SINGLE VENIPUNCTURE CANNOT BE PREDICTED WITHOUT FURTHER CLINICAL AND CULTURE CORRELATION. SUSCEPTIBILITIES AVAILABLE ONLY ON REQUEST. Performed at [facility], [address] [place], [place] [address]    Report Status 12/08/2022 FINAL  Final  Blood Culture ID Panel (Reflexed)     Status: Abnormal   Collection Time: 12/06/22 12:45 PM  Result Value Ref Range Status   Enterococcus faecalis NOT DETECTED NOT DETECTED Final   Enterococcus Faecium NOT DETECTED NOT DETECTED Final   Listeria monocytogenes NOT DETECTED NOT DETECTED Final   Staphylococcus species DETECTED (A) NOT DETECTED Final    Comment: CRITICAL RESULT CALLED TO, READ BACK BY AND VERIFIED WITH: PHARMD LAUREN BELL [id number] AT 1730 BY EC    Staphylococcus aureus (BCID) NOT DETECTED NOT DETECTED Final   Staphylococcus epidermidis DETECTED (A) NOT DETECTED Final    Comment: Methicillin (oxacillin) resistant coagulase negative staphylococcus. Possible blood culture contaminant (unless isolated from more than one blood culture draw or clinical case  suggests pathogenicity). No antibiotic treatment is indicated for blood  culture contaminants. REPEAT WITH NEW VIAL OF CTRL PHARMD LAUREN BELL [id number] AT 1730 BY EC    Staphylococcus lugdunensis NOT DETECTED NOT DETECTED Final   Streptococcus species NOT DETECTED NOT DETECTED Final   Streptococcus agalactiae NOT DETECTED NOT DETECTED Final   Streptococcus pneumoniae NOT DETECTED NOT DETECTED Final   Streptococcus pyogenes NOT DETECTED NOT DETECTED Final   A.calcoaceticus-baumannii NOT DETECTED NOT DETECTED Final   Bacteroides fragilis NOT DETECTED NOT DETECTED Final   Enterobacterales NOT DETECTED NOT DETECTED Final   Enterobacter cloacae complex NOT DETECTED NOT DETECTED Final   Escherichia coli NOT DETECTED NOT DETECTED Final   Klebsiella aerogenes NOT DETECTED NOT DETECTED Final   Klebsiella oxytoca NOT DETECTED NOT DETECTED Final   Klebsiella pneumoniae NOT DETECTED NOT DETECTED Final   Proteus species NOT DETECTED NOT DETECTED Final   Salmonella species NOT DETECTED NOT DETECTED Final   Serratia marcescens NOT DETECTED NOT DETECTED Final   Haemophilus influenzae NOT DETECTED NOT DETECTED Final   Neisseria meningitidis NOT DETECTED NOT DETECTED Final   Pseudomonas aeruginosa NOT DETECTED NOT DETECTED Final   Stenotrophomonas maltophilia NOT DETECTED NOT DETECTED Final   Candida albicans NOT DETECTED NOT DETECTED Final   Candida auris NOT DETECTED NOT DETECTED Final   Candida glabrata NOT DETECTED NOT DETECTED Final   Candida krusei NOT DETECTED NOT DETECTED Final   Candida parapsilosis NOT DETECTED NOT DETECTED Final   Candida tropicalis NOT DETECTED NOT DETECTED Final   Cryptococcus neoformans/gattii NOT DETECTED NOT DETECTED Final   Methicillin resistance mecA/C DETECTED (A) NOT DETECTED Final    Comment: CRITICAL RESULT CALLED TO, READ BACK BY AND VERIFIED WITH: Chestine Spore [id number] AT 1730 BY EC Performed at [facility], [address] [place], [place]  [address]   MRSA Next Gen by PCR, Nasal     Status: Abnormal   Collection Time: 12/06/22  4:33 PM   Specimen: Nasal Mucosa; Nasal Swab  Result Value Ref Range Status   MRSA by PCR Next Gen DETECTED (A) NOT DETECTED Final    Comment: RESULT CALLED TO, READ BACK BY AND VERIFIED WITH: 12/06/2022 AT 1836 TO RN Donney Rankins., ADC (NOTE) The GeneXpert MRSA Assay (FDA approved for NASAL specimens only), is one component of a comprehensive MRSA colonization surveillance program. It is not intended to diagnose MRSA infection nor to guide  or monitor treatment for MRSA infections. Test performance is not FDA approved in patients less than 86 years old. Performed at [facility], [address], [place], [place] [address]   Aerobic/Anaerobic Culture w Gram Stain (surgical/deep wound)     Status: None   Collection Time: 12/08/22  5:12 PM   Specimen: Path fluid; Tissue  Result Value Ref Range Status   Specimen Description WOUND  Final   Special Requests RIGHT FOOT  Final   Gram Stain   Final    RARE WBC PRESENT, PREDOMINANTLY PMN NO ORGANISMS SEEN    Culture   Final    MODERATE METHICILLIN RESISTANT STAPHYLOCOCCUS AUREUS MODERATE BACTEROIDES FRAGILIS BETA LACTAMASE POSITIVE Performed at [facility], [address] [place], [place] [address]    Report Status 12/11/2022 FINAL  Final   Organism ID, Bacteria METHICILLIN RESISTANT STAPHYLOCOCCUS AUREUS  Final      Susceptibility   Methicillin resistant staphylococcus aureus - MIC*    CIPROFLOXACIN <=0.5 SENSITIVE Sensitive     ERYTHROMYCIN >=8 RESISTANT Resistant     GENTAMICIN <=0.5 SENSITIVE Sensitive     OXACILLIN >=4 RESISTANT Resistant     TETRACYCLINE <=1 SENSITIVE Sensitive     VANCOMYCIN 1 SENSITIVE Sensitive     TRIMETH/SULFA <=10 SENSITIVE Sensitive     CLINDAMYCIN <=0.25 SENSITIVE Sensitive     RIFAMPIN <=0.5 SENSITIVE Sensitive     Inducible Clindamycin NEGATIVE Sensitive     LINEZOLID 2 SENSITIVE Sensitive     *  MODERATE METHICILLIN RESISTANT STAPHYLOCOCCUS AUREUS  Aerobic/Anaerobic Culture w Gram Stain (surgical/deep wound)     Status: None   Collection Time: 12/08/22  5:21 PM   Specimen: Path fluid; Tissue  Result Value Ref Range Status   Specimen Description WOUND  Final   Special Requests FIRST METATARSAL  Final   Gram Stain NO WBC SEEN NO ORGANISMS SEEN   Final   Culture   Final    FEW METHICILLIN RESISTANT STAPHYLOCOCCUS AUREUS FEW BACTEROIDES FRAGILIS BETA LACTAMASE POSITIVE Performed at [facility], [address] [place], [place] [address]    Report Status 12/11/2022 FINAL  Final   Organism ID, Bacteria METHICILLIN RESISTANT STAPHYLOCOCCUS AUREUS  Final      Susceptibility   Methicillin resistant staphylococcus aureus - MIC*    CIPROFLOXACIN <=0.5 SENSITIVE Sensitive     ERYTHROMYCIN >=8 RESISTANT Resistant     GENTAMICIN <=0.5 SENSITIVE Sensitive     OXACILLIN >=4 RESISTANT Resistant     TETRACYCLINE <=1 SENSITIVE Sensitive     VANCOMYCIN 1 SENSITIVE Sensitive     TRIMETH/SULFA <=10 SENSITIVE Sensitive     CLINDAMYCIN <=0.25 SENSITIVE Sensitive     RIFAMPIN <=0.5 SENSITIVE Sensitive     Inducible Clindamycin NEGATIVE Sensitive     LINEZOLID 2 SENSITIVE Sensitive     * FEW METHICILLIN RESISTANT STAPHYLOCOCCUS AUREUS  Aerobic/Anaerobic Culture w Gram Stain (surgical/deep wound)     Status: None (Preliminary result)   Collection Time: 12/12/22  9:28 AM   Specimen: Foot, Right; Amputation  Result Value Ref Range Status   Specimen Description TOE  Final   Special Requests RT FOOT  Final   Gram Stain   Final    NO WBC SEEN NO ORGANISMS SEEN Performed at [facility], [address], [place], [place] [address]    Culture PENDING  Incomplete   Report Status PENDING  Incomplete         Radiology Studies: DG Foot Complete Right  Result Date: 12/12/2022 CLINICAL DATA:  Osteomyelitis of right foot.  PACU wound check. EXAM: RIGHT FOOT COMPLETE - 3+ VIEW  COMPARISON:  Right foot radiographs 12/08/2022; MRI right forefoot 11/26/2022 FINDINGS: Interval amputation of the first ray to the level of the mid shaft. There is subcutaneous air within the postsurgical distal surgical site, likely within normal limits recent surgery. The first metatarsal amputation margin is sharp. Mild plantar calcaneal heel spur. IMPRESSION: Interval amputation of the first ray to the level of the mid shaft. No complication is seen. Electronically Signed   By: Neita Garnet M.D.   On: 12/12/2022 11:29        Scheduled Meds:  alteplase  4 mg Intracatheter Once   apixaban  5 mg Oral BID   atorvastatin  40 mg Oral Daily   Chlorhexidine Gluconate Cloth  6 each Topical Daily   darbepoetin (ARANESP) injection - DIALYSIS  60 mcg Subcutaneous Q Thu-1800   dextrose  1 ampule Intravenous Once   doxercalciferol  6 mcg Intravenous Q T,Th,Sa-HD   ferric citrate  630 mg Oral TID WC   insulin aspart  0-6 Units Subcutaneous Q4H   metroNIDAZOLE  500 mg Oral Q12H   midodrine  15 mg Oral Q8H   oxybutynin  5 mg Oral TID   vancomycin variable dose per unstable renal function (pharmacist dosing)   Does not apply See admin instructions   Continuous Infusions:  sodium chloride 0  (12/08/22 1726)   anticoagulant sodium citrate       LOS: 7 days    Time spent: 35 minutes    [name], MD [facility]

## 2022-12-14 DIAGNOSIS — M86171 Other acute osteomyelitis, right ankle and foot: Secondary | ICD-10-CM | POA: Diagnosis not present

## 2022-12-14 DIAGNOSIS — N186 End stage renal disease: Secondary | ICD-10-CM | POA: Diagnosis not present

## 2022-12-14 DIAGNOSIS — Z992 Dependence on renal dialysis: Secondary | ICD-10-CM | POA: Diagnosis not present

## 2022-12-14 LAB — GLUCOSE, CAPILLARY
Glucose-Capillary: 115 mg/dL — ABNORMAL HIGH (ref 70–99)
Glucose-Capillary: 82 mg/dL (ref 70–99)
Glucose-Capillary: 89 mg/dL (ref 70–99)

## 2022-12-14 LAB — AEROBIC/ANAEROBIC CULTURE W GRAM STAIN (SURGICAL/DEEP WOUND): Gram Stain: NONE SEEN

## 2022-12-14 NOTE — Progress Notes (Signed)
PROGRESS NOTE    [name]  QMV:[id number] DOB: [birth year] DOA: 12/06/2022 PCP: [name], MD    Brief Narrative:  47 year old ESRD on hemodialysis TTS with right femoral TDC, chronic Eliquis therapy for paroxysmal A-fib and clotted fistulas, sleep apnea not using CPAP presented to the hospital with 2 months of right foot ulcer not healing.  In the emergency room initially with low blood pressures, admitted to ICU, subsequently started on midodrine and transferred out of ICU.  Patient remains in the hospital for surgical intervention and IV antibiotics. He does have chronically low blood pressures and mostly asymptomatic.    Assessment & Plan:   Severe sepsis from right foot ulcer/abscess and osteomyelitis: Incision and drainage 7/1, surgical cultures with MRSA Persistent infection, status post right great toe ray amputation 7/5.   Blood cultures with Staph epidermidis. Surgical cultures with MRSA. Seen by ID.  Recommended vancomycin for 4 weeks with hemodialysis, metronidazole 500 mg twice daily for 4 weeks. Surgical shoe.  Weightbearing with heel.  Work with PT OT today.  Chronic hypotension: Asymptomatic.  On midodrine 15 mg 3 times daily.  No contraindication to work with PT OT.  ESRD on hemodialysis, hyperkalemia: Improved.  Getting dialysis on his schedule.  Chronic diastolic heart failure: Euvolemic.  OSA not on CPAP: Refusing CPAP at night.  PAF on Eliquis: Normal sinus rhythm.  Not on any rate control medications.  Resume Eliquis today.  Hypoglycemia: Due to prolonged n.p.o. status.  On dextrose infusion, regular diet.  Stable.  Improved. Discontinue sugar checks.   DVT prophylaxis: SCDs Start: 12/06/22 1433 apixaban (ELIQUIS) tablet 5 mg   Code Status: Full code.  Family Communication: None at the bedside Disposition Plan: Status is: Inpatient Remains inpatient appropriate because: Active surgical interventions.  Postoperative status.      Consultants:  Podiatry Nephrology Infectious disease.  Procedures:  I&D of the right foot  Antimicrobials:  Vancomycin  7/1--- Flagyl 7/4---   Subjective:  Seen and examined.  Denies any complaints.  He was wondering about not getting vancomycin after dialysis yesterday.  His levels were supratherapeutic.  Pharmacy on board.  Less suprapubic spasms now. He is tired of fingersticks.   Objective: Vitals:   12/14/22 0029 12/14/22 0445 12/14/22 0448 12/14/22 0756  BP: (!) 72/48 (!) 80/53  (!) 74/50  Pulse: 90 72  72  Resp: 16 18  17   Temp: 97.8 F (36.6 C) 97.9 F (36.6 C)  98.1 F (36.7 C)  TempSrc: Oral Axillary  Oral  SpO2: 90% 98%  95%  Weight:   (!) 143.9 kg   Height:        Intake/Output Summary (Last 24 hours) at 12/14/2022 1107 Last data filed at 12/13/2022 1227 Gross per 24 hour  Intake --  Output 3000 ml  Net -3000 ml    Filed Weights   12/13/22 0658 12/13/22 1229 12/14/22 0448  Weight: (!) 142.6 kg (!) 141.1 kg (!) 143.9 kg    Examination:  General: Fairly comfortable.  On room air. Cardiovascular: S1-S2 normal.  Regular rate rate rhythm. Respiratory: Bilateral clear.  No added sounds. Gastrointestinal: Soft.  Nontender. Ext: Right femoral AV graft, hemodialysis catheter left groin. Right foot with postop dressing, looks clean and dry. Neuro: Alert awake and oriented. Musculoskeletal: No deformities.      Data Reviewed: I have personally reviewed following labs and imaging studies  CBC: Recent Labs  Lab 12/08/22 1633 12/09/22 0127 12/10/22 0046 12/11/22 0915  WBC  --  15.0*  13.7* 13.9*  HGB 12.6* 9.6* 9.6* 9.4*  HCT 37.0* 30.0* 30.1* 29.3*  MCV  --  91.7 89.9 92.1  PLT  --  218 231 295    Basic Metabolic Panel: Recent Labs  Lab 12/08/22 0049 12/08/22 1633 12/08/22 1744 12/09/22 0127 12/10/22 0046 12/11/22 0915  NA 130* 127* 130* 130* 129* 131*  K 5.5* 6.1* 5.4* 5.5* 5.1 5.2*  CL 92* 97* 91* 90* 91* 91*  CO2 20*  --   22 20* 22 22  GLUCOSE 99 101* 108* 106* 88 103*  BUN 61* 64* 69* 76* 58* 80*  CREATININE 15.72* 16.90* 16.85* 17.52* 13.67* 15.98*  CALCIUM 9.2  --  9.1 9.5 9.5 9.8  MG  --   --   --  2.1 2.1  --   PHOS  --   --   --   --   --  7.4*    GFR: Estimated Creatinine Clearance: 8.5 mL/min (A) (by C-G formula based on SCr of 15.98 mg/dL (H)). Liver Function Tests: Recent Labs  Lab 12/11/22 0915  ALBUMIN 2.5*    No results for input(s): "LIPASE", "AMYLASE" in the last 168 hours. No results for input(s): "AMMONIA" in the last 168 hours. Coagulation Profile: No results for input(s): "INR", "PROTIME" in the last 168 hours. Cardiac Enzymes: No results for input(s): "CKTOTAL", "CKMB", "CKMBINDEX", "TROPONINI" in the last 168 hours. BNP (last 3 results) No results for input(s): "PROBNP" in the last 8760 hours. HbA1C: No results for input(s): "HGBA1C" in the last 72 hours. CBG: Recent Labs  Lab 12/13/22 1557 12/13/22 2024 12/14/22 0031 12/14/22 0450 12/14/22 0753  GLUCAP 104* 118* 115* 89 82    Lipid Profile: No results for input(s): "CHOL", "HDL", "LDLCALC", "TRIG", "CHOLHDL", "LDLDIRECT" in the last 72 hours. Thyroid Function Tests: No results for input(s): "TSH", "T4TOTAL", "FREET4", "T3FREE", "THYROIDAB" in the last 72 hours. Anemia Panel: No results for input(s): "VITAMINB12", "FOLATE", "FERRITIN", "TIBC", "IRON", "RETICCTPCT" in the last 72 hours. Sepsis Labs: Recent Labs  Lab 12/08/22 0049  LATICACIDVEN 1.0     Recent Results (from the past 240 hour(s))  Blood culture (routine x 2)     Status: None   Collection Time: 12/06/22 12:20 PM   Specimen: BLOOD LEFT HAND  Result Value Ref Range Status   Specimen Description BLOOD LEFT HAND  Final   Special Requests   Final    BOTTLES DRAWN AEROBIC ONLY Blood Culture results may not be optimal due to an inadequate volume of blood received in culture bottles   Culture   Final    NO GROWTH 5 DAYS Performed at [facility], [address] [place], [place] [address]    Report Status 12/11/2022 FINAL  Final  Blood culture (routine x 2)     Status: Abnormal   Collection Time: 12/06/22 12:45 PM   Specimen: BLOOD  Result Value Ref Range Status   Specimen Description BLOOD SITE NOT SPECIFIED  Final   Special Requests   Final    BOTTLES DRAWN AEROBIC AND ANAEROBIC Blood Culture results may not be optimal due to an inadequate volume of blood received in culture bottles   Culture  Setup Time   Final    GRAM POSITIVE COCCI AEROBIC BOTTLE ONLY CRITICAL RESULT CALLED TO, READ BACK BY AND VERIFIED WITH: PHARMD LAUREN BELL [id number] AT 1730 BY EC    Culture (A)  Final    STAPHYLOCOCCUS EPIDERMIDIS THE SIGNIFICANCE OF ISOLATING THIS ORGANISM FROM A SINGLE VENIPUNCTURE  CANNOT BE PREDICTED WITHOUT FURTHER CLINICAL AND CULTURE CORRELATION. SUSCEPTIBILITIES AVAILABLE ONLY ON REQUEST. Performed at [facility], [address] [place], [place] [address]    Report Status 12/08/2022 FINAL  Final  Blood Culture ID Panel (Reflexed)     Status: Abnormal   Collection Time: 12/06/22 12:45 PM  Result Value Ref Range Status   Enterococcus faecalis NOT DETECTED NOT DETECTED Final   Enterococcus Faecium NOT DETECTED NOT DETECTED Final   Listeria monocytogenes NOT DETECTED NOT DETECTED Final   Staphylococcus species DETECTED (A) NOT DETECTED Final    Comment: CRITICAL RESULT CALLED TO, READ BACK BY AND VERIFIED WITH: PHARMD LAUREN BELL [id number] AT 1730 BY EC    Staphylococcus aureus (BCID) NOT DETECTED NOT DETECTED Final   Staphylococcus epidermidis DETECTED (A) NOT DETECTED Final    Comment: Methicillin (oxacillin) resistant coagulase negative staphylococcus. Possible blood culture contaminant (unless isolated from more than one blood culture draw or clinical case suggests pathogenicity). No antibiotic treatment is indicated for blood  culture contaminants. REPEAT WITH NEW VIAL OF CTRL PHARMD LAUREN BELL [id number] AT  1730 BY EC    Staphylococcus lugdunensis NOT DETECTED NOT DETECTED Final   Streptococcus species NOT DETECTED NOT DETECTED Final   Streptococcus agalactiae NOT DETECTED NOT DETECTED Final   Streptococcus pneumoniae NOT DETECTED NOT DETECTED Final   Streptococcus pyogenes NOT DETECTED NOT DETECTED Final   A.calcoaceticus-baumannii NOT DETECTED NOT DETECTED Final   Bacteroides fragilis NOT DETECTED NOT DETECTED Final   Enterobacterales NOT DETECTED NOT DETECTED Final   Enterobacter cloacae complex NOT DETECTED NOT DETECTED Final   Escherichia coli NOT DETECTED NOT DETECTED Final   Klebsiella aerogenes NOT DETECTED NOT DETECTED Final   Klebsiella oxytoca NOT DETECTED NOT DETECTED Final   Klebsiella pneumoniae NOT DETECTED NOT DETECTED Final   Proteus species NOT DETECTED NOT DETECTED Final   Salmonella species NOT DETECTED NOT DETECTED Final   Serratia marcescens NOT DETECTED NOT DETECTED Final   Haemophilus influenzae NOT DETECTED NOT DETECTED Final   Neisseria meningitidis NOT DETECTED NOT DETECTED Final   Pseudomonas aeruginosa NOT DETECTED NOT DETECTED Final   Stenotrophomonas maltophilia NOT DETECTED NOT DETECTED Final   Candida albicans NOT DETECTED NOT DETECTED Final   Candida auris NOT DETECTED NOT DETECTED Final   Candida glabrata NOT DETECTED NOT DETECTED Final   Candida krusei NOT DETECTED NOT DETECTED Final   Candida parapsilosis NOT DETECTED NOT DETECTED Final   Candida tropicalis NOT DETECTED NOT DETECTED Final   Cryptococcus neoformans/gattii NOT DETECTED NOT DETECTED Final   Methicillin resistance mecA/C DETECTED (A) NOT DETECTED Final    Comment: CRITICAL RESULT CALLED TO, READ BACK BY AND VERIFIED WITH: Chestine Spore [id number] AT 1730 BY EC Performed at [facility], [address], [place], [place] [address]   MRSA Next Gen by PCR, Nasal     Status: Abnormal   Collection Time: 12/06/22  4:33 PM   Specimen: Nasal Mucosa; Nasal Swab  Result Value Ref Range  Status   MRSA by PCR Next Gen DETECTED (A) NOT DETECTED Final    Comment: RESULT CALLED TO, READ BACK BY AND VERIFIED WITH: 12/06/2022 AT 1836 TO RN Donney Rankins., ADC (NOTE) The GeneXpert MRSA Assay (FDA approved for NASAL specimens only), is one component of a comprehensive MRSA colonization surveillance program. It is not intended to diagnose MRSA infection nor to guide or monitor treatment for MRSA infections. Test performance is not FDA approved in patients less than 47 years old.  Performed at [facility], [address] [place], [place] [address]   Aerobic/Anaerobic Culture w Gram Stain (surgical/deep wound)     Status: None   Collection Time: 12/08/22  5:12 PM   Specimen: Path fluid; Tissue  Result Value Ref Range Status   Specimen Description WOUND  Final   Special Requests RIGHT FOOT  Final   Gram Stain   Final    RARE WBC PRESENT, PREDOMINANTLY PMN NO ORGANISMS SEEN    Culture   Final    MODERATE METHICILLIN RESISTANT STAPHYLOCOCCUS AUREUS MODERATE BACTEROIDES FRAGILIS BETA LACTAMASE POSITIVE Performed at [facility], [address] [place], [place] [address]    Report Status 12/11/2022 FINAL  Final   Organism ID, Bacteria METHICILLIN RESISTANT STAPHYLOCOCCUS AUREUS  Final      Susceptibility   Methicillin resistant staphylococcus aureus - MIC*    CIPROFLOXACIN <=0.5 SENSITIVE Sensitive     ERYTHROMYCIN >=8 RESISTANT Resistant     GENTAMICIN <=0.5 SENSITIVE Sensitive     OXACILLIN >=4 RESISTANT Resistant     TETRACYCLINE <=1 SENSITIVE Sensitive     VANCOMYCIN 1 SENSITIVE Sensitive     TRIMETH/SULFA <=10 SENSITIVE Sensitive     CLINDAMYCIN <=0.25 SENSITIVE Sensitive     RIFAMPIN <=0.5 SENSITIVE Sensitive     Inducible Clindamycin NEGATIVE Sensitive     LINEZOLID 2 SENSITIVE Sensitive     * MODERATE METHICILLIN RESISTANT STAPHYLOCOCCUS AUREUS  Aerobic/Anaerobic Culture w Gram Stain (surgical/deep wound)     Status: None   Collection Time: 12/08/22   5:21 PM   Specimen: Path fluid; Tissue  Result Value Ref Range Status   Specimen Description WOUND  Final   Special Requests FIRST METATARSAL  Final   Gram Stain NO WBC SEEN NO ORGANISMS SEEN   Final   Culture   Final    FEW METHICILLIN RESISTANT STAPHYLOCOCCUS AUREUS FEW BACTEROIDES FRAGILIS BETA LACTAMASE POSITIVE Performed at [facility], [address] [place], [place] [address]    Report Status 12/11/2022 FINAL  Final   Organism ID, Bacteria METHICILLIN RESISTANT STAPHYLOCOCCUS AUREUS  Final      Susceptibility   Methicillin resistant staphylococcus aureus - MIC*    CIPROFLOXACIN <=0.5 SENSITIVE Sensitive     ERYTHROMYCIN >=8 RESISTANT Resistant     GENTAMICIN <=0.5 SENSITIVE Sensitive     OXACILLIN >=4 RESISTANT Resistant     TETRACYCLINE <=1 SENSITIVE Sensitive     VANCOMYCIN 1 SENSITIVE Sensitive     TRIMETH/SULFA <=10 SENSITIVE Sensitive     CLINDAMYCIN <=0.25 SENSITIVE Sensitive     RIFAMPIN <=0.5 SENSITIVE Sensitive     Inducible Clindamycin NEGATIVE Sensitive     LINEZOLID 2 SENSITIVE Sensitive     * FEW METHICILLIN RESISTANT STAPHYLOCOCCUS AUREUS  Aerobic/Anaerobic Culture w Gram Stain (surgical/deep wound)     Status: None (Preliminary result)   Collection Time: 12/12/22  9:28 AM   Specimen: Foot, Right; Amputation  Result Value Ref Range Status   Specimen Description TOE  Final   Special Requests RT FOOT  Final   Gram Stain NO WBC SEEN NO ORGANISMS SEEN   Final   Culture   Final    RARE STAPHYLOCOCCUS AUREUS SUSCEPTIBILITIES TO FOLLOW Performed at [facility], [address], [place], [place] [address]    Report Status PENDING  Incomplete         Radiology Studies: No results found.      Scheduled Meds:  apixaban  5 mg Oral BID  atorvastatin  40 mg Oral Daily   Chlorhexidine Gluconate Cloth  6 each Topical Daily   darbepoetin (ARANESP) injection - DIALYSIS  60 mcg Subcutaneous Q Thu-1800   dextrose  1 ampule Intravenous  Once   doxercalciferol  6 mcg Intravenous Q T,Th,Sa-HD   ferric citrate  630 mg Oral TID WC   metroNIDAZOLE  500 mg Oral Q12H   midodrine  15 mg Oral Q8H   oxybutynin  5 mg Oral TID   vancomycin variable dose per unstable renal function (pharmacist dosing)   Does not apply See admin instructions   Continuous Infusions:  sodium chloride 0  (12/08/22 1726)     LOS: 8 days    Time spent: 35 minutes    [name], MD [facility]

## 2022-12-14 NOTE — Progress Notes (Signed)
      INFECTIOUS DISEASE ATTENDING ADDENDUM:   Date: 12/14/2022  Patient name: [name] [facility]  Medical record number: [id number]  Date of birth: 03/09/1976   Cultures from amputation have yielded MRSA.  See prior note for plan.  I will sign off please call with further questions.   Acey Lav 12/14/2022, 3:33 PM

## 2022-12-14 NOTE — Progress Notes (Signed)
[facility] Progress Note   Subjective:  Completed dialysis yesterday net UF 3L. Seen in room - no new issues. Soft blood pressures but not symptomatic. Foot pain controlled.   Objective Vitals:   12/14/22 0029 12/14/22 0445 12/14/22 0448 12/14/22 0756  BP: (!) 72/48 (!) 80/53  (!) 74/50  Pulse: 90 72  72  Resp: 16 18  17   Temp: 97.8 F (36.6 C) 97.9 F (36.6 C)  98.1 F (36.7 C)  TempSrc: Oral Axillary  Oral  SpO2: 90% 98%  95%  Weight:   (!) 143.9 kg   Height:       Physical Exam General: Well appearing man, NAD. Room air Heart: RRR; no murmur Lungs: CTA anteriorly Abdomen: soft Extremities: Trace LE edema, R foot bandaged Dialysis Access: L femoral TDC  Additional Objective Labs: Basic Metabolic Panel: Recent Labs  Lab 12/09/22 0127 12/10/22 0046 12/11/22 0915  NA 130* 129* 131*  K 5.5* 5.1 5.2*  CL 90* 91* 91*  CO2 20* 22 22  GLUCOSE 106* 88 103*  BUN 76* 58* 80*  CREATININE 17.52* 13.67* 15.98*  CALCIUM 9.5 9.5 9.8  PHOS  --   --  7.4*    Liver Function Tests: Recent Labs  Lab 12/11/22 0915  ALBUMIN 2.5*    CBC: Recent Labs  Lab 12/09/22 0127 12/10/22 0046 12/11/22 0915  WBC 15.0* 13.7* 13.9*  HGB 9.6* 9.6* 9.4*  HCT 30.0* 30.1* 29.3*  MCV 91.7 89.9 92.1  PLT 218 231 295    Blood Culture    Component Value Date/Time   SDES TOE 12/12/2022 0928   SPECREQUEST RT FOOT 12/12/2022 0928   CULT  12/12/2022 0928    RARE STAPHYLOCOCCUS AUREUS SUSCEPTIBILITIES TO FOLLOW Performed at [facility], [address], [place], [place] [address]    REPTSTATUS PENDING 12/12/2022 6045   Studies/Results: DG Foot Complete Right  Result Date: 12/12/2022 CLINICAL DATA:  Osteomyelitis of right foot.  PACU wound check. EXAM: RIGHT FOOT COMPLETE - 3+ VIEW COMPARISON:  Right foot radiographs 12/08/2022; MRI right forefoot 11/26/2022 FINDINGS: Interval amputation of the first ray to the level of the mid shaft. There is subcutaneous air  within the postsurgical distal surgical site, likely within normal limits recent surgery. The first metatarsal amputation margin is sharp. Mild plantar calcaneal heel spur. IMPRESSION: Interval amputation of the first ray to the level of the mid shaft. No complication is seen. Electronically Signed   By: Neita Garnet M.D.   On: 12/12/2022 11:29    Medications:  sodium chloride 0  (12/08/22 1726)    apixaban  5 mg Oral BID   atorvastatin  40 mg Oral Daily   Chlorhexidine Gluconate Cloth  6 each Topical Daily   darbepoetin (ARANESP) injection - DIALYSIS  60 mcg Subcutaneous Q Thu-1800   dextrose  1 ampule Intravenous Once   doxercalciferol  6 mcg Intravenous Q T,Th,Sa-HD   ferric citrate  630 mg Oral TID WC   insulin aspart  0-6 Units Subcutaneous Q4H   metroNIDAZOLE  500 mg Oral Q12H   midodrine  15 mg Oral Q8H   oxybutynin  5 mg Oral TID   vancomycin variable dose per unstable renal function (pharmacist dosing)   Does not apply See admin instructions   Assessment/ Plan: Pt is a 47 y.o. yo male with a past medical history significant for ESRD on HD, chronic hypotension, A-fib, CHF admitted with sepsis and a right foot infection.   OP HD: TTS  HD  4.5h   500/ 500  140kg   2/2 bath  TDC  Hep 5000+ - hectorol 6 mcg IV three times per week - parsabiv 7.5 mg IV three times per week - no esa   # Sepsis due to right foot infection/possible abscess: On Vanc + metronidazole. S/p R foot I&D 7/1 -> Cx growing MRSA. S/p 1st ray amputation 7/5. Needs 4 weeks of vancomycin per ID.    # ESRD TTS: Next HD 7/9   # Chronic hypotension: SBP runs 70-80 range per patient, asymptomatic. Currently on midodrine 15mg  TID.   # Anemia of CKD: Hgb 9.4 - Aranesp q Thurs (last 7/4).    # CKD-MBD: Continue Auryxia, Hectorol. Parsabiv not on formulary here.  Monitor labs.   # Mild hyperkalemia: Improved with HD, monitor.   # A-Fib: On Eliquis  Robert Blase PA-C [facility] 12/14/2022,9:11 AM

## 2022-12-14 NOTE — Progress Notes (Signed)
Mobility Specialist Progress Note   12/14/22 1715  Mobility  Activity Ambulated with assistance in hallway  Level of Assistance Standby assist, set-up cues, supervision of patient - no hands on  Assistive Device Front wheel walker  Distance Ambulated (ft) 230 ft  RLE Weight Bearing PWB  RLE Partial Weight Bearing Percentage or Pounds  (Heel only w/ Darco shoe)  Activity Response Tolerated well  Mobility Referral Yes  $Mobility charge 1 Mobility  Mobility Specialist Start Time (ACUTE ONLY) 1645  Mobility Specialist Stop Time (ACUTE ONLY) 1720  Mobility Specialist Time Calculation (min) (ACUTE ONLY) 35 min   Pre Mobility: 88 HR During Mobility: 144 HR Post Mobility: 92 HR  Received pt in bed having no c/o pain and agreeable to mobility. Pt able to get EOB and donn Darco shoe w/ minA. Pt adamant on independency throughout session and deferring most assistance. Pt understand WB precautions but having difficulty ambulating on heel in Darco stating "it just feels unnatural". Pt able to ambulate back to room w/o fault or LOB but HR was tachy floating around 143bpm, pt c/o fatigue. Once back in bed, encouraged pursed lip breathing  for ~61mins and pt's HR slowly stabilized. Left w/ call bell in reach, bed alarm on and RN notified.  Frederico Hamman Mobility Specialist Please contact via SecureChat or  Rehab office at [phone]

## 2022-12-14 NOTE — Progress Notes (Signed)
Subjective:  Patient ID: [name], male    DOB: Aug 22, 1975,  MRN: [id number]  Patient POD#2  s/p right foot partial ray amputation. Patient relates doing ok and pain improved.   Denies n/v/f/c.   Past Medical History:  Diagnosis Date   Acute respiratory failure with hypoxia (HCC) 11/30/2018   Anemia    ESRD   ESRD on hemodialysis (HCC) 06/07/2011   TTS [place]. Started HD in 2006, got transplant in 2014 lasted until Dec 2019 then went back on HD.  Has L thigh AVG as of Jun 2020.  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    History of blood transfusion    Hypertension    03/19/22- has not had high blood pressure in 5 years.   Morbid obesity (HCC)    Paroxysmal atrial fibrillation (HCC)    Pre-diabetes    no meds   Sepsis (HCC) 11/30/2018   Sleep apnea    lost weight, does not use CPAP     Past Surgical History:  Procedure Laterality Date   AMPUTATION Right 12/12/2022   Procedure: AMPUTATION RAY;  Surgeon: Louann Sjogren, DPM;  Location: [facility] OR;  Service: Orthopedics/Podiatry;  Laterality: Right;  surgical team to do local block   ARTERIOVENOUS GRAFT PLACEMENT  08/09/2010   Left Thigh Graft by Dr. Cari Caraway   AV FISTULA PLACEMENT Right 05/18/2018   Procedure: INSERTION OF ARTERIOVENOUS (AV) GORE-TEX GRAFT Left THIGH;  Surgeon: Maeola Harman, MD;  Location: [facility] OR;  Service: Vascular;  Laterality: Right;   AV FISTULA PLACEMENT Right 02/15/2021   Procedure: INSERTION OF ARTERIOVENOUS (AV) GORE-TEX LOOP GRAFT RIGHT THIGH;  Surgeon: Maeola Harman, MD;  Location: [facility] OR;  Service: Vascular;  Laterality: Right;   BIOPSY  05/20/2022   Procedure: BIOPSY;  Surgeon: Shellia Cleverly, DO;  Location: [facility];  Service: Gastroenterology;;   CAPD INSERTION N/A 06/13/2022   Procedure: LAPAROSCOPIC INSERTION CONTINUOUS AMBULATORY PERITONEAL DIALYSIS  (CAPD) CATHETER;  Surgeon: Maeola Harman, MD;  Location: [facility] OR;  Service: Vascular;   Laterality: N/A;   CAPD REMOVAL  05/08/2011   Procedure: CONTINUOUS AMBULATORY PERITONEAL DIALYSIS  (CAPD) CATHETER REMOVAL;  Surgeon: Iona Coach, MD;  Location: [facility] OR;  Service: General;  Laterality: N/A;  Removal of CAPD catheter, Dr. requests to go after 100   CAPD REMOVAL N/A 08/08/2022   Procedure: REMOVAL CONTINUOUS AMBULATORY PERITONEAL DIALYSIS  (CAPD) CATHETER;  Surgeon: Maeola Harman, MD;  Location: [facility] OR;  Service: Vascular;  Laterality: N/A;   COLONOSCOPY WITH PROPOFOL N/A 05/20/2022   Procedure: COLONOSCOPY WITH PROPOFOL;  Surgeon: Shellia Cleverly, DO;  Location: [facility];  Service: Gastroenterology;  Laterality: N/A;   I & D EXTREMITY Right 12/08/2022   Procedure: IRRIGATION AND DEBRIDEMENT RIGHT FOOT;  Surgeon: Louann Sjogren, DPM;  Location: [facility] OR;  Service: Orthopedics/Podiatry;  Laterality: Right;   INSERTION OF DIALYSIS CATHETER  10/05/2010   Right Femoral Cath insertion by Dr. Leonides Sake.  Pt ahas had several caths inserted.   INSERTION OF DIALYSIS CATHETER Right 02/15/2021   Procedure: Attempted INSERTION OF RIGHT and Left Internal Jugular DIALYSIS CATHETER, Insertion of Left Femoral Vein Dialysis Catheter;  Surgeon: Maeola Harman, MD;  Location: [facility] OR;  Service: Vascular;  Laterality: Right;   INSERTION OF DIALYSIS CATHETER Right 04/26/2021   Procedure: INSERTION OF TUNNELED 55cm PALIDROME PRECISION CHRONIC DIALYSIS CATHETER;  Surgeon: Leonie Douglas, MD;  Location: [facility] OR;  Service: Vascular;  Laterality: Right;  INSERTION OF DIALYSIS CATHETER Left 12/26/2021   Procedure: INSERTION OF TUNNELED  DIALYSIS CATHETER LEFT FEMORAL ARTERY;  Surgeon: Maeola Harman, MD;  Location: [facility] OR;  Service: Vascular;  Laterality: Left;   INSERTION OF DIALYSIS CATHETER Left 03/06/2022   Procedure: INSERTION OF DIALYSIS CATHETER;  Surgeon: Maeola Harman, MD;  Location: [facility] OR;  Service: Vascular;  Laterality: Left;   IR FLUORO GUIDE CV  LINE LEFT  04/23/2022   IR FLUORO GUIDE CV LINE RIGHT  05/11/2018   IR FLUORO GUIDE CV LINE RIGHT  07/03/2021   IR FLUORO GUIDE CV LINE RIGHT  07/16/2021   IR PTA ADDL CENTRAL DIALYSIS SEG THRU DIALY CIRCUIT RIGHT Right 07/03/2021   IR US GUIDE VASC ACCESS LEFT  04/23/2022   IR US GUIDE VASC ACCESS RIGHT  05/11/2018   IR VENO/EXT/UNI LEFT  04/23/2022   IR VENOCAVAGRAM IVC  07/16/2021   KIDNEY TRANSPLANT  2014   KNEE ARTHROSCOPY Left    LAPAROSCOPIC LYSIS OF ADHESIONS N/A 06/13/2022   Procedure: LAPAROSCOPIC LYSIS OF ADHESIONS;  Surgeon: Maeola Harman, MD;  Location: [facility] OR;  Service: Vascular;  Laterality: N/A;   POLYPECTOMY  05/20/2022   Procedure: POLYPECTOMY;  Surgeon: Shellia Cleverly, DO;  Location: [facility];  Service: Gastroenterology;;   THROMBECTOMY W/ EMBOLECTOMY Right 04/26/2021   Procedure: REMOVAL OF RIGHT THIGH ARTERIOVENOUS GORE-TEX GRAFT AND REPAIR OF RIGHT COMMON FEMORAL ARTERY;  Surgeon: Leonie Douglas, MD;  Location: [facility] OR;  Service: Vascular;  Laterality: Right;   UPPER EXTREMITY ANGIOGRAPHY Bilateral 05/13/2018   Procedure: UPPER EXTREMITY ANGIOGRAPHY - bilarteral;  Surgeon: Cephus Shelling, MD;  Location: [facility];  Service: Cardiovascular;  Laterality: Bilateral;       Latest Ref Rng & Units 12/11/2022    9:15 AM 12/10/2022   12:46 AM 12/09/2022    1:27 AM  CBC  WBC 4.0 - 10.5 K/uL 13.9  13.7  15.0   Hemoglobin 13.0 - 17.0 g/dL 9.4  9.6  9.6   Hematocrit 39.0 - 52.0 % 29.3  30.1  30.0   Platelets 150 - 400 K/uL 295  231  218        Latest Ref Rng & Units 12/11/2022    9:15 AM 12/10/2022   12:46 AM 12/09/2022    1:27 AM  BMP  Glucose 70 - 99 mg/dL 161  88  096   BUN 6 - 20 mg/dL 80  58  76   Creatinine 0.61 - 1.24 mg/dL 04.54  09.81  19.14   Sodium 135 - 145 mmol/L 131  129  130   Potassium 3.5 - 5.1 mmol/L 5.2  5.1  5.5   Chloride 98 - 111 mmol/L 91  91  90   CO2 22 - 32 mmol/L 22  22  20    Calcium 8.9 - 10.3 mg/dL 9.8  9.5  9.5       Objective:   Vitals:   12/14/22 0756 12/14/22 1149  BP: (!) 74/50 (!) 92/56  Pulse: 72 75  Resp: 17   Temp: 98.1 F (36.7 C) 98.6 F (37 C)  SpO2: 95% 94%    General:AA&O x 3. Normal mood and affect   Vascular: DP and PT pulses 2/4 bilateral. Brisk capillary refill to all digits. Pedal hair present   Neruological. Epicritic sensation grossly intact.   Derm: Surgical incision well coapted with distal and proximal inciision with small opening for drainage. No purulence noted  Edema and erythema surrounding plantar  wound and dorsal foot improved.    MSK: MMT 5/5 in dorsiflexion, plantar flexion, inversion and eversion. Normal joint ROM without pain or crepitus.    Vascular:  VAS [place] ABI W/WO TBI 07/25/2022  ABI Findings:  +---------+------------------+-----+---------+--------+  Right   Rt Pressure (mmHg)IndexWaveform Comment   +---------+------------------+-----+---------+--------+  Brachial 94                                        +---------+------------------+-----+---------+--------+  PTA     111               1.13 triphasic          +---------+------------------+-----+---------+--------+  DP      103               1.05 triphasic          +---------+------------------+-----+---------+--------+  Great Toe103               1.05                    +---------+------------------+-----+---------+--------+   +---------+------------------+-----+---------------------------+-------+  Left    Lt Pressure (mmHg)IndexWaveform                   Comment  +---------+------------------+-----+---------------------------+-------+  Brachial 98                                                         +---------+------------------+-----+---------------------------+-------+  PTA     112               1.14 triphasic                           +---------+------------------+-----+---------------------------+-------+  DP      99                 1.01 triphasic                           +---------+------------------+-----+---------------------------+-------+  Great Toe                       2nd toe waveform is present         +---------+------------------+-----+---------------------------+-------+   +-------+-----------+--------------------------------+------------+--------  ----+  ABI/TBIToday's ABIToday's TBI                     Previous  ABIPrevious TBI  +-------+-----------+--------------------------------+------------+--------  ----+  Right 1.13       1.05                            1.24        0.66           +-------+-----------+--------------------------------+------------+--------  ----+  Left  1.14       great toe ulcer/2nd toe         1.16        1.16                             waveforms present.                                         +-------+-----------+--------------------------------+------------+--------  ----+  Previous ABI at [facility] on 12/28/20.  Summary:  Right: Resting right ankle-brachial index is within normal range. The right toe-brachial index is normal.  Left: Resting left ankle-brachial index is within normal range. Great toe ulcer. Second toe waveform is present   DG FOOT COMPLETE RIGHT 12/06/2022 FINDINGS: No fracture or dislocation. Normal mineralization. Mild hallux valgus deformity. Small calcaneal spurs. No subcutaneous gas or radiodense foreign body.   IMPRESSION: 1. No acute findings. 2. Mild hallux valgus.   MR FOOT RIGHT WO CONTRAST 12/06/2022 IMPRESSION: 1. Small plantar skin ulcer at the level of the great toe metatarsophalangeal joint with associated soft tissue edema and swelling about the medial greater than lateral aspects of the great toe and medial greater than lateral aspects of the midfoot consistent with cellulitis. There are regions of coalescing fluid medial to the great toe metatarsophalangeal joints and dorsal medial to the length  of the proximal phalanx of the great toe suspicious for abscesses. 2. No cortical erosion or marrow edema is seen to indicate MRI evidence of acute osteomyelitis.  Assessment & Plan:  Patient was evaluated and treated and all questions answered.  DX: Right foot abscess and diabetic foot infection Dressing changed at bedside. No purulence or signs of infection noted.  Antibiotics: Continue IV cefepime. Will follow cultures. Appreciate ID reccs.  Patient may be heel weight bearing to operative extremity in surgical shoe.  Infection appears controlled.   Patient ok for discharge from podiatry standpoint.  Will plan for follow-up in our clinic in about a week and we will schedule.   Louann Sjogren, DPM  Accessible via secure chat for questions or concerns.

## 2022-12-15 DIAGNOSIS — N186 End stage renal disease: Secondary | ICD-10-CM | POA: Diagnosis not present

## 2022-12-15 DIAGNOSIS — Z992 Dependence on renal dialysis: Secondary | ICD-10-CM | POA: Diagnosis not present

## 2022-12-15 DIAGNOSIS — M86171 Other acute osteomyelitis, right ankle and foot: Secondary | ICD-10-CM | POA: Diagnosis not present

## 2022-12-15 LAB — CBC WITH DIFFERENTIAL/PLATELET
Abs Immature Granulocytes: 0.6 10*3/uL — ABNORMAL HIGH (ref 0.00–0.07)
Basophils Absolute: 0.1 10*3/uL (ref 0.0–0.1)
Basophils Relative: 1 %
Eosinophils Absolute: 0.3 10*3/uL (ref 0.0–0.5)
Eosinophils Relative: 4 %
HCT: 27.6 % — ABNORMAL LOW (ref 39.0–52.0)
Hemoglobin: 8.7 g/dL — ABNORMAL LOW (ref 13.0–17.0)
Lymphocytes Relative: 11 %
Lymphs Abs: 0.9 10*3/uL (ref 0.7–4.0)
MCH: 28.6 pg (ref 26.0–34.0)
MCHC: 31.5 g/dL (ref 30.0–36.0)
MCV: 90.8 fL (ref 80.0–100.0)
Metamyelocytes Relative: 3 %
Monocytes Absolute: 0.2 10*3/uL (ref 0.1–1.0)
Monocytes Relative: 2 %
Myelocytes: 4 %
Neutro Abs: 6.2 10*3/uL (ref 1.7–7.7)
Neutrophils Relative %: 75 %
Platelets: 357 10*3/uL (ref 150–400)
RBC: 3.04 MIL/uL — ABNORMAL LOW (ref 4.22–5.81)
RDW: 18.4 % — ABNORMAL HIGH (ref 11.5–15.5)
WBC: 8.2 10*3/uL (ref 4.0–10.5)
nRBC: 0 % (ref 0.0–0.2)

## 2022-12-15 LAB — SURGICAL PATHOLOGY

## 2022-12-15 LAB — AEROBIC/ANAEROBIC CULTURE W GRAM STAIN (SURGICAL/DEEP WOUND)

## 2022-12-15 MED ORDER — CHLORHEXIDINE GLUCONATE CLOTH 2 % EX PADS
6.0000 | MEDICATED_PAD | Freq: Every day | CUTANEOUS | Status: DC
  Administered 2022-12-16: 6 via TOPICAL

## 2022-12-15 NOTE — Progress Notes (Signed)
Physical Therapy Treatment Patient Details Name: [name] MRN: [id number] DOB: [birth year] Today's Date: 12/15/2022   History of Present Illness 47 y.o. male presented 12/06/22 with worsening of right foot pain, ulcer drainage and fever. Foot cellulitis; MRI rt foot negative for osteomyelitis. S/p I&D RLE on 7/1. 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    PT Comments  Pt continues to have difficulty maintaining R heel WBing only in darco shoe continuing to rock forward and putting some weight on R toes/forefoot. Despite being able to ambulate 480' with RW and completing 3 STE pt with noted active bleeding after session. Dr. Jerral Ralph present and removed heavily saturated dressing. Educated pt on importance of only WBing on R heel. Pt with noted SOB and reports being "winded" during ambulation this date and stair negotiation. HR increased to 150s bpm and required 2 standing rest breaks. Acute Pt to cont to follow. Current D/C recs remains appropriate.     Assistance Recommended at Discharge Intermittent Supervision/Assistance  If plan is discharge home, recommend the following:  Can travel by private vehicle    A little help with walking and/or transfers;A little help with bathing/dressing/bathroom;Assist for transportation;Help with stairs or ramp for entrance      Equipment Recommendations  Rolling walker (2 wheels)    Recommendations for Other Services       Precautions / Restrictions Precautions Precautions: Fall Required Braces or Orthoses: Other Brace Other Brace: darco shoe Restrictions Weight Bearing Restrictions: Yes RLE Weight Bearing: Partial weight bearing RLE Partial Weight Bearing Percentage or Pounds:  (Heel only w/ Darco shoe) Other Position/Activity Restrictions: heel WB in  surgical shoe     Mobility  Bed Mobility Overal bed mobility: Modified Independent             General bed mobility comments: Patient able to perform without physical assistance; increased time and HOB elevated    Transfers Overall transfer level: Needs assistance Equipment used: Rolling walker (2 wheels) Transfers: Sit to/from Stand Sit to Stand: Modified independent (Device/Increase time)           General transfer comment: verbal cues to maintain R heel WBing, pt with difficulty maintaining R heel WBing, frequently rocking forward onto toes despite max verbal cues and demonstration on how to maitnain R heel WBing    Ambulation/Gait Ambulation/Gait assistance: Min guard Gait Distance (Feet): 480 Feet Assistive device: Rolling walker (2 wheels) Gait Pattern/deviations: Step-through pattern, Trunk flexed, Antalgic Gait velocity: dec Gait velocity interpretation: <1.31 ft/sec, indicative of household ambulator   General Gait Details: Cues for Heel weight bearing.  He has a difficult time keeping his weight rocked back on his heel despite max verbal cues to do so. Pt with noted SOB and report "I'm just a little winded." Pt required 2 standing rest breaks, SpO2 > 98% on RA, HR increased to 152bpm at max during ambulation, RN notified   Stairs Stairs: Yes Stairs assistance: Min guard Stair Management: One rail Left, Step to pattern, Forwards Number of Stairs: 3 (to mimic home set up) General stair comments: pt continues to have difficulty maintaining R heel WBing only. Pt able to demonstrate how to safely carry RW up stairs while using L HR to assist with ascending as pt with 3 steps to enter home   Wheelchair Mobility     Tilt Bed    Modified  Rankin (Stroke Patients Only)       Balance Overall balance assessment: Needs assistance Sitting-balance support: No upper extremity supported, Feet supported Sitting balance-Leahy Scale: Good Sitting balance - Comments:  Sitting EOB without UE support   Standing balance support: Bilateral upper extremity supported, During functional activity, Reliant on assistive device for balance Standing balance-Leahy Scale: Fair Standing balance comment: pt continues to rock R foot forward vs maintaining weight on heel                            Cognition Arousal/Alertness: Awake/alert Behavior During Therapy: WFL for tasks assessed/performed Overall Cognitive Status: Within Functional Limits for tasks assessed                                 General Comments: A&Ox4; soft spoken, able to follow all commands        Exercises      General Comments General comments (skin integrity, edema, etc.): pt with dressing and ace wrap on R foot, s/p ambulation pt with noted bleeding most likely due to [facility] during ambulation. Dr. Jerral Ralph aware and removed dressing and replaced with new one, pt assisted to bathroom to have BM      Pertinent Vitals/Pain Pain Assessment Pain Assessment: No/denies pain    Home Living                          Prior Function            PT Goals (current goals can now be found in the care plan section) Acute Rehab PT Goals Patient Stated Goal: Patient would like to return home with less pain PT Goal Formulation: With patient Time For Goal Achievement: 12/23/22 Potential to Achieve Goals: Good Progress towards PT goals: Progressing toward goals    Frequency    Min 1X/week      PT Plan Current plan remains appropriate    Co-evaluation              AM-PAC PT "6 Clicks" Mobility   Outcome Measure  Help needed turning from your back to your side while in a flat bed without using bedrails?: None Help needed moving from lying on your back to sitting on the side of a flat bed without using bedrails?: None Help needed moving to and from a bed to a chair (including a wheelchair)?: A Little Help needed standing up from a chair using your arms  (e.g., wheelchair or bedside chair)?: A Little Help needed to walk in hospital room?: A Little Help needed climbing 3-5 steps with a railing? : A Little 6 Click Score: 20    End of Session Equipment Utilized During Treatment: Gait belt Activity Tolerance: Patient tolerated treatment well Patient left:  (on commode with call light) Nurse Communication: Mobility status (increased HR to 150s and noted SOB with ambulation) PT Visit Diagnosis: Other abnormalities of gait and mobility (R26.89);Difficulty in walking, not elsewhere classified (R26.2);Pain Pain - Right/Left: Right Pain - part of body: Ankle and joints of foot     Time: 0810-0828 PT Time Calculation (min) (ACUTE ONLY): 18 min  Charges:    $Gait Training: 8-22 mins PT General Charges $$ ACUTE PT VISIT: 1 Visit                     Lewis Shock, PT, DPT  Acute Rehabilitation Services Secure chat preferred Office #: [phone]    Iona Hansen 12/15/2022, 8:57 AM

## 2022-12-15 NOTE — Progress Notes (Signed)
Occupational Therapy Treatment Patient Details Name: [name] MRN: [id number] DOB: 1976/03/30 Today's Date: 12/15/2022   History of present illness 47 y.o. male presented 12/06/22 with worsening of right foot pain, ulcer drainage and fever. Foot cellulitis; MRI rt foot negative for osteomyelitis. S/p I&D RLE on 7/1. 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   OT comments  Patient agreeable to participate in OT treatment session this date.  Patient completed supine to sit mod I with HOB elevated and use of bed rails.  Patient completed LB dressing task of donning shoe and darco shoe on with SBA.  Sit to stand and functional mobility tio sink completed with use of RW and min guard but patient requiring verbal cues for proper WB status through darco, patient rocking forward on shoe and placing weight though forefoot, patient educated on correct use, patient reporting " I can't walk like that". UB grooming task of washing face and oral hygiene completed with SBA at sink.  Patient returned to bed and completed sit to supine Independently.  Patient left with all needs within reach   Recommendations for follow up therapy are one component of a multi-disciplinary discharge planning process, led by the attending physician.  Recommendations may be updated based on patient status, additional functional criteria and insurance authorization.    Assistance Recommended at Discharge PRN  Patient can return home with the following  Assist for transportation   Equipment Recommendations  Other (comment)    Recommendations for Other Services      Precautions / Restrictions Precautions Precautions: Fall Required Braces or Orthoses: Other Brace Other Brace: darco shoe Restrictions Weight Bearing Restrictions: Yes Other  Position/Activity Restrictions: heel WB in surgical shoe       Mobility Bed Mobility Overal bed mobility: Modified Independent             General bed mobility comments: Patient able to perform without physical assistance; increased time and HOB elevated    Transfers Overall transfer level: Needs assistance Equipment used: Rolling walker (2 wheels) Transfers: Sit to/from Stand Sit to Stand: Modified independent (Device/Increase time)     Step pivot transfers: Min guard     General transfer comment: verbal cues to maintain R heel WBing, pt with difficulty maintaining R heel WBing, frequently rocking forward onto toes despite max verbal cues and demonstration on how to maitnain R heel WBing     Balance Overall balance assessment: Needs assistance Sitting-balance support: No upper extremity supported, Feet supported Sitting balance-Leahy Scale: Good                                     ADL either performed or assessed with clinical judgement   ADL Overall ADL's : Needs assistance/impaired Eating/Feeding: Independent   Grooming: Wash/dry face;Wash/dry hands;Oral care;Standing   Upper Body Bathing: Modified independent;Sitting           Lower Body Dressing: Set up (seated EOB to don darco and tennis shoe)   Toilet Transfer: Minimal assistance (verbal cues to keep weight off of toe region of foot 2/2 patient walking on front aspect of darco shoe.  Patient educated on placing weight through heel of foot to ensure post op area does not have weight on it but patient  stated he cant walk like  that)   Toileting- Architect and Hygiene: Min guard       Functional mobility during ADLs: Min guard;Rolling walker (2 wheels)      Extremity/Trunk Assessment Upper Extremity Assessment Upper Extremity Assessment: Overall WFL for tasks assessed            Vision   Vision Assessment?: No apparent visual deficits   Perception     Praxis       Cognition Arousal/Alertness: Awake/alert Behavior During Therapy: WFL for tasks assessed/performed Overall Cognitive Status: Within Functional Limits for tasks assessed                                 General Comments: A&Ox4; soft spoken, able to follow all commands        Exercises      Shoulder Instructions       General Comments      Pertinent Vitals/ Pain       Pain Assessment Pain Assessment: 0-10 Pain Score: 2  Pain Location: R foot Pain Descriptors / Indicators: Discomfort, Guarding  Home Living                                          Prior Functioning/Environment              Frequency  Min 2X/week        Progress Toward Goals  OT Goals(current goals can now be found in the care plan section)  Progress towards OT goals: Progressing toward goals  Acute Rehab OT Goals OT Goal Formulation: With patient Time For Goal Achievement: 12/24/22 Potential to Achieve Goals: Good ADL Goals Pt Will Perform Lower Body Dressing: with modified independence;sit to/from stand Pt Will Transfer to Toilet: with modified independence;ambulating;regular height toilet Additional ADL Goal #1: pt will complete sink level bathing mod I from chair position to simulate home setup Additional ADL Goal #2: pt will complete dynamic balance task with RW mod I  Plan Discharge plan remains appropriate    Co-evaluation                 AM-PAC OT "6 Clicks" Daily Activity     Outcome Measure   Help from another person eating meals?: None Help from another person taking care of personal grooming?: None Help from another person toileting, which includes using toliet, bedpan, or urinal?: A Little Help from another person bathing (including washing, rinsing, drying)?: A Little Help from another person to put on and taking off regular upper body clothing?: None Help from another person to put on and taking off regular lower body clothing?: A  Little 6 Click Score: 21    End of Session Equipment Utilized During Treatment: Rolling walker (2 wheels)  OT Visit Diagnosis: Unsteadiness on feet (R26.81);Muscle weakness (generalized) (M62.81)   Activity Tolerance Patient tolerated treatment well   Patient Left in bed;with bed alarm set;with call bell/phone within reach   Nurse Communication Mobility status;Precautions;Weight bearing status        Time: 4098-1191 OT Time Calculation (min): 13 min  Charges: OT Treatments $Self Care/Home Management : 8-22 mins  Governor Specking OT/L  Denice Paradise 12/15/2022, 2:13 PM

## 2022-12-15 NOTE — Progress Notes (Signed)
[facility] Progress Note   Subjective: Seen lying in bed. He appears comfortable, no C/Os.     Objective Vitals:   12/14/22 1938 12/14/22 2245 12/15/22 0439 12/15/22 0746  BP: 94/60 (!) 91/56 103/72 (!) 89/41  Pulse: 74 77 63 66  Resp: 16 18 12 16   Temp: 98.5 F (36.9 C) 98.5 F (36.9 C) 98.5 F (36.9 C) 98.3 F (36.8 C)  TempSrc: Oral Oral Oral Oral  SpO2: 99% 94% 98% 94%  Weight:   (!) 145.8 kg   Height:       Physical Exam General: Chronically ill appearing male in NAD Heart: S1,S2 No M/R/G. SR on monitor Lungs: CTAB A/P Abdomen: Obese, NABS NT Extremities:Trace BLE edema. ACE wrap R foot Dialysis Access: L femoral TDC Drsg intact   Additional Objective Labs: Basic Metabolic Panel: Recent Labs  Lab 12/09/22 0127 12/10/22 0046 12/11/22 0915  NA 130* 129* 131*  K 5.5* 5.1 5.2*  CL 90* 91* 91*  CO2 20* 22 22  GLUCOSE 106* 88 103*  BUN 76* 58* 80*  CREATININE 17.52* 13.67* 15.98*  CALCIUM 9.5 9.5 9.8  PHOS  --   --  7.4*   Liver Function Tests: Recent Labs  Lab 12/11/22 0915  ALBUMIN 2.5*   No results for input(s): "LIPASE", "AMYLASE" in the last 168 hours. CBC: Recent Labs  Lab 12/09/22 0127 12/10/22 0046 12/11/22 0915  WBC 15.0* 13.7* 13.9*  HGB 9.6* 9.6* 9.4*  HCT 30.0* 30.1* 29.3*  MCV 91.7 89.9 92.1  PLT 218 231 295   Blood Culture    Component Value Date/Time   SDES TOE 12/12/2022 0928   SPECREQUEST RT FOOT 12/12/2022 0928   CULT  12/12/2022 0928    RARE METHICILLIN RESISTANT STAPHYLOCOCCUS AUREUS HOLDING FOR POSSIBLE ANAEROBE Performed at [facility], [address] [place], [place] [address]    REPTSTATUS PENDING 12/12/2022 6045    Cardiac Enzymes: No results for input(s): "CKTOTAL", "CKMB", "CKMBINDEX", "TROPONINI" in the last 168 hours. CBG: Recent Labs  Lab 12/13/22 1557 12/13/22 2024 12/14/22 0031 12/14/22 0450 12/14/22 0753  GLUCAP 104* 118* 115* 89 82   Iron Studies: No results for  input(s): "IRON", "TIBC", "TRANSFERRIN", "FERRITIN" in the last 72 hours. @lablastinr3 @ Studies/Results: No results found. Medications:  sodium chloride 0  (12/08/22 1726)    apixaban  5 mg Oral BID   atorvastatin  40 mg Oral Daily   Chlorhexidine Gluconate Cloth  6 each Topical Daily   darbepoetin (ARANESP) injection - DIALYSIS  60 mcg Subcutaneous Q Thu-1800   dextrose  1 ampule Intravenous Once   doxercalciferol  6 mcg Intravenous Q T,Th,Sa-HD   ferric citrate  630 mg Oral TID WC   metroNIDAZOLE  500 mg Oral Q12H   midodrine  15 mg Oral Q8H   oxybutynin  5 mg Oral TID   vancomycin variable dose per unstable renal function (pharmacist dosing)   Does not apply See admin instructions     Assessment/ Plan: Pt is a 47 y.o. yo male with a past medical history significant for ESRD on HD, chronic hypotension, A-fib, CHF admitted with sepsis and a right foot infection.   OP HD: TTS HD  4.5h   500/ 500  140kg   2/2 bath  TDC  Hep 5000 units IV+ 2500  units IV midrun - hectorol 6 mcg IV three times per week - parsabiv 7.5 mg IV three times per week - no esa - DWELL TDC WITH CATH FLO  POST HD three times per week  # Sepsis due to right foot infection/possible abscess: On Vanc + metronidazole. S/p R foot I&D 7/1 -> Cx growing MRSA. S/p 1st ray amputation 7/5. Needs 4 weeks of vancomycin per ID.    # ESRD TTS: Next HD 12/16/2022   # Chronic hypotension: SBP runs 70-80 range per patient, asymptomatic. Currently on midodrine 15mg  TID.   # Anemia of CKD: Hgb 9.4 - Aranesp q Thurs (last 7/4).    # CKD-MBD: Continue Auryxia, Hectorol. Parsabiv not on formulary here.  Monitor labs.   # Mild hyperkalemia: Improved with HD, monitor.   # A-Fib: On Eliquis  Talayeh Bruinsma H. Ranny Wiebelhaus NP-C 12/15/2022, 11:09 AM  [employer] [phone]

## 2022-12-15 NOTE — Progress Notes (Signed)
PROGRESS NOTE    [name]  ZOX:[id number] DOB: 1976-03-17 DOA: 12/06/2022 PCP: [name], MD    Brief Narrative:  47 year old ESRD on hemodialysis TTS with right femoral TDC, chronic Eliquis therapy for paroxysmal A-fib and clotted fistulas, sleep apnea not using CPAP presented to the hospital with 2 months of right foot ulcer not healing.  In the emergency room initially with low blood pressures, admitted to ICU, subsequently started on midodrine and transferred out of ICU.  Patient remains in the hospital for surgical intervention and IV antibiotics. He does have chronically low blood pressures and mostly asymptomatic.  Assessment & Plan:   Severe sepsis from right foot ulcer/abscess and osteomyelitis: Incision and drainage 7/1, surgical cultures with MRSA Persistent infection, status post right great toe ray amputation 7/5.   Blood cultures with Staph epidermidis. Surgical cultures with MRSA. Seen by ID.  Recommended vancomycin for 4 weeks with hemodialysis, metronidazole 500 mg twice daily for 4 weeks. Surgical shoe.  Weightbearing with heel. Continue to mobilize. Patient with significant bleeding from the incision bed today, will monitor today before discharge.  Check hemoglobin levels.  Chronic hypotension: Asymptomatic.  On midodrine 15 mg 3 times daily.  No contraindication to work with PT OT.  ESRD on hemodialysis, hyperkalemia: Improved.  Getting dialysis on his schedule.  Chronic diastolic heart failure: Euvolemic.  OSA not on CPAP: Refusing CPAP at night.  PAF on Eliquis: Normal sinus rhythm.  Not on any rate control medications.  On Eliquis.  Will monitor for bleeding.  Hypoglycemia: Improved.   DVT prophylaxis: SCDs Start: 12/06/22 1433 apixaban (ELIQUIS) tablet 5 mg   Code Status: Full code.  Family Communication: None at the bedside Disposition Plan: Status is: Inpatient Remains inpatient appropriate because: Active surgical interventions.   Postoperative status.     Consultants:  Podiatry Nephrology Infectious disease.  Procedures:  I&D of the right foot  Antimicrobials:  Vancomycin  7/1--- Flagyl 7/4---   Subjective:  Patient seen and examined.  Mild pain.  Denies any other complaints.  He walked with physical therapy and at the end of the session he had heavily saturated dressing with a blood. Dressing removed, Ace wrap and underlying dressing heavily saturated, mild oozing from the inferior aspect of the incision but no active bleeding.  Reinforced with new dressing.  Will monitor.   Objective: Vitals:   12/14/22 1938 12/14/22 2245 12/15/22 0439 12/15/22 0746  BP: 94/60 (!) 91/56 103/72 (!) 89/41  Pulse: 74 77 63 66  Resp: 16 18 12 16   Temp: 98.5 F (36.9 C) 98.5 F (36.9 C) 98.5 F (36.9 C) 98.3 F (36.8 C)  TempSrc: Oral Oral Oral Oral  SpO2: 99% 94% 98% 94%  Weight:   (!) 145.8 kg   Height:        Intake/Output Summary (Last 24 hours) at 12/15/2022 1120 Last data filed at 12/14/2022 1545 Gross per 24 hour  Intake 390 ml  Output --  Net 390 ml    Filed Weights   12/13/22 1229 12/14/22 0448 12/15/22 0439  Weight: (!) 141.1 kg (!) 143.9 kg (!) 145.8 kg    Examination:  General: Fairly comfortable.  On room air. Cardiovascular: S1-S2 normal.  Regular rate rate rhythm. Respiratory: Bilateral clear.  No added sounds. Gastrointestinal: Soft.  Nontender. Ext: Right femoral AV graft, hemodialysis catheter left groin. Neuro: Alert awake and oriented. Musculoskeletal: No deformities. Right foot postop dressing, heavily saturated with bleeding, removed and new dressing applied.  Data Reviewed: I have personally reviewed following labs and imaging studies  CBC: Recent Labs  Lab 12/08/22 1633 12/09/22 0127 12/10/22 0046 12/11/22 0915  WBC  --  15.0* 13.7* 13.9*  HGB 12.6* 9.6* 9.6* 9.4*  HCT 37.0* 30.0* 30.1* 29.3*  MCV  --  91.7 89.9 92.1  PLT  --  218 231 295    Basic Metabolic  Panel: Recent Labs  Lab 12/08/22 1633 12/08/22 1744 12/09/22 0127 12/10/22 0046 12/11/22 0915  NA 127* 130* 130* 129* 131*  K 6.1* 5.4* 5.5* 5.1 5.2*  CL 97* 91* 90* 91* 91*  CO2  --  22 20* 22 22  GLUCOSE 101* 108* 106* 88 103*  BUN 64* 69* 76* 58* 80*  CREATININE 16.90* 16.85* 17.52* 13.67* 15.98*  CALCIUM  --  9.1 9.5 9.5 9.8  MG  --   --  2.1 2.1  --   PHOS  --   --   --   --  7.4*    GFR: Estimated Creatinine Clearance: 8.6 mL/min (A) (by C-G formula based on SCr of 15.98 mg/dL (H)). Liver Function Tests: Recent Labs  Lab 12/11/22 0915  ALBUMIN 2.5*    No results for input(s): "LIPASE", "AMYLASE" in the last 168 hours. No results for input(s): "AMMONIA" in the last 168 hours. Coagulation Profile: No results for input(s): "INR", "PROTIME" in the last 168 hours. Cardiac Enzymes: No results for input(s): "CKTOTAL", "CKMB", "CKMBINDEX", "TROPONINI" in the last 168 hours. BNP (last 3 results) No results for input(s): "PROBNP" in the last 8760 hours. HbA1C: No results for input(s): "HGBA1C" in the last 72 hours. CBG: Recent Labs  Lab 12/13/22 1557 12/13/22 2024 12/14/22 0031 12/14/22 0450 12/14/22 0753  GLUCAP 104* 118* 115* 89 82    Lipid Profile: No results for input(s): "CHOL", "HDL", "LDLCALC", "TRIG", "CHOLHDL", "LDLDIRECT" in the last 72 hours. Thyroid Function Tests: No results for input(s): "TSH", "T4TOTAL", "FREET4", "T3FREE", "THYROIDAB" in the last 72 hours. Anemia Panel: No results for input(s): "VITAMINB12", "FOLATE", "FERRITIN", "TIBC", "IRON", "RETICCTPCT" in the last 72 hours. Sepsis Labs: No results for input(s): "PROCALCITON", "LATICACIDVEN" in the last 168 hours.   Recent Results (from the past 240 hour(s))  Blood culture (routine x 2)     Status: None   Collection Time: 12/06/22 12:20 PM   Specimen: BLOOD LEFT HAND  Result Value Ref Range Status   Specimen Description BLOOD LEFT HAND  Final   Special Requests   Final    BOTTLES  DRAWN AEROBIC ONLY Blood Culture results may not be optimal due to an inadequate volume of blood received in culture bottles   Culture   Final    NO GROWTH 5 DAYS Performed at [facility], [address] [place] [address], [place], [place] [address]    Report Status 12/11/2022 FINAL  Final  Blood culture (routine x 2)     Status: Abnormal   Collection Time: 12/06/22 12:45 PM   Specimen: BLOOD  Result Value Ref Range Status   Specimen Description BLOOD SITE NOT SPECIFIED  Final   Special Requests   Final    BOTTLES DRAWN AEROBIC AND ANAEROBIC Blood Culture results may not be optimal due to an inadequate volume of blood received in culture bottles   Culture  Setup Time   Final    GRAM POSITIVE COCCI AEROBIC BOTTLE ONLY CRITICAL RESULT CALLED TO, READ BACK BY AND VERIFIED WITH: PHARMD LAUREN BELL [id number] AT 1730 BY EC    Culture (A)  Final  STAPHYLOCOCCUS EPIDERMIDIS THE SIGNIFICANCE OF ISOLATING THIS ORGANISM FROM A SINGLE VENIPUNCTURE CANNOT BE PREDICTED WITHOUT FURTHER CLINICAL AND CULTURE CORRELATION. SUSCEPTIBILITIES AVAILABLE ONLY ON REQUEST. Performed at [facility], [address], [place], [place] [address]    Report Status 12/08/2022 FINAL  Final  Blood Culture ID Panel (Reflexed)     Status: Abnormal   Collection Time: 12/06/22 12:45 PM  Result Value Ref Range Status   Enterococcus faecalis NOT DETECTED NOT DETECTED Final   Enterococcus Faecium NOT DETECTED NOT DETECTED Final   Listeria monocytogenes NOT DETECTED NOT DETECTED Final   Staphylococcus species DETECTED (A) NOT DETECTED Final    Comment: CRITICAL RESULT CALLED TO, READ BACK BY AND VERIFIED WITH: PHARMD LAUREN BELL [id number] AT 1730 BY EC    Staphylococcus aureus (BCID) NOT DETECTED NOT DETECTED Final   Staphylococcus epidermidis DETECTED (A) NOT DETECTED Final    Comment: Methicillin (oxacillin) resistant coagulase negative staphylococcus. Possible blood culture contaminant (unless isolated from more than one  blood culture draw or clinical case suggests pathogenicity). No antibiotic treatment is indicated for blood  culture contaminants. REPEAT WITH NEW VIAL OF CTRL PHARMD LAUREN BELL [id number] AT 1730 BY EC    Staphylococcus lugdunensis NOT DETECTED NOT DETECTED Final   Streptococcus species NOT DETECTED NOT DETECTED Final   Streptococcus agalactiae NOT DETECTED NOT DETECTED Final   Streptococcus pneumoniae NOT DETECTED NOT DETECTED Final   Streptococcus pyogenes NOT DETECTED NOT DETECTED Final   A.calcoaceticus-baumannii NOT DETECTED NOT DETECTED Final   Bacteroides fragilis NOT DETECTED NOT DETECTED Final   Enterobacterales NOT DETECTED NOT DETECTED Final   Enterobacter cloacae complex NOT DETECTED NOT DETECTED Final   Escherichia coli NOT DETECTED NOT DETECTED Final   Klebsiella aerogenes NOT DETECTED NOT DETECTED Final   Klebsiella oxytoca NOT DETECTED NOT DETECTED Final   Klebsiella pneumoniae NOT DETECTED NOT DETECTED Final   Proteus species NOT DETECTED NOT DETECTED Final   Salmonella species NOT DETECTED NOT DETECTED Final   Serratia marcescens NOT DETECTED NOT DETECTED Final   Haemophilus influenzae NOT DETECTED NOT DETECTED Final   Neisseria meningitidis NOT DETECTED NOT DETECTED Final   Pseudomonas aeruginosa NOT DETECTED NOT DETECTED Final   Stenotrophomonas maltophilia NOT DETECTED NOT DETECTED Final   Candida albicans NOT DETECTED NOT DETECTED Final   Candida auris NOT DETECTED NOT DETECTED Final   Candida glabrata NOT DETECTED NOT DETECTED Final   Candida krusei NOT DETECTED NOT DETECTED Final   Candida parapsilosis NOT DETECTED NOT DETECTED Final   Candida tropicalis NOT DETECTED NOT DETECTED Final   Cryptococcus neoformans/gattii NOT DETECTED NOT DETECTED Final   Methicillin resistance mecA/C DETECTED (A) NOT DETECTED Final    Comment: CRITICAL RESULT CALLED TO, READ BACK BY AND VERIFIED WITH: Chestine Spore [id number] AT 1730 BY EC Performed at [facility]  [facility], [address], [place], [place] [address]   MRSA Next Gen by PCR, Nasal     Status: Abnormal   Collection Time: 12/06/22  4:33 PM   Specimen: Nasal Mucosa; Nasal Swab  Result Value Ref Range Status   MRSA by PCR Next Gen DETECTED (A) NOT DETECTED Final    Comment: RESULT CALLED TO, READ BACK BY AND VERIFIED WITH: 12/06/2022 AT 1836 TO RN Donney Rankins., ADC (NOTE) The GeneXpert MRSA Assay (FDA approved for NASAL specimens only), is one component of a comprehensive MRSA colonization surveillance program. It is not intended to diagnose MRSA infection nor to guide or monitor treatment for MRSA infections. Test  performance is not FDA approved in patients less than 22 years old. Performed at [facility], [address], [place], [place] [address]   Aerobic/Anaerobic Culture w Gram Stain (surgical/deep wound)     Status: None   Collection Time: 12/08/22  5:12 PM   Specimen: Path fluid; Tissue  Result Value Ref Range Status   Specimen Description WOUND  Final   Special Requests RIGHT FOOT  Final   Gram Stain   Final    RARE WBC PRESENT, PREDOMINANTLY PMN NO ORGANISMS SEEN    Culture   Final    MODERATE METHICILLIN RESISTANT STAPHYLOCOCCUS AUREUS MODERATE BACTEROIDES FRAGILIS BETA LACTAMASE POSITIVE Performed at [facility], [address] [place], [place] [address]    Report Status 12/11/2022 FINAL  Final   Organism ID, Bacteria METHICILLIN RESISTANT STAPHYLOCOCCUS AUREUS  Final      Susceptibility   Methicillin resistant staphylococcus aureus - MIC*    CIPROFLOXACIN <=0.5 SENSITIVE Sensitive     ERYTHROMYCIN >=8 RESISTANT Resistant     GENTAMICIN <=0.5 SENSITIVE Sensitive     OXACILLIN >=4 RESISTANT Resistant     TETRACYCLINE <=1 SENSITIVE Sensitive     VANCOMYCIN 1 SENSITIVE Sensitive     TRIMETH/SULFA <=10 SENSITIVE Sensitive     CLINDAMYCIN <=0.25 SENSITIVE Sensitive     RIFAMPIN <=0.5 SENSITIVE Sensitive     Inducible Clindamycin NEGATIVE Sensitive      LINEZOLID 2 SENSITIVE Sensitive     * MODERATE METHICILLIN RESISTANT STAPHYLOCOCCUS AUREUS  Aerobic/Anaerobic Culture w Gram Stain (surgical/deep wound)     Status: None   Collection Time: 12/08/22  5:21 PM   Specimen: Path fluid; Tissue  Result Value Ref Range Status   Specimen Description WOUND  Final   Special Requests FIRST METATARSAL  Final   Gram Stain NO WBC SEEN NO ORGANISMS SEEN   Final   Culture   Final    FEW METHICILLIN RESISTANT STAPHYLOCOCCUS AUREUS FEW BACTEROIDES FRAGILIS BETA LACTAMASE POSITIVE Performed at [facility], [address], [place], [place] [address]    Report Status 12/11/2022 FINAL  Final   Organism ID, Bacteria METHICILLIN RESISTANT STAPHYLOCOCCUS AUREUS  Final      Susceptibility   Methicillin resistant staphylococcus aureus - MIC*    CIPROFLOXACIN <=0.5 SENSITIVE Sensitive     ERYTHROMYCIN >=8 RESISTANT Resistant     GENTAMICIN <=0.5 SENSITIVE Sensitive     OXACILLIN >=4 RESISTANT Resistant     TETRACYCLINE <=1 SENSITIVE Sensitive     VANCOMYCIN 1 SENSITIVE Sensitive     TRIMETH/SULFA <=10 SENSITIVE Sensitive     CLINDAMYCIN <=0.25 SENSITIVE Sensitive     RIFAMPIN <=0.5 SENSITIVE Sensitive     Inducible Clindamycin NEGATIVE Sensitive     LINEZOLID 2 SENSITIVE Sensitive     * FEW METHICILLIN RESISTANT STAPHYLOCOCCUS AUREUS  Aerobic/Anaerobic Culture w Gram Stain (surgical/deep wound)     Status: None (Preliminary result)   Collection Time: 12/12/22  9:28 AM   Specimen: Foot, Right; Amputation  Result Value Ref Range Status   Specimen Description TOE  Final   Special Requests RT FOOT  Final   Gram Stain NO WBC SEEN NO ORGANISMS SEEN   Final   Culture   Final    RARE METHICILLIN RESISTANT STAPHYLOCOCCUS AUREUS HOLDING FOR POSSIBLE ANAEROBE Performed at [facility], [address], [place], [place] [address]    Report Status PENDING  Incomplete   Organism ID, Bacteria METHICILLIN RESISTANT STAPHYLOCOCCUS AUREUS  Final  Susceptibility   Methicillin resistant staphylococcus aureus - MIC*    CIPROFLOXACIN <=0.5 SENSITIVE Sensitive     ERYTHROMYCIN >=8 RESISTANT Resistant     GENTAMICIN <=0.5 SENSITIVE Sensitive     OXACILLIN >=4 RESISTANT Resistant     TETRACYCLINE <=1 SENSITIVE Sensitive     VANCOMYCIN 1 SENSITIVE Sensitive     TRIMETH/SULFA <=10 SENSITIVE Sensitive     CLINDAMYCIN <=0.25 SENSITIVE Sensitive     RIFAMPIN <=0.5 SENSITIVE Sensitive     Inducible Clindamycin NEGATIVE Sensitive     LINEZOLID 2 SENSITIVE Sensitive     * RARE METHICILLIN RESISTANT STAPHYLOCOCCUS AUREUS         Radiology Studies: No results found.      Scheduled Meds:  apixaban  5 mg Oral BID   atorvastatin  40 mg Oral Daily   Chlorhexidine Gluconate Cloth  6 each Topical Daily   [START ON 12/16/2022] Chlorhexidine Gluconate Cloth  6 each Topical Q0600   darbepoetin (ARANESP) injection - DIALYSIS  60 mcg Subcutaneous Q Thu-1800   dextrose  1 ampule Intravenous Once   doxercalciferol  6 mcg Intravenous Q T,Th,Sa-HD   ferric citrate  630 mg Oral TID WC   metroNIDAZOLE  500 mg Oral Q12H   midodrine  15 mg Oral Q8H   oxybutynin  5 mg Oral TID   vancomycin variable dose per unstable renal function (pharmacist dosing)   Does not apply See admin instructions   Continuous Infusions:  sodium chloride 0  (12/08/22 1726)     LOS: 9 days    Time spent: 35 minutes    [name], MD [facility]

## 2022-12-15 NOTE — Care Management Important Message (Signed)
Important Message  Patient Details  Name: [name] MRN: [id number] Date of Birth: 01-Sep-1975   Medicare Important Message Given:  Yes     [name] 12/15/2022, 11:34 AM

## 2022-12-16 ENCOUNTER — Other Ambulatory Visit (HOSPITAL_COMMUNITY): Payer: Self-pay

## 2022-12-16 DIAGNOSIS — Z992 Dependence on renal dialysis: Secondary | ICD-10-CM | POA: Diagnosis not present

## 2022-12-16 DIAGNOSIS — L02611 Cutaneous abscess of right foot: Secondary | ICD-10-CM | POA: Diagnosis not present

## 2022-12-16 DIAGNOSIS — N186 End stage renal disease: Secondary | ICD-10-CM | POA: Diagnosis not present

## 2022-12-16 DIAGNOSIS — L03031 Cellulitis of right toe: Secondary | ICD-10-CM | POA: Diagnosis not present

## 2022-12-16 LAB — RENAL FUNCTION PANEL
Albumin: 2.8 g/dL — ABNORMAL LOW (ref 3.5–5.0)
Anion gap: 18 — ABNORMAL HIGH (ref 5–15)
BUN: 90 mg/dL — ABNORMAL HIGH (ref 6–20)
CO2: 20 mmol/L — ABNORMAL LOW (ref 22–32)
Calcium: 10.2 mg/dL (ref 8.9–10.3)
Chloride: 91 mmol/L — ABNORMAL LOW (ref 98–111)
Creatinine, Ser: 16.75 mg/dL — ABNORMAL HIGH (ref 0.61–1.24)
GFR, Estimated: 3 mL/min — ABNORMAL LOW (ref >60)
Glucose, Bld: 83 mg/dL (ref 70–99)
Phosphorus: 11.5 mg/dL — ABNORMAL HIGH (ref 2.5–4.6)
Potassium: 5.9 mmol/L — ABNORMAL HIGH (ref 3.5–5.1)
Sodium: 129 mmol/L — ABNORMAL LOW (ref 135–145)

## 2022-12-16 LAB — VANCOMYCIN, RANDOM: Vancomycin Rm: 23 ug/mL

## 2022-12-16 LAB — CBC
HCT: 28.3 % — ABNORMAL LOW (ref 39.0–52.0)
Hemoglobin: 8.8 g/dL — ABNORMAL LOW (ref 13.0–17.0)
MCH: 28.3 pg (ref 26.0–34.0)
MCHC: 31.1 g/dL (ref 30.0–36.0)
MCV: 91 fL (ref 80.0–100.0)
Platelets: 381 10*3/uL (ref 150–400)
RBC: 3.11 MIL/uL — ABNORMAL LOW (ref 4.22–5.81)
RDW: 18.3 % — ABNORMAL HIGH (ref 11.5–15.5)
WBC: 8 10*3/uL (ref 4.0–10.5)
nRBC: 0 % (ref 0.0–0.2)

## 2022-12-16 MED ORDER — MIDODRINE HCL 5 MG PO TABS
15.0000 mg | ORAL_TABLET | Freq: Three times a day (TID) | ORAL | 0 refills | Status: AC
  Filled 2022-12-16: qty 260, 29d supply, fill #0

## 2022-12-16 MED ORDER — OXYCODONE HCL 5 MG PO TABS
5.0000 mg | ORAL_TABLET | Freq: Four times a day (QID) | ORAL | 0 refills | Status: AC | PRN
  Filled 2022-12-16: qty 20, 5d supply, fill #0

## 2022-12-16 MED ORDER — VANCOMYCIN IV (FOR PTA / DISCHARGE USE ONLY): INTRAVENOUS | 0 refills | Status: DC

## 2022-12-16 MED ORDER — OXYBUTYNIN CHLORIDE 5 MG PO TABS
5.0000 mg | ORAL_TABLET | Freq: Three times a day (TID) | ORAL | 0 refills | Status: AC
  Filled 2022-12-16: qty 90, 30d supply, fill #0

## 2022-12-16 MED ORDER — METRONIDAZOLE 500 MG PO TABS
500.0000 mg | ORAL_TABLET | Freq: Two times a day (BID) | ORAL | 0 refills | Status: AC
  Filled 2022-12-16: qty 48, 24d supply, fill #0

## 2022-12-16 MED ORDER — HEPARIN SODIUM (PORCINE) 1000 UNIT/ML DIALYSIS
1000.0000 [IU] | INTRAMUSCULAR | Status: DC | PRN
  Filled 2022-12-16: qty 1

## 2022-12-16 MED ORDER — HEPARIN SODIUM (PORCINE) 1000 UNIT/ML DIALYSIS
7000.0000 [IU] | Freq: Once | INTRAMUSCULAR | Status: AC
  Administered 2022-12-16: 7000 [IU] via INTRAVENOUS_CENTRAL
  Filled 2022-12-16 (×2): qty 7

## 2022-12-16 MED ORDER — HEPARIN SODIUM (PORCINE) 1000 UNIT/ML DIALYSIS
2500.0000 [IU] | Freq: Once | INTRAMUSCULAR | Status: AC
  Administered 2022-12-16: 2500 [IU] via INTRAVENOUS_CENTRAL
  Filled 2022-12-16: qty 3

## 2022-12-16 MED ORDER — ALTEPLASE 2 MG IJ SOLR
INTRAMUSCULAR | Status: AC
  Administered 2022-12-16: 2 mg
  Filled 2022-12-16: qty 2

## 2022-12-16 MED ORDER — ALTEPLASE 2 MG IJ SOLR
2.0000 mg | Freq: Once | INTRAMUSCULAR | Status: AC | PRN
  Filled 2022-12-16: qty 2

## 2022-12-16 MED ORDER — VANCOMYCIN HCL IN DEXTROSE 1-5 GM/200ML-% IV SOLN
1000.0000 mg | Freq: Once | INTRAVENOUS | Status: AC
  Administered 2022-12-16: 1000 mg via INTRAVENOUS
  Filled 2022-12-16: qty 200

## 2022-12-16 MED ORDER — VANCOMYCIN HCL 1250 MG/250ML IV SOLN
1250.0000 mg | INTRAVENOUS | Status: DC
  Filled 2022-12-16: qty 250

## 2022-12-16 MED ORDER — VANCOMYCIN HCL 1250 MG/250ML IV SOLN: 1250.0000 mg | INTRAVENOUS | Status: AC

## 2022-12-16 MED ORDER — ANTICOAGULANT SODIUM CITRATE 4% (200MG/5ML) IV SOLN: 5.0000 mL | Status: DC | PRN

## 2022-12-16 NOTE — Progress Notes (Signed)
D/C order noted. Contacted [facility] to advise clinic of pt's d/c today and that pt should resume care on Thursday. Renal NP to send orders to clinic for iv abx with HD treatments at d/c.   Olivia Canter Renal Navigator [phone]

## 2022-12-16 NOTE — TOC Transition Note (Signed)
Transition of Care (TOC) - CM/SW Discharge Note Donn Pierini RN, BSN Transitions of Care Unit 4E- RN Case Manager See Treatment Team for direct phone #   Patient Details  Name: [name] MRN: [id number] Date of Birth: Jul 07, 1975  Transition of Care [facility] CM/SW Contact:  Darrold Span, RN Phone Number: 12/16/2022, 2:42 PM   Clinical Narrative:    Pt stable for transition home today, noted recommendations per PT/OT for [facility] services.  CM spoke with pt at bedside to discuss [facility] and DME needs.  Per pt he has a RW that he got in 2022- he is not sure where it is at- thinks maybe in a closet at home- explained insurance would not cover another RW at this time (one every 5 yrs)- pt voiced that he will look for RW when he gets home or see if he can borrow one.  Offered HH services per recommendations- pt agreeable- list provided for choice Per CMS guidelines from [employer]pl website with star ratings (copy placed in shadow chart)- per pt he does not have a preference- explained that CM can reach out to providers to find one that is in-network with his insurance and has available staffing- pt agreeable.   Address, phone # and PCP all confirmed, pt voiced he will call someone for transportation home.   Call made to [place]- liaison for [employer]- referral has been accepted for HHPT/OT- will anticipated start of care within 48 hr of discharge.   No further TOC needs noted.    Final next level of care: Home w Home Health Services Barriers to Discharge: Barriers Resolved   Patient Goals and CMS Choice CMS Medicare.gov Compare Post Acute Care list provided to:: Patient Choice offered to / list presented to : Patient  Discharge Placement                 Home w/ [facility]        Discharge Plan and Services Additional resources added to the After Visit Summary for   In-house Referral: NA Discharge Planning Services: CM Consult Post Acute Care Choice: Durable Medical  Equipment, Home Health          DME Arranged: N/A DME Agency: NA       HH Arranged: PT, OT HH Agency: [facility] Date HH Agency Contacted: 12/16/22 Time HH Agency Contacted: 1442 Representative spoke with at [facility] Agency: [place]  Social Determinants of Health (SDOH) Interventions SDOH Screenings   Food Insecurity: No Food Insecurity (09/15/2022)  Housing: Low Risk  (12/03/2022)  Transportation Needs: No Transportation Needs (12/03/2022)  Alcohol Screen: Low Risk  (08/14/2021)  Depression (PHQ2-9): Low Risk  (09/15/2022)  Financial Resource Strain: Low Risk  (08/14/2021)  Physical Activity: Inactive (08/14/2021)  Social Connections: Socially Isolated (08/14/2021)  Stress: No Stress Concern Present (08/14/2021)  Tobacco Use: Medium Risk (12/13/2022)     Readmission Risk Interventions    12/16/2022    2:42 PM  Readmission Risk Prevention Plan  Transportation Screening Complete  PCP or Specialist Appt within 3-5 Days Complete  HRI or Home Care Consult Complete  Social Work Consult for Recovery Care Planning/Counseling Complete  Palliative Care Screening Not Applicable  Medication Review Oceanographer) Complete

## 2022-12-16 NOTE — Progress Notes (Addendum)
Pharmacy Antibiotic Note  [name] is a 47 y.o. male admitted on 12/06/2022 with RLE abscess and DFI s/p I&D and first ray amputation. Pt has ESRD and is on HD TTS.  Pharmacy has been consulted for Vancomycin dosing.   Pre-HD vancomycin level resulted at 23. Scheduled for 4.5h dialysis session @ BFR 335mL/min. Estimated postHD level is ~13.8 which is subtherapeutic (target level 15- 25 mcg/mL).  Plan: Give vancomycin 1000mg  IV x1 today Plan vancomycin 1250mg  IV qHD session (TTS) starting 7/11 Would check vancomycin random prior to session on 7/13 Continue metronidazole 500mg  PO q12 hours  ID following - planning 4 weeks of antibiotics  Planning for discharge today per primary team   Height: 6\' 2"  (188 cm) Weight: (!) 143.1 kg (315 lb 7.7 oz) IBW/kg (Calculated) : 82.2  Temp (24hrs), Avg:98.2 F (36.8 C), Min:98 F (36.7 C), Max:98.4 F (36.9 C)  Recent Labs  Lab 12/10/22 0046 12/11/22 0915 12/13/22 0806 12/15/22 1542 12/16/22 0730 12/16/22 0851  WBC 13.7* 13.9*  --  8.2 8.0  --   CREATININE 13.67* 15.98*  --   --  16.75*  --   VANCORANDOM  --   --  38  --   --  23     Estimated Creatinine Clearance: 8.1 mL/min (A) (by C-G formula based on SCr of 16.75 mg/dL (H)).    No Known Allergies  Antimicrobials this admission: Cefepime 6/29 >> 7/2 Vancomycin 6/29 >>  Flagyl 7/4 >>   Microbiology results: 6/29 BCx: 1/2 staph epi- likely contaminant  7/1 OR wound cx + MRSA, B.fragilis (beta lactamase +) 7/5 OR wound cx + MRSA, B.fragilis (beta lactamase +)  Rexford Maus, PharmD, BCPS 12/16/2022 10:29 AM

## 2022-12-16 NOTE — Progress Notes (Signed)
POST HD TX NOTE  12/16/22 1321  Vitals  BP (!) 74/49  MAP (mmHg) (!) 56  BP Location Left Wrist  BP Method Automatic  Patient Position (if appropriate) Lying  Pulse Rate 91  Pulse Rate Source Monitor  ECG Heart Rate 90  Resp 12  Oxygen Therapy  SpO2 97 %  O2 Device Room Air  Pulse Oximetry Type Continuous  During Treatment Monitoring  Intra-Hemodialysis Comments (S)   (post HD tx VS check)  Post Treatment  Dialyzer Clearance Lightly streaked  Duration of HD Treatment -hour(s) 4.5 hour(s)  Hemodialysis Intake (mL) 200 mL (vancomycin)  Liters Processed 108  Fluid Removed (mL) 4000 mL ( (value from machine) - (vancomycin) = )  Tolerated HD Treatment Yes  Post-Hemodialysis Comments (S)  tx completed w/ low bp throughout tx, but that is pt's usual. MD and NP both were at bedside and agreed. received bp parameters from MD at the bedside to keep sbp > high 60's. pt was never symptomatic of they hypotension. UF goal met, blood rinsed back. VSS although still w/ soft bp. Medication Admin: Heparin 7000 units pre tx bolus, Heparin 2500 units mid tx bolus, hectorol IVP, Vancomycin 1000mg  IVPB, TPA dwells 4.44ml  Hemodialysis Catheter Left Femoral vein Double lumen Permanent (Tunneled)  Placement Date/Time: 12/06/22 (c) 1834   Placed prior to admission: Yes  Orientation: Left  Access Location: Femoral vein  Hemodialysis Catheter Type: Double lumen Permanent (Tunneled)  Site Condition No complications  Blue Lumen Status Other (Comment);Dead end cap in place (TPA dwells)  Red Lumen Status Other (Comment);Dead end cap in place (TPA dwells)  Purple Lumen Status N/A  Catheter fill solution Other (Comment) (TPA)  Catheter fill volume (Arterial) 2.3 cc  Catheter fill volume (Venous) 2.3  Dressing Type Transparent  Dressing Status Antimicrobial disc in place;Clean, Dry, Intact  Drainage Description None  Dressing Change Due 12/20/22  Post treatment catheter status Capped  and Clamped

## 2022-12-16 NOTE — Progress Notes (Signed)
[facility] Progress Note   Subjective: Seen on HD. SBP 70s HR too low for extra midodrine. He is asymptomatic. He is going home today. Will complet  Objective Vitals:   12/16/22 0825 12/16/22 0826 12/16/22 0830 12/16/22 0900  BP: (!) 74/49 (!) 76/48 (!) 74/49 (!) 72/54  Pulse: 69 63 66 68  Resp: (!) 21 13 10 13   Temp:      TempSrc:      SpO2: 94% 95% 95% 94%  Weight:      Height:       Physical Exam General: Chronically ill appearing male in NAD Heart: S1,S2 No M/R/G. SR on monitor Lungs: CTAB A/P Abdomen: Obese, NABS NT Extremities:Trace BLE edema. ACE wrap R foot Dialysis Access: L femoral TDC Drsg intact  Additional Objective Labs: Basic Metabolic Panel: Recent Labs  Lab 12/10/22 0046 12/11/22 0915 12/16/22 0730  NA 129* 131* 129*  K 5.1 5.2* 5.9*  CL 91* 91* 91*  CO2 22 22 20*  GLUCOSE 88 103* 83  BUN 58* 80* 90*  CREATININE 13.67* 15.98* 16.75*  CALCIUM 9.5 9.8 10.2  PHOS  --  7.4* 11.5*   Liver Function Tests: Recent Labs  Lab 12/11/22 0915 12/16/22 0730  ALBUMIN 2.5* 2.8*   No results for input(s): "LIPASE", "AMYLASE" in the last 168 hours. CBC: Recent Labs  Lab 12/10/22 0046 12/11/22 0915 12/15/22 1542 12/16/22 0730  WBC 13.7* 13.9* 8.2 8.0  NEUTROABS  --   --  6.2  --   HGB 9.6* 9.4* 8.7* 8.8*  HCT 30.1* 29.3* 27.6* 28.3*  MCV 89.9 92.1 90.8 91.0  PLT 231 295 357 381   Blood Culture    Component Value Date/Time   SDES TOE 12/12/2022 0928   SPECREQUEST RT FOOT 12/12/2022 0928   CULT  12/12/2022 0928    RARE METHICILLIN RESISTANT STAPHYLOCOCCUS AUREUS FEW BACTEROIDES FRAGILIS BETA LACTAMASE POSITIVE Performed at [facility], [address], [place], [place] [address]    REPTSTATUS 12/15/2022 FINAL 12/12/2022 2130    Cardiac Enzymes: No results for input(s): "CKTOTAL", "CKMB", "CKMBINDEX", "TROPONINI" in the last 168 hours. CBG: Recent Labs  Lab 12/13/22 1557 12/13/22 2024 12/14/22 0031 12/14/22 0450  12/14/22 0753  GLUCAP 104* 118* 115* 89 82   Iron Studies: No results for input(s): "IRON", "TIBC", "TRANSFERRIN", "FERRITIN" in the last 72 hours. @lablastinr3 @ Studies/Results: No results found. Medications:  sodium chloride 0  (12/08/22 1726)   anticoagulant sodium citrate      apixaban  5 mg Oral BID   atorvastatin  40 mg Oral Daily   Chlorhexidine Gluconate Cloth  6 each Topical Daily   Chlorhexidine Gluconate Cloth  6 each Topical Q0600   darbepoetin (ARANESP) injection - DIALYSIS  60 mcg Subcutaneous Q Thu-1800   dextrose  1 ampule Intravenous Once   doxercalciferol  6 mcg Intravenous Q T,Th,Sa-HD   ferric citrate  630 mg Oral TID WC   heparin  2,500 Units Dialysis Once in dialysis   metroNIDAZOLE  500 mg Oral Q12H   midodrine  15 mg Oral Q8H   oxybutynin  5 mg Oral TID   vancomycin variable dose per unstable renal function (pharmacist dosing)   Does not apply See admin instructions     Assessment/ Plan: Pt is a 47 y.o. yo male with a past medical history significant for ESRD on HD, chronic hypotension, A-fib, CHF admitted with sepsis and a right foot infection.   OP HD: TTS HD  4.5h  500/ 500  140kg   2/2 bath  TDC  Hep 5000 units IV+ 2500  units IV midrun - hectorol 6 mcg IV three times per week - parsabiv 7.5 mg IV three times per week - no esa - DWELL TDC WITH CATH FLO POST HD three times per week   # Sepsis due to right foot infection/possible abscess: On Vanc + metronidazole. S/p R foot I&D 7/1 -> Cx growing MRSA. S/p 1st ray amputation 7/5. Needs 4 weeks of vancomycin per ID.    # ESRD TTS: Next HD 12/16/2022   # Chronic hypotension: SBP runs 70-80 range per patient, asymptomatic. Currently on midodrine 15mg  TID.   # Anemia of CKD: Hgb 9.4 - Aranesp q Thurs (last 7/4).    # CKD-MBD: Continue Auryxia, Hectorol. Parsabiv not on formulary here.  Monitor labs.   # Mild hyperkalemia: Improved with HD, monitor but still present. Order weekly K+ levels at  OP clinic. Marland Kitchen  Ianna Salmela H. Angelica Wix NP-C 12/16/2022, 9:21 AM  [employer] [phone]

## 2022-12-16 NOTE — Discharge Planning (Signed)
[facility] Patient Discharge Orders- [facility] CLINIC: [facility]  Patient's name: [name] Admit/DC Dates: 12/06/2022 - 12/16/2022  Discharge Diagnoses:  Severe sepsis from right foot ulcer/abscess and osteomyelitis: Incision and drainage 7/1, surgical cultures with MRSA Persistent infection, status post right great toe ray amputation 7/5.     Chronic hypotension  Aranesp: Given: No   Date and amount of last dose: Not given  Last Hgb: 8.8 PRBC's Given: NA Date/# of units: NA ESA dose for discharge: mircera 75 mcg  mcg IV q 2 weeks  IV Iron dose at discharge: Hold Venofer until IV ABX completed  Heparin change: No  EDW Change: No New EDW: NA  Bath Change: No  Access intervention/Change: No Details:  Hectorol/Calcitriol change: High corrected calcium. Decrease Hectorol to 3 mcg IV TIW Increase Parsabiv to 10 mg IV TIW  Discharge Labs: Calcium 10.2  Phosphorus 11.5 Albumin 2.8 K+ 5.9  IV Antibiotics: Vancomycin 1250 mg IV three times per week until 08/022024 Details: Check Vancomycin level Q Tuesday  On Coumadin?: No Last INR: Next INR: Managed By:   OTHER/APPTS/LAB ORDERS: Weekly Ca+ levels Weekly K+ levels Vancomycin level q Tuesday while on Vancomycin    D/C Meds to be reconciled by nurse after every discharge.  Completed By: Alonna Buckler [facility] [phone]    Reviewed by: MD:______ RN_______

## 2022-12-16 NOTE — Discharge Summary (Signed)
Physician Discharge Summary  [name] ZOX:[id number] DOB: 11-10-1975 DOA: 12/06/2022  PCP: [name], MD  Admit date: 12/06/2022 Discharge date: 12/16/2022  Admitted From: Home Disposition: Home  Recommendations for Outpatient Follow-up:  Follow up with PCP in 1-2 weeks Please obtain BMP/CBC in one week Vancomycin to be taken with hemodialysis, nephrology to schedule  Home Health: Declined Equipment/Devices: Walker available at home  Discharge Condition: Stable CODE STATUS: Full code Diet recommendation: Regular diet, nutritional supplements  Discharge summary: 47 year old ESRD on hemodialysis TTS with left femoral TDC, chronic Eliquis therapy for paroxysmal A-fib and clotted fistulas, sleep apnea not using CPAP presented to the hospital with 2 months of right foot ulcer not healing.  In the emergency room initially with low blood pressures, admitted to ICU, subsequently started on midodrine and transferred out of ICU.  Patient remained in the hospital for surgical intervention and IV antibiotics. He does have chronically low blood pressures and mostly asymptomatic. He has much clinical recovery today and going home.   # Severe sepsis from right foot ulcer/abscess and osteomyelitis: Incision and drainage 7/1, surgical cultures with MRSA Persistent infection, status post right great toe ray amputation 7/5.   Blood cultures with Staph epidermidis. Surgical cultures with MRSA. Seen by ID.  Recommended vancomycin for 4 weeks with hemodialysis, metronidazole 500 mg twice daily for 4 weeks.  End date 8/2. Nephrology to order vancomycin 1250 mg along with hemodialysis 3 times a week.  Communicated with nephrology team. Surgical shoe.  Weightbearing with heel. Continue to mobilize.  # Chronic hypotension: Asymptomatic.  On midodrine 15 mg 3 times daily.   # ESRD on hemodialysis, hyperkalemia: Improved.  Getting dialysis on his schedule.   # Chronic diastolic heart failure:  Euvolemic.   # OSA not on CPAP: Refusing CPAP at night.   # PAF on Eliquis: Normal sinus rhythm.  Not on any rate control medications.  On Eliquis.  Patient does have risk of bleeding into the wound, currently not an active issue.  Will need monitoring.   Medically stable to discharge.  Home health PT was recommended, patient tells me that he will work on his own.  Discharge Diagnoses:  Active Problems:   Essential hypertension   ESRD on dialysis [facility])   Acute osteomyelitis of right foot (HCC)   Sepsis (HCC)   Ulcer of right foot (HCC)   Cellulitis and abscess of toe of right foot    Discharge Instructions  Discharge Instructions     Diet - low sodium heart healthy   Complete by: As directed    Increase activity slowly   Complete by: As directed    Leave dressing on - Keep it clean, dry, and intact until clinic visit   Complete by: As directed       Allergies as of 12/16/2022   No Known Allergies      Medication List     TAKE these medications    apixaban 5 MG Tabs tablet Commonly known as: ELIQUIS Take 5 mg by mouth 2 (two) times daily.   atorvastatin 40 MG tablet Commonly known as: LIPITOR Take 1 tablet (40 mg total) by mouth daily.   CALCIUM 600 + D PO Take 2 tablets by mouth daily.   Dialyvite/Zinc Tabs Take 1 tablet by mouth daily.   ferric citrate 1 GM 210 MG(Fe) tablet Commonly known as: AURYXIA Take 630 mg by mouth in the morning, at noon, and at bedtime.   metroNIDAZOLE 500 MG tablet Commonly known  as: FLAGYL Take 1 tablet (500 mg total) by mouth every 12 (twelve) hours for 24 days.   midodrine 5 MG tablet Commonly known as: PROAMATINE Take 3 tablets (15 mg total) by mouth every 8 (eight) hours.   oxybutynin 5 MG tablet Commonly known as: DITROPAN Take 1 tablet (5 mg total) by mouth 3 (three) times daily.   oxyCODONE 5 MG immediate release tablet Commonly known as: Oxy IR/ROXICODONE Take 1 tablet (5 mg total) by mouth every 6 (six)  hours as needed for up to 5 days for moderate pain or severe pain.   vancomycin 1250 MG/250ML Soln Commonly known as: VANCOREADY Inject 250 mLs (1,250 mg total) into the vein Every Tuesday,Thursday,and Saturday with dialysis for 22 days. Start taking on: December 18, 2022               Discharge Care Instructions  (From admission, onward)           Start     Ordered   12/16/22 0000  Leave dressing on - Keep it clean, dry, and intact until clinic visit        12/16/22 0943            No Known Allergies  Consultations: Nephrology Podiatry Infectious disease Critical care   Procedures/Studies: DG Foot Complete Right  Result Date: 12/12/2022 CLINICAL DATA:  Osteomyelitis of right foot.  PACU wound check. EXAM: RIGHT FOOT COMPLETE - 3+ VIEW COMPARISON:  Right foot radiographs 12/08/2022; MRI right forefoot 11/26/2022 FINDINGS: Interval amputation of the first ray to the level of the mid shaft. There is subcutaneous air within the postsurgical distal surgical site, likely within normal limits recent surgery. The first metatarsal amputation margin is sharp. Mild plantar calcaneal heel spur. IMPRESSION: Interval amputation of the first ray to the level of the mid shaft. No complication is seen. Electronically Signed   By: Neita Garnet M.D.   On: 12/12/2022 11:29   VAS [place] ABI WITH/WO TBI  Result Date: 12/10/2022  LOWER EXTREMITY DOPPLER STUDY Patient Name:  [name]  Date of Exam:   12/10/2022 Medical Rec #: [id number]          Accession #:    [id number] Date of Birth: November 15, 1975          Patient Gender: M Patient Age:   29 years Exam Location:  [facility] Procedure:      VAS [place] ABI WITH/WO TBI Referring Phys: REBECCA SIKORA --------------------------------------------------------------------------------  Indications: Cellultis/RT foot infection, LT great toe ulceration High Risk Factors: Hypertension, hyperlipidemia, past history of smoking. Other Factors: ESRD (HD-  LLE), CHF, Afib.  Limitations: Today's exam was limited due to bandages and skin texture              (thickened, hard), venous interference. Comparison Study: Previous exam on 07/15/2022 was WNL Performing Technologist: Ernestene Mention RVT, RDMS  Examination Guidelines: A complete evaluation includes at minimum, Doppler waveform signals and systolic blood pressure reading at the level of bilateral brachial, anterior tibial, and posterior tibial arteries, when vessel segments are accessible. Bilateral testing is considered an integral part of a complete examination. Photoelectric Plethysmograph (PPG) waveforms and toe systolic pressure readings are included as required and additional duplex testing as needed. Limited examinations for reoccurring indications may be performed as noted.  ABI Findings: +--------+------------------+-----+-----------+-----------------+ Right   Rt Pressure (mmHg)IndexWaveform   Comment           +--------+------------------+-----+-----------+-----------------+ [id number]  triphasic                    +--------+------------------+-----+-----------+-----------------+ PTA     111               1.06 multiphasic                  +--------+------------------+-----+-----------+-----------------+ DP      104               0.99 triphasic  audibly triphasic +--------+------------------+-----+-----------+-----------------+ +---------+------------------+-----+---------+-----------------+ Left     Lt Pressure (mmHg)IndexWaveform Comment           +---------+------------------+-----+---------+-----------------+ Brachial 84                     triphasic                  +---------+------------------+-----+---------+-----------------+ PTA      101               0.96 triphasic                  +---------+------------------+-----+---------+-----------------+ DP       97                0.92 triphasicaudibly triphasic  +---------+------------------+-----+---------+-----------------+ Great Toe120               1.14 Normal                     +---------+------------------+-----+---------+-----------------+ +-------+-----------+-----------+------------+------------+ ABI/TBIToday's ABIToday's TBIPrevious ABIPrevious TBI +-------+-----------+-----------+------------+------------+ Right  1.06       bandaged   1.18                     +-------+-----------+-----------+------------+------------+ Left   0.96       1.14       0.99                     +-------+-----------+-----------+------------+------------+  Very difficult obtaining doppler signals due to venous interference.  Summary: Right: Resting right ankle-brachial index is within normal range. Unable to obtain TBI due to wound/bandage. Left: Resting left ankle-brachial index is within normal range. The left toe-brachial index is normal. Great toe too large for cuff, second toe used. *See table(s) above for measurements and observations.  Electronically signed by Coral Else MD on 12/10/2022 at 9:19:21 PM.    Final    DG Foot 2 Views Right  Result Date: 12/08/2022 CLINICAL DATA:  Diabetic foot ulcer EXAM: RIGHT FOOT - 2 VIEW COMPARISON:  MRI 12/06/2022, radiograph 12/06/2022 potentially due to surgical change if there was intervention here, otherwise gas-forming infection FINDINGS: No fracture or malalignment. Vascular calcifications. Moderate plantar calcaneal spur. Soft tissue edema. Mild hallux valgus deformity first MTP joint. No obvious osseous destructive change. Small gas within the soft tissues at the level of first MTP but also gas collection medial and dorsal aspect at the level of the midfoot. IMPRESSION: 1. No definite acute osseous abnormality. 2. Diffuse soft tissue edema. Small amount of gas at the level of first MTP presumably postsurgical. Additional foci of gas collection medial and dorsal aspect of the midfoot, possible postoperative gas  though slightly distant from reported surgical site at first MTP and would correlate with the patient's physical exam. Electronically Signed   By: Jasmine Pang M.D.   On: 12/08/2022 22:06   ECHOCARDIOGRAM COMPLETE  Result Date: 12/07/2022    ECHOCARDIOGRAM REPORT   Patient Name:   [name]  Emeline Darling Date of Exam: 12/07/2022 Medical Rec #:  [id number]         Height:       74.0 in Accession #:    [id number]        Weight:       312.6 lb Date of Birth:  26-Jun-1975         BSA:          2.628 m Patient Age:    47 years          BP:           82/52 mmHg Patient Gender: M                 HR:           90 bpm. Exam Location:  Inpatient Procedure: 2D Echo, Color Doppler and Cardiac Doppler Indications:    Fever  History:        Patient has prior history of Echocardiogram examinations, most                 recent 03/21/2020. CHF, Arrythmias:Atrial Fibrillation; Risk                 Factors:Hypertension, Dyslipidemia and Sleep Apnea.  Sonographer:    Irving Burton Senior RDCS Referring Phys: [id number] Josephine Igo  Sonographer Comments: Technically difficult due to patient body habitus. IMPRESSIONS  1. Left ventricular ejection fraction, by estimation, is 55 to 60%. The left ventricle has normal function. The left ventricle has no regional wall motion abnormalities. Left ventricular diastolic parameters were normal.  2. Right ventricular systolic function was not well visualized. The right ventricular size is normal. Tricuspid regurgitation signal is inadequate for assessing PA pressure.  3. The mitral valve is grossly normal. No evidence of mitral valve regurgitation. No evidence of mitral stenosis.  4. The aortic valve was not well visualized. Aortic valve regurgitation is not visualized. No aortic stenosis is present.  5. The inferior vena cava is normal in size with greater than 50% respiratory variability, suggesting right atrial pressure of 3 mmHg. FINDINGS  Left Ventricle: Left ventricular ejection fraction, by estimation,  is 55 to 60%. The left ventricle has normal function. The left ventricle has no regional wall motion abnormalities. The left ventricular internal cavity size was normal in size. There is  no left ventricular hypertrophy. Left ventricular diastolic parameters were normal. Right Ventricle: The right ventricular size is normal. No increase in right ventricular wall thickness. Right ventricular systolic function was not well visualized. Tricuspid regurgitation signal is inadequate for assessing PA pressure. Left Atrium: Left atrial size was normal in size. Right Atrium: Right atrial size was normal in size. Pericardium: There is no evidence of pericardial effusion. Mitral Valve: The mitral valve is grossly normal. No evidence of mitral valve regurgitation. No evidence of mitral valve stenosis. Tricuspid Valve: The tricuspid valve is grossly normal. Tricuspid valve regurgitation is not demonstrated. No evidence of tricuspid stenosis. Aortic Valve: The aortic valve was not well visualized. Aortic valve regurgitation is not visualized. No aortic stenosis is present. Pulmonic Valve: The pulmonic valve was not well visualized. Pulmonic valve regurgitation is not visualized. No evidence of pulmonic stenosis. Aorta: The aortic root and ascending aorta are structurally normal, with no evidence of dilitation. Venous: The inferior vena cava is normal in size with greater than 50% respiratory variability, suggesting right atrial pressure of 3 mmHg. IAS/Shunts: No atrial level shunt detected by color flow Doppler.  LEFT VENTRICLE PLAX 2D  LVIDd:         3.70 cm   Diastology LVIDs:         2.70 cm   LV e' medial:    7.29 cm/s LV PW:         1.00 cm   LV E/e' medial:  9.6 LV IVS:        0.90 cm   LV e' lateral:   9.36 cm/s LVOT diam:     2.30 cm   LV E/e' lateral: 7.5 LV SV:         71 LV SV Index:   27 LVOT Area:     4.15 cm  RIGHT VENTRICLE RV S prime:     8.70 cm/s TAPSE (M-mode): 2.0 cm LEFT ATRIUM             Index        RIGHT  ATRIUM           Index LA diam:        3.00 cm 1.14 cm/m   RA Area:     13.40 cm LA Vol (A2C):   33.5 ml 12.75 ml/m  RA Volume:   29.10 ml  11.07 ml/m LA Vol (A4C):   41.2 ml 15.68 ml/m LA Biplane Vol: 38.6 ml 14.69 ml/m  AORTIC VALVE LVOT Vmax:   99.50 cm/s LVOT Vmean:  70.700 cm/s LVOT VTI:    0.170 m  AORTA Ao Root diam: 3.20 cm Ao Asc diam:  3.10 cm MITRAL VALVE MV Area (PHT): 3.87 cm    SHUNTS MV Decel Time: 196 msec    Systemic VTI:  0.17 m MV E velocity: 70.00 cm/s  Systemic Diam: 2.30 cm MV A velocity: 62.90 cm/s MV E/A ratio:  1.11 Vishnu Priya Mallipeddi Electronically signed by Winfield Rast Mallipeddi Signature Date/Time: 12/07/2022/4:06:07 PM    Final    DG Chest 1 View  Result Date: 12/07/2022 CLINICAL DATA:  47 year old male history of sepsis. EXAM: CHEST  1 VIEW COMPARISON:  Chest x-ray 12/06/2022. FINDINGS: Lung volumes are normal. No consolidative airspace disease. No pleural effusions. No pneumothorax. No pulmonary nodule or mass noted. Pulmonary vasculature and the cardiomediastinal silhouette are within normal limits. Atherosclerosis in the thoracic aorta. IMPRESSION: 1.  No radiographic evidence of acute cardiopulmonary disease. 2. Aortic atherosclerosis. Electronically Signed   By: Trudie Reed M.D.   On: 12/07/2022 05:26   MR FOOT RIGHT WO CONTRAST  Result Date: 12/06/2022 CLINICAL DATA:  End-stage renal disease, congestive heart failure with worsening right foot pain. Ulcer drainage. Fever. Patient noticed small ulcer on plantar right foot 2 months ago with intermittent 10 purulent discharge. 1 week ago patient noticed a callus develop covering the ulcer and use pain nail cutter to remove the callus with copious amounts of pus coming out. 2 days ago patient started to feel severe pain of right foot extending to mid shin. Elevated white blood cell count. Evaluate for osteomyelitis. EXAM: MRI OF THE RIGHT FOREFOOT WITHOUT CONTRAST TECHNIQUE: Multiplanar, multisequence MR  imaging of the right foot radiographs 12/06/2022 was performed. No intravenous contrast was administered. COMPARISON:  None Available. FINDINGS: Bones/Joint/Cartilage Mild to moderate hallux valgus. Moderate great toe metatarsophalangeal cartilage thinning and peripheral osteophytosis. Mild great toe metatarsophalangeal joint effusion. No cortical erosion or marrow edema is seen to indicate MRI evidence of acute osteomyelitis. Ligaments The great toe metatarsophalangeal and interphalangeal collateral ligaments appear intact. The Lisfranc ligament complex is intact. Muscles and Tendons There is diffuse edema seen throughout the plantar  foot musculature. Soft tissues There is mild irregularity of the plantar skin at the level of the great toe metatarsophalangeal joint (sagittal series 9, image 10 and axial series 6, image 35) likely representing a small ulcer/wound. Deep to this, there is decreased T1 increased T2 signal edema throughout the plantar great toe subcutaneous fat that also extends throughout the subcutaneous fat medial to the great toe and slightly superomedial to the great toe. There are tiny possibly communicating regions of coalescing fluid seen measuring up to 4 mm in craniocaudal thickness within the subcutaneous fat bordering the dorsal medial base of the proximal phalanx of the great toe (axial series 6, image 33 and coronal series 7, image 19) and measuring up to 5 mm in craniocaudal thickness dorsal medial to the majority of the shaft of the proximal phalanx of the great toe (coronal series 7 images 21 through 25 and axial series 6 images 34 through 41). These are suspicious for small abscesses. There is diffuse moderate medial greater than lateral great toe and medial greater than lateral dorsal midfoot subcutaneous fat edema and swelling. IMPRESSION: 1. Small plantar skin ulcer at the level of the great toe metatarsophalangeal joint with associated soft tissue edema and swelling about the  medial greater than lateral aspects of the great toe and medial greater than lateral aspects of the midfoot consistent with cellulitis. There are regions of coalescing fluid medial to the great toe metatarsophalangeal joints and dorsal medial to the length of the proximal phalanx of the great toe suspicious for abscesses. 2. No cortical erosion or marrow edema is seen to indicate MRI evidence of acute osteomyelitis. Electronically Signed   By: Neita Garnet M.D.   On: 12/06/2022 20:14   DG Shoulder Right  Result Date: 12/06/2022 CLINICAL DATA:  pain and possible infection EXAM: RIGHT SHOULDER - 2+ VIEW COMPARISON:  None Available. FINDINGS: Borderline widening of the [facility] joint. No fracture or dislocation. Normal mineralization. No significant osseous degenerative change. IMPRESSION: Borderline widening of the [facility] joint. No fracture or other acute findings. Electronically Signed   By: Corlis Leak M.D.   On: 12/06/2022 11:42   DG Foot Complete Right  Result Date: 12/06/2022 CLINICAL DATA:  pain and possible infection EXAM: RIGHT FOOT COMPLETE - 3+ VIEW COMPARISON:  None Available. FINDINGS: No fracture or dislocation. Normal mineralization. Mild hallux valgus deformity. Small calcaneal spurs. No subcutaneous gas or radiodense foreign body. IMPRESSION: 1. No acute findings. 2. Mild hallux valgus. Electronically Signed   By: Corlis Leak M.D.   On: 12/06/2022 11:37   DG Chest 2 View  Result Date: 12/06/2022 CLINICAL DATA:  [address] Infection [address] EXAM: CHEST - 2 VIEW COMPARISON:  02/15/2021 FINDINGS: Low lung volumes with some crowding of bronchovascular structures in the lung bases. No focal infiltrate or overt edema. Heart size and mediastinal contours are within normal limits. Aortic Atherosclerosis (ICD10-170.0). No effusion. Visualized bones unremarkable. IMPRESSION: Low lung volumes. No acute findings. Electronically Signed   By: Corlis Leak M.D.   On: 12/06/2022 11:35   (Echo, Carotid, EGD, Colonoscopy,  ERCP)    Subjective: Patient seen and examined in the morning rounds.  He is receiving hemodialysis.  Denies any complaints.  Pain is controlled.  Occasionally using oxycodone that will be prescribed.  He is eager to discharge home.   Discharge Exam: Vitals:   12/16/22 1030 12/16/22 1100  BP: (!) 80/50 (!) 81/54  Pulse: 77 76  Resp: 10 10  Temp:    SpO2: 97% 97%  Vitals:   12/16/22 0930 12/16/22 1000 12/16/22 1030 12/16/22 1100  BP: (!) 68/50 (!) 72/44 (!) 80/50 (!) 81/54  Pulse: 74 71 77 76  Resp: 12 16 10 10   Temp:      TempSrc:      SpO2: 100% 99% 97% 97%  Weight:      Height:        General: Pt is alert, awake, not in acute distress Receiving hemodialysis. Cardiovascular: RRR, S1/S2 +, no rubs, no gallops Respiratory: CTA bilaterally, no wheezing, no rhonchi Abdominal: Soft, NT, ND, bowel sounds + Extremities: no edema, no cyanosis Receiving dialysis through the left groin. Right foot with postsurgical dressing, dry and intact.    The results of significant diagnostics from this hospitalization (including imaging, microbiology, ancillary and laboratory) are listed below for reference.     Microbiology: Recent Results (from the past 240 hour(s))  Blood culture (routine x 2)     Status: None   Collection Time: 12/06/22 12:20 PM   Specimen: BLOOD LEFT HAND  Result Value Ref Range Status   Specimen Description BLOOD LEFT HAND  Final   Special Requests   Final    BOTTLES DRAWN AEROBIC ONLY Blood Culture results may not be optimal due to an inadequate volume of blood received in culture bottles   Culture   Final    NO GROWTH 5 DAYS Performed at [facility], [address] [place], [place] [address]    Report Status 12/11/2022 FINAL  Final  Blood culture (routine x 2)     Status: Abnormal   Collection Time: 12/06/22 12:45 PM   Specimen: BLOOD  Result Value Ref Range Status   Specimen Description BLOOD SITE NOT SPECIFIED  Final   Special Requests    Final    BOTTLES DRAWN AEROBIC AND ANAEROBIC Blood Culture results may not be optimal due to an inadequate volume of blood received in culture bottles   Culture  Setup Time   Final    GRAM POSITIVE COCCI AEROBIC BOTTLE ONLY CRITICAL RESULT CALLED TO, READ BACK BY AND VERIFIED WITH: PHARMD LAUREN BELL [id number] AT 1730 BY EC    Culture (A)  Final    STAPHYLOCOCCUS EPIDERMIDIS THE SIGNIFICANCE OF ISOLATING THIS ORGANISM FROM A SINGLE VENIPUNCTURE CANNOT BE PREDICTED WITHOUT FURTHER CLINICAL AND CULTURE CORRELATION. SUSCEPTIBILITIES AVAILABLE ONLY ON REQUEST. Performed at [facility], [address] [place], [place] [address]    Report Status 12/08/2022 FINAL  Final  Blood Culture ID Panel (Reflexed)     Status: Abnormal   Collection Time: 12/06/22 12:45 PM  Result Value Ref Range Status   Enterococcus faecalis NOT DETECTED NOT DETECTED Final   Enterococcus Faecium NOT DETECTED NOT DETECTED Final   Listeria monocytogenes NOT DETECTED NOT DETECTED Final   Staphylococcus species DETECTED (A) NOT DETECTED Final    Comment: CRITICAL RESULT CALLED TO, READ BACK BY AND VERIFIED WITH: PHARMD LAUREN BELL [id number] AT 1730 BY EC    Staphylococcus aureus (BCID) NOT DETECTED NOT DETECTED Final   Staphylococcus epidermidis DETECTED (A) NOT DETECTED Final    Comment: Methicillin (oxacillin) resistant coagulase negative staphylococcus. Possible blood culture contaminant (unless isolated from more than one blood culture draw or clinical case suggests pathogenicity). No antibiotic treatment is indicated for blood  culture contaminants. REPEAT WITH NEW VIAL OF CTRL PHARMD LAUREN BELL [id number] AT 1730 BY EC    Staphylococcus lugdunensis NOT DETECTED NOT DETECTED Final   Streptococcus species NOT DETECTED NOT DETECTED Final  Streptococcus agalactiae NOT DETECTED NOT DETECTED Final   Streptococcus pneumoniae NOT DETECTED NOT DETECTED Final   Streptococcus pyogenes NOT DETECTED NOT DETECTED Final    A.calcoaceticus-baumannii NOT DETECTED NOT DETECTED Final   Bacteroides fragilis NOT DETECTED NOT DETECTED Final   Enterobacterales NOT DETECTED NOT DETECTED Final   Enterobacter cloacae complex NOT DETECTED NOT DETECTED Final   Escherichia coli NOT DETECTED NOT DETECTED Final   Klebsiella aerogenes NOT DETECTED NOT DETECTED Final   Klebsiella oxytoca NOT DETECTED NOT DETECTED Final   Klebsiella pneumoniae NOT DETECTED NOT DETECTED Final   Proteus species NOT DETECTED NOT DETECTED Final   Salmonella species NOT DETECTED NOT DETECTED Final   Serratia marcescens NOT DETECTED NOT DETECTED Final   Haemophilus influenzae NOT DETECTED NOT DETECTED Final   Neisseria meningitidis NOT DETECTED NOT DETECTED Final   Pseudomonas aeruginosa NOT DETECTED NOT DETECTED Final   Stenotrophomonas maltophilia NOT DETECTED NOT DETECTED Final   Candida albicans NOT DETECTED NOT DETECTED Final   Candida auris NOT DETECTED NOT DETECTED Final   Candida glabrata NOT DETECTED NOT DETECTED Final   Candida krusei NOT DETECTED NOT DETECTED Final   Candida parapsilosis NOT DETECTED NOT DETECTED Final   Candida tropicalis NOT DETECTED NOT DETECTED Final   Cryptococcus neoformans/gattii NOT DETECTED NOT DETECTED Final   Methicillin resistance mecA/C DETECTED (A) NOT DETECTED Final    Comment: CRITICAL RESULT CALLED TO, READ BACK BY AND VERIFIED WITH: Chestine Spore [id number] AT 1730 BY EC Performed at [facility], [address], [place], [place] [address]   MRSA Next Gen by PCR, Nasal     Status: Abnormal   Collection Time: 12/06/22  4:33 PM   Specimen: Nasal Mucosa; Nasal Swab  Result Value Ref Range Status   MRSA by PCR Next Gen DETECTED (A) NOT DETECTED Final    Comment: RESULT CALLED TO, READ BACK BY AND VERIFIED WITH: 12/06/2022 AT 1836 TO RN Donney Rankins., ADC (NOTE) The GeneXpert MRSA Assay (FDA approved for NASAL specimens only), is one component of a comprehensive MRSA colonization  surveillance program. It is not intended to diagnose MRSA infection nor to guide or monitor treatment for MRSA infections. Test performance is not FDA approved in patients less than 86 years old. Performed at [facility], [address], [place], [place] [address]   Aerobic/Anaerobic Culture w Gram Stain (surgical/deep wound)     Status: None   Collection Time: 12/08/22  5:12 PM   Specimen: Path fluid; Tissue  Result Value Ref Range Status   Specimen Description WOUND  Final   Special Requests RIGHT FOOT  Final   Gram Stain   Final    RARE WBC PRESENT, PREDOMINANTLY PMN NO ORGANISMS SEEN    Culture   Final    MODERATE METHICILLIN RESISTANT STAPHYLOCOCCUS AUREUS MODERATE BACTEROIDES FRAGILIS BETA LACTAMASE POSITIVE Performed at [facility], [address], [place], [place] [address]    Report Status 12/11/2022 FINAL  Final   Organism ID, Bacteria METHICILLIN RESISTANT STAPHYLOCOCCUS AUREUS  Final      Susceptibility   Methicillin resistant staphylococcus aureus - MIC*    CIPROFLOXACIN <=0.5 SENSITIVE Sensitive     ERYTHROMYCIN >=8 RESISTANT Resistant     GENTAMICIN <=0.5 SENSITIVE Sensitive     OXACILLIN >=4 RESISTANT Resistant     TETRACYCLINE <=1 SENSITIVE Sensitive     VANCOMYCIN 1 SENSITIVE Sensitive     TRIMETH/SULFA <=10 SENSITIVE Sensitive     CLINDAMYCIN <=0.25 SENSITIVE Sensitive  RIFAMPIN <=0.5 SENSITIVE Sensitive     Inducible Clindamycin NEGATIVE Sensitive     LINEZOLID 2 SENSITIVE Sensitive     * MODERATE METHICILLIN RESISTANT STAPHYLOCOCCUS AUREUS  Aerobic/Anaerobic Culture w Gram Stain (surgical/deep wound)     Status: None   Collection Time: 12/08/22  5:21 PM   Specimen: Path fluid; Tissue  Result Value Ref Range Status   Specimen Description WOUND  Final   Special Requests FIRST METATARSAL  Final   Gram Stain NO WBC SEEN NO ORGANISMS SEEN   Final   Culture   Final    FEW METHICILLIN RESISTANT STAPHYLOCOCCUS AUREUS FEW BACTEROIDES  FRAGILIS BETA LACTAMASE POSITIVE Performed at [facility], [address] [place], [place] [address]    Report Status 12/11/2022 FINAL  Final   Organism ID, Bacteria METHICILLIN RESISTANT STAPHYLOCOCCUS AUREUS  Final      Susceptibility   Methicillin resistant staphylococcus aureus - MIC*    CIPROFLOXACIN <=0.5 SENSITIVE Sensitive     ERYTHROMYCIN >=8 RESISTANT Resistant     GENTAMICIN <=0.5 SENSITIVE Sensitive     OXACILLIN >=4 RESISTANT Resistant     TETRACYCLINE <=1 SENSITIVE Sensitive     VANCOMYCIN 1 SENSITIVE Sensitive     TRIMETH/SULFA <=10 SENSITIVE Sensitive     CLINDAMYCIN <=0.25 SENSITIVE Sensitive     RIFAMPIN <=0.5 SENSITIVE Sensitive     Inducible Clindamycin NEGATIVE Sensitive     LINEZOLID 2 SENSITIVE Sensitive     * FEW METHICILLIN RESISTANT STAPHYLOCOCCUS AUREUS  Aerobic/Anaerobic Culture w Gram Stain (surgical/deep wound)     Status: None   Collection Time: 12/12/22  9:28 AM   Specimen: Foot, Right; Amputation  Result Value Ref Range Status   Specimen Description TOE  Final   Special Requests RT FOOT  Final   Gram Stain NO WBC SEEN NO ORGANISMS SEEN   Final   Culture   Final    RARE METHICILLIN RESISTANT STAPHYLOCOCCUS AUREUS FEW BACTEROIDES FRAGILIS BETA LACTAMASE POSITIVE Performed at [facility], [address] [place], [place] [address]    Report Status 12/15/2022 FINAL  Final   Organism ID, Bacteria METHICILLIN RESISTANT STAPHYLOCOCCUS AUREUS  Final      Susceptibility   Methicillin resistant staphylococcus aureus - MIC*    CIPROFLOXACIN <=0.5 SENSITIVE Sensitive     ERYTHROMYCIN >=8 RESISTANT Resistant     GENTAMICIN <=0.5 SENSITIVE Sensitive     OXACILLIN >=4 RESISTANT Resistant     TETRACYCLINE <=1 SENSITIVE Sensitive     VANCOMYCIN 1 SENSITIVE Sensitive     TRIMETH/SULFA <=10 SENSITIVE Sensitive     CLINDAMYCIN <=0.25 SENSITIVE Sensitive     RIFAMPIN <=0.5 SENSITIVE Sensitive     Inducible Clindamycin NEGATIVE Sensitive      LINEZOLID 2 SENSITIVE Sensitive     * RARE METHICILLIN RESISTANT STAPHYLOCOCCUS AUREUS     Labs: BNP (last 3 results) No results for input(s): "BNP" in the last 8760 hours. Basic Metabolic Panel: Recent Labs  Lab 12/10/22 0046 12/11/22 0915 12/16/22 0730  NA 129* 131* 129*  K 5.1 5.2* 5.9*  CL 91* 91* 91*  CO2 22 22 20*  GLUCOSE 88 103* 83  BUN 58* 80* 90*  CREATININE 13.67* 15.98* 16.75*  CALCIUM 9.5 9.8 10.2  MG 2.1  --   --   PHOS  --  7.4* 11.5*   Liver Function Tests: Recent Labs  Lab 12/11/22 0915 12/16/22 0730  ALBUMIN 2.5* 2.8*   No results for input(s): "LIPASE", "AMYLASE" in the last  168 hours. No results for input(s): "AMMONIA" in the last 168 hours. CBC: Recent Labs  Lab 12/10/22 0046 12/11/22 0915 12/15/22 1542 12/16/22 0730  WBC 13.7* 13.9* 8.2 8.0  NEUTROABS  --   --  6.2  --   HGB 9.6* 9.4* 8.7* 8.8*  HCT 30.1* 29.3* 27.6* 28.3*  MCV 89.9 92.1 90.8 91.0  PLT 231 295 357 381   Cardiac Enzymes: No results for input(s): "CKTOTAL", "CKMB", "CKMBINDEX", "TROPONINI" in the last 168 hours. BNP: Invalid input(s): "POCBNP" CBG: Recent Labs  Lab 12/13/22 1557 12/13/22 2024 12/14/22 0031 12/14/22 0450 12/14/22 0753  GLUCAP 104* 118* 115* 89 82   D-Dimer No results for input(s): "DDIMER" in the last 72 hours. Hgb A1c No results for input(s): "HGBA1C" in the last 72 hours. Lipid Profile No results for input(s): "CHOL", "HDL", "LDLCALC", "TRIG", "CHOLHDL", "LDLDIRECT" in the last 72 hours. Thyroid function studies No results for input(s): "TSH", "T4TOTAL", "T3FREE", "THYROIDAB" in the last 72 hours.  Invalid input(s): "FREET3" Anemia work up No results for input(s): "VITAMINB12", "FOLATE", "FERRITIN", "TIBC", "IRON", "RETICCTPCT" in the last 72 hours. Urinalysis    Component Value Date/Time   COLORURINE STRAW (A) 05/10/2018 1811   APPEARANCEUR CLEAR 05/10/2018 1811   LABSPEC 1.012 05/10/2018 1811   PHURINE 7.0 05/10/2018 1811    GLUCOSEU 150 (A) 05/10/2018 1811   HGBUR SMALL (A) 05/10/2018 1811   BILIRUBINUR NEGATIVE 11/03/2018 1515   KETONESUR NEGATIVE 05/10/2018 1811   PROTEINUR Positive (A) 11/03/2018 1515   PROTEINUR >=300 (A) 05/10/2018 1811   UROBILINOGEN 0.2 11/03/2018 1515   NITRITE NEGATIVE 11/03/2018 1515   NITRITE NEGATIVE 05/10/2018 1811   LEUKOCYTESUR Small (1+) (A) 11/03/2018 1515   Sepsis Labs Recent Labs  Lab 12/10/22 0046 12/11/22 0915 12/15/22 1542 12/16/22 0730  WBC 13.7* 13.9* 8.2 8.0   Microbiology Recent Results (from the past 240 hour(s))  Blood culture (routine x 2)     Status: None   Collection Time: 12/06/22 12:20 PM   Specimen: BLOOD LEFT HAND  Result Value Ref Range Status   Specimen Description BLOOD LEFT HAND  Final   Special Requests   Final    BOTTLES DRAWN AEROBIC ONLY Blood Culture results may not be optimal due to an inadequate volume of blood received in culture bottles   Culture   Final    NO GROWTH 5 DAYS Performed at [facility], [address], [place], [place] [address]    Report Status 12/11/2022 FINAL  Final  Blood culture (routine x 2)     Status: Abnormal   Collection Time: 12/06/22 12:45 PM   Specimen: BLOOD  Result Value Ref Range Status   Specimen Description BLOOD SITE NOT SPECIFIED  Final   Special Requests   Final    BOTTLES DRAWN AEROBIC AND ANAEROBIC Blood Culture results may not be optimal due to an inadequate volume of blood received in culture bottles   Culture  Setup Time   Final    GRAM POSITIVE COCCI AEROBIC BOTTLE ONLY CRITICAL RESULT CALLED TO, READ BACK BY AND VERIFIED WITH: PHARMD LAUREN BELL [id number] AT 1730 BY EC    Culture (A)  Final    STAPHYLOCOCCUS EPIDERMIDIS THE SIGNIFICANCE OF ISOLATING THIS ORGANISM FROM A SINGLE VENIPUNCTURE CANNOT BE PREDICTED WITHOUT FURTHER CLINICAL AND CULTURE CORRELATION. SUSCEPTIBILITIES AVAILABLE ONLY ON REQUEST. Performed at [facility], [address], [place], [place] [address]     Report Status 12/08/2022 FINAL  Final  Blood Culture ID Panel (  Reflexed)     Status: Abnormal   Collection Time: 12/06/22 12:45 PM  Result Value Ref Range Status   Enterococcus faecalis NOT DETECTED NOT DETECTED Final   Enterococcus Faecium NOT DETECTED NOT DETECTED Final   Listeria monocytogenes NOT DETECTED NOT DETECTED Final   Staphylococcus species DETECTED (A) NOT DETECTED Final    Comment: CRITICAL RESULT CALLED TO, READ BACK BY AND VERIFIED WITH: PHARMD LAUREN BELL [id number] AT 1730 BY EC    Staphylococcus aureus (BCID) NOT DETECTED NOT DETECTED Final   Staphylococcus epidermidis DETECTED (A) NOT DETECTED Final    Comment: Methicillin (oxacillin) resistant coagulase negative staphylococcus. Possible blood culture contaminant (unless isolated from more than one blood culture draw or clinical case suggests pathogenicity). No antibiotic treatment is indicated for blood  culture contaminants. REPEAT WITH NEW VIAL OF CTRL PHARMD LAUREN BELL [id number] AT 1730 BY EC    Staphylococcus lugdunensis NOT DETECTED NOT DETECTED Final   Streptococcus species NOT DETECTED NOT DETECTED Final   Streptococcus agalactiae NOT DETECTED NOT DETECTED Final   Streptococcus pneumoniae NOT DETECTED NOT DETECTED Final   Streptococcus pyogenes NOT DETECTED NOT DETECTED Final   A.calcoaceticus-baumannii NOT DETECTED NOT DETECTED Final   Bacteroides fragilis NOT DETECTED NOT DETECTED Final   Enterobacterales NOT DETECTED NOT DETECTED Final   Enterobacter cloacae complex NOT DETECTED NOT DETECTED Final   Escherichia coli NOT DETECTED NOT DETECTED Final   Klebsiella aerogenes NOT DETECTED NOT DETECTED Final   Klebsiella oxytoca NOT DETECTED NOT DETECTED Final   Klebsiella pneumoniae NOT DETECTED NOT DETECTED Final   Proteus species NOT DETECTED NOT DETECTED Final   Salmonella species NOT DETECTED NOT DETECTED Final   Serratia marcescens NOT DETECTED NOT DETECTED Final   Haemophilus influenzae NOT DETECTED  NOT DETECTED Final   Neisseria meningitidis NOT DETECTED NOT DETECTED Final   Pseudomonas aeruginosa NOT DETECTED NOT DETECTED Final   Stenotrophomonas maltophilia NOT DETECTED NOT DETECTED Final   Candida albicans NOT DETECTED NOT DETECTED Final   Candida auris NOT DETECTED NOT DETECTED Final   Candida glabrata NOT DETECTED NOT DETECTED Final   Candida krusei NOT DETECTED NOT DETECTED Final   Candida parapsilosis NOT DETECTED NOT DETECTED Final   Candida tropicalis NOT DETECTED NOT DETECTED Final   Cryptococcus neoformans/gattii NOT DETECTED NOT DETECTED Final   Methicillin resistance mecA/C DETECTED (A) NOT DETECTED Final    Comment: CRITICAL RESULT CALLED TO, READ BACK BY AND VERIFIED WITH: Chestine Spore [id number] AT 1730 BY EC Performed at [facility], [address] [place], [place] [address]   MRSA Next Gen by PCR, Nasal     Status: Abnormal   Collection Time: 12/06/22  4:33 PM   Specimen: Nasal Mucosa; Nasal Swab  Result Value Ref Range Status   MRSA by PCR Next Gen DETECTED (A) NOT DETECTED Final    Comment: RESULT CALLED TO, READ BACK BY AND VERIFIED WITH: 12/06/2022 AT 1836 TO RN Donney Rankins., ADC (NOTE) The GeneXpert MRSA Assay (FDA approved for NASAL specimens only), is one component of a comprehensive MRSA colonization surveillance program. It is not intended to diagnose MRSA infection nor to guide or monitor treatment for MRSA infections. Test performance is not FDA approved in patients less than 41 years old. Performed at [facility], [address], [place], [place] [address]   Aerobic/Anaerobic Culture w Gram Stain (surgical/deep wound)     Status: None   Collection Time: 12/08/22  5:12 PM   Specimen: Path fluid; Tissue  Result Value Ref Range Status   Specimen Description WOUND  Final   Special Requests RIGHT FOOT  Final   Gram Stain   Final    RARE WBC PRESENT, PREDOMINANTLY PMN NO ORGANISMS SEEN    Culture   Final    MODERATE METHICILLIN  RESISTANT STAPHYLOCOCCUS AUREUS MODERATE BACTEROIDES FRAGILIS BETA LACTAMASE POSITIVE Performed at [facility], [address] [place], [place] [address]    Report Status 12/11/2022 FINAL  Final   Organism ID, Bacteria METHICILLIN RESISTANT STAPHYLOCOCCUS AUREUS  Final      Susceptibility   Methicillin resistant staphylococcus aureus - MIC*    CIPROFLOXACIN <=0.5 SENSITIVE Sensitive     ERYTHROMYCIN >=8 RESISTANT Resistant     GENTAMICIN <=0.5 SENSITIVE Sensitive     OXACILLIN >=4 RESISTANT Resistant     TETRACYCLINE <=1 SENSITIVE Sensitive     VANCOMYCIN 1 SENSITIVE Sensitive     TRIMETH/SULFA <=10 SENSITIVE Sensitive     CLINDAMYCIN <=0.25 SENSITIVE Sensitive     RIFAMPIN <=0.5 SENSITIVE Sensitive     Inducible Clindamycin NEGATIVE Sensitive     LINEZOLID 2 SENSITIVE Sensitive     * MODERATE METHICILLIN RESISTANT STAPHYLOCOCCUS AUREUS  Aerobic/Anaerobic Culture w Gram Stain (surgical/deep wound)     Status: None   Collection Time: 12/08/22  5:21 PM   Specimen: Path fluid; Tissue  Result Value Ref Range Status   Specimen Description WOUND  Final   Special Requests FIRST METATARSAL  Final   Gram Stain NO WBC SEEN NO ORGANISMS SEEN   Final   Culture   Final    FEW METHICILLIN RESISTANT STAPHYLOCOCCUS AUREUS FEW BACTEROIDES FRAGILIS BETA LACTAMASE POSITIVE Performed at [facility], [address], [place], [place] [address]    Report Status 12/11/2022 FINAL  Final   Organism ID, Bacteria METHICILLIN RESISTANT STAPHYLOCOCCUS AUREUS  Final      Susceptibility   Methicillin resistant staphylococcus aureus - MIC*    CIPROFLOXACIN <=0.5 SENSITIVE Sensitive     ERYTHROMYCIN >=8 RESISTANT Resistant     GENTAMICIN <=0.5 SENSITIVE Sensitive     OXACILLIN >=4 RESISTANT Resistant     TETRACYCLINE <=1 SENSITIVE Sensitive     VANCOMYCIN 1 SENSITIVE Sensitive     TRIMETH/SULFA <=10 SENSITIVE Sensitive     CLINDAMYCIN <=0.25 SENSITIVE Sensitive     RIFAMPIN <=0.5  SENSITIVE Sensitive     Inducible Clindamycin NEGATIVE Sensitive     LINEZOLID 2 SENSITIVE Sensitive     * FEW METHICILLIN RESISTANT STAPHYLOCOCCUS AUREUS  Aerobic/Anaerobic Culture w Gram Stain (surgical/deep wound)     Status: None   Collection Time: 12/12/22  9:28 AM   Specimen: Foot, Right; Amputation  Result Value Ref Range Status   Specimen Description TOE  Final   Special Requests RT FOOT  Final   Gram Stain NO WBC SEEN NO ORGANISMS SEEN   Final   Culture   Final    RARE METHICILLIN RESISTANT STAPHYLOCOCCUS AUREUS FEW BACTEROIDES FRAGILIS BETA LACTAMASE POSITIVE Performed at [facility], [address], [place], [place] [address]    Report Status 12/15/2022 FINAL  Final   Organism ID, Bacteria METHICILLIN RESISTANT STAPHYLOCOCCUS AUREUS  Final      Susceptibility   Methicillin resistant staphylococcus aureus - MIC*    CIPROFLOXACIN <=0.5 SENSITIVE Sensitive     ERYTHROMYCIN >=8 RESISTANT Resistant     GENTAMICIN <=0.5 SENSITIVE Sensitive     OXACILLIN >=4 RESISTANT Resistant     TETRACYCLINE <=1 SENSITIVE Sensitive  VANCOMYCIN 1 SENSITIVE Sensitive     TRIMETH/SULFA <=10 SENSITIVE Sensitive     CLINDAMYCIN <=0.25 SENSITIVE Sensitive     RIFAMPIN <=0.5 SENSITIVE Sensitive     Inducible Clindamycin NEGATIVE Sensitive     LINEZOLID 2 SENSITIVE Sensitive     * RARE METHICILLIN RESISTANT STAPHYLOCOCCUS AUREUS     Time coordinating discharge: 35 minutes  SIGNED:   [name], MD  [facility] 12/16/2022, 11:29 AM

## 2022-12-16 NOTE — Progress Notes (Addendum)
PHARMACY CONSULT NOTE FOR:  OUTPATIENT  PARENTERAL ANTIBIOTIC THERAPY (OPAT)  This serves as informational only as patient's HD center will manage antibiotics.  Indication: osteomyelitis Regimen: vancomycin 1250mg  IV qHD (TTS) + metronidazole 500mg  PO q12 hours End date: 01/03/23    Thank you for allowing pharmacy to be a part of this patient's care.  Rexford Maus, PharmD, BCPS 12/16/2022 10:48 AM

## 2022-12-17 ENCOUNTER — Telehealth: Payer: Self-pay

## 2022-12-17 ENCOUNTER — Telehealth: Payer: Self-pay | Admitting: Family Medicine

## 2022-12-17 ENCOUNTER — Telehealth: Payer: Self-pay | Admitting: Nurse Practitioner

## 2022-12-17 NOTE — Transitions of Care (Post Inpatient/ED Visit) (Signed)
12/17/2022  Name: [name] MRN: [id number] DOB: May 16, 1976  Today's TOC FU Call Status: Today's TOC FU Call Status:: Successful TOC FU Call Competed TOC FU Call Complete Date: 12/17/22  Transition Care Management Follow-up Telephone Call Date of Discharge: 12/16/22 Discharge Facility: [facility]) Type of Discharge: Inpatient Admission Primary Inpatient Discharge Diagnosis:: Right foot osteomyelitis How have you been since you were released from the hospital?: Better Any questions or concerns?: No  Items Reviewed: Did you receive and understand the discharge instructions provided?: Yes Medications obtained,verified, and reconciled?: Yes (Medications Reviewed) Any new allergies since your discharge?: No Dietary orders reviewed?: Yes Type of Diet Ordered:: Low Na, heart healthy Do you have support at home?: Yes People in Home: parent(s) Name of Support/Comfort Primary Source: [name]  Medications Reviewed Today: Medications Reviewed Today     Reviewed by Jodelle Gross, RN (Case Manager) on 12/17/22 at 1259  Med List Status: <None>   Medication Order Taking? Sig Documenting Provider Last Dose Status Informant  apixaban (ELIQUIS) 5 MG TABS tablet [id number] Yes Take 5 mg by mouth 2 (two) times daily. [provider] Taking Active Self, Pharmacy Records  atorvastatin (LIPITOR) 40 MG tablet [id number] Yes Take 1 tablet (40 mg total) by mouth daily. [name], MD Taking Active Self, Pharmacy Records           Med Note Loa Socks   WNU Dec 06, 2022 12:49 PM) LF 07/30/22 for 90 DS. Pt is adamant she is still taking this medication daily. Dispense report does not support this claim.   B Complex-C-Zn-Folic Acid (DIALYVITE/ZINC) TABS [id number] Yes Take 1 tablet by mouth daily. [provider] Taking Active Self, Pharmacy Records           Med Note [name], [place] A   Thu May 15, 2022  9:18 AM)    Calcium Carb-Cholecalciferol (CALCIUM 600 + D PO)  [id number] Yes Take 2 tablets by mouth daily. [provider] Taking Active Self, Pharmacy Records  ferric citrate (AURYXIA) 1 GM 210 MG(Fe) tablet [id number] Yes Take 630 mg by mouth in the morning, at noon, and at bedtime. [provider] Taking Active Self, Pharmacy Records           Med Note [name], Marcy Siren   IRJ Dec 06, 2022 12:50 PM) Pt stated he has been receiving this medication from his mail order pharmacy   metroNIDAZOLE (FLAGYL) 500 MG tablet [id number] Yes Take 1 tablet (500 mg total) by mouth every 12 (twelve) hours for 24 days. [name], MD Taking Active   midodrine (PROAMATINE) 5 MG tablet [id number] Yes Take 3 tablets (15 mg total) by mouth every 8 (eight) hours. [name], MD Taking Active   oxybutynin (DITROPAN) 5 MG tablet [id number] Yes Take 1 tablet (5 mg total) by mouth 3 (three) times daily. [name], MD Taking Active   oxyCODONE (OXY IR/ROXICODONE) 5 MG immediate release tablet [id number] Yes Take 1 tablet (5 mg total) by mouth every 6 (six) hours as needed for up to 5 days for moderate pain or severe pain. [name], MD Taking Active   vancomycin [facility]) 1250 MG/250ML SOLN [id number]  Inject 250 mLs (1,250 mg total) into the vein Every Tuesday,Thursday,and Saturday with dialysis for 22 days. [name], MD  Active            Med Note [name] [facility]   Wed Dec 17, 2022 12:59 PM) To be administered at dialysis  Home Care and Equipment/Supplies: Were Home Health Services Ordered?: Yes Name of Home Health Agency:: [facility] Has Agency set up a time to come to your home?: No EMR reviewed for Home Health Orders: Orders present/patient has not received call (refer to CM for follow-up) Any new equipment or medical supplies ordered?: No  Functional Questionnaire: Do you need assistance with bathing/showering or dressing?: No Do you need assistance with meal preparation?: No Do you need assistance with eating?:  No Do you have difficulty maintaining continence: No Do you need assistance with getting out of bed/getting out of a chair/moving?: No Do you have difficulty managing or taking your medications?: No  Follow up appointments reviewed: PCP Follow-up appointment confirmed?: NA Specialist Hospital Follow-up appointment confirmed?: Yes Date of Specialist follow-up appointment?: 01/13/23 Follow-Up Specialty Provider:: Dr. Luciana Axe Do you need transportation to your follow-up appointment?: No Do you understand care options if your condition(s) worsen?: Yes-patient verbalized understanding  SDOH Interventions Today    Flowsheet Row Most Recent Value  SDOH Interventions   Food Insecurity Interventions Intervention Not Indicated  Housing Interventions Intervention Not Indicated  Transportation Interventions Intervention Not Indicated      TOC Interventions Today    Flowsheet Row Most Recent Value  TOC Interventions   TOC Interventions Discussed/Reviewed TOC Interventions Discussed, TOC Interventions Reviewed  [Notified [name], RNCC of patient discharge as she is following patient]       Jodelle Gross, RN, BSN, CCM Care Management Coordinator [place] Phone: [phone]/Fax: [phone]

## 2022-12-17 NOTE — Telephone Encounter (Signed)
Notified VIA phone that we haven't seen him in over a year.  Per provider they should contact the surgeon for signing off on orders.    He was d/c from hospital yesterday and told [facility] that he had and appointment on 7/11 & 7/12 and wanted to wiat to start home health till 12/22/22.

## 2022-12-17 NOTE — Telephone Encounter (Signed)
Transition of care contact from inpatient facility  Date of Discharge: 12/16/2022 Date of Contact: 12/17/2022 Method of contact: Phone  Attempted to contact patient to discuss transition of care from inpatient admission. Patient did not answer the phone. Message was left on the patient's voicemail with call back number [phone].  

## 2022-12-17 NOTE — Telephone Encounter (Signed)
[name] from [facility] [phone] said they can not start his procedure until 7.15.24

## 2022-12-18 DIAGNOSIS — Z992 Dependence on renal dialysis: Secondary | ICD-10-CM | POA: Diagnosis not present

## 2022-12-18 DIAGNOSIS — N186 End stage renal disease: Secondary | ICD-10-CM | POA: Diagnosis not present

## 2022-12-19 DIAGNOSIS — N2581 Secondary hyperparathyroidism of renal origin: Secondary | ICD-10-CM | POA: Diagnosis not present

## 2022-12-19 DIAGNOSIS — Z992 Dependence on renal dialysis: Secondary | ICD-10-CM | POA: Diagnosis not present

## 2022-12-19 DIAGNOSIS — N186 End stage renal disease: Secondary | ICD-10-CM | POA: Diagnosis not present

## 2022-12-20 DIAGNOSIS — Z992 Dependence on renal dialysis: Secondary | ICD-10-CM | POA: Diagnosis not present

## 2022-12-20 DIAGNOSIS — N186 End stage renal disease: Secondary | ICD-10-CM | POA: Diagnosis not present

## 2022-12-20 DIAGNOSIS — N2581 Secondary hyperparathyroidism of renal origin: Secondary | ICD-10-CM | POA: Diagnosis not present

## 2022-12-23 DIAGNOSIS — Z992 Dependence on renal dialysis: Secondary | ICD-10-CM | POA: Diagnosis not present

## 2022-12-23 DIAGNOSIS — N186 End stage renal disease: Secondary | ICD-10-CM | POA: Diagnosis not present

## 2022-12-23 DIAGNOSIS — T8249XA Other complication of vascular dialysis catheter, initial encounter: Secondary | ICD-10-CM | POA: Diagnosis not present

## 2022-12-23 DIAGNOSIS — N2581 Secondary hyperparathyroidism of renal origin: Secondary | ICD-10-CM | POA: Diagnosis not present

## 2022-12-24 DIAGNOSIS — Z992 Dependence on renal dialysis: Secondary | ICD-10-CM | POA: Diagnosis not present

## 2022-12-24 DIAGNOSIS — N2581 Secondary hyperparathyroidism of renal origin: Secondary | ICD-10-CM | POA: Diagnosis not present

## 2022-12-24 DIAGNOSIS — N186 End stage renal disease: Secondary | ICD-10-CM | POA: Diagnosis not present

## 2022-12-25 DIAGNOSIS — N186 End stage renal disease: Secondary | ICD-10-CM | POA: Diagnosis not present

## 2022-12-25 DIAGNOSIS — Z992 Dependence on renal dialysis: Secondary | ICD-10-CM | POA: Diagnosis not present

## 2022-12-25 DIAGNOSIS — N2581 Secondary hyperparathyroidism of renal origin: Secondary | ICD-10-CM | POA: Diagnosis not present

## 2022-12-26 ENCOUNTER — Telehealth: Payer: Self-pay

## 2022-12-26 NOTE — Patient Outreach (Signed)
  Care Coordination   12/26/2022 Name: [name] MRN: [id number] DOB: [birth year]   Care Coordination Outreach Attempts:  An unsuccessful telephone outreach was attempted today to offer the patient information about available care coordination services.  Follow Up Plan:  Additional outreach attempts will be made to offer the patient care coordination information and services.   Encounter Outcome:  No Answer   Care Coordination Interventions:  No, not indicated    Bary Leriche, RN, MSN [facility] Care Management Care Management Coordinator Direct Line [phone]

## 2022-12-27 DIAGNOSIS — N186 End stage renal disease: Secondary | ICD-10-CM | POA: Diagnosis not present

## 2022-12-27 DIAGNOSIS — Z992 Dependence on renal dialysis: Secondary | ICD-10-CM | POA: Diagnosis not present

## 2022-12-27 DIAGNOSIS — N2581 Secondary hyperparathyroidism of renal origin: Secondary | ICD-10-CM | POA: Diagnosis not present

## 2022-12-29 ENCOUNTER — Ambulatory Visit (INDEPENDENT_AMBULATORY_CARE_PROVIDER_SITE_OTHER): Payer: Medicare HMO

## 2022-12-29 VITALS — Ht 74.0 in | Wt 319.7 lb

## 2022-12-29 DIAGNOSIS — Z0000 Encounter for general adult medical examination without abnormal findings: Secondary | ICD-10-CM

## 2022-12-29 NOTE — Patient Instructions (Signed)
[name] , Thank you for taking time to come for your Medicare Wellness Visit. I appreciate your ongoing commitment to your health goals. Please review the following plan we discussed and let me know if I can assist you in the future.   These are the goals we discussed:  Goals      Health Maintainance     Care Coordination Interventions: Evaluation of current treatment plan related to ESRD management and patient's adherence to plan as established by provider Discussed plans with patient for ongoing care management follow up and provided patient with direct contact information for care management team  Patient reports doing okay. Continuing with hemodialysis.  No concerns.          Patient Stated     12/29/2022, denies any goals at this time        This is a list of the screening recommended for you and due dates:  Health Maintenance  Topic Date Due   Hepatitis C Screening  Never done   DTaP/Tdap/Td vaccine (1 - Tdap) Never done   Flu Shot  01/08/2023   Hemoglobin A1C  06/07/2023   Medicare Annual Wellness Visit  12/29/2023   Colon Cancer Screening  05/20/2032   HIV Screening  Completed   HPV Vaccine  Aged Out   Complete foot exam   Discontinued   Eye exam for diabetics  Discontinued   COVID-19 Vaccine  Discontinued    Advanced directives: Advance directive discussed with you today.   Conditions/risks identified: none  Next appointment: Follow up in one year for your annual wellness visit   Preventive Care 40-64 Years, Male Preventive care refers to lifestyle choices and visits with your health care provider that can promote health and wellness. What does preventive care include? A yearly physical exam. This is also called an annual well check. Dental exams once or twice a year. Routine eye exams. Ask your health care provider how often you should have your eyes checked. Personal lifestyle choices, including: Daily care of your teeth and gums. Regular physical  activity. Eating a healthy diet. Avoiding tobacco and drug use. Limiting alcohol use. Practicing safe sex. Taking low-dose aspirin every day starting at age 30. What happens during an annual well check? The services and screenings done by your health care provider during your annual well check will depend on your age, overall health, lifestyle risk factors, and family history of disease. Counseling  Your health care provider may ask you questions about your: Alcohol use. Tobacco use. Drug use. Emotional well-being. Home and relationship well-being. Sexual activity. Eating habits. Work and work Astron[employer]. Screening  You may have the following tests or measurements: Height, weight, and BMI. Blood pressure. Lipid and cholesterol levels. These may be checked every 5 years, or more frequently if you are over 48 years old. Skin check. Lung cancer screening. You may have this screening every year starting at age 78 if you have a 30-pack-year history of smoking and currently smoke or have quit within the past 15 years. Fecal occult blood test (FOBT) of the stool. You may have this test every year starting at age 106. Flexible sigmoidoscopy or colonoscopy. You may have a sigmoidoscopy every 5 years or a colonoscopy every 10 years starting at age 39. Prostate cancer screening. Recommendations will vary depending on your family history and other risks. Hepatitis C blood test. Hepatitis B blood test. Sexually transmitted disease (STD) testing. Diabetes screening. This is done by checking your blood sugar (glucose) after  you have not eaten for a while (fasting). You may have this done every 1-3 years. Discuss your test results, treatment options, and if necessary, the need for more tests with your health care provider. Vaccines  Your health care provider may recommend certain vaccines, such as: Influenza vaccine. This is recommended every year. Tetanus, diphtheria, and acellular pertussis  (Tdap, Td) vaccine. You may need a Td booster every 10 years. Zoster vaccine. You may need this after age 70. Pneumococcal 13-valent conjugate (PCV13) vaccine. You may need this if you have certain conditions and have not been vaccinated. Pneumococcal polysaccharide (PPSV23) vaccine. You may need one or two doses if you smoke cigarettes or if you have certain conditions. Talk to your health care provider about which screenings and vaccines you need and how often you need them. This information is not intended to replace advice given to you by your health care provider. Make sure you discuss any questions you have with your health care provider. Document Released: 06/22/2015 Document Revised: 02/13/2016 Document Reviewed: 03/27/2015 Elsevier Interactive Patient Education  2017 [employer].  Fall Prevention in the Home Falls can cause injuries. They can happen to people of all ages. There are many things you can do to make your home safe and to help prevent falls. What can I do on the outside of my home? Regularly fix the edges of walkways and driveways and fix any cracks. Remove anything that might make you trip as you walk through a door, such as a raised step or threshold. Trim any bushes or trees on the path to your home. Use bright outdoor lighting. Clear any walking paths of anything that might make someone trip, such as rocks or tools. Regularly check to see if handrails are loose or broken. Make sure that both sides of any steps have handrails. Any raised decks and porches should have guardrails on the edges. Have any leaves, snow, or ice cleared regularly. Use sand or salt on walking paths during winter. Clean up any spills in your garage right away. This includes oil or grease spills. What can I do in the bathroom? Use night lights. Install grab bars by the toilet and in the tub and shower. Do not use towel bars as grab bars. Use non-skid mats or decals in the tub or shower. If  you need to sit down in the shower, use a plastic, non-slip stool. Keep the floor dry. Clean up any water that spills on the floor as soon as it happens. Remove soap buildup in the tub or shower regularly. Attach bath mats securely with double-sided non-slip rug tape. Do not have throw rugs and other things on the floor that can make you trip. What can I do in the bedroom? Use night lights. Make sure that you have a light by your bed that is easy to reach. Do not use any sheets or blankets that are too big for your bed. They should not hang down onto the floor. Have a firm chair that has side arms. You can use this for support while you get dressed. Do not have throw rugs and other things on the floor that can make you trip. What can I do in the kitchen? Clean up any spills right away. Avoid walking on wet floors. Keep items that you use a lot in easy-to-reach places. If you need to reach something above you, use a strong step stool that has a grab bar. Keep electrical cords out of the way. Do not  use floor polish or wax that makes floors slippery. If you must use wax, use non-skid floor wax. Do not have throw rugs and other things on the floor that can make you trip. What can I do with my stairs? Do not leave any items on the stairs. Make sure that there are handrails on both sides of the stairs and use them. Fix handrails that are broken or loose. Make sure that handrails are as long as the stairways. Check any carpeting to make sure that it is firmly attached to the stairs. Fix any carpet that is loose or worn. Avoid having throw rugs at the top or bottom of the stairs. If you do have throw rugs, attach them to the floor with carpet tape. Make sure that you have a light switch at the top of the stairs and the bottom of the stairs. If you do not have them, ask someone to add them for you. What else can I do to help prevent falls? Wear shoes that: Do not have high heels. Have rubber  bottoms. Are comfortable and fit you well. Are closed at the toe. Do not wear sandals. If you use a stepladder: Make sure that it is fully opened. Do not climb a closed stepladder. Make sure that both sides of the stepladder are locked into place. Ask someone to hold it for you, if possible. Clearly mark and make sure that you can see: Any grab bars or handrails. First and last steps. Where the edge of each step is. Use tools that help you move around (mobility aids) if they are needed. These include: Canes. Walkers. Scooters. Crutches. Turn on the lights when you go into a dark area. Replace any light bulbs as soon as they burn out. Set up your furniture so you have a clear path. Avoid moving your furniture around. If any of your floors are uneven, fix them. If there are any pets around you, be aware of where they are. Review your medicines with your doctor. Some medicines can make you feel dizzy. This can increase your chance of falling. Ask your doctor what other things that you can do to help prevent falls. This information is not intended to replace advice given to you by your health care provider. Make sure you discuss any questions you have with your health care provider. Document Released: 03/22/2009 Document Revised: 11/01/2015 Document Reviewed: 06/30/2014 Elsevier Interactive Patient Education  2017 [employer].

## 2022-12-29 NOTE — Progress Notes (Signed)
Subjective:   [name] is a 47 y.o. male who presents for Medicare Annual/Subsequent preventive examination.  Visit Complete: Virtual  I connected with  [name] on 12/29/22 by a audio enabled telemedicine application and verified that I am speaking with the correct person using two identifiers.  Patient Location: Home  Provider Location: Office/Clinic  I discussed the limitations of evaluation and management by telemedicine. The patient expressed understanding and agreed to proceed.  Per patient no change in vitals since last visit, unable to obtain new vitals due to telehealth visit  Review of Systems     Cardiac Risk Factors include: advanced age (>3men, >59 women);dyslipidemia;hypertension;male gender;obesity (BMI >30kg/m2)     Objective:    Today's Vitals   12/29/22 1425  Weight: (!) 319 lb 10.7 oz (145 kg)  Height: 6\' 2"  (1.88 m)   Body mass index is 41.04 kg/m.     12/29/2022    2:31 PM 12/06/2022   10:52 AM 08/08/2022    5:56 AM 06/13/2022    7:56 AM 05/20/2022    7:26 AM 04/23/2022    7:26 AM 03/06/2022    8:12 AM  Advanced Directives  Does Patient Have a Medical Advance Directive? No No No No No No No  Would patient like information on creating a medical advance directive?  No - Patient declined No - Patient declined No - Patient declined No - Patient declined No - Patient declined No - Patient declined    Current Medications (verified) Outpatient Encounter Medications as of 12/29/2022  Medication Sig   apixaban (ELIQUIS) 5 MG TABS tablet Take 5 mg by mouth 2 (two) times daily.   atorvastatin (LIPITOR) 40 MG tablet Take 1 tablet (40 mg total) by mouth daily.   B Complex-C-Zn-Folic Acid (DIALYVITE/ZINC) TABS Take 1 tablet by mouth daily.   Calcium Carb-Cholecalciferol (CALCIUM 600 + D PO) Take 2 tablets by mouth daily.   ferric citrate (AURYXIA) 1 GM 210 MG(Fe) tablet Take 630 mg by mouth in the morning, at noon, and at bedtime.    metroNIDAZOLE (FLAGYL) 500 MG tablet Take 1 tablet (500 mg total) by mouth every 12 (twelve) hours for 24 days.   midodrine (PROAMATINE) 5 MG tablet Take 3 tablets (15 mg total) by mouth every 8 (eight) hours.   oxybutynin (DITROPAN) 5 MG tablet Take 1 tablet (5 mg total) by mouth 3 (three) times daily.   vancomycin (VANCOREADY) 1250 MG/250ML SOLN Inject 250 mLs (1,250 mg total) into the vein Every Tuesday,Thursday,and Saturday with dialysis for 22 days.   No facility-administered encounter medications on file as of 12/29/2022.    Allergies (verified) Patient has no known allergies.   History: Past Medical History:  Diagnosis Date   Acute respiratory failure with hypoxia (HCC) 11/30/2018   Anemia    ESRD   ESRD on hemodialysis (HCC) 06/07/2011   TTS [place]. Started HD in 2006, got transplant in 2014 lasted until Dec 2019 then went back on HD.  Has L thigh AVG as of Jun 2020.  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    History of blood transfusion    Hypertension    03/19/22- has not had high blood pressure in 5 years.   Morbid obesity (HCC)    Paroxysmal atrial fibrillation (HCC)    Pre-diabetes    no meds   Sepsis (HCC) 11/30/2018   Sleep apnea    lost weight, does not use CPAP   Past Surgical History:  Procedure Laterality Date   AMPUTATION Right 12/12/2022   Procedure: AMPUTATION RAY;  Surgeon: Louann Sjogren, DPM;  Location: [facility] OR;  Service: Orthopedics/Podiatry;  Laterality: Right;  surgical team to do local block   ARTERIOVENOUS GRAFT PLACEMENT  08/09/2010   Left Thigh Graft by Dr. Cari Caraway   AV FISTULA PLACEMENT Right 05/18/2018   Procedure: INSERTION OF ARTERIOVENOUS (AV) GORE-TEX GRAFT Left THIGH;  Surgeon: Maeola Harman, MD;  Location: [facility] OR;  Service: Vascular;  Laterality: Right;   AV FISTULA PLACEMENT Right 02/15/2021   Procedure: INSERTION OF ARTERIOVENOUS (AV) GORE-TEX LOOP GRAFT RIGHT THIGH;  Surgeon: Maeola Harman, MD;   Location: [facility] OR;  Service: Vascular;  Laterality: Right;   BIOPSY  05/20/2022   Procedure: BIOPSY;  Surgeon: Shellia Cleverly, DO;  Location: [facility];  Service: Gastroenterology;;   CAPD INSERTION N/A 06/13/2022   Procedure: LAPAROSCOPIC INSERTION CONTINUOUS AMBULATORY PERITONEAL DIALYSIS  (CAPD) CATHETER;  Surgeon: Maeola Harman, MD;  Location: [facility] OR;  Service: Vascular;  Laterality: N/A;   CAPD REMOVAL  05/08/2011   Procedure: CONTINUOUS AMBULATORY PERITONEAL DIALYSIS  (CAPD) CATHETER REMOVAL;  Surgeon: Iona Coach, MD;  Location: [facility] OR;  Service: General;  Laterality: N/A;  Removal of CAPD catheter, Dr. requests to go after 100   CAPD REMOVAL N/A 08/08/2022   Procedure: REMOVAL CONTINUOUS AMBULATORY PERITONEAL DIALYSIS  (CAPD) CATHETER;  Surgeon: Maeola Harman, MD;  Location: [facility] OR;  Service: Vascular;  Laterality: N/A;   COLONOSCOPY WITH PROPOFOL N/A 05/20/2022   Procedure: COLONOSCOPY WITH PROPOFOL;  Surgeon: Shellia Cleverly, DO;  Location: [facility];  Service: Gastroenterology;  Laterality: N/A;   I & D EXTREMITY Right 12/08/2022   Procedure: IRRIGATION AND DEBRIDEMENT RIGHT FOOT;  Surgeon: Louann Sjogren, DPM;  Location: [facility] OR;  Service: Orthopedics/Podiatry;  Laterality: Right;   INSERTION OF DIALYSIS CATHETER  10/05/2010   Right Femoral Cath insertion by Dr. Leonides Sake.  Pt ahas had several caths inserted.   INSERTION OF DIALYSIS CATHETER Right 02/15/2021   Procedure: Attempted INSERTION OF RIGHT and Left Internal Jugular DIALYSIS CATHETER, Insertion of Left Femoral Vein Dialysis Catheter;  Surgeon: Maeola Harman, MD;  Location: [facility] OR;  Service: Vascular;  Laterality: Right;   INSERTION OF DIALYSIS CATHETER Right 04/26/2021   Procedure: INSERTION OF TUNNELED 55cm PALIDROME PRECISION CHRONIC DIALYSIS CATHETER;  Surgeon: Leonie Douglas, MD;  Location: [facility] OR;  Service: Vascular;  Laterality: Right;   INSERTION OF DIALYSIS CATHETER Left  12/26/2021   Procedure: INSERTION OF TUNNELED  DIALYSIS CATHETER LEFT FEMORAL ARTERY;  Surgeon: Maeola Harman, MD;  Location: [facility] OR;  Service: Vascular;  Laterality: Left;   INSERTION OF DIALYSIS CATHETER Left 03/06/2022   Procedure: INSERTION OF DIALYSIS CATHETER;  Surgeon: Maeola Harman, MD;  Location: [facility] OR;  Service: Vascular;  Laterality: Left;   IR FLUORO GUIDE CV LINE LEFT  04/23/2022   IR FLUORO GUIDE CV LINE RIGHT  05/11/2018   IR FLUORO GUIDE CV LINE RIGHT  07/03/2021   IR FLUORO GUIDE CV LINE RIGHT  07/16/2021   IR PTA ADDL CENTRAL DIALYSIS SEG THRU DIALY CIRCUIT RIGHT Right 07/03/2021   IR US GUIDE VASC ACCESS LEFT  04/23/2022   IR US GUIDE VASC ACCESS RIGHT  05/11/2018   IR VENO/EXT/UNI LEFT  04/23/2022   IR VENOCAVAGRAM IVC  07/16/2021   KIDNEY TRANSPLANT  2014   KNEE ARTHROSCOPY Left    LAPAROSCOPIC LYSIS OF ADHESIONS N/A 06/13/2022   Procedure: LAPAROSCOPIC LYSIS  OF ADHESIONS;  Surgeon: Maeola Harman, MD;  Location: [facility] OR;  Service: Vascular;  Laterality: N/A;   POLYPECTOMY  05/20/2022   Procedure: POLYPECTOMY;  Surgeon: Shellia Cleverly, DO;  Location: [facility];  Service: Gastroenterology;;   THROMBECTOMY W/ EMBOLECTOMY Right 04/26/2021   Procedure: REMOVAL OF RIGHT THIGH ARTERIOVENOUS GORE-TEX GRAFT AND REPAIR OF RIGHT COMMON FEMORAL ARTERY;  Surgeon: Leonie Douglas, MD;  Location: [facility] OR;  Service: Vascular;  Laterality: Right;   UPPER EXTREMITY ANGIOGRAPHY Bilateral 05/13/2018   Procedure: UPPER EXTREMITY ANGIOGRAPHY - bilarteral;  Surgeon: Cephus Shelling, MD;  Location: [facility];  Service: Cardiovascular;  Laterality: Bilateral;   Family History  Problem Relation Age of Onset   Diabetes Father    Stroke Father    Stroke Maternal Grandmother    Anesthesia problems Neg Hx    Social History   Socioeconomic History   Marital status: Single    Spouse name: Not on file   Number of children: 0   Years of education:  Not on file   Highest education level: Not on file  Occupational History   Not on file  Tobacco Use   Smoking status: Former    Current packs/day: 0.00    Types: Cigarettes    Quit date: 11/08/2015    Years since quitting: 7.1   Smokeless tobacco: Never  Vaping Use   Vaping status: Never Used  Substance and Sexual Activity   Alcohol use: Not Currently   Drug use: Not Currently    Types: Marijuana    Comment: Last use was in 04/2022   Sexual activity: Yes  Other Topics Concern   Not on file  Social History Narrative   Lives alone.    Social Determinants of Health   Financial Resource Strain: Low Risk  (12/29/2022)   Overall Financial Resource Strain (CARDIA)    Difficulty of Paying Living Expenses: Not hard at all  Food Insecurity: No Food Insecurity (12/29/2022)   Hunger Vital Sign    Worried About Running Out of Food in the Last Year: Never true    Ran Out of Food in the Last Year: Never true  Transportation Needs: No Transportation Needs (12/29/2022)   PRAPARE - Administrator, Civil Service (Medical): No    Lack of Transportation (Non-Medical): No  Physical Activity: Insufficiently Active (12/29/2022)   Exercise Vital Sign    Days of Exercise per Week: 3 days    Minutes of Exercise per Session: 30 min  Stress: No Stress Concern Present (12/29/2022)   [employer] of Occupational Health - Occupational Stress Questionnaire    Feeling of Stress : Not at all  Social Connections: Socially Isolated (12/29/2022)   Social Connection and Isolation Panel [NHANES]    Frequency of Communication with Friends and Family: More than three times a week    Frequency of Social Gatherings with Friends and Family: More than three times a week    Attends Religious Services: Never    Database administrator or Organizations: No    Attends Engineer, structural: Never    Marital Status: Never married    Tobacco Counseling Counseling given: Not Answered   Clinical  Intake:  Pre-visit preparation completed: Yes  Pain : No/denies pain     Nutritional Status: BMI > 30  Obese Nutritional Risks: None Diabetes: No  How often do you need to have someone help you when you read instructions, pamphlets, or other written materials from  your doctor or pharmacy?: 1 - Never  Interpreter Needed?: No  Information entered by :: NAllen LPN   Activities of Daily Living    12/29/2022    2:27 PM 08/08/2022    5:46 AM  In your present state of health, do you have any difficulty performing the following activities:  Hearing? 0   Vision? 0   Difficulty concentrating or making decisions? 0   Walking or climbing stairs? 0   Dressing or bathing? 0   Doing errands, shopping? 0 0  Preparing Food and eating ? N   Using the Toilet? N   In the past six months, have you accidently leaked urine? N   Do you have problems with loss of bowel control? N   Managing your Medications? N   Managing your Finances? N   Housekeeping or managing your Housekeeping? N     Patient Care Team: [name], MD as PCP - General (Family Medicine) Thurmon Fair, MD as PCP - Cardiology (Cardiology) Lannie Fields, MD (Internal Medicine) [facility], Dennard Schaumann, MD as Consulting Physician (Vascular Surgery) Croitoru, Rachelle Hora, MD as Consulting Physician (Cardiology) [name], RN as [facility] Care Management  Indicate any recent Medical Services you may have received from other than [facility] providers in the past year (date may be approximate).     Assessment:   This is a routine wellness examination for B[place].  Hearing/Vision screen Hearing Screening - Comments:: Denies hearing issues Vision Screening - Comments:: No regular eye exams  Dietary issues and exercise activities discussed:     Goals Addressed             This Visit's Progress    Patient Stated       12/29/2022, denies any goals at this time        Depression Screen    12/29/2022    2:32 PM 09/15/2022   10:02 AM 12/31/2021   12:03 PM 08/14/2021    2:21 PM 08/14/2021    2:19 PM 12/05/2020    9:36 AM 01/25/2018    8:30 AM  PHQ 2/9 Scores  PHQ - 2 Score 0 0 0 0 0 0 0  PHQ- 9 Score 1          Fall Risk    12/29/2022    2:31 PM 12/31/2021   12:03 PM 08/14/2021    2:21 PM 04/11/2019    9:10 AM 01/25/2018    8:30 AM  Fall Risk   Falls in the past year? 0 0 0 0 No  Number falls in past yr: 0  0    Injury with Fall? 0  0    Risk for fall due to : Medication side effect  No Fall Risks    Follow up Falls prevention discussed;Falls evaluation completed  Falls evaluation completed      MEDICARE RISK AT HOME:  Medicare Risk at Home - 12/29/22 1432     Any stairs in or around the home? Yes    If so, are there any without handrails? No    Home free of loose throw rugs in walkways, pet beds, electrical cords, etc? Yes    Adequate lighting in your home to reduce risk of falls? Yes    Life alert? No    Use of a cane, walker or w/c? No    Grab bars in the bathroom? No    Shower chair or bench in shower? No    Elevated toilet seat  or a handicapped toilet? No             TIMED UP AND GO:  Was the test performed?  No    Cognitive Function:        12/29/2022    2:33 PM  6CIT Screen  What Year? 0 points  What month? 0 points  What time? 0 points  Count back from 20 0 points  Months in reverse 0 points  Repeat phrase 2 points  Total Score 2 points    Immunizations Immunization History  Administered Date(s) Administered   Influenza Split 03/10/2011   Influenza, Seasonal, Injecte, Preservative Fre 03/17/2013   Influenza,inj,Quad PF,6+ Mos 04/20/2014   Influenza-Unspecified 03/22/2012   Pneumococcal Conjugate-13 04/20/2014   Pneumococcal Polysaccharide-23 04/08/2012    TDAP status: Up to date  Flu Vaccine status: Declined, Education has been provided regarding the importance of this vaccine but patient still declined.  Advised may receive this vaccine at local pharmacy or Health Dept. Aware to provide a copy of the vaccination record if obtained from local pharmacy or Health Dept. Verbalized acceptance and understanding.  Pneumococcal vaccine status: Up to date  Covid-19 vaccine status: Information provided on how to obtain vaccines.   Qualifies for Shingles Vaccine? No   Zostavax completed  n/a   Shingrix Completed?: n/a  Screening Tests Health Maintenance  Topic Date Due   Hepatitis C Screening  Never done   DTaP/Tdap/Td (1 - Tdap) Never done   INFLUENZA VACCINE  01/08/2023   HEMOGLOBIN A1C  06/07/2023   Medicare Annual Wellness (AWV)  12/29/2023   Colonoscopy  05/20/2032   HIV Screening  Completed   HPV VACCINES  Aged Out   FOOT EXAM  Discontinued   OPHTHALMOLOGY EXAM  Discontinued   COVID-19 Vaccine  Discontinued    Health Maintenance  Health Maintenance Due  Topic Date Due   Hepatitis C Screening  Never done   DTaP/Tdap/Td (1 - Tdap) Never done    Colorectal cancer screening: Type of screening: Colonoscopy. Completed 05/20/2022. Repeat every 10 years  Lung Cancer Screening: (Low Dose CT Chest recommended if Age 44-80 years, 20 pack-year currently smoking OR have quit w/in 15years.) does not qualify.   Lung Cancer Screening Referral: no  Additional Screening:  Hepatitis C Screening: does qualify;   Vision Screening: Recommended annual ophthalmology exams for early detection of glaucoma and other disorders of the eye. Is the patient up to date with their annual eye exam?  No  Who is the provider or what is the name of the office in which the patient attends annual eye exams? none If pt is not established with a provider, would they like to be referred to a provider to establish care? No .   Dental Screening: Recommended annual dental exams for proper oral hygiene  Diabetic Foot Exam: n/a  Community Resource Referral / Chronic Care Management: CRR required this visit?  No    CCM required this visit?  No     Plan:     I have personally reviewed and noted the following in the patient's chart:   Medical and social history Use of alcohol, tobacco or illicit drugs  Current medications and supplements including opioid prescriptions. Patient is not currently taking opioid prescriptions. Functional ability and status Nutritional status Physical activity Advanced directives List of other physicians Hospitalizations, surgeries, and ER visits in previous 12 months Vitals Screenings to include cognitive, depression, and falls Referrals and appointments  In addition, I have reviewed and discussed  with patient certain preventive protocols, quality metrics, and best practice recommendations. A written personalized care plan for preventive services as well as general preventive health recommendations were provided to patient.     Barb Merino, LPN   [id number]   After Visit Summary: (MyChart) Due to this being a telephonic visit, the after visit summary with patients personalized plan was offered to patient via MyChart   Nurse Notes: none

## 2022-12-30 DIAGNOSIS — N2581 Secondary hyperparathyroidism of renal origin: Secondary | ICD-10-CM | POA: Diagnosis not present

## 2022-12-30 DIAGNOSIS — Z992 Dependence on renal dialysis: Secondary | ICD-10-CM | POA: Diagnosis not present

## 2022-12-30 DIAGNOSIS — N186 End stage renal disease: Secondary | ICD-10-CM | POA: Diagnosis not present

## 2022-12-31 ENCOUNTER — Encounter (HOSPITAL_COMMUNITY): Payer: Self-pay | Admitting: Internal Medicine

## 2022-12-31 ENCOUNTER — Observation Stay (HOSPITAL_BASED_OUTPATIENT_CLINIC_OR_DEPARTMENT_OTHER): Payer: Medicare HMO | Admitting: Anesthesiology

## 2022-12-31 ENCOUNTER — Other Ambulatory Visit: Payer: Self-pay

## 2022-12-31 ENCOUNTER — Telehealth: Payer: Self-pay

## 2022-12-31 ENCOUNTER — Encounter (HOSPITAL_COMMUNITY)
Admission: EM | Disposition: A | Discharge status: Home / Self Care | Payer: Self-pay | Source: Home / Self Care | Attending: Emergency Medicine

## 2022-12-31 ENCOUNTER — Observation Stay (HOSPITAL_COMMUNITY): Payer: Medicare HMO

## 2022-12-31 ENCOUNTER — Observation Stay (HOSPITAL_COMMUNITY): Payer: Medicare HMO | Admitting: Anesthesiology

## 2022-12-31 ENCOUNTER — Observation Stay (HOSPITAL_COMMUNITY)
Admission: EM | Admit: 2022-12-31 | Discharge: 2023-01-01 | Disposition: A | Discharge status: Home / Self Care | Payer: Medicare HMO | Attending: Internal Medicine | Admitting: Internal Medicine

## 2022-12-31 DIAGNOSIS — I130 Hypertensive heart and chronic kidney disease with heart failure and stage 1 through stage 4 chronic kidney disease, or unspecified chronic kidney disease: Secondary | ICD-10-CM | POA: Diagnosis not present

## 2022-12-31 DIAGNOSIS — N186 End stage renal disease: Secondary | ICD-10-CM

## 2022-12-31 DIAGNOSIS — Z7901 Long term (current) use of anticoagulants: Secondary | ICD-10-CM | POA: Insufficient documentation

## 2022-12-31 DIAGNOSIS — I5032 Chronic diastolic (congestive) heart failure: Secondary | ICD-10-CM | POA: Insufficient documentation

## 2022-12-31 DIAGNOSIS — I10 Essential (primary) hypertension: Secondary | ICD-10-CM | POA: Diagnosis present

## 2022-12-31 DIAGNOSIS — E875 Hyperkalemia: Secondary | ICD-10-CM | POA: Diagnosis not present

## 2022-12-31 DIAGNOSIS — T8241XA Breakdown (mechanical) of vascular dialysis catheter, initial encounter: Secondary | ICD-10-CM | POA: Diagnosis not present

## 2022-12-31 DIAGNOSIS — I132 Hypertensive heart and chronic kidney disease with heart failure and with stage 5 chronic kidney disease, or end stage renal disease: Secondary | ICD-10-CM | POA: Insufficient documentation

## 2022-12-31 DIAGNOSIS — I509 Heart failure, unspecified: Secondary | ICD-10-CM

## 2022-12-31 DIAGNOSIS — I480 Paroxysmal atrial fibrillation: Secondary | ICD-10-CM | POA: Diagnosis not present

## 2022-12-31 DIAGNOSIS — I120 Hypertensive chronic kidney disease with stage 5 chronic kidney disease or end stage renal disease: Secondary | ICD-10-CM | POA: Diagnosis not present

## 2022-12-31 DIAGNOSIS — Z79899 Other long term (current) drug therapy: Secondary | ICD-10-CM | POA: Diagnosis not present

## 2022-12-31 DIAGNOSIS — Z87891 Personal history of nicotine dependence: Secondary | ICD-10-CM

## 2022-12-31 DIAGNOSIS — T82898A Other specified complication of vascular prosthetic devices, implants and grafts, initial encounter: Secondary | ICD-10-CM

## 2022-12-31 DIAGNOSIS — Z992 Dependence on renal dialysis: Secondary | ICD-10-CM

## 2022-12-31 DIAGNOSIS — Y828 Other medical devices associated with adverse incidents: Secondary | ICD-10-CM | POA: Diagnosis not present

## 2022-12-31 HISTORY — PX: INSERTION OF DIALYSIS CATHETER: SHX1324

## 2022-12-31 LAB — CBC WITH DIFFERENTIAL/PLATELET
Abs Immature Granulocytes: 0.02 10*3/uL (ref 0.00–0.07)
Basophils Absolute: 0.1 10*3/uL (ref 0.0–0.1)
Basophils Relative: 1 %
Eosinophils Absolute: 0.5 10*3/uL (ref 0.0–0.5)
Eosinophils Relative: 9 %
HCT: 28.3 % — ABNORMAL LOW (ref 39.0–52.0)
Hemoglobin: 8.9 g/dL — ABNORMAL LOW (ref 13.0–17.0)
Immature Granulocytes: 0 %
Lymphocytes Relative: 18 %
Lymphs Abs: 1 10*3/uL (ref 0.7–4.0)
MCH: 29.6 pg (ref 26.0–34.0)
MCHC: 31.4 g/dL (ref 30.0–36.0)
MCV: 94 fL (ref 80.0–100.0)
Monocytes Absolute: 0.8 10*3/uL (ref 0.1–1.0)
Monocytes Relative: 14 %
Neutro Abs: 3.2 10*3/uL (ref 1.7–7.7)
Neutrophils Relative %: 58 %
Platelets: 262 10*3/uL (ref 150–400)
RBC: 3.01 MIL/uL — ABNORMAL LOW (ref 4.22–5.81)
RDW: 18.4 % — ABNORMAL HIGH (ref 11.5–15.5)
WBC: 5.6 10*3/uL (ref 4.0–10.5)
nRBC: 0 % (ref 0.0–0.2)

## 2022-12-31 LAB — COMPREHENSIVE METABOLIC PANEL
ALT: 9 U/L (ref 0–44)
AST: 11 U/L — ABNORMAL LOW (ref 15–41)
Albumin: 3.6 g/dL (ref 3.5–5.0)
Alkaline Phosphatase: 56 U/L (ref 38–126)
Anion gap: 15 (ref 5–15)
BUN: 91 mg/dL — ABNORMAL HIGH (ref 6–20)
CO2: 25 mmol/L (ref 22–32)
Calcium: 9.6 mg/dL (ref 8.9–10.3)
Chloride: 96 mmol/L — ABNORMAL LOW (ref 98–111)
Creatinine, Ser: 24.7 mg/dL — ABNORMAL HIGH (ref 0.61–1.24)
GFR, Estimated: 2 mL/min — ABNORMAL LOW (ref >60)
Glucose, Bld: 93 mg/dL (ref 70–99)
Potassium: 6.6 mmol/L (ref 3.5–5.1)
Sodium: 136 mmol/L (ref 135–145)
Total Bilirubin: 0.7 mg/dL (ref 0.3–1.2)
Total Protein: 7.5 g/dL (ref 6.5–8.1)

## 2022-12-31 LAB — POCT I-STAT, CHEM 8
BUN: 83 mg/dL — ABNORMAL HIGH (ref 6–20)
Calcium, Ion: 1.12 mmol/L — ABNORMAL LOW (ref 1.15–1.40)
Chloride: 99 mmol/L (ref 98–111)
Creatinine, Ser: >18 mg/dL — ABNORMAL HIGH (ref 0.61–1.24)
Glucose, Bld: 91 mg/dL (ref 70–99)
HCT: 33 % — ABNORMAL LOW (ref 39.0–52.0)
Hemoglobin: 11.2 g/dL — ABNORMAL LOW (ref 13.0–17.0)
Potassium: 5.6 mmol/L — ABNORMAL HIGH (ref 3.5–5.1)
Sodium: 137 mmol/L (ref 135–145)
TCO2: 27 mmol/L (ref 22–32)

## 2022-12-31 LAB — I-STAT CHEM 8, ED
BUN: 86 mg/dL — ABNORMAL HIGH (ref 6–20)
Calcium, Ion: 1.1 mmol/L — ABNORMAL LOW (ref 1.15–1.40)
Chloride: 98 mmol/L (ref 98–111)
Creatinine, Ser: >18 mg/dL — ABNORMAL HIGH (ref 0.61–1.24)
Glucose, Bld: 93 mg/dL (ref 70–99)
HCT: 28 % — ABNORMAL LOW (ref 39.0–52.0)
Hemoglobin: 9.5 g/dL — ABNORMAL LOW (ref 13.0–17.0)
Potassium: 6.6 mmol/L (ref 3.5–5.1)
Sodium: 135 mmol/L (ref 135–145)
TCO2: 26 mmol/L (ref 22–32)

## 2022-12-31 LAB — HEPATITIS B SURFACE ANTIGEN: Hepatitis B Surface Ag: NONREACTIVE

## 2022-12-31 LAB — APTT: aPTT: 42 seconds — ABNORMAL HIGH (ref 24–36)

## 2022-12-31 LAB — HEPARIN LEVEL (UNFRACTIONATED): Heparin Unfractionated: >1.1 IU/mL — ABNORMAL HIGH (ref 0.30–0.70)

## 2022-12-31 LAB — VANCOMYCIN, RANDOM: Vancomycin Rm: 28 ug/mL

## 2022-12-31 SURGERY — INSERTION OF DIALYSIS CATHETER
Anesthesia: General | Site: Groin | Laterality: Left

## 2022-12-31 MED ORDER — METRONIDAZOLE 500 MG PO TABS
500.0000 mg | ORAL_TABLET | Freq: Two times a day (BID) | ORAL | Status: DC
  Administered 2022-12-31 – 2023-01-01 (×2): 500 mg via ORAL
  Filled 2022-12-31 (×2): qty 1

## 2022-12-31 MED ORDER — OXYBUTYNIN CHLORIDE 5 MG PO TABS
5.0000 mg | ORAL_TABLET | Freq: Three times a day (TID) | ORAL | Status: DC
  Administered 2023-01-01: 5 mg via ORAL
  Filled 2022-12-31 (×4): qty 1

## 2022-12-31 MED ORDER — FENTANYL CITRATE (PF) 250 MCG/5ML IJ SOLN
INTRAMUSCULAR | Status: DC | PRN
  Administered 2022-12-31 (×3): 50 ug via INTRAVENOUS

## 2022-12-31 MED ORDER — HEPARIN SODIUM (PORCINE) 1000 UNIT/ML IJ SOLN
INTRAMUSCULAR | Status: DC | PRN
  Administered 2022-12-31: 4.2 [IU]

## 2022-12-31 MED ORDER — INSULIN ASPART 100 UNIT/ML IV SOLN
5.0000 [IU] | Freq: Once | INTRAVENOUS | Status: AC
  Administered 2022-12-31: 5 [IU] via INTRAVENOUS

## 2022-12-31 MED ORDER — FENTANYL CITRATE (PF) 250 MCG/5ML IJ SOLN
INTRAMUSCULAR | Status: AC
  Filled 2022-12-31: qty 5

## 2022-12-31 MED ORDER — PROPOFOL 10 MG/ML IV BOLUS
INTRAVENOUS | Status: AC
  Filled 2022-12-31: qty 20

## 2022-12-31 MED ORDER — FERRIC CITRATE 1 GM 210 MG(FE) PO TABS
630.0000 mg | ORAL_TABLET | Freq: Three times a day (TID) | ORAL | Status: DC
  Administered 2023-01-01 (×2): 630 mg via ORAL
  Filled 2022-12-31 (×3): qty 3

## 2022-12-31 MED ORDER — PROPOFOL 10 MG/ML IV BOLUS
INTRAVENOUS | Status: DC | PRN
  Administered 2022-12-31: 200 mg via INTRAVENOUS

## 2022-12-31 MED ORDER — CALCIUM GLUCONATE-NACL 1-0.675 GM/50ML-% IV SOLN
1.0000 g | Freq: Once | INTRAVENOUS | Status: AC
  Administered 2022-12-31: 1000 mg via INTRAVENOUS
  Filled 2022-12-31: qty 50

## 2022-12-31 MED ORDER — VANCOMYCIN VARIABLE DOSE PER UNSTABLE RENAL FUNCTION (PHARMACIST DOSING): Status: DC

## 2022-12-31 MED ORDER — ALBUTEROL SULFATE (2.5 MG/3ML) 0.083% IN NEBU
INHALATION_SOLUTION | RESPIRATORY_TRACT | Status: AC
  Filled 2022-12-31: qty 9

## 2022-12-31 MED ORDER — HEPARIN SODIUM (PORCINE) 1000 UNIT/ML IJ SOLN
INTRAMUSCULAR | Status: AC
  Filled 2022-12-31: qty 10

## 2022-12-31 MED ORDER — MIDODRINE HCL 5 MG PO TABS
15.0000 mg | ORAL_TABLET | Freq: Three times a day (TID) | ORAL | Status: DC
  Administered 2022-12-31 – 2023-01-01 (×3): 15 mg via ORAL
  Filled 2022-12-31 (×3): qty 3

## 2022-12-31 MED ORDER — MIDAZOLAM HCL 5 MG/5ML IJ SOLN
INTRAMUSCULAR | Status: DC | PRN
  Administered 2022-12-31: 2 mg via INTRAVENOUS

## 2022-12-31 MED ORDER — SODIUM BICARBONATE 8.4 % IV SOLN
50.0000 meq | Freq: Once | INTRAVENOUS | Status: AC
  Administered 2022-12-31: 50 meq via INTRAVENOUS
  Filled 2022-12-31: qty 50

## 2022-12-31 MED ORDER — PHENYLEPHRINE HCL (PRESSORS) 10 MG/ML IV SOLN
INTRAVENOUS | Status: DC | PRN
  Administered 2022-12-31: 40 ug via INTRAVENOUS
  Administered 2022-12-31 (×2): 80 ug via INTRAVENOUS

## 2022-12-31 MED ORDER — HEPARIN SODIUM (PORCINE) 1000 UNIT/ML DIALYSIS
2500.0000 [IU] | INTRAMUSCULAR | Status: DC | PRN
  Filled 2022-12-31: qty 3

## 2022-12-31 MED ORDER — DEXTROSE 50 % IV SOLN
1.0000 | Freq: Once | INTRAVENOUS | Status: AC
  Administered 2022-12-31: 50 mL via INTRAVENOUS
  Filled 2022-12-31: qty 50

## 2022-12-31 MED ORDER — CHLORHEXIDINE GLUCONATE CLOTH 2 % EX PADS
6.0000 | MEDICATED_PAD | Freq: Every day | CUTANEOUS | Status: DC
  Administered 2023-01-01: 6 via TOPICAL

## 2022-12-31 MED ORDER — IODIXANOL 320 MG/ML IV SOLN
INTRAVENOUS | Status: DC | PRN
  Administered 2022-12-31: 28 mL

## 2022-12-31 MED ORDER — MIDAZOLAM HCL 2 MG/2ML IJ SOLN
INTRAMUSCULAR | Status: AC
  Filled 2022-12-31: qty 2

## 2022-12-31 MED ORDER — LIDOCAINE HCL (CARDIAC) PF 100 MG/5ML IV SOSY
PREFILLED_SYRINGE | INTRAVENOUS | Status: DC | PRN
  Administered 2022-12-31: 60 mg via INTRATRACHEAL

## 2022-12-31 MED ORDER — PHENYLEPHRINE HCL-NACL 20-0.9 MG/250ML-% IV SOLN
INTRAVENOUS | Status: DC | PRN
  Administered 2022-12-31: 40 ug/min via INTRAVENOUS

## 2022-12-31 MED ORDER — LIDOCAINE HCL (PF) 1 % IJ SOLN
INTRAMUSCULAR | Status: AC
  Filled 2022-12-31: qty 30

## 2022-12-31 MED ORDER — HEPARIN (PORCINE) 25000 UT/250ML-% IV SOLN
1600.0000 [IU]/h | INTRAVENOUS | Status: DC
  Administered 2022-12-31: 1600 [IU]/h via INTRAVENOUS
  Filled 2022-12-31: qty 250

## 2022-12-31 MED ORDER — 0.9 % SODIUM CHLORIDE (POUR BTL) OPTIME
TOPICAL | Status: DC | PRN
  Administered 2022-12-31: 1000 mL

## 2022-12-31 MED ORDER — ALBUTEROL SULFATE (2.5 MG/3ML) 0.083% IN NEBU
10.0000 mg | INHALATION_SOLUTION | Freq: Once | RESPIRATORY_TRACT | Status: AC
  Administered 2022-12-31: 10 mg via RESPIRATORY_TRACT
  Filled 2022-12-31: qty 12

## 2022-12-31 MED ORDER — SODIUM ZIRCONIUM CYCLOSILICATE 10 G PO PACK
10.0000 g | PACK | Freq: Three times a day (TID) | ORAL | Status: AC
  Administered 2022-12-31: 10 g via ORAL
  Filled 2022-12-31: qty 1

## 2022-12-31 MED ORDER — EPHEDRINE SULFATE (PRESSORS) 50 MG/ML IJ SOLN
INTRAMUSCULAR | Status: DC | PRN
  Administered 2022-12-31: 5 mg via INTRAVENOUS

## 2022-12-31 MED ORDER — ACETAMINOPHEN 500 MG PO TABS
1000.0000 mg | ORAL_TABLET | Freq: Once | ORAL | Status: AC
  Administered 2022-12-31: 1000 mg via ORAL
  Filled 2022-12-31: qty 2

## 2022-12-31 MED ORDER — HEPARIN 6000 UNIT IRRIGATION SOLUTION
Status: DC | PRN
  Administered 2022-12-31: 1

## 2022-12-31 MED ORDER — ATORVASTATIN CALCIUM 40 MG PO TABS
40.0000 mg | ORAL_TABLET | Freq: Every day | ORAL | Status: DC
  Administered 2023-01-01: 40 mg via ORAL
  Filled 2022-12-31: qty 1

## 2022-12-31 MED ORDER — HEPARIN SODIUM (PORCINE) 5000 UNIT/ML IJ SOLN: 5000.0000 [IU] | Freq: Two times a day (BID) | INTRAMUSCULAR | Status: DC

## 2022-12-31 MED ORDER — VANCOMYCIN HCL 2000 MG/400ML IV SOLN
2000.0000 mg | Freq: Once | INTRAVENOUS | Status: DC
  Filled 2022-12-31: qty 400

## 2022-12-31 MED ORDER — CEFAZOLIN SODIUM-DEXTROSE 2-3 GM-%(50ML) IV SOLR
INTRAVENOUS | Status: DC | PRN
  Administered 2022-12-31: 2 g via INTRAVENOUS

## 2022-12-31 MED ORDER — SODIUM CHLORIDE 0.9 % IV SOLN: INTRAVENOUS | Status: DC | PRN

## 2022-12-31 SURGICAL SUPPLY — 41 items
APL SKNCLS STERI-STRIP NONHPOA (GAUZE/BANDAGES/DRESSINGS) ×1
BAG DECANTER FOR FLEXI CONT (MISCELLANEOUS) ×2 IMPLANT
BENZOIN TINCTURE PRP APPL 2/3 (GAUZE/BANDAGES/DRESSINGS) ×2 IMPLANT
BIOPATCH RED 1 DISK 7.0 (GAUZE/BANDAGES/DRESSINGS) ×2 IMPLANT
CATH PALINDROME-P 19CM W/VT (CATHETERS) IMPLANT
CATH PALINDROME-P 23CM W/VT (CATHETERS) IMPLANT
CATH PALINDROME-P 28CM W/VT (CATHETERS) IMPLANT
COVER PROBE W GEL 5X96 (DRAPES) ×2 IMPLANT
COVER SURGICAL LIGHT HANDLE (MISCELLANEOUS) ×2 IMPLANT
DRAPE C-ARM 42X72 X-RAY (DRAPES) ×2 IMPLANT
DRAPE CHEST BREAST 15X10 FENES (DRAPES) ×2 IMPLANT
GAUZE 4X4 16PLY ~~LOC~~+RFID DBL (SPONGE) ×2 IMPLANT
GAUZE SPONGE 4X4 12PLY STRL (GAUZE/BANDAGES/DRESSINGS) ×2 IMPLANT
GLOVE BIO SURGEON STRL SZ8 (GLOVE) ×2 IMPLANT
GOWN STRL REUS W/ TWL LRG LVL3 (GOWN DISPOSABLE) ×2 IMPLANT
GOWN STRL REUS W/ TWL XL LVL3 (GOWN DISPOSABLE) ×2 IMPLANT
GOWN STRL REUS W/TWL LRG LVL3 (GOWN DISPOSABLE) ×1
GOWN STRL REUS W/TWL XL LVL3 (GOWN DISPOSABLE) ×1
KIT BASIN OR (CUSTOM PROCEDURE TRAY) ×2 IMPLANT
KIT PALINDROME-P 55CM (CATHETERS) IMPLANT
KIT TURNOVER KIT B (KITS) ×2 IMPLANT
NDL 18GX1X1/2 (RX/OR ONLY) (NEEDLE) ×2 IMPLANT
NDL HYPO 25GX1X1/2 BEV (NEEDLE) ×2 IMPLANT
NEEDLE 18GX1X1/2 (RX/OR ONLY) (NEEDLE) ×1 IMPLANT
NEEDLE HYPO 25GX1X1/2 BEV (NEEDLE) ×1 IMPLANT
NS IRRIG 1000ML POUR BTL (IV SOLUTION) ×2 IMPLANT
PACK BASIC III (CUSTOM PROCEDURE TRAY) ×1
PACK SRG BSC III STRL LF ECLPS (CUSTOM PROCEDURE TRAY) ×2 IMPLANT
PAD ARMBOARD 7.5X6 YLW CONV (MISCELLANEOUS) ×4 IMPLANT
SET MICROPUNCTURE 5F STIFF (MISCELLANEOUS) ×2 IMPLANT
SOAP 2 % CHG 4 OZ (WOUND CARE) ×2 IMPLANT
STRIP CLOSURE SKIN 1/2X4 (GAUZE/BANDAGES/DRESSINGS) ×2 IMPLANT
SUT ETHILON 3 0 PS 1 (SUTURE) ×2 IMPLANT
SUT MNCRL AB 4-0 PS2 18 (SUTURE) ×2 IMPLANT
SYR 10ML LL (SYRINGE) ×2 IMPLANT
SYR 20ML LL LF (SYRINGE) ×4 IMPLANT
SYR 5ML LL (SYRINGE) ×2 IMPLANT
SYR CONTROL 10ML LL (SYRINGE) ×2 IMPLANT
TOWEL GREEN STERILE (TOWEL DISPOSABLE) ×2 IMPLANT
TOWEL GREEN STERILE FF (TOWEL DISPOSABLE) ×4 IMPLANT
WATER STERILE IRR 1000ML POUR (IV SOLUTION) ×2 IMPLANT

## 2022-12-31 NOTE — Consult Note (Signed)
Renal Service Consult Note [facility]  [facility] 12/31/2022 Maree Krabbe, MD Requesting Physician: Dr. Dalene Seltzer  Reason for Consult: ESRD pt w/ [facility] failure HPI: The patient is a 47 y.o. year-old w/ PMH as below who presented to ED this am reporting his HD catheter is clogged and hasn't had dialysis since partial HD last Thursday (7/18). Last full HD was 7/16. In ED K+ 6.6, VSS, pt not in distress. We are asked to see pt for dialysis.   Pt has a complicated access history.  He has used up all his sites for fistulas/ grafts and is catheter dependent in the groin. Pt seen in ED. Denies SOB, cough, orthopnea. Mild leg swelling. Says he has had catheters placed by [facility], [facility] and [facility].  Last HD cath was done on 12/23/22 at [facility].    [facility] has a policy of not putting in another [facility] within 10 days from the prior placement.   ROS - denies CP, no joint pain, no HA, no blurry vision, no rash, no diarrhea, no nausea/ vomiting, no dysuria, no difficulty voiding   Past Medical History  Past Medical History:  Diagnosis Date   Acute respiratory failure with hypoxia (HCC) 11/30/2018   Anemia    ESRD   ESRD on hemodialysis (HCC) 06/07/2011   TTS [place] [facility]. Started HD in 2006, got transplant in 2014 lasted until Dec 2019 then went back on HD.  Has L thigh AVG as of Jun 2020.  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    History of blood transfusion    Hypertension    03/19/22- has not had high blood pressure in 5 years.   Morbid obesity (HCC)    Paroxysmal atrial fibrillation (HCC)    Pre-diabetes    no meds   Sepsis (HCC) 11/30/2018   Sleep apnea    lost weight, does not use CPAP   Past Surgical History  Past Surgical History:  Procedure Laterality Date   AMPUTATION Right 12/12/2022   Procedure: AMPUTATION RAY;  Surgeon: Louann Sjogren, DPM;  Location: [facility] OR;  Service: Orthopedics/Podiatry;  Laterality: Right;  surgical team to do local block    ARTERIOVENOUS GRAFT PLACEMENT  08/09/2010   Left Thigh Graft by Dr. Cari Caraway   AV FISTULA PLACEMENT Right 05/18/2018   Procedure: INSERTION OF ARTERIOVENOUS (AV) GORE-TEX GRAFT Left THIGH;  Surgeon: Maeola Harman, MD;  Location: [facility] OR;  Service: Vascular;  Laterality: Right;   AV FISTULA PLACEMENT Right 02/15/2021   Procedure: INSERTION OF ARTERIOVENOUS (AV) GORE-TEX LOOP GRAFT RIGHT THIGH;  Surgeon: Maeola Harman, MD;  Location: [facility] OR;  Service: Vascular;  Laterality: Right;   BIOPSY  05/20/2022   Procedure: BIOPSY;  Surgeon: Shellia Cleverly, DO;  Location: [facility];  Service: Gastroenterology;;   CAPD INSERTION N/A 06/13/2022   Procedure: LAPAROSCOPIC INSERTION CONTINUOUS AMBULATORY PERITONEAL DIALYSIS  (CAPD) CATHETER;  Surgeon: Maeola Harman, MD;  Location: [facility] OR;  Service: Vascular;  Laterality: N/A;   CAPD REMOVAL  05/08/2011   Procedure: CONTINUOUS AMBULATORY PERITONEAL DIALYSIS  (CAPD) CATHETER REMOVAL;  Surgeon: Iona Coach, MD;  Location: [facility] OR;  Service: General;  Laterality: N/A;  Removal of CAPD catheter, Dr. requests to go after 100   CAPD REMOVAL N/A 08/08/2022   Procedure: REMOVAL CONTINUOUS AMBULATORY PERITONEAL DIALYSIS  (CAPD) CATHETER;  Surgeon: Maeola Harman, MD;  Location: [facility] OR;  Service: Vascular;  Laterality: N/A;   COLONOSCOPY WITH PROPOFOL  N/A 05/20/2022   Procedure: COLONOSCOPY WITH PROPOFOL;  Surgeon: Shellia Cleverly, DO;  Location: [facility];  Service: Gastroenterology;  Laterality: N/A;   I & D EXTREMITY Right 12/08/2022   Procedure: IRRIGATION AND DEBRIDEMENT RIGHT FOOT;  Surgeon: Louann Sjogren, DPM;  Location: [facility] OR;  Service: Orthopedics/Podiatry;  Laterality: Right;   INSERTION OF DIALYSIS CATHETER  10/05/2010   Right Femoral Cath insertion by Dr. Leonides Sake.  Pt ahas had several caths inserted.   INSERTION OF DIALYSIS CATHETER Right 02/15/2021   Procedure: Attempted INSERTION OF RIGHT and Left  Internal Jugular DIALYSIS CATHETER, Insertion of Left Femoral Vein Dialysis Catheter;  Surgeon: Maeola Harman, MD;  Location[facility] OR;  Service: Vascular;  Laterality: Right;   INSERTION OF DIALYSIS CATHETER Right 04/26/2021   Procedure: INSERTION OF TUNNELED 55cm PALIDROME PRECISION CHRONIC DIALYSIS CATHETER;  Surgeon: Leonie Douglas, MD;  Location: [facility] OR;  Service: Vascular;  Laterality: Right;   INSERTION OF DIALYSIS CATHETER Left 12/26/2021   Procedure: INSERTION OF TUNNELED  DIALYSIS CATHETER LEFT FEMORAL ARTERY;  Surgeon: Maeola Harman, MD;  Location: [facility] OR;  Service: Vascular;  Laterality: Left;   INSERTION OF DIALYSIS CATHETER Left 03/06/2022   Procedure: INSERTION OF DIALYSIS CATHETER;  Surgeon: Maeola Harman, MD;  Location: [facility] OR;  Service: Vascular;  Laterality: Left;   IR FLUORO GUIDE CV LINE LEFT  04/23/2022   IR FLUORO GUIDE CV LINE RIGHT  05/11/2018   IR FLUORO GUIDE CV LINE RIGHT  07/03/2021   IR FLUORO GUIDE CV LINE RIGHT  07/16/2021   IR PTA ADDL CENTRAL DIALYSIS SEG THRU DIALY CIRCUIT RIGHT Right 07/03/2021   IR US GUIDE VASC ACCESS LEFT  04/23/2022   IR US GUIDE [facility]ASC ACCESS RIGHT  05/11/2018   IR VENO/EXT/UNI LEFT  04/23/2022   IR VENOCAVAGRAM IVC  07/16/2021   KIDNEY TRANSPLANT  2014   KNEE ARTHROSCOPY Left    LAPAROSCOPIC LYSIS OF ADHESIONS N/A 06/13/2022   Procedure: LAPAROSCOPIC LYSIS OF ADHESIONS;  Surgeon: Maeola Harman, MD;  Location: [facility] OR;  Service: Vascular;  Laterality: N/A;   POLYPECTOMY  05/20/2022   Procedure: POLYPECTOMY;  Surgeon: Shellia Cleverly, DO;  Location: [facility];  Service: Gastroenterology;;   THROMBECTOMY W/ EMBOLECTOMY Right 04/26/2021   Procedure: REMOVAL OF RIGHT THIGH ARTERIOVENOUS GORE-TEX GRAFT AND REPAIR OF RIGHT COMMON FEMORAL ARTERY;  Surgeon: Leonie Douglas, MD;  Location: [facility] OR;  Service: Vascular;  Laterality: Right;   UPPER EXTREMITY ANGIOGRAPHY Bilateral 05/13/2018   Procedure: UPPER  EXTREMITY ANGIOGRAPHY - bilarteral;  Surgeon: Cephus Shelling, MD;  [facility];  Service: Cardiovascular;  Laterality: Bilateral;   Family History  Family History  Problem Relation Age of Onset   Diabetes Father    Stroke Father    Stroke Maternal Grandmother    Anesthesia problems Neg Hx    Social History  reports that he quit smoking about 7 years ago. His smoking use included cigarettes. He has never used smokeless tobacco. He reports that he does not currently use alcohol. He reports that he does not currently use drugs after having used the following drugs: Marijuana. Allergies No Known Allergies Home medications Prior to Admission medications   Medication Sig Start Date End Date Taking? Authorizing Provider  apixaban (ELIQUIS) 5 MG TABS tablet Take 5 mg by mouth 2 (two) times daily. 10/10/21   [provider]  atorvastatin (LIPITOR) 40 MG tablet Take 1 tablet (40 mg total) by mouth daily. 12/20/21  [name], MD  B Complex-C-Zn-Folic Acid (DIALYVITE/ZINC) TABS Take 1 tablet by mouth daily. 01/16/22   [provider]  Calcium Carb-Cholecalciferol (CALCIUM 600 + D PO) Take 2 tablets by mouth daily.    [provider]  ferric citrate (AURYXIA) 1 GM 210 MG(Fe) tablet Take 630 mg by mouth in the morning, at noon, and at bedtime. 09/06/19   [provider]  metroNIDAZOLE (FLAGYL) 500 MG tablet Take 1 tablet (500 mg total) by mouth every 12 (twelve) hours for 24 days. 12/16/22 01/09/23  [name], MD  midodrine (PROAMATINE) 5 MG tablet Take 3 tablets (15 mg total) by mouth every 8 (eight) hours. 12/16/22 01/15/23  [name], MD  oxybutynin (DITROPAN) 5 MG tablet Take 1 tablet (5 mg total) by mouth 3 (three) times daily. 12/16/22 01/15/23  [name], MD  vancomycin (VANCOREADY) 1250 MG/250ML SOLN Inject 250 mLs (1,250 mg total) into the vein Every Tuesday,Thursday,and Saturday with dialysis for 22 days. 12/18/22 01/09/23  [name], MD     Vitals:   12/31/22 1012  BP: 119/78  Pulse: 66  Resp: 20  Temp: 98.8 F (37.1 C)  TempSrc: Oral  SpO2: 100%  Weight: (!) 147 kg  Height: 6\' 2"  (1.88 m)   Exam Gen alert, no distress No rash, cyanosis or gangrene Sclera anicteric, throat clear  No jvd or bruits Chest clear bilat to bases, no rales/ wheezing RRR no MRG Abd soft ntnd no mass or ascites +bs GU normal  MS no joint effusions or deformity Ext trace pretib edema, no wounds or ulcers Neuro is alert, Ox 3 , nf    L fem TDC intact, clean exit site    Home meds include - apixaban, atorvastatin, b complex, calc+ D, ferric citrate 3 ac, midodrine 15mg  tid, oxybutynin   OP HD: SW TTS  4.5h   500/500   140kg   2/2 bath   L fem TDC    Heparin 5000 + 2500 midrun - parsabiv 7.5mg  IV three times per week - mircera 50 mcg IV q 2 wks, last  - vancomycin 1250mg  IV q hd through 01/08/23    Assessment/ Plan: Malfunctioning HD catheter - last full HD 7/16, creat > 18.0 here. Pt has complicated access history and has been followed by [facility] and [facility]. Pt needs admission and dialysis. ED consulting [facility] for access. Will plan HD when access re-established.  Hyperkalemia - K+  6.6 here. EKG w/o acute changes, QRS no change from baseline (90- 110 msec).  Will order temporizing measures now, and repeat K+ in 3-5 hrs. Then HD when access re-established.  ESRD - on HD TTS.  HD as above.  HTN/ volume - BP's wnl, not on any HTN meds at home. Pt says he is 7kg up, no SOB or swelling at this time. Max UF w/ HD here.  Anemia esrd - Hb 9.5, last esa was on 6/11. Consider esa while here.  MBD ckd - CCa is > 10.5, not getting any vdra or Ca containing binders. Add on phos, cont auryxia as binder while here.  PAF - on eliquis Chronic hypotension - cont midodrine 15 tid      Vinson Moselle  MD [facility] 12/31/2022, 12:59 PM

## 2022-12-31 NOTE — Anesthesia Procedure Notes (Signed)
Procedure Name: LMA Insertion Date/Time: 12/31/2022 9:43 PM  Performed by: Claudina Lick, CRNAPre-anesthesia Checklist: Patient identified, Emergency Drugs available, Suction available and Patient being monitored Patient Re-evaluated:Patient Re-evaluated prior to induction Oxygen Delivery Method: Circle system utilized Preoxygenation: Pre-oxygenation with 100% oxygen Induction Type: IV induction Ventilation: Mask ventilation without difficulty LMA: LMA inserted LMA Size: 5.0 Tube type: Oral Number of attempts: 1 Placement Confirmation: positive ETCO2 and breath sounds checked- equal and bilateral Tube secured with: Tape Dental Injury: Teeth and Oropharynx as per pre-operative assessment

## 2022-12-31 NOTE — Progress Notes (Signed)
ANTICOAGULATION CONSULT NOTE - Initial Consult  Pharmacy Consult for Heparin Indication: atrial fibrillation  No Known Allergies  Patient Measurements: Height: 6\' 2"  (188 cm) Weight: (!) 147 kg (324 lb 1.2 oz) IBW/kg (Calculated) : 82.2 Heparin Dosing Weight: 116 kg  Vital Signs: Temp: 97.5 F (36.4 C) (07/24 1448) Temp Source: Oral (07/24 1448) BP: 122/79 (07/24 1530) Pulse Rate: 93 (07/24 1530)  Labs: Recent Labs    12/31/22 1302 12/31/22 1326  HGB 8.9* 9.5*  HCT 28.3* 28.0*  PLT 262  --   CREATININE 24.70* >18.00*    CrCl cannot be calculated (This lab value cannot be used to calculate CrCl because it is not a number: >18.00).   Medical History: Past Medical History:  Diagnosis Date   Acute respiratory failure with hypoxia (HCC) 11/30/2018   Anemia    ESRD   ESRD on hemodialysis (HCC) 06/07/2011   TTS [facility]. Started HD in 2006, got transplant in 2014 lasted until Dec 2019 then went back on HD.  Has L thigh AVG as of Jun 2020.  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    History of blood transfusion    Hypertension    03/19/22- has not had high blood pressure in 5 years.   Morbid obesity (HCC)    Paroxysmal atrial fibrillation (HCC)    Pre-diabetes    no meds   Sepsis (HCC) 11/30/2018   Sleep apnea    lost weight, does not use CPAP    Medications:  (Not in a hospital admission)  Scheduled:   atorvastatin  40 mg Oral Daily   [START ON 01/01/2023] Chlorhexidine Gluconate Cloth  6 each Topical Q0600   ferric citrate  630 mg Oral TID WC   heparin  5,000 Units Subcutaneous Q12H   metroNIDAZOLE  500 mg Oral Q12H   midodrine  15 mg Oral Q8H   oxybutynin  5 mg Oral TID   sodium zirconium cyclosilicate  10 g Oral TID   Infusions:  PRN:   Assessment: 47 yom with a history of ESRD on HD TTS, AF on eliquis, carotid AV fistula, OSA, chronic rt foot osteo MRSA. Patient is presenting with left groin HD catheter problem and missed HD x 2. Heparin  per pharmacy consult placed for atrial fibrillation.  Patient is on apixaban prior to arrival. Last dose 7/24 0830. Will require aPTT monitoring due to likely falsely high anti-Xa level secondary to DOAC use.  Hgb 9.5; plt 262  Goal of Therapy:  Heparin level 0.3-0.7 units/ml aPTT 66-102 seconds Monitor platelets by anticoagulation protocol: Yes   Plan:  No initial heparin bolus Start heparin infusion at 1600 units/hr at 8:30pm tonight Check aPTT & anti-Xa level in 8 hours and daily while on heparin Continue to monitor via aPTT until levels are correlated Continue to monitor H&H and platelets  Delmar Landau, PharmD, BCPS 12/31/2022 3:37 PM ED Clinical Pharmacist -  [phone]

## 2022-12-31 NOTE — ED Notes (Signed)
ED TO INPATIENT HANDOFF REPORT  ED Nurse Name and Phone #: Delorse Lek [phone]  S Name/Age/Gender [name] 47 y.o. male Room/Bed: 020C/020C  Code Status   Code Status: Full Code  Home/SNF/Other Home Patient oriented to: self, place, time, and situation Is this baseline? Yes   Triage Complete: Triage complete  Chief Complaint Hyperkalemia [E87.5]  Triage Note Pt states dialysis catheter in left groin is clogged and hasn't worked since last Thursday. Pt states it was replaced last week and has only been used one since. Pt went to dialysis Saturday and Tuesday but was unable to get dialyzed.    Allergies No Known Allergies  Level of Care/Admitting Diagnosis ED Disposition     ED Disposition  Admit   Condition  --   Comment  Hospital Area: [facility] [[id number]]  Level of Care: Telemetry Medical [[id number]]  May place patient in observation at [facility] or [facility] if equivalent level of care is available:: No  Covid Evaluation: Asymptomatic - no recent exposure (last 10 days) testing not required  Diagnosis: Hyperkalemia [952841]  Admitting Physician: Emeline General [[id number]]  Attending Physician: Emeline General [[id number]]          B Medical/Surgery History Past Medical History:  Diagnosis Date   Acute respiratory failure with hypoxia (HCC) 11/30/2018   Anemia    ESRD   ESRD on hemodialysis (HCC) 06/07/2011   TTS [place]. Started HD in 2006, got transplant in 2014 lasted until Dec 2019 then went back on HD.  Has L thigh AVG as of Jun 2020.  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    History of blood transfusion    Hypertension    03/19/22- has not had high blood pressure in 5 years.   Morbid obesity (HCC)    Paroxysmal atrial fibrillation (HCC)    Pre-diabetes    no meds   Sepsis (HCC) 11/30/2018   Sleep apnea    lost weight, does not use CPAP   Past Surgical History:  Procedure Laterality Date   AMPUTATION  Right 12/12/2022   Procedure: AMPUTATION RAY;  Surgeon: Louann Sjogren, DPM;  Location: [facility] OR;  Service: Orthopedics/Podiatry;  Laterality: Right;  surgical team to do local block   ARTERIOVENOUS GRAFT PLACEMENT  08/09/2010   Left Thigh Graft by Dr. Cari Caraway   AV FISTULA PLACEMENT Right 05/18/2018   Procedure: INSERTION OF ARTERIOVENOUS (AV) GORE-TEX GRAFT Left THIGH;  Surgeon: Maeola Harman, MD;  Location: [facility] OR;  Service: Vascular;  Laterality: Right;   AV FISTULA PLACEMENT Right 02/15/2021   Procedure: INSERTION OF ARTERIOVENOUS (AV) GORE-TEX LOOP GRAFT RIGHT THIGH;  Surgeon: Maeola Harman, MD;  Location: [facility] OR;  Service: Vascular;  Laterality: Right;   BIOPSY  05/20/2022   Procedure: BIOPSY;  Surgeon: Shellia Cleverly, DO;  Location: [facility];  Service: Gastroenterology;;   CAPD INSERTION N/A 06/13/2022   Procedure: LAPAROSCOPIC INSERTION CONTINUOUS AMBULATORY PERITONEAL DIALYSIS  (CAPD) CATHETER;  Surgeon: Maeola Harman, MD;  Location: [facility] OR;  Service: Vascular;  Laterality: N/A;   CAPD REMOVAL  05/08/2011   Procedure: CONTINUOUS AMBULATORY PERITONEAL DIALYSIS  (CAPD) CATHETER REMOVAL;  Surgeon: Iona Coach, MD;  Location: [facility] OR;  Service: General;  Laterality: N/A;  Removal of CAPD catheter, Dr. requests to go after 100   CAPD REMOVAL N/A 08/08/2022   Procedure: REMOVAL CONTINUOUS AMBULATORY PERITONEAL DIALYSIS  (CAPD) CATHETER;  Surgeon: Maeola Harman, MD;  Location: [facility]  OR;  Service: Vascular;  Laterality: N/A;   COLONOSCOPY WITH PROPOFOL N/A 05/20/2022   Procedure: COLONOSCOPY WITH PROPOFOL;  Surgeon: Shellia Cleverly, DO;  Location: [facility];  Service: Gastroenterology;  Laterality: N/A;   I & D EXTREMITY Right 12/08/2022   Procedure: IRRIGATION AND DEBRIDEMENT RIGHT FOOT;  Surgeon: Louann Sjogren, DPM;  Location: [facility] OR;  Service: Orthopedics/Podiatry;  Laterality: Right;   INSERTION OF DIALYSIS CATHETER  10/05/2010    Right Femoral Cath insertion by Dr. Leonides Sake.  Pt ahas had several caths inserted.   INSERTION OF DIALYSIS CATHETER Right 02/15/2021   Procedure: Attempted INSERTION OF RIGHT and Left Internal Jugular DIALYSIS CATHETER, Insertion of Left Femoral Vein Dialysis Catheter;  Surgeon: Maeola Harman, MD;  Location: [facility] OR;  Service: Vascular;  Laterality: Right;   INSERTION OF DIALYSIS CATHETER Right 04/26/2021   Procedure: INSERTION OF TUNNELED 55cm PALIDROME PRECISION CHRONIC DIALYSIS CATHETER;  Surgeon: Leonie Douglas, MD;  Location: [facility] OR;  Service: Vascular;  Laterality: Right;   INSERTION OF DIALYSIS CATHETER Left 12/26/2021   Procedure: INSERTION OF TUNNELED  DIALYSIS CATHETER LEFT FEMORAL ARTERY;  Surgeon: Maeola Harman, MD;  Location: [facility] OR;  Service: Vascular;  Laterality: Left;   INSERTION OF DIALYSIS CATHETER Left 03/06/2022   Procedure: INSERTION OF DIALYSIS CATHETER;  Surgeon: Maeola Harman, MD;  Location: [facility] OR;  Service: Vascular;  Laterality: Left;   IR FLUORO GUIDE CV LINE LEFT  04/23/2022   IR FLUORO GUIDE CV LINE RIGHT  05/11/2018   IR FLUORO GUIDE CV LINE RIGHT  07/03/2021   IR FLUORO GUIDE CV LINE RIGHT  07/16/2021   IR PTA ADDL CENTRAL DIALYSIS SEG THRU DIALY CIRCUIT RIGHT Right 07/03/2021   IR US GUIDE VASC ACCESS LEFT  04/23/2022   IR US GUIDE VASC ACCESS RIGHT  05/11/2018   IR VENO/EXT/UNI LEFT  04/23/2022   IR VENOCAVAGRAM IVC  07/16/2021   KIDNEY TRANSPLANT  2014   KNEE ARTHROSCOPY Left    LAPAROSCOPIC LYSIS OF ADHESIONS N/A 06/13/2022   Procedure: LAPAROSCOPIC LYSIS OF ADHESIONS;  Surgeon: Maeola Harman, MD;  Location: [facility] OR;  Service: Vascular;  Laterality: N/A;   POLYPECTOMY  05/20/2022   Procedure: POLYPECTOMY;  Surgeon: Shellia Cleverly, DO;  Location: [facility];  Service: Gastroenterology;;   THROMBECTOMY W/ EMBOLECTOMY Right 04/26/2021   Procedure: REMOVAL OF RIGHT THIGH ARTERIOVENOUS GORE-TEX GRAFT AND REPAIR OF RIGHT  COMMON FEMORAL ARTERY;  Surgeon: Leonie Douglas, MD;  Location: [facility] OR;  Service: Vascular;  Laterality: Right;   UPPER EXTREMITY ANGIOGRAPHY Bilateral 05/13/2018   Procedure: UPPER EXTREMITY ANGIOGRAPHY - bilarteral;  Surgeon: Cephus Shelling, MD;  [facility]: [facility] CV LAB;  Service: Cardiovascular;  Laterality: Bilateral;     A IV Location/Drains/Wounds Patient Lines/Drains/Airways Status     Active Line/Drains/Airways     Name Placement date Placement time Site Days   Peripheral IV 12/31/22 20 G 1.88" Anterior;Right Forearm 12/31/22  1317  Forearm  less than 1   Fistula / Graft Left Thigh Arteriovenous vein graft 05/18/18  1044  Thigh  1688   Fistula / Graft Right Femoral vein Arteriovenous vein graft 02/15/21  1000  Femoral vein  684   Hemodialysis Catheter Left Femoral vein Double lumen Permanent (Tunneled) 12/06/22  1834  Femoral vein  25   Wound / Incision (Open or Dehisced) 12/06/22 Non-pressure wound Toe (Comment  which one) Anterior;Left 12/06/22  1626  Toe (Comment  which one)  25   Wound /  Incision (Open or Dehisced) 12/06/22 Foot Anterior;Right 12/06/22  1626  Foot  25            Intake/Output Last 24 hours No intake or output data in the 24 hours ending 12/31/22 1547  Labs/Imaging Results for orders placed or performed during the hospital encounter of 12/31/22 (from the past 48 hour(s))  CBC with Differential     Status: Abnormal   Collection Time: 12/31/22  1:02 PM  Result Value Ref Range   WBC 5.6 4.0 - 10.5 K/uL   RBC 3.01 (L) 4.22 - 5.81 MIL/uL   Hemoglobin 8.9 (L) 13.0 - 17.0 g/dL   HCT 16.1 (L) 09.6 - 04.5 %   MCV 94.0 80.0 - 100.0 fL   MCH 29.6 26.0 - 34.0 pg   MCHC 31.4 30.0 - 36.0 g/dL   RDW 40.9 (H) 81.1 - 91.4 %   Platelets 262 150 - 400 K/uL   nRBC 0.0 0.0 - 0.2 %   Neutrophils Relative % 58 %   Neutro Abs 3.2 1.7 - 7.7 K/uL   Lymphocytes Relative 18 %   Lymphs Abs 1.0 0.7 - 4.0 K/uL   Monocytes Relative 14 %   Monocytes Absolute 0.8  0.1 - 1.0 K/uL   Eosinophils Relative 9 %   Eosinophils Absolute 0.5 0.0 - 0.5 K/uL   Basophils Relative 1 %   Basophils Absolute 0.1 0.0 - 0.1 K/uL   Immature Granulocytes 0 %   Abs Immature Granulocytes 0.02 0.00 - 0.07 K/uL    Comment: Performed at [facility], [address], [place], [place] [address]  Comprehensive metabolic panel     Status: Abnormal   Collection Time: 12/31/22  1:02 PM  Result Value Ref Range   Sodium 136 135 - 145 mmol/L   Potassium 6.6 (HH) 3.5 - 5.1 mmol/L    Comment: CRITICAL RESULT CALLED TO, READ BACK BY AND VERIFIED WITH K Page Pucciarelli EMT P 7/724/2024 1434 BNUNNERY   Chloride 96 (L) 98 - 111 mmol/L   CO2 25 22 - 32 mmol/L   Glucose, Bld 93 70 - 99 mg/dL    Comment: Glucose reference range applies only to samples taken after fasting for at least 8 hours.   BUN 91 (H) 6 - 20 mg/dL   Creatinine, Ser 62.13 (H) 0.61 - 1.24 mg/dL   Calcium 9.6 8.9 - 08.6 mg/dL   Total Protein 7.5 6.5 - 8.1 g/dL   Albumin 3.6 3.5 - 5.0 g/dL   AST 11 (L) 15 - 41 U/L   ALT 9 0 - 44 U/L   Alkaline Phosphatase 56 38 - 126 U/L   Total Bilirubin 0.7 0.3 - 1.2 mg/dL   GFR, Estimated 2 (L) >60 mL/min    Comment: (NOTE) Calculated using the CKD-EPI Creatinine Equation (2021)    Anion gap 15 5 - 15    Comment: Performed at [facility], [address] [place], [place] [address]  I-stat chem 8, ED (not at [facility], [facility] or [facility])     Status: Abnormal   Collection Time: 12/31/22  1:26 PM  Result Value Ref Range   Sodium 135 135 - 145 mmol/L   Potassium 6.6 (HH) 3.5 - 5.1 mmol/L   Chloride 98 98 - 111 mmol/L   BUN 86 (H) 6 - 20 mg/dL   Creatinine, Ser >96.29 (H) 0.61 - 1.24 mg/dL   Glucose, Bld 93 70 - 99 mg/dL    Comment: Glucose reference range applies only  to samples taken after fasting for at least 8 hours.   Calcium, Ion 1.10 (L) 1.15 - 1.40 mmol/L   TCO2 26 22 - 32 mmol/L   Hemoglobin 9.5 (L) 13.0 - 17.0 g/dL   HCT 84.1 (L) 32.4 - 40.1 %   Comment NOTIFIED PHYSICIAN    Hepatitis B surface antigen     Status: None   Collection Time: 12/31/22  1:34 PM  Result Value Ref Range   Hepatitis B Surface Ag NON REACTIVE NON REACTIVE    Comment: Performed at [facility], [address] [place], [place] [address]   No results found.  Pending Labs Unresulted Labs (From admission, onward)     Start     Ordered   01/01/23 0500  Basic metabolic panel  Tomorrow morning,   R       Question:  Specimen collection method  Answer:  IV Team=IV Team collect   12/31/22 1518   12/31/22 1800  Potassium  STAT Now then every 4 hours ,   STAT       Comments: Until normal twice.   Question:  Specimen collection method  Answer:  IV Team=IV Team collect   12/31/22 1341   12/31/22 1536  Surgical pcr screen  Once,   R        12/31/22 1535   12/31/22 1334  Hepatitis B surface antibody,quantitative  (New Admission Hemo Labs (Hepatitis B))  Once,   URGENT       Question:  Specimen collection method  Answer:  IV Team=IV Team collect   12/31/22 1335            Vitals/Pain Today's Vitals   12/31/22 1352 12/31/22 1415 12/31/22 1448 12/31/22 1530  BP: 119/66 113/77  122/79  Pulse: (!) 53 (!) 50  93  Resp: 15 13  14   Temp:   (!) 97.5 F (36.4 C)   TempSrc:   Oral   SpO2: 100% 100%  97%  Weight:      Height:      PainSc:        Isolation Precautions No active isolations  Medications Medications  Chlorhexidine Gluconate Cloth 2 % PADS 6 each (has no administration in time range)  sodium zirconium cyclosilicate (LOKELMA) packet 10 g (has no administration in time range)  metroNIDAZOLE (FLAGYL) tablet 500 mg (has no administration in time range)  atorvastatin (LIPITOR) tablet 40 mg (has no administration in time range)  midodrine (PROAMATINE) tablet 15 mg (has no administration in time range)  ferric citrate (AURYXIA) tablet 630 mg (has no administration in time range)  oxybutynin (DITROPAN) tablet 5 mg (has no administration in time range)  calcium gluconate  1 g/ 50 mL sodium chloride IVPB (0 mg Intravenous Stopped 12/31/22 1454)  albuterol (PROVENTIL) (2.5 MG/3ML) 0.083% nebulizer solution 10 mg ( Nebulization Not Given 12/31/22 1456)  insulin aspart (novoLOG) injection 5 Units (5 Units Intravenous Given 12/31/22 1414)    And  dextrose 50 % solution 50 mL (50 mLs Intravenous Given 12/31/22 1418)  sodium bicarbonate injection 50 mEq (50 mEq Intravenous Given 12/31/22 1415)    Mobility walks     Focused Assessments    R Recommendations: See Admitting Provider Note  Report given to:   Additional Notes:

## 2022-12-31 NOTE — Plan of Care (Signed)
Patient came to floor on stretcher. Patient alert and oriented x4. Belongings at bedside. Patient has left  femoral cath in place. Soft around site. Patient has wound. Right great toe amputated and black at site. Skin dry and flaky. Assessed patient skin with RN Colon Branch. Placed patient on telemetry. Patient verbalized understanding of next steps in care.Patient denies any needs to be addressed at this time.

## 2022-12-31 NOTE — H&P (Signed)
History and Physical    [name] VQQ:[id number] DOB: May 21, 1976 DOA: 12/31/2022  PCP: [name], MD (Confirm with patient/family/NH records and if not entered, this has to be entered at [facility] point of entry) Patient coming from: Home  I have personally briefly reviewed patient's old medical records in [facility]  Chief Complaint: Can not do dialysis  HPI: [name] is a 47 y.o. male with medical history significant of ESRD on HD TTS via left femoral dialysis catheter, PAF and carotid AV fistula on Eliquis, OSA not on CPAP, chronic right foot osteomyelitis MRSA on IV and p.o. antibiotics, presented with left groin HD catheter problem and unable to get dialysis x 2.  Patient's last regular HD session on Thursday, Saturday, given to his regular dialysis, when it was found the left groin HD catheter was clogged and patient sent home without dialysis.  Same thing happened yesterday.  And patient to contact nephrology who sent him to ED this morning.  Denies any chest pain shortness of breath, no swelling in the legs.  ED Course: Borderline bradycardia, blood pressure SBP 119, blood work showed creatinine 24, BUN 86, K6.6.  EKG showed sinus bradycardia, first-degree AV block, no acute tented T wave  Review of Systems: As per HPI otherwise 14 point review of systems negative.    Past Medical History:  Diagnosis Date   Acute respiratory failure with hypoxia (HCC) 11/30/2018   Anemia    ESRD   ESRD on hemodialysis (HCC) 06/07/2011   TTS [place]. Started HD in 2006, got transplant in 2014 lasted until Dec 2019 then went back on HD.  Has L thigh AVG as of Jun 2020.  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    History of blood transfusion    Hypertension    03/19/22- has not had high blood pressure in 5 years.   Morbid obesity (HCC)    Paroxysmal atrial fibrillation (HCC)    Pre-diabetes    no meds   Sepsis (HCC) 11/30/2018   Sleep apnea    lost weight,  does not use CPAP    Past Surgical History:  Procedure Laterality Date   AMPUTATION Right 12/12/2022   Procedure: AMPUTATION RAY;  Surgeon: Louann Sjogren, DPM;  Location: [facility] OR;  Service: Orthopedics/Podiatry;  Laterality: Right;  surgical team to do local block   ARTERIOVENOUS GRAFT PLACEMENT  08/09/2010   Left Thigh Graft by Dr. Cari Caraway   AV FISTULA PLACEMENT Right 05/18/2018   Procedure: INSERTION OF ARTERIOVENOUS (AV) GORE-TEX GRAFT Left THIGH;  Surgeon: Maeola Harman, MD;  Location[facility] OR;  Service: Vascular;  Laterality: Right;   AV FISTULA PLACEMENT Right 02/15/2021   Procedure: INSERTION OF ARTERIOVENOUS (AV) GORE-TEX LOOP GRAFT RIGHT THIGH;  Surgeon: Maeola Harman, MD;  Location: [facility] OR;  Service: Vascular;  Laterality: Right;   BIOPSY  05/20/2022   Procedure: BIOPSY;  Surgeon: Shellia Cleverly, DO;  Location: [facility];  Service: Gastroenterology;;   CAPD INSERTION N/A 06/13/2022   Procedure: LAPAROSCOPIC INSERTION CONTINUOUS AMBULATORY PERITONEAL DIALYSIS  (CAPD) CATHETER;  Surgeon: Maeola Harman, MD;  Location: [facility] OR;  Service: Vascular;  Laterality: N/A;   CAPD REMOVAL  05/08/2011   Procedure: CONTINUOUS AMBULATORY PERITONEAL DIALYSIS  (CAPD) CATHETER REMOVAL;  Surgeon: Iona Coach, MD;  Location: [facility] OR;  Service: General;  Laterality: N/A;  Removal of CAPD catheter, Dr. requests to go after 100   CAPD REMOVAL N/A 08/08/2022  Procedure: REMOVAL CONTINUOUS AMBULATORY PERITONEAL DIALYSIS  (CAPD) CATHETER;  Surgeon: Maeola Harman, MD;  Location: [facility] OR;  Service: Vascular;  Laterality: N/A;   COLONOSCOPY WITH PROPOFOL N/A 05/20/2022   Procedure: COLONOSCOPY WITH PROPOFOL;  Surgeon: Shellia Cleverly, DO;  Location: [facility];  Service: Gastroenterology;  Laterality: N/A;   I & D EXTREMITY Right 12/08/2022   Procedure: IRRIGATION AND DEBRIDEMENT RIGHT FOOT;  Surgeon: Louann Sjogren, DPM;  Location: [facility] OR;  Service:  Orthopedics/Podiatry;  Laterality: Right;   INSERTION OF DIALYSIS CATHETER  10/05/2010   Right Femoral Cath insertion by Dr. Leonides Sake.  Pt ahas had several caths inserted.   INSERTION OF DIALYSIS CATHETER Right 02/15/2021   Procedure: Attempted INSERTION OF RIGHT and Left Internal Jugular DIALYSIS CATHETER, Insertion of Left Femoral Vein Dialysis Catheter;  Surgeon: Maeola Harman, MD;  Location: [facility] OR;  Service: Vascular;  Laterality: Right;   INSERTION OF DIALYSIS CATHETER Right 04/26/2021   Procedure: INSERTION OF TUNNELED 55cm PALIDROME PRECISION CHRONIC DIALYSIS CATHETER;  Surgeon: Leonie Douglas, MD;  Location: [facility] OR;  Service: Vascular;  Laterality: Right;   INSERTION OF DIALYSIS CATHETER Left 12/26/2021   Procedure: INSERTION OF TUNNELED  DIALYSIS CATHETER LEFT FEMORAL ARTERY;  Surgeon: Maeola Harman, MD;  Location: [facility] OR;  Service: Vascular;  Laterality: Left;   INSERTION OF DIALYSIS CATHETER Left 03/06/2022   Procedure: INSERTION OF DIALYSIS CATHETER;  Surgeon: Maeola Harman, MD;  Location: [facility] OR;  Service: Vascular;  Laterality: Left;   IR FLUORO GUIDE CV LINE LEFT  04/23/2022   IR FLUORO GUIDE CV LINE RIGHT  05/11/2018   IR FLUORO GUIDE CV LINE RIGHT  07/03/2021   IR FLUORO GUIDE CV LINE RIGHT  07/16/2021   IR PTA ADDL CENTRAL DIALYSIS SEG THRU DIALY CIRCUIT RIGHT Right 07/03/2021   IR US GUIDE VASC ACCESS LEFT  04/23/2022   IR US GUIDE VASC ACCESS RIGHT  05/11/2018   IR VENO/EXT/UNI LEFT  04/23/2022   IR VENOCAVAGRAM IVC  07/16/2021   KIDNEY TRANSPLANT  2014   KNEE ARTHROSCOPY Left    LAPAROSCOPIC LYSIS OF ADHESIONS N/A 06/13/2022   Procedure: LAPAROSCOPIC LYSIS OF ADHESIONS;  Surgeon: Maeola Harman, MD;  Location: [facility] OR;  Service: Vascular;  Laterality: N/A;   POLYPECTOMY  05/20/2022   Procedure: POLYPECTOMY;  Surgeon: Shellia Cleverly, DO;  Location: [facility];  Service: Gastroenterology;;   THROMBECTOMY W/ EMBOLECTOMY Right  04/26/2021   Procedure: REMOVAL OF RIGHT THIGH ARTERIOVENOUS GORE-TEX GRAFT AND REPAIR OF RIGHT COMMON FEMORAL ARTERY;  Surgeon: Leonie Douglas, MD;  Location: [facility] OR;  Service: Vascular;  Laterality: Right;   UPPER EXTREMITY ANGIOGRAPHY Bilateral 05/13/2018   Procedure: UPPER EXTREMITY ANGIOGRAPHY - bilarteral;  Surgeon: Cephus Shelling, MD;  [facility]: [facility];  Service: Cardiovascular;  Laterality: Bilateral;     reports that he quit smoking about 7 years ago. His smoking use included cigarettes. He has never used smokeless tobacco. He reports that he does not currently use alcohol. He reports that he does not currently use drugs after having used the following drugs: Marijuana.  No Known Allergies  Family History  Problem Relation Age of Onset   Diabetes Father    Stroke Father    Stroke Maternal Grandmother    Anesthesia problems Neg Hx      Prior to Admission medications   Medication Sig Start Date End Date Taking? Authorizing Provider  apixaban (ELIQUIS) 5 MG TABS tablet Take 5 mg by  mouth 2 (two) times daily. 10/10/21   [provider]  atorvastatin (LIPITOR) 40 MG tablet Take 1 tablet (40 mg total) by mouth daily. 12/20/21   [name], MD  B Complex-C-Zn-Folic Acid (DIALYVITE/ZINC) TABS Take 1 tablet by mouth daily. 01/16/22   [provider]  Calcium Carb-Cholecalciferol (CALCIUM 600 + D PO) Take 2 tablets by mouth daily.    [provider]  ferric citrate (AURYXIA) 1 GM 210 MG(Fe) tablet Take 630 mg by mouth in the morning, at noon, and at bedtime. 09/06/19   [provider]  metroNIDAZOLE (FLAGYL) 500 MG tablet Take 1 tablet (500 mg total) by mouth every 12 (twelve) hours for 24 days. 12/16/22 01/09/23  [name], MD  midodrine (PROAMATINE) 5 MG tablet Take 3 tablets (15 mg total) by mouth every 8 (eight) hours. 12/16/22 01/15/23  [name], MD  oxybutynin (DITROPAN) 5 MG tablet Take 1 tablet (5 mg total) by mouth 3  (three) times daily. 12/16/22 01/15/23  [name], MD  vancomycin (VANCOREADY) 1250 MG/250ML SOLN Inject 250 mLs (1,250 mg total) into the vein Every Tuesday,Thursday,and Saturday with dialysis for 22 days. 12/18/22 01/09/23  [name], MD    Physical Exam: Vitals:   12/31/22 1012 12/31/22 1352 12/31/22 1415 12/31/22 1448  BP: 119/78 119/66 113/77   Pulse: 66 (!) 53 (!) 50   Resp: 20 15 13    Temp: 98.8 F (37.1 C)   (!) 97.5 F (36.4 C)  TempSrc: Oral   Oral  SpO2: 100% 100% 100%   Weight: (!) 147 kg     Height: 6\' 2"  (1.88 m)       Constitutional: NAD, calm, comfortable Vitals:   12/31/22 1012 12/31/22 1352 12/31/22 1415 12/31/22 1448  BP: 119/78 119/66 113/77   Pulse: 66 (!) 53 (!) 50   Resp: 20 15 13    Temp: 98.8 F (37.1 C)   (!) 97.5 F (36.4 C)  TempSrc: Oral   Oral  SpO2: 100% 100% 100%   Weight: (!) 147 kg     Height: 6\' 2"  (1.88 m)      Eyes: PERRL, lids and conjunctivae normal ENMT: Mucous membranes are moist. Posterior pharynx clear of any exudate or lesions.Normal dentition.  Neck: normal, supple, no masses, no thyromegaly Respiratory: clear to auscultation bilaterally, no wheezing, no crackles. Normal respiratory effort. No accessory muscle use.  Cardiovascular: Regular rate and rhythm, no murmurs / rubs / gallops. No extremity edema. 2+ pedal pulses. No carotid bruits.  Abdomen: no tenderness, no masses palpated. No hepatosplenomegaly. Bowel sounds positive.  No significant rash or tenderness of left groin HD catheter insertion site Musculoskeletal: no clubbing / cyanosis. No joint deformity upper and lower extremities. Good ROM, no contractures. Normal muscle tone.  Skin: no rashes, lesions, ulcers. No induration Neurologic: CN 2-12 grossly intact. Sensation intact, DTR normal. Strength 5/5 in all 4.  Psychiatric: Normal judgment and insight. Alert and oriented x 3. Normal mood.    Labs on Admission: I have personally reviewed following labs and imaging  studies  CBC: Recent Labs  Lab 12/31/22 1302 12/31/22 1326  WBC 5.6  --   NEUTROABS 3.2  --   HGB 8.9* 9.5*  HCT 28.3* 28.0*  MCV 94.0  --   PLT 262  --    Basic Metabolic Panel: Recent Labs  Lab 12/31/22 1302 12/31/22 1326  NA 136 135  K 6.6* 6.6*  CL 96* 98  CO2 25  --   GLUCOSE 93  93  BUN 91* 86*  CREATININE 24.70* >18.00*  CALCIUM 9.6  --    GFR: CrCl cannot be calculated (This lab value cannot be used to calculate CrCl because it is not a number: >18.00). Liver Function Tests: Recent Labs  Lab 12/31/22 1302  AST 11*  ALT 9  ALKPHOS 56  BILITOT 0.7  PROT 7.5  ALBUMIN 3.6   No results for input(s): "LIPASE", "AMYLASE" in the last 168 hours. No results for input(s): "AMMONIA" in the last 168 hours. Coagulation Profile: No results for input(s): "INR", "PROTIME" in the last 168 hours. Cardiac Enzymes: No results for input(s): "CKTOTAL", "CKMB", "CKMBINDEX", "TROPONINI" in the last 168 hours. BNP (last 3 results) No results for input(s): "PROBNP" in the last 8760 hours. HbA1C: No results for input(s): "HGBA1C" in the last 72 hours. CBG: No results for input(s): "GLUCAP" in the last 168 hours. Lipid Profile: No results for input(s): "CHOL", "HDL", "LDLCALC", "TRIG", "CHOLHDL", "LDLDIRECT" in the last 72 hours. Thyroid Function Tests: No results for input(s): "TSH", "T4TOTAL", "FREET4", "T3FREE", "THYROIDAB" in the last 72 hours. Anemia Panel: No results for input(s): "VITAMINB12", "FOLATE", "FERRITIN", "TIBC", "IRON", "RETICCTPCT" in the last 72 hours. Urine analysis:    Component Value Date/Time   COLORURINE STRAW (A) 05/10/2018 1811   APPEARANCEUR CLEAR 05/10/2018 1811   LABSPEC 1.012 05/10/2018 1811   PHURINE 7.0 05/10/2018 1811   GLUCOSEU 150 (A) 05/10/2018 1811   HGBUR SMALL (A) 05/10/2018 1811   BILIRUBINUR NEGATIVE 11/03/2018 1515   KETONESUR NEGATIVE 05/10/2018 1811   PROTEINUR Positive (A) 11/03/2018 1515   PROTEINUR >=300 (A) 05/10/2018  1811   UROBILINOGEN 0.2 11/03/2018 1515   NITRITE NEGATIVE 11/03/2018 1515   NITRITE NEGATIVE 05/10/2018 1811   LEUKOCYTESUR Small (1+) (A) 11/03/2018 1515    Radiological Exams on Admission: No results found.  EKG: Independently reviewed.  Sinus, no acute tented T wave, first-degree AV block  Assessment/Plan Principal Problem:   Hyperkalemia Active Problems:   Essential hypertension   Paroxysmal atrial fibrillation (HCC)  (please populate well all problems here in Problem List. (For example, if patient is on BP meds at home and you resume or decide to hold them, it is a problem that needs to be her. Same for CAD, COPD, HLD and so on)  Acute hyperkalemia Acute on chronic uremia Acute on chronic azotemia -Secondary to missed HD sessions from malfunctioning left groin HD catheter -Received hyperkalemia cocktail including calcium gluconate, insulin and D50 and Lokelma -Neurosurgeon Dr. Juanetta Gosling consulted for emergency HD access -Emergency HD once HD access set up tonight -Nephrology consultation appreciated, agreed with checking K level every 4 hours until patient gets dialysis  Chronic right foot osteomyelitis -Consult pharmacy to resume IV vancomycin -Continue p.o. Flagyl -End date for both antibiotics will be August 2  History of PAF -Change Eliquis to heparin drip  Chronic HFpEF -Euvolemic, HD as above  ESRD on HD -As above  Chronic orthostatic hypotension, HD related -Midodrine on dialysis day   DVT prophylaxis: Heparin drip Code Status: Full code Family Communication: None at bedside Disposition Plan: Expect less than 2 midnight hospital stay Consults called: Nephrology, vascular surgery Admission status: Telemetry observation   Emeline General MD [facility] Pager [phone]  12/31/2022, 3:16 PM

## 2022-12-31 NOTE — Anesthesia Postprocedure Evaluation (Signed)
Anesthesia Post Note  Patient: [name]  Procedure(s) Performed: INSERTION OF DIALYSIS CATHETER LEFT GROIN (Left: Groin)     Patient location during evaluation: PACU Anesthesia Type: General Level of consciousness: patient cooperative, oriented and sedated Pain management: pain level controlled Vital Signs Assessment: post-procedure vital signs reviewed and stable Respiratory status: spontaneous breathing, nonlabored ventilation and respiratory function stable Cardiovascular status: blood pressure returned to baseline and stable Postop Assessment: no apparent nausea or vomiting Anesthetic complications: no   No notable events documented.  Last Vitals:  Vitals:   12/31/22 2239 12/31/22 2240  BP:    Pulse: 80 81  Resp: 20 (!) 7  Temp:    SpO2: 94% 94%    Last Pain:  Vitals:   12/31/22 2235  TempSrc:   PainSc: 2                  [name],E. Syvanna Ciolino

## 2022-12-31 NOTE — Progress Notes (Signed)
Pacu RN Report to floor given  Gave report to  [employer]. Room:5M12    Discussed surgery, meds given in OR and Pacu, VS, IV fluids given, EBL, urine output, pain and other pertinent information. Also discussed if pt had any family or friends here or belongings with them.   HD cath exchange to l groin. Pt doing well, VSS, no pain or bleeding at insertion site. Dressing is gauze and tegaderm, CDI. +PP via doppler.   Pt did not want anyone contacted.   Pt exits my care.

## 2022-12-31 NOTE — Op Note (Signed)
DATE OF SERVICE: 12/31/2022  PATIENT:  [name]  47 y.o. male  PRE-OPERATIVE DIAGNOSIS:  ESRD, malfunctioning dialysis catheter  POST-OPERATIVE DIAGNOSIS:  Same  PROCEDURE:   Exchange of left femoral tunneled dialysis catheter  SURGEON:  Surgeons and Role:    * Leonie Douglas, MD - Primary  ASSISTANT: none  ANESTHESIA:   general  EBL: minimal  BLOOD ADMINISTERED:none  DRAINS: none   LOCAL MEDICATIONS USED:  NONE  SPECIMEN:  none  COUNTS: confirmed correct.  TOURNIQUET:  none  PATIENT DISPOSITION:  PACU - hemodynamically stable.   Delay start of Pharmacological VTE agent (>24hrs) due to surgical blood loss or risk of bleeding: no  INDICATION FOR PROCEDURE: [name] is a 47 y.o. male with malfunctioning left femoral tunneled dialysis catheter. He has known central venous occlusion and IVC occlusion. His left iliac system is the only suitable place for catheter placement. After careful discussion of risks, benefits, and alternatives the patient was offered [facility] exchange. The patient understood and wished to proceed.  OPERATIVE FINDINGS: successful exchange of tunneled dialysis catheter. Angiogram shows heavily diseased pelvic vein with limited diameter, but patent and draining appropriately. Catheter working appropriately at completion of case.  DESCRIPTION OF PROCEDURE: After identification of the patient in the pre-operative holding area, the patient was transferred to the operating room. The patient was positioned supine on the operating room table. Anesthesia was induced. The left groin was prepped and draped in standard fashion. A surgical pause was performed confirming correct patient, procedure, and operative location.  [facility] was wired with two Amplatz wires. Fluoroscopy was used to visualize the wires outside of the existing catheter. The felt cuff was freed from its subcutaneous attachments. The catheter was removed over the wire. A new 28cm  catheter was fed over the two wires. It was confirmed to be in good position using fluroscopy. I then performed a limited pelvic angiogram through the catheter. This showed a heavily diseased, but patent pelvic vein that drained fairly briskly. I ended the case here. The catheter was secured to the skin in two positions. A bandage was applied.  Upon completion of the case instrument and sharps counts were confirmed correct. The patient was transferred to the  PACU in good condition. I was present for all portions of the procedure.  FOLLOW UP PLAN: If this catheter fails, he should be considered for transhepatic or translumbar access with interventional radiology.   Rande Brunt. Lenell Antu, MD [facility] Phone Number: [phone] 12/31/2022 9:49 PM

## 2022-12-31 NOTE — Consult Note (Signed)
[facility] [place]  ASSESSMENT / PLAN: 47 y.o. male with malfunctioning tunneled dialysis catheter.  He has no other options for dialysis access.  He has not received a dialysis treatment since Thursday.  His potassium is 6.6.  He needs to proceed to the operating room for catheter exchange.  CHIEF COMPLAINT: Malfunctioning dialysis catheter  HISTORY OF PRESENT ILLNESS: [name] is a 47 y.o. male well-known to our service with end-stage renal disease and few remaining permanent dialysis access options.  He has had multiple exchanges of a right femoral tunneled dialysis catheter.  He has a known IVC occlusion.  He has known SVC and central venous occlusion.  He has not received a dialysis treatment since Thursday.  He presents today for further management.  Past Medical History:  Diagnosis Date   Acute respiratory failure with hypoxia (HCC) 11/30/2018   Anemia    ESRD   ESRD on hemodialysis (HCC) 06/07/2011   TTS [facility]. Started HD in 2006, got transplant in 2014 lasted until Dec 2019 then went back on HD.  Has L thigh AVG as of Jun 2020.  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    History of blood transfusion    Hypertension    03/19/22- has not had high blood pressure in 5 years.   Morbid obesity (HCC)    Paroxysmal atrial fibrillation (HCC)    Pre-diabetes    no meds   Sepsis (HCC) 11/30/2018   Sleep apnea    lost weight, does not use CPAP    Past Surgical History:  Procedure Laterality Date   AMPUTATION Right 12/12/2022   Procedure: AMPUTATION RAY;  Surgeon: Louann Sjogren, DPM;  Location: [facility] OR;  Service: Orthopedics/Podiatry;  Laterality: Right;  surgical team to do local block   ARTERIOVENOUS GRAFT PLACEMENT  08/09/2010   Left Thigh Graft by Dr. Cari Caraway   AV FISTULA PLACEMENT Right 05/18/2018   Procedure: INSERTION OF ARTERIOVENOUS (AV) GORE-TEX GRAFT Left THIGH;  Surgeon: Maeola Harman, MD;  Location: [facility] OR;   Service: Vascular;  Laterality: Right;   AV FISTULA PLACEMENT Right 02/15/2021   Procedure: INSERTION OF ARTERIOVENOUS (AV) GORE-TEX LOOP GRAFT RIGHT THIGH;  Surgeon: Maeola Harman, MD;  Location: [facility] OR;  Service: Vascular;  Laterality: Right;   BIOPSY  05/20/2022   Procedure: BIOPSY;  Surgeon: Shellia Cleverly, DO;  Location: [facility];  Service: Gastroenterology;;   CAPD INSERTION N/A 06/13/2022   Procedure: LAPAROSCOPIC INSERTION CONTINUOUS AMBULATORY PERITONEAL DIALYSIS  (CAPD) CATHETER;  Surgeon: Maeola Harman, MD;  Location: [facility] OR;  Service: Vascular;  Laterality: N/A;   CAPD REMOVAL  05/08/2011   Procedure: CONTINUOUS AMBULATORY PERITONEAL DIALYSIS  (CAPD) CATHETER REMOVAL;  Surgeon: Iona Coach, MD;  Location: [facility] OR;  Service: General;  Laterality: N/A;  Removal of CAPD catheter, Dr. requests to go after 100   CAPD REMOVAL N/A 08/08/2022   Procedure: REMOVAL CONTINUOUS AMBULATORY PERITONEAL DIALYSIS  (CAPD) CATHETER;  Surgeon: Maeola Harman, MD;  Location: [facility] OR;  Service: Vascular;  Laterality: N/A;   COLONOSCOPY WITH PROPOFOL N/A 05/20/2022   Procedure: COLONOSCOPY WITH PROPOFOL;  Surgeon: Shellia Cleverly, DO;  Location: [facility];  Service: Gastroenterology;  Laterality: N/A;   I & D EXTREMITY Right 12/08/2022   Procedure: IRRIGATION AND DEBRIDEMENT RIGHT FOOT;  Surgeon: Louann Sjogren, DPM;  Location: [facility] OR;  Service: Orthopedics/Podiatry;  Laterality: Right;   INSERTION OF DIALYSIS CATHETER  10/05/2010   Right  Femoral Cath insertion by Dr. Leonides Sake.  Pt ahas had several caths inserted.   INSERTION OF DIALYSIS CATHETER Right 02/15/2021   Procedure: Attempted INSERTION OF RIGHT and Left Internal Jugular DIALYSIS CATHETER, Insertion of Left Femoral Vein Dialysis Catheter;  Surgeon: Maeola Harman, MD;  Location: [facility] OR;  Service: Vascular;  Laterality: Right;   INSERTION OF DIALYSIS CATHETER Right 04/26/2021   Procedure:  INSERTION OF TUNNELED 55cm PALIDROME PRECISION CHRONIC DIALYSIS CATHETER;  Surgeon: Leonie Douglas, MD;  Location: [facility] OR;  Service: Vascular;  Laterality: Right;   INSERTION OF DIALYSIS CATHETER Left 12/26/2021   Procedure: INSERTION OF TUNNELED  DIALYSIS CATHETER LEFT FEMORAL ARTERY;  Surgeon: Maeola Harman, MD;  Location: [facility] OR;  Service: Vascular;  Laterality: Left;   INSERTION OF DIALYSIS CATHETER Left 03/06/2022   Procedure: INSERTION OF DIALYSIS CATHETER;  Surgeon: Maeola Harman, MD;  Location: [facility] OR;  Service: Vascular;  Laterality: Left;   IR FLUORO GUIDE CV LINE LEFT  04/23/2022   IR FLUORO GUIDE CV LINE RIGHT  05/11/2018   IR FLUORO GUIDE CV LINE RIGHT  07/03/2021   IR FLUORO GUIDE CV LINE RIGHT  07/16/2021   IR PTA ADDL CENTRAL DIALYSIS SEG THRU DIALY CIRCUIT RIGHT Right 07/03/2021   IR US GUIDE VASC ACCESS LEFT  04/23/2022   IR US GUIDE VASC ACCESS RIGHT  05/11/2018   IR VENO/EXT/UNI LEFT  04/23/2022   IR VENOCAVAGRAM IVC  07/16/2021   KIDNEY TRANSPLANT  2014   KNEE ARTHROSCOPY Left    LAPAROSCOPIC LYSIS OF ADHESIONS N/A 06/13/2022   Procedure: LAPAROSCOPIC LYSIS OF ADHESIONS;  Surgeon: Maeola Harman, MD;  Location: [facility] OR;  Service: Vascular;  Laterality: N/A;   POLYPECTOMY  05/20/2022   Procedure: POLYPECTOMY;  Surgeon: Shellia Cleverly, DO;  Location: [facility];  Service: Gastroenterology;;   THROMBECTOMY W/ EMBOLECTOMY Right 04/26/2021   Procedure: REMOVAL OF RIGHT THIGH ARTERIOVENOUS GORE-TEX GRAFT AND REPAIR OF RIGHT COMMON FEMORAL ARTERY;  Surgeon: Leonie Douglas, MD;  Location: [facility] OR;  Service: Vascular;  Laterality: Right;   UPPER EXTREMITY ANGIOGRAPHY Bilateral 05/13/2018   Procedure: UPPER EXTREMITY ANGIOGRAPHY - bilarteral;  Surgeon: Cephus Shelling, MD;  Location: [facility];  Service: Cardiovascular;  Laterality: Bilateral;    Family History  Problem Relation Age of Onset   Diabetes Father    Stroke Father     Stroke Maternal Grandmother    Anesthesia problems Neg Hx     Social History   Socioeconomic History   Marital status: Single    Spouse name: Not on file   Number of children: 0   Years of education: Not on file   Highest education level: Not on file  Occupational History   Not on file  Tobacco Use   Smoking status: Former    Current packs/day: 0.00    Types: Cigarettes    Quit date: 11/08/2015    Years since quitting: 7.1   Smokeless tobacco: Never  Vaping Use   Vaping status: Never Used  Substance and Sexual Activity   Alcohol use: Not Currently   Drug use: Not Currently    Types: Marijuana    Comment: Last use was in 04/2022   Sexual activity: Yes  Other Topics Concern   Not on file  Social History Narrative   Lives alone.    Social Determinants of Health   Financial Resource Strain: Low Risk  (12/29/2022)   Overall Financial Resource Strain (CARDIA)  Difficulty of Paying Living Expenses: Not hard at all  Food Insecurity: No Food Insecurity (12/29/2022)   Hunger Vital Sign    Worried About Running Out of Food in the Last Year: Never true    Ran Out of Food in the Last Year: Never true  Transportation Needs: No Transportation Needs (12/29/2022)   PRAPARE - Administrator, Civil Service (Medical): No    Lack of Transportation (Non-Medical): No  Physical Activity: Insufficiently Active (12/29/2022)   Exercise Vital Sign    Days of Exercise per Week: 3 days    Minutes of Exercise per Session: 30 min  Stress: No Stress Concern Present (12/29/2022)   [employer] of Occupational Health - Occupational Stress Questionnaire    Feeling of Stress : Not at all  Social Connections: Socially Isolated (12/29/2022)   Social Connection and Isolation Panel [NHANES]    Frequency of Communication with Friends and Family: More than three times a week    Frequency of Social Gatherings with Friends and Family: More than three times a week    Attends Religious Services:  Never    Database administrator or Organizations: No    Attends Banker Meetings: Never    Marital Status: Never married  Intimate Partner Violence: Not At Risk (12/29/2022)   Humiliation, Afraid, Rape, and Kick questionnaire    Fear of Current or Ex-Partner: No    Emotionally Abused: No    Physically Abused: No    Sexually Abused: No    No Known Allergies  Current Facility-Administered Medications  Medication Dose Route Frequency Provider Last Rate Last Admin   [START ON 01/01/2023] Chlorhexidine Gluconate Cloth 2 % PADS 6 each  6 each Topical Q0600 Delano Metz, MD       sodium zirconium cyclosilicate (LOKELMA) packet 10 g  10 g Oral TID Delano Metz, MD       Current Outpatient Medications  Medication Sig Dispense Refill   apixaban (ELIQUIS) 5 MG TABS tablet Take 5 mg by mouth 2 (two) times daily.     atorvastatin (LIPITOR) 40 MG tablet Take 1 tablet (40 mg total) by mouth daily. 90 tablet 3   B Complex-C-Zn-Folic Acid (DIALYVITE/ZINC) TABS Take 1 tablet by mouth daily.     Calcium Carb-Cholecalciferol (CALCIUM 600 + D PO) Take 2 tablets by mouth daily.     ferric citrate (AURYXIA) 1 GM 210 MG(Fe) tablet Take 630 mg by mouth in the morning, at noon, and at bedtime.     metroNIDAZOLE (FLAGYL) 500 MG tablet Take 1 tablet (500 mg total) by mouth every 12 (twelve) hours for 24 days. 48 tablet 0   midodrine (PROAMATINE) 5 MG tablet Take 3 tablets (15 mg total) by mouth every 8 (eight) hours. 270 tablet 0   oxybutynin (DITROPAN) 5 MG tablet Take 1 tablet (5 mg total) by mouth 3 (three) times daily. 90 tablet 0   vancomycin (VANCOREADY) 1250 MG/250ML SOLN Inject 250 mLs (1,250 mg total) into the vein Every Tuesday,Thursday,and Saturday with dialysis for 22 days.      PHYSICAL EXAM Vitals:   12/31/22 1012 12/31/22 1352 12/31/22 1415 12/31/22 1448  BP: 119/78 119/66 113/77   Pulse: 66 (!) 53 (!) 50   Resp: 20 15 13    Temp: 98.8 F (37.1 C)   (!) 97.5 F (36.4 C)   TempSrc: Oral   Oral  SpO2: 100% 100% 100%   Weight: (!) 147 kg     Height: 6\' 2"  (  1.88 m)      Chronically ill obese man in no distress Regular rate and rhythm Unlabored breathing Left groin with TDC in place  PERTINENT LABORATORY AND RADIOLOGIC DATA  Most recent CBC    Latest Ref Rng & Units 12/31/2022    1:26 PM 12/31/2022    1:02 PM 12/16/2022    7:30 AM  CBC  WBC 4.0 - 10.5 K/uL  5.6  8.0   Hemoglobin 13.0 - 17.0 g/dL 9.5  8.9  8.8   Hematocrit 39.0 - 52.0 % 28.0  28.3  28.3   Platelets 150 - 400 K/uL  262  381      Most recent CMP    Latest Ref Rng & Units 12/31/2022    1:26 PM 12/31/2022    1:02 PM 12/16/2022    7:30 AM  CMP  Glucose 70 - 99 mg/dL 93  93  83   BUN 6 - 20 mg/dL 86  91  90   Creatinine 0.61 - 1.24 mg/dL >30.86  57.84  69.62   Sodium 135 - 145 mmol/L 135  136  129   Potassium 3.5 - 5.1 mmol/L 6.6  6.6  5.9   Chloride 98 - 111 mmol/L 98  96  91   CO2 22 - 32 mmol/L  25  20   Calcium 8.9 - 10.3 mg/dL  9.6  95.2   Total Protein 6.5 - 8.1 g/dL  7.5    Total Bilirubin 0.3 - 1.2 mg/dL  0.7    Alkaline Phos 38 - 126 U/L  56    AST 15 - 41 U/L  11    ALT 0 - 44 U/L  9      Renal function CrCl cannot be calculated (This lab value cannot be used to calculate CrCl because it is not a number: >18.00).  Hgb A1c MFr Bld (%)  Date Value  12/06/2022 5.7 (H)    LDL Cholesterol  Date Value Ref Range Status  12/05/2020 146 (H) 0 - 99 mg/dL Final    Rande Brunt. Lenell Antu, MD FACS [facility] Phone Number: [phone] 12/31/2022 3:10 PM   Total time spent on preparing this encounter including chart review, data review, collecting history, examining the patient, coordinating care for this established patient, 30 minutes.  Portions of this report may have been transcribed using voice recognition software.  Every effort has been made to ensure accuracy; however, inadvertent computerized transcription errors may still be  present.

## 2022-12-31 NOTE — Progress Notes (Signed)
Called 3x to get information from nurse regarding pt to plan his dialysis not successful.

## 2022-12-31 NOTE — Patient Instructions (Signed)
Visit Information  Thank you for taking time to visit with me today. Please don't hesitate to contact me if I can be of assistance to you.   Following are the goals we discussed today:   Goals Addressed             This Visit's Progress    Health Maintainance       Care Coordination Interventions: Evaluation of current treatment plan related to ESRD management and patient's adherence to plan as established by provider Discussed plans with patient for ongoing care management follow up and provided patient with direct contact information for care management team  Patient currently in the ED with problems with his dialysis catheter.  Advised patient that CM would follow up upon hospital disposition. He verbalized understanding,                If you are experiencing a Mental Health or Behavioral Health Crisis or need someone to talk to, please call the Suicide and Crisis Lifeline: 988   Patient verbalizes understanding of instructions and care plan provided today and agrees to view in MyChart. Active MyChart status and patient understanding of how to access instructions and care plan via MyChart confirmed with patient.     The patient has been provided with contact information for the care management team and has been advised to call with any health related questions or concerns.   Bary Leriche, RN, MSN [facility] Care Management Care Management Coordinator Direct Line [phone]

## 2022-12-31 NOTE — Progress Notes (Signed)
Pharmacy Antibiotic Note  [name] is a 47 y.o. male for which pharmacy has been consulted for vancomycin dosing for  osteomyelitis .  Patient with a history of ESRD on HD TTS, AF on eliquis, carotid AV fistula, OSA, chronic rt foot osteo MRSA. Patient presenting with left groin HD catheter problem and missed HD x 2.  Patient receiving Vancomycin and flagyl prior to arrival. Vancomycin 1250mg  being given in dialysis. Patient states that HD did not give vancomycin yesterday 2/2 high level (was not dialyzed yesterday). Patient states last dose given was 7/18.   Plan was for vancomycin/flagyl until 8/2.  WBC 5.6; T 97.5; HR 93; RR 14  Plan: Flagyl per MD Vancomycin random ordered to determine if re-dose indicated Monitor WBC, fever, renal function, cultures F/u Nephrology plan  Height: 6\' 2"  (188 cm) Weight: (!) 147 kg (324 lb 1.2 oz) IBW/kg (Calculated) : 82.2  Temp (24hrs), Avg:98.2 F (36.8 C), Min:97.5 F (36.4 C), Max:98.8 F (37.1 C)  Recent Labs  Lab 12/31/22 1302 12/31/22 1326  WBC 5.6  --   CREATININE 24.70* >18.00*    CrCl cannot be calculated (This lab value cannot be used to calculate CrCl because it is not a number: >18.00).    No Known Allergies  Microbiology results: Pending  Thank you for allowing pharmacy to be a part of this patient's care.  Delmar Landau, PharmD, BCPS 12/31/2022 3:17 PM ED Clinical Pharmacist -  [phone]

## 2022-12-31 NOTE — ED Notes (Signed)
This paramedic called dialysis back as was not able to answer when they called. Secretary states the charge will have to call me back.

## 2022-12-31 NOTE — Progress Notes (Signed)
Pt receives out-pt HD at [facility] SW TTS. Will assist as needed.   Olivia Canter Renal Navigator [phone]

## 2022-12-31 NOTE — Patient Outreach (Signed)
  Care Coordination   Follow Up Visit Note   12/31/2022 Name: [name] MRN: [id number] DOB: 01/08/1976  [name] is a 47 y.o. year old male who sees Rudd, Bertram Millard, MD for primary care. I spoke with  [name] by phone today.  What matters to the patients health and wellness today?  Problems with dialysis access. Patient at the ED   Goals Addressed             This Visit's Progress    Health Maintainance       Care Coordination Interventions: Evaluation of current treatment plan related to ESRD management and patient's adherence to plan as established by provider Discussed plans with patient for ongoing care management follow up and provided patient with direct contact information for care management team  Patient currently in the ED with problems with his dialysis catheter.  Advised patient that CM would follow up upon hospital disposition. He verbalized understanding,              SDOH assessments and interventions completed:  Yes     Care Coordination Interventions:  Yes, provided   Follow up plan:  RN CM will follow up after hospital disposition    Encounter Outcome:  Pt. Visit Completed   Bary Leriche, RN, MSN [facility] Care Management Care Management Coordinator Direct Line [phone]

## 2022-12-31 NOTE — Plan of Care (Signed)
  Problem: Education: Goal: Knowledge of General Education information will improve Description Including pain rating scale, medication(s)/side effects and non-pharmacologic comfort measures Outcome: Progressing   

## 2022-12-31 NOTE — Anesthesia Preprocedure Evaluation (Addendum)
Anesthesia Evaluation  Patient identified by MRN, date of birth, ID band Patient awake    Reviewed: Allergy & Precautions, NPO status , Patient's Chart, lab work & pertinent test results  History of Anesthesia Complications Negative for: history of anesthetic complications  Airway Mallampati: II  TM Distance: >3 FB Neck ROM: Full    Dental  (+) Dental Advisory Given   Pulmonary sleep apnea (does not use CPAP) , Patient abstained from smoking., former smoker   breath sounds clear to auscultation       Cardiovascular hypertension, (-) angina +CHF   Rhythm:Regular Rate:Normal  Mitodrine BP support  11/2022 ECHO: EF 55 to 60%.  1. The LV has normal function, no regional wall motion abnormalities. Left ventricular diastolic parameters were normal.   2. RVF was not well visualized. The right ventricular size is normal. Tricuspid regurgitation signal is inadequate for assessing PA pressure.   3. The mitral valve is grossly normal. No evidence of MR. No evidence of MS.   4. The aortic valve was not well visualized. Aortic valve regurgitation is not visualized. No aortic stenosis is present.     Neuro/Psych negative neurological ROS     GI/Hepatic negative GI ROS, Neg liver ROS,,,  Endo/Other  BMI  41.6  Renal/GU Dialysis and ESRFRenal disease (TuThSa)K+ 5.6     Musculoskeletal   Abdominal  (+) + obese  Peds  Hematology  (+) Blood dyscrasia (Hb 11.2, plt 262k), anemia Eliquis   Anesthesia Other Findings   Reproductive/Obstetrics                             Anesthesia Physical Anesthesia Plan  ASA: 3  Anesthesia Plan: General   Post-op Pain Management: Tylenol PO (pre-op)* and Minimal or no pain anticipated   Induction: Intravenous  PONV Risk Score and Plan: 2 and Ondansetron and Dexamethasone  Airway Management Planned: LMA  Additional Equipment: None  Intra-op Plan:    Post-operative Plan:   Informed Consent: I have reviewed the patients History and Physical, chart, labs and discussed the procedure including the risks, benefits and alternatives for the proposed anesthesia with the patient or authorized representative who has indicated his/her understanding and acceptance.     Dental advisory given  Plan Discussed with: CRNA and Surgeon  Anesthesia Plan Comments:         Anesthesia Quick Evaluation

## 2022-12-31 NOTE — ED Provider Notes (Signed)
[place] [facility]ERGENCY DEPARTMENT AT [facility] Provider Note   CSN: [id number] Arrival date & time: 12/31/22  4098     History  Chief Complaint  Patient presents with   Vascular Access Problem    [name] is a 47 y.o. male.  HPI     47 year old male with history of ESRD on dialysis Tuesday Thursday Saturday, history of failed renal transplant (2014, on dialysis 2019), paroxysmal atrial fibrillation on Eliquis, recent incision and drainage July 1 of right foot incision abscess who presents with concern that his left groin catheter has not been working.  Reports his last full dialysis session was last Tuesday.  On Thursday, they were able to do a little bit of dialysis per patient, however clotted off and he was unable to complete his session.  He went again on Saturday, but was then unable to have dialysis, and went again yesterday and was unable to have dialysis.  He reports he no longer makes urine.  Denies any shortness of breath, chest pain, leg pain, fever or other concerns.  He is here because he has been unable to have dialysis.    Past Medical History:  Diagnosis Date   Acute respiratory failure with hypoxia (HCC) 11/30/2018   Anemia    ESRD   ESRD on hemodialysis (HCC) 06/07/2011   TTS [place]. Started HD in 2006, got transplant in 2014 lasted until Dec 2019 then went back on HD.  Has L thigh AVG as of Jun 2020.  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    History of blood transfusion    Hypertension    03/19/22- has not had high blood pressure in 5 years.   Morbid obesity (HCC)    Paroxysmal atrial fibrillation (HCC)    Pre-diabetes    no meds   Sepsis (HCC) 11/30/2018   Sleep apnea    lost weight, does not use CPAP    Home Medications Prior to Admission medications   Medication Sig Start Date End Date Taking? Authorizing Provider  apixaban (ELIQUIS) 5 MG TABS tablet Take 5 mg by mouth 2 (two) times daily. 10/10/21  Yes [provider]  atorvastatin (LIPITOR) 40 MG tablet Take 1 tablet (40 mg total) by mouth daily. 12/20/21  Yes [name], MD  B Complex-C-Zn-Folic Acid (DIALYVITE/ZINC) TABS Take 1 tablet by mouth daily. 01/16/22  Yes [provider]  Calcium Carb-Cholecalciferol (CALCIUM 600 + D PO) Take 2 tablets by mouth daily.   Yes [provider]  ferric citrate (AURYXIA) 1 GM 210 MG(Fe) tablet Take 210 mg by mouth See admin instructions. Take 3 tablets 3 times daily and 1 tablet twice daily with meals 09/06/19  Yes [provider]  metroNIDAZOLE (FLAGYL) 500 MG tablet Take 1 tablet (500 mg total) by mouth every 12 (twelve) hours for 24 days. 12/16/22 01/09/23 Yes [name], MD  midodrine (PROAMATINE) 5 MG tablet Take 3 tablets (15 mg total) by mouth every 8 (eight) hours. 12/16/22 01/15/23 Yes [name], MD  oxybutynin (DITROPAN) 5 MG tablet Take 1 tablet (5 mg total) by mouth 3 (three) times daily. 12/16/22 01/15/23 Yes [name], MD  vancomycin (VANCOREADY) 1250 MG/250ML SOLN Inject 250 mLs (1,250 mg total) into the vein Every Tuesday,Thursday,and Saturday with dialysis for 22 days. 12/18/22 01/09/23 Yes [name], MD      Allergies    Patient has no known allergies.    Review of Systems   Review of  Systems  Physical Exam Updated Vital Signs BP 110/71 (BP Location: Right Arm)   Pulse 70   Temp (!) 97.5 F (36.4 C) (Oral)   Resp 18   Ht 6\' 2"  (1.88 m)   Wt (!) 147 kg   SpO2 100%   BMI 41.61 kg/m  Physical Exam Vitals and nursing note reviewed.  Constitutional:      General: He is not in acute distress.    Appearance: Normal appearance. He is not ill-appearing, toxic-appearing or diaphoretic.  HENT:     Head: Normocephalic.  Eyes:     Conjunctiva/sclera: Conjunctivae normal.  Cardiovascular:     Rate and Rhythm: Normal rate and regular rhythm.     Pulses: Normal pulses.  Pulmonary:     Effort: Pulmonary effort is normal. No respiratory distress.   Musculoskeletal:        General: No deformity or signs of injury.     Cervical back: No rigidity.  Skin:    General: Skin is warm and dry.     Coloration: Skin is not jaundiced or pale.  Neurological:     General: No focal deficit present.     Mental Status: He is alert and oriented to person, place, and time.     ED Results / Procedures / Treatments   Labs (all labs ordered are listed, but only abnormal results are displayed) Labs Reviewed  CBC WITH DIFFERENTIAL/PLATELET - Abnormal; Notable for the following components:      Result Value   RBC 3.01 (*)    Hemoglobin 8.9 (*)    HCT 28.3 (*)    RDW 18.4 (*)    All other components within normal limits  COMPREHENSIVE METABOLIC PANEL - Abnormal; Notable for the following components:   Potassium 6.6 (*)    Chloride 96 (*)    BUN 91 (*)    Creatinine, Ser 24.70 (*)    AST 11 (*)    GFR, Estimated 2 (*)    All other components within normal limits  APTT - Abnormal; Notable for the following components:   aPTT 42 (*)    All other components within normal limits  HEPARIN LEVEL (UNFRACTIONATED) - Abnormal; Notable for the following components:   Heparin Unfractionated >1.10 (*)    All other components within normal limits  I-STAT CHEM 8, ED - Abnormal; Notable for the following components:   Potassium 6.6 (*)    BUN 86 (*)    Creatinine, Ser >18.00 (*)    Calcium, Ion 1.10 (*)    Hemoglobin 9.5 (*)    HCT 28.0 (*)    All other components within normal limits  POCT I-STAT, CHEM 8 - Abnormal; Notable for the following components:   Potassium 5.6 (*)    BUN 83 (*)    Creatinine, Ser >18.00 (*)    Calcium, Ion 1.12 (*)    Hemoglobin 11.2 (*)    HCT 33.0 (*)    All other components within normal limits  SURGICAL PCR SCREEN  HEPATITIS B SURFACE ANTIGEN  VANCOMYCIN, RANDOM  HEPATITIS B SURFACE ANTIBODY, QUANTITATIVE  POTASSIUM  POTASSIUM  BASIC METABOLIC PANEL  HEPARIN LEVEL (UNFRACTIONATED)  APTT    EKG EKG  Interpretation Date/Time:  Wednesday December 31 2022 11:59:09 EDT Ventricular Rate:  54 PR Interval:  214 QRS Duration:  96 QT Interval:  464 QTC Calculation: 440 R Axis:   -43  Text Interpretation: Sinus bradycardia with 1st degree A-V block Left axis deviation Low voltage QRS Cannot rule  out Anterior infarct , age undetermined Abnormal ECG When compared with ECG of 06-Dec-2022 11:44, Confirmed by Alvira Monday ([id number]) on 12/31/2022 1:15:32 PM  Radiology No results found.  Procedures .Critical Care  Performed by: Alvira Monday, MD Authorized by: Alvira Monday, MD   Critical care provider statement:    Critical care time (minutes):  30   Critical care was time spent personally by me on the following activities:  Development of treatment plan with patient or surrogate, discussions with consultants, evaluation of patient's response to treatment, examination of patient, ordering and review of laboratory studies, ordering and review of radiographic studies, ordering and performing treatments and interventions, pulse oximetry, re-evaluation of patient's condition and review of old charts     Medications Ordered in ED Medications  Chlorhexidine Gluconate Cloth 2 % PADS 6 each ( Topical Automatically Held 01/09/23 0600)  sodium zirconium cyclosilicate (LOKELMA) packet 10 g ( Oral Automatically Held 12/31/22 2200)  metroNIDAZOLE (FLAGYL) tablet 500 mg ( Oral Automatically Held 01/08/23 2200)  atorvastatin (LIPITOR) tablet 40 mg ( Oral Automatically Held 01/09/23 1000)  midodrine (PROAMATINE) tablet 15 mg ( Oral Automatically Held 01/08/23 2200)  ferric citrate (AURYXIA) tablet 630 mg ( Oral Automatically Held 01/08/23 1700)  oxybutynin (DITROPAN) tablet 5 mg ( Oral Automatically Held 01/08/23 2200)  heparin ADULT infusion 100 units/mL (25000 units/279mL) (1,600 Units/hr Intravenous New Bag/Given 12/31/22 1920)  vancomycin variable dose per unstable renal function (pharmacist dosing) ( Does not  apply [facility] Hold 12/31/22 2136)  0.9 % irrigation (POUR BTL) (1,000 mLs Irrigation Given 12/31/22 2140)  heparin 6000 units / NS 500 mL irrigation (1 Application Irrigation Given 12/31/22 2141)  heparin sodium (porcine) injection (4.2 Units Intracatheter Given 12/31/22 2150)  iodixanol (VISIPAQUE) 320 MG/ML injection (28 mLs Other Given 12/31/22 2150)  calcium gluconate 1 g/ 50 mL sodium chloride IVPB (0 mg Intravenous Stopped 12/31/22 1454)  albuterol (PROVENTIL) (2.5 MG/3ML) 0.083% nebulizer solution 10 mg ( Nebulization Not Given 12/31/22 1456)  insulin aspart (novoLOG) injection 5 Units (5 Units Intravenous Given 12/31/22 1414)    And  dextrose 50 % solution 50 mL (50 mLs Intravenous Given 12/31/22 1418)  sodium bicarbonate injection 50 mEq (50 mEq Intravenous Given 12/31/22 1415)  acetaminophen (TYLENOL) tablet 1,000 mg (1,000 mg Oral Given 12/31/22 2037)    ED Course/ Medical Decision Making/ A&P                                47 year old male with history of ESRD on dialysis Tuesday Thursday Saturday, history of failed renal transplant (2014, on dialysis 2019), paroxysmal atrial fibrillation on Eliquis, recent incision and drainage July 1 of right foot incision abscess who presents with concern that his left groin catheter has not been working.  He has no hypoxia, no dyspnea.  I-Stat Chem 8 obtained showing K 6.6. EKG evaluated by me without acute changes. Insulin, dextrose, calcium, lokelma ordered and discussed with nursing.    Consulted Nephrology, Dr. Arlean Hopping.  Consulted vascular surgery, Dr. Lenell Antu who came to bedside to evaluate and will plan to take to OR to establish access for dialysis in setting of current catheter dysfunction.         Final Clinical Impression(s) / ED Diagnoses Final diagnoses:  ESRD (end stage renal disease) (HCC)  Hyperkalemia    Rx / DC Orders ED Discharge Orders     None         Alvira Monday, MD  12/31/22 2203  

## 2022-12-31 NOTE — Progress Notes (Signed)
Pharmacy Antibiotic Note  [name] is a 47 y.o. male for which pharmacy has been consulted for vancomycin dosing for  osteomyelitis . Patient with a history of ESRD on HD TTS, AF on eliquis, carotid AV fistula, OSA, chronic rt foot osteo MRSA. Patient presenting with left groin HD catheter problem and missed HD x 2.  Patient receiving Vancomycin and flagyl prior to arrival. Vancomycin 1250mg  being given in dialysis. Patient states that HD did not give vancomycin yesterday 2/2 high level (was not dialyzed yesterday). Patient states last dose given was 7/18. Plan was for vancomycin/flagyl until 8/2.  Vancomycin random is 28 mcg/ml - no vancomycin needed for now.  Plan: Flagyl per MD Hold vancomycin - will recheck level after next HD session   Height: 6\' 2"  (188 cm) Weight: (!) 147 kg (324 lb 1.2 oz) IBW/kg (Calculated) : 82.2  Temp (24hrs), Avg:97.9 F (36.6 C), Min:97.5 F (36.4 C), Max:98.8 F (37.1 C)  Recent Labs  Lab 12/31/22 1302 12/31/22 1326 12/31/22 1759 12/31/22 1838  WBC 5.6  --   --   --   CREATININE 24.70* >18.00* >18.00*  --   VANCORANDOM  --   --   --  28    CrCl cannot be calculated (This lab value cannot be used to calculate CrCl because it is not a number: >18.00).    No Known Allergies  Microbiology results: Pending  Thank you for allowing pharmacy to be a part of this patient's care.  Fredonia Highland, PharmD, BCPS, [facility] Clinical Pharmacist [phone] Please check AMION for all [facility] Pharmacy numbers 12/31/2022

## 2022-12-31 NOTE — Transfer of Care (Signed)
Immediate Anesthesia Transfer of Care Note  Patient: Azariah Bonura Preslar  Procedure(s) Performed: INSERTION OF DIALYSIS CATHETER LEFT GROIN (Left: Groin)  Patient Location: PACU  Anesthesia Type:General  Level of Consciousness: drowsy  Airway & Oxygen Therapy: Patient Spontanous Breathing  Post-op Assessment: Report given to RN and Post -op Vital signs reviewed and stable  Post vital signs: Reviewed and stable  Last Vitals:  Vitals Value Taken Time  BP    Temp    Pulse 77 12/31/22 2213  Resp 18 12/31/22 2213  SpO2 100 % 12/31/22 2213  Vitals shown include unfiled device data.  Last Pain:  Vitals:   12/31/22 1930  TempSrc:   PainSc: 0-No pain         Complications: No notable events documented.

## 2022-12-31 NOTE — ED Triage Notes (Signed)
Pt states dialysis catheter in left groin is clogged and hasn't worked since last Thursday. Pt states it was replaced last week and has only been used one since. Pt went to dialysis Saturday and Tuesday but was unable to get dialyzed.

## 2023-01-01 ENCOUNTER — Encounter (HOSPITAL_COMMUNITY): Payer: Self-pay | Admitting: Vascular Surgery

## 2023-01-01 ENCOUNTER — Encounter (HOSPITAL_COMMUNITY): Payer: Self-pay | Admitting: Pharmacist Clinician (PhC)/ Clinical Pharmacy Specialist

## 2023-01-01 DIAGNOSIS — E875 Hyperkalemia: Secondary | ICD-10-CM | POA: Diagnosis not present

## 2023-01-01 LAB — CBC
HCT: 28.3 % — ABNORMAL LOW (ref 39.0–52.0)
Hemoglobin: 8.9 g/dL — ABNORMAL LOW (ref 13.0–17.0)
MCH: 28.3 pg (ref 26.0–34.0)
MCV: 90.1 fL (ref 80.0–100.0)
Platelets: 252 10*3/uL (ref 150–400)
RBC: 3.14 MIL/uL — ABNORMAL LOW (ref 4.22–5.81)
RDW: 18.4 % — ABNORMAL HIGH (ref 11.5–15.5)
WBC: 4.2 10*3/uL (ref 4.0–10.5)

## 2023-01-01 LAB — BASIC METABOLIC PANEL
Anion gap: 17 — ABNORMAL HIGH (ref 5–15)
BUN: 56 mg/dL — ABNORMAL HIGH (ref 6–20)
CO2: 25 mmol/L (ref 22–32)
Calcium: 9.5 mg/dL (ref 8.9–10.3)
Chloride: 93 mmol/L — ABNORMAL LOW (ref 98–111)
Creatinine, Ser: 17.57 mg/dL — ABNORMAL HIGH (ref 0.61–1.24)
GFR, Estimated: 3 mL/min — ABNORMAL LOW (ref >60)
Glucose, Bld: 67 mg/dL — ABNORMAL LOW (ref 70–99)
Potassium: 5.7 mmol/L — ABNORMAL HIGH (ref 3.5–5.1)
Sodium: 135 mmol/L (ref 135–145)

## 2023-01-01 MED ORDER — VANCOMYCIN HCL 750 MG/150ML IV SOLN
750.0000 mg | Freq: Once | INTRAVENOUS | Status: AC
  Administered 2023-01-01: 750 mg via INTRAVENOUS
  Filled 2023-01-01: qty 150

## 2023-01-01 MED ORDER — APIXABAN 5 MG PO TABS
5.0000 mg | ORAL_TABLET | Freq: Two times a day (BID) | ORAL | Status: DC
  Administered 2023-01-01: 5 mg via ORAL
  Filled 2023-01-01: qty 1

## 2023-01-01 NOTE — Progress Notes (Signed)
Received patient in bed to unit.  Alert and oriented.  Informed consent signed and in chart.   TX duration: 4hours  Patient tolerated well.  Transported back to the room  Alert, without acute distress.  Hand-off given to patient's nurse.   Access used: Left Femoral Catheter Access issues: Access flow limited by weight of abdomen at times.  Total UF removed: 5000 mL Medication(s) given: None Post HD VS: 107/78 (88) - 78 - 14 - 99% - 98.2 Post HD weight: 139.8 kg per bed scale     01/01/23 0416  Vitals  BP 107/78  MAP (mmHg) 88  BP Location Right Arm  BP Method Automatic  Patient Position (if appropriate) Lying  Pulse Rate 78  Pulse Rate Source Monitor  ECG Heart Rate 78  Resp 14  Oxygen Therapy  SpO2 99 %  O2 Device Room Air  Patient Activity (if Appropriate) In bed  Pulse Oximetry Type Continuous  During Treatment Monitoring  Intra-Hemodialysis Comments See progress note (post rinseback)  Post Treatment  Dialyzer Clearance Lightly streaked  Duration of HD Treatment -hour(s) 4 hour(s)  Hemodialysis Intake (mL) 0 mL  Liters Processed 95.9  Fluid Removed (mL) 5000 mL  Tolerated HD Treatment Yes  Note  Patient Observations Patient A&O x 4, VSS, no acute distress, condition stable for this d/c.  Hemodialysis Catheter Left Femoral vein Double lumen Permanent (Tunneled)  Placement Date/Time: 12/31/22 2155   Placed prior to admission: No  Serial / Lot #: [id number]  Expiration Date: 09/19/26  Time Out: Correct patient;Correct site;Correct procedure  Maximum sterile barrier precautions: Hand hygiene;Mask;Cap;Sterile gow...  Site Condition No complications  Blue Lumen Status Flushed;Heparin locked;Dead end cap in place  Red Lumen Status Flushed;Heparin locked;Dead end cap in place  Purple Lumen Status N/A  Catheter fill solution Heparin 1000 units/ml  Catheter fill volume (Arterial) 2.1 cc  Catheter fill volume (Venous) 2.1  Dressing Type Gauze/Drain sponge  Dressing  Status Clean, Dry, Intact  Drainage Description None  Dressing Change Due 01/01/23  Post treatment catheter status Capped and Clamped     [facility]

## 2023-01-01 NOTE — Discharge Summary (Signed)
Physician Discharge Summary  [name] BJY:[id number] DOB: [birth year] DOA: 12/31/2022  PCP: Robert Mast, MD  Admit date: 12/31/2022 Discharge date: 01/01/2023  Admitted From: Home Disposition: Home  Recommendations for Outpatient Follow-up:  Follow-up at the dialysis center Antibiotics with dialysis Vascular surgery will schedule follow-up  Home Health: N/A Equipment/Devices: N/A  Discharge Condition: Stable CODE STATUS: Full code Diet recommendation: Low-salt diet  Discharge summary: 46 year old gentleman with recent admission for right foot osteomyelitis, ESRD on hemodialysis with multiple fistula failures and access problem presented to the emergency room with left groin HD catheter problem and missed hemodialysis x 2.  He had potassium of 6.5.  Patient was seen by vascular surgery, left groin catheter exchanged over wire with successful hemodialysis overnight.  Patient has exhausted all dialysis access.  Vascular surgery will schedule follow-up to discuss more sophisticated access.  Patient is going home today.  Next hemodialysis on Saturday.  Patient is already on vancomycin with dialysis as well as oral metronidazole for recent osteomyelitis. Wound was inspected and dressed, he had split open skin ages with nonfunctioning silk sutures that were removed.  Does have follow-up with podiatry next week.   Discharge Diagnoses:  Principal Problem:   Hyperkalemia Active Problems:   Essential hypertension   Paroxysmal atrial fibrillation [name]    Discharge Instructions  Discharge Instructions     Diet - low sodium heart healthy   Complete by: As directed    Discharge wound care:   Complete by: As directed    Wet to dry dressing as you have been doing   Increase activity slowly   Complete by: As directed       Allergies as of 01/01/2023   No Known Allergies      Medication List     TAKE these medications    apixaban 5 MG Tabs tablet Commonly known as:  ELIQUIS Take 5 mg by mouth 2 (two) times daily.   atorvastatin 40 MG tablet Commonly known as: LIPITOR Take 1 tablet (40 mg total) by mouth daily.   CALCIUM 600 + D PO Take 2 tablets by mouth daily.   Dialyvite/Zinc Tabs Take 1 tablet by mouth daily.   ferric citrate 1 GM 210 MG(Fe) tablet Commonly known as: AURYXIA Take 210 mg by mouth See admin instructions. Take 3 tablets 3 times daily and 1 tablet twice daily with meals   metroNIDAZOLE 500 MG tablet Commonly known as: FLAGYL Take 1 tablet (500 mg total) by mouth every 12 (twelve) hours for 24 days.   midodrine 5 MG tablet Commonly known as: PROAMATINE Take 3 tablets (15 mg total) by mouth every 8 (eight) hours.   oxybutynin 5 MG tablet Commonly known as: DITROPAN Take 1 tablet (5 mg total) by mouth 3 (three) times daily.   vancomycin 1250 MG/250ML Soln Commonly known as: VANCOREADY Inject 250 mLs (1,250 mg total) into the vein Every Tuesday,Thursday,and Saturday with dialysis for 22 days.               Discharge Care Instructions  (From admission, onward)           Start     Ordered   01/01/23 0000  Discharge wound care:       Comments: Wet to dry dressing as you have been doing   01/01/23 1259            No Known Allergies  Consultations: Nephrology Vascular   Procedures/Studies: DG C-Arm 1-60 Min-No Report  Result Date: 12/31/2022 Fluoroscopy  was utilized by the requesting physician.  No radiographic interpretation.   DG Foot Complete Right  Result Date: 12/12/2022 CLINICAL DATA:  Osteomyelitis of right foot.  PACU wound check. EXAM: RIGHT FOOT COMPLETE - 3+ VIEW COMPARISON:  Right foot radiographs 12/08/2022; MRI right forefoot 11/26/2022 FINDINGS: Interval amputation of the first ray to the level of the mid shaft. There is subcutaneous air within the postsurgical distal surgical site, likely within normal limits recent surgery. The first metatarsal amputation margin is sharp. Mild  plantar calcaneal heel spur. IMPRESSION: Interval amputation of the first ray to the level of the mid shaft. No complication is seen. Electronically Signed   By: [name] M.D.   On: 12/12/2022 11:29   VAS [place] ABI WITH/WO TBI  Result Date: 12/10/2022  LOWER EXTREMITY DOPPLER STUDY Patient Name:  [name]  Date of Exam:   12/10/2022 Medical Rec #: [id number]          Accession #:    [id number] Date of Birth: Apr 01, 1976          Patient Gender: M Patient Age:   69 years Exam Location:  [facility] Procedure:      VAS [place] ABI WITH/WO TBI Referring Phys: [name] --------------------------------------------------------------------------------  Indications: Cellultis/RT foot infection, LT great toe ulceration High Risk Factors: Hypertension, hyperlipidemia, past history of smoking. Other Factors: ESRD (HD- LLE), CHF, Afib.  Limitations: Today's exam was limited due to bandages and skin texture              (thickened, hard), venous interference. Comparison Study: Previous exam on 07/15/2022 was WNL Performing Technologist: [name] RVT, RDMS  Examination Guidelines: A complete evaluation includes at minimum, Doppler waveform signals and systolic blood pressure reading at the level of bilateral brachial, anterior tibial, and posterior tibial arteries, when vessel segments are accessible. Bilateral testing is considered an integral part of a complete examination. Photoelectric Plethysmograph (PPG) waveforms and toe systolic pressure readings are included as required and additional duplex testing as needed. Limited examinations for reoccurring indications may be performed as noted.  ABI Findings: +--------+------------------+-----+-----------+-----------------+ Right   Rt Pressure (mmHg)IndexWaveform   Comment           +--------+------------------+-----+-----------+-----------------+ Brachial105                    triphasic                     +--------+------------------+-----+-----------+-----------------+ PTA     111               1.06 multiphasic                  +--------+------------------+-----+-----------+-----------------+ DP      104               0.99 triphasic  audibly triphasic +--------+------------------+-----+-----------+-----------------+ +---------+------------------+-----+---------+-----------------+ Left     Lt Pressure (mmHg)IndexWaveform Comment           +---------+------------------+-----+---------+-----------------+ Brachial 84                     triphasic                  +---------+------------------+-----+---------+-----------------+ PTA      101               0.96 triphasic                  +---------+------------------+-----+---------+-----------------+ DP  97                0.92 triphasicaudibly triphasic +---------+------------------+-----+---------+-----------------+ Great Toe120               1.14 Normal                     +---------+------------------+-----+---------+-----------------+ +-------+-----------+-----------+------------+------------+ ABI/TBIToday's ABIToday's TBIPrevious ABIPrevious TBI +-------+-----------+-----------+------------+------------+ Right  1.06       bandaged   1.18                     +-------+-----------+-----------+------------+------------+ Left   0.96       1.14       0.99                     +-------+-----------+-----------+------------+------------+  Very difficult obtaining doppler signals due to venous interference.  Summary: Right: Resting right ankle-brachial index is within normal range. Unable to obtain TBI due to wound/bandage. Left: Resting left ankle-brachial index is within normal range. The left toe-brachial index is normal. Great toe too large for cuff, second toe used. *See table(s) above for measurements and observations.  Electronically signed by Coral Else MD on 12/10/2022 at 9:19:21 PM.    Final    DG  Foot 2 Views Right  Result Date: 12/08/2022 CLINICAL DATA:  Diabetic foot ulcer EXAM: RIGHT FOOT - 2 VIEW COMPARISON:  MRI 12/06/2022, radiograph 12/06/2022 potentially due to surgical change if there was intervention here, otherwise gas-forming infection FINDINGS: No fracture or malalignment. Vascular calcifications. Moderate plantar calcaneal spur. Soft tissue edema. Mild hallux valgus deformity first MTP joint. No obvious osseous destructive change. Small gas within the soft tissues at the level of first MTP but also gas collection medial and dorsal aspect at the level of the midfoot. IMPRESSION: 1. No definite acute osseous abnormality. 2. Diffuse soft tissue edema. Small amount of gas at the level of first MTP presumably postsurgical. Additional foci of gas collection medial and dorsal aspect of the midfoot, possible postoperative gas though slightly distant from reported surgical site at first MTP and would correlate with the patient's physical exam. Electronically Signed   By: Jasmine Pang M.D.   On: 12/08/2022 22:06   ECHOCARDIOGRAM COMPLETE  Result Date: 12/07/2022    ECHOCARDIOGRAM REPORT   Patient Name:   WASIF SIMONICH Date of Exam: 12/07/2022 Medical Rec #:  [id number]         Height:       74.0 in Accession #:    [id number]        Weight:       312.6 lb Date of Birth:  10/06/1975         BSA:          2.628 m Patient Age:    47 years          BP:           82/52 mmHg Patient Gender: M                 HR:           90 bpm. Exam Location:  Inpatient Procedure: 2D Echo, Color Doppler and Cardiac Doppler Indications:    Fever  History:        Patient has prior history of Echocardiogram examinations, most                 recent 03/21/2020. CHF, Arrythmias:Atrial Fibrillation; Risk  Factors:Hypertension, Dyslipidemia and Sleep Apnea.  Sonographer:    Irving Burton Senior RDCS Referring Phys: [id number] Josephine Igo  Sonographer Comments: Technically difficult due to patient body habitus.  IMPRESSIONS  1. Left ventricular ejection fraction, by estimation, is 55 to 60%. The left ventricle has normal function. The left ventricle has no regional wall motion abnormalities. Left ventricular diastolic parameters were normal.  2. Right ventricular systolic function was not well visualized. The right ventricular size is normal. Tricuspid regurgitation signal is inadequate for assessing PA pressure.  3. The mitral valve is grossly normal. No evidence of mitral valve regurgitation. No evidence of mitral stenosis.  4. The aortic valve was not well visualized. Aortic valve regurgitation is not visualized. No aortic stenosis is present.  5. The inferior vena cava is normal in size with greater than 50% respiratory variability, suggesting right atrial pressure of 3 mmHg. FINDINGS  Left Ventricle: Left ventricular ejection fraction, by estimation, is 55 to 60%. The left ventricle has normal function. The left ventricle has no regional wall motion abnormalities. The left ventricular internal cavity size was normal in size. There is  no left ventricular hypertrophy. Left ventricular diastolic parameters were normal. Right Ventricle: The right ventricular size is normal. No increase in right ventricular wall thickness. Right ventricular systolic function was not well visualized. Tricuspid regurgitation signal is inadequate for assessing PA pressure. Left Atrium: Left atrial size was normal in size. Right Atrium: Right atrial size was normal in size. Pericardium: There is no evidence of pericardial effusion. Mitral Valve: The mitral valve is grossly normal. No evidence of mitral valve regurgitation. No evidence of mitral valve stenosis. Tricuspid Valve: The tricuspid valve is grossly normal. Tricuspid valve regurgitation is not demonstrated. No evidence of tricuspid stenosis. Aortic Valve: The aortic valve was not well visualized. Aortic valve regurgitation is not visualized. No aortic stenosis is present. Pulmonic  Valve: The pulmonic valve was not well visualized. Pulmonic valve regurgitation is not visualized. No evidence of pulmonic stenosis. Aorta: The aortic root and ascending aorta are structurally normal, with no evidence of dilitation. Venous: The inferior vena cava is normal in size with greater than 50% respiratory variability, suggesting right atrial pressure of 3 mmHg. IAS/Shunts: No atrial level shunt detected by color flow Doppler.  LEFT VENTRICLE PLAX 2D LVIDd:         3.70 cm   Diastology LVIDs:         2.70 cm   LV e' medial:    7.29 cm/s LV PW:         1.00 cm   LV E/e' medial:  9.6 LV IVS:        0.90 cm   LV e' lateral:   9.36 cm/s LVOT diam:     2.30 cm   LV E/e' lateral: 7.5 LV SV:         71 LV SV Index:   27 LVOT Area:     4.15 cm  RIGHT VENTRICLE RV S prime:     8.70 cm/s TAPSE (M-mode): 2.0 cm LEFT ATRIUM             Index        RIGHT ATRIUM           Index LA diam:        3.00 cm 1.14 cm/m   RA Area:     13.40 cm LA Vol (A2C):   33.5 ml 12.75 ml/m  RA Volume:   29.10 ml  11.07 ml/m LA  Vol (A4C):   41.2 ml 15.68 ml/m LA Biplane Vol: 38.6 ml 14.69 ml/m  AORTIC VALVE LVOT Vmax:   99.50 cm/s LVOT Vmean:  70.700 cm/s LVOT VTI:    0.170 m  AORTA Ao Root diam: 3.20 cm Ao Asc diam:  3.10 cm MITRAL VALVE MV Area (PHT): 3.87 cm    SHUNTS MV Decel Time: 196 msec    Systemic VTI:  0.17 m MV E velocity: 70.00 cm/s  Systemic Diam: 2.30 cm MV A velocity: 62.90 cm/s MV E/A ratio:  1.11 Vishnu Priya Mallipeddi Electronically signed by Winfield Rast Mallipeddi Signature Date/Time: 12/07/2022/4:06:07 PM    Final    DG Chest 1 View  Result Date: 12/07/2022 CLINICAL DATA:  47 year old male history of sepsis. EXAM: CHEST  1 VIEW COMPARISON:  Chest x-ray 12/06/2022. FINDINGS: Lung volumes are normal. No consolidative airspace disease. No pleural effusions. No pneumothorax. No pulmonary nodule or mass noted. Pulmonary vasculature and the cardiomediastinal silhouette are within normal limits. Atherosclerosis in  the thoracic aorta. IMPRESSION: 1.  No radiographic evidence of acute cardiopulmonary disease. 2. Aortic atherosclerosis. Electronically Signed   By: Trudie Reed M.D.   On: 12/07/2022 05:26   MR FOOT RIGHT WO CONTRAST  Result Date: 12/06/2022 CLINICAL DATA:  End-stage renal disease, congestive heart failure with worsening right foot pain. Ulcer drainage. Fever. Patient noticed small ulcer on plantar right foot 2 months ago with intermittent 10 purulent discharge. 1 week ago patient noticed a callus develop covering the ulcer and use pain nail cutter to remove the callus with copious amounts of pus coming out. 2 days ago patient started to feel severe pain of right foot extending to mid shin. Elevated white blood cell count. Evaluate for osteomyelitis. EXAM: MRI OF THE RIGHT FOREFOOT WITHOUT CONTRAST TECHNIQUE: Multiplanar, multisequence MR imaging of the right foot radiographs 12/06/2022 was performed. No intravenous contrast was administered. COMPARISON:  None Available. FINDINGS: Bones/Joint/Cartilage Mild to moderate hallux valgus. Moderate great toe metatarsophalangeal cartilage thinning and peripheral osteophytosis. Mild great toe metatarsophalangeal joint effusion. No cortical erosion or marrow edema is seen to indicate MRI evidence of acute osteomyelitis. Ligaments The great toe metatarsophalangeal and interphalangeal collateral ligaments appear intact. The Lisfranc ligament complex is intact. Muscles and Tendons There is diffuse edema seen throughout the plantar foot musculature. Soft tissues There is mild irregularity of the plantar skin at the level of the great toe metatarsophalangeal joint (sagittal series 9, image 10 and axial series 6, image 35) likely representing a small ulcer/wound. Deep to this, there is decreased T1 increased T2 signal edema throughout the plantar great toe subcutaneous fat that also extends throughout the subcutaneous fat medial to the great toe and slightly superomedial  to the great toe. There are tiny possibly communicating regions of coalescing fluid seen measuring up to 4 mm in craniocaudal thickness within the subcutaneous fat bordering the dorsal medial base of the proximal phalanx of the great toe (axial series 6, image 33 and coronal series 7, image 19) and measuring up to 5 mm in craniocaudal thickness dorsal medial to the majority of the shaft of the proximal phalanx of the great toe (coronal series 7 images 21 through 25 and axial series 6 images 34 through 41). These are suspicious for small abscesses. There is diffuse moderate medial greater than lateral great toe and medial greater than lateral dorsal midfoot subcutaneous fat edema and swelling. IMPRESSION: 1. Small plantar skin ulcer at the level of the great toe metatarsophalangeal joint with associated soft tissue  edema and swelling about the medial greater than lateral aspects of the great toe and medial greater than lateral aspects of the midfoot consistent with cellulitis. There are regions of coalescing fluid medial to the great toe metatarsophalangeal joints and dorsal medial to the length of the proximal phalanx of the great toe suspicious for abscesses. 2. No cortical erosion or marrow edema is seen to indicate MRI evidence of acute osteomyelitis. Electronically Signed   By: [name] M.D.   On: 12/06/2022 20:14   DG Shoulder Right  Result Date: 12/06/2022 CLINICAL DATA:  pain and possible infection EXAM: RIGHT SHOULDER - 2+ VIEW COMPARISON:  None Available. FINDINGS: Borderline widening of the [facility] joint. No fracture or dislocation. Normal mineralization. No significant osseous degenerative change. IMPRESSION: Borderline widening of the [facility] joint. No fracture or other acute findings. Electronically Signed   By: Corlis Leak M.D.   On: 12/06/2022 11:42   DG Foot Complete Right  Result Date: 12/06/2022 CLINICAL DATA:  pain and possible infection EXAM: RIGHT FOOT COMPLETE - 3+ VIEW COMPARISON:  None  Available. FINDINGS: No fracture or dislocation. Normal mineralization. Mild hallux valgus deformity. Small calcaneal spurs. No subcutaneous gas or radiodense foreign body. IMPRESSION: 1. No acute findings. 2. Mild hallux valgus. Electronically Signed   By: Corlis Leak M.D.   On: 12/06/2022 11:37   DG Chest 2 View  Result Date: 12/06/2022 CLINICAL DATA:  95284 Infection 13244 EXAM: CHEST - 2 VIEW COMPARISON:  02/15/2021 FINDINGS: Low lung volumes with some crowding of bronchovascular structures in the lung bases. No focal infiltrate or overt edema. Heart size and mediastinal contours are within normal limits. Aortic Atherosclerosis (ICD10-170.0). No effusion. Visualized bones unremarkable. IMPRESSION: Low lung volumes. No acute findings. Electronically Signed   By: Corlis Leak M.D.   On: 12/06/2022 11:35   (Echo, Carotid, EGD, Colonoscopy, ERCP)    Subjective: Patient seen and examined.  Received hemodialysis overnight.  Denies any complaints.  Eager to go home.   Discharge Exam: Vitals:   01/01/23 0509 01/01/23 0941  BP: 92/67 110/77  Pulse: 85 70  Resp: 16 17  Temp: 98.3 F (36.8 C) 97.9 F (36.6 C)  SpO2: 97% 100%   Vitals:   01/01/23 0416 01/01/23 0424 01/01/23 0509 01/01/23 0941  BP: 107/78  92/67 110/77  Pulse: 78  85 70  Resp: 14  16 17   Temp:   98.3 F (36.8 C) 97.9 F (36.6 C)  TempSrc:   Oral Oral  SpO2: 99%  97% 100%  Weight:  (!) 139.8 kg    Height:        General: Pt is alert, awake, not in acute distress Cardiovascular: RRR, S1/S2 +, no rubs, no gallops Respiratory: CTA bilaterally, no wheezing, no rhonchi Abdominal: Soft, NT, ND, bowel sounds + Extremities:  Left groin dialysis catheter intact. Right foot with recent surgical amputation     The results of significant diagnostics from this hospitalization (including imaging, microbiology, ancillary and laboratory) are listed below for reference.     Microbiology: No results found for this or any previous  visit (from the past 240 hour(s)).   Labs: BNP (last 3 results) No results for input(s): "BNP" in the last 8760 hours. Basic Metabolic Panel: Recent Labs  Lab 12/31/22 1302 12/31/22 1326 12/31/22 1759  NA 136 135 137  K 6.6* 6.6* 5.6*  CL 96* 98 99  CO2 25  --   --   GLUCOSE 93 93 91  BUN 91* 86* 83*  CREATININE 24.70* >18.00* >18.00*  CALCIUM 9.6  --   --    Liver Function Tests: Recent Labs  Lab 12/31/22 1302  AST 11*  ALT 9  ALKPHOS 56  BILITOT 0.7  PROT 7.5  ALBUMIN 3.6   No results for input(s): "LIPASE", "AMYLASE" in the last 168 hours. No results for input(s): "AMMONIA" in the last 168 hours. CBC: Recent Labs  Lab 12/31/22 1302 12/31/22 1326 12/31/22 1759  WBC 5.6  --   --   NEUTROABS 3.2  --   --   HGB 8.9* 9.5* 11.2*  HCT 28.3* 28.0* 33.0*  MCV 94.0  --   --   PLT 262  --   --    Cardiac Enzymes: No results for input(s): "CKTOTAL", "CKMB", "CKMBINDEX", "TROPONINI" in the last 168 hours. BNP: Invalid input(s): "POCBNP" CBG: No results for input(s): "GLUCAP" in the last 168 hours. D-Dimer No results for input(s): "DDIMER" in the last 72 hours. Hgb A1c No results for input(s): "HGBA1C" in the last 72 hours. Lipid Profile No results for input(s): "CHOL", "HDL", "LDLCALC", "TRIG", "CHOLHDL", "LDLDIRECT" in the last 72 hours. Thyroid function studies No results for input(s): "TSH", "T4TOTAL", "T3FREE", "THYROIDAB" in the last 72 hours.  Invalid input(s): "FREET3" Anemia work up No results for input(s): "VITAMINB12", "FOLATE", "FERRITIN", "TIBC", "IRON", "RETICCTPCT" in the last 72 hours. Urinalysis    Component Value Date/Time   COLORURINE STRAW (A) 05/10/2018 1811   APPEARANCEUR CLEAR 05/10/2018 1811   LABSPEC 1.012 05/10/2018 1811   PHURINE 7.0 05/10/2018 1811   GLUCOSEU 150 (A) 05/10/2018 1811   HGBUR SMALL (A) 05/10/2018 1811   BILIRUBINUR NEGATIVE 11/03/2018 1515   KETONESUR NEGATIVE 05/10/2018 1811   PROTEINUR Positive (A) 11/03/2018  1515   PROTEINUR >=300 (A) 05/10/2018 1811   UROBILINOGEN 0.2 11/03/2018 1515   NITRITE NEGATIVE 11/03/2018 1515   NITRITE NEGATIVE 05/10/2018 1811   LEUKOCYTESUR Small (1+) (A) 11/03/2018 1515   Sepsis Labs Recent Labs  Lab 12/31/22 1302  WBC 5.6   Microbiology No results found for this or any previous visit (from the past 240 hour(s)).   Time coordinating discharge: 31 minutes  SIGNED:   [name], MD  [facility] 01/01/2023, 1:00 PM

## 2023-01-01 NOTE — Progress Notes (Signed)
Pharmacy Antibiotic Note  [name] is a 47 y.o. male for which pharmacy has been consulted for vancomycin dosing for  osteomyelitis .  Patient with a history of ESRD on HD TTS, AF on eliquis, carotid AV fistula, OSA, chronic rt foot osteo MRSA. Patient presenting with left groin HD catheter problem and missed HD x 2.  Patient receiving Vancomycin and flagyl prior to arrival. Vancomycin 1250mg  being given in dialysis. Patient states that HD did not give vancomycin yesterday 2/2 high level (was not dialyzed yesterday). Patient states last dose given was 7/18.   Plan was for vancomycin/flagyl until 8/2.  Pt had HD cath exchange 7/24 and got HD that night. PreHD vanc level was 28 so post level should be around 15. We will give a smaller dose to supplement level this AM. Not sure if he will go to HD again today.   Plan: Vanc 750mg  IV x1 F/u HD plan for further dosing  Height: 6\' 2"  (188 cm) Weight: (!) 139.8 kg (308 lb 3.3 oz) IBW/kg (Calculated) : 82.2  Temp (24hrs), Avg:98 F (36.7 C), Min:97.5 F (36.4 C), Max:98.8 F (37.1 C)  Recent Labs  Lab 12/31/22 1302 12/31/22 1326 12/31/22 1759 12/31/22 1838  WBC 5.6  --   --   --   CREATININE 24.70* >18.00* >18.00*  --   VANCORANDOM  --   --   --  28    CrCl cannot be calculated (This lab value cannot be used to calculate CrCl because it is not a number: >18.00).    No Known Allergies  Ulyses Southward, PharmD, BCIDP, AAHIVP, CPP Infectious Disease Pharmacist 01/01/2023 8:55 AM

## 2023-01-01 NOTE — Progress Notes (Signed)
Removed 6 sutures but there are couple more on the top of the foot, unable to remove as it is not visible. pt is aware of it and said he has an appointment with the doctor on Aug 1st.  MD is aware, will continue to monitor.

## 2023-01-01 NOTE — Progress Notes (Signed)
ANTICOAGULATION CONSULT NOTE - Initial Consult  Pharmacy Consult for Heparin>>apixaban Indication: atrial fibrillation  No Known Allergies  Patient Measurements: Height: 6\' 2"  (188 cm) Weight: (!) 139.8 kg (308 lb 3.3 oz) IBW/kg (Calculated) : 82.2 Heparin Dosing Weight: 116 kg  Vital Signs: Temp: 97.9 F (36.6 C) (07/25 0941) Temp Source: Oral (07/25 0941) BP: 110/77 (07/25 0941) Pulse Rate: 70 (07/25 0941)  Labs: Recent Labs    12/31/22 1302 12/31/22 1326 12/31/22 1759 12/31/22 1809  HGB 8.9* 9.5* 11.2*  --   HCT 28.3* 28.0* 33.0*  --   PLT 262  --   --   --   APTT  --   --   --  42*  HEPARINUNFRC  --   --   --  >1.10*  CREATININE 24.70* >18.00* >18.00*  --     CrCl cannot be calculated (This lab value cannot be used to calculate CrCl because it is not a number: >18.00).   Medical History: Past Medical History:  Diagnosis Date   Acute respiratory failure with hypoxia (HCC) 11/30/2018   Anemia    ESRD   ESRD on hemodialysis (HCC) 06/07/2011   TTS [facility]. Started HD in 2006, got transplant in 2014 lasted until Dec 2019 then went back on HD.  Has L thigh AVG as of Jun 2020.  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    History of blood transfusion    Hypertension    03/19/22- has not had high blood pressure in 5 years.   Morbid obesity (HCC)    Paroxysmal atrial fibrillation (HCC)    Pre-diabetes    no meds   Sepsis (HCC) 11/30/2018   Sleep apnea    lost weight, does not use CPAP    Medications:  Medications Prior to Admission  Medication Sig Dispense Refill Last Dose   apixaban (ELIQUIS) 5 MG TABS tablet Take 5 mg by mouth 2 (two) times daily.   12/31/2022 at 9.30 am   atorvastatin (LIPITOR) 40 MG tablet Take 1 tablet (40 mg total) by mouth daily. 90 tablet 3 12/31/2022   B Complex-C-Zn-Folic Acid (DIALYVITE/ZINC) TABS Take 1 tablet by mouth daily.   12/31/2022   Calcium Carb-Cholecalciferol (CALCIUM 600 + D PO) Take 2 tablets by mouth daily.    12/29/2022   ferric citrate (AURYXIA) 1 GM 210 MG(Fe) tablet Take 210 mg by mouth See admin instructions. Take 3 tablets 3 times daily and 1 tablet twice daily with meals   12/31/2022   metroNIDAZOLE (FLAGYL) 500 MG tablet Take 1 tablet (500 mg total) by mouth every 12 (twelve) hours for 24 days. 48 tablet 0 12/31/2022   midodrine (PROAMATINE) 5 MG tablet Take 3 tablets (15 mg total) by mouth every 8 (eight) hours. 270 tablet 0 12/31/2022   oxybutynin (DITROPAN) 5 MG tablet Take 1 tablet (5 mg total) by mouth 3 (three) times daily. 90 tablet 0 12/31/2022   vancomycin (VANCOREADY) 1250 MG/250ML SOLN Inject 250 mLs (1,250 mg total) into the vein Every Tuesday,Thursday,and Saturday with dialysis for 22 days.   12/25/2022   Scheduled:   apixaban  5 mg Oral BID   atorvastatin  40 mg Oral Daily   Chlorhexidine Gluconate Cloth  6 each Topical Q0600   ferric citrate  630 mg Oral TID WC   metroNIDAZOLE  500 mg Oral Q12H   midodrine  15 mg Oral Q8H   oxybutynin  5 mg Oral TID   sodium zirconium cyclosilicate  10 g  Oral TID   vancomycin variable dose per unstable renal function (pharmacist dosing)   Does not apply See admin instructions   Infusions:   vancomycin     PRN:   Assessment: 47 yom with a history of ESRD on HD TTS, AF on eliquis, carotid AV fistula, OSA, chronic rt foot osteo MRSA. Patient is presenting with left groin HD catheter problem and missed HD x 2. Heparin per pharmacy consult placed for atrial fibrillation.  Patient is on apixaban prior to arrival. Last dose 7/24 0830. Will require aPTT monitoring due to likely falsely high anti-Xa level secondary to DOAC use.  S/p HD cath exchange on 7/24. Ok to resume apixaban per Dr Jerral Ralph K.   Goal of Therapy:  Monitor platelets by anticoagulation protocol: Yes   Plan:  Dc heparin Apixaban 5mg  PO BID Rx will follow peripherally  Ulyses Southward, PharmD, BCIDP, AAHIVP, CPP Infectious Disease Pharmacist 01/01/2023 9:55 AM

## 2023-01-01 NOTE — Care Management Obs Status (Signed)
MEDICARE OBSERVATION STATUS NOTIFICATION   Patient Details  Name: [name] MRN: [id number] Date of Birth: Apr 20, 1976   Medicare Observation Status Notification Given:  Yes    Tom-Johnson, Hershal Coria, RN 01/01/2023, 1:24 PM

## 2023-01-01 NOTE — Progress Notes (Signed)
D/C order noted. Contacted [facility] to advise clinic of pt's d/c today and that pt should resume care on Saturday.   Olivia Canter Renal Navigator [phone]

## 2023-01-01 NOTE — Progress Notes (Signed)
Subjective: Seen and examined in room, tolerated 5 L uf HD earlier this a.m./ finished hd tx  around 430am with left femoral perma cath. With out reported problems .  Denies shortness of breath /chest pain.  Feels okay for next  HD treatment Saturday as an outpatient.  Has Lokelma to  take at home       Objective Vital signs in last 24 hours: Vitals:   01/01/23 0416 01/01/23 0424 01/01/23 0509 01/01/23 0941  BP: 107/78  92/67 110/77  Pulse: 78  85 70  Resp: 14  16 17   Temp:   98.3 F (36.8 C) 97.9 F (36.6 C)  TempSrc:   Oral Oral  SpO2: 99%  97% 100%  Weight:  (!) 139.8 kg    Height:       Weight change:   Physical Exam: General: Obese adult male, NAd  Heart: RRR, no mrg  Lungs: CTA  non labored breathing  Abdomen: Nabs, soft , Nt, nd  Extremities: Bipedal  trace edema  Dialysis Access: L fem TDC  dressing dry / clean      Home meds include - apixaban, atorvastatin, b complex, calc+ D, ferric citrate 3 ac, midodrine 15mg  tid, oxybutynin    OP HD: SW TTS  4.5h   500/500   140kg   2/2 bath   L fem TDC    Heparin 5000 + 2500 midrun - parsabiv 7.5mg  IV three times per week - mircera 50 mcg IV q 2 wks, last  - vancomycin 1250mg  IV q hd through 01/08/23   Problem/Plan: Malfunctioning HD catheter - last full HD 7/16, creat > 18.0 here. Pt has complicated access history and has been followed by [facility] and [facility].   VVS consulted  and [facility] . Dr De Hollingshead  non functioning  L Fem TDC with new left femoral tunneled dialysis catheter .  New HD [facility]  functioned without reported problems  early AM HD . Appreciate Dr Lenell Antu  help Hyperkalemia - K+  6.6 here. Improved to 5.6  with meds  ,, admit EKG w/o acute changes, QRS no change from baseline (90- 110 msec). Repeat K this am pend . Post hd , he has lokema  to take at home  ESRD - on HD TTS.  HD  today earlier on schedule , vol okay for next HD As OP HD  Sat .  HTN/ volume - BP's wnl,Using Midodrine  pre hd  not on any HTN meds at  home. Yest Pt says he is 7kg up, and had no SOB or swelling at this time. 5 L uf  hd earlier this am  Chronic hypotension - cont midodrine 15 tid  Anemia esrd - Hb 9.5, last esa was on 6/11. Repeat hgb 11.2 .  MBD ckd - CCa is > 10.5, not getting any vdra or Ca containing binders.  phos 11.5 cont auryxia as binder while here.  Ho Chronic right foot osteomyelitis = continued  Vancomycin on hd /Po flagyl = as noted pharmacy consult  , I will inform OP_ [facility] of Vancomycin recommended dosing and continue weekly Vanco levels as OP PAF - on eliquis    Appears stable  for dc form nephrology view if reck k ok   Lenny Pastel, PA-C [facility] Beeper [phone] 01/01/2023,9:42 AM  LOS: 0 days   Labs: Basic Metabolic Panel: Recent Labs  Lab 12/31/22 1302 12/31/22 1326 12/31/22 1759  NA 136 135 137  K 6.6* 6.6* 5.6*  CL 96* 98 99  CO2 25  --   --   GLUCOSE 93 93 91  BUN 91* 86* 83*  CREATININE 24.70* >18.00* >18.00*  CALCIUM 9.6  --   --    Liver Function Tests: Recent Labs  Lab 12/31/22 1302  AST 11*  ALT 9  ALKPHOS 56  BILITOT 0.7  PROT 7.5  ALBUMIN 3.6   No results for input(s): "LIPASE", "AMYLASE" in the last 168 hours. No results for input(s): "AMMONIA" in the last 168 hours. CBC: Recent Labs  Lab 12/31/22 1302 12/31/22 1326 12/31/22 1759  WBC 5.6  --   --   NEUTROABS 3.2  --   --   HGB 8.9* 9.5* 11.2*  HCT 28.3* 28.0* 33.0*  MCV 94.0  --   --   PLT 262  --   --    Cardiac Enzymes: No results for input(s): "CKTOTAL", "CKMB", "CKMBINDEX", "TROPONINI" in the last 168 hours. CBG: No results for input(s): "GLUCAP" in the last 168 hours.  Studies/Results: DG C-Arm 1-60 Min-No Report  Result Date: 12/31/2022 Fluoroscopy was utilized by the requesting physician.  No radiographic interpretation.   Medications:  heparin 1,600 Units/hr (12/31/22 2304)   vancomycin      atorvastatin  40 mg Oral Daily   Chlorhexidine Gluconate Cloth  6 each  Topical Q0600   ferric citrate  630 mg Oral TID WC   metroNIDAZOLE  500 mg Oral Q12H   midodrine  15 mg Oral Q8H   oxybutynin  5 mg Oral TID   sodium zirconium cyclosilicate  10 g Oral TID   vancomycin variable dose per unstable renal function (pharmacist dosing)   Does not apply See admin instructions

## 2023-01-01 NOTE — TOC Transition Note (Signed)
Transition of Care [facility]) - CM/SW Discharge Note   Patient Details  Name: [name] MRN: [id number] Date of Birth: 02-01-1976  [facility]) CM/SW Contact:  Tom-Johnson, Hershal Coria, RN Phone Number: 01/01/2023, 1:19 PM   Clinical Narrative:     Patient is scheduled for discharge today.  Patient active with [facility] for PT/OT disciplines. Outpatient referral, hospital f/u and discharge instructions on AVS. No TOC needs or recommendations noted. Friend, JT to transport at discharge.  No further TOC needs noted.       Final next level of care: Home/Self Care Barriers to Discharge: Barriers Resolved   Patient Goals and CMS Choice CMS Medicare.gov Compare Post Acute Care list provided to:: Patient Choice offered to / list presented to : Parent  Discharge Placement                  Patient to be transferred to facility by: Friend Name of family member notified: JT    Discharge Plan and Services Additional resources added to the After Visit Summary for                  DME Arranged: N/A DME Agency: NA                  Social Determinants of Health (SDOH) Interventions SDOH Screenings   Food Insecurity: No Food Insecurity (12/31/2022)  Housing: Patient Declined (12/31/2022)  Transportation Needs: Unmet Transportation Needs (12/31/2022)  Utilities: Not At Risk (12/31/2022)  Alcohol Screen: Low Risk  (12/29/2022)  Depression (PHQ2-9): Low Risk  (12/29/2022)  Financial Resource Strain: Low Risk  (12/29/2022)  Physical Activity: Insufficiently Active (12/29/2022)  Social Connections: Socially Isolated (12/29/2022)  Stress: No Stress Concern Present (12/29/2022)  Tobacco Use: Medium Risk (12/31/2022)  Health Literacy: Adequate Health Literacy (12/29/2022)     Readmission Risk Interventions    12/16/2022    2:42 PM  Readmission Risk Prevention Plan  Transportation Screening Complete  PCP or Specialist Appt within 3-5 Days Complete  HRI  or Home Care Consult Complete  Social Work Consult for Recovery Care Planning/Counseling Complete  Palliative Care Screening Not Applicable  Medication Review Oceanographer) Complete

## 2023-01-01 NOTE — Progress Notes (Signed)
[name] to be discharged Home per MD order. Discussed prescriptions and follow up appointments with the patient. Medication list explained in detail. Patient verbalized understanding.  Skin clean, dry and intact without evidence of skin break down, no evidence of skin tears noted. IV catheter discontinued intact. Site without signs and symptoms of complications. Dressing and pressure applied. Pt denies pain at the site currently. No complaints noted.  Patient free of lines, drains, and wounds.   An After Visit Summary (AVS) was printed and given to the patient. Patient escorted via wheelchair, and discharged home via private auto.  Arvilla Meres, RN

## 2023-01-02 ENCOUNTER — Telehealth: Payer: Self-pay | Admitting: Family Medicine

## 2023-01-02 NOTE — Telephone Encounter (Signed)
Noted. Dm/cma  

## 2023-01-02 NOTE — Telephone Encounter (Signed)
[name] from [facility] called [phone] and said they will not be able to open start until 7.29.24

## 2023-01-03 DIAGNOSIS — Z992 Dependence on renal dialysis: Secondary | ICD-10-CM | POA: Diagnosis not present

## 2023-01-03 DIAGNOSIS — N2581 Secondary hyperparathyroidism of renal origin: Secondary | ICD-10-CM | POA: Diagnosis not present

## 2023-01-03 DIAGNOSIS — N186 End stage renal disease: Secondary | ICD-10-CM | POA: Diagnosis not present

## 2023-01-06 DIAGNOSIS — Z992 Dependence on renal dialysis: Secondary | ICD-10-CM | POA: Diagnosis not present

## 2023-01-06 DIAGNOSIS — N186 End stage renal disease: Secondary | ICD-10-CM | POA: Diagnosis not present

## 2023-01-06 DIAGNOSIS — N2581 Secondary hyperparathyroidism of renal origin: Secondary | ICD-10-CM | POA: Diagnosis not present

## 2023-01-07 DIAGNOSIS — N186 End stage renal disease: Secondary | ICD-10-CM | POA: Diagnosis not present

## 2023-01-07 DIAGNOSIS — N041 Nephrotic syndrome with focal and segmental glomerular lesions: Secondary | ICD-10-CM | POA: Diagnosis not present

## 2023-01-07 DIAGNOSIS — Z992 Dependence on renal dialysis: Secondary | ICD-10-CM | POA: Diagnosis not present

## 2023-01-08 DIAGNOSIS — N186 End stage renal disease: Secondary | ICD-10-CM | POA: Diagnosis not present

## 2023-01-08 DIAGNOSIS — N2581 Secondary hyperparathyroidism of renal origin: Secondary | ICD-10-CM | POA: Diagnosis not present

## 2023-01-08 DIAGNOSIS — Z992 Dependence on renal dialysis: Secondary | ICD-10-CM | POA: Diagnosis not present

## 2023-01-09 ENCOUNTER — Telehealth: Payer: Self-pay

## 2023-01-09 NOTE — Patient Outreach (Signed)
  Care Coordination   01/09/2023 Name: [name] MRN: [id number] DOB: 1975-12-08   Care Coordination Outreach Attempts:  An unsuccessful telephone outreach was attempted today to offer the patient information about available care coordination services.  Follow Up Plan:  Additional outreach attempts will be made to offer the patient care coordination information and services.   Encounter Outcome:  No Answer   Care Coordination Interventions:  No, not indicated    Bary Leriche, RN, MSN [facility] Care Management Care Management Coordinator Direct Line [phone]

## 2023-01-10 DIAGNOSIS — Z992 Dependence on renal dialysis: Secondary | ICD-10-CM | POA: Diagnosis not present

## 2023-01-10 DIAGNOSIS — N186 End stage renal disease: Secondary | ICD-10-CM | POA: Diagnosis not present

## 2023-01-10 DIAGNOSIS — N2581 Secondary hyperparathyroidism of renal origin: Secondary | ICD-10-CM | POA: Diagnosis not present

## 2023-01-13 ENCOUNTER — Encounter: Payer: Self-pay | Admitting: Internal Medicine

## 2023-01-13 ENCOUNTER — Ambulatory Visit (INDEPENDENT_AMBULATORY_CARE_PROVIDER_SITE_OTHER): Payer: Medicare HMO | Admitting: Internal Medicine

## 2023-01-13 ENCOUNTER — Other Ambulatory Visit: Payer: Self-pay

## 2023-01-13 VITALS — BP 110/76 | HR 73 | Temp 97.5°F | Ht 74.0 in | Wt 304.0 lb

## 2023-01-13 DIAGNOSIS — N186 End stage renal disease: Secondary | ICD-10-CM | POA: Diagnosis not present

## 2023-01-13 DIAGNOSIS — N2581 Secondary hyperparathyroidism of renal origin: Secondary | ICD-10-CM | POA: Diagnosis not present

## 2023-01-13 DIAGNOSIS — M86171 Other acute osteomyelitis, right ankle and foot: Secondary | ICD-10-CM | POA: Diagnosis not present

## 2023-01-13 DIAGNOSIS — Z992 Dependence on renal dialysis: Secondary | ICD-10-CM | POA: Diagnosis not present

## 2023-01-13 LAB — CBC WITH DIFFERENTIAL/PLATELET
Absolute Monocytes: 570 cells/uL (ref 200–950)
Basophils Absolute: 41 cells/uL (ref 0–200)
Basophils Relative: 0.9 %
Eosinophils Absolute: 262 cells/uL (ref 15–500)
Eosinophils Relative: 5.7 %
HCT: 31.3 % — ABNORMAL LOW (ref 38.5–50.0)
Hemoglobin: 10.2 g/dL — ABNORMAL LOW (ref 13.2–17.1)
Lymphs Abs: 874 cells/uL (ref 850–3900)
MCH: 28.8 pg (ref 27.0–33.0)
MCHC: 32.6 g/dL (ref 32.0–36.0)
MCV: 88.4 fL (ref 80.0–100.0)
MPV: 9.5 fL (ref 7.5–12.5)
Monocytes Relative: 12.4 %
Neutro Abs: 2852 cells/uL (ref 1500–7800)
Neutrophils Relative %: 62 %
Platelets: 189 10*3/uL (ref 140–400)
RBC: 3.54 10*6/uL — ABNORMAL LOW (ref 4.20–5.80)
RDW: 16.5 % — ABNORMAL HIGH (ref 11.0–15.0)
Total Lymphocyte: 19 %
WBC: 4.6 10*3/uL (ref 3.8–10.8)

## 2023-01-13 LAB — SEDIMENTATION RATE: Sed Rate: 55 mm/h — ABNORMAL HIGH (ref 0–15)

## 2023-01-13 NOTE — Progress Notes (Signed)
   Subjective:    Patient ID: [name], male    DOB: 1976-06-06, 47 y.o.   MRN: [id number]  HPI [name] is here for hsfu for osteomyelitis of his first ray of his right foot.  He is s/p amputation of his toe done on 12/12/22 and cultures grew out MRSA and Bacteroides.  He was put on vancomycin which he is getting with dialysis and oral metronidazole and planned for 4 weeks, which he has completed.  He has not been back to see podiatry since surgery.     Review of Systems  Constitutional:  Negative for chills and fever.  Gastrointestinal:  Negative for diarrhea and nausea.  Skin:  Negative for rash.       Objective:   Physical Exam Eyes:     General: No scleral icterus. Pulmonary:     Effort: Pulmonary effort is normal.  Musculoskeletal:     Comments: Right foot with open area from amputation, no purulence, no surrounding erythema.  No warmth.    Neurological:     Mental Status: He is alert.   SH: quit smoking 7 years ago        Assessment & Plan:

## 2023-01-13 NOTE — Assessment & Plan Note (Signed)
He is doing well from an infection standpoint and has completed post amputation antibiotics with no new concerns.  Wound is still open and has not seen podiatry yet.  Will message them.  Will check inflammatory markers here and he otherwise can return as needed.    I have personally spent 30 minutes involved in face-to-face and non-face-to-face activities for this patient on the day of the visit. Professional time spent includes the following activities: Preparing to see the patient (review of tests), Obtaining and/or reviewing separately obtained history (admission/discharge record), Performing a medically appropriate examination and/or evaluation , Ordering medications/tests/procedures, referring and communicating with other health care professionals, Documenting clinical information in the EMR, Independently interpreting results (not separately reported), Communicating results to the patient/family/caregiver, Counseling and educating the patient/family/caregiver and Care coordination (not separately reported).

## 2023-01-14 ENCOUNTER — Telehealth: Payer: Self-pay

## 2023-01-14 NOTE — Patient Outreach (Signed)
  Care Coordination   Follow Up Visit Note   01/14/2023 Name: ANSON MIRACLE MRN: [id number] DOB: July 24, 1975  Verdene Lennert Waxman is a 47 y.o. year old male who sees Rudd, Bertram Millard, MD for primary care. I spoke with  Verdene Lennert Lorino by phone today.  What matters to the patients health and wellness today?  Maintain health    Goals Addressed             This Visit's Progress    Health Maintainance       Care Coordination Interventions: Evaluation of current treatment plan related to ESRD management and patient's adherence to plan as established by provider Discussed plans with patient for ongoing care management follow up and provided patient with direct contact information for care management team  Patient with recent hospitalization with partial amputation of right 1st ray. Patient reports area healing well. No signs of infection.   Antibiotics during dialysis.  Patient has problems with dialysis shunt.  He is to go to [facility] for a graft creation to possibly his arms later this month.  Discussed signs of infection.  He verbalized understanding.              SDOH assessments and interventions completed:  Yes     Care Coordination Interventions:  Yes, provided   Follow up plan: Follow up call scheduled for September    Encounter Outcome:  Pt. Visit Completed   Bary Leriche, RN, MSN [facility] Care Management Care Management Coordinator Direct Line [phone]

## 2023-01-14 NOTE — Patient Instructions (Signed)
Visit Information  Thank you for taking time to visit with me today. Please don't hesitate to contact me if I can be of assistance to you.   Following are the goals we discussed today:   Goals Addressed             This Visit's Progress    Health Maintainance       Care Coordination Interventions: Evaluation of current treatment plan related to ESRD management and patient's adherence to plan as established by provider Discussed plans with patient for ongoing care management follow up and provided patient with direct contact information for care management team  Patient with recent hospitalization with partial amputation of right 1st ray. Patient reports area healing well. No signs of infection.   Antibiotics during dialysis.  Patient has problems with dialysis shunt.  He is to go to [facility] for a graft creation to possibly his arms later this month.  Discussed signs of infection.  He verbalized understanding.              Our next appointment is by telephone on 02/25/23 at 1100 am  Please call the care guide team at [phone] if you need to cancel or reschedule your appointment.   If you are experiencing a Mental Health or Behavioral Health Crisis or need someone to talk to, please call the Suicide and Crisis Lifeline: 988   Patient verbalizes understanding of instructions and care plan provided today and agrees to view in MyChart. Active MyChart status and patient understanding of how to access instructions and care plan via MyChart confirmed with patient.     The patient has been provided with contact information for the care management team and has been advised to call with any health related questions or concerns.   Bary Leriche, RN, MSN [facility] Care Management Care Management Coordinator Direct Line [phone]

## 2023-01-15 DIAGNOSIS — N2581 Secondary hyperparathyroidism of renal origin: Secondary | ICD-10-CM | POA: Diagnosis not present

## 2023-01-15 DIAGNOSIS — N186 End stage renal disease: Secondary | ICD-10-CM | POA: Diagnosis not present

## 2023-01-15 DIAGNOSIS — Z992 Dependence on renal dialysis: Secondary | ICD-10-CM | POA: Diagnosis not present

## 2023-01-17 DIAGNOSIS — Z992 Dependence on renal dialysis: Secondary | ICD-10-CM | POA: Diagnosis not present

## 2023-01-17 DIAGNOSIS — N186 End stage renal disease: Secondary | ICD-10-CM | POA: Diagnosis not present

## 2023-01-17 DIAGNOSIS — N2581 Secondary hyperparathyroidism of renal origin: Secondary | ICD-10-CM | POA: Diagnosis not present

## 2023-01-20 DIAGNOSIS — Z992 Dependence on renal dialysis: Secondary | ICD-10-CM | POA: Diagnosis not present

## 2023-01-20 DIAGNOSIS — N186 End stage renal disease: Secondary | ICD-10-CM | POA: Diagnosis not present

## 2023-01-20 DIAGNOSIS — N2581 Secondary hyperparathyroidism of renal origin: Secondary | ICD-10-CM | POA: Diagnosis not present

## 2023-01-22 DIAGNOSIS — N186 End stage renal disease: Secondary | ICD-10-CM | POA: Diagnosis not present

## 2023-01-22 DIAGNOSIS — N2581 Secondary hyperparathyroidism of renal origin: Secondary | ICD-10-CM | POA: Diagnosis not present

## 2023-01-22 DIAGNOSIS — Z992 Dependence on renal dialysis: Secondary | ICD-10-CM | POA: Diagnosis not present

## 2023-01-24 DIAGNOSIS — Z992 Dependence on renal dialysis: Secondary | ICD-10-CM | POA: Diagnosis not present

## 2023-01-24 DIAGNOSIS — N2581 Secondary hyperparathyroidism of renal origin: Secondary | ICD-10-CM | POA: Diagnosis not present

## 2023-01-24 DIAGNOSIS — N186 End stage renal disease: Secondary | ICD-10-CM | POA: Diagnosis not present

## 2023-01-26 ENCOUNTER — Encounter: Payer: Self-pay | Admitting: Podiatry

## 2023-01-26 ENCOUNTER — Ambulatory Visit (INDEPENDENT_AMBULATORY_CARE_PROVIDER_SITE_OTHER): Payer: Medicare HMO | Admitting: Podiatry

## 2023-01-26 DIAGNOSIS — L97512 Non-pressure chronic ulcer of other part of right foot with fat layer exposed: Secondary | ICD-10-CM

## 2023-01-26 DIAGNOSIS — T8130XA Disruption of wound, unspecified, initial encounter: Secondary | ICD-10-CM

## 2023-01-26 DIAGNOSIS — Z9889 Other specified postprocedural states: Secondary | ICD-10-CM

## 2023-01-26 DIAGNOSIS — E08621 Diabetes mellitus due to underlying condition with foot ulcer: Secondary | ICD-10-CM

## 2023-01-26 NOTE — Progress Notes (Signed)
Subjective:  Patient ID: [name], male    DOB: May 24, 1976,  MRN: [id number]  Chief Complaint  Patient presents with   Wound Check    DOS: 12/12/22 Procedure: Right partial first ray amputation  47 y.o. male returns for POV#1. Relates he never followed up because he missed any calls from [place]. Relates the wound has opened up and has been keeping betadine. Relates the sutures were removed by ID. Denies any n/v/f/c. Has finished taking antibiotics through dialysis   Review of Systems: Negative except as noted in the HPI. Denies N/V/F/Ch.  Past Medical History:  Diagnosis Date   Acute respiratory failure with hypoxia (HCC) 11/30/2018   Anemia    ESRD   ESRD on hemodialysis (HCC) 06/07/2011   TTS [facility]. Started HD in 2006, got transplant in 2014 lasted until Dec 2019 then went back on HD.  Has L thigh AVG as of Jun 2020.  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    History of blood transfusion    Hypertension    03/19/22- has not had high blood pressure in 5 years.   Morbid obesity (HCC)    Paroxysmal atrial fibrillation (HCC)    Pre-diabetes    no meds   Sepsis (HCC) 11/30/2018   Sleep apnea    lost weight, does not use CPAP    Current Outpatient Medications:    apixaban (ELIQUIS) 5 MG TABS tablet, Take 5 mg by mouth 2 (two) times daily., Disp: , Rfl:    atorvastatin (LIPITOR) 40 MG tablet, Take 1 tablet (40 mg total) by mouth daily., Disp: 90 tablet, Rfl: 3   B Complex-C-Zn-Folic Acid (DIALYVITE/ZINC) TABS, Take 1 tablet by mouth daily., Disp: , Rfl:    Calcium Carb-Cholecalciferol (CALCIUM 600 + D PO), Take 2 tablets by mouth daily., Disp: , Rfl:    ferric citrate (AURYXIA) 1 GM 210 MG(Fe) tablet, Take 210 mg by mouth See admin instructions. Take 3 tablets 3 times daily and 1 tablet twice daily with meals, Disp: , Rfl:   Social History   Tobacco Use  Smoking Status Former   Current packs/day: 0.00   Types: Cigarettes   Quit date: 11/08/2015   Years  since quitting: 7.2  Smokeless Tobacco Never    No Known Allergies Objective:  There were no vitals filed for this visit. There is no height or weight on file to calculate BMI. Constitutional Well developed. Well nourished.  Vascular Foot warm and well perfused. Capillary refill normal to all digits.   Neurologic Normal speech. Oriented to person, place, and time. Epicritic sensation to light touch grossly present bilaterally.  Dermatologic Skin healing well distally with some remiaining sutures intact proximal incision with dehiscence of about 5 cm x 2 cm with granular wound base and adequate bleeding noted.   Orthopedic: Tenderness to palpation noted about the surgical site.   Radiographs: Interval resection of partial first ray.  Assessment:   1. Post-operative state    Plan:  Patient was evaluated and treated and all questions answered.  S/p foot surgery right -Wound dehiscence noted to medial incision proximally  -Debridement as below. -Dressed with betadine, DSD. -Off-loading with surgical shoe. -No abx indicated.  -Discussed glucose control and proper protein-rich diet.  -Discussed if any worsening redness, pain, fever or chills to call or may need to report to the emergency room. Patient expressed understanding.   Procedure: Excisional Debridement of Wound Rationale: Removal of non-viable soft tissue from the wound to promote  healing.  Anesthesia: none Pre-Debridement Wound Measurements: 4.9 cm x 2.9 cm x 0.2 cm  Post-Debridement Wound Measurements: 5 cm x 2 cm x 0.3 cm  Type of Debridement: Sharp Excisional Tissue Removed: Non-viable soft tissue Depth of Debridement: subcutaneous tissue. Technique: Sharp excisional debridement to bleeding, viable wound base.  Dressing: Dry, sterile, compression dressing. Disposition: Patient tolerated procedure well. Patient to return in 2 week for follow-up.  Return in about 2 weeks (around 02/09/2023) for wound  check.    Return in about 2 weeks (around 02/09/2023) for wound check.

## 2023-01-27 ENCOUNTER — Emergency Department (HOSPITAL_COMMUNITY): Payer: Medicare HMO

## 2023-01-27 ENCOUNTER — Inpatient Hospital Stay (HOSPITAL_COMMUNITY)
Admission: EM | Admit: 2023-01-27 | Discharge: 2023-02-05 | DRG: 252 | Disposition: A | Discharge status: Home Health | Payer: Medicare HMO | Attending: Internal Medicine | Admitting: Internal Medicine

## 2023-01-27 DIAGNOSIS — Z1152 Encounter for screening for COVID-19: Secondary | ICD-10-CM

## 2023-01-27 DIAGNOSIS — T82858A Stenosis of vascular prosthetic devices, implants and grafts, initial encounter: Secondary | ICD-10-CM | POA: Diagnosis not present

## 2023-01-27 DIAGNOSIS — R6521 Severe sepsis with septic shock: Secondary | ICD-10-CM

## 2023-01-27 DIAGNOSIS — Z7901 Long term (current) use of anticoagulants: Secondary | ICD-10-CM

## 2023-01-27 DIAGNOSIS — S81801A Unspecified open wound, right lower leg, initial encounter: Secondary | ICD-10-CM | POA: Diagnosis not present

## 2023-01-27 DIAGNOSIS — E875 Hyperkalemia: Secondary | ICD-10-CM | POA: Diagnosis present

## 2023-01-27 DIAGNOSIS — Z87448 Personal history of other diseases of urinary system: Secondary | ICD-10-CM | POA: Diagnosis not present

## 2023-01-27 DIAGNOSIS — R7881 Bacteremia: Secondary | ICD-10-CM

## 2023-01-27 DIAGNOSIS — Z79899 Other long term (current) drug therapy: Secondary | ICD-10-CM

## 2023-01-27 DIAGNOSIS — I5032 Chronic diastolic (congestive) heart failure: Secondary | ICD-10-CM | POA: Diagnosis present

## 2023-01-27 DIAGNOSIS — R932 Abnormal findings on diagnostic imaging of liver and biliary tract: Secondary | ICD-10-CM | POA: Diagnosis not present

## 2023-01-27 DIAGNOSIS — Y830 Surgical operation with transplant of whole organ as the cause of abnormal reaction of the patient, or of later complication, without mention of misadventure at the time of the procedure: Secondary | ICD-10-CM | POA: Diagnosis present

## 2023-01-27 DIAGNOSIS — T8612 Kidney transplant failure: Secondary | ICD-10-CM | POA: Diagnosis present

## 2023-01-27 DIAGNOSIS — Z6841 Body Mass Index (BMI) 40.0 and over, adult: Secondary | ICD-10-CM

## 2023-01-27 DIAGNOSIS — T80211A Bloodstream infection due to central venous catheter, initial encounter: Secondary | ICD-10-CM | POA: Diagnosis not present

## 2023-01-27 DIAGNOSIS — N2581 Secondary hyperparathyroidism of renal origin: Secondary | ICD-10-CM | POA: Diagnosis not present

## 2023-01-27 DIAGNOSIS — E11621 Type 2 diabetes mellitus with foot ulcer: Secondary | ICD-10-CM | POA: Diagnosis present

## 2023-01-27 DIAGNOSIS — I9589 Other hypotension: Secondary | ICD-10-CM | POA: Diagnosis present

## 2023-01-27 DIAGNOSIS — I132 Hypertensive heart and chronic kidney disease with heart failure and with stage 5 chronic kidney disease, or end stage renal disease: Secondary | ICD-10-CM | POA: Diagnosis present

## 2023-01-27 DIAGNOSIS — R600 Localized edema: Secondary | ICD-10-CM | POA: Diagnosis not present

## 2023-01-27 DIAGNOSIS — Z5982 Transportation insecurity: Secondary | ICD-10-CM

## 2023-01-27 DIAGNOSIS — A419 Sepsis, unspecified organism: Secondary | ICD-10-CM | POA: Diagnosis present

## 2023-01-27 DIAGNOSIS — N250 Renal osteodystrophy: Secondary | ICD-10-CM | POA: Diagnosis not present

## 2023-01-27 DIAGNOSIS — I82422 Acute embolism and thrombosis of left iliac vein: Secondary | ICD-10-CM | POA: Diagnosis not present

## 2023-01-27 DIAGNOSIS — M86171 Other acute osteomyelitis, right ankle and foot: Secondary | ICD-10-CM | POA: Diagnosis not present

## 2023-01-27 DIAGNOSIS — I82220 Acute embolism and thrombosis of inferior vena cava: Secondary | ICD-10-CM | POA: Diagnosis not present

## 2023-01-27 DIAGNOSIS — T8241XA Breakdown (mechanical) of vascular dialysis catheter, initial encounter: Secondary | ICD-10-CM | POA: Diagnosis not present

## 2023-01-27 DIAGNOSIS — F4320 Adjustment disorder, unspecified: Secondary | ICD-10-CM | POA: Diagnosis present

## 2023-01-27 DIAGNOSIS — E1122 Type 2 diabetes mellitus with diabetic chronic kidney disease: Secondary | ICD-10-CM | POA: Diagnosis present

## 2023-01-27 DIAGNOSIS — A4150 Gram-negative sepsis, unspecified: Secondary | ICD-10-CM | POA: Diagnosis not present

## 2023-01-27 DIAGNOSIS — Z8739 Personal history of other diseases of the musculoskeletal system and connective tissue: Secondary | ICD-10-CM

## 2023-01-27 DIAGNOSIS — M7989 Other specified soft tissue disorders: Secondary | ICD-10-CM | POA: Diagnosis not present

## 2023-01-27 DIAGNOSIS — I871 Compression of vein: Secondary | ICD-10-CM | POA: Diagnosis not present

## 2023-01-27 DIAGNOSIS — Z8614 Personal history of Methicillin resistant Staphylococcus aureus infection: Secondary | ICD-10-CM

## 2023-01-27 DIAGNOSIS — L929 Granulomatous disorder of the skin and subcutaneous tissue, unspecified: Secondary | ICD-10-CM | POA: Diagnosis not present

## 2023-01-27 DIAGNOSIS — R000 Tachycardia, unspecified: Secondary | ICD-10-CM | POA: Diagnosis not present

## 2023-01-27 DIAGNOSIS — T8249XA Other complication of vascular dialysis catheter, initial encounter: Secondary | ICD-10-CM | POA: Diagnosis not present

## 2023-01-27 DIAGNOSIS — B964 Proteus (mirabilis) (morganii) as the cause of diseases classified elsewhere: Secondary | ICD-10-CM | POA: Diagnosis present

## 2023-01-27 DIAGNOSIS — M86471 Chronic osteomyelitis with draining sinus, right ankle and foot: Secondary | ICD-10-CM | POA: Diagnosis not present

## 2023-01-27 DIAGNOSIS — R7401 Elevation of levels of liver transaminase levels: Secondary | ICD-10-CM | POA: Diagnosis present

## 2023-01-27 DIAGNOSIS — E785 Hyperlipidemia, unspecified: Secondary | ICD-10-CM | POA: Diagnosis present

## 2023-01-27 DIAGNOSIS — L97519 Non-pressure chronic ulcer of other part of right foot with unspecified severity: Secondary | ICD-10-CM | POA: Diagnosis present

## 2023-01-27 DIAGNOSIS — F32A Depression, unspecified: Secondary | ICD-10-CM | POA: Diagnosis present

## 2023-01-27 DIAGNOSIS — Y712 Prosthetic and other implants, materials and accessory cardiovascular devices associated with adverse incidents: Secondary | ICD-10-CM | POA: Diagnosis present

## 2023-01-27 DIAGNOSIS — R609 Edema, unspecified: Secondary | ICD-10-CM | POA: Diagnosis not present

## 2023-01-27 DIAGNOSIS — S81809A Unspecified open wound, unspecified lower leg, initial encounter: Secondary | ICD-10-CM

## 2023-01-27 DIAGNOSIS — I120 Hypertensive chronic kidney disease with stage 5 chronic kidney disease or end stage renal disease: Secondary | ICD-10-CM | POA: Diagnosis not present

## 2023-01-27 DIAGNOSIS — E1169 Type 2 diabetes mellitus with other specified complication: Secondary | ICD-10-CM | POA: Diagnosis present

## 2023-01-27 DIAGNOSIS — Z823 Family history of stroke: Secondary | ICD-10-CM

## 2023-01-27 DIAGNOSIS — M869 Osteomyelitis, unspecified: Secondary | ICD-10-CM | POA: Diagnosis not present

## 2023-01-27 DIAGNOSIS — I745 Embolism and thrombosis of iliac artery: Secondary | ICD-10-CM | POA: Diagnosis not present

## 2023-01-27 DIAGNOSIS — M25474 Effusion, right foot: Secondary | ICD-10-CM | POA: Diagnosis not present

## 2023-01-27 DIAGNOSIS — Z833 Family history of diabetes mellitus: Secondary | ICD-10-CM

## 2023-01-27 DIAGNOSIS — R6520 Severe sepsis without septic shock: Secondary | ICD-10-CM | POA: Diagnosis not present

## 2023-01-27 DIAGNOSIS — E871 Hypo-osmolality and hyponatremia: Secondary | ICD-10-CM | POA: Diagnosis present

## 2023-01-27 DIAGNOSIS — Z87891 Personal history of nicotine dependence: Secondary | ICD-10-CM

## 2023-01-27 DIAGNOSIS — R0989 Other specified symptoms and signs involving the circulatory and respiratory systems: Secondary | ICD-10-CM | POA: Diagnosis not present

## 2023-01-27 DIAGNOSIS — N186 End stage renal disease: Secondary | ICD-10-CM

## 2023-01-27 DIAGNOSIS — E1165 Type 2 diabetes mellitus with hyperglycemia: Secondary | ICD-10-CM | POA: Diagnosis present

## 2023-01-27 DIAGNOSIS — B9689 Other specified bacterial agents as the cause of diseases classified elsewhere: Secondary | ICD-10-CM | POA: Diagnosis present

## 2023-01-27 DIAGNOSIS — Z992 Dependence on renal dialysis: Secondary | ICD-10-CM | POA: Diagnosis not present

## 2023-01-27 DIAGNOSIS — D631 Anemia in chronic kidney disease: Secondary | ICD-10-CM | POA: Diagnosis present

## 2023-01-27 DIAGNOSIS — B9562 Methicillin resistant Staphylococcus aureus infection as the cause of diseases classified elsewhere: Secondary | ICD-10-CM | POA: Diagnosis present

## 2023-01-27 DIAGNOSIS — R509 Fever, unspecified: Secondary | ICD-10-CM | POA: Diagnosis not present

## 2023-01-27 DIAGNOSIS — I959 Hypotension, unspecified: Secondary | ICD-10-CM | POA: Diagnosis not present

## 2023-01-27 DIAGNOSIS — E8721 Acute metabolic acidosis: Secondary | ICD-10-CM | POA: Diagnosis present

## 2023-01-27 DIAGNOSIS — Z89421 Acquired absence of other right toe(s): Secondary | ICD-10-CM

## 2023-01-27 DIAGNOSIS — I480 Paroxysmal atrial fibrillation: Secondary | ICD-10-CM | POA: Diagnosis present

## 2023-01-27 DIAGNOSIS — Z89411 Acquired absence of right great toe: Secondary | ICD-10-CM | POA: Diagnosis not present

## 2023-01-27 DIAGNOSIS — G4733 Obstructive sleep apnea (adult) (pediatric): Secondary | ICD-10-CM | POA: Diagnosis present

## 2023-01-27 DIAGNOSIS — T8740 Infection of amputation stump, unspecified extremity: Secondary | ICD-10-CM | POA: Diagnosis not present

## 2023-01-27 DIAGNOSIS — L089 Local infection of the skin and subcutaneous tissue, unspecified: Secondary | ICD-10-CM | POA: Diagnosis not present

## 2023-01-27 DIAGNOSIS — I509 Heart failure, unspecified: Secondary | ICD-10-CM | POA: Diagnosis not present

## 2023-01-27 LAB — CBC WITH DIFFERENTIAL/PLATELET
Abs Immature Granulocytes: 0 10*3/uL (ref 0.00–0.07)
Basophils Absolute: 0.1 10*3/uL (ref 0.0–0.1)
Basophils Relative: 1 %
Eosinophils Absolute: 0.2 10*3/uL (ref 0.0–0.5)
Eosinophils Relative: 3 %
HCT: 26.8 % — ABNORMAL LOW (ref 39.0–52.0)
Hemoglobin: 8.5 g/dL — ABNORMAL LOW (ref 13.0–17.0)
Lymphocytes Relative: 9 %
Lymphs Abs: 0.5 10*3/uL — ABNORMAL LOW (ref 0.7–4.0)
MCH: 27.5 pg (ref 26.0–34.0)
MCHC: 31.7 g/dL (ref 30.0–36.0)
MCV: 86.7 fL (ref 80.0–100.0)
Monocytes Absolute: 0.5 10*3/uL (ref 0.1–1.0)
Monocytes Relative: 9 %
Neutro Abs: 4.4 10*3/uL (ref 1.7–7.7)
Neutrophils Relative %: 78 %
Platelets: 214 10*3/uL (ref 150–400)
RBC: 3.09 MIL/uL — ABNORMAL LOW (ref 4.22–5.81)
RDW: 18.6 % — ABNORMAL HIGH (ref 11.5–15.5)
WBC: 5.6 10*3/uL (ref 4.0–10.5)
nRBC: 0 % (ref 0.0–0.2)
nRBC: 0 /100 WBC

## 2023-01-27 LAB — COMPREHENSIVE METABOLIC PANEL
ALT: 53 U/L — ABNORMAL HIGH (ref 0–44)
AST: 50 U/L — ABNORMAL HIGH (ref 15–41)
Albumin: 2.9 g/dL — ABNORMAL LOW (ref 3.5–5.0)
Alkaline Phosphatase: 149 U/L — ABNORMAL HIGH (ref 38–126)
Anion gap: 18 — ABNORMAL HIGH (ref 5–15)
BUN: 92 mg/dL — ABNORMAL HIGH (ref 6–20)
CO2: 19 mmol/L — ABNORMAL LOW (ref 22–32)
Calcium: 8.4 mg/dL — ABNORMAL LOW (ref 8.9–10.3)
Chloride: 95 mmol/L — ABNORMAL LOW (ref 98–111)
Creatinine, Ser: 19.73 mg/dL — ABNORMAL HIGH (ref 0.61–1.24)
GFR, Estimated: 3 mL/min — ABNORMAL LOW (ref >60)
Glucose, Bld: 95 mg/dL (ref 70–99)
Potassium: 5.6 mmol/L — ABNORMAL HIGH (ref 3.5–5.1)
Sodium: 132 mmol/L — ABNORMAL LOW (ref 135–145)
Total Bilirubin: 0.8 mg/dL (ref 0.3–1.2)
Total Protein: 7.5 g/dL (ref 6.5–8.1)

## 2023-01-27 LAB — LACTIC ACID, PLASMA
Lactic Acid, Venous: 1 mmol/L (ref 0.5–1.9)
Lactic Acid, Venous: 1 mmol/L (ref 0.5–1.9)

## 2023-01-27 LAB — PROTIME-INR
INR: 1.6 — ABNORMAL HIGH (ref 0.8–1.2)
Prothrombin Time: 19.2 seconds — ABNORMAL HIGH (ref 11.4–15.2)

## 2023-01-27 LAB — BASIC METABOLIC PANEL
Anion gap: 16 — ABNORMAL HIGH (ref 5–15)
BUN: 92 mg/dL — ABNORMAL HIGH (ref 6–20)
CO2: 21 mmol/L — ABNORMAL LOW (ref 22–32)
Calcium: 8.4 mg/dL — ABNORMAL LOW (ref 8.9–10.3)
Chloride: 95 mmol/L — ABNORMAL LOW (ref 98–111)
Creatinine, Ser: 20.17 mg/dL — ABNORMAL HIGH (ref 0.61–1.24)
GFR, Estimated: 3 mL/min — ABNORMAL LOW (ref >60)
Glucose, Bld: 106 mg/dL — ABNORMAL HIGH (ref 70–99)
Potassium: 5.5 mmol/L — ABNORMAL HIGH (ref 3.5–5.1)
Sodium: 132 mmol/L — ABNORMAL LOW (ref 135–145)

## 2023-01-27 LAB — GLUCOSE, CAPILLARY
Glucose-Capillary: 140 mg/dL — ABNORMAL HIGH (ref 70–99)
Glucose-Capillary: 83 mg/dL (ref 70–99)
Glucose-Capillary: 98 mg/dL (ref 70–99)

## 2023-01-27 LAB — CORTISOL: Cortisol, Plasma: 18.4 ug/dL

## 2023-01-27 LAB — SARS CORONAVIRUS 2 BY RT PCR: SARS Coronavirus 2 by RT PCR: NEGATIVE

## 2023-01-27 LAB — I-STAT CG4 LACTIC ACID, ED: Lactic Acid, Venous: 2.2 mmol/L (ref 0.5–1.9)

## 2023-01-27 LAB — MRSA NEXT GEN BY PCR, NASAL: MRSA by PCR Next Gen: NOT DETECTED

## 2023-01-27 LAB — PROCALCITONIN: Procalcitonin: 129.65 ng/mL

## 2023-01-27 MED ORDER — MIDODRINE HCL 5 MG PO TABS
15.0000 mg | ORAL_TABLET | Freq: Three times a day (TID) | ORAL | Status: DC
  Administered 2023-01-27 – 2023-02-05 (×21): 15 mg via ORAL
  Filled 2023-01-27 (×23): qty 3

## 2023-01-27 MED ORDER — SODIUM CHLORIDE 0.9 % IV SOLN
2.0000 g | Freq: Two times a day (BID) | INTRAVENOUS | Status: DC
  Administered 2023-01-28: 2 g via INTRAVENOUS
  Filled 2023-01-27: qty 12.5

## 2023-01-27 MED ORDER — HEPARIN SOD (PORK) LOCK FLUSH 100 UNIT/ML IV SOLN
500.0000 [IU] | Freq: Once | INTRAVENOUS | Status: AC
  Administered 2023-01-27: 500 [IU] via INTRAVENOUS
  Filled 2023-01-27: qty 5

## 2023-01-27 MED ORDER — HEPARIN SODIUM (PORCINE) 1000 UNIT/ML DIALYSIS
1000.0000 [IU] | INTRAMUSCULAR | Status: DC | PRN
  Filled 2023-01-27: qty 3

## 2023-01-27 MED ORDER — LACTATED RINGERS IV SOLN: INTRAVENOUS | Status: DC

## 2023-01-27 MED ORDER — SODIUM CHLORIDE 0.9 % IV SOLN
2.0000 g | Freq: Once | INTRAVENOUS | Status: AC
  Administered 2023-01-27: 2 g via INTRAVENOUS
  Filled 2023-01-27: qty 20

## 2023-01-27 MED ORDER — SODIUM CHLORIDE 0.9 % IV BOLUS
1000.0000 mL | Freq: Once | INTRAVENOUS | Status: AC
  Administered 2023-01-27: 1000 mL via INTRAVENOUS

## 2023-01-27 MED ORDER — PRISMASOL BGK 4/2.5 32-4-2.5 MEQ/L EC SOLN
Status: DC
  Filled 2023-01-27 (×10): qty 5000

## 2023-01-27 MED ORDER — METRONIDAZOLE 500 MG/100ML IV SOLN
500.0000 mg | Freq: Two times a day (BID) | INTRAVENOUS | Status: DC
  Administered 2023-01-27 – 2023-01-28 (×3): 500 mg via INTRAVENOUS
  Filled 2023-01-27 (×3): qty 100

## 2023-01-27 MED ORDER — CALCIUM GLUCONATE-NACL 2-0.675 GM/100ML-% IV SOLN
2.0000 g | Freq: Once | INTRAVENOUS | Status: AC
  Administered 2023-01-27: 2000 mg via INTRAVENOUS
  Filled 2023-01-27 (×2): qty 100

## 2023-01-27 MED ORDER — PRISMASOL BGK 4/2.5 32-4-2.5 MEQ/L REPLACEMENT SOLN
Status: DC
  Filled 2023-01-27 (×3): qty 5000

## 2023-01-27 MED ORDER — DOCUSATE SODIUM 100 MG PO CAPS: 100.0000 mg | ORAL_CAPSULE | Freq: Two times a day (BID) | ORAL | Status: DC | PRN

## 2023-01-27 MED ORDER — ORAL CARE MOUTH RINSE: 15.0000 mL | OROMUCOSAL | Status: DC | PRN

## 2023-01-27 MED ORDER — APIXABAN 5 MG PO TABS: 5.0000 mg | ORAL_TABLET | Freq: Two times a day (BID) | ORAL | Status: DC

## 2023-01-27 MED ORDER — SODIUM ZIRCONIUM CYCLOSILICATE 10 G PO PACK
10.0000 g | PACK | Freq: Once | ORAL | Status: AC
  Administered 2023-01-27: 10 g via ORAL
  Filled 2023-01-27: qty 1

## 2023-01-27 MED ORDER — POLYETHYLENE GLYCOL 3350 17 G PO PACK: 17.0000 g | PACK | Freq: Every day | ORAL | Status: DC | PRN

## 2023-01-27 MED ORDER — VANCOMYCIN HCL 10 G IV SOLR
2500.0000 mg | Freq: Once | INTRAVENOUS | Status: AC
  Administered 2023-01-27: 2500 mg via INTRAVENOUS
  Filled 2023-01-27: qty 50

## 2023-01-27 MED ORDER — NOREPINEPHRINE 4 MG/250ML-% IV SOLN
2.0000 ug/min | INTRAVENOUS | Status: DC
  Administered 2023-01-27: 2 ug/min via INTRAVENOUS
  Filled 2023-01-27: qty 250

## 2023-01-27 MED ORDER — ALBUMIN HUMAN 25 % IV SOLN
12.5000 g | Freq: Once | INTRAVENOUS | Status: AC
  Administered 2023-01-27: 12.5 g via INTRAVENOUS
  Filled 2023-01-27: qty 50

## 2023-01-27 MED ORDER — SODIUM CHLORIDE 0.9 % IV SOLN
500.0000 [IU]/h | INTRAVENOUS | Status: DC
  Filled 2023-01-27: qty 2

## 2023-01-27 MED ORDER — ACETAMINOPHEN 325 MG PO TABS
650.0000 mg | ORAL_TABLET | ORAL | Status: DC | PRN
  Administered 2023-02-01 – 2023-02-02 (×2): 650 mg via ORAL
  Filled 2023-01-27 (×2): qty 2

## 2023-01-27 MED ORDER — BACITRACIN ZINC 500 UNIT/GM EX OINT: TOPICAL_OINTMENT | Freq: Every day | CUTANEOUS | Status: DC

## 2023-01-27 MED ORDER — SODIUM CHLORIDE 0.9 % IV SOLN
1.0000 g | Freq: Once | INTRAVENOUS | Status: AC
  Administered 2023-01-27: 1 g via INTRAVENOUS
  Filled 2023-01-27: qty 10

## 2023-01-27 MED ORDER — SODIUM CHLORIDE 0.9 % IV SOLN: 1.0000 g | INTRAVENOUS | Status: DC

## 2023-01-27 MED ORDER — VANCOMYCIN VARIABLE DOSE PER UNSTABLE RENAL FUNCTION (PHARMACIST DOSING): Status: DC

## 2023-01-27 MED ORDER — CHLORHEXIDINE GLUCONATE CLOTH 2 % EX PADS
6.0000 | MEDICATED_PAD | Freq: Every day | CUTANEOUS | Status: DC
  Administered 2023-01-27 – 2023-02-01 (×4): 6 via TOPICAL

## 2023-01-27 MED ORDER — SODIUM CHLORIDE 0.9 % IV SOLN
250.0000 mL | INTRAVENOUS | Status: DC
  Administered 2023-01-27 – 2023-01-30 (×3): 250 mL via INTRAVENOUS

## 2023-01-27 NOTE — Procedures (Signed)
I was present at the CRRT session, have reviewed the session and made  appropriate changes Vinson Moselle MD  [facility] 01/27/2023, 4:28 PM

## 2023-01-27 NOTE — Progress Notes (Signed)
Was called as pt was weaned off of pressors quickly-  labs done this evening reveal stable albeit not great lbut not dangerous numbers.  Iv team has not come to remove TPA from catheter.  Pt is comfortable.  For all of these reasons will put CRRT on hold for overnight and reassess in the AM for IHD vs CRRT   Cecille Aver

## 2023-01-27 NOTE — Progress Notes (Addendum)
Pharmacy Antibiotic Note  [name] is a 47 y.o. male admitted on 01/27/2023 with sepsis. Pt has a nonhealing wound on r foot. Pt was recently admitted on 6/29 and had a r toe amputation on 7/5. Discharged on vancomycin and flagyl with an intended 4 week course. On 7/24, HD catheter was not working and pt missed 2 HD sessions Pt is ESRD on HD (T/Th/Sat). WBC wnl, tmax 99.2. Pharmacy has been consulted for vancomycin and cefepime dosing.  Plan: Will dose vancomycin for renal replacement  Will order a vancomycin random tomorrow AM  Start cefepime 2 g IV every 12 hours  Monitor WBC, temperature, and cultures F/u LOT and renal replacement       Temp (24hrs), Avg:101 F (38.3 C), Min:99 F (37.2 C), Max:102.9 F (39.4 C)  Recent Labs  Lab 01/27/23 1114 01/27/23 1125  WBC 5.6  --   CREATININE 19.73*  --   LATICACIDVEN  --  2.2*    CrCl cannot be calculated (Unknown ideal weight.).    No Known Allergies  Antimicrobials this admission: 8/20 vancomycin >>  8/20 cefepime >>   Previously  Discharged on 7/25 on vancomycin 1250 mg every t/thur/sat with dialysis (intended for 22 days) and metronidazole 500 mg every 12 hours (intended for 24 days)  Dose adjustments this admission:   Microbiology results: 8/20 BCx:   7/5 R foot cx: MRSA  7/1 wound cx: MRSA   Thank you for allowing pharmacy to be a part of this patient's care.  Griffin Dakin 01/27/2023 2:46 PM

## 2023-01-27 NOTE — ED Notes (Signed)
ED TO INPATIENT HANDOFF REPORT  ED Nurse Name and Phone #:   S Name/Age/Gender Verdene Lennert Schrupp 47 y.o. male Room/Bed: TRAAC/TRAAC  Code Status   Code Status: Full Code  Home/SNF/Other Home Patient oriented to: self, place, time, and situation Is this baseline? Yes   Triage Complete: Triage complete  Chief Complaint Septic shock (HCC) [A41.9, R65.21]  Triage Note Pt arrives via [facility] from dialysis center. Pt was trying to have normal scheduled dialysis but L fem fistula was clogged. Pt vitals were obtained and called EMS. Pt met code sepsis criteria with EMS.   Pt took 1 g tylenol at 0745   Allergies No Known Allergies  Level of Care/Admitting Diagnosis ED Disposition     ED Disposition  Admit   Condition  --   Comment  Hospital Area: [facility] [[id number]]  Level of Care: ICU [6]  May admit patient to [facility] or [facility] if equivalent level of care is available:: Yes  Covid Evaluation: Asymptomatic - no recent exposure (last 10 days) testing not required  Diagnosis: Septic shock [facility]) [[id number]]  Admitting Physician: Lynnell Catalan [[id number]]  Attending Physician: Lynnell Catalan [phone]  Certification:: I certify this patient will need inpatient services for at least 2 midnights  Expected Medical Readiness: 01/30/2023          B Medical/Surgery History Past Medical History:  Diagnosis Date   Acute respiratory failure with hypoxia (HCC) 11/30/2018   Anemia    ESRD   ESRD on hemodialysis (HCC) 06/07/2011   TTS [facility]. Started HD in 2006, got transplant in 2014 lasted until Dec 2019 then went back on HD.  Has L thigh AVG as of Jun 2020.  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    History of blood transfusion    Hypertension    03/19/22- has not had high blood pressure in 5 years.   Morbid obesity (HCC)    Paroxysmal atrial fibrillation (HCC)    Pre-diabetes    no meds   Sepsis (HCC) 11/30/2018   Sleep apnea     lost weight, does not use CPAP   Past Surgical History:  Procedure Laterality Date   AMPUTATION Right 12/12/2022   Procedure: AMPUTATION RAY;  Surgeon: Louann Sjogren, DPM;  [facility];  Service: Orthopedics/Podiatry;  Laterality: Right;  surgical team to do local block   ARTERIOVENOUS GRAFT PLACEMENT  08/09/2010   Left Thigh Graft by Dr. Cari Caraway   AV FISTULA PLACEMENT Right 05/18/2018   Procedure: INSERTION OF ARTERIOVENOUS (AV) GORE-TEX GRAFT Left THIGH;  Surgeon: Maeola Harman, MD;  Location: [facility] OR;  Service: Vascular;  Laterality: Right;   AV FISTULA PLACEMENT Right 02/15/2021   Procedure: INSERTION OF ARTERIOVENOUS (AV) GORE-TEX LOOP GRAFT RIGHT THIGH;  Surgeon: Maeola Harman, MD;  Location: [facility] OR;  Service: Vascular;  Laterality: Right;   BIOPSY  05/20/2022   Procedure: BIOPSY;  Surgeon: Shellia Cleverly, DO;  [facility];  Service: Gastroenterology;;   CAPD INSERTION N/A 06/13/2022   Procedure: LAPAROSCOPIC INSERTION CONTINUOUS AMBULATORY PERITONEAL DIALYSIS  (CAPD) CATHETER;  Surgeon: Maeola Harman, MD;  Location: [facility] OR;  Service: Vascular;  Laterality: N/A;   CAPD REMOVAL  05/08/2011   Procedure: CONTINUOUS AMBULATORY PERITONEAL DIALYSIS  (CAPD) CATHETER REMOVAL;  Surgeon: Iona Coach, MD;  [facility];  Service: General;  Laterality: N/A;  Removal of CAPD catheter, Dr. requests to go after 100   CAPD REMOVAL N/A 08/08/2022  Procedure: REMOVAL CONTINUOUS AMBULATORY PERITONEAL DIALYSIS  (CAPD) CATHETER;  Surgeon: Maeola Harman, MD;  Location: [facility] OR;  Service: Vascular;  Laterality: N/A;   COLONOSCOPY WITH PROPOFOL N/A 05/20/2022   Procedure: COLONOSCOPY WITH PROPOFOL;  Surgeon: Shellia Cleverly, DO;  [facility];  Service: Gastroenterology;  Laterality: N/A;   I & D EXTREMITY Right 12/08/2022   Procedure: IRRIGATION AND DEBRIDEMENT RIGHT FOOT;  Surgeon: Louann Sjogren, DPM;  [facility];   Service: Orthopedics/Podiatry;  Laterality: Right;   INSERTION OF DIALYSIS CATHETER  10/05/2010   Right Femoral Cath insertion by Dr. Leonides Sake.  Pt ahas had several caths inserted.   INSERTION OF DIALYSIS CATHETER Right 02/15/2021   Procedure: Attempted INSERTION OF RIGHT and Left Internal Jugular DIALYSIS CATHETER, Insertion of Left Femoral Vein Dialysis Catheter;  Surgeon: Maeola Harman, MD;  Location: [facility] OR;  Service: Vascular;  Laterality: Right;   INSERTION OF DIALYSIS CATHETER Right 04/26/2021   Procedure: INSERTION OF TUNNELED 55cm PALIDROME PRECISION CHRONIC DIALYSIS CATHETER;  Surgeon: Leonie Douglas, MD;  [facility];  Service: Vascular;  Laterality: Right;   INSERTION OF DIALYSIS CATHETER Left 12/26/2021   Procedure: INSERTION OF TUNNELED  DIALYSIS CATHETER LEFT FEMORAL ARTERY;  Surgeon: Maeola Harman, MD;  Location: [facility] OR;  Service: Vascular;  Laterality: Left;   INSERTION OF DIALYSIS CATHETER Left 03/06/2022   Procedure: INSERTION OF DIALYSIS CATHETER;  Surgeon: Maeola Harman, MD;  Location: [facility] OR;  Service: Vascular;  Laterality: Left;   INSERTION OF DIALYSIS CATHETER Left 12/31/2022   Procedure: INSERTION OF DIALYSIS CATHETER LEFT GROIN;  Surgeon: Leonie Douglas, MD;  [facility];  Service: Vascular;  Laterality: Left;   IR FLUORO GUIDE CV LINE LEFT  04/23/2022   IR FLUORO GUIDE CV LINE RIGHT  05/11/2018   IR FLUORO GUIDE CV LINE RIGHT  07/03/2021   IR FLUORO GUIDE CV LINE RIGHT  07/16/2021   IR PTA ADDL CENTRAL DIALYSIS SEG THRU DIALY CIRCUIT RIGHT Right 07/03/2021   IR US GUIDE VASC ACCESS LEFT  04/23/2022   IR US GUIDE VASC ACCESS RIGHT  05/11/2018   IR VENO/EXT/UNI LEFT  04/23/2022   IR VENOCAVAGRAM IVC  07/16/2021   KIDNEY TRANSPLANT  2014   KNEE ARTHROSCOPY Left    LAPAROSCOPIC LYSIS OF ADHESIONS N/A 06/13/2022   Procedure: LAPAROSCOPIC LYSIS OF ADHESIONS;  Surgeon: Maeola Harman, MD;  Location: [facility] OR;  Service: Vascular;   Laterality: N/A;   POLYPECTOMY  05/20/2022   Procedure: POLYPECTOMY;  Surgeon: Shellia Cleverly, DO;  [facility];  Service: Gastroenterology;;   THROMBECTOMY W/ EMBOLECTOMY Right 04/26/2021   Procedure: REMOVAL OF RIGHT THIGH ARTERIOVENOUS GORE-TEX GRAFT AND REPAIR OF RIGHT COMMON FEMORAL ARTERY;  Surgeon: Leonie Douglas, MD;  [facility];  Service: Vascular;  Laterality: Right;   UPPER EXTREMITY ANGIOGRAPHY Bilateral 05/13/2018   Procedure: UPPER EXTREMITY ANGIOGRAPHY - bilarteral;  Surgeon: Cephus Shelling, MD;  [facility];  Service: Cardiovascular;  Laterality: Bilateral;     A IV Location/Drains/Wounds Patient Lines/Drains/Airways Status     Active Line/Drains/Airways     Name Placement date Placement time Site Days   Peripheral IV 01/27/23 20 G 1" Right Antecubital 01/27/23  1109  Antecubital  less than 1   Peripheral IV 01/27/23 20 G Left;Posterior Hand 01/27/23  1303  Hand  less than 1   Fistula / Graft Left Thigh Arteriovenous vein graft 05/18/18  1044  Thigh  1715  Fistula / Graft Right Femoral vein Arteriovenous vein graft 02/15/21  1000  Femoral vein  711   Hemodialysis Catheter Left Femoral vein Double lumen Permanent (Tunneled) 12/31/22  2155  Femoral vein  27   Wound / Incision (Open or Dehisced) 12/06/22 Non-pressure wound Toe (Comment  which one) Anterior;Left 12/06/22  1626  Toe (Comment  which one)  52   Wound / Incision (Open or Dehisced) 12/06/22 Foot Anterior;Right 12/06/22  1626  Foot  52            Intake/Output Last 24 hours No intake or output data in the 24 hours ending 01/27/23 1415  Labs/Imaging Results for orders placed or performed during the hospital encounter of 01/27/23 (from the past 48 hour(s))  Comprehensive metabolic panel     Status: Abnormal   Collection Time: 01/27/23 11:14 AM  Result Value Ref Range   Sodium 132 (L) 135 - 145 mmol/L   Potassium 5.6 (H) 3.5 - 5.1 mmol/L   Chloride 95 (L) 98 -  111 mmol/L   CO2 19 (L) 22 - 32 mmol/L   Glucose, Bld 95 70 - 99 mg/dL    Comment: Glucose reference range applies only to samples taken after fasting for at least 8 hours.   BUN 92 (H) 6 - 20 mg/dL   Creatinine, Ser 16.10 (H) 0.61 - 1.24 mg/dL   Calcium 8.4 (L) 8.9 - 10.3 mg/dL   Total Protein 7.5 6.5 - 8.1 g/dL   Albumin 2.9 (L) 3.5 - 5.0 g/dL   AST 50 (H) 15 - 41 U/L   ALT 53 (H) 0 - 44 U/L   Alkaline Phosphatase 149 (H) 38 - 126 U/L   Total Bilirubin 0.8 0.3 - 1.2 mg/dL   GFR, Estimated 3 (L) >60 mL/min    Comment: (NOTE) Calculated using the CKD-EPI Creatinine Equation (2021)    Anion gap 18 (H) 5 - 15    Comment: Performed at [facility], [address], [place], [place] [address]  CBC with Differential     Status: Abnormal   Collection Time: 01/27/23 11:14 AM  Result Value Ref Range   WBC 5.6 4.0 - 10.5 K/uL   RBC 3.09 (L) 4.22 - 5.81 MIL/uL   Hemoglobin 8.5 (L) 13.0 - 17.0 g/dL   HCT 40.9 (L) 81.1 - 91.4 %   MCV 86.7 80.0 - 100.0 fL   MCH 27.5 26.0 - 34.0 pg   MCHC 31.7 30.0 - 36.0 g/dL   RDW 78.2 (H) 95.6 - 21.3 %   Platelets 214 150 - 400 K/uL   nRBC 0.0 0.0 - 0.2 %   Neutrophils Relative % 78 %   Neutro Abs 4.4 1.7 - 7.7 K/uL   Lymphocytes Relative 9 %   Lymphs Abs 0.5 (L) 0.7 - 4.0 K/uL   Monocytes Relative 9 %   Monocytes Absolute 0.5 0.1 - 1.0 K/uL   Eosinophils Relative 3 %   Eosinophils Absolute 0.2 0.0 - 0.5 K/uL   Basophils Relative 1 %   Basophils Absolute 0.1 0.0 - 0.1 K/uL   nRBC 0 0 /100 WBC   Abs Immature Granulocytes 0.00 0.00 - 0.07 K/uL   Polychromasia PRESENT     Comment: Performed at [facility], [address], [place], [place] [address]  I-Stat Lactic Acid, ED     Status: Abnormal   Collection Time: 01/27/23 11:25 AM  Result Value Ref Range   Lactic Acid, Venous 2.2 (HH) 0.5 - 1.9  mmol/L   Comment NOTIFIED PHYSICIAN   SARS Coronavirus 2 by RT PCR (hospital order, performed in [facility] hospital lab) *cepheid single  result test* Anterior Nasal Swab     Status: None   Collection Time: 01/27/23 11:42 AM   Specimen: Anterior Nasal Swab  Result Value Ref Range   SARS Coronavirus 2 by RT PCR NEGATIVE NEGATIVE    Comment: Performed at [facility], [address] [place], [place] [address]   No results found.  Pending Labs Unresulted Labs (From admission, onward)     Start     Ordered   01/28/23 0500  CBC  Tomorrow morning,   R        01/27/23 1412   01/28/23 0500  Basic metabolic panel  Tomorrow morning,   R        01/27/23 1412   01/27/23 1413  Procalcitonin  Once,   R       References:    Procalcitonin Lower Respiratory Tract Infection AND Sepsis Procalcitonin Algorithm   01/27/23 1412   01/27/23 1413  Lactic acid, plasma  (Lactic Acid)  STAT Now then every 3 hours,   R (with STAT occurrences)      01/27/23 1412   01/27/23 1413  Cortisol  ONCE - URGENT,   URGENT        01/27/23 1412   01/27/23 1220  Protime-INR  Once,   STAT        01/27/23 1220   01/27/23 1033  Culture, blood (Routine x 2)  BLOOD CULTURE X 2,   R (with STAT occurrences)      01/27/23 1032            Vitals/Pain Today's Vitals   01/27/23 1342 01/27/23 1345 01/27/23 1350 01/27/23 1351  BP:  (!) 91/50 (!) 93/59   Pulse:  91 93 94  Resp:  15  16  Temp: 99 F (37.2 C)     SpO2:  98% 97% 94%  PainSc:        Isolation Precautions Airborne and Contact precautions  Medications Medications  vancomycin (VANCOCIN) 2,500 mg in sodium chloride 0.9 % 500 mL IVPB (2,500 mg Intravenous New Bag/Given 01/27/23 1307)  0.9 %  sodium chloride infusion (has no administration in time range)  norepinephrine (LEVOPHED) 4mg  in (0.016 mg/mL) premix infusion (7 mcg/min Intravenous Rate/Dose Change 01/27/23 1335)  docusate sodium (COLACE) capsule 100 mg (has no administration in time range)  polyethylene glycol (MIRALAX / GLYCOLAX) packet 17 g (has no administration in time range)  midodrine (PROAMATINE) tablet 15 mg (has no  administration in time range)  sodium chloride 0.9 % bolus 1,000 mL (0 mLs Intravenous Stopped 01/27/23 1254)  cefTRIAXone (ROCEPHIN) 2 g in sodium chloride 0.9 % 100 mL IVPB (0 g Intravenous Stopped 01/27/23 1212)    Mobility non-ambulatory     Focused Assessments Cardiac Assessment Handoff:    No results found for: "CKTOTAL", "CKMB", "CKMBINDEX", "TROPONINI" No results found for: "DDIMER" Does the Patient currently have chest pain? No    R Recommendations: See Admitting Provider Note  Report given to:   Additional Notes:

## 2023-01-27 NOTE — ED Notes (Signed)
Patient states he does not make urine anymore.

## 2023-01-27 NOTE — ED Triage Notes (Signed)
Pt arrives via [facility] from dialysis center. Pt was trying to have normal scheduled dialysis but L fem fistula was clogged. Pt vitals were obtained and called EMS. Pt met code sepsis criteria with EMS.   Pt took 1 g tylenol at 0745

## 2023-01-27 NOTE — Progress Notes (Signed)
   01/27/23 2112  BiPAP/CPAP/SIPAP  Reason BIPAP/CPAP not in use Other(comment) (removed & refused)

## 2023-01-27 NOTE — Consult Note (Addendum)
WOC Nurse Consult Note: Reason for Consult: Consult requested for right foot.  Performed remotely after review of progress notes and photos in the EMR. Wound type: Right anterior foot with surgical great toe amputation site; this was performed on 7/5 by Dr Ralene Cork of the podiatry team.  Wound is dark red-purple with old dried blood. Pt was seen as an outpatient in their office yesterday for wound assessment.  Their team has ordered Betadine dressings.  Dressing procedure/placement/frequency: Continue present plan of care as previously ordered.  Topical treatment orders provided for bedside nurses to perform as follows: Apply Betadine, then non-adherent dressing, then kerlex to foot Q day. Pt should follow-up with the podiatry team after discharge.  Please re-consult if further assistance is needed.  Thank-you,  Cammie Mcgee MSN, RN, CWOCN, [place], CNS [phone]

## 2023-01-27 NOTE — Consult Note (Signed)
Renal Service Consult Note [facility]  [facility] 01/27/2023 Maree Krabbe, MD Requesting Physician: Dr. Denese Killings  Reason for Consult: ESRD pt w/ shock, hypotension, fevers HPI: The patient is a 47 y.o. year-old w/ PMH as below who presented to ED sent from HD unit for hypotension and chills. Pt went to HD today and had 2.5 hrs of dialysis but due to acute/ chronic hypotension and chills he was taken off and sent to ED where BP's were low.  Pressors were started and pt admitted to ICU, blood cx's sent and IV abx were started. We are asked to see for RRT.    Pt seen in room. Was doing well til this am starting having chills. No cough or abd pain. Did have toe amputation in early July while here for R foot osteo/ abscess.  Completed course of abx.   ROS - denies CP, no joint pain, no HA, no blurry vision, no rash, no diarrhea, no nausea/ vomiting, no dysuria, no difficulty voiding   Past Medical History  Past Medical History:  Diagnosis Date   Acute respiratory failure with hypoxia (HCC) 11/30/2018   Anemia    ESRD   ESRD on hemodialysis (HCC) 06/07/2011   TTS [facility]. Started HD in 2006, got transplant in 2014 lasted until Dec 2019 then went back on HD.  Has L thigh AVG as of Jun 2020.  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    History of blood transfusion    Hypertension    03/19/22- has not had high blood pressure in 5 years.   Morbid obesity (HCC)    Paroxysmal atrial fibrillation (HCC)    Pre-diabetes    no meds   Sepsis (HCC) 11/30/2018   Sleep apnea    lost weight, does not use CPAP   Past Surgical History  Past Surgical History:  Procedure Laterality Date   AMPUTATION Right 12/12/2022   Procedure: AMPUTATION RAY;  Surgeon: Louann Sjogren, DPM;  [facility] OR;  Service: Orthopedics/Podiatry;  Laterality: Right;  surgical team to do local block   ARTERIOVENOUS GRAFT PLACEMENT  08/09/2010   Left Thigh Graft by Dr. Cari Caraway    AV FISTULA PLACEMENT Right 05/18/2018   Procedure: INSERTION OF ARTERIOVENOUS (AV) GORE-TEX GRAFT Left THIGH;  Surgeon: Maeola Harman, MD;  Location: [facility] OR;  Service: Vascular;  Laterality: Right;   AV FISTULA PLACEMENT Right 02/15/2021   Procedure: INSERTION OF ARTERIOVENOUS (AV) GORE-TEX LOOP GRAFT RIGHT THIGH;  Surgeon: Maeola Harman, MD;  Location: [facility] OR;  Service: Vascular;  Laterality: Right;   BIOPSY  05/20/2022   Procedure: BIOPSY;  Surgeon: Shellia Cleverly, DO;  [facility];  Service: Gastroenterology;;   CAPD INSERTION N/A 06/13/2022   Procedure: LAPAROSCOPIC INSERTION CONTINUOUS AMBULATORY PERITONEAL DIALYSIS  (CAPD) CATHETER;  Surgeon: Maeola Harman, MD;  Location: [facility] OR;  Service: Vascular;  Laterality: N/A;   CAPD REMOVAL  05/08/2011   Procedure: CONTINUOUS AMBULATORY PERITONEAL DIALYSIS  (CAPD) CATHETER REMOVAL;  Surgeon: Iona Coach, MD;  [facility] OR;  Service: General;  Laterality: N/A;  Removal of CAPD catheter, Dr. requests to go after 100   CAPD REMOVAL N/A 08/08/2022   Procedure: REMOVAL CONTINUOUS AMBULATORY PERITONEAL DIALYSIS  (CAPD) CATHETER;  Surgeon: Maeola Harman, MD;  Location: [facility] OR;  Service: Vascular;  Laterality: N/A;   COLONOSCOPY WITH PROPOFOL N/A 05/20/2022   Procedure: COLONOSCOPY WITH PROPOFOL;  Surgeon: Shellia Cleverly, DO;  [facility];  Service: Gastroenterology;  Laterality: N/A;   I & D EXTREMITY Right 12/08/2022   Procedure: IRRIGATION AND DEBRIDEMENT RIGHT FOOT;  Surgeon: Louann Sjogren, DPM;  [facility] OR;  Service: Orthopedics/Podiatry;  Laterality: Right;   INSERTION OF DIALYSIS CATHETER  10/05/2010   Right Femoral Cath insertion by Dr. Leonides Sake.  Pt ahas had several caths inserted.   INSERTION OF DIALYSIS CATHETER Right 02/15/2021   Procedure: Attempted INSERTION OF RIGHT and Left Internal Jugular DIALYSIS CATHETER, Insertion of Left Femoral Vein Dialysis Catheter;   Surgeon: Maeola Harman, MD;  Location: [facility] OR;  Service: Vascular;  Laterality: Right;   INSERTION OF DIALYSIS CATHETER Right 04/26/2021   Procedure: INSERTION OF TUNNELED 55cm PALIDROME PRECISION CHRONIC DIALYSIS CATHETER;  Surgeon: Leonie Douglas, MD;  [facility];  Service: Vascular;  Laterality: Right;   INSERTION OF DIALYSIS CATHETER Left 12/26/2021   Procedure: INSERTION OF TUNNELED  DIALYSIS CATHETER LEFT FEMORAL ARTERY;  Surgeon: Maeola Harman, MD;  Location: [facility] OR;  Service: Vascular;  Laterality: Left;   INSERTION OF DIALYSIS CATHETER Left 03/06/2022   Procedure: INSERTION OF DIALYSIS CATHETER;  Surgeon: Maeola Harman, MD;  Location: [facility] OR;  Service: Vascular;  Laterality: Left;   INSERTION OF DIALYSIS CATHETER Left 12/31/2022   Procedure: INSERTION OF DIALYSIS CATHETER LEFT GROIN;  Surgeon: Leonie Douglas, MD;  [facility];  Service: Vascular;  Laterality: Left;   IR FLUORO GUIDE CV LINE LEFT  04/23/2022   IR FLUORO GUIDE CV LINE RIGHT  05/11/2018   IR FLUORO GUIDE CV LINE RIGHT  07/03/2021   IR FLUORO GUIDE CV LINE RIGHT  07/16/2021   IR PTA ADDL CENTRAL DIALYSIS SEG THRU DIALY CIRCUIT RIGHT Right 07/03/2021   IR US GUIDE VASC ACCESS LEFT  04/23/2022   IR US GUIDE VASC ACCESS RIGHT  05/11/2018   IR VENO/EXT/UNI LEFT  04/23/2022   IR VENOCAVAGRAM IVC  07/16/2021   KIDNEY TRANSPLANT  2014   KNEE ARTHROSCOPY Left    LAPAROSCOPIC LYSIS OF ADHESIONS N/A 06/13/2022   Procedure: LAPAROSCOPIC LYSIS OF ADHESIONS;  Surgeon: Maeola Harman, MD;  Location: [facility] OR;  Service: Vascular;  Laterality: N/A;   POLYPECTOMY  05/20/2022   Procedure: POLYPECTOMY;  Surgeon: Shellia Cleverly, DO;  [facility];  Service: Gastroenterology;;   THROMBECTOMY W/ EMBOLECTOMY Right 04/26/2021   Procedure: REMOVAL OF RIGHT THIGH ARTERIOVENOUS GORE-TEX GRAFT AND REPAIR OF RIGHT COMMON FEMORAL ARTERY;  Surgeon: Leonie Douglas, MD;  [facility];   Service: Vascular;  Laterality: Right;   UPPER EXTREMITY ANGIOGRAPHY Bilateral 05/13/2018   Procedure: UPPER EXTREMITY ANGIOGRAPHY - bilarteral;  Surgeon: Cephus Shelling, MD;  [facility];  Service: Cardiovascular;  Laterality: Bilateral;   Family History  Family History  Problem Relation Age of Onset   Diabetes Father    Stroke Father    Stroke Maternal Grandmother    Anesthesia problems Neg Hx    Social History  reports that he quit smoking about 7 years ago. His smoking use included cigarettes. He has never used smokeless tobacco. He reports that he does not currently use alcohol. He reports that he does not currently use drugs after having used the following drugs: Marijuana. Allergies No Known Allergies Home medications Prior to Admission medications   Medication Sig Start Date End Date Taking? Authorizing Provider  midodrine (PROAMATINE) 5 MG tablet Take 15 mg by mouth 3 (three) times daily. 01/20/23  Yes [provider]  apixaban (ELIQUIS) 5 MG TABS tablet Take 5 mg by mouth 2 (two) times daily. 10/10/21   [provider]  atorvastatin (LIPITOR) 40 MG tablet Take 1 tablet (40 mg total) by mouth daily. 12/20/21   [name], MD  B Complex-C-Zn-Folic Acid (DIALYVITE/ZINC) TABS Take 1 tablet by mouth daily. 01/16/22   [provider]  Calcium Carb-Cholecalciferol (CALCIUM 600 + D PO) Take 2 tablets by mouth daily.    [provider]  ferric citrate (AURYXIA) 1 GM 210 MG(Fe) tablet Take 210 mg by mouth See admin instructions. Take 3 tablets 3 times daily and 1 tablet twice daily with meals 09/06/19   [provider]     Vitals:   01/27/23 1400 01/27/23 1420 01/27/23 1440 01/27/23 1507  BP: (!) 91/56 (!) 94/53 (!) 97/53   Pulse: 96 93 87   Resp:  19    Temp:    99.2 F (37.3 C)  SpO2: 100% 99% 100%   Weight:    (!) 141.1 kg  Height:    6\' 2"  (1.88 m)   Exam Gen alert, no distress No rash, cyanosis or  gangrene Sclera anicteric, throat clear  No jvd or bruits Chest clear bilat to bases, no rales/ wheezing RRR no RG Abd soft obese ntnd no mass or ascites +bs GU normal male MS no joint effusions or deformity Ext no LE or UE edema, no wounds or ulcers Neuro is alert, Ox 3 , nf    L groin TDC w/o exudate or tenderness      Home meds include - lipitor, auryxia 3 ac, midodrine 15 tid, eliquis    CXR 8/20 - no active disease   OP HD: SW TTS  4.5h  500/500  137kg   2/2 bath  TDC L groin  Heparin 5000+ 2500 midrun - last OP HD was this am, got 2 1/4 hrs of dialysis, post wt 140.5kg   - parsabiv 7.5mg  IV  - mircera 100 mcg IV q 2 wks, last 8/06, due 8/20 (today)    Na 132  K 5.6  CO2 19  BUN 92   creat 19   alb 2.9     Hb 8.5      Assessment/ Plan: Septic shock - possibly due to foot infection, vs HD cath-related bacteremia. Getting IV abx per CCM. F/u blood cx's.  Chronic osteo R foot - sp R toe amp 7/5, completed 4 wks IV abx ESRD - on HD TTS. Has 2.5 hrs HD this am. K+, creat are up - plan will be CRRT for now.  Volume - up 2-3kg post HD today. No resp distress, CXR clear.  Anemia esrd - Hb 8s, follow, tx prn.  MBD ckd - CCa in range, add on phos. Follow.  PAF OSA      Vinson Moselle  MD [facility] 01/27/2023, 4:12 PM  Recent Labs  Lab 01/27/23 1114  HGB 8.5*  ALBUMIN 2.9*  CALCIUM 8.4*  CREATININE 19.73*  K 5.6*   Inpatient medications:  bacitracin   Topical Daily   Chlorhexidine Gluconate Cloth  6 each Topical Daily   midodrine  15 mg Oral TID WC    sodium chloride 250 mL (01/27/23 1547)   albumin human     calcium gluconate 2,000 mg (01/27/23 1557)   ceFEPime (MAXIPIME) IV 1 g (01/27/23 1548)   metronidazole     norepinephrine (LEVOPHED) Adult infusion 7 mcg/min (01/27/23 1335)   acetaminophen, docusate sodium, mouth rinse, polyethylene glycol

## 2023-01-27 NOTE — Progress Notes (Signed)
Sleep study noted 07/03/11 Pt states does not and has not worn CPAP @ home. Pt set up BiPAP auto per protocol w/nasal mask. Pt seems to be tol well despite seemingly not interested in wearing.   01/27/23 2052  BiPAP/CPAP/SIPAP  $ Non-Invasive Home Ventilator  Initial  $ Face Mask Large  Yes  $ Face Mask Medium Yes  BiPAP/CPAP/SIPAP Pt Type Adult  BiPAP/CPAP/SIPAP Resmed  Mask Type Nasal mask  Mask Size Medium  IPAP  (24 mas)  EPAP  (19 min)  Pressure Support 2 cmH20  Flow Rate 2 lpm

## 2023-01-27 NOTE — ED Provider Notes (Signed)
[place] EMERGENCY DEPARTMENT AT [facility] Provider Note   CSN: [id number] Arrival date & time: 01/27/23  1017     History  Chief Complaint  Patient presents with   Code Sepsis    [name] is a 47 y.o. male.  Pt is a 47 yo male with pmhx significant for ESRD on HD (Tu, Th, and Sat), morbid obesity, afib (on eliquis), hld, and sleep apnea.  Pt went to dialysis today and his left femoral fistula was clogged.  They also noticed that he was hypotensive and had a fever.  EMS was called and brought here.  Pt said he feels bad, but denies any pain.  He has a nonhealing wound to his right foot.  He did see his podiatrist (Dr. Ralene Cork) yesterday.  She removed non-viable soft tissue and debrided wound.  Pt said it's bled and drained since then.  Pt did receive 1 g tylenol at 0745.  Pt does not urinate.        Home Medications Prior to Admission medications   Medication Sig Start Date End Date Taking? Authorizing Provider  midodrine (PROAMATINE) 5 MG tablet Take 15 mg by mouth 3 (three) times daily. 01/20/23  Yes [provider]  apixaban (ELIQUIS) 5 MG TABS tablet Take 5 mg by mouth 2 (two) times daily. 10/10/21   [provider]  atorvastatin (LIPITOR) 40 MG tablet Take 1 tablet (40 mg total) by mouth daily. 12/20/21   [name], MD  B Complex-C-Zn-Folic Acid (DIALYVITE/ZINC) TABS Take 1 tablet by mouth daily. 01/16/22   [provider]  Calcium Carb-Cholecalciferol (CALCIUM 600 + D PO) Take 2 tablets by mouth daily.    [provider]  ferric citrate (AURYXIA) 1 GM 210 MG(Fe) tablet Take 210 mg by mouth See admin instructions. Take 3 tablets 3 times daily and 1 tablet twice daily with meals 09/06/19   [provider]      Allergies    Patient has no known allergies.    Review of Systems   Review of Systems  Constitutional:  Positive for fever.  Skin:  Positive for wound.  Neurological:  Positive for weakness.  All  other systems reviewed and are negative.   Physical Exam Updated Vital Signs BP (!) 97/53   Pulse 87   Temp 99.2 F (37.3 C)   Resp 19   Ht 6\' 2"  (1.88 m)   Wt (!) 141.1 kg   SpO2 100%   BMI 39.94 kg/m  Physical Exam Vitals and nursing note reviewed.  Constitutional:      Appearance: He is obese. He is ill-appearing.  HENT:     Head: Normocephalic and atraumatic.     Right Ear: External ear normal.     Left Ear: External ear normal.     Nose: Nose normal.     Mouth/Throat:     Mouth: Mucous membranes are moist.     Pharynx: Oropharynx is clear.  Eyes:     Extraocular Movements: Extraocular movements intact.     Conjunctiva/sclera: Conjunctivae normal.     Pupils: Pupils are equal, round, and reactive to light.  Cardiovascular:     Rate and Rhythm: Regular rhythm. Tachycardia present.     Pulses: Normal pulses.     Heart sounds: Normal heart sounds.  Pulmonary:     Effort: Pulmonary effort is normal.     Breath sounds: Normal breath sounds.  Abdominal:     General: Abdomen is flat. Bowel  sounds are normal.     Palpations: Abdomen is soft.  Musculoskeletal:     Cervical back: Normal range of motion and neck supple.     Comments: See pictures  Skin:    General: Skin is warm.     Capillary Refill: Capillary refill takes less than 2 seconds.     Comments: Right foot does feel warm  Neurological:     General: No focal deficit present.     Mental Status: He is alert and oriented to person, place, and time.  Psychiatric:        Mood and Affect: Mood normal.        Behavior: Behavior normal.        ED Results / Procedures / Treatments   Labs (all labs ordered are listed, but only abnormal results are displayed) Labs Reviewed  COMPREHENSIVE METABOLIC PANEL - Abnormal; Notable for the following components:      Result Value   Sodium 132 (*)    Potassium 5.6 (*)    Chloride 95 (*)    CO2 19 (*)    BUN 92 (*)    Creatinine, Ser 19.73 (*)    Calcium 8.4 (*)     Albumin 2.9 (*)    AST 50 (*)    ALT 53 (*)    Alkaline Phosphatase 149 (*)    GFR, Estimated 3 (*)    Anion gap 18 (*)    All other components within normal limits  CBC WITH DIFFERENTIAL/PLATELET - Abnormal; Notable for the following components:   RBC 3.09 (*)    Hemoglobin 8.5 (*)    HCT 26.8 (*)    RDW 18.6 (*)    Lymphs Abs 0.5 (*)    All other components within normal limits  PROTIME-INR - Abnormal; Notable for the following components:   Prothrombin Time 19.2 (*)    INR 1.6 (*)    All other components within normal limits  I-STAT CG4 LACTIC ACID, ED - Abnormal; Notable for the following components:   Lactic Acid, Venous 2.2 (*)    All other components within normal limits  SARS CORONAVIRUS 2 BY RT PCR  CULTURE, BLOOD (ROUTINE X 2)  CULTURE, BLOOD (ROUTINE X 2)  MRSA NEXT GEN BY PCR, NASAL  LACTIC ACID, PLASMA  GLUCOSE, CAPILLARY  PROCALCITONIN  LACTIC ACID, PLASMA  CORTISOL  I-STAT CG4 LACTIC ACID, ED    EKG EKG Interpretation Date/Time:  Tuesday January 27 2023 11:08:25 EDT Ventricular Rate:  116 PR Interval:  161 QRS Duration:  91 QT Interval:  316 QTC Calculation: 439 R Axis:   -49  Text Interpretation: Sinus tachycardia Left anterior fascicular block Low voltage, precordial leads Abnormal R-wave progression, late transition Since last tracing rate faster Confirmed by Jacalyn Lefevre [id number]) on 01/27/2023 1:14:23 PM  Radiology DG Foot Complete Right  Result Date: 01/27/2023 CLINICAL DATA:  Osteomyelitis. Status post amputation. Possible sepsis. EXAM: RIGHT FOOT COMPLETE - 3+ VIEW COMPARISON:  Right foot radiographs 12/12/2022 and 12/08/2022 FINDINGS: Redemonstration of amputation of the great toe to the mid metatarsal shaft. Within the limitations of recent surgery, no definite new cortical erosion is seen at the distal amputation site. There is soft tissue swelling at the amputation site. Interval decrease in air at the distal amputation site compared to  immediate postoperative radiographs 12/12/2022. Moderate plantar calcaneal heel spur. No acute fracture or dislocation. IMPRESSION: Redemonstration of amputation of the great toe to the mid metatarsal shaft. Within the limitations of recent surgery, no  definite new cortical erosion is seen at the distal amputation site. Electronically Signed   By: Neita Garnet M.D.   On: 01/27/2023 14:51   DG Chest Portable 1 View  Result Date: 01/27/2023 CLINICAL DATA:  Sepsis.  Toe amputation.  Possible sepsis. EXAM: PORTABLE CHEST 1 VIEW COMPARISON:  Chest radiographs 02/15/2021, 12/06/2022, 12/07/2022 FINDINGS: Moderately decreased lung volumes. Cardiac silhouette and mediastinal contours are within normal limits for technique and low lung volumes. No focal airspace opacity is seen. No pulmonary edema, pleural effusion, or pneumothorax. No acute skeletal abnormality. IMPRESSION: Low lung volumes. No pneumonia is identified. Electronically Signed   By: Neita Garnet M.D.   On: 01/27/2023 14:46    Procedures Procedures    Medications Ordered in ED Medications  0.9 %  sodium chloride infusion (250 mLs Intravenous New Bag/Given 01/27/23 1547)  norepinephrine (LEVOPHED) 4mg  in (0.016 mg/mL) premix infusion (7 mcg/min Intravenous Rate/Dose Change 01/27/23 1335)  docusate sodium (COLACE) capsule 100 mg (has no administration in time range)  polyethylene glycol (MIRALAX / GLYCOLAX) packet 17 g (has no administration in time range)  midodrine (PROAMATINE) tablet 15 mg (has no administration in time range)  bacitracin ointment (has no administration in time range)  metroNIDAZOLE (FLAGYL) IVPB 500 mg (has no administration in time range)  calcium gluconate 2 g/ 100 mL sodium chloride IVPB (2,000 mg Intravenous New Bag/Given 01/27/23 1557)  albumin human 25 % solution 12.5 g (has no administration in time range)  acetaminophen (TYLENOL) tablet 650 mg (has no administration in time range)  Chlorhexidine Gluconate  Cloth 2 % PADS 6 each (6 each Topical Given 01/27/23 1520)  Oral care mouth rinse (has no administration in time range)   prismasol BGK 4/2.5 infusion (has no administration in time range)   prismasol BGK 4/2.5 infusion (has no administration in time range)  prismasol BGK 4/2.5 infusion (has no administration in time range)  heparin injection 1,000-6,000 Units (has no administration in time range)  heparin 10,000 units/ 20 mL infusion syringe (has no administration in time range)  sodium chloride 0.9 % bolus 1,000 mL (0 mLs Intravenous Stopped 01/27/23 1254)  cefTRIAXone (ROCEPHIN) 2 g in sodium chloride 0.9 % 100 mL IVPB (0 g Intravenous Stopped 01/27/23 1212)  vancomycin (VANCOCIN) 2,500 mg in sodium chloride 0.9 % 500 mL IVPB (2,500 mg Intravenous New Bag/Given 01/27/23 1307)  sodium zirconium cyclosilicate (LOKELMA) packet 10 g (10 g Oral Given 01/27/23 1549)  ceFEPIme (MAXIPIME) 1 g in sodium chloride 0.9 % 100 mL IVPB (1 g Intravenous New Bag/Given 01/27/23 1548)    ED Course/ Medical Decision Making/ A&P                                 Medical Decision Making Amount and/or Complexity of Data Reviewed Labs: ordered. Radiology: ordered. ECG/medicine tests: ordered.  Risk Prescription drug management. Decision regarding hospitalization.   This patient presents to the ED for concern of sepsis, this involves an extensive number of treatment options, and is a complaint that carries with it a high risk of complications and morbidity.  The differential diagnosis includes osteomyelitis, cellulitis, covid, pna   Co morbidities that complicate the patient evaluation  ESRD on HD (Tu, Th, and Sat), morbid obesity, afib (on eliquis), hld, and sleep apnea.   Additional history obtained:  Additional history obtained from epic chart review External records from outside source obtained and reviewed including EMS report  Lab Tests:  I Ordered, and personally interpreted labs.  The  pertinent results include:  covid neg, cbc with wbc nl, hgb low at 8.5 (stable), cmp with k elevated at 5.6, CO low at 19, bun 92 and cr 19.73, LFTs slightly elevated with ast 50 and alt 53   Imaging Studies ordered:  I ordered imaging studies including cxr, right foot  I independently visualized and interpreted imaging which showed  CXR: Low lung volumes. No pneumonia is identified.  R foot: Redemonstration of amputation of the great toe to the mid metatarsal  shaft. Within the limitations of recent surgery, no definite new  cortical erosion is seen at the distal amputation site.   I agree with the radiologist interpretation   Cardiac Monitoring:  The patient was maintained on a cardiac monitor.  I personally viewed and interpreted the cardiac monitored which showed an underlying rhythm of: st   Medicines ordered and prescription drug management:  I ordered medication including rocephin/vancomycin  for sepsis  Reevaluation of the patient after these medicines showed that the patient improved I have reviewed the patients home medicines and have made adjustments as needed   Test Considered:  ct   Critical Interventions:  Abx, fluids   Consultations Obtained:  I requested consultation with the ccm,  and discussed lab and imaging findings as well as pertinent plan - they recommend:  Pt d/w Dr. Arlean Hopping (nephrology).  He will see pt in consult Pt d/w Dr. Edilia Bo (vascular) who recommends IV team dwell tpa if they are unable to get the line to work.  If this does not work, he can see pt.      Problem List / ED Course:  Septic shock:  Pt significantly hypotensive.  Pt given 1L NS.  He was not given more as he is a dialysis patient and has not had dialysis since Sat.  No improvement in bp.  Pt started on levophed and bp is finally increasing.  Source likely from right foot.  Cxr clear and he's not hypoxic.  He does not urinate.  Central line catheter site looks good.   Cultures obtained. ESRD on HD: nephrology consulted.  IV team consulted for dialysis catheter blockage.   Reevaluation:  After the interventions noted above, I reevaluated the patient and found that they have :improved   Social Determinants of Health:  Lives at home   Dispostion:  After consideration of the diagnostic results and the patients response to treatment, I feel that the patent would benefit from admission.  CRITICAL CARE Performed by: Jacalyn Lefevre   Total critical care time: 30 minutes  Critical care time was exclusive of separately billable procedures and treating other patients.  Critical care was necessary to treat or prevent imminent or life-threatening deterioration.  Critical care was time spent personally by me on the following activities: development of treatment plan with patient and/or surrogate as well as nursing, discussions with consultants, evaluation of patient's response to treatment, examination of patient, obtaining history from patient or surrogate, ordering and performing treatments and interventions, ordering and review of laboratory studies, ordering and review of radiographic studies, pulse oximetry and re-evaluation of patient's condition.           Final Clinical Impression(s) / ED Diagnoses Final diagnoses:  Sepsis, due to unspecified organism, unspecified whether acute organ dysfunction present (HCC)  ESRD on hemodialysis (HCC)  Non-healing wound of lower extremity, right, initial encounter  Septic shock (HCC)    Rx / DC Orders ED  Discharge Orders     None         Jacalyn Lefevre, MD 01/27/23 1630

## 2023-01-27 NOTE — H&P (Addendum)
NAME:  [name], MRN:  [id number], DOB:  1975-07-13, LOS: 0 ADMISSION DATE:  01/27/2023, CONSULTATION DATE:  8/20 REFERRING MD:  Dr. Particia Nearing, CHIEF COMPLAINT:  septic shock   History of Present Illness:  Patient is a 47 yo M w/ pertinent PMH ESRD on HD TTS, chronic right MRSA osteomyelitis, PAF on eliquis, chronic hypotension presents to [facility] ED on 8/20 w/ septic shock.  Patient recently admitted 6/29 w/ septic shock from right foot ulcer/abscess and osteomyelitis. Started on broad spectrum abx. Surgical cultures w/ MRSA. Required right toe amputation 7/5. Discharged on vanc and flagyl x 4 weeks.  7/24 w/ HD catheter not working well and missed 2 HD sessions. Vascular surgery exchanged catheter and patient received HD and was discharged next day. Patient states he has noticed minimal drainage around left femoral catheter soaking up gauze dressing the past few days. Denies any fever outpt. Completed course of antibiotics. Followed by podiatry outpt and wound dehiscence noticed requiring debridement and some bleeding coming for right toe amputation.  Patient went to dialysis today on 8/20 and was noted be febrile and hypotensive. Sent to [facility] ED. BP 70/48 and febrile 102.9. Sepsis protocol initiated. Cultures obtained and started on abx. Given iv fluids. Despite fluids remained hypotensive and started on levo. CXR no significant findings. RLE x-ray pening. Patient mentation well. PCCM consulted for icu admission.  Pertinent ED Labs: na 132, k 5.6, ast 50, alt 53, alk phosph 149, AG 18, la 2.2, covid/flu negative  Pertinent  Medical History   Past Medical History:  Diagnosis Date   Acute respiratory failure with hypoxia (HCC) 11/30/2018   Anemia    ESRD   ESRD on hemodialysis (HCC) 06/07/2011   TTS [facility]. Started HD in 2006, got transplant in 2014 lasted until Dec 2019 then went back on HD.  Has L thigh AVG as of Jun 2020.  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    History  of blood transfusion    Hypertension    03/19/22- has not had high blood pressure in 5 years.   Morbid obesity (HCC)    Paroxysmal atrial fibrillation (HCC)    Pre-diabetes    no meds   Sepsis (HCC) 11/30/2018   Sleep apnea    lost weight, does not use CPAP     Significant Hospital Events: Including procedures, antibiotic start and stop dates in addition to other pertinent events   8/20 admitted shock on levo  Interim History / Subjective:  See above   Objective   Blood pressure 90/71, pulse 95, temperature (!) 102.9 F (39.4 C), resp. rate 16, SpO2 99%.       No intake or output data in the 24 hours ending 01/27/23 1332 There were no vitals filed for this visit.  Examination: General:  ill appearing male in NAD HEENT: MM pink/moist Neuro: Aox3; MAE CV: s1s2, RRR, no m/r/g PULM:  dim clear BS bilaterally GI: soft, bsx4 active  Extremities: warm/dry, no edema; right toe amputation w/ some bleeding from site; left femoral hd cath with dressing soaked w/ some bloody drainage Skin: no rashes or lesions    Resolved Hospital Problem list     Assessment & Plan:   Septic shock: likely related to osteomyelitis of right foot. Given some discharge coming from fem hd cath could be line related sepsis? Chronic hypotension Chronic right foot osteomyelitis s/p right toe amputation 7/5: recently completed 4 week antibiotic course of vanc and flagyl Plan: -broad  spectrum abx -f/u cultures -cont home midodrine 15 tid -cont levo for map goal >60 -trend LA -check pct and cortisol -hold on further iv fluids for now as appears euvolemic -will consulted ID; consider MRI   ESRD on HD Hyponatremia: likely hypervolemia related given missed HD today Hyperkalemia AGMA Plan: -nephro consulted -will likely need CRRT given hypotension -Trend BMP / urinary output -Replace electrolytes as indicated -Avoid nephrotoxic agents, ensure adequate renal perfusion  Hx of PAF Plan: -cont  eliquis -telemetry monitoring   HLD Chronic HFpEF Plan: -dailys weights: strict I/O's -HD for volume removal -hold statin given slight elevation in LFTs  Anemia of chronic ESRD Plan: -trend cbc  Mild transaminitis Plan: -trend cmp  OSA not on cpap Plan: -cpap qhs -sleep study outpt   Best Practice (right click and "Reselect all SmartList Selections" daily)   Diet/type: clear liquids DVT prophylaxis: DOAC GI prophylaxis: N/A Lines: Dialysis Catheter Foley:  N/A Code Status:  full code Last date of multidisciplinary goals of care discussion [8/20 patient updated at bedside]  Labs   CBC: Recent Labs  Lab 01/27/23 1114  WBC 5.6  NEUTROABS 4.4  HGB 8.5*  HCT 26.8*  MCV 86.7  PLT 214    Basic Metabolic Panel: Recent Labs  Lab 01/27/23 1114  NA 132*  K 5.6*  CL 95*  CO2 19*  GLUCOSE 95  BUN 92*  CREATININE 19.73*  CALCIUM 8.4*   GFR: CrCl cannot be calculated (Unknown ideal weight.). Recent Labs  Lab 01/27/23 1114 01/27/23 1125  WBC 5.6  --   LATICACIDVEN  --  2.2*    Liver Function Tests: Recent Labs  Lab 01/27/23 1114  AST 50*  ALT 53*  ALKPHOS 149*  BILITOT 0.8  PROT 7.5  ALBUMIN 2.9*   No results for input(s): "LIPASE", "AMYLASE" in the last 168 hours. No results for input(s): "AMMONIA" in the last 168 hours.  ABG    Component Value Date/Time   PHART 7.415 12/06/2018 1420   PCO2ART 41.3 12/06/2018 1420   PO2ART 102.0 12/06/2018 1420   HCO3 26.5 12/06/2018 1420   TCO2 27 12/31/2022 1759   ACIDBASEDEF 8.0 (H) 12/01/2018 1656   O2SAT 98.0 12/06/2018 1420     Coagulation Profile: No results for input(s): "INR", "PROTIME" in the last 168 hours.  Cardiac Enzymes: No results for input(s): "CKTOTAL", "CKMB", "CKMBINDEX", "TROPONINI" in the last 168 hours.  HbA1C: Hgb A1c MFr Bld  Date/Time Value Ref Range Status  12/06/2022 10:53 AM 5.7 (H) 4.8 - 5.6 % Final    Comment:    (NOTE)         Prediabetes: 5.7 - 6.4          Diabetes: >6.4         Glycemic control for adults with diabetes: <7.0   12/11/2021 10:58 AM 5.3 4.6 - 6.5 % Final    Comment:    Glycemic Control Guidelines for People with Diabetes:Non Diabetic:  <6%Goal of Therapy: <7%Additional Action Suggested:  >8%     CBG: No results for input(s): "GLUCAP" in the last 168 hours.  Review of Systems:   Review of Systems  Constitutional:  Negative for fever.  Respiratory:  Negative for cough, sputum production, shortness of breath and wheezing.   Cardiovascular:  Negative for chest pain.  Gastrointestinal:  Negative for abdominal pain, diarrhea, nausea and vomiting.     Past Medical History:  He,  has a past medical history of Acute respiratory failure with hypoxia (HCC) (11/30/2018),  Anemia, ESRD on hemodialysis (HCC) (06/07/2011), History of blood transfusion, Hypertension, Morbid obesity (HCC), Paroxysmal atrial fibrillation (HCC), Pre-diabetes, Sepsis (HCC) (11/30/2018), and Sleep apnea.   Surgical History:   Past Surgical History:  Procedure Laterality Date   AMPUTATION Right 12/12/2022   Procedure: AMPUTATION RAY;  Surgeon: Louann Sjogren, DPM;  Location: [facility] OR;  Service: Orthopedics/Podiatry;  Laterality: Right;  surgical team to do local block   ARTERIOVENOUS GRAFT PLACEMENT  08/09/2010   Left Thigh Graft by Dr. Cari Caraway   AV FISTULA PLACEMENT Right 05/18/2018   Procedure: INSERTION OF ARTERIOVENOUS (AV) GORE-TEX GRAFT Left THIGH;  Surgeon: Maeola Harman, MD;  Location: [facility] OR;  Service: Vascular;  Laterality: Right;   AV FISTULA PLACEMENT Right 02/15/2021   Procedure: INSERTION OF ARTERIOVENOUS (AV) GORE-TEX LOOP GRAFT RIGHT THIGH;  Surgeon: Maeola Harman, MD;  Location: [facility] OR;  Service: Vascular;  Laterality: Right;   BIOPSY  05/20/2022   Procedure: BIOPSY;  Surgeon: Shellia Cleverly, DO;  Location: [facility] END[facility]COPY;  Service: Gastroenterology;;   CAPD INSERTION N/A 06/13/2022   Procedure: LAPAROSCOPIC  INSERTION CONTINUOUS AMBULATORY PERITONEAL DIALYSIS  (CAPD) CATHETER;  Surgeon: Maeola Harman, MD;  Location[facility] OR;  Service: Vascular;  Laterality: N/A;   CAPD REMOVAL  05/08/2011   Procedure: CONTINUOUS AMBULATORY PERITONEAL DIALYSIS  (CAPD) CATHETER REMOVAL;  Surgeon: Iona Coach, MD;  Location: [facility] OR;  Service: General;  Laterality: N/A;  Removal of CAPD catheter, Dr. requests to go after 100   CAPD REMOVAL N/A 08/08/2022   Procedure: REMOVAL CONTINUOUS AMBULATORY PERITONEAL DIALYSIS  (CAPD) CATHETER;  Surgeon: Maeola Harman, MD;  Location: [facility] OR;  Service: Vascular;  Laterality: N/A;   COLONOSCOPY WITH PROPOFOL N/A 05/20/2022   Procedure: COLONOSCOPY WITH PROPOFOL;  Surgeon: Shellia Cleverly, DO;  Location: [facility];  Service: Gastroenterology;  Laterality: N/A;   I & D EXTREMITY Right 12/08/2022   Procedure: IRRIGATION AND DEBRIDEMENT RIGHT FOOT;  Surgeon: Louann Sjogren, DPM;  Location: [facility] OR;  Service: Orthopedics/Podiatry;  Laterality: Right;   INSERTION OF DIALYSIS CATHETER  10/05/2010   Right Femoral Cath insertion by Dr. Leonides Sake.  Pt ahas had several caths inserted.   INSERTION OF DIALYSIS CATHETER Right 02/15/2021   Procedure: Attempted INSERTION OF RIGHT and Left Internal Jugular DIALYSIS CATHETER, Insertion of Left Femoral Vein Dialysis Catheter;  Surgeon: Maeola Harman, MD;  Location: [facility] OR;  Service: Vascular;  Laterality: Right;   INSERTION OF DIALYSIS CATHETER Right 04/26/2021   Procedure: INSERTION OF TUNNELED 55cm PALIDROME PRECISION CHRONIC DIALYSIS CATHETER;  Surgeon: Leonie Douglas, MD;  Location: [facility] OR;  Service: Vascular;  Laterality: Right;   INSERTION OF DIALYSIS CATHETER Left 12/26/2021   Procedure: INSERTION OF TUNNELED  DIALYSIS CATHETER LEFT FEMORAL ARTERY;  Surgeon: Maeola Harman, MD;  Location: [facility] OR;  Service: Vascular;  Laterality: Left;   INSERTION OF DIALYSIS CATHETER Left 03/06/2022   Procedure:  INSERTION OF DIALYSIS CATHETER;  Surgeon: Maeola Harman, MD;  Location: [facility] OR;  Service: Vascular;  Laterality: Left;   INSERTION OF DIALYSIS CATHETER Left 12/31/2022   Procedure: INSERTION OF DIALYSIS CATHETER LEFT GROIN;  Surgeon: Leonie Douglas, MD;  Location: [facility] OR;  Service: Vascular;  Laterality: Left;   IR FLUORO GUIDE CV LINE LEFT  04/23/2022   IR FLUORO GUIDE CV LINE RIGHT  05/11/2018   IR FLUORO GUIDE CV LINE RIGHT  07/03/2021   IR FLUORO GUIDE CV LINE RIGHT  07/16/2021   IR PTA ADDL  CENTRAL DIALYSIS SEG THRU DIALY CIRCUIT RIGHT Right 07/03/2021   IR US GUIDE VASC ACCESS LEFT  04/23/2022   IR US GUIDE VASC ACCESS RIGHT  05/11/2018   IR VENO/EXT/UNI LEFT  04/23/2022   IR VENOCAVAGRAM IVC  07/16/2021   KIDNEY TRANSPLANT  2014   KNEE ARTHROSCOPY Left    LAPAROSCOPIC LYSIS OF ADHESIONS N/A 06/13/2022   Procedure: LAPAROSCOPIC LYSIS OF ADHESIONS;  Surgeon: Maeola Harman, MD;  Location: [facility] OR;  Service: Vascular;  Laterality: N/A;   POLYPECTOMY  05/20/2022   Procedure: POLYPECTOMY;  Surgeon: Shellia Cleverly, DO;  Location: [facility];  Service: Gastroenterology;;   THROMBECTOMY W/ EMBOLECTOMY Right 04/26/2021   Procedure: REMOVAL OF RIGHT THIGH ARTERIOVENOUS GORE-TEX GRAFT AND REPAIR OF RIGHT COMMON FEMORAL ARTERY;  Surgeon: Leonie Douglas, MD;  Location: [facility] OR;  Service: Vascular;  Laterality: Right;   UPPER EXTREMITY ANGIOGRAPHY Bilateral 05/13/2018   Procedure: UPPER EXTREMITY ANGIOGRAPHY - bilarteral;  Surgeon: Cephus Shelling, MD;  Location: [facility] INVASIVE CV LAB;  Service: Cardiovascular;  Laterality: Bilateral;     Social History:   reports that he quit smoking about 7 years ago. His smoking use included cigarettes. He has never used smokeless tobacco. He reports that he does not currently use alcohol. He reports that he does not currently use drugs after having used the following drugs: Marijuana.   Family History:  His family history includes  Diabetes in his father; Stroke in his father and maternal grandmother. There is no history of Anesthesia problems.   Allergies No Known Allergies   Home Medications  Prior to Admission medications   Medication Sig Start Date End Date Taking? Authorizing Provider  midodrine (PROAMATINE) 5 MG tablet Take 15 mg by mouth 3 (three) times daily. 01/20/23  Yes [provider]  apixaban (ELIQUIS) 5 MG TABS tablet Take 5 mg by mouth 2 (two) times daily. 10/10/21   [provider]  atorvastatin (LIPITOR) 40 MG tablet Take 1 tablet (40 mg total) by mouth daily. 12/20/21   [name], MD  B Complex-C-Zn-Folic Acid (DIALYVITE/ZINC) TABS Take 1 tablet by mouth daily. 01/16/22   [provider]  Calcium Carb-Cholecalciferol (CALCIUM 600 + D PO) Take 2 tablets by mouth daily.    [provider]  ferric citrate (AURYXIA) 1 GM 210 MG(Fe) tablet Take 210 mg by mouth See admin instructions. Take 3 tablets 3 times daily and 1 tablet twice daily with meals 09/06/19   [provider]     Critical care time: 45 minutes    JD Anselm Lis [facility] 01/27/2023, 1:32 PM  Please see [email or web address] for pager details.  From 7A-7P if no response, please call [phone]. After hours, please call ELink [phone].

## 2023-01-27 NOTE — Progress Notes (Signed)
ED Pharmacy Antibiotic Sign Off An antibiotic consult was received from an ED provider for Vancomycin per pharmacy dosing for osteomyelitis. A chart review was completed to assess appropriateness.   Patient is ESRD on HD  The following one time order(s) were placed:  Vancomycin 2500mg  IV once  Further antibiotic and/or antibiotic pharmacy consults should be ordered by the admitting provider if indicated.   Thank you for allowing pharmacy to be a part of this patient's care.   Eldridge Scot, PharmD, BCCCP Clinical Pharmacist 01/27/2023, 11:08 AM

## 2023-01-28 DIAGNOSIS — N186 End stage renal disease: Secondary | ICD-10-CM | POA: Diagnosis not present

## 2023-01-28 DIAGNOSIS — Z7901 Long term (current) use of anticoagulants: Secondary | ICD-10-CM

## 2023-01-28 DIAGNOSIS — B964 Proteus (mirabilis) (morganii) as the cause of diseases classified elsewhere: Secondary | ICD-10-CM | POA: Diagnosis not present

## 2023-01-28 DIAGNOSIS — A419 Sepsis, unspecified organism: Secondary | ICD-10-CM | POA: Diagnosis not present

## 2023-01-28 DIAGNOSIS — I480 Paroxysmal atrial fibrillation: Secondary | ICD-10-CM | POA: Diagnosis not present

## 2023-01-28 DIAGNOSIS — R7881 Bacteremia: Secondary | ICD-10-CM | POA: Diagnosis not present

## 2023-01-28 LAB — BLOOD CULTURE ID PANEL (REFLEXED) - BCID2

## 2023-01-28 LAB — COMPREHENSIVE METABOLIC PANEL
ALT: 46 U/L — ABNORMAL HIGH (ref 0–44)
AST: 34 U/L (ref 15–41)
Albumin: 2.9 g/dL — ABNORMAL LOW (ref 3.5–5.0)
Alkaline Phosphatase: 120 U/L (ref 38–126)
Anion gap: 20 — ABNORMAL HIGH (ref 5–15)
BUN: 99 mg/dL — ABNORMAL HIGH (ref 6–20)
CO2: 16 mmol/L — ABNORMAL LOW (ref 22–32)
Calcium: 8.6 mg/dL — ABNORMAL LOW (ref 8.9–10.3)
Chloride: 94 mmol/L — ABNORMAL LOW (ref 98–111)
Creatinine, Ser: 19.76 mg/dL — ABNORMAL HIGH (ref 0.61–1.24)
GFR, Estimated: 3 mL/min — ABNORMAL LOW (ref >60)
Glucose, Bld: 103 mg/dL — ABNORMAL HIGH (ref 70–99)
Potassium: 5.9 mmol/L — ABNORMAL HIGH (ref 3.5–5.1)
Sodium: 130 mmol/L — ABNORMAL LOW (ref 135–145)
Total Bilirubin: 1.2 mg/dL (ref 0.3–1.2)
Total Protein: 7.2 g/dL (ref 6.5–8.1)

## 2023-01-28 LAB — CBC
HCT: 29.9 % — ABNORMAL LOW (ref 39.0–52.0)
Hemoglobin: 9.5 g/dL — ABNORMAL LOW (ref 13.0–17.0)
MCH: 28.2 pg (ref 26.0–34.0)
MCHC: 31.8 g/dL (ref 30.0–36.0)
MCV: 88.7 fL (ref 80.0–100.0)
Platelets: 227 10*3/uL (ref 150–400)
RBC: 3.37 MIL/uL — ABNORMAL LOW (ref 4.22–5.81)
RDW: 18.6 % — ABNORMAL HIGH (ref 11.5–15.5)
WBC: 9.2 10*3/uL (ref 4.0–10.5)
nRBC: 0 % (ref 0.0–0.2)

## 2023-01-28 LAB — VANCOMYCIN, RANDOM: Vancomycin Rm: 32 ug/mL

## 2023-01-28 LAB — APTT: aPTT: 45 seconds — ABNORMAL HIGH (ref 24–36)

## 2023-01-28 LAB — HEPATITIS B SURFACE ANTIGEN: Hepatitis B Surface Ag: NONREACTIVE

## 2023-01-28 LAB — GLUCOSE, CAPILLARY
Glucose-Capillary: 74 mg/dL (ref 70–99)
Glucose-Capillary: 102 mg/dL — ABNORMAL HIGH (ref 70–99)
Glucose-Capillary: 103 mg/dL — ABNORMAL HIGH (ref 70–99)
Glucose-Capillary: 85 mg/dL (ref 70–99)

## 2023-01-28 LAB — HEPARIN LEVEL (UNFRACTIONATED): Heparin Unfractionated: >1.1 [IU]/mL — ABNORMAL HIGH (ref 0.30–0.70)

## 2023-01-28 LAB — MAGNESIUM: Magnesium: 2.1 mg/dL (ref 1.7–2.4)

## 2023-01-28 MED ORDER — NEPRO/CARBSTEADY PO LIQD: 237.0000 mL | ORAL | Status: DC | PRN

## 2023-01-28 MED ORDER — HEPARIN SODIUM (PORCINE) 1000 UNIT/ML DIALYSIS: 2500.0000 [IU] | INTRAMUSCULAR | Status: DC | PRN

## 2023-01-28 MED ORDER — LIDOCAINE HCL (PF) 1 % IJ SOLN: 5.0000 mL | INTRAMUSCULAR | Status: DC | PRN

## 2023-01-28 MED ORDER — HEPARIN SODIUM (PORCINE) 1000 UNIT/ML IJ SOLN
4200.0000 [IU] | Freq: Once | INTRAMUSCULAR | Status: AC
  Administered 2023-01-28: 4200 [IU]
  Filled 2023-01-28: qty 4.2
  Filled 2023-01-28: qty 5

## 2023-01-28 MED ORDER — HEPARIN SODIUM (PORCINE) 1000 UNIT/ML DIALYSIS: 5000.0000 [IU] | Freq: Once | INTRAMUSCULAR | Status: DC

## 2023-01-28 MED ORDER — HEPARIN BOLUS VIA INFUSION
5000.0000 [IU] | Freq: Once | INTRAVENOUS | Status: AC
  Administered 2023-01-28: 5000 [IU] via INTRAVENOUS
  Filled 2023-01-28: qty 5000

## 2023-01-28 MED ORDER — LIDOCAINE-PRILOCAINE 2.5-2.5 % EX CREA: 1.0000 | TOPICAL_CREAM | CUTANEOUS | Status: DC | PRN

## 2023-01-28 MED ORDER — PENTAFLUOROPROP-TETRAFLUOROETH EX AERO: 1.0000 | INHALATION_SPRAY | CUTANEOUS | Status: DC | PRN

## 2023-01-28 MED ORDER — HEPARIN SODIUM (PORCINE) 1000 UNIT/ML DIALYSIS: 1000.0000 [IU] | INTRAMUSCULAR | Status: DC | PRN

## 2023-01-28 MED ORDER — ALBUMIN HUMAN 25 % IV SOLN: 12.5000 g | Freq: Once | INTRAVENOUS | Status: DC

## 2023-01-28 MED ORDER — HEPARIN (PORCINE) 25000 UT/250ML-% IV SOLN
1850.0000 [IU]/h | INTRAVENOUS | Status: DC
  Administered 2023-01-28: 1550 [IU]/h via INTRAVENOUS
  Filled 2023-01-28 (×2): qty 250

## 2023-01-28 MED ORDER — ALBUMIN HUMAN 25 % IV SOLN
25.0000 g | Freq: Once | INTRAVENOUS | Status: AC
  Administered 2023-01-29: 25 g via INTRAVENOUS
  Filled 2023-01-28 (×2): qty 100

## 2023-01-28 MED ORDER — ALTEPLASE 2 MG IJ SOLR: 2.0000 mg | Freq: Once | INTRAMUSCULAR | Status: DC | PRN

## 2023-01-28 MED ORDER — SODIUM CHLORIDE 0.9 % IV SOLN
1.0000 g | INTRAVENOUS | Status: DC
  Administered 2023-01-29: 1 g via INTRAVENOUS
  Filled 2023-01-28 (×3): qty 10

## 2023-01-28 MED ORDER — CHLORHEXIDINE GLUCONATE CLOTH 2 % EX PADS: 6.0000 | MEDICATED_PAD | Freq: Every day | CUTANEOUS | Status: DC

## 2023-01-28 MED ORDER — ANTICOAGULANT SODIUM CITRATE 4% (200MG/5ML) IV SOLN: 5.0000 mL | Status: DC | PRN

## 2023-01-28 NOTE — Consult Note (Signed)
[facility]    Date of Admission:  01/27/2023   Total days of inpatient antibiotics 1        Reason for Consult: Bacteremia    Active Problems:   Paroxysmal atrial fibrillation (HCC)   Septic shock [facility])   Assessment: 47 year old male with history of MRSA and Bacteroides osteomyelitis status post first ray amputation treated with vancomycin and metronidazole, ESRD on HD via Pham cath admitted with #Proteus bacteremia - Blood culture 8/20 grew 1 out of 2 Proteus #ESRD via HD via left fem line #Open wound at first ray amputation site - Patient was treated with vancomycin and metronidazole completed treatment on 01/09/2023 - Although no signs of infection per ID visit on 8/6 was open, pt had not seen podiatry at that point. - Etiology of bacteremia is unclear, I suspect its either the right foot versus fem line.  Recommendations:  -MRI right foot - Consider femoral HD cath removal - Follow-up blood cultures - Continue vancomycin, cefepime, metronidazole for now Microbiology:   Antibiotics: Vancomycin 8/20- Metronidazole 8/20- Cefepime 8/20-  Cultures: Blood 8/20-1/2 Proteus Urine  Other   HPI: [name] is a 47 y.o. male with right first ray osteomyelitis status post amputation with cultures growing MRSA proctitis treated with vancomycin and metronidazole x 4-week which ended around beginning of October with an open ulcer, ESRD on HD via femoral cath, PAF on Eliquis, chronic hypotension admitted to [facility], ED from dialysis on 8/20 after developing fever and becoming hypotensive.  Patient had temp of 102.9, WBC 9.2K. He was started on broad-spectrum antibiotics.  Right foot x-ray showed amputation of great toe tube mid metatarsal shaft, no definite new cortical erosion.  Found to have Proteus bacteremia.  ID engaged.  Review of Systems: Review of Systems  All other systems reviewed and are negative.   Past Medical History:   Diagnosis Date   Acute respiratory failure with hypoxia (HCC) 11/30/2018   Anemia    ESRD   ESRD on hemodialysis (HCC) 06/07/2011   TTS [facility]. Started HD in 2006, got transplant in 2014 lasted until Dec 2019 then went back on HD.  Has L thigh AVG as of Jun 2020.  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    History of blood transfusion    Hypertension    03/19/22- has not had high blood pressure in 5 years.   Morbid obesity (HCC)    Paroxysmal atrial fibrillation (HCC)    Pre-diabetes    no meds   Sepsis (HCC) 11/30/2018   Sleep apnea    lost weight, does not use CPAP    Social History   Tobacco Use   Smoking status: Former    Current packs/day: 0.00    Types: Cigarettes    Quit date: 11/08/2015    Years since quitting: 7.2   Smokeless tobacco: Never  Vaping Use   Vaping status: Never Used  Substance Use Topics   Alcohol use: Not Currently   Drug use: Not Currently    Types: Marijuana    Comment: Last use was in 04/2022    Family History  Problem Relation Age of Onset   Diabetes Father    Stroke Father    Stroke Maternal Grandmother    Anesthesia problems Neg Hx    Scheduled Meds:  Chlorhexidine Gluconate Cloth  6 each Topical Daily   midodrine  15 mg Oral TID WC  Continuous Infusions:  sodium chloride Stopped (01/28/23 0457)   albumin human     anticoagulant sodium citrate     [START ON 01/29/2023] ceFEPime (MAXIPIME) IV     heparin 1,550 Units/hr (01/28/23 1200)   PRN Meds:.acetaminophen, alteplase, anticoagulant sodium citrate, docusate sodium, feeding supplement (NEPRO CARB STEADY), heparin, heparin, lidocaine (PF), lidocaine-prilocaine, mouth rinse, pentafluoroprop-tetrafluoroeth, polyethylene glycol No Known Allergies  OBJECTIVE: Blood pressure 93/70, pulse 70, temperature 98.4 F (36.9 C), temperature source Oral, resp. rate 14, height 6\' 2"  (1.88 m), weight (!) 139.7 kg, SpO2 100%.  Physical Exam Constitutional:      General: He is  not in acute distress.    Appearance: He is normal weight. He is not toxic-appearing.  HENT:     Head: Normocephalic and atraumatic.     Right Ear: External ear normal.     Left Ear: External ear normal.     Nose: No congestion or rhinorrhea.     Mouth/Throat:     Mouth: Mucous membranes are moist.     Pharynx: Oropharynx is clear.  Eyes:     Extraocular Movements: Extraocular movements intact.     Conjunctiva/sclera: Conjunctivae normal.     Pupils: Pupils are equal, round, and reactive to light.  Cardiovascular:     Rate and Rhythm: Normal rate and regular rhythm.     Heart sounds: No murmur heard.    No friction rub. No gallop.  Pulmonary:     Effort: Pulmonary effort is normal.     Breath sounds: Normal breath sounds.  Abdominal:     General: Abdomen is flat. Bowel sounds are normal.     Palpations: Abdomen is soft.  Musculoskeletal:        General: No swelling.     Cervical back: Normal range of motion and neck supple.     Comments: Right foot open wound  Skin:    General: Skin is warm and dry.  Neurological:     General: No focal deficit present.     Mental Status: He is oriented to person, place, and time.  Psychiatric:        Mood and Affect: Mood normal.     Lab Results Lab Results  Component Value Date   WBC 9.2 01/28/2023   HGB 9.5 (L) 01/28/2023   HCT 29.9 (L) 01/28/2023   MCV 88.7 01/28/2023   PLT 227 01/28/2023    Lab Results  Component Value Date   CREATININE 19.76 (H) 01/28/2023   BUN 99 (H) 01/28/2023   NA 130 (L) 01/28/2023   K 5.9 (H) 01/28/2023   CL 94 (L) 01/28/2023   CO2 16 (L) 01/28/2023    Lab Results  Component Value Date   ALT 46 (H) 01/28/2023   AST 34 01/28/2023   ALKPHOS 120 01/28/2023   BILITOT 1.2 01/28/2023       Danelle Earthly, MD [facility] 01/28/2023, 1:15 PM I have personally spent 84 minutes involved in face-to-face and non-face-to-face activities for this  patient on the day of the visit. Professional time spent includes the following activities: Preparing to see the patient (review of tests), Obtaining and/or reviewing separately obtained history (admission/discharge record), Performing a medically appropriate examination and/or evaluation , Ordering medications/tests/procedures, referring and communicating with other health care professionals, Documenting clinical information in the EMR, Independently interpreting results (not separately reported), Communicating results to the patient/family/caregiver, Counseling and educating the patient/family/caregiver and Care coordination (not separately reported).

## 2023-01-28 NOTE — Consult Note (Signed)
[facility] NOTE   MRN : [id number]  Reason for Consult: ESRD catheter infection Referring Physician: Nephrology  History of Present Illness: 47 y/o catheter dependent male with ESRD.  He had the current catheter Exchanged on the  left femoral by Dr. Lenell Antu on 12/31/22.  He has a plan for Hero graft placement on 02/16/23 by Dr. Remo Lipps @ [facility].  He presented to the ED with Acute on chronic hypotension.  Pressors were started and pt admitted to ICU, blood cx's sent and IV abx were started.  He is now off pressors.  Blood cultures are positive and we have been asked to assist with access for HD.      Current Facility-Administered Medications  Medication Dose Route Frequency Provider Last Rate Last Admin   0.9 %  sodium chloride infusion  250 mL Intravenous Continuous Jacalyn Lefevre, MD   Stopped at 01/28/23 0457   acetaminophen (TYLENOL) tablet 650 mg  650 mg Oral Q4H PRN Lidia Collum, PA-C       [START ON 01/29/2023] ceFEPIme (MAXIPIME) 1 g in sodium chloride 0.9 % 100 mL IVPB  1 g Intravenous Q24H Knute Neu, RPH       Chlorhexidine Gluconate Cloth 2 % PADS 6 each  6 each Topical Daily Lynnell Catalan, MD   6 each at 01/27/23 1520   docusate sodium (COLACE) capsule 100 mg  100 mg Oral BID PRN Lidia Collum, PA-C       heparin ADULT infusion 100 units/mL (25000 units/266mL)  1,550 Units/hr Intravenous Continuous Knute Neu, RPH       heparin bolus via infusion 5,000 Units  5,000 Units Intravenous Once Knute Neu, RPH       midodrine (PROAMATINE) tablet 15 mg  15 mg Oral TID WC Pia Mau D, PA-C   15 mg at 01/28/23 4098   Oral care mouth rinse  15 mL Mouth Rinse PRN Agarwala, Daleen Bo, MD       polyethylene glycol (MIRALAX / GLYCOLAX) packet 17 g  17 g Oral Daily PRN Pia Mau D, PA-C        Pt meds include: Statin :Yes Betablocker: No ASA: No Other anticoagulants/antiplatelets: Eliquis  Past Medical History:  Diagnosis Date   Acute  respiratory failure with hypoxia (HCC) 11/30/2018   Anemia    ESRD   ESRD on hemodialysis (HCC) 06/07/2011   TTS [facility]. Started HD in 2006, got transplant in 2014 lasted until Dec 2019 then went back on HD.  Has L thigh AVG as of Jun 2020.  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    History of blood transfusion    Hypertension    03/19/22- has not had high blood pressure in 5 years.   Morbid obesity (HCC)    Paroxysmal atrial fibrillation (HCC)    Pre-diabetes    no meds   Sepsis (HCC) 11/30/2018   Sleep apnea    lost weight, does not use CPAP    Past Surgical History:  Procedure Laterality Date   AMPUTATION Right 12/12/2022   Procedure: AMPUTATION RAY;  Surgeon: Louann Sjogren, DPM;  Location: [facility] OR;  Service: Orthopedics/Podiatry;  Laterality: Right;  surgical team to do local block   ARTERIOVENOUS GRAFT PLACEMENT  08/09/2010   Left Thigh Graft by Dr. Cari Caraway   AV FISTULA PLACEMENT Right 05/18/2018   Procedure: INSERTION OF ARTERIOVENOUS (AV) GORE-TEX GRAFT Left THIGH;  Surgeon: Maeola Harman, MD;  Location: [facility] OR;  Service: Vascular;  Laterality: Right;   AV FISTULA PLACEMENT Right 02/15/2021   Procedure: INSERTION OF ARTERIOVENOUS (AV) GORE-TEX LOOP GRAFT RIGHT THIGH;  Surgeon: Maeola Harman, MD;  Location: [facility] OR;  Service: Vascular;  Laterality: Right;   BIOPSY  05/20/2022   Procedure: BIOPSY;  Surgeon: Shellia Cleverly, DO;  Location: [facility];  Service: Gastroenterology;;   CAPD INSERTION N/A 06/13/2022   Procedure: LAPAROSCOPIC INSERTION CONTINUOUS AMBULATORY PERITONEAL DIALYSIS  (CAPD) CATHETER;  Surgeon: Maeola Harman, MD;  Location: [facility] OR;  Service: Vascular;  Laterality: N/A;   CAPD REMOVAL  05/08/2011   Procedure: CONTINUOUS AMBULATORY PERITONEAL DIALYSIS  (CAPD) CATHETER REMOVAL;  Surgeon: Iona Coach, MD;  Location: [facility] OR;  Service: General;  Laterality: N/A;  Removal of CAPD catheter, Dr. requests to go  after 100   CAPD REMOVAL N/A 08/08/2022   Procedure: REMOVAL CONTINUOUS AMBULATORY PERITONEAL DIALYSIS  (CAPD) CATHETER;  Surgeon: Maeola Harman, MD;  Location: [facility] OR;  Service: Vascular;  Laterality: N/A;   COLONOSCOPY WITH PROPOFOL N/A 05/20/2022   Procedure: COLONOSCOPY WITH PROPOFOL;  Surgeon: Shellia Cleverly, DO;  Location: [facility];  Service: Gastroenterology;  Laterality: N/A;   I & D EXTREMITY Right 12/08/2022   Procedure: IRRIGATION AND DEBRIDEMENT RIGHT FOOT;  Surgeon: Louann Sjogren, DPM;  Location: [facility] OR;  Service: Orthopedics/Podiatry;  Laterality: Right;   INSERTION OF DIALYSIS CATHETER  10/05/2010   Right Femoral Cath insertion by Dr. Leonides Sake.  Pt ahas had several caths inserted.   INSERTION OF DIALYSIS CATHETER Right 02/15/2021   Procedure: Attempted INSERTION OF RIGHT and Left Internal Jugular DIALYSIS CATHETER, Insertion of Left Femoral Vein Dialysis Catheter;  Surgeon: Maeola Harman, MD;  Location: [facility] OR;  Service: Vascular;  Laterality: Right;   INSERTION OF DIALYSIS CATHETER Right 04/26/2021   Procedure: INSERTION OF TUNNELED 55cm PALIDROME PRECISION CHRONIC DIALYSIS CATHETER;  Surgeon: Leonie Douglas, MD;  Location: [facility];  Service: Vascular;  Laterality: Right;   INSERTION OF DIALYSIS CATHETER Left 12/26/2021   Procedure: INSERTION OF TUNNELED  DIALYSIS CATHETER LEFT FEMORAL ARTERY;  Surgeon: Maeola Harman, MD;  Location: [facility] OR;  Service: Vascular;  Laterality: Left;   INSERTION OF DIALYSIS CATHETER Left 03/06/2022   Procedure: INSERTION OF DIALYSIS CATHETER;  Surgeon: Maeola Harman, MD;  Location: [facility] OR;  Service: Vascular;  Laterality: Left;   INSERTION OF DIALYSIS CATHETER Left 12/31/2022   Procedure: INSERTION OF DIALYSIS CATHETER LEFT GROIN;  Surgeon: Leonie Douglas, MD;  Location: [facility] OR;  Service: Vascular;  Laterality: Left;   IR FLUORO GUIDE CV LINE LEFT  04/23/2022   IR FLUORO GUIDE CV LINE RIGHT  05/11/2018    IR FLUORO GUIDE CV LINE RIGHT  07/03/2021   IR FLUORO GUIDE CV LINE RIGHT  07/16/2021   IR PTA ADDL CENTRAL DIALYSIS SEG THRU DIALY CIRCUIT RIGHT Right 07/03/2021   IR US GUIDE VASC ACCESS LEFT  04/23/2022   IR US GUIDE VASC ACCESS RIGHT  05/11/2018   IR VENO/EXT/UNI LEFT  04/23/2022   IR VENOCAVAGRAM IVC  07/16/2021   KIDNEY TRANSPLANT  2014   KNEE ARTHROSCOPY Left    LAPAROSCOPIC LYSIS OF ADHESIONS N/A 06/13/2022   Procedure: LAPAROSCOPIC LYSIS OF ADHESIONS;  Surgeon: Maeola Harman, MD;  Location: [facility] OR;  Service: Vascular;  Laterality: N/A;   POLYPECTOMY  05/20/2022   Procedure: POLYPECTOMY;  Surgeon: Shellia Cleverly, DO;  Location: [facility];  Service: Gastroenterology;;   Bonnetta Barry  W/ EMBOLECTOMY Right 04/26/2021   Procedure: REMOVAL OF RIGHT THIGH ARTERIOVENOUS GORE-TEX GRAFT AND REPAIR OF RIGHT COMMON FEMORAL ARTERY;  Surgeon: Leonie Douglas, MD;  Location: [facility] OR;  Service: Vascular;  Laterality: Right;   UPPER EXTREMITY ANGIOGRAPHY Bilateral 05/13/2018   Procedure: UPPER EXTREMITY ANGIOGRAPHY - bilarteral;  Surgeon: Cephus Shelling, MD;  Location: [facility] INV[facility];  Service: Cardiovascular;  Laterality: Bilateral;    Social History Social History   Tobacco Use   Smoking status: Former    Current packs/day: 0.00    Types: Cigarettes    Quit date: 11/08/2015    Years since quitting: 7.2   Smokeless tobacco: Never  Vaping Use   Vaping status: Never Used  Substance Use Topics   Alcohol use: Not Currently   Drug use: Not Currently    Types: Marijuana    Comment: Last use was in 04/2022    Family History Family History  Problem Relation Age of Onset   Diabetes Father    Stroke Father    Stroke Maternal Grandmother    Anesthesia problems Neg Hx     No Known Allergies   REVIEW OF SYSTEMS  General: [ ]  Weight loss, [ ]  Fever, [ ]  chills Neurologic: [ ]  Dizziness, [ ]  Blackouts, [ ]  Seizure [ ]  Stroke, [ ]  "Mini stroke", [ ]  Slurred speech, [ ]   Temporary blindness; [ ]  weakness in arms or legs, [ ]  Hoarseness [ ]  Dysphagia Cardiac: [ ]  Chest pain/pressure, [ ]  Shortness of breath at rest [ ]  Shortness of breath with exertion, [ ]  Atrial fibrillation or irregular heartbeat  Vascular: [ ]  Pain in legs with walking, [ ]  Pain in legs at rest, [ ]  Pain in legs at night,  [ ]  Non-healing ulcer, [ ]  Blood clot in vein/DVT,   Pulmonary: [ ]  Home oxygen, [ ]  Productive cough, [ ]  Coughing up blood, [ ]  Asthma,  [ ]  Wheezing [ ]  COPD Musculoskeletal:  [ ]  Arthritis, [ ]  Low back pain, [ ]  Joint pain Hematologic: [ ]  Easy Bruising, [ ]  Anemia; [ ]  Hepatitis Gastrointestinal: [ ]  Blood in stool, [ ]  Gastroesophageal Reflux/heartburn, Urinary: [ ]  chronic Kidney disease, [ ]  on HD - [ ]  MWF or [ ]  TTHS, [ ]  Burning with urination, [ ]  Difficulty urinating Skin: [ ]  Rashes, [ ]  Wounds Psychological: [ ]  Anxiety, [ ]  Depression  Physical Examination Vitals:   01/28/23 0906 01/28/23 0930 01/28/23 1000 01/28/23 1030  BP: (!) 87/61 (!) 88/62 (!) 104/90 98/60  Pulse: 90 97 88 80  Resp: 18 (!) 21 16 11   Temp:      TempSrc:      SpO2: 95% 96% 95% 97%  Weight:      Height:       Body mass index is 39.54 kg/m.  General:  WDWN in NAD Gait: Normal HENT: WNL Eyes: Pupils equal Pulmonary: normal non-labored breathing , without Rales, rhonchi,  wheezing Cardiac: RRR, without  Murmurs, rubs or gallops; No carotid bruits Abdomen: soft, NT, no masses Skin: no rashes, ulcers noted;  no Gangrene , no cellulitis; no open wounds;   Feet warm with intact motor   Musculoskeletal: no muscle wasting or atrophy; no edema  Neurologic: A&O X 3; Appropriate Affect ;  SENSATION: normal; MOTOR FUNCTION: grossly intact Symmetric Speech is fluent/normal   Significant Diagnostic Studies: CBC Lab Results  Component Value Date   WBC 9.2 01/28/2023   HGB 9.5 (L) 01/28/2023  HCT 29.9 (L) 01/28/2023   MCV 88.7 01/28/2023   PLT 227 01/28/2023     BMET    Component Value Date/Time   NA 130 (L) 01/28/2023 0630   NA 137 05/26/2018 0000   K 5.9 (H) 01/28/2023 0630   CL 94 (L) 01/28/2023 0630   CO2 16 (L) 01/28/2023 0630   GLUCOSE 103 (H) 01/28/2023 0630   BUN 99 (H) 01/28/2023 0630   CREATININE 19.76 (H) 01/28/2023 0630   CALCIUM 8.6 (L) 01/28/2023 0630   GFRNONAA 3 (L) 01/28/2023 0630   GFRAA 5 (L) 12/16/2018 1350   Estimated Creatinine Clearance: 6.8 mL/min (A) (by C-G formula based on SCr of 19.76 mg/dL (H)).  COAG Lab Results  Component Value Date   INR 1.6 (H) 01/27/2023   INR 1.5 (H) 11/30/2018   INR 1.21 05/11/2018     Non-Invasive Vascular Imaging:   ASSESSMENT/PLAN:  ESRD catheter dependent with positive blood cultures. We have been asked to remove the femoral TDC for holiday and then to replace a [facility] for HD.    Eliquis is being held and he is being managed on Heparin.  This is for Afib.    He has a plan for Hero graft placement on 02/16/23 by Dr. Remo Lipps @ [facility].   The patient stated he has now been canceled at [facility].    Plan for HD today.  Dr. Lenell Antu will discuss options later today.   Mosetta Pigeon 01/28/2023 10:56 AM

## 2023-01-28 NOTE — Progress Notes (Addendum)
[name] Progress Note  Subjective: seen in room, blood cx's + for proteus   Vitals:   01/28/23 1235 01/28/23 1300 01/28/23 1330 01/28/23 1402  BP: 93/70  122/75 107/64  Pulse: 70 81 75 74  Resp: 14 (!) 23 (!) 23 18  Temp:      TempSrc:      SpO2: 100% 95% 98% 97%  Weight:      Height:        Exam: Gen alert, no distress No jvd or bruits Chest clear bilat to bases RRR no RG Abd soft obese ntnd no mass or ascites +bs Ext no LE edema Neuro is alert, Ox 3 , nf    L groin TDC intact     Home meds include - lipitor, auryxia 3 ac, midodrine 15 tid, eliquis     CXR 8/20 - no active disease    OP HD: SW TTS  4.5h  500/500  137kg   2/2 bath  TDC L groin  Heparin 5000+ 2500 midrun - last OP HD was this am, got 2 1/4 hrs of dialysis, post wt 140.5kg   - parsabiv 7.5mg  IV  - mircera 100 mcg IV q 2 wks, last 8/06, due 8/20 (today)     Assessment/ Plan: Proteus bacteremia - per bcx's from 8/20. On vanc, maxipime and flagyl. Seen by ID, MRI R foot ordered. Needs line holiday, will consult [facility]VS.   ESRD - on HD TTS. Has 2.5 hrs HD 8/20 at OP unit. Plan HD today off schedule. Addendum: seen by [facility], they are consulting IR due to no other options to place new TDC. CT abd planned for hopeful transhepatic catheter. Volume - up 2-3kg post HD today. No resp distress, CXR clear.  Anemia esrd - Hb 8s, follow, tx prn.  MBD ckd - CCa in range, add on phos. Follow.  Recent chronic osteo R foot - sp R toe amp 7/5, completed 4 wks IV abx PAF OSA   Vinson Moselle MD  [facility] 01/28/2023, 2:14 PM  Recent Labs  Lab 01/27/23 1114 01/27/23 2140 01/28/23 0630  HGB 8.5*  --  9.5*  ALBUMIN 2.9*  --  2.9*  CALCIUM 8.4* 8.4* 8.6*  CREATININE 19.73* 20.17* 19.76*  K 5.6* 5.5* 5.9*   No results for input(s): "IRON", "TIBC", "FERRITIN" in the last 168 hours. Inpatient medications:  Chlorhexidine Gluconate Cloth  6 each Topical Daily   heparin sodium (porcine)  4,200 Units  Intracatheter Once   midodrine  15 mg Oral TID WC    sodium chloride Stopped (01/28/23 0457)   albumin human     anticoagulant sodium citrate     [START ON 01/29/2023] ceFEPime (MAXIPIME) IV     heparin 1,550 Units/hr (01/28/23 1200)   acetaminophen, alteplase, anticoagulant sodium citrate, docusate sodium, feeding supplement (NEPRO CARB STEADY), heparin, heparin, lidocaine (PF), lidocaine-prilocaine, mouth rinse, pentafluoroprop-tetrafluoroeth, polyethylene glycol

## 2023-01-28 NOTE — Progress Notes (Signed)
Received patient in bed.Awake,alert and oriented x 4. Consent verified.  Access used.Right femoral catheter .Drssing on date.  Medicine given: Heparin 5,000 unit pre -run bolus dose.  Duration of treatment 3.5 hours.  Fluid removed : Met 2 liters goal.  Hemo comment: Tolerated treatment.  Hand off to the patient's nurse.

## 2023-01-28 NOTE — Plan of Care (Signed)

## 2023-01-28 NOTE — Progress Notes (Signed)
Pharmacy Antibiotic Note  [name] is a 47 y.o. male admitted on 01/27/2023 with sepsis. Pt has a nonhealing wound on r foot. Pt was recently admitted on 6/29 and had a r toe amputation on 7/5. Discharged on vancomycin and flagyl with an intended 4 week course. On 7/24, HD catheter was not working and pt missed 2 HD sessions Pt is ESRD on HD (T/Th/Sat). WBC wnl, tmax 99.2. Pharmacy has been consulted for vancomycin and cefepime dosing.  Plan: Adjust cefepime to 1g q24h for HD dosing Deescalate to ceftriaxone pending line replacement  Discontinue metronidazole and vancomycin Monitor WBC, temperature, and cultures F/u LOT and renal replacement    Height: 6\' 2"  (188 cm) Weight: (!) 139.7 kg (307 lb 15.7 oz) IBW/kg (Calculated) : 82.2  Temp (24hrs), Avg:99.2 F (37.3 C), Min:97.4 F (36.3 C), Max:102.9 F (39.4 C)  Recent Labs  Lab 01/27/23 1114 01/27/23 1125 01/27/23 1523 01/27/23 1742 01/27/23 2140 01/28/23 0630  WBC 5.6  --   --   --   --  9.2  CREATININE 19.73*  --   --   --  20.17*  --   LATICACIDVEN  --  2.2* 1.0 1.0  --   --     Estimated Creatinine Clearance: 6.7 mL/min (A) (by C-G formula based on SCr of 20.17 mg/dL (H)).    No Known Allergies  Antimicrobials this admission: 8/20 vancomycin  8/20 flagyl 8/20 cefepime >>   Previously  Discharged on 7/25 on vancomycin 1250 mg every t/thur/sat with dialysis (intended for 22 days) and metronidazole 500 mg every 12 hours (intended for 24 days)  Dose adjustments this admission: 8/21 Cefepime 2g q12 to 1g q24 (did not receive CRRT, planning for HD)  Microbiology results: 8/20 BCx: proteus  7/5 R foot cx: MRSA  7/1 wound cx: MRSA   Thank you for allowing pharmacy to be a part of this patient's care.  Rutherford Nail, PharmD PGY2 Critical Care Pharmacy Resident 01/28/2023 7:31 AM

## 2023-01-28 NOTE — Progress Notes (Signed)
PHARMACY - PHYSICIAN COMMUNICATION CRITICAL VALUE ALERT - BLOOD CULTURE IDENTIFICATION (BCID)  [name] is an 47 y.o. male who presented to [facility] on 01/27/2023 with a chief complaint of problems at outpt HD and met sepsis criteria when EMS evaluated him.  Assessment:  Started on broad-spectrum ABX for sepsis after recent ABX for MRSA foot infection that required amputation, now growing Proteus spp in 1 of 4 blood cx bottles.  Name of physician (or Provider) Contacted: OOgan MD  Current antibiotics: vancomycin, cefepime, and metronidazole  Changes to prescribed antibiotics recommended:  Will continue current ABX given organism mismatch to previous wound cx; may consider narrowing to ceftriaxone if other organisms deemed unlikely.  Results for orders placed or performed during the hospital encounter of 01/27/23  Blood Culture ID Panel (Reflexed) (Collected: 01/27/2023 11:00 AM)  Result Value Ref Range   Enterococcus faecalis NOT DETECTED NOT DETECTED   Enterococcus Faecium NOT DETECTED NOT DETECTED   Listeria monocytogenes NOT DETECTED NOT DETECTED   Staphylococcus species NOT DETECTED NOT DETECTED   Staphylococcus aureus (BCID) NOT DETECTED NOT DETECTED   Staphylococcus epidermidis NOT DETECTED NOT DETECTED   Staphylococcus lugdunensis NOT DETECTED NOT DETECTED   Streptococcus species NOT DETECTED NOT DETECTED   Streptococcus agalactiae NOT DETECTED NOT DETECTED   Streptococcus pneumoniae NOT DETECTED NOT DETECTED   Streptococcus pyogenes NOT DETECTED NOT DETECTED   A.calcoaceticus-baumannii NOT DETECTED NOT DETECTED   Bacteroides fragilis NOT DETECTED NOT DETECTED   Enterobacterales DETECTED (A) NOT DETECTED   Enterobacter cloacae complex NOT DETECTED NOT DETECTED   Escherichia coli NOT DETECTED NOT DETECTED   Klebsiella aerogenes NOT DETECTED NOT DETECTED   Klebsiella oxytoca NOT DETECTED NOT DETECTED   Klebsiella pneumoniae NOT DETECTED NOT DETECTED   Proteus  species DETECTED (A) NOT DETECTED   Salmonella species NOT DETECTED NOT DETECTED   Serratia marcescens NOT DETECTED NOT DETECTED   Haemophilus influenzae NOT DETECTED NOT DETECTED   Neisseria meningitidis NOT DETECTED NOT DETECTED   Pseudomonas aeruginosa NOT DETECTED NOT DETECTED   Stenotrophomonas maltophilia NOT DETECTED NOT DETECTED   Candida albicans NOT DETECTED NOT DETECTED   Candida auris NOT DETECTED NOT DETECTED   Candida glabrata NOT DETECTED NOT DETECTED   Candida krusei NOT DETECTED NOT DETECTED   Candida parapsilosis NOT DETECTED NOT DETECTED   Candida tropicalis NOT DETECTED NOT DETECTED   Cryptococcus neoformans/gattii NOT DETECTED NOT DETECTED   CTX-M ESBL NOT DETECTED NOT DETECTED   Carbapenem resistance IMP NOT DETECTED NOT DETECTED   Carbapenem resistance KPC NOT DETECTED NOT DETECTED   Carbapenem resistance NDM NOT DETECTED NOT DETECTED   Carbapenem resist OXA 48 LIKE NOT DETECTED NOT DETECTED   Carbapenem resistance VIM NOT DETECTED NOT DETECTED    Vernard Gambles, PharmD, BCPS  01/28/2023  4:27 AM

## 2023-01-28 NOTE — Progress Notes (Signed)
Pt receives out-pt HD at [facility] [place] on TTS. Will assist as needed.   Olivia Canter Renal Navigator [phone]

## 2023-01-28 NOTE — Progress Notes (Signed)
ANTICOAGULATION CONSULT NOTE - Initial Consult  Pharmacy Consult for heparin infusion Indication: atrial fibrillation  No Known Allergies  Patient Measurements: Height: 6\' 2"  (188 cm) Weight: (!) 139.7 kg (307 lb 15.7 oz) IBW/kg (Calculated) : 82.2 Heparin Dosing Weight: 114 kg  Vital Signs: Temp: 98.6 F (37 C) (08/21 0740) Temp Source: Oral (08/21 0740) BP: 87/61 (08/21 0906) Pulse Rate: 90 (08/21 0906)  Labs: Recent Labs    01/27/23 1114 01/27/23 1523 01/27/23 2140 01/28/23 0630  HGB 8.5*  --   --  9.5*  HCT 26.8*  --   --  29.9*  PLT 214  --   --  227  LABPROT  --  19.2*  --   --   INR  --  1.6*  --   --   CREATININE 19.73*  --  20.17* 19.76*    Estimated Creatinine Clearance: 6.8 mL/min (A) (by C-G formula based on SCr of 19.76 mg/dL (H)).   Medical History: Past Medical History:  Diagnosis Date   Acute respiratory failure with hypoxia (HCC) 11/30/2018   Anemia    ESRD   ESRD on hemodialysis (HCC) 06/07/2011   TTS [facility]. Started HD in 2006, got transplant in 2014 lasted until Dec 2019 then went back on HD.  Has L thigh AVG as of Jun 2020.  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    History of blood transfusion    Hypertension    03/19/22- has not had high blood pressure in 5 years.   Morbid obesity (HCC)    Paroxysmal atrial fibrillation (HCC)    Pre-diabetes    no meds   Sepsis (HCC) 11/30/2018   Sleep apnea    lost weight, does not use CPAP    Medications:  Apixaban 5 mg PO BID prior to admission for atrial fibrillation  Assessment: 47yoM who presented initially for septic shock with hypotension, lactic acid 2.2, requiring fluid resuscitation and vasopressors. Source of infection suspected to be tunneled dialysis catheter requiring possible replacement. Initiating heparin infusion in lieu of chronic apixaban in setting of potential line replacement. Last dose of apixaban 8/19 per patient, will assess both heparin level and aPTT until  correlating.  Goal of Therapy:  Heparin level 0.3-0.7 units/ml aPTT 66 - 102 seconds Monitor platelets by anticoagulation protocol: Yes   Plan:  Give 5000 units bolus x 1 Start heparin infusion at 1550 units/hr Check anti-Xa level in 8 hours and daily while on heparin Continue to monitor H&H and platelets Follow up resumption of apixaban  Rutherford Nail, PharmD PGY2 Critical Care Pharmacy Resident 01/28/2023,10:23 AM

## 2023-01-28 NOTE — Progress Notes (Signed)
ANTICOAGULATION CONSULT NOTE  Pharmacy Consult for heparin infusion Indication: atrial fibrillation  No Known Allergies  Patient Measurements: Height: 6\' 2"  (188 cm) Weight: (!) 140.2 kg (309 lb 1.4 oz) IBW/kg (Calculated) : 82.2 Heparin Dosing Weight: 114 kg  Vital Signs: Temp: 98.9 F (37.2 C) (08/21 2009) Temp Source: Oral (08/21 1703) BP: 114/76 (08/21 2009) Pulse Rate: 79 (08/21 2009)  Labs: Recent Labs    01/27/23 1114 01/27/23 1523 01/27/23 2140 01/28/23 0630 01/28/23 2054  HGB 8.5*  --   --  9.5*  --   HCT 26.8*  --   --  29.9*  --   PLT 214  --   --  227  --   APTT  --   --   --   --  45*  LABPROT  --  19.2*  --   --   --   INR  --  1.6*  --   --   --   HEPARINUNFRC  --   --   --   --  >1.10*  CREATININE 19.73*  --  20.17* 19.76*  --     Estimated Creatinine Clearance: 6.8 mL/min (A) (by C-G formula based on SCr of 19.76 mg/dL (H)).   Medical History: Past Medical History:  Diagnosis Date   Acute respiratory failure with hypoxia (HCC) 11/30/2018   Anemia    ESRD   ESRD on hemodialysis (HCC) 06/07/2011   TTS [facility]. Started HD in 2006, got transplant in 2014 lasted until Dec 2019 then went back on HD.  Has L thigh AVG as of Jun 2020.  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    History of blood transfusion    Hypertension    03/19/22- has not had high blood pressure in 5 years.   Morbid obesity (HCC)    Paroxysmal atrial fibrillation (HCC)    Pre-diabetes    no meds   Sepsis (HCC) 11/30/2018   Sleep apnea    lost weight, does not use CPAP    Medications:  Apixaban 5 mg PO BID prior to admission for atrial fibrillation  Assessment: 47 YOM who presented for septic shock with hypotension, lactic acid 2.2, requiring fluid resuscitation and vasopressors. Source of infection suspected to be tunneled dialysis catheter requiring possible replacement. Initiating heparin infusion in lieu of apixaban for potential line replacement. Last dose  of apixaban 8/19 per patient, will assess both heparin level and aPTT until correlating.   aPTT 45 subtherapeutic. No issues with heparin infusion or signs of bleeding reported.  Goal of Therapy:  Heparin level 0.3-0.7 units/ml aPTT 66 - 102 seconds Monitor platelets by anticoagulation protocol: Yes   Plan:  Increase heparin infusion to 1700 units/hr Check heparin level/aPTT in 8 hours and daily while on heparin Continue to monitor H&H and platelets   Thank you for allowing pharmacy to be a part of this patient's care.  Thelma Barge, PharmD Clinical Pharmacist

## 2023-01-28 NOTE — Progress Notes (Signed)
   NAME:  [name], MRN:  [id number], DOB:  1975/08/25, LOS: 1 ADMISSION DATE:  01/27/2023  History of Present Illness:  47 year old male with history of right lower extremity osteomyelitis status post first ray transmetatarsal amputation, ESRD on HD complicated by poor access and currently using a tunneled left femoral HD cath.  He presented to ED from HD for fevers and hypotension, concerning for septic shock.  Recent history notable for prolonged course of antibiotics for RLE osteomyelitis.  He notes that he has had routine follow-up with podiatry since his surgery.  He notes that he has had some oozing from his left femoral tunneled dialysis catheter of late.  Significant Hospital Events:  01/27/2023 admitted to [facility] for septic shock, Vanco, cefepime, Flagyl started 02/05/2023 off pressors, blood cultures growing Enterobacter and Proteus 1 out of 4 bottles  Interim History / Subjective:  Feeling better since admission to the hospital.  Frustrated because he feels this setback will delay surgery for salvage of right upper extremity HD access.  Objective   Blood pressure 100/77, pulse 83, temperature 97.8 F (36.6 C), temperature source Oral, resp. rate 20, height 6\' 2"  (1.88 m), weight (!) 139.7 kg, SpO2 97%.    Pressure Support:  [2 cmH20] 2 cmH20   Intake/Output Summary (Last 24 hours) at 01/28/2023 0734 Last data filed at 01/28/2023 0400 Gross per 24 hour  Intake 1107.25 ml  Output --  Net 1107.25 ml   Filed Weights   01/27/23 1507 01/28/23 0500  Weight: (!) 141.1 kg (!) 139.7 kg    Examination: Well appearing, no distress Heart rate and rhythm normal, radials strong Breathing regular and unlabored on room air, lungs clear Skin warm and dry, site of L femoral HD cath is warm without induration or fluctuance, draining scant thick bloody fluid Alert and oriented Mood is frustrated and congruent affect  Labs: BCID with proteus and enterbacterales K up to  5.9 Bicarb down to 16 Hgb 9.5 and stable  Resolved Hospital Problem list   Resolved Problems:   * No resolved hospital problems. *   Assessment & Plan:  Septic shock, resolved Proteus and Enterobacter bacteremia, concern for CLABSI - off pressors - narrow abx to cefepime - susceptibilities pending - vascular consulted regarding infected line - ID to see and make recommendations - stable for med-tele  Chronic hypotension - midodrine 15 mg tid  ESRD on HD - appreciate nephrology assistance - HD today  Limited HD access - complex case - dependent on L fem tunneled line for now - anticipating hero graft in LUE in September - appreciate VVS assistance  Right lower extremity osteomyelitis status post first ray amputation - less suspicion for this as infectious source - follow-up with podiatry as recently as 01/26/23 has been routine - x-ray without periosteal changes - MRI R foot, non-urgent  Paroxysmal A-fib - heparin infusion for now, holding home eliquis  Best practice (daily eval):  Diet/type: Regular consistency (see orders) DVT prophylaxis: systemic heparin GI prophylaxis: N/A Lines: Dialysis Catheter tunneled left femoral Foley:  N/A Code Status:  full code  Marrianne Mood MD 01/28/2023, 7:34 AM  Pager: [phone]

## 2023-01-29 ENCOUNTER — Inpatient Hospital Stay (HOSPITAL_COMMUNITY): Payer: Medicare HMO

## 2023-01-29 DIAGNOSIS — R7881 Bacteremia: Secondary | ICD-10-CM | POA: Diagnosis not present

## 2023-01-29 HISTORY — PX: IR FLUORO GUIDE CV LINE LEFT: IMG2282

## 2023-01-29 HISTORY — PX: IR VENO/EXT/UNI LEFT: IMG675

## 2023-01-29 LAB — CBC
HCT: 24.9 % — ABNORMAL LOW (ref 39.0–52.0)
Hemoglobin: 8 g/dL — ABNORMAL LOW (ref 13.0–17.0)
MCH: 27.7 pg (ref 26.0–34.0)
MCHC: 32.1 g/dL (ref 30.0–36.0)
MCV: 86.2 fL (ref 80.0–100.0)
Platelets: 271 10*3/uL (ref 150–400)
RBC: 2.89 MIL/uL — ABNORMAL LOW (ref 4.22–5.81)
RDW: 18.6 % — ABNORMAL HIGH (ref 11.5–15.5)
WBC: 9.4 10*3/uL (ref 4.0–10.5)
nRBC: 0 % (ref 0.0–0.2)

## 2023-01-29 LAB — HEPARIN LEVEL (UNFRACTIONATED): Heparin Unfractionated: 0.8 [IU]/mL — ABNORMAL HIGH (ref 0.30–0.70)

## 2023-01-29 LAB — VANCOMYCIN, RANDOM: Vancomycin Rm: 24 ug/mL

## 2023-01-29 LAB — APTT: aPTT: 44 seconds — ABNORMAL HIGH (ref 24–36)

## 2023-01-29 LAB — HEPATITIS B SURFACE ANTIBODY, QUANTITATIVE: Hep B S AB Quant (Post): 60.9 m[IU]/mL

## 2023-01-29 MED ORDER — CHLORHEXIDINE GLUCONATE CLOTH 2 % EX PADS
6.0000 | MEDICATED_PAD | Freq: Every day | CUTANEOUS | Status: DC
  Administered 2023-01-30 – 2023-01-31 (×2): 6 via TOPICAL

## 2023-01-29 MED ORDER — HEPARIN SODIUM (PORCINE) 1000 UNIT/ML IJ SOLN
INTRAMUSCULAR | Status: AC
  Filled 2023-01-29: qty 10

## 2023-01-29 MED ORDER — DARBEPOETIN ALFA 150 MCG/0.3ML IJ SOSY
150.0000 ug | PREFILLED_SYRINGE | INTRAMUSCULAR | Status: DC
  Administered 2023-01-29: 150 ug via SUBCUTANEOUS
  Filled 2023-01-29: qty 0.3

## 2023-01-29 MED ORDER — METRONIDAZOLE 500 MG PO TABS
500.0000 mg | ORAL_TABLET | Freq: Two times a day (BID) | ORAL | Status: DC
  Administered 2023-01-29 – 2023-02-05 (×12): 500 mg via ORAL
  Filled 2023-01-29 (×11): qty 1

## 2023-01-29 MED ORDER — LIDOCAINE-EPINEPHRINE 1 %-1:100000 IJ SOLN
INTRAMUSCULAR | Status: AC
  Filled 2023-01-29: qty 1

## 2023-01-29 MED ORDER — FERRIC CITRATE 1 GM 210 MG(FE) PO TABS
630.0000 mg | ORAL_TABLET | Freq: Three times a day (TID) | ORAL | Status: DC
  Administered 2023-01-29 – 2023-02-05 (×12): 630 mg via ORAL
  Filled 2023-01-29 (×13): qty 3

## 2023-01-29 MED ORDER — IOHEXOL 350 MG/ML SOLN
75.0000 mL | Freq: Once | INTRAVENOUS | Status: AC | PRN
  Administered 2023-01-29: 75 mL via INTRAVENOUS

## 2023-01-29 MED ORDER — HEPARIN (PORCINE) 25000 UT/250ML-% IV SOLN
2600.0000 [IU]/h | INTRAVENOUS | Status: DC
  Administered 2023-01-29: 1850 [IU]/h via INTRAVENOUS
  Administered 2023-01-30: 2300 [IU]/h via INTRAVENOUS
  Administered 2023-01-30: 2100 [IU]/h via INTRAVENOUS
  Administered 2023-01-31 – 2023-02-02 (×6): 2600 [IU]/h via INTRAVENOUS
  Filled 2023-01-29 (×8): qty 250

## 2023-01-29 MED ORDER — LIDOCAINE-EPINEPHRINE 1 %-1:100000 IJ SOLN
20.0000 mL | Freq: Once | INTRAMUSCULAR | Status: AC
  Administered 2023-01-29: 20 mL via INTRADERMAL

## 2023-01-29 MED ORDER — VANCOMYCIN VARIABLE DOSE PER UNSTABLE RENAL FUNCTION (PHARMACIST DOSING): Status: DC

## 2023-01-29 MED ORDER — SODIUM CHLORIDE 0.9 % IV SOLN
2.0000 g | INTRAVENOUS | Status: DC
  Administered 2023-01-29: 2 g via INTRAVENOUS
  Filled 2023-01-29: qty 20

## 2023-01-29 MED ORDER — IOHEXOL 300 MG/ML  SOLN
50.0000 mL | Freq: Once | INTRAMUSCULAR | Status: AC | PRN
  Administered 2023-01-29: 25 mL via INTRAVENOUS

## 2023-01-29 MED ORDER — PROSOURCE PLUS PO LIQD
30.0000 mL | Freq: Two times a day (BID) | ORAL | Status: DC
  Administered 2023-01-30 – 2023-02-05 (×7): 30 mL via ORAL
  Filled 2023-01-29 (×8): qty 30

## 2023-01-29 NOTE — Progress Notes (Signed)
[facility] Progress Note   Subjective:  Finally back in room after multiple scans and TDC replacement. Feels ok, no dyspnea or CP. Per IR note, there is high likelihood that this catheter will not function great - underwent abdominal CT as eval for possible transhepatic TDC. Plan is for dialysis tomorrow.  Objective Vitals:   01/28/23 2009 01/28/23 2100 01/29/23 0039 01/29/23 0331  BP: 114/76  (!) 87/62 (!) 95/57  Pulse: 79  (!) 105 93  Resp: 18  16 16   Temp: 98.9 F (37.2 C)  100 F (37.8 C) 97.6 F (36.4 C)  TempSrc: Oral  Oral Oral  SpO2: 100%  99% 98%  Weight:  (!) 141.6 kg    Height:       Physical Exam General: Well appearing, overweight man, NAD. Room air Heart: RRR; no murmur Lungs: CTA anteriorly Abdomen: soft Extremities: no LE edema Dialysis Access: L femoral TDC  Additional Objective Labs: Basic Metabolic Panel: Recent Labs  Lab 01/27/23 1114 01/27/23 2140 01/28/23 0630  NA 132* 132* 130*  K 5.6* 5.5* 5.9*  CL 95* 95* 94*  CO2 19* 21* 16*  GLUCOSE 95 106* 103*  BUN 92* 92* 99*  CREATININE 19.73* 20.17* 19.76*  CALCIUM 8.4* 8.4* 8.6*   Liver Function Tests: Recent Labs  Lab 01/27/23 1114 01/28/23 0630  AST 50* 34  ALT 53* 46*  ALKPHOS 149* 120  BILITOT 0.8 1.2  PROT 7.5 7.2  ALBUMIN 2.9* 2.9*   CBC: Recent Labs  Lab 01/27/23 1114 01/28/23 0630 01/29/23 0833  WBC 5.6 9.2 9.4  NEUTROABS 4.4  --   --   HGB 8.5* 9.5* 8.0*  HCT 26.8* 29.9* 24.9*  MCV 86.7 88.7 86.2  PLT 214 227 271   Blood Culture    Component Value Date/Time   SDES BLOOD RIGHT HAND 01/27/2023 1150   SPECREQUEST  01/27/2023 1150    BOTTLES DRAWN AEROBIC AND ANAEROBIC Blood Culture adequate volume   CULT  01/27/2023 1150    NO GROWTH 2 DAYS Performed at [facility], [address], [place], [place] [address]    REPTSTATUS PENDING 01/27/2023 1150   Studies/Results: MR FOOT RIGHT WO CONTRAST  Result Date: 01/29/2023 CLINICAL DATA:  Right  foot infection. Great toe amputation on 12/12/2022 EXAM: MRI OF THE RIGHT FOREFOOT WITHOUT CONTRAST TECHNIQUE: Multiplanar, multisequence MR imaging of the right forefoot was performed. No intravenous contrast was administered. COMPARISON:  X-ray 01/27/2023 FINDINGS: Bones/Joint/Cartilage Postsurgical changes from transmetatarsal amputation of the first ray. There is bone marrow edema within the distal 7 mm of the residual first metatarsal diaphysis at the resection margin. No confluent low T1 signal changes at this location. The remaining bony structures of the forefoot are otherwise within normal limits. No fracture or dislocation. No additional sites of focal bone marrow edema or marrow replacement. No joint effusions. Ligaments Intact Lisfranc ligament.  Intact collateral ligaments. Muscles and Tendons Diffuse edema-like signal of the foot musculature which may represent changes related to denervation and/or myositis. No tenosynovitis. Soft tissues Diffuse soft tissue swelling. Irregularity within the skin and superficial soft tissues at the medial aspect of the forefoot, likely along the surgical incision site. There is fluid and edema which tracks to the resection margin of the first metatarsal (series 4, image 29). There are no organized or drainable fluid collections. IMPRESSION: 1. 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 There is bone marrow edema within the distal aspect of the residual first metatarsal diaphysis  at the resection margin. Appearance is most suggestive of early acute osteomyelitis given the appearance of the overlying soft tissues. 2. Fluid and edema tracks to the resection margin of the first metatarsal. 3. No organized or drainable fluid collections. 4. Diffuse edema-like signal of the foot musculature which may represent changes related to denervation and/or myositis. Electronically Signed   By: Duanne Guess D.O.   On: 01/29/2023 14:39     Medications:  sodium chloride Stopped (01/28/23 0457)   ceFEPime (MAXIPIME) IV 1 g (01/29/23 0503)   heparin      Chlorhexidine Gluconate Cloth  6 each Topical Daily   metroNIDAZOLE  500 mg Oral Q12H   midodrine  15 mg Oral TID WC    Dialysis Orders: TTS at [facility] 4.5hr, 500/500, EDW 137kg, 2K/2Ca bath, TDC L femoral, heparin 5000 initial + 2500 mid run bolus - Parsabiv 7.5mg  IV q HD - Mircera IV q 2 weeks - last 8/6  Assessment/Plan: Proteus bacteremia: Blood Cx 8/20 (2 of 4 positive?), repeat 8/22 pending. On Cefepime. Source either [facility] or R foot wound. Old TDC removed, new one replaced 8/22 with IR. Chronic R foot wound: S/p R toe amputation 7/5, not healing. Possible early osteo on imaging - Vanc, Flagyl being added per ID. ESRD: TTS schedule as outpatient, but had short-HD on Tues, so dialyzed again here 8/21 in preparation for possible line holiday. Technically due on Saturday - will look at labs and volume tomorrow - probably dialyze tomorrow as it would be helpful to know if [facility] will run prior to the weekend when we may end up stuck if it doesn't work. Dialysis access: Running out of options unfortunately, has been on HD ~20 years. Known central venous disease/SVC stenosis, has been followed by [facility] will plan for HeRO graft in the near future - of course that will be on hold until infection cleared. IR is evaluating him for possible transhepatic catheter.  New L femoral TDC 8/22. Hypotension/volume: Chronic low BP on midodrine 15mg  TID, up per scale but seems euvolemic on exam. Anemia of ESRD: Hgb 8 - due for ESA, will order. Secondary HTPH: CorrCa ok, Phos pending. Restart home binders. Parsabiv not formulary here. Nutrition: Alb low, adding supps. pAF: Eliquis on hold for now OSA Adjustment disorder: Mood is very low today, very worried about the future.   Robert Hoyle, PA-C 01/29/2023, 3:34 PM  [facility]

## 2023-01-29 NOTE — Procedures (Signed)
Pre-procedure Diagnosis: ESRD, bacteremia, poorly functioning HD catheter.  Post-procedure Diagnosis: Same  Pre-existing HD catheter would not aspirate. Pull back injection of pre-existing HD catheter demonstrates complete, chronic occlusion of the IVC and pelvic venous system.  Attempted fluoroscopic guided exchange of left femoral approach HD catheter with tip now terminating within the left hemipelvis. The catheter is ready for attempted usage, however I am doubtful this HD catheter will serve as a durable source for successful HD and agree with plan to proceed with evaluation for attempted transhepatic HD catheter.   Complications: None Immediate EBL: Trace  Katherina Right, MD Pager #: [phone]

## 2023-01-29 NOTE — Progress Notes (Addendum)
PROGRESS NOTE  [name]  JXB:[id number] DOB: February 27, 1976 DOA: 01/27/2023 PCP: [name], MD   Brief Narrative: Patient is a 47 year old male with history of ESRD, failed renal transplant, right lower extremity osteomyelitis status post first ray transmetatarsal amputation who was brought from outpatient dialysis center for further evaluation of hypotension.  He was recently treated with prolonged course of antibiotics for right lower extremity osteomyelitis .patient was also having thick drainage from his left femoral tunneled catheter for 3 days.  Patient was suspected to have septic shock and admitted under PCCM service, started on pressors.  Blood cultures showing gram-negative rods.  ID, nephrology, vascular surgery following.  Patient transferred to [facility] service on 8/22  Assessment & Plan:  Active Problems:   Paroxysmal atrial fibrillation (HCC)   Septic shock (HCC)   Chronic anticoagulation   Gram-negative bacteremia   ESRD on hemodialysis (HCC)   Septic shock/gram-negative bacteremia/HD line infection: Presented with hypotension.  Patient was also having thick drainage from his left femoral tunneled catheter for 3 days.  Patient was suspected to have septic shock and admitted under PCCM service, started on pressors.  Blood cultures showing gram-negative rods: Enterobacter and Proteus.  Currently on ceftriaxone, Vanco, Flagyl.  Continue follow-up final culture report. Sepsis physiology has resolved, lactic acidosis has resolved .currently blood pressure stable.  Patient has chronic hypotension and is on midodrine.  ESRD: Nephrology following.  Concern for HD line infection.  Patient has access in the left femoral area.  Complicated vascular access situation.  Vascular surgery following,he was  planned for graft placement in few weeks.  Currently he has no options for tunneled dialysis catheter placement.  Vascular surgery discussed with IR.Underwent  fluoroscopy guided exchange  of left femoral hemodialysis catheter by IR on 8/22.now the plan is for transhepatic hemodialysis catheter placement as current catheter HD may not be durable.We will continue phosphate binders, has mild hyperkalemia.  Recent right lower extremity osteomyelitis: Status post first ray amputation.  MRI recommended by ID,now shows changes of osteomyelitis.  X-ray without periosteal changes.  Continue current antibiotics.ID following  Hyponatremia: Likely from volume overload.  Volume management as per dialysis  Anemia of chronic disease: Continue to monitor hemoglobin.  Transfuse if hemoglobin drops less than 7  Paroxysmal A-fib: Currently in normal sinus rhythm.  Monitor on telemetry.  Currently Eliquis is on hold.  On heparin drip for possible upcoming procedures  Hyperglycemia: A1c of 5.9 as per 6/24.  Monitor blood sugars  Morbid obesity: BMI 40       DVT prophylaxis:iv heparin     Code Status: Full Code  Family Communication: None at bedside  Patient status:Inpatient  Patient is from :home  Anticipated discharge ZH:YQMV  Estimated DC date:not sure   Consultants: ID, PCCM, nephrology, vascular surgery  Procedures: Dialysis, exchange of dialysis catheter  Antimicrobials:  Anti-infectives (From admission, onward)    Start     Dose/Rate Route Frequency Ordered Stop   01/29/23 0600  ceFEPIme (MAXIPIME) 1 g in sodium chloride 0.9 % 100 mL IVPB        1 g 200 mL/hr over 30 Minutes Intravenous Every 24 hours 01/28/23 1006     01/28/23 1600  ceFEPIme (MAXIPIME) 1 g in sodium chloride 0.9 % 100 mL IVPB  Status:  Discontinued        1 g 200 mL/hr over 30 Minutes Intravenous Every 24 hours 01/27/23 1715 01/27/23 1759   01/28/23 0400  ceFEPIme (MAXIPIME) 2 g in sodium chloride 0.9 %  100 mL IVPB  Status:  Discontinued        2 g 200 mL/hr over 30 Minutes Intravenous Every 12 hours 01/27/23 1759 01/28/23 1006   01/27/23 1811  vancomycin variable dose per unstable renal function  (pharmacist dosing)  Status:  Discontinued         Does not apply See admin instructions 01/27/23 1811 01/28/23 0959   01/27/23 1445  metroNIDAZOLE (FLAGYL) IVPB 500 mg  Status:  Discontinued        500 mg 100 mL/hr over 60 Minutes Intravenous Every 12 hours 01/27/23 1434 01/28/23 1010   01/27/23 1445  ceFEPIme (MAXIPIME) 1 g in sodium chloride 0.9 % 100 mL IVPB        1 g 200 mL/hr over 30 Minutes Intravenous  Once 01/27/23 1440 01/27/23 1618   01/27/23 1115  cefTRIAXone (ROCEPHIN) 2 g in sodium chloride 0.9 % 100 mL IVPB        2 g 200 mL/hr over 30 Minutes Intravenous  Once 01/27/23 1101 01/27/23 1212   01/27/23 1115  vancomycin (VANCOCIN) 2,500 mg in sodium chloride 0.9 % 500 mL IVPB        2,500 mg 262.5 mL/hr over 120 Minutes Intravenous  Once 01/27/23 1108 01/27/23 1534       Subjective: Patient seen and examined at bedside this afternoon.  He looked comfortable.  Hemodynamically stable.  Aebrile.  He just came from IR, status post fluoroscopic guided exchange of left femoral HD catheter.    Objective: Vitals:   01/28/23 2009 01/28/23 2100 01/29/23 0039 01/29/23 0331  BP: 114/76  (!) 87/62 (!) 95/57  Pulse: 79  (!) 105 93  Resp: 18  16 16   Temp: 98.9 F (37.2 C)  100 F (37.8 C) 97.6 F (36.4 C)  TempSrc: Oral  Oral Oral  SpO2: 100%  99% 98%  Weight:  (!) 141.6 kg    Height:        Intake/Output Summary (Last 24 hours) at 01/29/2023 0751 Last data filed at 01/29/2023 0331 Gross per 24 hour  Intake 996.06 ml  Output 2000 ml  Net -1003.94 ml   Filed Weights   01/28/23 1245 01/28/23 1734 01/28/23 2100  Weight: (!) 142.3 kg (!) 140.2 kg (!) 141.6 kg    Examination:  General exam: Overall comfortable, not in distress, morbidly obese HEENT: PERRL Respiratory system:  no wheezes or crackles  Cardiovascular system: S1 & S2 heard, RRR.  Gastrointestinal system: Abdomen is nondistended, soft and nontender. Central nervous system: Alert and oriented Extremities: No  edema, no clubbing ,no cyanosis, femoral catheter on the left groin,first ray  amputation of right foot Skin: No rashes,no icterus     Data Reviewed: I have personally reviewed following labs and imaging studies  CBC: Recent Labs  Lab 01/27/23 1114 01/28/23 0630  WBC 5.6 9.2  NEUTROABS 4.4  --   HGB 8.5* 9.5*  HCT 26.8* 29.9*  MCV 86.7 88.7  PLT 214 227   Basic Metabolic Panel: Recent Labs  Lab 01/27/23 1114 01/27/23 2140 01/28/23 0630  NA 132* 132* 130*  K 5.6* 5.5* 5.9*  CL 95* 95* 94*  CO2 19* 21* 16*  GLUCOSE 95 106* 103*  BUN 92* 92* 99*  CREATININE 19.73* 20.17* 19.76*  CALCIUM 8.4* 8.4* 8.6*  MG  --   --  2.1     Recent Results (from the past 240 hour(s))  Culture, blood (Routine x 2)     Status: None (Preliminary result)  Collection Time: 01/27/23 11:00 AM   Specimen: BLOOD  Result Value Ref Range Status   Specimen Description BLOOD RIGHT ANTECUBITAL  Final   Special Requests   Final    BOTTLES DRAWN AEROBIC AND ANAEROBIC Blood Culture adequate volume   Culture  Setup Time   Final    GRAM NEGATIVE RODS IN BOTH AEROBIC AND ANAEROBIC BOTTLES CRITICAL RESULT CALLED TO, READ BACK BY AND VERIFIED WITH: V BRYK,[id number]  01/28/23 MK Performed at [facility], [address], [place], [place] [address]    Culture GRAM NEGATIVE RODS  Final   Report Status PENDING  Incomplete  Blood Culture ID Panel (Reflexed)     Status: Abnormal   Collection Time: 01/27/23 11:00 AM  Result Value Ref Range Status   Enterococcus faecalis NOT DETECTED NOT DETECTED Final   Enterococcus Faecium NOT DETECTED NOT DETECTED Final   Listeria monocytogenes NOT DETECTED NOT DETECTED Final   Staphylococcus species NOT DETECTED NOT DETECTED Final   Staphylococcus aureus (BCID) NOT DETECTED NOT DETECTED Final   Staphylococcus epidermidis NOT DETECTED NOT DETECTED Final   Staphylococcus lugdunensis NOT DETECTED NOT DETECTED Final   Streptococcus species NOT DETECTED NOT DETECTED  Final   Streptococcus agalactiae NOT DETECTED NOT DETECTED Final   Streptococcus pneumoniae NOT DETECTED NOT DETECTED Final   Streptococcus pyogenes NOT DETECTED NOT DETECTED Final   A.calcoaceticus-baumannii NOT DETECTED NOT DETECTED Final   Bacteroides fragilis NOT DETECTED NOT DETECTED Final   Enterobacterales DETECTED (A) NOT DETECTED Final    Comment: Enterobacterales represent a large order of gram negative bacteria, not a single organism. CRITICAL RESULT CALLED TO, READ BACK BY AND VERIFIED WITH: V BRYK,[id number]  01/28/23 MK    Enterobacter cloacae complex NOT DETECTED NOT DETECTED Final   Escherichia coli NOT DETECTED NOT DETECTED Final   Klebsiella aerogenes NOT DETECTED NOT DETECTED Final   Klebsiella oxytoca NOT DETECTED NOT DETECTED Final   Klebsiella pneumoniae NOT DETECTED NOT DETECTED Final   Proteus species DETECTED (A) NOT DETECTED Final    Comment: CRITICAL RESULT CALLED TO, READ BACK BY AND VERIFIED WITH: V BRYK,[id number]  01/28/23 MK    Salmonella species NOT DETECTED NOT DETECTED Final   Serratia marcescens NOT DETECTED NOT DETECTED Final   Haemophilus influenzae NOT DETECTED NOT DETECTED Final   Neisseria meningitidis NOT DETECTED NOT DETECTED Final   Pseudomonas aeruginosa NOT DETECTED NOT DETECTED Final   Stenotrophomonas maltophilia NOT DETECTED NOT DETECTED Final   Candida albicans NOT DETECTED NOT DETECTED Final   Candida auris NOT DETECTED NOT DETECTED Final   Candida glabrata NOT DETECTED NOT DETECTED Final   Candida krusei NOT DETECTED NOT DETECTED Final   Candida parapsilosis NOT DETECTED NOT DETECTED Final   Candida tropicalis NOT DETECTED NOT DETECTED Final   Cryptococcus neoformans/gattii NOT DETECTED NOT DETECTED Final   CTX-M ESBL NOT DETECTED NOT DETECTED Final   Carbapenem resistance IMP NOT DETECTED NOT DETECTED Final   Carbapenem resistance KPC NOT DETECTED NOT DETECTED Final   Carbapenem resistance NDM NOT DETECTED NOT DETECTED Final    Carbapenem resist OXA 48 LIKE NOT DETECTED NOT DETECTED Final   Carbapenem resistance VIM NOT DETECTED NOT DETECTED Final    Comment: Performed at [facility], [address], [place], [place] [address]  SARS Coronavirus 2 by RT PCR (hospital order, performed in [facility] hospital lab) *cepheid single result test* Anterior Nasal Swab     Status: None   Collection Time: 01/27/23 11:42 AM   Specimen: Anterior Nasal  Swab  Result Value Ref Range Status   SARS Coronavirus 2 by RT PCR NEGATIVE NEGATIVE Final    Comment: Performed at [facility], [address], [place], [place] [address]  Culture, blood (Routine x 2)     Status: None (Preliminary result)   Collection Time: 01/27/23 11:50 AM   Specimen: BLOOD RIGHT HAND  Result Value Ref Range Status   Specimen Description BLOOD RIGHT HAND  Final   Special Requests   Final    BOTTLES DRAWN AEROBIC AND ANAEROBIC Blood Culture adequate volume   Culture   Final    NO GROWTH 2 DAYS Performed at [facility], [address], [place], [place] [address]    Report Status PENDING  Incomplete  MRSA Next Gen by PCR, Nasal     Status: None   Collection Time: 01/27/23  3:09 PM   Specimen: Nasal Mucosa; Nasal Swab  Result Value Ref Range Status   MRSA by PCR Next Gen NOT DETECTED NOT DETECTED Final    Comment: (NOTE) The GeneXpert MRSA Assay (FDA approved for NASAL specimens only), is one component of a comprehensive MRSA colonization surveillance program. It is not intended to diagnose MRSA infection nor to guide or monitor treatment for MRSA infections. Test performance is not FDA approved in patients less than 70 years old. Performed at [facility], [address], [place], [place] [address]      Radiology Studies: DG Foot Complete Right  Result Date: 01/27/2023 CLINICAL DATA:  Osteomyelitis. Status post amputation. Possible sepsis. EXAM: RIGHT FOOT COMPLETE - 3+ VIEW COMPARISON:  Right foot radiographs 12/12/2022  and 12/08/2022 FINDINGS: Redemonstration of amputation of the great toe to the mid metatarsal shaft. Within the limitations of recent surgery, no definite new cortical erosion is seen at the distal amputation site. There is soft tissue swelling at the amputation site. Interval decrease in air at the distal amputation site compared to immediate postoperative radiographs 12/12/2022. Moderate plantar calcaneal heel spur. No acute fracture or dislocation. IMPRESSION: Redemonstration of amputation of the great toe to the mid metatarsal shaft. Within the limitations of recent surgery, no definite new cortical erosion is seen at the distal amputation site. Electronically Signed   By: Neita Garnet M.D.   On: 01/27/2023 14:51   DG Chest Portable 1 View  Result Date: 01/27/2023 CLINICAL DATA:  Sepsis.  Toe amputation.  Possible sepsis. EXAM: PORTABLE CHEST 1 VIEW COMPARISON:  Chest radiographs 02/15/2021, 12/06/2022, 12/07/2022 FINDINGS: Moderately decreased lung volumes. Cardiac silhouette and mediastinal contours are within normal limits for technique and low lung volumes. No focal airspace opacity is seen. No pulmonary edema, pleural effusion, or pneumothorax. No acute skeletal abnormality. IMPRESSION: Low lung volumes. No pneumonia is identified. Electronically Signed   By: Neita Garnet M.D.   On: 01/27/2023 14:46    Scheduled Meds:  Chlorhexidine Gluconate Cloth  6 each Topical Daily   midodrine  15 mg Oral TID WC   Continuous Infusions:  sodium chloride Stopped (01/28/23 0457)   ceFEPime (MAXIPIME) IV 1 g (01/29/23 0503)   heparin 1,700 Units/hr (01/28/23 2336)     LOS: 2 days   Burnadette Pop, MD [facility] P8/22/2024, 7:51 AM

## 2023-01-29 NOTE — Progress Notes (Signed)
MEWS Progress Note  Patient Details Name: [name] MRN: [id number] DOB: [birth year] Today's Date: 01/29/2023   MEWS Flowsheet Documentation:  Assess: MEWS Score Temp: 97.6 F (36.4 C) BP: (!) 95/57 MAP (mmHg): 68 Pulse Rate: 93 ECG Heart Rate: 77 Resp: 16 Level of Consciousness: Alert SpO2: 98 % O2 Device: Room Air O2 Flow Rate (L/min): 2 L/min Assess: MEWS Score MEWS Temp: 0 MEWS Systolic: 1 MEWS Pulse: 0 MEWS RR: 0 MEWS LOC: 0 MEWS Score: 1 MEWS Score Color: Green Assess: SIRS CRITERIA SIRS Temperature : 0 SIRS Respirations : 0 SIRS Pulse: 1 SIRS WBC: 0 SIRS Score Sum : 1 SIRS Temperature : 0 SIRS Pulse: 0 SIRS Respirations : 0 SIRS WBC: 0 SIRS Score Sum : 0 Assess: if the MEWS score is Yellow or Red Were vital signs accurate and taken at a resting state?: Yes Does the patient meet 2 or more of the SIRS criteria?: No MEWS guidelines implemented : Yes, yellow Treat MEWS Interventions: Considered administering scheduled or prn medications/treatments as ordered Take Vital Signs Increase Vital Sign Frequency : Yellow: Q2hr x1, continue Q4hrs until patient remains green for 12hrs Escalate MEWS: Escalate: Yellow: Discuss with charge nurse and consider notifying provider and/or RRT        [name] 01/29/2023, 4:43 AM

## 2023-01-29 NOTE — Progress Notes (Addendum)
ANTICOAGULATION CONSULT NOTE  Pharmacy Consult for Eliquis PTA >> heparin infusion Indication: atrial fibrillation  No Known Allergies  Patient Measurements: Height: 6\' 2"  (188 cm) Weight: (!) 141.6 kg (312 lb 2.7 oz) IBW/kg (Calculated) : 82.2 Heparin Dosing Weight: 114 kg  Vital Signs: Temp: 97.6 F (36.4 C) (08/22 0331) Temp Source: Oral (08/22 0331) BP: 95/57 (08/22 0331) Pulse Rate: 93 (08/22 0331)  Labs: Recent Labs    01/27/23 1114 01/27/23 1523 01/27/23 2140 01/28/23 0630 01/28/23 2054 01/29/23 0833  HGB 8.5*  --   --  9.5*  --  8.0*  HCT 26.8*  --   --  29.9*  --  24.9*  PLT 214  --   --  227  --  271  APTT  --   --   --   --  45*  --   LABPROT  --  19.2*  --   --   --   --   INR  --  1.6*  --   --   --   --   HEPARINUNFRC  --   --   --   --  >1.10* 0.80*  CREATININE 19.73*  --  20.17* 19.76*  --   --     Estimated Creatinine Clearance: 6.9 mL/min (A) (by C-G formula based on SCr of 19.76 mg/dL (H)).   Medical History: Past Medical History:  Diagnosis Date   Acute respiratory failure with hypoxia (HCC) 11/30/2018   Anemia    ESRD   ESRD on hemodialysis (HCC) 06/07/2011   TTS [facility]. Started HD in 2006, got transplant in 2014 lasted until Dec 2019 then went back on HD.  Has L thigh AVG as of Jun 2020.  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    History of blood transfusion    Hypertension    03/19/22- has not had high blood pressure in 5 years.   Morbid obesity (HCC)    Paroxysmal atrial fibrillation (HCC)    Pre-diabetes    no meds   Sepsis (HCC) 11/30/2018   Sleep apnea    lost weight, does not use CPAP    Medications:  Apixaban 5 mg PO BID prior to admission for atrial fibrillation  Assessment: 47 YOM who presented for septic shock with hypotension, lactic acid 2.2, requiring fluid resuscitation and vasopressors. Source of infection suspected to be tunneled dialysis catheter requiring possible replacement. Initiating heparin  infusion in lieu of apixaban for potential line replacement. Last dose of apixaban 8/19 per patient, will assess both heparin level and aPTT until correlating.   Heparin level supratherapeutic at 0.80 (still falsely elevated due to recent apixaban), aPTT subtherapeutic at 44 seconds after rate increase yesterday evening. Levels are not yet correlating so will continue to dose IV heparin off aPTT's. No issues with heparin infusing and no bleeding noted per d/w RN.   Goal of Therapy:  Heparin level 0.3-0.7 units/ml aPTT 66 - 102 seconds Monitor platelets by anticoagulation protocol: Yes   Plan:  Increase heparin drip to 1850 units/hr Check aPTT in 8 hours Check daily heparin level, aPTT, and CBC   Rexford Maus, PharmD, BCPS 01/29/2023 9:22 AM  PM UPDATE: Patient underwent HD cath exchange by IR this PM. D/w radiology and hold heparin drip for 6 hours post procedure  Resume heparin drip at 1850 units/hr this PM at 2000 Check aPTT/heparin level 8 hours after resumption D/w RN  Rexford Maus, PharmD, BCPS 01/29/2023 1:41 PM

## 2023-01-29 NOTE — Progress Notes (Signed)
Patient refusing labs, education provided. Patient is cursing and being verbally aggressive towards staff. MD and day shift RN notified.

## 2023-01-29 NOTE — Progress Notes (Signed)
[facility]  Date of Admission:  01/27/2023     Total days of antibiotics 3         ASSESSMENT:  [name] sensitivities remain pending and source of infection is unclear with concern for left femoral line or possibly the right foot. Imaging currently pending. Blood cultures drawn on 01/29/23 are pending. Narrow antibiotics to cefepime pending sensitivities. Monitor cultures for clearance of bacteremia and continue investigation into source. Plan of care discussed with [name] and family with questions answered regarding potential sources of infection. Remaining medical and supportive care per Internal Medicine and Nephrology.   PLAN:  Narrow antibiotics to cefepime.  Await imaging results for potential identification of source of infection. Remaining medical and supportive care per Internal Medicine and Nephrology.   I have personally spent 28 minutes involved in face-to-face and non-face-to-face activities for this patient on the day of the visit. Professional time spent includes the following activities: Preparing to see the patient (review of tests), Obtaining and/or reviewing separately obtained history (admission/discharge record), Performing a medically appropriate examination and/or evaluation , Ordering medications/tests/procedures, referring and communicating with other health care professionals, Documenting clinical information in the EMR, Independently interpreting results (not separately reported), Communicating results to the patient/family/caregiver, Counseling and educating the patient/family/caregiver and Care coordination (not separately reported).    Active Problems:   Paroxysmal atrial fibrillation (HCC)   Septic shock (HCC)   Chronic anticoagulation   Gram-negative bacteremia   ESRD on hemodialysis (HCC)    Chlorhexidine Gluconate Cloth  6 each Topical Daily   midodrine  15 mg Oral TID WC    SUBJECTIVE:  Elevated  temperature of 100 F overnight. No new concerns/complaints. Family visiting with several questions.   No Known Allergies   Review of Systems: Review of Systems  Constitutional:  Negative for chills, fever and weight loss.  Respiratory:  Negative for cough, shortness of breath and wheezing.   Cardiovascular:  Negative for chest pain and leg swelling.  Gastrointestinal:  Negative for abdominal pain, constipation, diarrhea, nausea and vomiting.  Skin:  Negative for rash.      OBJECTIVE: Vitals:   01/28/23 2009 01/28/23 2100 01/29/23 0039 01/29/23 0331  BP: 114/76  (!) 87/62 (!) 95/57  Pulse: 79  (!) 105 93  Resp: 18  16 16   Temp: 98.9 F (37.2 C)  100 F (37.8 C) 97.6 F (36.4 C)  TempSrc: Oral  Oral Oral  SpO2: 100%  99% 98%  Weight:  (!) 141.6 kg    Height:       Body mass index is 40.08 kg/m.  Physical Exam Constitutional:      General: He is not in acute distress.    Appearance: He is well-developed.  Cardiovascular:     Rate and Rhythm: Normal rate and regular rhythm.     Heart sounds: Normal heart sounds.  Pulmonary:     Effort: Pulmonary effort is normal.     Breath sounds: Normal breath sounds.  Skin:    General: Skin is warm and dry.  Neurological:     Mental Status: He is alert and oriented to person, place, and time.  Psychiatric:        Mood and Affect: Mood normal.     Lab Results Lab Results  Component Value Date   WBC 9.4 01/29/2023   HGB 8.0 (L) 01/29/2023   HCT 24.9 (L) 01/29/2023   MCV 86.2 01/29/2023   PLT 271 01/29/2023  Lab Results  Component Value Date   CREATININE 19.76 (H) 01/28/2023   BUN 99 (H) 01/28/2023   NA 130 (L) 01/28/2023   K 5.9 (H) 01/28/2023   CL 94 (L) 01/28/2023   CO2 16 (L) 01/28/2023    Lab Results  Component Value Date   ALT 46 (H) 01/28/2023   AST 34 01/28/2023   ALKPHOS 120 01/28/2023   BILITOT 1.2 01/28/2023     Microbiology: Recent Results (from the past 240 hour(s))  Culture, blood (Routine x  2)     Status: Abnormal (Preliminary result)   Collection Time: 01/27/23 11:00 AM   Specimen: BLOOD  Result Value Ref Range Status   Specimen Description BLOOD RIGHT ANTECUBITAL  Final   Special Requests   Final    BOTTLES DRAWN AEROBIC AND ANAEROBIC Blood Culture adequate volume   Culture  Setup Time   Final    GRAM NEGATIVE RODS IN BOTH AEROBIC AND ANAEROBIC BOTTLES CRITICAL RESULT CALLED TO, READ BACK BY AND VERIFIED WITH: V BRYK,PHARMD@[id number]  01/28/23 MK    Culture (A)  Final    [name] MIRABILIS SUSCEPTIBILITIES TO FOLLOW Performed at [facility], [address], [place], [place] [address]    Report Status PENDING  Incomplete  Blood Culture ID Panel (Reflexed)     Status: Abnormal   Collection Time: 01/27/23 11:00 AM  Result Value Ref Range Status   Enterococcus faecalis NOT DETECTED NOT DETECTED Final   Enterococcus Faecium NOT DETECTED NOT DETECTED Final   Listeria monocytogenes NOT DETECTED NOT DETECTED Final   Staphylococcus species NOT DETECTED NOT DETECTED Final   Staphylococcus aureus (BCID) NOT DETECTED NOT DETECTED Final   Staphylococcus epidermidis NOT DETECTED NOT DETECTED Final   Staphylococcus lugdunensis NOT DETECTED NOT DETECTED Final   Streptococcus species NOT DETECTED NOT DETECTED Final   Streptococcus agalactiae NOT DETECTED NOT DETECTED Final   Streptococcus pneumoniae NOT DETECTED NOT DETECTED Final   Streptococcus pyogenes NOT DETECTED NOT DETECTED Final   A.calcoaceticus-baumannii NOT DETECTED NOT DETECTED Final   Bacteroides fragilis NOT DETECTED NOT DETECTED Final   Enterobacterales DETECTED (A) NOT DETECTED Final    Comment: Enterobacterales represent a large order of gram negative bacteria, not a single organism. CRITICAL RESULT CALLED TO, READ BACK BY AND VERIFIED WITH: V BRYK,PHARMD@[id number]  01/28/23 MK    Enterobacter cloacae complex NOT DETECTED NOT DETECTED Final   Escherichia coli NOT DETECTED NOT DETECTED Final   Klebsiella aerogenes  NOT DETECTED NOT DETECTED Final   Klebsiella oxytoca NOT DETECTED NOT DETECTED Final   Klebsiella pneumoniae NOT DETECTED NOT DETECTED Final   [name] species DETECTED (A) NOT DETECTED Final    Comment: CRITICAL RESULT CALLED TO, READ BACK BY AND VERIFIED WITH: V BRYK,PHARMD@[id number]  01/28/23 MK    Salmonella species NOT DETECTED NOT DETECTED Final   Serratia marcescens NOT DETECTED NOT DETECTED Final   Haemophilus influenzae NOT DETECTED NOT DETECTED Final   Neisseria meningitidis NOT DETECTED NOT DETECTED Final   Pseudomonas aeruginosa NOT DETECTED NOT DETECTED Final   Stenotrophomonas maltophilia NOT DETECTED NOT DETECTED Final   Candida albicans NOT DETECTED NOT DETECTED Final   Candida auris NOT DETECTED NOT DETECTED Final   Candida glabrata NOT DETECTED NOT DETECTED Final   Candida krusei NOT DETECTED NOT DETECTED Final   Candida parapsilosis NOT DETECTED NOT DETECTED Final   Candida tropicalis NOT DETECTED NOT DETECTED Final   Cryptococcus neoformans/gattii NOT DETECTED NOT DETECTED Final   CTX-M ESBL NOT DETECTED NOT DETECTED Final  Carbapenem resistance IMP NOT DETECTED NOT DETECTED Final   Carbapenem resistance KPC NOT DETECTED NOT DETECTED Final   Carbapenem resistance NDM NOT DETECTED NOT DETECTED Final   Carbapenem resist OXA 48 LIKE NOT DETECTED NOT DETECTED Final   Carbapenem resistance VIM NOT DETECTED NOT DETECTED Final    Comment: Performed at [facility], [address], [place], [place] [address]  SARS Coronavirus 2 by RT PCR (hospital order, performed in [facility] hospital lab) *cepheid single result test* Anterior Nasal Swab     Status: None   Collection Time: 01/27/23 11:42 AM   Specimen: Anterior Nasal Swab  Result Value Ref Range Status   SARS Coronavirus 2 by RT PCR NEGATIVE NEGATIVE Final    Comment: Performed at [facility], [address], [place], [place] [address]  Culture, blood (Routine x 2)     Status: None (Preliminary result)    Collection Time: 01/27/23 11:50 AM   Specimen: BLOOD RIGHT HAND  Result Value Ref Range Status   Specimen Description BLOOD RIGHT HAND  Final   Special Requests   Final    BOTTLES DRAWN AEROBIC AND ANAEROBIC Blood Culture adequate volume   Culture   Final    NO GROWTH 2 DAYS Performed at [facility], [address], [place], [place] [address]    Report Status PENDING  Incomplete  MRSA Next Gen by PCR, Nasal     Status: None   Collection Time: 01/27/23  3:09 PM   Specimen: Nasal Mucosa; Nasal Swab  Result Value Ref Range Status   MRSA by PCR Next Gen NOT DETECTED NOT DETECTED Final    Comment: (NOTE) The GeneXpert MRSA Assay (FDA approved for NASAL specimens only), is one component of a comprehensive MRSA colonization surveillance program. It is not intended to diagnose MRSA infection nor to guide or monitor treatment for MRSA infections. Test performance is not FDA approved in patients less than 20 years old. Performed at [facility], [address], [place], [place] [address]      Marcos Eke, NP [facility]  01/29/2023  1:03 PM

## 2023-01-29 NOTE — Progress Notes (Signed)
Pharmacy Antibiotic Note  [name] is a 47 y.o. male admitted on 01/27/2023  with known history of MRSA foot osteo/abscess recently treated with Vancomycin for 4 weeks with HD (EOT ~8/2). This admission found to have proteus mirabilis bacteremia - possible sources include foot osteo vs femoral HD cath. Pharmacy has been consulted to resume Vancomycin + Flagyl, narrow Cefepime to Rocephin for now.   The patient was loaded with Vancomycin 2500 mg IV x 1 dose on 8/20, CRRT was planned but never started. The patient went to HD 8/21 (off schedule) and tolerated ~4h at a lower BFR of 300, a random level this morning was therapeutic at 24 mcg/ml (goal of 15-25 mcg/ml for ESRD patients)  Noted [facility]VS evaluating the patient's access, current HD cath appears to be occluded. Will follow up on plans for HD - the patient might stay off-schedule, will monitor.   Plan: - No standing Vancomycin doses for now - levels therapeutic, will follow-up on HD schedule - Narrow Cefepime to Rocephin for now pending Proteus susceptibilities - Resume Flagyl 500 mg po bid - Will continue to follow HD schedule/duration, culture results, LOT, and antibiotic de-escalation plans   Height: 6\' 2"  (188 cm) Weight: (!) 141.6 kg (312 lb 2.7 oz) IBW/kg (Calculated) : 82.2  Temp (24hrs), Avg:98.7 F (37.1 C), Min:97.6 F (36.4 C), Max:100 F (37.8 C)  Recent Labs  Lab 01/27/23 1114 01/27/23 1125 01/27/23 1523 01/27/23 1742 01/27/23 2140 01/28/23 0630 01/29/23 0833  WBC 5.6  --   --   --   --  9.2 9.4  CREATININE 19.73*  --   --   --  20.17* 19.76*  --   LATICACIDVEN  --  2.2* 1.0 1.0  --   --   --   VANCORANDOM  --   --   --   --   --  32 24    Estimated Creatinine Clearance: 6.9 mL/min (A) (by C-G formula based on SCr of 19.76 mg/dL (H)).    No Known Allergies  Antimicrobials this admission: Cefepime 8/20 >> Rocephin 8/20 >> Flagyl 8/20 >> 8/21; restart 8/22 Vancomycin 8/20 >>  Dose adjustments this  admission: N/a  Microbiology results: 8/20 COVID >> neg 8/20 MRSA PCR >> neg 8/20 BCx >> 1/4 Proteus mirabilis 8/22 BCx >>   Thank you for allowing pharmacy to be a part of this patient's care.  Georgina Pillion, PharmD, BCPS, BCIDP Infectious Diseases Clinical Pharmacist 01/29/2023 3:49 PM   **Pharmacist phone directory can now be found on [email or web address] ([email or web address]).  Listed under [facility].

## 2023-01-29 NOTE — Progress Notes (Signed)
NEW ADMISSION NOTE   Arrival Method: ED stretcher Mental Orientation: AAOx4 Telemetry: 6M Assessment: Completed Skin: See flowsheet IV: RAC and LH Pain: 0/10 Tubes: femoral HD cath Safety Measures: Safety Fall Prevention Plan has been given, discussed and signed Admission: Completed [facility] Orientation: Patient has been orientated to the room, unit and staff.  Family: none at bedside   Orders have been reviewed and implemented. Will continue to monitor the patient. Call light has been placed within reach and bed alarm has been activated.

## 2023-01-30 DIAGNOSIS — S81809A Unspecified open wound, unspecified lower leg, initial encounter: Secondary | ICD-10-CM

## 2023-01-30 DIAGNOSIS — B964 Proteus (mirabilis) (morganii) as the cause of diseases classified elsewhere: Secondary | ICD-10-CM | POA: Diagnosis not present

## 2023-01-30 DIAGNOSIS — R7881 Bacteremia: Secondary | ICD-10-CM | POA: Diagnosis not present

## 2023-01-30 DIAGNOSIS — S81801A Unspecified open wound, right lower leg, initial encounter: Secondary | ICD-10-CM

## 2023-01-30 DIAGNOSIS — M86471 Chronic osteomyelitis with draining sinus, right ankle and foot: Secondary | ICD-10-CM | POA: Diagnosis not present

## 2023-01-30 DIAGNOSIS — A419 Sepsis, unspecified organism: Secondary | ICD-10-CM | POA: Diagnosis not present

## 2023-01-30 LAB — CULTURE, BLOOD (ROUTINE X 2): Special Requests: ADEQUATE

## 2023-01-30 LAB — CBC
HCT: 25.5 % — ABNORMAL LOW (ref 39.0–52.0)
Hemoglobin: 8.2 g/dL — ABNORMAL LOW (ref 13.0–17.0)
MCH: 27.2 pg (ref 26.0–34.0)
MCHC: 32.2 g/dL (ref 30.0–36.0)
MCV: 84.7 fL (ref 80.0–100.0)
Platelets: 303 10*3/uL (ref 150–400)
RBC: 3.01 MIL/uL — ABNORMAL LOW (ref 4.22–5.81)
RDW: 18.6 % — ABNORMAL HIGH (ref 11.5–15.5)
WBC: 11.1 10*3/uL — ABNORMAL HIGH (ref 4.0–10.5)
nRBC: 0 % (ref 0.0–0.2)

## 2023-01-30 LAB — RENAL FUNCTION PANEL
Albumin: 2.8 g/dL — ABNORMAL LOW (ref 3.5–5.0)
Anion gap: 20 — ABNORMAL HIGH (ref 5–15)
BUN: 107 mg/dL — ABNORMAL HIGH (ref 6–20)
CO2: 15 mmol/L — ABNORMAL LOW (ref 22–32)
Calcium: 8.3 mg/dL — ABNORMAL LOW (ref 8.9–10.3)
Chloride: 96 mmol/L — ABNORMAL LOW (ref 98–111)
Creatinine, Ser: 20.75 mg/dL — ABNORMAL HIGH (ref 0.61–1.24)
GFR, Estimated: 2 mL/min — ABNORMAL LOW (ref >60)
Glucose, Bld: 85 mg/dL (ref 70–99)
Phosphorus: 6.3 mg/dL — ABNORMAL HIGH (ref 2.5–4.6)
Potassium: 5.5 mmol/L — ABNORMAL HIGH (ref 3.5–5.1)
Sodium: 131 mmol/L — ABNORMAL LOW (ref 135–145)

## 2023-01-30 LAB — APTT
aPTT: 43 s — ABNORMAL HIGH (ref 24–36)
aPTT: 46 seconds — ABNORMAL HIGH (ref 24–36)
aPTT: 57 seconds — ABNORMAL HIGH (ref 24–36)

## 2023-01-30 LAB — BASIC METABOLIC PANEL
Anion gap: 18 — ABNORMAL HIGH (ref 5–15)
BUN: 102 mg/dL — ABNORMAL HIGH (ref 6–20)
CO2: 17 mmol/L — ABNORMAL LOW (ref 22–32)
Calcium: 8.2 mg/dL — ABNORMAL LOW (ref 8.9–10.3)
Chloride: 95 mmol/L — ABNORMAL LOW (ref 98–111)
Creatinine, Ser: 20.48 mg/dL — ABNORMAL HIGH (ref 0.61–1.24)
GFR, Estimated: 2 mL/min — ABNORMAL LOW (ref >60)
Glucose, Bld: 85 mg/dL (ref 70–99)
Potassium: 5.9 mmol/L — ABNORMAL HIGH (ref 3.5–5.1)
Sodium: 130 mmol/L — ABNORMAL LOW (ref 135–145)

## 2023-01-30 LAB — HEPARIN LEVEL (UNFRACTIONATED): Heparin Unfractionated: 0.55 [IU]/mL (ref 0.30–0.70)

## 2023-01-30 MED ORDER — ANTICOAGULANT SODIUM CITRATE 4% (200MG/5ML) IV SOLN: 5.0000 mL | Status: DC | PRN

## 2023-01-30 MED ORDER — LIDOCAINE HCL (PF) 1 % IJ SOLN: 5.0000 mL | INTRAMUSCULAR | Status: DC | PRN

## 2023-01-30 MED ORDER — PENTAFLUOROPROP-TETRAFLUOROETH EX AERO: 1.0000 | INHALATION_SPRAY | CUTANEOUS | Status: DC | PRN

## 2023-01-30 MED ORDER — HEPARIN SODIUM (PORCINE) 1000 UNIT/ML DIALYSIS: 1000.0000 [IU] | INTRAMUSCULAR | Status: DC | PRN

## 2023-01-30 MED ORDER — SODIUM ZIRCONIUM CYCLOSILICATE 10 G PO PACK: 10.0000 g | PACK | Freq: Two times a day (BID) | ORAL | Status: DC

## 2023-01-30 MED ORDER — LIDOCAINE-PRILOCAINE 2.5-2.5 % EX CREA: 1.0000 | TOPICAL_CREAM | CUTANEOUS | Status: DC | PRN

## 2023-01-30 MED ORDER — ALTEPLASE 2 MG IJ SOLR
2.0000 mg | Freq: Once | INTRAMUSCULAR | Status: AC | PRN
  Administered 2023-01-30: 1.9 mg
  Filled 2023-01-30: qty 2

## 2023-01-30 MED ORDER — SODIUM ZIRCONIUM CYCLOSILICATE 10 G PO PACK
10.0000 g | PACK | Freq: Two times a day (BID) | ORAL | Status: DC
  Administered 2023-01-30 – 2023-01-31 (×2): 10 g via ORAL
  Filled 2023-01-30 (×2): qty 1

## 2023-01-30 MED ORDER — CEFAZOLIN SODIUM-DEXTROSE 1-4 GM/50ML-% IV SOLN
1.0000 g | INTRAVENOUS | Status: DC
  Administered 2023-01-30 – 2023-02-04 (×6): 1 g via INTRAVENOUS
  Filled 2023-01-30 (×7): qty 50

## 2023-01-30 MED ORDER — ALTEPLASE 2 MG IJ SOLR
INTRAMUSCULAR | Status: AC
  Administered 2023-01-30: 1.9 mg
  Filled 2023-01-30: qty 2

## 2023-01-30 MED ORDER — HEPARIN SODIUM (PORCINE) 1000 UNIT/ML DIALYSIS
5000.0000 [IU] | Freq: Once | INTRAMUSCULAR | Status: DC
  Filled 2023-01-30 (×2): qty 5

## 2023-01-30 NOTE — Progress Notes (Addendum)
PROGRESS NOTE  [name]  WUJ:[id number] DOB: [birth year] DOA: 01/27/2023 PCP: [name], MD   Brief Narrative: Patient is a 47 year old male with history of ESRD, failed renal transplant, right lower extremity osteomyelitis status post first ray transmetatarsal amputation who was brought from outpatient dialysis center for further evaluation of hypotension.  He was recently treated with prolonged course of antibiotics for right lower extremity osteomyelitis .patient was also having thick drainage from his left femoral tunneled catheter for 3 days.  Patient was suspected to have septic shock and admitted under PCCM service, started on pressors.  Blood cultures showed proteus.  ID, nephrology, vascular surgery following.  Patient transferred to [facility] service on 8/22.  Plan for transhepatic catheter placement for dialysis  Assessment & Plan:  Active Problems:   Paroxysmal atrial fibrillation (HCC)   Septic shock (HCC)   Chronic anticoagulation   Gram-negative bacteremia   ESRD on hemodialysis (HCC)   Septic shock/gram-negative bacteremia/HD line infection: Presented with hypotension.  Patient was also having thick drainage from his left femoral tunneled catheter for 3 days.  Patient was suspected to have septic shock and admitted under PCCM service, started on pressors.  Blood cultures showing Proteus.  Currently on ceftriaxone, Vanco, Flagyl.  Continue follow-up final culture report. Sepsis physiology has resolved, lactic acidosis has resolved .currently blood pressure stable.  Patient has chronic hypotension and is on midodrine.  ESRD: Nephrology following.  Concern for HD line infection.  Patient has access in the left femoral area.  Complicated vascular access situation.  Vascular surgery following,he was  planned for graft placement in few weeks.  Currently he has no options for tunneled dialysis catheter placement.  Vascular surgery discussed with IR.Underwent  fluoroscopy guided  exchange of left femoral hemodialysis catheter by IR on 8/22.now the plan is for transhepatic catheter placement as current catheter is not working.We will continue phosphate binders, has mild hyperkalemia.  Given Lokelma  Recent right lower extremity osteomyelitis: Status post first ray amputation.  MRI recommended by ID,now shows changes of osteomyelitis.  X-ray without periosteal changes.  Continue current antibiotics.ID following.  I have requested podiatry consultation.  Hyponatremia: Likely from volume overload.  Volume management as per dialysis  Anemia of chronic disease: Continue to monitor hemoglobin.  Transfuse if hemoglobin drops less than 7  Paroxysmal A-fib: Currently in normal sinus rhythm.  Monitor on telemetry.  Currently Eliquis is on hold.  On heparin drip for possible upcoming procedures  Hyperglycemia: A1c of 5.9 as per 6/24.  Monitor blood sugars  Morbid obesity: BMI 40       DVT prophylaxis:iv heparin     Code Status: Full Code  Family Communication: called and discussed with mother Darl Pikes on phone on 8/23  Patient status:Inpatient  Patient is from :home  Anticipated discharge NF:AOZH  Estimated DC date:not sure   Consultants: ID, PCCM, nephrology, vascular surgery  Procedures: Dialysis, exchange of dialysis catheter  Antimicrobials:  Anti-infectives (From admission, onward)    Start     Dose/Rate Route Frequency Ordered Stop   01/29/23 2200  metroNIDAZOLE (FLAGYL) tablet 500 mg        500 mg Oral Every 12 hours 01/29/23 1509     01/29/23 2200  cefTRIAXone (ROCEPHIN) 2 g in sodium chloride 0.9 % 100 mL IVPB        2 g 200 mL/hr over 30 Minutes Intravenous Every 24 hours 01/29/23 1548     01/29/23 1549  vancomycin variable dose per unstable renal function (pharmacist  dosing)         Does not apply See admin instructions 01/29/23 1549     01/29/23 0600  ceFEPIme (MAXIPIME) 1 g in sodium chloride 0.9 % 100 mL IVPB  Status:  Discontinued        1  g 200 mL/hr over 30 Minutes Intravenous Every 24 hours 01/28/23 1006 01/29/23 1548   01/28/23 1600  ceFEPIme (MAXIPIME) 1 g in sodium chloride 0.9 % 100 mL IVPB  Status:  Discontinued        1 g 200 mL/hr over 30 Minutes Intravenous Every 24 hours 01/27/23 1715 01/27/23 1759   01/28/23 0400  ceFEPIme (MAXIPIME) 2 g in sodium chloride 0.9 % 100 mL IVPB  Status:  Discontinued        2 g 200 mL/hr over 30 Minutes Intravenous Every 12 hours 01/27/23 1759 01/28/23 1006   01/27/23 1811  vancomycin variable dose per unstable renal function (pharmacist dosing)  Status:  Discontinued         Does not apply See admin instructions 01/27/23 1811 01/28/23 0959   01/27/23 1445  metroNIDAZOLE (FLAGYL) IVPB 500 mg  Status:  Discontinued        500 mg 100 mL/hr over 60 Minutes Intravenous Every 12 hours 01/27/23 1434 01/28/23 1010   01/27/23 1445  ceFEPIme (MAXIPIME) 1 g in sodium chloride 0.9 % 100 mL IVPB        1 g 200 mL/hr over 30 Minutes Intravenous  Once 01/27/23 1440 01/27/23 1618   01/27/23 1115  cefTRIAXone (ROCEPHIN) 2 g in sodium chloride 0.9 % 100 mL IVPB        2 g 200 mL/hr over 30 Minutes Intravenous  Once 01/27/23 1101 01/27/23 1212   01/27/23 1115  vancomycin (VANCOCIN) 2,500 mg in sodium chloride 0.9 % 500 mL IVPB        2,500 mg 262.5 mL/hr over 120 Minutes Intravenous  Once 01/27/23 1108 01/27/23 1534       Subjective: Patient seen and examined at bedside today.  Hemodynamically stable.  Apparently the hemodialysis catheter at the femoral side did not work at dialysis.  He was not in any kind of distress this morning.  Denies any discomfort or pain.  Objective: Vitals:   01/30/23 0736 01/30/23 0757 01/30/23 0807 01/30/23 0847  BP: 105/69  107/84   Pulse: 72  63   Resp: 20  (!) 24   Temp: 98 F (36.7 C)     TempSrc: Oral     SpO2: 100%  100%   Weight:  (!) 141.8 kg  (!) 141.8 kg  Height:        Intake/Output Summary (Last 24 hours) at 01/30/2023 1348 Last data filed at  01/30/2023 0865 Gross per 24 hour  Intake 917.37 ml  Output 1 ml  Net 916.37 ml   Filed Weights   01/28/23 2100 01/30/23 0757 01/30/23 0847  Weight: (!) 141.6 kg (!) 141.8 kg (!) 141.8 kg    Examination:  General exam: Overall comfortable, not in distress, morbidly obese HEENT: PERRL Respiratory system:  no wheezes or crackles  Cardiovascular system: S1 & S2 heard, RRR.  Gastrointestinal system: Abdomen is nondistended, soft and nontender. Central nervous system: Alert and oriented Extremities: No edema, no clubbing ,no cyanosis, femoral catheter on the left groin,first ray  amputation of right foot with gangrenous changes Skin: No rashes, no ulcers,no icterus     Data Reviewed: I have personally reviewed following labs and imaging studies  CBC:  Recent Labs  Lab 01/27/23 1114 01/28/23 0630 01/29/23 0833 01/30/23 0505  WBC 5.6 9.2 9.4 11.1*  NEUTROABS 4.4  --   --   --   HGB 8.5* 9.5* 8.0* 8.2*  HCT 26.8* 29.9* 24.9* 25.5*  MCV 86.7 88.7 86.2 84.7  PLT 214 227 271 303   Basic Metabolic Panel: Recent Labs  Lab 01/27/23 1114 01/27/23 2140 01/28/23 0630 01/30/23 0505 01/30/23 0722  NA 132* 132* 130* 130* 131*  K 5.6* 5.5* 5.9* 5.9* 5.5*  CL 95* 95* 94* 95* 96*  CO2 19* 21* 16* 17* 15*  GLUCOSE 95 106* 103* 85 85  BUN 92* 92* 99* 102* 107*  CREATININE 19.73* 20.17* 19.76* 20.48* 20.75*  CALCIUM 8.4* 8.4* 8.6* 8.2* 8.3*  MG  --   --  2.1  --   --   PHOS  --   --   --   --  6.3*     Recent Results (from the past 240 hour(s))  Culture, blood (Routine x 2)     Status: Abnormal   Collection Time: 01/27/23 11:00 AM   Specimen: BLOOD  Result Value Ref Range Status   Specimen Description BLOOD RIGHT ANTECUBITAL  Final   Special Requests   Final    BOTTLES DRAWN AEROBIC AND ANAEROBIC Blood Culture adequate volume   Culture  Setup Time   Final    GRAM NEGATIVE RODS IN BOTH AEROBIC AND ANAEROBIC BOTTLES CRITICAL RESULT CALLED TO, READ BACK BY AND VERIFIED WITH: V  BRYK,PHARMD@[id number]  01/28/23 MK Performed at [facility], [address] [place], [place] [address]    Culture PROTEUS MIRABILIS (A)  Final   Report Status 01/30/2023 FINAL  Final   Organism ID, Bacteria PROTEUS MIRABILIS  Final      Susceptibility   Proteus mirabilis - MIC*    AMPICILLIN <=2 SENSITIVE Sensitive     CEFEPIME <=0.12 SENSITIVE Sensitive     CEFTAZIDIME <=1 SENSITIVE Sensitive     CEFTRIAXONE <=0.25 SENSITIVE Sensitive     CIPROFLOXACIN <=0.25 SENSITIVE Sensitive     GENTAMICIN <=1 SENSITIVE Sensitive     IMIPENEM 2 SENSITIVE Sensitive     TRIMETH/SULFA <=20 SENSITIVE Sensitive     AMPICILLIN/SULBACTAM <=2 SENSITIVE Sensitive     PIP/TAZO <=4 SENSITIVE Sensitive     * PROTEUS MIRABILIS  Blood Culture ID Panel (Reflexed)     Status: Abnormal   Collection Time: 01/27/23 11:00 AM  Result Value Ref Range Status   Enterococcus faecalis NOT DETECTED NOT DETECTED Final   Enterococcus Faecium NOT DETECTED NOT DETECTED Final   Listeria monocytogenes NOT DETECTED NOT DETECTED Final   Staphylococcus species NOT DETECTED NOT DETECTED Final   Staphylococcus aureus (BCID) NOT DETECTED NOT DETECTED Final   Staphylococcus epidermidis NOT DETECTED NOT DETECTED Final   Staphylococcus lugdunensis NOT DETECTED NOT DETECTED Final   Streptococcus species NOT DETECTED NOT DETECTED Final   Streptococcus agalactiae NOT DETECTED NOT DETECTED Final   Streptococcus pneumoniae NOT DETECTED NOT DETECTED Final   Streptococcus pyogenes NOT DETECTED NOT DETECTED Final   A.calcoaceticus-baumannii NOT DETECTED NOT DETECTED Final   Bacteroides fragilis NOT DETECTED NOT DETECTED Final   Enterobacterales DETECTED (A) NOT DETECTED Final    Comment: Enterobacterales represent a large order of gram negative bacteria, not a single organism. CRITICAL RESULT CALLED TO, READ BACK BY AND VERIFIED WITH: V BRYK,PHARMD@[email or web address]  01/28/23 MK    Enterobacter cloacae complex NOT DETECTED NOT DETECTED Final    Escherichia coli NOT DETECTED NOT  DETECTED Final   Klebsiella aerogenes NOT DETECTED NOT DETECTED Final   Klebsiella oxytoca NOT DETECTED NOT DETECTED Final   Klebsiella pneumoniae NOT DETECTED NOT DETECTED Final   Proteus species DETECTED (A) NOT DETECTED Final    Comment: CRITICAL RESULT CALLED TO, READ BACK BY AND VERIFIED WITH: V BRYK,PHARMD@[id number]  01/28/23 MK    Salmonella species NOT DETECTED NOT DETECTED Final   Serratia marcescens NOT DETECTED NOT DETECTED Final   Haemophilus influenzae NOT DETECTED NOT DETECTED Final   Neisseria meningitidis NOT DETECTED NOT DETECTED Final   Pseudomonas aeruginosa NOT DETECTED NOT DETECTED Final   Stenotrophomonas maltophilia NOT DETECTED NOT DETECTED Final   Candida albicans NOT DETECTED NOT DETECTED Final   Candida auris NOT DETECTED NOT DETECTED Final   Candida glabrata NOT DETECTED NOT DETECTED Final   Candida krusei NOT DETECTED NOT DETECTED Final   Candida parapsilosis NOT DETECTED NOT DETECTED Final   Candida tropicalis NOT DETECTED NOT DETECTED Final   Cryptococcus neoformans/gattii NOT DETECTED NOT DETECTED Final   CTX-M ESBL NOT DETECTED NOT DETECTED Final   Carbapenem resistance IMP NOT DETECTED NOT DETECTED Final   Carbapenem resistance KPC NOT DETECTED NOT DETECTED Final   Carbapenem resistance NDM NOT DETECTED NOT DETECTED Final   Carbapenem resist OXA 48 LIKE NOT DETECTED NOT DETECTED Final   Carbapenem resistance VIM NOT DETECTED NOT DETECTED Final    Comment: Performed at [facility], [address] [place], [place] [address]  SARS Coronavirus 2 by RT PCR (hospital order, performed in [facility] hospital lab) *cepheid single result test* Anterior Nasal Swab     Status: None   Collection Time: 01/27/23 11:42 AM   Specimen: Anterior Nasal Swab  Result Value Ref Range Status   SARS Coronavirus 2 by RT PCR NEGATIVE NEGATIVE Final    Comment: Performed at [facility], [address], [place], [place] [address]   Culture, blood (Routine x 2)     Status: None (Preliminary result)   Collection Time: 01/27/23 11:50 AM   Specimen: BLOOD RIGHT HAND  Result Value Ref Range Status   Specimen Description BLOOD RIGHT HAND  Final   Special Requests   Final    BOTTLES DRAWN AEROBIC AND ANAEROBIC Blood Culture adequate volume   Culture   Final    NO GROWTH 3 DAYS Performed at [facility], [address], [place], [place] [address]    Report Status PENDING  Incomplete  MRSA Next Gen by PCR, Nasal     Status: None   Collection Time: 01/27/23  3:09 PM   Specimen: Nasal Mucosa; Nasal Swab  Result Value Ref Range Status   MRSA by PCR Next Gen NOT DETECTED NOT DETECTED Final    Comment: (NOTE) The GeneXpert MRSA Assay (FDA approved for NASAL specimens only), is one component of a comprehensive MRSA colonization surveillance program. It is not intended to diagnose MRSA infection nor to guide or monitor treatment for MRSA infections. Test performance is not FDA approved in patients less than 34 years old. Performed at [facility], [address] [place], [place] [address]   Culture, blood (Routine X 2) w Reflex to ID Panel     Status: None (Preliminary result)   Collection Time: 01/29/23  8:33 AM   Specimen: BLOOD LEFT ARM  Result Value Ref Range Status   Specimen Description BLOOD LEFT ARM  Final   Special Requests   Final    BOTTLES DRAWN AEROBIC ONLY  Blood Culture adequate volume   Culture   Final    NO GROWTH < 24 HOURS Performed at [facility], [address], [place], [place] [address]    Report Status PENDING  Incomplete  Culture, blood (Routine X 2) w Reflex to ID Panel     Status: None (Preliminary result)   Collection Time: 01/29/23  8:35 AM   Specimen: BLOOD RIGHT HAND  Result Value Ref Range Status   Specimen Description BLOOD RIGHT HAND  Final   Special Requests   Final    BOTTLES DRAWN AEROBIC ONLY Blood Culture adequate volume   Culture   Final    NO GROWTH <  24 HOURS Performed at [facility], [address] [place], [place] [address]    Report Status PENDING  Incomplete     Radiology Studies: CT ABDOMEN PELVIS W CONTRAST  Result Date: 01/29/2023 CLINICAL DATA:  47 year old male with a history of renal failure, difficult access, possible transhepatic catheter. EXAM: CT ABDOMEN AND PELVIS WITH CONTRAST TECHNIQUE: Multidetector CT imaging of the abdomen and pelvis was performed using the standard protocol following bolus administration of intravenous contrast. RADIATION DOSE REDUCTION: This exam was performed according to the departmental dose-optimization program which includes automated exposure control, adjustment of the mA and/or kV according to patient size and/or use of iterative reconstruction technique. CONTRAST:  75mL OMNIPAQUE IOHEXOL 350 MG/ML SOLN COMPARISON:  04/14/2011 FINDINGS: Lower chest: No acute finding of the lower chest. Hepatobiliary: Timing of the contrast bolus is delayed secondary to collateral venous drainage centrally. Hepatomegaly. No focal lesion. Focal fatty infiltration adjacent to the falciform ligament. Gallbladder is decompressed. Pancreas: Unremarkable Spleen: Unremarkable Adrenals/Urinary Tract: - Right adrenal gland:  Unremarkable - Left adrenal gland: Unremarkable. - Right kidney: Atrophic right kidney with nephrolithiasis. No hydronephrosis. - Left Kidney: Atrophic left kidney with nephrolithiasis. No hydronephrosis. - Urinary Bladder: Urinary bladder is decompressed. Stomach/Bowel: - Stomach: Unremarkable. - Small bowel: Unremarkable - Appendix: Normal - Colon: Colon decompressed. Colonic diverticula of the left colon. No acute inflammatory changes. Vascular/Lymphatic: Calcifications of the coronary arteries of the visualized heart. Partial opacification of collateral draining veins of the left chest. Hepatic veins not well opacified given the timing of the contrast bolus. Portal veins not well opacified given the  timing of the contrast bolus. Mild atherosclerotic changes of the abdominal aorta. Mild atherosclerotic changes of the bilateral iliac arteries. Proximal femoral arteries are patent. Tunneled left femoral dialysis catheter. Multiple lymph nodes in the retroperitoneum with associated edema. 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 Surgical changes of bilateral femoral loop grafts, with surgical changes of prior excision on the right. Reproductive: Unremarkable prostate Other: Low-density cystic structure in the abdominal wall measures 5.7 cm with some rim calcification. This is secondary to a prior peritoneal dialysis catheter. Rim calcified cystic structure within the mesentery measures 5.8 cm, likely a pseudocyst related to prior peritoneal dialysis. Partially calcified structure in the right lower pelvis, compatible with failed renal transplant. Musculoskeletal: Changes of renal osteodystrophy. No acute displaced fracture. No bony canal narrowing. Unremarkable hips. IMPRESSION: No acute CT finding. Poor opacification of the hepatic and portal venous vasculature secondary to the timing of the contrast bolus. However, there are no secondary signs of hepatic vein occlusion identified in the liver. These above preliminary results were discussed by telephone at the time of interpretation on 01/29/2023 at 4:38 pm with Dr. Heath Lark. Edema of  the retroperitoneum with associated small lymph nodes, likely secondary to chronic venous congestion given the patient's known chronic IVC and iliac vein occlusions. Aortic Atherosclerosis (ICD10-I70.0). Additional ancillary findings as above. Signed, Yvone Neu. Miachel Roux, RPVI [facility] Radiology Electronically Signed   By: Gilmer Mor D.O.   On: 01/29/2023 16:39   IR Fluoro Guide CV Line Left  Result Date: 01/29/2023 INDICATION: Bacteremia.  Malfunctioning left common femoral vein approach hemodialysis catheter. Patient with longstanding history of end-stage renal disease renal disease with extremely limited dialysis catheter access, currently receiving intermittently successful dialysis from a chronic left common femoral approach hemodialysis catheter, initially placed by the interventional radiology service on 04/23/2022, by report, most recently exchanged by the vascular surgery service approximately 1 month ago. The patient presents today for fluoroscopic guided dialysis catheter exchange. EXAM: TUNNELED CENTRAL VENOUS HEMODIALYSIS CATHETER REPLACEMENT WITH FLUOROSCOPIC GUIDANCE MEDICATIONS: None CONTRAST:  25 cc Omnipaque 300 FLUOROSCOPY TIME:  367 mGy COMPLICATIONS: None immediate. PROCEDURE: Informed written consent was obtained from the patient after a discussion of the risks, benefits, and alternatives to treatment. Questions regarding the procedure were encouraged and answered. The skin and external portion of the existing hemodialysis catheter was prepped with chlorhexidine in a sterile fashion, and a sterile drape was applied covering the operative field. Maximum barrier sterile technique with sterile gowns and gloves were used for the procedure. A timeout was performed prior to the initiation of the procedure. Preprocedural spot fluoroscopic image was obtained of the left pelvis and left common femoral vein approach hemodialysis catheter. The hemodialysis catheter was noted to be located within an atypical positioned, to the left of the L5 vertebral body. Neither lumen of the pre-existing hemodialysis catheter were able to be aspirated. Pull-back venogram performed via the pre-existing hemodialysis catheter demonstrates chronic occlusion of both the IVC as well as the pelvic venous system with filling of multiple para lumbar venous collaterals. Next, two stiff glide wires were advanced through both lumens of the dialysis catheter and  exchanged for a new, 23 cm tip to cuff hemodialysis catheter. After catheter exchange, neither lumen was noted to aspirate. As such, the catheter was slightly retracted to the level of the left external iliac vein where some blood return was noted. The dialysis catheter was secured in place at this location. The patient tolerated the procedure well without immediate post procedural complication. IMPRESSION: Technically successful fluoroscopic guided exchange and repositioning of 23 cm tip to cuff tunneled left common femoral vein approach hemodialysis catheter with tip terminating within the left external iliac vein. Neither lumen of the exchanged dialysis catheter was able to be easily aspirated however the pre-existing catheter demonstrated similar functionality and reportedly has been serving as a source of intermittent successful dialysis and as such the exchanged catheter was left in place for attempted dialysis. PLAN: If this exchanged dialysis catheter does NOT serve as a durable source of successful dialysis, agree with evaluation for consideration of trans hepatic dialysis catheter placement as patient has exhausted all other options for dialysis access. Electronically Signed   By: Simonne Come M.D.   On: 01/29/2023 16:14   IR Veno/Ext/Uni Left  Result Date: 01/29/2023 INDICATION: Bacteremia. Malfunctioning left common femoral vein approach hemodialysis catheter. Patient with longstanding history of end-stage renal disease renal disease with extremely limited dialysis catheter access, currently receiving intermittently successful dialysis from a chronic left common femoral approach hemodialysis catheter, initially placed by the interventional radiology service on 04/23/2022, by report, most recently  exchanged by the vascular surgery service approximately 1 month ago. The patient presents today for fluoroscopic guided dialysis catheter exchange. EXAM: TUNNELED CENTRAL VENOUS HEMODIALYSIS CATHETER  REPLACEMENT WITH FLUOROSCOPIC GUIDANCE MEDICATIONS: None CONTRAST:  25 cc Omnipaque 300 FLUOROSCOPY TIME:  367 mGy COMPLICATIONS: None immediate. PROCEDURE: Informed written consent was obtained from the patient after a discussion of the risks, benefits, and alternatives to treatment. Questions regarding the procedure were encouraged and answered. The skin and external portion of the existing hemodialysis catheter was prepped with chlorhexidine in a sterile fashion, and a sterile drape was applied covering the operative field. Maximum barrier sterile technique with sterile gowns and gloves were used for the procedure. A timeout was performed prior to the initiation of the procedure. Preprocedural spot fluoroscopic image was obtained of the left pelvis and left common femoral vein approach hemodialysis catheter. The hemodialysis catheter was noted to be located within an atypical positioned, to the left of the L5 vertebral body. Neither lumen of the pre-existing hemodialysis catheter were able to be aspirated. Pull-back venogram performed via the pre-existing hemodialysis catheter demonstrates chronic occlusion of both the IVC as well as the pelvic venous system with filling of multiple para lumbar venous collaterals. Next, two stiff glide wires were advanced through both lumens of the dialysis catheter and exchanged for a new, 23 cm tip to cuff hemodialysis catheter. After catheter exchange, neither lumen was noted to aspirate. As such, the catheter was slightly retracted to the level of the left external iliac vein where some blood return was noted. The dialysis catheter was secured in place at this location. The patient tolerated the procedure well without immediate post procedural complication. IMPRESSION: Technically successful fluoroscopic guided exchange and repositioning of 23 cm tip to cuff tunneled left common femoral vein approach hemodialysis catheter with tip terminating within the left external iliac  vein. Neither lumen of the exchanged dialysis catheter was able to be easily aspirated however the pre-existing catheter demonstrated similar functionality and reportedly has been serving as a source of intermittent successful dialysis and as such the exchanged catheter was left in place for attempted dialysis. PLAN: If this exchanged dialysis catheter does NOT serve as a durable source of successful dialysis, agree with evaluation for consideration of trans hepatic dialysis catheter placement as patient has exhausted all other options for dialysis access. Electronically Signed   By: Simonne Come M.D.   On: 01/29/2023 16:14   MR FOOT RIGHT WO CONTRAST  Result Date: 01/29/2023 CLINICAL DATA:  Right foot infection. Great toe amputation on 12/12/2022 EXAM: MRI OF THE RIGHT FOREFOOT WITHOUT CONTRAST TECHNIQUE: Multiplanar, multisequence MR imaging of the right forefoot was performed. No intravenous contrast was administered. COMPARISON:  X-ray 01/27/2023 FINDINGS: Bones/Joint/Cartilage Postsurgical changes from transmetatarsal amputation of the first ray. There is bone marrow edema within the distal 7 mm of the residual first metatarsal diaphysis at the resection margin. No confluent low T1 signal changes at this location. The remaining bony structures of the forefoot are otherwise within normal limits. No fracture or dislocation. No additional sites of focal bone marrow edema or marrow replacement. No joint effusions. Ligaments Intact Lisfranc ligament.  Intact collateral ligaments. Muscles and Tendons Diffuse edema-like signal of the foot musculature which may represent changes related to denervation and/or myositis. No tenosynovitis. Soft tissues Diffuse soft tissue swelling. Irregularity within the skin and superficial soft tissues at the medial aspect of the forefoot, likely along the surgical incision site. There is fluid and edema which tracks to the resection  margin of the first metatarsal (series 4, image  29). There are no organized or drainable fluid collections. IMPRESSION: 1. 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 There is bone marrow edema within the distal aspect of the residual first metatarsal diaphysis at the resection margin. Appearance is most suggestive of early acute osteomyelitis given the appearance of the overlying soft tissues. 2. Fluid and edema tracks to the resection margin of the first metatarsal. 3. No organized or drainable fluid collections. 4. Diffuse edema-like signal of the foot musculature which may represent changes related to denervation and/or myositis. Electronically Signed   By: Duanne Guess D.O.   On: 01/29/2023 14:39    Scheduled Meds:  (feeding supplement) PROSource Plus  30 mL Oral BID BM   Chlorhexidine Gluconate Cloth  6 each Topical Daily   Chlorhexidine Gluconate Cloth  6 each Topical Q0600   darbepoetin (ARANESP) injection - DIALYSIS  150 mcg Subcutaneous Q Thu-1800   ferric citrate  630 mg Oral TID WC   metroNIDAZOLE  500 mg Oral Q12H   midodrine  15 mg Oral TID WC   sodium zirconium cyclosilicate  10 g Oral BID   vancomycin variable dose per unstable renal function (pharmacist dosing)   Does not apply See admin instructions   Continuous Infusions:  sodium chloride Stopped (01/30/23 0050)   cefTRIAXone (ROCEPHIN)  IV Stopped (01/30/23 0050)   heparin 2,100 Units/hr (01/30/23 0730)     LOS: 3 days   Burnadette Pop, MD [facility] P8/23/2024, 1:48 PM

## 2023-01-30 NOTE — Consult Note (Signed)
Reason for Consult:right foot infection Referring Physician: Dr Tilden Dome [name] is an 47 y.o. male.  HPI: patient is known to my partner Dr Ralene Cork for right foot infection and underwent partial R first ray amputation in July, did not follow up until August. He was readmitted for sepsis, suspected source is foot vs TDC. He says he does not have any questions and has been down about every health issue he has been dealing with.   Past Medical History:  Diagnosis Date   Acute respiratory failure with hypoxia (HCC) 11/30/2018   Anemia    ESRD   ESRD on hemodialysis (HCC) 06/07/2011   TTS [place] [facility]. Started HD in 2006, got transplant in 2014 lasted until Dec 2019 then went back on HD.  Has L thigh AVG as of Jun 2020.  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    History of blood transfusion    Hypertension    03/19/22- has not had high blood pressure in 5 years.   Morbid obesity (HCC)    Paroxysmal atrial fibrillation (HCC)    Pre-diabetes    no meds   Sepsis (HCC) 11/30/2018   Sleep apnea    lost weight, does not use CPAP    Past Surgical History:  Procedure Laterality Date   AMPUTATION Right 12/12/2022   Procedure: AMPUTATION RAY;  Surgeon: Louann Sjogren, DPM;  Location: [facility];  Service: Orthopedics/Podiatry;  Laterality: Right;  surgical team to do local block   ARTERIOVENOUS GRAFT PLACEMENT  08/09/2010   Left Thigh Graft by Dr. Cari Caraway   AV FISTULA PLACEMENT Right 05/18/2018   Procedure: INSERTION OF ARTERIOVENOUS (AV) GORE-TEX GRAFT Left THIGH;  Surgeon: Maeola Harman, MD;  Location: [facility] OR;  Service: Vascular;  Laterality: Right;   AV FISTULA PLACEMENT Right 02/15/2021   Procedure: INSERTION OF ARTERIOVENOUS (AV) GORE-TEX LOOP GRAFT RIGHT THIGH;  Surgeon: Maeola Harman, MD;  Location: [facility] OR;  Service: Vascular;  Laterality: Right;   BIOPSY  05/20/2022   Procedure: BIOPSY;  Surgeon: Shellia Cleverly, DO;  Location: [facility];   Service: Gastroenterology;;   CAPD INSERTION N/A 06/13/2022   Procedure: LAPAROSCOPIC INSERTION CONTINUOUS AMBULATORY PERITONEAL DIALYSIS  (CAPD) CATHETER;  Surgeon: Maeola Harman, MD;  Location: [facility] OR;  Service: Vascular;  Laterality: N/A;   CAPD REMOVAL  05/08/2011   Procedure: CONTINUOUS AMBULATORY PERITONEAL DIALYSIS  (CAPD) CATHETER REMOVAL;  Surgeon: Iona Coach, MD;  Location: [facility] OR;  Service: General;  Laterality: N/A;  Removal of CAPD catheter, Dr. requests to go after 100   CAPD REMOVAL N/A 08/08/2022   Procedure: REMOVAL CONTINUOUS AMBULATORY PERITONEAL DIALYSIS  (CAPD) CATHETER;  Surgeon: Maeola Harman, MD;  Location: [facility] OR;  Service: Vascular;  Laterality: N/A;   COLONOSCOPY WITH PROPOFOL N/A 05/20/2022   Procedure: COLONOSCOPY WITH PROPOFOL;  Surgeon: Shellia Cleverly, DO;  [facility]: [facility];  Service: Gastroenterology;  Laterality: N/A;   I & D EXTREMITY Right 12/08/2022   Procedure: IRRIGATION AND DEBRIDEMENT RIGHT FOOT;  Surgeon: Louann Sjogren, DPM;  Location: [facility] OR;  Service: Orthopedics/Podiatry;  Laterality: Right;   INSERTION OF DIALYSIS CATHETER  10/05/2010   Right Femoral Cath insertion by Dr. Leonides Sake.  Pt ahas had several caths inserted.   INSERTION OF DIALYSIS CATHETER Right 02/15/2021   Procedure: Attempted INSERTION OF RIGHT and Left Internal Jugular DIALYSIS CATHETER, Insertion of Left Femoral Vein Dialysis Catheter;  Surgeon: Maeola Harman, MD;  Location: [facility] OR;  Service: Vascular;  Laterality: Right;   INSERTION OF DIALYSIS CATHETER Right 04/26/2021   Procedure: INSERTION OF TUNNELED 55cm PALIDROME PRECISION CHRONIC DIALYSIS CATHETER;  Surgeon: Leonie Douglas, MD;  Location: [facility];  Service: Vascular;  Laterality: Right;   INSERTION OF DIALYSIS CATHETER Left 12/26/2021   Procedure: INSERTION OF TUNNELED  DIALYSIS CATHETER LEFT FEMORAL ARTERY;  Surgeon: Maeola Harman, MD;  Location: [facility] OR;  Service: Vascular;   Laterality: Left;   INSERTION OF DIALYSIS CATHETER Left 03/06/2022   Procedure: INSERTION OF DIALYSIS CATHETER;  Surgeon: Maeola Harman, MD;  Location: [facility] OR;  Service: Vascular;  Laterality: Left;   INSERTION OF DIALYSIS CATHETER Left 12/31/2022   Procedure: INSERTION OF DIALYSIS CATHETER LEFT GROIN;  Surgeon: Leonie Douglas, MD;  Location: [facility];  Service: Vascular;  Laterality: Left;   IR FLUORO GUIDE CV LINE LEFT  04/23/2022   IR FLUORO GUIDE CV LINE LEFT  01/29/2023   IR FLUORO GUIDE CV LINE RIGHT  05/11/2018   IR FLUORO GUIDE CV LINE RIGHT  07/03/2021   IR FLUORO GUIDE CV LINE RIGHT  07/16/2021   IR PTA ADDL CENTRAL DIALYSIS SEG THRU DIALY CIRCUIT RIGHT Right 07/03/2021   IR US GUIDE VASC ACCESS LEFT  04/23/2022   IR US GUIDE VASC ACCESS RIGHT  05/11/2018   IR VENO/EXT/UNI LEFT  04/23/2022   IR VENO/EXT/UNI LEFT  01/29/2023   IR VENOCAVAGRAM IVC  07/16/2021   KIDNEY TRANSPLANT  2014   KNEE ARTHROSCOPY Left    LAPAROSCOPIC LYSIS OF ADHESIONS N/A 06/13/2022   Procedure: LAPAROSCOPIC LYSIS OF ADHESIONS;  Surgeon: Maeola Harman, MD;  Location: [facility] OR;  Service: Vascular;  Laterality: N/A;   POLYPECTOMY  05/20/2022   Procedure: POLYPECTOMY;  Surgeon: Shellia Cleverly, DO;  [facility]: [facility];  Service: Gastroenterology;;   THROMBECTOMY W/ EMBOLECTOMY Right 04/26/2021   Procedure: REMOVAL OF RIGHT THIGH ARTERIOVENOUS GORE-TEX GRAFT AND REPAIR OF RIGHT COMMON FEMORAL ARTERY;  Surgeon: Leonie Douglas, MD;  Location: [facility] OR;  Service: Vascular;  Laterality: Right;   UPPER EXTREMITY ANGIOGRAPHY Bilateral 05/13/2018   Procedure: UPPER EXTREMITY ANGIOGRAPHY - bilarteral;  Surgeon: Cephus Shelling, MD;  [facility];  Service: Cardiovascular;  Laterality: Bilateral;    Family History  Problem Relation Age of Onset   Diabetes Father    Stroke Father    Stroke Maternal Grandmother    Anesthesia problems Neg Hx     Social History:  reports that he  quit smoking about 7 years ago. His smoking use included cigarettes. He has never used smokeless tobacco. He reports that he does not currently use alcohol. He reports that he does not currently use drugs after having used the following drugs: Marijuana.  Allergies: No Known Allergies  Medications: I have reviewed the patient's current medications.  Results for orders placed or performed during the hospital encounter of 01/27/23 (from the past 48 hour(s))  Heparin level (unfractionated)     Status: Abnormal   Collection Time: 01/28/23  8:54 PM  Result Value Ref Range   Heparin Unfractionated >1.10 (H) 0.30 - 0.70 IU/mL    Comment: (NOTE) The clinical reportable range upper limit is being lowered to >1.10 to align with the FDA approved guidance for the current laboratory assay.  If heparin results are below expected values, and patient dosage has  been confirmed, suggest follow up testing of antithrombin III levels. Performed at [facility] Lab, [address], [place], [place] [address]  APTT     Status: Abnormal   Collection Time: 01/28/23  8:54 PM  Result Value Ref Range   aPTT 45 (H) 24 - 36 seconds    Comment:        IF BASELINE aPTT IS ELEVATED, SUGGEST PATIENT RISK ASSESSMENT BE USED TO DETERMINE APPROPRIATE ANTICOAGULANT THERAPY. Performed at [facility] Lab, [address], [place], [place] [address]   Glucose, capillary     Status: None   Collection Time: 01/28/23  9:04 PM  Result Value Ref Range   Glucose-Capillary 85 70 - 99 mg/dL    Comment: Glucose reference range applies only to samples taken after fasting for at least 8 hours.  CBC     Status: Abnormal   Collection Time: 01/29/23  8:33 AM  Result Value Ref Range   WBC 9.4 4.0 - 10.5 K/uL   RBC 2.89 (L) 4.22 - 5.81 MIL/uL   Hemoglobin 8.0 (L) 13.0 - 17.0 g/dL   HCT 60.4 (L) 54.0 - 98.1 %   MCV 86.2 80.0 - 100.0 fL   MCH 27.7 26.0 - 34.0 pg   MCHC 32.1 30.0 - 36.0 g/dL   RDW 19.1 (H) 47.8 - 29.5 %    Platelets 271 150 - 400 K/uL   nRBC 0.0 0.0 - 0.2 %    Comment: Performed at [facility] Lab, [address] [place], [place] [address]  Culture, blood (Routine X 2) w Reflex to ID Panel     Status: None (Preliminary result)   Collection Time: 01/29/23  8:33 AM   Specimen: BLOOD LEFT ARM  Result Value Ref Range   Specimen Description BLOOD LEFT ARM    Special Requests      BOTTLES DRAWN AEROBIC ONLY Blood Culture adequate volume   Culture      NO GROWTH < 24 HOURS Performed at [facility], [address], [place], [place] [address]    Report Status PENDING   APTT     Status: Abnormal   Collection Time: 01/29/23  8:33 AM  Result Value Ref Range   aPTT 44 (H) 24 - 36 seconds    Comment:        IF BASELINE aPTT IS ELEVATED, SUGGEST PATIENT RISK ASSESSMENT BE USED TO DETERMINE APPROPRIATE ANTICOAGULANT THERAPY. Performed at [facility], [address], [place], [place] [address]   Heparin level (unfractionated)     Status: Abnormal   Collection Time: 01/29/23  8:33 AM  Result Value Ref Range   Heparin Unfractionated 0.80 (H) 0.30 - 0.70 IU/mL    Comment: (NOTE) The clinical reportable range upper limit is being lowered to >1.10 to align with the FDA approved guidance for the current laboratory assay.  If heparin results are below expected values, and patient dosage has  been confirmed, suggest follow up testing of antithrombin III levels. Performed at [facility] Lab, [address], [place], [place] [address]   Vancomycin, random     Status: None   Collection Time: 01/29/23  8:33 AM  Result Value Ref Range   Vancomycin Rm 24 ug/mL    Comment:        Random Vancomycin therapeutic range is dependent on dosage and time of specimen collection. A peak range is 20-40 ug/mL A trough range is 5-15 ug/mL        Performed at [facility] Lab, [address] [place], [place] [address]   Culture, blood (Routine X 2) w Reflex  to ID Panel     Status: None  (Preliminary result)   Collection Time: 01/29/23  8:35 AM   Specimen: BLOOD RIGHT HAND  Result Value Ref Range   Specimen Description BLOOD RIGHT HAND    Special Requests      BOTTLES DRAWN AEROBIC ONLY Blood Culture adequate volume   Culture      NO GROWTH < 24 HOURS Performed at [facility], [address] [place], [place] [address]    Report Status PENDING   APTT     Status: Abnormal   Collection Time: 01/30/23  5:05 AM  Result Value Ref Range   aPTT 43 (H) 24 - 36 seconds    Comment:        IF BASELINE aPTT IS ELEVATED, SUGGEST PATIENT RISK ASSESSMENT BE USED TO DETERMINE APPROPRIATE ANTICOAGULANT THERAPY. Performed at [facility] Lab, [address], [place], [place] [address]   CBC     Status: Abnormal   Collection Time: 01/30/23  5:05 AM  Result Value Ref Range   WBC 11.1 (H) 4.0 - 10.5 K/uL   RBC 3.01 (L) 4.22 - 5.81 MIL/uL   Hemoglobin 8.2 (L) 13.0 - 17.0 g/dL   HCT 82.9 (L) 56.2 - 13.0 %   MCV 84.7 80.0 - 100.0 fL   MCH 27.2 26.0 - 34.0 pg   MCHC 32.2 30.0 - 36.0 g/dL   RDW 86.5 (H) 78.4 - 69.6 %   Platelets 303 150 - 400 K/uL   nRBC 0.0 0.0 - 0.2 %    Comment: Performed at [facility] Lab, [address] [place], [place] [address]  Basic metabolic panel     Status: Abnormal   Collection Time: 01/30/23  5:05 AM  Result Value Ref Range   Sodium 130 (L) 135 - 145 mmol/L   Potassium 5.9 (H) 3.5 - 5.1 mmol/L   Chloride 95 (L) 98 - 111 mmol/L   CO2 17 (L) 22 - 32 mmol/L   Glucose, Bld 85 70 - 99 mg/dL    Comment: Glucose reference range applies only to samples taken after fasting for at least 8 hours.   BUN 102 (H) 6 - 20 mg/dL   Creatinine, Ser 41.32 (H) 0.61 - 1.24 mg/dL   Calcium 8.2 (L) 8.9 - 10.3 mg/dL   GFR, Estimated 2 (L) >60 mL/min    Comment: (NOTE) Calculated using the CKD-EPI Creatinine Equation (2021)    Anion gap 18 (H) 5 - 15    Comment: Performed at [facility] Lab, [address] [place], [place] [address]  Heparin level  (unfractionated)     Status: None   Collection Time: 01/30/23  5:05 AM  Result Value Ref Range   Heparin Unfractionated 0.55 0.30 - 0.70 IU/mL    Comment: (NOTE) The clinical reportable range upper limit is being lowered to >1.10 to align with the FDA approved guidance for the current laboratory assay.  If heparin results are below expected values, and patient dosage has  been confirmed, suggest follow up testing of antithrombin III levels. Performed at [facility], [address], [place], [place] [address]   Renal function panel     Status: Abnormal   Collection Time: 01/30/23  7:22 AM  Result Value Ref Range   Sodium 131 (L) 135 - 145 mmol/L   Potassium 5.5 (H) 3.5 - 5.1 mmol/L   Chloride 96 (L) 98 - 111 mmol/L   CO2 15 (L) 22 -  32 mmol/L   Glucose, Bld 85 70 - 99 mg/dL    Comment: Glucose reference range applies only to samples taken after fasting for at least 8 hours.   BUN 107 (H) 6 - 20 mg/dL   Creatinine, Ser 16.10 (H) 0.61 - 1.24 mg/dL   Calcium 8.3 (L) 8.9 - 10.3 mg/dL   Phosphorus 6.3 (H) 2.5 - 4.6 mg/dL   Albumin 2.8 (L) 3.5 - 5.0 g/dL   GFR, Estimated 2 (L) >60 mL/min    Comment: (NOTE) Calculated using the CKD-EPI Creatinine Equation (2021)    Anion gap 20 (H) 5 - 15    Comment: Performed at [facility] Lab, [address], [place], [place] [address]  APTT     Status: Abnormal   Collection Time: 01/30/23  1:54 PM  Result Value Ref Range   aPTT 46 (H) 24 - 36 seconds    Comment:        IF BASELINE aPTT IS ELEVATED, SUGGEST PATIENT RISK ASSESSMENT BE USED TO DETERMINE APPROPRIATE ANTICOAGULANT THERAPY. Performed at [facility] Lab, [address], [place], [place] [address]     CT ABDOMEN PELVIS W CONTRAST  Result Date: 01/29/2023 CLINICAL DATA:  47 year old male with a history of renal failure, difficult access, possible transhepatic catheter. EXAM: CT ABDOMEN AND PELVIS WITH CONTRAST TECHNIQUE: Multidetector CT imaging of the abdomen and  pelvis was performed using the standard protocol following bolus administration of intravenous contrast. RADIATION DOSE REDUCTION: This exam was performed according to the departmental dose-optimization program which includes automated exposure control, adjustment of the mA and/or kV according to patient size and/or use of iterative reconstruction technique. CONTRAST:  75mL OMNIPAQUE IOHEXOL 350 MG/ML SOLN COMPARISON:  04/14/2011 FINDINGS: Lower chest: No acute finding of the lower chest. Hepatobiliary: Timing of the contrast bolus is delayed secondary to collateral venous drainage centrally. Hepatomegaly. No focal lesion. Focal fatty infiltration adjacent to the falciform ligament. Gallbladder is decompressed. Pancreas: Unremarkable Spleen: Unremarkable Adrenals/Urinary Tract: - Right adrenal gland:  Unremarkable - Left adrenal gland: Unremarkable. - Right kidney: Atrophic right kidney with nephrolithiasis. No hydronephrosis. - Left Kidney: Atrophic left kidney with nephrolithiasis. No hydronephrosis. - Urinary Bladder: Urinary bladder is decompressed. Stomach/Bowel: - Stomach: Unremarkable. - Small bowel: Unremarkable - Appendix: Normal - Colon: Colon decompressed. Colonic diverticula of the left colon. No acute inflammatory changes. Vascular/Lymphatic: Calcifications of the coronary arteries of the visualized heart. Partial opacification of collateral draining veins of the left chest. Hepatic veins not well opacified given the timing of the contrast bolus. Portal veins not well opacified given the timing of the contrast bolus. Mild atherosclerotic changes of the abdominal aorta. Mild atherosclerotic changes of the bilateral iliac arteries. Proximal femoral arteries are patent. Tunneled left femoral dialysis catheter. Multiple lymph nodes in the retroperitoneum with associated edema. 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 Surgical changes of  bilateral femoral loop grafts, with surgical changes of prior excision on the right. Reproductive: Unremarkable prostate Other: Low-density cystic structure in the abdominal wall measures 5.7 cm with some rim calcification. This is secondary to a prior peritoneal dialysis catheter. Rim calcified cystic structure within the mesentery measures 5.8 cm, likely a pseudocyst related to prior peritoneal dialysis. Partially calcified structure in the right lower pelvis, compatible with failed renal transplant. Musculoskeletal: Changes of renal osteodystrophy. No acute displaced fracture. No bony canal narrowing. Unremarkable hips. IMPRESSION: No acute CT finding. Poor opacification of the  hepatic and portal venous vasculature secondary to the timing of the contrast bolus. However, there are no secondary signs of hepatic vein occlusion identified in the liver. These above preliminary results were discussed by telephone at the time of interpretation on 01/29/2023 at 4:38 pm with Dr. Heath Lark. 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 Aortic Atherosclerosis (ICD10-I70.0). Additional ancillary findings as above. Signed, Yvone Neu. Miachel Roux, RPVI [facility] Radiology Electronically Signed   By: Gilmer Mor D.O.   On: 01/29/2023 16:39   IR Fluoro Guide CV Line Left  Result Date: 01/29/2023 INDICATION: Bacteremia. Malfunctioning left common femoral vein approach hemodialysis catheter. Patient with longstanding history of end-stage renal disease renal disease with extremely limited dialysis catheter access, currently receiving intermittently successful dialysis from a chronic left common femoral approach hemodialysis catheter, initially placed by the interventional radiology service on 04/23/2022, by report, most recently exchanged by  the vascular surgery service approximately 1 month ago. The patient presents today for fluoroscopic guided dialysis catheter exchange. EXAM: TUNNELED CENTRAL VENOUS HEMODIALYSIS CATHETER REPLACEMENT WITH FLUOROSCOPIC GUIDANCE MEDICATIONS: None CONTRAST:  25 cc Omnipaque 300 FLUOROSCOPY TIME:  367 mGy COMPLICATIONS: None immediate. PROCEDURE: Informed written consent was obtained from the patient after a discussion of the risks, benefits, and alternatives to treatment. Questions regarding the procedure were encouraged and answered. The skin and external portion of the existing hemodialysis catheter was prepped with chlorhexidine in a sterile fashion, and a sterile drape was applied covering the operative field. Maximum barrier sterile technique with sterile gowns and gloves were used for the procedure. A timeout was performed prior to the initiation of the procedure. Preprocedural spot fluoroscopic image was obtained of the left pelvis and left common femoral vein approach hemodialysis catheter. The hemodialysis catheter was noted to be located within an atypical positioned, to the left of the L5 vertebral body. Neither lumen of the pre-existing hemodialysis catheter were able to be aspirated. Pull-back venogram performed via the pre-existing hemodialysis catheter demonstrates chronic occlusion of both the IVC as well as the pelvic venous system with filling of multiple para lumbar venous collaterals. Next, two stiff glide wires were advanced through both lumens of the dialysis catheter and exchanged for a new, 23 cm tip to cuff hemodialysis catheter. After catheter exchange, neither lumen was noted to aspirate. As such, the catheter was slightly retracted to the level of the left external iliac vein where some blood return was noted. The dialysis catheter was secured in place at this location. The patient tolerated the procedure well without immediate post procedural complication. IMPRESSION: Technically successful  fluoroscopic guided exchange and repositioning of 23 cm tip to cuff tunneled left common femoral vein approach hemodialysis catheter with tip terminating within the left external iliac vein. Neither lumen of the exchanged dialysis catheter was able to be easily aspirated however the pre-existing catheter demonstrated similar functionality and reportedly has been serving as a source of intermittent successful dialysis and as such the exchanged catheter was left in place for attempted dialysis. PLAN: If this exchanged dialysis catheter does NOT serve as a durable source of successful dialysis, agree with evaluation for consideration of trans hepatic dialysis catheter placement as patient has exhausted all other options for dialysis access. Electronically Signed   By: Simonne Come M.D.   On: 01/29/2023 16:14   IR Veno/Ext/Uni Left  Result Date: 01/29/2023 INDICATION: Bacteremia.  Malfunctioning left common femoral vein approach hemodialysis catheter. Patient with longstanding history of end-stage renal disease renal disease with extremely limited dialysis catheter access, currently receiving intermittently successful dialysis from a chronic left common femoral approach hemodialysis catheter, initially placed by the interventional radiology service on 04/23/2022, by report, most recently exchanged by the vascular surgery service approximately 1 month ago. The patient presents today for fluoroscopic guided dialysis catheter exchange. EXAM: TUNNELED CENTRAL VENOUS HEMODIALYSIS CATHETER REPLACEMENT WITH FLUOROSCOPIC GUIDANCE MEDICATIONS: None CONTRAST:  25 cc Omnipaque 300 FLUOROSCOPY TIME:  367 mGy COMPLICATIONS: None immediate. PROCEDURE: Informed written consent was obtained from the patient after a discussion of the risks, benefits, and alternatives to treatment. Questions regarding the procedure were encouraged and answered. The skin and external portion of the existing hemodialysis catheter was prepped with  chlorhexidine in a sterile fashion, and a sterile drape was applied covering the operative field. Maximum barrier sterile technique with sterile gowns and gloves were used for the procedure. A timeout was performed prior to the initiation of the procedure. Preprocedural spot fluoroscopic image was obtained of the left pelvis and left common femoral vein approach hemodialysis catheter. The hemodialysis catheter was noted to be located within an atypical positioned, to the left of the L5 vertebral body. Neither lumen of the pre-existing hemodialysis catheter were able to be aspirated. Pull-back venogram performed via the pre-existing hemodialysis catheter demonstrates chronic occlusion of both the IVC as well as the pelvic venous system with filling of multiple para lumbar venous collaterals. Next, two stiff glide wires were advanced through both lumens of the dialysis catheter and exchanged for a new, 23 cm tip to cuff hemodialysis catheter. After catheter exchange, neither lumen was noted to aspirate. As such, the catheter was slightly retracted to the level of the left external iliac vein where some blood return was noted. The dialysis catheter was secured in place at this location. The patient tolerated the procedure well without immediate post procedural complication. IMPRESSION: Technically successful fluoroscopic guided exchange and repositioning of 23 cm tip to cuff tunneled left common femoral vein approach hemodialysis catheter with tip terminating within the left external iliac vein. Neither lumen of the exchanged dialysis catheter was able to be easily aspirated however the pre-existing catheter demonstrated similar functionality and reportedly has been serving as a source of intermittent successful dialysis and as such the exchanged catheter was left in place for attempted dialysis. PLAN: If this exchanged dialysis catheter does NOT serve as a durable source of successful dialysis, agree with evaluation  for consideration of trans hepatic dialysis catheter placement as patient has exhausted all other options for dialysis access. Electronically Signed   By: Simonne Come M.D.   On: 01/29/2023 16:14   MR FOOT RIGHT WO CONTRAST  Result Date: 01/29/2023 CLINICAL DATA:  Right foot infection. Great toe amputation on 12/12/2022 EXAM: MRI OF THE RIGHT FOREFOOT WITHOUT CONTRAST TECHNIQUE: Multiplanar, multisequence MR imaging of the right forefoot was performed. No intravenous contrast was administered. COMPARISON:  X-ray 01/27/2023 FINDINGS: Bones/Joint/Cartilage Postsurgical changes from transmetatarsal amputation of the first ray. There is bone marrow edema within the distal 7 mm of the residual first metatarsal diaphysis at the resection margin. No confluent low T1 signal changes at this location. The remaining bony structures of the forefoot are otherwise within normal limits. No fracture or dislocation. No additional sites of focal bone marrow edema or marrow replacement. No joint effusions. Ligaments Intact Lisfranc ligament.  Intact collateral ligaments. Muscles and Tendons Diffuse edema-like signal of  the foot musculature which may represent changes related to denervation and/or myositis. No tenosynovitis. Soft tissues Diffuse soft tissue swelling. Irregularity within the skin and superficial soft tissues at the medial aspect of the forefoot, likely along the surgical incision site. There is fluid and edema which tracks to the resection margin of the first metatarsal (series 4, image 29). There are no organized or drainable fluid collections. IMPRESSION: 1. 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 There is bone marrow edema within the distal aspect of the residual first metatarsal diaphysis at the resection margin. Appearance is most suggestive of early acute osteomyelitis given the appearance of the overlying soft tissues. 2. Fluid and edema tracks to the resection margin of the  first metatarsal. 3. No organized or drainable fluid collections. 4. Diffuse edema-like signal of the foot musculature which may represent changes related to denervation and/or myositis. Electronically Signed   By: Duanne Guess D.O.   On: 01/29/2023 14:39    Review of Systems  Constitutional:  Positive for fever and malaise/fatigue. Negative for chills.  Respiratory:  Negative for shortness of breath.   Cardiovascular:  Negative for chest pain.  Psychiatric/Behavioral:  Positive for depression.    Blood pressure 107/84, pulse 63, temperature 98 F (36.7 C), temperature source Oral, resp. rate (!) 24, height 6\' 2"  (1.88 m), weight (!) 141.8 kg, SpO2 100%.  Vitals:   01/30/23 0736 01/30/23 0807  BP: 105/69 107/84  Pulse: 72 63  Resp: 20 (!) 24  Temp: 98 F (36.7 C)   SpO2: 100% 100%    General AA&O x3. Normal mood and affect.  Vascular Right foot warm well perfused, edema present, pulses palpable  Neurologic Epicritic sensation grossly absent.  Dermatologic (Wound) Wound right foot partial first ray dehiscence with hypergranular wound bed with seropurulent drainage  Orthopedic: Motor intact BLE.    Assessment/Plan:  Osteomyelitis right foot -Imaging: Studies independently reviewed. Agree w/ interpretation that it does appear to have a small abscess deep to the granular tissue and recurrent osteomyelitis -Antibiotics: would maintain broad spectrum abx for now -I discussed results of the MRI with him that there is new infection, suspect contiguous extension w/ poor wound healing. I discussed revision of the partial first ray and debridement. When I ask him about this he says "it doesn't matter I'm just slowly dying anyway". He does not indicate one way or another if he would like to proceed with surgery yet -Would like to discuss further w/ him tomorrow. Potential surgery Sunday -Given his many chronic conditions and prognosis at a young age he is understandably depressed. May be  beneficial to involve psych/BH and/or social support from [facility] or SW. -Will follow  Edwin Cap 01/30/2023, 5:38 PM   Best available via secure chat for questions or concerns.

## 2023-01-30 NOTE — Progress Notes (Signed)
Chief Complaint: Patient was seen in consultation today for non-functioning dialysis catheter  Referring Physician(s): Dr.   Bo Mcclintock Physician: Gilmer Mor  Patient Status: [facility] - In-pt  History of Present Illness: [name] is a 47 y.o. male with past medical history of ESRD on HD, HTN, MO, admitted with non-functioning dialysis catheter. Patient with extensive history of poorly functioning catheters now with L femoral groin catheter which was last exchanged by Dr. Grace Isaac yesterday 01/29/23.  Unfortunately, catheter is not functioning this AM and his SCr remains significantly elevated.  IR consulted for reassessment of his HD catheter.  Patient did discuss several options for last resort dialysis access in the absence of being able to obtain central access including transhepatic dialysis catheter placement with Dr. Elmon Kirschner yesterday.  Given the inability to successfully dialysis this AM, Nephrology recommending proceeding with transhepatic dialysis catheter placement. Case reviewed by Dr. Loreta Ave who is agreeable.   Past Medical History:  Diagnosis Date   Acute respiratory failure with hypoxia (HCC) 11/30/2018   Anemia    ESRD   ESRD on hemodialysis (HCC) 06/07/2011   TTS [place]. Started HD in 2006, got transplant in 2014 lasted until Dec 2019 then went back on HD.  Has L thigh AVG as of Jun 2020.  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    History of blood transfusion    Hypertension    03/19/22- has not had high blood pressure in 5 years.   Morbid obesity (HCC)    Paroxysmal atrial fibrillation (HCC)    Pre-diabetes    no meds   Sepsis (HCC) 11/30/2018   Sleep apnea    lost weight, does not use CPAP    Past Surgical History:  Procedure Laterality Date   AMPUTATION Right 12/12/2022   Procedure: AMPUTATION RAY;  Surgeon: Louann Sjogren, DPM;  Location: [facility] OR;  Service: Orthopedics/Podiatry;  Laterality: Right;  surgical team to do local block    ARTERIOVENOUS GRAFT PLACEMENT  08/09/2010   Left Thigh Graft by Dr. Cari Caraway   AV FISTULA PLACEMENT Right 05/18/2018   Procedure: INSERTION OF ARTERIOVENOUS (AV) GORE-TEX GRAFT Left THIGH;  Surgeon: Maeola Harman, MD;  Location: [facility] OR;  Service: Vascular;  Laterality: Right;   AV FISTULA PLACEMENT Right 02/15/2021   Procedure: INSERTION OF ARTERIOVENOUS (AV) GORE-TEX LOOP GRAFT RIGHT THIGH;  Surgeon: Maeola Harman, MD;  Location: [facility] OR;  Service: Vascular;  Laterality: Right;   BIOPSY  05/20/2022   Procedure: BIOPSY;  Surgeon: Shellia Cleverly, DO;  Location: [facility];  Service: Gastroenterology;;   CAPD INSERTION N/A 06/13/2022   Procedure: LAPAROSCOPIC INSERTION CONTINUOUS AMBULATORY PERITONEAL DIALYSIS  (CAPD) CATHETER;  Surgeon: Maeola Harman, MD;  Location: [facility] OR;  Service: Vascular;  Laterality: N/A;   CAPD REMOVAL  05/08/2011   Procedure: CONTINUOUS AMBULATORY PERITONEAL DIALYSIS  (CAPD) CATHETER REMOVAL;  Surgeon: Iona Coach, MD;  Location: [facility] OR;  Service: General;  Laterality: N/A;  Removal of CAPD catheter, Dr. requests to go after 100   CAPD REMOVAL N/A 08/08/2022   Procedure: REMOVAL CONTINUOUS AMBULATORY PERITONEAL DIALYSIS  (CAPD) CATHETER;  Surgeon: Maeola Harman, MD;  Location: [facility] OR;  Service: Vascular;  Laterality: N/A;   COLONOSCOPY WITH PROPOFOL N/A 05/20/2022   Procedure: COLONOSCOPY WITH PROPOFOL;  Surgeon: Shellia Cleverly, DO;  Location: [facility];  Service: Gastroenterology;  Laterality: N/A;   I & D EXTREMITY Right 12/08/2022   Procedure: IRRIGATION AND DEBRIDEMENT RIGHT FOOT;  Surgeon: Louann Sjogren, DPM;  Location: [facility] OR;  Service: Orthopedics/Podiatry;  Laterality: Right;   INSERTION OF DIALYSIS CATHETER  10/05/2010   Right Femoral Cath insertion by Dr. Leonides Sake.  Pt ahas had several caths inserted.   INSERTION OF DIALYSIS CATHETER Right 02/15/2021   Procedure: Attempted INSERTION OF RIGHT and Left  Internal Jugular DIALYSIS CATHETER, Insertion of Left Femoral Vein Dialysis Catheter;  Surgeon: Maeola Harman, MD;  Location: [facility] OR;  Service: Vascular;  Laterality: Right;   INSERTION OF DIALYSIS CATHETER Right 04/26/2021   Procedure: INSERTION OF TUNNELED 55cm PALIDROME PRECISION CHRONIC DIALYSIS CATHETER;  Surgeon: Leonie Douglas, MD;  Location: [facility] OR;  Service: Vascular;  Laterality: Right;   INSERTION OF DIALYSIS CATHETER Left 12/26/2021   Procedure: INSERTION OF TUNNELED  DIALYSIS CATHETER LEFT FEMORAL ARTERY;  Surgeon: Maeola Harman, MD;  Location: [facility] OR;  Service: Vascular;  Laterality: Left;   INSERTION OF DIALYSIS CATHETER Left 03/06/2022   Procedure: INSERTION OF DIALYSIS CATHETER;  Surgeon: Maeola Harman, MD;  Location: [facility] OR;  Service: Vascular;  Laterality: Left;   INSERTION OF DIALYSIS CATHETER Left 12/31/2022   Procedure: INSERTION OF DIALYSIS CATHETER LEFT GROIN;  Surgeon: Leonie Douglas, MD;  Location: [facility] OR;  Service: Vascular;  Laterality: Left;   IR FLUORO GUIDE CV LINE LEFT  04/23/2022   IR FLUORO GUIDE CV LINE LEFT  01/29/2023   IR FLUORO GUIDE CV LINE RIGHT  05/11/2018   IR FLUORO GUIDE CV LINE RIGHT  07/03/2021   IR FLUORO GUIDE CV LINE RIGHT  07/16/2021   IR PTA ADDL CENTRAL DIALYSIS SEG THRU DIALY CIRCUIT RIGHT Right 07/03/2021   IR US GUIDE VASC ACCESS LEFT  04/23/2022   IR US GUIDE VASC ACCESS RIGHT  05/11/2018   IR VENO/EXT/UNI LEFT  04/23/2022   IR VENO/EXT/UNI LEFT  01/29/2023   IR VENOCAVAGRAM IVC  07/16/2021   KIDNEY TRANSPLANT  2014   KNEE ARTHROSCOPY Left    LAPAROSCOPIC LYSIS OF ADHESIONS N/A 06/13/2022   Procedure: LAPAROSCOPIC LYSIS OF ADHESIONS;  Surgeon: Maeola Harman, MD;  Location: [facility] OR;  Service: Vascular;  Laterality: N/A;   POLYPECTOMY  05/20/2022   Procedure: POLYPECTOMY;  Surgeon: Shellia Cleverly, DO;  Location: [facility];  Service: Gastroenterology;;   THROMBECTOMY W/ EMBOLECTOMY Right 04/26/2021    Procedure: REMOVAL OF RIGHT THIGH ARTERIOVENOUS GORE-TEX GRAFT AND REPAIR OF RIGHT COMMON FEMORAL ARTERY;  Surgeon: Leonie Douglas, MD;  Location: [facility] OR;  Service: Vascular;  Laterality: Right;   UPPER EXTREMITY ANGIOGRAPHY Bilateral 05/13/2018   Procedure: UPPER EXTREMITY ANGIOGRAPHY - bilarteral;  Surgeon: Cephus Shelling, MD;  Location: [facility];  Service: Cardiovascular;  Laterality: Bilateral;    Allergies: Patient has no known allergies.  Medications: Prior to Admission medications   Medication Sig Start Date End Date Taking? Authorizing Provider  apixaban (ELIQUIS) 5 MG TABS tablet Take 5 mg by mouth 2 (two) times daily. 10/10/21  Yes [provider]  atorvastatin (LIPITOR) 40 MG tablet Take 1 tablet (40 mg total) by mouth daily. 12/20/21  Yes [name], MD  B Complex-C-Zn-Folic Acid (DIALYVITE/ZINC) TABS Take 1 tablet by mouth daily. 01/16/22  Yes [provider]  Calcium Carb-Cholecalciferol (CALCIUM 600 + D PO) Take 2 tablets by mouth daily.   Yes [provider]  ferric citrate (AURYXIA) 1 GM 210 MG(Fe) tablet Take 210 mg by mouth 3 (three) times daily with meals. 09/06/19  Yes [provider]  midodrine (  PROAMATINE) 5 MG tablet Take 15 mg by mouth 3 (three) times daily. 01/20/23  Yes [provider]     Family History  Problem Relation Age of Onset   Diabetes Father    Stroke Father    Stroke Maternal Grandmother    Anesthesia problems Neg Hx     Social History   Socioeconomic History   Marital status: Single    Spouse name: Not on file   Number of children: 0   Years of education: Not on file   Highest education level: Not on file  Occupational History   Not on file  Tobacco Use   Smoking status: Former    Current packs/day: 0.00    Types: Cigarettes    Quit date: 11/08/2015    Years since quitting: 7.2   Smokeless tobacco: Never  Vaping Use   Vaping status: Never Used  Substance and Sexual  Activity   Alcohol use: Not Currently   Drug use: Not Currently    Types: Marijuana    Comment: Last use was in 04/2022   Sexual activity: Yes  Other Topics Concern   Not on file  Social History Narrative   Lives alone.    Social Determinants of Health   Financial Resource Strain: Low Risk  (12/29/2022)   Overall Financial Resource Strain (CARDIA)    Difficulty of Paying Living Expenses: Not hard at all  Food Insecurity: No Food Insecurity (01/28/2023)   Hunger Vital Sign    Worried About Running Out of Food in the Last Year: Never true    Ran Out of Food in the Last Year: Never true  Transportation Needs: Unmet Transportation Needs (12/31/2022)   PRAPARE - Administrator, Civil Service (Medical): Yes    Lack of Transportation (Non-Medical): No  Physical Activity: Insufficiently Active (12/29/2022)   Exercise Vital Sign    Days of Exercise per Week: 3 days    Minutes of Exercise per Session: 30 min  Stress: No Stress Concern Present (12/29/2022)   [employer] of Occupational Health - Occupational Stress Questionnaire    Feeling of Stress : Not at all  Social Connections: Socially Isolated (12/29/2022)   Social Connection and Isolation Panel [NHANES]    Frequency of Communication with Friends and Family: More than three times a week    Frequency of Social Gatherings with Friends and Family: More than three times a week    Attends Religious Services: Never    Database administrator or Organizations: No    Attends Banker Meetings: Never    Marital Status: Never married     Review of Systems: A 12 point ROS discussed and pertinent positives are indicated in the HPI above.  All other systems are negative.  Review of Systems  Constitutional:  Negative for fatigue and fever.  Respiratory:  Negative for cough and shortness of breath.   Cardiovascular:  Negative for chest pain.  Gastrointestinal:  Negative for abdominal pain, diarrhea, nausea and  vomiting.  Musculoskeletal:  Negative for back pain.  Psychiatric/Behavioral:  Negative for behavioral problems and confusion.     Vital Signs: BP 107/84   Pulse 63   Temp 98 F (36.7 C) (Oral)   Resp (!) 24   Ht 6\' 2"  (1.88 m)   Wt (!) 312 lb 9.8 oz (141.8 kg) Comment: bed  SpO2 100%   BMI 40.14 kg/m   Physical Exam Vitals and nursing note reviewed.  Constitutional:  General: He is not in acute distress.    Appearance: Normal appearance. He is not ill-appearing.  HENT:     Mouth/Throat:     Mouth: Mucous membranes are moist.     Pharynx: Oropharynx is clear.  Cardiovascular:     Rate and Rhythm: Normal rate and regular rhythm.  Pulmonary:     Effort: Pulmonary effort is normal.     Breath sounds: Normal breath sounds.  Abdominal:     General: Abdomen is flat. There is no distension.     Palpations: Abdomen is soft.     Tenderness: There is no abdominal tenderness.  Musculoskeletal:        General: Normal range of motion.  Skin:    General: Skin is warm and dry.     Comments: L femoral dialysis catheter  Neurological:     General: No focal deficit present.     Mental Status: He is alert and oriented to person, place, and time.  Psychiatric:        Mood and Affect: Mood normal.        Behavior: Behavior normal.        Thought Content: Thought content normal.        Judgment: Judgment normal.      MD Evaluation Airway: WNL Heart: WNL Abdomen: WNL Chest/ Lungs: WNL ASA  Classification: 3 Mallampati/Airway Score: Two   Imaging: CT ABDOMEN PELVIS W CONTRAST  Result Date: 01/29/2023 CLINICAL DATA:  47 year old male with a history of renal failure, difficult access, possible transhepatic catheter. EXAM: CT ABDOMEN AND PELVIS WITH CONTRAST TECHNIQUE: Multidetector CT imaging of the abdomen and pelvis was performed using the standard protocol following bolus administration of intravenous contrast. RADIATION DOSE REDUCTION: This exam was performed according  to the departmental dose-optimization program which includes automated exposure control, adjustment of the mA and/or kV according to patient size and/or use of iterative reconstruction technique. CONTRAST:  75mL OMNIPAQUE IOHEXOL 350 MG/ML SOLN COMPARISON:  04/14/2011 FINDINGS: Lower chest: No acute finding of the lower chest. Hepatobiliary: Timing of the contrast bolus is delayed secondary to collateral venous drainage centrally. Hepatomegaly. No focal lesion. Focal fatty infiltration adjacent to the falciform ligament. Gallbladder is decompressed. Pancreas: Unremarkable Spleen: Unremarkable Adrenals/Urinary Tract: - Right adrenal gland:  Unremarkable - Left adrenal gland: Unremarkable. - Right kidney: Atrophic right kidney with nephrolithiasis. No hydronephrosis. - Left Kidney: Atrophic left kidney with nephrolithiasis. No hydronephrosis. - Urinary Bladder: Urinary bladder is decompressed. Stomach/Bowel: - Stomach: Unremarkable. - Small bowel: Unremarkable - Appendix: Normal - Colon: Colon decompressed. Colonic diverticula of the left colon. No acute inflammatory changes. Vascular/Lymphatic: Calcifications of the coronary arteries of the visualized heart. Partial opacification of collateral draining veins of the left chest. Hepatic veins not well opacified given the timing of the contrast bolus. Portal veins not well opacified given the timing of the contrast bolus. Mild atherosclerotic changes of the abdominal aorta. Mild atherosclerotic changes of the bilateral iliac arteries. Proximal femoral arteries are patent. Tunneled left femoral dialysis catheter. Multiple lymph nodes in the retroperitoneum with associated edema. 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 Surgical changes of bilateral femoral loop grafts, with surgical changes of prior excision on the right. Reproductive: Unremarkable prostate Other: Low-density cystic structure in the  abdominal wall measures 5.7 cm with some rim calcification. This is secondary to a prior peritoneal dialysis catheter. Rim calcified cystic structure within the mesentery measures 5.8 cm, likely a  pseudocyst related to prior peritoneal dialysis. Partially calcified structure in the right lower pelvis, compatible with failed renal transplant. Musculoskeletal: Changes of renal osteodystrophy. No acute displaced fracture. No bony canal narrowing. Unremarkable hips. IMPRESSION: No acute CT finding. Poor opacification of the hepatic and portal venous vasculature secondary to the timing of the contrast bolus. However, there are no secondary signs of hepatic vein occlusion identified in the liver. These above preliminary results were discussed by telephone at the time of interpretation on 01/29/2023 at 4:38 pm with Dr. Heath Lark. 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 Aortic Atherosclerosis (ICD10-I70.0). Additional ancillary findings as above. Signed, Yvone Neu. Miachel Roux, RPVI [facility] Electronically Signed   By: Gilmer Mor D.O.   On: 01/29/2023 16:39   IR Fluoro Guide CV Line Left  Result Date: 01/29/2023 INDICATION: Bacteremia. Malfunctioning left common femoral vein approach hemodialysis catheter. Patient with longstanding history of end-stage renal disease renal disease with extremely limited dialysis catheter access, currently receiving intermittently successful dialysis from a chronic left common femoral approach hemodialysis catheter, initially placed by the interventional radiology service on 04/23/2022, by report, most recently exchanged by the vascular surgery service approximately 1 month ago. The patient presents today for fluoroscopic guided dialysis catheter exchange. EXAM: TUNNELED CENTRAL VENOUS  HEMODIALYSIS CATHETER REPLACEMENT WITH FLUOROSCOPIC GUIDANCE MEDICATIONS: None CONTRAST:  25 cc Omnipaque 300 FLUOROSCOPY TIME:  367 mGy COMPLICATIONS: None immediate. PROCEDURE: Informed written consent was obtained from the patient after a discussion of the risks, benefits, and alternatives to treatment. Questions regarding the procedure were encouraged and answered. The skin and external portion of the existing hemodialysis catheter was prepped with chlorhexidine in a sterile fashion, and a sterile drape was applied covering the operative field. Maximum barrier sterile technique with sterile gowns and gloves were used for the procedure. A timeout was performed prior to the initiation of the procedure. Preprocedural spot fluoroscopic image was obtained of the left pelvis and left common femoral vein approach hemodialysis catheter. The hemodialysis catheter was noted to be located within an atypical positioned, to the left of the L5 vertebral body. Neither lumen of the pre-existing hemodialysis catheter were able to be aspirated. Pull-back venogram performed via the pre-existing hemodialysis catheter demonstrates chronic occlusion of both the IVC as well as the pelvic venous system with filling of multiple para lumbar venous collaterals. Next, two stiff glide wires were advanced through both lumens of the dialysis catheter and exchanged for a new, 23 cm tip to cuff hemodialysis catheter. After catheter exchange, neither lumen was noted to aspirate. As such, the catheter was slightly retracted to the level of the left external iliac vein where some blood return was noted. The dialysis catheter was secured in place at this location. The patient tolerated the procedure well without immediate post procedural complication. IMPRESSION: Technically successful fluoroscopic guided exchange and repositioning of 23 cm tip to cuff tunneled left common femoral vein approach hemodialysis catheter with tip terminating within the  left external iliac vein. Neither lumen of the exchanged dialysis catheter was able to be easily aspirated however the pre-existing catheter demonstrated similar functionality and reportedly has been serving as a source of intermittent successful dialysis and as such the exchanged catheter was left in place for attempted dialysis. PLAN: If this exchanged dialysis catheter does NOT serve as a durable source of successful dialysis, agree with evaluation  for consideration of trans hepatic dialysis catheter placement as patient has exhausted all other options for dialysis access. Electronically Signed   By: Simonne Come M.D.   On: 01/29/2023 16:14   IR Veno/Ext/Uni Left  Result Date: 01/29/2023 INDICATION: Bacteremia. Malfunctioning left common femoral vein approach hemodialysis catheter. Patient with longstanding history of end-stage renal disease renal disease with extremely limited dialysis catheter access, currently receiving intermittently successful dialysis from a chronic left common femoral approach hemodialysis catheter, initially placed by the interventional radiology service on 04/23/2022, by report, most recently exchanged by the vascular surgery service approximately 1 month ago. The patient presents today for fluoroscopic guided dialysis catheter exchange. EXAM: TUNNELED CENTRAL VENOUS HEMODIALYSIS CATHETER REPLACEMENT WITH FLUOROSCOPIC GUIDANCE MEDICATIONS: None CONTRAST:  25 cc Omnipaque 300 FLUOROSCOPY TIME:  367 mGy COMPLICATIONS: None immediate. PROCEDURE: Informed written consent was obtained from the patient after a discussion of the risks, benefits, and alternatives to treatment. Questions regarding the procedure were encouraged and answered. The skin and external portion of the existing hemodialysis catheter was prepped with chlorhexidine in a sterile fashion, and a sterile drape was applied covering the operative field. Maximum barrier sterile technique with sterile gowns and gloves were used  for the procedure. A timeout was performed prior to the initiation of the procedure. Preprocedural spot fluoroscopic image was obtained of the left pelvis and left common femoral vein approach hemodialysis catheter. The hemodialysis catheter was noted to be located within an atypical positioned, to the left of the L5 vertebral body. Neither lumen of the pre-existing hemodialysis catheter were able to be aspirated. Pull-back venogram performed via the pre-existing hemodialysis catheter demonstrates chronic occlusion of both the IVC as well as the pelvic venous system with filling of multiple para lumbar venous collaterals. Next, two stiff glide wires were advanced through both lumens of the dialysis catheter and exchanged for a new, 23 cm tip to cuff hemodialysis catheter. After catheter exchange, neither lumen was noted to aspirate. As such, the catheter was slightly retracted to the level of the left external iliac vein where some blood return was noted. The dialysis catheter was secured in place at this location. The patient tolerated the procedure well without immediate post procedural complication. IMPRESSION: Technically successful fluoroscopic guided exchange and repositioning of 23 cm tip to cuff tunneled left common femoral vein approach hemodialysis catheter with tip terminating within the left external iliac vein. Neither lumen of the exchanged dialysis catheter was able to be easily aspirated however the pre-existing catheter demonstrated similar functionality and reportedly has been serving as a source of intermittent successful dialysis and as such the exchanged catheter was left in place for attempted dialysis. PLAN: If this exchanged dialysis catheter does NOT serve as a durable source of successful dialysis, agree with evaluation for consideration of trans hepatic dialysis catheter placement as patient has exhausted all other options for dialysis access. Electronically Signed   By: Simonne Come M.D.    On: 01/29/2023 16:14   MR FOOT RIGHT WO CONTRAST  Result Date: 01/29/2023 CLINICAL DATA:  Right foot infection. Great toe amputation on 12/12/2022 EXAM: MRI OF THE RIGHT FOREFOOT WITHOUT CONTRAST TECHNIQUE: Multiplanar, multisequence MR imaging of the right forefoot was performed. No intravenous contrast was administered. COMPARISON:  X-ray 01/27/2023 FINDINGS: Bones/Joint/Cartilage Postsurgical changes from transmetatarsal amputation of the first ray. There is bone marrow edema within the distal 7 mm of the residual first metatarsal diaphysis at the resection margin. No confluent low T1 signal changes at this location. The remaining  bony structures of the forefoot are otherwise within normal limits. No fracture or dislocation. No additional sites of focal bone marrow edema or marrow replacement. No joint effusions. Ligaments Intact Lisfranc ligament.  Intact collateral ligaments. Muscles and Tendons Diffuse edema-like signal of the foot musculature which may represent changes related to denervation and/or myositis. No tenosynovitis. Soft tissues Diffuse soft tissue swelling. Irregularity within the skin and superficial soft tissues at the medial aspect of the forefoot, likely along the surgical incision site. There is fluid and edema which tracks to the resection margin of the first metatarsal (series 4, image 29). There are no organized or drainable fluid collections. IMPRESSION: 1. 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 There is bone marrow edema within the distal aspect of the residual first metatarsal diaphysis at the resection margin. Appearance is most suggestive of early acute osteomyelitis given the appearance of the overlying soft tissues. 2. Fluid and edema tracks to the resection margin of the first metatarsal. 3. No organized or drainable fluid collections. 4. Diffuse edema-like signal of the foot musculature which may represent changes related to denervation and/or  myositis. Electronically Signed   By: Duanne Guess D.O.   On: 01/29/2023 14:39   DG Foot Complete Right  Result Date: 01/27/2023 CLINICAL DATA:  Osteomyelitis. Status post amputation. Possible sepsis. EXAM: RIGHT FOOT COMPLETE - 3+ VIEW COMPARISON:  Right foot radiographs 12/12/2022 and 12/08/2022 FINDINGS: Redemonstration of amputation of the great toe to the mid metatarsal shaft. Within the limitations of recent surgery, no definite new cortical erosion is seen at the distal amputation site. There is soft tissue swelling at the amputation site. Interval decrease in air at the distal amputation site compared to immediate postoperative radiographs 12/12/2022. Moderate plantar calcaneal heel spur. No acute fracture or dislocation. IMPRESSION: Redemonstration of amputation of the great toe to the mid metatarsal shaft. Within the limitations of recent surgery, no definite new cortical erosion is seen at the distal amputation site. Electronically Signed   By: Neita Garnet M.D.   On: 01/27/2023 14:51   DG Chest Portable 1 View  Result Date: 01/27/2023 CLINICAL DATA:  Sepsis.  Toe amputation.  Possible sepsis. EXAM: PORTABLE CHEST 1 VIEW COMPARISON:  Chest radiographs 02/15/2021, 12/06/2022, 12/07/2022 FINDINGS: Moderately decreased lung volumes. Cardiac silhouette and mediastinal contours are within normal limits for technique and low lung volumes. No focal airspace opacity is seen. No pulmonary edema, pleural effusion, or pneumothorax. No acute skeletal abnormality. IMPRESSION: Low lung volumes. No pneumonia is identified. Electronically Signed   By: Neita Garnet M.D.   On: 01/27/2023 14:46   DG C-Arm 1-60 Min-No Report  Result Date: 12/31/2022 Fluoroscopy was utilized by the requesting physician.  No radiographic interpretation.    Labs:  CBC: Recent Labs    01/27/23 1114 01/28/23 0630 01/29/23 0833 01/30/23 0505  WBC 5.6 9.2 9.4 11.1*  HGB 8.5* 9.5* 8.0* 8.2*  HCT 26.8* 29.9* 24.9* 25.5*   PLT 214 227 271 303    COAGS: Recent Labs    12/31/22 1809 01/27/23 1523 01/28/23 2054 01/29/23 0833 01/30/23 0505  INR  --  1.6*  --   --   --   APTT 42*  --  45* 44* 43*    BMP: Recent Labs    01/27/23 2140 01/28/23 0630 01/30/23 0505 01/30/23 0722  NA 132* 130* 130* 131*  K 5.5* 5.9* 5.9* 5.5*  CL 95* 94* 95* 96*  CO2 21* 16* 17* 15*  GLUCOSE 106* 103* 85  85  BUN 92* 99* 102* 107*  CALCIUM 8.4* 8.6* 8.2* 8.3*  CREATININE 20.17* 19.76* 20.48* 20.75*  GFRNONAA 3* 3* 2* 2*    LIVER FUNCTION TESTS: Recent Labs    12/06/22 1053 12/11/22 0915 12/31/22 1302 01/27/23 1114 01/28/23 0630 01/30/23 0722  BILITOT 1.1  --  0.7 0.8 1.2  --   AST 26  --  11* 50* 34  --   ALT 31  --  9 53* 46*  --   ALKPHOS 58  --  56 149* 120  --   PROT 8.5*  --  7.5 7.5 7.2  --   ALBUMIN 3.8   < > 3.6 2.9* 2.9* 2.8*   < > = values in this interval not displayed.    TUMOR MARKERS: No results for input(s): "AFPTM", "CEA", "CA199", "CHROMGRNA" in the last 8760 hours.  Assessment and Plan: Non-functioning HD catheter Patient without successful dialysis this AM.  Prior discussions held with IR regarding transhepatic catheter placement.   Revisited with patient this afternoon to discuss.  He is agreeable to transhepatic access.   Patient also consented for venogram with possible intervention to re-establish his groin access.  This has unsuccessfully been attempted in the past, however given his now limited options preservation of any and all viable venous access can be explored if patient is agreeable.   Risks and benefits discussed with the patient including, but not limited to bleeding, infection, vascular injury, pneumothorax which may require chest tube placement, air embolism or even death  All of the patient's questions were answered, patient is agreeable to proceed. Consent signed and in chart.   Loyce Dys, MS RD PA-C     Thank you for this interesting consult.  I  greatly enjoyed meeting [name] and look forward to participating in their care.  A copy of this report was sent to the requesting provider on this date.  Electronically Signed: Hoyt Koch, PA 01/30/2023, 12:40 PM   I spent a total of 40 Minutes    in face to face in clinical consultation, greater than 50% of which was counseling/coordinating care for non-functioning dialysis catheter, ESRD

## 2023-01-30 NOTE — Progress Notes (Signed)
Subjective: Seen and examined in room, no current complaints, no shortness of breath, unfortunately femoral catheter did not work dialysis today, radiology called to proceed with transhepatic HD catheter insertion.  Objective Vital signs in last 24 hours: Vitals:   01/30/23 0736 01/30/23 0757 01/30/23 0807 01/30/23 0847  BP: 105/69  107/84   Pulse: 72  63   Resp: 20  (!) 24   Temp: 98 F (36.7 C)     TempSrc: Oral     SpO2: 100%  100%   Weight:  (!) 141.8 kg  (!) 141.8 kg  Height:       Weight change:   Physical Exam: General: Alert obese adult male NAD room air Heart: RRR no MRG Lungs: CTA bilaterally nonlabored breathing Abdomen: NABS, obese, soft, ND, NT Extremities: No bipedal edema Dialysis Access: Left femoral TDC dressing dry clean  OP dialysis Orders: TTS at [facility] 4.5hr, 500/500, EDW 137kg, 2K/2Ca bath, TDC L femoral, heparin 5000 initial + 2500 mid run bolus - Parsabiv 7.5mg  IV q HD - Mircera IV q 2 weeks - last 8/6   Assessment/Plan: Proteus bacteremia: Blood Cx 8/20 (2 of 4 positive?), repeat 8/22 pending. On Cefepime. Source = TDC or R foot wound. Old TDC removed, new one replaced 8/22 with IR. Chronic R foot wound: S/p R toe amputation 7/5, not healing. Possible early osteo on imaging - Vanc, Flagyl being added per ID. ESRD: TTS OP schedule , but had short-HD on Tuesday 8/20, so dialyzed again here 8/21 in preparation for possible line 8/22. Technically due on Saturday -had plans for HD today but left femoral catheter did not work K5.5<5.9 we will give [facility] Dialysis access: Running out of options unfortunately, has been on HD ~20 years. Known central venous disease/SVC stenosis, has been followed by [facility]=plans for HeRO graft in the near future - of course that will be on hold until infection cleared. IR is evaluating him for possible transhepatic catheter.  New L femoral TDC 8/22.  Does not work Hypotension/volume: Chronic  low BP on midodrine 15mg  TID, up per scale but seems euvolemic on exam. Anemia of ESRD: Hgb 8.2  - due for ESA, Aranesp 150 mg given Clovis Cao 8/22 Secondary HTPH: CorrCa ok, Phos 6.3 on Auryxia as binder  Parsabiv not formulary here. Nutrition: Alb 2.9 , adding supps. pAF: Eliquis on hold for now OSA Adjustment disorder: Mood is very low  very worried about the future.  Lenny Pastel, PA-C [facility] [phone] 01/30/2023,1:26 PM  LOS: 3 days   Labs: Basic Metabolic Panel: Recent Labs  Lab 01/28/23 0630 01/30/23 0505 01/30/23 0722  NA 130* 130* 131*  K 5.9* 5.9* 5.5*  CL 94* 95* 96*  CO2 16* 17* 15*  GLUCOSE 103* 85 85  BUN 99* 102* 107*  CREATININE 19.76* 20.48* 20.75*  CALCIUM 8.6* 8.2* 8.3*  PHOS  --   --  6.3*   Liver Function Tests: Recent Labs  Lab 01/27/23 1114 01/28/23 0630 01/30/23 0722  AST 50* 34  --   ALT 53* 46*  --   ALKPHOS 149* 120  --   BILITOT 0.8 1.2  --   PROT 7.5 7.2  --   ALBUMIN 2.9* 2.9* 2.8*   No results for input(s): "LIPASE", "AMYLASE" in the last 168 hours. No results for input(s): "AMMONIA" in the last 168 hours. CBC: Recent Labs  Lab 01/27/23 1114 01/28/23 0630 01/29/23 0833 01/30/23 0505  WBC 5.6 9.2 9.4 11.1*  NEUTROABS 4.4  --   --   --   HGB 8.5* 9.5* 8.0* 8.2*  HCT 26.8* 29.9* 24.9* 25.5*  MCV 86.7 88.7 86.2 84.7  PLT 214 227 271 303   Cardiac Enzymes: No results for input(s): "CKTOTAL", "CKMB", "CKMBINDEX", "TROPONINI" in the last 168 hours. CBG: Recent Labs  Lab 01/27/23 2340 01/28/23 0352 01/28/23 0739 01/28/23 1129 01/28/23 2104  GLUCAP 83 74 102* 103* 85    Studies/Results: CT ABDOMEN PELVIS W CONTRAST  Result Date: 01/29/2023 CLINICAL DATA:  47 year old male with a history of renal failure, difficult access, possible transhepatic catheter. EXAM: CT ABDOMEN AND PELVIS WITH CONTRAST TECHNIQUE: Multidetector CT imaging of the abdomen and pelvis was performed using the standard protocol  following bolus administration of intravenous contrast. RADIATION DOSE REDUCTION: This exam was performed according to the departmental dose-optimization program which includes automated exposure control, adjustment of the mA and/or kV according to patient size and/or use of iterative reconstruction technique. CONTRAST:  75mL OMNIPAQUE IOHEXOL 350 MG/ML SOLN COMPARISON:  04/14/2011 FINDINGS: Lower chest: No acute finding of the lower chest. Hepatobiliary: Timing of the contrast bolus is delayed secondary to collateral venous drainage centrally. Hepatomegaly. No focal lesion. Focal fatty infiltration adjacent to the falciform ligament. Gallbladder is decompressed. Pancreas: Unremarkable Spleen: Unremarkable Adrenals/Urinary Tract: - Right adrenal gland:  Unremarkable - Left adrenal gland: Unremarkable. - Right kidney: Atrophic right kidney with nephrolithiasis. No hydronephrosis. - Left Kidney: Atrophic left kidney with nephrolithiasis. No hydronephrosis. - Urinary Bladder: Urinary bladder is decompressed. Stomach/Bowel: - Stomach: Unremarkable. - Small bowel: Unremarkable - Appendix: Normal - Colon: Colon decompressed. Colonic diverticula of the left colon. No acute inflammatory changes. Vascular/Lymphatic: Calcifications of the coronary arteries of the visualized heart. Partial opacification of collateral draining veins of the left chest. Hepatic veins not well opacified given the timing of the contrast bolus. Portal veins not well opacified given the timing of the contrast bolus. Mild atherosclerotic changes of the abdominal aorta. Mild atherosclerotic changes of the bilateral iliac arteries. Proximal femoral arteries are patent. Tunneled left femoral dialysis catheter. Multiple lymph nodes in the retroperitoneum with associated edema. 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 Surgical changes of bilateral femoral loop grafts, with surgical  changes of prior excision on the right. Reproductive: Unremarkable prostate Other: Low-density cystic structure in the abdominal wall measures 5.7 cm with some rim calcification. This is secondary to a prior peritoneal dialysis catheter. Rim calcified cystic structure within the mesentery measures 5.8 cm, likely a pseudocyst related to prior peritoneal dialysis. Partially calcified structure in the right lower pelvis, compatible with failed renal transplant. Musculoskeletal: Changes of renal osteodystrophy. No acute displaced fracture. No bony canal narrowing. Unremarkable hips. IMPRESSION: No acute CT finding. Poor opacification of the hepatic and portal venous vasculature secondary to the timing of the contrast bolus. However, there are no secondary signs of hepatic vein occlusion identified in the liver. These above preliminary results were discussed by telephone at the time of interpretation on 01/29/2023 at 4:38 pm with Dr. Heath Lark. 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 Aortic Atherosclerosis (ICD10-I70.0). Additional ancillary findings as above. Signed, Yvone Neu. Miachel Roux, RPVI [facility] Electronically Signed   By: Gilmer Mor D.O.   On: 01/29/2023 16:39   IR Fluoro Guide CV Line Left  Result Date: 01/29/2023 INDICATION: Bacteremia. Malfunctioning left common femoral vein approach hemodialysis catheter. Patient with longstanding history of end-stage renal disease renal disease with extremely limited dialysis catheter access, currently receiving intermittently successful dialysis from a chronic left common femoral approach hemodialysis catheter, initially placed by the interventional radiology service on 04/23/2022, by report, most recently exchanged by the vascular surgery service approximately 1  month ago. The patient presents today for fluoroscopic guided dialysis catheter exchange. EXAM: TUNNELED CENTRAL VENOUS HEMODIALYSIS CATHETER REPLACEMENT WITH FLUOROSCOPIC GUIDANCE MEDICATIONS: None CONTRAST:  25 cc Omnipaque 300 FLUOROSCOPY TIME:  367 mGy COMPLICATIONS: None immediate. PROCEDURE: Informed written consent was obtained from the patient after a discussion of the risks, benefits, and alternatives to treatment. Questions regarding the procedure were encouraged and answered. The skin and external portion of the existing hemodialysis catheter was prepped with chlorhexidine in a sterile fashion, and a sterile drape was applied covering the operative field. Maximum barrier sterile technique with sterile gowns and gloves were used for the procedure. A timeout was performed prior to the initiation of the procedure. Preprocedural spot fluoroscopic image was obtained of the left pelvis and left common femoral vein approach hemodialysis catheter. The hemodialysis catheter was noted to be located within an atypical positioned, to the left of the L5 vertebral body. Neither lumen of the pre-existing hemodialysis catheter were able to be aspirated. Pull-back venogram performed via the pre-existing hemodialysis catheter demonstrates chronic occlusion of both the IVC as well as the pelvic venous system with filling of multiple para lumbar venous collaterals. Next, two stiff glide wires were advanced through both lumens of the dialysis catheter and exchanged for a new, 23 cm tip to cuff hemodialysis catheter. After catheter exchange, neither lumen was noted to aspirate. As such, the catheter was slightly retracted to the level of the left external iliac vein where some blood return was noted. The dialysis catheter was secured in place at this location. The patient tolerated the procedure well without immediate post procedural complication. IMPRESSION: Technically successful fluoroscopic guided exchange and  repositioning of 23 cm tip to cuff tunneled left common femoral vein approach hemodialysis catheter with tip terminating within the left external iliac vein. Neither lumen of the exchanged dialysis catheter was able to be easily aspirated however the pre-existing catheter demonstrated similar functionality and reportedly has been serving as a source of intermittent successful dialysis and as such the exchanged catheter was left in place for attempted dialysis. PLAN: If this exchanged dialysis catheter does NOT serve as a durable source of successful dialysis, agree with evaluation for consideration of trans hepatic dialysis catheter placement as patient has exhausted all other options for dialysis access. Electronically Signed   By: Simonne Come M.D.   On: 01/29/2023 16:14   IR Veno/Ext/Uni Left  Result Date: 01/29/2023 INDICATION: Bacteremia. Malfunctioning left common femoral vein approach hemodialysis catheter. Patient with longstanding history of end-stage renal disease renal disease with extremely limited dialysis catheter access, currently receiving intermittently successful dialysis from a chronic left common femoral approach hemodialysis catheter, initially placed by the interventional radiology service on 04/23/2022, by report, most recently exchanged by the vascular surgery service approximately 1 month ago. The patient presents today for fluoroscopic guided dialysis catheter exchange. EXAM: TUNNELED CENTRAL VENOUS HEMODIALYSIS CATHETER REPLACEMENT WITH FLUOROSCOPIC GUIDANCE MEDICATIONS: None CONTRAST:  25 cc Omnipaque 300 FLUOROSCOPY TIME:  367 mGy COMPLICATIONS: None immediate. PROCEDURE: Informed written consent was obtained from the patient after a discussion of the risks, benefits, and alternatives to treatment. Questions regarding  the procedure were encouraged and answered. The skin and external portion of the existing hemodialysis catheter was prepped with chlorhexidine in a sterile fashion, and  a sterile drape was applied covering the operative field. Maximum barrier sterile technique with sterile gowns and gloves were used for the procedure. A timeout was performed prior to the initiation of the procedure. Preprocedural spot fluoroscopic image was obtained of the left pelvis and left common femoral vein approach hemodialysis catheter. The hemodialysis catheter was noted to be located within an atypical positioned, to the left of the L5 vertebral body. Neither lumen of the pre-existing hemodialysis catheter were able to be aspirated. Pull-back venogram performed via the pre-existing hemodialysis catheter demonstrates chronic occlusion of both the IVC as well as the pelvic venous system with filling of multiple para lumbar venous collaterals. Next, two stiff glide wires were advanced through both lumens of the dialysis catheter and exchanged for a new, 23 cm tip to cuff hemodialysis catheter. After catheter exchange, neither lumen was noted to aspirate. As such, the catheter was slightly retracted to the level of the left external iliac vein where some blood return was noted. The dialysis catheter was secured in place at this location. The patient tolerated the procedure well without immediate post procedural complication. IMPRESSION: Technically successful fluoroscopic guided exchange and repositioning of 23 cm tip to cuff tunneled left common femoral vein approach hemodialysis catheter with tip terminating within the left external iliac vein. Neither lumen of the exchanged dialysis catheter was able to be easily aspirated however the pre-existing catheter demonstrated similar functionality and reportedly has been serving as a source of intermittent successful dialysis and as such the exchanged catheter was left in place for attempted dialysis. PLAN: If this exchanged dialysis catheter does NOT serve as a durable source of successful dialysis, agree with evaluation for consideration of trans hepatic  dialysis catheter placement as patient has exhausted all other options for dialysis access. Electronically Signed   By: Simonne Come M.D.   On: 01/29/2023 16:14   MR FOOT RIGHT WO CONTRAST  Result Date: 01/29/2023 CLINICAL DATA:  Right foot infection. Great toe amputation on 12/12/2022 EXAM: MRI OF THE RIGHT FOREFOOT WITHOUT CONTRAST TECHNIQUE: Multiplanar, multisequence MR imaging of the right forefoot was performed. No intravenous contrast was administered. COMPARISON:  X-ray 01/27/2023 FINDINGS: Bones/Joint/Cartilage Postsurgical changes from transmetatarsal amputation of the first ray. There is bone marrow edema within the distal 7 mm of the residual first metatarsal diaphysis at the resection margin. No confluent low T1 signal changes at this location. The remaining bony structures of the forefoot are otherwise within normal limits. No fracture or dislocation. No additional sites of focal bone marrow edema or marrow replacement. No joint effusions. Ligaments Intact Lisfranc ligament.  Intact collateral ligaments. Muscles and Tendons Diffuse edema-like signal of the foot musculature which may represent changes related to denervation and/or myositis. No tenosynovitis. Soft tissues Diffuse soft tissue swelling. Irregularity within the skin and superficial soft tissues at the medial aspect of the forefoot, likely along the surgical incision site. There is fluid and edema which tracks to the resection margin of the first metatarsal (series 4, image 29). There are no organized or drainable fluid collections. IMPRESSION: 1. 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 There is bone marrow edema within the distal aspect of the residual first metatarsal diaphysis at the resection margin. Appearance is most suggestive of early acute osteomyelitis given the appearance of the overlying soft tissues. 2. Fluid and edema tracks  to the resection margin of the first metatarsal. 3. No organized or  drainable fluid collections. 4. Diffuse edema-like signal of the foot musculature which may represent changes related to denervation and/or myositis. Electronically Signed   By: Duanne Guess D.O.   On: 01/29/2023 14:39   Medications:  sodium chloride Stopped (01/30/23 0050)   cefTRIAXone (ROCEPHIN)  IV Stopped (01/30/23 0050)   heparin 2,100 Units/hr (01/30/23 0730)    (feeding supplement) PROSource Plus  30 mL Oral BID BM   Chlorhexidine Gluconate Cloth  6 each Topical Daily   Chlorhexidine Gluconate Cloth  6 each Topical Q0600   darbepoetin (ARANESP) injection - DIALYSIS  150 mcg Subcutaneous Q Thu-1800   ferric citrate  630 mg Oral TID WC   metroNIDAZOLE  500 mg Oral Q12H   midodrine  15 mg Oral TID WC   vancomycin variable dose per unstable renal function (pharmacist dosing)   Does not apply See admin instructions

## 2023-01-30 NOTE — Progress Notes (Addendum)
Received patient in bed to unit.  Alert and oriented.  Informed consent signed and in chart.   TX duration: did not run this morning   Transported back to the room  Alert, without acute distress.  Hand-off given to patient's nurse.   Access used: Left Femoral HD Cath Access issues: Catheter access did not work, 1.9cc Cathflo Activase per lumen dwell instilled per Dr. Arlean Hopping.  Dr. Arlean Hopping says to try 2nd shift.    Stacie Glaze LPN [facility]

## 2023-01-30 NOTE — Progress Notes (Signed)
Pt arrived to the dialysis unit to attempt to access his Left femoral dialysis cather that had malfunctioned earlier on the first shift and they had instilled cathflo to bilateral lumens per protocol--- Arterial lumen--able to withdraw the cathflo volume out but no more--resisitance with pushing saline in and there is clearly fluid coming out of the insertions site when doing so Venous lumen---same issues Evette Cristal PA given full status report---to close off the catheter and return the pt to the floor--cannot have a tx tonight--- Arrangements being made by nephrology to manage pts potassium as well as set up for IR for access--pt is asymptomatic---

## 2023-01-30 NOTE — Progress Notes (Signed)
Interventional Radiology Brief Note:  Patient for anticipated transhepatic catheter placement, however case deferred due to NPO status and K 5.5.   Nephrology team has ordered [facility] to be given through the weekend.   Will reassess for possible procedure Monday with IR RN sedation or consider anesthesia if remains hyperkalemic.   NPO order placed for Monday.   Loyce Dys, MS RD PA-C

## 2023-01-30 NOTE — Progress Notes (Signed)
ANTICOAGULATION CONSULT NOTE Pharmacy Consult for heparin  Indication: atrial fibrillation Brief A/P: aPTT subtherapeutic Increase Heparin rate  No Known Allergies  Patient Measurements: Height: 6\' 2"  (188 cm) Weight: (!) 141.8 kg (312 lb 9.8 oz) (bed) IBW/kg (Calculated) : 82.2 Heparin Dosing Weight: 114 kg  Vital Signs: Temp: 98.7 F (37.1 C) (08/23 2050) BP: 115/79 (08/23 2050) Pulse Rate: 70 (08/23 2050)  Labs: Recent Labs    01/28/23 0630 01/28/23 0630 01/28/23 2054 01/29/23 0833 01/30/23 0505 01/30/23 0722 01/30/23 1354 01/30/23 2239  HGB 9.5*  --   --  8.0* 8.2*  --   --   --   HCT 29.9*  --   --  24.9* 25.5*  --   --   --   PLT 227  --   --  271 303  --   --   --   APTT  --    < > 45* 44* 43*  --  46* 57*  HEPARINUNFRC  --   --  >1.10* 0.80* 0.55  --   --   --   CREATININE 19.76*  --   --   --  20.48* 20.75*  --   --    < > = values in this interval not displayed.    Estimated Creatinine Clearance: 6.5 mL/min (A) (by C-G formula based on SCr of 20.75 mg/dL (H)).  Assessment: 47 y.o. male with h/o Afib, Eliquis on hold, for heparin  Goal of Therapy:  Heparin level 0.3-0.7 units/ml aPTT 66 - 102 seconds Monitor platelets by anticoagulation protocol: Yes   Plan:  Increase Heparin 2600 units/hr Follow-up am labs.   Geannie Risen, PharmD, BCPS  01/30/2023 11:38 PM

## 2023-01-30 NOTE — Progress Notes (Signed)
ANTICOAGULATION CONSULT NOTE  Pharmacy Consult for Eliquis PTA >> heparin infusion Indication: atrial fibrillation  No Known Allergies  Patient Measurements: Height: 6\' 2"  (188 cm) Weight: (!) 141.8 kg (312 lb 9.8 oz) (bed) IBW/kg (Calculated) : 82.2 Heparin Dosing Weight: 114 kg  Vital Signs: Temp: 98 F (36.7 C) (08/23 0736) Temp Source: Oral (08/23 0736) BP: 107/84 (08/23 0807) Pulse Rate: 63 (08/23 0807)  Labs: Recent Labs    01/27/23 1523 01/27/23 2140 01/28/23 0630 01/28/23 0630 01/28/23 2054 01/29/23 0833 01/30/23 0505 01/30/23 0722 01/30/23 1354  HGB  --    < > 9.5*  --   --  8.0* 8.2*  --   --   HCT  --   --  29.9*  --   --  24.9* 25.5*  --   --   PLT  --   --  227  --   --  271 303  --   --   APTT  --   --   --    < > 45* 44* 43*  --  46*  LABPROT 19.2*  --   --   --   --   --   --   --   --   INR 1.6*  --   --   --   --   --   --   --   --   HEPARINUNFRC  --   --   --   --  >1.10* 0.80* 0.55  --   --   CREATININE  --    < > 19.76*  --   --   --  20.48* 20.75*  --    < > = values in this interval not displayed.    Estimated Creatinine Clearance: 6.5 mL/min (A) (by C-G formula based on SCr of 20.75 mg/dL (H)).   Medical History: Past Medical History:  Diagnosis Date   Acute respiratory failure with hypoxia (HCC) 11/30/2018   Anemia    ESRD   ESRD on hemodialysis (HCC) 06/07/2011   TTS [facility]. Started HD in 2006, got transplant in 2014 lasted until Dec 2019 then went back on HD.  Has L thigh AVG as of Jun 2020.  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    History of blood transfusion    Hypertension    03/19/22- has not had high blood pressure in 5 years.   Morbid obesity (HCC)    Paroxysmal atrial fibrillation (HCC)    Pre-diabetes    no meds   Sepsis (HCC) 11/30/2018   Sleep apnea    lost weight, does not use CPAP    Medications:  Apixaban 5 mg PO BID prior to admission for atrial fibrillation  Assessment: 47 YOM who  presented for septic shock with hypotension, lactic acid 2.2, requiring fluid resuscitation and vasopressors. Source of infection suspected to be tunneled dialysis catheter requiring possible replacement. Initiating heparin infusion in lieu of apixaban for potential line replacement. Last dose of apixaban 8/19 per patient, will assess both heparin level and aPTT until correlating.   Heparin level therapeutic at 0.55 , aPTT subtherapeutic at 46 seconds after rate increase. Levels are not yet correlating so will continue to dose IV heparin off aPTT's. Heparin drip has been running consistently without issues and no bleeding noted per discussion with RN.   Goal of Therapy:  Heparin level 0.3-0.7 units/ml aPTT 66 - 102 seconds Monitor platelets by anticoagulation protocol: Yes   Plan:  Increase heparin drip to 2300 units/hr Check aPTT in 8 hours Check daily heparin level, aPTT, and CBC F/u IR plans for cath exchange for ability to transition back to Eliquis   Rexford Maus, PharmD, BCPS 01/30/2023 2:48 PM

## 2023-01-30 NOTE — Progress Notes (Signed)
RN called by IR dept asking if pt has been NPO. This RN\ states pt received meds including Prosource at 10am. IR informed RN that procedure can not be done b/c he had the Prosource. This RN informed IR that there was no NPO order placed at the times the meds were admin at 10a. RN informed IR that NPO status was ordered at 1239 today. Pt explained to pt the above, pt voiced frustration.

## 2023-01-30 NOTE — Progress Notes (Addendum)
[facility]  Date of Admission:  01/27/2023     Total days of antibiotics 4         ASSESSMENT:   Mr. Crytzer right foot MRI concerning for early acute osteomyelitis. Request Podiatry to evaluate for any potential surgical recommendations if appropriate. IR exploring vascular access for dialysis with failure of current catheter to function and consideration for transhepatic catheter. Request removal of femoral catheter once new access has been established given increased risk for infection. Blood cultures from 01/29/23 are without growth and Proteus sensitivities with no significant resistance and will narrow cefepime to cefazolin and continue with vancomycin and metronidazole for previous MRSA and Bacteroides. Remaining medical and supportive care per Internal Medicine and Nephrology.   PLAN:  Change cefepime to cefazolin.  Continue current dose of vancomycin and metronidazole. Recommend Podiatry evaluation of right foot for any potential surgical interventions.  Recommend removal of femoral HD line once new vascular access has been established.  Monitor blood cultures for clearance of bacteremia or additional organisms. Remaining medical and supportive care per Internal Medicine and Nephrology.   I have personally spent 22 minutes involved in face-to-face and non-face-to-face activities for this patient on the day of the visit. Professional time spent includes the following activities: Preparing to see the patient (review of tests), Obtaining and/or reviewing separately obtained history (admission/discharge record), Performing a medically appropriate examination and/or evaluation , Ordering medications/tests/procedures, referring and communicating with other health care professionals, Documenting clinical information in the EMR, Independently interpreting results (not separately reported), Communicating results to the patient/family/caregiver, Counseling and  educating the patient/family/caregiver and Care coordination (not separately reported).   Active Problems:   Paroxysmal atrial fibrillation (HCC)   Septic shock (HCC)   Chronic anticoagulation   Gram-negative bacteremia   ESRD on hemodialysis (HCC)    (feeding supplement) PROSource Plus  30 mL Oral BID BM   Chlorhexidine Gluconate Cloth  6 each Topical Daily   Chlorhexidine Gluconate Cloth  6 each Topical Q0600   darbepoetin (ARANESP) injection - DIALYSIS  150 mcg Subcutaneous Q Thu-1800   ferric citrate  630 mg Oral TID WC   metroNIDAZOLE  500 mg Oral Q12H   midodrine  15 mg Oral TID WC   sodium zirconium cyclosilicate  10 g Oral BID   vancomycin variable dose per unstable renal function (pharmacist dosing)   Does not apply See admin instructions    SUBJECTIVE:  Afebrile overnight with no acute events. Dialysis catheter did not work for dialysis today.   No Known Allergies   Review of Systems: Review of Systems  Constitutional:  Negative for chills, fever and weight loss.  Respiratory:  Negative for cough, shortness of breath and wheezing.   Cardiovascular:  Negative for chest pain and leg swelling.  Gastrointestinal:  Negative for abdominal pain, constipation, diarrhea, nausea and vomiting.  Skin:  Negative for rash.      OBJECTIVE: Vitals:   01/30/23 0736 01/30/23 0757 01/30/23 0807 01/30/23 0847  BP: 105/69  107/84   Pulse: 72  63   Resp: 20  (!) 24   Temp: 98 F (36.7 C)     TempSrc: Oral     SpO2: 100%  100%   Weight:  (!) 141.8 kg  (!) 141.8 kg  Height:       Body mass index is 40.14 kg/m.  Physical Exam Constitutional:      General: He is not in acute distress.    Appearance: He is  well-developed.  Cardiovascular:     Rate and Rhythm: Normal rate and regular rhythm.     Heart sounds: Normal heart sounds.  Pulmonary:     Effort: Pulmonary effort is normal.     Breath sounds: Normal breath sounds.  Skin:    General: Skin is warm and dry.   Neurological:     Mental Status: He is alert.  Psychiatric:        Mood and Affect: Mood normal.     Lab Results Lab Results  Component Value Date   WBC 11.1 (H) 01/30/2023   HGB 8.2 (L) 01/30/2023   HCT 25.5 (L) 01/30/2023   MCV 84.7 01/30/2023   PLT 303 01/30/2023    Lab Results  Component Value Date   CREATININE 20.75 (H) 01/30/2023   BUN 107 (H) 01/30/2023   NA 131 (L) 01/30/2023   K 5.5 (H) 01/30/2023   CL 96 (L) 01/30/2023   CO2 15 (L) 01/30/2023    Lab Results  Component Value Date   ALT 46 (H) 01/28/2023   AST 34 01/28/2023   ALKPHOS 120 01/28/2023   BILITOT 1.2 01/28/2023     Microbiology: Recent Results (from the past 240 hour(s))  Culture, blood (Routine x 2)     Status: Abnormal   Collection Time: 01/27/23 11:00 AM   Specimen: BLOOD  Result Value Ref Range Status   Specimen Description BLOOD RIGHT ANTECUBITAL  Final   Special Requests   Final    BOTTLES DRAWN AEROBIC AND ANAEROBIC Blood Culture adequate volume   Culture  Setup Time   Final    GRAM NEGATIVE RODS IN BOTH AEROBIC AND ANAEROBIC BOTTLES CRITICAL RESULT CALLED TO, READ BACK BY AND VERIFIED WITH: V BRYK,[id number]  01/28/23 MK Performed at [facility], [address], [place], [place] [address]    Culture PROTEUS MIRABILIS (A)  Final   Report Status 01/30/2023 FINAL  Final   Organism ID, Bacteria PROTEUS MIRABILIS  Final      Susceptibility   Proteus mirabilis - MIC*    AMPICILLIN <=2 SENSITIVE Sensitive     CEFEPIME <=0.12 SENSITIVE Sensitive     CEFTAZIDIME <=1 SENSITIVE Sensitive     CEFTRIAXONE <=0.25 SENSITIVE Sensitive     CIPROFLOXACIN <=0.25 SENSITIVE Sensitive     GENTAMICIN <=1 SENSITIVE Sensitive     IMIPENEM 2 SENSITIVE Sensitive     TRIMETH/SULFA <=20 SENSITIVE Sensitive     AMPICILLIN/SULBACTAM <=2 SENSITIVE Sensitive     PIP/TAZO <=4 SENSITIVE Sensitive     * PROTEUS MIRABILIS  Blood Culture ID Panel (Reflexed)     Status: Abnormal   Collection Time:  01/27/23 11:00 AM  Result Value Ref Range Status   Enterococcus faecalis NOT DETECTED NOT DETECTED Final   Enterococcus Faecium NOT DETECTED NOT DETECTED Final   Listeria monocytogenes NOT DETECTED NOT DETECTED Final   Staphylococcus species NOT DETECTED NOT DETECTED Final   Staphylococcus aureus (BCID) NOT DETECTED NOT DETECTED Final   Staphylococcus epidermidis NOT DETECTED NOT DETECTED Final   Staphylococcus lugdunensis NOT DETECTED NOT DETECTED Final   Streptococcus species NOT DETECTED NOT DETECTED Final   Streptococcus agalactiae NOT DETECTED NOT DETECTED Final   Streptococcus pneumoniae NOT DETECTED NOT DETECTED Final   Streptococcus pyogenes NOT DETECTED NOT DETECTED Final   A.calcoaceticus-baumannii NOT DETECTED NOT DETECTED Final   Bacteroides fragilis NOT DETECTED NOT DETECTED Final   Enterobacterales DETECTED (A) NOT DETECTED Final    Comment: Enterobacterales represent a large order of gram negative  bacteria, not a single organism. CRITICAL RESULT CALLED TO, READ BACK BY AND VERIFIED WITH: V BRYK,[email or web address]@[id number]  01/28/23 MK    Enterobacter cloacae complex NOT DETECTED NOT DETECTED Final   Escherichia coli NOT DETECTED NOT DETECTED Final   Klebsiella aerogenes NOT DETECTED NOT DETECTED Final   Klebsiella oxytoca NOT DETECTED NOT DETECTED Final   Klebsiella pneumoniae NOT DETECTED NOT DETECTED Final   Proteus species DETECTED (A) NOT DETECTED Final    Comment: CRITICAL RESULT CALLED TO, READ BACK BY AND VERIFIED WITH: V BRYK,PHARMD@[id number]  01/28/23 MK    Salmonella species NOT DETECTED NOT DETECTED Final   Serratia marcescens NOT DETECTED NOT DETECTED Final   Haemophilus influenzae NOT DETECTED NOT DETECTED Final   Neisseria meningitidis NOT DETECTED NOT DETECTED Final   Pseudomonas aeruginosa NOT DETECTED NOT DETECTED Final   Stenotrophomonas maltophilia NOT DETECTED NOT DETECTED Final   Candida albicans NOT DETECTED NOT DETECTED Final   Candida auris NOT DETECTED NOT  DETECTED Final   Candida glabrata NOT DETECTED NOT DETECTED Final   Candida krusei NOT DETECTED NOT DETECTED Final   Candida parapsilosis NOT DETECTED NOT DETECTED Final   Candida tropicalis NOT DETECTED NOT DETECTED Final   Cryptococcus neoformans/gattii NOT DETECTED NOT DETECTED Final   CTX-M ESBL NOT DETECTED NOT DETECTED Final   Carbapenem resistance IMP NOT DETECTED NOT DETECTED Final   Carbapenem resistance KPC NOT DETECTED NOT DETECTED Final   Carbapenem resistance NDM NOT DETECTED NOT DETECTED Final   Carbapenem resist OXA 48 LIKE NOT DETECTED NOT DETECTED Final   Carbapenem resistance VIM NOT DETECTED NOT DETECTED Final    Comment: Performed at [facility], [address], [place], [place] [address]  SARS Coronavirus 2 by RT PCR (hospital order, performed in [facility] hospital lab) *cepheid single result test* Anterior Nasal Swab     Status: None   Collection Time: 01/27/23 11:42 AM   Specimen: Anterior Nasal Swab  Result Value Ref Range Status   SARS Coronavirus 2 by RT PCR NEGATIVE NEGATIVE Final    Comment: Performed at [facility], [address], [place], [place] [address]  Culture, blood (Routine x 2)     Status: None (Preliminary result)   Collection Time: 01/27/23 11:50 AM   Specimen: BLOOD RIGHT HAND  Result Value Ref Range Status   Specimen Description BLOOD RIGHT HAND  Final   Special Requests   Final    BOTTLES DRAWN AEROBIC AND ANAEROBIC Blood Culture adequate volume   Culture   Final    NO GROWTH 3 DAYS Performed at [facility], [address] [place], [place] [address]    Report Status PENDING  Incomplete  MRSA Next Gen by PCR, Nasal     Status: None   Collection Time: 01/27/23  3:09 PM   Specimen: Nasal Mucosa; Nasal Swab  Result Value Ref Range Status   MRSA by PCR Next Gen NOT DETECTED NOT DETECTED Final    Comment: (NOTE) The GeneXpert MRSA Assay (FDA approved for NASAL specimens only), is one component of a comprehensive MRSA  colonization surveillance program. It is not intended to diagnose MRSA infection nor to guide or monitor treatment for MRSA infections. Test performance is not FDA approved in patients less than 57 years old. Performed at [facility], [address] [place], [place] [address]   Culture, blood (Routine X 2) w Reflex to ID Panel     Status: None (Preliminary result)   Collection Time: 01/29/23  8:33 AM   Specimen: BLOOD LEFT ARM  Result Value Ref Range Status   Specimen Description BLOOD LEFT ARM  Final   Special Requests   Final    BOTTLES DRAWN AEROBIC ONLY Blood Culture adequate volume   Culture   Final    NO GROWTH < 24 HOURS Performed at [facility], [address] [place], [place] [address]    Report Status PENDING  Incomplete  Culture, blood (Routine X 2) w Reflex to ID Panel     Status: None (Preliminary result)   Collection Time: 01/29/23  8:35 AM   Specimen: BLOOD RIGHT HAND  Result Value Ref Range Status   Specimen Description BLOOD RIGHT HAND  Final   Special Requests   Final    BOTTLES DRAWN AEROBIC ONLY Blood Culture adequate volume   Culture   Final    NO GROWTH < 24 HOURS Performed at [facility], [address], [place], [place] [address]    Report Status PENDING  Incomplete     Marcos Eke, NP [facility]  01/30/2023  2:58 PM

## 2023-01-30 NOTE — Progress Notes (Signed)
ANTICOAGULATION CONSULT NOTE Pharmacy Consult for heparin  Indication: atrial fibrillation Brief A/P: aPTT subtherapeutic Increase Heparin rate  No Known Allergies  Patient Measurements: Height: 6\' 2"  (188 cm) Weight: (!) 141.6 kg (312 lb 2.7 oz) IBW/kg (Calculated) : 82.2 Heparin Dosing Weight: 114 kg  Vital Signs: Temp: 98.3 F (36.8 C) (08/23 0522) Temp Source: Oral (08/23 0522) BP: 121/79 (08/23 0522) Pulse Rate: 73 (08/23 0522)  Labs: Recent Labs    01/27/23 1523 01/27/23 2140 01/28/23 0630 01/28/23 2054 01/29/23 0833 01/30/23 0505  HGB  --   --  9.5*  --  8.0* 8.2*  HCT  --   --  29.9*  --  24.9* 25.5*  PLT  --   --  227  --  271 303  APTT  --   --   --  45* 44* 43*  LABPROT 19.2*  --   --   --   --   --   INR 1.6*  --   --   --   --   --   HEPARINUNFRC  --   --   --  >1.10* 0.80* 0.55  CREATININE  --  20.17* 19.76*  --   --  20.48*    Estimated Creatinine Clearance: 6.6 mL/min (A) (by C-G formula based on SCr of 20.48 mg/dL (H)).  Assessment: 47 y.o. male with h/o Afib, Eliquis on hold and now s/p HD cath replacement, for heparin  Goal of Therapy:  Heparin level 0.3-0.7 units/ml aPTT 66 - 102 seconds Monitor platelets by anticoagulation protocol: Yes   Plan:  Increase Heparin 2100 units/hr Check aPTT in 8 hours  Geannie Risen, PharmD, BCPS  01/30/2023 5:48 AM

## 2023-01-30 NOTE — Progress Notes (Signed)
Pharmacy Antibiotic Note  CYLAS BELMONT is a 47 y.o. male admitted on 01/27/2023  with known history of MRSA foot osteo/abscess recently treated with Vancomycin for 4 weeks with HD (EOT ~8/2). This admission found to have proteus mirabilis bacteremia - possible sources include foot osteo vs femoral HD cath. Pharmacy has been consulted to resume Vancomycin + Flagyl, narrow Rocephin to Cefazolin.   Noted HD attempted this AM but HD catheter not working appropriately. Plan is for eventual trans-hepatic catheter however deferred for today since patient has not been made NPO. Will need to follow-up on the patient's plans for HD.  Plan: - No standing Vancomycin doses for now - levels therapeutic, will follow-up on HD schedule - Narrow Ceftriaxone to Cefazolin 1g IV every 24 hours - Continue Flagyl 500 mg po bid - Will continue to follow HD schedule/duration, culture results, LOT, and antibiotic de-escalation plans   Height: 6\' 2"  (188 cm) Weight: (!) 141.8 kg (312 lb 9.8 oz) (bed) IBW/kg (Calculated) : 82.2  Temp (24hrs), Avg:98.6 F (37 C), Min:98 F (36.7 C), Max:99.1 F (37.3 C)  Recent Labs  Lab 01/27/23 1114 01/27/23 1125 01/27/23 1523 01/27/23 1742 01/27/23 2140 01/28/23 0630 01/29/23 0833 01/30/23 0505 01/30/23 0722  WBC 5.6  --   --   --   --  9.2 9.4 11.1*  --   CREATININE 19.73*  --   --   --  20.17* 19.76*  --  20.48* 20.75*  LATICACIDVEN  --  2.2* 1.0 1.0  --   --   --   --   --   VANCORANDOM  --   --   --   --   --  32 24  --   --     Estimated Creatinine Clearance: 6.5 mL/min (A) (by C-G formula based on SCr of 20.75 mg/dL (H)).    No Known Allergies  Antimicrobials this admission: Cefepime 8/20 >> Rocephin 8/20 >> 8/22 Flagyl 8/20 >> 8/21; restart 8/22 Vancomycin 8/20 >> Cefazolin 8/23 >>  Dose adjustments this admission: N/a  Microbiology results: 8/20 COVID >> neg 8/20 MRSA PCR >> neg 8/20 BCx >> 1/4 Proteus mirabilis 8/22 BCx >> ng<24h  Thank  you for allowing pharmacy to be a part of this patient's care.  Georgina Pillion, PharmD, BCPS, BCIDP Infectious Diseases Clinical Pharmacist 01/30/2023 2:14 PM   **Pharmacist phone directory can now be found on [email or web address] ([email or web address]).  Listed under [facility].

## 2023-01-31 DIAGNOSIS — R7881 Bacteremia: Secondary | ICD-10-CM | POA: Diagnosis not present

## 2023-01-31 LAB — BASIC METABOLIC PANEL
Anion gap: 21 — ABNORMAL HIGH (ref 5–15)
BUN: 111 mg/dL — ABNORMAL HIGH (ref 6–20)
CO2: 14 mmol/L — ABNORMAL LOW (ref 22–32)
Calcium: 8.3 mg/dL — ABNORMAL LOW (ref 8.9–10.3)
Chloride: 95 mmol/L — ABNORMAL LOW (ref 98–111)
Creatinine, Ser: 22.18 mg/dL — ABNORMAL HIGH (ref 0.61–1.24)
GFR, Estimated: 2 mL/min — ABNORMAL LOW (ref >60)
Glucose, Bld: 107 mg/dL — ABNORMAL HIGH (ref 70–99)
Potassium: 5.2 mmol/L — ABNORMAL HIGH (ref 3.5–5.1)
Sodium: 130 mmol/L — ABNORMAL LOW (ref 135–145)

## 2023-01-31 LAB — CBC
HCT: 24.4 % — ABNORMAL LOW (ref 39.0–52.0)
Hemoglobin: 7.9 g/dL — ABNORMAL LOW (ref 13.0–17.0)
MCH: 27.3 pg (ref 26.0–34.0)
MCHC: 32.4 g/dL (ref 30.0–36.0)
MCV: 84.4 fL (ref 80.0–100.0)
Platelets: 306 10*3/uL (ref 150–400)
RBC: 2.89 MIL/uL — ABNORMAL LOW (ref 4.22–5.81)
RDW: 18.6 % — ABNORMAL HIGH (ref 11.5–15.5)
WBC: 8.9 10*3/uL (ref 4.0–10.5)
nRBC: 0 % (ref 0.0–0.2)

## 2023-01-31 LAB — HEPARIN LEVEL (UNFRACTIONATED): Heparin Unfractionated: 0.51 [IU]/mL (ref 0.30–0.70)

## 2023-01-31 LAB — APTT: aPTT: 80 s — ABNORMAL HIGH (ref 24–36)

## 2023-01-31 MED ORDER — SODIUM ZIRCONIUM CYCLOSILICATE 10 G PO PACK
10.0000 g | PACK | Freq: Three times a day (TID) | ORAL | Status: AC
  Administered 2023-01-31 – 2023-02-01 (×3): 10 g via ORAL
  Filled 2023-01-31 (×3): qty 1

## 2023-01-31 MED ORDER — SODIUM BICARBONATE 650 MG PO TABS
1300.0000 mg | ORAL_TABLET | Freq: Three times a day (TID) | ORAL | Status: DC
  Administered 2023-01-31 – 2023-02-03 (×6): 1300 mg via ORAL
  Filled 2023-01-31 (×7): qty 2

## 2023-01-31 NOTE — Plan of Care (Signed)
  Problem: Education: Goal: Knowledge of General Education information will improve Description Including pain rating scale, medication(s)/side effects and non-pharmacologic comfort measures Outcome: Progressing   

## 2023-01-31 NOTE — Progress Notes (Signed)
ANTICOAGULATION CONSULT NOTE  Pharmacy Consult for Eliquis PTA >> heparin infusion Indication: atrial fibrillation  No Known Allergies  Patient Measurements: Height: 6\' 2"  (188 cm) Weight: (!) 143.2 kg (315 lb 11.2 oz) IBW/kg (Calculated) : 82.2 Heparin Dosing Weight: 114 kg  Vital Signs: Temp: 98.2 F (36.8 C) (08/24 0805) Temp Source: Oral (08/24 0501) BP: 97/65 (08/24 0805) Pulse Rate: 81 (08/24 0805)  Labs: Recent Labs    01/29/23 8413 01/30/23 0505 01/30/23 0722 01/30/23 1354 01/30/23 2239 01/31/23 0818  HGB 8.0* 8.2*  --   --   --  7.9*  HCT 24.9* 25.5*  --   --   --  24.4*  PLT 271 303  --   --   --  306  APTT 44* 43*  --  46* 57* 80*  HEPARINUNFRC 0.80* 0.55  --   --   --  0.51  CREATININE  --  20.48* 20.75*  --   --  22.18*    Estimated Creatinine Clearance: 6.1 mL/min (A) (by C-G formula based on SCr of 22.18 mg/dL (H)).   Medical History: Past Medical History:  Diagnosis Date   Acute respiratory failure with hypoxia (HCC) 11/30/2018   Anemia    ESRD   ESRD on hemodialysis (HCC) 06/07/2011   TTS [facility]. Started HD in 2006, got transplant in 2014 lasted until Dec 2019 then went back on HD.  Has L thigh AVG as of Jun 2020.  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    History of blood transfusion    Hypertension    03/19/22- has not had high blood pressure in 5 years.   Morbid obesity (HCC)    Paroxysmal atrial fibrillation (HCC)    Pre-diabetes    no meds   Sepsis (HCC) 11/30/2018   Sleep apnea    lost weight, does not use CPAP    Medications:  Apixaban 5 mg PO BID prior to admission for atrial fibrillation  Assessment: 47 YOM who presented for septic shock with hypotension, lactic acid 2.2, requiring fluid resuscitation and vasopressors. Source of infection suspected to be tunneled dialysis catheter requiring possible replacement. Initiating heparin infusion in lieu of apixaban for potential line replacement.  Heparin level and  PTT therapeutic   Goal of Therapy:  Heparin level 0.3-0.7 units/ml aPTT 66 - 102 seconds Monitor platelets by anticoagulation protocol: Yes   Plan:  Continue heparin at 2600 units / hr Daily heparin level, CBC   Okey Regal, PharmD 01/31/2023 10:08 AM

## 2023-01-31 NOTE — Progress Notes (Addendum)
Subjective: Seen in room examined, no shortness of breath no chest pain, somewhat depressed about his situation, repeated need to limit p.o. 1200 daily.  Noted IR plans transhepatic catheter Monday 8/26  Objective Vital signs in last 24 hours: Vitals:   01/30/23 2050 01/31/23 0501 01/31/23 0715 01/31/23 0805  BP: 115/79 115/72  97/65  Pulse: 70 77  81  Resp: 17 17  18   Temp: 98.7 F (37.1 C) 98.5 F (36.9 C)  98.2 F (36.8 C)  TempSrc:  Oral    SpO2: 96% 96%  92%  Weight:   (!) 143.2 kg   Height:       Weight change:   Physical Exam: General: Alert obese adult male NAD room air Heart: RRR no MRG Lungs: CTA bilaterally nonlabored breathing Abdomen: NABS, obese, soft, ND, NT Extremities: No bipedal edema Dialysis Access: Left femoral TDC dressing dry clean   OP dialysis Orders: TTS at [facility] 4.5hr, 500/500, EDW 137kg, 2K/2Ca bath, TDC L femoral, heparin 5000 initial + 2500 mid run bolus - Parsabiv 7.5mg  IV q HD - Mircera IV q 2 weeks - last 8/6   Assessment/Plan: Proteus bacteremia: Blood Cx 8/20 (2 of 4 positive?), repeat 8/22 pending. On Cefepime. Source = TDC or R foot wound. Old TDC removed, new one replaced 8/22 with IR.  Nonfunctional Dialysis access: Running out of options unfortunately, has been on HD ~20 years. Known central venous disease/SVC stenosis, IR planning 8/26 transhepatic catheter.  New L femoral TDC 8/22 nonfunctional.  Had  been followed by [facility]=plans for HeRO graft in the near future - of course that will be on hold until infection cleared. Chronic R foot wound: S/p R toe amputation 7/5, not healing. Possible early osteo on imaging - Vanc, Flagyl being added per ID. ESRD: TTS OP schedule , a.m. lab= BUN 111,  Cr 22.18 , NA 130, CO2= 14 (start sodium bicarb p.o. )K5.2 improved from 5.5 on Lokelma 10 gm po bid Last  HD =short-HD on Tuesday 8/20, so dialyzed again here 8/21   - Hypotension/volume: Chronic low BP on  midodrine 15mg  TID, up per scale but seems euvolemic on exam.  6 kg over EDW Anemia of ESRD: Hgb 7.9 (probably dilutional )< 8.2  -  Aranesp 150 mg given Thur 8/22/currently asymptomatic hold transfusion to avoid extra volume and K , follow-up Hgb trend transfuse if lower patient agrees Secondary HTPH: CorrCa ok, Phos 6.3 on Auryxia as binder  Parsabiv not formulary here. Nutrition: Alb 2.9 , adding supps. pAF: Eliquis on hold for now OSA Adjustment disorder: Mood low   Lenny Pastel, PA-C [facility] [phone] 01/31/2023,10:24 AM  LOS: 4 days   Labs: Basic Metabolic Panel: Recent Labs  Lab 01/30/23 0505 01/30/23 0722 01/31/23 0818  NA 130* 131* 130*  K 5.9* 5.5* 5.2*  CL 95* 96* 95*  CO2 17* 15* 14*  GLUCOSE 85 85 107*  BUN 102* 107* 111*  CREATININE 20.48* 20.75* 22.18*  CALCIUM 8.2* 8.3* 8.3*  PHOS  --  6.3*  --    Liver Function Tests: Recent Labs  Lab 01/27/23 1114 01/28/23 0630 01/30/23 0722  AST 50* 34  --   ALT 53* 46*  --   ALKPHOS 149* 120  --   BILITOT 0.8 1.2  --   PROT 7.5 7.2  --   ALBUMIN 2.9* 2.9* 2.8*   No results for input(s): "LIPASE", "AMYLASE" in the last 168 hours. No results for input(s): "AMMONIA"  in the last 168 hours. CBC: Recent Labs  Lab 01/27/23 1114 01/28/23 0630 01/29/23 0833 01/30/23 0505 01/31/23 0818  WBC 5.6 9.2 9.4 11.1* 8.9  NEUTROABS 4.4  --   --   --   --   HGB 8.5* 9.5* 8.0* 8.2* 7.9*  HCT 26.8* 29.9* 24.9* 25.5* 24.4*  MCV 86.7 88.7 86.2 84.7 84.4  PLT 214 227 271 303 306   Cardiac Enzymes: No results for input(s): "CKTOTAL", "CKMB", "CKMBINDEX", "TROPONINI" in the last 168 hours. CBG: Recent Labs  Lab 01/27/23 2340 01/28/23 0352 01/28/23 0739 01/28/23 1129 01/28/23 2104  GLUCAP 83 74 102* 103* 85    Studies/Results: CT ABDOMEN PELVIS W CONTRAST  Result Date: 01/29/2023 CLINICAL DATA:  47 year old male with a history of renal failure, difficult access, possible transhepatic  catheter. EXAM: CT ABDOMEN AND PELVIS WITH CONTRAST TECHNIQUE: Multidetector CT imaging of the abdomen and pelvis was performed using the standard protocol following bolus administration of intravenous contrast. RADIATION DOSE REDUCTION: This exam was performed according to the departmental dose-optimization program which includes automated exposure control, adjustment of the mA and/or kV according to patient size and/or use of iterative reconstruction technique. CONTRAST:  75mL OMNIPAQUE IOHEXOL 350 MG/ML SOLN COMPARISON:  04/14/2011 FINDINGS: Lower chest: No acute finding of the lower chest. Hepatobiliary: Timing of the contrast bolus is delayed secondary to collateral venous drainage centrally. Hepatomegaly. No focal lesion. Focal fatty infiltration adjacent to the falciform ligament. Gallbladder is decompressed. Pancreas: Unremarkable Spleen: Unremarkable Adrenals/Urinary Tract: - Right adrenal gland:  Unremarkable - Left adrenal gland: Unremarkable. - Right kidney: Atrophic right kidney with nephrolithiasis. No hydronephrosis. - Left Kidney: Atrophic left kidney with nephrolithiasis. No hydronephrosis. - Urinary Bladder: Urinary bladder is decompressed. Stomach/Bowel: - Stomach: Unremarkable. - Small bowel: Unremarkable - Appendix: Normal - Colon: Colon decompressed. Colonic diverticula of the left colon. No acute inflammatory changes. Vascular/Lymphatic: Calcifications of the coronary arteries of the visualized heart. Partial opacification of collateral draining veins of the left chest. Hepatic veins not well opacified given the timing of the contrast bolus. Portal veins not well opacified given the timing of the contrast bolus. Mild atherosclerotic changes of the abdominal aorta. Mild atherosclerotic changes of the bilateral iliac arteries. Proximal femoral arteries are patent. Tunneled left femoral dialysis catheter. Multiple lymph nodes in the retroperitoneum with associated edema. This is favored to be  secondary to the venous congestion of the patient's known IVC and iliac vein occlusions. Surgical changes of bilateral femoral loop grafts, with surgical changes of prior excision on the right. Reproductive: Unremarkable prostate Other: Low-density cystic structure in the abdominal wall measures 5.7 cm with some rim calcification. This is secondary to a prior peritoneal dialysis catheter. Rim calcified cystic structure within the mesentery measures 5.8 cm, likely a pseudocyst related to prior peritoneal dialysis. Partially calcified structure in the right lower pelvis, compatible with failed renal transplant. Musculoskeletal: Changes of renal osteodystrophy. No acute displaced fracture. No bony canal narrowing. Unremarkable hips. IMPRESSION: No acute CT finding. Poor opacification of the hepatic and portal venous vasculature secondary to the timing of the contrast bolus. However, there are no secondary signs of hepatic vein occlusion identified in the liver. These above preliminary results were discussed by telephone at the time of interpretation on 01/29/2023 at 4:38 pm with Dr. Heath Lark. 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 Aortic Atherosclerosis (ICD10-I70.0). Additional ancillary findings as above. Signed, Marijean Niemann  Kenna Gilbert, DO, ABVM, RPVI [facility] Radiology Electronically Signed   By: Gilmer Mor D.O.   On: 01/29/2023 16:39   IR Fluoro Guide CV Line Left  Result Date: 01/29/2023 INDICATION: Bacteremia. Malfunctioning left common femoral vein approach hemodialysis catheter. Patient with longstanding history of end-stage renal disease renal disease with extremely limited dialysis catheter access, currently receiving intermittently successful dialysis from a chronic left common femoral approach hemodialysis catheter,  initially placed by the interventional radiology service on 04/23/2022, by report, most recently exchanged by the vascular surgery service approximately 1 month ago. The patient presents today for fluoroscopic guided dialysis catheter exchange. EXAM: TUNNELED CENTRAL VENOUS HEMODIALYSIS CATHETER REPLACEMENT WITH FLUOROSCOPIC GUIDANCE MEDICATIONS: None CONTRAST:  25 cc Omnipaque 300 FLUOROSCOPY TIME:  367 mGy COMPLICATIONS: None immediate. PROCEDURE: Informed written consent was obtained from the patient after a discussion of the risks, benefits, and alternatives to treatment. Questions regarding the procedure were encouraged and answered. The skin and external portion of the existing hemodialysis catheter was prepped with chlorhexidine in a sterile fashion, and a sterile drape was applied covering the operative field. Maximum barrier sterile technique with sterile gowns and gloves were used for the procedure. A timeout was performed prior to the initiation of the procedure. Preprocedural spot fluoroscopic image was obtained of the left pelvis and left common femoral vein approach hemodialysis catheter. The hemodialysis catheter was noted to be located within an atypical positioned, to the left of the L5 vertebral body. Neither lumen of the pre-existing hemodialysis catheter were able to be aspirated. Pull-back venogram performed via the pre-existing hemodialysis catheter demonstrates chronic occlusion of both the IVC as well as the pelvic venous system with filling of multiple para lumbar venous collaterals. Next, two stiff glide wires were advanced through both lumens of the dialysis catheter and exchanged for a new, 23 cm tip to cuff hemodialysis catheter. After catheter exchange, neither lumen was noted to aspirate. As such, the catheter was slightly retracted to the level of the left external iliac vein where some blood return was noted. The dialysis catheter was secured in place at this location. The patient  tolerated the procedure well without immediate post procedural complication. IMPRESSION: Technically successful fluoroscopic guided exchange and repositioning of 23 cm tip to cuff tunneled left common femoral vein approach hemodialysis catheter with tip terminating within the left external iliac vein. Neither lumen of the exchanged dialysis catheter was able to be easily aspirated however the pre-existing catheter demonstrated similar functionality and reportedly has been serving as a source of intermittent successful dialysis and as such the exchanged catheter was left in place for attempted dialysis. PLAN: If this exchanged dialysis catheter does NOT serve as a durable source of successful dialysis, agree with evaluation for consideration of trans hepatic dialysis catheter placement as patient has exhausted all other options for dialysis access. Electronically Signed   By: Simonne Come M.D.   On: 01/29/2023 16:14   IR Veno/Ext/Uni Left  Result Date: 01/29/2023 INDICATION: Bacteremia. Malfunctioning left common femoral vein approach hemodialysis catheter. Patient with longstanding history of end-stage renal disease renal disease with extremely limited dialysis catheter access, currently receiving intermittently successful dialysis from a chronic left common femoral approach hemodialysis catheter, initially placed by the interventional radiology service on 04/23/2022, by report, most recently exchanged by the vascular surgery service approximately 1 month ago. The patient presents today for fluoroscopic guided dialysis catheter exchange. EXAM: TUNNELED CENTRAL VENOUS HEMODIALYSIS CATHETER REPLACEMENT WITH FLUOROSCOPIC GUIDANCE MEDICATIONS: None CONTRAST:  25 cc Omnipaque 300 FLUOROSCOPY TIME:  367 mGy COMPLICATIONS: None immediate. PROCEDURE: Informed written consent was obtained from the patient after a discussion of the risks, benefits, and alternatives to treatment. Questions regarding the procedure were  encouraged and answered. The skin and external portion of the existing hemodialysis catheter was prepped with chlorhexidine in a sterile fashion, and a sterile drape was applied covering the operative field. Maximum barrier sterile technique with sterile gowns and gloves were used for the procedure. A timeout was performed prior to the initiation of the procedure. Preprocedural spot fluoroscopic image was obtained of the left pelvis and left common femoral vein approach hemodialysis catheter. The hemodialysis catheter was noted to be located within an atypical positioned, to the left of the L5 vertebral body. Neither lumen of the pre-existing hemodialysis catheter were able to be aspirated. Pull-back venogram performed via the pre-existing hemodialysis catheter demonstrates chronic occlusion of both the IVC as well as the pelvic venous system with filling of multiple para lumbar venous collaterals. Next, two stiff glide wires were advanced through both lumens of the dialysis catheter and exchanged for a new, 23 cm tip to cuff hemodialysis catheter. After catheter exchange, neither lumen was noted to aspirate. As such, the catheter was slightly retracted to the level of the left external iliac vein where some blood return was noted. The dialysis catheter was secured in place at this location. The patient tolerated the procedure well without immediate post procedural complication. IMPRESSION: Technically successful fluoroscopic guided exchange and repositioning of 23 cm tip to cuff tunneled left common femoral vein approach hemodialysis catheter with tip terminating within the left external iliac vein. Neither lumen of the exchanged dialysis catheter was able to be easily aspirated however the pre-existing catheter demonstrated similar functionality and reportedly has been serving as a source of intermittent successful dialysis and as such the exchanged catheter was left in place for attempted dialysis. PLAN: If this  exchanged dialysis catheter does NOT serve as a durable source of successful dialysis, agree with evaluation for consideration of trans hepatic dialysis catheter placement as patient has exhausted all other options for dialysis access. Electronically Signed   By: Simonne Come M.D.   On: 01/29/2023 16:14   Medications:  sodium chloride Stopped (01/30/23 2358)    ceFAZolin (ANCEF) IV Stopped (01/30/23 2358)   heparin 2,600 Units/hr (01/31/23 0546)    (feeding supplement) PROSource Plus  30 mL Oral BID BM   Chlorhexidine Gluconate Cloth  6 each Topical Daily   Chlorhexidine Gluconate Cloth  6 each Topical Q0600   darbepoetin (ARANESP) injection - DIALYSIS  150 mcg Subcutaneous Q Thu-1800   ferric citrate  630 mg Oral TID WC   metroNIDAZOLE  500 mg Oral Q12H   midodrine  15 mg Oral TID WC   sodium zirconium cyclosilicate  10 g Oral BID   vancomycin variable dose per unstable renal function (pharmacist dosing)   Does not apply See admin instructions

## 2023-01-31 NOTE — Progress Notes (Signed)
PROGRESS NOTE  [name]  ZHY:[id number] DOB: 11-Mar-1976 DOA: 01/27/2023 PCP: [name], MD   Brief Narrative: Patient is a 47 year old male with history of ESRD, failed renal transplant, right lower extremity osteomyelitis status post first ray transmetatarsal amputation who was brought from outpatient dialysis center for further evaluation of hypotension.  He was recently treated with prolonged course of antibiotics for right lower extremity osteomyelitis .patient was also having thick drainage from his left femoral tunneled catheter for 3 days.  Patient was suspected to have septic shock and admitted under PCCM service, started on pressors.  Blood cultures showed proteus.  ID, nephrology, vascular surgery following.  Patient transferred to [facility] service on 8/22.  Plan for transhepatic catheter placement for dialysis on Monday  Assessment & Plan:  Active Problems:   Paroxysmal atrial fibrillation (HCC)   Septic shock (HCC)   Chronic anticoagulation   Gram-negative bacteremia   ESRD on hemodialysis (HCC)   Non-healing wound of lower extremity   Chronic osteomyelitis of right foot with draining sinus (HCC)   Septic shock/gram-negative bacteremia/HD line infection: Presented with hypotension.  Patient was also having thick drainage from his left femoral tunneled catheter for 3 days.  Patient was suspected to have septic shock and admitted under PCCM service, started on pressors.  Blood cultures showing Proteus.  Currently on ceftriaxone, Vanco, Flagyl.  Continue follow-up final culture report.  Repeat blood cultures have been negative Sepsis physiology has resolved, lactic acidosis has resolved .currently blood pressure stable.  Patient has chronic hypotension and is on midodrine.  ESRD: Nephrology following.  Concern for HD line infection.  Patient has access in the left femoral area.  Complicated vascular access situation.  Vascular surgery following,he was  planned for graft  placement in few weeks.  Currently he has no options for tunneled dialysis catheter placement.  Vascular surgery discussed with IR.Underwent  fluoroscopy guided exchange of left femoral hemodialysis catheter by IR on 8/22.now the plan is for transhepatic catheter placement as current catheter is not working.We will continue phosphate binders, has mild hyperkalemia.  Continue Lokelma  Recent right lower extremity osteomyelitis: Status post first ray amputation.  MRI recommended by ID,now shows changes of osteomyelitis.  X-ray without periosteal changes.  Continue current antibiotics.ID following.  Podiatry following  Hyponatremia: Likely from volume overload.  Volume management as per dialysis  Anemia of chronic disease: Continue to monitor hemoglobin.  Transfuse if hemoglobin drops less than 7  Paroxysmal A-fib: Currently in normal sinus rhythm.  Monitor on telemetry.  Currently Eliquis is on hold.  On heparin drip for possible upcoming procedures  Hyperglycemia: A1c of 5.9 as per 6/24.  Monitor blood sugars  Morbid obesity: BMI 40       DVT prophylaxis:iv heparin     Code Status: Full Code  Family Communication: called and discussed with mother Darl Pikes on phone on 8/23  Patient status:Inpatient  Patient is from :home  Anticipated discharge EX:[facility]  Estimated DC date:not sure   Consultants: ID, PCCM, nephrology, vascular surgery  Procedures: Dialysis, exchange of dialysis catheter  Antimicrobials:  Anti-infectives (From admission, onward)    Start     Dose/Rate Route Frequency Ordered Stop   01/30/23 2200  ceFAZolin (ANCEF) IVPB 1 g/50 mL premix        1 g 100 mL/hr over 30 Minutes Intravenous Every 24 hours 01/30/23 1413     01/29/23 2200  metroNIDAZOLE (FLAGYL) tablet 500 mg        500 mg Oral Every  12 hours 01/29/23 1509     01/29/23 2200  cefTRIAXone (ROCEPHIN) 2 g in sodium chloride 0.9 % 100 mL IVPB  Status:  Discontinued        2 g 200 mL/hr over 30 Minutes  Intravenous Every 24 hours 01/29/23 1548 01/30/23 1413   01/29/23 1549  vancomycin variable dose per unstable renal function (pharmacist dosing)         Does not apply See admin instructions 01/29/23 1549     01/29/23 0600  ceFEPIme (MAXIPIME) 1 g in sodium chloride 0.9 % 100 mL IVPB  Status:  Discontinued        1 g 200 mL/hr over 30 Minutes Intravenous Every 24 hours 01/28/23 1006 01/29/23 1548   01/28/23 1600  ceFEPIme (MAXIPIME) 1 g in sodium chloride 0.9 % 100 mL IVPB  Status:  Discontinued        1 g 200 mL/hr over 30 Minutes Intravenous Every 24 hours 01/27/23 1715 01/27/23 1759   01/28/23 0400  ceFEPIme (MAXIPIME) 2 g in sodium chloride 0.9 % 100 mL IVPB  Status:  Discontinued        2 g 200 mL/hr over 30 Minutes Intravenous Every 12 hours 01/27/23 1759 01/28/23 1006   01/27/23 1811  vancomycin variable dose per unstable renal function (pharmacist dosing)  Status:  Discontinued         Does not apply See admin instructions 01/27/23 1811 01/28/23 0959   01/27/23 1445  metroNIDAZOLE (FLAGYL) IVPB 500 mg  Status:  Discontinued        500 mg 100 mL/hr over 60 Minutes Intravenous Every 12 hours 01/27/23 1434 01/28/23 1010   01/27/23 1445  ceFEPIme (MAXIPIME) 1 g in sodium chloride 0.9 % 100 mL IVPB        1 g 200 mL/hr over 30 Minutes Intravenous  Once 01/27/23 1440 01/27/23 1618   01/27/23 1115  cefTRIAXone (ROCEPHIN) 2 g in sodium chloride 0.9 % 100 mL IVPB        2 g 200 mL/hr over 30 Minutes Intravenous  Once 01/27/23 1101 01/27/23 1212   01/27/23 1115  vancomycin (VANCOCIN) 2,500 mg in sodium chloride 0.9 % 500 mL IVPB        2,500 mg 262.5 mL/hr over 120 Minutes Intravenous  Once 01/27/23 1108 01/27/23 1534       Subjective: Patient seen and examined at bedside today.  Hemodynamically stable.  Lying in bed.  No new complaints today.  Overall looks comfortable.  Does not want to talk much.  Denies any pain on the foot  Objective: Vitals:   01/30/23 2050 01/31/23 0501  01/31/23 0715 01/31/23 0805  BP: 115/79 115/72  97/65  Pulse: 70 77  81  Resp: 17 17  18   Temp: 98.7 F (37.1 C) 98.5 F (36.9 C)  98.2 F (36.8 C)  TempSrc:  Oral    SpO2: 96% 96%  92%  Weight:   (!) 143.2 kg   Height:        Intake/Output Summary (Last 24 hours) at 01/31/2023 1125 Last data filed at 01/31/2023 0758 Gross per 24 hour  Intake 895.21 ml  Output 0 ml  Net 895.21 ml   Filed Weights   01/30/23 0757 01/30/23 0847 01/31/23 0715  Weight: (!) 141.8 kg (!) 141.8 kg (!) 143.2 kg    Examination:   General exam: Overall comfortable, not in distress, obese HEENT: PERRL Respiratory system:  no wheezes or crackles  Cardiovascular system: S1 & S2 heard,  RRR.  Gastrointestinal system: Abdomen is nondistended, soft and nontender. Central nervous system: Alert and oriented Extremities: Femoral dialysis catheter on the left groin, first amputation of the right foot with gangrenous changes Skin: No rashes, no ulcers,no icterus     Data Reviewed: I have personally reviewed following labs and imaging studies  CBC: Recent Labs  Lab 01/27/23 1114 01/28/23 0630 01/29/23 0833 01/30/23 0505 01/31/23 0818  WBC 5.6 9.2 9.4 11.1* 8.9  NEUTROABS 4.4  --   --   --   --   HGB 8.5* 9.5* 8.0* 8.2* 7.9*  HCT 26.8* 29.9* 24.9* 25.5* 24.4*  MCV 86.7 88.7 86.2 84.7 84.4  PLT 214 227 271 303 306   Basic Metabolic Panel: Recent Labs  Lab 01/27/23 2140 01/28/23 0630 01/30/23 0505 01/30/23 0722 01/31/23 0818  NA 132* 130* 130* 131* 130*  K 5.5* 5.9* 5.9* 5.5* 5.2*  CL 95* 94* 95* 96* 95*  CO2 21* 16* 17* 15* 14*  GLUCOSE 106* 103* 85 85 107*  BUN 92* 99* 102* 107* 111*  CREATININE 20.17* 19.76* 20.48* 20.75* 22.18*  CALCIUM 8.4* 8.6* 8.2* 8.3* 8.3*  MG  --  2.1  --   --   --   PHOS  --   --   --  6.3*  --      Recent Results (from the past 240 hour(s))  Culture, blood (Routine x 2)     Status: Abnormal   Collection Time: 01/27/23 11:00 AM   Specimen: BLOOD  Result  Value Ref Range Status   Specimen Description BLOOD RIGHT ANTECUBITAL  Final   Special Requests   Final    BOTTLES DRAWN AEROBIC AND ANAEROBIC Blood Culture adequate volume   Culture  Setup Time   Final    GRAM NEGATIVE RODS IN BOTH AEROBIC AND ANAEROBIC BOTTLES CRITICAL RESULT CALLED TO, READ BACK BY AND VERIFIED WITH: V BRYK,[id number]  01/28/23 MK Performed at [facility], [address] [place], [place] [address]    Culture PROTEUS MIRABILIS (A)  Final   Report Status 01/30/2023 FINAL  Final   Organism ID, Bacteria PROTEUS MIRABILIS  Final      Susceptibility   Proteus mirabilis - MIC*    AMPICILLIN <=2 SENSITIVE Sensitive     CEFEPIME <=0.12 SENSITIVE Sensitive     CEFTAZIDIME <=1 SENSITIVE Sensitive     CEFTRIAXONE <=0.25 SENSITIVE Sensitive     CIPROFLOXACIN <=0.25 SENSITIVE Sensitive     GENTAMICIN <=1 SENSITIVE Sensitive     IMIPENEM 2 SENSITIVE Sensitive     TRIMETH/SULFA <=20 SENSITIVE Sensitive     AMPICILLIN/SULBACTAM <=2 SENSITIVE Sensitive     PIP/TAZO <=4 SENSITIVE Sensitive     * PROTEUS MIRABILIS  Blood Culture ID Panel (Reflexed)     Status: Abnormal   Collection Time: 01/27/23 11:00 AM  Result Value Ref Range Status   Enterococcus faecalis NOT DETECTED NOT DETECTED Final   Enterococcus Faecium NOT DETECTED NOT DETECTED Final   Listeria monocytogenes NOT DETECTED NOT DETECTED Final   Staphylococcus species NOT DETECTED NOT DETECTED Final   Staphylococcus aureus (BCID) NOT DETECTED NOT DETECTED Final   Staphylococcus epidermidis NOT DETECTED NOT DETECTED Final   Staphylococcus lugdunensis NOT DETECTED NOT DETECTED Final   Streptococcus species NOT DETECTED NOT DETECTED Final   Streptococcus agalactiae NOT DETECTED NOT DETECTED Final   Streptococcus pneumoniae NOT DETECTED NOT DETECTED Final   Streptococcus pyogenes NOT DETECTED NOT DETECTED Final   A.calcoaceticus-baumannii NOT DETECTED NOT DETECTED Final  Bacteroides fragilis NOT DETECTED NOT  DETECTED Final   Enterobacterales DETECTED (A) NOT DETECTED Final    Comment: Enterobacterales represent a large order of gram negative bacteria, not a single organism. CRITICAL RESULT CALLED TO, READ BACK BY AND VERIFIED WITH: V BRYK,PHARMD@[id number]  01/28/23 MK    Enterobacter cloacae complex NOT DETECTED NOT DETECTED Final   Escherichia coli NOT DETECTED NOT DETECTED Final   Klebsiella aerogenes NOT DETECTED NOT DETECTED Final   Klebsiella oxytoca NOT DETECTED NOT DETECTED Final   Klebsiella pneumoniae NOT DETECTED NOT DETECTED Final   Proteus species DETECTED (A) NOT DETECTED Final    Comment: CRITICAL RESULT CALLED TO, READ BACK BY AND VERIFIED WITH: V BRYK,PHARMD@[id number]  01/28/23 MK    Salmonella species NOT DETECTED NOT DETECTED Final   Serratia marcescens NOT DETECTED NOT DETECTED Final   Haemophilus influenzae NOT DETECTED NOT DETECTED Final   Neisseria meningitidis NOT DETECTED NOT DETECTED Final   Pseudomonas aeruginosa NOT DETECTED NOT DETECTED Final   Stenotrophomonas maltophilia NOT DETECTED NOT DETECTED Final   Candida albicans NOT DETECTED NOT DETECTED Final   Candida auris NOT DETECTED NOT DETECTED Final   Candida glabrata NOT DETECTED NOT DETECTED Final   Candida krusei NOT DETECTED NOT DETECTED Final   Candida parapsilosis NOT DETECTED NOT DETECTED Final   Candida tropicalis NOT DETECTED NOT DETECTED Final   Cryptococcus neoformans/gattii NOT DETECTED NOT DETECTED Final   CTX-M ESBL NOT DETECTED NOT DETECTED Final   Carbapenem resistance IMP NOT DETECTED NOT DETECTED Final   Carbapenem resistance KPC NOT DETECTED NOT DETECTED Final   Carbapenem resistance NDM NOT DETECTED NOT DETECTED Final   Carbapenem resist OXA 48 LIKE NOT DETECTED NOT DETECTED Final   Carbapenem resistance VIM NOT DETECTED NOT DETECTED Final    Comment: Performed at [facility], [address] [place], [place] [address]  SARS Coronavirus 2 by RT PCR (hospital order, performed in [facility]  hospital lab) *cepheid single result test* Anterior Nasal Swab     Status: None   Collection Time: 01/27/23 11:42 AM   Specimen: Anterior Nasal Swab  Result Value Ref Range Status   SARS Coronavirus 2 by RT PCR NEGATIVE NEGATIVE Final    Comment: Performed at [facility], [address] [place], [place] [address]  Culture, blood (Routine x 2)     Status: None (Preliminary result)   Collection Time: 01/27/23 11:50 AM   Specimen: BLOOD RIGHT HAND  Result Value Ref Range Status   Specimen Description BLOOD RIGHT HAND  Final   Special Requests   Final    BOTTLES DRAWN AEROBIC AND ANAEROBIC Blood Culture adequate volume   Culture   Final    NO GROWTH 4 DAYS Performed at [facility], [address], [place], [place] [address]    Report Status PENDING  Incomplete  MRSA Next Gen by PCR, Nasal     Status: None   Collection Time: 01/27/23  3:09 PM   Specimen: Nasal Mucosa; Nasal Swab  Result Value Ref Range Status   MRSA by PCR Next Gen NOT DETECTED NOT DETECTED Final    Comment: (NOTE) The GeneXpert MRSA Assay (FDA approved for NASAL specimens only), is one component of a comprehensive MRSA colonization surveillance program. It is not intended to diagnose MRSA infection nor to guide or monitor treatment for MRSA infections. Test performance is not FDA approved in patients less than 77 years old. Performed at [facility], [address] [place],  [place] [address]   Culture, blood (Routine X 2) w Reflex to ID Panel     Status: None (Preliminary result)   Collection Time: 01/29/23  8:33 AM   Specimen: BLOOD LEFT ARM  Result Value Ref Range Status   Specimen Description BLOOD LEFT ARM  Final   Special Requests   Final    BOTTLES DRAWN AEROBIC ONLY Blood Culture adequate volume   Culture   Final    NO GROWTH 2 DAYS Performed at [facility], [address], [place], [place] [address]    Report Status PENDING  Incomplete  Culture, blood (Routine X 2) w Reflex  to ID Panel     Status: None (Preliminary result)   Collection Time: 01/29/23  8:35 AM   Specimen: BLOOD RIGHT HAND  Result Value Ref Range Status   Specimen Description BLOOD RIGHT HAND  Final   Special Requests   Final    BOTTLES DRAWN AEROBIC ONLY Blood Culture adequate volume   Culture   Final    NO GROWTH 2 DAYS Performed at [facility], [address] [place], [place] [address]    Report Status PENDING  Incomplete     Radiology Studies: CT ABDOMEN PELVIS W CONTRAST  Result Date: 01/29/2023 CLINICAL DATA:  47 year old male with a history of renal failure, difficult access, possible transhepatic catheter. EXAM: CT ABDOMEN AND PELVIS WITH CONTRAST TECHNIQUE: Multidetector CT imaging of the abdomen and pelvis was performed using the standard protocol following bolus administration of intravenous contrast. RADIATION DOSE REDUCTION: This exam was performed according to the departmental dose-optimization program which includes automated exposure control, adjustment of the mA and/or kV according to patient size and/or use of iterative reconstruction technique. CONTRAST:  75mL OMNIPAQUE IOHEXOL 350 MG/ML SOLN COMPARISON:  04/14/2011 FINDINGS: Lower chest: No acute finding of the lower chest. Hepatobiliary: Timing of the contrast bolus is delayed secondary to collateral venous drainage centrally. Hepatomegaly. No focal lesion. Focal fatty infiltration adjacent to the falciform ligament. Gallbladder is decompressed. Pancreas: Unremarkable Spleen: Unremarkable Adrenals/Urinary Tract: - Right adrenal gland:  Unremarkable - Left adrenal gland: Unremarkable. - Right kidney: Atrophic right kidney with nephrolithiasis. No hydronephrosis. - Left Kidney: Atrophic left kidney with nephrolithiasis. No hydronephrosis. - Urinary Bladder: Urinary bladder is decompressed. Stomach/Bowel: - Stomach: Unremarkable. - Small bowel: Unremarkable - Appendix: Normal - Colon: Colon decompressed. Colonic diverticula of  the left colon. No acute inflammatory changes. Vascular/Lymphatic: Calcifications of the coronary arteries of the visualized heart. Partial opacification of collateral draining veins of the left chest. Hepatic veins not well opacified given the timing of the contrast bolus. Portal veins not well opacified given the timing of the contrast bolus. Mild atherosclerotic changes of the abdominal aorta. Mild atherosclerotic changes of the bilateral iliac arteries. Proximal femoral arteries are patent. Tunneled left femoral dialysis catheter. Multiple lymph nodes in the retroperitoneum with associated edema. 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 Surgical changes of bilateral femoral loop grafts, with surgical changes of prior excision on the right. Reproductive: Unremarkable prostate Other: Low-density cystic structure in the abdominal wall measures 5.7 cm with some rim calcification. This is secondary to a prior peritoneal dialysis catheter. Rim calcified cystic structure within the mesentery measures 5.8 cm, likely a pseudocyst related to prior peritoneal dialysis. Partially calcified structure in the right lower pelvis, compatible with failed renal transplant. Musculoskeletal: Changes of renal osteodystrophy. No acute displaced fracture. No bony canal  narrowing. Unremarkable hips. IMPRESSION: No acute CT finding. Poor opacification of the hepatic and portal venous vasculature secondary to the timing of the contrast bolus. However, there are no secondary signs of hepatic vein occlusion identified in the liver. These above preliminary results were discussed by telephone at the time of interpretation on 01/29/2023 at 4:38 pm with Dr. Heath Lark. 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 Aortic Atherosclerosis  (ICD10-I70.0). Additional ancillary findings as above. Signed, Yvone Neu. Miachel Roux, RPVI [facility] Radiology Electronically Signed   By: Gilmer Mor D.O.   On: 01/29/2023 16:39   IR Fluoro Guide CV Line Left  Result Date: 01/29/2023 INDICATION: Bacteremia. Malfunctioning left common femoral vein approach hemodialysis catheter. Patient with longstanding history of end-stage renal disease renal disease with extremely limited dialysis catheter access, currently receiving intermittently successful dialysis from a chronic left common femoral approach hemodialysis catheter, initially placed by the interventional radiology service on 04/23/2022, by report, most recently exchanged by the vascular surgery service approximately 1 month ago. The patient presents today for fluoroscopic guided dialysis catheter exchange. EXAM: TUNNELED CENTRAL VENOUS HEMODIALYSIS CATHETER REPLACEMENT WITH FLUOROSCOPIC GUIDANCE MEDICATIONS: None CONTRAST:  25 cc Omnipaque 300 FLUOROSCOPY TIME:  367 mGy COMPLICATIONS: None immediate. PROCEDURE: Informed written consent was obtained from the patient after a discussion of the risks, benefits, and alternatives to treatment. Questions regarding the procedure were encouraged and answered. The skin and external portion of the existing hemodialysis catheter was prepped with chlorhexidine in a sterile fashion, and a sterile drape was applied covering the operative field. Maximum barrier sterile technique with sterile gowns and gloves were used for the procedure. A timeout was performed prior to the initiation of the procedure. Preprocedural spot fluoroscopic image was obtained of the left pelvis and left common femoral vein approach hemodialysis catheter. The hemodialysis catheter was noted to be located within an atypical positioned, to the left of the L5 vertebral body. Neither lumen of the pre-existing hemodialysis catheter were able to be  aspirated. Pull-back venogram performed via the pre-existing hemodialysis catheter demonstrates chronic occlusion of both the IVC as well as the pelvic venous system with filling of multiple para lumbar venous collaterals. Next, two stiff glide wires were advanced through both lumens of the dialysis catheter and exchanged for a new, 23 cm tip to cuff hemodialysis catheter. After catheter exchange, neither lumen was noted to aspirate. As such, the catheter was slightly retracted to the level of the left external iliac vein where some blood return was noted. The dialysis catheter was secured in place at this location. The patient tolerated the procedure well without immediate post procedural complication. IMPRESSION: Technically successful fluoroscopic guided exchange and repositioning of 23 cm tip to cuff tunneled left common femoral vein approach hemodialysis catheter with tip terminating within the left external iliac vein. Neither lumen of the exchanged dialysis catheter was able to be easily aspirated however the pre-existing catheter demonstrated similar functionality and reportedly has been serving as a source of intermittent successful dialysis and as such the exchanged catheter was left in place for attempted dialysis. PLAN: If this exchanged dialysis catheter does NOT serve as a durable source of successful dialysis, agree with evaluation for consideration of trans hepatic dialysis catheter placement as patient has exhausted all other options for dialysis access. Electronically Signed   By: Simonne Come M.D.   On: 01/29/2023  16:14   IR Veno/Ext/Uni Left  Result Date: 01/29/2023 INDICATION: Bacteremia. Malfunctioning left common femoral vein approach hemodialysis catheter. Patient with longstanding history of end-stage renal disease renal disease with extremely limited dialysis catheter access, currently receiving intermittently successful dialysis from a chronic left common femoral approach hemodialysis  catheter, initially placed by the interventional radiology service on 04/23/2022, by report, most recently exchanged by the vascular surgery service approximately 1 month ago. The patient presents today for fluoroscopic guided dialysis catheter exchange. EXAM: TUNNELED CENTRAL VENOUS HEMODIALYSIS CATHETER REPLACEMENT WITH FLUOROSCOPIC GUIDANCE MEDICATIONS: None CONTRAST:  25 cc Omnipaque 300 FLUOROSCOPY TIME:  367 mGy COMPLICATIONS: None immediate. PROCEDURE: Informed written consent was obtained from the patient after a discussion of the risks, benefits, and alternatives to treatment. Questions regarding the procedure were encouraged and answered. The skin and external portion of the existing hemodialysis catheter was prepped with chlorhexidine in a sterile fashion, and a sterile drape was applied covering the operative field. Maximum barrier sterile technique with sterile gowns and gloves were used for the procedure. A timeout was performed prior to the initiation of the procedure. Preprocedural spot fluoroscopic image was obtained of the left pelvis and left common femoral vein approach hemodialysis catheter. The hemodialysis catheter was noted to be located within an atypical positioned, to the left of the L5 vertebral body. Neither lumen of the pre-existing hemodialysis catheter were able to be aspirated. Pull-back venogram performed via the pre-existing hemodialysis catheter demonstrates chronic occlusion of both the IVC as well as the pelvic venous system with filling of multiple para lumbar venous collaterals. Next, two stiff glide wires were advanced through both lumens of the dialysis catheter and exchanged for a new, 23 cm tip to cuff hemodialysis catheter. After catheter exchange, neither lumen was noted to aspirate. As such, the catheter was slightly retracted to the level of the left external iliac vein where some blood return was noted. The dialysis catheter was secured in place at this location. The  patient tolerated the procedure well without immediate post procedural complication. IMPRESSION: Technically successful fluoroscopic guided exchange and repositioning of 23 cm tip to cuff tunneled left common femoral vein approach hemodialysis catheter with tip terminating within the left external iliac vein. Neither lumen of the exchanged dialysis catheter was able to be easily aspirated however the pre-existing catheter demonstrated similar functionality and reportedly has been serving as a source of intermittent successful dialysis and as such the exchanged catheter was left in place for attempted dialysis. PLAN: If this exchanged dialysis catheter does NOT serve as a durable source of successful dialysis, agree with evaluation for consideration of trans hepatic dialysis catheter placement as patient has exhausted all other options for dialysis access. Electronically Signed   By: Simonne Come M.D.   On: 01/29/2023 16:14    Scheduled Meds:  (feeding supplement) PROSource Plus  30 mL Oral BID BM   Chlorhexidine Gluconate Cloth  6 each Topical Daily   Chlorhexidine Gluconate Cloth  6 each Topical Q0600   darbepoetin (ARANESP) injection - DIALYSIS  150 mcg Subcutaneous Q Thu-1800   ferric citrate  630 mg Oral TID WC   metroNIDAZOLE  500 mg Oral Q12H   midodrine  15 mg Oral TID WC   sodium bicarbonate  1,300 mg Oral TID   sodium zirconium cyclosilicate  10 g Oral BID   vancomycin variable dose per unstable renal function (pharmacist dosing)   Does not apply See admin instructions   Continuous Infusions:  sodium chloride Stopped (01/30/23  2358)    ceFAZolin (ANCEF) IV Stopped (01/30/23 2358)   heparin 2,600 Units/hr (01/31/23 0546)     LOS: 4 days   Burnadette Pop, MD [facility] P8/24/2024, 11:25 AM

## 2023-01-31 NOTE — Progress Notes (Signed)
  Subjective:  Patient ID: [name], male    DOB: 07/03/1975,  MRN: [id number]  Patient seen at bedside.  Resting comfortably.  Says he feels fine   Negative for chest pain and shortness of breath Fever: no Night sweats: no Constitutional signs: no Review of all other systems is negative Objective:   Vitals:   01/31/23 0501 01/31/23 0805  BP: 115/72 97/65  Pulse: 77 81  Resp: 17 18  Temp: 98.5 F (36.9 C) 98.2 F (36.8 C)  SpO2: 96% 92%   General AA&O x3. Normal mood and affect.  Vascular Right foot warm well-perfused  Neurologic Epicritic sensation grossly reduced.  Dermatologic Chronic open ulcer with serous drainage right foot with granular wound bed, edema and heat to foot  Orthopedic: MMT 5/5 in dorsiflexion, plantarflexion, inversion, and eversion. Normal joint ROM without pain or crepitus.    Assessment & Plan:  Patient was evaluated and treated and all questions answered.  Osteomyelitis right foot -I discussed again the prognosis of his right foot.  There is plans for a transhepatic catheter placement on Monday.  My concern with proceeding with this prior to further resection and debridement of the right foot infection is at that catheter likely is at risk of getting infected with transient bacteremia.  I recommended to the patient that resection of the infected portions of the bone, bone culture and biopsy and debridement of the wound with a skin substitute application and wound VAC would be necessary to save his foot.  His affect is similar to yesterday and still says he does not really care what happens 1 or the other since he is slowly dying.  I ask him if he has any objections to proceeding with surgery and he says no.  When asked if he wants surgery he says he does not really care 1 way or the other. -Tentatively will plan for OR tomorrow n.p.o. past midnight -I again asked the patient if there is anyone he would like me to discuss this with such as his  mother and he said no -Discussed his case with hospitalist and nursing staff as well and they report similar interactions.  I recommended a consult to spiritual care and chaplain to see if they are able to provide any support that he would be willing to engage with [place] further. -Understandably he is depressed and frustrated with his many chronic conditions and major health changes in the last several years, but I would like him to be able to give a more significant affirmative for consent for surgery prior to proceeding.  Will continue discuss with him  Edwin Cap, DPM  Accessible via secure chat for questions or concerns.

## 2023-02-01 ENCOUNTER — Encounter (HOSPITAL_COMMUNITY): Payer: Self-pay | Admitting: Pulmonary Disease

## 2023-02-01 ENCOUNTER — Encounter (HOSPITAL_COMMUNITY)
Admission: EM | Disposition: A | Discharge status: Home Health | Payer: Self-pay | Source: Home / Self Care | Attending: Internal Medicine

## 2023-02-01 ENCOUNTER — Inpatient Hospital Stay (HOSPITAL_COMMUNITY): Payer: Medicare HMO | Admitting: Anesthesiology

## 2023-02-01 DIAGNOSIS — Z992 Dependence on renal dialysis: Secondary | ICD-10-CM

## 2023-02-01 DIAGNOSIS — I132 Hypertensive heart and chronic kidney disease with heart failure and with stage 5 chronic kidney disease, or end stage renal disease: Secondary | ICD-10-CM

## 2023-02-01 DIAGNOSIS — L089 Local infection of the skin and subcutaneous tissue, unspecified: Secondary | ICD-10-CM

## 2023-02-01 DIAGNOSIS — M86471 Chronic osteomyelitis with draining sinus, right ankle and foot: Secondary | ICD-10-CM | POA: Diagnosis not present

## 2023-02-01 DIAGNOSIS — N186 End stage renal disease: Secondary | ICD-10-CM

## 2023-02-01 DIAGNOSIS — I509 Heart failure, unspecified: Secondary | ICD-10-CM

## 2023-02-01 DIAGNOSIS — S81801A Unspecified open wound, right lower leg, initial encounter: Secondary | ICD-10-CM | POA: Diagnosis not present

## 2023-02-01 DIAGNOSIS — R7881 Bacteremia: Secondary | ICD-10-CM | POA: Diagnosis not present

## 2023-02-01 HISTORY — PX: I & D EXTREMITY: SHX5045

## 2023-02-01 HISTORY — PX: APPLICATION OF WOUND VAC: SHX5189

## 2023-02-01 HISTORY — PX: ALLOGRAFT APPLICATION: SHX6404

## 2023-02-01 LAB — CBC
HCT: 25.5 % — ABNORMAL LOW (ref 39.0–52.0)
Hemoglobin: 8.3 g/dL — ABNORMAL LOW (ref 13.0–17.0)
MCH: 28 pg (ref 26.0–34.0)
MCHC: 32.5 g/dL (ref 30.0–36.0)
MCV: 86.1 fL (ref 80.0–100.0)
Platelets: 316 10*3/uL (ref 150–400)
RBC: 2.96 MIL/uL — ABNORMAL LOW (ref 4.22–5.81)
RDW: 18.6 % — ABNORMAL HIGH (ref 11.5–15.5)
WBC: 8.4 10*3/uL (ref 4.0–10.5)
nRBC: 0 % (ref 0.0–0.2)

## 2023-02-01 LAB — HEPARIN LEVEL (UNFRACTIONATED): Heparin Unfractionated: 0.43 [IU]/mL (ref 0.30–0.70)

## 2023-02-01 LAB — RENAL FUNCTION PANEL
Albumin: 2.8 g/dL — ABNORMAL LOW (ref 3.5–5.0)
Anion gap: 24 — ABNORMAL HIGH (ref 5–15)
BUN: 116 mg/dL — ABNORMAL HIGH (ref 6–20)
CO2: 14 mmol/L — ABNORMAL LOW (ref 22–32)
Calcium: 8.5 mg/dL — ABNORMAL LOW (ref 8.9–10.3)
Chloride: 93 mmol/L — ABNORMAL LOW (ref 98–111)
Creatinine, Ser: 22.65 mg/dL — ABNORMAL HIGH (ref 0.61–1.24)
GFR, Estimated: 2 mL/min — ABNORMAL LOW (ref >60)
Glucose, Bld: 80 mg/dL (ref 70–99)
Phosphorus: 9.2 mg/dL — ABNORMAL HIGH (ref 2.5–4.6)
Potassium: 5.2 mmol/L — ABNORMAL HIGH (ref 3.5–5.1)
Sodium: 131 mmol/L — ABNORMAL LOW (ref 135–145)

## 2023-02-01 LAB — CULTURE, BLOOD (ROUTINE X 2)
Culture: NO GROWTH
Special Requests: ADEQUATE

## 2023-02-01 LAB — VANCOMYCIN, RANDOM: Vancomycin Rm: 18 ug/mL

## 2023-02-01 LAB — NO BLOOD PRODUCTS

## 2023-02-01 SURGERY — IRRIGATION AND DEBRIDEMENT EXTREMITY
Anesthesia: Monitor Anesthesia Care | Site: Foot | Laterality: Right

## 2023-02-01 MED ORDER — BUPIVACAINE HCL (PF) 0.25 % IJ SOLN
INTRAMUSCULAR | Status: DC | PRN
Start: 2023-02-01 — End: 2023-02-01
  Administered 2023-02-01: 20 mL

## 2023-02-01 MED ORDER — PROPOFOL 500 MG/50ML IV EMUL
INTRAVENOUS | Status: DC | PRN
  Administered 2023-02-01: 25 ug/kg/min via INTRAVENOUS

## 2023-02-01 MED ORDER — LIDOCAINE HCL (PF) 2 % IJ SOLN
INTRAMUSCULAR | Status: DC | PRN
  Administered 2023-02-01: 40 mg via INTRADERMAL

## 2023-02-01 MED ORDER — FENTANYL CITRATE (PF) 100 MCG/2ML IJ SOLN
25.0000 ug | INTRAMUSCULAR | Status: DC | PRN
  Administered 2023-02-01: 50 ug via INTRAVENOUS

## 2023-02-01 MED ORDER — VANCOMYCIN HCL 500 MG IV SOLR
INTRAVENOUS | Status: AC
  Filled 2023-02-01: qty 10

## 2023-02-01 MED ORDER — FENTANYL CITRATE (PF) 100 MCG/2ML IJ SOLN
INTRAMUSCULAR | Status: AC
  Filled 2023-02-01: qty 2

## 2023-02-01 MED ORDER — CHLORHEXIDINE GLUCONATE 0.12 % MT SOLN: 15.0000 mL | Freq: Once | OROMUCOSAL | Status: AC

## 2023-02-01 MED ORDER — MIDAZOLAM HCL 2 MG/2ML IJ SOLN
INTRAMUSCULAR | Status: AC
  Filled 2023-02-01: qty 2

## 2023-02-01 MED ORDER — BUPIVACAINE HCL (PF) 0.25 % IJ SOLN
INTRAMUSCULAR | Status: AC
  Filled 2023-02-01: qty 10

## 2023-02-01 MED ORDER — ORAL CARE MOUTH RINSE: 15.0000 mL | Freq: Once | OROMUCOSAL | Status: AC

## 2023-02-01 MED ORDER — VANCOMYCIN HCL 500 MG IV SOLR
INTRAVENOUS | Status: DC | PRN
  Administered 2023-02-01: 500 mg

## 2023-02-01 MED ORDER — PROPOFOL 10 MG/ML IV BOLUS
INTRAVENOUS | Status: DC | PRN
  Administered 2023-02-01: 25 mg via INTRAVENOUS

## 2023-02-01 MED ORDER — GENTAMICIN SULFATE 40 MG/ML IJ SOLN
INTRAMUSCULAR | Status: AC
  Filled 2023-02-01: qty 4

## 2023-02-01 MED ORDER — GENTAMICIN SULFATE 40 MG/ML IJ SOLN
INTRAMUSCULAR | Status: DC | PRN
Start: 2023-02-01 — End: 2023-02-01
  Administered 2023-02-01: 160 mg

## 2023-02-01 MED ORDER — CHLORHEXIDINE GLUCONATE 0.12 % MT SOLN
OROMUCOSAL | Status: AC
  Administered 2023-02-01: 15 mL via OROMUCOSAL
  Filled 2023-02-01: qty 15

## 2023-02-01 MED ORDER — MIDAZOLAM HCL 2 MG/2ML IJ SOLN
INTRAMUSCULAR | Status: DC | PRN
  Administered 2023-02-01 (×2): 1 mg via INTRAVENOUS

## 2023-02-01 MED ORDER — PROMETHAZINE HCL 25 MG/ML IJ SOLN: 6.2500 mg | INTRAMUSCULAR | Status: DC | PRN

## 2023-02-01 MED ORDER — SODIUM CHLORIDE 0.9 % IV SOLN: INTRAVENOUS | Status: DC

## 2023-02-01 SURGICAL SUPPLY — 53 items
APL PRP STRL LF DISP 70% ISPRP (MISCELLANEOUS) ×1
BAG COUNTER SPONGE SURGICOUNT (BAG) ×2 IMPLANT
BAG SPNG CNTER NS LX DISP (BAG) ×1
BLADE LONG MED 31X9 (MISCELLANEOUS) IMPLANT
BNDG CMPR 9X4 STRL LF SNTH (GAUZE/BANDAGES/DRESSINGS)
BNDG CMPR STD VLCR NS LF 5.8X4 (GAUZE/BANDAGES/DRESSINGS) ×1
BNDG ELASTIC 4X5.8 VLCR NS LF (GAUZE/BANDAGES/DRESSINGS) IMPLANT
BNDG ELASTIC 4X5.8 VLCR STR LF (GAUZE/BANDAGES/DRESSINGS) IMPLANT
BNDG ESMARK 4X9 LF (GAUZE/BANDAGES/DRESSINGS) IMPLANT
BNDG GAUZE DERMACEA FLUFF 4 (GAUZE/BANDAGES/DRESSINGS) IMPLANT
BNDG GZE DERMACEA 4 6PLY (GAUZE/BANDAGES/DRESSINGS) ×1
CANISTER WOUNDNEG PRESSURE 500 (CANNISTER) IMPLANT
CHLORAPREP W/TINT 26 (MISCELLANEOUS) ×2 IMPLANT
COVER SURGICAL LIGHT HANDLE (MISCELLANEOUS) ×2 IMPLANT
CUFF TOURN SGL QUICK 18X4 (TOURNIQUET CUFF) IMPLANT
CUFF TOURN SGL QUICK 34 (TOURNIQUET CUFF)
CUFF TRNQT CYL 34X4.125X (TOURNIQUET CUFF) IMPLANT
DRAPE U-SHAPE 47X51 STRL (DRAPES) ×2 IMPLANT
DRSG ADAPTIC 3X8 NADH LF (GAUZE/BANDAGES/DRESSINGS) IMPLANT
DRSG VAC GRANUFOAM MED (GAUZE/BANDAGES/DRESSINGS) IMPLANT
ELECT CAUTERY BLADE 6.4 (BLADE) ×2 IMPLANT
ELECT REM PT RETURN 9FT ADLT (ELECTROSURGICAL) ×1
ELECTRODE REM PT RTRN 9FT ADLT (ELECTROSURGICAL) ×2 IMPLANT
GAUZE SPONGE 4X4 12PLY STRL (GAUZE/BANDAGES/DRESSINGS) IMPLANT
GLOVE BIO SURGEON STRL SZ7 (GLOVE) ×2 IMPLANT
GLOVE BIOGEL PI IND STRL 7.5 (GLOVE) ×2 IMPLANT
GOWN STRL REUS W/ TWL LRG LVL3 (GOWN DISPOSABLE) ×4 IMPLANT
GOWN STRL REUS W/TWL LRG LVL3 (GOWN DISPOSABLE) ×2
GRAFT SKIN WND MICRO 38 (Tissue) IMPLANT
GRAFT SKIN WND SURGIBIND 3X7 (Tissue) IMPLANT
HANDPIECE INTERPULSE COAX TIP (DISPOSABLE) ×1
KIT BASIN OR (CUSTOM PROCEDURE TRAY) ×2 IMPLANT
KIT STIMULAN RAPID CURE 5CC (Orthopedic Implant) IMPLANT
KIT TURNOVER KIT B (KITS) ×2 IMPLANT
MANIFOLD NEPTUNE II (INSTRUMENTS) ×2 IMPLANT
NDL HYPO 25GX1X1/2 BEV (NEEDLE) IMPLANT
NEEDLE HYPO 25GX1X1/2 BEV (NEEDLE) ×1 IMPLANT
NS IRRIG 1000ML POUR BTL (IV SOLUTION) ×2 IMPLANT
PACK ORTHO EXTREMITY (CUSTOM PROCEDURE TRAY) ×2 IMPLANT
PAD ARMBOARD 7.5X6 YLW CONV (MISCELLANEOUS) ×4 IMPLANT
PROBE DEBRIDE SONICVAC MISONIX (TIP) IMPLANT
SET CYSTO W/LG BORE CLAMP LF (SET/KITS/TRAYS/PACK) ×2 IMPLANT
SET HNDPC FAN SPRY TIP SCT (DISPOSABLE) IMPLANT
SOL PREP POV-IOD 4OZ 10% (MISCELLANEOUS) ×4 IMPLANT
STAPLER VISISTAT 35W (STAPLE) IMPLANT
SUT ETHILON 3 0 PS 1 (SUTURE) ×2 IMPLANT
SUT MNCRL AB 3-0 PS2 18 (SUTURE) ×2 IMPLANT
SUT PDS AB 2-0 CT2 27 (SUTURE) IMPLANT
SYR CONTROL 10ML LL (SYRINGE) IMPLANT
TOWEL GREEN STERILE (TOWEL DISPOSABLE) ×2 IMPLANT
TOWEL GREEN STERILE FF (TOWEL DISPOSABLE) ×2 IMPLANT
TUBE CONNECTING 12X1/4 (SUCTIONS) ×2 IMPLANT
YANKAUER SUCT BULB TIP NO VENT (SUCTIONS) ×2 IMPLANT

## 2023-02-01 NOTE — Progress Notes (Signed)
   02/01/23 0026  BiPAP/CPAP/SIPAP  BiPAP/CPAP/SIPAP Pt Type Adult  Reason BIPAP/CPAP not in use Non-compliant (refused)

## 2023-02-01 NOTE — Plan of Care (Signed)
  Problem: Education: Goal: Knowledge of General Education information will improve Description Including pain rating scale, medication(s)/side effects and non-pharmacologic comfort measures Outcome: Progressing   

## 2023-02-01 NOTE — Anesthesia Preprocedure Evaluation (Addendum)
Anesthesia Evaluation  Patient identified by MRN, date of birth, ID band Patient awake    Reviewed: Allergy & Precautions, NPO status , Patient's Chart, lab work & pertinent test results  Airway Mallampati: III  TM Distance: >3 FB Neck ROM: Full    Dental   Pulmonary sleep apnea , Patient abstained from smoking., former smoker   breath sounds clear to auscultation       Cardiovascular hypertension, Pt. on medications +CHF  + dysrhythmias Atrial Fibrillation  Rhythm:Regular Rate:Normal     Neuro/Psych  Neuromuscular disease    GI/Hepatic negative GI ROS, Neg liver ROS,,,  Endo/Other  negative endocrine ROS    Renal/GU ESRF and DialysisRenal disease     Musculoskeletal   Abdominal   Peds  Hematology  (+) Blood dyscrasia, anemia   Anesthesia Other Findings   Reproductive/Obstetrics                             Anesthesia Physical Anesthesia Plan  ASA: 4  Anesthesia Plan: MAC   Post-op Pain Management:    Induction:   PONV Risk Score and Plan: 1 and Propofol infusion, Ondansetron and Treatment may vary due to age or medical condition  Airway Management Planned: Natural Airway and Simple Face Mask  Additional Equipment:   Intra-op Plan:   Post-operative Plan:   Informed Consent: I have reviewed the patients History and Physical, chart, labs and discussed the procedure including the risks, benefits and alternatives for the proposed anesthesia with the patient or authorized representative who has indicated his/her understanding and acceptance.       Plan Discussed with: CRNA  Anesthesia Plan Comments:        Anesthesia Quick Evaluation

## 2023-02-01 NOTE — Progress Notes (Signed)
PROGRESS NOTE  [name]  ZOX:[id number] DOB: 15-Feb-1976 DOA: 01/27/2023 PCP: [name], MD   Brief Narrative: Patient is a 47 year old male with history of ESRD, failed renal transplant, right lower extremity osteomyelitis status post first ray transmetatarsal amputation who was brought from outpatient dialysis center for further evaluation of hypotension.  He was recently treated with prolonged course of antibiotics for right lower extremity osteomyelitis .patient was also having thick drainage from his left femoral tunneled catheter for 3 days.  Patient was suspected to have septic shock and admitted under PCCM service, started on pressors.  Blood cultures showed proteus.  ID, nephrology, vascular surgery following.  Patient transferred to [facility] service on 8/22.  Plan for transhepatic catheter placement for dialysis on Monday  Assessment & Plan:  Active Problems:   Paroxysmal atrial fibrillation (HCC)   Septic shock (HCC)   Chronic anticoagulation   Gram-negative bacteremia   ESRD on hemodialysis (HCC)   Non-healing wound of lower extremity   Chronic osteomyelitis of right foot with draining sinus (HCC)   Septic shock/gram-negative bacteremia/HD line infection: Presented with hypotension.  Patient was also having thick drainage from his left femoral tunneled catheter for 3 days.  Patient was suspected to have septic shock and admitted under PCCM service, started on pressors.  Blood cultures showing Proteus.  Currently on ceftriaxone, Vanco, Flagyl.  Continue follow-up final culture report.  Repeat blood cultures have been negative Sepsis physiology has resolved, lactic acidosis has resolved .currently blood pressure stable.  Patient has chronic hypotension and is on midodrine.  ESRD: Nephrology following.  Concern for HD line infection.  Patient has access in the left femoral area.  Complicated vascular access situation.  Vascular surgery following,he was  planned for graft  placement in few weeks.  Currently he has no options for tunneled dialysis catheter placement.  Vascular surgery discussed with IR.Underwent  fluoroscopy guided exchange of left femoral hemodialysis catheter by IR on 8/22.now the plan is for transhepatic catheter placement as current catheter is not working.We will continue phosphate binders, has mild hyperkalemia.  Continue Lokelma  Right lower extremity osteomyelitis: Status post right first ray amputation.  MRI shows changes of osteomyelitis.  X-ray without periosteal changes.  Continue current antibiotics.ID following.  Podiatry following,plan for intervention after dialysis catheter placement.  Hyponatremia: Likely from volume overload.  Volume management as per dialysis  Anemia of chronic disease: Continue to monitor hemoglobin.  Transfuse if hemoglobin drops less than 7  Paroxysmal A-fib: Currently in normal sinus rhythm.  Monitor on telemetry.  Currently Eliquis is on hold.  On heparin drip for possible upcoming procedures  Hyperglycemia: A1c of 5.9 as per 6/24.  Monitor blood sugars  Morbid obesity: BMI 40       DVT prophylaxis:iv heparin     Code Status: Full Code  Family Communication: called and discussed with mother Darl Pikes on phone on 8/23  Patient status:Inpatient  Patient is from :home  Anticipated discharge WJ:XBJY  Estimated DC date:not sure   Consultants: ID, PCCM, nephrology, vascular surgery  Procedures: Dialysis, exchange of dialysis catheter  Antimicrobials:  Anti-infectives (From admission, onward)    Start     Dose/Rate Route Frequency Ordered Stop   01/30/23 2200  ceFAZolin (ANCEF) IVPB 1 g/50 mL premix        1 g 100 mL/hr over 30 Minutes Intravenous Every 24 hours 01/30/23 1413     01/29/23 2200  metroNIDAZOLE (FLAGYL) tablet 500 mg        500  mg Oral Every 12 hours 01/29/23 1509     01/29/23 2200  cefTRIAXone (ROCEPHIN) 2 g in sodium chloride 0.9 % 100 mL IVPB  Status:  Discontinued        2  g 200 mL/hr over 30 Minutes Intravenous Every 24 hours 01/29/23 1548 01/30/23 1413   01/29/23 1549  vancomycin variable dose per unstable renal function (pharmacist dosing)         Does not apply See admin instructions 01/29/23 1549     01/29/23 0600  ceFEPIme (MAXIPIME) 1 g in sodium chloride 0.9 % 100 mL IVPB  Status:  Discontinued        1 g 200 mL/hr over 30 Minutes Intravenous Every 24 hours 01/28/23 1006 01/29/23 1548   01/28/23 1600  ceFEPIme (MAXIPIME) 1 g in sodium chloride 0.9 % 100 mL IVPB  Status:  Discontinued        1 g 200 mL/hr over 30 Minutes Intravenous Every 24 hours 01/27/23 1715 01/27/23 1759   01/28/23 0400  ceFEPIme (MAXIPIME) 2 g in sodium chloride 0.9 % 100 mL IVPB  Status:  Discontinued        2 g 200 mL/hr over 30 Minutes Intravenous Every 12 hours 01/27/23 1759 01/28/23 1006   01/27/23 1811  vancomycin variable dose per unstable renal function (pharmacist dosing)  Status:  Discontinued         Does not apply See admin instructions 01/27/23 1811 01/28/23 0959   01/27/23 1445  metroNIDAZOLE (FLAGYL) IVPB 500 mg  Status:  Discontinued        500 mg 100 mL/hr over 60 Minutes Intravenous Every 12 hours 01/27/23 1434 01/28/23 1010   01/27/23 1445  ceFEPIme (MAXIPIME) 1 g in sodium chloride 0.9 % 100 mL IVPB        1 g 200 mL/hr over 30 Minutes Intravenous  Once 01/27/23 1440 01/27/23 1618   01/27/23 1115  cefTRIAXone (ROCEPHIN) 2 g in sodium chloride 0.9 % 100 mL IVPB        2 g 200 mL/hr over 30 Minutes Intravenous  Once 01/27/23 1101 01/27/23 1212   01/27/23 1115  vancomycin (VANCOCIN) 2,500 mg in sodium chloride 0.9 % 500 mL IVPB        2,500 mg 262.5 mL/hr over 120 Minutes Intravenous  Once 01/27/23 1108 01/27/23 1534       Subjective: Patient seen and examined at bedside today.  Lying in bed.  Not in apparent distress.  No new complaints.  Objective: Vitals:   01/31/23 1610 01/31/23 2033 02/01/23 0409 02/01/23 0725  BP: 121/83 112/89 107/79 112/81   Pulse: 63 78 71 78  Resp: 18 (!) 22 19 18   Temp: 98.4 F (36.9 C) 97.8 F (36.6 C)  97.6 F (36.4 C)  TempSrc:  Oral  Oral  SpO2: 100% 95% 100% 99%  Weight:      Height:        Intake/Output Summary (Last 24 hours) at 02/01/2023 1117 Last data filed at 02/01/2023 0451 Gross per 24 hour  Intake 1172.72 ml  Output --  Net 1172.72 ml   Filed Weights   01/30/23 0757 01/30/23 0847 01/31/23 0715  Weight: (!) 141.8 kg (!) 141.8 kg (!) 143.2 kg    Examination:  General exam: Overall comfortable, not in distress,obese HEENT: PERRL Respiratory system:  no wheezes or crackles  Cardiovascular system: S1 & S2 heard, RRR.  Gastrointestinal system: Abdomen is obese, soft and nontender. Central nervous system: Alert and oriented Extremities: Femoral  dialysis catheter on the left groin, first rayamputation of the right foot with gangrenous changes Skin: No rashes, no ulcers,no icterus     Data Reviewed: I have personally reviewed following labs and imaging studies  CBC: Recent Labs  Lab 01/27/23 1114 01/28/23 0630 01/29/23 0833 01/30/23 0505 01/31/23 0818 02/01/23 0949  WBC 5.6 9.2 9.4 11.1* 8.9 8.4  NEUTROABS 4.4  --   --   --   --   --   HGB 8.5* 9.5* 8.0* 8.2* 7.9* 8.3*  HCT 26.8* 29.9* 24.9* 25.5* 24.4* 25.5*  MCV 86.7 88.7 86.2 84.7 84.4 86.1  PLT 214 227 271 303 306 316   Basic Metabolic Panel: Recent Labs  Lab 01/28/23 0630 01/30/23 0505 01/30/23 0722 01/31/23 0818 02/01/23 0949  NA 130* 130* 131* 130* 131*  K 5.9* 5.9* 5.5* 5.2* 5.2*  CL 94* 95* 96* 95* 93*  CO2 16* 17* 15* 14* 14*  GLUCOSE 103* 85 85 107* 80  BUN 99* 102* 107* 111* 116*  CREATININE 19.76* 20.48* 20.75* 22.18* 22.65*  CALCIUM 8.6* 8.2* 8.3* 8.3* 8.5*  MG 2.1  --   --   --   --   PHOS  --   --  6.3*  --  9.2*     Recent Results (from the past 240 hour(s))  Culture, blood (Routine x 2)     Status: Abnormal   Collection Time: 01/27/23 11:00 AM   Specimen: BLOOD  Result Value Ref  Range Status   Specimen Description BLOOD RIGHT ANTECUBITAL  Final   Special Requests   Final    BOTTLES DRAWN AEROBIC AND ANAEROBIC Blood Culture adequate volume   Culture  Setup Time   Final    GRAM NEGATIVE RODS IN BOTH AEROBIC AND ANAEROBIC BOTTLES CRITICAL RESULT CALLED TO, READ BACK BY AND VERIFIED WITH: V BRYK,PHARMD@[id number]  01/28/23 MK Performed at [facility], [address], [place], [place] [address]    Culture PROTEUS MIRABILIS (A)  Final   Report Status 01/30/2023 FINAL  Final   Organism ID, Bacteria PROTEUS MIRABILIS  Final      Susceptibility   Proteus mirabilis - MIC*    AMPICILLIN <=2 SENSITIVE Sensitive     CEFEPIME <=0.12 SENSITIVE Sensitive     CEFTAZIDIME <=1 SENSITIVE Sensitive     CEFTRIAXONE <=0.25 SENSITIVE Sensitive     CIPROFLOXACIN <=0.25 SENSITIVE Sensitive     GENTAMICIN <=1 SENSITIVE Sensitive     IMIPENEM 2 SENSITIVE Sensitive     TRIMETH/SULFA <=20 SENSITIVE Sensitive     AMPICILLIN/SULBACTAM <=2 SENSITIVE Sensitive     PIP/TAZO <=4 SENSITIVE Sensitive     * PROTEUS MIRABILIS  Blood Culture ID Panel (Reflexed)     Status: Abnormal   Collection Time: 01/27/23 11:00 AM  Result Value Ref Range Status   Enterococcus faecalis NOT DETECTED NOT DETECTED Final   Enterococcus Faecium NOT DETECTED NOT DETECTED Final   Listeria monocytogenes NOT DETECTED NOT DETECTED Final   Staphylococcus species NOT DETECTED NOT DETECTED Final   Staphylococcus aureus (BCID) NOT DETECTED NOT DETECTED Final   Staphylococcus epidermidis NOT DETECTED NOT DETECTED Final   Staphylococcus lugdunensis NOT DETECTED NOT DETECTED Final   Streptococcus species NOT DETECTED NOT DETECTED Final   Streptococcus agalactiae NOT DETECTED NOT DETECTED Final   Streptococcus pneumoniae NOT DETECTED NOT DETECTED Final   Streptococcus pyogenes NOT DETECTED NOT DETECTED Final   A.calcoaceticus-baumannii NOT DETECTED NOT DETECTED Final   Bacteroides fragilis NOT DETECTED NOT DETECTED Final  Enterobacterales DETECTED (A) NOT DETECTED Final    Comment: Enterobacterales represent a large order of gram negative bacteria, not a single organism. CRITICAL RESULT CALLED TO, READ BACK BY AND VERIFIED WITH: V BRYK,PHARMD@[id number]  01/28/23 MK    Enterobacter cloacae complex NOT DETECTED NOT DETECTED Final   Escherichia coli NOT DETECTED NOT DETECTED Final   Klebsiella aerogenes NOT DETECTED NOT DETECTED Final   Klebsiella oxytoca NOT DETECTED NOT DETECTED Final   Klebsiella pneumoniae NOT DETECTED NOT DETECTED Final   Proteus species DETECTED (A) NOT DETECTED Final    Comment: CRITICAL RESULT CALLED TO, READ BACK BY AND VERIFIED WITH: V BRYK,PHARMD@[id number]  01/28/23 MK    Salmonella species NOT DETECTED NOT DETECTED Final   Serratia marcescens NOT DETECTED NOT DETECTED Final   Haemophilus influenzae NOT DETECTED NOT DETECTED Final   Neisseria meningitidis NOT DETECTED NOT DETECTED Final   Pseudomonas aeruginosa NOT DETECTED NOT DETECTED Final   Stenotrophomonas maltophilia NOT DETECTED NOT DETECTED Final   Candida albicans NOT DETECTED NOT DETECTED Final   Candida auris NOT DETECTED NOT DETECTED Final   Candida glabrata NOT DETECTED NOT DETECTED Final   Candida krusei NOT DETECTED NOT DETECTED Final   Candida parapsilosis NOT DETECTED NOT DETECTED Final   Candida tropicalis NOT DETECTED NOT DETECTED Final   Cryptococcus neoformans/gattii NOT DETECTED NOT DETECTED Final   CTX-M ESBL NOT DETECTED NOT DETECTED Final   Carbapenem resistance IMP NOT DETECTED NOT DETECTED Final   Carbapenem resistance KPC NOT DETECTED NOT DETECTED Final   Carbapenem resistance NDM NOT DETECTED NOT DETECTED Final   Carbapenem resist OXA 48 LIKE NOT DETECTED NOT DETECTED Final   Carbapenem resistance VIM NOT DETECTED NOT DETECTED Final    Comment: Performed at [facility], [address], [place], [place] [address]  SARS Coronavirus 2 by RT PCR (hospital order, performed in [facility] hospital lab)  *cepheid single result test* Anterior Nasal Swab     Status: None   Collection Time: 01/27/23 11:42 AM   Specimen: Anterior Nasal Swab  Result Value Ref Range Status   SARS Coronavirus 2 by RT PCR NEGATIVE NEGATIVE Final    Comment: Performed at [facility], [address], [place], [place] [address]  Culture, blood (Routine x 2)     Status: None   Collection Time: 01/27/23 11:50 AM   Specimen: BLOOD RIGHT HAND  Result Value Ref Range Status   Specimen Description BLOOD RIGHT HAND  Final   Special Requests   Final    BOTTLES DRAWN AEROBIC AND ANAEROBIC Blood Culture adequate volume   Culture   Final    NO GROWTH 5 DAYS Performed at [facility], [address] [place], [place] [address]    Report Status 02/01/2023 FINAL  Final  MRSA Next Gen by PCR, Nasal     Status: None   Collection Time: 01/27/23  3:09 PM   Specimen: Nasal Mucosa; Nasal Swab  Result Value Ref Range Status   MRSA by PCR Next Gen NOT DETECTED NOT DETECTED Final    Comment: (NOTE) The GeneXpert MRSA Assay (FDA approved for NASAL specimens only), is one component of a comprehensive MRSA colonization surveillance program. It is not intended to diagnose MRSA infection nor to guide or monitor treatment for MRSA infections. Test performance is not FDA approved in patients less than 74 years old. Performed at [facility], [address], [place], [place] [address]   Culture, blood (Routine X 2) w Reflex  to ID Panel     Status: None (Preliminary result)   Collection Time: 01/29/23  8:33 AM   Specimen: BLOOD LEFT ARM  Result Value Ref Range Status   Specimen Description BLOOD LEFT ARM  Final   Special Requests   Final    BOTTLES DRAWN AEROBIC ONLY Blood Culture adequate volume   Culture   Final    NO GROWTH 3 DAYS Performed at [facility], [address], [place], [place] [address]    Report Status PENDING  Incomplete  Culture, blood (Routine X 2) w Reflex to ID Panel     Status: None  (Preliminary result)   Collection Time: 01/29/23  8:35 AM   Specimen: BLOOD RIGHT HAND  Result Value Ref Range Status   Specimen Description BLOOD RIGHT HAND  Final   Special Requests   Final    BOTTLES DRAWN AEROBIC ONLY Blood Culture adequate volume   Culture   Final    NO GROWTH 3 DAYS Performed at [facility], [address], [place], [place] [address]    Report Status PENDING  Incomplete     Radiology Studies: No results found.  Scheduled Meds:  (feeding supplement) PROSource Plus  30 mL Oral BID BM   Chlorhexidine Gluconate Cloth  6 each Topical Daily   Chlorhexidine Gluconate Cloth  6 each Topical Q0600   darbepoetin (ARANESP) injection - DIALYSIS  150 mcg Subcutaneous Q Thu-1800   ferric citrate  630 mg Oral TID WC   metroNIDAZOLE  500 mg Oral Q12H   midodrine  15 mg Oral TID WC   sodium bicarbonate  1,300 mg Oral TID   sodium zirconium cyclosilicate  10 g Oral TID   vancomycin variable dose per unstable renal function (pharmacist dosing)   Does not apply See admin instructions   Continuous Infusions:  sodium chloride Stopped (01/30/23 2358)    ceFAZolin (ANCEF) IV Stopped (01/31/23 2210)   heparin 2,600 Units/hr (02/01/23 0451)     LOS: 5 days   Burnadette Pop, MD [facility] P8/25/2024, 11:17 AM

## 2023-02-01 NOTE — Brief Op Note (Signed)
02/01/2023  3:17 PM  PATIENT:  [name]  47 y.o. male  PRE-OPERATIVE DIAGNOSIS:  RIGHT FOOT INFECTION  POST-OPERATIVE DIAGNOSIS:  RIGHT FOOT INFECTION  PROCEDURE:  Procedure(s): IRRIGATION AND DEBRIDEMENT RIGHT FOOT (Right) ALLOGRAFT (KERIS)APPLICATION (Right) APPLICATION OF WOUND VAC (Right)  SURGEON:  Surgeons and Role:    * Shaasia Odle, Rachelle Hora, DPM - Primary  PHYSICIAN ASSISTANT:   ASSISTANTS: none   ANESTHESIA:   local and MAC  EBL:  5 mL   BLOOD ADMINISTERED:none  DRAINS: wound VAC   LOCAL MEDICATIONS USED:  MARCAINE    and Amount: 20 ml  SPECIMEN:  first metatarsal, right foot wound  DISPOSITION OF SPECIMEN:  cultures and path  COUNTS:  YES  TOURNIQUET:   Total Tourniquet Time Documented: Ankle (Right) - 31 minutes Total: Ankle (Right) - 31 minutes   DICTATION: .Note written in EPIC  PLAN OF CARE: Admit to inpatient   PATIENT DISPOSITION:  PACU - hemodynamically stable.   Delay start of Pharmacological VTE agent (>24hrs) due to surgical blood loss or risk of bleeding: no  -Can restart heparin at 20:00 -NWB RLE -Will need further abx pending cultures / biopsy if we are to save the foot. Overall appeared free of purulence, bone likely with chronic osteo, resection margin was marked with ink for pathology to evaluate for surgical cure -Will follow. He will need home health for wound care, VAC changes

## 2023-02-01 NOTE — Plan of Care (Addendum)
IR was requested for evaluation of achieving HD access.    Case was reviewed by Dr. Loreta Ave on Friday 01/30/23, plan for possible catheter exchange, possible trans-venous catheter, possible trans-hepatic catheter on Monday 02/02/23.    NPO at [facility] [place] order has been d/c'd due to pt NPO today for possible sx.  On IV heparin.   PLAN  - NPO at MN: Please ensure patient is NPO at Monday MN - IV heparin to be stopped at 3:30 am (needs at least 6 hr hold.)- MAR held.  - Repeat INR/CBC/ BMP in AM  - Formal consult performed - Abx choice per performing MD, ancef VS different abx for transhepatic HD   Please call IR for questions and concerns.   Robert Brace PA-C 02/01/2023 12:35 PM

## 2023-02-01 NOTE — H&P (Addendum)
History and Physical Interval Note:  02/01/2023 1:40 PM  [name]  has presented today for surgery, with the diagnosis of right foot osteomyelitis.  The various methods of treatment have been discussed with the patient. After consideration of risks, benefits and other options for treatment, the patient has consented to   Procedure(s): IRRIGATION AND DEBRIDEMENT RIGHT FOOT (Right) ALLOGRAFT (KERIS)APPLICATION (Right) APPLICATION OF WOUND VAC (Right) as a surgical intervention.  The patient's history has been reviewed, patient examined, no change in status, stable for surgery.  I have reviewed the patient's chart and labs.  He indicated understanding of the need for proceeding with the procedure and does not have any questions for me. I asked if he would like me to update his family following surgery and he said that he did not.    Edwin Cap

## 2023-02-01 NOTE — Progress Notes (Addendum)
Subjective: This a.m. denies SOB CP, or abdominal pain.  No nausea or vomiting.  A.m. labs just drawn after prior trouble sticking.  Last HD 8/21, noted podiatry trying to save R foot tentative surgery this a.m. with wound VAC, IR a.m. 8/26 transhepatic catheter  Objective Vital signs in last 24 hours: Vitals:   01/31/23 1610 01/31/23 2033 02/01/23 0409 02/01/23 0725  BP: 121/83 112/89 107/79 112/81  Pulse: 63 78 71 78  Resp: 18 (!) 22 19 18   Temp: 98.4 F (36.9 C) 97.8 F (36.6 C)  97.6 F (36.4 C)  TempSrc:  Oral  Oral  SpO2: 100% 95% 100% 99%  Weight:      Height:       Weight change: 1.4 kg  Physical Exam: General: Alert ,obese adult male NAD Rm air Heart: RRR no MRG Lungs: CTA bilaterally nonlabored breathing Abdomen: NABS, obese, soft, ND, NT Extremities: Trace bipedal edema /R Ft. dressing not examined Dialysis Access: L Fem TDC dressing dry clear  OP dialysis Orders: TTS at [facility] 4.5hr, 500/500, EDW 137kg, 2K/2Ca bath, TDC L femoral, heparin 5000 initial + 2500 mid run bolus - Parsabiv 7.5mg  IV q HD - Mircera IV q 2 weeks - last 8/6   Assessment/Plan: Proteus bacteremia: Blood Cx 8/20 (2 of 4 positive?), repeat 8/22 pending. On Ancef/ vanc . Source = TDC or R foot wound (osteo) . Old TDC removed and new one replaced by IR 8/22 but was nonfuctional (as expected by IR MD).   Dialysis access failure: Running out of options unfortunately, has been on HD ~20 years. Known central venous disease/SVC stenosis, IR planning 8/26 attempt at transhepatic catheter.  New L femoral TDC 8/22 was nonfunctional as expected.  Had  been followed by [facility]=plans for HeRO graft in the near future -not an option currently until infection cleared.  Chronic R foot wound: Osteomyelitis s/p R toe amputation 7/5, not healing.  Antibiotics per ID /podiatry seen yesterday plans for tentative surgery ,wound VAC today to save foot ESRD: TTS OP schedule a.m. labs  today pending, 8/24 lab= BUN 111,  Cr 22.18 , NA 130, CO2= 14 (start sodium bicarb p.o. )K5.2 improved from 5.5 on Lokelma 10 gm po bid Last  HD =short-HD on Tuesday 8/20, so dialyzed again here 8/21   -hemodialysis tomorrow after IR places catheter Hypotension/volume: BP stable this morning /chronic low BP on midodrine 15mg  TID,  6 kg over EDW no SOB Anemia of ESRD: This a.m. =Hgb 8.3  Aranesp 150 mg given Thur 8/22/ Secondary HTPH: CorrCa ok, Phos 6.3 on Auryxia as binder  Parsabiv not formulary here. Nutrition: Alb 2.8 8/23/ on supps. pAF: Eliquis on hold for now on IV heparin OSA Adjustment disorder: Mood low/depressed, yest.  Spiritual counseling ordered has not seen yet, patient told me this morning "does not need" but I feel he does need counseling   Lenny Pastel, PA-C [facility] Beeper [phone] 02/01/2023,10:31 AM  LOS: 5 days   Pt seen, examined and agree w assess/plan as above with additions as indicated. Unfortunately poor prognosis with this case, running out of deep veins to place a tunneled catheter, has been on HD 20 years. IR planning to attempt transhepatic catheter. This is a last-ditch attempt at HD access and is known to be a high-risk procedure.  Rob [facility] 02/01/2023, 8:11 PM    Labs: Basic Metabolic Panel: Recent Labs  Lab 01/30/23 0505 01/30/23 0722 01/31/23 0818  NA  130* 131* 130*  K 5.9* 5.5* 5.2*  CL 95* 96* 95*  CO2 17* 15* 14*  GLUCOSE 85 85 107*  BUN 102* 107* 111*  CREATININE 20.48* 20.75* 22.18*  CALCIUM 8.2* 8.3* 8.3*  PHOS  --  6.3*  --    Liver Function Tests: Recent Labs  Lab 01/27/23 1114 01/28/23 0630 01/30/23 0722  AST 50* 34  --   ALT 53* 46*  --   ALKPHOS 149* 120  --   BILITOT 0.8 1.2  --   PROT 7.5 7.2  --   ALBUMIN 2.9* 2.9* 2.8*   No results for input(s): "LIPASE", "AMYLASE" in the last 168 hours. No results for input(s): "AMMONIA" in the last 168 hours. CBC: Recent Labs  Lab  01/27/23 1114 01/28/23 0630 01/29/23 0833 01/30/23 0505 01/31/23 0818 02/01/23 0949  WBC 5.6 9.2 9.4 11.1* 8.9 8.4  NEUTROABS 4.4  --   --   --   --   --   HGB 8.5* 9.5* 8.0* 8.2* 7.9* 8.3*  HCT 26.8* 29.9* 24.9* 25.5* 24.4* 25.5*  MCV 86.7 88.7 86.2 84.7 84.4 86.1  PLT 214 227 271 303 306 316   Cardiac Enzymes: No results for input(s): "CKTOTAL", "CKMB", "CKMBINDEX", "TROPONINI" in the last 168 hours. CBG: Recent Labs  Lab 01/27/23 2340 01/28/23 0352 01/28/23 0739 01/28/23 1129 01/28/23 2104  GLUCAP 83 74 102* 103* 85    Studies/Results: No results found. Medications:  sodium chloride Stopped (01/30/23 2358)    ceFAZolin (ANCEF) IV Stopped (01/31/23 2210)   heparin 2,600 Units/hr (02/01/23 0451)    (feeding supplement) PROSource Plus  30 mL Oral BID BM   Chlorhexidine Gluconate Cloth  6 each Topical Daily   Chlorhexidine Gluconate Cloth  6 each Topical Q0600   darbepoetin (ARANESP) injection - DIALYSIS  150 mcg Subcutaneous Q Thu-1800   ferric citrate  630 mg Oral TID WC   metroNIDAZOLE  500 mg Oral Q12H   midodrine  15 mg Oral TID WC   sodium bicarbonate  1,300 mg Oral TID   sodium zirconium cyclosilicate  10 g Oral TID   vancomycin variable dose per unstable renal function (pharmacist dosing)   Does not apply See admin instructions

## 2023-02-01 NOTE — Anesthesia Postprocedure Evaluation (Signed)
Anesthesia Post Note  Patient: Rayden Kohring Emanuelson  Procedure(s) Performed: IRRIGATION AND DEBRIDEMENT RIGHT FOOT (Right: Foot) ALLOGRAFT (KERIS)APPLICATION (Right: Foot) APPLICATION OF WOUND VAC (Right: Foot)     Patient location during evaluation: PACU Anesthesia Type: MAC Level of consciousness: awake and alert Pain management: pain level controlled Vital Signs Assessment: post-procedure vital signs reviewed and stable Respiratory status: spontaneous breathing, nonlabored ventilation, respiratory function stable and patient connected to nasal cannula oxygen Cardiovascular status: stable and blood pressure returned to baseline Postop Assessment: no apparent nausea or vomiting Anesthetic complications: no  No notable events documented.  Last Vitals:  Vitals:   02/01/23 1555 02/01/23 1646  BP: 114/81 120/76  Pulse: 67 67  Resp: 19 18  Temp: 36.4 C 37 C  SpO2: 99% 100%    Last Pain:  Vitals:   02/01/23 1555  TempSrc:   PainSc: 0-No pain                 Kennieth Rad

## 2023-02-01 NOTE — Transfer of Care (Signed)
Immediate Anesthesia Transfer of Care Note  Patient: [name]  Procedure(s) Performed: IRRIGATION AND DEBRIDEMENT RIGHT FOOT (Right: Foot) ALLOGRAFT (KERIS)APPLICATION (Right: Foot) APPLICATION OF WOUND VAC (Right: Foot)  Patient Location: PACU  Anesthesia Type:MAC  Level of Consciousness: awake, alert , oriented, and patient cooperative  Airway & Oxygen Therapy: Patient Spontanous Breathing  Post-op Assessment: Report given to RN and Post -op Vital signs reviewed and stable  Post vital signs: Reviewed and stable  Last Vitals:  Vitals Value Taken Time  BP    Temp    Pulse    Resp    SpO2      Last Pain:  Vitals:   02/01/23 1348  TempSrc: Oral  PainSc: 0-No pain      Patients Stated Pain Goal: 0 (01/27/23 2000)  Complications: No notable events documented.

## 2023-02-02 ENCOUNTER — Inpatient Hospital Stay (HOSPITAL_COMMUNITY): Payer: Medicare HMO

## 2023-02-02 DIAGNOSIS — R7881 Bacteremia: Secondary | ICD-10-CM | POA: Diagnosis not present

## 2023-02-02 HISTORY — PX: IR VENOCAVAGRAM IVC: IMG678

## 2023-02-02 HISTORY — PX: IR FLUORO GUIDE CV LINE LEFT: IMG2282

## 2023-02-02 HISTORY — PX: IR PTA AND STENT ADDL CENTRAL DIALY SEG THRU DIALY CIRCUIT LEFT: IMG6109

## 2023-02-02 LAB — CBC
HCT: 24.9 % — ABNORMAL LOW (ref 39.0–52.0)
Hemoglobin: 8.2 g/dL — ABNORMAL LOW (ref 13.0–17.0)
MCH: 28.2 pg (ref 26.0–34.0)
MCHC: 32.9 g/dL (ref 30.0–36.0)
MCV: 85.6 fL (ref 80.0–100.0)
Platelets: 328 10*3/uL (ref 150–400)
RBC: 2.91 MIL/uL — ABNORMAL LOW (ref 4.22–5.81)
RDW: 18.7 % — ABNORMAL HIGH (ref 11.5–15.5)
WBC: 7.7 10*3/uL (ref 4.0–10.5)
nRBC: 0 % (ref 0.0–0.2)

## 2023-02-02 LAB — PROTIME-INR
INR: 1.4 — ABNORMAL HIGH (ref 0.8–1.2)
Prothrombin Time: 17.1 s — ABNORMAL HIGH (ref 11.4–15.2)

## 2023-02-02 LAB — RENAL FUNCTION PANEL
Albumin: 2.8 g/dL — ABNORMAL LOW (ref 3.5–5.0)
Anion gap: 22 — ABNORMAL HIGH (ref 5–15)
BUN: 123 mg/dL — ABNORMAL HIGH (ref 6–20)
CO2: 14 mmol/L — ABNORMAL LOW (ref 22–32)
Calcium: 8.6 mg/dL — ABNORMAL LOW (ref 8.9–10.3)
Chloride: 96 mmol/L — ABNORMAL LOW (ref 98–111)
Creatinine, Ser: 24.66 mg/dL — ABNORMAL HIGH (ref 0.61–1.24)
GFR, Estimated: 2 mL/min — ABNORMAL LOW (ref >60)
Glucose, Bld: 74 mg/dL (ref 70–99)
Phosphorus: 10.6 mg/dL — ABNORMAL HIGH (ref 2.5–4.6)
Potassium: 5.8 mmol/L — ABNORMAL HIGH (ref 3.5–5.1)
Sodium: 132 mmol/L — ABNORMAL LOW (ref 135–145)

## 2023-02-02 MED ORDER — FENTANYL CITRATE (PF) 100 MCG/2ML IJ SOLN
INTRAMUSCULAR | Status: AC | PRN
  Administered 2023-02-02: 25 ug via INTRAVENOUS
  Administered 2023-02-02: 50 ug via INTRAVENOUS
  Administered 2023-02-02 (×5): 25 ug via INTRAVENOUS

## 2023-02-02 MED ORDER — MIDAZOLAM HCL 2 MG/2ML IJ SOLN
INTRAMUSCULAR | Status: AC | PRN
  Administered 2023-02-02 (×2): .5 mg via INTRAVENOUS
  Administered 2023-02-02: 1 mg via INTRAVENOUS

## 2023-02-02 MED ORDER — FENTANYL CITRATE (PF) 100 MCG/2ML IJ SOLN
INTRAMUSCULAR | Status: AC
  Filled 2023-02-02: qty 2

## 2023-02-02 MED ORDER — CEFAZOLIN SODIUM-DEXTROSE 2-4 GM/100ML-% IV SOLN
INTRAVENOUS | Status: AC
  Filled 2023-02-02: qty 100

## 2023-02-02 MED ORDER — VANCOMYCIN HCL IN DEXTROSE 1-5 GM/200ML-% IV SOLN
1000.0000 mg | Freq: Once | INTRAVENOUS | Status: AC
  Administered 2023-02-02: 1000 mg via INTRAVENOUS
  Filled 2023-02-02: qty 200

## 2023-02-02 MED ORDER — HEPARIN (PORCINE) 25000 UT/250ML-% IV SOLN
2600.0000 [IU]/h | INTRAVENOUS | Status: DC
  Administered 2023-02-02 – 2023-02-03 (×2): 2600 [IU]/h via INTRAVENOUS
  Filled 2023-02-02 (×3): qty 250

## 2023-02-02 MED ORDER — HEPARIN SODIUM (PORCINE) 1000 UNIT/ML IJ SOLN
INTRAMUSCULAR | Status: AC
  Filled 2023-02-02: qty 10

## 2023-02-02 MED ORDER — HEPARIN SODIUM (PORCINE) 1000 UNIT/ML IJ SOLN
INTRAMUSCULAR | Status: AC | PRN
  Administered 2023-02-02: 6000 [IU] via INTRAVENOUS

## 2023-02-02 MED ORDER — CEFAZOLIN SODIUM-DEXTROSE 2-4 GM/100ML-% IV SOLN
INTRAVENOUS | Status: AC | PRN
  Administered 2023-02-02: 2 g via INTRAVENOUS

## 2023-02-02 MED ORDER — MIDAZOLAM HCL 2 MG/2ML IJ SOLN
INTRAMUSCULAR | Status: AC
  Filled 2023-02-02: qty 2

## 2023-02-02 MED ORDER — IOHEXOL 300 MG/ML  SOLN
150.0000 mL | Freq: Once | INTRAMUSCULAR | Status: AC | PRN
  Administered 2023-02-02: 30 mL via INTRAVENOUS

## 2023-02-02 MED ORDER — SODIUM ZIRCONIUM CYCLOSILICATE 10 G PO PACK
10.0000 g | PACK | Freq: Once | ORAL | Status: AC
  Administered 2023-02-02: 10 g via ORAL
  Filled 2023-02-02: qty 1

## 2023-02-02 MED ORDER — LIDOCAINE-EPINEPHRINE 1 %-1:100000 IJ SOLN
INTRAMUSCULAR | Status: AC
  Filled 2023-02-02: qty 1

## 2023-02-02 MED ORDER — CHLORHEXIDINE GLUCONATE CLOTH 2 % EX PADS: 6.0000 | MEDICATED_PAD | Freq: Every day | CUTANEOUS | Status: DC

## 2023-02-02 MED ORDER — HEPARIN SODIUM (PORCINE) 1000 UNIT/ML IJ SOLN
INTRAMUSCULAR | Status: AC
  Administered 2023-02-02: 4200 [IU]
  Filled 2023-02-02: qty 5

## 2023-02-02 NOTE — Progress Notes (Signed)
HD on hold due to catheter exchange per Dr. Arlean Hopping.

## 2023-02-02 NOTE — Op Note (Signed)
Patient Name: [name] DOB: 1975/10/28  MRN: [id number]   Date of Service: 01/27/2023 - 02/01/2023  Surgeon: Dr. Sharl Ma, DPM Assistants: None Pre-operative Diagnosis:  RIGHT FOOT INFECTION Osteomyelitis right foot Chronic ulcer right foot Post-operative Diagnosis:  RIGHT FOOT INFECTION Osteomyelitis right foot Chronic ulcer right foot Procedures: Incision and drainage to bony cortex right foot Debridement of right foot wound with preparation of wound bed Insertion absorbable antibiotic beads Application of skin substitute Application of negative pressure wound therapy  Pathology/Specimens: ID Type Source Tests Collected by Time Destination  1 : RIGHT 1ST METATARSAL BONE BIOPSY Tissue PATH Bone biopsy SURGICAL PATHOLOGY Edwin Cap, DPM 02/01/2023 1506   A : RIGHT 1ST METATARSAL BONE CULTURE Tissue Foot, Right AEROBIC/ANAEROBIC CULTURE W Romie Minus STAIN (SURGICAL/DEEP WOUND) Edwin Cap, DPM 02/01/2023 1507   B : RIGHT FOOT WOUND Amputation Path fluid AEROBIC/ANAEROBIC CULTURE W GRAM STAIN (SURGICAL/DEEP WOUND) Edwin Cap, DPM 02/01/2023 1508    Anesthesia: MAC with local Hemostasis:  Total Tourniquet Time Documented: Ankle (Right) - 31 minutes Total: Ankle (Right) - 31 minutes  Estimated Blood Loss: 5 mL Materials:  Implant Name Type Inv. Item Serial No. Manufacturer Lot No. LRB No. Used Action  KIT STIMULAN RAPID CURE 5CC - [id number] Orthopedic Implant KIT STIMULAN RAPID CURE 5CC  [employer] [id number] Right 1 Implanted  GRAFT SKIN WND MICRO 38 - [id number] Tissue GRAFT SKIN WND MICRO 38  [employer] [id number] Right 1 Implanted  GRAFT SKIN WND SURGIBIND 3X7 - [id number] Tissue GRAFT SKIN WND SURGIBIND 3X7  [employer] [id number] Right 1 Implanted   Medications: 20 mL of Marcaine 0.5% plain, insertion of antibiotic beads consisting of 500 mg of vancomycin powder, 80 mg gentamicin liquid Complications: No complication noted  Indications for  Procedure:  This is a 47 y.o. male with a history of ESRD, previous right foot wound requiring first metatarsal ray resection and osteomyelitis with nonhealing wound and dehiscence of the surgical site with continued new osteomyelitis developing in the first metatarsal.  He was readmitted to the hospital with an infected tunneled dialysis catheter, this has been removed and new dialysis access is being planned, he presents today for control of the infection in his right foot prior to catheter placement. All risks, benefits and potential complications discussed prior to the procedure. All questions addressed. Informed consent signed and reviewed.      Procedure in Detail: Patient was identified in pre-operative holding area. Formal consent was signed and the right lower extremity was marked. Patient was brought back to the operating room. Anesthesia was induced. The extremity was prepped and draped in the usual sterile fashion. Timeout was taken to confirm patient name, laterality, and procedure prior to incision.   Attention was then directed to the right foot which exhibited an open chronic wound with large amounts of eschar and hypergranular tissue.  I began with a wide debridement and excision of the eschar and preparation of the wound bed for the skin substitute.  This was done with a combination of curette and scalpel to remove the nonviable tissue.  This was carried down to the level of the bone.  Deep culture of this area was taken.  The wound measured approximately 16 cm x 2 cm following debridement.  The residual metatarsal bone was then exposed.  A sagittal saw was used to resect approximately 1 to 2 cm further of the bone.  The resection margin was marked with ink and sent for pathology, a sample of  the bone was also sent for tissue culture.  Once this was completed the wound was irrigated with 3 L normal sterile saline with pulse irrigator.  Hemostasis was achieved.  Stimulan antibiotic beads  were then fashioned with 5 mg of vancomycin powder and 80 mg of gentamicin.  These were placed into the wound bed adjacent to the bone.  The tissue was closed over this with 2-0 PDS.  The remaining defect that was present was then covered with a tissue substitute.  Kerecis micronized matrix as well as a sheet was placed into the wound to fill the defect and cover the wound bed to facilitate soft tissue healing over the previous infection.  This was inset into the wound cut to size and secured with staples.  A nonadherent layer of Adaptic was applied over this and negative pressure wound therapy device was applied to facilitate further soft tissue healing.  The foot was then dressed with an Ace wrap. Patient tolerated the procedure well.   Disposition: Following a period of post-operative monitoring, patient will be transferred to the floor.

## 2023-02-02 NOTE — Progress Notes (Addendum)
ANTICOAGULATION CONSULT NOTE  Pharmacy Consult for Eliquis PTA >> heparin infusion Indication: atrial fibrillation  No Known Allergies  Patient Measurements: Height: 6\' 2"  (188 cm) Weight: (!) 144.3 kg (318 lb 2 oz) IBW/kg (Calculated) : 82.2 Heparin Dosing Weight: 114 kg  Vital Signs: Temp: 98.1 F (36.7 C) (08/26 1111) Temp Source: Oral (08/26 1111) BP: 130/87 (08/26 1111) Pulse Rate: 76 (08/26 1111)  Labs: Recent Labs    01/30/23 1354 01/30/23 2239 01/31/23 0818 01/31/23 0818 02/01/23 0949 02/02/23 0810  HGB  --   --  7.9*   < > 8.3* 8.2*  HCT  --   --  24.4*  --  25.5* 24.9*  PLT  --   --  306  --  316 328  APTT 46* 57* 80*  --   --   --   LABPROT  --   --   --   --   --  17.1*  INR  --   --   --   --   --  1.4*  HEPARINUNFRC  --   --  0.51  --  0.43  --   CREATININE  --   --  22.18*  --  22.65* 24.66*   < > = values in this interval not displayed.    Estimated Creatinine Clearance: 5.5 mL/min (A) (by C-G formula based on SCr of 24.66 mg/dL (H)).   Medical History: Past Medical History:  Diagnosis Date   Acute respiratory failure with hypoxia (HCC) 11/30/2018   Anemia    ESRD   ESRD on hemodialysis (HCC) 06/07/2011   TTS [facility]. Started HD in 2006, got transplant in 2014 lasted until Dec 2019 then went back on HD.  Has L thigh AVG as of Jun 2020.  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    History of blood transfusion    Hypertension    03/19/22- has not had high blood pressure in 5 years.   Morbid obesity (HCC)    Paroxysmal atrial fibrillation (HCC)    Pre-diabetes    no meds   Sepsis (HCC) 11/30/2018   Sleep apnea    lost weight, does not use CPAP    Medications:  Apixaban 5 mg PO BID prior to admission for atrial fibrillation  Assessment: 47 YOM who presented for septic shock with hypotension, lactic acid 2.2, requiring fluid resuscitation and vasopressors. Source of infection suspected to be tunneled dialysis catheter requiring  possible replacement. Initiating heparin infusion in lieu of apixaban for potential line replacement.  Heparin held for IR revascularization and new TDC placement today. Did receive heparin 6000 units during procedure. Ok to resume IV heparin now per d/w IR MD. Heparin previously therapeutic on heparin 2600 units/hr.  Goal of Therapy:  Heparin level 0.3-0.7 units/ml Monitor platelets by anticoagulation protocol: Yes   Plan:  Resume heparin at 2600 units / hr Daily heparin level, CBC F/u catheter function - hopeful to resume PTA Eliquis soon if no further revascularization needed   Rexford Maus, PharmD, BCPS 02/02/2023 11:20 AM

## 2023-02-02 NOTE — Care Management Important Message (Signed)
Important Message  Patient Details  Name: [name] MRN: [id number] Date of Birth: [birth year]   Medicare Important Message Given:  Yes     Dorena Bodo 02/02/2023, 3:13 PM

## 2023-02-02 NOTE — Progress Notes (Addendum)
[facility] Progress Note   Subjective:  Seen in room earlier this AM prior to IR procedure - now appears that he has already been down there, s/p IR revascularization to L iliac vein and TDC exchange - plan is to bring him for HD next shift. No CP or dyspnea this AM.  Objective Vitals:   02/02/23 1020 02/02/23 1025 02/02/23 1030 02/02/23 1035  BP: 119/85 (!) 119/91 123/85 133/89  Pulse: 75 75 74 77  Resp: 18 19 14 18   Temp:      TempSrc:      SpO2: 99% 100% 100% 98%  Weight:      Height:       Physical Exam General: Well appearing man, NAD, room air Heart: RRR; no murmur Lungs: CTA anteriorly Abdomen: obese, soft, non-tender Extremities: No LE edema; R foot bandaged Dialysis Access: L femoral TDC  Additional Objective Labs: Basic Metabolic Panel: Recent Labs  Lab 01/30/23 0722 01/31/23 0818 02/01/23 0949 02/02/23 0810  NA 131* 130* 131* 132*  K 5.5* 5.2* 5.2* 5.8*  CL 96* 95* 93* 96*  CO2 15* 14* 14* 14*  GLUCOSE 85 107* 80 74  BUN 107* 111* 116* 123*  CREATININE 20.75* 22.18* 22.65* 24.66*  CALCIUM 8.3* 8.3* 8.5* 8.6*  PHOS 6.3*  --  9.2* 10.6*   Liver Function Tests: Recent Labs  Lab 01/27/23 1114 01/28/23 0630 01/30/23 0722 02/01/23 0949 02/02/23 0810  AST 50* 34  --   --   --   ALT 53* 46*  --   --   --   ALKPHOS 149* 120  --   --   --   BILITOT 0.8 1.2  --   --   --   PROT 7.5 7.2  --   --   --   ALBUMIN 2.9* 2.9* 2.8* 2.8* 2.8*   CBC: Recent Labs  Lab 01/27/23 1114 01/28/23 0630 01/29/23 0833 01/30/23 0505 01/31/23 0818 02/01/23 0949 02/02/23 0810  WBC 5.6   < > 9.4 11.1* 8.9 8.4 7.7  NEUTROABS 4.4  --   --   --   --   --   --   HGB 8.5*   < > 8.0* 8.2* 7.9* 8.3* 8.2*  HCT 26.8*   < > 24.9* 25.5* 24.4* 25.5* 24.9*  MCV 86.7   < > 86.2 84.7 84.4 86.1 85.6  PLT 214   < > 271 303 306 316 328   < > = values in this interval not displayed.   Medications:  sodium chloride Stopped (01/30/23 2358)    ceFAZolin (ANCEF) IV 1  g (02/01/23 2313)    (feeding supplement) PROSource Plus  30 mL Oral BID BM   Chlorhexidine Gluconate Cloth  6 each Topical Daily   Chlorhexidine Gluconate Cloth  6 each Topical Q0600   darbepoetin (ARANESP) injection - DIALYSIS  150 mcg Subcutaneous Q Thu-1800   ferric citrate  630 mg Oral TID WC   metroNIDAZOLE  500 mg Oral Q12H   midodrine  15 mg Oral TID WC   sodium bicarbonate  1,300 mg Oral TID   sodium zirconium cyclosilicate  10 g Oral Once   vancomycin variable dose per unstable renal function (pharmacist dosing)   Does not apply See admin instructions    Dialysis Orders: TTS at [facility] 4.5hr, 500/500, EDW 137kg, 2K/2Ca bath, TDC L femoral, heparin 5000 initial + 2500 mid run bolus - Parsabiv 7.5mg  IV q HD - Mircera IV q 2 weeks -  last 8/6   Assessment/Plan: Proteus bacteremia: Blood Cx 8/20 positive (1 of 2), repeat 8/22 negative. Source felt either [facility] or foot wound. Old TDC removed and new one replaced by IR 8/22 but was non-functional. ID following - on Cefazolin, Vancomycin, Metronidazole - course unclear at this time. Dialysis access failure: Running out of options unfortunately, has been on HD ~20 years. Known central venous disease/SVC stenosis. Appreciate IR assistance - s/p revascularization to L iliac vein and new TDC placed this AM. Long-term plan is for HeRO graft once done with antibiotic course. Chronic R foot wound: Osteomyelitis s/p R toe amputation 7/5, not healing. S/p foot debridement 8/25. ESRD: Usual TTS schedule - line issues as above. For HD today with new line - fingers crossed!  Hypotension/volume: Chronically low BP on midodrine 15mg  TID - volume up, UF as tolerated. Anemia of ESRD: Hgb 8.2 - stable, continue Aranesp 150 mg q Thursday while here. Secondary HTPH: CorrCa ok, Phos way up. Continue Auryxia as binder. Parsabiv not formulary here. Nutrition: Alb 2.8 - continue supps. pAF: Eliquis on hold for now - per  primary. OSA Adjustment disorder: Mood low/depressed in setting of line issues with HD. Follow closely.  Ozzie Hoyle, PA-C 02/02/2023, 11:01 AM  [employer]  Seen and examined independently earlier today.  Agree with note and exam as documented above by physician extender and as noted here.  He shared with me that I was bothering him with my questions - which were directed at his access plans and respiratory status   General adult male in bed in no acute distress, obese body habitus HEENT normocephalic atraumatic extraocular movements intact sclera anicteric Neck supple trachea midline Lungs clear to auscultation bilaterally normal work of breathing at rest  Heart S1S2 no rub Abdomen soft nontender nondistended Extremities trace edema  Psych frustrated but conversant  Neuro alert and oriented x 3 provides hx and follows commands  Access left thigh catheter in place  ESRD - patient is s/p revascularization of occluded left iliac vein, with PTA and venous stenting for restoration of flow. PTA of IVC stenosis, infrarenal.  Exchange of tunneled HD catheter.  HD today after access then back on TTS schedule as we are able    Estanislado Emms, MD 3:39 PM 02/02/2023

## 2023-02-02 NOTE — Progress Notes (Signed)
Pharmacy Antibiotic Note  [name] is a 47 y.o. male admitted on 01/27/2023  with known history of MRSA foot osteo/abscess recently treated with Vancomycin for 4 weeks with HD (EOT ~8/2). This admission found to have proteus mirabilis bacteremia - possible sources include foot osteo vs femoral HD cath. Pharmacy has been consulted to resume Vancomycin + Flagyl, narrow Rocephin to Cefazolin.   Underwent trans-hepatic TDC placement today in IR. Last vanc random on 8/25 was 18 mcg/mL which is within goal of 15-25 mcg/mL. Estimated postHD vanc level to be subtherapeutic at ~11. S/p I&D on 8/25 and cultures collected.   Plan: - Vancomycin 1000mg  IV x1 today - Continue Cefazolin 1g IV every 24 hours - Continue Flagyl 500 mg po bid - Will continue to follow HD schedule/duration, culture results, LOT, and antibiotic de-escalation plans   Height: 6\' 2"  (188 cm) Weight: (!) 144.3 kg (318 lb 2 oz) IBW/kg (Calculated) : 82.2  Temp (24hrs), Avg:98 F (36.7 C), Min:97.6 F (36.4 C), Max:98.6 F (37 C)  Recent Labs  Lab 01/27/23 1125 01/27/23 1523 01/27/23 1742 01/27/23 2140 01/29/23 0833 01/30/23 0505 01/30/23 0722 01/31/23 0818 02/01/23 0949 02/02/23 0810  WBC  --   --   --    < > 9.4 11.1*  --  8.9 8.4 7.7  CREATININE  --   --   --    < >  --  20.48* 20.75* 22.18* 22.65* 24.66*  LATICACIDVEN 2.2* 1.0 1.0  --   --   --   --   --   --   --   VANCORANDOM  --   --   --    < > 24  --   --   --  18  --    < > = values in this interval not displayed.    Estimated Creatinine Clearance: 5.5 mL/min (A) (by C-G formula based on SCr of 24.66 mg/dL (H)).    No Known Allergies  Antimicrobials this admission: Cefepime 8/20 >> Rocephin 8/20 >> 8/22 Flagyl 8/20 >> 8/21; restart 8/22 Vancomycin 8/20 >> Cefazolin 8/23 >> 8/22 Cefazolin 8/23 >>   Dose adjustments this admission: N/a  Microbiology results: 8/20 COVID >> neg 8/20 MRSA PCR >> neg 8/20 BCx >> 1/4 Proteus mirabilis 8/22  BCx >> ngtd 8/25 R metatarsal bone: ngtd 8/25 R foot wound: ngtd  Thank you for allowing pharmacy to be a part of this patient's care.  Rexford Maus, PharmD, BCPS 02/02/2023 2:43 PM  **Pharmacist phone directory can now be found on [email or web address] ([email or web address]).  Listed under [facility].

## 2023-02-02 NOTE — Progress Notes (Signed)
ID brief note  Patient was in IR for procedure when attempted to see this am   8/25 wound cx     Component 1 d ago  Specimen Description TISSUE  Special Requests right metatarsal bone  Gram Stain RARE GRAM POSITIVE COCCI NO WBC SEEN  Culture NO GROWTH < 24 HOURS Performed at [facility], [address], [place], [place] [address]  Report Status PENDING  Resulting Agency [facility]          Will see him tomorrow.   [name], MD Infectious Disease Physician [facility] [address] [place], [place] [address] Phone: [phone]  Fax: [phone]

## 2023-02-02 NOTE — Progress Notes (Signed)
  Subjective:  Patient ID: [name], male    DOB: [birth year],  MRN: [id number]  A 47 y.o. male presents  with right foot infection status post right foot incision and drainage washout with application of skin graft with wound VAC.  He states he is doing well no acute complaints bandages clean dry and intact Objective:   Vitals:   02/02/23 1030 02/02/23 1035  BP: 123/85 133/89  Pulse: 74 77  Resp: 14 18  Temp:    SpO2: 100% 98%   General AA&O x3. Normal mood and affect.  Vascular Dorsalis pedis and posterior tibial pulses 2/4 bilat. Brisk capillary refill to all digits. Pedal hair present.  Neurologic Epicritic sensation grossly intact.  Dermatologic Bandages clean dry and intact.  Motor or sensory functions are intact.  No calf pain.  Orthopedic: MMT 5/5 in dorsiflexion, plantarflexion, inversion, and eversion. Normal joint ROM without pain or crepitus.    Assessment & Plan:  Patient was evaluated and treated and all questions answered.  Right foot infection status post right foot incision drainage washout debridement with application of skin graft and wound VAC -All questions and concerns were discussed with the patient extensive detail -Patient is okay to be discharged from podiatric standpoint.  Patient will need further antibiotics pending cultures and biopsy of the foot. -Nonweightbearing to the right lower extremity -He will need home health for wound care and VAC changes -Patient will follow-up with podiatry 1 week from discharge.  Monday Wednesday Friday VAC changes.  Candelaria Stagers, DPM  Accessible via secure chat for questions or concerns.

## 2023-02-02 NOTE — Procedures (Addendum)
Interventional Radiology Procedure Note  Procedure:   Left LE venogram Revascularization of occluded left iliac vein, with PTA and venous stenting for restoration of flow. PTA of IVC stenosis, infrarenal.  Exchange of tunneled HD catheter. 28cm tip to cuff.    Meds: 6000u IV heparin   Complications: None  Recommendations:  - Ok to use tunneled HD cath - Do not submerge - Routine line care  - routine wound care - can advance diet per primary order  Signed,  Yvone Neu. Loreta Ave, DO, ABVM, RPVI

## 2023-02-02 NOTE — Progress Notes (Signed)
Talk with MD Malen Gauze concerning  pt.TX duration of 4.5 hours and MD Malen Gauze want the hours to be 4 hours.

## 2023-02-02 NOTE — Progress Notes (Signed)
   02/02/23 0929  Spiritual Encounters  Type of Visit Initial  Care provided to: Patient  Referral source Physician  Reason for visit Routine spiritual support  OnCall Visit No  Interventions  Spiritual Care Interventions Made Established relationship of care and support;Compassionate presence;Reflective listening;Encouragement  Intervention Outcomes  Outcomes Connection to spiritual care;Awareness of support  Spiritual Care Plan  Spiritual Care Issues Still Outstanding No further spiritual care needs at this time (see row info)  Recommendations for Clinical Staff Patient did not engage with chaplain.   Patient stated that if he "wanted to talk he would talk to his family." He stated that he did not even want them here at the hospital to talk to. Chaplain reassured patient that the spiritual care team is here to listen and that sometimes talking to a stranger can help. Patient still declined to have a full visit with the chaplain.   Earleen Newport Resident [phone]

## 2023-02-02 NOTE — Plan of Care (Signed)

## 2023-02-02 NOTE — Plan of Care (Signed)
Full nephrology note to follow.   Seen and examined this AM.  I spoke with Dr. Arlean Hopping this AM who confirmed that the patient is to get a transhepatic catheter today then HD after that.  Spoke with HD charge to let her know.  Floor RN is on phone with IR on my arrival.  He is quite frustrated with basic questions  Estanislado Emms, MD 8:18 AM 02/02/2023

## 2023-02-02 NOTE — TOC CM/SW Note (Signed)
Transition of Care [facility]) - Inpatient Brief Assessment   Patient Details  Name: [name] MRN: [id number] Date of Birth: 1976-05-05  Transition of Care [facility]) CM/SW Contact:    Tom-Johnson, Hershal Coria, RN Phone Number: 02/02/2023, 5:06 PM   Clinical Narrative:  Patient presented to the ED with Clogged Femoral Fistula and Hypotension after outpatient Dialysis session.   Admitted for Septic Shock. On IV abx.  Blood cultures showed Proteus,  ID following.   Patient underwent I&D to Rt Foot infection on 02/01/23, Podiatry following.. Vascular surgery following, underwent Transhepatic Cath placement today.    Patient not Medically ready for discharge.  CM will continue to follow as patient progresses with care towards discharge.       Transition of Care Asessment: Insurance and Status: Insurance coverage has been reviewed Patient has primary care physician: Yes Home environment has been reviewed: Yes Prior level of function:: Independent Prior/Current Home Services: No current home services Social Determinants of Health Reivew: SDOH reviewed no interventions necessary Readmission risk has been reviewed: Yes Transition of care needs: transition of care needs identified, TOC will continue to follow

## 2023-02-02 NOTE — Progress Notes (Signed)
PROGRESS NOTE  [name]  ZOX:[id number] DOB: 1975/07/24 DOA: 01/27/2023 PCP: [name], MD   Brief Narrative: Patient is a 47 year old male with history of ESRD, failed renal transplant, right lower extremity osteomyelitis status post first ray transmetatarsal amputation who was brought from outpatient dialysis center for further evaluation of hypotension.  He was recently treated with prolonged course of antibiotics for right lower extremity osteomyelitis .patient was also having thick drainage from his left femoral tunneled catheter for 3 days.  Patient was suspected to have septic shock and admitted under PCCM service, started on pressors.  Blood cultures showed proteus.  ID, nephrology, vascular surgery following.  Patient transferred to [facility] service on 8/22.  Plan for transhepatic catheter placement for dialysis by IR  Assessment & Plan:  Active Problems:   Paroxysmal atrial fibrillation (HCC)   Septic shock (HCC)   Chronic anticoagulation   Gram-negative bacteremia   ESRD on hemodialysis (HCC)   Non-healing wound of lower extremity   Chronic osteomyelitis of right foot with draining sinus (HCC)   Septic shock/gram-negative bacteremia/HD line infection: Presented with hypotension.  Patient was also having thick drainage from his left femoral tunneled catheter for 3 days.  Patient was suspected to have septic shock and admitted under PCCM service, started on pressors.  Blood cultures showing Proteus.  Currently on ceftriaxone, Vanco, Flagyl.  Continue follow-up final culture report.  Repeat blood cultures have been negative Sepsis physiology has resolved, lactic acidosis has resolved .currently blood pressure stable.  Patient has chronic hypotension and is on midodrine.  ESRD: Nephrology following.  Concern for HD line infection.  Patient has access in the left femoral area.  Complicated vascular access situation.  Vascular surgery following,he was  planned for graft placement  in few weeks.  Currently he has no options for tunneled dialysis catheter placement.  Vascular surgery discussed with IR.Underwent  fluoroscopy guided exchange of left femoral hemodialysis catheter by IR on 8/22.now the plan is for transhepatic catheter placement as current catheter is not working.We will continue phosphate binders, has hyperkalemia.  Continue Lokelma  Right lower extremity osteomyelitis: Status post right first ray amputation.  MRI shows changes of osteomyelitis.   Continue current antibiotics.ID following.  Podiatry following,s/p irrigation and debridement of right foot, allograft application, wound VAC application on 8/25  Hyponatremia: Likely from volume overload.  Volume management as per dialysis  Anemia of chronic disease: Continue to monitor hemoglobin.  Transfuse if hemoglobin drops less than 7  Paroxysmal A-fib: Currently in normal sinus rhythm.  Monitor on telemetry.  Currently Eliquis is on hold.  On heparin drip for possible upcoming procedures  Hyperglycemia: A1c of 5.9 as per 6/24.  Monitor blood sugars  Morbid obesity: BMI 40       DVT prophylaxis:iv heparin     Code Status: Full Code  Family Communication: called and discussed with mother Darl Pikes on phone on 8/23  Patient status:Inpatient  Patient is from :home  Anticipated discharge WJ:XBJY  Estimated DC date:not sure   Consultants: ID, PCCM, nephrology, vascular surgery  Procedures: Dialysis, exchange of dialysis catheter  Antimicrobials:  Anti-infectives (From admission, onward)    Start     Dose/Rate Route Frequency Ordered Stop   02/02/23 0947  ceFAZolin (ANCEF) IVPB 2g/100 mL premix        over 30 Minutes Intravenous Continuous PRN 02/02/23 0947     02/01/23 1505  vancomycin (VANCOCIN) powder  Status:  Discontinued          As  needed 02/01/23 1505 02/01/23 1520   02/01/23 1505  gentamicin (GARAMYCIN) injection  Status:  Discontinued          As needed 02/01/23 1505 02/01/23 1520    01/30/23 2200  ceFAZolin (ANCEF) IVPB 1 g/50 mL premix        1 g 100 mL/hr over 30 Minutes Intravenous Every 24 hours 01/30/23 1413     01/29/23 2200  metroNIDAZOLE (FLAGYL) tablet 500 mg        500 mg Oral Every 12 hours 01/29/23 1509     01/29/23 2200  cefTRIAXone (ROCEPHIN) 2 g in sodium chloride 0.9 % 100 mL IVPB  Status:  Discontinued        2 g 200 mL/hr over 30 Minutes Intravenous Every 24 hours 01/29/23 1548 01/30/23 1413   01/29/23 1549  vancomycin variable dose per unstable renal function (pharmacist dosing)         Does not apply See admin instructions 01/29/23 1549     01/29/23 0600  ceFEPIme (MAXIPIME) 1 g in sodium chloride 0.9 % 100 mL IVPB  Status:  Discontinued        1 g 200 mL/hr over 30 Minutes Intravenous Every 24 hours 01/28/23 1006 01/29/23 1548   01/28/23 1600  ceFEPIme (MAXIPIME) 1 g in sodium chloride 0.9 % 100 mL IVPB  Status:  Discontinued        1 g 200 mL/hr over 30 Minutes Intravenous Every 24 hours 01/27/23 1715 01/27/23 1759   01/28/23 0400  ceFEPIme (MAXIPIME) 2 g in sodium chloride 0.9 % 100 mL IVPB  Status:  Discontinued        2 g 200 mL/hr over 30 Minutes Intravenous Every 12 hours 01/27/23 1759 01/28/23 1006   01/27/23 1811  vancomycin variable dose per unstable renal function (pharmacist dosing)  Status:  Discontinued         Does not apply See admin instructions 01/27/23 1811 01/28/23 0959   01/27/23 1445  metroNIDAZOLE (FLAGYL) IVPB 500 mg  Status:  Discontinued        500 mg 100 mL/hr over 60 Minutes Intravenous Every 12 hours 01/27/23 1434 01/28/23 1010   01/27/23 1445  ceFEPIme (MAXIPIME) 1 g in sodium chloride 0.9 % 100 mL IVPB        1 g 200 mL/hr over 30 Minutes Intravenous  Once 01/27/23 1440 01/27/23 1618   01/27/23 1115  cefTRIAXone (ROCEPHIN) 2 g in sodium chloride 0.9 % 100 mL IVPB        2 g 200 mL/hr over 30 Minutes Intravenous  Once 01/27/23 1101 01/27/23 1212   01/27/23 1115  vancomycin (VANCOCIN) 2,500 mg in sodium chloride 0.9  % 500 mL IVPB        2,500 mg 262.5 mL/hr over 120 Minutes Intravenous  Once 01/27/23 1108 01/27/23 1534       Subjective: Patient seen and examined at bedside today.  Lying in bed.  Denies any new complaints.  He looks frustrated with his problems.  Denies any pain on the right foot.  Wound VAC in place.  On room air  Objective: Vitals:   02/02/23 1020 02/02/23 1025 02/02/23 1030 02/02/23 1035  BP: 119/85 (!) 119/91 123/85 133/89  Pulse: 75 75 74 77  Resp: 18 19 14 18   Temp:      TempSrc:      SpO2: 99% 100% 100% 98%  Weight:      Height:        Intake/Output Summary (Last  24 hours) at 02/02/2023 1042 Last data filed at 02/02/2023 0900 Gross per 24 hour  Intake 560 ml  Output 30 ml  Net 530 ml   Filed Weights   01/30/23 0847 01/31/23 0715 02/02/23 0515  Weight: (!) 141.8 kg (!) 143.2 kg (!) 144.3 kg    Examination:  General exam: Overall comfortable, not in distress,morbidly obese HEENT: PERRL Respiratory system:  no wheezes or crackles  Cardiovascular system: S1 & S2 heard, RRR.  Gastrointestinal system: Abdomen is obese, soft and nontender. Central nervous system: Alert and oriented Extremities: Femoral dialysis catheter in the left groin, dressing on the right wound with wound VAC. Skin: No rashes, no ulcers,no icterus     Data Reviewed: I have personally reviewed following labs and imaging studies  CBC: Recent Labs  Lab 01/27/23 1114 01/28/23 0630 01/29/23 0833 01/30/23 0505 01/31/23 0818 02/01/23 0949 02/02/23 0810  WBC 5.6   < > 9.4 11.1* 8.9 8.4 7.7  NEUTROABS 4.4  --   --   --   --   --   --   HGB 8.5*   < > 8.0* 8.2* 7.9* 8.3* 8.2*  HCT 26.8*   < > 24.9* 25.5* 24.4* 25.5* 24.9*  MCV 86.7   < > 86.2 84.7 84.4 86.1 85.6  PLT 214   < > 271 303 306 316 328   < > = values in this interval not displayed.   Basic Metabolic Panel: Recent Labs  Lab 01/28/23 0630 01/30/23 0505 01/30/23 0722 01/31/23 0818 02/01/23 0949 02/02/23 0810  NA 130*  130* 131* 130* 131* 132*  K 5.9* 5.9* 5.5* 5.2* 5.2* 5.8*  CL 94* 95* 96* 95* 93* 96*  CO2 16* 17* 15* 14* 14* 14*  GLUCOSE 103* 85 85 107* 80 74  BUN 99* 102* 107* 111* 116* 123*  CREATININE 19.76* 20.48* 20.75* 22.18* 22.65* 24.66*  CALCIUM 8.6* 8.2* 8.3* 8.3* 8.5* 8.6*  MG 2.1  --   --   --   --   --   PHOS  --   --  6.3*  --  9.2* 10.6*     Recent Results (from the past 240 hour(s))  Culture, blood (Routine x 2)     Status: Abnormal   Collection Time: 01/27/23 11:00 AM   Specimen: BLOOD  Result Value Ref Range Status   Specimen Description BLOOD RIGHT ANTECUBITAL  Final   Special Requests   Final    BOTTLES DRAWN AEROBIC AND ANAEROBIC Blood Culture adequate volume   Culture  Setup Time   Final    GRAM NEGATIVE RODS IN BOTH AEROBIC AND ANAEROBIC BOTTLES CRITICAL RESULT CALLED TO, READ BACK BY AND VERIFIED WITH: V BRYK,PHARMD@[id number]  01/28/23 MK Performed at [facility], [address], [place], [place] [address]    Culture PROTEUS MIRABILIS (A)  Final   Report Status 01/30/2023 FINAL  Final   Organism ID, Bacteria PROTEUS MIRABILIS  Final      Susceptibility   Proteus mirabilis - MIC*    AMPICILLIN <=2 SENSITIVE Sensitive     CEFEPIME <=0.12 SENSITIVE Sensitive     CEFTAZIDIME <=1 SENSITIVE Sensitive     CEFTRIAXONE <=0.25 SENSITIVE Sensitive     CIPROFLOXACIN <=0.25 SENSITIVE Sensitive     GENTAMICIN <=1 SENSITIVE Sensitive     IMIPENEM 2 SENSITIVE Sensitive     TRIMETH/SULFA <=20 SENSITIVE Sensitive     AMPICILLIN/SULBACTAM <=2 SENSITIVE Sensitive     PIP/TAZO <=4 SENSITIVE Sensitive     *  PROTEUS MIRABILIS  Blood Culture ID Panel (Reflexed)     Status: Abnormal   Collection Time: 01/27/23 11:00 AM  Result Value Ref Range Status   Enterococcus faecalis NOT DETECTED NOT DETECTED Final   Enterococcus Faecium NOT DETECTED NOT DETECTED Final   Listeria monocytogenes NOT DETECTED NOT DETECTED Final   Staphylococcus species NOT DETECTED NOT DETECTED Final    Staphylococcus aureus (BCID) NOT DETECTED NOT DETECTED Final   Staphylococcus epidermidis NOT DETECTED NOT DETECTED Final   Staphylococcus lugdunensis NOT DETECTED NOT DETECTED Final   Streptococcus species NOT DETECTED NOT DETECTED Final   Streptococcus agalactiae NOT DETECTED NOT DETECTED Final   Streptococcus pneumoniae NOT DETECTED NOT DETECTED Final   Streptococcus pyogenes NOT DETECTED NOT DETECTED Final   A.calcoaceticus-baumannii NOT DETECTED NOT DETECTED Final   Bacteroides fragilis NOT DETECTED NOT DETECTED Final   Enterobacterales DETECTED (A) NOT DETECTED Final    Comment: Enterobacterales represent a large order of gram negative bacteria, not a single organism. CRITICAL RESULT CALLED TO, READ BACK BY AND VERIFIED WITH: V BRYK,PHARMD@[id number]  01/28/23 MK    Enterobacter cloacae complex NOT DETECTED NOT DETECTED Final   Escherichia coli NOT DETECTED NOT DETECTED Final   Klebsiella aerogenes NOT DETECTED NOT DETECTED Final   Klebsiella oxytoca NOT DETECTED NOT DETECTED Final   Klebsiella pneumoniae NOT DETECTED NOT DETECTED Final   Proteus species DETECTED (A) NOT DETECTED Final    Comment: CRITICAL RESULT CALLED TO, READ BACK BY AND VERIFIED WITH: V BRYK,PHARMD@[id number]  01/28/23 MK    Salmonella species NOT DETECTED NOT DETECTED Final   Serratia marcescens NOT DETECTED NOT DETECTED Final   Haemophilus influenzae NOT DETECTED NOT DETECTED Final   Neisseria meningitidis NOT DETECTED NOT DETECTED Final   Pseudomonas aeruginosa NOT DETECTED NOT DETECTED Final   Stenotrophomonas maltophilia NOT DETECTED NOT DETECTED Final   Candida albicans NOT DETECTED NOT DETECTED Final   Candida auris NOT DETECTED NOT DETECTED Final   Candida glabrata NOT DETECTED NOT DETECTED Final   Candida krusei NOT DETECTED NOT DETECTED Final   Candida parapsilosis NOT DETECTED NOT DETECTED Final   Candida tropicalis NOT DETECTED NOT DETECTED Final   Cryptococcus neoformans/gattii NOT DETECTED NOT DETECTED  Final   CTX-M ESBL NOT DETECTED NOT DETECTED Final   Carbapenem resistance IMP NOT DETECTED NOT DETECTED Final   Carbapenem resistance KPC NOT DETECTED NOT DETECTED Final   Carbapenem resistance NDM NOT DETECTED NOT DETECTED Final   Carbapenem resist OXA 48 LIKE NOT DETECTED NOT DETECTED Final   Carbapenem resistance VIM NOT DETECTED NOT DETECTED Final    Comment: Performed at [facility], [address] [place], [place] [address]  SARS Coronavirus 2 by RT PCR (hospital order, performed in [facility] hospital lab) *cepheid single result test* Anterior Nasal Swab     Status: None   Collection Time: 01/27/23 11:42 AM   Specimen: Anterior Nasal Swab  Result Value Ref Range Status   SARS Coronavirus 2 by RT PCR NEGATIVE NEGATIVE Final    Comment: Performed at [facility], [address] [place], [place] [address]  Culture, blood (Routine x 2)     Status: None   Collection Time: 01/27/23 11:50 AM   Specimen: BLOOD RIGHT HAND  Result Value Ref Range Status   Specimen Description BLOOD RIGHT HAND  Final   Special Requests   Final    BOTTLES DRAWN AEROBIC AND ANAEROBIC Blood Culture adequate volume   Culture   Final    NO GROWTH  5 DAYS Performed at [facility], [address] [place], [place] [address]    Report Status 02/01/2023 FINAL  Final  MRSA Next Gen by PCR, Nasal     Status: None   Collection Time: 01/27/23  3:09 PM   Specimen: Nasal Mucosa; Nasal Swab  Result Value Ref Range Status   MRSA by PCR Next Gen NOT DETECTED NOT DETECTED Final    Comment: (NOTE) The GeneXpert MRSA Assay (FDA approved for NASAL specimens only), is one component of a comprehensive MRSA colonization surveillance program. It is not intended to diagnose MRSA infection nor to guide or monitor treatment for MRSA infections. Test performance is not FDA approved in patients less than 68 years old. Performed at [facility], [address], [place], [place] [address]   Culture,  blood (Routine X 2) w Reflex to ID Panel     Status: None (Preliminary result)   Collection Time: 01/29/23  8:33 AM   Specimen: BLOOD LEFT ARM  Result Value Ref Range Status   Specimen Description BLOOD LEFT ARM  Final   Special Requests   Final    BOTTLES DRAWN AEROBIC ONLY Blood Culture adequate volume   Culture   Final    NO GROWTH 3 DAYS Performed at [facility], [address], [place], [place] [address]    Report Status PENDING  Incomplete  Culture, blood (Routine X 2) w Reflex to ID Panel     Status: None (Preliminary result)   Collection Time: 01/29/23  8:35 AM   Specimen: BLOOD RIGHT HAND  Result Value Ref Range Status   Specimen Description BLOOD RIGHT HAND  Final   Special Requests   Final    BOTTLES DRAWN AEROBIC ONLY Blood Culture adequate volume   Culture   Final    NO GROWTH 3 DAYS Performed at [facility], [address], [place], [place] [address]    Report Status PENDING  Incomplete  Aerobic/Anaerobic Culture w Gram Stain (surgical/deep wound)     Status: None (Preliminary result)   Collection Time: 02/01/23  3:07 PM   Specimen: Foot, Right; Tissue  Result Value Ref Range Status   Specimen Description TISSUE  Final   Special Requests right metatarsal bone  Final   Gram Stain RARE GRAM POSITIVE COCCI NO WBC SEEN   Final   Culture   Final    NO GROWTH < 24 HOURS Performed at [facility], [address], [place], [place] [address]    Report Status PENDING  Incomplete  Aerobic/Anaerobic Culture w Gram Stain (surgical/deep wound)     Status: None (Preliminary result)   Collection Time: 02/01/23  3:08 PM   Specimen: Path fluid; Amputation  Result Value Ref Range Status   Specimen Description WOUND  Final   Special Requests right foot  Final   Gram Stain   Final    RARE WBC PRESENT, PREDOMINANTLY PMN NO ORGANISMS SEEN    Culture   Final    NO GROWTH < 24 HOURS Performed at [facility], [address], [place], [place]  [address]    Report Status PENDING  Incomplete     Radiology Studies: PERIPHERAL VASCULAR CATHETERIZATION  Result Date: 02/01/2023 See surgical note for result.   Scheduled Meds:  (feeding supplement) PROSource Plus  30 mL Oral BID BM   Chlorhexidine Gluconate Cloth  6 each Topical Daily   Chlorhexidine Gluconate Cloth  6 each Topical Q0600  darbepoetin (ARANESP) injection - DIALYSIS  150 mcg Subcutaneous Q Thu-1800   ferric citrate  630 mg Oral TID WC   metroNIDAZOLE  500 mg Oral Q12H   midodrine  15 mg Oral TID WC   sodium bicarbonate  1,300 mg Oral TID   vancomycin variable dose per unstable renal function (pharmacist dosing)   Does not apply See admin instructions   Continuous Infusions:  sodium chloride Stopped (01/30/23 2358)    ceFAZolin (ANCEF) IV 1 g (02/01/23 2313)   ceFAZolin       LOS: 6 days   Burnadette Pop, MD [facility] P8/26/2024, 10:42 AM

## 2023-02-02 NOTE — Consult Note (Signed)
WOC Nurse Consult Note: Consult requested for Vac changes to right foot.  Pt is followed by the podiatry team and they performed surgery yesterday and applied the Vac dressing at that time.  Discussed plan of care via secure chat and WOC team will begin dressing changes as requested on Wed, 8/28. Thank-you,  Cammie Mcgee MSN, RN, CWOCN, [place], CNS [phone]

## 2023-02-02 NOTE — Procedures (Signed)
HD Note:  Some information was entered later than the data was gathered due to patient care needs. The stated time with the data is accurate.  Received patient in bed to unit.   Alert and oriented.   Informed consent signed and in chart.   Access used: Left Femoral HD catheter Access issues: None  Patient tolerated treatment well. Patient slept and watched TV.  TX duration: 4 hours.  Total UF removed: 3000 ml  Hand-off given to patient's nurse.   Transported back to the room   Macy Lingenfelter L. Dareen Piano, RN Kidney Dialysis Unit.

## 2023-02-02 NOTE — Progress Notes (Signed)
Pt refused CPAP for night time use. Says he has never worn a CPAP and does not wish to.   02/02/23 2011  BiPAP/CPAP/SIPAP  Reason BIPAP/CPAP not in use Non-compliant

## 2023-02-03 ENCOUNTER — Other Ambulatory Visit: Payer: Self-pay

## 2023-02-03 DIAGNOSIS — B964 Proteus (mirabilis) (morganii) as the cause of diseases classified elsewhere: Secondary | ICD-10-CM | POA: Diagnosis not present

## 2023-02-03 DIAGNOSIS — T8740 Infection of amputation stump, unspecified extremity: Secondary | ICD-10-CM | POA: Diagnosis not present

## 2023-02-03 DIAGNOSIS — R7881 Bacteremia: Secondary | ICD-10-CM | POA: Diagnosis not present

## 2023-02-03 LAB — RENAL FUNCTION PANEL
Albumin: 2.7 g/dL — ABNORMAL LOW (ref 3.5–5.0)
Albumin: 2.8 g/dL — ABNORMAL LOW (ref 3.5–5.0)
Anion gap: 15 (ref 5–15)
Anion gap: 15 (ref 5–15)
BUN: 68 mg/dL — ABNORMAL HIGH (ref 6–20)
BUN: 42 mg/dL — ABNORMAL HIGH (ref 6–20)
CO2: 23 mmol/L (ref 22–32)
CO2: 24 mmol/L (ref 22–32)
Calcium: 8.2 mg/dL — ABNORMAL LOW (ref 8.9–10.3)
Calcium: 8.6 mg/dL — ABNORMAL LOW (ref 8.9–10.3)
Chloride: 95 mmol/L — ABNORMAL LOW (ref 98–111)
Chloride: 93 mmol/L — ABNORMAL LOW (ref 98–111)
Creatinine, Ser: 16.15 mg/dL — ABNORMAL HIGH (ref 0.61–1.24)
Creatinine, Ser: 11.62 mg/dL — ABNORMAL HIGH (ref 0.61–1.24)
GFR, Estimated: 3 mL/min — ABNORMAL LOW (ref >60)
GFR, Estimated: 5 mL/min — ABNORMAL LOW (ref >60)
Glucose, Bld: 103 mg/dL — ABNORMAL HIGH (ref 70–99)
Glucose, Bld: 94 mg/dL (ref 70–99)
Phosphorus: 8.4 mg/dL — ABNORMAL HIGH (ref 2.5–4.6)
Phosphorus: 5.6 mg/dL — ABNORMAL HIGH (ref 2.5–4.6)
Potassium: 4.5 mmol/L (ref 3.5–5.1)
Potassium: 3.7 mmol/L (ref 3.5–5.1)
Sodium: 133 mmol/L — ABNORMAL LOW (ref 135–145)
Sodium: 132 mmol/L — ABNORMAL LOW (ref 135–145)

## 2023-02-03 LAB — HEPARIN LEVEL (UNFRACTIONATED): Heparin Unfractionated: 0.25 [IU]/mL — ABNORMAL LOW (ref 0.30–0.70)

## 2023-02-03 MED ORDER — SERTRALINE HCL 50 MG PO TABS
25.0000 mg | ORAL_TABLET | Freq: Every day | ORAL | Status: DC
  Administered 2023-02-03 – 2023-02-04 (×2): 25 mg via ORAL
  Filled 2023-02-03 (×2): qty 1

## 2023-02-03 MED ORDER — HEPARIN SODIUM (PORCINE) 1000 UNIT/ML IJ SOLN
4200.0000 [IU] | Freq: Once | INTRAMUSCULAR | Status: AC
  Administered 2023-02-03: 4200 [IU]

## 2023-02-03 MED ORDER — APIXABAN 5 MG PO TABS
5.0000 mg | ORAL_TABLET | Freq: Two times a day (BID) | ORAL | Status: DC
  Administered 2023-02-03 – 2023-02-05 (×5): 5 mg via ORAL
  Filled 2023-02-03 (×6): qty 1

## 2023-02-03 MED ORDER — HEPARIN SODIUM (PORCINE) 1000 UNIT/ML IJ SOLN
INTRAMUSCULAR | Status: AC
  Filled 2023-02-03: qty 5

## 2023-02-03 MED ORDER — VANCOMYCIN HCL IN DEXTROSE 1-5 GM/200ML-% IV SOLN
1000.0000 mg | INTRAVENOUS | Status: DC
  Administered 2023-02-03 – 2023-02-05 (×2): 1000 mg via INTRAVENOUS
  Filled 2023-02-03 (×2): qty 200

## 2023-02-03 NOTE — Progress Notes (Signed)
Received patient in bed to unit.  Alert and oriented.  Informed consent signed and in chart.   TX duration: 3.5  Patient tolerated well.  Transported back to the room  Alert, without acute distress.  Hand-off given to patient's nurse.   Access used: R Femoral catheter  Access issues:NA    02/03/23 1430  Pain Assessment  Pain Scale 0-10  Pain Score 0  Hemodialysis Catheter Left Femoral vein Double lumen Permanent (Tunneled)  Placement Date/Time: 02/02/23 1020   Placed prior to admission: No  Serial / Lot #: [id number]  Expiration Date: 12/29/26  Time Out: Correct patient;Correct site;Correct procedure  Maximum sterile barrier precautions: Hand hygiene;Cap;Mask;Sterile gow...  Site Condition No complications  Blue Lumen Status Flushed;Heparin locked;Dead end cap in place  Red Lumen Status Flushed;Dead end cap in place;Heparin locked  Purple Lumen Status N/A  Catheter fill solution Heparin 1000 units/ml  Catheter fill volume (Arterial) 2.1 cc  Catheter fill volume (Venous) 2.1  Dressing Type Transparent  Dressing Status Clean, Dry, Intact  Drainage Description None  Post treatment catheter status Capped and Clamped  Neurological  Level of Consciousness Alert  Orientation Level Oriented X4  Respiratory  Respiratory Pattern Regular;Labored  Chest Assessment Chest expansion symmetrical  Bilateral Breath Sounds Clear  R Upper  Breath Sounds Clear  L Upper Breath Sounds Diminished;Clear  R Lower Breath Sounds Clear;Diminished  L Lower Breath Sounds Clear;Diminished  Cough None  Cardiac  Pulse Regular  Heart Sounds S1, S2  Jugular Venous Distention (JVD) No  ECG Monitor Yes  Antiarrhythmic device No  Vascular  R Radial Pulse +2  L Radial Pulse +2  RLE Edema Non-pitting  LLE Edema Non-Pitting  GU Assessment  Genitourinary (WDL) X  Genitourinary Symptoms Anuria  Psychosocial  Psychosocial (WDL) WDL  Needs Expressed Denies     Total UF removed: 2L   Lerry Liner  LPN [facility]

## 2023-02-03 NOTE — Progress Notes (Addendum)
[facility] Progress Note   Subjective:  Seen at onset of HD - thankfully his TDC worked yesterday - for HD again today to get back on track schedule-wise and for additional clearance given that labs were rough yesterday. Denies CP/dyspnea. Denies foot pain - wound vac in place.   Objective Vitals:   02/03/23 0937 02/03/23 0954 02/03/23 1000 02/03/23 1030  BP: 109/75 99/82 100/63 95/74  Pulse: 80 78 78 92  Resp: 18 (!) 21 (!) 21 18  Temp: 98.7 F (37.1 C)     TempSrc: Oral     SpO2: 97% 97% 100% 99%  Weight:      Height:       Physical Exam General: Well appearing man, NAD, room air Heart: RRR; no murmur Lungs: CTA anteriorly Abdomen: obese, soft, non-tender Extremities: No LE edema; R foot bandaged with wound vac Dialysis Access: L femoral TDC  Additional Objective Labs: Basic Metabolic Panel: Recent Labs  Lab 01/30/23 0722 01/31/23 0818 02/01/23 0949 02/02/23 0810  NA 131* 130* 131* 132*  K 5.5* 5.2* 5.2* 5.8*  CL 96* 95* 93* 96*  CO2 15* 14* 14* 14*  GLUCOSE 85 107* 80 74  BUN 107* 111* 116* 123*  CREATININE 20.75* 22.18* 22.65* 24.66*  CALCIUM 8.3* 8.3* 8.5* 8.6*  PHOS 6.3*  --  9.2* 10.6*   Liver Function Tests: Recent Labs  Lab 01/27/23 1114 01/28/23 0630 01/30/23 0722 02/01/23 0949 02/02/23 0810  AST 50* 34  --   --   --   ALT 53* 46*  --   --   --   ALKPHOS 149* 120  --   --   --   BILITOT 0.8 1.2  --   --   --   PROT 7.5 7.2  --   --   --   ALBUMIN 2.9* 2.9* 2.8* 2.8* 2.8*   CBC: Recent Labs  Lab 01/27/23 1114 01/28/23 0630 01/29/23 0833 01/30/23 0505 01/31/23 0818 02/01/23 0949 02/02/23 0810  WBC 5.6   < > 9.4 11.1* 8.9 8.4 7.7  NEUTROABS 4.4  --   --   --   --   --   --   HGB 8.5*   < > 8.0* 8.2* 7.9* 8.3* 8.2*  HCT 26.8*   < > 24.9* 25.5* 24.4* 25.5* 24.9*  MCV 86.7   < > 86.2 84.7 84.4 86.1 85.6  PLT 214   < > 271 303 306 316 328   < > = values in this interval not displayed.   Blood Culture    Component  Value Date/Time   SDES WOUND 02/01/2023 1508   SPECREQUEST right foot 02/01/2023 1508   CULT  02/01/2023 1508    CULTURE REINCUBATED FOR BETTER GROWTH Performed at [facility], [address], [place], [place] [address]    REPTSTATUS PENDING 02/01/2023 1508   Studies/Results: IR Venocavagram Ivc  Result Date: 02/02/2023 INDICATION: 47 year old male with chronic renal failure, has had multiple prior surgical dialysis circuit performed, multiple prior catheter. 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 The current left femoral tunneled hemodialysis catheter is nonfunctioning, position within collateral venous drainage of the left pelvis. He presents today for possible venous reconstruction and tunneled catheter exchange, versus possible transhepatic dialysis catheter placement. EXAM: IMAGE GUIDED EXCHANGE OF TUNNELED HEMODIALYSIS CATHETER LEFT LOWER EXTREMITY VENOGRAM BALLOON ANGIOPLASTY AND STENT RECONSTRUCTION OF LEFT COMMON ILIAC VEIN  OCCLUSION MEDICATIONS: 2 g Ancef. The antibiotic was given in an appropriate time interval prior to skin puncture. 6000 units IV heparin ANESTHESIA/SEDATION: Moderate (conscious) sedation was employed during this procedure. A total of Versed 2.0 mg and Fentanyl 200 mcg was administered intravenously by the radiology nurse. Total intra-service moderate Sedation Time: 43 minutes. The patient's level of consciousness and vital signs were monitored continuously by radiology nursing throughout the procedure under my direct supervision. FLUOROSCOPY: Radiation Exposure Index (as provided by the fluoroscopic device): 908 mGy Kerma COMPLICATIONS: None PROCEDURE: Informed written consent was obtained from the patient after a discussion of the risks, benefits, and alternatives to treatment. Questions regarding the procedure were encouraged and answered. The left inguinal region  including the indwelling catheter were prepped with chlorhexidine in a sterile fashion, and a sterile drape was applied covering the operative field. Maximum barrier sterile technique with sterile gowns and gloves were used for the procedure. A timeout was performed prior to the initiation of the procedure. Spot images of the pelvis performed. 1% lidocaine was used for local anesthesia. Bentson wire was advanced through the nonfunctioning tunneled hemodialysis catheter. A standard 9 French sheath was placed on the wire. The sheath was not long enough to enter the vein for a durable purchase. A 10 French tips sheath was then placed on the Bentson wire into the iliac venous system. Dilator was removed and venogram performed. A combination of a never cross crossing catheter and a stiff Glidewire were used to cross the left common iliac vein occlusion. Once the catheter was in the IVC the wire was removed. Venogram was performed. Bentson wire was then passed into the IVC. Venogram at the sheath was performed. Balloon angioplasty was then performed of the occluded iliac vein, 12 mm x 40 mm. The sheath was then advanced into the lower IVC for repeat venogram. Sheath was withdrawn. We then placed the crossing catheter coaxial alongside the Bentson wire, for a somewhat tenia sh injection into the right common iliac vein to establish the right common iliac inflow. Catheter was removed and then a final balloon angioplasty was then performed of infrarenal IVC stenosis, 12 mm x 40 mm. We elected then 2 stent the left common iliac vein, knowing that the durability of the occluded venous segment would be poor without stenting. A 14 mm x 60 mm dedicated operate venous stent was deployed. 14 mm balloon angioplasty was then performed for post dilation. We then elected to place a 28 cm tip to cuff tunneled hemodialysis catheter, which was placed on the Bentson wire. The wire and the introducers were removed. The catheter aspirates  well at the conclusion. Final images were stored. The catheter aspirates and flushes normally. The catheter was flushed with appropriate volume heparin dwells. The catheter exit site was secured with a 0-Prolene retention suture. Dressings were applied. The patient tolerated the procedure well without immediate post procedural complication. FINDINGS: Current catheter is nonfunctional, with inability to aspirate the blue and red hubs. Venogram of the left lower extremity demonstrates patent confluence of the left internal iliac vein and external iliac vein, with occlusion of the common iliac vein. Suprarenal IVC occlusion. Flow is reversed from the IVC through the renal veins. The renal veins drain the infrarenal IVC flow via collateral network. 50% stenosis of the infrarenal IVC secondary to chronic venous disease. Balloon angioplasty and stenting of left common iliac vein, restored patency after placement of 14 mm diameter dedicated venous stent. Operational tunneled catheter at the conclusion. We  elected to place a 28 cm tip to cuff catheter. A 33 cm tip to cath catheter would also be reasonable. IMPRESSION: Status post left lower extremity venogram, angioplasty and stenting of iliac venous occlusion, angioplasty of infrarenal IVC stenosis, and replacement of left femoral vein tunneled hemodialysis catheter with a new 28 cm tip to cuff catheter. Signed, Yvone Neu. Miachel Roux, RPVI [facility] Radiology Electronically Signed   By: Gilmer Mor D.O.   On: 02/02/2023 13:03   IR Fluoro Guide CV Line Left  Result Date: 02/02/2023 INDICATION: 47 year old male with chronic renal failure, has had multiple prior surgical dialysis circuit performed, multiple prior catheter. 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 The current left femoral tunneled hemodialysis  catheter is nonfunctioning, position within collateral venous drainage of the left pelvis. He presents today for possible venous reconstruction and tunneled catheter exchange, versus possible transhepatic dialysis catheter placement. EXAM: IMAGE GUIDED EXCHANGE OF TUNNELED HEMODIALYSIS CATHETER LEFT LOWER EXTREMITY VENOGRAM BALLOON ANGIOPLASTY AND STENT RECONSTRUCTION OF LEFT COMMON ILIAC VEIN OCCLUSION MEDICATIONS: 2 g Ancef. The antibiotic was given in an appropriate time interval prior to skin puncture. 6000 units IV heparin ANESTHESIA/SEDATION: Moderate (conscious) sedation was employed during this procedure. A total of Versed 2.0 mg and Fentanyl 200 mcg was administered intravenously by the radiology nurse. Total intra-service moderate Sedation Time: 43 minutes. The patient's level of consciousness and vital signs were monitored continuously by radiology nursing throughout the procedure under my direct supervision. FLUOROSCOPY: Radiation Exposure Index (as provided by the fluoroscopic device): 908 mGy Kerma COMPLICATIONS: None PROCEDURE: Informed written consent was obtained from the patient after a discussion of the risks, benefits, and alternatives to treatment. Questions regarding the procedure were encouraged and answered. The left inguinal region including the indwelling catheter were prepped with chlorhexidine in a sterile fashion, and a sterile drape was applied covering the operative field. Maximum barrier sterile technique with sterile gowns and gloves were used for the procedure. A timeout was performed prior to the initiation of the procedure. Spot images of the pelvis performed. 1% lidocaine was used for local anesthesia. Bentson wire was advanced through the nonfunctioning tunneled hemodialysis catheter. A standard 9 French sheath was placed on the wire. The sheath was not long enough to enter the vein for a durable purchase. A 10 French tips sheath was then placed on the Bentson wire into the  iliac venous system. Dilator was removed and venogram performed. A combination of a never cross crossing catheter and a stiff Glidewire were used to cross the left common iliac vein occlusion. Once the catheter was in the IVC the wire was removed. Venogram was performed. Bentson wire was then passed into the IVC. Venogram at the sheath was performed. Balloon angioplasty was then performed of the occluded iliac vein, 12 mm x 40 mm. The sheath was then advanced into the lower IVC for repeat venogram. Sheath was withdrawn. We then placed the crossing catheter coaxial alongside the Bentson wire, for a somewhat tenia sh injection into the right common iliac vein to establish the right common iliac inflow. Catheter was removed and then a final balloon angioplasty was then performed of infrarenal IVC stenosis, 12 mm x 40 mm. We elected then 2 stent the left common iliac vein, knowing that the durability of the occluded venous segment would be poor without stenting. A 14 mm x 60 mm dedicated  operate venous stent was deployed. 14 mm balloon angioplasty was then performed for post dilation. We then elected to place a 28 cm tip to cuff tunneled hemodialysis catheter, which was placed on the Bentson wire. The wire and the introducers were removed. The catheter aspirates well at the conclusion. Final images were stored. The catheter aspirates and flushes normally. The catheter was flushed with appropriate volume heparin dwells. The catheter exit site was secured with a 0-Prolene retention suture. Dressings were applied. The patient tolerated the procedure well without immediate post procedural complication. FINDINGS: Current catheter is nonfunctional, with inability to aspirate the blue and red hubs. Venogram of the left lower extremity demonstrates patent confluence of the left internal iliac vein and external iliac vein, with occlusion of the common iliac vein. Suprarenal IVC occlusion. Flow is reversed from the IVC through  the renal veins. The renal veins drain the infrarenal IVC flow via collateral network. 50% stenosis of the infrarenal IVC secondary to chronic venous disease. Balloon angioplasty and stenting of left common iliac vein, restored patency after placement of 14 mm diameter dedicated venous stent. Operational tunneled catheter at the conclusion. We elected to place a 28 cm tip to cuff catheter. A 33 cm tip to cath catheter would also be reasonable. IMPRESSION: Status post left lower extremity venogram, angioplasty and stenting of iliac venous occlusion, angioplasty of infrarenal IVC stenosis, and replacement of left femoral vein tunneled hemodialysis catheter with a new 28 cm tip to cuff catheter. Signed, Yvone Neu. Miachel Roux, RPVI [facility] Interventional Radiology Specialists [facility] Radiology Electronically Signed   By: Gilmer Mor D.O.   On: 02/02/2023 13:03   IR PTS AND STENT ADDL CENTRAL DIALY SEG THRU DIALY CIRCUIT LEFT  Result Date: 02/02/2023 INDICATION: 47 year old male with chronic renal failure, has had multiple prior surgical dialysis circuit performed, multiple prior catheter. 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 The current left femoral tunneled hemodialysis catheter is nonfunctioning, position within collateral venous drainage of the left pelvis. He presents today for possible venous reconstruction and tunneled catheter exchange, versus possible transhepatic dialysis catheter placement. EXAM: IMAGE GUIDED EXCHANGE OF TUNNELED HEMODIALYSIS CATHETER LEFT LOWER EXTREMITY VENOGRAM BALLOON ANGIOPLASTY AND STENT RECONSTRUCTION OF LEFT COMMON ILIAC VEIN OCCLUSION MEDICATIONS: 2 g Ancef. The antibiotic was given in an appropriate time interval prior to skin puncture. 6000 units IV heparin ANESTHESIA/SEDATION: Moderate (conscious) sedation was employed during this procedure. A total of Versed  2.0 mg and Fentanyl 200 mcg was administered intravenously by the radiology nurse. Total intra-service moderate Sedation Time: 43 minutes. The patient's level of consciousness and vital signs were monitored continuously by radiology nursing throughout the procedure under my direct supervision. FLUOROSCOPY: Radiation Exposure Index (as provided by the fluoroscopic device): 908 mGy Kerma COMPLICATIONS: None PROCEDURE: Informed written consent was obtained from the patient after a discussion of the risks, benefits, and alternatives to treatment. Questions regarding the procedure were encouraged and answered. The left inguinal region including the indwelling catheter were prepped with chlorhexidine in a sterile fashion, and a sterile drape was applied covering the operative field. Maximum barrier sterile technique with sterile gowns and gloves were used for the procedure. A timeout was performed prior to the initiation of the procedure. Spot images of the pelvis performed. 1% lidocaine was used for local anesthesia. Bentson wire was advanced through the nonfunctioning tunneled hemodialysis catheter. A standard 9 French sheath was placed  on the wire. The sheath was not long enough to enter the vein for a durable purchase. A 10 French tips sheath was then placed on the Bentson wire into the iliac venous system. Dilator was removed and venogram performed. A combination of a never cross crossing catheter and a stiff Glidewire were used to cross the left common iliac vein occlusion. Once the catheter was in the IVC the wire was removed. Venogram was performed. Bentson wire was then passed into the IVC. Venogram at the sheath was performed. Balloon angioplasty was then performed of the occluded iliac vein, 12 mm x 40 mm. The sheath was then advanced into the lower IVC for repeat venogram. Sheath was withdrawn. We then placed the crossing catheter coaxial alongside the Bentson wire, for a somewhat tenia sh injection into the  right common iliac vein to establish the right common iliac inflow. Catheter was removed and then a final balloon angioplasty was then performed of infrarenal IVC stenosis, 12 mm x 40 mm. We elected then 2 stent the left common iliac vein, knowing that the durability of the occluded venous segment would be poor without stenting. A 14 mm x 60 mm dedicated operate venous stent was deployed. 14 mm balloon angioplasty was then performed for post dilation. We then elected to place a 28 cm tip to cuff tunneled hemodialysis catheter, which was placed on the Bentson wire. The wire and the introducers were removed. The catheter aspirates well at the conclusion. Final images were stored. The catheter aspirates and flushes normally. The catheter was flushed with appropriate volume heparin dwells. The catheter exit site was secured with a 0-Prolene retention suture. Dressings were applied. The patient tolerated the procedure well without immediate post procedural complication. FINDINGS: Current catheter is nonfunctional, with inability to aspirate the blue and red hubs. Venogram of the left lower extremity demonstrates patent confluence of the left internal iliac vein and external iliac vein, with occlusion of the common iliac vein. Suprarenal IVC occlusion. Flow is reversed from the IVC through the renal veins. The renal veins drain the infrarenal IVC flow via collateral network. 50% stenosis of the infrarenal IVC secondary to chronic venous disease. Balloon angioplasty and stenting of left common iliac vein, restored patency after placement of 14 mm diameter dedicated venous stent. Operational tunneled catheter at the conclusion. We elected to place a 28 cm tip to cuff catheter. A 33 cm tip to cath catheter would also be reasonable. IMPRESSION: Status post left lower extremity venogram, angioplasty and stenting of iliac venous occlusion, angioplasty of infrarenal IVC stenosis, and replacement of left femoral vein tunneled  hemodialysis catheter with a new 28 cm tip to cuff catheter. Signed, Yvone Neu. Miachel Roux, RPVI Vascular and Interventional Radiology Specialists [facility] Radiology Electronically Signed   By: Gilmer Mor D.O.   On: 02/02/2023 13:03   PERIPHERAL VASCULAR CATHETERIZATION  Result Date: 02/01/2023 See surgical note for result.  Medications:  sodium chloride Stopped (01/30/23 2358)    ceFAZolin (ANCEF) IV 1 g (02/02/23 2127)   heparin 2,600 Units/hr (02/03/23 0859)   vancomycin      (feeding supplement) PROSource Plus  30 mL Oral BID BM   Chlorhexidine Gluconate Cloth  6 each Topical Daily   Chlorhexidine Gluconate Cloth  6 each Topical Q0600   Chlorhexidine Gluconate Cloth  6 each Topical Q0600   darbepoetin (ARANESP) injection - DIALYSIS  150 mcg Subcutaneous Q Thu-1800   ferric citrate  630 mg Oral TID WC   metroNIDAZOLE  500 mg Oral Q12H   midodrine  15 mg Oral TID WC   sodium bicarbonate  1,300 mg Oral TID    Dialysis Orders: TTS at [facility] 4.5hr, 500/500, EDW 137kg, 2K/2Ca bath, TDC L femoral, heparin 5000 initial + 2500 mid run bolus - Parsabiv 7.5mg  IV q HD - Mircera IV q 2 weeks - last 8/6   Assessment/Plan: Proteus bacteremia: Blood Cx 8/20 positive (1 of 2), repeat 8/22 negative. Source felt either [facility] or foot wound. Old TDC removed and new one replaced by IR 8/22 but was non-functional. ID following - on Cefazolin, Vancomycin, Metronidazole - course unclear at this time. Dialysis access failure: Running out of options unfortunately, has been on HD ~20 years. Known central venous disease/SVC stenosis. Appreciate IR assistance - s/p revascularization to L iliac vein and new TDC placed 8/26 which is working! Long-term plan is for HeRO graft once done with antibiotic course. Chronic R foot wound: Osteomyelitis s/p R toe amputation 7/5, not healing. S/p foot debridement 8/25. ESRD: Usual TTS schedule - line issues as above. HD 8/26, then HD again today  to get back on TTS schedule. Stop bicarb. Hypotension/volume: Chronically low BP on midodrine 15mg  TID - volume up, UF as tolerated. Anemia of ESRD: Hgb 8.2 - stable, continue Aranesp 150 mg q Thursday while here. Secondary HTPH: CorrCa ok, Phos way up. Continue Auryxia as binder. Parsabiv not formulary here. Nutrition: Alb 2.8 - continue supps. pAF: Eliquis on hold for now - per primary. OSA Adjustment disorder: Mood low/depressed in setting of line issues with HD. Follow closely.    Robert Hoyle, PA-C 02/03/2023, 10:47 AM  [facility]   Seen and examined independently.  Agree with note and exam as documented above by physician extender and as noted here.  See also procedure note from today.  Estanislado Emms, MD 02/03/2023  12:27 PM

## 2023-02-03 NOTE — Progress Notes (Signed)
Called for report, nurse in room unable to answer telephone and give report, they will tell her to call back when she is out of pt rooms.

## 2023-02-03 NOTE — Progress Notes (Signed)
PROGRESS NOTE  [name]  ZOX:[id number] DOB: 11-Aug-1975 DOA: 01/27/2023 PCP: [name], MD   Brief Narrative: Patient is a 47 year old male with history of ESRD, failed renal transplant, right lower extremity osteomyelitis status post first ray transmetatarsal amputation who was brought from outpatient dialysis center for further evaluation of hypotension.  He was recently treated with prolonged course of antibiotics for right lower extremity osteomyelitis .patient was also having thick drainage from his left femoral tunneled catheter for 3 days.  Patient was suspected to have septic shock and admitted under PCCM service, started on pressors.  Blood cultures showed proteus.  ID, nephrology, vascular surgery following.  Patient transferred to [facility] service on 8/22.  Nephrology, ID following.    Assessment & Plan:  Active Problems:   Paroxysmal atrial fibrillation (HCC)   Septic shock (HCC)   Chronic anticoagulation   Gram-negative bacteremia   ESRD on hemodialysis (HCC)   Non-healing wound of lower extremity   Chronic osteomyelitis of right foot with draining sinus (HCC)   Septic shock/gram-negative bacteremia/HD line infection: Presented with hypotension.  Patient was also having thick drainage from his left femoral tunneled catheter for 3 days.  Patient was suspected to have septic shock and admitted under PCCM service, started on pressors.  Blood cultures showed Proteus.  Currently on ceftriaxone, Vanco, Flagyl to cover for both bacteremia and osteomyelitis.Sepsis physiology has resolved, lactic acidosis has resolved .currently blood pressure stable.  Patient has chronic hypotension and is on midodrine.  ESRD: Nephrology following.  Concern for HD line infection.  Patient had access in the left femoral area.Vascular surgery was following,he was  planned for graft placement in few weeks.   Vascular surgery discussed with IR.Underwent  fluoroscopy guided exchange of left femoral  hemodialysis catheter by IR on 8/22.status post left lower extremity venogram, revascularization of occluded left iliac vein with PTA, venous stenting, exchange of tunneled HD catheter on 8/26 by IR.  Hemodialysis catheter now working. We will continue phosphate binders.  Right lower extremity osteomyelitis: Status post right first ray amputation.  MRI showed changes of osteomyelitis.   .ID following.  S/p irrigation and debridement of right foot, allograft application, wound VAC application on 8/25.  Wound cultures showing rare diabetic cocci.  Currently in progress for antibiotics.  Will follow-up with ID regarding final recommendation on choice of antibiotics and duration.  Patient should follow up with podiatry as an outpatient, will be discharged on wound VAC when ready  Hyponatremia: Likely from volume overload.  Volume management as per dialysis  Anemia of chronic disease: Continue to monitor hemoglobin.  Transfuse if hemoglobin drops less than 7  Paroxysmal A-fib: Currently in normal sinus rhythm.  Monitor on telemetry.  Currently Eliquis is on hold.  On heparin drip for possible upcoming procedures,will resume eliquis now anticipating no further operative intervention  Hyperglycemia: A1c of 5.9 as per 6/24.  Monitor blood sugars  Morbid obesity: BMI 40  Depressed mood: Patient noted to be depressed, apathetic due to his current medical conditions.  Started on low-dose Zoloft       DVT prophylaxis:iv heparin     Code Status: Full Code  Family Communication: called and discussed with mother Darl Pikes on phone on 8/23  Patient status:Inpatient  Patient is from :home  Anticipated discharge WJ:XBJY  Estimated DC date: After clearance from ID, nephrology   Consultants: ID, PCCM, nephrology, vascular surgery  Procedures: Dialysis, exchange of dialysis catheter  Antimicrobials:  Anti-infectives (From admission, onward)    Start  Dose/Rate Route Frequency Ordered Stop    02/03/23 1200  vancomycin (VANCOCIN) IVPB 1000 mg/200 mL premix        1,000 mg 200 mL/hr over 60 Minutes Intravenous Every T-Th-Sa (Hemodialysis) 02/03/23 0953     02/02/23 1500  vancomycin (VANCOCIN) IVPB 1000 mg/200 mL premix        1,000 mg 200 mL/hr over 60 Minutes Intravenous Once in dialysis 02/02/23 1444 02/02/23 1651   02/02/23 0947  ceFAZolin (ANCEF) IVPB 2g/100 mL premix        over 30 Minutes Intravenous Continuous PRN 02/02/23 0947 02/02/23 0947   02/01/23 1505  vancomycin (VANCOCIN) powder  Status:  Discontinued          As needed 02/01/23 1505 02/01/23 1520   02/01/23 1505  gentamicin (GARAMYCIN) injection  Status:  Discontinued          As needed 02/01/23 1505 02/01/23 1520   01/30/23 2200  ceFAZolin (ANCEF) IVPB 1 g/50 mL premix        1 g 100 mL/hr over 30 Minutes Intravenous Every 24 hours 01/30/23 1413     01/29/23 2200  metroNIDAZOLE (FLAGYL) tablet 500 mg        500 mg Oral Every 12 hours 01/29/23 1509     01/29/23 2200  cefTRIAXone (ROCEPHIN) 2 g in sodium chloride 0.9 % 100 mL IVPB  Status:  Discontinued        2 g 200 mL/hr over 30 Minutes Intravenous Every 24 hours 01/29/23 1548 01/30/23 1413   01/29/23 1549  vancomycin variable dose per unstable renal function (pharmacist dosing)  Status:  Discontinued         Does not apply See admin instructions 01/29/23 1549 02/03/23 0953   01/29/23 0600  ceFEPIme (MAXIPIME) 1 g in sodium chloride 0.9 % 100 mL IVPB  Status:  Discontinued        1 g 200 mL/hr over 30 Minutes Intravenous Every 24 hours 01/28/23 1006 01/29/23 1548   01/28/23 1600  ceFEPIme (MAXIPIME) 1 g in sodium chloride 0.9 % 100 mL IVPB  Status:  Discontinued        1 g 200 mL/hr over 30 Minutes Intravenous Every 24 hours 01/27/23 1715 01/27/23 1759   01/28/23 0400  ceFEPIme (MAXIPIME) 2 g in sodium chloride 0.9 % 100 mL IVPB  Status:  Discontinued        2 g 200 mL/hr over 30 Minutes Intravenous Every 12 hours 01/27/23 1759 01/28/23 1006   01/27/23 1811   vancomycin variable dose per unstable renal function (pharmacist dosing)  Status:  Discontinued         Does not apply See admin instructions 01/27/23 1811 01/28/23 0959   01/27/23 1445  metroNIDAZOLE (FLAGYL) IVPB 500 mg  Status:  Discontinued        500 mg 100 mL/hr over 60 Minutes Intravenous Every 12 hours 01/27/23 1434 01/28/23 1010   01/27/23 1445  ceFEPIme (MAXIPIME) 1 g in sodium chloride 0.9 % 100 mL IVPB        1 g 200 mL/hr over 30 Minutes Intravenous  Once 01/27/23 1440 01/27/23 1618   01/27/23 1115  cefTRIAXone (ROCEPHIN) 2 g in sodium chloride 0.9 % 100 mL IVPB        2 g 200 mL/hr over 30 Minutes Intravenous  Once 01/27/23 1101 01/27/23 1212   01/27/23 1115  vancomycin (VANCOCIN) 2,500 mg in sodium chloride 0.9 % 500 mL IVPB        2,500 mg  262.5 mL/hr over 120 Minutes Intravenous  Once 01/27/23 1108 01/27/23 1534       Subjective: Patient seen and examined at bedside today.  Hemodynamically stable.  Like yesterday, hismood looks low.  Does not want to participate in conversation.  No new complaints.  Not in any distress.  He was being transported to dialysis  Objective: Vitals:   02/03/23 1030 02/03/23 1100 02/03/23 1130 02/03/23 1140  BP: 95/74 117/76 126/71 126/72  Pulse: 92 86 81 89  Resp: 18 (!) 23 (!) 23 15  Temp:      TempSrc:      SpO2: 99% 93% 96% 100%  Weight:      Height:        Intake/Output Summary (Last 24 hours) at 02/03/2023 1151 Last data filed at 02/03/2023 0856 Gross per 24 hour  Intake 941.47 ml  Output 3000 ml  Net -2058.53 ml   Filed Weights   01/31/23 0715 02/02/23 0515 02/03/23 0703  Weight: (!) 143.2 kg (!) 144.3 kg (!) 148.1 kg    Examination:  General exam: Overall comfortable, not in distress, morbidly obese HEENT: PERRL Respiratory system:  no wheezes or crackles  Cardiovascular system: S1 & S2 heard, RRR.  Gastrointestinal system: Abdomen is nondistended, soft and nontender. Central nervous system: Alert and  oriented Extremities: Femoral dialysis catheter, right foot dressing with wound VAC Skin: No rashes, no ulcers,no icterus     Data Reviewed: I have personally reviewed following labs and imaging studies  CBC: Recent Labs  Lab 01/29/23 0833 01/30/23 0505 01/31/23 0818 02/01/23 0949 02/02/23 0810  WBC 9.4 11.1* 8.9 8.4 7.7  HGB 8.0* 8.2* 7.9* 8.3* 8.2*  HCT 24.9* 25.5* 24.4* 25.5* 24.9*  MCV 86.2 84.7 84.4 86.1 85.6  PLT 271 303 306 316 328   Basic Metabolic Panel: Recent Labs  Lab 01/28/23 0630 01/30/23 0505 01/30/23 0722 01/31/23 0818 02/01/23 0949 02/02/23 0810 02/03/23 0953  NA 130*   < > 131* 130* 131* 132* 133*  K 5.9*   < > 5.5* 5.2* 5.2* 5.8* 4.5  CL 94*   < > 96* 95* 93* 96* 95*  CO2 16*   < > 15* 14* 14* 14* 23  GLUCOSE 103*   < > 85 107* 80 74 103*  BUN 99*   < > 107* 111* 116* 123* 68*  CREATININE 19.76*   < > 20.75* 22.18* 22.65* 24.66* 16.15*  CALCIUM 8.6*   < > 8.3* 8.3* 8.5* 8.6* 8.2*  MG 2.1  --   --   --   --   --   --   PHOS  --   --  6.3*  --  9.2* 10.6* 8.4*   < > = values in this interval not displayed.     Recent Results (from the past 240 hour(s))  Culture, blood (Routine x 2)     Status: Abnormal   Collection Time: 01/27/23 11:00 AM   Specimen: BLOOD  Result Value Ref Range Status   Specimen Description BLOOD RIGHT ANTECUBITAL  Final   Special Requests   Final    BOTTLES DRAWN AEROBIC AND ANAEROBIC Blood Culture adequate volume   Culture  Setup Time   Final    GRAM NEGATIVE RODS IN BOTH AEROBIC AND ANAEROBIC BOTTLES CRITICAL RESULT CALLED TO, READ BACK BY AND VERIFIED WITH: V BRYK,[id number]  01/28/23 MK Performed at [facility], [address] [place], [place] [address]    Culture PROTEUS MIRABILIS (A)  Final  Report Status 01/30/2023 FINAL  Final   Organism ID, Bacteria PROTEUS MIRABILIS  Final      Susceptibility   Proteus mirabilis - MIC*    AMPICILLIN <=2 SENSITIVE Sensitive     CEFEPIME <=0.12 SENSITIVE Sensitive      CEFTAZIDIME <=1 SENSITIVE Sensitive     CEFTRIAXONE <=0.25 SENSITIVE Sensitive     CIPROFLOXACIN <=0.25 SENSITIVE Sensitive     GENTAMICIN <=1 SENSITIVE Sensitive     IMIPENEM 2 SENSITIVE Sensitive     TRIMETH/SULFA <=20 SENSITIVE Sensitive     AMPICILLIN/SULBACTAM <=2 SENSITIVE Sensitive     PIP/TAZO <=4 SENSITIVE Sensitive     * PROTEUS MIRABILIS  Blood Culture ID Panel (Reflexed)     Status: Abnormal   Collection Time: 01/27/23 11:00 AM  Result Value Ref Range Status   Enterococcus faecalis NOT DETECTED NOT DETECTED Final   Enterococcus Faecium NOT DETECTED NOT DETECTED Final   Listeria monocytogenes NOT DETECTED NOT DETECTED Final   Staphylococcus species NOT DETECTED NOT DETECTED Final   Staphylococcus aureus (BCID) NOT DETECTED NOT DETECTED Final   Staphylococcus epidermidis NOT DETECTED NOT DETECTED Final   Staphylococcus lugdunensis NOT DETECTED NOT DETECTED Final   Streptococcus species NOT DETECTED NOT DETECTED Final   Streptococcus agalactiae NOT DETECTED NOT DETECTED Final   Streptococcus pneumoniae NOT DETECTED NOT DETECTED Final   Streptococcus pyogenes NOT DETECTED NOT DETECTED Final   A.calcoaceticus-baumannii NOT DETECTED NOT DETECTED Final   Bacteroides fragilis NOT DETECTED NOT DETECTED Final   Enterobacterales DETECTED (A) NOT DETECTED Final    Comment: Enterobacterales represent a large order of gram negative bacteria, not a single organism. CRITICAL RESULT CALLED TO, READ BACK BY AND VERIFIED WITH: V BRYK,[id number]  01/28/23 MK    Enterobacter cloacae complex NOT DETECTED NOT DETECTED Final   Escherichia coli NOT DETECTED NOT DETECTED Final   Klebsiella aerogenes NOT DETECTED NOT DETECTED Final   Klebsiella oxytoca NOT DETECTED NOT DETECTED Final   Klebsiella pneumoniae NOT DETECTED NOT DETECTED Final   Proteus species DETECTED (A) NOT DETECTED Final    Comment: CRITICAL RESULT CALLED TO, READ BACK BY AND VERIFIED WITH: V BRYK,[id number]  01/28/23 MK     Salmonella species NOT DETECTED NOT DETECTED Final   Serratia marcescens NOT DETECTED NOT DETECTED Final   Haemophilus influenzae NOT DETECTED NOT DETECTED Final   Neisseria meningitidis NOT DETECTED NOT DETECTED Final   Pseudomonas aeruginosa NOT DETECTED NOT DETECTED Final   Stenotrophomonas maltophilia NOT DETECTED NOT DETECTED Final   Candida albicans NOT DETECTED NOT DETECTED Final   Candida auris NOT DETECTED NOT DETECTED Final   Candida glabrata NOT DETECTED NOT DETECTED Final   Candida krusei NOT DETECTED NOT DETECTED Final   Candida parapsilosis NOT DETECTED NOT DETECTED Final   Candida tropicalis NOT DETECTED NOT DETECTED Final   Cryptococcus neoformans/gattii NOT DETECTED NOT DETECTED Final   CTX-M ESBL NOT DETECTED NOT DETECTED Final   Carbapenem resistance IMP NOT DETECTED NOT DETECTED Final   Carbapenem resistance KPC NOT DETECTED NOT DETECTED Final   Carbapenem resistance NDM NOT DETECTED NOT DETECTED Final   Carbapenem resist OXA 48 LIKE NOT DETECTED NOT DETECTED Final   Carbapenem resistance VIM NOT DETECTED NOT DETECTED Final    Comment: Performed at [facility], [address] [place], [place] [address]  SARS Coronavirus 2 by RT PCR (hospital order, performed in [facility] hospital lab) *cepheid single result test* Anterior Nasal Swab     Status: None   Collection Time: 01/27/23 11:42  AM   Specimen: Anterior Nasal Swab  Result Value Ref Range Status   SARS Coronavirus 2 by RT PCR NEGATIVE NEGATIVE Final    Comment: Performed at [facility], [address], [place], [place] [address]  Culture, blood (Routine x 2)     Status: None   Collection Time: 01/27/23 11:50 AM   Specimen: BLOOD RIGHT HAND  Result Value Ref Range Status   Specimen Description BLOOD RIGHT HAND  Final   Special Requests   Final    BOTTLES DRAWN AEROBIC AND ANAEROBIC Blood Culture adequate volume   Culture   Final    NO GROWTH 5 DAYS Performed at [facility], [address], [place], [place] [address]    Report Status 02/01/2023 FINAL  Final  MRSA Next Gen by PCR, Nasal     Status: None   Collection Time: 01/27/23  3:09 PM   Specimen: Nasal Mucosa; Nasal Swab  Result Value Ref Range Status   MRSA by PCR Next Gen NOT DETECTED NOT DETECTED Final    Comment: (NOTE) The GeneXpert MRSA Assay (FDA approved for NASAL specimens only), is one component of a comprehensive MRSA colonization surveillance program. It is not intended to diagnose MRSA infection nor to guide or monitor treatment for MRSA infections. Test performance is not FDA approved in patients less than 24 years old. Performed at [facility], [address], [place], [place] [address]   Culture, blood (Routine X 2) w Reflex to ID Panel     Status: None (Preliminary result)   Collection Time: 01/29/23  8:33 AM   Specimen: BLOOD LEFT ARM  Result Value Ref Range Status   Specimen Description BLOOD LEFT ARM  Final   Special Requests   Final    BOTTLES DRAWN AEROBIC ONLY Blood Culture adequate volume   Culture   Final    NO GROWTH 4 DAYS Performed at [facility], [address], [place], [place] [address]    Report Status PENDING  Incomplete  Culture, blood (Routine X 2) w Reflex to ID Panel     Status: None (Preliminary result)   Collection Time: 01/29/23  8:35 AM   Specimen: BLOOD RIGHT HAND  Result Value Ref Range Status   Specimen Description BLOOD RIGHT HAND  Final   Special Requests   Final    BOTTLES DRAWN AEROBIC ONLY Blood Culture adequate volume   Culture   Final    NO GROWTH 4 DAYS Performed at [facility], [address] [place], [place] [address]    Report Status PENDING  Incomplete  Aerobic/Anaerobic Culture w Gram Stain (surgical/deep wound)     Status: None (Preliminary result)   Collection Time: 02/01/23  3:07 PM   Specimen: Foot, Right; Tissue  Result Value Ref Range Status   Specimen Description TISSUE  Final   Special Requests right metatarsal  bone  Final   Gram Stain RARE GRAM POSITIVE COCCI NO WBC SEEN   Final   Culture   Final    CULTURE REINCUBATED FOR BETTER GROWTH Performed at [facility], [address], [place], [place] [address]    Report Status PENDING  Incomplete  Aerobic/Anaerobic Culture w Gram Stain (surgical/deep wound)     Status: None (Preliminary result)   Collection Time: 02/01/23  3:08 PM   Specimen: Path fluid; Amputation  Result Value Ref Range Status   Specimen Description WOUND  Final   Special Requests  right foot  Final   Gram Stain   Final    RARE WBC PRESENT, PREDOMINANTLY PMN NO ORGANISMS SEEN    Culture   Final    CULTURE REINCUBATED FOR BETTER GROWTH Performed at [facility], [address], [place], [place] [address]    Report Status PENDING  Incomplete     Radiology Studies: IR Venocavagram Ivc  Result Date: 02/02/2023 INDICATION: 47 year old male with chronic renal failure, has had multiple prior surgical dialysis circuit performed, multiple prior catheter. 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 The current left femoral tunneled hemodialysis catheter is nonfunctioning, position within collateral venous drainage of the left pelvis. He presents today for possible venous reconstruction and tunneled catheter exchange, versus possible transhepatic dialysis catheter placement. EXAM: IMAGE GUIDED EXCHANGE OF TUNNELED HEMODIALYSIS CATHETER LEFT LOWER EXTREMITY VENOGRAM BALLOON ANGIOPLASTY AND STENT RECONSTRUCTION OF LEFT COMMON ILIAC VEIN OCCLUSION MEDICATIONS: 2 g Ancef. The antibiotic was given in an appropriate time interval prior to skin puncture. 6000 units IV heparin ANESTHESIA/SEDATION: Moderate (conscious) sedation was employed during this procedure. A total of Versed 2.0 mg and Fentanyl 200 mcg was administered intravenously by the radiology nurse. Total intra-service moderate  Sedation Time: 43 minutes. The patient's level of consciousness and vital signs were monitored continuously by radiology nursing throughout the procedure under my direct supervision. FLUOROSCOPY: Radiation Exposure Index (as provided by the fluoroscopic device): 908 mGy Kerma COMPLICATIONS: None PROCEDURE: Informed written consent was obtained from the patient after a discussion of the risks, benefits, and alternatives to treatment. Questions regarding the procedure were encouraged and answered. The left inguinal region including the indwelling catheter were prepped with chlorhexidine in a sterile fashion, and a sterile drape was applied covering the operative field. Maximum barrier sterile technique with sterile gowns and gloves were used for the procedure. A timeout was performed prior to the initiation of the procedure. Spot images of the pelvis performed. 1% lidocaine was used for local anesthesia. Bentson wire was advanced through the nonfunctioning tunneled hemodialysis catheter. A standard 9 French sheath was placed on the wire. The sheath was not long enough to enter the vein for a durable purchase. A 10 French tips sheath was then placed on the Bentson wire into the iliac venous system. Dilator was removed and venogram performed. A combination of a never cross crossing catheter and a stiff Glidewire were used to cross the left common iliac vein occlusion. Once the catheter was in the IVC the wire was removed. Venogram was performed. Bentson wire was then passed into the IVC. Venogram at the sheath was performed. Balloon angioplasty was then performed of the occluded iliac vein, 12 mm x 40 mm. The sheath was then advanced into the lower IVC for repeat venogram. Sheath was withdrawn. We then placed the crossing catheter coaxial alongside the Bentson wire, for a somewhat tenia sh injection into the right common iliac vein to establish the right common iliac inflow. Catheter was removed and then a final balloon  angioplasty was then performed of infrarenal IVC stenosis, 12 mm x 40 mm. We elected then 2 stent the left common iliac vein, knowing that the durability of the occluded venous segment would be poor without stenting. A 14 mm x 60 mm dedicated operate venous stent was deployed. 14 mm balloon angioplasty was then performed for post dilation. We then elected to place a 28 cm tip to cuff tunneled  hemodialysis catheter, which was placed on the Bentson wire. The wire and the introducers were removed. The catheter aspirates well at the conclusion. Final images were stored. The catheter aspirates and flushes normally. The catheter was flushed with appropriate volume heparin dwells. The catheter exit site was secured with a 0-Prolene retention suture. Dressings were applied. The patient tolerated the procedure well without immediate post procedural complication. FINDINGS: Current catheter is nonfunctional, with inability to aspirate the blue and red hubs. Venogram of the left lower extremity demonstrates patent confluence of the left internal iliac vein and external iliac vein, with occlusion of the common iliac vein. Suprarenal IVC occlusion. Flow is reversed from the IVC through the renal veins. The renal veins drain the infrarenal IVC flow via collateral network. 50% stenosis of the infrarenal IVC secondary to chronic venous disease. Balloon angioplasty and stenting of left common iliac vein, restored patency after placement of 14 mm diameter dedicated venous stent. Operational tunneled catheter at the conclusion. We elected to place a 28 cm tip to cuff catheter. A 33 cm tip to cath catheter would also be reasonable. IMPRESSION: Status post left lower extremity venogram, angioplasty and stenting of iliac venous occlusion, angioplasty of infrarenal IVC stenosis, and replacement of left femoral vein tunneled hemodialysis catheter with a new 28 cm tip to cuff catheter. Signed, Yvone Neu. Miachel Roux, RPVI [facility] Radiology Electronically Signed   By: Gilmer Mor D.O.   On: 02/02/2023 13:03   IR Fluoro Guide CV Line Left  Result Date: 02/02/2023 INDICATION: 47 year old male with chronic renal failure, has had multiple prior surgical dialysis circuit performed, multiple prior catheter. 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 The current left femoral tunneled hemodialysis catheter is nonfunctioning, position within collateral venous drainage of the left pelvis. He presents today for possible venous reconstruction and tunneled catheter exchange, versus possible transhepatic dialysis catheter placement. EXAM: IMAGE GUIDED EXCHANGE OF TUNNELED HEMODIALYSIS CATHETER LEFT LOWER EXTREMITY VENOGRAM BALLOON ANGIOPLASTY AND STENT RECONSTRUCTION OF LEFT COMMON ILIAC VEIN OCCLUSION MEDICATIONS: 2 g Ancef. The antibiotic was given in an appropriate time interval prior to skin puncture. 6000 units IV heparin ANESTHESIA/SEDATION: Moderate (conscious) sedation was employed during this procedure. A total of Versed 2.0 mg and Fentanyl 200 mcg was administered intravenously by the radiology nurse. Total intra-service moderate Sedation Time: 43 minutes. The patient's level of consciousness and vital signs were monitored continuously by radiology nursing throughout the procedure under my direct supervision. FLUOROSCOPY: Radiation Exposure Index (as provided by the fluoroscopic device): 908 mGy Kerma COMPLICATIONS: None PROCEDURE: Informed written consent was obtained from the patient after a discussion of the risks, benefits, and alternatives to treatment. Questions regarding the procedure were encouraged and answered. The left inguinal region including the indwelling catheter were prepped with chlorhexidine in a sterile fashion, and a sterile drape was applied covering the operative field.  Maximum barrier sterile technique with sterile gowns and gloves were used for the procedure. A timeout was performed prior to the initiation of the procedure. Spot images of the pelvis performed. 1% lidocaine was used for local anesthesia. Bentson wire was advanced through the nonfunctioning tunneled hemodialysis catheter. A standard 9 French sheath was placed on the wire. The sheath was not long enough to enter the vein for a durable purchase. A 10 French tips sheath was then placed on the Bentson wire into the iliac venous  system. Dilator was removed and venogram performed. A combination of a never cross crossing catheter and a stiff Glidewire were used to cross the left common iliac vein occlusion. Once the catheter was in the IVC the wire was removed. Venogram was performed. Bentson wire was then passed into the IVC. Venogram at the sheath was performed. Balloon angioplasty was then performed of the occluded iliac vein, 12 mm x 40 mm. The sheath was then advanced into the lower IVC for repeat venogram. Sheath was withdrawn. We then placed the crossing catheter coaxial alongside the Bentson wire, for a somewhat tenia sh injection into the right common iliac vein to establish the right common iliac inflow. Catheter was removed and then a final balloon angioplasty was then performed of infrarenal IVC stenosis, 12 mm x 40 mm. We elected then 2 stent the left common iliac vein, knowing that the durability of the occluded venous segment would be poor without stenting. A 14 mm x 60 mm dedicated operate venous stent was deployed. 14 mm balloon angioplasty was then performed for post dilation. We then elected to place a 28 cm tip to cuff tunneled hemodialysis catheter, which was placed on the Bentson wire. The wire and the introducers were removed. The catheter aspirates well at the conclusion. Final images were stored. The catheter aspirates and flushes normally. The catheter was flushed with appropriate volume heparin  dwells. The catheter exit site was secured with a 0-Prolene retention suture. Dressings were applied. The patient tolerated the procedure well without immediate post procedural complication. FINDINGS: Current catheter is nonfunctional, with inability to aspirate the blue and red hubs. Venogram of the left lower extremity demonstrates patent confluence of the left internal iliac vein and external iliac vein, with occlusion of the common iliac vein. Suprarenal IVC occlusion. Flow is reversed from the IVC through the renal veins. The renal veins drain the infrarenal IVC flow via collateral network. 50% stenosis of the infrarenal IVC secondary to chronic venous disease. Balloon angioplasty and stenting of left common iliac vein, restored patency after placement of 14 mm diameter dedicated venous stent. Operational tunneled catheter at the conclusion. We elected to place a 28 cm tip to cuff catheter. A 33 cm tip to cath catheter would also be reasonable. IMPRESSION: Status post left lower extremity venogram, angioplasty and stenting of iliac venous occlusion, angioplasty of infrarenal IVC stenosis, and replacement of left femoral vein tunneled hemodialysis catheter with a new 28 cm tip to cuff catheter. Signed, Yvone Neu. Miachel Roux, RPVI [facility] Radiology Electronically Signed   By: Gilmer Mor D.O.   On: 02/02/2023 13:03   IR PTS AND STENT ADDL CENTRAL DIALY SEG THRU DIALY CIRCUIT LEFT  Result Date: 02/02/2023 INDICATION: 47 year old male with chronic renal failure, has had multiple prior surgical dialysis circuit performed, multiple prior catheter. 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 The current left femoral tunneled hemodialysis catheter is nonfunctioning, position within collateral venous drainage of the left pelvis. He presents today for possible  venous reconstruction and tunneled catheter exchange, versus possible transhepatic dialysis catheter placement. EXAM: IMAGE GUIDED EXCHANGE OF TUNNELED HEMODIALYSIS CATHETER LEFT LOWER EXTREMITY VENOGRAM BALLOON ANGIOPLASTY AND STENT RECONSTRUCTION OF LEFT COMMON ILIAC VEIN OCCLUSION MEDICATIONS: 2 g Ancef. The antibiotic was given in an appropriate time interval prior to skin puncture. 6000 units IV heparin ANESTHESIA/SEDATION: Moderate (conscious) sedation was employed during this procedure.  A total of Versed 2.0 mg and Fentanyl 200 mcg was administered intravenously by the radiology nurse. Total intra-service moderate Sedation Time: 43 minutes. The patient's level of consciousness and vital signs were monitored continuously by radiology nursing throughout the procedure under my direct supervision. FLUOROSCOPY: Radiation Exposure Index (as provided by the fluoroscopic device): 908 mGy Kerma COMPLICATIONS: None PROCEDURE: Informed written consent was obtained from the patient after a discussion of the risks, benefits, and alternatives to treatment. Questions regarding the procedure were encouraged and answered. The left inguinal region including the indwelling catheter were prepped with chlorhexidine in a sterile fashion, and a sterile drape was applied covering the operative field. Maximum barrier sterile technique with sterile gowns and gloves were used for the procedure. A timeout was performed prior to the initiation of the procedure. Spot images of the pelvis performed. 1% lidocaine was used for local anesthesia. Bentson wire was advanced through the nonfunctioning tunneled hemodialysis catheter. A standard 9 French sheath was placed on the wire. The sheath was not long enough to enter the vein for a durable purchase. A 10 French tips sheath was then placed on the Bentson wire into the iliac venous system. Dilator was removed and venogram performed. A combination of a never cross crossing catheter and a stiff  Glidewire were used to cross the left common iliac vein occlusion. Once the catheter was in the IVC the wire was removed. Venogram was performed. Bentson wire was then passed into the IVC. Venogram at the sheath was performed. Balloon angioplasty was then performed of the occluded iliac vein, 12 mm x 40 mm. The sheath was then advanced into the lower IVC for repeat venogram. Sheath was withdrawn. We then placed the crossing catheter coaxial alongside the Bentson wire, for a somewhat tenia sh injection into the right common iliac vein to establish the right common iliac inflow. Catheter was removed and then a final balloon angioplasty was then performed of infrarenal IVC stenosis, 12 mm x 40 mm. We elected then 2 stent the left common iliac vein, knowing that the durability of the occluded venous segment would be poor without stenting. A 14 mm x 60 mm dedicated operate venous stent was deployed. 14 mm balloon angioplasty was then performed for post dilation. We then elected to place a 28 cm tip to cuff tunneled hemodialysis catheter, which was placed on the Bentson wire. The wire and the introducers were removed. The catheter aspirates well at the conclusion. Final images were stored. The catheter aspirates and flushes normally. The catheter was flushed with appropriate volume heparin dwells. The catheter exit site was secured with a 0-Prolene retention suture. Dressings were applied. The patient tolerated the procedure well without immediate post procedural complication. FINDINGS: Current catheter is nonfunctional, with inability to aspirate the blue and red hubs. Venogram of the left lower extremity demonstrates patent confluence of the left internal iliac vein and external iliac vein, with occlusion of the common iliac vein. Suprarenal IVC occlusion. Flow is reversed from the IVC through the renal veins. The renal veins drain the infrarenal IVC flow via collateral network. 50% stenosis of the infrarenal IVC  secondary to chronic venous disease. Balloon angioplasty and stenting of left common iliac vein, restored patency after placement of 14 mm diameter dedicated venous stent. Operational tunneled catheter at the conclusion. We elected to place a 28 cm tip to cuff catheter. A 33 cm tip to cath catheter would also be reasonable. IMPRESSION: Status post left lower extremity venogram, angioplasty and stenting  of iliac venous occlusion, angioplasty of infrarenal IVC stenosis, and replacement of left femoral vein tunneled hemodialysis catheter with a new 28 cm tip to cuff catheter. Signed, Yvone Neu. Miachel Roux, RPVI [facility] Radiology Electronically Signed   By: Gilmer Mor D.O.   On: 02/02/2023 13:03   PERIPHERAL VASCULAR CATHETERIZATION  Result Date: 02/01/2023 See surgical note for result.   Scheduled Meds:  (feeding supplement) PROSource Plus  30 mL Oral BID BM   Chlorhexidine Gluconate Cloth  6 each Topical Daily   Chlorhexidine Gluconate Cloth  6 each Topical Q0600   Chlorhexidine Gluconate Cloth  6 each Topical Q0600   darbepoetin (ARANESP) injection - DIALYSIS  150 mcg Subcutaneous Q Thu-1800   ferric citrate  630 mg Oral TID WC   metroNIDAZOLE  500 mg Oral Q12H   midodrine  15 mg Oral TID WC   sertraline  25 mg Oral Daily   Continuous Infusions:  sodium chloride Stopped (01/30/23 2358)    ceFAZolin (ANCEF) IV 1 g (02/02/23 2127)   heparin 2,600 Units/hr (02/03/23 0859)   vancomycin       LOS: 7 days   Burnadette Pop, MD [facility] P8/27/2024, 11:51 AM

## 2023-02-03 NOTE — Progress Notes (Incomplete)
[facility] Infectious Diseases Follow Up Note  Patient Identification: Patient Name: [name] MRN: [id number] Admit Date: 01/27/2023 10:17 AM Age: 47 y.o.Today's Date: 02/03/2023  Reason for Visit: DFU  Active Problems:   Paroxysmal atrial fibrillation (HCC)   Septic shock (HCC)   Chronic anticoagulation   Gram-negative bacteremia   ESRD on hemodialysis (HCC)   Non-healing wound of lower extremity   Chronic osteomyelitis of right foot with draining sinus (HCC)   Current Antibiotics: Cefepime 8/20-8/21, ceftriaxone 8/22, Cefazolin 8/23-c Metronidazole 8/20-c Vancomycin 8/20-c  Lines/Hardwares: left fem tunneled HD catheter  Interval Events:   Assessment 47 year old male with PMH as below including 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 Fem line exchanged 8/22 - non - functioning> 8/26 LLE venogram, angioplasty and stenting of iliac occlusion, anmgioplasty of infrarenal stenosis,  and replacement of Left femoral vein tunneled HD catheter  # Open wound at first amputation site-s/p completion of vancomycin and metronidazole on 01/09/2023. S/p 8/25 I&D of right foot as well as debridement, insertion of absorbable antibiotic plates and application of skin substitute and negative pressure wound therapy. OR cx with [facility]   # ESRD on HD - plan for AVF with HERO graft on 9/9 at [facility]   Recommendations Continue Vancomycin and cefazolin with HD as well as PO metronidazole for 6 weeks from 8/25 Monitor CBC, BMP and Vancomycin trough    OPAT  Diagnosis: osteomyelitis rt foot   Culture Result: MRSA, bacteroides and Proteus mirabilis   No Known Allergies  OPAT Orders Discharge antibiotics to be given via PICC line Discharge  antibiotics: Vancomycin and cefazolin with HD and PO metronidazole 500mg  po bid  Per pharmacy protocol  Aim for Vancomycin trough 15-20 or AUC 400-550 (unless otherwise indicated) Duration: 6 weeks End Date: 03/15/23  [facility] Care Per Protocol:  Home health RN for IV administration and teaching; PICC line care and labs.    Labs weekly while on IV antibiotics: X__ CBC with differential X__ BMP __ CMP X__ CRP X__ ESR X__ Vancomycin trough twice a week  __ CK  X__ Please pull PIC at completion of IV antibiotics __ Please leave PIC in place until doctor has seen patient or been notified  Fax weekly labs to [phone]  Clinic Follow Up Appt:    Rest of the management as per the primary team. Thank you for the consult. Please page with pertinent questions or concerns.  ______________________________________________________________________ Subjective patient seen and examined at the bedside. No complaints except some loose stools, not bothersome, Denies fevers, abdominal cramps   Past Medical History:  Diagnosis Date   Acute respiratory failure with hypoxia (HCC) 11/30/2018   Anemia    ESRD   ESRD on hemodialysis (HCC) 06/07/2011   TTS [place] [facility]. Started HD in 2006, got transplant in 2014 lasted until Dec 2019 then went back on HD.  Has L thigh AVG as of Jun 2020.  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    History of blood transfusion    Hypertension    03/19/22- has not had high blood pressure in 5 years.   Morbid obesity (HCC)    Paroxysmal atrial fibrillation (HCC)    Pre-diabetes    no meds   Sepsis (HCC) 11/30/2018   Sleep apnea    lost weight, does not use  CPAP   Past Surgical History:  Procedure Laterality Date   ALLOGRAFT APPLICATION Right 02/01/2023   Procedure: ALLOGRAFT [facility];  Surgeon: Edwin Cap, DPM;  Location: [facility] OR;  Service: Orthopedics/Podiatry;  Laterality: Right;   AMPUTATION Right 12/12/2022   Procedure: AMPUTATION  RAY;  Surgeon: Louann Sjogren, DPM;  Location: [facility] OR;  Service: Orthopedics/Podiatry;  Laterality: Right;  surgical team to do local block   APPLICATION OF WOUND VAC Right 02/01/2023   Procedure: APPLICATION OF WOUND VAC;  Surgeon: Edwin Cap, DPM;  Location: [facility] OR;  Service: Orthopedics/Podiatry;  Laterality: Right;   ARTERIOVENOUS GRAFT PLACEMENT  08/09/2010   Left Thigh Graft by Dr. Cari Caraway   AV FISTULA PLACEMENT Right 05/18/2018   Procedure: INSERTION OF ARTERIOVENOUS (AV) GORE-TEX GRAFT Left THIGH;  Surgeon: Maeola Harman, MD;  Location: [facility] OR;  Service: Vascular;  Laterality: Right;   AV FISTULA PLACEMENT Right 02/15/2021   Procedure: INSERTION OF ARTERIOVENOUS (AV) GORE-TEX LOOP GRAFT RIGHT THIGH;  Surgeon: Maeola Harman, MD;  Location: [facility] OR;  Service: Vascular;  Laterality: Right;   BIOPSY  05/20/2022   Procedure: BIOPSY;  Surgeon: Shellia Cleverly, DO;  [facility];  Service: Gastroenterology;;   CAPD INSERTION N/A 06/13/2022   Procedure: LAPAROSCOPIC INSERTION CONTINUOUS AMBULATORY PERITONEAL DIALYSIS  (CAPD) CATHETER;  Surgeon: Maeola Harman, MD;  Location: [facility] OR;  Service: Vascular;  Laterality: N/A;   CAPD REMOVAL  05/08/2011   Procedure: CONTINUOUS AMBULATORY PERITONEAL DIALYSIS  (CAPD) CATHETER REMOVAL;  Surgeon: Iona Coach, MD;  Location: [facility] OR;  Service: General;  Laterality: N/A;  Removal of CAPD catheter, Dr. requests to go after 100   CAPD REMOVAL N/A 08/08/2022   Procedure: REMOVAL CONTINUOUS AMBULATORY PERITONEAL DIALYSIS  (CAPD) CATHETER;  Surgeon: Maeola Harman, MD;  Location: [facility] OR;  Service: Vascular;  Laterality: N/A;   COLONOSCOPY WITH PROPOFOL N/A 05/20/2022   Procedure: COLONOSCOPY WITH PROPOFOL;  Surgeon: Shellia Cleverly, DO;  [facility]: [facility];  Service: Gastroenterology;  Laterality: N/A;   I & D EXTREMITY Right 12/08/2022   Procedure: IRRIGATION AND DEBRIDEMENT RIGHT FOOT;   Surgeon: Louann Sjogren, DPM;  Location: [facility] OR;  Service: Orthopedics/Podiatry;  Laterality: Right;   I & D EXTREMITY Right 02/01/2023   Procedure: IRRIGATION AND DEBRIDEMENT RIGHT FOOT;  Surgeon: Edwin Cap, DPM;  Location: [facility] OR;  Service: Orthopedics/Podiatry;  Laterality: Right;   INSERTION OF DIALYSIS CATHETER  10/05/2010   Right Femoral Cath insertion by Dr. Leonides Sake.  Pt ahas had several caths inserted.   INSERTION OF DIALYSIS CATHETER Right 02/15/2021   Procedure: Attempted INSERTION OF RIGHT and Left Internal Jugular DIALYSIS CATHETER, Insertion of Left Femoral Vein Dialysis Catheter;  Surgeon: Maeola Harman, MD;  Location: [facility] OR;  Service: Vascular;  Laterality: Right;   INSERTION OF DIALYSIS CATHETER Right 04/26/2021   Procedure: INSERTION OF TUNNELED 55cm PALIDROME PRECISION CHRONIC DIALYSIS CATHETER;  Surgeon: Leonie Douglas, MD;  Location: [facility] OR;  Service: Vascular;  Laterality: Right;   INSERTION OF DIALYSIS CATHETER Left 12/26/2021   Procedure: INSERTION OF TUNNELED  DIALYSIS CATHETER LEFT FEMORAL ARTERY;  Surgeon: Maeola Harman, MD;  Location: [facility] OR;  Service: Vascular;  Laterality: Left;   INSERTION OF DIALYSIS CATHETER Left 03/06/2022   Procedure: INSERTION OF DIALYSIS CATHETER;  Surgeon: Maeola Harman, MD;  Location: [facility] OR;  Service: Vascular;  Laterality: Left;   INSERTION OF DIALYSIS CATHETER Left 12/31/2022   Procedure: INSERTION OF DIALYSIS CATHETER LEFT  GROIN;  Surgeon: Leonie Douglas, MD;  Location: [facility] OR;  Service: Vascular;  Laterality: Left;   IR FLUORO GUIDE CV LINE LEFT  04/23/2022   IR FLUORO GUIDE CV LINE LEFT  01/29/2023   IR FLUORO GUIDE CV LINE LEFT  02/02/2023   IR FLUORO GUIDE CV LINE RIGHT  05/11/2018   IR FLUORO GUIDE CV LINE RIGHT  07/03/2021   IR FLUORO GUIDE CV LINE RIGHT  07/16/2021   IR PTA ADDL CENTRAL DIALYSIS SEG THRU DIALY CIRCUIT RIGHT Right 07/03/2021   IR PTA AND STENT ADDL CENTRAL DIALY SEG THRU DIALY  CIRCUIT LEFT Left 02/02/2023   IR US GUIDE VASC ACCESS LEFT  04/23/2022   IR US GUIDE VASC ACCESS RIGHT  05/11/2018   IR VENO/EXT/UNI LEFT  04/23/2022   IR VENO/EXT/UNI LEFT  01/29/2023   IR VENOCAVAGRAM IVC  07/16/2021   IR VENOCAVAGRAM IVC  02/02/2023   KIDNEY TRANSPLANT  2014   KNEE ARTHROSCOPY Left    LAPAROSCOPIC LYSIS OF ADHESIONS N/A 06/13/2022   Procedure: LAPAROSCOPIC LYSIS OF ADHESIONS;  Surgeon: Maeola Harman, MD;  Location: [facility] OR;  Service: Vascular;  Laterality: N/A;   POLYPECTOMY  05/20/2022   Procedure: POLYPECTOMY;  Surgeon: Shellia Cleverly, DO;  Location: [facility];  Service: Gastroenterology;;   THROMBECTOMY W/ EMBOLECTOMY Right 04/26/2021   Procedure: REMOVAL OF RIGHT THIGH ARTERIOVENOUS GORE-TEX GRAFT AND REPAIR OF RIGHT COMMON FEMORAL ARTERY;  Surgeon: Leonie Douglas, MD;  Location: [facility] OR;  Service: Vascular;  Laterality: Right;   UPPER EXTREMITY ANGIOGRAPHY Bilateral 05/13/2018   Procedure: UPPER EXTREMITY ANGIOGRAPHY - bilarteral;  Surgeon: Cephus Shelling, MD;  [facility];  Service: Cardiovascular;  Laterality: Bilateral;     Vitals BP (!) 122/90 (BP Location: Right Arm)   Pulse 87   Temp 98.2 F (36.8 C) (Oral)   Resp 17   Ht 6\' 2"  (1.88 m)   Wt (!) 144.3 kg   SpO2 98%   BMI 40.84 kg/m     Physical Exam Constitutional:  adult male lying in the bed, NAD    Comments:   Cardiovascular:     Rate and Rhythm: Normal rate and regular rhythm.     Heart sounds: s1s2  Pulmonary:     Effort: Pulmonary effort is normal on room air     Comments: Normal breath sounds   Abdominal:     Palpations: Abdomen is soft.     Tenderness: non distended and non tender   Musculoskeletal:        General: No swelling or tenderness in peripheral joints. Rt foot is bandaged C/D/I  Skin:    Comments: No rashes   Neurological:     General: awake, alert and oriented, following commands. Left fem HD catheter + with no redness,  tenderness    Psychiatric:        Mood and Affect: Mood normal.   Pertinent Microbiology Results for orders placed or performed during the hospital encounter of 01/27/23  Culture, blood (Routine x 2)     Status: Abnormal   Collection Time: 01/27/23 11:00 AM   Specimen: BLOOD  Result Value Ref Range Status   Specimen Description BLOOD RIGHT ANTECUBITAL  Final   Special Requests   Final    BOTTLES DRAWN AEROBIC AND ANAEROBIC Blood Culture adequate volume   Culture  Setup Time   Final    GRAM NEGATIVE RODS IN BOTH AEROBIC AND ANAEROBIC BOTTLES CRITICAL RESULT CALLED TO, READ BACK BY  AND VERIFIED WITH: V BRYK,PHARMD@[id number]  01/28/23 MK Performed at [facility] Lab, [address] [place], [place] [address]    Culture PROTEUS MIRABILIS (A)  Final   Report Status 01/30/2023 FINAL  Final   Organism ID, Bacteria PROTEUS MIRABILIS  Final      Susceptibility   Proteus mirabilis - MIC*    AMPICILLIN <=2 SENSITIVE Sensitive     CEFEPIME <=0.12 SENSITIVE Sensitive     CEFTAZIDIME <=1 SENSITIVE Sensitive     CEFTRIAXONE <=0.25 SENSITIVE Sensitive     CIPROFLOXACIN <=0.25 SENSITIVE Sensitive     GENTAMICIN <=1 SENSITIVE Sensitive     IMIPENEM 2 SENSITIVE Sensitive     TRIMETH/SULFA <=20 SENSITIVE Sensitive     AMPICILLIN/SULBACTAM <=2 SENSITIVE Sensitive     PIP/TAZO <=4 SENSITIVE Sensitive     * PROTEUS MIRABILIS  Blood Culture ID Panel (Reflexed)     Status: Abnormal   Collection Time: 01/27/23 11:00 AM  Result Value Ref Range Status   Enterococcus faecalis NOT DETECTED NOT DETECTED Final   Enterococcus Faecium NOT DETECTED NOT DETECTED Final   Listeria monocytogenes NOT DETECTED NOT DETECTED Final   Staphylococcus species NOT DETECTED NOT DETECTED Final   Staphylococcus aureus (BCID) NOT DETECTED NOT DETECTED Final   Staphylococcus epidermidis NOT DETECTED NOT DETECTED Final   Staphylococcus lugdunensis NOT DETECTED NOT DETECTED Final   Streptococcus species NOT DETECTED NOT  DETECTED Final   Streptococcus agalactiae NOT DETECTED NOT DETECTED Final   Streptococcus pneumoniae NOT DETECTED NOT DETECTED Final   Streptococcus pyogenes NOT DETECTED NOT DETECTED Final   A.calcoaceticus-baumannii NOT DETECTED NOT DETECTED Final   Bacteroides fragilis NOT DETECTED NOT DETECTED Final   Enterobacterales DETECTED (A) NOT DETECTED Final    Comment: Enterobacterales represent a large order of gram negative bacteria, not a single organism. CRITICAL RESULT CALLED TO, READ BACK BY AND VERIFIED WITH: V BRYK,PHARMD@[id number]  01/28/23 MK    Enterobacter cloacae complex NOT DETECTED NOT DETECTED Final   Escherichia coli NOT DETECTED NOT DETECTED Final   Klebsiella aerogenes NOT DETECTED NOT DETECTED Final   Klebsiella oxytoca NOT DETECTED NOT DETECTED Final   Klebsiella pneumoniae NOT DETECTED NOT DETECTED Final   Proteus species DETECTED (A) NOT DETECTED Final    Comment: CRITICAL RESULT CALLED TO, READ BACK BY AND VERIFIED WITH: V BRYK,PHARMD@[id number]  01/28/23 MK    Salmonella species NOT DETECTED NOT DETECTED Final   Serratia marcescens NOT DETECTED NOT DETECTED Final   Haemophilus influenzae NOT DETECTED NOT DETECTED Final   Neisseria meningitidis NOT DETECTED NOT DETECTED Final   Pseudomonas aeruginosa NOT DETECTED NOT DETECTED Final   Stenotrophomonas maltophilia NOT DETECTED NOT DETECTED Final   Candida albicans NOT DETECTED NOT DETECTED Final   Candida auris NOT DETECTED NOT DETECTED Final   Candida glabrata NOT DETECTED NOT DETECTED Final   Candida krusei NOT DETECTED NOT DETECTED Final   Candida parapsilosis NOT DETECTED NOT DETECTED Final   Candida tropicalis NOT DETECTED NOT DETECTED Final   Cryptococcus neoformans/gattii NOT DETECTED NOT DETECTED Final   CTX-M ESBL NOT DETECTED NOT DETECTED Final   Carbapenem resistance IMP NOT DETECTED NOT DETECTED Final   Carbapenem resistance KPC NOT DETECTED NOT DETECTED Final   Carbapenem resistance NDM NOT DETECTED NOT DETECTED  Final   Carbapenem resist OXA 48 LIKE NOT DETECTED NOT DETECTED Final   Carbapenem resistance VIM NOT DETECTED NOT DETECTED Final    Comment: Performed at [facility] Lab, [address] [place], [place] [address]  SARS Coronavirus  2 by RT PCR (hospital order, performed in [facility] hospital lab) *cepheid single result test* Anterior Nasal Swab     Status: None   Collection Time: 01/27/23 11:42 AM   Specimen: Anterior Nasal Swab  Result Value Ref Range Status   SARS Coronavirus 2 by RT PCR NEGATIVE NEGATIVE Final    Comment: Performed at [facility], [address] [place], [place] [address]  Culture, blood (Routine x 2)     Status: None   Collection Time: 01/27/23 11:50 AM   Specimen: BLOOD RIGHT HAND  Result Value Ref Range Status   Specimen Description BLOOD RIGHT HAND  Final   Special Requests   Final    BOTTLES DRAWN AEROBIC AND ANAEROBIC Blood Culture adequate volume   Culture   Final    NO GROWTH 5 DAYS Performed at [facility], [address] [place], [place] [address]    Report Status 02/01/2023 FINAL  Final  MRSA Next Gen by PCR, Nasal     Status: None   Collection Time: 01/27/23  3:09 PM   Specimen: Nasal Mucosa; Nasal Swab  Result Value Ref Range Status   MRSA by PCR Next Gen NOT DETECTED NOT DETECTED Final    Comment: (NOTE) The GeneXpert MRSA Assay (FDA approved for NASAL specimens only), is one component of a comprehensive MRSA colonization surveillance program. It is not intended to diagnose MRSA infection nor to guide or monitor treatment for MRSA infections. Test performance is not FDA approved in patients less than 38 years old. Performed at [facility], [address], [place], [place] [address]   Culture, blood (Routine X 2) w Reflex to ID Panel     Status: None (Preliminary result)   Collection Time: 01/29/23  8:33 AM   Specimen: BLOOD LEFT ARM  Result Value Ref Range Status   Specimen Description BLOOD LEFT ARM  Final    Special Requests   Final    BOTTLES DRAWN AEROBIC ONLY Blood Culture adequate volume   Culture   Final    NO GROWTH 4 DAYS Performed at [facility] Lab, [address] [place], [place] [address]    Report Status PENDING  Incomplete  Culture, blood (Routine X 2) w Reflex to ID Panel     Status: None (Preliminary result)   Collection Time: 01/29/23  8:35 AM   Specimen: BLOOD RIGHT HAND  Result Value Ref Range Status   Specimen Description BLOOD RIGHT HAND  Final   Special Requests   Final    BOTTLES DRAWN AEROBIC ONLY Blood Culture adequate volume   Culture   Final    NO GROWTH 4 DAYS Performed at [facility], [address] [place], [place] [address]    Report Status PENDING  Incomplete  Aerobic/Anaerobic Culture w Gram Stain (surgical/deep wound)     Status: None (Preliminary result)   Collection Time: 02/01/23  3:07 PM   Specimen: Foot, Right; Tissue  Result Value Ref Range Status   Specimen Description TISSUE  Final   Special Requests right metatarsal bone  Final   Gram Stain RARE GRAM POSITIVE COCCI NO WBC SEEN   Final   Culture   Final    NO GROWTH < 24 HOURS Performed at [facility], [address] [place], [place] [address]    Report Status PENDING  Incomplete  Aerobic/Anaerobic Culture w Gram Stain (surgical/deep wound)     Status: None (Preliminary result)  Collection Time: 02/01/23  3:08 PM   Specimen: Path fluid; Amputation  Result Value Ref Range Status   Specimen Description WOUND  Final   Special Requests right foot  Final   Gram Stain   Final    RARE WBC PRESENT, PREDOMINANTLY PMN NO ORGANISMS SEEN    Culture   Final    NO GROWTH < 24 HOURS Performed at [facility] Lab, [address], [place], [place] [address]    Report Status PENDING  Incomplete    Pertinent Lab.    Latest Ref Rng & Units 02/02/2023    8:10 AM 02/01/2023    9:49 AM 01/31/2023    8:18 AM  CBC  WBC 4.0 - 10.5 K/uL 7.7  8.4  8.9   Hemoglobin 13.0 - 17.0 g/dL  8.2  8.3  7.9   Hematocrit 39.0 - 52.0 % 24.9  25.5  24.4   Platelets 150 - 400 K/uL 328  316  306       Latest Ref Rng & Units 02/02/2023    8:10 AM 02/01/2023    9:49 AM 01/31/2023    8:18 AM  CMP  Glucose 70 - 99 mg/dL 74  80  604   BUN 6 - 20 mg/dL 540  981  191   Creatinine 0.61 - 1.24 mg/dL 47.82  95.62  13.08   Sodium 135 - 145 mmol/L 132  131  130   Potassium 3.5 - 5.1 mmol/L 5.8  5.2  5.2   Chloride 98 - 111 mmol/L 96  93  95   CO2 22 - 32 mmol/L 14  14  14    Calcium 8.9 - 10.3 mg/dL 8.6  8.5  8.3      Pertinent Imaging today Plain films and CT images have been personally visualized and interpreted; radiology reports have been reviewed. Decision making incorporated into the Impression /   IR Venocavagram Ivc  Result Date: 02/02/2023 INDICATION: 47 year old male with chronic renal failure, has had multiple prior surgical dialysis circuit performed, multiple prior catheter. 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 The current left femoral tunneled hemodialysis catheter is nonfunctioning, position within collateral venous drainage of the left pelvis. He presents today for possible venous reconstruction and tunneled catheter exchange, versus possible transhepatic dialysis catheter placement. EXAM: IMAGE GUIDED EXCHANGE OF TUNNELED HEMODIALYSIS CATHETER LEFT LOWER EXTREMITY VENOGRAM BALLOON ANGIOPLASTY AND STENT RECONSTRUCTION OF LEFT COMMON ILIAC VEIN OCCLUSION MEDICATIONS: 2 g Ancef. The antibiotic was given in an appropriate time interval prior to skin puncture. 6000 units IV heparin ANESTHESIA/SEDATION: Moderate (conscious) sedation was employed during this procedure. A total of Versed 2.0 mg and Fentanyl 200 mcg was administered intravenously by the radiology nurse. Total intra-service moderate Sedation Time: 43 minutes. The patient's level of consciousness and vital signs were  monitored continuously by radiology nursing throughout the procedure under my direct supervision. FLUOROSCOPY: Radiation Exposure Index (as provided by the fluoroscopic device): 908 mGy Kerma COMPLICATIONS: None PROCEDURE: Informed written consent was obtained from the patient after a discussion of the risks, benefits, and alternatives to treatment. Questions regarding the procedure were encouraged and answered. The left inguinal region including the indwelling catheter were prepped with chlorhexidine in a sterile fashion, and a sterile drape was applied covering the operative field. Maximum barrier sterile technique with sterile gowns and gloves were used for the procedure. A timeout was performed prior to the initiation  of the procedure. Spot images of the pelvis performed. 1% lidocaine was used for local anesthesia. Bentson wire was advanced through the nonfunctioning tunneled hemodialysis catheter. A standard 9 French sheath was placed on the wire. The sheath was not long enough to enter the vein for a durable purchase. A 10 French tips sheath was then placed on the Bentson wire into the iliac venous system. Dilator was removed and venogram performed. A combination of a never cross crossing catheter and a stiff Glidewire were used to cross the left common iliac vein occlusion. Once the catheter was in the IVC the wire was removed. Venogram was performed. Bentson wire was then passed into the IVC. Venogram at the sheath was performed. Balloon angioplasty was then performed of the occluded iliac vein, 12 mm x 40 mm. The sheath was then advanced into the lower IVC for repeat venogram. Sheath was withdrawn. We then placed the crossing catheter coaxial alongside the Bentson wire, for a somewhat tenia sh injection into the right common iliac vein to establish the right common iliac inflow. Catheter was removed and then a final balloon angioplasty was then performed of infrarenal IVC stenosis, 12 mm x 40 mm. We elected  then 2 stent the left common iliac vein, knowing that the durability of the occluded venous segment would be poor without stenting. A 14 mm x 60 mm dedicated operate venous stent was deployed. 14 mm balloon angioplasty was then performed for post dilation. We then elected to place a 28 cm tip to cuff tunneled hemodialysis catheter, which was placed on the Bentson wire. The wire and the introducers were removed. The catheter aspirates well at the conclusion. Final images were stored. The catheter aspirates and flushes normally. The catheter was flushed with appropriate volume heparin dwells. The catheter exit site was secured with a 0-Prolene retention suture. Dressings were applied. The patient tolerated the procedure well without immediate post procedural complication. FINDINGS: Current catheter is nonfunctional, with inability to aspirate the blue and red hubs. Venogram of the left lower extremity demonstrates patent confluence of the left internal iliac vein and external iliac vein, with occlusion of the common iliac vein. Suprarenal IVC occlusion. Flow is reversed from the IVC through the renal veins. The renal veins drain the infrarenal IVC flow via collateral network. 50% stenosis of the infrarenal IVC secondary to chronic venous disease. Balloon angioplasty and stenting of left common iliac vein, restored patency after placement of 14 mm diameter dedicated venous stent. Operational tunneled catheter at the conclusion. We elected to place a 28 cm tip to cuff catheter. A 33 cm tip to cath catheter would also be reasonable. IMPRESSION: Status post left lower extremity venogram, angioplasty and stenting of iliac venous occlusion, angioplasty of infrarenal IVC stenosis, and replacement of left femoral vein tunneled hemodialysis catheter with a new 28 cm tip to cuff catheter. Signed, Yvone Neu. Miachel Roux, RPVI [facility] Radiology Electronically Signed   By:  Gilmer Mor D.O.   On: 02/02/2023 13:03   IR Fluoro Guide CV Line Left  Result Date: 02/02/2023 INDICATION: 47 year old male with chronic renal failure, has had multiple prior surgical dialysis circuit performed, multiple prior catheter. 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 The current left femoral tunneled hemodialysis catheter is nonfunctioning, position within collateral venous drainage of the left pelvis. He presents today for possible venous reconstruction and tunneled catheter  exchange, versus possible transhepatic dialysis catheter placement. EXAM: IMAGE GUIDED EXCHANGE OF TUNNELED HEMODIALYSIS CATHETER LEFT LOWER EXTREMITY VENOGRAM BALLOON ANGIOPLASTY AND STENT RECONSTRUCTION OF LEFT COMMON ILIAC VEIN OCCLUSION MEDICATIONS: 2 g Ancef. The antibiotic was given in an appropriate time interval prior to skin puncture. 6000 units IV heparin ANESTHESIA/SEDATION: Moderate (conscious) sedation was employed during this procedure. A total of Versed 2.0 mg and Fentanyl 200 mcg was administered intravenously by the radiology nurse. Total intra-service moderate Sedation Time: 43 minutes. The patient's level of consciousness and vital signs were monitored continuously by radiology nursing throughout the procedure under my direct supervision. FLUOROSCOPY: Radiation Exposure Index (as provided by the fluoroscopic device): 908 mGy Kerma COMPLICATIONS: None PROCEDURE: Informed written consent was obtained from the patient after a discussion of the risks, benefits, and alternatives to treatment. Questions regarding the procedure were encouraged and answered. The left inguinal region including the indwelling catheter were prepped with chlorhexidine in a sterile fashion, and a sterile drape was applied covering the operative field. Maximum barrier sterile technique with sterile gowns and gloves were used for the  procedure. A timeout was performed prior to the initiation of the procedure. Spot images of the pelvis performed. 1% lidocaine was used for local anesthesia. Bentson wire was advanced through the nonfunctioning tunneled hemodialysis catheter. A standard 9 French sheath was placed on the wire. The sheath was not long enough to enter the vein for a durable purchase. A 10 French tips sheath was then placed on the Bentson wire into the iliac venous system. Dilator was removed and venogram performed. A combination of a never cross crossing catheter and a stiff Glidewire were used to cross the left common iliac vein occlusion. Once the catheter was in the IVC the wire was removed. Venogram was performed. Bentson wire was then passed into the IVC. Venogram at the sheath was performed. Balloon angioplasty was then performed of the occluded iliac vein, 12 mm x 40 mm. The sheath was then advanced into the lower IVC for repeat venogram. Sheath was withdrawn. We then placed the crossing catheter coaxial alongside the Bentson wire, for a somewhat tenia sh injection into the right common iliac vein to establish the right common iliac inflow. Catheter was removed and then a final balloon angioplasty was then performed of infrarenal IVC stenosis, 12 mm x 40 mm. We elected then 2 stent the left common iliac vein, knowing that the durability of the occluded venous segment would be poor without stenting. A 14 mm x 60 mm dedicated operate venous stent was deployed. 14 mm balloon angioplasty was then performed for post dilation. We then elected to place a 28 cm tip to cuff tunneled hemodialysis catheter, which was placed on the Bentson wire. The wire and the introducers were removed. The catheter aspirates well at the conclusion. Final images were stored. The catheter aspirates and flushes normally. The catheter was flushed with appropriate volume heparin dwells. The catheter exit site was secured with a 0-Prolene retention suture.  Dressings were applied. The patient tolerated the procedure well without immediate post procedural complication. FINDINGS: Current catheter is nonfunctional, with inability to aspirate the blue and red hubs. Venogram of the left lower extremity demonstrates patent confluence of the left internal iliac vein and external iliac vein, with occlusion of the common iliac vein. Suprarenal IVC occlusion. Flow is reversed from the IVC through the renal veins. The renal veins drain the infrarenal IVC flow via collateral network. 50% stenosis of the infrarenal IVC secondary to chronic  venous disease. Balloon angioplasty and stenting of left common iliac vein, restored patency after placement of 14 mm diameter dedicated venous stent. Operational tunneled catheter at the conclusion. We elected to place a 28 cm tip to cuff catheter. A 33 cm tip to cath catheter would also be reasonable. IMPRESSION: Status post left lower extremity venogram, angioplasty and stenting of iliac venous occlusion, angioplasty of infrarenal IVC stenosis, and replacement of left femoral vein tunneled hemodialysis catheter with a new 28 cm tip to cuff catheter. Signed, Yvone Neu. Miachel Roux, RPVI [facility] Radiology Electronically Signed   By: Gilmer Mor D.O.   On: 02/02/2023 13:03   IR PTS AND STENT ADDL CENTRAL DIALY SEG THRU DIALY CIRCUIT LEFT  Result Date: 02/02/2023 INDICATION: 47 year old male with chronic renal failure, has had multiple prior surgical dialysis circuit performed, multiple prior catheter. 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 The current left femoral tunneled hemodialysis catheter is nonfunctioning, position within collateral venous drainage of the left pelvis. He presents today for possible venous reconstruction and tunneled catheter exchange, versus possible  transhepatic dialysis catheter placement. EXAM: IMAGE GUIDED EXCHANGE OF TUNNELED HEMODIALYSIS CATHETER LEFT LOWER EXTREMITY VENOGRAM BALLOON ANGIOPLASTY AND STENT RECONSTRUCTION OF LEFT COMMON ILIAC VEIN OCCLUSION MEDICATIONS: 2 g Ancef. The antibiotic was given in an appropriate time interval prior to skin puncture. 6000 units IV heparin ANESTHESIA/SEDATION: Moderate (conscious) sedation was employed during this procedure. A total of Versed 2.0 mg and Fentanyl 200 mcg was administered intravenously by the radiology nurse. Total intra-service moderate Sedation Time: 43 minutes. The patient's level of consciousness and vital signs were monitored continuously by radiology nursing throughout the procedure under my direct supervision. FLUOROSCOPY: Radiation Exposure Index (as provided by the fluoroscopic device): 908 mGy Kerma COMPLICATIONS: None PROCEDURE: Informed written consent was obtained from the patient after a discussion of the risks, benefits, and alternatives to treatment. Questions regarding the procedure were encouraged and answered. The left inguinal region including the indwelling catheter were prepped with chlorhexidine in a sterile fashion, and a sterile drape was applied covering the operative field. Maximum barrier sterile technique with sterile gowns and gloves were used for the procedure. A timeout was performed prior to the initiation of the procedure. Spot images of the pelvis performed. 1% lidocaine was used for local anesthesia. Bentson wire was advanced through the nonfunctioning tunneled hemodialysis catheter. A standard 9 French sheath was placed on the wire. The sheath was not long enough to enter the vein for a durable purchase. A 10 French tips sheath was then placed on the Bentson wire into the iliac venous system. Dilator was removed and venogram performed. A combination of a never cross crossing catheter and a stiff Glidewire were used to cross the left common iliac vein occlusion.  Once the catheter was in the IVC the wire was removed. Venogram was performed. Bentson wire was then passed into the IVC. Venogram at the sheath was performed. Balloon angioplasty was then performed of the occluded iliac vein, 12 mm x 40 mm. The sheath was then advanced into the lower IVC for repeat venogram. Sheath was withdrawn. We then placed the crossing catheter coaxial alongside the Bentson wire, for a somewhat tenia sh injection into the right common iliac vein to establish the right common iliac inflow. Catheter was removed and then a final balloon angioplasty was then performed of infrarenal IVC stenosis, 12  mm x 40 mm. We elected then 2 stent the left common iliac vein, knowing that the durability of the occluded venous segment would be poor without stenting. A 14 mm x 60 mm dedicated operate venous stent was deployed. 14 mm balloon angioplasty was then performed for post dilation. We then elected to place a 28 cm tip to cuff tunneled hemodialysis catheter, which was placed on the Bentson wire. The wire and the introducers were removed. The catheter aspirates well at the conclusion. Final images were stored. The catheter aspirates and flushes normally. The catheter was flushed with appropriate volume heparin dwells. The catheter exit site was secured with a 0-Prolene retention suture. Dressings were applied. The patient tolerated the procedure well without immediate post procedural complication. FINDINGS: Current catheter is nonfunctional, with inability to aspirate the blue and red hubs. Venogram of the left lower extremity demonstrates patent confluence of the left internal iliac vein and external iliac vein, with occlusion of the common iliac vein. Suprarenal IVC occlusion. Flow is reversed from the IVC through the renal veins. The renal veins drain the infrarenal IVC flow via collateral network. 50% stenosis of the infrarenal IVC secondary to chronic venous disease. Balloon angioplasty and stenting of  left common iliac vein, restored patency after placement of 14 mm diameter dedicated venous stent. Operational tunneled catheter at the conclusion. We elected to place a 28 cm tip to cuff catheter. A 33 cm tip to cath catheter would also be reasonable. IMPRESSION: Status post left lower extremity venogram, angioplasty and stenting of iliac venous occlusion, angioplasty of infrarenal IVC stenosis, and replacement of left femoral vein tunneled hemodialysis catheter with a new 28 cm tip to cuff catheter. Signed, Yvone Neu. Miachel Roux, RPVI [facility] Radiology Electronically Signed   By: Gilmer Mor D.O.   On: 02/02/2023 13:03   PERIPHERAL VASCULAR CATHETERIZATION  Result Date: 02/01/2023 See surgical note for result.  CT ABDOMEN PELVIS W CONTRAST  Result Date: 01/29/2023 CLINICAL DATA:  47 year old male with a history of renal failure, difficult access, possible transhepatic catheter. EXAM: CT ABDOMEN AND PELVIS WITH CONTRAST TECHNIQUE: Multidetector CT imaging of the abdomen and pelvis was performed using the standard protocol following bolus administration of intravenous contrast. RADIATION DOSE REDUCTION: This exam was performed according to the departmental dose-optimization program which includes automated exposure control, adjustment of the mA and/or kV according to patient size and/or use of iterative reconstruction technique. CONTRAST:  75mL OMNIPAQUE IOHEXOL 350 MG/ML SOLN COMPARISON:  04/14/2011 FINDINGS: Lower chest: No acute finding of the lower chest. Hepatobiliary: Timing of the contrast bolus is delayed secondary to collateral venous drainage centrally. Hepatomegaly. No focal lesion. Focal fatty infiltration adjacent to the falciform ligament. Gallbladder is decompressed. Pancreas: Unremarkable Spleen: Unremarkable Adrenals/Urinary Tract: - Right adrenal gland:  Unremarkable - Left adrenal gland: Unremarkable. - Right kidney: Atrophic right  kidney with nephrolithiasis. No hydronephrosis. - Left Kidney: Atrophic left kidney with nephrolithiasis. No hydronephrosis. - Urinary Bladder: Urinary bladder is decompressed. Stomach/Bowel: - Stomach: Unremarkable. - Small bowel: Unremarkable - Appendix: Normal - Colon: Colon decompressed. Colonic diverticula of the left colon. No acute inflammatory changes. Vascular/Lymphatic: Calcifications of the coronary arteries of the visualized heart. Partial opacification of collateral draining veins of the left chest. Hepatic veins not well opacified given the timing of the contrast bolus. Portal veins not well opacified given the timing of the contrast bolus. Mild atherosclerotic changes of the abdominal aorta. Mild atherosclerotic changes of the bilateral iliac arteries. Proximal  femoral arteries are patent. Tunneled left femoral dialysis catheter. Multiple lymph nodes in the retroperitoneum with associated edema. 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 Surgical changes of bilateral femoral loop grafts, with surgical changes of prior excision on the right. Reproductive: Unremarkable prostate Other: Low-density cystic structure in the abdominal wall measures 5.7 cm with some rim calcification. This is secondary to a prior peritoneal dialysis catheter. Rim calcified cystic structure within the mesentery measures 5.8 cm, likely a pseudocyst related to prior peritoneal dialysis. Partially calcified structure in the right lower pelvis, compatible with failed renal transplant. Musculoskeletal: Changes of renal osteodystrophy. No acute displaced fracture. No bony canal narrowing. Unremarkable hips. IMPRESSION: No acute CT finding. Poor opacification of the hepatic and portal venous vasculature secondary to the timing of the contrast bolus. However, there are no secondary signs of hepatic vein occlusion identified in the liver. These above preliminary  results were discussed by telephone at the time of interpretation on 01/29/2023 at 4:38 pm with Dr. Heath Lark. 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 Aortic Atherosclerosis (ICD10-I70.0). Additional ancillary findings as above. Signed, Yvone Neu. Miachel Roux, RPVI [facility] Radiology Electronically Signed   By: Gilmer Mor D.O.   On: 01/29/2023 16:39   IR Fluoro Guide CV Line Left  Result Date: 01/29/2023 INDICATION: Bacteremia. Malfunctioning left common femoral vein approach hemodialysis catheter. Patient with longstanding history of end-stage renal disease renal disease with extremely limited dialysis catheter access, currently receiving intermittently successful dialysis from a chronic left common femoral approach hemodialysis catheter, initially placed by the interventional radiology service on 04/23/2022, by report, most recently exchanged by the vascular surgery service approximately 1 month ago. The patient presents today for fluoroscopic guided dialysis catheter exchange. EXAM: TUNNELED CENTRAL VENOUS HEMODIALYSIS CATHETER REPLACEMENT WITH FLUOROSCOPIC GUIDANCE MEDICATIONS: None CONTRAST:  25 cc Omnipaque 300 FLUOROSCOPY TIME:  367 mGy COMPLICATIONS: None immediate. PROCEDURE: Informed written consent was obtained from the patient after a discussion of the risks, benefits, and alternatives to treatment. Questions regarding the procedure were encouraged and answered. The skin and external portion of the existing hemodialysis catheter was prepped with chlorhexidine in a sterile fashion, and a sterile drape was applied covering the operative field. Maximum barrier sterile technique with sterile gowns and gloves were used for the procedure. A timeout was performed prior to the initiation of the procedure.  Preprocedural spot fluoroscopic image was obtained of the left pelvis and left common femoral vein approach hemodialysis catheter. The hemodialysis catheter was noted to be located within an atypical positioned, to the left of the L5 vertebral body. Neither lumen of the pre-existing hemodialysis catheter were able to be aspirated. Pull-back venogram performed via the pre-existing hemodialysis catheter demonstrates chronic occlusion of both the IVC as well as the pelvic venous system with filling of multiple para lumbar venous collaterals. Next, two stiff glide wires were advanced through both lumens of the dialysis catheter and exchanged for a new, 23 cm tip to cuff hemodialysis catheter. After catheter exchange, neither lumen was noted to aspirate. As such, the catheter was slightly retracted to the level of the left external iliac vein where some blood return was noted. The dialysis catheter was secured in place at this location. The patient tolerated the procedure well without immediate post procedural complication. IMPRESSION: Technically successful  fluoroscopic guided exchange and repositioning of 23 cm tip to cuff tunneled left common femoral vein approach hemodialysis catheter with tip terminating within the left external iliac vein. Neither lumen of the exchanged dialysis catheter was able to be easily aspirated however the pre-existing catheter demonstrated similar functionality and reportedly has been serving as a source of intermittent successful dialysis and as such the exchanged catheter was left in place for attempted dialysis. PLAN: If this exchanged dialysis catheter does NOT serve as a durable source of successful dialysis, agree with evaluation for consideration of trans hepatic dialysis catheter placement as patient has exhausted all other options for dialysis access. Electronically Signed   By: Simonne Come M.D.   On: 01/29/2023 16:14   IR Veno/Ext/Uni Left  Result Date: 01/29/2023 INDICATION:  Bacteremia. Malfunctioning left common femoral vein approach hemodialysis catheter. Patient with longstanding history of end-stage renal disease renal disease with extremely limited dialysis catheter access, currently receiving intermittently successful dialysis from a chronic left common femoral approach hemodialysis catheter, initially placed by the interventional radiology service on 04/23/2022, by report, most recently exchanged by the vascular surgery service approximately 1 month ago. The patient presents today for fluoroscopic guided dialysis catheter exchange. EXAM: TUNNELED CENTRAL VENOUS HEMODIALYSIS CATHETER REPLACEMENT WITH FLUOROSCOPIC GUIDANCE MEDICATIONS: None CONTRAST:  25 cc Omnipaque 300 FLUOROSCOPY TIME:  367 mGy COMPLICATIONS: None immediate. PROCEDURE: Informed written consent was obtained from the patient after a discussion of the risks, benefits, and alternatives to treatment. Questions regarding the procedure were encouraged and answered. The skin and external portion of the existing hemodialysis catheter was prepped with chlorhexidine in a sterile fashion, and a sterile drape was applied covering the operative field. Maximum barrier sterile technique with sterile gowns and gloves were used for the procedure. A timeout was performed prior to the initiation of the procedure. Preprocedural spot fluoroscopic image was obtained of the left pelvis and left common femoral vein approach hemodialysis catheter. The hemodialysis catheter was noted to be located within an atypical positioned, to the left of the L5 vertebral body. Neither lumen of the pre-existing hemodialysis catheter were able to be aspirated. Pull-back venogram performed via the pre-existing hemodialysis catheter demonstrates chronic occlusion of both the IVC as well as the pelvic venous system with filling of multiple para lumbar venous collaterals. Next, two stiff glide wires were advanced through both lumens of the dialysis  catheter and exchanged for a new, 23 cm tip to cuff hemodialysis catheter. After catheter exchange, neither lumen was noted to aspirate. As such, the catheter was slightly retracted to the level of the left external iliac vein where some blood return was noted. The dialysis catheter was secured in place at this location. The patient tolerated the procedure well without immediate post procedural complication. IMPRESSION: Technically successful fluoroscopic guided exchange and repositioning of 23 cm tip to cuff tunneled left common femoral vein approach hemodialysis catheter with tip terminating within the left external iliac vein. Neither lumen of the exchanged dialysis catheter was able to be easily aspirated however the pre-existing catheter demonstrated similar functionality and reportedly has been serving as a source of intermittent successful dialysis and as such the exchanged catheter was left in place for attempted dialysis. PLAN: If this exchanged dialysis catheter does NOT serve as a durable source of successful dialysis, agree with evaluation for consideration of trans hepatic dialysis catheter placement as patient has exhausted all other options for dialysis access. Electronically Signed   By: Simonne Come M.D.   On: 01/29/2023 16:14  MR FOOT RIGHT WO CONTRAST  Result Date: 01/29/2023 CLINICAL DATA:  Right foot infection. Great toe amputation on 12/12/2022 EXAM: MRI OF THE RIGHT FOREFOOT WITHOUT CONTRAST TECHNIQUE: Multiplanar, multisequence MR imaging of the right forefoot was performed. No intravenous contrast was administered. COMPARISON:  X-ray 01/27/2023 FINDINGS: Bones/Joint/Cartilage Postsurgical changes from transmetatarsal amputation of the first ray. There is bone marrow edema within the distal 7 mm of the residual first metatarsal diaphysis at the resection margin. No confluent low T1 signal changes at this location. The remaining bony structures of the forefoot are otherwise within normal  limits. No fracture or dislocation. No additional sites of focal bone marrow edema or marrow replacement. No joint effusions. Ligaments Intact Lisfranc ligament.  Intact collateral ligaments. Muscles and Tendons Diffuse edema-like signal of the foot musculature which may represent changes related to denervation and/or myositis. No tenosynovitis. Soft tissues Diffuse soft tissue swelling. Irregularity within the skin and superficial soft tissues at the medial aspect of the forefoot, likely along the surgical incision site. There is fluid and edema which tracks to the resection margin of the first metatarsal (series 4, image 29). There are no organized or drainable fluid collections. IMPRESSION: 1. 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 There is bone marrow edema within the distal aspect of the residual first metatarsal diaphysis at the resection margin. Appearance is most suggestive of early acute osteomyelitis given the appearance of the overlying soft tissues. 2. Fluid and edema tracks to the resection margin of the first metatarsal. 3. No organized or drainable fluid collections. 4. Diffuse edema-like signal of the foot musculature which may represent changes related to denervation and/or myositis. Electronically Signed   By: Duanne Guess D.O.   On: 01/29/2023 14:39   DG Foot Complete Right  Result Date: 01/27/2023 CLINICAL DATA:  Osteomyelitis. Status post amputation. Possible sepsis. EXAM: RIGHT FOOT COMPLETE - 3+ VIEW COMPARISON:  Right foot radiographs 12/12/2022 and 12/08/2022 FINDINGS: Redemonstration of amputation of the great toe to the mid metatarsal shaft. Within the limitations of recent surgery, no definite new cortical erosion is seen at the distal amputation site. There is soft tissue swelling at the amputation site. Interval decrease in air at the distal amputation site compared to immediate postoperative radiographs 12/12/2022. Moderate plantar calcaneal heel  spur. No acute fracture or dislocation. IMPRESSION: Redemonstration of amputation of the great toe to the mid metatarsal shaft. Within the limitations of recent surgery, no definite new cortical erosion is seen at the distal amputation site. Electronically Signed   By: Neita Garnet M.D.   On: 01/27/2023 14:51   DG Chest Portable 1 View  Result Date: 01/27/2023 CLINICAL DATA:  Sepsis.  Toe amputation.  Possible sepsis. EXAM: PORTABLE CHEST 1 VIEW COMPARISON:  Chest radiographs 02/15/2021, 12/06/2022, 12/07/2022 FINDINGS: Moderately decreased lung volumes. Cardiac silhouette and mediastinal contours are within normal limits for technique and low lung volumes. No focal airspace opacity is seen. No pulmonary edema, pleural effusion, or pneumothorax. No acute skeletal abnormality. IMPRESSION: Low lung volumes. No pneumonia is identified. Electronically Signed   By: Neita Garnet M.D.   On: 01/27/2023 14:46    I have personally spent 55  minutes involved in face-to-face and non-face-to-face activities for this patient on the day of the visit. Professional time spent includes the following activities: Preparing to see the patient (review of tests), Obtaining and/or reviewing separately obtained history (admission/discharge record), Performing a medically appropriate examination and/or evaluation , Ordering medications/tests/procedures, referring and communicating with other health  care professionals, Documenting clinical information in the EMR, Independently interpreting results (not separately reported), Communicating results to the patient/family/caregiver, Counseling and educating the patient/family/caregiver and Care coordination (not separately reported).   Plan d/w requesting provider as well as ID pharm D  Note: This document was prepared using dragon voice recognition software and may include unintentional dictation errors.   Electronically signed by:   [name], MD Infectious Disease  Physician [facility] Pager: [phone]

## 2023-02-03 NOTE — Progress Notes (Signed)
Called for report unable to obtain at this time unit secretary stated nurse was in room and to call back.

## 2023-02-03 NOTE — Progress Notes (Signed)
ANTICOAGULATION CONSULT NOTE  Pharmacy Consult for Eliquis PTA  Indication: atrial fibrillation  No Known Allergies  Patient Measurements: Height: 6\' 2"  (188 cm) Weight: (!) 148.1 kg (326 lb 8 oz) IBW/kg (Calculated) : 82.2 Heparin Dosing Weight: 114 kg  Vital Signs: Temp: 98.7 F (37.1 C) (08/27 0937) Temp Source: Oral (08/27 0937) BP: 126/72 (08/27 1140) Pulse Rate: 89 (08/27 1140)  Labs: Recent Labs    02/01/23 0949 02/02/23 0810 02/03/23 0953  HGB 8.3* 8.2*  --   HCT 25.5* 24.9*  --   PLT 316 328  --   LABPROT  --  17.1*  --   INR  --  1.4*  --   HEPARINUNFRC 0.43  --  0.25*  CREATININE 22.65* 24.66* 16.15*    Estimated Creatinine Clearance: 8.6 mL/min (A) (by C-G formula based on SCr of 16.15 mg/dL (H)).   Medical History: Past Medical History:  Diagnosis Date   Acute respiratory failure with hypoxia (HCC) 11/30/2018   Anemia    ESRD   ESRD on hemodialysis (HCC) 06/07/2011   TTS [place] [facility]. Started HD in 2006, got transplant in 2014 lasted until Dec 2019 then went back on HD.  Has L thigh AVG as of Jun 2020.  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    History of blood transfusion    Hypertension    03/19/22- has not had high blood pressure in 5 years.   Morbid obesity (HCC)    Paroxysmal atrial fibrillation (HCC)    Pre-diabetes    no meds   Sepsis (HCC) 11/30/2018   Sleep apnea    lost weight, does not use CPAP    Medications:  Apixaban 5 mg PO BID prior to admission for atrial fibrillation  Assessment: 47 YOM who presented for septic shock with hypotension, lactic acid 2.2, requiring fluid resuscitation and vasopressors. Source of infection suspected to be tunneled dialysis catheter requiring possible replacement. Initiated heparin infusion in lieu of apixaban for potential line replacement. TDC replaced and working well. No further plans for procedures.   Goal of Therapy:  Monitor platelets by anticoagulation protocol: Yes   Plan:   Ok to resume PTA Eliquis 5mg  BID Discontinue heparin drip and associated labs/orders   Rexford Maus, PharmD, BCPS 02/03/2023 11:59 AM

## 2023-02-03 NOTE — Procedures (Signed)
Seen and examined on dialysis.  Blood pressure 100/63 on HR 78.   Tolerating goal.  Left fem tunn catheter in use.   Catheter is working well and worked well for yesterday's treatment.  Labs from today are not yet available - may be able to stop bicarb  Estanislado Emms, MD 02/03/2023  10:08 AM

## 2023-02-03 NOTE — Progress Notes (Signed)
Pharmacy Antibiotic Note  [name] is a 47 y.o. male admitted on 01/27/2023  with known history of MRSA foot osteo/abscess recently treated with Vancomycin for 4 weeks with HD (EOT ~8/2). This admission found to have proteus mirabilis bacteremia - possible sources include foot osteo vs femoral HD cath. Pharmacy has been consulted to resume Vancomycin + Flagyl, narrow Rocephin to Cefazolin.   Plan: - Vancomycin 1000mg  IV qMWF after HD - Continue Cefazolin 1g IV every 24 hours - Continue Flagyl 500 mg po bid - Will continue to follow HD schedule/duration, LOT, and antibiotic de-escalation plans  - Follow culture sensitivities - currently w/ staph aureus  Height: 6\' 2"  (188 cm) Weight: (!) 148.1 kg (326 lb 8 oz) IBW/kg (Calculated) : 82.2  Temp (24hrs), Avg:98.4 F (36.9 C), Min:97.9 F (36.6 C), Max:98.7 F (37.1 C)  Recent Labs  Lab 01/27/23 1125 01/27/23 1523 01/27/23 1742 01/27/23 2140 01/29/23 0833 01/30/23 0505 01/30/23 0722 01/31/23 0818 02/01/23 0949 02/02/23 0810  WBC  --   --   --    < > 9.4 11.1*  --  8.9 8.4 7.7  CREATININE  --   --   --    < >  --  20.48* 20.75* 22.18* 22.65* 24.66*  LATICACIDVEN 2.2* 1.0 1.0  --   --   --   --   --   --   --   VANCORANDOM  --   --   --    < > 24  --   --   --  18  --    < > = values in this interval not displayed.    Estimated Creatinine Clearance: 5.6 mL/min (A) (by C-G formula based on SCr of 24.66 mg/dL (H)).    No Known Allergies  Antimicrobials this admission: Cefepime 8/20 >> Rocephin 8/20 >> 8/22 Flagyl 8/20 >> 8/21; restart 8/22 Vancomycin 8/20 >> Cefazolin 8/23 >> 8/22 Cefazolin 8/23 >>   Dose adjustments this admission: N/a  Microbiology results: 8/20 COVID >> neg 8/20 MRSA PCR >> neg 8/20 BCx >> 1/4 Proteus mirabilis 8/22 BCx >> ngtd 8/25 R metatarsal bone: staph aureus 8/25 R foot wound: staph aureus  Thank you for allowing pharmacy to be a part of this patient's care.  Rexford Maus,  PharmD, BCPS 02/03/2023 9:54 AM  **Pharmacist phone directory can now be found on [email or web address] ([email or web address]).  Listed under [facility].

## 2023-02-04 DIAGNOSIS — R7881 Bacteremia: Secondary | ICD-10-CM | POA: Diagnosis not present

## 2023-02-04 LAB — RENAL FUNCTION PANEL
Albumin: 2.9 g/dL — ABNORMAL LOW (ref 3.5–5.0)
Anion gap: 14 (ref 5–15)
BUN: 48 mg/dL — ABNORMAL HIGH (ref 6–20)
CO2: 22 mmol/L (ref 22–32)
Calcium: 8.6 mg/dL — ABNORMAL LOW (ref 8.9–10.3)
Chloride: 95 mmol/L — ABNORMAL LOW (ref 98–111)
Creatinine, Ser: 12.85 mg/dL — ABNORMAL HIGH (ref 0.61–1.24)
GFR, Estimated: 4 mL/min — ABNORMAL LOW (ref >60)
Glucose, Bld: 81 mg/dL (ref 70–99)
Phosphorus: 6.5 mg/dL — ABNORMAL HIGH (ref 2.5–4.6)
Potassium: 4 mmol/L (ref 3.5–5.1)
Sodium: 131 mmol/L — ABNORMAL LOW (ref 135–145)

## 2023-02-04 LAB — CULTURE, BLOOD (ROUTINE X 2)
Culture: NO GROWTH
Culture: NO GROWTH
Special Requests: ADEQUATE
Special Requests: ADEQUATE

## 2023-02-04 LAB — SURGICAL PATHOLOGY

## 2023-02-04 MED ORDER — CHLORHEXIDINE GLUCONATE CLOTH 2 % EX PADS: 6.0000 | MEDICATED_PAD | Freq: Every day | CUTANEOUS | Status: DC

## 2023-02-04 NOTE — Consult Note (Signed)
WOC Nurse Consult Note: NPWT dressing change postop I&D  R foot and placement of skin substitute Dr. Lilian Kapur 02/01/2023 Reason for Consult: NPWT change  Wound type:full thickness, surgical as above  Pressure Injury POA: NA  Measurement: 12 cm x 2 cm  Wound bed: skin substitute in place with staples anchoring  Drainage (amount, consistency, odor) approximately 100 mls serosanguinous effluent in cannister  Periwound: intact  Dressing procedure/placement/frequency: Removed old NPWT dressing Cleansed wound with normal saline Periwound skin protected with skin barrier wipe; did cut a barrier ring in half, flattened half and placed around 2nd digit to enhance seal in this area Filled wound with  1 piece of Mepitel, 1 piece of black foam  Sealed NPWT dressing at HG; placed gauze between NPWT tubing and anterior aspect of patients foot and leg, wrapped entire dressing with Kerlix and secured with Ace wrap.    Patient did not desire any pain medication prior to dressing change and tolerated procedure well   WOC nurse will continue to provide NPWT dressing changes due to the complexity of the dressing change M-W-F. Next change Friday 02/06/2023.  1 Medium Vac dressing ordered for room, additional drape in room at this time.    Thank you,     Priscella Mann MSN, RN-BC, [employer] [phone]

## 2023-02-04 NOTE — Progress Notes (Addendum)
[facility] Progress Note   Subjective:  Seen in room - feels ok today. No CP/dyspnea. S/p back to back HD - BUN much improved. TDC has been working well.  Objective Vitals:   02/03/23 1423 02/03/23 2032 02/04/23 0425 02/04/23 0842  BP: 113/85 113/77 (!) 110/59 130/79  Pulse: 79 76 82 90  Resp: 20 16 16 17   Temp: 98.4 F (36.9 C) 98.2 F (36.8 C) 97.6 F (36.4 C) 97.7 F (36.5 C)  TempSrc:      SpO2: 100% 99% 97% 100%  Weight: (!) 146.1 kg     Height:       Physical Exam General: Well appearing man, NAD, room air Heart: RRR; no murmur Lungs: CTA anteriorly Abdomen: obese, soft, non-tender Extremities: No LE edema; R foot bandaged with wound vac Dialysis Access: L femoral TDC  Additional Objective Labs: Basic Metabolic Panel: Recent Labs  Lab 02/03/23 0953 02/03/23 1905 02/04/23 0438  NA 133* 132* 131*  K 4.5 3.7 4.0  CL 95* 93* 95*  CO2 23 24 22   GLUCOSE 103* 94 81  BUN 68* 42* 48*  CREATININE 16.15* 11.62* 12.85*  CALCIUM 8.2* 8.6* 8.6*  PHOS 8.4* 5.6* 6.5*   Liver Function Tests: Recent Labs  Lab 02/03/23 0953 02/03/23 1905 02/04/23 0438  ALBUMIN 2.7* 2.8* 2.9*   CBC: Recent Labs  Lab 01/29/23 0833 01/30/23 0505 01/31/23 0818 02/01/23 0949 02/02/23 0810  WBC 9.4 11.1* 8.9 8.4 7.7  HGB 8.0* 8.2* 7.9* 8.3* 8.2*  HCT 24.9* 25.5* 24.4* 25.5* 24.9*  MCV 86.2 84.7 84.4 86.1 85.6  PLT 271 303 306 316 328   Blood Culture    Component Value Date/Time   SDES WOUND 02/01/2023 1508   SPECREQUEST right foot 02/01/2023 1508   CULT  02/01/2023 1508    RARE STAPHYLOCOCCUS AUREUS SUSCEPTIBILITIES TO FOLLOW NO ANAEROBES ISOLATED; CULTURE IN PROGRESS FOR 5 DAYS    REPTSTATUS PENDING 02/01/2023 1508   Studies/Results: IR Venocavagram Ivc  Result Date: 02/02/2023 INDICATION: 47 year old male with chronic renal failure, has had multiple prior surgical dialysis circuit performed, multiple prior catheter. He has central venous occlusion  of the bilateral upper extremity. 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 The current left femoral tunneled hemodialysis catheter is nonfunctioning, position within collateral venous drainage of the left pelvis. He presents today for possible venous reconstruction and tunneled catheter exchange, versus possible transhepatic dialysis catheter placement. EXAM: IMAGE GUIDED EXCHANGE OF TUNNELED HEMODIALYSIS CATHETER LEFT LOWER EXTREMITY VENOGRAM BALLOON ANGIOPLASTY AND STENT RECONSTRUCTION OF LEFT COMMON ILIAC VEIN OCCLUSION MEDICATIONS: 2 g Ancef. The antibiotic was given in an appropriate time interval prior to skin puncture. 6000 units IV heparin ANESTHESIA/SEDATION: Moderate (conscious) sedation was employed during this procedure. A total of Versed 2.0 mg and Fentanyl 200 mcg was administered intravenously by the radiology nurse. Total intra-service moderate Sedation Time: 43 minutes. The patient's level of consciousness and vital signs were monitored continuously by radiology nursing throughout the procedure under my direct supervision. FLUOROSCOPY: Radiation Exposure Index (as provided by the fluoroscopic device): 908 mGy Kerma COMPLICATIONS: None PROCEDURE: Informed written consent was obtained from the patient after a discussion of the risks, benefits, and alternatives to treatment. Questions regarding the procedure were encouraged and answered. The left inguinal region including the indwelling catheter were prepped with chlorhexidine in a sterile fashion, and a sterile drape was applied covering the operative field. Maximum barrier sterile technique with sterile gowns and gloves were used for the  procedure. A timeout was performed prior to the initiation of the procedure. Spot images of the pelvis performed. 1% lidocaine was used for local anesthesia. Bentson wire was advanced through the nonfunctioning tunneled hemodialysis catheter. A standard 9 French sheath was placed  on the wire. The sheath was not long enough to enter the vein for a durable purchase. A 10 French tips sheath was then placed on the Bentson wire into the iliac venous system. Dilator was removed and venogram performed. A combination of a never cross crossing catheter and a stiff Glidewire were used to cross the left common iliac vein occlusion. Once the catheter was in the IVC the wire was removed. Venogram was performed. Bentson wire was then passed into the IVC. Venogram at the sheath was performed. Balloon angioplasty was then performed of the occluded iliac vein, 12 mm x 40 mm. The sheath was then advanced into the lower IVC for repeat venogram. Sheath was withdrawn. We then placed the crossing catheter coaxial alongside the Bentson wire, for a somewhat tenia sh injection into the right common iliac vein to establish the right common iliac inflow. Catheter was removed and then a final balloon angioplasty was then performed of infrarenal IVC stenosis, 12 mm x 40 mm. We elected then 2 stent the left common iliac vein, knowing that the durability of the occluded venous segment would be poor without stenting. A 14 mm x 60 mm dedicated operate venous stent was deployed. 14 mm balloon angioplasty was then performed for post dilation. We then elected to place a 28 cm tip to cuff tunneled hemodialysis catheter, which was placed on the Bentson wire. The wire and the introducers were removed. The catheter aspirates well at the conclusion. Final images were stored. The catheter aspirates and flushes normally. The catheter was flushed with appropriate volume heparin dwells. The catheter exit site was secured with a 0-Prolene retention suture. Dressings were applied. The patient tolerated the procedure well without immediate post procedural complication. FINDINGS: Current catheter is nonfunctional, with inability to aspirate the blue and red hubs. Venogram of the left lower extremity demonstrates patent confluence of the  left internal iliac vein and external iliac vein, with occlusion of the common iliac vein. Suprarenal IVC occlusion. Flow is reversed from the IVC through the renal veins. The renal veins drain the infrarenal IVC flow via collateral network. 50% stenosis of the infrarenal IVC secondary to chronic venous disease. Balloon angioplasty and stenting of left common iliac vein, restored patency after placement of 14 mm diameter dedicated venous stent. Operational tunneled catheter at the conclusion. We elected to place a 28 cm tip to cuff catheter. A 33 cm tip to cath catheter would also be reasonable. IMPRESSION: Status post left lower extremity venogram, angioplasty and stenting of iliac venous occlusion, angioplasty of infrarenal IVC stenosis, and replacement of left femoral vein tunneled hemodialysis catheter with a new 28 cm tip to cuff catheter. Signed, Yvone Neu. Miachel Roux, RPVI [facility] Radiology Electronically Signed   By: Gilmer Mor D.O.   On: 02/02/2023 13:03   IR Fluoro Guide CV Line Left  Result Date: 02/02/2023 INDICATION: 47 year old male with chronic renal failure, has had multiple prior surgical dialysis circuit performed, multiple prior catheter. 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 The current left femoral tunneled hemodialysis catheter is nonfunctioning, position within collateral venous drainage of the left pelvis. He  presents today for possible venous reconstruction and tunneled catheter exchange, versus possible transhepatic dialysis catheter placement. EXAM: IMAGE GUIDED EXCHANGE OF TUNNELED HEMODIALYSIS CATHETER LEFT LOWER EXTREMITY VENOGRAM BALLOON ANGIOPLASTY AND STENT RECONSTRUCTION OF LEFT COMMON ILIAC VEIN OCCLUSION MEDICATIONS: 2 g Ancef. The antibiotic was given in an appropriate time interval prior to skin puncture. 6000  units IV heparin ANESTHESIA/SEDATION: Moderate (conscious) sedation was employed during this procedure. A total of Versed 2.0 mg and Fentanyl 200 mcg was administered intravenously by the radiology nurse. Total intra-service moderate Sedation Time: 43 minutes. The patient's level of consciousness and vital signs were monitored continuously by radiology nursing throughout the procedure under my direct supervision. FLUOROSCOPY: Radiation Exposure Index (as provided by the fluoroscopic device): 908 mGy Kerma COMPLICATIONS: None PROCEDURE: Informed written consent was obtained from the patient after a discussion of the risks, benefits, and alternatives to treatment. Questions regarding the procedure were encouraged and answered. The left inguinal region including the indwelling catheter were prepped with chlorhexidine in a sterile fashion, and a sterile drape was applied covering the operative field. Maximum barrier sterile technique with sterile gowns and gloves were used for the procedure. A timeout was performed prior to the initiation of the procedure. Spot images of the pelvis performed. 1% lidocaine was used for local anesthesia. Bentson wire was advanced through the nonfunctioning tunneled hemodialysis catheter. A standard 9 French sheath was placed on the wire. The sheath was not long enough to enter the vein for a durable purchase. A 10 French tips sheath was then placed on the Bentson wire into the iliac venous system. Dilator was removed and venogram performed. A combination of a never cross crossing catheter and a stiff Glidewire were used to cross the left common iliac vein occlusion. Once the catheter was in the IVC the wire was removed. Venogram was performed. Bentson wire was then passed into the IVC. Venogram at the sheath was performed. Balloon angioplasty was then performed of the occluded iliac vein, 12 mm x 40 mm. The sheath was then advanced into the lower IVC for repeat venogram. Sheath was  withdrawn. We then placed the crossing catheter coaxial alongside the Bentson wire, for a somewhat tenia sh injection into the right common iliac vein to establish the right common iliac inflow. Catheter was removed and then a final balloon angioplasty was then performed of infrarenal IVC stenosis, 12 mm x 40 mm. We elected then 2 stent the left common iliac vein, knowing that the durability of the occluded venous segment would be poor without stenting. A 14 mm x 60 mm dedicated operate venous stent was deployed. 14 mm balloon angioplasty was then performed for post dilation. We then elected to place a 28 cm tip to cuff tunneled hemodialysis catheter, which was placed on the Bentson wire. The wire and the introducers were removed. The catheter aspirates well at the conclusion. Final images were stored. The catheter aspirates and flushes normally. The catheter was flushed with appropriate volume heparin dwells. The catheter exit site was secured with a 0-Prolene retention suture. Dressings were applied. The patient tolerated the procedure well without immediate post procedural complication. FINDINGS: Current catheter is nonfunctional, with inability to aspirate the blue and red hubs. Venogram of the left lower extremity demonstrates patent confluence of the left internal iliac vein and external iliac vein, with occlusion of the common iliac vein. Suprarenal IVC occlusion. Flow is reversed from the IVC through the renal veins. The renal veins drain the infrarenal IVC flow via collateral network.  50% stenosis of the infrarenal IVC secondary to chronic venous disease. Balloon angioplasty and stenting of left common iliac vein, restored patency after placement of 14 mm diameter dedicated venous stent. Operational tunneled catheter at the conclusion. We elected to place a 28 cm tip to cuff catheter. A 33 cm tip to cath catheter would also be reasonable. IMPRESSION: Status post left lower extremity venogram, angioplasty  and stenting of iliac venous occlusion, angioplasty of infrarenal IVC stenosis, and replacement of left femoral vein tunneled hemodialysis catheter with a new 28 cm tip to cuff catheter. Signed, Yvone Neu. Miachel Roux, RPVI [facility] Radiology Electronically Signed   By: Gilmer Mor D.O.   On: 02/02/2023 13:03   IR PTS AND STENT ADDL CENTRAL DIALY SEG THRU DIALY CIRCUIT LEFT  Result Date: 02/02/2023 INDICATION: 47 year old male with chronic renal failure, has had multiple prior surgical dialysis circuit performed, multiple prior catheter. 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 The current left femoral tunneled hemodialysis catheter is nonfunctioning, position within collateral venous drainage of the left pelvis. He presents today for possible venous reconstruction and tunneled catheter exchange, versus possible transhepatic dialysis catheter placement. EXAM: IMAGE GUIDED EXCHANGE OF TUNNELED HEMODIALYSIS CATHETER LEFT LOWER EXTREMITY VENOGRAM BALLOON ANGIOPLASTY AND STENT RECONSTRUCTION OF LEFT COMMON ILIAC VEIN OCCLUSION MEDICATIONS: 2 g Ancef. The antibiotic was given in an appropriate time interval prior to skin puncture. 6000 units IV heparin ANESTHESIA/SEDATION: Moderate (conscious) sedation was employed during this procedure. A total of Versed 2.0 mg and Fentanyl 200 mcg was administered intravenously by the radiology nurse. Total intra-service moderate Sedation Time: 43 minutes. The patient's level of consciousness and vital signs were monitored continuously by radiology nursing throughout the procedure under my direct supervision. FLUOROSCOPY: Radiation Exposure Index (as provided by the fluoroscopic device): 908 mGy Kerma COMPLICATIONS: None PROCEDURE: Informed written consent was obtained from the patient after a discussion of the risks,  benefits, and alternatives to treatment. Questions regarding the procedure were encouraged and answered. The left inguinal region including the indwelling catheter were prepped with chlorhexidine in a sterile fashion, and a sterile drape was applied covering the operative field. Maximum barrier sterile technique with sterile gowns and gloves were used for the procedure. A timeout was performed prior to the initiation of the procedure. Spot images of the pelvis performed. 1% lidocaine was used for local anesthesia. Bentson wire was advanced through the nonfunctioning tunneled hemodialysis catheter. A standard 9 French sheath was placed on the wire. The sheath was not long enough to enter the vein for a durable purchase. A 10 French tips sheath was then placed on the Bentson wire into the iliac venous system. Dilator was removed and venogram performed. A combination of a never cross crossing catheter and a stiff Glidewire were used to cross the left common iliac vein occlusion. Once the catheter was in the IVC the wire was removed. Venogram was performed. Bentson wire was then passed into the IVC. Venogram at the sheath was performed. Balloon angioplasty was then performed of the occluded iliac vein, 12 mm x 40 mm. The sheath was then advanced into the lower IVC for repeat venogram. Sheath was withdrawn. We then placed the crossing catheter coaxial alongside the Bentson wire, for a somewhat tenia sh injection into the right common iliac vein to establish the right common iliac inflow. Catheter was removed and then a final balloon  angioplasty was then performed of infrarenal IVC stenosis, 12 mm x 40 mm. We elected then 2 stent the left common iliac vein, knowing that the durability of the occluded venous segment would be poor without stenting. A 14 mm x 60 mm dedicated operate venous stent was deployed. 14 mm balloon angioplasty was then performed for post dilation. We then elected to place a 28 cm tip to cuff tunneled  hemodialysis catheter, which was placed on the Bentson wire. The wire and the introducers were removed. The catheter aspirates well at the conclusion. Final images were stored. The catheter aspirates and flushes normally. The catheter was flushed with appropriate volume heparin dwells. The catheter exit site was secured with a 0-Prolene retention suture. Dressings were applied. The patient tolerated the procedure well without immediate post procedural complication. FINDINGS: Current catheter is nonfunctional, with inability to aspirate the blue and red hubs. Venogram of the left lower extremity demonstrates patent confluence of the left internal iliac vein and external iliac vein, with occlusion of the common iliac vein. Suprarenal IVC occlusion. Flow is reversed from the IVC through the renal veins. The renal veins drain the infrarenal IVC flow via collateral network. 50% stenosis of the infrarenal IVC secondary to chronic venous disease. Balloon angioplasty and stenting of left common iliac vein, restored patency after placement of 14 mm diameter dedicated venous stent. Operational tunneled catheter at the conclusion. We elected to place a 28 cm tip to cuff catheter. A 33 cm tip to cath catheter would also be reasonable. IMPRESSION: Status post left lower extremity venogram, angioplasty and stenting of iliac venous occlusion, angioplasty of infrarenal IVC stenosis, and replacement of left femoral vein tunneled hemodialysis catheter with a new 28 cm tip to cuff catheter. Signed, Yvone Neu. Miachel Roux, RPVI [facility] Radiology Electronically Signed   By: Gilmer Mor D.O.   On: 02/02/2023 13:03   Medications:  sodium chloride Stopped (01/30/23 2358)    ceFAZolin (ANCEF) IV 1 g (02/03/23 2241)   vancomycin 1,000 mg (02/03/23 1545)    (feeding supplement) PROSource Plus  30 mL Oral BID BM   apixaban  5 mg Oral BID   Chlorhexidine Gluconate Cloth  6 each  Topical Daily   Chlorhexidine Gluconate Cloth  6 each Topical Q0600   Chlorhexidine Gluconate Cloth  6 each Topical Q0600   darbepoetin (ARANESP) injection - DIALYSIS  150 mcg Subcutaneous Q Thu-1800   ferric citrate  630 mg Oral TID WC   metroNIDAZOLE  500 mg Oral Q12H   midodrine  15 mg Oral TID WC   sertraline  25 mg Oral Daily    Dialysis Orders: TTS at [facility] 4.5hr, 500/500, EDW 137kg, 2K/2Ca bath, TDC L femoral, heparin 5000 initial + 2500 mid run bolus - Parsabiv 7.5mg  IV q HD - Mircera IV q 2 weeks - last 8/6   Assessment/Plan: Proteus bacteremia: Blood Cx 8/20 positive (1 of 2), repeat 8/22 negative. Source felt either [facility] or foot wound. Old TDC removed and new one replaced by IR 8/22 but was non-functional. ID following - on Cefazolin, Vancomycin, Metronidazole - course unclear at this time. Dialysis access failure: Running out of options unfortunately, has been on HD ~20 years. Known central venous disease/SVC stenosis. Appreciate IR assistance - s/p revascularization to L iliac vein and new TDC placed 8/26 which is working well. Long-term plan is for HeRO graft once done with antibiotic course. Chronic R foot wound: Osteomyelitis s/p R toe  amputation 7/5, not healing. S/p foot debridement 8/25. ESRD: Usual TTS schedule - line issues as above, now back on track. Next HD 8/29. Hypotension/volume: Chronically low BP on midodrine 15mg  TID - volume up, UF as tolerated. Anemia of ESRD: Hgb 8.2 - stable, continue Aranesp 150 mg q Thursday while here. Secondary HTPH: CorrCa ok, Phos high but some improvement - continue Auryxia as binder. Parsabiv not formulary here. Nutrition: Alb 2.9 - continue supps. pAF: Eliquis has been resumed. OSA Adjustment disorder: Mood low/depressed in setting of line issues with HD. Follow closely. Sertraline started this admit. Dispo: Ok from renal standpoint - needs abx course defined.    Ozzie Hoyle, PA-C 02/04/2023, 10:09 AM   [facility]   Seen and examined independently this AM.  Agree with note and exam as documented above by physician extender and as noted here.  General adult male in bed in no acute distress, obese body habitus HEENT normocephalic atraumatic extraocular movements intact sclera anicteric Neck supple trachea midline Lungs clear to auscultation bilaterally normal work of breathing at rest  Heart S1S2 no rub Abdomen soft nontender nondistended Extremities trace edema  Psych frustrated but conversant  Neuro alert and oriented x 3 provides hx and follows commands  Access left thigh catheter in place  ESRD -  - HD per TTS schedule  - patient is s/p Exchange of tunneled HD catheter (revascularization of occluded left iliac vein, with PTA and venous stenting for restoration of flow. PTA of IVC stenosis, infrarenal).  Catheter has been working well   Proteus bacteremia - abx per ID   Chronic right foot wound - vac in place. S/p foot debridement 8/25.   Disposition per primary team   Estanislado Emms, MD 02/04/2023  3:01 PM

## 2023-02-04 NOTE — Evaluation (Addendum)
Occupational Therapy Evaluation Patient Details Name: [name] MRN: [id number] DOB: March 06, 1976 Today's Date: 02/04/2023   History of Present Illness 47 y.o. male presented 12/06/22 with worsening of right foot pain, ulcer drainage and fever. Foot cellulitis; MRI rt foot negative for osteomyelitis. S/p I&D RLE on 7/1. 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   Clinical Impression   Pt in good spirits, motivated to participate and return home. Pt c/o no pain, no increased pain with mobility. Pt lives alone in mobile home, 3 stairs to enter, has friend who can assist occasionally if necessary. Pt has RLE NWB restrictions but has been ambulating on heel/lateral foot to restroom without AD for 3 days, states no one told him otherwise. Pt educated on NWB and use of RW and importance of following restrictions, states it gets in his way in small room. Pt is able to use RW with TTWB but does not follow through with NWB.  Pt displays good overall strength, ROM, balance, independent-mod I with ADLs/mobility. Pt states he has all DME needed to remain safe and independent at home. No follow up or continued OT necessary.      If plan is discharge home, recommend the following: Assist for transportation    Functional Status Assessment     Equipment Recommendations  None recommended by OT    Recommendations for Other Services       Precautions / Restrictions Restrictions Weight Bearing Restrictions: Yes RLE Weight Bearing: Non weight bearing      Mobility Bed Mobility Overal bed mobility: Modified Independent                  Transfers Overall transfer level: Modified independent Equipment used: None               General transfer comment: Pt does not maintain NWB precautions, puts  pressure on heel/lateral foot, no pain, no LOB, states RW gets in his way with small room.      Balance Overall balance assessment: Modified Independent                                         ADL either performed or assessed with clinical judgement   ADL Overall ADL's : Modified independent                                       General ADL Comments: good balance, strength, ROM, able to perform IADLs. Pt does not maintain NWB status, has been getting up to ambulate to restroom unassisted, no LOB, puts pressure on heel/lateral foot, no pain. Instructed on use of RW and NWB precautions     Vision Baseline Vision/History: 1 Wears glasses Ability to See in Adequate Light: 0 Adequate Patient Visual Report: No change from baseline       Perception         Praxis         Pertinent Vitals/Pain Pain Assessment Pain Assessment: No/denies pain     Extremity/Trunk Assessment Upper Extremity Assessment Upper Extremity Assessment: Overall WFL for tasks assessed   Lower Extremity Assessment Lower Extremity Assessment: Defer to PT evaluation  Communication Communication Communication: No apparent difficulties   Cognition Arousal: Alert Behavior During Therapy: WFL for tasks assessed/performed Overall Cognitive Status: Within Functional Limits for tasks assessed                                       General Comments       Exercises     Shoulder Instructions      Home Living Family/patient expects to be discharged to:: Private residence Living Arrangements: Alone Available Help at Discharge: Family;Friend(s);Available 24 hours/day Type of Home: Mobile home Home Access: Stairs to enter Entrance Stairs-Number of Steps: 3 Entrance Stairs-Rails: Left Home Layout: One level     Bathroom Shower/Tub: Chief Strategy Officer: Standard     Home Equipment: Agricultural consultant (2 wheels)   Additional Comments:  Pt lives alone, has a friend who can assist occassionally      Prior Functioning/Environment Prior Level of Function : Independent/Modified Independent                        OT Problem List:        OT Treatment/Interventions:      OT Goals(Current goals can be found in the care plan section)    OT Frequency: Min 1X/week    Co-evaluation              AM-PAC OT "6 Clicks" Daily Activity     Outcome Measure Help from another person eating meals?: None Help from another person taking care of personal grooming?: None Help from another person toileting, which includes using toliet, bedpan, or urinal?: None Help from another person bathing (including washing, rinsing, drying)?: None Help from another person to put on and taking off regular upper body clothing?: None Help from another person to put on and taking off regular lower body clothing?: None 6 Click Score: 24   End of Session Nurse Communication: Mobility status  Activity Tolerance: Patient tolerated treatment well Patient left: in bed;with call bell/phone within reach  OT Visit Diagnosis: Other abnormalities of gait and mobility (R26.89)                Time: 1610-9604 OT Time Calculation (min): 17 min Charges:  OT General Charges $OT Visit: 1 Visit OT Evaluation $OT Eval Low Complexity: 1 Low  [address] OTR/L   [name] 02/04/2023, 9:16 AM

## 2023-02-04 NOTE — Progress Notes (Signed)
  Subjective:  Patient ID: [name], male    DOB: 1975-10-31,  MRN: [id number]  A 47 y.o. male presents  with right foot infection status post right foot incision and drainage washout with application of skin graft with wound VAC.  Patient wound VAC was changed today.  No complication.  Pain controlled well Objective:   Vitals:   02/04/23 0425 02/04/23 0842  BP: (!) 110/59 130/79  Pulse: 82 90  Resp: 16 17  Temp: 97.6 F (36.4 C) 97.7 F (36.5 C)  SpO2: 97% 100%   General AA&O x3. Normal mood and affect.  Vascular Dorsalis pedis and posterior tibial pulses 2/4 bilat. Brisk capillary refill to all digits. Pedal hair present.  Neurologic Epicritic sensation grossly intact.  Dermatologic Bandages clean dry and intact.  Motor or sensory functions are intact.  No calf pain.  Orthopedic: MMT 5/5 in dorsiflexion, plantarflexion, inversion, and eversion. Normal joint ROM without pain or crepitus.    Assessment & Plan:  Patient was evaluated and treated and all questions answered.  Right foot infection status post right foot incision drainage washout debridement with application of skin graft and wound VAC -All questions and concerns were discussed with the patient extensive detail -Patient is okay to be discharged from podiatric standpoint.  Antibiotics per culture. -Nonweightbearing to the right lower extremity -He will need home health for wound care and VAC changes -Patient will follow-up with podiatry 1 week from discharge.  Monday Wednesday Friday VAC changes by nursing.  Candelaria Stagers, DPM  Accessible via secure chat for questions or concerns.

## 2023-02-04 NOTE — Progress Notes (Signed)
PROGRESS NOTE  [name] Surgeon  QMV:[id number] DOB: [birth year] DOA: 01/27/2023 PCP: [name], MD   Brief Narrative: Patient is a 47 year old male with history of ESRD, failed renal transplant, right lower extremity osteomyelitis status post first ray transmetatarsal amputation who was brought from outpatient dialysis center for further evaluation of hypotension.  He was recently treated with prolonged course of antibiotics for right lower extremity osteomyelitis .patient was also having thick drainage from his left femoral tunneled catheter for 3 days.  Patient was suspected to have septic shock and admitted under PCCM service, started on pressors.  Blood cultures showed proteus.  ID, nephrology, vascular surgery following.  Patient transferred to [facility] service on 8/22.  Nephrology, ID following.    Assessment & Plan:  Active Problems:   Paroxysmal atrial fibrillation (HCC)   Septic shock (HCC)   Chronic anticoagulation   Gram-negative bacteremia   ESRD on hemodialysis (HCC)   Non-healing wound of lower extremity   Chronic osteomyelitis of right foot with draining sinus (HCC)   Septic shock/gram-negative bacteremia/HD line infection: Presented with hypotension.  Patient was also having thick drainage from his left femoral tunneled catheter for 3 days.  Patient was suspected to have septic shock and admitted under PCCM service, started on pressors.  Blood cultures showed Proteus.  Currently on ceftriaxone, Vanco, Flagyl to cover for both bacteremia and osteomyelitis.Sepsis physiology has resolved, lactic acidosis has resolved .currently blood pressure stable.  Patient has chronic hypotension and is on midodrine.  ESRD: Nephrology following.  Concern for HD line infection.  Patient had access in the left femoral area.Vascular surgery was following,he was  planned for graft placement in few weeks.   Vascular surgery discussed with IR.Underwent  fluoroscopy guided exchange of left femoral  hemodialysis catheter by IR on 8/22.status post left lower extremity venogram, revascularization of occluded left iliac vein with PTA, venous stenting, exchange of tunneled HD catheter on 8/26 by IR.  Hemodialysis catheter now working.Long-term plan is for HeRO graft once done with antibiotic course.  We will continue phosphate binders.  Right lower extremity osteomyelitis: Status post right first ray amputation.  MRI showed changes of osteomyelitis.   .ID following.  S/p irrigation and debridement of right foot, allograft application, wound VAC application on 8/25.  Wound cultures showing rare staph aureus.ID waiting for culture to mature  before recommending antibiotics.  Will follow-up with ID regarding final recommendation on choice of antibiotics and duration.  Patient should follow up with podiatry as an outpatient, will be discharged on wound VAC when ready  Hyponatremia: Likely from volume overload.  Volume management as per dialysis  Anemia of chronic disease: Continue to monitor hemoglobin.  Transfuse if hemoglobin drops less than 7  Paroxysmal A-fib: Currently in normal sinus rhythm.  Monitor on telemetry.  Currently Eliquis is on hold. He was On heparin drip now resumed eliquis  anticipating no further operative intervention  Hyperglycemia: A1c of 5.9 as per 6/24.  Monitor blood sugars  Morbid obesity: BMI 40  Depressed mood: Patient noted to be depressed, apathetic due to his current medical conditions.  Started on low-dose Zoloft but patient says he is not depressed and does not want any medication.  Zoloft discontinued       DVT prophylaxis:iv heparin apixaban (ELIQUIS) tablet 5 mg     Code Status: Full Code  Family Communication: called and discussed with mother Darl Pikes on phone on 8/23  Patient status:Inpatient  Patient is from :home  Anticipated discharge MW:UXLK  Estimated  DC date: After clearance from ID, nephrology   Consultants: ID, PCCM, nephrology, vascular  surgery  Procedures: Dialysis, exchange of dialysis catheter  Antimicrobials:  Anti-infectives (From admission, onward)    Start     Dose/Rate Route Frequency Ordered Stop   02/03/23 1200  vancomycin (VANCOCIN) IVPB 1000 mg/200 mL premix        1,000 mg 200 mL/hr over 60 Minutes Intravenous Every T-Th-Sa (Hemodialysis) 02/03/23 0953     02/02/23 1500  vancomycin (VANCOCIN) IVPB 1000 mg/200 mL premix        1,000 mg 200 mL/hr over 60 Minutes Intravenous Once in dialysis 02/02/23 1444 02/02/23 1651   02/02/23 0947  ceFAZolin (ANCEF) IVPB 2g/100 mL premix        over 30 Minutes Intravenous Continuous PRN 02/02/23 0947 02/02/23 0947   02/01/23 1505  vancomycin (VANCOCIN) powder  Status:  Discontinued          As needed 02/01/23 1505 02/01/23 1520   02/01/23 1505  gentamicin (GARAMYCIN) injection  Status:  Discontinued          As needed 02/01/23 1505 02/01/23 1520   01/30/23 2200  ceFAZolin (ANCEF) IVPB 1 g/50 mL premix        1 g 100 mL/hr over 30 Minutes Intravenous Every 24 hours 01/30/23 1413     01/29/23 2200  metroNIDAZOLE (FLAGYL) tablet 500 mg        500 mg Oral Every 12 hours 01/29/23 1509     01/29/23 2200  cefTRIAXone (ROCEPHIN) 2 g in sodium chloride 0.9 % 100 mL IVPB  Status:  Discontinued        2 g 200 mL/hr over 30 Minutes Intravenous Every 24 hours 01/29/23 1548 01/30/23 1413   01/29/23 1549  vancomycin variable dose per unstable renal function (pharmacist dosing)  Status:  Discontinued         Does not apply See admin instructions 01/29/23 1549 02/03/23 0953   01/29/23 0600  ceFEPIme (MAXIPIME) 1 g in sodium chloride 0.9 % 100 mL IVPB  Status:  Discontinued        1 g 200 mL/hr over 30 Minutes Intravenous Every 24 hours 01/28/23 1006 01/29/23 1548   01/28/23 1600  ceFEPIme (MAXIPIME) 1 g in sodium chloride 0.9 % 100 mL IVPB  Status:  Discontinued        1 g 200 mL/hr over 30 Minutes Intravenous Every 24 hours 01/27/23 1715 01/27/23 1759   01/28/23 0400  ceFEPIme  (MAXIPIME) 2 g in sodium chloride 0.9 % 100 mL IVPB  Status:  Discontinued        2 g 200 mL/hr over 30 Minutes Intravenous Every 12 hours 01/27/23 1759 01/28/23 1006   01/27/23 1811  vancomycin variable dose per unstable renal function (pharmacist dosing)  Status:  Discontinued         Does not apply See admin instructions 01/27/23 1811 01/28/23 0959   01/27/23 1445  metroNIDAZOLE (FLAGYL) IVPB 500 mg  Status:  Discontinued        500 mg 100 mL/hr over 60 Minutes Intravenous Every 12 hours 01/27/23 1434 01/28/23 1010   01/27/23 1445  ceFEPIme (MAXIPIME) 1 g in sodium chloride 0.9 % 100 mL IVPB        1 g 200 mL/hr over 30 Minutes Intravenous  Once 01/27/23 1440 01/27/23 1618   01/27/23 1115  cefTRIAXone (ROCEPHIN) 2 g in sodium chloride 0.9 % 100 mL IVPB        2 g 200  mL/hr over 30 Minutes Intravenous  Once 01/27/23 1101 01/27/23 1212   01/27/23 1115  vancomycin (VANCOCIN) 2,500 mg in sodium chloride 0.9 % 500 mL IVPB        2,500 mg 262.5 mL/hr over 120 Minutes Intravenous  Once 01/27/23 1108 01/27/23 1534       Subjective: Patient seen and examined at bedside today.  Hemodynamically stable.  Comfortably sitting on the chair.  Denies any pain or discomfort.  He denies any depressed mood and says he just frustrated because he is in the hospital.  Patient participated with the physical therapist and did well, no follow-up recommended  Objective: Vitals:   02/03/23 1423 02/03/23 2032 02/04/23 0425 02/04/23 0842  BP: 113/85 113/77 (!) 110/59 130/79  Pulse: 79 76 82 90  Resp: 20 16 16 17   Temp: 98.4 F (36.9 C) 98.2 F (36.8 C) 97.6 F (36.4 C) 97.7 F (36.5 C)  TempSrc:      SpO2: 100% 99% 97% 100%  Weight: (!) 146.1 kg     Height:        Intake/Output Summary (Last 24 hours) at 02/04/2023 1248 Last data filed at 02/04/2023 0900 Gross per 24 hour  Intake 490 ml  Output 2000 ml  Net -1510 ml   Filed Weights   02/02/23 0515 02/03/23 0703 02/03/23 1423  Weight: (!) 144.3  kg (!) 148.1 kg (!) 146.1 kg    Examination:   General exam: Overall comfortable, not in distress, morbidly obese HEENT: PERRL Respiratory system:  no wheezes or crackles  Cardiovascular system: S1 & S2 heard, RRR.  Gastrointestinal system: Abdomen is nondistended, soft and nontender. Central nervous system: Alert and oriented Extremities: Femoral dialysis catheter, right foot dressing with wound VAC Skin: No rashes, no ulcers,no icterus       Data Reviewed: I have personally reviewed following labs and imaging studies  CBC: Recent Labs  Lab 01/29/23 0833 01/30/23 0505 01/31/23 0818 02/01/23 0949 02/02/23 0810  WBC 9.4 11.1* 8.9 8.4 7.7  HGB 8.0* 8.2* 7.9* 8.3* 8.2*  HCT 24.9* 25.5* 24.4* 25.5* 24.9*  MCV 86.2 84.7 84.4 86.1 85.6  PLT 271 303 306 316 328   Basic Metabolic Panel: Recent Labs  Lab 02/01/23 0949 02/02/23 0810 02/03/23 0953 02/03/23 1905 02/04/23 0438  NA 131* 132* 133* 132* 131*  K 5.2* 5.8* 4.5 3.7 4.0  CL 93* 96* 95* 93* 95*  CO2 14* 14* 23 24 22   GLUCOSE 80 74 103* 94 81  BUN 116* 123* 68* 42* 48*  CREATININE 22.65* 24.66* 16.15* 11.62* 12.85*  CALCIUM 8.5* 8.6* 8.2* 8.6* 8.6*  PHOS 9.2* 10.6* 8.4* 5.6* 6.5*     Recent Results (from the past 240 hour(s))  Culture, blood (Routine x 2)     Status: Abnormal   Collection Time: 01/27/23 11:00 AM   Specimen: BLOOD  Result Value Ref Range Status   Specimen Description BLOOD RIGHT ANTECUBITAL  Final   Special Requests   Final    BOTTLES DRAWN AEROBIC AND ANAEROBIC Blood Culture adequate volume   Culture  Setup Time   Final    GRAM NEGATIVE RODS IN BOTH AEROBIC AND ANAEROBIC BOTTLES CRITICAL RESULT CALLED TO, READ BACK BY AND VERIFIED WITH: V BRYK,PHARMD@[id number]  01/28/23 MK Performed at [facility], [address] [place], [place] [address]    Culture PROTEUS MIRABILIS (A)  Final   Report Status 01/30/2023 FINAL  Final   Organism ID, Bacteria PROTEUS MIRABILIS  Final  Susceptibility   Proteus mirabilis - MIC*    AMPICILLIN <=2 SENSITIVE Sensitive     CEFEPIME <=0.12 SENSITIVE Sensitive     CEFTAZIDIME <=1 SENSITIVE Sensitive     CEFTRIAXONE <=0.25 SENSITIVE Sensitive     CIPROFLOXACIN <=0.25 SENSITIVE Sensitive     GENTAMICIN <=1 SENSITIVE Sensitive     IMIPENEM 2 SENSITIVE Sensitive     TRIMETH/SULFA <=20 SENSITIVE Sensitive     AMPICILLIN/SULBACTAM <=2 SENSITIVE Sensitive     PIP/TAZO <=4 SENSITIVE Sensitive     * PROTEUS MIRABILIS  Blood Culture ID Panel (Reflexed)     Status: Abnormal   Collection Time: 01/27/23 11:00 AM  Result Value Ref Range Status   Enterococcus faecalis NOT DETECTED NOT DETECTED Final   Enterococcus Faecium NOT DETECTED NOT DETECTED Final   Listeria monocytogenes NOT DETECTED NOT DETECTED Final   Staphylococcus species NOT DETECTED NOT DETECTED Final   Staphylococcus aureus (BCID) NOT DETECTED NOT DETECTED Final   Staphylococcus epidermidis NOT DETECTED NOT DETECTED Final   Staphylococcus lugdunensis NOT DETECTED NOT DETECTED Final   Streptococcus species NOT DETECTED NOT DETECTED Final   Streptococcus agalactiae NOT DETECTED NOT DETECTED Final   Streptococcus pneumoniae NOT DETECTED NOT DETECTED Final   Streptococcus pyogenes NOT DETECTED NOT DETECTED Final   A.calcoaceticus-baumannii NOT DETECTED NOT DETECTED Final   Bacteroides fragilis NOT DETECTED NOT DETECTED Final   Enterobacterales DETECTED (A) NOT DETECTED Final    Comment: Enterobacterales represent a large order of gram negative bacteria, not a single organism. CRITICAL RESULT CALLED TO, READ BACK BY AND VERIFIED WITH: V BRYK,PHARMD@[id number]  01/28/23 MK    Enterobacter cloacae complex NOT DETECTED NOT DETECTED Final   Escherichia coli NOT DETECTED NOT DETECTED Final   Klebsiella aerogenes NOT DETECTED NOT DETECTED Final   Klebsiella oxytoca NOT DETECTED NOT DETECTED Final   Klebsiella pneumoniae NOT DETECTED NOT DETECTED Final   Proteus species DETECTED (A)  NOT DETECTED Final    Comment: CRITICAL RESULT CALLED TO, READ BACK BY AND VERIFIED WITH: V BRYK,PHARMD@[id number]  01/28/23 MK    Salmonella species NOT DETECTED NOT DETECTED Final   Serratia marcescens NOT DETECTED NOT DETECTED Final   Haemophilus influenzae NOT DETECTED NOT DETECTED Final   Neisseria meningitidis NOT DETECTED NOT DETECTED Final   Pseudomonas aeruginosa NOT DETECTED NOT DETECTED Final   Stenotrophomonas maltophilia NOT DETECTED NOT DETECTED Final   Candida albicans NOT DETECTED NOT DETECTED Final   Candida auris NOT DETECTED NOT DETECTED Final   Candida glabrata NOT DETECTED NOT DETECTED Final   Candida krusei NOT DETECTED NOT DETECTED Final   Candida parapsilosis NOT DETECTED NOT DETECTED Final   Candida tropicalis NOT DETECTED NOT DETECTED Final   Cryptococcus neoformans/gattii NOT DETECTED NOT DETECTED Final   CTX-M ESBL NOT DETECTED NOT DETECTED Final   Carbapenem resistance IMP NOT DETECTED NOT DETECTED Final   Carbapenem resistance KPC NOT DETECTED NOT DETECTED Final   Carbapenem resistance NDM NOT DETECTED NOT DETECTED Final   Carbapenem resist OXA 48 LIKE NOT DETECTED NOT DETECTED Final   Carbapenem resistance VIM NOT DETECTED NOT DETECTED Final    Comment: Performed at [facility], [address], [place], [place] [address]  SARS Coronavirus 2 by RT PCR (hospital order, performed in [facility] hospital lab) *cepheid single result test* Anterior Nasal Swab     Status: None   Collection Time: 01/27/23 11:42 AM   Specimen: Anterior Nasal Swab  Result Value Ref Range Status   SARS Coronavirus 2 by RT PCR  NEGATIVE NEGATIVE Final    Comment: Performed at [facility], [address] [place], [place] [address]  Culture, blood (Routine x 2)     Status: None   Collection Time: 01/27/23 11:50 AM   Specimen: BLOOD RIGHT HAND  Result Value Ref Range Status   Specimen Description BLOOD RIGHT HAND  Final   Special Requests   Final    BOTTLES DRAWN AEROBIC AND  ANAEROBIC Blood Culture adequate volume   Culture   Final    NO GROWTH 5 DAYS Performed at [facility], [address] [place], [place] [address]    Report Status 02/01/2023 FINAL  Final  MRSA Next Gen by PCR, Nasal     Status: None   Collection Time: 01/27/23  3:09 PM   Specimen: Nasal Mucosa; Nasal Swab  Result Value Ref Range Status   MRSA by PCR Next Gen NOT DETECTED NOT DETECTED Final    Comment: (NOTE) The GeneXpert MRSA Assay (FDA approved for NASAL specimens only), is one component of a comprehensive MRSA colonization surveillance program. It is not intended to diagnose MRSA infection nor to guide or monitor treatment for MRSA infections. Test performance is not FDA approved in patients less than 37 years old. Performed at [facility], [address], [place], [place] [address]   Culture, blood (Routine X 2) w Reflex to ID Panel     Status: None   Collection Time: 01/29/23  8:33 AM   Specimen: BLOOD LEFT ARM  Result Value Ref Range Status   Specimen Description BLOOD LEFT ARM  Final   Special Requests   Final    BOTTLES DRAWN AEROBIC ONLY Blood Culture adequate volume   Culture   Final    NO GROWTH 6 DAYS Performed at [facility], [address], [place], [place] [address]    Report Status 02/04/2023 FINAL  Final  Culture, blood (Routine X 2) w Reflex to ID Panel     Status: None   Collection Time: 01/29/23  8:35 AM   Specimen: BLOOD RIGHT HAND  Result Value Ref Range Status   Specimen Description BLOOD RIGHT HAND  Final   Special Requests   Final    BOTTLES DRAWN AEROBIC ONLY Blood Culture adequate volume   Culture   Final    NO GROWTH 6 DAYS Performed at [facility], [address], [place], [place] [address]    Report Status 02/04/2023 FINAL  Final  Aerobic/Anaerobic Culture w Gram Stain (surgical/deep wound)     Status: None (Preliminary result)   Collection Time: 02/01/23  3:07 PM   Specimen: Foot, Right; Tissue  Result Value  Ref Range Status   Specimen Description TISSUE  Final   Special Requests right metatarsal bone  Final   Gram Stain   Final    RARE GRAM POSITIVE COCCI NO WBC SEEN Performed at [facility], [address], [place], [place] [address]    Culture   Final    RARE STAPHYLOCOCCUS AUREUS NO ANAEROBES ISOLATED; CULTURE IN PROGRESS FOR 5 DAYS    Report Status PENDING  Incomplete  Aerobic/Anaerobic Culture w Gram Stain (surgical/deep wound)     Status: None (Preliminary result)   Collection Time: 02/01/23  3:08 PM   Specimen: Path fluid; Amputation  Result Value Ref Range Status   Specimen Description WOUND  Final   Special Requests right foot  Final   Gram Stain   Final  RARE WBC PRESENT, PREDOMINANTLY PMN NO ORGANISMS SEEN Performed at [facility], [address], [place], [place] [address]    Culture   Final    RARE STAPHYLOCOCCUS AUREUS SUSCEPTIBILITIES TO FOLLOW NO ANAEROBES ISOLATED; CULTURE IN PROGRESS FOR 5 DAYS    Report Status PENDING  Incomplete     Radiology Studies: No results found.  Scheduled Meds:  (feeding supplement) PROSource Plus  30 mL Oral BID BM   apixaban  5 mg Oral BID   Chlorhexidine Gluconate Cloth  6 each Topical Q0600   darbepoetin (ARANESP) injection - DIALYSIS  150 mcg Subcutaneous Q Thu-1800   ferric citrate  630 mg Oral TID WC   metroNIDAZOLE  500 mg Oral Q12H   midodrine  15 mg Oral TID WC   sertraline  25 mg Oral Daily   Continuous Infusions:  sodium chloride Stopped (01/30/23 2358)    ceFAZolin (ANCEF) IV 1 g (02/03/23 2241)   vancomycin 1,000 mg (02/03/23 1545)     LOS: 8 days   Burnadette Pop, MD [facility] P8/28/2024, 12:48 PM

## 2023-02-04 NOTE — Evaluation (Signed)
Physical Therapy Evaluation Patient Details Name: [name] MRN: [id number] DOB: 1975-12-18 Today's Date: 02/04/2023  History of Present Illness  47 y.o. male presents to [facility] hospital on 01/27/2023 with septic shock related to recurrent osteomyelitis of R foot. I&D of R foot on 8/25. PMH includes ESRD, chronic RLE osteomyelitis, PAF, chronic hypotension.  Clinical Impression  Pt presents to PT with deficits in knowledge of WB precautions. Pt reports he was not told he was to be non-weightbearing after surgery and has been ambulating around the room with heel weightbearing since surgery. Pt is not agreeable to mobility training with NWB precautions, and he does not want to utilize any DME to offload RLE. Pt is able to ambulate for short distances in the room with full heel weightbearing through R foot. PT limits distances as pt is not adherent to NWB precautions. Pt will follow in an effort to encourage NWB, however pt appears likely to continue to not follow precautions at this time. If pt becomes agreeable to mobility training while NWB he may benefit from use of a wheelchair or knee scooter.        If plan is discharge home, recommend the following: A little help with walking and/or transfers;A little help with bathing/dressing/bathroom;Assistance with cooking/housework;Assist for transportation;Help with stairs or ramp for entrance   Can travel by private vehicle        Equipment Recommendations BSC/3in1;Wheelchair (measurements PT) (pt is declining DME, would benefit from a wheelchair to maintain WB precautions if agreeable)  Recommendations for Other Services       Functional Status Assessment Patient has had a recent decline in their functional status and demonstrates the ability to make significant improvements in function in a reasonable and predictable amount of time.     Precautions / Restrictions Precautions Precautions: Fall Precaution Comments: wound  vac Restrictions Weight Bearing Restrictions: Yes RLE Weight Bearing: Non weight bearing Other Position/Activity Restrictions: pt is not agreeable to maintaining WB precautions and declines use of all DME despite education and encouragement from PT. He does attempt to maintain heel WB      Mobility  Bed Mobility Overal bed mobility: Independent                  Transfers Overall transfer level: Independent                      Ambulation/Gait Ambulation/Gait assistance: Modified independent (Device/Increase time) Gait Distance (Feet): 40 Feet Assistive device: None (pt refuses use of DME) Gait Pattern/deviations: Step-to pattern Gait velocity: reduced Gait velocity interpretation: <1.31 ft/sec, indicative of household ambulator   General Gait Details: pt with slowed step-to gait, heel WB through RLE  Stairs            Wheelchair Mobility     Tilt Bed    Modified Rankin (Stroke Patients Only)       Balance Overall balance assessment: No apparent balance deficits (not formally assessed)                                           Pertinent Vitals/Pain Pain Assessment Pain Assessment: No/denies pain    Home Living Family/patient expects to be discharged to:: Private residence Living Arrangements: Alone Available Help at Discharge: Family;Friend(s);Available 24 hours/day Type of Home: Mobile home Home Access: Stairs to enter Entrance Stairs-Rails: Left Entrance Stairs-Number of Steps: 3  Home Layout: One level Home Equipment: Agricultural consultant (2 wheels);Cane - single point Additional Comments: Pt lives alone, has a friend who can assist occassionally    Prior Function Prior Level of Function : Independent/Modified Independent                     Extremity/Trunk Assessment   Upper Extremity Assessment Upper Extremity Assessment: Overall WFL for tasks assessed    Lower Extremity Assessment Lower Extremity  Assessment: Overall WFL for tasks assessed    Cervical / Trunk Assessment Cervical / Trunk Assessment: Normal  Communication   Communication Communication: No apparent difficulties Cueing Techniques: Verbal cues  Cognition Arousal: Alert Behavior During Therapy: WFL for tasks assessed/performed Overall Cognitive Status: Impaired/Different from baseline Area of Impairment: Awareness                           Awareness: Emergent (poor awareness of impact of not following weightbearing precautions)            General Comments General comments (skin integrity, edema, etc.): VSS on RA    Exercises     Assessment/Plan    PT Assessment Patient needs continued PT services  PT Problem List Decreased activity tolerance;Decreased mobility;Decreased balance;Decreased safety awareness;Decreased knowledge of precautions       PT Treatment Interventions DME instruction;Gait training;Stair training;Functional mobility training;Balance training;Wheelchair mobility training;Patient/family education    PT Goals (Current goals can be found in the Care Plan section)  Acute Rehab PT Goals Patient Stated Goal: to go home PT Goal Formulation: With patient Time For Goal Achievement: 02/18/23 Potential to Achieve Goals: Fair Additional Goals Additional Goal #1: Pt will transfer and ambulate while maintaining NWB through R foot to allow for wound healing.    Frequency Min 1X/week     Co-evaluation               AM-PAC PT "6 Clicks" Mobility  Outcome Measure Help needed turning from your back to your side while in a flat bed without using bedrails?: None Help needed moving from lying on your back to sitting on the side of a flat bed without using bedrails?: None Help needed moving to and from a bed to a chair (including a wheelchair)?: None Help needed standing up from a chair using your arms (e.g., wheelchair or bedside chair)?: None Help needed to walk in hospital  room?: None Help needed climbing 3-5 steps with a railing? : A Little 6 Click Score: 23    End of Session   Activity Tolerance: Patient tolerated treatment well Patient left: in bed;with call bell/phone within reach Nurse Communication: Mobility status PT Visit Diagnosis: Other abnormalities of gait and mobility (R26.89)    Time: 3235-5732 PT Time Calculation (min) (ACUTE ONLY): 9 min   Charges:   PT Evaluation $PT Eval Low Complexity: 1 Low   PT General Charges $$ ACUTE PT VISIT: 1 Visit         Arlyss Gandy, PT, DPT Acute Rehabilitation Office [phone]   Arlyss Gandy 02/04/2023, 11:05 AM

## 2023-02-05 ENCOUNTER — Other Ambulatory Visit (HOSPITAL_COMMUNITY): Payer: Self-pay

## 2023-02-05 DIAGNOSIS — M86171 Other acute osteomyelitis, right ankle and foot: Secondary | ICD-10-CM

## 2023-02-05 LAB — RENAL FUNCTION PANEL
Albumin: 3.1 g/dL — ABNORMAL LOW (ref 3.5–5.0)
Anion gap: 18 — ABNORMAL HIGH (ref 5–15)
BUN: 55 mg/dL — ABNORMAL HIGH (ref 6–20)
CO2: 20 mmol/L — ABNORMAL LOW (ref 22–32)
Calcium: 9.2 mg/dL (ref 8.9–10.3)
Chloride: 92 mmol/L — ABNORMAL LOW (ref 98–111)
Creatinine, Ser: 14.85 mg/dL — ABNORMAL HIGH (ref 0.61–1.24)
GFR, Estimated: 4 mL/min — ABNORMAL LOW (ref >60)
Glucose, Bld: 81 mg/dL (ref 70–99)
Phosphorus: 7.6 mg/dL — ABNORMAL HIGH (ref 2.5–4.6)
Potassium: 3.9 mmol/L (ref 3.5–5.1)
Sodium: 130 mmol/L — ABNORMAL LOW (ref 135–145)

## 2023-02-05 LAB — CBC
HCT: 26.4 % — ABNORMAL LOW (ref 39.0–52.0)
Hemoglobin: 8.4 g/dL — ABNORMAL LOW (ref 13.0–17.0)
MCH: 27.4 pg (ref 26.0–34.0)
MCHC: 31.8 g/dL (ref 30.0–36.0)
MCV: 86 fL (ref 80.0–100.0)
Platelets: 360 10*3/uL (ref 150–400)
RBC: 3.07 MIL/uL — ABNORMAL LOW (ref 4.22–5.81)
RDW: 19.5 % — ABNORMAL HIGH (ref 11.5–15.5)
WBC: 7.3 10*3/uL (ref 4.0–10.5)
nRBC: 0 % (ref 0.0–0.2)

## 2023-02-05 MED ORDER — HEPARIN SODIUM (PORCINE) 1000 UNIT/ML DIALYSIS
1000.0000 [IU] | INTRAMUSCULAR | Status: DC | PRN
  Administered 2023-02-05: 4200 [IU]
  Filled 2023-02-05: qty 1

## 2023-02-05 MED ORDER — LIDOCAINE-PRILOCAINE 2.5-2.5 % EX CREA: 1.0000 | TOPICAL_CREAM | CUTANEOUS | Status: DC | PRN

## 2023-02-05 MED ORDER — CEFAZOLIN IV (FOR PTA / DISCHARGE USE ONLY): 2.0000 g | INTRAVENOUS | Status: AC

## 2023-02-05 MED ORDER — HEPARIN SODIUM (PORCINE) 1000 UNIT/ML DIALYSIS: 20.0000 [IU]/kg | INTRAMUSCULAR | Status: DC | PRN

## 2023-02-05 MED ORDER — ANTICOAGULANT SODIUM CITRATE 4% (200MG/5ML) IV SOLN: 5.0000 mL | Status: DC | PRN

## 2023-02-05 MED ORDER — METRONIDAZOLE 500 MG PO TABS
500.0000 mg | ORAL_TABLET | Freq: Two times a day (BID) | ORAL | 0 refills | Status: AC
  Filled 2023-02-05: qty 76, 38d supply, fill #0

## 2023-02-05 MED ORDER — DARBEPOETIN ALFA 200 MCG/0.4ML IJ SOSY
200.0000 ug | PREFILLED_SYRINGE | INTRAMUSCULAR | Status: DC
  Filled 2023-02-05: qty 0.4

## 2023-02-05 MED ORDER — LIDOCAINE HCL (PF) 1 % IJ SOLN: 5.0000 mL | INTRAMUSCULAR | Status: DC | PRN

## 2023-02-05 MED ORDER — ALTEPLASE 2 MG IJ SOLR: 2.0000 mg | Freq: Once | INTRAMUSCULAR | Status: DC | PRN

## 2023-02-05 MED ORDER — HEPARIN SODIUM (PORCINE) 1000 UNIT/ML DIALYSIS: 1000.0000 [IU] | INTRAMUSCULAR | Status: DC | PRN

## 2023-02-05 MED ORDER — PENTAFLUOROPROP-TETRAFLUOROETH EX AERO: 1.0000 | INHALATION_SPRAY | CUTANEOUS | Status: DC | PRN

## 2023-02-05 MED ORDER — VANCOMYCIN IV (FOR PTA / DISCHARGE USE ONLY): 1000.0000 mg | INTRAVENOUS | Status: AC

## 2023-02-05 MED ORDER — HEPARIN SODIUM (PORCINE) 1000 UNIT/ML DIALYSIS
5000.0000 [IU] | Freq: Once | INTRAMUSCULAR | Status: AC
  Administered 2023-02-05: 5000 [IU] via INTRAVENOUS_CENTRAL
  Filled 2023-02-05: qty 5

## 2023-02-05 NOTE — Progress Notes (Signed)
Received patient in bed to unit.  Alert and oriented.  Informed consent signed and in chart.   TX duration:4  Patient tolerated well.  Transported back to the room  Alert, without acute distress.  Hand-off given to patient's nurse.   Access used: cathter Access issues: n/a  Total UF removed: 4L Medication(s) given: Vanc Post HD weight: 140.0kg    02/05/23 1415  Vitals  Temp 98.2 F (36.8 C)  Temp Source Oral  BP 110/68  MAP (mmHg) 81  Pulse Rate 80  ECG Heart Rate 80  Resp 12  Oxygen Therapy  SpO2 100 %  O2 Device Room Air  During Treatment Monitoring  Blood Flow Rate (mL/min) 400 mL/min  Arterial Pressure (mmHg) -80.19 mmHg  Venous Pressure (mmHg) 197.59 mmHg  TMP (mmHg) 20 mmHg  Ultrafiltration Rate (mL/min) 1200 mL/min  Dialysate Flow Rate (mL/min) 300 ml/min  Dialysate Potassium Concentration 2  Dialysate Calcium Concentration 2.5  Duration of HD Treatment -hour(s) 4 hour(s)  Cumulative Fluid Removed (mL) per Treatment  5784.69  Intra-Hemodialysis Comments Tx completed  Post Treatment  Dialyzer Clearance Lightly streaked  Hemodialysis Intake (mL) 0 mL  Liters Processed 96  Fluid Removed (mL) 4000 mL  Tolerated HD Treatment Yes  Hemodialysis Catheter Left Femoral vein Double lumen Permanent (Tunneled)  Placement Date/Time: 02/02/23 1020   Placed prior to admission: No  Serial / Lot #: [id number]  Expiration Date: 12/29/26  Time Out: Correct patient;Correct site;Correct procedure  Maximum sterile barrier precautions: Hand hygiene;Cap;Mask;Sterile gow...  Site Condition No complications  Blue Lumen Status Heparin locked  Red Lumen Status Heparin locked  Catheter fill solution Heparin 1000 units/ml  Catheter fill volume (Arterial) 2.1 cc  Catheter fill volume (Venous) 2.1  Dressing Type Transparent  Dressing Status Antimicrobial disc in place  Dressing Change Due 02/10/23  Post treatment catheter status Capped and Clamped      [facility]

## 2023-02-05 NOTE — Progress Notes (Signed)
D/C order noted. Contacted [facility] to confirm that clinic has needed iv abx to provide with HD. Clinic has both medications available. Renal PA to send orders to clinic for iv abx at d/c. Clinic advised pt will d/c today and resume on Saturday.   Olivia Canter Renal Navigator [phone]

## 2023-02-05 NOTE — Progress Notes (Signed)
[facility]  Date of Admission:  01/27/2023     Total days of antibiotics 10         ASSESSMENT:  Mr. Dimitri surgical specimens from 8/25 have shown MRSA and will need to continue Vancomycin with dialysis and oral metronidazole for 6 weeks from his surgical date which would place end date 03/15/23. For Proteus infection will treat for total of 2 weeks with Cefazolin with dialysis from the date of catheter exchange with end date 02/16/23. New catheter continues to function appropriately at this time. Will arrange follow up in ID clinic. Continue post-operative wound care of right foot per Podiatry with remaining medical and supportive care per Internal Medicine and Nephrology.   PLAN:  Continue vancomycin (with dialysis) and metronidazole (oral) for 6 weeks until 03/15/23.  Continue Cefazolin with dialysis for line infection through 02/16/23.  Post-operative right foot wound care per Podiatry.  Follow up in ID clinic.  Remaining medical and supportive care per Internal Medicine and Nephrology.   Active Problems:   Paroxysmal atrial fibrillation (HCC)   Septic shock (HCC)   Chronic anticoagulation   Gram-negative bacteremia   ESRD on hemodialysis (HCC)   Non-healing wound of lower extremity   Chronic osteomyelitis of right foot with draining sinus (HCC)    (feeding supplement) PROSource Plus  30 mL Oral BID BM   apixaban  5 mg Oral BID   Chlorhexidine Gluconate Cloth  6 each Topical Q0600   darbepoetin (ARANESP) injection - DIALYSIS  200 mcg Subcutaneous Q Thu-1800   ferric citrate  630 mg Oral TID WC   metroNIDAZOLE  500 mg Oral Q12H   midodrine  15 mg Oral TID WC    SUBJECTIVE:  Afebrile overnight with no acute events. Resting in bed.   No Known Allergies   Review of Systems: Review of Systems  Constitutional:  Negative for chills, fever and weight loss.  Respiratory:  Negative for cough, shortness of breath and wheezing.   Cardiovascular:   Negative for chest pain and leg swelling.  Gastrointestinal:  Negative for abdominal pain, constipation, diarrhea, nausea and vomiting.  Skin:  Negative for rash.      OBJECTIVE: Vitals:   02/05/23 1055 02/05/23 1100 02/05/23 1105 02/05/23 1130  BP: 117/86 117/86    Pulse: 65 62 77 72  Resp: 17 (!) 21 16 (!) 25  Temp:      TempSrc:      SpO2: 95% (!) 78% (!) 81% (!) 85%  Weight:      Height:       Body mass index is 40.59 kg/m.  Physical Exam Constitutional:      General: He is not in acute distress.    Appearance: He is well-developed. He is obese.  Cardiovascular:     Rate and Rhythm: Normal rate and regular rhythm.     Heart sounds: Normal heart sounds.  Pulmonary:     Effort: Pulmonary effort is normal.     Breath sounds: Normal breath sounds.  Skin:    General: Skin is warm and dry.  Neurological:     Mental Status: He is alert.     Lab Results Lab Results  Component Value Date   WBC 7.3 02/05/2023   HGB 8.4 (L) 02/05/2023   HCT 26.4 (L) 02/05/2023   MCV 86.0 02/05/2023   PLT 360 02/05/2023    Lab Results  Component Value Date   CREATININE 14.85 (H) 02/05/2023   BUN 55 (H)  02/05/2023   NA 130 (L) 02/05/2023   K 3.9 02/05/2023   CL 92 (L) 02/05/2023   CO2 20 (L) 02/05/2023    Lab Results  Component Value Date   ALT 46 (H) 01/28/2023   AST 34 01/28/2023   ALKPHOS 120 01/28/2023   BILITOT 1.2 01/28/2023     Microbiology: Recent Results (from the past 240 hour(s))  Culture, blood (Routine x 2)     Status: Abnormal   Collection Time: 01/27/23 11:00 AM   Specimen: BLOOD  Result Value Ref Range Status   Specimen Description BLOOD RIGHT ANTECUBITAL  Final   Special Requests   Final    BOTTLES DRAWN AEROBIC AND ANAEROBIC Blood Culture adequate volume   Culture  Setup Time   Final    GRAM NEGATIVE RODS IN BOTH AEROBIC AND ANAEROBIC BOTTLES CRITICAL RESULT CALLED TO, READ BACK BY AND VERIFIED WITH: V BRYK,PHARMD@[id number]  01/28/23 MK Performed at  [facility], [address], [place], [place] [address]    Culture PROTEUS MIRABILIS (A)  Final   Report Status 01/30/2023 FINAL  Final   Organism ID, Bacteria PROTEUS MIRABILIS  Final      Susceptibility   Proteus mirabilis - MIC*    AMPICILLIN <=2 SENSITIVE Sensitive     CEFEPIME <=0.12 SENSITIVE Sensitive     CEFTAZIDIME <=1 SENSITIVE Sensitive     CEFTRIAXONE <=0.25 SENSITIVE Sensitive     CIPROFLOXACIN <=0.25 SENSITIVE Sensitive     GENTAMICIN <=1 SENSITIVE Sensitive     IMIPENEM 2 SENSITIVE Sensitive     TRIMETH/SULFA <=20 SENSITIVE Sensitive     AMPICILLIN/SULBACTAM <=2 SENSITIVE Sensitive     PIP/TAZO <=4 SENSITIVE Sensitive     * PROTEUS MIRABILIS  Blood Culture ID Panel (Reflexed)     Status: Abnormal   Collection Time: 01/27/23 11:00 AM  Result Value Ref Range Status   Enterococcus faecalis NOT DETECTED NOT DETECTED Final   Enterococcus Faecium NOT DETECTED NOT DETECTED Final   Listeria monocytogenes NOT DETECTED NOT DETECTED Final   Staphylococcus species NOT DETECTED NOT DETECTED Final   Staphylococcus aureus (BCID) NOT DETECTED NOT DETECTED Final   Staphylococcus epidermidis NOT DETECTED NOT DETECTED Final   Staphylococcus lugdunensis NOT DETECTED NOT DETECTED Final   Streptococcus species NOT DETECTED NOT DETECTED Final   Streptococcus agalactiae NOT DETECTED NOT DETECTED Final   Streptococcus pneumoniae NOT DETECTED NOT DETECTED Final   Streptococcus pyogenes NOT DETECTED NOT DETECTED Final   A.calcoaceticus-baumannii NOT DETECTED NOT DETECTED Final   Bacteroides fragilis NOT DETECTED NOT DETECTED Final   Enterobacterales DETECTED (A) NOT DETECTED Final    Comment: Enterobacterales represent a large order of gram negative bacteria, not a single organism. CRITICAL RESULT CALLED TO, READ BACK BY AND VERIFIED WITH: V BRYK,PHARMD@[id number]  01/28/23 MK    Enterobacter cloacae complex NOT DETECTED NOT DETECTED Final   Escherichia coli NOT DETECTED NOT DETECTED  Final   Klebsiella aerogenes NOT DETECTED NOT DETECTED Final   Klebsiella oxytoca NOT DETECTED NOT DETECTED Final   Klebsiella pneumoniae NOT DETECTED NOT DETECTED Final   Proteus species DETECTED (A) NOT DETECTED Final    Comment: CRITICAL RESULT CALLED TO, READ BACK BY AND VERIFIED WITH: V BRYK,PHARMD@[id number]  01/28/23 MK    Salmonella species NOT DETECTED NOT DETECTED Final   Serratia marcescens NOT DETECTED NOT DETECTED Final   Haemophilus influenzae NOT DETECTED NOT DETECTED Final   Neisseria meningitidis NOT DETECTED NOT DETECTED Final   Pseudomonas aeruginosa NOT DETECTED NOT DETECTED Final  Stenotrophomonas maltophilia NOT DETECTED NOT DETECTED Final   Candida albicans NOT DETECTED NOT DETECTED Final   Candida auris NOT DETECTED NOT DETECTED Final   Candida glabrata NOT DETECTED NOT DETECTED Final   Candida krusei NOT DETECTED NOT DETECTED Final   Candida parapsilosis NOT DETECTED NOT DETECTED Final   Candida tropicalis NOT DETECTED NOT DETECTED Final   Cryptococcus neoformans/gattii NOT DETECTED NOT DETECTED Final   CTX-M ESBL NOT DETECTED NOT DETECTED Final   Carbapenem resistance IMP NOT DETECTED NOT DETECTED Final   Carbapenem resistance KPC NOT DETECTED NOT DETECTED Final   Carbapenem resistance NDM NOT DETECTED NOT DETECTED Final   Carbapenem resist OXA 48 LIKE NOT DETECTED NOT DETECTED Final   Carbapenem resistance VIM NOT DETECTED NOT DETECTED Final    Comment: Performed at [facility], [address] [place], [place] [address]  SARS Coronavirus 2 by RT PCR (hospital order, performed in [facility] hospital lab) *cepheid single result test* Anterior Nasal Swab     Status: None   Collection Time: 01/27/23 11:42 AM   Specimen: Anterior Nasal Swab  Result Value Ref Range Status   SARS Coronavirus 2 by RT PCR NEGATIVE NEGATIVE Final    Comment: Performed at [facility], [address] [place], [place] [address]  Culture, blood (Routine x 2)     Status: None    Collection Time: 01/27/23 11:50 AM   Specimen: BLOOD RIGHT HAND  Result Value Ref Range Status   Specimen Description BLOOD RIGHT HAND  Final   Special Requests   Final    BOTTLES DRAWN AEROBIC AND ANAEROBIC Blood Culture adequate volume   Culture   Final    NO GROWTH 5 DAYS Performed at [facility], [address], [place], [place] [address]    Report Status 02/01/2023 FINAL  Final  MRSA Next Gen by PCR, Nasal     Status: None   Collection Time: 01/27/23  3:09 PM   Specimen: Nasal Mucosa; Nasal Swab  Result Value Ref Range Status   MRSA by PCR Next Gen NOT DETECTED NOT DETECTED Final    Comment: (NOTE) The GeneXpert MRSA Assay (FDA approved for NASAL specimens only), is one component of a comprehensive MRSA colonization surveillance program. It is not intended to diagnose MRSA infection nor to guide or monitor treatment for MRSA infections. Test performance is not FDA approved in patients less than 60 years old. Performed at [facility], [address] [place], [place] [address]   Culture, blood (Routine X 2) w Reflex to ID Panel     Status: None   Collection Time: 01/29/23  8:33 AM   Specimen: BLOOD LEFT ARM  Result Value Ref Range Status   Specimen Description BLOOD LEFT ARM  Final   Special Requests   Final    BOTTLES DRAWN AEROBIC ONLY Blood Culture adequate volume   Culture   Final    NO GROWTH 6 DAYS Performed at [facility], [address], [place], [place] [address]    Report Status 02/04/2023 FINAL  Final  Culture, blood (Routine X 2) w Reflex to ID Panel     Status: None   Collection Time: 01/29/23  8:35 AM   Specimen: BLOOD RIGHT HAND  Result Value Ref Range Status   Specimen Description BLOOD RIGHT HAND  Final   Special Requests   Final    BOTTLES DRAWN AEROBIC ONLY Blood Culture adequate volume   Culture  Final    NO GROWTH 6 DAYS Performed at [facility], [address] [place], [place] [address]    Report Status  02/04/2023 FINAL  Final  Aerobic/Anaerobic Culture w Gram Stain (surgical/deep wound)     Status: None (Preliminary result)   Collection Time: 02/01/23  3:07 PM   Specimen: Foot, Right; Tissue  Result Value Ref Range Status   Specimen Description TISSUE  Final   Special Requests right metatarsal bone  Final   Gram Stain   Final    RARE GRAM POSITIVE COCCI NO WBC SEEN Performed at [facility], [address], [place], [place] [address]    Culture   Final    RARE STAPHYLOCOCCUS AUREUS SUSCEPTIBILITIES PERFORMED ON PREVIOUS CULTURE WITHIN THE LAST 5 DAYS. NO ANAEROBES ISOLATED; CULTURE IN PROGRESS FOR 5 DAYS    Report Status PENDING  Incomplete  Aerobic/Anaerobic Culture w Gram Stain (surgical/deep wound)     Status: None (Preliminary result)   Collection Time: 02/01/23  3:08 PM   Specimen: Path fluid; Amputation  Result Value Ref Range Status   Specimen Description WOUND  Final   Special Requests right foot  Final   Gram Stain   Final    RARE WBC PRESENT, PREDOMINANTLY PMN NO ORGANISMS SEEN Performed at [facility], [address], [place], [place] [address]    Culture   Final    RARE METHICILLIN RESISTANT STAPHYLOCOCCUS AUREUS NO ANAEROBES ISOLATED; CULTURE IN PROGRESS FOR 5 DAYS    Report Status PENDING  Incomplete   Organism ID, Bacteria METHICILLIN RESISTANT STAPHYLOCOCCUS AUREUS  Final      Susceptibility   Methicillin resistant staphylococcus aureus - MIC*    CIPROFLOXACIN <=0.5 SENSITIVE Sensitive     ERYTHROMYCIN >=8 RESISTANT Resistant     GENTAMICIN <=0.5 SENSITIVE Sensitive     OXACILLIN >=4 RESISTANT Resistant     TETRACYCLINE <=1 SENSITIVE Sensitive     VANCOMYCIN 1 SENSITIVE Sensitive     TRIMETH/SULFA <=10 SENSITIVE Sensitive     CLINDAMYCIN <=0.25 SENSITIVE Sensitive     RIFAMPIN <=0.5 SENSITIVE Sensitive     Inducible Clindamycin NEGATIVE Sensitive     LINEZOLID 2 SENSITIVE Sensitive     * RARE METHICILLIN RESISTANT STAPHYLOCOCCUS AUREUS      Marcos Eke, NP [facility]  02/05/2023  11:36 AM

## 2023-02-05 NOTE — TOC Transition Note (Addendum)
Transition of Care [facility]) - CM/SW Discharge Note   Patient Details  Name: [name] MRN: [id number] Date of Birth: 08-10-1975  Transition of Care [facility]) CM/SW Contact:  Tom-Johnson, Hershal Coria, RN Phone Number: 02/05/2023, 2:47 PM   Clinical Narrative:     Patient is scheduled for discharge today.  Readmission Risk Assessment done. Home Health referral called in to [facility] per patient's request and kelly voiced acceptance, info on AVS. Outpatient f/u, hospital f/u and discharge instructions on AVS. Wound Vac, BSC and Wheelchair ordered from [employer] and [employer] to deliver to patient at bedside.  Prescriptions sent to [facility] pharmacy and meds will be delivered to patient at bedside prior discharge. Family to transport at discharge.  No further TOC needs noted.  CM informed by [employer] that patient declined Wheelchair and BSC. Wound Vac will be delivered to patient at bedside.   No further TOC needs noted.        Final next level of care: Home w Home Health Services Barriers to Discharge: Barriers Resolved   Patient Goals and CMS Choice CMS Medicare.gov Compare Post Acute Care list provided to:: Patient Choice offered to / list presented to : Patient  Discharge Placement                  Patient to be transferred to facility by: Family      Discharge Plan and Services Additional resources added to the After Visit Summary for                  DME Arranged: Bedside commode, Lightweight manual wheelchair with seat cushion, Vac DME Agency: [employer] Date DME Agency Contacted: 02/05/23 Time DME Agency Contacted: 1425 Representative spoke with at DME Agency: Deretha Emory HH Arranged: RN, Social Work (Wound Care and Wound Vac changes) HH Agency: [facility] Date [facility] Agency Contacted: 02/05/23 Time HH Agency Contacted: 1355 Representative spoke with at [employer] Agency: Tresa Endo  Social Determinants of Health (SDOH) Interventions SDOH Screenings   Food  Insecurity: No Food Insecurity (01/28/2023)  Housing: Patient Declined (02/02/2023)  Transportation Needs: Unmet Transportation Needs (02/02/2023)  Utilities: Not At Risk (01/28/2023)  Alcohol Screen: Low Risk  (12/29/2022)  Depression (PHQ2-9): Low Risk  (01/14/2023)  Financial Resource Strain: Low Risk  (12/29/2022)  Physical Activity: Insufficiently Active (12/29/2022)  Social Connections: Socially Isolated (12/29/2022)  Stress: No Stress Concern Present (12/29/2022)  Tobacco Use: Medium Risk (02/01/2023)  Health Literacy: Adequate Health Literacy (12/29/2022)     Readmission Risk Interventions    02/05/2023    2:24 PM 12/16/2022    2:42 PM  Readmission Risk Prevention Plan  Transportation Screening Complete Complete  PCP or Specialist Appt within 3-5 Days  Complete  HRI or Home Care Consult  Complete  Social Work Consult for Recovery Care Planning/Counseling  Complete  Palliative Care Screening  Not Applicable  Medication Review [employer]) Referral to Pharmacy Complete  PCP or Specialist appointment within 3-5 days of discharge Complete   HRI or Home Care Consult Complete   SW Recovery Care/Counseling Consult Complete   Palliative Care Screening Not Applicable   Skilled Nursing Facility Not Applicable

## 2023-02-05 NOTE — Discharge Summary (Signed)
Physician Discharge Summary  Perry Marrano Pilson KZS:[id number] DOB: [birth year] DOA: 01/27/2023  PCP: [name], MD  Admit date: 01/27/2023 Discharge date: 02/05/2023  Admitted From: Home Disposition:  Home  Discharge Condition:Stable CODE STATUS:FULL Diet recommendation: renal  Brief/Interim Summary: Patient is a 47 year old male with history of ESRD, failed renal transplant, right lower extremity osteomyelitis status post first ray transmetatarsal amputation who was brought from outpatient dialysis center for further evaluation of hypotension. He was recently treated with prolonged course of antibiotics for right lower extremity osteomyelitis .patient was also having thick drainage from his left femoral tunneled catheter for 3 days. Patient was suspected to have septic shock and admitted under PCCM service, started on pressors. Blood cultures showed proteus. ID, nephrology, vascular surgery were consulted . Patient transferred to [facility] service on 8/22.  Right foot MRI showed osteomyelitis.  Podiatry did irrigation and debridement, wound cultures showed MRSA.  Antibiotics have  been finalized by ID.  New tunneled HD catheter was placed by IR on left groin on 8/26.  ID/nephrology cleared for discharge.  Medically stable for discharge home today.  He will follow-up with ID, podiatry as an outpatient  Following problems were addressed during the hospitalization:  Septic shock/gram-negative bacteremia/HD line infection: Presented with hypotension.  Patient was also having thick drainage from his left femoral tunneled catheter for 3 days.  Patient was suspected to have septic shock and admitted under PCCM service, started on pressors.  Blood cultures showed Proteus.  Sepsis physiology has resolved, lactic acidosis has resolved .currently blood pressure stable.  Patient has chronic hypotension and is on midodrine.  ID recommended cefazolin for 2 weeks for Proteus bacteremia.   ESRD: Patient had access  in the left femoral area.Vascular surgery was following,he was  planned for graft placement in few weeks.   Vascular surgery discussed with IR.Underwent  fluoroscopy guided exchange of left femoral hemodialysis catheter by IR on 8/22.status post left lower extremity venogram, revascularization of occluded left iliac vein with PTA, venous stenting, exchange of tunneled HD catheter on 8/26 by IR.  Hemodialysis catheter now working.Long-term plan is for HeRO graft once done with antibiotic course.    Right lower extremity osteomyelitis: Status post right first ray amputation.  MRI showed changes of osteomyelitis.   .ID was  following.  S/p irrigation and debridement of right foot, allograft application, wound VAC application on 8/25.  Wound cultures showing MRSA..  ID recommended vancomycin and oral metronidazole for 6 weeks, end of therapy on 10/6.  Patient should follow up with podiatry as an outpatient, will be discharged on wound VAC    Hyponatremia: Likely from volume overload.  Volume management as per dialysis as an outpatient   Anemia of chronic disease: Continue to monitor hemoglobin.  Transfuse if hemoglobin drops less than 7   Paroxysmal A-fib: Currently in normal sinus rhythm.  Monitor on telemetry.  Currently Eliquis is on hold. He was On heparin drip now resumed eliquis  anticipating no further operative intervention   Morbid obesity: BMI 40   Depressed mood: Patient noted to be depressed, apathetic due to his current medical conditions.  Started on low-dose Zoloft but patient says he is not depressed and does not want any medication.  Zoloft discontinued   Discharge Diagnoses:  Active Problems:   Paroxysmal atrial fibrillation (HCC)   Septic shock (HCC)   Chronic anticoagulation   Gram-negative bacteremia   ESRD on hemodialysis (HCC)   Non-healing wound of lower extremity   Chronic osteomyelitis of right  foot with draining sinus [facility])    Discharge Instructions  Discharge  Instructions     Diet - low sodium heart healthy   Complete by: As directed    Renal diet   Discharge instructions   Complete by: As directed    1)Please take prescribed medications as instructed 2)Follow up with podiatry next week.Continue wound vac 3)You will be called by infectious disease as an outpatient for follow-up   Home infusion instructions   Complete by: As directed    To be given at the patient's hemodialysis center   Instructions: Flushing of vascular access device: 0.9% NaCl pre/post medication administration and prn patency; Heparin 100 u/ml, 5ml for implanted ports and Heparin 10u/ml, 5ml for all other central venous catheters.   Increase activity slowly   Complete by: As directed    No wound care   Complete by: As directed       Allergies as of 02/05/2023   No Known Allergies      Medication List     TAKE these medications    apixaban 5 MG Tabs tablet Commonly known as: ELIQUIS Take 5 mg by mouth 2 (two) times daily.   atorvastatin 40 MG tablet Commonly known as: LIPITOR Take 1 tablet (40 mg total) by mouth daily.   CALCIUM 600 + D PO Take 2 tablets by mouth daily.   ceFAZolin IVPB Commonly known as: ANCEF Inject 2 g into the vein Every Tuesday,Thursday,and Saturday with dialysis for 11 days. Indication:  Proteus TDC infection Last Day of Therapy:  02/16/23 (last dose 02/14/23 with Saturday HD) Labs - Once weekly:  CBC/D and BMP, Labs - Once weekly: ESR and CRP Method of administration: Per hemodialysis protocol at the HD center   Dialyvite/Zinc Tabs Take 1 tablet by mouth daily.   ferric citrate 1 GM 210 MG(Fe) tablet Commonly known as: AURYXIA Take 210 mg by mouth 3 (three) times daily with meals.   metroNIDAZOLE 500 MG tablet Commonly known as: Flagyl Take 1 tablet (500 mg total) by mouth 2 (two) times daily.   midodrine 5 MG tablet Commonly known as: PROAMATINE Take 15 mg by mouth 3 (three) times daily.   vancomycin IVPB Inject 1,000  mg into the vein Every Tuesday,Thursday,and Saturday with dialysis. Indication:  MRSA R-foot osteo Last Day of Therapy:  03/15/23 (last dose Sat, 03/14/23 with HD) Labs - Sunday/Monday:  CBC/D, BMP, and vancomycin trough. Labs - Once weekly: ESR and CRP, pre-HD Vancomycin level Method of administration: Per HD protocol at the HD center               Home Infusion Instuctions  (From admission, onward)           Start     Ordered   02/05/23 0000  Home infusion instructions       Comments: To be given at the patient's hemodialysis center  Question:  Instructions  Answer:  Flushing of vascular access device: 0.9% NaCl pre/post medication administration and prn patency; Heparin 100 u/ml, 5ml for implanted ports and Heparin 10u/ml, 5ml for all other central venous catheters.   02/05/23 1313            Follow-up Information     Rudd, Bertram Millard, MD. Schedule an appointment as soon as possible for a visit in 1 week(s).   Specialty: Family Medicine Contact information: [address] [place] [address] [phone]  No Known Allergies  Consultations: Podiatry, infectious disease, vascular surgery, IR, nephrology, PCCM   Procedures/Studies: IR Venocavagram Ivc  Result Date: 02/02/2023 INDICATION: 47 year old male with chronic renal failure, has had multiple prior surgical dialysis circuit performed, multiple prior catheter. 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 The current left femoral tunneled hemodialysis catheter is nonfunctioning, position within collateral venous drainage of the left pelvis. He presents today for possible venous reconstruction and tunneled catheter exchange, versus possible transhepatic dialysis catheter placement. EXAM: IMAGE GUIDED EXCHANGE OF TUNNELED HEMODIALYSIS CATHETER LEFT LOWER EXTREMITY VENOGRAM BALLOON  ANGIOPLASTY AND STENT RECONSTRUCTION OF LEFT COMMON ILIAC VEIN OCCLUSION MEDICATIONS: 2 g Ancef. The antibiotic was given in an appropriate time interval prior to skin puncture. 6000 units IV heparin ANESTHESIA/SEDATION: Moderate (conscious) sedation was employed during this procedure. A total of Versed 2.0 mg and Fentanyl 200 mcg was administered intravenously by the radiology nurse. Total intra-service moderate Sedation Time: 43 minutes. The patient's level of consciousness and vital signs were monitored continuously by radiology nursing throughout the procedure under my direct supervision. FLUOROSCOPY: Radiation Exposure Index (as provided by the fluoroscopic device): 908 mGy Kerma COMPLICATIONS: None PROCEDURE: Informed written consent was obtained from the patient after a discussion of the risks, benefits, and alternatives to treatment. Questions regarding the procedure were encouraged and answered. The left inguinal region including the indwelling catheter were prepped with chlorhexidine in a sterile fashion, and a sterile drape was applied covering the operative field. Maximum barrier sterile technique with sterile gowns and gloves were used for the procedure. A timeout was performed prior to the initiation of the procedure. Spot images of the pelvis performed. 1% lidocaine was used for local anesthesia. Bentson wire was advanced through the nonfunctioning tunneled hemodialysis catheter. A standard 9 French sheath was placed on the wire. The sheath was not long enough to enter the vein for a durable purchase. A 10 French tips sheath was then placed on the Bentson wire into the iliac venous system. Dilator was removed and venogram performed. A combination of a never cross crossing catheter and a stiff Glidewire were used to cross the left common iliac vein occlusion. Once the catheter was in the IVC the wire was removed. Venogram was performed. Bentson wire was then passed into the IVC. Venogram at the sheath  was performed. Balloon angioplasty was then performed of the occluded iliac vein, 12 mm x 40 mm. The sheath was then advanced into the lower IVC for repeat venogram. Sheath was withdrawn. We then placed the crossing catheter coaxial alongside the Bentson wire, for a somewhat tenia sh injection into the right common iliac vein to establish the right common iliac inflow. Catheter was removed and then a final balloon angioplasty was then performed of infrarenal IVC stenosis, 12 mm x 40 mm. We elected then 2 stent the left common iliac vein, knowing that the durability of the occluded venous segment would be poor without stenting. A 14 mm x 60 mm dedicated operate venous stent was deployed. 14 mm balloon angioplasty was then performed for post dilation. We then elected to place a 28 cm tip to cuff tunneled hemodialysis catheter, which was placed on the Bentson wire. The wire and the introducers were removed. The catheter aspirates well at the conclusion. Final images were stored. The catheter aspirates and flushes normally. The catheter was flushed with appropriate volume heparin dwells. The catheter exit site was secured  with a 0-Prolene retention suture. Dressings were applied. The patient tolerated the procedure well without immediate post procedural complication. FINDINGS: Current catheter is nonfunctional, with inability to aspirate the blue and red hubs. Venogram of the left lower extremity demonstrates patent confluence of the left internal iliac vein and external iliac vein, with occlusion of the common iliac vein. Suprarenal IVC occlusion. Flow is reversed from the IVC through the renal veins. The renal veins drain the infrarenal IVC flow via collateral network. 50% stenosis of the infrarenal IVC secondary to chronic venous disease. Balloon angioplasty and stenting of left common iliac vein, restored patency after placement of 14 mm diameter dedicated venous stent. Operational tunneled catheter at the  conclusion. We elected to place a 28 cm tip to cuff catheter. A 33 cm tip to cath catheter would also be reasonable. IMPRESSION: Status post left lower extremity venogram, angioplasty and stenting of iliac venous occlusion, angioplasty of infrarenal IVC stenosis, and replacement of left femoral vein tunneled hemodialysis catheter with a new 28 cm tip to cuff catheter. Signed, Yvone Neu. Miachel Roux, RPVI [facility] Radiology Electronically Signed   By: Gilmer Mor D.O.   On: 02/02/2023 13:03   IR Fluoro Guide CV Line Left  Result Date: 02/02/2023 INDICATION: 47 year old male with chronic renal failure, has had multiple prior surgical dialysis circuit performed, multiple prior catheter. 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 The current left femoral tunneled hemodialysis catheter is nonfunctioning, position within collateral venous drainage of the left pelvis. He presents today for possible venous reconstruction and tunneled catheter exchange, versus possible transhepatic dialysis catheter placement. EXAM: IMAGE GUIDED EXCHANGE OF TUNNELED HEMODIALYSIS CATHETER LEFT LOWER EXTREMITY VENOGRAM BALLOON ANGIOPLASTY AND STENT RECONSTRUCTION OF LEFT COMMON ILIAC VEIN OCCLUSION MEDICATIONS: 2 g Ancef. The antibiotic was given in an appropriate time interval prior to skin puncture. 6000 units IV heparin ANESTHESIA/SEDATION: Moderate (conscious) sedation was employed during this procedure. A total of Versed 2.0 mg and Fentanyl 200 mcg was administered intravenously by the radiology nurse. Total intra-service moderate Sedation Time: 43 minutes. The patient's level of consciousness and vital signs were monitored continuously by radiology nursing throughout the procedure under my direct supervision. FLUOROSCOPY: Radiation Exposure Index (as provided by the fluoroscopic  device): 908 mGy Kerma COMPLICATIONS: None PROCEDURE: Informed written consent was obtained from the patient after a discussion of the risks, benefits, and alternatives to treatment. Questions regarding the procedure were encouraged and answered. The left inguinal region including the indwelling catheter were prepped with chlorhexidine in a sterile fashion, and a sterile drape was applied covering the operative field. Maximum barrier sterile technique with sterile gowns and gloves were used for the procedure. A timeout was performed prior to the initiation of the procedure. Spot images of the pelvis performed. 1% lidocaine was used for local anesthesia. Bentson wire was advanced through the nonfunctioning tunneled hemodialysis catheter. A standard 9 French sheath was placed on the wire. The sheath was not long enough to enter the vein for a durable purchase. A 10 French tips sheath was then placed on the Bentson wire into the iliac venous system. Dilator was removed and venogram performed. A combination of a never cross crossing catheter and a stiff Glidewire were used to cross the left common iliac vein occlusion. Once the catheter was in the IVC the wire was removed. Venogram was performed. Bentson wire was then passed  into the IVC. Venogram at the sheath was performed. Balloon angioplasty was then performed of the occluded iliac vein, 12 mm x 40 mm. The sheath was then advanced into the lower IVC for repeat venogram. Sheath was withdrawn. We then placed the crossing catheter coaxial alongside the Bentson wire, for a somewhat tenia sh injection into the right common iliac vein to establish the right common iliac inflow. Catheter was removed and then a final balloon angioplasty was then performed of infrarenal IVC stenosis, 12 mm x 40 mm. We elected then 2 stent the left common iliac vein, knowing that the durability of the occluded venous segment would be poor without stenting. A 14 mm x 60 mm dedicated operate  venous stent was deployed. 14 mm balloon angioplasty was then performed for post dilation. We then elected to place a 28 cm tip to cuff tunneled hemodialysis catheter, which was placed on the Bentson wire. The wire and the introducers were removed. The catheter aspirates well at the conclusion. Final images were stored. The catheter aspirates and flushes normally. The catheter was flushed with appropriate volume heparin dwells. The catheter exit site was secured with a 0-Prolene retention suture. Dressings were applied. The patient tolerated the procedure well without immediate post procedural complication. FINDINGS: Current catheter is nonfunctional, with inability to aspirate the blue and red hubs. Venogram of the left lower extremity demonstrates patent confluence of the left internal iliac vein and external iliac vein, with occlusion of the common iliac vein. Suprarenal IVC occlusion. Flow is reversed from the IVC through the renal veins. The renal veins drain the infrarenal IVC flow via collateral network. 50% stenosis of the infrarenal IVC secondary to chronic venous disease. Balloon angioplasty and stenting of left common iliac vein, restored patency after placement of 14 mm diameter dedicated venous stent. Operational tunneled catheter at the conclusion. We elected to place a 28 cm tip to cuff catheter. A 33 cm tip to cath catheter would also be reasonable. IMPRESSION: Status post left lower extremity venogram, angioplasty and stenting of iliac venous occlusion, angioplasty of infrarenal IVC stenosis, and replacement of left femoral vein tunneled hemodialysis catheter with a new 28 cm tip to cuff catheter. Signed, Yvone Neu. Miachel Roux, RPVI [facility] Radiology Specialists [facility] Radiology Electronically Signed   By: Gilmer Mor D.O.   On: 02/02/2023 13:03   IR PTS AND STENT ADDL CENTRAL DIALY SEG THRU DIALY CIRCUIT LEFT  Result Date: 02/02/2023 INDICATION: 47 year old male  with chronic renal failure, has had multiple prior surgical dialysis circuit performed, multiple prior catheter. 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 The current left femoral tunneled hemodialysis catheter is nonfunctioning, position within collateral venous drainage of the left pelvis. He presents today for possible venous reconstruction and tunneled catheter exchange, versus possible transhepatic dialysis catheter placement. EXAM: IMAGE GUIDED EXCHANGE OF TUNNELED HEMODIALYSIS CATHETER LEFT LOWER EXTREMITY VENOGRAM BALLOON ANGIOPLASTY AND STENT RECONSTRUCTION OF LEFT COMMON ILIAC VEIN OCCLUSION MEDICATIONS: 2 g Ancef. The antibiotic was given in an appropriate time interval prior to skin puncture. 6000 units IV heparin ANESTHESIA/SEDATION: Moderate (conscious) sedation was employed during this procedure. A total of Versed 2.0 mg and Fentanyl 200 mcg was administered intravenously by the radiology nurse. Total intra-service moderate Sedation Time: 43 minutes. The patient's level of consciousness and vital signs were monitored continuously by radiology nursing throughout the procedure under my direct supervision. FLUOROSCOPY: Radiation Exposure  Index (as provided by the fluoroscopic device): 908 mGy Kerma COMPLICATIONS: None PROCEDURE: Informed written consent was obtained from the patient after a discussion of the risks, benefits, and alternatives to treatment. Questions regarding the procedure were encouraged and answered. The left inguinal region including the indwelling catheter were prepped with chlorhexidine in a sterile fashion, and a sterile drape was applied covering the operative field. Maximum barrier sterile technique with sterile gowns and gloves were used for the procedure. A timeout was performed prior to the initiation of the procedure. Spot images of the pelvis performed. 1% lidocaine was used  for local anesthesia. Bentson wire was advanced through the nonfunctioning tunneled hemodialysis catheter. A standard 9 French sheath was placed on the wire. The sheath was not long enough to enter the vein for a durable purchase. A 10 French tips sheath was then placed on the Bentson wire into the iliac venous system. Dilator was removed and venogram performed. A combination of a never cross crossing catheter and a stiff Glidewire were used to cross the left common iliac vein occlusion. Once the catheter was in the IVC the wire was removed. Venogram was performed. Bentson wire was then passed into the IVC. Venogram at the sheath was performed. Balloon angioplasty was then performed of the occluded iliac vein, 12 mm x 40 mm. The sheath was then advanced into the lower IVC for repeat venogram. Sheath was withdrawn. We then placed the crossing catheter coaxial alongside the Bentson wire, for a somewhat tenia sh injection into the right common iliac vein to establish the right common iliac inflow. Catheter was removed and then a final balloon angioplasty was then performed of infrarenal IVC stenosis, 12 mm x 40 mm. We elected then 2 stent the left common iliac vein, knowing that the durability of the occluded venous segment would be poor without stenting. A 14 mm x 60 mm dedicated operate venous stent was deployed. 14 mm balloon angioplasty was then performed for post dilation. We then elected to place a 28 cm tip to cuff tunneled hemodialysis catheter, which was placed on the Bentson wire. The wire and the introducers were removed. The catheter aspirates well at the conclusion. Final images were stored. The catheter aspirates and flushes normally. The catheter was flushed with appropriate volume heparin dwells. The catheter exit site was secured with a 0-Prolene retention suture. Dressings were applied. The patient tolerated the procedure well without immediate post procedural complication. FINDINGS: Current catheter  is nonfunctional, with inability to aspirate the blue and red hubs. Venogram of the left lower extremity demonstrates patent confluence of the left internal iliac vein and external iliac vein, with occlusion of the common iliac vein. Suprarenal IVC occlusion. Flow is reversed from the IVC through the renal veins. The renal veins drain the infrarenal IVC flow via collateral network. 50% stenosis of the infrarenal IVC secondary to chronic venous disease. Balloon angioplasty and stenting of left common iliac vein, restored patency after placement of 14 mm diameter dedicated venous stent. Operational tunneled catheter at the conclusion. We elected to place a 28 cm tip to cuff catheter. A 33 cm tip to cath catheter would also be reasonable. IMPRESSION: Status post left lower extremity venogram, angioplasty and stenting of iliac venous occlusion, angioplasty of infrarenal IVC stenosis, and replacement of left femoral vein tunneled hemodialysis catheter with a new 28 cm tip to cuff catheter. Signed, Yvone Neu. Miachel Roux, RPVI [facility] Radiology Specialists [facility] Radiology Electronically Signed   By: Marijean Niemann  Loreta Ave D.O.   On: 02/02/2023 13:03   PERIPHERAL VASCULAR CATHETERIZATION  Result Date: 02/01/2023 See surgical note for result.  CT ABDOMEN PELVIS W CONTRAST  Result Date: 01/29/2023 CLINICAL DATA:  47 year old male with a history of renal failure, difficult access, possible transhepatic catheter. EXAM: CT ABDOMEN AND PELVIS WITH CONTRAST TECHNIQUE: Multidetector CT imaging of the abdomen and pelvis was performed using the standard protocol following bolus administration of intravenous contrast. RADIATION DOSE REDUCTION: This exam was performed according to the departmental dose-optimization program which includes automated exposure control, adjustment of the mA and/or kV according to patient size and/or use of iterative reconstruction technique. CONTRAST:  75mL OMNIPAQUE IOHEXOL 350  MG/ML SOLN COMPARISON:  04/14/2011 FINDINGS: Lower chest: No acute finding of the lower chest. Hepatobiliary: Timing of the contrast bolus is delayed secondary to collateral venous drainage centrally. Hepatomegaly. No focal lesion. Focal fatty infiltration adjacent to the falciform ligament. Gallbladder is decompressed. Pancreas: Unremarkable Spleen: Unremarkable Adrenals/Urinary Tract: - Right adrenal gland:  Unremarkable - Left adrenal gland: Unremarkable. - Right kidney: Atrophic right kidney with nephrolithiasis. No hydronephrosis. - Left Kidney: Atrophic left kidney with nephrolithiasis. No hydronephrosis. - Urinary Bladder: Urinary bladder is decompressed. Stomach/Bowel: - Stomach: Unremarkable. - Small bowel: Unremarkable - Appendix: Normal - Colon: Colon decompressed. Colonic diverticula of the left colon. No acute inflammatory changes. Vascular/Lymphatic: Calcifications of the coronary arteries of the visualized heart. Partial opacification of collateral draining veins of the left chest. Hepatic veins not well opacified given the timing of the contrast bolus. Portal veins not well opacified given the timing of the contrast bolus. Mild atherosclerotic changes of the abdominal aorta. Mild atherosclerotic changes of the bilateral iliac arteries. Proximal femoral arteries are patent. Tunneled left femoral dialysis catheter. Multiple lymph nodes in the retroperitoneum with associated edema. 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 Surgical changes of bilateral femoral loop grafts, with surgical changes of prior excision on the right. Reproductive: Unremarkable prostate Other: Low-density cystic structure in the abdominal wall measures 5.7 cm with some rim calcification. This is secondary to a prior peritoneal dialysis catheter. Rim calcified cystic structure within the mesentery measures 5.8 cm, likely a pseudocyst related to prior peritoneal  dialysis. Partially calcified structure in the right lower pelvis, compatible with failed renal transplant. Musculoskeletal: Changes of renal osteodystrophy. No acute displaced fracture. No bony canal narrowing. Unremarkable hips. IMPRESSION: No acute CT finding. Poor opacification of the hepatic and portal venous vasculature secondary to the timing of the contrast bolus. However, there are no secondary signs of hepatic vein occlusion identified in the liver. These above preliminary results were discussed by telephone at the time of interpretation on 01/29/2023 at 4:38 pm with Dr. Heath Lark. 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 Aortic Atherosclerosis (ICD10-I70.0). Additional ancillary findings as above. Signed, Yvone Neu. Miachel Roux, RPVI [facility] Radiology Electronically Signed   By: Gilmer Mor D.O.   On: 01/29/2023 16:39   IR Fluoro Guide CV Line Left  Result Date: 01/29/2023 INDICATION: Bacteremia. Malfunctioning left common femoral vein approach hemodialysis catheter. Patient with longstanding history of end-stage renal disease renal disease with extremely limited dialysis catheter access, currently receiving intermittently successful dialysis from a chronic left common femoral approach hemodialysis catheter, initially placed by the interventional radiology service on 04/23/2022, by report, most recently  exchanged by the vascular surgery service approximately 1 month ago. The patient presents today for fluoroscopic guided dialysis catheter exchange. EXAM: TUNNELED CENTRAL VENOUS HEMODIALYSIS CATHETER REPLACEMENT WITH FLUOROSCOPIC GUIDANCE MEDICATIONS: None CONTRAST:  25 cc Omnipaque 300 FLUOROSCOPY TIME:  367 mGy COMPLICATIONS: None immediate. PROCEDURE: Informed written consent was obtained from the patient  after a discussion of the risks, benefits, and alternatives to treatment. Questions regarding the procedure were encouraged and answered. The skin and external portion of the existing hemodialysis catheter was prepped with chlorhexidine in a sterile fashion, and a sterile drape was applied covering the operative field. Maximum barrier sterile technique with sterile gowns and gloves were used for the procedure. A timeout was performed prior to the initiation of the procedure. Preprocedural spot fluoroscopic image was obtained of the left pelvis and left common femoral vein approach hemodialysis catheter. The hemodialysis catheter was noted to be located within an atypical positioned, to the left of the L5 vertebral body. Neither lumen of the pre-existing hemodialysis catheter were able to be aspirated. Pull-back venogram performed via the pre-existing hemodialysis catheter demonstrates chronic occlusion of both the IVC as well as the pelvic venous system with filling of multiple para lumbar venous collaterals. Next, two stiff glide wires were advanced through both lumens of the dialysis catheter and exchanged for a new, 23 cm tip to cuff hemodialysis catheter. After catheter exchange, neither lumen was noted to aspirate. As such, the catheter was slightly retracted to the level of the left external iliac vein where some blood return was noted. The dialysis catheter was secured in place at this location. The patient tolerated the procedure well without immediate post procedural complication. IMPRESSION: Technically successful fluoroscopic guided exchange and repositioning of 23 cm tip to cuff tunneled left common femoral vein approach hemodialysis catheter with tip terminating within the left external iliac vein. Neither lumen of the exchanged dialysis catheter was able to be easily aspirated however the pre-existing catheter demonstrated similar functionality and reportedly has been serving as a source of  intermittent successful dialysis and as such the exchanged catheter was left in place for attempted dialysis. PLAN: If this exchanged dialysis catheter does NOT serve as a durable source of successful dialysis, agree with evaluation for consideration of trans hepatic dialysis catheter placement as patient has exhausted all other options for dialysis access. Electronically Signed   By: Simonne Come M.D.   On: 01/29/2023 16:14   IR Veno/Ext/Uni Left  Result Date: 01/29/2023 INDICATION: Bacteremia. Malfunctioning left common femoral vein approach hemodialysis catheter. Patient with longstanding history of end-stage renal disease renal disease with extremely limited dialysis catheter access, currently receiving intermittently successful dialysis from a chronic left common femoral approach hemodialysis catheter, initially placed by the interventional radiology service on 04/23/2022, by report, most recently exchanged by the vascular surgery service approximately 1 month ago. The patient presents today for fluoroscopic guided dialysis catheter exchange. EXAM: TUNNELED CENTRAL VENOUS HEMODIALYSIS CATHETER REPLACEMENT WITH FLUOROSCOPIC GUIDANCE MEDICATIONS: None CONTRAST:  25 cc Omnipaque 300 FLUOROSCOPY TIME:  367 mGy COMPLICATIONS: None immediate. PROCEDURE: Informed written consent was obtained from the patient after a discussion of the risks, benefits, and alternatives to treatment. Questions regarding the procedure were encouraged and answered. The skin and external portion of the existing hemodialysis catheter was prepped with chlorhexidine in a sterile fashion, and a sterile drape was applied covering the operative field. Maximum barrier sterile technique with sterile gowns and gloves were used for the procedure. A timeout was performed prior to the initiation  of the procedure. Preprocedural spot fluoroscopic image was obtained of the left pelvis and left common femoral vein approach hemodialysis catheter. The  hemodialysis catheter was noted to be located within an atypical positioned, to the left of the L5 vertebral body. Neither lumen of the pre-existing hemodialysis catheter were able to be aspirated. Pull-back venogram performed via the pre-existing hemodialysis catheter demonstrates chronic occlusion of both the IVC as well as the pelvic venous system with filling of multiple para lumbar venous collaterals. Next, two stiff glide wires were advanced through both lumens of the dialysis catheter and exchanged for a new, 23 cm tip to cuff hemodialysis catheter. After catheter exchange, neither lumen was noted to aspirate. As such, the catheter was slightly retracted to the level of the left external iliac vein where some blood return was noted. The dialysis catheter was secured in place at this location. The patient tolerated the procedure well without immediate post procedural complication. IMPRESSION: Technically successful fluoroscopic guided exchange and repositioning of 23 cm tip to cuff tunneled left common femoral vein approach hemodialysis catheter with tip terminating within the left external iliac vein. Neither lumen of the exchanged dialysis catheter was able to be easily aspirated however the pre-existing catheter demonstrated similar functionality and reportedly has been serving as a source of intermittent successful dialysis and as such the exchanged catheter was left in place for attempted dialysis. PLAN: If this exchanged dialysis catheter does NOT serve as a durable source of successful dialysis, agree with evaluation for consideration of trans hepatic dialysis catheter placement as patient has exhausted all other options for dialysis access. Electronically Signed   By: Simonne Come M.D.   On: 01/29/2023 16:14   MR FOOT RIGHT WO CONTRAST  Result Date: 01/29/2023 CLINICAL DATA:  Right foot infection. Great toe amputation on 12/12/2022 EXAM: MRI OF THE RIGHT FOREFOOT WITHOUT CONTRAST TECHNIQUE:  Multiplanar, multisequence MR imaging of the right forefoot was performed. No intravenous contrast was administered. COMPARISON:  X-ray 01/27/2023 FINDINGS: Bones/Joint/Cartilage Postsurgical changes from transmetatarsal amputation of the first ray. There is bone marrow edema within the distal 7 mm of the residual first metatarsal diaphysis at the resection margin. No confluent low T1 signal changes at this location. The remaining bony structures of the forefoot are otherwise within normal limits. No fracture or dislocation. No additional sites of focal bone marrow edema or marrow replacement. No joint effusions. Ligaments Intact Lisfranc ligament.  Intact collateral ligaments. Muscles and Tendons Diffuse edema-like signal of the foot musculature which may represent changes related to denervation and/or myositis. No tenosynovitis. Soft tissues Diffuse soft tissue swelling. Irregularity within the skin and superficial soft tissues at the medial aspect of the forefoot, likely along the surgical incision site. There is fluid and edema which tracks to the resection margin of the first metatarsal (series 4, image 29). There are no organized or drainable fluid collections. IMPRESSION: 1. 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 There is bone marrow edema within the distal aspect of the residual first metatarsal diaphysis at the resection margin. Appearance is most suggestive of early acute osteomyelitis given the appearance of the overlying soft tissues. 2. Fluid and edema tracks to the resection margin of the first metatarsal. 3. No organized or drainable fluid collections. 4. Diffuse edema-like signal of the foot musculature which may represent changes related to denervation and/or myositis. Electronically Signed   By: Duanne Guess D.O.   On: 01/29/2023 14:39   DG Foot Complete Right  Result Date: 01/27/2023  CLINICAL DATA:  Osteomyelitis. Status post amputation. Possible sepsis.  EXAM: RIGHT FOOT COMPLETE - 3+ VIEW COMPARISON:  Right foot radiographs 12/12/2022 and 12/08/2022 FINDINGS: Redemonstration of amputation of the great toe to the mid metatarsal shaft. Within the limitations of recent surgery, no definite new cortical erosion is seen at the distal amputation site. There is soft tissue swelling at the amputation site. Interval decrease in air at the distal amputation site compared to immediate postoperative radiographs 12/12/2022. Moderate plantar calcaneal heel spur. No acute fracture or dislocation. IMPRESSION: Redemonstration of amputation of the great toe to the mid metatarsal shaft. Within the limitations of recent surgery, no definite new cortical erosion is seen at the distal amputation site. Electronically Signed   By: Neita Garnet M.D.   On: 01/27/2023 14:51   DG Chest Portable 1 View  Result Date: 01/27/2023 CLINICAL DATA:  Sepsis.  Toe amputation.  Possible sepsis. EXAM: PORTABLE CHEST 1 VIEW COMPARISON:  Chest radiographs 02/15/2021, 12/06/2022, 12/07/2022 FINDINGS: Moderately decreased lung volumes. Cardiac silhouette and mediastinal contours are within normal limits for technique and low lung volumes. No focal airspace opacity is seen. No pulmonary edema, pleural effusion, or pneumothorax. No acute skeletal abnormality. IMPRESSION: Low lung volumes. No pneumonia is identified. Electronically Signed   By: Neita Garnet M.D.   On: 01/27/2023 14:46      Subjective: Patient seen and examined at bedside today.  Hemodynamically stable.  Comfortably lying at dialysis suite.  Denies any complaints of pain.  Medically stable for discharge to home today  Discharge Exam: Vitals:   02/05/23 1300 02/05/23 1315  BP: 94/75 (!) 91/55  Pulse: 78 76  Resp: 14 11  Temp:    SpO2: 97% 96%   Vitals:   02/05/23 1253 02/05/23 1254 02/05/23 1300 02/05/23 1315  BP:  94/75 94/75 (!) 91/55  Pulse: 76 79 78 76  Resp: 10 12 14 11   Temp:      TempSrc:      SpO2: 100% 99%  97% 96%  Weight:      Height:        General: Pt is alert, awake, not in acute distress, morbidly obese Cardiovascular: RRR, S1/S2 +, no rubs, no gallops Respiratory: CTA bilaterally, no wheezing, no rhonchi Abdominal: Soft, NT, ND, bowel sounds +, left groin dialysis catheter Extremities: Right foot covered with dressing, wound VAC    The results of significant diagnostics from this hospitalization (including imaging, microbiology, ancillary and laboratory) are listed below for reference.     Microbiology: Recent Results (from the past 240 hour(s))  Culture, blood (Routine x 2)     Status: Abnormal   Collection Time: 01/27/23 11:00 AM   Specimen: BLOOD  Result Value Ref Range Status   Specimen Description BLOOD RIGHT ANTECUBITAL  Final   Special Requests   Final    BOTTLES DRAWN AEROBIC AND ANAEROBIC Blood Culture adequate volume   Culture  Setup Time   Final    GRAM NEGATIVE RODS IN BOTH AEROBIC AND ANAEROBIC BOTTLES CRITICAL RESULT CALLED TO, READ BACK BY AND VERIFIED WITH: V BRYK,[id number]  01/28/23 MK Performed at [facility], [address], [place], [place] [address]    Culture PROTEUS MIRABILIS (A)  Final   Report Status 01/30/2023 FINAL  Final   Organism ID, Bacteria PROTEUS MIRABILIS  Final      Susceptibility   Proteus mirabilis - MIC*    AMPICILLIN <=2 SENSITIVE Sensitive     CEFEPIME <=0.12 SENSITIVE Sensitive  CEFTAZIDIME <=1 SENSITIVE Sensitive     CEFTRIAXONE <=0.25 SENSITIVE Sensitive     CIPROFLOXACIN <=0.25 SENSITIVE Sensitive     GENTAMICIN <=1 SENSITIVE Sensitive     IMIPENEM 2 SENSITIVE Sensitive     TRIMETH/SULFA <=20 SENSITIVE Sensitive     AMPICILLIN/SULBACTAM <=2 SENSITIVE Sensitive     PIP/TAZO <=4 SENSITIVE Sensitive     * PROTEUS MIRABILIS  Blood Culture ID Panel (Reflexed)     Status: Abnormal   Collection Time: 01/27/23 11:00 AM  Result Value Ref Range Status   Enterococcus faecalis NOT DETECTED NOT DETECTED Final    Enterococcus Faecium NOT DETECTED NOT DETECTED Final   Listeria monocytogenes NOT DETECTED NOT DETECTED Final   Staphylococcus species NOT DETECTED NOT DETECTED Final   Staphylococcus aureus (BCID) NOT DETECTED NOT DETECTED Final   Staphylococcus epidermidis NOT DETECTED NOT DETECTED Final   Staphylococcus lugdunensis NOT DETECTED NOT DETECTED Final   Streptococcus species NOT DETECTED NOT DETECTED Final   Streptococcus agalactiae NOT DETECTED NOT DETECTED Final   Streptococcus pneumoniae NOT DETECTED NOT DETECTED Final   Streptococcus pyogenes NOT DETECTED NOT DETECTED Final   A.calcoaceticus-baumannii NOT DETECTED NOT DETECTED Final   Bacteroides fragilis NOT DETECTED NOT DETECTED Final   Enterobacterales DETECTED (A) NOT DETECTED Final    Comment: Enterobacterales represent a large order of gram negative bacteria, not a single organism. CRITICAL RESULT CALLED TO, READ BACK BY AND VERIFIED WITH: V BRYK,[email or web address]@[id number]  01/28/23 MK    Enterobacter cloacae complex NOT DETECTED NOT DETECTED Final   Escherichia coli NOT DETECTED NOT DETECTED Final   Klebsiella aerogenes NOT DETECTED NOT DETECTED Final   Klebsiella oxytoca NOT DETECTED NOT DETECTED Final   Klebsiella pneumoniae NOT DETECTED NOT DETECTED Final   Proteus species DETECTED (A) NOT DETECTED Final    Comment: CRITICAL RESULT CALLED TO, READ BACK BY AND VERIFIED WITH: V BRYK,[id number]  01/28/23 MK    Salmonella species NOT DETECTED NOT DETECTED Final   Serratia marcescens NOT DETECTED NOT DETECTED Final   Haemophilus influenzae NOT DETECTED NOT DETECTED Final   Neisseria meningitidis NOT DETECTED NOT DETECTED Final   Pseudomonas aeruginosa NOT DETECTED NOT DETECTED Final   Stenotrophomonas maltophilia NOT DETECTED NOT DETECTED Final   Candida albicans NOT DETECTED NOT DETECTED Final   Candida auris NOT DETECTED NOT DETECTED Final   Candida glabrata NOT DETECTED NOT DETECTED Final   Candida krusei NOT DETECTED NOT DETECTED Final    Candida parapsilosis NOT DETECTED NOT DETECTED Final   Candida tropicalis NOT DETECTED NOT DETECTED Final   Cryptococcus neoformans/gattii NOT DETECTED NOT DETECTED Final   CTX-M ESBL NOT DETECTED NOT DETECTED Final   Carbapenem resistance IMP NOT DETECTED NOT DETECTED Final   Carbapenem resistance KPC NOT DETECTED NOT DETECTED Final   Carbapenem resistance NDM NOT DETECTED NOT DETECTED Final   Carbapenem resist OXA 48 LIKE NOT DETECTED NOT DETECTED Final   Carbapenem resistance VIM NOT DETECTED NOT DETECTED Final    Comment: Performed at [facility], [address], [place], [place] [address]  SARS Coronavirus 2 by RT PCR (hospital order, performed in [facility] hospital lab) *cepheid single result test* Anterior Nasal Swab     Status: None   Collection Time: 01/27/23 11:42 AM   Specimen: Anterior Nasal Swab  Result Value Ref Range Status   SARS Coronavirus 2 by RT PCR NEGATIVE NEGATIVE Final    Comment: Performed at [facility], [address], [place], [place] [address]  Culture, blood (Routine x  2)     Status: None   Collection Time: 01/27/23 11:50 AM   Specimen: BLOOD RIGHT HAND  Result Value Ref Range Status   Specimen Description BLOOD RIGHT HAND  Final   Special Requests   Final    BOTTLES DRAWN AEROBIC AND ANAEROBIC Blood Culture adequate volume   Culture   Final    NO GROWTH 5 DAYS Performed at [facility], [address], [place], [place] [address]    Report Status 02/01/2023 FINAL  Final  MRSA Next Gen by PCR, Nasal     Status: None   Collection Time: 01/27/23  3:09 PM   Specimen: Nasal Mucosa; Nasal Swab  Result Value Ref Range Status   MRSA by PCR Next Gen NOT DETECTED NOT DETECTED Final    Comment: (NOTE) The GeneXpert MRSA Assay (FDA approved for NASAL specimens only), is one component of a comprehensive MRSA colonization surveillance program. It is not intended to diagnose MRSA infection nor to guide or monitor treatment for MRSA  infections. Test performance is not FDA approved in patients less than 20 years old. Performed at [facility], [address], [place], [place] [address]   Culture, blood (Routine X 2) w Reflex to ID Panel     Status: None   Collection Time: 01/29/23  8:33 AM   Specimen: BLOOD LEFT ARM  Result Value Ref Range Status   Specimen Description BLOOD LEFT ARM  Final   Special Requests   Final    BOTTLES DRAWN AEROBIC ONLY Blood Culture adequate volume   Culture   Final    NO GROWTH 6 DAYS Performed at [facility], [address], [place], [place] [address]    Report Status 02/04/2023 FINAL  Final  Culture, blood (Routine X 2) w Reflex to ID Panel     Status: None   Collection Time: 01/29/23  8:35 AM   Specimen: BLOOD RIGHT HAND  Result Value Ref Range Status   Specimen Description BLOOD RIGHT HAND  Final   Special Requests   Final    BOTTLES DRAWN AEROBIC ONLY Blood Culture adequate volume   Culture   Final    NO GROWTH 6 DAYS Performed at [facility], [address] [place], [place] [address]    Report Status 02/04/2023 FINAL  Final  Aerobic/Anaerobic Culture w Gram Stain (surgical/deep wound)     Status: None (Preliminary result)   Collection Time: 02/01/23  3:07 PM   Specimen: Foot, Right; Tissue  Result Value Ref Range Status   Specimen Description TISSUE  Final   Special Requests right metatarsal bone  Final   Gram Stain   Final    RARE GRAM POSITIVE COCCI NO WBC SEEN Performed at [facility], [address] [place], [place] [address]    Culture   Final    RARE STAPHYLOCOCCUS AUREUS SUSCEPTIBILITIES PERFORMED ON PREVIOUS CULTURE WITHIN THE LAST 5 DAYS. NO ANAEROBES ISOLATED; CULTURE IN PROGRESS FOR 5 DAYS    Report Status PENDING  Incomplete  Aerobic/Anaerobic Culture w Gram Stain (surgical/deep wound)     Status: None (Preliminary result)   Collection Time: 02/01/23  3:08 PM   Specimen: Path fluid; Amputation  Result Value Ref Range  Status   Specimen Description WOUND  Final   Special Requests right foot  Final   Gram Stain   Final    RARE WBC PRESENT, PREDOMINANTLY PMN NO ORGANISMS SEEN Performed at [facility]  Lab, [address] [place], [place] [address]    Culture   Final    RARE METHICILLIN RESISTANT STAPHYLOCOCCUS AUREUS NO ANAEROBES ISOLATED; CULTURE IN PROGRESS FOR 5 DAYS    Report Status PENDING  Incomplete   Organism ID, Bacteria METHICILLIN RESISTANT STAPHYLOCOCCUS AUREUS  Final      Susceptibility   Methicillin resistant staphylococcus aureus - MIC*    CIPROFLOXACIN <=0.5 SENSITIVE Sensitive     ERYTHROMYCIN >=8 RESISTANT Resistant     GENTAMICIN <=0.5 SENSITIVE Sensitive     OXACILLIN >=4 RESISTANT Resistant     TETRACYCLINE <=1 SENSITIVE Sensitive     VANCOMYCIN 1 SENSITIVE Sensitive     TRIMETH/SULFA <=10 SENSITIVE Sensitive     CLINDAMYCIN <=0.25 SENSITIVE Sensitive     RIFAMPIN <=0.5 SENSITIVE Sensitive     Inducible Clindamycin NEGATIVE Sensitive     LINEZOLID 2 SENSITIVE Sensitive     * RARE METHICILLIN RESISTANT STAPHYLOCOCCUS AUREUS     Labs: BNP (last 3 results) No results for input(s): "BNP" in the last 8760 hours. Basic Metabolic Panel: Recent Labs  Lab 02/02/23 0810 02/03/23 0953 02/03/23 1905 02/04/23 0438 02/05/23 0510  NA 132* 133* 132* 131* 130*  K 5.8* 4.5 3.7 4.0 3.9  CL 96* 95* 93* 95* 92*  CO2 14* 23 24 22  20*  GLUCOSE 74 103* 94 81 81  BUN 123* 68* 42* 48* 55*  CREATININE 24.66* 16.15* 11.62* 12.85* 14.85*  CALCIUM 8.6* 8.2* 8.6* 8.6* 9.2  PHOS 10.6* 8.4* 5.6* 6.5* 7.6*   Liver Function Tests: Recent Labs  Lab 02/02/23 0810 02/03/23 0953 02/03/23 1905 02/04/23 0438 02/05/23 0510  ALBUMIN 2.8* 2.7* 2.8* 2.9* 3.1*   No results for input(s): "LIPASE", "AMYLASE" in the last 168 hours. No results for input(s): "AMMONIA" in the last 168 hours. CBC: Recent Labs  Lab 01/30/23 0505 01/31/23 0818 02/01/23 0949 02/02/23 0810 02/05/23 0510  WBC  11.1* 8.9 8.4 7.7 7.3  HGB 8.2* 7.9* 8.3* 8.2* 8.4*  HCT 25.5* 24.4* 25.5* 24.9* 26.4*  MCV 84.7 84.4 86.1 85.6 86.0  PLT 303 306 316 328 360   Cardiac Enzymes: No results for input(s): "CKTOTAL", "CKMB", "CKMBINDEX", "TROPONINI" in the last 168 hours. BNP: Invalid input(s): "POCBNP" CBG: No results for input(s): "GLUCAP" in the last 168 hours. D-Dimer No results for input(s): "DDIMER" in the last 72 hours. Hgb A1c No results for input(s): "HGBA1C" in the last 72 hours. Lipid Profile No results for input(s): "CHOL", "HDL", "LDLCALC", "TRIG", "CHOLHDL", "LDLDIRECT" in the last 72 hours. Thyroid function studies No results for input(s): "TSH", "T4TOTAL", "T3FREE", "THYROIDAB" in the last 72 hours.  Invalid input(s): "FREET3" Anemia work up No results for input(s): "VITAMINB12", "FOLATE", "FERRITIN", "TIBC", "IRON", "RETICCTPCT" in the last 72 hours. Urinalysis    Component Value Date/Time   COLORURINE STRAW (A) 05/10/2018 1811   APPEARANCEUR CLEAR 05/10/2018 1811   LABSPEC 1.012 05/10/2018 1811   PHURINE 7.0 05/10/2018 1811   GLUCOSEU 150 (A) 05/10/2018 1811   HGBUR SMALL (A) 05/10/2018 1811   BILIRUBINUR NEGATIVE 11/03/2018 1515   KETONESUR NEGATIVE 05/10/2018 1811   PROTEINUR Positive (A) 11/03/2018 1515   PROTEINUR >=300 (A) 05/10/2018 1811   UROBILINOGEN 0.2 11/03/2018 1515   NITRITE NEGATIVE 11/03/2018 1515   NITRITE NEGATIVE 05/10/2018 1811   LEUKOCYTESUR Small (1+) (A) 11/03/2018 1515   Sepsis Labs Recent Labs  Lab 01/31/23 0818 02/01/23 0949 02/02/23 0810 02/05/23 0510  WBC 8.9 8.4 7.7 7.3   Microbiology Recent Results (from the past 240  hour(s))  Culture, blood (Routine x 2)     Status: Abnormal   Collection Time: 01/27/23 11:00 AM   Specimen: BLOOD  Result Value Ref Range Status   Specimen Description BLOOD RIGHT ANTECUBITAL  Final   Special Requests   Final    BOTTLES DRAWN AEROBIC AND ANAEROBIC Blood Culture adequate volume   Culture  Setup Time    Final    GRAM NEGATIVE RODS IN BOTH AEROBIC AND ANAEROBIC BOTTLES CRITICAL RESULT CALLED TO, READ BACK BY AND VERIFIED WITH: V BRYK,PHARMD@[id number]  01/28/23 MK Performed at [facility], [address], [place], [place] [address]    Culture PROTEUS MIRABILIS (A)  Final   Report Status 01/30/2023 FINAL  Final   Organism ID, Bacteria PROTEUS MIRABILIS  Final      Susceptibility   Proteus mirabilis - MIC*    AMPICILLIN <=2 SENSITIVE Sensitive     CEFEPIME <=0.12 SENSITIVE Sensitive     CEFTAZIDIME <=1 SENSITIVE Sensitive     CEFTRIAXONE <=0.25 SENSITIVE Sensitive     CIPROFLOXACIN <=0.25 SENSITIVE Sensitive     GENTAMICIN <=1 SENSITIVE Sensitive     IMIPENEM 2 SENSITIVE Sensitive     TRIMETH/SULFA <=20 SENSITIVE Sensitive     AMPICILLIN/SULBACTAM <=2 SENSITIVE Sensitive     PIP/TAZO <=4 SENSITIVE Sensitive     * PROTEUS MIRABILIS  Blood Culture ID Panel (Reflexed)     Status: Abnormal   Collection Time: 01/27/23 11:00 AM  Result Value Ref Range Status   Enterococcus faecalis NOT DETECTED NOT DETECTED Final   Enterococcus Faecium NOT DETECTED NOT DETECTED Final   Listeria monocytogenes NOT DETECTED NOT DETECTED Final   Staphylococcus species NOT DETECTED NOT DETECTED Final   Staphylococcus aureus (BCID) NOT DETECTED NOT DETECTED Final   Staphylococcus epidermidis NOT DETECTED NOT DETECTED Final   Staphylococcus lugdunensis NOT DETECTED NOT DETECTED Final   Streptococcus species NOT DETECTED NOT DETECTED Final   Streptococcus agalactiae NOT DETECTED NOT DETECTED Final   Streptococcus pneumoniae NOT DETECTED NOT DETECTED Final   Streptococcus pyogenes NOT DETECTED NOT DETECTED Final   A.calcoaceticus-baumannii NOT DETECTED NOT DETECTED Final   Bacteroides fragilis NOT DETECTED NOT DETECTED Final   Enterobacterales DETECTED (A) NOT DETECTED Final    Comment: Enterobacterales represent a large order of gram negative bacteria, not a single organism. CRITICAL RESULT CALLED TO, READ  BACK BY AND VERIFIED WITH: V BRYK,PHARMD@[id number]  01/28/23 MK    Enterobacter cloacae complex NOT DETECTED NOT DETECTED Final   Escherichia coli NOT DETECTED NOT DETECTED Final   Klebsiella aerogenes NOT DETECTED NOT DETECTED Final   Klebsiella oxytoca NOT DETECTED NOT DETECTED Final   Klebsiella pneumoniae NOT DETECTED NOT DETECTED Final   Proteus species DETECTED (A) NOT DETECTED Final    Comment: CRITICAL RESULT CALLED TO, READ BACK BY AND VERIFIED WITH: V BRYK,PHARMD@[id number]  01/28/23 MK    Salmonella species NOT DETECTED NOT DETECTED Final   Serratia marcescens NOT DETECTED NOT DETECTED Final   Haemophilus influenzae NOT DETECTED NOT DETECTED Final   Neisseria meningitidis NOT DETECTED NOT DETECTED Final   Pseudomonas aeruginosa NOT DETECTED NOT DETECTED Final   Stenotrophomonas maltophilia NOT DETECTED NOT DETECTED Final   Candida albicans NOT DETECTED NOT DETECTED Final   Candida auris NOT DETECTED NOT DETECTED Final   Candida glabrata NOT DETECTED NOT DETECTED Final   Candida krusei NOT DETECTED NOT DETECTED Final   Candida parapsilosis NOT DETECTED NOT DETECTED Final   Candida tropicalis NOT DETECTED NOT DETECTED Final   Cryptococcus neoformans/gattii  NOT DETECTED NOT DETECTED Final   CTX-M ESBL NOT DETECTED NOT DETECTED Final   Carbapenem resistance IMP NOT DETECTED NOT DETECTED Final   Carbapenem resistance KPC NOT DETECTED NOT DETECTED Final   Carbapenem resistance NDM NOT DETECTED NOT DETECTED Final   Carbapenem resist OXA 48 LIKE NOT DETECTED NOT DETECTED Final   Carbapenem resistance VIM NOT DETECTED NOT DETECTED Final    Comment: Performed at [facility], [address] [place], [place] [address]  SARS Coronavirus 2 by RT PCR (hospital order, performed in [facility] hospital lab) *cepheid single result test* Anterior Nasal Swab     Status: None   Collection Time: 01/27/23 11:42 AM   Specimen: Anterior Nasal Swab  Result Value Ref Range Status   SARS Coronavirus 2  by RT PCR NEGATIVE NEGATIVE Final    Comment: Performed at [facility], [address] [place], [place] [address]  Culture, blood (Routine x 2)     Status: None   Collection Time: 01/27/23 11:50 AM   Specimen: BLOOD RIGHT HAND  Result Value Ref Range Status   Specimen Description BLOOD RIGHT HAND  Final   Special Requests   Final    BOTTLES DRAWN AEROBIC AND ANAEROBIC Blood Culture adequate volume   Culture   Final    NO GROWTH 5 DAYS Performed at [facility], [address], [place], [place] [address]    Report Status 02/01/2023 FINAL  Final  MRSA Next Gen by PCR, Nasal     Status: None   Collection Time: 01/27/23  3:09 PM   Specimen: Nasal Mucosa; Nasal Swab  Result Value Ref Range Status   MRSA by PCR Next Gen NOT DETECTED NOT DETECTED Final    Comment: (NOTE) The GeneXpert MRSA Assay (FDA approved for NASAL specimens only), is one component of a comprehensive MRSA colonization surveillance program. It is not intended to diagnose MRSA infection nor to guide or monitor treatment for MRSA infections. Test performance is not FDA approved in patients less than 59 years old. Performed at [facility], [address] [place], [place] [address]   Culture, blood (Routine X 2) w Reflex to ID Panel     Status: None   Collection Time: 01/29/23  8:33 AM   Specimen: BLOOD LEFT ARM  Result Value Ref Range Status   Specimen Description BLOOD LEFT ARM  Final   Special Requests   Final    BOTTLES DRAWN AEROBIC ONLY Blood Culture adequate volume   Culture   Final    NO GROWTH 6 DAYS Performed at [facility], [address] [place], [place] [address]    Report Status 02/04/2023 FINAL  Final  Culture, blood (Routine X 2) w Reflex to ID Panel     Status: None   Collection Time: 01/29/23  8:35 AM   Specimen: BLOOD RIGHT HAND  Result Value Ref Range Status   Specimen Description BLOOD RIGHT HAND  Final   Special Requests   Final    BOTTLES DRAWN AEROBIC ONLY  Blood Culture adequate volume   Culture   Final    NO GROWTH 6 DAYS Performed at [facility], [address], [place], [place] [address]    Report Status 02/04/2023 FINAL  Final  Aerobic/Anaerobic Culture w Gram Stain (surgical/deep wound)     Status: None (Preliminary result)   Collection Time: 02/01/23  3:07 PM   Specimen: Foot, Right; Tissue  Result Value  Ref Range Status   Specimen Description TISSUE  Final   Special Requests right metatarsal bone  Final   Gram Stain   Final    RARE GRAM POSITIVE COCCI NO WBC SEEN Performed at [facility], [address], [place], [place] [address]    Culture   Final    RARE STAPHYLOCOCCUS AUREUS SUSCEPTIBILITIES PERFORMED ON PREVIOUS CULTURE WITHIN THE LAST 5 DAYS. NO ANAEROBES ISOLATED; CULTURE IN PROGRESS FOR 5 DAYS    Report Status PENDING  Incomplete  Aerobic/Anaerobic Culture w Gram Stain (surgical/deep wound)     Status: None (Preliminary result)   Collection Time: 02/01/23  3:08 PM   Specimen: Path fluid; Amputation  Result Value Ref Range Status   Specimen Description WOUND  Final   Special Requests right foot  Final   Gram Stain   Final    RARE WBC PRESENT, PREDOMINANTLY PMN NO ORGANISMS SEEN Performed at [facility], [address], [place], [place] [address]    Culture   Final    RARE METHICILLIN RESISTANT STAPHYLOCOCCUS AUREUS NO ANAEROBES ISOLATED; CULTURE IN PROGRESS FOR 5 DAYS    Report Status PENDING  Incomplete   Organism ID, Bacteria METHICILLIN RESISTANT STAPHYLOCOCCUS AUREUS  Final      Susceptibility   Methicillin resistant staphylococcus aureus - MIC*    CIPROFLOXACIN <=0.5 SENSITIVE Sensitive     ERYTHROMYCIN >=8 RESISTANT Resistant     GENTAMICIN <=0.5 SENSITIVE Sensitive     OXACILLIN >=4 RESISTANT Resistant     TETRACYCLINE <=1 SENSITIVE Sensitive     VANCOMYCIN 1 SENSITIVE Sensitive     TRIMETH/SULFA <=10 SENSITIVE Sensitive     CLINDAMYCIN <=0.25 SENSITIVE Sensitive     RIFAMPIN  <=0.5 SENSITIVE Sensitive     Inducible Clindamycin NEGATIVE Sensitive     LINEZOLID 2 SENSITIVE Sensitive     * RARE METHICILLIN RESISTANT STAPHYLOCOCCUS AUREUS    Please note: You were cared for by a hospitalist during your hospital stay. Once you are discharged, your primary care physician will handle any further medical issues. Please note that NO REFILLS for any discharge medications will be authorized once you are discharged, as it is imperative that you return to your primary care physician (or establish a relationship with a primary care physician if you do not have one) for your post hospital discharge needs so that they can reassess your need for medications and monitor your lab values.    Time coordinating discharge: 40 minutes  SIGNED:   Burnadette Pop, MD  [facility] 02/05/2023, 1:34 PM Pager [phone]  If 7PM-7AM, please contact night-coverage [email or web address] Password [email or web address]

## 2023-02-05 NOTE — Progress Notes (Signed)
PHARMACY CONSULT NOTE FOR:  OUTPATIENT  PARENTERAL ANTIBIOTIC THERAPY (OPAT)  Indication: MRSA R-foot osteo and Proteus TDC infection Regimen: Vancomycin 1g/HD-TTS and Flagyl 500 mg po bid thru 03/15/23 (6 weeks) and Cefazolin 2g/HD-TTS thru 9/9 (2 weeks from catheter exchange on 8/26) End date: 03/15/23 (Vancomycin and Flagyl) and 9/9 (Cefazolin)  IV antibiotic discharge orders are pended. To discharging provider:  please sign these orders via discharge navigator,  Select New Orders & click on the button choice - Manage This Unsigned Work.     Thank you for allowing pharmacy to be a part of this patient's care.  Georgina Pillion, PharmD, BCPS, BCIDP Infectious Diseases Clinical Pharmacist 02/05/2023 1:14 PM   **Pharmacist phone directory can now be found on [email or web address] ([email or web address]).  Listed under [facility].

## 2023-02-05 NOTE — Progress Notes (Cosign Needed)
    Durable Medical Equipment  (From admission, onward)           Start     Ordered   02/05/23 1418  For home use only DME lightweight manual wheelchair with seat cushion  Once       Comments: Patient suffers from Rt Lower Leg wound with NWB restrictions which impairs their ability to perform daily activities like dressing and toileting in the home.  A cane or walker will not resolve  issue with performing activities of daily living. A wheelchair will allow patient to safely perform daily activities. Patient is not able to propel themselves in the home using a standard weight wheelchair due to endurance and general weakness. Patient can self propel in the lightweight wheelchair. Length of need 12 months . Accessories: elevating leg rests (ELRs), wheel locks, extensions and anti-tippers.   02/05/23 1419   02/05/23 1416  For home use only DME Bedside commode  Once       Comments: Pt is Non Weight Bearing to Rt Lower Leg with Generalized Weakness and Decreased Activity Tolerance necessitate recommendation for bedside commode.  Question:  Patient needs a bedside commode to treat with the following condition  Answer:  Weakness   02/05/23 1419

## 2023-02-05 NOTE — Discharge Planning (Signed)
[facility] Patient Discharge Orders - [facility] CLINIC: [facility]  Patient's name: [name] Admit/DC Dates: 01/27/2023 - 02/05/2023  DISCHARGE DIAGNOSES: Septic shock -> required short-term pressors Proteus bacteremia -> TDC removed, new L femoral line - see below R foot MRSA osteomyelitis -> s/p debridement 8/25, wound vac in place ESRD   HD ORDER CHANGES: Heparin change: no EDW Change: no *Got volume up here after line holiday and TDC issues, last weight 143kg* Bath Change: no  ANEMIA MANAGEMENT: Aranesp: Given: yes   Amount/Date of last dose: Aranesp on 8/22 ESA dose for discharge: mircera 200 mcg IV q 2 weeks, to start on 8/29 IV Iron dose at discharge: not while on IV abx Transfusion: Given: no  BONE/MINERAL MEDICATIONS: Hectorol/Calcitriol change: no Sensipar/Parsabiv change: no (Parsabiv not given during admit, non-formulary)  ACCESS INTERVENTION/CHANGE: yes - Prior TDC removed, new line placed by IR 8/22 - non-functional, so then underwent revascularization of L iliac vein with new placement on 8/26 - thankfully this is working well.  RECENT LABS: Recent Labs  Lab 02/05/23 0510  HGB 8.4*  NA 130*  K 3.9  CALCIUM 9.2  PHOS 7.6*  ALBUMIN 3.1*    IV ANTIBIOTICS: yes 1 - Cefazolin 2g q HD until 9/8 - this is for the proteus bacteremia 2 - Vancomycin 1g q HD until 10/5 - this is for MRSA foot osteomyelitis  OTHER ANTICOAGULATION: On Coumadin?: no  OTHER/APPTS/LAB ORDERS: 1 - Mood very low this admit, started on Zoloft - pls have SW follow-up with him 2 - Pls reschedule [facility] appt - needs to be seen after finishing the above abx for re-consideration of HeRO AVG placement 3 - Make sure getting ONSP - alb low this admit 4 - Discharge with wound vac -> needs f/u with podiatry in 1 week, will have HH do VAC changes  D/C Meds to be reconciled by nurse after every discharge.  Completed By: Ozzie Hoyle, PA-C [facility] Pager [phone]   Reviewed by: MD:______ RN_______

## 2023-02-05 NOTE — Progress Notes (Signed)
DISCHARGE NOTE HOME [name] to be discharged Home per MD order. Discussed prescriptions and follow up appointments with the patient. Prescriptions given to patient; medication list explained in detail. Patient verbalized understanding.  Wound vac in place to R lower leg and he is aware of phone numbers to call for [facility]. IV catheter discontinued intact. Site without signs and symptoms of complications. Dressing and pressure applied. Pt denies pain at the site currently. No complaints noted.  Patient free of lines, drains, and wounds.   An After Visit Summary (AVS) was printed and given to the patient. Patient escorted via wheelchair, and discharged home via private auto.  Irwin Brakeman, RN

## 2023-02-05 NOTE — Progress Notes (Signed)
    Durable Medical Equipment  (From admission, onward)           Start     Ordered   02/05/23 1421  For home use only DME Negative pressure wound device  Once       Question Answer Comment  Frequency of dressing change 3 times per week   Length of need 6 Months   Dressing type Foam   Amount of suction 125 mm/Hg   Pressure application Continuous pressure   Supplies 10 canisters and 15 dressings per month for duration of therapy      02/05/23 1421   02/05/23 1418  For home use only DME lightweight manual wheelchair with seat cushion  Once       Comments: Patient suffers from Rt Lower Leg wound with NWB restrictions which impairs their ability to perform daily activities like dressing and toileting in the home.  A cane or walker will not resolve  issue with performing activities of daily living. A wheelchair will allow patient to safely perform daily activities. Patient is not able to propel themselves in the home using a standard weight wheelchair due to endurance and general weakness. Patient can self propel in the lightweight wheelchair. Length of need 12 months . Accessories: elevating leg rests (ELRs), wheel locks, extensions and anti-tippers.   02/05/23 1419   02/05/23 1416  For home use only DME Bedside commode  Once       Comments: Pt is Non Weight Bearing to Rt Lower Leg with Generalized Weakness and Decreased Activity Tolerance necessitate recommendation for bedside commode.  Question:  Patient needs a bedside commode to treat with the following condition  Answer:  Weakness   02/05/23 1419

## 2023-02-05 NOTE — Progress Notes (Addendum)
[facility] Progress Note   Subjective:  Seen in room - coming to HD shortly. Denies CP/dyspnea. Looks like foot wound grew MRSA - awaiting final abx recommendations from ID prior to discharge.  Objective Vitals:   02/05/23 0614 02/05/23 0950 02/05/23 0951 02/05/23 1010  BP: 110/80  130/87 134/85  Pulse: 75  74 71  Resp: 18  13 14   Temp: 98.4 F (36.9 C)  98.1 F (36.7 C)   TempSrc: Oral  Oral   SpO2: 100%  100% 100%  Weight:  (!) 143.4 kg    Height:       Physical Exam General: Well appearing man, NAD, room air Heart: RRR; no murmur Lungs: CTA anteriorly Abdomen: obese, soft, non-tender Extremities: No LE edema; R foot bandaged with wound vac Dialysis Access: L femoral TDC  Additional Objective Labs: Basic Metabolic Panel: Recent Labs  Lab 02/03/23 1905 02/04/23 0438 02/05/23 0510  NA 132* 131* 130*  K 3.7 4.0 3.9  CL 93* 95* 92*  CO2 24 22 20*  GLUCOSE 94 81 81  BUN 42* 48* 55*  CREATININE 11.62* 12.85* 14.85*  CALCIUM 8.6* 8.6* 9.2  PHOS 5.6* 6.5* 7.6*   Liver Function Tests: Recent Labs  Lab 02/03/23 1905 02/04/23 0438 02/05/23 0510  ALBUMIN 2.8* 2.9* 3.1*   CBC: Recent Labs  Lab 01/30/23 0505 01/31/23 0818 02/01/23 0949 02/02/23 0810 02/05/23 0510  WBC 11.1* 8.9 8.4 7.7 7.3  HGB 8.2* 7.9* 8.3* 8.2* 8.4*  HCT 25.5* 24.4* 25.5* 24.9* 26.4*  MCV 84.7 84.4 86.1 85.6 86.0  PLT 303 306 316 328 360   Blood Culture    Component Value Date/Time   SDES WOUND 02/01/2023 1508   SPECREQUEST right foot 02/01/2023 1508   CULT  02/01/2023 1508    RARE METHICILLIN RESISTANT STAPHYLOCOCCUS AUREUS NO ANAEROBES ISOLATED; CULTURE IN PROGRESS FOR 5 DAYS    REPTSTATUS PENDING 02/01/2023 1508   Medications:  sodium chloride Stopped (01/30/23 2358)   anticoagulant sodium citrate      ceFAZolin (ANCEF) IV 1 g (02/04/23 2237)   vancomycin 1,000 mg (02/03/23 1545)    (feeding supplement) PROSource Plus  30 mL Oral BID BM   apixaban  5 mg  Oral BID   Chlorhexidine Gluconate Cloth  6 each Topical Q0600   darbepoetin (ARANESP) injection - DIALYSIS  200 mcg Subcutaneous Q Thu-1800   ferric citrate  630 mg Oral TID WC   metroNIDAZOLE  500 mg Oral Q12H   midodrine  15 mg Oral TID WC    Dialysis Orders: TTS at [facility] 4.5hr, 500/500, EDW 137kg, 2K/2Ca bath, TDC L femoral, heparin 5000 initial + 2500 mid run bolus - Parsabiv 7.5mg  IV q HD - Mircera IV q 2 weeks - last 8/6   Assessment/Plan: Proteus bacteremia: Blood Cx 8/20 positive (1 of 2), repeat 8/22 negative. Source felt either [facility] or foot wound. Old TDC removed and new one replaced by IR 8/22 but was non-functional. ID following - on Cefazolin, Vancomycin, Metronidazole. Dialysis access failure: Running out of options unfortunately, has been on HD ~20 years. Known central venous disease/SVC stenosis. Appreciate IR assistance - s/p revascularization to L iliac vein and new TDC placed 8/26 which is working well. Long-term plan is for HeRO graft once done with antibiotic course. Chronic R foot wound: Osteomyelitis s/p R toe amputation 7/5, not healing. S/p foot debridement 8/25, Cx growing MRSA. ESRD: Usual TTS schedule - line issues as above, now back on track. For HD  today. Hypotension/volume: Chronically low BP on midodrine 15mg  TID - volume up, 4L UFG planned. Anemia of ESRD: Hgb 8.4 - stable, continue Aranesp 150 mg q Thursday while here. Secondary HTPH: CorrCa ok, Phos high - continue Auryxia as binder. Parsabiv not formulary here. Nutrition: Alb 3.1 - continue supps. pAF: Eliquis has been resumed. OSA Adjustment disorder: Mood low/depressed in setting of line issues with HD. Follow closely. Sertraline started this admit. Dispo: Ok from renal standpoint once abx course defined.  Ozzie Hoyle, PA-C 02/05/2023, 10:44 AM  [facility]   Seen and examined independently this AM.  Agree with note and exam as documented above by physician  extender and as noted here.  Seen and examined on dialysis.  Procedure supervised.  Blood pressure 114/82 and HR 66.  Tolerating goal.  Left Fem dialysis catheter is in use and working well    Estanislado Emms, MD 02/05/2023 10:59 AM

## 2023-02-06 ENCOUNTER — Telehealth: Payer: Self-pay | Admitting: Nephrology

## 2023-02-06 ENCOUNTER — Telehealth: Payer: Self-pay | Admitting: Family Medicine

## 2023-02-06 ENCOUNTER — Telehealth: Payer: Self-pay

## 2023-02-06 DIAGNOSIS — A4102 Sepsis due to Methicillin resistant Staphylococcus aureus: Secondary | ICD-10-CM | POA: Diagnosis not present

## 2023-02-06 DIAGNOSIS — R7303 Prediabetes: Secondary | ICD-10-CM | POA: Diagnosis not present

## 2023-02-06 DIAGNOSIS — R6521 Severe sepsis with septic shock: Secondary | ICD-10-CM | POA: Diagnosis not present

## 2023-02-06 DIAGNOSIS — T827XXD Infection and inflammatory reaction due to other cardiac and vascular devices, implants and grafts, subsequent encounter: Secondary | ICD-10-CM | POA: Diagnosis not present

## 2023-02-06 DIAGNOSIS — Z4781 Encounter for orthopedic aftercare following surgical amputation: Secondary | ICD-10-CM | POA: Diagnosis not present

## 2023-02-06 DIAGNOSIS — I5032 Chronic diastolic (congestive) heart failure: Secondary | ICD-10-CM | POA: Diagnosis not present

## 2023-02-06 DIAGNOSIS — I480 Paroxysmal atrial fibrillation: Secondary | ICD-10-CM | POA: Diagnosis not present

## 2023-02-06 DIAGNOSIS — M86471 Chronic osteomyelitis with draining sinus, right ankle and foot: Secondary | ICD-10-CM | POA: Diagnosis not present

## 2023-02-06 DIAGNOSIS — I132 Hypertensive heart and chronic kidney disease with heart failure and with stage 5 chronic kidney disease, or end stage renal disease: Secondary | ICD-10-CM | POA: Diagnosis not present

## 2023-02-06 LAB — AEROBIC/ANAEROBIC CULTURE W GRAM STAIN (SURGICAL/DEEP WOUND)

## 2023-02-06 NOTE — Telephone Encounter (Signed)
Transition of Care Contact from Inpatient Facility   Date of Discharge: 02/05/23 Date of Contact: 02/06/23 Method of contact: phone Talked to patient   Patient contacted to discuss transition of care form recent hospitaliztion. Patient was admitted to [facility] from 01/27/23 to 02/05/23 with the discharge diagnosis of Septic shock, proteus bacteremia, R foot MRSA OM.     Medication changes were reviewed.  Patient will follow up with is outpatient dialysis center tomorrow.  Other follow up needs include none identified.    Virgina Norfolk, PA-C [employer]

## 2023-02-06 NOTE — Telephone Encounter (Signed)
[name] from [facility] called [phone] and would like to request orders for the pt   1 week 9  Right great toe amputated  2 PRN

## 2023-02-06 NOTE — Transitions of Care (Post Inpatient/ED Visit) (Signed)
02/06/2023  Name: [name] MRN: [id number] DOB: 04-25-1976  Today's TOC FU Call Status: Today's TOC FU Call Status:: Successful TOC FU Call Completed TOC FU Call Complete Date: 02/06/23 Patient's Name and Date of Birth confirmed.  Transition Care Management Follow-up Telephone Call Date of Discharge: 02/05/23 Discharge Facility: [facility]) Type of Discharge: Inpatient Admission Primary Inpatient Discharge Diagnosis:: Sepsis How have you been since you were released from the hospital?: Better Any questions or concerns?: No  Items Reviewed: Did you receive and understand the discharge instructions provided?: Yes Medications obtained,verified, and reconciled?: Yes (Medications Reviewed) Any new allergies since your discharge?: No Dietary orders reviewed?: No Do you have support at home?: Yes People in Home: parent(s) Name of Support/Comfort Primary Source: [name]  Medications Reviewed Today: Medications Reviewed Today     Reviewed by Robert Gross, RN (Case Manager) on 02/06/23 at 1140  Med List Status: <None>   Medication Order Taking? Sig Documenting Provider Last Dose Status Informant  apixaban (ELIQUIS) 5 MG TABS tablet [id number] Yes Take 5 mg by mouth 2 (two) times daily. [provider] Taking Active Self  atorvastatin (LIPITOR) 40 MG tablet [id number] Yes Take 1 tablet (40 mg total) by mouth daily. [name], MD Taking Active Self           Med Note [name]   Wed Dec 31, 2022  4:29 PM)    B Complex-C-Zn-Folic Acid (DIALYVITE/ZINC) TABS [id number] Yes Take 1 tablet by mouth daily. [provider] Taking Active Self           Med Note [name], [name] A   Thu May 15, 2022  9:18 AM)    Calcium Carb-Cholecalciferol (CALCIUM 600 + D PO) [id number] Yes Take 2 tablets by mouth daily. [provider] Taking Active Self  ceFAZolin (ANCEF) IVPB [id number] Yes Inject 2 g into the vein Every Tuesday,Thursday,and Saturday with dialysis  for 11 days. Indication:  Proteus TDC infection Last Day of Therapy:  02/16/23 (last dose 02/14/23 with Saturday HD) Labs - Once weekly:  CBC/D and BMP, Labs - Once weekly: ESR and CRP Method of administration: Per hemodialysis protocol at the HD center Robert Fraction, MD Taking Active   ferric citrate (AURYXIA) 1 GM 210 MG(Fe) tablet [id number] Yes Take 210 mg by mouth 3 (three) times daily with meals. [provider] Taking Active Self           Med Note [name], [name]   Wed Dec 31, 2022  4:33 PM)    metroNIDAZOLE (FLAGYL) 500 MG tablet [id number] Yes Take 1 tablet (500 mg total) by mouth 2 (two) times daily. Robert Fraction, MD Taking Active   midodrine (PROAMATINE) 5 MG tablet [id number] Yes Take 15 mg by mouth 3 (three) times daily. [provider] Taking Active Self  vancomycin IVPB [id number] Yes Inject 1,000 mg into the vein Every Tuesday,Thursday,and Saturday with dialysis. Indication:  MRSA R-foot osteo Last Day of Therapy:  03/15/23 (last dose Sat, 03/14/23 with HD) Labs - Sunday/Monday:  CBC/D, BMP, and vancomycin trough. Labs - Once weekly: ESR and CRP, pre-HD Vancomycin level Method of administration: Per HD protocol at the HD center Robert Fraction, MD Taking Active             Home Care and Equipment/Supplies: Were Home Health Services Ordered?: Yes Name of Home Health Agency:: [facility] Has Agency set up a time to come to your home?: Yes First Home Health Visit Date: 02/06/23 Any new equipment or medical  supplies ordered?: Yes Name of Medical supply agency?: [employer] [employer] [employer] [facility] Were you able to get the equipment/medical supplies?: Yes Do you have any questions related to the use of the equipment/supplies?: No  Functional Questionnaire: Do you need assistance with bathing/showering or dressing?: Yes Do you need assistance with meal preparation?: Yes Do you need assistance with eating?: No Do you have difficulty maintaining  continence: No Do you need assistance with getting out of bed/getting out of a chair/moving?: Yes Do you have difficulty managing or taking your medications?: No  Follow up appointments reviewed: PCP Follow-up appointment confirmed?: NA Specialist Hospital Follow-up appointment confirmed?: Yes Date of Specialist follow-up appointment?: 02/11/23 Follow-Up Specialty Provider:: Louann Sjogren (wound) Do you need transportation to your follow-up appointment?: No Do you understand care options if your condition(s) worsen?: Yes-patient verbalized understanding  SDOH Interventions Today    Flowsheet Row Most Recent Value  SDOH Interventions   Food Insecurity Interventions Intervention Not Indicated  Utilities Interventions Intervention Not Indicated     Robert Gross RN, BSN, CCM [facility] RN Care Coordinator/ Transitions of Care Direct Dial: [phone]  Fax: [phone]

## 2023-02-07 DIAGNOSIS — N041 Nephrotic syndrome with focal and segmental glomerular lesions: Secondary | ICD-10-CM | POA: Diagnosis not present

## 2023-02-07 DIAGNOSIS — N186 End stage renal disease: Secondary | ICD-10-CM | POA: Diagnosis not present

## 2023-02-07 DIAGNOSIS — N2581 Secondary hyperparathyroidism of renal origin: Secondary | ICD-10-CM | POA: Diagnosis not present

## 2023-02-07 DIAGNOSIS — Z992 Dependence on renal dialysis: Secondary | ICD-10-CM | POA: Diagnosis not present

## 2023-02-10 DIAGNOSIS — R7881 Bacteremia: Secondary | ICD-10-CM | POA: Diagnosis not present

## 2023-02-10 DIAGNOSIS — I480 Paroxysmal atrial fibrillation: Secondary | ICD-10-CM | POA: Diagnosis not present

## 2023-02-10 DIAGNOSIS — N186 End stage renal disease: Secondary | ICD-10-CM | POA: Diagnosis not present

## 2023-02-10 DIAGNOSIS — N2581 Secondary hyperparathyroidism of renal origin: Secondary | ICD-10-CM | POA: Diagnosis not present

## 2023-02-10 DIAGNOSIS — R6521 Severe sepsis with septic shock: Secondary | ICD-10-CM | POA: Diagnosis not present

## 2023-02-10 DIAGNOSIS — Z992 Dependence on renal dialysis: Secondary | ICD-10-CM | POA: Diagnosis not present

## 2023-02-10 NOTE — Telephone Encounter (Signed)
Mrs. Huston Foley checking on status of this message. Please advise her at [phone]

## 2023-02-10 NOTE — Telephone Encounter (Signed)
Lft VM to rtn call. Dm/cma  

## 2023-02-10 NOTE — TOC Progression Note (Signed)
Transition of Care [facility]) - Progression Note    Patient Details  Name: [name] MRN: [id number] Date of Birth: 1975/06/17  Transition [facility] Care [facility]) CM/SW Contact  Tom-Johnson, Hershal Coria, RN Phone Number: 02/10/2023, 10:16 AM  Clinical Narrative:     CM received a call from Mitch with [employer] that patient declined giving his bank card to be placed on file for them to deliver Wound Vac. CM called patient and he states he has an appointment with MD tomorrow and will know then if he needs it or not and then hung up the phone.   No further needs from [facility] at this time.         Barriers to Discharge: Barriers Resolved  Expected Discharge Plan and Services         Expected Discharge Date: 02/05/23               DME Arranged: Bedside commode, Lightweight manual wheelchair with seat cushion, Vac DME Agency: [facility] Date DME Agency Contacted: 02/05/23 Time DME Agency Contacted: [phone] Representative spoke with at DME Agency: [facility] HH Arranged: RN, Social Work (Wound Care and Wound Vac changes) HH Agency: [facility] Date [facility] Agency Contacted: 02/05/23 Time HH Agency Contacted: [phone] Representative spoke with at [facility] Agency: Tresa Endo   Social Determinants of Health (SDOH) Interventions SDOH Screenings   Food Insecurity: No Food Insecurity (02/06/2023)  Housing: Patient Declined (02/02/2023)  Transportation Needs: Unmet Transportation Needs (02/02/2023)  Utilities: Not At Risk (02/06/2023)  Alcohol Screen: Low Risk  (12/29/2022)  Depression (PHQ2-9): Low Risk  (01/14/2023)  Financial Resource Strain: Low Risk  (12/29/2022)  Physical Activity: Insufficiently Active (12/29/2022)  Social Connections: Socially Isolated (12/29/2022)  Stress: No Stress Concern Present (12/29/2022)  Tobacco Use: Medium Risk (02/01/2023)  Health Literacy: Adequate Health Literacy (12/29/2022)    Readmission Risk Interventions    02/05/2023    2:24 PM 12/16/2022    2:42 PM   Readmission Risk Prevention Plan  Transportation Screening Complete Complete  PCP or Specialist Appt within 3-5 Days  Complete  HRI or Home Care Consult  Complete  Social Work Consult for Recovery Care Planning/Counseling  Complete  Palliative Care Screening  Not Applicable  Medication Review Ocean[facility]) Referral to Pharmacy Complete  PCP or Specialist appointment within 3-5 days of discharge Complete   HRI or Home Care Consult Complete   SW Recovery Care/Counseling Consult Complete   Palliative Care Screening Not Applicable   Skilled Nursing Facility Not Applicable

## 2023-02-11 ENCOUNTER — Ambulatory Visit: Payer: Medicare HMO | Admitting: Podiatry

## 2023-02-11 ENCOUNTER — Encounter: Payer: Self-pay | Admitting: Podiatry

## 2023-02-11 DIAGNOSIS — T8130XA Disruption of wound, unspecified, initial encounter: Secondary | ICD-10-CM

## 2023-02-11 DIAGNOSIS — L97512 Non-pressure chronic ulcer of other part of right foot with fat layer exposed: Secondary | ICD-10-CM | POA: Diagnosis not present

## 2023-02-11 DIAGNOSIS — E08621 Diabetes mellitus due to underlying condition with foot ulcer: Secondary | ICD-10-CM

## 2023-02-11 DIAGNOSIS — Z9889 Other specified postprocedural states: Secondary | ICD-10-CM | POA: Diagnosis not present

## 2023-02-11 NOTE — Telephone Encounter (Signed)
LT VM to rtn call. Dm/cma

## 2023-02-11 NOTE — Telephone Encounter (Signed)
[name] from [facility] is needing a cb at [phone] concerning this issue. Ptease advise

## 2023-02-11 NOTE — Progress Notes (Signed)
Subjective:  Patient ID: [name], male    DOB: [birth year],  MRN: [id number]  Chief Complaint  Patient presents with   Wound Check    F/ u appointment    DOS: 02/01/23 Procedure: Right foot irrigation and debridement with application of graft and wound vac  47 y.o. male returns for POV#1. Relates doing well and managing pain. Wound vac has not been changed since discharge from hospital.   Review of Systems: Negative except as noted in the HPI. Denies N/V/F/Ch.  Past Medical History:  Diagnosis Date   Acute respiratory failure with hypoxia (HCC) 11/30/2018   Anemia    ESRD   ESRD on hemodialysis (HCC) 06/07/2011   TTS [place] [facility]. Started HD in 2006, got transplant in 2014 lasted until Dec 2019 then went back on HD.  Has L thigh AVG as of Jun 2020.  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    History of blood transfusion    Hypertension    03/19/22- has not had high blood pressure in 5 years.   Morbid obesity (HCC)    Paroxysmal atrial fibrillation (HCC)    Pre-diabetes    no meds   Sepsis (HCC) 11/30/2018   Sleep apnea    lost weight, does not use CPAP    Current Outpatient Medications:    apixaban (ELIQUIS) 5 MG TABS tablet, Take 5 mg by mouth 2 (two) times daily., Disp: , Rfl:    atorvastatin (LIPITOR) 40 MG tablet, Take 1 tablet (40 mg total) by mouth daily., Disp: 90 tablet, Rfl: 3   B Complex-C-Zn-Folic Acid (DIALYVITE/ZINC) TABS, Take 1 tablet by mouth daily., Disp: , Rfl:    Calcium Carb-Cholecalciferol (CALCIUM 600 + D PO), Take 2 tablets by mouth daily., Disp: , Rfl:    ceFAZolin (ANCEF) IVPB, Inject 2 g into the vein Every Tuesday,Thursday,and Saturday with dialysis for 11 days. Indication:  Proteus TDC infection Last Day of Therapy:  02/16/23 (last dose 02/14/23 with Saturday HD) Labs - Once weekly:  CBC/D and BMP, Labs - Once weekly: ESR and CRP Method of administration: Per hemodialysis protocol at the HD center, Disp: , Rfl:    ferric citrate  (AURYXIA) 1 GM 210 MG(Fe) tablet, Take 210 mg by mouth 3 (three) times daily with meals., Disp: , Rfl:    metroNIDAZOLE (FLAGYL) 500 MG tablet, Take 1 tablet (500 mg total) by mouth 2 (two) times daily., Disp: 76 tablet, Rfl: 0   midodrine (PROAMATINE) 5 MG tablet, Take 15 mg by mouth 3 (three) times daily., Disp: , Rfl:    vancomycin IVPB, Inject 1,000 mg into the vein Every Tuesday,Thursday,and Saturday with dialysis. Indication:  MRSA R-foot osteo Last Day of Therapy:  03/15/23 (last dose Sat, 03/14/23 with HD) Labs - Sunday/Monday:  CBC/D, BMP, and vancomycin trough. Labs - Once weekly: ESR and CRP, pre-HD Vancomycin level Method of administration: Per HD protocol at the HD center, Disp: , Rfl:   Social History   Tobacco Use  Smoking Status Former   Current packs/day: 0.00   Types: Cigarettes   Quit date: 11/08/2015   Years since quitting: 7.2  Smokeless Tobacco Never    No Known Allergies Objective:  There were no vitals filed for this visit. There is no height or weight on file to calculate BMI. Constitutional Well developed. Well nourished.  Vascular Foot warm and well perfused. Capillary refill normal to all digits.   Neurologic Normal speech. Oriented to person, place, and time. Epicritic sensation  to light touch grossly present bilaterally.  Dermatologic Wound bed healthy with underlying granular tissue and mild maceration noted around wound bed with graft intact.   Orthopedic: Tenderness to palpation noted about the surgical site.    Assessment:   1. Post-operative state   2. Wound dehiscence   3. Diabetic ulcer of other part of right foot associated with diabetes mellitus due to underlying condition, with fat layer exposed (HCC)    Plan:  Patient was evaluated and treated and all questions answered.  S/p foot surgery right -Progressing as expected post-operatively. -WB Status: WBAT in surgical shoe -Sutures: intact and wound vac and graft intact. -Medications:  n/a -Foot redressed. With bedatine dressing. Wound vac to be reapplied by home health. Continue twice weekly wound vac changes   Return in 2 weeks for recheck.   No follow-ups on file.

## 2023-02-12 ENCOUNTER — Telehealth: Payer: Self-pay | Admitting: Podiatry

## 2023-02-12 DIAGNOSIS — N186 End stage renal disease: Secondary | ICD-10-CM | POA: Diagnosis not present

## 2023-02-12 DIAGNOSIS — Z4781 Encounter for orthopedic aftercare following surgical amputation: Secondary | ICD-10-CM | POA: Diagnosis not present

## 2023-02-12 DIAGNOSIS — T827XXD Infection and inflammatory reaction due to other cardiac and vascular devices, implants and grafts, subsequent encounter: Secondary | ICD-10-CM | POA: Diagnosis not present

## 2023-02-12 DIAGNOSIS — M86471 Chronic osteomyelitis with draining sinus, right ankle and foot: Secondary | ICD-10-CM | POA: Diagnosis not present

## 2023-02-12 DIAGNOSIS — R7303 Prediabetes: Secondary | ICD-10-CM | POA: Diagnosis not present

## 2023-02-12 DIAGNOSIS — I5032 Chronic diastolic (congestive) heart failure: Secondary | ICD-10-CM | POA: Diagnosis not present

## 2023-02-12 DIAGNOSIS — N2581 Secondary hyperparathyroidism of renal origin: Secondary | ICD-10-CM | POA: Diagnosis not present

## 2023-02-12 DIAGNOSIS — A4102 Sepsis due to Methicillin resistant Staphylococcus aureus: Secondary | ICD-10-CM | POA: Diagnosis not present

## 2023-02-12 DIAGNOSIS — I132 Hypertensive heart and chronic kidney disease with heart failure and with stage 5 chronic kidney disease, or end stage renal disease: Secondary | ICD-10-CM | POA: Diagnosis not present

## 2023-02-12 DIAGNOSIS — Z992 Dependence on renal dialysis: Secondary | ICD-10-CM | POA: Diagnosis not present

## 2023-02-12 DIAGNOSIS — R6521 Severe sepsis with septic shock: Secondary | ICD-10-CM | POA: Diagnosis not present

## 2023-02-12 DIAGNOSIS — I480 Paroxysmal atrial fibrillation: Secondary | ICD-10-CM | POA: Diagnosis not present

## 2023-02-12 NOTE — Telephone Encounter (Signed)
Patient states he was seen yesterday in the office and had previously had a wound vac placed on his foot in the hospital.  A dressing was applied yesterday and he went home.  He stated that today, someone delivered a new wound vac, but didn't put it on him, it didn't come with instructions, and he doesn't feel comfortable trying to figure it out.  Plus, he doesn't think he can get to his foot to apply the device.  He asked the person who delivered it if he'd put it on, but he said he just delivers it and left.  The patient is requesting a call back, and someone to come and put the wound vac on his foot.  Callback [phone]

## 2023-02-12 NOTE — Telephone Encounter (Signed)
Called [name] at [facility] and gave Verbal okay for orders.  Then called and spoke to patinet.  He states that if he is okay why should he have to come in, I advised that we need to see him at least once a year.  He scheduled an appointment for 02/18/23 @ 2:20 pm.  Dm/cma

## 2023-02-14 DIAGNOSIS — N186 End stage renal disease: Secondary | ICD-10-CM | POA: Diagnosis not present

## 2023-02-14 DIAGNOSIS — N2581 Secondary hyperparathyroidism of renal origin: Secondary | ICD-10-CM | POA: Diagnosis not present

## 2023-02-14 DIAGNOSIS — Z992 Dependence on renal dialysis: Secondary | ICD-10-CM | POA: Diagnosis not present

## 2023-02-17 DIAGNOSIS — N2581 Secondary hyperparathyroidism of renal origin: Secondary | ICD-10-CM | POA: Diagnosis not present

## 2023-02-17 DIAGNOSIS — Z992 Dependence on renal dialysis: Secondary | ICD-10-CM | POA: Diagnosis not present

## 2023-02-17 DIAGNOSIS — I5032 Chronic diastolic (congestive) heart failure: Secondary | ICD-10-CM | POA: Diagnosis not present

## 2023-02-17 DIAGNOSIS — I132 Hypertensive heart and chronic kidney disease with heart failure and with stage 5 chronic kidney disease, or end stage renal disease: Secondary | ICD-10-CM | POA: Diagnosis not present

## 2023-02-17 DIAGNOSIS — N186 End stage renal disease: Secondary | ICD-10-CM | POA: Diagnosis not present

## 2023-02-17 DIAGNOSIS — A4102 Sepsis due to Methicillin resistant Staphylococcus aureus: Secondary | ICD-10-CM | POA: Diagnosis not present

## 2023-02-17 DIAGNOSIS — Z4781 Encounter for orthopedic aftercare following surgical amputation: Secondary | ICD-10-CM | POA: Diagnosis not present

## 2023-02-17 DIAGNOSIS — R6521 Severe sepsis with septic shock: Secondary | ICD-10-CM | POA: Diagnosis not present

## 2023-02-17 DIAGNOSIS — M86471 Chronic osteomyelitis with draining sinus, right ankle and foot: Secondary | ICD-10-CM | POA: Diagnosis not present

## 2023-02-17 DIAGNOSIS — I480 Paroxysmal atrial fibrillation: Secondary | ICD-10-CM | POA: Diagnosis not present

## 2023-02-17 DIAGNOSIS — T827XXD Infection and inflammatory reaction due to other cardiac and vascular devices, implants and grafts, subsequent encounter: Secondary | ICD-10-CM | POA: Diagnosis not present

## 2023-02-17 DIAGNOSIS — R7303 Prediabetes: Secondary | ICD-10-CM | POA: Diagnosis not present

## 2023-02-18 ENCOUNTER — Encounter: Payer: Self-pay | Admitting: Family Medicine

## 2023-02-18 ENCOUNTER — Ambulatory Visit (INDEPENDENT_AMBULATORY_CARE_PROVIDER_SITE_OTHER): Payer: Medicare HMO | Admitting: Family Medicine

## 2023-02-18 VITALS — BP 118/74 | HR 99 | Temp 97.6°F | Ht 74.0 in | Wt 312.0 lb

## 2023-02-18 DIAGNOSIS — L02611 Cutaneous abscess of right foot: Secondary | ICD-10-CM

## 2023-02-18 DIAGNOSIS — L03031 Cellulitis of right toe: Secondary | ICD-10-CM

## 2023-02-18 DIAGNOSIS — I480 Paroxysmal atrial fibrillation: Secondary | ICD-10-CM

## 2023-02-18 DIAGNOSIS — N186 End stage renal disease: Secondary | ICD-10-CM | POA: Diagnosis not present

## 2023-02-18 DIAGNOSIS — Z992 Dependence on renal dialysis: Secondary | ICD-10-CM | POA: Diagnosis not present

## 2023-02-18 DIAGNOSIS — E782 Mixed hyperlipidemia: Secondary | ICD-10-CM

## 2023-02-18 DIAGNOSIS — E1122 Type 2 diabetes mellitus with diabetic chronic kidney disease: Secondary | ICD-10-CM | POA: Diagnosis not present

## 2023-02-18 DIAGNOSIS — Z89411 Acquired absence of right great toe: Secondary | ICD-10-CM | POA: Insufficient documentation

## 2023-02-18 DIAGNOSIS — R7989 Other specified abnormal findings of blood chemistry: Secondary | ICD-10-CM | POA: Diagnosis not present

## 2023-02-18 DIAGNOSIS — I10 Essential (primary) hypertension: Secondary | ICD-10-CM | POA: Diagnosis not present

## 2023-02-18 DIAGNOSIS — E1129 Type 2 diabetes mellitus with other diabetic kidney complication: Secondary | ICD-10-CM

## 2023-02-18 NOTE — Assessment & Plan Note (Signed)
At goal. We will check his A1c today.

## 2023-02-18 NOTE — Assessment & Plan Note (Signed)
Stable. Rate well controlled. Continue apixaban 5 mg bid.

## 2023-02-18 NOTE — Assessment & Plan Note (Signed)
I will check lipids today. Continue atorvastatin 40 mg daily.

## 2023-02-18 NOTE — Assessment & Plan Note (Addendum)
Continue to work with nephrology and dialysis team.

## 2023-02-18 NOTE — Assessment & Plan Note (Signed)
Continue current antibiotics and wound care.

## 2023-02-18 NOTE — Assessment & Plan Note (Signed)
Blood pressure is in good control. Has had hypotension at times and remains on midodrine 5 mg daily.

## 2023-02-18 NOTE — Progress Notes (Signed)
[facility] PRIMARY CARE [facility] [address] [place] [address] Dept: [phone] Dept Fax: [phone]  Chronic Care Office Visit  Subjective:    Patient ID: [name], male    DOB: Oct 02, 1975, 47 y.o..   MRN: [id number]  Chief Complaint  Patient presents with   Follow-up    F/u.  Recently had Rt big toe amputated.           History of Present Illness:  Patient is in today for reassessment of chronic medical issues.  [name] has a history of ESRD, prior kidney transplant which failed, and is on chronic hemodialysis three days a week. He primarily follows with his nephrologist/dialysis team.  [name] was admitted at [facility] from 8/20-8/29/2024 with sepsis scondary to a diabetic foot infection and chronic osteomyelitis. He underwent operative debridement. He currently is using a wound VAC for assistance with closure. He is also on IV vancomycin and metronidazole 500 mg twice daily. He reports that the lesion appears to be healing.  [name] has a history of Type 2 diabetes which no longer requires treatment due to his ESRD. He has also had hyperlipidemia. He is currently on atorvastatin 40 mg daily.  Past Medical History: Patient Active Problem List   Diagnosis Date Noted   Status post amputation of great toe, right (HCC) 02/18/2023   Non-healing wound of lower extremity 01/30/2023   Chronic osteomyelitis of right foot with draining sinus (HCC) 01/30/2023   Chronic anticoagulation 01/28/2023   Gram-negative bacteremia 01/28/2023   ESRD on hemodialysis (HCC) 01/28/2023   Acute osteomyelitis of right foot (HCC) 12/06/2022   Colon cancer screening 05/20/2022   Melanosis coli 05/20/2022   Grade I internal hemorrhoids 05/20/2022   Polyp of rectum 05/20/2022   Thrombus of venous dialysis catheter, initial encounter (HCC) 07/15/2021   Hyperkalemia 07/15/2021   Other mechanical complication of surgically created arteriovenous  shunt, initial encounter (HCC) 04/26/2021   Complication of vascular dialysis catheter 02/18/2021   Peripheral neuropathy 02/06/2021   Secondary hyperparathyroidism of renal origin (HCC) 12/07/2020   Hyperlipidemia 12/06/2020   CHF (congestive heart failure) (HCC)    Tobacco use    Marijuana use, continuous    Debility 12/13/2018   Medically noncompliant    Genital warts 11/03/2018   Meralgia paresthetica of right side 08/13/2018   Paroxysmal atrial fibrillation (HCC) 05/24/2018   Type 2 diabetes mellitus with other diabetic kidney complication (HCC) 05/24/2018   Essential hypertension 01/25/2017   Morbid obesity (HCC) 01/17/2016   Delayed graft function of kidney transplant due to reason other than ATN or rejection requiring acute dialysis (HCC) 11/02/2012   S/P kidney transplant 11/02/2012   OSA (obstructive sleep apnea) 05/26/2011   Past Surgical History:  Procedure Laterality Date   ALLOGRAFT APPLICATION Right 02/01/2023   Procedure: Jena Gauss [facility];  Surgeon: Edwin Cap, DPM;  Location: [facility] OR;  Service: Orthopedics/Podiatry;  Laterality: Right;   AMPUTATION Right 12/12/2022   Procedure: AMPUTATION RAY;  Surgeon: Louann Sjogren, DPM;  Location: [facility] OR;  Service: Orthopedics/Podiatry;  Laterality: Right;  surgical team to do local block   APPLICATION OF WOUND VAC Right 02/01/2023   Procedure: APPLICATION OF WOUND VAC;  Surgeon: Edwin Cap, DPM;  Location: [facility] OR;  Service: Orthopedics/Podiatry;  Laterality: Right;   ARTERIOVENOUS GRAFT PLACEMENT  08/09/2010   Left Thigh Graft by Dr. Cari Caraway   AV FISTULA PLACEMENT Right 05/18/2018   Procedure: INSERTION OF ARTERIOVENOUS (AV) GORE-TEX GRAFT Left THIGH;  Surgeon:  Maeola Harman, MD;  Location: [facility] OR;  Service: Vascular;  Laterality: Right;   AV FISTULA PLACEMENT Right 02/15/2021   Procedure: INSERTION OF ARTERIOVENOUS (AV) GORE-TEX LOOP GRAFT RIGHT THIGH;  Surgeon: Maeola Harman, MD;   Location: [facility] OR;  Service: Vascular;  Laterality: Right;   BIOPSY  05/20/2022   Procedure: BIOPSY;  Surgeon: Shellia Cleverly, DO;  Location: [facility];  Service: Gastroenterology;;   CAPD INSERTION N/A 06/13/2022   Procedure: LAPAROSCOPIC INSERTION CONTINUOUS AMBULATORY PERITONEAL DIALYSIS  (CAPD) CATHETER;  Surgeon: Maeola Harman, MD;  Location: [facility] OR;  Service: Vascular;  Laterality: N/A;   CAPD REMOVAL  05/08/2011   Procedure: CONTINUOUS AMBULATORY PERITONEAL DIALYSIS  (CAPD) CATHETER REMOVAL;  Surgeon: Iona Coach, MD;  Location: [facility] OR;  Service: General;  Laterality: N/A;  Removal of CAPD catheter, Dr. requests to go after 100   CAPD REMOVAL N/A 08/08/2022   Procedure: REMOVAL CONTINUOUS AMBULATORY PERITONEAL DIALYSIS  (CAPD) CATHETER;  Surgeon: Maeola Harman, MD;  Location: [facility] OR;  Service: Vascular;  Laterality: N/A;   COLONOSCOPY WITH PROPOFOL N/A 05/20/2022   Procedure: COLONOSCOPY WITH PROPOFOL;  Surgeon: Shellia Cleverly, DO;  Location: [facility];  Service: Gastroenterology;  Laterality: N/A;   I & D EXTREMITY Right 12/08/2022   Procedure: IRRIGATION AND DEBRIDEMENT RIGHT FOOT;  Surgeon: Louann Sjogren, DPM;  Location: [facility] OR;  Service: Orthopedics/Podiatry;  Laterality: Right;   I & D EXTREMITY Right 02/01/2023   Procedure: IRRIGATION AND DEBRIDEMENT RIGHT FOOT;  Surgeon: Edwin Cap, DPM;  Location: [facility] OR;  Service: Orthopedics/Podiatry;  Laterality: Right;   INSERTION OF DIALYSIS CATHETER  10/05/2010   Right Femoral Cath insertion by Dr. Leonides Sake.  Pt ahas had several caths inserted.   INSERTION OF DIALYSIS CATHETER Right 02/15/2021   Procedure: Attempted INSERTION OF RIGHT and Left Internal Jugular DIALYSIS CATHETER, Insertion of Left Femoral Vein Dialysis Catheter;  Surgeon: Maeola Harman, MD;  Location: [facility] OR;  Service: Vascular;  Laterality: Right;   INSERTION OF DIALYSIS CATHETER Right 04/26/2021   Procedure: INSERTION OF  TUNNELED 55cm PALIDROME PRECISION CHRONIC DIALYSIS CATHETER;  Surgeon: Leonie Douglas, MD;  Location: [facility] OR;  Service: Vascular;  Laterality: Right;   INSERTION OF DIALYSIS CATHETER Left 12/26/2021   Procedure: INSERTION OF TUNNELED  DIALYSIS CATHETER LEFT FEMORAL ARTERY;  Surgeon: Maeola Harman, MD;  Location: [facility] OR;  Service: Vascular;  Laterality: Left;   INSERTION OF DIALYSIS CATHETER Left 03/06/2022   Procedure: INSERTION OF DIALYSIS CATHETER;  Surgeon: Maeola Harman, MD;  Location: [facility] OR;  Service: Vascular;  Laterality: Left;   INSERTION OF DIALYSIS CATHETER Left 12/31/2022   Procedure: INSERTION OF DIALYSIS CATHETER LEFT GROIN;  Surgeon: Leonie Douglas, MD;  Location: [facility] OR;  Service: Vascular;  Laterality: Left;   IR FLUORO GUIDE CV LINE LEFT  04/23/2022   IR FLUORO GUIDE CV LINE LEFT  01/29/2023   IR FLUORO GUIDE CV LINE LEFT  02/02/2023   IR FLUORO GUIDE CV LINE RIGHT  05/11/2018   IR FLUORO GUIDE CV LINE RIGHT  07/03/2021   IR FLUORO GUIDE CV LINE RIGHT  07/16/2021   IR PTA ADDL CENTRAL DIALYSIS SEG THRU DIALY CIRCUIT RIGHT Right 07/03/2021   IR PTA AND STENT ADDL CENTRAL DIALY SEG THRU DIALY CIRCUIT LEFT Left 02/02/2023   IR US GUIDE VASC ACCESS LEFT  04/23/2022   IR US GUIDE VASC ACCESS RIGHT  05/11/2018   IR VENO/EXT/UNI LEFT  04/23/2022  IR VENO/EXT/UNI LEFT  01/29/2023   IR VENOCAVAGRAM IVC  07/16/2021   IR VENOCAVAGRAM IVC  02/02/2023   KIDNEY TRANSPLANT  2014   KNEE ARTHROSCOPY Left    LAPAROSCOPIC LYSIS OF ADHESIONS N/A 06/13/2022   Procedure: LAPAROSCOPIC LYSIS OF ADHESIONS;  Surgeon: Maeola Harman, MD;  Location: [facility] OR;  Service: Vascular;  Laterality: N/A;   POLYPECTOMY  05/20/2022   Procedure: POLYPECTOMY;  Surgeon: Shellia Cleverly, DO;  Location: [facility];  Service: Gastroenterology;;   THROMBECTOMY W/ EMBOLECTOMY Right 04/26/2021   Procedure: REMOVAL OF RIGHT THIGH ARTERIOVENOUS GORE-TEX GRAFT AND REPAIR OF RIGHT COMMON FEMORAL  ARTERY;  Surgeon: Leonie Douglas, MD;  Location: [facility] OR;  Service: Vascular;  Laterality: Right;   UPPER EXTREMITY ANGIOGRAPHY Bilateral 05/13/2018   Procedure: UPPER EXTREMITY ANGIOGRAPHY - bilarteral;  Surgeon: Cephus Shelling, MD;  Location: [facility] INVASIVE CV LAB;  Service: Cardiovascular;  Laterality: Bilateral;   Family History  Problem Relation Age of Onset   Diabetes Father    Stroke Father    Stroke Maternal Grandmother    Anesthesia problems Neg Hx    Outpatient Medications Prior to Visit  Medication Sig Dispense Refill   apixaban (ELIQUIS) 5 MG TABS tablet Take 5 mg by mouth 2 (two) times daily.     atorvastatin (LIPITOR) 40 MG tablet Take 1 tablet (40 mg total) by mouth daily. 90 tablet 3   B Complex-C-Zn-Folic Acid (DIALYVITE/ZINC) TABS Take 1 tablet by mouth daily.     Calcium Carb-Cholecalciferol (CALCIUM 600 + D PO) Take 2 tablets by mouth daily.     ferric citrate (AURYXIA) 1 GM 210 MG(Fe) tablet Take 210 mg by mouth 3 (three) times daily with meals.     metroNIDAZOLE (FLAGYL) 500 MG tablet Take 1 tablet (500 mg total) by mouth 2 (two) times daily. 76 tablet 0   Tenapanor HCl, CKD, (XPHOZAH) 30 MG TABS Take 30 mg by mouth in the morning and at bedtime.     vancomycin IVPB Inject 1,000 mg into the vein Every Tuesday,Thursday,and Saturday with dialysis. Indication:  MRSA R-foot osteo Last Day of Therapy:  03/15/23 (last dose Sat, 03/14/23 with HD) Labs - Sunday/Monday:  CBC/D, BMP, and vancomycin trough. Labs - Once weekly: ESR and CRP, pre-HD Vancomycin level Method of administration: Per HD protocol at the HD center     midodrine (PROAMATINE) 5 MG tablet Take 15 mg by mouth 3 (three) times daily. (Patient not taking: Reported on 02/18/2023)     No facility-administered medications prior to visit.   No Known Allergies Objective:   Today's Vitals   02/18/23 1426  BP: 118/74  Pulse: 99  Temp: 97.6 F (36.4 C)  TempSrc: Temporal  SpO2: 98%  Weight: (!) 312 lb  (141.5 kg)  Height: 6\' 2"  (1.88 m)   Body mass index is 40.06 kg/m.   General: Well developed, well nourished. No acute distress. Foot: Wound VAC in place on the right. The left foot shows general dryness/scaliness with some   callus formation around the left great toe and significant nail changes.Marland Kitchen Psych: Alert and oriented. Normal mood and affect.  Health Maintenance Due  Topic Date Due   Hepatitis C Screening  Never done   DTaP/Tdap/Td (1 - Tdap) Never done     Assessment & Plan:   Problem List Items Addressed This Visit       Cardiovascular and Mediastinum   Essential hypertension - Primary    Blood pressure is in good control. Has  had hypotension at times and remains on midodrine 5 mg daily.      Paroxysmal atrial fibrillation (HCC)    Stable. Rate well controlled. Continue apixaban 5 mg bid.        Endocrine   Type 2 diabetes mellitus with other diabetic kidney complication (HCC)    At goal. We will check his A1c today.      Relevant Orders   Hemoglobin A1c     Genitourinary   ESRD on hemodialysis (HCC)    Continue to work with nephrology and dialysis team.        Other   Cellulitis and abscess of toe of right foot    Continue current antibiotics and wound care.      Hyperlipidemia    I will check lipids today. Continue atorvastatin 40 mg daily.      Relevant Orders   Lipid panel   Other Visit Diagnoses     Elevated TSH       Prior history of "hypothyroidism", but suspect this was more of a sick euthyroid issue. I will recheck his TSH and T4 today.   Relevant Orders   TSH   T4, free       Return in about 1 year (around 02/18/2024) for Reassessment.   [name], MD

## 2023-02-19 DIAGNOSIS — N2581 Secondary hyperparathyroidism of renal origin: Secondary | ICD-10-CM | POA: Diagnosis not present

## 2023-02-19 DIAGNOSIS — Z992 Dependence on renal dialysis: Secondary | ICD-10-CM | POA: Diagnosis not present

## 2023-02-19 DIAGNOSIS — N186 End stage renal disease: Secondary | ICD-10-CM | POA: Diagnosis not present

## 2023-02-19 LAB — LIPID PANEL
Cholesterol: 97 mg/dL (ref 0–200)
HDL: 51 mg/dL (ref >39.00)
LDL Cholesterol: 34 mg/dL (ref 0–99)
NonHDL: 45.59
Total CHOL/HDL Ratio: 2
Triglycerides: 60 mg/dL (ref 0.0–149.0)
VLDL: 12 mg/dL (ref 0.0–40.0)

## 2023-02-19 LAB — HEMOGLOBIN A1C: Hgb A1c MFr Bld: 5.2 % (ref 4.6–6.5)

## 2023-02-19 LAB — T4, FREE: Free T4: 1.09 ng/dL (ref 0.60–1.60)

## 2023-02-19 LAB — TSH: TSH: 4.04 u[IU]/mL (ref 0.35–5.50)

## 2023-02-20 ENCOUNTER — Other Ambulatory Visit: Payer: Self-pay

## 2023-02-20 DIAGNOSIS — E782 Mixed hyperlipidemia: Secondary | ICD-10-CM

## 2023-02-20 MED ORDER — ATORVASTATIN CALCIUM 40 MG PO TABS
40.0000 mg | ORAL_TABLET | Freq: Every day | ORAL | 3 refills | Status: DC
Start: 2023-02-20 — End: 2023-12-22

## 2023-02-21 DIAGNOSIS — N2581 Secondary hyperparathyroidism of renal origin: Secondary | ICD-10-CM | POA: Diagnosis not present

## 2023-02-21 DIAGNOSIS — Z992 Dependence on renal dialysis: Secondary | ICD-10-CM | POA: Diagnosis not present

## 2023-02-21 DIAGNOSIS — N186 End stage renal disease: Secondary | ICD-10-CM | POA: Diagnosis not present

## 2023-02-24 ENCOUNTER — Inpatient Hospital Stay: Payer: Medicare HMO | Admitting: Internal Medicine

## 2023-02-24 DIAGNOSIS — Z4781 Encounter for orthopedic aftercare following surgical amputation: Secondary | ICD-10-CM | POA: Diagnosis not present

## 2023-02-24 DIAGNOSIS — N186 End stage renal disease: Secondary | ICD-10-CM | POA: Diagnosis not present

## 2023-02-24 DIAGNOSIS — T827XXD Infection and inflammatory reaction due to other cardiac and vascular devices, implants and grafts, subsequent encounter: Secondary | ICD-10-CM | POA: Diagnosis not present

## 2023-02-24 DIAGNOSIS — I480 Paroxysmal atrial fibrillation: Secondary | ICD-10-CM | POA: Diagnosis not present

## 2023-02-24 DIAGNOSIS — M86471 Chronic osteomyelitis with draining sinus, right ankle and foot: Secondary | ICD-10-CM | POA: Diagnosis not present

## 2023-02-24 DIAGNOSIS — I5032 Chronic diastolic (congestive) heart failure: Secondary | ICD-10-CM | POA: Diagnosis not present

## 2023-02-24 DIAGNOSIS — R6521 Severe sepsis with septic shock: Secondary | ICD-10-CM | POA: Diagnosis not present

## 2023-02-24 DIAGNOSIS — R7303 Prediabetes: Secondary | ICD-10-CM | POA: Diagnosis not present

## 2023-02-24 DIAGNOSIS — Z992 Dependence on renal dialysis: Secondary | ICD-10-CM | POA: Diagnosis not present

## 2023-02-24 DIAGNOSIS — A4102 Sepsis due to Methicillin resistant Staphylococcus aureus: Secondary | ICD-10-CM | POA: Diagnosis not present

## 2023-02-24 DIAGNOSIS — N2581 Secondary hyperparathyroidism of renal origin: Secondary | ICD-10-CM | POA: Diagnosis not present

## 2023-02-24 DIAGNOSIS — I132 Hypertensive heart and chronic kidney disease with heart failure and with stage 5 chronic kidney disease, or end stage renal disease: Secondary | ICD-10-CM | POA: Diagnosis not present

## 2023-02-25 ENCOUNTER — Ambulatory Visit (INDEPENDENT_AMBULATORY_CARE_PROVIDER_SITE_OTHER): Payer: Medicare HMO | Admitting: Podiatry

## 2023-02-25 ENCOUNTER — Encounter: Payer: Self-pay | Admitting: Podiatry

## 2023-02-25 DIAGNOSIS — Z9889 Other specified postprocedural states: Secondary | ICD-10-CM

## 2023-02-25 DIAGNOSIS — L97512 Non-pressure chronic ulcer of other part of right foot with fat layer exposed: Secondary | ICD-10-CM

## 2023-02-25 DIAGNOSIS — T8130XA Disruption of wound, unspecified, initial encounter: Secondary | ICD-10-CM | POA: Diagnosis not present

## 2023-02-25 DIAGNOSIS — E08621 Diabetes mellitus due to underlying condition with foot ulcer: Secondary | ICD-10-CM | POA: Diagnosis not present

## 2023-02-25 NOTE — Progress Notes (Signed)
Subjective:  Patient ID: [name], male    DOB: Sep 06, 1975,  MRN: [id number]  Chief Complaint  Patient presents with   Routine Post Op    DENIES N/V/F, NO PAIN - HAS WOUND VAC SINCE LAST TUESDAY    DOS: 02/01/23 Procedure: Right foot irrigation and debridement with application of graft and wound vac  47 y.o. male returns for POV#2. Relates doing well and managing pain.   Review of Systems: Negative except as noted in the HPI. Denies N/V/F/Ch.  Past Medical History:  Diagnosis Date   Acute respiratory failure with hypoxia (HCC) 11/30/2018   Anemia    ESRD   ESRD on hemodialysis (HCC) 06/07/2011   TTS [facility]. Started HD in 2006, got transplant in 2014 lasted until Dec 2019 then went back on HD.  Has L thigh AVG as of Jun 2020.  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    History of blood transfusion    Hypertension    03/19/22- has not had high blood pressure in 5 years.   Morbid obesity (HCC)    Paroxysmal atrial fibrillation (HCC)    Pre-diabetes    no meds   Sepsis (HCC) 11/30/2018   Sleep apnea    lost weight, does not use CPAP    Current Outpatient Medications:    apixaban (ELIQUIS) 5 MG TABS tablet, Take 5 mg by mouth 2 (two) times daily., Disp: , Rfl:    atorvastatin (LIPITOR) 40 MG tablet, Take 1 tablet (40 mg total) by mouth daily., Disp: 90 tablet, Rfl: 3   B Complex-C-Zn-Folic Acid (DIALYVITE/ZINC) TABS, Take 1 tablet by mouth daily., Disp: , Rfl:    Calcium Carb-Cholecalciferol (CALCIUM 600 + D PO), Take 2 tablets by mouth daily., Disp: , Rfl:    ferric citrate (AURYXIA) 1 GM 210 MG(Fe) tablet, Take 210 mg by mouth 3 (three) times daily with meals., Disp: , Rfl:    metroNIDAZOLE (FLAGYL) 500 MG tablet, Take 1 tablet (500 mg total) by mouth 2 (two) times daily., Disp: 76 tablet, Rfl: 0   midodrine (PROAMATINE) 5 MG tablet, Take 15 mg by mouth 3 (three) times daily. (Patient not taking: Reported on 02/18/2023), Disp: , Rfl:    Tenapanor HCl,  CKD, (XPHOZAH) 30 MG TABS, Take 30 mg by mouth in the morning and at bedtime., Disp: , Rfl:    vancomycin IVPB, Inject 1,000 mg into the vein Every Tuesday,Thursday,and Saturday with dialysis. Indication:  MRSA R-foot osteo Last Day of Therapy:  03/15/23 (last dose Sat, 03/14/23 with HD) Labs - Sunday/Monday:  CBC/D, BMP, and vancomycin trough. Labs - Once weekly: ESR and CRP, pre-HD Vancomycin level Method of administration: Per HD protocol at the HD center, Disp: , Rfl:   Social History   Tobacco Use  Smoking Status Former   Current packs/day: 0.00   Types: Cigarettes   Quit date: 11/08/2015   Years since quitting: 7.3  Smokeless Tobacco Never    No Known Allergies Objective:  There were no vitals filed for this visit. There is no height or weight on file to calculate BMI. Constitutional Well developed. Well nourished.  Vascular Foot warm and well perfused. Capillary refill normal to all digits.   Neurologic Normal speech. Oriented to person, place, and time. Epicritic sensation to light touch grossly present bilaterally.  Dermatologic Wound bed healthy with underlying granular tissue and mild maceration noted around wound bed with graft intact.   No concerns noted on the left foot.  Orthopedic: Tenderness to palpation noted about the surgical site.    Assessment:   1. Post-operative state   2. Wound dehiscence   3. Diabetic ulcer of other part of right foot associated with diabetes mellitus due to underlying condition, with fat layer exposed (HCC)     Plan:  Patient was evaluated and treated and all questions answered.  S/p foot surgery right -Progressing as expected post-operatively. -WB Status: WBAT in surgical shoe -Sutures: intact and wound vac and graft intact. Staples removed and wound vac reapplied.  -Medications: n/a -Foot redressed. With bedatine dressing.  Continue twice weekly wound vac changes   Return in 2 weeks for recheck.   Return in about 2 weeks  (around 03/11/2023) for wound check.

## 2023-02-26 DIAGNOSIS — N2581 Secondary hyperparathyroidism of renal origin: Secondary | ICD-10-CM | POA: Diagnosis not present

## 2023-02-26 DIAGNOSIS — Z992 Dependence on renal dialysis: Secondary | ICD-10-CM | POA: Diagnosis not present

## 2023-02-26 DIAGNOSIS — N186 End stage renal disease: Secondary | ICD-10-CM | POA: Diagnosis not present

## 2023-02-28 DIAGNOSIS — Z992 Dependence on renal dialysis: Secondary | ICD-10-CM | POA: Diagnosis not present

## 2023-02-28 DIAGNOSIS — N186 End stage renal disease: Secondary | ICD-10-CM | POA: Diagnosis not present

## 2023-02-28 DIAGNOSIS — N2581 Secondary hyperparathyroidism of renal origin: Secondary | ICD-10-CM | POA: Diagnosis not present

## 2023-03-03 DIAGNOSIS — N186 End stage renal disease: Secondary | ICD-10-CM | POA: Diagnosis not present

## 2023-03-03 DIAGNOSIS — Z992 Dependence on renal dialysis: Secondary | ICD-10-CM | POA: Diagnosis not present

## 2023-03-03 DIAGNOSIS — N2581 Secondary hyperparathyroidism of renal origin: Secondary | ICD-10-CM | POA: Diagnosis not present

## 2023-03-04 ENCOUNTER — Telehealth: Payer: Self-pay | Admitting: Family Medicine

## 2023-03-04 ENCOUNTER — Ambulatory Visit: Payer: Self-pay

## 2023-03-04 DIAGNOSIS — I480 Paroxysmal atrial fibrillation: Secondary | ICD-10-CM | POA: Diagnosis not present

## 2023-03-04 DIAGNOSIS — R6521 Severe sepsis with septic shock: Secondary | ICD-10-CM | POA: Diagnosis not present

## 2023-03-04 DIAGNOSIS — Z4781 Encounter for orthopedic aftercare following surgical amputation: Secondary | ICD-10-CM | POA: Diagnosis not present

## 2023-03-04 DIAGNOSIS — A4102 Sepsis due to Methicillin resistant Staphylococcus aureus: Secondary | ICD-10-CM | POA: Diagnosis not present

## 2023-03-04 DIAGNOSIS — R7303 Prediabetes: Secondary | ICD-10-CM | POA: Diagnosis not present

## 2023-03-04 DIAGNOSIS — T827XXD Infection and inflammatory reaction due to other cardiac and vascular devices, implants and grafts, subsequent encounter: Secondary | ICD-10-CM | POA: Diagnosis not present

## 2023-03-04 DIAGNOSIS — M86471 Chronic osteomyelitis with draining sinus, right ankle and foot: Secondary | ICD-10-CM | POA: Diagnosis not present

## 2023-03-04 DIAGNOSIS — I132 Hypertensive heart and chronic kidney disease with heart failure and with stage 5 chronic kidney disease, or end stage renal disease: Secondary | ICD-10-CM | POA: Diagnosis not present

## 2023-03-04 DIAGNOSIS — D631 Anemia in chronic kidney disease: Secondary | ICD-10-CM

## 2023-03-04 DIAGNOSIS — I5032 Chronic diastolic (congestive) heart failure: Secondary | ICD-10-CM | POA: Diagnosis not present

## 2023-03-04 DIAGNOSIS — N186 End stage renal disease: Secondary | ICD-10-CM

## 2023-03-04 NOTE — Patient Instructions (Signed)
Visit Information  Thank you for taking time to visit with me today. Please don't hesitate to contact me if I can be of assistance to you.   Following are the goals we discussed today:   Goals Addressed             This Visit's Progress    Health Maintainance       Care Coordination Interventions: Evaluation of current treatment plan related to ESRD management and patient's adherence to plan as established by provider Discussed plans with patient for ongoing care management follow up and provided patient with direct contact information for care management team  Patient reports doing okay.  He reports right foot wound making progress. No dead tissue noted per patient with vac change today.  He reports hardly any drainage to vac now.  Remains on antibiotics with dialysis and oral flagyl.  Discussed signs of infection.  Dialysis port doing okay. Hero graft for dialysis on hold due to foot wound infection. Patient to call th reschedule surgery.  No concerns.                Our next appointment is by telephone on 04/01/23 at 200 pm  Please call the care guide team at [phone] if you need to cancel or reschedule your appointment.   If you are experiencing a Mental Health or Behavioral Health Crisis or need someone to talk to, please call the Suicide and Crisis Lifeline: 988   Patient verbalizes understanding of instructions and care plan provided today and agrees to view in MyChart. Active MyChart status and patient understanding of how to access instructions and care plan via MyChart confirmed with patient.     The patient has been provided with contact information for the care management team and has been advised to call with any health related questions or concerns.   Bary Leriche, RN, MSN [facility], [facility] Management Community Coordinator Direct Dial: [phone]  Fax: [phone] Website: [name]

## 2023-03-04 NOTE — Patient Outreach (Signed)
Care Coordination   Follow Up Visit Note   03/04/2023 Name: [name] MRN: [id number] DOB: 1975/11/20  Verdene Lennert Hoefling is a 47 y.o. year old male who sees Rudd, Bertram Millard, MD for primary care. I spoke with  Verdene Lennert Branscum by phone today.  What matters to the patients health and wellness today?  Right foot healing    Goals Addressed             This Visit's Progress    Health Maintainance       Care Coordination Interventions: Evaluation of current treatment plan related to ESRD management and patient's adherence to plan as established by provider Discussed plans with patient for ongoing care management follow up and provided patient with direct contact information for care management team  Patient reports doing okay.  He reports right foot wound making progress. No dead tissue noted per patient with vac change today.  He reports hardly any drainage to vac now.  Remains on antibiotics with dialysis and oral flagyl.  Discussed signs of infection.  Dialysis port doing okay. Hero graft for dialysis on hold due to foot wound infection. Patient to call th reschedule surgery.  No concerns.                SDOH assessments and interventions completed:  Yes  SDOH Interventions Today    Flowsheet Row Most Recent Value  SDOH Interventions   Transportation Interventions Intervention Not Indicated        Care Coordination Interventions:  Yes, provided   Follow up plan: Follow up call scheduled for October    Encounter Outcome:  Patient Visit Completed   Bary Leriche, RN, MSN [place]  [facility], [facility] Management Community Coordinator Direct Dial: [phone]  Fax: [phone] Website: [name]

## 2023-03-04 NOTE — Telephone Encounter (Signed)
Patient dropped off document  home health cert. , to be filled out by provider. Patient requested to send it back via Fax within 5-days. Document is located in providers tray at front office.Please advise at Mobile [phone] (mobile)   Home health order by fax. Put in dr box

## 2023-03-05 DIAGNOSIS — N2581 Secondary hyperparathyroidism of renal origin: Secondary | ICD-10-CM | POA: Diagnosis not present

## 2023-03-05 DIAGNOSIS — N186 End stage renal disease: Secondary | ICD-10-CM | POA: Diagnosis not present

## 2023-03-05 DIAGNOSIS — Z992 Dependence on renal dialysis: Secondary | ICD-10-CM | POA: Diagnosis not present

## 2023-03-05 NOTE — Telephone Encounter (Signed)
Form filled out and faxed back and then placed in [facility] office for billing. Dm/cma

## 2023-03-07 DIAGNOSIS — N2581 Secondary hyperparathyroidism of renal origin: Secondary | ICD-10-CM | POA: Diagnosis not present

## 2023-03-07 DIAGNOSIS — N186 End stage renal disease: Secondary | ICD-10-CM | POA: Diagnosis not present

## 2023-03-07 DIAGNOSIS — Z992 Dependence on renal dialysis: Secondary | ICD-10-CM | POA: Diagnosis not present

## 2023-03-09 DIAGNOSIS — N041 Nephrotic syndrome with focal and segmental glomerular lesions: Secondary | ICD-10-CM | POA: Diagnosis not present

## 2023-03-09 DIAGNOSIS — N186 End stage renal disease: Secondary | ICD-10-CM | POA: Diagnosis not present

## 2023-03-09 DIAGNOSIS — Z992 Dependence on renal dialysis: Secondary | ICD-10-CM | POA: Diagnosis not present

## 2023-03-10 DIAGNOSIS — N186 End stage renal disease: Secondary | ICD-10-CM | POA: Diagnosis not present

## 2023-03-10 DIAGNOSIS — N2581 Secondary hyperparathyroidism of renal origin: Secondary | ICD-10-CM | POA: Diagnosis not present

## 2023-03-10 DIAGNOSIS — Z992 Dependence on renal dialysis: Secondary | ICD-10-CM | POA: Diagnosis not present

## 2023-03-11 ENCOUNTER — Ambulatory Visit (INDEPENDENT_AMBULATORY_CARE_PROVIDER_SITE_OTHER): Payer: Medicare HMO | Admitting: Podiatry

## 2023-03-11 DIAGNOSIS — Z9889 Other specified postprocedural states: Secondary | ICD-10-CM

## 2023-03-11 DIAGNOSIS — T8130XA Disruption of wound, unspecified, initial encounter: Secondary | ICD-10-CM

## 2023-03-11 DIAGNOSIS — L97512 Non-pressure chronic ulcer of other part of right foot with fat layer exposed: Secondary | ICD-10-CM

## 2023-03-11 DIAGNOSIS — E08621 Diabetes mellitus due to underlying condition with foot ulcer: Secondary | ICD-10-CM

## 2023-03-11 NOTE — Progress Notes (Signed)
Subjective:  Patient ID: [name], male    DOB: 10-23-1975,  MRN: [id number]  Chief Complaint  Patient presents with   Wound Check    DOS: 02/01/23 Procedure: Right foot irrigation and debridement with application of graft and wound vac  47 y.o. male returns for POV#3. Relates doing well and managing pain.   Review of Systems: Negative except as noted in the HPI. Denies N/V/F/Ch.  Past Medical History:  Diagnosis Date   Acute respiratory failure with hypoxia (HCC) 11/30/2018   Anemia    ESRD   ESRD on hemodialysis (HCC) 06/07/2011   TTS [facility]. Started HD in 2006, got transplant in 2014 lasted until Dec 2019 then went back on HD.  Has L thigh AVG as of Jun 2020.  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    History of blood transfusion    Hypertension    03/19/22- has not had high blood pressure in 5 years.   Morbid obesity (HCC)    Paroxysmal atrial fibrillation (HCC)    Pre-diabetes    no meds   Sepsis (HCC) 11/30/2018   Sleep apnea    lost weight, does not use CPAP    Current Outpatient Medications:    apixaban (ELIQUIS) 5 MG TABS tablet, Take 5 mg by mouth 2 (two) times daily., Disp: , Rfl:    atorvastatin (LIPITOR) 40 MG tablet, Take 1 tablet (40 mg total) by mouth daily., Disp: 90 tablet, Rfl: 3   B Complex-C-Zn-Folic Acid (DIALYVITE/ZINC) TABS, Take 1 tablet by mouth daily., Disp: , Rfl:    Calcium Carb-Cholecalciferol (CALCIUM 600 + D PO), Take 2 tablets by mouth daily., Disp: , Rfl:    ferric citrate (AURYXIA) 1 GM 210 MG(Fe) tablet, Take 210 mg by mouth 3 (three) times daily with meals., Disp: , Rfl:    metroNIDAZOLE (FLAGYL) 500 MG tablet, Take 1 tablet (500 mg total) by mouth 2 (two) times daily., Disp: 76 tablet, Rfl: 0   midodrine (PROAMATINE) 5 MG tablet, Take 15 mg by mouth 3 (three) times daily. (Patient not taking: Reported on 03/04/2023), Disp: , Rfl:    Tenapanor HCl, CKD, (XPHOZAH) 30 MG TABS, Take 30 mg by mouth in the morning and at  bedtime., Disp: , Rfl:    vancomycin IVPB, Inject 1,000 mg into the vein Every Tuesday,Thursday,and Saturday with dialysis. Indication:  MRSA R-foot osteo Last Day of Therapy:  03/15/23 (last dose Sat, 03/14/23 with HD) Labs - Sunday/Monday:  CBC/D, BMP, and vancomycin trough. Labs - Once weekly: ESR and CRP, pre-HD Vancomycin level Method of administration: Per HD protocol at the HD center, Disp: , Rfl:   Social History   Tobacco Use  Smoking Status Former   Current packs/day: 0.00   Types: Cigarettes   Quit date: 11/08/2015   Years since quitting: 7.3  Smokeless Tobacco Never    No Known Allergies Objective:  There were no vitals filed for this visit. There is no height or weight on file to calculate BMI. Constitutional Well developed. Well nourished.  Vascular Foot warm and well perfused. Capillary refill normal to all digits.   Neurologic Normal speech. Oriented to person, place, and time. Epicritic sensation to light touch grossly present bilaterally.  Dermatologic Wound bed healthy with underlying granular tissue and mild maceration noted around wound bed with graft intact. Wound about 0.4 cm in width  with proximal and distal dehiscence areas about 4 cm in length each with much improvement.    No  concerns noted on the left foot.   Orthopedic: Tenderness to palpation noted about the surgical site.    Assessment:   1. Wound dehiscence   2. Post-operative state   3. Diabetic ulcer of other part of right foot associated with diabetes mellitus due to underlying condition, with fat layer exposed (HCC)      Plan:  Patient was evaluated and treated and all questions answered.  S/p foot surgery right -Progressing as expected post-operatively. Wound filled in and nice granular base.  -Minimal debridement preformed today.  -WB Status: WBAT in surgical shoe -Medications: n/a -Foot redressed. With bedatine dressing.  Will switch to daily dressings with betadine and discontinue  vac.   Return in 2 weeks for recheck.   No follow-ups on file.

## 2023-03-12 DIAGNOSIS — N2581 Secondary hyperparathyroidism of renal origin: Secondary | ICD-10-CM | POA: Diagnosis not present

## 2023-03-12 DIAGNOSIS — Z992 Dependence on renal dialysis: Secondary | ICD-10-CM | POA: Diagnosis not present

## 2023-03-12 DIAGNOSIS — N186 End stage renal disease: Secondary | ICD-10-CM | POA: Diagnosis not present

## 2023-03-14 DIAGNOSIS — N186 End stage renal disease: Secondary | ICD-10-CM | POA: Diagnosis not present

## 2023-03-14 DIAGNOSIS — N2581 Secondary hyperparathyroidism of renal origin: Secondary | ICD-10-CM | POA: Diagnosis not present

## 2023-03-14 DIAGNOSIS — Z992 Dependence on renal dialysis: Secondary | ICD-10-CM | POA: Diagnosis not present

## 2023-03-17 DIAGNOSIS — N186 End stage renal disease: Secondary | ICD-10-CM | POA: Diagnosis not present

## 2023-03-17 DIAGNOSIS — Z992 Dependence on renal dialysis: Secondary | ICD-10-CM | POA: Diagnosis not present

## 2023-03-17 DIAGNOSIS — N2581 Secondary hyperparathyroidism of renal origin: Secondary | ICD-10-CM | POA: Diagnosis not present

## 2023-03-19 DIAGNOSIS — N186 End stage renal disease: Secondary | ICD-10-CM | POA: Diagnosis not present

## 2023-03-19 DIAGNOSIS — N2581 Secondary hyperparathyroidism of renal origin: Secondary | ICD-10-CM | POA: Diagnosis not present

## 2023-03-19 DIAGNOSIS — Z992 Dependence on renal dialysis: Secondary | ICD-10-CM | POA: Diagnosis not present

## 2023-03-21 DIAGNOSIS — N186 End stage renal disease: Secondary | ICD-10-CM | POA: Diagnosis not present

## 2023-03-21 DIAGNOSIS — Z992 Dependence on renal dialysis: Secondary | ICD-10-CM | POA: Diagnosis not present

## 2023-03-21 DIAGNOSIS — N2581 Secondary hyperparathyroidism of renal origin: Secondary | ICD-10-CM | POA: Diagnosis not present

## 2023-03-24 DIAGNOSIS — N2581 Secondary hyperparathyroidism of renal origin: Secondary | ICD-10-CM | POA: Diagnosis not present

## 2023-03-24 DIAGNOSIS — N186 End stage renal disease: Secondary | ICD-10-CM | POA: Diagnosis not present

## 2023-03-24 DIAGNOSIS — Z992 Dependence on renal dialysis: Secondary | ICD-10-CM | POA: Diagnosis not present

## 2023-03-25 ENCOUNTER — Ambulatory Visit (INDEPENDENT_AMBULATORY_CARE_PROVIDER_SITE_OTHER): Payer: Medicare HMO | Admitting: Podiatry

## 2023-03-25 ENCOUNTER — Other Ambulatory Visit: Payer: Self-pay | Admitting: Nephrology

## 2023-03-25 ENCOUNTER — Encounter: Payer: Self-pay | Admitting: Podiatry

## 2023-03-25 DIAGNOSIS — E1142 Type 2 diabetes mellitus with diabetic polyneuropathy: Secondary | ICD-10-CM

## 2023-03-25 DIAGNOSIS — I7090 Unspecified atherosclerosis: Secondary | ICD-10-CM

## 2023-03-25 DIAGNOSIS — M2041 Other hammer toe(s) (acquired), right foot: Secondary | ICD-10-CM

## 2023-03-25 DIAGNOSIS — T8130XA Disruption of wound, unspecified, initial encounter: Secondary | ICD-10-CM | POA: Diagnosis not present

## 2023-03-25 DIAGNOSIS — E08621 Diabetes mellitus due to underlying condition with foot ulcer: Secondary | ICD-10-CM

## 2023-03-25 DIAGNOSIS — L97512 Non-pressure chronic ulcer of other part of right foot with fat layer exposed: Secondary | ICD-10-CM

## 2023-03-25 DIAGNOSIS — M2042 Other hammer toe(s) (acquired), left foot: Secondary | ICD-10-CM

## 2023-03-25 NOTE — Progress Notes (Signed)
Subjective:  Patient ID: [name], male    DOB: [birth year],  MRN: [id number]  No chief complaint on file.   DOS: 02/01/23 Procedure: Right foot irrigation and debridement with application of graft and wound vac  47 y.o. male returns for POV#4. Relates doing well and managing pain. Wound is doing better.   Review of Systems: Negative except as noted in the HPI. Denies N/V/F/Ch.  Past Medical History:  Diagnosis Date   Acute respiratory failure with hypoxia (HCC) 11/30/2018   Anemia    ESRD   ESRD on hemodialysis (HCC) 06/07/2011   TTS [facility]. Started HD in 2006, got transplant in 2014 lasted until Dec 2019 then went back on HD.  Has L thigh AVG as of Jun 2020.  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    History of blood transfusion    Hypertension    03/19/22- has not had high blood pressure in 5 years.   Morbid obesity (HCC)    Paroxysmal atrial fibrillation (HCC)    Pre-diabetes    no meds   Sepsis (HCC) 11/30/2018   Sleep apnea    lost weight, does not use CPAP    Current Outpatient Medications:    apixaban (ELIQUIS) 5 MG TABS tablet, Take 5 mg by mouth 2 (two) times daily., Disp: , Rfl:    atorvastatin (LIPITOR) 40 MG tablet, Take 1 tablet (40 mg total) by mouth daily., Disp: 90 tablet, Rfl: 3   B Complex-C-Zn-Folic Acid (DIALYVITE/ZINC) TABS, Take 1 tablet by mouth daily., Disp: , Rfl:    Calcium Carb-Cholecalciferol (CALCIUM 600 + D PO), Take 2 tablets by mouth daily., Disp: , Rfl:    ferric citrate (AURYXIA) 1 GM 210 MG(Fe) tablet, Take 210 mg by mouth 3 (three) times daily with meals., Disp: , Rfl:    midodrine (PROAMATINE) 5 MG tablet, Take 15 mg by mouth 3 (three) times daily. (Patient not taking: Reported on 03/04/2023), Disp: , Rfl:    Tenapanor HCl, CKD, (XPHOZAH) 30 MG TABS, Take 30 mg by mouth in the morning and at bedtime., Disp: , Rfl:   Social History   Tobacco Use  Smoking Status Former   Current packs/day: 0.00   Types: Cigarettes    Quit date: 11/08/2015   Years since quitting: 7.3  Smokeless Tobacco Never    No Known Allergies Objective:  There were no vitals filed for this visit. There is no height or weight on file to calculate BMI. Constitutional Well developed. Well nourished.  Vascular Foot warm and well perfused. Capillary refill normal to all digits.   Neurologic Normal speech. Oriented to person, place, and time. Epicritic sensation to light touch grossly present bilaterally.  Dermatologic Wound bed healthy with underlying granular tissue and mild maceration noted around wound bed with graft intact. Wound mostly healed small 0.5 cm x 0.2 cm area still opened distal incision with granular base.  No concerns noted on the left foot.   Orthopedic: Tenderness to palpation noted about the surgical site.    Assessment:   1. Wound dehiscence   2. Diabetic ulcer of other part of right foot associated with diabetes mellitus due to underlying condition, with fat layer exposed (HCC)       Plan:  Patient was evaluated and treated and all questions answered.  S/p foot surgery right Ulcer right foot  -Debridement as below. -Dressed with betadine, DSD. -Off-loading with surgical shoe. -No abx indicated.  -Will start process for diabetic shoes.  -  Discussed glucose control and proper protein-rich diet.  -Discussed if any worsening redness, pain, fever or chills to call or may need to report to the emergency room. Patient expressed understanding.   Procedure: Excisional Debridement of Wound Rationale: Removal of non-viable soft tissue from the wound to promote healing.  Anesthesia: none Pre-Debridement Wound Measurements:Overlying callus  Post-Debridement Wound Measurements: 0.5 cm x 0.2 cm x 0.2 cm  Type of Debridement: Sharp Excisional Tissue Removed: Non-viable soft tissue Depth of Debridement: subcutaneous tissue. Technique: Sharp excisional debridement to bleeding, viable wound base.  Dressing:  Dry, sterile, compression dressing. Disposition: Patient tolerated procedure well. Patient to return in 2 week for follow-up.  No follow-ups on file.   Return in 2 weeks for recheck.   No follow-ups on file.

## 2023-03-26 DIAGNOSIS — N2581 Secondary hyperparathyroidism of renal origin: Secondary | ICD-10-CM | POA: Diagnosis not present

## 2023-03-26 DIAGNOSIS — Z992 Dependence on renal dialysis: Secondary | ICD-10-CM | POA: Diagnosis not present

## 2023-03-26 DIAGNOSIS — N186 End stage renal disease: Secondary | ICD-10-CM | POA: Diagnosis not present

## 2023-03-28 DIAGNOSIS — N2581 Secondary hyperparathyroidism of renal origin: Secondary | ICD-10-CM | POA: Diagnosis not present

## 2023-03-28 DIAGNOSIS — N186 End stage renal disease: Secondary | ICD-10-CM | POA: Diagnosis not present

## 2023-03-28 DIAGNOSIS — Z992 Dependence on renal dialysis: Secondary | ICD-10-CM | POA: Diagnosis not present

## 2023-03-31 DIAGNOSIS — N2581 Secondary hyperparathyroidism of renal origin: Secondary | ICD-10-CM | POA: Diagnosis not present

## 2023-03-31 DIAGNOSIS — N186 End stage renal disease: Secondary | ICD-10-CM | POA: Diagnosis not present

## 2023-03-31 DIAGNOSIS — Z992 Dependence on renal dialysis: Secondary | ICD-10-CM | POA: Diagnosis not present

## 2023-04-01 ENCOUNTER — Inpatient Hospital Stay (HOSPITAL_COMMUNITY): Payer: Medicare HMO

## 2023-04-01 ENCOUNTER — Inpatient Hospital Stay (HOSPITAL_COMMUNITY)
Admission: EM | Admit: 2023-04-01 | Discharge: 2023-04-06 | DRG: 314 | Disposition: A | Discharge status: Home / Self Care | Payer: Medicare HMO | Attending: Family Medicine | Admitting: Family Medicine

## 2023-04-01 ENCOUNTER — Other Ambulatory Visit: Payer: Self-pay

## 2023-04-01 ENCOUNTER — Encounter (HOSPITAL_COMMUNITY): Payer: Self-pay | Admitting: *Deleted

## 2023-04-01 ENCOUNTER — Ambulatory Visit: Payer: Self-pay

## 2023-04-01 DIAGNOSIS — R7881 Bacteremia: Secondary | ICD-10-CM | POA: Diagnosis not present

## 2023-04-01 DIAGNOSIS — N186 End stage renal disease: Secondary | ICD-10-CM | POA: Diagnosis not present

## 2023-04-01 DIAGNOSIS — A419 Sepsis, unspecified organism: Principal | ICD-10-CM | POA: Diagnosis present

## 2023-04-01 DIAGNOSIS — R0603 Acute respiratory distress: Secondary | ICD-10-CM | POA: Diagnosis not present

## 2023-04-01 DIAGNOSIS — Z823 Family history of stroke: Secondary | ICD-10-CM

## 2023-04-01 DIAGNOSIS — I82220 Acute embolism and thrombosis of inferior vena cava: Secondary | ICD-10-CM | POA: Diagnosis present

## 2023-04-01 DIAGNOSIS — Z6841 Body Mass Index (BMI) 40.0 and over, adult: Secondary | ICD-10-CM | POA: Diagnosis not present

## 2023-04-01 DIAGNOSIS — Z833 Family history of diabetes mellitus: Secondary | ICD-10-CM

## 2023-04-01 DIAGNOSIS — N250 Renal osteodystrophy: Secondary | ICD-10-CM | POA: Diagnosis not present

## 2023-04-01 DIAGNOSIS — Z89411 Acquired absence of right great toe: Secondary | ICD-10-CM

## 2023-04-01 DIAGNOSIS — Z79899 Other long term (current) drug therapy: Secondary | ICD-10-CM | POA: Diagnosis not present

## 2023-04-01 DIAGNOSIS — L97519 Non-pressure chronic ulcer of other part of right foot with unspecified severity: Secondary | ICD-10-CM | POA: Diagnosis present

## 2023-04-01 DIAGNOSIS — I4821 Permanent atrial fibrillation: Secondary | ICD-10-CM | POA: Diagnosis present

## 2023-04-01 DIAGNOSIS — G4733 Obstructive sleep apnea (adult) (pediatric): Secondary | ICD-10-CM | POA: Diagnosis present

## 2023-04-01 DIAGNOSIS — A4159 Other Gram-negative sepsis: Secondary | ICD-10-CM | POA: Diagnosis not present

## 2023-04-01 DIAGNOSIS — Z6839 Body mass index (BMI) 39.0-39.9, adult: Secondary | ICD-10-CM | POA: Diagnosis not present

## 2023-04-01 DIAGNOSIS — D631 Anemia in chronic kidney disease: Secondary | ICD-10-CM | POA: Diagnosis not present

## 2023-04-01 DIAGNOSIS — D696 Thrombocytopenia, unspecified: Secondary | ICD-10-CM | POA: Diagnosis present

## 2023-04-01 DIAGNOSIS — Z4901 Encounter for fitting and adjustment of extracorporeal dialysis catheter: Secondary | ICD-10-CM | POA: Diagnosis not present

## 2023-04-01 DIAGNOSIS — Z1152 Encounter for screening for COVID-19: Secondary | ICD-10-CM | POA: Diagnosis not present

## 2023-04-01 DIAGNOSIS — Y848 Other medical procedures as the cause of abnormal reaction of the patient, or of later complication, without mention of misadventure at the time of the procedure: Secondary | ICD-10-CM | POA: Diagnosis present

## 2023-04-01 DIAGNOSIS — T8612 Kidney transplant failure: Secondary | ICD-10-CM | POA: Diagnosis not present

## 2023-04-01 DIAGNOSIS — E66812 Obesity, class 2: Secondary | ICD-10-CM | POA: Diagnosis present

## 2023-04-01 DIAGNOSIS — R634 Abnormal weight loss: Secondary | ICD-10-CM | POA: Diagnosis present

## 2023-04-01 DIAGNOSIS — I739 Peripheral vascular disease, unspecified: Secondary | ICD-10-CM | POA: Diagnosis not present

## 2023-04-01 DIAGNOSIS — M1712 Unilateral primary osteoarthritis, left knee: Secondary | ICD-10-CM | POA: Diagnosis not present

## 2023-04-01 DIAGNOSIS — Z992 Dependence on renal dialysis: Secondary | ICD-10-CM

## 2023-04-01 DIAGNOSIS — Z87891 Personal history of nicotine dependence: Secondary | ICD-10-CM

## 2023-04-01 DIAGNOSIS — I9589 Other hypotension: Secondary | ICD-10-CM | POA: Diagnosis present

## 2023-04-01 DIAGNOSIS — I132 Hypertensive heart and chronic kidney disease with heart failure and with stage 5 chronic kidney disease, or end stage renal disease: Secondary | ICD-10-CM | POA: Diagnosis present

## 2023-04-01 DIAGNOSIS — R6521 Severe sepsis with septic shock: Secondary | ICD-10-CM

## 2023-04-01 DIAGNOSIS — E875 Hyperkalemia: Secondary | ICD-10-CM | POA: Diagnosis not present

## 2023-04-01 DIAGNOSIS — Z7901 Long term (current) use of anticoagulants: Secondary | ICD-10-CM

## 2023-04-01 DIAGNOSIS — Y830 Surgical operation with transplant of whole organ as the cause of abnormal reaction of the patient, or of later complication, without mention of misadventure at the time of the procedure: Secondary | ICD-10-CM | POA: Diagnosis present

## 2023-04-01 DIAGNOSIS — T80211A Bloodstream infection due to central venous catheter, initial encounter: Secondary | ICD-10-CM | POA: Diagnosis not present

## 2023-04-01 DIAGNOSIS — E871 Hypo-osmolality and hyponatremia: Secondary | ICD-10-CM | POA: Diagnosis present

## 2023-04-01 DIAGNOSIS — I5032 Chronic diastolic (congestive) heart failure: Secondary | ICD-10-CM | POA: Diagnosis present

## 2023-04-01 DIAGNOSIS — M898X9 Other specified disorders of bone, unspecified site: Secondary | ICD-10-CM | POA: Diagnosis present

## 2023-04-01 DIAGNOSIS — R7303 Prediabetes: Secondary | ICD-10-CM | POA: Diagnosis present

## 2023-04-01 DIAGNOSIS — R000 Tachycardia, unspecified: Secondary | ICD-10-CM | POA: Diagnosis not present

## 2023-04-01 DIAGNOSIS — R6520 Severe sepsis without septic shock: Secondary | ICD-10-CM | POA: Diagnosis not present

## 2023-04-01 DIAGNOSIS — M25462 Effusion, left knee: Secondary | ICD-10-CM | POA: Diagnosis not present

## 2023-04-01 DIAGNOSIS — R579 Shock, unspecified: Secondary | ICD-10-CM | POA: Diagnosis not present

## 2023-04-01 DIAGNOSIS — I120 Hypertensive chronic kidney disease with stage 5 chronic kidney disease or end stage renal disease: Secondary | ICD-10-CM | POA: Diagnosis not present

## 2023-04-01 DIAGNOSIS — I959 Hypotension, unspecified: Secondary | ICD-10-CM | POA: Diagnosis not present

## 2023-04-01 LAB — CBC WITH DIFFERENTIAL/PLATELET
Abs Immature Granulocytes: 0.05 10*3/uL (ref 0.00–0.07)
Basophils Absolute: 0 10*3/uL (ref 0.0–0.1)
Basophils Relative: 0 %
Eosinophils Absolute: 0 10*3/uL (ref 0.0–0.5)
Eosinophils Relative: 1 %
HCT: 32.8 % — ABNORMAL LOW (ref 39.0–52.0)
Hemoglobin: 10.4 g/dL — ABNORMAL LOW (ref 13.0–17.0)
Immature Granulocytes: 1 %
Lymphocytes Relative: 7 %
Lymphs Abs: 0.4 10*3/uL — ABNORMAL LOW (ref 0.7–4.0)
MCH: 27.5 pg (ref 26.0–34.0)
MCHC: 31.7 g/dL (ref 30.0–36.0)
MCV: 86.8 fL (ref 80.0–100.0)
Monocytes Absolute: 0.6 10*3/uL (ref 0.1–1.0)
Monocytes Relative: 12 %
Neutro Abs: 4.1 10*3/uL (ref 1.7–7.7)
Neutrophils Relative %: 79 %
Platelets: 115 10*3/uL — ABNORMAL LOW (ref 150–400)
RBC: 3.78 MIL/uL — ABNORMAL LOW (ref 4.22–5.81)
RDW: 17.2 % — ABNORMAL HIGH (ref 11.5–15.5)
WBC: 5.2 10*3/uL (ref 4.0–10.5)
nRBC: 0 % (ref 0.0–0.2)

## 2023-04-01 LAB — PROTIME-INR
INR: 1.9 — ABNORMAL HIGH (ref 0.8–1.2)
Prothrombin Time: 22.2 s — ABNORMAL HIGH (ref 11.4–15.2)

## 2023-04-01 LAB — BASIC METABOLIC PANEL
Anion gap: 18 — ABNORMAL HIGH (ref 5–15)
BUN: 89 mg/dL — ABNORMAL HIGH (ref 6–20)
CO2: 19 mmol/L — ABNORMAL LOW (ref 22–32)
Calcium: 8.8 mg/dL — ABNORMAL LOW (ref 8.9–10.3)
Chloride: 96 mmol/L — ABNORMAL LOW (ref 98–111)
Creatinine, Ser: 17.06 mg/dL — ABNORMAL HIGH (ref 0.61–1.24)
GFR, Estimated: 3 mL/min — ABNORMAL LOW (ref >60)
Glucose, Bld: 114 mg/dL — ABNORMAL HIGH (ref 70–99)
Potassium: 6 mmol/L — ABNORMAL HIGH (ref 3.5–5.1)
Sodium: 133 mmol/L — ABNORMAL LOW (ref 135–145)

## 2023-04-01 LAB — LACTIC ACID, PLASMA: Lactic Acid, Venous: 1 mmol/L (ref 0.5–1.9)

## 2023-04-01 LAB — I-STAT CG4 LACTIC ACID, ED: Lactic Acid, Venous: 2.3 mmol/L (ref 0.5–1.9)

## 2023-04-01 LAB — COMPREHENSIVE METABOLIC PANEL
ALT: 14 U/L (ref 0–44)
AST: 17 U/L (ref 15–41)
Albumin: 3.3 g/dL — ABNORMAL LOW (ref 3.5–5.0)
Alkaline Phosphatase: 68 U/L (ref 38–126)
Anion gap: 18 — ABNORMAL HIGH (ref 5–15)
BUN: 94 mg/dL — ABNORMAL HIGH (ref 6–20)
CO2: 17 mmol/L — ABNORMAL LOW (ref 22–32)
Calcium: 8.8 mg/dL — ABNORMAL LOW (ref 8.9–10.3)
Chloride: 97 mmol/L — ABNORMAL LOW (ref 98–111)
Creatinine, Ser: 17.66 mg/dL — ABNORMAL HIGH (ref 0.61–1.24)
GFR, Estimated: 3 mL/min — ABNORMAL LOW (ref >60)
Glucose, Bld: 103 mg/dL — ABNORMAL HIGH (ref 70–99)
Potassium: 6.7 mmol/L (ref 3.5–5.1)
Sodium: 132 mmol/L — ABNORMAL LOW (ref 135–145)
Total Bilirubin: 0.7 mg/dL (ref 0.3–1.2)
Total Protein: 7.2 g/dL (ref 6.5–8.1)

## 2023-04-01 LAB — RESP PANEL BY RT-PCR (RSV, FLU A&B, COVID)  RVPGX2
Influenza A by PCR: NEGATIVE
Influenza B by PCR: NEGATIVE
Resp Syncytial Virus by PCR: NEGATIVE
SARS Coronavirus 2 by RT PCR: NEGATIVE

## 2023-04-01 LAB — APTT: aPTT: 38 s — ABNORMAL HIGH (ref 24–36)

## 2023-04-01 LAB — MRSA NEXT GEN BY PCR, NASAL: MRSA by PCR Next Gen: DETECTED — AB

## 2023-04-01 LAB — CORTISOL: Cortisol, Plasma: 18.9 ug/dL

## 2023-04-01 MED ORDER — LIDOCAINE HCL (PF) 1 % IJ SOLN
5.0000 mL | Freq: Once | INTRAMUSCULAR | Status: AC
  Administered 2023-04-01: 5 mL via SUBCUTANEOUS

## 2023-04-01 MED ORDER — MIDODRINE HCL 5 MG PO TABS
15.0000 mg | ORAL_TABLET | Freq: Three times a day (TID) | ORAL | Status: DC
  Administered 2023-04-01 – 2023-04-06 (×15): 15 mg via ORAL
  Filled 2023-04-01 (×15): qty 3

## 2023-04-01 MED ORDER — LACTATED RINGERS IV BOLUS (SEPSIS)
1000.0000 mL | Freq: Once | INTRAVENOUS | Status: AC
  Administered 2023-04-01: 1000 mL via INTRAVENOUS

## 2023-04-01 MED ORDER — VANCOMYCIN HCL IN DEXTROSE 1-5 GM/200ML-% IV SOLN: 1000.0000 mg | Freq: Once | INTRAVENOUS | Status: DC

## 2023-04-01 MED ORDER — PRISMASOL BGK 0/2.5 32-2.5 MEQ/L EC SOLN
Status: DC
  Filled 2023-04-01 (×23): qty 5000

## 2023-04-01 MED ORDER — SODIUM CHLORIDE 0.9 % IV SOLN
2.0000 g | Freq: Once | INTRAVENOUS | Status: AC
  Administered 2023-04-01: 2 g via INTRAVENOUS
  Filled 2023-04-01: qty 12.5

## 2023-04-01 MED ORDER — SODIUM ZIRCONIUM CYCLOSILICATE 10 G PO PACK
10.0000 g | PACK | ORAL | Status: AC
  Administered 2023-04-01: 10 g via ORAL
  Filled 2023-04-01: qty 1

## 2023-04-01 MED ORDER — NOREPINEPHRINE 4 MG/250ML-% IV SOLN
INTRAVENOUS | Status: AC
  Filled 2023-04-01: qty 250

## 2023-04-01 MED ORDER — HEPARIN SODIUM (PORCINE) 1000 UNIT/ML DIALYSIS
1000.0000 [IU] | INTRAMUSCULAR | Status: DC | PRN
  Filled 2023-04-01: qty 6
  Filled 2023-04-01: qty 4
  Filled 2023-04-01 (×2): qty 6

## 2023-04-01 MED ORDER — LACTATED RINGERS IV BOLUS (SEPSIS): 1000.0000 mL | Freq: Once | INTRAVENOUS | Status: DC

## 2023-04-01 MED ORDER — PRISMASOL BGK 0/2.5 32-2.5 MEQ/L EC SOLN
Status: DC
  Filled 2023-04-01 (×3): qty 5000

## 2023-04-01 MED ORDER — METRONIDAZOLE 500 MG/100ML IV SOLN
500.0000 mg | Freq: Once | INTRAVENOUS | Status: AC
Start: 2023-04-01 — End: 2023-04-01
  Administered 2023-04-01: 500 mg via INTRAVENOUS
  Filled 2023-04-01 (×2): qty 100

## 2023-04-01 MED ORDER — LACTATED RINGERS IV BOLUS (SEPSIS): 600.0000 mL | Freq: Once | INTRAVENOUS | Status: DC

## 2023-04-01 MED ORDER — CHLORHEXIDINE GLUCONATE CLOTH 2 % EX PADS
6.0000 | MEDICATED_PAD | Freq: Every day | CUTANEOUS | Status: DC
Start: 2023-04-01 — End: 2023-04-06
  Administered 2023-04-01 – 2023-04-05 (×4): 6 via TOPICAL

## 2023-04-01 MED ORDER — CEFEPIME HCL 2 G IV SOLR
2.0000 g | Freq: Two times a day (BID) | INTRAVENOUS | Status: DC
  Administered 2023-04-02 – 2023-04-03 (×3): 2 g via INTRAVENOUS
  Filled 2023-04-01 (×3): qty 12.5

## 2023-04-01 MED ORDER — NOREPINEPHRINE 4 MG/250ML-% IV SOLN
2.0000 ug/min | INTRAVENOUS | Status: DC
  Administered 2023-04-02: 2 ug/min via INTRAVENOUS
  Administered 2023-04-02: 11 ug/min via INTRAVENOUS
  Filled 2023-04-01 (×2): qty 250

## 2023-04-01 MED ORDER — LIDOCAINE HCL (PF) 1 % IJ SOLN
INTRAMUSCULAR | Status: AC
  Filled 2023-04-01: qty 5

## 2023-04-01 MED ORDER — HEPARIN SODIUM (PORCINE) 5000 UNIT/ML IJ SOLN: 5000.0000 [IU] | Freq: Three times a day (TID) | INTRAMUSCULAR | Status: DC

## 2023-04-01 MED ORDER — SODIUM BICARBONATE 8.4 % IV SOLN
50.0000 meq | Freq: Once | INTRAVENOUS | Status: AC
  Administered 2023-04-01: 50 meq via INTRAVENOUS
  Filled 2023-04-01: qty 50

## 2023-04-01 MED ORDER — ORAL CARE MOUTH RINSE: 15.0000 mL | OROMUCOSAL | Status: DC | PRN

## 2023-04-01 MED ORDER — VANCOMYCIN HCL 10 G IV SOLR
2500.0000 mg | Freq: Once | INTRAVENOUS | Status: AC
  Administered 2023-04-01: 2500 mg via INTRAVENOUS
  Filled 2023-04-01: qty 2500

## 2023-04-01 MED ORDER — HEPARIN SODIUM (PORCINE) 5000 UNIT/ML IJ SOLN
500.0000 [IU]/h | INTRAMUSCULAR | Status: DC
  Filled 2023-04-01: qty 2

## 2023-04-01 MED ORDER — HEPARIN SODIUM (PORCINE) 1000 UNIT/ML IJ SOLN
1000.0000 [IU] | INTRAMUSCULAR | Status: DC | PRN
  Administered 2023-04-01: 4200 [IU]

## 2023-04-01 MED ORDER — VANCOMYCIN HCL 1500 MG/300ML IV SOLN
1500.0000 mg | INTRAVENOUS | Status: DC
  Filled 2023-04-01: qty 300

## 2023-04-01 MED ORDER — SODIUM CHLORIDE 0.9 % IV SOLN
250.0000 mL | INTRAVENOUS | Status: AC
  Administered 2023-04-01: 250 mL via INTRAVENOUS

## 2023-04-01 NOTE — Procedures (Signed)
Arterial Catheter Insertion Procedure Note  [name]  [id number]  January 04, 1976  Date:04/01/23  Time:11:05 PM    Provider Performing: Tim Lair    Procedure: Insertion of Arterial Line (52841) with US guidance (32440)   Indication(s): Blood pressure monitoring and/or need for frequent ABGs  Consent: Risks of the procedure as well as the alternatives and risks of each were explained to the patient and/or caregiver.  Consent for the procedure was obtained and is signed in the bedside chart  Anesthesia: Lidocaine 1% local anesthesia  Time Out: Verified patient identification, verified procedure, site/side was marked, verified correct patient position, special equipment/implants available, medications/allergies/relevant history reviewed, required imaging and test results available.  Sterile Technique: Maximal sterile technique including full sterile barrier drape, hand hygiene, sterile gown, sterile gloves, mask, hair covering, sterile ultrasound probe cover (if used).  Procedure Description: Area of catheter insertion was cleaned with chlorhexidine and draped in sterile fashion. With real-time ultrasound guidance an arterial catheter was placed into the left radial artery.  Appropriate arterial tracings confirmed on monitor.    Complications/Tolerance: None; patient tolerated the procedure well.  EBL: Minimal  Specimen(s) None  Tim Lair, PA-C [facility] 04/01/23 11:06 PM  Please see [email or web address] for pager details.  From 7A-7P if no response, please call [phone] After hours, please call ELink [phone]

## 2023-04-01 NOTE — Patient Outreach (Signed)
Care Coordination   04/01/2023 Name: [name] MRN: [id number] DOB: 1976-03-21   Care Coordination Outreach Attempts:  An unsuccessful telephone outreach was attempted for a scheduled appointment today.  Follow Up Plan:  Additional outreach attempts will be made to offer the patient care coordination information and services.   Encounter Outcome:  No Answer   Care Coordination Interventions:  No, not indicated    Bary Leriche, RN, MSN [facility]  [facility] Management Community Coordinator Direct Dial: [phone]  Fax: [phone] Website: [name]

## 2023-04-01 NOTE — ED Provider Notes (Signed)
[facility] [facility] EMERGENCY DEPARTMENT AT [facility] Provider Note   CSN: [id number] Arrival date & time: 04/01/23  1403     History  No chief complaint on file.   [name] is a 47 y.o. male.  The history is provided by the patient and medical records. No language interpreter was used.  Illness Location:  Fevers, chills, fatigue, malaise.  Drainage from catheter site Severity:  Severe Onset quality:  Gradual Duration:  5 days Timing:  Constant Progression:  Worsening Chronicity:  Recurrent Associated symptoms: cough, fatigue and fever   Associated symptoms: no abdominal pain, no chest pain, no congestion, no diarrhea, no nausea, no rash, no shortness of breath, no vomiting and no wheezing        Home Medications Prior to Admission medications   Medication Sig Start Date End Date Taking? Authorizing Provider  apixaban (ELIQUIS) 5 MG TABS tablet Take 5 mg by mouth 2 (two) times daily. 10/10/21   [provider]  atorvastatin (LIPITOR) 40 MG tablet Take 1 tablet (40 mg total) by mouth daily. 02/20/23   [name], MD  B Complex-C-Zn-Folic Acid (DIALYVITE/ZINC) TABS Take 1 tablet by mouth daily. 01/16/22   [provider]  Calcium Carb-Cholecalciferol (CALCIUM 600 + D PO) Take 2 tablets by mouth daily.    [provider]  ferric citrate (AURYXIA) 1 GM 210 MG(Fe) tablet Take 210 mg by mouth 3 (three) times daily with meals. 09/06/19   [provider]  midodrine (PROAMATINE) 5 MG tablet Take 15 mg by mouth 3 (three) times daily. Patient not taking: Reported on 03/04/2023 01/20/23   [provider]  Tenapanor HCl, CKD, (XPHOZAH) 30 MG TABS Take 30 mg by mouth in the morning and at bedtime.    [provider]      Allergies    Patient has no known allergies.    Review of Systems   Review of Systems  Constitutional:  Positive for chills, fatigue and fever.  HENT:  Negative for congestion.   Respiratory:   Positive for cough. Negative for chest tightness, shortness of breath and wheezing.   Cardiovascular:  Negative for chest pain and palpitations.  Gastrointestinal:  Negative for abdominal pain, constipation, diarrhea, nausea and vomiting.  Musculoskeletal:  Negative for back pain, neck pain and neck stiffness.  Skin:  Negative for rash and wound.  Psychiatric/Behavioral:  Negative for agitation and confusion.   All other systems reviewed and are negative.   Physical Exam Updated Vital Signs BP (!) 69/59   Pulse 94   Temp 100.2 F (37.9 C)   Resp 20   Ht 6\' 2"  (1.88 m)   Wt (!) 141.5 kg   SpO2 98%   BMI 40.05 kg/m  Physical Exam Vitals and nursing note reviewed.  Constitutional:      General: He is not in acute distress.    Appearance: He is well-developed. He is ill-appearing. He is not toxic-appearing or diaphoretic.  HENT:     Head: Normocephalic and atraumatic.     Nose: No congestion or rhinorrhea.     Mouth/Throat:     Pharynx: No oropharyngeal exudate or posterior oropharyngeal erythema.  Eyes:     Conjunctiva/sclera: Conjunctivae normal.     Pupils: Pupils are equal, round, and reactive to light.  Cardiovascular:     Rate and Rhythm: Normal rate and regular rhythm.     Heart sounds: No murmur heard. Pulmonary:     Effort: Pulmonary effort is normal.  No respiratory distress.     Breath sounds: Normal breath sounds. No wheezing, rhonchi or rales.  Chest:     Chest wall: No tenderness.  Abdominal:     Palpations: Abdomen is soft.     Tenderness: There is no abdominal tenderness. There is no guarding or rebound.    Musculoskeletal:        General: No swelling or tenderness.     Cervical back: Neck supple.     Right lower leg: Edema present.     Left lower leg: Edema present.  Skin:    General: Skin is warm and dry.     Capillary Refill: Capillary refill takes less than 2 seconds.     Findings: No erythema or rash.  Neurological:     General: No focal  deficit present.     Mental Status: He is alert.     Sensory: No sensory deficit.     Motor: No weakness.  Psychiatric:        Mood and Affect: Mood normal.     ED Results / Procedures / Treatments   Labs (all labs ordered are listed, but only abnormal results are displayed) Labs Reviewed  MRSA NEXT GEN BY PCR, NASAL - Abnormal; Notable for the following components:      Result Value   MRSA by PCR Next Gen DETECTED (*)    All other components within normal limits  COMPREHENSIVE METABOLIC PANEL - Abnormal; Notable for the following components:   Sodium 132 (*)    Potassium 6.7 (*)    Chloride 97 (*)    CO2 17 (*)    Glucose, Bld 103 (*)    BUN 94 (*)    Creatinine, Ser 17.66 (*)    Calcium 8.8 (*)    Albumin 3.3 (*)    GFR, Estimated 3 (*)    Anion gap 18 (*)    All other components within normal limits  CBC WITH DIFFERENTIAL/PLATELET - Abnormal; Notable for the following components:   RBC 3.78 (*)    Hemoglobin 10.4 (*)    HCT 32.8 (*)    RDW 17.2 (*)    Platelets 115 (*)    Lymphs Abs 0.4 (*)    All other components within normal limits  PROTIME-INR - Abnormal; Notable for the following components:   Prothrombin Time 22.2 (*)    INR 1.9 (*)    All other components within normal limits  APTT - Abnormal; Notable for the following components:   aPTT 38 (*)    All other components within normal limits  BASIC METABOLIC PANEL - Abnormal; Notable for the following components:   Sodium 133 (*)    Potassium 6.0 (*)    Chloride 96 (*)    CO2 19 (*)    Glucose, Bld 114 (*)    BUN 89 (*)    Creatinine, Ser 17.06 (*)    Calcium 8.8 (*)    GFR, Estimated 3 (*)    Anion gap 18 (*)    All other components within normal limits  I-STAT CG4 LACTIC ACID, ED - Abnormal; Notable for the following components:   Lactic Acid, Venous 2.3 (*)    All other components within normal limits  CULTURE, BLOOD (ROUTINE X 2)  RESP PANEL BY RT-PCR (RSV, FLU A&B, COVID)  RVPGX2  CULTURE,  BLOOD (ROUTINE X 2)  CULTURE, BLOOD (SINGLE)  CORTISOL  LACTIC ACID, PLASMA  RENAL FUNCTION PANEL  MAGNESIUM  APTT  CBC  I-STAT CG4  LACTIC ACID, ED    EKG EKG Interpretation Date/Time:  Wednesday April 01 2023 19:00:00 EDT Ventricular Rate:  88 PR Interval:  184 QRS Duration:  102 QT Interval:  360 QTC Calculation: 436 R Axis:   -59  Text Interpretation: Sinus rhythm Left anterior fascicular block Low voltage, precordial leads Abnormal R-wave progression, late transition when comapred to prior, shaper t waves. NO STEMI Confirmed by Theda Belfast ([id number]) on 04/01/2023 7:47:51 PM  Radiology CT EXTREMITY LOWER LEFT WO CONTRAST  Result Date: 04/01/2023 CLINICAL DATA:  Soft tissue infection suspected. EXAM: CT OF THE LOWER LEFT EXTREMITY WITHOUT CONTRAST TECHNIQUE: Multidetector CT imaging of the lower left extremity was performed according to the standard protocol. RADIATION DOSE REDUCTION: This exam was performed according to the departmental dose-optimization program which includes automated exposure control, adjustment of the mA and/or kV according to patient size and/or use of iterative reconstruction technique. COMPARISON:  None Available. FINDINGS: Bones/Joint/Cartilage No acute fracture or focal osseous lesion. There is a moderate-sized knee joint effusion. No significant hip joint effusion identified. There are moderate degenerative changes of the knee. Ligaments Suboptimally assessed by CT. Muscles and Tendons No intramuscular fluid collection, air or mass identified. Deep fascial planes appear preserved. Soft tissues Peripheral vascular calcifications are present. Left femoral catheter is present, partially imaged. No subcutaneous fluid collection or air identified. There is a calcified subcutaneous tract in the anterior thigh which may be related to prior procedure or prior catheter placement. Alternatively this may represent a calcified peripheral vessel. IMPRESSION: 1. No  acute osseous abnormality. 2. Moderate-sized knee joint effusion. 3. No subcutaneous fluid collection or air identified. 4. Peripheral vascular disease. Electronically Signed   By: Darliss Cheney M.D.   On: 04/01/2023 22:58   DG Chest Port 1 View  Result Date: 04/01/2023 CLINICAL DATA:  Sepsis. EXAM: PORTABLE CHEST 1 VIEW COMPARISON:  January 27, 2023. FINDINGS: The heart size and mediastinal contours are within normal limits. Both lungs are clear. The visualized skeletal structures are unremarkable. IMPRESSION: No active disease. Electronically Signed   By: Lupita Raider M.D.   On: 04/01/2023 20:43    Procedures Procedures    CRITICAL CARE Performed by: Canary Brim Ural Acree Total critical care time: 45 minutes Critical care time was exclusive of separately billable procedures and treating other patients. Critical care was necessary to treat or prevent imminent or life-threatening deterioration. Critical care was time spent personally by me on the following activities: development of treatment plan with patient and/or surrogate as well as nursing, discussions with consultants, evaluation of patient's response to treatment, examination of patient, obtaining history from patient or surrogate, ordering and performing treatments and interventions, ordering and review of laboratory studies, ordering and review of radiographic studies, pulse oximetry and re-evaluation of patient's condition.  Medications Ordered in ED Medications  heparin 10,000 units/ 20 mL infusion syringe (has no administration in time range)  prismasol BGK 2/2.5 replacement solution (has no administration in time range)  prismasol BGK 2/2.5 replacement solution (has no administration in time range)  prismasol BGK 2/2.5 dialysis solution (has no administration in time range)  midodrine (PROAMATINE) tablet 15 mg (15 mg Oral Given 04/01/23 2100)  ceFEPIme (MAXIPIME) 2 g in sodium chloride 0.9 % 100 mL IVPB (has no administration  in time range)  vancomycin (VANCOREADY) IVPB 1500 mg/300 mL (has no administration in time range)  0.9 %  sodium chloride infusion (250 mLs Intravenous New Bag/Given 04/01/23 2044)  norepinephrine (LEVOPHED) 4mg  in (0.016 mg/mL) premix  infusion (2 mcg/min Intravenous IV Pump [employer] 04/01/23 2136)  Chlorhexidine Gluconate Cloth 2 % PADS 6 each (6 each Topical Given 04/01/23 2112)  Oral care mouth rinse (has no administration in time range)  heparin sodium (porcine) injection 1,000-6,000 Units (4,200 Units Intracatheter Given 04/01/23 2330)  lactated ringers bolus 1,000 mL (0 mLs Intravenous Stopped 04/01/23 1955)  ceFEPIme (MAXIPIME) 2 g in sodium chloride 0.9 % 100 mL IVPB (0 g Intravenous Stopped 04/01/23 1826)  metroNIDAZOLE (FLAGYL) IVPB 500 mg (500 mg Intravenous New Bag/Given 04/01/23 2106)  vancomycin (VANCOCIN) 2,500 mg in sodium chloride 0.9 % 500 mL IVPB (2,500 mg Intravenous New Bag/Given 04/01/23 2046)  sodium zirconium cyclosilicate (LOKELMA) packet 10 g (10 g Oral Given 04/01/23 2050)  sodium bicarbonate injection 50 mEq (50 mEq Intravenous Given 04/01/23 2109)  lidocaine (PF) (XYLOCAINE) 1 % injection 5 mL (5 mLs Subcutaneous Given 04/01/23 2232)  lidocaine (PF) (XYLOCAINE) 1 % injection (  Duplicate 04/01/23 2330)    ED Course/ Medical Decision Making/ A&P                                 Medical Decision Making Amount and/or Complexity of Data Reviewed Labs: ordered. ECG/medicine tests: ordered.  Risk Prescription drug management. Decision regarding hospitalization.    [name] is a 47 y.o. male with a past medical history significant for previous kidney transplant with failure on dialysis TTS, obesity, paroxysmal atrial fibrillation on Eliquis therapy, recent foot osteomyelitis and dialysis catheter infection with subsequent sepsis last month requiring admission who presents with fevers, chills, fatigue, malaise, and some drainage from his catheter  site.  According to patient, he has been feeling bad since Saturday feeling more tired and febrile.  He has had mild cough today but denies chest pain or shortness of breath.  Denies abdominal pain flank pain or back pain.  Denies new leg pain or significant leg swelling.  He said that he has noted some drainage around his left groin catheter site with purulence and during dialysis 2 days ago he was feeling sicker so he stopped his treatment early.  He is post to have dialysis again tomorrow.  He reports no significant headache or neck pain and denies nausea or vomiting.  Otherwise resting.  On my initial evaluation, he is tachycardic, tachypneic, very warm to the touch with a oral temperature of 100.2 on arrival, and is hypotensive with blood pressure in the 60s systolic.  This is concerning for sepsis.  Patient made a code sepsis initially but it was confirmed by me and will order broad-spectrum antibiotics.  On exam, his lungs were clear and chest was nontender.  Abdomen nontender.  Wears not having tenderness around his catheter site about the medical student with me was able to express purulence from around the catheter site before he cleaned it.  Otherwise patient resting.  Patient will get code sepsis workup and will get fluids.  He is not hypoxic or having shortness of breath at this time.  He is on room air.  He needed pressors and admission to the ICU last visit.  If his blood pressure is not improved with initiation of fluids, we will start pressors and admit to the ICU.  Suspect he will need his catheter replaced again.  Will call nephrology to discuss management.  5:49 PM Just spoke to nephrology who agrees with our concern.  They request to go and go to ICU  because they are concerned he will likely need pressors.   Patient had IV placed and was able to get medication started.  Critical care will see and admit patient.  Nephrology will see patient.  Patient admitted to ICU for further  management.          Final Clinical Impression(s) / ED Diagnoses Final diagnoses:  Sepsis, due to unspecified organism, unspecified whether acute organ dysfunction present [facility])   Clinical Impression: 1. Sepsis, due to unspecified organism, unspecified whether acute organ dysfunction present [facility])     Disposition: Admit  This note was prepared with assistance of Dragon voice recognition software. Occasional wrong-word or sound-a-like substitutions may have occurred due to the inherent limitations of voice recognition software.     Jaxtin Raimondo, Canary Brim, MD 04/01/23 [phone]

## 2023-04-01 NOTE — H&P (Addendum)
NAME:  [name], MRN:  [id number], DOB:  29-Jan-1976, LOS: 0 ADMISSION DATE:  04/01/2023, CONSULTATION DATE:  04/01/23 REFERRING MD:  Dr. Rush Landmark, CHIEF COMPLAINT:  chills, N/V   History of Present Illness:   33 yoM with PMH as below significant for ESRD on TTS with prior failed past renal transplant, afib on eliquis, chronic hypotension, and anemia who presents from home with complaints of chills, malaise, and mild purulent drainage from left [facility] since Saturday.  Able to have full HD treatment on Saturday with reported SBP ~100 then. Off midodrine for 1 month. Was unable to have HD yesterday as they could not get catheter to work.  Developed cough, N/V/D x 1 day but able to keep down some fluids. Denies fever, abd pain, SOB, HA, dizziness.  Hospitalized 01/2023 with right foot MRSA osteomyelitis (completed 6 weeks abx per notes) and treated for proteus bacteremia.    In ER, temp 100.2, SBP noted 60-90's but remains oriented and mentating, normoxia, no respiratory distress.  Labs noted for Na 132, K 6.7, bicarb 17, sCr 17, BUN 97, lactic 2.3, WBC 5.2, Hgb 10.4, plts 115, INR 1.9, CXR and EKG pending.  Nephrology consulted.  ER noted some purulent drainage around L fem TDC but pt denies any tenderness, no warmth/ erythema.  Treated with ~1L NS. Cultures sent, started on empiric vanc, cefepime, flagyl.  SARS/ flu neg.  PCCM called for admit.   Pertinent  Medical History   Past Medical History:  Diagnosis Date   Acute respiratory failure with hypoxia (HCC) 11/30/2018   Anemia    ESRD   ESRD on hemodialysis (HCC) 06/07/2011   TTS [place]. Started HD in 2006, got transplant in 2014 lasted until Dec 2019 then went back on HD.  Has L thigh AVG as of Jun 2020.  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    History of blood transfusion    Hypertension    03/19/22- has not had high blood pressure in 5 years.   Morbid obesity (HCC)    Paroxysmal atrial fibrillation (HCC)     Pre-diabetes    no meds   Sepsis (HCC) 11/30/2018   Sleep apnea    lost weight, does not use CPAP   Significant Hospital Events: Including procedures, antibiotic start and stop dates in addition to other pertinent events     Interim History / Subjective:   Objective   Blood pressure (!) 69/59, pulse 94, temperature 100.2 F (37.9 C), resp. rate 20, height 6\' 2"  (1.88 m), weight (!) 141.5 kg, SpO2 98%.       No intake or output data in the 24 hours ending 04/01/23 1844 Filed Weights   04/01/23 1613  Weight: (!) 141.5 kg   Examination: General:  acute on chronically ill adult male lying in ER stretcher, NAD HEENT: MM pink/moist,  pupils 3/r, +JVD Neuro:  mildly lethargic, oriented x 3, non-focal CV: rr, no obvious murmur, faint radial and DP, left TDC site with sutures in place, no visible/ expressible purulence, no obvious erythema/ warmth- guaze dressing in place PULM:  non labored, +np cough, ?few scattered crackles LUL otherwise clear GI: obese, +bs, NT, prior RLQ scar, anuric at baseline  Extremities: warm/dry, no LE edema, right foot prior wound healed, no warmth, erythema/ drainage Skin: no rashes   Resolved Hospital Problem list    Assessment & Plan:   Severe sepsis/ borderline shock Lactic acidosis/ AGMA Chronic hypotension- off midodrine x 1 month per  pt - source suspected from [facility] site given reported purulence - mentating well, suspect BP higher than cuff reading, vasculopath, consider aline for true BP reading - restart midodrine - check cortisol, repeat lactic  - CXR ordered  - follow cultures, cont vanc/ cefepime - hold 2nd liter LR- not obviously dry, some JVD, lungs mostly clear, consider albumin if further fluid warranted - peripheral NE for MAP goal 55-60 and no mental status changes.  IV team consulted, may need CVL - will need TDC removed (as suspected source) however anticipate access may be difficult. - trend WBC/ fever curve  - NPO except for  meds.  Zofran prn if Qtc ok.     ESRD on TTS, failed prior renal transplant not on immunosuppressants Hyperkalemia  Chronic hyponatremia  - lokelma for now till CRRT initiated (HD not able to get L TDC to function) monitor for EKG changes - repeat BMET in 4hrs  - Nephrology following - plans for CRRT    PAF on eliquis - currently in NSR - hold eliquis/ systemic heparin for now pending procedures    Thrombocytopenia- suspected 2/2 to sepsis  Chronic anemia - H/H stable - trend CBC - LFTs wnl, INR 1.8 (on eliquis)   Best Practice (right click and "Reselect all SmartList Selections" daily)   Diet/type: NPO w/ oral meds DVT prophylaxis: prophylactic heparin  GI prophylaxis: N/A Lines: Dialysis Catheter- L femoral TDC Foley:  N/A Code Status:  full code Last date of multidisciplinary goals of care discussion [10/23]  Labs   CBC: Recent Labs  Lab 04/01/23 1633  WBC 5.2  NEUTROABS 4.1  HGB 10.4*  HCT 32.8*  MCV 86.8  PLT 115*    Basic Metabolic Panel: Recent Labs  Lab 04/01/23 1633  NA 132*  K 6.7*  CL 97*  CO2 17*  GLUCOSE 103*  BUN 94*  CREATININE 17.66*  CALCIUM 8.8*   GFR: Estimated Creatinine Clearance: 7.7 mL/min (A) (by C-G formula based on SCr of 17.66 mg/dL (H)). Recent Labs  Lab 04/01/23 1633 04/01/23 1649  WBC 5.2  --   LATICACIDVEN  --  2.3*    Liver Function Tests: Recent Labs  Lab 04/01/23 1633  AST 17  ALT 14  ALKPHOS 68  BILITOT 0.7  PROT 7.2  ALBUMIN 3.3*   No results for input(s): "LIPASE", "AMYLASE" in the last 168 hours. No results for input(s): "AMMONIA" in the last 168 hours.  ABG    Component Value Date/Time   PHART 7.415 12/06/2018 1420   PCO2ART 41.3 12/06/2018 1420   PO2ART 102.0 12/06/2018 1420   HCO3 26.5 12/06/2018 1420   TCO2 27 12/31/2022 1759   ACIDBASEDEF 8.0 (H) 12/01/2018 1656   O2SAT 98.0 12/06/2018 1420     Coagulation Profile: Recent Labs  Lab 04/01/23 1700  INR 1.9*    Cardiac  Enzymes: No results for input(s): "CKTOTAL", "CKMB", "CKMBINDEX", "TROPONINI" in the last 168 hours.  HbA1C: Hgb A1c MFr Bld  Date/Time Value Ref Range Status  02/18/2023 03:25 PM 5.2 4.6 - 6.5 % Final    Comment:    Glycemic Control Guidelines for People with Diabetes:Non Diabetic:  <6%Goal of Therapy: <7%Additional Action Suggested:  >8%   12/06/2022 10:53 AM 5.7 (H) 4.8 - 5.6 % Final    Comment:    (NOTE)         Prediabetes: 5.7 - 6.4         Diabetes: >6.4         Glycemic control  for adults with diabetes: <7.0     CBG: No results for input(s): "GLUCAP" in the last 168 hours.  Review of Systems:   Review of Systems  Constitutional:  Positive for chills and malaise/fatigue. Negative for fever.  Respiratory:  Positive for cough. Negative for hemoptysis, sputum production and shortness of breath.   Cardiovascular:  Negative for chest pain and leg swelling.  Gastrointestinal:  Positive for diarrhea, nausea and vomiting. Negative for abdominal pain and blood in stool.  Neurological:  Negative for dizziness, focal weakness, loss of consciousness and headaches.   Past Medical History:  He,  has a past medical history of Acute respiratory failure with hypoxia (HCC) (11/30/2018), Anemia, ESRD on hemodialysis (HCC) (06/07/2011), History of blood transfusion, Hypertension, Morbid obesity (HCC), Paroxysmal atrial fibrillation (HCC), Pre-diabetes, Sepsis (HCC) (11/30/2018), and Sleep apnea.   Surgical History:   Past Surgical History:  Procedure Laterality Date   ALLOGRAFT APPLICATION Right 02/01/2023   Procedure: ALLOGRAFT [facility];  Surgeon: Edwin Cap, DPM;  Location: [facility] OR;  Service: Orthopedics/Podiatry;  Laterality: Right;   AMPUTATION Right 12/12/2022   Procedure: AMPUTATION RAY;  Surgeon: Louann Sjogren, DPM;  Location: [facility] OR;  Service: Orthopedics/Podiatry;  Laterality: Right;  surgical team to do local block   APPLICATION OF WOUND VAC Right 02/01/2023    Procedure: APPLICATION OF WOUND VAC;  Surgeon: Edwin Cap, DPM;  Location: [facility] OR;  Service: Orthopedics/Podiatry;  Laterality: Right;   ARTERIOVENOUS GRAFT PLACEMENT  08/09/2010   Left Thigh Graft by Dr. Cari Caraway   AV FISTULA PLACEMENT Right 05/18/2018   Procedure: INSERTION OF ARTERIOVENOUS (AV) GORE-TEX GRAFT Left THIGH;  Surgeon: Maeola Harman, MD;  Location: [facility] OR;  Service: Vascular;  Laterality: Right;   AV FISTULA PLACEMENT Right 02/15/2021   Procedure: INSERTION OF ARTERIOVENOUS (AV) GORE-TEX LOOP GRAFT RIGHT THIGH;  Surgeon: Maeola Harman, MD;  Location: [facility] OR;  Service: Vascular;  Laterality: Right;   BIOPSY  05/20/2022   Procedure: BIOPSY;  Surgeon: Shellia Cleverly, DO;  Location: [facility] ENDOSCOPY;  Service: Gastroenterology;;   CAPD INSERTION N/A 06/13/2022   Procedure: LAPAROSCOPIC INSERTION CONTINUOUS AMBULATORY PERITONEAL DIALYSIS  (CAPD) CATHETER;  Surgeon: Maeola Harman, MD;  Location[facility] OR;  Service: Vascular;  Laterality: N/A;   CAPD REMOVAL  05/08/2011   Procedure: CONTINUOUS AMBULATORY PERITONEAL DIALYSIS  (CAPD) CATHETER REMOVAL;  Surgeon: Iona Coach, MD;  Location: [facility] OR;  Service: General;  Laterality: N/A;  Removal of CAPD catheter, Dr. requests to go after 100   CAPD REMOVAL N/A 08/08/2022   Procedure: REMOVAL CONTINUOUS AMBULATORY PERITONEAL DIALYSIS  (CAPD) CATHETER;  Surgeon: Maeola Harman, MD;  Location: [facility] OR;  Service: Vascular;  Laterality: N/A;   COLONOSCOPY WITH PROPOFOL N/A 05/20/2022   Procedure: COLONOSCOPY WITH PROPOFOL;  Surgeon: Shellia Cleverly, DO;  Location: [facility]OS[facility];  Service: Gastroenterology;  Laterality: N/A;   I & D EXTREMITY Right 12/08/2022   Procedure: IRRIGATION AND DEBRIDEMENT RIGHT FOOT;  Surgeon: Louann Sjogren, DPM;  Location: [facility] OR;  Service: Orthopedics/Podiatry;  Laterality: Right;   I & D EXTREMITY Right 02/01/2023   Procedure: IRRIGATION AND DEBRIDEMENT RIGHT FOOT;   Surgeon: Edwin Cap, DPM;  Location: [facility] OR;  Service: Orthopedics/Podiatry;  Laterality: Right;   INSERTION OF DIALYSIS CATHETER  10/05/2010   Right Femoral Cath insertion by Dr. Leonides Sake.  Pt ahas had several caths inserted.   INSERTION OF DIALYSIS CATHETER Right 02/15/2021   Procedure: Attempted INSERTION OF RIGHT and Left Internal  Jugular DIALYSIS CATHETER, Insertion of Left Femoral Vein Dialysis Catheter;  Surgeon: Maeola Harman, MD;  Location: [facility] OR;  Service: Vascular;  Laterality: Right;   INSERTION OF DIALYSIS CATHETER Right 04/26/2021   Procedure: INSERTION OF TUNNELED 55cm PALIDROME PRECISION CHRONIC DIALYSIS CATHETER;  Surgeon: Leonie Douglas, MD;  Location: [facility] OR;  Service: Vascular;  Laterality: Right;   INSERTION OF DIALYSIS CATHETER Left 12/26/2021   Procedure: INSERTION OF TUNNELED  DIALYSIS CATHETER LEFT FEMORAL ARTERY;  Surgeon: Maeola Harman, MD;  Location: [facility] OR;  Service: Vascular;  Laterality: Left;   INSERTION OF DIALYSIS CATHETER Left 03/06/2022   Procedure: INSERTION OF DIALYSIS CATHETER;  Surgeon: Maeola Harman, MD;  Location: [facility] OR;  Service: Vascular;  Laterality: Left;   INSERTION OF DIALYSIS CATHETER Left 12/31/2022   Procedure: INSERTION OF DIALYSIS CATHETER LEFT GROIN;  Surgeon: Leonie Douglas, MD;  Location: [facility] OR;  Service: Vascular;  Laterality: Left;   IR FLUORO GUIDE CV LINE LEFT  04/23/2022   IR FLUORO GUIDE CV LINE LEFT  01/29/2023   IR FLUORO GUIDE CV LINE LEFT  02/02/2023   IR FLUORO GUIDE CV LINE RIGHT  05/11/2018   IR FLUORO GUIDE CV LINE RIGHT  07/03/2021   IR FLUORO GUIDE CV LINE RIGHT  07/16/2021   IR PTA ADDL CENTRAL DIALYSIS SEG THRU DIALY CIRCUIT RIGHT Right 07/03/2021   IR PTA AND STENT ADDL CENTRAL DIALY SEG THRU DIALY CIRCUIT LEFT Left 02/02/2023   IR US GUIDE VASC ACCESS LEFT  04/23/2022   IR US GUIDE VASC ACCESS RIGHT  05/11/2018   IR VENO/EXT/UNI LEFT  04/23/2022   IR VENO/EXT/UNI LEFT  01/29/2023   IR  VENOCAVAGRAM IVC  07/16/2021   IR VENOCAVAGRAM IVC  02/02/2023   KIDNEY TRANSPLANT  2014   KNEE ARTHROSCOPY Left    LAPAROSCOPIC LYSIS OF ADHESIONS N/A 06/13/2022   Procedure: LAPAROSCOPIC LYSIS OF ADHESIONS;  Surgeon: Maeola Harman, MD;  Location: [facility] OR;  Service: Vascular;  Laterality: N/A;   POLYPECTOMY  05/20/2022   Procedure: POLYPECTOMY;  Surgeon: Shellia Cleverly, DO;  Location: [facility];  Service: Gastroenterology;;   THROMBECTOMY W/ EMBOLECTOMY Right 04/26/2021   Procedure: REMOVAL OF RIGHT THIGH ARTERIOVENOUS GORE-TEX GRAFT AND REPAIR OF RIGHT COMMON FEMORAL ARTERY;  Surgeon: Leonie Douglas, MD;  Location: [facility] OR;  Service: Vascular;  Laterality: Right;   UPPER EXTREMITY ANGIOGRAPHY Bilateral 05/13/2018   Procedure: UPPER EXTREMITY ANGIOGRAPHY - bilarteral;  Surgeon: Cephus Shelling, MD;  Location: [facility] INVASIVE CV LAB;  Service: Cardiovascular;  Laterality: Bilateral;     Social History:   reports that he quit smoking about 7 years ago. His smoking use included cigarettes. He has never used smokeless tobacco. He reports that he does not currently use alcohol. He reports that he does not currently use drugs after having used the following drugs: Marijuana.   Family History:  His family history includes Diabetes in his father; Stroke in his father and maternal grandmother. There is no history of Anesthesia problems.   Allergies No Known Allergies   Home Medications  Prior to Admission medications   Medication Sig Start Date End Date Taking? Authorizing Provider  apixaban (ELIQUIS) 5 MG TABS tablet Take 5 mg by mouth 2 (two) times daily. 10/10/21   [provider]  atorvastatin (LIPITOR) 40 MG tablet Take 1 tablet (40 mg total) by mouth daily. 02/20/23   [name], MD  B Complex-C-Zn-Folic Acid (DIALYVITE/ZINC) TABS Take 1 tablet by mouth  daily. 01/16/22   [provider]  Calcium Carb-Cholecalciferol (CALCIUM 600 + D PO) Take 2 tablets by  mouth daily.    [provider]  ferric citrate (AURYXIA) 1 GM 210 MG(Fe) tablet Take 210 mg by mouth 3 (three) times daily with meals. 09/06/19   [provider]  midodrine (PROAMATINE) 5 MG tablet Take 15 mg by mouth 3 (three) times daily. Patient not taking: Reported on 03/04/2023 01/20/23   [provider]  Tenapanor HCl, CKD, (XPHOZAH) 30 MG TABS Take 30 mg by mouth in the morning and at bedtime.    [provider]     Critical care time: 50 mins       Posey Boyer, MSN, AG-ACNP-BC [facility] 04/01/2023, 6:44 PM  See Amion for pager If no response to pager , please call [phone] until 7pm After 7:00 pm call Elink  [phone]

## 2023-04-01 NOTE — ED Triage Notes (Signed)
The pt has  a groin dialysis catheter and he thinks its infected  he was dialyzed yesterday  only partial dialysis yesterday  he has had chills  since yesterday

## 2023-04-01 NOTE — Progress Notes (Signed)
eLink Physician-Brief Progress Note Patient Name: [name] DOB: 1975/12/07 MRN: [id number]   Date of Service  04/01/2023  HPI/Events of Note  Patient admitted with septic shock presumed to be related to an infected HD catheter, he has received 1 liter of crystalloid so far.  eICU Interventions  Levophed gtt, stat administration of pending antibiotics, discuss with ground crew regarding possible Trialysis catheter for dialysis and medication administration access. Lokelma for hyperkalemia, CRRT tonight. New patient Evaluation.        Migdalia Dk 04/01/2023, 8:32 PM

## 2023-04-01 NOTE — Progress Notes (Addendum)
ED Pharmacy Antibiotic Sign Off An antibiotic consult was received from an ED provider for sepsis per pharmacy dosing for vancomycin and cefepime. A chart review was completed to assess appropriateness.   The following one time order(s) were placed:  Vancomycin 2500mg  x1  Cefepime 2g x1   Further antibiotic and/or antibiotic pharmacy consults should be ordered by the admitting provider if indicated.   Thank you for allowing pharmacy to be a part of this patient's care.   [name], PharmD, BCCCP  Clinical Pharmacist 04/01/23 5:25 PM  ADDENDUM:  Pharmacy Antibiotic Note  [name] is a 47 y.o. male admitted on 04/01/2023 with sepsis.  Pharmacy has been consulted for vancomycin and cefepime dosing.  Patient with hx of Proteus bacteremia 01/2023. Also MRSA in wound cx from 01/2023. Patient ESRD, being started on CRRT due to line access issues and soft blood pressures. Has 1x load of 2500mg  of vancomycin and cefepime 2g given in ED.   Plan: Start cefepime 2g q12h.  Patient received loading dose of vancomycin, will schedule 1500mg  q24h vancomycin.  F/u actual CRRT start time to adjust vancomycin start time as appropriate.  Follow culture data for de-escalation.  Monitor renal function for dose adjustments as indicated.   Height: 6\' 2"  (188 cm) Weight: (!) 141.5 kg (311 lb 15.2 oz) IBW/kg (Calculated) : 82.2  Temp (24hrs), Avg:100.2 F (37.9 C), Min:100.2 F (37.9 C), Max:100.2 F (37.9 C)  Recent Labs  Lab 04/01/23 1633 04/01/23 1649  WBC 5.2  --   CREATININE 17.66*  --   LATICACIDVEN  --  2.3*    Estimated Creatinine Clearance: 7.7 mL/min (A) (by C-G formula based on SCr of 17.66 mg/dL (H)).    No Known Allergies  Thank you for allowing pharmacy to be a part of this patient's care.  [name], PharmD, BCCCP  04/01/2023 7:26 PM

## 2023-04-01 NOTE — Sepsis Progress Note (Signed)
Notified bedside nurse of need to draw 2nd lactic acid.

## 2023-04-01 NOTE — Sepsis Progress Note (Signed)
eLink is following this Code Sepsis. °

## 2023-04-01 NOTE — Consult Note (Signed)
[facility] Nephrology Consult Note: Reason for Consult: To manage dialysis and dialysis related needs Referring Physician: Dr. Merrily Pew, [place]  HPI:  [name] is an 47 y.o. male with past medical history significant for hypertension, A-fib, ESRD on HD, anemia, osteomyelitis status post right first ray amputation, presented with concern about catheter related infection associated with fever and chills, seen as a consultation for the management of ESRD. The patient was admitted in 01/2023 for catheter related infection required antibiotic treatment and catheter exchange.  He went to dialysis yesterday and received only about an hour of treatment.  The dialysis was terminated early because of feeling chills and not feeling well per patient.  He has not missed.  Dialysis appointment as well.  The outpatient HD orders as below. In the ER, the temperature was 100.2, blood pressure in the 60s and requiring oxygen.  The labs showed potassium level 6.7, CO2 17, BUN 94, lactic acid level 2.3, WBC 5.2, hemoglobin 10.4.  Chest x-ray was done. Sepsis code was called in the ER.  Getting culture, starting cefepime, Flagyl and vancomycin.  Patient is being admitted to ICU for further management. Patient reports feeling weak, tired and chills.  Baseline shortness of breath but denied chest pain. OP HD orders:  Dialyzes at  [facility], TTS, EDW 137, 4.5 hrs, 2k2ca  Last HD on 10/22 for 1 hour. Left thigh TDC. Mircera 75 mcg 02/24/23.  Hectorol 2 mcg three times per week Parsbiv 7.5 mg tiw   Past Medical History:  Diagnosis Date   Acute respiratory failure with hypoxia (HCC) 11/30/2018   Anemia    ESRD   ESRD on hemodialysis (HCC) 06/07/2011   TTS [place]. Started HD in 2006, got transplant in 2014 lasted until Dec 2019 then went back on HD.  Has L thigh AVG as of Jun 2020.  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    History of blood transfusion    Hypertension     03/19/22- has not had high blood pressure in 5 years.   Morbid obesity (HCC)    Paroxysmal atrial fibrillation (HCC)    Pre-diabetes    no meds   Sepsis (HCC) 11/30/2018   Sleep apnea    lost weight, does not use CPAP    Past Surgical History:  Procedure Laterality Date   ALLOGRAFT APPLICATION Right 02/01/2023   Procedure: ALLOGRAFT [facility];  Surgeon: Edwin Cap, DPM;  Location: [facility] OR;  Service: Orthopedics/Podiatry;  Laterality: Right;   AMPUTATION Right 12/12/2022   Procedure: AMPUTATION RAY;  Surgeon: Louann Sjogren, DPM;  Location: [facility] OR;  Service: Orthopedics/Podiatry;  Laterality: Right;  surgical team to do local block   APPLICATION OF WOUND VAC Right 02/01/2023   Procedure: APPLICATION OF WOUND VAC;  Surgeon: Edwin Cap, DPM;  Location: [facility] OR;  Service: Orthopedics/Podiatry;  Laterality: Right;   ARTERIOVENOUS GRAFT PLACEMENT  08/09/2010   Left Thigh Graft by Dr. Cari Caraway   AV FISTULA PLACEMENT Right 05/18/2018   Procedure: INSERTION OF ARTERIOVENOUS (AV) GORE-TEX GRAFT Left THIGH;  Surgeon: Maeola Harman, MD;  Location: [facility] OR;  Service: Vascular;  Laterality: Right;   AV FISTULA PLACEMENT Right 02/15/2021   Procedure: INSERTION OF ARTERIOVENOUS (AV) GORE-TEX LOOP GRAFT RIGHT THIGH;  Surgeon: Maeola Harman, MD;  Location: [facility] OR;  Service: Vascular;  Laterality: Right;   BIOPSY  05/20/2022   Procedure: BIOPSY;  Surgeon: Shellia Cleverly, DO;  Location: [facility];  Service:  Gastroenterology;;   CAPD INSERTION N/A 06/13/2022   Procedure: LAPAROSCOPIC INSERTION CONTINUOUS AMBULATORY PERITONEAL DIALYSIS  (CAPD) CATHETER;  Surgeon: Maeola Harman, MD;  Location: [facility] OR;  Service: Vascular;  Laterality: N/A;   CAPD REMOVAL  05/08/2011   Procedure: CONTINUOUS AMBULATORY PERITONEAL DIALYSIS  (CAPD) CATHETER REMOVAL;  Surgeon: Iona Coach, MD;  Location: [facility] OR;  Service: General;  Laterality: N/A;  Removal of CAPD  catheter, Dr. requests to go after 100   CAPD REMOVAL N/A 08/08/2022   Procedure: REMOVAL CONTINUOUS AMBULATORY PERITONEAL DIALYSIS  (CAPD) CATHETER;  Surgeon: Maeola Harman, MD;  Location: [facility] OR;  Service: Vascular;  Laterality: N/A;   COLONOSCOPY WITH PROPOFOL N/A 05/20/2022   Procedure: COLONOSCOPY WITH PROPOFOL;  Surgeon: Shellia Cleverly, DO;  Location: [facility];  Service: Gastroenterology;  Laterality: N/A;   I & D EXTREMITY Right 12/08/2022   Procedure: IRRIGATION AND DEBRIDEMENT RIGHT FOOT;  Surgeon: Louann Sjogren, DPM;  Location: [facility] OR;  Service: Orthopedics/Podiatry;  Laterality: Right;   I & D EXTREMITY Right 02/01/2023   Procedure: IRRIGATION AND DEBRIDEMENT RIGHT FOOT;  Surgeon: Edwin Cap, DPM;  Location: [facility] OR;  Service: Orthopedics/Podiatry;  Laterality: Right;   INSERTION OF DIALYSIS CATHETER  10/05/2010   Right Femoral Cath insertion by Dr. Leonides Sake.  Pt ahas had several caths inserted.   INSERTION OF DIALYSIS CATHETER Right 02/15/2021   Procedure: Attempted INSERTION OF RIGHT and Left Internal Jugular DIALYSIS CATHETER, Insertion of Left Femoral Vein Dialysis Catheter;  Surgeon: Maeola Harman, MD;  Location: [facility] OR;  Service: Vascular;  Laterality: Right;   INSERTION OF DIALYSIS CATHETER Right 04/26/2021   Procedure: INSERTION OF TUNNELED 55cm PALIDROME PRECISION CHRONIC DIALYSIS CATHETER;  Surgeon: Leonie Douglas, MD;  Location: [facility] OR;  Service: Vascular;  Laterality: Right;   INSERTION OF DIALYSIS CATHETER Left 12/26/2021   Procedure: INSERTION OF TUNNELED  DIALYSIS CATHETER LEFT FEMORAL ARTERY;  Surgeon: Maeola Harman, MD;  Location: [facility] OR;  Service: Vascular;  Laterality: Left;   INSERTION OF DIALYSIS CATHETER Left 03/06/2022   Procedure: INSERTION OF DIALYSIS CATHETER;  Surgeon: Maeola Harman, MD;  Location: [facility] OR;  Service: Vascular;  Laterality: Left;   INSERTION OF DIALYSIS CATHETER Left 12/31/2022   Procedure:  INSERTION OF DIALYSIS CATHETER LEFT GROIN;  Surgeon: Leonie Douglas, MD;  Location[facility] OR;  Service: Vascular;  Laterality: Left;   IR FLUORO GUIDE CV LINE LEFT  04/23/2022   IR FLUORO GUIDE CV LINE LEFT  01/29/2023   IR FLUORO GUIDE CV LINE LEFT  02/02/2023   IR FLUORO GUIDE CV LINE RIGHT  05/11/2018   IR FLUORO GUIDE CV LINE RIGHT  07/03/2021   IR FLUORO GUIDE CV LINE RIGHT  07/16/2021   IR PTA ADDL CENTRAL DIALYSIS SEG THRU DIALY CIRCUIT RIGHT Right 07/03/2021   IR PTA AND STENT ADDL CENTRAL DIALY SEG THRU DIALY CIRCUIT LEFT Left 02/02/2023   IR US GUIDE VASC ACCESS LEFT  04/23/2022   IR US GUIDE VASC ACCESS RIGHT  05/11/2018   IR VENO/EXT/UNI LEFT  04/23/2022   IR VENO/EXT/UNI LEFT  01/29/2023   IR VENOCAVAGRAM IVC  07/16/2021   IR VENOCAVAGRAM IVC  02/02/2023   KIDNEY TRANSPLANT  2014   KNEE ARTHROSCOPY Left    LAPAROSCOPIC LYSIS OF ADHESIONS N/A 06/13/2022   Procedure: LAPAROSCOPIC LYSIS OF ADHESIONS;  Surgeon: Maeola Harman, MD;  Location: [facility] OR;  Service: Vascular;  Laterality: N/A;   POLYPECTOMY  05/20/2022   Procedure:  POLYPECTOMY;  Surgeon: Shellia Cleverly, DO;  Location: [facility];  Service: Gastroenterology;;   THROMBECTOMY W/ EMBOLECTOMY Right 04/26/2021   Procedure: REMOVAL OF RIGHT THIGH ARTERIOVENOUS GORE-TEX GRAFT AND REPAIR OF RIGHT COMMON FEMORAL ARTERY;  Surgeon: Leonie Douglas, MD;  Location: [facility] OR;  Service: Vascular;  Laterality: Right;   UPPER EXTREMITY ANGIOGRAPHY Bilateral 05/13/2018   Procedure: UPPER EXTREMITY ANGIOGRAPHY - bilarteral;  Surgeon: Cephus Shelling, MD;  [facility] INVAS[facility] CV LAB;  Service: Cardiovascular;  Laterality: Bilateral;    Family History  Problem Relation Age of Onset   Diabetes Father    Stroke Father    Stroke Maternal Grandmother    Anesthesia problems Neg Hx     Social History:  reports that he quit smoking about 7 years ago. His smoking use included cigarettes. He has never used smokeless tobacco. He reports  that he does not currently use alcohol. He reports that he does not currently use drugs after having used the following drugs: Marijuana.  Allergies: No Known Allergies  Medications: I have reviewed the patient's current medications.   Results for orders placed or performed during the hospital encounter of 04/01/23 (from the past 48 hour(s))  Comprehensive metabolic panel     Status: Abnormal   Collection Time: 04/01/23  4:33 PM  Result Value Ref Range   Sodium 132 (L) 135 - 145 mmol/L   Potassium 6.7 (HH) 3.5 - 5.1 mmol/L    Comment: CRITICAL RESULT CALLED TO, READ BACK BY AND VERIFIED WITH M.GROSE,RN @1724  04/01/2023 VANG.J   Chloride 97 (L) 98 - 111 mmol/L   CO2 17 (L) 22 - 32 mmol/L   Glucose, Bld 103 (H) 70 - 99 mg/dL    Comment: Glucose reference range applies only to samples taken after fasting for at least 8 hours.   BUN 94 (H) 6 - 20 mg/dL   Creatinine, Ser 10.27 (H) 0.61 - 1.24 mg/dL   Calcium 8.8 (L) 8.9 - 10.3 mg/dL   Total Protein 7.2 6.5 - 8.1 g/dL   Albumin 3.3 (L) 3.5 - 5.0 g/dL   AST 17 15 - 41 U/L   ALT 14 0 - 44 U/L   Alkaline Phosphatase 68 38 - 126 U/L   Total Bilirubin 0.7 0.3 - 1.2 mg/dL   GFR, Estimated 3 (L) >60 mL/min    Comment: (NOTE) Calculated using the CKD-EPI Creatinine Equation (2021)    Anion gap 18 (H) 5 - 15    Comment: Performed at [facility], [address] [place], [place] [address]  CBC with Differential     Status: Abnormal   Collection Time: 04/01/23  4:33 PM  Result Value Ref Range   WBC 5.2 4.0 - 10.5 K/uL   RBC 3.78 (L) 4.22 - 5.81 MIL/uL   Hemoglobin 10.4 (L) 13.0 - 17.0 g/dL   HCT 44.0 (L) 34.7 - 42.5 %   MCV 86.8 80.0 - 100.0 fL   MCH 27.5 26.0 - 34.0 pg   MCHC 31.7 30.0 - 36.0 g/dL   RDW 95.6 (H) 38.7 - 56.4 %   Platelets 115 (L) 150 - 400 K/uL   nRBC 0.0 0.0 - 0.2 %   Neutrophils Relative % 79 %   Neutro Abs 4.1 1.7 - 7.7 K/uL   Lymphocytes Relative 7 %   Lymphs Abs 0.4 (L) 0.7 - 4.0 K/uL   Monocytes Relative  12 %   Monocytes Absolute 0.6 0.1 - 1.0 K/uL   Eosinophils Relative 1 %  Eosinophils Absolute 0.0 0.0 - 0.5 K/uL   Basophils Relative 0 %   Basophils Absolute 0.0 0.0 - 0.1 K/uL   Immature Granulocytes 1 %   Abs Immature Granulocytes 0.05 0.00 - 0.07 K/uL    Comment: Performed at [facility], [address] [place], [place] [address]  I-Stat Lactic Acid, ED     Status: Abnormal   Collection Time: 04/01/23  4:49 PM  Result Value Ref Range   Lactic Acid, Venous 2.3 (HH) 0.5 - 1.9 mmol/L   Comment NOTIFIED PHYSICIAN     No results found.  ROS: As per H&P, rest of the systems reviewed and negative. Blood pressure (!) 69/59, pulse 94, temperature 100.2 F (37.9 C), resp. rate 20, height 6\' 2"  (1.88 m), weight (!) 141.5 kg, SpO2 98%. Gen: NAD, comfortable Respiratory: Clear bilateral, no wheezing or crackle Cardiovascular: Regular rate rhythm S1-S2 normal, no rubs GI: Abdomen soft, nontender, nondistended Extremities, no cyanosis or clubbing, no edema Skin: No rash or ulcer Neurology: Alert, awake, following commands, oriented Dialysis Access: Left groin catheter site has streak of blood around catheter, no frank pus or discharge noted.  Assessment/Plan:  # Septic shock presumably catheter related infection: Sepsis code called in ER and being treating with cefepime, Flagyl and vancomycin.  Starting pressures and being admitted to ICU.  Discussed with the ER and ICU team.  # Hyperkalemia: Incomplete dialysis due to sepsis and questionable catheter problem.  We will admit patient in ICU and start CRRT.  He will need temporary HD catheter.  # ESRD: CRRT tonight after temporary HD catheter placement.  Need to remove current femoral line.  Adjust dialysis prescription/potassium bath according to the lab.  # Anemia of ESRD: Hemoglobin at goal, continue ESA.  Hold iron.  # CKD-metabolic Bone Disease: Monitor, calcium and phosphorus level. Starting CRRT as discussed above.  Elliott Quade  [name] 04/01/2023, 6:32 PM

## 2023-04-02 ENCOUNTER — Inpatient Hospital Stay (HOSPITAL_COMMUNITY): Payer: Medicare HMO

## 2023-04-02 ENCOUNTER — Encounter (HOSPITAL_COMMUNITY): Payer: Self-pay | Admitting: Internal Medicine

## 2023-04-02 DIAGNOSIS — R6521 Severe sepsis with septic shock: Secondary | ICD-10-CM | POA: Diagnosis not present

## 2023-04-02 DIAGNOSIS — A419 Sepsis, unspecified organism: Secondary | ICD-10-CM | POA: Diagnosis not present

## 2023-04-02 HISTORY — PX: IR FLUORO GUIDE CV LINE LEFT: IMG2282

## 2023-04-02 LAB — BLOOD CULTURE ID PANEL (REFLEXED) - BCID2

## 2023-04-02 LAB — POCT I-STAT 7, (LYTES, BLD GAS, ICA,H+H)
Acid-base deficit: 13 mmol/L — ABNORMAL HIGH (ref 0.0–2.0)
Acid-base deficit: 5 mmol/L — ABNORMAL HIGH (ref 0.0–2.0)
Bicarbonate: 13.8 mmol/L — ABNORMAL LOW (ref 20.0–28.0)
Bicarbonate: 18.3 mmol/L — ABNORMAL LOW (ref 20.0–28.0)
Calcium, Ion: 1.17 mmol/L (ref 1.15–1.40)
Calcium, Ion: 1.03 mmol/L — ABNORMAL LOW (ref 1.15–1.40)
HCT: 37 % — ABNORMAL LOW (ref 39.0–52.0)
HCT: 31 % — ABNORMAL LOW (ref 39.0–52.0)
Hemoglobin: 12.6 g/dL — ABNORMAL LOW (ref 13.0–17.0)
Hemoglobin: 10.5 g/dL — ABNORMAL LOW (ref 13.0–17.0)
O2 Saturation: 94 %
O2 Saturation: 98 %
Patient temperature: 98.9
Patient temperature: 102.9
Potassium: 6 mmol/L — ABNORMAL HIGH (ref 3.5–5.1)
Potassium: 5.5 mmol/L — ABNORMAL HIGH (ref 3.5–5.1)
Sodium: 135 mmol/L (ref 135–145)
Sodium: 134 mmol/L — ABNORMAL LOW (ref 135–145)
TCO2: 15 mmol/L — ABNORMAL LOW (ref 22–32)
TCO2: 19 mmol/L — ABNORMAL LOW (ref 22–32)
pCO2 arterial: 32.7 mm[Hg] (ref 32–48)
pCO2 arterial: 30.1 mm[Hg] — ABNORMAL LOW (ref 32–48)
pH, Arterial: 7.235 — ABNORMAL LOW (ref 7.35–7.45)
pH, Arterial: 7.4 (ref 7.35–7.45)
pO2, Arterial: 84 mm[Hg] (ref 83–108)
pO2, Arterial: 109 mm[Hg] — ABNORMAL HIGH (ref 83–108)

## 2023-04-02 LAB — CBC
HCT: 35.9 % — ABNORMAL LOW (ref 39.0–52.0)
HCT: 31.8 % — ABNORMAL LOW (ref 39.0–52.0)
Hemoglobin: 11.2 g/dL — ABNORMAL LOW (ref 13.0–17.0)
Hemoglobin: 10.3 g/dL — ABNORMAL LOW (ref 13.0–17.0)
MCH: 27.5 pg (ref 26.0–34.0)
MCH: 27.9 pg (ref 26.0–34.0)
MCHC: 31.2 g/dL (ref 30.0–36.0)
MCHC: 32.4 g/dL (ref 30.0–36.0)
MCV: 88.2 fL (ref 80.0–100.0)
MCV: 86.2 fL (ref 80.0–100.0)
Platelets: 119 10*3/uL — ABNORMAL LOW (ref 150–400)
Platelets: 109 10*3/uL — ABNORMAL LOW (ref 150–400)
RBC: 4.07 MIL/uL — ABNORMAL LOW (ref 4.22–5.81)
RBC: 3.69 MIL/uL — ABNORMAL LOW (ref 4.22–5.81)
RDW: 17.2 % — ABNORMAL HIGH (ref 11.5–15.5)
RDW: 17.4 % — ABNORMAL HIGH (ref 11.5–15.5)
WBC: 2.4 10*3/uL — ABNORMAL LOW (ref 4.0–10.5)
WBC: 5 10*3/uL (ref 4.0–10.5)
nRBC: 0 % (ref 0.0–0.2)
nRBC: 0 % (ref 0.0–0.2)

## 2023-04-02 LAB — RENAL FUNCTION PANEL
Albumin: 3.5 g/dL (ref 3.5–5.0)
Albumin: 3 g/dL — ABNORMAL LOW (ref 3.5–5.0)
Albumin: 2.8 g/dL — ABNORMAL LOW (ref 3.5–5.0)
Anion gap: 22 — ABNORMAL HIGH (ref 5–15)
Anion gap: 17 — ABNORMAL HIGH (ref 5–15)
Anion gap: 11 (ref 5–15)
BUN: 93 mg/dL — ABNORMAL HIGH (ref 6–20)
BUN: 93 mg/dL — ABNORMAL HIGH (ref 6–20)
BUN: 78 mg/dL — ABNORMAL HIGH (ref 6–20)
CO2: 13 mmol/L — ABNORMAL LOW (ref 22–32)
CO2: 20 mmol/L — ABNORMAL LOW (ref 22–32)
CO2: 25 mmol/L (ref 22–32)
Calcium: 9.2 mg/dL (ref 8.9–10.3)
Calcium: 8.5 mg/dL — ABNORMAL LOW (ref 8.9–10.3)
Calcium: 8.3 mg/dL — ABNORMAL LOW (ref 8.9–10.3)
Chloride: 98 mmol/L (ref 98–111)
Chloride: 96 mmol/L — ABNORMAL LOW (ref 98–111)
Chloride: 97 mmol/L — ABNORMAL LOW (ref 98–111)
Creatinine, Ser: 17.29 mg/dL — ABNORMAL HIGH (ref 0.61–1.24)
Creatinine, Ser: 17.59 mg/dL — ABNORMAL HIGH (ref 0.61–1.24)
Creatinine, Ser: 14.45 mg/dL — ABNORMAL HIGH (ref 0.61–1.24)
GFR, Estimated: 3 mL/min — ABNORMAL LOW (ref >60)
GFR, Estimated: 3 mL/min — ABNORMAL LOW (ref >60)
GFR, Estimated: 4 mL/min — ABNORMAL LOW (ref >60)
Glucose, Bld: 131 mg/dL — ABNORMAL HIGH (ref 70–99)
Glucose, Bld: 111 mg/dL — ABNORMAL HIGH (ref 70–99)
Glucose, Bld: 108 mg/dL — ABNORMAL HIGH (ref 70–99)
Phosphorus: 5.8 mg/dL — ABNORMAL HIGH (ref 2.5–4.6)
Phosphorus: 4.1 mg/dL (ref 2.5–4.6)
Phosphorus: 4.7 mg/dL — ABNORMAL HIGH (ref 2.5–4.6)
Potassium: 5.8 mmol/L — ABNORMAL HIGH (ref 3.5–5.1)
Potassium: 5.2 mmol/L — ABNORMAL HIGH (ref 3.5–5.1)
Potassium: 5.7 mmol/L — ABNORMAL HIGH (ref 3.5–5.1)
Sodium: 133 mmol/L — ABNORMAL LOW (ref 135–145)
Sodium: 133 mmol/L — ABNORMAL LOW (ref 135–145)
Sodium: 133 mmol/L — ABNORMAL LOW (ref 135–145)

## 2023-04-02 LAB — DIC (DISSEMINATED INTRAVASCULAR COAGULATION)PANEL
D-Dimer, Quant: 5.89 ug{FEU}/mL — ABNORMAL HIGH (ref 0.00–0.50)
Fibrinogen: 690 mg/dL — ABNORMAL HIGH (ref 210–475)
INR: 1.8 — ABNORMAL HIGH (ref 0.8–1.2)
Platelets: 127 10*3/uL — ABNORMAL LOW (ref 150–400)
Prothrombin Time: 21.4 s — ABNORMAL HIGH (ref 11.4–15.2)
Smear Review: NONE SEEN
aPTT: 38 s — ABNORMAL HIGH (ref 24–36)

## 2023-04-02 LAB — GLUCOSE, CAPILLARY
Glucose-Capillary: 102 mg/dL — ABNORMAL HIGH (ref 70–99)
Glucose-Capillary: 109 mg/dL — ABNORMAL HIGH (ref 70–99)
Glucose-Capillary: 113 mg/dL — ABNORMAL HIGH (ref 70–99)
Glucose-Capillary: 121 mg/dL — ABNORMAL HIGH (ref 70–99)
Glucose-Capillary: 126 mg/dL — ABNORMAL HIGH (ref 70–99)
Glucose-Capillary: 80 mg/dL (ref 70–99)
Glucose-Capillary: 87 mg/dL (ref 70–99)
Glucose-Capillary: 94 mg/dL (ref 70–99)

## 2023-04-02 LAB — LACTIC ACID, PLASMA: Lactic Acid, Venous: 1.2 mmol/L (ref 0.5–1.9)

## 2023-04-02 LAB — CG4 I-STAT (LACTIC ACID): Lactic Acid, Venous: 8.5 mmol/L (ref 0.5–1.9)

## 2023-04-02 LAB — MAGNESIUM
Magnesium: 2.2 mg/dL (ref 1.7–2.4)
Magnesium: 1.9 mg/dL (ref 1.7–2.4)

## 2023-04-02 LAB — APTT: aPTT: 38 s — ABNORMAL HIGH (ref 24–36)

## 2023-04-02 MED ORDER — RENA-VITE PO TABS
1.0000 | ORAL_TABLET | Freq: Every day | ORAL | Status: DC
  Administered 2023-04-02 – 2023-04-05 (×4): 1 via ORAL
  Filled 2023-04-02 (×4): qty 1

## 2023-04-02 MED ORDER — MUPIROCIN 2 % EX OINT
1.0000 | TOPICAL_OINTMENT | Freq: Two times a day (BID) | CUTANEOUS | Status: DC
  Administered 2023-04-02 – 2023-04-03 (×3): 1 via NASAL
  Filled 2023-04-02 (×2): qty 22

## 2023-04-02 MED ORDER — HEPARIN SODIUM (PORCINE) 5000 UNIT/ML IJ SOLN
5000.0000 [IU] | Freq: Three times a day (TID) | INTRAMUSCULAR | Status: DC
  Administered 2023-04-02 – 2023-04-03 (×3): 5000 [IU] via SUBCUTANEOUS
  Filled 2023-04-02 (×3): qty 1

## 2023-04-02 MED ORDER — ACETAMINOPHEN 10 MG/ML IV SOLN
1000.0000 mg | Freq: Four times a day (QID) | INTRAVENOUS | Status: DC
  Administered 2023-04-02 (×2): 1000 mg via INTRAVENOUS
  Filled 2023-04-02 (×3): qty 100

## 2023-04-02 MED ORDER — LIDOCAINE HCL 1 % IJ SOLN
INTRAMUSCULAR | Status: AC
  Filled 2023-04-02: qty 20

## 2023-04-02 MED ORDER — NEPRO/CARBSTEADY PO LIQD
237.0000 mL | Freq: Two times a day (BID) | ORAL | Status: DC
  Administered 2023-04-02 – 2023-04-04 (×5): 237 mL via ORAL

## 2023-04-02 MED ORDER — HEPARIN SODIUM (PORCINE) 1000 UNIT/ML DIALYSIS
1000.0000 [IU] | INTRAMUSCULAR | Status: DC | PRN
  Administered 2023-04-03: 4200 [IU] via INTRAVENOUS_CENTRAL
  Filled 2023-04-02 (×2): qty 4
  Filled 2023-04-02: qty 6

## 2023-04-02 MED ORDER — ACETAMINOPHEN 500 MG PO TABS
1000.0000 mg | ORAL_TABLET | Freq: Four times a day (QID) | ORAL | Status: DC
  Administered 2023-04-02 – 2023-04-03 (×4): 1000 mg via ORAL
  Filled 2023-04-02 (×4): qty 2

## 2023-04-02 MED ORDER — SODIUM BICARBONATE 8.4 % IV SOLN
100.0000 meq | Freq: Once | INTRAVENOUS | Status: AC
  Administered 2023-04-02: 100 meq via INTRAVENOUS
  Filled 2023-04-02: qty 100

## 2023-04-02 MED ORDER — PRISMASOL BGK 0/2.5 32-2.5 MEQ/L REPLACEMENT SOLN
Status: DC
  Filled 2023-04-02 (×3): qty 5000

## 2023-04-02 MED ORDER — HEPARIN SODIUM (PORCINE) 1000 UNIT/ML IJ SOLN
INTRAMUSCULAR | Status: AC
  Filled 2023-04-02: qty 10

## 2023-04-02 MED ORDER — HEPARIN SODIUM (PORCINE) 1000 UNIT/ML DIALYSIS: 1000.0000 [IU] | INTRAMUSCULAR | Status: DC | PRN

## 2023-04-02 NOTE — Progress Notes (Signed)
Initial Nutrition Assessment  DOCUMENTATION CODES:   Obesity unspecified  INTERVENTION:  Resume regular diet, 1.2L fluid restriction - OK per MD and Nephrology Nepro Shake po BID, each supplement provides 425 kcal and 19 grams protein Renal MVI with minerals daily  NUTRITION DIAGNOSIS:   Increased nutrient needs related to other (see comment) (CRRT) as evidenced by estimated needs  GOAL:   Patient will meet greater than or equal to 90% of their needs  MONITOR:   PO intake, Supplement acceptance, Labs, I & O's, Weight trends  REASON FOR ASSESSMENT:   Consult  (CRRT protocol)  ASSESSMENT:   Pt admitted with c/o chills, diarrhea, nausea and emesis found to have septic shock. PMH significant for ESRD on RRT, failed renal transplant, afib on Eliquis, chronic hypotension. Recently admitted 01/2023 for R foot osteomyelitis and proteus bacteremia.  10/24: s/p TDC exchange  Pt sleeping soundly at time of visit. RN reports pt has not slept well recently. Pt awoke easily but provides limited history. He mentions that he has not eaten over the last 2 days. Prior to this he endorses eating well but does not quantify.   Pt reports his last know estimated dry weight to be ~135 kg. Uncertain what his weight has been recently.  Per Nephrology note, EDW 137 kg If current weight is accurate, pt is significantly under EDW which is concerning.   Admit weight: 141.5 kg -method of measurement unknown Current weight: 117.6 kg (bed weight)   Edema: mild pitting generalized, non-pitting BUE, mild pitting BLE  Nutritionally Relevant Medications: Scheduled Meds:  acetaminophen  1,000 mg Oral Q6H   Chlorhexidine Gluconate Cloth  6 each Topical Daily   feeding supplement (NEPRO CARB STEADY)  237 mL Oral BID BM   heparin injection (subcutaneous)  5,000 Units Subcutaneous Q8H   midodrine  15 mg Oral TID WC   multivitamin  1 tablet Oral QHS   mupirocin ointment  1 Application Nasal BID    Continuous Infusions:  sodium chloride 10 mL/hr at 04/02/23 1100   ceFEPime (MAXIPIME) IV Stopped (04/02/23 1028)   heparin 10,000 units/ 20 mL infusion syringe     norepinephrine (LEVOPHED) Adult infusion 4 mcg/min (04/02/23 1100)   prismasol BGK 2/2.5 dialysis solution 1,500 mL/hr at 04/02/23 1029   prismasol BGK 2/2.5 replacement solution 400 mL/hr at 04/02/23 1027   prismasol BGK 2/2.5 replacement solution 400 mL/hr at 04/02/23 1026   vancomycin     PRN Meds:.heparin sodium (porcine), mouth rinse  Labs Reviewed: Sodium 133, potassium 5.2, BUN 93, Cr 17.56, anion gap 17, ionized calcium 1.03, GFR 3 CBG ranges from 87-126 mg/dL over the last 24 hours HgbA1c 5.2% (02/18/23)   NUTRITION - FOCUSED PHYSICAL EXAM:  Flowsheet Row Most Recent Value  Orbital Region No depletion  Upper Arm Region Moderate depletion  Thoracic and Lumbar Region No depletion  Buccal Region No depletion  Temple Region No depletion  Clavicle Bone Region No depletion  Clavicle and Acromion Bone Region No depletion  Scapular Bone Region No depletion  Dorsal Hand No depletion  Patellar Region No depletion  Anterior Thigh Region No depletion  Posterior Calf Region No depletion  Edema (RD Assessment) Moderate  [BLE]  Hair Reviewed  Eyes Reviewed  Mouth Unable to assess  Skin Reviewed  Nails Reviewed       Diet Order:   Diet Order             Diet regular Room service appropriate? Yes with Assist; Fluid consistency: Thin;  Fluid restriction: 1200 mL Fluid  Diet effective now                   EDUCATION NEEDS:   Not appropriate for education at this time  Skin:  Skin Assessment: Skin Integrity Issues: Skin Integrity Issues:: Other (Comment) Other: open R anterior toe amputation  Last BM:  10/23  Height:   Ht Readings from Last 1 Encounters:  04/01/23 6\' 2"  (1.88 m)    Weight:   Wt Readings from Last 1 Encounters:  04/02/23 117.6 kg    Ideal Body Weight:  86.4 kg  BMI:   Body mass index is 33.29 kg/m.  Estimated Nutritional Needs:   Kcal:  2300-2500  Protein:  160-175g  Fluid:  1L + UOP  Drusilla Kanner, RDN, LDN Clinical Nutrition

## 2023-04-02 NOTE — Progress Notes (Signed)
Ok with sbp 70s via aline as long as pt mentating well per [place], [place].

## 2023-04-02 NOTE — Progress Notes (Signed)
Pharmacy Antibiotic Note  [name] is a 47 y.o. male admitted on 04/01/2023 with sepsis.  Pharmacy has been consulted for cefepime dosing.  CRRT started at 10:50 on 10/24. Ordered effluent rate is < 30 mL/kg/hr.    Plan: Cefepime 2000 mg q12h - continue   Height: 6\' 2"  (188 cm) Weight: 117.6 kg (259 lb 4.2 oz) IBW/kg (Calculated) : 82.2  Temp (24hrs), Avg:100.6 F (38.1 C), Min:99 F (37.2 C), Max:102.6 F (39.2 C)  Recent Labs  Lab 04/01/23 1633 04/01/23 1649 04/01/23 2125 04/01/23 2302 04/02/23 0117 04/02/23 0119 04/02/23 0418  WBC 5.2  --   --   --   --  2.4* 5.0  CREATININE 17.66*  --   --  17.06*  --  17.29* 17.59*  LATICACIDVEN  --  2.3* 1.0  --  8.5*  --  1.2    Estimated Creatinine Clearance: 7 mL/min (A) (by C-G formula based on SCr of 17.59 mg/dL (H)).    No Known Allergies  Antimicrobials this admission: Vancomycin 10/23 x1 Metronidazole 10/23 x1  Cefepime 10/23 >>    Dose adjustments this admission: N/a  Microbiology results: 10/23 BCx: Enterobacterales  10/23 MRSA PCR: positive   Thank you for allowing pharmacy to be a part of this patient's care.  Cedric Fishman 04/02/2023 7:52 AM

## 2023-04-02 NOTE — Progress Notes (Signed)
Verbal order given by Cloyd Stagers, PA to uptitrate Levo at this time. Pt SBP 60s on art line.

## 2023-04-02 NOTE — Progress Notes (Signed)
Interventional Radiology Progress Note  VIR received request for management of presumed infected left tunneled HD catheter.   Pending office visit outpatient to discuss IVC reconstruction.   Previously considering trans-hepatic approach as the only remaining access possibility -- this was before treating left iliac occlusion.   Consider the current access point as the only feasible remaining at this time.   We will plan for exchange of the current for new catheter.  This current access cannot be removed for line holiday given the lack of other points of access.   Local anesthesia only.  No sedation needed.   Plan for today.   Call with questions/concerns.   Signed,  Yvone Neu. Loreta Ave, DO, ABVM, RPVI

## 2023-04-02 NOTE — Progress Notes (Addendum)
[facility] Progress Note    Assessment/ Plan:   Septic shock presumably catheter related infection: abx and pressor support per primary service   Hyperkalemia: Incomplete dialysis due to sepsis and questionable catheter problem.  K improving, plan for [facility] exchange then CRRT afterwards   ESRD -Outpatient orders: [facility].  TTS.  4.5 hours.  EDW 137 kg.  TDC.  Flow rates: 500/500.  2K/2 calcium.  Heparin: 5000 units bolus, 2500 units intermittent.  Meds: Hectorol 2 mcg with treatment, Parsabiv 7.5 mg with treatment. Not on ESA or Fe currently -plan for [facility] exchange and CRRT afterwards. IR following. Unfortunately, does not have any other access points and cannot have a line holiday   Anemia of ESRD: Hemoglobin 10.3. Avoiding iron. Transfuse PRN for Hgb <7. Start ESA if/when hgb <10   CKD-metabolic Bone Disease: PO4 acceptable, resume home binders if on any  Thrombocytopenia: per primary service, likely related to sepsis?  Subjective:   No acute events overnight. Not on crrt yet. No complaints. K down to 5.2 Pressors: levophed   Objective:   BP 128/84   Pulse 81   Temp 99.7 F (37.6 C) (Oral)   Resp (!) 24   Ht 6\' 2"  (1.88 m)   Wt 117.6 kg   SpO2 92%   BMI 33.29 kg/m   Intake/Output Summary (Last 24 hours) at 04/02/2023 6213 Last data filed at 04/02/2023 0800 Gross per 24 hour  Intake 1224.67 ml  Output --  Net 1224.67 ml   Weight change:   Physical Exam: Gen: NAD, laying flat CVS: RRR Resp: CTA B/L Abd: obese, soft Ext: no sig edema b/l Les Neuro: sleepy but arousable Dialysis access: left fem [facility]  Imaging: DG CHEST PORT 1 VIEW  Result Date: 04/02/2023 CLINICAL DATA:  Respiratory distress EXAM: PORTABLE CHEST 1 VIEW COMPARISON:  04/01/2023 FINDINGS: Cardiac shadow is within normal limits. Lungs are well aerated bilaterally. No focal infiltrate or effusion is seen. Patient is somewhat rotated to the left accentuating the mediastinal  markings particularly in the azygous vein. The azygous may be somewhat prominent due to collateral flow seen on prior exams. No bony abnormality is noted. IMPRESSION: No acute abnormality noted. Prominent azygous vein related to collateral venous pathways. Electronically Signed   By: Alcide Clever M.D.   On: 04/02/2023 01:43   CT EXTREMITY LOWER LEFT WO CONTRAST  Result Date: 04/01/2023 CLINICAL DATA:  Soft tissue infection suspected. EXAM: CT OF THE LOWER LEFT EXTREMITY WITHOUT CONTRAST TECHNIQUE: Multidetector CT imaging of the lower left extremity was performed according to the standard protocol. RADIATION DOSE REDUCTION: This exam was performed according to the departmental dose-optimization program which includes automated exposure control, adjustment of the mA and/or kV according to patient size and/or use of iterative reconstruction technique. COMPARISON:  None Available. FINDINGS: Bones/Joint/Cartilage No acute fracture or focal osseous lesion. There is a moderate-sized knee joint effusion. No significant hip joint effusion identified. There are moderate degenerative changes of the knee. Ligaments Suboptimally assessed by CT. Muscles and Tendons No intramuscular fluid collection, air or mass identified. Deep fascial planes appear preserved. Soft tissues Peripheral vascular calcifications are present. Left femoral catheter is present, partially imaged. No subcutaneous fluid collection or air identified. There is a calcified subcutaneous tract in the anterior thigh which may be related to prior procedure or prior catheter placement. Alternatively this may represent a calcified peripheral vessel. IMPRESSION: 1. No acute osseous abnormality. 2. Moderate-sized knee joint effusion. 3. No subcutaneous fluid collection or air  identified. 4. Peripheral vascular disease. Electronically Signed   By: Darliss Cheney M.D.   On: 04/01/2023 22:58   DG Chest Port 1 View  Result Date: 04/01/2023 CLINICAL DATA:   Sepsis. EXAM: PORTABLE CHEST 1 VIEW COMPARISON:  January 27, 2023. FINDINGS: The heart size and mediastinal contours are within normal limits. Both lungs are clear. The visualized skeletal structures are unremarkable. IMPRESSION: No active disease. Electronically Signed   By: Lupita Raider M.D.   On: 04/01/2023 20:43    Labs: BMET Recent Labs  Lab 04/01/23 1633 04/01/23 2302 04/02/23 0111 04/02/23 0119 04/02/23 0203 04/02/23 0418  NA 132* 133* 135 133* 134* 133*  K 6.7* 6.0* 6.0* 5.8* 5.5* 5.2*  CL 97* 96*  --  98  --  96*  CO2 17* 19*  --  13*  --  20*  GLUCOSE 103* 114*  --  131*  --  111*  BUN 94* 89*  --  93*  --  93*  CREATININE 17.66* 17.06*  --  17.29*  --  17.59*  CALCIUM 8.8* 8.8*  --  9.2  --  8.5*  PHOS  --   --   --  5.8*  --  4.1   CBC Recent Labs  Lab 04/01/23 1633 04/02/23 0111 04/02/23 0119 04/02/23 0203 04/02/23 0418  WBC 5.2  --  2.4*  --  5.0  NEUTROABS 4.1  --   --   --   --   HGB 10.4* 12.6* 11.2* 10.5* 10.3*  HCT 32.8* 37.0* 35.9* 31.0* 31.8*  MCV 86.8  --  88.2  --  86.2  PLT 115*  --  127*  119*  --  109*    Medications:     Chlorhexidine Gluconate Cloth  6 each Topical Daily   midodrine  15 mg Oral TID WC   mupirocin ointment  1 Application Nasal BID      Anthony Sar, MD [facility] 04/02/2023, 8:38 AM

## 2023-04-02 NOTE — Progress Notes (Signed)
26 - pt called out stating he was very cold. Pt visibly shivering. New onset tachycardia in the 160s, tachypnea in the 40s, and hypertension 220s via aline. Pt complaining of new onset cramps in bilateral upper thighs and shivering got progressively worse. Pt chest clear on auscultation, unchanged from prior assessment. Pecola Leisure, PA at beside.   Repeat labs drawn, ECG 12 lead and PCXR completed. Rectal temp 102.6. IV tylenol and 2 amps bicarb administered.

## 2023-04-02 NOTE — Progress Notes (Signed)
eLink Physician-Brief Progress Note Patient Name: [name] DOB: 08-17-1975 MRN: [id number]   Date of Service  04/02/2023  HPI/Events of Note  Patient with shivering, cramping, tachypnea, tachycardia and elevated blood pressure, concern for bacteremia, he has received all three antibiotics, chest clear to auscultation per bedside RN.  eICU Interventions  Stat portable CXR, ABG and labs including DIC profile ordered and ground crew requested to evaluate patient at bedside.        Hadrian Yarbrough U Wasyl Dornfeld 04/02/2023, 1:19 AM

## 2023-04-02 NOTE — Progress Notes (Signed)
Pt arrived to 2M05. VSS, A&Ox4. Pt belongings include clothing, shoes, wallet, and phone.

## 2023-04-02 NOTE — Progress Notes (Signed)
PHARMACY - PHYSICIAN COMMUNICATION CRITICAL VALUE ALERT - BLOOD CULTURE IDENTIFICATION (BCID)  [name] is an 46 y.o. male who presented to [facility] on 04/01/2023 with a chief complaint of possible sepsis with shock.   Assessment:   47 yo male patient presents with possible sepsis. History of ESRD on RRT, currently on CRRT. BCID positive for 4/10 with enterobacterales. Patient is currently being treated with vancomycin and cefepime. Will reach out discontinue vancomycin and continue on cefepime monotherapy   Name of physician (or Provider) Contacted: Dr. Katrinka Blazing   Current antibiotics: Vancomycin and cefepime   Changes to prescribed antibiotics recommended:  Recommendations accepted by provider - discontinue vancomycin   Results for orders placed or performed during the hospital encounter of 01/27/23  Blood Culture ID Panel (Reflexed) (Collected: 01/27/2023 11:00 AM)  Result Value Ref Range   Enterococcus faecalis NOT DETECTED NOT DETECTED   Enterococcus Faecium NOT DETECTED NOT DETECTED   Listeria monocytogenes NOT DETECTED NOT DETECTED   Staphylococcus species NOT DETECTED NOT DETECTED   Staphylococcus aureus (BCID) NOT DETECTED NOT DETECTED   Staphylococcus epidermidis NOT DETECTED NOT DETECTED   Staphylococcus lugdunensis NOT DETECTED NOT DETECTED   Streptococcus species NOT DETECTED NOT DETECTED   Streptococcus agalactiae NOT DETECTED NOT DETECTED   Streptococcus pneumoniae NOT DETECTED NOT DETECTED   Streptococcus pyogenes NOT DETECTED NOT DETECTED   A.calcoaceticus-baumannii NOT DETECTED NOT DETECTED   Bacteroides fragilis NOT DETECTED NOT DETECTED   Enterobacterales DETECTED (A) NOT DETECTED   Enterobacter cloacae complex NOT DETECTED NOT DETECTED   Escherichia coli NOT DETECTED NOT DETECTED   Klebsiella aerogenes NOT DETECTED NOT DETECTED   Klebsiella oxytoca NOT DETECTED NOT DETECTED   Klebsiella pneumoniae NOT DETECTED NOT DETECTED   Proteus species DETECTED  (A) NOT DETECTED   Salmonella species NOT DETECTED NOT DETECTED   Serratia marcescens NOT DETECTED NOT DETECTED   Haemophilus influenzae NOT DETECTED NOT DETECTED   Neisseria meningitidis NOT DETECTED NOT DETECTED   Pseudomonas aeruginosa NOT DETECTED NOT DETECTED   Stenotrophomonas maltophilia NOT DETECTED NOT DETECTED   Candida albicans NOT DETECTED NOT DETECTED   Candida auris NOT DETECTED NOT DETECTED   Candida glabrata NOT DETECTED NOT DETECTED   Candida krusei NOT DETECTED NOT DETECTED   Candida parapsilosis NOT DETECTED NOT DETECTED   Candida tropicalis NOT DETECTED NOT DETECTED   Cryptococcus neoformans/gattii NOT DETECTED NOT DETECTED   CTX-M ESBL NOT DETECTED NOT DETECTED   Carbapenem resistance IMP NOT DETECTED NOT DETECTED   Carbapenem resistance KPC NOT DETECTED NOT DETECTED   Carbapenem resistance NDM NOT DETECTED NOT DETECTED   Carbapenem resist OXA 48 LIKE NOT DETECTED NOT DETECTED   Carbapenem resistance VIM NOT DETECTED NOT DETECTED    [name] 04/02/2023  12:20 PM

## 2023-04-02 NOTE — Progress Notes (Signed)
eLink Physician-Brief Progress Note Patient Name: [name] DOB: 07-11-1975 MRN: [id number]   Date of Service  04/02/2023  HPI/Events of Note  Patient with a 5 - 7 beat run of wide-complex tachycardia with spontaneous termination, recent K+ 5.7, Mg++ 1.9.  eICU Interventions  Will order a gram of magnesium sulfate to bring Mg++ above 2.0.        [name] 04/02/2023, 11:15 PM

## 2023-04-02 NOTE — Progress Notes (Addendum)
NAME:  [name], MRN:  [id number], DOB:  [birth year], LOS: 1 ADMISSION DATE:  04/01/2023, CONSULTATION DATE:  04/02/2023 REFERRING MD:  Dr. Rush Landmark, CHIEF COMPLAINT:  Chills, N/V   History of Present Illness:  47 y.o. male with a known history of end-stage renal disease on renal replacement therapy.  He does have a prior history of failed renal transplant.  Chronically patient is on systemic anticoagulation with Eliquis for atrial fibrillation.  He has a known history of chronic hypotension for which she takes midodrine.  Patient presents with a cough as well as nausea, vomiting, and diarrhea for 1 day.  He has been able to keep down some fluids.  He denies any frank abdominal pain.  He has had no subjective fever.  He denies any headache or near syncope.  In the emergency department the patient had questionable drainage from left femoral dialysis access site which was placed by interventional radiology in August 2024.  Notably patient was hospitalized in August 2024 and treated for right foot osteomyelitis.  Patient was also treated for a Proteus bacteremia.  In the emergency department the patient had a low-grade fever.  His blood pressure was fluctuating intermittently.  He was noted to have hyperkalemia with metabolic acidosis that prompted nephrology consultation.  Patient was initiated on vancomycin, cefepime, and Flagyl in the ED and critical care was consulted.  At the time of my evaluation patient denied any difficulty breathing or wheezing.  He denied any acute vision changes.   Pertinent  Medical History   Past Medical History:  Diagnosis Date   Acute respiratory failure with hypoxia (HCC) 11/30/2018   Anemia    ESRD   ESRD on hemodialysis (HCC) 06/07/2011   TTS [place]. Started HD in 2006, got transplant in 2014 lasted until Dec 2019 then went back on HD.  Has L thigh AVG as of Jun 2020.  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    History of blood transfusion     Hypertension    03/19/22- has not had high blood pressure in 5 years.   Morbid obesity (HCC)    Paroxysmal atrial fibrillation (HCC)    Pre-diabetes    no meds   Sepsis (HCC) 11/30/2018   Sleep apnea    lost weight, does not use CPAP     Significant Hospital Events: Including procedures, antibiotic start and stop dates in addition to other pertinent events   Cefepime, Flagyl, and Vanc. started 10/23 Flagyl stopped 10/23  Interim History / Subjective:  -Overnight patient with shivering, cramping, tachypnea, tachycardia and elevated blood pressure. Stat portable CXR, ABG and labs including DIC profile ordered and ground crew requested to evaluate patient at bedside.  -Currently patient without complaints.  Objective   Blood pressure 128/84, pulse 99, temperature (!) 102.2 F (39 C), temperature source Rectal, resp. rate (!) 25, height 6\' 2"  (1.88 m), weight 117.6 kg, SpO2 93%.        Intake/Output Summary (Last 24 hours) at 04/02/2023 7829 Last data filed at 04/02/2023 0600 Gross per 24 hour  Intake 1041.03 ml  Output --  Net 1041.03 ml   Filed Weights   04/01/23 1613 04/02/23 0500  Weight: (!) 141.5 kg 117.6 kg    Physical Exam: Constitutional: obese, lying in bed, in no acute distress, on 2 L [place] HENT: normocephalic atraumatic, mucous membranes moist Eyes: conjunctiva non-erythematous Cardiovascular: regular rate and rhythm, no m/r/g Pulmonary/Chest: normal work of breathing on room air, lungs clear  to auscultation bilaterally Abdominal: soft, non-tender, non-distended MSK: normal bulk and tone Neurological: alert & oriented x 3, no focal deficit Skin: warm and dry, left formal TDC without: pain, surrounding erythema, drainage, no rashes Psych: normal mood and behavior   Resolved Hospital Problem list   N/A  Assessment & Plan:   #Sepsis  -BCID positive for 4/10 with enterobacterales  -Potential source is [facility] which IR removed today -Had transient fever  overnight with T-Max = 102.6. Currently afebrile. No leukocytosis, intermittent tachycardia/tachypnea, normotensive, 96% on 2 L [place],  -Vancomycin d/c today per pharmacy and continuing Cefepime (Day 2). Did receive one dose of IV Flagyl 500 mg on 10/23.  -CT of the left lower extremity shows no acute osseous abnormality, moderate-sized knee joint effusion, no subcutaneous fluid collection or air identified, peripheral vascular disease. -CXR unremarkable    #Possible shock -Patient with known chronic hypotension treated with midodrine.  Has been off midodrine for 1 month.  Restarted home midodrine.   -Placed left radial arterial line to more closely monitor blood pressure. Serum Cortisol normal, but this was collected at 6:33 pm so limited utility. Not on steroids. I-Stat Lactic acid early this morning was 8.5 with repeat being 1.2. -DIC panel with PT/INR of 21.4/1.8, aPTT = 38, Fibrinogen  = 690, D-Dimer = 5.89, Platelets = 127, no schistocytes. -Continue Levophed gtt and Midodrine po 15 mg TID -Currently normotensive   #Hyperkalemia -HD on TTS with incomplete session Tuesday due to aforementioned symptoms.   -Potassium down-trended to 5.2 (6.7 on admission). S/p Lokelma that was given on ICU arrival. Administered bolus sodium bicarb x1 amp.  -Per Nephrology, with inititate CRRT after [facility] exchange -Continuing telemetry monitoring.   #ESRD Nephrology consulted and guiding renal replacement therapy.  Consulting interventional radiology for dialysis access to the right lower extremity.   #Paroxysmal atrial fibrillation Monitoring patient on telemetry.  Holding home Eliquis given potential need for invasive interventions.   #Thrombocytopenia New. Currently 109. Possibly secondary to splenic sequestration in the setting of sepsis.  Trending cell counts daily CBC.   #Chronic anemia -No signs of active bleeding.  -Avoiding Fe given potential bacteremia -Per Nephrology, start ESA if Hgb < 10   -Continuing to trend cell counts daily with CBC.  #OSA -Not on CPAP   Best Practice (right click and "Reselect all SmartList Selections" daily)   Diet/type: NPO w/ oral meds DVT prophylaxis: prophylactic heparin  GI prophylaxis: N/A Lines: Left femoral TDC Foley:  N/A Code Status:  full code Last date of multidisciplinary goals of care discussion [10/23]  Labs   CBC: Recent Labs  Lab 04/01/23 1633 04/02/23 0111 04/02/23 0119 04/02/23 0203 04/02/23 0418  WBC 5.2  --  2.4*  --  5.0  NEUTROABS 4.1  --   --   --   --   HGB 10.4* 12.6* 11.2* 10.5* 10.3*  HCT 32.8* 37.0* 35.9* 31.0* 31.8*  MCV 86.8  --  88.2  --  86.2  PLT 115*  --  127*  119*  --  109*    Basic Metabolic Panel: Recent Labs  Lab 04/01/23 1633 04/01/23 2302 04/02/23 0111 04/02/23 0119 04/02/23 0203 04/02/23 0418  NA 132* 133* 135 133* 134* 133*  K 6.7* 6.0* 6.0* 5.8* 5.5* 5.2*  CL 97* 96*  --  98  --  96*  CO2 17* 19*  --  13*  --  20*  GLUCOSE 103* 114*  --  131*  --  111*  BUN 94* 89*  --  93*  --  93*  CREATININE 17.66* 17.06*  --  17.29*  --  17.59*  CALCIUM 8.8* 8.8*  --  9.2  --  8.5*  MG  --   --   --  2.2  --  1.9  PHOS  --   --   --  5.8*  --  4.1   GFR: Estimated Creatinine Clearance: 7 mL/min (A) (by C-G formula based on SCr of 17.59 mg/dL (H)). Recent Labs  Lab 04/01/23 1633 04/01/23 1649 04/01/23 2125 04/02/23 0117 04/02/23 0119 04/02/23 0418  WBC 5.2  --   --   --  2.4* 5.0  LATICACIDVEN  --  2.3* 1.0 8.5*  --  1.2    Liver Function Tests: Recent Labs  Lab 04/01/23 1633 04/02/23 0119 04/02/23 0418  AST 17  --   --   ALT 14  --   --   ALKPHOS 68  --   --   BILITOT 0.7  --   --   PROT 7.2  --   --   ALBUMIN 3.3* 3.5 3.0*   No results for input(s): "LIPASE", "AMYLASE" in the last 168 hours. No results for input(s): "AMMONIA" in the last 168 hours.  ABG    Component Value Date/Time   PHART 7.400 04/02/2023 0203   PCO2ART 30.1 (L) 04/02/2023 0203   PO2ART 109  (H) 04/02/2023 0203   HCO3 18.3 (L) 04/02/2023 0203   TCO2 19 (L) 04/02/2023 0203   ACIDBASEDEF 5.0 (H) 04/02/2023 0203   O2SAT 98 04/02/2023 0203     Coagulation Profile: Recent Labs  Lab 04/01/23 1700 04/02/23 0119  INR 1.9* 1.8*    Cardiac Enzymes: No results for input(s): "CKTOTAL", "CKMB", "CKMBINDEX", "TROPONINI" in the last 168 hours.  HbA1C: Hgb A1c MFr Bld  Date/Time Value Ref Range Status  02/18/2023 03:25 PM 5.2 4.6 - 6.5 % Final    Comment:    Glycemic Control Guidelines for People with Diabetes:Non Diabetic:  <6%Goal of Therapy: <7%Additional Action Suggested:  >8%   12/06/2022 10:53 AM 5.7 (H) 4.8 - 5.6 % Final    Comment:    (NOTE)         Prediabetes: 5.7 - 6.4         Diabetes: >6.4         Glycemic control for adults with diabetes: <7.0     CBG: Recent Labs  Lab 04/01/23 2355 04/02/23 0401  GLUCAP 102* 109*    Review of Systems:   As per HPI  Past Medical History:  He,  has a past medical history of Acute respiratory failure with hypoxia (HCC) (11/30/2018), Anemia, ESRD on hemodialysis (HCC) (06/07/2011), History of blood transfusion, Hypertension, Morbid obesity (HCC), Paroxysmal atrial fibrillation (HCC), Pre-diabetes, Sepsis (HCC) (11/30/2018), and Sleep apnea.   Surgical History:   Past Surgical History:  Procedure Laterality Date   ALLOGRAFT APPLICATION Right 02/01/2023   Procedure: ALLOGRAFT [facility];  Surgeon: Edwin Cap, DPM;  Location: [facility] OR;  Service: Orthopedics/Podiatry;  Laterality: Right;   AMPUTATION Right 12/12/2022   Procedure: AMPUTATION RAY;  Surgeon: Louann Sjogren, DPM;  Location: [facility] OR;  Service: Orthopedics/Podiatry;  Laterality: Right;  surgical team to do local block   APPLICATION OF WOUND VAC Right 02/01/2023   Procedure: APPLICATION OF WOUND VAC;  Surgeon: Edwin Cap, DPM;  Location: [facility] OR;  Service: Orthopedics/Podiatry;  Laterality: Right;   ARTERIOVENOUS GRAFT PLACEMENT  08/09/2010   Left  Thigh Graft by Dr. Cari Caraway  AV FISTULA PLACEMENT Right 05/18/2018   Procedure: INSERTION OF ARTERIOVENOUS (AV) GORE-TEX GRAFT Left THIGH;  Surgeon: Maeola Harman, MD;  Location: [facility] OR;  Service: Vascular;  Laterality: Right;   AV FISTULA PLACEMENT Right 02/15/2021   Procedure: INSERTION OF ARTERIOVENOUS (AV) GORE-TEX LOOP GRAFT RIGHT THIGH;  Surgeon: Maeola Harman, MD;  Location: [facility] OR;  Service: Vascular;  Laterality: Right;   BIOPSY  05/20/2022   Procedure: BIOPSY;  Surgeon: Shellia Cleverly, DO;  Location: [facility];  Service: Gastroenterology;;   CAPD INSERTION N/A 06/13/2022   Procedure: LAPAROSCOPIC INSERTION CONTINUOUS AMBULATORY PERITONEAL DIALYSIS  (CAPD) CATHETER;  Surgeon: Maeola Harman, MD;  Location: [facility] OR;  Service: Vascular;  Laterality: N/A;   CAPD REMOVAL  05/08/2011   Procedure: CONTINUOUS AMBULATORY PERITONEAL DIALYSIS  (CAPD) CATHETER REMOVAL;  Surgeon: Iona Coach, MD;  Location: [facility] OR;  Service: General;  Laterality: N/A;  Removal of CAPD catheter, Dr. requests to go after 100   CAPD REMOVAL N/A 08/08/2022   Procedure: REMOVAL CONTINUOUS AMBULATORY PERITONEAL DIALYSIS  (CAPD) CATHETER;  Surgeon: Maeola Harman, MD;  Location: [facility] OR;  Service: Vascular;  Laterality: N/A;   COLONOSCOPY WITH PROPOFOL N/A 05/20/2022   Procedure: COLONOSCOPY WITH PROPOFOL;  Surgeon: Shellia Cleverly, DO;  Location: [facility];  Service: Gastroenterology;  Laterality: N/A;   I & D EXTREMITY Right 12/08/2022   Procedure: IRRIGATION AND DEBRIDEMENT RIGHT FOOT;  Surgeon: Louann Sjogren, DPM;  Location: [facility] OR;  Service: Orthopedics/Podiatry;  Laterality: Right;   I & D EXTREMITY Right 02/01/2023   Procedure: IRRIGATION AND DEBRIDEMENT RIGHT FOOT;  Surgeon: Edwin Cap, DPM;  Location: [facility] OR;  Service: Orthopedics/Podiatry;  Laterality: Right;   INSERTION OF DIALYSIS CATHETER  10/05/2010   Right Femoral Cath insertion by Dr. Leonides Sake.  Pt ahas had several caths inserted.   INSERTION OF DIALYSIS CATHETER Right 02/15/2021   Procedure: Attempted INSERTION OF RIGHT and Left Internal Jugular DIALYSIS CATHETER, Insertion of Left Femoral Vein Dialysis Catheter;  Surgeon: Maeola Harman, MD;  Location: [facility] OR;  Service: Vascular;  Laterality: Right;   INSERTION OF DIALYSIS CATHETER Right 04/26/2021   Procedure: INSERTION OF TUNNELED 55cm PALIDROME PRECISION CHRONIC DIALYSIS CATHETER;  Surgeon: Leonie Douglas, MD;  Location: [facility] OR;  Service: Vascular;  Laterality: Right;   INSERTION OF DIALYSIS CATHETER Left 12/26/2021   Procedure: INSERTION OF TUNNELED  DIALYSIS CATHETER LEFT FEMORAL ARTERY;  Surgeon: Maeola Harman, MD;  Location: [facility] OR;  Service: Vascular;  Laterality: Left;   INSERTION OF DIALYSIS CATHETER Left 03/06/2022   Procedure: INSERTION OF DIALYSIS CATHETER;  Surgeon: Maeola Harman, MD;  Location: [facility] OR;  Service: Vascular;  Laterality: Left;   INSERTION OF DIALYSIS CATHETER Left 12/31/2022   Procedure: INSERTION OF DIALYSIS CATHETER LEFT GROIN;  Surgeon: Leonie Douglas, MD;  Location: [facility] OR;  Service: Vascular;  Laterality: Left;   IR FLUORO GUIDE CV LINE LEFT  04/23/2022   IR FLUORO GUIDE CV LINE LEFT  01/29/2023   IR FLUORO GUIDE CV LINE LEFT  02/02/2023   IR FLUORO GUIDE CV LINE RIGHT  05/11/2018   IR FLUORO GUIDE CV LINE RIGHT  07/03/2021   IR FLUORO GUIDE CV LINE RIGHT  07/16/2021   IR PTA ADDL CENTRAL DIALYSIS SEG THRU DIALY CIRCUIT RIGHT Right 07/03/2021   IR PTA AND STENT ADDL CENTRAL DIALY SEG THRU DIALY CIRCUIT LEFT Left 02/02/2023   IR US GUIDE VASC ACCESS LEFT  04/23/2022  IR US GUIDE VASC ACCESS RIGHT  05/11/2018   IR VENO/EXT/UNI LEFT  04/23/2022   IR VENO/EXT/UNI LEFT  01/29/2023   IR VENOCAVAGRAM IVC  07/16/2021   IR VENOCAVAGRAM IVC  02/02/2023   KIDNEY TRANSPLANT  2014   KNEE ARTHROSCOPY Left    LAPAROSCOPIC LYSIS OF ADHESIONS N/A 06/13/2022   Procedure: LAPAROSCOPIC  LYSIS OF ADHESIONS;  Surgeon: Maeola Harman, MD;  [facility] OR;  Service: Vascular;  Laterality: N/A;   POLYPECTOMY  05/20/2022   Procedure: POLYPECTOMY;  Surgeon: Shellia Cleverly, DO;  Location: [facility];  Service: Gastroenterology;;   THROMBECTOMY W/ EMBOLECTOMY Right 04/26/2021   Procedure: REMOVAL OF RIGHT THIGH ARTERIOVENOUS GORE-TEX GRAFT AND REPAIR OF RIGHT COMMON FEMORAL ARTERY;  Surgeon: Leonie Douglas, MD;  Location: [facility] OR;  Service: Vascular;  Laterality: Right;   UPPER EXTREMITY ANGIOGRAPHY Bilateral 05/13/2018   Procedure: UPPER EXTREMITY ANGIOGRAPHY - bilarteral;  Surgeon: Cephus Shelling, MD;  Location: [facility] INVASIVE CV LAB;  Service: Cardiovascular;  Laterality: Bilateral;     Social History:   reports that he quit smoking about 7 years ago. His smoking use included cigarettes. He has never used smokeless tobacco. He reports that he does not currently use alcohol. He reports that he does not currently use drugs after having used the following drugs: Marijuana.   Family History:  His family history includes Diabetes in his father; Stroke in his father and maternal grandmother. There is no history of Anesthesia problems.   Allergies No Known Allergies   Home Medications  Prior to Admission medications   Medication Sig Start Date End Date Taking? Authorizing Provider  apixaban (ELIQUIS) 5 MG TABS tablet Take 5 mg by mouth 2 (two) times daily. 10/10/21  Yes [provider]  atorvastatin (LIPITOR) 40 MG tablet Take 1 tablet (40 mg total) by mouth daily. 02/20/23  Yes [name], MD  Calcium Carb-Cholecalciferol (CALCIUM 600 + D PO) Take 1 tablet by mouth daily.   Yes [provider]  ferric citrate (AURYXIA) 1 GM 210 MG(Fe) tablet Take 210 mg by mouth 3 (three) times daily with meals. 09/06/19  Yes [provider]  midodrine (PROAMATINE) 5 MG tablet Take 15 mg by mouth 3 (three) times daily. 01/20/23  Yes [provider]  Tenapanor HCl, CKD, (XPHOZAH) 30 MG TABS Take 30 mg by mouth in the morning and at bedtime.   Yes [provider]  B Complex-C-Zn-Folic Acid (DIALYVITE/ZINC) TABS Take 1 tablet by mouth daily. Patient not taking: Reported on 04/01/2023 01/16/22   [provider]     Critical care time: 41

## 2023-04-02 NOTE — Procedures (Signed)
Interventional Radiology Procedure Note   Hx: 47 yo male with chronic kidney disease, on HD.  Upper and lower extremity access points are limited, and he has been referred previously for trans-hepatic catheter.   The former left femoral catheter was placed 02/02/23, after recanalization and stenting of occluded iliac vein. He also has IVC occlusion.   This access site is his remaining access.   Procedure: Replacement of left tunneled HD catheter.  28cm.    Complications: None  Recommendations:  - Ok to use catheter - Do not submerge - Routine line care  - tip culture   Signed,  Yvone Neu. Loreta Ave, DO, ABVM, RPVI

## 2023-04-03 ENCOUNTER — Inpatient Hospital Stay (HOSPITAL_COMMUNITY): Payer: Medicare HMO

## 2023-04-03 DIAGNOSIS — A419 Sepsis, unspecified organism: Secondary | ICD-10-CM | POA: Diagnosis not present

## 2023-04-03 DIAGNOSIS — R6521 Severe sepsis with septic shock: Secondary | ICD-10-CM | POA: Diagnosis not present

## 2023-04-03 DIAGNOSIS — R7881 Bacteremia: Secondary | ICD-10-CM | POA: Diagnosis not present

## 2023-04-03 LAB — ECHOCARDIOGRAM COMPLETE
Area-P 1/2: 3.33 cm2
Height: 74 in
S' Lateral: 3.3 cm
Weight: 4864.23 [oz_av]

## 2023-04-03 LAB — RENAL FUNCTION PANEL
Albumin: 2.7 g/dL — ABNORMAL LOW (ref 3.5–5.0)
Albumin: 2.6 g/dL — ABNORMAL LOW (ref 3.5–5.0)
Anion gap: 13 (ref 5–15)
Anion gap: 10 (ref 5–15)
BUN: 65 mg/dL — ABNORMAL HIGH (ref 6–20)
BUN: 57 mg/dL — ABNORMAL HIGH (ref 6–20)
CO2: 22 mmol/L (ref 22–32)
CO2: 23 mmol/L (ref 22–32)
Calcium: 8.2 mg/dL — ABNORMAL LOW (ref 8.9–10.3)
Calcium: 8.1 mg/dL — ABNORMAL LOW (ref 8.9–10.3)
Chloride: 97 mmol/L — ABNORMAL LOW (ref 98–111)
Chloride: 99 mmol/L (ref 98–111)
Creatinine, Ser: 11.61 mg/dL — ABNORMAL HIGH (ref 0.61–1.24)
Creatinine, Ser: 10.13 mg/dL — ABNORMAL HIGH (ref 0.61–1.24)
GFR, Estimated: 5 mL/min — ABNORMAL LOW (ref >60)
GFR, Estimated: 6 mL/min — ABNORMAL LOW (ref >60)
Glucose, Bld: 94 mg/dL (ref 70–99)
Glucose, Bld: 112 mg/dL — ABNORMAL HIGH (ref 70–99)
Phosphorus: 4 mg/dL (ref 2.5–4.6)
Phosphorus: 5.2 mg/dL — ABNORMAL HIGH (ref 2.5–4.6)
Potassium: 4.5 mmol/L (ref 3.5–5.1)
Potassium: 4.7 mmol/L (ref 3.5–5.1)
Sodium: 132 mmol/L — ABNORMAL LOW (ref 135–145)
Sodium: 132 mmol/L — ABNORMAL LOW (ref 135–145)

## 2023-04-03 LAB — CBC
HCT: 29.4 % — ABNORMAL LOW (ref 39.0–52.0)
Hemoglobin: 9.2 g/dL — ABNORMAL LOW (ref 13.0–17.0)
MCH: 27.1 pg (ref 26.0–34.0)
MCHC: 31.3 g/dL (ref 30.0–36.0)
MCV: 86.7 fL (ref 80.0–100.0)
Platelets: 107 10*3/uL — ABNORMAL LOW (ref 150–400)
RBC: 3.39 MIL/uL — ABNORMAL LOW (ref 4.22–5.81)
RDW: 17.2 % — ABNORMAL HIGH (ref 11.5–15.5)
WBC: 6.2 10*3/uL (ref 4.0–10.5)
nRBC: 0 % (ref 0.0–0.2)

## 2023-04-03 LAB — IRON AND TIBC
Iron: 42 ug/dL — ABNORMAL LOW (ref 45–182)
Saturation Ratios: 24 % (ref 17.9–39.5)
TIBC: 176 ug/dL — ABNORMAL LOW (ref 250–450)
UIBC: 134 ug/dL

## 2023-04-03 LAB — MAGNESIUM: Magnesium: 2.3 mg/dL (ref 1.7–2.4)

## 2023-04-03 LAB — GLUCOSE, CAPILLARY
Glucose-Capillary: 85 mg/dL (ref 70–99)
Glucose-Capillary: 91 mg/dL (ref 70–99)
Glucose-Capillary: 135 mg/dL — ABNORMAL HIGH (ref 70–99)
Glucose-Capillary: 109 mg/dL — ABNORMAL HIGH (ref 70–99)
Glucose-Capillary: 116 mg/dL — ABNORMAL HIGH (ref 70–99)

## 2023-04-03 LAB — APTT: aPTT: 33 s (ref 24–36)

## 2023-04-03 LAB — FERRITIN: Ferritin: 323 ng/mL (ref 24–336)

## 2023-04-03 LAB — HEPATITIS B SURFACE ANTIGEN: Hepatitis B Surface Ag: NONREACTIVE

## 2023-04-03 MED ORDER — DARBEPOETIN ALFA 60 MCG/0.3ML IJ SOSY
60.0000 ug | PREFILLED_SYRINGE | INTRAMUSCULAR | Status: DC
Start: 2023-04-04 — End: 2023-04-06
  Administered 2023-04-04: 60 ug via SUBCUTANEOUS
  Filled 2023-04-03: qty 0.3

## 2023-04-03 MED ORDER — APIXABAN 5 MG PO TABS
5.0000 mg | ORAL_TABLET | Freq: Two times a day (BID) | ORAL | Status: DC
  Administered 2023-04-03 – 2023-04-06 (×7): 5 mg via ORAL
  Filled 2023-04-03 (×7): qty 1

## 2023-04-03 MED ORDER — POTASSIUM CHLORIDE 2 MEQ/ML IV SOLN
INTRAVENOUS | Status: DC
  Filled 2023-04-03 (×3): qty 5000

## 2023-04-03 MED ORDER — PRISMASOL BGK 0/2.5 32-2.5 MEQ/L EC SOLN
Status: DC
  Filled 2023-04-03 (×3): qty 5000

## 2023-04-03 MED ORDER — ACETAMINOPHEN 500 MG PO TABS: 1000.0000 mg | ORAL_TABLET | Freq: Four times a day (QID) | ORAL | Status: DC | PRN

## 2023-04-03 MED ORDER — CHLORHEXIDINE GLUCONATE CLOTH 2 % EX PADS: 6.0000 | MEDICATED_PAD | Freq: Every day | CUTANEOUS | Status: DC

## 2023-04-03 MED ORDER — SODIUM CHLORIDE 0.9 % IV SOLN
1.0000 g | INTRAVENOUS | Status: DC
  Administered 2023-04-03 – 2023-04-05 (×3): 1 g via INTRAVENOUS
  Filled 2023-04-03 (×4): qty 10

## 2023-04-03 NOTE — Progress Notes (Signed)
[facility] Progress Note    Assessment/ Plan:   Septic shock presumably catheter related infection: abx and pressor support per primary service. Hasn't taken outpatient midodrine in a month. Bcx 10/23 positive for enterobacterales. On cefepime   Hyperkalemia: improved with CRRT. K 4.5   ESRD -Outpatient orders: [facility].  TTS.  4.5 hours.  EDW 137 kg.  TDC.  Flow rates: 500/500.  2K/2 calcium.  Heparin: 5000 units bolus, 2500 units intermittent.  Meds: Hectorol 2 mcg with treatment, Parsabiv 7.5 mg with treatment. Not on ESA or Fe currently -[facility] exchanges 10/24. CRRT started 10/24. Currently running well. Discussed with pharmacy, changing bags to all 2k. Plan for CRRT until this evening/change of shift. Plan for HD tomorow   Anemia of ESRD: Hemoglobin 9.2. Avoiding iron. Transfuse PRN for Hgb <7. Checking Fe panel. Will dose ESA for 10/25   CKD-metabolic Bone Disease: PO4 acceptable, resume home binders  Thrombocytopenia: per primary service, likely related to sepsis?  Access: -no good access points therefore cannot do line holiday, s/p [facility] exchange 10/24 with IR  Subjective:   Patient seen and examined bedside in ICU/on CRRT. No acute events. Tolerating CRRT. On low dose pressors. S/p catheter exchange yesterday   Objective:   BP 128/84   Pulse 63   Temp 98 F (36.7 C) (Oral)   Resp 13   Ht 6\' 2"  (1.88 m)   Wt (!) 137.9 kg   SpO2 97%   BMI 39.03 kg/m   Intake/Output Summary (Last 24 hours) at 04/03/2023 0919 Last data filed at 04/03/2023 0900 Gross per 24 hour  Intake 1531.92 ml  Output 2130 ml  Net -598.08 ml   Weight change: -3.6 kg  Physical Exam: Gen: NAD CVS: RRR Resp: CTA B/L Abd: obese, soft Ext: no sig edema b/l Les Neuro: awake, alert Dialysis access: left fem TDC  Imaging: IR Fluoro Guide CV Line Left  Result Date: 04/02/2023 INDICATION: 47 year old male with bacteremia/sepsis, referred for possible left femoral cath line  holiday or exchange for alternative site. The patient has previously been evaluated for possible transhepatic hemodialysis catheter, as he has no upper extremity, no right femoral, and limited left femoral access. IVC occlusion. Previously the patient required left common iliac vein recanalization/stenting to improve flow for the left iliac hemodialysis catheter. Plan is for exchange, as the current line cannot be removed. EXAM: IMAGE GUIDED EXCHANGE OF LEFT FEMORAL TUNNELED HEMODIALYSIS CATHETER MEDICATIONS: None ANESTHESIA/SEDATION: None FLUOROSCOPY TIME:  Fluoroscopy Time:  (5 mGy). COMPLICATIONS: None PROCEDURE: Informed written consent was obtained from the patient after a discussion of the risks, benefits, and alternatives to treatment. Questions regarding the procedure were encouraged and answered. The left inguinal region and indwelling catheter were prepped with chlorhexidine in a sterile fashion, and a sterile drape was applied covering the operative field. Maximum barrier sterile technique with sterile gowns and gloves were used for the procedure. A timeout was performed prior to the initiation of the procedure. Once the patient is prepped and draped, 1% lidocaine was used for local anesthesia. Scout images were acquired. Stiff Glidewire was advanced through the catheter into the lower IVC. Retention suture was ligated, blunt dissection was used to free the catheter, and the catheter wrist exchanged on the Glidewire. A new 28 cm palindrome catheter was placed. Aspiration of the blue and red hub confirmed function. Hep-Lock was administered. Catheter was sutured in position.  Final image was stored. Dressings were applied. The patient tolerated the procedure well without immediate post  procedural complication. IMPRESSION: Status post image guided exchange of a presumed infected left femoral tunneled hemodialysis catheter, as above with new 28 cm. Signed, Yvone Neu. Miachel Roux, RPVI Vascular and  [facility] Radiology Electronically Signed   By: Gilmer Mor D.O.   On: 04/02/2023 14:46   DG CHEST PORT 1 VIEW  Result Date: 04/02/2023 CLINICAL DATA:  Respiratory distress EXAM: PORTABLE CHEST 1 VIEW COMPARISON:  04/01/2023 FINDINGS: Cardiac shadow is within normal limits. Lungs are well aerated bilaterally. No focal infiltrate or effusion is seen. Patient is somewhat rotated to the left accentuating the mediastinal markings particularly in the azygous vein. The azygous may be somewhat prominent due to collateral flow seen on prior exams. No bony abnormality is noted. IMPRESSION: No acute abnormality noted. Prominent azygous vein related to collateral venous pathways. Electronically Signed   By: Alcide Clever M.D.   On: 04/02/2023 01:43   CT EXTREMITY LOWER LEFT WO CONTRAST  Result Date: 04/01/2023 CLINICAL DATA:  Soft tissue infection suspected. EXAM: CT OF THE LOWER LEFT EXTREMITY WITHOUT CONTRAST TECHNIQUE: Multidetector CT imaging of the lower left extremity was performed according to the standard protocol. RADIATION DOSE REDUCTION: This exam was performed according to the departmental dose-optimization program which includes automated exposure control, adjustment of the mA and/or kV according to patient size and/or use of iterative reconstruction technique. COMPARISON:  None Available. FINDINGS: Bones/Joint/Cartilage No acute fracture or focal osseous lesion. There is a moderate-sized knee joint effusion. No significant hip joint effusion identified. There are moderate degenerative changes of the knee. Ligaments Suboptimally assessed by CT. Muscles and Tendons No intramuscular fluid collection, air or mass identified. Deep fascial planes appear preserved. Soft tissues Peripheral vascular calcifications are present. Left femoral catheter is present, partially imaged. No subcutaneous fluid collection or air identified. There is a calcified subcutaneous tract in  the anterior thigh which may be related to prior procedure or prior catheter placement. Alternatively this may represent a calcified peripheral vessel. IMPRESSION: 1. No acute osseous abnormality. 2. Moderate-sized knee joint effusion. 3. No subcutaneous fluid collection or air identified. 4. Peripheral vascular disease. Electronically Signed   By: Darliss Cheney M.D.   On: 04/01/2023 22:58   DG Chest Port 1 View  Result Date: 04/01/2023 CLINICAL DATA:  Sepsis. EXAM: PORTABLE CHEST 1 VIEW COMPARISON:  January 27, 2023. FINDINGS: The heart size and mediastinal contours are within normal limits. Both lungs are clear. The visualized skeletal structures are unremarkable. IMPRESSION: No active disease. Electronically Signed   By: Lupita Raider M.D.   On: 04/01/2023 20:43    Labs: BMET Recent Labs  Lab 04/01/23 1633 04/01/23 2302 04/02/23 0111 04/02/23 0119 04/02/23 0203 04/02/23 0418 04/02/23 1722 04/03/23 0350  NA 132* 133* 135 133* 134* 133* 133* 132*  K 6.7* 6.0* 6.0* 5.8* 5.5* 5.2* 5.7* 4.5  CL 97* 96*  --  98  --  96* 97* 97*  CO2 17* 19*  --  13*  --  20* 25 22  GLUCOSE 103* 114*  --  131*  --  111* 108* 94  BUN 94* 89*  --  93*  --  93* 78* 65*  CREATININE 17.66* 17.06*  --  17.29*  --  17.59* 14.45* 11.61*  CALCIUM 8.8* 8.8*  --  9.2  --  8.5* 8.3* 8.2*  PHOS  --   --   --  5.8*  --  4.1 4.7* 4.0   CBC Recent Labs  Lab 04/01/23 1633 04/02/23  0111 04/02/23 0119 04/02/23 0203 04/02/23 0418 04/03/23 0350  WBC 5.2  --  2.4*  --  5.0 6.2  NEUTROABS 4.1  --   --   --   --   --   HGB 10.4*   < > 11.2* 10.5* 10.3* 9.2*  HCT 32.8*   < > 35.9* 31.0* 31.8* 29.4*  MCV 86.8  --  88.2  --  86.2 86.7  PLT 115*  --  127*  119*  --  109* 107*   < > = values in this interval not displayed.    Medications:     Chlorhexidine Gluconate Cloth  6 each Topical Daily   feeding supplement (NEPRO CARB STEADY)  237 mL Oral BID BM   heparin injection (subcutaneous)  5,000 Units Subcutaneous  Q8H   midodrine  15 mg Oral TID WC   multivitamin  1 tablet Oral QHS   mupirocin ointment  1 Application Nasal BID      Anthony Sar, MD [facility] 04/03/2023, 9:19 AM

## 2023-04-03 NOTE — Progress Notes (Signed)
NAME:  [name], MRN:  [id number], DOB:  1976/01/05, LOS: 2 ADMISSION DATE:  04/01/2023, CONSULTATION DATE:  04/03/2023 REFERRING MD:  Dr. Rush Landmark, CHIEF COMPLAINT:  Chills, N/V  History of Present Illness:  47 y.o. male with a known history of end-stage renal disease on renal replacement therapy. He does have a prior history of failed renal transplant. Chronically patient is on systemic anticoagulation with Eliquis for atrial fibrillation. He has a known history of chronic hypotension for which she takes midodrine. Patient presents with a cough as well as nausea, vomiting, and diarrhea for 1 day. He has been able to keep down some fluids. He denies any frank abdominal pain. He has had no subjective fever. He denies any headache or near syncope. In the emergency department the patient had questionable drainage from left femoral dialysis access site which was placed by interventional radiology in August 2024. Notably patient was hospitalized in August 2024 and treated for right foot osteomyelitis. Patient was also treated for a Proteus bacteremia. In the emergency department the patient had a low-grade fever. His blood pressure was fluctuating intermittently. He was noted to have hyperkalemia with metabolic acidosis that prompted nephrology consultation. Patient was initiated on vancomycin, cefepime, and Flagyl in the ED and critical care was consulted. At the time of my evaluation patient denied any difficulty breathing or wheezing. He denied any acute vision changes.   Pertinent  Medical History   Past Medical History:  Diagnosis Date   Acute respiratory failure with hypoxia (HCC) 11/30/2018   Anemia    ESRD   ESRD on hemodialysis (HCC) 06/07/2011   TTS [place]. Started HD in 2006, got transplant in 2014 lasted until Dec 2019 then went back on HD.  Has L thigh AVG as of Jun 2020.  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    History of blood transfusion    Hypertension     03/19/22- has not had high blood pressure in 5 years.   Morbid obesity (HCC)    Paroxysmal atrial fibrillation (HCC)    Pre-diabetes    no meds   Sepsis (HCC) 11/30/2018   Sleep apnea    lost weight, does not use CPAP     Significant Hospital Events: Including procedures, antibiotic start and stop dates in addition to other pertinent events   Cefepime, Flagyl, and Vanc. started 10/23 Flagyl stopped 10/23 Vancomycin stopped 10/24  Interim History / Subjective:  -Patient had 5-7 beat run of wide-complex tacycardia with spontaneous termination last night; K+ was 5.7, Mg 1.9 -Patient without complaints this morning  Objective   Blood pressure 128/84, pulse 69, temperature 98 F (36.7 C), temperature source Oral, resp. rate 13, height 6\' 2"  (1.88 m), weight (!) 137.9 kg, SpO2 98%.        Intake/Output Summary (Last 24 hours) at 04/03/2023 0735 Last data filed at 04/03/2023 0700 Gross per 24 hour  Intake 1331.88 ml  Output 1955 ml  Net -623.12 ml   Filed Weights   04/01/23 1613 04/03/23 0353  Weight: (!) 141.5 kg (!) 137.9 kg    Physical Exam: Constitutional: obese, lying in bed, in no acute distress, on 2 L [place] HENT: normocephalic atraumatic, mucous membranes moist Eyes: conjunctiva non-erythematous Cardiovascular: regular rate and rhythm, no m/r/g Pulmonary/Chest: normal work of breathing on room air, lungs clear to auscultation bilaterally Abdominal: soft, non-tender, non-distended MSK: normal bulk and tone Neurological: alert & oriented x 3, no focal deficit Skin: warm and dry, left  formal TDC (replaced yesterday) without: pain, surrounding erythema, drainage, no rashes Psych: normal mood and behavior   Resolved Hospital Problem list   N/A  Assessment & Plan:  #Sepsis  -BCID positive for 4/10 with enterobacterales, final result pending -Repeat BC NGTD -Tip culture pending -Potential source is [facility] which IR removed yesterday -Vitals stable, no leukocytosis -TTE  performed, results pending -Continuing Cefepime (Day 3) -CT of the left lower extremity shows no acute osseous abnormality, moderate-sized knee joint effusion, no subcutaneous fluid collection or air identified, peripheral vascular disease. -CXR unremarkable    #Possible shock -Patient with known chronic hypotension treated with midodrine.  Had been off midodrine for 1 month.  Restarted home midodrine.   -Placed left radial arterial line to more closely monitor blood pressure. Serum Cortisol normal, but this was collected at 6:33 pm so limited utility. Not on steroids. -DIC panel with PT/INR of 21.4/1.8, aPTT = 38, Fibrinogen  = 690, D-Dimer = 5.89, Platelets = 127, no schistocytes. -Continue Levophed gtt (currently 3 mcg/min) and Midodrine po 15 mg TID -Currently normotensive   #ESRD #Hyperkalemia -K+ is 4.5; Mg  = 2.3 -Continue CRRT -Continuing telemetry monitoring.   #Paroxysmal atrial fibrillation Monitoring patient on telemetry.  Home Eliquis restarted   #Thrombocytopenia New. Currently 107. Possibly secondary to splenic sequestration in the setting of sepsis.  Trending cell counts daily CBC.   #Chronic anemia -Hemoglobin 9.2 -No signs of active bleeding.  -Avoiding Fe given potential bacteremia -Per Nephrology, Checking Fe panel. Will dose ESA for 10/25  -Transfuse if Hgb < 7   #OSA -Not on CPAP  Best Practice (right click and "Reselect all SmartList Selections" daily)   Diet/type: Renal DVT prophylaxis: DOAC GI prophylaxis: N/A Lines: Left femoral TDC Foley:  N/A Code Status:  full code Last date of multidisciplinary goals of care discussion [10/23]  Labs   CBC: Recent Labs  Lab 04/01/23 1633 04/02/23 0111 04/02/23 0119 04/02/23 0203 04/02/23 0418 04/03/23 0350  WBC 5.2  --  2.4*  --  5.0 6.2  NEUTROABS 4.1  --   --   --   --   --   HGB 10.4* 12.6* 11.2* 10.5* 10.3* 9.2*  HCT 32.8* 37.0* 35.9* 31.0* 31.8* 29.4*  MCV 86.8  --  88.2  --  86.2 86.7  PLT  115*  --  127*  119*  --  109* 107*    Basic Metabolic Panel: Recent Labs  Lab 04/01/23 2302 04/02/23 0111 04/02/23 0119 04/02/23 0203 04/02/23 0418 04/02/23 1722 04/03/23 0350  NA 133*   < > 133* 134* 133* 133* 132*  K 6.0*   < > 5.8* 5.5* 5.2* 5.7* 4.5  CL 96*  --  98  --  96* 97* 97*  CO2 19*  --  13*  --  20* 25 22  GLUCOSE 114*  --  131*  --  111* 108* 94  BUN 89*  --  93*  --  93* 78* 65*  CREATININE 17.06*  --  17.29*  --  17.59* 14.45* 11.61*  CALCIUM 8.8*  --  9.2  --  8.5* 8.3* 8.2*  MG  --   --  2.2  --  1.9  --  2.3  PHOS  --   --  5.8*  --  4.1 4.7* 4.0   < > = values in this interval not displayed.   GFR: Estimated Creatinine Clearance: 11.5 mL/min (A) (by C-G formula based on SCr of 11.61 mg/dL (H)). Recent Labs  Lab 04/01/23 1633 04/01/23 1649 04/01/23 2125 04/02/23 0117 04/02/23 0119 04/02/23 0418 04/03/23 0350  WBC 5.2  --   --   --  2.4* 5.0 6.2  LATICACIDVEN  --  2.3* 1.0 8.5*  --  1.2  --     Liver Function Tests: Recent Labs  Lab 04/01/23 1633 04/02/23 0119 04/02/23 0418 04/02/23 1722 04/03/23 0350  AST 17  --   --   --   --   ALT 14  --   --   --   --   ALKPHOS 68  --   --   --   --   BILITOT 0.7  --   --   --   --   PROT 7.2  --   --   --   --   ALBUMIN 3.3* 3.5 3.0* 2.8* 2.7*   No results for input(s): "LIPASE", "AMYLASE" in the last 168 hours. No results for input(s): "AMMONIA" in the last 168 hours.  ABG    Component Value Date/Time   PHART 7.400 04/02/2023 0203   PCO2ART 30.1 (L) 04/02/2023 0203   PO2ART 109 (H) 04/02/2023 0203   HCO3 18.3 (L) 04/02/2023 0203   TCO2 19 (L) 04/02/2023 0203   ACIDBASEDEF 5.0 (H) 04/02/2023 0203   O2SAT 98 04/02/2023 0203     Coagulation Profile: Recent Labs  Lab 04/01/23 1700 04/02/23 0119  INR 1.9* 1.8*    Cardiac Enzymes: No results for input(s): "CKTOTAL", "CKMB", "CKMBINDEX", "TROPONINI" in the last 168 hours.  HbA1C: Hgb A1c MFr Bld  Date/Time Value Ref Range Status   02/18/2023 03:25 PM 5.2 4.6 - 6.5 % Final    Comment:    Glycemic Control Guidelines for People with Diabetes:Non Diabetic:  <6%Goal of Therapy: <7%Additional Action Suggested:  >8%   12/06/2022 10:53 AM 5.7 (H) 4.8 - 5.6 % Final    Comment:    (NOTE)         Prediabetes: 5.7 - 6.4         Diabetes: >6.4         Glycemic control for adults with diabetes: <7.0     CBG: Recent Labs  Lab 04/02/23 1537 04/02/23 1959 04/02/23 2327 04/03/23 0345 04/03/23 0720  GLUCAP 113* 94 80 85 91    Review of Systems:   As per HPI  Past Medical History:  He,  has a past medical history of Acute respiratory failure with hypoxia (HCC) (11/30/2018), Anemia, ESRD on hemodialysis (HCC) (06/07/2011), History of blood transfusion, Hypertension, Morbid obesity (HCC), Paroxysmal atrial fibrillation (HCC), Pre-diabetes, Sepsis (HCC) (11/30/2018), and Sleep apnea.   Surgical History:   Past Surgical History:  Procedure Laterality Date   ALLOGRAFT APPLICATION Right 02/01/2023   Procedure: ALLOGRAFT [facility];  Surgeon: Edwin Cap, DPM;  Location: [facility] OR;  Service: Orthopedics/Podiatry;  Laterality: Right;   AMPUTATION Right 12/12/2022   Procedure: AMPUTATION RAY;  Surgeon: Louann Sjogren, DPM;  Location: [facility] OR;  Service: Orthopedics/Podiatry;  Laterality: Right;  surgical team to do local block   APPLICATION OF WOUND VAC Right 02/01/2023   Procedure: APPLICATION OF WOUND VAC;  Surgeon: Edwin Cap, DPM;  Location: [facility] OR;  Service: Orthopedics/Podiatry;  Laterality: Right;   ARTERIOVENOUS GRAFT PLACEMENT  08/09/2010   Left Thigh Graft by Dr. Cari Caraway   AV FISTULA PLACEMENT Right 05/18/2018   Procedure: INSERTION OF ARTERIOVENOUS (AV) GORE-TEX GRAFT Left THIGH;  Surgeon: Maeola Harman, MD;  Location: [facility] OR;  Service: Vascular;  Laterality: Right;  AV FISTULA PLACEMENT Right 02/15/2021   Procedure: INSERTION OF ARTERIOVENOUS (AV) GORE-TEX LOOP GRAFT RIGHT THIGH;  Surgeon:  Maeola Harman, MD;  Location: [facility] OR;  Service: Vascular;  Laterality: Right;   BIOPSY  05/20/2022   Procedure: BIOPSY;  Surgeon: Shellia Cleverly, DO;  Location: [facility];  Service: Gastroenterology;;   CAPD INSERTION N/A 06/13/2022   Procedure: LAPAROSCOPIC INSERTION CONTINUOUS AMBULATORY PERITONEAL DIALYSIS  (CAPD) CATHETER;  Surgeon: Maeola Harman, MD;  Location: [facility] OR;  Service: Vascular;  Laterality: N/A;   CAPD REMOVAL  05/08/2011   Procedure: CONTINUOUS AMBULATORY PERITONEAL DIALYSIS  (CAPD) CATHETER REMOVAL;  Surgeon: Iona Coach, MD;  Location: [facility] OR;  Service: General;  Laterality: N/A;  Removal of CAPD catheter, Dr. requests to go after 100   CAPD REMOVAL N/A 08/08/2022   Procedure: REMOVAL CONTINUOUS AMBULATORY PERITONEAL DIALYSIS  (CAPD) CATHETER;  Surgeon: Maeola Harman, MD;  Location: [facility] OR;  Service: Vascular;  Laterality: N/A;   COLONOSCOPY WITH PROPOFOL N/A 05/20/2022   Procedure: COLONOSCOPY WITH PROPOFOL;  Surgeon: Shellia Cleverly, DO;  Location: [facility];  Service: Gastroenterology;  Laterality: N/A;   I & D EXTREMITY Right 12/08/2022   Procedure: IRRIGATION AND DEBRIDEMENT RIGHT FOOT;  Surgeon: Louann Sjogren, DPM;  Location: [facility] OR;  Service: Orthopedics/Podiatry;  Laterality: Right;   I & D EXTREMITY Right 02/01/2023   Procedure: IRRIGATION AND DEBRIDEMENT RIGHT FOOT;  Surgeon: Edwin Cap, DPM;  Location: [facility] OR;  Service: Orthopedics/Podiatry;  Laterality: Right;   INSERTION OF DIALYSIS CATHETER  10/05/2010   Right Femoral Cath insertion by Dr. Leonides Sake.  Pt ahas had several caths inserted.   INSERTION OF DIALYSIS CATHETER Right 02/15/2021   Procedure: Attempted INSERTION OF RIGHT and Left Internal Jugular DIALYSIS CATHETER, Insertion of Left Femoral Vein Dialysis Catheter;  Surgeon: Maeola Harman, MD;  Location: [facility] OR;  Service: Vascular;  Laterality: Right;   INSERTION OF DIALYSIS CATHETER Right  04/26/2021   Procedure: INSERTION OF TUNNELED 55cm PALIDROME PRECISION CHRONIC DIALYSIS CATHETER;  Surgeon: Leonie Douglas, MD;  Location: [facility] OR;  Service: Vascular;  Laterality: Right;   INSERTION OF DIALYSIS CATHETER Left 12/26/2021   Procedure: INSERTION OF TUNNELED  DIALYSIS CATHETER LEFT FEMORAL ARTERY;  Surgeon: Maeola Harman, MD;  Location: [facility] OR;  Service: Vascular;  Laterality: Left;   INSERTION OF DIALYSIS CATHETER Left 03/06/2022   Procedure: INSERTION OF DIALYSIS CATHETER;  Surgeon: Maeola Harman, MD;  Location: [facility] OR;  Service: Vascular;  Laterality: Left;   INSERTION OF DIALYSIS CATHETER Left 12/31/2022   Procedure: INSERTION OF DIALYSIS CATHETER LEFT GROIN;  Surgeon: Leonie Douglas, MD;  Location: [facility] OR;  Service: Vascular;  Laterality: Left;   IR FLUORO GUIDE CV LINE LEFT  04/23/2022   IR FLUORO GUIDE CV LINE LEFT  01/29/2023   IR FLUORO GUIDE CV LINE LEFT  02/02/2023   IR FLUORO GUIDE CV LINE LEFT  04/02/2023   IR FLUORO GUIDE CV LINE RIGHT  05/11/2018   IR FLUORO GUIDE CV LINE RIGHT  07/03/2021   IR FLUORO GUIDE CV LINE RIGHT  07/16/2021   IR PTA ADDL CENTRAL DIALYSIS SEG THRU DIALY CIRCUIT RIGHT Right 07/03/2021   IR PTA AND STENT ADDL CENTRAL DIALY SEG THRU DIALY CIRCUIT LEFT Left 02/02/2023   IR US GUIDE VASC ACCESS LEFT  04/23/2022   IR US GUIDE VASC ACCESS RIGHT  05/11/2018   IR VENO/EXT/UNI LEFT  04/23/2022   IR VENO/EXT/UNI LEFT  01/29/2023  IR VENOCAVAGRAM IVC  07/16/2021   IR VENOCAVAGRAM IVC  02/02/2023   KIDNEY TRANSPLANT  2014   KNEE ARTHROSCOPY Left    LAPAROSCOPIC LYSIS OF ADHESIONS N/A 06/13/2022   Procedure: LAPAROSCOPIC LYSIS OF ADHESIONS;  Surgeon: Maeola Harman, MD;  Location: [facility] OR;  Service: Vascular;  Laterality: N/A;   POLYPECTOMY  05/20/2022   Procedure: POLYPECTOMY;  Surgeon: Shellia Cleverly, DO;  Location: [facility];  Service: Gastroenterology;;   THROMBECTOMY W/ EMBOLECTOMY Right 04/26/2021   Procedure: REMOVAL  OF RIGHT THIGH ARTERIOVENOUS GORE-TEX GRAFT AND REPAIR OF RIGHT COMMON FEMORAL ARTERY;  Surgeon: Leonie Douglas, MD;  Location: [facility] OR;  Service: Vascular;  Laterality: Right;   UPPER EXTREMITY ANGIOGRAPHY Bilateral 05/13/2018   Procedure: UPPER EXTREMITY ANGIOGRAPHY - bilarteral;  Surgeon: Cephus Shelling, MD;  Location: [facility] INVAS[facility] LAB;  Service: Cardiovascular;  Laterality: Bilateral;     Social History:   reports that he quit smoking about 7 years ago. His smoking use included cigarettes. He has never used smokeless tobacco. He reports that he does not currently use alcohol. He reports that he does not currently use drugs after having used the following drugs: Marijuana.   Family History:  His family history includes Diabetes in his father; Stroke in his father and maternal grandmother. There is no history of Anesthesia problems.   Allergies No Known Allergies   Home Medications  Prior to Admission medications   Medication Sig Start Date End Date Taking? Authorizing Provider  apixaban (ELIQUIS) 5 MG TABS tablet Take 5 mg by mouth 2 (two) times daily. 10/10/21  Yes [provider]  atorvastatin (LIPITOR) 40 MG tablet Take 1 tablet (40 mg total) by mouth daily. 02/20/23  Yes [name], MD  Calcium Carb-Cholecalciferol (CALCIUM 600 + D PO) Take 1 tablet by mouth daily.   Yes [provider]  ferric citrate (AURYXIA) 1 GM 210 MG(Fe) tablet Take 210 mg by mouth 3 (three) times daily with meals. 09/06/19  Yes [provider]  midodrine (PROAMATINE) 5 MG tablet Take 15 mg by mouth 3 (three) times daily. 01/20/23  Yes [provider]  Tenapanor HCl, CKD, (XPHOZAH) 30 MG TABS Take 30 mg by mouth in the morning and at bedtime.   Yes [provider]  B Complex-C-Zn-Folic Acid (DIALYVITE/ZINC) TABS Take 1 tablet by mouth daily. Patient not taking: Reported on 04/01/2023 01/16/22   [provider]     Critical care time: 57

## 2023-04-03 NOTE — Progress Notes (Signed)
Echocardiogram 2D Echocardiogram has been performed.  Warren Lacy Brookelle Pellicane RDCS 04/03/2023, 9:45 AM

## 2023-04-03 NOTE — Plan of Care (Signed)

## 2023-04-03 NOTE — Progress Notes (Signed)
Pharmacy Antibiotic Note  [name] is a 47 y.o. male admitted on 04/01/2023 with sepsis.  Pharmacy has been consulted for cefepime dosing.  Patient came off of CRRT before 1700 today 10/25 per RN.  He received cefepime 2gm IV around 0900.  Plan: Change cefepime to 1gm IV Q24H starting tonight F/U Renal's plan for iHD  Height: 6\' 2"  (188 cm) Weight: (!) 137.9 kg (304 lb 0.2 oz) IBW/kg (Calculated) : 82.2  Temp (24hrs), Avg:98 F (36.7 C), Min:97.6 F (36.4 C), Max:98.5 F (36.9 C)  Recent Labs  Lab 04/01/23 1633 04/01/23 1649 04/01/23 2125 04/01/23 2302 04/02/23 0117 04/02/23 0119 04/02/23 0418 04/02/23 1722 04/03/23 0350 04/03/23 1614  WBC 5.2  --   --   --   --  2.4* 5.0  --  6.2  --   CREATININE 17.66*  --   --    < >  --  17.29* 17.59* 14.45* 11.61* 10.13*  LATICACIDVEN  --  2.3* 1.0  --  8.5*  --  1.2  --   --   --    < > = values in this interval not displayed.    Estimated Creatinine Clearance: 13.2 mL/min (A) (by C-G formula based on SCr of 10.13 mg/dL (H)).    No Known Allergies   Vancomycin 10/23 x1 Metronidazole 10/23 x1  Cefepime 10/23 >>   10/23 BCx: Enterobacterales  10/23 MRSA PCR: positive   Gaetana Kawahara D. Laney Potash, PharmD, BCPS, BCCCP 04/03/2023, 7:36 PM

## 2023-04-04 DIAGNOSIS — R6521 Severe sepsis with septic shock: Secondary | ICD-10-CM | POA: Diagnosis not present

## 2023-04-04 DIAGNOSIS — A419 Sepsis, unspecified organism: Secondary | ICD-10-CM | POA: Diagnosis not present

## 2023-04-04 LAB — RENAL FUNCTION PANEL
Albumin: 2.6 g/dL — ABNORMAL LOW (ref 3.5–5.0)
Albumin: 2.6 g/dL — ABNORMAL LOW (ref 3.5–5.0)
Albumin: 2.6 g/dL — ABNORMAL LOW (ref 3.5–5.0)
Anion gap: 14 (ref 5–15)
Anion gap: 11 (ref 5–15)
Anion gap: 14 (ref 5–15)
BUN: 63 mg/dL — ABNORMAL HIGH (ref 6–20)
BUN: 65 mg/dL — ABNORMAL HIGH (ref 6–20)
BUN: 66 mg/dL — ABNORMAL HIGH (ref 6–20)
CO2: 23 mmol/L (ref 22–32)
CO2: 23 mmol/L (ref 22–32)
CO2: 23 mmol/L (ref 22–32)
Calcium: 8.4 mg/dL — ABNORMAL LOW (ref 8.9–10.3)
Calcium: 8 mg/dL — ABNORMAL LOW (ref 8.9–10.3)
Calcium: 7.9 mg/dL — ABNORMAL LOW (ref 8.9–10.3)
Chloride: 98 mmol/L (ref 98–111)
Chloride: 100 mmol/L (ref 98–111)
Chloride: 99 mmol/L (ref 98–111)
Creatinine, Ser: 11.28 mg/dL — ABNORMAL HIGH (ref 0.61–1.24)
Creatinine, Ser: 12.17 mg/dL — ABNORMAL HIGH (ref 0.61–1.24)
Creatinine, Ser: 12.3 mg/dL — ABNORMAL HIGH (ref 0.61–1.24)
GFR, Estimated: 5 mL/min — ABNORMAL LOW (ref >60)
GFR, Estimated: 5 mL/min — ABNORMAL LOW (ref >60)
GFR, Estimated: 5 mL/min — ABNORMAL LOW (ref >60)
Glucose, Bld: 89 mg/dL (ref 70–99)
Glucose, Bld: 80 mg/dL (ref 70–99)
Glucose, Bld: 95 mg/dL (ref 70–99)
Phosphorus: 6.2 mg/dL — ABNORMAL HIGH (ref 2.5–4.6)
Phosphorus: 6.1 mg/dL — ABNORMAL HIGH (ref 2.5–4.6)
Phosphorus: 5.8 mg/dL — ABNORMAL HIGH (ref 2.5–4.6)
Potassium: 4.7 mmol/L (ref 3.5–5.1)
Potassium: 5.3 mmol/L — ABNORMAL HIGH (ref 3.5–5.1)
Potassium: 5.2 mmol/L — ABNORMAL HIGH (ref 3.5–5.1)
Sodium: 135 mmol/L (ref 135–145)
Sodium: 134 mmol/L — ABNORMAL LOW (ref 135–145)
Sodium: 136 mmol/L (ref 135–145)

## 2023-04-04 LAB — CULTURE, BLOOD (ROUTINE X 2)
Special Requests: ADEQUATE
Special Requests: ADEQUATE

## 2023-04-04 LAB — CBC
HCT: 31.2 % — ABNORMAL LOW (ref 39.0–52.0)
HCT: 30.8 % — ABNORMAL LOW (ref 39.0–52.0)
Hemoglobin: 9.7 g/dL — ABNORMAL LOW (ref 13.0–17.0)
Hemoglobin: 9.5 g/dL — ABNORMAL LOW (ref 13.0–17.0)
MCH: 27.6 pg (ref 26.0–34.0)
MCH: 27.2 pg (ref 26.0–34.0)
MCHC: 31.1 g/dL (ref 30.0–36.0)
MCHC: 30.8 g/dL (ref 30.0–36.0)
MCV: 88.9 fL (ref 80.0–100.0)
MCV: 88.3 fL (ref 80.0–100.0)
Platelets: 109 10*3/uL — ABNORMAL LOW (ref 150–400)
Platelets: 128 10*3/uL — ABNORMAL LOW (ref 150–400)
RBC: 3.51 MIL/uL — ABNORMAL LOW (ref 4.22–5.81)
RBC: 3.49 MIL/uL — ABNORMAL LOW (ref 4.22–5.81)
RDW: 17.3 % — ABNORMAL HIGH (ref 11.5–15.5)
RDW: 17.4 % — ABNORMAL HIGH (ref 11.5–15.5)
WBC: 6.6 10*3/uL (ref 4.0–10.5)
WBC: 6.4 10*3/uL (ref 4.0–10.5)
nRBC: 0 % (ref 0.0–0.2)
nRBC: 0 % (ref 0.0–0.2)

## 2023-04-04 LAB — CATH TIP CULTURE

## 2023-04-04 LAB — MAGNESIUM: Magnesium: 2.6 mg/dL — ABNORMAL HIGH (ref 1.7–2.4)

## 2023-04-04 MED ORDER — POLYETHYLENE GLYCOL 3350 17 G PO PACK
17.0000 g | PACK | Freq: Every day | ORAL | Status: DC
  Filled 2023-04-04: qty 1

## 2023-04-04 MED ORDER — ALTEPLASE 2 MG IJ SOLR: 2.0000 mg | Freq: Once | INTRAMUSCULAR | Status: DC | PRN

## 2023-04-04 MED ORDER — HEPARIN SODIUM (PORCINE) 1000 UNIT/ML DIALYSIS: 20.0000 [IU]/kg | INTRAMUSCULAR | Status: DC | PRN

## 2023-04-04 MED ORDER — HEPARIN SODIUM (PORCINE) 1000 UNIT/ML DIALYSIS
1000.0000 [IU] | INTRAMUSCULAR | Status: DC | PRN
  Administered 2023-04-05: 1000 [IU]

## 2023-04-04 MED ORDER — ATORVASTATIN CALCIUM 40 MG PO TABS
40.0000 mg | ORAL_TABLET | Freq: Every day | ORAL | Status: DC
  Administered 2023-04-04 – 2023-04-06 (×3): 40 mg via ORAL
  Filled 2023-04-04 (×3): qty 1

## 2023-04-04 MED ORDER — DOCUSATE SODIUM 100 MG PO CAPS
100.0000 mg | ORAL_CAPSULE | Freq: Two times a day (BID) | ORAL | Status: DC
  Filled 2023-04-04 (×3): qty 1

## 2023-04-04 MED ORDER — FERRIC CITRATE 1 GM 210 MG(FE) PO TABS
210.0000 mg | ORAL_TABLET | Freq: Three times a day (TID) | ORAL | Status: DC
  Administered 2023-04-04 – 2023-04-06 (×5): 210 mg via ORAL
  Filled 2023-04-04 (×7): qty 1

## 2023-04-04 MED ORDER — ALBUMIN HUMAN 25 % IV SOLN: 25.0000 g | Freq: Once | INTRAVENOUS | Status: DC

## 2023-04-04 MED ORDER — LIDOCAINE-PRILOCAINE 2.5-2.5 % EX CREA: 1.0000 | TOPICAL_CREAM | CUTANEOUS | Status: DC | PRN

## 2023-04-04 MED ORDER — LIDOCAINE HCL (PF) 1 % IJ SOLN
5.0000 mL | INTRAMUSCULAR | Status: DC | PRN
Start: 2023-04-04 — End: 2023-04-06

## 2023-04-04 MED ORDER — PENTAFLUOROPROP-TETRAFLUOROETH EX AERO: 1.0000 | INHALATION_SPRAY | CUTANEOUS | Status: DC | PRN

## 2023-04-04 MED ORDER — ANTICOAGULANT SODIUM CITRATE 4% (200MG/5ML) IV SOLN
5.0000 mL | Status: DC | PRN
Start: 2023-04-04 — End: 2023-04-06

## 2023-04-04 NOTE — Plan of Care (Signed)
  Problem: Clinical Measurements: Goal: Will remain free from infection Outcome: Progressing   Problem: Nutrition: Goal: Adequate nutrition will be maintained Outcome: Progressing   Problem: Skin Integrity: Goal: Risk for impaired skin integrity will decrease Outcome: Progressing   Problem: Education: Goal: Knowledge of General Education information will improve Description: Including pain rating scale, medication(s)/side effects and non-pharmacologic comfort measures Outcome: Not Progressing

## 2023-04-04 NOTE — Progress Notes (Signed)
NAME:  [name], MRN:  [id number], DOB:  01/20/1976, LOS: 3 ADMISSION DATE:  04/01/2023, CONSULTATION DATE:  04/03/2023 REFERRING MD:  Dr. Rush Landmark, CHIEF COMPLAINT:  Chills, N/V  History of Present Illness:  47 y.o. male with a known history of end-stage renal disease on renal replacement therapy. He does have a prior history of failed renal transplant. Chronically patient is on systemic anticoagulation with Eliquis for atrial fibrillation. He has a known history of chronic hypotension for which she takes midodrine. Patient presents with a cough as well as nausea, vomiting, and diarrhea for 1 day. He has been able to keep down some fluids. He denies any frank abdominal pain. He has had no subjective fever. He denies any headache or near syncope. In the emergency department the patient had questionable drainage from left femoral dialysis access site which was placed by interventional radiology in August 2024. Notably patient was hospitalized in August 2024 and treated for right foot osteomyelitis. Patient was also treated for a Proteus bacteremia. In the emergency department the patient had a low-grade fever. His blood pressure was fluctuating intermittently. He was noted to have hyperkalemia with metabolic acidosis that prompted nephrology consultation. Patient was initiated on vancomycin, cefepime, and Flagyl in the ED and critical care was consulted. At the time of my evaluation patient denied any difficulty breathing or wheezing. He denied any acute vision changes.   Pertinent  Medical History   Past Medical History:  Diagnosis Date   Acute respiratory failure with hypoxia (HCC) 11/30/2018   Anemia    ESRD   ESRD on hemodialysis (HCC) 06/07/2011   TTS [facility]. Started HD in 2006, got transplant in 2014 lasted until Dec 2019 then went back on HD.  Has L thigh AVG as of Jun 2020.  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    History of blood transfusion    Hypertension     03/19/22- has not had high blood pressure in 5 years.   Morbid obesity (HCC)    Paroxysmal atrial fibrillation (HCC)    Pre-diabetes    no meds   Sepsis (HCC) 11/30/2018   Sleep apnea    lost weight, does not use CPAP     Significant Hospital Events: Including procedures, antibiotic start and stop dates in addition to other pertinent events   Cefepime, Flagyl, and Vanc. started 10/23 Flagyl stopped 10/23 Vancomycin stopped 10/24 10/24 HD line exchange 10/25 echo okay  Interim History / Subjective:  Off pressors, denies complaints For HD today  Objective   Blood pressure 128/84, pulse 62, temperature 98.3 F (36.8 C), temperature source Oral, resp. rate 18, height 6\' 2"  (1.88 m), weight 132.6 kg, SpO2 100%.        Intake/Output Summary (Last 24 hours) at 04/04/2023 0806 Last data filed at 04/04/2023 0000 Gross per 24 hour  Intake 691.04 ml  Output 452 ml  Net 239.04 ml   Filed Weights   04/01/23 1613 04/03/23 0353 04/04/23 0447  Weight: (!) 141.5 kg (!) 137.9 kg 132.6 kg    Physical Exam: No distress L femoral HD line looks okay No edema Aox3 Moves to command Heart/lung exam looks okay  BMP reviewed CBC stable anemia and thrombocytopenia  Resolved Hospital Problem list   N/A  Assessment & Plan:  #Septic shock secondary to Morganella CLABSI- echo benign #ESRD #Hyperkalemia - Off pressors and CRRT - For iHD today - HD catheter changed 10/25, would do t+2 weeks IV abx  #Paroxysmal  atrial fibrillation - Restarted on PTA eliquis   #Thrombocytopenia - Stable   #Chronic anemia - Stable   #OSA -Refuses CPAP, desats with sleep as is normal, would stop pulse ox  Okay to transfer to floor after HD, remaining issues include abx plan pending sensitivities from blood culture Appreciate TRH taking over starting 04/05/23  Myrla Halsted MD PCCM

## 2023-04-04 NOTE — Progress Notes (Signed)
Patient has continued to refuse bath. This RN and nurse tech have asked multiple times throughout the shift, but he continues to refuse, stating that "he has had a bath the past two days even though he has not left the bed". Patient refused Bactroban this AM stating that "there is nothing wrong with his nose". Patient also refusing bowel regimen stating that "he doesn't need anything when he just started eating again two days ago". This RN provided education regarding the important of bowel regimen, Bactroban, and bath/CHG.

## 2023-04-04 NOTE — Progress Notes (Signed)
[facility] Progress Note    Assessment/ Plan:   Septic shock presumably catheter related infection: abx per primary service. Hasn't taken outpatient midodrine in a month. Off pressors now,back on midodrine 15mg  tid. Bcx 10/23 positive for morganella morganii. On cefepime -tdc tip culture 10/24 positive for morganella morganii   Hyperkalemia: improved renal replacement therapy. K 4.7. managing with HD. Renal diet recommended   ESRD -Outpatient orders: [facility].  TTS.  4.5 hours.  EDW 137 kg.  TDC.  Flow rates: 500/500.  2K/2 calcium.  Heparin: 5000 units bolus, 2500 units intermittent.  Meds: Hectorol 2 mcg with treatment, Parsabiv 7.5 mg with treatment. Not on ESA or Fe currently -[facility] exchanges 10/24. CRRT started 10/24. Stopped 10/25. Planning on bedside HD today   Anemia of ESRD: Hemoglobin 9.7. Avoiding iron. Transfuse PRN for Hgb <7. Checking Fe panel. Will dose ESA for 10/25   CKD-metabolic Bone Disease: PO4 acceptable, resume home binders  Thrombocytopenia: per primary service, likely related to sepsis?  Access: -no good access points therefore cannot do line holiday, s/p [facility] exchange 10/24 with IR  Discussed with ICU RN.  Subjective:   Patient seen and examined bedside in ICU. CRRT off yesterday. Net pos 0.5L. Off pressors now. No complaints.   Objective:   BP 128/84   Pulse 68   Temp 98.3 F (36.8 C) (Oral)   Resp 12   Ht 6\' 2"  (1.88 m)   Wt 132.6 kg   SpO2 94%   BMI 37.53 kg/m   Intake/Output Summary (Last 24 hours) at 04/04/2023 4696 Last data filed at 04/04/2023 0800 Gross per 24 hour  Intake 918.46 ml  Output 351 ml  Net 567.46 ml   Weight change: -5.3 kg  Physical Exam: Gen: NAD CVS: RRR Resp: CTA B/L Abd: obese, soft Ext: no sig edema b/l Les Neuro: awake, alert Dialysis access: left fem [facility]  Imaging: ECHOCARDIOGRAM COMPLETE  Result Date: 04/03/2023    ECHOCARDIOGRAM REPORT   Patient Name:   [name] Date of  Exam: 04/03/2023 Medical Rec #:  [id number]         Height:       74.0 in Accession #:    [id number]        Weight:       304.0 lb Date of Birth:  07-25-1975         BSA:          2.597 m Patient Age:    47 years          BP:           101/57 mmHg Patient Gender: M                 HR:           74 bpm. Exam Location:  Inpatient Procedure: 2D Echo, Color Doppler and Cardiac Doppler Indications:    Bacteremia  History:        Patient has prior history of Echocardiogram examinations, most                 recent 12/07/2022. CHF, Arrythmias:Atrial Fibrillation; Risk                 Factors:Hypertension, Diabetes, Dyslipidemia, ESRD and Sleep                 Apnea.  Sonographer:    Irving Burton Senior RDCS Referring Phys: [id number] Lorin Glass  Sonographer Comments: Suboptimal subcostal window. Technically difficult due  to patient body habitus. IMPRESSIONS  1. Left ventricular ejection fraction, by estimation, is 60 to 65%. The left ventricle has normal function. The left ventricle has no regional wall motion abnormalities. Left ventricular diastolic parameters were normal.  2. Right ventricular systolic function is mildly reduced. The right ventricular size is normal. There is normal pulmonary artery systolic pressure. The estimated right ventricular systolic pressure is 22.5 mmHg.  3. The mitral valve is grossly normal. Trivial mitral valve regurgitation. No evidence of mitral stenosis.  4. The aortic valve was not well visualized. Aortic valve regurgitation is not visualized. Aortic valve sclerosis is present, with no evidence of aortic valve stenosis.  5. The inferior vena cava is normal in size with greater than 50% respiratory variability, suggesting right atrial pressure of 3 mmHg. Comparison(s): A prior study was performed on 12/07/2022. No significant change from prior study. Conclusion(s)/Recommendation(s): No evidence of valvular vegetations on this transthoracic echocardiogram. Consider a transesophageal  echocardiogram to exclude infective endocarditis if clinically indicated. FINDINGS  Left Ventricle: Left ventricular ejection fraction, by estimation, is 60 to 65%. The left ventricle has normal function. The left ventricle has no regional wall motion abnormalities. The left ventricular internal cavity size was normal in size. There is  no left ventricular hypertrophy. Left ventricular diastolic parameters were normal. Normal left ventricular filling pressure. Right Ventricle: The right ventricular size is normal. No increase in right ventricular wall thickness. Right ventricular systolic function is mildly reduced. There is normal pulmonary artery systolic pressure. The tricuspid regurgitant velocity is 2.21 m/s, and with an assumed right atrial pressure of 3 mmHg, the estimated right ventricular systolic pressure is 22.5 mmHg. Left Atrium: Left atrial size was normal in size. Right Atrium: Right atrial size was normal in size. Pericardium: There is no evidence of pericardial effusion. Mitral Valve: The mitral valve is grossly normal. Mild mitral annular calcification. Trivial mitral valve regurgitation. No evidence of mitral valve stenosis. Tricuspid Valve: The tricuspid valve is grossly normal. Tricuspid valve regurgitation is mild . No evidence of tricuspid stenosis. Aortic Valve: The aortic valve was not well visualized. Aortic valve regurgitation is not visualized. Aortic valve sclerosis is present, with no evidence of aortic valve stenosis. Pulmonic Valve: The pulmonic valve was not well visualized. Pulmonic valve regurgitation is not visualized. Aorta: The aortic root and ascending aorta are structurally normal, with no evidence of dilitation. Venous: The inferior vena cava is normal in size with greater than 50% respiratory variability, suggesting right atrial pressure of 3 mmHg. IAS/Shunts: The interatrial septum was not well visualized.  LEFT VENTRICLE PLAX 2D LVIDd:         5.10 cm   Diastology LVIDs:          3.30 cm   LV e' medial:    9.17 cm/s LV PW:         0.90 cm   LV E/e' medial:  10.5 LV IVS:        0.90 cm   LV e' lateral:   13.10 cm/s LVOT diam:     2.20 cm   LV E/e' lateral: 7.4 LV SV:         81 LV SV Index:   31 LVOT Area:     3.80 cm  RIGHT VENTRICLE RV S prime:     8.08 cm/s TAPSE (M-mode): 1.6 cm LEFT ATRIUM             Index        RIGHT ATRIUM  Index LA diam:        3.80 cm 1.46 cm/m   RA Area:     14.80 cm LA Vol (A2C):   67.3 ml 25.92 ml/m  RA Volume:   34.80 ml  13.40 ml/m LA Vol (A4C):   78.3 ml 30.15 ml/m LA Biplane Vol: 75.8 ml 29.19 ml/m  AORTIC VALVE LVOT Vmax:   91.40 cm/s LVOT Vmean:  63.500 cm/s LVOT VTI:    0.214 m  AORTA Ao Root diam: 3.50 cm Ao Asc diam:  3.70 cm MITRAL VALVE               TRICUSPID VALVE MV Area (PHT): 3.33 cm    TR Peak grad:   19.5 mmHg MV Decel Time: 228 msec    TR Vmax:        221.00 cm/s MV E velocity: 96.70 cm/s MV A velocity: 64.90 cm/s  SHUNTS MV E/A ratio:  1.49        Systemic VTI:  0.21 m                            Systemic Diam: 2.20 cm Sunit Tolia Electronically signed by Tessa Lerner Signature Date/Time: 04/03/2023/12:41:59 PM    Final     Labs: BMET Recent Labs  Lab 04/01/23 2302 04/02/23 0111 04/02/23 0119 04/02/23 0203 04/02/23 1191 04/02/23 1722 04/03/23 0350 04/03/23 1614 04/04/23 0436  NA 133*   < > 133* 134* 133* 133* 132* 132* 135  K 6.0*   < > 5.8* 5.5* 5.2* 5.7* 4.5 4.7 4.7  CL 96*  --  98  --  96* 97* 97* 99 98  CO2 19*  --  13*  --  20* 25 22 23 23   GLUCOSE 114*  --  131*  --  111* 108* 94 112* 89  BUN 89*  --  93*  --  93* 78* 65* 57* 63*  CREATININE 17.06*  --  17.29*  --  17.59* 14.45* 11.61* 10.13* 11.28*  CALCIUM 8.8*  --  9.2  --  8.5* 8.3* 8.2* 8.1* 8.4*  PHOS  --   --  5.8*  --  4.1 4.7* 4.0 5.2* 6.2*   < > = values in this interval not displayed.   CBC Recent Labs  Lab 04/01/23 1633 04/02/23 0111 04/02/23 0119 04/02/23 0203 04/02/23 0418 04/03/23 0350 04/04/23 0436  WBC 5.2  --   2.4*  --  5.0 6.2 6.6  NEUTROABS 4.1  --   --   --   --   --   --   HGB 10.4*   < > 11.2* 10.5* 10.3* 9.2* 9.7*  HCT 32.8*   < > 35.9* 31.0* 31.8* 29.4* 31.2*  MCV 86.8  --  88.2  --  86.2 86.7 88.9  PLT 115*  --  127*  119*  --  109* 107* 109*   < > = values in this interval not displayed.    Medications:     apixaban  5 mg Oral BID   Chlorhexidine Gluconate Cloth  6 each Topical Daily   darbepoetin (ARANESP) injection - DIALYSIS  60 mcg Subcutaneous Q Sat-1800   feeding supplement (NEPRO CARB STEADY)  237 mL Oral BID BM   midodrine  15 mg Oral TID WC   multivitamin  1 tablet Oral QHS   mupirocin ointment  1 Application Nasal BID      Anthony Sar, MD [facility]  04/04/2023, 9:37 AM

## 2023-04-05 DIAGNOSIS — A419 Sepsis, unspecified organism: Secondary | ICD-10-CM | POA: Diagnosis not present

## 2023-04-05 DIAGNOSIS — R6521 Severe sepsis with septic shock: Secondary | ICD-10-CM | POA: Diagnosis not present

## 2023-04-05 LAB — RENAL FUNCTION PANEL
Albumin: 2.9 g/dL — ABNORMAL LOW (ref 3.5–5.0)
Albumin: 2.8 g/dL — ABNORMAL LOW (ref 3.5–5.0)
Anion gap: 14 (ref 5–15)
Anion gap: 11 (ref 5–15)
BUN: 47 mg/dL — ABNORMAL HIGH (ref 6–20)
BUN: 65 mg/dL — ABNORMAL HIGH (ref 6–20)
CO2: 23 mmol/L (ref 22–32)
CO2: 23 mmol/L (ref 22–32)
Calcium: 8.2 mg/dL — ABNORMAL LOW (ref 8.9–10.3)
Calcium: 8 mg/dL — ABNORMAL LOW (ref 8.9–10.3)
Chloride: 97 mmol/L — ABNORMAL LOW (ref 98–111)
Chloride: 97 mmol/L — ABNORMAL LOW (ref 98–111)
Creatinine, Ser: 8.82 mg/dL — ABNORMAL HIGH (ref 0.61–1.24)
Creatinine, Ser: 11.55 mg/dL — ABNORMAL HIGH (ref 0.61–1.24)
GFR, Estimated: 7 mL/min — ABNORMAL LOW (ref >60)
GFR, Estimated: 5 mL/min — ABNORMAL LOW (ref >60)
Glucose, Bld: 76 mg/dL (ref 70–99)
Glucose, Bld: 86 mg/dL (ref 70–99)
Phosphorus: 4.2 mg/dL (ref 2.5–4.6)
Phosphorus: 5.6 mg/dL — ABNORMAL HIGH (ref 2.5–4.6)
Potassium: 4.1 mmol/L (ref 3.5–5.1)
Potassium: 5 mmol/L (ref 3.5–5.1)
Sodium: 134 mmol/L — ABNORMAL LOW (ref 135–145)
Sodium: 131 mmol/L — ABNORMAL LOW (ref 135–145)

## 2023-04-05 LAB — CBC
HCT: 29.5 % — ABNORMAL LOW (ref 39.0–52.0)
Hemoglobin: 9.3 g/dL — ABNORMAL LOW (ref 13.0–17.0)
MCH: 27.1 pg (ref 26.0–34.0)
MCHC: 31.5 g/dL (ref 30.0–36.0)
MCV: 86 fL (ref 80.0–100.0)
Platelets: 129 10*3/uL — ABNORMAL LOW (ref 150–400)
RBC: 3.43 MIL/uL — ABNORMAL LOW (ref 4.22–5.81)
RDW: 17.2 % — ABNORMAL HIGH (ref 11.5–15.5)
WBC: 5.9 10*3/uL (ref 4.0–10.5)
nRBC: 0 % (ref 0.0–0.2)

## 2023-04-05 LAB — MAGNESIUM: Magnesium: 2.3 mg/dL (ref 1.7–2.4)

## 2023-04-05 NOTE — Progress Notes (Addendum)
HOSPITALIST ROUNDING NOTE [name] [place] ZOX:[id number]  DOB: 1976/01/17  DOA: 04/01/2023  PCP: [name], MD  04/05/2023,7:17 AM   LOS: 4 days      Code Status: Full code From: Home  current Dispo: Unclear     47 year old black male DM TY 2 super morbid obesity previously Permanent atrial fibrillation on Eliquis HFpEF ESRD HD 2008 DDKT transplant 2014 with limited accesses follows with [facility] vascular Dr. Davis--currently dialyzing through left femoral HD cath long-term plan is hero graft? Right foot ulcer status post R great toe amputation 12/12/2022 daily Last hospitalization 8/20 through 02/05/2023 with gram-negative septic shock was on pressors grew Proteus  Came back to ED 10/23 chills malaise purulent discharge left TDC with missed dialysis bicarb 17 WBC 5. Drainage around left femoral TDC bolused fluids broad-spectrum antibiotics given Off pressors since 10/26 HD line was exchanged 10/24, 10/25 echo was performed which showed no veg   Plan  Septic shock 2/2 Morganella CLABSI cath tip growing morganella 10/24, BC neg--empiric coverage ~  Poor access limits line holiday--d/w Dr Luciana Axe 10/27--rec's 3 weeks Post Hd Ceftazidime and then TOC with BC 3-5 days after d/c Abx---hopefully then can place Hero access Rpt labs am  ESRD TTS [facility], EDW 137 Kg [has lost weight since July intentionally] hyperk Defer to renal--cont midodrine 15 tid.  Hold albumin. Phos is ok, Calcium a little low [facility] will be placing hero line--see above---will alert Nephro re the same Hemoglobin good, cannot give IV iron--give po Auryxia 210 tid--ESA per renal  Permanent A-fib on Eliquis CHA2DS2-VASc >4-cont Apixaban 5 bid Hypotension limits rt control  Thrombocytopenia critical illness Right foot ulcers with prior ray amputation 12/2022 Wound looks very clean   DVT prophylaxis: elquis  Status is: Inpatient Remains inpatient appropriate because:   Needs planning      Subjective: A  little dejected No pain No fever Has [facility] phone call this Wednesday to discuss HeRO  Objective + exam Vitals:   04/05/23 0500 04/05/23 0530 04/05/23 0600 04/05/23 0630  BP:      Pulse: 70 70 71 61  Resp: 19 18 18  (!) 0  Temp:      TempSrc:      SpO2: 99% 94% 91% 99%  Weight: 132.6 kg     Height:       Filed Weights   04/03/23 0353 04/04/23 0447 04/05/23 0500  Weight: (!) 137.9 kg 132.6 kg 132.6 kg    Examination:  Awak coherent nad no focal deficit Abd obese nt dn Line in L fem looks clean, no puc Foot examined--10 cm scar--well healed  Data Reviewed: reviewed   CBC    Component Value Date/Time   WBC 5.9 04/05/2023 0331   RBC 3.43 (L) 04/05/2023 0331   HGB 9.3 (L) 04/05/2023 0331   HCT 29.5 (L) 04/05/2023 0331   PLT 129 (L) 04/05/2023 0331   MCV 86.0 04/05/2023 0331   MCH 27.1 04/05/2023 0331   MCHC 31.5 04/05/2023 0331   RDW 17.2 (H) 04/05/2023 0331   LYMPHSABS 0.4 (L) 04/01/2023 1633   MONOABS 0.6 04/01/2023 1633   EOSABS 0.0 04/01/2023 1633   BASOSABS 0.0 04/01/2023 1633      Latest Ref Rng & Units 04/05/2023   12:30 AM 04/04/2023    8:31 PM 04/04/2023    3:35 PM  CMP  Glucose 70 - 99 mg/dL 76  95  80   BUN 6 - 20 mg/dL 47  66  65  Creatinine 0.61 - 1.24 mg/dL 8.84  16.60  63.01   Sodium 135 - 145 mmol/L 134  136  134   Potassium 3.5 - 5.1 mmol/L 4.1  5.2  5.3   Chloride 98 - 111 mmol/L 97  99  100   CO2 22 - 32 mmol/L 23  23  23    Calcium 8.9 - 10.3 mg/dL 8.2  7.9  8.0      Scheduled Meds:  apixaban  5 mg Oral BID   atorvastatin  40 mg Oral Daily   Chlorhexidine Gluconate Cloth  6 each Topical Daily   darbepoetin (ARANESP) injection - DIALYSIS  60 mcg Subcutaneous Q Sat-1800   docusate sodium  100 mg Oral BID   feeding supplement (NEPRO CARB STEADY)  237 mL Oral BID BM   ferric citrate  210 mg Oral TID WC   midodrine  15 mg Oral TID WC   multivitamin  1 tablet Oral QHS   mupirocin ointment  1 Application Nasal BID   polyethylene glycol   17 g Oral Daily   Continuous Infusions:  albumin human     anticoagulant sodium citrate     ceFEPime (MAXIPIME) IV Stopped (04/05/23 0149)    Time  54  [name], MD  [facility]

## 2023-04-05 NOTE — Progress Notes (Signed)
[facility] Progress Note    Assessment/ Plan:   Septic shock presumably catheter related infection: abx per primary service. Hasn't taken outpatient midodrine in a month. Off pressors now,back on midodrine 15mg  tid. Catheter exchanged 10/24. Bcx 10/23 positive for morganella morganii. On cefepime -tdc tip culture 10/24 positive for morganella morganii   Hyperkalemia: resolved with renal replacement therapy. Renal diet recommended   ESRD -Outpatient orders: [facility].  TTS.  4.5 hours.  EDW 137 kg.  TDC.  Flow rates: 500/500.  2K/2 calcium.  Heparin: 5000 units bolus, 2500 units intermittent.  Meds: Hectorol 2 mcg with treatment, Parsabiv 7.5 mg with treatment. Not on ESA or Fe currently -[facility] exchanges 10/24. S/p CRRT 10/24-25. Tolerated bedside HD on 10/26. Plan for HD on TTS sched, next 10/29   Anemia of ESRD: Hemoglobin 9.3. Avoiding iron given infection (iron deplete on panel). Transfuse PRN for Hgb <7. dosed ESA for 10/25   CKD-metabolic Bone Disease: PO4 acceptable, resume home binders  Thrombocytopenia: per primary service, likely related to sepsis?  Access: -no good access points therefore cannot do line holiday per IR, s/p [facility] exchange 10/24 with IR  Discussed with ICU RN.  Subjective:   Patient seen and examined bedside in ICU. Tolerated HD yesterday with net UF 2L, remains off pressors. No complaints this AM.   Objective:   BP (!) 86/48   Pulse 61   Temp 97.6 F (36.4 C) (Axillary)   Resp (!) 0   Ht 6\' 2"  (1.88 m)   Wt 132.6 kg   SpO2 99%   BMI 37.53 kg/m   Intake/Output Summary (Last 24 hours) at 04/05/2023 4696 Last data filed at 04/05/2023 0200 Gross per 24 hour  Intake 100.19 ml  Output 2000 ml  Net -1899.81 ml   Weight change: 0 kg  Physical Exam: Gen: NAD CVS: RRR Resp: CTA B/L Abd: obese, soft Ext: no sig edema b/l Les Neuro: awake, alert Dialysis access: left fem [facility]  Imaging: ECHOCARDIOGRAM COMPLETE  Result Date:  04/03/2023    ECHOCARDIOGRAM REPORT   Patient Name:   [name] Date of Exam: 04/03/2023 Medical Rec #:  [id number]         Height:       74.0 in Accession #:    [id number]        Weight:       304.0 lb Date of Birth:  01/14/1976         BSA:          2.597 m Patient Age:    47 years          BP:           101/57 mmHg Patient Gender: M                 HR:           74 bpm. Exam Location:  Inpatient Procedure: 2D Echo, Color Doppler and Cardiac Doppler Indications:    Bacteremia  History:        Patient has prior history of Echocardiogram examinations, most                 recent 12/07/2022. CHF, Arrythmias:Atrial Fibrillation; Risk                 Factors:Hypertension, Diabetes, Dyslipidemia, ESRD and Sleep                 Apnea.  Sonographer:    Irving Burton Senior RDCS Referring  Phys: [id number] Lorin Glass  Sonographer Comments: Suboptimal subcostal window. Technically difficult due to patient body habitus. IMPRESSIONS  1. Left ventricular ejection fraction, by estimation, is 60 to 65%. The left ventricle has normal function. The left ventricle has no regional wall motion abnormalities. Left ventricular diastolic parameters were normal.  2. Right ventricular systolic function is mildly reduced. The right ventricular size is normal. There is normal pulmonary artery systolic pressure. The estimated right ventricular systolic pressure is 22.5 mmHg.  3. The mitral valve is grossly normal. Trivial mitral valve regurgitation. No evidence of mitral stenosis.  4. The aortic valve was not well visualized. Aortic valve regurgitation is not visualized. Aortic valve sclerosis is present, with no evidence of aortic valve stenosis.  5. The inferior vena cava is normal in size with greater than 50% respiratory variability, suggesting right atrial pressure of 3 mmHg. Comparison(s): A prior study was performed on 12/07/2022. No significant change from prior study. Conclusion(s)/Recommendation(s): No evidence of valvular  vegetations on this transthoracic echocardiogram. Consider a transesophageal echocardiogram to exclude infective endocarditis if clinically indicated. FINDINGS  Left Ventricle: Left ventricular ejection fraction, by estimation, is 60 to 65%. The left ventricle has normal function. The left ventricle has no regional wall motion abnormalities. The left ventricular internal cavity size was normal in size. There is  no left ventricular hypertrophy. Left ventricular diastolic parameters were normal. Normal left ventricular filling pressure. Right Ventricle: The right ventricular size is normal. No increase in right ventricular wall thickness. Right ventricular systolic function is mildly reduced. There is normal pulmonary artery systolic pressure. The tricuspid regurgitant velocity is 2.21 m/s, and with an assumed right atrial pressure of 3 mmHg, the estimated right ventricular systolic pressure is 22.5 mmHg. Left Atrium: Left atrial size was normal in size. Right Atrium: Right atrial size was normal in size. Pericardium: There is no evidence of pericardial effusion. Mitral Valve: The mitral valve is grossly normal. Mild mitral annular calcification. Trivial mitral valve regurgitation. No evidence of mitral valve stenosis. Tricuspid Valve: The tricuspid valve is grossly normal. Tricuspid valve regurgitation is mild . No evidence of tricuspid stenosis. Aortic Valve: The aortic valve was not well visualized. Aortic valve regurgitation is not visualized. Aortic valve sclerosis is present, with no evidence of aortic valve stenosis. Pulmonic Valve: The pulmonic valve was not well visualized. Pulmonic valve regurgitation is not visualized. Aorta: The aortic root and ascending aorta are structurally normal, with no evidence of dilitation. Venous: The inferior vena cava is normal in size with greater than 50% respiratory variability, suggesting right atrial pressure of 3 mmHg. IAS/Shunts: The interatrial septum was not well  visualized.  LEFT VENTRICLE PLAX 2D LVIDd:         5.10 cm   Diastology LVIDs:         3.30 cm   LV e' medial:    9.17 cm/s LV PW:         0.90 cm   LV E/e' medial:  10.5 LV IVS:        0.90 cm   LV e' lateral:   13.10 cm/s LVOT diam:     2.20 cm   LV E/e' lateral: 7.4 LV SV:         81 LV SV Index:   31 LVOT Area:     3.80 cm  RIGHT VENTRICLE RV S prime:     8.08 cm/s TAPSE (M-mode): 1.6 cm LEFT ATRIUM  Index        RIGHT ATRIUM           Index LA diam:        3.80 cm 1.46 cm/m   RA Area:     14.80 cm LA Vol (A2C):   67.3 ml 25.92 ml/m  RA Volume:   34.80 ml  13.40 ml/m LA Vol (A4C):   78.3 ml 30.15 ml/m LA Biplane Vol: 75.8 ml 29.19 ml/m  AORTIC VALVE LVOT Vmax:   91.40 cm/s LVOT Vmean:  63.500 cm/s LVOT VTI:    0.214 m  AORTA Ao Root diam: 3.50 cm Ao Asc diam:  3.70 cm MITRAL VALVE               TRICUSPID VALVE MV Area (PHT): 3.33 cm    TR Peak grad:   19.5 mmHg MV Decel Time: 228 msec    TR Vmax:        221.00 cm/s MV E velocity: 96.70 cm/s MV A velocity: 64.90 cm/s  SHUNTS MV E/A ratio:  1.49        Systemic VTI:  0.21 m                            Systemic Diam: 2.20 cm Sunit Tolia Electronically signed by Tessa Lerner Signature Date/Time: 04/03/2023/12:41:59 PM    Final     Labs: BMET Recent Labs  Lab 04/02/23 1722 04/03/23 0350 04/03/23 1614 04/04/23 0436 04/04/23 1535 04/04/23 2031 04/05/23 0030  NA 133* 132* 132* 135 134* 136 134*  K 5.7* 4.5 4.7 4.7 5.3* 5.2* 4.1  CL 97* 97* 99 98 100 99 97*  CO2 25 22 23 23 23 23 23   GLUCOSE 108* 94 112* 89 80 95 76  BUN 78* 65* 57* 63* 65* 66* 47*  CREATININE 14.45* 11.61* 10.13* 11.28* 12.17* 12.30* 8.82*  CALCIUM 8.3* 8.2* 8.1* 8.4* 8.0* 7.9* 8.2*  PHOS 4.7* 4.0 5.2* 6.2* 6.1* 5.8* 4.2   CBC Recent Labs  Lab 04/01/23 1633 04/02/23 0111 04/03/23 0350 04/04/23 0436 04/04/23 2031 04/05/23 0331  WBC 5.2   < > 6.2 6.6 6.4 5.9  NEUTROABS 4.1  --   --   --   --   --   HGB 10.4*   < > 9.2* 9.7* 9.5* 9.3*  HCT 32.8*   < >  29.4* 31.2* 30.8* 29.5*  MCV 86.8   < > 86.7 88.9 88.3 86.0  PLT 115*   < > 107* 109* 128* 129*   < > = values in this interval not displayed.    Medications:     apixaban  5 mg Oral BID   atorvastatin  40 mg Oral Daily   Chlorhexidine Gluconate Cloth  6 each Topical Daily   darbepoetin (ARANESP) injection - DIALYSIS  60 mcg Subcutaneous Q Sat-1800   docusate sodium  100 mg Oral BID   feeding supplement (NEPRO CARB STEADY)  237 mL Oral BID BM   ferric citrate  210 mg Oral TID WC   midodrine  15 mg Oral TID WC   multivitamin  1 tablet Oral QHS   mupirocin ointment  1 Application Nasal BID   polyethylene glycol  17 g Oral Daily      Anthony Sar, MD [facility] 04/05/2023, 8:28 AM

## 2023-04-05 NOTE — Plan of Care (Signed)

## 2023-04-06 DIAGNOSIS — R6521 Severe sepsis with septic shock: Secondary | ICD-10-CM | POA: Diagnosis not present

## 2023-04-06 DIAGNOSIS — A419 Sepsis, unspecified organism: Secondary | ICD-10-CM | POA: Diagnosis not present

## 2023-04-06 LAB — RENAL FUNCTION PANEL
Albumin: 2.8 g/dL — ABNORMAL LOW (ref 3.5–5.0)
Anion gap: 16 — ABNORMAL HIGH (ref 5–15)
BUN: 66 mg/dL — ABNORMAL HIGH (ref 6–20)
CO2: 21 mmol/L — ABNORMAL LOW (ref 22–32)
Calcium: 8.3 mg/dL — ABNORMAL LOW (ref 8.9–10.3)
Chloride: 96 mmol/L — ABNORMAL LOW (ref 98–111)
Creatinine, Ser: 12.42 mg/dL — ABNORMAL HIGH (ref 0.61–1.24)
GFR, Estimated: 5 mL/min — ABNORMAL LOW (ref >60)
Glucose, Bld: 78 mg/dL (ref 70–99)
Phosphorus: 6 mg/dL — ABNORMAL HIGH (ref 2.5–4.6)
Potassium: 4.7 mmol/L (ref 3.5–5.1)
Sodium: 133 mmol/L — ABNORMAL LOW (ref 135–145)

## 2023-04-06 LAB — CBC WITH DIFFERENTIAL/PLATELET
Abs Immature Granulocytes: 0.3 10*3/uL — ABNORMAL HIGH (ref 0.00–0.07)
Basophils Absolute: 0 10*3/uL (ref 0.0–0.1)
Basophils Relative: 0 %
Eosinophils Absolute: 0.1 10*3/uL (ref 0.0–0.5)
Eosinophils Relative: 2 %
HCT: 31.7 % — ABNORMAL LOW (ref 39.0–52.0)
Hemoglobin: 9.8 g/dL — ABNORMAL LOW (ref 13.0–17.0)
Lymphocytes Relative: 18 %
Lymphs Abs: 1.3 10*3/uL (ref 0.7–4.0)
MCH: 26.9 pg (ref 26.0–34.0)
MCHC: 30.9 g/dL (ref 30.0–36.0)
MCV: 87.1 fL (ref 80.0–100.0)
Metamyelocytes Relative: 1 %
Monocytes Absolute: 0.5 10*3/uL (ref 0.1–1.0)
Monocytes Relative: 7 %
Myelocytes: 3 %
Neutro Abs: 4.8 10*3/uL (ref 1.7–7.7)
Neutrophils Relative %: 69 %
Platelets: 175 10*3/uL (ref 150–400)
RBC: 3.64 MIL/uL — ABNORMAL LOW (ref 4.22–5.81)
RDW: 17.2 % — ABNORMAL HIGH (ref 11.5–15.5)
WBC: 7 10*3/uL (ref 4.0–10.5)
nRBC: 0 % (ref 0.0–0.2)
nRBC: 0 /100{WBCs}

## 2023-04-06 LAB — MAGNESIUM: Magnesium: 2.5 mg/dL — ABNORMAL HIGH (ref 1.7–2.4)

## 2023-04-06 LAB — CULTURE, BLOOD (SINGLE): Culture: NO GROWTH

## 2023-04-06 LAB — PATHOLOGIST SMEAR REVIEW

## 2023-04-06 MED ORDER — DEXTROSE 5 % IV SOLN: 2.0000 g | Freq: Three times a day (TID) | INTRAVENOUS | 0 refills | Status: DC

## 2023-04-06 MED ORDER — DEXTROSE 5 % IV SOLN: 2.0000 g | INTRAVENOUS | 0 refills | Status: DC

## 2023-04-06 NOTE — TOC Transition Note (Signed)
Transition of Care [facility]) - CM/SW Discharge Note   Patient Details  Name: [name] MRN: [id number] Date of Birth: 11-10-1975  Transition of [facility]) CM/SW Contact:  Tom-Johnson, Hershal Coria, RN Phone Number: 04/06/2023, 3:00 PM   Clinical Narrative:     Patient is scheduled for discharge today.  Readmission Risk Assessment done. Outpatient f/u, hospital f/u and discharge instructions on AVS. Patient to receive prescribed IV abx at outpatient dialysis. No TOC needs or recommendations noted. Niece, York Ram or friend JT to transport at discharge.  No further TOC needs noted.      Patient Goals and CMS Choice      Discharge Placement                       Discharge Plan and Services Additional resources added to the After Visit Summary for                                       Social Determinants of Health (SDOH) Interventions SDOH Screenings   Food Insecurity: No Food Insecurity (04/02/2023)  Housing: Patient Declined (04/02/2023)  Transportation Needs: No Transportation Needs (04/02/2023)  Recent Concern: Transportation Needs - Unmet Transportation Needs (02/02/2023)  Utilities: Not At Risk (04/02/2023)  Alcohol Screen: Low Risk  (12/29/2022)  Depression (PHQ2-9): Low Risk  (02/18/2023)  Financial Resource Strain: Low Risk  (12/29/2022)  Physical Activity: Insufficiently Active (12/29/2022)  Social Connections: Socially Isolated (12/29/2022)  Stress: No Stress Concern Present (12/29/2022)  Tobacco Use: Medium Risk (04/01/2023)  Health Literacy: Adequate Health Literacy (12/29/2022)     Readmission Risk Interventions    04/06/2023    2:59 PM 02/05/2023    2:24 PM 12/16/2022    2:42 PM  Readmission Risk Prevention Plan  Transportation Screening Complete Complete Complete  PCP or Specialist Appt within 3-5 Days   Complete  HRI or Home Care Consult   Complete  Social Work Consult for Recovery Care Planning/Counseling   Complete   Palliative Care Screening   Not Applicable  Medication Review Oceanographer) Referral to Pharmacy Referral to Pharmacy Complete  PCP or Specialist appointment within 3-5 days of discharge Complete Complete   HRI or Home Care Consult Complete Complete   SW Recovery Care/Counseling Consult Complete Complete   Palliative Care Screening Not Applicable Not Applicable   Skilled Nursing Facility Not Applicable Not Applicable

## 2023-04-06 NOTE — Progress Notes (Signed)
[facility] Progress Note   Subjective:    Seen and examined patient at bedside. He denies any acute complaints/issues. He reports being discharged today. Plan for him to resume HD tomorrow on ABXs. Spoke with outpatient HD Charge RN who confirmed Ceftazidime is in stock.  Objective Vitals:   04/05/23 1030 04/05/23 1725 04/05/23 1905 04/06/23 0837  BP: (!) 89/69 109/72 113/73 108/80  Pulse: 66 64 (!) 55 67  Resp:  20  16  Temp: 98.5 F (36.9 C) 97.8 F (36.6 C) 98 F (36.7 C)   TempSrc: Oral  Oral   SpO2: 99% 99% 100% 100%  Weight:      Height:       Physical Exam General: Alert; Awake; NAD; on RA Heart: S1 and S2; No MRGs Lungs: Clear anteriorly Abdomen: Soft and non-tender Extremities:No LE edema Dialysis Access: Left fem [facility]   Filed Weights   04/03/23 0353 04/04/23 0447 04/05/23 0500  Weight: (!) 137.9 kg 132.6 kg 132.6 kg    Intake/Output Summary (Last 24 hours) at 04/06/2023 0939 Last data filed at 04/06/2023 0700 Gross per 24 hour  Intake 240 ml  Output --  Net 240 ml    Additional Objective Labs: Basic Metabolic Panel: Recent Labs  Lab 04/05/23 0030 04/05/23 2147 04/06/23 0452  NA 134* 131* 133*  K 4.1 5.0 4.7  CL 97* 97* 96*  CO2 23 23 21*  GLUCOSE 76 86 78  BUN 47* 65* 66*  CREATININE 8.82* 11.55* 12.42*  CALCIUM 8.2* 8.0* 8.3*  PHOS 4.2 5.6* 6.0*   Liver Function Tests: Recent Labs  Lab 04/01/23 1633 04/02/23 0119 04/05/23 0030 04/05/23 2147 04/06/23 0452  AST 17  --   --   --   --   ALT 14  --   --   --   --   ALKPHOS 68  --   --   --   --   BILITOT 0.7  --   --   --   --   PROT 7.2  --   --   --   --   ALBUMIN 3.3*   < > 2.9* 2.8* 2.8*   < > = values in this interval not displayed.   No results for input(s): "LIPASE", "AMYLASE" in the last 168 hours. CBC: Recent Labs  Lab 04/01/23 1633 04/02/23 0111 04/03/23 0350 04/04/23 0436 04/04/23 2031 04/05/23 0331 04/06/23 0452  WBC 5.2   < > 6.2 6.6 6.4 5.9 7.0   NEUTROABS 4.1  --   --   --   --   --  4.8  HGB 10.4*   < > 9.2* 9.7* 9.5* 9.3* 9.8*  HCT 32.8*   < > 29.4* 31.2* 30.8* 29.5* 31.7*  MCV 86.8   < > 86.7 88.9 88.3 86.0 87.1  PLT 115*   < > 107* 109* 128* 129* 175   < > = values in this interval not displayed.   Blood Culture    Component Value Date/Time   SDES CATH TIP 04/02/2023 0930   SPECREQUEST  04/02/2023 0930    NONE Performed at [facility], [address] [place], [place] [address]    CULT MORGANELLA MORGANII (A) 04/02/2023 0930   REPTSTATUS 04/04/2023 FINAL 04/02/2023 0930    Cardiac Enzymes: No results for input(s): "CKTOTAL", "CKMB", "CKMBINDEX", "TROPONINI" in the last 168 hours. CBG: Recent Labs  Lab 04/03/23 0345 04/03/23 0720 04/03/23 1152 04/03/23 1527 04/03/23 1909  GLUCAP 85 91 135*  109* 116*   Iron Studies: No results for input(s): "IRON", "TIBC", "TRANSFERRIN", "FERRITIN" in the last 72 hours. Lab Results  Component Value Date   INR 1.8 (H) 04/02/2023   INR 1.9 (H) 04/01/2023   INR 1.4 (H) 02/02/2023   Studies/Results: No results found.  Medications:  anticoagulant sodium citrate     ceFEPime (MAXIPIME) IV 1 g (04/05/23 2157)    apixaban  5 mg Oral BID   atorvastatin  40 mg Oral Daily   Chlorhexidine Gluconate Cloth  6 each Topical Daily   darbepoetin (ARANESP) injection - DIALYSIS  60 mcg Subcutaneous Q Sat-1800   docusate sodium  100 mg Oral BID   feeding supplement (NEPRO CARB STEADY)  237 mL Oral BID BM   ferric citrate  210 mg Oral TID WC   midodrine  15 mg Oral TID WC   multivitamin  1 tablet Oral QHS   mupirocin ointment  1 Application Nasal BID   polyethylene glycol  17 g Oral Daily    Dialysis Orders: [facility]. TTS. 4.5 hours. EDW 137 kg. TDC. Flow rates: 500/500. 2K/2 calcium. Heparin: 5000 units bolus, 2500 units intermittent. Meds: Hectorol 2 mcg with treatment, Parsabiv 7.5 mg with treatment. Not on ESA or Fe currently   Assessment/Plan: Septic shock  presumably catheter related infection: abx per primary service. Hasn't taken outpatient midodrine in a month. Off pressors now,back on midodrine 15mg  tid. Catheter exchanged 10/24. Bcx 10/23 positive for morganella morganii. On cefepime -tdc tip culture 10/24 positive for morganella morganii -Plan for dc today and will resume HD tomorrow (04/07/23). Discussed with Pharmacy: he will get Ceftazidime 2g IV with HD until 04/28/23.   Hyperkalemia: resolved with renal replacement therapy. Renal diet recommended   ESRD -[facility] exchanges 10/24. S/p CRRT 10/24-25. Tolerated bedside HD on 10/26. Plan for HD on TTS sched, next 10/29 at [facility], see above   Anemia of ESRD: Hemoglobin 9.8. Avoiding iron given infection (iron deplete on panel). Transfuse PRN for Hgb <7. dosed ESA for 10/25   CKD-metabolic Bone Disease: PO4 trending up, resume home binders in outpatient   Thrombocytopenia: per primary service, likely related to sepsis?   Access: -no good access points therefore cannot do line holiday per IR, s/p [facility] exchange 10/24 with IR -need to f/u in outpatient on HERO graft placement at [facility]  Dispo: -Okay for discharge today from renal standpoint -See above for ABX s plan  Salome Holmes, NP [facility] 04/06/2023,9:39 AM  LOS: 5 days

## 2023-04-06 NOTE — Discharge Summary (Signed)
soft  Discharge Instructions    Allergies as of 04/06/2023   No Known Allergies      Medication List     TAKE these medications    apixaban 5 MG Tabs tablet Commonly known as: ELIQUIS Take 5 mg by mouth 2 (two) times daily.   atorvastatin 40 MG tablet Commonly known as: LIPITOR Take 1 tablet (40 mg total) by mouth  daily.   CALCIUM 600 + D PO Take 1 tablet by mouth daily.   Dialyvite/Zinc Tabs Take 1 tablet by mouth daily.   ferric citrate 1 GM 210 MG(Fe) tablet Commonly known as: AURYXIA Take 210 mg by mouth 3 (three) times daily with meals.   midodrine 5 MG tablet Commonly known as: PROAMATINE Take 15 mg by mouth 3 (three) times daily.   Xphozah 30 MG Tabs Generic drug: Tenapanor HCl (CKD) Take 30 mg by mouth in the morning and at bedtime.       No Known Allergies    The results of significant diagnostics from this hospitalization (including imaging, microbiology, ancillary and laboratory) are listed below for reference.    Significant Diagnostic Studies: ECHOCARDIOGRAM COMPLETE  Result Date: 04/03/2023    ECHOCARDIOGRAM REPORT   Patient Name:   [name] Date of Exam: 04/03/2023 Medical Rec #:  [id number]         Height:       74.0 in Accession #:    [id number]        Weight:       304.0 lb Date of Birth:  [birth year]         BSA:          2.597 m Patient Age:    47 years          BP:           101/57 mmHg Patient Gender: M                 HR:           74 bpm. Exam Location:  Inpatient Procedure: 2D Echo, Color Doppler and Cardiac Doppler Indications:    Bacteremia  History:        Patient has prior history of Echocardiogram examinations, most                 recent 12/07/2022. CHF, Arrythmias:Atrial Fibrillation; Risk                 Factors:Hypertension, Diabetes, Dyslipidemia, ESRD and Sleep                 Apnea.  Sonographer:    Irving Burton Senior RDCS Referring Phys: [id number] Lorin Glass  Sonographer Comments: Suboptimal subcostal window. Technically difficult due to patient body habitus. IMPRESSIONS  1. Left ventricular ejection fraction, by estimation, is 60 to 65%. The left ventricle has normal function. The left ventricle has no regional wall motion abnormalities. Left ventricular diastolic parameters were normal.  2. Right ventricular systolic function is mildly reduced. The  right ventricular size is normal. There is normal pulmonary artery systolic pressure. The estimated right ventricular systolic pressure is 22.5 mmHg.  3. The mitral valve is grossly normal. Trivial mitral valve regurgitation. No evidence of mitral stenosis.  4. The aortic valve was not well visualized. Aortic valve regurgitation is not visualized. Aortic valve sclerosis is present, with no evidence of aortic valve stenosis.  5. The inferior vena cava is normal in size with greater than 50% respiratory variability, suggesting right  Physician Discharge Summary  [name] RUE:[id number] DOB: Dec 28, 1975 DOA: 04/01/2023  PCP: Robert Mast, MD  Admit date: 04/01/2023 Discharge date: 04/06/2023  Time spent: 46 minutes  Recommendations for Outpatient Follow-up:  Rec Ceftazidime for several weeks and then a Blood culture 3-5 days after to see if bactermia has cleared--if it does not clear then patient may be able to pursue hero graft placement at [facility] for dialysis access  Discharge Diagnoses:  MAIN problem for hospitalization   Thick shock secondary to Morganella [facility]ABSI [facility] [place] CKD baseline weight 137  Permanent A-fib on Eliquis  Please see below for itemized issues addressed in HOpsital- refer to other progress notes for clarity if needed  Discharge Condition: Improved  Diet recommendation: Renal  Filed Weights   04/03/23 0353 04/04/23 0447 04/05/23 0500  Weight: (!) 137.9 kg 132.6 kg 132.6 kg    History of present illness:  47 year old black male DM TY 2 super morbid obesity previously Permanent atrial fibrillation on Eliquis HFpEF ESRD HD 2008 DDKT transplant 2014 with limited accesses follows with [facility] vascular Dr. Davis--currently dialyzing through left femoral HD cath long-term plan is hero graft? Right foot ulcer status post R great toe amputation 12/12/2022 daily Last hospitalization 8/20 through 02/05/2023 with gram-negative septic shock was on pressors grew Proteus   Came back to ED 10/23 chills malaise purulent discharge left TDC with missed dialysis bicarb 17 WBC 5. Drainage around left femoral TDC bolused fluids broad-spectrum antibiotics given Off pressors since 10/26 HD line was exchanged 10/24, 10/25 echo was performed which showed no veg     Plan   Septic shock 2/2 Morganella CLABSI cath tip growing morganella 10/24, BC neg--empiric coverage ~  Poor access limits line holiday --d/w Dr Luciana Axe infectious disease 10/27 and went over the entire clinical  scenario in addition to the workup inclusive of negative TTE --rec's 3 weeks Post Hd Ceftazidime and then TOC with BC 3-5 days after d/c Abx---hopefully then can place Hero access with his specialist at [facility] who he has an appointment with on 10/30 to discuss the option and the planning   ESRD TTS [facility], EDW 137 Kg [has lost weight since July intentionally] hyperk Defer to renal--cont midodrine 15 tid.  Hold albumin. Phos is ok, Calcium a little low [facility] will be placing hero line--I have the patient to call [facility] And ensure that they are aware of his discharge from the hospital with bacteremia so that they can appropriately schedule his follow-up appointment for the hero graft placement-he is aware he needs to call  hemoglobin good, cannot give IV iron secondary to active infection--give po Auryxia 210 tid--ESA per renal and will need iron studies etc. as an outpatient   Permanent A-fib on Eliquis CHA2DS2-VASc >4-cont Apixaban 5 bid Hypotension limits rt control Patient was predominantly rate controlled during hospital stay without any agent who brought    Thrombocytopenia critical illness Right foot ulcers with prior ray amputation 12/2022 Wound looks very clean on my assessment prior to discharge   Discharge Exam: Vitals:   04/05/23 1905 04/06/23 0837  BP: 113/73 108/80  Pulse: (!) 55 67  Resp:  16  Temp: 98 F (36.7 C)   SpO2: 100% 100%    Subj on day of d/c   Looks well feels fair  General Exam on discharge  EOMI NCAT no focal deficit S1 and s2 no murmur--- seems to be in sinus Catheter in left lower extremity seems like clean abdomen is  soft  Discharge Instructions    Allergies as of 04/06/2023   No Known Allergies      Medication List     TAKE these medications    apixaban 5 MG Tabs tablet Commonly known as: ELIQUIS Take 5 mg by mouth 2 (two) times daily.   atorvastatin 40 MG tablet Commonly known as: LIPITOR Take 1 tablet (40 mg total) by mouth  daily.   CALCIUM 600 + D PO Take 1 tablet by mouth daily.   Dialyvite/Zinc Tabs Take 1 tablet by mouth daily.   ferric citrate 1 GM 210 MG(Fe) tablet Commonly known as: AURYXIA Take 210 mg by mouth 3 (three) times daily with meals.   midodrine 5 MG tablet Commonly known as: PROAMATINE Take 15 mg by mouth 3 (three) times daily.   Xphozah 30 MG Tabs Generic drug: Tenapanor HCl (CKD) Take 30 mg by mouth in the morning and at bedtime.       No Known Allergies    The results of significant diagnostics from this hospitalization (including imaging, microbiology, ancillary and laboratory) are listed below for reference.    Significant Diagnostic Studies: ECHOCARDIOGRAM COMPLETE  Result Date: 04/03/2023    ECHOCARDIOGRAM REPORT   Patient Name:   [name] Date of Exam: 04/03/2023 Medical Rec #:  [id number]         Height:       74.0 in Accession #:    [id number]        Weight:       304.0 lb Date of Birth:  [birth year]         BSA:          2.597 m Patient Age:    47 years          BP:           101/57 mmHg Patient Gender: M                 HR:           74 bpm. Exam Location:  Inpatient Procedure: 2D Echo, Color Doppler and Cardiac Doppler Indications:    Bacteremia  History:        Patient has prior history of Echocardiogram examinations, most                 recent 12/07/2022. CHF, Arrythmias:Atrial Fibrillation; Risk                 Factors:Hypertension, Diabetes, Dyslipidemia, ESRD and Sleep                 Apnea.  Sonographer:    Irving Burton Senior RDCS Referring Phys: [id number] Lorin Glass  Sonographer Comments: Suboptimal subcostal window. Technically difficult due to patient body habitus. IMPRESSIONS  1. Left ventricular ejection fraction, by estimation, is 60 to 65%. The left ventricle has normal function. The left ventricle has no regional wall motion abnormalities. Left ventricular diastolic parameters were normal.  2. Right ventricular systolic function is mildly reduced. The  right ventricular size is normal. There is normal pulmonary artery systolic pressure. The estimated right ventricular systolic pressure is 22.5 mmHg.  3. The mitral valve is grossly normal. Trivial mitral valve regurgitation. No evidence of mitral stenosis.  4. The aortic valve was not well visualized. Aortic valve regurgitation is not visualized. Aortic valve sclerosis is present, with no evidence of aortic valve stenosis.  5. The inferior vena cava is normal in size with greater than 50% respiratory variability, suggesting right  soft  Discharge Instructions    Allergies as of 04/06/2023   No Known Allergies      Medication List     TAKE these medications    apixaban 5 MG Tabs tablet Commonly known as: ELIQUIS Take 5 mg by mouth 2 (two) times daily.   atorvastatin 40 MG tablet Commonly known as: LIPITOR Take 1 tablet (40 mg total) by mouth  daily.   CALCIUM 600 + D PO Take 1 tablet by mouth daily.   Dialyvite/Zinc Tabs Take 1 tablet by mouth daily.   ferric citrate 1 GM 210 MG(Fe) tablet Commonly known as: AURYXIA Take 210 mg by mouth 3 (three) times daily with meals.   midodrine 5 MG tablet Commonly known as: PROAMATINE Take 15 mg by mouth 3 (three) times daily.   Xphozah 30 MG Tabs Generic drug: Tenapanor HCl (CKD) Take 30 mg by mouth in the morning and at bedtime.       No Known Allergies    The results of significant diagnostics from this hospitalization (including imaging, microbiology, ancillary and laboratory) are listed below for reference.    Significant Diagnostic Studies: ECHOCARDIOGRAM COMPLETE  Result Date: 04/03/2023    ECHOCARDIOGRAM REPORT   Patient Name:   [name] Date of Exam: 04/03/2023 Medical Rec #:  [id number]         Height:       74.0 in Accession #:    [id number]        Weight:       304.0 lb Date of Birth:  [birth year]         BSA:          2.597 m Patient Age:    47 years          BP:           101/57 mmHg Patient Gender: M                 HR:           74 bpm. Exam Location:  Inpatient Procedure: 2D Echo, Color Doppler and Cardiac Doppler Indications:    Bacteremia  History:        Patient has prior history of Echocardiogram examinations, most                 recent 12/07/2022. CHF, Arrythmias:Atrial Fibrillation; Risk                 Factors:Hypertension, Diabetes, Dyslipidemia, ESRD and Sleep                 Apnea.  Sonographer:    Irving Burton Senior RDCS Referring Phys: [id number] Lorin Glass  Sonographer Comments: Suboptimal subcostal window. Technically difficult due to patient body habitus. IMPRESSIONS  1. Left ventricular ejection fraction, by estimation, is 60 to 65%. The left ventricle has normal function. The left ventricle has no regional wall motion abnormalities. Left ventricular diastolic parameters were normal.  2. Right ventricular systolic function is mildly reduced. The  right ventricular size is normal. There is normal pulmonary artery systolic pressure. The estimated right ventricular systolic pressure is 22.5 mmHg.  3. The mitral valve is grossly normal. Trivial mitral valve regurgitation. No evidence of mitral stenosis.  4. The aortic valve was not well visualized. Aortic valve regurgitation is not visualized. Aortic valve sclerosis is present, with no evidence of aortic valve stenosis.  5. The inferior vena cava is normal in size with greater than 50% respiratory variability, suggesting right  Physician Discharge Summary  [name] RUE:[id number] DOB: Dec 28, 1975 DOA: 04/01/2023  PCP: Robert Mast, MD  Admit date: 04/01/2023 Discharge date: 04/06/2023  Time spent: 46 minutes  Recommendations for Outpatient Follow-up:  Rec Ceftazidime for several weeks and then a Blood culture 3-5 days after to see if bactermia has cleared--if it does not clear then patient may be able to pursue hero graft placement at [facility] for dialysis access  Discharge Diagnoses:  MAIN problem for hospitalization   Thick shock secondary to Morganella CLABSI [facility] [facility] [place] CKD baseline weight 137  Permanent A-fib on Eliquis  Please see below for itemized issues addressed in HOpsital- refer to other progress notes for clarity if needed  Discharge Condition: Improved  Diet recommendation: Renal  Filed Weights   04/03/23 0353 04/04/23 0447 04/05/23 0500  Weight: (!) 137.9 kg 132.6 kg 132.6 kg    History of present illness:  47 year old black male DM TY 2 super morbid obesity previously Permanent atrial fibrillation on Eliquis HFpEF ESRD HD 2008 DDKT transplant 2014 with limited accesses follows with [facility] vascular Dr. Davis--currently dialyzing through left femoral HD cath long-term plan is hero graft? Right foot ulcer status post R great toe amputation 12/12/2022 daily Last hospitalization 8/20 through 02/05/2023 with gram-negative septic shock was on pressors grew Proteus   Came back to ED 10/23 chills malaise purulent discharge left TDC with missed dialysis bicarb 17 WBC 5. Drainage around left femoral TDC bolused fluids broad-spectrum antibiotics given Off pressors since 10/26 HD line was exchanged 10/24, 10/25 echo was performed which showed no veg     Plan   Septic shock 2/2 Morganella CLABSI cath tip growing morganella 10/24, BC neg--empiric coverage ~  Poor access limits line holiday --d/w Dr Luciana Axe infectious disease 10/27 and went over the entire clinical  scenario in addition to the workup inclusive of negative TTE --rec's 3 weeks Post Hd Ceftazidime and then TOC with BC 3-5 days after d/c Abx---hopefully then can place Hero access with his specialist at [facility] who he has an appointment with on 10/30 to discuss the option and the planning   ESRD TTS [facility], EDW 137 Kg [has lost weight since July intentionally] hyperk Defer to renal--cont midodrine 15 tid.  Hold albumin. Phos is ok, Calcium a little low [facility] will be placing hero line--I have the patient to call [facility] And ensure that they are aware of his discharge from the hospital with bacteremia so that they can appropriately schedule his follow-up appointment for the hero graft placement-he is aware he needs to call  hemoglobin good, cannot give IV iron secondary to active infection--give po Auryxia 210 tid--ESA per renal and will need iron studies etc. as an outpatient   Permanent A-fib on Eliquis CHA2DS2-VASc >4-cont Apixaban 5 bid Hypotension limits rt control Patient was predominantly rate controlled during hospital stay without any agent who brought    Thrombocytopenia critical illness Right foot ulcers with prior ray amputation 12/2022 Wound looks very clean on my assessment prior to discharge   Discharge Exam: Vitals:   04/05/23 1905 04/06/23 0837  BP: 113/73 108/80  Pulse: (!) 55 67  Resp:  16  Temp: 98 F (36.7 C)   SpO2: 100% 100%    Subj on day of d/c   Looks well feels fair  General Exam on discharge  EOMI NCAT no focal deficit S1 and s2 no murmur--- seems to be in sinus Catheter in left lower extremity seems like clean abdomen is  soft  Discharge Instructions    Allergies as of 04/06/2023   No Known Allergies      Medication List     TAKE these medications    apixaban 5 MG Tabs tablet Commonly known as: ELIQUIS Take 5 mg by mouth 2 (two) times daily.   atorvastatin 40 MG tablet Commonly known as: LIPITOR Take 1 tablet (40 mg total) by mouth  daily.   CALCIUM 600 + D PO Take 1 tablet by mouth daily.   Dialyvite/Zinc Tabs Take 1 tablet by mouth daily.   ferric citrate 1 GM 210 MG(Fe) tablet Commonly known as: AURYXIA Take 210 mg by mouth 3 (three) times daily with meals.   midodrine 5 MG tablet Commonly known as: PROAMATINE Take 15 mg by mouth 3 (three) times daily.   Xphozah 30 MG Tabs Generic drug: Tenapanor HCl (CKD) Take 30 mg by mouth in the morning and at bedtime.       No Known Allergies    The results of significant diagnostics from this hospitalization (including imaging, microbiology, ancillary and laboratory) are listed below for reference.    Significant Diagnostic Studies: ECHOCARDIOGRAM COMPLETE  Result Date: 04/03/2023    ECHOCARDIOGRAM REPORT   Patient Name:   [name] Date of Exam: 04/03/2023 Medical Rec #:  [id number]         Height:       74.0 in Accession #:    [id number]        Weight:       304.0 lb Date of Birth:  [birth year]         BSA:          2.597 m Patient Age:    47 years          BP:           101/57 mmHg Patient Gender: M                 HR:           74 bpm. Exam Location:  Inpatient Procedure: 2D Echo, Color Doppler and Cardiac Doppler Indications:    Bacteremia  History:        Patient has prior history of Echocardiogram examinations, most                 recent 12/07/2022. CHF, Arrythmias:Atrial Fibrillation; Risk                 Factors:Hypertension, Diabetes, Dyslipidemia, ESRD and Sleep                 Apnea.  Sonographer:    Irving Burton Senior RDCS Referring Phys: [id number] Lorin Glass  Sonographer Comments: Suboptimal subcostal window. Technically difficult due to patient body habitus. IMPRESSIONS  1. Left ventricular ejection fraction, by estimation, is 60 to 65%. The left ventricle has normal function. The left ventricle has no regional wall motion abnormalities. Left ventricular diastolic parameters were normal.  2. Right ventricular systolic function is mildly reduced. The  right ventricular size is normal. There is normal pulmonary artery systolic pressure. The estimated right ventricular systolic pressure is 22.5 mmHg.  3. The mitral valve is grossly normal. Trivial mitral valve regurgitation. No evidence of mitral stenosis.  4. The aortic valve was not well visualized. Aortic valve regurgitation is not visualized. Aortic valve sclerosis is present, with no evidence of aortic valve stenosis.  5. The inferior vena cava is normal in size with greater than 50% respiratory variability, suggesting right  soft  Discharge Instructions    Allergies as of 04/06/2023   No Known Allergies      Medication List     TAKE these medications    apixaban 5 MG Tabs tablet Commonly known as: ELIQUIS Take 5 mg by mouth 2 (two) times daily.   atorvastatin 40 MG tablet Commonly known as: LIPITOR Take 1 tablet (40 mg total) by mouth  daily.   CALCIUM 600 + D PO Take 1 tablet by mouth daily.   Dialyvite/Zinc Tabs Take 1 tablet by mouth daily.   ferric citrate 1 GM 210 MG(Fe) tablet Commonly known as: AURYXIA Take 210 mg by mouth 3 (three) times daily with meals.   midodrine 5 MG tablet Commonly known as: PROAMATINE Take 15 mg by mouth 3 (three) times daily.   Xphozah 30 MG Tabs Generic drug: Tenapanor HCl (CKD) Take 30 mg by mouth in the morning and at bedtime.       No Known Allergies    The results of significant diagnostics from this hospitalization (including imaging, microbiology, ancillary and laboratory) are listed below for reference.    Significant Diagnostic Studies: ECHOCARDIOGRAM COMPLETE  Result Date: 04/03/2023    ECHOCARDIOGRAM REPORT   Patient Name:   [name] Date of Exam: 04/03/2023 Medical Rec #:  [id number]         Height:       74.0 in Accession #:    [id number]        Weight:       304.0 lb Date of Birth:  [birth year]         BSA:          2.597 m Patient Age:    47 years          BP:           101/57 mmHg Patient Gender: M                 HR:           74 bpm. Exam Location:  Inpatient Procedure: 2D Echo, Color Doppler and Cardiac Doppler Indications:    Bacteremia  History:        Patient has prior history of Echocardiogram examinations, most                 recent 12/07/2022. CHF, Arrythmias:Atrial Fibrillation; Risk                 Factors:Hypertension, Diabetes, Dyslipidemia, ESRD and Sleep                 Apnea.  Sonographer:    Irving Burton Senior RDCS Referring Phys: [id number] Lorin Glass  Sonographer Comments: Suboptimal subcostal window. Technically difficult due to patient body habitus. IMPRESSIONS  1. Left ventricular ejection fraction, by estimation, is 60 to 65%. The left ventricle has normal function. The left ventricle has no regional wall motion abnormalities. Left ventricular diastolic parameters were normal.  2. Right ventricular systolic function is mildly reduced. The  right ventricular size is normal. There is normal pulmonary artery systolic pressure. The estimated right ventricular systolic pressure is 22.5 mmHg.  3. The mitral valve is grossly normal. Trivial mitral valve regurgitation. No evidence of mitral stenosis.  4. The aortic valve was not well visualized. Aortic valve regurgitation is not visualized. Aortic valve sclerosis is present, with no evidence of aortic valve stenosis.  5. The inferior vena cava is normal in size with greater than 50% respiratory variability, suggesting right  Physician Discharge Summary  [name] RUE:[id number] DOB: Dec 28, 1975 DOA: 04/01/2023  PCP: Robert Mast, MD  Admit date: 04/01/2023 Discharge date: 04/06/2023  Time spent: 46 minutes  Recommendations for Outpatient Follow-up:  Rec Ceftazidime for several weeks and then a Blood culture 3-5 days after to see if bactermia has cleared--if it does not clear then patient may be able to pursue hero graft placement at [facility] for dialysis access  Discharge Diagnoses:  MAIN problem for hospitalization   Thick shock secondary to Morganella CLABSI [facility] [facility] [place] CKD baseline weight 137  Permanent A-fib on Eliquis  Please see below for itemized issues addressed in HOpsital- refer to other progress notes for clarity if needed  Discharge Condition: Improved  Diet recommendation: Renal  Filed Weights   04/03/23 0353 04/04/23 0447 04/05/23 0500  Weight: (!) 137.9 kg 132.6 kg 132.6 kg    History of present illness:  47 year old black male DM TY 2 super morbid obesity previously Permanent atrial fibrillation on Eliquis HFpEF ESRD HD 2008 DDKT transplant 2014 with limited accesses follows with [facility] vascular Dr. Davis--currently dialyzing through left femoral HD cath long-term plan is hero graft? Right foot ulcer status post R great toe amputation 12/12/2022 daily Last hospitalization 8/20 through 02/05/2023 with gram-negative septic shock was on pressors grew Proteus   Came back to ED 10/23 chills malaise purulent discharge left TDC with missed dialysis bicarb 17 WBC 5. Drainage around left femoral TDC bolused fluids broad-spectrum antibiotics given Off pressors since 10/26 HD line was exchanged 10/24, 10/25 echo was performed which showed no veg     Plan   Septic shock 2/2 Morganella CLABSI cath tip growing morganella 10/24, BC neg--empiric coverage ~  Poor access limits line holiday --d/w Dr Luciana Axe infectious disease 10/27 and went over the entire clinical  scenario in addition to the workup inclusive of negative TTE --rec's 3 weeks Post Hd Ceftazidime and then TOC with BC 3-5 days after d/c Abx---hopefully then can place Hero access with his specialist at [facility] who he has an appointment with on 10/30 to discuss the option and the planning   ESRD TTS [facility], EDW 137 Kg [has lost weight since July intentionally] hyperk Defer to renal--cont midodrine 15 tid.  Hold albumin. Phos is ok, Calcium a little low [facility] will be placing hero line--I have the patient to call [facility] And ensure that they are aware of his discharge from the hospital with bacteremia so that they can appropriately schedule his follow-up appointment for the hero graft placement-he is aware he needs to call  hemoglobin good, cannot give IV iron secondary to active infection--give po Auryxia 210 tid--ESA per renal and will need iron studies etc. as an outpatient   Permanent A-fib on Eliquis CHA2DS2-VASc >4-cont Apixaban 5 bid Hypotension limits rt control Patient was predominantly rate controlled during hospital stay without any agent who brought    Thrombocytopenia critical illness Right foot ulcers with prior ray amputation 12/2022 Wound looks very clean on my assessment prior to discharge   Discharge Exam: Vitals:   04/05/23 1905 04/06/23 0837  BP: 113/73 108/80  Pulse: (!) 55 67  Resp:  16  Temp: 98 F (36.7 C)   SpO2: 100% 100%    Subj on day of d/c   Looks well feels fair  General Exam on discharge  EOMI NCAT no focal deficit S1 and s2 no murmur--- seems to be in sinus Catheter in left lower extremity seems like clean abdomen is  Physician Discharge Summary  [name] RUE:[id number] DOB: Dec 28, 1975 DOA: 04/01/2023  PCP: Robert Mast, MD  Admit date: 04/01/2023 Discharge date: 04/06/2023  Time spent: 46 minutes  Recommendations for Outpatient Follow-up:  Rec Ceftazidime for several weeks and then a Blood culture 3-5 days after to see if bactermia has cleared--if it does not clear then patient may be able to pursue hero graft placement at [facility] for dialysis access  Discharge Diagnoses:  MAIN problem for hospitalization   Thick shock secondary to Morganella CLABSI [place]D [facility] [place] CKD baseline weight 137  Permanent A-fib on Eliquis  Please see below for itemized issues addressed in HOpsital- refer to other progress notes for clarity if needed  Discharge Condition: Improved  Diet recommendation: Renal  Filed Weights   04/03/23 0353 04/04/23 0447 04/05/23 0500  Weight: (!) 137.9 kg 132.6 kg 132.6 kg    History of present illness:  47 year old black male DM TY 2 super morbid obesity previously Permanent atrial fibrillation on Eliquis HFpEF ESRD HD 2008 DDKT transplant 2014 with limited accesses follows with [facility] vascular Dr. Davis--currently dialyzing through left femoral HD cath long-term plan is hero graft? Right foot ulcer status post R great toe amputation 12/12/2022 daily Last hospitalization 8/20 through 02/05/2023 with gram-negative septic shock was on pressors grew Proteus   Came back to ED 10/23 chills malaise purulent discharge left TDC with missed dialysis bicarb 17 WBC 5. Drainage around left femoral TDC bolused fluids broad-spectrum antibiotics given Off pressors since 10/26 HD line was exchanged 10/24, 10/25 echo was performed which showed no veg     Plan   Septic shock 2/2 Morganella CLABSI cath tip growing morganella 10/24, BC neg--empiric coverage ~  Poor access limits line holiday --d/w Dr Luciana Axe infectious disease 10/27 and went over the entire clinical  scenario in addition to the workup inclusive of negative TTE --rec's 3 weeks Post Hd Ceftazidime and then TOC with BC 3-5 days after d/c Abx---hopefully then can place Hero access with his specialist at [facility] who he has an appointment with on 10/30 to discuss the option and the planning   ESRD TTS [facility], EDW 137 Kg [has lost weight since July intentionally] hyperk Defer to renal--cont midodrine 15 tid.  Hold albumin. Phos is ok, Calcium a little low [facility] will be placing hero line--I have the patient to call [facility] And ensure that they are aware of his discharge from the hospital with bacteremia so that they can appropriately schedule his follow-up appointment for the hero graft placement-he is aware he needs to call  hemoglobin good, cannot give IV iron secondary to active infection--give po Auryxia 210 tid--ESA per renal and will need iron studies etc. as an outpatient   Permanent A-fib on Eliquis CHA2DS2-VASc >4-cont Apixaban 5 bid Hypotension limits rt control Patient was predominantly rate controlled during hospital stay without any agent who brought    Thrombocytopenia critical illness Right foot ulcers with prior ray amputation 12/2022 Wound looks very clean on my assessment prior to discharge   Discharge Exam: Vitals:   04/05/23 1905 04/06/23 0837  BP: 113/73 108/80  Pulse: (!) 55 67  Resp:  16  Temp: 98 F (36.7 C)   SpO2: 100% 100%    Subj on day of d/c   Looks well feels fair  General Exam on discharge  EOMI NCAT no focal deficit S1 and s2 no murmur--- seems to be in sinus Catheter in left lower extremity seems like clean abdomen is

## 2023-04-06 NOTE — Progress Notes (Signed)
Advised that pt is for d/c today. Contacted [facility] to advise clinic of pt's d/c today and that pt should resume care tomorrow. Renal NP has contacted clinic to make aware of pt's iv abx needs at d/c and to send orders.   Olivia Canter Renal Navigator [phone]

## 2023-04-06 NOTE — Discharge Planning (Signed)
[facility] Patient Discharge Orders- [facility] CLINIC: [facility]  Patient's name: [name] [facility] Admit/DC Dates: 04/01/2023 - 04/06/23  Discharge Diagnoses: Septic shock 2nd catheter related infection, s/p [facility] exchange 10/24. Bcx (+) morganella morganii. See below for ABXs details  Hyperkalemia - resolved with HD  Aranesp: Given: Yes  Date and amount of last dose: on 04/04/23   Last Hgb: 9.8 PRBC's Given: No ESA dose for discharge: mircera 100 mcg IV q 2 weeks  IV Iron dose at discharge: No iron while on ABXs!  Heparin change: No  EDW Change: Yes New EDW: Lower to 135.5kg. Unclear if these were standing weights. Notify medical team for any discrepancies.  Bath Change: No  Access intervention/Change: Yes Details: S/p TDC exchange on 10/24  Hectorol change: No  Discharge Labs: Calcium 8.3 Phosphorus 6.0 Albumin 2.8 K+ 4.7  IV Antibiotics: Yes Details: (+) Morganella morganii. Start Ceftazidime 2GM IV X 3 weeks from 04/07/23. End date is 04/28/23.  On Coumadin?: No, on Eliquis   OTHER/APPTS/LAB ORDERS: Per DC summary, we need to obtain blood cultures 3-5 days after ABX completion (end date is on 04/28/23) to ensure the bacteremia has cleared. If it does not clear then patient may be able to pursue HERO graft placement at [facility] for dialysis access. We need f/u with [facility] to see what the plans are for future access!     D/C Meds to be reconciled by nurse after every discharge.  Completed By: Salome Holmes, NP   Reviewed by: MD:______ RN_______

## 2023-04-06 NOTE — Progress Notes (Signed)
PT Cancellation Note  Patient Details Name: [name] MRN: [id number] DOB: [birth year]   Cancelled Treatment:    Reason Eval/Treat Not Completed: PT screened, no needs identified, will sign off. Pt mobilizing independently per pt and nurse tech.   [name] [facility] 04/06/2023, 12:49 PM Skip Mayer PT Acute [employer] [phone]

## 2023-04-06 NOTE — Care Management Important Message (Signed)
Important Message  Patient Details  Name: [name] MRN: [id number] Date of Birth: March 25, 1976   Important Message Given:  Yes - Medicare IM     Dorena Bodo 04/06/2023, 3:41 PM

## 2023-04-07 ENCOUNTER — Telehealth: Payer: Self-pay

## 2023-04-07 DIAGNOSIS — Z992 Dependence on renal dialysis: Secondary | ICD-10-CM | POA: Diagnosis not present

## 2023-04-07 DIAGNOSIS — N2581 Secondary hyperparathyroidism of renal origin: Secondary | ICD-10-CM | POA: Diagnosis not present

## 2023-04-07 DIAGNOSIS — N186 End stage renal disease: Secondary | ICD-10-CM | POA: Diagnosis not present

## 2023-04-07 LAB — HEPATITIS B SURFACE ANTIBODY, QUANTITATIVE: Hep B S AB Quant (Post): 60.3 m[IU]/mL

## 2023-04-07 NOTE — Transitions of Care (Post Inpatient/ED Visit) (Signed)
04/07/2023  Name: [name] MRN: [id number] DOB: 05/12/1976  Today's TOC FU Call Status: Today's TOC FU Call Status:: Unsuccessful Call (1st Attempt) Unsuccessful Call (1st Attempt) Date: 04/07/23 (Patient requested call back tomorrow)  Attempted to reach the patient regarding the most recent Inpatient/ED visit. Patient was unable to talk, requested call back tomorrow.  Follow Up Plan: Additional outreach attempts will be made to reach the patient to complete the Transitions of Care (Post Inpatient/ED visit) call.   Jodelle Gross RN, BSN, CCM RN Care Manager  Transitions of Care  [facility] - [facility]  [phone]

## 2023-04-07 NOTE — TOC Transition Note (Signed)
Transition of Care - Initial Contact from Inpatient Facility  Date of discharge: 04/06/23 Date of contact: 04/07/23  Method: Phone Spoke to: Patient  Patient contacted to discuss transition of care from recent inpatient hospitalization. Patient was admitted to [facility] from 04/01/23-04/06/23 with discharge diagnosis of Morganella morganii bacteremia.  The discharge medication list was reviewed.   Patient received HD today at [facility]. Next HD on 04/09/23.  Salome Holmes, NP

## 2023-04-08 ENCOUNTER — Telehealth: Payer: Self-pay

## 2023-04-08 ENCOUNTER — Ambulatory Visit
Admission: RE | Admit: 2023-04-08 | Discharge: 2023-04-08 | Disposition: A | Discharge status: Home / Self Care | Payer: Medicare HMO | Source: Ambulatory Visit | Attending: Nephrology | Admitting: Nephrology

## 2023-04-08 ENCOUNTER — Encounter: Payer: Self-pay | Admitting: Podiatry

## 2023-04-08 ENCOUNTER — Ambulatory Visit (INDEPENDENT_AMBULATORY_CARE_PROVIDER_SITE_OTHER): Payer: Medicare HMO | Admitting: Podiatry

## 2023-04-08 VITALS — Ht 74.0 in | Wt 292.3 lb

## 2023-04-08 DIAGNOSIS — T8130XA Disruption of wound, unspecified, initial encounter: Secondary | ICD-10-CM

## 2023-04-08 DIAGNOSIS — I7090 Unspecified atherosclerosis: Secondary | ICD-10-CM

## 2023-04-08 DIAGNOSIS — I82290 Acute embolism and thrombosis of other thoracic veins: Secondary | ICD-10-CM | POA: Diagnosis not present

## 2023-04-08 DIAGNOSIS — E1142 Type 2 diabetes mellitus with diabetic polyneuropathy: Secondary | ICD-10-CM

## 2023-04-08 DIAGNOSIS — I745 Embolism and thrombosis of iliac artery: Secondary | ICD-10-CM | POA: Diagnosis not present

## 2023-04-08 DIAGNOSIS — I82220 Acute embolism and thrombosis of inferior vena cava: Secondary | ICD-10-CM | POA: Diagnosis not present

## 2023-04-08 HISTORY — PX: IR RADIOLOGIST EVAL & MGMT: IMG5224

## 2023-04-08 LAB — CULTURE, BLOOD (ROUTINE X 2)
Culture: NO GROWTH
Culture: NO GROWTH
Special Requests: ADEQUATE
Special Requests: ADEQUATE

## 2023-04-08 NOTE — Progress Notes (Signed)
Subjective:  Patient ID: [name], male    DOB: 08/10/1975,  MRN: [id number]  Chief Complaint  Patient presents with   Wound Check    Wound check on right foot.    DOS: 02/01/23 Procedure: Right foot irrigation and debridement with application of graft and wound vac  47 y.o. male returns for POV#5. Relates doing well and managing pain. Wound is doing better and healed.   Review of Systems: Negative except as noted in the HPI. Denies N/V/F/Ch.  Past Medical History:  Diagnosis Date   Acute respiratory failure with hypoxia (HCC) 11/30/2018   Anemia    ESRD   ESRD on hemodialysis (HCC) 06/07/2011   TTS [facility]. Started HD in 2006, got transplant in 2014 lasted until Dec 2019 then went back on HD.  Has L thigh AVG as of Jun 2020.  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    History of blood transfusion    Hypertension    03/19/22- has not had high blood pressure in 5 years.   Morbid obesity (HCC)    Paroxysmal atrial fibrillation (HCC)    Pre-diabetes    no meds   Sepsis (HCC) 11/30/2018   Sleep apnea    lost weight, does not use CPAP    Current Outpatient Medications:    apixaban (ELIQUIS) 5 MG TABS tablet, Take 5 mg by mouth 2 (two) times daily., Disp: , Rfl:    atorvastatin (LIPITOR) 40 MG tablet, Take 1 tablet (40 mg total) by mouth daily., Disp: 90 tablet, Rfl: 3   Calcium Carb-Cholecalciferol (CALCIUM 600 + D PO), Take 1 tablet by mouth daily., Disp: , Rfl:    cefTAZidime 2 g in dextrose 5 % 50 mL, Inject 2 g into the vein Every Tuesday,Thursday,and Saturday with dialysis. TTS post HD until 11/17, Disp: 2 g, Rfl: 0   ferric citrate (AURYXIA) 1 GM 210 MG(Fe) tablet, Take 210 mg by mouth 3 (three) times daily with meals., Disp: , Rfl:    midodrine (PROAMATINE) 5 MG tablet, Take 15 mg by mouth 3 (three) times daily., Disp: , Rfl:    Tenapanor HCl, CKD, (XPHOZAH) 30 MG TABS, Take 30 mg by mouth in the morning and at bedtime., Disp: , Rfl:   Social  History   Tobacco Use  Smoking Status Former   Current packs/day: 0.00   Types: Cigarettes   Quit date: 11/08/2015   Years since quitting: 7.4  Smokeless Tobacco Never    No Known Allergies Objective:  There were no vitals filed for this visit. Body mass index is 37.53 kg/m. Constitutional Well developed. Well nourished.  Vascular Foot warm and well perfused. Capillary refill normal to all digits.   Neurologic Normal speech. Oriented to person, place, and time. Epicritic sensation to light touch grossly present bilaterally.  Dermatologic Wound dehiscence healed. .  No concerns noted on the left foot.   Orthopedic: Tenderness to palpation noted about the surgical site.    Assessment:   1. Diabetic ulcer of other part of right foot associated with diabetes mellitus due to underlying condition, with fat layer exposed (HCC)   2. Wound dehiscence   3. Diabetic peripheral neuropathy (HCC)       Plan:  Patient was evaluated and treated and all questions answered.  S/p foot surgery right Ulcer right foot -healed -Dressed with betadine, DSD. -Off-loading with surgical shoe. -No abx indicated.  -Awaiting DM shoes -Discussed glucose control and proper protein-rich diet.  -Discussed if  any worsening redness, pain, fever or chills to call or may need to report to the emergency room. Patient expressed understanding.    No follow-ups on file.   Return in 4 weeks for recheck.   No follow-ups on file.

## 2023-04-08 NOTE — Transitions of Care (Post Inpatient/ED Visit) (Signed)
04/08/2023  Name: NAHIR DAURIO MRN: [id number] DOB: 1976/06/01  Today's TOC FU Call Status: Today's TOC FU Call Status:: Unsuccessful Call (2nd Attempt) Unsuccessful Call (2nd Attempt) Date: 04/08/23  Attempted to reach the patient regarding the most recent Inpatient/ED visit.  Follow Up Plan: Additional outreach attempts will be made to reach the patient to complete the Transitions of Care (Post Inpatient/ED visit) call.   Jodelle Gross RN, BSN, CCM RN Care Manager  Transitions of Care  [facility] - [facility]  [phone]

## 2023-04-08 NOTE — Consult Note (Addendum)
Chief Complaint: Hemodialysis access problems  Referring Physician(s): Lin,James W  History of Present Illness: [name] is a 47 y.o. male presenting to VIR clinic today, kindly referred by Dr. Juel Burrow of Nephrology team, to discuss his options with possible venogram and venous reconstruction of his known iliac and IVC occlusions.   [name] joins [place] today by telephone for the interview.  We confirmed his identity with 2 personal identifiers.   [name] has a long history of renal failure/CKD, starting hemodialysis in 2006 by our records. He had prior renal transplant, which was functional from 2014 - 2019, at which time he needed HD support again.   He was recently admitted in August to [facility] with foot infection, septic shock, osteomyelitis, HD catheter infection.  During that stay, he was worked up for possible trans-hepatic catheter, as he has known bilateral brachiocephalic vein occlusion, known IVC occlusion, and known iliac vein occlusion.   He has had prior upper extremity tunneled HD catheters.  He has had prior lower extremity bilateral tunneled HD catheters.  He has had bilateral femoral loop grafts, both ultimately non-functional, with the left thrombosed and the right surgically excised. I do not have all of the surgical reports of the previous upper extremity fistulas/grafts, however, our imaging records supports intervention on prior upper extremity circuits dating to 2008.    Today during our discussion, he references that he is "supposed to give the people at [facility] a call about a HeRO" graft.   Currently he is dialyzing through a left femoral tunneled HD catheter, which was recently exchanged 04/02/23.   Past Medical History:  Diagnosis Date   Acute respiratory failure with hypoxia (HCC) 11/30/2018   Anemia    ESRD   ESRD on hemodialysis (HCC) 06/07/2011   TTS [place]. Started HD in 2006, got transplant in 2014 lasted until Dec 2019 then went back on HD.   Has L thigh AVG as of Jun 2020.  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    History of blood transfusion    Hypertension    03/19/22- has not had high blood pressure in 5 years.   Morbid obesity (HCC)    Paroxysmal atrial fibrillation (HCC)    Pre-diabetes    no meds   Sepsis (HCC) 11/30/2018   Sleep apnea    lost weight, does not use CPAP    Past Surgical History:  Procedure Laterality Date   ALLOGRAFT APPLICATION Right 02/01/2023   Procedure: ALLOGRAFT [facility];  Surgeon: Edwin Cap, DPM;  Location: [facility] OR;  Service: Orthopedics/Podiatry;  Laterality: Right;   AMPUTATION Right 12/12/2022   Procedure: AMPUTATION RAY;  Surgeon: Louann Sjogren, DPM;  Location: [facility] OR;  Service: Orthopedics/Podiatry;  Laterality: Right;  surgical team to do local block   APPLICATION OF WOUND VAC Right 02/01/2023   Procedure: APPLICATION OF WOUND VAC;  Surgeon: Edwin Cap, DPM;  Location: [facility] OR;  Service: Orthopedics/Podiatry;  Laterality: Right;   ARTERIOVENOUS GRAFT PLACEMENT  08/09/2010   Left Thigh Graft by Dr. Cari Caraway   AV FISTULA PLACEMENT Right 05/18/2018   Procedure: INSERTION OF ARTERIOVENOUS (AV) GORE-TEX GRAFT Left THIGH;  Surgeon: Maeola Harman, MD;  Location: [facility] OR;  Service: Vascular;  Laterality: Right;   AV FISTULA PLACEMENT Right 02/15/2021   Procedure: INSERTION OF ARTERIOVENOUS (AV) GORE-TEX LOOP GRAFT RIGHT THIGH;  Surgeon: Maeola Harman, MD;  Location: [facility] OR;  Service: Vascular;  Laterality: Right;   BIOPSY  05/20/2022   Procedure: BIOPSY;  Surgeon: Shellia Cleverly, DO;  Location: [facility];  Service: Gastroenterology;;   CAPD INSERTION N/A 06/13/2022   Procedure: LAPAROSCOPIC INSERTION CONTINUOUS AMBULATORY PERITONEAL DIALYSIS  (CAPD) CATHETER;  Surgeon: Maeola Harman, MD;  Location: [facility] OR;  Service: Vascular;  Laterality: N/A;   CAPD REMOVAL  05/08/2011   Procedure: CONTINUOUS AMBULATORY PERITONEAL DIALYSIS   (CAPD) CATHETER REMOVAL;  Surgeon: Iona Coach, MD;  Location: [facility] OR;  Service: General;  Laterality: N/A;  Removal of CAPD catheter, Dr. requests to go after 100   CAPD REMOVAL N/A 08/08/2022   Procedure: REMOVAL CONTINUOUS AMBULATORY PERITONEAL DIALYSIS  (CAPD) CATHETER;  Surgeon: Maeola Harman, MD;  Location: [facility] OR;  Service: Vascular;  Laterality: N/A;   COLONOSCOPY WITH PROPOFOL N/A 05/20/2022   Procedure: COLONOSCOPY WITH PROPOFOL;  Surgeon: Shellia Cleverly, DO;  Location: [facility];  Service: Gastroenterology;  Laterality: N/A;   I & D EXTREMITY Right 12/08/2022   Procedure: IRRIGATION AND DEBRIDEMENT RIGHT FOOT;  Surgeon: Louann Sjogren, DPM;  Location: [facility] OR;  Service: Orthopedics/Podiatry;  Laterality: Right;   I & D EXTREMITY Right 02/01/2023   Procedure: IRRIGATION AND DEBRIDEMENT RIGHT FOOT;  Surgeon: Edwin Cap, DPM;  Location: [facility] OR;  Service: Orthopedics/Podiatry;  Laterality: Right;   INSERTION OF DIALYSIS CATHETER  10/05/2010   Right Femoral Cath insertion by Dr. Leonides Sake.  Pt ahas had several caths inserted.   INSERTION OF DIALYSIS CATHETER Right 02/15/2021   Procedure: Attempted INSERTION OF RIGHT and Left Internal Jugular DIALYSIS CATHETER, Insertion of Left Femoral Vein Dialysis Catheter;  Surgeon: Maeola Harman, MD;  Location: [facility] OR;  Service: Vascular;  Laterality: Right;   INSERTION OF DIALYSIS CATHETER Right 04/26/2021   Procedure: INSERTION OF TUNNELED 55cm PALIDROME PRECISION CHRONIC DIALYSIS CATHETER;  Surgeon: Leonie Douglas, MD;  Location: [facility] OR;  Service: Vascular;  Laterality: Right;   INSERTION OF DIALYSIS CATHETER Left 12/26/2021   Procedure: INSERTION OF TUNNELED  DIALYSIS CATHETER LEFT FEMORAL ARTERY;  Surgeon: Maeola Harman, MD;  Location: [facility] OR;  Service: Vascular;  Laterality: Left;   INSERTION OF DIALYSIS CATHETER Left 03/06/2022   Procedure: INSERTION OF DIALYSIS CATHETER;  Surgeon: Maeola Harman, MD;  Location: [facility] OR;  Service: Vascular;  Laterality: Left;   INSERTION OF DIALYSIS CATHETER Left 12/31/2022   Procedure: INSERTION OF DIALYSIS CATHETER LEFT GROIN;  Surgeon: Leonie Douglas, MD;  Location: [facility] OR;  Service: Vascular;  Laterality: Left;   IR FLUORO GUIDE CV LINE LEFT  04/23/2022   IR FLUORO GUIDE CV LINE LEFT  01/29/2023   IR FLUORO GUIDE CV LINE LEFT  02/02/2023   IR FLUORO GUIDE CV LINE LEFT  04/02/2023   IR FLUORO GUIDE CV LINE RIGHT  05/11/2018   IR FLUORO GUIDE CV LINE RIGHT  07/03/2021   IR FLUORO GUIDE CV LINE RIGHT  07/16/2021   IR PTA ADDL CENTRAL DIALYSIS SEG THRU DIALY CIRCUIT RIGHT Right 07/03/2021   IR PTA AND STENT ADDL CENTRAL DIALY SEG THRU DIALY CIRCUIT LEFT Left 02/02/2023   IR US GUIDE VASC ACCESS LEFT  04/23/2022   IR US GUIDE VASC ACCESS RIGHT  05/11/2018   IR VENO/EXT/UNI LEFT  04/23/2022   IR VENO/EXT/UNI LEFT  01/29/2023   IR VENOCAVAGRAM IVC  07/16/2021   IR VENOCAVAGRAM IVC  02/02/2023   KIDNEY TRANSPLANT  2014   KNEE ARTHROSCOPY Left    LAPAROSCOPIC LYSIS OF ADHESIONS N/A 06/13/2022   Procedure: LAPAROSCOPIC  LYSIS OF ADHESIONS;  Surgeon: Maeola Harman, MD;  Location: [facility] OR;  Service: Vascular;  Laterality: N/A;   POLYPECTOMY  05/20/2022   Procedure: POLYPECTOMY;  Surgeon: Shellia Cleverly, DO;  Location: [facility];  Service: Gastroenterology;;   THROMBECTOMY W/ EMBOLECTOMY Right 04/26/2021   Procedure: REMOVAL OF RIGHT THIGH ARTERIOVENOUS GORE-TEX GRAFT AND REPAIR OF RIGHT COMMON FEMORAL ARTERY;  Surgeon: Leonie Douglas, MD;  Location: [facility] OR;  Service: Vascular;  Laterality: Right;   UPPER EXTREMITY ANGIOGRAPHY Bilateral 05/13/2018   Procedure: UPPER EXTREMITY ANGIOGRAPHY - bilarteral;  Surgeon: Cephus Shelling, MD;  Location: [facility];  Service: Cardiovascular;  Laterality: Bilateral;    Allergies: Patient has no known allergies.  Medications: Prior to Admission medications   Medication Sig Start Date  End Date Taking? Authorizing Provider  apixaban (ELIQUIS) 5 MG TABS tablet Take 5 mg by mouth 2 (two) times daily. 10/10/21   [provider]  atorvastatin (LIPITOR) 40 MG tablet Take 1 tablet (40 mg total) by mouth daily. 02/20/23   [name], MD  Calcium Carb-Cholecalciferol (CALCIUM 600 + D PO) Take 1 tablet by mouth daily.    [provider]  cefTAZidime 2 g in dextrose 5 % 50 mL Inject 2 g into the vein Every Tuesday,Thursday,and Saturday with dialysis. TTS post HD until 11/17 04/07/23   [name], MD  ferric citrate (AURYXIA) 1 GM 210 MG(Fe) tablet Take 210 mg by mouth 3 (three) times daily with meals. 09/06/19   [provider]  midodrine (PROAMATINE) 5 MG tablet Take 15 mg by mouth 3 (three) times daily. 01/20/23   [provider]  Tenapanor HCl, CKD, (XPHOZAH) 30 MG TABS Take 30 mg by mouth in the morning and at bedtime.    [provider]     Family History  Problem Relation Age of Onset   Diabetes Father    Stroke Father    Stroke Maternal Grandmother    Anesthesia problems Neg Hx     Social History   Socioeconomic History   Marital status: Single    Spouse name: Not on file   Number of children: 0   Years of education: Not on file   Highest education level: Not on file  Occupational History   Not on file  Tobacco Use   Smoking status: Former    Current packs/day: 0.00    Types: Cigarettes    Quit date: 11/08/2015    Years since quitting: 7.4   Smokeless tobacco: Never  Vaping Use   Vaping status: Never Used  Substance and Sexual Activity   Alcohol use: Not Currently   Drug use: Not Currently    Types: Marijuana    Comment: Last use was in 04/2022   Sexual activity: Yes  Other Topics Concern   Not on file  Social History Narrative   Lives alone.    Social Determinants of Health   Financial Resource Strain: Low Risk  (12/29/2022)   Overall Financial Resource Strain (CARDIA)    Difficulty of Paying  Living Expenses: Not hard at all  Food Insecurity: No Food Insecurity (04/02/2023)   Hunger Vital Sign    Worried About Running Out of Food in the Last Year: Never true    Ran Out of Food in the Last Year: Never true  Transportation Needs: No Transportation Needs (04/02/2023)   PRAPARE - Administrator, Civil Service (Medical): No    Lack of Transportation (Non-Medical): No  Recent Concern: Transportation Needs - Unmet Transportation Needs (02/02/2023)   PRAPARE - Administrator, Civil Service (Medical): Yes    Lack of Transportation (Non-Medical): No  Physical Activity: Insufficiently Active (12/29/2022)   Exercise Vital Sign    Days of Exercise per Week: 3 days    Minutes of Exercise per Session: 30 min  Stress: No Stress Concern Present (12/29/2022)   [employer] of Occupational Health - Occupational Stress Questionnaire    Feeling of Stress : Not at all  Social Connections: Socially Isolated (12/29/2022)   Social Connection and Isolation Panel [NHANES]    Frequency of Communication with Friends and Family: More than three times a week    Frequency of Social Gatherings with Friends and Family: More than three times a week    Attends Religious Services: Never    Database administrator or Organizations: No    Attends Banker Meetings: Never    Marital Status: Never married       Review of Systems: A 12 point ROS discussed and pertinent positives are indicated in the HPI above.  All other systems are negative.  Review of Systems  Vital Signs: There were no vitals taken for this visit.    Physical Exam  Mallampati Score:     Imaging: ECHOCARDIOGRAM COMPLETE  Result Date: 04/03/2023    ECHOCARDIOGRAM REPORT   Patient Name:   [name] Date of Exam: 04/03/2023 Medical Rec #:  [id number]         Height:       74.0 in Accession #:    [id number]        Weight:       304.0 lb Date of Birth:  01-May-1976         BSA:          2.597  m Patient Age:    47 years          BP:           101/57 mmHg Patient Gender: M                 HR:           74 bpm. Exam Location:  Inpatient Procedure: 2D Echo, Color Doppler and Cardiac Doppler Indications:    Bacteremia  History:        Patient has prior history of Echocardiogram examinations, most                 recent 12/07/2022. CHF, Arrythmias:Atrial Fibrillation; Risk                 Factors:Hypertension, Diabetes, Dyslipidemia, ESRD and Sleep                 Apnea.  Sonographer:    Irving Burton Senior RDCS Referring Phys: [id number] Lorin Glass  Sonographer Comments: Suboptimal subcostal window. Technically difficult due to patient body habitus. IMPRESSIONS  1. Left ventricular ejection fraction, by estimation, is 60 to 65%. The left ventricle has normal function. The left ventricle has no regional wall motion abnormalities. Left ventricular diastolic parameters were normal.  2. Right ventricular systolic function is mildly reduced. The right ventricular size is normal. There is normal pulmonary artery systolic pressure. The estimated right ventricular systolic pressure is 22.5 mmHg.  3. The mitral valve is grossly normal. Trivial mitral valve regurgitation. No evidence of mitral stenosis.  4. The aortic valve was not well visualized. Aortic valve regurgitation is not visualized.  Aortic valve sclerosis is present, with no evidence of aortic valve stenosis.  5. The inferior vena cava is normal in size with greater than 50% respiratory variability, suggesting right atrial pressure of 3 mmHg. Comparison(s): A prior study was performed on 12/07/2022. No significant change from prior study. Conclusion(s)/Recommendation(s): No evidence of valvular vegetations on this transthoracic echocardiogram. Consider a transesophageal echocardiogram to exclude infective endocarditis if clinically indicated. FINDINGS  Left Ventricle: Left ventricular ejection fraction, by estimation, is 60 to 65%. The left ventricle has normal  function. The left ventricle has no regional wall motion abnormalities. The left ventricular internal cavity size was normal in size. There is  no left ventricular hypertrophy. Left ventricular diastolic parameters were normal. Normal left ventricular filling pressure. Right Ventricle: The right ventricular size is normal. No increase in right ventricular wall thickness. Right ventricular systolic function is mildly reduced. There is normal pulmonary artery systolic pressure. The tricuspid regurgitant velocity is 2.21 m/s, and with an assumed right atrial pressure of 3 mmHg, the estimated right ventricular systolic pressure is 22.5 mmHg. Left Atrium: Left atrial size was normal in size. Right Atrium: Right atrial size was normal in size. Pericardium: There is no evidence of pericardial effusion. Mitral Valve: The mitral valve is grossly normal. Mild mitral annular calcification. Trivial mitral valve regurgitation. No evidence of mitral valve stenosis. Tricuspid Valve: The tricuspid valve is grossly normal. Tricuspid valve regurgitation is mild . No evidence of tricuspid stenosis. Aortic Valve: The aortic valve was not well visualized. Aortic valve regurgitation is not visualized. Aortic valve sclerosis is present, with no evidence of aortic valve stenosis. Pulmonic Valve: The pulmonic valve was not well visualized. Pulmonic valve regurgitation is not visualized. Aorta: The aortic root and ascending aorta are structurally normal, with no evidence of dilitation. Venous: The inferior vena cava is normal in size with greater than 50% respiratory variability, suggesting right atrial pressure of 3 mmHg. IAS/Shunts: The interatrial septum was not well visualized.  LEFT VENTRICLE PLAX 2D LVIDd:         5.10 cm   Diastology LVIDs:         3.30 cm   LV e' medial:    9.17 cm/s LV PW:         0.90 cm   LV E/e' medial:  10.5 LV IVS:        0.90 cm   LV e' lateral:   13.10 cm/s LVOT diam:     2.20 cm   LV E/e' lateral: 7.4 LV SV:          81 LV SV Index:   31 LVOT Area:     3.80 cm  RIGHT VENTRICLE RV S prime:     8.08 cm/s TAPSE (M-mode): 1.6 cm LEFT ATRIUM             Index        RIGHT ATRIUM           Index LA diam:        3.80 cm 1.46 cm/m   RA Area:     14.80 cm LA Vol (A2C):   67.3 ml 25.92 ml/m  RA Volume:   34.80 ml  13.40 ml/m LA Vol (A4C):   78.3 ml 30.15 ml/m LA Biplane Vol: 75.8 ml 29.19 ml/m  AORTIC VALVE LVOT Vmax:   91.40 cm/s LVOT Vmean:  63.500 cm/s LVOT VTI:    0.214 m  AORTA Ao Root diam: 3.50 cm Ao Asc diam:  3.70 cm MITRAL  VALVE               TRICUSPID VALVE MV Area (PHT): 3.33 cm    TR Peak grad:   19.5 mmHg MV Decel Time: 228 msec    TR Vmax:        221.00 cm/s MV E velocity: 96.70 cm/s MV A velocity: 64.90 cm/s  SHUNTS MV E/A ratio:  1.49        Systemic VTI:  0.21 m                            Systemic Diam: 2.20 cm Sunit Tolia Electronically signed by Tessa Lerner Signature Date/Time: 04/03/2023/12:41:59 PM    Final    IR Fluoro Guide CV Line Left  Result Date: 04/02/2023 INDICATION: 46 year old male with bacteremia/sepsis, referred for possible left femoral cath line holiday or exchange for alternative site. The patient has previously been evaluated for possible transhepatic hemodialysis catheter, as he has no upper extremity, no right femoral, and limited left femoral access. IVC occlusion. Previously the patient required left common iliac vein recanalization/stenting to improve flow for the left iliac hemodialysis catheter. Plan is for exchange, as the current line cannot be removed. EXAM: IMAGE GUIDED EXCHANGE OF LEFT FEMORAL TUNNELED HEMODIALYSIS CATHETER MEDICATIONS: None ANESTHESIA/SEDATION: None FLUOROSCOPY TIME:  Fluoroscopy Time:  (5 mGy). COMPLICATIONS: None PROCEDURE: Informed written consent was obtained from the patient after a discussion of the risks, benefits, and alternatives to treatment. Questions regarding the procedure were encouraged and answered. The left inguinal region and  indwelling catheter were prepped with chlorhexidine in a sterile fashion, and a sterile drape was applied covering the operative field. Maximum barrier sterile technique with sterile gowns and gloves were used for the procedure. A timeout was performed prior to the initiation of the procedure. Once the patient is prepped and draped, 1% lidocaine was used for local anesthesia. Scout images were acquired. Stiff Glidewire was advanced through the catheter into the lower IVC. Retention suture was ligated, blunt dissection was used to free the catheter, and the catheter wrist exchanged on the Glidewire. A new 28 cm palindrome catheter was placed. Aspiration of the blue and red hub confirmed function. Hep-Lock was administered. Catheter was sutured in position.  Final image was stored. Dressings were applied. The patient tolerated the procedure well without immediate post procedural complication. IMPRESSION: Status post image guided exchange of a presumed infected left femoral tunneled hemodialysis catheter, as above with new 28 cm. Signed, Yvone Neu. Miachel Roux, RPVI [facility] Electronically Signed   By: Gilmer Mor D.O.   On: 04/02/2023 14:46   DG CHEST PORT 1 VIEW  Result Date: 04/02/2023 CLINICAL DATA:  Respiratory distress EXAM: PORTABLE CHEST 1 VIEW COMPARISON:  04/01/2023 FINDINGS: Cardiac shadow is within normal limits. Lungs are well aerated bilaterally. No focal infiltrate or effusion is seen. Patient is somewhat rotated to the left accentuating the mediastinal markings particularly in the azygous vein. The azygous may be somewhat prominent due to collateral flow seen on prior exams. No bony abnormality is noted. IMPRESSION: No acute abnormality noted. Prominent azygous vein related to collateral venous pathways. Electronically Signed   By: Alcide Clever M.D.   On: 04/02/2023 01:43   CT EXTREMITY LOWER LEFT WO CONTRAST  Result Date:  04/01/2023 CLINICAL DATA:  Soft tissue infection suspected. EXAM: CT OF THE LOWER LEFT EXTREMITY WITHOUT CONTRAST TECHNIQUE: Multidetector CT imaging of the lower left  extremity was performed according to the standard protocol. RADIATION DOSE REDUCTION: This exam was performed according to the departmental dose-optimization program which includes automated exposure control, adjustment of the mA and/or kV according to patient size and/or use of iterative reconstruction technique. COMPARISON:  None Available. FINDINGS: Bones/Joint/Cartilage No acute fracture or focal osseous lesion. There is a moderate-sized knee joint effusion. No significant hip joint effusion identified. There are moderate degenerative changes of the knee. Ligaments Suboptimally assessed by CT. Muscles and Tendons No intramuscular fluid collection, air or mass identified. Deep fascial planes appear preserved. Soft tissues Peripheral vascular calcifications are present. Left femoral catheter is present, partially imaged. No subcutaneous fluid collection or air identified. There is a calcified subcutaneous tract in the anterior thigh which may be related to prior procedure or prior catheter placement. Alternatively this may represent a calcified peripheral vessel. IMPRESSION: 1. No acute osseous abnormality. 2. Moderate-sized knee joint effusion. 3. No subcutaneous fluid collection or air identified. 4. Peripheral vascular disease. Electronically Signed   By: Darliss Cheney M.D.   On: 04/01/2023 22:58   DG Chest Port 1 View  Result Date: 04/01/2023 CLINICAL DATA:  Sepsis. EXAM: PORTABLE CHEST 1 VIEW COMPARISON:  January 27, 2023. FINDINGS: The heart size and mediastinal contours are within normal limits. Both lungs are clear. The visualized skeletal structures are unremarkable. IMPRESSION: No active disease. Electronically Signed   By: Lupita Raider M.D.   On: 04/01/2023 20:43    Labs:  CBC: Recent Labs    04/04/23 0436 04/04/23 2031  04/05/23 0331 04/06/23 0452  WBC 6.6 6.4 5.9 7.0  HGB 9.7* 9.5* 9.3* 9.8*  HCT 31.2* 30.8* 29.5* 31.7*  PLT 109* 128* 129* 175    COAGS: Recent Labs    01/27/23 1523 01/28/23 2054 02/02/23 0810 04/01/23 1700 04/02/23 0119 04/02/23 0418 04/03/23 0350  INR 1.6*  --  1.4* 1.9* 1.8*  --   --   APTT  --    < >  --  38* 38* 38* 33   < > = values in this interval not displayed.    BMP: Recent Labs    04/04/23 2031 04/05/23 0030 04/05/23 2147 04/06/23 0452  NA 136 134* 131* 133*  K 5.2* 4.1 5.0 4.7  CL 99 97* 97* 96*  CO2 23 23 23  21*  GLUCOSE 95 76 86 78  BUN 66* 47* 65* 66*  CALCIUM 7.9* 8.2* 8.0* 8.3*  CREATININE 12.30* 8.82* 11.55* 12.42*  GFRNONAA 5* 7* 5* 5*    LIVER FUNCTION TESTS: Recent Labs    12/31/22 1302 01/27/23 1114 01/28/23 0630 01/30/23 0722 04/01/23 1633 04/02/23 0119 04/04/23 2031 04/05/23 0030 04/05/23 2147 04/06/23 0452  BILITOT 0.7 0.8 1.2  --  0.7  --   --   --   --   --   AST 11* 50* 34  --  17  --   --   --   --   --   ALT 9 53* 46*  --  14  --   --   --   --   --   ALKPHOS 56 149* 120  --  68  --   --   --   --   --   PROT 7.5 7.5 7.2  --  7.2  --   --   --   --   --   ALBUMIN 3.6 2.9* 2.9*   < > 3.3*   < > 2.6* 2.9* 2.8* 2.8*   < > =  values in this interval not displayed.    TUMOR MARKERS: No results for input(s): "AFPTM", "CEA", "CA199", "CHROMGRNA" in the last 8760 hours.  Assessment and Plan:  Robert Dinsdale is a 47 yo gentleman with long standing need for hemodialysis and with diminutive sites of access at this point.    He has bilateral brachiocephalic vein occlusion, and he has known iliac and IVC occlusion.    His current access at the left tunneled femoral catheter is his only site left functioning, and at this point needs to be maintained at all cost, currently.   The only other method of catheter placement at this time would be trans-hepatic, though these are also not very durable, and tend to bleed.   I discussed  with him the possibility of venogram and attempt at iliac and IVC reconstruction.  We could alternatively consider a similar approach to his occluded brachiocephalic veins as well.  Anyway that we approach it, this will require general anesthesia and advanced techniques, even potentially transhepatic, IVUS, sharp recan, gunsight, etc.    After our discussion about this problem, and our intention to attempt recan, he returned to his pending appointment with [facility] at [facility], for potential HeRO graft placement.  He did not want to commit at this time for any IVC reconstruction.  I think it will be difficult to place a HeRO without some sort of recan, however, I think an office visit with [facility] will be useful to get more input for him.   We will hold off until he has this appointment and he calls [place] back.   Plan: - He would like to hold off on our recommendation to attempt IVC recan, in lieu of discussion with [facility] about HeRO -- and then call us back if needed. He has our office number.  - continue current care.  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   Visit type: Audio only (telephone). Audio (no video) only due to patient's lack of internet/smartphone capability. Alternative for in-person consultation at [name], [address] [place], [place], [place]. This visit type was conducted due to national recommendations for restrictions regarding the COVID-19 Pandemic (e.g. social distancing).  This format is felt to  be most appropriate for this patient at this time.  All issues noted in this document were discussed and addressed.    MDM: chronic disease with exacerbation, review of labs/history as above, moderate risk for treatment, CPT [phone]

## 2023-04-09 ENCOUNTER — Telehealth: Payer: Self-pay

## 2023-04-09 DIAGNOSIS — N186 End stage renal disease: Secondary | ICD-10-CM | POA: Diagnosis not present

## 2023-04-09 DIAGNOSIS — N2581 Secondary hyperparathyroidism of renal origin: Secondary | ICD-10-CM | POA: Diagnosis not present

## 2023-04-09 DIAGNOSIS — Z992 Dependence on renal dialysis: Secondary | ICD-10-CM | POA: Diagnosis not present

## 2023-04-09 DIAGNOSIS — N041 Nephrotic syndrome with focal and segmental glomerular lesions: Secondary | ICD-10-CM | POA: Diagnosis not present

## 2023-04-09 NOTE — Transitions of Care (Post Inpatient/ED Visit) (Signed)
04/09/2023  Name: [name] MRN: [id number] DOB: Nov 26, 1975  Today's TOC FU Call Status: Today's TOC FU Call Status:: Unsuccessful Call (3rd Attempt) Unsuccessful Call (3rd Attempt) Date: 04/09/23  Attempted to reach the patient regarding the most recent Inpatient/ED visit.  Follow Up Plan: No further outreach attempts will be made at this time. We have been unable to contact the patient.  Jodelle Gross RN, BSN, CCM RN Care Manager  Transitions of Care  [facility] - [facility]  [phone]

## 2023-04-10 ENCOUNTER — Telehealth: Payer: Self-pay

## 2023-04-10 NOTE — Patient Instructions (Signed)
Visit Information  Thank you for taking time to visit with me today. Please don't hesitate to contact me if I can be of assistance to you.   Following are the goals we discussed today:   Goals Addressed             This Visit's Progress    Health Maintenance       Care Coordination Interventions: Evaluation of current treatment plan related to ESRD management and patient's adherence to plan as established by provider Discussed plans with patient for ongoing care management follow up and provided patient with direct contact information for care management team  Patient reports doing okay.  He reports right foot wound healed now.  Recent visit to podiatrist.  He had recent hospitalization for sepsis infection to dialysis catheter. He is on antibiotic during dialysis. Discussed signs of infection. Patient to follow up with [facility] for HERO graft. Patient states he has not been able to get anybody.  Advised CM would try to contact.  He verbalized understanding. CM called to Dr. Will Bonnet @ [phone].  Spoke with receptionist. Patient contact information given.  Patient to be contacted by the scheduling department.   Telephone call back to patient to advise that Dr. Elmyra Ricks scheduling is supposed to call him. He verbalized understanding. Patient declined wanting the number directly to them.          Our next appointment is by telephone on 04/15/23 at 200 pm  Please call the care guide team at [phone] if you need to cancel or reschedule your appointment.   If you are experiencing a Mental Health or Behavioral Health Crisis or need someone to talk to, please call the Suicide and Crisis Lifeline: 988   Patient verbalizes understanding of instructions and care plan provided today and agrees to view in MyChart. Active MyChart status and patient understanding of how to access instructions and care plan via MyChart confirmed with patient.     The patient has been provided with contact  information for the care management team and has been advised to call with any health related questions or concerns.   Bary Leriche, RN, MSN [facility], [facility] Management Community Coordinator Direct Dial: [phone]  Fax: [phone] Website: [name]

## 2023-04-10 NOTE — Patient Outreach (Signed)
  Care Coordination   Follow Up Visit Note   04/10/2023 Name: [name] MRN: [id number] DOB: [birth year]  [name] is a 47 y.o. year old male who sees Rudd, Bertram Millard, MD for primary care. I spoke with  [name] by phone today.  What matters to the patients health and wellness today?  Getting HERO Graft    Goals Addressed             This Visit's Progress    Health Maintenance       Care Coordination Interventions: Evaluation of current treatment plan related to ESRD management and patient's adherence to plan as established by provider Discussed plans with patient for ongoing care management follow up and provided patient with direct contact information for care management team  Patient reports doing okay.  He reports right foot wound healed now.  Recent visit to podiatrist.  He had recent hospitalization for sepsis infection to dialysis catheter. He is on antibiotic during dialysis. Discussed signs of infection. Patient to follow up with [facility] for HERO graft. Patient states he has not been able to get anybody.  Advised CM would try to contact.  He verbalized understanding. CM called to Dr. Will Bonnet @ [phone].  Spoke with receptionist. Patient contact information given.  Patient to be contacted by the scheduling department.   Telephone call back to patient to advise that Dr. Elmyra Ricks scheduling is supposed to call him. He verbalized understanding. Patient declined wanting the number directly to them.          SDOH assessments and interventions completed:  Yes     Care Coordination Interventions:  Yes, provided   Follow up plan: Follow up call scheduled for 04/15/23    Encounter Outcome:  Patient Visit Completed   Justice Aguirre Idelle Jo, RN, MSN [place]  [facility], [facility] Management Community Coordinator Direct Dial: [phone]  Fax: [phone] Website: [name]

## 2023-04-11 DIAGNOSIS — N186 End stage renal disease: Secondary | ICD-10-CM | POA: Diagnosis not present

## 2023-04-11 DIAGNOSIS — Z992 Dependence on renal dialysis: Secondary | ICD-10-CM | POA: Diagnosis not present

## 2023-04-11 DIAGNOSIS — N2581 Secondary hyperparathyroidism of renal origin: Secondary | ICD-10-CM | POA: Diagnosis not present

## 2023-04-14 DIAGNOSIS — Z992 Dependence on renal dialysis: Secondary | ICD-10-CM | POA: Diagnosis not present

## 2023-04-14 DIAGNOSIS — N2581 Secondary hyperparathyroidism of renal origin: Secondary | ICD-10-CM | POA: Diagnosis not present

## 2023-04-14 DIAGNOSIS — N186 End stage renal disease: Secondary | ICD-10-CM | POA: Diagnosis not present

## 2023-04-15 ENCOUNTER — Ambulatory Visit: Payer: Medicare HMO

## 2023-04-15 NOTE — Progress Notes (Signed)
Patient was present for DM shoes / inserts etc. But has no HX of DM Patient did not want to pay oop for these items  Addison Bailey Cped, CFo, CFm

## 2023-04-16 DIAGNOSIS — N186 End stage renal disease: Secondary | ICD-10-CM | POA: Diagnosis not present

## 2023-04-16 DIAGNOSIS — Z992 Dependence on renal dialysis: Secondary | ICD-10-CM | POA: Diagnosis not present

## 2023-04-16 DIAGNOSIS — N2581 Secondary hyperparathyroidism of renal origin: Secondary | ICD-10-CM | POA: Diagnosis not present

## 2023-04-18 DIAGNOSIS — N186 End stage renal disease: Secondary | ICD-10-CM | POA: Diagnosis not present

## 2023-04-18 DIAGNOSIS — Z992 Dependence on renal dialysis: Secondary | ICD-10-CM | POA: Diagnosis not present

## 2023-04-18 DIAGNOSIS — N2581 Secondary hyperparathyroidism of renal origin: Secondary | ICD-10-CM | POA: Diagnosis not present

## 2023-04-20 ENCOUNTER — Ambulatory Visit: Payer: Self-pay

## 2023-04-20 NOTE — Patient Instructions (Signed)
Visit Information  Thank you for taking time to visit with me today. Please don't hesitate to contact me if I can be of assistance to you.   Following are the goals we discussed today:   Goals Addressed             This Visit's Progress    Health Maintenance       Care Coordination Interventions: Evaluation of current treatment plan related to ESRD management and patient's adherence to plan as established by provider Discussed plans with patient for ongoing care management follow up and provided patient with direct contact information for care management team  Patient reports doing okay.  He reports he has an appointment for the HERO graft on Wednesday. He states it is actual surgery for the first part then will be scheduled for the next part. No issues with right foot.  Discussed sepsis and infection. He verbalized understanding. No concerns.        Our next appointment is by telephone on 05/27/23 at 130 pm  Please call the care guide team at [phone] if you need to cancel or reschedule your appointment.   If you are experiencing a Mental Health or Behavioral Health Crisis or need someone to talk to, please call the Suicide and Crisis Lifeline: 988   Patient verbalizes understanding of instructions and care plan provided today and agrees to view in MyChart. Active MyChart status and patient understanding of how to access instructions and care plan via MyChart confirmed with patient.     The patient has been provided with contact information for the care management team and has been advised to call with any health related questions or concerns.   Bary Leriche, RN, MSN [facility], [facility] Management Community Coordinator Direct Dial: [phone]  Fax: [phone] Website: [name]

## 2023-04-20 NOTE — Patient Outreach (Signed)
  Care Coordination   Follow Up Visit Note   04/20/2023 Name: [name] MRN: [id number] DOB: Aug 12, 1975  [name] is a 47 y.o. year old male who sees Rudd, Bertram Millard, MD for primary care. I spoke with  [name] by phone today.  What matters to the patients health and wellness today?  Getting HERO graft    Goals Addressed             This Visit's Progress    Health Maintenance       Care Coordination Interventions: Evaluation of current treatment plan related to ESRD management and patient's adherence to plan as established by provider Discussed plans with patient for ongoing care management follow up and provided patient with direct contact information for care management team  Patient reports doing okay.  He reports he has an appointment for the HERO graft on Wednesday. He states it is actual surgery for the first part then will be scheduled for the next part. No issues with right foot.  Discussed sepsis and infection. He verbalized understanding. No concerns.        SDOH assessments and interventions completed:  Yes     Care Coordination Interventions:  Yes, provided   Follow up plan: Follow up call scheduled for December    Encounter Outcome:  Patient Visit Completed   Bary Leriche, RN, MSN [place]  [facility], [facility] Management Community Coordinator Direct Dial: [phone]  Fax: [phone] Website: [name]

## 2023-04-21 DIAGNOSIS — N2581 Secondary hyperparathyroidism of renal origin: Secondary | ICD-10-CM | POA: Diagnosis not present

## 2023-04-21 DIAGNOSIS — Z992 Dependence on renal dialysis: Secondary | ICD-10-CM | POA: Diagnosis not present

## 2023-04-21 DIAGNOSIS — N186 End stage renal disease: Secondary | ICD-10-CM | POA: Diagnosis not present

## 2023-04-22 DIAGNOSIS — N186 End stage renal disease: Secondary | ICD-10-CM | POA: Diagnosis not present

## 2023-04-22 DIAGNOSIS — Z7901 Long term (current) use of anticoagulants: Secondary | ICD-10-CM | POA: Diagnosis not present

## 2023-04-22 DIAGNOSIS — Z7902 Long term (current) use of antithrombotics/antiplatelets: Secondary | ICD-10-CM | POA: Diagnosis not present

## 2023-04-22 DIAGNOSIS — I82291 Chronic embolism and thrombosis of other thoracic veins: Secondary | ICD-10-CM | POA: Diagnosis not present

## 2023-04-22 DIAGNOSIS — I82221 Chronic embolism and thrombosis of inferior vena cava: Secondary | ICD-10-CM | POA: Diagnosis not present

## 2023-04-22 DIAGNOSIS — I82210 Acute embolism and thrombosis of superior vena cava: Secondary | ICD-10-CM | POA: Diagnosis not present

## 2023-04-22 DIAGNOSIS — Z992 Dependence on renal dialysis: Secondary | ICD-10-CM | POA: Diagnosis not present

## 2023-04-22 DIAGNOSIS — I82220 Acute embolism and thrombosis of inferior vena cava: Secondary | ICD-10-CM | POA: Diagnosis not present

## 2023-04-22 DIAGNOSIS — I82C21 Chronic embolism and thrombosis of right internal jugular vein: Secondary | ICD-10-CM | POA: Diagnosis not present

## 2023-04-22 DIAGNOSIS — I82211 Chronic embolism and thrombosis of superior vena cava: Secondary | ICD-10-CM | POA: Diagnosis not present

## 2023-04-23 DIAGNOSIS — N2581 Secondary hyperparathyroidism of renal origin: Secondary | ICD-10-CM | POA: Diagnosis not present

## 2023-04-23 DIAGNOSIS — Z992 Dependence on renal dialysis: Secondary | ICD-10-CM | POA: Diagnosis not present

## 2023-04-23 DIAGNOSIS — N186 End stage renal disease: Secondary | ICD-10-CM | POA: Diagnosis not present

## 2023-04-25 DIAGNOSIS — N2581 Secondary hyperparathyroidism of renal origin: Secondary | ICD-10-CM | POA: Diagnosis not present

## 2023-04-25 DIAGNOSIS — Z992 Dependence on renal dialysis: Secondary | ICD-10-CM | POA: Diagnosis not present

## 2023-04-25 DIAGNOSIS — N186 End stage renal disease: Secondary | ICD-10-CM | POA: Diagnosis not present

## 2023-04-28 DIAGNOSIS — N186 End stage renal disease: Secondary | ICD-10-CM | POA: Diagnosis not present

## 2023-04-28 DIAGNOSIS — Z992 Dependence on renal dialysis: Secondary | ICD-10-CM | POA: Diagnosis not present

## 2023-04-28 DIAGNOSIS — N2581 Secondary hyperparathyroidism of renal origin: Secondary | ICD-10-CM | POA: Diagnosis not present

## 2023-04-30 DIAGNOSIS — Z992 Dependence on renal dialysis: Secondary | ICD-10-CM | POA: Diagnosis not present

## 2023-04-30 DIAGNOSIS — N2581 Secondary hyperparathyroidism of renal origin: Secondary | ICD-10-CM | POA: Diagnosis not present

## 2023-04-30 DIAGNOSIS — N186 End stage renal disease: Secondary | ICD-10-CM | POA: Diagnosis not present

## 2023-05-02 DIAGNOSIS — N186 End stage renal disease: Secondary | ICD-10-CM | POA: Diagnosis not present

## 2023-05-02 DIAGNOSIS — N2581 Secondary hyperparathyroidism of renal origin: Secondary | ICD-10-CM | POA: Diagnosis not present

## 2023-05-02 DIAGNOSIS — Z992 Dependence on renal dialysis: Secondary | ICD-10-CM | POA: Diagnosis not present

## 2023-05-04 ENCOUNTER — Telehealth: Payer: Self-pay

## 2023-05-04 DIAGNOSIS — N2581 Secondary hyperparathyroidism of renal origin: Secondary | ICD-10-CM | POA: Diagnosis not present

## 2023-05-04 DIAGNOSIS — Z992 Dependence on renal dialysis: Secondary | ICD-10-CM | POA: Diagnosis not present

## 2023-05-04 DIAGNOSIS — N186 End stage renal disease: Secondary | ICD-10-CM | POA: Diagnosis not present

## 2023-05-04 NOTE — Patient Outreach (Signed)
Attempted to contact patient regarding care gaps. Left voicemail for patient to return my call at [phone].  Nicholes Rough, CMA Care Guide [facility]

## 2023-05-06 DIAGNOSIS — N186 End stage renal disease: Secondary | ICD-10-CM | POA: Diagnosis not present

## 2023-05-06 DIAGNOSIS — Z992 Dependence on renal dialysis: Secondary | ICD-10-CM | POA: Diagnosis not present

## 2023-05-06 DIAGNOSIS — N2581 Secondary hyperparathyroidism of renal origin: Secondary | ICD-10-CM | POA: Diagnosis not present

## 2023-05-09 DIAGNOSIS — N2581 Secondary hyperparathyroidism of renal origin: Secondary | ICD-10-CM | POA: Diagnosis not present

## 2023-05-09 DIAGNOSIS — N041 Nephrotic syndrome with focal and segmental glomerular lesions: Secondary | ICD-10-CM | POA: Diagnosis not present

## 2023-05-09 DIAGNOSIS — Z992 Dependence on renal dialysis: Secondary | ICD-10-CM | POA: Diagnosis not present

## 2023-05-09 DIAGNOSIS — N186 End stage renal disease: Secondary | ICD-10-CM | POA: Diagnosis not present

## 2023-05-12 DIAGNOSIS — Z992 Dependence on renal dialysis: Secondary | ICD-10-CM | POA: Diagnosis not present

## 2023-05-12 DIAGNOSIS — N186 End stage renal disease: Secondary | ICD-10-CM | POA: Diagnosis not present

## 2023-05-12 DIAGNOSIS — N2581 Secondary hyperparathyroidism of renal origin: Secondary | ICD-10-CM | POA: Diagnosis not present

## 2023-05-14 DIAGNOSIS — Z992 Dependence on renal dialysis: Secondary | ICD-10-CM | POA: Diagnosis not present

## 2023-05-14 DIAGNOSIS — N2581 Secondary hyperparathyroidism of renal origin: Secondary | ICD-10-CM | POA: Diagnosis not present

## 2023-05-14 DIAGNOSIS — N186 End stage renal disease: Secondary | ICD-10-CM | POA: Diagnosis not present

## 2023-05-16 DIAGNOSIS — N2581 Secondary hyperparathyroidism of renal origin: Secondary | ICD-10-CM | POA: Diagnosis not present

## 2023-05-16 DIAGNOSIS — Z992 Dependence on renal dialysis: Secondary | ICD-10-CM | POA: Diagnosis not present

## 2023-05-16 DIAGNOSIS — N186 End stage renal disease: Secondary | ICD-10-CM | POA: Diagnosis not present

## 2023-05-19 DIAGNOSIS — N2581 Secondary hyperparathyroidism of renal origin: Secondary | ICD-10-CM | POA: Diagnosis not present

## 2023-05-19 DIAGNOSIS — Z992 Dependence on renal dialysis: Secondary | ICD-10-CM | POA: Diagnosis not present

## 2023-05-19 DIAGNOSIS — N186 End stage renal disease: Secondary | ICD-10-CM | POA: Diagnosis not present

## 2023-05-20 DIAGNOSIS — N186 End stage renal disease: Secondary | ICD-10-CM | POA: Diagnosis not present

## 2023-05-20 DIAGNOSIS — I132 Hypertensive heart and chronic kidney disease with heart failure and with stage 5 chronic kidney disease, or end stage renal disease: Secondary | ICD-10-CM | POA: Diagnosis not present

## 2023-05-20 DIAGNOSIS — E1122 Type 2 diabetes mellitus with diabetic chronic kidney disease: Secondary | ICD-10-CM | POA: Diagnosis not present

## 2023-05-20 DIAGNOSIS — I82210 Acute embolism and thrombosis of superior vena cava: Secondary | ICD-10-CM | POA: Diagnosis not present

## 2023-05-20 DIAGNOSIS — I4891 Unspecified atrial fibrillation: Secondary | ICD-10-CM | POA: Diagnosis not present

## 2023-05-20 DIAGNOSIS — Z6839 Body mass index (BMI) 39.0-39.9, adult: Secondary | ICD-10-CM | POA: Diagnosis not present

## 2023-05-20 DIAGNOSIS — Z992 Dependence on renal dialysis: Secondary | ICD-10-CM | POA: Diagnosis not present

## 2023-05-20 DIAGNOSIS — I82291 Chronic embolism and thrombosis of other thoracic veins: Secondary | ICD-10-CM | POA: Diagnosis not present

## 2023-05-20 DIAGNOSIS — I82C22 Chronic embolism and thrombosis of left internal jugular vein: Secondary | ICD-10-CM | POA: Diagnosis not present

## 2023-05-20 DIAGNOSIS — Z452 Encounter for adjustment and management of vascular access device: Secondary | ICD-10-CM | POA: Diagnosis not present

## 2023-05-20 DIAGNOSIS — I82602 Acute embolism and thrombosis of unspecified veins of left upper extremity: Secondary | ICD-10-CM | POA: Diagnosis not present

## 2023-05-20 DIAGNOSIS — Z940 Kidney transplant status: Secondary | ICD-10-CM | POA: Diagnosis not present

## 2023-05-20 DIAGNOSIS — I82290 Acute embolism and thrombosis of other thoracic veins: Secondary | ICD-10-CM | POA: Diagnosis not present

## 2023-05-21 DIAGNOSIS — N186 End stage renal disease: Secondary | ICD-10-CM | POA: Diagnosis not present

## 2023-05-22 DIAGNOSIS — N186 End stage renal disease: Secondary | ICD-10-CM | POA: Diagnosis not present

## 2023-05-22 DIAGNOSIS — I82602 Acute embolism and thrombosis of unspecified veins of left upper extremity: Secondary | ICD-10-CM | POA: Diagnosis not present

## 2023-05-25 ENCOUNTER — Telehealth: Payer: Self-pay

## 2023-05-25 NOTE — Transitions of Care (Post Inpatient/ED Visit) (Signed)
05/25/2023  Name: [name] MRN: [id number] DOB: 27-Jul-1975  Today's TOC FU Call Status: Today's TOC FU Call Status:: Successful TOC FU Call Completed TOC FU Call Complete Date: 05/25/23 Patient's Name and Date of Birth confirmed.  Transition Care Management Follow-up Telephone Call Date of Discharge: 05/24/23 Discharge Facility[facility] ([facility]) [facility] Facility: [facility] Type of Discharge: Inpatient Admission Primary Inpatient Discharge Diagnosis:: HERO dialysis access graft How have you been since you were released from the hospital?: Better ("I am just a little sore in my left arm") Any questions or concerns?: No  Items Reviewed: Did you receive and understand the discharge instructions provided?: Yes Medications obtained,verified, and reconciled?: Yes (Medications Reviewed) Any new allergies since your discharge?: No Dietary orders reviewed?: Yes Type of Diet Ordered:: Renal Do you have support at home?: Yes People in Home: parent(s), sibling(s)  Medications Reviewed Today: Medications Reviewed Today     Reviewed by Robert Mood, RN (Case Manager) on 05/25/23 at 1514  Med List Status: <None>   Medication Order Taking? Sig Documenting Provider Last Dose Status Informant  apixaban (ELIQUIS) 5 MG TABS tablet [id number] No Take 5 mg by mouth 2 (two) times daily.  Patient not taking: Reported on 05/25/2023   [provider] Not Taking Active Self  atorvastatin (LIPITOR) 40 MG tablet [id number] Yes Take 1 tablet (40 mg total) by mouth daily. Robert Mast, MD Taking Active Self  Calcium Carb-Cholecalciferol (CALCIUM 600 + D PO) [id number] Yes Take 1 tablet by mouth daily. [provider] Taking Active Self  cefTAZidime 2 g in dextrose 5 % 50 mL [id number] No Inject 2 g into the vein Every Tuesday,Thursday,and Saturday with dialysis. TTS post HD until 11/17  Patient not taking: Reported on 05/25/2023    Robert Mura, MD Not Taking Active   clopidogrel (PLAVIX) 75 MG tablet [id number] Yes Take 75 mg by mouth daily. [provider] Taking Active   ferric citrate (AURYXIA) 1 GM 210 MG(Fe) tablet [id number] Yes Take 210 mg by mouth 3 (three) times daily with meals. [provider] Taking Active Self           Med Note [name], [name]   Wed Dec 31, 2022  4:33 PM)    midodrine (PROAMATINE) 5 MG tablet [id number] No Take 15 mg by mouth 3 (three) times daily.  Patient not taking: Reported on 05/25/2023   [provider] Not Taking Active Self  Tenapanor HCl, CKD, (XPHOZAH) 30 MG TABS [id number]  Take 30 mg by mouth in the morning and at bedtime. [provider]  Active Self  Vancomycin HCl in NaCl 1.25-0.9 GM/250ML-% SOLN [id number] Yes Inject into the vein. [provider] Taking Active            Med Note [name] May 25, 2023  3:13 PM) 12/16 To be administered at hemodialysis            Home Care and Equipment/Supplies: Were Home Health Services Ordered?: No Any new equipment or medical supplies ordered?: No  Functional Questionnaire: Do you need assistance with bathing/showering or dressing?: No Do you need assistance with meal preparation?: No Do you need assistance with eating?: No Do you have difficulty maintaining continence: No Do you need assistance with getting out of bed/getting out of a chair/moving?: No Do you have difficulty managing or taking your medications?: No  Follow up appointments reviewed: PCP Follow-up appointment confirmed?: No (Patient will schedule himself.) MD  Provider Line [phone] Given: No Specialist Hospital Follow-up appointment confirmed?: Yes Date of Specialist follow-up appointment?: 06/22/23 Follow-Up Specialty Provider:: Vascular Surgeon, Continue IV Vanc at dialysis on 05/26/23. Do you need transportation to your follow-up appointment?: No Do you understand care options if  your condition(s) worsen?: Yes-patient verbalized understanding  SDOH Interventions Today    Flowsheet Row Most Recent Value  SDOH Interventions   Food Insecurity Interventions Intervention Not Indicated  Housing Interventions Intervention Not Indicated  Transportation Interventions Intervention Not Indicated  Utilities Interventions Intervention Not Indicated     TOC interventions discussed/reviewed:  Reviewed importance of keeping upcoming scheduled appointments. Please schedule your Primary Care Physician appointment in next 7 - 10 days.  Patient verbal education was provided and verbalized understanding of  Hospital discharge summary instructions including post-discharge diet and activity orders:  Activity:  No lifting greater than 10 lbs until you are seen in the clinic again; no soaking or tub baths; reviewed home medications including IV antibiotics will be given at the dialysis, condition-specific self-management and  when to notify provider for questions, concerns, or changes in condition. Advised that his Complex Case Manager, [name] is scheduled to call 05/27/2023 and will resume your case management services.   [name] Daphine Deutscher BSN, RN RN Care Manager   Transitions of Care [facility] - [facility] Direct Dial Number:  [phone]

## 2023-05-26 DIAGNOSIS — N2581 Secondary hyperparathyroidism of renal origin: Secondary | ICD-10-CM | POA: Diagnosis not present

## 2023-05-26 DIAGNOSIS — Z992 Dependence on renal dialysis: Secondary | ICD-10-CM | POA: Diagnosis not present

## 2023-05-26 DIAGNOSIS — N186 End stage renal disease: Secondary | ICD-10-CM | POA: Diagnosis not present

## 2023-05-27 ENCOUNTER — Ambulatory Visit: Payer: Self-pay

## 2023-05-27 NOTE — Patient Outreach (Signed)
  Care Coordination   Follow Up Visit Note   05/27/2023 Name: [name] MRN: [id number] DOB: 04-Aug-1975  [name] is a 47 y.o. year old male who sees Rudd, Bertram Millard, MD for primary care. I spoke with  Robert Lennert Janney by phone today.  What matters to the patients health and wellness today?  No issues today    Goals Addressed             This Visit's Progress    Health Maintenance       Care Coordination Interventions: Evaluation of current treatment plan related to ESRD management and patient's adherence to plan as established by provider Discussed plans with patient for ongoing care management follow up and provided patient with direct contact information for care management team  Patient doing okay. Recent Hero graft placement to left arm.  Not currently using site as it takes two weeks for healing.  No signs of infection.  Follow up for 4 weeks.  He reports currently active access is working fine right now.  No RN CM concerns.          SDOH assessments and interventions completed:  Yes     Care Coordination Interventions:  Yes, provided   Follow up plan: Follow up call scheduled for January    Encounter Outcome:  Patient Visit Completed   Bary Leriche, RN, MSN RN Care Manager [facility], Population Health Direct Dial: [phone]  Fax: [phone] Website: [name]

## 2023-05-27 NOTE — Patient Instructions (Signed)
Visit Information  Thank you for taking time to visit with me today. Please don't hesitate to contact me if I can be of assistance to you.   Following are the goals we discussed today:   Goals Addressed             This Visit's Progress    Health Maintenance       Care Coordination Interventions: Evaluation of current treatment plan related to ESRD management and patient's adherence to plan as established by provider Discussed plans with patient for ongoing care management follow up and provided patient with direct contact information for care management team  Patient doing okay. Recent Hero graft placement to left arm.  Not currently using site as it takes two weeks for healing.  No signs of infection.  Follow up for 4 weeks.  He reports currently active access is working fine right now.  No RN CM concerns.          Our next appointment is by telephone on 07/08/23 at 130 pm  Please call the care guide team at [phone] if you need to cancel or reschedule your appointment.   If you are experiencing a Mental Health or Behavioral Health Crisis or need someone to talk to, please call the Suicide and Crisis Lifeline: 988   Patient verbalizes understanding of instructions and care plan provided today and agrees to view in MyChart. Active MyChart status and patient understanding of how to access instructions and care plan via MyChart confirmed with patient.     The patient has been provided with contact information for the care management team and has been advised to call with any health related questions or concerns.   Bary Leriche, RN, MSN RN Care Manager [facility], Population Health Direct Dial: [phone]  Fax: [phone] Website: [name]

## 2023-05-28 DIAGNOSIS — Z992 Dependence on renal dialysis: Secondary | ICD-10-CM | POA: Diagnosis not present

## 2023-05-28 DIAGNOSIS — N2581 Secondary hyperparathyroidism of renal origin: Secondary | ICD-10-CM | POA: Diagnosis not present

## 2023-05-28 DIAGNOSIS — N186 End stage renal disease: Secondary | ICD-10-CM | POA: Diagnosis not present

## 2023-05-30 DIAGNOSIS — N186 End stage renal disease: Secondary | ICD-10-CM | POA: Diagnosis not present

## 2023-05-30 DIAGNOSIS — N2581 Secondary hyperparathyroidism of renal origin: Secondary | ICD-10-CM | POA: Diagnosis not present

## 2023-05-30 DIAGNOSIS — Z992 Dependence on renal dialysis: Secondary | ICD-10-CM | POA: Diagnosis not present

## 2023-06-01 DIAGNOSIS — Z992 Dependence on renal dialysis: Secondary | ICD-10-CM | POA: Diagnosis not present

## 2023-06-01 DIAGNOSIS — N186 End stage renal disease: Secondary | ICD-10-CM | POA: Diagnosis not present

## 2023-06-01 DIAGNOSIS — N2581 Secondary hyperparathyroidism of renal origin: Secondary | ICD-10-CM | POA: Diagnosis not present

## 2023-06-04 DIAGNOSIS — Z992 Dependence on renal dialysis: Secondary | ICD-10-CM | POA: Diagnosis not present

## 2023-06-04 DIAGNOSIS — N2581 Secondary hyperparathyroidism of renal origin: Secondary | ICD-10-CM | POA: Diagnosis not present

## 2023-06-04 DIAGNOSIS — N186 End stage renal disease: Secondary | ICD-10-CM | POA: Diagnosis not present

## 2023-06-06 DIAGNOSIS — Z992 Dependence on renal dialysis: Secondary | ICD-10-CM | POA: Diagnosis not present

## 2023-06-06 DIAGNOSIS — N2581 Secondary hyperparathyroidism of renal origin: Secondary | ICD-10-CM | POA: Diagnosis not present

## 2023-06-06 DIAGNOSIS — N186 End stage renal disease: Secondary | ICD-10-CM | POA: Diagnosis not present

## 2023-06-08 DIAGNOSIS — Z992 Dependence on renal dialysis: Secondary | ICD-10-CM | POA: Diagnosis not present

## 2023-06-08 DIAGNOSIS — N186 End stage renal disease: Secondary | ICD-10-CM | POA: Diagnosis not present

## 2023-06-08 DIAGNOSIS — N2581 Secondary hyperparathyroidism of renal origin: Secondary | ICD-10-CM | POA: Diagnosis not present

## 2023-06-09 DIAGNOSIS — N186 End stage renal disease: Secondary | ICD-10-CM | POA: Diagnosis not present

## 2023-06-09 DIAGNOSIS — Z992 Dependence on renal dialysis: Secondary | ICD-10-CM | POA: Diagnosis not present

## 2023-06-09 DIAGNOSIS — N041 Nephrotic syndrome with focal and segmental glomerular lesions: Secondary | ICD-10-CM | POA: Diagnosis not present

## 2023-06-11 DIAGNOSIS — Z992 Dependence on renal dialysis: Secondary | ICD-10-CM | POA: Diagnosis not present

## 2023-06-11 DIAGNOSIS — N186 End stage renal disease: Secondary | ICD-10-CM | POA: Diagnosis not present

## 2023-06-11 DIAGNOSIS — N2581 Secondary hyperparathyroidism of renal origin: Secondary | ICD-10-CM | POA: Diagnosis not present

## 2023-06-13 DIAGNOSIS — N186 End stage renal disease: Secondary | ICD-10-CM | POA: Diagnosis not present

## 2023-06-13 DIAGNOSIS — Z992 Dependence on renal dialysis: Secondary | ICD-10-CM | POA: Diagnosis not present

## 2023-06-13 DIAGNOSIS — N2581 Secondary hyperparathyroidism of renal origin: Secondary | ICD-10-CM | POA: Diagnosis not present

## 2023-06-16 DIAGNOSIS — N2581 Secondary hyperparathyroidism of renal origin: Secondary | ICD-10-CM | POA: Diagnosis not present

## 2023-06-16 DIAGNOSIS — N186 End stage renal disease: Secondary | ICD-10-CM | POA: Diagnosis not present

## 2023-06-16 DIAGNOSIS — Z992 Dependence on renal dialysis: Secondary | ICD-10-CM | POA: Diagnosis not present

## 2023-06-17 DIAGNOSIS — N186 End stage renal disease: Secondary | ICD-10-CM | POA: Diagnosis not present

## 2023-06-17 DIAGNOSIS — E039 Hypothyroidism, unspecified: Secondary | ICD-10-CM | POA: Diagnosis not present

## 2023-06-17 DIAGNOSIS — D649 Anemia, unspecified: Secondary | ICD-10-CM | POA: Diagnosis not present

## 2023-06-17 DIAGNOSIS — I959 Hypotension, unspecified: Secondary | ICD-10-CM | POA: Diagnosis not present

## 2023-06-17 DIAGNOSIS — T8612 Kidney transplant failure: Secondary | ICD-10-CM | POA: Diagnosis not present

## 2023-06-17 DIAGNOSIS — I480 Paroxysmal atrial fibrillation: Secondary | ICD-10-CM | POA: Diagnosis not present

## 2023-06-17 DIAGNOSIS — T82868A Thrombosis of vascular prosthetic devices, implants and grafts, initial encounter: Secondary | ICD-10-CM | POA: Diagnosis not present

## 2023-06-17 DIAGNOSIS — K219 Gastro-esophageal reflux disease without esophagitis: Secondary | ICD-10-CM | POA: Diagnosis not present

## 2023-06-17 DIAGNOSIS — I5032 Chronic diastolic (congestive) heart failure: Secondary | ICD-10-CM | POA: Diagnosis not present

## 2023-06-17 DIAGNOSIS — Z6841 Body Mass Index (BMI) 40.0 and over, adult: Secondary | ICD-10-CM | POA: Diagnosis not present

## 2023-06-17 DIAGNOSIS — G4733 Obstructive sleep apnea (adult) (pediatric): Secondary | ICD-10-CM | POA: Diagnosis not present

## 2023-06-17 DIAGNOSIS — E119 Type 2 diabetes mellitus without complications: Secondary | ICD-10-CM | POA: Diagnosis not present

## 2023-06-17 DIAGNOSIS — I132 Hypertensive heart and chronic kidney disease with heart failure and with stage 5 chronic kidney disease, or end stage renal disease: Secondary | ICD-10-CM | POA: Diagnosis not present

## 2023-06-17 DIAGNOSIS — E1122 Type 2 diabetes mellitus with diabetic chronic kidney disease: Secondary | ICD-10-CM | POA: Diagnosis not present

## 2023-06-17 DIAGNOSIS — T82898A Other specified complication of vascular prosthetic devices, implants and grafts, initial encounter: Secondary | ICD-10-CM | POA: Diagnosis not present

## 2023-06-17 DIAGNOSIS — Z992 Dependence on renal dialysis: Secondary | ICD-10-CM | POA: Diagnosis not present

## 2023-06-17 DIAGNOSIS — D631 Anemia in chronic kidney disease: Secondary | ICD-10-CM | POA: Diagnosis not present

## 2023-06-18 DIAGNOSIS — E1122 Type 2 diabetes mellitus with diabetic chronic kidney disease: Secondary | ICD-10-CM | POA: Diagnosis not present

## 2023-06-18 DIAGNOSIS — N186 End stage renal disease: Secondary | ICD-10-CM | POA: Diagnosis not present

## 2023-06-18 DIAGNOSIS — I5032 Chronic diastolic (congestive) heart failure: Secondary | ICD-10-CM | POA: Diagnosis not present

## 2023-06-18 DIAGNOSIS — E039 Hypothyroidism, unspecified: Secondary | ICD-10-CM | POA: Diagnosis not present

## 2023-06-18 DIAGNOSIS — T82868A Thrombosis of vascular prosthetic devices, implants and grafts, initial encounter: Secondary | ICD-10-CM | POA: Diagnosis not present

## 2023-06-18 DIAGNOSIS — T8612 Kidney transplant failure: Secondary | ICD-10-CM | POA: Diagnosis not present

## 2023-06-18 DIAGNOSIS — Z6841 Body Mass Index (BMI) 40.0 and over, adult: Secondary | ICD-10-CM | POA: Diagnosis not present

## 2023-06-18 DIAGNOSIS — D631 Anemia in chronic kidney disease: Secondary | ICD-10-CM | POA: Diagnosis not present

## 2023-06-18 DIAGNOSIS — I132 Hypertensive heart and chronic kidney disease with heart failure and with stage 5 chronic kidney disease, or end stage renal disease: Secondary | ICD-10-CM | POA: Diagnosis not present

## 2023-06-20 DIAGNOSIS — N186 End stage renal disease: Secondary | ICD-10-CM | POA: Diagnosis not present

## 2023-06-20 DIAGNOSIS — N2581 Secondary hyperparathyroidism of renal origin: Secondary | ICD-10-CM | POA: Diagnosis not present

## 2023-06-20 DIAGNOSIS — Z992 Dependence on renal dialysis: Secondary | ICD-10-CM | POA: Diagnosis not present

## 2023-06-23 ENCOUNTER — Telehealth: Payer: Self-pay

## 2023-06-23 DIAGNOSIS — T82868A Thrombosis of vascular prosthetic devices, implants and grafts, initial encounter: Secondary | ICD-10-CM

## 2023-06-23 DIAGNOSIS — N186 End stage renal disease: Secondary | ICD-10-CM | POA: Diagnosis not present

## 2023-06-23 DIAGNOSIS — N2581 Secondary hyperparathyroidism of renal origin: Secondary | ICD-10-CM | POA: Diagnosis not present

## 2023-06-23 DIAGNOSIS — Z992 Dependence on renal dialysis: Secondary | ICD-10-CM | POA: Diagnosis not present

## 2023-06-23 DIAGNOSIS — T8619 Other complication of kidney transplant: Secondary | ICD-10-CM

## 2023-06-23 NOTE — Transitions of Care (Post Inpatient/ED Visit) (Signed)
 06/23/2023  Name: [name] MRN: [id number] DOB: 16-Jan-1976  Today's TOC FU Call Status: Today's TOC FU Call Status:: Successful TOC FU Call Completed TOC FU Call Complete Date: 06/23/23 Patient's Name and Date of Birth confirmed.  Transition Care Management Follow-up Telephone Call Date of Discharge: 06/18/23 Discharge Facility: [facility] ([facility]) [facility]charge Facility: [facility] Type of Discharge: Inpatient Admission Primary Inpatient Discharge Diagnosis:: Dialysis access clotted How have you been since you were released from the hospital?: Better (reports dialysis today, feels weak) Any questions or concerns?: No  Items Reviewed: Did you receive and understand the discharge instructions provided?: Yes Medications obtained,verified, and reconciled?: Yes (Medications Reviewed) Any new allergies since your discharge?: No Dietary orders reviewed?: Yes Type of Diet Ordered:: renal diet Do you have support at home?: Yes People in Home: alone Name of Support/Comfort Primary Source: friends and family  Medications Reviewed Today: Medications Reviewed Today     Reviewed by [name], RN (Registered Nurse) on 06/23/23 at 1450  Med List Status: <None>   Medication Order Taking? Sig Documenting Provider Last Dose Status Informant  apixaban  (ELIQUIS ) 5 MG TABS tablet [id number]  Take 2.5 mg by mouth 2 (two) times daily. [provider]  Active Self           Med Note (ROSE, Rosalind Guido U   Tue Jun 23, 2023  2:49 PM) Not taking because of expense. Last dose 05/21/2023  atorvastatin  (LIPITOR) 40 MG tablet [id number] Yes Take 1 tablet (40 mg total) by mouth daily. Thedora Garnette CHRISTELLA, MD Taking Active Self  Calcium  Carb-Cholecalciferol (CALCIUM  600 + D PO) [id number] Yes Take 1 tablet by mouth daily. [provider] Taking Active Self  cefTAZidime  2 g in dextrose  5 % 50 mL [id number] Yes Inject 2 g into the vein Every Tuesday,Thursday,and Saturday  with dialysis. TTS post HD until 11/17 Samtani, Jai-Gurmukh, MD Taking Active   clopidogrel  (PLAVIX ) 75 MG tablet [id number] Yes Take 75 mg by mouth daily. [provider] Taking Active   ferric citrate  (AURYXIA ) 1 GM 210 MG(Fe) tablet [id number] Yes Take 210 mg by mouth 3 (three) times daily with meals. [provider] Taking Active Self           Med Note DINO, [name]   Wed Dec 31, 2022  4:33 PM)    midodrine  (PROAMATINE ) 5 MG tablet [id number] No Take 15 mg by mouth 3 (three) times daily.  Patient not taking: Reported on 05/25/2023   [provider] Not Taking Active Self  Tenapanor HCl, CKD, (XPHOZAH) 30 MG TABS [id number] Yes Take 30 mg by mouth in the morning and at bedtime. [provider] Taking Active Self            Home Care and Equipment/Supplies: Were Home Health Services Ordered?: No Any new equipment or medical supplies ordered?: No  Functional Questionnaire: Do you need assistance with bathing/showering or dressing?: No Do you need assistance with meal preparation?: No Do you need assistance with eating?: No Do you have difficulty maintaining continence: No Do you need assistance with getting out of bed/getting out of a chair/moving?: No Do you have difficulty managing or taking your medications?: No (eliquis  is too expensive)  Follow up appointments reviewed: PCP Follow-up appointment confirmed?: No (reports he only follows up with nephrology and dialysis MD.) MD Provider Line [phone] Given: No Specialist Hospital Follow-up appointment confirmed?: Yes Date of Specialist follow-up appointment?: 07/01/23 Follow-Up Specialty Provider:: Surgeon at [facility] Do you  need transportation to your follow-up appointment?: No Do you understand care options if your condition(s) worsen?: Yes-patient verbalized understanding  SDOH Interventions Today    Flowsheet Row Most Recent Value  SDOH Interventions   Food Insecurity  Interventions Intervention Not Indicated  Housing Interventions Intervention Not Indicated  Transportation Interventions Intervention Not Indicated  Utilities Interventions Intervention Not Indicated      TOC Interventions Today    Flowsheet Row Most Recent Value  TOC Interventions   TOC Interventions Discussed/Reviewed TOC Interventions Discussed, TOC Interventions Reviewed      Reviewed with patient his discharge instructions.  Patient reports his only concern is that he is not able to afford his eliquis  and has not had his eliquis  since hospital discharge.   Referral place.  Offered 30 day TOC program and patient declined.  Provided my contact information for patient to call back if needed.  Alan Ee, RN, BSN, CEN [employer]- Transition of Care Team.  [facility] [phone]

## 2023-06-24 ENCOUNTER — Telehealth: Payer: Self-pay

## 2023-06-24 NOTE — Progress Notes (Signed)
 Care Guide Pharmacy Note  06/24/2023 Name: [name] MRN: [id number] DOB: 1975-08-16  Referred By: Graig Lawyer, MD Reason for referral: Care Coordination (Outreach to schedule with Pharm d )   Robert Pintos Heymann is a 48 y.o. year old male who is a primary care patient of Graig Lawyer, MD.  [name] was referred to the pharmacist for assistance related to:  med assistance   Successful contact was made with the patient to discuss pharmacy services including being ready for the pharmacist to call at least 5 minutes before the scheduled appointment time and to have medication bottles and any blood pressure readings ready for review. The patient agreed to meet with the pharmacist via telephone visit on (date/time).06/25/2023  Lenton Rail , RMA     [place]  [facility], [facility] Guide  Direct Dial : [phone]  Website: [email or web address]

## 2023-06-25 ENCOUNTER — Other Ambulatory Visit: Payer: Self-pay

## 2023-06-25 DIAGNOSIS — Z992 Dependence on renal dialysis: Secondary | ICD-10-CM | POA: Diagnosis not present

## 2023-06-25 DIAGNOSIS — N186 End stage renal disease: Secondary | ICD-10-CM | POA: Diagnosis not present

## 2023-06-25 DIAGNOSIS — N2581 Secondary hyperparathyroidism of renal origin: Secondary | ICD-10-CM | POA: Diagnosis not present

## 2023-06-25 MED ORDER — APIXABAN 5 MG PO TABS: 5.0000 mg | ORAL_TABLET | Freq: Two times a day (BID) | ORAL | 0 refills | Status: DC

## 2023-06-25 NOTE — Progress Notes (Signed)
   06/25/2023  Patient ID: [name], male   DOB: 10/30/1975, 48 y.o.   MRN: [id number]  Subjective/Objective Patient out reach in response to referral placed by patient's primary care provide,r Dr. Veto Kemps.    -Patient is currently prescribed Eliquis 5 mg twice daily for clot prevention, but he has not had this medication in over than a month due to cost.   -Patient is enrolled in [facility], but he states his co-pay for the medication is not affordable.   -Patient also endorses applying for Medicaid last November, but he was denied.    Assessment/Plan Recommended that patient reach out to his [facility] plan, as they will now work with patients to spread co-pays out over the course of 12 months.   -Patient states that he prefers to change to warfarin, as Eliquis will not be an affordable medication for him long-term.  -Patient has already contacted his provider at [facility] to inquire about a change of therapy; he understands dietary modifications will be needed as well as frequent blood work if changed to warfarin.  -I was able to obtain a free 30-day coupon for the patient, so he can take the medication until therapy is changed or he contact his insurance to work out a payment plan -Insurance underwriter and they do have 2 different prescriptions (2.5 mg and 5 mg) on file for the patient, but they state that this would not get delivered to him for another 7 to 10 days.  Contacting Dr. Veto Kemps to see if he is willing to send in a 30-day supply to [facility] pharmacy so patient can use 30-day trial coupon.  Follow-up: Patient has been provided with my direct phone number if future assistance is needed in regard to medication access or affordability.  Lenna Gilford, PharmD, DPLA

## 2023-06-26 NOTE — Progress Notes (Signed)
   06/26/2023  Patient ID: [name], male   DOB: 05/03/1976, 48 y.o.   MRN: [id number]  Dr. Veto Kemps sent a prescription for Eliquis into [facility] pharmacy.  I called the pharmacy and provided trial coupon processing information, and the prescription is now going through at $0.  Contacted the patient to make him aware, and he plans to pick this prescription up today.  Patient has a follow-up visit next Wednesday with his [facility] provider where he plans to discuss therapy moving forward.  Lenna Gilford, PharmD, DPLA

## 2023-06-27 DIAGNOSIS — N186 End stage renal disease: Secondary | ICD-10-CM | POA: Diagnosis not present

## 2023-06-27 DIAGNOSIS — Z992 Dependence on renal dialysis: Secondary | ICD-10-CM | POA: Diagnosis not present

## 2023-06-27 DIAGNOSIS — N2581 Secondary hyperparathyroidism of renal origin: Secondary | ICD-10-CM | POA: Diagnosis not present

## 2023-06-30 DIAGNOSIS — N186 End stage renal disease: Secondary | ICD-10-CM | POA: Diagnosis not present

## 2023-06-30 DIAGNOSIS — Z992 Dependence on renal dialysis: Secondary | ICD-10-CM | POA: Diagnosis not present

## 2023-06-30 DIAGNOSIS — N2581 Secondary hyperparathyroidism of renal origin: Secondary | ICD-10-CM | POA: Diagnosis not present

## 2023-07-01 ENCOUNTER — Telehealth: Payer: Self-pay

## 2023-07-01 NOTE — Patient Outreach (Signed)
  Care Coordination   07/01/2023 Name: [name] MRN: [id number] DOB: March 18, 1976   Care Coordination Outreach Attempts:  An unsuccessful telephone outreach was attempted today to offer the patient information about available complex care management services.  Follow Up Plan:  Additional outreach attempts will be made to offer the patient complex care management information and services.   Encounter Outcome:  No Answer   Care Coordination Interventions:  No, not indicated     Bary Leriche RN, MSN [facility]  [facility], [facility] RN Care Manager Direct Dial: [phone]  Fax: [phone] Website: [name]

## 2023-07-02 DIAGNOSIS — N186 End stage renal disease: Secondary | ICD-10-CM | POA: Diagnosis not present

## 2023-07-02 DIAGNOSIS — Z992 Dependence on renal dialysis: Secondary | ICD-10-CM | POA: Diagnosis not present

## 2023-07-02 DIAGNOSIS — N2581 Secondary hyperparathyroidism of renal origin: Secondary | ICD-10-CM | POA: Diagnosis not present

## 2023-07-03 ENCOUNTER — Telehealth: Payer: Self-pay

## 2023-07-03 NOTE — Patient Outreach (Signed)
  Care Coordination   07/03/2023 Name: [name] MRN: [id number] DOB: [birth year]   Care Coordination Outreach Attempts:  A second unsuccessful outreach was attempted today to offer the patient with information about available complex care management services.  Follow Up Plan:  Additional outreach attempts will be made to offer the patient complex care management information and services.   Encounter Outcome:  No Answer   Care Coordination Interventions:  No, not indicated    Bary Leriche RN, MSN [facility]  [facility], [facility] RN Care Manager Direct Dial: [phone]  Fax: [phone] Website: [name]

## 2023-07-04 DIAGNOSIS — N2581 Secondary hyperparathyroidism of renal origin: Secondary | ICD-10-CM | POA: Diagnosis not present

## 2023-07-04 DIAGNOSIS — Z992 Dependence on renal dialysis: Secondary | ICD-10-CM | POA: Diagnosis not present

## 2023-07-04 DIAGNOSIS — N186 End stage renal disease: Secondary | ICD-10-CM | POA: Diagnosis not present

## 2023-07-07 DIAGNOSIS — N186 End stage renal disease: Secondary | ICD-10-CM | POA: Diagnosis not present

## 2023-07-07 DIAGNOSIS — Z992 Dependence on renal dialysis: Secondary | ICD-10-CM | POA: Diagnosis not present

## 2023-07-07 DIAGNOSIS — N2581 Secondary hyperparathyroidism of renal origin: Secondary | ICD-10-CM | POA: Diagnosis not present

## 2023-07-09 DIAGNOSIS — N2581 Secondary hyperparathyroidism of renal origin: Secondary | ICD-10-CM | POA: Diagnosis not present

## 2023-07-09 DIAGNOSIS — N186 End stage renal disease: Secondary | ICD-10-CM | POA: Diagnosis not present

## 2023-07-09 DIAGNOSIS — Z992 Dependence on renal dialysis: Secondary | ICD-10-CM | POA: Diagnosis not present

## 2023-07-10 DIAGNOSIS — Z992 Dependence on renal dialysis: Secondary | ICD-10-CM | POA: Diagnosis not present

## 2023-07-10 DIAGNOSIS — N186 End stage renal disease: Secondary | ICD-10-CM | POA: Diagnosis not present

## 2023-07-10 DIAGNOSIS — N041 Nephrotic syndrome with focal and segmental glomerular lesions: Secondary | ICD-10-CM | POA: Diagnosis not present

## 2023-07-11 DIAGNOSIS — N2581 Secondary hyperparathyroidism of renal origin: Secondary | ICD-10-CM | POA: Diagnosis not present

## 2023-07-11 DIAGNOSIS — N186 End stage renal disease: Secondary | ICD-10-CM | POA: Diagnosis not present

## 2023-07-11 DIAGNOSIS — Z992 Dependence on renal dialysis: Secondary | ICD-10-CM | POA: Diagnosis not present

## 2023-07-13 DIAGNOSIS — Z992 Dependence on renal dialysis: Secondary | ICD-10-CM | POA: Diagnosis not present

## 2023-07-13 DIAGNOSIS — N186 End stage renal disease: Secondary | ICD-10-CM | POA: Diagnosis not present

## 2023-07-14 DIAGNOSIS — Z992 Dependence on renal dialysis: Secondary | ICD-10-CM | POA: Diagnosis not present

## 2023-07-14 DIAGNOSIS — N2581 Secondary hyperparathyroidism of renal origin: Secondary | ICD-10-CM | POA: Diagnosis not present

## 2023-07-14 DIAGNOSIS — N186 End stage renal disease: Secondary | ICD-10-CM | POA: Diagnosis not present

## 2023-07-16 DIAGNOSIS — N186 End stage renal disease: Secondary | ICD-10-CM | POA: Diagnosis not present

## 2023-07-16 DIAGNOSIS — N2581 Secondary hyperparathyroidism of renal origin: Secondary | ICD-10-CM | POA: Diagnosis not present

## 2023-07-16 DIAGNOSIS — Z992 Dependence on renal dialysis: Secondary | ICD-10-CM | POA: Diagnosis not present

## 2023-07-18 DIAGNOSIS — N186 End stage renal disease: Secondary | ICD-10-CM | POA: Diagnosis not present

## 2023-07-18 DIAGNOSIS — N2581 Secondary hyperparathyroidism of renal origin: Secondary | ICD-10-CM | POA: Diagnosis not present

## 2023-07-18 DIAGNOSIS — Z992 Dependence on renal dialysis: Secondary | ICD-10-CM | POA: Diagnosis not present

## 2023-07-21 DIAGNOSIS — N2581 Secondary hyperparathyroidism of renal origin: Secondary | ICD-10-CM | POA: Diagnosis not present

## 2023-07-21 DIAGNOSIS — N186 End stage renal disease: Secondary | ICD-10-CM | POA: Diagnosis not present

## 2023-07-21 DIAGNOSIS — Z992 Dependence on renal dialysis: Secondary | ICD-10-CM | POA: Diagnosis not present

## 2023-07-22 ENCOUNTER — Ambulatory Visit: Payer: Self-pay

## 2023-07-22 NOTE — Patient Outreach (Signed)
  Care Coordination   07/22/2023 Name: [name] MRN: [id number] DOB: [birth year]   Care Coordination Outreach Attempts:  A third unsuccessful outreach was attempted today to offer the patient with information about available complex care management services.  Follow Up Plan:  No further outreach attempts will be made at this time. We have been unable to contact the patient to offer or enroll patient in complex care management services.  Encounter Outcome:  No Answer   Care Coordination Interventions:  No, not indicated    Bary Leriche RN, MSN [facility]  [facility], [facility] RN Care Manager Direct Dial: [phone]  Fax: [phone] Website: [name]

## 2023-07-26 ENCOUNTER — Emergency Department (HOSPITAL_COMMUNITY): Payer: Medicare HMO

## 2023-07-26 ENCOUNTER — Other Ambulatory Visit: Payer: Self-pay

## 2023-07-26 ENCOUNTER — Inpatient Hospital Stay (HOSPITAL_COMMUNITY)
Admission: EM | Admit: 2023-07-26 | Discharge: 2023-07-27 | DRG: 252 | Discharge status: Against Medical Advice | Payer: Medicare HMO | Attending: Internal Medicine | Admitting: Internal Medicine

## 2023-07-26 ENCOUNTER — Encounter (HOSPITAL_COMMUNITY): Payer: Self-pay

## 2023-07-26 DIAGNOSIS — Z604 Social exclusion and rejection: Secondary | ICD-10-CM | POA: Diagnosis present

## 2023-07-26 DIAGNOSIS — E66812 Obesity, class 2: Secondary | ICD-10-CM | POA: Diagnosis present

## 2023-07-26 DIAGNOSIS — E1129 Type 2 diabetes mellitus with other diabetic kidney complication: Secondary | ICD-10-CM | POA: Diagnosis not present

## 2023-07-26 DIAGNOSIS — Z823 Family history of stroke: Secondary | ICD-10-CM | POA: Diagnosis not present

## 2023-07-26 DIAGNOSIS — Y832 Surgical operation with anastomosis, bypass or graft as the cause of abnormal reaction of the patient, or of later complication, without mention of misadventure at the time of the procedure: Secondary | ICD-10-CM | POA: Diagnosis present

## 2023-07-26 DIAGNOSIS — Z87891 Personal history of nicotine dependence: Secondary | ICD-10-CM

## 2023-07-26 DIAGNOSIS — Z7982 Long term (current) use of aspirin: Secondary | ICD-10-CM | POA: Diagnosis not present

## 2023-07-26 DIAGNOSIS — Z992 Dependence on renal dialysis: Secondary | ICD-10-CM | POA: Diagnosis not present

## 2023-07-26 DIAGNOSIS — I480 Paroxysmal atrial fibrillation: Secondary | ICD-10-CM | POA: Diagnosis present

## 2023-07-26 DIAGNOSIS — T8612 Kidney transplant failure: Secondary | ICD-10-CM | POA: Diagnosis present

## 2023-07-26 DIAGNOSIS — E1122 Type 2 diabetes mellitus with diabetic chronic kidney disease: Secondary | ICD-10-CM | POA: Diagnosis present

## 2023-07-26 DIAGNOSIS — Z7902 Long term (current) use of antithrombotics/antiplatelets: Secondary | ICD-10-CM | POA: Diagnosis not present

## 2023-07-26 DIAGNOSIS — Z833 Family history of diabetes mellitus: Secondary | ICD-10-CM

## 2023-07-26 DIAGNOSIS — Z452 Encounter for adjustment and management of vascular access device: Secondary | ICD-10-CM | POA: Diagnosis not present

## 2023-07-26 DIAGNOSIS — E875 Hyperkalemia: Secondary | ICD-10-CM | POA: Diagnosis present

## 2023-07-26 DIAGNOSIS — N186 End stage renal disease: Secondary | ICD-10-CM | POA: Diagnosis present

## 2023-07-26 DIAGNOSIS — T82868A Thrombosis of vascular prosthetic devices, implants and grafts, initial encounter: Principal | ICD-10-CM | POA: Diagnosis present

## 2023-07-26 DIAGNOSIS — N2581 Secondary hyperparathyroidism of renal origin: Secondary | ICD-10-CM | POA: Diagnosis present

## 2023-07-26 DIAGNOSIS — Z789 Other specified health status: Secondary | ICD-10-CM | POA: Diagnosis not present

## 2023-07-26 DIAGNOSIS — D631 Anemia in chronic kidney disease: Secondary | ICD-10-CM | POA: Diagnosis present

## 2023-07-26 DIAGNOSIS — Z4901 Encounter for fitting and adjustment of extracorporeal dialysis catheter: Secondary | ICD-10-CM | POA: Diagnosis not present

## 2023-07-26 DIAGNOSIS — I120 Hypertensive chronic kidney disease with stage 5 chronic kidney disease or end stage renal disease: Secondary | ICD-10-CM | POA: Diagnosis present

## 2023-07-26 DIAGNOSIS — I871 Compression of vein: Secondary | ICD-10-CM | POA: Diagnosis not present

## 2023-07-26 DIAGNOSIS — Y830 Surgical operation with transplant of whole organ as the cause of abnormal reaction of the patient, or of later complication, without mention of misadventure at the time of the procedure: Secondary | ICD-10-CM | POA: Diagnosis present

## 2023-07-26 DIAGNOSIS — T82598A Other mechanical complication of other cardiac and vascular devices and implants, initial encounter: Secondary | ICD-10-CM | POA: Diagnosis not present

## 2023-07-26 DIAGNOSIS — Z6837 Body mass index (BMI) 37.0-37.9, adult: Secondary | ICD-10-CM | POA: Diagnosis not present

## 2023-07-26 DIAGNOSIS — E7800 Pure hypercholesterolemia, unspecified: Secondary | ICD-10-CM | POA: Diagnosis not present

## 2023-07-26 DIAGNOSIS — I10 Essential (primary) hypertension: Secondary | ICD-10-CM | POA: Diagnosis not present

## 2023-07-26 DIAGNOSIS — Z91158 Patient's noncompliance with renal dialysis for other reason: Secondary | ICD-10-CM | POA: Diagnosis not present

## 2023-07-26 DIAGNOSIS — Z5329 Procedure and treatment not carried out because of patient's decision for other reasons: Secondary | ICD-10-CM | POA: Diagnosis present

## 2023-07-26 DIAGNOSIS — I959 Hypotension, unspecified: Secondary | ICD-10-CM | POA: Diagnosis present

## 2023-07-26 DIAGNOSIS — Z7901 Long term (current) use of anticoagulants: Secondary | ICD-10-CM

## 2023-07-26 DIAGNOSIS — Z940 Kidney transplant status: Secondary | ICD-10-CM

## 2023-07-26 DIAGNOSIS — E785 Hyperlipidemia, unspecified: Secondary | ICD-10-CM | POA: Diagnosis present

## 2023-07-26 DIAGNOSIS — G4733 Obstructive sleep apnea (adult) (pediatric): Secondary | ICD-10-CM | POA: Diagnosis present

## 2023-07-26 LAB — CBC WITH DIFFERENTIAL/PLATELET
Abs Immature Granulocytes: 0.01 10*3/uL (ref 0.00–0.07)
Basophils Absolute: 0 10*3/uL (ref 0.0–0.1)
Basophils Relative: 1 %
Eosinophils Absolute: 0.2 10*3/uL (ref 0.0–0.5)
Eosinophils Relative: 5 %
HCT: 35.5 % — ABNORMAL LOW (ref 39.0–52.0)
Hemoglobin: 11 g/dL — ABNORMAL LOW (ref 13.0–17.0)
Immature Granulocytes: 0 %
Lymphocytes Relative: 21 %
Lymphs Abs: 0.9 10*3/uL (ref 0.7–4.0)
MCH: 29.2 pg (ref 26.0–34.0)
MCHC: 31 g/dL (ref 30.0–36.0)
MCV: 94.2 fL (ref 80.0–100.0)
Monocytes Absolute: 0.5 10*3/uL (ref 0.1–1.0)
Monocytes Relative: 13 %
Neutro Abs: 2.4 10*3/uL (ref 1.7–7.7)
Neutrophils Relative %: 60 %
Platelets: 205 10*3/uL (ref 150–400)
RBC: 3.77 MIL/uL — ABNORMAL LOW (ref 4.22–5.81)
RDW: 17.6 % — ABNORMAL HIGH (ref 11.5–15.5)
WBC: 4.1 10*3/uL (ref 4.0–10.5)
nRBC: 0 % (ref 0.0–0.2)

## 2023-07-26 LAB — BASIC METABOLIC PANEL
Anion gap: 17 — ABNORMAL HIGH (ref 5–15)
BUN: 81 mg/dL — ABNORMAL HIGH (ref 6–20)
CO2: 23 mmol/L (ref 22–32)
Calcium: 9.7 mg/dL (ref 8.9–10.3)
Chloride: 99 mmol/L (ref 98–111)
Creatinine, Ser: 16.74 mg/dL — ABNORMAL HIGH (ref 0.61–1.24)
GFR, Estimated: 3 mL/min — ABNORMAL LOW (ref >60)
Glucose, Bld: 100 mg/dL — ABNORMAL HIGH (ref 70–99)
Potassium: 5.2 mmol/L — ABNORMAL HIGH (ref 3.5–5.1)
Sodium: 139 mmol/L (ref 135–145)

## 2023-07-26 LAB — ALBUMIN: Albumin: 3.6 g/dL (ref 3.5–5.0)

## 2023-07-26 LAB — PHOSPHORUS: Phosphorus: 5.4 mg/dL — ABNORMAL HIGH (ref 2.5–4.6)

## 2023-07-26 MED ORDER — CHLORHEXIDINE GLUCONATE CLOTH 2 % EX PADS
6.0000 | MEDICATED_PAD | Freq: Every day | CUTANEOUS | Status: DC
  Administered 2023-07-27: 6 via TOPICAL

## 2023-07-26 MED ORDER — MIDODRINE HCL 5 MG PO TABS
15.0000 mg | ORAL_TABLET | Freq: Three times a day (TID) | ORAL | Status: DC
  Administered 2023-07-27 (×3): 15 mg via ORAL
  Filled 2023-07-26 (×4): qty 3

## 2023-07-26 MED ORDER — ASPIRIN 81 MG PO TBEC
81.0000 mg | DELAYED_RELEASE_TABLET | Freq: Every day | ORAL | Status: DC
  Administered 2023-07-26 – 2023-07-27 (×2): 81 mg via ORAL
  Filled 2023-07-26 (×2): qty 1

## 2023-07-26 MED ORDER — CLOPIDOGREL BISULFATE 75 MG PO TABS
75.0000 mg | ORAL_TABLET | Freq: Every day | ORAL | Status: DC
  Administered 2023-07-26 – 2023-07-27 (×2): 75 mg via ORAL
  Filled 2023-07-26 (×2): qty 1

## 2023-07-26 MED ORDER — HYDRALAZINE HCL 20 MG/ML IJ SOLN: 2.0000 mg | Freq: Three times a day (TID) | INTRAMUSCULAR | Status: DC | PRN

## 2023-07-26 MED ORDER — FERRIC CITRATE 1 GM 210 MG(FE) PO TABS
210.0000 mg | ORAL_TABLET | Freq: Three times a day (TID) | ORAL | Status: DC
  Administered 2023-07-26 – 2023-07-27 (×4): 210 mg via ORAL
  Filled 2023-07-26 (×4): qty 1

## 2023-07-26 MED ORDER — ATORVASTATIN CALCIUM 40 MG PO TABS
40.0000 mg | ORAL_TABLET | Freq: Every day | ORAL | Status: DC
  Administered 2023-07-26 – 2023-07-27 (×2): 40 mg via ORAL
  Filled 2023-07-26 (×2): qty 1

## 2023-07-26 MED ORDER — ONDANSETRON HCL 4 MG PO TABS
4.0000 mg | ORAL_TABLET | Freq: Four times a day (QID) | ORAL | Status: DC | PRN
Start: 2023-07-26 — End: 2023-07-28

## 2023-07-26 MED ORDER — ONDANSETRON HCL 4 MG/2ML IJ SOLN: 4.0000 mg | Freq: Four times a day (QID) | INTRAMUSCULAR | Status: DC | PRN

## 2023-07-26 MED ORDER — SODIUM ZIRCONIUM CYCLOSILICATE 10 G PO PACK
10.0000 g | PACK | Freq: Once | ORAL | Status: AC
  Administered 2023-07-26: 10 g via ORAL
  Filled 2023-07-26: qty 1

## 2023-07-26 MED ORDER — SODIUM ZIRCONIUM CYCLOSILICATE 10 G PO PACK
10.0000 g | PACK | Freq: Every day | ORAL | Status: AC
  Administered 2023-07-26: 10 g via ORAL
  Filled 2023-07-26: qty 1

## 2023-07-26 MED ORDER — ACETAMINOPHEN 325 MG PO TABS: 650.0000 mg | ORAL_TABLET | Freq: Four times a day (QID) | ORAL | Status: DC | PRN

## 2023-07-26 MED ORDER — DOXERCALCIFEROL 4 MCG/2ML IV SOLN: 6.0000 ug | INTRAVENOUS | Status: DC

## 2023-07-26 MED ORDER — ACETAMINOPHEN 650 MG RE SUPP: 650.0000 mg | Freq: Four times a day (QID) | RECTAL | Status: DC | PRN

## 2023-07-26 NOTE — Assessment & Plan Note (Signed)
Continue lipitor 40mg  

## 2023-07-26 NOTE — Assessment & Plan Note (Signed)
 Continue midodrine

## 2023-07-26 NOTE — Assessment & Plan Note (Signed)
In NSR. On eliquis

## 2023-07-26 NOTE — ED Notes (Addendum)
Pt did not want vitals checked at this time. Pt said he is hoping to leave if they aren't able to help him.

## 2023-07-26 NOTE — Assessment & Plan Note (Addendum)
A1C of 5.2 in 02/2023  Carb modified diet

## 2023-07-26 NOTE — ED Provider Notes (Signed)
[facility] EMERGENCY DEPARTMENT AT [address] [facility] Provider Note   CSN: [id number] Arrival date & time: 07/26/23  1028     History Chief Complaint  Patient presents with   Vascular Access Problem    [name] is a 48 y.o. male with ESRD on dialysis TuThSat presents to the ER today for evaluation of fistula access. The patient has had multiple access issues with clots.  He takes his Eliquis twice daily and reports compliancy with it.  He recently had a hero graft placed by [facility] in early January.  Reports that he last had a full dialysis session on Tuesday.  Was told on Thursday that the graft had clotted I did not have access.  Was told to come to the emergency room and so he presented today.  Currently he has no complaints.  Denies any chest pain, shortness of breath, fatigue.  He denies any pain or swelling into the arm.  HPI     Home Medications Prior to Admission medications   Medication Sig Start Date End Date Taking? Authorizing Provider  apixaban (ELIQUIS) 5 MG TABS tablet Take 1 tablet (5 mg total) by mouth 2 (two) times daily. 06/25/23   [name], MD  atorvastatin (LIPITOR) 40 MG tablet Take 1 tablet (40 mg total) by mouth daily. 02/20/23   [name], MD  Calcium Carb-Cholecalciferol (CALCIUM 600 + D PO) Take 1 tablet by mouth daily.    [provider]  cefTAZidime 2 g in dextrose 5 % 50 mL Inject 2 g into the vein Every Tuesday,Thursday,and Saturday with dialysis. TTS post HD until 11/17 04/07/23   [name], MD  clopidogrel (PLAVIX) 75 MG tablet Take 75 mg by mouth daily.    [provider]  ferric citrate (AURYXIA) 1 GM 210 MG(Fe) tablet Take 210 mg by mouth 3 (three) times daily with meals. 09/06/19   [provider]  midodrine (PROAMATINE) 5 MG tablet Take 15 mg by mouth 3 (three) times daily. Patient not taking: Reported on 05/25/2023 01/20/23   [provider]  Tenapanor HCl, CKD, (XPHOZAH) 30 MG TABS  Take 30 mg by mouth in the morning and at bedtime.    [provider]      Allergies    Patient has no known allergies.    Review of Systems   Review of Systems  Constitutional:  Negative for chills and fever.  Respiratory:  Negative for shortness of breath.   Cardiovascular:  Negative for chest pain and leg swelling.  Gastrointestinal:  Negative for diarrhea, nausea and vomiting.    Physical Exam Updated Vital Signs BP (!) 129/92 (BP Location: Right Arm)   Pulse 79   Temp 98.1 F (36.7 C)   Resp 18   Ht 6\' 2"  (1.88 m)   Wt 132.6 kg   SpO2 100%   BMI 37.53 kg/m  Physical Exam Vitals and nursing note reviewed.  Constitutional:      General: He is not in acute distress.    Appearance: He is not toxic-appearing.  Eyes:     General: No scleral icterus. Cardiovascular:     Rate and Rhythm: Normal rate.  Pulmonary:     Effort: Pulmonary effort is normal. No respiratory distress.  Musculoskeletal:     Comments: There is no palpable thrill to the left upper extremity access site.  Surgical scars present.  No swelling.  Compartments are soft.  Denies any pain.  No obvious swelling.  No discoloration.  Skin:    General: Skin is warm and dry.  Neurological:     Mental Status: He is alert.     ED Results / Procedures / Treatments   Labs (all labs ordered are listed, but only abnormal results are displayed) Labs Reviewed  BASIC METABOLIC PANEL - Abnormal; Notable for the following components:      Result Value   Potassium 5.2 (*)    Glucose, Bld 100 (*)    BUN 81 (*)    Creatinine, Ser 16.74 (*)    GFR, Estimated 3 (*)    Anion gap 17 (*)    All other components within normal limits  CBC WITH DIFFERENTIAL/PLATELET - Abnormal; Notable for the following components:   RBC 3.77 (*)    Hemoglobin 11.0 (*)    HCT 35.5 (*)    RDW 17.6 (*)    All other components within normal limits    EKG EKG Interpretation Date/Time:  Sunday July 26 2023 11:16:11  EST Ventricular Rate:  74 PR Interval:  190 QRS Duration:  84 QT Interval:  384 QTC Calculation: 426 R Axis:   -25  Text Interpretation: Normal sinus rhythm Low voltage QRS Possible Anterolateral infarct , age undetermined Abnormal ECG When compared with ECG of 02-Apr-2023 01:28, PREVIOUS ECG IS PRESENT Confirmed by Kristine Royal [id number]) on 07/26/2023 11:26:14 AM  Radiology DG Chest 2 View Result Date: 07/26/2023 CLINICAL DATA:  missed dialysis. EXAM: CHEST - 2 VIEW COMPARISON:  04/02/2023. FINDINGS: Bilateral lung fields are clear. No pulmonary edema. Bilateral costophrenic angles are clear. Normal cardio-mediastinal silhouette. No acute osseous abnormalities. The soft tissues are within normal limits. Left IJ hemodialysis catheter noted with its tip overlying the cavoatrial junction region. IMPRESSION: No active cardiopulmonary disease. Electronically Signed   By: Jules Schick M.D.   On: 07/26/2023 13:01    Procedures Procedures   Medications Ordered in ED Medications  sodium zirconium cyclosilicate (LOKELMA) packet 10 g (has no administration in time range)    ED Course/ Medical Decision Making/ A&P Clinical Course as of 07/26/23 1600  Sun Jul 26, 2023  1427 Vascular consulted. Awaiting call back. [RR]    Clinical Course User Index [RR] Achille Rich, PA-C   Medical Decision Making Amount and/or Complexity of Data Reviewed Labs: ordered. Radiology: ordered.  Risk Prescription drug management. Decision regarding hospitalization.   48 y.o. male presents to the ER for evaluation of vascualr access issue. Differential diagnosis includes but is not limited to vascular access issue, hyperkalemia. Vital signs BP 129/92, otherwise unremarkable. Physical exam as noted above.   I independently reviewed and interpreted the patient's labs.  CBC with mild anemia with hemoglobin of 11.  No other cytosis.  BMP shows mildly elevated potassium at 5.2.  Glucose at 100.  BUN 81 with a  creatinine of 16.74.  Anion gap of 17.  I consulted nephrology and spoke with Dr. Arlean Hopping. The patient has had access made by both vascular and IR before. Recommends consultation and medical admission.  Will give 10g of Lokelma for the mild hyperkalemia.   I called Dr. Hetty Blend with vascular service who will the the patient inpatient. Recommends medical admission.   Patient is aware of the plan and is amenable to admission.   Admit to Dr. Artis Flock with hospital team.   Portions of this report may have been transcribed using voice recognition software. Every effort was made to ensure accuracy; however, inadvertent computerized transcription errors may be present.     Final Clinical  Impression(s) / ED Diagnoses Final diagnoses:  Problem with vascular access  Hyperkalemia    Rx / DC Orders ED Discharge Orders     None         Achille Rich, PA-C 07/26/23 1633    Wynetta Fines, MD 07/27/23 [phone]

## 2023-07-26 NOTE — H&P (Signed)
History and Physical    Patient: [name] EAV:[id number] DOB: 10-Apr-1976 DOA: 07/26/2023 DOS: the patient was seen and examined on 07/26/2023 PCP: [name], MD  Patient coming from: Home - lives alone. Ambulates independently    Chief Complaint: vascular access problem   HPI: [name] is a 48 y.o. male with medical history significant of ESRD on dialysis TTS, HTN, PAF, T2DM, HLD, failed renal transplant who presented to ED due to clot in his dialysis graft and inability to complete dialysis on Thursday. Last full session was Tuesday. [facility] and never heard [facility] from them on Thursday and then was told to come to ED. He feels good.   He was seen at [facility] on 06/17/23 for a left hero revision by Dr. Harlon Flor. At f/u on 1/22 with [facility] no one had tried to use his graft.  11/13: IVC recanalization with stenting of IVC and right iliac veins 12/11: SVC recancalization with left neck transmediastinal CVC placement and left femoral perm cath exchange    He has been feeling good. Denies any fever/chills, vision changes/headaches, chest pain or palpitations, shortness of breath or cough, abdominal pain, N/V/D, dysuria or leg swelling.   He does not smoke or drink alcohol. Last took eliquis 12/15 at 7pm   ER Course:  vitals: afebrile, bp: 129/92, HR: 79, RR: 18, oxygen: 100%RA Pertinent labs: hgb: 11, potassium: 5.2, BuN: 81, creatinine: 16.74, AG: 17 In ED: nephrology consulted and given lokelma.     Review of Systems: As mentioned in the history of present illness. All other systems reviewed and are negative. Past Medical History:  Diagnosis Date   Acute respiratory failure with hypoxia (HCC) 11/30/2018   Anemia    ESRD   ESRD on hemodialysis (HCC) 06/07/2011   TTS [place]. Started HD in 2006, got transplant in 2014 lasted until Dec 2019 then went back on HD.  Has L thigh AVG as of Jun 2020.  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    History of  blood transfusion    Hypertension    03/19/22- has not had high blood pressure in 5 years.   Morbid obesity (HCC)    Paroxysmal atrial fibrillation (HCC)    Pre-diabetes    no meds   Sepsis (HCC) 11/30/2018   Sleep apnea    lost weight, does not use CPAP   Past Surgical History:  Procedure Laterality Date   ALLOGRAFT APPLICATION Right 02/01/2023   Procedure: ALLOGRAFT [facility];  Surgeon: Edwin Cap, DPM;  Location: [facility] OR;  Service: Orthopedics/Podiatry;  Laterality: Right;   AMPUTATION Right 12/12/2022   Procedure: AMPUTATION RAY;  Surgeon: Louann Sjogren, DPM;  Location: [facility] OR;  Service: Orthopedics/Podiatry;  Laterality: Right;  surgical team to do local block   APPLICATION OF WOUND VAC Right 02/01/2023   Procedure: APPLICATION OF WOUND VAC;  Surgeon: Edwin Cap, DPM;  Location: [facility] OR;  Service: Orthopedics/Podiatry;  Laterality: Right;   ARTERIOVENOUS GRAFT PLACEMENT  08/09/2010   Left Thigh Graft by Dr. Cari Caraway   AV FISTULA PLACEMENT Right 05/18/2018   Procedure: INSERTION OF ARTERIOVENOUS (AV) GORE-TEX GRAFT Left THIGH;  Surgeon: Maeola Harman, MD;  Location: [facility] OR;  Service: Vascular;  Laterality: Right;   AV FISTULA PLACEMENT Right 02/15/2021   Procedure: INSERTION OF ARTERIOVENOUS (AV) GORE-TEX LOOP GRAFT RIGHT THIGH;  Surgeon: Maeola Harman, MD;  Location: [facility] OR;  Service: Vascular;  Laterality: Right;   BIOPSY  05/20/2022  Procedure: BIOPSY;  Surgeon: Shellia Cleverly, DO;  Location: [facility];  Service: Gastroenterology;;   CAPD INSERTION N/A 06/13/2022   Procedure: LAPAROSCOPIC INSERTION CONTINUOUS AMBULATORY PERITONEAL DIALYSIS  (CAPD) CATHETER;  Surgeon: Maeola Harman, MD;  Location: [facility] OR;  Service: Vascular;  Laterality: N/A;   CAPD REMOVAL  05/08/2011   Procedure: CONTINUOUS AMBULATORY PERITONEAL DIALYSIS  (CAPD) CATHETER REMOVAL;  Surgeon: Iona Coach, MD;  Location: [facility] OR;  Service: General;   Laterality: N/A;  Removal of CAPD catheter, Dr. requests to go after 100   CAPD REMOVAL N/A 08/08/2022   Procedure: REMOVAL CONTINUOUS AMBULATORY PERITONEAL DIALYSIS  (CAPD) CATHETER;  Surgeon: Maeola Harman, MD;  Location: [facility] OR;  Service: Vascular;  Laterality: N/A;   COLONOSCOPY WITH PROPOFOL N/A 05/20/2022   Procedure: COLONOSCOPY WITH PROPOFOL;  Surgeon: Shellia Cleverly, DO;  Location: [facility];  Service: Gastroenterology;  Laterality: N/A;   I & D EXTREMITY Right 12/08/2022   Procedure: IRRIGATION AND DEBRIDEMENT RIGHT FOOT;  Surgeon: Louann Sjogren, DPM;  Location: [facility] OR;  Service: Orthopedics/Podiatry;  Laterality: Right;   I & D EXTREMITY Right 02/01/2023   Procedure: IRRIGATION AND DEBRIDEMENT RIGHT FOOT;  Surgeon: Edwin Cap, DPM;  Location: [facility] OR;  Service: Orthopedics/Podiatry;  Laterality: Right;   INSERTION OF DIALYSIS CATHETER  10/05/2010   Right Femoral Cath insertion by Dr. Leonides Sake.  Pt ahas had several caths inserted.   INSERTION OF DIALYSIS CATHETER Right 02/15/2021   Procedure: Attempted INSERTION OF RIGHT and Left Internal Jugular DIALYSIS CATHETER, Insertion of Left Femoral Vein Dialysis Catheter;  Surgeon: Maeola Harman, MD;  Location: [facility] OR;  Service: Vascular;  Laterality: Right;   INSERTION OF DIALYSIS CATHETER Right 04/26/2021   Procedure: INSERTION OF TUNNELED 55cm PALIDROME PRECISION CHRONIC DIALYSIS CATHETER;  Surgeon: Leonie Douglas, MD;  Location: [facility] OR;  Service: Vascular;  Laterality: Right;   INSERTION OF DIALYSIS CATHETER Left 12/26/2021   Procedure: INSERTION OF TUNNELED  DIALYSIS CATHETER LEFT FEMORAL ARTERY;  Surgeon: Maeola Harman, MD;  Location: [facility] OR;  Service: Vascular;  Laterality: Left;   INSERTION OF DIALYSIS CATHETER Left 03/06/2022   Procedure: INSERTION OF DIALYSIS CATHETER;  Surgeon: Maeola Harman, MD;  Location: [facility] OR;  Service: Vascular;  Laterality: Left;   INSERTION OF DIALYSIS CATHETER  Left 12/31/2022   Procedure: INSERTION OF DIALYSIS CATHETER LEFT GROIN;  Surgeon: Leonie Douglas, MD;  Location: [facility] OR;  Service: Vascular;  Laterality: Left;   IR FLUORO GUIDE CV LINE LEFT  04/23/2022   IR FLUORO GUIDE CV LINE LEFT  01/29/2023   IR FLUORO GUIDE CV LINE LEFT  02/02/2023   IR FLUORO GUIDE CV LINE LEFT  04/02/2023   IR FLUORO GUIDE CV LINE RIGHT  05/11/2018   IR FLUORO GUIDE CV LINE RIGHT  07/03/2021   IR FLUORO GUIDE CV LINE RIGHT  07/16/2021   IR PTA ADDL CENTRAL DIALYSIS SEG THRU DIALY CIRCUIT RIGHT Right 07/03/2021   IR PTA AND STENT ADDL CENTRAL DIALY SEG THRU DIALY CIRCUIT LEFT Left 02/02/2023   IR RADIOLOGIST EVAL & MGMT  04/08/2023   IR US GUIDE VASC ACCESS LEFT  04/23/2022   IR US GUIDE VASC ACCESS RIGHT  05/11/2018   IR VENO/EXT/UNI LEFT  04/23/2022   IR VENO/EXT/UNI LEFT  01/29/2023   IR VENOCAVAGRAM IVC  07/16/2021   IR VENOCAVAGRAM IVC  02/02/2023   KIDNEY TRANSPLANT  2014   KNEE ARTHROSCOPY Left    LAPAROSCOPIC LYSIS OF ADHESIONS  N/A 06/13/2022   Procedure: LAPAROSCOPIC LYSIS OF ADHESIONS;  Surgeon: Maeola Harman, MD;  Location: [facility] OR;  Service: Vascular;  Laterality: N/A;   POLYPECTOMY  05/20/2022   Procedure: POLYPECTOMY;  Surgeon: Shellia Cleverly, DO;  Location: [facility];  Service: Gastroenterology;;   THROMBECTOMY W/ EMBOLECTOMY Right 04/26/2021   Procedure: REMOVAL OF RIGHT THIGH ARTERIOVENOUS GORE-TEX GRAFT AND REPAIR OF RIGHT COMMON FEMORAL ARTERY;  Surgeon: Leonie Douglas, MD;  Location: [facility] OR;  Service: Vascular;  Laterality: Right;   UPPER EXTREMITY ANGIOGRAPHY Bilateral 05/13/2018   Procedure: UPPER EXTREMITY ANGIOGRAPHY - bilarteral;  Surgeon: Cephus Shelling, MD;  Location: [facility];  Service: Cardiovascular;  Laterality: Bilateral;   Social History:  reports that he quit smoking about 7 years ago. His smoking use included cigarettes. He has never used smokeless tobacco. He reports that he does not currently use alcohol.  He reports that he does not currently use drugs after having used the following drugs: Marijuana.  No Known Allergies  Family History  Problem Relation Age of Onset   Diabetes Father    Stroke Father    Stroke Maternal Grandmother    Anesthesia problems Neg Hx     Prior to Admission medications   Medication Sig Start Date End Date Taking? Authorizing Provider  apixaban (ELIQUIS) 5 MG TABS tablet Take 1 tablet (5 mg total) by mouth 2 (two) times daily. 06/25/23   [name], MD  atorvastatin (LIPITOR) 40 MG tablet Take 1 tablet (40 mg total) by mouth daily. 02/20/23   [name], MD  Calcium Carb-Cholecalciferol (CALCIUM 600 + D PO) Take 1 tablet by mouth daily.    [provider]  cefTAZidime 2 g in dextrose 5 % 50 mL Inject 2 g into the vein Every Tuesday,Thursday,and Saturday with dialysis. TTS post HD until 11/17 04/07/23   [name], MD  clopidogrel (PLAVIX) 75 MG tablet Take 75 mg by mouth daily.    [provider]  ferric citrate (AURYXIA) 1 GM 210 MG(Fe) tablet Take 210 mg by mouth 3 (three) times daily with meals. 09/06/19   [provider]  midodrine (PROAMATINE) 5 MG tablet Take 15 mg by mouth 3 (three) times daily. Patient not taking: Reported on 05/25/2023 01/20/23   [provider]  Tenapanor HCl, CKD, (XPHOZAH) 30 MG TABS Take 30 mg by mouth in the morning and at bedtime.    [provider]    Physical Exam: Vitals:   07/26/23 1033 07/26/23 1034  BP: (!) 129/92   Pulse: 79   Resp: 18   Temp: 98.1 F (36.7 C)   SpO2: 100%   Weight:  132.6 kg  Height:  6\' 2"  (1.88 m)   General:  Appears calm and comfortable and is in NAD Eyes:  PERRL, EOMI, normal lids, iris ENT:  grossly normal hearing, lips & tongue, mmm; appropriate dentition Neck:  no LAD, masses or thyromegaly; no carotid bruits Cardiovascular:  RRR, no m/r/g. No LE edema.  Respiratory:   CTA bilaterally with no wheezes/rales/rhonchi.  Normal  respiratory effort. Abdomen:  soft, NT, ND, NABS Back:   normal alignment, no CVAT Skin:  no rash or induration seen on limited exam Musculoskeletal:  grossly normal tone BUE/BLE, good ROM, no bony abnormality Lower extremity:  No LE edema.  Limited foot exam with no ulcerations.  2+ distal pulses. Psychiatric:  grossly normal mood and affect, speech fluent and appropriate, AOx3 Neurologic:  CN 2-12 grossly intact,  moves all extremities in coordinated fashion, sensation intact   Radiological Exams on Admission: Independently reviewed - see discussion in A/P where applicable  DG Chest 2 View Result Date: 07/26/2023 CLINICAL DATA:  missed dialysis. EXAM: CHEST - 2 VIEW COMPARISON:  04/02/2023. FINDINGS: Bilateral lung fields are clear. No pulmonary edema. Bilateral costophrenic angles are clear. Normal cardio-mediastinal silhouette. No acute osseous abnormalities. The soft tissues are within normal limits. Left IJ hemodialysis catheter noted with its tip overlying the cavoatrial junction region. IMPRESSION: No active cardiopulmonary disease. Electronically Signed   By: Jules Schick M.D.   On: 07/26/2023 13:01    EKG: Independently reviewed.  NSR with rate 74; nonspecific ST changes with no evidence of acute ischemia   Labs on Admission: I have personally reviewed the available labs and imaging studies at the time of the admission.  Pertinent labs:   hgb: 11,  potassium: 5.2,  BuN: 81,  creatinine: 16.74,  AG: 17  Assessment and Plan: Principal Problem:   Problem with vascular access in ESRD on dialysis Active Problems:   Paroxysmal atrial fibrillation (HCC)   Hyperlipidemia   S/P kidney transplant   Type 2 diabetes mellitus with other diabetic kidney complication (HCC)   Hypotension   ESRD on hemodialysis (HCC)    Assessment and Plan: * Problem with vascular access in ESRD on dialysis 49 year old presenting to ED due to clotted fistula for his dialysis. Last full session  was Tuesday this week and clotted off on Thursday -obs to tele -He was seen at [facility] on 06/17/23 for a left hero revision by Dr. Harlon Flor. At f/u on 1/22 with [facility] no one had tried to use his graft.  11/13: IVC recanalization with stenting of IVC and right iliac veins 12/11: SVC recancalization with left neck transmediastinal CVC placement and left femoral perm cath exchange  -nephrology consulted. Appreciate assitance . Nephrology discussed with IR with plans to see him tomorrow to evaluate for HD access. Options are very limited. plan for NPO after midnight  -IV team could not get access in ED  -electrolytes stable, vitals stable  -hold eliquis. Last had 12/15 at 7pm  -on ASA, plavix, lipitor    Paroxysmal atrial fibrillation (HCC) In NSR. On eliquis   Hyperlipidemia Continue lipitor 40mg    S/P kidney transplant Failed transplant   Type 2 diabetes mellitus with other diabetic kidney complication (HCC) A1C of 5.2 in 02/2023  Carb modified diet   Hypotension Continue midodrine      Advance Care Planning:   Code Status: Full Code   Consults: nephrology, IR DVT Prophylaxis: SCDs (hold eliquis. Last took 2/15 at 7pm)   Family Communication: none   Severity of Illness: The appropriate patient status for this patient is OBSERVATION. Observation status is judged to be reasonable and necessary in order to provide the required intensity of service to ensure the patient's safety. The patient's presenting symptoms, physical exam findings, and initial radiographic and laboratory data in the context of their medical condition is felt to place them at decreased risk for further clinical deterioration. Furthermore, it is anticipated that the patient will be medically stable for discharge from the hospital within 2 midnights of admission.   Author: Orland Mustard, MD 07/26/2023 4:11 PM  For on call review [email or web address].

## 2023-07-26 NOTE — Assessment & Plan Note (Signed)
 Failed transplant

## 2023-07-26 NOTE — Assessment & Plan Note (Addendum)
48 year old presenting to ED due to clotted fistula for his dialysis. Last full session was Tuesday this week and clotted off on Thursday -obs to tele -He was seen at [facility] on 06/17/23 for a left hero revision by Dr. Harlon Flor. At f/u on 1/22 with [facility] no one had tried to use his graft.  11/13: IVC recanalization with stenting of IVC and right iliac veins 12/11: SVC recancalization with left neck transmediastinal CVC placement and left femoral perm cath exchange  -nephrology consulted. Appreciate assitance . Nephrology discussed with IR with plans to see him tomorrow to evaluate for HD access. Options are very limited. plan for NPO after midnight  -IV team could not get access in ED  -electrolytes stable, vitals stable  -hold eliquis. Last had 12/15 at 7pm  -on ASA, plavix, lipitor

## 2023-07-26 NOTE — Consult Note (Signed)
Renal Service Consult Note [facility]  Tony Granquist [facility] 07/26/2023 Maree Krabbe, MD Requesting Physician: Dr. Rodena Medin  Reason for Consult: ESRD pt w/ clotted HD graft HPI: The patient is a 48 y.o. year-old w/ PMH as below who presented to ED because of a "clotted HD graft". It clotted on Thursday during his OP HD session. HD is TTS, last full session was Tuesday. In ED BP 130/ 70, HR 80, RR 16, afeb.  K 5.2  BUN 81  creat 16.7  Hb 11. WBC 4K. Pt to be admitted. We are asked to see for dialysis.   Chart review: Pt has a long history of HD access problems, including TDC's at all sites, bilat past femoral loop grafts and likely bilat UE's fistulas&grafts. Also has hx of bilateral brachiocephalic vein occlusion, known IVC occlusion, and known iliac vein occlusion. In Aug 2024 IR did a revascularization of the L occluded iliac vein w/ PTA and venous stenting for restoration of flow and the IVC stenosis was PTA'd as well. Then his [facility] was exchanged. In October 2024 he was admitted for morganella bacteremia. Since the site was his last remaining access site, there was no line holiday and the [facility] was simply replaced. Pt was considering trans-hepatic TDC but then elected to see [facility] for a HeRO AV graft. He was supposed to get 3 wks of IV fortaz then f/u blood cx's via TOC 3-5 days after abx were dc'd, then hopefully they could be [facility] to place a HeRO graft then.   Pt seen in ED hallway bed. No SOB, or abd pain, no n/v/d, no confusion or CP. No fevers. States that [facility] placed his LUA HeRO AVG last December. It clotted "right away" and they fixed it, then it clotted again in Jan and they declotted it / "cleaned it out". The graft was working well this month so they removed the L femoral TDC last week.   Have discussed w/ IR. Pt is quite complicated and no guarantees can be made. The L femoral access may or may not be accessible with the catheter out. They will see him tomorrow.     PMH Anemia of esrd ESRD on HD - on HD since 2006 (except for renal Tx as below) HTN Morbid obesity PAF OSA H/o renal transplant 2014- 2019  ROS - denies CP, no joint pain, no HA, no blurry vision, no rash, no diarrhea, no nausea/ vomiting   Past Medical History  Past Medical History:  Diagnosis Date   Acute respiratory failure with hypoxia (HCC) 11/30/2018   Anemia    ESRD   ESRD on hemodialysis (HCC) 06/07/2011   TTS [place]. Started HD in 2006, got transplant in 2014 lasted until Dec 2019 then went back on HD.  Has L thigh AVG as of Jun 2020.  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    History of blood transfusion    Hypertension    03/19/22- has not had high blood pressure in 5 years.   Morbid obesity (HCC)    Paroxysmal atrial fibrillation (HCC)    Pre-diabetes    no meds   Sepsis (HCC) 11/30/2018   Sleep apnea    lost weight, does not use CPAP   Past Surgical History  Past Surgical History:  Procedure Laterality Date   ALLOGRAFT APPLICATION Right 02/01/2023   Procedure: ALLOGRAFT [facility];  Surgeon: Edwin Cap, DPM;  Location: [facility] OR;  Service: Orthopedics/Podiatry;  Laterality: Right;   AMPUTATION  Right 12/12/2022   Procedure: AMPUTATION RAY;  Surgeon: Louann Sjogren, DPM;  Location: [facility] OR;  Service: Orthopedics/Podiatry;  Laterality: Right;  surgical team to do local block   APPLICATION OF WOUND VAC Right 02/01/2023   Procedure: APPLICATION OF WOUND VAC;  Surgeon: Edwin Cap, DPM;  Location: [facility] OR;  Service: Orthopedics/Podiatry;  Laterality: Right;   ARTERIOVENOUS GRAFT PLACEMENT  08/09/2010   Left Thigh Graft by Dr. Cari Caraway   AV FISTULA PLACEMENT Right 05/18/2018   Procedure: INSERTION OF ARTERIOVENOUS (AV) GORE-TEX GRAFT Left THIGH;  Surgeon: Maeola Harman, MD;  Location: [facility] OR;  Service: Vascular;  Laterality: Right;   AV FISTULA PLACEMENT Right 02/15/2021   Procedure: INSERTION OF ARTERIOVENOUS (AV) GORE-TEX  LOOP GRAFT RIGHT THIGH;  Surgeon: Maeola Harman, MD;  Location: [facility] OR;  Service: Vascular;  Laterality: Right;   BIOPSY  05/20/2022   Procedure: BIOPSY;  Surgeon: Shellia Cleverly, DO;  Location: [facility];  Service: Gastroenterology;;   CAPD INSERTION N/A 06/13/2022   Procedure: LAPAROSCOPIC INSERTION CONTINUOUS AMBULATORY PERITONEAL DIALYSIS  (CAPD) CATHETER;  Surgeon: Maeola Harman, MD;  Location: [facility] OR;  Service: Vascular;  Laterality: N/A;   CAPD REMOVAL  05/08/2011   Procedure: CONTINUOUS AMBULATORY PERITONEAL DIALYSIS  (CAPD) CATHETER REMOVAL;  Surgeon: Iona Coach, MD;  Location: [facility] OR;  Service: General;  Laterality: N/A;  Removal of CAPD catheter, Dr. requests to go after 100   CAPD REMOVAL N/A 08/08/2022   Procedure: REMOVAL CONTINUOUS AMBULATORY PERITONEAL DIALYSIS  (CAPD) CATHETER;  Surgeon: Maeola Harman, MD;  Location: [facility] OR;  Service: Vascular;  Laterality: N/A;   COLONOSCOPY WITH PROPOFOL N/A 05/20/2022   Procedure: COLONOSCOPY WITH PROPOFOL;  Surgeon: Shellia Cleverly, DO;  Location: [facility];  Service: Gastroenterology;  Laterality: N/A;   I & D EXTREMITY Right 12/08/2022   Procedure: IRRIGATION AND DEBRIDEMENT RIGHT FOOT;  Surgeon: Louann Sjogren, DPM;  Location: [facility] OR;  Service: Orthopedics/Podiatry;  Laterality: Right;   I & D EXTREMITY Right 02/01/2023   Procedure: IRRIGATION AND DEBRIDEMENT RIGHT FOOT;  Surgeon: Edwin Cap, DPM;  Location: [facility] OR;  Service: Orthopedics/Podiatry;  Laterality: Right;   INSERTION OF DIALYSIS CATHETER  10/05/2010   Right Femoral Cath insertion by Dr. Leonides Sake.  Pt ahas had several caths inserted.   INSERTION OF DIALYSIS CATHETER Right 02/15/2021   Procedure: Attempted INSERTION OF RIGHT and Left Internal Jugular DIALYSIS CATHETER, Insertion of Left Femoral Vein Dialysis Catheter;  Surgeon: Maeola Harman, MD;  Location: [facility] OR;  Service: Vascular;  Laterality: Right;   INSERTION OF  DIALYSIS CATHETER Right 04/26/2021   Procedure: INSERTION OF TUNNELED 55cm PALIDROME PRECISION CHRONIC DIALYSIS CATHETER;  Surgeon: Leonie Douglas, MD;  Location: [facility] OR;  Service: Vascular;  Laterality: Right;   INSERTION OF DIALYSIS CATHETER Left 12/26/2021   Procedure: INSERTION OF TUNNELED  DIALYSIS CATHETER LEFT FEMORAL ARTERY;  Surgeon: Maeola Harman, MD;  Location: [facility] OR;  Service: Vascular;  Laterality: Left;   INSERTION OF DIALYSIS CATHETER Left 03/06/2022   Procedure: INSERTION OF DIALYSIS CATHETER;  Surgeon: Maeola Harman, MD;  Location: [facility] OR;  Service: Vascular;  Laterality: Left;   INSERTION OF DIALYSIS CATHETER Left 12/31/2022   Procedure: INSERTION OF DIALYSIS CATHETER LEFT GROIN;  Surgeon: Leonie Douglas, MD;  Location: [facility] OR;  Service: Vascular;  Laterality: Left;   IR FLUORO GUIDE CV LINE LEFT  04/23/2022   IR FLUORO GUIDE CV LINE LEFT  01/29/2023   IR  FLUORO GUIDE CV LINE LEFT  02/02/2023   IR FLUORO GUIDE CV LINE LEFT  04/02/2023   IR FLUORO GUIDE CV LINE RIGHT  05/11/2018   IR FLUORO GUIDE CV LINE RIGHT  07/03/2021   IR FLUORO GUIDE CV LINE RIGHT  07/16/2021   IR PTA ADDL CENTRAL DIALYSIS SEG THRU DIALY CIRCUIT RIGHT Right 07/03/2021   IR PTA AND STENT ADDL CENTRAL DIALY SEG THRU DIALY CIRCUIT LEFT Left 02/02/2023   IR RADIOLOGIST EVAL & MGMT  04/08/2023   IR US GUIDE VASC ACCESS LEFT  04/23/2022   IR US GUIDE VASC ACCESS RIGHT  05/11/2018   IR VENO/EXT/UNI LEFT  04/23/2022   IR VENO/EXT/UNI LEFT  01/29/2023   IR VENOCAVAGRAM IVC  07/16/2021   IR VENOCAVAGRAM IVC  02/02/2023   KIDNEY TRANSPLANT  2014   KNEE ARTHROSCOPY Left    LAPAROSCOPIC LYSIS OF ADHESIONS N/A 06/13/2022   Procedure: LAPAROSCOPIC LYSIS OF ADHESIONS;  Surgeon: Maeola Harman, MD;  Location: [facility] OR;  Service: Vascular;  Laterality: N/A;   POLYPECTOMY  05/20/2022   Procedure: POLYPECTOMY;  Surgeon: Shellia Cleverly, DO;  Location: [facility];  Service: Gastroenterology;;    THROMBECTOMY W/ EMBOLECTOMY Right 04/26/2021   Procedure: REMOVAL OF RIGHT THIGH ARTERIOVENOUS GORE-TEX GRAFT AND REPAIR OF RIGHT COMMON FEMORAL ARTERY;  Surgeon: Leonie Douglas, MD;  Location: [facility] OR;  Service: Vascular;  Laterality: Right;   UPPER EXTREMITY ANGIOGRAPHY Bilateral 05/13/2018   Procedure: UPPER EXTREMITY ANGIOGRAPHY - bilarteral;  Surgeon: Cephus Shelling, MD;  Location: [facility] INVASIVE CV LAB;  Service: Cardiovascular;  Laterality: Bilateral;   Family History  Family History  Problem Relation Age of Onset   Diabetes Father    Stroke Father    Stroke Maternal Grandmother    Anesthesia problems Neg Hx    Social History  reports that he quit smoking about 7 years ago. His smoking use included cigarettes. He has never used smokeless tobacco. He reports that he does not currently use alcohol. He reports that he does not currently use drugs after having used the following drugs: Marijuana. Allergies No Known Allergies Home medications Prior to Admission medications   Medication Sig Start Date End Date Taking? Authorizing Provider  apixaban (ELIQUIS) 5 MG TABS tablet Take 1 tablet (5 mg total) by mouth 2 (two) times daily. 06/25/23   [name], MD  atorvastatin (LIPITOR) 40 MG tablet Take 1 tablet (40 mg total) by mouth daily. 02/20/23   [name], MD  Calcium Carb-Cholecalciferol (CALCIUM 600 + D PO) Take 1 tablet by mouth daily.    [provider]  cefTAZidime 2 g in dextrose 5 % 50 mL Inject 2 g into the vein Every Tuesday,Thursday,and Saturday with dialysis. TTS post HD until 11/17 04/07/23   [name], MD  clopidogrel (PLAVIX) 75 MG tablet Take 75 mg by mouth daily.    [provider]  ferric citrate (AURYXIA) 1 GM 210 MG(Fe) tablet Take 210 mg by mouth 3 (three) times daily with meals. 09/06/19   [provider]  midodrine (PROAMATINE) 5 MG tablet Take 15 mg by mouth 3 (three) times daily. Patient not taking: Reported on  05/25/2023 01/20/23   [provider]  Tenapanor HCl, CKD, (XPHOZAH) 30 MG TABS Take 30 mg by mouth in the morning and at bedtime.    [provider]     Vitals:   07/26/23 1033 07/26/23 1034  BP: (!) 129/92   Pulse: 79   Resp: 18  Temp: 98.1 F (36.7 C)   SpO2: 100%   Weight:  132.6 kg  Height:  6\' 2"  (1.88 m)   Exam Gen alert, no distress No rash, cyanosis or gangrene Sclera anicteric, throat clear  No jvd or bruits Chest clear bilat to bases, no rales/ wheezing RRR no MRG Abd soft ntnd no mass or ascites +bs GU nl male MS no joint effusions or deformity Ext no LE or UE edema, no other edema Neuro is alert, Ox 3 , nf    L AVG no bruit       Renal-related home meds: - auryxia 210 ac tid - midodrine 15 mg tid - tenapanor 30mg  bid - others: plavix, eliquis, statin    OP HD: SW TTS 4.5h   B500   136.7kg   2K bath  L AVG HeRO  Heparin 5000 + 2500 midrun - last HD 2/11, post wt 138kg, has been getting to dry wt - good compliance - hectorol 6 mcg three times per week - no esa, last Hb 10.9 - last pth 973   In ED BP 130/ 70  HR 80   RR 16    afebrile    K 5.2  BUN 81  creat 16.7  Hb 11 WBC 4K    Assessment/ Plan: Clotted LUA HeRO AV graft - have d/w IR as above. They will see him tomorrow to evaluate for HD access. His vascular access is limited as noted in all prior notes by IR w/ multiple occluded central veins. Pt is aware. NPO after MN.  ESRD - on HD TTS. Last HD 2/13. Labs and vol good except for mild ^K+. Plan is for HD after access established.  Hyperkalemia - K+ 5.2, give lokelma 10 gm x 1. Renal diet.  BP - not on any bp lowering meds. Takes midodrine 15 tid. BP's good here.  Volume - no vol excess on exam, get standing wt when in a room.  Anemia of esrd - Hb 11, no esa needs.  Secondary hyperparathyroidism - Ca in range, add on alb/phos. Cont binders w/ meals and IV vdra tts.    Vinson Moselle  MD [facility] 07/26/2023, 1:11 PM  Recent Labs   Lab 07/26/23 1137  HGB 11.0*  CALCIUM 9.7  CREATININE 16.74*  K 5.2*   Inpatient medications:  sodium zirconium cyclosilicate  10 g Oral Once

## 2023-07-26 NOTE — ED Triage Notes (Signed)
Pt states when he was at dialysis on Thursday, his dialysis graft clotted up. Pt goes to dialysis on Tues, Thurs, Sat. Pt states his last full treatment for dialysis was Tuesday.

## 2023-07-26 NOTE — ED Notes (Addendum)
NPO @ Midnight per RN Georgann Housekeeper

## 2023-07-27 ENCOUNTER — Encounter (HOSPITAL_COMMUNITY): Payer: Self-pay | Admitting: Family Medicine

## 2023-07-27 ENCOUNTER — Observation Stay (HOSPITAL_COMMUNITY): Payer: Medicare HMO

## 2023-07-27 DIAGNOSIS — E7800 Pure hypercholesterolemia, unspecified: Secondary | ICD-10-CM

## 2023-07-27 DIAGNOSIS — T82868A Thrombosis of vascular prosthetic devices, implants and grafts, initial encounter: Secondary | ICD-10-CM | POA: Diagnosis present

## 2023-07-27 DIAGNOSIS — N2581 Secondary hyperparathyroidism of renal origin: Secondary | ICD-10-CM | POA: Diagnosis present

## 2023-07-27 DIAGNOSIS — Z992 Dependence on renal dialysis: Secondary | ICD-10-CM | POA: Diagnosis not present

## 2023-07-27 DIAGNOSIS — Z5329 Procedure and treatment not carried out because of patient's decision for other reasons: Secondary | ICD-10-CM | POA: Diagnosis present

## 2023-07-27 DIAGNOSIS — Z789 Other specified health status: Secondary | ICD-10-CM | POA: Diagnosis not present

## 2023-07-27 DIAGNOSIS — I959 Hypotension, unspecified: Secondary | ICD-10-CM | POA: Diagnosis present

## 2023-07-27 DIAGNOSIS — I480 Paroxysmal atrial fibrillation: Secondary | ICD-10-CM | POA: Diagnosis present

## 2023-07-27 DIAGNOSIS — D631 Anemia in chronic kidney disease: Secondary | ICD-10-CM | POA: Diagnosis present

## 2023-07-27 DIAGNOSIS — Z6837 Body mass index (BMI) 37.0-37.9, adult: Secondary | ICD-10-CM | POA: Diagnosis not present

## 2023-07-27 DIAGNOSIS — Y832 Surgical operation with anastomosis, bypass or graft as the cause of abnormal reaction of the patient, or of later complication, without mention of misadventure at the time of the procedure: Secondary | ICD-10-CM | POA: Diagnosis present

## 2023-07-27 DIAGNOSIS — Y830 Surgical operation with transplant of whole organ as the cause of abnormal reaction of the patient, or of later complication, without mention of misadventure at the time of the procedure: Secondary | ICD-10-CM | POA: Diagnosis present

## 2023-07-27 DIAGNOSIS — E875 Hyperkalemia: Secondary | ICD-10-CM

## 2023-07-27 DIAGNOSIS — Z7982 Long term (current) use of aspirin: Secondary | ICD-10-CM | POA: Diagnosis not present

## 2023-07-27 DIAGNOSIS — Z7901 Long term (current) use of anticoagulants: Secondary | ICD-10-CM | POA: Diagnosis not present

## 2023-07-27 DIAGNOSIS — E66812 Obesity, class 2: Secondary | ICD-10-CM | POA: Diagnosis present

## 2023-07-27 DIAGNOSIS — Z87891 Personal history of nicotine dependence: Secondary | ICD-10-CM | POA: Diagnosis not present

## 2023-07-27 DIAGNOSIS — Z823 Family history of stroke: Secondary | ICD-10-CM | POA: Diagnosis not present

## 2023-07-27 DIAGNOSIS — I120 Hypertensive chronic kidney disease with stage 5 chronic kidney disease or end stage renal disease: Secondary | ICD-10-CM | POA: Diagnosis present

## 2023-07-27 DIAGNOSIS — N186 End stage renal disease: Secondary | ICD-10-CM | POA: Diagnosis present

## 2023-07-27 DIAGNOSIS — Z833 Family history of diabetes mellitus: Secondary | ICD-10-CM | POA: Diagnosis not present

## 2023-07-27 DIAGNOSIS — I871 Compression of vein: Secondary | ICD-10-CM | POA: Diagnosis not present

## 2023-07-27 DIAGNOSIS — Z604 Social exclusion and rejection: Secondary | ICD-10-CM | POA: Diagnosis present

## 2023-07-27 DIAGNOSIS — T8612 Kidney transplant failure: Secondary | ICD-10-CM | POA: Diagnosis present

## 2023-07-27 DIAGNOSIS — E785 Hyperlipidemia, unspecified: Secondary | ICD-10-CM | POA: Diagnosis present

## 2023-07-27 DIAGNOSIS — Z4901 Encounter for fitting and adjustment of extracorporeal dialysis catheter: Secondary | ICD-10-CM | POA: Diagnosis not present

## 2023-07-27 DIAGNOSIS — Z7902 Long term (current) use of antithrombotics/antiplatelets: Secondary | ICD-10-CM | POA: Diagnosis not present

## 2023-07-27 DIAGNOSIS — E1129 Type 2 diabetes mellitus with other diabetic kidney complication: Secondary | ICD-10-CM

## 2023-07-27 DIAGNOSIS — E1122 Type 2 diabetes mellitus with diabetic chronic kidney disease: Secondary | ICD-10-CM | POA: Diagnosis present

## 2023-07-27 HISTORY — PX: IR US GUIDE VASC ACCESS LEFT: IMG2389

## 2023-07-27 HISTORY — PX: IR PTA VENOUS EXCEPT DIALYSIS CIRCUIT: IMG6126

## 2023-07-27 HISTORY — PX: IR VENOCAVAGRAM IVC: IMG678

## 2023-07-27 HISTORY — PX: IR FLUORO GUIDE CV LINE LEFT: IMG2282

## 2023-07-27 LAB — BASIC METABOLIC PANEL
Anion gap: 18 — ABNORMAL HIGH (ref 5–15)
BUN: 89 mg/dL — ABNORMAL HIGH (ref 6–20)
CO2: 23 mmol/L (ref 22–32)
Calcium: 9.6 mg/dL (ref 8.9–10.3)
Chloride: 100 mmol/L (ref 98–111)
Creatinine, Ser: 17.99 mg/dL — ABNORMAL HIGH (ref 0.61–1.24)
GFR, Estimated: 3 mL/min — ABNORMAL LOW (ref >60)
Glucose, Bld: 88 mg/dL (ref 70–99)
Potassium: 5.1 mmol/L (ref 3.5–5.1)
Sodium: 141 mmol/L (ref 135–145)

## 2023-07-27 LAB — CBC
HCT: 32.1 % — ABNORMAL LOW (ref 39.0–52.0)
Hemoglobin: 10.3 g/dL — ABNORMAL LOW (ref 13.0–17.0)
MCH: 29.2 pg (ref 26.0–34.0)
MCHC: 32.1 g/dL (ref 30.0–36.0)
MCV: 90.9 fL (ref 80.0–100.0)
Platelets: 195 10*3/uL (ref 150–400)
RBC: 3.53 MIL/uL — ABNORMAL LOW (ref 4.22–5.81)
RDW: 17.4 % — ABNORMAL HIGH (ref 11.5–15.5)
WBC: 3.8 10*3/uL — ABNORMAL LOW (ref 4.0–10.5)
nRBC: 0 % (ref 0.0–0.2)

## 2023-07-27 LAB — HEPATITIS B SURFACE ANTIGEN: Hepatitis B Surface Ag: NONREACTIVE

## 2023-07-27 LAB — MRSA NEXT GEN BY PCR, NASAL: MRSA by PCR Next Gen: DETECTED — AB

## 2023-07-27 MED ORDER — IOHEXOL 300 MG/ML  SOLN
50.0000 mL | Freq: Once | INTRAMUSCULAR | Status: AC | PRN
  Administered 2023-07-27: 20 mL via INTRAVENOUS

## 2023-07-27 MED ORDER — MIDAZOLAM HCL 2 MG/2ML IJ SOLN
INTRAMUSCULAR | Status: AC
  Filled 2023-07-27: qty 2

## 2023-07-27 MED ORDER — CEFAZOLIN SODIUM-DEXTROSE 2-4 GM/100ML-% IV SOLN
INTRAVENOUS | Status: AC
  Filled 2023-07-27: qty 100

## 2023-07-27 MED ORDER — HEPARIN SODIUM (PORCINE) 1000 UNIT/ML IJ SOLN
4000.0000 [IU] | Freq: Once | INTRAMUSCULAR | Status: AC
  Administered 2023-07-27: 6.2 mL via INTRAVENOUS

## 2023-07-27 MED ORDER — LIDOCAINE-EPINEPHRINE 1 %-1:100000 IJ SOLN
INTRAMUSCULAR | Status: AC
  Filled 2023-07-27: qty 1

## 2023-07-27 MED ORDER — CEFAZOLIN SODIUM-DEXTROSE 2-4 GM/100ML-% IV SOLN
INTRAVENOUS | Status: AC | PRN
  Administered 2023-07-27: 2 g via INTRAVENOUS

## 2023-07-27 MED ORDER — CHLORHEXIDINE GLUCONATE CLOTH 2 % EX PADS
6.0000 | MEDICATED_PAD | Freq: Every day | CUTANEOUS | Status: DC
  Administered 2023-07-27: 6 via TOPICAL

## 2023-07-27 MED ORDER — LIDOCAINE-EPINEPHRINE 1 %-1:100000 IJ SOLN
20.0000 mL | Freq: Once | INTRAMUSCULAR | Status: AC
  Administered 2023-07-27: 10 mL

## 2023-07-27 MED ORDER — FENTANYL CITRATE (PF) 100 MCG/2ML IJ SOLN
INTRAMUSCULAR | Status: AC
  Filled 2023-07-27: qty 2

## 2023-07-27 MED ORDER — HEPARIN SODIUM (PORCINE) 1000 UNIT/ML IJ SOLN
INTRAMUSCULAR | Status: AC
  Filled 2023-07-27: qty 10

## 2023-07-27 MED ORDER — MUPIROCIN 2 % EX OINT
TOPICAL_OINTMENT | Freq: Two times a day (BID) | CUTANEOUS | Status: DC
  Filled 2023-07-27: qty 22

## 2023-07-27 MED ORDER — IOHEXOL 300 MG/ML  SOLN
50.0000 mL | Freq: Once | INTRAMUSCULAR | Status: AC | PRN
  Administered 2023-07-27: 10 mL via INTRAVENOUS

## 2023-07-27 MED ORDER — FENTANYL CITRATE (PF) 100 MCG/2ML IJ SOLN
INTRAMUSCULAR | Status: AC | PRN
  Administered 2023-07-27 (×4): 25 ug via INTRAVENOUS

## 2023-07-27 MED ORDER — MIDAZOLAM HCL 2 MG/2ML IJ SOLN
INTRAMUSCULAR | Status: AC | PRN
  Administered 2023-07-27: .5 mg via INTRAVENOUS
  Administered 2023-07-27: 1 mg via INTRAVENOUS
  Administered 2023-07-27: .5 mg via INTRAVENOUS

## 2023-07-27 NOTE — Progress Notes (Signed)
[facility] Progress Note   Subjective: Seen in room. No C/Os, lying flat. Last HD last Tuesday 07/23/2023. Going to IR for [facility] placement then HD off schedule later today.    Objective Vitals:   07/26/23 1954 07/26/23 2027 07/27/23 0517 07/27/23 0725  BP: (!) 141/76 (!) 143/96 137/88 116/78  Pulse: 85 72 77 78  Resp: 17 20 18    Temp: 98.5 F (36.9 C) 97.7 F (36.5 C) 98.6 F (37 C) 98.5 F (36.9 C)  TempSrc: Oral Oral  Oral  SpO2: 100% 100% 100% 99%  Weight:      Height:       Physical Exam General: Chronically ill appearing male in NAD Heart:HS distant (body habitus) S1,S2, RRR No M/R/G Lungs: CTAB slightly decreased in bases. No WOB.  Abdomen: Obese, NABS, NT Extremities: No LE edema.  Dialysis Access: L HERO AVG no bruit/thrill   Additional Objective Labs: Basic Metabolic Panel: Recent Labs  Lab 07/26/23 1137 07/27/23 0405  NA 139 141  K 5.2* 5.1  CL 99 100  CO2 23 23  GLUCOSE 100* 88  BUN 81* 89*  CREATININE 16.74* 17.99*  CALCIUM 9.7 9.6  PHOS 5.4*  --    Liver Function Tests: Recent Labs  Lab 07/26/23 1137  ALBUMIN 3.6   No results for input(s): "LIPASE", "AMYLASE" in the last 168 hours. CBC: Recent Labs  Lab 07/26/23 1137 07/27/23 0405  WBC 4.1 3.8*  NEUTROABS 2.4  --   HGB 11.0* 10.3*  HCT 35.5* 32.1*  MCV 94.2 90.9  PLT 205 195   Blood Culture    Component Value Date/Time   SDES CATH TIP 04/02/2023 0930   SPECREQUEST  04/02/2023 0930    NONE Performed at [facility], [address] [place], [place] [address]    CULT MORGANELLA MORGANII (A) 04/02/2023 0930   REPTSTATUS 04/04/2023 FINAL 04/02/2023 0930    Cardiac Enzymes: No results for input(s): "CKTOTAL", "CKMB", "CKMBINDEX", "TROPONINI" in the last 168 hours. CBG: No results for input(s): "GLUCAP" in the last 168 hours. Iron Studies: No results for input(s): "IRON", "TIBC", "TRANSFERRIN", "FERRITIN" in the last 72  hours. @lablastinr3 @ Studies/Results: DG Chest 2 View Result Date: 07/26/2023 CLINICAL DATA:  missed dialysis. EXAM: CHEST - 2 VIEW COMPARISON:  04/02/2023. FINDINGS: Bilateral lung fields are clear. No pulmonary edema. Bilateral costophrenic angles are clear. Normal cardio-mediastinal silhouette. No acute osseous abnormalities. The soft tissues are within normal limits. Left IJ hemodialysis catheter noted with its tip overlying the cavoatrial junction region. IMPRESSION: No active cardiopulmonary disease. Electronically Signed   By: Jules Schick M.D.   On: 07/26/2023 13:01   Medications:   aspirin EC  81 mg Oral Daily   atorvastatin  40 mg Oral Daily   Chlorhexidine Gluconate Cloth  6 each Topical Q0600   Chlorhexidine Gluconate Cloth  6 each Topical Q0600   clopidogrel  75 mg Oral Daily   [START ON 07/28/2023] doxercalciferol  6 mcg Intravenous Q T,Th,Sa-HD   ferric citrate  210 mg Oral TID WC   midodrine  15 mg Oral TID WC   mupirocin ointment   Nasal BID     OP HD: SW TTS 4.5h   B500   136.7kg   2K bath  L AVG HeRO   - Heparin 5000 units IV initial bolus + 2500 units midrun - last HD 2/11, post wt 138kg, has been getting to dry wt - good compliance - hectorol 6 mcg three times per week -  no esa, last Hb 10.9 - last pth 973       Assessment/ Plan: Clotted LUA HeRO AV graft - have d/w IR as above. They will see him tomorrow to evaluate for HD access. His vascular access is limited as noted in all prior notes by IR w/ multiple occluded central veins. Pt is aware. NPO after MN.  ESRD - on HD TTS. Last HD 2/13. Labs and vol good except for mild ^K+. Plan is for HD after access established.  Hyperkalemia - K+ 5.1, give lokelma 10 gm x 1. Renal diet.  BP - not on any bp lowering meds. Takes midodrine 15 tid. BP's good here.  Volume - no vol excess on exam, get standing wt when in a room.  Anemia of esrd - Hb 11, no esa needs.  Secondary hyperparathyroidism - Ca in range, add on  alb/phos. Cont binders w/ meals and IV vdra tts.   Eyanna Mcgonagle H. Earlena Werst NP-C 07/27/2023, 11:16 AM  [employer] [phone]

## 2023-07-27 NOTE — Consult Note (Signed)
Hospital Consult    Reason for Consult: Occluded left hero graft Requesting Service/Physician: ED  MRN #:  [id number]  History of Present Illness: This is a 48 y.o. male with ESRD on dialysis via hero graft on Tuesday Thursday Saturday.  History of graft was put in at [facility].  His last full session of dialysis was Tuesday and was a unable to complete dialysis on Thursday.  He reports he had called [facility] multiple times we not hear back and his dialysis center told him to come to his local ED which is here at [facility].  He last had a revision of his hero graft on 06/17/2023 at [facility].  Prior to that he has had IVC and SVC interventions at [facility].  He last took Eliquis 2/15 which she is on for A-fib.  Past Medical History:  Diagnosis Date   Acute respiratory failure with hypoxia (HCC) 11/30/2018   Anemia    ESRD   ESRD on hemodialysis (HCC) 06/07/2011   TTS [place] [facility]. Started HD in 2006, got transplant in 2014 lasted until Dec 2019 then went back on HD.  Has L thigh AVG as of Jun 2020.  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    History of blood transfusion    Hypertension    03/19/22- has not had high blood pressure in 5 years.   Morbid obesity (HCC)    Paroxysmal atrial fibrillation (HCC)    Pre-diabetes    no meds   Sepsis (HCC) 11/30/2018   Sleep apnea    lost weight, does not use CPAP    Past Surgical History:  Procedure Laterality Date   ALLOGRAFT APPLICATION Right 02/01/2023   Procedure: ALLOGRAFT [facility];  Surgeon: Edwin Cap, DPM;  Location: [facility] OR;  Service: Orthopedics/Podiatry;  Laterality: Right;   AMPUTATION Right 12/12/2022   Procedure: AMPUTATION RAY;  Surgeon: Louann Sjogren, DPM;  Location: [facility] OR;  Service: Orthopedics/Podiatry;  Laterality: Right;  surgical team to do local block   APPLICATION OF WOUND VAC Right 02/01/2023   Procedure: APPLICATION OF WOUND VAC;  Surgeon: Edwin Cap, DPM;  Location: [facility] OR;  Service: Orthopedics/Podiatry;   Laterality: Right;   ARTERIOVENOUS GRAFT PLACEMENT  08/09/2010   Left Thigh Graft by Dr. Cari Caraway   AV FISTULA PLACEMENT Right 05/18/2018   Procedure: INSERTION OF ARTERIOVENOUS (AV) GORE-TEX GRAFT Left THIGH;  Surgeon: Maeola Harman, MD;  Location: [facility] OR;  Service: Vascular;  Laterality: Right;   AV FISTULA PLACEMENT Right 02/15/2021   Procedure: INSERTION OF ARTERIOVENOUS (AV) GORE-TEX LOOP GRAFT RIGHT THIGH;  Surgeon: Maeola Harman, MD;  Location: [facility] OR;  Service: Vascular;  Laterality: Right;   BIOPSY  05/20/2022   Procedure: BIOPSY;  Surgeon: Shellia Cleverly, DO;  Location: [facility];  Service: Gastroenterology;;   CAPD INSERTION N/A 06/13/2022   Procedure: LAPAROSCOPIC INSERTION CONTINUOUS AMBULATORY PERITONEAL DIALYSIS  (CAPD) CATHETER;  Surgeon: Maeola Harman, MD;  Location: [facility] OR;  Service: Vascular;  Laterality: N/A;   CAPD REMOVAL  05/08/2011   Procedure: CONTINUOUS AMBULATORY PERITONEAL DIALYSIS  (CAPD) CATHETER REMOVAL;  Surgeon: Iona Coach, MD;  Location: [facility] OR;  Service: General;  Laterality: N/A;  Removal of CAPD catheter, Dr. requests to go after 100   CAPD REMOVAL N/A 08/08/2022   Procedure: REMOVAL CONTINUOUS AMBULATORY PERITONEAL DIALYSIS  (CAPD) CATHETER;  Surgeon: Maeola Harman, MD;  Location: [facility] OR;  Service: Vascular;  Laterality: N/A;   COLONOSCOPY WITH PROPOFOL N/A 05/20/2022  Procedure: COLONOSCOPY WITH PROPOFOL;  Surgeon: Shellia Cleverly, DO;  Location: [facility];  Service: Gastroenterology;  Laterality: N/A;   I & D EXTREMITY Right 12/08/2022   Procedure: IRRIGATION AND DEBRIDEMENT RIGHT FOOT;  Surgeon: Louann Sjogren, DPM;  Location: [facility] OR;  Service: Orthopedics/Podiatry;  Laterality: Right;   I & D EXTREMITY Right 02/01/2023   Procedure: IRRIGATION AND DEBRIDEMENT RIGHT FOOT;  Surgeon: Edwin Cap, DPM;  Location: [facility] OR;  Service: Orthopedics/Podiatry;  Laterality: Right;   INSERTION OF  DIALYSIS CATHETER  10/05/2010   Right Femoral Cath insertion by Dr. Leonides Sake.  Pt ahas had several caths inserted.   INSERTION OF DIALYSIS CATHETER Right 02/15/2021   Procedure: Attempted INSERTION OF RIGHT and Left Internal Jugular DIALYSIS CATHETER, Insertion of Left Femoral Vein Dialysis Catheter;  Surgeon: Maeola Harman, MD;  Location: [facility] OR;  Service: Vascular;  Laterality: Right;   INSERTION OF DIALYSIS CATHETER Right 04/26/2021   Procedure: INSERTION OF TUNNELED 55cm PALIDROME PRECISION CHRONIC DIALYSIS CATHETER;  Surgeon: Leonie Douglas, MD;  Location: [facility] OR;  Service: Vascular;  Laterality: Right;   INSERTION OF DIALYSIS CATHETER Left 12/26/2021   Procedure: INSERTION OF TUNNELED  DIALYSIS CATHETER LEFT FEMORAL ARTERY;  Surgeon: Maeola Harman, MD;  Location: [facility] OR;  Service: Vascular;  Laterality: Left;   INSERTION OF DIALYSIS CATHETER Left 03/06/2022   Procedure: INSERTION OF DIALYSIS CATHETER;  Surgeon: Maeola Harman, MD;  Location: [facility] OR;  Service: Vascular;  Laterality: Left;   INSERTION OF DIALYSIS CATHETER Left 12/31/2022   Procedure: INSERTION OF DIALYSIS CATHETER LEFT GROIN;  Surgeon: Leonie Douglas, MD;  Location: [facility] OR;  Service: Vascular;  Laterality: Left;   IR FLUORO GUIDE CV LINE LEFT  04/23/2022   IR FLUORO GUIDE CV LINE LEFT  01/29/2023   IR FLUORO GUIDE CV LINE LEFT  02/02/2023   IR FLUORO GUIDE CV LINE LEFT  04/02/2023   IR FLUORO GUIDE CV LINE RIGHT  05/11/2018   IR FLUORO GUIDE CV LINE RIGHT  07/03/2021   IR FLUORO GUIDE CV LINE RIGHT  07/16/2021   IR PTA ADDL CENTRAL DIALYSIS SEG THRU DIALY CIRCUIT RIGHT Right 07/03/2021   IR PTA AND STENT ADDL CENTRAL DIALY SEG THRU DIALY CIRCUIT LEFT Left 02/02/2023   IR RADIOLOGIST EVAL & MGMT  04/08/2023   IR US GUIDE VASC ACCESS LEFT  04/23/2022   IR US GUIDE VASC ACCESS RIGHT  05/11/2018   IR VENO/EXT/UNI LEFT  04/23/2022   IR VENO/EXT/UNI LEFT  01/29/2023   IR VENOCAVAGRAM IVC  07/16/2021   IR  VENOCAVAGRAM IVC  02/02/2023   KIDNEY TRANSPLANT  2014   KNEE ARTHROSCOPY Left    LAPAROSCOPIC LYSIS OF ADHESIONS N/A 06/13/2022   Procedure: LAPAROSCOPIC LYSIS OF ADHESIONS;  Surgeon: Maeola Harman, MD;  Location: [facility] OR;  Service: Vascular;  Laterality: N/A;   POLYPECTOMY  05/20/2022   Procedure: POLYPECTOMY;  Surgeon: Shellia Cleverly, DO;  Location: [facility];  Service: Gastroenterology;;   THROMBECTOMY W/ EMBOLECTOMY Right 04/26/2021   Procedure: REMOVAL OF RIGHT THIGH ARTERIOVENOUS GORE-TEX GRAFT AND REPAIR OF RIGHT COMMON FEMORAL ARTERY;  Surgeon: Leonie Douglas, MD;  Location: [facility] OR;  Service: Vascular;  Laterality: Right;   UPPER EXTREMITY ANGIOGRAPHY Bilateral 05/13/2018   Procedure: UPPER EXTREMITY ANGIOGRAPHY - bilarteral;  Surgeon: Cephus Shelling, MD;  [facility]: [facility];  Service: Cardiovascular;  Laterality: Bilateral;    No Known Allergies  Prior to Admission medications   Medication Sig  Start Date End Date Taking? Authorizing Provider  apixaban (ELIQUIS) 2.5 MG TABS tablet Take 2.5 mg by mouth 2 (two) times daily. 06/18/23  Yes [provider]  aspirin EC 81 MG tablet Take 81 mg by mouth daily. 06/18/23 06/17/24 Yes [provider]  atorvastatin (LIPITOR) 40 MG tablet Take 1 tablet (40 mg total) by mouth daily. 02/20/23  Yes [name], MD  clopidogrel (PLAVIX) 75 MG tablet Take 75 mg by mouth daily.   Yes [provider]  ferric citrate (AURYXIA) 1 GM 210 MG(Fe) tablet Take 210 mg by mouth 3 (three) times daily with meals. 09/06/19  Yes [provider]  sodium zirconium cyclosilicate (LOKELMA) 10 g PACK packet Take 10 g by mouth See admin instructions. Take 10g by mouth once daily on non-dialysis days 04/30/23  Yes [provider]    Social History   Socioeconomic History   Marital status: Single    Spouse name: Not on file   Number of children: 0   Years of education: Not on file   Highest  education level: Not on file  Occupational History   Not on file  Tobacco Use   Smoking status: Former    Current packs/day: 0.00    Types: Cigarettes    Quit date: 11/08/2015    Years since quitting: 7.7   Smokeless tobacco: Never  Vaping Use   Vaping status: Never Used  Substance and Sexual Activity   Alcohol use: Not Currently   Drug use: Not Currently    Types: Marijuana    Comment: Last use was in 04/2022   Sexual activity: Yes  Other Topics Concern   Not on file  Social History Narrative   Lives alone.    Social Drivers of Health   Financial Resource Strain: Medium Risk (06/19/2023)   Received from [facility]   Overall Financial Resource Strain (CARDIA)    Difficulty of Paying Living Expenses: Somewhat hard  Food Insecurity: No Food Insecurity (07/27/2023)   Hunger Vital Sign    Worried About Running Out of Food in the Last Year: Never true    Ran Out of Food in the Last Year: Never true  Transportation Needs: No Transportation Needs (07/27/2023)   PRAPARE - Administrator, Civil Service (Medical): No    Lack of Transportation (Non-Medical): No  Physical Activity: Insufficiently Active (12/29/2022)   Exercise Vital Sign    Days of Exercise per Week: 3 days    Minutes of Exercise per Session: 30 min  Stress: No Stress Concern Present (12/29/2022)   [employer] of Occupational Health - Occupational Stress Questionnaire    Feeling of Stress : Not at all  Social Connections: Socially Isolated (12/29/2022)   Social Connection and Isolation Panel [NHANES]    Frequency of Communication with Friends and Family: More than three times a week    Frequency of Social Gatherings with Friends and Family: More than three times a week    Attends Religious Services: Never    Database administrator or Organizations: No    Attends Banker Meetings: Never    Marital Status: Never married  Intimate Partner Violence: Not At Risk (07/27/2023)    Humiliation, Afraid, Rape, and Kick questionnaire    Fear of Current or Ex-Partner: No    Emotionally Abused: No    Physically Abused: No    Sexually Abused: No    Family History  Problem Relation Age of Onset  Diabetes Father    Stroke Father    Stroke Maternal Grandmother    Anesthesia problems Neg Hx     ROS: Otherwise negative unless mentioned in HPI  Physical Examination  Vitals:   07/27/23 0517 07/27/23 0725  BP: 137/88 116/78  Pulse: 77 78  Resp: 18   Temp: 98.6 F (37 C) 98.5 F (36.9 C)  SpO2: 100% 99%   Body mass index is 37.53 kg/m.  General: no acute distress Cardiac: hemodynamically stable Pulm: normal work of breathing Neuro: alert, no focal deficit Extremities: Nonpalpable thrill in left arm graft, palpable left radial  ASSESSMENT/PLAN: This is a 48 y.o. male with ESRD on HD via a left hero graft.  We discussed the option attempted hero graft declot.  He reports he is not interested as he was told by [facility] to not let anyone else work on this graft.  He plans to go to [facility] for further evaluation early this week but is only interested in a catheter.  I explained given his significant central venous stenosis would likely only be a candidate for a groin catheter when she reports has had before.   -Given our OR schedule today please consult IR or [facility] for tunneled dialysis catheter placement. Patient can follow-up with the team regarding the rest of his HD access care   Daria Pastures MD [facility] [phone] 07/27/2023  8:55 AM

## 2023-07-27 NOTE — Plan of Care (Signed)
  Problem: Education: Goal: Knowledge of disease and its progression will improve 07/27/2023 0237 by Craig Staggers, RN Outcome: Progressing 07/27/2023 0236 by Craig Staggers, RN Outcome: Progressing Goal: Individualized Educational Video(s) 07/27/2023 0237 by Craig Staggers, RN Outcome: Progressing 07/27/2023 0236 by Craig Staggers, RN Outcome: Progressing   Problem: Fluid Volume: Goal: Compliance with measures to maintain balanced fluid volume will improve 07/27/2023 0237 by Craig Staggers, RN Outcome: Progressing 07/27/2023 0236 by Craig Staggers, RN Outcome: Progressing   Problem: Health Behavior/Discharge Planning: Goal: Ability to manage health-related needs will improve 07/27/2023 0237 by Craig Staggers, RN Outcome: Progressing 07/27/2023 0236 by Craig Staggers, RN Outcome: Progressing   Problem: Nutritional: Goal: Ability to make healthy dietary choices will improve 07/27/2023 0237 by Craig Staggers, RN Outcome: Progressing 07/27/2023 0236 by Craig Staggers, RN Outcome: Progressing   Problem: Clinical Measurements: Goal: Complications related to the disease process, condition or treatment will be avoided or minimized 07/27/2023 0237 by Craig Staggers, RN Outcome: Progressing 07/27/2023 0236 by Craig Staggers, RN Outcome: Progressing

## 2023-07-27 NOTE — Plan of Care (Signed)

## 2023-07-27 NOTE — Progress Notes (Signed)
[name] left AMA. Pt left without signing form. He stated he was frustrated with the wait for IP dialysis and will go to outpatient at 5:30 am.  Skin clean, dry and intact without evidence of skin break down, no evidence of skin tears noted. IV catheter discontinued intact. Site without signs and symptoms of complications. Dressing and pressure applied. Pt denies pain at the site currently. No complaints noted.  Patient free of lines, drains, and wounds.   Paulina Fusi, RN

## 2023-07-27 NOTE — Care Management Obs Status (Signed)
MEDICARE OBSERVATION STATUS NOTIFICATION   Patient Details  Name: [name] MRN: [id number] Date of Birth: Apr 23, 1976   Medicare Observation Status Notification Given:  Yes    Tom-Johnson, Hershal Coria, RN 07/27/2023, 4:39 PM

## 2023-07-27 NOTE — Consult Note (Addendum)
Chief Complaint: Patient was seen in consultation today for clotted AVG, no functioning HD access  Chief Complaint  Patient presents with   Vascular Access Problem   at the request of Delano Metz  Referring Physician(s): Delano Metz  Supervising Physician: Richarda Overlie  Patient Status: [facility] - In-pt  History of Present Illness: [name] is a 48 y.o. male with PMHs of A fib, SVC/IVC occlusion, failed renal transplant, ESRD with limited venous access, IR was consulted for tunneled HD cath placement.   Patient is well known to IR for previous HD cath placement/replacement and recanalization and stenting of occluded iliac vein. Patient was most recently seen by IR on 04/02/23 for exchange of left femoral HD cath. Since then, patient was seen by [facility] for a hero graft revision on 06/17/23. Patient states that the revised AVG was not accessed on 1/22/525 during f/u visit with [facility].   Patient presented to [facility] ED on 2/16 due to clot in his dialysis graft and inability to complete dialysis on Thursday. Patient was admitted and nephrology and vascular surgery were consulted, patient states that he does not want interventions done on his Hero graft but interested in tunneled HD cath placement. IR was consulted, will plan to place a left femoral tunneled HD cathter today.   Patient laying in bed, not in acute distress.  Denise headache, fever, chills, shortness of breath, cough, chest pain, abdominal pain, nausea ,vomiting, and bleeding. States that his veins has been "opened up a lot."    Past Medical History:  Diagnosis Date   Acute respiratory failure with hypoxia (HCC) 11/30/2018   Anemia    ESRD   ESRD on hemodialysis (HCC) 06/07/2011   TTS [place]. Started HD in 2006, got transplant in 2014 lasted until Dec 2019 then went back on HD.  Has L thigh AVG as of Jun 2020.  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    History of blood transfusion    Hypertension     03/19/22- has not had high blood pressure in 5 years.   Morbid obesity (HCC)    Paroxysmal atrial fibrillation (HCC)    Pre-diabetes    no meds   Sepsis (HCC) 11/30/2018   Sleep apnea    lost weight, does not use CPAP    Past Surgical History:  Procedure Laterality Date   ALLOGRAFT APPLICATION Right 02/01/2023   Procedure: ALLOGRAFT [facility];  Surgeon: Edwin Cap, DPM;  Location: [facility] OR;  Service: Orthopedics/Podiatry;  Laterality: Right;   AMPUTATION Right 12/12/2022   Procedure: AMPUTATION RAY;  Surgeon: Louann Sjogren, DPM;  Location: [facility] OR;  Service: Orthopedics/Podiatry;  Laterality: Right;  surgical team to do local block   APPLICATION OF WOUND VAC Right 02/01/2023   Procedure: APPLICATION OF WOUND VAC;  Surgeon: Edwin Cap, DPM;  Location: [facility] OR;  Service: Orthopedics/Podiatry;  Laterality: Right;   ARTERIOVENOUS GRAFT PLACEMENT  08/09/2010   Left Thigh Graft by Dr. Cari Caraway   AV FISTULA PLACEMENT Right 05/18/2018   Procedure: INSERTION OF ARTERIOVENOUS (AV) GORE-TEX GRAFT Left THIGH;  Surgeon: Maeola Harman, MD;  Location: [facility] OR;  Service: Vascular;  Laterality: Right;   AV FISTULA PLACEMENT Right 02/15/2021   Procedure: INSERTION OF ARTERIOVENOUS (AV) GORE-TEX LOOP GRAFT RIGHT THIGH;  Surgeon: Maeola Harman, MD;  Location: [facility] OR;  Service: Vascular;  Laterality: Right;   BIOPSY  05/20/2022   Procedure: BIOPSY;  Surgeon: Shellia Cleverly, DO;  Location: [facility];  Service: Gastroenterology;;   CAPD INSERTION N/A 06/13/2022   Procedure: LAPAROSCOPIC INSERTION CONTINUOUS AMBULATORY PERITONEAL DIALYSIS  (CAPD) CATHETER;  Surgeon: Maeola Harman, MD;  Location: [facility] OR;  Service: Vascular;  Laterality: N/A;   CAPD REMOVAL  05/08/2011   Procedure: CONTINUOUS AMBULATORY PERITONEAL DIALYSIS  (CAPD) CATHETER REMOVAL;  Surgeon: Iona Coach, MD;  Location: [facility] OR;  Service: General;  Laterality: N/A;  Removal of CAPD  catheter, Dr. requests to go after 100   CAPD REMOVAL N/A 08/08/2022   Procedure: REMOVAL CONTINUOUS AMBULATORY PERITONEAL DIALYSIS  (CAPD) CATHETER;  Surgeon: Maeola Harman, MD;  Location: [facility] OR;  Service: Vascular;  Laterality: N/A;   COLONOSCOPY WITH PROPOFOL N/A 05/20/2022   Procedure: COLONOSCOPY WITH PROPOFOL;  Surgeon: Shellia Cleverly, DO;  Location: [facility];  Service: Gastroenterology;  Laterality: N/A;   I & D EXTREMITY Right 12/08/2022   Procedure: IRRIGATION AND DEBRIDEMENT RIGHT FOOT;  Surgeon: Louann Sjogren, DPM;  Location: [facility] OR;  Service: Orthopedics/Podiatry;  Laterality: Right;   I & D EXTREMITY Right 02/01/2023   Procedure: IRRIGATION AND DEBRIDEMENT RIGHT FOOT;  Surgeon: Edwin Cap, DPM;  Location: [facility] OR;  Service: Orthopedics/Podiatry;  Laterality: Right;   INSERTION OF DIALYSIS CATHETER  10/05/2010   Right Femoral Cath insertion by Dr. Leonides Sake.  Pt ahas had several caths inserted.   INSERTION OF DIALYSIS CATHETER Right 02/15/2021   Procedure: Attempted INSERTION OF RIGHT and Left Internal Jugular DIALYSIS CATHETER, Insertion of Left Femoral Vein Dialysis Catheter;  Surgeon: Maeola Harman, MD;  Location: [facility] OR;  Service: Vascular;  Laterality: Right;   INSERTION OF DIALYSIS CATHETER Right 04/26/2021   Procedure: INSERTION OF TUNNELED 55cm PALIDROME PRECISION CHRONIC DIALYSIS CATHETER;  Surgeon: Leonie Douglas, MD;  Location[facility] OR;  Service: Vascular;  Laterality: Right;   INSERTION OF DIALYSIS CATHETER Left 12/26/2021   Procedure: INSERTION OF TUNNELED  DIALYSIS CATHETER LEFT FEMORAL ARTERY;  Surgeon: Maeola Harman, MD;  Location: [facility] OR;  Service: Vascular;  Laterality: Left;   INSERTION OF DIALYSIS CATHETER Left 03/06/2022   Procedure: INSERTION OF DIALYSIS CATHETER;  Surgeon: Maeola Harman, MD;  Location: [facility] OR;  Service: Vascular;  Laterality: Left;   INSERTION OF DIALYSIS CATHETER Left 12/31/2022   Procedure:  INSERTION OF DIALYSIS CATHETER LEFT GROIN;  Surgeon: Leonie Douglas, MD;  [facility] OR;  Service: Vascular;  Laterality: Left;   IR FLUORO GUIDE CV LINE LEFT  04/23/2022   IR FLUORO GUIDE CV LINE LEFT  01/29/2023   IR FLUORO GUIDE CV LINE LEFT  02/02/2023   IR FLUORO GUIDE CV LINE LEFT  04/02/2023   IR FLUORO GUIDE CV LINE RIGHT  05/11/2018   IR FLUORO GUIDE CV LINE RIGHT  07/03/2021   IR FLUORO GUIDE CV LINE RIGHT  07/16/2021   IR PTA ADDL CENTRAL DIALYSIS SEG THRU DIALY CIRCUIT RIGHT Right 07/03/2021   IR PTA AND STENT ADDL CENTRAL DIALY SEG THRU DIALY CIRCUIT LEFT Left 02/02/2023   IR RADIOLOGIST EVAL & MGMT  04/08/2023   IR US GUIDE VASC ACCESS LEFT  04/23/2022   IR US GUIDE VASC ACCESS RIGHT  05/11/2018   IR VENO/EXT/UNI LEFT  04/23/2022   IR VENO/EXT/UNI LEFT  01/29/2023   IR VENOCAVAGRAM IVC  07/16/2021   IR VENOCAVAGRAM IVC  02/02/2023   KIDNEY TRANSPLANT  2014   KNEE ARTHROSCOPY Left    LAPAROSCOPIC LYSIS OF ADHESIONS N/A 06/13/2022   Procedure: LAPAROSCOPIC LYSIS OF ADHESIONS;  Surgeon: Lemar Livings  Cristal Deer, MD;  Location: [facility] OR;  Service: Vascular;  Laterality: N/A;   POLYPECTOMY  05/20/2022   Procedure: POLYPECTOMY;  Surgeon: Shellia Cleverly, DO;  Location: [facility];  Service: Gastroenterology;;   THROMBECTOMY W/ EMBOLECTOMY Right 04/26/2021   Procedure: REMOVAL OF RIGHT THIGH ARTERIOVENOUS GORE-TEX GRAFT AND REPAIR OF RIGHT COMMON FEMORAL ARTERY;  Surgeon: Leonie Douglas, MD;  Location[facility] OR;  Service: Vascular;  Laterality: Right;   UPPER EXTREMITY ANGIOGRAPHY Bilateral 05/13/2018   Procedure: UPPER EXTREMITY ANGIOGRAPHY - bilarteral;  Surgeon: Cephus Shelling, MD;  [facility] INV[facility] CV [facility];  Service: Cardiovascular;  Laterality: Bilateral;    Allergies: Patient has no known allergies.  Medications: Prior to Admission medications   Medication Sig Start Date End Date Taking? Authorizing Provider  apixaban (ELIQUIS) 2.5 MG TABS tablet Take 2.5 mg by  mouth 2 (two) times daily. 06/18/23  Yes [provider]  aspirin EC 81 MG tablet Take 81 mg by mouth daily. 06/18/23 06/17/24 Yes [provider]  atorvastatin (LIPITOR) 40 MG tablet Take 1 tablet (40 mg total) by mouth daily. 02/20/23  Yes [name], MD  clopidogrel (PLAVIX) 75 MG tablet Take 75 mg by mouth daily.   Yes [provider]  ferric citrate (AURYXIA) 1 GM 210 MG(Fe) tablet Take 210 mg by mouth 3 (three) times daily with meals. 09/06/19  Yes [provider]  sodium zirconium cyclosilicate (LOKELMA) 10 g PACK packet Take 10 g by mouth See admin instructions. Take 10g by mouth once daily on non-dialysis days 04/30/23  Yes [provider]     Family History  Problem Relation Age of Onset   Diabetes Father    Stroke Father    Stroke Maternal Grandmother    Anesthesia problems Neg Hx     Social History   Socioeconomic History   Marital status: Single    Spouse name: Not on file   Number of children: 0   Years of education: Not on file   Highest education level: Not on file  Occupational History   Not on file  Tobacco Use   Smoking status: Former    Current packs/day: 0.00    Types: Cigarettes    Quit date: 11/08/2015    Years since quitting: 7.7   Smokeless tobacco: Never  Vaping Use   Vaping status: Never Used  Substance and Sexual Activity   Alcohol use: Not Currently   Drug use: Not Currently    Types: Marijuana    Comment: Last use was in 04/2022   Sexual activity: Yes  Other Topics Concern   Not on file  Social History Narrative   Lives alone.    Social Drivers of Health   Financial Resource Strain: Medium Risk (06/19/2023)   Received from [facility]   Overall Financial Resource Strain (CARDIA)    Difficulty of Paying Living Expenses: Somewhat hard  Food Insecurity: No Food Insecurity (07/27/2023)   Hunger Vital Sign    Worried About Running Out of Food in the Last Year: Never true    Ran Out  of Food in the Last Year: Never true  Transportation Needs: No Transportation Needs (07/27/2023)   PRAPARE - Administrator, Civil Service (Medical): No    Lack of Transportation (Non-Medical): No  Physical Activity: Insufficiently Active (12/29/2022)   Exercise Vital Sign    Days of Exercise per Week: 3 days    Minutes of Exercise per Session: 30 min  Stress:  No Stress Concern Present (12/29/2022)   [employer] of Occupational Health - Occupational Stress Questionnaire    Feeling of Stress : Not at all  Social Connections: Socially Isolated (12/29/2022)   Social Connection and Isolation Panel [NHANES]    Frequency of Communication with Friends and Family: More than three times a week    Frequency of Social Gatherings with Friends and Family: More than three times a week    Attends Religious Services: Never    Database administrator or Organizations: No    Attends Banker Meetings: Never    Marital Status: Never married     Review of Systems: A 12 point ROS discussed and pertinent positives are indicated in the HPI above.  All other systems are negative.  Vital Signs: BP 116/78 (BP Location: Right Arm)   Pulse 78   Temp 98.5 F (36.9 C) (Oral)   Resp 18   Ht 6\' 2"  (1.88 m)   Wt 292 lb 5.3 oz (132.6 kg)   SpO2 99%   BMI 37.53 kg/m    Physical Exam Vitals and nursing note reviewed.  Constitutional:      General: Patient is not in acute distress.    Appearance: Normal appearance. Patient is not ill-appearing.  HENT:     Head: Normocephalic and atraumatic.     Mouth/Throat:     Mouth: Mucous membranes are moist.     Pharynx: Oropharynx is clear.  Cardiovascular:     Rate and Rhythm: Normal rate and regular rhythm.     Pulses: Normal pulses.     Heart sounds: Normal heart sounds.  Pulmonary:     Effort: Pulmonary effort is normal.     Breath sounds: Normal breath sounds.  Abdominal:     General: Abdomen is flat. Bowel sounds are normal.      Palpations: Abdomen is soft.  Musculoskeletal:     Cervical back: Neck supple.  Skin:    General: Skin is warm and dry.     Coloration: Skin is not jaundiced or pale.  Neurological:     Mental Status: Patient is alert and oriented to person, place, and time.  Psychiatric:        Mood and Affect: Mood normal.        Behavior: Behavior normal.        Judgment: Judgment normal.    MD Evaluation Airway: WNL Heart: WNL Abdomen: WNL Chest/ Lungs: WNL ASA  Classification: 3 Mallampati/Airway Score: Two  Imaging: DG Chest 2 View Result Date: 07/26/2023 CLINICAL DATA:  missed dialysis. EXAM: CHEST - 2 VIEW COMPARISON:  04/02/2023. FINDINGS: Bilateral lung fields are clear. No pulmonary edema. Bilateral costophrenic angles are clear. Normal cardio-mediastinal silhouette. No acute osseous abnormalities. The soft tissues are within normal limits. Left IJ hemodialysis catheter noted with its tip overlying the cavoatrial junction region. IMPRESSION: No active cardiopulmonary disease. Electronically Signed   By: Jules Schick M.D.   On: 07/26/2023 13:01    Labs:  CBC: Recent Labs    04/05/23 0331 04/06/23 0452 07/26/23 1137 07/27/23 0405  WBC 5.9 7.0 4.1 3.8*  HGB 9.3* 9.8* 11.0* 10.3*  HCT 29.5* 31.7* 35.5* 32.1*  PLT 129* 175 205 195    COAGS: Recent Labs    01/27/23 1523 01/28/23 2054 02/02/23 0810 04/01/23 1700 04/02/23 0119 04/02/23 0418 04/03/23 0350  INR 1.6*  --  1.4* 1.9* 1.8*  --   --   APTT  --    < >  --  38* 38* 38* 33   < > = values in this interval not displayed.    BMP: Recent Labs    04/05/23 2147 04/06/23 0452 07/26/23 1137 07/27/23 0405  NA 131* 133* 139 141  K 5.0 4.7 5.2* 5.1  CL 97* 96* 99 100  CO2 23 21* 23 23  GLUCOSE 86 78 100* 88  BUN 65* 66* 81* 89*  CALCIUM 8.0* 8.3* 9.7 9.6  CREATININE 11.55* 12.42* 16.74* 17.99*  GFRNONAA 5* 5* 3* 3*    LIVER FUNCTION TESTS: Recent Labs    12/31/22 1302 01/27/23 1114 01/28/23 0630  01/30/23 0722 04/01/23 1633 04/02/23 0119 04/05/23 0030 04/05/23 2147 04/06/23 0452 07/26/23 1137  BILITOT 0.7 0.8 1.2  --  0.7  --   --   --   --   --   AST 11* 50* 34  --  17  --   --   --   --   --   ALT 9 53* 46*  --  14  --   --   --   --   --   ALKPHOS 56 149* 120  --  68  --   --   --   --   --   PROT 7.5 7.5 7.2  --  7.2  --   --   --   --   --   ALBUMIN 3.6 2.9* 2.9*   < > 3.3*   < > 2.9* 2.8* 2.8* 3.6   < > = values in this interval not displayed.    TUMOR MARKERS: No results for input(s): "AFPTM", "CEA", "CA199", "CHROMGRNA" in the last 8760 hours.  Assessment and Plan: 48 y.o. male with ESRD and limited venous access who presents for tunneled HD catheter placement.  NPO since MN VSS CBC stable  NKDA 2 g ancef during the procedure - singed and held   Risks and benefits discussed with the patient including, but not limited to bleeding, infection, vascular injury, pneumothorax which may require chest tube placement, air embolism or even death  All of the patient's questions were answered, patient is agreeable to proceed. Consent signed and in chart.  Thank you for this interesting consult.  I greatly enjoyed meeting NAMARI BRETON and look forward to participating in their care.  A copy of this report was sent to the requesting provider on this date.  Electronically Signed: Willette Brace, PA-C 07/27/2023, 10:39 AM   I spent a total of 20 Minutes    in face to face in clinical consultation, greater than 50% of which was counseling/coordinating care for tunneled HD cath placement.   This chart was dictated using voice recognition software.  Despite best efforts to proofread,  errors can occur which can change the documentation meaning.

## 2023-07-27 NOTE — TOC CM/SW Note (Signed)
Transition of Care [facility]) - Inpatient Brief Assessment   Patient Details  Name: UZIAH SORTER MRN: [id number] Date of Birth: 1976-06-09  Transition of Care [facility]) CM/SW Contact:    Tom-Johnson, Hershal Coria, RN Phone Number: 07/27/2023, 4:41 PM   Clinical Narrative:  Patient presented to the ED with occluded Vascular Access.  Patient underwent placement of new Lt Femoral HD Catheter and Balloon dilatation of Lt Iliac Veins today 07/27/23.   Patient is on TTS outpatient schedule, drives self to and from HD. Nephrology following for inpatient HD.  No TOC needs or recommendations noted at this time.  Patient not Medically ready for discharge.  CM will continue to follow as patient progresses with care towards discharge.             Transition of Care Asessment: Insurance and Status: Insurance coverage has been reviewed Patient has primary care physician: Yes Home environment has been reviewed: Yes Prior level of function:: Independent Prior/Current Home Services: No current home services Social Drivers of Health Review: SDOH reviewed no interventions necessary Readmission risk has been reviewed: Yes Transition of care needs: no transition of care needs at this time

## 2023-07-27 NOTE — Progress Notes (Signed)
[facility]                                                                              [employer], is a 48 y.o. male, DOB - [birth year], ZOX:[id number] Admit date - 07/26/2023    Outpatient Primary MD for the patient is Rudd, Bertram Millard, MD  LOS - 0  days  Chief Complaint  Patient presents with   Vascular Access Problem       Brief summary   Patient is a 48 year old male with ESRD on hemodialysis TTS, HTN, PAF, T2DM, HLD, failed renal transplant presented to ED due to clot in his dialysis graft and inability to complete dialysis on Thursday. Last full session was Tuesday, 07/21/2023. [facility] and never heard back from them on Thursday and then was told to come to ED.   He was [facility] at [facility] on 06/17/23 for a left hero revision by Dr. Harlon Flor. At f/u on 1/22 with [facility] no one had tried to use his graft.  11/13: IVC recanalization with stenting of IVC and right iliac veins 12/11: SVC recancalization with left neck transmediastinal CVC placement and left femoral perm cath exchange  Patient has longstanding history of HD access problems   Assessment & Plan    Principal Problem:   Problem with vascular access in ESRD on dialysis, clotted LUA HeRO AV graft  ESRD on hemodialysis TTS -  Vascular surgery was consulted, recommended tunneled dialysis catheter placement, had recommended option of hero graft declot however patient does not want any intervention on the graft. -IR consulted by nephrology -N.p.o. for possible [facility] placement today  Active Problems:   Paroxysmal atrial fibrillation (HCC) -Currently rate controlled, normal sinus rhythm -Eliquis is on hold for procedure   Hyperlipidemia Continue lipitor 40mg     S/P kidney transplant Failed transplant    Type 2 diabetes mellitus with other diabetic kidney complication (HCC) -A1C of 5.2 in 02/2023  -Carb modified diet    Hypotension -Continue midodrine   Hyperkalemia -Lokelma x 1   Obesity  class II Estimated body mass index is 37.53 kg/m as calculated from the following:   Height as of this encounter: 6\' 2"  (1.88 m).   Weight as of this encounter: 132.6 kg.  Code Status: Full code DVT Prophylaxis:  SCDs Start: 07/26/23 1544   Level of Care: Level of care: Telemetry Medical Family Communication: Updated patient Disposition Plan:      Remains inpatient appropriate:      Procedures:    Consultants:   Nephrology IR Vascular surgery  Antimicrobials:   Anti-infectives (From admission, onward)    None          Medications  aspirin EC  81 mg Oral Daily   atorvastatin  40 mg Oral Daily   Chlorhexidine Gluconate Cloth  6 each Topical Q0600   Chlorhexidine Gluconate Cloth  6 each Topical Q0600   clopidogrel  75 mg Oral Daily   [START ON 07/28/2023] doxercalciferol  6 mcg Intravenous Q T,Th,Sa-HD   ferric citrate  210 mg Oral TID WC   midodrine  15 mg Oral TID WC   mupirocin ointment  Nasal BID      Subjective:   [name] was seen and examined today.  Awaiting TDC placement.  No acute complaints.  Patient denies dizziness, chest pain, shortness of breath, abdominal pain, N/V. Objective:   Vitals:   07/26/23 1954 07/26/23 2027 07/27/23 0517 07/27/23 0725  BP: (!) 141/76 (!) 143/96 137/88 116/78  Pulse: 85 72 77 78  Resp: 17 20 18    Temp: 98.5 F (36.9 C) 97.7 F (36.5 C) 98.6 F (37 C) 98.5 F (36.9 C)  TempSrc: Oral Oral  Oral  SpO2: 100% 100% 100% 99%  Weight:      Height:       No intake or output data in the 24 hours ending 07/27/23 1040   Wt Readings from Last 3 Encounters:  07/26/23 132.6 kg  04/08/23 132.6 kg  04/05/23 132.6 kg     Exam General: Alert and oriented x 3, NAD Cardiovascular: S1 S2 auscultated,  RRR Respiratory: Clear to auscultation bilaterally, no wheezing Gastrointestinal: Soft, nontender, nondistended, + bowel sounds Ext: no pedal edema bilaterally Neuro: no new deficits Psych: Normal affect      Data Reviewed:  I have personally reviewed following labs    CBC Lab Results  Component Value Date   WBC 3.8 (L) 07/27/2023   RBC 3.53 (L) 07/27/2023   HGB 10.3 (L) 07/27/2023   HCT 32.1 (L) 07/27/2023   MCV 90.9 07/27/2023   MCH 29.2 07/27/2023   PLT 195 07/27/2023   MCHC 32.1 07/27/2023   RDW 17.4 (H) 07/27/2023   LYMPHSABS 0.9 07/26/2023   MONOABS 0.5 07/26/2023   EOSABS 0.2 07/26/2023   BASOSABS 0.0 07/26/2023     Last metabolic panel Lab Results  Component Value Date   NA 141 07/27/2023   K 5.1 07/27/2023   CL 100 07/27/2023   CO2 23 07/27/2023   BUN 89 (H) 07/27/2023   CREATININE 17.99 (H) 07/27/2023   GLUCOSE 88 07/27/2023   GFRNONAA 3 (L) 07/27/2023   GFRAA 5 (L) 12/16/2018   CALCIUM 9.6 07/27/2023   PHOS 5.4 (H) 07/26/2023   PROT 7.2 04/01/2023   ALBUMIN 3.6 07/26/2023   BILITOT 0.7 04/01/2023   ALKPHOS 68 04/01/2023   AST 17 04/01/2023   ALT 14 04/01/2023   ANIONGAP 18 (H) 07/27/2023    CBG (last 3)  No results for input(s): "GLUCAP" in the last 72 hours.    Coagulation Profile: No results for input(s): "INR", "PROTIME" in the last 168 hours.   Radiology Studies: I have personally reviewed the imaging studies  DG Chest 2 View Result Date: 07/26/2023 CLINICAL DATA:  missed dialysis. EXAM: CHEST - 2 VIEW COMPARISON:  04/02/2023. FINDINGS: Bilateral lung fields are clear. No pulmonary edema. Bilateral costophrenic angles are clear. Normal cardio-mediastinal silhouette. No acute osseous abnormalities. The soft tissues are within normal limits. Left IJ hemodialysis catheter noted with its tip overlying the cavoatrial junction region. IMPRESSION: No active cardiopulmonary disease. Electronically Signed   By: Jules Schick M.D.   On: 07/26/2023 13:01       Mylinh Cragg M.D. [facility] 07/27/2023, 10:40 AM  Available via Epic secure chat 7am-7pm After 7 pm, please refer to night coverage provider listed on amion.

## 2023-07-27 NOTE — Procedures (Signed)
Interventional Radiology Procedure:   Indications: ESRD and needs access for hemodialysis.  HeRO graft is occluded.  Recently removed left femoral catheter  Procedure: 1) Placement of new left femoral HD catheter 2) Balloon dilatation of left iliac veins  Findings: No patent veins identified in either groin with ultrasound.  Recannulization of old left femoral catheter site and venography confirmed placement in central veins.  IVC is stented and patent.  Left iliac veins severely stenotic and could not advance a HD catheter through iliac veins.  Therefore, left iliac veins were dilated up to 12 mm and advanced a new 55 cm Palindrome over a wire into right atrium.  Both lumens work well.  Complications: None     EBL: Minimal  Plan: HD catheter is ready to use.   Armonte Tortorella R. Lowella Dandy, MD  Pager: [phone]

## 2023-07-28 ENCOUNTER — Telehealth: Payer: Self-pay | Admitting: Nurse Practitioner

## 2023-07-28 DIAGNOSIS — N2581 Secondary hyperparathyroidism of renal origin: Secondary | ICD-10-CM | POA: Diagnosis not present

## 2023-07-28 DIAGNOSIS — Z992 Dependence on renal dialysis: Secondary | ICD-10-CM | POA: Diagnosis not present

## 2023-07-28 DIAGNOSIS — N186 End stage renal disease: Secondary | ICD-10-CM | POA: Diagnosis not present

## 2023-07-28 LAB — HEPATITIS B SURFACE ANTIBODY, QUANTITATIVE: Hep B S AB Quant (Post): 66.9 m[IU]/mL

## 2023-07-28 NOTE — Telephone Encounter (Signed)
Transition of Care - Initial Contact from Inpatient Facility  Date of discharge: 07/27/2023 Date of contact: 07/28/2023 Method: Phone Spoke to: Patient  Patient contacted to discuss transition of care from recent inpatient hospitalization. Patient was admitted to [facility] from 02/16-02/17/2025 with discharge diagnosis of failed HERO Graft The discharge medication list was reviewed. Patient understands the changes and has no concerns.   Patient will return to his/her outpatient HD unit on: 07/28/2023. Patient tells me that he had no issues with TDC.   No other concerns at this time.

## 2023-07-28 NOTE — Discharge Planning (Signed)
[facility] Patient Discharge Orders- [facility] CLINIC: [facility]   Patient's name: [name] [facility] Admit/DC Dates: 07/26/2023 - 07/27/2023  Discharge Diagnoses: Failed HERO graft   Placement of femoral TDC Left AMA  Aranesp: Given: no   Date and amount of last dose: NA  Last Hgb: 10 PRBC's Given: NA Date/# of units: NA ESA dose for discharge: Per anemia protocol IV Iron dose at discharge: Per anemia protocol  Heparin change: NA  EDW Change: NA New EDW: NA  Bath Change: NA  Access intervention/Change: Placement femoral TDC Details: Note has been sent  Hectorol/Calcitriol change: NA  Discharge Labs: Calcium 9.6 Phosphorus 5.4 Albumin 3.6 K+ 5.1  IV Antibiotics: NA Details:  On Coumadin?: NA Last INR: Next INR: Managed By:   OTHER/APPTS/LAB ORDERS:    D/C Meds to be reconciled by nurse after every discharge.  Completed By: Alonna Buckler [facility] [phone]   Reviewed by: MD:______ RN_______

## 2023-07-28 NOTE — Discharge Summary (Signed)
AMA NOTE    Patient ID: [name] MRN: [id number] DOB/AGE: [birth year] 48 y.o.  Admit date: 07/26/2023 Discharge date: 07/28/2023   PLEASE NOTE THAT PATIENT LEFT AGAINST MEDICAL ADVICE.   Primary Care Physician:  [name], MD  Discharge Diagnoses:    Problem with vascular access in ESRD on dialysis/clotted LUA HeRO AV graft  Type 2 diabetes mellitus with other diabetic kidney complication (HCC)  Paroxysmal atrial fibrillation (HCC)  Hyperlipidemia   ESRD on dialysis  Hypotension   Consults: Nephrology, vascular surgery, interventional radiology   Recommendations for Outpatient Follow-up:  Patient left AMA   DIET:     Allergies:  No Known Allergies   Discharge Medications: Please note that patient left AMA (against medical advice)   Brief H and P: For complete details please refer to admission H and P, but in brief Patient is a 48 year old male with ESRD on hemodialysis TTS, HTN, PAF, T2DM, HLD, failed renal transplant presented to ED due to clot in his dialysis graft and inability to complete dialysis on Thursday. Last full session was Tuesday, 07/21/2023. He called [facility] and never heard back from them on Thursday and then was told to come to ED.   He was seen at [facility] on 06/17/23 for a left hero revision by Dr. Harlon Flor. At f/u on 1/22 with [facility] no one had tried to use his graft.  11/13: IVC recanalization with stenting of IVC and right iliac veins 12/11: SVC recancalization with left neck transmediastinal CVC placement and left femoral perm cath exchange  Patient has longstanding history of HD access problems  Hospital Course:   Problem with vascular access in ESRD on dialysis, clotted LUA HeRO AV graft  ESRD on hemodialysis TTS -  Vascular surgery was consulted, recommended tunneled dialysis catheter placement, had recommended option of hero graft declot however patient does not want any intervention on the graft. -Vascular surgery, nephrology, IR  was consulted -Patient underwent placement of new left femoral HD catheter on 07/27/2023 -However he got frustrated with the wait for inpatient dialysis after HD cath placement and left AMA without signing the form.   Paroxysmal atrial fibrillation (HCC) -Normal sinus rhythm -Eliquis was placed on hold for the procedure   Hyperlipidemia Continue lipitor 40mg     S/P kidney transplant Failed transplant    Type 2 diabetes mellitus with other diabetic kidney complication (HCC) -A1C of 5.2 in 02/2023  -Carb modified diet    Hypotension -Continue midodrine   Hyperkalemia -Received Lokelma  Obesity class II Estimated body mass index is 37.53 kg/m as calculated from the following:   Height as of this encounter: 6\' 2"  (1.88 m).   Weight as of this encounter: 132.6 kg.  Day of Discharge Patient left AMA   The results of significant diagnostics from this hospitalization (including imaging, microbiology, ancillary and laboratory) are listed below for reference.    LAB RESULTS: Basic Metabolic Panel: Recent Labs  Lab 07/26/23 1137 07/27/23 0405  NA 139 141  K 5.2* 5.1  CL 99 100  CO2 23 23  GLUCOSE 100* 88  BUN 81* 89*  CREATININE 16.74* 17.99*  CALCIUM 9.7 9.6  PHOS 5.4*  --    Liver Function Tests: Recent Labs  Lab 07/26/23 1137  ALBUMIN 3.6   No results for input(s): "LIPASE", "AMYLASE" in the last 168 hours. No results for input(s): "AMMONIA" in the last 168 hours. CBC: Recent Labs  Lab 07/26/23 1137 07/27/23 0405  WBC 4.1 3.8*  NEUTROABS 2.4  --  HGB 11.0* 10.3*  HCT 35.5* 32.1*  MCV 94.2 90.9  PLT 205 195   Cardiac Enzymes: No results for input(s): "CKTOTAL", "CKMB", "CKMBINDEX", "TROPONINI" in the last 168 hours. BNP: Invalid input(s): "POCBNP" CBG: No results for input(s): "GLUCAP" in the last 168 hours.  Significant Diagnostic Studies:  IR PTA VENOUS EXCEPT DIALYSIS CIRCUIT Result Date: 07/28/2023 INDICATION: 48 year old with end-stage renal  disease and poor venous access. Upper extremity HeRO graft is occluded. Request for placement of the new femoral dialysis catheter. EXAM: 1. Placement of tunneled left femoral dialysis catheter 2. IVC venogram 3. Balloon angioplasty of left common and left external iliac veins 4. Ultrasound evaluation of both groins MEDICATIONS: Ancef 2 g; The antibiotic was administered within an appropriate time interval prior to skin puncture. ANESTHESIA/SEDATION: Moderate (conscious) sedation was employed during this procedure. A total of Versed 2.0 mg and Fentanyl 100 mcg was administered intravenously by the radiology nurse. Total intra-service moderate Sedation Time: 50 minutes. The patient's level of consciousness and vital signs were monitored continuously by radiology nursing throughout the procedure under my direct supervision. FLUOROSCOPY: Radiation Exposure Index (as provided by the fluoroscopic device): 279 mGy Kerma CONTRAST:  30 mL Omnipaque 300 COMPLICATIONS: None immediate. PROCEDURE: Informed written consent was obtained from the patient after a thorough discussion of the procedural risks, benefits and alternatives. All questions were addressed. Maximal Sterile Barrier Technique was utilized including caps, mask, sterile gowns, sterile gloves, sterile drape, hand hygiene and skin antiseptic. A timeout was performed prior to the initiation of the procedure. Patient has known upper extremity venous occlusions with a left upper extremity HeRO graft. Previous recanalization and stenting the IVC at [facility]. Ultrasound was used to evaluate both thighs. Small femoral veins in the upper right thigh identified. No significant femoral veins identified in the left thigh. The old left femoral dialysis track was evaluated. The old dialysis track skin site was anesthetized with 1% lidocaine. Small incision was made at the old skin site. Kumpe catheter was successfully advanced through the old track into the left iliac  venous system using a Glidewire. Contrast injection confirmed placement within the venous system. Diffuse narrowing of the left iliac veins. Catheter was advanced into the IVC and IVC venogram confirmed patency of the IVC stent. Contrast rapidly drains into the right atrium. Stiff Glidewire was advanced into the right atrium and a the peel-away sheath was placed. The catheter would not advanced through the peel-away sheath due to the stenosis in left iliac veins. Therefore, the peel-away sheath was exchanged for an 8 [place] sheath. Superstiff Amplatz wire was advanced into the right atrium. The left common and left external iliac veins were angioplastied with 8 mm and 12 mm Conquest balloons. Follow-up venography demonstrated slightly improved flow in the left iliac veins. 8 French sheath was exchanged for the dialysis peel-away sheath. A 55 cm tip to cuff Palindrome catheter was successfully advanced over the wire and the tip was placed in the right atrium. The cuff was placed underneath the skin. Both lumens aspirated and flushed well. Appropriate amount of heparin was placed in both lumens. Catheter was secured to skin with suture. Dressings were placed. FINDINGS: Ultrasound was used to evaluate both groins. Could not identify patent common femoral veins. Small femoral veins identified in the proximal right thigh. Access was obtained through the old left femoral subcutaneous tract. Patient has a stent in the left common iliac vein and stents in right iliac venous system. Overlapping stents in the IVC. IVC stent  is patent. Diffuse narrowing throughout the left iliac veins was treated with balloon angioplasty in order to place the dialysis catheter. Catheter tip is in the right atrium. The tip of the HeRO graft is noted near the superior cavoatrial junction. IMPRESSION: 1. Successful placement of a new tunneled left femoral dialysis catheter. Access was obtained through the old femoral subcutaneous tract. 55 cm  Palindrome dialysis catheter is present with the tip in the right atrium. 2. Diffuse narrowing throughout the left iliac veins. Left iliac veins were angioplastied up to 12 mm. 3. IVC stents are patent. Electronically Signed   By: Richarda Overlie M.D.   On: 07/28/2023 08:14   IR Venocavagram Ivc Result Date: 07/28/2023 INDICATION: 48 year old with end-stage renal disease and poor venous access. Upper extremity HeRO graft is occluded. Request for placement of the new femoral dialysis catheter. EXAM: 1. Placement of tunneled left femoral dialysis catheter 2. IVC venogram 3. Balloon angioplasty of left common and left external iliac veins 4. Ultrasound evaluation of both groins MEDICATIONS: Ancef 2 g; The antibiotic was administered within an appropriate time interval prior to skin puncture. ANESTHESIA/SEDATION: Moderate (conscious) sedation was employed during this procedure. A total of Versed 2.0 mg and Fentanyl 100 mcg was administered intravenously by the radiology nurse. Total intra-service moderate Sedation Time: 50 minutes. The patient's level of consciousness and vital signs were monitored continuously by radiology nursing throughout the procedure under my direct supervision. FLUOROSCOPY: Radiation Exposure Index (as provided by the fluoroscopic device): 279 mGy Kerma CONTRAST:  30 mL Omnipaque 300 COMPLICATIONS: None immediate. PROCEDURE: Informed written consent was obtained from the patient after a thorough discussion of the procedural risks, benefits and alternatives. All questions were addressed. Maximal Sterile Barrier Technique was utilized including caps, mask, sterile gowns, sterile gloves, sterile drape, hand hygiene and skin antiseptic. A timeout was performed prior to the initiation of the procedure. Patient has known upper extremity venous occlusions with a left upper extremity HeRO graft. Previous recanalization and stenting the IVC at [facility]. Ultrasound was used to evaluate both thighs.  Small femoral veins in the upper right thigh identified. No significant femoral veins identified in the left thigh. The old left femoral dialysis track was evaluated. The old dialysis track skin site was anesthetized with 1% lidocaine. Small incision was made at the old skin site. Kumpe catheter was successfully advanced through the old track into the left iliac venous system using a Glidewire. Contrast injection confirmed placement within the venous system. Diffuse narrowing of the left iliac veins. Catheter was advanced into the IVC and IVC venogram confirmed patency of the IVC stent. Contrast rapidly drains into the right atrium. Stiff Glidewire was advanced into the right atrium and a the peel-away sheath was placed. The catheter would not advanced through the peel-away sheath due to the stenosis in left iliac veins. Therefore, the peel-away sheath was exchanged for an 8 [place] sheath. Superstiff Amplatz wire was advanced into the right atrium. The left common and left external iliac veins were angioplastied with 8 mm and 12 mm Conquest balloons. Follow-up venography demonstrated slightly improved flow in the left iliac veins. 8 French sheath was exchanged for the dialysis peel-away sheath. A 55 cm tip to cuff Palindrome catheter was successfully advanced over the wire and the tip was placed in the right atrium. The cuff was placed underneath the skin. Both lumens aspirated and flushed well. Appropriate amount of heparin was placed in both lumens. Catheter was secured to skin with suture. Dressings were  placed. FINDINGS: Ultrasound was used to evaluate both groins. Could not identify patent common femoral veins. Small femoral veins identified in the proximal right thigh. Access was obtained through the old left femoral subcutaneous tract. Patient has a stent in the left common iliac vein and stents in right iliac venous system. Overlapping stents in the IVC. IVC stent is patent. Diffuse narrowing throughout the  left iliac veins was treated with balloon angioplasty in order to place the dialysis catheter. Catheter tip is in the right atrium. The tip of the HeRO graft is noted near the superior cavoatrial junction. IMPRESSION: 1. Successful placement of a new tunneled left femoral dialysis catheter. Access was obtained through the old femoral subcutaneous tract. 55 cm Palindrome dialysis catheter is present with the tip in the right atrium. 2. Diffuse narrowing throughout the left iliac veins. Left iliac veins were angioplastied up to 12 mm. 3. IVC stents are patent. Electronically Signed   By: Richarda Overlie M.D.   On: 07/28/2023 08:14   IR Fluoro Guide CV Line Left Result Date: 07/28/2023 INDICATION: 48 year old with end-stage renal disease and poor venous access. Upper extremity HeRO graft is occluded. Request for placement of the new femoral dialysis catheter. EXAM: 1. Placement of tunneled left femoral dialysis catheter 2. IVC venogram 3. Balloon angioplasty of left common and left external iliac veins 4. Ultrasound evaluation of both groins MEDICATIONS: Ancef 2 g; The antibiotic was administered within an appropriate time interval prior to skin puncture. ANESTHESIA/SEDATION: Moderate (conscious) sedation was employed during this procedure. A total of Versed 2.0 mg and Fentanyl 100 mcg was administered intravenously by the radiology nurse. Total intra-service moderate Sedation Time: 50 minutes. The patient's level of consciousness and vital signs were monitored continuously by radiology nursing throughout the procedure under my direct supervision. FLUOROSCOPY: Radiation Exposure Index (as provided by the fluoroscopic device): 279 mGy Kerma CONTRAST:  30 mL Omnipaque 300 COMPLICATIONS: None immediate. PROCEDURE: Informed written consent was obtained from the patient after a thorough discussion of the procedural risks, benefits and alternatives. All questions were addressed. Maximal Sterile Barrier Technique was utilized  including caps, mask, sterile gowns, sterile gloves, sterile drape, hand hygiene and skin antiseptic. A timeout was performed prior to the initiation of the procedure. Patient has known upper extremity venous occlusions with a left upper extremity HeRO graft. Previous recanalization and stenting the IVC at [facility]. Ultrasound was used to evaluate both thighs. Small femoral veins in the upper right thigh identified. No significant femoral veins identified in the left thigh. The old left femoral dialysis track was evaluated. The old dialysis track skin site was anesthetized with 1% lidocaine. Small incision was made at the old skin site. Kumpe catheter was successfully advanced through the old track into the left iliac venous system using a Glidewire. Contrast injection confirmed placement within the venous system. Diffuse narrowing of the left iliac veins. Catheter was advanced into the IVC and IVC venogram confirmed patency of the IVC stent. Contrast rapidly drains into the right atrium. Stiff Glidewire was advanced into the right atrium and a the peel-away sheath was placed. The catheter would not advanced through the peel-away sheath due to the stenosis in left iliac veins. Therefore, the peel-away sheath was exchanged for an 8 [place] sheath. Superstiff Amplatz wire was advanced into the right atrium. The left common and left external iliac veins were angioplastied with 8 mm and 12 mm Conquest balloons. Follow-up venography demonstrated slightly improved flow in the left iliac veins. 8 [place]  sheath was exchanged for the dialysis peel-away sheath. A 55 cm tip to cuff Palindrome catheter was successfully advanced over the wire and the tip was placed in the right atrium. The cuff was placed underneath the skin. Both lumens aspirated and flushed well. Appropriate amount of heparin was placed in both lumens. Catheter was secured to skin with suture. Dressings were placed. FINDINGS: Ultrasound was used to  evaluate both groins. Could not identify patent common femoral veins. Small femoral veins identified in the proximal right thigh. Access was obtained through the old left femoral subcutaneous tract. Patient has a stent in the left common iliac vein and stents in right iliac venous system. Overlapping stents in the IVC. IVC stent is patent. Diffuse narrowing throughout the left iliac veins was treated with balloon angioplasty in order to place the dialysis catheter. Catheter tip is in the right atrium. The tip of the HeRO graft is noted near the superior cavoatrial junction. IMPRESSION: 1. Successful placement of a new tunneled left femoral dialysis catheter. Access was obtained through the old femoral subcutaneous tract. 55 cm Palindrome dialysis catheter is present with the tip in the right atrium. 2. Diffuse narrowing throughout the left iliac veins. Left iliac veins were angioplastied up to 12 mm. 3. IVC stents are patent. Electronically Signed   By: Richarda Overlie M.D.   On: 07/28/2023 08:14   IR US Guide Vasc Access Left Result Date: 07/28/2023 INDICATION: 48 year old with end-stage renal disease and poor venous access. Upper extremity HeRO graft is occluded. Request for placement of the new femoral dialysis catheter. EXAM: 1. Placement of tunneled left femoral dialysis catheter 2. IVC venogram 3. Balloon angioplasty of left common and left external iliac veins 4. Ultrasound evaluation of both groins MEDICATIONS: Ancef 2 g; The antibiotic was administered within an appropriate time interval prior to skin puncture. ANESTHESIA/SEDATION: Moderate (conscious) sedation was employed during this procedure. A total of Versed 2.0 mg and Fentanyl 100 mcg was administered intravenously by the radiology nurse. Total intra-service moderate Sedation Time: 50 minutes. The patient's level of consciousness and vital signs were monitored continuously by radiology nursing throughout the procedure under my direct supervision.  FLUOROSCOPY: Radiation Exposure Index (as provided by the fluoroscopic device): 279 mGy Kerma CONTRAST:  30 mL Omnipaque 300 COMPLICATIONS: None immediate. PROCEDURE: Informed written consent was obtained from the patient after a thorough discussion of the procedural risks, benefits and alternatives. All questions were addressed. Maximal Sterile Barrier Technique was utilized including caps, mask, sterile gowns, sterile gloves, sterile drape, hand hygiene and skin antiseptic. A timeout was performed prior to the initiation of the procedure. Patient has known upper extremity venous occlusions with a left upper extremity HeRO graft. Previous recanalization and stenting the IVC at [facility]. Ultrasound was used to evaluate both thighs. Small femoral veins in the upper right thigh identified. No significant femoral veins identified in the left thigh. The old left femoral dialysis track was evaluated. The old dialysis track skin site was anesthetized with 1% lidocaine. Small incision was made at the old skin site. Kumpe catheter was successfully advanced through the old track into the left iliac venous system using a Glidewire. Contrast injection confirmed placement within the venous system. Diffuse narrowing of the left iliac veins. Catheter was advanced into the IVC and IVC venogram confirmed patency of the IVC stent. Contrast rapidly drains into the right atrium. Stiff Glidewire was advanced into the right atrium and a the peel-away sheath was placed. The catheter would not advanced through  the peel-away sheath due to the stenosis in left iliac veins. Therefore, the peel-away sheath was exchanged for an 8 [place] sheath. Superstiff Amplatz wire was advanced into the right atrium. The left common and left external iliac veins were angioplastied with 8 mm and 12 mm Conquest balloons. Follow-up venography demonstrated slightly improved flow in the left iliac veins. 8 French sheath was exchanged for the dialysis  peel-away sheath. A 55 cm tip to cuff Palindrome catheter was successfully advanced over the wire and the tip was placed in the right atrium. The cuff was placed underneath the skin. Both lumens aspirated and flushed well. Appropriate amount of heparin was placed in both lumens. Catheter was secured to skin with suture. Dressings were placed. FINDINGS: Ultrasound was used to evaluate both groins. Could not identify patent common femoral veins. Small femoral veins identified in the proximal right thigh. Access was obtained through the old left femoral subcutaneous tract. Patient has a stent in the left common iliac vein and stents in right iliac venous system. Overlapping stents in the IVC. IVC stent is patent. Diffuse narrowing throughout the left iliac veins was treated with balloon angioplasty in order to place the dialysis catheter. Catheter tip is in the right atrium. The tip of the HeRO graft is noted near the superior cavoatrial junction. IMPRESSION: 1. Successful placement of a new tunneled left femoral dialysis catheter. Access was obtained through the old femoral subcutaneous tract. 55 cm Palindrome dialysis catheter is present with the tip in the right atrium. 2. Diffuse narrowing throughout the left iliac veins. Left iliac veins were angioplastied up to 12 mm. 3. IVC stents are patent. Electronically Signed   By: Richarda Overlie M.D.   On: 07/28/2023 08:14   DG Chest 2 View Result Date: 07/26/2023 CLINICAL DATA:  missed dialysis. EXAM: CHEST - 2 VIEW COMPARISON:  04/02/2023. FINDINGS: Bilateral lung fields are clear. No pulmonary edema. Bilateral costophrenic angles are clear. Normal cardio-mediastinal silhouette. No acute osseous abnormalities. The soft tissues are within normal limits. Left IJ hemodialysis catheter noted with its tip overlying the cavoatrial junction region. IMPRESSION: No active cardiopulmonary disease. Electronically Signed   By: Jules Schick M.D.   On: 07/26/2023 13:01    2D  ECHO:   Disposition and Follow-up:    DISPOSITION: Patient left AMA. He was advised to seek follow-up with primary care physician.     DISCHARGE FOLLOW-UP Patient left AMA     Signed:   Thad Ranger M.D. [facility] 07/28/2023, 3:22 PM

## 2023-07-30 DIAGNOSIS — N2581 Secondary hyperparathyroidism of renal origin: Secondary | ICD-10-CM | POA: Diagnosis not present

## 2023-07-30 DIAGNOSIS — Z992 Dependence on renal dialysis: Secondary | ICD-10-CM | POA: Diagnosis not present

## 2023-07-30 DIAGNOSIS — N186 End stage renal disease: Secondary | ICD-10-CM | POA: Diagnosis not present

## 2023-08-01 DIAGNOSIS — Z992 Dependence on renal dialysis: Secondary | ICD-10-CM | POA: Diagnosis not present

## 2023-08-01 DIAGNOSIS — N2581 Secondary hyperparathyroidism of renal origin: Secondary | ICD-10-CM | POA: Diagnosis not present

## 2023-08-01 DIAGNOSIS — N186 End stage renal disease: Secondary | ICD-10-CM | POA: Diagnosis not present

## 2023-08-04 DIAGNOSIS — N2581 Secondary hyperparathyroidism of renal origin: Secondary | ICD-10-CM | POA: Diagnosis not present

## 2023-08-04 DIAGNOSIS — N186 End stage renal disease: Secondary | ICD-10-CM | POA: Diagnosis not present

## 2023-08-04 DIAGNOSIS — Z992 Dependence on renal dialysis: Secondary | ICD-10-CM | POA: Diagnosis not present

## 2023-08-06 DIAGNOSIS — Z992 Dependence on renal dialysis: Secondary | ICD-10-CM | POA: Diagnosis not present

## 2023-08-06 DIAGNOSIS — N186 End stage renal disease: Secondary | ICD-10-CM | POA: Diagnosis not present

## 2023-08-06 DIAGNOSIS — N2581 Secondary hyperparathyroidism of renal origin: Secondary | ICD-10-CM | POA: Diagnosis not present

## 2023-08-07 DIAGNOSIS — Z992 Dependence on renal dialysis: Secondary | ICD-10-CM | POA: Diagnosis not present

## 2023-08-07 DIAGNOSIS — N041 Nephrotic syndrome with focal and segmental glomerular lesions: Secondary | ICD-10-CM | POA: Diagnosis not present

## 2023-08-07 DIAGNOSIS — N186 End stage renal disease: Secondary | ICD-10-CM | POA: Diagnosis not present

## 2023-08-08 DIAGNOSIS — Z992 Dependence on renal dialysis: Secondary | ICD-10-CM | POA: Diagnosis not present

## 2023-08-08 DIAGNOSIS — N2581 Secondary hyperparathyroidism of renal origin: Secondary | ICD-10-CM | POA: Diagnosis not present

## 2023-08-08 DIAGNOSIS — N186 End stage renal disease: Secondary | ICD-10-CM | POA: Diagnosis not present

## 2023-08-11 DIAGNOSIS — N186 End stage renal disease: Secondary | ICD-10-CM | POA: Diagnosis not present

## 2023-08-11 DIAGNOSIS — Z992 Dependence on renal dialysis: Secondary | ICD-10-CM | POA: Diagnosis not present

## 2023-08-11 DIAGNOSIS — N2581 Secondary hyperparathyroidism of renal origin: Secondary | ICD-10-CM | POA: Diagnosis not present

## 2023-08-12 DIAGNOSIS — N186 End stage renal disease: Secondary | ICD-10-CM | POA: Diagnosis not present

## 2023-08-12 DIAGNOSIS — N2581 Secondary hyperparathyroidism of renal origin: Secondary | ICD-10-CM | POA: Diagnosis not present

## 2023-08-12 DIAGNOSIS — Z992 Dependence on renal dialysis: Secondary | ICD-10-CM | POA: Diagnosis not present

## 2023-08-12 DIAGNOSIS — E8770 Fluid overload, unspecified: Secondary | ICD-10-CM | POA: Diagnosis not present

## 2023-08-13 DIAGNOSIS — Z992 Dependence on renal dialysis: Secondary | ICD-10-CM | POA: Diagnosis not present

## 2023-08-13 DIAGNOSIS — N186 End stage renal disease: Secondary | ICD-10-CM | POA: Diagnosis not present

## 2023-08-13 DIAGNOSIS — N2581 Secondary hyperparathyroidism of renal origin: Secondary | ICD-10-CM | POA: Diagnosis not present

## 2023-08-15 DIAGNOSIS — N2581 Secondary hyperparathyroidism of renal origin: Secondary | ICD-10-CM | POA: Diagnosis not present

## 2023-08-15 DIAGNOSIS — N186 End stage renal disease: Secondary | ICD-10-CM | POA: Diagnosis not present

## 2023-08-15 DIAGNOSIS — Z992 Dependence on renal dialysis: Secondary | ICD-10-CM | POA: Diagnosis not present

## 2023-08-18 DIAGNOSIS — N2581 Secondary hyperparathyroidism of renal origin: Secondary | ICD-10-CM | POA: Diagnosis not present

## 2023-08-18 DIAGNOSIS — Z992 Dependence on renal dialysis: Secondary | ICD-10-CM | POA: Diagnosis not present

## 2023-08-18 DIAGNOSIS — N186 End stage renal disease: Secondary | ICD-10-CM | POA: Diagnosis not present

## 2023-08-20 DIAGNOSIS — N2581 Secondary hyperparathyroidism of renal origin: Secondary | ICD-10-CM | POA: Diagnosis not present

## 2023-08-20 DIAGNOSIS — Z992 Dependence on renal dialysis: Secondary | ICD-10-CM | POA: Diagnosis not present

## 2023-08-20 DIAGNOSIS — N186 End stage renal disease: Secondary | ICD-10-CM | POA: Diagnosis not present

## 2023-08-22 DIAGNOSIS — N186 End stage renal disease: Secondary | ICD-10-CM | POA: Diagnosis not present

## 2023-08-22 DIAGNOSIS — Z992 Dependence on renal dialysis: Secondary | ICD-10-CM | POA: Diagnosis not present

## 2023-08-22 DIAGNOSIS — N2581 Secondary hyperparathyroidism of renal origin: Secondary | ICD-10-CM | POA: Diagnosis not present

## 2023-08-25 DIAGNOSIS — N2581 Secondary hyperparathyroidism of renal origin: Secondary | ICD-10-CM | POA: Diagnosis not present

## 2023-08-25 DIAGNOSIS — Z992 Dependence on renal dialysis: Secondary | ICD-10-CM | POA: Diagnosis not present

## 2023-08-25 DIAGNOSIS — N186 End stage renal disease: Secondary | ICD-10-CM | POA: Diagnosis not present

## 2023-08-27 DIAGNOSIS — N186 End stage renal disease: Secondary | ICD-10-CM | POA: Diagnosis not present

## 2023-08-27 DIAGNOSIS — Z992 Dependence on renal dialysis: Secondary | ICD-10-CM | POA: Diagnosis not present

## 2023-08-27 DIAGNOSIS — N2581 Secondary hyperparathyroidism of renal origin: Secondary | ICD-10-CM | POA: Diagnosis not present

## 2023-08-29 DIAGNOSIS — N186 End stage renal disease: Secondary | ICD-10-CM | POA: Diagnosis not present

## 2023-08-29 DIAGNOSIS — Z992 Dependence on renal dialysis: Secondary | ICD-10-CM | POA: Diagnosis not present

## 2023-08-29 DIAGNOSIS — N2581 Secondary hyperparathyroidism of renal origin: Secondary | ICD-10-CM | POA: Diagnosis not present

## 2023-09-01 DIAGNOSIS — N2581 Secondary hyperparathyroidism of renal origin: Secondary | ICD-10-CM | POA: Diagnosis not present

## 2023-09-01 DIAGNOSIS — N186 End stage renal disease: Secondary | ICD-10-CM | POA: Diagnosis not present

## 2023-09-01 DIAGNOSIS — Z992 Dependence on renal dialysis: Secondary | ICD-10-CM | POA: Diagnosis not present

## 2023-09-03 DIAGNOSIS — N2581 Secondary hyperparathyroidism of renal origin: Secondary | ICD-10-CM | POA: Diagnosis not present

## 2023-09-03 DIAGNOSIS — Z992 Dependence on renal dialysis: Secondary | ICD-10-CM | POA: Diagnosis not present

## 2023-09-03 DIAGNOSIS — N186 End stage renal disease: Secondary | ICD-10-CM | POA: Diagnosis not present

## 2023-09-05 DIAGNOSIS — N2581 Secondary hyperparathyroidism of renal origin: Secondary | ICD-10-CM | POA: Diagnosis not present

## 2023-09-05 DIAGNOSIS — Z992 Dependence on renal dialysis: Secondary | ICD-10-CM | POA: Diagnosis not present

## 2023-09-05 DIAGNOSIS — N186 End stage renal disease: Secondary | ICD-10-CM | POA: Diagnosis not present

## 2023-09-07 DIAGNOSIS — N186 End stage renal disease: Secondary | ICD-10-CM | POA: Diagnosis not present

## 2023-09-07 DIAGNOSIS — Z992 Dependence on renal dialysis: Secondary | ICD-10-CM | POA: Diagnosis not present

## 2023-09-07 DIAGNOSIS — N041 Nephrotic syndrome with focal and segmental glomerular lesions: Secondary | ICD-10-CM | POA: Diagnosis not present

## 2023-09-08 DIAGNOSIS — Z992 Dependence on renal dialysis: Secondary | ICD-10-CM | POA: Diagnosis not present

## 2023-09-08 DIAGNOSIS — N186 End stage renal disease: Secondary | ICD-10-CM | POA: Diagnosis not present

## 2023-09-08 DIAGNOSIS — N2581 Secondary hyperparathyroidism of renal origin: Secondary | ICD-10-CM | POA: Diagnosis not present

## 2023-09-10 DIAGNOSIS — Z992 Dependence on renal dialysis: Secondary | ICD-10-CM | POA: Diagnosis not present

## 2023-09-10 DIAGNOSIS — N2581 Secondary hyperparathyroidism of renal origin: Secondary | ICD-10-CM | POA: Diagnosis not present

## 2023-09-10 DIAGNOSIS — N186 End stage renal disease: Secondary | ICD-10-CM | POA: Diagnosis not present

## 2023-09-12 DIAGNOSIS — N2581 Secondary hyperparathyroidism of renal origin: Secondary | ICD-10-CM | POA: Diagnosis not present

## 2023-09-12 DIAGNOSIS — N186 End stage renal disease: Secondary | ICD-10-CM | POA: Diagnosis not present

## 2023-09-12 DIAGNOSIS — Z992 Dependence on renal dialysis: Secondary | ICD-10-CM | POA: Diagnosis not present

## 2023-09-15 DIAGNOSIS — N2581 Secondary hyperparathyroidism of renal origin: Secondary | ICD-10-CM | POA: Diagnosis not present

## 2023-09-15 DIAGNOSIS — N186 End stage renal disease: Secondary | ICD-10-CM | POA: Diagnosis not present

## 2023-09-15 DIAGNOSIS — Z992 Dependence on renal dialysis: Secondary | ICD-10-CM | POA: Diagnosis not present

## 2023-09-17 DIAGNOSIS — N186 End stage renal disease: Secondary | ICD-10-CM | POA: Diagnosis not present

## 2023-09-17 DIAGNOSIS — N2581 Secondary hyperparathyroidism of renal origin: Secondary | ICD-10-CM | POA: Diagnosis not present

## 2023-09-17 DIAGNOSIS — Z992 Dependence on renal dialysis: Secondary | ICD-10-CM | POA: Diagnosis not present

## 2023-09-19 DIAGNOSIS — N2581 Secondary hyperparathyroidism of renal origin: Secondary | ICD-10-CM | POA: Diagnosis not present

## 2023-09-19 DIAGNOSIS — Z992 Dependence on renal dialysis: Secondary | ICD-10-CM | POA: Diagnosis not present

## 2023-09-19 DIAGNOSIS — N186 End stage renal disease: Secondary | ICD-10-CM | POA: Diagnosis not present

## 2023-09-22 DIAGNOSIS — Z992 Dependence on renal dialysis: Secondary | ICD-10-CM | POA: Diagnosis not present

## 2023-09-22 DIAGNOSIS — N2581 Secondary hyperparathyroidism of renal origin: Secondary | ICD-10-CM | POA: Diagnosis not present

## 2023-09-22 DIAGNOSIS — N186 End stage renal disease: Secondary | ICD-10-CM | POA: Diagnosis not present

## 2023-09-24 DIAGNOSIS — N186 End stage renal disease: Secondary | ICD-10-CM | POA: Diagnosis not present

## 2023-09-24 DIAGNOSIS — N2581 Secondary hyperparathyroidism of renal origin: Secondary | ICD-10-CM | POA: Diagnosis not present

## 2023-09-24 DIAGNOSIS — Z992 Dependence on renal dialysis: Secondary | ICD-10-CM | POA: Diagnosis not present

## 2023-09-26 DIAGNOSIS — Z992 Dependence on renal dialysis: Secondary | ICD-10-CM | POA: Diagnosis not present

## 2023-09-26 DIAGNOSIS — N186 End stage renal disease: Secondary | ICD-10-CM | POA: Diagnosis not present

## 2023-09-26 DIAGNOSIS — N2581 Secondary hyperparathyroidism of renal origin: Secondary | ICD-10-CM | POA: Diagnosis not present

## 2023-09-28 DIAGNOSIS — D631 Anemia in chronic kidney disease: Secondary | ICD-10-CM | POA: Diagnosis not present

## 2023-09-28 DIAGNOSIS — I132 Hypertensive heart and chronic kidney disease with heart failure and with stage 5 chronic kidney disease, or end stage renal disease: Secondary | ICD-10-CM | POA: Diagnosis not present

## 2023-09-28 DIAGNOSIS — N186 End stage renal disease: Secondary | ICD-10-CM | POA: Diagnosis not present

## 2023-09-28 DIAGNOSIS — M86171 Other acute osteomyelitis, right ankle and foot: Secondary | ICD-10-CM | POA: Diagnosis not present

## 2023-09-28 DIAGNOSIS — G4733 Obstructive sleep apnea (adult) (pediatric): Secondary | ICD-10-CM | POA: Diagnosis not present

## 2023-09-28 DIAGNOSIS — I509 Heart failure, unspecified: Secondary | ICD-10-CM | POA: Diagnosis not present

## 2023-09-28 DIAGNOSIS — Z01818 Encounter for other preprocedural examination: Secondary | ICD-10-CM | POA: Diagnosis not present

## 2023-09-28 DIAGNOSIS — I480 Paroxysmal atrial fibrillation: Secondary | ICD-10-CM | POA: Diagnosis not present

## 2023-09-28 DIAGNOSIS — L03031 Cellulitis of right toe: Secondary | ICD-10-CM | POA: Diagnosis not present

## 2023-09-29 DIAGNOSIS — Z992 Dependence on renal dialysis: Secondary | ICD-10-CM | POA: Diagnosis not present

## 2023-09-29 DIAGNOSIS — N2581 Secondary hyperparathyroidism of renal origin: Secondary | ICD-10-CM | POA: Diagnosis not present

## 2023-09-29 DIAGNOSIS — N186 End stage renal disease: Secondary | ICD-10-CM | POA: Diagnosis not present

## 2023-10-01 DIAGNOSIS — N186 End stage renal disease: Secondary | ICD-10-CM | POA: Diagnosis not present

## 2023-10-01 DIAGNOSIS — N2581 Secondary hyperparathyroidism of renal origin: Secondary | ICD-10-CM | POA: Diagnosis not present

## 2023-10-01 DIAGNOSIS — Z992 Dependence on renal dialysis: Secondary | ICD-10-CM | POA: Diagnosis not present

## 2023-10-03 DIAGNOSIS — N2581 Secondary hyperparathyroidism of renal origin: Secondary | ICD-10-CM | POA: Diagnosis not present

## 2023-10-03 DIAGNOSIS — N186 End stage renal disease: Secondary | ICD-10-CM | POA: Diagnosis not present

## 2023-10-03 DIAGNOSIS — Z992 Dependence on renal dialysis: Secondary | ICD-10-CM | POA: Diagnosis not present

## 2023-10-06 DIAGNOSIS — Z992 Dependence on renal dialysis: Secondary | ICD-10-CM | POA: Diagnosis not present

## 2023-10-06 DIAGNOSIS — N186 End stage renal disease: Secondary | ICD-10-CM | POA: Diagnosis not present

## 2023-10-06 DIAGNOSIS — N2581 Secondary hyperparathyroidism of renal origin: Secondary | ICD-10-CM | POA: Diagnosis not present

## 2023-10-07 DIAGNOSIS — N186 End stage renal disease: Secondary | ICD-10-CM | POA: Diagnosis not present

## 2023-10-07 DIAGNOSIS — N041 Nephrotic syndrome with focal and segmental glomerular lesions: Secondary | ICD-10-CM | POA: Diagnosis not present

## 2023-10-07 DIAGNOSIS — Z992 Dependence on renal dialysis: Secondary | ICD-10-CM | POA: Diagnosis not present

## 2023-10-08 DIAGNOSIS — N2581 Secondary hyperparathyroidism of renal origin: Secondary | ICD-10-CM | POA: Diagnosis not present

## 2023-10-08 DIAGNOSIS — N186 End stage renal disease: Secondary | ICD-10-CM | POA: Diagnosis not present

## 2023-10-08 DIAGNOSIS — Z992 Dependence on renal dialysis: Secondary | ICD-10-CM | POA: Diagnosis not present

## 2023-10-10 DIAGNOSIS — N186 End stage renal disease: Secondary | ICD-10-CM | POA: Diagnosis not present

## 2023-10-10 DIAGNOSIS — N2581 Secondary hyperparathyroidism of renal origin: Secondary | ICD-10-CM | POA: Diagnosis not present

## 2023-10-10 DIAGNOSIS — Z992 Dependence on renal dialysis: Secondary | ICD-10-CM | POA: Diagnosis not present

## 2023-10-12 DIAGNOSIS — T82898D Other specified complication of vascular prosthetic devices, implants and grafts, subsequent encounter: Secondary | ICD-10-CM | POA: Diagnosis not present

## 2023-10-12 DIAGNOSIS — D72829 Elevated white blood cell count, unspecified: Secondary | ICD-10-CM | POA: Diagnosis not present

## 2023-10-12 DIAGNOSIS — I82522 Chronic embolism and thrombosis of left iliac vein: Secondary | ICD-10-CM | POA: Diagnosis not present

## 2023-10-12 DIAGNOSIS — I272 Pulmonary hypertension, unspecified: Secondary | ICD-10-CM | POA: Diagnosis not present

## 2023-10-12 DIAGNOSIS — I2724 Chronic thromboembolic pulmonary hypertension: Secondary | ICD-10-CM | POA: Diagnosis not present

## 2023-10-12 DIAGNOSIS — I2721 Secondary pulmonary arterial hypertension: Secondary | ICD-10-CM | POA: Diagnosis not present

## 2023-10-12 DIAGNOSIS — I10 Essential (primary) hypertension: Secondary | ICD-10-CM | POA: Diagnosis not present

## 2023-10-12 DIAGNOSIS — Z4659 Encounter for fitting and adjustment of other gastrointestinal appliance and device: Secondary | ICD-10-CM | POA: Diagnosis not present

## 2023-10-12 DIAGNOSIS — I071 Rheumatic tricuspid insufficiency: Secondary | ICD-10-CM | POA: Diagnosis not present

## 2023-10-12 DIAGNOSIS — R931 Abnormal findings on diagnostic imaging of heart and coronary circulation: Secondary | ICD-10-CM | POA: Diagnosis not present

## 2023-10-12 DIAGNOSIS — R578 Other shock: Secondary | ICD-10-CM | POA: Diagnosis not present

## 2023-10-12 DIAGNOSIS — R570 Cardiogenic shock: Secondary | ICD-10-CM | POA: Diagnosis not present

## 2023-10-12 DIAGNOSIS — Z0389 Encounter for observation for other suspected diseases and conditions ruled out: Secondary | ICD-10-CM | POA: Diagnosis not present

## 2023-10-12 DIAGNOSIS — G4733 Obstructive sleep apnea (adult) (pediatric): Secondary | ICD-10-CM | POA: Diagnosis not present

## 2023-10-12 DIAGNOSIS — I82412 Acute embolism and thrombosis of left femoral vein: Secondary | ICD-10-CM | POA: Diagnosis not present

## 2023-10-12 DIAGNOSIS — N189 Chronic kidney disease, unspecified: Secondary | ICD-10-CM | POA: Diagnosis not present

## 2023-10-12 DIAGNOSIS — J951 Acute pulmonary insufficiency following thoracic surgery: Secondary | ICD-10-CM | POA: Diagnosis not present

## 2023-10-12 DIAGNOSIS — G4700 Insomnia, unspecified: Secondary | ICD-10-CM | POA: Diagnosis not present

## 2023-10-12 DIAGNOSIS — I2699 Other pulmonary embolism without acute cor pulmonale: Secondary | ICD-10-CM | POA: Diagnosis not present

## 2023-10-12 DIAGNOSIS — E441 Mild protein-calorie malnutrition: Secondary | ICD-10-CM | POA: Diagnosis not present

## 2023-10-12 DIAGNOSIS — T8249XA Other complication of vascular dialysis catheter, initial encounter: Secondary | ICD-10-CM | POA: Diagnosis not present

## 2023-10-12 DIAGNOSIS — I50811 Acute right heart failure: Secondary | ICD-10-CM | POA: Diagnosis not present

## 2023-10-12 DIAGNOSIS — F32A Depression, unspecified: Secondary | ICD-10-CM | POA: Diagnosis not present

## 2023-10-12 DIAGNOSIS — I96 Gangrene, not elsewhere classified: Secondary | ICD-10-CM | POA: Diagnosis not present

## 2023-10-12 DIAGNOSIS — Z940 Kidney transplant status: Secondary | ICD-10-CM | POA: Diagnosis not present

## 2023-10-12 DIAGNOSIS — J9601 Acute respiratory failure with hypoxia: Secondary | ICD-10-CM | POA: Diagnosis not present

## 2023-10-12 DIAGNOSIS — Z6841 Body Mass Index (BMI) 40.0 and over, adult: Secondary | ICD-10-CM | POA: Diagnosis not present

## 2023-10-12 DIAGNOSIS — R140 Abdominal distension (gaseous): Secondary | ICD-10-CM | POA: Diagnosis not present

## 2023-10-12 DIAGNOSIS — I132 Hypertensive heart and chronic kidney disease with heart failure and with stage 5 chronic kidney disease, or end stage renal disease: Secondary | ICD-10-CM | POA: Diagnosis not present

## 2023-10-12 DIAGNOSIS — Z992 Dependence on renal dialysis: Secondary | ICD-10-CM | POA: Diagnosis not present

## 2023-10-12 DIAGNOSIS — I519 Heart disease, unspecified: Secondary | ICD-10-CM | POA: Diagnosis not present

## 2023-10-12 DIAGNOSIS — K5989 Other specified functional intestinal disorders: Secondary | ICD-10-CM | POA: Diagnosis not present

## 2023-10-12 DIAGNOSIS — J9811 Atelectasis: Secondary | ICD-10-CM | POA: Diagnosis not present

## 2023-10-12 DIAGNOSIS — I120 Hypertensive chronic kidney disease with stage 5 chronic kidney disease or end stage renal disease: Secondary | ICD-10-CM | POA: Diagnosis not present

## 2023-10-12 DIAGNOSIS — D696 Thrombocytopenia, unspecified: Secondary | ICD-10-CM | POA: Diagnosis not present

## 2023-10-12 DIAGNOSIS — G8918 Other acute postprocedural pain: Secondary | ICD-10-CM | POA: Diagnosis not present

## 2023-10-12 DIAGNOSIS — M86171 Other acute osteomyelitis, right ankle and foot: Secondary | ICD-10-CM | POA: Diagnosis not present

## 2023-10-12 DIAGNOSIS — N179 Acute kidney failure, unspecified: Secondary | ICD-10-CM | POA: Diagnosis not present

## 2023-10-12 DIAGNOSIS — T8619 Other complication of kidney transplant: Secondary | ICD-10-CM | POA: Diagnosis not present

## 2023-10-12 DIAGNOSIS — I2694 Multiple subsegmental pulmonary emboli without acute cor pulmonale: Secondary | ICD-10-CM | POA: Diagnosis not present

## 2023-10-12 DIAGNOSIS — J9602 Acute respiratory failure with hypercapnia: Secondary | ICD-10-CM | POA: Diagnosis not present

## 2023-10-12 DIAGNOSIS — Z9889 Other specified postprocedural states: Secondary | ICD-10-CM | POA: Diagnosis not present

## 2023-10-12 DIAGNOSIS — R1909 Other intra-abdominal and pelvic swelling, mass and lump: Secondary | ICD-10-CM | POA: Diagnosis not present

## 2023-10-12 DIAGNOSIS — K6389 Other specified diseases of intestine: Secondary | ICD-10-CM | POA: Diagnosis not present

## 2023-10-12 DIAGNOSIS — I314 Cardiac tamponade: Secondary | ICD-10-CM | POA: Diagnosis not present

## 2023-10-12 DIAGNOSIS — I358 Other nonrheumatic aortic valve disorders: Secondary | ICD-10-CM | POA: Diagnosis not present

## 2023-10-12 DIAGNOSIS — N186 End stage renal disease: Secondary | ICD-10-CM | POA: Diagnosis not present

## 2023-10-12 DIAGNOSIS — I2782 Chronic pulmonary embolism: Secondary | ICD-10-CM | POA: Diagnosis not present

## 2023-10-12 DIAGNOSIS — E8770 Fluid overload, unspecified: Secondary | ICD-10-CM | POA: Diagnosis not present

## 2023-10-12 DIAGNOSIS — I9789 Other postprocedural complications and disorders of the circulatory system, not elsewhere classified: Secondary | ICD-10-CM | POA: Diagnosis not present

## 2023-10-12 DIAGNOSIS — Z86711 Personal history of pulmonary embolism: Secondary | ICD-10-CM | POA: Diagnosis not present

## 2023-10-12 DIAGNOSIS — D62 Acute posthemorrhagic anemia: Secondary | ICD-10-CM | POA: Diagnosis not present

## 2023-10-12 DIAGNOSIS — J811 Chronic pulmonary edema: Secondary | ICD-10-CM | POA: Diagnosis not present

## 2023-10-12 DIAGNOSIS — I3139 Other pericardial effusion (noninflammatory): Secondary | ICD-10-CM | POA: Diagnosis not present

## 2023-10-12 DIAGNOSIS — E039 Hypothyroidism, unspecified: Secondary | ICD-10-CM | POA: Diagnosis not present

## 2023-10-12 DIAGNOSIS — Z452 Encounter for adjustment and management of vascular access device: Secondary | ICD-10-CM | POA: Diagnosis not present

## 2023-10-12 DIAGNOSIS — R918 Other nonspecific abnormal finding of lung field: Secondary | ICD-10-CM | POA: Diagnosis not present

## 2023-10-12 DIAGNOSIS — K567 Ileus, unspecified: Secondary | ICD-10-CM | POA: Diagnosis not present

## 2023-10-12 DIAGNOSIS — R17 Unspecified jaundice: Secondary | ICD-10-CM | POA: Diagnosis not present

## 2023-10-12 DIAGNOSIS — T82868A Thrombosis of vascular prosthetic devices, implants and grafts, initial encounter: Secondary | ICD-10-CM | POA: Diagnosis not present

## 2023-10-12 DIAGNOSIS — K5939 Other megacolon: Secondary | ICD-10-CM | POA: Diagnosis not present

## 2023-10-12 DIAGNOSIS — Z9281 Personal history of extracorporeal membrane oxygenation (ECMO): Secondary | ICD-10-CM | POA: Diagnosis not present

## 2023-10-12 DIAGNOSIS — L03031 Cellulitis of right toe: Secondary | ICD-10-CM | POA: Diagnosis not present

## 2023-10-12 DIAGNOSIS — I480 Paroxysmal atrial fibrillation: Secondary | ICD-10-CM | POA: Diagnosis not present

## 2023-10-12 DIAGNOSIS — I5030 Unspecified diastolic (congestive) heart failure: Secondary | ICD-10-CM | POA: Diagnosis not present

## 2023-10-12 DIAGNOSIS — E872 Acidosis, unspecified: Secondary | ICD-10-CM | POA: Diagnosis not present

## 2023-10-12 DIAGNOSIS — Z9089 Acquired absence of other organs: Secondary | ICD-10-CM | POA: Diagnosis not present

## 2023-10-12 DIAGNOSIS — I5023 Acute on chronic systolic (congestive) heart failure: Secondary | ICD-10-CM | POA: Diagnosis not present

## 2023-10-12 DIAGNOSIS — D631 Anemia in chronic kidney disease: Secondary | ICD-10-CM | POA: Diagnosis not present

## 2023-10-12 DIAGNOSIS — R933 Abnormal findings on diagnostic imaging of other parts of digestive tract: Secondary | ICD-10-CM | POA: Diagnosis not present

## 2023-10-12 DIAGNOSIS — I509 Heart failure, unspecified: Secondary | ICD-10-CM | POA: Diagnosis not present

## 2023-10-12 DIAGNOSIS — I50810 Right heart failure, unspecified: Secondary | ICD-10-CM | POA: Diagnosis not present

## 2023-10-12 DIAGNOSIS — I4891 Unspecified atrial fibrillation: Secondary | ICD-10-CM | POA: Diagnosis not present

## 2023-10-12 DIAGNOSIS — I9589 Other hypotension: Secondary | ICD-10-CM | POA: Diagnosis not present

## 2023-10-12 DIAGNOSIS — J95821 Acute postprocedural respiratory failure: Secondary | ICD-10-CM | POA: Diagnosis not present

## 2023-10-12 DIAGNOSIS — I82611 Acute embolism and thrombosis of superficial veins of right upper extremity: Secondary | ICD-10-CM | POA: Diagnosis not present

## 2023-10-12 DIAGNOSIS — G8929 Other chronic pain: Secondary | ICD-10-CM | POA: Diagnosis not present

## 2023-10-12 DIAGNOSIS — T8612 Kidney transplant failure: Secondary | ICD-10-CM | POA: Diagnosis not present

## 2023-10-12 DIAGNOSIS — Z789 Other specified health status: Secondary | ICD-10-CM | POA: Diagnosis not present

## 2023-10-12 DIAGNOSIS — K3189 Other diseases of stomach and duodenum: Secondary | ICD-10-CM | POA: Diagnosis not present

## 2023-10-12 DIAGNOSIS — M2012 Hallux valgus (acquired), left foot: Secondary | ICD-10-CM | POA: Diagnosis not present

## 2023-10-12 DIAGNOSIS — T8111XA Postprocedural  cardiogenic shock, initial encounter: Secondary | ICD-10-CM | POA: Diagnosis not present

## 2023-10-12 DIAGNOSIS — J90 Pleural effusion, not elsewhere classified: Secondary | ICD-10-CM | POA: Diagnosis not present

## 2023-10-12 DIAGNOSIS — I959 Hypotension, unspecified: Secondary | ICD-10-CM | POA: Diagnosis not present

## 2023-10-12 DIAGNOSIS — Z4682 Encounter for fitting and adjustment of non-vascular catheter: Secondary | ICD-10-CM | POA: Diagnosis not present

## 2023-10-12 DIAGNOSIS — E1122 Type 2 diabetes mellitus with diabetic chronic kidney disease: Secondary | ICD-10-CM | POA: Diagnosis not present

## 2023-10-12 DIAGNOSIS — I2609 Other pulmonary embolism with acute cor pulmonale: Secondary | ICD-10-CM | POA: Diagnosis not present

## 2023-10-13 DIAGNOSIS — I2699 Other pulmonary embolism without acute cor pulmonale: Secondary | ICD-10-CM | POA: Diagnosis not present

## 2023-10-13 DIAGNOSIS — Z4659 Encounter for fitting and adjustment of other gastrointestinal appliance and device: Secondary | ICD-10-CM | POA: Diagnosis not present

## 2023-10-13 DIAGNOSIS — Z9281 Personal history of extracorporeal membrane oxygenation (ECMO): Secondary | ICD-10-CM | POA: Diagnosis not present

## 2023-10-13 DIAGNOSIS — J9811 Atelectasis: Secondary | ICD-10-CM | POA: Diagnosis not present

## 2023-10-13 DIAGNOSIS — I2782 Chronic pulmonary embolism: Secondary | ICD-10-CM | POA: Diagnosis not present

## 2023-10-13 DIAGNOSIS — I509 Heart failure, unspecified: Secondary | ICD-10-CM | POA: Diagnosis not present

## 2023-10-13 DIAGNOSIS — D696 Thrombocytopenia, unspecified: Secondary | ICD-10-CM | POA: Diagnosis not present

## 2023-10-13 DIAGNOSIS — R570 Cardiogenic shock: Secondary | ICD-10-CM | POA: Diagnosis not present

## 2023-10-13 DIAGNOSIS — J9601 Acute respiratory failure with hypoxia: Secondary | ICD-10-CM | POA: Diagnosis not present

## 2023-10-13 DIAGNOSIS — N186 End stage renal disease: Secondary | ICD-10-CM | POA: Diagnosis not present

## 2023-10-13 DIAGNOSIS — D631 Anemia in chronic kidney disease: Secondary | ICD-10-CM | POA: Diagnosis not present

## 2023-10-13 DIAGNOSIS — I2609 Other pulmonary embolism with acute cor pulmonale: Secondary | ICD-10-CM | POA: Diagnosis not present

## 2023-10-13 DIAGNOSIS — I959 Hypotension, unspecified: Secondary | ICD-10-CM | POA: Diagnosis not present

## 2023-10-13 DIAGNOSIS — Z992 Dependence on renal dialysis: Secondary | ICD-10-CM | POA: Diagnosis not present

## 2023-10-13 DIAGNOSIS — Z4682 Encounter for fitting and adjustment of non-vascular catheter: Secondary | ICD-10-CM | POA: Diagnosis not present

## 2023-10-13 DIAGNOSIS — Z452 Encounter for adjustment and management of vascular access device: Secondary | ICD-10-CM | POA: Diagnosis not present

## 2023-10-13 DIAGNOSIS — I82522 Chronic embolism and thrombosis of left iliac vein: Secondary | ICD-10-CM | POA: Diagnosis not present

## 2023-10-13 DIAGNOSIS — J9602 Acute respiratory failure with hypercapnia: Secondary | ICD-10-CM | POA: Diagnosis not present

## 2023-10-14 DIAGNOSIS — J9811 Atelectasis: Secondary | ICD-10-CM | POA: Diagnosis not present

## 2023-10-14 DIAGNOSIS — T8619 Other complication of kidney transplant: Secondary | ICD-10-CM | POA: Diagnosis not present

## 2023-10-14 DIAGNOSIS — T8612 Kidney transplant failure: Secondary | ICD-10-CM | POA: Diagnosis not present

## 2023-10-14 DIAGNOSIS — E872 Acidosis, unspecified: Secondary | ICD-10-CM | POA: Diagnosis not present

## 2023-10-14 DIAGNOSIS — N186 End stage renal disease: Secondary | ICD-10-CM | POA: Diagnosis not present

## 2023-10-14 DIAGNOSIS — Z992 Dependence on renal dialysis: Secondary | ICD-10-CM | POA: Diagnosis not present

## 2023-10-14 DIAGNOSIS — T8111XA Postprocedural  cardiogenic shock, initial encounter: Secondary | ICD-10-CM | POA: Diagnosis not present

## 2023-10-14 DIAGNOSIS — I10 Essential (primary) hypertension: Secondary | ICD-10-CM | POA: Diagnosis not present

## 2023-10-14 DIAGNOSIS — Z9281 Personal history of extracorporeal membrane oxygenation (ECMO): Secondary | ICD-10-CM | POA: Diagnosis not present

## 2023-10-14 DIAGNOSIS — I2724 Chronic thromboembolic pulmonary hypertension: Secondary | ICD-10-CM | POA: Diagnosis not present

## 2023-10-14 DIAGNOSIS — I509 Heart failure, unspecified: Secondary | ICD-10-CM | POA: Diagnosis not present

## 2023-10-14 DIAGNOSIS — Z4659 Encounter for fitting and adjustment of other gastrointestinal appliance and device: Secondary | ICD-10-CM | POA: Diagnosis not present

## 2023-10-14 DIAGNOSIS — D631 Anemia in chronic kidney disease: Secondary | ICD-10-CM | POA: Diagnosis not present

## 2023-10-14 DIAGNOSIS — I9589 Other hypotension: Secondary | ICD-10-CM | POA: Diagnosis not present

## 2023-10-14 DIAGNOSIS — E1122 Type 2 diabetes mellitus with diabetic chronic kidney disease: Secondary | ICD-10-CM | POA: Diagnosis not present

## 2023-10-14 DIAGNOSIS — I50810 Right heart failure, unspecified: Secondary | ICD-10-CM | POA: Diagnosis not present

## 2023-10-14 DIAGNOSIS — I2609 Other pulmonary embolism with acute cor pulmonale: Secondary | ICD-10-CM | POA: Diagnosis not present

## 2023-10-14 DIAGNOSIS — J95821 Acute postprocedural respiratory failure: Secondary | ICD-10-CM | POA: Diagnosis not present

## 2023-10-14 DIAGNOSIS — J9601 Acute respiratory failure with hypoxia: Secondary | ICD-10-CM | POA: Diagnosis not present

## 2023-10-14 DIAGNOSIS — G8918 Other acute postprocedural pain: Secondary | ICD-10-CM | POA: Diagnosis not present

## 2023-10-15 DIAGNOSIS — T8111XA Postprocedural  cardiogenic shock, initial encounter: Secondary | ICD-10-CM | POA: Diagnosis not present

## 2023-10-15 DIAGNOSIS — J9811 Atelectasis: Secondary | ICD-10-CM | POA: Diagnosis not present

## 2023-10-15 DIAGNOSIS — Z4659 Encounter for fitting and adjustment of other gastrointestinal appliance and device: Secondary | ICD-10-CM | POA: Diagnosis not present

## 2023-10-15 DIAGNOSIS — I9589 Other hypotension: Secondary | ICD-10-CM | POA: Diagnosis not present

## 2023-10-15 DIAGNOSIS — J9601 Acute respiratory failure with hypoxia: Secondary | ICD-10-CM | POA: Diagnosis not present

## 2023-10-15 DIAGNOSIS — T8612 Kidney transplant failure: Secondary | ICD-10-CM | POA: Diagnosis not present

## 2023-10-15 DIAGNOSIS — I509 Heart failure, unspecified: Secondary | ICD-10-CM | POA: Diagnosis not present

## 2023-10-15 DIAGNOSIS — I2724 Chronic thromboembolic pulmonary hypertension: Secondary | ICD-10-CM | POA: Diagnosis not present

## 2023-10-15 DIAGNOSIS — I82412 Acute embolism and thrombosis of left femoral vein: Secondary | ICD-10-CM | POA: Insufficient documentation

## 2023-10-15 DIAGNOSIS — I2699 Other pulmonary embolism without acute cor pulmonale: Secondary | ICD-10-CM | POA: Diagnosis not present

## 2023-10-15 DIAGNOSIS — Z4682 Encounter for fitting and adjustment of non-vascular catheter: Secondary | ICD-10-CM | POA: Diagnosis not present

## 2023-10-15 DIAGNOSIS — G8918 Other acute postprocedural pain: Secondary | ICD-10-CM | POA: Diagnosis not present

## 2023-10-15 DIAGNOSIS — D631 Anemia in chronic kidney disease: Secondary | ICD-10-CM | POA: Diagnosis not present

## 2023-10-15 DIAGNOSIS — Z992 Dependence on renal dialysis: Secondary | ICD-10-CM | POA: Diagnosis not present

## 2023-10-15 DIAGNOSIS — T8619 Other complication of kidney transplant: Secondary | ICD-10-CM | POA: Diagnosis not present

## 2023-10-15 DIAGNOSIS — I10 Essential (primary) hypertension: Secondary | ICD-10-CM | POA: Diagnosis not present

## 2023-10-15 DIAGNOSIS — R918 Other nonspecific abnormal finding of lung field: Secondary | ICD-10-CM | POA: Diagnosis not present

## 2023-10-15 DIAGNOSIS — J95821 Acute postprocedural respiratory failure: Secondary | ICD-10-CM | POA: Diagnosis not present

## 2023-10-15 DIAGNOSIS — I50810 Right heart failure, unspecified: Secondary | ICD-10-CM | POA: Diagnosis not present

## 2023-10-15 DIAGNOSIS — E1122 Type 2 diabetes mellitus with diabetic chronic kidney disease: Secondary | ICD-10-CM | POA: Diagnosis not present

## 2023-10-15 DIAGNOSIS — N186 End stage renal disease: Secondary | ICD-10-CM | POA: Diagnosis not present

## 2023-10-15 DIAGNOSIS — I2609 Other pulmonary embolism with acute cor pulmonale: Secondary | ICD-10-CM | POA: Diagnosis not present

## 2023-10-15 DIAGNOSIS — E872 Acidosis, unspecified: Secondary | ICD-10-CM | POA: Diagnosis not present

## 2023-10-15 DIAGNOSIS — Z9281 Personal history of extracorporeal membrane oxygenation (ECMO): Secondary | ICD-10-CM | POA: Diagnosis not present

## 2023-10-16 DIAGNOSIS — I10 Essential (primary) hypertension: Secondary | ICD-10-CM | POA: Diagnosis not present

## 2023-10-16 DIAGNOSIS — E1122 Type 2 diabetes mellitus with diabetic chronic kidney disease: Secondary | ICD-10-CM | POA: Diagnosis not present

## 2023-10-16 DIAGNOSIS — D631 Anemia in chronic kidney disease: Secondary | ICD-10-CM | POA: Diagnosis not present

## 2023-10-16 DIAGNOSIS — I509 Heart failure, unspecified: Secondary | ICD-10-CM | POA: Diagnosis not present

## 2023-10-16 DIAGNOSIS — G8918 Other acute postprocedural pain: Secondary | ICD-10-CM | POA: Diagnosis not present

## 2023-10-16 DIAGNOSIS — I2699 Other pulmonary embolism without acute cor pulmonale: Secondary | ICD-10-CM | POA: Diagnosis not present

## 2023-10-16 DIAGNOSIS — J9601 Acute respiratory failure with hypoxia: Secondary | ICD-10-CM | POA: Diagnosis not present

## 2023-10-16 DIAGNOSIS — Z992 Dependence on renal dialysis: Secondary | ICD-10-CM | POA: Diagnosis not present

## 2023-10-16 DIAGNOSIS — I2724 Chronic thromboembolic pulmonary hypertension: Secondary | ICD-10-CM | POA: Diagnosis not present

## 2023-10-16 DIAGNOSIS — Z452 Encounter for adjustment and management of vascular access device: Secondary | ICD-10-CM | POA: Diagnosis not present

## 2023-10-16 DIAGNOSIS — T8619 Other complication of kidney transplant: Secondary | ICD-10-CM | POA: Diagnosis not present

## 2023-10-16 DIAGNOSIS — N186 End stage renal disease: Secondary | ICD-10-CM | POA: Diagnosis not present

## 2023-10-16 DIAGNOSIS — T8612 Kidney transplant failure: Secondary | ICD-10-CM | POA: Diagnosis not present

## 2023-10-16 DIAGNOSIS — T8111XA Postprocedural  cardiogenic shock, initial encounter: Secondary | ICD-10-CM | POA: Diagnosis not present

## 2023-10-16 DIAGNOSIS — Z9281 Personal history of extracorporeal membrane oxygenation (ECMO): Secondary | ICD-10-CM | POA: Diagnosis not present

## 2023-10-16 DIAGNOSIS — J95821 Acute postprocedural respiratory failure: Secondary | ICD-10-CM | POA: Diagnosis not present

## 2023-10-16 DIAGNOSIS — E872 Acidosis, unspecified: Secondary | ICD-10-CM | POA: Diagnosis not present

## 2023-10-16 DIAGNOSIS — R918 Other nonspecific abnormal finding of lung field: Secondary | ICD-10-CM | POA: Diagnosis not present

## 2023-10-16 DIAGNOSIS — I9589 Other hypotension: Secondary | ICD-10-CM | POA: Diagnosis not present

## 2023-10-16 DIAGNOSIS — I50810 Right heart failure, unspecified: Secondary | ICD-10-CM | POA: Diagnosis not present

## 2023-10-16 DIAGNOSIS — I2609 Other pulmonary embolism with acute cor pulmonale: Secondary | ICD-10-CM | POA: Diagnosis not present

## 2023-10-17 DIAGNOSIS — D631 Anemia in chronic kidney disease: Secondary | ICD-10-CM | POA: Diagnosis not present

## 2023-10-17 DIAGNOSIS — N186 End stage renal disease: Secondary | ICD-10-CM | POA: Diagnosis not present

## 2023-10-17 DIAGNOSIS — R918 Other nonspecific abnormal finding of lung field: Secondary | ICD-10-CM | POA: Diagnosis not present

## 2023-10-17 DIAGNOSIS — E872 Acidosis, unspecified: Secondary | ICD-10-CM | POA: Diagnosis not present

## 2023-10-17 DIAGNOSIS — E1122 Type 2 diabetes mellitus with diabetic chronic kidney disease: Secondary | ICD-10-CM | POA: Diagnosis not present

## 2023-10-17 DIAGNOSIS — J9601 Acute respiratory failure with hypoxia: Secondary | ICD-10-CM | POA: Diagnosis not present

## 2023-10-17 DIAGNOSIS — T8111XA Postprocedural  cardiogenic shock, initial encounter: Secondary | ICD-10-CM | POA: Diagnosis not present

## 2023-10-17 DIAGNOSIS — I2724 Chronic thromboembolic pulmonary hypertension: Secondary | ICD-10-CM | POA: Diagnosis not present

## 2023-10-17 DIAGNOSIS — I2609 Other pulmonary embolism with acute cor pulmonale: Secondary | ICD-10-CM | POA: Diagnosis not present

## 2023-10-17 DIAGNOSIS — G8918 Other acute postprocedural pain: Secondary | ICD-10-CM | POA: Diagnosis not present

## 2023-10-17 DIAGNOSIS — I10 Essential (primary) hypertension: Secondary | ICD-10-CM | POA: Diagnosis not present

## 2023-10-17 DIAGNOSIS — T8619 Other complication of kidney transplant: Secondary | ICD-10-CM | POA: Diagnosis not present

## 2023-10-17 DIAGNOSIS — I509 Heart failure, unspecified: Secondary | ICD-10-CM | POA: Diagnosis not present

## 2023-10-17 DIAGNOSIS — T8612 Kidney transplant failure: Secondary | ICD-10-CM | POA: Diagnosis not present

## 2023-10-17 DIAGNOSIS — I2699 Other pulmonary embolism without acute cor pulmonale: Secondary | ICD-10-CM | POA: Diagnosis not present

## 2023-10-17 DIAGNOSIS — I9589 Other hypotension: Secondary | ICD-10-CM | POA: Diagnosis not present

## 2023-10-17 DIAGNOSIS — T8249XA Other complication of vascular dialysis catheter, initial encounter: Secondary | ICD-10-CM | POA: Diagnosis not present

## 2023-10-17 DIAGNOSIS — I50810 Right heart failure, unspecified: Secondary | ICD-10-CM | POA: Diagnosis not present

## 2023-10-17 DIAGNOSIS — Z992 Dependence on renal dialysis: Secondary | ICD-10-CM | POA: Diagnosis not present

## 2023-10-17 DIAGNOSIS — J95821 Acute postprocedural respiratory failure: Secondary | ICD-10-CM | POA: Diagnosis not present

## 2023-10-18 DIAGNOSIS — R140 Abdominal distension (gaseous): Secondary | ICD-10-CM | POA: Diagnosis not present

## 2023-10-18 DIAGNOSIS — J9811 Atelectasis: Secondary | ICD-10-CM | POA: Diagnosis not present

## 2023-10-18 DIAGNOSIS — I2724 Chronic thromboembolic pulmonary hypertension: Secondary | ICD-10-CM | POA: Diagnosis not present

## 2023-10-18 DIAGNOSIS — J95821 Acute postprocedural respiratory failure: Secondary | ICD-10-CM | POA: Diagnosis not present

## 2023-10-18 DIAGNOSIS — D631 Anemia in chronic kidney disease: Secondary | ICD-10-CM | POA: Diagnosis not present

## 2023-10-18 DIAGNOSIS — I9589 Other hypotension: Secondary | ICD-10-CM | POA: Diagnosis not present

## 2023-10-18 DIAGNOSIS — I2699 Other pulmonary embolism without acute cor pulmonale: Secondary | ICD-10-CM | POA: Diagnosis not present

## 2023-10-18 DIAGNOSIS — T8111XA Postprocedural  cardiogenic shock, initial encounter: Secondary | ICD-10-CM | POA: Diagnosis not present

## 2023-10-18 DIAGNOSIS — E1122 Type 2 diabetes mellitus with diabetic chronic kidney disease: Secondary | ICD-10-CM | POA: Diagnosis not present

## 2023-10-18 DIAGNOSIS — N186 End stage renal disease: Secondary | ICD-10-CM | POA: Diagnosis not present

## 2023-10-18 DIAGNOSIS — Z4659 Encounter for fitting and adjustment of other gastrointestinal appliance and device: Secondary | ICD-10-CM | POA: Diagnosis not present

## 2023-10-18 DIAGNOSIS — G8918 Other acute postprocedural pain: Secondary | ICD-10-CM | POA: Diagnosis not present

## 2023-10-18 DIAGNOSIS — I50810 Right heart failure, unspecified: Secondary | ICD-10-CM | POA: Diagnosis not present

## 2023-10-18 DIAGNOSIS — I509 Heart failure, unspecified: Secondary | ICD-10-CM | POA: Diagnosis not present

## 2023-10-18 DIAGNOSIS — Z992 Dependence on renal dialysis: Secondary | ICD-10-CM | POA: Diagnosis not present

## 2023-10-18 DIAGNOSIS — D72829 Elevated white blood cell count, unspecified: Secondary | ICD-10-CM | POA: Diagnosis not present

## 2023-10-19 DIAGNOSIS — D631 Anemia in chronic kidney disease: Secondary | ICD-10-CM | POA: Diagnosis not present

## 2023-10-19 DIAGNOSIS — I5023 Acute on chronic systolic (congestive) heart failure: Secondary | ICD-10-CM | POA: Diagnosis not present

## 2023-10-19 DIAGNOSIS — J9811 Atelectasis: Secondary | ICD-10-CM | POA: Diagnosis not present

## 2023-10-19 DIAGNOSIS — I2699 Other pulmonary embolism without acute cor pulmonale: Secondary | ICD-10-CM | POA: Diagnosis not present

## 2023-10-19 DIAGNOSIS — N186 End stage renal disease: Secondary | ICD-10-CM | POA: Diagnosis not present

## 2023-10-19 DIAGNOSIS — T82898D Other specified complication of vascular prosthetic devices, implants and grafts, subsequent encounter: Secondary | ICD-10-CM | POA: Diagnosis not present

## 2023-10-19 DIAGNOSIS — I132 Hypertensive heart and chronic kidney disease with heart failure and with stage 5 chronic kidney disease, or end stage renal disease: Secondary | ICD-10-CM | POA: Diagnosis not present

## 2023-10-19 DIAGNOSIS — R570 Cardiogenic shock: Secondary | ICD-10-CM | POA: Diagnosis not present

## 2023-10-19 DIAGNOSIS — I82412 Acute embolism and thrombosis of left femoral vein: Secondary | ICD-10-CM | POA: Diagnosis not present

## 2023-10-19 DIAGNOSIS — Z452 Encounter for adjustment and management of vascular access device: Secondary | ICD-10-CM | POA: Diagnosis not present

## 2023-10-19 DIAGNOSIS — J9601 Acute respiratory failure with hypoxia: Secondary | ICD-10-CM | POA: Diagnosis not present

## 2023-10-19 DIAGNOSIS — Z992 Dependence on renal dialysis: Secondary | ICD-10-CM | POA: Diagnosis not present

## 2023-10-19 DIAGNOSIS — I509 Heart failure, unspecified: Secondary | ICD-10-CM | POA: Diagnosis not present

## 2023-10-19 DIAGNOSIS — J95821 Acute postprocedural respiratory failure: Secondary | ICD-10-CM | POA: Diagnosis not present

## 2023-10-20 DIAGNOSIS — Z452 Encounter for adjustment and management of vascular access device: Secondary | ICD-10-CM | POA: Diagnosis not present

## 2023-10-20 DIAGNOSIS — I509 Heart failure, unspecified: Secondary | ICD-10-CM | POA: Diagnosis not present

## 2023-10-20 DIAGNOSIS — I2724 Chronic thromboembolic pulmonary hypertension: Secondary | ICD-10-CM | POA: Insufficient documentation

## 2023-10-20 DIAGNOSIS — Z4682 Encounter for fitting and adjustment of non-vascular catheter: Secondary | ICD-10-CM | POA: Diagnosis not present

## 2023-10-20 DIAGNOSIS — I2782 Chronic pulmonary embolism: Secondary | ICD-10-CM | POA: Diagnosis not present

## 2023-10-20 DIAGNOSIS — I2699 Other pulmonary embolism without acute cor pulmonale: Secondary | ICD-10-CM | POA: Diagnosis not present

## 2023-10-20 DIAGNOSIS — J9811 Atelectasis: Secondary | ICD-10-CM | POA: Diagnosis not present

## 2023-10-21 DIAGNOSIS — N186 End stage renal disease: Secondary | ICD-10-CM | POA: Diagnosis not present

## 2023-10-21 DIAGNOSIS — J9811 Atelectasis: Secondary | ICD-10-CM | POA: Diagnosis not present

## 2023-10-21 DIAGNOSIS — Z9281 Personal history of extracorporeal membrane oxygenation (ECMO): Secondary | ICD-10-CM | POA: Diagnosis not present

## 2023-10-21 DIAGNOSIS — Z452 Encounter for adjustment and management of vascular access device: Secondary | ICD-10-CM | POA: Diagnosis not present

## 2023-10-21 DIAGNOSIS — Z992 Dependence on renal dialysis: Secondary | ICD-10-CM | POA: Diagnosis not present

## 2023-10-21 DIAGNOSIS — K3189 Other diseases of stomach and duodenum: Secondary | ICD-10-CM | POA: Diagnosis not present

## 2023-10-21 DIAGNOSIS — I50811 Acute right heart failure: Secondary | ICD-10-CM | POA: Diagnosis not present

## 2023-10-21 DIAGNOSIS — Z4682 Encounter for fitting and adjustment of non-vascular catheter: Secondary | ICD-10-CM | POA: Diagnosis not present

## 2023-10-21 DIAGNOSIS — J90 Pleural effusion, not elsewhere classified: Secondary | ICD-10-CM | POA: Diagnosis not present

## 2023-10-21 DIAGNOSIS — I132 Hypertensive heart and chronic kidney disease with heart failure and with stage 5 chronic kidney disease, or end stage renal disease: Secondary | ICD-10-CM | POA: Diagnosis not present

## 2023-10-21 DIAGNOSIS — Z4659 Encounter for fitting and adjustment of other gastrointestinal appliance and device: Secondary | ICD-10-CM | POA: Diagnosis not present

## 2023-10-21 DIAGNOSIS — I2699 Other pulmonary embolism without acute cor pulmonale: Secondary | ICD-10-CM | POA: Diagnosis not present

## 2023-10-21 DIAGNOSIS — I509 Heart failure, unspecified: Secondary | ICD-10-CM | POA: Diagnosis not present

## 2023-10-21 DIAGNOSIS — J9601 Acute respiratory failure with hypoxia: Secondary | ICD-10-CM | POA: Diagnosis not present

## 2023-10-22 DIAGNOSIS — Z992 Dependence on renal dialysis: Secondary | ICD-10-CM | POA: Diagnosis not present

## 2023-10-22 DIAGNOSIS — R1909 Other intra-abdominal and pelvic swelling, mass and lump: Secondary | ICD-10-CM | POA: Diagnosis not present

## 2023-10-22 DIAGNOSIS — J95821 Acute postprocedural respiratory failure: Secondary | ICD-10-CM | POA: Diagnosis not present

## 2023-10-22 DIAGNOSIS — R570 Cardiogenic shock: Secondary | ICD-10-CM | POA: Diagnosis not present

## 2023-10-22 DIAGNOSIS — I509 Heart failure, unspecified: Secondary | ICD-10-CM | POA: Diagnosis not present

## 2023-10-22 DIAGNOSIS — I2699 Other pulmonary embolism without acute cor pulmonale: Secondary | ICD-10-CM | POA: Diagnosis not present

## 2023-10-22 DIAGNOSIS — N186 End stage renal disease: Secondary | ICD-10-CM | POA: Diagnosis not present

## 2023-10-22 DIAGNOSIS — D631 Anemia in chronic kidney disease: Secondary | ICD-10-CM | POA: Diagnosis not present

## 2023-10-22 DIAGNOSIS — I132 Hypertensive heart and chronic kidney disease with heart failure and with stage 5 chronic kidney disease, or end stage renal disease: Secondary | ICD-10-CM | POA: Diagnosis not present

## 2023-10-22 DIAGNOSIS — I2609 Other pulmonary embolism with acute cor pulmonale: Secondary | ICD-10-CM | POA: Diagnosis not present

## 2023-10-22 DIAGNOSIS — Z9289 Personal history of other medical treatment: Secondary | ICD-10-CM | POA: Insufficient documentation

## 2023-10-22 DIAGNOSIS — Z4659 Encounter for fitting and adjustment of other gastrointestinal appliance and device: Secondary | ICD-10-CM | POA: Diagnosis not present

## 2023-10-22 DIAGNOSIS — I5023 Acute on chronic systolic (congestive) heart failure: Secondary | ICD-10-CM | POA: Diagnosis not present

## 2023-10-22 DIAGNOSIS — Z4682 Encounter for fitting and adjustment of non-vascular catheter: Secondary | ICD-10-CM | POA: Diagnosis not present

## 2023-10-22 DIAGNOSIS — J9601 Acute respiratory failure with hypoxia: Secondary | ICD-10-CM | POA: Diagnosis not present

## 2023-10-22 DIAGNOSIS — Z9281 Personal history of extracorporeal membrane oxygenation (ECMO): Secondary | ICD-10-CM | POA: Diagnosis not present

## 2023-10-23 DIAGNOSIS — J951 Acute pulmonary insufficiency following thoracic surgery: Secondary | ICD-10-CM | POA: Diagnosis not present

## 2023-10-23 DIAGNOSIS — Z4682 Encounter for fitting and adjustment of non-vascular catheter: Secondary | ICD-10-CM | POA: Diagnosis not present

## 2023-10-23 DIAGNOSIS — I2724 Chronic thromboembolic pulmonary hypertension: Secondary | ICD-10-CM | POA: Diagnosis not present

## 2023-10-23 DIAGNOSIS — J9811 Atelectasis: Secondary | ICD-10-CM | POA: Diagnosis not present

## 2023-10-23 DIAGNOSIS — N186 End stage renal disease: Secondary | ICD-10-CM | POA: Diagnosis not present

## 2023-10-23 DIAGNOSIS — Z992 Dependence on renal dialysis: Secondary | ICD-10-CM | POA: Diagnosis not present

## 2023-10-23 DIAGNOSIS — I2699 Other pulmonary embolism without acute cor pulmonale: Secondary | ICD-10-CM | POA: Diagnosis not present

## 2023-10-23 DIAGNOSIS — Z452 Encounter for adjustment and management of vascular access device: Secondary | ICD-10-CM | POA: Diagnosis not present

## 2023-10-23 DIAGNOSIS — Z4659 Encounter for fitting and adjustment of other gastrointestinal appliance and device: Secondary | ICD-10-CM | POA: Diagnosis not present

## 2023-10-23 DIAGNOSIS — I120 Hypertensive chronic kidney disease with stage 5 chronic kidney disease or end stage renal disease: Secondary | ICD-10-CM | POA: Diagnosis not present

## 2023-10-23 DIAGNOSIS — J811 Chronic pulmonary edema: Secondary | ICD-10-CM | POA: Diagnosis not present

## 2023-10-23 DIAGNOSIS — R570 Cardiogenic shock: Secondary | ICD-10-CM | POA: Diagnosis not present

## 2023-10-23 DIAGNOSIS — I509 Heart failure, unspecified: Secondary | ICD-10-CM | POA: Diagnosis not present

## 2023-10-23 DIAGNOSIS — I2609 Other pulmonary embolism with acute cor pulmonale: Secondary | ICD-10-CM | POA: Diagnosis not present

## 2023-10-24 DIAGNOSIS — N186 End stage renal disease: Secondary | ICD-10-CM | POA: Diagnosis not present

## 2023-10-24 DIAGNOSIS — Z4682 Encounter for fitting and adjustment of non-vascular catheter: Secondary | ICD-10-CM | POA: Diagnosis not present

## 2023-10-24 DIAGNOSIS — I2609 Other pulmonary embolism with acute cor pulmonale: Secondary | ICD-10-CM | POA: Diagnosis not present

## 2023-10-24 DIAGNOSIS — J9602 Acute respiratory failure with hypercapnia: Secondary | ICD-10-CM | POA: Diagnosis not present

## 2023-10-24 DIAGNOSIS — J9601 Acute respiratory failure with hypoxia: Secondary | ICD-10-CM | POA: Diagnosis not present

## 2023-10-24 DIAGNOSIS — M2012 Hallux valgus (acquired), left foot: Secondary | ICD-10-CM | POA: Diagnosis not present

## 2023-10-24 DIAGNOSIS — I50810 Right heart failure, unspecified: Secondary | ICD-10-CM | POA: Diagnosis not present

## 2023-10-24 DIAGNOSIS — D631 Anemia in chronic kidney disease: Secondary | ICD-10-CM | POA: Diagnosis not present

## 2023-10-24 DIAGNOSIS — Z992 Dependence on renal dialysis: Secondary | ICD-10-CM | POA: Diagnosis not present

## 2023-10-24 DIAGNOSIS — Z452 Encounter for adjustment and management of vascular access device: Secondary | ICD-10-CM | POA: Diagnosis not present

## 2023-10-25 DIAGNOSIS — J9811 Atelectasis: Secondary | ICD-10-CM | POA: Diagnosis not present

## 2023-10-25 DIAGNOSIS — R918 Other nonspecific abnormal finding of lung field: Secondary | ICD-10-CM | POA: Diagnosis not present

## 2023-10-25 DIAGNOSIS — Z9089 Acquired absence of other organs: Secondary | ICD-10-CM | POA: Diagnosis not present

## 2023-10-25 DIAGNOSIS — R570 Cardiogenic shock: Secondary | ICD-10-CM | POA: Diagnosis not present

## 2023-10-25 DIAGNOSIS — J90 Pleural effusion, not elsewhere classified: Secondary | ICD-10-CM | POA: Diagnosis not present

## 2023-10-25 DIAGNOSIS — N186 End stage renal disease: Secondary | ICD-10-CM | POA: Diagnosis not present

## 2023-10-25 DIAGNOSIS — Z992 Dependence on renal dialysis: Secondary | ICD-10-CM | POA: Diagnosis not present

## 2023-10-25 DIAGNOSIS — I120 Hypertensive chronic kidney disease with stage 5 chronic kidney disease or end stage renal disease: Secondary | ICD-10-CM | POA: Diagnosis not present

## 2023-10-25 DIAGNOSIS — J951 Acute pulmonary insufficiency following thoracic surgery: Secondary | ICD-10-CM | POA: Diagnosis not present

## 2023-10-25 DIAGNOSIS — I2724 Chronic thromboembolic pulmonary hypertension: Secondary | ICD-10-CM | POA: Diagnosis not present

## 2023-10-25 DIAGNOSIS — Z452 Encounter for adjustment and management of vascular access device: Secondary | ICD-10-CM | POA: Diagnosis not present

## 2023-10-26 DIAGNOSIS — Z9281 Personal history of extracorporeal membrane oxygenation (ECMO): Secondary | ICD-10-CM | POA: Diagnosis not present

## 2023-10-26 DIAGNOSIS — I2699 Other pulmonary embolism without acute cor pulmonale: Secondary | ICD-10-CM | POA: Diagnosis not present

## 2023-10-26 DIAGNOSIS — J9811 Atelectasis: Secondary | ICD-10-CM | POA: Diagnosis not present

## 2023-10-26 DIAGNOSIS — D72829 Elevated white blood cell count, unspecified: Secondary | ICD-10-CM | POA: Diagnosis not present

## 2023-10-26 DIAGNOSIS — Z4682 Encounter for fitting and adjustment of non-vascular catheter: Secondary | ICD-10-CM | POA: Diagnosis not present

## 2023-10-26 DIAGNOSIS — R918 Other nonspecific abnormal finding of lung field: Secondary | ICD-10-CM | POA: Diagnosis not present

## 2023-10-26 DIAGNOSIS — Z86711 Personal history of pulmonary embolism: Secondary | ICD-10-CM | POA: Diagnosis not present

## 2023-10-26 DIAGNOSIS — K6389 Other specified diseases of intestine: Secondary | ICD-10-CM | POA: Diagnosis not present

## 2023-10-26 DIAGNOSIS — N186 End stage renal disease: Secondary | ICD-10-CM | POA: Diagnosis not present

## 2023-10-26 DIAGNOSIS — T8612 Kidney transplant failure: Secondary | ICD-10-CM | POA: Diagnosis not present

## 2023-10-26 DIAGNOSIS — I2724 Chronic thromboembolic pulmonary hypertension: Secondary | ICD-10-CM | POA: Diagnosis not present

## 2023-10-26 DIAGNOSIS — I509 Heart failure, unspecified: Secondary | ICD-10-CM | POA: Diagnosis not present

## 2023-10-26 DIAGNOSIS — Z452 Encounter for adjustment and management of vascular access device: Secondary | ICD-10-CM | POA: Diagnosis not present

## 2023-10-27 DIAGNOSIS — J9811 Atelectasis: Secondary | ICD-10-CM | POA: Diagnosis not present

## 2023-10-27 DIAGNOSIS — I2699 Other pulmonary embolism without acute cor pulmonale: Secondary | ICD-10-CM | POA: Diagnosis not present

## 2023-10-27 DIAGNOSIS — R931 Abnormal findings on diagnostic imaging of heart and coronary circulation: Secondary | ICD-10-CM | POA: Diagnosis not present

## 2023-10-27 DIAGNOSIS — Z452 Encounter for adjustment and management of vascular access device: Secondary | ICD-10-CM | POA: Diagnosis not present

## 2023-10-27 DIAGNOSIS — R17 Unspecified jaundice: Secondary | ICD-10-CM | POA: Diagnosis not present

## 2023-10-27 DIAGNOSIS — I509 Heart failure, unspecified: Secondary | ICD-10-CM | POA: Diagnosis not present

## 2023-10-27 DIAGNOSIS — D72829 Elevated white blood cell count, unspecified: Secondary | ICD-10-CM | POA: Diagnosis not present

## 2023-10-27 DIAGNOSIS — I2724 Chronic thromboembolic pulmonary hypertension: Secondary | ICD-10-CM | POA: Diagnosis not present

## 2023-10-27 DIAGNOSIS — Z86711 Personal history of pulmonary embolism: Secondary | ICD-10-CM | POA: Diagnosis not present

## 2023-10-27 DIAGNOSIS — N186 End stage renal disease: Secondary | ICD-10-CM | POA: Diagnosis not present

## 2023-10-27 DIAGNOSIS — T8612 Kidney transplant failure: Secondary | ICD-10-CM | POA: Diagnosis not present

## 2023-10-27 DIAGNOSIS — R918 Other nonspecific abnormal finding of lung field: Secondary | ICD-10-CM | POA: Diagnosis not present

## 2023-10-28 DIAGNOSIS — D72829 Elevated white blood cell count, unspecified: Secondary | ICD-10-CM | POA: Diagnosis not present

## 2023-10-28 DIAGNOSIS — J9811 Atelectasis: Secondary | ICD-10-CM | POA: Diagnosis not present

## 2023-10-28 DIAGNOSIS — Z452 Encounter for adjustment and management of vascular access device: Secondary | ICD-10-CM | POA: Diagnosis not present

## 2023-10-28 DIAGNOSIS — T8612 Kidney transplant failure: Secondary | ICD-10-CM | POA: Diagnosis not present

## 2023-10-28 DIAGNOSIS — I2724 Chronic thromboembolic pulmonary hypertension: Secondary | ICD-10-CM | POA: Diagnosis not present

## 2023-10-28 DIAGNOSIS — R918 Other nonspecific abnormal finding of lung field: Secondary | ICD-10-CM | POA: Diagnosis not present

## 2023-10-28 DIAGNOSIS — I509 Heart failure, unspecified: Secondary | ICD-10-CM | POA: Diagnosis not present

## 2023-10-28 DIAGNOSIS — N186 End stage renal disease: Secondary | ICD-10-CM | POA: Diagnosis not present

## 2023-10-28 DIAGNOSIS — Z86711 Personal history of pulmonary embolism: Secondary | ICD-10-CM | POA: Diagnosis not present

## 2023-10-29 DIAGNOSIS — Z86711 Personal history of pulmonary embolism: Secondary | ICD-10-CM | POA: Diagnosis not present

## 2023-10-29 DIAGNOSIS — D72829 Elevated white blood cell count, unspecified: Secondary | ICD-10-CM | POA: Diagnosis not present

## 2023-10-29 DIAGNOSIS — I509 Heart failure, unspecified: Secondary | ICD-10-CM | POA: Diagnosis not present

## 2023-10-29 DIAGNOSIS — J90 Pleural effusion, not elsewhere classified: Secondary | ICD-10-CM | POA: Diagnosis not present

## 2023-10-29 DIAGNOSIS — Z452 Encounter for adjustment and management of vascular access device: Secondary | ICD-10-CM | POA: Diagnosis not present

## 2023-10-29 DIAGNOSIS — I2724 Chronic thromboembolic pulmonary hypertension: Secondary | ICD-10-CM | POA: Diagnosis not present

## 2023-10-29 DIAGNOSIS — I2699 Other pulmonary embolism without acute cor pulmonale: Secondary | ICD-10-CM | POA: Diagnosis not present

## 2023-10-29 DIAGNOSIS — J9811 Atelectasis: Secondary | ICD-10-CM | POA: Diagnosis not present

## 2023-10-29 DIAGNOSIS — T8612 Kidney transplant failure: Secondary | ICD-10-CM | POA: Diagnosis not present

## 2023-10-29 DIAGNOSIS — R918 Other nonspecific abnormal finding of lung field: Secondary | ICD-10-CM | POA: Diagnosis not present

## 2023-10-29 DIAGNOSIS — N186 End stage renal disease: Secondary | ICD-10-CM | POA: Diagnosis not present

## 2023-10-30 DIAGNOSIS — Z86711 Personal history of pulmonary embolism: Secondary | ICD-10-CM | POA: Diagnosis not present

## 2023-10-30 DIAGNOSIS — D72829 Elevated white blood cell count, unspecified: Secondary | ICD-10-CM | POA: Diagnosis not present

## 2023-10-30 DIAGNOSIS — I509 Heart failure, unspecified: Secondary | ICD-10-CM | POA: Diagnosis not present

## 2023-10-30 DIAGNOSIS — N186 End stage renal disease: Secondary | ICD-10-CM | POA: Diagnosis not present

## 2023-10-30 DIAGNOSIS — I2724 Chronic thromboembolic pulmonary hypertension: Secondary | ICD-10-CM | POA: Diagnosis not present

## 2023-10-30 DIAGNOSIS — Z452 Encounter for adjustment and management of vascular access device: Secondary | ICD-10-CM | POA: Diagnosis not present

## 2023-10-30 DIAGNOSIS — Z4682 Encounter for fitting and adjustment of non-vascular catheter: Secondary | ICD-10-CM | POA: Diagnosis not present

## 2023-10-30 DIAGNOSIS — J811 Chronic pulmonary edema: Secondary | ICD-10-CM | POA: Diagnosis not present

## 2023-10-30 DIAGNOSIS — R918 Other nonspecific abnormal finding of lung field: Secondary | ICD-10-CM | POA: Diagnosis not present

## 2023-10-30 DIAGNOSIS — I2699 Other pulmonary embolism without acute cor pulmonale: Secondary | ICD-10-CM | POA: Diagnosis not present

## 2023-10-30 DIAGNOSIS — T8612 Kidney transplant failure: Secondary | ICD-10-CM | POA: Diagnosis not present

## 2023-10-30 DIAGNOSIS — J9811 Atelectasis: Secondary | ICD-10-CM | POA: Diagnosis not present

## 2023-10-31 DIAGNOSIS — R931 Abnormal findings on diagnostic imaging of heart and coronary circulation: Secondary | ICD-10-CM | POA: Diagnosis not present

## 2023-10-31 DIAGNOSIS — Z4682 Encounter for fitting and adjustment of non-vascular catheter: Secondary | ICD-10-CM | POA: Diagnosis not present

## 2023-10-31 DIAGNOSIS — T8612 Kidney transplant failure: Secondary | ICD-10-CM | POA: Diagnosis not present

## 2023-10-31 DIAGNOSIS — J811 Chronic pulmonary edema: Secondary | ICD-10-CM | POA: Diagnosis not present

## 2023-10-31 DIAGNOSIS — N186 End stage renal disease: Secondary | ICD-10-CM | POA: Diagnosis not present

## 2023-10-31 DIAGNOSIS — I2699 Other pulmonary embolism without acute cor pulmonale: Secondary | ICD-10-CM | POA: Diagnosis not present

## 2023-10-31 DIAGNOSIS — I509 Heart failure, unspecified: Secondary | ICD-10-CM | POA: Diagnosis not present

## 2023-10-31 DIAGNOSIS — D631 Anemia in chronic kidney disease: Secondary | ICD-10-CM | POA: Diagnosis not present

## 2023-10-31 DIAGNOSIS — Z452 Encounter for adjustment and management of vascular access device: Secondary | ICD-10-CM | POA: Diagnosis not present

## 2023-10-31 DIAGNOSIS — I959 Hypotension, unspecified: Secondary | ICD-10-CM | POA: Diagnosis not present

## 2023-10-31 DIAGNOSIS — I2609 Other pulmonary embolism with acute cor pulmonale: Secondary | ICD-10-CM | POA: Diagnosis not present

## 2023-10-31 DIAGNOSIS — E441 Mild protein-calorie malnutrition: Secondary | ICD-10-CM | POA: Diagnosis not present

## 2023-10-31 DIAGNOSIS — I480 Paroxysmal atrial fibrillation: Secondary | ICD-10-CM | POA: Diagnosis not present

## 2023-10-31 DIAGNOSIS — D72829 Elevated white blood cell count, unspecified: Secondary | ICD-10-CM | POA: Diagnosis not present

## 2023-10-31 DIAGNOSIS — Z992 Dependence on renal dialysis: Secondary | ICD-10-CM | POA: Diagnosis not present

## 2023-10-31 DIAGNOSIS — J9601 Acute respiratory failure with hypoxia: Secondary | ICD-10-CM | POA: Diagnosis not present

## 2023-11-01 DIAGNOSIS — Z4659 Encounter for fitting and adjustment of other gastrointestinal appliance and device: Secondary | ICD-10-CM | POA: Diagnosis not present

## 2023-11-01 DIAGNOSIS — J9811 Atelectasis: Secondary | ICD-10-CM | POA: Diagnosis not present

## 2023-11-01 DIAGNOSIS — R931 Abnormal findings on diagnostic imaging of heart and coronary circulation: Secondary | ICD-10-CM | POA: Diagnosis not present

## 2023-11-01 DIAGNOSIS — Z452 Encounter for adjustment and management of vascular access device: Secondary | ICD-10-CM | POA: Diagnosis not present

## 2023-11-01 DIAGNOSIS — I509 Heart failure, unspecified: Secondary | ICD-10-CM | POA: Diagnosis not present

## 2023-11-01 DIAGNOSIS — Z9889 Other specified postprocedural states: Secondary | ICD-10-CM | POA: Insufficient documentation

## 2023-11-01 DIAGNOSIS — I2699 Other pulmonary embolism without acute cor pulmonale: Secondary | ICD-10-CM | POA: Diagnosis not present

## 2023-11-01 DIAGNOSIS — Z4682 Encounter for fitting and adjustment of non-vascular catheter: Secondary | ICD-10-CM | POA: Diagnosis not present

## 2023-11-01 DIAGNOSIS — I2724 Chronic thromboembolic pulmonary hypertension: Secondary | ICD-10-CM | POA: Diagnosis not present

## 2023-11-01 DIAGNOSIS — I3139 Other pericardial effusion (noninflammatory): Secondary | ICD-10-CM | POA: Diagnosis not present

## 2023-11-01 DIAGNOSIS — Z86711 Personal history of pulmonary embolism: Secondary | ICD-10-CM | POA: Diagnosis not present

## 2023-11-01 DIAGNOSIS — I9789 Other postprocedural complications and disorders of the circulatory system, not elsewhere classified: Secondary | ICD-10-CM | POA: Diagnosis not present

## 2023-11-01 DIAGNOSIS — I314 Cardiac tamponade: Secondary | ICD-10-CM | POA: Diagnosis not present

## 2023-11-01 DIAGNOSIS — D72829 Elevated white blood cell count, unspecified: Secondary | ICD-10-CM | POA: Diagnosis not present

## 2023-11-01 DIAGNOSIS — J811 Chronic pulmonary edema: Secondary | ICD-10-CM | POA: Diagnosis not present

## 2023-11-01 DIAGNOSIS — Z9281 Personal history of extracorporeal membrane oxygenation (ECMO): Secondary | ICD-10-CM | POA: Diagnosis not present

## 2023-11-02 DIAGNOSIS — N186 End stage renal disease: Secondary | ICD-10-CM | POA: Diagnosis not present

## 2023-11-02 DIAGNOSIS — D72829 Elevated white blood cell count, unspecified: Secondary | ICD-10-CM | POA: Diagnosis not present

## 2023-11-02 DIAGNOSIS — Z4682 Encounter for fitting and adjustment of non-vascular catheter: Secondary | ICD-10-CM | POA: Diagnosis not present

## 2023-11-02 DIAGNOSIS — J811 Chronic pulmonary edema: Secondary | ICD-10-CM | POA: Diagnosis not present

## 2023-11-02 DIAGNOSIS — Z9281 Personal history of extracorporeal membrane oxygenation (ECMO): Secondary | ICD-10-CM | POA: Diagnosis not present

## 2023-11-02 DIAGNOSIS — I2724 Chronic thromboembolic pulmonary hypertension: Secondary | ICD-10-CM | POA: Diagnosis not present

## 2023-11-02 DIAGNOSIS — Z452 Encounter for adjustment and management of vascular access device: Secondary | ICD-10-CM | POA: Diagnosis not present

## 2023-11-02 DIAGNOSIS — Z4659 Encounter for fitting and adjustment of other gastrointestinal appliance and device: Secondary | ICD-10-CM | POA: Diagnosis not present

## 2023-11-02 DIAGNOSIS — I2699 Other pulmonary embolism without acute cor pulmonale: Secondary | ICD-10-CM | POA: Diagnosis not present

## 2023-11-03 DIAGNOSIS — J811 Chronic pulmonary edema: Secondary | ICD-10-CM | POA: Diagnosis not present

## 2023-11-03 DIAGNOSIS — N179 Acute kidney failure, unspecified: Secondary | ICD-10-CM | POA: Diagnosis not present

## 2023-11-03 DIAGNOSIS — G8929 Other chronic pain: Secondary | ICD-10-CM | POA: Diagnosis not present

## 2023-11-03 DIAGNOSIS — K567 Ileus, unspecified: Secondary | ICD-10-CM | POA: Diagnosis not present

## 2023-11-03 DIAGNOSIS — J90 Pleural effusion, not elsewhere classified: Secondary | ICD-10-CM | POA: Diagnosis not present

## 2023-11-03 DIAGNOSIS — I2699 Other pulmonary embolism without acute cor pulmonale: Secondary | ICD-10-CM | POA: Diagnosis not present

## 2023-11-03 DIAGNOSIS — Z9281 Personal history of extracorporeal membrane oxygenation (ECMO): Secondary | ICD-10-CM | POA: Diagnosis not present

## 2023-11-03 DIAGNOSIS — R570 Cardiogenic shock: Secondary | ICD-10-CM | POA: Diagnosis not present

## 2023-11-03 DIAGNOSIS — Z4682 Encounter for fitting and adjustment of non-vascular catheter: Secondary | ICD-10-CM | POA: Diagnosis not present

## 2023-11-03 DIAGNOSIS — K6389 Other specified diseases of intestine: Secondary | ICD-10-CM | POA: Diagnosis not present

## 2023-11-03 DIAGNOSIS — I2724 Chronic thromboembolic pulmonary hypertension: Secondary | ICD-10-CM | POA: Diagnosis not present

## 2023-11-03 DIAGNOSIS — K3189 Other diseases of stomach and duodenum: Secondary | ICD-10-CM | POA: Diagnosis not present

## 2023-11-03 DIAGNOSIS — I509 Heart failure, unspecified: Secondary | ICD-10-CM | POA: Diagnosis not present

## 2023-11-03 DIAGNOSIS — K5989 Other specified functional intestinal disorders: Secondary | ICD-10-CM | POA: Diagnosis not present

## 2023-11-03 DIAGNOSIS — J9811 Atelectasis: Secondary | ICD-10-CM | POA: Diagnosis not present

## 2023-11-03 DIAGNOSIS — N186 End stage renal disease: Secondary | ICD-10-CM | POA: Diagnosis not present

## 2023-11-03 DIAGNOSIS — Z4659 Encounter for fitting and adjustment of other gastrointestinal appliance and device: Secondary | ICD-10-CM | POA: Diagnosis not present

## 2023-11-03 DIAGNOSIS — Z452 Encounter for adjustment and management of vascular access device: Secondary | ICD-10-CM | POA: Diagnosis not present

## 2023-11-03 DIAGNOSIS — Z940 Kidney transplant status: Secondary | ICD-10-CM | POA: Diagnosis not present

## 2023-11-03 DIAGNOSIS — D62 Acute posthemorrhagic anemia: Secondary | ICD-10-CM | POA: Diagnosis not present

## 2023-11-03 DIAGNOSIS — K5939 Other megacolon: Secondary | ICD-10-CM | POA: Diagnosis not present

## 2023-11-04 DIAGNOSIS — N179 Acute kidney failure, unspecified: Secondary | ICD-10-CM | POA: Diagnosis not present

## 2023-11-04 DIAGNOSIS — Z9889 Other specified postprocedural states: Secondary | ICD-10-CM | POA: Diagnosis not present

## 2023-11-04 DIAGNOSIS — D72829 Elevated white blood cell count, unspecified: Secondary | ICD-10-CM | POA: Diagnosis not present

## 2023-11-04 DIAGNOSIS — J811 Chronic pulmonary edema: Secondary | ICD-10-CM | POA: Diagnosis not present

## 2023-11-04 DIAGNOSIS — Z9281 Personal history of extracorporeal membrane oxygenation (ECMO): Secondary | ICD-10-CM | POA: Diagnosis not present

## 2023-11-04 DIAGNOSIS — I2724 Chronic thromboembolic pulmonary hypertension: Secondary | ICD-10-CM | POA: Diagnosis not present

## 2023-11-04 DIAGNOSIS — R5381 Other malaise: Secondary | ICD-10-CM | POA: Insufficient documentation

## 2023-11-04 DIAGNOSIS — N186 End stage renal disease: Secondary | ICD-10-CM | POA: Diagnosis not present

## 2023-11-04 DIAGNOSIS — D62 Acute posthemorrhagic anemia: Secondary | ICD-10-CM | POA: Diagnosis not present

## 2023-11-04 DIAGNOSIS — J90 Pleural effusion, not elsewhere classified: Secondary | ICD-10-CM | POA: Diagnosis not present

## 2023-11-04 DIAGNOSIS — R570 Cardiogenic shock: Secondary | ICD-10-CM | POA: Diagnosis not present

## 2023-11-04 DIAGNOSIS — J9811 Atelectasis: Secondary | ICD-10-CM | POA: Diagnosis not present

## 2023-11-04 DIAGNOSIS — Z940 Kidney transplant status: Secondary | ICD-10-CM | POA: Diagnosis not present

## 2023-11-04 DIAGNOSIS — I2699 Other pulmonary embolism without acute cor pulmonale: Secondary | ICD-10-CM | POA: Diagnosis not present

## 2023-11-04 DIAGNOSIS — G8929 Other chronic pain: Secondary | ICD-10-CM | POA: Diagnosis not present

## 2023-11-04 DIAGNOSIS — K567 Ileus, unspecified: Secondary | ICD-10-CM | POA: Diagnosis not present

## 2023-11-04 DIAGNOSIS — Z4659 Encounter for fitting and adjustment of other gastrointestinal appliance and device: Secondary | ICD-10-CM | POA: Diagnosis not present

## 2023-11-05 DIAGNOSIS — I2699 Other pulmonary embolism without acute cor pulmonale: Secondary | ICD-10-CM | POA: Diagnosis not present

## 2023-11-05 DIAGNOSIS — I2724 Chronic thromboembolic pulmonary hypertension: Secondary | ICD-10-CM | POA: Diagnosis not present

## 2023-11-05 DIAGNOSIS — J811 Chronic pulmonary edema: Secondary | ICD-10-CM | POA: Diagnosis not present

## 2023-11-05 DIAGNOSIS — G8929 Other chronic pain: Secondary | ICD-10-CM | POA: Diagnosis not present

## 2023-11-05 DIAGNOSIS — N179 Acute kidney failure, unspecified: Secondary | ICD-10-CM | POA: Diagnosis not present

## 2023-11-05 DIAGNOSIS — N186 End stage renal disease: Secondary | ICD-10-CM | POA: Diagnosis not present

## 2023-11-05 DIAGNOSIS — R570 Cardiogenic shock: Secondary | ICD-10-CM | POA: Diagnosis not present

## 2023-11-05 DIAGNOSIS — Z9889 Other specified postprocedural states: Secondary | ICD-10-CM | POA: Diagnosis not present

## 2023-11-05 DIAGNOSIS — Z940 Kidney transplant status: Secondary | ICD-10-CM | POA: Diagnosis not present

## 2023-11-05 DIAGNOSIS — D62 Acute posthemorrhagic anemia: Secondary | ICD-10-CM | POA: Diagnosis not present

## 2023-11-05 DIAGNOSIS — J9811 Atelectasis: Secondary | ICD-10-CM | POA: Diagnosis not present

## 2023-11-05 DIAGNOSIS — J90 Pleural effusion, not elsewhere classified: Secondary | ICD-10-CM | POA: Diagnosis not present

## 2023-11-06 DIAGNOSIS — K567 Ileus, unspecified: Secondary | ICD-10-CM | POA: Diagnosis not present

## 2023-11-06 DIAGNOSIS — J811 Chronic pulmonary edema: Secondary | ICD-10-CM | POA: Diagnosis not present

## 2023-11-06 DIAGNOSIS — G8929 Other chronic pain: Secondary | ICD-10-CM | POA: Diagnosis not present

## 2023-11-06 DIAGNOSIS — Z9281 Personal history of extracorporeal membrane oxygenation (ECMO): Secondary | ICD-10-CM | POA: Diagnosis not present

## 2023-11-06 DIAGNOSIS — D62 Acute posthemorrhagic anemia: Secondary | ICD-10-CM | POA: Diagnosis not present

## 2023-11-06 DIAGNOSIS — N179 Acute kidney failure, unspecified: Secondary | ICD-10-CM | POA: Diagnosis not present

## 2023-11-06 DIAGNOSIS — Z4659 Encounter for fitting and adjustment of other gastrointestinal appliance and device: Secondary | ICD-10-CM | POA: Diagnosis not present

## 2023-11-06 DIAGNOSIS — R918 Other nonspecific abnormal finding of lung field: Secondary | ICD-10-CM | POA: Diagnosis not present

## 2023-11-06 DIAGNOSIS — I2724 Chronic thromboembolic pulmonary hypertension: Secondary | ICD-10-CM | POA: Diagnosis not present

## 2023-11-06 DIAGNOSIS — I2699 Other pulmonary embolism without acute cor pulmonale: Secondary | ICD-10-CM | POA: Diagnosis not present

## 2023-11-06 DIAGNOSIS — R570 Cardiogenic shock: Secondary | ICD-10-CM | POA: Diagnosis not present

## 2023-11-06 DIAGNOSIS — I509 Heart failure, unspecified: Secondary | ICD-10-CM | POA: Diagnosis not present

## 2023-11-06 DIAGNOSIS — J9811 Atelectasis: Secondary | ICD-10-CM | POA: Diagnosis not present

## 2023-11-06 DIAGNOSIS — Z4682 Encounter for fitting and adjustment of non-vascular catheter: Secondary | ICD-10-CM | POA: Diagnosis not present

## 2023-11-06 DIAGNOSIS — N186 End stage renal disease: Secondary | ICD-10-CM | POA: Diagnosis not present

## 2023-11-06 DIAGNOSIS — Z452 Encounter for adjustment and management of vascular access device: Secondary | ICD-10-CM | POA: Diagnosis not present

## 2023-11-07 DIAGNOSIS — N186 End stage renal disease: Secondary | ICD-10-CM | POA: Diagnosis not present

## 2023-11-07 DIAGNOSIS — N179 Acute kidney failure, unspecified: Secondary | ICD-10-CM | POA: Diagnosis not present

## 2023-11-07 DIAGNOSIS — D72829 Elevated white blood cell count, unspecified: Secondary | ICD-10-CM | POA: Diagnosis not present

## 2023-11-07 DIAGNOSIS — G8929 Other chronic pain: Secondary | ICD-10-CM | POA: Diagnosis not present

## 2023-11-07 DIAGNOSIS — D62 Acute posthemorrhagic anemia: Secondary | ICD-10-CM | POA: Diagnosis not present

## 2023-11-07 DIAGNOSIS — I2724 Chronic thromboembolic pulmonary hypertension: Secondary | ICD-10-CM | POA: Diagnosis not present

## 2023-11-07 DIAGNOSIS — J9601 Acute respiratory failure with hypoxia: Secondary | ICD-10-CM | POA: Diagnosis not present

## 2023-11-07 DIAGNOSIS — R570 Cardiogenic shock: Secondary | ICD-10-CM | POA: Diagnosis not present

## 2023-11-07 DIAGNOSIS — R918 Other nonspecific abnormal finding of lung field: Secondary | ICD-10-CM | POA: Diagnosis not present

## 2023-11-07 DIAGNOSIS — Z0389 Encounter for observation for other suspected diseases and conditions ruled out: Secondary | ICD-10-CM | POA: Diagnosis not present

## 2023-11-07 DIAGNOSIS — I2699 Other pulmonary embolism without acute cor pulmonale: Secondary | ICD-10-CM | POA: Diagnosis not present

## 2023-11-07 DIAGNOSIS — K567 Ileus, unspecified: Secondary | ICD-10-CM | POA: Diagnosis not present

## 2023-11-08 DIAGNOSIS — I2699 Other pulmonary embolism without acute cor pulmonale: Secondary | ICD-10-CM | POA: Diagnosis not present

## 2023-11-08 DIAGNOSIS — N186 End stage renal disease: Secondary | ICD-10-CM | POA: Diagnosis not present

## 2023-11-08 DIAGNOSIS — I2724 Chronic thromboembolic pulmonary hypertension: Secondary | ICD-10-CM | POA: Diagnosis not present

## 2023-11-08 DIAGNOSIS — J90 Pleural effusion, not elsewhere classified: Secondary | ICD-10-CM | POA: Diagnosis not present

## 2023-11-08 DIAGNOSIS — N179 Acute kidney failure, unspecified: Secondary | ICD-10-CM | POA: Diagnosis not present

## 2023-11-08 DIAGNOSIS — R933 Abnormal findings on diagnostic imaging of other parts of digestive tract: Secondary | ICD-10-CM | POA: Diagnosis not present

## 2023-11-08 DIAGNOSIS — D72829 Elevated white blood cell count, unspecified: Secondary | ICD-10-CM | POA: Diagnosis not present

## 2023-11-08 DIAGNOSIS — G8929 Other chronic pain: Secondary | ICD-10-CM | POA: Diagnosis not present

## 2023-11-08 DIAGNOSIS — Z992 Dependence on renal dialysis: Secondary | ICD-10-CM | POA: Diagnosis not present

## 2023-11-08 DIAGNOSIS — D62 Acute posthemorrhagic anemia: Secondary | ICD-10-CM | POA: Diagnosis not present

## 2023-11-08 DIAGNOSIS — R918 Other nonspecific abnormal finding of lung field: Secondary | ICD-10-CM | POA: Diagnosis not present

## 2023-11-09 DIAGNOSIS — R918 Other nonspecific abnormal finding of lung field: Secondary | ICD-10-CM | POA: Diagnosis not present

## 2023-11-09 DIAGNOSIS — I3139 Other pericardial effusion (noninflammatory): Secondary | ICD-10-CM | POA: Diagnosis not present

## 2023-11-09 DIAGNOSIS — I519 Heart disease, unspecified: Secondary | ICD-10-CM | POA: Diagnosis not present

## 2023-11-09 DIAGNOSIS — Z4682 Encounter for fitting and adjustment of non-vascular catheter: Secondary | ICD-10-CM | POA: Diagnosis not present

## 2023-11-09 DIAGNOSIS — G8929 Other chronic pain: Secondary | ICD-10-CM | POA: Diagnosis not present

## 2023-11-09 DIAGNOSIS — N186 End stage renal disease: Secondary | ICD-10-CM | POA: Diagnosis not present

## 2023-11-09 DIAGNOSIS — I120 Hypertensive chronic kidney disease with stage 5 chronic kidney disease or end stage renal disease: Secondary | ICD-10-CM | POA: Diagnosis not present

## 2023-11-09 DIAGNOSIS — R931 Abnormal findings on diagnostic imaging of heart and coronary circulation: Secondary | ICD-10-CM | POA: Diagnosis not present

## 2023-11-09 DIAGNOSIS — D631 Anemia in chronic kidney disease: Secondary | ICD-10-CM | POA: Diagnosis not present

## 2023-11-09 DIAGNOSIS — J90 Pleural effusion, not elsewhere classified: Secondary | ICD-10-CM | POA: Diagnosis not present

## 2023-11-09 DIAGNOSIS — Z452 Encounter for adjustment and management of vascular access device: Secondary | ICD-10-CM | POA: Diagnosis not present

## 2023-11-09 DIAGNOSIS — I2724 Chronic thromboembolic pulmonary hypertension: Secondary | ICD-10-CM | POA: Diagnosis not present

## 2023-11-09 DIAGNOSIS — Z992 Dependence on renal dialysis: Secondary | ICD-10-CM | POA: Diagnosis not present

## 2023-11-10 DIAGNOSIS — R918 Other nonspecific abnormal finding of lung field: Secondary | ICD-10-CM | POA: Diagnosis not present

## 2023-11-10 DIAGNOSIS — I3139 Other pericardial effusion (noninflammatory): Secondary | ICD-10-CM | POA: Insufficient documentation

## 2023-11-10 DIAGNOSIS — N186 End stage renal disease: Secondary | ICD-10-CM | POA: Diagnosis not present

## 2023-11-10 DIAGNOSIS — Z452 Encounter for adjustment and management of vascular access device: Secondary | ICD-10-CM | POA: Diagnosis not present

## 2023-11-10 DIAGNOSIS — Z4682 Encounter for fitting and adjustment of non-vascular catheter: Secondary | ICD-10-CM | POA: Diagnosis not present

## 2023-11-10 DIAGNOSIS — J90 Pleural effusion, not elsewhere classified: Secondary | ICD-10-CM | POA: Diagnosis not present

## 2023-11-10 DIAGNOSIS — I2699 Other pulmonary embolism without acute cor pulmonale: Secondary | ICD-10-CM | POA: Diagnosis not present

## 2023-11-11 DIAGNOSIS — I2699 Other pulmonary embolism without acute cor pulmonale: Secondary | ICD-10-CM | POA: Diagnosis not present

## 2023-11-11 DIAGNOSIS — N186 End stage renal disease: Secondary | ICD-10-CM | POA: Diagnosis not present

## 2023-11-11 DIAGNOSIS — J90 Pleural effusion, not elsewhere classified: Secondary | ICD-10-CM | POA: Diagnosis not present

## 2023-11-11 DIAGNOSIS — Z452 Encounter for adjustment and management of vascular access device: Secondary | ICD-10-CM | POA: Diagnosis not present

## 2023-11-11 DIAGNOSIS — R918 Other nonspecific abnormal finding of lung field: Secondary | ICD-10-CM | POA: Diagnosis not present

## 2023-11-11 DIAGNOSIS — Z4682 Encounter for fitting and adjustment of non-vascular catheter: Secondary | ICD-10-CM | POA: Diagnosis not present

## 2023-11-12 DIAGNOSIS — E1122 Type 2 diabetes mellitus with diabetic chronic kidney disease: Secondary | ICD-10-CM | POA: Diagnosis not present

## 2023-11-12 DIAGNOSIS — I4891 Unspecified atrial fibrillation: Secondary | ICD-10-CM | POA: Diagnosis not present

## 2023-11-12 DIAGNOSIS — N186 End stage renal disease: Secondary | ICD-10-CM | POA: Diagnosis not present

## 2023-11-12 DIAGNOSIS — I272 Pulmonary hypertension, unspecified: Secondary | ICD-10-CM | POA: Diagnosis not present

## 2023-11-12 DIAGNOSIS — I2699 Other pulmonary embolism without acute cor pulmonale: Secondary | ICD-10-CM | POA: Diagnosis not present

## 2023-11-12 DIAGNOSIS — R570 Cardiogenic shock: Secondary | ICD-10-CM | POA: Diagnosis not present

## 2023-11-12 DIAGNOSIS — R918 Other nonspecific abnormal finding of lung field: Secondary | ICD-10-CM | POA: Diagnosis not present

## 2023-11-12 DIAGNOSIS — D631 Anemia in chronic kidney disease: Secondary | ICD-10-CM | POA: Diagnosis not present

## 2023-11-12 DIAGNOSIS — J90 Pleural effusion, not elsewhere classified: Secondary | ICD-10-CM | POA: Diagnosis not present

## 2023-11-12 DIAGNOSIS — Z992 Dependence on renal dialysis: Secondary | ICD-10-CM | POA: Diagnosis not present

## 2023-11-13 DIAGNOSIS — I10 Essential (primary) hypertension: Secondary | ICD-10-CM | POA: Diagnosis not present

## 2023-11-13 DIAGNOSIS — D631 Anemia in chronic kidney disease: Secondary | ICD-10-CM | POA: Diagnosis not present

## 2023-11-13 DIAGNOSIS — R918 Other nonspecific abnormal finding of lung field: Secondary | ICD-10-CM | POA: Diagnosis not present

## 2023-11-13 DIAGNOSIS — I509 Heart failure, unspecified: Secondary | ICD-10-CM | POA: Diagnosis not present

## 2023-11-13 DIAGNOSIS — D72829 Elevated white blood cell count, unspecified: Secondary | ICD-10-CM | POA: Diagnosis not present

## 2023-11-13 DIAGNOSIS — I4891 Unspecified atrial fibrillation: Secondary | ICD-10-CM | POA: Diagnosis not present

## 2023-11-13 DIAGNOSIS — Z992 Dependence on renal dialysis: Secondary | ICD-10-CM | POA: Diagnosis not present

## 2023-11-13 DIAGNOSIS — I071 Rheumatic tricuspid insufficiency: Secondary | ICD-10-CM | POA: Diagnosis not present

## 2023-11-13 DIAGNOSIS — I2699 Other pulmonary embolism without acute cor pulmonale: Secondary | ICD-10-CM | POA: Diagnosis not present

## 2023-11-13 DIAGNOSIS — N186 End stage renal disease: Secondary | ICD-10-CM | POA: Diagnosis not present

## 2023-11-13 DIAGNOSIS — E1122 Type 2 diabetes mellitus with diabetic chronic kidney disease: Secondary | ICD-10-CM | POA: Diagnosis not present

## 2023-11-13 DIAGNOSIS — T8249XA Other complication of vascular dialysis catheter, initial encounter: Secondary | ICD-10-CM | POA: Diagnosis not present

## 2023-11-13 DIAGNOSIS — R570 Cardiogenic shock: Secondary | ICD-10-CM | POA: Diagnosis not present

## 2023-11-13 DIAGNOSIS — I272 Pulmonary hypertension, unspecified: Secondary | ICD-10-CM | POA: Diagnosis not present

## 2023-11-13 DIAGNOSIS — Z452 Encounter for adjustment and management of vascular access device: Secondary | ICD-10-CM | POA: Diagnosis not present

## 2023-11-14 DIAGNOSIS — I2721 Secondary pulmonary arterial hypertension: Secondary | ICD-10-CM | POA: Diagnosis not present

## 2023-11-14 DIAGNOSIS — I2699 Other pulmonary embolism without acute cor pulmonale: Secondary | ICD-10-CM | POA: Diagnosis not present

## 2023-11-14 DIAGNOSIS — Z992 Dependence on renal dialysis: Secondary | ICD-10-CM | POA: Diagnosis not present

## 2023-11-14 DIAGNOSIS — I519 Heart disease, unspecified: Secondary | ICD-10-CM | POA: Diagnosis not present

## 2023-11-14 DIAGNOSIS — I959 Hypotension, unspecified: Secondary | ICD-10-CM | POA: Diagnosis not present

## 2023-11-14 DIAGNOSIS — I4891 Unspecified atrial fibrillation: Secondary | ICD-10-CM | POA: Diagnosis not present

## 2023-11-14 DIAGNOSIS — N186 End stage renal disease: Secondary | ICD-10-CM | POA: Diagnosis not present

## 2023-11-14 DIAGNOSIS — E1122 Type 2 diabetes mellitus with diabetic chronic kidney disease: Secondary | ICD-10-CM | POA: Diagnosis not present

## 2023-11-14 DIAGNOSIS — I82412 Acute embolism and thrombosis of left femoral vein: Secondary | ICD-10-CM | POA: Diagnosis not present

## 2023-11-15 DIAGNOSIS — I2699 Other pulmonary embolism without acute cor pulmonale: Secondary | ICD-10-CM | POA: Diagnosis not present

## 2023-11-15 DIAGNOSIS — I2721 Secondary pulmonary arterial hypertension: Secondary | ICD-10-CM | POA: Diagnosis not present

## 2023-11-15 DIAGNOSIS — I4891 Unspecified atrial fibrillation: Secondary | ICD-10-CM | POA: Diagnosis not present

## 2023-11-15 DIAGNOSIS — N186 End stage renal disease: Secondary | ICD-10-CM | POA: Diagnosis not present

## 2023-11-15 DIAGNOSIS — E1122 Type 2 diabetes mellitus with diabetic chronic kidney disease: Secondary | ICD-10-CM | POA: Diagnosis not present

## 2023-11-15 DIAGNOSIS — I959 Hypotension, unspecified: Secondary | ICD-10-CM | POA: Diagnosis not present

## 2023-11-15 DIAGNOSIS — I519 Heart disease, unspecified: Secondary | ICD-10-CM | POA: Diagnosis not present

## 2023-11-15 DIAGNOSIS — Z992 Dependence on renal dialysis: Secondary | ICD-10-CM | POA: Diagnosis not present

## 2023-11-15 DIAGNOSIS — I82412 Acute embolism and thrombosis of left femoral vein: Secondary | ICD-10-CM | POA: Diagnosis not present

## 2023-11-16 DIAGNOSIS — D72829 Elevated white blood cell count, unspecified: Secondary | ICD-10-CM | POA: Diagnosis not present

## 2023-11-16 DIAGNOSIS — I2782 Chronic pulmonary embolism: Secondary | ICD-10-CM | POA: Diagnosis not present

## 2023-11-16 DIAGNOSIS — I519 Heart disease, unspecified: Secondary | ICD-10-CM | POA: Diagnosis not present

## 2023-11-16 DIAGNOSIS — I4891 Unspecified atrial fibrillation: Secondary | ICD-10-CM | POA: Diagnosis not present

## 2023-11-16 DIAGNOSIS — E1122 Type 2 diabetes mellitus with diabetic chronic kidney disease: Secondary | ICD-10-CM | POA: Diagnosis not present

## 2023-11-16 DIAGNOSIS — I2609 Other pulmonary embolism with acute cor pulmonale: Secondary | ICD-10-CM | POA: Diagnosis not present

## 2023-11-16 DIAGNOSIS — N186 End stage renal disease: Secondary | ICD-10-CM | POA: Diagnosis not present

## 2023-11-16 DIAGNOSIS — I2721 Secondary pulmonary arterial hypertension: Secondary | ICD-10-CM | POA: Diagnosis not present

## 2023-11-16 DIAGNOSIS — I2699 Other pulmonary embolism without acute cor pulmonale: Secondary | ICD-10-CM | POA: Diagnosis not present

## 2023-11-16 DIAGNOSIS — Z992 Dependence on renal dialysis: Secondary | ICD-10-CM | POA: Diagnosis not present

## 2023-11-16 DIAGNOSIS — I959 Hypotension, unspecified: Secondary | ICD-10-CM | POA: Diagnosis not present

## 2023-11-16 DIAGNOSIS — I82412 Acute embolism and thrombosis of left femoral vein: Secondary | ICD-10-CM | POA: Diagnosis not present

## 2023-11-17 DIAGNOSIS — I2699 Other pulmonary embolism without acute cor pulmonale: Secondary | ICD-10-CM | POA: Diagnosis not present

## 2023-11-17 DIAGNOSIS — I519 Heart disease, unspecified: Secondary | ICD-10-CM | POA: Diagnosis not present

## 2023-11-17 DIAGNOSIS — N186 End stage renal disease: Secondary | ICD-10-CM | POA: Diagnosis not present

## 2023-11-17 DIAGNOSIS — I2721 Secondary pulmonary arterial hypertension: Secondary | ICD-10-CM | POA: Diagnosis not present

## 2023-11-17 DIAGNOSIS — E1122 Type 2 diabetes mellitus with diabetic chronic kidney disease: Secondary | ICD-10-CM | POA: Diagnosis not present

## 2023-11-17 DIAGNOSIS — I4891 Unspecified atrial fibrillation: Secondary | ICD-10-CM | POA: Diagnosis not present

## 2023-11-17 DIAGNOSIS — I82412 Acute embolism and thrombosis of left femoral vein: Secondary | ICD-10-CM | POA: Diagnosis not present

## 2023-11-17 DIAGNOSIS — I2724 Chronic thromboembolic pulmonary hypertension: Secondary | ICD-10-CM | POA: Diagnosis not present

## 2023-11-17 DIAGNOSIS — Z992 Dependence on renal dialysis: Secondary | ICD-10-CM | POA: Diagnosis not present

## 2023-11-17 DIAGNOSIS — D72829 Elevated white blood cell count, unspecified: Secondary | ICD-10-CM | POA: Diagnosis not present

## 2023-11-17 DIAGNOSIS — I959 Hypotension, unspecified: Secondary | ICD-10-CM | POA: Diagnosis not present

## 2023-11-18 DIAGNOSIS — J90 Pleural effusion, not elsewhere classified: Secondary | ICD-10-CM | POA: Diagnosis not present

## 2023-11-18 DIAGNOSIS — I2699 Other pulmonary embolism without acute cor pulmonale: Secondary | ICD-10-CM | POA: Diagnosis not present

## 2023-11-18 DIAGNOSIS — Z992 Dependence on renal dialysis: Secondary | ICD-10-CM | POA: Diagnosis not present

## 2023-11-18 DIAGNOSIS — I82412 Acute embolism and thrombosis of left femoral vein: Secondary | ICD-10-CM | POA: Diagnosis not present

## 2023-11-18 DIAGNOSIS — D631 Anemia in chronic kidney disease: Secondary | ICD-10-CM | POA: Diagnosis not present

## 2023-11-18 DIAGNOSIS — G4700 Insomnia, unspecified: Secondary | ICD-10-CM | POA: Diagnosis not present

## 2023-11-18 DIAGNOSIS — N186 End stage renal disease: Secondary | ICD-10-CM | POA: Diagnosis not present

## 2023-11-18 DIAGNOSIS — I96 Gangrene, not elsewhere classified: Secondary | ICD-10-CM | POA: Diagnosis not present

## 2023-11-18 DIAGNOSIS — Z789 Other specified health status: Secondary | ICD-10-CM | POA: Diagnosis not present

## 2023-11-18 DIAGNOSIS — I2721 Secondary pulmonary arterial hypertension: Secondary | ICD-10-CM | POA: Diagnosis not present

## 2023-11-18 DIAGNOSIS — I272 Pulmonary hypertension, unspecified: Secondary | ICD-10-CM | POA: Diagnosis not present

## 2023-11-18 DIAGNOSIS — N189 Chronic kidney disease, unspecified: Secondary | ICD-10-CM | POA: Diagnosis not present

## 2023-11-18 DIAGNOSIS — I4891 Unspecified atrial fibrillation: Secondary | ICD-10-CM | POA: Diagnosis not present

## 2023-11-18 DIAGNOSIS — D72829 Elevated white blood cell count, unspecified: Secondary | ICD-10-CM | POA: Diagnosis not present

## 2023-11-18 DIAGNOSIS — F32A Depression, unspecified: Secondary | ICD-10-CM | POA: Diagnosis not present

## 2023-11-18 DIAGNOSIS — I519 Heart disease, unspecified: Secondary | ICD-10-CM | POA: Diagnosis not present

## 2023-11-18 DIAGNOSIS — I120 Hypertensive chronic kidney disease with stage 5 chronic kidney disease or end stage renal disease: Secondary | ICD-10-CM | POA: Diagnosis not present

## 2023-11-18 DIAGNOSIS — E1122 Type 2 diabetes mellitus with diabetic chronic kidney disease: Secondary | ICD-10-CM | POA: Diagnosis not present

## 2023-11-18 DIAGNOSIS — I959 Hypotension, unspecified: Secondary | ICD-10-CM | POA: Diagnosis not present

## 2023-11-19 DIAGNOSIS — G4700 Insomnia, unspecified: Secondary | ICD-10-CM | POA: Diagnosis not present

## 2023-11-19 DIAGNOSIS — I272 Pulmonary hypertension, unspecified: Secondary | ICD-10-CM | POA: Diagnosis not present

## 2023-11-19 DIAGNOSIS — I82412 Acute embolism and thrombosis of left femoral vein: Secondary | ICD-10-CM | POA: Diagnosis not present

## 2023-11-19 DIAGNOSIS — N186 End stage renal disease: Secondary | ICD-10-CM | POA: Diagnosis not present

## 2023-11-19 DIAGNOSIS — I96 Gangrene, not elsewhere classified: Secondary | ICD-10-CM | POA: Diagnosis not present

## 2023-11-19 DIAGNOSIS — I2699 Other pulmonary embolism without acute cor pulmonale: Secondary | ICD-10-CM | POA: Diagnosis not present

## 2023-11-19 DIAGNOSIS — F32A Depression, unspecified: Secondary | ICD-10-CM | POA: Diagnosis not present

## 2023-11-19 DIAGNOSIS — J90 Pleural effusion, not elsewhere classified: Secondary | ICD-10-CM | POA: Diagnosis not present

## 2023-11-19 DIAGNOSIS — I120 Hypertensive chronic kidney disease with stage 5 chronic kidney disease or end stage renal disease: Secondary | ICD-10-CM | POA: Diagnosis not present

## 2023-11-20 DIAGNOSIS — Z992 Dependence on renal dialysis: Secondary | ICD-10-CM | POA: Diagnosis not present

## 2023-11-20 DIAGNOSIS — J90 Pleural effusion, not elsewhere classified: Secondary | ICD-10-CM | POA: Diagnosis not present

## 2023-11-20 DIAGNOSIS — I82412 Acute embolism and thrombosis of left femoral vein: Secondary | ICD-10-CM | POA: Diagnosis not present

## 2023-11-20 DIAGNOSIS — I272 Pulmonary hypertension, unspecified: Secondary | ICD-10-CM | POA: Diagnosis not present

## 2023-11-20 DIAGNOSIS — I120 Hypertensive chronic kidney disease with stage 5 chronic kidney disease or end stage renal disease: Secondary | ICD-10-CM | POA: Diagnosis not present

## 2023-11-20 DIAGNOSIS — I2699 Other pulmonary embolism without acute cor pulmonale: Secondary | ICD-10-CM | POA: Diagnosis not present

## 2023-11-20 DIAGNOSIS — G4700 Insomnia, unspecified: Secondary | ICD-10-CM | POA: Diagnosis not present

## 2023-11-20 DIAGNOSIS — N186 End stage renal disease: Secondary | ICD-10-CM | POA: Diagnosis not present

## 2023-11-20 DIAGNOSIS — F32A Depression, unspecified: Secondary | ICD-10-CM | POA: Diagnosis not present

## 2023-11-20 DIAGNOSIS — I96 Gangrene, not elsewhere classified: Secondary | ICD-10-CM | POA: Diagnosis not present

## 2023-11-21 DIAGNOSIS — I959 Hypotension, unspecified: Secondary | ICD-10-CM | POA: Diagnosis not present

## 2023-11-21 DIAGNOSIS — D72829 Elevated white blood cell count, unspecified: Secondary | ICD-10-CM | POA: Diagnosis not present

## 2023-11-21 DIAGNOSIS — N186 End stage renal disease: Secondary | ICD-10-CM | POA: Diagnosis not present

## 2023-11-21 DIAGNOSIS — Z9281 Personal history of extracorporeal membrane oxygenation (ECMO): Secondary | ICD-10-CM | POA: Diagnosis not present

## 2023-11-21 DIAGNOSIS — I2699 Other pulmonary embolism without acute cor pulmonale: Secondary | ICD-10-CM | POA: Diagnosis not present

## 2023-11-21 DIAGNOSIS — Z9889 Other specified postprocedural states: Secondary | ICD-10-CM | POA: Diagnosis not present

## 2023-11-21 DIAGNOSIS — I10 Essential (primary) hypertension: Secondary | ICD-10-CM | POA: Diagnosis not present

## 2023-11-21 DIAGNOSIS — I509 Heart failure, unspecified: Secondary | ICD-10-CM | POA: Diagnosis not present

## 2023-11-22 DIAGNOSIS — D72829 Elevated white blood cell count, unspecified: Secondary | ICD-10-CM | POA: Diagnosis not present

## 2023-11-22 DIAGNOSIS — N186 End stage renal disease: Secondary | ICD-10-CM | POA: Diagnosis not present

## 2023-11-22 DIAGNOSIS — I2699 Other pulmonary embolism without acute cor pulmonale: Secondary | ICD-10-CM | POA: Diagnosis not present

## 2023-11-22 DIAGNOSIS — Z9889 Other specified postprocedural states: Secondary | ICD-10-CM | POA: Diagnosis not present

## 2023-11-22 DIAGNOSIS — I959 Hypotension, unspecified: Secondary | ICD-10-CM | POA: Diagnosis not present

## 2023-11-22 DIAGNOSIS — Z9281 Personal history of extracorporeal membrane oxygenation (ECMO): Secondary | ICD-10-CM | POA: Diagnosis not present

## 2023-11-22 DIAGNOSIS — I509 Heart failure, unspecified: Secondary | ICD-10-CM | POA: Diagnosis not present

## 2023-11-22 DIAGNOSIS — I10 Essential (primary) hypertension: Secondary | ICD-10-CM | POA: Diagnosis not present

## 2023-11-23 DIAGNOSIS — D72829 Elevated white blood cell count, unspecified: Secondary | ICD-10-CM | POA: Diagnosis not present

## 2023-11-23 DIAGNOSIS — I82611 Acute embolism and thrombosis of superficial veins of right upper extremity: Secondary | ICD-10-CM | POA: Diagnosis not present

## 2023-11-23 DIAGNOSIS — I959 Hypotension, unspecified: Secondary | ICD-10-CM | POA: Diagnosis not present

## 2023-11-23 DIAGNOSIS — N186 End stage renal disease: Secondary | ICD-10-CM | POA: Diagnosis not present

## 2023-11-23 DIAGNOSIS — D631 Anemia in chronic kidney disease: Secondary | ICD-10-CM | POA: Diagnosis not present

## 2023-11-23 DIAGNOSIS — I2699 Other pulmonary embolism without acute cor pulmonale: Secondary | ICD-10-CM | POA: Diagnosis not present

## 2023-11-23 DIAGNOSIS — Z789 Other specified health status: Secondary | ICD-10-CM | POA: Diagnosis not present

## 2023-11-23 DIAGNOSIS — I50810 Right heart failure, unspecified: Secondary | ICD-10-CM | POA: Diagnosis not present

## 2023-11-23 DIAGNOSIS — I2724 Chronic thromboembolic pulmonary hypertension: Secondary | ICD-10-CM | POA: Diagnosis not present

## 2023-11-23 DIAGNOSIS — Z9281 Personal history of extracorporeal membrane oxygenation (ECMO): Secondary | ICD-10-CM | POA: Diagnosis not present

## 2023-11-23 DIAGNOSIS — I509 Heart failure, unspecified: Secondary | ICD-10-CM | POA: Diagnosis not present

## 2023-11-23 DIAGNOSIS — I10 Essential (primary) hypertension: Secondary | ICD-10-CM | POA: Diagnosis not present

## 2023-11-23 DIAGNOSIS — N189 Chronic kidney disease, unspecified: Secondary | ICD-10-CM | POA: Diagnosis not present

## 2023-11-23 DIAGNOSIS — Z9889 Other specified postprocedural states: Secondary | ICD-10-CM | POA: Diagnosis not present

## 2023-11-24 DIAGNOSIS — N186 End stage renal disease: Secondary | ICD-10-CM | POA: Diagnosis not present

## 2023-11-24 DIAGNOSIS — Z789 Other specified health status: Secondary | ICD-10-CM | POA: Diagnosis not present

## 2023-11-24 DIAGNOSIS — D72829 Elevated white blood cell count, unspecified: Secondary | ICD-10-CM | POA: Diagnosis not present

## 2023-11-24 DIAGNOSIS — D631 Anemia in chronic kidney disease: Secondary | ICD-10-CM | POA: Diagnosis not present

## 2023-11-24 DIAGNOSIS — N189 Chronic kidney disease, unspecified: Secondary | ICD-10-CM | POA: Diagnosis not present

## 2023-11-24 DIAGNOSIS — I10 Essential (primary) hypertension: Secondary | ICD-10-CM | POA: Diagnosis not present

## 2023-11-24 DIAGNOSIS — E8770 Fluid overload, unspecified: Secondary | ICD-10-CM | POA: Diagnosis not present

## 2023-11-24 DIAGNOSIS — Z9889 Other specified postprocedural states: Secondary | ICD-10-CM | POA: Diagnosis not present

## 2023-11-24 DIAGNOSIS — I2699 Other pulmonary embolism without acute cor pulmonale: Secondary | ICD-10-CM | POA: Diagnosis not present

## 2023-11-24 DIAGNOSIS — I959 Hypotension, unspecified: Secondary | ICD-10-CM | POA: Diagnosis not present

## 2023-11-24 DIAGNOSIS — I509 Heart failure, unspecified: Secondary | ICD-10-CM | POA: Diagnosis not present

## 2023-11-24 DIAGNOSIS — Z9281 Personal history of extracorporeal membrane oxygenation (ECMO): Secondary | ICD-10-CM | POA: Diagnosis not present

## 2023-11-25 DIAGNOSIS — Z9889 Other specified postprocedural states: Secondary | ICD-10-CM | POA: Diagnosis not present

## 2023-11-25 DIAGNOSIS — I10 Essential (primary) hypertension: Secondary | ICD-10-CM | POA: Diagnosis not present

## 2023-11-25 DIAGNOSIS — I509 Heart failure, unspecified: Secondary | ICD-10-CM | POA: Diagnosis not present

## 2023-11-25 DIAGNOSIS — Z9281 Personal history of extracorporeal membrane oxygenation (ECMO): Secondary | ICD-10-CM | POA: Diagnosis not present

## 2023-11-25 DIAGNOSIS — N186 End stage renal disease: Secondary | ICD-10-CM | POA: Diagnosis not present

## 2023-11-25 DIAGNOSIS — Z789 Other specified health status: Secondary | ICD-10-CM | POA: Diagnosis not present

## 2023-11-25 DIAGNOSIS — N189 Chronic kidney disease, unspecified: Secondary | ICD-10-CM | POA: Diagnosis not present

## 2023-11-25 DIAGNOSIS — E8770 Fluid overload, unspecified: Secondary | ICD-10-CM | POA: Diagnosis not present

## 2023-11-25 DIAGNOSIS — D72829 Elevated white blood cell count, unspecified: Secondary | ICD-10-CM | POA: Diagnosis not present

## 2023-11-25 DIAGNOSIS — I2699 Other pulmonary embolism without acute cor pulmonale: Secondary | ICD-10-CM | POA: Diagnosis not present

## 2023-11-25 DIAGNOSIS — I959 Hypotension, unspecified: Secondary | ICD-10-CM | POA: Diagnosis not present

## 2023-11-25 DIAGNOSIS — D631 Anemia in chronic kidney disease: Secondary | ICD-10-CM | POA: Diagnosis not present

## 2023-11-26 DIAGNOSIS — E8770 Fluid overload, unspecified: Secondary | ICD-10-CM | POA: Diagnosis not present

## 2023-11-26 DIAGNOSIS — I959 Hypotension, unspecified: Secondary | ICD-10-CM | POA: Diagnosis not present

## 2023-11-26 DIAGNOSIS — I10 Essential (primary) hypertension: Secondary | ICD-10-CM | POA: Diagnosis not present

## 2023-11-26 DIAGNOSIS — I509 Heart failure, unspecified: Secondary | ICD-10-CM | POA: Diagnosis not present

## 2023-11-26 DIAGNOSIS — Z789 Other specified health status: Secondary | ICD-10-CM | POA: Diagnosis not present

## 2023-11-26 DIAGNOSIS — Z9281 Personal history of extracorporeal membrane oxygenation (ECMO): Secondary | ICD-10-CM | POA: Diagnosis not present

## 2023-11-26 DIAGNOSIS — D631 Anemia in chronic kidney disease: Secondary | ICD-10-CM | POA: Diagnosis not present

## 2023-11-26 DIAGNOSIS — N189 Chronic kidney disease, unspecified: Secondary | ICD-10-CM | POA: Diagnosis not present

## 2023-11-26 DIAGNOSIS — N186 End stage renal disease: Secondary | ICD-10-CM | POA: Diagnosis not present

## 2023-11-26 DIAGNOSIS — D72829 Elevated white blood cell count, unspecified: Secondary | ICD-10-CM | POA: Diagnosis not present

## 2023-11-26 DIAGNOSIS — I2699 Other pulmonary embolism without acute cor pulmonale: Secondary | ICD-10-CM | POA: Diagnosis not present

## 2023-11-26 DIAGNOSIS — Z9889 Other specified postprocedural states: Secondary | ICD-10-CM | POA: Diagnosis not present

## 2023-11-27 DIAGNOSIS — I959 Hypotension, unspecified: Secondary | ICD-10-CM | POA: Diagnosis not present

## 2023-11-27 DIAGNOSIS — D631 Anemia in chronic kidney disease: Secondary | ICD-10-CM | POA: Diagnosis not present

## 2023-11-27 DIAGNOSIS — Z9281 Personal history of extracorporeal membrane oxygenation (ECMO): Secondary | ICD-10-CM | POA: Diagnosis not present

## 2023-11-27 DIAGNOSIS — N189 Chronic kidney disease, unspecified: Secondary | ICD-10-CM | POA: Diagnosis not present

## 2023-11-27 DIAGNOSIS — E8770 Fluid overload, unspecified: Secondary | ICD-10-CM | POA: Diagnosis not present

## 2023-11-27 DIAGNOSIS — I10 Essential (primary) hypertension: Secondary | ICD-10-CM | POA: Diagnosis not present

## 2023-11-27 DIAGNOSIS — Z789 Other specified health status: Secondary | ICD-10-CM | POA: Diagnosis not present

## 2023-11-27 DIAGNOSIS — Z9889 Other specified postprocedural states: Secondary | ICD-10-CM | POA: Diagnosis not present

## 2023-11-27 DIAGNOSIS — I2699 Other pulmonary embolism without acute cor pulmonale: Secondary | ICD-10-CM | POA: Diagnosis not present

## 2023-11-27 DIAGNOSIS — N186 End stage renal disease: Secondary | ICD-10-CM | POA: Diagnosis not present

## 2023-11-27 DIAGNOSIS — D72829 Elevated white blood cell count, unspecified: Secondary | ICD-10-CM | POA: Diagnosis not present

## 2023-11-27 DIAGNOSIS — I509 Heart failure, unspecified: Secondary | ICD-10-CM | POA: Diagnosis not present

## 2023-11-28 DIAGNOSIS — N2581 Secondary hyperparathyroidism of renal origin: Secondary | ICD-10-CM | POA: Diagnosis not present

## 2023-11-28 DIAGNOSIS — N186 End stage renal disease: Secondary | ICD-10-CM | POA: Diagnosis not present

## 2023-11-28 DIAGNOSIS — Z992 Dependence on renal dialysis: Secondary | ICD-10-CM | POA: Diagnosis not present

## 2023-11-29 DIAGNOSIS — I509 Heart failure, unspecified: Secondary | ICD-10-CM | POA: Diagnosis not present

## 2023-11-29 DIAGNOSIS — E1122 Type 2 diabetes mellitus with diabetic chronic kidney disease: Secondary | ICD-10-CM | POA: Diagnosis not present

## 2023-11-29 DIAGNOSIS — N186 End stage renal disease: Secondary | ICD-10-CM | POA: Diagnosis not present

## 2023-11-29 DIAGNOSIS — I2724 Chronic thromboembolic pulmonary hypertension: Secondary | ICD-10-CM | POA: Diagnosis not present

## 2023-11-29 DIAGNOSIS — Z992 Dependence on renal dialysis: Secondary | ICD-10-CM | POA: Diagnosis not present

## 2023-11-29 DIAGNOSIS — T8619 Other complication of kidney transplant: Secondary | ICD-10-CM | POA: Diagnosis not present

## 2023-11-29 DIAGNOSIS — E1142 Type 2 diabetes mellitus with diabetic polyneuropathy: Secondary | ICD-10-CM | POA: Diagnosis not present

## 2023-11-29 DIAGNOSIS — I480 Paroxysmal atrial fibrillation: Secondary | ICD-10-CM | POA: Diagnosis not present

## 2023-11-29 DIAGNOSIS — I132 Hypertensive heart and chronic kidney disease with heart failure and with stage 5 chronic kidney disease, or end stage renal disease: Secondary | ICD-10-CM | POA: Diagnosis not present

## 2023-11-30 ENCOUNTER — Telehealth: Payer: Self-pay

## 2023-11-30 DIAGNOSIS — I480 Paroxysmal atrial fibrillation: Secondary | ICD-10-CM | POA: Diagnosis not present

## 2023-11-30 DIAGNOSIS — Z992 Dependence on renal dialysis: Secondary | ICD-10-CM | POA: Diagnosis not present

## 2023-11-30 DIAGNOSIS — E1142 Type 2 diabetes mellitus with diabetic polyneuropathy: Secondary | ICD-10-CM | POA: Diagnosis not present

## 2023-11-30 DIAGNOSIS — T8619 Other complication of kidney transplant: Secondary | ICD-10-CM | POA: Diagnosis not present

## 2023-11-30 DIAGNOSIS — I2724 Chronic thromboembolic pulmonary hypertension: Secondary | ICD-10-CM | POA: Diagnosis not present

## 2023-11-30 DIAGNOSIS — I509 Heart failure, unspecified: Secondary | ICD-10-CM | POA: Diagnosis not present

## 2023-11-30 DIAGNOSIS — E1122 Type 2 diabetes mellitus with diabetic chronic kidney disease: Secondary | ICD-10-CM | POA: Diagnosis not present

## 2023-11-30 DIAGNOSIS — I132 Hypertensive heart and chronic kidney disease with heart failure and with stage 5 chronic kidney disease, or end stage renal disease: Secondary | ICD-10-CM | POA: Diagnosis not present

## 2023-11-30 DIAGNOSIS — N186 End stage renal disease: Secondary | ICD-10-CM | POA: Diagnosis not present

## 2023-11-30 NOTE — Transitions of Care (Post Inpatient/ED Visit) (Signed)
   11/30/2023  Name: [name] MRN: [id number] DOB: April 21, 1976  Today's TOC FU Call Status: Today's TOC FU Call Status:: Unsuccessful Call (1st Attempt) Unsuccessful Call (1st Attempt) Date: 11/30/23  Attempted to reach the patient regarding the most recent Inpatient/ED visit.  Follow Up Plan: Additional outreach attempts will be made to reach the patient to complete the Transitions of Care (Post Inpatient/ED visit) call.   Alan Ee, RN, BSN, CEN [employer]- Transition of Care Team.  [facility] [phone]

## 2023-11-30 NOTE — Telephone Encounter (Signed)
 Copied from CRM [id number]. Topic: Clinical - Home Health Verbal Orders >> Nov 30, 2023  9:30 AM Berneda FALCON wrote: Caller/Agency: Red[facility] PT Callback Number: [phone] Service Requested: Physical Therapy Frequency: 2w4 1w4 Any new concerns about the patient? No

## 2023-11-30 NOTE — Telephone Encounter (Signed)
 Is it okay for PT orders? Thanks. Dm/cma

## 2023-12-01 ENCOUNTER — Telehealth: Payer: Self-pay | Admitting: *Deleted

## 2023-12-01 DIAGNOSIS — Z992 Dependence on renal dialysis: Secondary | ICD-10-CM | POA: Diagnosis not present

## 2023-12-01 DIAGNOSIS — N186 End stage renal disease: Secondary | ICD-10-CM | POA: Diagnosis not present

## 2023-12-01 DIAGNOSIS — N2581 Secondary hyperparathyroidism of renal origin: Secondary | ICD-10-CM | POA: Diagnosis not present

## 2023-12-01 NOTE — Telephone Encounter (Signed)
Left VM to rtn call. Dm/cma       

## 2023-12-01 NOTE — Transitions of Care (Post Inpatient/ED Visit) (Signed)
 12/01/2023  Name: [name] MRN: [id number] DOB: Sep 03, 1975  Today's TOC FU Call Status: Today's TOC FU Call Status:: Successful TOC FU Call Completed TOC FU Call Complete Date: 12/01/23 Patient's Name and Date of Birth confirmed.  Transition Care Management Follow-up Telephone Call Date of Discharge: 11/27/23 Discharge Facility: Other ([facility]) Name of Other ([facility]) Discharge Facility: [facility] Type of Discharge: Inpatient Admission Primary Inpatient Discharge Diagnosis:: ESRD/Hypoxia/Bilateral pulmonary embolism How have you been since you were released from the hospital?: Better Any questions or concerns?: No  Items Reviewed: Did you receive and understand the discharge instructions provided?: Yes Medications obtained,verified, and reconciled?: Yes (Medications Reviewed) Any new allergies since your discharge?: No Dietary orders reviewed?: No Do you have support at home?: Yes People in Home [RPT]: alone Name of Support/Comfort Primary Source: roy father and friend  Medications Reviewed Today: Medications Reviewed Today     Reviewed by Kennieth Cathlean DEL, RN (Case Manager) on 12/01/23 at 1406  Med List Status: <None>   Medication Order Taking? Sig Documenting Provider Last Dose Status Informant  apixaban  (ELIQUIS ) 2.5 MG TABS tablet [id number] Yes Take 5 mg by mouth 2 (two) times daily. [provider]  Active Self, Pharmacy Records           Med Note ROZENA, WAYLON Repress Jul 26, 2023  3:43 PM) Patient confirms Madie has him on eliquis  2.5mg  BID, not 5mg   aspirin  EC 81 MG tablet [id number] Yes Take 81 mg by mouth daily. [provider]  Active Self, Pharmacy Records  atorvastatin  (LIPITOR) 40 MG tablet [id number] Yes Take 1 tablet (40 mg total) by mouth daily. Thedora Garnette CHRISTELLA, MD  Active Self, Pharmacy Records  b complex-vitamin c-folic acid  (NEPHRO-VITE) 0.8 MG TABS tablet [id number] Yes Take 1 tablet by mouth daily.  [provider]  Active   clopidogrel  (PLAVIX ) 75 MG tablet [id number] Yes Take 75 mg by mouth daily. [provider]  Active Self, Pharmacy Records  epoetin  alfa (EPOGEN) 4000 UNIT/ML injection [id number] Yes Inject 4,000 Units into the vein 3 (three) times a week. [provider]  Active   ferric citrate  (AURYXIA ) 1 GM 210 MG(Fe) tablet [id number] Yes Take 210 mg by mouth 3 (three) times daily with meals. [provider]  Active Self, Pharmacy Records           Med Note DINO, [facility]   Wed Dec 31, 2022  4:33 PM)    folic acid  (FOLVITE ) 1 MG tablet [id number] Yes Take 1 mg by mouth daily. [provider]  Active   sodium zirconium cyclosilicate  (LOKELMA ) 10 g PACK packet [id number] Yes Take 10 g by mouth See admin instructions. Take 10g by mouth once daily on non-dialysis days [provider]  Active Self, Pharmacy Records            Home Care and Equipment/Supplies: Were Home Health Services Ordered?: Yes Name of Home Health Agency:: [facility] Has Agency set up a time to come to your home?: Yes First Home Health Visit Date: 11/30/23 Any new equipment or medical supplies ordered?: NA  Functional Questionnaire: Do you need assistance with bathing/showering or dressing?: No Do you need assistance with meal preparation?: No Do you need assistance with eating?: No Do you have difficulty maintaining continence: No Do you need assistance with getting out of bed/getting out of a chair/moving?: No Do you have difficulty managing or taking your medications?: No  Follow up appointments reviewed: PCP Follow-up appointment confirmed?: Yes  Date of PCP follow-up appointment?: 12/07/23 Follow-up Provider: Dr Garnette Thedora Driscilla Lionel Follow-up appointment confirmed?: Yes Follow-Up Specialty Provider:: Dr Cyrilla Do you need transportation to your follow-up appointment?: No Do you understand care options if your condition(s)  worsen?: Yes-patient verbalized understanding  SDOH Interventions Today    Flowsheet Row Most Recent Value  SDOH Interventions   Food Insecurity Interventions Intervention Not Indicated  Housing Interventions Intervention Not Indicated  Transportation Interventions Intervention Not Indicated, Patient Resources (Friends/Family)  Utilities Interventions Intervention Not Indicated   The patient has been provided with contact information for the care management team and has been advised to call with any health-related questions or concerns. The patient verbalized understanding with current POC. The patient is directed to their insurance card regarding availability of benefits coverage    Cathlean Headland BSN RN [facility] Care Management Coordinator [email or web address] Direct Dial : [phone]  Fax: [phone] Website: [email or web address]

## 2023-12-01 NOTE — Telephone Encounter (Signed)
 Called and spoke to Bendena with [facility] and gave a verbal okay for PT. Dm/cma

## 2023-12-02 DIAGNOSIS — E1142 Type 2 diabetes mellitus with diabetic polyneuropathy: Secondary | ICD-10-CM | POA: Diagnosis not present

## 2023-12-02 DIAGNOSIS — I132 Hypertensive heart and chronic kidney disease with heart failure and with stage 5 chronic kidney disease, or end stage renal disease: Secondary | ICD-10-CM | POA: Diagnosis not present

## 2023-12-02 DIAGNOSIS — Z992 Dependence on renal dialysis: Secondary | ICD-10-CM | POA: Diagnosis not present

## 2023-12-02 DIAGNOSIS — T8619 Other complication of kidney transplant: Secondary | ICD-10-CM | POA: Diagnosis not present

## 2023-12-02 DIAGNOSIS — I480 Paroxysmal atrial fibrillation: Secondary | ICD-10-CM | POA: Diagnosis not present

## 2023-12-02 DIAGNOSIS — E1122 Type 2 diabetes mellitus with diabetic chronic kidney disease: Secondary | ICD-10-CM | POA: Diagnosis not present

## 2023-12-02 DIAGNOSIS — I509 Heart failure, unspecified: Secondary | ICD-10-CM | POA: Diagnosis not present

## 2023-12-02 DIAGNOSIS — N186 End stage renal disease: Secondary | ICD-10-CM | POA: Diagnosis not present

## 2023-12-02 DIAGNOSIS — I2724 Chronic thromboembolic pulmonary hypertension: Secondary | ICD-10-CM | POA: Diagnosis not present

## 2023-12-03 DIAGNOSIS — Z992 Dependence on renal dialysis: Secondary | ICD-10-CM | POA: Diagnosis not present

## 2023-12-03 DIAGNOSIS — N2581 Secondary hyperparathyroidism of renal origin: Secondary | ICD-10-CM | POA: Diagnosis not present

## 2023-12-03 DIAGNOSIS — N186 End stage renal disease: Secondary | ICD-10-CM | POA: Diagnosis not present

## 2023-12-05 DIAGNOSIS — Z992 Dependence on renal dialysis: Secondary | ICD-10-CM | POA: Diagnosis not present

## 2023-12-05 DIAGNOSIS — N2581 Secondary hyperparathyroidism of renal origin: Secondary | ICD-10-CM | POA: Diagnosis not present

## 2023-12-05 DIAGNOSIS — N186 End stage renal disease: Secondary | ICD-10-CM | POA: Diagnosis not present

## 2023-12-07 ENCOUNTER — Ambulatory Visit: Admitting: Family Medicine

## 2023-12-07 ENCOUNTER — Encounter: Payer: Self-pay | Admitting: Family Medicine

## 2023-12-07 VITALS — BP 114/66 | HR 97 | Temp 97.9°F | Ht 74.0 in | Wt 291.8 lb

## 2023-12-07 DIAGNOSIS — Z4802 Encounter for removal of sutures: Secondary | ICD-10-CM

## 2023-12-07 DIAGNOSIS — I509 Heart failure, unspecified: Secondary | ICD-10-CM | POA: Diagnosis not present

## 2023-12-07 DIAGNOSIS — R5381 Other malaise: Secondary | ICD-10-CM | POA: Diagnosis not present

## 2023-12-07 DIAGNOSIS — Z9889 Other specified postprocedural states: Secondary | ICD-10-CM | POA: Diagnosis not present

## 2023-12-07 DIAGNOSIS — Z992 Dependence on renal dialysis: Secondary | ICD-10-CM

## 2023-12-07 DIAGNOSIS — E1129 Type 2 diabetes mellitus with other diabetic kidney complication: Secondary | ICD-10-CM | POA: Diagnosis not present

## 2023-12-07 DIAGNOSIS — I2724 Chronic thromboembolic pulmonary hypertension: Secondary | ICD-10-CM | POA: Diagnosis not present

## 2023-12-07 DIAGNOSIS — M86679 Other chronic osteomyelitis, unspecified ankle and foot: Secondary | ICD-10-CM | POA: Diagnosis not present

## 2023-12-07 DIAGNOSIS — I480 Paroxysmal atrial fibrillation: Secondary | ICD-10-CM | POA: Diagnosis not present

## 2023-12-07 DIAGNOSIS — E1122 Type 2 diabetes mellitus with diabetic chronic kidney disease: Secondary | ICD-10-CM | POA: Diagnosis not present

## 2023-12-07 DIAGNOSIS — N186 End stage renal disease: Secondary | ICD-10-CM

## 2023-12-07 DIAGNOSIS — N041 Nephrotic syndrome with focal and segmental glomerular lesions: Secondary | ICD-10-CM | POA: Diagnosis not present

## 2023-12-07 DIAGNOSIS — I132 Hypertensive heart and chronic kidney disease with heart failure and with stage 5 chronic kidney disease, or end stage renal disease: Secondary | ICD-10-CM | POA: Diagnosis not present

## 2023-12-07 DIAGNOSIS — E1142 Type 2 diabetes mellitus with diabetic polyneuropathy: Secondary | ICD-10-CM | POA: Diagnosis not present

## 2023-12-07 DIAGNOSIS — T8619 Other complication of kidney transplant: Secondary | ICD-10-CM | POA: Diagnosis not present

## 2023-12-07 NOTE — Assessment & Plan Note (Signed)
 Recent cardiac tamponade. Improved currently.

## 2023-12-07 NOTE — Assessment & Plan Note (Signed)
 Pending MRI scan of the left great toe.

## 2023-12-07 NOTE — Assessment & Plan Note (Signed)
 Stable with normal pulse rate. Continue apixaban  2.5 mg bid.

## 2023-12-07 NOTE — Progress Notes (Signed)
 [facility] [address] [place] [address] Dept: [phone] Dept Fax: [phone]  Office Visit  Subjective:    Patient ID: [name], male    DOB: June 14, 1975, 48 y.o..   MRN: [id number]  Chief Complaint  Patient presents with   Hospitalization Follow-up    Hospital follow up.  Also would like to get stitches removed.    History of Present Illness:  Patient is in today for hospital follow-up. I last saw Mr. Baller in Sept. 2024. Since then, he has had multiple hospitalizations: 10/23-10/28/2025[facility]- Sepsis 12/11-12/15/2024- [facility]- ESRD- Creation of an A-V fistula with HeRo graft 1/8-06/18/2023- [facility]- Thrombosis of vascular prosthetic, HeRo graft placement. 2/16-2/18/2025- Thrombosis of vascular prosthetic, HeRo graft revision 5/5-6/20/2025- ESRD, Hypoxia, bilateral pulmonary edema 5/5- Creation of HeRO graft 5/6- ECMO 5/13- Pulmonary endarterectomy 5/25- Creation of pericardial window  Mr. Delauder has a history of ESRD, prior kidney transplant which failed, and is on chronic hemodialysis three days a week. He primarily follows with his nephrologist/dialysis team. He has had extensive issues with maintaining vascular access.    Mr. Goytia has a history of Type 2 diabetes which no longer requires treatment due to his ESRD. His diabetes has been complicated by ESRD, and peripheral neuropathy with a prior ray amputation oft he right great toe due to osteomyelitis. He currently has chronic osteomyelitis of the left 1st toe. He notes he has an order for an MRI of that foot and knows he needs to call about scheduling for this.  Past Medical History: Patient Active Problem List   Diagnosis Date Noted   Chronic osteomyelitis of left foot (HCC) 12/07/2023   Pericardial effusion 11/10/2023   Physical deconditioning 11/04/2023   S/P pericardial window creation 11/01/2023   History of cardioversion 10/22/2023   CTEPH  (chronic thromboembolic pulmonary hypertension) (HCC) 10/20/2023   DVT of deep femoral vein, left (HCC) 10/15/2023   PTE (pulmonary thromboembolism) (HCC) 10/13/2023   Problem with vascular access in ESRD on dialysis 07/26/2023   Status post amputation of great toe, right (HCC) 02/18/2023   Non-healing wound of lower extremity 01/30/2023   Chronic osteomyelitis of right foot with draining sinus (HCC) 01/30/2023   Chronic anticoagulation 01/28/2023   ESRD on hemodialysis (HCC) 01/28/2023   Melanosis coli 05/20/2022   Grade I internal hemorrhoids 05/20/2022   Polyp of rectum 05/20/2022   Thrombus of venous dialysis catheter, initial encounter (HCC) 07/15/2021   Hyperkalemia 07/15/2021   Other mechanical complication of surgically created arteriovenous shunt, initial encounter (HCC) 04/26/2021   Complication of vascular dialysis catheter 02/18/2021   Peripheral neuropathy 02/06/2021   Secondary hyperparathyroidism of renal origin (HCC) 12/07/2020   Hyperlipidemia 12/06/2020   Hypotension 12/05/2020   CHF (congestive heart failure) (HCC)    Tobacco use    Marijuana use, continuous    Debility 12/13/2018   Medically noncompliant    Genital warts 11/03/2018   Meralgia paresthetica of right side 08/13/2018   Paroxysmal atrial fibrillation (HCC) 05/24/2018   Type 2 diabetes mellitus with other diabetic kidney complication (HCC) 05/24/2018   Essential hypertension 01/25/2017   Morbid obesity (HCC) 01/17/2016   Delayed graft function of kidney transplant due to reason other than ATN or rejection requiring acute dialysis (HCC) 11/02/2012   S/P kidney transplant 11/02/2012   Past Surgical History:  Procedure Laterality Date   ALLOGRAFT APPLICATION Right 02/01/2023   Procedure: ALLOGRAFT [facility];  Surgeon: Silva Juliene SAUNDERS, DPM;  Location: [facility] OR;  Service: Orthopedics/Podiatry;  Laterality: Right;   AMPUTATION Right 12/12/2022   Procedure: AMPUTATION RAY;  Surgeon: Joya Stabs,  DPM;  Location: [facility] OR;  Service: Orthopedics/Podiatry;  Laterality: Right;  surgical team to do local block   APPLICATION OF WOUND VAC Right 02/01/2023   Procedure: APPLICATION OF WOUND VAC;  Surgeon: Silva Juliene SAUNDERS, DPM;  Location: [facility] OR;  Service: Orthopedics/Podiatry;  Laterality: Right;   ARTERIOVENOUS GRAFT PLACEMENT  08/09/2010   Left Thigh Graft by Dr. Medford Blade   AV FISTULA PLACEMENT Right 05/18/2018   Procedure: INSERTION OF ARTERIOVENOUS (AV) GORE-TEX GRAFT Left THIGH;  Surgeon: Sheree Penne Bruckner, MD;  Location: [facility] OR;  Service: Vascular;  Laterality: Right;   AV FISTULA PLACEMENT Right 02/15/2021   Procedure: INSERTION OF ARTERIOVENOUS (AV) GORE-TEX LOOP GRAFT RIGHT THIGH;  Surgeon: Sheree Penne Bruckner, MD;  Location: [facility] OR;  Service: Vascular;  Laterality: Right;   BIOPSY  05/20/2022   Procedure: BIOPSY;  Surgeon: San Sandor GAILS, DO;  Location: [facility];  Service: Gastroenterology;;   CAPD INSERTION N/A 06/13/2022   Procedure: LAPAROSCOPIC INSERTION CONTINUOUS AMBULATORY PERITONEAL DIALYSIS  (CAPD) CATHETER;  Surgeon: Sheree Penne Bruckner, MD;  Location: [facility] OR;  Service: Vascular;  Laterality: N/A;   CAPD REMOVAL  05/08/2011   Procedure: CONTINUOUS AMBULATORY PERITONEAL DIALYSIS  (CAPD) CATHETER REMOVAL;  Surgeon: Elsie JINNY Signs, MD;  Location: [facility] OR;  Service: General;  Laterality: N/A;  Removal of CAPD catheter, Dr. requests to go after 100   CAPD REMOVAL N/A 08/08/2022   Procedure: REMOVAL CONTINUOUS AMBULATORY PERITONEAL DIALYSIS  (CAPD) CATHETER;  Surgeon: Sheree Penne Bruckner, MD;  Location: [facility] OR;  Service: Vascular;  Laterality: N/A;   COLONOSCOPY WITH PROPOFOL  N/A 05/20/2022   Procedure: COLONOSCOPY WITH PROPOFOL ;  Surgeon: San Sandor GAILS, DO;  Location: [facility];  Service: Gastroenterology;  Laterality: N/A;   I & D EXTREMITY Right 12/08/2022   Procedure: IRRIGATION AND DEBRIDEMENT RIGHT FOOT;  Surgeon: Joya Stabs, DPM;   Location: [facility] OR;  Service: Orthopedics/Podiatry;  Laterality: Right;   I & D EXTREMITY Right 02/01/2023   Procedure: IRRIGATION AND DEBRIDEMENT RIGHT FOOT;  Surgeon: Silva Juliene SAUNDERS, DPM;  Location: [facility] OR;  Service: Orthopedics/Podiatry;  Laterality: Right;   INSERTION OF DIALYSIS CATHETER  10/05/2010   Right Femoral Cath insertion by Dr. Redell Door.  Pt ahas had several caths inserted.   INSERTION OF DIALYSIS CATHETER Right 02/15/2021   Procedure: Attempted INSERTION OF RIGHT and Left Internal Jugular DIALYSIS CATHETER, Insertion of Left Femoral Vein Dialysis Catheter;  Surgeon: Sheree Penne Bruckner, MD;  Location: [facility] OR;  Service: Vascular;  Laterality: Right;   INSERTION OF DIALYSIS CATHETER Right 04/26/2021   Procedure: INSERTION OF TUNNELED 55cm PALIDROME PRECISION CHRONIC DIALYSIS CATHETER;  Surgeon: Magda Debby SAILOR, MD;  Location: [facility] OR;  Service: Vascular;  Laterality: Right;   INSERTION OF DIALYSIS CATHETER Left 12/26/2021   Procedure: INSERTION OF TUNNELED  DIALYSIS CATHETER LEFT FEMORAL ARTERY;  Surgeon: Sheree Penne Bruckner, MD;  Location: [facility] OR;  Service: Vascular;  Laterality: Left;   INSERTION OF DIALYSIS CATHETER Left 03/06/2022   Procedure: INSERTION OF DIALYSIS CATHETER;  Surgeon: Sheree Penne Bruckner, MD;  Location: [facility] OR;  Service: Vascular;  Laterality: Left;   INSERTION OF DIALYSIS CATHETER Left 12/31/2022   Procedure: INSERTION OF DIALYSIS CATHETER LEFT GROIN;  Surgeon: Magda Debby SAILOR, MD;  Location: [facility] OR;  Service: Vascular;  Laterality: Left;   IR FLUORO GUIDE CV LINE LEFT  04/23/2022   IR FLUORO GUIDE CV LINE LEFT  01/29/2023   IR FLUORO GUIDE CV LINE LEFT  02/02/2023   IR FLUORO GUIDE CV LINE LEFT  04/02/2023   IR FLUORO GUIDE CV LINE LEFT  07/27/2023   IR FLUORO GUIDE CV LINE RIGHT  05/11/2018   IR FLUORO GUIDE CV LINE RIGHT  07/03/2021   IR FLUORO GUIDE CV LINE RIGHT  07/16/2021   IR PTA ADDL CENTRAL DIALYSIS SEG THRU DIALY CIRCUIT RIGHT Right 07/03/2021    IR PTA AND STENT ADDL CENTRAL DIALY SEG THRU DIALY CIRCUIT LEFT Left 02/02/2023   IR PTA VENOUS EXCEPT DIALYSIS CIRCUIT  07/27/2023   IR RADIOLOGIST EVAL & MGMT  04/08/2023   IR US  GUIDE VASC ACCESS LEFT  04/23/2022   IR US  GUIDE VASC ACCESS LEFT  07/27/2023   IR US  GUIDE VASC ACCESS RIGHT  05/11/2018   IR VENO/EXT/UNI LEFT  04/23/2022   IR VENO/EXT/UNI LEFT  01/29/2023   IR VENOCAVAGRAM IVC  07/16/2021   IR VENOCAVAGRAM IVC  02/02/2023   IR VENOCAVAGRAM IVC  07/27/2023   KIDNEY TRANSPLANT  2014   KNEE ARTHROSCOPY Left    LAPAROSCOPIC LYSIS OF ADHESIONS N/A 06/13/2022   Procedure: LAPAROSCOPIC LYSIS OF ADHESIONS;  Surgeon: Sheree Penne Bruckner, MD;  Location[facility] OR;  Service: Vascular;  Laterality: N/A;   POLYPECTOMY  05/20/2022   Procedure: POLYPECTOMY;  Surgeon: San Sandor GAILS, DO;  Location: [facility];  Service: Gastroenterology;;   THROMBECTOMY W/ EMBOLECTOMY Right 04/26/2021   Procedure: REMOVAL OF RIGHT THIGH ARTERIOVENOUS GORE-TEX GRAFT AND REPAIR OF RIGHT COMMON FEMORAL ARTERY;  Surgeon: Magda Debby SAILOR, MD;  Location: [facility] OR;  Service: Vascular;  Laterality: Right;   UPPER EXTREMITY ANGIOGRAPHY Bilateral 05/13/2018   Procedure: UPPER EXTREMITY ANGIOGRAPHY - bilarteral;  Surgeon: Gretta Bruckner PARAS, MD;  Location: [facility];  Service: Cardiovascular;  Laterality: Bilateral;   Family History  Problem Relation Age of Onset   Diabetes Father    Stroke Father    Stroke Maternal Grandmother    Anesthesia problems Neg Hx    Outpatient Medications Prior to Visit  Medication Sig Dispense Refill   apixaban  (ELIQUIS ) 2.5 MG TABS tablet Take 5 mg by mouth 2 (two) times daily.     atorvastatin  (LIPITOR) 40 MG tablet Take 1 tablet (40 mg total) by mouth daily. 90 tablet 3   b complex-vitamin c-folic acid  (NEPHRO-VITE) 0.8 MG TABS tablet Take 1 tablet by mouth daily.     epoetin  alfa (EPOGEN) 4000 UNIT/ML injection Inject 4,000 Units into the vein 3 (three) times a week.      ferric citrate  (AURYXIA ) 1 GM 210 MG(Fe) tablet Take 210 mg by mouth 3 (three) times daily with meals.     folic acid  (FOLVITE ) 1 MG tablet Take 1 mg by mouth daily.     midodrine  (PROAMATINE ) 10 MG tablet Take 20 mg by mouth 3 (three) times daily.     sodium zirconium cyclosilicate  (LOKELMA ) 10 g PACK packet Take 10 g by mouth See admin instructions. Take 10g by mouth once daily on non-dialysis days     aspirin  EC 81 MG tablet Take 81 mg by mouth daily.     clopidogrel  (PLAVIX ) 75 MG tablet Take 75 mg by mouth daily.     No facility-administered medications prior to visit.   No Known Allergies   Objective:   Today's Vitals   12/07/23 0852  BP: 114/66  Pulse: 97  Temp: 97.9 F (36.6 C)  TempSrc: Temporal  SpO2: 98%  Weight: 291 lb  12.8 oz (132.4 kg)  Height: 6' 2 (1.88 m)   Body mass index is 37.46 kg/m.   General: Well developed, well nourished. No acute distress. Lungs: Clear to auscultation bilaterally. No wheezing, rales or rhonchi. CV: RRR without murmurs or rubs. Pulses 2+ bilaterally. Feet: The right foot skin is mostly intact. Prior amputation site is well healed. The left foot shows a   small chronic wound on the distal end of both the great toe and 2nd toe. There are hammer to   deformities. These is some moderate peelign skin and mild redness and swelling of the great toe. Psych: Alert and oriented. Normal mood and affect.  Health Maintenance Due  Topic Date Due   Hepatitis C Screening  Never done   DTaP/Tdap/Td (1 - Tdap) Never done   Hepatitis B Vaccines (1 of 3 - 19+ 3-dose series) Never done   Pneumococcal Vaccine 68-27 Years old (3 of 3 - PCV20 or PCV21) 04/08/2017   HEMOGLOBIN A1C  08/18/2023   Medicare Annual Wellness (AWV)  12/29/2023   PROCEDURE- Suture Removal Indication: Post-operative wounds Anesthesia: Local, None  PARQ reviewed with patient. verbal consent obtained. Used suture scissors and forceps to remove multiple interrupted sutures at  surgical sites, including the left upper chest, the epigastrium, and both the left and right inguinal area. Patient tolerated procedure well. Routine wound care instructions reviewed and provided to patient.     Assessment & Plan:   Problem List Items Addressed This Visit       Cardiovascular and Mediastinum   CTEPH (chronic thromboembolic pulmonary hypertension) (HCC) - Primary   Recent pulmonary emboli. Continue apixaban  2.5 mg bid.      Relevant Medications   midodrine  (PROAMATINE ) 10 MG tablet   Paroxysmal atrial fibrillation (HCC)   Stable with normal pulse rate. Continue apixaban  2.5 mg bid.      Relevant Medications   midodrine  (PROAMATINE ) 10 MG tablet     Endocrine   Type 2 diabetes mellitus with other diabetic kidney complication (HCC)   A1C of 5.2 in 02/2023. Continue dietary management.         Musculoskeletal and Integument   Chronic osteomyelitis of left foot (HCC)   Pending MRI scan of the left great toe.        Genitourinary   ESRD on hemodialysis (HCC)   Continue to work with nephrology and dialysis team. Vascular access remains a significant issue.        Other   Debility   Working with home health PT to regain strength.      S/P pericardial window creation   Recent cardiac tamponade. Improved currently.       Return in about 3 months (around 03/08/2024) for Reassessment.   Garnette CHRISTELLA Simpler, MD

## 2023-12-07 NOTE — Assessment & Plan Note (Signed)
 A1C of 5.2 in 02/2023. Continue dietary management.

## 2023-12-07 NOTE — Assessment & Plan Note (Addendum)
 Continue to work with nephrology and dialysis team. Vascular access remains a significant issue.

## 2023-12-07 NOTE — Assessment & Plan Note (Signed)
 Working with home health PT to regain strength.

## 2023-12-07 NOTE — Assessment & Plan Note (Signed)
 Recent pulmonary emboli. Continue apixaban  2.5 mg bid.

## 2023-12-08 DIAGNOSIS — D509 Iron deficiency anemia, unspecified: Secondary | ICD-10-CM | POA: Diagnosis not present

## 2023-12-08 DIAGNOSIS — D689 Coagulation defect, unspecified: Secondary | ICD-10-CM | POA: Diagnosis not present

## 2023-12-08 DIAGNOSIS — N2581 Secondary hyperparathyroidism of renal origin: Secondary | ICD-10-CM | POA: Diagnosis not present

## 2023-12-08 DIAGNOSIS — N186 End stage renal disease: Secondary | ICD-10-CM | POA: Diagnosis not present

## 2023-12-08 DIAGNOSIS — D631 Anemia in chronic kidney disease: Secondary | ICD-10-CM | POA: Diagnosis not present

## 2023-12-08 DIAGNOSIS — Z992 Dependence on renal dialysis: Secondary | ICD-10-CM | POA: Diagnosis not present

## 2023-12-09 ENCOUNTER — Telehealth: Payer: Self-pay

## 2023-12-09 DIAGNOSIS — I82412 Acute embolism and thrombosis of left femoral vein: Secondary | ICD-10-CM | POA: Diagnosis not present

## 2023-12-09 DIAGNOSIS — I272 Pulmonary hypertension, unspecified: Secondary | ICD-10-CM | POA: Diagnosis not present

## 2023-12-09 DIAGNOSIS — Z89411 Acquired absence of right great toe: Secondary | ICD-10-CM | POA: Diagnosis not present

## 2023-12-09 DIAGNOSIS — D631 Anemia in chronic kidney disease: Secondary | ICD-10-CM | POA: Diagnosis not present

## 2023-12-09 DIAGNOSIS — E1122 Type 2 diabetes mellitus with diabetic chronic kidney disease: Secondary | ICD-10-CM | POA: Diagnosis not present

## 2023-12-09 DIAGNOSIS — M86672 Other chronic osteomyelitis, left ankle and foot: Secondary | ICD-10-CM | POA: Diagnosis not present

## 2023-12-09 DIAGNOSIS — Z7901 Long term (current) use of anticoagulants: Secondary | ICD-10-CM | POA: Diagnosis not present

## 2023-12-09 DIAGNOSIS — E1169 Type 2 diabetes mellitus with other specified complication: Secondary | ICD-10-CM | POA: Diagnosis not present

## 2023-12-09 DIAGNOSIS — N186 End stage renal disease: Secondary | ICD-10-CM | POA: Diagnosis not present

## 2023-12-09 DIAGNOSIS — E785 Hyperlipidemia, unspecified: Secondary | ICD-10-CM | POA: Diagnosis not present

## 2023-12-09 DIAGNOSIS — G4700 Insomnia, unspecified: Secondary | ICD-10-CM | POA: Diagnosis not present

## 2023-12-09 DIAGNOSIS — E1142 Type 2 diabetes mellitus with diabetic polyneuropathy: Secondary | ICD-10-CM | POA: Diagnosis not present

## 2023-12-09 DIAGNOSIS — I2699 Other pulmonary embolism without acute cor pulmonale: Secondary | ICD-10-CM | POA: Diagnosis not present

## 2023-12-09 DIAGNOSIS — M86671 Other chronic osteomyelitis, right ankle and foot: Secondary | ICD-10-CM | POA: Diagnosis not present

## 2023-12-09 DIAGNOSIS — Z992 Dependence on renal dialysis: Secondary | ICD-10-CM | POA: Diagnosis not present

## 2023-12-09 DIAGNOSIS — I959 Hypotension, unspecified: Secondary | ICD-10-CM | POA: Diagnosis not present

## 2023-12-09 DIAGNOSIS — E46 Unspecified protein-calorie malnutrition: Secondary | ICD-10-CM | POA: Diagnosis not present

## 2023-12-09 DIAGNOSIS — I509 Heart failure, unspecified: Secondary | ICD-10-CM | POA: Diagnosis not present

## 2023-12-09 DIAGNOSIS — Z940 Kidney transplant status: Secondary | ICD-10-CM | POA: Diagnosis not present

## 2023-12-09 DIAGNOSIS — E039 Hypothyroidism, unspecified: Secondary | ICD-10-CM | POA: Diagnosis not present

## 2023-12-09 DIAGNOSIS — I132 Hypertensive heart and chronic kidney disease with heart failure and with stage 5 chronic kidney disease, or end stage renal disease: Secondary | ICD-10-CM | POA: Diagnosis not present

## 2023-12-09 DIAGNOSIS — I4891 Unspecified atrial fibrillation: Secondary | ICD-10-CM | POA: Diagnosis not present

## 2023-12-09 NOTE — Telephone Encounter (Signed)
 Copied from CRM [id number]. Topic: Clinical - Home Health Verbal Orders >> Dec 09, 2023 10:52 AM Franky GRADE wrote: Caller/Agency:Brian/ [facility] Callback Number: [phone] Service Requested: Physical Therapy Frequency: 1 time a week for 9 weeks.  Any new concerns about the patient? No

## 2023-12-09 NOTE — Telephone Encounter (Signed)
 Okay given for PT orders. Dm/cma

## 2023-12-09 NOTE — Telephone Encounter (Signed)
Left VM to rtn call. Dm/cma       

## 2023-12-10 DIAGNOSIS — D689 Coagulation defect, unspecified: Secondary | ICD-10-CM | POA: Diagnosis not present

## 2023-12-10 DIAGNOSIS — N2581 Secondary hyperparathyroidism of renal origin: Secondary | ICD-10-CM | POA: Diagnosis not present

## 2023-12-10 DIAGNOSIS — Z992 Dependence on renal dialysis: Secondary | ICD-10-CM | POA: Diagnosis not present

## 2023-12-10 DIAGNOSIS — N186 End stage renal disease: Secondary | ICD-10-CM | POA: Diagnosis not present

## 2023-12-10 DIAGNOSIS — D631 Anemia in chronic kidney disease: Secondary | ICD-10-CM | POA: Diagnosis not present

## 2023-12-10 DIAGNOSIS — D509 Iron deficiency anemia, unspecified: Secondary | ICD-10-CM | POA: Diagnosis not present

## 2023-12-12 DIAGNOSIS — Z992 Dependence on renal dialysis: Secondary | ICD-10-CM | POA: Diagnosis not present

## 2023-12-12 DIAGNOSIS — N186 End stage renal disease: Secondary | ICD-10-CM | POA: Diagnosis not present

## 2023-12-12 DIAGNOSIS — N2581 Secondary hyperparathyroidism of renal origin: Secondary | ICD-10-CM | POA: Diagnosis not present

## 2023-12-12 DIAGNOSIS — D509 Iron deficiency anemia, unspecified: Secondary | ICD-10-CM | POA: Diagnosis not present

## 2023-12-12 DIAGNOSIS — D631 Anemia in chronic kidney disease: Secondary | ICD-10-CM | POA: Diagnosis not present

## 2023-12-12 DIAGNOSIS — D689 Coagulation defect, unspecified: Secondary | ICD-10-CM | POA: Diagnosis not present

## 2023-12-15 ENCOUNTER — Telehealth: Payer: Self-pay | Admitting: Family Medicine

## 2023-12-15 DIAGNOSIS — I469 Cardiac arrest, cause unspecified: Secondary | ICD-10-CM | POA: Diagnosis not present

## 2023-12-17 ENCOUNTER — Telehealth: Payer: Self-pay | Admitting: Family Medicine

## 2023-12-17 NOTE — Telephone Encounter (Signed)
 Error

## 2023-12-17 NOTE — Telephone Encounter (Signed)
 Patient dropped off document Home Health Certificate (Order ID [id number]), to be filled out by provider. Patient requested to send it back via Fax within 7-days. Document is located in providers tray at front office.Please advise at [phone]

## 2023-12-17 NOTE — Telephone Encounter (Signed)
 Form filled out and faxed back to [phone]. Dm/cma

## 2024-01-08 NOTE — Telephone Encounter (Signed)
 Patient dropped off document Home Health Certificate (Order ID [id number]), to be filled out by provider. Patient requested to send it back via Fax within 7-days. Document is located in providers tray at front office.Please advise at [phone]

## 2024-01-08 NOTE — Telephone Encounter (Signed)
 Form filled out/faxed to [facility] @ [phone].  Then given to Kristie for billing purposes. Dm/cma

## 2024-01-08 DEATH — deceased

## 2024-03-07 ENCOUNTER — Ambulatory Visit: Admitting: Family Medicine
